# Patient Record
Sex: Female | Born: 1935
Health system: Southern US, Community
[De-identification: ages and names within clinical notes are randomized; demographics above are authoritative.]

## PROBLEM LIST (undated history)

## (undated) DIAGNOSIS — K5289 Other specified noninfective gastroenteritis and colitis: Secondary | ICD-10-CM

## (undated) DIAGNOSIS — F419 Anxiety disorder, unspecified: Secondary | ICD-10-CM

## (undated) DIAGNOSIS — E7800 Pure hypercholesterolemia, unspecified: Secondary | ICD-10-CM

## (undated) DIAGNOSIS — K222 Esophageal obstruction: Secondary | ICD-10-CM

## (undated) DIAGNOSIS — K2990 Gastroduodenitis, unspecified, without bleeding: Secondary | ICD-10-CM

## (undated) DIAGNOSIS — M1990 Unspecified osteoarthritis, unspecified site: Secondary | ICD-10-CM

## (undated) DIAGNOSIS — Z95810 Presence of automatic (implantable) cardiac defibrillator: Secondary | ICD-10-CM

## (undated) DIAGNOSIS — N200 Calculus of kidney: Secondary | ICD-10-CM

## (undated) DIAGNOSIS — R55 Syncope and collapse: Secondary | ICD-10-CM

## (undated) DIAGNOSIS — I739 Peripheral vascular disease, unspecified: Secondary | ICD-10-CM

## (undated) DIAGNOSIS — K644 Residual hemorrhoidal skin tags: Secondary | ICD-10-CM

## (undated) DIAGNOSIS — K2970 Gastritis, unspecified, without bleeding: Secondary | ICD-10-CM

## (undated) DIAGNOSIS — T7840XA Allergy, unspecified, initial encounter: Secondary | ICD-10-CM

## (undated) DIAGNOSIS — H269 Unspecified cataract: Secondary | ICD-10-CM

## (undated) DIAGNOSIS — I7143 Infrarenal abdominal aortic aneurysm, without rupture: Secondary | ICD-10-CM

## (undated) DIAGNOSIS — R001 Bradycardia, unspecified: Secondary | ICD-10-CM

## (undated) DIAGNOSIS — K209 Esophagitis, unspecified without bleeding: Secondary | ICD-10-CM

## (undated) DIAGNOSIS — M549 Dorsalgia, unspecified: Secondary | ICD-10-CM

## (undated) DIAGNOSIS — I4891 Unspecified atrial fibrillation: Secondary | ICD-10-CM

## (undated) DIAGNOSIS — K449 Diaphragmatic hernia without obstruction or gangrene: Secondary | ICD-10-CM

## (undated) DIAGNOSIS — I82409 Acute embolism and thrombosis of unspecified deep veins of unspecified lower extremity: Secondary | ICD-10-CM

## (undated) DIAGNOSIS — M129 Arthropathy, unspecified: Secondary | ICD-10-CM

## (undated) DIAGNOSIS — E119 Type 2 diabetes mellitus without complications: Secondary | ICD-10-CM

## (undated) DIAGNOSIS — H3530 Unspecified macular degeneration: Secondary | ICD-10-CM

## (undated) DIAGNOSIS — K5730 Diverticulosis of large intestine without perforation or abscess without bleeding: Secondary | ICD-10-CM

## (undated) DIAGNOSIS — I219 Acute myocardial infarction, unspecified: Secondary | ICD-10-CM

## (undated) DIAGNOSIS — I951 Orthostatic hypotension: Secondary | ICD-10-CM

## (undated) DIAGNOSIS — I200 Unstable angina: Secondary | ICD-10-CM

## (undated) DIAGNOSIS — D689 Coagulation defect, unspecified: Secondary | ICD-10-CM

## (undated) DIAGNOSIS — N183 Chronic kidney disease, stage 3 unspecified: Secondary | ICD-10-CM

## (undated) DIAGNOSIS — I10 Essential (primary) hypertension: Secondary | ICD-10-CM

## (undated) DIAGNOSIS — I2510 Atherosclerotic heart disease of native coronary artery without angina pectoris: Secondary | ICD-10-CM

## (undated) DIAGNOSIS — K219 Gastro-esophageal reflux disease without esophagitis: Secondary | ICD-10-CM

## (undated) DIAGNOSIS — K625 Hemorrhage of anus and rectum: Secondary | ICD-10-CM

## (undated) DIAGNOSIS — Z8719 Personal history of other diseases of the digestive system: Secondary | ICD-10-CM

## (undated) DIAGNOSIS — G8929 Other chronic pain: Secondary | ICD-10-CM

## (undated) HISTORY — DX: Esophagitis, unspecified without bleeding: K20.90

## (undated) HISTORY — DX: Unspecified cataract: H26.9

## (undated) HISTORY — DX: Allergy, unspecified, initial encounter: T78.40XA

## (undated) HISTORY — DX: Esophagitis, unspecified: K20.9

## (undated) HISTORY — DX: Anxiety disorder, unspecified: F41.9

## (undated) HISTORY — PX: CATARACT EXTRACTION: SUR2

## (undated) HISTORY — DX: Gastroduodenitis, unspecified, without bleeding: K29.90

## (undated) HISTORY — DX: Coagulation defect, unspecified: D68.9

## (undated) HISTORY — DX: Diaphragmatic hernia without obstruction or gangrene: K44.9

## (undated) HISTORY — DX: Chronic kidney disease, stage 3 (moderate): N18.3

## (undated) HISTORY — DX: Type 2 diabetes mellitus without complications: E11.9

## (undated) HISTORY — DX: Gastritis, unspecified, without bleeding: K29.70

## (undated) HISTORY — DX: Diverticulosis of large intestine without perforation or abscess without bleeding: K57.30

## (undated) HISTORY — PX: CARDIAC CATHETERIZATION: SHX172

## (undated) HISTORY — DX: Esophageal obstruction: K22.2

## (undated) HISTORY — PX: HEMORROIDECTOMY: SUR656

## (undated) HISTORY — DX: Chronic kidney disease, stage 3 unspecified: N18.30

## (undated) HISTORY — PX: COLONOSCOPY: SHX174

## (undated) HISTORY — PX: OTHER SURGICAL HISTORY: SHX169

## (undated) HISTORY — DX: Unspecified atrial fibrillation: I48.91

---
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
## 1991-01-17 DIAGNOSIS — I219 Acute myocardial infarction, unspecified: Secondary | ICD-10-CM

## 1991-01-17 HISTORY — DX: Acute myocardial infarction, unspecified: I21.9

## 1998-07-09 ENCOUNTER — Other Ambulatory Visit: Admission: RE | Admit: 1998-07-09 | Discharge: 1998-07-09 | Payer: Self-pay | Admitting: Family Medicine

## 2000-04-20 ENCOUNTER — Encounter: Payer: Self-pay | Admitting: Cardiovascular Disease

## 2000-04-20 ENCOUNTER — Inpatient Hospital Stay (HOSPITAL_COMMUNITY): Admission: EM | Admit: 2000-04-20 | Discharge: 2000-04-23 | Payer: Self-pay | Admitting: Emergency Medicine

## 2000-09-26 ENCOUNTER — Other Ambulatory Visit: Admission: RE | Admit: 2000-09-26 | Discharge: 2000-09-26 | Payer: Self-pay | Admitting: Family Medicine

## 2001-06-12 ENCOUNTER — Encounter: Payer: Self-pay | Admitting: Gastroenterology

## 2001-06-12 ENCOUNTER — Encounter (INDEPENDENT_AMBULATORY_CARE_PROVIDER_SITE_OTHER): Payer: Self-pay

## 2001-06-12 ENCOUNTER — Ambulatory Visit (HOSPITAL_COMMUNITY): Admission: RE | Admit: 2001-06-12 | Discharge: 2001-06-12 | Payer: Self-pay | Admitting: Gastroenterology

## 2001-06-27 ENCOUNTER — Ambulatory Visit (HOSPITAL_COMMUNITY): Admission: RE | Admit: 2001-06-27 | Discharge: 2001-06-27 | Payer: Self-pay | Admitting: Cardiovascular Disease

## 2001-06-27 ENCOUNTER — Encounter: Payer: Self-pay | Admitting: Cardiovascular Disease

## 2001-07-29 ENCOUNTER — Ambulatory Visit (HOSPITAL_COMMUNITY): Admission: RE | Admit: 2001-07-29 | Discharge: 2001-07-30 | Payer: Self-pay | Admitting: Cardiovascular Disease

## 2001-08-21 ENCOUNTER — Ambulatory Visit (HOSPITAL_COMMUNITY): Admission: RE | Admit: 2001-08-21 | Discharge: 2001-08-21 | Payer: Self-pay | Admitting: Internal Medicine

## 2001-09-18 ENCOUNTER — Other Ambulatory Visit: Admission: RE | Admit: 2001-09-18 | Discharge: 2001-09-18 | Payer: Self-pay | Admitting: Family Medicine

## 2001-09-23 ENCOUNTER — Encounter: Payer: Self-pay | Admitting: Family Medicine

## 2001-09-23 ENCOUNTER — Ambulatory Visit (HOSPITAL_COMMUNITY): Admission: RE | Admit: 2001-09-23 | Discharge: 2001-09-23 | Payer: Self-pay | Admitting: Family Medicine

## 2002-06-03 ENCOUNTER — Encounter: Payer: Self-pay | Admitting: Cardiovascular Disease

## 2002-06-03 ENCOUNTER — Ambulatory Visit (HOSPITAL_COMMUNITY): Admission: RE | Admit: 2002-06-03 | Discharge: 2002-06-03 | Payer: Self-pay | Admitting: Cardiovascular Disease

## 2002-09-09 ENCOUNTER — Ambulatory Visit (HOSPITAL_COMMUNITY): Admission: RE | Admit: 2002-09-09 | Discharge: 2002-09-09 | Payer: Self-pay | Admitting: Gastroenterology

## 2002-09-09 ENCOUNTER — Encounter: Payer: Self-pay | Admitting: Gastroenterology

## 2002-10-28 ENCOUNTER — Emergency Department (HOSPITAL_COMMUNITY): Admission: EM | Admit: 2002-10-28 | Discharge: 2002-10-28 | Payer: Self-pay | Admitting: Emergency Medicine

## 2002-10-29 ENCOUNTER — Encounter: Payer: Self-pay | Admitting: Cardiovascular Disease

## 2002-10-29 ENCOUNTER — Inpatient Hospital Stay (HOSPITAL_COMMUNITY): Admission: AD | Admit: 2002-10-29 | Discharge: 2002-10-31 | Payer: Self-pay | Admitting: Cardiovascular Disease

## 2002-12-29 ENCOUNTER — Inpatient Hospital Stay (HOSPITAL_COMMUNITY): Admission: EM | Admit: 2002-12-29 | Discharge: 2003-01-03 | Payer: Self-pay | Admitting: *Deleted

## 2003-11-20 ENCOUNTER — Other Ambulatory Visit: Admission: RE | Admit: 2003-11-20 | Discharge: 2003-11-20 | Payer: Self-pay | Admitting: Family Medicine

## 2003-11-27 ENCOUNTER — Ambulatory Visit (HOSPITAL_COMMUNITY): Admission: RE | Admit: 2003-11-27 | Discharge: 2003-11-28 | Payer: Self-pay | Admitting: Orthopedic Surgery

## 2004-11-08 ENCOUNTER — Ambulatory Visit: Payer: Self-pay | Admitting: Gastroenterology

## 2004-11-09 ENCOUNTER — Ambulatory Visit: Payer: Self-pay | Admitting: Gastroenterology

## 2004-11-22 ENCOUNTER — Ambulatory Visit (HOSPITAL_COMMUNITY): Admission: RE | Admit: 2004-11-22 | Discharge: 2004-11-22 | Payer: Self-pay | Admitting: Gastroenterology

## 2004-11-29 ENCOUNTER — Ambulatory Visit: Payer: Self-pay | Admitting: Gastroenterology

## 2005-01-16 DIAGNOSIS — K5730 Diverticulosis of large intestine without perforation or abscess without bleeding: Secondary | ICD-10-CM

## 2005-01-16 HISTORY — DX: Diverticulosis of large intestine without perforation or abscess without bleeding: K57.30

## 2005-09-06 ENCOUNTER — Ambulatory Visit: Payer: Self-pay | Admitting: Gastroenterology

## 2005-09-20 ENCOUNTER — Ambulatory Visit: Payer: Self-pay | Admitting: Gastroenterology

## 2005-09-20 ENCOUNTER — Encounter (INDEPENDENT_AMBULATORY_CARE_PROVIDER_SITE_OTHER): Payer: Self-pay | Admitting: *Deleted

## 2005-10-31 ENCOUNTER — Ambulatory Visit: Payer: Self-pay | Admitting: Gastroenterology

## 2005-12-18 ENCOUNTER — Other Ambulatory Visit: Admission: RE | Admit: 2005-12-18 | Discharge: 2005-12-18 | Payer: Self-pay | Admitting: Family Medicine

## 2006-11-28 ENCOUNTER — Inpatient Hospital Stay (HOSPITAL_COMMUNITY): Admission: EM | Admit: 2006-11-28 | Discharge: 2006-12-01 | Payer: Self-pay | Admitting: *Deleted

## 2006-11-28 ENCOUNTER — Ambulatory Visit: Payer: Self-pay | Admitting: Internal Medicine

## 2007-01-08 ENCOUNTER — Ambulatory Visit: Payer: Self-pay | Admitting: Internal Medicine

## 2007-02-05 ENCOUNTER — Ambulatory Visit: Payer: Self-pay | Admitting: Internal Medicine

## 2007-02-05 ENCOUNTER — Inpatient Hospital Stay (HOSPITAL_COMMUNITY): Admission: RE | Admit: 2007-02-05 | Discharge: 2007-02-06 | Payer: Self-pay | Admitting: Internal Medicine

## 2007-02-05 HISTORY — PX: CARDIAC DEFIBRILLATOR PLACEMENT: SHX171

## 2007-02-20 ENCOUNTER — Ambulatory Visit: Payer: Self-pay

## 2007-03-20 ENCOUNTER — Ambulatory Visit: Payer: Self-pay | Admitting: Internal Medicine

## 2007-04-04 ENCOUNTER — Ambulatory Visit: Payer: Self-pay

## 2007-05-09 ENCOUNTER — Ambulatory Visit: Payer: Self-pay | Admitting: Internal Medicine

## 2007-07-29 ENCOUNTER — Encounter: Admission: RE | Admit: 2007-07-29 | Discharge: 2007-07-29 | Payer: Self-pay | Admitting: General Surgery

## 2007-11-06 ENCOUNTER — Telehealth: Payer: Self-pay | Admitting: Gastroenterology

## 2007-12-02 DIAGNOSIS — K5289 Other specified noninfective gastroenteritis and colitis: Secondary | ICD-10-CM

## 2007-12-02 DIAGNOSIS — K209 Esophagitis, unspecified without bleeding: Secondary | ICD-10-CM | POA: Insufficient documentation

## 2007-12-02 DIAGNOSIS — K2970 Gastritis, unspecified, without bleeding: Secondary | ICD-10-CM | POA: Insufficient documentation

## 2007-12-02 DIAGNOSIS — E1169 Type 2 diabetes mellitus with other specified complication: Secondary | ICD-10-CM | POA: Insufficient documentation

## 2007-12-02 DIAGNOSIS — K644 Residual hemorrhoidal skin tags: Secondary | ICD-10-CM | POA: Insufficient documentation

## 2007-12-02 DIAGNOSIS — I219 Acute myocardial infarction, unspecified: Secondary | ICD-10-CM | POA: Insufficient documentation

## 2007-12-02 DIAGNOSIS — K2990 Gastroduodenitis, unspecified, without bleeding: Secondary | ICD-10-CM

## 2007-12-02 DIAGNOSIS — K5730 Diverticulosis of large intestine without perforation or abscess without bleeding: Secondary | ICD-10-CM | POA: Insufficient documentation

## 2007-12-02 DIAGNOSIS — M129 Arthropathy, unspecified: Secondary | ICD-10-CM | POA: Insufficient documentation

## 2007-12-02 DIAGNOSIS — E7800 Pure hypercholesterolemia, unspecified: Secondary | ICD-10-CM

## 2007-12-02 HISTORY — DX: Other specified noninfective gastroenteritis and colitis: K52.89

## 2007-12-03 ENCOUNTER — Ambulatory Visit: Payer: Self-pay | Admitting: Gastroenterology

## 2007-12-03 DIAGNOSIS — R1013 Epigastric pain: Secondary | ICD-10-CM

## 2007-12-03 DIAGNOSIS — K625 Hemorrhage of anus and rectum: Secondary | ICD-10-CM

## 2007-12-03 DIAGNOSIS — K580 Irritable bowel syndrome with diarrhea: Secondary | ICD-10-CM | POA: Insufficient documentation

## 2007-12-03 DIAGNOSIS — I259 Chronic ischemic heart disease, unspecified: Secondary | ICD-10-CM | POA: Insufficient documentation

## 2007-12-03 HISTORY — DX: Hemorrhage of anus and rectum: K62.5

## 2007-12-03 LAB — CONVERTED CEMR LAB
ALT: 31 units/L (ref 0–35)
Albumin: 3.9 g/dL (ref 3.5–5.2)
Amylase: 93 units/L (ref 27–131)
BUN: 15 mg/dL (ref 6–23)
CO2: 29 meq/L (ref 19–32)
Calcium: 9.3 mg/dL (ref 8.4–10.5)
Chloride: 103 meq/L (ref 96–112)
Creatinine, Ser: 1.2 mg/dL (ref 0.4–1.2)
Ferritin: 73.8 ng/mL (ref 10.0–291.0)
GFR calc Af Amer: 56 mL/min
Glucose, Bld: 94 mg/dL (ref 70–99)
HCT: 38.6 % (ref 36.0–46.0)
Hemoglobin: 13.4 g/dL (ref 12.0–15.0)
Iron: 77 ug/dL (ref 42–145)
Lymphocytes Relative: 30.8 % (ref 12.0–46.0)
Monocytes Absolute: 0.5 10*3/uL (ref 0.1–1.0)
Neutro Abs: 2.9 10*3/uL (ref 1.4–7.7)
Neutrophils Relative %: 49.7 % (ref 43.0–77.0)
Platelets: 231 10*3/uL (ref 150–400)
RBC: 4.05 M/uL (ref 3.87–5.11)
Sed Rate: 24 mm/hr — ABNORMAL HIGH (ref 0–22)
Sodium: 139 meq/L (ref 135–145)

## 2007-12-04 ENCOUNTER — Encounter: Payer: Self-pay | Admitting: Gastroenterology

## 2007-12-04 ENCOUNTER — Ambulatory Visit: Payer: Self-pay | Admitting: Gastroenterology

## 2007-12-04 ENCOUNTER — Telehealth: Payer: Self-pay | Admitting: Gastroenterology

## 2007-12-09 ENCOUNTER — Encounter: Payer: Self-pay | Admitting: Gastroenterology

## 2008-01-02 ENCOUNTER — Encounter: Payer: Self-pay | Admitting: Gastroenterology

## 2008-01-04 ENCOUNTER — Encounter: Payer: Self-pay | Admitting: Gastroenterology

## 2008-01-06 ENCOUNTER — Encounter: Payer: Self-pay | Admitting: Gastroenterology

## 2008-01-23 ENCOUNTER — Encounter: Admission: RE | Admit: 2008-01-23 | Discharge: 2008-01-23 | Payer: Self-pay | Admitting: General Surgery

## 2008-03-04 ENCOUNTER — Encounter: Payer: Self-pay | Admitting: Internal Medicine

## 2008-03-06 ENCOUNTER — Telehealth: Payer: Self-pay | Admitting: Gastroenterology

## 2008-07-07 ENCOUNTER — Encounter: Admission: RE | Admit: 2008-07-07 | Discharge: 2008-07-07 | Payer: Self-pay | Admitting: Family Medicine

## 2008-07-21 ENCOUNTER — Encounter: Payer: Self-pay | Admitting: Internal Medicine

## 2008-08-05 ENCOUNTER — Encounter: Payer: Self-pay | Admitting: Internal Medicine

## 2009-04-05 ENCOUNTER — Encounter: Admission: RE | Admit: 2009-04-05 | Discharge: 2009-04-05 | Payer: Self-pay | Admitting: Cardiovascular Disease

## 2009-07-12 ENCOUNTER — Encounter: Admission: RE | Admit: 2009-07-12 | Discharge: 2009-07-12 | Payer: Self-pay | Admitting: Family Medicine

## 2009-08-17 ENCOUNTER — Telehealth: Payer: Self-pay | Admitting: Gastroenterology

## 2009-09-10 ENCOUNTER — Telehealth: Payer: Self-pay | Admitting: Gastroenterology

## 2009-12-06 ENCOUNTER — Ambulatory Visit: Payer: Self-pay | Admitting: Internal Medicine

## 2009-12-06 ENCOUNTER — Inpatient Hospital Stay (HOSPITAL_COMMUNITY): Admission: EM | Admit: 2009-12-06 | Discharge: 2009-12-08 | Payer: Self-pay | Admitting: Emergency Medicine

## 2009-12-27 ENCOUNTER — Encounter: Payer: Self-pay | Admitting: Gastroenterology

## 2010-01-04 ENCOUNTER — Encounter (INDEPENDENT_AMBULATORY_CARE_PROVIDER_SITE_OTHER): Payer: Self-pay | Admitting: *Deleted

## 2010-01-24 ENCOUNTER — Telehealth (INDEPENDENT_AMBULATORY_CARE_PROVIDER_SITE_OTHER): Payer: Self-pay | Admitting: *Deleted

## 2010-02-15 NOTE — Progress Notes (Signed)
Summary: ? re: recall col  Phone Note From Other Clinic Call back at [phone]   Caller: Joaquim Lai from Dr Evelene Croon office Call For: Dr Sharlett Iles Summary of Call: Dr Laurance Flatten wants to know when is this patient due for recall co, last one was sone in '07.l  Initial call taken by: Ronalee Red,  August 17, 2009 10:50 AM  Follow-up for Phone Call        Joaquim Lai notified that Dr. Sharlett Iles will not be available until next week to review chart.  Colon and path report in EMR. Follow-up by: Alberteen Spindle RN,  August 17, 2009 11:50 AM  Additional Follow-up for Phone Call Additional follow up Details #1::        only as needed Additional Follow-up by: Sable Feil MD [facility],  August 23, 2009 12:52 PM    Additional Follow-up for Phone Call Additional follow up Details #2::    Mitzi at Dr. Tawanna Sat office notified. Follow-up by: Alberteen Spindle RN,  August 24, 2009 9:43 AM

## 2010-02-15 NOTE — Progress Notes (Signed)
Summary: Refill  Phone Note From Pharmacy   Caller: [facility] Summary of Call: Refill requested on Omeprazole Initial call taken by: Alberteen Spindle RN,  September 10, 2009 4:28 PM  Follow-up for Phone Call        Faxed. Follow-up by: Alberteen Spindle RN,  September 10, 2009 4:29 PM    New/Updated Medications: OMEPRAZOLE 40 MG  CPDR (OMEPRAZOLE) 1 each day 30 minutes before meal Prescriptions: OMEPRAZOLE 40 MG  CPDR (OMEPRAZOLE) 1 each day 30 minutes before meal  #30 x 11   Entered by:   Alberteen Spindle RN   Authorized by:   Sable Feil MD [facility]   Signed by:   Alberteen Spindle RN on 09/10/2009   Method used:   Faxed to ...       [facility]       [address] [place], [place]  [address]       Ph: [phone] or [phone]       Fax: [phone]   RxID:   [id number]

## 2010-02-17 NOTE — Progress Notes (Signed)
Summary: pt calling re letter  Phone Note Call from Patient   Caller: Patient Reason for Call: Talk to Nurse Summary of Call: pt CALLING in response to delquient letter re device ck-pt being checked be dr Rollene Fare Initial call taken by: Lorenda Hatchet,  January 24, 2010 2:27 PM  Follow-up for Phone Call        Now follows with Dr Rollene Fare     Follow-up by: Gasper Sells, EMT,  February 02, 2010 11:37 AM

## 2010-02-17 NOTE — Letter (Signed)
Summary: Device-Delinquent Check  [place], [place] [address] [place]   [place], [place] [address]   Phone: [phone]  Fax: [phone]     January 04, 2010 MRN: [id number]   [place], [place]  [address]   Dear [name],  According to our records, you have not had your implanted device checked in the recommended period of time.  We are unable to determine appropriate device function without checking your device on a regular basis.  Please call our office to schedule an appointment, with Dr [name], as soon as possible.  If you are having your device checked by another physician, please call us so that we may update our records.  Thank you,  Gasper Sells, EMT  January 04, 2010 12:19 PM  [facility]

## 2010-02-28 ENCOUNTER — Encounter: Payer: Self-pay | Admitting: Internal Medicine

## 2010-02-28 ENCOUNTER — Encounter: Payer: Self-pay | Admitting: Gastroenterology

## 2010-03-08 ENCOUNTER — Ambulatory Visit (INDEPENDENT_AMBULATORY_CARE_PROVIDER_SITE_OTHER): Payer: Medicare Other | Admitting: Gastroenterology

## 2010-03-08 ENCOUNTER — Encounter: Payer: Self-pay | Admitting: Gastroenterology

## 2010-03-08 ENCOUNTER — Other Ambulatory Visit: Payer: Self-pay | Admitting: Gastroenterology

## 2010-03-08 ENCOUNTER — Encounter (INDEPENDENT_AMBULATORY_CARE_PROVIDER_SITE_OTHER): Payer: Self-pay | Admitting: *Deleted

## 2010-03-08 ENCOUNTER — Other Ambulatory Visit: Payer: Medicare Other

## 2010-03-08 DIAGNOSIS — K5289 Other specified noninfective gastroenteritis and colitis: Secondary | ICD-10-CM

## 2010-03-08 DIAGNOSIS — K219 Gastro-esophageal reflux disease without esophagitis: Secondary | ICD-10-CM

## 2010-03-08 DIAGNOSIS — R109 Unspecified abdominal pain: Secondary | ICD-10-CM

## 2010-03-08 DIAGNOSIS — R1013 Epigastric pain: Secondary | ICD-10-CM

## 2010-03-08 DIAGNOSIS — K222 Esophageal obstruction: Secondary | ICD-10-CM

## 2010-03-08 LAB — CBC WITH DIFFERENTIAL/PLATELET
Basophils Absolute: 0 10*3/uL (ref 0.0–0.1)
Basophils Relative: 0.3 % (ref 0.0–3.0)
Eosinophils Relative: 8.7 % — ABNORMAL HIGH (ref 0.0–5.0)
Hemoglobin: 12.9 g/dL (ref 12.0–15.0)
Lymphocytes Relative: 34.7 % (ref 12.0–46.0)
MCHC: 33.8 g/dL (ref 30.0–36.0)
MCV: 95.1 fl (ref 78.0–100.0)
Monocytes Relative: 8 % (ref 3.0–12.0)
Neutrophils Relative %: 48.3 % (ref 43.0–77.0)
Platelets: 220 10*3/uL (ref 150.0–400.0)
RDW: 14.2 % (ref 11.5–14.6)

## 2010-03-08 LAB — BASIC METABOLIC PANEL
BUN: 24 mg/dL — ABNORMAL HIGH (ref 6–23)
Calcium: 9.3 mg/dL (ref 8.4–10.5)
GFR: 41.91 mL/min — ABNORMAL LOW (ref >60.00)
Glucose, Bld: 85 mg/dL (ref 70–99)

## 2010-03-08 LAB — IGA: IgA: 241 mg/dL (ref 68–378)

## 2010-03-08 LAB — AMYLASE: Amylase: 60 U/L (ref 27–131)

## 2010-03-08 LAB — VITAMIN B12: Vitamin B-12: 434 pg/mL (ref 211–911)

## 2010-03-08 LAB — IBC PANEL
Iron: 103 ug/dL (ref 42–145)
Saturation Ratios: 32.1 % (ref 20.0–50.0)
Transferrin: 229 mg/dL (ref 212.0–360.0)

## 2010-03-08 LAB — LIPASE: Lipase: 27 U/L (ref 11.0–59.0)

## 2010-03-08 LAB — FERRITIN: Ferritin: 103.7 ng/mL (ref 10.0–291.0)

## 2010-03-08 LAB — FOLATE: Folate: >24.8 ng/mL (ref >5.9)

## 2010-03-08 LAB — HEPATIC FUNCTION PANEL: ALT: 25 U/L (ref 0–35)

## 2010-03-14 ENCOUNTER — Other Ambulatory Visit: Payer: Self-pay | Admitting: Gastroenterology

## 2010-03-14 ENCOUNTER — Encounter (AMBULATORY_SURGERY_CENTER): Payer: Medicare Other | Admitting: Gastroenterology

## 2010-03-14 ENCOUNTER — Encounter: Payer: Self-pay | Admitting: Gastroenterology

## 2010-03-14 DIAGNOSIS — K5289 Other specified noninfective gastroenteritis and colitis: Secondary | ICD-10-CM

## 2010-03-14 DIAGNOSIS — R1310 Dysphagia, unspecified: Secondary | ICD-10-CM

## 2010-03-14 DIAGNOSIS — K219 Gastro-esophageal reflux disease without esophagitis: Secondary | ICD-10-CM

## 2010-03-14 DIAGNOSIS — K2990 Gastroduodenitis, unspecified, without bleeding: Secondary | ICD-10-CM

## 2010-03-14 DIAGNOSIS — R197 Diarrhea, unspecified: Secondary | ICD-10-CM

## 2010-03-14 DIAGNOSIS — K209 Esophagitis, unspecified: Secondary | ICD-10-CM

## 2010-03-14 DIAGNOSIS — K2970 Gastritis, unspecified, without bleeding: Secondary | ICD-10-CM

## 2010-03-15 LAB — HELICOBACTER PYLORI SCREEN-BIOPSY: UREASE: NEGATIVE

## 2010-03-15 NOTE — Letter (Addendum)
Summary: Rebecca Eaton MD  Rebecca Eaton MD   Imported By: Bubba Hales 03/10/2010 08:48:21  _____________________________________________________________________  External Attachment:    Type:   Image     Comment:   External Document

## 2010-03-15 NOTE — Letter (Signed)
Summary: [facility] Instructions  [facility]  [place], [place] [address]   Phone: [phone]  Fax: [phone]       AFIFA NESSER    23-Jan-1931    MRN: [id number]        Procedure Day /Date: Monday 03/14/2010       Arrival Time: 2:00pm     Procedure Time: 3:00pm     Location of Procedure:                    X  [place] (4th Floor)                        [place]   Starting 5 days prior to your procedure 03/09/2010 do not eat nuts, seeds, popcorn, corn, beans, peas,  salads, or any raw vegetables.  Do not take any fiber supplements (e.g. Metamucil, Citrucel, and Benefiber).  THE DAY BEFORE YOUR PROCEDURE         Sunday 03/13/2010  1.  Drink clear liquids the entire day-NO SOLID FOOD  2.  Do not drink anything colored red or purple.  Avoid juices with pulp.  No orange juice.  3.  Drink at least 64 oz. (8 glasses) of fluid/clear liquids during the day to prevent dehydration and help the prep work efficiently.  CLEAR LIQUIDS INCLUDE: Water Jello Ice Popsicles Tea (sugar ok, no milk/cream) Powdered fruit flavored drinks Coffee (sugar ok, no milk/cream) Gatorade Juice: apple, white grape, white cranberry  Lemonade Clear bullion, consomm, broth Carbonated beverages (any kind) Strained chicken noodle soup Hard Candy                             4.  In the morning, mix first dose of MoviPrep solution:    Empty 1 Pouch A and 1 Pouch B into the disposable container    Add lukewarm drinking water to the top line of the container. Mix to dissolve    Refrigerate (mixed solution should be used within 24 hrs)  5.  Begin drinking the prep at 5:00 p.m. The MoviPrep container is divided by 4 marks.   Every 15 minutes drink the solution down to the next mark (approximately 8 oz) until the full liter is complete.   6.  Follow completed prep with 16 oz of clear liquid of your choice (Nothing red or  purple).  Continue to drink clear liquids until bedtime.  7.  Before going to bed, mix second dose of MoviPrep solution:    Empty 1 Pouch A and 1 Pouch B into the disposable container    Add lukewarm drinking water to the top line of the container. Mix to dissolve    Refrigerate  THE DAY OF YOUR PROCEDURE      Monday 03/14/2010  Beginning at 10:00am (5 hours before procedure):         1. Every 15 minutes, drink the solution down to the next mark (approx 8 oz) until the full liter is complete.  2. Follow completed prep with 16 oz. of clear liquid of your choice.    3. You may drink clear liquids until 12:00pm (2 HOURS BEFORE PROCEDURE).   MEDICATION INSTRUCTIONS  Unless otherwise instructed, you should take regular prescription medications with a small sip of water   as early as possible the morning of your procedure.  Stay on your Plavix per  Dr. Sharlett Iles         OTHER INSTRUCTIONS  You will need a responsible adult at least 75 years of age to accompany you and drive you home.   This person must remain in the waiting room during your procedure.  Wear loose fitting clothing that is easily removed.  Leave jewelry and other valuables at home.  However, you may wish to bring a book to read or  an iPod/MP3 player to listen to music as you wait for your procedure to start.  Remove all body piercing jewelry and leave at home.  Total time from sign-in until discharge is approximately 2-3 hours.  You should go home directly after your procedure and rest.  You can resume normal activities the  day after your procedure.  The day of your procedure you should not:   Drive   Make legal decisions   Operate machinery   Drink alcohol   Return to work  You will receive specific instructions about eating, activities and medications before you leave.    The above instructions have been reviewed and explained to me by   _______________________    I fully understand and  can verbalize these instructions _____________________________ Date _________

## 2010-03-15 NOTE — Assessment & Plan Note (Signed)
Summary: Renee Pain and Cramping   History of Present Illness Visit Type: Follow-up Visit Primary GI MD: Verl Blalock MD FACP FAGA Primary Provider: Providence Crosby Requesting Provider: Redge Gainer, MD Chief Complaint: Nausea after meals and at bedtime History of Present Illness:   very pleasant 77 year old Caucasian female with known lymphocytic colitis treated several years ago. She now is having recurrence of her diarrhea, crampy Renee pain, gas and bloating. She also has worsening acid reflux, solid food dysphagia, and postprandial nausea. She has nocturnal acid reflux with possible aspiration. Her cardiologist is Dr. Terance Ice history atrial fibrillation, Renee patient does have a pacemaker in place.  She'll metaprolol 40 mg daily but continues with rather typical regurgitation and burning substernal chest pain. Her symptoms seem to be worse at bedtime. She is currently on Plavix 75 mg a day, but denies rectal bleeding or melena. She had flexible sigmoidoscopy several years ago and endoscopy. Distal diagnoses include external hemorrhoids, diverticulosis, history of kidney stones, hypercholesterolemia, and previous MI.    GI Review of Systems    Reports bloating, nausea, and  vomiting.      Denies Renee pain, acid reflux, belching, chest pain, dysphagia with liquids, dysphagia with solids, heartburn, loss of appetite, vomiting blood, weight loss, and  weight gain.      Reports diarrhea.     Denies anal fissure, black tarry stools, change in bowel habit, constipation, diverticulosis, fecal incontinence, heme positive stool, hemorrhoids, irritable bowel syndrome, jaundice, light color stool, liver problems, rectal bleeding, and  rectal pain.    Current Medications (verified): 1)  Metoprolol Tartrate 50 Mg Tabs (Metoprolol Tartrate) .... Take One By Mouth Two Times A Day 2)  Ativan 0.5 Mg Tabs (Lorazepam) .... Take One By Mouth Once Daily 3)  Citrucel 500 Mg Tabs  (Methylcellulose (Laxative)) .... Take One By Mouth Once Daily 4)  Vitamin D 1000 Unit Tabs (Cholecalciferol) .... Take Two By Mouth Once Daily 5)  Paxil 20 Mg Tabs (Paroxetine Hcl) .... Take One By Mouth Once Daily 6)  Demadex 20 Mg Tabs (Torsemide) .... Take One By Mouth Once Daily As Needed 7)  Norpace Cr 100 Mg Xr12h-Cap (Disopyramide Phosphate) .... 2 Tablets Two Times A Day 8)  Crestor 10 Mg Tabs (Rosuvastatin Calcium) .Marland Kitchen.. 1 Tablet By Mouth Once Daily 9)  Aspirin 81 Mg  Tabs (Aspirin) .Marland Kitchen.. 1 Tablet By Mouth Once Daily 10)  Omeprazole 40 Mg  Cpdr (Omeprazole) .Marland Kitchen.. 1 Each Day 30 Minutes Before Meal 11)  Zestril 10 Mg Tabs (Lisinopril) .... Take One By Mouth Once Daily 12)  Plavix 75 Mg Tabs (Clopidogrel Bisulfate) .Marland Kitchen.. 1 By Mouth Once Daily  Allergies (verified): 1)  ! * Ivp Dye  Past History:  Family History: Last updated: 12/03/2007 No FH of Colon Cancer: Family History of Heart Disease: brother Family History of Uterine Cancer:Daughter  Social History: Last updated: 12/02/2007 Occupation: retired from [employer] with four children Patient has never smoked.  Alcohol Use - no Illicit Drug Use - no  Past medical, surgical, family and social histories (including risk factors) reviewed for relevance to current acute and chronic problems.  Past Medical History: Reviewed history from 12/02/2007 and no changes required. Current Problems:  RENAL CALCULUS (ICD-592.0) ARTHRITIS (ICD-716.90) HYPERCHOLESTEROLEMIA (ICD-272.0) HEART ATTACK (ICD-410.90) GASTRITIS (ICD-535.50) ESOPHAGITIS (ICD-530.10) EXTERNAL HEMORRHOIDS (ICD-455.3) COLITIS (ICD-558.9) DIVERTICULOSIS, COLON (ICD-562.10)  Past Surgical History: Hemorrhoid surgery angioplasty Defibrillator placement  Family History: Reviewed history from 12/03/2007 and no changes required. No FH of Colon Cancer: Family History  of Heart Disease: brother Family History of Uterine Cancer:Daughter  Social  History: Reviewed history from 12/02/2007 and no changes required. Occupation: retired from [employer] with four children Patient has never smoked.  Alcohol Use - no Illicit Drug Use - no  Review of Systems       Renee patient complains of back pain.  Renee patient denies allergy/sinus, anemia, anxiety-new, arthritis/joint pain, blood in urine, breast changes/lumps, change in vision, confusion, cough, coughing up blood, depression-new, fainting, fatigue, fever, headaches-new, hearing problems, heart murmur, heart rhythm changes, itching, menstrual pain, muscle pains/cramps, night sweats, nosebleeds, pregnancy symptoms, shortness of breath, skin rash, sleeping problems, sore throat, swelling of feet/legs, swollen lymph glands, thirst - excessive , urination - excessive , urination changes/pain, urine leakage, vision changes, and voice change.    Vital Signs:  Patient profile:   77 year old female Height:      64 inches Weight:      188 pounds BMI:     32.39 BSA:     1.91 Pulse rate:   62 / minute Pulse rhythm:   regular BP sitting:   118 / 82  (left arm)  Vitals Entered By: [place] [name] (March 08, 2010 11:41 AM)  Physical Exam  General:  Well developed, well nourished, no acute distress. Head:  Normocephalic and atraumatic. Eyes:  PERRLA, no icterus. Lungs:  Clear throughout to auscultation. Heart:  irregular rhythm:.  no significant murmurs gallops or rubs noted. Abdomen:  Soft, nontender and nondistended. No masses, hepatosplenomegaly or hernias noted. Normal bowel sounds.No Evidence of ascites, but she does have a fullness and tenderness in Renee right midabdominal area. Rectal:  Inspection showed ectasias or fistulae, or rectal exam shows marked tenderness with soft stool present which is guaiac negative. Msk:  Symmetrical with no gross deformities. Normal posture. Extremities:  No clubbing, cyanosis, edema or deformities noted. Neurologic:  Alert and  oriented  x4;  grossly normal neurologically. Cervical Nodes:  No significant cervical adenopathy. Psych:  Alert and cooperative. Normal mood and affect.   Impression & Recommendations:  Problem # 1:  DIARRHEA (ICD-787.91) Assessment Deteriorated Probable recurrence of her lymphocytic colitis. Colonoscopy has been scheduled with multiple colon biopsies, in Renee interim, I have restarted Entocort 9 mg a day along with a low roughage diet.  Problem # 2:  ESOPHAGITIS (ICD-530.10) Assessment: Deteriorated followup endoscopy and possible dilatation schedule. At Renee time of exam we will check her for celiac disease and H. pylori infection. I have increased her omeprazole 40 mg twice a day and I prescribed Carafate suspension 1 tablespoon after meals and at bedtime as needed. Standard anti-reflex maneuvers  reviewed with Renee patient.  Problem # 3:  CORONARY ARTERY DISEASE, S/P PTCA (ICD-414.9) Assessment: Unchanged Continue other medications per cardiology and we will do her procedures on Plavix therapy.  Other Orders: TLB-CBC Platelet - w/Differential (85025-CBCD) TLB-BMP (Basic Metabolic Panel-BMET) (99991111) TLB-Hepatic/Liver Function Pnl (80076-HEPATIC) TLB-TSH (Thyroid Stimulating Hormone) (84443-TSH) TLB-B12, Serum-Total ONLY [id number]) TLB-Ferritin (AB-123456789) TLB-Folic Acid (Folate) (XX123456) TLB-IBC Pnl (Iron/FE;Transferrin) (83550-IBC) TLB-Amylase (82150-AMYL) TLB-Lipase (83690-LIPASE) TLB-IgA (Immunoglobulin A) (82784-IGA) T-Sprue Panel (Celiac Disease Aby Eval) (83516x3/[address]) Colon/Endo (Colon/Endo) Ultrasound Abdomen (UAS)  Patient Instructions: 1)  Copy sent to : Redge Gainer, MD and Dr. Terance Ice At [facility]. 2)  Your procedure has been scheduled for 03/14/2010, please follow Renee seperate instructions.  3)  [facility] Patient Information Guide given to patient.  4)  Colonoscopy and Flexible Sigmoidoscopy brochure  given.  5)  Upper Endoscopy brochure given.  6)  Please go to Renee basement today for your labs.  7)  Your prescription(s) have been sent to you pharmacy.  8)  Your Renee ultrasound is schedule for 03/18/2010, please follow seperate instructions.  9)  Renee medication list was reviewed and reconciled.  All changed / newly prescribed medications were explained.  A complete medication list was provided to Renee patient / caregiver. 10)  Avoid foods high in acid content ( tomatoes, citrus juices, spicy foods) . Avoid eating within 3 to 4 hours of lying down or before exercising. Do not over eat; try smaller more frequent meals. Elevate head of bed four inches when sleeping.  11)  Advised to stick with a low residue diet  avoiding food that can irritate bowel (see handout).  Prescriptions: MOVIPREP 100 GM  SOLR (PEG-KCL-NACL-NASULF-NA ASC-C) As per prep instructions.  #1 x 0   Entered by:   Bernita Buffy CMA ([place])   Authorized by:   Sable Feil MD [facility]   Signed by:   Bernita Buffy CMA ([place]) on 03/08/2010   Method used:   Faxed to ...       [facility]       [place], [place]  [address]       Ph: [phone] or [phone]       Fax: [phone]   RxID:   [id number] ENTOCORT EC 3 MG XR24H-CAP (BUDESONIDE) take three by mouth once daily  #90 x 3   Entered by:   Bernita Buffy CMA ([place])   Authorized by:   Sable Feil MD [facility]   Signed by:   Bernita Buffy CMA ([place]) on 03/08/2010   Method used:   Faxed to ...       [facility]       [place], [place]  [address]       Ph: [phone] or [phone]       Fax: [phone]   RxID:   [id number] CARAFATE 1 GM/10ML SUSP (SUCRALFATE) take 10 ML after meals and at bedtime  #1 month x 6   Entered by:   Bernita Buffy CMA ([place])   Authorized by:   Sable Feil MD [facility]   Signed by:   Bernita Buffy CMA ([place]) on 03/08/2010   Method used:   Faxed to ...       [facility]       [place], [place]  [address]       Ph: [phone] or [phone]       Fax: [phone]   RxID:   [id number] OMEPRAZOLE 40 MG  CPDR (OMEPRAZOLE) take one by mouth two times a day  #60 x 3   Entered by:   Bernita Buffy CMA ([place])   Authorized by:   Sable Feil MD [facility]   Signed by:   Bernita Buffy CMA ([place]) on 03/08/2010   Method used:   Faxed to ...       [facility]       [address]  [place], [place]  [address]       Ph: [phone] or [phone]       Fax: [phone]   RxID:   [id number]

## 2010-03-16 ENCOUNTER — Telehealth: Payer: Self-pay | Admitting: Gastroenterology

## 2010-03-18 ENCOUNTER — Ambulatory Visit (HOSPITAL_COMMUNITY)
Admission: RE | Admit: 2010-03-18 | Discharge: 2010-03-18 | Disposition: A | Discharge status: Home / Self Care | Payer: Medicare Other | Source: Ambulatory Visit | Attending: Gastroenterology | Admitting: Gastroenterology

## 2010-03-18 ENCOUNTER — Encounter: Payer: Self-pay | Admitting: Gastroenterology

## 2010-03-18 DIAGNOSIS — R1013 Epigastric pain: Secondary | ICD-10-CM | POA: Insufficient documentation

## 2010-03-18 DIAGNOSIS — R109 Unspecified abdominal pain: Secondary | ICD-10-CM

## 2010-03-22 ENCOUNTER — Encounter: Payer: Self-pay | Admitting: Gastroenterology

## 2010-03-24 ENCOUNTER — Other Ambulatory Visit (HOSPITAL_COMMUNITY): Payer: Self-pay | Admitting: Surgery

## 2010-03-24 ENCOUNTER — Telehealth: Payer: Self-pay | Admitting: Gastroenterology

## 2010-03-24 DIAGNOSIS — R1310 Dysphagia, unspecified: Secondary | ICD-10-CM

## 2010-03-24 DIAGNOSIS — R1319 Other dysphagia: Secondary | ICD-10-CM | POA: Insufficient documentation

## 2010-03-24 NOTE — Procedures (Addendum)
Summary: Colonoscopy  Patient: [name] Note: All result statuses are Final unless otherwise noted.  Tests: (1) Colonoscopy (COL)   COL Colonoscopy           [place] [facility]     [place], [place]  [address]          COLONOSCOPY PROCEDURE REPORT          PATIENT:  Renee, [name]  MR#:  [id number]     BIRTHDATE:  [birth year], 78 yrs. old  GENDER:  female     ENDOSCOPIST:  Loralee Pacas. Sharlett Iles, MD, [facility]     REF. BY:  Redge Gainer, M.D.     PROCEDURE DATE:  03/14/2010     PROCEDURE:  Colonoscopy with biopsy     ASA CLASS:  Class III     INDICATIONS:  unexplained diarrhea HX OF LYMPHOCYTIC COLITIS.     MEDICATIONS:   Fentanyl 50 mcg IV, Versed 7 mg IV          DESCRIPTION OF PROCEDURE:   After the risks benefits and     alternatives of the procedure were thoroughly explained, informed     consent was obtained.  Digital rectal exam was performed and     revealed no abnormalities.   The LB [id number] endoscope     was introduced through the anus and advanced to the cecum, which     was identified by both the appendix and ileocecal valve, limited     by a redundant colon.    The quality of the prep was good, using     MoviPrep.  The instrument was then slowly withdrawn as the colon     was fully examined.     <<PROCEDUREIMAGES>>          FINDINGS:  No polyps or cancers were seen.  Otherwise normal     colonoscopy without other polyps, masses, vascular ectasias, or     inflammatory changes. RANDOM BIOPSIES EVERY 10 CM. DONE.     Retroflexed views in the rectum revealed no abnormalities.    The     scope was then withdrawn from the patient and the procedure     completed.          COMPLICATIONS:  None     ENDOSCOPIC IMPRESSION:     1) No polyps or cancers     2) Otherwise nl colonoscopy WMO     R/O MICROSCOPIC/COLLAGENOUS COLITIS.     RECOMMENDATIONS:     1) Await pathology results     2) Upper Endoscopy     REPEAT EXAM:  No           ______________________________     Loralee Pacas. Sharlett Iles, MD, [facility]          CC:          n.     eSIGNED:   Loralee Pacas. Jamea Robicheaux at 03/14/2010 04:36 PM          [place], [id number]  Note: An exclamation mark (!) indicates a result that was not dispersed into the flowsheet. Document Creation Date: 03/14/2010 4:36 PM _______________________________________________________________________  (1) Order result status: Final Collection or observation date-time: 03/14/2010 16:20 Requested date-time:  Receipt date-time:  Reported date-time:  Referring Physician:   Ordering Physician: Verl Blalock [phone]) Specimen Source:  Source: Tawanna Cooler Order Number: [id number] Lab site:

## 2010-03-24 NOTE — Miscellaneous (Signed)
Summary: Orders Update Clo Test  Clinical Lists Changes  Orders: Added new Test order of TLB-H Pylori Screen Gastric Biopsy (83013-CLOTEST) - Signed

## 2010-03-24 NOTE — Progress Notes (Signed)
Summary: Speak with nurse  Phone Note Call from Patient Call back at Home Phone [phone]   Caller: Patient Call For: Dr. Sharlett Iles Reason for Call: Talk to Nurse Summary of Call: Patient had double procedure on monday and she is scheduled for UAS at [facility] and wants to know if we still recommend that she have the UAS since we did not find any different findings during procedure Initial call taken by: Martinique Johnson,  March 16, 2010 10:52 AM  Follow-up for Phone Call        what is a uas ??? Follow-up by: Sable Feil MD [facility],  March 16, 2010 11:46 AM  Additional Follow-up for Phone Call Additional follow up Details #1::        It's an Abdominal U/S but the order screen shows it a UAS.Shella Maxim RN  March 16, 2010 11:54 AM     Additional Follow-up for Phone Call Additional follow up Details #2::    yes  do ultrasound Follow-up by: Sable Feil MD Katrine Coho 12:24 PM  Additional Follow-up for Phone Call Additional follow up Details #3:: Details for Additional Follow-up Action Taken: Notified patient Dr Sharlett Iles would like for her to have the U/S; patient stated understanding. Additional Follow-up by: Shella Maxim RN,  March 16, 2010 1:09 PM

## 2010-03-24 NOTE — Procedures (Addendum)
Summary: Upper Endoscopy  Patient: [name] Note: All result statuses are Final unless otherwise noted.  Tests: (1) Upper Endoscopy (EGD)   EGD Upper Endoscopy       [facility] [employer]     [place], [place]  [id number]          ENDOSCOPY PROCEDURE REPORT          PATIENT:  [name], [name]  MR#:  [id number]     BIRTHDATE:  March 12, 1931, 78 yrs. old  GENDER:  female          ENDOSCOPIST:  Loralee Pacas. Sharlett Iles, MD, [facility]     Referred by:  Redge Gainer, M.D.          PROCEDURE DATE:  03/14/2010     PROCEDURE:  EGD with biopsy for H. pylori 43239, Maloney Dilation     of Esophagus     ASA CLASS:  Class III     INDICATIONS:  dysphagia          MEDICATIONS:   There was residual sedation effect present from     prior procedure., Fentanyl 25 mcg IV, Versed 2 mg IV     TOPICAL ANESTHETIC:  Exactacain Spray          DESCRIPTION OF PROCEDURE:   After the risks benefits and     alternatives of the procedure were thoroughly explained, informed     consent was obtained.  The LB GIF-H180 [id number] endoscope was     introduced through the mouth and advanced to the second portion of     the duodenum, without limitations.  The instrument was slowly     withdrawn as the mucosa was fully examined.     <<PROCEDUREIMAGES>>          A hiatal hernia was found. 5 CM HH NOTED.PROBABLE STRICTURE     DILATED WITH #50 MALONEY.NO HEME OR PAIN.  Esophagitis was found     at the gastroesophageal junction. SEE PICTURES.  Moderate     gastritis was found in the body and the antrum of the stomach. CLO     BX. DONE.  The duodenal bulb was normal in appearance, as was the     postbulbar duodenum. SI DONE.    Retroflexed views revealed a     hiatal hernia.    The scope was then withdrawn from the patient     and the procedure completed.          COMPLICATIONS:  None          ENDOSCOPIC IMPRESSION:     1) Hiatal hernia     2) Esophagitis at the gastroesophageal junction     3) Moderate gastritis in the body and the antrum of the stomach          4) Normal duodenum     5) A hiatal hernia     GERD AND STRICTURE DILATED.     RECOMMENDATIONS:     1) Anti-reflux regimen to be follow     2) Await biopsy results     3) Clear liquids until, then soft foods rest iof day. Resume     prior diet tomorrow.     4) Rx CLO if positive     5) continue PPI          REPEAT EXAM:  No          ______________________________     Shanon Brow  Consuello Masse, MD, [facility]          CC:          n.     eSIGNED:   Loralee Pacas. Patterson at 03/14/2010 04:42 PM          [place], [id number]  Note: An exclamation mark (!) indicates a result that was not dispersed into the flowsheet. Document Creation Date: 03/14/2010 4:42 PM _______________________________________________________________________  (1) Order result status: Final Collection or observation date-time: 03/14/2010 16:31 Requested date-time:  Receipt date-time:  Reported date-time:  Referring Physician:   Ordering Physician: Verl Blalock [phone]) Specimen Source:  Source: Tawanna Cooler Order Number: [id number] Lab site:

## 2010-03-28 ENCOUNTER — Telehealth: Payer: Self-pay | Admitting: Gastroenterology

## 2010-03-29 ENCOUNTER — Other Ambulatory Visit (HOSPITAL_COMMUNITY): Payer: Medicare Other

## 2010-03-29 LAB — COMPREHENSIVE METABOLIC PANEL
AST: 31 U/L (ref 0–37)
BUN: 15 mg/dL (ref 6–23)
Calcium: 9.8 mg/dL (ref 8.4–10.5)
GFR calc Af Amer: 54 mL/min — ABNORMAL LOW (ref >60)
Potassium: 3.9 mEq/L (ref 3.5–5.1)
Total Bilirubin: 0.8 mg/dL (ref 0.3–1.2)

## 2010-03-29 LAB — PROTIME-INR
INR: 0.95 (ref 0.00–1.49)
INR: 0.96 (ref 0.00–1.49)
Prothrombin Time: 12.9 s (ref 11.6–15.2)

## 2010-03-29 LAB — CBC
HCT: 38.1 % (ref 36.0–46.0)
HCT: 45.1 % (ref 36.0–46.0)
Hemoglobin: 12.4 g/dL (ref 12.0–15.0)
Hemoglobin: 12.6 g/dL (ref 12.0–15.0)
Hemoglobin: 15 g/dL (ref 12.0–15.0)
MCH: 31.3 pg (ref 26.0–34.0)
MCH: 31.8 pg (ref 26.0–34.0)
MCHC: 33.3 g/dL (ref 30.0–36.0)
MCV: 95.8 fL (ref 78.0–100.0)
MCV: 96.5 fL (ref 78.0–100.0)
MCV: 96.7 fL (ref 78.0–100.0)
Platelets: 221 K/uL (ref 150–400)
RBC: 3.94 MIL/uL (ref 3.87–5.11)
RBC: 4.02 MIL/uL (ref 3.87–5.11)
RBC: 4.71 MIL/uL (ref 3.87–5.11)
RDW: 13.1 % (ref 11.5–15.5)
WBC: 5.7 10*3/uL (ref 4.0–10.5)
WBC: 5.9 10*3/uL (ref 4.0–10.5)

## 2010-03-29 LAB — DIFFERENTIAL
Basophils Absolute: 0 K/uL (ref 0.0–0.1)
Basophils Relative: 1 % (ref 0–1)
Eosinophils Absolute: 0.5 10*3/uL (ref 0.0–0.7)
Eosinophils Relative: 8 % — ABNORMAL HIGH (ref 0–5)
Lymphocytes Relative: 35 % (ref 12–46)
Lymphs Abs: 2 10*3/uL (ref 0.7–4.0)
Monocytes Absolute: 0.6 K/uL (ref 0.1–1.0)
Monocytes Relative: 10 % (ref 3–12)
Neutro Abs: 2.6 10*3/uL (ref 1.7–7.7)
Neutrophils Relative %: 46 % (ref 43–77)

## 2010-03-29 LAB — COMPREHENSIVE METABOLIC PANEL WITH GFR
ALT: 31 U/L (ref 0–35)
Albumin: 3.8 g/dL (ref 3.5–5.2)
Alkaline Phosphatase: 69 U/L (ref 39–117)
CO2: 22 meq/L (ref 19–32)
Chloride: 106 meq/L (ref 96–112)
Creatinine, Ser: 1.18 mg/dL (ref 0.4–1.2)
GFR calc non Af Amer: 45 mL/min — ABNORMAL LOW (ref >60)
Glucose, Bld: 85 mg/dL (ref 70–99)
Sodium: 137 meq/L (ref 135–145)
Total Protein: 7.3 g/dL (ref 6.0–8.3)

## 2010-03-29 LAB — CARDIAC PANEL(CRET KIN+CKTOT+MB+TROPI)
Relative Index: 3.3 — ABNORMAL HIGH (ref 0.0–2.5)
Total CK: 171 U/L (ref 7–177)
Troponin I: 0.01 ng/mL (ref 0.00–0.06)

## 2010-03-29 LAB — CK TOTAL AND CKMB (NOT AT ARMC)
CK, MB: 10.4 ng/mL (ref 0.3–4.0)
Relative Index: 4.1 — ABNORMAL HIGH (ref 0.0–2.5)
Total CK: 253 U/L — ABNORMAL HIGH (ref 7–177)

## 2010-03-29 LAB — TROPONIN I: Troponin I: 0.01 ng/mL (ref 0.00–0.06)

## 2010-03-29 LAB — TSH: TSH: 6.762 u[IU]/mL — ABNORMAL HIGH (ref 0.350–4.500)

## 2010-03-29 LAB — T4, FREE: Free T4: 1.2 ng/dL (ref 0.80–1.80)

## 2010-03-29 LAB — HEMOGLOBIN A1C
Hgb A1c MFr Bld: 6.1 % — ABNORMAL HIGH (ref <5.7)
Mean Plasma Glucose: 128 mg/dL — ABNORMAL HIGH (ref <117)

## 2010-03-29 LAB — HEPARIN LEVEL (UNFRACTIONATED)
Heparin Unfractionated: 0.76 IU/mL — ABNORMAL HIGH (ref 0.30–0.70)
Heparin Unfractionated: 0.76 [IU]/mL — ABNORMAL HIGH (ref 0.30–0.70)

## 2010-03-29 LAB — APTT: aPTT: 26 s (ref 24–37)

## 2010-03-29 LAB — MAGNESIUM: Magnesium: 2.2 mg/dL (ref 1.5–2.5)

## 2010-03-29 LAB — ABO/RH: ABO/RH(D): B POS

## 2010-03-29 NOTE — Letter (Signed)
Summary: [facility]   Imported By: Marilynne Drivers 03/25/2010 10:38:14  _____________________________________________________________________  External Attachment:    Type:   Image     Comment:   External Document

## 2010-03-29 NOTE — Letter (Signed)
Summary: Patient Notice- Colon Biospy Results  [facility]  [address] [place], [place] [address]   Phone: [phone]  Fax: [phone]        March 22, 2010 MRN: [id number]    [place], [place]  [id number]    Dear [name],  I am pleased to inform you that the biopsies taken during your recent colonoscopy did not show any evidence of cancer upon pathologic examination.Biopsies are consistent with collagenous colitis it should respond to Entocort therapy as planned.Small bowel biopsy was normal.  Additional information/recommendations:  __No further action is needed at this time.  Please follow-up with      your primary care physician for your other healthcare needs.  _x_Please call [phone] to schedule a return visit to review      your condition.Please Call for followup appointment in one month's time.  _x_Continue with the treatment plan as outlined on the day of your      exam.  __You should have a repeat colonoscopy examination for this problem           in _ years.  Please call us if you are having persistent problems or have questions about your condition that have not been fully answered at this time.  Sincerely,  Sable Feil MD [facility]   This letter has been electronically signed by your physician.  Appended Document: Patient Notice- Colon Biospy Results letter mailed

## 2010-03-29 NOTE — Progress Notes (Signed)
Summary: Triage  Phone Note Call from Patient Call back at Home Phone [phone]   Caller: Patient Call For: Dr. Sharlett Iles Reason for Call: Talk to Nurse Summary of Call: Having problems swallowing "choking sensation", abd bloating Initial call taken by: Webb Laws,  March 24, 2010 11:08 AM  Follow-up for Phone Call         Patient had ENDO on 03/14/10 with Stricture Dilation showing Hiatal Hernia, Esophagitis and Gastritis. On 03/18/10 she had a basically normal Abdominal U/S. Patient reports trouble swallowing and the sensation her food is not going down. I asked if the problem has gotten worse since her Dilation and she stated she doesn't think it's gotten much better. Pt is taking Carafate which she read can cause constipation and she thinks that is the problem. Patient takes Citracel daily and doesn't want another med to take when I suggested Miralax.  Patient would like to know if it's ok to stop the Carafate and see if her constipation improves and if her food will pass better. Thanks.  Follow-up by: Shella Maxim RN  March 24, 2010 11:45 AM   Additional Follow-up for Phone Call Additional follow up Details #1::        schedule barium swallow with barium pill exam. Additional Follow-up by: Sable Feil MD [facility],  March 24, 2010 1:01 PM  New Problems: DYSPHAGIA (ICD-787.29)   Additional Follow-up for Phone Call Additional follow up Details #2::    Lmom for patient to return our call so I can notify her of the BS.Shella Maxim RN  March 24, 2010 1:25 PM    Spoke with patient and informed her of her BS with Pill- no pre procedure instructions. Patient stated understanding. Follow-up by: Shella Maxim RN,  March 24, 2010 3:37 PM  New Problems: DYSPHAGIA [phone])

## 2010-04-04 ENCOUNTER — Encounter: Payer: Self-pay | Admitting: Vascular Surgery

## 2010-04-05 NOTE — Progress Notes (Signed)
Summary: cx hosp proc.  Phone Note Call from Patient Call back at [facility] Phone [phone]   Caller: Patient Call For: Dr Sharlett Iles Reason for Call: Talk to Nurse Summary of Call: Patient wants to cancel [facility] proc for tomorrow. Initial call taken by: Ronalee Red,  March 28, 2010 10:02 AM  Follow-up for Phone Call        pt states that she is sick, and she wants to cx barrium swallow.  Follow-up by: Bernita Buffy CMA [name],  March 28, 2010 12:32 PM

## 2010-05-31 NOTE — Discharge Summary (Signed)
[name], [name]              ACCOUNT NO.:  [id number]   MEDICAL RECORD NO.:  [id number]          PATIENT TYPE:  INP   LOCATION:  2020                         FACILITY:  [facility]   PHYSICIAN:  Richard A. Rollene Fare, M.D.DATE OF BIRTH:  [birth year]   DATE OF ADMISSION:  11/28/2006  DATE OF DISCHARGE:  12/01/2006                               DISCHARGE SUMMARY   DISCHARGE DIAGNOSES:  1. Sustained ventricular tachycardia.      a.     Medication adjustment and electrophysiology consultation.  2. Recurrent near-syncope, with nonsustained ventricular tachycardia.  3. The patient has implantable cardioverter defibrillator.  4. Hypertension.  5. Coronary disease with history of left anterior descending stent in      July 2003.  6. History of diarrhea.  7. Upper respiratory tract infection.   CONDITION ON DISCHARGE:  Improved.   PROCEDURES:  None.   DISCHARGE MEDICATIONS:  1. Aspirin 81 mg, two daily.  2. Zestril 10 mg daily.  3. Plavix 75 mg daily.  4. Lipitor 30 mg daily.  5. Paxil 20 mg daily.  6. Ativan 0.5 mg daily.  7. Multivitamin 1 daily.  8. Calcium plus D daily.  9. Nexium 40 mg daily.  10.Mucinex 600 mg twice a day for congestion.  11.Lopressor 50 mg every 12 hours, which is a new dose.  12.Norpace CR 200 mg every 12 hours, new dose.   DISCHARGE INSTRUCTIONS:  No restrictions on activity.  Low-sodium heart-  healthy diet.   FOLLOWUP:  With Dr. Rollene Fare.  The office will call the date and time.   HISTORY OF PRESENT ILLNESS:  [name] is a 77 year old white female  who is active and does yard work.  She has known coronary disease with  prior PCI, and last cath in May 2006 with patent LAD and diagonal  stents, with minor RCA disease, and normal LV function.   The patient has a history of idiopathic ventricular tachycardia, and had  her first ICD placed by Dr. Royce Macadamia in 1993 for polymorphic VT, which  appears adrenergically mediated.   She has mini-4 1796  generator now.  She has not had any ICD charges, and  had been active.  Recently, she had upper respiratory infection that is  improving.  [name] had seen Dr. Rollene Fare on November 28, 2006 and  was almost ready to end the visit when she had an episode of  palpitations and mild presyncope.  They reinterrogated her pacemaker ICU  and found another episode of polymorphic nonsustained ventricular  tachycardia.  It was not sustained, the therapy was diverted.  She did  live alone and Dr. Rollene Fare thought that she should be hospitalized for  further evaluation and adjustment of medications.  Please note  previously she has had a V-fib arrest in 1993, with normal course.   She was admitted to [facility].  We added magnesium and potassium  initially, though when she was admitted to the hospital her electrolytes  were normal.  Also, Dr. Rollene Fare wanted to increase her Zestril but  during the hospitalization, because of the increase in dose of beta-  blocker, we left her on the same dose of Zestril she had been on  previously.   She was admitted, saw Dr. [name], and he increased her beta-blocker as  well as her Norpace.  We watched her for 48 hours in the hospital, and  she maintained stability.  Cardiac enzymes were negative, and by  December 01, 2006, she was ready for discharge home.  She did have small  bursts of nonsustained ventricular tachycardia while she was here, but  none in the last 24 hours.   PHYSICAL EXAMINATION AT DISCHARGE:  VITAL SIGNS:  Blood pressure 127/72,  pulse 58, respirations 18.  Temperature 97.9.  Oxygen saturation on room  air is 97%.  GENERAL:  She is alert and oriented, without complaints.  LUNGS:  Clear.  HEART:  Regular rate and rhythm.   DIAGNOSTIC STUDIES:  Chest x-ray on admission:  No acute abnormalities.   Please note, due to her episodes of diarrhea, we did a C.diff and stool  for culture and sensitivity and those results were pending at  discharge.   LABORATORY DATA:  Hemoglobin on admission 13.9, hematocrit 41.2, WBC  5.9, platelets 261.  MCV 93.8, neutrophils 44, and these remained  stable.  Chemistry on admission:  139, potassium 3.9, chloride 107, CO2  27, BUN 12, creatinine 0.97, glucose 84, potassium got up to 4.7 on  supplementation, so we discontinued that, and at discharge sodium was  141, potassium 4.5, chloride 106, CO2 29, BUN 18, creatinine 1.18,  glucose 84.   Coags on admission:  Pro time 12.8, INR 0.9, PTT 25.   LFTs were normal, amylase 67, lipase 29.  Again, done for diarrhea.  Albumin 3.5.   Cardiac enzymes were all negative.  Range 94 to 77.  MB 2.2 to 1.6.  Troponin I was 0.01.   Total cholesterol 150, LDL 44, HDL 46, triglycerides 99.  Magnesium on  admission was 2.3.  She was given supplementation, but this was stopped  before discharge, and calcium was 9.1.   BNP on admission was 239, TSH 3.966.   HOSPITAL COURSE:  The patient did well with increase in medications. S  he had 3-4 bursts of nonsustained ventricular tachycardia on the 12th  and 13th, and the 14th and 15th no documentation of ventricular  tachycardia was seen.  She was felt ready for discharge home by Dr.  Tami Ribas on the 15th, and she will follow up with Dr. Rollene Fare as an  outpatient, and then back to Dr. Gwenlyn Found.   Please note when Dr. Gwenlyn Found did talk to her during the hospitalization,  she had stated that she had had some chest discomfort so the plan in the  hospital had also been if she continues without sustained v-tach then we  would pursue cardiac catheterization here, but that was not the case, so  the patient was discharged.      Otilio Carpen. [place], N.P.      Richard A. Rollene Fare, M.D.  Electronically Signed    LRI/MEDQ  D:  12/01/2006  T:  12/01/2006  Job:  [id number]   cc:   Delfino Lovett A. Rollene Fare, M.D.  Chipper Herb, M.D.  Champ Mungo. [name], MD  Quay Burow, M.D.

## 2010-05-31 NOTE — Assessment & Plan Note (Signed)
[facility] OFFICE NOTE   [name], [name]                     MRN:          [id number]  DATE:05/09/2007                            DOB:          1931-03-11    HISTORY OF PRESENT ILLNESS:  [name] returns today for follow-up.  She is a very pleasant, elderly woman with a history of mixed  cardiomyopathy, ventricular tachycardia and hypertension who is status  post ICD implantation back in January.  The patient had a longstanding,  greater than 75 year old, epicardial system placed in the left upper  quadrant of her abdomen, and this site was abandoned with new  implantation of left clavicular region.  Her old ICD was removed and her  leads were capped.  The patient, post procedure, developed a small  stitch abscess in her abdominal area which has healed nicely.  She also  apparently developed swelling in her left arm consistent with left arm  DVT, and for this reason, underwent Coumadin therapy.  She returns today  for follow-up.  Since she started on the Coumadin, she developed fatigue  and weak and just overall unwell.  She denies chest pain.  She denies  shortness of breath.  She denies peripheral edema.  Her other complaint  today is that she has continued to have problems with palpitations.  She  had a specific episode several weeks ago where she felt like her heart  raced, and this did not last more than about a minute or two.  She did  not receive any ICD therapy.   CURRENT MEDICATIONS:  1. Coumadin as directed,.  2. Toprol 50 twice a day.  3. Ativan 0.5 daily p.r.n.  4. Multiple vitamins.  5. Zestril 10 mg daily.  6. Norpace 150 mg twice daily.  7. Plavix 75 a day.  8. Lipitor 40 a day.  9. Paxil 20 a day.   PHYSICAL EXAMINATION:  GENERAL:  She is a pleasant elderly woman in no  distress.  VITAL SIGNS:  Blood pressure was 143/77, pulse was 60 and regular,  respirations were 18, weight  was 191 pounds.  NECK:  Revealed no jugular distention.  There is no thyromegaly.  LUNGS:  Clear bilaterally to auscultation.  No wheezes, rales or rhonchi  are present.  CARDIOVASCULAR:  Regular rate and rhythm.  Normal S1-S2.  EXTREMITIES:  Demonstrated no left upper extremity edema.  Her ICD  insertion sites were healed nicely.   Interrogation of her defibrillator demonstrates a Guidant T-135.  The R-  waves were 12.  The impedance 706, threshold 0.8 at 0.5.  The battery  voltage was 3.22 volts.  There were no intercurrent IC therapies.  She  was not doing any V pacing.   IMPRESSION:  1. History of VT  2. Status post ICD  3. Mixed cardiomyopathy.  4. Hypertension.  5. Left arm DVT now resolved.   DISCUSSION:  [name] is stable.  Today, I have recommended that she  stop her Coumadin on May 1.  She will continue her other medications.  We reprogrammed her device today, changing her from  a three zone therapy  to two zones of therapy with one zone of monitoring.  I wonder if her  palpitations and dizzy spells are related to slower VT.  Hopefully, we  can catch this.  I will see the patient back in several months.     Champ Mungo. Renee Le, MD  Electronically Signed    GWT/MedQ  DD: 05/09/2007  DT: 05/09/2007  Job #: [id number]   cc:   [name], M.D.

## 2010-05-31 NOTE — Op Note (Signed)
NAMEPEARLEAN, SOLAND NO.:  [id number]   MEDICAL RECORD NO.:  [id number]          PATIENT TYPE:  INP   LOCATION:  2807                         FACILITY:  [facility]   PHYSICIAN:  Champ Mungo. [name], MD    DATE OF BIRTH:  1931-07-26   DATE OF PROCEDURE:  02/05/2007  DATE OF DISCHARGE:                               OPERATIVE REPORT   PROCEDURE PERFORMED:  Implantation of a new single chamber ICD, followed  by removal of an old abdominal implanted ICD for which there was ICD  lead dysfunction.   INTRODUCTION:  The patient is a 77 year old woman with a history of  ventricular tachycardia secondary to repetitive monomorphic VT with  syncope.  She is status post ICD insertion initially back in 1993.  She  has been fairly stable in the past.  Unfortunately, her defibrillator  lead including the rate sense lead showed evidence of malfunction.  The  patient had a prior end of endovascular lead placed in 1993 and the  impedance measurements were quite elevated on this lead and also had a  rate sense lead placed several years ago with very low R-waves.  For all  the above reasons, she is now referred for insertion of a new device in  a new location with removal of her old device.   PROCEDURE:  After informed consent was obtained, the patient was taken  to the diagnostic EP lab in the fasting state.  After usual preparation  and draping, intravenous fentanyl and midazolam was given for sedation.  There was 30 mL of lidocaine infiltrated in the left infraclavicular  region.  A 7 cm incision was carried out over this region.  Electrocautery was utilized to dissect down to the fascial plane.  The  left subclavian vein was subsequently punctured.  It was subtotally  occluded.  A slick wire, however, was utilized to traverse the subtotal  occlusion and be advanced into the vena cava.  Utilizing a series of 6-  [place], followed by 8-French, followed by 9-French dilators, the vein  was  dilated up and the Guidant model [id number] active fixation  defibrillation lead was advanced into the right ventricle.  Mapping was  carried out.  It should be noted that the R-waves were decreased  throughout.  At a location between the old defibrillator lead which was  in the RV apex and the new rate sense lead which was on the RV septum, a  location was found which was satisfactory utilizing R-waves between 8  and 12 mV.  The pace impedance was 1000 ohms, threshold 0.7 volts at 0.5  milliseconds once the lead was actively fixed.  A 10-volt pacing did not  stimulate the diaphragm.  With these satisfactory parameters, the lead  was secured to the subpectoralis fascia with a figure-of-eight silk  suture and the sewing sleeve was secured with silk suture as well.  Electrocautery was then utilized to make a subcutaneous pocket.  Kanamycin irrigation was utilized to irrigate the pocket.  Electrocautery was utilized to assure hemostasis.  At this point, the  new Guidant Vitality II single chamber  defibrillator, serial number  [id number], was connected to the new defibrillation lead and placed back in  the subcutaneous pocket.  Generator was secured with silk suture.  Additional kanamycin was utilized to irrigate the pocket and the  incision was closed with a layer of 2-0 Vicryl, followed by a layer of 3-  0 Vicryl.   Defibrillation was subsequently carried out after the patient was more  deeply sedated with fentanyl and Versed.  VF was then induced with a T-  wave shock and a 14-joule shock was subsequently delivered which  terminated VF and restored sinus rhythm.  With satisfactory safety  margin, no additional defibrillation threshold testing was carried out  and the procedure was then directed towards removal of the old ICD  device.   After additional lidocaine was infiltrated in the pocket, a 7 cm  incision was again carried out over the left upper quadrant region.  Electrocautery was  utilized to dissect down to the old ICD pocket site.  There were fairly dense fibrous adhesions, electrocautery was utilized  to overcome these and remove the old Guidant bifurcated bi-pole  defibrillator without difficulty.  The defibrillator and rate sense  leads were capped and at this point, the pocket was irrigated with  kanamycin and the incision closed with layer of 2-0 Vicryl, followed by  a layer of 3-0 Vicryl.  Benzoin painted on the skin, Steri-Strips were  applied and pressure dressing was placed and the patient was returned to  her room in satisfactory condition.   COMPLICATIONS:  There were no immediate procedure complications.   RESULTS:  This demonstrates successful implantation of a new  defibrillator, followed by removal of the old device without immediate  procedure complications.      Champ Mungo. [name], MD  Electronically Signed     GWT/MEDQ  D:  02/05/2007  T:  02/05/2007  Job:  [id number]   cc:   Delfino Lovett A. Rollene Fare, M.D.

## 2010-05-31 NOTE — Letter (Signed)
February 20, 2007    Quay Burow, M.D.  [facility] [address] [place], [place] [address]   RE:  BABBY, BOTTENFIELD  MRN:  [id number]  /  DOB:  02-27-1931   Dear Roderic Palau,   As we spoke earlier, Ms. Russell came in because of swelling in her left  arm and a feeling of engorgement on the left side, including her left  breast.  In January, she had explantation of her abdominal device  because of lead dysfunction, and a new device was implanted in her left  pectoral area.  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  There are a few veins  on her left chest.   She has no complaints of shortness of breath.  She is on Plavix, and she  is not quite sure why.  She also takes Norpace 150 q.12h. as well as  metoprolol 25 t.i.d.   PHYSICAL EXAMINATION:  Her blood pressure was not checked.  LUNGS:  Clear.  Heart sounds were regular.  EXTREMITIES:  As noted previously.   As imaged, I think she probably has a DVT there and if not, one  impending because of significant stenosis to outflow.  Typically, I  treat these people for about three months with  Coumadin, and the issue obviously is a Coumadin/Plavix dilemma.  I  appreciate your expertise and will defer to that regarding what to do  about her Plavix as you initiate the Coumadin.   If there is anything we can do, please do not hesitate to contact me.    Sincerely,      Deboraha Sprang, MD, [facility]  Electronically Signed    SCK/MedQ  DD: 02/20/2007  DT: 02/20/2007  Job #: [id number]

## 2010-05-31 NOTE — Assessment & Plan Note (Signed)
[facility] OFFICE NOTE   KILA, LANGNESS                     MRN:          [id number]  DATE:01/08/2007                            DOB:          04/13/1931    [name] returns today for followup.  She is a very pleasant woman who  is referred back by Dr. Rollene Fare.  She has a history of catecholamine-  induced ventricular tachycardia with syncope and is status post ICD  insertion, initially back in 1993, with a rate-sense lead placed in the  past.  Her most recent defibrillator was placed in 2003.  The patient  was hospitalized back in November with multiple episodes of nonsustained  polymorphic VT, and at that time, her medications were adjusted, and she  was discharged home.  She remains on Norpace.  She has been otherwise  stable but was found to have reach elective replacement indication and  is here now for additional evaluation and consideration for ICD  revision.   PHYSICAL EXAMINATION:  Notable for her being a pleasant woman in no  acute distress.  Blood pressure today was 148/80.  Pulse 58 and regular.  Respirations  were 18.  Weight was 193 pounds.  HEENT:  Normocephalic and atraumatic.  Pupils are equal and round.  Oropharynx is moist.  Sclerae are anicteric.  NECK:  No jugular venous distention.  There is no thyromegaly.  Trachea  is midline.  Carotids are 2+ and symmetric.  LUNGS:  Clear bilaterally to auscultation.  There are no wheezes, rales  or rhonchi.  CARDIOVASCULAR:  Regular rate and rhythm with a normal S1 and S2.  EXTREMITIES:  No edema.   Interrogation of her defibrillator demonstrates a Guidant Mini 4 with a  threshold of 2 volts at 0.8 ms.   Review of her chest x-ray demonstrates separate rate-sensing lead on the  RV septum with old Endotak lead in the RV apex.   IMPRESSION:  1. Catecholinergic ventricular tachycardia.  2. Status post implantable  cardioverter/defibrillator insertion, now      at elective replacement indication (ERI).  3. Coronary disease.   DISCUSSION:  Overall, the patient is stable, but her defibrillator has  reached ERI.  Because her system has been present now since 1993 and  because there is evidence, not only of rate-sensing lead problems but  previously problems with her shocking impedances, I have recommended  that the patient undergo new implantation and removal of her previously  implanted abdominal pocketed device.  As such, we will have her come in  several weeks to accomplish this.  If her subclavian vein remains  patent, then my initial plan would be to place her system in the left  pectoral region, realizing that if her subclavian vein is occluded, we  might ultimately require insertion through either the left jugular vein  with tunneling of the lead, or  insertion into the right subclavian vein.  The risks, benefits, goals,  and expectations of the procedure have been discussed with the patient,  and she wishes to proceed.     Champ Mungo. [name], MD  Electronically  Signed    GWT/MedQ  DD: 01/09/2007  DT: 01/09/2007  Job #: [id number]   cc:   Chipper Herb, M.D.  Richard A. Rollene Fare, M.D.

## 2010-05-31 NOTE — Discharge Summary (Signed)
[name], [name]              ACCOUNT NO.:  [id number]   MEDICAL RECORD NO.:  [id number]          PATIENT TYPE:  INP   LOCATION:  4705                         FACILITY:  [facility]   PHYSICIAN:  Champ Mungo. [name], MD    DATE OF BIRTH:  [birth year]   DATE OF ADMISSION:  02/05/2007  DATE OF DISCHARGE:  02/06/2007                               DISCHARGE SUMMARY   ALLERGIES:  This patient has allergies to Mullin and Jenks.   TIME SPENT ON DISCHARGE:  Greater than 40 minutes including examination.   FINAL DIAGNOSIS:  Cardioverter defibrillator implanted for catecholamine-  induced ventricular tachycardia in 1993, now with generator replacement  in 2003, is at end-of-life.   SECONDARY DIAGNOSIS:  Discharging day #1 status post explant of existing  left upper quadrant ICD generator with implant of a Guidant Vitality 2  VR ICD with implant of new defibrillator lead to the right subclavian  vein, Dr. Cristopher Peru.   SECONDARY DIAGNOSES:  1. Recent admission November 2008 with multiple episodes of      nonsustained polymorphic ventricular tachycardia with increase in      Norpace doses from one b.i.d. to two b.i.d.  2. History of coronary artery disease.  Ejection fraction at      catheterization in July 2003 estimated at 60%.   BRIEF HISTORY:  [name] is a 77 year old female who is referred by  Dr. Rollene Fare.  She has a history of catecholamine-induced ventricular  tachycardia with syncope and for this, a cardioverter defibrillator was  implanted in 1993.  This was implanted in the abdominal left upper  quadrant.  The patient had a defibrillator generator change in 2003.  In  November 2008, she had multiple episodes of nonsustained polymorphic VT.  Her Norpace was increased to two tablets in the morning and two tablets  in the evening.  She has been stable since then and has had no further  cardioverter defibrillator therapies.  The patient is noted to have  generator at  elective replacement indicator and is referred to Dr.  [name] for consideration of ICD revision.  The plan would be to implant  a new cardioverter-defibrillator lead since the one has rate sensing and  possibly shocking impedance problems.  Also, the left upper quadrant  device will be explanted and a new ICD placed into a pocket created in  the left upper pectoral region.  The risks and benefits of this  procedure have been described to the patient and she wishes to proceed.   HOSPITAL COURSE:  The patient presents electively February 05, 2007.  The  procedure as described above was carried forward without complication.  Both of the incisions in the left upper quadrant and in the left upper  chest are without hematoma.  The patient had been on Plavix which was  held prior to the procedure and which will be held after the procedure  until February 11, 2007.   The patient is discharging postprocedure day #1 on the original  medications with the addition of Keflex 500 mg q.i.d. for a 5-day  period.   Chest  x-ray has been taken postprocedurally and there is no pneumothorax  and leads are in appropriate position.  The device has been interrogated  and is working within normal limit parameters.   DISCHARGE MEDICATIONS:  The patient discharges on the following  medications:  1. Keflex 500 mg one tablet one half hour before breakfast, lunch,      dinner and bedtime for the next 5 days.  2. Plavix start at Monday, January 26.  3. Zestril 10 mg daily.  4. Norpace 100 mg two tablets in the morning and two tablets in the      evening.  5. Metoprolol 50 mg twice daily.  6. Lipitor 30 mg daily at bedtime.  7. Demadex 20 mg daily.  8. Paxil 20 mg daily.  9. Ativan 0.5 mg daily.  10.Enteric-coated aspirin 81 mg daily.  11.Citracal and vitamin D daily.  12.Vitamin D 5000 units weekly.  13.Centrum Silver.   LABORATORY STUDIES:  Lab studies this admission.  Serum electrolytes:  The sodium is  134, potassium 5.3, chloride 98, carbonate 21.  BUN is 22,  creatinine 1.34, glucose 93.  Complete blood count:  White cells 6.3,  hemoglobin 12.7, hematocrit 39.5 and platelets of 326.  Protime is 10.5,  INR 1.0.      Sueanne Margarita, PA      Champ Mungo. [name], MD  Electronically Signed    GM/MEDQ  D:  02/06/2007  T:  02/06/2007  Job:  [id number]   cc:   Chipper Herb, M.D.  Richard A. Rollene Fare, M.D.  Quay Burow, M.D.  Champ Mungo. [name], MD

## 2010-05-31 NOTE — Consult Note (Signed)
[name], [name]              ACCOUNT NO.:  [id number]   MEDICAL RECORD NO.:  [id number]          PATIENT TYPE:  INP   LOCATION:  2020                         FACILITY:  [facility]   PHYSICIAN:  Champ Mungo. [name], MD    DATE OF BIRTH:  [birth year]   DATE OF CONSULTATION:  11/29/2006  DATE OF DISCHARGE:                                 CONSULTATION   REASON FOR CONSULTATION:  [name] is referred today for evaluation by  Dr. Terance Ice for additional evaluation and treatment for  polymorphic ventricular tachycardia.   HISTORY OF PRESENT ILLNESS:  The patient is a 77 year old woman who has  a history of VT/VF arrest dating back to 77.  At that time, she was  thought to have an adrenergic mediated VT and underwent open thoracotomy  ICD implantation.  She has had generator changes in the past and a new  defibrillator lead placed in 2007.  The patient has not had any recent  ICD discharges but has noted episodes of near syncope.  She has had  three of these in the last month.  There has been nothing to exacerbate  these episodes that she can admit to.  She does, however, note that she  has had an upper respiratory tract illness.  She denies medical non-  compliance. The patient denies heart failure symptoms.  She does not  have much in the way of peripheral edema.   PAST MEDICAL HISTORY:  Her past medical history is also notable for  hypertension.  She has a history of very mild peripheral edema for which  she takes a diuretic intermittently.  She has a history of leg cramps.  She has a history of chronic diarrhea.  She has a history of  asymptomatic elevated liver function tests in the past.   FAMILY HISTORY:  Noncontributory.   SOCIAL HISTORY:  The patient is widowed. She denies tobacco or ethanol  abuse.   REVIEW OF SYSTEMS:  The review of systems was reviewed in detail, there  were no abnormalities on review of systems except as noted in the HPI,  with the exception that  she does complain of some fullness in the mid  epigastric region which is not related to exertion and not related to a  particular food intake.  She has noticed it more in the last month.   PHYSICAL EXAMINATION:  GENERAL:  She is a pleasant well appearing 77-  year-old woman in no acute distress.  VITAL SIGNS:  Blood pressure 139/74, pulse 65 and regular, respirations  were 20, temperature is 98.  HEENT:  Normocephalic and atraumatic.  Pupils equal and round.  The  oropharynx is moist.  Sclerae were anicteric.  NECK:  The neck revealed no jugular distention, no thyromegaly, the  trachea is midline, the carotids are 2+ and symmetric.  LUNGS:  Clear bilaterally to auscultation, no wheezes, rales or rhonchi  were present.  There is no increased work of breathing.  CARDIAC:  Regular rate and rhythm, normal S1 and S2, no murmurs, rubs,  or gallops appreciated except for a very soft S4.  ABDOMEN:  Soft, nontender, nondistended.  There is a well healed  incision in the left upper quadrant with defibrillator placed there.  There is no rebound or guarding present.  EXTREMITIES:  Demonstrated no cyanosis, clubbing or edema.  Pulses 2+  and symmetric.  NEUROLOGICAL:  Alert and oriented x3.  Cranial nerves intact.  Strength  5/5 and symmetric.   The EKG demonstrates sinus rhythm with normal axis and intervals.  Review of her telemetry strips demonstrates recurrent episodes of  nonsustained polymorphic VT originating from the same PVC which I think  is a Purkinje fiber firing.   IMPRESSION:  1. Idiopathic polymorphic VT.  2. Status post ICD insertion.  3. History of recent upper respiratory tract illness questionably      exacerbating number 1.   DISCUSSION:  [name] is stable but continues to have these  symptomatic episodes of nonsustained polymorphic VT.  She has not  received any ICD shocks.  She has been fairly well controlled in the  past on Norpace in conjunction with beta blockers  with a dose of 150  twice daily of her Norpace and 25 mg three times a day of her Lopressor.  I have recommended that we up titrate her medications to 50 twice daily  of her Lopressor and increase her Norpace from 150 to 200 mg twice daily  in an attempt to suppress these arrhythmias.  Her defibrillator has been  interrogated. The device is still MOL2.  I would anticipate that she may  require a new endocardial system be placed at the time of the next  generator change.  I would recommend, also, that we keep the patient in  the hospital for 48 hours and monitor her to make sure she is going to  be stable.      Champ Mungo. [name], MD  Electronically Signed     GWT/MEDQ  D:  11/29/2006  T:  11/29/2006  Job:  [id number]   cc:   Chipper Herb, M.D.

## 2010-06-03 NOTE — Cardiovascular Report (Signed)
[place]  Patient:    VALARI, WHITLER Visit Number: [id number] MRN: [id number]          Service Type: CAT Location: [id number] Attending Physician:  Berry, Jonathan Martinique Dictated by:   Lorretta Harp, M.D. Admit Date:  07/29/2001 Discharge Date: 07/30/2001   CC:         Cardiac Catheterization Lab  [facility], [address], [place] [address]  Cari Caraway, M.D.   Cardiac Catheterization  HISTORY:  The patient is a 77 year old female with a history of hypertension, hyperlipidemia, history of heart disease.  She had a myocardial infarction complicated by VF in the past and has had AICD placed.  She had stenting of her ramus branch, February 25, 1997.  She is complaining of new onset of chest pain.  A Cardiolite performed July 23, 2001, revealed inferoapical ischemia. She presents now for a diagnostic coronary arteriography and potential intervention.  DESCRIPTION OF PROCEDURE:  The patient was brought to the second floor [facility] in the postabsorptive state.  She was premedicated with p.o. Valium as well as IV Benadryl, ______, and Solu-Medrol.  Her right groin was prepped and shaved in the usual sterile fashion.  Xylocaine, 1%, was used for local anesthesia.  A #6 French sheath was inserted into the right femoral artery using the standard Seldinger technique.  The #6 French right and left Judkins diagnostic catheters along with a #6 French pigtail catheter were used for selective coronary angiography and left ventriculography, respectively.  Omnipaque dye was used for the entirety of the case.  Retrograde aortic, left ventricular, and  pullback pressures were recorded.  HEMODYNAMICS: 1. Aortic systolic pressure Q000111Q, diastolic pressure 95. 2. Left ventricular systolic pressure Q000111Q, end diastolic pressure 76.  SELECTIVE CORONARY ANGIOGRAPHY: 1. Left main:  Normal.  2. LAD:  The  LAD had a very proximal segmental 50% hypodense lesion beginning    at the ostium and extending just proximal to the first moderate sized    diagonal branch.  3. Ramus intermedius branch:  This is a large vessel which was previously    stented and widely patent.  4. Circumflex:  This is a nondominant vessel and was widely patent.  5. Right coronary artery:  This is a dominant vessel and had, at most, 30-40%    mid stenosis.  LEFT VENTRICULOGRAPHY:  RAO left ventriculogram was performed using 20 cc of Omnipaque dye at 10 cc per second.  The overall LVEF was estimated at greater than 60% without focal wall motion abnormalities.  IMPRESSION:  The patient has moderate proximal left anterior descending artery disease that is hypodense.  It is not in the distribution of the right coronary artery, though possibly because of this, a large vessel that wraps the apex could potentially cause inferoapical ischemia.  PLAN:  Perform IVUS-guided potential PCI.  DESCRIPTION OF PROCEDURE:  The existing #6 French sheath in the right femoral artery was exchanged over wire for a #7 [place] sheath.  The patient received a total of 4500 units of heparin intravenously with the ending ACT of 233.  She was on aspirin and received 300 mg of p.o. Plavix as well as Integrilin double bolus and infusion.  Omnipaque dye was used for the entirety of the case. Retrograde aortic pressures were monitored during the case.  Using a #7 [place] guide catheter along with an 0.014, 190 support guidewire and a #3.2 [place] Galaxy IVUS catheter,  intravascular ultrasound was performed.  The distal reference segment was approximately 3.5 to 3.8 mm.  The plaque was almost occlusive with a circumferential area of soft plaque.  Following this, the proximal LAD was predilated with a 2.5 x 15-mm Maverick at nominal pressures.  A 2.5 x 13-mm Cypher stent was then deployed at the ostium of the LAD extending to just proximal to  the diagonal branch takeoff incorporating the majority of the plaque affected area.  This was deployed at 15 atmospheres.  A 3.25 x 12-mm Quantum Maverick was then used to post dilate within the stent up to 15 atmospheres resulting in a post dilatation luminal diameter of 3.3 mm.  Intracoronary nitroglycerin, 200 mcg, was administered. The patient did complain of some chest pain but had no hemodynamic or electrocardiographic sequelae.  OVERALL IMPRESSION:  Successful IVUS-guided proximal left anterior descending artery percutaneous coronary intervention and stenting using adjunctive glycoprotein IIb/IIIa inhibition.  PLAN:  Discontinue heparin.  The sheaths will be removed once the ACT falls below 150.  Integrilin will be continued for 18 hours.  The patient will be treated with aspirin and Plavix.  She will be seen later on today by Dr. Jolyn Nap for evaluation of end-of-life AICD.  This will most likely have to be postponed because of concomitant Plavix use for the next 3 months.  The patient left the lab in stable condition.  Dr. Cari Caraway was notified of these results. Dictated by:   Lorretta Harp, M.D. Attending Physician:  Berry, Jonathan Martinique DD:  07/29/01 TD:  07/30/01 Job: [id number]

## 2010-06-03 NOTE — Discharge Summary (Signed)
[place]  Patient:    [name], [name] Visit Number: [id number] MRN: [id number]          Service Type: CAT Location: [id number] Attending Physician:  Berry, Jonathan Martinique Dictated by:   Erlene Quan, P.A.C. Admit Date:  07/29/2001 Discharge Date: 07/30/2001                             Discharge Summary  DISCHARGE DIAGNOSES: 1. Coronary disease, chest pain and abnormal Cardiolite study followed by    catheterization and subsequent IVUS guided percutaneous transluminal    coronary angioplasty and stenting to the left anterior descending artery    this admission. 2. History of ramus intervention in February 1999. 3. Remote myocardial infarction in 1993, secondary to vasospasm. 4. ICD at [facility] in 1993. 5. Gastroesophageal reflux disease. 6. Treated hyperlipidemia. 7. Treated hypertension.  HOSPITAL COURSE:  The patient is a 77 year old female followed by Dr. Gwenlyn Found with a history of coronary disease.  She had a ramus intermedius intervention in 1999 for single vessel disease.  She has had an ICD placed at [facility] for a polymorphic ventricular tachycardia.  This has reached end-of-life.  Recently, she has had exertional chest discomfort suspicious for angina, and had a Cardiolite study on 07/23/01, that was abnormal with moderate inferior and apical ischemia.  She was set up for diagnostic catheterization, further evaluation.  Catheterization was done on 07/29/01, which revealed normal right coronary artery, normal left main, normal circumflex, patent ramus intermedius site, and a 40 to 50% hypodense left anterior descending artery narrowing proximally.  The patient underwent IVUS guided stenting with a Cypher stent. The patient was put on Integrilin for 18 hours.  Postoperatively, she did well.  She was seen by Dr. Caryl Comes, and will be set up for elective ICD generator change in the first week of August.  They will contact her regarding this.   The patient is discharged on 07/30/01.  DISCHARGE MEDICATIONS: 1. Aspirin 81 mg p.o. q.d. 2. Plavix 75 mg p.o. q.d. 3. Nexium 40 mg q.d. 4. Toprol XL 25 mg q.d. 5. Lipitor 10 mg q.d. 6. Paxil 20 mg q.d. 7. Zestril 10 mg q.d. 8. Nitroglycerin sublingual p.r.n.  LABORATORY DATA:  Sodium 139, potassium 4.9, BUN 12, creatinine 0.7, INR 0.9, white count 5.4, hemoglobin 13.9, hematocrit 41.9, platelets 256.  Recent TSH 2.54.  EKG showed sinus rhythm, sinus bradycardia at 55, nonspecific ST changes.  DISPOSITION:  The patient is discharged in stable condition.  She will be contacted by Dr. Aquilla Hacker office regarding ICD change.  FOLLOWUP:  Dr. Gwenlyn Found on August 30, 2001. Dictated by:   Erlene Quan, P.A.C. Attending Physician:  Berry, Jonathan Martinique DD:  07/30/01 TD:  08/01/01 Job: [id number]

## 2010-06-03 NOTE — Discharge Summary (Signed)
[name], SMISEK                        ACCOUNT NO.:  [id number]   MEDICAL RECORD NO.:  [id number]                   PATIENT TYPE:  INP   LOCATION:  2901                                 FACILITY:  [facility]   PHYSICIAN:  Richard A. Rollene Fare, M.D.          DATE OF BIRTH:  04-25-1931   DATE OF ADMISSION:  12/29/2002  DATE OF DISCHARGE:  01/03/2003                                 DISCHARGE SUMMARY   DISCHARGE DIAGNOSES:  1. Recurrent symptomatic nonsustained ventricular tachycardia.  2. History of an implantable cardioverter-defibrillator in 1993 with     generator change-out in August, 2003 with a Guidant device.  3. Coronary disease:  Left anterior descending artery and ramus stenting,     July, 2003, with patent sites at catheterization, October, 2004, and     normal left ventricular function.  4. History of irritable bowel syndrome with chronic diarrhea.   HOSPITAL COURSE:  Patient is a 77 year old female who has a history of  ventricular fibrillation and arrest in 1993.  She was thought to have an MI  secondary to spasm at that time.  A catheterization showed normal coronaries  and good LV function.  She underwent an ICD implantation.  She underwent a  generator change in 1997 and again in August, 2003 with a Guidant device.  She had a syncopal spell in October, 2004.  At that time, she had a V fib  arrest which was successfully terminated with an ICD shock.  She was  admitted to the hospital at that time and had a catheterization, which  showed patent LAD and ramus intermedius stents.  She had normal LV function.  Her beta blocker was increased at that time.  She had some fatigue and had  to cut back on her beta blocker dose.  On the day of admission, she had two  near-syncopal spells.  She was seen at the office by Dr. Rollene Fare and had  nonsustained ventricular fibrillation.  She was admitted to telemetry.  She  does have a history of irritable bowel syndrome and has been  having  diarrhea, but her electrolytes did not show any significant abnormalities.  Patient was seen in consult by Dr. Cristopher Peru.  It was suspected that she  had primary ventricular fibrillation secondary to Purkinje fiber firing.  It  was elected to add Norpace.  Patient was placed on 200 mg of Norpace b.i.d.  and monitored.  Dr. Caryl Comes does note that we had considered Quinidine but  doubted that she would be able to tolerate this with her history of diarrhea  and irritable bowel syndrome.  We monitored her until January 03, 2003.  We  feel she can be discharged and will follow up with Dr. Rollene Fare initially  and then Dr. Gwenlyn Found, her primary cardiologist.  The QTC is 461.  Dr. Caryl Comes  has suggested Norpace 200 mg twice daily for four weeks and then cutting her  back to  150 mg twice a day.   MEDICATIONS:  1. Zestril 10 mg daily.  2. Plavix 75 mg daily.  3. Toprol XL 25 mg daily.  4. Lipitor 10 mg daily.  5. Paxil 20 mg daily.  6. Aspirin 80 mg daily.  7. Torsemide 20 mg daily.  8. Norpace 200 mg twice daily for four weeks, then consider cutting her back     to 150 mg twice daily.   LABS:  Sodium 141, potassium 4.1, BUN 14, creatinine 0.9.  White count 6.4,  hemoglobin 14.3, hematocrit 41.8, platelets 228, INR 0.9, TSH 3.11.  Urinalysis unremarkable.   Chest x-ray shows no acute abnormality.   DISPOSITION:  Patient is discharged in stable condition and will follow up  with Dr. Rollene Fare initially and then see Dr. Alvester Chou, her primary  cardiologist.      Erlene Quan, P.A.                      Richard A. Rollene Fare, M.D.    Meryl Dare  D:  01/03/2003  T:  01/04/2003  Job:  [id number]   cc:   Champ Mungo. [name], M.D.   Vicie Mutters, MD  [phone]  [place] [address]  Fax: [phone]

## 2010-06-03 NOTE — Discharge Summary (Signed)
[name]. [name] [place]  Patient:    [name], [name]                     MRN: [id number] Adm. Date:  [id number] Disc. Date: [id number] Attending:  Berry, Jonathan Martinique Dictator:   Otilio Carpen. Dorene Ar, F.N.P.C. CC:         Freeman Caldron. Pauline Aus, M.D.  Nikki Dom, M.D., [facility]   Discharge Summary  DISCHARGE DIAGNOSES: 1. Chest pain, nonspecific. 2. Polymorphic ventricular tachycardia with automatic implanted cardioverter    defibrillator.    a. Patient admitted for what appeared to be a fracture of the automatic       implanted cardioverter defibrillator lead on x-ray. 3. Coronary artery disease.    a. History of stent in 1999.  DISCHARGE CONDITION:  Stable.  DISCHARGE MEDICATIONS:  1. Zestril 10 mg daily.  2. Norvasc 5 mg daily.  3. Ativan 0.5 mg daily.  4. Paxil 10 mg daily.  5. Lipitor 10 mg one every evening.  6. Aspirin 81 mg daily.  7. Vitamin E as before.  8. Centrum Silver as before.  9. Citrucel as before. 10. Claritin as before. 11. Zantac as before.  DISCHARGE INSTRUCTIONS: 1. Activity as tolerated. 2. Low-fat, low-salt diet. 3. Follow up with Lorretta Harp, M.D., on June 23, 2000, at 12:10 p.m. 4. Follow up with Nikki Dom, M.D., F.A.C.C., in six months.  Call    his office for an appointment date and time.  HISTORY OF PRESENT ILLNESS:  This 77 year old white female reported to her primary care physician, Adelina Mings, M.D., at [facility] a week or more ago with complaint of hurting under her rib cage.  No fever, but had discomfort on both sides of her lower ribs with sneezing or cough and some with moving her torso.  It resolved and she saw Adelina Mings, M.D., the week of admission.  A chest x-ray was done and the patient received a call from Dr. Addison Lank on April 20, 2000, stating that one of her leads in the AICD was broken and she should not stay at home, but go to the hospital.  The  patient presented to the [name]. She has a history of polymorphic ventricular tachycardia with an implantable defibrillator by Nikki Dom, M.D., F.A.C.C., in place.  PAST MEDICAL HISTORY:  In 1993, she fainted at work and was admitted to [facility]. [facility] with multiple episodes of recurrent ventricular tachycardia with cardioversion.  She received ICD at [facility].  In 1997, the generator and one lead were replaced.  She also required a stent to the ramus intermedius artery in 1999.  Other history includes hemorrhoidectomy.  ALLERGIES TO MEDICATIONS:  No known allergies, but intolerance to BETAPACE.  OUTPATIENT MEDICATIONS:  1. Zestril 10 mg daily.  2. Norvasc 5 mg daily.  3. Ativan 0.5 mg daily.  4. Paxil 10 mg daily.  5. Lipitor 10 mg daily.  6. Aspirin 81 mg daily.  7. Vitamin E.  8. Centrum Silver  9. Citrucel p.r.n. 10. Claritin p.r.n. 11. Zantac p.r.n.  FAMILY HISTORY:  See H&P.  SOCIAL HISTORY:  See H&P.  REVIEW OF SYSTEMS:  See H&P.  PHYSICAL EXAMINATION AT DISCHARGE:  Blood pressure 104/62, pulse 52, respirations 20, temperature 97.1 degrees, O2 saturation on room air 96%.  HEART:  S1 and S2.  Regular rate and rhythm.  LUNGS:  Clear to  auscultation bilaterally.  EXTREMITIES:  Without edema.  LABORATORY VALUES:  Hemoglobin 13.2, hematocrit 38.5, WBC 6.1, MCV 91.2, platelets 275.  Chemistries:  Sodium 137, potassium 3.9, chloride 102, CO2 29, glucose 114, BUN 16, creatinine 1.0, calcium 9.1, total protein 6.9, albumin 3.6, AST 20, ALT 20, ALP 72, total bilirubin 0.7.  Cardiac enzymes: CK 77, MB 1.4, troponin less than 0.01.  Chest x-ray:  Mild cardiomegaly, no active cardiopulmonary abnormality, suspect broken AICD lead.  EKG:  Sinus rhythm, left anterior fascicular block, left ventricular hypertrophy, nonspecific ST abnormality.  HOSPITAL COURSE:  Ms. Hamre was admitted by Freeman Caldron.  Pauline Aus, M.D., on April 20, 2000, for fracture of AICD lead per her chest x-ray.  She was monitored as it was concerned that her AICD would not fire if needed.  She was monitored over the weekend.  On April 23, 2000, Nikki Dom, M.D., F.A.C.C., consulted.  The ACID was not fractured on the chest x-ray views.  It will appear as such, but it is in fact intact.  Device interrogated by Nikki Dom, M.D., F.A.C.C.  She will follow up as an outpatient with him and her cardiologist, Lorretta Harp, M.D. DD:  05/13/00 TD:  05/14/00 Job: [id number]

## 2010-06-03 NOTE — Assessment & Plan Note (Signed)
[place] OFFICE NOTE   WILMER, FLANNIGAN                     MRN:          [id number]  DATE:10/31/2005                            DOB:          May 04, 1931    Pamala Hurry underwent colonoscopy on September 26, 2005, and had colon biopsies  which showed lymphocytic colitis.  She was placed on Entocort at 9 mg a day  and has become constipated to the point that she has had to take laxative  therapy.  She otherwise is doing well.   Exam today shows her to be a healthy appearing white female in no acute  distress.  Her weight is 198 pounds which is her normal weight and blood pressure is  112/80.  Pulse was 68 and regular.  Abdominal exam was entirely unremarkable.  There was no organomegaly,  masses, or distention.  She did have a palpable defibrillator in the left  upper quadrant area.  Inspection of the rectum was unremarkable.  Rectal exam showed a very  stenotic rectum which was dilated with my digit.  There was no evidence of  impaction and there was soft stool present that was guaiac negative.   ASSESSMENT:  Mrs. Smyly surprisingly had rather severe chronic diarrhea  which has responded well to a month only of Entocort therapy.  This is a  somewhat atypical response to Entocort, and patients need to usually be on  this for several months to a year for their lymphocytic colitis.  In any  case, she currently is having constipation problems.   RECOMMENDATIONS:  1. Discontinue Entocort.  2. MiraLax 8 ounces 1 to 2 times a day as needed to obtain bowel normalcy.  3. Continue other multiple medications as per Dr. Laurance Flatten.  4. Gastrointestinal followup on a p.r.n. basis as needed.       Loralee Pacas. Sharlett Iles, MD, [facility], [place]      DRP/MedQ  DD:  10/31/2005  DT:  11/01/2005  Job #:  [id number]   cc:   Chipper Herb, M.D.

## 2010-06-03 NOTE — Op Note (Signed)
[name], [name]              ACCOUNT NO.:  [id number]   MEDICAL RECORD NO.:  [id number]          PATIENT TYPE:  OIB   LOCATION:  2899                         FACILITY:  [place]   PHYSICIAN:  Geroge Baseman. Mortenson, M.D.DATE OF BIRTH:  10-06-1931   DATE OF PROCEDURE:  11/27/2003  DATE OF DISCHARGE:                                 OPERATIVE REPORT   PREOPERATIVE DIAGNOSIS:  Internal derangement, left knee.   POSTOPERATIVE DIAGNOSES:  1.  Tear of lateral meniscus, left knee.  2.  Large chondral damage, weightbearing area of medial femoral condyle of      left knee.   OPERATION:  1.  Arthroscopy.  2.  Debridement of lateral meniscus, left knee.  3.  Chondroplasty, medial femoral condyle, left knee.   SURGEON:  Geroge Baseman. Alphonzo Cruise, M.D.   ANESTHESIA:  General.   PATHOLOGY:  With the arthroscope in the knee, a very careful examination of  the knee was undertaken.  The patellofemoral joint appeared fairly normal.  The arthroscope was then placed in the lateral compartment and the articular  cartilage of the lateral femoral condyle and lateral tibial plateau appeared  fairly normal, but there was fraying of the lateral meniscus from the mid-  third to the posterior third.  In the intercondylar notch, the anterior  cruciate ligament seemed very rudimentary and not robust.  In the medial  compartment, the articular cartilage over the medial tibial plateau and the  entire circumference of the medial meniscus was normal, but there was marked  fraying and tearing of articular cartilage over the weightbearing area of  the medial femoral condyle.  The area of the crotch was flaked off the most.   PROCEDURE:  Patient placed on the operating table in the supine position  with the pneumatic tourniquet about the left upper thigh.  The left leg was  placed in a leg holder.  The entire left lower extremity was prepped with  Duraprep and draped out in the usual manner.  The leg was wrapped out  with  an Esmarch and the tourniquet elevated.  An infusion cannula was placed in  the superior medial pouch and the knee distended with saline.  Anteromedial  and anterolateral portals were made.  The arthroscope was introduced.  The  findings were described as above.  Attention was first turned to the lateral  meniscus.  There was marked fraying and tearing of the mid-third and  posterior third of the lateral meniscus for about the leading 30-40%.  This  was debrided with a series of baskets through the medial portal, followed by  the intra-articular shaver.  The remaining rim was then smoothed and  balanced in a nice transition to the anterior third of the lateral meniscus.  Excellent decompression was achieved.  Into the intercondylar notch area,  there was some fibrous tissue in the notch area, and this was debrided with  a chondroplastic shaver.  In the medial compartment there was marked fraying  and tearing of the articular cartilage of the weightbearing area of the  medial femoral condyle.  The torn and flaked cartilage could  be broken off  with a probe.  A smooth full-radius resector was inserted and a  chondroplasty was done.  All the loose cartilage was then removed.  No other  significant problems were seen in the knee.  The knee was then filled with  Marcaine and a large bulky pressure dressing applied.  The patient then  returned to the recovery room in excellent condition.  Technically this  procedure went extremely well.   FOLLOW-UP:  1.  Admitted overnight, cardiac monitored bed, for observation.  2.  Discharge in the morning.  3.  Return to office on Wednesday.       RAM/MEDQ  D:  11/27/2003  T:  11/28/2003  Job:  [id number]

## 2010-06-03 NOTE — Op Note (Signed)
[name], [name]                        ACCOUNT NO.:  [id number]   MEDICAL RECORD NO.:  [id number]                   PATIENT TYPE:  OIB   LOCATION:  2899                                 FACILITY:  [place]   PHYSICIAN:  Champ Mungo. [name], M.D. [facility]           DATE OF BIRTH:  Oct 15, 1931   DATE OF PROCEDURE:  08/21/2001  DATE OF DISCHARGE:  08/21/2001                                 OPERATIVE REPORT   PROCEDURE:  Explantation of an old Guidant defibrillator (model 1746) and  insertion of a new Guidant Mini-IV Plus single-chamber defibrillator, with  defibrillation lead and device testing.   INTRODUCTION:  The patient is a very pleasant middle-aged woman with a  history of ischemic heart disease and a history of ventricular tachycardia,  who underwent ICD implantation in 1993.  This was after she had demonstrable  VT with syncope.  Her most recent defibrillator was placed in 1997.  She now  returns as her old Guidant model [id number] defibrillator is at ERI.   DESCRIPTION OF PROCEDURE:  After informed consent was obtained, the patient  was taken to the diagnostic EP lab in the fasted state.  After the usual  preparation and draping, intravenous fentanyl and midazolam were given for  sedation.  A total of 30 cc of lidocaine was infiltrated into the left upper  quadrant over the old ICD scar.  A 12 cm incision was carried out over this  region and electrocautery utilized to dissect down to the defibrillator  pocket.  It was subsequently entered and the defibrillator was removed under  gentle traction.  The leads were tested and R-waves were found to be 20  millivolts with a pacing threshold of 2.5 volts at 0.5 msec.  The pacing  impedances were stable.  At this point the Guidant Mini-IV Plus single-  chamber defibrillator with a bifurcated bipolar header was connected to the  old rate-sense lead and the defibrillator leads were replaced in the pocket.  The patient was deeply sedated and a  single 1-volt shock was delivered,  demonstrating satisfactorily-functioning defibrillation leads.  At this  point VF was induced with a T-wave shock and a 21-joule shock terminated  ventricular fibrillation, restoring sinus rhythm.  No additional DFT testing  was carried out, and the patient's incision was closed with a layer of 2-0  Vicryl, two layers of 3-0 Vicryl, and a layer of 4-0 Vicryl.  Benzoin was  painted on the skin and Steri-Strips were applied and a pressure dressing  placed and the patient returned to her room in satisfactory condition.   COMPLICATIONS:  There were no immediate procedural complications.   RESULTS:  This demonstrates successful implantation of a Guidant single-  chamber defibrillator (Ventak Mini-IV Plus AICD, serial number [id number])  utilizing the previously-implanted defibrillating rate-sensing leads.  There  were no immediate procedural complications.  Champ Mungo. [name], M.D. [facility]   GWT/MEDQ  D:  08/21/2001  T:  08/25/2001  Job:  [id number]   cc:   Lorretta Harp, M.D.   Deboraha Sprang, M.D. [facility]   Kathlynn Grate, [facility]

## 2010-06-03 NOTE — Consult Note (Signed)
[name], [name]                        ACCOUNT NO.:  [id number]   MEDICAL RECORD NO.:  [id number]                   PATIENT TYPE:  INP   LOCATION:  2040                                 FACILITY:  [place]   PHYSICIAN:  Champ Mungo. [name], M.D.               DATE OF BIRTH:  January 15, 1932   DATE OF CONSULTATION:  10/31/2002  DATE OF DISCHARGE:  10/31/2002                                   CONSULTATION   REASON FOR CONSULTATION:  Evaluation of syncope in the setting of VF arrest.   REFERRING PHYSICIAN:  Quay Burow, M.D.   HISTORY OF PRESENT ILLNESS:  The patient is a very pleasant 77 year old  woman with an ischemic cardiomyopathy and history of polymorphic VT status  post ICD insertion initially in 1993.  She had ICD generator change in 1997  and again in 2003.  She has a left upper quadrant abdominal system.  The  patient has known coronary artery disease and is status post angioplasty.  She was in her usual state of health until the day of admission when she was  singing in church and she suddenly passed out.  After approximately one  minute, she returned to consciousness.  She had no warning that she would  pass out and denied other symptoms prior to her episode.  On awakening, she  was somewhat nauseated, so she was taken to the emergency department.   PAST MEDICAL HISTORY:  Notable for a history of chronic gastroesophageal  reflux disease, history of depression, history of hyperlipidemia, history of  diarrhea.  She had a history of UTI's in the past.  I do not have records  regarding her LV function at present.   FAMILY HISTORY:  Noncontributory.   SOCIAL HISTORY:  The patient denies tobacco or ethanol use.   REVIEW OF SYSTEMS:  Notable for the patient recently having some diarrheal  illness and fatigue and weakness.  She denies chest pain or shortness of  breath.  She denies cough or hemoptysis.  She denies fevers or chills.  She  denies arthritic complaints.  She  denies any skin problems.  She denies  nausea, vomiting, or constipation.   PHYSICAL EXAMINATION:  GENERAL: She is a pleasant, well-appearing, 12-year-  old woman in no acute distress.  VITAL SIGNS:  Blood pressure 150/80, pulse 60 and regular, respirations 18.  Weight was 204 pounds.  HEENT:  Normocephalic and atraumatic.  Pupils equal, round, and reactive to  light.  Oropharynx was moist.  Sclerae anicteric.  NECK:  No jugular venous distention.  No thyromegaly.  Trachea was midline.  LUNGS:  Clear bilaterally to auscultation.  HEART:  Regular rate and rhythm with normal S1 and S2.  ABDOMEN:  Soft, nontender, and nondistended.  There was a very well-healed  left upper quadrant scar where her defibrillator site is located.  EXTREMITIES:  No cyanosis, clubbing, or edema.  NEUROLOGY:  Alert and oriented  x3 with Cranial nerves II-XII grossly intact.  Strength was 5/5 and symmetric.   EKG demonstrates normal sinus rhythm with left anterior fascicular block and  nonspecific ST T wave abnormalities.   Interrogation of her defibrillator demonstrates successfully treated episode  of VF with 31 joule shock.  Her R waves are satisfactory.  Her pacing  threshold is somewhat increased, but stably so at 2.6 volts at 0.5  milliseconds with a pacing impedence of 814 ohms also stable.   IMPRESSION:  1. Ischemic cardiomyopathy.  2. History of VTVF, status post ICD insertion 11 years ago.  3. Status post VF with successful ICD discharge, restoring sinus rhythm.   DISCUSSION:  The patient has done well for 11 years without an ICD shock and  because she is otherwise feeling quite well without symptoms, I have  recommended that she not undergo treatment with antiarrhythmic drug therapy.  However, if she has developed recurrent ICD discharges or tachypalpitations,  then antiarrhythmic therapy would certainly be warranted and I would be  happy to see her back to discuss the options for treatment with  her as  needed.                                               Champ Mungo. [name], M.D.    GWT/MEDQ  D:  10/31/2002  T:  10/31/2002  Job:  [id number]   cc:   Vicie Mutters, MD  [phone]  [place] [address]  Fax: [phone]   Quay Burow, M.D.  [phone]  [place]  [place] [address]  Fax: [place], R.N. [facility]

## 2010-06-03 NOTE — Consult Note (Signed)
[place]  Patient:    [name], [name] Visit Number: [id number] MRN: [id number]          Service Type: CAT Location: [id number] Attending Physician:  Berry, Jonathan Martinique Dictated by:   Nikki Dom, M.D., [facility] Admit Date:  07/29/2001 Discharge Date: 07/30/2001   CC:         Lorretta Harp, M.D.  Cari Caraway, M.D.  Electrophysiology Laboratory   Consultation Report  REASON FOR CONSULTATION:  Thank you very much for asking me to see [place] in electrophysiological consultation regarding a defibrillator implanted for polymorphic ventricular tachycardia, now at elective replacement interval.  HISTORY OF PRESENT ILLNESS:  [name] is a 76 year old woman who in 1993 had an acute myocardial infarction associated with normal coronary arteries.  Wall motion abnormality was attributed to spasm.  Around that time she was having syncope and polymorphic ventricular tachycardia and was referred to Dr. Bethann Humble down at [facility], who undertook a signal average ECG that was abnormal. Given the constellation mentioned above, she received an implantable defibrillator, receiving a Guidant 1600 unit associated with an Endotak 64 series lead.  Following that she was having a significant amount of ambient ventricular tachycardia and was treated with Quinaglute, terminated ultimately because of diarrhea, and then sotalol, which was complicated by torsade de points, and this was then discontinued.  She has been on no antiarrhythmic drugs since.  Shortly before her 1600 unit reached end of life, she had a defibrillator discharge.  (1600 units did not have electrograms).  She underwent defibrillator generator replacement, which was notable for inadequate sensing on her defibrillator lead either from lead problems or from exit block.  She received a new CPI0010 100 cm long rate-sensed lead that was implanted and then tunneled down  to her abdominal pocket and then became part of the system with a Guidant 1746 defibrillator.  She has had no intercurrent therapy.  She has had problems with chest pain.  She has had a GI evaluation that demonstrated severe erosive esophagitis.  She also had a Cardiolite that demonstrated inferoapical ischemia and underwent catheterization today demonstrating a 50% ? hazy lesion in the LAD and received stenting for this.  She has had no problems with palpitations or syncope.  Her exercise tolerance has been quite good.  PAST MEDICAL HISTORY:  In addition to the GERD, notable for hypertension, hyperlipidemia.  MEDICATIONS:  Nexium 40 mg, Zestril 10 mg, Toprol 25 mg, Ativan, Lipitor 10 mg, Paxil 20 mg, aspirin.  ALLERGIES:  IVP DYE.  FAMILY HISTORY:  Noncontributory.  SOCIAL HISTORY:  She is married, and she has four children.  PHYSICAL EXAMINATION:  VITAL SIGNS:  Her blood pressure is 134/57, her pulse is 74.  GENERAL:  On physical examination this afternoon she is in no acute distress.  HEENT:  No scleral icterus, no xanthoma.  NECK:  The neck veins were flat.  The carotids were brisk and full bilaterally without bruits, and there was no lymphadenopathy.  CHEST:  Her lungs were listened to laterally because she is still in bed from her procedure.  CARDIAC:  Her heart sounds were regular without murmurs or gallops.  ABDOMEN:  Soft with active bowel sounds, without midline pulsation or hepatomegaly.  Femoral pulses were not examined for the reasons above.  EXTREMITIES:  Distal pulses were intact.  There was no clubbing, cyanosis, or edema.  NEUROLOGIC:  Grossly normal.  LABORATORY DATA:  Laboratories here are unrevealing.  Electrocardiogram demonstrates sinus rhythm at 55 with intervals of 0.22/0.10/.042.  Interrogation of her [id number] defibrillator demonstrates a battery voltage of 2.71 with a charge time of 19.9, pacing parameters are stable with  an impedance of 785, an R-wave of 13.1, and thresholds of 2 volts at 0.6 msec. She has had multiple episodes of nonsustained VT but no intercurrent therapy.  IMPRESSION: 1. History of syncope with polymorphic ventricular tachycardia. 2. Acute myocardial infarction with normal coronary arteries but a wall    motion abnormality attributed to spasm in 1993. 3. Abnormal signal average electrocardiogram in conjunction with #1 and #2,    resulting in implantation of a defibrillator. 4. Coronary artery disease.    a. See above.    b. RI percutaneous coronary intervention in February 1999.    c. Left anterior descending coronary artery percutaneous coronary       intervention today.    d. Normal left ventricular function. 5. Nonsustained ventricular tachycardia post implantable cardioverter-    defibrillator implantation treated with quinidine and sotalol, the latter    stopped because of torsade de pointes. 6. Implantable cardioverter-defibrillator system including:    a. (1) a 1746  defibrillator generator; (2) a 64 series lead; (3) a       CPI0010 rate-sensed lead implanted in 1997, with change-out of her       initially-implanted 1600 lead.    b. Prior shock with the 1600 system.  RECOMMENDATIONS:  Ms. Ehresman has reached elective replacement interval of her defibrillator.  It was implanted for syncope with polymorphic ventricular tachycardia with one therapy prior to the change-out.  Notwithstanding the resolution of her left ventricular dysfunction, it is appropriate to proceed with defibrillator generator replacement.  Given the acute intervention, we would like to defer this for about three weeks but not much longer, given the fact that she is now almost six weeks past reaching ERI.  I have discussed this with her and her family.  We have discussed the  potential issues concerning lead fractures and infection at the time of defibrillator generator replacement.  I have also  reviewed with her that Champ Mungo. [name], M.D., will be the one doing the procedure, as he has greater experience with abdominal generator replacements.  She is agreeable to this.  Thank you for this consultation. Dictated by:   Nikki Dom, M.D., [facility] Attending Physician:  Berry, Jonathan Martinique DD:  07/29/01 TD:  08/01/01 Job: [id number]

## 2010-06-03 NOTE — Discharge Summary (Signed)
NAME:  [name], [name]                        ACCOUNT NO.:  [id number]   MEDICAL RECORD NO.:  [id number]                   PATIENT TYPE:  INP   LOCATION:  2040                                 FACILITY:  [facility]   PHYSICIAN:  Mary B. Easley, P.A.-C.             DATE OF BIRTH:  01-08-1932   DATE OF ADMISSION:  10/29/2002  DATE OF DISCHARGE:  10/31/2002                                 DISCHARGE SUMMARY   ADMISSION DIAGNOSES:  1. Syncope.  2. History of implantable cardioverter defibrillator implantation 1993     secondary to syncope with polymorphic VTACH.  This was performed at [facility].  3. Status post replaced implantable cardioverter defibrillator in 1997 and     again in 2003.  4. Coronary artery disease.  History of left anterior descending stenosis     with Cypher stent.  She had a previous stent to the ramus placed in 1999     which was taken at the last cath.  5. Diarrhea.  6. Urinary tract infection.  7. Chronic gastroesophageal reflux disease.  8. Hypertriglyceridemia.  9. Depression.   DISCHARGE DIAGNOSES:  1. Syncope.  2. History of implantable cardioverter defibrillator implantation 1993     secondary to syncope with polymorphic VTACH.  This was performed at [facility].  3. Status post replaced implantable cardioverter defibrillator in 1997 and     again in 2003.  4. Coronary artery disease.  History of left anterior descending stenosis     with Cypher stent.  She had a previous stent to the ramus placed in 1999     which was taken at the last cath.  5. Diarrhea.  6. Urinary tract infection.  7. Chronic gastroesophageal reflux disease.  8. Hypertriglyceridemia.  9. Depression.  10.      Status post implantable cardioverter defibrillator interrogation     showing recurrent ventricular fibrillation with shock placed.  11.      Status post cardiac catheterization by Dr. Quay Burow on     October 30, 2002.  It revealed previously placed stents to  be patent.     There is noncritical coronary artery disease.  Normal left ventricular     function.  12.      Status post electrophysiology consultation on October 31, 2002, by     Dr. Cristopher Peru.  Plan to continue present medical therapy with no     additional of antiarrhythmics necessary at this point.  See below for     details.   HISTORY OF PRESENT ILLNESS:  [name] is a 77 year old white female with  history of having ICD implanted in 1993 for syncope with polymorphic  ventricular tachycardia at [facility].  This was replaced in 1997 and again  in 2003.  Prior to her ICD replacement in 2003, she had an LAD stenosis  which was on IVUS and had  Cypher stenting placed.  She had a previous stent  to the ramus which had been placed back in 1999 which was patent on her last  cath.   On the prior Tuesday, she was singing with her family when she suddenly  collapsed without any warning.  She was out for approximately one minute.  There was no loss of bowel or bladder function and no awareness of any  ______________ on discharging and firing.  She denied any chest pain or  shortness of breath.   When EMS arrived, she felt okay but then became nauseated, so they brought  her to the ER.  Labs were stable.  She subsequently came to the office for  evaluation.  Her ICD was interrogated and showed that she did have  ventricular fibrillation and had been shocked with 31 joules into sinus  rhythm.   At that point, she was stable.  Her blood pressure was slightly elevated at  166/90, heart rate was 65.  No other significant abnormalities on physical  exam.  At that point, she was seen and evaluated by Dr. Quay Burow.  It  was felt that we needed to plan for admission to [facility] and  plan for repeat cardiac catheterization to rule out ischemic etiology for  her recurrent ventricular fibrillation and syncope.  We had also obtained EP  evaluation.  We placed on, at the time of  admission, to telemetry, started  IV heparin, check cardiac enzymes and plan for cardiac catheterization.  Risks and benefits of the procedure were discussed and she was willing to  proceed.   HOSPITAL COURSE:  On October 30, 2002, she underwent cardiac catheterization  by Dr. Quay Burow.  Her previously placed stents were found to be  patent.  She was found to have noncritical CAD.  There was normal LV  function.  We plan to continue current medications for her known past  coronary disease and plan for EP evaluation.   On October 31, 2002, she was seen in EP consultation by Dr. Cristopher Peru.  At that point, interrogation of the defibrillator demonstrated successfully  treated episode of V-fib with 31 joule shocks.  R waves were satisfactory.  Her pacing threshold was somewhat increased but stable with pacing impedance  stable.  It was felt at this point that the patient had done well for 11  years without an ICD shock, and because she is otherwise feeling quite well  without symptoms, he recommended that she not undergo treatment with  antiarrhythmic drug therapy.  However, if she does develop recurrent ICD  discharges or tachypalpitations, then antiarrhythmic therapy would certainly  be warranted, and he would be happy to see her back.  He discussed options  for treatment with her as needed.   At this point, she had been seen and evaluated by Dr. Alla German who  deemed her stable for discharge home on increase Toprol dose.   CONSULTATIONS:  Electrophysiology consultation on October 31, 2002, by Dr.  Cristopher Peru.  She was seen in consultation for evaluation of syncope in the  setting of V-fib, arrest and ICD discharge.  At that time, it was felt by  Dr. [name] that the patient had done well for 11 years without ICD shock,  and because she was otherwise feeling quite well without symptoms, he recommended she not undergo treatment with antiarrhythmic drug therapy.  However,  she developed recurrent ICD discharge as her tachy palpitations  then and antiarrhythmic therapy would  certainly be warranted, and he would  be happy to see her back to discuss the options for treatment with her as  needed.   PROCEDURES:  Cardiac catheterization on October 30, 2002, by Dr. Quay Burow.  Previously placed stents were found to be patent.  She was found to  have noncritical CAD.  There were plans to continue medical therapy.  She  has normal LV function.   LABORATORY DATA:  TSH normal at 4.649.  CK 153, 113.  MB 4.6, 3.5.  Troponin  0.01, 0.02.  Liver function tests are normal.  Sodium 139, potassium 3.7,  glucose 111, BUN 8, creatinine 0.8 on admission.  White count 5.7,  hemoglobin 14.1, hematocrit 44.1, platelets 290,000.  ProTime 12.3, INR 0.9,  PTT 28.   Chest x-ray October 29, 2002, shows no active disease.   EKG shows sinus bradycardia at 58 beats per minute, nonspecific STT change.   DISCHARGE MEDICATIONS:  1. Aspirin 81 mg daily.  2. Plavix 75 mg once daily.  3. Zestril 10 mg once daily.  4. Toprol XL 50 mg once daily.  5. Vitamin E once daily.  6. Lipitor 10 mg once daily.  7. Paxil 10 mg once daily.  8. Lorazepam 0.5 mg once daily.  9. Citrucel Plus D once daily.  10.      Nexium 40 mg daily.  11.      Demadex one half to one once a day.  12.      Vitamin B-6 once daily.   ACTIVITY:  No strenuous activity, lifting greater than 5 pounds, driving for  three days.   DIET:  Low-cholesterol diet.   The patient will wash her wound site with warm water and soap.  Call [phone] if any bleeding or increased swelling to the groin site.   FOLLOWUP:  Follow up with Dr. Gwenlyn Found, November 21, 2002, at 2:45 p.m.                                                Mary B. Clair Gulling, P.A.-C.    MBE/MEDQ  D:  11/24/2002  T:  11/25/2002  Job:  [id number]   cc:   Vicie Mutters, MD  [phone]  [place] [address]  Fax: [place] [name], M.D.

## 2010-06-03 NOTE — Consult Note (Signed)
NAMEAZZARIA, OLEAR                        ACCOUNT NO.:  [id number]   MEDICAL RECORD NO.:  [id number]                   PATIENT TYPE:  INP   LOCATION:  2901                                 FACILITY:  [facility]   PHYSICIAN:  Champ Mungo. [name], M.D.               DATE OF BIRTH:  1931/04/09   DATE OF CONSULTATION:  12/29/2002  DATE OF DISCHARGE:                                   CONSULTATION   REASON FOR CONSULTATION:  Ms. Clevinger is referred for evaluation of  symptomatic nonsustained VF.   REFERRING PHYSICIAN:  Richard A. Rollene Fare, M.D.   HISTORY OF PRESENT ILLNESS:  The patient is a 77 year old woman with a  history of VF arrest in 1993.  At that time, she was thought to have an  acute MI secondary to spasm with catheterization showing normal coronary  arteries with preserved LV function.  She underwent IC generator change  initially in 1997 and then again in August of 2003.  In the interim, she  developed coronary disease and underwent angioplasty with stenting in the  Summer of 2003.  She has preserved LV systolic function.  She was in her  usual state of health until October of 2004 when she had a syncopal episode  and on interrogation of her defibrillator found to have a VF arrest which  was successfully terminated with a 31 joule ICD shock.  She was admitted to  the hospital and at that time it was recommended that her beta-blocker dose  be increased.  After discharge, the patient went home and felt somewhat  increased weakness and self-discontinued her increased beta-blocker dose.  Her weakness did not resolve with reduction in her beta-blocker dose.  The  patient was in her usual state of health until today.  She notes that she  has several episodes of diarrhea nearly daily and has been evaluated in the  past by Loralee Pacas. Sharlett Iles, M.D.  Today, she had two episodes of severe  dizziness associated with near syncope.  She called Dr. Lowella Fairy office  where she was evaluated and  found to have two episodes of nonsustained  ventricular fibrillation.  Like her episode back in October which did not  terminate spontaneously, these episodes were not initiated with a long-short-  long coupled sequence.  Interestingly, the PVC morphology was quite narrow  and had the same morphology each time.  The patient is admitted for  additional evaluation.  Initial screening labs did not demonstrate any  electrolyte abnormalities.  With her second episode, her ventricular pacing  rate was increased to 90 beats per minute (VVI with underlying sinus  rhythm).  She is admitted for additional evaluation.  The patient notes that  over the last week or two she has had some very mild and subtle substernal  chest tightness and shortness of breath.  This has, however, been very mild  and is not related to exertion.  She does admit to being under increased  stress recently.  She denies frank syncope since her episode back in  October.  She denies cough or claudication.   PAST MEDICAL HISTORY:  Notable for chronic diarrhea.  I note that in the  past she was on quinidine and apparently this was discontinued and by report  also secondary to diarrhea although it sounds like her diarrhea has not  changed significantly.  She has a history of hypertension and  hyperlipidemia.   MEDICATIONS ON ADMISSION:  Her medications on admission today include  Zestril, Toprol-XL, Paxil, and multivitamins.  I believe she is also on  Plavix.   Her past medical history is as previously noted.   FAMILY HISTORY:  Noncontributory.   SOCIAL HISTORY:  The patient is married and denies tobacco abuse.  She  denies alcohol abuse.   REVIEW OF SYSTEMS:  Is notable for no vision and hearing problems.  She  denies any difficulty swallowing or vomiting.  She does have very mild  nausea at times.  She denies any arthritic complaints.  She denies any skin  changes.  Her cardiopulmonary review of systems is as previously  noted.  She  denies productive cough.  She denies any polyuria or polydipsia.  She denies  any recent weight changes.  She denies any neurologic symptoms except for  the recurrent dizzy spells associated with her arrhythmia.   PHYSICAL EXAMINATION:  GENERAL:  She is a pleasant well-appearing 77-year-  old woman in no distress.  VITAL SIGNS:  Blood pressure was 130/80, pulse 90 and regular, respirations  are 18.  HEENT:  Normocephalic and atraumatic.  The pupils are equal and round.  The  oropharynx is moist.  The sclerae anicteric.  NECK:  No jugular venous distention.  The carotids are 2+ and symmetric.  The trachea was midline.  LUNGS:  Clear bilaterally to auscultation.  There are no wheezes, rales, or  rhonchi.  CARDIOVASCULAR:  Regular rate and rhythm with a normal S1 & S2.  ABDOMEN:  Soft, nontender, nondistended.  There is no organomegaly.  EXTREMITIES:  Demonstrated no clubbing or cyanosis.  There was very mild  peripheral edema more on the left than the right.  NEUROLOGICAL:  Nonfocal.   EKG demonstrates underlying sinus rhythm with a QT interval between 430 and  430 milliseconds.  The second EKG demonstrates underlying sinus rhythm with  ventricular pacing (asynchronously).   Interrogation of her defibrillator demonstrates her prior episode back in  October for which she received an ICD shock which was initially with a  fairly narrow PVC.  The two episodes that occurred today were of course self-  limited and did not result in an ICD shock.  They were also initiated with  the same PVC which was fairly narrow in morphology.  The episodes were not  initiated with a long pause.   IMPRESSION:  1. Recurrent, symptomatic, nonsustained episodes of ventricular     fibrillation.  2. History of arrhythmic cardiac arrest status post implantable cardioverter    defibrillator insertion 1993 with a generator change out in 1997 as well     as in August of 2003.  3. Coronary disease  status post angioplasty in July of 2003 with     catheterization in October of 2004 revealing 40% right coronary artery     stenosis with patent stents in her left anterior descending.  4. Appropriate implantable cardioverter defibrillator shock for ventricular     fibrillation in October  of 2004.  5. Chronic diarrhea and irritable bowel.  6. Preserved left ventricular function.   DISCUSSION:  Review of the patient's episodes from today reveal that her  events were nonsustained, that they were not pause-dependent, they were  polymorphic, and there was no clear prolongation of the QT interval.  Interestingly, the PVCs initiating the event were fairly narrow.  I suspect  the patient has a primary VF syndrome with concomitant coronary disease.  These may well be secondary to the firing of a Purkinje fiber as  demonstrated by her narrow ORS initiated PVC.  Because of her known coronary  disease and because of a recent increase in her chest pain type syndrome, I  think we are obligated to exclude a new coronary lesion either by  catheterization or stress Cardiolite.  As it pertains to possible medical  therapies, quinidine plus or minus amiodarone will be a very intriguing  treatment option versus quinidine plus or minus a beta-blocker versus a beta-  blocker alone.  I plan to discuss these issues with Deboraha Sprang, M.D.  and Richard A. Rollene Fare, M.D. and come up with a decision about treatment  options.  I note that the patient was said in the past to have diarrhea on  quinidine; however, the patient has chronic diarrhea and I wonder if  quinidine had any role in her diarrhea.                                               Champ Mungo. [name], M.D.    GWT/MEDQ  D:  12/29/2002  T:  12/30/2002  Job:  [id number]   cc:   Vicie Mutters, MD  [phone]  [place] [address]  Fax: [phone]   Quay Burow, M.D.  Fax: [place], R.N. [facility]   Richard A. Rollene Fare, M.D.   [phone], [place] [address]  Fax: [phone]

## 2010-06-03 NOTE — Cardiovascular Report (Signed)
[name], [name]                        ACCOUNT NO.:  [id number]   MEDICAL RECORD NO.:  [id number]                   PATIENT TYPE:  INP   LOCATION:  2040                                 FACILITY:  [facility]   PHYSICIAN:  Quay Burow, M.D.                DATE OF BIRTH:  09/06/1931   DATE OF PROCEDURE:  DATE OF DISCHARGE:                              CARDIAC CATHETERIZATION   PROCEDURE PERFORMED:  Cardiac catheterization.   CARDIOLOGIST:  Quay Burow, M.D.   INDICATIONS FOR PROCEDURE:  [name] is a 77 year old white female with  history of AICD placement in 1993.  She has had LAD and ramus branch  stenting.  Her last cath was performed July 29, 2001 and revealed patent  stents.  Her ICD was changed out at [facility] apparently last year.  She  presented with syncope yesterday and came to the office for evaluation.  Interrogation of her ICD revealed ventricular fibrillation, which was  appropriately recognized and shocked by to sinus rhythm.  The patient's  enzymes were negative.  She presents now for diagnostic coronary  arteriography to rule out ishemic etiology followed by EP evaluation.   PROCEDURE DESCRIPTION:  The patient was brought to the second floor [facility] cardiac cath lab in the post absorptive state.  She was premedicated  with p.o. Valium.  The right groin was prepped and shaved in the usual  sterile fashion.  One percent Xylocaine was used for local anesthesia.  A 6  French sheath was inserted into the right femoral artery using the standard  Seldinger technique.  Six French right and left Judkins Silastic catheters  as well as a [place] pigtail catheter were used for selective coronary  angiography and left ventriculography respectively.  Omnipaque dye was used  for the entirety of the case.  Retrograde aortic and ventricular pullback  pressures were recorded.   HEMODYNAMIC DATA:  1. Aortic systolic pressure 99991111, diastolic pressure 123XX123.  2. Left  ventricular systolic pressure 123XX123, end-diastolic pressure 28.   SELECTIVE CORONARY ANGIOGRAPHY:  1. Left Main:  Left main normal.   1. LAD:  Proximal LAD stent was widely patent.   1. Ramus Intermedius Branch:  Proximal ramus branch stent was widely patent.   1. Left Circumflex:  Nondominant and free of systemic disease.   1. Right Coronary Artery:  A large dominant vessel with 40% segmental mid     stenosis; unchanged from prior angiogram.   LEFT VENTRICULOGRAPHY:  Left ventriculogram was performed using 25 mL of  Omnipaque dye at 12 mL/second.  The overall LVEF was estimated at greater  than 60% without focal wall motion abnormalities.   IMPRESSION:  [name] has widely patent coronary stents with noncritical  coronary artery disease and normal left ventricular function.  I am not sure  of the etiology of her ventricular fibrillation.  It may be related to  relative hypokalemia.  Next, EF was measured and the sheaths were removed.  Pressure was held on  the groin to achieve hemostasis.  The patient left the lab in stable  condition.   The patient will be evaluated by the electrophysiologic service prior to  discharge.                                                 Quay Burow, M.D.    JB/MEDQ  D:  10/30/2002  T:  10/30/2002  Job:  [id number]   cc:   [facility]   [address], [place]   Vicie Mutters, MD  [place]  [place] [address]  Fax: [phone]

## 2010-06-03 NOTE — H&P (Signed)
[place]  Patient:    [name], [name]                     MRN: [id number] Adm. Date:  [id number] Attending:  Jeannine Kitten CC:         Warnell Forester, M.D., [facility].  Lorretta Harp, M.D.  Nikki Dom, M.D., [facility]   History and Physical  CHIEF COMPLAINT:  Broken pacemaker lead.  HISTORY OF PRESENT ILLNESS:  This 77 year old woman says that she reported to her primary care physician, Dr. Warnell Forester, at [facility] a week or more ago, with a complaint of "hurting under the rib cage". She said that she had had no fever, but that she had discomfort on both sides in her lower ribs with sneezing or cough, and some with moving her torso. This failed to resolve and she saw Dr. Brien Mates again on Monday of this week, April 1. Apparently a chest x-ray was done at that time. The patient received a call from Dr. Brien Mates today informing her that the radiologist felt that one of the leads in her AICD was broken. It was not felt safe to let her remain at home over the weekend with a fractured lead and a history of ventricular tachycardia. The pacemaker replacement generator was implanted by Dr. Virl Axe, who is not on-call at this time. Her cardiologist is Dr. Quay Burow.  Her cardiac  history includes the fact that in 1993, she fainted at work and she was admitted to [facility] and had multiple episodes of recurrent ventricular tachycardia requiring cardioversion and then received an ICD9 at [facility] in [place]. In 1997, the generator and one lead were replaced. She also required a stent to the ramus intermedius artery in 1999, that was in December. Otherwise the only surgery she has had was hemorrhoid surgery.  She says that her first pacemaker fired twice. She had been on Betapace for a while but did not tolerate that. Her new pacemaker apparently has revealed  two episodes of tachycardia which did not reach threshold and the current pacemaker has never fired as far as she knows.  CURRENT MEDICATIONS:  Zestril 10 mg daily, Norvasc 5 mg daily, Ativan 0.5 mg daily, Paxil 10 mg daily, Lipitor 10 mg daily, aspirin 81 mg daily, vitamin E, Centrum silver, Citrucel, Claritin and Zantac on a p.r.n. basis.  FAMILY HISTORY:  Her mother died of a stroke at 91. Her father died at 61 after being ill for about a year. She had four brothers and sisters, one brother died of a heart attack. There is no other known heart disease in the family.  Not mentioned above is that she did have a heart catheterization at the time of her first presentation and had no significant coronary artery disease at that time.  SOCIAL HISTORY:  She is a widow, her husband died 07/10/22 a year ago of leukemia. She has four children who are grown up. She does her own housework and yard work. She has been doing quite a bit of yard work recently.  REVIEW OF SYSTEMS:  CONSTITUTIONAL:  She denies fevers or chills or sweats. She denies claudication. She has occasional ankle swelling which resolves at night. She said she did not sleep well last night and may have had some shortness of breath. EYES:  No diplopia or blurring. She wears glasses for reading. ENT:  No deafness or dizziness.  She has full dentures. CARDIOVASCULAR:  See the history of the present illness. RESPIRATORY:  No wheezing. She has had some cough recently as noted above. She has never smoked. GI:  She has had mild loose stools for about three days. She does have a history of heartburn and acid reflux. GU:  No dysuria or pyuria. MUSCULOSKELETAL:  No swollen joints of myalgia. She does say that she is sore after working in the yard. She doesnt feel that she is more sore than she was last year at the same time. SKIN/BREASTS:  No nodules. She had shingles about six weeks ago affecting the left T12-L1 area, and still has a bit of  herpes zoster there. NEUROLOGIC:  No faintness or syncope. She does have paresthesias in her right hand, and sometimes had numbness of the small finger. PSYCHIATRIC:  She takes Paxil at this time. ENDOCRINE:  No known diabetes or thyroid disease. LYMPH NODES:  No swelling in the neck, axillae or groin. ALLERGIC/LYMPHATIC:  She thinks that she had a little reaction to dye when there was a question of a kidney stone. She also was intolerant of Betapace for reasons which are unclear at this time. She has a tendency to spring rhinorrhea.  PHYSICAL EXAMINATION:  VITAL SIGNS:  Blood pressure 140/75, respirations 20, pulse 75 and regular.  GENERAL:  She is a well-developed, well-nourished woman who looks her stated age of 77. She is oriented to person, place and time. Her mood and affect are appropriate.  HEENT:  Her conjunctivae and lids reveal no xanthelasma, icterus or arcus senilis. She has full dentures. The oral mucosa reveals no pallor or cyanosis.  NECK:  Supple and symmetrical. The trachea is midline and mobile. There is no palpable thyromegaly or cervical node, no carotid bruit or JVD.  RESPIRATIONS:  Her respiratory effort is normal. Her lungs are clear to auscultation and percussion.  BACK:  Diffusely tender over the spine and the ribs, but without focal tenderness.  NEUROLOGIC:  Her gait is not tested. She probably could undergo a stress test. Muscle strength and tone are age appropriate. The cardiac apical impulse is cryptic. The left border of cardiac dullness is within the left anterior axillary line.  HEART:  Rhythm regular and rate is normal. There is no gallop, click or murmur.  EXTREMITIES:  Her digits and nails reveal no clubbing or cyanosis. Skin and subcutaneous tissue reveal no stasis dermatitis or ulcer. There are numerous typical herpetic lesions, healing ones over the left flank. The pedal pulses  are intact 1-2+ DP and PT bilaterally. The femoral pulses  are intact without bruits. Her legs reveal no edema or varicosity.  ABDOMEN:  Moderately obese and nontender. The pacemaker generator box is in the left upper quadrant anteriorly, and the skin is well healed. There is no abdominal tenderness. There is no palpable enlargement of the liver or spleen. The abdominal aorta is without bruit.  Her EKG shows sinus rhythm with marked left axis deviation and left anterior hemiblock. There is an intra-atrial conduction delay and nonspecific QRS widening. There is no evidence of pacemaker activity. The chest x-ray does appear to show a discontinuity in one of the leads.  Ms. Dingus is admitted for observation and emergency treatment if necessary in regard to her apparent fractured lead. As I have explained to her and her family, the definitive test here is electrophysiologic analysis of the lead system, and that will have to wait until the personnel capable of providing that service  are available. DD:  04/20/00 TD:  04/21/00 Job: [id number]

## 2010-06-08 ENCOUNTER — Other Ambulatory Visit: Payer: Self-pay | Admitting: Family Medicine

## 2010-06-14 ENCOUNTER — Other Ambulatory Visit: Payer: Self-pay | Admitting: Family Medicine

## 2010-06-14 DIAGNOSIS — N924 Excessive bleeding in the premenopausal period: Secondary | ICD-10-CM

## 2010-06-22 ENCOUNTER — Other Ambulatory Visit (HOSPITAL_COMMUNITY): Payer: Medicare Other

## 2010-06-24 ENCOUNTER — Ambulatory Visit (HOSPITAL_COMMUNITY)
Admission: RE | Admit: 2010-06-24 | Discharge: 2010-06-24 | Disposition: A | Discharge status: Home / Self Care | Payer: Medicare Other | Source: Ambulatory Visit | Attending: Family Medicine | Admitting: Family Medicine

## 2010-06-24 ENCOUNTER — Other Ambulatory Visit: Payer: Self-pay | Admitting: Family Medicine

## 2010-06-24 DIAGNOSIS — N950 Postmenopausal bleeding: Secondary | ICD-10-CM | POA: Insufficient documentation

## 2010-06-24 DIAGNOSIS — R1011 Right upper quadrant pain: Secondary | ICD-10-CM | POA: Insufficient documentation

## 2010-06-24 DIAGNOSIS — N924 Excessive bleeding in the premenopausal period: Secondary | ICD-10-CM

## 2010-06-24 DIAGNOSIS — I10 Essential (primary) hypertension: Secondary | ICD-10-CM | POA: Insufficient documentation

## 2010-06-27 ENCOUNTER — Encounter: Payer: Self-pay | Admitting: Vascular Surgery

## 2010-06-30 ENCOUNTER — Other Ambulatory Visit: Payer: Self-pay | Admitting: Family Medicine

## 2010-06-30 DIAGNOSIS — Z1231 Encounter for screening mammogram for malignant neoplasm of breast: Secondary | ICD-10-CM

## 2010-07-21 ENCOUNTER — Ambulatory Visit
Admission: RE | Admit: 2010-07-21 | Discharge: 2010-07-21 | Disposition: A | Discharge status: Home / Self Care | Payer: Medicare Other | Source: Ambulatory Visit | Attending: Family Medicine | Admitting: Family Medicine

## 2010-07-21 DIAGNOSIS — Z1231 Encounter for screening mammogram for malignant neoplasm of breast: Secondary | ICD-10-CM

## 2010-08-29 ENCOUNTER — Encounter (HOSPITAL_COMMUNITY): Payer: Self-pay

## 2010-08-29 ENCOUNTER — Encounter (HOSPITAL_COMMUNITY)
Admission: RE | Admit: 2010-08-29 | Discharge: 2010-08-29 | Disposition: A | Discharge status: Home / Self Care | Payer: Medicare Other | Source: Ambulatory Visit | Attending: Obstetrics and Gynecology | Admitting: Obstetrics and Gynecology

## 2010-08-29 HISTORY — DX: Unspecified osteoarthritis, unspecified site: M19.90

## 2010-08-29 HISTORY — DX: Gastro-esophageal reflux disease without esophagitis: K21.9

## 2010-08-29 HISTORY — DX: Acute myocardial infarction, unspecified: I21.9

## 2010-08-29 HISTORY — DX: Essential (primary) hypertension: I10

## 2010-08-29 HISTORY — DX: Presence of automatic (implantable) cardiac defibrillator: Z95.810

## 2010-08-29 LAB — BASIC METABOLIC PANEL
BUN: 17 mg/dL (ref 6–23)
Chloride: 104 mEq/L (ref 96–112)
GFR calc Af Amer: 40 mL/min — ABNORMAL LOW (ref >60)
GFR calc non Af Amer: 33 mL/min — ABNORMAL LOW (ref >60)
Potassium: 5.2 mEq/L — ABNORMAL HIGH (ref 3.5–5.1)
Sodium: 139 mEq/L (ref 135–145)

## 2010-08-29 LAB — CBC
HCT: 38.7 % (ref 36.0–46.0)
MCHC: 32.6 g/dL (ref 30.0–36.0)
RDW: 13.6 % (ref 11.5–15.5)
WBC: 6.8 10*3/uL (ref 4.0–10.5)

## 2010-08-29 NOTE — H&P (Signed)
[name], [name] NO.:  [id number]  MEDICAL RECORD NO.:  [id number]  LOCATION:  [facility]                           FACILITY:  [facility]  PHYSICIAN:  Ralene Bathe. Matthew Saras, M.D.DATE OF BIRTH:  1931-04-25  DATE OF ADMISSION:  09/02/2010 DATE OF DISCHARGE:                             HISTORY & PHYSICAL   CHIEF COMPLAINT:  Postmenopausal bleeding.  HISTORY OF PRESENT ILLNESS:  A 77 year old postmenopausal patient who had some minimal postmenopausal spotting, was worked up by Dr. Laurance Flatten, her PCP.  They tried to perform Pipelle endometrial biopsy but due to stenosis, they could not.  An ultrasound was done that showed probable endometrial polyp and a small amount of fluid.  No other abnormalities were noted.  They did do a Pap test which was normal.  She presents now for D and C hysteroscopy.  This procedure including risks related to bleeding, infection, perforation, may require open additional surgery, all discussed with her which she understands and accepts.  PAST MEDICAL HISTORY:  Allergies:  None.  CURRENT MEDICATIONS:  Zestril, metoprolol, Norpace, Plavix, Crestor, Demadex p.r.n., omeprazole daily, calcium, vitamin D, baby aspirin daily, Paxil.  OBSTETRICAL HISTORY:  She has had 4 deliveries vaginally.  REVIEW OF SYSTEMS:  Significant for a history of heart disease, IBS, phlebitis, diverticulosis, and UTI.  SOCIAL HISTORY:  Denies alcohol, tobacco, or drug use.  Chipper Herb, MD is her medical doctor.  PHYSICAL EXAMINATION:  VITAL SIGNS:  Temperature 98.2, blood pressure 110/70. HEENT:  Unremarkable. NECK:  Supple without masses. LUNGS:  Clear. CARDIOVASCULAR:  Regular rate and rhythm without murmurs, rubs, or gallops. BREASTS:  Without masses. ABDOMEN:  Soft, flat, and nontender. PELVIC:  Normal external genitalia.  Vagina and cervix clear.  Uterus midposition, normal size.  Adnexa negative. EXTREMITIES:  Unremarkable. NEUROLOGIC:   Unremarkable.  IMPRESSION:  Postmenopausal spotting, minimal, possible endometrial polyp by ultrasound, cervical stenosis.  PLAN:  Outpatient D and C hysteroscopy procedure and risks reviewed as above.     Chris Narasimhan M. Matthew Saras, M.D.     [facility]MED[facility]  D:  08/29/2010  T:  08/29/2010  Job:  [id number]

## 2010-08-29 NOTE — Pre-Procedure Instructions (Addendum)
Requested last ECHO, stress test, Catheterization from [facility] per request of Dr Dawayne Cirri, Requested Cardiac Clearance from [facility] per request of Dr Dawayne Cirri, Perioperative Prescription for implanted cardiac device programming faxed to [facility]. 08/29/10-received ekg,echo,stress,cath results from [facility]-reviewed per Dr Genevie Ann follow up prescription for the care of the ICD and Cardiac Clearance [facility]

## 2010-08-29 NOTE — H&P (Signed)
[name]  DICTATION # [id number] CSN# [id number]   Margarette Asal, MD 08/29/2010 1:29 PM

## 2010-08-29 NOTE — Patient Instructions (Addendum)
[place]  Your procedure is scheduled [id number] Enter through the [employer] of [facility] at:6am Pick up the phone a the desk and dial [phone] Please call this number if you have any problems the morning of surgery: [phone]  Remember: Do not eat food after midnight  Do not drink clear liquids after:midnight Take these medicines the morning of surgery with a SIP OF WATER:except for medications instructed by Anesthesiologist  Do not wear jewelry, make-up, or nail polish Do not wear lotions, powders, or perfumes. Do not shave 48 hours prior to surgery. Do not bring valuables to the hospital.  Patients discharged on the day of surgery will not be allowed to drive home.   Name and phone number of your driver:Daughter in [place] [phone]  Please read over the Hibiclens Guide to General Skin Cleansing at Home. Remember that hibiclens is not to be used on the head, face, or vaginal area. Please shower with 1/2 bottle the evening before surgery and other 1/2 bottle morning of surgery prior to arrival at hospital.  Do not use any deodorants, lotions, or powders after hibiclens shower.

## 2010-08-29 NOTE — Anesthesia Preprocedure Evaluation (Signed)
Anesthesia Evaluation  Name, MR# and DOB Patient awake  General Assessment Comment  Reviewed: Allergy & Precautions, H&P , Patient's Chart, lab work & pertinent test results, reviewed documented beta blocker date and time   History of Anesthesia Complications Negative for: history of anesthetic complications  Airway Mallampati: III TM Distance: >3 FB Neck ROM: full    Dental No notable dental hx.    Pulmonary  clear to auscultation  pulmonary exam normalPulmonary Exam Normal breath sounds clear to auscultation none    Cardiovascular Exercise Tolerance: Good hypertension, + Past MI + dysrhythmias regular Normal    Neuro/Psych Negative Neurological ROS  Negative Psych ROS  GI/Hepatic/Renal negative GI ROS, negative Liver ROS, and negative Renal ROS (+)  GERD      Endo/Other  Negative Endocrine ROS (+)      Abdominal   Musculoskeletal   Hematology negative hematology ROS (+)   Peds  Reproductive/Obstetrics negative OB ROS    Anesthesia Other Findings Stent x 3            Anesthesia Physical Anesthesia Plan  ASA: III  Anesthesia Plan: General   Post-op Pain Management:    Induction:   Airway Management Planned:   Additional Equipment:   Intra-op Plan:   Post-operative Plan:   Informed Consent: I have reviewed the patients History and Physical, chart, labs and discussed the procedure including the risks, benefits and alternatives for the proposed anesthesia with the patient or authorized representative who has indicated his/her understanding and acceptance.   Dental Advisory Given  Plan Discussed with: CRNA and Surgeon  Anesthesia Plan Comments:        awaiting AICD recs- patient been shocked 8 times with successful response to vtach Awaiting echo/cath/stress test results as well as cardiac clearance MI in 1993 but treated with stents x2- subsequent stent x1 (total stents 3) If AICD  deactivated, should put pads on patients Anesthesia Quick Evaluation

## 2010-09-02 ENCOUNTER — Encounter (HOSPITAL_COMMUNITY): Payer: Self-pay | Admitting: Anesthesiology

## 2010-09-02 ENCOUNTER — Encounter (HOSPITAL_COMMUNITY)
Admission: RE | Disposition: A | Discharge status: Home / Self Care | Payer: Self-pay | Source: Ambulatory Visit | Attending: Obstetrics and Gynecology

## 2010-09-02 ENCOUNTER — Ambulatory Visit (HOSPITAL_COMMUNITY)
Admission: RE | Admit: 2010-09-02 | Discharge: 2010-09-02 | Disposition: A | Discharge status: Home / Self Care | Payer: Medicare Other | Source: Ambulatory Visit | Attending: Obstetrics and Gynecology | Admitting: Obstetrics and Gynecology

## 2010-09-02 ENCOUNTER — Other Ambulatory Visit: Payer: Self-pay | Admitting: Obstetrics and Gynecology

## 2010-09-02 ENCOUNTER — Ambulatory Visit (HOSPITAL_COMMUNITY): Payer: Medicare Other | Admitting: Anesthesiology

## 2010-09-02 DIAGNOSIS — Z01818 Encounter for other preprocedural examination: Secondary | ICD-10-CM | POA: Insufficient documentation

## 2010-09-02 DIAGNOSIS — Z01812 Encounter for preprocedural laboratory examination: Secondary | ICD-10-CM | POA: Insufficient documentation

## 2010-09-02 DIAGNOSIS — N950 Postmenopausal bleeding: Secondary | ICD-10-CM | POA: Insufficient documentation

## 2010-09-02 DIAGNOSIS — N840 Polyp of corpus uteri: Secondary | ICD-10-CM | POA: Insufficient documentation

## 2010-09-02 HISTORY — PX: HYSTEROSCOPY WITH D & C: SHX1775

## 2010-09-02 LAB — LIPID PANEL
LDL Cholesterol: 58 mg/dL (ref 0–99)
Total CHOL/HDL Ratio: 2.8 RATIO
VLDL: 34 mg/dL (ref 0–40)

## 2010-09-02 SURGERY — DILATATION AND CURETTAGE /HYSTEROSCOPY
Anesthesia: Choice | Wound class: Clean Contaminated

## 2010-09-02 MED ORDER — LIDOCAINE HCL 1 % IJ SOLN
INTRAMUSCULAR | Status: DC | PRN
  Administered 2010-09-02: 10 mL

## 2010-09-02 MED ORDER — MIDAZOLAM HCL 2 MG/2ML IJ SOLN
INTRAMUSCULAR | Status: AC
  Filled 2010-09-02: qty 2

## 2010-09-02 MED ORDER — LIDOCAINE HCL (CARDIAC) 20 MG/ML IV SOLN
INTRAVENOUS | Status: DC | PRN
  Administered 2010-09-02: 40 mg via INTRAVENOUS

## 2010-09-02 MED ORDER — FENTANYL CITRATE 0.05 MG/ML IJ SOLN: 25.0000 ug | INTRAMUSCULAR | Status: DC | PRN

## 2010-09-02 MED ORDER — DEXAMETHASONE SODIUM PHOSPHATE 10 MG/ML IJ SOLN
INTRAMUSCULAR | Status: AC
  Filled 2010-09-02: qty 1

## 2010-09-02 MED ORDER — PHENYLEPHRINE 40 MCG/ML (10ML) SYRINGE FOR IV PUSH (FOR BLOOD PRESSURE SUPPORT)
PREFILLED_SYRINGE | INTRAVENOUS | Status: AC
  Filled 2010-09-02: qty 5

## 2010-09-02 MED ORDER — CEFAZOLIN SODIUM 1-5 GM-% IV SOLN: 1.0000 g | INTRAVENOUS | Status: DC

## 2010-09-02 MED ORDER — ONDANSETRON HCL 4 MG/2ML IJ SOLN
INTRAMUSCULAR | Status: DC | PRN
  Administered 2010-09-02: 4 mg via INTRAVENOUS

## 2010-09-02 MED ORDER — LACTATED RINGERS IV SOLN
INTRAVENOUS | Status: DC
  Administered 2010-09-02 (×2): via INTRAVENOUS

## 2010-09-02 MED ORDER — LIDOCAINE HCL (CARDIAC) 20 MG/ML IV SOLN
INTRAVENOUS | Status: AC
  Filled 2010-09-02: qty 5

## 2010-09-02 MED ORDER — EPHEDRINE SULFATE 50 MG/ML IJ SOLN
INTRAMUSCULAR | Status: DC | PRN
  Administered 2010-09-02 (×2): 10 mg via INTRAVENOUS

## 2010-09-02 MED ORDER — FENTANYL CITRATE 0.05 MG/ML IJ SOLN
INTRAMUSCULAR | Status: AC
  Filled 2010-09-02: qty 5

## 2010-09-02 MED ORDER — CEFAZOLIN SODIUM 1-5 GM-% IV SOLN
INTRAVENOUS | Status: DC | PRN
  Administered 2010-09-02: 1 g via INTRAVENOUS

## 2010-09-02 MED ORDER — DEXTROSE IN LACTATED RINGERS 5 % IV SOLN: INTRAVENOUS | Status: DC

## 2010-09-02 MED ORDER — KETOROLAC TROMETHAMINE 30 MG/ML IJ SOLN
INTRAMUSCULAR | Status: DC | PRN
  Administered 2010-09-02: 15 mg via INTRAVENOUS

## 2010-09-02 MED ORDER — ONDANSETRON HCL 4 MG/2ML IJ SOLN
INTRAMUSCULAR | Status: AC
  Filled 2010-09-02: qty 2

## 2010-09-02 MED ORDER — METHYLERGONOVINE MALEATE 0.2 MG/ML IJ SOLN
INTRAMUSCULAR | Status: AC
  Filled 2010-09-02: qty 1

## 2010-09-02 MED ORDER — EPHEDRINE 5 MG/ML INJ
INTRAVENOUS | Status: AC
  Filled 2010-09-02: qty 10

## 2010-09-02 MED ORDER — GLYCINE 1.5 % IR SOLN
Status: DC | PRN
  Administered 2010-09-02: 1

## 2010-09-02 MED ORDER — PROPOFOL 10 MG/ML IV EMUL
INTRAVENOUS | Status: AC
  Filled 2010-09-02: qty 20

## 2010-09-02 MED ORDER — PROPOFOL 10 MG/ML IV EMUL
INTRAVENOUS | Status: DC | PRN
  Administered 2010-09-02: 150 mg via INTRAVENOUS

## 2010-09-02 MED ORDER — PHENYLEPHRINE HCL 10 MG/ML IJ SOLN
INTRAMUSCULAR | Status: DC | PRN
  Administered 2010-09-02 (×2): 40 ug via INTRAVENOUS

## 2010-09-02 MED ORDER — FENTANYL CITRATE 0.05 MG/ML IJ SOLN
INTRAMUSCULAR | Status: DC | PRN
  Administered 2010-09-02 (×2): 50 ug via INTRAVENOUS

## 2010-09-02 SURGICAL SUPPLY — 15 items
CANISTER SUCTION 2500CC (MISCELLANEOUS) ×2 IMPLANT
CATH ROBINSON RED A/P 16FR (CATHETERS) ×2 IMPLANT
CLOTH BEACON ORANGE TIMEOUT ST (SAFETY) ×2 IMPLANT
CONTAINER PREFILL 10% NBF 60ML (FORM) ×4 IMPLANT
DRAPE UTILITY XL STRL (DRAPES) ×2 IMPLANT
ELECT REM PT RETURN 9FT ADLT (ELECTROSURGICAL)
ELECTRODE REM PT RTRN 9FT ADLT (ELECTROSURGICAL) IMPLANT
ELECTRODE ROLLER BARREL 22FR (ELECTROSURGICAL) IMPLANT
ELECTRODE VAPORCUT 22FR (ELECTROSURGICAL) IMPLANT
GLOVE BIO SURGEON STRL SZ7 (GLOVE) ×4 IMPLANT
GOWN PREVENTION PLUS LG XLONG (DISPOSABLE) ×4 IMPLANT
LOOP ANGLED CUTTING 22FR (CUTTING LOOP) IMPLANT
PACK HYSTEROSCOPY LF (CUSTOM PROCEDURE TRAY) ×2 IMPLANT
TOWEL OR 17X24 6PK STRL BLUE (TOWEL DISPOSABLE) ×4 IMPLANT
WATER STERILE IRR 1000ML POUR (IV SOLUTION) ×2 IMPLANT

## 2010-09-02 NOTE — Op Note (Signed)
Preoperative diagnosis: Postmenopausal bleeding, possible endometrial polyp  Postoperative diagnosis: Same  Procedure: D&C hysteroscopy  Surgeon: Matthew Saras  Specimens removed: Endometrial curettings, endometrial polyp to pathology  Complications: None  EBL: Minimal  Procedure and findings:  Patient to the operating room, after an adequate level of general anesthesia was obtained, with the legs in stirrups, the perineum and vagina were prepped and draped in the usual manner for vaginal procedures. The bladder was drained, UA carried out the uterus was small midposition adnexa negative. Cervix was grasped with a anteriorly with a tenaculum, paracervical block. By infiltrating and 3 9:00 submucosally of the 7 cc of 1% Xylocaine at each site after negative aspiration. There was some minimal cervical stenosis, over, by slow careful dilatation with the smaller Hegar dilators the uterus was then sounded to 7 cm progressively dilated to a 24-26 Pratt dilator. Continuous flow hysteroscopy was carried out revealing a thin endometrium but there was an endometrial polyp at the left fundal area. Pipelle endometrial biopsy carried out followed by sharp curettage the scope was reinserted the polyp was still noted to be there was grasped with a hysteroscopic grasper in a false and sent along with endometrial curettings. The scope was reinserted and the cavity was noted to be clean minimal bleeding she tolerated this well went to recovery room in good condition.  Dictated with [facility] [employer], not proofread. Keondria Siever M. HollandMD

## 2010-09-02 NOTE — Transfer of Care (Signed)
Immediate Anesthesia Transfer of Care Note  Patient: [name]  Procedure(s) Performed:  DILATATION AND CURETTAGE (D&C) /HYSTEROSCOPY - Dilation and Curettage with Hysteroscopy and Polypectomy  Patient Location: PACU  Anesthesia Type: General  Level of Consciousness: awake, alert  and oriented  Airway & Oxygen Therapy: Patient Spontanous Breathing and Patient connected to nasal cannula oxygen  Post-op Assessment: Report given to PACU RN and Post -op Vital signs reviewed and stable  Post vital signs: Reviewed and stable  Complications: No apparent anesthesia complications

## 2010-09-02 NOTE — Progress Notes (Signed)
No change in status.

## 2010-09-02 NOTE — Anesthesia Postprocedure Evaluation (Signed)
Anesthesia Post Note  Patient: [name]  Procedure(s) Performed:  DILATATION AND CURETTAGE (D&C) /HYSTEROSCOPY - Dilation and Curettage with Hysteroscopy and Polypectomy  Anesthesia type: General  Patient location: PACU  Post pain: Pain level controlled  Post assessment: Post-op Vital signs reviewed  Last Vitals:  Filed Vitals:   09/02/10 0900  BP: 146/73  Pulse: 63  Temp: 97.6 F (36.4 C)  Resp: 26    Post vital signs: Reviewed  Level of consciousness: sedated  Complications: No apparent anesthesia complications

## 2010-09-06 ENCOUNTER — Encounter (HOSPITAL_COMMUNITY): Payer: Self-pay | Admitting: Obstetrics and Gynecology

## 2010-10-25 LAB — DIFFERENTIAL
Basophils Relative: 1
Lymphocytes Relative: 37
Monocytes Absolute: 0.6
Monocytes Relative: 10
Neutro Abs: 2.6

## 2010-10-25 LAB — CBC
HCT: 38.9
Hemoglobin: 13.4
Hemoglobin: 13.9
MCV: 96
MCV: 98.3
RBC: 3.96
RBC: 4.39
RBC: 4.39
RDW: 13.6
WBC: 5.9
WBC: 6.6
WBC: 7.6

## 2010-10-25 LAB — COMPREHENSIVE METABOLIC PANEL
AST: 21
Albumin: 3.5
Alkaline Phosphatase: 70
BUN: 12
GFR calc Af Amer: >60
Potassium: 3.9
Total Protein: 7

## 2010-10-25 LAB — BASIC METABOLIC PANEL
BUN: 16
CO2: 28
Calcium: 9.3
Chloride: 105
Chloride: 106
Creatinine, Ser: 0.99
GFR calc Af Amer: 54 — ABNORMAL LOW
GFR calc Af Amer: 60 — ABNORMAL LOW
GFR calc Af Amer: >60
GFR calc non Af Amer: 45 — ABNORMAL LOW
Glucose, Bld: 84
Potassium: 4.5
Potassium: 4.7
Sodium: 141
Sodium: 137

## 2010-10-25 LAB — CLOSTRIDIUM DIFFICILE EIA

## 2010-10-25 LAB — CARDIAC PANEL(CRET KIN+CKTOT+MB+TROPI)
CK, MB: 1.6
Relative Index: INVALID
Total CK: 77

## 2010-10-25 LAB — PROTIME-INR
INR: 0.9
Prothrombin Time: 12.8

## 2010-10-25 LAB — LIPID PANEL
LDL Cholesterol: 84
Triglycerides: 99

## 2010-10-25 LAB — CK TOTAL AND CKMB (NOT AT ARMC)
CK, MB: 2.2
Total CK: 94

## 2010-10-25 LAB — STOOL CULTURE

## 2010-10-25 LAB — B-NATRIURETIC PEPTIDE (CONVERTED LAB): Pro B Natriuretic peptide (BNP): 239 — ABNORMAL HIGH

## 2010-10-25 LAB — AMYLASE: Amylase: 67

## 2011-01-26 DIAGNOSIS — R5381 Other malaise: Secondary | ICD-10-CM | POA: Diagnosis not present

## 2011-01-26 DIAGNOSIS — R109 Unspecified abdominal pain: Secondary | ICD-10-CM | POA: Diagnosis not present

## 2011-01-26 DIAGNOSIS — R5383 Other fatigue: Secondary | ICD-10-CM | POA: Diagnosis not present

## 2011-02-01 ENCOUNTER — Other Ambulatory Visit: Payer: Self-pay | Admitting: Family Medicine

## 2011-02-01 DIAGNOSIS — H353290 Exudative age-related macular degeneration, unspecified eye, stage unspecified: Secondary | ICD-10-CM | POA: Diagnosis not present

## 2011-02-01 DIAGNOSIS — H35059 Retinal neovascularization, unspecified, unspecified eye: Secondary | ICD-10-CM | POA: Diagnosis not present

## 2011-02-01 DIAGNOSIS — R1013 Epigastric pain: Secondary | ICD-10-CM

## 2011-02-07 ENCOUNTER — Ambulatory Visit
Admission: RE | Admit: 2011-02-07 | Discharge: 2011-02-07 | Disposition: A | Discharge status: Home / Self Care | Payer: Medicare Other | Source: Ambulatory Visit | Attending: Family Medicine | Admitting: Family Medicine

## 2011-02-07 DIAGNOSIS — N200 Calculus of kidney: Secondary | ICD-10-CM | POA: Diagnosis not present

## 2011-02-07 DIAGNOSIS — R1013 Epigastric pain: Secondary | ICD-10-CM | POA: Diagnosis not present

## 2011-02-07 DIAGNOSIS — R634 Abnormal weight loss: Secondary | ICD-10-CM | POA: Diagnosis not present

## 2011-03-02 DIAGNOSIS — H3530 Unspecified macular degeneration: Secondary | ICD-10-CM | POA: Diagnosis not present

## 2011-03-08 DIAGNOSIS — H35059 Retinal neovascularization, unspecified, unspecified eye: Secondary | ICD-10-CM | POA: Diagnosis not present

## 2011-03-08 DIAGNOSIS — H353290 Exudative age-related macular degeneration, unspecified eye, stage unspecified: Secondary | ICD-10-CM | POA: Diagnosis not present

## 2011-04-19 DIAGNOSIS — H35059 Retinal neovascularization, unspecified, unspecified eye: Secondary | ICD-10-CM | POA: Diagnosis not present

## 2011-04-19 DIAGNOSIS — H353290 Exudative age-related macular degeneration, unspecified eye, stage unspecified: Secondary | ICD-10-CM | POA: Diagnosis not present

## 2011-05-01 DIAGNOSIS — E559 Vitamin D deficiency, unspecified: Secondary | ICD-10-CM | POA: Diagnosis not present

## 2011-05-01 DIAGNOSIS — I2510 Atherosclerotic heart disease of native coronary artery without angina pectoris: Secondary | ICD-10-CM | POA: Diagnosis not present

## 2011-05-01 DIAGNOSIS — E785 Hyperlipidemia, unspecified: Secondary | ICD-10-CM | POA: Diagnosis not present

## 2011-05-01 DIAGNOSIS — N390 Urinary tract infection, site not specified: Secondary | ICD-10-CM | POA: Diagnosis not present

## 2011-05-01 DIAGNOSIS — I10 Essential (primary) hypertension: Secondary | ICD-10-CM | POA: Diagnosis not present

## 2011-05-09 DIAGNOSIS — Z4502 Encounter for adjustment and management of automatic implantable cardiac defibrillator: Secondary | ICD-10-CM | POA: Diagnosis not present

## 2011-05-09 DIAGNOSIS — I472 Ventricular tachycardia: Secondary | ICD-10-CM | POA: Diagnosis not present

## 2011-05-09 DIAGNOSIS — N289 Disorder of kidney and ureter, unspecified: Secondary | ICD-10-CM | POA: Diagnosis not present

## 2011-05-31 DIAGNOSIS — H35359 Cystoid macular degeneration, unspecified eye: Secondary | ICD-10-CM | POA: Diagnosis not present

## 2011-05-31 DIAGNOSIS — H35059 Retinal neovascularization, unspecified, unspecified eye: Secondary | ICD-10-CM | POA: Diagnosis not present

## 2011-05-31 DIAGNOSIS — H353290 Exudative age-related macular degeneration, unspecified eye, stage unspecified: Secondary | ICD-10-CM | POA: Diagnosis not present

## 2011-07-07 DIAGNOSIS — R7989 Other specified abnormal findings of blood chemistry: Secondary | ICD-10-CM | POA: Diagnosis not present

## 2011-07-10 ENCOUNTER — Other Ambulatory Visit: Payer: Self-pay | Admitting: Family Medicine

## 2011-07-10 DIAGNOSIS — Z1231 Encounter for screening mammogram for malignant neoplasm of breast: Secondary | ICD-10-CM

## 2011-07-21 DIAGNOSIS — R109 Unspecified abdominal pain: Secondary | ICD-10-CM | POA: Diagnosis not present

## 2011-07-21 DIAGNOSIS — M545 Low back pain: Secondary | ICD-10-CM | POA: Diagnosis not present

## 2011-07-21 DIAGNOSIS — N200 Calculus of kidney: Secondary | ICD-10-CM | POA: Diagnosis not present

## 2011-07-24 ENCOUNTER — Ambulatory Visit
Admission: RE | Admit: 2011-07-24 | Discharge: 2011-07-24 | Disposition: A | Discharge status: Home / Self Care | Payer: Medicare Other | Source: Ambulatory Visit | Attending: Family Medicine | Admitting: Family Medicine

## 2011-07-24 DIAGNOSIS — I959 Hypotension, unspecified: Secondary | ICD-10-CM | POA: Diagnosis not present

## 2011-07-24 DIAGNOSIS — Z1231 Encounter for screening mammogram for malignant neoplasm of breast: Secondary | ICD-10-CM | POA: Diagnosis not present

## 2011-07-25 ENCOUNTER — Observation Stay (HOSPITAL_COMMUNITY)
Admission: EM | Admit: 2011-07-25 | Discharge: 2011-07-29 | Disposition: A | Discharge status: Home / Self Care | Payer: Medicare Other | Source: Ambulatory Visit | Attending: Cardiovascular Disease | Admitting: Cardiovascular Disease

## 2011-07-25 ENCOUNTER — Emergency Department (HOSPITAL_COMMUNITY): Payer: Medicare Other

## 2011-07-25 ENCOUNTER — Encounter (HOSPITAL_COMMUNITY): Payer: Self-pay | Admitting: Emergency Medicine

## 2011-07-25 DIAGNOSIS — R531 Weakness: Secondary | ICD-10-CM | POA: Diagnosis present

## 2011-07-25 DIAGNOSIS — E559 Vitamin D deficiency, unspecified: Secondary | ICD-10-CM | POA: Diagnosis not present

## 2011-07-25 DIAGNOSIS — N189 Chronic kidney disease, unspecified: Secondary | ICD-10-CM | POA: Diagnosis not present

## 2011-07-25 DIAGNOSIS — R5381 Other malaise: Secondary | ICD-10-CM | POA: Insufficient documentation

## 2011-07-25 DIAGNOSIS — R110 Nausea: Secondary | ICD-10-CM | POA: Diagnosis not present

## 2011-07-25 DIAGNOSIS — E7800 Pure hypercholesterolemia, unspecified: Secondary | ICD-10-CM | POA: Insufficient documentation

## 2011-07-25 DIAGNOSIS — R079 Chest pain, unspecified: Principal | ICD-10-CM | POA: Diagnosis present

## 2011-07-25 DIAGNOSIS — E039 Hypothyroidism, unspecified: Secondary | ICD-10-CM | POA: Diagnosis not present

## 2011-07-25 DIAGNOSIS — I10 Essential (primary) hypertension: Secondary | ICD-10-CM | POA: Diagnosis not present

## 2011-07-25 DIAGNOSIS — I951 Orthostatic hypotension: Secondary | ICD-10-CM | POA: Clinically undetermined

## 2011-07-25 DIAGNOSIS — I495 Sick sinus syndrome: Secondary | ICD-10-CM | POA: Diagnosis not present

## 2011-07-25 DIAGNOSIS — R0602 Shortness of breath: Secondary | ICD-10-CM | POA: Insufficient documentation

## 2011-07-25 DIAGNOSIS — E785 Hyperlipidemia, unspecified: Secondary | ICD-10-CM | POA: Diagnosis not present

## 2011-07-25 DIAGNOSIS — Z86718 Personal history of other venous thrombosis and embolism: Secondary | ICD-10-CM | POA: Insufficient documentation

## 2011-07-25 DIAGNOSIS — R001 Bradycardia, unspecified: Secondary | ICD-10-CM | POA: Diagnosis present

## 2011-07-25 DIAGNOSIS — I2510 Atherosclerotic heart disease of native coronary artery without angina pectoris: Secondary | ICD-10-CM | POA: Insufficient documentation

## 2011-07-25 DIAGNOSIS — R5383 Other fatigue: Secondary | ICD-10-CM | POA: Diagnosis not present

## 2011-07-25 DIAGNOSIS — I959 Hypotension, unspecified: Secondary | ICD-10-CM | POA: Diagnosis not present

## 2011-07-25 DIAGNOSIS — Z9861 Coronary angioplasty status: Secondary | ICD-10-CM | POA: Insufficient documentation

## 2011-07-25 DIAGNOSIS — E1169 Type 2 diabetes mellitus with other specified complication: Secondary | ICD-10-CM | POA: Diagnosis present

## 2011-07-25 DIAGNOSIS — N183 Chronic kidney disease, stage 3 unspecified: Secondary | ICD-10-CM | POA: Diagnosis present

## 2011-07-25 DIAGNOSIS — Z95810 Presence of automatic (implantable) cardiac defibrillator: Secondary | ICD-10-CM | POA: Diagnosis present

## 2011-07-25 DIAGNOSIS — I82409 Acute embolism and thrombosis of unspecified deep veins of unspecified lower extremity: Secondary | ICD-10-CM | POA: Diagnosis present

## 2011-07-25 DIAGNOSIS — I252 Old myocardial infarction: Secondary | ICD-10-CM | POA: Diagnosis not present

## 2011-07-25 DIAGNOSIS — R9431 Abnormal electrocardiogram [ECG] [EKG]: Secondary | ICD-10-CM | POA: Diagnosis not present

## 2011-07-25 DIAGNOSIS — R0789 Other chest pain: Secondary | ICD-10-CM | POA: Diagnosis not present

## 2011-07-25 DIAGNOSIS — I259 Chronic ischemic heart disease, unspecified: Secondary | ICD-10-CM | POA: Diagnosis present

## 2011-07-25 DIAGNOSIS — N200 Calculus of kidney: Secondary | ICD-10-CM | POA: Diagnosis present

## 2011-07-25 DIAGNOSIS — I499 Cardiac arrhythmia, unspecified: Secondary | ICD-10-CM | POA: Diagnosis not present

## 2011-07-25 HISTORY — DX: Arthropathy, unspecified: M12.9

## 2011-07-25 HISTORY — DX: Personal history of other diseases of the digestive system: Z87.19

## 2011-07-25 HISTORY — DX: Calculus of kidney: N20.0

## 2011-07-25 HISTORY — DX: Residual hemorrhoidal skin tags: K64.4

## 2011-07-25 HISTORY — DX: Pure hypercholesterolemia, unspecified: E78.00

## 2011-07-25 HISTORY — DX: Acute embolism and thrombosis of unspecified deep veins of unspecified lower extremity: I82.409

## 2011-07-25 HISTORY — DX: Orthostatic hypotension: I95.1

## 2011-07-25 HISTORY — DX: Bradycardia, unspecified: R00.1

## 2011-07-25 LAB — CARDIAC PANEL(CRET KIN+CKTOT+MB+TROPI)
CK, MB: 2.6 ng/mL (ref 0.3–4.0)
CK, MB: 2.1 ng/mL (ref 0.3–4.0)
Relative Index: INVALID (ref 0.0–2.5)
Relative Index: INVALID (ref 0.0–2.5)
Total CK: 65 U/L (ref 7–177)
Total CK: 53 U/L (ref 7–177)
Troponin I: <0.3 ng/mL (ref <0.30)
Troponin I: <0.3 ng/mL (ref <0.30)

## 2011-07-25 LAB — URINALYSIS, ROUTINE W REFLEX MICROSCOPIC
Bilirubin Urine: NEGATIVE
Glucose, UA: NEGATIVE mg/dL
Hgb urine dipstick: NEGATIVE
Ketones, ur: NEGATIVE mg/dL
Nitrite: NEGATIVE
Protein, ur: NEGATIVE mg/dL
Specific Gravity, Urine: 1.012 (ref 1.005–1.030)
Urobilinogen, UA: 0.2 mg/dL (ref 0.0–1.0)
pH: 5.5 (ref 5.0–8.0)

## 2011-07-25 LAB — COMPREHENSIVE METABOLIC PANEL
ALT: 21 U/L (ref 0–35)
AST: 19 U/L (ref 0–37)
Albumin: 3.6 g/dL (ref 3.5–5.2)
Alkaline Phosphatase: 61 U/L (ref 39–117)
BUN: 29 mg/dL — ABNORMAL HIGH (ref 6–23)
CO2: 24 mEq/L (ref 19–32)
Calcium: 9.8 mg/dL (ref 8.4–10.5)
Chloride: 103 mEq/L (ref 96–112)
Creatinine, Ser: 1.48 mg/dL — ABNORMAL HIGH (ref 0.50–1.10)
GFR calc Af Amer: 38 mL/min — ABNORMAL LOW (ref >90)
GFR calc non Af Amer: 33 mL/min — ABNORMAL LOW (ref >90)
Glucose, Bld: 157 mg/dL — ABNORMAL HIGH (ref 70–99)
Potassium: 4.6 mEq/L (ref 3.5–5.1)
Sodium: 137 mEq/L (ref 135–145)
Total Bilirubin: 0.5 mg/dL (ref 0.3–1.2)
Total Protein: 7.1 g/dL (ref 6.0–8.3)

## 2011-07-25 LAB — CBC
Platelets: 212 10*3/uL (ref 150–400)
RDW: 13.3 % (ref 11.5–15.5)
WBC: 6.2 10*3/uL (ref 4.0–10.5)

## 2011-07-25 LAB — TSH: TSH: 3.039 u[IU]/mL (ref 0.350–4.500)

## 2011-07-25 LAB — PRO B NATRIURETIC PEPTIDE: Pro B Natriuretic peptide (BNP): 418.3 pg/mL (ref 0–450)

## 2011-07-25 LAB — URINE MICROSCOPIC-ADD ON

## 2011-07-25 LAB — BASIC METABOLIC PANEL
Chloride: 99 mEq/L (ref 96–112)
GFR calc Af Amer: 36 mL/min — ABNORMAL LOW (ref >90)
Potassium: 4.1 mEq/L (ref 3.5–5.1)
Sodium: 135 mEq/L (ref 135–145)

## 2011-07-25 LAB — PROTIME-INR
INR: 1.01 (ref 0.00–1.49)
Prothrombin Time: 13.5 seconds (ref 11.6–15.2)

## 2011-07-25 LAB — POCT I-STAT TROPONIN I: Troponin i, poc: 0 ng/mL (ref 0.00–0.08)

## 2011-07-25 LAB — MAGNESIUM: Magnesium: 2 mg/dL (ref 1.5–2.5)

## 2011-07-25 MED ORDER — CLOPIDOGREL BISULFATE 75 MG PO TABS
75.0000 mg | ORAL_TABLET | Freq: Every day | ORAL | Status: DC
  Administered 2011-07-26 – 2011-07-29 (×4): 75 mg via ORAL
  Filled 2011-07-25 (×4): qty 1

## 2011-07-25 MED ORDER — LISINOPRIL 10 MG PO TABS
10.0000 mg | ORAL_TABLET | Freq: Every day | ORAL | Status: DC
  Filled 2011-07-25 (×2): qty 1

## 2011-07-25 MED ORDER — TORSEMIDE 20 MG PO TABS
20.0000 mg | ORAL_TABLET | Freq: Every day | ORAL | Status: DC
  Administered 2011-07-26 – 2011-07-27 (×2): 20 mg via ORAL
  Filled 2011-07-25 (×2): qty 1

## 2011-07-25 MED ORDER — ISOSORBIDE MONONITRATE 15 MG HALF TABLET
15.0000 mg | ORAL_TABLET | Freq: Every day | ORAL | Status: DC
  Administered 2011-07-25 – 2011-07-26 (×2): 15 mg via ORAL
  Filled 2011-07-25 (×3): qty 1

## 2011-07-25 MED ORDER — ASPIRIN EC 81 MG PO TBEC
81.0000 mg | DELAYED_RELEASE_TABLET | Freq: Every day | ORAL | Status: DC
  Administered 2011-07-25 – 2011-07-29 (×5): 81 mg via ORAL
  Filled 2011-07-25 (×6): qty 1

## 2011-07-25 MED ORDER — ACETAMINOPHEN 325 MG PO TABS
650.0000 mg | ORAL_TABLET | Freq: Four times a day (QID) | ORAL | Status: DC | PRN
  Administered 2011-07-26 – 2011-07-28 (×3): 650 mg via ORAL
  Filled 2011-07-25 (×2): qty 2
  Filled 2011-07-25: qty 1
  Filled 2011-07-25 (×2): qty 2

## 2011-07-25 MED ORDER — ATORVASTATIN CALCIUM 80 MG PO TABS
80.0000 mg | ORAL_TABLET | Freq: Every day | ORAL | Status: DC
  Administered 2011-07-25 – 2011-07-28 (×4): 80 mg via ORAL
  Filled 2011-07-25 (×5): qty 1

## 2011-07-25 MED ORDER — TRAMADOL HCL 50 MG PO TABS
50.0000 mg | ORAL_TABLET | Freq: Four times a day (QID) | ORAL | Status: DC | PRN
  Administered 2011-07-25 – 2011-07-27 (×2): 50 mg via ORAL
  Filled 2011-07-25 (×2): qty 1

## 2011-07-25 MED ORDER — ZOLPIDEM TARTRATE 5 MG PO TABS
5.0000 mg | ORAL_TABLET | Freq: Every evening | ORAL | Status: DC | PRN
  Administered 2011-07-28: 5 mg via ORAL
  Filled 2011-07-25: qty 1

## 2011-07-25 MED ORDER — PAROXETINE HCL 20 MG PO TABS
20.0000 mg | ORAL_TABLET | Freq: Every day | ORAL | Status: DC
  Administered 2011-07-26 – 2011-07-29 (×4): 20 mg via ORAL
  Filled 2011-07-25 (×4): qty 1

## 2011-07-25 MED ORDER — METOPROLOL TARTRATE 25 MG PO TABS
25.0000 mg | ORAL_TABLET | Freq: Two times a day (BID) | ORAL | Status: DC
  Administered 2011-07-25: 25 mg via ORAL
  Filled 2011-07-25 (×3): qty 1

## 2011-07-25 MED ORDER — DISOPYRAMIDE PHOSPHATE ER 100 MG PO CP12
200.0000 mg | ORAL_CAPSULE | Freq: Two times a day (BID) | ORAL | Status: DC
  Administered 2011-07-26 – 2011-07-29 (×7): 200 mg via ORAL
  Filled 2011-07-25 (×12): qty 2

## 2011-07-25 MED ORDER — VITAMIN D3 25 MCG (1000 UNIT) PO TABS
2000.0000 [IU] | ORAL_TABLET | Freq: Every day | ORAL | Status: DC
  Administered 2011-07-26 – 2011-07-29 (×4): 2000 [IU] via ORAL
  Filled 2011-07-25 (×5): qty 2

## 2011-07-25 MED ORDER — ENOXAPARIN SODIUM 80 MG/0.8ML ~~LOC~~ SOLN
80.0000 mg | SUBCUTANEOUS | Status: AC
  Filled 2011-07-25: qty 0.8

## 2011-07-25 MED ORDER — ENOXAPARIN SODIUM 80 MG/0.8ML ~~LOC~~ SOLN
80.0000 mg | SUBCUTANEOUS | Status: DC
  Administered 2011-07-25 – 2011-07-27 (×3): 80 mg via SUBCUTANEOUS
  Filled 2011-07-25 (×4): qty 0.8

## 2011-07-25 MED ORDER — DISOPYRAMIDE PHOSPHATE ER 100 MG PO CP12
100.0000 mg | ORAL_CAPSULE | Freq: Two times a day (BID) | ORAL | Status: DC
  Administered 2011-07-25: 100 mg via ORAL
  Filled 2011-07-25: qty 1

## 2011-07-25 MED ORDER — ALPRAZOLAM 0.25 MG PO TABS: 0.2500 mg | ORAL_TABLET | Freq: Three times a day (TID) | ORAL | Status: DC | PRN

## 2011-07-25 MED ORDER — VITAMIN D 1000 UNITS PO CAPS: 2000.0000 [IU] | ORAL_CAPSULE | Freq: Every day | ORAL | Status: DC

## 2011-07-25 MED ORDER — ONDANSETRON HCL 4 MG/2ML IJ SOLN: 4.0000 mg | Freq: Four times a day (QID) | INTRAMUSCULAR | Status: DC | PRN

## 2011-07-25 MED ORDER — ALUM & MAG HYDROXIDE-SIMETH 200-200-20 MG/5ML PO SUSP
15.0000 mL | ORAL | Status: DC | PRN
  Administered 2011-07-25 – 2011-07-27 (×3): 15 mL via ORAL
  Filled 2011-07-25 (×3): qty 30

## 2011-07-25 NOTE — ED Provider Notes (Signed)
Medical screening examination/treatment/procedure(s) were conducted as a shared visit with non-physician practitioner(s) and myself.  I personally evaluated the patient during the encounter   Julianne Rice, MD 07/25/11 [phone]

## 2011-07-25 NOTE — ED Notes (Signed)
Pt c/o left sided CP intermittent x 2 weeks with some exertional SOB and nausea; pt told to come here by [facility]

## 2011-07-25 NOTE — Progress Notes (Signed)
ANTICOAGULATION CONSULT NOTE - Initial Consult  Pharmacy Consult for lovenox Indication: chest pain/ACS  Allergies  Allergen Reactions  . Contrast Media (Iodinated Diagnostic Agents) Other (See Comments)    headache    Patient Measurements: Height: 5\' 4"  (162.6 cm) Weight: 172 lb (78.019 kg) IBW/kg (Calculated) : 54.7  Heparin Dosing Weight: 78 kg  Vital Signs: BP: 137/82 mmHg (07/09 1306) Pulse Rate: 56  (07/09 1306)  Labs:  Basename 07/25/11 1231 07/25/11 1138  HGB -- 13.1  HCT -- 38.9  PLT -- 212  APTT -- --  LABPROT -- 13.5  INR -- 1.01  HEPARINUNFRC -- --  CREATININE 1.48* 1.56*  CKTOTAL 65 --  CKMB 2.6 --  TROPONINI <0.30 --    Estimated Creatinine Clearance: 32.2 ml/min (by C-G formula based on Cr of 1.48).   Medical History: Past Medical History  Diagnosis Date  . ICD (implantable cardiac defibrillator) in place 63    pt states history of atrial fib/vent tachy-cardiac arrest 93 when icd placed. preop prescription for implantable device faxed to [facility]  . Hypertension     EKG done 1 week ago [facility]-requested  . GERD (gastroesophageal reflux disease)     omeprazole  . Dysrhythmia   . Arthritis   . Diverticulitis   . Myocardial infarction     history of cardiac arrest 93-stents, on plavix-d/c 5 days prior to surger    Medications:  Scheduled:    . isosorbide mononitrate  15 mg Oral Daily   Infusions:    Assessment: 78 yo female with ACS and chest pain will be put on lovenox therapy.  SCr 1.48. When using IBW to calculate CrCl, her CrCl is ~ 27  Goal of Therapy:  Anti-Xa level 0.6-1.2 units/ml 4hrs after LMWH dose given Monitor platelets by anticoagulation protocol: Yes   Plan:  1) Lovenox 80mg  sq q24h for now. 2) CBC q 72 hours 3) Monitor renal fxn. If gets better (> 59ml/min), can change frequency to q12h.  Padraic Marinos, Tsz-Yin 07/25/2011,1:27 PM

## 2011-07-25 NOTE — ED Notes (Signed)
[facility] paged [phone]. PA Lurena Joiner called earlier Re: pt and will see pt in ED.

## 2011-07-25 NOTE — ED Notes (Signed)
Staff at bedside to perform interrogation of ICD.

## 2011-07-25 NOTE — H&P (Signed)
Patient ID: [name] MRN: [id number], DOB/AGE: [birth year]   Admit date: 07/25/2011   Primary Physician: Redge Gainer, MD Primary Cardiologist: Dr Gwenlyn Found  HPI: 78 y/o with a history of VT, s/p remote ICD in 1993 with multiple replacements, the last one in 2009 by Dr [name]. She has been "weak" for 3 wks. She went to her primary MD (dr D.Laurance Flatten). They called our office who instructed them to send the pt to the ER. The pt has multiple complaints- dizzy, weak, low B/P, nausea, back pain, flank pain, andLt chest pain to her back. She has not taken NTG. She also says she has lost 20lbs in 24mos without dieting. InDr Moore's office her HR was 50. Her EOL HR is 50 as well but an EKG from Nov 2011 also shows her HR to be 50. She is admitted now for further evaluation.    Problem List: Past Medical History  Diagnosis Date  . ICD (implantable cardiac defibrillator) in place 78    pt states history of atrial fib/vent tachy-cardiac arrest 93 when icd placed. preop prescription for implantable device faxed to [facility]  . Hypertension     EKG done 1 week ago [facility]-requested  . GERD (gastroesophageal reflux disease)     omeprazole  . Dysrhythmia   . Arthritis   . Diverticulitis   . Myocardial infarction     history of cardiac arrest 93-stents, on plavix-d/c 5 days prior to surger    Past Surgical History  Procedure Date  . Icd placement 1993  . Hemorroidectomy 80's  . Colonoscopy 2011  . Hysteroscopy w/d&c 09/02/2010    Procedure: DILATATION AND CURETTAGE (D&C) /HYSTEROSCOPY;  Surgeon: Margarette Asal;  Location: [facility];  Service: Gynecology;  Laterality: N/A;  Dilation and Curettage with Hysteroscopy and Polypectomy     Allergies:  Allergies  Allergen Reactions  . Contrast Media (Iodinated Diagnostic Agents) Other (See Comments)    headache     Home Medications  (Not in a hospital admission) See [facility]   History reviewed. No pertinent family history.   History    Social History  . Marital Status: Widowed    Spouse Name: N/A    Number of Children: N/A  . Years of Education: N/A   Occupational History  . Not on file.   Social History Main Topics  . Smoking status: Never Smoker   . Smokeless tobacco: Not on file  . Alcohol Use: No  . Drug Use: No  . Sexually Active:    Other Topics Concern  . Not on file   Social History Narrative  . No narrative on file     Review of Systems: General: negative for chills, fever, night sweats or weight changes.  Cardiovascular: negative for chest pain, dyspnea on exertion, edema, orthopnea, palpitations, paroxysmal nocturnal dyspnea or shortness of breath Dermatological: negative for rash Respiratory: negative for cough or wheezing Urologic: negative for hematuria Abdominal: negative for nausea, vomiting, diarrhea, bright red blood per rectum, melena, or hematemesis Neurologic: negative for visual changes, syncope, or dizziness All other systems reviewed and are otherwise negative except as noted above.  Physical Exam: Blood pressure 137/82, pulse 56, resp. rate 14, height 5\' 4"  (1.626 m), weight 78.019 kg (172 lb), SpO2 100.00%.  General appearance: alert, cooperative and no distress Neck: no adenopathy, no carotid bruit, no JVD, supple, symmetrical, trachea midline and thyroid not enlarged, symmetric, no tenderness/mass/nodules Lungs: clear to auscultation bilaterally Heart: regular rate and rhythm Abdomen: soft, diffuse tenderness Extremities:  chronic venous changes Pulses: 2+ and symmetric Skin: cool and dry Neurologic: Grossly normal    Labs:   Results for orders placed during the hospital encounter of 07/25/11 (from the past 24 hour(s))  CBC     Status: Normal   Collection Time   07/25/11 11:38 AM      Component Value Range   WBC 6.2  4.0 - 10.5 K/uL   RBC 4.12  3.87 - 5.11 MIL/uL   Hemoglobin 13.1  12.0 - 15.0 g/dL   HCT 38.9  36.0 - 46.0 %   MCV 94.4  78.0 - 100.0 fL   MCH  31.8  26.0 - 34.0 pg   MCHC 33.7  30.0 - 36.0 g/dL   RDW 13.3  11.5 - 15.5 %   Platelets 212  150 - 400 K/uL  BASIC METABOLIC PANEL     Status: Abnormal   Collection Time   07/25/11 11:38 AM      Component Value Range   Sodium 135  135 - 145 mEq/L   Potassium 4.1  3.5 - 5.1 mEq/L   Chloride 99  96 - 112 mEq/L   CO2 23  19 - 32 mEq/L   Glucose, Bld 174 (*) 70 - 99 mg/dL   BUN 29 (*) 6 - 23 mg/dL   Creatinine, Ser 1.56 (*) 0.50 - 1.10 mg/dL   Calcium 9.7  8.4 - 10.5 mg/dL   GFR calc non Af Amer 31 (*) >90 mL/min   GFR calc Af Amer 36 (*) >90 mL/min  PRO B NATRIURETIC PEPTIDE     Status: Normal   Collection Time   07/25/11 11:38 AM      Component Value Range   Pro B Natriuretic peptide (BNP) 418.3  0 - 450 pg/mL  PROTIME-INR     Status: Normal   Collection Time   07/25/11 11:38 AM      Component Value Range   Prothrombin Time 13.5  11.6 - 15.2 seconds   INR 1.01  0.00 - 1.49  POCT I-STAT TROPONIN I     Status: Normal   Collection Time   07/25/11 12:03 PM      Component Value Range   Troponin i, poc 0.00  0.00 - 0.08 ng/mL   Comment 3              Radiology/Studies: Dg Chest 2 View  07/25/2011  *RADIOLOGY REPORT*  Clinical Data: Chest pain and "fluttering".  Hypertension.  Cardiac arrhythmia.  CHEST - 2 VIEW  Comparison: 12/06/2009  Findings: Lower thoracic and marked upper lumbar spondylosis.  Pacers and AICD devices terminate over the right ventricle.  No definite lead discontinuity identified.  There is one extra lead identified, not attached to a battery.  Midline trachea.  Borderline cardiomegaly.     Mediastinal contours otherwise within normal limits.  No pleural effusion or pneumothorax.  No congestive failure.  Clear lungs.  Prominent gas-filled bowel loops in the upper abdomen.  IMPRESSION: No acute cardiopulmonary disease.  Original Report Authenticated By: Areta Haber, M.D.   Mm Digital Screening  07/24/2011  *RADIOLOGY REPORT*  Clinical Data: Screening.  DIGITAL SCREENING  MAMMOGRAM WITH CAD  Comparison:  Previous exams  Findings:  There are scattered fibroglandular densities. No suspicious masses, architectural distortion, or calcifications are present.  Images were processed with CAD.  IMPRESSION: No specific mammographic evidence of malignancy.  A result letter of this screening mammogram will be mailed directly to the patient.  RECOMMENDATION: Screening mammogram  in one year. (Code:SM-B-01Y)  BI-RADS CATEGORY 1:  Negative  Original Report Authenticated By: Altamese Cabal, M.D.    EKG:NSR-SB-lateral Qs- same as Nov 2011  ASSESSMENT AND PLAN:  Principal Problem:  *Chest pain, r/o cardiac Active Problems:  Fatigue X 3wks  CAD, s/p LAD and Dx stenting in 2003- patent 2006, Myoview low risk 2010. NL LVF 2D 10/12  Bradycardia, this is not new  ICD (implantable cardiac defibrillator) in place, VT in 1993, multiple device changes, last in 2009  Chronic renal insufficiency, stage III (moderate), Scr 1.5 in the past  HYPERCHOLESTEROLEMIA  History of DVT in the past, not on Coumadin now  Nephrolithiasis, just saw Dr [name]  Plan- Admit, check ICD for EOL, r/o MI. Will add low dose nitrate, decrease beta blocker, start Lovenox SQ for now.  Henri Medal, PA-C 07/25/2011, 1:19 PM  Agree with note written by Kerin Ransom PAC  Pt well known to me. H/O CAD, ICD admitted with multiple somatic complaints including weakness, pooor appetite, occasional dizziness and chest pressure. Last myoview was in 2010 and was low risk. Dr. Jacklynn Bue follows ICD in the office. She has chronic SB. ICD interrogated today and was OK. Exam benign. Agree with checking labs, adjusting meds. Prob home tomorrow with OP myoview.  Lorretta Harp 07/25/2011 4:20 PM

## 2011-07-25 NOTE — ED Notes (Signed)
Pt taken to xray 

## 2011-07-25 NOTE — ED Provider Notes (Addendum)
History     CSN: [id number]  Arrival date & time 07/25/11  1100   First MD Initiated Contact with Patient 07/25/11 1143      Chief Complaint  Patient presents with  . Chest Pain    (Consider location/radiation/quality/duration/timing/severity/associated sxs/prior treatment) The history is provided by the patient.  78 year old female who presents with intermittent left-sided chest pressure which has been present for the past 2 weeks. Pressure worsens with exertion and improves with rest. Occasionally radiates to L shoulder. She has had some associated shortness of breath with exertion as well. Additionally endorses nausea without vomiting. Her blood pressure has been running low with systolics in the 0000000 when she's checked it at home. Denies any leg swelling, new PND/orthopnea, palpitations, abd pain. Patient does have a hx of CAD and has an ICD in place - she is followed by [facility], Drs. Rollene Fare and Gwenlyn Found. Patient states that she went to her PCP, Dr. Laurance Flatten, at [facility] today. An EKG was performed and faxed to [facility]. She was instructed to present to the emergency department for further assessment. The [facility] inpatient team is aware of the patient's presence in the department.  Past Medical History  Diagnosis Date  . ICD (implantable cardiac defibrillator) in place 42    pt states history of atrial fib/vent tachy-cardiac arrest 93 when icd placed. preop prescription for implantable device faxed to [facility]  . Hypertension     EKG done 1 week ago [facility]-requested  . GERD (gastroesophageal reflux disease)     omeprazole  . Dysrhythmia   . Arthritis   . Diverticulitis   . Myocardial infarction     history of cardiac arrest 93-stents, on plavix-d/c 5 days prior to surger    Past Surgical History  Procedure Date  . Icd placement 1993  . Hemorroidectomy 80's  . Colonoscopy 2011  . Hysteroscopy w/d&c 09/02/2010   Procedure: DILATATION AND CURETTAGE (D&C) /HYSTEROSCOPY;  Surgeon: Margarette Asal;  Location: [facility];  Service: Gynecology;  Laterality: N/A;  Dilation and Curettage with Hysteroscopy and Polypectomy    History reviewed. No pertinent family history.  History  Substance Use Topics  . Smoking status: Never Smoker   . Smokeless tobacco: Not on file  . Alcohol Use: No    OB History    Grav Para Term Preterm Abortions TAB SAB Ect Mult Living                  Review of Systems  Constitutional: Negative for fever, chills, activity change and appetite change.  Respiratory: Positive for shortness of breath. Negative for cough and chest tightness.   Cardiovascular: Positive for chest pain. Negative for palpitations and leg swelling.  Gastrointestinal: Negative for nausea, vomiting and abdominal pain.  Musculoskeletal: Negative for myalgias.  Skin: Negative for color change and rash.  Neurological: Negative for dizziness and weakness.  All other systems reviewed and are negative.    Allergies  Contrast media  Home Medications   Current Outpatient Rx  Name Route Sig Dispense Refill  . ASPIRIN EC 81 MG PO TBEC Oral Take 81 mg by mouth daily.    Marland Kitchen CITRACAL + D PO Oral Take 1 tablet by mouth daily.    Marland Kitchen VITAMIN D 1000 UNITS PO CAPS Oral Take 2,000 Units by mouth daily.     Marland Kitchen CLOPIDOGREL BISULFATE 75 MG PO TABS Oral Take 75 mg by mouth daily.      Marland Kitchen DISOPYRAMIDE PHOSPHATE ER  100 MG PO CP12 Oral Take 100 mg by mouth 2 (two) times daily.      Marland Kitchen LISINOPRIL 10 MG PO TABS Oral Take 10 mg by mouth daily.      Marland Kitchen METOPROLOL TARTRATE 50 MG PO TABS Oral Take 50 mg by mouth 2 (two) times daily.     Marland Kitchen PAROXETINE HCL 20 MG PO TABS Oral Take 20 mg by mouth every morning.      Marland Kitchen ROSUVASTATIN CALCIUM 40 MG PO TABS Oral Take 20 mg by mouth daily.      . TORSEMIDE 20 MG PO TABS Oral Take 20 mg by mouth daily as needed. For fluid retention      BP 161/106  Pulse 56  Resp 22  SpO2 96%  Physical  Exam  Nursing note and vitals reviewed. Constitutional: She is oriented to person, place, and time. She appears well-developed and well-nourished. No distress.  HENT:  Head: Normocephalic and atraumatic.  Eyes:       Normal appearance  Neck: Normal range of motion.  Cardiovascular: Normal rate, regular rhythm and normal heart sounds.  Exam reveals no gallop and no friction rub.   No murmur heard. Pulmonary/Chest: Effort normal and breath sounds normal. She has no wheezes. She exhibits no tenderness.  Abdominal: Soft. Bowel sounds are normal. There is no tenderness. There is no rebound and no guarding.  Musculoskeletal: Normal range of motion.  Neurological: She is alert and oriented to person, place, and time.  Skin: Skin is warm and dry. She is not diaphoretic.  Psychiatric: She has a normal mood and affect.    ED Course  Procedures (including critical care time)  Labs Reviewed  BASIC METABOLIC PANEL - Abnormal; Notable for the following:    Glucose, Bld 174 (*)     BUN 29 (*)     Creatinine, Ser 1.56 (*)     GFR calc non Af Amer 31 (*)     GFR calc Af Amer 36 (*)     All other components within normal limits  CBC  PRO B NATRIURETIC PEPTIDE  PROTIME-INR  POCT I-STAT TROPONIN I  COMPREHENSIVE METABOLIC PANEL  TSH  MAGNESIUM  CARDIAC PANEL(CRET KIN+CKTOT+MB+TROPI)  URINALYSIS, ROUTINE W REFLEX MICROSCOPIC   Dg Chest 2 View  07/25/2011  *RADIOLOGY REPORT*  Clinical Data: Chest pain and "fluttering".  Hypertension.  Cardiac arrhythmia.  CHEST - 2 VIEW  Comparison: 12/06/2009  Findings: Lower thoracic and marked upper lumbar spondylosis.  Pacers and AICD devices terminate over the right ventricle.  No definite lead discontinuity identified.  There is one extra lead identified, not attached to a battery.  Midline trachea.  Borderline cardiomegaly.     Mediastinal contours otherwise within normal limits.  No pleural effusion or pneumothorax.  No congestive failure.  Clear lungs.   Prominent gas-filled bowel loops in the upper abdomen.  IMPRESSION: No acute cardiopulmonary disease.  Original Report Authenticated By: Areta Haber, M.D.   Mm Digital Screening  07/24/2011  *RADIOLOGY REPORT*  Clinical Data: Screening.  DIGITAL SCREENING MAMMOGRAM WITH CAD  Comparison:  Previous exams  Findings:  There are scattered fibroglandular densities. No suspicious masses, architectural distortion, or calcifications are present.  Images were processed with CAD.  IMPRESSION: No specific mammographic evidence of malignancy.  A result letter of this screening mammogram will be mailed directly to the patient.  RECOMMENDATION: Screening mammogram in one year. (Code:SM-B-01Y)  BI-RADS CATEGORY 1:  Negative  Original Report Authenticated By: Altamese Cabal, M.D.  No diagnosis found.    MDM  [place] team at bedside. Pt to be admitted.  Abran Richard, PA-C 07/25/11 1312  Addendum (ECG interpretation)  Date: 07/25/11  Rate: 57  Rhythm: sinus bradycardia  QRS Axis: left  Intervals: PR prolonged  ST/T Wave abnormalities: nonspecific ST changes  Conduction Disutrbances:first-degree A-V block   Narrative Interpretation: LVH,  Old EKG Reviewed: none available  Abran Richard, PA-C 08/17/11 1359

## 2011-07-26 DIAGNOSIS — I2510 Atherosclerotic heart disease of native coronary artery without angina pectoris: Secondary | ICD-10-CM | POA: Diagnosis not present

## 2011-07-26 DIAGNOSIS — R079 Chest pain, unspecified: Secondary | ICD-10-CM | POA: Diagnosis not present

## 2011-07-26 LAB — BASIC METABOLIC PANEL
BUN: 25 mg/dL — ABNORMAL HIGH (ref 6–23)
CO2: 25 mEq/L (ref 19–32)
Calcium: 9.2 mg/dL (ref 8.4–10.5)
Chloride: 105 mEq/L (ref 96–112)
Creatinine, Ser: 1.34 mg/dL — ABNORMAL HIGH (ref 0.50–1.10)
GFR calc Af Amer: 43 mL/min — ABNORMAL LOW (ref >90)
GFR calc non Af Amer: 37 mL/min — ABNORMAL LOW (ref >90)
Glucose, Bld: 90 mg/dL (ref 70–99)
Potassium: 4.4 mEq/L (ref 3.5–5.1)
Sodium: 139 mEq/L (ref 135–145)

## 2011-07-26 LAB — CBC
Hemoglobin: 11.4 g/dL — ABNORMAL LOW (ref 12.0–15.0)
MCH: 30.7 pg (ref 26.0–34.0)
Platelets: 189 10*3/uL (ref 150–400)
RBC: 3.71 MIL/uL — ABNORMAL LOW (ref 3.87–5.11)
WBC: 6.3 10*3/uL (ref 4.0–10.5)

## 2011-07-26 LAB — CARDIAC PANEL(CRET KIN+CKTOT+MB+TROPI)
CK, MB: 2 ng/mL (ref 0.3–4.0)
Relative Index: INVALID (ref 0.0–2.5)
Total CK: 45 U/L (ref 7–177)
Troponin I: <0.3 ng/mL (ref <0.30)

## 2011-07-26 MED ORDER — DISOPYRAMIDE PHOSPHATE ER 100 MG PO CP12
100.0000 mg | ORAL_CAPSULE | Freq: Once | ORAL | Status: AC
  Administered 2011-07-26: 100 mg via ORAL
  Filled 2011-07-26: qty 1

## 2011-07-26 MED ORDER — ISOSORBIDE MONONITRATE 15 MG HALF TABLET: 15.0000 mg | ORAL_TABLET | Freq: Every day | ORAL | Status: DC

## 2011-07-26 MED ORDER — METOPROLOL TARTRATE 12.5 MG HALF TABLET
12.5000 mg | ORAL_TABLET | Freq: Two times a day (BID) | ORAL | Status: DC
  Administered 2011-07-26: 12.5 mg via ORAL
  Filled 2011-07-26 (×4): qty 1

## 2011-07-26 MED ORDER — ISOSORBIDE MONONITRATE 15 MG HALF TABLET
15.0000 mg | ORAL_TABLET | Freq: Every day | ORAL | Status: DC
  Administered 2011-07-27 – 2011-07-29 (×3): 15 mg via ORAL
  Filled 2011-07-26 (×3): qty 1

## 2011-07-26 MED ORDER — METOPROLOL TARTRATE 25 MG PO TABS: 12.5000 mg | ORAL_TABLET | Freq: Two times a day (BID) | ORAL | Status: DC

## 2011-07-26 MED ORDER — DISOPYRAMIDE PHOSPHATE ER 100 MG PO CP12: 200.0000 mg | ORAL_CAPSULE | Freq: Two times a day (BID) | ORAL | Status: DC

## 2011-07-26 NOTE — Progress Notes (Signed)
Subjective:  No specific complaints.  Objective:  Vital Signs in the last 24 hours: Temp:  [98 F (36.7 C)-98.4 F (36.9 C)] 98.1 F (36.7 C) (07/10 0500) Pulse Rate:  [51-61] 51  (07/10 0500) Resp:  [12-22] 16  (07/10 0500) BP: (108-164)/(59-106) 108/59 mmHg (07/10 0500) SpO2:  [96 %-100 %] 98 % (07/10 0500) Weight:  [78.019 kg (172 lb)-79.425 kg (175 lb 1.6 oz)] 79.425 kg (175 lb 1.6 oz) (07/10 0500)  Intake/Output from previous day:  Intake/Output Summary (Last 24 hours) at 07/26/11 0842 Last data filed at 07/25/11 2300  Gross per 24 hour  Intake      0 ml  Output    500 ml  Net   -500 ml    Physical Exam: General appearance: alert, cooperative and no distress Lungs: clear to auscultation bilaterally Heart: regular rate and rhythm   Rate: 50  Rhythm: sinus bradycardia and pacing on demand  Lab Results:  Basename 07/26/11 0505 07/25/11 1138  WBC 6.3 6.2  HGB 11.4* 13.1  PLT 189 212    Basename 07/26/11 0505 07/25/11 1231  NA 139 137  K 4.4 4.6  CL 105 103  CO2 25 24  GLUCOSE 90 157*  BUN 25* 29*  CREATININE 1.34* 1.48*    Basename 07/26/11 0600 07/25/11 2027  TROPONINI <0.30 <0.30   Hepatic Function Panel  Basename 07/25/11 1231  PROT 7.1  ALBUMIN 3.6  AST 19  ALT 21  ALKPHOS 61  BILITOT 0.5  BILIDIR --  IBILI --   No results found for this basename: CHOL in the last 72 hours  Basename 07/25/11 1138  INR 1.01    Imaging: Imaging results have been reviewed  Cardiac Studies:  Assessment/Plan:   Principal Problem:  *Chest pain, r/o cardiac Active Problems:  Fatigue X 3wks  CAD, s/p LAD and Dx stenting in 2003- patent 2006, Myoview low risk 2010. NL LVF 2D 10/12  Bradycardia, this is not new  ICD (implantable cardiac defibrillator) in place, VT in 1993, multiple device changes, last in 2009  Chronic renal insufficiency, stage III (moderate), Scr 1.5 in the past  HYPERCHOLESTEROLEMIA  History of DVT in the past, not on Coumadin  now  Nephrolithiasis, just saw Dr [name]  Plan-Decrease beta blocker. I wonder if bradycardia is causing some fatigue. She says she had AF a year ago and Norpace CR was increased to 200mg  BID, consider decreasing to 100mg  BID. She can probably go home with an OP Myoview later today.    Kerin Ransom PA-C 07/26/2011, 8:42 AM

## 2011-07-26 NOTE — Progress Notes (Signed)
Pt. Seen and examined. Agree with the NP/PA-C note as written.  HR remains low. Fatigue is somewhat better. No specific device findings. This can be worked up as an outpatient, +/- NST in the office. Consider MET test. Ok for d/c today.  Pixie Casino, MD, [facility] Attending Cardiologist [facility]

## 2011-07-26 NOTE — Progress Notes (Signed)
Was preparing pt for d/c and she complained of dizziness and weakness, BP was down to 95 systolic.  With orthostatics her BP decreased upon standing.  Her metoprolol and her ACE were held this am and still with hypotension.    I have d/c'd ACE, and written parameters on imdur and diuretic.   Pt. And Dr. Eloise Levels note stated she has been on Norpace 200mg   BID not 100 mg BID. ? Could Norpace be playing a role in wt. Loss?  ? anorexia    Will keep another day and adjust medications.

## 2011-07-27 ENCOUNTER — Encounter (HOSPITAL_COMMUNITY): Payer: Self-pay | Admitting: Cardiology

## 2011-07-27 DIAGNOSIS — I951 Orthostatic hypotension: Secondary | ICD-10-CM | POA: Clinically undetermined

## 2011-07-27 LAB — BASIC METABOLIC PANEL
BUN: 27 mg/dL — ABNORMAL HIGH (ref 6–23)
CO2: 27 mEq/L (ref 19–32)
Chloride: 102 mEq/L (ref 96–112)
Creatinine, Ser: 1.57 mg/dL — ABNORMAL HIGH (ref 0.50–1.10)
GFR calc Af Amer: 36 mL/min — ABNORMAL LOW (ref >90)
Glucose, Bld: 83 mg/dL (ref 70–99)
Potassium: 4.2 mEq/L (ref 3.5–5.1)

## 2011-07-27 MED ORDER — SODIUM CHLORIDE 0.9 % IV BOLUS (SEPSIS)
500.0000 mL | Freq: Once | INTRAVENOUS | Status: AC
  Administered 2011-07-27: 250 mL via INTRAVENOUS

## 2011-07-27 MED ORDER — TORSEMIDE 10 MG PO TABS: 10.0000 mg | ORAL_TABLET | Freq: Every day | ORAL | Status: DC

## 2011-07-27 NOTE — Care Management Note (Addendum)
    Page 1 of 1   07/28/2011     10:15:20 AM   CARE MANAGEMENT NOTE 07/27/2011  Patient:  [name], [name]   Account Number:  [id number]  Date Initiated:  07/27/2011  Documentation initiated by:  GRAVES-BIGELOW,Summer Mccolgan  Subjective/Objective Assessment:   Pt admitted with CP. Bp Orthostatic and per MD notes plan to addjust meds.     Action/Plan:   CM spoke to pt and she has family support. States her daughter lives down the road and she will be able to stay with her at d/c. Pt refused [place] RN at this time. No further needs from CM at this time.   Anticipated DC Date:  07/27/2011   Anticipated DC Plan:  [place]  CM consult      Choice offered to / List presented to:             Status of service:  Completed, signed off Medicare Important Message given?   (If response is "NO", the following Medicare IM given date fields will be blank) Date Medicare IM given:   Date Additional Medicare IM given:    Discharge Disposition:  HOME/SELF CARE  Per UR Regulation:  Reviewed for med. necessity/level of care/duration of stay  If discussed at [place] of Stay Meetings, dates discussed:    Comments:

## 2011-07-27 NOTE — Progress Notes (Signed)
CARDIAC REHAB PHASE I   PRE:  Rate/Rhythm: 60 SR  BP:  Supine:   Sitting: 121/68  Standing: 98/54   SaO2: 94 RA  MODE:  Ambulation: 920 ft   POST:  Rate/Rhythem: 64 SR  BP:  Supine:   Sitting: 124/64  Standing:    SaO2: 97 RA [id number] On arrival pt up in room walking without c/o. Had pt to sit in chair to take BP, then had her to stand to take BP. On standing states that she felt a little dizzy. Assisted X 1 with hand held assist to ambulate. Gait steady with walking no c/o. Pt back to chair after walk with call light in reach. She denies any dizziness with walking, but stated that she felt a little dizzy on return from walk right before sitting in chair.  Did discuss with pt safety precautions when at home with always sitting a while before standing and to use caution in bathroom with bending over and hot showers.  Deon Pilling

## 2011-07-27 NOTE — Progress Notes (Signed)
UR Completed Ashley Bultema Graves-Bigelow, RN,BSN [phone]  

## 2011-07-27 NOTE — Progress Notes (Addendum)
Subjective: Continues to be orthostatic with dizziness, stopped ACE, It was held yesterday and AM of dose of BB held.  HR 50's to 60's  Objective: Vital signs in last 24 hours: Temp:  [97.8 F (36.6 C)-98.2 F (36.8 C)] 98.1 F (36.7 C) (07/11 0500) Pulse Rate:  [54-67] 56  (07/11 1040) Resp:  [16-18] 18  (07/11 0500) BP: (83-128)/(54-75) 107/64 mmHg (07/11 1040) SpO2:  [94 %-99 %] 99 % (07/11 0500) Weight:  [78.019 kg (172 lb)] 78.019 kg (172 lb) (07/11 0500) Weight change: 0 kg (0 lb) Last BM Date: 07/27/11 Intake/Output from previous day: +120 07/10 0701 - 07/11 0700 In: 120 [P.O.:120] Out: -  Intake/Output this shift:    PE: General:alert and oriented Heart:S1S2 RRR Lungs:clear Abd:+ BS Ext:no edema    Lab Results:  Basename 07/26/11 0505 07/25/11 1138  WBC 6.3 6.2  HGB 11.4* 13.1  HCT 34.7* 38.9  PLT 189 212   BMET  Basename 07/27/11 0550 07/26/11 0505  NA 137 139  K 4.2 4.4  CL 102 105  CO2 27 25  GLUCOSE 83 90  BUN 27* 25*  CREATININE 1.57* 1.34*  CALCIUM 9.3 9.2    Basename 07/26/11 0600 07/25/11 2027  TROPONINI <0.30 <0.30    Lab Results  Component Value Date   CHOL 142 09/02/2010   HDL 50 09/02/2010   LDLCALC 58 09/02/2010   TRIG 169* 09/02/2010   CHOLHDL 2.8 09/02/2010   Lab Results  Component Value Date   HGBA1C  Value: 6.1 (NOTE)                                                                       According to the ADA Clinical Practice Recommendations for 2011, when HbA1c is used as a screening test:   >=6.5%   Diagnostic of Diabetes Mellitus           (if abnormal result  is confirmed)  5.7-6.4%   Increased risk of developing Diabetes Mellitus  References:Diagnosis and Classification of Diabetes Mellitus,Diabetes [id number] 1):S62-S69 and Standards of Medical Care in         Diabetes - 2011,Diabetes [id number]  (Suppl 1):S11-S61.* 12/06/2009     Lab Results  Component Value Date   TSH 3.039 07/25/2011    Hepatic Function  Panel  Basename 07/25/11 1231  PROT 7.1  ALBUMIN 3.6  AST 19  ALT 21  ALKPHOS 61  BILITOT 0.5  BILIDIR --  IBILI --   No results found for this basename: CHOL in the last 72 hours No results found for this basename: PROTIME in the last 72 hours    EKG: Orders placed during the hospital encounter of 07/25/11  . EKG 12-LEAD  . EKG 12-LEAD  . ED EKG  . ED EKG    Studies/Results: Dg Chest 2 View  07/25/2011  *RADIOLOGY REPORT*  Clinical Data: Chest pain and "fluttering".  Hypertension.  Cardiac arrhythmia.  CHEST - 2 VIEW  Comparison: 12/06/2009  Findings: Lower thoracic and marked upper lumbar spondylosis.  Pacers and AICD devices terminate over the right ventricle.  No definite lead discontinuity identified.  There is one extra lead identified, not attached to a battery.  Midline trachea.  Borderline cardiomegaly.     Mediastinal  contours otherwise within normal limits.  No pleural effusion or pneumothorax.  No congestive failure.  Clear lungs.  Prominent gas-filled bowel loops in the upper abdomen.  IMPRESSION: No acute cardiopulmonary disease.  Original Report Authenticated By: Areta Haber, M.D.    Medications: I have reviewed the patient's current medications.    Marland Kitchen aspirin EC  81 mg Oral Daily  . atorvastatin  80 mg Oral q1800  . cholecalciferol  2,000 Units Oral Daily  . clopidogrel  75 mg Oral Daily  . disopyramide  200 mg Oral Q12H  . enoxaparin (LOVENOX) injection  80 mg Subcutaneous Q24H  . isosorbide mononitrate  15 mg Oral Daily  . metoprolol  12.5 mg Oral BID  . PARoxetine  20 mg Oral Daily  . torsemide  20 mg Oral Daily  . DISCONTD: isosorbide mononitrate  15 mg Oral Daily  . DISCONTD: lisinopril  10 mg Oral Daily   Assessment/Plan: Patient Active Problem List  Diagnosis  . HYPERCHOLESTEROLEMIA  . CAD, s/p LAD and Dx stenting in 2003- patent 2006, Myoview low risk 2010. NL LVF 2D 10/12  . EXTERNAL HEMORRHOIDS  . ESOPHAGITIS  . GASTRITIS  . COLITIS  .  DIVERTICULOSIS, COLON  . RECTAL BLEEDING  . RENAL CALCULUS  . ARTHRITIS  . DIARRHEA  . EPIGASTRIC PAIN  . DYSPHAGIA  . Chest pain, r/o cardiac, negative MI  . Fatigue X 3wks  . Bradycardia, this is not new  . ICD (implantable cardiac defibrillator) in place, VT in 1993, multiple device changes, last in 2009  . Chronic renal insufficiency, stage III (moderate), Scr 1.5 in the past  . History of DVT in the past, not on Coumadin now  . Nephrolithiasis, just saw Dr [name]  . Orthostatic hypotension   PLAN:  ? Anorexia due to Norpace, though this was only med that controlled VTach. ? Continue torsemide or decrease dose?    May want to keep on day more to monitor BP.     LOS: 2 days   INGOLD,LAURA R 07/27/2011, 11:42 AM   I have seen & examined the patient today.  I agree with the findings, examination and chart review by Cecilie Kicks, NP.  As she continues to be orhtostatic with increasing Cr on labs, will give NS bolus, and decrease dose of diuretic. Reluctant to adjust Norpace dose as her VT seems stable, however, would consider decreasing if no other etiology for anorexia is found.  - IBS is another possiblity.  Need to see her more stable & less orthostatic prior to d/c -- consider tomorrow.   Need to clarify d/c meds. Torsemide was essentially a prn med - will hold here. ACE-I held & BB currently being held now.  May d/c off of both.    Leonie Man, M.D., M.S. [facility] [address] [place], [place]  [address]  [phone] Pager # [phone]  07/27/2011 2:48 PM

## 2011-07-28 ENCOUNTER — Encounter (HOSPITAL_COMMUNITY): Payer: Self-pay | Admitting: Cardiology

## 2011-07-28 ENCOUNTER — Encounter (HOSPITAL_COMMUNITY)
Admission: EM | Disposition: A | Discharge status: Home / Self Care | Payer: Self-pay | Source: Ambulatory Visit | Attending: Emergency Medicine

## 2011-07-28 DIAGNOSIS — I2510 Atherosclerotic heart disease of native coronary artery without angina pectoris: Secondary | ICD-10-CM | POA: Diagnosis not present

## 2011-07-28 DIAGNOSIS — R079 Chest pain, unspecified: Secondary | ICD-10-CM | POA: Diagnosis not present

## 2011-07-28 HISTORY — PX: LEFT HEART CATHETERIZATION WITH CORONARY ANGIOGRAM: SHX5451

## 2011-07-28 LAB — BASIC METABOLIC PANEL
BUN: 22 mg/dL (ref 6–23)
CO2: 25 mEq/L (ref 19–32)
Chloride: 100 mEq/L (ref 96–112)
Creatinine, Ser: 1.37 mg/dL — ABNORMAL HIGH (ref 0.50–1.10)
GFR calc Af Amer: 42 mL/min — ABNORMAL LOW (ref >90)
Potassium: 3.5 mEq/L (ref 3.5–5.1)

## 2011-07-28 SURGERY — LEFT HEART CATHETERIZATION WITH CORONARY ANGIOGRAM
Anesthesia: LOCAL

## 2011-07-28 MED ORDER — ONDANSETRON HCL 4 MG/2ML IJ SOLN: 4.0000 mg | Freq: Four times a day (QID) | INTRAMUSCULAR | Status: DC | PRN

## 2011-07-28 MED ORDER — DIPHENHYDRAMINE HCL 50 MG/ML IJ SOLN
25.0000 mg | INTRAMUSCULAR | Status: AC
  Administered 2011-07-28: 25 mg via INTRAVENOUS
  Filled 2011-07-28: qty 1

## 2011-07-28 MED ORDER — FAMOTIDINE IN NACL 20-0.9 MG/50ML-% IV SOLN
20.0000 mg | INTRAVENOUS | Status: AC
  Administered 2011-07-28: 20 mg via INTRAVENOUS
  Filled 2011-07-28 (×2): qty 50

## 2011-07-28 MED ORDER — METHYLPREDNISOLONE SODIUM SUCC 125 MG IJ SOLR
125.0000 mg | INTRAMUSCULAR | Status: AC
  Administered 2011-07-28: 125 mg via INTRAVENOUS
  Filled 2011-07-28: qty 2

## 2011-07-28 MED ORDER — NITROGLYCERIN 0.2 MG/ML ON CALL CATH LAB
INTRAVENOUS | Status: AC
  Filled 2011-07-28: qty 1

## 2011-07-28 MED ORDER — SODIUM CHLORIDE 0.9 % IJ SOLN
3.0000 mL | Freq: Two times a day (BID) | INTRAMUSCULAR | Status: DC
Start: 2011-07-28 — End: 2011-07-28
  Administered 2011-07-28: 3 mL via INTRAVENOUS

## 2011-07-28 MED ORDER — MORPHINE SULFATE 4 MG/ML IJ SOLN: 1.0000 mg | INTRAMUSCULAR | Status: DC | PRN

## 2011-07-28 MED ORDER — DIAZEPAM 2 MG PO TABS
2.0000 mg | ORAL_TABLET | ORAL | Status: AC
  Administered 2011-07-28: 2 mg via ORAL
  Filled 2011-07-28: qty 1

## 2011-07-28 MED ORDER — ASPIRIN 81 MG PO CHEW
243.0000 mg | CHEWABLE_TABLET | Freq: Once | ORAL | Status: AC
  Administered 2011-07-28: 243 mg via ORAL
  Filled 2011-07-28: qty 3

## 2011-07-28 MED ORDER — MIDAZOLAM HCL 2 MG/2ML IJ SOLN
INTRAMUSCULAR | Status: AC
  Filled 2011-07-28: qty 2

## 2011-07-28 MED ORDER — FENTANYL CITRATE 0.05 MG/ML IJ SOLN
INTRAMUSCULAR | Status: AC
  Filled 2011-07-28: qty 2

## 2011-07-28 MED ORDER — ASPIRIN 81 MG PO CHEW: 324.0000 mg | CHEWABLE_TABLET | ORAL | Status: DC

## 2011-07-28 MED ORDER — ASPIRIN EC 325 MG PO TBEC
325.0000 mg | DELAYED_RELEASE_TABLET | Freq: Every day | ORAL | Status: DC
  Administered 2011-07-29: 325 mg via ORAL
  Filled 2011-07-28: qty 1

## 2011-07-28 MED ORDER — HEPARIN (PORCINE) IN NACL 2-0.9 UNIT/ML-% IJ SOLN
INTRAMUSCULAR | Status: AC
  Filled 2011-07-28: qty 1000

## 2011-07-28 MED ORDER — ACETAMINOPHEN 325 MG PO TABS
650.0000 mg | ORAL_TABLET | ORAL | Status: DC | PRN
  Administered 2011-07-28: 650 mg via ORAL

## 2011-07-28 MED ORDER — SODIUM CHLORIDE 0.9 % IJ SOLN: 3.0000 mL | INTRAMUSCULAR | Status: DC | PRN

## 2011-07-28 MED ORDER — SODIUM CHLORIDE 0.9 % IV SOLN
INTRAVENOUS | Status: AC
  Administered 2011-07-28: 18:00:00 via INTRAVENOUS

## 2011-07-28 MED ORDER — SODIUM CHLORIDE 0.9 % IV SOLN: 250.0000 mL | INTRAVENOUS | Status: DC | PRN

## 2011-07-28 MED ORDER — LIDOCAINE HCL (PF) 1 % IJ SOLN
INTRAMUSCULAR | Status: AC
  Filled 2011-07-28: qty 30

## 2011-07-28 NOTE — Progress Notes (Signed)
ANTICOAGULATION CONSULT NOTE - Follow Up Consult  Pharmacy Consult for Lovenox Indication: chest pain/ACS  Allergies  Allergen Reactions  . Contrast Media (Iodinated Diagnostic Agents) Other (See Comments)    headache    Patient Measurements: Height: 5\' 4"  (162.6 cm) Weight: 169 lb 14.4 oz (77.066 kg) IBW/kg (Calculated) : 54.7  Heparin Dosing Weight:   Vital Signs: Temp: 98.1 F (36.7 C) (07/12 0500) Temp src: Oral (07/12 0500) BP: 108/63 mmHg (07/12 0500) Pulse Rate: 60  (07/12 0500)  Labs:  Basename 07/27/11 0550 07/26/11 0600 07/26/11 0505 07/25/11 2027 07/25/11 1231 07/25/11 1138  HGB -- -- 11.4* -- -- 13.1  HCT -- -- 34.7* -- -- 38.9  PLT -- -- 189 -- -- 212  APTT -- -- -- -- -- --  LABPROT -- -- -- -- -- 13.5  INR -- -- -- -- -- 1.01  HEPARINUNFRC -- -- -- -- -- --  CREATININE 1.57* -- 1.34* -- 1.48* --  CKTOTAL -- 45 -- 53 65 --  CKMB -- 2.0 -- 2.1 2.6 --  TROPONINI -- <0.30 -- <0.30 <0.30 --    Estimated Creatinine Clearance: 30.2 ml/min (by C-G formula based on Cr of 1.57).   Assessment: 53 YOF with history of VTach s/p ICD with mutliple replacements. She stated she was weak x 3 weeks before admission. States she lost 20 pounds in 6 months without dieting. Complains of dizziness, weakness, nausea, back pain and flank pain. BP and HR both low at admission.   Anticoagulation- on Lovenox 80mg  Q24 hr for possible ACS; SCr has gone back up to 1.57.CrCl borderline at 30.  Cardiovascular- ICD since 1993, Hx of MI, HTN; on disopyramide for VT, isosorbide mononitrate, ASA81mg . Dyslipidemia; on lipitor. Having some issue with hypotension and bradycardia. BP 108/63, 60 this am.  Endo: Hgb A1C 6.1  Gastrointestinal / Nutrition- GERD; not on medications PTA  Neurology- depression; on paxil  Nephrology- SCr 1.57 (up from 1.34); CrCl ~53mL/min using AdjBW of 64.6kg; Appears to be baseline  Pulmonary- none  Hematology / Oncology- none  PTA Medication Issues-  none  Goal of Therapy:  Anti-Xa level 0.6-1.2 units/ml 4hrs after LMWH dose given Monitor platelets by anticoagulation protocol: Yes   Plan: Lovenox 80mg /24h. Will f/u plans for d/c    Wayland Salinas 07/28/2011,9:10 AM

## 2011-07-28 NOTE — H&P (Signed)
   Pt was reexamined and existing H & P reviewed. No changes found.  Lorretta Harp, MD [facility] 07/28/2011 3:54 PM

## 2011-07-28 NOTE — Op Note (Signed)
[name] is a 78 y.o. female    [id number] LOCATION:  FACILITY: [facility]  PHYSICIAN: Quay Burow, M.D. 10-Jul-1931   DATE OF PROCEDURE:  07/28/2011  DATE OF DISCHARGE:  [facility]  CARDIAC CATHETERIZATION     History obtained from chart review.  Subjective:  [name] is a 78 yo female with a PMH of VT, s/p remote ICD in 1993 with multiple replacements, the last one in 2009 by Dr [name], recent chronic bradycardia who presented to hospital with multiple CC including fatigue for 3 wks, lightheadedness and dizzyness. Rpts chest pain every few hours of brick on her chest each lasting for a few minutes, not related to position, radiates to L subscapular region. Confirms near syncope when standing. Denies arm and jaw pain, N/V, SOB, change in vision, level of consciousness, weakness or numbness of extremities.  Over hospital course pt has been orthostatic with increasing Cr on labs, given NS bolus, decreased diuresis, ACEI and BB disc't. States continued lack of appetite, has no desire to eat foods she normally enjoys, denies GI upset just lack of will to make her eat.      PROCEDURE DESCRIPTION:    The patient was brought to the second floor  [facility] Cardiac cath lab in the postabsorptive state. She was  premedicated with Valium 5 mg by mouth, IV Versed and fentanyl, IV Solu-Medrol, Benadryl, and Pepcid for contrast allergy prophylaxis. Her right groin was prepped and shaved in usual sterile fashion. Xylocaine 1% was used  for local anesthesia. A 5 French sheath was inserted into the the common femoral  artery using standard Seldinger technique. Judkins diagnostic catheters a 5 [place]  was used for selective coronary angiography. Left ventriculography was not performed. Visipaque dye was used for the entirety of the case. Retrograde aortic pressure was monitored during the case.  HEMODYNAMICS:    AO SYSTOLIC/AO DIASTOLIC: [id number]    ANGIOGRAPHIC  RESULTS:   1. Left main; normal  2. LAD; approximately the stent was widely patent. There was a first diagonal branch that had a 80% ostial stenosis. 3. Left circumflex; the circumflex stent was widely patent..  4. Right coronary artery; this is a dominant vessel with minimal luminal irregularities. 5. Left ventriculography;was not performed.  IMPRESSION:[name] has noncritical CAD. He does have an osteal diagonal branch which I feel can be treated medically. The sheath was removed and pressure was held and the groin to achieve hemostasis. The patient lives condition.  Lorretta Harp MD, [facility] 07/28/2011 4:26 PM

## 2011-07-28 NOTE — Progress Notes (Signed)
Subjective:  [name] is a 78 yo female with a PMH of VT, s/p remote ICD in 1993 with multiple replacements, the last one in 2009 by Dr [name], recent chronic bradycardia who presented to hospital with multiple CC including fatigue for 3 wks, lightheadedness and dizzyness.   Rpts chest pain every few hours of brick on her chest each lasting for a few minutes, not related to position, radiates to L subscapular region.  Confirms near syncope when standing. Denies arm and jaw pain, N/V, SOB, change in vision, level of consciousness, weakness or numbness of extremities. Over hospital course pt has been orthostatic with increasing Cr on labs, given NS bolus, decreased diuresis, ACEI and BB disc't. States continued lack of appetite, has no desire to eat foods she normally enjoys, denies GI upset just lack of will to make her eat.  Past Medical History  Diagnosis Date  . ICD (implantable cardiac defibrillator) in place 21    pt states history of atrial fib/vent tachy-cardiac arrest 93 when icd placed. preop prescription for implantable device faxed to [facility]  . Hypertension     EKG done 1 week ago [facility]-requested  . GERD (gastroesophageal reflux disease)     omeprazole  . Dysrhythmia   . Diverticulitis   . Myocardial infarction     history of cardiac arrest 93-stents, on plavix-d/c 5 days prior to surger  . Shortness of breath   . H/O hiatal hernia   . Orthostatic hypotension 07/27/2011  . Chronic sinus bradycardia 07/25/2011  . CAD, s/p LAD and Dx stenting in 2003- patent 2006, Myoview low risk 2010. NL LVF 2D 10/12 12/03/2007    Qualifier: Diagnosis of  By: Sharlett Iles MD Byrd Hesselbach   . Chronic renal insufficiency, stage III (moderate), Scr 1.5 in the past 07/25/2011  . History of DVT in the past, not on Coumadin now 07/25/2011  . HYPERCHOLESTEROLEMIA 12/02/2007    Qualifier: Diagnosis of  By: Nils Pyle CMA (AAMA), [name]    . Nephrolithiasis, just saw Dr [name] 07/25/2011  . EXTERNAL  HEMORRHOIDS 12/02/2007    Qualifier: Diagnosis of  By: Nils Pyle CMA ([place]), [name]    . ARTHRITIS 12/02/2007    Qualifier: Diagnosis of  By: Nils Pyle CMA (AAMA), [name]      Objective:  Vital Signs in the last 24 hours: Temp:  [98 F (36.7 C)-98.1 F (36.7 C)] 98.1 F (36.7 C) (07/12 0500) Pulse Rate:  [60-89] 89  (07/12 1033) Resp:  [18-20] 18  (07/12 0500) BP: (89-149)/(60-84) 114/78 mmHg (07/12 1033) SpO2:  [93 %-100 %] 98 % (07/12 1033) Weight:  [77.066 kg (169 lb 14.4 oz)] 77.066 kg (169 lb 14.4 oz) (07/12 0500)  Intake/Output from previous day: 07/11 0701 - 07/12 0700 In: 300 [P.O.:300] Out: -  Intake/Output from this shift: Total I/O In: 240 [P.O.:240] Out: -   Physical Exam: General appearance: alert, cooperative, appears older than stated age, fatigued and no distress Neck: no carotid bruit, no JVD, supple, symmetrical, trachea midline and thyroid not enlarged, symmetric, no tenderness/mass/nodules Lungs: clear to auscultation bilaterally Heart: regular rate and rhythm, S1: distant and muffled, S2: normal intensity, without audible splitting, no murmurs clicks or rubs and no S3 or S4 Abdomen: soft, non-tender; bowel sounds normal; no masses,  no organomegaly Extremities: extremities normal, atraumatic, no cyanosis or edema and varicose veins noted Pulses: 2+ and symmetric over radial, posterior tibial and dorsalis pedis Skin: Skin color, texture, turgor normal. No rashes or lesions Neurologic: Mental status: Alert,  oriented, thought content appropriate Cranial nerves: normal Sensory: decreased sensation over plantar surface of L foot Coordination: chronic tremor with intential motion of hands, coordination finger to nose fully intact Psych:  mood seems concerned, lackluster energy, affect is related and appropriate.  Interact cooperatively and pleasantly with many appropriate questions demonstrating strong insight and judgement.  Thought content related to fatigue with  goal oriented processes.    Lab Results:  Basename 07/26/11 0505 07/25/11 1138  WBC 6.3 6.2  HGB 11.4* 13.1  PLT 189 212    Basename 07/27/11 0550 07/26/11 0505  NA 137 139  K 4.2 4.4  CL 102 105  CO2 27 25  GLUCOSE 83 90  BUN 27* 25*  CREATININE 1.57* 1.34*   Cardiac Panel (last 3 results)  Basename 07/26/11 0600 07/25/11 2027 07/25/11 1231  CKTOTAL 45 53 65  CKMB 2.0 2.1 2.6  TROPONINI <0.30 <0.30 <0.30  RELINDX RELATIVE INDEX IS INVALID RELATIVE INDEX IS INVALID RELATIVE INDEX IS INVALID    Hepatic Function Panel  Basename 07/25/11 1231  PROT 7.1  ALBUMIN 3.6  AST 19  ALT 21  ALKPHOS 61  BILITOT 0.5  BILIDIR --  IBILI --   Imaging: Clinical Data: Chest pain and "fluttering".  Hypertension.  Cardiac arrhythmia. CHEST - 2 VIEW Comparison: 12/06/2009 Findings: Lower thoracic and marked upper lumbar spondylosis. Pacers and AICD devices terminate over the right ventricle.  No definite lead discontinuity identified.  There is one extra lead identified, not attached to a battery. Midline trachea.  Borderline cardiomegaly.     Mediastinal contours otherwise within normal limits.  No pleural effusion or pneumothorax.  No congestive failure. Clear lungs. Prominent gas-filled bowel loops in the upper abdomen. IMPRESSION: No acute cardiopulmonary disease. Original Report Authenticated By: Areta Haber, M.D.  Cardiac Studies: EKG Nov 2011 showing bradycardia, demonstrated on this admission 07/25/11 in ED. Pacemaker interrogation during this admission OK per Dr. Gwenlyn Found.  Orthostatics performed  Assessment/Plan:  Principal Problem:  *Chest pain, r/o cardiac, negative MI Active Problems:  Chronic sinus bradycardia  ICD (implantable cardiac defibrillator) in place, VT in 1993, multiple device changes, last in 2009 - followed by Dr. Jacklynn Bue in office  Orthostatic hypotension  HYPERCHOLESTEROLEMIA  CAD, s/p LAD and Dx stenting in 2003- patent 2006, Myoview low risk  2010. NL LVF 2D 10/12  Fatigue X 3wks  Chronic renal insufficiency, stage III (moderate), Scr 1.5 in the past  History of DVT in the past, not on Coumadin now  Nephrolithiasis, just saw Dr [name]  Pt well known to Dr. Gwenlyn Found. Last myoview was in 2010 and was low risk, during this admission, team was considering OP myoview. Due to new c/o brick on chest type pain that is intermittent but frequent, decision has been made to schedule for cath today.  Orthostatic testing and bradycardia still positive despite medication changes.  Will hold further medication management changes until cath procedure results are available.  Discussed plan with patient and addressed questions and concerns, she agrees with plan.  Please note- due to recurrent chest pain and continued orthostatic hypotension discussed with Dr. Gwenlyn Found will proceed with cardiac cath today.     LOS: 3 days    Lyncoln Maskell R 07/28/2011, 11:02 AM

## 2011-07-29 DIAGNOSIS — I959 Hypotension, unspecified: Secondary | ICD-10-CM | POA: Diagnosis present

## 2011-07-29 DIAGNOSIS — I2510 Atherosclerotic heart disease of native coronary artery without angina pectoris: Secondary | ICD-10-CM | POA: Diagnosis not present

## 2011-07-29 DIAGNOSIS — R079 Chest pain, unspecified: Secondary | ICD-10-CM | POA: Diagnosis not present

## 2011-07-29 LAB — CBC
HCT: 36 % (ref 36.0–46.0)
MCH: 31.1 pg (ref 26.0–34.0)
MCHC: 33.9 g/dL (ref 30.0–36.0)
MCV: 91.8 fL (ref 78.0–100.0)
Platelets: 212 10*3/uL (ref 150–400)
RDW: 13.1 % (ref 11.5–15.5)

## 2011-07-29 LAB — BASIC METABOLIC PANEL
BUN: 26 mg/dL — ABNORMAL HIGH (ref 6–23)
CO2: 21 mEq/L (ref 19–32)
Calcium: 9.4 mg/dL (ref 8.4–10.5)
Chloride: 95 mEq/L — ABNORMAL LOW (ref 96–112)
Creatinine, Ser: 1.36 mg/dL — ABNORMAL HIGH (ref 0.50–1.10)
GFR calc Af Amer: 42 mL/min — ABNORMAL LOW (ref >90)
GFR calc non Af Amer: 36 mL/min — ABNORMAL LOW (ref >90)
Glucose, Bld: 264 mg/dL — ABNORMAL HIGH (ref 70–99)
Potassium: 4.3 mEq/L (ref 3.5–5.1)
Sodium: 132 mEq/L — ABNORMAL LOW (ref 135–145)

## 2011-07-29 NOTE — Progress Notes (Signed)
Pt. Seen and examined. Agree with the NP/PA-C note as written.  No critical disease on cardiac catheterization. Feels somewhat better than admission. Certainly disopyramide can be associated with orthostatic hypotension, which is one reason it is not used much anymore. Her symptoms seemed to be acute, though, although she has been on this medication for some time. She has also had 20 lb unexplained weight loss. Would continue it for now. Creatinine has been stable. [place] for discharge later today. Follow-up with Dr. Gwenlyn Found.  Pixie Casino, MD, [facility] Attending Cardiologist [facility] [place]

## 2011-07-29 NOTE — Discharge Summary (Signed)
Pixie Casino, MD, [facility] Attending Cardiologist [facility] [place]

## 2011-07-29 NOTE — Progress Notes (Signed)
Patient's systolic pressure dropped over the last couple of hours from the 120's/100's to the 90's. Dr. On call notified. Told to monitor patient and to alert her if the patient's SBP drops below 90. Patient asymptomatic and resting comfortably, easily aroused. Afebrile, with stable HR in the 60-80's, and 02 sats in the 90's. Will continue to monitor patient.

## 2011-07-29 NOTE — Discharge Summary (Signed)
Patient ID: [name],  MRN: [id number], DOB/AGE: 05-10-1931 78 y.o.  Admit date: 07/25/2011 Discharge date: 07/29/2011  Primary Care Provider:  Primary Cardiologist: Dr Rollene Fare  Discharge Diagnoses Principal Problem:  *Chest pain, r/o cardiac, negative MI Active Problems:  Fatigue X 3wks, presumably secondary to medications  CAD, s/p LAD and Dx stenting in 2003- patent 2006 and 07/28/11. Residual 80% Dx1  Chronic sinus bradycardia  ICD (implantable cardiac defibrillator) in place, VT in 1993, multiple device changes, last in 2009  Chronic renal insufficiency, stage III (moderate), Scr 1.5 in the past  Hypotension, meds stopped this admission  HYPERCHOLESTEROLEMIA  History of DVT in the past, not on Coumadin now  Nephrolithiasis, just saw Dr [name]  Orthostatic hypotension    Procedures: Cardiac cath 07/28/11   Hospital Course : 78 y/o with a history of VT, s/p remote ICD in 1993 with multiple replacements, the last one in 2009 by Dr [name]. She has been "weak" for 3 wks. She went to her primary MD (dr D.Laurance Flatten). They called our office who instructed them to send the pt to the ER. The pt has multiple complaints- dizzy, weak, low B/P, nausea, back pain, flank pain, andLt chest pain to her back. She has not taken NTG. She also says she has lost 20lbs in 22mos without dieting. In Dr Tawanna Sat office her HR was 50. Her EOL HR is 50 as well but an EKG from Nov 2011 also shows her HR to be 50. She was admitted for further evaluation. After admission her blood pressure was noted to be low as well as her heart rate. Her ICD was checked and is functioning normally. Her blood pressure and heart improved somewhat but she continued to complain of chest pain. Cath done 07/28/11 showed patent stents with an 80% DX1 that will be treated medically. We discussed the possibility of Norpace side effects- anorexia, fatigue, tremor for one year. She has been on Norpace since 2009. At this time we will  continue her on her current dose of 200mg  BID. She is improved symptomatically at discharge. She will follow up with Dr Rollene Fare in a couple of weeks.   Discharge Vitals:  Blood pressure 93/45, pulse 55, temperature 97.8 F (36.6 C), temperature source Oral, resp. rate 18, height 5\' 4"  (1.626 m), weight 78.926 kg (174 lb), SpO2 95.00%.    Labs: Results for orders placed during the hospital encounter of 07/25/11 (from the past 48 hour(s))  BASIC METABOLIC PANEL     Status: Abnormal   Collection Time   07/28/11 10:37 AM      Component Value Range Comment   Sodium 138  135 - 145 mEq/L    Potassium 3.5  3.5 - 5.1 mEq/L    Chloride 100  96 - 112 mEq/L    CO2 25  19 - 32 mEq/L    Glucose, Bld 137 (*) 70 - 99 mg/dL    BUN 22  6 - 23 mg/dL    Creatinine, Ser 1.37 (*) 0.50 - 1.10 mg/dL    Calcium 9.4  8.4 - 10.5 mg/dL    GFR calc non Af Amer 36 (*) >90 mL/min    GFR calc Af Amer 42 (*) >90 mL/min   PRO B NATRIURETIC PEPTIDE     Status: Normal   Collection Time   07/28/11 10:37 AM      Component Value Range Comment   Pro B Natriuretic peptide (BNP) 336.1  0 - 450 pg/mL   CBC  Status: Normal   Collection Time   07/29/11  6:45 AM      Component Value Range Comment   WBC 10.1  4.0 - 10.5 K/uL    RBC 3.92  3.87 - 5.11 MIL/uL    Hemoglobin 12.2  12.0 - 15.0 g/dL    HCT 36.0  36.0 - 46.0 %    MCV 91.8  78.0 - 100.0 fL    MCH 31.1  26.0 - 34.0 pg    MCHC 33.9  30.0 - 36.0 g/dL    RDW 13.1  11.5 - 15.5 %    Platelets 212  150 - 400 K/uL   BASIC METABOLIC PANEL     Status: Abnormal   Collection Time   07/29/11  9:40 AM      Component Value Range Comment   Sodium 132 (*) 135 - 145 mEq/L    Potassium 4.3  3.5 - 5.1 mEq/L NO VISIBLE HEMOLYSIS   Chloride 95 (*) 96 - 112 mEq/L    CO2 21  19 - 32 mEq/L    Glucose, Bld 264 (*) 70 - 99 mg/dL    BUN 26 (*) 6 - 23 mg/dL    Creatinine, Ser 1.36 (*) 0.50 - 1.10 mg/dL    Calcium 9.4  8.4 - 10.5 mg/dL    GFR calc non Af Amer 36 (*) >90 mL/min      GFR calc Af Amer 42 (*) >90 mL/min     Disposition:  Follow-up Information    Follow up with Rebecca Eaton, MD on 08/01/2011. (at   4:15pm)    Contact information:   [facility] [place] [address] [place] [phone]          Discharge Medications:  Medication List  As of 07/29/2011 11:05 AM   TAKE these medications         aspirin EC 81 MG tablet   Take 81 mg by mouth daily.      CITRACAL + D PO   Take 1 tablet by mouth daily.      clopidogrel 75 MG tablet   Commonly known as: PLAVIX   Take 75 mg by mouth daily.      disopyramide 100 MG 12 hr capsule   Commonly known as: NORPACE CR   Take 2 capsules (200 mg total) by mouth every 12 (twelve) hours.      isosorbide mononitrate 15 mg Tb24   Commonly known as: IMDUR   Take 0.5 tablets (15 mg total) by mouth daily.      lisinopril 10 MG tablet   Commonly known as: PRINIVIL,ZESTRIL   Take 10 mg by mouth daily.      metoprolol tartrate 25 MG tablet   Commonly known as: LOPRESSOR   Take 0.5 tablets (12.5 mg total) by mouth 2 (two) times daily.      PARoxetine 20 MG tablet   Commonly known as: PAXIL   Take 20 mg by mouth every morning.      rosuvastatin 40 MG tablet   Commonly known as: CRESTOR   Take 20 mg by mouth daily.      torsemide 20 MG tablet   Commonly known as: DEMADEX   Take 20 mg by mouth daily as needed. For fluid retention      Vitamin D 1000 UNITS capsule   Take 2,000 Units by mouth daily.             Duration of Discharge Encounter: Greater  than 30 minutes including physician time.  Angelena Form PA-C 07/29/2011 11:05 AM

## 2011-07-29 NOTE — Progress Notes (Signed)
Subjective:  Overall feels better than when she was admitted.  Objective:  Vital Signs in the last 24 hours: Temp:  [97.8 F (36.6 C)-98.1 F (36.7 C)] 97.8 F (36.6 C) (07/13 0500) Pulse Rate:  [55-89] 55  (07/13 0500) Resp:  [18] 18  (07/13 0500) BP: (89-141)/(45-78) 93/45 mmHg (07/13 0500) SpO2:  [93 %-100 %] 95 % (07/13 0500) Weight:  [78.926 kg (174 lb)] 78.926 kg (174 lb) (07/13 0500)  Intake/Output from previous day:  Intake/Output Summary (Last 24 hours) at 07/29/11 0924 Last data filed at 07/28/11 1600  Gross per 24 hour  Intake      3 ml  Output    300 ml  Net   -297 ml    Physical Exam: General appearance: alert, cooperative, no distress and generalized tremor Lungs: clear to auscultation bilaterally Heart: regular rate and rhythm Rt groin without hematoma or echymosis   Rate: 55  Rhythm: normal sinus rhythm, sinus brady, and !st degree AVB  Lab Results:  Basename 07/29/11 0645  WBC 10.1  HGB 12.2  PLT 212    Basename 07/28/11 1037 07/27/11 0550  NA 138 137  K 3.5 4.2  CL 100 102  CO2 25 27  GLUCOSE 137* 83  BUN 22 27*  CREATININE 1.37* 1.57*   No results found for this basename: TROPONINI:2,CK,MB:2 in the last 72 hours Hepatic Function Panel No results found for this basename: PROT,ALBUMIN,AST,ALT,ALKPHOS,BILITOT,BILIDIR,IBILI in the last 72 hours No results found for this basename: CHOL in the last 72 hours No results found for this basename: INR in the last 72 hours  Imaging: Imaging results have been reviewed  Cardiac Studies:  Assessment/Plan:   Principal Problem:  *Chest pain, r/o cardiac, negative MI Active Problems:  Fatigue X 3wks  CAD, s/p LAD and Dx stenting in 2003- patent 2006 and 07/28/11. Residual 80% Dx1  Chronic sinus bradycardia  ICD (implantable cardiac defibrillator) in place, VT in 1993, multiple device changes, last in 2009  Chronic renal insufficiency, stage III (moderate), Scr 1.5 in the past  Hypotension, meds  stopped this admission  HYPERCHOLESTEROLEMIA  History of DVT in the past, not on Coumadin now  Nephrolithiasis, just saw Dr [name]  Orthostatic hypotension   Plan- Check BMP after angiogram, (history of stage 3 CRI). Ambulate, ? Home later today. ? Check Norpace level, fatigue and dizziness 1-10% adverse  reaction reported with Norpace. She was put on Norpace around 2009 by EP. She has been on varying doses. She says she has had tremor for about a year.   Kerin Ransom PA-C 07/29/2011, 9:24 AM

## 2011-07-29 NOTE — Progress Notes (Signed)
Discharge review done with patient.   Patient acknowledged understanding of information provided.  Prescriptions called to pharmacy by PA.  Patient is stable and discharged home with family member. Jorene Guest

## 2011-08-01 DIAGNOSIS — I472 Ventricular tachycardia: Secondary | ICD-10-CM | POA: Diagnosis not present

## 2011-08-01 DIAGNOSIS — Z4502 Encounter for adjustment and management of automatic implantable cardiac defibrillator: Secondary | ICD-10-CM | POA: Diagnosis not present

## 2011-08-01 DIAGNOSIS — I2510 Atherosclerotic heart disease of native coronary artery without angina pectoris: Secondary | ICD-10-CM | POA: Diagnosis not present

## 2011-08-16 DIAGNOSIS — H353290 Exudative age-related macular degeneration, unspecified eye, stage unspecified: Secondary | ICD-10-CM | POA: Diagnosis not present

## 2011-08-16 DIAGNOSIS — H35359 Cystoid macular degeneration, unspecified eye: Secondary | ICD-10-CM | POA: Diagnosis not present

## 2011-08-16 DIAGNOSIS — H35059 Retinal neovascularization, unspecified, unspecified eye: Secondary | ICD-10-CM | POA: Diagnosis not present

## 2011-08-17 NOTE — ED Provider Notes (Signed)
Medical screening examination/treatment/procedure(s) were performed by non-physician practitioner and as supervising physician I was immediately available for consultation/collaboration.   Betzy Barbier, MD 08/17/11 2312 

## 2011-08-23 ENCOUNTER — Encounter: Payer: Self-pay | Admitting: Gastroenterology

## 2011-08-23 ENCOUNTER — Ambulatory Visit (INDEPENDENT_AMBULATORY_CARE_PROVIDER_SITE_OTHER): Payer: Medicare Other | Admitting: Gastroenterology

## 2011-08-23 VITALS — BP 124/60 | HR 58 | Ht 64.0 in | Wt 177.0 lb

## 2011-08-23 DIAGNOSIS — K219 Gastro-esophageal reflux disease without esophagitis: Secondary | ICD-10-CM

## 2011-08-23 DIAGNOSIS — K52831 Collagenous colitis: Secondary | ICD-10-CM

## 2011-08-23 DIAGNOSIS — K5730 Diverticulosis of large intestine without perforation or abscess without bleeding: Secondary | ICD-10-CM

## 2011-08-23 DIAGNOSIS — R197 Diarrhea, unspecified: Secondary | ICD-10-CM

## 2011-08-23 DIAGNOSIS — K5289 Other specified noninfective gastroenteritis and colitis: Secondary | ICD-10-CM

## 2011-08-23 MED ORDER — BUDESONIDE 9 MG PO TB24: 1.0000 | ORAL_TABLET | Freq: Every day | ORAL | Status: DC

## 2011-08-23 NOTE — Progress Notes (Signed)
History of Present Illness:  This is a very complicated and very needy 78 year old Caucasian female with multiple, multiple medical problems and multiple physicians. She was recently hospitalized for noncardiac chest pain, and is on multiple cardiac medications. She has a history of chronic GERD and is on daily PPI therapy. She is up-to-date on her endoscopy and colonoscopy exams, in fact, has had multiple recurrent GI workups for chronic unexplained diarrhea. She was finally determined years ago to have microscopic-lymphocytic colitis with excellent response to Entocort therapy but she allegedly could not afford this medication. She now presents again with watery diarrhea and abdominal cramping. Other complaints are nausea, chronic weakness and fatigue. She has a defibrillator in place, has a previous history of DVTs, but apparently is not anticoagulated at this time. Recent repeat cardiac catheterization showed widely patent cardiac stents. The patient has had antibiotic exposure, but denies fever, chills, bloody diarrhea, anorexia or weight loss or any specific food intolerance.  I have reviewed this patient's present history, medical and surgical past history, allergies and medications.     ROS: The remainder of the 10 point ROS is negative     Physical Exam: Blood pressure 124/60, pulse 58 and regular, weight 177 pounds and BMI 30.38. I cannot appreciate stigmata of chronic liver disease. General well developed well nourished patient in no acute distress, appearing older than stated age Eyes PERRLA, no icterus, fundoscopic exam per opthamologist Skin no lesions noted Neck supple, no adenopathy, no thyroid enlargement, no tenderness Chest clear to percussion and auscultation Heart no significant murmurs, gallops or rubs noted Abdomen no hepatosplenomegaly masses or tenderness, BS normal.  Rectal inspection normal no fissures, or fistulae noted.  No masses or tenderness on digital exam. Stool  guaiac negative. Extremities no acute joint lesions, edema, phlebitis or evidence of cellulitis. Neurologic patient oriented x 3, cranial nerves intact, no focal neurologic deficits noted. Psychological mental status normal and normal affect.  Assessment and plan: Her rectal exam today shows formed stool in the rectal vault which is not diarrhea, bloody, or falcine melena. She has had chronic diarrhea for over 20 years with multiple evaluations and a final diagnosis of microscopic colitis with associated watery diarrhea. She's been evaluated for malabsorption, H. Gloria disease, celiac disease, and other sick retorted GI disorders, all workups otherwise normal. I see no need to repeat these tests at this time. I have given her samples of Uceris 9 mg a day for her colitis. It will be difficult to manage this patient since she refuses to by this class of medication. We have limited supplies of samples available. I did ask her to bring in a stool for C. difficile toxin assay. Otherwise she is to continue her multiple medications per her multiple physicians. Prolonged office visit today with her sister-in-law present.  Encounter Diagnosis  Name Primary?  . Diarrhea Yes

## 2011-08-23 NOTE — Patient Instructions (Addendum)
We have given you samples of the following medication to take: Uceris one tablet daily.  Your physician has requested that you go to the basement for the following lab work before leaving today: C. Diff PCR.  CC:Dr. Redge Gainer

## 2011-09-20 DIAGNOSIS — H35359 Cystoid macular degeneration, unspecified eye: Secondary | ICD-10-CM | POA: Diagnosis not present

## 2011-09-20 DIAGNOSIS — H35059 Retinal neovascularization, unspecified, unspecified eye: Secondary | ICD-10-CM | POA: Diagnosis not present

## 2011-09-20 DIAGNOSIS — H353290 Exudative age-related macular degeneration, unspecified eye, stage unspecified: Secondary | ICD-10-CM | POA: Diagnosis not present

## 2011-11-01 DIAGNOSIS — H35059 Retinal neovascularization, unspecified, unspecified eye: Secondary | ICD-10-CM | POA: Diagnosis not present

## 2011-11-01 DIAGNOSIS — H353290 Exudative age-related macular degeneration, unspecified eye, stage unspecified: Secondary | ICD-10-CM | POA: Diagnosis not present

## 2011-11-09 DIAGNOSIS — E785 Hyperlipidemia, unspecified: Secondary | ICD-10-CM | POA: Diagnosis not present

## 2011-11-09 DIAGNOSIS — Z23 Encounter for immunization: Secondary | ICD-10-CM | POA: Diagnosis not present

## 2011-11-09 DIAGNOSIS — I10 Essential (primary) hypertension: Secondary | ICD-10-CM | POA: Diagnosis not present

## 2011-11-09 DIAGNOSIS — E559 Vitamin D deficiency, unspecified: Secondary | ICD-10-CM | POA: Diagnosis not present

## 2011-11-09 DIAGNOSIS — I2510 Atherosclerotic heart disease of native coronary artery without angina pectoris: Secondary | ICD-10-CM | POA: Diagnosis not present

## 2011-11-09 DIAGNOSIS — K219 Gastro-esophageal reflux disease without esophagitis: Secondary | ICD-10-CM | POA: Diagnosis not present

## 2011-12-06 DIAGNOSIS — H353290 Exudative age-related macular degeneration, unspecified eye, stage unspecified: Secondary | ICD-10-CM | POA: Diagnosis not present

## 2011-12-06 DIAGNOSIS — H35359 Cystoid macular degeneration, unspecified eye: Secondary | ICD-10-CM | POA: Diagnosis not present

## 2011-12-12 DIAGNOSIS — I2510 Atherosclerotic heart disease of native coronary artery without angina pectoris: Secondary | ICD-10-CM | POA: Diagnosis not present

## 2011-12-12 DIAGNOSIS — I472 Ventricular tachycardia: Secondary | ICD-10-CM | POA: Diagnosis not present

## 2011-12-12 DIAGNOSIS — I10 Essential (primary) hypertension: Secondary | ICD-10-CM | POA: Diagnosis not present

## 2011-12-13 ENCOUNTER — Other Ambulatory Visit (HOSPITAL_COMMUNITY): Payer: Self-pay | Admitting: Cardiovascular Disease

## 2011-12-13 DIAGNOSIS — I429 Cardiomyopathy, unspecified: Secondary | ICD-10-CM

## 2011-12-13 DIAGNOSIS — I509 Heart failure, unspecified: Secondary | ICD-10-CM

## 2011-12-27 ENCOUNTER — Ambulatory Visit (HOSPITAL_COMMUNITY): Payer: Medicare Other

## 2012-01-01 ENCOUNTER — Ambulatory Visit (HOSPITAL_COMMUNITY)
Admission: RE | Admit: 2012-01-01 | Discharge: 2012-01-01 | Disposition: A | Discharge status: Home / Self Care | Payer: Medicare Other | Source: Ambulatory Visit | Attending: Cardiovascular Disease | Admitting: Cardiovascular Disease

## 2012-01-01 DIAGNOSIS — Z950 Presence of cardiac pacemaker: Secondary | ICD-10-CM | POA: Insufficient documentation

## 2012-01-01 DIAGNOSIS — R0609 Other forms of dyspnea: Secondary | ICD-10-CM | POA: Insufficient documentation

## 2012-01-01 DIAGNOSIS — I509 Heart failure, unspecified: Secondary | ICD-10-CM | POA: Insufficient documentation

## 2012-01-01 DIAGNOSIS — I429 Cardiomyopathy, unspecified: Secondary | ICD-10-CM

## 2012-01-01 DIAGNOSIS — R0989 Other specified symptoms and signs involving the circulatory and respiratory systems: Secondary | ICD-10-CM | POA: Insufficient documentation

## 2012-01-01 NOTE — Progress Notes (Signed)
2D Echo Performed 01/01/2012    Marygrace Drought, RCS

## 2012-01-15 DIAGNOSIS — H353290 Exudative age-related macular degeneration, unspecified eye, stage unspecified: Secondary | ICD-10-CM | POA: Diagnosis not present

## 2012-01-15 DIAGNOSIS — H35059 Retinal neovascularization, unspecified, unspecified eye: Secondary | ICD-10-CM | POA: Diagnosis not present

## 2012-02-20 DIAGNOSIS — R05 Cough: Secondary | ICD-10-CM | POA: Diagnosis not present

## 2012-02-20 DIAGNOSIS — I10 Essential (primary) hypertension: Secondary | ICD-10-CM | POA: Diagnosis not present

## 2012-02-20 DIAGNOSIS — K219 Gastro-esophageal reflux disease without esophagitis: Secondary | ICD-10-CM | POA: Diagnosis not present

## 2012-02-20 DIAGNOSIS — E559 Vitamin D deficiency, unspecified: Secondary | ICD-10-CM | POA: Diagnosis not present

## 2012-02-20 DIAGNOSIS — E785 Hyperlipidemia, unspecified: Secondary | ICD-10-CM | POA: Diagnosis not present

## 2012-02-20 DIAGNOSIS — E039 Hypothyroidism, unspecified: Secondary | ICD-10-CM | POA: Diagnosis not present

## 2012-02-22 ENCOUNTER — Other Ambulatory Visit: Payer: Self-pay | Admitting: Family Medicine

## 2012-02-22 DIAGNOSIS — R911 Solitary pulmonary nodule: Secondary | ICD-10-CM

## 2012-02-26 ENCOUNTER — Ambulatory Visit
Admission: RE | Admit: 2012-02-26 | Discharge: 2012-02-26 | Disposition: A | Discharge status: Home / Self Care | Payer: Medicare Other | Source: Ambulatory Visit | Attending: Family Medicine | Admitting: Family Medicine

## 2012-02-26 DIAGNOSIS — R911 Solitary pulmonary nodule: Secondary | ICD-10-CM | POA: Diagnosis not present

## 2012-03-11 DIAGNOSIS — H353290 Exudative age-related macular degeneration, unspecified eye, stage unspecified: Secondary | ICD-10-CM | POA: Diagnosis not present

## 2012-04-05 ENCOUNTER — Other Ambulatory Visit (HOSPITAL_COMMUNITY): Payer: Self-pay | Admitting: Cardiovascular Disease

## 2012-04-05 DIAGNOSIS — E782 Mixed hyperlipidemia: Secondary | ICD-10-CM | POA: Diagnosis not present

## 2012-04-05 DIAGNOSIS — I509 Heart failure, unspecified: Secondary | ICD-10-CM

## 2012-04-05 DIAGNOSIS — R0602 Shortness of breath: Secondary | ICD-10-CM | POA: Diagnosis not present

## 2012-04-05 DIAGNOSIS — R6889 Other general symptoms and signs: Secondary | ICD-10-CM | POA: Diagnosis not present

## 2012-04-05 DIAGNOSIS — I2510 Atherosclerotic heart disease of native coronary artery without angina pectoris: Secondary | ICD-10-CM

## 2012-04-05 DIAGNOSIS — R079 Chest pain, unspecified: Secondary | ICD-10-CM

## 2012-04-05 DIAGNOSIS — I472 Ventricular tachycardia: Secondary | ICD-10-CM | POA: Diagnosis not present

## 2012-04-08 ENCOUNTER — Other Ambulatory Visit (HOSPITAL_COMMUNITY): Payer: Self-pay | Admitting: Cardiovascular Disease

## 2012-04-08 DIAGNOSIS — I70219 Atherosclerosis of native arteries of extremities with intermittent claudication, unspecified extremity: Secondary | ICD-10-CM

## 2012-04-09 ENCOUNTER — Other Ambulatory Visit (HOSPITAL_COMMUNITY): Payer: Self-pay | Admitting: Cardiovascular Disease

## 2012-04-09 DIAGNOSIS — I872 Venous insufficiency (chronic) (peripheral): Secondary | ICD-10-CM

## 2012-04-09 DIAGNOSIS — I83893 Varicose veins of bilateral lower extremities with other complications: Secondary | ICD-10-CM

## 2012-04-24 ENCOUNTER — Encounter (HOSPITAL_COMMUNITY): Payer: Medicare Other

## 2012-04-25 ENCOUNTER — Ambulatory Visit (HOSPITAL_COMMUNITY)
Admission: RE | Admit: 2012-04-25 | Discharge: 2012-04-25 | Disposition: A | Discharge status: Home / Self Care | Payer: Medicare Other | Source: Ambulatory Visit | Attending: Cardiovascular Disease | Admitting: Cardiovascular Disease

## 2012-04-25 DIAGNOSIS — I83893 Varicose veins of bilateral lower extremities with other complications: Secondary | ICD-10-CM

## 2012-04-25 DIAGNOSIS — I509 Heart failure, unspecified: Secondary | ICD-10-CM | POA: Diagnosis not present

## 2012-04-25 DIAGNOSIS — R5381 Other malaise: Secondary | ICD-10-CM | POA: Diagnosis not present

## 2012-04-25 DIAGNOSIS — I2510 Atherosclerotic heart disease of native coronary artery without angina pectoris: Secondary | ICD-10-CM | POA: Diagnosis not present

## 2012-04-25 DIAGNOSIS — R42 Dizziness and giddiness: Secondary | ICD-10-CM | POA: Diagnosis not present

## 2012-04-25 DIAGNOSIS — R0609 Other forms of dyspnea: Secondary | ICD-10-CM | POA: Insufficient documentation

## 2012-04-25 DIAGNOSIS — Z9861 Coronary angioplasty status: Secondary | ICD-10-CM | POA: Insufficient documentation

## 2012-04-25 DIAGNOSIS — I872 Venous insufficiency (chronic) (peripheral): Secondary | ICD-10-CM

## 2012-04-25 DIAGNOSIS — M7989 Other specified soft tissue disorders: Secondary | ICD-10-CM | POA: Diagnosis not present

## 2012-04-25 DIAGNOSIS — Z48812 Encounter for surgical aftercare following surgery on the circulatory system: Secondary | ICD-10-CM | POA: Diagnosis not present

## 2012-04-25 DIAGNOSIS — R5383 Other fatigue: Secondary | ICD-10-CM | POA: Insufficient documentation

## 2012-04-25 DIAGNOSIS — Z95810 Presence of automatic (implantable) cardiac defibrillator: Secondary | ICD-10-CM | POA: Insufficient documentation

## 2012-04-25 DIAGNOSIS — R0989 Other specified symptoms and signs involving the circulatory and respiratory systems: Secondary | ICD-10-CM | POA: Insufficient documentation

## 2012-04-25 DIAGNOSIS — R002 Palpitations: Secondary | ICD-10-CM | POA: Insufficient documentation

## 2012-04-25 DIAGNOSIS — I252 Old myocardial infarction: Secondary | ICD-10-CM | POA: Insufficient documentation

## 2012-04-25 DIAGNOSIS — R079 Chest pain, unspecified: Secondary | ICD-10-CM

## 2012-04-25 MED ORDER — TECHNETIUM TC 99M SESTAMIBI GENERIC - CARDIOLITE
30.0000 | Freq: Once | INTRAVENOUS | Status: AC | PRN
  Administered 2012-04-25: 30 via INTRAVENOUS

## 2012-04-25 MED ORDER — TECHNETIUM TC 99M SESTAMIBI GENERIC - CARDIOLITE
10.0000 | Freq: Once | INTRAVENOUS | Status: AC | PRN
  Administered 2012-04-25: 10 via INTRAVENOUS

## 2012-04-25 MED ORDER — REGADENOSON 0.4 MG/5ML IV SOLN
0.4000 mg | Freq: Once | INTRAVENOUS | Status: AC
  Administered 2012-04-25: 0.4 mg via INTRAVENOUS

## 2012-04-25 MED ORDER — AMINOPHYLLINE 25 MG/ML IV SOLN
75.0000 mg | Freq: Once | INTRAVENOUS | Status: AC
  Administered 2012-04-25: 75 mg via INTRAVENOUS

## 2012-04-25 NOTE — Procedures (Addendum)
[facility] [address] [place] [address] [phone]  Cardiology Nuclear Med Study  [name] is a 79 y.o. female     MRN : [id number]     DOB: 1931/06/09  Procedure Date: 04/25/2012  Nuclear Med Background Indication for Stress Test:  Stent Patency and Abnormal EKG History:  CAD;MI-10/1991;STENT/PTCA--2003;AICD--1993 Cardiac Risk Factors: Family History - CAD, Hypertension, Lipids, Overweight, PVD and DVT  Symptoms:  Dizziness, DOE, Fatigue, Light-Headedness and Palpitations   Nuclear Pre-Procedure Caffeine/Decaff Intake:  9:00pm NPO After: 7:00am   IV Site: R Antecubital  IV 0.9% NS with Angio Cath:  22g  Chest Size (in):  N/A IV Started by: Azucena Cecil, RN  Height: 5\' 7"  (1.702 m)  Cup Size: C  BMI:  Body mass index is 29.75 kg/(m^2). Weight:  190 lb (86.183 kg)   Tech Comments:  N/A    Nuclear Med Study 1 or 2 day study: 1 day  Stress Test Type:  [place]  Order Authorizing Provider:  Terance Ice, MD   Resting Radionuclide: Technetium 77m Sestamibi  Resting Radionuclide Dose: 10.8 mCi   Stress Radionuclide:  Technetium 27m Sestamibi  Stress Radionuclide Dose: 29.7 mCi           Stress Protocol Rest HR: 58 Stress HR:72  Rest BP: 163/100 Stress BP:173/91  Exercise Time (min): n/a METS: n/a          Dose of Adenosine (mg):  n/a Dose of Lexiscan: 0.4 mg  Dose of Atropine (mg): n/a Dose of Dobutamine: n/a mcg/kg/min (at max HR)  Stress Test Technologist: Mellody Memos, CCT Nuclear Technologist: Otho Perl, CNMT   Rest Procedure:  Myocardial perfusion imaging was performed at rest 45 minutes following the intravenous administration of Technetium 74m Sestamibi. Stress Procedure:  The patient received IV Lexiscan 0.4 mg over 15-seconds.  Technetium 62m Sestamibi injected at 30-seconds.  Due to patient's chest tightness and feeling flushed, she was given IV Aminophylline 75 mg. Symptoms were  resolved during recovery. There were no significant changes with Lexiscan.  Quantitative spect images were obtained after a 45 minute delay.  Transient Ischemic Dilatation (Normal <1.22):  0.98 Lung/Heart Ratio (Normal <0.45):  0.36 QGS EDV:  67 ml QGS ESV:  23 ml LV Ejection Fraction: 66%  Signed by      Rest ECG: NSR, LVH, PRWP V1-5  Stress ECG: No significant change from baseline ECG  QPS Raw Data Images:  Normal; no motion artifact; normal heart/lung ratio. Stress Images:  Small apical-mid septal defect Rest Images:  More pronounced defect apically and inferiorly compared to stress consistent with attenuation Subtraction (SDS):  Small region of apical and mid septal scar without significant ischemia.  Impression Exercise Capacity:  Lexiscan with no exercise. BP Response:  Normal blood pressure response. Clinical Symptoms:  Mild chest pain with flushing. ECG Impression:  No significant ST segment change suggestive of ischemia. Comparison with Prior Nuclear Study: Perfusion improved inferiorly; otherwise, no significant change from prior study.  Overall Impression:  Low risk stress nuclear study.  LV Wall Motion:  NL LV Function,EF 66%; NL Wall Motion   Troy Sine, MD  04/25/2012 6:37 PM

## 2012-04-25 NOTE — Progress Notes (Deleted)
Carotid Duplex Completed. [name] D  

## 2012-04-25 NOTE — Progress Notes (Signed)
Venous Duplex Lower Ext. Bilateral Completed. [name]

## 2012-04-30 ENCOUNTER — Other Ambulatory Visit: Payer: Self-pay

## 2012-04-30 MED ORDER — LISINOPRIL 10 MG PO TABS: 10.0000 mg | ORAL_TABLET | Freq: Every day | ORAL | Status: DC

## 2012-05-13 DIAGNOSIS — H353290 Exudative age-related macular degeneration, unspecified eye, stage unspecified: Secondary | ICD-10-CM | POA: Diagnosis not present

## 2012-05-13 DIAGNOSIS — H35059 Retinal neovascularization, unspecified, unspecified eye: Secondary | ICD-10-CM | POA: Diagnosis not present

## 2012-05-14 ENCOUNTER — Other Ambulatory Visit: Payer: Self-pay | Admitting: *Deleted

## 2012-05-14 MED ORDER — ROSUVASTATIN CALCIUM 40 MG PO TABS: 40.0000 mg | ORAL_TABLET | Freq: Every day | ORAL | Status: DC

## 2012-05-21 ENCOUNTER — Other Ambulatory Visit: Payer: Self-pay | Admitting: Family Medicine

## 2012-05-23 ENCOUNTER — Ambulatory Visit: Payer: Self-pay | Admitting: Family Medicine

## 2012-06-18 ENCOUNTER — Other Ambulatory Visit: Payer: Self-pay | Admitting: Family Medicine

## 2012-06-28 ENCOUNTER — Telehealth: Payer: Self-pay | Admitting: Cardiovascular Disease

## 2012-06-28 NOTE — Telephone Encounter (Signed)
Returned call.  Pt stated she has been trying to get her heart medication and none of the pharmacies around there have it.  Stated she was told the medicine is on backorder for 2 months.  Pt stated she was told Dr. Rollene Fare is in the [facility] [place] office today.  Pt informed he is and that he and JC will be notified for further instructions.  Pt is out of medication.  Paper chart# [id number] on triage cart.

## 2012-06-28 NOTE — Telephone Encounter (Signed)
No pharmacy around here have Norpace CR-What can she do? She need this medicine-the generic she can not take!

## 2012-06-28 NOTE — Telephone Encounter (Signed)
Pt states still waiting for call back.

## 2012-07-02 NOTE — Telephone Encounter (Signed)
Per JC, LPN, fax back to pharmacy.  Call to pt and informed.  Pt stated she has the medicine and it's the one she was on before.  Pt stated she has been taking it for 4 days and it makes her "swimmy-headed" and she wants to lie down all the time.  Also stated she has been having some palpitations since being on the new medicine.  Pt would like Dr. Rollene Fare to be aware of this and informed he will be notified.  Message forwarded to [facility]. Tressie Stalker, LPN to discuss w/ Dr. Rollene Fare.  This note and paper chart# [id number] to be placed on Dr. Lowella Fairy cart.

## 2012-07-18 ENCOUNTER — Other Ambulatory Visit: Payer: Self-pay | Admitting: *Deleted

## 2012-07-18 DIAGNOSIS — I472 Ventricular tachycardia: Secondary | ICD-10-CM | POA: Diagnosis not present

## 2012-07-18 DIAGNOSIS — E782 Mixed hyperlipidemia: Secondary | ICD-10-CM | POA: Diagnosis not present

## 2012-07-18 DIAGNOSIS — I2510 Atherosclerotic heart disease of native coronary artery without angina pectoris: Secondary | ICD-10-CM | POA: Diagnosis not present

## 2012-07-18 DIAGNOSIS — I739 Peripheral vascular disease, unspecified: Secondary | ICD-10-CM

## 2012-07-22 ENCOUNTER — Other Ambulatory Visit (HOSPITAL_COMMUNITY): Payer: Self-pay | Admitting: Cardiology

## 2012-07-22 NOTE — Telephone Encounter (Signed)
Rx was sent to pharmacy electronically. 

## 2012-07-31 ENCOUNTER — Ambulatory Visit (INDEPENDENT_AMBULATORY_CARE_PROVIDER_SITE_OTHER): Payer: Medicare Other | Admitting: Family Medicine

## 2012-07-31 ENCOUNTER — Encounter: Payer: Self-pay | Admitting: Family Medicine

## 2012-07-31 VITALS — BP 126/76 | HR 58 | Temp 97.1°F | Ht 65.0 in | Wt 177.8 lb

## 2012-07-31 DIAGNOSIS — I10 Essential (primary) hypertension: Secondary | ICD-10-CM

## 2012-07-31 DIAGNOSIS — E559 Vitamin D deficiency, unspecified: Secondary | ICD-10-CM

## 2012-07-31 DIAGNOSIS — K219 Gastro-esophageal reflux disease without esophagitis: Secondary | ICD-10-CM | POA: Diagnosis not present

## 2012-07-31 DIAGNOSIS — I2510 Atherosclerotic heart disease of native coronary artery without angina pectoris: Secondary | ICD-10-CM | POA: Diagnosis not present

## 2012-07-31 DIAGNOSIS — K5289 Other specified noninfective gastroenteritis and colitis: Secondary | ICD-10-CM

## 2012-07-31 DIAGNOSIS — R5381 Other malaise: Secondary | ICD-10-CM

## 2012-07-31 DIAGNOSIS — I82409 Acute embolism and thrombosis of unspecified deep veins of unspecified lower extremity: Secondary | ICD-10-CM

## 2012-07-31 DIAGNOSIS — I7090 Unspecified atherosclerosis: Secondary | ICD-10-CM | POA: Diagnosis not present

## 2012-07-31 DIAGNOSIS — R5383 Other fatigue: Secondary | ICD-10-CM

## 2012-07-31 DIAGNOSIS — E785 Hyperlipidemia, unspecified: Secondary | ICD-10-CM | POA: Diagnosis not present

## 2012-07-31 DIAGNOSIS — H6123 Impacted cerumen, bilateral: Secondary | ICD-10-CM

## 2012-07-31 DIAGNOSIS — H6120 Impacted cerumen, unspecified ear: Secondary | ICD-10-CM

## 2012-07-31 DIAGNOSIS — I951 Orthostatic hypotension: Secondary | ICD-10-CM

## 2012-07-31 LAB — POCT CBC
HCT, POC: 39.2 % (ref 37.7–47.9)
Hemoglobin: 14 g/dL (ref 12.2–16.2)
MCH, POC: 33.1 pg — AB (ref 27–31.2)
MPV: 7.3 fL (ref 0–99.8)
RBC: 4.2 M/uL (ref 4.04–5.48)
WBC: 5.4 10*3/uL (ref 4.6–10.2)

## 2012-07-31 LAB — BASIC METABOLIC PANEL WITH GFR
CO2: 26 mEq/L (ref 19–32)
Chloride: 104 mEq/L (ref 96–112)
Sodium: 139 mEq/L (ref 135–145)

## 2012-07-31 LAB — HEPATIC FUNCTION PANEL
Albumin: 4 g/dL (ref 3.5–5.2)
Total Bilirubin: 0.7 mg/dL (ref 0.3–1.2)

## 2012-07-31 NOTE — Patient Instructions (Addendum)
Fall precautions discussed Continue current meds and therapeutic lifestyle changes Will schedule appt for mammogram

## 2012-07-31 NOTE — Progress Notes (Signed)
Subjective:    Patient ID: [name], female    DOB: January 08, 1932, 79 y.o.   MRN: [id number]  HPI Patient returns to clinic today for followup of chronic medical problems. These include ASCVD, hypertension, hyperlipidemia, GERD, and vitamin D deficiency. Also see the review of systems . Her health maintenance indicates that she needs to have her mammogram . She has a Investment banker, corporate and is followed by her cardiologist Dr. Rollene Fare. She last saw him 2 weeks ago and she will see him about every 4 months. We will arrange for the patient to see a GYN physician next-door to do her pelvic exam. She has a history of postmenopausal bleeding and this was evaluated by Dr. Matthew Saras in May of 2012.   Review of Systems  Constitutional: Positive for fatigue.  HENT: Positive for congestion (slight due to allergies) and postnasal drip. Negative for ear pain and sore throat.   Eyes: Positive for visual disturbance (film, macular degeneration).  Respiratory: Positive for cough (slightly prod, clear). Negative for shortness of breath.   Cardiovascular: Positive for palpitations (occasional, defibulator) and leg swelling (slight). Negative for chest pain.  Gastrointestinal: Positive for nausea (slight), abdominal pain (occasional) and diarrhea (daily).  Endocrine: Negative.   Genitourinary: Negative.   Musculoskeletal: Positive for back pain (LBP, constant) and arthralgias (L>R hip).  Skin: Positive for rash (dry skin).  Allergic/Immunologic: Positive for environmental allergies.  Neurological: Positive for dizziness (intermitent), weakness and numbness (L lower leg). Negative for headaches.  Hematological: Negative.   Psychiatric/Behavioral: Negative for sleep disturbance. The patient is nervous/anxious (some depression).        Objective:   Physical Exam BP 126/76  Pulse 58  Temp(Src) 97.1 F (36.2 C) (Oral)  Ht 5\' 5"  (1.651 m)  Wt 177 lb 12.8 oz (80.65 kg)  BMI 29.59 kg/m2  The  patient appeared well nourished and normally developed, alert and oriented to time and place. Speech, behavior and judgement appear normal. Vital signs as documented.  Head exam is unremarkable. No scleral icterus or pallor noted. She has an occluded right ear canal. Her mouth and throat were normal. Neck is without jugular venous distension, thyromegally, or carotid bruits. Carotid upstrokes are brisk bilaterally. No cervical adenopathy. Lungs are clear anteriorly and posteriorly to auscultation. Normal respiratory effort. Cardiac exam reveals regular rate and rhythm at 72 per minute. First and second heart sounds normal.  No murmurs, rubs or gallops. Breasts were checked today and no masses or lumps were palpated.  Abdominal exam reveals obesity, normal bowl sounds, no masses, no organomegaly and no aortic enlargement. No inguinal adenopathy. There is generalized tenderness. Extremities are nonedematous and both femoral and pedal pulses are normal. There are varicose veins in both lower extremities . Skin without pallor or jaundice.  Warm and dry, without rash. Neurologic exam reveals  diminished deep tendon reflexes and normal sensation.          Assessment & Plan:  1. Hypertension - BASIC METABOLIC PANEL WITH GFR; Standing - BASIC METABOLIC PANEL WITH GFR  2. Hyperlipemia - NMR Lipoprofile with Lipids; Standing - Hepatic function panel; Standing - NMR Lipoprofile with Lipids - Hepatic function panel  3. GERD (gastroesophageal reflux disease)  4. ASCVD (arteriosclerotic cardiovascular disease)  5. Vitamin D deficiency - Vitamin D 25 hydroxy; Standing - Vitamin D 25 hydroxy  6. Fatigue - POCT CBC; Standing - POCT CBC  7. COLITIS -This is stable  8. DVT (deep venous thrombosis), unspecified laterality   9. Orthostatic hypotension  10. Impacted cerumen of right  ear -Ear was irrigated  Patient Instructions  Fall precautions discussed Continue current meds and  therapeutic lifestyle changes Will schedule appt for mammogram   Arrie Senate MD

## 2012-08-01 LAB — NMR LIPOPROFILE WITH LIPIDS
HDL Size: 9.1 nm — ABNORMAL LOW (ref >=9.2)
HDL-C: 43 mg/dL (ref >=40)
Large HDL-P: 6.5 umol/L (ref >=4.8)
Triglycerides: 161 mg/dL — ABNORMAL HIGH (ref <150)
VLDL Size: 51.8 nm — ABNORMAL HIGH (ref <=46.6)

## 2012-08-01 LAB — VITAMIN D 25 HYDROXY (VIT D DEFICIENCY, FRACTURES): Vit D, 25-Hydroxy: 45 ng/mL (ref 30–89)

## 2012-08-02 ENCOUNTER — Inpatient Hospital Stay (HOSPITAL_COMMUNITY): Admission: RE | Admit: 2012-08-02 | Payer: Medicare Other | Source: Ambulatory Visit

## 2012-08-03 ENCOUNTER — Encounter: Payer: Self-pay | Admitting: Cardiovascular Disease

## 2012-08-12 DIAGNOSIS — H353290 Exudative age-related macular degeneration, unspecified eye, stage unspecified: Secondary | ICD-10-CM | POA: Diagnosis not present

## 2012-08-12 DIAGNOSIS — H35059 Retinal neovascularization, unspecified, unspecified eye: Secondary | ICD-10-CM | POA: Diagnosis not present

## 2012-08-13 ENCOUNTER — Telehealth: Payer: Self-pay | Admitting: Family Medicine

## 2012-08-15 ENCOUNTER — Telehealth: Payer: Self-pay | Admitting: *Deleted

## 2012-08-15 DIAGNOSIS — Z01419 Encounter for gynecological examination (general) (routine) without abnormal findings: Secondary | ICD-10-CM

## 2012-08-15 NOTE — Telephone Encounter (Signed)
Pt notified of results

## 2012-08-16 ENCOUNTER — Telehealth (HOSPITAL_COMMUNITY): Payer: Self-pay | Admitting: Cardiovascular Disease

## 2012-08-19 ENCOUNTER — Other Ambulatory Visit (HOSPITAL_COMMUNITY): Payer: Self-pay | Admitting: Cardiology

## 2012-08-19 NOTE — Telephone Encounter (Signed)
Rx was sent to pharmacy electronically via [employer].

## 2012-08-28 ENCOUNTER — Other Ambulatory Visit: Payer: Self-pay

## 2012-08-28 DIAGNOSIS — Z1231 Encounter for screening mammogram for malignant neoplasm of breast: Secondary | ICD-10-CM

## 2012-08-29 ENCOUNTER — Telehealth: Payer: Self-pay | Admitting: Cardiovascular Disease

## 2012-08-29 MED ORDER — PANTOPRAZOLE SODIUM 40 MG PO TBEC: 40.0000 mg | DELAYED_RELEASE_TABLET | Freq: Every day | ORAL | Status: DC

## 2012-08-29 NOTE — Telephone Encounter (Signed)
Need refill on her Protonix 40 mg #30

## 2012-08-29 NOTE — Telephone Encounter (Signed)
Refill(s) sent to pharmacy via [employer].

## 2012-09-03 ENCOUNTER — Ambulatory Visit: Payer: Medicare Other | Admitting: Obstetrics & Gynecology

## 2012-09-18 ENCOUNTER — Ambulatory Visit: Payer: Medicare Other

## 2012-09-20 ENCOUNTER — Other Ambulatory Visit: Payer: Self-pay | Admitting: Family Medicine

## 2012-10-04 ENCOUNTER — Ambulatory Visit
Admission: RE | Admit: 2012-10-04 | Discharge: 2012-10-04 | Disposition: A | Discharge status: Home / Self Care | Payer: Self-pay | Source: Ambulatory Visit

## 2012-10-04 DIAGNOSIS — Z1231 Encounter for screening mammogram for malignant neoplasm of breast: Secondary | ICD-10-CM

## 2012-11-12 ENCOUNTER — Ambulatory Visit (INDEPENDENT_AMBULATORY_CARE_PROVIDER_SITE_OTHER): Payer: Medicare Other | Admitting: Cardiovascular Disease

## 2012-11-12 ENCOUNTER — Encounter: Payer: Self-pay | Admitting: Cardiovascular Disease

## 2012-11-12 VITALS — BP 136/64 | HR 60 | Ht 65.0 in | Wt 183.1 lb

## 2012-11-12 DIAGNOSIS — I259 Chronic ischemic heart disease, unspecified: Secondary | ICD-10-CM | POA: Diagnosis not present

## 2012-11-12 DIAGNOSIS — I495 Sick sinus syndrome: Secondary | ICD-10-CM

## 2012-11-12 DIAGNOSIS — R001 Bradycardia, unspecified: Secondary | ICD-10-CM

## 2012-11-12 DIAGNOSIS — E7800 Pure hypercholesterolemia, unspecified: Secondary | ICD-10-CM | POA: Diagnosis not present

## 2012-11-12 DIAGNOSIS — Z95810 Presence of automatic (implantable) cardiac defibrillator: Secondary | ICD-10-CM

## 2012-11-12 DIAGNOSIS — N183 Chronic kidney disease, stage 3 unspecified: Secondary | ICD-10-CM

## 2012-11-12 LAB — ICD DEVICE OBSERVATION
DEV-0020ICD: NEGATIVE
DEVICE MODEL ICD: 133463
PACEART VT: 0
RV LEAD THRESHOLD: 0.8 V
TZAT-0001SLOWVT: 1
TZAT-0004SLOWVT: 8
TZAT-0004SLOWVT: 8
TZAT-0018SLOWVT: NEGATIVE
TZAT-0018SLOWVT: NEGATIVE
TZON-0003VSLOWVT: 428.5 ms
TZON-0005SLOWVT: 1
TZST-0001SLOWVT: 7
TZST-0003SLOWVT: 31 J
TZST-0003SLOWVT: 31 J
TZST-0003SLOWVT: 31 J
TZST-0003SLOWVT: 31 J
VENTRICULAR PACING ICD: 0 pct

## 2012-11-12 NOTE — Patient Instructions (Signed)
Your physician recommends that you schedule a follow-up appointment in: 3 months.  

## 2012-11-18 ENCOUNTER — Ambulatory Visit: Payer: Medicare Other | Admitting: *Deleted

## 2012-11-18 ENCOUNTER — Other Ambulatory Visit (INDEPENDENT_AMBULATORY_CARE_PROVIDER_SITE_OTHER): Payer: Medicare Other | Admitting: *Deleted

## 2012-11-18 DIAGNOSIS — Z23 Encounter for immunization: Secondary | ICD-10-CM | POA: Diagnosis not present

## 2012-11-18 NOTE — Progress Notes (Signed)
PREVNAR AND FLUARIX GIVEN PT TOLERATED WELL

## 2012-11-19 ENCOUNTER — Other Ambulatory Visit: Payer: Self-pay | Admitting: Family Medicine

## 2012-11-24 NOTE — Assessment & Plan Note (Signed)
Asymptomatic, no lesions and widely patent stents by cath in 2013, normal nuclear perfusion study April 2014. Preserved left ventricular systolic function.

## 2012-11-24 NOTE — Progress Notes (Signed)
Patient ID: [name], female   DOB: Aug 27, 1931, 79 y.o.   MRN: [id number]     Reason for office visit Ventricular tachycardia, defibrillator followup  This is my first encounter with [name] who was previously cared for by Dr. Terance Ice. She has a history of adrenergic Lee mediated polymorphic ventricular tachycardia and received a defibrillator in 1993. Her last generator change as performed in 2009 when she received a new endocardial lead. Her arrhythmia problems predates coronary disease. She underwent stents to the LAD (2003, [place]) and ramus intermedius (1999, bare metal), with patent stents by cardiac catheterization in 2006 and 2013. Her perfusion study was normal in April of 2014  Despite the presence of coronary disease she has been receiving chronic therapy with disopyramide or the ventricular tachycardia and this has been well-tolerated for 20 years. She also takes beta blockers, statin and aspirin. Ventricular ejection fraction is normal and she does not have any significant valvular abnormalities  She has no true cardiac complaints, but  does have occasional brief palpitations and fatigue. No meaningful arrhythmia has been recorded by her defibrillator since a year ago.  Has a remote history of deep venous thrombosis of the left subclavian vein related to her previous device procedures. She also has varicose veins in the lower extremities, right greater than left.    Allergies  Allergen Reactions  . Contrast Media [Iodinated Diagnostic Agents] Other (See Comments)    headache  . Protonix [Pantoprazole Sodium] Other (See Comments)    UNSPECIFIED   . Actos [Pioglitazone] Swelling  . Lipitor [Atorvastatin] Other (See Comments)    myalgia    Current Outpatient Prescriptions  Medication Sig Dispense Refill  . aspirin EC 81 MG tablet Take 81 mg by mouth daily.      . Calcium Citrate-Vitamin D (CITRACAL + D PO) Take 1 tablet by mouth daily.      .  Cholecalciferol (VITAMIN D) 1000 UNITS capsule Take 2,000 Units by mouth daily.       Marland Kitchen lisinopril (PRINIVIL,ZESTRIL) 10 MG tablet TAKE  (1)  TABLET TWICE A DAY AS DIRECTED.  60 tablet  5  . metoprolol tartrate (LOPRESSOR) 25 MG tablet TAKE (1/2) TABLET TWICE DAILY.  30 tablet  11  . Multiple Vitamins-Minerals (CENTRUM SILVER PO) Take 1 tablet by mouth daily.      . NORPACE CR 100 MG 12 hr capsule TAKE (2) CAPSULES TWICE DAILY.  120 capsule  6  . pantoprazole (PROTONIX) 40 MG tablet Take 1 tablet (40 mg total) by mouth daily.  30 tablet  10  . PARoxetine (PAXIL) 20 MG tablet Take 20 mg by mouth every morning.        . rosuvastatin (CRESTOR) 40 MG tablet Take 20 mg by mouth daily.       Marland Kitchen torsemide (DEMADEX) 20 MG tablet TAKE 1 TABLET DAILY  30 tablet  2   No current facility-administered medications for this visit.    Past Medical History  Diagnosis Date  . ICD (implantable cardiac defibrillator) in place 46    pt states history of atrial fib/vent tachy-cardiac arrest 93 when icd placed. preop prescription for implantable device faxed to [facility]  . Hypertension     EKG done 1 week ago [facility]-requested  . GERD (gastroesophageal reflux disease)     omeprazole  . Dysrhythmia   . Diverticulosis of colon (without mention of hemorrhage) 2007    Colonoscopy   . Myocardial infarction     history of  cardiac arrest 93-stents, on plavix-d/c 5 days prior to surger  . Shortness of breath   . H/O hiatal hernia   . Orthostatic hypotension 07/27/2011  . Chronic sinus bradycardia 07/25/2011  . CAD, s/p LAD and Dx stenting in 2003- patent 2006, Myoview low risk 2010. NL LVF 2D 10/12 12/03/2007    Qualifier: Diagnosis of  By: Sharlett Iles MD Byrd Hesselbach   . Chronic renal insufficiency, stage III (moderate), Scr 1.5 in the past 07/25/2011  . History of DVT in the past, not on Coumadin now 07/25/2011  . HYPERCHOLESTEROLEMIA 12/02/2007    Qualifier: Diagnosis of  By: Nils Pyle CMA (AAMA), [name]    .  Nephrolithiasis, just saw Dr [name] 07/25/2011  . EXTERNAL HEMORRHOIDS 12/02/2007    Qualifier: Diagnosis of  By: Nils Pyle CMA ([place]), [name]    . ARTHRITIS 12/02/2007    Qualifier: Diagnosis of  By: Nils Pyle CMA ([place]), [name]    . Esophagitis, unspecified 2003    EGD  . Unspecified gastritis and gastroduodenitis without mention of hemorrhage 2003    EGD    Past Surgical History  Procedure Laterality Date  . Icd placement  1993  . Hemorroidectomy  80's  . Hysteroscopy w/d&c  09/02/2010    Procedure: DILATATION AND CURETTAGE (D&C) /HYSTEROSCOPY;  Surgeon: Margarette Asal;  Location: [facility];  Service: Gynecology;  Laterality: N/A;  Dilation and Curettage with Hysteroscopy and Polypectomy    No family history on file.  History   Social History  . Marital Status: Widowed    Spouse Name: N/A    Number of Children: N/A  . Years of Education: N/A   Occupational History  . Not on file.   Social History Main Topics  . Smoking status: Never Smoker   . Smokeless tobacco: Never Used  . Alcohol Use: No  . Drug Use: No  . Sexual Activity: No   Other Topics Concern  . Not on file   Social History Narrative  . No narrative on file    Review of systems: The patient specifically denies any chest pain at rest exertion, dyspnea at rest or with exertion, orthopnea, paroxysmal nocturnal dyspnea, syncope, palpitations, focal neurological deficits, intermittent claudication, lower extremity edema, unexplained weight gain, cough, hemoptysis or wheezing.   PHYSICAL EXAM BP 136/64  Pulse 60  Ht 5\' 5"  (1.651 m)  Wt 183 lb 1.6 oz (83.054 kg)  BMI 30.47 kg/m2 General: Alert, oriented x3, no distress Head: no evidence of trauma, PERRL, EOMI, no exophtalmos or lid lag, no myxedema, no xanthelasma; normal ears, nose and oropharynx Neck: normal jugular venous pulsations and no hepatojugular reflux; brisk carotid pulses without delay and no carotid bruits Chest: clear to auscultation, no  signs of consolidation by percussion or palpation, normal fremitus, symmetrical and full respiratory excursions; and right subclavian scar is, healthy defibrillator site in the right subclavian area Cardiovascular: normal position and quality of the apical impulse, regular rhythm, normal first and second heart sounds, no murmurs, rubs or gallops Abdomen: no tenderness or distention, no masses by palpation, no abnormal pulsatility or arterial bruits, normal bowel sounds, no hepatosplenomegaly Extremities: no clubbing, cyanosis or edema; I. Lateral mild stasis dermatitis;varicose veins more obvious on the right than on the left; 2+ radial, ulnar and brachial pulses bilaterally; 2+ right femoral, posterior tibial and dorsalis pedis pulses; 2+ left femoral, posterior tibial and dorsalis pedis pulses; no subclavian or femoral bruits Neurological: grossly nonfocal  EKG: Sinus rhythm with first degree AV block, nonspecific intraventricular  conduction delay with left axis deviation an appearance most consistent with a left bundle branch block he appearance is unchanged from earlier this year and she has had a broad QRS at least for the last couple of years.   Lipid Panel     Component Value Date/Time   CHOL 142 09/02/2010 0629   TRIG 161* 07/31/2012 1019   TRIG 169* 09/02/2010 0629   HDL 50 09/02/2010 0629   CHOLHDL 2.8 09/02/2010 0629   VLDL 34 09/02/2010 0629   LDLCALC 65 07/31/2012 1019   LDLCALC 58 09/02/2010 0629    BMET    Component Value Date/Time   NA 139 07/31/2012 1019   K 4.8 07/31/2012 1019   CL 104 07/31/2012 1019   CO2 26 07/31/2012 1019   GLUCOSE 93 07/31/2012 1019   BUN 17 07/31/2012 1019   CREATININE 1.52* 07/31/2012 1019   CREATININE 1.36* 07/29/2011 0940   CALCIUM 9.6 07/31/2012 1019   GFRNONAA 36* 07/29/2011 0940   GFRAA 42* 07/29/2011 0940     ASSESSMENT AND PLAN ICD (implantable cardiac defibrillator) in place, VT in 1993, multiple device changes, last in 2009 Her defibrillator  was implanted for polymorphic VT in 1993. Her initial device was abdominal. In 2003 the device was moved to the left chest and chin underwent a generator change in 2007. The epicardial pace sense lead failed and she had a new right-sided device implanted in 2009. As far as I know she has not received shocks from his device. Comprehensive an office check shows normal device function. She has had a few nonsustained ventricular tachycardia been several spontaneously resolved. Last event occurred a year ago on 12/12/2011. There are no electrograms available. She does not pace the ventricle more than 1% of the time. I don't think there are any changes made to the device that could explain the way she feels. I think her fatigue and listlessness are related to her emotional distress.   CAD, s/p LAD and Dx stenting in 2003- patent 2006 and 07/28/11. Residual 80% Dx1 Asymptomatic, no lesions and widely patent stents by cath in 2013, normal nuclear perfusion study April 2014. Preserved left ventricular systolic function.  HYPERCHOLESTEROLEMIA Borderline elevated triglycerides otherwise satisfactory lipid profile  Chronic renal insufficiency, stage III (moderate), Scr 1.5 in the past     Orders Placed This Encounter  Procedures  . ICD Device Observation  . EKG 12-Lead   Patient Instructions  Your physician recommends that you schedule a follow-up appointment in: 3 months     Kadra Kohan  Sanda Klein, MD, [facility] [phone] office [phone] pager

## 2012-11-24 NOTE — Assessment & Plan Note (Signed)
Borderline elevated triglycerides otherwise satisfactory lipid profile

## 2012-11-24 NOTE — Assessment & Plan Note (Signed)
Her defibrillator was implanted for polymorphic VT in 1993. Her initial device was abdominal. In 2003 the device was moved to the left chest and chin underwent a generator change in 2007. The epicardial pace sense lead failed and she had a new right-sided device implanted in 2009. As far as I know she has not received shocks from his device. Comprehensive an office check shows normal device function. She has had a few nonsustained ventricular tachycardia been several spontaneously resolved. Last event occurred a year ago on 12/12/2011. There are no electrograms available. She does not pace the ventricle more than 1% of the time. I don't think there are any changes made to the device that could explain the way she feels. I think her fatigue and listlessness are related to her emotional distress.

## 2012-11-25 ENCOUNTER — Encounter: Payer: Self-pay | Admitting: Cardiovascular Disease

## 2012-12-02 DIAGNOSIS — H35369 Drusen (degenerative) of macula, unspecified eye: Secondary | ICD-10-CM | POA: Diagnosis not present

## 2012-12-02 DIAGNOSIS — H35059 Retinal neovascularization, unspecified, unspecified eye: Secondary | ICD-10-CM | POA: Diagnosis not present

## 2012-12-02 DIAGNOSIS — H353190 Nonexudative age-related macular degeneration, unspecified eye, stage unspecified: Secondary | ICD-10-CM | POA: Diagnosis not present

## 2012-12-09 ENCOUNTER — Telehealth: Payer: Self-pay | Admitting: Internal Medicine

## 2012-12-09 NOTE — Telephone Encounter (Signed)
Pt saw dr C 11-12-12/mt

## 2013-01-06 ENCOUNTER — Ambulatory Visit (INDEPENDENT_AMBULATORY_CARE_PROVIDER_SITE_OTHER): Payer: Medicare Other

## 2013-01-06 ENCOUNTER — Encounter: Payer: Self-pay | Admitting: Family Medicine

## 2013-01-06 ENCOUNTER — Ambulatory Visit (INDEPENDENT_AMBULATORY_CARE_PROVIDER_SITE_OTHER): Payer: Medicare Other | Admitting: Family Medicine

## 2013-01-06 VITALS — BP 140/67 | HR 52 | Temp 98.4°F | Ht 65.0 in | Wt 177.6 lb

## 2013-01-06 DIAGNOSIS — M25569 Pain in unspecified knee: Secondary | ICD-10-CM

## 2013-01-06 DIAGNOSIS — H8309 Labyrinthitis, unspecified ear: Secondary | ICD-10-CM

## 2013-01-06 DIAGNOSIS — M129 Arthropathy, unspecified: Secondary | ICD-10-CM

## 2013-01-06 DIAGNOSIS — I259 Chronic ischemic heart disease, unspecified: Secondary | ICD-10-CM

## 2013-01-06 DIAGNOSIS — M25561 Pain in right knee: Secondary | ICD-10-CM

## 2013-01-06 DIAGNOSIS — E559 Vitamin D deficiency, unspecified: Secondary | ICD-10-CM | POA: Diagnosis not present

## 2013-01-06 DIAGNOSIS — E7800 Pure hypercholesterolemia, unspecified: Secondary | ICD-10-CM | POA: Diagnosis not present

## 2013-01-06 DIAGNOSIS — R001 Bradycardia, unspecified: Secondary | ICD-10-CM

## 2013-01-06 DIAGNOSIS — I495 Sick sinus syndrome: Secondary | ICD-10-CM

## 2013-01-06 DIAGNOSIS — H8303 Labyrinthitis, bilateral: Secondary | ICD-10-CM

## 2013-01-06 LAB — POCT CBC
Lymph, poc: 1.6 (ref 0.6–3.4)
MCH, POC: 33.1 pg — AB (ref 27–31.2)
MCHC: 35.7 g/dL — AB (ref 31.8–35.4)
MCV: 92.9 fL (ref 80–97)
MPV: 7.3 fL (ref 0–99.8)
POC LYMPH PERCENT: 30.2 %L (ref 10–50)
Platelet Count, POC: 207 10*3/uL (ref 142–424)
WBC: 5.4 10*3/uL (ref 4.6–10.2)

## 2013-01-06 MED ORDER — MECLIZINE HCL 12.5 MG PO TABS: 12.5000 mg | ORAL_TABLET | Freq: Three times a day (TID) | ORAL | Status: DC | PRN

## 2013-01-06 NOTE — Patient Instructions (Addendum)
Continue current medications. Continue good therapeutic lifestyle changes which include good diet and exercise. Fall precautions discussed with patient. Schedule your flu vaccine if you haven't had it yet If you are over 77 years old - you may need Prevnar 54 or the adult Pneumonia vaccine. Monitor blood pressures at home on the days that you had the dizziness Take the Antivert as needed for dizziness for 2-3 days Regularly with the cardiologist Check with [place] in early January regarding an appointment with the orthopedist

## 2013-01-06 NOTE — Progress Notes (Signed)
Subjective:    Patient ID: [name], female    DOB: 03-18-1931, 79 y.o.   MRN: [id number]  HPI Pt here for follow up and management of chronic medical problems. Patient complains of lightheadedness worse in the morning. The light headedness seems to be worse with head movements and it usually lasts for 2-3 days . She also complains of generalized arthralgias but especially the right knee and we will try to make an arrangement for her to see the orthopedic surgeon when he makes his visit in January.      Patient Active Problem List   Diagnosis Date Noted  . Hypotension, meds stopped this admission 07/29/2011  . Orthostatic hypotension 07/27/2011  . Chest pain, r/o cardiac, negative MI 07/25/2011  . Fatigue X 3wks, presumably secondary to medications 07/25/2011    Class: Acute  . Chronic sinus bradycardia 07/25/2011  . ICD (implantable cardiac defibrillator) in place, VT in 1993, multiple device changes, last in 2009 07/25/2011  . Chronic renal insufficiency, stage III (moderate), Scr 1.5 in the past 07/25/2011    Class: Chronic  . History of DVT in the past, not on Coumadin now 07/25/2011    Class: History of  . Nephrolithiasis, just saw Dr [name] 07/25/2011    Class: History of  . DYSPHAGIA 03/24/2010  . CAD, s/p LAD and Dx stenting in 2003- patent 2006 and 07/28/11. Residual 80% Dx1 12/03/2007  . RECTAL BLEEDING 12/03/2007  . Diarrhea 12/03/2007  . EPIGASTRIC PAIN 12/03/2007  . HYPERCHOLESTEROLEMIA 12/02/2007    Class: History of  . EXTERNAL HEMORRHOIDS 12/02/2007    Class: History of  . ESOPHAGITIS 12/02/2007  . GASTRITIS 12/02/2007  . COLITIS 12/02/2007  . DIVERTICULOSIS, COLON 12/02/2007  . ARTHRITIS 12/02/2007    Class: Chronic   Outpatient Encounter Prescriptions as of 01/06/2013  Medication Sig  . aspirin EC 81 MG tablet Take 81 mg by mouth daily.  . Calcium Citrate-Vitamin D (CITRACAL + D PO) Take 1 tablet by mouth daily.  . Cholecalciferol (VITAMIN  D) 1000 UNITS capsule Take 2,000 Units by mouth daily.   Marland Kitchen lisinopril (PRINIVIL,ZESTRIL) 10 MG tablet TAKE  (1)  TABLET TWICE A DAY AS DIRECTED.  Marland Kitchen metoprolol tartrate (LOPRESSOR) 25 MG tablet TAKE (1/2) TABLET TWICE DAILY.  . Multiple Vitamins-Minerals (CENTRUM SILVER PO) Take 1 tablet by mouth daily.  . NORPACE CR 100 MG 12 hr capsule TAKE (2) CAPSULES TWICE DAILY.  . pantoprazole (PROTONIX) 40 MG tablet Take 1 tablet (40 mg total) by mouth daily.  Marland Kitchen PARoxetine (PAXIL) 20 MG tablet Take 20 mg by mouth every morning.    . rosuvastatin (CRESTOR) 40 MG tablet Take 20 mg by mouth daily.   Marland Kitchen torsemide (DEMADEX) 20 MG tablet TAKE 1 TABLET DAILY    Review of Systems  Constitutional: Negative.   HENT: Negative.   Eyes: Negative.   Respiratory: Negative.   Cardiovascular: Negative.   Gastrointestinal: Negative.   Endocrine: Negative.   Genitourinary: Negative.   Musculoskeletal: Positive for arthralgias (right knee pain and swelling).  Skin: Negative.   Allergic/Immunologic: Negative.   Neurological: Positive for light-headedness (worse when waking in the am).  Hematological: Negative.   Psychiatric/Behavioral: Negative.        Objective:   Physical Exam  Nursing note and vitals reviewed. Constitutional: She is oriented to person, place, and time. She appears well-developed and well-nourished. No distress.  Doing well for her age.  HENT:  Head: Normocephalic and atraumatic.  Right Ear: External ear  normal.  Left Ear: External ear normal.  Nose: Nose normal.  Mouth/Throat: Oropharynx is clear and moist. No oropharyngeal exudate.  Eyes: Conjunctivae and EOM are normal. Pupils are equal, round, and reactive to light. Right eye exhibits no discharge. Left eye exhibits no discharge. No scleral icterus.  Neck: Normal range of motion. Neck supple. No JVD present. No thyromegaly present.  Without bruits  Cardiovascular: Normal rate, regular rhythm, normal heart sounds and intact distal  pulses.  Exam reveals no gallop and no friction rub.   No murmur heard. At 72 per minute  Pulmonary/Chest: Effort normal and breath sounds normal. No respiratory distress. She has no wheezes. She has no rales. She exhibits no tenderness.  Abdominal: Soft. Bowel sounds are normal. She exhibits no mass. There is no tenderness. There is no rebound and no guarding.  Musculoskeletal: Normal range of motion. She exhibits no edema and no tenderness.  Tender with movement of right knee and tender at bilateral joint lines of right knee  Lymphadenopathy:    She has no cervical adenopathy.  Neurological: She is alert and oriented to person, place, and time. She has normal reflexes. No cranial nerve deficit.  Skin: Skin is warm and dry.  Psychiatric: She has a normal mood and affect. Her behavior is normal. Judgment and thought content normal.   BP 140/67  Pulse 52  Temp(Src) 98.4 F (36.9 C) (Oral)  Ht 5\' 5"  (1.651 m)  Wt 177 lb 9.6 oz (80.559 kg)  BMI 29.55 kg/m2  [facility] reading (PRIMARY) by  Dr. Laurance Flatten- right knee--joint space narrowing, degenerative changes and Baker's cyst                                        Assessment & Plan:  1. ARTHRITIS - POCT CBC - DG Bone Density; Future - DG Knee Complete 4 Views Right; Future  2. CAD, s/p LAD and Dx stenting in 2003- patent 2006 and 07/28/11. Residual 80% Dx1 - POCT CBC - Hepatic function panel - BMP8+EGFR  3. HYPERCHOLESTEROLEMIA - POCT CBC - NMR, lipoprofile  4. Vitamin D deficiency - Vit D  25 hydroxy (rtn osteoporosis monitoring)  5. Chronic sinus bradycardia  6. Chronic renal insufficiency, stage III (moderate), Scr 1.5 in the past  7. Labyrinthitis, bilateral - meclizine (ANTIVERT) 12.5 MG tablet; Take 1 tablet (12.5 mg total) by mouth 3 (three) times daily as needed for dizziness.  Dispense: 30 tablet; Refill: 0  8. Right knee pain - DG Knee Complete 4 Views Right; Future  Orders Placed This Encounter  Procedures  . DG  Bone Density    Standing Status: Future     Number of Occurrences:      Standing Expiration Date: 03/09/2014    Order Specific Question:  Reason for Exam (SYMPTOM  OR DIAGNOSIS REQUIRED)    Answer:  postmenopausal    Order Specific Question:  Preferred imaging location?    Answer:  Internal  . DG Knee Complete 4 Views Right    Standing Status: Future     Number of Occurrences: 1     Standing Expiration Date: 03/09/2014    Order Specific Question:  Reason for Exam (SYMPTOM  OR DIAGNOSIS REQUIRED)    Answer:  Right knee pain    Order Specific Question:  Preferred imaging location?    Answer:  Internal  . Hepatic function panel  . BMP8+EGFR  .  NMR, lipoprofile  . Vit D  25 hydroxy (rtn osteoporosis monitoring)  . POCT CBC   Meds ordered this encounter  Medications  . meclizine (ANTIVERT) 12.5 MG tablet    Sig: Take 1 tablet (12.5 mg total) by mouth 3 (three) times daily as needed for dizziness.    Dispense:  30 tablet    Refill:  0   Patient Instructions  Continue current medications. Continue good therapeutic lifestyle changes which include good diet and exercise. Fall precautions discussed with patient. Schedule your flu vaccine if you haven't had it yet If you are over 64 years old - you may need Prevnar 61 or the adult Pneumonia vaccine. Monitor blood pressures at home on the days that you had the dizziness Take the Antivert as needed for dizziness for 2-3 days Regularly with the cardiologist Check with [place] in early January regarding an appointment with the orthopedist   Arrie Senate MD

## 2013-01-07 ENCOUNTER — Telehealth: Payer: Self-pay

## 2013-01-07 NOTE — Telephone Encounter (Signed)
Pt aware of knee xray results

## 2013-01-07 NOTE — Telephone Encounter (Signed)
Message copied by Koren Bound on Tue Jan 07, 2013  4:49 PM ------      Message from: Chipper Herb      Created: Mon Jan 06, 2013  7:55 PM       As per radiology report--------- please make sure that we arrange for this patient to see Dr. Alvan Dame when he is here the next time ------

## 2013-01-08 LAB — NMR, LIPOPROFILE
HDL Cholesterol by NMR: 51 mg/dL (ref >=40)
LDL Particle Number: 1185 nmol/L — ABNORMAL HIGH (ref <1000)
LDL Size: 20.5 nm — ABNORMAL LOW (ref >20.5)
Small LDL Particle Number: 822 nmol/L — ABNORMAL HIGH (ref <=527)
Triglycerides by NMR: 174 mg/dL — ABNORMAL HIGH (ref <150)

## 2013-01-08 LAB — BMP8+EGFR
CO2: 24 mmol/L (ref 18–29)
Calcium: 9.3 mg/dL (ref 8.6–10.2)
Creatinine, Ser: 1.14 mg/dL — ABNORMAL HIGH (ref 0.57–1.00)
GFR calc non Af Amer: 46 mL/min/{1.73_m2} — ABNORMAL LOW (ref >59)
Glucose: 83 mg/dL (ref 65–99)
Potassium: 4.2 mmol/L (ref 3.5–5.2)

## 2013-01-08 LAB — HEPATIC FUNCTION PANEL
ALT: 15 IU/L (ref 0–32)
AST: 17 IU/L (ref 0–40)

## 2013-01-08 LAB — VITAMIN D 25 HYDROXY (VIT D DEFICIENCY, FRACTURES): Vit D, 25-Hydroxy: 30 ng/mL (ref 30.0–100.0)

## 2013-01-13 ENCOUNTER — Other Ambulatory Visit: Payer: Self-pay | Admitting: Family Medicine

## 2013-01-15 ENCOUNTER — Telehealth: Payer: Self-pay | Admitting: Family Medicine

## 2013-01-15 NOTE — Telephone Encounter (Signed)
Discussed results and recommendations with patient.   

## 2013-01-29 ENCOUNTER — Ambulatory Visit: Payer: Self-pay

## 2013-01-29 ENCOUNTER — Other Ambulatory Visit: Payer: Medicare Other

## 2013-02-05 DIAGNOSIS — M5137 Other intervertebral disc degeneration, lumbosacral region: Secondary | ICD-10-CM | POA: Diagnosis not present

## 2013-02-05 DIAGNOSIS — M4120 Other idiopathic scoliosis, site unspecified: Secondary | ICD-10-CM | POA: Diagnosis not present

## 2013-02-05 DIAGNOSIS — M25569 Pain in unspecified knee: Secondary | ICD-10-CM | POA: Diagnosis not present

## 2013-02-06 ENCOUNTER — Encounter: Payer: Self-pay | Admitting: *Deleted

## 2013-02-10 DIAGNOSIS — H2510 Age-related nuclear cataract, unspecified eye: Secondary | ICD-10-CM | POA: Diagnosis not present

## 2013-02-10 DIAGNOSIS — H353290 Exudative age-related macular degeneration, unspecified eye, stage unspecified: Secondary | ICD-10-CM | POA: Diagnosis not present

## 2013-02-10 DIAGNOSIS — H3560 Retinal hemorrhage, unspecified eye: Secondary | ICD-10-CM | POA: Diagnosis not present

## 2013-02-11 ENCOUNTER — Ambulatory Visit: Payer: Medicare Other | Admitting: Cardiovascular Disease

## 2013-02-19 ENCOUNTER — Other Ambulatory Visit: Payer: Medicare Other

## 2013-02-19 ENCOUNTER — Ambulatory Visit: Payer: Medicare Other

## 2013-02-20 ENCOUNTER — Telehealth: Payer: Self-pay | Admitting: Family Medicine

## 2013-02-21 NOTE — Telephone Encounter (Signed)
Patient states that she has a place on her upper leg that if you touch it it feels like a burn but there are no visible marks. No bruising. Advised patient that she NTBS. Agreed to come in the AM but I advised that if it becomes painful or numb or changes color it could possibly be a blockage or blood clot and she should go to nearest ER or call 911

## 2013-02-22 ENCOUNTER — Encounter: Payer: Self-pay | Admitting: Family Medicine

## 2013-02-22 ENCOUNTER — Ambulatory Visit (INDEPENDENT_AMBULATORY_CARE_PROVIDER_SITE_OTHER): Payer: Medicare Other | Admitting: Family Medicine

## 2013-02-22 VITALS — BP 133/85 | HR 61 | Temp 97.9°F | Ht 65.0 in | Wt 176.0 lb

## 2013-02-22 DIAGNOSIS — M1990 Unspecified osteoarthritis, unspecified site: Secondary | ICD-10-CM | POA: Diagnosis not present

## 2013-02-22 DIAGNOSIS — M549 Dorsalgia, unspecified: Secondary | ICD-10-CM

## 2013-02-22 DIAGNOSIS — G629 Polyneuropathy, unspecified: Secondary | ICD-10-CM

## 2013-02-22 DIAGNOSIS — G589 Mononeuropathy, unspecified: Secondary | ICD-10-CM | POA: Diagnosis not present

## 2013-02-22 NOTE — Progress Notes (Signed)
Subjective:    Patient ID: [name], female    DOB: 1931/08/16, 80 y.o.   MRN: [id number]  HPI Patient here today for right upper leg and groin pain. This started Monday. Patient is accompanied today by her daughter-n-law.        Patient Active Problem List   Diagnosis Date Noted  . Hypotension, meds stopped this admission 07/29/2011  . Orthostatic hypotension 07/27/2011  . Chest pain, r/o cardiac, negative MI 07/25/2011  . Fatigue X 3wks, presumably secondary to medications 07/25/2011    Class: Acute  . Chronic sinus bradycardia 07/25/2011  . ICD (implantable cardiac defibrillator) in place, VT in 1993, multiple device changes, last in 2009 07/25/2011  . Chronic renal insufficiency, stage III (moderate), Scr 1.5 in the past 07/25/2011    Class: Chronic  . History of DVT in the past, not on Coumadin now 07/25/2011    Class: History of  . Nephrolithiasis, just saw Dr [name] 07/25/2011    Class: History of  . DYSPHAGIA 03/24/2010  . CAD, s/p LAD and Dx stenting in 2003- patent 2006 and 07/28/11. Residual 80% Dx1 12/03/2007  . RECTAL BLEEDING 12/03/2007  . Diarrhea 12/03/2007  . EPIGASTRIC PAIN 12/03/2007  . HYPERCHOLESTEROLEMIA 12/02/2007    Class: History of  . EXTERNAL HEMORRHOIDS 12/02/2007    Class: History of  . ESOPHAGITIS 12/02/2007  . GASTRITIS 12/02/2007  . COLITIS 12/02/2007  . DIVERTICULOSIS, COLON 12/02/2007  . ARTHRITIS 12/02/2007    Class: Chronic   Outpatient Encounter Prescriptions as of 02/22/2013  Medication Sig  . aspirin EC 81 MG tablet Take 81 mg by mouth daily.  . Calcium Citrate-Vitamin D (CITRACAL + D PO) Take 1 tablet by mouth daily.  . Cholecalciferol (VITAMIN D) 1000 UNITS capsule Take 2,000 Units by mouth daily.   Marland Kitchen lisinopril (PRINIVIL,ZESTRIL) 10 MG tablet TAKE  (1)  TABLET TWICE A DAY AS DIRECTED.  Marland Kitchen meclizine (ANTIVERT) 12.5 MG tablet Take 1 tablet (12.5 mg total) by mouth 3 (three) times daily as needed for dizziness.  .  metoprolol tartrate (LOPRESSOR) 25 MG tablet TAKE (1/2) TABLET TWICE DAILY.  . Multiple Vitamins-Minerals (CENTRUM SILVER PO) Take 1 tablet by mouth daily.  . NORPACE CR 100 MG 12 hr capsule TAKE (2) CAPSULES TWICE DAILY.  . pantoprazole (PROTONIX) 40 MG tablet Take 1 tablet (40 mg total) by mouth daily.  Marland Kitchen PARoxetine (PAXIL) 20 MG tablet TAKE 1 TABLET DAILY  . rosuvastatin (CRESTOR) 40 MG tablet Take 20 mg by mouth daily.   Marland Kitchen torsemide (DEMADEX) 20 MG tablet TAKE 1 TABLET DAILY    Review of Systems  Constitutional: Negative.   HENT: Negative.   Eyes: Negative.   Respiratory: Negative.   Cardiovascular: Negative.   Gastrointestinal: Negative.   Endocrine: Negative.   Genitourinary: Negative.   Musculoskeletal: Positive for arthralgias (right leg and right groin pain) and myalgias.  Skin: Negative.   Allergic/Immunologic: Negative.   Neurological: Negative.   Hematological: Negative.   Psychiatric/Behavioral: Negative.        Objective:   Physical Exam  Nursing note and vitals reviewed. Constitutional: She is oriented to person, place, and time. She appears well-developed and well-nourished. No distress.  HENT:  Head: Normocephalic.  Eyes: Conjunctivae are normal. Pupils are equal, round, and reactive to light. Right eye exhibits no discharge. Left eye exhibits no discharge. No scleral icterus.  Neck: Normal range of motion. Neck supple.  Musculoskeletal: She exhibits tenderness. She exhibits no edema.  Patient's range of  motion is limited due to knee pain and back pain with neuropathy. In sitting that pain is relieved by leaning to the left. There is no rash on her back. The reflexes are normal.  Neurological: She is alert and oriented to person, place, and time.  Skin: Skin is warm and dry. No rash noted.  Psychiatric: She has a normal mood and affect. Her behavior is normal. Judgment and thought content normal.   BP 133/85  Pulse 61  Temp(Src) 97.9 F (36.6 C) (Oral)   Ht 5\' 5"  (1.651 m)  Wt 176 lb (79.833 kg)  BMI 29.29 kg/m2        Assessment & Plan:   1. Back pain  2. Neuropathy  3. Osteoarthritis  Patient Instructions  CT scan of back as planned--orthopedists Look into getting a lifeline since she lives by herself at Try to get the bathroom in a handicap mode Also follow through with the cardiologist as planned    The patient was asked to get a lifeline for security at home and her daughter in law understands the need for this.  Patient Instructions  CT scan of back as planned--orthopedists Look into getting a lifeline since she lives by herself at Try to get the bathroom in a handicap mode Also follow through with the cardiologist as planned

## 2013-02-22 NOTE — Patient Instructions (Signed)
CT scan of back as planned--orthopedists Look into getting a lifeline since she lives by herself at Try to get the bathroom in a handicap mode Also follow through with the cardiologist as planned

## 2013-02-24 ENCOUNTER — Ambulatory Visit (INDEPENDENT_AMBULATORY_CARE_PROVIDER_SITE_OTHER): Payer: Medicare Other | Admitting: Cardiovascular Disease

## 2013-02-24 ENCOUNTER — Encounter: Payer: Self-pay | Admitting: Cardiovascular Disease

## 2013-02-24 VITALS — BP 138/80 | HR 64 | Resp 16 | Ht 64.0 in | Wt 178.7 lb

## 2013-02-24 DIAGNOSIS — I495 Sick sinus syndrome: Secondary | ICD-10-CM

## 2013-02-24 DIAGNOSIS — Z95810 Presence of automatic (implantable) cardiac defibrillator: Secondary | ICD-10-CM | POA: Diagnosis not present

## 2013-02-24 DIAGNOSIS — R001 Bradycardia, unspecified: Secondary | ICD-10-CM

## 2013-02-24 DIAGNOSIS — I259 Chronic ischemic heart disease, unspecified: Secondary | ICD-10-CM | POA: Diagnosis not present

## 2013-02-24 LAB — MDC_IDC_ENUM_SESS_TYPE_INCLINIC
Brady Statistic RV Percent Paced: 0 %
Date Time Interrogation Session: 20150209050000
HIGH POWER IMPEDANCE MEASURED VALUE: 40 Ohm
HIGH POWER IMPEDANCE MEASURED VALUE: 50 Ohm
Implantable Pulse Generator Serial Number: 133463
Lead Channel Impedance Value: 866 Ohm
Lead Channel Pacing Threshold Amplitude: 0.6 V
Lead Channel Pacing Threshold Amplitude: 0.8 V
Lead Channel Pacing Threshold Amplitude: 0.8 V
Lead Channel Pacing Threshold Pulse Width: 0.5 ms
Lead Channel Pacing Threshold Pulse Width: 0.5 ms
Lead Channel Pacing Threshold Pulse Width: 0.5 ms
Lead Channel Setting Pacing Amplitude: 2.2 V
Lead Channel Setting Pacing Pulse Width: 0.5 ms
MDC IDC MSMT BATTERY VOLTAGE: 2.58 V
MDC IDC MSMT LEADCHNL RV PACING THRESHOLD AMPLITUDE: 0.8 V
MDC IDC MSMT LEADCHNL RV PACING THRESHOLD PULSEWIDTH: 0.5 ms
MDC IDC MSMT LEADCHNL RV SENSING INTR AMPL: 16.4 mV
MDC IDC SET ZONE DETECTION INTERVAL: 428 ms
Zone Setting Detection Interval: 273 ms
Zone Setting Detection Interval: 363 ms

## 2013-02-24 LAB — PACEMAKER DEVICE OBSERVATION

## 2013-02-24 NOTE — Progress Notes (Signed)
Patient ID: [name], female   DOB: 1931/01/22, 80 y.o.   MRN: [id number]      Reason for office visit CAD, atrial fibrillation, history of ventricular tachycardia   [name] has a history of adrenergically mediated polymorphic VT and received her first implantable defibrillator in 1993. She has been treated with disopyramide for a long time and has not had any defibrillator discharges. Her latest device was implanted in 2009 when she also received a new endocardial defibrillator lead. Much later she developed coronary disease and received stents to the ramus intermedius artery and LAD artery. There has been no evidence of disease progression by subsequent cardiac catheterizations in 2006 & 2013.She has normal left ventricular systolic function and no overt heart failure.   Her current defibrillator is approaching elective replacement indicator which is anticipated to occur in roughly 3-6 months. Current battery voltage is 2.58. ER is is at 2.49 V. The last charge time was 16.6 seconds  She has not had any problems in the 3 months since she was last seen. She has occasional mild dyspnea with activity unchanged from her usual pattern and occasional dizziness. She denies chest pain and lower extremity edema. Labs performed last December were all normal range. Norpace CR is not available and she is receiving immediate release disopyramide and equivalent dose. Medication may be on " backorder" for several more months.  Allergies  Allergen Reactions  . Contrast Media [Iodinated Diagnostic Agents] Other (See Comments)    headache  . Protonix [Pantoprazole Sodium] Other (See Comments)    UNSPECIFIED   . Actos [Pioglitazone] Swelling  . Lipitor [Atorvastatin] Other (See Comments)    myalgia    Current Outpatient Prescriptions  Medication Sig Dispense Refill  . aspirin EC 81 MG tablet Take 81 mg by mouth daily.      . Calcium Citrate-Vitamin D (CITRACAL + D PO) Take 1 tablet by mouth  daily.      . Cholecalciferol (VITAMIN D) 1000 UNITS capsule Take 2,000 Units by mouth daily.       Marland Kitchen lisinopril (PRINIVIL,ZESTRIL) 10 MG tablet TAKE  (1)  TABLET TWICE A DAY AS DIRECTED.  60 tablet  5  . metoprolol tartrate (LOPRESSOR) 25 MG tablet TAKE (1/2) TABLET TWICE DAILY.  30 tablet  11  . Multiple Vitamins-Minerals (CENTRUM SILVER PO) Take 1 tablet by mouth daily.      . NORPACE CR 100 MG 12 hr capsule TAKE (2) CAPSULES TWICE DAILY.  120 capsule  6  . pantoprazole (PROTONIX) 40 MG tablet Take 1 tablet (40 mg total) by mouth daily.  30 tablet  10  . PARoxetine (PAXIL) 20 MG tablet TAKE 1 TABLET DAILY  30 tablet  2  . rosuvastatin (CRESTOR) 40 MG tablet Take 20 mg by mouth daily.       Marland Kitchen torsemide (DEMADEX) 20 MG tablet TAKE 1 TABLET DAILY  30 tablet  2   No current facility-administered medications for this visit.    Past Medical History  Diagnosis Date  . ICD (implantable cardiac defibrillator) in place 93 02/05/07    [name] pt states history of atrial fib/vent tachy-cardiac arrest 93 when icd placed. preop prescription for implantable device faxed to [facility]  . Hypertension     EKG done 1 week ago [name]-requested  . GERD (gastroesophageal reflux disease)     omeprazole  . Dysrhythmia   . Diverticulosis of colon (without mention of hemorrhage) 2007    Colonoscopy   . Myocardial infarction  history of cardiac arrest 93-stents, on plavix-d/c 5 days prior to surger  . Shortness of breath   . H/O hiatal hernia   . Orthostatic hypotension 07/27/2011  . Chronic sinus bradycardia 07/25/2011  . CAD, s/p LAD and Dx stenting in 2003- patent 2006, Myoview low risk 2010. NL LVF 2D 10/12 12/03/2007    Qualifier: Diagnosis of  By: Renee Iles MD [name]   . Chronic renal insufficiency, stage III (moderate), Scr 1.5 in the past 07/25/2011  . History of DVT in the past, not on Coumadin now 07/25/2011  . HYPERCHOLESTEROLEMIA 12/02/2007    Qualifier: Diagnosis of  By: [name] [name]  (AAMA), [name]    . Nephrolithiasis, just saw [name] [name]- "OK" 07/25/2011  . EXTERNAL HEMORRHOIDS 12/02/2007    Qualifier: Diagnosis of  By: [name] [name] ([place]), [name]    . ARTHRITIS 12/02/2007    Qualifier: Diagnosis of  By: [name] [name] ([place]), [name]    . Esophagitis, unspecified 2003    EGD  . Unspecified gastritis and gastroduodenitis without mention of hemorrhage 2003    EGD    Past Surgical History  Procedure Laterality Date  . Cardiac defibrillator placement  02/05/2007    [name]  . Hemorroidectomy  80's  . Hysteroscopy w/d&c  09/02/2010    Procedure: DILATATION AND CURETTAGE (D&C) /HYSTEROSCOPY;  Surgeon: [name];  Location: [facility];  Service: Gynecology;  Laterality: N/A;  Dilation and Curettage with Hysteroscopy and Polypectomy    No family history on file.  History   Social History  . Marital Status: Widowed    Spouse Name: N/A    Number of Children: N/A  . Years of Education: N/A   Occupational History  . Not on file.   Social History Main Topics  . Smoking status: Never Smoker   . Smokeless tobacco: Never Used  . Alcohol Use: No  . Drug Use: No  . Sexual Activity: No   Other Topics Concern  . Not on file   Social History Narrative  . No narrative on file    Review of systems: The patient specifically denies any chest pain at rest exertion, dyspnea at rest or with exertion, orthopnea, paroxysmal nocturnal dyspnea, syncope, palpitations, focal neurological deficits, intermittent claudication, change in her usual mild lower extremity edema and varicose veins, unexplained weight gain, cough, hemoptysis or wheezing.   PHYSICAL EXAM BP 138/80  Pulse 64  Resp 16  Ht 5\' 4"  (1.626 m)  Wt 81.058 kg (178 lb 11.2 oz)  BMI 30.66 kg/m2 General: Alert, oriented x3, no distress  Head: no evidence of trauma, PERRL, EOMI, no exophtalmos or lid lag, no myxedema, no xanthelasma; normal ears, nose and oropharynx  Neck: normal jugular venous pulsations and no  hepatojugular reflux; brisk carotid pulses without delay and no carotid bruits  Chest: clear to auscultation, no signs of consolidation by percussion or palpation, normal fremitus, symmetrical and full respiratory excursions; and right subclavian scar is, healthy defibrillator site in the right subclavian area  Cardiovascular: normal position and quality of the apical impulse, regular rhythm, normal first and second heart sounds, no murmurs, rubs or gallops  Abdomen: no tenderness or distention, no masses by palpation, no abnormal pulsatility or arterial bruits, normal bowel sounds, no hepatosplenomegaly  Extremities: no clubbing, cyanosis; trivial ankle edema; bilateral mild stasis dermatitis;varicose veins more obvious on the right than on the left; 2+ radial, ulnar and brachial pulses bilaterally; 2+ right femoral, posterior tibial and dorsalis pedis pulses; 2+ left femoral, posterior  tibial and dorsalis pedis pulses; no subclavian or femoral bruits  Neurological: grossly nonfocal  EKG: Sinus rhythm with first degree AV block, nonspecific intraventricular conduction with left axis deviation an appearance most consistent with a left bundle branch block he appearance is unchanged from earlier this year and she has had a broad QRS at least for the last couple of years.   Lipid Panel     Component Value Date/Time   CHOL 156 01/06/2013 1218   TRIG 161* 07/31/2012 1019   TRIG 169* 09/02/2010 0629   HDL 50 09/02/2010 0629   CHOLHDL 2.8 09/02/2010 0629   VLDL 34 09/02/2010 0629   LDLCALC 65 07/31/2012 1019   LDLCALC 58 09/02/2010 0629    BMET    Component Value Date/Time   NA 141 01/06/2013 1218   NA 139 07/31/2012 1019   K 4.2 01/06/2013 1218   CL 102 01/06/2013 1218   CO2 24 01/06/2013 1218   GLUCOSE 83 01/06/2013 1218   GLUCOSE 93 07/31/2012 1019   BUN 16 01/06/2013 1218   BUN 17 07/31/2012 1019   CREATININE 1.14* 01/06/2013 1218   CREATININE 1.52* 07/31/2012 1019   CALCIUM 9.3 01/06/2013 1218    GFRNONAA 46* 01/06/2013 1218   GFRAA 53* 01/06/2013 1218     ASSESSMENT AND PLAN Defibrillator generator is approaching ERI and the charge time has become quite lengthy. We will increase the frequency of device checks to every 2 months.   She has no signs or symptoms of active coronary insufficiency .  Orders Placed This Encounter  Procedures  . Implantable device check   No orders of the defined types were placed in this encounter.    Holli Humbles, MD, [place] [phone] office [phone] pager

## 2013-02-24 NOTE — Patient Instructions (Signed)
Your physician recommends that you schedule a follow-up appointment in: 2 months with Dr.Croitoru + ICD check

## 2013-02-26 ENCOUNTER — Other Ambulatory Visit: Payer: Self-pay | Admitting: Orthopedic Surgery

## 2013-02-26 DIAGNOSIS — IMO0002 Reserved for concepts with insufficient information to code with codable children: Secondary | ICD-10-CM

## 2013-02-26 DIAGNOSIS — M48061 Spinal stenosis, lumbar region without neurogenic claudication: Secondary | ICD-10-CM

## 2013-03-04 ENCOUNTER — Other Ambulatory Visit: Payer: Medicare Other

## 2013-03-04 ENCOUNTER — Inpatient Hospital Stay: Admission: RE | Admit: 2013-03-04 | Payer: Medicare Other | Source: Ambulatory Visit

## 2013-03-07 ENCOUNTER — Ambulatory Visit
Admission: RE | Admit: 2013-03-07 | Discharge: 2013-03-07 | Disposition: A | Discharge status: Home / Self Care | Payer: Medicare Other | Source: Ambulatory Visit | Attending: Orthopedic Surgery | Admitting: Orthopedic Surgery

## 2013-03-07 VITALS — BP 151/69 | HR 56

## 2013-03-07 DIAGNOSIS — M48061 Spinal stenosis, lumbar region without neurogenic claudication: Secondary | ICD-10-CM

## 2013-03-07 DIAGNOSIS — M47817 Spondylosis without myelopathy or radiculopathy, lumbosacral region: Secondary | ICD-10-CM | POA: Diagnosis not present

## 2013-03-07 DIAGNOSIS — IMO0002 Reserved for concepts with insufficient information to code with codable children: Secondary | ICD-10-CM

## 2013-03-07 DIAGNOSIS — M5137 Other intervertebral disc degeneration, lumbosacral region: Secondary | ICD-10-CM | POA: Diagnosis not present

## 2013-03-07 MED ORDER — DIAZEPAM 5 MG PO TABS
5.0000 mg | ORAL_TABLET | Freq: Once | ORAL | Status: AC
  Administered 2013-03-07: 5 mg via ORAL

## 2013-03-07 MED ORDER — IOHEXOL 180 MG/ML  SOLN
15.0000 mL | Freq: Once | INTRAMUSCULAR | Status: AC | PRN
  Administered 2013-03-07: 15 mL via INTRATHECAL

## 2013-03-07 NOTE — Progress Notes (Signed)
Patient states she has been off Paxil for at least the past two days.

## 2013-03-07 NOTE — Discharge Instructions (Signed)
Myelogram Discharge Instructions  1. Go home and rest quietly for the next 24 hours.  It is important to lie flat for the next 24 hours.  Get up only to go to the restroom.  You may lie in the bed or on a couch on your back, your stomach, your left side or your right side.  You may have one pillow under your head.  You may have pillows between your knees while you are on your side or under your knees while you are on your back.  2. DO NOT drive today.  Recline the seat as far back as it will go, while still wearing your seat belt, on the way home.  3. You may get up to go to the bathroom as needed.  You may sit up for 10 minutes to eat.  You may resume your normal diet and medications unless otherwise indicated.  Drink lots of extra fluids today and tomorrow.  4. The incidence of headache, nausea, or vomiting is about 5% (one in 20 patients).  If you develop a headache, lie flat and drink plenty of fluids until the headache goes away.  Caffeinated beverages may be helpful.  If you develop severe nausea and vomiting or a headache that does not go away with flat bed rest, call [phone].  5. You may resume normal activities after your 24 hours of bed rest is over; however, do not exert yourself strongly or do any heavy lifting tomorrow. If when you get up you have a headache when standing, go back to bed and force fluids for another 24 hours.  6. Call your physician for a follow-up appointment.  The results of your myelogram will be sent directly to your physician by the following day.  7. If you have any questions or if complications develop after you arrive home, please call [phone].  Discharge instructions have been explained to the patient.  The patient, or the person responsible for the patient, fully understands these instructions.      May resume paxil on Feb. 21, 2015, after 11:00 am.

## 2013-03-18 DIAGNOSIS — H35059 Retinal neovascularization, unspecified, unspecified eye: Secondary | ICD-10-CM | POA: Diagnosis not present

## 2013-03-18 DIAGNOSIS — H353290 Exudative age-related macular degeneration, unspecified eye, stage unspecified: Secondary | ICD-10-CM | POA: Diagnosis not present

## 2013-03-26 ENCOUNTER — Other Ambulatory Visit: Payer: Self-pay | Admitting: Family Medicine

## 2013-03-28 ENCOUNTER — Encounter: Payer: Self-pay | Admitting: Cardiovascular Disease

## 2013-04-02 DIAGNOSIS — M25569 Pain in unspecified knee: Secondary | ICD-10-CM | POA: Diagnosis not present

## 2013-04-02 DIAGNOSIS — M4120 Other idiopathic scoliosis, site unspecified: Secondary | ICD-10-CM | POA: Diagnosis not present

## 2013-04-02 DIAGNOSIS — M48061 Spinal stenosis, lumbar region without neurogenic claudication: Secondary | ICD-10-CM | POA: Diagnosis not present

## 2013-04-02 DIAGNOSIS — M5137 Other intervertebral disc degeneration, lumbosacral region: Secondary | ICD-10-CM | POA: Diagnosis not present

## 2013-04-15 ENCOUNTER — Telehealth: Payer: Self-pay | Admitting: Cardiovascular Disease

## 2013-04-22 DIAGNOSIS — H353290 Exudative age-related macular degeneration, unspecified eye, stage unspecified: Secondary | ICD-10-CM | POA: Diagnosis not present

## 2013-04-22 DIAGNOSIS — H35059 Retinal neovascularization, unspecified, unspecified eye: Secondary | ICD-10-CM | POA: Diagnosis not present

## 2013-05-01 ENCOUNTER — Encounter: Payer: Self-pay | Admitting: Cardiovascular Disease

## 2013-05-01 ENCOUNTER — Ambulatory Visit (INDEPENDENT_AMBULATORY_CARE_PROVIDER_SITE_OTHER): Payer: Medicare Other | Admitting: Cardiovascular Disease

## 2013-05-01 ENCOUNTER — Encounter: Payer: Medicare Other | Admitting: Cardiovascular Disease

## 2013-05-01 VITALS — BP 122/86 | HR 59 | Resp 16 | Ht 65.0 in | Wt 180.4 lb

## 2013-05-01 DIAGNOSIS — R002 Palpitations: Secondary | ICD-10-CM

## 2013-05-01 DIAGNOSIS — I259 Chronic ischemic heart disease, unspecified: Secondary | ICD-10-CM

## 2013-05-01 DIAGNOSIS — E7800 Pure hypercholesterolemia, unspecified: Secondary | ICD-10-CM | POA: Diagnosis not present

## 2013-05-01 DIAGNOSIS — I495 Sick sinus syndrome: Secondary | ICD-10-CM

## 2013-05-01 DIAGNOSIS — Z95810 Presence of automatic (implantable) cardiac defibrillator: Secondary | ICD-10-CM

## 2013-05-01 DIAGNOSIS — R001 Bradycardia, unspecified: Secondary | ICD-10-CM

## 2013-05-01 NOTE — Assessment & Plan Note (Signed)
Generally she has excellent lipid profile with exception of borderline hypertriglyceridemia. We'll pretreat her more recent results from Dr. Tawanna Sat office the

## 2013-05-01 NOTE — Assessment & Plan Note (Signed)
No signs or symptoms of coronary insufficiency or heart failure at this time.

## 2013-05-01 NOTE — Progress Notes (Signed)
Patient ID: [name], female   DOB: [birth year], 80 y.o.   MRN: [id number]      Reason for office visit CAD, atrial fibrillation, history of ventricular tachycardia  [name] has a history of adrenergically mediated polymorphic VT and received her first implantable defibrillator in 1993. She has been treated with disopyramide for a long time and has not had any defibrillator discharges. Her latest device was implanted in 2009 when she also received a new endocardial defibrillator lead. Much later she developed coronary disease and received stents to the ramus intermedius artery and LAD artery. There has been no evidence of disease progression by subsequent cardiac catheterizations in 2006 & 2013.She has normal left ventricular systolic function and no overt heart failure.  Her current defibrillator is approaching elective replacement indicator which is anticipated to occur in roughly 3-6 months. Current battery voltage is 2.57 V and 2 months ago it was 2.58. ERI is at 2.49 V. The last charge time was 16.6 seconds.  She was unable to receive Norpace CR from the pharmacy for while and did not feel as well when taking immediate release conventional disopyramide. She had more problems with dizziness. She has managed to secure the sustained-release formulation just 2 weeks ago and is feeling better.  She does complain of occasional problems with diarrhea. She continues to be mostly limited by pain in her right knee and her orthopedic surgeon has recommended total knee replacement but she wants to delay this until it is absolutely necessary.   Allergies  Allergen Reactions  . Actos [Pioglitazone] Swelling  . Contrast Media [Iodinated Diagnostic Agents] Other (See Comments)    Headache (no action/pre-med required)  . Lipitor [Atorvastatin] Other (See Comments)    myalgia  . Protonix [Pantoprazole Sodium] Diarrhea    Current Outpatient Prescriptions  Medication Sig Dispense Refill  .  aspirin EC 81 MG tablet Take 81 mg by mouth daily.      . Calcium Citrate-Vitamin D (CITRACAL + D PO) Take 1 tablet by mouth daily.      . Cholecalciferol (VITAMIN D) 1000 UNITS capsule Take 2,000 Units by mouth daily.       Marland Kitchen lisinopril (PRINIVIL,ZESTRIL) 10 MG tablet TAKE  (1)  TABLET TWICE A DAY AS DIRECTED.  60 tablet  3  . metoprolol tartrate (LOPRESSOR) 25 MG tablet TAKE (1/2) TABLET TWICE DAILY.  30 tablet  11  . Multiple Vitamins-Minerals (CENTRUM SILVER PO) Take 1 tablet by mouth daily.      . NORPACE CR 100 MG 12 hr capsule TAKE (2) CAPSULES TWICE DAILY.  120 capsule  6  . pantoprazole (PROTONIX) 40 MG tablet Take 1 tablet (40 mg total) by mouth daily.  30 tablet  10  . PARoxetine (PAXIL) 20 MG tablet TAKE 1 TABLET DAILY  30 tablet  2  . rosuvastatin (CRESTOR) 40 MG tablet Take 20 mg by mouth daily.       Marland Kitchen torsemide (DEMADEX) 20 MG tablet TAKE 1 TABLET DAILY  30 tablet  2   No current facility-administered medications for this visit.    Past Medical History  Diagnosis Date  . ICD (implantable cardiac defibrillator) in place 93 02/05/07    [facility] pt states history of atrial fib/vent tachy-cardiac arrest 93 when icd placed. preop prescription for implantable device faxed to [facility]  . Hypertension     EKG done 1 week ago [facility]-requested  . GERD (gastroesophageal reflux disease)     omeprazole  . Dysrhythmia   .  Diverticulosis of colon (without mention of hemorrhage) 2007    Colonoscopy   . Myocardial infarction     history of cardiac arrest 93-stents, on plavix-d/c 5 days prior to surger  . Shortness of breath   . H/O hiatal hernia   . Orthostatic hypotension 07/27/2011  . Chronic sinus bradycardia 07/25/2011  . CAD, s/p LAD and Dx stenting in 2003- patent 2006, Myoview low risk 2010. NL LVF 2D 10/12 12/03/2007    Qualifier: Diagnosis of  By: Sharlett Iles MD Byrd Hesselbach   . Chronic renal insufficiency, stage III (moderate), Scr 1.5 in the past 07/25/2011  . History of  DVT in the past, not on Coumadin now 07/25/2011  . HYPERCHOLESTEROLEMIA 12/02/2007    Qualifier: Diagnosis of  By: Nils Pyle CMA (AAMA), [name]    . Nephrolithiasis, just saw Dr [name] 07/25/2011  . EXTERNAL HEMORRHOIDS 12/02/2007    Qualifier: Diagnosis of  By: Nils Pyle CMA ([place]), [name]    . ARTHRITIS 12/02/2007    Qualifier: Diagnosis of  By: Nils Pyle CMA ([place]), [name]    . Esophagitis, unspecified 2003    EGD  . Unspecified gastritis and gastroduodenitis without mention of hemorrhage 2003    EGD    Past Surgical History  Procedure Laterality Date  . Cardiac defibrillator placement  02/05/2007    [facility]  . Hemorroidectomy  80's  . Hysteroscopy w/d&c  09/02/2010    Procedure: DILATATION AND CURETTAGE (D&C) /HYSTEROSCOPY;  Surgeon: Margarette Asal;  Location: [facility];  Service: Gynecology;  Laterality: N/A;  Dilation and Curettage with Hysteroscopy and Polypectomy    No family history on file.  History   Social History  . Marital Status: Widowed    Spouse Name: N/A    Number of Children: N/A  . Years of Education: N/A   Occupational History  . Not on file.   Social History Main Topics  . Smoking status: Never Smoker   . Smokeless tobacco: Never Used  . Alcohol Use: No  . Drug Use: No  . Sexual Activity: No   Other Topics Concern  . Not on file   Social History Narrative  . No narrative on file    Review of systems: The patient specifically denies any chest pain at rest or with exertion, dyspnea at rest or with exertion, orthopnea, paroxysmal nocturnal dyspnea, syncope, palpitations, focal neurological deficits, intermittent claudication, lower extremity edema, unexplained weight gain, cough, hemoptysis or wheezing.  The patient also denies abdominal pain, nausea, vomiting, dysphagia, constipation, polyuria, polydipsia, dysuria, hematuria, frequency, urgency, abnormal bleeding or bruising, fever, chills, unexpected weight changes, mood swings, change in skin or hair  texture, change in voice quality, auditory or visual problems, allergic reactions or rashes, new musculoskeletal complaints other than usual "aches and pains".   PHYSICAL EXAM BP 122/86  Pulse 59  Resp 16  Ht 5\' 5"  (1.651 m)  Wt 180 lb 6.4 oz (81.829 kg)  BMI 30.02 kg/m2 General: Alert, oriented x3, no distress  Head: no evidence of trauma, PERRL, EOMI, no exophtalmos or lid lag, no myxedema, no xanthelasma; normal ears, nose and oropharynx  Neck: normal jugular venous pulsations and no hepatojugular reflux; brisk carotid pulses without delay and no carotid bruits  Chest: clear to auscultation, no signs of consolidation by percussion or palpation, normal fremitus, symmetrical and full respiratory excursions; and right subclavian scar is, healthy defibrillator site in the right subclavian area  Cardiovascular: normal position and quality of the apical impulse, regular rhythm, normal first and second  heart sounds, no murmurs, rubs or gallops  Abdomen: no tenderness or distention, no masses by palpation, no abnormal pulsatility or arterial bruits, normal bowel sounds, no hepatosplenomegaly  Extremities: no clubbing, cyanosis; trivial ankle edema; bilateral mild stasis dermatitis;varicose veins more obvious on the right than on the left; 2+ radial, ulnar and brachial pulses bilaterally; 2+ right femoral, posterior tibial and dorsalis pedis pulses; 2+ left femoral, posterior tibial and dorsalis pedis pulses; no subclavian or femoral bruits  Neurological: grossly nonfocal  EKG: Sinus rhythm with first degree AV block, nonspecific intraventricular conduction with left axis deviation an appearance most consistent with a left bundle branch block he appearance is unchanged from earlier this year and she has had a broad QRS at least for the last couple of years.    EKG: Normal sinus rhythm/mild sinus bradycardia with first degree AV block left axis deviation and left bundle branch block with a QRS duration  of 126 ms  Lipid Panel     Component Value Date/Time   CHOL 156 01/06/2013 1218   TRIG 161* 07/31/2012 1019   TRIG 169* 09/02/2010 0629   HDL 50 09/02/2010 0629   CHOLHDL 2.8 09/02/2010 0629   VLDL 34 09/02/2010 0629   LDLCALC 65 07/31/2012 1019   LDLCALC 58 09/02/2010 0629    BMET    Component Value Date/Time   NA 141 01/06/2013 1218   NA 139 07/31/2012 1019   K 4.2 01/06/2013 1218   CL 102 01/06/2013 1218   CO2 24 01/06/2013 1218   GLUCOSE 83 01/06/2013 1218   GLUCOSE 93 07/31/2012 1019   BUN 16 01/06/2013 1218   BUN 17 07/31/2012 1019   CREATININE 1.14* 01/06/2013 1218   CREATININE 1.52* 07/31/2012 1019   CALCIUM 9.3 01/06/2013 1218   GFRNONAA 46* 01/06/2013 1218   GFRNONAA 33* 07/31/2012 1019   GFRAA 53* 01/06/2013 1218   GFRAA 38* 07/31/2012 1019     ASSESSMENT AND PLAN CAD, s/p LAD and Dx stenting in 2003- patent 2006 and 07/28/11. Residual 80% Dx1 No signs or symptoms of coronary insufficiency or heart failure at this time.  HYPERCHOLESTEROLEMIA Generally she has excellent lipid profile with exception of borderline hypertriglyceridemia. We'll pretreat her more recent results from Dr. Tawanna Sat office the  Automatic implantable cardioverter-defibrillator in situ Normal device function. Excellent lead parameters. Her generator is approaching ERI but the battery voltage the case has been very slow and it is likely we'll take more than 6 months until we reach that point. Her devices recorded a couple of extremely brief episodes of nonsustained monomorphic tachycardia that is quite irregular  And has a QRS morphology that is only slightly different from baseline. I suspect this represents brief paroxysmal atrial fibrillation with aberrancy rather than ventricular tachycardia.   Patient Instructions  Your physician recommends that you schedule a follow-up appointment in: 3 months with the [facility].  Your physician recommends that you schedule a follow-up appointment in: 6  months with Dr. Sallyanne Kuster.      Orders Placed This Encounter  Procedures  . EKG 12-Lead   No orders of the defined types were placed in this encounter.    Mearl Olver  Sanda Klein, MD, [facility] [phone] office [phone] pager

## 2013-05-01 NOTE — Assessment & Plan Note (Signed)
Normal device function. Excellent lead parameters. Her generator is approaching ERI but the battery voltage the case has been very slow and it is likely we'll take more than 6 months until we reach that point. Her devices recorded a couple of extremely brief episodes of nonsustained monomorphic tachycardia that is quite irregular  And has a QRS morphology that is only slightly different from baseline. I suspect this represents brief paroxysmal atrial fibrillation with aberrancy rather than ventricular tachycardia.

## 2013-05-01 NOTE — Patient Instructions (Signed)
Your physician recommends that you schedule a follow-up appointment in: 3 months with the [facility].  Your physician recommends that you schedule a follow-up appointment in: 6 months with Dr. Sallyanne Kuster.

## 2013-05-07 ENCOUNTER — Ambulatory Visit (INDEPENDENT_AMBULATORY_CARE_PROVIDER_SITE_OTHER): Payer: Medicare Other | Admitting: Family Medicine

## 2013-05-07 ENCOUNTER — Telehealth: Payer: Self-pay | Admitting: Family Medicine

## 2013-05-07 ENCOUNTER — Encounter: Payer: Self-pay | Admitting: Family Medicine

## 2013-05-07 VITALS — BP 155/83 | HR 54 | Temp 97.0°F | Ht 65.0 in | Wt 178.0 lb

## 2013-05-07 DIAGNOSIS — E8881 Metabolic syndrome: Secondary | ICD-10-CM | POA: Diagnosis not present

## 2013-05-07 DIAGNOSIS — M1710 Unilateral primary osteoarthritis, unspecified knee: Secondary | ICD-10-CM

## 2013-05-07 DIAGNOSIS — IMO0002 Reserved for concepts with insufficient information to code with codable children: Secondary | ICD-10-CM

## 2013-05-07 DIAGNOSIS — R109 Unspecified abdominal pain: Secondary | ICD-10-CM

## 2013-05-07 DIAGNOSIS — N183 Chronic kidney disease, stage 3 unspecified: Secondary | ICD-10-CM

## 2013-05-07 DIAGNOSIS — I259 Chronic ischemic heart disease, unspecified: Secondary | ICD-10-CM | POA: Diagnosis not present

## 2013-05-07 DIAGNOSIS — M129 Arthropathy, unspecified: Secondary | ICD-10-CM | POA: Diagnosis not present

## 2013-05-07 DIAGNOSIS — E7800 Pure hypercholesterolemia, unspecified: Secondary | ICD-10-CM

## 2013-05-07 DIAGNOSIS — M1712 Unilateral primary osteoarthritis, left knee: Secondary | ICD-10-CM

## 2013-05-07 DIAGNOSIS — E559 Vitamin D deficiency, unspecified: Secondary | ICD-10-CM | POA: Diagnosis not present

## 2013-05-07 DIAGNOSIS — N2889 Other specified disorders of kidney and ureter: Secondary | ICD-10-CM

## 2013-05-07 LAB — POCT URINALYSIS DIPSTICK
Bilirubin, UA: NEGATIVE
Glucose, UA: NEGATIVE
Ketones, UA: NEGATIVE
Nitrite, UA: NEGATIVE
PROTEIN UA: NEGATIVE
SPEC GRAV UA: 1.02
Urobilinogen, UA: NEGATIVE
pH, UA: 5

## 2013-05-07 LAB — POCT CBC
GRANULOCYTE PERCENT: 60.6 % (ref 37–80)
HEMATOCRIT: 40.9 % (ref 37.7–47.9)
HEMOGLOBIN: 13 g/dL (ref 12.2–16.2)
LYMPH, POC: 2.1 (ref 0.6–3.4)
MCH, POC: 30 pg (ref 27–31.2)
MCHC: 31.9 g/dL (ref 31.8–35.4)
MCV: 94.2 fL (ref 80–97)
MPV: 6.8 fL (ref 0–99.8)
POC Granulocyte: 4.2 (ref 2–6.9)
POC LYMPH PERCENT: 29.6 %L (ref 10–50)
Platelet Count, POC: 226 10*3/uL (ref 142–424)
RBC: 4.3 M/uL (ref 4.04–5.48)
RDW, POC: 14.7 %
WBC: 7 10*3/uL (ref 4.6–10.2)

## 2013-05-07 LAB — POCT UA - MICROSCOPIC ONLY
Bacteria, U Microscopic: NEGATIVE
CASTS, UR, LPF, POC: NEGATIVE
CRYSTALS, UR, HPF, POC: NEGATIVE
Mucus, UA: NEGATIVE
YEAST UA: NEGATIVE

## 2013-05-07 LAB — POCT GLYCOSYLATED HEMOGLOBIN (HGB A1C): Hemoglobin A1C: 6

## 2013-05-07 MED ORDER — DISOPYRAMIDE PHOSPHATE ER 100 MG PO CP12: ORAL_CAPSULE | ORAL | Status: DC

## 2013-05-07 MED ORDER — LISINOPRIL 10 MG PO TABS: ORAL_TABLET | ORAL | Status: DC

## 2013-05-07 NOTE — Telephone Encounter (Signed)
There were a few RBC and WBC in urine. Culture pending. Patient notified. She was concerned about kidney stones. Urology referral pending as well. She will call back if symptoms worsen.

## 2013-05-07 NOTE — Patient Instructions (Addendum)
Medicare Annual Wellness Visit  [place] and the medical providers at [facility] strive to bring you the best medical care.  In doing so we not only want to address your current medical conditions and concerns but also to detect new conditions early and prevent illness, disease and health-related problems.    Medicare offers a yearly Wellness Visit which allows our clinical staff to assess your need for preventative services including immunizations, lifestyle education, counseling to decrease risk of preventable diseases and screening for fall risk and other medical concerns.    This visit is provided free of charge (no copay) for all Medicare recipients. The clinical pharmacists at [facility] have begun to conduct these Wellness Visits which will also include a thorough review of all your medications.    As you primary medical provider recommend that you make an appointment for your Annual Wellness Visit if you have not done so already this year.  You may set up this appointment before you leave today or you may call back [phone] and schedule an appointment.  Please make sure when you call that you mention that you are scheduling your Annual Wellness Visit with the clinical pharmacist so that the appointment may be made for the proper length of time.       Continue current medications. Continue good therapeutic lifestyle changes which include good diet and exercise. Fall precautions discussed with patient. If an FOBT was given today- please return it to our front desk. If you are over 51 years old - you may need Prevnar 9 or the adult Pneumonia vaccine.  Follow up with cardiologist as planned Make sure that you discuss your potential knee surgery with a cardiologist so that he can give you An okay for this. Return the FOBT We will call you with the results of the lab work once those results are available

## 2013-05-07 NOTE — Addendum Note (Signed)
Addended by: Pollyann Kennedy F on: 05/07/2013 05:41 PM   Modules accepted: Orders

## 2013-05-07 NOTE — Progress Notes (Signed)
Subjective:    Patient ID: [name], female    DOB: Nov 19, 1931, 80 y.o.   MRN: [id number]  HPI Pt here for follow up and management of chronic medical problems. The patient sees a cardiologist regularly. She does complain today of some urinary urgency and difficulty voiding. She also has some complaints with sinus congestion. Health maintenance issues she is due to get lab work today, return and FOBT, and she will be scheduled for a pelvic exam. We will refill her Norpace and her lisinopril. The patient is to send her for a pacemaker. She also complains of some right flank pain and right upper quadrant pain and does have nausea associated with this. It does not bother her at nighttime. She has a history of kidney stones. And she indicates that her voiding habits are slow.       Patient Active Problem List   Diagnosis Date Noted  . Metabolic syndrome 39/76/7341  . Hypotension, meds stopped this admission 07/29/2011  . Orthostatic hypotension 07/27/2011  . Chest pain, r/o cardiac, negative MI 07/25/2011  . Fatigue X 3wks, presumably secondary to medications 07/25/2011    Class: Acute  . Chronic sinus bradycardia 07/25/2011  . Automatic implantable cardioverter-defibrillator in situ 07/25/2011  . Chronic renal insufficiency, stage III (moderate), Scr 1.5 in the past 07/25/2011    Class: Chronic  . History of DVT in the past, not on Coumadin now 07/25/2011    Class: History of  . Nephrolithiasis, just saw Dr [name] 07/25/2011    Class: History of  . DYSPHAGIA 03/24/2010  . CAD, s/p LAD and Dx stenting in 2003- patent 2006 and 07/28/11. Residual 80% Dx1 12/03/2007  . RECTAL BLEEDING 12/03/2007  . Diarrhea 12/03/2007  . EPIGASTRIC PAIN 12/03/2007  . HYPERCHOLESTEROLEMIA 12/02/2007    Class: History of  . EXTERNAL HEMORRHOIDS 12/02/2007    Class: History of  . ESOPHAGITIS 12/02/2007  . GASTRITIS 12/02/2007  . COLITIS 12/02/2007  . DIVERTICULOSIS, COLON 12/02/2007  .  ARTHRITIS 12/02/2007    Class: Chronic   Outpatient Encounter Prescriptions as of 05/07/2013  Medication Sig  . aspirin EC 81 MG tablet Take 81 mg by mouth daily.  . Calcium Citrate-Vitamin D (CITRACAL + D PO) Take 1 tablet by mouth daily.  . Cholecalciferol (VITAMIN D) 1000 UNITS capsule Take 2,000 Units by mouth daily.   Marland Kitchen lisinopril (PRINIVIL,ZESTRIL) 10 MG tablet TAKE  (1)  TABLET TWICE A DAY  . metoprolol tartrate (LOPRESSOR) 25 MG tablet TAKE (1/2) TABLET TWICE DAILY.  . Multiple Vitamins-Minerals (CENTRUM SILVER PO) Take 1 tablet by mouth daily.  . NORPACE CR 100 MG 12 hr capsule TAKE (2) CAPSULES TWICE DAILY.  . pantoprazole (PROTONIX) 40 MG tablet Take 1 tablet (40 mg total) by mouth daily.  Marland Kitchen PARoxetine (PAXIL) 20 MG tablet TAKE 1 TABLET DAILY  . rosuvastatin (CRESTOR) 40 MG tablet Take 40 mg by mouth daily.   Marland Kitchen torsemide (DEMADEX) 20 MG tablet TAKE 1 TABLET DAILY  . [DISCONTINUED] lisinopril (PRINIVIL,ZESTRIL) 10 MG tablet TAKE  (1)  TABLET TWICE A DAY AS DIRECTED.    Review of Systems  Constitutional: Negative.   HENT: Positive for sinus pressure.   Eyes: Negative.   Respiratory: Negative.   Cardiovascular: Negative.   Gastrointestinal: Positive for abdominal pain.       Right side pain  Endocrine: Negative.   Genitourinary: Positive for urgency and difficulty urinating. Negative for dysuria and frequency.  Musculoskeletal: Negative.   Skin: Negative.  Allergic/Immunologic: Negative.   Neurological: Negative.   Hematological: Negative.   Psychiatric/Behavioral: Negative.        Objective:   Physical Exam  Nursing note and vitals reviewed. Constitutional: She is oriented to person, place, and time. She appears well-developed and well-nourished. No distress.  HENT:  Head: Normocephalic and atraumatic.  Right Ear: External ear normal.  Left Ear: External ear normal.  Mouth/Throat: Oropharynx is clear and moist. No oropharyngeal exudate.  Nasal congestion  bilaterally  Eyes: Conjunctivae and EOM are normal. Pupils are equal, round, and reactive to light. Right eye exhibits no discharge. Left eye exhibits no discharge. No scleral icterus.  Neck: Normal range of motion. Neck supple. No thyromegaly present.  No carotid bruits  Cardiovascular: Normal rate, regular rhythm, normal heart sounds and intact distal pulses.  Exam reveals no gallop and no friction rub.   No murmur heard. At 60 per minute  Pulmonary/Chest: Effort normal and breath sounds normal. No respiratory distress. She has no wheezes. She has no rales. She exhibits no tenderness.  Abdominal: Soft. Bowel sounds are normal. She exhibits no mass. There is tenderness (tender right upper quadrant and slight tenderness in the epigastrium). There is no rebound and no guarding.  Musculoskeletal: Normal range of motion. She exhibits no edema and no tenderness.  Lymphadenopathy:    She has no cervical adenopathy.  Neurological: She is alert and oriented to person, place, and time. She has normal reflexes.  Skin: Skin is warm and dry. No rash noted.  Psychiatric: She has a normal mood and affect. Her behavior is normal. Judgment and thought content normal.   BP 155/83  Pulse 54  Temp(Src) 97 F (36.1 C) (Oral)  Ht 5' 5"  (1.651 m)  Wt 178 lb (80.74 kg)  BMI 29.62 kg/m2        Assessment & Plan:  1. ARTHRITIS - POCT CBC - Vit D  25 hydroxy (rtn osteoporosis monitoring)  2. Chronic renal insufficiency, stage III (moderate), Scr 1.5 in the past - POCT CBC - BMP8+EGFR  3. CAD, s/p LAD and Dx stenting in 2003- patent 2006 and 07/28/11. Residual 80% Dx1 - POCT CBC - BMP8+EGFR - Hepatic function panel  4. HYPERCHOLESTEROLEMIA - POCT CBC - NMR, lipoprofile  5. Metabolic syndrome - POCT CBC - POCT glycosylated hemoglobin (Hb A1C)  6. Vitamin D deficiency - Vit D  25 hydroxy (rtn osteoporosis monitoring)  7. Right sided abdominal pain - POCT UA - Microscopic Only - POCT  urinalysis dipstick - US Abdomen Complete; Future  8. Osteoarthritis of left knee  Meds ordered this encounter  Medications  . DISCONTD: lisinopril (PRINIVIL,ZESTRIL) 10 MG tablet    Sig: TAKE  (1)  TABLET TWICE A DAY  . lisinopril (PRINIVIL,ZESTRIL) 10 MG tablet    Sig: TAKE  (1)  TABLET TWICE A DAY    Dispense:  60 tablet    Refill:  6  . disopyramide (NORPACE CR) 100 MG 12 hr capsule    Sig: TAKE (2) CAPSULES TWICE DAILY.    Dispense:  360 capsule    Refill:  3   Patient Instructions                       Medicare Annual Wellness Visit  [facility] and the medical providers at [facility] strive to bring you the best medical care.  In doing so we not only want to address your current medical conditions and concerns but also to  detect new conditions early and prevent illness, disease and health-related problems.    Medicare offers a yearly Wellness Visit which allows our clinical staff to assess your need for preventative services including immunizations, lifestyle education, counseling to decrease risk of preventable diseases and screening for fall risk and other medical concerns.    This visit is provided free of charge (no copay) for all Medicare recipients. The clinical pharmacists at [facility] have begun to conduct these Wellness Visits which will also include a thorough review of all your medications.    As you primary medical provider recommend that you make an appointment for your Annual Wellness Visit if you have not done so already this year.  You may set up this appointment before you leave today or you may call back [phone] and schedule an appointment.  Please make sure when you call that you mention that you are scheduling your Annual Wellness Visit with the clinical pharmacist so that the appointment may be made for the proper length of time.       Continue current medications. Continue good therapeutic lifestyle  changes which include good diet and exercise. Fall precautions discussed with patient. If an FOBT was given today- please return it to our front desk. If you are over 59 years old - you may need Prevnar 21 or the adult Pneumonia vaccine.  Follow up with cardiologist as planned Make sure that you discuss your potential knee surgery with a cardiologist so that he can give you An okay for this. Return the FOBT We will call you with the results of the lab work once those results are available   Arrie Senate MD

## 2013-05-08 ENCOUNTER — Telehealth: Payer: Self-pay | Admitting: Family Medicine

## 2013-05-08 LAB — MDC_IDC_ENUM_SESS_TYPE_INCLINIC
Battery Voltage: 2.57 V
Brady Statistic RV Percent Paced: 1 %
HighPow Impedance: 49 Ohm
Lead Channel Impedance Value: 838 Ohm
Lead Channel Pacing Threshold Pulse Width: 0.5 ms
Lead Channel Setting Pacing Amplitude: 2.2 V
Lead Channel Setting Pacing Pulse Width: 0.5 ms
MDC IDC MSMT LEADCHNL RV PACING THRESHOLD AMPLITUDE: 0.8 V
MDC IDC MSMT LEADCHNL RV SENSING INTR AMPL: 16.4 mV
MDC IDC PG SERIAL: 133463
MDC IDC SET ZONE DETECTION INTERVAL: 428 ms
Zone Setting Detection Interval: 273 ms
Zone Setting Detection Interval: 363 ms

## 2013-05-08 LAB — BMP8+EGFR
BUN / CREAT RATIO: 17 (ref 11–26)
BUN: 25 mg/dL (ref 8–27)
CHLORIDE: 104 mmol/L (ref 97–108)
CO2: 20 mmol/L (ref 18–29)
CREATININE: 1.49 mg/dL — AB (ref 0.57–1.00)
Calcium: 9.4 mg/dL (ref 8.7–10.3)
GFR calc Af Amer: 38 mL/min/{1.73_m2} — ABNORMAL LOW (ref >59)
GFR, EST NON AFRICAN AMERICAN: 33 mL/min/{1.73_m2} — AB (ref >59)
GLUCOSE: 91 mg/dL (ref 65–99)
Potassium: 5.1 mmol/L (ref 3.5–5.2)
Sodium: 140 mmol/L (ref 134–144)

## 2013-05-08 LAB — HEPATIC FUNCTION PANEL
ALBUMIN: 4.1 g/dL (ref 3.5–4.8)
ALK PHOS: 80 IU/L (ref 39–117)
ALT: 28 IU/L (ref 0–32)
AST: 21 IU/L (ref 0–40)
BILIRUBIN DIRECT: 0.16 mg/dL (ref 0.00–0.40)
BILIRUBIN TOTAL: 0.6 mg/dL (ref 0.0–1.2)
TOTAL PROTEIN: 6.9 g/dL (ref 6.0–8.5)

## 2013-05-08 LAB — NMR, LIPOPROFILE
CHOLESTEROL: 176 mg/dL (ref <200)
HDL Cholesterol by NMR: 65 mg/dL (ref >=40)
HDL PARTICLE NUMBER: 43.3 umol/L (ref >=30.5)
LDL Particle Number: 1050 nmol/L — ABNORMAL HIGH (ref <1000)
LDL Size: 20.6 nm (ref >20.5)
LDLC SERPL CALC-MCNC: 82 mg/dL (ref <100)
LP-IR SCORE: 50 — AB (ref <=45)
SMALL LDL PARTICLE NUMBER: 552 nmol/L — AB (ref <=527)
Triglycerides by NMR: 146 mg/dL (ref <150)

## 2013-05-08 LAB — VITAMIN D 25 HYDROXY (VIT D DEFICIENCY, FRACTURES): Vit D, 25-Hydroxy: 24.6 ng/mL — ABNORMAL LOW (ref 30.0–100.0)

## 2013-05-08 NOTE — Telephone Encounter (Signed)
Closed encounter °

## 2013-05-08 NOTE — Telephone Encounter (Signed)
called- aware of all labs

## 2013-05-09 LAB — URINE CULTURE

## 2013-05-13 ENCOUNTER — Ambulatory Visit (HOSPITAL_COMMUNITY)
Admission: RE | Admit: 2013-05-13 | Discharge: 2013-05-13 | Disposition: A | Discharge status: Home / Self Care | Payer: Medicare Other | Source: Ambulatory Visit | Attending: Family Medicine | Admitting: Family Medicine

## 2013-05-13 DIAGNOSIS — N1330 Unspecified hydronephrosis: Secondary | ICD-10-CM | POA: Insufficient documentation

## 2013-05-13 DIAGNOSIS — N289 Disorder of kidney and ureter, unspecified: Secondary | ICD-10-CM | POA: Insufficient documentation

## 2013-05-13 DIAGNOSIS — R109 Unspecified abdominal pain: Secondary | ICD-10-CM

## 2013-05-13 DIAGNOSIS — R9389 Abnormal findings on diagnostic imaging of other specified body structures: Secondary | ICD-10-CM | POA: Insufficient documentation

## 2013-05-14 ENCOUNTER — Other Ambulatory Visit: Payer: Self-pay | Admitting: Family Medicine

## 2013-05-14 ENCOUNTER — Other Ambulatory Visit: Payer: Medicare Other

## 2013-05-14 DIAGNOSIS — N390 Urinary tract infection, site not specified: Secondary | ICD-10-CM

## 2013-05-14 DIAGNOSIS — Z1212 Encounter for screening for malignant neoplasm of rectum: Secondary | ICD-10-CM

## 2013-05-14 NOTE — Addendum Note (Signed)
Addended by: Wyline Mood on: 05/14/2013 05:36 PM   Modules accepted: Orders

## 2013-05-14 NOTE — Progress Notes (Signed)
Patient dropped off urine specimen

## 2013-05-15 ENCOUNTER — Other Ambulatory Visit: Payer: Self-pay | Admitting: *Deleted

## 2013-05-15 DIAGNOSIS — N200 Calculus of kidney: Secondary | ICD-10-CM | POA: Diagnosis not present

## 2013-05-15 DIAGNOSIS — N201 Calculus of ureter: Secondary | ICD-10-CM | POA: Diagnosis not present

## 2013-05-15 DIAGNOSIS — N1330 Unspecified hydronephrosis: Secondary | ICD-10-CM

## 2013-05-15 LAB — SPECIMEN STATUS REPORT

## 2013-05-15 LAB — URINE CULTURE

## 2013-05-16 ENCOUNTER — Other Ambulatory Visit: Payer: Self-pay | Admitting: Urology

## 2013-05-16 LAB — FECAL OCCULT BLOOD, IMMUNOCHEMICAL: Fecal Occult Bld: POSITIVE — AB

## 2013-05-16 NOTE — Progress Notes (Signed)
Need orders in EPIC.  Surgery scheduled for 05/20/13.  Thank You.

## 2013-05-19 ENCOUNTER — Ambulatory Visit (HOSPITAL_COMMUNITY)
Admission: RE | Admit: 2013-05-19 | Discharge: 2013-05-19 | Disposition: A | Discharge status: Home / Self Care | Payer: Medicare Other | Source: Ambulatory Visit | Attending: Urology | Admitting: Urology

## 2013-05-19 ENCOUNTER — Encounter (HOSPITAL_COMMUNITY): Payer: Self-pay | Admitting: Anesthesiology

## 2013-05-19 ENCOUNTER — Encounter (HOSPITAL_COMMUNITY): Payer: Self-pay

## 2013-05-19 ENCOUNTER — Encounter (HOSPITAL_COMMUNITY)
Admission: RE | Admit: 2013-05-19 | Discharge: 2013-05-19 | Disposition: A | Discharge status: Home / Self Care | Payer: Medicare Other | Source: Ambulatory Visit | Attending: Urology | Admitting: Urology

## 2013-05-19 ENCOUNTER — Encounter (HOSPITAL_COMMUNITY): Payer: Self-pay | Admitting: Pharmacy Technician

## 2013-05-19 DIAGNOSIS — E7800 Pure hypercholesterolemia, unspecified: Secondary | ICD-10-CM | POA: Diagnosis not present

## 2013-05-19 DIAGNOSIS — Z95810 Presence of automatic (implantable) cardiac defibrillator: Secondary | ICD-10-CM | POA: Diagnosis not present

## 2013-05-19 DIAGNOSIS — I739 Peripheral vascular disease, unspecified: Secondary | ICD-10-CM | POA: Diagnosis not present

## 2013-05-19 DIAGNOSIS — Z7982 Long term (current) use of aspirin: Secondary | ICD-10-CM | POA: Diagnosis not present

## 2013-05-19 DIAGNOSIS — I4891 Unspecified atrial fibrillation: Secondary | ICD-10-CM | POA: Diagnosis not present

## 2013-05-19 DIAGNOSIS — N183 Chronic kidney disease, stage 3 unspecified: Secondary | ICD-10-CM | POA: Diagnosis not present

## 2013-05-19 DIAGNOSIS — I252 Old myocardial infarction: Secondary | ICD-10-CM | POA: Diagnosis not present

## 2013-05-19 DIAGNOSIS — E669 Obesity, unspecified: Secondary | ICD-10-CM | POA: Diagnosis not present

## 2013-05-19 DIAGNOSIS — R05 Cough: Secondary | ICD-10-CM | POA: Diagnosis not present

## 2013-05-19 DIAGNOSIS — R059 Cough, unspecified: Secondary | ICD-10-CM | POA: Diagnosis not present

## 2013-05-19 DIAGNOSIS — N201 Calculus of ureter: Secondary | ICD-10-CM | POA: Diagnosis not present

## 2013-05-19 DIAGNOSIS — F411 Generalized anxiety disorder: Secondary | ICD-10-CM | POA: Diagnosis not present

## 2013-05-19 DIAGNOSIS — I129 Hypertensive chronic kidney disease with stage 1 through stage 4 chronic kidney disease, or unspecified chronic kidney disease: Secondary | ICD-10-CM | POA: Diagnosis not present

## 2013-05-19 DIAGNOSIS — N1330 Unspecified hydronephrosis: Secondary | ICD-10-CM | POA: Diagnosis not present

## 2013-05-19 DIAGNOSIS — Z79899 Other long term (current) drug therapy: Secondary | ICD-10-CM | POA: Diagnosis not present

## 2013-05-19 DIAGNOSIS — I2510 Atherosclerotic heart disease of native coronary artery without angina pectoris: Secondary | ICD-10-CM | POA: Diagnosis not present

## 2013-05-19 HISTORY — DX: Unspecified macular degeneration: H35.30

## 2013-05-19 HISTORY — DX: Peripheral vascular disease, unspecified: I73.9

## 2013-05-19 LAB — BASIC METABOLIC PANEL
BUN: 36 mg/dL — ABNORMAL HIGH (ref 6–23)
CO2: 22 mEq/L (ref 19–32)
CREATININE: 2.64 mg/dL — AB (ref 0.50–1.10)
Calcium: 9.5 mg/dL (ref 8.4–10.5)
Chloride: 101 mEq/L (ref 96–112)
GFR, EST AFRICAN AMERICAN: 19 mL/min — AB (ref >90)
GFR, EST NON AFRICAN AMERICAN: 16 mL/min — AB (ref >90)
Glucose, Bld: 114 mg/dL — ABNORMAL HIGH (ref 70–99)
Potassium: 4.3 mEq/L (ref 3.7–5.3)
Sodium: 138 mEq/L (ref 137–147)

## 2013-05-19 NOTE — Progress Notes (Signed)
Need orders in EPIC.  Surgery on 05/20/2013.  Thank You.

## 2013-05-19 NOTE — Progress Notes (Signed)
Perioperative ICD orders faxed to Dr Loleta Chance on 05/16/2013 and 05/19/2013 to be completed.

## 2013-05-19 NOTE — Progress Notes (Signed)
EKG in EPIC ECHO 01/01/12- EPIC  Stress TEst 04/25/12 EPIC  DDR Croituri _LOV- 05/01/13 EPIC  Defib approaching elective replacement in 3-6 ,pmths per office visit note of 05/01/13.  Device check 05/09/14 EPIC

## 2013-05-19 NOTE — Anesthesia Preprocedure Evaluation (Addendum)
Anesthesia Evaluation  Patient identified by MRN, date of birth, ID band Patient awake    Reviewed: Allergy & Precautions, H&P , NPO status , Patient's Chart, lab work & pertinent test results  Airway Mallampati: II TM Distance: >3 FB Neck ROM: Full    Dental  (+) Edentulous Upper, Edentulous Lower   Pulmonary neg pulmonary ROS,  breath sounds clear to auscultation  Pulmonary exam normal       Cardiovascular hypertension, Pt. on medications and Pt. on home beta blockers + CAD, + Past MI and + Peripheral Vascular Disease negative cardio ROS  + dysrhythmias + Cardiac Defibrillator Rhythm:Regular Rate:Normal     Neuro/Psych negative neurological ROS  negative psych ROS   GI/Hepatic Neg liver ROS, hiatal hernia, GERD-  Medicated,  Endo/Other  negative endocrine ROS  Renal/GU Renal InsufficiencyRenal diseaseChronic renal insufficiency, stage 3  negative genitourinary   Musculoskeletal negative musculoskeletal ROS (+)   Abdominal (+) + obese,   Peds negative pediatric ROS (+)  Hematology negative hematology ROS (+)   Anesthesia Other Findings   Reproductive/Obstetrics negative OB ROS                          Anesthesia Physical Anesthesia Plan  ASA: III  Anesthesia Plan: General   Post-op Pain Management:    Induction: Intravenous  Airway Management Planned: LMA  Additional Equipment:   Intra-op Plan:   Post-operative Plan: Extubation in OR  Informed Consent: I have reviewed the patients History and Physical, chart, labs and discussed the procedure including the risks, benefits and alternatives for the proposed anesthesia with the patient or authorized representative who has indicated his/her understanding and acceptance.   Dental advisory given  Plan Discussed with: CRNA  Anesthesia Plan Comments:         Anesthesia Quick Evaluation

## 2013-05-19 NOTE — Progress Notes (Signed)
Patient takes Norpace and Metoprolol at 1000am and 2200pm.  On 05/19/2013 pm patient will takes pm dose of Metoprolol and Norpace at approximately 2030pm and will bring her Norpace and Metoprolol with her and take when she arrives with sip of water to space doses .

## 2013-05-19 NOTE — H&P (Signed)
[facility] For Visit  [name] presents today with right hydronephrosis   History of Present Illness      [name] has a  history of nephrolithiasis.  CT in 2013 confirmed bilateral non obstructing stones.   She has known renal insufficiency.   Interval history: [name] presents today at the request of her PCP.  She has had right sided back and abdominal pain for at least 2 weeks.  An ultrasound was performed 2 days ago as he was concerned about her gallbladder.  This revealed right hydronephrosis thus he told her to follow up here.  She is doing okay today.  She does have intermittent pain relieved with tylenol and a heating pad.  Her pain is associated with bladder pressure and urgency.  She denies gross hematuria, n/v or f/c.   Past Medical History Problems   1. History of Abdominal pain, RLQ (right lower quadrant) (789.03)  2. History of Acute Myocardial Infarction (V12.59)  3. History of Anxiety (300.00)  4. History of Atrial fibrillation (427.31)  5. History of Bladder pain (788.99)  6. History of Disorder of heart rhythm (427.9)  7. History of Dysuria (788.1)  8. History of cardiac disorder (V12.50)  9. History of chronic cystitis (V13.00)  10. History of congestive heart disease (V12.59)  11. History of heartburn (V12.79)  12. History of hypercholesterolemia (V12.29)  13. History of hypertension (V12.59)  14. History of kidney stones (V13.01)  Surgical History Problems   1. History of Excision Of External Thrombosed Hemorrhoids  2. History of Implantable Cardioverter-Defibrillator  Current Meds  1. Aspirin 81 MG Oral Tablet;  Therapy: (Recorded:04Nov2009) to Recorded  2. Centrum Silver TABS;  Therapy: (Recorded:04Nov2009) to Recorded  3. Citracal + D TABS;  Therapy: (Recorded:05Jul2013) to Recorded  4. Crestor 10 MG Oral Tablet;  Therapy: (Recorded:04Nov2009) to Recorded  5. Demadex 20 MG Oral Tablet;  Therapy: (Recorded:04Nov2009) to Recorded  6. Metoprolol  Tartrate 50 MG Oral Tablet;  Therapy: (Recorded:04Nov2009) to Recorded  7. Norpace CR 100 MG Oral Capsule Extended Release 12 Hour;  Therapy: (Recorded:04Nov2009) to Recorded  8. Paxil 20 MG Oral Tablet;  Therapy: (Recorded:04Nov2009) to Recorded  9. Vitamin D 50000 UNIT CAPS;  Therapy: (Recorded:14Jul2010) to Recorded  10. Zestril 10 MG Oral Tablet;   Therapy: (Recorded:04Nov2009) to Recorded  Allergies Non-Medication   1. Contrast Dye  Family History Problems   1. Family history of Death In The Family Father : Father   age 69; heart failure  2. Family history of Death In The Family Mother : Mother   age 67; stroke  3. Family history of Family Health Status Number Of Children   3 sons; 1 daughter  Social History Problems   1. Denied: History of Alcohol Use  2. Denied: History of Caffeine Use  3. Marital History - Widowed  4. Occupation:   housewife  5. Denied: History of Tobacco Use  Review of Systems Genitourinary, constitutional and gastrointestinal system(s) were reviewed and pertinent findings if present are noted.  Genitourinary: urinary urgency.  Gastrointestinal: nausea, flank pain and abdominal pain.    Vitals Vital Signs [Data Includes: Last 1 Day]  Recorded: 30Apr2015 03:41PM  Blood Pressure: 137 / 73 Temperature: 97.9 F Heart Rate: 64  Physical Exam Constitutional: Well nourished and well developed . No acute distress.  Pulmonary: No respiratory distress and normal respiratory rhythm and effort.  Abdomen: The abdomen is soft and nontender. No CVA tenderness.    Results/Data Urine [Data Includes: Last 1   Day]   30Apr2015  COLOR YELLOW   APPEARANCE CLEAR   SPECIFIC GRAVITY 1.025   pH 6.0   GLUCOSE NEG mg/dL  BILIRUBIN NEG   KETONE NEG mg/dL  BLOOD MOD   PROTEIN NEG mg/dL  UROBILINOGEN 0.2 mg/dL  NITRITE NEG   LEUKOCYTE ESTERASE SMALL   SQUAMOUS EPITHELIAL/HPF RARE   WBC 11-20 WBC/hpf  RBC 3-6 RBC/hpf  BACTERIA RARE   CRYSTALS NONE SEEN    CASTS NONE SEEN    The following images/tracing/specimen were independently visualized: .    KUB: small bilateral non obstructing stones and what appear to be at least 2 large mid ureteral stones on the right, no obvious left ureteral stones seen, defibrillator seen    Assessment Assessed   1. Calculus of right ureter (592.1)   Per KUB it appears [name] has two large right mid ureteral stones.  These are at least 7 mm.  I told her I do not think she will be able to pass these.  We discussed ESWL vs ureteroscopy today. I am going to defer this decision to Dr. Malisha Mabey due to the fact she has a defibrillator as I am not sure she is a candidate for ESWL. She is pretty asymptomatic at this time.  I will give her tamsulosin to take daily in the meantime and she will strain her urine.  I told her again I am not sure she will pass these stones but certainly if she does she will let us know. She will also call if she has pain or n/v uncontrolled with medication or a temp of 100.5 or greater. She reportedly has pain and nausea medication at home from Dr. Moore.   Plan Bilateral kidney stones   1. Start: Tamsulosin HCl - 0.4 MG Oral Capsule; TAKE 1 CAPSULE Daily  2. KUB; Status:Complete;   Done: 30Apr2015 12:00AM Health Maintenance   3. UA With REFLEX; [Do Not Release]; Status:Complete;   Done: 30Apr2015 03:19PM   1. Tamsulosin for possible MET of right ureteral stones 2. Send KUB to Dr. Jerel Sardina for MD Advice on treatment   

## 2013-05-19 NOTE — Patient Instructions (Signed)
BAMBIE MCRAE  05/19/2013   Your procedure is scheduled on:  05/20/2013 0730am-0830am  Report to [facility] at     [place]  AM.  Call this number if you have problems the morning of surgery: [phone]   Remember:   Do not eat food or drink liquids after midnight.   Take these medicines the morning of surgery with A SIP OF WATER:    Do not wear jewelry, make-up or nail polish.  Do not wear lotions, powders, or perfumes. You may wear deodorant.  Do not shave 48 hours prior to surgery.  Do not bring valuables to the hospital.  Contacts, dentures or bridgework may not be worn into surgery.     Patients discharged the day of surgery will not be allowed to drive  home.  Name and phone number of your driver:      [facility] - Preparing for Surgery Before surgery, you can play an important role.  Because skin is not sterile, your skin needs to be as free of germs as possible.  You can reduce the number of germs on your skin by washing with CHG (chlorahexidine gluconate) soap before surgery.  CHG is an antiseptic cleaner which kills germs and bonds with the skin to continue killing germs even after washing. Please DO NOT use if you have an allergy to CHG or antibacterial soaps.  If your skin becomes reddened/irritated stop using the CHG and inform your nurse when you arrive at Short Stay. Do not shave (including legs and underarms) for at least 48 hours prior to the first CHG shower.  You may shave your face. Please follow these instructions carefully:  1.  Shower with CHG Soap the night before surgery and the  morning of Surgery.  2.  If you choose to wash your hair, wash your hair first as usual with your  normal  shampoo.  3.  After you shampoo, rinse your hair and body thoroughly to remove the  shampoo.                           4.  Use CHG as you would any other liquid soap.  You can apply chg directly  to the skin and wash                       Gently with a scrungie or  clean washcloth.  5.  Apply the CHG Soap to your body ONLY FROM THE NECK DOWN.   Do not use on open                           Wound or open sores. Avoid contact with eyes, ears mouth and genitals (private parts).                        Genitals (private parts) with your normal soap.             6.  Wash thoroughly, paying special attention to the area where your surgery  will be performed.  7.  Thoroughly rinse your body with warm water from the neck down.  8.  DO NOT shower/wash with your normal soap after using and rinsing off  the CHG Soap.                9.  Pat yourself dry with a  clean towel.            10.  Wear clean pajamas.            11.  Place clean sheets on your bed the night of your first shower and do not  sleep with pets. Day of Surgery : Do not apply any lotions/deodorants the morning of surgery.  Please wear clean clothes to the hospital/surgery center.  FAILURE TO FOLLOW THESE INSTRUCTIONS MAY RESULT IN THE CANCELLATION OF YOUR SURGERY PATIENT SIGNATURE_________________________________  NURSE SIGNATURE__________________________________  ________________________________________________________________________

## 2013-05-19 NOTE — Progress Notes (Signed)
BMP results faxed via EPIC to Dr Jeffie Pollock.

## 2013-05-20 ENCOUNTER — Encounter (HOSPITAL_COMMUNITY): Payer: Medicare Other | Admitting: Anesthesiology

## 2013-05-20 ENCOUNTER — Encounter (HOSPITAL_COMMUNITY): Payer: Self-pay | Admitting: *Deleted

## 2013-05-20 ENCOUNTER — Encounter (HOSPITAL_COMMUNITY)
Admission: RE | Disposition: A | Discharge status: Home / Self Care | Payer: Self-pay | Source: Ambulatory Visit | Attending: Urology

## 2013-05-20 ENCOUNTER — Ambulatory Visit (HOSPITAL_COMMUNITY)
Admission: RE | Admit: 2013-05-20 | Discharge: 2013-05-20 | Disposition: A | Discharge status: Home / Self Care | Payer: Medicare Other | Source: Ambulatory Visit | Attending: Urology | Admitting: Urology

## 2013-05-20 ENCOUNTER — Ambulatory Visit (HOSPITAL_COMMUNITY): Payer: Medicare Other | Admitting: Anesthesiology

## 2013-05-20 DIAGNOSIS — E669 Obesity, unspecified: Secondary | ICD-10-CM | POA: Insufficient documentation

## 2013-05-20 DIAGNOSIS — N132 Hydronephrosis with renal and ureteral calculous obstruction: Secondary | ICD-10-CM | POA: Diagnosis present

## 2013-05-20 DIAGNOSIS — I10 Essential (primary) hypertension: Secondary | ICD-10-CM | POA: Diagnosis not present

## 2013-05-20 DIAGNOSIS — N183 Chronic kidney disease, stage 3 unspecified: Secondary | ICD-10-CM | POA: Insufficient documentation

## 2013-05-20 DIAGNOSIS — N201 Calculus of ureter: Secondary | ICD-10-CM | POA: Diagnosis not present

## 2013-05-20 DIAGNOSIS — I129 Hypertensive chronic kidney disease with stage 1 through stage 4 chronic kidney disease, or unspecified chronic kidney disease: Secondary | ICD-10-CM | POA: Insufficient documentation

## 2013-05-20 DIAGNOSIS — I252 Old myocardial infarction: Secondary | ICD-10-CM | POA: Insufficient documentation

## 2013-05-20 DIAGNOSIS — F411 Generalized anxiety disorder: Secondary | ICD-10-CM | POA: Insufficient documentation

## 2013-05-20 DIAGNOSIS — Z95810 Presence of automatic (implantable) cardiac defibrillator: Secondary | ICD-10-CM | POA: Insufficient documentation

## 2013-05-20 DIAGNOSIS — I4891 Unspecified atrial fibrillation: Secondary | ICD-10-CM | POA: Insufficient documentation

## 2013-05-20 DIAGNOSIS — E7800 Pure hypercholesterolemia, unspecified: Secondary | ICD-10-CM | POA: Insufficient documentation

## 2013-05-20 DIAGNOSIS — K219 Gastro-esophageal reflux disease without esophagitis: Secondary | ICD-10-CM | POA: Diagnosis not present

## 2013-05-20 DIAGNOSIS — Z79899 Other long term (current) drug therapy: Secondary | ICD-10-CM | POA: Insufficient documentation

## 2013-05-20 DIAGNOSIS — N1330 Unspecified hydronephrosis: Secondary | ICD-10-CM | POA: Diagnosis not present

## 2013-05-20 DIAGNOSIS — Z7982 Long term (current) use of aspirin: Secondary | ICD-10-CM | POA: Insufficient documentation

## 2013-05-20 DIAGNOSIS — I2510 Atherosclerotic heart disease of native coronary artery without angina pectoris: Secondary | ICD-10-CM | POA: Insufficient documentation

## 2013-05-20 DIAGNOSIS — I739 Peripheral vascular disease, unspecified: Secondary | ICD-10-CM | POA: Insufficient documentation

## 2013-05-20 HISTORY — PX: HOLMIUM LASER APPLICATION: SHX5852

## 2013-05-20 HISTORY — PX: CYSTOSCOPY WITH URETEROSCOPY: SHX5123

## 2013-05-20 SURGERY — CYSTOSCOPY WITH URETEROSCOPY
Anesthesia: General | Laterality: Right

## 2013-05-20 MED ORDER — HYDROCODONE-ACETAMINOPHEN 5-325 MG PO TABS: 1.0000 | ORAL_TABLET | Freq: Four times a day (QID) | ORAL | Status: DC | PRN

## 2013-05-20 MED ORDER — OXYCODONE HCL 5 MG PO TABS: 5.0000 mg | ORAL_TABLET | ORAL | Status: DC | PRN

## 2013-05-20 MED ORDER — SODIUM CHLORIDE 0.9 % IJ SOLN: 3.0000 mL | Freq: Two times a day (BID) | INTRAMUSCULAR | Status: DC

## 2013-05-20 MED ORDER — ACETAMINOPHEN 325 MG PO TABS
650.0000 mg | ORAL_TABLET | ORAL | Status: DC | PRN
Start: 2013-05-20 — End: 2013-05-20

## 2013-05-20 MED ORDER — CIPROFLOXACIN IN D5W 400 MG/200ML IV SOLN
400.0000 mg | INTRAVENOUS | Status: AC
  Administered 2013-05-20: 400 mg via INTRAVENOUS

## 2013-05-20 MED ORDER — PHENYLEPHRINE 40 MCG/ML (10ML) SYRINGE FOR IV PUSH (FOR BLOOD PRESSURE SUPPORT)
PREFILLED_SYRINGE | INTRAVENOUS | Status: AC
  Filled 2013-05-20: qty 10

## 2013-05-20 MED ORDER — PROMETHAZINE HCL 25 MG/ML IJ SOLN
6.2500 mg | INTRAMUSCULAR | Status: AC | PRN
  Administered 2013-05-20 (×2): 6.25 mg via INTRAVENOUS

## 2013-05-20 MED ORDER — ACETAMINOPHEN 650 MG RE SUPP
650.0000 mg | RECTAL | Status: DC | PRN
Start: 2013-05-20 — End: 2013-05-20
  Filled 2013-05-20: qty 1

## 2013-05-20 MED ORDER — LIDOCAINE HCL (CARDIAC) 20 MG/ML IV SOLN
INTRAVENOUS | Status: AC
  Filled 2013-05-20: qty 5

## 2013-05-20 MED ORDER — SODIUM CHLORIDE 0.9 % IJ SOLN
INTRAMUSCULAR | Status: AC
  Filled 2013-05-20: qty 10

## 2013-05-20 MED ORDER — FENTANYL CITRATE 0.05 MG/ML IJ SOLN
INTRAMUSCULAR | Status: DC | PRN
  Administered 2013-05-20 (×2): 50 ug via INTRAVENOUS

## 2013-05-20 MED ORDER — PROMETHAZINE HCL 25 MG/ML IJ SOLN
INTRAMUSCULAR | Status: AC
  Filled 2013-05-20: qty 1

## 2013-05-20 MED ORDER — CIPROFLOXACIN IN D5W 400 MG/200ML IV SOLN
INTRAVENOUS | Status: AC
  Filled 2013-05-20: qty 200

## 2013-05-20 MED ORDER — LIDOCAINE HCL 2 % EX GEL
CUTANEOUS | Status: AC
  Filled 2013-05-20: qty 10

## 2013-05-20 MED ORDER — FENTANYL CITRATE 0.05 MG/ML IJ SOLN
25.0000 ug | INTRAMUSCULAR | Status: DC | PRN
Start: 2013-05-20 — End: 2013-05-20

## 2013-05-20 MED ORDER — CIPROFLOXACIN HCL 250 MG PO TABS
250.0000 mg | ORAL_TABLET | Freq: Two times a day (BID) | ORAL | Status: DC
Start: 2013-05-20 — End: 2013-05-29

## 2013-05-20 MED ORDER — FENTANYL CITRATE 0.05 MG/ML IJ SOLN: 25.0000 ug | INTRAMUSCULAR | Status: DC | PRN

## 2013-05-20 MED ORDER — LACTATED RINGERS IV SOLN
INTRAVENOUS | Status: DC
  Administered 2013-05-20: 10:00:00 via INTRAVENOUS

## 2013-05-20 MED ORDER — MIDAZOLAM HCL 2 MG/2ML IJ SOLN
INTRAMUSCULAR | Status: AC
  Filled 2013-05-20: qty 2

## 2013-05-20 MED ORDER — PROPOFOL 10 MG/ML IV BOLUS
INTRAVENOUS | Status: DC | PRN
  Administered 2013-05-20: 100 mg via INTRAVENOUS

## 2013-05-20 MED ORDER — PHENAZOPYRIDINE HCL 100 MG PO TABS: 100.0000 mg | ORAL_TABLET | Freq: Three times a day (TID) | ORAL | Status: DC | PRN

## 2013-05-20 MED ORDER — LIDOCAINE HCL 1 % IJ SOLN
INTRAMUSCULAR | Status: DC | PRN
  Administered 2013-05-20: 80 mg via INTRADERMAL

## 2013-05-20 MED ORDER — MIDAZOLAM HCL 5 MG/5ML IJ SOLN
INTRAMUSCULAR | Status: DC | PRN
  Administered 2013-05-20: 1 mg via INTRAVENOUS

## 2013-05-20 MED ORDER — IOHEXOL 300 MG/ML  SOLN
INTRAMUSCULAR | Status: DC | PRN
  Administered 2013-05-20: 10 mL

## 2013-05-20 MED ORDER — LACTATED RINGERS IV SOLN
INTRAVENOUS | Status: DC | PRN
  Administered 2013-05-20: 07:00:00 via INTRAVENOUS

## 2013-05-20 MED ORDER — PROPOFOL 10 MG/ML IV BOLUS
INTRAVENOUS | Status: AC
  Filled 2013-05-20: qty 20

## 2013-05-20 MED ORDER — PHENYLEPHRINE HCL 10 MG/ML IJ SOLN
INTRAMUSCULAR | Status: DC | PRN
  Administered 2013-05-20 (×2): 40 ug via INTRAVENOUS

## 2013-05-20 MED ORDER — SODIUM CHLORIDE 0.9 % IR SOLN
Status: DC | PRN
  Administered 2013-05-20: 4000 mL via INTRAVESICAL

## 2013-05-20 MED ORDER — SODIUM CHLORIDE 0.9 % IJ SOLN: 3.0000 mL | INTRAMUSCULAR | Status: DC | PRN

## 2013-05-20 MED ORDER — SODIUM CHLORIDE 0.9 % IV SOLN: 250.0000 mL | INTRAVENOUS | Status: DC | PRN

## 2013-05-20 MED ORDER — FENTANYL CITRATE 0.05 MG/ML IJ SOLN
INTRAMUSCULAR | Status: AC
  Filled 2013-05-20: qty 5

## 2013-05-20 SURGICAL SUPPLY — 19 items
BAG URO CATCHER STRL LF (DRAPE) ×3 IMPLANT
BASKET LASER NITINOL 1.9FR (BASKET) ×1 IMPLANT
BASKET ZERO TIP NITINOL 2.4FR (BASKET) ×2 IMPLANT
BSKT STON RTRVL 120 1.9FR (BASKET)
BSKT STON RTRVL ZERO TP 2.4FR (BASKET) ×1
CATH URET 5FR 28IN OPEN ENDED (CATHETERS) ×2 IMPLANT
CLOTH BEACON ORANGE TIMEOUT ST (SAFETY) ×3 IMPLANT
DRAPE CAMERA CLOSED 9X96 (DRAPES) ×3 IMPLANT
FIBER LASER FLEXIVA 365 (UROLOGICAL SUPPLIES) ×2 IMPLANT
GLOVE SURG SS PI 8.0 STRL IVOR (GLOVE) IMPLANT
GOWN STRL REUS W/TWL XL LVL3 (GOWN DISPOSABLE) ×3 IMPLANT
GUIDEWIRE STR DUAL SENSOR (WIRE) ×4 IMPLANT
MANIFOLD NEPTUNE II (INSTRUMENTS) ×3 IMPLANT
PACK CYSTO (CUSTOM PROCEDURE TRAY) ×3 IMPLANT
SHEATH ACCESS URETERAL 38CM (SHEATH) ×3 IMPLANT
SHEATH URET ACCESS 10/12FR (MISCELLANEOUS) ×2 IMPLANT
STENT CONTOUR 6FRX26X.038 (STENTS) ×2 IMPLANT
TUBING CONNECTING 10 (TUBING) ×2 IMPLANT
TUBING CONNECTING 10' (TUBING) ×1

## 2013-05-20 NOTE — Transfer of Care (Signed)
Immediate Anesthesia Transfer of Care Note  Patient: [name]  Procedure(s) Performed: Procedure(s): CYSTOSCOPY, RIGHT URETEROSCOPY STONE EXTRACTION, Insertion of right DOUBLE J STENT  (Right) HOLMIUM LASER APPLICATION (Right)  Patient Location: PACU  Anesthesia Type:General  Level of Consciousness: awake and oriented  Airway & Oxygen Therapy: Patient Spontanous Breathing and Patient connected to face mask oxygen  Post-op Assessment: Report given to PACU RN and Post -op Vital signs reviewed and stable  Post vital signs: Reviewed and stable  Complications: No apparent anesthesia complications

## 2013-05-20 NOTE — Progress Notes (Signed)
  Dr Jeffie Pollock paged re: when to resume aspirin.  May resume aspirin in 48 hours

## 2013-05-20 NOTE — Interval H&P Note (Signed)
History and Physical Interval Note:  05/20/2013 7:25 AM  [name]  has presented today for surgery, with the diagnosis of RIGHT URETERAL STONE   The various methods of treatment have been discussed with the patient and family. After consideration of risks, benefits and other options for treatment, the patient has consented to  Procedure(s): CYSTOSCOPY WITH RIGHT URETEROSCOPY STONE EXTRACTION POSSIBLE DOUBLE J STENT  (Right) POSSIBLE HOLMIUM LASER APPLICATION (Right) as a surgical intervention .  The patient's history has been reviewed, patient examined, no change in status, stable for surgery.  I have reviewed the patient's chart and labs.  Questions were answered to the patient's satisfaction.     Irine Seal

## 2013-05-20 NOTE — Discharge Instructions (Signed)
Ureteral Stent Implantation, Care After Refer to this sheet in the next few weeks. These instructions provide you with information on caring for yourself after your procedure. Your health care provider may also give you more specific instructions. Your treatment has been planned according to current medical practices, but problems sometimes occur. Call your health care provider if you have any problems or questions after your procedure. WHAT TO EXPECT AFTER THE PROCEDURE You should be back to normal activity within 48 hours after the procedure. Nausea and vomiting may occur and are commonly the result of anesthesia. It is common to experience sharp pain in the back or lower abdomen and penis with voiding. This is caused by movement of the ends of the stent with the act of urinating.It usually goes away within minutes after you have stopped urinating. HOME CARE INSTRUCTIONS Make sure to drink plenty of fluids. You may have small amounts of bleeding, causing your urine to be red. This is normal. Certain movements may trigger pain or a feeling that you need to urinate. You may be given medications to prevent infection or bladder spasms. Be sure to take all medications as directed. Only take over-the-counter or prescription medicines for pain, discomfort, or fever as directed by your health care provider. Do not take aspirin, as this can make bleeding worse. Your stent will be left in until the blockage is resolved. This may take 2 weeks or longer, depending on the reason for stent implantation. You may have an X-ray exam to make sure your ureter is open and that the stent has not moved out of position (migrated). The stent can be removed by your health care provider in the office. Medications may be given for comfort while the stent is being removed. Be sure to keep all follow-up appointments so your health care provider can check that you are healing properly. SEEK MEDICAL CARE IF:  You experience  increasing pain.  Your pain medication is not working. SEEK IMMEDIATE MEDICAL CARE IF:  Your urine is dark red or has blood clots.  You are leaking urine (incontinent).  You have a fever, chills, feeling sick to your stomach (nausea), or vomiting.  Your pain is not relieved by pain medication.  The end of the stent comes out of the urethra.  You are unable to urinate. Document Released: 09/04/2012 Document Reviewed: 09/04/2012 [facility] Patient Information 2014 [place].

## 2013-05-20 NOTE — Anesthesia Postprocedure Evaluation (Signed)
  Anesthesia Post-op Note  Patient: [name]  Procedure(s) Performed: Procedure(s) (LRB): CYSTOSCOPY, RIGHT URETEROSCOPY STONE EXTRACTION, Insertion of right DOUBLE J STENT  (Right) HOLMIUM LASER APPLICATION (Right)  Patient Location: PACU  Anesthesia Type: General  Level of Consciousness: awake and alert   Airway and Oxygen Therapy: Patient Spontanous Breathing  Post-op Pain: mild  Post-op Assessment: Post-op Vital signs reviewed, Patient's Cardiovascular Status Stable, Respiratory Function Stable, Patent Airway and No signs of Nausea or vomiting  Last Vitals:  Filed Vitals:   05/20/13 1120  BP: 171/80  Pulse: 66  Temp: 36.5 C  Resp: 18    Post-op Vital Signs: stable   Complications: No apparent anesthesia complications

## 2013-05-20 NOTE — OR Nursing (Signed)
Right ureteral stones sent with Dr. Jeffie Pollock.

## 2013-05-20 NOTE — Brief Op Note (Signed)
05/20/2013  8:43 AM  PATIENT:  [name]  80 y.o. female  PRE-OPERATIVE DIAGNOSIS:  RIGHT PROXIMAL URETERAL STONES   POST-OPERATIVE DIAGNOSIS:  RIGHT PROXIMAL URETERAL STONES   PROCEDURE:  Procedure(s): CYSTOSCOPY, RIGHT URETEROSCOPY STONE EXTRACTION, Insertion of right DOUBLE J STENT  (Right) HOLMIUM LASER APPLICATION (Right)  1st Stage.   SURGEON:  Surgeon(s) and Role:    * Irine Seal, MD - Primary  PHYSICIAN ASSISTANT:   ASSISTANTS: none   ANESTHESIA:   general  EBL:  Total I/O In: 900 [I.V.:900] Out: -   BLOOD ADMINISTERED:none  DRAINS: 6 x 26 right JJ stent   LOCAL MEDICATIONS USED:  NONE  SPECIMEN:  Source of Specimen:  ureteral stone fragments  DISPOSITION OF SPECIMEN:  to family  COUNTS:  YES  TOURNIQUET:  * No tourniquets in log *  DICTATION: .Other Dictation: Dictation Number [id number]  PLAN OF CARE: Discharge to home after PACU  PATIENT DISPOSITION:  PACU - hemodynamically stable.   Delay start of Pharmacological VTE agent (>24hrs) due to surgical blood loss or risk of bleeding: not applicable

## 2013-05-21 NOTE — Op Note (Signed)
[name], [name] NO.:  [id number]  MEDICAL RECORD NO.:  [id number]  LOCATION:  [facility]                         FACILITY:  [facility]  PHYSICIAN:  Marshall Cork. Jeffie Pollock, M.D.    DATE OF BIRTH:  [birth year]  DATE OF PROCEDURE:  05/20/2013 DATE OF DISCHARGE:  05/20/2013                              OPERATIVE REPORT   PROCEDURE:  Cystoscopy, right retrograde pyelogram, right ureteroscopic stone extraction with holmium laser lithotripsy and stent.  This is a first stage ureteroscopy.  PREOPERATIVE DIAGNOSIS:  Multiple right proximal ureteral stones.  POSTOPERATIVE DIAGNOSIS:  Multiple right proximal ureteral stones.  SURGEON:  Marshall Cork. Jeffie Pollock, M.D.  ANESTHESIA:  General.  SPECIMEN:  Stone fragments.  DRAINS:  A 6-French 26-cm double-J stent without string.  BLOOD LOSS:  Minimal.  COMPLICATIONS:  None.  INDICATIONS:  Ms. Scarsella is a 80 year old white female with symptomatic cluster of right proximal ureteral stones that measures approximately 7 x 12 mm.  She has elected ureteroscopic extraction for removal.  She does have renal insufficiency with obstruction from the stones.  FINDINGS OF PROCEDURE:  She was given Cipro.  She was taken to the operating room where general anesthetic was induced.  She was placed in lithotomy position.  Her perineum and genitalia were prepped with Betadine solution.  She was draped in usual sterile fashion.  Cystoscopy was performed using a 22-French scope and a 12-degree lens. Examination revealed normal urethra.  The bladder wall had mild trabeculation.  No tumors, stones, or inflammation were noted.  Ureteral orifices were unremarkable.  The right ureteral orifice was cannulated with a 5-French open-end catheter and contrast was instilled gently.  She does have contrast allergies, so excess contrast was not used.  I mainly wanted to define the distal ureteral anatomy.  There was some narrowing in the upper distal to mid ureter with a  fusiform dilation above this and a filling defect consistent with a stone in the mid ureter.  Additionally, there was a filling defect more proximally consistent with a cluster stones that were noted on prior KUB.  There was proximal dilation, although full filling of the proximal collecting system was not achieved.  At this point, a guidewire was passed to the kidney.  An open-end catheter was removed.  The 12-French inner core of a ureteral access sheath was used to attempt to dilate the ureter.  I was able to get this to just below the level of the proximal stones, but not beyond.  At this point, the access sheath was removed and the 6.4 semirigid ureteroscope was passed alongside the wire.  While it was tied, I was able to advance the scope to the level of the stone.  Initially, I believe the mid ureteral stone flushed back to the level of the proximal stones.  Once the stones were visualized, a 365 micron laser fiber was inserted.  This was initially set on 0.5 watts and 20 hertz and the stones were engaged and broken into and fragmented readily.  Once adequate fragmentation had been performed, a Nitinol basket was used to retrieve several of the fragments.  However, on fluoroscopy, there appeared to be some fragments that had flushed  back to the level of the renal pelvis.  Once several of the proximal fragments removed on retrieval of the scope, I noticed that there was another large stone which was consistent with a mid ureteral stone seen on initial retrograde.  This was then engaged with the laser at 1 watts and 20 hertz and broken into manageable fragments, which were then removed.  At this point, the ureteroscope was advanced as far proximally as possible, but I was not able to get to the level of the stones in the renal pelvis because of downward angulation of the collecting system.  I then it passed a 12/10 ureteral access sheath, but this would not go by the mid  ureteral level.  An attempt was made to advance the digital scope through the sheath, but it was not successful due to a tight spot in the BN.  I then advanced the 12/14 access sheath over the wire.  Once again, could not advance beyond the mid ureter, but the wire and inner core removed and the digital flexible scope was advanced, but I could not get it beyond the narrow spot in the lower proximal ureter.  At this point, the guidewire was reinserted to the kidney.  The access sheath was removed.  The cystoscope was reinserted over the access sheath, and a 6-French 26 cm contour double-J stent without string was advanced to the kidney under fluoroscopic guidance.  The wire was removed, leaving good coil in the kidney and a good coil in the bladder. The bladder was then drained evacuating the stone fragments.  This was performed.  The patient was taken down from a lithotomy position.  Her anesthetic was reversed.  She was moved to the recovery room in stable condition.  There were no complications.  The stone fragments were given to the family to bring the office for analysis.  She will likely need a second procedure to retrieve the residual stones.  This is a first stage ureteroscopy.     Marshall Cork. Jeffie Pollock, M.D.     JJW/MEDQ  D:  05/20/2013  T:  05/20/2013  Job:  [id number]

## 2013-05-22 ENCOUNTER — Encounter (HOSPITAL_COMMUNITY): Payer: Self-pay | Admitting: Urology

## 2013-05-22 ENCOUNTER — Telehealth: Payer: Self-pay | Admitting: Family Medicine

## 2013-05-23 NOTE — Telephone Encounter (Signed)
Patient aware.

## 2013-05-26 ENCOUNTER — Encounter: Payer: Medicare Other | Admitting: Cardiovascular Disease

## 2013-05-28 ENCOUNTER — Other Ambulatory Visit (HOSPITAL_COMMUNITY): Payer: Self-pay | Admitting: Anesthesiology

## 2013-05-28 ENCOUNTER — Encounter (HOSPITAL_COMMUNITY): Payer: Self-pay | Admitting: *Deleted

## 2013-05-28 ENCOUNTER — Telehealth: Payer: Self-pay | Admitting: *Deleted

## 2013-05-28 ENCOUNTER — Other Ambulatory Visit: Payer: Self-pay | Admitting: Urology

## 2013-05-28 DIAGNOSIS — N200 Calculus of kidney: Secondary | ICD-10-CM | POA: Diagnosis not present

## 2013-05-28 DIAGNOSIS — N201 Calculus of ureter: Secondary | ICD-10-CM | POA: Diagnosis not present

## 2013-05-28 NOTE — Progress Notes (Signed)
Need orders in epic for 06-03-13 surgery

## 2013-05-28 NOTE — Telephone Encounter (Signed)
Signed perioperative order for implanted cardiac device programming to [facility] faxed.

## 2013-05-29 ENCOUNTER — Encounter (HOSPITAL_COMMUNITY): Payer: Self-pay | Admitting: Pharmacy Technician

## 2013-05-29 ENCOUNTER — Other Ambulatory Visit (INDEPENDENT_AMBULATORY_CARE_PROVIDER_SITE_OTHER): Payer: Medicare Other

## 2013-05-29 DIAGNOSIS — R5383 Other fatigue: Secondary | ICD-10-CM

## 2013-05-29 DIAGNOSIS — R5381 Other malaise: Secondary | ICD-10-CM | POA: Diagnosis not present

## 2013-05-29 LAB — POCT CBC
Granulocyte percent: 55.5 %G (ref 37–80)
HEMATOCRIT: 37 % — AB (ref 37.7–47.9)
HEMOGLOBIN: 11.4 g/dL — AB (ref 12.2–16.2)
Lymph, poc: 2.3 (ref 0.6–3.4)
MCH: 29.2 pg (ref 27–31.2)
MCHC: 30.8 g/dL — AB (ref 31.8–35.4)
MCV: 94.8 fL (ref 80–97)
MPV: 6.6 fL (ref 0–99.8)
POC Granulocyte: 3.6 (ref 2–6.9)
POC LYMPH PERCENT: 36 %L (ref 10–50)
Platelet Count, POC: 297 10*3/uL (ref 142–424)
RBC: 3.9 M/uL — AB (ref 4.04–5.48)
RDW, POC: 14.4 %
WBC: 6.5 10*3/uL (ref 4.6–10.2)

## 2013-05-29 NOTE — Progress Notes (Signed)
Patient came in for labs only.

## 2013-06-02 DIAGNOSIS — H353290 Exudative age-related macular degeneration, unspecified eye, stage unspecified: Secondary | ICD-10-CM | POA: Diagnosis not present

## 2013-06-02 DIAGNOSIS — H35059 Retinal neovascularization, unspecified, unspecified eye: Secondary | ICD-10-CM | POA: Diagnosis not present

## 2013-06-03 ENCOUNTER — Encounter (HOSPITAL_COMMUNITY): Payer: Self-pay | Admitting: *Deleted

## 2013-06-03 ENCOUNTER — Encounter (HOSPITAL_COMMUNITY)
Admission: RE | Disposition: A | Discharge status: Home / Self Care | Payer: Self-pay | Source: Ambulatory Visit | Attending: Urology

## 2013-06-03 ENCOUNTER — Ambulatory Visit (HOSPITAL_COMMUNITY)
Admission: RE | Admit: 2013-06-03 | Discharge: 2013-06-03 | Disposition: A | Discharge status: Home / Self Care | Payer: Medicare Other | Source: Ambulatory Visit | Attending: Urology | Admitting: Urology

## 2013-06-03 ENCOUNTER — Encounter (HOSPITAL_COMMUNITY): Payer: Medicare Other | Admitting: *Deleted

## 2013-06-03 ENCOUNTER — Ambulatory Visit (HOSPITAL_COMMUNITY): Payer: Medicare Other | Admitting: *Deleted

## 2013-06-03 DIAGNOSIS — N201 Calculus of ureter: Secondary | ICD-10-CM | POA: Insufficient documentation

## 2013-06-03 DIAGNOSIS — N1330 Unspecified hydronephrosis: Secondary | ICD-10-CM | POA: Diagnosis not present

## 2013-06-03 DIAGNOSIS — I4891 Unspecified atrial fibrillation: Secondary | ICD-10-CM | POA: Insufficient documentation

## 2013-06-03 DIAGNOSIS — Z7982 Long term (current) use of aspirin: Secondary | ICD-10-CM | POA: Diagnosis not present

## 2013-06-03 DIAGNOSIS — K219 Gastro-esophageal reflux disease without esophagitis: Secondary | ICD-10-CM | POA: Diagnosis not present

## 2013-06-03 DIAGNOSIS — I252 Old myocardial infarction: Secondary | ICD-10-CM | POA: Insufficient documentation

## 2013-06-03 DIAGNOSIS — I10 Essential (primary) hypertension: Secondary | ICD-10-CM | POA: Diagnosis not present

## 2013-06-03 DIAGNOSIS — Z87442 Personal history of urinary calculi: Secondary | ICD-10-CM | POA: Insufficient documentation

## 2013-06-03 DIAGNOSIS — N200 Calculus of kidney: Secondary | ICD-10-CM | POA: Diagnosis not present

## 2013-06-03 DIAGNOSIS — Z79899 Other long term (current) drug therapy: Secondary | ICD-10-CM | POA: Diagnosis not present

## 2013-06-03 DIAGNOSIS — F411 Generalized anxiety disorder: Secondary | ICD-10-CM | POA: Insufficient documentation

## 2013-06-03 DIAGNOSIS — I509 Heart failure, unspecified: Secondary | ICD-10-CM | POA: Insufficient documentation

## 2013-06-03 DIAGNOSIS — E7800 Pure hypercholesterolemia, unspecified: Secondary | ICD-10-CM | POA: Insufficient documentation

## 2013-06-03 HISTORY — PX: CYSTOSCOPY WITH URETEROSCOPY AND STENT PLACEMENT: SHX6377

## 2013-06-03 LAB — BASIC METABOLIC PANEL
BUN: 21 mg/dL (ref 6–23)
CALCIUM: 9.3 mg/dL (ref 8.4–10.5)
CO2: 20 meq/L (ref 19–32)
Chloride: 103 mEq/L (ref 96–112)
Creatinine, Ser: 1.43 mg/dL — ABNORMAL HIGH (ref 0.50–1.10)
GFR calc Af Amer: 39 mL/min — ABNORMAL LOW (ref >90)
GFR, EST NON AFRICAN AMERICAN: 34 mL/min — AB (ref >90)
Glucose, Bld: 113 mg/dL — ABNORMAL HIGH (ref 70–99)
Potassium: 4.4 mEq/L (ref 3.7–5.3)
Sodium: 138 mEq/L (ref 137–147)

## 2013-06-03 LAB — CBC
HCT: 34.1 % — ABNORMAL LOW (ref 36.0–46.0)
Hemoglobin: 11.3 g/dL — ABNORMAL LOW (ref 12.0–15.0)
MCH: 31.4 pg (ref 26.0–34.0)
MCHC: 33.1 g/dL (ref 30.0–36.0)
MCV: 94.7 fL (ref 78.0–100.0)
PLATELETS: 232 10*3/uL (ref 150–400)
RBC: 3.6 MIL/uL — AB (ref 3.87–5.11)
RDW: 13.8 % (ref 11.5–15.5)
WBC: 6.2 10*3/uL (ref 4.0–10.5)

## 2013-06-03 SURGERY — CYSTOURETEROSCOPY, WITH STENT INSERTION
Anesthesia: General

## 2013-06-03 MED ORDER — FENTANYL CITRATE 0.05 MG/ML IJ SOLN: 25.0000 ug | INTRAMUSCULAR | Status: DC | PRN

## 2013-06-03 MED ORDER — SODIUM CHLORIDE 0.9 % IJ SOLN
INTRAMUSCULAR | Status: AC
  Filled 2013-06-03: qty 10

## 2013-06-03 MED ORDER — METOCLOPRAMIDE HCL 5 MG/ML IJ SOLN
INTRAMUSCULAR | Status: DC | PRN
  Administered 2013-06-03: 10 mg via INTRAVENOUS

## 2013-06-03 MED ORDER — FENTANYL CITRATE 0.05 MG/ML IJ SOLN
INTRAMUSCULAR | Status: AC
  Filled 2013-06-03: qty 2

## 2013-06-03 MED ORDER — EPHEDRINE SULFATE 50 MG/ML IJ SOLN
INTRAMUSCULAR | Status: DC | PRN
  Administered 2013-06-03 (×2): 5 mg via INTRAVENOUS

## 2013-06-03 MED ORDER — OXYCODONE HCL 5 MG PO TABS
5.0000 mg | ORAL_TABLET | ORAL | Status: DC | PRN
Start: 2013-06-03 — End: 2013-06-03

## 2013-06-03 MED ORDER — CIPROFLOXACIN IN D5W 400 MG/200ML IV SOLN
400.0000 mg | INTRAVENOUS | Status: AC
  Administered 2013-06-03: 400 mg via INTRAVENOUS

## 2013-06-03 MED ORDER — PROPOFOL 10 MG/ML IV BOLUS
INTRAVENOUS | Status: AC
  Filled 2013-06-03: qty 20

## 2013-06-03 MED ORDER — EPHEDRINE SULFATE 50 MG/ML IJ SOLN
INTRAMUSCULAR | Status: AC
  Filled 2013-06-03: qty 1

## 2013-06-03 MED ORDER — SODIUM CHLORIDE 0.9 % IV SOLN: 250.0000 mL | INTRAVENOUS | Status: DC | PRN

## 2013-06-03 MED ORDER — ONDANSETRON HCL 4 MG/2ML IJ SOLN
INTRAMUSCULAR | Status: DC | PRN
  Administered 2013-06-03: 4 mg via INTRAVENOUS

## 2013-06-03 MED ORDER — DEXAMETHASONE SODIUM PHOSPHATE 10 MG/ML IJ SOLN
INTRAMUSCULAR | Status: AC
  Filled 2013-06-03: qty 1

## 2013-06-03 MED ORDER — SODIUM CHLORIDE 0.9 % IR SOLN
Status: DC | PRN
  Administered 2013-06-03: 5000 mL via INTRAVESICAL

## 2013-06-03 MED ORDER — CIPROFLOXACIN HCL 250 MG PO TABS: 250.0000 mg | ORAL_TABLET | Freq: Two times a day (BID) | ORAL | Status: AC

## 2013-06-03 MED ORDER — 0.9 % SODIUM CHLORIDE (POUR BTL) OPTIME
TOPICAL | Status: DC | PRN
  Administered 2013-06-03: 1000 mL

## 2013-06-03 MED ORDER — PHENYLEPHRINE HCL 10 MG/ML IJ SOLN
INTRAMUSCULAR | Status: DC | PRN
  Administered 2013-06-03: 80 ug via INTRAVENOUS
  Administered 2013-06-03: 40 ug via INTRAVENOUS

## 2013-06-03 MED ORDER — ACETAMINOPHEN 650 MG RE SUPP
650.0000 mg | RECTAL | Status: DC | PRN
  Filled 2013-06-03: qty 1

## 2013-06-03 MED ORDER — DEXAMETHASONE SODIUM PHOSPHATE 4 MG/ML IJ SOLN
INTRAMUSCULAR | Status: DC | PRN
  Administered 2013-06-03: 10 mg via INTRAVENOUS

## 2013-06-03 MED ORDER — CIPROFLOXACIN IN D5W 400 MG/200ML IV SOLN
INTRAVENOUS | Status: AC
  Filled 2013-06-03: qty 200

## 2013-06-03 MED ORDER — LACTATED RINGERS IV SOLN: INTRAVENOUS | Status: DC

## 2013-06-03 MED ORDER — METOCLOPRAMIDE HCL 5 MG/ML IJ SOLN
INTRAMUSCULAR | Status: AC
  Filled 2013-06-03: qty 2

## 2013-06-03 MED ORDER — SODIUM CHLORIDE 0.9 % IJ SOLN: 3.0000 mL | Freq: Two times a day (BID) | INTRAMUSCULAR | Status: DC

## 2013-06-03 MED ORDER — FENTANYL CITRATE 0.05 MG/ML IJ SOLN
INTRAMUSCULAR | Status: DC | PRN
  Administered 2013-06-03 (×2): 50 ug via INTRAVENOUS

## 2013-06-03 MED ORDER — LACTATED RINGERS IV SOLN
INTRAVENOUS | Status: DC | PRN
  Administered 2013-06-03: 10:00:00 via INTRAVENOUS

## 2013-06-03 MED ORDER — PROPOFOL 10 MG/ML IV BOLUS
INTRAVENOUS | Status: DC | PRN
  Administered 2013-06-03: 50 mg via INTRAVENOUS
  Administered 2013-06-03: 150 mg via INTRAVENOUS

## 2013-06-03 MED ORDER — ACETAMINOPHEN 325 MG PO TABS: 650.0000 mg | ORAL_TABLET | ORAL | Status: DC | PRN

## 2013-06-03 MED ORDER — SODIUM CHLORIDE 0.9 % IJ SOLN: 3.0000 mL | INTRAMUSCULAR | Status: DC | PRN

## 2013-06-03 MED ORDER — ONDANSETRON HCL 4 MG/2ML IJ SOLN
INTRAMUSCULAR | Status: AC
  Filled 2013-06-03: qty 2

## 2013-06-03 MED ORDER — LIDOCAINE HCL 2 % EX GEL
CUTANEOUS | Status: AC
  Filled 2013-06-03: qty 10

## 2013-06-03 SURGICAL SUPPLY — 10 items
BASKET STONE NCOMPASS (UROLOGICAL SUPPLIES) ×2 IMPLANT
CATH URET 5FR 28IN OPEN ENDED (CATHETERS) IMPLANT
DRAPE CAMERA CLOSED 9X96 (DRAPES) ×2 IMPLANT
EXTRACTOR STONE NITINOL NGAGE (UROLOGICAL SUPPLIES) ×2 IMPLANT
FIBER LASER FLEXIVA 200 (UROLOGICAL SUPPLIES) ×2 IMPLANT
GLOVE SURG SS PI 8.0 STRL IVOR (GLOVE) IMPLANT
GOWN STRL REUS W/TWL XL LVL3 (GOWN DISPOSABLE) ×3 IMPLANT
PACK CYSTO (CUSTOM PROCEDURE TRAY) ×2 IMPLANT
SHEATH ACCESS URETERAL 38CM (SHEATH) ×2 IMPLANT
STENT CONTOUR 6FRX26X.038 (STENTS) ×2 IMPLANT

## 2013-06-03 NOTE — Interval H&P Note (Signed)
History and Physical Interval Note:  She returns for repeat ureteroscopy for residual fragments.    She is doing well.   06/03/2013 10:21 AM  [name]  has presented today for surgery, with the diagnosis of RIGHT RENAL STONES  The various methods of treatment have been discussed with the patient and family. After consideration of risks, benefits and other options for treatment, the patient has consented to  Procedure(s): SECOND LOOK CYSTOSCOPY WITH URETEROSCOPY POSSIBLE HOLMIUM LASER LITHO AND STONE EXTRACTION JJ STENT  (N/A) as a surgical intervention .  The patient's history has been reviewed, patient examined, no change in status, stable for surgery.  I have reviewed the patient's chart and labs.  Questions were answered to the patient's satisfaction.     Malka So

## 2013-06-03 NOTE — Brief Op Note (Signed)
06/03/2013  11:39 AM  PATIENT:  [name]  80 y.o. female  PRE-OPERATIVE DIAGNOSIS:  RIGHT RENAL STONES  POST-OPERATIVE DIAGNOSIS:  RIGHT RENAL STONES  PROCEDURE:  Procedure(s): SECOND LOOK CYSTOSCOPY WITH URETEROSCOPY POSSIBLE HOLMIUM LASER LITHO AND STONE EXTRACTION JJ STENT  (N/A)  SURGEON:  Surgeon(s) and Role:    * Malka So, MD - Primary  PHYSICIAN ASSISTANT:   ASSISTANTS: none   ANESTHESIA:   general  EBL:     BLOOD ADMINISTERED:none  DRAINS: 6 x 26 JJ stent on right   LOCAL MEDICATIONS USED:  NONE  SPECIMEN:  Source of Specimen:  stone fragments  DISPOSITION OF SPECIMEN:  to family  COUNTS:  YES  TOURNIQUET:  * No tourniquets in log *  DICTATION: .Other Dictation: Dictation Number [id number]  PLAN OF CARE: Discharge to home after PACU  PATIENT DISPOSITION:  PACU - hemodynamically stable.   Delay start of Pharmacological VTE agent (>24hrs) due to surgical blood loss or risk of bleeding: not applicable

## 2013-06-03 NOTE — Discharge Instructions (Signed)

## 2013-06-03 NOTE — Anesthesia Preprocedure Evaluation (Addendum)
Anesthesia Evaluation  Patient identified by MRN, date of birth, ID band Patient awake    Reviewed: Allergy & Precautions, H&P , NPO status , Patient's Chart, lab work & pertinent test results, reviewed documented beta blocker date and time   Airway Mallampati: II TM Distance: >3 FB Neck ROM: full    Dental  (+) Edentulous Upper, Edentulous Lower, Dental Advisory Given   Pulmonary neg pulmonary ROS,  breath sounds clear to auscultation  Pulmonary exam normal       Cardiovascular Exercise Tolerance: Good hypertension, Pt. on home beta blockers and Pt. on medications + Past MI and + Cardiac Stents + Cardiac Defibrillator Rhythm:regular Rate:Normal  Orthostatic hypotension. LBBB   Neuro/Psych negative neurological ROS  negative psych ROS   GI/Hepatic negative GI ROS, Neg liver ROS, hiatal hernia, GERD-  Medicated and Controlled,dysphagia   Endo/Other  negative endocrine ROS  Renal/GU Renal diseaseStage 3 chronic renal disease  negative genitourinary   Musculoskeletal   Abdominal   Peds  Hematology negative hematology ROS (+)   Anesthesia Other Findings   Reproductive/Obstetrics negative OB ROS                          Anesthesia Physical Anesthesia Plan  ASA: III  Anesthesia Plan: General   Post-op Pain Management:    Induction: Intravenous  Airway Management Planned: LMA  Additional Equipment:   Intra-op Plan:   Post-operative Plan:   Informed Consent: I have reviewed the patients History and Physical, chart, labs and discussed the procedure including the risks, benefits and alternatives for the proposed anesthesia with the patient or authorized representative who has indicated his/her understanding and acceptance.   Dental Advisory Given  Plan Discussed with: CRNA and Surgeon  Anesthesia Plan Comments:         Anesthesia Quick Evaluation

## 2013-06-03 NOTE — Progress Notes (Signed)
Pt ambulated down short stay hall to bathroom.  Pt voided moderate amount of tea colored clear urine.  Pt tolerated well.  Pt has no complaints.

## 2013-06-03 NOTE — H&P (View-Only) (Signed)
eason For Visit  [name] presents today with right hydronephrosis   History of Present Illness      [name] has a  history of nephrolithiasis.  CT in 2013 confirmed bilateral non obstructing stones.   She has known renal insufficiency.   Interval history: [name] presents today at the request of her PCP.  She has had right sided back and abdominal pain for at least 2 weeks.  An ultrasound was performed 2 days ago as he was concerned about her gallbladder.  This revealed right hydronephrosis thus he told her to follow up here.  She is doing okay today.  She does have intermittent pain relieved with tylenol and a heating pad.  Her pain is associated with bladder pressure and urgency.  She denies gross hematuria, n/v or f/c.   Past Medical History Problems   1. History of Abdominal pain, RLQ (right lower quadrant) (789.03)  2. History of Acute Myocardial Infarction (V12.59)  3. History of Anxiety (300.00)  4. History of Atrial fibrillation (427.31)  5. History of Bladder pain (788.99)  6. History of Disorder of heart rhythm (427.9)  7. History of Dysuria (788.1)  8. History of cardiac disorder (V12.50)  9. History of chronic cystitis (V13.00)  10. History of congestive heart disease (V12.59)  11. History of heartburn (V12.79)  12. History of hypercholesterolemia (V12.29)  13. History of hypertension (V12.59)  14. History of kidney stones (V13.01)  Surgical History Problems   1. History of Excision Of External Thrombosed Hemorrhoids  2. History of Implantable Cardioverter-Defibrillator  Current Meds  1. Aspirin 81 MG Oral Tablet;  Therapy: (Recorded:04Nov2009) to Recorded  2. Centrum Silver TABS;  Therapy: (Recorded:04Nov2009) to Recorded  3. Citracal + D TABS;  Therapy: (Recorded:05Jul2013) to Recorded  4. Crestor 10 MG Oral Tablet;  Therapy: (Recorded:04Nov2009) to Recorded  5. Demadex 20 MG Oral Tablet;  Therapy: (Recorded:04Nov2009) to Recorded  6. Metoprolol  Tartrate 50 MG Oral Tablet;  Therapy: (Recorded:04Nov2009) to Recorded  7. Norpace CR 100 MG Oral Capsule Extended Release 12 Hour;  Therapy: (Recorded:04Nov2009) to Recorded  8. Paxil 20 MG Oral Tablet;  Therapy: (Recorded:04Nov2009) to Recorded  9. Vitamin D 50000 UNIT CAPS;  Therapy: (Recorded:14Jul2010) to Recorded  10. Zestril 10 MG Oral Tablet;   Therapy: (Recorded:04Nov2009) to Recorded  Allergies Non-Medication   1. Contrast Dye  Family History Problems   1. Family history of Death In The Family Father : Father   age 67; heart failure  2. Family history of Death In The Family Mother : Mother   age 79; stroke  3. Family history of Family Health Status Number Of Children   3 sons; 1 daughter  Social History Problems   1. Denied: History of Alcohol Use  2. Denied: History of Caffeine Use  3. Marital History - Widowed  4. Occupation:   housewife  16. Denied: History of Tobacco Use  Review of Systems Genitourinary, constitutional and gastrointestinal system(s) were reviewed and pertinent findings if present are noted.  Genitourinary: urinary urgency.  Gastrointestinal: nausea, flank pain and abdominal pain.    Vitals Vital Signs [Data Includes: Last 1 Day]  Recorded: 30Apr2015 03:41PM  Blood Pressure: 137 / 73 Temperature: 97.9 F Heart Rate: 64  Physical Exam Constitutional: Well nourished and well developed . No acute distress.  Pulmonary: No respiratory distress and normal respiratory rhythm and effort.  Abdomen: The abdomen is soft and nontender. No CVA tenderness.    Results/Data Urine [Data Includes: Last 1  Day]   30Apr2015  COLOR YELLOW   APPEARANCE CLEAR   SPECIFIC GRAVITY 1.025   pH 6.0   GLUCOSE NEG mg/dL  BILIRUBIN NEG   KETONE NEG mg/dL  BLOOD MOD   PROTEIN NEG mg/dL  UROBILINOGEN 0.2 mg/dL  NITRITE NEG   LEUKOCYTE ESTERASE SMALL   SQUAMOUS EPITHELIAL/HPF RARE   WBC 11-20 WBC/hpf  RBC 3-6 RBC/hpf  BACTERIA RARE   CRYSTALS NONE SEEN    CASTS NONE SEEN    The following images/tracing/specimen were independently visualized: Marland Kitchen    KUB: small bilateral non obstructing stones and what appear to be at least 2 large mid ureteral stones on the right, no obvious left ureteral stones seen, defibrillator seen    Assessment Assessed   1. Calculus of right ureter (592.1)   Per KUB it appears [name] has two large right mid ureteral stones.  These are at least 7 mm.  I told her I do not think she will be able to pass these.  We discussed ESWL vs ureteroscopy today. I am going to defer this decision to Dr. Jeffie Pollock due to the fact she has a defibrillator as I am not sure she is a candidate for ESWL. She is pretty asymptomatic at this time.  I will give her tamsulosin to take daily in the meantime and she will strain her urine.  I told her again I am not sure she will pass these stones but certainly if she does she will let us know. She will also call if she has pain or n/v uncontrolled with medication or a temp of 100.5 or greater. She reportedly has pain and nausea medication at home from Dr. Laurance Flatten.   Plan Bilateral kidney stones   1. Start: Tamsulosin HCl - 0.4 MG Oral Capsule; TAKE 1 CAPSULE Daily  2. KUB; Status:Complete;   Done: 30Apr2015 12:00AM Health Maintenance   3. UA With REFLEX; [Do Not Release]; Status:Complete;   Done: [id number] 03:19PM   1. Tamsulosin for possible MET of right ureteral stones 2. Send KUB to Dr. Jeffie Pollock for MD Advice on treatment

## 2013-06-03 NOTE — Anesthesia Postprocedure Evaluation (Signed)
  Anesthesia Post-op Note  Patient: [name]  Procedure(s) Performed: Procedure(s) (LRB): SECOND LOOK CYSTOSCOPY WITH URETEROSCOPY  HOLMIUM LASER LITHO AND STONE EXTRACTION JJ STENT  (N/A)  Patient Location: PACU  Anesthesia Type: General  Level of Consciousness: awake and alert   Airway and Oxygen Therapy: Patient Spontanous Breathing  Post-op Pain: mild  Post-op Assessment: Post-op Vital signs reviewed, Patient's Cardiovascular Status Stable, Respiratory Function Stable, Patent Airway and No signs of Nausea or vomiting  Last Vitals:  Filed Vitals:   06/03/13 1200  BP: 133/63  Pulse:   Temp:   Resp:     Post-op Vital Signs: stable   Complications: No apparent anesthesia complications

## 2013-06-03 NOTE — Transfer of Care (Signed)
Immediate Anesthesia Transfer of Care Note  Patient: [name]  Procedure(s) Performed: Procedure(s): SECOND LOOK CYSTOSCOPY WITH URETEROSCOPY  HOLMIUM LASER LITHO AND STONE EXTRACTION JJ STENT  (N/A)  Patient Location: PACU  Anesthesia Type:General  Level of Consciousness: Patient easily awoken, sedated, comfortable, cooperative, following commands, responds to stimulation.   Airway & Oxygen Therapy: Patient spontaneously breathing, ventilating well, oxygen via simple oxygen mask.  Post-op Assessment: Report given to PACU RN, vital signs reviewed and stable, moving all extremities.   Post vital signs: Reviewed and stable.  Complications: No apparent anesthesia complications

## 2013-06-04 NOTE — Op Note (Signed)
NAMEKINDRA, WOLSKE NO.:  [id number]  MEDICAL RECORD NO.:  [id number]  LOCATION:  [facility]                         FACILITY:  [facility]  PHYSICIAN:  Marshall Cork. Jeffie Pollock, M.D.    DATE OF BIRTH:  08/29/1931  DATE OF PROCEDURE:  06/03/2013 DATE OF DISCHARGE:  06/03/2013                              OPERATIVE REPORT   PROCEDURE:  Second stage right ureteroscopic stone extraction with holmium and stent placement.  PREOPERATIVE DIAGNOSIS:  Right renal stones.  POSTOPERATIVE DIAGNOSIS:  Right renal stones.  SURGEON:  Marshall Cork. Jeffie Pollock, M.D.  ANESTHESIA:  General.  SPECIMEN:  Stone fragments.  DRAINS:  A 6-French 26-cm double-J stent with string.  BLOOD LOSS:  None.  COMPLICATIONS:  None.  INDICATIONS:  Ms. Scalf is a 80 year old, white female, who underwent initial ureteroscopy for two right proximal stones approximately 2 weeks ago, several of the larger stone fragments reflux to the kidney and I was unable at that time to easily dilate the proximal ureter to chase the stones into the kidney.  So, a stent was placed and she returns now for management of the residual stones.  FINDINGS OF PROCEDURE:  She was given Cipro.  She was taken to the operating room where general anesthetic was induced.  She was placed in a lithotomy position and fitted with PAS hose.  Her perineum and genitalia were prepped with Betadine solution.  She was draped in usual sterile fashion.  Cystoscopy was performed using the 22-French cystoscope and the 12- degree lens.  The right ureteral stent distal loop was identified, grasped with grasping forceps and pulled to the urethral meatus.  A guidewire was then passed to the kidney and the stent was removed.  A 38-cm digital access sheath was then disassembled and the inner core was used to dilate the ureter to the kidney.  The sheath was then reassembled and the assembled sheath was passed to the kidney without difficulty.  The guidewire and  inner core were then removed and the digital flexible ureteroscope was advanced through the sheath to the kidney.  Initially, fragments were found in the lower pole calyx and these were removed with the NGage basket, all of them were small enough to be removed intact. Subsequent inspection and a more inferior lower pole calyx revealed a larger fragment, which would not fit through the sheath on an attempt at basketing.  It was returned to the renal pelvis where it was then engaged with a 200-micron laser fiber with the holmium laser set on 1 watt and 20 hertz.  The stone was then broken into manageable fragments.  The fragments were then removed with combination of the NGage and NCompass baskets and once all significant fragments were removed, final inspection revealed only very small fragments or sand that were not large enough to engage in the basket.  At this point, a guidewire was reinserted through the ureteroscope.  The ureteroscope was then retracted along with the sheath in the lower portion the proximal ureter.  Another substantial fragment was identified.  The guidewire was removed and the NCompass basket was used to remove this fragment.  The wire was reinserted and the scope was then removed  with visual inspection of the ureter along with the sheath.  There was some narrowing at the area of the previous stone impaction and little bit of mucosal trauma distally and I felt replacing the stent for few dose was indicated.  Once the sheath and ureteroscope were out, the cystoscope was reinserted over the wire and a fresh 6-French 26-cm double-J stent with string was inserted over the wire to the kidney.  The wire was removed leaving a good coil in the kidney, a good coil in the bladder.  The stent string was tied close to the meatus, trimmed and then tucked vaginally for later stent removal.  The patient was then taken down from lithotomy position.  Her anesthetic was  reversed.  She was moved to the recovery room in stable condition. There were no complications.     Marshall Cork. Jeffie Pollock, M.D.     JJW/MEDQ  D:  06/03/2013  T:  06/04/2013  Job:  [id number]

## 2013-06-05 ENCOUNTER — Encounter (HOSPITAL_COMMUNITY): Payer: Self-pay | Admitting: Urology

## 2013-06-06 ENCOUNTER — Ambulatory Visit (INDEPENDENT_AMBULATORY_CARE_PROVIDER_SITE_OTHER): Payer: Medicare Other | Admitting: Urology

## 2013-06-06 ENCOUNTER — Other Ambulatory Visit: Payer: Medicare Other

## 2013-06-06 DIAGNOSIS — N189 Chronic kidney disease, unspecified: Secondary | ICD-10-CM | POA: Diagnosis not present

## 2013-06-06 DIAGNOSIS — N201 Calculus of ureter: Secondary | ICD-10-CM

## 2013-06-10 ENCOUNTER — Other Ambulatory Visit: Payer: Medicare Other

## 2013-06-10 ENCOUNTER — Telehealth: Payer: Self-pay | Admitting: Family Medicine

## 2013-06-10 DIAGNOSIS — Z1212 Encounter for screening for malignant neoplasm of rectum: Secondary | ICD-10-CM | POA: Diagnosis not present

## 2013-06-10 NOTE — Progress Notes (Signed)
Pt came in for lab  only 

## 2013-06-11 LAB — FECAL OCCULT BLOOD, IMMUNOCHEMICAL: Fecal Occult Bld: NEGATIVE

## 2013-06-13 NOTE — Telephone Encounter (Signed)
Patient aware.

## 2013-06-17 ENCOUNTER — Other Ambulatory Visit: Payer: Self-pay | Admitting: Family Medicine

## 2013-06-18 ENCOUNTER — Other Ambulatory Visit: Payer: Self-pay | Admitting: *Deleted

## 2013-06-18 ENCOUNTER — Other Ambulatory Visit: Payer: Self-pay

## 2013-06-18 MED ORDER — LISINOPRIL 10 MG PO TABS: 10.0000 mg | ORAL_TABLET | Freq: Two times a day (BID) | ORAL | Status: DC

## 2013-06-18 MED ORDER — PANTOPRAZOLE SODIUM 40 MG PO TBEC: 40.0000 mg | DELAYED_RELEASE_TABLET | Freq: Every day | ORAL | Status: DC

## 2013-06-18 MED ORDER — PAROXETINE HCL 20 MG PO TABS: 20.0000 mg | ORAL_TABLET | Freq: Every morning | ORAL | Status: DC

## 2013-06-18 MED ORDER — METOPROLOL TARTRATE 25 MG PO TABS: 12.5000 mg | ORAL_TABLET | Freq: Two times a day (BID) | ORAL | Status: DC

## 2013-06-18 NOTE — Telephone Encounter (Signed)
Rx was sent to pharmacy electronically. 

## 2013-06-20 ENCOUNTER — Other Ambulatory Visit: Payer: Self-pay | Admitting: *Deleted

## 2013-06-20 MED ORDER — PAROXETINE HCL 20 MG PO TABS: 20.0000 mg | ORAL_TABLET | Freq: Every morning | ORAL | Status: DC

## 2013-07-04 ENCOUNTER — Ambulatory Visit (HOSPITAL_COMMUNITY)
Admission: RE | Admit: 2013-07-04 | Discharge: 2013-07-04 | Disposition: A | Discharge status: Home / Self Care | Payer: Medicare Other | Source: Ambulatory Visit | Attending: Urology | Admitting: Urology

## 2013-07-04 ENCOUNTER — Other Ambulatory Visit: Payer: Self-pay | Admitting: Urology

## 2013-07-04 DIAGNOSIS — N201 Calculus of ureter: Secondary | ICD-10-CM

## 2013-07-04 DIAGNOSIS — M4120 Other idiopathic scoliosis, site unspecified: Secondary | ICD-10-CM | POA: Diagnosis not present

## 2013-07-04 DIAGNOSIS — N200 Calculus of kidney: Secondary | ICD-10-CM | POA: Diagnosis not present

## 2013-07-04 DIAGNOSIS — N2889 Other specified disorders of kidney and ureter: Secondary | ICD-10-CM | POA: Insufficient documentation

## 2013-07-09 ENCOUNTER — Other Ambulatory Visit: Payer: Medicare Other | Admitting: Family

## 2013-07-10 DIAGNOSIS — H353290 Exudative age-related macular degeneration, unspecified eye, stage unspecified: Secondary | ICD-10-CM | POA: Diagnosis not present

## 2013-07-10 DIAGNOSIS — H35059 Retinal neovascularization, unspecified, unspecified eye: Secondary | ICD-10-CM | POA: Diagnosis not present

## 2013-07-11 ENCOUNTER — Other Ambulatory Visit: Payer: Self-pay | Admitting: Urology

## 2013-07-11 ENCOUNTER — Ambulatory Visit (INDEPENDENT_AMBULATORY_CARE_PROVIDER_SITE_OTHER): Payer: Medicare Other | Admitting: Urology

## 2013-07-11 DIAGNOSIS — N200 Calculus of kidney: Secondary | ICD-10-CM

## 2013-07-11 DIAGNOSIS — N201 Calculus of ureter: Secondary | ICD-10-CM | POA: Diagnosis not present

## 2013-07-30 ENCOUNTER — Ambulatory Visit (INDEPENDENT_AMBULATORY_CARE_PROVIDER_SITE_OTHER): Payer: Medicare Other | Admitting: *Deleted

## 2013-07-30 DIAGNOSIS — I259 Chronic ischemic heart disease, unspecified: Secondary | ICD-10-CM

## 2013-07-30 LAB — MDC_IDC_ENUM_SESS_TYPE_INCLINIC
HIGH POWER IMPEDANCE MEASURED VALUE: 49 Ohm
Lead Channel Pacing Threshold Pulse Width: 0.5 ms
Lead Channel Setting Pacing Amplitude: 2.2 V
Lead Channel Setting Pacing Pulse Width: 0.5 ms
MDC IDC MSMT LEADCHNL RV IMPEDANCE VALUE: 798 Ohm
MDC IDC MSMT LEADCHNL RV PACING THRESHOLD AMPLITUDE: 0.8 V
MDC IDC MSMT LEADCHNL RV SENSING INTR AMPL: 12.4 mV
MDC IDC PG SERIAL: 133463
Zone Setting Detection Interval: 273 ms
Zone Setting Detection Interval: 363 ms
Zone Setting Detection Interval: 428 ms

## 2013-07-30 NOTE — Progress Notes (Signed)
ICD check in clinic. Normal device function. Thresholds and sensing consistent with previous device measurements. Impedance trends stable over time. No evidence of any ventricular arrhythmias.  Histogram distribution appropriate for patient and level of activity. No changes made this session. Device programmed at appropriate safety margins. Device programmed to optimize intrinsic conduction. Estimated longevity MOL2.  Patient education completed including shock plan. Alert tones/vibration demonstrated for patient.  ROV in October with Dr. Sallyanne Kuster.

## 2013-08-04 ENCOUNTER — Ambulatory Visit (HOSPITAL_COMMUNITY)
Admission: RE | Admit: 2013-08-04 | Discharge: 2013-08-04 | Disposition: A | Discharge status: Home / Self Care | Payer: Medicare Other | Source: Ambulatory Visit | Attending: Urology | Admitting: Urology

## 2013-08-04 ENCOUNTER — Other Ambulatory Visit: Payer: Self-pay | Admitting: Urology

## 2013-08-04 DIAGNOSIS — N200 Calculus of kidney: Secondary | ICD-10-CM

## 2013-08-04 DIAGNOSIS — N189 Chronic kidney disease, unspecified: Secondary | ICD-10-CM | POA: Insufficient documentation

## 2013-08-12 ENCOUNTER — Encounter: Payer: Self-pay | Admitting: Cardiovascular Disease

## 2013-08-15 ENCOUNTER — Ambulatory Visit (INDEPENDENT_AMBULATORY_CARE_PROVIDER_SITE_OTHER): Payer: Medicare Other | Admitting: Urology

## 2013-08-15 DIAGNOSIS — N200 Calculus of kidney: Secondary | ICD-10-CM | POA: Diagnosis not present

## 2013-08-15 DIAGNOSIS — M545 Low back pain, unspecified: Secondary | ICD-10-CM | POA: Diagnosis not present

## 2013-08-28 DIAGNOSIS — H35059 Retinal neovascularization, unspecified, unspecified eye: Secondary | ICD-10-CM | POA: Diagnosis not present

## 2013-08-28 DIAGNOSIS — H353290 Exudative age-related macular degeneration, unspecified eye, stage unspecified: Secondary | ICD-10-CM | POA: Diagnosis not present

## 2013-08-28 DIAGNOSIS — H35359 Cystoid macular degeneration, unspecified eye: Secondary | ICD-10-CM | POA: Diagnosis not present

## 2013-09-02 DIAGNOSIS — M1710 Unilateral primary osteoarthritis, unspecified knee: Secondary | ICD-10-CM | POA: Diagnosis not present

## 2013-09-09 ENCOUNTER — Other Ambulatory Visit (INDEPENDENT_AMBULATORY_CARE_PROVIDER_SITE_OTHER): Payer: Medicare Other

## 2013-09-09 DIAGNOSIS — E785 Hyperlipidemia, unspecified: Secondary | ICD-10-CM

## 2013-09-09 DIAGNOSIS — I10 Essential (primary) hypertension: Secondary | ICD-10-CM

## 2013-09-09 DIAGNOSIS — E559 Vitamin D deficiency, unspecified: Secondary | ICD-10-CM | POA: Diagnosis not present

## 2013-09-09 LAB — POCT CBC
Granulocyte percent: 64.6 %G (ref 37–80)
HEMATOCRIT: 39.7 % (ref 37.7–47.9)
Hemoglobin: 12.8 g/dL (ref 12.2–16.2)
Lymph, poc: 2 (ref 0.6–3.4)
MCH: 30.7 pg (ref 27–31.2)
MCHC: 32.3 g/dL (ref 31.8–35.4)
MCV: 95 fL (ref 80–97)
MPV: 7.4 fL (ref 0–99.8)
POC GRANULOCYTE: 3.8 (ref 2–6.9)
POC LYMPH %: 33.6 % (ref 10–50)
Platelet Count, POC: 216 10*3/uL (ref 142–424)
RBC: 4.2 M/uL (ref 4.04–5.48)
RDW, POC: 13.3 %
WBC: 5.9 10*3/uL (ref 4.6–10.2)

## 2013-09-09 NOTE — Progress Notes (Signed)
Pt came in for lab  only 

## 2013-09-10 LAB — HEPATIC FUNCTION PANEL
ALBUMIN: 4 g/dL (ref 3.5–4.8)
ALK PHOS: 79 IU/L (ref 39–117)
ALT: 14 IU/L (ref 0–32)
AST: 15 IU/L (ref 0–40)
BILIRUBIN DIRECT: 0.15 mg/dL (ref 0.00–0.40)
Total Bilirubin: 0.6 mg/dL (ref 0.0–1.2)
Total Protein: 6.7 g/dL (ref 6.0–8.5)

## 2013-09-10 LAB — BMP8+EGFR
BUN/Creatinine Ratio: 17 (ref 11–26)
BUN: 25 mg/dL (ref 8–27)
CO2: 23 mmol/L (ref 18–29)
Calcium: 9.5 mg/dL (ref 8.7–10.3)
Chloride: 103 mmol/L (ref 97–108)
Creatinine, Ser: 1.46 mg/dL — ABNORMAL HIGH (ref 0.57–1.00)
GFR, EST AFRICAN AMERICAN: 39 mL/min/{1.73_m2} — AB (ref >59)
GFR, EST NON AFRICAN AMERICAN: 34 mL/min/{1.73_m2} — AB (ref >59)
GLUCOSE: 94 mg/dL (ref 65–99)
POTASSIUM: 4.5 mmol/L (ref 3.5–5.2)
Sodium: 141 mmol/L (ref 134–144)

## 2013-09-10 LAB — NMR, LIPOPROFILE
CHOLESTEROL: 147 mg/dL (ref 100–199)
HDL Cholesterol by NMR: 55 mg/dL (ref >39)
HDL Particle Number: 34.8 umol/L (ref >=30.5)
LDL Particle Number: 770 nmol/L (ref <1000)
LDL Size: 20.4 nm (ref >20.5)
LDLC SERPL CALC-MCNC: 65 mg/dL (ref 0–99)
LP-IR Score: 51 — ABNORMAL HIGH (ref <=45)
Small LDL Particle Number: 496 nmol/L (ref <=527)
TRIGLYCERIDES BY NMR: 134 mg/dL (ref 0–149)

## 2013-09-10 LAB — VITAMIN D 25 HYDROXY (VIT D DEFICIENCY, FRACTURES): Vit D, 25-Hydroxy: 27.8 ng/mL — ABNORMAL LOW (ref 30.0–100.0)

## 2013-09-15 ENCOUNTER — Encounter: Payer: Self-pay | Admitting: Family Medicine

## 2013-09-15 ENCOUNTER — Telehealth: Payer: Self-pay | Admitting: Family Medicine

## 2013-09-15 ENCOUNTER — Ambulatory Visit (INDEPENDENT_AMBULATORY_CARE_PROVIDER_SITE_OTHER): Payer: Medicare Other | Admitting: Family Medicine

## 2013-09-15 ENCOUNTER — Other Ambulatory Visit: Payer: Self-pay

## 2013-09-15 VITALS — BP 126/74 | HR 57 | Temp 98.0°F | Ht 64.0 in | Wt 180.0 lb

## 2013-09-15 DIAGNOSIS — E7800 Pure hypercholesterolemia, unspecified: Secondary | ICD-10-CM | POA: Diagnosis not present

## 2013-09-15 DIAGNOSIS — Z1231 Encounter for screening mammogram for malignant neoplasm of breast: Secondary | ICD-10-CM

## 2013-09-15 DIAGNOSIS — E8881 Metabolic syndrome: Secondary | ICD-10-CM

## 2013-09-15 DIAGNOSIS — R300 Dysuria: Secondary | ICD-10-CM

## 2013-09-15 DIAGNOSIS — I259 Chronic ischemic heart disease, unspecified: Secondary | ICD-10-CM | POA: Diagnosis not present

## 2013-09-15 DIAGNOSIS — R1084 Generalized abdominal pain: Secondary | ICD-10-CM

## 2013-09-15 LAB — POCT UA - MICROSCOPIC ONLY
Bacteria, U Microscopic: NEGATIVE
CASTS, UR, LPF, POC: NEGATIVE
CRYSTALS, UR, HPF, POC: NEGATIVE
Mucus, UA: NEGATIVE
RBC, URINE, MICROSCOPIC: NEGATIVE
YEAST UA: NEGATIVE

## 2013-09-15 LAB — POCT URINALYSIS DIPSTICK
Bilirubin, UA: NEGATIVE
Blood, UA: NEGATIVE
Glucose, UA: NEGATIVE
KETONES UA: NEGATIVE
Nitrite, UA: NEGATIVE
PH UA: 6
PROTEIN UA: NEGATIVE
Spec Grav, UA: 1.01
UROBILINOGEN UA: NEGATIVE

## 2013-09-15 NOTE — Patient Instructions (Addendum)
Medicare Annual Wellness Visit  [facility] and the medical providers at [facility] strive to bring you the best medical care.  In doing so we not only want to address your current medical conditions and concerns but also to detect new conditions early and prevent illness, disease and health-related problems.    Medicare offers a yearly Wellness Visit which allows our clinical staff to assess your need for preventative services including immunizations, lifestyle education, counseling to decrease risk of preventable diseases and screening for fall risk and other medical concerns.    This visit is provided free of charge (no copay) for all Medicare recipients. The clinical pharmacists at [facility] have begun to conduct these Wellness Visits which will also include a thorough review of all your medications.    As you primary medical provider recommend that you make an appointment for your Annual Wellness Visit if you have not done so already this year.  You may set up this appointment before you leave today or you may call back [phone] and schedule an appointment.  Please make sure when you call that you mention that you are scheduling your Annual Wellness Visit with the clinical pharmacist so that the appointment may be made for the proper length of time.     Continue current medications. Continue good therapeutic lifestyle changes which include good diet and exercise. Fall precautions discussed with patient. If an FOBT was given today- please return it to our front desk. If you are over 55 years old - you may need Prevnar 77 or the adult Pneumonia vaccine.  Flu Shots will be available at our office starting mid- September. Please call and schedule a FLU CLINIC APPOINTMENT.   Continue followups with urology and cardiology and orthopedic For the next few days drink more fluids, avoid caffeine milk cheese ice cream and dairy  product Try to eat foods that are more bland and not fried If the abdominal discomfort gets worse call us back or call the urologist

## 2013-09-15 NOTE — Telephone Encounter (Signed)
Message copied by Waverly Ferrari on Mon Sep 15, 2013 12:01 PM ------      Message from: Chipper Herb      Created: Wed Sep 10, 2013  7:41 AM       The blood sugar is good at 94. The creatinine, the most important kidney function tests remains elevated but it is stable and consistent with past readings. The electrolytes including potassium are within normal limits.      All cholesterol numbers was advanced lipid testing are excellent and at goal, continue current treatment      All liver function tests are within normal limit      The vitamin D level remains low. Because of the elevated creatinine we cannot increase the vitamin D by mouth anymore than she is currently taking. ------

## 2013-09-15 NOTE — Progress Notes (Signed)
Subjective:    Patient ID: [name], female    DOB: [birth year], 80 y.o.   MRN: [id number]  HPI Pt here for follow up and management of chronic medical problems. The patient has been seeing the orthopedist Re: right knee pain and she has been receiving injections for this. She also complains of some dysuria and is concerned that she may have another kidney side. She is followed by the urologist for this. She has had some changes in her medication recently by the cardiologist and attributes her medication changed to some lightheadedness. We will review her lab work with her during the visit today. The patient's recent abdominal ultrasound and CT scans were reviewed with her today. The most recent abdominal ultrasound done in July revealed bilateral nephrolithiasis without hydronephrosis. The CT scan of her abdomen in April revealed no gallstones.        Patient Active Problem List   Diagnosis Date Noted  . Ureteral stone with hydronephrosis 05/20/2013  . Metabolic syndrome [id number]  . Hypotension, meds stopped this admission 07/29/2011  . Orthostatic hypotension 07/27/2011  . Chest pain, r/o cardiac, negative MI 07/25/2011  . Fatigue X 3wks, presumably secondary to medications 07/25/2011    Class: Acute  . Chronic sinus bradycardia 07/25/2011  . Automatic implantable cardioverter-defibrillator in situ 07/25/2011  . Chronic renal insufficiency, stage III (moderate), Scr 1.5 in the past 07/25/2011    Class: Chronic  . History of DVT in the past, not on Coumadin now 07/25/2011    Class: History of  . Nephrolithiasis, just saw Dr [name] 07/25/2011    Class: History of  . DYSPHAGIA 03/24/2010  . CAD, s/p LAD and Dx stenting in 2003- patent 2006 and 07/28/11. Residual 80% Dx1 12/03/2007  . RECTAL BLEEDING 12/03/2007  . Diarrhea 12/03/2007  . EPIGASTRIC PAIN 12/03/2007  . Hyperlipidemia 12/02/2007    Class: History of  . EXTERNAL HEMORRHOIDS 12/02/2007    Class: History  of  . ESOPHAGITIS 12/02/2007  . GASTRITIS 12/02/2007  . COLITIS 12/02/2007  . DIVERTICULOSIS, COLON 12/02/2007  . ARTHRITIS 12/02/2007    Class: Chronic   Outpatient Encounter Prescriptions as of 09/15/2013  Medication Sig  . acetaminophen (TYLENOL) 325 MG tablet Take 650 mg by mouth every 6 (six) hours as needed for mild pain or moderate pain.   Marland Kitchen aspirin EC 81 MG tablet Take 81 mg by mouth daily.  . Calcium Citrate-Vitamin D (CITRACAL + D PO) Take 1 tablet by mouth daily.  . carboxymethylcellulose (REFRESH PLUS) 0.5 % SOLN Place 1 drop into both eyes 3 (three) times daily as needed (dry eyes).   . Cholecalciferol (VITAMIN D) 1000 UNITS capsule Take 2,000 Units by mouth daily.   . disopyramide (NORPACE) 100 MG capsule Take 100 mg by mouth 2 (two) times daily. Patient takes doses 12 hours apart at 1000am and 2200pm  . lisinopril (PRINIVIL,ZESTRIL) 10 MG tablet Take 1 tablet (10 mg total) by mouth 2 (two) times daily. Patient takes doses 12 hours apart at 1000am and 2200pm  . metoprolol tartrate (LOPRESSOR) 25 MG tablet Take 0.5 tablets (12.5 mg total) by mouth 2 (two) times daily. Patient takes doses 12 hours apart at 1000am and 2200pm  . Multiple Vitamin (MULTIVITAMIN WITH MINERALS) TABS tablet Take 1 tablet by mouth daily.  . pantoprazole (PROTONIX) 40 MG tablet Take 1 tablet (40 mg total) by mouth daily.  Marland Kitchen PARoxetine (PAXIL) 20 MG tablet Take 1 tablet (20 mg total) by mouth every morning.  Marland Kitchen  rosuvastatin (CRESTOR) 40 MG tablet Take 20 mg by mouth every evening.   . torsemide (DEMADEX) 20 MG tablet Take 20 mg by mouth daily as needed (edema).  . [DISCONTINUED] HYDROcodone-acetaminophen (NORCO) 5-325 MG per tablet Take 1 tablet by mouth every 6 (six) hours as needed for moderate pain.  . [DISCONTINUED] phenazopyridine (PYRIDIUM) 100 MG tablet Take 1 tablet (100 mg total) by mouth 3 (three) times daily as needed for pain.    Review of Systems  Constitutional: Negative.   HENT: Negative.    Eyes: Negative.   Respiratory: Negative.   Cardiovascular: Negative.   Gastrointestinal: Negative.   Endocrine: Negative.   Genitourinary: Positive for dysuria (thinks she may have another kidney stone - wrenn sees for this).  Musculoskeletal: Negative.   Skin: Negative.   Allergic/Immunologic: Negative.   Neurological: Positive for light-headedness (from change in Norpace - cardio following this).  Hematological: Negative.   Psychiatric/Behavioral: Negative.        Objective:   Physical Exam  Nursing note and vitals reviewed. Constitutional: She is oriented to person, place, and time. She appears well-developed and well-nourished. No distress.  HENT:  Head: Normocephalic and atraumatic.  Right Ear: External ear normal.  Left Ear: External ear normal.  Nose: Nose normal.  Mouth/Throat: Oropharynx is clear and moist.  Eyes: Conjunctivae and EOM are normal. Pupils are equal, round, and reactive to light. Right eye exhibits no discharge. Left eye exhibits no discharge. No scleral icterus.  Neck: Normal range of motion. Neck supple. No JVD present. No thyromegaly present.  No carotid bruit  Cardiovascular: Normal rate, regular rhythm, normal heart sounds and intact distal pulses.  Exam reveals no gallop and no friction rub.   No murmur heard. At 60 per minute  Pulmonary/Chest: Effort normal and breath sounds normal. No respiratory distress. She has no wheezes. She has no rales. She exhibits no tenderness.  Abdominal: Soft. Bowel sounds are normal. She exhibits no mass. There is tenderness. There is no rebound and no guarding.  Obesity with generalized abdominal tenderness more in the epigastric and right upper quadrant areas  Musculoskeletal: Normal range of motion. She exhibits no edema and no tenderness.  Lymphadenopathy:    She has no cervical adenopathy.  Neurological: She is alert and oriented to person, place, and time. She has normal reflexes. No cranial nerve deficit.    Skin: Skin is warm and dry. No rash noted.  Psychiatric: She has a normal mood and affect. Her behavior is normal. Judgment and thought content normal.    BP 126/74  Pulse 57  Temp(Src) 98 F (36.7 C) (Oral)  Ht 5\' 4"  (1.626 m)  Wt 180 lb (81.647 kg)  BMI 30.88 kg/m2       Assessment & Plan:  1. CAD, s/p LAD and Dx stenting in 2003- patent 2006 and 07/28/11. Residual 80% Dx1  2. Metabolic syndrome  3. Hyperlipidemia  4. Dysuria - POCT UA - Microscopic Only - POCT urinalysis dipstick - Urine culture  5. Generalized abdominal pain   No orders of the defined types were placed in this encounter.    Patient Instructions                       Medicare Annual Wellness Visit  [facility] and the medical providers at [facility] strive to bring you the best medical care.  In doing so we not only want to address your current medical conditions and concerns but also  to detect new conditions early and prevent illness, disease and health-related problems.    Medicare offers a yearly Wellness Visit which allows our clinical staff to assess your need for preventative services including immunizations, lifestyle education, counseling to decrease risk of preventable diseases and screening for fall risk and other medical concerns.    This visit is provided free of charge (no copay) for all Medicare recipients. The clinical pharmacists at [facility] have begun to conduct these Wellness Visits which will also include a thorough review of all your medications.    As you primary medical provider recommend that you make an appointment for your Annual Wellness Visit if you have not done so already this year.  You may set up this appointment before you leave today or you may call back [phone] and schedule an appointment.  Please make sure when you call that you mention that you are scheduling your Annual Wellness Visit with the clinical pharmacist so  that the appointment may be made for the proper length of time.     Continue current medications. Continue good therapeutic lifestyle changes which include good diet and exercise. Fall precautions discussed with patient. If an FOBT was given today- please return it to our front desk. If you are over 16 years old - you may need Prevnar 59 or the adult Pneumonia vaccine.  Flu Shots will be available at our office starting mid- September. Please call and schedule a FLU CLINIC APPOINTMENT.   Continue followups with urology and cardiology and orthopedic For the next few days drink more fluids, avoid caffeine milk cheese ice cream and dairy product Try to eat foods that are more bland and not fried If the abdominal discomfort gets worse call us back or call the urologist   Arrie Senate MD

## 2013-09-16 LAB — URINE CULTURE

## 2013-09-17 ENCOUNTER — Telehealth: Payer: Self-pay | Admitting: Family Medicine

## 2013-09-17 NOTE — Telephone Encounter (Signed)
Message copied by Waverly Ferrari on Wed Sep 17, 2013 10:51 AM ------      Message from: Chipper Herb      Created: Wed Sep 17, 2013  7:45 AM       There was minimal growth of mixed bacterial flora. There is no specific bacteria to treat. If she is taking an antibiotic she should continue and complete the antibiotic. Otherwise no further treatment is necessary. Please send a copy of this report to Dr. Irine Seal ------

## 2013-09-18 NOTE — Telephone Encounter (Signed)
Patient aware.

## 2013-09-28 DIAGNOSIS — E872 Acidosis, unspecified: Secondary | ICD-10-CM | POA: Diagnosis not present

## 2013-09-28 DIAGNOSIS — I959 Hypotension, unspecified: Secondary | ICD-10-CM | POA: Diagnosis not present

## 2013-09-28 DIAGNOSIS — N390 Urinary tract infection, site not specified: Secondary | ICD-10-CM | POA: Diagnosis not present

## 2013-09-28 DIAGNOSIS — I517 Cardiomegaly: Secondary | ICD-10-CM | POA: Diagnosis not present

## 2013-09-28 DIAGNOSIS — I252 Old myocardial infarction: Secondary | ICD-10-CM | POA: Diagnosis not present

## 2013-09-28 DIAGNOSIS — Z95810 Presence of automatic (implantable) cardiac defibrillator: Secondary | ICD-10-CM | POA: Diagnosis not present

## 2013-09-28 DIAGNOSIS — N179 Acute kidney failure, unspecified: Secondary | ICD-10-CM | POA: Diagnosis not present

## 2013-09-28 DIAGNOSIS — I4891 Unspecified atrial fibrillation: Secondary | ICD-10-CM | POA: Diagnosis not present

## 2013-09-28 DIAGNOSIS — I2510 Atherosclerotic heart disease of native coronary artery without angina pectoris: Secondary | ICD-10-CM | POA: Diagnosis not present

## 2013-09-28 DIAGNOSIS — E860 Dehydration: Secondary | ICD-10-CM | POA: Diagnosis not present

## 2013-10-01 ENCOUNTER — Encounter: Payer: Self-pay | Admitting: Gastroenterology

## 2013-10-08 ENCOUNTER — Telehealth: Payer: Self-pay | Admitting: Family Medicine

## 2013-10-08 ENCOUNTER — Telehealth: Payer: Self-pay | Admitting: Cardiovascular Disease

## 2013-10-08 NOTE — Telephone Encounter (Signed)
Pt called in stating that while she was on vaction she went in to Afib, was having palpitations, and her kidneys were beginning to shut down. She would like to discuss this with Dr. Loletha Grayer. Please call  Thanks

## 2013-10-08 NOTE — Telephone Encounter (Signed)
Would like to see her sooner than 10/19, but not before getting the records. I have not yet seen any records from [facility]. Can we try to get them please?

## 2013-10-08 NOTE — Telephone Encounter (Signed)
Spoke with [name], while in [place] last week she went into atrial fib. She was dehydrated and her kidneys started shutting down. She is not on a blood thinner. She reports they were to have sent the notes to [place]. She has an appt on 10-19 and wanted to make sure that was soon enough and if we had gotten the records. Will make dr croitoru aware

## 2013-10-09 NOTE — Telephone Encounter (Signed)
Please have the patient leave a clean catch midstream urine specimen for urinalysis and culture and sensitivity

## 2013-10-09 NOTE — Telephone Encounter (Addendum)
Release of information faxed to [facility] @ [phone].

## 2013-10-09 NOTE — Telephone Encounter (Signed)
Patient went to hospital while on vacation because she had a bad UTI she finishes her last antibiotic today and wants to leave a urine specimen tomorrow is this okay?

## 2013-10-09 NOTE — Telephone Encounter (Signed)
Patient aware.

## 2013-10-13 ENCOUNTER — Other Ambulatory Visit: Payer: Self-pay | Admitting: Family Medicine

## 2013-10-13 NOTE — Telephone Encounter (Signed)
Left message for medical records at [facility] to fax discharge summ if available. Have received all the other records.

## 2013-10-14 NOTE — Telephone Encounter (Signed)
Records received from [facility]. Spoke with pt, Follow up scheduled

## 2013-10-15 ENCOUNTER — Ambulatory Visit (INDEPENDENT_AMBULATORY_CARE_PROVIDER_SITE_OTHER): Payer: Medicare Other | Admitting: Cardiovascular Disease

## 2013-10-15 ENCOUNTER — Encounter: Payer: Self-pay | Admitting: Cardiovascular Disease

## 2013-10-15 VITALS — BP 128/74 | HR 59 | Resp 18 | Ht 64.0 in | Wt 180.7 lb

## 2013-10-15 DIAGNOSIS — R001 Bradycardia, unspecified: Secondary | ICD-10-CM

## 2013-10-15 DIAGNOSIS — I472 Ventricular tachycardia: Secondary | ICD-10-CM | POA: Diagnosis not present

## 2013-10-15 DIAGNOSIS — I259 Chronic ischemic heart disease, unspecified: Secondary | ICD-10-CM

## 2013-10-15 DIAGNOSIS — I495 Sick sinus syndrome: Secondary | ICD-10-CM

## 2013-10-15 DIAGNOSIS — I4729 Other ventricular tachycardia: Secondary | ICD-10-CM

## 2013-10-15 DIAGNOSIS — I480 Paroxysmal atrial fibrillation: Secondary | ICD-10-CM

## 2013-10-15 DIAGNOSIS — I4891 Unspecified atrial fibrillation: Secondary | ICD-10-CM | POA: Diagnosis not present

## 2013-10-15 LAB — MDC_IDC_ENUM_SESS_TYPE_INCLINIC
Brady Statistic RV Percent Paced: 0 %
HighPow Impedance: 48 Ohm
Implantable Pulse Generator Serial Number: 133463
Lead Channel Impedance Value: 852 Ohm
Lead Channel Setting Pacing Amplitude: 2.2 V
MDC IDC MSMT BATTERY VOLTAGE: 2.56 V
MDC IDC MSMT LEADCHNL RV PACING THRESHOLD AMPLITUDE: 0.8 V
MDC IDC MSMT LEADCHNL RV PACING THRESHOLD PULSEWIDTH: 0.5 ms
MDC IDC MSMT LEADCHNL RV SENSING INTR AMPL: 15.3 mV
MDC IDC SET LEADCHNL RV PACING PULSEWIDTH: 0.5 ms
MDC IDC SET ZONE DETECTION INTERVAL: 428 ms
Zone Setting Detection Interval: 273 ms
Zone Setting Detection Interval: 363 ms

## 2013-10-15 LAB — PACEMAKER DEVICE OBSERVATION

## 2013-10-15 MED ORDER — APIXABAN 2.5 MG PO TABS: 2.5000 mg | ORAL_TABLET | Freq: Two times a day (BID) | ORAL | Status: DC

## 2013-10-15 NOTE — Patient Instructions (Signed)
START Eliquis 2.5mg  twice a day.  Cancel your October 21 st appointment.  Dr. Sallyanne Kuster recommends that you schedule a follow-up appointment in: 3 Months.

## 2013-10-15 NOTE — Progress Notes (Signed)
Patient ID: [name], female   DOB: 21-Nov-1931, 80 y.o.   MRN: [id number]     Reason for office visit ICD check, history of VT, CAD  [name] has a history of adrenergically mediated polymorphic VT and received her first implantable defibrillator in 1993. She has been treated with disopyramide for a long time and has not had any defibrillator discharges. Her latest device was implanted in 2009 when she also received a new endocardial defibrillator lead. Much later she developed coronary disease and received stents to the ramus intermedius artery and LAD artery. There has been no evidence of disease progression Renee subsequent cardiac catheterizations in 2006 & 2013.She has normal left ventricular systolic function and no overt heart failure.   She has a history of remote paroxysmal atrial fibrillation around the time of her cardiac arrest in 1993. As far as I know atrial fibrillation have not been recorded since and she had not been receiving anticoagulant therapy. She did have atrial fibrillation earlier this month while admitted to the hospital in [place] for acute pyelonephritis and sepsis. She returned to normal sinus rhythm Renee the time of hospital discharge. While in atrial fibrillation she had rate-related left bundle branch block. She also had transient renal insufficiency with a creatinine of 2.4 improving to 1.36 Renee the time of hospital discharge. She was started on anticoagulation with apixaban, which she has tolerated well so far appear  Incidentally her hemoglobin A1c was 6.2%, total cholesterol 126, triglycerides 140, LDL 63, HDL 35, normal liver function tests. She had elevated plasma lactic acid and normal troponins during that hospitalization.  She has been unable to get sustained-release disopyramide from the hospital and does not feel as well on the immediate release antiarrhythmic. She has had this problem before  Defibrillator interrogation shows normal function.  Several episodes of high ventricular rates recorded between September 13 and 15th, all of them in the VT 1 monitor zone and all likely representing atrial fibrillation rapid ventricular response. Her device is approaching ERI but has not yet reached that point.   Allergies  Allergen Reactions  . Actos [Pioglitazone] Swelling  . Contrast Media [Iodinated Diagnostic Agents] Other (See Comments)    Headache (no action/pre-med required)  . Latex Rash  . Lipitor [Atorvastatin] Other (See Comments)    myalgia  . Protonix [Pantoprazole Sodium] Diarrhea    Current Outpatient Prescriptions  Medication Sig Dispense Refill  . acetaminophen (TYLENOL) 325 MG tablet Take 650 mg Renee mouth every 6 (six) hours as needed for mild pain or moderate pain.       Marland Kitchen aspirin EC 81 MG tablet Take 81 mg Renee mouth daily.      . Calcium Citrate-Vitamin D (CITRACAL + D PO) Take 1 tablet Renee mouth daily.      . carboxymethylcellulose (REFRESH PLUS) 0.5 % SOLN Place 1 drop into both eyes 3 (three) times daily as needed (dry eyes).       . Cholecalciferol (VITAMIN D) 1000 UNITS capsule Take 2,000 Units Renee mouth daily.       . disopyramide (NORPACE) 100 MG capsule TAKE (2) CAPSULES TWICE DAILY.  120 capsule  3  . lisinopril (PRINIVIL,ZESTRIL) 10 MG tablet Take 1 tablet (10 mg total) Renee mouth 2 (two) times daily. Patient takes doses 12 hours apart at 1000am and 2200pm  60 tablet  4  . metoprolol tartrate (LOPRESSOR) 25 MG tablet Take 0.5 tablets (12.5 mg total) Renee mouth 2 (two) times daily. Patient takes doses  12 hours apart at 1000am and 2200pm  90 tablet  3  . Multiple Vitamin (MULTIVITAMIN WITH MINERALS) TABS tablet Take 1 tablet Renee mouth daily.      . pantoprazole (PROTONIX) 40 MG tablet Take 1 tablet (40 mg total) Renee mouth daily.  90 tablet  3  . PARoxetine (PAXIL) 20 MG tablet Take 1 tablet (20 mg total) Renee mouth every morning.  90 tablet  0  . rosuvastatin (CRESTOR) 40 MG tablet Take 20 mg Renee mouth every evening.         . torsemide (DEMADEX) 20 MG tablet Take 20 mg Renee mouth daily as needed (edema).       No current facility-administered medications for this visit.    Past Medical History  Diagnosis Date  . ICD (implantable cardiac defibrillator) in place 93 02/05/07    [facility] pt states history of atrial fib/vent tachy-cardiac arrest 93 when icd placed. preop prescription for implantable device faxed to [facility]  . Hypertension     EKG done 1 week ago [facility]-requested  . GERD (gastroesophageal reflux disease)     omeprazole  . Dysrhythmia   . Diverticulosis of colon (without mention of hemorrhage) 2007    Colonoscopy   . Myocardial infarction     history of cardiac arrest 93-stents, on plavix-d/c 5 days prior to surger  . H/O hiatal hernia   . Orthostatic hypotension 07/27/2011  . Chronic sinus bradycardia 07/25/2011  . CAD, s/p LAD and Dx stenting in 2003- patent 2006, Myoview low risk 2010. NL LVF 2D 10/12 12/03/2007    Qualifier: Diagnosis of  Renee: Sharlett Iles MD Byrd [name]   . Chronic renal insufficiency, stage III (moderate), Scr 1.5 in the past 07/25/2011  . History of DVT in the past, not on Coumadin now 07/25/2011    left  leg  . HYPERCHOLESTEROLEMIA 12/02/2007    Qualifier: Diagnosis of  Renee: Nils Pyle CMA (AAMA), [name]    . Nephrolithiasis, just saw Dr [name] 07/25/2011  . EXTERNAL HEMORRHOIDS 12/02/2007    Qualifier: Diagnosis of  Renee: Nils Pyle CMA ([place]), [name]    . ARTHRITIS 12/02/2007    Qualifier: Diagnosis of  Renee: Nils Pyle CMA ([place]), [name]    . Esophagitis, unspecified 2003    EGD  . Unspecified gastritis and gastroduodenitis without mention of hemorrhage 2003    EGD  . Macular degeneration     gets injecton in eye every 5 weeks- last injection - 05/03/2013   . Arthritis   . Peripheral vascular disease     related to blood clot  due to ICD- 2009   . Automatic implantable cardioverter-defibrillator in situ     Past Surgical History  Procedure Laterality Date  . Cardiac  defibrillator placement  02/05/2007    [facility]  . Hemorroidectomy  80's  . Hysteroscopy w/d&c  09/02/2010    Procedure: DILATATION AND CURETTAGE (D&C) /HYSTEROSCOPY;  Surgeon: Margarette Asal;  Location: [facility];  Service: Gynecology;  Laterality: N/A;  Dilation and Curettage with Hysteroscopy and Polypectomy  . Cardiac catheterization    . Coronary stents     . Cystoscopy with ureteroscopy Right 05/20/2013    Procedure: CYSTOSCOPY, RIGHT URETEROSCOPY STONE EXTRACTION, Insertion of right DOUBLE J STENT ;  Surgeon: Irine Seal, MD;  Location: [facility];  Service: Urology;  Laterality: Right;  . Holmium laser application Right [id number]    Procedure: HOLMIUM LASER APPLICATION;  Surgeon: Irine Seal, MD;  Location: [facility];  Service: Urology;  Laterality: Right;  .  Cystoscopy with ureteroscopy and stent placement N/A 06/03/2013    Procedure: SECOND LOOK CYSTOSCOPY WITH URETEROSCOPY  HOLMIUM LASER LITHO AND STONE EXTRACTION Sammie Bench ;  Surgeon: Malka So, MD;  Location: [facility];  Service: Urology;  Laterality: N/A;    Family History  Problem Relation Age of Onset  . Stroke Mother   . Other Mother     brain tumor  . Other Father     MI    History   Social History  . Marital Status: Widowed    Spouse Name: N/A    Number of Children: N/A  . Years of Education: N/A   Occupational History  . Not on file.   Social History Main Topics  . Smoking status: Never Smoker   . Smokeless tobacco: Never Used  . Alcohol Use: No  . Drug Use: No  . Sexual Activity: No   Other Topics Concern  . Not on file   Social History Narrative  . No narrative on file    Review of systems: The patient specifically denies any chest pain at rest or with exertion, dyspnea at rest or with exertion, orthopnea, paroxysmal nocturnal dyspnea, syncope, palpitations, focal neurological deficits, intermittent claudication, lower extremity edema, unexplained weight gain, cough, hemoptysis or wheezing.  The patient also  denies abdominal pain, nausea, vomiting, dysphagia, constipation, polyuria, polydipsia, dysuria, hematuria, frequency, urgency, abnormal bleeding or bruising, fever, chills, unexpected weight changes, mood swings, change in skin or hair texture, change in voice quality, auditory or visual problems, allergic reactions or rashes, new musculoskeletal complaints other than usual "aches and pains".   PHYSICAL EXAM BP 128/74  Pulse 59  Resp 18  Ht 5\' 4"  (1.626 m)  Wt 81.965 kg (180 lb 11.2 oz)  BMI 31.00 kg/m2 General: Alert, oriented x3, no distress  Head: no evidence of trauma, PERRL, EOMI, no exophtalmos or lid lag, no myxedema, no xanthelasma; normal ears, nose and oropharynx  Neck: normal jugular venous pulsations and no hepatojugular reflux; brisk carotid pulses without delay and no carotid bruits  Chest: clear to auscultation, no signs of consolidation Renee percussion or palpation, normal fremitus, symmetrical and full respiratory excursions; and right subclavian scar is, healthy defibrillator site in the right subclavian area  Cardiovascular: normal position and quality of the apical impulse, regular rhythm, normal first and second heart sounds, no murmurs, rubs or gallops  Abdomen: no tenderness or distention, no masses Renee palpation, no abnormal pulsatility or arterial bruits, normal bowel sounds, no hepatosplenomegaly  Extremities: no clubbing, cyanosis; trivial ankle edema; bilateral mild stasis dermatitis;varicose veins more obvious on the right than on the left; 2+ radial, ulnar and brachial pulses bilaterally; 2+ right femoral, posterior tibial and dorsalis pedis pulses; 2+ left femoral, posterior tibial and dorsalis pedis pulses; no subclavian or femoral bruits  Neurological: grossly nonfocal  EKG: Sinus rhythm with first degree AV block, nonspecific intraventricular conduction with left axis deviation an appearance most consistent with a left bundle branch block he appearance is unchanged  from earlier this year and she has had a broad QRS at least for the last couple of years.    EKG:  [facility] ECG in [facility] shows atrial fibrillation with left bundle branch block, QRS 157 ms, marked left axis deviation  Second ECG shows sinus rhythm with a nonspecific interventricular conduction delay QRS 129 ms and a prolonged PR interval  Lipid Panel     Component Value Date/Time   CHOL 147 09/09/2013 0841   TRIG 134  09/09/2013 0841   TRIG 161* 07/31/2012 1019   TRIG 169* 09/02/2010 0629   HDL 55 09/09/2013 0841   HDL 50 09/02/2010 0629   CHOLHDL 2.8 09/02/2010 0629   VLDL 34 09/02/2010 0629   LDLCALC 65 09/09/2013 0841   LDLCALC 65 07/31/2012 1019   LDLCALC 58 09/02/2010 0629    BMET    Component Value Date/Time   NA 141 09/09/2013 0841   NA 138 06/03/2013 0910   K 4.5 09/09/2013 0841   CL 103 09/09/2013 0841   CO2 23 09/09/2013 0841   GLUCOSE 94 09/09/2013 0841   GLUCOSE 113* 06/03/2013 0910   BUN 25 09/09/2013 0841   BUN 21 06/03/2013 0910   CREATININE 1.46* 09/09/2013 0841   CREATININE 1.52* 07/31/2012 1019   CALCIUM 9.5 09/09/2013 0841   GFRNONAA 34* 09/09/2013 0841   GFRNONAA 33* 07/31/2012 1019   GFRAA 39* 09/09/2013 0841   GFRAA 38* 07/31/2012 1019     ASSESSMENT AND PLAN Paroxysmal atrial fibrillation Associated with acute infection/sepsis, but has previously occurred and her ICD has recorded brief episodes of PAF in the past.. Continue anticoagulation. Likelihood of recurrence is high and CHADSVasc score is 5. Disopyramide should help reduce the prevalence of events. Continue beta blocker as well.  CAD, s/p LAD and Dx stenting in 2003- patent 2006 and 07/28/11. Residual 80% Dx1  No signs or symptoms of coronary insufficiency or heart failure at this time.   HYPERCHOLESTEROLEMIA  Generally she has excellent lipid profile with exception of borderline hypertriglyceridemia.   Automatic implantable cardioverter-defibrillator in situ  Normal device function. Excellent lead  parameters. Her generator is approaching ERI but the battery voltage the case has been very slow and it is likely we'll take more than 6 months until we reach that point. No programming changes were made. The device appropriately discriminated the supraventricular mechanism for her tachycardia and the ventricular rate did not really approach therapy zones.  Holli Humbles, MD, [place] [phone] office [phone] pager

## 2013-10-16 ENCOUNTER — Encounter: Payer: Self-pay | Admitting: Cardiovascular Disease

## 2013-10-16 DIAGNOSIS — I472 Ventricular tachycardia: Secondary | ICD-10-CM | POA: Insufficient documentation

## 2013-10-16 DIAGNOSIS — I4729 Other ventricular tachycardia: Secondary | ICD-10-CM | POA: Insufficient documentation

## 2013-10-16 DIAGNOSIS — I480 Paroxysmal atrial fibrillation: Secondary | ICD-10-CM | POA: Insufficient documentation

## 2013-10-22 DIAGNOSIS — H3532 Exudative age-related macular degeneration: Secondary | ICD-10-CM | POA: Diagnosis not present

## 2013-10-22 DIAGNOSIS — H3570 Unspecified separation of retinal layers: Secondary | ICD-10-CM | POA: Diagnosis not present

## 2013-10-24 ENCOUNTER — Ambulatory Visit
Admission: RE | Admit: 2013-10-24 | Discharge: 2013-10-24 | Disposition: A | Discharge status: Home / Self Care | Payer: Medicare Other | Source: Ambulatory Visit

## 2013-10-24 DIAGNOSIS — Z1231 Encounter for screening mammogram for malignant neoplasm of breast: Secondary | ICD-10-CM

## 2013-10-27 ENCOUNTER — Telehealth: Payer: Self-pay | Admitting: Family Medicine

## 2013-10-27 NOTE — Telephone Encounter (Signed)
Call given to triage

## 2013-10-28 ENCOUNTER — Telehealth: Payer: Self-pay

## 2013-10-28 ENCOUNTER — Other Ambulatory Visit: Payer: Self-pay | Admitting: Family Medicine

## 2013-10-28 DIAGNOSIS — R928 Other abnormal and inconclusive findings on diagnostic imaging of breast: Secondary | ICD-10-CM

## 2013-10-28 NOTE — Telephone Encounter (Signed)
Pt aware of results and is scheduled for repeat mammo on 11/04/13 at 8:30 am

## 2013-10-28 NOTE — Telephone Encounter (Signed)
Message copied by Koren Bound on Tue Oct 28, 2013  9:28 AM ------      Message from: [name]      Created: Mon Oct 27, 2013  1:01 PM       As per radiology report------- please make sure that patient received a phone call to make her aware that additional imaging is necessary ------

## 2013-10-29 ENCOUNTER — Encounter: Payer: Self-pay | Admitting: Cardiovascular Disease

## 2013-10-31 ENCOUNTER — Telehealth: Payer: Self-pay | Admitting: Cardiovascular Disease

## 2013-10-31 ENCOUNTER — Other Ambulatory Visit: Payer: Self-pay

## 2013-10-31 NOTE — Telephone Encounter (Signed)
Spoke with patient who reports palpitations starting every morning lasting until about dinnertime. They are more consistent in AM. She reports they are not as bad today. She states the palpitations have been going on since last OV on 9/30 (of note, per documentation patient had c/o palps, weight on chest, SOB with exertion @ OV). Her daily activities are not affected by the palpitations, but are more noticeable. She does report she is taking her medications as prescribed, but feels dizzy with the short released Norpace and notes difficulty in getting the sustained release. She wanted to know if this is normal, since she has PAF/AF and if any changes to her medications are necessary.   She currently has no other complains.

## 2013-10-31 NOTE — Telephone Encounter (Signed)
Please call,have palpitations every morning and sometimes in the afternoon.Is this normal,since she has tachycardia?

## 2013-11-03 ENCOUNTER — Encounter: Payer: Medicare Other | Admitting: Cardiovascular Disease

## 2013-11-03 MED ORDER — METOPROLOL TARTRATE 25 MG PO TABS: 25.0000 mg | ORAL_TABLET | Freq: Two times a day (BID) | ORAL | Status: DC

## 2013-11-03 NOTE — Telephone Encounter (Signed)
Returned call to patient. She reports a good weekend. She reports she did have an episode this AM that lasted a few hours and was easing off.  Patient can be seen in [facility] Monday 10/26 @ 3:30pm

## 2013-11-03 NOTE — Addendum Note (Signed)
Addended by: Fidel Levy on: 11/03/2013 12:11 PM   Modules accepted: Orders

## 2013-11-03 NOTE — Telephone Encounter (Signed)
I don't think her defibrillator can do remote downloads - she should do a remote if possible. If not, please have her come in to device clinic [facility].  is okay) to have the device interrogated. Increase the metoprolol to 37.5 mg twice a day.

## 2013-11-03 NOTE — Telephone Encounter (Signed)
Patient to increase metoprolol to 25mg  BID.  Rx was sent to pharmacy electronically.   Patient wishes to reschedule device clinic appmt as she has to have someone bring her to appmt. Gave her [employer] office number so she can call to reschedule

## 2013-11-04 ENCOUNTER — Ambulatory Visit
Admission: RE | Admit: 2013-11-04 | Discharge: 2013-11-04 | Disposition: A | Discharge status: Home / Self Care | Payer: Medicare Other | Source: Ambulatory Visit | Attending: Family Medicine | Admitting: Family Medicine

## 2013-11-04 ENCOUNTER — Other Ambulatory Visit: Payer: Self-pay | Admitting: Family Medicine

## 2013-11-04 DIAGNOSIS — R928 Other abnormal and inconclusive findings on diagnostic imaging of breast: Secondary | ICD-10-CM

## 2013-11-04 DIAGNOSIS — R921 Mammographic calcification found on diagnostic imaging of breast: Secondary | ICD-10-CM

## 2013-11-11 ENCOUNTER — Ambulatory Visit (INDEPENDENT_AMBULATORY_CARE_PROVIDER_SITE_OTHER): Payer: Medicare Other | Admitting: *Deleted

## 2013-11-11 ENCOUNTER — Ambulatory Visit (INDEPENDENT_AMBULATORY_CARE_PROVIDER_SITE_OTHER): Payer: Medicare Other

## 2013-11-11 VITALS — BP 139/77 | HR 55

## 2013-11-11 DIAGNOSIS — Z23 Encounter for immunization: Secondary | ICD-10-CM

## 2013-11-11 DIAGNOSIS — R002 Palpitations: Secondary | ICD-10-CM | POA: Diagnosis not present

## 2013-11-11 NOTE — Progress Notes (Signed)
Patient ID: [name], female   DOB: 1931/02/02, 80 y.o.   MRN: [id number] Patient came in today complaining with palpitations and EKG was done. EKG was reviewed by Ronnald Collum, FNP and stated it was unchanged from previous EKG and that if the palpitations continue then she needed to contact her cardiologist. Patient verbalized understanding.

## 2013-11-14 ENCOUNTER — Other Ambulatory Visit: Payer: Self-pay | Admitting: Family Medicine

## 2013-11-16 HISTORY — PX: BREAST BIOPSY: SHX20

## 2013-11-17 ENCOUNTER — Telehealth: Payer: Self-pay | Admitting: Cardiovascular Disease

## 2013-11-17 NOTE — Telephone Encounter (Signed)
Please call,having a biopsy on Thursday. Does she need to stop her Eliquis?

## 2013-11-17 NOTE — Telephone Encounter (Signed)
Please hold Eliquis for 48 hours before biopsy (last dose on Tuesday morning) MCr

## 2013-11-17 NOTE — Telephone Encounter (Signed)
BUSY 

## 2013-11-17 NOTE — Telephone Encounter (Signed)
Pt states she is having a breast biopsy Thursday at [facility]. She was asked by MD doing breast biopsy to call Dr Sallyanne Kuster to get recommendations about holding Eliquis prior to biopsy.  Pt advised I am forwarding to Dr Sallyanne Kuster for review and recommendations.

## 2013-11-17 NOTE — Telephone Encounter (Signed)
Pt.notified

## 2013-11-20 ENCOUNTER — Ambulatory Visit (INDEPENDENT_AMBULATORY_CARE_PROVIDER_SITE_OTHER): Payer: Medicare Other | Admitting: *Deleted

## 2013-11-20 ENCOUNTER — Ambulatory Visit
Admission: RE | Admit: 2013-11-20 | Discharge: 2013-11-20 | Disposition: A | Discharge status: Home / Self Care | Payer: Medicare Other | Source: Ambulatory Visit | Attending: Family Medicine | Admitting: Family Medicine

## 2013-11-20 DIAGNOSIS — I472 Ventricular tachycardia: Secondary | ICD-10-CM | POA: Diagnosis not present

## 2013-11-20 DIAGNOSIS — R921 Mammographic calcification found on diagnostic imaging of breast: Secondary | ICD-10-CM

## 2013-11-20 DIAGNOSIS — N6012 Diffuse cystic mastopathy of left breast: Secondary | ICD-10-CM | POA: Diagnosis not present

## 2013-11-20 DIAGNOSIS — I4729 Other ventricular tachycardia: Secondary | ICD-10-CM

## 2013-11-20 LAB — MDC_IDC_ENUM_SESS_TYPE_INCLINIC
Battery Voltage: 2.56 V
Brady Statistic RV Percent Paced: 2 %
HIGH POWER IMPEDANCE MEASURED VALUE: 48 Ohm
HighPow Impedance: 40 Ohm
Implantable Pulse Generator Serial Number: 133463
Lead Channel Impedance Value: 811 Ohm
Lead Channel Pacing Threshold Amplitude: 0.8 V
Lead Channel Pacing Threshold Amplitude: 0.8 V
Lead Channel Pacing Threshold Amplitude: 0.8 V
Lead Channel Pacing Threshold Amplitude: 0.8 V
Lead Channel Pacing Threshold Pulse Width: 0.5 ms
Lead Channel Pacing Threshold Pulse Width: 0.5 ms
Lead Channel Pacing Threshold Pulse Width: 0.5 ms
Lead Channel Sensing Intrinsic Amplitude: 13.5 mV
Lead Channel Setting Pacing Amplitude: 2.2 V
Lead Channel Setting Pacing Pulse Width: 0.5 ms
MDC IDC MSMT LEADCHNL RV PACING THRESHOLD PULSEWIDTH: 0.5 ms
MDC IDC SESS DTM: 20151105050000
MDC IDC SET ZONE DETECTION INTERVAL: 273 ms
MDC IDC SET ZONE DETECTION INTERVAL: 463 ms
Zone Setting Detection Interval: 363 ms

## 2013-11-20 NOTE — Progress Notes (Signed)
ICD check in clinic. Normal device function. Threshold and sensing consistent with previous device measurements. Impedance trends stable over time. No evidence of any ventricular arrhythmias since last session. Histogram distribution appropriate for patient and level of activity. No changes made this session. Device programmed at appropriate safety margins. Device programmed to optimize intrinsic conduction. Batt voltage 2.56V (ERI 2.49V). Plan to follow up with [facility] on 01-20-2014 @ 8:45am.

## 2013-11-24 ENCOUNTER — Telehealth: Payer: Self-pay | Admitting: Cardiovascular Disease

## 2013-11-24 NOTE — Telephone Encounter (Signed)
Pt needs a prior authorization form filled out for medicine.

## 2013-11-25 ENCOUNTER — Encounter: Payer: Self-pay | Admitting: Cardiovascular Disease

## 2013-11-25 NOTE — Telephone Encounter (Signed)
After Dec.31st insurance will need prior Auth for Norpace, patients DIL thought this was a process she needed to do now. I explained she would just send in a refill request as usual and we would take care of the P.A. When we received the paper work from the pharmacy. DIL voiced understanding.

## 2013-11-25 NOTE — Telephone Encounter (Signed)
[name] called again today,says she was still waiting to hear something from the nurse.

## 2013-12-07 ENCOUNTER — Telehealth: Payer: Self-pay | Admitting: Cardiology

## 2013-12-07 ENCOUNTER — Telehealth: Payer: Self-pay | Admitting: Physician Assistant

## 2013-12-07 NOTE — Telephone Encounter (Signed)
Patient call the after hour cardiology service as her device was beeping for 5 min over a hour ago. It has since stopped. Her ICD was last replaced in 2009 with new lead. It was interrogated on 11/5 and she was told she had 6 month battery left.   I have discussed with patient regarding the need to figure out exactly what caused the device to go off, I told her if she is feeling bad or alarm go off again, she should come to ED where we can interrogate the device, otherwise, if she is feeling ok, she should call first tomorrow to have the device interrogated in the ED.  Hilbert Corrigan PA Pager: [phone]

## 2013-12-07 NOTE — Telephone Encounter (Signed)
Paged re ongoing ICD beeping and hypertension to [id number] systolic.  [name] states she checked her BP this evening because she "just felt like she needed to" and found it to be elevated to 220. No med or diet changes. Nothing else out of the ordinary. Feels a slight pressure sensation on the left side of her chest.  She asks if she needs to come in.   We discussed that her hypertension does not change the fact that it is okay to evaluate the ICD tomorrow. I suggested she take her evening lisinopril dose now, plus a nitroglycerine tablet. She can repeat the SL NTG x 2 if she still has some chest discomfort and as long as the chest discomfort subsides, check there BP in an hour. If the BP is okay at that time, she does not need further evaluation tonight. All questions were answered. She is in agreement with the plan.

## 2013-12-08 ENCOUNTER — Ambulatory Visit: Payer: Medicare Other | Admitting: *Deleted

## 2013-12-08 ENCOUNTER — Encounter: Payer: Self-pay | Admitting: Nurse Practitioner

## 2013-12-08 ENCOUNTER — Ambulatory Visit (INDEPENDENT_AMBULATORY_CARE_PROVIDER_SITE_OTHER): Payer: Medicare Other | Admitting: Nurse Practitioner

## 2013-12-08 ENCOUNTER — Telehealth: Payer: Self-pay | Admitting: Cardiovascular Disease

## 2013-12-08 VITALS — BP 180/87 | HR 56 | Ht 64.0 in | Wt 180.0 lb

## 2013-12-08 DIAGNOSIS — Z79899 Other long term (current) drug therapy: Secondary | ICD-10-CM | POA: Diagnosis not present

## 2013-12-08 DIAGNOSIS — I429 Cardiomyopathy, unspecified: Secondary | ICD-10-CM

## 2013-12-08 DIAGNOSIS — R079 Chest pain, unspecified: Secondary | ICD-10-CM

## 2013-12-08 DIAGNOSIS — I10 Essential (primary) hypertension: Secondary | ICD-10-CM

## 2013-12-08 DIAGNOSIS — I259 Chronic ischemic heart disease, unspecified: Secondary | ICD-10-CM

## 2013-12-08 DIAGNOSIS — R0609 Other forms of dyspnea: Secondary | ICD-10-CM | POA: Diagnosis not present

## 2013-12-08 LAB — MDC_IDC_ENUM_SESS_TYPE_INCLINIC
HIGH POWER IMPEDANCE MEASURED VALUE: 45 Ohm
Implantable Pulse Generator Serial Number: 133463
Lead Channel Impedance Value: 811 Ohm
Lead Channel Sensing Intrinsic Amplitude: 13.1 mV
MDC IDC MSMT LEADCHNL RV PACING THRESHOLD AMPLITUDE: 0.8 V
MDC IDC MSMT LEADCHNL RV PACING THRESHOLD PULSEWIDTH: 0.5 ms
MDC IDC SET LEADCHNL RV PACING AMPLITUDE: 2.2 V
MDC IDC SET LEADCHNL RV PACING PULSEWIDTH: 0.5 ms
MDC IDC SET ZONE DETECTION INTERVAL: 273 ms
MDC IDC SET ZONE DETECTION INTERVAL: 463 ms
MDC IDC STAT BRADY RV PERCENT PACED: 3 %
Zone Setting Detection Interval: 363 ms

## 2013-12-08 LAB — CBC
HEMATOCRIT: 40.2 % (ref 36.0–46.0)
HEMOGLOBIN: 13 g/dL (ref 12.0–15.0)
MCHC: 32.4 g/dL (ref 30.0–36.0)
MCV: 94.6 fl (ref 78.0–100.0)
PLATELETS: 240 10*3/uL (ref 150.0–400.0)
RBC: 4.25 Mil/uL (ref 3.87–5.11)
RDW: 14.2 % (ref 11.5–15.5)
WBC: 6.6 10*3/uL (ref 4.0–10.5)

## 2013-12-08 LAB — BASIC METABOLIC PANEL
BUN: 18 mg/dL (ref 6–23)
CALCIUM: 9.3 mg/dL (ref 8.4–10.5)
CO2: 21 meq/L (ref 19–32)
CREATININE: 1.2 mg/dL (ref 0.4–1.2)
Chloride: 105 mEq/L (ref 96–112)
GFR: 45.49 mL/min — ABNORMAL LOW (ref >60.00)
GLUCOSE: 66 mg/dL — AB (ref 70–99)
Potassium: 4.5 mEq/L (ref 3.5–5.1)
SODIUM: 137 meq/L (ref 135–145)

## 2013-12-08 MED ORDER — LISINOPRIL 20 MG PO TABS: 20.0000 mg | ORAL_TABLET | Freq: Two times a day (BID) | ORAL | Status: DC

## 2013-12-08 NOTE — Telephone Encounter (Signed)
[name] is calling because her defibrillator has been beeping and her blood pressure is up . (Was very high on Last night ) Please call   Thanks

## 2013-12-08 NOTE — Patient Instructions (Signed)
Please increase your Lisinopril to 20 mg twice a day. Continue all other medications as listed.  Please have blood work today (CBC,BMP)  Your physician has requested that you have an echocardiogram. Echocardiography is a painless test that uses sound waves to create images of your heart. It provides your doctor with information about the size and shape of your heart and how well your heart's chambers and valves are working. This procedure takes approximately one hour. There are no restrictions for this procedure.  Your physician has requested that you have a lexiscan myoview. For further information please visit [email or web address]. Please follow instruction sheet, as given.  Follow up as scheduled.

## 2013-12-08 NOTE — Telephone Encounter (Signed)
Spoke with pt, her bp was running high yesterday. 9pm= 220/105, she talked to the person on call and they told her to take her bp meds and one NTG to get it down. She usually takes her medicine 10a-10p. Her bp came down to 160/90. She slept well and feels okay this morning, she has a headache, she has not checked her bp this morning. She reports having palpitations about every morning.  She also reports her device started beeping yesterday.  Will forward to dr croitoru for bp and to the device clinic regarding device beeping.

## 2013-12-08 NOTE — Progress Notes (Signed)
Patient Name: [name] Date of Encounter: 12/08/2013  Primary Care Provider:  Redge Gainer, MD Primary Cardiologist:  Croitoru  Patient Profile  [name] is a 80 y.o. female with prior non-ischemic cardiomyopathy, VF arrest s/p ICD implant originally 1993, CAD with last cath 2013, PAF, and hypertension who presents today for concerns about hypertension.   Problem List   Past Medical History  Diagnosis Date  . ICD (implantable cardiac defibrillator) in place     a. s/p initial ICD in 1993 in setting of cardiac arrest;  b. 01/2007 gen change: Guidant T135 Vitality DS VR single lead ICD.  Marland Kitchen Hypertension   . GERD (gastroesophageal reflux disease)     omeprazole  . Diverticulosis of colon (without mention of hemorrhage) 2007    Colonoscopy   . Myocardial infarction     a. history of cardiac arrest 1993;  b. s/p LAD/LCX stenting in 2003;  c. 07/2011 Cath: LM nl, LAD patent stent, D1 80ost, LCX patent stent, RCA min irregs;  d. 04/2012 MV: EF 66%, no ishcemia.  . H/O hiatal hernia   . Orthostatic hypotension   . Chronic sinus bradycardia   . CKD (chronic kidney disease), stage III   . History of DVT in the past, not on Coumadin now     left  leg  . HYPERCHOLESTEROLEMIA   . Nephrolithiasis, just saw Dr [name]   . EXTERNAL HEMORRHOIDS   . ARTHRITIS   . Esophagitis, unspecified     a. 2012 EGD  . Unspecified gastritis and gastroduodenitis without mention of hemorrhage     a. 2003 EGD->not noted on 2012 EGD.  . Macular degeneration     gets injecton in eye every 5 weeks- last injection - 05/03/2013   . Arthritis   . Peripheral vascular disease     ???  . Hiatal hernia     a. 2012 EGD.  Marland Kitchen Esophageal stricture     a. 2012 s/p dil.   Past Surgical History  Procedure Laterality Date  . Cardiac defibrillator placement  02/05/2007    Guidant  . Hemorroidectomy  80's  . Hysteroscopy w/d&c  09/02/2010    Procedure: DILATATION AND CURETTAGE (D&C) /HYSTEROSCOPY;   Surgeon: Margarette Asal;  Location: [facility];  Service: Gynecology;  Laterality: N/A;  Dilation and Curettage with Hysteroscopy and Polypectomy  . Cardiac catheterization    . Coronary stents     . Cystoscopy with ureteroscopy Right 05/20/2013    Procedure: CYSTOSCOPY, RIGHT URETEROSCOPY STONE EXTRACTION, Insertion of right DOUBLE J STENT ;  Surgeon: Irine Seal, MD;  Location: [facility];  Service: Urology;  Laterality: Right;  . Holmium laser application Right [id number]    Procedure: HOLMIUM LASER APPLICATION;  Surgeon: Irine Seal, MD;  Location: [facility];  Service: Urology;  Laterality: Right;  . Cystoscopy with ureteroscopy and stent placement N/A 06/03/2013    Procedure: SECOND LOOK CYSTOSCOPY WITH URETEROSCOPY  HOLMIUM LASER LITHO AND STONE EXTRACTION Sammie Bench ;  Surgeon: Malka So, MD;  Location: [facility];  Service: Urology;  Laterality: N/A;    Allergies  Allergies  Allergen Reactions  . Actos [Pioglitazone] Swelling  . Contrast Media [Iodinated Diagnostic Agents] Other (See Comments)    Headache (no action/pre-med required)  . Latex Rash  . Lipitor [Atorvastatin] Other (See Comments)    myalgia  . Protonix [Pantoprazole Sodium] Diarrhea    HPI  [name] is a 80 y.o. female with the above problem list.  She has a cardiac history dating back to 1993 at which time she had a VF arrest and was found to have a non-ischemic cardiomyopathy.  She was transferred to [facility] where she underwent ICD implantation.  She has been followed by Dr Rollene Fare and subsequently Dr Sallyanne Kuster.  She was found to have CAD and underwent PCI to LAD and circumflex in July 2003.  Subsequent catheterizations in 2006 and 2013 demonstrated stent patency.   She was admitted 09/2013 in [place] with atrial fibrillation, pyelonephritis and sepsis.  She spontaneously converted to sinus rhythm and was subsequently discharged.  She reports an ICD shock 2 days later for which she did not seek medical attention.  She was  last seen in the office by Dr Sallyanne Kuster 10/15/2013 at which time Eliquis was added for atrial fibrillation. Her device was interrogated and found to be functioning normally without recent ICD discharges.  She was approaching ERI but had 6 months longevity remaining.    On Saturday, 12/06/13, her ICD started beeping and she states that she became anxious because of this.  Her blood pressure has been elevated at home since with readings of 220/100's.  She reports compliance with current medications.  Typically her SBP runs 120-140 per her report.    Today, she also says that ever since September when she was hospitalized, she has been having more DOE than usual.  She denies pnd, orthopnea, n, v, dizziness, syncope, edema, weight gain, or early satiety.  Over the past week or so, she has also noted intermittent left upper chest heaviness ("like a weight") occurring predominantly @ rest w/o associated Ss and can last up to an entire day.  Discomfort seems to be surrounding her ICD but the area has not been tender.  She also has had occasional palpitations w/o presyncope.    Finally, since starting on eliquis, she has noted a darkening of her stools.  She denies BRBPR and has had no orthostatic symptoms.   Home Medications  Prior to Admission medications   Medication Sig Start Date End Date Taking? Authorizing Provider  acetaminophen (TYLENOL) 325 MG tablet Take 650 mg by mouth every 6 (six) hours as needed for mild pain or moderate pain.    Yes Historical Provider, MD  apixaban (ELIQUIS) 2.5 MG TABS tablet Take 1 tablet (2.5 mg total) by mouth 2 (two) times daily. 10/15/13  Yes Mihai Croitoru, MD  aspirin EC 81 MG tablet Take 81 mg by mouth daily.   Yes Historical Provider, MD  Calcium Citrate-Vitamin D (CITRACAL + D PO) Take 1 tablet by mouth daily.   Yes Historical Provider, MD  carboxymethylcellulose (REFRESH PLUS) 0.5 % SOLN Place 1 drop into both eyes 3 (three) times daily as needed (dry eyes).    Yes  Historical Provider, MD  Cholecalciferol (VITAMIN D) 1000 UNITS capsule Take 2,000 Units by mouth daily.    Yes Historical Provider, MD  disopyramide (NORPACE) 100 MG capsule TAKE (2) CAPSULES TWICE DAILY. 10/14/13  Yes Chipper Herb, MD  lisinopril (PRINIVIL,ZESTRIL) 10 MG tablet Take 1 tablet (10 mg total) by mouth 2 (two) times daily. Patient takes doses 12 hours apart at 1000am and 2200pm 06/18/13  Yes Chipper Herb, MD  metoprolol tartrate (LOPRESSOR) 25 MG tablet Take 1 tablet (25 mg total) by mouth 2 (two) times daily. Patient takes doses 12 hours apart at 1000am and 2200pm 11/03/13  Yes Mihai Croitoru, MD  Multiple Vitamin (MULTIVITAMIN WITH MINERALS) TABS tablet Take 1 tablet by mouth  daily.   Yes Historical Provider, MD  pantoprazole (PROTONIX) 40 MG tablet Take 1 tablet (40 mg total) by mouth daily. 06/18/13  Yes Mihai Croitoru, MD  PARoxetine (PAXIL) 20 MG tablet Take 1 tablet (20 mg total) by mouth every morning. 06/20/13  Yes Chipper Herb, MD  rosuvastatin (CRESTOR) 40 MG tablet Take 20 mg by mouth every evening.  06/18/12  Yes Chipper Herb, MD  torsemide (DEMADEX) 20 MG tablet Take 20 mg by mouth daily as needed (edema).   Yes Historical Provider, MD    Review of Systems  She has dyspnea on exertion and midsternal chest pressure with atypical symptoms as well as melena as outlined above. She denies chest pain, LE edema, orthopnea, PND, nausea, vomiting, hematuria.  All other systems reviewed and are otherwise negative except as noted above.  Physical Exam  Blood pressure 180/87, pulse 56, height 5\' 4"  (1.626 m), weight 180 lb (81.647 kg).  Blood pressure recheck 162/82 General: Pleasant, NAD Psych: Normal affect. Neuro: Alert and oriented X 3. Moves all extremities spontaneously. HEENT: Normal  Neck: Supple without bruits or JVD. Lungs:  Resp regular and unlabored, CTA. Heart: RRR no s3, s4, or murmurs. Abdomen: Soft, non-tender, non-distended, BS + x 4.  Extremities: No  clubbing, cyanosis or edema. DP/PT/Radials 2+ and equal bilaterally.  Accessory Clinical Findings  ECG - sinus brady with 1st degree AV block (PR 256), rate 56, LAD, IVCD QRS 132, poor R wave progression.  Assessment & Plan  1.  Hypertension Patient reports compliance with current medical therapy yet BP's have been trending up, especially in the setting of anxiety related to device beeping.  Increase Lisinopril to 20mg  bid at this time. BMET today. Bradycardia limits ability to uptitrate beta blocker.  2.  Combined ischemic/non-ischemic cardiomyopathy/CAD Last ischemic evaluation 2013 Pt with dyspnea on exertion x 2 months and left upper chest heaviness with atypical features x a few days.   Will check echocardiogram and myoview to assess lv fxn and r/o ischemia. Cont asa, statin, bb.  Consider d/c of asa given ongoing anticoagulation w/ eliquis.  3.  Paroxysmal atrial fibrillation Anticoagulated with Eliquis and on bb therapy. CHADS2VASC is at least 5. Pt reports dark stools w/o brbpr or orthostasis since initiation of Eliquis 09/2013.  Last colonoscopy/EGD 2012 normal. CBC today Patient reports ventricular rates 120-130's when in atrial fibrillation in September.  She does have occasional palps, but nothing sustained and no significant tachycardia noted on device checks since then.  Sinus bradycardia limits uptitration of beta blocker.   4.  ICD - [employer] per interrogation today She has follow up scheduled with Dr Recardo Evangelist 01/2014 and would like to wait until then for device generator change.  Check echo and myoview as above. Pt with relative bradycardia and atrial fibrillation with ventricular rates 120-130's, may need to consider atrial lead at generator change - she currently has 3 RV endocardial leads.    5.  Hyperlipidemia Lipoprofile 08/2013 - TC 147, HDL 55, LDL 65; LFT's normal 08/2013. Maintained on Crestor.  Plan to follow up with Dr Recardo Evangelist in January  2016 as scheduled to discuss ICD generator change.  Pt to monitor home blood pressures and call if SBP consistently >140.    Murray Hodgkins, NP 12/08/2013, 4:31 PM

## 2013-12-08 NOTE — Telephone Encounter (Signed)
patient called back to report her bp is 220/106. The patient will see chris berge np at the [address] location at 1:30p today. They will also check her device while she is there. The patient was advised to go ahead and take her medicines. Patient voiced understanding of appt time and location. She will take all her meds to appt.

## 2013-12-15 ENCOUNTER — Other Ambulatory Visit: Payer: Self-pay | Admitting: Cardiovascular Disease

## 2013-12-15 NOTE — Telephone Encounter (Signed)
Rx was sent to pharmacy electronically. 

## 2013-12-17 ENCOUNTER — Encounter: Payer: Self-pay | Admitting: Cardiovascular Disease

## 2013-12-18 DIAGNOSIS — H3532 Exudative age-related macular degeneration: Secondary | ICD-10-CM | POA: Diagnosis not present

## 2013-12-18 DIAGNOSIS — H35051 Retinal neovascularization, unspecified, right eye: Secondary | ICD-10-CM | POA: Diagnosis not present

## 2013-12-25 ENCOUNTER — Encounter (HOSPITAL_COMMUNITY): Payer: Self-pay | Admitting: Cardiovascular Disease

## 2013-12-25 ENCOUNTER — Telehealth (HOSPITAL_COMMUNITY): Payer: Self-pay

## 2013-12-25 NOTE — Telephone Encounter (Signed)
Encounter complete. 

## 2013-12-29 ENCOUNTER — Encounter: Payer: Self-pay | Admitting: Cardiovascular Disease

## 2013-12-30 ENCOUNTER — Ambulatory Visit (HOSPITAL_BASED_OUTPATIENT_CLINIC_OR_DEPARTMENT_OTHER)
Admission: RE | Admit: 2013-12-30 | Discharge: 2013-12-30 | Disposition: A | Discharge status: Home / Self Care | Payer: Medicare Other | Source: Ambulatory Visit | Attending: Nurse Practitioner | Admitting: Nurse Practitioner

## 2013-12-30 ENCOUNTER — Inpatient Hospital Stay (HOSPITAL_COMMUNITY)
Admission: AD | Admit: 2013-12-30 | Discharge: 2013-12-31 | DRG: 287 | Disposition: A | Discharge status: Home / Self Care | Payer: Medicare Other | Source: Ambulatory Visit | Attending: Cardiology | Admitting: Cardiology

## 2013-12-30 ENCOUNTER — Other Ambulatory Visit: Payer: Self-pay

## 2013-12-30 ENCOUNTER — Inpatient Hospital Stay: Admit: 2013-12-30 | Payer: Self-pay | Admitting: Cardiovascular Disease

## 2013-12-30 ENCOUNTER — Encounter (HOSPITAL_COMMUNITY): Payer: Self-pay | Admitting: Nurse Practitioner

## 2013-12-30 DIAGNOSIS — I369 Nonrheumatic tricuspid valve disorder, unspecified: Secondary | ICD-10-CM | POA: Diagnosis not present

## 2013-12-30 DIAGNOSIS — Z4502 Encounter for adjustment and management of automatic implantable cardiac defibrillator: Secondary | ICD-10-CM

## 2013-12-30 DIAGNOSIS — I429 Cardiomyopathy, unspecified: Secondary | ICD-10-CM | POA: Diagnosis not present

## 2013-12-30 DIAGNOSIS — Z95810 Presence of automatic (implantable) cardiac defibrillator: Secondary | ICD-10-CM | POA: Diagnosis not present

## 2013-12-30 DIAGNOSIS — R0609 Other forms of dyspnea: Secondary | ICD-10-CM

## 2013-12-30 DIAGNOSIS — Z4509 Encounter for adjustment and management of other cardiac device: Secondary | ICD-10-CM | POA: Insufficient documentation

## 2013-12-30 DIAGNOSIS — E663 Overweight: Secondary | ICD-10-CM | POA: Insufficient documentation

## 2013-12-30 DIAGNOSIS — I200 Unstable angina: Secondary | ICD-10-CM | POA: Diagnosis present

## 2013-12-30 DIAGNOSIS — R42 Dizziness and giddiness: Secondary | ICD-10-CM | POA: Insufficient documentation

## 2013-12-30 DIAGNOSIS — I10 Essential (primary) hypertension: Secondary | ICD-10-CM

## 2013-12-30 DIAGNOSIS — E1169 Type 2 diabetes mellitus with other specified complication: Secondary | ICD-10-CM | POA: Diagnosis present

## 2013-12-30 DIAGNOSIS — N183 Chronic kidney disease, stage 3 unspecified: Secondary | ICD-10-CM | POA: Diagnosis present

## 2013-12-30 DIAGNOSIS — K219 Gastro-esophageal reflux disease without esophagitis: Secondary | ICD-10-CM | POA: Diagnosis present

## 2013-12-30 DIAGNOSIS — Z91041 Radiographic dye allergy status: Secondary | ICD-10-CM | POA: Diagnosis not present

## 2013-12-30 DIAGNOSIS — R079 Chest pain, unspecified: Secondary | ICD-10-CM

## 2013-12-30 DIAGNOSIS — E7800 Pure hypercholesterolemia, unspecified: Secondary | ICD-10-CM

## 2013-12-30 DIAGNOSIS — Z86718 Personal history of other venous thrombosis and embolism: Secondary | ICD-10-CM

## 2013-12-30 DIAGNOSIS — H3530 Unspecified macular degeneration: Secondary | ICD-10-CM | POA: Diagnosis present

## 2013-12-30 DIAGNOSIS — I480 Paroxysmal atrial fibrillation: Secondary | ICD-10-CM | POA: Diagnosis not present

## 2013-12-30 DIAGNOSIS — I739 Peripheral vascular disease, unspecified: Secondary | ICD-10-CM | POA: Diagnosis present

## 2013-12-30 DIAGNOSIS — R9439 Abnormal result of other cardiovascular function study: Secondary | ICD-10-CM | POA: Diagnosis present

## 2013-12-30 DIAGNOSIS — I25110 Atherosclerotic heart disease of native coronary artery with unstable angina pectoris: Secondary | ICD-10-CM | POA: Diagnosis not present

## 2013-12-30 DIAGNOSIS — I129 Hypertensive chronic kidney disease with stage 1 through stage 4 chronic kidney disease, or unspecified chronic kidney disease: Secondary | ICD-10-CM | POA: Diagnosis present

## 2013-12-30 DIAGNOSIS — I951 Orthostatic hypotension: Secondary | ICD-10-CM | POA: Insufficient documentation

## 2013-12-30 DIAGNOSIS — M1990 Unspecified osteoarthritis, unspecified site: Secondary | ICD-10-CM | POA: Diagnosis present

## 2013-12-30 DIAGNOSIS — R0600 Dyspnea, unspecified: Secondary | ICD-10-CM | POA: Insufficient documentation

## 2013-12-30 DIAGNOSIS — Z8249 Family history of ischemic heart disease and other diseases of the circulatory system: Secondary | ICD-10-CM | POA: Insufficient documentation

## 2013-12-30 DIAGNOSIS — R5383 Other fatigue: Secondary | ICD-10-CM | POA: Insufficient documentation

## 2013-12-30 DIAGNOSIS — E785 Hyperlipidemia, unspecified: Secondary | ICD-10-CM | POA: Diagnosis present

## 2013-12-30 DIAGNOSIS — I252 Old myocardial infarction: Secondary | ICD-10-CM

## 2013-12-30 DIAGNOSIS — I82409 Acute embolism and thrombosis of unspecified deep veins of unspecified lower extremity: Secondary | ICD-10-CM | POA: Diagnosis present

## 2013-12-30 DIAGNOSIS — I2510 Atherosclerotic heart disease of native coronary artery without angina pectoris: Secondary | ICD-10-CM

## 2013-12-30 DIAGNOSIS — Z91040 Latex allergy status: Secondary | ICD-10-CM | POA: Diagnosis not present

## 2013-12-30 DIAGNOSIS — R002 Palpitations: Secondary | ICD-10-CM | POA: Insufficient documentation

## 2013-12-30 DIAGNOSIS — Z6830 Body mass index (BMI) 30.0-30.9, adult: Secondary | ICD-10-CM | POA: Insufficient documentation

## 2013-12-30 DIAGNOSIS — Z888 Allergy status to other drugs, medicaments and biological substances status: Secondary | ICD-10-CM

## 2013-12-30 DIAGNOSIS — R931 Abnormal findings on diagnostic imaging of heart and coronary circulation: Secondary | ICD-10-CM | POA: Diagnosis not present

## 2013-12-30 HISTORY — DX: Atherosclerotic heart disease of native coronary artery without angina pectoris: I25.10

## 2013-12-30 HISTORY — DX: Presence of automatic (implantable) cardiac defibrillator: Z95.810

## 2013-12-30 HISTORY — PX: CARDIOVASCULAR STRESS TEST: SHX262

## 2013-12-30 HISTORY — DX: Unstable angina: I20.0

## 2013-12-30 LAB — COMPREHENSIVE METABOLIC PANEL
ALT: 20 U/L (ref 0–35)
AST: 20 U/L (ref 0–37)
Albumin: 3.9 g/dL (ref 3.5–5.2)
Alkaline Phosphatase: 81 U/L (ref 39–117)
Anion gap: 12 (ref 5–15)
BUN: 20 mg/dL (ref 6–23)
CO2: 24 mEq/L (ref 19–32)
Calcium: 9.8 mg/dL (ref 8.4–10.5)
Chloride: 105 mEq/L (ref 96–112)
Creatinine, Ser: 1.21 mg/dL — ABNORMAL HIGH (ref 0.50–1.10)
GFR calc non Af Amer: 41 mL/min — ABNORMAL LOW (ref >90)
GFR, EST AFRICAN AMERICAN: 48 mL/min — AB (ref >90)
Glucose, Bld: 89 mg/dL (ref 70–99)
Potassium: 5.1 mEq/L (ref 3.7–5.3)
SODIUM: 141 meq/L (ref 137–147)
TOTAL PROTEIN: 7.5 g/dL (ref 6.0–8.3)
Total Bilirubin: 0.8 mg/dL (ref 0.3–1.2)

## 2013-12-30 LAB — CBC WITH DIFFERENTIAL/PLATELET
BASOS ABS: 0 10*3/uL (ref 0.0–0.1)
BASOS PCT: 1 % (ref 0–1)
EOS ABS: 0.4 10*3/uL (ref 0.0–0.7)
Eosinophils Relative: 6 % — ABNORMAL HIGH (ref 0–5)
HCT: 40.8 % (ref 36.0–46.0)
Hemoglobin: 13.4 g/dL (ref 12.0–15.0)
Lymphocytes Relative: 34 % (ref 12–46)
Lymphs Abs: 2 10*3/uL (ref 0.7–4.0)
MCH: 31.5 pg (ref 26.0–34.0)
MCHC: 32.8 g/dL (ref 30.0–36.0)
MCV: 95.8 fL (ref 78.0–100.0)
Monocytes Absolute: 0.4 10*3/uL (ref 0.1–1.0)
Monocytes Relative: 7 % (ref 3–12)
NEUTROS ABS: 3.1 10*3/uL (ref 1.7–7.7)
NEUTROS PCT: 52 % (ref 43–77)
Platelets: 220 10*3/uL (ref 150–400)
RBC: 4.26 MIL/uL (ref 3.87–5.11)
RDW: 13.6 % (ref 11.5–15.5)
WBC: 5.9 10*3/uL (ref 4.0–10.5)

## 2013-12-30 LAB — TROPONIN I

## 2013-12-30 LAB — PROTIME-INR
INR: 1.25 (ref 0.00–1.49)
Prothrombin Time: 15.9 seconds — ABNORMAL HIGH (ref 11.6–15.2)

## 2013-12-30 MED ORDER — PAROXETINE HCL 20 MG PO TABS
20.0000 mg | ORAL_TABLET | Freq: Every day | ORAL | Status: DC
  Administered 2013-12-31: 20 mg via ORAL
  Filled 2013-12-30: qty 1

## 2013-12-30 MED ORDER — ASPIRIN EC 81 MG PO TBEC: 81.0000 mg | DELAYED_RELEASE_TABLET | Freq: Every day | ORAL | Status: DC

## 2013-12-30 MED ORDER — ONDANSETRON HCL 4 MG/2ML IJ SOLN: 4.0000 mg | Freq: Four times a day (QID) | INTRAMUSCULAR | Status: DC | PRN

## 2013-12-30 MED ORDER — ASPIRIN EC 81 MG PO TBEC
81.0000 mg | DELAYED_RELEASE_TABLET | Freq: Every day | ORAL | Status: DC
  Administered 2013-12-30: 81 mg via ORAL
  Filled 2013-12-30: qty 1

## 2013-12-30 MED ORDER — TECHNETIUM TC 99M SESTAMIBI GENERIC - CARDIOLITE
30.0000 | Freq: Once | INTRAVENOUS | Status: AC | PRN
  Administered 2013-12-30: 30 via INTRAVENOUS

## 2013-12-30 MED ORDER — REGADENOSON 0.4 MG/5ML IV SOLN
0.4000 mg | Freq: Once | INTRAVENOUS | Status: AC
  Administered 2013-12-30: 0.4 mg via INTRAVENOUS

## 2013-12-30 MED ORDER — SODIUM CHLORIDE 0.9 % IV SOLN
1.0000 mL/kg/h | INTRAVENOUS | Status: DC
  Administered 2013-12-31: 1 mL/kg/h via INTRAVENOUS

## 2013-12-30 MED ORDER — ROSUVASTATIN CALCIUM 10 MG PO TABS
20.0000 mg | ORAL_TABLET | Freq: Every day | ORAL | Status: DC
  Administered 2013-12-30: 20 mg via ORAL
  Filled 2013-12-30: qty 2

## 2013-12-30 MED ORDER — MORPHINE SULFATE 2 MG/ML IJ SOLN: 2.0000 mg | INTRAMUSCULAR | Status: DC | PRN

## 2013-12-30 MED ORDER — SODIUM CHLORIDE 0.9 % IJ SOLN
3.0000 mL | Freq: Two times a day (BID) | INTRAMUSCULAR | Status: DC
Start: 2013-12-30 — End: 2013-12-31

## 2013-12-30 MED ORDER — SODIUM CHLORIDE 0.9 % IV SOLN: 250.0000 mL | INTRAVENOUS | Status: DC | PRN

## 2013-12-30 MED ORDER — NITROGLYCERIN 0.4 MG SL SUBL
0.4000 mg | SUBLINGUAL_TABLET | SUBLINGUAL | Status: DC | PRN
  Administered 2013-12-30: 0.4 mg via SUBLINGUAL

## 2013-12-30 MED ORDER — NITROGLYCERIN 0.4 MG SL SUBL
SUBLINGUAL_TABLET | SUBLINGUAL | Status: AC
  Filled 2013-12-30: qty 1

## 2013-12-30 MED ORDER — NITROGLYCERIN 2 % TD OINT
0.5000 [in_us] | TOPICAL_OINTMENT | Freq: Four times a day (QID) | TRANSDERMAL | Status: DC
  Administered 2013-12-30 – 2013-12-31 (×2): 0.5 [in_us] via TOPICAL
  Filled 2013-12-30: qty 30

## 2013-12-30 MED ORDER — DIPHENHYDRAMINE HCL 50 MG/ML IJ SOLN
25.0000 mg | INTRAMUSCULAR | Status: AC
  Administered 2013-12-31: 25 mg via INTRAVENOUS
  Filled 2013-12-30: qty 1

## 2013-12-30 MED ORDER — ADULT MULTIVITAMIN W/MINERALS CH
1.0000 | ORAL_TABLET | Freq: Every day | ORAL | Status: DC
  Administered 2013-12-31: 1 via ORAL
  Filled 2013-12-30: qty 1

## 2013-12-30 MED ORDER — ASPIRIN 81 MG PO CHEW
81.0000 mg | CHEWABLE_TABLET | ORAL | Status: AC
  Administered 2013-12-31: 81 mg via ORAL
  Filled 2013-12-30: qty 1

## 2013-12-30 MED ORDER — SODIUM CHLORIDE 0.9 % IJ SOLN: 3.0000 mL | INTRAMUSCULAR | Status: DC | PRN

## 2013-12-30 MED ORDER — FAMOTIDINE 20 MG PO TABS
20.0000 mg | ORAL_TABLET | ORAL | Status: AC
  Administered 2013-12-30: 20 mg via ORAL
  Filled 2013-12-30: qty 1

## 2013-12-30 MED ORDER — NITROGLYCERIN 0.4 MG SL SUBL: 0.4000 mg | SUBLINGUAL_TABLET | SUBLINGUAL | Status: DC | PRN

## 2013-12-30 MED ORDER — DISOPYRAMIDE PHOSPHATE 100 MG PO CAPS
200.0000 mg | ORAL_CAPSULE | Freq: Two times a day (BID) | ORAL | Status: DC
  Administered 2013-12-30 – 2013-12-31 (×2): 200 mg via ORAL
  Filled 2013-12-30 (×3): qty 2

## 2013-12-30 MED ORDER — PREDNISONE 20 MG PO TABS
60.0000 mg | ORAL_TABLET | ORAL | Status: AC
  Administered 2013-12-31: 60 mg via ORAL
  Filled 2013-12-30: qty 3

## 2013-12-30 MED ORDER — CARBOXYMETHYLCELLULOSE SODIUM 0.5 % OP SOLN: 1.0000 [drp] | Freq: Three times a day (TID) | OPHTHALMIC | Status: DC | PRN

## 2013-12-30 MED ORDER — PANTOPRAZOLE SODIUM 40 MG PO TBEC
40.0000 mg | DELAYED_RELEASE_TABLET | Freq: Every day | ORAL | Status: DC
  Administered 2013-12-31: 40 mg via ORAL
  Filled 2013-12-30: qty 1

## 2013-12-30 MED ORDER — AMINOPHYLLINE 25 MG/ML IV SOLN
150.0000 mg | Freq: Once | INTRAVENOUS | Status: AC
  Administered 2013-12-30: 150 mg via INTRAVENOUS

## 2013-12-30 MED ORDER — TECHNETIUM TC 99M SESTAMIBI GENERIC - CARDIOLITE
10.0000 | Freq: Once | INTRAVENOUS | Status: AC | PRN
  Administered 2013-12-30: 10 via INTRAVENOUS

## 2013-12-30 MED ORDER — PREDNISONE 20 MG PO TABS
60.0000 mg | ORAL_TABLET | ORAL | Status: AC
  Administered 2013-12-30: 60 mg via ORAL
  Filled 2013-12-30: qty 3

## 2013-12-30 MED ORDER — METOPROLOL TARTRATE 25 MG PO TABS
25.0000 mg | ORAL_TABLET | Freq: Two times a day (BID) | ORAL | Status: DC
  Administered 2013-12-30 – 2013-12-31 (×2): 25 mg via ORAL
  Filled 2013-12-30 (×2): qty 1

## 2013-12-30 MED ORDER — LISINOPRIL 20 MG PO TABS
20.0000 mg | ORAL_TABLET | Freq: Two times a day (BID) | ORAL | Status: DC
  Administered 2013-12-30: 20 mg via ORAL
  Filled 2013-12-30: qty 1

## 2013-12-30 MED ORDER — POLYVINYL ALCOHOL 1.4 % OP SOLN
1.0000 [drp] | Freq: Three times a day (TID) | OPHTHALMIC | Status: DC | PRN
  Filled 2013-12-30: qty 15

## 2013-12-30 MED ORDER — ALUM & MAG HYDROXIDE-SIMETH 200-200-20 MG/5ML PO SUSP
30.0000 mL | ORAL | Status: DC | PRN
  Administered 2013-12-30: 30 mL via ORAL
  Filled 2013-12-30: qty 30

## 2013-12-30 MED ORDER — ACETAMINOPHEN 325 MG PO TABS
650.0000 mg | ORAL_TABLET | ORAL | Status: DC | PRN
  Administered 2013-12-30 – 2013-12-31 (×2): 650 mg via ORAL
  Filled 2013-12-30 (×2): qty 2

## 2013-12-30 MED ORDER — ROSUVASTATIN CALCIUM 10 MG PO TABS: 20.0000 mg | ORAL_TABLET | Freq: Every evening | ORAL | Status: DC

## 2013-12-30 NOTE — Progress Notes (Signed)
2D Echo Performed 12/30/2013    Kiasha Bellin, RCS  

## 2013-12-30 NOTE — Progress Notes (Signed)
Patient complained of 6 out of 10 chest tightness. 4L O2 [place] applied EKG obtained. Patient's BP=157/66 1 sublingual nitro given at 1420 At 1425 patient's BP=113/72 and tightness was 2 out of 10. Refused second nitro. Patient then stated she felt better.

## 2013-12-30 NOTE — Procedures (Addendum)
[name] [address] [name] [id number]  Cardiology Nuclear Med Study  [name] is a 80 y.o. female     MRN : [id number]     DOB: January 08, 1932  Procedure Date: 12/30/2013  Nuclear Med Background Indication for Stress Test:  Evaluation for Ischemia and Stent Patency History:  CAD;MI;STENT/PTCA-2003;AICD-1993-VF/ARREST;PAF;Last NUC MPI on 04/25/2012-scar/septal;EF=66% Cardiac Risk Factors: Family History - CAD, Hypertension, Lipids, Overweight, PVD and DVT;Orthostatic hypotension  Symptoms:  Chest Pain, Dizziness, DOE, Fatigue, Light-Headedness and Palpitations   Nuclear Pre-Procedure Caffeine/Decaff Intake:  8:00pm NPO After: 6:00am   IV Site: R Forearm  IV 0.9% NS with Angio Cath:  22g  Chest Size (in):  n/a IV Started by: Rolene Course, RN  Height: 5\' 4"  (1.626 m)  Cup Size: C  BMI:  Body mass index is 30.88 kg/(m^2). Weight:  180 lb (81.647 kg)   Tech Comments:  Chest Pain persisted 9/10, 2 ntg sl were administered    Nuclear Med Study 1 or 2 day study: 1 day  Stress Test Type:  [place] Provider:  Jorja Loa, NP   Resting Radionuclide: Technetium 27m Sestamibi  Resting Radionuclide Dose: 10.5 mCi   Stress Radionuclide:  Technetium 87m Sestamibi  Stress Radionuclide Dose: 31.6 mCi           Stress Protocol Rest HR: 54 Stress HR: 57  Rest BP: 145/83 Stress BP: 153/81  Exercise Time (min): n/a METS: n/a   Predicted Max HR: 140 bpm % Max HR: 50.71 bpm Rate Pressure Product: 11573  Dose of Adenosine (mg):  n/a Dose of Lexiscan: 0.4 mg  Dose of Atropine (mg): n/a Dose of Dobutamine: n/a mcg/kg/min (at max HR)  Stress Test Technologist: Leane Para, CCT Nuclear Technologist: Otho Perl, CNMT   Rest Procedure:  Myocardial perfusion imaging was performed at rest 45 minutes following the intravenous administration of Technetium 86m Sestamibi. Stress Procedure:   The patient received IV Lexiscan 0.4mg  over 15 seconds. Technetium 65m Sestamibi injected IV at 30 seconds. The patient experienced Chest tightness and 150 mg Aminophylline IV was administered. There were no significant changes with Lexiscan. Quantitative spect images were obtained after a 45 minute delay.  Transient Ischemic Dilatation (Normal <1.22):  0.91 QGS EDV:  61 ml QGS ESV:  24 ml LV Ejection Fraction: 60%     Rest ECG: NSR, IVCD  Stress ECG: No significant change from baseline ECG  QPS Raw Data Images:  Normal; no motion artifact; normal heart/lung ratio. Stress Images:  There is mildly decreased uptake in the entire inferior wall and inferior septum and severely decreased uptake in the apex. Rest Images:  Comparison with the stress images reveals no significant change in the inferior wall and inferior septum, but there is improvement in the apical defect. Subtraction (SDS):  There is a fixed defect that is most consistent with a possible previous infarction in the inferior wall and ischemia in the apex.. LV Wall Motion:  NL LV Function; NL Wall Motion  Impression Exercise Capacity:  Lexiscan with no exercise. BP Response:  Normal blood pressure response. Clinical Symptoms:  Significant chest pain. ECG Impression:  No significant ECG changes with Lexiscan. Comparison with Prior Nuclear Study: Compared to 2014 the apical ischemia is new, but the inferoseptal abnormality is similar   Overall Impression:  Intermediate risk stress nuclear study with apical ischemia and inferior/inferoseptal fixed defect that may be artifactual.  Due to ongoing chest discomfort that  was not relieved by 3 SL NTG and aminophylline, the patient was referred for coronary angiography today.   [name], MD  12/30/2013 1:20 PM

## 2013-12-30 NOTE — H&P (Signed)
Patient ID: [name] MRN: [id number], DOB/AGE: 1931/04/24   Admit date: 12/30/2013  Primary Physician: Redge Gainer, MD Primary Cardiologist: Jerilynn Mages. Tonya Wantz, MD  Pt. Profile:  80 y/o female with a h/o CAD who presents for direct admission 2/2 chest pain developed during a stress test this AM.  Problem List  Past Medical History  Diagnosis Date  . ICD (implantable cardiac defibrillator) in place     a. s/p initial ICD in 1993 in setting of cardiac arrest;  b. 01/2007 gen change: Guidant T135 Vitality DS VR single lead ICD.  Marland Kitchen Hypertension   . GERD (gastroesophageal reflux disease)     omeprazole  . Diverticulosis of colon (without mention of hemorrhage) 2007    Colonoscopy   . CAD (coronary artery disease)     a. history of cardiac arrest 1993;  b. s/p LAD/LCX stenting in 2003;  c. 07/2011 Cath: LM nl, LAD patent stent, D1 80ost, LCX patent stent, RCA min irregs;  d. 04/2012 MV: EF 66%, no ishcemia;  e. 12/2013 Echo: Ef 55%, no rwma, Gr 1 DD, triv AI/MR, mildly dil LA;  f. 12/2013 Lexi MV: intermediate risk - apical ischemia and inf/infsept fixed defect, ? artifactual.  . H/O hiatal hernia   . Orthostatic hypotension   . Chronic sinus bradycardia   . CKD (chronic kidney disease), stage III   . History of DVT in the past, not on Coumadin now     left  leg  . HYPERCHOLESTEROLEMIA   . Nephrolithiasis, just saw Dr [name]   . EXTERNAL HEMORRHOIDS   . ARTHRITIS   . Esophagitis, unspecified     a. 2012 EGD  . Unspecified gastritis and gastroduodenitis without mention of hemorrhage     a. 2003 EGD->not noted on 2012 EGD.  . Macular degeneration     gets injecton in eye every 5 weeks- last injection - 05/03/2013   . Arthritis   . Peripheral vascular disease     ???  . Hiatal hernia     a. 2012 EGD.  Marland Kitchen Esophageal stricture     a. 2012 s/p dil.    Past Surgical History  Procedure Laterality Date  . Cardiac defibrillator placement  02/05/2007    Guidant  .  Hemorroidectomy  80's  . Hysteroscopy w/d&c  09/02/2010    Procedure: DILATATION AND CURETTAGE (D&C) /HYSTEROSCOPY;  Surgeon: Margarette Asal;  Location: [facility];  Service: Gynecology;  Laterality: N/A;  Dilation and Curettage with Hysteroscopy and Polypectomy  . Cardiac catheterization    . Coronary stents     . Cystoscopy with ureteroscopy Right 05/20/2013    Procedure: CYSTOSCOPY, RIGHT URETEROSCOPY STONE EXTRACTION, Insertion of right DOUBLE J STENT ;  Surgeon: Irine Seal, MD;  Location: [facility];  Service: Urology;  Laterality: Right;  . Holmium laser application Right [id number]    Procedure: HOLMIUM LASER APPLICATION;  Surgeon: Irine Seal, MD;  Location: [facility];  Service: Urology;  Laterality: Right;  . Cystoscopy with ureteroscopy and stent placement N/A 06/03/2013    Procedure: SECOND LOOK CYSTOSCOPY WITH URETEROSCOPY  HOLMIUM LASER LITHO AND STONE EXTRACTION Sammie Bench ;  Surgeon: Malka So, MD;  Location: [facility];  Service: Urology;  Laterality: N/A;  . Left heart catheterization with coronary angiogram N/A 07/28/2011    Procedure: LEFT HEART CATHETERIZATION WITH CORONARY ANGIOGRAM;  Surgeon: Lorretta Harp, MD;  Location: [facility];  Service: Cardiovascular;  Laterality: N/A;    Allergies  Allergies  Allergen  Reactions  . Actos [Pioglitazone] Swelling  . Contrast Media [Iodinated Diagnostic Agents] Other (See Comments)    Headache (no action/pre-med required)  . Latex Rash  . Lipitor [Atorvastatin] Other (See Comments)    myalgia  . Protonix [Pantoprazole Sodium] Diarrhea   HPI  [name] is a 80 y.o. female with the above problem list. She has a cardiac history dating back to 1993 at which time she had a VF arrest and was found to have a non-ischemic cardiomyopathy. She was transferred to [facility] where she underwent ICD implantation. She has been followed by Dr Rollene Fare and subsequently Dr Sallyanne Kuster. She was found to have CAD and underwent PCI to LAD and circumflex in  July 2003. Subsequent catheterizations in 2006 and 2013 demonstrated stent patency.   She was admitted 09/2013 in [place] with atrial fibrillation, pyelonephritis and sepsis. She spontaneously converted to sinus rhythm and was subsequently discharged. She reports an ICD shock 2 days later for which she did not seek medical attention. She was last seen in the office by Dr Sallyanne Kuster 10/15/2013 at which time Eliquis was added for atrial fibrillation. Her device was interrogated and found to be functioning normally without recent ICD discharges. She was approaching ERI but had 6 months longevity remaining.   She was last seen in clinic on 11/23 secondary to her device beeping - indicating ERI.  She also reported intermittent chest heaviness @ rest and more DOE than she was accustomed to.  As a result she was set up for an echo and myoview, both of which were performed this AM.  The echo showed nl LV fxn.  With lexiscan, she developed chest pain that persisted despite ntg and aminophylline.  Her MV was read out as intermediate risk and she was advised to present to the hospital for admission today.  She is currently chest pain free.  She is on eliquis @ home and did take it this AM.  Home Medications  Prior to Admission medications   Medication Sig Start Date End Date Taking? Authorizing Provider  acetaminophen (TYLENOL) 325 MG tablet Take 650 mg by mouth every 6 (six) hours as needed for mild pain or moderate pain.      Yes Historical Provider, MD  apixaban (ELIQUIS) 2.5 MG TABS tablet Take 1 tablet (2.5 mg total) by mouth 2 (two) times daily. 10/15/13  Yes Keonta Monceaux, MD  aspirin EC 81 MG tablet Take 81 mg by mouth daily.   Yes Historical Provider, MD  Calcium Citrate-Vitamin D (CITRACAL + D PO) Take 1 tablet by mouth daily.   Yes Historical Provider, MD  carboxymethylcellulose (REFRESH PLUS) 0.5 % SOLN Place 1 drop into both eyes 3 (three) times daily as needed (dry eyes).    Yes Historical  Provider, MD  Cholecalciferol (VITAMIN D) 1000 UNITS capsule Take 2,000 Units by mouth daily.    Yes Historical Provider, MD  disopyramide (NORPACE) 100 MG capsule TAKE (2) CAPSULES TWICE DAILY. 10/14/13  Yes Chipper Herb, MD  lisinopril (PRINIVIL,ZESTRIL) 20 MG tablet Take 1 tablet (20 mg total) by mouth 2 (two) times daily. Patient takes doses 12 hours apart at 1000am and 2200pm 12/08/13  Yes Rogelia Mire, NP  metoprolol tartrate (LOPRESSOR) 25 MG tablet Take 1 tablet (25 mg total) by mouth 2 (two) times daily. Patient takes doses 12 hours apart at 1000am and 2200pm 11/03/13  Yes Caitriona Sundquist, MD  Multiple Vitamin (MULTIVITAMIN WITH MINERALS) TABS tablet Take 1 tablet by mouth daily.  Yes Historical Provider, MD  pantoprazole (PROTONIX) 40 MG tablet Take 1 tablet (40 mg total) by mouth daily. 12/15/13  Yes Kashawna Manzer, MD  PARoxetine (PAXIL) 20 MG tablet Take 1 tablet (20 mg total) by mouth every morning. 06/20/13  Yes Chipper Herb, MD  rosuvastatin (CRESTOR) 40 MG tablet Take 20 mg by mouth every evening.  06/18/12  Yes Chipper Herb, MD  torsemide (DEMADEX) 20 MG tablet Take 20 mg by mouth daily as needed (edema).   Yes Historical Provider, MD    Family History  Family History  Problem Relation Age of Onset  . Stroke Mother   . Other Mother     brain tumor  . Other Father     MI    Social History  History   Social History  . Marital Status: Widowed    Spouse Name: N/A    Number of Children: N/A  . Years of Education: N/A   Occupational History  . Not on file.   Social History Main Topics  . Smoking status: Never Smoker   . Smokeless tobacco: Never Used  . Alcohol Use: No  . Drug Use: No  . Sexual Activity: No   Other Topics Concern  . Not on file   Social History Narrative     Review of Systems General:  No chills, fever, night sweats or weight changes.  Cardiovascular:  +++ intermittent chest pain and dyspnea on exertion as above.  No edema,  orthopnea, palpitations, paroxysmal nocturnal dyspnea. Dermatological: No rash, lesions/masses Respiratory: No cough, +++ dyspnea Urologic: No hematuria, dysuria Abdominal:   No nausea, vomiting, diarrhea, bright red blood per rectum, melena, or hematemesis Neurologic:  No visual changes, wkns, changes in mental status. All other systems reviewed and are otherwise negative except as noted above.  Physical Exam  Blood pressure 151/79, pulse 60, temperature 98.3 F (36.8 C), temperature source Oral, resp. rate 18, height 5\' 4"  (1.626 m), weight 180 lb (81.647 kg), SpO2 100 %.  General: Pleasant, NAD Psych: Normal affect. Neuro: Alert and oriented X 3. Moves all extremities spontaneously. HEENT: Normal  Neck: Supple without bruits or JVD. Lungs:  Resp regular and unlabored, CTA. Heart: RRR no s3, s4, or murmurs. Abdomen: Soft, non-tender, non-distended, BS + x 4.  Extremities: No clubbing, cyanosis or edema. DP/PT/Radials 2+ and equal bilaterally.  Labs  pending   Radiology/Studies  No results found.  ECG  pending  ASSESSMENT AND PLAN  1.  Unstable angina/CAD: Patient underwent stress testing this morning which was read out as being intermediate risk with evidence of apical ischemia. Echo this morning showed normal LV function. She developed significant chest pain with lexiscan that persisted despite nitroglycerin and Aminophylline.   Admission was arranged at that point. She is currently chest pain-free. She will require diagnostic catheterization however she is on eliquis chronically and took it this morning.  We will hold off on eliquis and touch base with the interventional team to see if catheterization can be performed tomorrow afternoon. Continue beta blocker, ACE inhibitor, aspirin, and statin. Cycle cardiac markers given prolonged chest pain.  She does have a dye allergy and I will premedicate appropriately.  2.  HTN:  BP up currently. Cont home meds and follow.  3.  HL:   Cont statin.  LDL 65 with nl LFT's in 08/2013.  4.  PAF:  In sinus on tele.  Cont bb.  Hold eliquis, which she took this AM.  Add heparin if rules in.  5.  H/O VF arrest s/p AICD:  @ ERI w/ plan for f/u with Dr. Loletha Grayer in January.  6.  H/O Dark stools:  CBC was stable on last check on 11/23.  Pt says she has not noted any blood in stools.  No h/o orthostasis.   Signed, Murray Hodgkins, NP 12/30/2013, 1:38 PM  I have seen and examined the patient in clinic during her stress test and again this afternoon with Murray Hodgkins, NP.  I have reviewed the chart, notes and new data.  I agree with PA's note.  Key new complaints: now angina free Key examination changes: no clinical HF Key new findings / data: Labs pending  PLAN: Coronary angio tomorrow.  Sanda Klein, MD, [place] [phone] 12/30/2013, 4:10 PM

## 2013-12-31 ENCOUNTER — Encounter (HOSPITAL_COMMUNITY)
Admission: AD | Disposition: A | Discharge status: Home / Self Care | Payer: Self-pay | Source: Ambulatory Visit | Attending: Cardiology

## 2013-12-31 ENCOUNTER — Encounter (HOSPITAL_COMMUNITY): Payer: Self-pay | Admitting: Cardiology

## 2013-12-31 DIAGNOSIS — R079 Chest pain, unspecified: Secondary | ICD-10-CM | POA: Diagnosis present

## 2013-12-31 DIAGNOSIS — K219 Gastro-esophageal reflux disease without esophagitis: Secondary | ICD-10-CM | POA: Diagnosis present

## 2013-12-31 DIAGNOSIS — E785 Hyperlipidemia, unspecified: Secondary | ICD-10-CM | POA: Diagnosis present

## 2013-12-31 DIAGNOSIS — I739 Peripheral vascular disease, unspecified: Secondary | ICD-10-CM | POA: Diagnosis present

## 2013-12-31 DIAGNOSIS — Z888 Allergy status to other drugs, medicaments and biological substances status: Secondary | ICD-10-CM | POA: Diagnosis not present

## 2013-12-31 DIAGNOSIS — Z91040 Latex allergy status: Secondary | ICD-10-CM | POA: Diagnosis not present

## 2013-12-31 DIAGNOSIS — I25110 Atherosclerotic heart disease of native coronary artery with unstable angina pectoris: Secondary | ICD-10-CM | POA: Diagnosis not present

## 2013-12-31 DIAGNOSIS — I429 Cardiomyopathy, unspecified: Secondary | ICD-10-CM | POA: Diagnosis not present

## 2013-12-31 DIAGNOSIS — I200 Unstable angina: Secondary | ICD-10-CM

## 2013-12-31 DIAGNOSIS — Z86718 Personal history of other venous thrombosis and embolism: Secondary | ICD-10-CM | POA: Diagnosis not present

## 2013-12-31 DIAGNOSIS — N183 Chronic kidney disease, stage 3 (moderate): Secondary | ICD-10-CM | POA: Diagnosis not present

## 2013-12-31 DIAGNOSIS — I129 Hypertensive chronic kidney disease with stage 1 through stage 4 chronic kidney disease, or unspecified chronic kidney disease: Secondary | ICD-10-CM | POA: Diagnosis present

## 2013-12-31 DIAGNOSIS — I2510 Atherosclerotic heart disease of native coronary artery without angina pectoris: Secondary | ICD-10-CM

## 2013-12-31 DIAGNOSIS — R9439 Abnormal result of other cardiovascular function study: Secondary | ICD-10-CM | POA: Diagnosis present

## 2013-12-31 DIAGNOSIS — Z91041 Radiographic dye allergy status: Secondary | ICD-10-CM | POA: Diagnosis not present

## 2013-12-31 DIAGNOSIS — H3530 Unspecified macular degeneration: Secondary | ICD-10-CM | POA: Diagnosis present

## 2013-12-31 DIAGNOSIS — Z95810 Presence of automatic (implantable) cardiac defibrillator: Secondary | ICD-10-CM | POA: Diagnosis not present

## 2013-12-31 DIAGNOSIS — I480 Paroxysmal atrial fibrillation: Secondary | ICD-10-CM | POA: Diagnosis not present

## 2013-12-31 DIAGNOSIS — M1990 Unspecified osteoarthritis, unspecified site: Secondary | ICD-10-CM | POA: Diagnosis present

## 2013-12-31 HISTORY — DX: Atherosclerotic heart disease of native coronary artery without angina pectoris: I25.10

## 2013-12-31 HISTORY — PX: LEFT HEART CATHETERIZATION WITH CORONARY ANGIOGRAM: SHX5451

## 2013-12-31 LAB — TROPONIN I

## 2013-12-31 SURGERY — LEFT HEART CATHETERIZATION WITH CORONARY ANGIOGRAM
Anesthesia: LOCAL

## 2013-12-31 MED ORDER — FENTANYL CITRATE 0.05 MG/ML IJ SOLN
INTRAMUSCULAR | Status: AC
  Filled 2013-12-31: qty 2

## 2013-12-31 MED ORDER — APIXABAN 2.5 MG PO TABS: 2.5000 mg | ORAL_TABLET | Freq: Two times a day (BID) | ORAL | Status: DC

## 2013-12-31 MED ORDER — LIDOCAINE HCL (PF) 1 % IJ SOLN
INTRAMUSCULAR | Status: AC
  Filled 2013-12-31: qty 30

## 2013-12-31 MED ORDER — NITROGLYCERIN 0.4 MG SL SUBL: 0.4000 mg | SUBLINGUAL_TABLET | SUBLINGUAL | Status: DC | PRN

## 2013-12-31 MED ORDER — SODIUM CHLORIDE 0.9 % IV SOLN
INTRAVENOUS | Status: DC
  Administered 2013-12-31: 13:00:00 via INTRAVENOUS

## 2013-12-31 MED ORDER — MIDAZOLAM HCL 2 MG/2ML IJ SOLN
INTRAMUSCULAR | Status: AC
  Filled 2013-12-31: qty 2

## 2013-12-31 MED ORDER — VERAPAMIL HCL 2.5 MG/ML IV SOLN
INTRAVENOUS | Status: AC
  Filled 2013-12-31: qty 2

## 2013-12-31 MED ORDER — HEPARIN SODIUM (PORCINE) 1000 UNIT/ML IJ SOLN
INTRAMUSCULAR | Status: AC
  Filled 2013-12-31: qty 1

## 2013-12-31 MED ORDER — PANTOPRAZOLE SODIUM 40 MG PO TBEC: 40.0000 mg | DELAYED_RELEASE_TABLET | Freq: Two times a day (BID) | ORAL | Status: DC

## 2013-12-31 MED ORDER — HEPARIN (PORCINE) IN NACL 2-0.9 UNIT/ML-% IJ SOLN
INTRAMUSCULAR | Status: AC
  Filled 2013-12-31: qty 1000

## 2013-12-31 NOTE — Interval H&P Note (Signed)
Cath Lab Visit (complete for each Cath Lab visit)  Clinical Evaluation Leading to the Procedure:   ACS: Yes.    Non-ACS:    Anginal Classification: CCS IV  Anti-ischemic medical therapy: Minimal Therapy (1 class of medications)  Non-Invasive Test Results: Intermediate-risk stress test findings: cardiac mortality 1-3%/year  Prior CABG: No previous CABG      History and Physical Interval Note:  12/31/2013 11:30 AM  [name]  has presented today for surgery, with the diagnosis of abnormal nuclear stress test, ongoing cp  The various methods of treatment have been discussed with the patient and family. After consideration of risks, benefits and other options for treatment, the patient has consented to  Procedure(s): LEFT HEART CATHETERIZATION WITH CORONARY ANGIOGRAM (N/A) as a surgical intervention .  The patient's history has been reviewed, patient examined, no change in status, stable for surgery.  I have reviewed the patient's chart and labs.  Questions were answered to the patient's satisfaction.     Kathlyn Sacramento

## 2013-12-31 NOTE — Discharge Summary (Signed)
Physician Discharge Summary       Patient ID: [name] MRN: [id number] DOB/AGE: 06-18-1931 80 y.o.  Admit date: 12/30/2013 Discharge date: 12/31/2013 Primary Cardiologist: Dr. Gwenlyn Found ICD:  Dr. Jerilynn Mages. Croitoru    Discharge Diagnoses:  Principal Problem:   Unstable angina, neg MI, cath stable.maybe GI Active Problems:   Abnormal nuclear stress test, 12/30/13   Hyperlipidemia   Automatic implantable cardioverter-defibrillator in situ   Chronic renal insufficiency, stage III (moderate), Scr 1.5 in the past   History of DVT in the past, on eliquis now   Paroxysmal atrial fibrillation, chads2 Vasc2 score of 5, on eliquis   CAD in native artery   Discharged Condition: good  Procedures: 12/31/13 cardiac cath without complications by Dr. Jonathon Jordan Course: 80 y.o. female with the above problem list. She has a cardiac history dating back to 1993 at which time she had a VF arrest and was found to have a non-ischemic cardiomyopathy. She was transferred to [facility] where she underwent ICD implantation. She has been followed by Dr Rollene Fare and subsequently Dr Sallyanne Kuster. She was found to have CAD and underwent PCI to LAD and circumflex in July 2003. Subsequent catheterizations in 2006 and 2013 demonstrated stent patency.   She was admitted 09/2013 in [place] with atrial fibrillation, pyelonephritis and sepsis. She spontaneously converted to sinus rhythm and was subsequently discharged. She reports an ICD shock 2 days later for which she did not seek medical attention. She was last seen in the office by Dr Sallyanne Kuster 10/15/2013 at which time Eliquis was added for atrial fibrillation. Her device was interrogated and found to be functioning normally without recent ICD discharges. She was approaching ERI but had 6 months longevity remaining.   She was last seen in clinic on 11/23 secondary to her device beeping - indicating ERI. She also reported intermittent chest heaviness @ rest  and more DOE than she was accustomed to. As a result she was set up for an echo and myoview, both of which were performed this AM. The echo showed nl LV fxn. With lexiscan, she developed chest pain that persisted despite ntg and aminophylline. Her MV was read out as intermediate risk and she was advised to present to the hospital for admission today. She was chest pain free in the ER. She is on eliquis @ home and did take it the day of admit.  She was admitted and eliquis held, troponins were negative and she underwent cardiac cath.  The cath reflected stable coronary arteries.  I increased her protonix for now.  She will follow up with Dr. Jerilynn Mages. Croitoru at scheduled appt.  She will resume eliquis in AM.  She was stable post cath and found stable for discharge.     Consults: None  Significant Diagnostic Studies:  BMET    Component Value Date/Time   NA 141 12/30/2013 1540   NA 141 09/09/2013 0841   K 5.1 12/30/2013 1540   CL 105 12/30/2013 1540   CO2 24 12/30/2013 1540   GLUCOSE 89 12/30/2013 1540   GLUCOSE 94 09/09/2013 0841   BUN 20 12/30/2013 1540   BUN 25 09/09/2013 0841   CREATININE 1.21* 12/30/2013 1540   CREATININE 1.52* 07/31/2012 1019   CALCIUM 9.8 12/30/2013 1540   GFRNONAA 41* 12/30/2013 1540   GFRNONAA 33* 07/31/2012 1019   GFRAA 48* 12/30/2013 1540   GFRAA 38* 07/31/2012 1019     CBC    Component Value Date/Time   WBC 5.9 12/30/2013 1540  WBC 5.9 09/09/2013 0926   RBC 4.26 12/30/2013 1540   RBC 4.2 09/09/2013 0926   HGB 13.4 12/30/2013 1540   HGB 12.8 09/09/2013 0926   HCT 40.8 12/30/2013 1540   HCT 39.7 09/09/2013 0926   PLT 220 12/30/2013 1540   MCV 95.8 12/30/2013 1540   MCV 95.0 09/09/2013 0926   MCH 31.5 12/30/2013 1540   MCH 30.7 09/09/2013 0926   MCHC 32.8 12/30/2013 1540   MCHC 32.3 09/09/2013 0926   RDW 13.6 12/30/2013 1540   LYMPHSABS 2.0 12/30/2013 1540   MONOABS 0.4 12/30/2013 1540   EOSABS 0.4 12/30/2013 1540   BASOSABS 0.0 12/30/2013  1540    troponin  <0.30 X 3  Cardiac cath:   Left Main: Normal  Left Anterior Descending (LAD): Normal in size with patent stent proximally with 10% in-stent restenosis. There is 20% mid stenosis. The rest of the vessel is free of significant disease.  1st diagonal (D1): Normal in size with 90% ostial stenosis which is unchanged from before.  2nd diagonal (D2): Very small in size.  3rd diagonal (D3): Very small in size.  Circumflex (LCx): Normal in size and nondominant. The vessel has minor irregularities.  1st obtuse marginal: Very small in size.  2nd obtuse marginal: Small in size with no significant disease.  3rd obtuse marginal: Normal in size with no significant disease.  Ramus coronary artery: This is a large vessel with a stent noted proximally. The stent has 20% in-stent restenosis.   Right Coronary Artery: Normal in size and nondominant. The vessel could not be selectively engaged. It appears to be free of significant disease in the proximal and midsegment. It was not well-visualized distally.  Left ventriculography:Was not performed.  Final Conclusions:  1. Patent LAD and ramus stent with no evidence of obstructive disease. There is severe disease in the ostial first diagonal which is unchanged from most recent cardiac catheterization. The right coronary artery could not be engaged selectively but nonselective angiography showed no significant disease in the proximal and midsegment.  2. Normal LV and diastolic pressure.  Recommendations:  Recommend avoiding cardiac catheterization via the right radial artery in the future due to severe tortuosity in the innominate artery. Continue medical therapy for coronary artery disease. Can resume anticoagulation with Eliquis tomorrow.   Discharge Exam: Blood pressure 130/86, pulse 67, temperature 98.2 F (36.8 C), temperature source Oral, resp. rate 14, height 5\' 4"  (1.626 m), weight 175 lb 4.8 oz (79.516 kg),  SpO2 93 %.    Disposition: 01-Home or Self Care     Medication List    TAKE these medications        acetaminophen 325 MG tablet  Commonly known as:  TYLENOL  Take 650 mg by mouth every 6 (six) hours as needed for mild pain or moderate pain.     apixaban 2.5 MG Tabs tablet  Commonly known as:  ELIQUIS  Take 1 tablet (2.5 mg total) by mouth 2 (two) times daily.  Start taking on:  01/01/2014     aspirin EC 81 MG tablet  Take 81 mg by mouth daily.     carboxymethylcellulose 0.5 % Soln  Commonly known as:  REFRESH PLUS  Place 1 drop into both eyes 3 (three) times daily as needed (dry eyes).     CITRACAL + D PO  Take 1 tablet by mouth daily.     CRESTOR 40 MG tablet  Generic drug:  rosuvastatin  Take 20 mg by mouth every evening.  disopyramide 100 MG capsule  Commonly known as:  NORPACE  TAKE (2) CAPSULES TWICE DAILY.     lisinopril 20 MG tablet  Commonly known as:  PRINIVIL,ZESTRIL  Take 1 tablet (20 mg total) by mouth 2 (two) times daily. Patient takes doses 12 hours apart at 1000am and 2200pm     metoprolol tartrate 25 MG tablet  Commonly known as:  LOPRESSOR  Take 1 tablet (25 mg total) by mouth 2 (two) times daily. Patient takes doses 12 hours apart at 1000am and 2200pm     multivitamin with minerals Tabs tablet  Take 1 tablet by mouth daily.     pantoprazole 40 MG tablet  Commonly known as:  PROTONIX  Take 1 tablet (40 mg total) by mouth 2 (two) times daily.     PARoxetine 20 MG tablet  Commonly known as:  PAXIL  Take 1 tablet (20 mg total) by mouth every morning.     torsemide 20 MG tablet  Commonly known as:  DEMADEX  Take 20 mg by mouth daily as needed (edema).     Vitamin D 1000 UNITS capsule  Take 2,000 Units by mouth daily.       Follow-up Information    Follow up with Sanda Klein, MD On 01/20/2014.   Specialty:  Cardiology   Why:  keep appt with Dr. Olevia Perches in Jan.   Minette Brine information:   [address] [place]  [id number] [phone]        Discharge Instructions: Call [place] at [phone] if any bleeding, swelling or drainage at cath site.  May shower, no tub baths for 48 hours for groin sticks. No lifting over 5 pounds for 3 days.  No Driving for 3 days.    We increased your protonix to twice a day to see if this will prevent pain.    Heart healthy diet decrease acidic foods.  Call if pain continues and we could add long acting NTG, though your cath today had not changed from previous.   Signed: Isaiah Serge Nurse Practitioner-Certified [facility]: [facility] 12/31/2013, 1:30 PM  Time spent on discharge : >30 minutes.

## 2013-12-31 NOTE — CV Procedure (Signed)
   Cardiac Catheterization Procedure Note  Name: [name] MRN: [id number] DOB: Jan 01, 1932  Procedure: Left Heart Cath, Selective Coronary Angiography.  Indication: Known history of coronary artery disease with previous stenting. Chest pain with abnormal nuclear stress test.  Medications:  Sedation:  1 mg IV Versed, 25 mcg IV Fentanyl  Contrast:  90 mL Omnipaque   Procedural Details: The right wrist was prepped, draped, and anesthetized with 1% lidocaine. Using the modified Seldinger technique, a 5 French sheath was introduced into the right radial artery. 3 mg of verapamil was administered through the sheath, weight-based unfractionated heparin was administered intravenously. A Jackie catheter was used for selective coronary angiography. There was severe tortuosity of the left innominate artery which made catheter engagement extremely difficult. I could not engage the right coronary artery selectively in spite of using multiple catheters and a guide. Thus, I performed nonselective angiography with a pigtail catheter. The distal vessel could not be well-visualized but the proximal and midsegment were seen. Catheter exchanges were performed over an exchange length guidewire. There were no immediate procedural complications. A TR band was used for radial hemostasis at the completion of the procedure.  The patient was transferred to the post catheterization recovery area for further monitoring.  Procedural Findings:  Hemodynamics: AO:  130/70   mmHg LV:  132 over 5    mmHg LVEDP: 9  mmHg  Coronary angiography: Coronary dominance: Right   Left Main:  Normal  Left Anterior Descending (LAD):  Normal in size with patent stent proximally with 10% in-stent restenosis. There is 20% mid stenosis. The rest of the vessel is free of significant disease.  1st diagonal (D1):  Normal in size with 90% ostial stenosis which is unchanged from before.  2nd diagonal (D2):  Very small in size.  3rd  diagonal (D3):  Very small in size.  Circumflex (LCx):  Normal in size and nondominant. The vessel has minor irregularities.  1st obtuse marginal:  Very small in size.  2nd obtuse marginal:  Small in size with no significant disease.  3rd obtuse marginal:  Normal in size with no significant disease.   Ramus coronary artery: This is a large vessel with a stent noted proximally. The stent has 20% in-stent restenosis.   Right Coronary Artery: Normal in size and nondominant. The vessel could not be selectively engaged. It appears to be free of significant disease in the proximal and midsegment. It was not well-visualized distally.    Left ventriculography:Was not performed.   Final Conclusions:   1. Patent LAD and ramus stent with no evidence of obstructive disease. There is severe disease in the ostial first diagonal which is unchanged from most recent cardiac catheterization. The right coronary artery could not be engaged selectively but nonselective angiography showed no significant disease in the proximal and midsegment.  2. Normal LV and diastolic pressure.   Recommendations:  Recommend avoiding cardiac catheterization via the right radial artery in the future due to severe tortuosity in the innominate artery. Continue medical therapy for coronary artery disease. Can resume anticoagulation with Eliquis tomorrow.   Kathlyn Sacramento MD, [facility] 12/31/2013, 12:16 PM

## 2013-12-31 NOTE — Progress Notes (Signed)
Subjective: No further complaints  Objective: Vital signs in last 24 hours: Temp:  [98.2 F (36.8 C)-98.3 F (36.8 C)] 98.2 F (36.8 C) (12/16 0535) Pulse Rate:  [60-72] 72 (12/16 0535) Resp:  [18] 18 (12/16 0535) BP: (132-151)/(73-83) 132/73 mmHg (12/16 0535) SpO2:  [98 %-100 %] 99 % (12/16 0535) Weight:  [175 lb 4.8 oz (79.516 kg)-180 lb (81.647 kg)] 175 lb 4.8 oz (79.516 kg) (12/16 0535) Weight change:  Last BM Date: 12/29/13 Intake/Output from previous day: +21.8 12/15 0701 - 12/16 0700 In: 21.8 [I.V.:21.8] Out: -  Intake/Output this shift:    PE: General:Pleasant affect, NAD Skin:Warm and dry, brisk capillary refill HEENT:normocephalic, sclera clear, mucus membranes moist Heart:S1S2 RRR without murmur, gallup, rub or click Lungs:clear without rales, rhonchi, or wheezes [id number], non tender, + BS, do not palpate liver spleen or masses Ext:no lower ext edema, 2+ pedal pulses, 2+ radial pulses Neuro:alert and oriented, MAE, follows commands, + facial symmetry  EKG SR 1st degree AV block PR 273 ms, LBBB   Lab Results:  Recent Labs  12/30/13 1540  WBC 5.9  HGB 13.4  HCT 40.8  PLT 220   BMET  Recent Labs  12/30/13 1540  NA 141  K 5.1  CL 105  CO2 24  GLUCOSE 89  BUN 20  CREATININE 1.21*  CALCIUM 9.8    Recent Labs  12/30/13 2038 12/31/13 0308  TROPONINI <0.30 <0.30    Lab Results  Component Value Date   CHOL 147 09/09/2013   HDL 55 09/09/2013   LDLCALC 65 09/09/2013   TRIG 134 09/09/2013   CHOLHDL 2.8 09/02/2010   Lab Results  Component Value Date   HGBA1C 6.0 05/07/2013     Lab Results  Component Value Date   TSH 3.039 07/25/2011    Hepatic Function Panel  Recent Labs  12/30/13 1540  PROT 7.5  ALBUMIN 3.9  AST 20  ALT 20  ALKPHOS 81  BILITOT 0.8   No results for input(s): CHOL in the last 72 hours. No results for input(s): PROTIME in the last 72 hours.     Studies/Results: No results  found.  Medications: I have reviewed the patient's current medications. Scheduled Meds: . [START ON 01/01/2014] aspirin EC  81 mg Oral Daily  . diphenhydrAMINE  25 mg Intravenous Pre-Cath  . disopyramide  200 mg Oral BID  . lisinopril  20 mg Oral BID  . metoprolol tartrate  25 mg Oral BID  . multivitamin with minerals  1 tablet Oral Daily  . nitroGLYCERIN  0.5 inch Topical 4 times per day  . pantoprazole  40 mg Oral Daily  . PARoxetine  20 mg Oral Daily  . predniSONE  60 mg Oral Pre-Cath  . rosuvastatin  20 mg Oral QHS  . sodium chloride  3 mL Intravenous Q12H  . sodium chloride  3 mL Intravenous Q12H   Continuous Infusions: . sodium chloride 1 mL/kg/hr (12/31/13 0544)   PRN Meds:.sodium chloride, sodium chloride, acetaminophen, alum & mag hydroxide-simeth, morphine injection, nitroGLYCERIN, nitroGLYCERIN, ondansetron (ZOFRAN) IV, polyvinyl alcohol, sodium chloride, sodium chloride  Assessment/Plan:  80 y/o female with a h/o CAD, ICD, HTN,  who presents for direct admission 2/2 chest pain developed during a stress test 12/30/13 with results intermediate risk with apical ischemia and inf/inf.septal fixed defect that may be artifactual. .  She was admitted and plan for cath today.  Principal Problem:   Unstable angina, neg MI, for cardiac cath  today  Active Problems:   Abnormal nuclear stress test, 12/30/13- for cath   Hyperlipidemia   Automatic implantable cardioverter-defibrillator in situ   Chronic renal insufficiency, stage III (moderate), Scr 1.5 in the past cr 1.2   History of DVT in the past, not on Coumadin now     LOS: 1 day   Time spent with pt. :15 minutes. [facility] R  Nurse Practitioner Certified Pager [phone] or after 5pm and on weekends call [phone] 12/31/2013, 8:48 AM   I actually saw the patient after her cardiac catheterization today that was done for abnormal Myoview. Report no significant coronary disease noted. She feels fine now following her  cardiac catheterization. She is likely stable for discharge following bed rest.  Follow-up with Dr. Sallyanne Kuster.  Leonie Man, M.D., M.S. Interventional Cardiologist   Pager # [phone]

## 2013-12-31 NOTE — Discharge Instructions (Signed)
Call [facility] at [phone] if any bleeding, swelling or drainage at cath site.  May shower, no tub baths for 48 hours for groin sticks. No lifting over 5 pounds for 3 days.  No Driving for 3 days.    We increased your protonix to twice a day to see if this will prevent pain.    Heart healthy diet decrease acidic foods.  Call if pain continues and we could add long acting NTG, though your cath today had not changed from previous.    Cardiac Diet A cardiac diet can help stop heart disease or a stroke from happening. It involves eating less unhealthy fats and eating more healthy fats.  FOODS TO AVOID OR LIMIT  Limit saturated fats. This type of fat is found in oils and dairy products, such as:  Coconut oil.  Palm oil.  Cocoa butter.  Butter.  Avoid trans-fat or hydrogenated oils. These are found in fried or pre-made baked goods, such as:  Margarine.  Pre-made cookies, cakes, and crackers.  Limit processed meats (hot dogs, deli meats, sausage) to 3 ounces a week.  Limit high-fat meats (marbled meats, fried chicken, or chicken with skin) to 3 ounces a week.  Limit salt (sodium) to 1500 milligrams a day.   Limit sweets and drinks with added sugar to no more than 5 servings a week. One serving is:  1 tablespoon of sugar.  1 tablespoon of jelly or jam.   cup sorbet.  1 cup lemonade.   cup regular soda. EAT MORE OF THE FOLLOWING FOODS Fruit  Eat 4to 5 servings a day. One serving of fruit is:  1 medium whole fruit.   cup dried fruit.   cup of fresh, frozen, or canned fruit.   cup 100% fruit juice. Vegetables  Eat 4 to 5 servings a day. One serving is:  1 cup raw leafy vegetables.   cup raw or cooked, cut-up vegetables.   cup vegetable juice. Whole Grains  Eat 3 servings a day (1 ounce equals 1 serving). Legumes (such as beans, peas, and lentils)   Eat at least 4 servings a week ( cup equals 1 serving). Nuts and Seeds    Eat at least 4 servings a week ( cup equals 1 serving). Dietary Fiber  Eat 20 to 30 grams a day. Some foods high in dietary fiber include:  Dried beans.  Citrus fruits.  Apples, bananas.  Broccoli, Brussels sprouts, and eggplant.  Oats. Omega-3 Fats  Eat food with omega-3 fats. You can also take a dietary pill (supplement) that has 1 gram of DHA and EPA. Have 3.5 ounces of fatty fish a week, such as:  Salmon.  Mackerel.  [place] tuna.  Sardines.  Lake trout.  Herring. PREPARING YOUR FOOD  Broil, bake, steam, or roast foods. Do not fry food. Do not cook food in butter (fat).  Use non-stick cooking sprays.  Remove skin from poultry, such as chicken and [place].  Remove fat from meat.  Take the fat off the top of stews, soups, and gravy.  Use lemon or herbs to flavor food instead of using butter or margarine.  Use nonfat yogurt, salsa, or low-fat dressings for salads. Document Released: 07/04/2011 Document Reviewed: 07/04/2011 [facility] Patient Information 2015 [place]. This information is not intended to replace advice given to you by your health care provider. Make sure you discuss any questions you have with your health care provider.

## 2013-12-31 NOTE — Progress Notes (Signed)
UR completed 

## 2014-01-20 ENCOUNTER — Encounter: Payer: Self-pay | Admitting: Cardiovascular Disease

## 2014-01-20 ENCOUNTER — Ambulatory Visit (INDEPENDENT_AMBULATORY_CARE_PROVIDER_SITE_OTHER): Payer: Medicare Other | Admitting: Cardiovascular Disease

## 2014-01-20 VITALS — BP 116/68 | HR 60 | Resp 20 | Ht 64.0 in | Wt 174.5 lb

## 2014-01-20 DIAGNOSIS — Z79899 Other long term (current) drug therapy: Secondary | ICD-10-CM | POA: Diagnosis not present

## 2014-01-20 DIAGNOSIS — I472 Ventricular tachycardia: Secondary | ICD-10-CM | POA: Diagnosis not present

## 2014-01-20 DIAGNOSIS — I259 Chronic ischemic heart disease, unspecified: Secondary | ICD-10-CM | POA: Diagnosis not present

## 2014-01-20 DIAGNOSIS — Z4502 Encounter for adjustment and management of automatic implantable cardiac defibrillator: Secondary | ICD-10-CM

## 2014-01-20 DIAGNOSIS — I480 Paroxysmal atrial fibrillation: Secondary | ICD-10-CM

## 2014-01-20 DIAGNOSIS — Z95810 Presence of automatic (implantable) cardiac defibrillator: Secondary | ICD-10-CM | POA: Diagnosis not present

## 2014-01-20 DIAGNOSIS — R5383 Other fatigue: Secondary | ICD-10-CM

## 2014-01-20 DIAGNOSIS — Z7901 Long term (current) use of anticoagulants: Secondary | ICD-10-CM | POA: Diagnosis not present

## 2014-01-20 DIAGNOSIS — I4729 Other ventricular tachycardia: Secondary | ICD-10-CM

## 2014-01-20 DIAGNOSIS — I2510 Atherosclerotic heart disease of native coronary artery without angina pectoris: Secondary | ICD-10-CM | POA: Diagnosis not present

## 2014-01-20 DIAGNOSIS — I481 Persistent atrial fibrillation: Secondary | ICD-10-CM | POA: Diagnosis not present

## 2014-01-20 LAB — MDC_IDC_ENUM_SESS_TYPE_INCLINIC
Lead Channel Setting Pacing Amplitude: 2.2 V
Lead Channel Setting Pacing Pulse Width: 0.5 ms
MDC IDC PG SERIAL: 133463
MDC IDC STAT BRADY RV PERCENT PACED: 1 %
Zone Setting Detection Interval: 273 ms
Zone Setting Detection Interval: 363 ms
Zone Setting Detection Interval: 463 ms

## 2014-01-20 NOTE — Progress Notes (Signed)
Patient ID: [name], female   DOB: 07-27-1931, 81 y.o.   MRN: [id number]      Reason for office visit AICD at [facility]  Mrs. Mueth has a history of adrenergically mediated polymorphic VT and received her first implantable defibrillator in 1993. She has been treated with disopyramide for a long time and has not had any recent defibrillator discharges. Her latest device was implanted in 2009 when she also received a new endocardial defibrillator lead. Much later she developed coronary disease and received stents to the ramus intermedius artery and LAD artery. There has been no evidence of disease progression by subsequent cardiac catheterizations in 2006 & 2013 & late 2015.She has normal left ventricular systolic function and no overt heart failure. Most recent assessment of LVEF was in December 2015 when echo, nuclear perfusion study both showed EF of 55-60 percent. She had atrial fibrillation around the time of her initial cardiac arrest in 1993 and again during a hospitalization for acute pyelonephritis related sepsis in September 2015. She has evidence of rate related left bundle branch block during atrial fibrillation with rapid ventricular response. During sinus rhythm she has a nonspecific intraventricular conduction delay with a QRS duration of around 129 ms and a long PR interval. During atrial fibrillation she had frank left bundle branch block with a QRS duration of 157 ms. She is currently receiving anticoagulation with apixaban, so far without bleeding complications. She does not have a history of stroke or TIA.  Her defibrillator reached elective replacement indicator on 12/07/2013. She is here to discuss device replacement. She has had several recent dizzy spells, not associated with any ICD detected arrhythmia. There has been no atrial fibrillation since September she has only 3% ventricular pacing.  She has had recurrent dizziness (thrice since last Friday) and her BP during these  events is lower than usual , but not frankly abnormal (yesterday during a spell 111/60 mm Hg). Dizziness resolves after sitting down. No arrhythmia recorded by her device around the time of the dizzy episodes.   Allergies  Allergen Reactions  . Actos [Pioglitazone] Swelling  . Contrast Media [Iodinated Diagnostic Agents] Other (See Comments)    Headache (no action/pre-med required)  . Latex Rash  . Lipitor [Atorvastatin] Other (See Comments)    myalgia  . Protonix [Pantoprazole Sodium] Diarrhea    Current Outpatient Prescriptions  Medication Sig Dispense Refill  . acetaminophen (TYLENOL) 325 MG tablet Take 650 mg by mouth every 6 (six) hours as needed for mild pain or moderate pain.     Marland Kitchen apixaban (ELIQUIS) 2.5 MG TABS tablet Take 1 tablet (2.5 mg total) by mouth 2 (two) times daily. 60 tablet 5  . aspirin EC 81 MG tablet Take 81 mg by mouth daily.    . Calcium Citrate-Vitamin D (CITRACAL + D PO) Take 1 tablet by mouth daily.    . carboxymethylcellulose (REFRESH PLUS) 0.5 % SOLN Place 1 drop into both eyes 3 (three) times daily as needed (dry eyes).     . Cholecalciferol (VITAMIN D) 1000 UNITS capsule Take 2,000 Units by mouth daily.     . disopyramide (NORPACE) 100 MG capsule TAKE (2) CAPSULES TWICE DAILY. 120 capsule 3  . lisinopril (PRINIVIL,ZESTRIL) 20 MG tablet Take 1 tablet (20 mg total) by mouth 2 (two) times daily. Patient takes doses 12 hours apart at 1000am and 2200pm 60 tablet 6  . metoprolol tartrate (LOPRESSOR) 25 MG tablet Take 1 tablet (25 mg total) by mouth 2 (two) times daily.  Patient takes doses 12 hours apart at 1000am and 2200pm 180 tablet 1  . Multiple Vitamin (MULTIVITAMIN WITH MINERALS) TABS tablet Take 1 tablet by mouth daily.    . nitroGLYCERIN (NITROSTAT) 0.4 MG SL tablet Place 1 tablet (0.4 mg total) under the tongue every 5 (five) minutes as needed for chest pain. 25 tablet 4  . pantoprazole (PROTONIX) 40 MG tablet Take 1 tablet (40 mg total) by mouth 2 (two)  times daily. 60 tablet 1  . PARoxetine (PAXIL) 20 MG tablet Take 1 tablet (20 mg total) by mouth every morning. 90 tablet 0  . rosuvastatin (CRESTOR) 40 MG tablet Take 20 mg by mouth every evening.     . torsemide (DEMADEX) 20 MG tablet Take 20 mg by mouth daily as needed (edema).     No current facility-administered medications for this visit.    Past Medical History  Diagnosis Date  . ICD (implantable cardiac defibrillator) in place     a. s/p initial ICD in 1993 in setting of cardiac arrest;  b. 01/2007 gen change: Guidant T135 Vitality DS VR single lead ICD.  Marland Kitchen Hypertension   . GERD (gastroesophageal reflux disease)     omeprazole  . Diverticulosis of colon (without mention of hemorrhage) 2007    Colonoscopy   . CAD (coronary artery disease)     a. history of cardiac arrest 1993;  b. s/p LAD/LCX stenting in 2003;  c. 07/2011 Cath: LM nl, LAD patent stent, D1 80ost, LCX patent stent, RCA min irregs;  d. 04/2012 MV: EF 66%, no ishcemia;  e. 12/2013 Echo: Ef 55%, no rwma, Gr 1 DD, triv AI/MR, mildly dil LA;  f. 12/2013 Lexi MV: intermediate risk - apical ischemia and inf/infsept fixed defect, ? artifactual.  . H/O hiatal hernia   . Orthostatic hypotension   . Chronic sinus bradycardia   . CKD (chronic kidney disease), stage III   . History of DVT in the past, not on Coumadin now     left  leg  . HYPERCHOLESTEROLEMIA   . Nephrolithiasis, just saw Dr [name]   . EXTERNAL HEMORRHOIDS   . ARTHRITIS   . Esophagitis, unspecified     a. 2012 EGD  . Unspecified gastritis and gastroduodenitis without mention of hemorrhage     a. 2003 EGD->not noted on 2012 EGD.  . Macular degeneration     gets injecton in eye every 5 weeks- last injection - 05/03/2013   . Arthritis   . Peripheral vascular disease     ???  . Hiatal hernia     a. 2012 EGD.  Marland Kitchen Esophageal stricture     a. 2012 s/p dil.  . Myocardial infarction 1993  . AICD (automatic cardioverter/defibrillator) present   . CAD in  native artery 12/31/2013    Previous stents to LAD and Ramus patent on cath today 12/31/13 also There is severe disease in the ostial first diagonal which is unchanged from most recent cardiac catheterization. The right coronary artery could not be engaged selectively but nonselective angiography showed no significant disease in the proximal and midsegment.       Past Surgical History  Procedure Laterality Date  . Cardiac defibrillator placement  02/05/2007    Guidant  . Hemorroidectomy  80's  . Hysteroscopy w/d&c  09/02/2010    Procedure: DILATATION AND CURETTAGE (D&C) /HYSTEROSCOPY;  Surgeon: Margarette Asal;  Location: [facility];  Service: Gynecology;  Laterality: N/A;  Dilation and Curettage with Hysteroscopy and Polypectomy  . Cardiac  catheterization    . Coronary stents     . Cystoscopy with ureteroscopy Right 05/20/2013    Procedure: CYSTOSCOPY, RIGHT URETEROSCOPY STONE EXTRACTION, Insertion of right DOUBLE J STENT ;  Surgeon: Irine Seal, MD;  Location: [facility];  Service: Urology;  Laterality: Right;  . Holmium laser application Right [id number]    Procedure: HOLMIUM LASER APPLICATION;  Surgeon: Irine Seal, MD;  Location: [facility];  Service: Urology;  Laterality: Right;  . Cystoscopy with ureteroscopy and stent placement N/A 06/03/2013    Procedure: SECOND LOOK CYSTOSCOPY WITH URETEROSCOPY  HOLMIUM LASER LITHO AND STONE EXTRACTION Sammie Bench ;  Surgeon: Malka So, MD;  Location: [facility];  Service: Urology;  Laterality: N/A;  . Left heart catheterization with coronary angiogram N/A 07/28/2011    Procedure: LEFT HEART CATHETERIZATION WITH CORONARY ANGIOGRAM;  Surgeon: Lorretta Harp, MD;  Location: [facility] C[facility];  Service: Cardiovascular;  Laterality: N/A;  . Cardiovascular stress test  12/30/2013    abnormal  . Cardiac catheterization  01/01/15    patent stents -there is severe disease in ostial 1st diag but without change.   . Left heart catheterization with coronary angiogram N/A 12/31/2013     Procedure: LEFT HEART CATHETERIZATION WITH CORONARY ANGIOGRAM;  Surgeon: Wellington Hampshire, MD;  Location: [facility];  Service: Cardiovascular;  Laterality: N/A;    Family History  Problem Relation Age of Onset  . Stroke Mother   . Other Mother     brain tumor  . Other Father     MI    History   Social History  . Marital Status: Widowed    Spouse Name: N/A    Number of Children: N/A  . Years of Education: N/A   Occupational History  . Not on file.   Social History Main Topics  . Smoking status: Never Smoker   . Smokeless tobacco: Never Used  . Alcohol Use: No  . Drug Use: No  . Sexual Activity: No   Other Topics Concern  . Not on file   Social History Narrative    Review of systems: Dizziness when standing, improves with sitting, never occurs while laying down. The patient specifically denies any chest pain at rest or with exertion, dyspnea at rest or with exertion, orthopnea, paroxysmal nocturnal dyspnea, syncope, palpitations, focal neurological deficits, intermittent claudication, lower extremity edema, unexplained weight gain, cough, hemoptysis or wheezing.  The patient also denies abdominal pain, nausea, vomiting, dysphagia, constipation, polyuria, polydipsia, dysuria, hematuria, frequency, urgency, abnormal bleeding or bruising, fever, chills, unexpected weight changes, mood swings, change in skin or hair texture, change in voice quality, auditory or visual problems, allergic reactions or rashes, new musculoskeletal complaints other than usual "aches and pains".  PHYSICAL EXAM BP 116/68 mmHg  Pulse 60  Resp 20  Ht 5\' 4"  (1.626 m)  Wt 174 lb 8 oz (79.153 kg)  BMI 29.94 kg/m2 General: Alert, oriented x3, no distress  Head: no evidence of trauma, PERRL, EOMI, no exophtalmos or lid lag, no myxedema, no xanthelasma; normal ears, nose and oropharynx  Neck: normal jugular venous pulsations and no hepatojugular reflux; brisk carotid pulses without delay and no  carotid bruits  Chest: clear to auscultation, no signs of consolidation by percussion or palpation, normal fremitus, symmetrical and full respiratory excursions; and right subclavian scar is, healthy defibrillator site in the right subclavian area  Cardiovascular: normal position and quality of the apical impulse, regular rhythm, normal first and second heart sounds, no murmurs, rubs or  gallops  Abdomen: no tenderness or distention, no masses by palpation, no abnormal pulsatility or arterial bruits, normal bowel sounds, no hepatosplenomegaly  Extremities: no clubbing, cyanosis; trivial ankle edema; bilateral mild stasis dermatitis;varicose veins more obvious on the right than on the left; 2+ radial, ulnar and brachial pulses bilaterally; 2+ right femoral, posterior tibial and dorsalis pedis pulses; 2+ left femoral, posterior tibial and dorsalis pedis pulses; no subclavian or femoral bruits  Neurological: grossly nonfocal   Lipid Panel     Component Value Date/Time   CHOL 147 09/09/2013 0841   TRIG 134 09/09/2013 0841   TRIG 161* 07/31/2012 1019   TRIG 169* 09/02/2010 0629   HDL 55 09/09/2013 0841   HDL 50 09/02/2010 0629   CHOLHDL 2.8 09/02/2010 0629   VLDL 34 09/02/2010 0629   LDLCALC 65 09/09/2013 0841   LDLCALC 65 07/31/2012 1019   LDLCALC 58 09/02/2010 0629    BMET    Component Value Date/Time   NA 141 12/30/2013 1540   NA 141 09/09/2013 0841   K 5.1 12/30/2013 1540   CL 105 12/30/2013 1540   CO2 24 12/30/2013 1540   GLUCOSE 89 12/30/2013 1540   GLUCOSE 94 09/09/2013 0841   BUN 20 12/30/2013 1540   BUN 25 09/09/2013 0841   CREATININE 1.21* 12/30/2013 1540   CREATININE 1.52* 07/31/2012 1019   CALCIUM 9.8 12/30/2013 1540   GFRNONAA 41* 12/30/2013 1540   GFRNONAA 33* 07/31/2012 1019   GFRAA 48* 12/30/2013 1540   GFRAA 38* 07/31/2012 1019     ASSESSMENT AND PLAN  Dizziness Suspect orthostatic hypotension. Make sure she is well hydrated. These spells seem to  correlate with use of immediate release disopyramide, since Norpace CR has not been available recently.   Paroxysmal atrial fibrillation Continue anticoagulation. Likelihood of recurrence is high and CHADSVasc score is 5. Disopyramide should help reduce the prevalence of events. Continue beta blocker as well.  CAD, s/p LAD and Dx stenting in 2003- patent 2006 and 2013 and December 2015. Residual 80% Dx1  No signs or symptoms of coronary insufficiency or heart failure at this time.   Automatic implantable cardioverter-defibrillator at [facility] Excellent lead parameters. The device appropriately discriminated the supraventricular mechanism for her tachycardia during PAF and the ventricular rate did not really approach therapy zones. She does not require pacing for bradycardia. She had a DVT with initial lead implantation. Atrial lead implantation and upgrade to dual chamber device was considered, but risks probably outweigh benefits. Hold Eliquis 48 hours before generator changeout. This procedure has been fully reviewed with the patient and written informed consent has been obtained.    Holli Humbles, MD, [place] [phone] office [phone] pager

## 2014-01-20 NOTE — Patient Instructions (Signed)
Your physician has recommended that you wear an event monitor. Event monitors are medical devices that record the heart's electrical activity. Doctors most often [place] these monitors to diagnose arrhythmias. Arrhythmias are problems with the speed or rhythm of the heartbeat. The monitor is a small, portable device. You can wear one while you do your normal daily activities. This is usually used to diagnose what is causing palpitations/syncope (passing out).  Your physician has recommended that you have a defibrillator battery change out [employer]) 02/10/2014. An implantable cardioverter defibrillator (ICD) is a small device that is placed in your chest or, in rare cases, your abdomen. This device uses electrical pulses or shocks to help control life-threatening, irregular heartbeats that could lead the heart to suddenly stop beating (sudden cardiac arrest). Leads are attached to the ICD that goes into your heart. This is done in the hospital and usually requires an overnight stay. Please see the instruction sheet given to you today for more information.  Your physician recommends that you return for lab work in: 3-7 days prior to the procedure.

## 2014-01-23 ENCOUNTER — Other Ambulatory Visit: Payer: Self-pay | Admitting: *Deleted

## 2014-01-23 DIAGNOSIS — Z4502 Encounter for adjustment and management of automatic implantable cardiac defibrillator: Secondary | ICD-10-CM

## 2014-01-26 ENCOUNTER — Encounter: Payer: Self-pay | Admitting: Family Medicine

## 2014-01-26 ENCOUNTER — Ambulatory Visit (INDEPENDENT_AMBULATORY_CARE_PROVIDER_SITE_OTHER): Payer: Medicare Other | Admitting: Family Medicine

## 2014-01-26 ENCOUNTER — Ambulatory Visit (INDEPENDENT_AMBULATORY_CARE_PROVIDER_SITE_OTHER): Payer: Medicare Other

## 2014-01-26 VITALS — BP 124/76 | HR 51 | Temp 97.1°F | Ht 64.0 in | Wt 174.0 lb

## 2014-01-26 DIAGNOSIS — R42 Dizziness and giddiness: Secondary | ICD-10-CM

## 2014-01-26 DIAGNOSIS — M47814 Spondylosis without myelopathy or radiculopathy, thoracic region: Secondary | ICD-10-CM

## 2014-01-26 DIAGNOSIS — E7800 Pure hypercholesterolemia, unspecified: Secondary | ICD-10-CM

## 2014-01-26 DIAGNOSIS — N390 Urinary tract infection, site not specified: Secondary | ICD-10-CM

## 2014-01-26 DIAGNOSIS — I480 Paroxysmal atrial fibrillation: Secondary | ICD-10-CM | POA: Diagnosis not present

## 2014-01-26 DIAGNOSIS — N200 Calculus of kidney: Secondary | ICD-10-CM | POA: Diagnosis not present

## 2014-01-26 DIAGNOSIS — N183 Chronic kidney disease, stage 3 unspecified: Secondary | ICD-10-CM

## 2014-01-26 DIAGNOSIS — K219 Gastro-esophageal reflux disease without esophagitis: Secondary | ICD-10-CM

## 2014-01-26 DIAGNOSIS — R300 Dysuria: Secondary | ICD-10-CM

## 2014-01-26 DIAGNOSIS — I10 Essential (primary) hypertension: Secondary | ICD-10-CM | POA: Diagnosis not present

## 2014-01-26 DIAGNOSIS — R109 Unspecified abdominal pain: Secondary | ICD-10-CM | POA: Diagnosis not present

## 2014-01-26 DIAGNOSIS — E559 Vitamin D deficiency, unspecified: Secondary | ICD-10-CM

## 2014-01-26 DIAGNOSIS — N189 Chronic kidney disease, unspecified: Secondary | ICD-10-CM

## 2014-01-26 DIAGNOSIS — I2510 Atherosclerotic heart disease of native coronary artery without angina pectoris: Secondary | ICD-10-CM | POA: Diagnosis not present

## 2014-01-26 LAB — POCT URINALYSIS DIPSTICK
BILIRUBIN UA: NEGATIVE
Glucose, UA: NEGATIVE
Ketones, UA: NEGATIVE
Leukocytes, UA: NEGATIVE
Nitrite, UA: NEGATIVE
PROTEIN UA: NEGATIVE
Spec Grav, UA: 1.01
Urobilinogen, UA: NEGATIVE
pH, UA: 5

## 2014-01-26 LAB — POCT UA - MICROSCOPIC ONLY
Bacteria, U Microscopic: NEGATIVE
Casts, Ur, LPF, POC: NEGATIVE
Crystals, Ur, HPF, POC: NEGATIVE
Mucus, UA: NEGATIVE
Yeast, UA: NEGATIVE

## 2014-01-26 MED ORDER — CIPROFLOXACIN HCL 250 MG PO TABS: 250.0000 mg | ORAL_TABLET | Freq: Two times a day (BID) | ORAL | Status: DC

## 2014-01-26 NOTE — Progress Notes (Signed)
Subjective:    Patient ID: [name], female    DOB: [birth year], 81 y.o.   MRN: [id number]  HPI Pt here for follow up and management of chronic medical problems which include hypertension, chronic kidney disease, and atrial fibrillation. The patient continues with her current medication for her multiple medical problems. Cardiac-wise, she is going to have a battery change for her pacemaker on January 26. She has had a recent echo cardiogram and stress test. This is available to review in the record. She is complaining with some right side pain some maxillary pains and some trouble with painful voiding. She does not need any refills on her medication. She will return to the clinic for fasting lab work. Her vital signs today are stable with a blood pressure 124/76. Is important to note in September 2015, the patient ended up in the hospital at [facility], with dehydration atrial fib and urinary tract infection. The patient has a history of degenerative arthritis of the spine and a curvature. The cardiologist is monitoring her currently because of syncopal episodes and dizziness and he will make his decision about whether she gets just a battery change and also an additional lead placement with her pacemaker.         Patient Active Problem List   Diagnosis Date Noted  . ICD (implantable cardioverter-defibrillator) battery depletion 01/20/2014  . Abnormal nuclear stress test, 12/30/13 12/31/2013  . CAD in native artery 12/31/2013  . Unstable angina, neg MI, cath stable.maybe GI 12/30/2013  . Paroxysmal atrial fibrillation, chads2 Vasc2 score of 5, on eliquis 10/16/2013  . Catecholaminergic polymorphic ventricular tachycardia 10/16/2013  . Ureteral stone with hydronephrosis 05/20/2013  . Metabolic syndrome [id number]  . Hypotension, meds stopped this admission 07/29/2011  . Orthostatic hypotension 07/27/2011  . Chest pain, r/o cardiac, negative MI 07/25/2011  . Chronic sinus  bradycardia 07/25/2011  . Automatic implantable cardioverter-defibrillator in situ 07/25/2011  . Chronic renal insufficiency, stage III (moderate), Scr 1.5 in the past 07/25/2011    Class: Chronic  . History of DVT in the past, on eliquis now 07/25/2011    Class: History of  . Nephrolithiasis, just saw Dr [name] 07/25/2011    Class: History of  . DYSPHAGIA 03/24/2010  . Chronic ischemic heart disease 12/03/2007  . RECTAL BLEEDING 12/03/2007  . Diarrhea 12/03/2007  . EPIGASTRIC PAIN 12/03/2007  . Hyperlipidemia 12/02/2007    Class: History of  . EXTERNAL HEMORRHOIDS 12/02/2007    Class: History of  . ESOPHAGITIS 12/02/2007  . GASTRITIS 12/02/2007  . COLITIS 12/02/2007  . DIVERTICULOSIS, COLON 12/02/2007  . ARTHRITIS 12/02/2007    Class: Chronic   Outpatient Encounter Prescriptions as of 01/26/2014  Medication Sig  . acetaminophen (TYLENOL) 325 MG tablet Take 650 mg by mouth every 6 (six) hours as needed for mild pain or moderate pain.   Marland Kitchen apixaban (ELIQUIS) 2.5 MG TABS tablet Take 1 tablet (2.5 mg total) by mouth 2 (two) times daily.  Marland Kitchen aspirin EC 81 MG tablet Take 81 mg by mouth daily.  . Calcium Citrate-Vitamin D (CITRACAL + D PO) Take 1 tablet by mouth daily.  . carboxymethylcellulose (REFRESH PLUS) 0.5 % SOLN Place 1 drop into both eyes 3 (three) times daily as needed (dry eyes).   . Cholecalciferol (VITAMIN D) 1000 UNITS capsule Take 2,000 Units by mouth daily.   . disopyramide (NORPACE) 100 MG capsule TAKE (2) CAPSULES TWICE DAILY.  Marland Kitchen lisinopril (PRINIVIL,ZESTRIL) 20 MG tablet Take 1 tablet (20 mg total) by  mouth 2 (two) times daily. Patient takes doses 12 hours apart at 1000am and 2200pm  . metoprolol tartrate (LOPRESSOR) 25 MG tablet Take 1 tablet (25 mg total) by mouth 2 (two) times daily. Patient takes doses 12 hours apart at 1000am and 2200pm  . Multiple Vitamin (MULTIVITAMIN WITH MINERALS) TABS tablet Take 1 tablet by mouth daily.  . pantoprazole (PROTONIX) 40 MG  tablet Take 1 tablet (40 mg total) by mouth 2 (two) times daily.  Marland Kitchen PARoxetine (PAXIL) 20 MG tablet Take 1 tablet (20 mg total) by mouth every morning.  . rosuvastatin (CRESTOR) 40 MG tablet Take 20 mg by mouth every evening.   . torsemide (DEMADEX) 20 MG tablet Take 20 mg by mouth daily as needed (edema).  . nitroGLYCERIN (NITROSTAT) 0.4 MG SL tablet Place 1 tablet (0.4 mg total) under the tongue every 5 (five) minutes as needed for chest pain. (Patient not taking: Reported on 01/26/2014)    Review of Systems  Constitutional: Negative.   HENT: Negative.   Eyes: Negative.   Respiratory: Negative.   Cardiovascular: Negative.        Dr Sallyanne Kuster- is seeing pt (pacemaker)  Gastrointestinal: Negative.   Endocrine: Negative.   Genitourinary: Negative.   Musculoskeletal: Positive for arthralgias (right side pain and bilateral axillary pain).  Skin: Negative.   Allergic/Immunologic: Negative.   Neurological: Negative.   Hematological: Negative.   Psychiatric/Behavioral: Negative.        Objective:   Physical Exam  Constitutional: She is oriented to person, place, and time. She appears well-developed and well-nourished. No distress.  The patient is alert and cooperative and we reviewed her recent history together.  HENT:  Head: Normocephalic and atraumatic.  Right Ear: External ear normal.  Left Ear: External ear normal.  Nose: Nose normal.  Mouth/Throat: Oropharynx is clear and moist.  Eyes: Conjunctivae and EOM are normal. Pupils are equal, round, and reactive to light. Right eye exhibits no discharge. Left eye exhibits no discharge. No scleral icterus.  Neck: Normal range of motion. Neck supple. No thyromegaly present.  No carotid bruits or anterior cervical adenopathy or thyromegaly  Cardiovascular: Normal rate, regular rhythm, normal heart sounds and intact distal pulses.  Exam reveals no gallop and no friction rub.   No murmur heard. The rhythm was regular at 60/m    Pulmonary/Chest: Effort normal and breath sounds normal. No respiratory distress. She has no wheezes. She has no rales. She exhibits no tenderness.  Abdominal: Soft. Bowel sounds are normal. She exhibits no mass. There is no tenderness. There is no rebound and no guarding.  The abdomen is obese with tenderness in the suprapubic area and the epigastric area. There were no masses.  Musculoskeletal: Normal range of motion. She exhibits no edema or tenderness.  Lymphadenopathy:    She has no cervical adenopathy.  Neurological: She is alert and oriented to person, place, and time. She exhibits normal muscle tone. Coordination normal.  Skin: Skin is warm and dry. No rash noted. No erythema.  Psychiatric: She has a normal mood and affect. Her behavior is normal. Judgment and thought content normal.  Nursing note and vitals reviewed.   BP 124/76 mmHg  Pulse 51  Temp(Src) 97.1 F (36.2 C) (Oral)  Ht 5\' 4"  (1.626 m)  Wt 174 lb (78.926 kg)  BMI 29.85 kg/m2  [facility] reading (PRIMARY) by  Dr. Glendon Axe spine-- degenerative changes and prominent lumbar scoliosis, disc space narrowing  Results for orders placed or performed in visit on 01/26/14  POCT UA - Microscopic Only  Result Value Ref Range   WBC, Ur, HPF, POC 20-30    RBC, urine, microscopic 5-10    Bacteria, U Microscopic NEG    Mucus, UA NEG    Epithelial cells, urine per micros OCC    Crystals, Ur, HPF, POC NEG    Casts, Ur, LPF, POC NEG    Yeast, UA NEG   POCT urinalysis dipstick  Result Value Ref Range   Color, UA YELLOW    Clarity, UA CLEAR    Glucose, UA NEG    Bilirubin, UA NEG    Ketones, UA NEG    Spec Grav, UA 1.010    Blood, UA TRACE    pH, UA 5.0    Protein, UA NEG    Urobilinogen, UA negative    Nitrite, UA NEG    Leukocytes, UA Negative         Assessment & Plan:  1. Hyperlipidemia -Continue current treatment pending lab work results - NMR, lipoprofile; Future  2. Vitamin  D deficiency -Continue current treatment - Vit D  25 hydroxy (rtn osteoporosis monitoring); Future  3. Chronic renal insufficiency, stage III (moderate), Scr 1.5 in the past -We will get a BMP and make sure her kidney function is stable  4. ASCVD (arteriosclerotic cardiovascular disease) -Follow up with cardiology as planned  5. Gastroesophageal reflux disease, esophagitis presence not specified -Avoid highly spiced food -Continue with pantoprazole  6. Essential hypertension -Continue with metoprolol, torsemide, and lisinopril.  7. Paroxysmal atrial fibrillation -Follow up with cardiology as planned and continue with metoprolol as doing   8. Dysuria - POCT UA - Microscopic Only - POCT urinalysis dipstick -cipro 250 twice daily for 10 days -Urine culture is pending  9. Nephrolithiasis -Check urinalysis   10. Dizziness and giddiness -Follow up with cardiology   11. Right sided abdominal pain -Check urine and review thoracic spine films  12. Osteoarthritis thoracic spine -Thoracic spine films   Meds ordered this encounter  Medications  . ciprofloxacin (CIPRO) 250 MG tablet    Sig: Take 1 tablet (250 mg total) by mouth 2 (two) times daily.    Dispense:  20 tablet    Refill:  0     Patient Instructions  The patient should continue to drink plenty of fluids to help avoid future urinary tract infections She should take extra strength Tylenol as needed for pain She needs to return to get fasting blood work We will decide about treating a urinary tract infection once the urine is checked today We will call her when the thoracic spine films are read by the radiologist She should follow through with the visit to the cardiologist She should continue to watch her sodium intake   Arrie Senate MD

## 2014-01-26 NOTE — Patient Instructions (Addendum)
The patient should continue to drink plenty of fluids to help avoid future urinary tract infections She should take extra strength Tylenol as needed for pain She needs to return to get fasting blood work We will decide about treating a urinary tract infection once the urine is checked today We will call her when the thoracic spine films are read by the radiologist She should follow through with the visit to the cardiologist She should continue to watch her sodium intake  Recheck urine in 10-11 days - once finishing the antibiotic

## 2014-01-28 LAB — URINE CULTURE

## 2014-01-29 ENCOUNTER — Telehealth: Payer: Self-pay | Admitting: *Deleted

## 2014-01-29 NOTE — Telephone Encounter (Signed)
-----   Message from Chipper Herb, MD sent at 01/28/2014  1:43 PM EST ----- The bacteria that are growing on the urine culture are not sensitive to Cipro. Discontinue Cipro and start Septra DS No. 14 one twice daily with food for infection until completed. Repeat urinalysis in 7-10 days. Please make sure that the patient is not allergic to sulfa and if there are no drug interactions with her current medication

## 2014-01-29 NOTE — Telephone Encounter (Signed)
Pt notified of results Verbalizes understanding [id number] called into [employer]

## 2014-02-02 ENCOUNTER — Other Ambulatory Visit: Payer: Self-pay | Admitting: Family Medicine

## 2014-02-03 NOTE — Telephone Encounter (Signed)
Refilled per protocol, seen 01/2014

## 2014-02-05 ENCOUNTER — Other Ambulatory Visit (INDEPENDENT_AMBULATORY_CARE_PROVIDER_SITE_OTHER): Payer: Medicare Other

## 2014-02-05 DIAGNOSIS — I2510 Atherosclerotic heart disease of native coronary artery without angina pectoris: Secondary | ICD-10-CM | POA: Diagnosis not present

## 2014-02-05 DIAGNOSIS — R5383 Other fatigue: Secondary | ICD-10-CM | POA: Diagnosis not present

## 2014-02-05 DIAGNOSIS — Z7901 Long term (current) use of anticoagulants: Secondary | ICD-10-CM

## 2014-02-05 DIAGNOSIS — Z79899 Other long term (current) drug therapy: Secondary | ICD-10-CM | POA: Diagnosis not present

## 2014-02-05 DIAGNOSIS — E559 Vitamin D deficiency, unspecified: Secondary | ICD-10-CM

## 2014-02-05 DIAGNOSIS — N390 Urinary tract infection, site not specified: Secondary | ICD-10-CM | POA: Diagnosis not present

## 2014-02-05 DIAGNOSIS — E7800 Pure hypercholesterolemia, unspecified: Secondary | ICD-10-CM

## 2014-02-05 NOTE — Progress Notes (Signed)
Lab only some ordered by Dr. Sallyanne Kuster

## 2014-02-06 LAB — CMP14+EGFR
A/G RATIO: 1.4 (ref 1.1–2.5)
ALT: 23 IU/L (ref 0–32)
AST: 23 IU/L (ref 0–40)
Albumin: 3.7 g/dL (ref 3.5–4.7)
Alkaline Phosphatase: 70 IU/L (ref 39–117)
BILIRUBIN TOTAL: 0.4 mg/dL (ref 0.0–1.2)
BUN / CREAT RATIO: 14 (ref 11–26)
BUN: 22 mg/dL (ref 8–27)
CALCIUM: 9.4 mg/dL (ref 8.7–10.3)
CO2: 22 mmol/L (ref 18–29)
CREATININE: 1.58 mg/dL — AB (ref 0.57–1.00)
Chloride: 102 mmol/L (ref 97–108)
GFR, EST AFRICAN AMERICAN: 35 mL/min/{1.73_m2} — AB (ref >59)
GFR, EST NON AFRICAN AMERICAN: 31 mL/min/{1.73_m2} — AB (ref >59)
Globulin, Total: 2.6 g/dL (ref 1.5–4.5)
Glucose: 92 mg/dL (ref 65–99)
POTASSIUM: 5.4 mmol/L — AB (ref 3.5–5.2)
SODIUM: 139 mmol/L (ref 134–144)
Total Protein: 6.3 g/dL (ref 6.0–8.5)

## 2014-02-06 LAB — CBC WITH DIFFERENTIAL
Basophils Absolute: 0 10*3/uL (ref 0.0–0.2)
Basos: 1 %
Eos: 6 %
Eosinophils Absolute: 0.4 10*3/uL (ref 0.0–0.4)
HEMATOCRIT: 37.9 % (ref 34.0–46.6)
HEMOGLOBIN: 12.8 g/dL (ref 11.1–15.9)
IMMATURE GRANS (ABS): 0 10*3/uL (ref 0.0–0.1)
Immature Granulocytes: 0 %
LYMPHS ABS: 2 10*3/uL (ref 0.7–3.1)
Lymphs: 35 %
MCH: 31.1 pg (ref 26.6–33.0)
MCHC: 33.8 g/dL (ref 31.5–35.7)
MCV: 92 fL (ref 79–97)
MONOCYTES: 10 %
MONOS ABS: 0.6 10*3/uL (ref 0.1–0.9)
NEUTROS PCT: 48 %
Neutrophils Absolute: 2.7 10*3/uL (ref 1.4–7.0)
Platelets: 248 10*3/uL (ref 150–379)
RBC: 4.12 x10E6/uL (ref 3.77–5.28)
RDW: 14.4 % (ref 12.3–15.4)
WBC: 5.7 10*3/uL (ref 3.4–10.8)

## 2014-02-06 LAB — NMR, LIPOPROFILE
CHOLESTEROL: 145 mg/dL (ref 100–199)
HDL CHOLESTEROL BY NMR: 57 mg/dL (ref >39)
HDL PARTICLE NUMBER: 34.5 umol/L (ref >=30.5)
LDL Particle Number: 824 nmol/L (ref <1000)
LDL Size: 20.6 nm (ref >20.5)
LDL-C: 61 mg/dL (ref 0–99)
LP-IR SCORE: 42 (ref <=45)
SMALL LDL PARTICLE NUMBER: 385 nmol/L (ref <=527)
Triglycerides by NMR: 137 mg/dL (ref 0–149)

## 2014-02-06 LAB — VITAMIN D 25 HYDROXY (VIT D DEFICIENCY, FRACTURES): Vit D, 25-Hydroxy: 29.7 ng/mL — ABNORMAL LOW (ref 30.0–100.0)

## 2014-02-06 LAB — URINE CULTURE: Organism ID, Bacteria: NO GROWTH

## 2014-02-06 LAB — PROTIME-INR
INR: 1 (ref 0.8–1.2)
Prothrombin Time: 10.6 s (ref 9.1–12.0)

## 2014-02-06 LAB — APTT: aPTT: 26 s (ref 24–33)

## 2014-02-09 DIAGNOSIS — I739 Peripheral vascular disease, unspecified: Secondary | ICD-10-CM | POA: Diagnosis not present

## 2014-02-09 DIAGNOSIS — I129 Hypertensive chronic kidney disease with stage 1 through stage 4 chronic kidney disease, or unspecified chronic kidney disease: Secondary | ICD-10-CM | POA: Diagnosis not present

## 2014-02-09 DIAGNOSIS — Z4502 Encounter for adjustment and management of automatic implantable cardiac defibrillator: Secondary | ICD-10-CM | POA: Diagnosis not present

## 2014-02-09 DIAGNOSIS — Z7901 Long term (current) use of anticoagulants: Secondary | ICD-10-CM | POA: Diagnosis not present

## 2014-02-09 DIAGNOSIS — I2510 Atherosclerotic heart disease of native coronary artery without angina pectoris: Secondary | ICD-10-CM | POA: Diagnosis not present

## 2014-02-09 DIAGNOSIS — Z8674 Personal history of sudden cardiac arrest: Secondary | ICD-10-CM | POA: Diagnosis not present

## 2014-02-09 DIAGNOSIS — E780 Pure hypercholesterolemia: Secondary | ICD-10-CM | POA: Diagnosis not present

## 2014-02-09 DIAGNOSIS — Z79899 Other long term (current) drug therapy: Secondary | ICD-10-CM | POA: Diagnosis not present

## 2014-02-09 DIAGNOSIS — I252 Old myocardial infarction: Secondary | ICD-10-CM | POA: Diagnosis not present

## 2014-02-09 DIAGNOSIS — I472 Ventricular tachycardia: Secondary | ICD-10-CM | POA: Diagnosis not present

## 2014-02-09 DIAGNOSIS — I480 Paroxysmal atrial fibrillation: Secondary | ICD-10-CM | POA: Diagnosis not present

## 2014-02-09 DIAGNOSIS — Z7982 Long term (current) use of aspirin: Secondary | ICD-10-CM | POA: Diagnosis not present

## 2014-02-09 MED ORDER — SODIUM CHLORIDE 0.9 % IV SOLN
INTRAVENOUS | Status: DC
  Administered 2014-02-10: 12:00:00 via INTRAVENOUS

## 2014-02-09 MED ORDER — SODIUM CHLORIDE 0.9 % IJ SOLN: 3.0000 mL | INTRAMUSCULAR | Status: DC | PRN

## 2014-02-09 MED ORDER — GENTAMICIN SULFATE 40 MG/ML IJ SOLN
80.0000 mg | INTRAMUSCULAR | Status: AC
  Filled 2014-02-09: qty 2

## 2014-02-09 MED ORDER — CEFAZOLIN SODIUM-DEXTROSE 2-3 GM-% IV SOLR: 2.0000 g | INTRAVENOUS | Status: AC

## 2014-02-10 ENCOUNTER — Ambulatory Visit (HOSPITAL_COMMUNITY)
Admission: RE | Admit: 2014-02-10 | Discharge: 2014-02-10 | Disposition: A | Discharge status: Home / Self Care | Payer: Medicare Other | Source: Ambulatory Visit | Attending: Cardiovascular Disease | Admitting: Cardiovascular Disease

## 2014-02-10 ENCOUNTER — Encounter (HOSPITAL_COMMUNITY)
Admission: RE | Disposition: A | Discharge status: Home / Self Care | Payer: Self-pay | Source: Ambulatory Visit | Attending: Cardiovascular Disease

## 2014-02-10 ENCOUNTER — Encounter (HOSPITAL_COMMUNITY): Payer: Self-pay | Admitting: *Deleted

## 2014-02-10 DIAGNOSIS — E780 Pure hypercholesterolemia: Secondary | ICD-10-CM | POA: Diagnosis not present

## 2014-02-10 DIAGNOSIS — Z95810 Presence of automatic (implantable) cardiac defibrillator: Secondary | ICD-10-CM | POA: Diagnosis present

## 2014-02-10 DIAGNOSIS — Z79899 Other long term (current) drug therapy: Secondary | ICD-10-CM | POA: Insufficient documentation

## 2014-02-10 DIAGNOSIS — Z7901 Long term (current) use of anticoagulants: Secondary | ICD-10-CM | POA: Insufficient documentation

## 2014-02-10 DIAGNOSIS — I2510 Atherosclerotic heart disease of native coronary artery without angina pectoris: Secondary | ICD-10-CM | POA: Diagnosis not present

## 2014-02-10 DIAGNOSIS — Z4502 Encounter for adjustment and management of automatic implantable cardiac defibrillator: Secondary | ICD-10-CM

## 2014-02-10 DIAGNOSIS — I252 Old myocardial infarction: Secondary | ICD-10-CM | POA: Insufficient documentation

## 2014-02-10 DIAGNOSIS — I739 Peripheral vascular disease, unspecified: Secondary | ICD-10-CM | POA: Insufficient documentation

## 2014-02-10 DIAGNOSIS — I129 Hypertensive chronic kidney disease with stage 1 through stage 4 chronic kidney disease, or unspecified chronic kidney disease: Secondary | ICD-10-CM | POA: Insufficient documentation

## 2014-02-10 DIAGNOSIS — I480 Paroxysmal atrial fibrillation: Secondary | ICD-10-CM | POA: Insufficient documentation

## 2014-02-10 DIAGNOSIS — Z7982 Long term (current) use of aspirin: Secondary | ICD-10-CM | POA: Insufficient documentation

## 2014-02-10 DIAGNOSIS — Z8674 Personal history of sudden cardiac arrest: Secondary | ICD-10-CM | POA: Insufficient documentation

## 2014-02-10 DIAGNOSIS — I472 Ventricular tachycardia: Secondary | ICD-10-CM | POA: Insufficient documentation

## 2014-02-10 DIAGNOSIS — I4729 Other ventricular tachycardia: Secondary | ICD-10-CM

## 2014-02-10 HISTORY — PX: IMPLANTABLE CARDIOVERTER DEFIBRILLATOR (ICD) GENERATOR CHANGE: SHX5469

## 2014-02-10 LAB — BASIC METABOLIC PANEL
ANION GAP: 6 (ref 5–15)
BUN: 22 mg/dL (ref 6–23)
CO2: 22 mmol/L (ref 19–32)
Calcium: 9.6 mg/dL (ref 8.4–10.5)
Chloride: 108 mmol/L (ref 96–112)
Creatinine, Ser: 1.73 mg/dL — ABNORMAL HIGH (ref 0.50–1.10)
GFR calc Af Amer: 31 mL/min — ABNORMAL LOW (ref >90)
GFR calc non Af Amer: 27 mL/min — ABNORMAL LOW (ref >90)
GLUCOSE: 102 mg/dL — AB (ref 70–99)
Potassium: 4.6 mmol/L (ref 3.5–5.1)
SODIUM: 136 mmol/L (ref 135–145)

## 2014-02-10 LAB — SURGICAL PCR SCREEN
MRSA, PCR: NEGATIVE
Staphylococcus aureus: NEGATIVE

## 2014-02-10 SURGERY — ICD GENERATOR CHANGE

## 2014-02-10 MED ORDER — CEFAZOLIN SODIUM-DEXTROSE 2-3 GM-% IV SOLR
INTRAVENOUS | Status: AC
  Filled 2014-02-10: qty 50

## 2014-02-10 MED ORDER — METOPROLOL TARTRATE 25 MG PO TABS: 25.0000 mg | ORAL_TABLET | Freq: Two times a day (BID) | ORAL | Status: DC

## 2014-02-10 MED ORDER — APIXABAN 2.5 MG PO TABS: 2.5000 mg | ORAL_TABLET | Freq: Two times a day (BID) | ORAL | Status: DC

## 2014-02-10 MED ORDER — MUPIROCIN 2 % EX OINT
1.0000 "application " | TOPICAL_OINTMENT | Freq: Once | CUTANEOUS | Status: AC
  Administered 2014-02-10: 1 via TOPICAL

## 2014-02-10 MED ORDER — FENTANYL CITRATE 0.05 MG/ML IJ SOLN
INTRAMUSCULAR | Status: AC
  Filled 2014-02-10: qty 2

## 2014-02-10 MED ORDER — SODIUM CHLORIDE 0.9 % IV SOLN: INTRAVENOUS | Status: DC

## 2014-02-10 MED ORDER — ACETAMINOPHEN 325 MG PO TABS
325.0000 mg | ORAL_TABLET | ORAL | Status: DC | PRN
  Filled 2014-02-10: qty 2

## 2014-02-10 MED ORDER — HEPARIN (PORCINE) IN NACL 2-0.9 UNIT/ML-% IJ SOLN
INTRAMUSCULAR | Status: AC
  Filled 2014-02-10: qty 500

## 2014-02-10 MED ORDER — NITROGLYCERIN 0.4 MG SL SUBL
SUBLINGUAL_TABLET | SUBLINGUAL | Status: AC
  Filled 2014-02-10: qty 3

## 2014-02-10 MED ORDER — MUPIROCIN 2 % EX OINT
TOPICAL_OINTMENT | CUTANEOUS | Status: AC
  Filled 2014-02-10: qty 22

## 2014-02-10 MED ORDER — ONDANSETRON HCL 4 MG/2ML IJ SOLN: 4.0000 mg | Freq: Four times a day (QID) | INTRAMUSCULAR | Status: DC | PRN

## 2014-02-10 MED ORDER — LIDOCAINE HCL (PF) 1 % IJ SOLN
INTRAMUSCULAR | Status: AC
  Filled 2014-02-10: qty 60

## 2014-02-10 MED ORDER — NITROGLYCERIN 0.4 MG SL SUBL
0.4000 mg | SUBLINGUAL_TABLET | SUBLINGUAL | Status: DC | PRN
  Administered 2014-02-10 (×2): 0.4 mg via SUBLINGUAL
  Filled 2014-02-10: qty 25

## 2014-02-10 MED ORDER — MIDAZOLAM HCL 5 MG/5ML IJ SOLN
INTRAMUSCULAR | Status: AC
Start: 2014-02-10 — End: 2014-02-10
  Filled 2014-02-10: qty 5

## 2014-02-10 MED ORDER — DISOPYRAMIDE PHOSPHATE ER 100 MG PO CP12: 100.0000 mg | ORAL_CAPSULE | Freq: Two times a day (BID) | ORAL | Status: DC

## 2014-02-10 NOTE — Interval H&P Note (Signed)
History and Physical Interval Note:  02/10/2014 1:38 PM  [name]  has presented today for surgery, with the diagnosis of eol  The various methods of treatment have been discussed with the patient and family. After consideration of risks, benefits and other options for treatment, the patient has consented to  Procedure(s): ICD GENERATOR CHANGE (N/A) as a surgical intervention .  The patient's history has been reviewed, patient examined, no change in status, stable for surgery.  I have reviewed the patient's chart and labs.  Questions were answered to the patient's satisfaction.    Transient chest pain reported during admission has resolved. ECG shows minor IVCD, NSR, no high risk repolarization changes. Now pain free. Recent cath December 2015 (similar symptoms during stress test) showed no new coronary problems.   Rose Hippler

## 2014-02-10 NOTE — H&P (View-Only) (Signed)
Patient ID: [name], female   DOB: March 14, 1931, 81 y.o.   MRN: [id number]      Reason for office visit AICD at [facility]  [name] has a history of adrenergically mediated polymorphic VT and received her first implantable defibrillator in 1993. She has been treated with disopyramide for a long time and has not had any recent defibrillator discharges. Her latest device was implanted in 2009 when she also received a new endocardial defibrillator lead. Much later she developed coronary disease and received stents to the ramus intermedius artery and LAD artery. There has been no evidence of disease progression by subsequent cardiac catheterizations in 2006 & 2013 & late 2015.She has normal left ventricular systolic function and no overt heart failure. Most recent assessment of LVEF was in December 2015 when echo, nuclear perfusion study both showed EF of 55-60 percent. She had atrial fibrillation around the time of her initial cardiac arrest in 1993 and again during a hospitalization for acute pyelonephritis related sepsis in September 2015. She has evidence of rate related left bundle branch block during atrial fibrillation with rapid ventricular response. During sinus rhythm she has a nonspecific intraventricular conduction delay with a QRS duration of around 129 ms and a long PR interval. During atrial fibrillation she had frank left bundle branch block with a QRS duration of 157 ms. She is currently receiving anticoagulation with apixaban, so far without bleeding complications. She does not have a history of stroke or TIA.  Her defibrillator reached elective replacement indicator on 12/07/2013. She is here to discuss device replacement. She has had several recent dizzy spells, not associated with any ICD detected arrhythmia. There has been no atrial fibrillation since September she has only 3% ventricular pacing.  She has had recurrent dizziness (thrice since last Friday) and her BP during these  events is lower than usual , but not frankly abnormal (yesterday during a spell 111/60 mm Hg). Dizziness resolves after sitting down. No arrhythmia recorded by her device around the time of the dizzy episodes.   Allergies  Allergen Reactions  . Actos [Pioglitazone] Swelling  . Contrast Media [Iodinated Diagnostic Agents] Other (See Comments)    Headache (no action/pre-med required)  . Latex Rash  . Lipitor [Atorvastatin] Other (See Comments)    myalgia  . Protonix [Pantoprazole Sodium] Diarrhea    Current Outpatient Prescriptions  Medication Sig Dispense Refill  . acetaminophen (TYLENOL) 325 MG tablet Take 650 mg by mouth every 6 (six) hours as needed for mild pain or moderate pain.     Marland Kitchen apixaban (ELIQUIS) 2.5 MG TABS tablet Take 1 tablet (2.5 mg total) by mouth 2 (two) times daily. 60 tablet 5  . aspirin EC 81 MG tablet Take 81 mg by mouth daily.    . Calcium Citrate-Vitamin D (CITRACAL + D PO) Take 1 tablet by mouth daily.    . carboxymethylcellulose (REFRESH PLUS) 0.5 % SOLN Place 1 drop into both eyes 3 (three) times daily as needed (dry eyes).     . Cholecalciferol (VITAMIN D) 1000 UNITS capsule Take 2,000 Units by mouth daily.     . disopyramide (NORPACE) 100 MG capsule TAKE (2) CAPSULES TWICE DAILY. 120 capsule 3  . lisinopril (PRINIVIL,ZESTRIL) 20 MG tablet Take 1 tablet (20 mg total) by mouth 2 (two) times daily. Patient takes doses 12 hours apart at 1000am and 2200pm 60 tablet 6  . metoprolol tartrate (LOPRESSOR) 25 MG tablet Take 1 tablet (25 mg total) by mouth 2 (two) times daily.  Patient takes doses 12 hours apart at 1000am and 2200pm 180 tablet 1  . Multiple Vitamin (MULTIVITAMIN WITH MINERALS) TABS tablet Take 1 tablet by mouth daily.    . nitroGLYCERIN (NITROSTAT) 0.4 MG SL tablet Place 1 tablet (0.4 mg total) under the tongue every 5 (five) minutes as needed for chest pain. 25 tablet 4  . pantoprazole (PROTONIX) 40 MG tablet Take 1 tablet (40 mg total) by mouth 2 (two)  times daily. 60 tablet 1  . PARoxetine (PAXIL) 20 MG tablet Take 1 tablet (20 mg total) by mouth every morning. 90 tablet 0  . rosuvastatin (CRESTOR) 40 MG tablet Take 20 mg by mouth every evening.     . torsemide (DEMADEX) 20 MG tablet Take 20 mg by mouth daily as needed (edema).     No current facility-administered medications for this visit.    Past Medical History  Diagnosis Date  . ICD (implantable cardiac defibrillator) in place     a. s/p initial ICD in 1993 in setting of cardiac arrest;  b. 01/2007 gen change: Guidant T135 Vitality DS VR single lead ICD.  Marland Kitchen Hypertension   . GERD (gastroesophageal reflux disease)     omeprazole  . Diverticulosis of colon (without mention of hemorrhage) 2007    Colonoscopy   . CAD (coronary artery disease)     a. history of cardiac arrest 1993;  b. s/p LAD/LCX stenting in 2003;  c. 07/2011 Cath: LM nl, LAD patent stent, D1 80ost, LCX patent stent, RCA min irregs;  d. 04/2012 MV: EF 66%, no ishcemia;  e. 12/2013 Echo: Ef 55%, no rwma, Gr 1 DD, triv AI/MR, mildly dil LA;  f. 12/2013 Lexi MV: intermediate risk - apical ischemia and inf/infsept fixed defect, ? artifactual.  . H/O hiatal hernia   . Orthostatic hypotension   . Chronic sinus bradycardia   . CKD (chronic kidney disease), stage III   . History of DVT in the past, not on Coumadin now     left  leg  . HYPERCHOLESTEROLEMIA   . Nephrolithiasis, just saw Dr [name]   . EXTERNAL HEMORRHOIDS   . ARTHRITIS   . Esophagitis, unspecified     a. 2012 EGD  . Unspecified gastritis and gastroduodenitis without mention of hemorrhage     a. 2003 EGD->not noted on 2012 EGD.  . Macular degeneration     gets injecton in eye every 5 weeks- last injection - 05/03/2013   . Arthritis   . Peripheral vascular disease     ???  . Hiatal hernia     a. 2012 EGD.  Marland Kitchen Esophageal stricture     a. 2012 s/p dil.  . Myocardial infarction 1993  . AICD (automatic cardioverter/defibrillator) present   . CAD in  native artery 12/31/2013    Previous stents to LAD and Ramus patent on cath today 12/31/13 also There is severe disease in the ostial first diagonal which is unchanged from most recent cardiac catheterization. The right coronary artery could not be engaged selectively but nonselective angiography showed no significant disease in the proximal and midsegment.       Past Surgical History  Procedure Laterality Date  . Cardiac defibrillator placement  02/05/2007    Guidant  . Hemorroidectomy  80's  . Hysteroscopy w/d&c  09/02/2010    Procedure: DILATATION AND CURETTAGE (D&C) /HYSTEROSCOPY;  Surgeon: Margarette Asal;  Location: [facility];  Service: Gynecology;  Laterality: N/A;  Dilation and Curettage with Hysteroscopy and Polypectomy  . Cardiac  catheterization    . Coronary stents     . Cystoscopy with ureteroscopy Right 05/20/2013    Procedure: CYSTOSCOPY, RIGHT URETEROSCOPY STONE EXTRACTION, Insertion of right DOUBLE J STENT ;  Surgeon: Irine Seal, MD;  Location: [facility];  Service: Urology;  Laterality: Right;  . Holmium laser application Right [id number]    Procedure: HOLMIUM LASER APPLICATION;  Surgeon: Irine Seal, MD;  Location: [facility];  Service: Urology;  Laterality: Right;  . Cystoscopy with ureteroscopy and stent placement N/A 06/03/2013    Procedure: SECOND LOOK CYSTOSCOPY WITH URETEROSCOPY  HOLMIUM LASER LITHO AND STONE EXTRACTION Sammie Bench ;  Surgeon: Malka So, MD;  Location: [facility];  Service: Urology;  Laterality: N/A;  . Left heart catheterization with coronary angiogram N/A 07/28/2011    Procedure: LEFT HEART CATHETERIZATION WITH CORONARY ANGIOGRAM;  Surgeon: Lorretta Harp, MD;  Location: [facility] C[facility] LAB;  Service: Cardiovascular;  Laterality: N/A;  . Cardiovascular stress test  12/30/2013    abnormal  . Cardiac catheterization  01/01/15    patent stents -there is severe disease in ostial 1st diag but without change.   . Left heart catheterization with coronary angiogram N/A 12/31/2013     Procedure: LEFT HEART CATHETERIZATION WITH CORONARY ANGIOGRAM;  Surgeon: Wellington Hampshire, MD;  Location: [facility];  Service: Cardiovascular;  Laterality: N/A;    Family History  Problem Relation Age of Onset  . Stroke Mother   . Other Mother     brain tumor  . Other Father     MI    History   Social History  . Marital Status: Widowed    Spouse Name: N/A    Number of Children: N/A  . Years of Education: N/A   Occupational History  . Not on file.   Social History Main Topics  . Smoking status: Never Smoker   . Smokeless tobacco: Never Used  . Alcohol Use: No  . Drug Use: No  . Sexual Activity: No   Other Topics Concern  . Not on file   Social History Narrative    Review of systems: Dizziness when standing, improves with sitting, never occurs while laying down. The patient specifically denies any chest pain at rest or with exertion, dyspnea at rest or with exertion, orthopnea, paroxysmal nocturnal dyspnea, syncope, palpitations, focal neurological deficits, intermittent claudication, lower extremity edema, unexplained weight gain, cough, hemoptysis or wheezing.  The patient also denies abdominal pain, nausea, vomiting, dysphagia, constipation, polyuria, polydipsia, dysuria, hematuria, frequency, urgency, abnormal bleeding or bruising, fever, chills, unexpected weight changes, mood swings, change in skin or hair texture, change in voice quality, auditory or visual problems, allergic reactions or rashes, new musculoskeletal complaints other than usual "aches and pains".  PHYSICAL EXAM BP 116/68 mmHg  Pulse 60  Resp 20  Ht 5\' 4"  (1.626 m)  Wt 174 lb 8 oz (79.153 kg)  BMI 29.94 kg/m2 General: Alert, oriented x3, no distress  Head: no evidence of trauma, PERRL, EOMI, no exophtalmos or lid lag, no myxedema, no xanthelasma; normal ears, nose and oropharynx  Neck: normal jugular venous pulsations and no hepatojugular reflux; brisk carotid pulses without delay and no  carotid bruits  Chest: clear to auscultation, no signs of consolidation by percussion or palpation, normal fremitus, symmetrical and full respiratory excursions; and right subclavian scar is, healthy defibrillator site in the right subclavian area  Cardiovascular: normal position and quality of the apical impulse, regular rhythm, normal first and second heart sounds, no murmurs, rubs or  gallops  Abdomen: no tenderness or distention, no masses by palpation, no abnormal pulsatility or arterial bruits, normal bowel sounds, no hepatosplenomegaly  Extremities: no clubbing, cyanosis; trivial ankle edema; bilateral mild stasis dermatitis;varicose veins more obvious on the right than on the left; 2+ radial, ulnar and brachial pulses bilaterally; 2+ right femoral, posterior tibial and dorsalis pedis pulses; 2+ left femoral, posterior tibial and dorsalis pedis pulses; no subclavian or femoral bruits  Neurological: grossly nonfocal   Lipid Panel     Component Value Date/Time   CHOL 147 09/09/2013 0841   TRIG 134 09/09/2013 0841   TRIG 161* 07/31/2012 1019   TRIG 169* 09/02/2010 0629   HDL 55 09/09/2013 0841   HDL 50 09/02/2010 0629   CHOLHDL 2.8 09/02/2010 0629   VLDL 34 09/02/2010 0629   LDLCALC 65 09/09/2013 0841   LDLCALC 65 07/31/2012 1019   LDLCALC 58 09/02/2010 0629    BMET    Component Value Date/Time   NA 141 12/30/2013 1540   NA 141 09/09/2013 0841   K 5.1 12/30/2013 1540   CL 105 12/30/2013 1540   CO2 24 12/30/2013 1540   GLUCOSE 89 12/30/2013 1540   GLUCOSE 94 09/09/2013 0841   BUN 20 12/30/2013 1540   BUN 25 09/09/2013 0841   CREATININE 1.21* 12/30/2013 1540   CREATININE 1.52* 07/31/2012 1019   CALCIUM 9.8 12/30/2013 1540   GFRNONAA 41* 12/30/2013 1540   GFRNONAA 33* 07/31/2012 1019   GFRAA 48* 12/30/2013 1540   GFRAA 38* 07/31/2012 1019     ASSESSMENT AND PLAN  Dizziness Suspect orthostatic hypotension. Make sure she is well hydrated. These spells seem to  correlate with use of immediate release disopyramide, since Norpace CR has not been available recently.   Paroxysmal atrial fibrillation Continue anticoagulation. Likelihood of recurrence is high and CHADSVasc score is 5. Disopyramide should help reduce the prevalence of events. Continue beta blocker as well.  CAD, s/p LAD and Dx stenting in 2003- patent 2006 and 2013 and December 2015. Residual 80% Dx1  No signs or symptoms of coronary insufficiency or heart failure at this time.   Automatic implantable cardioverter-defibrillator at [facility] Excellent lead parameters. The device appropriately discriminated the supraventricular mechanism for her tachycardia during PAF and the ventricular rate did not really approach therapy zones. She does not require pacing for bradycardia. She had a DVT with initial lead implantation. Atrial lead implantation and upgrade to dual chamber device was considered, but risks probably outweigh benefits. Hold Eliquis 48 hours before generator changeout. This procedure has been fully reviewed with the patient and written informed consent has been obtained.    Holli Humbles, MD, [place] [phone] office [phone] pager

## 2014-02-10 NOTE — Progress Notes (Signed)
ICD Criteria  Current LVEF:60% ;Obtained > or = 1 month ago and < or = 3 months ago.  NYHA Functional Classification: Class I  Heart Failure History:  No.  Non-Ischemic Dilated Cardiomyopathy History:  No.  Atrial Fibrillation/Atrial Flutter:  Yes, A-Fib/A-Flutter type: Paroxysmal.  Ventricular Tachycardia History:  Yes, Hemodynamic instability present, VT Type:  SVT - Polymorphic.  Cardiac Arrest History:  Yes, This was a Ventricular Tachycardia/Ventricular Fibrillation Arrest. This was NOT a bradycardia arrest.  History of Syndromes with Risk of Sudden Death:  Yes, Catecholaminergic Polymorphic VT  Previous ICD:  Yes, ICD Type:  Single, Reason for ICD:  Secondary, reason for secondary prevention:  Ventricular Tachycardia  Electrophysiology Study: Yes, EP Study timeframe: > 6 months, Ventricular arrhythmias induced,  VT ablation NOT performed.  Prior MI: No.  PPM: No.  OSA:  No  Patient Life Expectancy of >=1 year: Yes.  Anticoagulation Therapy:  Patient is on anticoagulation therapy, anticoagulation was held prior to procedure.   Beta Blocker Therapy:  Yes.   Ace Inhibitor/ARB Therapy:  Yes.

## 2014-02-10 NOTE — Progress Notes (Signed)
Worsening renal function noted. Patient is actually taking Norpace CR 200 mg BID per her report.  Decrease to 100 mg BID, may need to discontinue altogether if renal function does not recover.  Some of her symptoms may be disopyramide induced side effects.  Sanda Klein, MD, [facility] [phone] office [phone] pager

## 2014-02-10 NOTE — Discharge Instructions (Signed)
Pacemaker Battery Change, Care After °Refer to this sheet in the next few weeks. These instructions provide you with information on caring for yourself after your procedure. Your health care provider may also give you more specific instructions. Your treatment has been planned according to current medical practices, but problems sometimes occur. Call your health care provider if you have any problems or questions after your procedure. °WHAT TO EXPECT AFTER THE PROCEDURE °After your procedure, it is typical to have the following sensations: °· Soreness at the pacemaker site. °HOME CARE INSTRUCTIONS  °· Keep the incision clean and dry. °· Unless advised otherwise, you may shower beginning 48 hours after your procedure. °· For the first week after the replacement, avoid stretching motions that pull at the incision site, and avoid heavy exercise with the arm that is on the same side as the incision. °· Take medicines only as directed by your health care provider. °· Keep all follow-up visits as directed by your health care provider. °SEEK MEDICAL CARE IF:  °· You have pain at the incision site that is not relieved by over-the-counter or prescription medicine. °· There is drainage or pus from the incision site. °· There is swelling larger than a lime at the incision site. °· You develop red streaking that extends above or below the incision site. °· You feel brief, intermittent palpitations, light-headedness, or any symptoms that you feel might be related to your heart. °SEEK IMMEDIATE MEDICAL CARE IF:  °· You experience chest pain that is different than the pain at the pacemaker site. °· You experience shortness of breath. °· You have palpitations or irregular heartbeat. °· You have light-headedness that does not go away quickly. °· You faint. °· You have pain that gets worse and is not relieved by medicine. °Document Released: 10/23/2012 Document Revised: 05/19/2013 Document Reviewed: 10/23/2012 °Exit[employer]® Patient  Information ©2015 [employer]. This information is not intended to replace advice given to you by your health care provider. Make sure you discuss any questions you have with your health care provider. ° °

## 2014-02-11 NOTE — CV Procedure (Signed)
Procedure report  Procedure performed:  1. Single chamber ICD generator changeout  2. Light sedation  Reason for procedure:  1. Device generator at elective replacement interval  2. Cathecholaminergic polymorphic VT Procedure performed by:  Sanda Klein, MD  Complications:  None  Estimated blood loss:  <5 mL  Medications administered during procedure:  Ancef 2 g intravenously, lidocaine 1% 30 mL locally, fentanyl 25 mcg intravenously, Versed 1 mg intravenously Device details:     Procedure details:  After the risks and benefits of the procedure were discussed the patient provided informed consent. She was brought to the cardiac catheter lab in the fasting state. The patient was prepped and draped in usual sterile fashion. Local anesthesia with 1% lidocaine was administered to to the left infraclavicular area. A 5-6cm horizontal incision was made parallel with and 2-3 cm caudal to the left clavicle, in the area of an old scar. An older scar was seen closer to the left clavicle. Using minimal electrocautery and mostly sharp and blunt dissection the prepectoral pocket was opened carefully to avoid injury to the loops of chronic leads. Extensive dissection was not necessary. The device was explanted. The pocket was carefully inspected for hemostasis and flushed with copious amounts of antibiotic solution.  The lead was disconnected from the old generator and testing of the lead parameters later showed excellent values. The new generator was connected to the chronic leads, with appropriate pacing noted.   The entire system was then carefully inserted in the pocket with care been taking that the leads and device assumed a comfortable position without pressure on the incision. Great care was taken that the leads be located deep to the generator. The pocket was then closed in layers using 2 layers of 2-0 Vicryl and cutaneous staples after which a sterile dressing was applied.   At the end of the  procedure the following lead parameters were encountered:    Sanda Klein, MD, [facility] [phone] office [phone] pager

## 2014-02-19 ENCOUNTER — Other Ambulatory Visit (INDEPENDENT_AMBULATORY_CARE_PROVIDER_SITE_OTHER): Payer: Medicare Other

## 2014-02-19 ENCOUNTER — Encounter: Payer: Self-pay | Admitting: *Deleted

## 2014-02-19 DIAGNOSIS — Z7901 Long term (current) use of anticoagulants: Secondary | ICD-10-CM

## 2014-02-19 DIAGNOSIS — N390 Urinary tract infection, site not specified: Secondary | ICD-10-CM | POA: Diagnosis not present

## 2014-02-19 LAB — POCT UA - MICROSCOPIC ONLY
BACTERIA, U MICROSCOPIC: NEGATIVE
CASTS, UR, LPF, POC: NEGATIVE
Crystals, Ur, HPF, POC: NEGATIVE
Mucus, UA: NEGATIVE
YEAST UA: NEGATIVE

## 2014-02-19 LAB — POCT URINALYSIS DIPSTICK
BILIRUBIN UA: NEGATIVE
GLUCOSE UA: NEGATIVE
Ketones, UA: NEGATIVE
NITRITE UA: NEGATIVE
PH UA: 5
Protein, UA: NEGATIVE
Spec Grav, UA: 1.01
UROBILINOGEN UA: NEGATIVE

## 2014-02-19 NOTE — Addendum Note (Signed)
Addended by: Earlene Plater on: 02/19/2014 10:27 AM   Modules accepted: Orders

## 2014-02-19 NOTE — Progress Notes (Signed)
Lab only 

## 2014-02-20 ENCOUNTER — Ambulatory Visit (INDEPENDENT_AMBULATORY_CARE_PROVIDER_SITE_OTHER): Payer: Medicare Other | Admitting: Cardiovascular Disease

## 2014-02-20 ENCOUNTER — Encounter: Payer: Self-pay | Admitting: Cardiovascular Disease

## 2014-02-20 VITALS — BP 122/62 | HR 60 | Ht 64.0 in | Wt 180.0 lb

## 2014-02-20 DIAGNOSIS — I480 Paroxysmal atrial fibrillation: Secondary | ICD-10-CM | POA: Diagnosis not present

## 2014-02-20 DIAGNOSIS — I2510 Atherosclerotic heart disease of native coronary artery without angina pectoris: Secondary | ICD-10-CM

## 2014-02-20 DIAGNOSIS — I472 Ventricular tachycardia: Secondary | ICD-10-CM | POA: Diagnosis not present

## 2014-02-20 DIAGNOSIS — I4729 Other ventricular tachycardia: Secondary | ICD-10-CM

## 2014-02-20 DIAGNOSIS — N189 Chronic kidney disease, unspecified: Secondary | ICD-10-CM | POA: Diagnosis not present

## 2014-02-20 DIAGNOSIS — N183 Chronic kidney disease, stage 3 unspecified: Secondary | ICD-10-CM

## 2014-02-20 LAB — BMP8+EGFR
BUN / CREAT RATIO: 16 (ref 11–26)
BUN: 22 mg/dL (ref 8–27)
CHLORIDE: 103 mmol/L (ref 97–108)
CO2: 20 mmol/L (ref 18–29)
Calcium: 9 mg/dL (ref 8.7–10.3)
Creatinine, Ser: 1.36 mg/dL — ABNORMAL HIGH (ref 0.57–1.00)
GFR calc Af Amer: 42 mL/min/{1.73_m2} — ABNORMAL LOW (ref >59)
GFR calc non Af Amer: 37 mL/min/{1.73_m2} — ABNORMAL LOW (ref >59)
Glucose: 160 mg/dL — ABNORMAL HIGH (ref 65–99)
Potassium: 5.2 mmol/L (ref 3.5–5.2)
Sodium: 139 mmol/L (ref 134–144)

## 2014-02-20 LAB — URINE CULTURE

## 2014-02-20 NOTE — Progress Notes (Signed)
Patient ID: [name], female   DOB: April 02, 1931, 81 y.o.   MRN: [id number]     Reason for office visit Paroxysmal atrial fibrillation, recent ICD generator change out, history of cathecholaminergic polymorphic VT   After reduction in her dose of disopyramide, [name] has fear complaints of dizziness, atypical chest discomfort and nausea. The defibrillator site is healing very nicely following her recent generator change out. The staples were removed today. The skin is almost completely healed and there is no evidence of infection.  [name] has a history of adrenergically mediated polymorphic VT and received her first implantable defibrillator in 1993. She has been treated with disopyramide for a long time and has not had any recent defibrillator discharges. Her latest device was implanted in 2009 when she also received a new endocardial defibrillator lead. Much later she developed coronary disease and received stents to the ramus intermedius artery and LAD artery. There has been no evidence of disease progression by subsequent cardiac catheterizations in 2006 & 2013 & late 2015.She has normal left ventricular systolic function and no overt heart failure. Most recent assessment of LVEF was in December 2015 when echo, nuclear perfusion study both showed EF of 55-60 percent. She had atrial fibrillation around the time of her initial cardiac arrest in 1993 and again during a hospitalization for acute pyelonephritis related sepsis in September 2015. She has evidence of rate related left bundle branch block during atrial fibrillation with rapid ventricular response. During sinus rhythm she has a nonspecific intraventricular conduction delay with a QRS duration of around 129 ms and a long PR interval. During atrial fibrillation she had frank left bundle branch block with a QRS duration of 157 ms. She is currently receiving anticoagulation with apixaban, so far without bleeding complications. She  does not have a history of stroke or TIA.  Allergies  Allergen Reactions  . Adhesive [Tape] Other (See Comments)    Causes sores  . Actos [Pioglitazone] Swelling  . Contrast Media [Iodinated Diagnostic Agents] Other (See Comments)    Headache (no action/pre-med required)  . Latex Rash  . Lipitor [Atorvastatin] Other (See Comments)    myalgia  . Protonix [Pantoprazole Sodium] Diarrhea    Current Outpatient Prescriptions  Medication Sig Dispense Refill  . acetaminophen (TYLENOL) 325 MG tablet Take 650 mg by mouth every 6 (six) hours as needed for mild pain or moderate pain.     Marland Kitchen apixaban (ELIQUIS) 2.5 MG TABS tablet Take 1 tablet (2.5 mg total) by mouth 2 (two) times daily. 60 tablet 5  . aspirin EC 81 MG tablet Take 81 mg by mouth daily.    . Calcium Citrate-Vitamin D (CITRACAL + D PO) Take 1 tablet by mouth daily.    . carboxymethylcellulose (REFRESH PLUS) 0.5 % SOLN Place 1 drop into both eyes 3 (three) times daily as needed (dry eyes).     . Cholecalciferol (VITAMIN D) 1000 UNITS capsule Take 2,000 Units by mouth daily.     . CRESTOR 40 MG tablet TAKE 1 TABLET DAILY 90 tablet 3  . disopyramide (NORPACE CR) 100 MG 12 hr capsule Take 1 capsule (100 mg total) by mouth 2 (two) times daily. 60 capsule 3  . lisinopril (PRINIVIL,ZESTRIL) 20 MG tablet Take 1 tablet (20 mg total) by mouth 2 (two) times daily. Patient takes doses 12 hours apart at 1000am and 2200pm 60 tablet 6  . metoprolol tartrate (LOPRESSOR) 25 MG tablet Take 1 tablet (25 mg total) by mouth 2 (two) times  daily. Patient takes doses 12 hours apart at 1000am and 2200pm 180 tablet 1  . Multiple Vitamin (MULTIVITAMIN WITH MINERALS) TABS tablet Take 1 tablet by mouth daily.    . nitroGLYCERIN (NITROSTAT) 0.4 MG SL tablet Place 1 tablet (0.4 mg total) under the tongue every 5 (five) minutes as needed for chest pain. 25 tablet 4  . pantoprazole (PROTONIX) 40 MG tablet Take 1 tablet (40 mg total) by mouth 2 (two) times daily.  (Patient taking differently: Take 40 mg by mouth daily. ) 60 tablet 1  . PARoxetine (PAXIL) 20 MG tablet Take 1 tablet (20 mg total) by mouth every morning. 90 tablet 0  . torsemide (DEMADEX) 20 MG tablet Take 20 mg by mouth daily as needed (edema).     No current facility-administered medications for this visit.    Past Medical History  Diagnosis Date  . ICD (implantable cardiac defibrillator) in place     a. s/p initial ICD in 1993 in setting of cardiac arrest;  b. 01/2007 gen change: Guidant T135 Vitality DS VR single lead ICD.  Marland Kitchen Hypertension   . GERD (gastroesophageal reflux disease)     omeprazole  . Diverticulosis of colon (without mention of hemorrhage) 2007    Colonoscopy   . CAD (coronary artery disease)     a. history of cardiac arrest 1993;  b. s/p LAD/LCX stenting in 2003;  c. 07/2011 Cath: LM nl, LAD patent stent, D1 80ost, LCX patent stent, RCA min irregs;  d. 04/2012 MV: EF 66%, no ishcemia;  e. 12/2013 Echo: Ef 55%, no rwma, Gr 1 DD, triv AI/MR, mildly dil LA;  f. 12/2013 Lexi MV: intermediate risk - apical ischemia and inf/infsept fixed defect, ? artifactual.  . H/O hiatal hernia   . Orthostatic hypotension   . Chronic sinus bradycardia   . CKD (chronic kidney disease), stage III   . History of DVT in the past, not on Coumadin now     left  leg  . HYPERCHOLESTEROLEMIA   . Nephrolithiasis, just saw Dr [name]   . EXTERNAL HEMORRHOIDS   . ARTHRITIS   . Esophagitis, unspecified     a. 2012 EGD  . Unspecified gastritis and gastroduodenitis without mention of hemorrhage     a. 2003 EGD->not noted on 2012 EGD.  . Macular degeneration     gets injecton in eye every 5 weeks- last injection - 05/03/2013   . Arthritis   . Peripheral vascular disease     ???  . Hiatal hernia     a. 2012 EGD.  Marland Kitchen Esophageal stricture     a. 2012 s/p dil.  . Myocardial infarction 1993  . AICD (automatic cardioverter/defibrillator) present   . CAD in native artery 12/31/2013    Previous  stents to LAD and Ramus patent on cath today 12/31/13 also There is severe disease in the ostial first diagonal which is unchanged from most recent cardiac catheterization. The right coronary artery could not be engaged selectively but nonselective angiography showed no significant disease in the proximal and midsegment.       Past Surgical History  Procedure Laterality Date  . Cardiac defibrillator placement  02/05/2007    Guidant  . Hemorroidectomy  80's  . Hysteroscopy w/d&c  09/02/2010    Procedure: DILATATION AND CURETTAGE (D&C) /HYSTEROSCOPY;  Surgeon: Margarette Asal;  Location: [facility];  Service: Gynecology;  Laterality: N/A;  Dilation and Curettage with Hysteroscopy and Polypectomy  . Cardiac catheterization    . Coronary  stents     . Cystoscopy with ureteroscopy Right 05/20/2013    Procedure: CYSTOSCOPY, RIGHT URETEROSCOPY STONE EXTRACTION, Insertion of right DOUBLE J STENT ;  Surgeon: Irine Seal, MD;  Location: [facility];  Service: Urology;  Laterality: Right;  . Holmium laser application Right [id number]    Procedure: HOLMIUM LASER APPLICATION;  Surgeon: Irine Seal, MD;  Location: [facility];  Service: Urology;  Laterality: Right;  . Cystoscopy with ureteroscopy and stent placement N/A 06/03/2013    Procedure: SECOND LOOK CYSTOSCOPY WITH URETEROSCOPY  HOLMIUM LASER LITHO AND STONE EXTRACTION Sammie Bench ;  Surgeon: Malka So, MD;  Location: [facility];  Service: Urology;  Laterality: N/A;  . Left heart catheterization with coronary angiogram N/A 07/28/2011    Procedure: LEFT HEART CATHETERIZATION WITH CORONARY ANGIOGRAM;  Surgeon: Lorretta Harp, MD;  Location: [facility] C[facility];  Service: Cardiovascular;  Laterality: N/A;  . Cardiovascular stress test  12/30/2013    abnormal  . Cardiac catheterization  01/01/15    patent stents -there is severe disease in ostial 1st diag but without change.   . Left heart catheterization with coronary angiogram N/A 12/31/2013    Procedure: LEFT HEART CATHETERIZATION  WITH CORONARY ANGIOGRAM;  Surgeon: Wellington Hampshire, MD;  Location: [facility];  Service: Cardiovascular;  Laterality: N/A;  . Implantable cardioverter defibrillator (icd) generator change N/A 02/10/2014    Procedure: ICD GENERATOR CHANGE;  Surgeon: Sanda Klein, MD;  Location: [facility] C[facility] LAB;  Service: Cardiovascular;  Laterality: N/A;    Family History  Problem Relation Age of Onset  . Stroke Mother   . Other Mother     brain tumor  . Other Father     MI    History   Social History  . Marital Status: Widowed    Spouse Name: N/A    Number of Children: N/A  . Years of Education: N/A   Occupational History  . Not on file.   Social History Main Topics  . Smoking status: Never Smoker   . Smokeless tobacco: Never Used  . Alcohol Use: No  . Drug Use: No  . Sexual Activity: No   Other Topics Concern  . Not on file   Social History Narrative    Review of systems: The patient specifically denies any chest pain at rest or with exertion, dyspnea at rest or with exertion, orthopnea, paroxysmal nocturnal dyspnea, syncope, palpitations, focal neurological deficits, intermittent claudication, lower extremity edema, unexplained weight gain, cough, hemoptysis or wheezing.  The patient also denies abdominal pain, nausea, vomiting, dysphagia, constipation, polyuria, polydipsia, dysuria, hematuria, frequency, urgency, abnormal bleeding or bruising, fever, chills, unexpected weight changes, mood swings, change in skin or hair texture, change in voice quality, auditory or visual problems, allergic reactions or rashes, new musculoskeletal complaints other than usual "aches and pains".  PHYSICAL EXAM BP 122/62 mmHg  Pulse 60  Ht 5\' 4"  (1.626 m)  Wt 81.647 kg (180 lb)  BMI 30.88 kg/m2 General: Alert, oriented x3, no distress  Head: no evidence of trauma, PERRL, EOMI, no exophtalmos or lid lag, no myxedema, no xanthelasma; normal ears, nose and oropharynx  Neck: normal jugular venous  pulsations and no hepatojugular reflux; brisk carotid pulses without delay and no carotid bruits  Chest: clear to auscultation, no signs of consolidation by percussion or palpation, normal fremitus, symmetrical and full respiratory excursions; and right subclavian scar is, rapidly healing scar at defibrillator site in the left subclavian area  Cardiovascular: normal position and quality of the  apical impulse, regular rhythm, normal first and second heart sounds, no murmurs, rubs or gallops  Abdomen: no tenderness or distention, no masses by palpation, no abnormal pulsatility or arterial bruits, normal bowel sounds, no hepatosplenomegaly  Extremities: no clubbing, cyanosis; trivial ankle edema; bilateral mild stasis dermatitis;varicose veins more obvious on the right than on the left; 2+ radial, ulnar and brachial pulses bilaterally; 2+ right femoral, posterior tibial and dorsalis pedis pulses; 2+ left femoral, posterior tibial and dorsalis pedis pulses; no subclavian or femoral bruits  Neurological: grossly nonfocal    Lipid Panel     Component Value Date/Time   CHOL 145 02/05/2014 0902   TRIG 137 02/05/2014 0902   TRIG 161* 07/31/2012 1019   TRIG 169* 09/02/2010 0629   HDL 57 02/05/2014 0902   HDL 50 09/02/2010 0629   CHOLHDL 2.8 09/02/2010 0629   VLDL 34 09/02/2010 0629   LDLCALC 65 09/09/2013 0841   LDLCALC 65 07/31/2012 1019   LDLCALC 58 09/02/2010 0629    BMET    Component Value Date/Time   NA 139 02/19/2014 0949   NA 136 02/10/2014 1146   K 5.2 02/19/2014 0949   CL 103 02/19/2014 0949   CO2 20 02/19/2014 0949   GLUCOSE 160* 02/19/2014 0949   GLUCOSE 102* 02/10/2014 1146   BUN 22 02/19/2014 0949   BUN 22 02/10/2014 1146   CREATININE 1.36* 02/19/2014 0949   CREATININE 1.52* 07/31/2012 1019   CALCIUM 9.0 02/19/2014 0949   GFRNONAA 37* 02/19/2014 0949   GFRNONAA 33* 07/31/2012 1019   GFRAA 42* 02/19/2014 0949   GFRAA 38* 07/31/2012 1019     ASSESSMENT AND  PLAN  [name] feels better after a reduction in her dose of disopyramide. It is likely that she has less tolerance to this medication as her renal function has deteriorated. If her creatinine clearance decreases further we may have to stop this medication altogether. It was prescribed for catecholaminergic polymorphic ventricular tachycardia and she has not had any events at least in the last 7 years. The disopyramide may also be helping to keep atrial fibrillation at [place]. She is on appropriate anticoagulation therapy. Her defibrillator site is healing very nicely after the recent generator changeout. Follow-up in 3 months  Halil Rentz  Sanda Klein, MD, [facility] [phone] office [phone] pager

## 2014-02-20 NOTE — Patient Instructions (Signed)
Dr Sallyanne Kuster recommends that you schedule a follow-up appointment in 3 months with a device check.

## 2014-02-21 ENCOUNTER — Telehealth: Payer: Self-pay | Admitting: *Deleted

## 2014-02-21 NOTE — Telephone Encounter (Signed)
-----   Message from Chipper Herb, MD sent at 02/20/2014  9:50 PM EST ----- The urine culture has no significant growth and if the patient is finished with antibiotics no further treatment is necessary

## 2014-02-21 NOTE — Telephone Encounter (Signed)
Pt notified of results Verbalizes understanding No urinary sxs currently

## 2014-02-26 DIAGNOSIS — H3532 Exudative age-related macular degeneration: Secondary | ICD-10-CM | POA: Diagnosis not present

## 2014-03-03 ENCOUNTER — Other Ambulatory Visit: Payer: Self-pay | Admitting: Family Medicine

## 2014-03-17 ENCOUNTER — Encounter: Payer: Self-pay | Admitting: Cardiovascular Disease

## 2014-03-23 ENCOUNTER — Encounter: Payer: Self-pay | Admitting: Family Medicine

## 2014-03-23 ENCOUNTER — Ambulatory Visit (INDEPENDENT_AMBULATORY_CARE_PROVIDER_SITE_OTHER): Payer: Medicare Other | Admitting: Family Medicine

## 2014-03-23 VITALS — BP 137/74 | HR 52 | Temp 97.1°F | Ht 64.0 in | Wt 178.0 lb

## 2014-03-23 DIAGNOSIS — I2510 Atherosclerotic heart disease of native coronary artery without angina pectoris: Secondary | ICD-10-CM

## 2014-03-23 DIAGNOSIS — H8303 Labyrinthitis, bilateral: Secondary | ICD-10-CM | POA: Diagnosis not present

## 2014-03-23 DIAGNOSIS — N390 Urinary tract infection, site not specified: Secondary | ICD-10-CM

## 2014-03-23 DIAGNOSIS — R109 Unspecified abdominal pain: Secondary | ICD-10-CM | POA: Diagnosis not present

## 2014-03-23 DIAGNOSIS — N200 Calculus of kidney: Secondary | ICD-10-CM | POA: Diagnosis not present

## 2014-03-23 DIAGNOSIS — R42 Dizziness and giddiness: Secondary | ICD-10-CM | POA: Diagnosis not present

## 2014-03-23 LAB — POCT CBC
Granulocyte percent: 58.4 %G (ref 37–80)
HCT, POC: 40.5 % (ref 37.7–47.9)
Hemoglobin: 12.4 g/dL (ref 12.2–16.2)
Lymph, poc: 2 (ref 0.6–3.4)
MCH, POC: 28.6 pg (ref 27–31.2)
MCHC: 30.6 g/dL — AB (ref 31.8–35.4)
MCV: 93.5 fL (ref 80–97)
MPV: 7.3 fL (ref 0–99.8)
POC GRANULOCYTE: 3.6 (ref 2–6.9)
POC LYMPH PERCENT: 31.7 %L (ref 10–50)
Platelet Count, POC: 247 10*3/uL (ref 142–424)
RBC: 4.33 M/uL (ref 4.04–5.48)
RDW, POC: 13.8 %
WBC: 6.2 10*3/uL (ref 4.6–10.2)

## 2014-03-23 LAB — POCT UA - MICROSCOPIC ONLY
Casts, Ur, LPF, POC: NEGATIVE
Crystals, Ur, HPF, POC: NEGATIVE
Mucus, UA: NEGATIVE
Yeast, UA: NEGATIVE

## 2014-03-23 LAB — POCT URINALYSIS DIPSTICK
Bilirubin, UA: NEGATIVE
Glucose, UA: NEGATIVE
KETONES UA: NEGATIVE
Nitrite, UA: NEGATIVE
PH UA: 6
Protein, UA: NEGATIVE
RBC UA: NEGATIVE
Spec Grav, UA: 1.015
Urobilinogen, UA: NEGATIVE

## 2014-03-23 MED ORDER — MECLIZINE HCL 12.5 MG PO TABS: 12.5000 mg | ORAL_TABLET | Freq: Three times a day (TID) | ORAL | Status: DC | PRN

## 2014-03-23 NOTE — Progress Notes (Signed)
Subjective:    Patient ID: [name], female    DOB: 14-Sep-1931, 81 y.o.   MRN: [id number]  HPI  81 year old female comes in today with complaint of right flank pain and dizziness x 1 week. After further discussion it was noted that she does have some head congestion and mild ringing in both ears. She denies dysuria or urinary urgency or frequency. She also complains of low back pain with bowel movements for the past 2 weeks. She has a history of irritable bowel syndrome. She also has a history of nephrolithiasis in the past.  Patient Active Problem List   Diagnosis Date Noted  . ICD (implantable cardioverter-defibrillator) battery depletion 01/20/2014  . Abnormal nuclear stress test, 12/30/13 12/31/2013  . CAD in native artery 12/31/2013  . Unstable angina, neg MI, cath stable.maybe GI 12/30/2013  . Paroxysmal atrial fibrillation, chads2 Vasc2 score of 5, on eliquis 10/16/2013  . Catecholaminergic polymorphic ventricular tachycardia 10/16/2013  . Ureteral stone with hydronephrosis 05/20/2013  . Metabolic syndrome 50/56/9794  . Hypotension, meds stopped this admission 07/29/2011  . Orthostatic hypotension 07/27/2011  . Chest pain, r/o cardiac, negative MI 07/25/2011  . Chronic sinus bradycardia 07/25/2011  . Automatic implantable cardioverter-defibrillator in situ 07/25/2011  . Chronic renal insufficiency, stage III (moderate), Scr 1.5 in the past 07/25/2011    Class: Chronic  . History of DVT in the past, on eliquis now 07/25/2011    Class: History of  . Nephrolithiasis, just saw Dr [name] 07/25/2011    Class: History of  . DYSPHAGIA 03/24/2010  . Chronic ischemic heart disease 12/03/2007  . RECTAL BLEEDING 12/03/2007  . Diarrhea 12/03/2007  . EPIGASTRIC PAIN 12/03/2007  . Hyperlipidemia 12/02/2007    Class: History of  . EXTERNAL HEMORRHOIDS 12/02/2007    Class: History of  . ESOPHAGITIS 12/02/2007  . GASTRITIS 12/02/2007  . COLITIS 12/02/2007  .  DIVERTICULOSIS, COLON 12/02/2007  . ARTHRITIS 12/02/2007    Class: Chronic   Outpatient Encounter Prescriptions as of 03/23/2014  Medication Sig  . acetaminophen (TYLENOL) 325 MG tablet Take 650 mg by mouth every 6 (six) hours as needed for mild pain or moderate pain.   Marland Kitchen apixaban (ELIQUIS) 2.5 MG TABS tablet Take 1 tablet (2.5 mg total) by mouth 2 (two) times daily.  Marland Kitchen aspirin EC 81 MG tablet Take 81 mg by mouth daily.  . Calcium Citrate-Vitamin D (CITRACAL + D PO) Take 1 tablet by mouth daily.  . carboxymethylcellulose (REFRESH PLUS) 0.5 % SOLN Place 1 drop into both eyes 3 (three) times daily as needed (dry eyes).   . Cholecalciferol (VITAMIN D) 1000 UNITS capsule Take 2,000 Units by mouth daily.   . CRESTOR 40 MG tablet TAKE 1 TABLET DAILY  . disopyramide (NORPACE CR) 100 MG 12 hr capsule Take 1 capsule (100 mg total) by mouth 2 (two) times daily.  Marland Kitchen lisinopril (PRINIVIL,ZESTRIL) 20 MG tablet Take 1 tablet (20 mg total) by mouth 2 (two) times daily. Patient takes doses 12 hours apart at 1000am and 2200pm  . metoprolol tartrate (LOPRESSOR) 25 MG tablet Take 1 tablet (25 mg total) by mouth 2 (two) times daily. Patient takes doses 12 hours apart at 1000am and 2200pm  . Multiple Vitamin (MULTIVITAMIN WITH MINERALS) TABS tablet Take 1 tablet by mouth daily.  . nitroGLYCERIN (NITROSTAT) 0.4 MG SL tablet Place 1 tablet (0.4 mg total) under the tongue every 5 (five) minutes as needed for chest pain.  . pantoprazole (PROTONIX) 40 MG tablet  Take 1 tablet (40 mg total) by mouth 2 (two) times daily. (Patient taking differently: Take 40 mg by mouth daily. )  . PARoxetine (PAXIL) 20 MG tablet Take 1 tablet (20 mg total) by mouth every morning.  Marland Kitchen PARoxetine (PAXIL) 20 MG tablet TAKE 1 TABLET IN MORNING  . torsemide (DEMADEX) 20 MG tablet Take 20 mg by mouth daily as needed (edema).      Review of Systems  Constitutional: Positive for chills. Negative for fever.  HENT: Positive for rhinorrhea and  tinnitus.   Eyes: Negative.   Respiratory: Negative.   Cardiovascular: Negative.   Gastrointestinal: Negative.   Endocrine: Negative.   Genitourinary: Positive for dysuria (mild) and flank pain (right). Negative for urgency and frequency.  Musculoskeletal: Positive for back pain (with bowel movement).  Skin: Negative.   Allergic/Immunologic: Negative.   Neurological: Positive for dizziness and headaches.  Hematological: Negative.   Psychiatric/Behavioral: Negative.        Objective:   Physical Exam  Constitutional: She is oriented to person, place, and time. She appears well-developed and well-nourished. No distress.  HENT:  Head: Normocephalic and atraumatic.  Right Ear: External ear normal.  Left Ear: External ear normal.  Nose: Nose normal.  Mouth/Throat: Oropharynx is clear and moist. No oropharyngeal exudate.  Minimal dizziness reproduced with head movement  Eyes: Conjunctivae and EOM are normal. Pupils are equal, round, and reactive to light. Right eye exhibits no discharge. Left eye exhibits no discharge. No scleral icterus.  Neck: Normal range of motion. Neck supple. No thyromegaly present.  No carotid bruits or thyroid enlargement  Cardiovascular: Normal rate, regular rhythm and normal heart sounds.   No murmur heard. At 60/m  Pulmonary/Chest: Effort normal and breath sounds normal. No respiratory distress. She has no wheezes. She has no rales. She exhibits no tenderness.  Abdominal: Soft. Bowel sounds are normal. She exhibits no mass. There is no tenderness. There is no rebound and no guarding.  Generally tender but more on the right side and the right flight.  Musculoskeletal: Normal range of motion. She exhibits no edema.  Lymphadenopathy:    She has no cervical adenopathy.  Neurological: She is alert and oriented to person, place, and time.  Skin: Skin is warm and dry. No rash noted.  Psychiatric: She has a normal mood and affect. Her behavior is normal. Judgment  and thought content normal.  Nursing note and vitals reviewed.   BP 137/74 mmHg  Pulse 52  Temp(Src) 97.1 F (36.2 C) (Oral)  Ht 5' 4"  (1.626 m)  Wt 178 lb (80.74 kg)  BMI 30.54 kg/m2  Results for orders placed or performed in visit on 03/23/14  POCT UA - Microscopic Only  Result Value Ref Range   WBC, Ur, HPF, POC 10-15    RBC, urine, microscopic 1-3    Bacteria, U Microscopic few    Mucus, UA negative    Epithelial cells, urine per micros few    Crystals, Ur, HPF, POC negative    Casts, Ur, LPF, POC negative    Yeast, UA negative   POCT urinalysis dipstick  Result Value Ref Range   Color, UA gold    Clarity, UA clear    Glucose, UA negative    Bilirubin, UA negative    Ketones, UA negative    Spec Grav, UA 1.015    Blood, UA negative    pH, UA 6.0    Protein, UA negative    Urobilinogen, UA negative    Nitrite,  UA negative    Leukocytes, UA small (1+)   POCT CBC  Result Value Ref Range   WBC 6.2 4.6 - 10.2 K/uL   Lymph, poc 2.0 0.6 - 3.4   POC LYMPH PERCENT 31.7 10 - 50 %L   POC Granulocyte 3.6 2 - 6.9   Granulocyte percent 58.4 37 - 80 %G   RBC 4.33 4.04 - 5.48 M/uL   Hemoglobin 12.4 12.2 - 16.2 g/dL   HCT, POC 40.5 37.7 - 47.9 %   MCV 93.5 80 - 97 fL   MCH, POC 28.6 27 - 31.2 pg   MCHC 30.6 (A) 31.8 - 35.4 g/dL   RDW, POC 13.8 %   Platelet Count, POC 247 142 - 424 K/uL   MPV 7.3 0 - 99.8 fL   Patient was made aware of these results before she left the office.       Assessment & Plan:   1. Flank pain -Keep appointment with urologist and do not forget to call for ultrasound that you normally get before you see him - POCT UA - Microscopic Only - POCT urinalysis dipstick - POCT CBC - BMP8+EGFR - Urine culture  2. Dizziness and giddiness -This resembles some in her ear and the patient will take meclizine 12.5 mg 3 times a day for 7 days  3. Nephrolithiasis -Follow-up with urology  4. UTI (urinary tract infection) with pyuria -Await treatment  pending results of urine culture -Make sure the patient takes a copy of the report and blood work with her to see the urologist  5. Right sided abdominal pain -Patient plans to see the urologist and he uses schedules ultrasounds.  6. Labyrinthitis, bilateral -Take meclizine 3 times daily with food for 7 days -Drink plenty of fluids  Meds ordered this encounter  Medications  . meclizine (ANTIVERT) 12.5 MG tablet    Sig: Take 1 tablet (12.5 mg total) by mouth 3 (three) times daily as needed for dizziness.    Dispense:  30 tablet    Refill:  1   Patient Instructions  Complete the stool card / FOBT We will call you about labs and urine Continue to drink plenty of fluids We will not do any treatment for urinary tract infection until culture and sensitivity comes back Please make sure you take a copy of all of your lab work when you go see the urologist   Arrie Senate MD

## 2014-03-23 NOTE — Patient Instructions (Addendum)
Complete the stool card / FOBT We will call you about labs and urine Continue to drink plenty of fluids We will not do any treatment for urinary tract infection until culture and sensitivity comes back Please make sure you take a copy of all of your lab work when you go see the urologist

## 2014-03-24 LAB — BMP8+EGFR
BUN/Creatinine Ratio: 19 (ref 11–26)
BUN: 20 mg/dL (ref 8–27)
CHLORIDE: 104 mmol/L (ref 97–108)
CO2: 22 mmol/L (ref 18–29)
Calcium: 8.9 mg/dL (ref 8.7–10.3)
Creatinine, Ser: 1.05 mg/dL — ABNORMAL HIGH (ref 0.57–1.00)
GFR calc Af Amer: 58 mL/min/{1.73_m2} — ABNORMAL LOW (ref >59)
GFR calc non Af Amer: 50 mL/min/{1.73_m2} — ABNORMAL LOW (ref >59)
GLUCOSE: 89 mg/dL (ref 65–99)
Potassium: 4.3 mmol/L (ref 3.5–5.2)
Sodium: 140 mmol/L (ref 134–144)

## 2014-03-25 DIAGNOSIS — M545 Low back pain: Secondary | ICD-10-CM | POA: Diagnosis not present

## 2014-03-25 DIAGNOSIS — N200 Calculus of kidney: Secondary | ICD-10-CM | POA: Diagnosis not present

## 2014-03-25 DIAGNOSIS — N390 Urinary tract infection, site not specified: Secondary | ICD-10-CM | POA: Diagnosis not present

## 2014-03-25 DIAGNOSIS — N261 Atrophy of kidney (terminal): Secondary | ICD-10-CM | POA: Diagnosis not present

## 2014-03-25 DIAGNOSIS — K6389 Other specified diseases of intestine: Secondary | ICD-10-CM | POA: Diagnosis not present

## 2014-03-25 LAB — URINE CULTURE

## 2014-03-27 ENCOUNTER — Other Ambulatory Visit: Payer: Medicare Other

## 2014-03-27 DIAGNOSIS — Z1212 Encounter for screening for malignant neoplasm of rectum: Secondary | ICD-10-CM | POA: Diagnosis not present

## 2014-03-27 NOTE — Progress Notes (Signed)
Lab only 

## 2014-03-29 LAB — FECAL OCCULT BLOOD, IMMUNOCHEMICAL: Fecal Occult Bld: POSITIVE — AB

## 2014-03-30 ENCOUNTER — Telehealth: Payer: Self-pay | Admitting: *Deleted

## 2014-03-30 NOTE — Telephone Encounter (Signed)
Patient aware of positive stool cards.  She will return to our office for CBC and another set of hemmo cards.

## 2014-03-30 NOTE — Telephone Encounter (Signed)
-----   Message from Chipper Herb, MD sent at 03/29/2014  7:30 PM EDT ----- FOBT is positive, please recheck another FOBT and get a CBC

## 2014-03-31 ENCOUNTER — Other Ambulatory Visit (INDEPENDENT_AMBULATORY_CARE_PROVIDER_SITE_OTHER): Payer: Medicare Other

## 2014-03-31 DIAGNOSIS — K921 Melena: Secondary | ICD-10-CM

## 2014-03-31 LAB — POCT CBC
GRANULOCYTE PERCENT: 73.4 % (ref 37–80)
HCT, POC: 41.3 % (ref 37.7–47.9)
Hemoglobin: 12.9 g/dL (ref 12.2–16.2)
LYMPH, POC: 1.6 (ref 0.6–3.4)
MCH, POC: 29.2 pg (ref 27–31.2)
MCHC: 31.1 g/dL — AB (ref 31.8–35.4)
MCV: 93.9 fL (ref 80–97)
MPV: 7 fL (ref 0–99.8)
PLATELET COUNT, POC: 288 10*3/uL (ref 142–424)
POC Granulocyte: 6.2 (ref 2–6.9)
POC LYMPH PERCENT: 18.4 %L (ref 10–50)
RBC: 4.4 M/uL (ref 4.04–5.48)
RDW, POC: 13.7 %
WBC: 8.5 10*3/uL (ref 4.6–10.2)

## 2014-03-31 NOTE — Progress Notes (Signed)
LAB ONLY 

## 2014-04-02 ENCOUNTER — Other Ambulatory Visit: Payer: Medicare Other

## 2014-04-02 DIAGNOSIS — Z1212 Encounter for screening for malignant neoplasm of rectum: Secondary | ICD-10-CM

## 2014-04-02 NOTE — Progress Notes (Signed)
Lab only 

## 2014-04-03 ENCOUNTER — Telehealth: Payer: Self-pay | Admitting: Family Medicine

## 2014-04-03 NOTE — Telephone Encounter (Signed)
Pt just turned in FOBT yesterday, informed her to give [place] till Monday afternoon at least for results to return to [place]

## 2014-04-04 LAB — FECAL OCCULT BLOOD, IMMUNOCHEMICAL: Fecal Occult Bld: NEGATIVE

## 2014-04-06 ENCOUNTER — Telehealth: Payer: Self-pay | Admitting: Cardiovascular Disease

## 2014-04-06 NOTE — Telephone Encounter (Signed)
Pt needs prior authorization for Norpace CR .She will need this before her next refill

## 2014-04-06 NOTE — Telephone Encounter (Signed)
[facility] cannot disclose PA info as Rx is not ready for refill and they cannot run the Rx to get the info.   Provided pharmacy staff with our fax number and they will fax over PA info on March 26th.   Routed to B. Lassiter as Juluis Rainier

## 2014-04-21 NOTE — Telephone Encounter (Signed)
PA approval # [id number]  01/21/2014 through 04/21/2015.  Pharmacy and patient notified.

## 2014-04-21 NOTE — Telephone Encounter (Signed)
Requested PA form yesterday and today.  Did not receive fax yesterday.  Requested to be transferred to try to get the authorization over the phone.

## 2014-04-23 DIAGNOSIS — H3532 Exudative age-related macular degeneration: Secondary | ICD-10-CM | POA: Diagnosis not present

## 2014-04-23 DIAGNOSIS — H35051 Retinal neovascularization, unspecified, right eye: Secondary | ICD-10-CM | POA: Diagnosis not present

## 2014-04-29 ENCOUNTER — Other Ambulatory Visit: Payer: Self-pay | Admitting: Cardiovascular Disease

## 2014-04-29 ENCOUNTER — Other Ambulatory Visit: Payer: Self-pay | Admitting: Family Medicine

## 2014-04-29 ENCOUNTER — Other Ambulatory Visit: Payer: Self-pay | Admitting: Cardiology

## 2014-05-01 ENCOUNTER — Telehealth: Payer: Self-pay | Admitting: *Deleted

## 2014-05-01 NOTE — Telephone Encounter (Signed)
PA approved for Norpace CR 01/21/14-04/21/15 by [employer].

## 2014-05-11 ENCOUNTER — Other Ambulatory Visit: Payer: Self-pay | Admitting: *Deleted

## 2014-05-11 MED ORDER — NORPACE CR 100 MG PO CP12: 100.0000 mg | ORAL_CAPSULE | Freq: Two times a day (BID) | ORAL | Status: DC

## 2014-05-11 MED ORDER — APIXABAN 2.5 MG PO TABS: 2.5000 mg | ORAL_TABLET | Freq: Two times a day (BID) | ORAL | Status: DC

## 2014-05-11 NOTE — Telephone Encounter (Signed)
Rx(s) sent to pharmacy electronically.  

## 2014-05-12 ENCOUNTER — Other Ambulatory Visit: Payer: Medicare Other

## 2014-05-12 ENCOUNTER — Other Ambulatory Visit: Payer: Self-pay

## 2014-05-12 DIAGNOSIS — Z1212 Encounter for screening for malignant neoplasm of rectum: Secondary | ICD-10-CM | POA: Diagnosis not present

## 2014-05-12 MED ORDER — LISINOPRIL 20 MG PO TABS: 20.0000 mg | ORAL_TABLET | Freq: Two times a day (BID) | ORAL | Status: DC

## 2014-05-12 MED ORDER — PAROXETINE HCL 20 MG PO TABS: ORAL_TABLET | ORAL | Status: DC

## 2014-05-12 NOTE — Progress Notes (Signed)
Lab only 

## 2014-05-13 LAB — FECAL OCCULT BLOOD, IMMUNOCHEMICAL: FECAL OCCULT BLD: NEGATIVE

## 2014-05-20 ENCOUNTER — Other Ambulatory Visit: Payer: Self-pay | Admitting: *Deleted

## 2014-05-20 MED ORDER — LISINOPRIL 10 MG PO TABS: 10.0000 mg | ORAL_TABLET | Freq: Two times a day (BID) | ORAL | Status: DC

## 2014-05-22 DIAGNOSIS — M25561 Pain in right knee: Secondary | ICD-10-CM | POA: Diagnosis not present

## 2014-05-22 DIAGNOSIS — M1711 Unilateral primary osteoarthritis, right knee: Secondary | ICD-10-CM | POA: Diagnosis not present

## 2014-05-26 ENCOUNTER — Ambulatory Visit (INDEPENDENT_AMBULATORY_CARE_PROVIDER_SITE_OTHER): Payer: Medicare Other | Admitting: Cardiovascular Disease

## 2014-05-26 ENCOUNTER — Encounter: Payer: Self-pay | Admitting: Cardiovascular Disease

## 2014-05-26 VITALS — BP 138/80 | HR 57 | Resp 16 | Ht 63.0 in | Wt 178.9 lb

## 2014-05-26 DIAGNOSIS — I2510 Atherosclerotic heart disease of native coronary artery without angina pectoris: Secondary | ICD-10-CM | POA: Diagnosis not present

## 2014-05-26 DIAGNOSIS — I4729 Other ventricular tachycardia: Secondary | ICD-10-CM

## 2014-05-26 DIAGNOSIS — I259 Chronic ischemic heart disease, unspecified: Secondary | ICD-10-CM | POA: Diagnosis not present

## 2014-05-26 DIAGNOSIS — I951 Orthostatic hypotension: Secondary | ICD-10-CM

## 2014-05-26 DIAGNOSIS — I472 Ventricular tachycardia: Secondary | ICD-10-CM

## 2014-05-26 DIAGNOSIS — R001 Bradycardia, unspecified: Secondary | ICD-10-CM

## 2014-05-26 DIAGNOSIS — Z95810 Presence of automatic (implantable) cardiac defibrillator: Secondary | ICD-10-CM

## 2014-05-26 DIAGNOSIS — Z4502 Encounter for adjustment and management of automatic implantable cardiac defibrillator: Secondary | ICD-10-CM

## 2014-05-26 DIAGNOSIS — I480 Paroxysmal atrial fibrillation: Secondary | ICD-10-CM

## 2014-05-26 LAB — CUP PACEART INCLINIC DEVICE CHECK
Battery Remaining Longevity: 12
Brady Statistic RV Percent Paced: 1 % — CL
Date Time Interrogation Session: 20160510111415
HighPow Impedance: 49 Ohm
Lead Channel Impedance Value: 849 Ohm
Lead Channel Pacing Threshold Pulse Width: 0.5 ms
Lead Channel Setting Pacing Pulse Width: 0.5 ms
MDC IDC MSMT LEADCHNL RV PACING THRESHOLD AMPLITUDE: 0.8 V
MDC IDC MSMT LEADCHNL RV SENSING INTR AMPL: 20 mV
MDC IDC SET LEADCHNL RV PACING AMPLITUDE: 2.2 V
Pulse Gen Serial Number: 191956
Zone Setting Detection Interval: 273 ms
Zone Setting Detection Interval: 363 ms
Zone Setting Detection Interval: 463 ms

## 2014-05-26 NOTE — Patient Instructions (Signed)
Remote monitoring is used to monitor your ICD from home. This monitoring reduces the number of office visits required to check your device to one time per year. It allows [place] to keep an eye on the functioning of your device to ensure it is working properly. You are scheduled for a device check from home on 08-24-2014. You may send your transmission at any time that day. If you have a wireless device, the transmission will be sent automatically. After your physician reviews your transmission, you will receive a postcard with your next transmission date.  Your physician recommends that you schedule a follow-up appointment in: 6 months with Dr.Croitoru

## 2014-05-27 ENCOUNTER — Encounter: Payer: Self-pay | Admitting: Cardiovascular Disease

## 2014-05-27 NOTE — Progress Notes (Signed)
Patient ID: [name], female   DOB: March 14, 1931, 81 y.o.   MRN: [id number]     Cardiology Office Note   Date:  05/27/2014   ID:  [name], DOB 05-10-1931, MRN [id number]  PCP:  Redge Gainer, MD  Cardiologist:   Sanda Klein, MD   Chief Complaint  Patient presents with  . Follow-up    3 months:  No complaints of chest pain, SOB or edema.  Occas. lightheadedness.      History of Present Illness: [name] is a 81 y.o. female who presents for Paroxysmal atrial fibrillation,  ICD check, history of cathecholaminergic polymorphic VT  She feels better after the ICD generator changeout, when we also backed down on her disopyramide dose. Her [employer] ICD (generator implanted in January 2016) shows no atrial fibrillation or VT/VF since surgery. There is <1^ V pacing and longevity is estimated at 12 years. She still has occasional orthostatic dizziness.  [name] has a history of adrenergically mediated polymorphic VT and received her first implantable defibrillator in 1993. She has been treated with disopyramide for a long time and has not had any recent defibrillator discharges.In 2009 she received a new endocardial defibrillator lead. Much later she developed coronary disease and received stents to the ramus intermedius artery and LAD artery. There has been no evidence of disease progression by subsequent cardiac catheterizations in 2006 & 2013 & late 2015.She has normal left ventricular systolic function and no overt heart failure. Most recent assessment of LVEF was in December 2015 when echo, nuclear perfusion study both showed EF of 55-60 percent. She had atrial fibrillation around the time of her initial cardiac arrest in 1993 and again during a hospitalization for acute pyelonephritis-related sepsis in September 2015. She has evidence of rate related left bundle branch block during atrial fibrillation with rapid ventricular response. During sinus rhythm  she has a nonspecific intraventricular conduction delay with a QRS duration of around 129 ms and a long PR interval. During atrial fibrillation she had frank left bundle branch block with a QRS duration of 157 ms. She is currently receiving anticoagulation with apixaban, so far without bleeding complications. She does not have a history of stroke or TIA.  Past Medical History  Diagnosis Date  . ICD (implantable cardiac defibrillator) in place     a. s/p initial ICD in 1993 in setting of cardiac arrest;  b. 01/2007 gen change: Guidant T135 Vitality DS VR single lead ICD.  Marland Kitchen Hypertension   . GERD (gastroesophageal reflux disease)     omeprazole  . Diverticulosis of colon (without mention of hemorrhage) 2007    Colonoscopy   . CAD (coronary artery disease)     a. history of cardiac arrest 1993;  b. s/p LAD/LCX stenting in 2003;  c. 07/2011 Cath: LM nl, LAD patent stent, D1 80ost, LCX patent stent, RCA min irregs;  d. 04/2012 MV: EF 66%, no ishcemia;  e. 12/2013 Echo: Ef 55%, no rwma, Gr 1 DD, triv AI/MR, mildly dil LA;  f. 12/2013 Lexi MV: intermediate risk - apical ischemia and inf/infsept fixed defect, ? artifactual.  . H/O hiatal hernia   . Orthostatic hypotension   . Chronic sinus bradycardia   . CKD (chronic kidney disease), stage III   . History of DVT in the past, not on Coumadin now     left  leg  . HYPERCHOLESTEROLEMIA   . Nephrolithiasis, just saw Dr [name]   . EXTERNAL HEMORRHOIDS   .  ARTHRITIS   . Esophagitis, unspecified     a. 2012 EGD  . Unspecified gastritis and gastroduodenitis without mention of hemorrhage     a. 2003 EGD->not noted on 2012 EGD.  . Macular degeneration     gets injecton in eye every 5 weeks- last injection - 05/03/2013   . Arthritis   . Peripheral vascular disease     ???  . Hiatal hernia     a. 2012 EGD.  Marland Kitchen Esophageal stricture     a. 2012 s/p dil.  . Myocardial infarction 1993  . AICD (automatic cardioverter/defibrillator) present   . CAD in  native artery 12/31/2013    Previous stents to LAD and Ramus patent on cath today 12/31/13 also There is severe disease in the ostial first diagonal which is unchanged from most recent cardiac catheterization. The right coronary artery could not be engaged selectively but nonselective angiography showed no significant disease in the proximal and midsegment.       Past Surgical History  Procedure Laterality Date  . Cardiac defibrillator placement  02/05/2007    Guidant  . Hemorroidectomy  80's  . Hysteroscopy w/d&c  09/02/2010    Procedure: DILATATION AND CURETTAGE (D&C) /HYSTEROSCOPY;  Surgeon: Margarette Asal;  Location: [facility];  Service: Gynecology;  Laterality: N/A;  Dilation and Curettage with Hysteroscopy and Polypectomy  . Cardiac catheterization    . Coronary stents     . Cystoscopy with ureteroscopy Right 05/20/2013    Procedure: CYSTOSCOPY, RIGHT URETEROSCOPY STONE EXTRACTION, Insertion of right DOUBLE J STENT ;  Surgeon: Irine Seal, MD;  Location: [facility];  Service: Urology;  Laterality: Right;  . Holmium laser application Right [id number]    Procedure: HOLMIUM LASER APPLICATION;  Surgeon: Irine Seal, MD;  Location: [facility];  Service: Urology;  Laterality: Right;  . Cystoscopy with ureteroscopy and stent placement N/A 06/03/2013    Procedure: SECOND LOOK CYSTOSCOPY WITH URETEROSCOPY  HOLMIUM LASER LITHO AND STONE EXTRACTION Sammie Bench ;  Surgeon: Malka So, MD;  Location: [facility];  Service: Urology;  Laterality: N/A;  . Left heart catheterization with coronary angiogram N/A 07/28/2011    Procedure: LEFT HEART CATHETERIZATION WITH CORONARY ANGIOGRAM;  Surgeon: Lorretta Harp, MD;  Location: [facility];  Service: Cardiovascular;  Laterality: N/A;  . Cardiovascular stress test  12/30/2013    abnormal  . Cardiac catheterization  01/01/15    patent stents -there is severe disease in ostial 1st diag but without change.   . Left heart catheterization with coronary angiogram N/A 12/31/2013     Procedure: LEFT HEART CATHETERIZATION WITH CORONARY ANGIOGRAM;  Surgeon: Wellington Hampshire, MD;  Location: [facility];  Service: Cardiovascular;  Laterality: N/A;  . Implantable cardioverter defibrillator (icd) generator change N/A 02/10/2014    Procedure: ICD GENERATOR CHANGE;  Surgeon: Sanda Klein, MD;  Location: [facility];  Service: Cardiovascular;  Laterality: N/A;     Current Outpatient Prescriptions  Medication Sig Dispense Refill  . acetaminophen (TYLENOL) 325 MG tablet Take 650 mg by mouth every 6 (six) hours as needed for mild pain or moderate pain.     Marland Kitchen apixaban (ELIQUIS) 2.5 MG TABS tablet Take 1 tablet (2.5 mg total) by mouth 2 (two) times daily. 180 tablet 2  . aspirin EC 81 MG tablet Take 81 mg by mouth daily.    . Calcium Citrate-Vitamin D (CITRACAL + D PO) Take 1 tablet by mouth daily.    . carboxymethylcellulose (REFRESH PLUS) 0.5 % SOLN  Place 1 drop into both eyes 3 (three) times daily as needed (dry eyes).     . Cholecalciferol (VITAMIN D) 1000 UNITS capsule Take 2,000 Units by mouth daily.     . CRESTOR 40 MG tablet TAKE 1 TABLET DAILY 90 tablet 3  . lisinopril (PRINIVIL,ZESTRIL) 10 MG tablet Take 1 tablet (10 mg total) by mouth 2 (two) times daily. Patient takes doses 12 hours apart at 1000am and 2200pm 60 tablet 3  . meclizine (ANTIVERT) 12.5 MG tablet Take 1 tablet (12.5 mg total) by mouth 3 (three) times daily as needed for dizziness. 30 tablet 1  . metoprolol tartrate (LOPRESSOR) 25 MG tablet Take 1 tablet (25 mg total) by mouth 2 (two) times daily. Patient takes doses 12 hours apart at 1000am and 2200pm 180 tablet 1  . Multiple Vitamin (MULTIVITAMIN WITH MINERALS) TABS tablet Take 1 tablet by mouth daily.    . nitroGLYCERIN (NITROSTAT) 0.4 MG SL tablet Place 1 tablet (0.4 mg total) under the tongue every 5 (five) minutes as needed for chest pain. 25 tablet 4  . NORPACE CR 100 MG 12 hr capsule Take 1 capsule (100 mg total) by mouth 2 (two) times daily. 180 capsule 2    . pantoprazole (PROTONIX) 40 MG tablet Take 1 tablet (40 mg total) by mouth 2 (two) times daily. 60 tablet 3  . PARoxetine (PAXIL) 20 MG tablet Take 1 tablet (20 mg total) by mouth every morning. 90 tablet 0  . torsemide (DEMADEX) 20 MG tablet Take 20 mg by mouth daily as needed (edema).     No current facility-administered medications for this visit.    Allergies:   Adhesive; Actos; Contrast media; Latex; Lipitor; and Protonix    Social History:  The patient  reports that she has never smoked. She has never used smokeless tobacco. She reports that she does not drink alcohol or use illicit drugs.   Family History:  The patient's family history includes Other in her father and mother; Stroke in her mother.    ROS:  Please see the history of present illness.    Otherwise, review of systems positive for orthostatic dizziness.   All other systems are reviewed and negative.    PHYSICAL EXAM: VS:  BP 138/80 mmHg  Pulse 57  Resp 16  Ht 5\' 3"  (1.6 m)  Wt 178 lb 14.4 oz (81.149 kg)  BMI 31.70 kg/m2 , BMI Body mass index is 31.7 kg/(m^2).  General: Alert, oriented x3, no distress  Head: no evidence of trauma, PERRL, EOMI, no exophtalmos or lid lag, no myxedema, no xanthelasma; normal ears, nose and oropharynx  Neck: normal jugular venous pulsations and no hepatojugular reflux; brisk carotid pulses without delay and no carotid bruits  Chest: clear to auscultation, no signs of consolidation by percussion or palpation, normal fremitus, symmetrical and full respiratory excursions; and right subclavian scar is, rapidly healing scar at defibrillator site in the left subclavian area  Cardiovascular: normal position and quality of the apical impulse, regular rhythm, normal first and second heart sounds, no murmurs, rubs or gallops  Abdomen: no tenderness or distention, no masses by palpation, no abnormal pulsatility or arterial bruits, normal bowel sounds, no hepatosplenomegaly  Extremities:  no clubbing, cyanosis; no edema; bilateral mild stasis dermatitis;varicose veins more obvious on the right than on the left; 2+ radial, ulnar and brachial pulses bilaterally; 2+ right femoral, posterior tibial and dorsalis pedis pulses; 2+ left femoral, posterior tibial and dorsalis pedis pulses; no subclavian or femoral bruits  Neurological: grossly nonfocal  Psych: euthymic mood, full affect   EKG:  EKG is ordered today. The ekg ordered today demonstrates sinus bradycardia, mild 1st degree AV block, LAFB (QRS 120 ms)   Recent Labs: 02/05/2014: ALT 23; Platelets 248 03/23/2014: BUN 20; Creatinine 1.05*; Potassium 4.3; Sodium 140 03/31/2014: Hemoglobin 12.9    Lipid Panel    Component Value Date/Time   CHOL 145 02/05/2014 0902   CHOL 140 07/31/2012 1019   TRIG 137 02/05/2014 0902   TRIG 161* 07/31/2012 1019   TRIG 169* 09/02/2010 0629   HDL 57 02/05/2014 0902   HDL 43 07/31/2012 1019   HDL 50 09/02/2010 0629   CHOLHDL 2.8 09/02/2010 0629   VLDL 34 09/02/2010 0629   LDLCALC 65 09/09/2013 0841   LDLCALC 65 07/31/2012 1019   LDLCALC 58 09/02/2010 0629      Wt Readings from Last 3 Encounters:  05/26/14 178 lb 14.4 oz (81.149 kg)  03/23/14 178 lb (80.74 kg)  02/20/14 180 lb (81.647 kg)      ASSESSMENT AND PLAN:  Paroxysmal atrial fibrillation Infrequent. Continue anticoagulation. Likelihood of recurrence is high and CHADSVasc score is 5. Disopyramide helps reduce the prevalence of events. Continue beta blocker as well.  CAD, s/p LAD and Dx stenting in 2003- patent 2006 and 07/28/11. Residual 80% Dx1  No signs or symptoms of coronary insufficiency or heart failure at this time.   HYPERCHOLESTEROLEMIA  Generally she has excellent lipid profile with exception of borderline hypertriglyceridemia.   Automatic implantable cardioverter-defibrillator in situ  Normal device function. Excellent lead parameters. The device appropriately discriminated the supraventricular  mechanism for her tachycardia and the ventricular rate did not really approach therapy zones when in atrial fibrillation.   history of cathecholaminergic polymorphic VT None in years. Encouraged her to consider eliminating disopyramide altogether, as it gets riskier with CAD and advancing age, but she is reluctant to make changes. She is satisfied with her improved complaints after we lowered the dose. .   Current medicines are reviewed at length with the patient today.  The patient does not have concerns regarding medicines.  The following changes have been made:  no change  Labs/ tests ordered today include:  Orders Placed This Encounter  Procedures  . Implantable device check  . EKG 12-Lead   Patient Instructions  Remote monitoring is used to monitor your ICD from home. This monitoring reduces the number of office visits required to check your device to one time per year. It allows [place] to keep an eye on the functioning of your device to ensure it is working properly. You are scheduled for a device check from home on 08-24-2014. You may send your transmission at any time that day. If you have a wireless device, the transmission will be sent automatically. After your physician reviews your transmission, you will receive a postcard with your next transmission date.  Your physician recommends that you schedule a follow-up appointment in: 6 months with Dr.Neal Trulson     Mikael Spray, MD  05/27/2014 6:00 PM    Sanda Klein, MD, [facility] [phone] office [phone] pager

## 2014-05-29 ENCOUNTER — Other Ambulatory Visit: Payer: Medicare Other

## 2014-05-29 DIAGNOSIS — Z1212 Encounter for screening for malignant neoplasm of rectum: Secondary | ICD-10-CM | POA: Diagnosis not present

## 2014-05-29 NOTE — Progress Notes (Signed)
Lab only 

## 2014-05-31 LAB — FECAL OCCULT BLOOD, IMMUNOCHEMICAL: Fecal Occult Bld: NEGATIVE

## 2014-06-03 ENCOUNTER — Encounter: Payer: Self-pay | Admitting: Family Medicine

## 2014-06-03 ENCOUNTER — Ambulatory Visit (INDEPENDENT_AMBULATORY_CARE_PROVIDER_SITE_OTHER): Payer: Medicare Other | Admitting: Family Medicine

## 2014-06-03 VITALS — BP 142/69 | HR 54 | Temp 97.8°F | Ht 63.0 in | Wt 178.0 lb

## 2014-06-03 DIAGNOSIS — I480 Paroxysmal atrial fibrillation: Secondary | ICD-10-CM | POA: Diagnosis not present

## 2014-06-03 DIAGNOSIS — N183 Chronic kidney disease, stage 3 unspecified: Secondary | ICD-10-CM

## 2014-06-03 DIAGNOSIS — K219 Gastro-esophageal reflux disease without esophagitis: Secondary | ICD-10-CM | POA: Diagnosis not present

## 2014-06-03 DIAGNOSIS — I10 Essential (primary) hypertension: Secondary | ICD-10-CM | POA: Diagnosis not present

## 2014-06-03 DIAGNOSIS — E7800 Pure hypercholesterolemia, unspecified: Secondary | ICD-10-CM

## 2014-06-03 DIAGNOSIS — K921 Melena: Secondary | ICD-10-CM | POA: Diagnosis not present

## 2014-06-03 DIAGNOSIS — E559 Vitamin D deficiency, unspecified: Secondary | ICD-10-CM

## 2014-06-03 DIAGNOSIS — I2510 Atherosclerotic heart disease of native coronary artery without angina pectoris: Secondary | ICD-10-CM

## 2014-06-03 DIAGNOSIS — N189 Chronic kidney disease, unspecified: Secondary | ICD-10-CM

## 2014-06-03 LAB — POCT CBC
Granulocyte percent: 63.7 %G (ref 37–80)
HEMATOCRIT: 39 % (ref 37.7–47.9)
Hemoglobin: 12.1 g/dL — AB (ref 12.2–16.2)
Lymph, poc: 2.1 (ref 0.6–3.4)
MCH: 28.4 pg (ref 27–31.2)
MCHC: 31 g/dL — AB (ref 31.8–35.4)
MCV: 91.4 fL (ref 80–97)
MPV: 6.6 fL (ref 0–99.8)
POC Granulocyte: 5 (ref 2–6.9)
POC LYMPH PERCENT: 27 %L (ref 10–50)
Platelet Count, POC: 273 10*3/uL (ref 142–424)
RBC: 4.26 M/uL (ref 4.04–5.48)
RDW, POC: 14 %
WBC: 7.8 10*3/uL (ref 4.6–10.2)

## 2014-06-03 NOTE — Patient Instructions (Addendum)
Medicare Annual Wellness Visit  [facility] and the medical providers at [facility] strive to bring you the best medical care.  In doing so we not only want to address your current medical conditions and concerns but also to detect new conditions early and prevent illness, disease and health-related problems.    Medicare offers a yearly Wellness Visit which allows our clinical staff to assess your need for preventative services including immunizations, lifestyle education, counseling to decrease risk of preventable diseases and screening for fall risk and other medical concerns.    This visit is provided free of charge (no copay) for all Medicare recipients. The clinical pharmacists at [facility] have begun to conduct these Wellness Visits which will also include a thorough review of all your medications.    As you primary medical provider recommend that you make an appointment for your Annual Wellness Visit if you have not done so already this year.  You may set up this appointment before you leave today or you may call back [phone]) and schedule an appointment.  Please make sure when you call that you mention that you are scheduling your Annual Wellness Visit with the clinical pharmacist so that the appointment may be made for the proper length of time.     Continue current medications. Continue good therapeutic lifestyle changes which include good diet and exercise. Fall precautions discussed with patient. If an FOBT was given today- please return it to our front desk. If you are over 40 years old - you may need Prevnar 64 or the adult Pneumonia vaccine.  Flu Shots are still available at our office. If you still haven't had one please call to set up a nurse visit to get one.   After your visit with [place] today you will receive a survey in the mail or online from [employer] regarding your care with [place]. Please take a moment to  fill this out. Your feedback is very important to [place] as you can help [place] better understand your patient needs as well as improve your experience and satisfaction. WE CARE ABOUT YOU!!!   We will discuss the positive stool for blood problem with your cardiologist and make decisions about where we should go from here with the blood thinners that you're taking. We will talk to him after we get the CBC results back The stool today on the rectal exam was positive for blood You should continue regular follow-up with the cardiologist He should continue to take the pantoprazole twice daily Please change the time of taking the baby aspirin to after dinner

## 2014-06-03 NOTE — Progress Notes (Signed)
Subjective:    Patient ID: [name], female    DOB: 18-May-1931, 81 y.o.   MRN: [id number]  HPI Pt here for follow up and management of chronic medical problems which includes hypertension, hyperlipidemia, and Atrial fibrillation. She is taking medications regularly. Patient has had some dark stools recently and we will give her a fecal occult blood test card for her to return and get a CBC today. The patient today is pleasant and alert. She has been having the tarry bowel movements for about 6 days. She just saw the cardiologist and he was pleased with everything at that time. She is also has some epigastric discomfort and tenderness. As far as her heart is concerned that she seems to be doing better we'll with the change in defibrillator's. The cardiologist has to see her back again in 8 months. She denies shortness of breath or chest pain unless she is exerting herself more than usual. Otherwise there is no chest pain or shortness of breath. She's had no trouble swallowing but has had the epigastric discomfort. She is passing her water without problems.       Patient Active Problem List   Diagnosis Date Noted  . ICD (implantable cardioverter-defibrillator) battery depletion 01/20/2014  . Abnormal nuclear stress test, 12/30/13 12/31/2013  . CAD in native artery 12/31/2013  . Unstable angina, neg MI, cath stable.maybe GI 12/30/2013  . Paroxysmal atrial fibrillation, chads2 Vasc2 score of 5, on eliquis 10/16/2013  . Catecholaminergic polymorphic ventricular tachycardia 10/16/2013  . Ureteral stone with hydronephrosis 05/20/2013  . Metabolic syndrome 58/85/0277  . Hypotension, meds stopped this admission 07/29/2011  . Orthostatic hypotension 07/27/2011  . Chest pain, r/o cardiac, negative MI 07/25/2011  . Chronic sinus bradycardia 07/25/2011  . Automatic implantable cardioverter-defibrillator in situ 07/25/2011  . Chronic renal insufficiency, stage III (moderate), Scr 1.5 in the  past 07/25/2011    Class: Chronic  . History of DVT in the past, on eliquis now 07/25/2011    Class: History of  . Nephrolithiasis, just saw Dr [name] 07/25/2011    Class: History of  . DYSPHAGIA 03/24/2010  . Chronic ischemic heart disease 12/03/2007  . RECTAL BLEEDING 12/03/2007  . Diarrhea 12/03/2007  . EPIGASTRIC PAIN 12/03/2007  . Hyperlipidemia 12/02/2007    Class: History of  . EXTERNAL HEMORRHOIDS 12/02/2007    Class: History of  . ESOPHAGITIS 12/02/2007  . GASTRITIS 12/02/2007  . COLITIS 12/02/2007  . DIVERTICULOSIS, COLON 12/02/2007  . ARTHRITIS 12/02/2007    Class: Chronic   Outpatient Encounter Prescriptions as of 06/03/2014  Medication Sig  . acetaminophen (TYLENOL) 325 MG tablet Take 650 mg by mouth every 6 (six) hours as needed for mild pain or moderate pain.   Marland Kitchen apixaban (ELIQUIS) 2.5 MG TABS tablet Take 1 tablet (2.5 mg total) by mouth 2 (two) times daily.  Marland Kitchen aspirin EC 81 MG tablet Take 81 mg by mouth daily.  . Calcium Citrate-Vitamin D (CITRACAL + D PO) Take 1 tablet by mouth daily.  . carboxymethylcellulose (REFRESH PLUS) 0.5 % SOLN Place 1 drop into both eyes 3 (three) times daily as needed (dry eyes).   . Cholecalciferol (VITAMIN D) 1000 UNITS capsule Take 2,000 Units by mouth daily.   . CRESTOR 40 MG tablet TAKE 1 TABLET DAILY  . lisinopril (PRINIVIL,ZESTRIL) 10 MG tablet Take 1 tablet (10 mg total) by mouth 2 (two) times daily. Patient takes doses 12 hours apart at 1000am and 2200pm  . meclizine (ANTIVERT) 12.5 MG tablet  Take 1 tablet (12.5 mg total) by mouth 3 (three) times daily as needed for dizziness.  . metoprolol tartrate (LOPRESSOR) 25 MG tablet Take 1 tablet (25 mg total) by mouth 2 (two) times daily. Patient takes doses 12 hours apart at 1000am and 2200pm  . Multiple Vitamin (MULTIVITAMIN WITH MINERALS) TABS tablet Take 1 tablet by mouth daily.  . nitroGLYCERIN (NITROSTAT) 0.4 MG SL tablet Place 1 tablet (0.4 mg total) under the tongue every 5  (five) minutes as needed for chest pain.  . NORPACE CR 100 MG 12 hr capsule Take 1 capsule (100 mg total) by mouth 2 (two) times daily.  . pantoprazole (PROTONIX) 40 MG tablet Take 1 tablet (40 mg total) by mouth 2 (two) times daily.  Marland Kitchen PARoxetine (PAXIL) 20 MG tablet Take 1 tablet (20 mg total) by mouth every morning.  . torsemide (DEMADEX) 20 MG tablet Take 20 mg by mouth daily as needed (edema).   No facility-administered encounter medications on file as of 06/03/2014.      Review of Systems  Constitutional: Negative.   HENT: Negative.   Eyes: Negative.   Respiratory: Negative.   Cardiovascular: Negative.   Gastrointestinal: Positive for blood in stool (? -- dark stools are present) and rectal pain.  Endocrine: Negative.   Genitourinary: Negative.   Musculoskeletal: Negative.   Skin: Negative.   Allergic/Immunologic: Negative.   Neurological: Negative.   Hematological: Negative.   Psychiatric/Behavioral: Negative.        Objective:   Physical Exam  Constitutional: She is oriented to person, place, and time. She appears well-developed and well-nourished. No distress.  HENT:  Head: Normocephalic and atraumatic.  Right Ear: External ear normal.  Left Ear: External ear normal.  Nose: Nose normal.  Mouth/Throat: Oropharynx is clear and moist.  Eyes: Conjunctivae and EOM are normal. Pupils are equal, round, and reactive to light. Right eye exhibits no discharge. Left eye exhibits no discharge. No scleral icterus.  Neck: Normal range of motion. Neck supple. No thyromegaly present.  Neck without bruits or thyroid  Cardiovascular: Normal rate, regular rhythm, normal heart sounds and intact distal pulses.   No murmur heard. At 60/m  Pulmonary/Chest: Effort normal and breath sounds normal. No respiratory distress. She has no wheezes. She has no rales. She exhibits no tenderness.  Clear anteriorly and posteriorly  Abdominal: Soft. Bowel sounds are normal. She exhibits no mass.  There is tenderness. There is no rebound and no guarding.  Epigastric and slight suprapubic tenderness  Musculoskeletal: Normal range of motion. She exhibits no edema or tenderness.  Good mobility especially since injection with cortisone and right knee from orthopedist  Lymphadenopathy:    She has no cervical adenopathy.  Neurological: She is alert and oriented to person, place, and time. She has normal reflexes. No cranial nerve deficit.  Skin: Skin is warm and dry. No rash noted.  Psychiatric: She has a normal mood and affect. Her behavior is normal. Judgment and thought content normal.  Nursing note and vitals reviewed.  BP 142/69 mmHg  Pulse 54  Temp(Src) 97.8 F (36.6 C) (Oral)  Ht _0  (1.6 m)  Wt 178 lb (80.74 kg)  BMI 31.54 kg/m2        Assessment & Plan:  1. Hyperlipidemia -Continue current treatment pending results of lab work - POCT CBC - NMR, lipoprofile  2. ASCVD (arteriosclerotic cardiovascular disease) -Continue regular follow-up with cardiology - POCT CBC - BMP8+EGFR - Hepatic function panel  3. Vitamin D deficiency -Continue current  treatment pending results of lab work - POCT CBC - Vit D  25 hydroxy (rtn osteoporosis monitoring)  4. Chronic renal insufficiency, stage III (moderate), Scr 1.5 in the past -Continue to avoid anti-inflammatory medicines and we will watch this closely with the blood work - POCT CBC - BMP8+EGFR  5. Gastroesophageal reflux disease, esophagitis presence not specified -Continue the pantoprazole twice daily and return the FOBT card - POCT CBC - Hepatic function panel  6. Paroxysmal atrial fibrillation -The rhythm was regular today and continue follow-up with cardiology is recommended - POCT CBC  7. Essential hypertension -Blood pressure slightly elevated today and patient should continue to watch sodium intake and monitor blood pressures at home and bring readings to each visit - POCT CBC - BMP8+EGFR - Hepatic  function panel  8. Melena -The stool today was positive for blood and this may be secondary to the blood thinners that the patient is taking. If the CBC has a low hemoglobin we will have to most likely discontinue these and we will talk to the cardiologist first.  No orders of the defined types were placed in this encounter.   Patient Instructions                       Medicare Annual Wellness Visit  [facility] and the medical providers at [facility] strive to bring you the best medical care.  In doing so we not only want to address your current medical conditions and concerns but also to detect new conditions early and prevent illness, disease and health-related problems.    Medicare offers a yearly Wellness Visit which allows our clinical staff to assess your need for preventative services including immunizations, lifestyle education, counseling to decrease risk of preventable diseases and screening for fall risk and other medical concerns.    This visit is provided free of charge (no copay) for all Medicare recipients. The clinical pharmacists at [facility] have begun to conduct these Wellness Visits which will also include a thorough review of all your medications.    As you primary medical provider recommend that you make an appointment for your Annual Wellness Visit if you have not done so already this year.  You may set up this appointment before you leave today or you may call back ([phone]) and schedule an appointment.  Please make sure when you call that you mention that you are scheduling your Annual Wellness Visit with the clinical pharmacist so that the appointment may be made for the proper length of time.     Continue current medications. Continue good therapeutic lifestyle changes which include good diet and exercise. Fall precautions discussed with patient. If an FOBT was given today- please return it to our front desk. If you  are over 55 years old - you may need Prevnar 63 or the adult Pneumonia vaccine.  Flu Shots are still available at our office. If you still haven't had one please call to set up a nurse visit to get one.   After your visit with [place] today you will receive a survey in the mail or online from [employer] regarding your care with [place]. Please take a moment to fill this out. Your feedback is very important to [place] as you can help [place] better understand your patient needs as well as improve your experience and satisfaction. WE CARE ABOUT YOU!!!   We will discuss the positive stool for blood problem with your cardiologist  and make decisions about where we should go from here with the blood thinners that you're taking. We will talk to him after we get the CBC results back The stool today on the rectal exam was positive for blood You should continue regular follow-up with the cardiologist He should continue to take the pantoprazole twice daily Please change the time of taking the baby aspirin to after dinner   Arrie Senate MD

## 2014-06-04 ENCOUNTER — Telehealth: Payer: Self-pay | Admitting: *Deleted

## 2014-06-04 LAB — NMR, LIPOPROFILE
Cholesterol: 174 mg/dL (ref 100–199)
HDL CHOLESTEROL BY NMR: 77 mg/dL (ref >39)
HDL PARTICLE NUMBER: 42.3 umol/L (ref >=30.5)
LDL Particle Number: 925 nmol/L (ref <1000)
LDL SIZE: 21 nm (ref >20.5)
LDL-C: 77 mg/dL (ref 0–99)
LP-IR Score: 43 (ref <=45)
SMALL LDL PARTICLE NUMBER: 354 nmol/L (ref <=527)
Triglycerides by NMR: 100 mg/dL (ref 0–149)

## 2014-06-04 LAB — BMP8+EGFR
BUN/Creatinine Ratio: 16 (ref 11–26)
BUN: 19 mg/dL (ref 8–27)
CHLORIDE: 102 mmol/L (ref 97–108)
CO2: 25 mmol/L (ref 18–29)
Calcium: 9.5 mg/dL (ref 8.7–10.3)
Creatinine, Ser: 1.2 mg/dL — ABNORMAL HIGH (ref 0.57–1.00)
GFR calc Af Amer: 49 mL/min/{1.73_m2} — ABNORMAL LOW (ref >59)
GFR calc non Af Amer: 43 mL/min/{1.73_m2} — ABNORMAL LOW (ref >59)
GLUCOSE: 113 mg/dL — AB (ref 65–99)
POTASSIUM: 4.7 mmol/L (ref 3.5–5.2)
Sodium: 142 mmol/L (ref 134–144)

## 2014-06-04 LAB — HEPATIC FUNCTION PANEL
ALT: 18 IU/L (ref 0–32)
AST: 16 IU/L (ref 0–40)
Albumin: 4 g/dL (ref 3.5–4.7)
Alkaline Phosphatase: 80 IU/L (ref 39–117)
Bilirubin Total: 0.7 mg/dL (ref 0.0–1.2)
Bilirubin, Direct: 0.16 mg/dL (ref 0.00–0.40)
Total Protein: 6.7 g/dL (ref 6.0–8.5)

## 2014-06-04 LAB — VITAMIN D 25 HYDROXY (VIT D DEFICIENCY, FRACTURES): Vit D, 25-Hydroxy: 28.8 ng/mL — ABNORMAL LOW (ref 30.0–100.0)

## 2014-06-04 NOTE — Telephone Encounter (Signed)
-----   Message from Chipper Herb, MD sent at 06/04/2014  7:36 AM EDT ----- The blood sugars elevated at 113. The creatinine, the most important kidney function test remains elevated as it has been in the past. It is slightly higher than it was 2 months ago but lower than the time before that. We will continue to monitor this. The electrolytes including potassium are good. All liver function tests are within normal limits Cholesterol numbers with advanced lipid testing remain good and within normal limits. The LDL C is good at 77, the total LDL particle number is excellent at 925 and the triglycerides are good at 100.----The patient should continue with her Crestor and with as aggressive therapeutic lifestyle changes as possible The vitamin D level remains low. If the patient is still taking vitamin D she should continue with the current dose but we cannot increase this anymore because of the elevated creatinine.

## 2014-06-04 NOTE — Telephone Encounter (Signed)
Pt notified of results Verbalizes understanding 

## 2014-06-05 ENCOUNTER — Ambulatory Visit: Payer: Medicare Other | Admitting: Family Medicine

## 2014-06-09 ENCOUNTER — Other Ambulatory Visit (INDEPENDENT_AMBULATORY_CARE_PROVIDER_SITE_OTHER): Payer: Medicare Other

## 2014-06-09 DIAGNOSIS — R42 Dizziness and giddiness: Secondary | ICD-10-CM | POA: Diagnosis not present

## 2014-06-09 DIAGNOSIS — N390 Urinary tract infection, site not specified: Secondary | ICD-10-CM | POA: Diagnosis not present

## 2014-06-09 DIAGNOSIS — Z1212 Encounter for screening for malignant neoplasm of rectum: Secondary | ICD-10-CM | POA: Diagnosis not present

## 2014-06-09 DIAGNOSIS — K921 Melena: Secondary | ICD-10-CM

## 2014-06-09 LAB — POCT CBC
GRANULOCYTE PERCENT: 62 % (ref 37–80)
HCT, POC: 42.2 % (ref 37.7–47.9)
Hemoglobin: 12.8 g/dL (ref 12.2–16.2)
Lymph, poc: 2 (ref 0.6–3.4)
MCH: 27.5 pg (ref 27–31.2)
MCHC: 30.3 g/dL — AB (ref 31.8–35.4)
MCV: 90.9 fL (ref 80–97)
MPV: 7 fL (ref 0–99.8)
POC Granulocyte: 4.4 (ref 2–6.9)
POC LYMPH PERCENT: 28.7 %L (ref 10–50)
Platelet Count, POC: 268 10*3/uL (ref 142–424)
RBC: 4.64 M/uL (ref 4.04–5.48)
RDW, POC: 14.9 %
WBC: 7.1 10*3/uL (ref 4.6–10.2)

## 2014-06-09 LAB — POCT URINALYSIS DIPSTICK
BILIRUBIN UA: NEGATIVE
Glucose, UA: NEGATIVE
KETONES UA: NEGATIVE
Nitrite, UA: NEGATIVE
Spec Grav, UA: 1.01
UROBILINOGEN UA: NEGATIVE
pH, UA: 6.5

## 2014-06-09 LAB — POCT UA - MICROSCOPIC ONLY
Bacteria, U Microscopic: NEGATIVE
CASTS, UR, LPF, POC: NEGATIVE
Crystals, Ur, HPF, POC: NEGATIVE
Yeast, UA: NEGATIVE

## 2014-06-09 NOTE — Addendum Note (Signed)
Addended by: Selmer Dominion on: 06/09/2014 04:48 PM   Modules accepted: Orders

## 2014-06-09 NOTE — Addendum Note (Signed)
Addended by: Zannie Cove on: 06/09/2014 11:25 AM   Modules accepted: Orders

## 2014-06-09 NOTE — Progress Notes (Signed)
Lab only 

## 2014-06-10 LAB — FECAL OCCULT BLOOD, IMMUNOCHEMICAL: Fecal Occult Bld: NEGATIVE

## 2014-06-11 ENCOUNTER — Telehealth: Payer: Self-pay | Admitting: *Deleted

## 2014-06-11 DIAGNOSIS — H3532 Exudative age-related macular degeneration: Secondary | ICD-10-CM | POA: Diagnosis not present

## 2014-06-11 LAB — URINE CULTURE

## 2014-06-11 NOTE — Telephone Encounter (Signed)
-----   Message from Chipper Herb, MD sent at 06/11/2014  7:30 AM EDT ----- There is mixed bacterial growth on the urine culture with no Specific organism being prominent.------ if the patient is having symptoms with her bladder which I do not think she was initially, we should repeat the urine and make sure it is a clean catch midstream specimen. If she is not having symptoms, there are no further treatment recommendations

## 2014-06-11 NOTE — Telephone Encounter (Signed)
Patient has normal results on urine and stool specimens. Follow up for any new symptoms.

## 2014-06-23 ENCOUNTER — Other Ambulatory Visit (INDEPENDENT_AMBULATORY_CARE_PROVIDER_SITE_OTHER): Payer: Medicare Other

## 2014-06-23 ENCOUNTER — Telehealth: Payer: Self-pay | Admitting: Family Medicine

## 2014-06-23 DIAGNOSIS — K921 Melena: Secondary | ICD-10-CM

## 2014-06-23 DIAGNOSIS — R5383 Other fatigue: Secondary | ICD-10-CM

## 2014-06-23 DIAGNOSIS — R531 Weakness: Secondary | ICD-10-CM

## 2014-06-23 LAB — POCT CBC
GRANULOCYTE PERCENT: 58 % (ref 37–80)
HCT, POC: 38.8 % (ref 37.7–47.9)
Hemoglobin: 12 g/dL — AB (ref 12.2–16.2)
Lymph, poc: 2.2 (ref 0.6–3.4)
MCH: 28 pg (ref 27–31.2)
MCHC: 31 g/dL — AB (ref 31.8–35.4)
MCV: 90.5 fL (ref 80–97)
MPV: 6.9 fL (ref 0–99.8)
POC Granulocyte: 4 (ref 2–6.9)
POC LYMPH %: 32.3 % (ref 10–50)
Platelet Count, POC: 271 10*3/uL (ref 142–424)
RBC: 4.29 M/uL (ref 4.04–5.48)
RDW, POC: 15.3 %
WBC: 6.9 10*3/uL (ref 4.6–10.2)

## 2014-06-23 NOTE — Telephone Encounter (Signed)
Call patient and find out the details

## 2014-06-23 NOTE — Telephone Encounter (Signed)
Pt feels like colonoscopy is needed, [facility] said refer back to GI as well.  Was a Dr Sharlett Iles pt --- will refer to Dr Hilarie Fredrickson. - urgently Pt feels bad and weak. She knows to call 911 or go to ER if needed.

## 2014-06-24 ENCOUNTER — Encounter: Payer: Self-pay | Admitting: Cardiovascular Disease

## 2014-06-24 ENCOUNTER — Ambulatory Visit: Payer: Medicare Other | Admitting: Internal Medicine

## 2014-06-30 ENCOUNTER — Ambulatory Visit (INDEPENDENT_AMBULATORY_CARE_PROVIDER_SITE_OTHER): Payer: Medicare Other | Admitting: Internal Medicine

## 2014-06-30 ENCOUNTER — Encounter: Payer: Self-pay | Admitting: Internal Medicine

## 2014-06-30 ENCOUNTER — Telehealth: Payer: Self-pay | Admitting: Internal Medicine

## 2014-06-30 ENCOUNTER — Other Ambulatory Visit (INDEPENDENT_AMBULATORY_CARE_PROVIDER_SITE_OTHER): Payer: Medicare Other

## 2014-06-30 VITALS — BP 132/62 | HR 64 | Ht 63.0 in | Wt 177.1 lb

## 2014-06-30 DIAGNOSIS — Z7901 Long term (current) use of anticoagulants: Secondary | ICD-10-CM | POA: Diagnosis not present

## 2014-06-30 DIAGNOSIS — D6489 Other specified anemias: Secondary | ICD-10-CM | POA: Diagnosis not present

## 2014-06-30 DIAGNOSIS — K2100 Gastro-esophageal reflux disease with esophagitis, without bleeding: Secondary | ICD-10-CM

## 2014-06-30 DIAGNOSIS — R195 Other fecal abnormalities: Secondary | ICD-10-CM | POA: Diagnosis not present

## 2014-06-30 DIAGNOSIS — I2510 Atherosclerotic heart disease of native coronary artery without angina pectoris: Secondary | ICD-10-CM

## 2014-06-30 DIAGNOSIS — R1084 Generalized abdominal pain: Secondary | ICD-10-CM

## 2014-06-30 LAB — CBC WITH DIFFERENTIAL/PLATELET
BASOS PCT: 0.8 % (ref 0.0–3.0)
Basophils Absolute: 0 10*3/uL (ref 0.0–0.1)
Eosinophils Absolute: 0.3 10*3/uL (ref 0.0–0.7)
Eosinophils Relative: 4.8 % (ref 0.0–5.0)
HCT: 38.8 % (ref 36.0–46.0)
Hemoglobin: 12.6 g/dL (ref 12.0–15.0)
LYMPHS PCT: 30 % (ref 12.0–46.0)
Lymphs Abs: 1.9 10*3/uL (ref 0.7–4.0)
MCHC: 32.5 g/dL (ref 30.0–36.0)
MCV: 90.6 fl (ref 78.0–100.0)
Monocytes Absolute: 0.7 10*3/uL (ref 0.1–1.0)
Monocytes Relative: 11.9 % (ref 3.0–12.0)
Neutro Abs: 3.3 10*3/uL (ref 1.4–7.7)
Neutrophils Relative %: 52.5 % (ref 43.0–77.0)
PLATELETS: 266 10*3/uL (ref 150.0–400.0)
RBC: 4.28 Mil/uL (ref 3.87–5.11)
RDW: 15.1 % (ref 11.5–15.5)
WBC: 6.3 10*3/uL (ref 4.0–10.5)

## 2014-06-30 MED ORDER — IRON 325 (65 FE) MG PO TABS: 325.0000 | ORAL_TABLET | Freq: Every day | ORAL | Status: DC

## 2014-06-30 NOTE — Progress Notes (Signed)
HISTORY OF PRESENT ILLNESS:  [name] is a 81 y.o. female with multiple significant medical problems including hypertension, coronary artery disease with coronary artery stent placement, history of cardiac arrest status post implantable cardiac defibrillator placement, recurrent DVT, chronic anticoagulation therapy in the form of Eliquis, arthritis, GERD complicated by peptic stricture requiring esophageal dilation, and chronic diarrhea secondary to microscopic colitis. Long-standing patient of Dr. Verl Blalock. Most recent colonoscopy February 2012. This was normal. Random colon biopsies revealed microscopic colitis. Upper endoscopy at that same time revealed esophagitis and hiatal hernia. Esophageal stricture was dilated. She is sent today by her primary care provider regarding abdominal pain, complaints of dark stools, and Hemoccult-positive stool. She is accompanied by her daughter-in-law. GI review of systems is essentially positive except for liver disease and weight change. Patient reports vague lower abdominal discomfort. She cannot identify any exacerbating or relieving factors. Problem has been going on for months. She also reports some intermittent dark stools as recently as yesterday. Also tells me that she has had blood in her urine. Hemoglobin March was normal at 12.9. Last hemoglobin one week ago was 12.0. Normal MCV. Comprehensive metabolic panel is unremarkable with creatinine 1.2. In addition to Eliquis she takes aspirin daily. For her reflux disease she takes pantoprazole 40 mg twice a day. Occasional reflux. No significant dysphagia.  REVIEW OF SYSTEMS:  All non-GI ROS negative except for sinus and allergy trouble, arthritis, back pain, fatigue, headaches, hearing problems, irregular heart rate, itching, muscle cramps, shortness of breath, skin rash, sleeping problems  Past Medical History  Diagnosis Date  . ICD (implantable cardiac defibrillator) in place     a. s/p initial  ICD in 1993 in setting of cardiac arrest;  b. 01/2007 gen change: Guidant T135 Vitality DS VR single lead ICD.  Marland Kitchen Hypertension   . GERD (gastroesophageal reflux disease)     omeprazole  . Diverticulosis of colon (without mention of hemorrhage) 2007    Colonoscopy   . CAD (coronary artery disease)     a. history of cardiac arrest 1993;  b. s/p LAD/LCX stenting in 2003;  c. 07/2011 Cath: LM nl, LAD patent stent, D1 80ost, LCX patent stent, RCA min irregs;  d. 04/2012 MV: EF 66%, no ishcemia;  e. 12/2013 Echo: Ef 55%, no rwma, Gr 1 DD, triv AI/MR, mildly dil LA;  f. 12/2013 Lexi MV: intermediate risk - apical ischemia and inf/infsept fixed defect, ? artifactual.  . H/O hiatal hernia   . Orthostatic hypotension   . Chronic sinus bradycardia   . CKD (chronic kidney disease), stage III   . History of DVT in the past, not on Coumadin now     left  leg  . HYPERCHOLESTEROLEMIA   . Nephrolithiasis, just saw Dr [name]   . EXTERNAL HEMORRHOIDS   . ARTHRITIS   . Esophagitis, unspecified     a. 2012 EGD  . Unspecified gastritis and gastroduodenitis without mention of hemorrhage     a. 2003 EGD->not noted on 2012 EGD.  . Macular degeneration     gets injecton in eye every 5 weeks- last injection - 05/03/2013   . Arthritis   . Peripheral vascular disease     ???  . Hiatal hernia     a. 2012 EGD.  Marland Kitchen Esophageal stricture     a. 2012 s/p dil.  . Myocardial infarction 1993  . AICD (automatic cardioverter/defibrillator) present   . CAD in native artery 12/31/2013    Previous stents to LAD  and Ramus patent on cath today 12/31/13 also There is severe disease in the ostial first diagonal which is unchanged from most recent cardiac catheterization. The right coronary artery could not be engaged selectively but nonselective angiography showed no significant disease in the proximal and midsegment.       Past Surgical History  Procedure Laterality Date  . Cardiac defibrillator placement  02/05/2007     Guidant  . Hemorroidectomy  80's  . Hysteroscopy w/d&c  09/02/2010    Procedure: DILATATION AND CURETTAGE (D&C) /HYSTEROSCOPY;  Surgeon: Margarette Asal;  Location: [facility];  Service: Gynecology;  Laterality: N/A;  Dilation and Curettage with Hysteroscopy and Polypectomy  . Cardiac catheterization    . Coronary stents     . Cystoscopy with ureteroscopy Right 05/20/2013    Procedure: CYSTOSCOPY, RIGHT URETEROSCOPY STONE EXTRACTION, Insertion of right DOUBLE J STENT ;  Surgeon: Irine Seal, MD;  Location: [facility];  Service: Urology;  Laterality: Right;  . Holmium laser application Right [id number]    Procedure: HOLMIUM LASER APPLICATION;  Surgeon: Irine Seal, MD;  Location: [facility];  Service: Urology;  Laterality: Right;  . Cystoscopy with ureteroscopy and stent placement N/A 06/03/2013    Procedure: SECOND LOOK CYSTOSCOPY WITH URETEROSCOPY  HOLMIUM LASER LITHO AND STONE EXTRACTION Sammie Bench ;  Surgeon: Malka So, MD;  Location: [facility];  Service: Urology;  Laterality: N/A;  . Left heart catheterization with coronary angiogram N/A 07/28/2011    Procedure: LEFT HEART CATHETERIZATION WITH CORONARY ANGIOGRAM;  Surgeon: Lorretta Harp, MD;  Location: [facility] C[facility];  Service: Cardiovascular;  Laterality: N/A;  . Cardiovascular stress test  12/30/2013    abnormal  . Cardiac catheterization  01/01/15    patent stents -there is severe disease in ostial 1st diag but without change.   . Left heart catheterization with coronary angiogram N/A 12/31/2013    Procedure: LEFT HEART CATHETERIZATION WITH CORONARY ANGIOGRAM;  Surgeon: Wellington Hampshire, MD;  Location: [facility]ATH [facility];  Service: Cardiovascular;  Laterality: N/A;  . Implantable cardioverter defibrillator (icd) generator change N/A 02/10/2014    Procedure: ICD GENERATOR CHANGE;  Surgeon: Sanda Klein, MD;  Location: [facility] CATH LAB;  Service: Cardiovascular;  Laterality: N/A;    Social History CHARA BIBY  reports that she has never smoked. She has never used  smokeless tobacco. She reports that she does not drink alcohol or use illicit drugs.  family history includes Colon cancer in her sister; Other in her father and mother; Stroke in her mother. There is no history of Colon polyps.  Allergies  Allergen Reactions  . Adhesive [Tape] Other (See Comments)    Causes sores  . Actos [Pioglitazone] Swelling  . Contrast Media [Iodinated Diagnostic Agents] Other (See Comments)    Headache (no action/pre-med required)  . Latex Rash  . Lipitor [Atorvastatin] Other (See Comments)    myalgia  . Protonix [Pantoprazole Sodium] Diarrhea       PHYSICAL EXAMINATION: Vital signs: BP 132/62 mmHg  Pulse 64  Ht 5\' 3"  (1.6 m)  Wt 177 lb 2 oz (80.343 kg)  BMI 31.38 kg/m2  Constitutional: Elderly, chronically ill-appearing elderly female, no acute distress. Obese Psychiatric: alert and oriented x3, cooperative Eyes: extraocular movements intact, anicteric, conjunctiva pink Mouth: oral pharynx moist, no lesions Neck: supple no lymphadenopathy Cardiovascular: heart regular rate and rhythm, no murmur Lungs: clear to auscultation bilaterally Abdomen: soft, obese, nontender, nondistended, no obvious ascites, no peritoneal signs, normal bowel sounds, no organomegaly Rectal: Soft brown  Hemoccult-negative stool without tenderness or mass Extremities: no lower extremity edema bilaterally Skin: no lesions on visible extremities Neuro: No focal deficits. No asterixis.   ASSESSMENT:  #1. Vague abdominal pain. Etiology unclear. Colonoscopy and upper endoscopy in 2012 as described #2. Hemoccult-positive stool. Question melena by history  Hemoccult-negative stool today. #3. GERD with a history of esophagitis and stricture. On twice a day PPI with minimal symptoms  #4. History of microscopic colitis. Noncompliant with Entocort therapy due to cost  #5. MULTIPLE SIGNIFICANT medical problems and on chronic anticoagulation therapy. High risk for diagnostic endoscopic  evaluations    PLAN:  #1. Repeat CBC today to assure that her hemoglobin has been stable #2. Continue PPI for GERD and to protect upper GI mucosal given chronic aspirin and Eliquis therapy #3. Schedule virtual colonoscopy to evaluate Hemoccult-positive stool in this high-risk patient. As well, the CT portion can evaluate her abdominal pain #4. May have mild GI mucosal oozing from aspirin and Eliquis. I recommended daily over-the-counter oral iron therapy indefinitely to protect against the development of chronic iron deficiency anemia #5. Ongoing general medical care with Dr. Laurance Flatten

## 2014-06-30 NOTE — Patient Instructions (Signed)
Your physician has requested that you go to the basement for the following lab work before leaving today:  CBC  As discussed with Dr. Henrene Pastor, please begin taking 325mg  of iron once a day.  This can be purchased over the counter.  We will call you after we have scheduled your virtual colonoscopy

## 2014-06-30 NOTE — Telephone Encounter (Signed)
Returned called.

## 2014-07-03 ENCOUNTER — Telehealth: Payer: Self-pay

## 2014-07-03 NOTE — Telephone Encounter (Signed)
Gave patient information about scheduled virtual colonoscopy:  07/10/2014 7:40am for an 8:00am appointment.  Patient is to pick prep up either today or Monday (07/06/2014).  Patient acknowledged and understood.

## 2014-07-10 ENCOUNTER — Ambulatory Visit
Admission: RE | Admit: 2014-07-10 | Discharge: 2014-07-10 | Disposition: A | Discharge status: Home / Self Care | Payer: Medicare Other | Source: Ambulatory Visit | Attending: Internal Medicine | Admitting: Internal Medicine

## 2014-07-10 DIAGNOSIS — R195 Other fecal abnormalities: Secondary | ICD-10-CM

## 2014-07-10 DIAGNOSIS — D649 Anemia, unspecified: Secondary | ICD-10-CM | POA: Diagnosis not present

## 2014-07-10 DIAGNOSIS — Z7901 Long term (current) use of anticoagulants: Secondary | ICD-10-CM

## 2014-07-10 DIAGNOSIS — D6489 Other specified anemias: Secondary | ICD-10-CM

## 2014-07-10 DIAGNOSIS — K921 Melena: Secondary | ICD-10-CM | POA: Diagnosis not present

## 2014-07-10 DIAGNOSIS — R1084 Generalized abdominal pain: Secondary | ICD-10-CM

## 2014-08-10 NOTE — Patient Outreach (Signed)
[facility]) Care Management  08/10/2014  LAZETTE LEMLEY June 21, 1931 [id number]   Referral from [place], Kandis Mannan, RN assigned to outreach.  Ronnell Freshwater. [place], [place] Management [place] Assistant Phone: [phone] Fax: [phone]

## 2014-08-13 ENCOUNTER — Other Ambulatory Visit: Payer: Self-pay | Admitting: *Deleted

## 2014-08-13 NOTE — Patient Outreach (Signed)
Call to patient, number given in chart.  Unable to reach, line busy. Plan to attempt again tomorrow. Royetta Crochet. Laymond Purser, RN, BSN, [place] [phone]

## 2014-08-21 ENCOUNTER — Other Ambulatory Visit: Payer: Self-pay | Admitting: *Deleted

## 2014-08-21 NOTE — Patient Outreach (Signed)
Call to patient for [facility] program screening. Patient did answer the phone but reports she is not feeling good and cannot talk today, She does report it would be  Ok to call back late next week as she has some appointments next week. Plan to call her back next Friday for screening. Royetta Crochet. Laymond Purser, RN, BSN, [place] [phone]

## 2014-08-24 ENCOUNTER — Other Ambulatory Visit: Payer: Self-pay | Admitting: Family Medicine

## 2014-08-25 ENCOUNTER — Ambulatory Visit (INDEPENDENT_AMBULATORY_CARE_PROVIDER_SITE_OTHER): Payer: Medicare Other | Admitting: *Deleted

## 2014-08-25 DIAGNOSIS — I429 Cardiomyopathy, unspecified: Secondary | ICD-10-CM | POA: Diagnosis not present

## 2014-08-25 NOTE — Progress Notes (Signed)
Remote ICD transmission.   

## 2014-08-28 ENCOUNTER — Other Ambulatory Visit: Payer: Self-pay | Admitting: *Deleted

## 2014-08-28 NOTE — Patient Outreach (Signed)
Call to patient for [facility] program screening Spoke with patient at length. Patient has had a defibrillator since 1994.  She has it replaced 3 times since then. Patient  She has [place] medicaid, but not regular Medicaid and has extra help with medications, she states she takes a very expensive medication-Norpace, she is getting assistance from the drug companies for this. Patient only question she had was about getting help in home if she was to need it in the future, RNCM dicsussed that Medicare does not provide custodial type care, there are private pay companies and full Medicaid has some programs. Patient reports she does not need any assistance at this time, just wanted toknow for reference.  Patient is already participating in a Case Management program telephonically with her supplemental insurance. Patient would appreciate information about [facility]. Will mail out packet and close screening. Royetta Crochet. Laymond Purser, RN, BSN, [place] [phone]

## 2014-08-31 DIAGNOSIS — H3562 Retinal hemorrhage, left eye: Secondary | ICD-10-CM | POA: Diagnosis not present

## 2014-08-31 DIAGNOSIS — H3532 Exudative age-related macular degeneration: Secondary | ICD-10-CM | POA: Diagnosis not present

## 2014-08-31 DIAGNOSIS — H35051 Retinal neovascularization, unspecified, right eye: Secondary | ICD-10-CM | POA: Diagnosis not present

## 2014-08-31 LAB — CUP PACEART REMOTE DEVICE CHECK
Battery Remaining Percentage: 100 %
Brady Statistic RV Percent Paced: 1 %
Date Time Interrogation Session: 20160809044200
HighPow Impedance: 47 Ohm
Lead Channel Impedance Value: 888 Ohm
Lead Channel Pacing Threshold Amplitude: 0.8 V
Lead Channel Setting Pacing Amplitude: 2.2 V
MDC IDC MSMT BATTERY REMAINING LONGEVITY: 144 mo
MDC IDC MSMT LEADCHNL RV PACING THRESHOLD PULSEWIDTH: 0.5 ms
MDC IDC SET LEADCHNL RV PACING PULSEWIDTH: 0.5 ms
MDC IDC SET LEADCHNL RV SENSING SENSITIVITY: 0.6 mV
Pulse Gen Serial Number: 191956
Zone Setting Detection Interval: 300 ms

## 2014-09-01 NOTE — Patient Outreach (Signed)
[facility]) Care Management  08/28/2014  [name] 04-22-1931 [id number]   Notification from Burgess Amor, RN to close case due to patient is active with another Case Management program.  Thanks, Ronnell Freshwater. [place], [place] Assistant Phone: [phone] Fax: [phone]

## 2014-09-04 ENCOUNTER — Encounter: Payer: Self-pay | Admitting: Family Medicine

## 2014-09-04 ENCOUNTER — Ambulatory Visit (INDEPENDENT_AMBULATORY_CARE_PROVIDER_SITE_OTHER): Payer: Medicare Other | Admitting: Family Medicine

## 2014-09-04 VITALS — BP 147/74 | HR 58 | Temp 97.4°F | Ht 63.0 in | Wt 180.0 lb

## 2014-09-04 DIAGNOSIS — E7800 Pure hypercholesterolemia, unspecified: Secondary | ICD-10-CM

## 2014-09-04 DIAGNOSIS — I480 Paroxysmal atrial fibrillation: Secondary | ICD-10-CM | POA: Diagnosis not present

## 2014-09-04 DIAGNOSIS — R739 Hyperglycemia, unspecified: Secondary | ICD-10-CM | POA: Diagnosis not present

## 2014-09-04 DIAGNOSIS — N183 Chronic kidney disease, stage 3 unspecified: Secondary | ICD-10-CM

## 2014-09-04 DIAGNOSIS — K219 Gastro-esophageal reflux disease without esophagitis: Secondary | ICD-10-CM | POA: Diagnosis not present

## 2014-09-04 DIAGNOSIS — I2510 Atherosclerotic heart disease of native coronary artery without angina pectoris: Secondary | ICD-10-CM | POA: Diagnosis not present

## 2014-09-04 DIAGNOSIS — R300 Dysuria: Secondary | ICD-10-CM

## 2014-09-04 DIAGNOSIS — E559 Vitamin D deficiency, unspecified: Secondary | ICD-10-CM | POA: Diagnosis not present

## 2014-09-04 DIAGNOSIS — Z1212 Encounter for screening for malignant neoplasm of rectum: Secondary | ICD-10-CM

## 2014-09-04 DIAGNOSIS — N2889 Other specified disorders of kidney and ureter: Secondary | ICD-10-CM

## 2014-09-04 DIAGNOSIS — N3000 Acute cystitis without hematuria: Secondary | ICD-10-CM | POA: Diagnosis not present

## 2014-09-04 DIAGNOSIS — R5383 Other fatigue: Secondary | ICD-10-CM | POA: Diagnosis not present

## 2014-09-04 DIAGNOSIS — N189 Chronic kidney disease, unspecified: Secondary | ICD-10-CM

## 2014-09-04 LAB — POCT URINALYSIS DIPSTICK
Bilirubin, UA: NEGATIVE
Glucose, UA: NEGATIVE
Ketones, UA: NEGATIVE
NITRITE UA: NEGATIVE
PH UA: 7.5
PROTEIN UA: NEGATIVE
RBC UA: NEGATIVE
Spec Grav, UA: 1.01
UROBILINOGEN UA: NEGATIVE

## 2014-09-04 LAB — POCT UA - MICROSCOPIC ONLY
Bacteria, U Microscopic: NEGATIVE
Casts, Ur, LPF, POC: NEGATIVE
Crystals, Ur, HPF, POC: NEGATIVE
Mucus, UA: NEGATIVE
RBC, urine, microscopic: NEGATIVE
Yeast, UA: NEGATIVE

## 2014-09-04 MED ORDER — SULFAMETHOXAZOLE-TRIMETHOPRIM 800-160 MG PO TABS: 1.0000 | ORAL_TABLET | Freq: Two times a day (BID) | ORAL | Status: DC

## 2014-09-04 NOTE — Patient Instructions (Addendum)
Medicare Annual Wellness Visit  [place] and the medical providers at [facility] strive to bring you the best medical care.  In doing so we not only want to address your current medical conditions and concerns but also to detect new conditions early and prevent illness, disease and health-related problems.    Medicare offers a yearly Wellness Visit which allows our clinical staff to assess your need for preventative services including immunizations, lifestyle education, counseling to decrease risk of preventable diseases and screening for fall risk and other medical concerns.    This visit is provided free of charge (no copay) for all Medicare recipients. The clinical pharmacists at [facility] have begun to conduct these Wellness Visits which will also include a thorough review of all your medications.    As you primary medical provider recommend that you make an appointment for your Annual Wellness Visit if you have not done so already this year.  You may set up this appointment before you leave today or you may call back [phone] and schedule an appointment.  Please make sure when you call that you mention that you are scheduling your Annual Wellness Visit with the clinical pharmacist so that the appointment may be made for the proper length of time.     Continue current medications. Continue good therapeutic lifestyle changes which include good diet and exercise. Fall precautions discussed with patient. If an FOBT was given today- please return it to our front desk. If you are over 80 years old - you may need Prevnar 19 or the adult Pneumonia vaccine.  **Flu shots will be available soon--- please call and schedule a FLU-CLINIC appointment**  After your visit with [place] today you will receive a survey in the mail or online from [employer] regarding your care with [place]. Please take a moment to fill this out. Your feedback is  very important to [place] as you can help [place] better understand your patient needs as well as improve your experience and satisfaction. WE CARE ABOUT YOU!!!   **Please join [place] SEPT.22, 2016 from 5:00 to 7:00pm for our OPEN HOUSE! Come out and meet our NEW providers**  The patient should take the iron that was prescribed by the gastroenterologist. She should drink plenty of water and fluids daily. She should take the antibiotic that we are prescribing for the urinary tract infection at least until the culture and sensitivity is returned and we may have to change the antibiotic at that time. Follow-up with cardiology as planned follow-up with urology as planned Follow-up with GI as needed

## 2014-09-04 NOTE — Progress Notes (Signed)
Subjective:    Patient ID: [name], female    DOB: [birth year], 81 y.o.   MRN: [id number]  HPI Pt here for follow up and management of chronic medical problems which includes hypertension and hyperlipidemia. She is taking medications regularly. This patient is followed by cardiology regularly because of her coronary artery disease atrial fibrillation and pacemaker. She is also seen periodically by the urologist. Today she does complain of some dysuria. The dysuria has been going on for about a week and she has been drinking more fluids. She is seen the urologist in the past because of kidney stones. She continues to see the cardiologist every 3 months. She has seen the gastroenterologist and had a virtual colonoscopy and according to her and the gastroenterologist everything was okay on this. She denies chest pain but does have occasional palpitations. She also complains of just no energy and this is especially been worse since she had the pacemaker implantation in February of this year. She denies any problems with her GI tract at the present time. She is requesting a handicap license tag and we will make sure this gets taken care of before she leaves office.     Patient Active Problem List   Diagnosis Date Noted  . ICD (implantable cardioverter-defibrillator) battery depletion 01/20/2014  . Abnormal nuclear stress test, 12/30/13 12/31/2013  . CAD in native artery 12/31/2013  . Unstable angina, neg MI, cath stable.maybe GI 12/30/2013  . Paroxysmal atrial fibrillation, chads2 Vasc2 score of 5, on eliquis 10/16/2013  . Catecholaminergic polymorphic ventricular tachycardia 10/16/2013  . Ureteral stone with hydronephrosis 05/20/2013  . Metabolic syndrome 65/78/4696  . Hypotension, meds stopped this admission 07/29/2011  . Orthostatic hypotension 07/27/2011  . Chest pain, r/o cardiac, negative MI 07/25/2011  . Chronic sinus bradycardia 07/25/2011  . Automatic implantable  cardioverter-defibrillator in situ 07/25/2011  . Chronic renal insufficiency, stage III (moderate), Scr 1.5 in the past 07/25/2011    Class: Chronic  . History of DVT in the past, on eliquis now 07/25/2011    Class: History of  . Nephrolithiasis, just saw Dr [name] 07/25/2011    Class: History of  . DYSPHAGIA 03/24/2010  . Chronic ischemic heart disease 12/03/2007  . RECTAL BLEEDING 12/03/2007  . Diarrhea 12/03/2007  . EPIGASTRIC PAIN 12/03/2007  . Hyperlipidemia 12/02/2007    Class: History of  . EXTERNAL HEMORRHOIDS 12/02/2007    Class: History of  . ESOPHAGITIS 12/02/2007  . GASTRITIS 12/02/2007  . COLITIS 12/02/2007  . DIVERTICULOSIS, COLON 12/02/2007  . ARTHRITIS 12/02/2007    Class: Chronic   Outpatient Encounter Prescriptions as of 09/04/2014  Medication Sig  . acetaminophen (TYLENOL) 325 MG tablet Take 650 mg by mouth every 6 (six) hours as needed for mild pain or moderate pain.   Marland Kitchen apixaban (ELIQUIS) 2.5 MG TABS tablet Take 1 tablet (2.5 mg total) by mouth 2 (two) times daily.  Marland Kitchen aspirin EC 81 MG tablet Take 81 mg by mouth daily.  . Calcium Citrate-Vitamin D (CITRACAL + D PO) Take 1 tablet by mouth daily.  . carboxymethylcellulose (REFRESH PLUS) 0.5 % SOLN Place 1 drop into both eyes 3 (three) times daily as needed (dry eyes).   . Cholecalciferol (VITAMIN D) 1000 UNITS capsule Take 2,000 Units by mouth daily.   . CRESTOR 40 MG tablet TAKE 1 TABLET DAILY  . Ferrous Sulfate (IRON) 325 (65 FE) MG TABS Take 325 tablets by mouth daily.  Marland Kitchen lisinopril (PRINIVIL,ZESTRIL) 10 MG tablet Take 1 tablet (  10 mg total) by mouth 2 (two) times daily. Patient takes doses 12 hours apart at 1000am and 2200pm  . meclizine (ANTIVERT) 12.5 MG tablet Take 1 tablet (12.5 mg total) by mouth 3 (three) times daily as needed for dizziness.  . metoprolol tartrate (LOPRESSOR) 25 MG tablet Take 1 tablet (25 mg total) by mouth 2 (two) times daily. Patient takes doses 12 hours apart at 1000am and 2200pm   . Multiple Vitamin (MULTIVITAMIN WITH MINERALS) TABS tablet Take 1 tablet by mouth daily.  . nitroGLYCERIN (NITROSTAT) 0.4 MG SL tablet Place 1 tablet (0.4 mg total) under the tongue every 5 (five) minutes as needed for chest pain.  . NORPACE CR 100 MG 12 hr capsule Take 1 capsule (100 mg total) by mouth 2 (two) times daily.  . pantoprazole (PROTONIX) 40 MG tablet Take 1 tablet (40 mg total) by mouth 2 (two) times daily.  Marland Kitchen PARoxetine (PAXIL) 20 MG tablet Take 1 tablet (20 mg total) by mouth every morning.  . torsemide (DEMADEX) 20 MG tablet Take 20 mg by mouth daily as needed (edema).  . [DISCONTINUED] PARoxetine (PAXIL) 20 MG tablet TAKE 1 TABLET IN MORNING   No facility-administered encounter medications on file as of 09/04/2014.      Review of Systems  Constitutional: Negative.   HENT: Negative.   Eyes: Negative.   Respiratory: Negative.   Cardiovascular: Negative.   Gastrointestinal: Negative.   Endocrine: Negative.   Genitourinary: Positive for dysuria (burning).  Musculoskeletal: Negative.   Skin: Negative.   Allergic/Immunologic: Negative.   Neurological: Negative.   Hematological: Negative.   Psychiatric/Behavioral: Negative.        Objective:   Physical Exam  Constitutional: She is oriented to person, place, and time. She appears well-developed and well-nourished. No distress.  Pleasant and alert  HENT:  Head: Normocephalic.  Right Ear: External ear normal.  Left Ear: External ear normal.  Mouth/Throat: Oropharynx is clear and moist. No oropharyngeal exudate.  Minimal nasal congestion  Eyes: Conjunctivae and EOM are normal. Pupils are equal, round, and reactive to light. Right eye exhibits no discharge. Left eye exhibits no discharge. No scleral icterus.  Neck: Normal range of motion. Neck supple. No thyromegaly present.  Neck without bruits or thyromegaly or adenopathy  Cardiovascular: Normal rate, normal heart sounds and intact distal pulses.   No murmur  heard. The heart was slightly irregular at 72/m  Pulmonary/Chest: Effort normal and breath sounds normal. No respiratory distress. She has no wheezes. She has no rales. She exhibits no tenderness.  Clear anteriorly and posteriorly  Abdominal: Soft. Bowel sounds are normal. She exhibits no mass. There is no tenderness. There is no rebound and no guarding.  Abdomen is nontender except in the suprapubic area there is tenderness to palpation.  Musculoskeletal: Normal range of motion. She exhibits no edema.  Lymphadenopathy:    She has no cervical adenopathy.  Neurological: She is alert and oriented to person, place, and time. She has normal reflexes. No cranial nerve deficit.  Skin: Skin is warm and dry. No rash noted.  Psychiatric: She has a normal mood and affect. Her behavior is normal. Judgment and thought content normal.  Nursing note and vitals reviewed.  BP 147/74 mmHg  Pulse 58  Temp(Src) 97.4 F (36.3 C) (Oral)  Ht _0  (1.6 m)  Wt 180 lb (81.647 kg)  BMI 31.89 kg/m2        Assessment & Plan:  1. Hyperlipidemia -Continue with aggressive therapeutic lifestyle changes which include  diet and exercise - BMP8+EGFR - Lipid panel - CBC with Differential/Platelet  2. Vitamin D deficiency -Continue with vitamin D replacement pending results of lab work - BMP8+EGFR - Vit D  25 hydroxy (rtn osteoporosis monitoring) - CBC with Differential/Platelet  3. ASCVD (arteriosclerotic cardiovascular disease) -Continue follow-up with cardiology - BMP8+EGFR - Hepatic function panel - Lipid panel - CBC with Differential/Platelet  4. Chronic renal insufficiency, stage III (moderate), Scr 1.5 in the past -No change in treatment pending results of lab work - BMP8+EGFR - CBC with Differential/Platelet  5. Paroxysmal atrial fibrillation -The heart was fairly regular today only an occasional irregularity. - BMP8+EGFR - CBC with Differential/Platelet  6. Gastroesophageal reflux  disease, esophagitis presence not specified -This is well-controlled with her pantoprazole and she will continue to take this and follow up as needed with the gastroenterologist - BMP8+EGFR - Hepatic function panel - CBC with Differential/Platelet  7. Screening for malignant neoplasm of the rectum -She has not seen any blood but will return the FOBT card - Fecal occult blood, imunochemical - BMP8+EGFR - CBC with Differential/Platelet  8. Dysuria -Septra DS 1 twice daily for infection pending results of urine culture. Take this with food and drink plenty of fluids - POCT UA - Microscopic Only - POCT urinalysis dipstick - CBC with Differential/Platelet  9. Acute cystitis without hematuria -Take antibiotic as directed, await urine culture and check urinalysis when antibiotic is completed  Meds ordered this encounter  Medications  . sulfamethoxazole-trimethoprim (BACTRIM DS,SEPTRA DS) 800-160 MG per tablet    Sig: Take 1 tablet by mouth 2 (two) times daily.    Dispense:  14 tablet    Refill:  0   Patient Instructions                       Medicare Annual Wellness Visit  [place] and the medical providers at [facility] strive to bring you the best medical care.  In doing so we not only want to address your current medical conditions and concerns but also to detect new conditions early and prevent illness, disease and health-related problems.    Medicare offers a yearly Wellness Visit which allows our clinical staff to assess your need for preventative services including immunizations, lifestyle education, counseling to decrease risk of preventable diseases and screening for fall risk and other medical concerns.    This visit is provided free of charge (no copay) for all Medicare recipients. The clinical pharmacists at [facility] have begun to conduct these Wellness Visits which will also include a thorough review of all your  medications.    As you primary medical provider recommend that you make an appointment for your Annual Wellness Visit if you have not done so already this year.  You may set up this appointment before you leave today or you may call back ([phone]) and schedule an appointment.  Please make sure when you call that you mention that you are scheduling your Annual Wellness Visit with the clinical pharmacist so that the appointment may be made for the proper length of time.     Continue current medications. Continue good therapeutic lifestyle changes which include good diet and exercise. Fall precautions discussed with patient. If an FOBT was given today- please return it to our front desk. If you are over 39 years old - you may need Prevnar 57 or the adult Pneumonia vaccine.  **Flu shots will be available soon--- please call and  schedule a FLU-CLINIC appointment**  After your visit with [place] today you will receive a survey in the mail or online from [employer] regarding your care with [place]. Please take a moment to fill this out. Your feedback is very important to [place] as you can help [place] better understand your patient needs as well as improve your experience and satisfaction. WE CARE ABOUT YOU!!!   **Please join [place] SEPT.22, 2016 from 5:00 to 7:00pm for our OPEN HOUSE! Come out and meet our NEW providers**  The patient should take the iron that was prescribed by the gastroenterologist. She should drink plenty of water and fluids daily. She should take the antibiotic that we are prescribing for the urinary tract infection at least until the culture and sensitivity is returned and we may have to change the antibiotic at that time. Follow-up with cardiology as planned follow-up with urology as planned Follow-up with GI as needed    Arrie Senate MD

## 2014-09-05 ENCOUNTER — Telehealth: Payer: Self-pay | Admitting: *Deleted

## 2014-09-05 LAB — CBC WITH DIFFERENTIAL/PLATELET
BASOS ABS: 0.1 10*3/uL (ref 0.0–0.2)
BASOS: 1 %
EOS (ABSOLUTE): 0.5 10*3/uL — ABNORMAL HIGH (ref 0.0–0.4)
Eos: 8 %
HEMATOCRIT: 35.2 % (ref 34.0–46.6)
HEMOGLOBIN: 11.2 g/dL (ref 11.1–15.9)
IMMATURE GRANULOCYTES: 0 %
Immature Grans (Abs): 0 10*3/uL (ref 0.0–0.1)
LYMPHS: 28 %
Lymphocytes Absolute: 1.7 10*3/uL (ref 0.7–3.1)
MCH: 28.1 pg (ref 26.6–33.0)
MCHC: 31.8 g/dL (ref 31.5–35.7)
MCV: 88 fL (ref 79–97)
Monocytes Absolute: 0.7 10*3/uL (ref 0.1–0.9)
Monocytes: 10 %
NEUTROS ABS: 3.3 10*3/uL (ref 1.4–7.0)
Neutrophils: 53 %
Platelets: 311 10*3/uL (ref 150–379)
RBC: 3.98 x10E6/uL (ref 3.77–5.28)
RDW: 15.1 % (ref 12.3–15.4)
WBC: 6.2 10*3/uL (ref 3.4–10.8)

## 2014-09-05 LAB — LIPID PANEL
CHOL/HDL RATIO: 2.8 ratio (ref 0.0–4.4)
Cholesterol, Total: 169 mg/dL (ref 100–199)
HDL: 61 mg/dL (ref >39)
LDL CALC: 76 mg/dL (ref 0–99)
TRIGLYCERIDES: 160 mg/dL — AB (ref 0–149)
VLDL Cholesterol Cal: 32 mg/dL (ref 5–40)

## 2014-09-05 LAB — HEPATIC FUNCTION PANEL
ALBUMIN: 3.8 g/dL (ref 3.5–4.7)
ALT: 13 IU/L (ref 0–32)
AST: 16 IU/L (ref 0–40)
Alkaline Phosphatase: 82 IU/L (ref 39–117)
Bilirubin Total: 0.5 mg/dL (ref 0.0–1.2)
Bilirubin, Direct: 0.14 mg/dL (ref 0.00–0.40)
TOTAL PROTEIN: 6.7 g/dL (ref 6.0–8.5)

## 2014-09-05 LAB — BMP8+EGFR
BUN / CREAT RATIO: 13 (ref 11–26)
BUN: 15 mg/dL (ref 8–27)
CALCIUM: 9.2 mg/dL (ref 8.7–10.3)
CO2: 21 mmol/L (ref 18–29)
Chloride: 99 mmol/L (ref 97–108)
Creatinine, Ser: 1.19 mg/dL — ABNORMAL HIGH (ref 0.57–1.00)
GFR calc Af Amer: 50 mL/min/{1.73_m2} — ABNORMAL LOW (ref >59)
GFR, EST NON AFRICAN AMERICAN: 43 mL/min/{1.73_m2} — AB (ref >59)
Glucose: 151 mg/dL — ABNORMAL HIGH (ref 65–99)
Potassium: 4.6 mmol/L (ref 3.5–5.2)
Sodium: 137 mmol/L (ref 134–144)

## 2014-09-05 LAB — VITAMIN D 25 HYDROXY (VIT D DEFICIENCY, FRACTURES): Vit D, 25-Hydroxy: 25.4 ng/mL — ABNORMAL LOW (ref 30.0–100.0)

## 2014-09-05 LAB — SPECIMEN STATUS REPORT

## 2014-09-05 NOTE — Telephone Encounter (Signed)
Pt notified of results Verbalizes understanding Notified pt to hold Crestor until she finishes Bactrim

## 2014-09-05 NOTE — Addendum Note (Signed)
Addended by: Lyn Henri C on: 09/05/2014 10:45 AM   Modules accepted: Orders

## 2014-09-05 NOTE — Telephone Encounter (Signed)
-----   Message from Chipper Herb, MD sent at 09/05/2014 10:11 AM EDT ----- The blood sugar is elevated at 151. The creatinine, the most important kidney function test is also slightly elevated but consistent with past readings. The electrolytes including potassium are good. LAB----please add a hemoglobin A1c All liver function tests are within normal limits Cholesterol numbers with traditional lipid testing have a total cholesterol that is good at 169. Triglycerides remain elevated at about the same level as in the past at 160. The LDL C is good at 76.------ the patient should continue with Crestor and with as aggressive therapeutic lifestyle changes as possible, since she is taking sulfa for a urinary tract infection, it would probably be a good idea just to hold the Crestor while she is on the sulfa medication.+++++++ The vitamin D level remains low as it has been in the past------ because of her elevated creatinine she should not take anymore vitamin D than she is currently taking. The CBC has a normal white blood cell count. The hemoglobin is good at 11.2 and the platelet count is adequate.

## 2014-09-05 NOTE — Addendum Note (Signed)
Addended by: Marin Olp on: 09/05/2014 10:25 AM   Modules accepted: Orders

## 2014-09-06 LAB — FECAL OCCULT BLOOD, IMMUNOCHEMICAL: Fecal Occult Bld: NEGATIVE

## 2014-09-07 LAB — SPECIMEN STATUS REPORT

## 2014-09-07 LAB — HGB A1C W/O EAG: HEMOGLOBIN A1C: 6.7 % — AB (ref 4.8–5.6)

## 2014-09-11 ENCOUNTER — Telehealth: Payer: Self-pay | Admitting: Family Medicine

## 2014-09-11 NOTE — Telephone Encounter (Signed)
Patient informed of all results, appointment with tammy on 09/14/14, voices understanding

## 2014-09-14 ENCOUNTER — Ambulatory Visit (INDEPENDENT_AMBULATORY_CARE_PROVIDER_SITE_OTHER): Payer: Medicare Other | Admitting: Pharmacist

## 2014-09-14 ENCOUNTER — Encounter: Payer: Self-pay | Admitting: Pharmacist

## 2014-09-14 VITALS — BP 138/74 | HR 76 | Ht 63.0 in | Wt 180.0 lb

## 2014-09-14 DIAGNOSIS — E7800 Pure hypercholesterolemia, unspecified: Secondary | ICD-10-CM

## 2014-09-14 DIAGNOSIS — I2510 Atherosclerotic heart disease of native coronary artery without angina pectoris: Secondary | ICD-10-CM | POA: Diagnosis not present

## 2014-09-14 DIAGNOSIS — I10 Essential (primary) hypertension: Secondary | ICD-10-CM

## 2014-09-14 DIAGNOSIS — N183 Chronic kidney disease, stage 3 unspecified: Secondary | ICD-10-CM

## 2014-09-14 DIAGNOSIS — N189 Chronic kidney disease, unspecified: Secondary | ICD-10-CM

## 2014-09-14 DIAGNOSIS — E119 Type 2 diabetes mellitus without complications: Secondary | ICD-10-CM | POA: Diagnosis not present

## 2014-09-14 MED ORDER — ACCU-CHEK SOFTCLIX LANCET DEV MISC: Status: DC

## 2014-09-14 MED ORDER — GLUCOSE BLOOD VI STRP: ORAL_STRIP | Status: DC

## 2014-09-14 NOTE — Patient Instructions (Signed)
Diabetes and Standards of Medical Care   Diabetes is complicated. You may find that your diabetes team includes a dietitian, nurse, diabetes educator, eye doctor, and more. To help everyone know what is going on and to help you get the care you deserve, the following schedule of care was developed to help keep you on track. Below are the tests, exams, vaccines, medicines, education, and plans you will need.  Blood Glucose Goals Prior to meals = 80 - 130 Within 2 hours of the start of a meal = less than 180  HbA1c test (goal is less than 7.0% - your last value was %) This test shows how well you have controlled your glucose over the past 2 to 3 months. It is used to see if your diabetes management plan needs to be adjusted.   It is performed at least 2 times a year if you are meeting treatment goals.  It is performed 4 times a year if therapy has changed or if you are not meeting treatment goals.  Blood pressure test  This test is performed at every routine medical visit. The goal is less than 140/90 mmHg for most people, but 130/80 mmHg in some cases. Ask your health care provider about your goal.  Dental exam  Follow up with the dentist regularly.  Eye exam  If you are diagnosed with type 1 diabetes as a child, get an exam upon reaching the age of 10 years or older and have had diabetes for 3 to 5 years. Yearly eye exams are recommended after that initial eye exam.  If you are diagnosed with type 1 diabetes as an adult, get an exam within 5 years of diagnosis and then yearly.  If you are diagnosed with type 2 diabetes, get an exam as soon as possible after the diagnosis and then yearly.  Foot care exam  Visual foot exams are performed at every routine medical visit. The exams check for cuts, injuries, or other problems with the feet.  A comprehensive foot exam should be done yearly. This includes visual inspection as well as assessing foot pulses and testing for loss of  sensation.  Check your feet nightly for cuts, injuries, or other problems with your feet. Tell your health care provider if anything is not healing.  Kidney function test (urine microalbumin)  This test is performed once a year.  Type 1 diabetes: The first test is performed 5 years after diagnosis.  Type 2 diabetes: The first test is performed at the time of diagnosis.  A serum creatinine and estimated glomerular filtration rate (eGFR) test is done once a year to assess the level of chronic kidney disease (CKD), if present.  Lipid profile (cholesterol, HDL, LDL, triglycerides)  Performed every 5 years for most people.  The goal for LDL is less than 100 mg/dL. If you are at high risk, the goal is less than 70 mg/dL.  The goal for HDL is 40 mg/dL to 50 mg/dL for men and 50 mg/dL to 60 mg/dL for women. An HDL cholesterol of 60 mg/dL or higher gives some protection against heart disease.  The goal for triglycerides is less than 150 mg/dL.  Influenza vaccine, pneumococcal vaccine, and hepatitis B vaccine  The influenza vaccine is recommended yearly.  The pneumococcal vaccine is generally given once in a lifetime. However, there are some instances when another vaccination is recommended. Check with your health care provider.  The hepatitis B vaccine is also recommended for adults with diabetes.    Diabetes self-management education  Education is recommended at diagnosis and ongoing as needed.  Treatment plan  Your treatment plan is reviewed at every medical visit.  Document Released: 10/30/2008 Document Revised: 09/04/2012 Document Reviewed: 06/04/2012 ExitCare Patient Information 2014 [employer].   

## 2014-09-14 NOTE — Progress Notes (Signed)
Subjective:    [name] is a 81 y.o. female who presents for an initial evaluation of Type 2 [name] mellitus and for [name] education.  She was diagnosed with type 2 DM 09/04/2014 and past diagnosis of metabolic syndrome.    Current symptoms/problems include hyperglycemia and hypoglycemia  and have been unchanged. Symptoms have been present for 1 month.  Family history of DM - daughter has [name]  The patient was initially diagnosed with Type 2 [name] mellitus based on the following criteria:  FBG of 151 and A1c of 6.7%.  Known diabetic complications: none Cardiovascular risk factors: advanced age (older than 88 for men, 10 for women), [name] mellitus, dyslipidemia, hypertension, obesity (BMI >= 30 kg/m2) and sedentary lifestyle Current diabetic medications include none.   Eye exam current (within one year): unknown Weight trend: stable Prior visit with dietician: no Current diet: in general, a "healthy" diet   Current exercise: none  Current monitoring regimen: none Home blood sugar records: none Any episodes of hypoglycemia? Possibly - patient reports episodes that sound like hypoglycemia but have not been confirmed.  Is She on ACE inhibitor or angiotensin II receptor blocker?  Yes  lisinopril (Zestril)    The following portions of the patient's history were reviewed and updated as appropriate: allergies, current medications, past family history, past medical history, past social history, past surgical history and problem list.   Objective:    BP 138/74 mmHg  Pulse 76  Ht 5\' 3"  (1.6 m)  Wt 180 lb (81.647 kg)  BMI 31.89 kg/m2  A1c = 6.7% (09/04/2014)  Lab Review GLUCOSE (mg/dL)  Date Value  09/04/2014 151*  06/03/2014 113*  03/23/2014 89   GLUCOSE, BLD (mg/dL)  Date Value  02/10/2014 102*  12/30/2013 89  12/08/2013 66*   CO2 (mmol/L)  Date Value  09/04/2014 21  06/03/2014 25  03/23/2014 22   BUN (mg/dL)  Date Value   09/04/2014 15  06/03/2014 19  03/23/2014 20  02/10/2014 22  12/30/2013 20  12/08/2013 18   CREAT (mg/dL)  Date Value  07/31/2012 1.52*   CREATININE, SER (mg/dL)  Date Value  09/04/2014 1.19*  06/03/2014 1.20*  03/23/2014 1.05*     Assessment:    [name] Mellitus type II, Newly diagnosed but under good control.   Stage 3 chronic kidney disease HTN - controlled per today's BP Hyperlipidemia - elevated triglycerides   Plan:    1.  Rx changes: none - considered metformin but due to GFR of 43 and A1c not over 7% will not start medication at this time for [name] 2.  Education: Reviewed 'ABCs' of [name] management (respective goals in parentheses):  A1C (<7), blood pressure (<130/80), and cholesterol (LDL <100). 3.  Compliance at present is estimated to be good. Efforts to improve compliance (if necessary) will be directed at dietary modifications: educated patient and daugher in law about CHO counting - serving sizes and CHO recommendations - 45 to 50 gram per meal and 15 to 20 grams per snack, increased exercise and regular blood sugar monitoring: qod to qd times daily. 4. Patient is given Accu Check Aviva Plus glucometer and taught how to use.  Performed test in office and BG was 111 today.  Discussed BG goals and also discussed hypoglycemia and treatment.  5.  Follow up: 1 month    Cherre Robins, PharmD, CPP, CDE

## 2014-09-16 ENCOUNTER — Encounter: Payer: Self-pay | Admitting: Cardiology

## 2014-09-22 ENCOUNTER — Encounter: Payer: Self-pay | Admitting: Cardiovascular Disease

## 2014-09-30 ENCOUNTER — Encounter: Payer: Self-pay | Admitting: Cardiology

## 2014-10-20 ENCOUNTER — Ambulatory Visit: Payer: Medicare Other | Admitting: Family Medicine

## 2014-10-21 ENCOUNTER — Encounter: Payer: Self-pay | Admitting: Pharmacist

## 2014-10-21 ENCOUNTER — Ambulatory Visit: Payer: Self-pay | Admitting: Pharmacist

## 2014-10-21 ENCOUNTER — Ambulatory Visit (INDEPENDENT_AMBULATORY_CARE_PROVIDER_SITE_OTHER): Payer: Medicare Other | Admitting: Pharmacist

## 2014-10-21 ENCOUNTER — Other Ambulatory Visit: Payer: Self-pay

## 2014-10-21 VITALS — BP 136/80 | HR 60 | Ht 63.0 in | Wt 182.0 lb

## 2014-10-21 DIAGNOSIS — Z23 Encounter for immunization: Secondary | ICD-10-CM | POA: Diagnosis not present

## 2014-10-21 DIAGNOSIS — Z0000 Encounter for general adult medical examination without abnormal findings: Secondary | ICD-10-CM | POA: Diagnosis not present

## 2014-10-21 DIAGNOSIS — Z1231 Encounter for screening mammogram for malignant neoplasm of breast: Secondary | ICD-10-CM

## 2014-10-21 DIAGNOSIS — E119 Type 2 diabetes mellitus without complications: Secondary | ICD-10-CM

## 2014-10-21 NOTE — Patient Instructions (Addendum)
Ms. Salber , Thank you for taking time to come for your Medicare Wellness Visit. I appreciate your ongoing commitment to your health goals. Please review the following plan we discussed and let me know if I can assist you in the future.   These are the goals we discussed: Continue to limit intake and portion size of high sugar and carbohydrate containing foods.   Increase non-starchy vegetables - carrots, green bean, squash, zucchini, tomatoes, onions, peppers, spinach and other green leafy vegetables, cabbage, lettuce, cucumbers, asparagus, okra (not fried), eggplant limit sugar and processed foods (cakes, cookies, ice cream, crackers and chips) Increase fresh fruit but limit serving sizes 1/2 cup or about the size of tennis or baseball limit red meat to no more than 1-2 times per week (serving size about the size of your palm) Choose whole grains / lean proteins - whole wheat bread, quinoa, whole grain rice (1/2 cup), fish, chicken, [place]  Try either chair exercises from handout given or consider Silver Sneakers program.  - recommend exercise at least 4 to 5 days per week.   Please bring in copy of Advanced Directives - Living Will and/or [place]     This is a list of the screening recommended for you and due dates:  Health Maintenance  Topic Date Due  . Eye exam for diabetics  Has appt with Dr Zadie Rhine in 1 week  . Urine Protein Check  10/19/2014 - done today  . Flu Shot  11/04/2015 - done today  . DEXA scan (bone density measurement)  Plan to get in next 2 to 3 months  . Pneumonia vaccines (2 of 2 - PPSV23) completed  . Mammogram  11/05/2014 - has appointment   . Hemoglobin A1C  03/07/2015  . Complete foot exam   09/04/2015  . Tetanus Vaccine  04/16/2020  . Shingles Vaccine  Completed  *Topic was postponed. The date shown is not the original due date.    Kegel Exercises The goal of Kegel exercises is to isolate and exercise your pelvic floor muscles. These  muscles act as a hammock that supports the rectum, vagina, small intestine, and uterus. As the muscles weaken, the hammock sags and these organs are displaced from their normal positions. Kegel exercises can strengthen your pelvic floor muscles and help you to improve bladder and bowel control, improve sexual response, and help reduce many problems and some discomfort during pregnancy. Kegel exercises can be done anywhere and at any time. HOW TO PERFORM KEGEL EXERCISES 1. Locate your pelvic floor muscles. To do this, squeeze (contract) the muscles that you use when you try to stop the flow of urine. You will feel a tightness in the vaginal area (women) and a tight lift in the rectal area (men and women). 2. When you begin, contract your pelvic muscles tight for 2-5 seconds, then relax them for 2-5 seconds. This is one set. Do 4-5 sets with a short pause in between. 3. Contract your pelvic muscles for 8-10 seconds, then relax them for 8-10 seconds. Do 4-5 sets. If you cannot contract your pelvic muscles for 8-10 seconds, try 5-7 seconds and work your way up to 8-10 seconds. Your goal is 4-5 sets of 10 contractions each day. Keep your stomach, buttocks, and legs relaxed during the exercises. Perform sets of both short and long contractions. Vary your positions. Perform these contractions 3-4 times per day. Perform sets while you are:   Lying in bed in the morning.  Standing at  lunch.  Sitting in the late afternoon.  Lying in bed at night. You should do 40-50 contractions per day. Do not perform more Kegel exercises per day than recommended. Overexercising can cause muscle fatigue. Continue these exercises for for at least 15-20 weeks or as directed by your caregiver.   This information is not intended to replace advice given to you by your health care provider. Make sure you discuss any questions you have with your health care provider.   Document Released: 12/20/2011 Document Revised: 01/23/2014  Document Reviewed: 12/20/2011 Elsevier Interactive Patient Education [employer].

## 2014-10-21 NOTE — Progress Notes (Signed)
Patient ID: [name], female   DOB: 1931-04-13, 81 y.o.   MRN: [id number]   Subjective:   [name] is a 81 y.o. white female who presents for an Initial Medicare Annual Wellness Visit and to recheck BG / diabetes.   [name] has been widowed since 2001.  She lives alone but reports that 3 of her children live close by and they are very supportive.    Current Medications (verified) Outpatient Encounter Prescriptions as of 10/21/2014  Medication Sig  . acetaminophen (TYLENOL) 325 MG tablet Take 650 mg by mouth every 6 (six) hours as needed for mild pain or moderate pain.   Marland Kitchen apixaban (ELIQUIS) 2.5 MG TABS tablet Take 1 tablet (2.5 mg total) by mouth 2 (two) times daily.  Marland Kitchen aspirin EC 81 MG tablet Take 81 mg by mouth daily.  . carboxymethylcellulose (REFRESH PLUS) 0.5 % SOLN Place 1 drop into both eyes 3 (three) times daily as needed (dry eyes).   . Cholecalciferol (VITAMIN D) 1000 UNITS capsule Take 3,000 Units by mouth daily.   . CRESTOR 40 MG tablet TAKE 1 TABLET DAILY  . Ferrous Sulfate (IRON) 325 (65 FE) MG TABS Take 325 tablets by mouth daily.  Marland Kitchen glucose blood (ACCU-CHEK AVIVA PLUS) test strip Use to check BG once daily.  DX: E11.9  . Lancet Devices (ACCU-CHEK SOFTCLIX) lancets Use to check BG once daily.  Dx:  E11.9  . lisinopril (PRINIVIL,ZESTRIL) 10 MG tablet Take 1 tablet (10 mg total) by mouth 2 (two) times daily. Patient takes doses 12 hours apart at 1000am and 2200pm  . metoprolol tartrate (LOPRESSOR) 25 MG tablet Take 1 tablet (25 mg total) by mouth 2 (two) times daily. Patient takes doses 12 hours apart at 1000am and 2200pm  . Multiple Vitamins-Minerals (CENTRUM SILVER ULTRA WOMENS PO) Take 1 tablet by mouth daily.  . NORPACE CR 100 MG 12 hr capsule Take 1 capsule (100 mg total) by mouth 2 (two) times daily.  . pantoprazole (PROTONIX) 40 MG tablet Take 1 tablet (40 mg total) by mouth 2 (two) times daily.  Marland Kitchen PARoxetine (PAXIL) 20 MG tablet Take 1 tablet (20 mg  total) by mouth every morning.  . torsemide (DEMADEX) 20 MG tablet Take 20 mg by mouth daily as needed (edema).  . Calcium Citrate-Vitamin D (CITRACAL + D PO) Take 1 tablet by mouth daily.  . nitroGLYCERIN (NITROSTAT) 0.4 MG SL tablet Place 1 tablet (0.4 mg total) under the tongue every 5 (five) minutes as needed for chest pain.  . [DISCONTINUED] meclizine (ANTIVERT) 12.5 MG tablet Take 1 tablet (12.5 mg total) by mouth 3 (three) times daily as needed for dizziness. (Patient not taking: Reported on 10/21/2014)  . [DISCONTINUED] Multiple Vitamin (MULTIVITAMIN WITH MINERALS) TABS tablet Take 1 tablet by mouth daily.   No facility-administered encounter medications on file as of 10/21/2014.    Allergies (verified) Adhesive; Actos; Contrast media; Latex; and Lipitor   History: Past Medical History  Diagnosis Date  . ICD (implantable cardiac defibrillator) in place     a. s/p initial ICD in 1993 in setting of cardiac arrest;  b. 01/2007 gen change: Guidant T135 Vitality DS VR single lead ICD.  Marland Kitchen Hypertension   . GERD (gastroesophageal reflux disease)     omeprazole  . Diverticulosis of colon (without mention of hemorrhage) 2007    Colonoscopy   . CAD (coronary artery disease)     a. history of cardiac arrest 1993;  b. s/p LAD/LCX stenting in  2003;  c. 07/2011 Cath: LM nl, LAD patent stent, D1 80ost, LCX patent stent, RCA min irregs;  d. 04/2012 MV: EF 66%, no ishcemia;  e. 12/2013 Echo: Ef 55%, no rwma, Gr 1 DD, triv AI/MR, mildly dil LA;  f. 12/2013 Lexi MV: intermediate risk - apical ischemia and inf/infsept fixed defect, ? artifactual.  . H/O hiatal hernia   . Orthostatic hypotension   . Chronic sinus bradycardia   . CKD (chronic kidney disease), stage III   . History of DVT in the past, not on Coumadin now     left  leg  . HYPERCHOLESTEROLEMIA   . Nephrolithiasis, just saw Dr [name]   . EXTERNAL HEMORRHOIDS   . ARTHRITIS   . Esophagitis, unspecified     a. 2012 EGD  . Unspecified  gastritis and gastroduodenitis without mention of hemorrhage     a. 2003 EGD->not noted on 2012 EGD.  . Macular degeneration     gets injecton in eye every 5 weeks- last injection - 05/03/2013   . Arthritis   . Peripheral vascular disease ([place])     ???  . Hiatal hernia     a. 2012 EGD.  Marland Kitchen Esophageal stricture     a. 2012 s/p dil.  . Myocardial infarction ([place]) 1993  . AICD (automatic cardioverter/defibrillator) present   . CAD in native artery 12/31/2013    Previous stents to LAD and Ramus patent on cath today 12/31/13 also There is severe disease in the ostial first diagonal which is unchanged from most recent cardiac catheterization. The right coronary artery could not be engaged selectively but nonselective angiography showed no significant disease in the proximal and midsegment.     . Diabetes mellitus without complication ([place])   . Anxiety   . Cataract    Past Surgical History  Procedure Laterality Date  . Cardiac defibrillator placement  02/05/2007    Guidant  . Hemorroidectomy  80's  . Hysteroscopy w/d&c  09/02/2010    Procedure: DILATATION AND CURETTAGE (D&C) /HYSTEROSCOPY;  Surgeon: Margarette Asal;  Location: [facility];  Service: Gynecology;  Laterality: N/A;  Dilation and Curettage with Hysteroscopy and Polypectomy  . Cardiac catheterization    . Coronary stents     . Cystoscopy with ureteroscopy Right 05/20/2013    Procedure: CYSTOSCOPY, RIGHT URETEROSCOPY STONE EXTRACTION, Insertion of right DOUBLE J STENT ;  Surgeon: Irine Seal, MD;  Location: [facility];  Service: Urology;  Laterality: Right;  . Holmium laser application Right [id number]    Procedure: HOLMIUM LASER APPLICATION;  Surgeon: Irine Seal, MD;  Location: [facility];  Service: Urology;  Laterality: Right;  . Cystoscopy with ureteroscopy and stent placement N/A 06/03/2013    Procedure: SECOND LOOK CYSTOSCOPY WITH URETEROSCOPY  HOLMIUM LASER LITHO AND STONE EXTRACTION Sammie Bench ;  Surgeon: Malka So, MD;  Location: [facility];   Service: Urology;  Laterality: N/A;  . Left heart catheterization with coronary angiogram N/A 07/28/2011    Procedure: LEFT HEART CATHETERIZATION WITH CORONARY ANGIOGRAM;  Surgeon: Lorretta Harp, MD;  Location: [facility];  Service: Cardiovascular;  Laterality: N/A;  . Cardiovascular stress test  12/30/2013    abnormal  . Cardiac catheterization  01/01/15    patent stents -there is severe disease in ostial 1st diag but without change.   . Left heart catheterization with coronary angiogram N/A 12/31/2013    Procedure: LEFT HEART CATHETERIZATION WITH CORONARY ANGIOGRAM;  Surgeon: Wellington Hampshire, MD;  Location: [facility];  Service: Cardiovascular;  Laterality: N/A;  . Implantable cardioverter defibrillator (icd) generator change N/A 02/10/2014    Procedure: ICD GENERATOR CHANGE;  Surgeon: Sanda Klein, MD;  Location[facility];  Service: Cardiovascular;  Laterality: N/A;   Family History  Problem Relation Age of Onset  . Stroke Mother   . Other Mother     brain tumor  . Other Father     MI  . Heart attack Father   . Stroke Sister   . Macular degeneration Sister   . Colon polyps Neg Hx   . Diabetes Daughter   . Cancer Daughter     ovarian  . Atrial fibrillation Sister   . Hyperlipidemia Sister   . Osteoporosis Sister   . Stroke Sister   . Uterine cancer Sister   . Heart attack Brother   . Asthma Brother    Social History   Occupational History  . Not on file.   Social History Main Topics  . Smoking status: Never Smoker   . Smokeless tobacco: Never Used  . Alcohol Use: No  . Drug Use: No  . Sexual Activity: No    Do you feel safe at home?  Yes  Dietary issues and exercise activities: Current Exercise Habits:: Exercise is limited by, Limited by:: orthopedic condition(s)  Current Dietary habits:  Patient has been limiting CHO in diet and HBG readings have been very good.    Objective:    Today's Vitals   10/21/14 1041  BP: 136/80  Pulse: 60  Height: 5'  3" (1.6 m)  Weight: 182 lb (82.555 kg)  PainSc: 2   PainLoc: Knee   Body mass index is 32.25 kg/(m^2).   HBG readings: 7 day avg = 120 14 day avg = 113 30 day avg = 114 Ranges from 93 to 146  Activities of Daily Living In your present state of health, do you have any difficulty performing the following activities: 10/21/2014 02/10/2014  Hearing? N N  Vision? Y N  Difficulty concentrating or making decisions? N N  Walking or climbing stairs? Y N  Dressing or bathing? N N  Doing errands, shopping? Y -  Conservation officer, nature and eating ? N -  Using the Toilet? N -  In the past six months, have you accidently leaked urine? N -  Do you have problems with loss of bowel control? N -  Managing your Medications? N -  Managing your Finances? N -  Housekeeping or managing your Housekeeping? N -    Are there smokers in your home (other than you)? No   Cardiac Risk Factors include: advanced age (>80men, >69 women);diabetes mellitus;dyslipidemia;family history of premature cardiovascular disease;hypertension;obesity (BMI >30kg/m2);sedentary lifestyle  Depression Screen PHQ 2/9 Scores 10/21/2014 09/04/2014 06/03/2014 01/26/2014  PHQ - 2 Score 1 1 0 0    Fall Risk Fall Risk  10/21/2014 09/04/2014 06/03/2014 01/26/2014 09/15/2013  Falls in the past year? No No No No No  Number falls in past yr: - - - - -  Injury with Fall? - - - - -  Risk for fall due to : - - - - -    Cognitive Function: MMSE - Mini Mental State Exam 10/21/2014  Orientation to time 5  Orientation to Place 5  Registration 3  Attention/ Calculation 5  Recall 1  Language- name 2 objects 2  Language- repeat 1  Language- follow 3 step command 3  Language- read & follow direction 1  Write a sentence 1  Copy  design 1  Total score 28    Immunizations and Health Maintenance Immunization History  Administered Date(s) Administered  . Influenza,inj,Quad PF,36+ Mos 11/11/2013, 10/21/2014  . Influenza-Unspecified 11/16/2012  .  Pneumococcal Conjugate-13 11/18/2012  . Pneumococcal Polysaccharide-23 01/17/2003   Health Maintenance Due  Topic Date Due  . OPHTHALMOLOGY EXAM  10/19/1941  . URINE MICROALBUMIN  10[birth year]19/1943    Patient Care Team: Chipper Herb, MD as PCP - General (Family Medicine) Hurman Horn, MD as Consulting Physician (Ophthalmology) Paralee Cancel, MD as Consulting Physician (Orthopedic Surgery) Sanda Klein, MD as Consulting Physician (Cardiology) Irene Shipper, MD as Consulting Physician (Gastroenterology)   Optomtrist - Dr Marin Comment at [facility] in [place], [place] any recent Medical Services you may have received from other than [facility] providers in the past year (date may be approximate).    Assessment:    Annual Wellness Visit  Type 2 DM - controlled with diet   Screening Tests Health Maintenance  Topic Date Due  . OPHTHALMOLOGY EXAM  10/19/1943  . URINE MICROALBUMIN  10/19/1943  . INFLUENZA VACCINE  11/04/2014 (Originally 08/17/2014)  . DEXA SCAN  12/04/2014 (Originally 10/19/1998)  . PNA vac Low Risk Adult (2 of 2 - PPSV23) 12/04/2014 (Originally 11/18/2013)  . MAMMOGRAM  11/05/2014  . HEMOGLOBIN A1C  03/07/2015  . FOOT EXAM  09/04/2015  . TETANUS/TDAP  04/16/2020  . ZOSTAVAX  Completed        Plan:   During the course of the visit Adja was educated and counseled about the following appropriate screening and preventive services:   Vaccines to include Pneumoccal, Influenza, Hepatitis B, Td, Zostavax - patient is UTD - received influenza vaccine today  Colorectal cancer screening - UTD on colonoscopy and FOBT  Cardiovascular disease screening - follows up with Dr Sallyanne Kuster regularly.  Last lipid panel showed elevated triglycerides but patient has changed diet and I anticipate will be less than 150 at next check.  BP was at goal today.  Diabetes  - continue with current dietary changes of limiting high CHO foods and increasing whole grains, non starchy vegetables and lean  proteins   Bone Denisty / Osteoporosis Screening - patient is due however she would like to postpone until after she   Mammogram - appt 11/05/2014  PAP - no longer required  Glaucoma screening / Diabetic Eye Exam - sees Dr Zadie Rhine 10/26/2014 and has appt within last 4 months with Dr Marin Comment - copy of visit / report requested from Dr Gus Puma office.   Advanced Directives - UTD, patient reminded to bring copy to office for file.  Discussed Kegel exercises for better bladder control.  If not better then she will discuss pharmacotherapy options with PCP  Physical activity - gave handout and reviewed chair exercises.  Also gave information about silver sneakers program.     Patient Instructions (the written plan) were given to the patient.   Cherre Robins, [facility]   10/21/2014

## 2014-10-22 LAB — PROTEIN / CREATININE RATIO, URINE
Creatinine, Urine: 106 mg/dL
Protein, Ur: 16.9 mg/dL
Protein/Creat Ratio: 159 mg/g creat (ref 0–200)

## 2014-10-26 ENCOUNTER — Other Ambulatory Visit: Payer: Self-pay | Admitting: Family Medicine

## 2014-10-26 DIAGNOSIS — H353122 Nonexudative age-related macular degeneration, left eye, intermediate dry stage: Secondary | ICD-10-CM | POA: Diagnosis not present

## 2014-10-26 DIAGNOSIS — H353221 Exudative age-related macular degeneration, left eye, with active choroidal neovascularization: Secondary | ICD-10-CM | POA: Diagnosis not present

## 2014-10-27 DIAGNOSIS — M1711 Unilateral primary osteoarthritis, right knee: Secondary | ICD-10-CM | POA: Diagnosis not present

## 2014-10-27 DIAGNOSIS — M25561 Pain in right knee: Secondary | ICD-10-CM | POA: Diagnosis not present

## 2014-11-03 ENCOUNTER — Ambulatory Visit: Payer: Medicare Other

## 2014-11-09 DIAGNOSIS — H40033 Anatomical narrow angle, bilateral: Secondary | ICD-10-CM | POA: Diagnosis not present

## 2014-11-09 DIAGNOSIS — H2513 Age-related nuclear cataract, bilateral: Secondary | ICD-10-CM | POA: Diagnosis not present

## 2014-11-27 ENCOUNTER — Other Ambulatory Visit: Payer: Self-pay

## 2014-11-27 ENCOUNTER — Other Ambulatory Visit: Payer: Self-pay | Admitting: *Deleted

## 2014-11-27 MED ORDER — PAROXETINE HCL 20 MG PO TABS: 20.0000 mg | ORAL_TABLET | Freq: Every morning | ORAL | Status: DC

## 2014-11-27 MED ORDER — METOPROLOL TARTRATE 25 MG PO TABS: 25.0000 mg | ORAL_TABLET | Freq: Two times a day (BID) | ORAL | Status: DC

## 2014-12-03 ENCOUNTER — Ambulatory Visit
Admission: RE | Admit: 2014-12-03 | Discharge: 2014-12-03 | Disposition: A | Discharge status: Home / Self Care | Payer: Medicare Other | Source: Ambulatory Visit

## 2014-12-03 ENCOUNTER — Encounter: Payer: Self-pay | Admitting: Cardiovascular Disease

## 2014-12-03 ENCOUNTER — Ambulatory Visit (INDEPENDENT_AMBULATORY_CARE_PROVIDER_SITE_OTHER): Payer: Medicare Other | Admitting: Cardiovascular Disease

## 2014-12-03 VITALS — BP 142/84 | HR 59 | Ht 63.0 in | Wt 176.2 lb

## 2014-12-03 DIAGNOSIS — I951 Orthostatic hypotension: Secondary | ICD-10-CM

## 2014-12-03 DIAGNOSIS — N183 Chronic kidney disease, stage 3 unspecified: Secondary | ICD-10-CM

## 2014-12-03 DIAGNOSIS — E7800 Pure hypercholesterolemia, unspecified: Secondary | ICD-10-CM

## 2014-12-03 DIAGNOSIS — I259 Chronic ischemic heart disease, unspecified: Secondary | ICD-10-CM

## 2014-12-03 DIAGNOSIS — Z4502 Encounter for adjustment and management of automatic implantable cardiac defibrillator: Secondary | ICD-10-CM

## 2014-12-03 DIAGNOSIS — I472 Ventricular tachycardia: Secondary | ICD-10-CM

## 2014-12-03 DIAGNOSIS — I480 Paroxysmal atrial fibrillation: Secondary | ICD-10-CM

## 2014-12-03 DIAGNOSIS — R001 Bradycardia, unspecified: Secondary | ICD-10-CM | POA: Diagnosis not present

## 2014-12-03 DIAGNOSIS — I4729 Other ventricular tachycardia: Secondary | ICD-10-CM

## 2014-12-03 DIAGNOSIS — I2510 Atherosclerotic heart disease of native coronary artery without angina pectoris: Secondary | ICD-10-CM

## 2014-12-03 DIAGNOSIS — Z1231 Encounter for screening mammogram for malignant neoplasm of breast: Secondary | ICD-10-CM

## 2014-12-03 NOTE — Progress Notes (Signed)
Patient ID: [name], female   DOB: 01-07-1932, 81 y.o.   MRN: [id number]      Cardiology Office Note   Date:  12/03/2014   ID:  [name], DOB Jan 07, 1932, MRN [id number]  PCP:  Redge Gainer, MD  Cardiologist:  Sanda Klein, MD   Chief Complaint  Patient presents with  . ROV 6 months    patient reports occasional palpitations, shortness of breath with mininmal activity - "housework," and lightheadedness      History of Present Illness: [name] is a 81 y.o. female who presents for Paroxysmal atrial fibrillation, ICD check, history of cathecholaminergic polymorphic VT  She feels better ever since we backed down on her disopyramide dose. Her [employer] ICD (generator implanted in January 2016) shows no atrial fibrillation or VT/VF since surgery. There is <1% V pacing and longevity is estimated at 12 years. She still has occasional orthostatic dizziness, but this has also improved. She has mild (class 2) exertional dyspnea.  ICD check shows no VT or AF or V pacing. Battery longevity 12 years.  [name] has a history of adrenergically mediated polymorphic VT and received her first implantable defibrillator in 1993. She has been treated with disopyramide for a long time and has not had any recent defibrillator discharges.In 2009 she received a new endocardial defibrillator lead. Much later she developed coronary disease and received stents to the ramus intermedius artery and LAD artery. There has been no evidence of disease progression by subsequent cardiac catheterizations in 2006 & 2013 & late 2015.She has normal left ventricular systolic function and no overt heart failure. Most recent assessment of LVEF was in December 2015 when echo, nuclear perfusion study both showed EF of 55-60 percent. She had atrial fibrillation around the time of her initial cardiac arrest in 1993 and again during a hospitalization for acute pyelonephritis-related sepsis in  September 2015. She has evidence of rate related left bundle branch block during atrial fibrillation with rapid ventricular response. During sinus rhythm she has a nonspecific intraventricular conduction delay with a QRS duration of around 129 ms and a long PR interval. During atrial fibrillation she had frank left bundle branch block with a QRS duration of 157 ms. She is currently receiving anticoagulation with apixaban, so far without bleeding complications. She does not have a history of stroke or TIA.     Past Medical History  Diagnosis Date  . ICD (implantable cardiac defibrillator) in place     a. s/p initial ICD in 1993 in setting of cardiac arrest;  b. 01/2007 gen change: Guidant T135 Vitality DS VR single lead ICD.  Marland Kitchen Hypertension   . GERD (gastroesophageal reflux disease)     omeprazole  . Diverticulosis of colon (without mention of hemorrhage) 2007    Colonoscopy   . CAD (coronary artery disease)     a. history of cardiac arrest 1993;  b. s/p LAD/LCX stenting in 2003;  c. 07/2011 Cath: LM nl, LAD patent stent, D1 80ost, LCX patent stent, RCA min irregs;  d. 04/2012 MV: EF 66%, no ishcemia;  e. 12/2013 Echo: Ef 55%, no rwma, Gr 1 DD, triv AI/MR, mildly dil LA;  f. 12/2013 Lexi MV: intermediate risk - apical ischemia and inf/infsept fixed defect, ? artifactual.  . H/O hiatal hernia   . Orthostatic hypotension   . Chronic sinus bradycardia   . CKD (chronic kidney disease), stage III   . History of DVT in the past, not on Coumadin now  left  leg  . HYPERCHOLESTEROLEMIA   . Nephrolithiasis, just saw Dr [name]   . EXTERNAL HEMORRHOIDS   . ARTHRITIS   . Esophagitis, unspecified     a. 2012 EGD  . Unspecified gastritis and gastroduodenitis without mention of hemorrhage     a. 2003 EGD->not noted on 2012 EGD.  . Macular degeneration     gets injecton in eye every 5 weeks- last injection - 05/03/2013   . Arthritis   . Peripheral vascular disease ([place])     ???  . Hiatal  hernia     a. 2012 EGD.  Marland Kitchen Esophageal stricture     a. 2012 s/p dil.  . Myocardial infarction ([place]) 1993  . AICD (automatic cardioverter/defibrillator) present   . CAD in native artery 12/31/2013    Previous stents to LAD and Ramus patent on cath today 12/31/13 also There is severe disease in the ostial first diagonal which is unchanged from most recent cardiac catheterization. The right coronary artery could not be engaged selectively but nonselective angiography showed no significant disease in the proximal and midsegment.     . Diabetes mellitus without complication ([place])   . Anxiety   . Cataract     Past Surgical History  Procedure Laterality Date  . Cardiac defibrillator placement  02/05/2007    Guidant  . Hemorroidectomy  80's  . Hysteroscopy w/d&c  09/02/2010    Procedure: DILATATION AND CURETTAGE (D&C) /HYSTEROSCOPY;  Surgeon: Margarette Asal;  Location: [facility];  Service: Gynecology;  Laterality: N/A;  Dilation and Curettage with Hysteroscopy and Polypectomy  . Cardiac catheterization    . Coronary stents     . Cystoscopy with ureteroscopy Right 05/20/2013    Procedure: CYSTOSCOPY, RIGHT URETEROSCOPY STONE EXTRACTION, Insertion of right DOUBLE J STENT ;  Surgeon: Irine Seal, MD;  Location: [facility];  Service: Urology;  Laterality: Right;  . Holmium laser application Right [id number]    Procedure: HOLMIUM LASER APPLICATION;  Surgeon: Irine Seal, MD;  Location: [facility];  Service: Urology;  Laterality: Right;  . Cystoscopy with ureteroscopy and stent placement N/A 06/03/2013    Procedure: SECOND LOOK CYSTOSCOPY WITH URETEROSCOPY  HOLMIUM LASER LITHO AND STONE EXTRACTION Sammie Bench ;  Surgeon: Malka So, MD;  Location: [facility];  Service: Urology;  Laterality: N/A;  . Left heart catheterization with coronary angiogram N/A 07/28/2011    Procedure: LEFT HEART CATHETERIZATION WITH CORONARY ANGIOGRAM;  Surgeon: Lorretta Harp, MD;  Location: [facility] CATH LAB;  Service: Cardiovascular;  Laterality:  N/A;  . Cardiovascular stress test  12/30/2013    abnormal  . Cardiac catheterization  01/01/15    patent stents -there is severe disease in ostial 1st diag but without change.   . Left heart catheterization with coronary angiogram N/A 12/31/2013    Procedure: LEFT HEART CATHETERIZATION WITH CORONARY ANGIOGRAM;  Surgeon: Wellington Hampshire, MD;  Location: [facility];  Service: Cardiovascular;  Laterality: N/A;  . Implantable cardioverter defibrillator (icd) generator change N/A 02/10/2014    Procedure: ICD GENERATOR CHANGE;  Surgeon: Sanda Klein, MD;  Location: [facility];  Service: Cardiovascular;  Laterality: N/A;     Current Outpatient Prescriptions  Medication Sig Dispense Refill  . acetaminophen (TYLENOL) 325 MG tablet Take 650 mg by mouth every 6 (six) hours as needed for mild pain or moderate pain.     Marland Kitchen apixaban (ELIQUIS) 2.5 MG TABS tablet Take 1 tablet (2.5 mg total) by mouth 2 (two) times daily. 180 tablet  2  . aspirin EC 81 MG tablet Take 81 mg by mouth daily.    . Calcium Citrate-Vitamin D (CITRACAL + D PO) Take 1 tablet by mouth daily.    . carboxymethylcellulose (REFRESH PLUS) 0.5 % SOLN Place 1 drop into both eyes 3 (three) times daily as needed (dry eyes).     . Cholecalciferol (VITAMIN D) 1000 UNITS capsule Take 3,000 Units by mouth daily.     . CRESTOR 40 MG tablet TAKE 1 TABLET DAILY 90 tablet 3  . Ferrous Sulfate (IRON) 325 (65 FE) MG TABS Take 325 tablets by mouth daily. 30 each 0  . glucose blood (ACCU-CHEK AVIVA PLUS) test strip Use to check BG once daily.  DX: E11.9 100 each 2  . Lancet Devices (ACCU-CHEK SOFTCLIX) lancets Use to check BG once daily.  Dx:  E11.9 100 each 2  . lisinopril (PRINIVIL,ZESTRIL) 10 MG tablet Take 1 tablet (10 mg total) by mouth 2 (two) times daily. Patient takes doses 12 hours apart at 1000am and 2200pm 60 tablet 3  . metoprolol tartrate (LOPRESSOR) 25 MG tablet Take 1 tablet (25 mg total) by mouth 2 (two) times daily. Patient takes doses  12 hours apart at 1000am and 2200pm 180 tablet 1  . Multiple Vitamins-Minerals (CENTRUM SILVER ULTRA WOMENS PO) Take 1 tablet by mouth daily.    . nitroGLYCERIN (NITROSTAT) 0.4 MG SL tablet Place 1 tablet (0.4 mg total) under the tongue every 5 (five) minutes as needed for chest pain. 25 tablet 4  . NORPACE CR 100 MG 12 hr capsule Take 1 capsule (100 mg total) by mouth 2 (two) times daily. 180 capsule 2  . pantoprazole (PROTONIX) 40 MG tablet Take 1 tablet (40 mg total) by mouth 2 (two) times daily. 60 tablet 3  . PARoxetine (PAXIL) 20 MG tablet Take 1 tablet (20 mg total) by mouth every morning. 90 tablet 0  . torsemide (DEMADEX) 20 MG tablet Take 20 mg by mouth daily as needed (edema).     No current facility-administered medications for this visit.    Allergies:   Adhesive; Actos; Contrast media; Latex; and Lipitor    Social History:  The patient  reports that she has never smoked. She has never used smokeless tobacco. She reports that she does not drink alcohol or use illicit drugs.   Family History:  The patient's family history includes Asthma in her brother; Atrial fibrillation in her sister; Cancer in her daughter; Diabetes in her daughter; Heart attack in her brother and father; Hyperlipidemia in her sister; Macular degeneration in her sister; Osteoporosis in her sister; Other in her father and mother; Stroke in her mother, sister, and sister; Uterine cancer in her sister. There is no history of Colon polyps.    ROS:  Please see the history of present illness.    Otherwise, review of systems positive for none.   All other systems are reviewed and negative.    PHYSICAL EXAM: VS:  BP 142/84 mmHg  Pulse 59  Ht 5\' 3"  (1.6 m)  Wt 176 lb 3.2 oz (79.924 kg)  BMI 31.22 kg/m2 , BMI Body mass index is 31.22 kg/(m^2).  General: Alert, oriented x3, no distress Head: no evidence of trauma, PERRL, EOMI, no exophtalmos or lid lag, no myxedema, no xanthelasma; normal ears, nose and  oropharynx Neck: normal jugular venous pulsations and no hepatojugular reflux; brisk carotid pulses without delay and no carotid bruits Chest: clear to auscultation, no signs of consolidation by percussion or palpation,  normal fremitus, symmetrical and full respiratory excursions Cardiovascular: normal position and quality of the apical impulse, regular rhythm, normal first and second heart sounds, no  murmurs, rubs or gallops Abdomen: no tenderness or distention, no masses by palpation, no abnormal pulsatility or arterial bruits, normal bowel sounds, no hepatosplenomegaly Extremities: no clubbing, cyanosis or edema; 2+ radial, ulnar and brachial pulses bilaterally; 2+ right femoral, posterior tibial and dorsalis pedis pulses; 2+ left femoral, posterior tibial and dorsalis pedis pulses; no subclavian or femoral bruits Neurological: grossly nonfocal Psych: euthymic mood, full affect   EKG:  EKG is ordered today. The ekg ordered today demonstrates NSR, LAFB, QRS 116 ms, QTC 465 ms, generalized flattened T waves   Recent Labs: 06/30/2014: Hemoglobin 12.6; Platelets 266.0 09/04/2014: ALT 13; BUN 15; Creatinine, Ser 1.19*; Potassium 4.6; Sodium 137    Lipid Panel    Component Value Date/Time   CHOL 169 09/04/2014 1146   CHOL 174 06/03/2014 1014   CHOL 140 07/31/2012 1019   TRIG 160* 09/04/2014 1146   TRIG 100 06/03/2014 1014   TRIG 161* 07/31/2012 1019   HDL 61 09/04/2014 1146   HDL 77 06/03/2014 1014   HDL 43 07/31/2012 1019   HDL 50 09/02/2010 0629   CHOLHDL 2.8 09/04/2014 1146   CHOLHDL 2.8 09/02/2010 0629   VLDL 34 09/02/2010 0629   LDLCALC 76 09/04/2014 1146   LDLCALC 65 09/09/2013 0841   LDLCALC 65 07/31/2012 1019   LDLCALC 58 09/02/2010 0629      Wt Readings from Last 3 Encounters:  12/03/14 176 lb 3.2 oz (79.924 kg)  10/21/14 182 lb (82.555 kg)  09/14/14 180 lb (81.647 kg)     ASSESSMENT AND PLAN:  Paroxysmal atrial fibrillation Infrequent. Continue  anticoagulation. Likelihood of recurrence is high and CHADSVasc score is 5. Disopyramide helps reduce the prevalence of events. Continue beta blocker as well.  CAD, s/p LAD and Dx stenting in 2003- patent 2006 and 07/28/11. Residual 80% Dx1  No signs or symptoms of coronary insufficiency or heart failure at this time.   HYPERCHOLESTEROLEMIA  Generally she has excellent lipid profile with exception of borderline hypertriglyceridemia.   Automatic implantable cardioverter-defibrillator in situ  Normal device function. Excellent lead parameters. The device appropriately discriminated the supraventricular mechanism for her tachycardia and the ventricular rate did not really approach therapy zones when in atrial fibrillation.   history of cathecholaminergic polymorphic VT None in years.  She is satisfied with her improved complaints after we lowered the dose of dispyramide.   Current medicines are reviewed at length with the patient today.  The patient does not have concerns regarding medicines.  The following changes have been made:  no change  Labs/ tests ordered today include:  Orders Placed This Encounter  Procedures  . EKG 12-Lead     Patient Instructions  Remote monitoring is used to monitor your ICD from home. This monitoring reduces the number of office visits required to check your device to one time per year. It allows [place] to monitor the functioning of your device to ensure it is working properly. You are scheduled for a device check from home on . February 18, 2017ou may send your transmission at any time that day. If you have a wireless device, the transmission will be sent automatically. After your physician reviews your transmission, you will receive a postcard with your next transmission date.  Dr. Sallyanne Kuster recommends that you schedule a follow-up appointment in: 6 months with ICD check (BS)  Mikael Spray, MD  12/03/2014 9:49 PM    Sanda Klein, MD,  [facility] [phone] office [phone] pager

## 2014-12-03 NOTE — Patient Instructions (Signed)
Remote monitoring is used to monitor your ICD from home. This monitoring reduces the number of office visits required to check your device to one time per year. It allows [place] to monitor the functioning of your device to ensure it is working properly. You are scheduled for a device check from home on . February 18, 2017ou may send your transmission at any time that day. If you have a wireless device, the transmission will be sent automatically. After your physician reviews your transmission, you will receive a postcard with your next transmission date.  Dr. Sallyanne Kuster recommends that you schedule a follow-up appointment in: 6 months with ICD check (BS)

## 2014-12-09 LAB — CUP PACEART INCLINIC DEVICE CHECK
Brady Statistic RV Percent Paced: 1 % — CL
HIGH POWER IMPEDANCE MEASURED VALUE: 54 Ohm
Implantable Lead Implant Date: 20090120
Implantable Lead Location: 753860
Implantable Lead Model: 157
Lead Channel Pacing Threshold Pulse Width: 0.5 ms
MDC IDC LEAD SERIAL: 138237
MDC IDC MSMT BATTERY REMAINING LONGEVITY: 144 mo
MDC IDC MSMT LEADCHNL RA IMPEDANCE VALUE: 872 Ohm
MDC IDC MSMT LEADCHNL RV PACING THRESHOLD AMPLITUDE: 0.7 V
MDC IDC MSMT LEADCHNL RV SENSING INTR AMPL: 18.2 mV
MDC IDC SESS DTM: 20161123152114
Pulse Gen Serial Number: 191956

## 2014-12-21 ENCOUNTER — Encounter: Payer: Self-pay | Admitting: Cardiovascular Disease

## 2014-12-21 DIAGNOSIS — G44219 Episodic tension-type headache, not intractable: Secondary | ICD-10-CM | POA: Diagnosis not present

## 2015-01-01 HISTORY — PX: CARDIAC CATHETERIZATION: SHX172

## 2015-01-05 ENCOUNTER — Other Ambulatory Visit: Payer: Self-pay | Admitting: Cardiovascular Disease

## 2015-01-19 ENCOUNTER — Encounter: Payer: Self-pay | Admitting: Family Medicine

## 2015-01-19 ENCOUNTER — Ambulatory Visit (INDEPENDENT_AMBULATORY_CARE_PROVIDER_SITE_OTHER): Payer: Medicare Other | Admitting: Family Medicine

## 2015-01-19 VITALS — BP 123/68 | HR 57 | Temp 98.1°F | Ht 63.0 in | Wt 173.0 lb

## 2015-01-19 DIAGNOSIS — I472 Ventricular tachycardia: Secondary | ICD-10-CM

## 2015-01-19 DIAGNOSIS — N183 Chronic kidney disease, stage 3 unspecified: Secondary | ICD-10-CM

## 2015-01-19 DIAGNOSIS — N189 Chronic kidney disease, unspecified: Secondary | ICD-10-CM

## 2015-01-19 DIAGNOSIS — E119 Type 2 diabetes mellitus without complications: Secondary | ICD-10-CM

## 2015-01-19 DIAGNOSIS — E7800 Pure hypercholesterolemia, unspecified: Secondary | ICD-10-CM | POA: Diagnosis not present

## 2015-01-19 DIAGNOSIS — N2889 Other specified disorders of kidney and ureter: Secondary | ICD-10-CM

## 2015-01-19 DIAGNOSIS — I2510 Atherosclerotic heart disease of native coronary artery without angina pectoris: Secondary | ICD-10-CM

## 2015-01-19 DIAGNOSIS — K219 Gastro-esophageal reflux disease without esophagitis: Secondary | ICD-10-CM

## 2015-01-19 DIAGNOSIS — N644 Mastodynia: Secondary | ICD-10-CM | POA: Diagnosis not present

## 2015-01-19 DIAGNOSIS — R300 Dysuria: Secondary | ICD-10-CM | POA: Diagnosis not present

## 2015-01-19 DIAGNOSIS — E559 Vitamin D deficiency, unspecified: Secondary | ICD-10-CM

## 2015-01-19 DIAGNOSIS — I10 Essential (primary) hypertension: Secondary | ICD-10-CM | POA: Diagnosis not present

## 2015-01-19 DIAGNOSIS — I480 Paroxysmal atrial fibrillation: Secondary | ICD-10-CM

## 2015-01-19 DIAGNOSIS — I4729 Other ventricular tachycardia: Secondary | ICD-10-CM

## 2015-01-19 DIAGNOSIS — Z95810 Presence of automatic (implantable) cardiac defibrillator: Secondary | ICD-10-CM | POA: Diagnosis not present

## 2015-01-19 LAB — POCT URINALYSIS DIPSTICK
BILIRUBIN UA: NEGATIVE
Glucose, UA: NEGATIVE
Ketones, UA: NEGATIVE
NITRITE UA: NEGATIVE
PH UA: 5
Spec Grav, UA: 1.025
Urobilinogen, UA: NEGATIVE

## 2015-01-19 LAB — POCT UA - MICROSCOPIC ONLY
BACTERIA, U MICROSCOPIC: NEGATIVE
Casts, Ur, LPF, POC: NEGATIVE
Crystals, Ur, HPF, POC: NEGATIVE
Yeast, UA: NEGATIVE

## 2015-01-19 LAB — POCT GLYCOSYLATED HEMOGLOBIN (HGB A1C): HEMOGLOBIN A1C: 6.6

## 2015-01-19 NOTE — Patient Instructions (Addendum)
Medicare Annual Wellness Visit  [facility] and the medical providers at [facility] strive to bring you the best medical care.  In doing so we not only want to address your current medical conditions and concerns but also to detect new conditions early and prevent illness, disease and health-related problems.    Medicare offers a yearly Wellness Visit which allows our clinical staff to assess your need for preventative services including immunizations, lifestyle education, counseling to decrease risk of preventable diseases and screening for fall risk and other medical concerns.    This visit is provided free of charge (no copay) for all Medicare recipients. The clinical pharmacists at [facility] have begun to conduct these Wellness Visits which will also include a thorough review of all your medications.    As you primary medical Al[facility] recommend that you make an appointment for your Annual Wellness Visit if you have not done so already this year.  You may set up this appointment before you leave today or you may call back [phone]) and schedule an appointment.  Please make sure when you call that you mention that you are scheduling your Annual Wellness Visit with the clinical pharmacist so that the appointment may be made for the proper length of time.     Continue current medications. Continue good therapeutic lifestyle changes which include good diet and exercise. Fall precautions discussed with patient. If an FOBT was given today- please return it to our front desk. If you are over 12 years old - you may need Prevnar 7 or the adult Pneumonia vaccine.  **Flu shots are available--- please call and schedule a FLU-CLINIC appointment**  After your visit with [place] today you will receive a survey in the mail or online from [employer] regarding your care with [place]. Please take a moment to fill this out. Your feedback is very  important to [place] as you can help [place] better understand your patient needs as well as improve your experience and satisfaction. WE CARE ABOUT YOU!!!   We'll schedule additional studies of the left breast to make sure that everything is fine even though there is no mass palpable. The patient should continue to be careful not put herself at risk for falling She should follow-up with cardiology and urology as planned

## 2015-01-19 NOTE — Progress Notes (Signed)
Subjective:    Patient ID: [name], female    DOB: Aug 21, 1931, 82 y.o.   MRN: [id number]  HPI Pt here for follow up and management of chronic medical problems which includes diabetes, hypertension, and hyperlipidemia. She is taking medications regularly. Patient is doing well today with no specific complaints. She is followed by cardiology regularly and by urology. She does complain of some left breast pain but had a mammogram in November that was within normal limits. She is due to get lab work today [name] has a DEXA ordered. She is also in need of getting an eye exam. And this was done at [facility] in med and in December. The patient sees a cardiologist yearly and she has her pacemaker checked every 3 months. She does have macular degeneration in both eyes and had an exam by the optometrist at [facility] in December. She sees [name] also for her macular degeneration. The cardiologist visit is every year in December. She also sees the urologist regularly on a yearly basis. She denies any chest pain or shortness of breath. She is currently passing her water without problems and not having any blood in the stool or black tarry bowel movements. She is swallowing her food without problems and there is no nausea or vomiting. She will get lab work done today.      Patient Active Problem List   Diagnosis Date Noted  . Diabetes mellitus type 2, diet-controlled ([place]) 09/14/2014  . ICD (implantable cardioverter-defibrillator) battery depletion 01/20/2014  . Abnormal nuclear stress test, 12/30/13 12/31/2013  . CAD in native artery 12/31/2013  . Unstable angina, neg MI, cath stable.maybe GI 12/30/2013  . Paroxysmal atrial fibrillation, chads2 Vasc2 score of 5, on eliquis 10/16/2013  . Catecholaminergic polymorphic ventricular tachycardia ([place]) 10/16/2013  . Ureteral stone with hydronephrosis 05/20/2013  . Metabolic syndrome 88/41/6606  . Hypotension, meds stopped this admission 07/29/2011  .  Orthostatic hypotension 07/27/2011  . Chest pain, r/o cardiac, negative MI 07/25/2011  . Chronic sinus bradycardia 07/25/2011  . Automatic implantable cardioverter-defibrillator in situ 07/25/2011  . CKD (chronic kidney disease) stage 3, GFR 30-59 ml/min 07/25/2011    Class: Chronic  . History of DVT in the past, on eliquis now 07/25/2011    Class: History of  . Nephrolithiasis, just saw [name]- "OK" 07/25/2011    Class: History of  . DYSPHAGIA 03/24/2010  . Chronic ischemic heart disease 12/03/2007  . RECTAL BLEEDING 12/03/2007  . Diarrhea 12/03/2007  . EPIGASTRIC PAIN 12/03/2007  . Hyperlipidemia 12/02/2007    Class: History of  . EXTERNAL HEMORRHOIDS 12/02/2007    Class: History of  . ESOPHAGITIS 12/02/2007  . GASTRITIS 12/02/2007  . COLITIS 12/02/2007  . DIVERTICULOSIS, COLON 12/02/2007  . ARTHRITIS 12/02/2007    Class: Chronic   Outpatient Encounter Prescriptions as of 01/19/2015  Medication Sig  . acetaminophen (TYLENOL) 325 MG tablet Take 650 mg by mouth every 6 (six) hours as needed for mild pain or moderate pain.   Marland Kitchen apixaban (ELIQUIS) 2.5 MG TABS tablet Take 1 tablet (2.5 mg total) by mouth 2 (two) times daily.  Marland Kitchen aspirin EC 81 MG tablet Take 81 mg by mouth daily.  . Calcium Citrate-Vitamin D (CITRACAL + D PO) Take 1 tablet by mouth daily.  . carboxymethylcellulose (REFRESH PLUS) 0.5 % SOLN Place 1 drop into both eyes 3 (three) times daily as needed (dry eyes).   . Cholecalciferol (VITAMIN D) 1000 UNITS capsule Take 3,000 Units by mouth daily.   Marland Kitchen  CRESTOR 40 MG tablet TAKE 1 TABLET DAILY  . Ferrous Sulfate (IRON) 325 (65 FE) MG TABS Take 325 tablets by mouth daily.  Marland Kitchen glucose blood (ACCU-CHEK AVIVA PLUS) test strip Use to check BG once daily.  DX: E11.9  . Lancet Devices (ACCU-CHEK SOFTCLIX) lancets Use to check BG once daily.  Dx:  E11.9  . lisinopril (PRINIVIL,ZESTRIL) 10 MG tablet Take 1 tablet (10 mg total) by mouth 2 (two) times daily. Patient takes doses 12 hours  apart at 1000am and 2200pm  . metoprolol tartrate (LOPRESSOR) 25 MG tablet Take 1 tablet (25 mg total) by mouth 2 (two) times daily. Patient takes doses 12 hours apart at 1000am and 2200pm  . Multiple Vitamins-Minerals (CENTRUM SILVER ULTRA WOMENS PO) Take 1 tablet by mouth daily.  . nitroGLYCERIN (NITROSTAT) 0.4 MG SL tablet Place 1 tablet (0.4 mg total) under the tongue every 5 (five) minutes as needed for chest pain.  . NORPACE CR 100 MG 12 hr capsule Take 1 capsule (100 mg total) by mouth 2 (two) times daily.  . pantoprazole (PROTONIX) 40 MG tablet Take 1 tablet (40 mg total) by mouth 2 (two) times daily.  Marland Kitchen PARoxetine (PAXIL) 20 MG tablet Take 1 tablet (20 mg total) by mouth every morning.  . torsemide (DEMADEX) 20 MG tablet Take 20 mg by mouth daily as needed (edema).   No facility-administered encounter medications on file as of 01/19/2015.     Review of Systems  Constitutional: Negative.   HENT: Negative.   Eyes: Negative.   Respiratory: Negative.   Cardiovascular: Negative.   Gastrointestinal: Negative.   Endocrine: Negative.   Genitourinary: Negative.   Musculoskeletal: Negative.   Skin: Negative.   Allergic/Immunologic: Negative.   Neurological: Negative.   Hematological: Negative.   Psychiatric/Behavioral: Negative.        Objective:   Physical Exam  Constitutional: She is oriented to person, place, and time. She appears well-developed and well-nourished. No distress.  Patient is pleasant and alert  HENT:  Head: Normocephalic and atraumatic.  Right Ear: External ear normal.  Left Ear: External ear normal.  Nose: Nose normal.  Mouth/Throat: Oropharynx is clear and moist.  Eyes: Conjunctivae and EOM are normal. Pupils are equal, round, and reactive to light. Right eye exhibits no discharge. Left eye exhibits no discharge. No scleral icterus.  Neck: Normal range of motion. Neck supple. No JVD present. No thyromegaly present.  The neck is without bruits or thyromegaly    Cardiovascular: Normal rate, regular rhythm, normal heart sounds and intact distal pulses.   No murmur heard. The heart is regular at 72/m  Pulmonary/Chest: Effort normal and breath sounds normal. No respiratory distress. She has no wheezes. She has no rales. She exhibits no tenderness.  Clear anteriorly and posteriorly  Abdominal: Soft. Bowel sounds are normal. She exhibits no mass. There is no tenderness. There is no rebound and no guarding.  The abdomen is nontender without masses or organ enlargement  Genitourinary:  Patient does have some left breast tenderness in the upper outer quadrant of the left breast and medial left breast. We will schedule a diagnostic mammogram to reevaluate this breast. The patient is aware of this.  Musculoskeletal: Normal range of motion. She exhibits no edema.  Lymphadenopathy:    She has no cervical adenopathy.  Neurological: She is alert and oriented to person, place, and time. She has normal reflexes. No cranial nerve deficit.  Skin: Skin is warm and dry. No rash noted.  Dry skin  Psychiatric: She has a normal mood and affect. Her behavior is normal. Judgment and thought content normal.  Nursing note and vitals reviewed.   BP 123/68 mmHg  Pulse 57  Temp(Src) 98.1 F (36.7 C) (Oral)  Ht _0  (1.6 m)  Wt 173 lb (78.472 kg)  BMI 30.65 kg/m2       Assessment & Plan:  1. Diabetes mellitus type 2, diet-controlled ([place]) -The patient should continue with her aggressive therapeutic lifestyle changes which include diet and exercise as much as possible - POCT glycosylated hemoglobin (Hb A1C) - BMP8+EGFR - CBC with Differential/Platelet  2. Chronic renal insufficiency, stage III (moderate), Scr 1.5 in the past -Continue to avoid NSAIDs - CBC with Differential/Platelet  3. Hyperlipidemia -Continue Crestor and aggressive therapeutic lifestyle changes - CBC with Differential/Platelet - NMR, lipoprofile  4. Essential hypertension -The blood  pressure is good today and she should continue with current treatment - BMP8+EGFR - CBC with Differential/Platelet - Hepatic function panel  5. Vitamin D deficiency -Continue with current vitamin D replacement pending results of lab work - CBC with Differential/Platelet - VITAMIN D 25 Hydroxy (Vit-D Deficiency, Fractures)  6. ASCVD (arteriosclerotic cardiovascular disease) -Continue follow-up with cardiology on a yearly basis as planned - CBC with Differential/Platelet  7. Paroxysmal atrial fibrillation (H[facility]) -The heart was regular today and the patient will continue with her blood thinner. - CBC with Differential/Platelet  8. Gastroesophageal reflux disease, esophagitis presence not specified -She is having no symptoms with reflux and she will continue with her Protonix - CBC with Differential/Platelet  9. CKD (chronic kidney disease) stage 3, GFR 30-59 ml/min -Continue to avoid NSAIDs and keep blood pressure under good control  10. Catecholaminergic polymorphic ventricular tachycardia ([place]) -Continue follow-up with cardiology  11. Automatic implantable cardioverter-defibrillator in situ -Continue with monitoring by phone and regular visits with cardiology  12. Dysuria -She sees the urologist regularly and is not having any symptoms with this today. - POCT urinalysis dipstick - POCT UA - Microscopic Only  13. Breast pain, left -Because of the breasts soreness and tenderness we will get a diagnostic mammogram - MM Digital Diagnostic Unilat L; Future   Patient Instructions                       Medicare Annual Wellness Visit  [facility] and the medical providers at [facility] strive to bring you the best medical care.  In doing so we not only want to address your current medical conditions and concerns but also to detect new conditions early and prevent illness, disease and health-related problems.    Medicare offers a yearly Wellness Visit which  allows our clinical staff to assess your need for preventative services including immunizations, lifestyle education, counseling to decrease risk of preventable diseases and screening for fall risk and other medical concerns.    This visit is provided free of charge (no copay) for all Medicare recipients. The clinical pharmacists at [facility] have begun to conduct these Wellness Visits which will also include a thorough review of all your medications.    As you primary medical provider recommend that you make an appointment for your Annual Wellness Visit if you have not done so already this year.  You may set up this appointment before you leave today or you may call back ([phone]) and schedule an appointment.  Please make sure when you call that you mention that you are scheduling your Annual Wellness Visit  with the clinical pharmacist so that the appointment may be made for the proper length of time.     Continue current medications. Continue good therapeutic lifestyle changes which include good diet and exercise. Fall precautions discussed with patient. If an FOBT was given today- please return it to our front desk. If you are over 77 years old - you may need Prevnar 80 or the adult Pneumonia vaccine.  **Flu shots are available--- please call and schedule a FLU-CLINIC appointment**  After your visit with [place] today you will receive a survey in the mail or online from [employer] regarding your care with [place]. Please take a moment to fill this out. Your feedback is very important to [place] as you can help [place] better understand your patient needs as well as improve your experience and satisfaction. WE CARE ABOUT YOU!!!   We'll schedule additional studies of the left breast to make sure that everything is fine even though there is no mass palpable. The patient should continue to be careful not put herself at risk for falling She should follow-up with cardiology and urology as  planned    Arrie Senate MD

## 2015-01-19 NOTE — Addendum Note (Signed)
Addended by: Pollyann Kennedy F on: 01/19/2015 11:32 AM   Modules accepted: Orders

## 2015-01-20 ENCOUNTER — Telehealth: Payer: Self-pay | Admitting: Family Medicine

## 2015-01-20 LAB — CBC WITH DIFFERENTIAL/PLATELET
BASOS ABS: 0 10*3/uL (ref 0.0–0.2)
Basos: 1 %
EOS (ABSOLUTE): 0.4 10*3/uL (ref 0.0–0.4)
Eos: 7 %
HEMOGLOBIN: 13.5 g/dL (ref 11.1–15.9)
Hematocrit: 40.6 % (ref 34.0–46.6)
IMMATURE GRANS (ABS): 0 10*3/uL (ref 0.0–0.1)
IMMATURE GRANULOCYTES: 0 %
LYMPHS ABS: 1.5 10*3/uL (ref 0.7–3.1)
LYMPHS: 27 %
MCH: 30.1 pg (ref 26.6–33.0)
MCHC: 33.3 g/dL (ref 31.5–35.7)
MCV: 90 fL (ref 79–97)
MONOCYTES: 10 %
Monocytes Absolute: 0.6 10*3/uL (ref 0.1–0.9)
NEUTROS PCT: 55 %
Neutrophils Absolute: 3.1 10*3/uL (ref 1.4–7.0)
Platelets: 280 10*3/uL (ref 150–379)
RBC: 4.49 x10E6/uL (ref 3.77–5.28)
RDW: 14.4 % (ref 12.3–15.4)
WBC: 5.6 10*3/uL (ref 3.4–10.8)

## 2015-01-20 LAB — BMP8+EGFR
BUN/Creatinine Ratio: 15 (ref 11–26)
BUN: 17 mg/dL (ref 8–27)
CALCIUM: 9.5 mg/dL (ref 8.7–10.3)
CHLORIDE: 103 mmol/L (ref 96–106)
CO2: 20 mmol/L (ref 18–29)
Creatinine, Ser: 1.14 mg/dL — ABNORMAL HIGH (ref 0.57–1.00)
GFR, EST AFRICAN AMERICAN: 52 mL/min/{1.73_m2} — AB (ref >59)
GFR, EST NON AFRICAN AMERICAN: 45 mL/min/{1.73_m2} — AB (ref >59)
Glucose: 127 mg/dL — ABNORMAL HIGH (ref 65–99)
Potassium: 5 mmol/L (ref 3.5–5.2)
Sodium: 138 mmol/L (ref 134–144)

## 2015-01-20 LAB — HEPATIC FUNCTION PANEL
ALBUMIN: 4.2 g/dL (ref 3.5–4.7)
ALK PHOS: 72 IU/L (ref 39–117)
ALT: 19 IU/L (ref 0–32)
AST: 19 IU/L (ref 0–40)
Bilirubin Total: 0.7 mg/dL (ref 0.0–1.2)
Bilirubin, Direct: 0.19 mg/dL (ref 0.00–0.40)
TOTAL PROTEIN: 6.8 g/dL (ref 6.0–8.5)

## 2015-01-20 LAB — NMR, LIPOPROFILE
Cholesterol: 159 mg/dL (ref 100–199)
HDL Cholesterol by NMR: 59 mg/dL (ref >39)
HDL Particle Number: 37.3 umol/L (ref >=30.5)
LDL Particle Number: 923 nmol/L (ref <1000)
LDL SIZE: 20.7 nm (ref >20.5)
LDL-C: 70 mg/dL (ref 0–99)
LP-IR Score: 52 — ABNORMAL HIGH (ref <=45)
SMALL LDL PARTICLE NUMBER: 464 nmol/L (ref <=527)
TRIGLYCERIDES BY NMR: 148 mg/dL (ref 0–149)

## 2015-01-20 LAB — VITAMIN D 25 HYDROXY (VIT D DEFICIENCY, FRACTURES): Vit D, 25-Hydroxy: 29 ng/mL — ABNORMAL LOW (ref 30.0–100.0)

## 2015-01-20 NOTE — Telephone Encounter (Signed)
Stp and reviewed results that were back. Pt states she has something else that she needed to speak to you about and wouldn't give me anymore info.

## 2015-01-20 NOTE — Telephone Encounter (Signed)
Pt called

## 2015-01-21 LAB — URINE CULTURE

## 2015-01-21 MED ORDER — AMOXICILLIN 500 MG PO CAPS: 500.0000 mg | ORAL_CAPSULE | Freq: Three times a day (TID) | ORAL | Status: DC

## 2015-01-21 NOTE — Addendum Note (Signed)
Addended by: Shelbie Ammons on: 01/21/2015 04:00 PM   Modules accepted: Orders

## 2015-01-26 ENCOUNTER — Other Ambulatory Visit: Payer: Self-pay | Admitting: Family Medicine

## 2015-02-03 ENCOUNTER — Encounter: Payer: Self-pay | Admitting: Family Medicine

## 2015-02-03 ENCOUNTER — Telehealth: Payer: Self-pay | Admitting: Family Medicine

## 2015-02-03 ENCOUNTER — Ambulatory Visit (INDEPENDENT_AMBULATORY_CARE_PROVIDER_SITE_OTHER): Payer: Medicare Other | Admitting: Family Medicine

## 2015-02-03 VITALS — BP 131/84 | HR 60 | Temp 97.2°F | Ht 63.0 in | Wt 177.4 lb

## 2015-02-03 DIAGNOSIS — N3000 Acute cystitis without hematuria: Secondary | ICD-10-CM

## 2015-02-03 DIAGNOSIS — R300 Dysuria: Secondary | ICD-10-CM

## 2015-02-03 DIAGNOSIS — I2510 Atherosclerotic heart disease of native coronary artery without angina pectoris: Secondary | ICD-10-CM

## 2015-02-03 LAB — POCT URINALYSIS DIPSTICK
BILIRUBIN UA: NEGATIVE
GLUCOSE UA: NEGATIVE
Ketones, UA: NEGATIVE
NITRITE UA: NEGATIVE
Spec Grav, UA: 1.01
UROBILINOGEN UA: NEGATIVE
pH, UA: 5

## 2015-02-03 LAB — POCT UA - MICROSCOPIC ONLY
CASTS, UR, LPF, POC: NEGATIVE
CRYSTALS, UR, HPF, POC: NEGATIVE
Mucus, UA: NEGATIVE
YEAST UA: NEGATIVE

## 2015-02-03 MED ORDER — NITROFURANTOIN MONOHYD MACRO 100 MG PO CAPS: 100.0000 mg | ORAL_CAPSULE | Freq: Two times a day (BID) | ORAL | Status: DC

## 2015-02-03 NOTE — Telephone Encounter (Signed)
Patient is still currently on antibiotic for UTI and still having a lot of burning when urinates. Patient advised that she should be seen. Patient given an appointment for this afternoon with [facility].

## 2015-02-03 NOTE — Progress Notes (Signed)
BP 131/84 mmHg  Pulse 60  Temp(Src) 97.2 F (36.2 C) (Oral)  Ht 5\' 3"  (1.6 m)  Wt 177 lb 6.4 oz (80.468 kg)  BMI 31.43 kg/m2   Subjective:    Patient ID: [name], female    DOB: [birth year], 82 y.o.   MRN: [id number]  HPI: [name] is a 82 y.o. female presenting on 02/03/2015 for Urinary Tract Infection   HPI Dysuria Patient has been having dysuria for the past 2 days that has been increasing along with frequency and odor in her urine. She denies any fevers or chills. She did just finish a course of amoxicillin for UTI yesterday and it did not seem to fully clear. She does have a history of renal stones but does not have any of the flank tenderness or severe pain that she usually has when she has problems with stones.  Relevant past medical, surgical, family and social history reviewed and updated as indicated. Interim medical history since our last visit reviewed. Allergies and medications reviewed and updated.  Review of Systems  Constitutional: Negative for fever and chills.  HENT: Negative for congestion, ear discharge and ear pain.   Eyes: Negative for redness and visual disturbance.  Respiratory: Negative for chest tightness and shortness of breath.   Cardiovascular: Negative for chest pain and leg swelling.  Gastrointestinal: Positive for abdominal pain.  Genitourinary: Positive for dysuria, urgency and frequency. Negative for flank pain and difficulty urinating.  Musculoskeletal: Negative for back pain and gait problem.  Skin: Negative for rash.  Neurological: Negative for light-headedness and headaches.  Psychiatric/Behavioral: Negative for behavioral problems and agitation.  All other systems reviewed and are negative.   Per HPI unless specifically indicated above     Medication List       This list is accurate as of: 02/03/15  2:58 PM.  Always use your most recent med list.               accu-chek softclix lancets  Use to check BG once  daily.  Dx:  E11.9     acetaminophen 325 MG tablet  Commonly known as:  TYLENOL  Take 650 mg by mouth every 6 (six) hours as needed for mild pain or moderate pain.     apixaban 2.5 MG Tabs tablet  Commonly known as:  ELIQUIS  Take 1 tablet (2.5 mg total) by mouth 2 (two) times daily.     carboxymethylcellulose 0.5 % Soln  Commonly known as:  REFRESH PLUS  Place 1 drop into both eyes 3 (three) times daily as needed (dry eyes).     CENTRUM SILVER ULTRA WOMENS PO  Take 1 tablet by mouth daily.     CITRACAL + D PO  Take 1 tablet by mouth daily.     CRESTOR 40 MG tablet  Generic drug:  rosuvastatin  TAKE 1 TABLET DAILY     glucose blood test strip  Commonly known as:  ACCU-CHEK AVIVA PLUS  Use to check BG once daily.  DX: E11.9     Iron 325 (65 Fe) MG Tabs  Take 325 tablets by mouth daily.     lisinopril 10 MG tablet  Commonly known as:  PRINIVIL,ZESTRIL  Take 1 tablet (10 mg total) by mouth 2 (two) times daily. Patient takes doses 12 hours apart at 1000am and 2200pm     lisinopril 10 MG tablet  Commonly known as:  PRINIVIL,ZESTRIL  TAKE 1 TABLET EVERY 12 HOURS  metoprolol tartrate 25 MG tablet  Commonly known as:  LOPRESSOR  Take 1 tablet (25 mg total) by mouth 2 (two) times daily. Patient takes doses 12 hours apart at 1000am and 2200pm     nitrofurantoin (macrocrystal-monohydrate) 100 MG capsule  Commonly known as:  MACROBID  Take 1 capsule (100 mg total) by mouth 2 (two) times daily. 1 po BId     nitroGLYCERIN 0.4 MG SL tablet  Commonly known as:  NITROSTAT  Place 1 tablet (0.4 mg total) under the tongue every 5 (five) minutes as needed for chest pain.     NORPACE CR 100 MG 12 hr capsule  Generic drug:  disopyramide  Take 1 capsule (100 mg total) by mouth 2 (two) times daily.     pantoprazole 40 MG tablet  Commonly known as:  PROTONIX  Take 1 tablet (40 mg total) by mouth 2 (two) times daily.     PARoxetine 20 MG tablet  Commonly known as:  PAXIL  Take 1  tablet (20 mg total) by mouth every morning.     torsemide 20 MG tablet  Commonly known as:  DEMADEX  Take 20 mg by mouth daily as needed (edema).     Vitamin D 1000 units capsule  Take 3,000 Units by mouth daily.           Objective:    BP 131/84 mmHg  Pulse 60  Temp(Src) 97.2 F (36.2 C) (Oral)  Ht 5\' 3"  (1.6 m)  Wt 177 lb 6.4 oz (80.468 kg)  BMI 31.43 kg/m2  Wt Readings from Last 3 Encounters:  02/03/15 177 lb 6.4 oz (80.468 kg)  01/19/15 173 lb (78.472 kg)  12/03/14 176 lb 3.2 oz (79.924 kg)    Physical Exam  Constitutional: She is oriented to person, place, and time. She appears well-developed and well-nourished. No distress.  Eyes: Conjunctivae and EOM are normal.  Cardiovascular: Normal rate, regular rhythm, normal heart sounds and intact distal pulses.   No murmur heard. Pulmonary/Chest: Effort normal and breath sounds normal. No respiratory distress. She has no wheezes.  Abdominal: Soft. Bowel sounds are normal. She exhibits no distension. There is tenderness in the right lower quadrant and suprapubic area. There is no rigidity, no rebound, no guarding, no CVA tenderness, no tenderness at McBurney's point and negative Murphy's sign.  Musculoskeletal: Normal range of motion. She exhibits no edema or tenderness.  Neurological: She is alert and oriented to person, place, and time. Coordination normal.  Skin: Skin is warm and dry. No rash noted. She is not diaphoretic.  Psychiatric: She has a normal mood and affect. Her behavior is normal.  Nursing note and vitals reviewed.   Results for orders placed or performed in visit on 02/03/15  POCT UA - Microscopic Only  Result Value Ref Range   WBC, Ur, HPF, POC 80-100    RBC, urine, microscopic 5-10    Bacteria, U Microscopic many    Mucus, UA neg    Epithelial cells, urine per micros occ    Crystals, Ur, HPF, POC neg    Casts, Ur, LPF, POC neg    Yeast, UA neg   POCT urinalysis dipstick  Result Value Ref Range    Color, UA yellow    Clarity, UA clear    Glucose, UA neg    Bilirubin, UA neg    Ketones, UA neg    Spec Grav, UA 1.010    Blood, UA mod    pH, UA 5.0  Protein, UA 2+    Urobilinogen, UA negative    Nitrite, UA neg    Leukocytes, UA large (3+) (A) Negative      Assessment & Plan:       Problem List Items Addressed This Visit    None    Visit Diagnoses    Dysuria    -  Primary    Relevant Medications    nitrofurantoin, macrocrystal-monohydrate, (MACROBID) 100 MG capsule    Other Relevant Orders    POCT UA - Microscopic Only (Completed)    POCT urinalysis dipstick (Completed)    Urine culture    Acute cystitis without hematuria        Relevant Medications    nitrofurantoin, macrocrystal-monohydrate, (MACROBID) 100 MG capsule    Other Relevant Orders    Urine culture        Follow up plan: Return if symptoms worsen or fail to improve.  Counseling provided for all of the vaccine components Orders Placed This Encounter  Procedures  . Urine culture  . POCT UA - Microscopic Only  . POCT urinalysis dipstick    Caryl Pina, MD [facility] 02/03/2015, 2:58 PM

## 2015-02-10 ENCOUNTER — Other Ambulatory Visit: Payer: Self-pay | Admitting: Family Medicine

## 2015-02-10 DIAGNOSIS — N644 Mastodynia: Secondary | ICD-10-CM

## 2015-02-11 ENCOUNTER — Telehealth: Payer: Self-pay | Admitting: Family Medicine

## 2015-02-12 NOTE — Telephone Encounter (Signed)
Stp and advised the urine wasn't sent out for culture but could come in next week and give a sample to send off. Pt voiced understanding.

## 2015-02-18 DIAGNOSIS — H353122 Nonexudative age-related macular degeneration, left eye, intermediate dry stage: Secondary | ICD-10-CM | POA: Diagnosis not present

## 2015-02-18 DIAGNOSIS — H353221 Exudative age-related macular degeneration, left eye, with active choroidal neovascularization: Secondary | ICD-10-CM | POA: Diagnosis not present

## 2015-02-18 DIAGNOSIS — H43813 Vitreous degeneration, bilateral: Secondary | ICD-10-CM | POA: Diagnosis not present

## 2015-02-18 DIAGNOSIS — H353212 Exudative age-related macular degeneration, right eye, with inactive choroidal neovascularization: Secondary | ICD-10-CM | POA: Diagnosis not present

## 2015-02-22 ENCOUNTER — Telehealth: Payer: Self-pay | Admitting: Family Medicine

## 2015-02-22 NOTE — Telephone Encounter (Signed)
Patient aware that she should come and leave culture.

## 2015-02-25 ENCOUNTER — Other Ambulatory Visit: Payer: Self-pay | Admitting: Cardiovascular Disease

## 2015-02-25 ENCOUNTER — Other Ambulatory Visit: Payer: Self-pay | Admitting: Family Medicine

## 2015-02-25 NOTE — Telephone Encounter (Signed)
Rx request sent to pharmacy.  

## 2015-02-27 ENCOUNTER — Other Ambulatory Visit: Payer: Medicare Other

## 2015-02-27 DIAGNOSIS — R300 Dysuria: Secondary | ICD-10-CM | POA: Diagnosis not present

## 2015-02-27 DIAGNOSIS — N3000 Acute cystitis without hematuria: Secondary | ICD-10-CM | POA: Diagnosis not present

## 2015-02-27 NOTE — Progress Notes (Signed)
Lab only 

## 2015-03-01 ENCOUNTER — Telehealth: Payer: Self-pay | Admitting: Family Medicine

## 2015-03-01 LAB — URINE CULTURE

## 2015-03-01 MED ORDER — LEVOFLOXACIN 500 MG PO TABS: 500.0000 mg | ORAL_TABLET | Freq: Every day | ORAL | Status: DC

## 2015-03-01 NOTE — Telephone Encounter (Signed)
Patient had a bacteria in her urine that was resistant to nitrofurantoin, sent Levaquin instead. Please notify patient to stop nitrofurantoin and start Levaquin Caryl Pina, MD [facility] 03/01/2015, 6:15 PM

## 2015-03-02 ENCOUNTER — Other Ambulatory Visit: Payer: Medicare Other

## 2015-03-02 DIAGNOSIS — Z1212 Encounter for screening for malignant neoplasm of rectum: Secondary | ICD-10-CM

## 2015-03-02 NOTE — Telephone Encounter (Signed)
Patient advised. She will let us know if she has any problems.

## 2015-03-04 ENCOUNTER — Telehealth: Payer: Self-pay | Admitting: Family Medicine

## 2015-03-04 LAB — FECAL OCCULT BLOOD, IMMUNOCHEMICAL: FECAL OCCULT BLD: NEGATIVE

## 2015-03-08 ENCOUNTER — Ambulatory Visit (INDEPENDENT_AMBULATORY_CARE_PROVIDER_SITE_OTHER): Payer: Medicare Other | Admitting: *Deleted

## 2015-03-08 ENCOUNTER — Telehealth: Payer: Self-pay | Admitting: Family Medicine

## 2015-03-08 DIAGNOSIS — I429 Cardiomyopathy, unspecified: Secondary | ICD-10-CM

## 2015-03-09 NOTE — Telephone Encounter (Signed)
Patient advised that she should follow up with urologist.

## 2015-03-10 NOTE — Progress Notes (Signed)
Remote ICD transmission.   

## 2015-03-15 ENCOUNTER — Other Ambulatory Visit (INDEPENDENT_AMBULATORY_CARE_PROVIDER_SITE_OTHER): Payer: Medicare Other

## 2015-03-15 DIAGNOSIS — R300 Dysuria: Secondary | ICD-10-CM

## 2015-03-15 DIAGNOSIS — R350 Frequency of micturition: Secondary | ICD-10-CM

## 2015-03-15 LAB — POCT URINALYSIS DIPSTICK
BILIRUBIN UA: NEGATIVE
GLUCOSE UA: NEGATIVE
Ketones, UA: NEGATIVE
Nitrite, UA: NEGATIVE
SPEC GRAV UA: 1.025
UROBILINOGEN UA: NEGATIVE
pH, UA: 5

## 2015-03-15 LAB — POCT UA - MICROSCOPIC ONLY
CASTS, UR, LPF, POC: NEGATIVE
CRYSTALS, UR, HPF, POC: NEGATIVE
MUCUS UA: NEGATIVE
Yeast, UA: NEGATIVE

## 2015-03-15 MED ORDER — LEVOFLOXACIN 250 MG PO TABS: 250.0000 mg | ORAL_TABLET | Freq: Every day | ORAL | Status: DC

## 2015-03-17 LAB — URINE CULTURE

## 2015-03-17 NOTE — Addendum Note (Signed)
Addended by: Michaela Corner on: 03/17/2015 10:18 AM   Modules accepted: Orders

## 2015-03-21 ENCOUNTER — Encounter: Payer: Self-pay | Admitting: Cardiology

## 2015-03-21 LAB — CUP PACEART REMOTE DEVICE CHECK
Battery Remaining Percentage: 100 %
Brady Statistic RV Percent Paced: 0 %
Date Time Interrogation Session: 20170220054200
HighPow Impedance: 53 Ohm
Implantable Lead Implant Date: 20090120
Implantable Lead Model: 157
Implantable Lead Serial Number: 138237
Lead Channel Impedance Value: 858 Ohm
Lead Channel Pacing Threshold Amplitude: 0.7 V
Lead Channel Pacing Threshold Pulse Width: 0.5 ms
MDC IDC LEAD LOCATION: 753860
MDC IDC MSMT BATTERY REMAINING LONGEVITY: 144 mo
MDC IDC SET LEADCHNL RV PACING AMPLITUDE: 2.2 V
MDC IDC SET LEADCHNL RV PACING PULSEWIDTH: 0.5 ms
MDC IDC SET LEADCHNL RV SENSING SENSITIVITY: 0.6 mV
Pulse Gen Serial Number: 191956

## 2015-03-21 NOTE — Progress Notes (Signed)
Normal remote reviewed.  Next follow up 05/2015 in clinic.  

## 2015-03-22 ENCOUNTER — Ambulatory Visit
Admission: RE | Admit: 2015-03-22 | Discharge: 2015-03-22 | Disposition: A | Discharge status: Home / Self Care | Payer: Medicare Other | Source: Ambulatory Visit | Attending: Family Medicine | Admitting: Family Medicine

## 2015-03-22 DIAGNOSIS — N644 Mastodynia: Secondary | ICD-10-CM

## 2015-03-26 ENCOUNTER — Other Ambulatory Visit: Payer: Self-pay | Admitting: Family Medicine

## 2015-04-06 ENCOUNTER — Other Ambulatory Visit: Payer: Medicare Other

## 2015-04-06 ENCOUNTER — Other Ambulatory Visit: Payer: Self-pay | Admitting: *Deleted

## 2015-04-06 DIAGNOSIS — R300 Dysuria: Secondary | ICD-10-CM

## 2015-04-06 LAB — MICROSCOPIC EXAMINATION: Renal Epithel, UA: >10 /hpf — AB

## 2015-04-06 LAB — URINALYSIS, COMPLETE
BILIRUBIN UA: NEGATIVE
Glucose, UA: NEGATIVE
Ketones, UA: NEGATIVE
NITRITE UA: NEGATIVE
PH UA: 5.5 (ref 5.0–7.5)
PROTEIN UA: NEGATIVE
RBC UA: NEGATIVE
Specific Gravity, UA: 1.01 (ref 1.005–1.030)
UUROB: 0.2 mg/dL (ref 0.2–1.0)

## 2015-04-07 ENCOUNTER — Encounter: Payer: Self-pay | Admitting: Cardiology

## 2015-04-07 LAB — URINE CULTURE

## 2015-04-09 ENCOUNTER — Other Ambulatory Visit: Payer: Self-pay | Admitting: Urology

## 2015-04-09 ENCOUNTER — Ambulatory Visit (INDEPENDENT_AMBULATORY_CARE_PROVIDER_SITE_OTHER): Payer: Medicare Other | Admitting: Urology

## 2015-04-09 DIAGNOSIS — N200 Calculus of kidney: Secondary | ICD-10-CM

## 2015-04-09 DIAGNOSIS — R1011 Right upper quadrant pain: Secondary | ICD-10-CM

## 2015-04-09 DIAGNOSIS — N3020 Other chronic cystitis without hematuria: Secondary | ICD-10-CM | POA: Diagnosis not present

## 2015-04-12 DIAGNOSIS — M1711 Unilateral primary osteoarthritis, right knee: Secondary | ICD-10-CM | POA: Diagnosis not present

## 2015-04-12 DIAGNOSIS — M25561 Pain in right knee: Secondary | ICD-10-CM | POA: Diagnosis not present

## 2015-04-13 ENCOUNTER — Ambulatory Visit (HOSPITAL_COMMUNITY): Admission: RE | Admit: 2015-04-13 | Payer: Medicare Other | Source: Ambulatory Visit

## 2015-04-13 ENCOUNTER — Ambulatory Visit (HOSPITAL_COMMUNITY)
Admission: RE | Admit: 2015-04-13 | Discharge: 2015-04-13 | Disposition: A | Discharge status: Home / Self Care | Payer: Medicare Other | Source: Ambulatory Visit | Attending: Urology | Admitting: Urology

## 2015-04-13 DIAGNOSIS — N29 Other disorders of kidney and ureter in diseases classified elsewhere: Secondary | ICD-10-CM | POA: Insufficient documentation

## 2015-04-13 DIAGNOSIS — N200 Calculus of kidney: Secondary | ICD-10-CM | POA: Insufficient documentation

## 2015-04-14 ENCOUNTER — Ambulatory Visit (HOSPITAL_COMMUNITY): Payer: Medicare Other

## 2015-04-23 ENCOUNTER — Ambulatory Visit (INDEPENDENT_AMBULATORY_CARE_PROVIDER_SITE_OTHER): Payer: Medicare Other | Admitting: Urology

## 2015-04-23 DIAGNOSIS — K5909 Other constipation: Secondary | ICD-10-CM | POA: Diagnosis not present

## 2015-04-23 DIAGNOSIS — N3020 Other chronic cystitis without hematuria: Secondary | ICD-10-CM | POA: Diagnosis not present

## 2015-04-23 DIAGNOSIS — N390 Urinary tract infection, site not specified: Secondary | ICD-10-CM

## 2015-04-23 DIAGNOSIS — M545 Low back pain: Secondary | ICD-10-CM | POA: Diagnosis not present

## 2015-05-07 ENCOUNTER — Ambulatory Visit (INDEPENDENT_AMBULATORY_CARE_PROVIDER_SITE_OTHER): Payer: Medicare Other | Admitting: Urology

## 2015-05-07 DIAGNOSIS — N390 Urinary tract infection, site not specified: Secondary | ICD-10-CM

## 2015-05-07 DIAGNOSIS — N952 Postmenopausal atrophic vaginitis: Secondary | ICD-10-CM | POA: Diagnosis not present

## 2015-05-07 DIAGNOSIS — N200 Calculus of kidney: Secondary | ICD-10-CM

## 2015-05-19 ENCOUNTER — Other Ambulatory Visit: Payer: Self-pay | Admitting: Family Medicine

## 2015-05-20 ENCOUNTER — Encounter: Payer: Self-pay | Admitting: Cardiovascular Disease

## 2015-05-20 ENCOUNTER — Ambulatory Visit (INDEPENDENT_AMBULATORY_CARE_PROVIDER_SITE_OTHER): Payer: Medicare Other | Admitting: Cardiovascular Disease

## 2015-05-20 VITALS — BP 176/90 | HR 56 | Ht 63.0 in | Wt 173.4 lb

## 2015-05-20 DIAGNOSIS — I480 Paroxysmal atrial fibrillation: Secondary | ICD-10-CM

## 2015-05-20 DIAGNOSIS — I951 Orthostatic hypotension: Secondary | ICD-10-CM

## 2015-05-20 DIAGNOSIS — I472 Ventricular tachycardia: Secondary | ICD-10-CM | POA: Diagnosis not present

## 2015-05-20 DIAGNOSIS — Z95810 Presence of automatic (implantable) cardiac defibrillator: Secondary | ICD-10-CM

## 2015-05-20 DIAGNOSIS — R55 Syncope and collapse: Secondary | ICD-10-CM

## 2015-05-20 DIAGNOSIS — N183 Chronic kidney disease, stage 3 unspecified: Secondary | ICD-10-CM

## 2015-05-20 DIAGNOSIS — E119 Type 2 diabetes mellitus without complications: Secondary | ICD-10-CM

## 2015-05-20 DIAGNOSIS — E7800 Pure hypercholesterolemia, unspecified: Secondary | ICD-10-CM

## 2015-05-20 DIAGNOSIS — I2510 Atherosclerotic heart disease of native coronary artery without angina pectoris: Secondary | ICD-10-CM

## 2015-05-20 DIAGNOSIS — R001 Bradycardia, unspecified: Secondary | ICD-10-CM

## 2015-05-20 DIAGNOSIS — I4729 Other ventricular tachycardia: Secondary | ICD-10-CM

## 2015-05-20 NOTE — Progress Notes (Signed)
Patient ID: [name], female   DOB: 12-Sep-1931, 82 y.o.   MRN: [id number]    Cardiology Office Note    Date:  05/22/2015   ID:  [name], DOB August 08, 1931, MRN [id number]  PCP:  Redge Gainer, MD  Cardiologist:   Sanda Klein, MD   Chief Complaint  Patient presents with  . Follow-up    pacer check    History of Present Illness:  [name] is a 82 y.o. female with a history of catecholamine allergic polymorphic VT status post single-chamber ICD (initial implantation 1993, new ventricular lead 2009, last generator change 2016, [employer]), history of infrequent paroxysmal atrial fibrillation (1993 and 2015), subsequent problems with coronary artery disease requiring stents (LAD and left circumflex 2003, no revascularization since, taking stents at coronary angiography in 2006, 2013 in late 2015), normal left ventricular systolic function, orthostatic hypotension, hyperlipidemia, type 2 diabetes mellitus, stage III chronic kidney disease presents with a complaint of dizziness and near syncope.   This occurred while she was at a funeral outside in hot weather. She had not had enough to eat or drink that day. He felt very hot and flushed and had some palpitations. One other person at the grave site actually lost consciousness. She felt very weak but did not have full loss of consciousness. Over the last 3 months she has received numerous rounds of antibiotics for urinary tract infection. Among these was levofloxacin.  Interrogation of her defibrillator shows no evidence of recent arrhythmia. Her estimated generator longevity is 12 years. She never requires ventricular pacing. They have not even been episodes of nonsustained ventricular tachycardia.  As in the past, she reports that she always felt better when she was able to obtain the brand-name sustained-release release Norpace CR and has more dizziness when taking the disopyramide. She seems to have more problems with  orthostatic hypotension and dry mouth from the immediate release preparation than she did with the sustained release.  She has a history of rate related left bundle branch block. When she is in sinus rhythm she has a nonspecific intraventricular conduction delay with a QRS duration of around 120 ms and a long AV conduction time. During atrial fibrillation with rapid response she had frank left bundle branch block with a QRS duration of 157 ms  Past Medical History  Diagnosis Date  . ICD (implantable cardiac defibrillator) in place     a. s/p initial ICD in 1993 in setting of cardiac arrest;  b. 01/2007 gen change: Guidant T135 Vitality DS VR single lead ICD.  Marland Kitchen Hypertension   . GERD (gastroesophageal reflux disease)     omeprazole  . Diverticulosis of colon (without mention of hemorrhage) 2007    Colonoscopy   . CAD (coronary artery disease)     a. history of cardiac arrest 1993;  b. s/p LAD/LCX stenting in 2003;  c. 07/2011 Cath: LM nl, LAD patent stent, D1 80ost, LCX patent stent, RCA min irregs;  d. 04/2012 MV: EF 66%, no ishcemia;  e. 12/2013 Echo: Ef 55%, no rwma, Gr 1 DD, triv AI/MR, mildly dil LA;  f. 12/2013 Lexi MV: intermediate risk - apical ischemia and inf/infsept fixed defect, ? artifactual.  . H/O hiatal hernia   . Orthostatic hypotension   . Chronic sinus bradycardia   . CKD (chronic kidney disease), stage III   . History of DVT in the past, not on Coumadin now     left  leg  . HYPERCHOLESTEROLEMIA   .  Nephrolithiasis, just saw Dr [name]   . EXTERNAL HEMORRHOIDS   . ARTHRITIS   . Esophagitis, unspecified     a. 2012 EGD  . Unspecified gastritis and gastroduodenitis without mention of hemorrhage     a. 2003 EGD->not noted on 2012 EGD.  . Macular degeneration     gets injecton in eye every 5 weeks- last injection - 05/03/2013   . Arthritis   . Peripheral vascular disease ([place])     ???  . Hiatal hernia     a. 2012 EGD.  Marland Kitchen Esophageal stricture     a. 2012 s/p dil.  .  Myocardial infarction ([place]) 1993  . AICD (automatic cardioverter/defibrillator) present   . CAD in native artery 12/31/2013    Previous stents to LAD and Ramus patent on cath today 12/31/13 also There is severe disease in the ostial first diagonal which is unchanged from most recent cardiac catheterization. The right coronary artery could not be engaged selectively but nonselective angiography showed no significant disease in the proximal and midsegment.     . Diabetes mellitus without complication ([place])   . Anxiety   . Cataract     Past Surgical History  Procedure Laterality Date  . Cardiac defibrillator placement  02/05/2007    Guidant  . Hemorroidectomy  80's  . Hysteroscopy w/d&c  09/02/2010    Procedure: DILATATION AND CURETTAGE (D&C) /HYSTEROSCOPY;  Surgeon: Margarette Asal;  Location: [facility];  Service: Gynecology;  Laterality: N/A;  Dilation and Curettage with Hysteroscopy and Polypectomy  . Cardiac catheterization    . Coronary stents     . Cystoscopy with ureteroscopy Right 05/20/2013    Procedure: CYSTOSCOPY, RIGHT URETEROSCOPY STONE EXTRACTION, Insertion of right DOUBLE J STENT ;  Surgeon: Irine Seal, MD;  Location: [facility];  Service: Urology;  Laterality: Right;  . Holmium laser application Right [id number]    Procedure: HOLMIUM LASER APPLICATION;  Surgeon: Irine Seal, MD;  Location: [facility];  Service: Urology;  Laterality: Right;  . Cystoscopy with ureteroscopy and stent placement N/A 06/03/2013    Procedure: SECOND LOOK CYSTOSCOPY WITH URETEROSCOPY  HOLMIUM LASER LITHO AND STONE EXTRACTION Sammie Bench ;  Surgeon: Malka So, MD;  Location: [facility];  Service: Urology;  Laterality: N/A;  . Left heart catheterization with coronary angiogram N/A 07/28/2011    Procedure: LEFT HEART CATHETERIZATION WITH CORONARY ANGIOGRAM;  Surgeon: Lorretta Harp, MD;  Location: [facility];  Service: Cardiovascular;  Laterality: N/A;  . Cardiovascular stress test  12/30/2013    abnormal  . Cardiac  catheterization  01/01/15    patent stents -there is severe disease in ostial 1st diag but without change.   . Left heart catheterization with coronary angiogram N/A 12/31/2013    Procedure: LEFT HEART CATHETERIZATION WITH CORONARY ANGIOGRAM;  Surgeon: Wellington Hampshire, MD;  Location: [facility];  Service: Cardiovascular;  Laterality: N/A;  . Implantable cardioverter defibrillator (icd) generator change N/A 02/10/2014    Procedure: ICD GENERATOR CHANGE;  Surgeon: Sanda Klein, MD;  Location: [facility];  Service: Cardiovascular;  Laterality: N/A;    Current Medications: Outpatient Prescriptions Prior to Visit  Medication Sig Dispense Refill  . acetaminophen (TYLENOL) 325 MG tablet Take 650 mg by mouth every 6 (six) hours as needed for mild pain or moderate pain.     . Calcium Citrate-Vitamin D (CITRACAL + D PO) Take 1 tablet by mouth daily.    . carboxymethylcellulose (REFRESH PLUS) 0.5 % SOLN Place 1 drop  into both eyes 3 (three) times daily as needed (dry eyes).     . Cholecalciferol (VITAMIN D) 1000 UNITS capsule Take 3,000 Units by mouth daily.     . CRESTOR 40 MG tablet TAKE 1 TABLET DAILY 90 tablet 3  . disopyramide (NORPACE) 100 MG capsule TAKE (2) CAPSULES TWICE DAILY. 120 capsule 0  . ELIQUIS 2.5 MG TABS tablet TAKE  (1)  TABLET TWICE A DAY. 180 tablet 0  . Ferrous Sulfate (IRON) 325 (65 FE) MG TABS Take 325 tablets by mouth daily. 30 each 0  . glucose blood (ACCU-CHEK AVIVA PLUS) test strip Use to check BG once daily.  DX: E11.9 100 each 2  . Lancet Devices (ACCU-CHEK SOFTCLIX) lancets Use to check BG once daily.  Dx:  E11.9 100 each 2  . levofloxacin (LEVAQUIN) 250 MG tablet Take 1 tablet (250 mg total) by mouth daily. 7 tablet 0  . levofloxacin (LEVAQUIN) 500 MG tablet Take 1 tablet (500 mg total) by mouth daily. 14 tablet 0  . lisinopril (PRINIVIL,ZESTRIL) 10 MG tablet Take 1 tablet (10 mg total) by mouth 2 (two) times daily. Patient takes doses 12 hours apart at 1000am and  2200pm 60 tablet 3  . lisinopril (PRINIVIL,ZESTRIL) 10 MG tablet TAKE 1 TABLET EVERY 12 HOURS 180 tablet 1  . metoprolol tartrate (LOPRESSOR) 25 MG tablet Take 1 tablet (25 mg total) by mouth 2 (two) times daily. Patient takes doses 12 hours apart at 1000am and 2200pm 180 tablet 1  . Multiple Vitamins-Minerals (CENTRUM SILVER ULTRA WOMENS PO) Take 1 tablet by mouth daily.    . nitrofurantoin, macrocrystal-monohydrate, (MACROBID) 100 MG capsule Take 1 capsule (100 mg total) by mouth 2 (two) times daily. 1 po BId 14 capsule 0  . nitroGLYCERIN (NITROSTAT) 0.4 MG SL tablet Place 1 tablet (0.4 mg total) under the tongue every 5 (five) minutes as needed for chest pain. 25 tablet 4  . NORPACE CR 100 MG 12 hr capsule Take 1 capsule (100 mg total) by mouth 2 (two) times daily. 180 capsule 2  . pantoprazole (PROTONIX) 40 MG tablet Take 1 tablet (40 mg total) by mouth 2 (two) times daily. 60 tablet 4  . PARoxetine (PAXIL) 20 MG tablet Take 1 tablet (20 mg total) by mouth every morning. 90 tablet 0  . torsemide (DEMADEX) 20 MG tablet Take 20 mg by mouth daily as needed (edema).     No facility-administered medications prior to visit.     Allergies:   Adhesive; Actos; Contrast media; Latex; and Lipitor   Social History   Social History  . Marital Status: Widowed    Spouse Name: N/A  . Number of Children: N/A  . Years of Education: N/A   Social History Main Topics  . Smoking status: Never Smoker   . Smokeless tobacco: Never Used  . Alcohol Use: No  . Drug Use: No  . Sexual Activity: No   Other Topics Concern  . None   Social History Narrative     Family History:  The patient's family history includes Asthma in her brother; Atrial fibrillation in her sister; Cancer in her daughter; Diabetes in her daughter; Heart attack in her brother and father; Hyperlipidemia in her sister; Macular degeneration in her sister; Osteoporosis in her sister; Other in her father and mother; Stroke in her mother,  sister, and sister; Uterine cancer in her sister. There is no history of Colon polyps.   ROS:   Please see the history of present illness.  ROS All other systems reviewed and are negative.   PHYSICAL EXAM:   VS:  BP 176/90 mmHg  Pulse 56  Ht 5\' 3"  (1.6 m)  Wt 78.654 kg (173 lb 6.4 oz)  BMI 30.72 kg/m2   GEN: Well nourished, well developed, in no acute distress HEENT: normal Neck: no JVD, carotid bruits, or masses Cardiac: RRR; no murmurs, rubs, or gallops,no edema , healthy subclavian ICD site Respiratory:  clear to auscultation bilaterally, normal work of breathing GI: soft, nontender, nondistended, + BS MS: no deformity or atrophy Skin: warm and dry, no rash Neuro:  Alert and Oriented x 3, Strength and sensation are intact Psych: euthymic mood, full affect  Wt Readings from Last 3 Encounters:  05/20/15 78.654 kg (173 lb 6.4 oz)  02/03/15 80.468 kg (177 lb 6.4 oz)  01/19/15 78.472 kg (173 lb)      Studies/Labs Reviewed:   EKG:  EKG is ordered today.  The ekg ordered today demonstrates Sinus bradycardia with first-degree AV block, left axis deviation almost meeting criteria for left anterior fascicular block, voltage criteria for LVH. The QRS measures 160 ms. QTC is 463 ms.  Recent Labs: 06/30/2014: Hemoglobin 12.6 01/19/2015: ALT 19; BUN 17; Creatinine, Ser 1.14*; Platelets 280; Potassium 5.0; Sodium 138   Lipid Panel    Component Value Date/Time   CHOL 159 01/19/2015 1051   CHOL 169 09/04/2014 1146   CHOL 140 07/31/2012 1019   TRIG 148 01/19/2015 1051   TRIG 160* 09/04/2014 1146   TRIG 161* 07/31/2012 1019   HDL 59 01/19/2015 1051   HDL 61 09/04/2014 1146   HDL 43 07/31/2012 1019   HDL 50 09/02/2010 0629   CHOLHDL 2.8 09/04/2014 1146   CHOLHDL 2.8 09/02/2010 0629   VLDL 34 09/02/2010 0629   LDLCALC 76 09/04/2014 1146   LDLCALC 65 09/09/2013 0841   LDLCALC 65 07/31/2012 1019   LDLCALC 58 09/02/2010 0629     ASSESSMENT:    1. Catecholaminergic  polymorphic ventricular tachycardia (HCC)   2. Paroxysmal atrial fibrillation, chads2 Vasc2 score of 5, on eliquis   3. CAD in native artery   4. Orthostatic hypotension   5. Near syncope   6. Chronic sinus bradycardia   7. Diabetes mellitus type 2, diet-controlled ([place])   8. CKD (chronic kidney disease) stage 3, GFR 30-59 ml/min   9. Automatic implantable cardioverter-defibrillator in situ   10. Hyperlipidemia      PLAN:  In order of problems listed above:  1. VT: No events and no defibrillator discharges since device implantation in 1993. She has been taking disopyramide without major complications for many years. Reminded her of the potential for multiple drug interactions including with certain antibiotics. She should always ask her pharmacist to check for potential drug interactions, QT prolongation risk. 2. AFib: This was noted at the time of her initial VT cardiac arrest, but recurred in 2015 during an episode of urosepsis. She is on appropriate anticoagulation and has not had bleeding or embolic complications 3. CAD: This became a problem long after her initial episode of VT. She required percutaneous revascularization well over 10 years ago, without any new problems since. Her most recent cardiac catheterization in 2015 did not show new stenoses. 4. Orthostatic hypotension: This seems to be worse when she takes the immediate release disopyramide. It has been well tolerated recently with the exception of the single episode of near syncope likely related to poor oral intake and heat. We'll try to get her the Norpace  CR, but it's not clear if this is still being manufactured. 5. As above 6. Bradycardia: Is related to treatment with beta blockers, but also to intrinsic conduction system disease. She has had numerous device placement procedures and would like to avoid addition of an atrial lead unless really necessary. 7. DM: Diet-controlled 8. CKD: last GFR around 45 mL/min,  stable 9. ICD: Normal device function. Remote download in 3 months, office visit in 6 months.  10. HLP: Excellent LDL cholesterol on current statin regimen   Medication Adjustments/Labs and Tests Ordered: Current medicines are reviewed at length with the patient today.  Concerns regarding medicines are outlined above.  Medication changes, Labs and Tests ordered today are listed in the Patient Instructions below. Patient Instructions  Dr Sallyanne Kuster recommends that you schedule a follow-up appointment in 6 months. You will receive a reminder letter in the mail two months in advance. If you don't receive a letter, please call our office to schedule the follow-up appointment.  If you need a refill on your cardiac medications before your next appointment, please call your pharmacy.  When you have your blood work for your primary care doctor, please request that they send a copy to [place]. Our fax number is [phone].  **When picking up new prescriptions from the pharmacy, please always ask if there are any interactions with your other medications or QT interval prolongation.    Mikael Spray, MD  05/22/2015 11:14 AM    [facility] [name], [place], [place]  [address] Phone: [phone]; Fax: [phone]

## 2015-05-20 NOTE — Patient Instructions (Signed)
Dr Sallyanne Kuster recommends that you schedule a follow-up appointment in 6 months. You will receive a reminder letter in the mail two months in advance. If you don't receive a letter, please call our office to schedule the follow-up appointment.  If you need a refill on your cardiac medications before your next appointment, please call your pharmacy.  When you have your blood work for your primary care doctor, please request that they send a copy to [place]. Our fax number is [phone].  **When picking up new prescriptions from the pharmacy, please always ask if there are any interactions with your other medications or QT interval prolongation.

## 2015-05-22 DIAGNOSIS — R55 Syncope and collapse: Secondary | ICD-10-CM | POA: Insufficient documentation

## 2015-05-22 HISTORY — DX: Syncope and collapse: R55

## 2015-05-27 ENCOUNTER — Other Ambulatory Visit: Payer: Self-pay | Admitting: Cardiovascular Disease

## 2015-06-05 LAB — CUP PACEART INCLINIC DEVICE CHECK
Battery Remaining Longevity: 144 mo
HighPow Impedance: 48 Ohm
Implantable Lead Location: 753860
Implantable Lead Serial Number: 138237
Lead Channel Impedance Value: 805 Ohm
Lead Channel Pacing Threshold Amplitude: 0.7 V
Lead Channel Pacing Threshold Pulse Width: 0.5 ms
Lead Channel Setting Pacing Amplitude: 2.2 V
MDC IDC LEAD IMPLANT DT: 20090120
MDC IDC LEAD MODEL: 157
MDC IDC MSMT BATTERY REMAINING PERCENTAGE: 100 %
MDC IDC SESS DTM: 20170504130900
MDC IDC SET LEADCHNL RV PACING PULSEWIDTH: 0.5 ms
MDC IDC SET LEADCHNL RV SENSING SENSITIVITY: 0.6 mV
MDC IDC STAT BRADY RV PERCENT PACED: 0 %
Pulse Gen Serial Number: 191956

## 2015-06-07 ENCOUNTER — Other Ambulatory Visit: Payer: Self-pay | Admitting: Cardiovascular Disease

## 2015-06-07 ENCOUNTER — Ambulatory Visit (INDEPENDENT_AMBULATORY_CARE_PROVIDER_SITE_OTHER): Payer: Medicare Other | Admitting: Family Medicine

## 2015-06-07 ENCOUNTER — Encounter: Payer: Self-pay | Admitting: Family Medicine

## 2015-06-07 VITALS — BP 128/83 | HR 56 | Temp 97.8°F | Ht 63.0 in | Wt 171.8 lb

## 2015-06-07 DIAGNOSIS — I10 Essential (primary) hypertension: Secondary | ICD-10-CM | POA: Diagnosis not present

## 2015-06-07 DIAGNOSIS — N183 Chronic kidney disease, stage 3 unspecified: Secondary | ICD-10-CM

## 2015-06-07 DIAGNOSIS — E7800 Pure hypercholesterolemia, unspecified: Secondary | ICD-10-CM

## 2015-06-07 DIAGNOSIS — R300 Dysuria: Secondary | ICD-10-CM

## 2015-06-07 DIAGNOSIS — Z9181 History of falling: Secondary | ICD-10-CM | POA: Diagnosis not present

## 2015-06-07 DIAGNOSIS — I480 Paroxysmal atrial fibrillation: Secondary | ICD-10-CM | POA: Diagnosis not present

## 2015-06-07 DIAGNOSIS — E785 Hyperlipidemia, unspecified: Secondary | ICD-10-CM

## 2015-06-07 DIAGNOSIS — R42 Dizziness and giddiness: Secondary | ICD-10-CM | POA: Diagnosis not present

## 2015-06-07 DIAGNOSIS — E1169 Type 2 diabetes mellitus with other specified complication: Secondary | ICD-10-CM

## 2015-06-07 DIAGNOSIS — E1142 Type 2 diabetes mellitus with diabetic polyneuropathy: Secondary | ICD-10-CM

## 2015-06-07 DIAGNOSIS — R5383 Other fatigue: Secondary | ICD-10-CM

## 2015-06-07 DIAGNOSIS — N390 Urinary tract infection, site not specified: Secondary | ICD-10-CM

## 2015-06-07 DIAGNOSIS — E559 Vitamin D deficiency, unspecified: Secondary | ICD-10-CM | POA: Diagnosis not present

## 2015-06-07 DIAGNOSIS — B9620 Unspecified Escherichia coli [E. coli] as the cause of diseases classified elsewhere: Secondary | ICD-10-CM

## 2015-06-07 DIAGNOSIS — Z780 Asymptomatic menopausal state: Secondary | ICD-10-CM

## 2015-06-07 DIAGNOSIS — I2510 Atherosclerotic heart disease of native coronary artery without angina pectoris: Secondary | ICD-10-CM | POA: Diagnosis not present

## 2015-06-07 DIAGNOSIS — E119 Type 2 diabetes mellitus without complications: Secondary | ICD-10-CM | POA: Diagnosis not present

## 2015-06-07 LAB — URINALYSIS, COMPLETE
BILIRUBIN UA: NEGATIVE
GLUCOSE, UA: NEGATIVE
KETONES UA: NEGATIVE
Nitrite, UA: NEGATIVE
Protein, UA: NEGATIVE
RBC UA: NEGATIVE
SPEC GRAV UA: 1.01 (ref 1.005–1.030)
Urobilinogen, Ur: 0.2 mg/dL (ref 0.2–1.0)
pH, UA: 5.5 (ref 5.0–7.5)

## 2015-06-07 LAB — MICROSCOPIC EXAMINATION: RBC, UA: NONE SEEN /hpf (ref 0–?)

## 2015-06-07 LAB — BAYER DCA HB A1C WAIVED: HB A1C (BAYER DCA - WAIVED): 6.7 % (ref <7.0)

## 2015-06-07 NOTE — Patient Instructions (Addendum)
Continue current medications. Continue good therapeutic lifestyle changes which include good diet and exercise. Fall precautions discussed with patient. If an FOBT was given today- please return it to our front desk. If you are over 80 years old - you may need Prevnar 48 or the adult Pneumonia vaccine.  Flu Shots will be available at our office starting mid- September. Please call and schedule a FLU CLINIC APPOINTMENT.                        Medicare Annual Wellness Visit  [facility] and the medical providers at [facility] strive to bring you the best medical care.  In doing so we not only want to address your current medical conditions and concerns but also to detect new conditions early and prevent illness, disease and health-related problems.    Medicare offers a yearly Wellness Visit which allows our clinical staff to assess your need for preventative services including immunizations, lifestyle education, counseling to decrease risk of preventable diseases and screening for fall risk and other medical concerns.    This visit is provided free of charge (no copay) for all Medicare recipients. The clinical pharmacists at [facility] have begun to conduct these Wellness Visits which will also include a thorough review of all your medications.    As you primary medical provider recommend that you make an appointment for your Annual Wellness Visit if you have not done so already this year.  You may set up this appointment before you leave today or you may call back [phone]) and schedule an appointment.  Please make sure when you call that you mention that you are scheduling your Annual Wellness Visit with the clinical pharmacist so that the appointment may be made for the proper length of time.    Continue to follow-up with cardiology and urology We will call with results of lab work as soon as those results become available and we will send a copy  of your lab work results to your cardiologist. Continue to monitor feet closely and monitor blood sugars closely Don't forget to follow-up with ear nose and throat specialist to see if he can remove the cerumen especially from the right ear canal as this may help the fluttering sensation you have in your ear.

## 2015-06-07 NOTE — Progress Notes (Signed)
Subjective:    Patient ID: [name], female    DOB: 1931-12-30, 82 y.o.   MRN: [id number]  HPI Patient is here today for her 4 month follow up of her diabetes and hyperlipidemia. Patient is also complaining with dizziness, no energy and dysuria.She sees a cardiologist regularly. She is due to have a DEXA scan and this will be scheduled if she cannot get this done today. The patient is doing well overall and she is feeling some better with the dizziness and the reaction she had to taking Levaquin. She is planning to see an ear nose and throat specialist about the wax in her right ear canal and the fluttering sensation that she has from this. She denies any chest pain or palpitations today. She just saw the cardiologist and got a good report from him. She has no nausea vomiting or diarrhea and is followed also regularly by the urologist for chronic urinary tract infections. She recently saw him and has another appointment coming up in about 3 months. She is alert and cooperative today.   Review of Systems  Constitutional: Positive for fatigue.  HENT: Negative.   Eyes: Negative.   Respiratory: Negative.   Cardiovascular: Negative.   Gastrointestinal: Negative.   Endocrine: Negative.   Genitourinary: Positive for dysuria.  Musculoskeletal: Negative.   Skin: Negative.   Allergic/Immunologic: Negative.   Neurological: Positive for dizziness.  Hematological: Negative.   Psychiatric/Behavioral: Negative.        Patient Active Problem List   Diagnosis Date Noted  . Near syncope 05/22/2015  . Diabetes mellitus type 2, diet-controlled ([place]) 09/14/2014  . ICD (implantable cardioverter-defibrillator) battery depletion 01/20/2014  . Abnormal nuclear stress test, 12/30/13 12/31/2013  . CAD in native artery 12/31/2013  . Unstable angina, neg MI, cath stable.maybe GI 12/30/2013  . Paroxysmal atrial fibrillation, chads2 Vasc2 score of 5, on eliquis 10/16/2013  . Catecholaminergic  polymorphic ventricular tachycardia ([place]) 10/16/2013  . Ureteral stone with hydronephrosis 05/20/2013  . Metabolic syndrome 40/98/1191  . Hypotension, meds stopped this admission 07/29/2011  . Orthostatic hypotension 07/27/2011  . Chest pain, r/o cardiac, negative MI 07/25/2011  . Chronic sinus bradycardia 07/25/2011  . Automatic implantable cardioverter-defibrillator in situ 07/25/2011  . CKD (chronic kidney disease) stage 3, GFR 30-59 ml/min 07/25/2011    Class: Chronic  . History of DVT in the past, on eliquis now 07/25/2011    Class: History of  . Nephrolithiasis, just saw Dr [name] 07/25/2011    Class: History of  . DYSPHAGIA 03/24/2010  . Chronic ischemic heart disease 12/03/2007  . RECTAL BLEEDING 12/03/2007  . Diarrhea 12/03/2007  . EPIGASTRIC PAIN 12/03/2007  . Hyperlipidemia 12/02/2007    Class: History of  . EXTERNAL HEMORRHOIDS 12/02/2007    Class: History of  . ESOPHAGITIS 12/02/2007  . GASTRITIS 12/02/2007  . COLITIS 12/02/2007  . DIVERTICULOSIS, COLON 12/02/2007  . ARTHRITIS 12/02/2007    Class: Chronic   Outpatient Encounter Prescriptions as of 06/07/2015  Medication Sig  . acetaminophen (TYLENOL) 325 MG tablet Take 650 mg by mouth every 6 (six) hours as needed for mild pain or moderate pain.   . Calcium Citrate-Vitamin D (CITRACAL + D PO) Take 1 tablet by mouth daily.  . carboxymethylcellulose (REFRESH PLUS) 0.5 % SOLN Place 1 drop into both eyes 3 (three) times daily as needed (dry eyes).   . Cholecalciferol (VITAMIN D) 1000 UNITS capsule Take 3,000 Units by mouth daily.   . CRESTOR 40 MG tablet TAKE 1 TABLET  DAILY  . disopyramide (NORPACE) 100 MG capsule TAKE (2) CAPSULES TWICE DAILY.  Marland Kitchen ELIQUIS 2.5 MG TABS tablet TAKE  (1)  TABLET TWICE A DAY.  Marland Kitchen Ferrous Sulfate (IRON) 325 (65 FE) MG TABS Take 325 tablets by mouth daily.  Marland Kitchen glucose blood (ACCU-CHEK AVIVA PLUS) test strip Use to check BG once daily.  DX: E11.9  . Lancet Devices (ACCU-CHEK SOFTCLIX)  lancets Use to check BG once daily.  Dx:  E11.9  . lisinopril (PRINIVIL,ZESTRIL) 10 MG tablet Take 1 tablet (10 mg total) by mouth 2 (two) times daily. Patient takes doses 12 hours apart at 1000am and 2200pm  . metoprolol tartrate (LOPRESSOR) 25 MG tablet Take 1 tablet (25 mg total) by mouth 2 (two) times daily. Patient takes doses 12 hours apart at 1000am and 2200pm  . Multiple Vitamins-Minerals (CENTRUM SILVER ULTRA WOMENS PO) Take 1 tablet by mouth daily.  . nitroGLYCERIN (NITROSTAT) 0.4 MG SL tablet Place 1 tablet (0.4 mg total) under the tongue every 5 (five) minutes as needed for chest pain.  . NORPACE CR 100 MG 12 hr capsule Take 1 capsule (100 mg total) by mouth 2 (two) times daily.  . pantoprazole (PROTONIX) 40 MG tablet Take 1 tablet (40 mg total) by mouth 2 (two) times daily.  Marland Kitchen PARoxetine (PAXIL) 20 MG tablet Take 1 tablet (20 mg total) by mouth every morning.  . torsemide (DEMADEX) 20 MG tablet Take 20 mg by mouth daily as needed (edema).  . [DISCONTINUED] levofloxacin (LEVAQUIN) 250 MG tablet Take 1 tablet (250 mg total) by mouth daily.  . [DISCONTINUED] levofloxacin (LEVAQUIN) 500 MG tablet Take 1 tablet (500 mg total) by mouth daily.  . [DISCONTINUED] lisinopril (PRINIVIL,ZESTRIL) 10 MG tablet TAKE 1 TABLET EVERY 12 HOURS  . [DISCONTINUED] nitrofurantoin, macrocrystal-monohydrate, (MACROBID) 100 MG capsule Take 1 capsule (100 mg total) by mouth 2 (two) times daily. 1 po BId   No facility-administered encounter medications on file as of 06/07/2015.       Objective:   Physical Exam  Constitutional: She is oriented to person, place, and time. She appears well-developed and well-nourished. No distress.  The heart is 60/m with a regular rate and rhythm  HENT:  Head: Normocephalic and atraumatic.  Right Ear: External ear normal.  Left Ear: External ear normal.  Nose: Nose normal.  Mouth/Throat: Oropharynx is clear and moist. No oropharyngeal exudate.  Eyes: Conjunctivae and EOM  are normal. Pupils are equal, round, and reactive to light. Right eye exhibits no discharge. Left eye exhibits no discharge. No scleral icterus.  Neck: Normal range of motion. Neck supple. No thyromegaly present.  No bruits or thyromegaly  Cardiovascular: Normal rate, regular rhythm, normal heart sounds and intact distal pulses.   No murmur heard. The patient does have a lot of varicosities in both lower legs and feet.  Pulmonary/Chest: Effort normal and breath sounds normal. No respiratory distress. She has no wheezes. She has no rales. She exhibits no tenderness.  Clear anteriorly and posteriorly  Abdominal: Soft. Bowel sounds are normal. She exhibits no mass. There is tenderness. There is no rebound and no guarding.  There was generalized abdominal tenderness and scar tissue present in the left upper quadrant from previous pacemaker implants.  Musculoskeletal: Normal range of motion. She exhibits no edema or tenderness.  Lymphadenopathy:    She has no cervical adenopathy.  Neurological: She is alert and oriented to person, place, and time. She has normal reflexes. No cranial nerve deficit.  Decreased sensation in both  feet  Skin: Skin is warm and dry. No rash noted.  Psychiatric: She has a normal mood and affect. Her behavior is normal. Judgment and thought content normal.  Nursing note and vitals reviewed.   BP 128/83 mmHg  Pulse 56  Temp(Src) 97.8 F (36.6 C) (Oral)  Ht 5' 3" (1.6 m)  Wt 171 lb 12.8 oz (77.928 kg)  BMI 30.44 kg/m2       Assessment & Plan:  1. Vitamin D deficiency -Continue with current treatment pending results of lab work and BMP - VITAMIN D 25 Hydroxy (Vit-D Deficiency, Fractures)  2. Diabetes mellitus type 2, diet-controlled ([place]) -Continue with aggressive therapeutic lifestyle changes which include diet and exercise  3. Hyperlipidemia -Continue with Crestor pending results of lab work - NMR, lipoprofile - Hepatic function panel  4. Essential  hypertension -Continue with current medication and follow-up with cardiologist as planned - BMP8+EGFR - CBC with Differential/Platelet  5. Type 2 diabetes, diet controlled ([place]) -Continue with aggressive therapeutic lifestyle changes - Bayer DCA Hb A1c Waived  6. Post-menopausal -Return to clinic for DEXA scan - DG [facility] DEXA; Future  7. Risk for falls -Continue to be careful do not put yourself at risk for falling - DG [facility] DEXA; Future  8. Dysuria -Continue follow-up with urology - Urinalysis, Complete - Urine culture  9. Paroxysmal atrial fibrillation, chads2 Vasc2 score of 5, on eliquis -Continue follow-up with cardiology  10. CKD (chronic kidney disease) stage 3, GFR 30-59 ml/min -Avoid Levaquin in the future  11. Other fatigue - Thyroid Panel With TSH  12. Dizziness and giddiness -The patient is just seen the cardiologist and he was not aware of anything other than the recent Levaquin in combination with Norpace which could potentially have caused some of her dizziness which is getting better now.  13. Type 2 diabetes mellitus with hyperlipidemia ([place]) -Continue monitoring blood sugars at home check feet regularly  14. Type 2 diabetes mellitus with peripheral neuropathy ([facility]) -Check feet regularly and keep blood sugar under good control with diet and exercise  Patient Instructions  Continue current medications. Continue good therapeutic lifestyle changes which include good diet and exercise. Fall precautions discussed with patient. If an FOBT was given today- please return it to our front desk. If you are over 63 years old - you may need Prevnar 53 or the adult Pneumonia vaccine.  Flu Shots will be available at our office starting mid- September. Please call and schedule a FLU CLINIC APPOINTMENT.                        Medicare Annual Wellness Visit  [facility] and the medical providers at [facility] strive to bring you the best  medical care.  In doing so we not only want to address your current medical conditions and concerns but also to detect new conditions early and prevent illness, disease and health-related problems.    Medicare offers a yearly Wellness Visit which allows our clinical staff to assess your need for preventative services including immunizations, lifestyle education, counseling to decrease risk of preventable diseases and screening for fall risk and other medical concerns.    This visit is provided free of charge (no copay) for all Medicare recipients. The clinical pharmacists at [facility] have begun to conduct these Wellness Visits which will also include a thorough review of all your medications.    As you primary medical provider recommend that you make  an appointment for your Annual Wellness Visit if you have not done so already this year.  You may set up this appointment before you leave today or you may call back ([phone]) and schedule an appointment.  Please make sure when you call that you mention that you are scheduling your Annual Wellness Visit with the clinical pharmacist so that the appointment may be made for the proper length of time.    Continue to follow-up with cardiology and urology We will call with results of lab work as soon as those results become available and we will send a copy of your lab work results to your cardiologist. Continue to monitor feet closely and monitor blood sugars closely Don't forget to follow-up with ear nose and throat specialist to see if he can remove the cerumen especially from the right ear canal as this may help the fluttering sensation you have in your ear.   Arrie Senate MD

## 2015-06-08 LAB — CBC WITH DIFFERENTIAL/PLATELET
BASOS: 1 %
Basophils Absolute: 0 10*3/uL (ref 0.0–0.2)
EOS (ABSOLUTE): 0.2 10*3/uL (ref 0.0–0.4)
Eos: 4 %
Hematocrit: 39.6 % (ref 34.0–46.6)
Hemoglobin: 13.2 g/dL (ref 11.1–15.9)
IMMATURE GRANULOCYTES: 0 %
Immature Grans (Abs): 0 10*3/uL (ref 0.0–0.1)
Lymphocytes Absolute: 1.6 10*3/uL (ref 0.7–3.1)
Lymphs: 28 %
MCH: 31.3 pg (ref 26.6–33.0)
MCHC: 33.3 g/dL (ref 31.5–35.7)
MCV: 94 fL (ref 79–97)
MONOS ABS: 0.5 10*3/uL (ref 0.1–0.9)
Monocytes: 9 %
NEUTROS PCT: 58 %
Neutrophils Absolute: 3.4 10*3/uL (ref 1.4–7.0)
PLATELETS: 256 10*3/uL (ref 150–379)
RBC: 4.22 x10E6/uL (ref 3.77–5.28)
RDW: 14 % (ref 12.3–15.4)
WBC: 5.9 10*3/uL (ref 3.4–10.8)

## 2015-06-08 LAB — HEPATIC FUNCTION PANEL
ALBUMIN: 4.2 g/dL (ref 3.5–4.7)
ALK PHOS: 68 IU/L (ref 39–117)
ALT: 30 IU/L (ref 0–32)
AST: 26 IU/L (ref 0–40)
Bilirubin Total: 0.7 mg/dL (ref 0.0–1.2)
Bilirubin, Direct: 0.18 mg/dL (ref 0.00–0.40)
Total Protein: 6.7 g/dL (ref 6.0–8.5)

## 2015-06-08 LAB — NMR, LIPOPROFILE
Cholesterol: 172 mg/dL (ref 100–199)
HDL Cholesterol by NMR: 68 mg/dL (ref >39)
HDL PARTICLE NUMBER: 40.7 umol/L (ref >=30.5)
LDL Particle Number: 1159 nmol/L — ABNORMAL HIGH (ref <1000)
LDL SIZE: 21 nm (ref >20.5)
LDL-C: 81 mg/dL (ref 0–99)
LP-IR Score: 49 — ABNORMAL HIGH (ref <=45)
SMALL LDL PARTICLE NUMBER: 471 nmol/L (ref <=527)
Triglycerides by NMR: 115 mg/dL (ref 0–149)

## 2015-06-08 LAB — THYROID PANEL WITH TSH
Free Thyroxine Index: 2.1 (ref 1.2–4.9)
T3 UPTAKE RATIO: 29 % (ref 24–39)
T4 TOTAL: 7.1 ug/dL (ref 4.5–12.0)
TSH: 3.61 u[IU]/mL (ref 0.450–4.500)

## 2015-06-08 LAB — BMP8+EGFR
BUN / CREAT RATIO: 12 (ref 12–28)
BUN: 15 mg/dL (ref 8–27)
CHLORIDE: 100 mmol/L (ref 96–106)
CO2: 19 mmol/L (ref 18–29)
Calcium: 9.2 mg/dL (ref 8.7–10.3)
Creatinine, Ser: 1.3 mg/dL — ABNORMAL HIGH (ref 0.57–1.00)
GFR calc non Af Amer: 39 mL/min/{1.73_m2} — ABNORMAL LOW (ref >59)
GFR, EST AFRICAN AMERICAN: 44 mL/min/{1.73_m2} — AB (ref >59)
Glucose: 118 mg/dL — ABNORMAL HIGH (ref 65–99)
POTASSIUM: 4.7 mmol/L (ref 3.5–5.2)
SODIUM: 139 mmol/L (ref 134–144)

## 2015-06-08 LAB — VITAMIN D 25 HYDROXY (VIT D DEFICIENCY, FRACTURES): VIT D 25 HYDROXY: 27.9 ng/mL — AB (ref 30.0–100.0)

## 2015-06-08 NOTE — Telephone Encounter (Signed)
Rx(s) sent to pharmacy electronically.  

## 2015-06-09 ENCOUNTER — Encounter: Payer: Self-pay | Admitting: Cardiovascular Disease

## 2015-06-09 LAB — URINE CULTURE

## 2015-06-10 MED ORDER — AMOXICILLIN-POT CLAVULANATE 875-125 MG PO TABS: 1.0000 | ORAL_TABLET | Freq: Two times a day (BID) | ORAL | Status: DC

## 2015-06-10 NOTE — Addendum Note (Signed)
Addended by: Thana Ates on: 06/10/2015 10:54 AM   Modules accepted: Orders

## 2015-06-17 ENCOUNTER — Telehealth: Payer: Self-pay | Admitting: *Deleted

## 2015-06-17 DIAGNOSIS — H353221 Exudative age-related macular degeneration, left eye, with active choroidal neovascularization: Secondary | ICD-10-CM | POA: Diagnosis not present

## 2015-06-17 MED ORDER — FLUCONAZOLE 150 MG PO TABS: 150.0000 mg | ORAL_TABLET | Freq: Once | ORAL | Status: DC

## 2015-06-17 NOTE — Telephone Encounter (Signed)
Pt needs something for yeast infection - from antibiotic. -   Diflucan called in per [facility]

## 2015-06-18 ENCOUNTER — Telehealth: Payer: Self-pay | Admitting: Family Medicine

## 2015-06-22 NOTE — Telephone Encounter (Signed)
Pt to call GI - Dr Henrene Pastor regarding what tests they may recommend for her She will call and give them her symptoms tomorrow.  She had a virtual CT Colon 06/2014 and a CT abdomen 03/2015.  A cologaurd was mentioned  - but we would rather she discuss this with GI

## 2015-06-29 ENCOUNTER — Ambulatory Visit: Payer: Medicare Other | Admitting: Pharmacist

## 2015-07-01 ENCOUNTER — Other Ambulatory Visit: Payer: Medicare Other

## 2015-07-01 DIAGNOSIS — B9620 Unspecified Escherichia coli [E. coli] as the cause of diseases classified elsewhere: Secondary | ICD-10-CM

## 2015-07-01 DIAGNOSIS — N390 Urinary tract infection, site not specified: Principal | ICD-10-CM

## 2015-07-02 LAB — URINE CULTURE

## 2015-07-05 ENCOUNTER — Ambulatory Visit (INDEPENDENT_AMBULATORY_CARE_PROVIDER_SITE_OTHER): Payer: Medicare Other | Admitting: Otolaryngology

## 2015-07-05 DIAGNOSIS — H903 Sensorineural hearing loss, bilateral: Secondary | ICD-10-CM | POA: Diagnosis not present

## 2015-07-05 DIAGNOSIS — H6123 Impacted cerumen, bilateral: Secondary | ICD-10-CM | POA: Diagnosis not present

## 2015-07-06 ENCOUNTER — Telehealth: Payer: Self-pay | Admitting: Family Medicine

## 2015-07-06 NOTE — Telephone Encounter (Signed)
Patient aware of results.

## 2015-07-21 ENCOUNTER — Other Ambulatory Visit: Payer: Self-pay | Admitting: Family Medicine

## 2015-08-13 ENCOUNTER — Ambulatory Visit (INDEPENDENT_AMBULATORY_CARE_PROVIDER_SITE_OTHER): Payer: Medicare Other | Admitting: Urology

## 2015-08-13 DIAGNOSIS — R300 Dysuria: Secondary | ICD-10-CM

## 2015-08-13 DIAGNOSIS — N952 Postmenopausal atrophic vaginitis: Secondary | ICD-10-CM

## 2015-08-13 DIAGNOSIS — Z87442 Personal history of urinary calculi: Secondary | ICD-10-CM

## 2015-08-23 ENCOUNTER — Ambulatory Visit (INDEPENDENT_AMBULATORY_CARE_PROVIDER_SITE_OTHER): Payer: Medicare Other | Admitting: *Deleted

## 2015-08-23 DIAGNOSIS — I472 Ventricular tachycardia: Secondary | ICD-10-CM

## 2015-08-23 DIAGNOSIS — I4729 Other ventricular tachycardia: Secondary | ICD-10-CM

## 2015-08-23 NOTE — Progress Notes (Signed)
Remote ICD transmission.   

## 2015-08-25 ENCOUNTER — Other Ambulatory Visit: Payer: Self-pay | Admitting: Cardiovascular Disease

## 2015-08-25 ENCOUNTER — Encounter: Payer: Self-pay | Admitting: Cardiology

## 2015-08-25 ENCOUNTER — Other Ambulatory Visit: Payer: Self-pay | Admitting: Family Medicine

## 2015-08-31 LAB — CUP PACEART REMOTE DEVICE CHECK
Battery Remaining Longevity: 144 mo
Battery Remaining Percentage: 100 %
Brady Statistic RV Percent Paced: 1 %
Date Time Interrogation Session: 20170807044100
HighPow Impedance: 46 Ohm
Implantable Lead Implant Date: 20090120
Implantable Lead Location: 753860
Implantable Lead Model: 157
Implantable Lead Serial Number: 138237
Lead Channel Impedance Value: 815 Ohm
Lead Channel Setting Pacing Pulse Width: 0.5 ms
MDC IDC SET LEADCHNL RV PACING AMPLITUDE: 2.2 V
MDC IDC SET LEADCHNL RV SENSING SENSITIVITY: 0.6 mV
Pulse Gen Serial Number: 191956

## 2015-09-10 ENCOUNTER — Encounter: Payer: Self-pay | Admitting: Cardiology

## 2015-09-23 ENCOUNTER — Other Ambulatory Visit: Payer: Self-pay | Admitting: Family Medicine

## 2015-10-12 ENCOUNTER — Other Ambulatory Visit: Payer: Medicare Other

## 2015-10-12 DIAGNOSIS — E559 Vitamin D deficiency, unspecified: Secondary | ICD-10-CM

## 2015-10-12 DIAGNOSIS — I10 Essential (primary) hypertension: Secondary | ICD-10-CM

## 2015-10-12 DIAGNOSIS — E119 Type 2 diabetes mellitus without complications: Secondary | ICD-10-CM | POA: Diagnosis not present

## 2015-10-12 DIAGNOSIS — E7800 Pure hypercholesterolemia, unspecified: Secondary | ICD-10-CM

## 2015-10-12 LAB — BAYER DCA HB A1C WAIVED: HB A1C: 6.9 % (ref <7.0)

## 2015-10-13 LAB — CBC WITH DIFFERENTIAL/PLATELET
BASOS ABS: 0.1 10*3/uL (ref 0.0–0.2)
Basos: 1 %
EOS (ABSOLUTE): 0.5 10*3/uL — AB (ref 0.0–0.4)
Eos: 9 %
HEMOGLOBIN: 12.3 g/dL (ref 11.1–15.9)
Hematocrit: 36.3 % (ref 34.0–46.6)
Immature Grans (Abs): 0 10*3/uL (ref 0.0–0.1)
Immature Granulocytes: 0 %
LYMPHS ABS: 1.8 10*3/uL (ref 0.7–3.1)
Lymphs: 34 %
MCH: 31.7 pg (ref 26.6–33.0)
MCHC: 33.9 g/dL (ref 31.5–35.7)
MCV: 94 fL (ref 79–97)
MONOCYTES: 10 %
Monocytes Absolute: 0.5 10*3/uL (ref 0.1–0.9)
NEUTROS ABS: 2.4 10*3/uL (ref 1.4–7.0)
Neutrophils: 46 %
PLATELETS: 286 10*3/uL (ref 150–379)
RBC: 3.88 x10E6/uL (ref 3.77–5.28)
RDW: 13.4 % (ref 12.3–15.4)
WBC: 5.2 10*3/uL (ref 3.4–10.8)

## 2015-10-13 LAB — HEPATIC FUNCTION PANEL
ALBUMIN: 4 g/dL (ref 3.5–4.7)
ALT: 18 IU/L (ref 0–32)
AST: 21 IU/L (ref 0–40)
Alkaline Phosphatase: 74 IU/L (ref 39–117)
Bilirubin Total: 0.6 mg/dL (ref 0.0–1.2)
Bilirubin, Direct: 0.13 mg/dL (ref 0.00–0.40)
TOTAL PROTEIN: 6.7 g/dL (ref 6.0–8.5)

## 2015-10-13 LAB — LIPID PANEL
CHOL/HDL RATIO: 2.8 ratio (ref 0.0–4.4)
CHOLESTEROL TOTAL: 146 mg/dL (ref 100–199)
HDL: 53 mg/dL (ref >39)
LDL CALC: 64 mg/dL (ref 0–99)
TRIGLYCERIDES: 147 mg/dL (ref 0–149)
VLDL CHOLESTEROL CAL: 29 mg/dL (ref 5–40)

## 2015-10-13 LAB — BMP8+EGFR
BUN / CREAT RATIO: 11 — AB (ref 12–28)
BUN: 14 mg/dL (ref 8–27)
CHLORIDE: 103 mmol/L (ref 96–106)
CO2: 22 mmol/L (ref 18–29)
Calcium: 9.3 mg/dL (ref 8.7–10.3)
Creatinine, Ser: 1.26 mg/dL — ABNORMAL HIGH (ref 0.57–1.00)
GFR calc Af Amer: 46 mL/min/{1.73_m2} — ABNORMAL LOW (ref >59)
GFR calc non Af Amer: 40 mL/min/{1.73_m2} — ABNORMAL LOW (ref >59)
GLUCOSE: 109 mg/dL — AB (ref 65–99)
POTASSIUM: 4.3 mmol/L (ref 3.5–5.2)
Sodium: 141 mmol/L (ref 134–144)

## 2015-10-13 LAB — VITAMIN D 25 HYDROXY (VIT D DEFICIENCY, FRACTURES): Vit D, 25-Hydroxy: 28.1 ng/mL — ABNORMAL LOW (ref 30.0–100.0)

## 2015-10-14 ENCOUNTER — Telehealth: Payer: Self-pay | Admitting: Family Medicine

## 2015-10-21 ENCOUNTER — Ambulatory Visit (INDEPENDENT_AMBULATORY_CARE_PROVIDER_SITE_OTHER): Payer: Medicare Other

## 2015-10-21 ENCOUNTER — Encounter: Payer: Self-pay | Admitting: Family Medicine

## 2015-10-21 ENCOUNTER — Ambulatory Visit (INDEPENDENT_AMBULATORY_CARE_PROVIDER_SITE_OTHER): Payer: Medicare Other | Admitting: Family Medicine

## 2015-10-21 VITALS — BP 175/74 | HR 54 | Temp 97.4°F | Ht 63.0 in | Wt 171.0 lb

## 2015-10-21 DIAGNOSIS — E7800 Pure hypercholesterolemia, unspecified: Secondary | ICD-10-CM | POA: Diagnosis not present

## 2015-10-21 DIAGNOSIS — M25512 Pain in left shoulder: Secondary | ICD-10-CM | POA: Diagnosis not present

## 2015-10-21 DIAGNOSIS — E119 Type 2 diabetes mellitus without complications: Secondary | ICD-10-CM

## 2015-10-21 DIAGNOSIS — I10 Essential (primary) hypertension: Secondary | ICD-10-CM

## 2015-10-21 DIAGNOSIS — E0821 Diabetes mellitus due to underlying condition with diabetic nephropathy: Secondary | ICD-10-CM | POA: Diagnosis not present

## 2015-10-21 DIAGNOSIS — N390 Urinary tract infection, site not specified: Secondary | ICD-10-CM

## 2015-10-21 DIAGNOSIS — I2510 Atherosclerotic heart disease of native coronary artery without angina pectoris: Secondary | ICD-10-CM | POA: Diagnosis not present

## 2015-10-21 DIAGNOSIS — I480 Paroxysmal atrial fibrillation: Secondary | ICD-10-CM

## 2015-10-21 DIAGNOSIS — R5383 Other fatigue: Secondary | ICD-10-CM | POA: Diagnosis not present

## 2015-10-21 DIAGNOSIS — E559 Vitamin D deficiency, unspecified: Secondary | ICD-10-CM | POA: Diagnosis not present

## 2015-10-21 DIAGNOSIS — N183 Chronic kidney disease, stage 3 unspecified: Secondary | ICD-10-CM

## 2015-10-21 LAB — URINALYSIS, COMPLETE
Bilirubin, UA: NEGATIVE
Glucose, UA: NEGATIVE
KETONES UA: NEGATIVE
NITRITE UA: NEGATIVE
PH UA: 5.5 (ref 5.0–7.5)
Protein, UA: NEGATIVE
Specific Gravity, UA: 1.02 (ref 1.005–1.030)
Urobilinogen, Ur: 0.2 mg/dL (ref 0.2–1.0)

## 2015-10-21 LAB — MICROSCOPIC EXAMINATION

## 2015-10-21 NOTE — Progress Notes (Signed)
Subjective:    Patient ID: [name], female    DOB: [birth year], 82 y.o.   MRN: [id number]  HPI Pt here for follow up and management of chronic medical problems which includes diabetes, hypertension and a fib. She is taking medications regularly.Patient is followed by the cardiologist regularly. She complains today of weakness and fatigue and left shoulder blade pain. She's had lab work and we will review this with her during the visit today. She is also due to get a chest x-ray. The patient just doesn't feel well. She does say that she has had some left shoulder pain that seems to come through to her left chest and sometimes associated with this is some nausea. This just comes and goes at times. This seems to be associated to her back pain. This morning it was especially severe but is better currently. She does plan to see the cardiologist next week. We do plan to get a chest x-ray today. She doesn't not confirm any injury or lifting experiences other than what she normally does that could've aggravated her left shoulder. She does have some back pain with bending. The legs do not hurt as much if she is off of her feet and resting. They hurt more with walking. Patient denies any more shortness of breath than usual. She does have a history of irritable bowel syndrome and has recurring bouts of loose bowel movements. The stools have been very dark and even to the point of being black at times. Her hemoglobin on her blood work was stable. We will give her an FOBT to return. She also has ongoing issues with passing her water and has had a history of chronic urinary tract infections and would like a urine specimen checked today.     Patient Active Problem List   Diagnosis Date Noted  . Near syncope 05/22/2015  . Diabetes mellitus type 2, diet-controlled ([place]) 09/14/2014  . ICD (implantable cardioverter-defibrillator) battery depletion 01/20/2014  . Abnormal nuclear stress test, 12/30/13 12/31/2013   . CAD in native artery 12/31/2013  . Unstable angina, neg MI, cath stable.maybe GI 12/30/2013  . Paroxysmal atrial fibrillation, chads2 Vasc2 score of 5, on eliquis 10/16/2013  . Catecholaminergic polymorphic ventricular tachycardia ([place]) 10/16/2013  . Ureteral stone with hydronephrosis 05/20/2013  . Metabolic syndrome 43/15/4008  . Hypotension, meds stopped this admission 07/29/2011  . Orthostatic hypotension 07/27/2011  . Chest pain, r/o cardiac, negative MI 07/25/2011  . Chronic sinus bradycardia 07/25/2011  . Automatic implantable cardioverter-defibrillator in situ 07/25/2011  . CKD (chronic kidney disease) stage 3, GFR 30-59 ml/min 07/25/2011    Class: Chronic  . History of DVT in the past, on eliquis now 07/25/2011    Class: History of  . Nephrolithiasis, just saw Dr [name] 07/25/2011    Class: History of  . DYSPHAGIA 03/24/2010  . Chronic ischemic heart disease 12/03/2007  . RECTAL BLEEDING 12/03/2007  . Diarrhea 12/03/2007  . EPIGASTRIC PAIN 12/03/2007  . Hyperlipidemia 12/02/2007    Class: History of  . EXTERNAL HEMORRHOIDS 12/02/2007    Class: History of  . ESOPHAGITIS 12/02/2007  . GASTRITIS 12/02/2007  . COLITIS 12/02/2007  . DIVERTICULOSIS, COLON 12/02/2007  . ARTHRITIS 12/02/2007    Class: Chronic   Outpatient Encounter Prescriptions as of 10/21/2015  Medication Sig  . acetaminophen (TYLENOL) 325 MG tablet Take 650 mg by mouth every 6 (six) hours as needed for mild pain or moderate pain.   . Calcium Citrate-Vitamin D (CITRACAL + D PO) Take  1 tablet by mouth daily.  . carboxymethylcellulose (REFRESH PLUS) 0.5 % SOLN Place 1 drop into both eyes 3 (three) times daily as needed (dry eyes).   . Cholecalciferol (VITAMIN D) 1000 UNITS capsule Take 3,000 Units by mouth daily.   . disopyramide (NORPACE) 100 MG capsule TAKE (2) CAPSULES TWICE DAILY.  Marland Kitchen ELIQUIS 2.5 MG TABS tablet TAKE  (1)  TABLET TWICE A DAY.  Marland Kitchen Ferrous Sulfate (IRON) 325 (65 FE) MG TABS Take 325  tablets by mouth daily.  Marland Kitchen glucose blood (ACCU-CHEK AVIVA PLUS) test strip Use to check BG once daily.  DX: E11.9  . Lancet Devices (ACCU-CHEK SOFTCLIX) lancets Use to check BG once daily.  Dx:  E11.9  . lisinopril (PRINIVIL,ZESTRIL) 10 MG tablet TAKE 1 TABLET EVERY 12 HOURS  . metoprolol tartrate (LOPRESSOR) 25 MG tablet TAKE (1) TABLET TWICE A DAY AT 10 A.M. & 10 P.M.  . Multiple Vitamins-Minerals (CENTRUM SILVER ULTRA WOMENS PO) Take 1 tablet by mouth daily.  . nitroGLYCERIN (NITROSTAT) 0.4 MG SL tablet Place 1 tablet (0.4 mg total) under the tongue every 5 (five) minutes as needed for chest pain.  . NORPACE CR 100 MG 12 hr capsule Take 1 capsule (100 mg total) by mouth 2 (two) times daily.  . pantoprazole (PROTONIX) 40 MG tablet Take 1 tablet (40 mg total) by mouth 2 (two) times daily.  Marland Kitchen PARoxetine (PAXIL) 20 MG tablet Take 1 tablet (20 mg total) by mouth every morning.  . rosuvastatin (CRESTOR) 40 MG tablet TAKE 1 TABLET DAILY  . torsemide (DEMADEX) 20 MG tablet Take 20 mg by mouth daily as needed (edema).  . [DISCONTINUED] amoxicillin-clavulanate (AUGMENTIN) 875-125 MG tablet Take 1 tablet by mouth 2 (two) times daily.  . [DISCONTINUED] fluconazole (DIFLUCAN) 150 MG tablet Take 1 tablet (150 mg total) by mouth once. Repeat in one week   No facility-administered encounter medications on file as of 10/21/2015.      Review of Systems  Constitutional: Positive for fatigue.  HENT: Negative.   Eyes: Negative.   Respiratory: Negative.   Cardiovascular: Negative.   Gastrointestinal: Negative.   Endocrine: Negative.   Genitourinary: Negative.   Musculoskeletal: Positive for arthralgias (left shoulder blade pain ).  Skin: Negative.   Allergic/Immunologic: Negative.   Neurological: Positive for weakness.  Hematological: Negative.   Psychiatric/Behavioral: Negative.        Objective:   Physical Exam  Constitutional: She is oriented to person, place, and time. She appears  well-developed and well-nourished. No distress.  HENT:  Head: Normocephalic and atraumatic.  Right Ear: External ear normal.  Left Ear: External ear normal.  Nose: Nose normal.  Mouth/Throat: Oropharynx is clear and moist.  Eyes: Conjunctivae and EOM are normal. Pupils are equal, round, and reactive to light. Right eye exhibits no discharge. Left eye exhibits no discharge. No scleral icterus.  Neck: Normal range of motion. Neck supple. No JVD present. No thyromegaly present.  No bruits or thyromegaly  Cardiovascular: Normal rate, regular rhythm, normal heart sounds and intact distal pulses.   No murmur heard. The heart is 60/m with a regular rate and rhythm  Pulmonary/Chest: Effort normal and breath sounds normal. No respiratory distress. She has no wheezes. She has no rales. She exhibits tenderness.  Clear anteriorly and posteriorly  Abdominal: Soft. Bowel sounds are normal. She exhibits no mass. There is tenderness. There is no rebound and no guarding.  Slight tenderness in the left lower quadrant  Musculoskeletal: Normal range of motion. She exhibits tenderness.  She exhibits no edema.  There was tenderness in the left medial subscapular area. This reproduced the pain that she's been having in her back  Lymphadenopathy:    She has no cervical adenopathy.  Neurological: She is alert and oriented to person, place, and time. She has normal reflexes. No cranial nerve deficit.  Skin: Skin is warm and dry. No rash noted.  Psychiatric: She has a normal mood and affect. Her behavior is normal. Judgment and thought content normal.  The patient  is experiencing malaise.  Nursing note and vitals reviewed.  BP (!) 175/74 (BP Location: Left Arm)   Pulse (!) 54   Temp 97.4 F (36.3 C) (Oral)   Ht 5\' 3"  (1.6 m)   Wt 171 lb (77.6 kg)   BMI 30.29 kg/m   [facility] reading (PRIMARY) by  Dr.Kjerstin Abrigo--a x-ray with results pending                                  Previous thoracic spine films and CT scan  of the lumbar spine were reviewed and revealed a lot of wear and tear arthritis with especially some nerve impingement at L2 and 3. In the low back.      Assessment & Plan:  1. Diabetes mellitus type 2, diet-controlled ([facility]) -The hemoglobin A1c was good and under 7 and at goal and she will continue with current treatment  2. Vitamin D deficiency -Continue current treatment  3. Hyperlipidemia -Cholesterol numbers were good and the patient will continue with aggressive therapeutic lifestyle changes   4. Essential hypertension -The blood pressure was elevated today and she will continue with current treatment as she is seeing the cardiologist next week. - DG Chest 2 View; Future  5. Paroxysmal atrial fibrillation, chads2 Vasc2 score of 5, on eliquis -Continue to follow-up with cardiology. The heart had a regular rate and rhythm today at 60/m - DG Chest 2 View; Future  6. CKD (chronic kidney disease) stage 3, GFR 30-59 ml/min -Renal function is stable.  7. Acute pain of left shoulder -This appears to be a left subscapular bursitis and she will discuss this with the orthopedist at his visit next week. - DG Chest 2 View; Future  8. Diabetes mellitus due to underlying condition with diabetic nephropathy, without long-term current use of insulin (HCC) -Continue with current treatment and aggressive therapeutic lifestyle changes  9. Frequent UTI - Urinalysis, Complete  10. Fatigue, unspecified type - Urinalysis, Complete - Thyroid Panel With TSH  Patient Instructions                       Medicare Annual Wellness Visit  [facility] and the medical providers at [facility] strive to bring you the best medical care.  In doing so we not only want to address your current medical conditions and concerns but also to detect new conditions early and prevent illness, disease and health-related problems.    Medicare offers a yearly Wellness Visit which allows our  clinical staff to assess your need for preventative services including immunizations, lifestyle education, counseling to decrease risk of preventable diseases and screening for fall risk and other medical concerns.    This visit is provided free of charge (no copay) for all Medicare recipients. The clinical pharmacists at [facility] have begun to conduct these Wellness Visits which will also include a thorough review of all your  medications.    As you primary medical provider recommend that you make an appointment for your Annual Wellness Visit if you have not done so already this year.  You may set up this appointment before you leave today or you may call back ([phone]) and schedule an appointment.  Please make sure when you call that you mention that you are scheduling your Annual Wellness Visit with the clinical pharmacist so that the appointment may be made for the proper length of time.     Continue current medications. Continue good therapeutic lifestyle changes which include good diet and exercise. Fall precautions discussed with patient. If an FOBT was given today- please return it to our front desk. If you are over 46 years old - you may need Prevnar 59 or the adult Pneumonia vaccine.  **Flu shots are available--- please call and schedule a FLU-CLINIC appointment**  After your visit with [place] today you will receive a survey in the mail or online from [employer] regarding your care with [place]. Please take a moment to fill this out. Your feedback is very important to [place] as you can help [place] better understand your patient needs as well as improve your experience and satisfaction. WE CARE ABOUT YOU!!!   Keep follow-up appointment with orthopedist and discuss back pain and leg pain Also discussed the left subscapular bursitis In the meantime use warm wet compresses to this area and take Tylenol Keep follow-up appointment with cardiologist Return to clinic in a couple of  weeks and we will freeze the wart on her index finger and give you your flu shot This winter drink plenty of fluids and stay well hydrated    Arrie Senate MD

## 2015-10-21 NOTE — Patient Instructions (Addendum)
Medicare Annual Wellness Visit  [facility] and the medical providers at [facility] strive to bring you the best medical care.  In doing so we not only want to address your current medical conditions and concerns but also to detect new conditions early and prevent illness, disease and health-related problems.    Medicare offers a yearly Wellness Visit which allows our clinical staff to assess your need for preventative services including immunizations, lifestyle education, counseling to decrease risk of preventable diseases and screening for fall risk and other medical concerns.    This visit is provided free of charge (no copay) for all Medicare recipients. The clinical pharmacists at [facility] have begun to conduct these Wellness Visits which will also include a thorough review of all your medications.    As you primary medical provider recommend that you make an appointment for your Annual Wellness Visit if you have not done so already this year.  You may set up this appointment before you leave today or you may call back ([phone]) and schedule an appointment.  Please make sure when you call that you mention that you are scheduling your Annual Wellness Visit with the clinical pharmacist so that the appointment may be made for the proper length of time.     Continue current medications. Continue good therapeutic lifestyle changes which include good diet and exercise. Fall precautions discussed with patient. If an FOBT was given today- please return it to our front desk. If you are over 69 years old - you may need Prevnar 45 or the adult Pneumonia vaccine.  **Flu shots are available--- please call and schedule a FLU-CLINIC appointment**  After your visit with [place] today you will receive a survey in the mail or online from [employer] regarding your care with [place]. Please take a moment to fill this out. Your feedback is very  important to [place] as you can help [place] better understand your patient needs as well as improve your experience and satisfaction. WE CARE ABOUT YOU!!!   Keep follow-up appointment with orthopedist and discuss back pain and leg pain Also discussed the left subscapular bursitis In the meantime use warm wet compresses to this area and take Tylenol Keep follow-up appointment with cardiologist Return to clinic in a couple of weeks and we will freeze the wart on her index finger and give you your flu shot This winter drink plenty of fluids and stay well hydrated

## 2015-10-22 LAB — THYROID PANEL WITH TSH
FREE THYROXINE INDEX: 2 (ref 1.2–4.9)
T3 Uptake Ratio: 26 % (ref 24–39)
T4 TOTAL: 7.6 ug/dL (ref 4.5–12.0)
TSH: 3.13 u[IU]/mL (ref 0.450–4.500)

## 2015-10-23 ENCOUNTER — Other Ambulatory Visit (INDEPENDENT_AMBULATORY_CARE_PROVIDER_SITE_OTHER): Payer: Medicare Other

## 2015-10-25 ENCOUNTER — Other Ambulatory Visit: Payer: Self-pay | Admitting: Family Medicine

## 2015-11-01 ENCOUNTER — Ambulatory Visit (INDEPENDENT_AMBULATORY_CARE_PROVIDER_SITE_OTHER): Payer: Medicare Other

## 2015-11-01 DIAGNOSIS — Z23 Encounter for immunization: Secondary | ICD-10-CM | POA: Diagnosis not present

## 2015-11-03 ENCOUNTER — Other Ambulatory Visit (INDEPENDENT_AMBULATORY_CARE_PROVIDER_SITE_OTHER): Payer: Medicare Other

## 2015-11-03 ENCOUNTER — Other Ambulatory Visit: Payer: Self-pay | Admitting: Family Medicine

## 2015-11-03 DIAGNOSIS — Z1211 Encounter for screening for malignant neoplasm of colon: Secondary | ICD-10-CM | POA: Diagnosis not present

## 2015-11-03 DIAGNOSIS — Z1231 Encounter for screening mammogram for malignant neoplasm of breast: Secondary | ICD-10-CM

## 2015-11-03 DIAGNOSIS — R82998 Other abnormal findings in urine: Secondary | ICD-10-CM

## 2015-11-03 DIAGNOSIS — R300 Dysuria: Secondary | ICD-10-CM

## 2015-11-03 DIAGNOSIS — R8299 Other abnormal findings in urine: Secondary | ICD-10-CM | POA: Diagnosis not present

## 2015-11-04 LAB — FECAL OCCULT BLOOD, IMMUNOCHEMICAL: Fecal Occult Bld: NEGATIVE

## 2015-11-05 LAB — URINE CULTURE

## 2015-11-08 ENCOUNTER — Ambulatory Visit (INDEPENDENT_AMBULATORY_CARE_PROVIDER_SITE_OTHER): Payer: Medicare Other | Admitting: Cardiovascular Disease

## 2015-11-08 ENCOUNTER — Encounter: Payer: Self-pay | Admitting: Cardiovascular Disease

## 2015-11-08 VITALS — BP 150/78 | HR 64 | Ht 63.0 in | Wt 175.2 lb

## 2015-11-08 DIAGNOSIS — H3570 Unspecified separation of retinal layers: Secondary | ICD-10-CM | POA: Diagnosis not present

## 2015-11-08 DIAGNOSIS — I951 Orthostatic hypotension: Secondary | ICD-10-CM

## 2015-11-08 DIAGNOSIS — M25561 Pain in right knee: Secondary | ICD-10-CM | POA: Diagnosis not present

## 2015-11-08 DIAGNOSIS — R001 Bradycardia, unspecified: Secondary | ICD-10-CM | POA: Diagnosis not present

## 2015-11-08 DIAGNOSIS — I480 Paroxysmal atrial fibrillation: Secondary | ICD-10-CM

## 2015-11-08 DIAGNOSIS — I2510 Atherosclerotic heart disease of native coronary artery without angina pectoris: Secondary | ICD-10-CM | POA: Diagnosis not present

## 2015-11-08 DIAGNOSIS — H3562 Retinal hemorrhage, left eye: Secondary | ICD-10-CM | POA: Diagnosis not present

## 2015-11-08 DIAGNOSIS — I4729 Other ventricular tachycardia: Secondary | ICD-10-CM

## 2015-11-08 DIAGNOSIS — I472 Ventricular tachycardia: Secondary | ICD-10-CM | POA: Diagnosis not present

## 2015-11-08 DIAGNOSIS — H43812 Vitreous degeneration, left eye: Secondary | ICD-10-CM | POA: Diagnosis not present

## 2015-11-08 DIAGNOSIS — Z95810 Presence of automatic (implantable) cardiac defibrillator: Secondary | ICD-10-CM

## 2015-11-08 DIAGNOSIS — N183 Chronic kidney disease, stage 3 unspecified: Secondary | ICD-10-CM

## 2015-11-08 DIAGNOSIS — E7800 Pure hypercholesterolemia, unspecified: Secondary | ICD-10-CM

## 2015-11-08 DIAGNOSIS — E119 Type 2 diabetes mellitus without complications: Secondary | ICD-10-CM

## 2015-11-08 DIAGNOSIS — H353221 Exudative age-related macular degeneration, left eye, with active choroidal neovascularization: Secondary | ICD-10-CM | POA: Diagnosis not present

## 2015-11-08 DIAGNOSIS — M1711 Unilateral primary osteoarthritis, right knee: Secondary | ICD-10-CM | POA: Diagnosis not present

## 2015-11-08 DIAGNOSIS — H353212 Exudative age-related macular degeneration, right eye, with inactive choroidal neovascularization: Secondary | ICD-10-CM | POA: Diagnosis not present

## 2015-11-08 MED ORDER — NORPACE CR 100 MG PO CP12: 100.0000 mg | ORAL_CAPSULE | Freq: Every day | ORAL | 3 refills | Status: DC

## 2015-11-08 MED ORDER — LISINOPRIL 10 MG PO TABS: 10.0000 mg | ORAL_TABLET | Freq: Every day | ORAL | 3 refills | Status: DC

## 2015-11-08 MED ORDER — ROSUVASTATIN CALCIUM 20 MG PO TABS: 20.0000 mg | ORAL_TABLET | Freq: Every day | ORAL | 3 refills | Status: DC

## 2015-11-08 NOTE — Patient Instructions (Signed)
Dr Sallyanne Kuster has recommended making the following medication changes: 1. DECREASE Crestor to 20 mg daily 2. DECREASE Lisinopril to 10 mg daily 3. DECREASE Norpace to 100 mg daily  Remote monitoring is used to monitor your Pacemaker of ICD from home. This monitoring reduces the number of office visits required to check your device to one time per year. It allows [place] to keep an eye on the functioning of your device to ensure it is working properly. You are scheduled for a device check from home on Monday, January 22nd, 2018. You may send your transmission at any time that day. If you have a wireless device, the transmission will be sent automatically. After your physician reviews your transmission, you will receive a postcard with your next transmission date.  Dr Sallyanne Kuster recommends that you schedule a follow-up appointment in 6 months with a device check. You will receive a reminder letter in the mail two months in advance. If you don't receive a letter, please call our office to schedule the follow-up appointment.  If you need a refill on your cardiac medications before your next appointment, please call your pharmacy.

## 2015-11-08 NOTE — Progress Notes (Signed)
. Patient ID: [name], female   DOB: 03/25/1931, 82 y.o.   MRN: [id number]    Cardiology Office Note    Date:  11/09/2015   ID:  [name], DOB [birth year], MRN [id number]  PCP:  Redge Gainer, MD  Cardiologist:   Sanda Klein, MD   Chief Complaint  Patient presents with  . Follow-up    had a little discomfort this morning, no shortness of breath, no swelling, has some aching in legs, has occassional lightheaded and dizziness    History of Present Illness:  [name] is a 82 y.o. 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 (initial implantation 1993, new ventricular lead 2009, last generator change 2016, [employer]), history of infrequent paroxysmal atrial fibrillation (1993 and 2015), subsequent problems with coronary artery disease requiring stents (LAD and left circumflex 2003, no revascularization since, patent stents at coronary angiography in 2006, 2013 and late 2015), normal left ventricular systolic function, orthostatic hypotension, hyperlipidemia, type 2 diabetes mellitus, stage III chronic kidney disease presents with virtually   She has not had anymore episodes of syncope and has not had any falls. However she complains of feeling dizzy prematurity much on a daily basis especially if she bends over or "exerts herself too much". She also complains of generalized aches and pains.  Interrogation of her defibrillator shows no evidence of recent arrhythmia. Her estimated generator longevity is 12 years. She never requires ventricular pacing. They have not even been episodes of nonsustained ventricular tachycardia or any atrial fibrillation. Her activity level is constant at about 0.8 hours a day.  As in the past, she reports that she always felt better when she was able to obtain the brand-name sustained-release release Norpace CR and has more dizziness when taking the disopyramide. She seems to have more  problems with orthostatic hypotension and dry mouth from the immediate release preparation than she did with the sustained release. Her pharmacist has suggested that he should be able to obtain the sustained release brand-name formulation by the end of this week.  She has a history of rate related left bundle branch block. When she is in sinus rhythm she has a milder nonspecific intraventricular conduction delay and a long AV conduction time. This is the case today with a QRS duration of 122 ms and left axis deviation. QTC is 454 ms During atrial fibrillation with rapid response she had frank left bundle branch block with a QRS duration of 157 ms  Past Medical History:  Diagnosis Date  . AICD (automatic cardioverter/defibrillator) present   . Anxiety   . ARTHRITIS   . Arthritis   . CAD (coronary artery disease)    a. history of cardiac arrest 1993;  b. s/p LAD/LCX stenting in 2003;  c. 07/2011 Cath: LM nl, LAD patent stent, D1 80ost, LCX patent stent, RCA min irregs;  d. 04/2012 MV: EF 66%, no ishcemia;  e. 12/2013 Echo: Ef 55%, no rwma, Gr 1 DD, triv AI/MR, mildly dil LA;  f. 12/2013 Lexi MV: intermediate risk - apical ischemia and inf/infsept fixed defect, ? artifactual.  . CAD in native artery 12/31/2013   Previous stents to LAD and Ramus patent on cath today 12/31/13 also There is severe disease in the ostial first diagonal which is unchanged from most recent cardiac catheterization. The right coronary artery could not be engaged selectively but nonselective angiography showed no significant disease in the proximal and midsegment.     . Cataract   .  Chronic sinus bradycardia   . CKD (chronic kidney disease), stage III   . Diabetes mellitus without complication ([place])   . Diverticulosis of colon (without mention of hemorrhage) 2007   Colonoscopy   . Esophageal stricture    a. 2012 s/p dil.  . Esophagitis, unspecified    a. 2012 EGD  . EXTERNAL HEMORRHOIDS   . GERD (gastroesophageal reflux  disease)    omeprazole  . H/O hiatal hernia   . Hiatal hernia    a. 2012 EGD.  Marland Kitchen History of DVT in the past, not on Coumadin now    left  leg  . HYPERCHOLESTEROLEMIA   . Hypertension   . ICD (implantable cardiac defibrillator) in place    a. s/p initial ICD in 1993 in setting of cardiac arrest;  b. 01/2007 gen change: Guidant T135 Vitality DS VR single lead ICD.  . Macular degeneration    gets injecton in eye every 5 weeks- last injection - 05/03/2013   . Myocardial infarction 1993  . Nephrolithiasis, just saw Dr [name]   . Orthostatic hypotension   . Peripheral vascular disease ([place])    ???  . Unspecified gastritis and gastroduodenitis without mention of hemorrhage    a. 2003 EGD->not noted on 2012 EGD.    Past Surgical History:  Procedure Laterality Date  . CARDIAC CATHETERIZATION    . CARDIAC CATHETERIZATION  01/01/15   patent stents -there is severe disease in ostial 1st diag but without change.   Marland Kitchen CARDIAC DEFIBRILLATOR PLACEMENT  02/05/2007   Guidant  . CARDIOVASCULAR STRESS TEST  12/30/2013   abnormal  . coronary stents     . CYSTOSCOPY WITH URETEROSCOPY Right 05/20/2013   Procedure: CYSTOSCOPY, RIGHT URETEROSCOPY STONE EXTRACTION, Insertion of right DOUBLE J STENT ;  Surgeon: Irine Seal, MD;  Location: [facility];  Service: Urology;  Laterality: Right;  . CYSTOSCOPY WITH URETEROSCOPY AND STENT PLACEMENT N/A 06/03/2013   Procedure: SECOND LOOK CYSTOSCOPY WITH URETEROSCOPY  HOLMIUM LASER LITHO AND STONE EXTRACTION Sammie Bench ;  Surgeon: Malka So, MD;  Location: [facility];  Service: Urology;  Laterality: N/A;  . HEMORROIDECTOMY  80's  . HOLMIUM LASER APPLICATION Right 06/17/6071   Procedure: HOLMIUM LASER APPLICATION;  Surgeon: Irine Seal, MD;  Location: [facility];  Service: Urology;  Laterality: Right;  . HYSTEROSCOPY W/D&C  09/02/2010   Procedure: DILATATION AND CURETTAGE (D&C) /HYSTEROSCOPY;  Surgeon: Margarette Asal;  Location: [facility];  Service: Gynecology;  Laterality: N/A;   Dilation and Curettage with Hysteroscopy and Polypectomy  . IMPLANTABLE CARDIOVERTER DEFIBRILLATOR (ICD) GENERATOR CHANGE N/A 02/10/2014   Procedure: ICD GENERATOR CHANGE;  Surgeon: Sanda Klein, MD;  Location: [facility];  Service: Cardiovascular;  Laterality: N/A;  . LEFT HEART CATHETERIZATION WITH CORONARY ANGIOGRAM N/A 07/28/2011   Procedure: LEFT HEART CATHETERIZATION WITH CORONARY ANGIOGRAM;  Surgeon: Lorretta Harp, MD;  Location: [facility] C[facility];  Service: Cardiovascular;  Laterality: N/A;  . LEFT HEART CATHETERIZATION WITH CORONARY ANGIOGRAM N/A 12/31/2013   Procedure: LEFT HEART CATHETERIZATION WITH CORONARY ANGIOGRAM;  Surgeon: Wellington Hampshire, MD;  Location: [facility];  Service: Cardiovascular;  Laterality: N/A;    Current Medications: Outpatient Medications Prior to Visit  Medication Sig Dispense Refill  . acetaminophen (TYLENOL) 325 MG tablet Take 650 mg by mouth every 6 (six) hours as needed for mild pain or moderate pain.     . Calcium Citrate-Vitamin D (CITRACAL + D PO) Take 1 tablet by mouth daily.    . carboxymethylcellulose (  REFRESH PLUS) 0.5 % SOLN Place 1 drop into both eyes 3 (three) times daily as needed (dry eyes).     . Cholecalciferol (VITAMIN D) 1000 UNITS capsule Take 3,000 Units by mouth daily.     Marland Kitchen ELIQUIS 2.5 MG TABS tablet TAKE  (1)  TABLET TWICE A DAY. 180 tablet 1  . Ferrous Sulfate (IRON) 325 (65 FE) MG TABS Take 325 tablets by mouth daily. 30 each 0  . glucose blood (ACCU-CHEK AVIVA PLUS) test strip Use to check BG once daily.  DX: E11.9 100 each 2  . Lancet Devices (ACCU-CHEK SOFTCLIX) lancets Use to check BG once daily.  Dx:  E11.9 100 each 2  . metoprolol tartrate (LOPRESSOR) 25 MG tablet TAKE (1) TABLET TWICE A DAY AT 10 A.M. & 10 P.M. 180 tablet 3  . Multiple Vitamins-Minerals (CENTRUM SILVER ULTRA WOMENS PO) Take 1 tablet by mouth daily.    . nitroGLYCERIN (NITROSTAT) 0.4 MG SL tablet Place 1 tablet (0.4 mg total) under the tongue every 5 (five)  minutes as needed for chest pain. 25 tablet 4  . pantoprazole (PROTONIX) 40 MG tablet Take 1 tablet (40 mg total) by mouth 2 (two) times daily. 60 tablet 4  . PARoxetine (PAXIL) 20 MG tablet Take 1 tablet (20 mg total) by mouth every morning. 90 tablet 1  . torsemide (DEMADEX) 20 MG tablet Take 20 mg by mouth daily as needed (edema).    . disopyramide (NORPACE) 100 MG capsule TAKE (2) CAPSULES TWICE DAILY. 120 capsule 0  . lisinopril (PRINIVIL,ZESTRIL) 10 MG tablet TAKE 1 TABLET EVERY 12 HOURS 180 tablet 1  . NORPACE CR 100 MG 12 hr capsule Take 1 capsule (100 mg total) by mouth 2 (two) times daily. 180 capsule 2  . rosuvastatin (CRESTOR) 40 MG tablet TAKE 1 TABLET DAILY 90 tablet 0   No facility-administered medications prior to visit.      Allergies:   Adhesive [tape]; Actos [pioglitazone]; Contrast media [iodinated diagnostic agents]; Latex; and Lipitor [atorvastatin]   Social History   Social History  . Marital status: Widowed    Spouse name: N/A  . Number of children: N/A  . Years of education: N/A   Social History Main Topics  . Smoking status: Never Smoker  . Smokeless tobacco: Never Used  . Alcohol use No  . Drug use: No  . Sexual activity: No   Other Topics Concern  . None   Social History Narrative  . None     Family History:  The patient's family history includes Asthma in her brother; Atrial fibrillation in her sister; Cancer in her daughter; Diabetes in her daughter; Heart attack in her brother and father; Hyperlipidemia in her sister; Macular degeneration in her sister; Osteoporosis in her sister; Other in her father and mother; Stroke in her mother, sister, and sister; Uterine cancer in her sister.   ROS:   Please see the history of present illness.    ROS All other systems reviewed and are negative.   PHYSICAL EXAM:   VS:  BP (!) 150/78 (BP Location: Right Arm, Patient Position: Sitting, Cuff Size: Normal)   Pulse 64   Ht 5\' 3"  (1.6 m)   Wt 175 lb 4 oz  (79.5 kg)   BMI 31.04 kg/m    GEN: Well nourished, well developed, in no acute distress  HEENT: normal  Neck: no JVD, carotid bruits, or masses Cardiac: RRR; no murmurs, rubs, or gallops,no edema , healthy subclavian ICD site Respiratory:  clear to auscultation bilaterally, normal work of breathing GI: soft, nontender, nondistended, + BS MS: no deformity or atrophy  Skin: warm and dry, no rash Neuro:  Alert and Oriented x 3, Strength and sensation are intact Psych: euthymic mood, full affect  Wt Readings from Last 3 Encounters:  11/08/15 175 lb 4 oz (79.5 kg)  10/21/15 171 lb (77.6 kg)  06/07/15 171 lb 12.8 oz (77.9 kg)      Studies/Labs Reviewed:   EKG:  EKG is ordered today.  The ekg ordered today demonstrates Sinus bradycardia with first-degree AV block, left axis deviation almost meeting criteria for left anterior fascicular block, voltage criteria for LVH. The QRS measures 160 ms. QTC is 463 ms.  Recent Labs: 10/12/2015: ALT 18; BUN 14; Creatinine, Ser 1.26; Platelets 286; Potassium 4.3; Sodium 141 10/21/2015: TSH 3.130   Lipid Panel    Component Value Date/Time   CHOL 146 10/12/2015 1106   CHOL 140 07/31/2012 1019   TRIG 147 10/12/2015 1106   TRIG 115 06/07/2015 1111   TRIG 161 (H) 07/31/2012 1019   HDL 53 10/12/2015 1106   HDL 68 06/07/2015 1111   HDL 43 07/31/2012 1019   CHOLHDL 2.8 10/12/2015 1106   CHOLHDL 2.8 09/02/2010 0629   VLDL 34 09/02/2010 0629   LDLCALC 64 10/12/2015 1106   LDLCALC 65 09/09/2013 0841   LDLCALC 65 07/31/2012 1019     ASSESSMENT:    1. Catecholaminergic polymorphic ventricular tachycardia (HCC)   2. Paroxysmal atrial fibrillation, chads2 Vasc2 score of 5, on eliquis   3. CAD in native artery   4. Orthostatic hypotension   5. Chronic sinus bradycardia   6. Diabetes mellitus type 2, diet-controlled ([place])   7. CKD (chronic kidney disease) stage 3, GFR 30-59 ml/min   8. ICD (implantable cardioverter-defibrillator) in place   9.  Hyperlipidemia      PLAN:  In order of problems listed above:  1. VT: No events and no defibrillator discharges since device implantation in 1993.We'll reduce the dose of disopyramide to 100 mg once daily only. This is not a true therapeutic dose, but as she has grown older she has been more more susceptible to the side effects of the medication. If there is no recurrence of malignant arrhythmia over the next few months with probably try to discontinue it altogether. 2. AFib: This was noted at the time of her initial VT cardiac arrest, but recurred in 2015 during an episode of urosepsis. She is on appropriate anticoagulation and has not had bleeding or embolic complications 3. CAD: This became a problem long after her initial episode of VT. She required percutaneous revascularization well over 10 years ago, without any new problems since. Her most recent cardiac catheterization in 2015 did not show new stenoses. 4. Orthostatic hypotension: This seems to be worse when she takes the immediate release disopyramide. It has been well tolerated recently with the exception of the single episode of near syncope likely related to poor oral intake and heat. We'll try to get her the Norpace CR. We'll also reduce the dose of lisinopril to only 10 mg daily. 5. Bradycardia: Is related to treatment with beta blockers, but also to intrinsic conduction system disease, as evidenced by her rate related left bundle branch block. She has had numerous device placement procedures and would like to avoid addition of an atrial lead unless really necessary. For this reason she is on a relatively low dose of beta blocker. 6. DM: Diet-controlled 7. CKD: last  GFR around 45 mL/min, stable 8. ICD: Normal device function. Remote download in 3 months, office visit in 6 months.  9. HLP: Excellent LDL cholesterol on current statin regimen. Not sure if her aches and pains are related to Crestor, but she believes this may be the case.  We'll reduce the dose to 20 mg daily.   Medication Adjustments/Labs and Tests Ordered: Current medicines are reviewed at length with the patient today.  Concerns regarding medicines are outlined above.  Medication changes, Labs and Tests ordered today are listed in the Patient Instructions below. Patient Instructions  Dr Sallyanne Kuster has recommended making the following medication changes: 1. DECREASE Crestor to 20 mg daily 2. DECREASE Lisinopril to 10 mg daily 3. DECREASE Norpace to 100 mg daily  Remote monitoring is used to monitor your Pacemaker of ICD from home. This monitoring reduces the number of office visits required to check your device to one time per year. It allows [place] to keep an eye on the functioning of your device to ensure it is working properly. You are scheduled for a device check from home on Monday, January 22nd, 2018. You may send your transmission at any time that day. If you have a wireless device, the transmission will be sent automatically. After your physician reviews your transmission, you will receive a postcard with your next transmission date.  Dr Sallyanne Kuster recommends that you schedule a follow-up appointment in 6 months with a device check. You will receive a reminder letter in the mail two months in advance. If you don't receive a letter, please call our office to schedule the follow-up appointment.  If you need a refill on your cardiac medications before your next appointment, please call your pharmacy.    Signed, Sanda Klein, MD  11/09/2015 6:50 PM    [facility] [address], [place], [place]  [address] Phone: [phone]; Fax: [phone]

## 2015-11-09 ENCOUNTER — Other Ambulatory Visit (INDEPENDENT_AMBULATORY_CARE_PROVIDER_SITE_OTHER): Payer: Medicare Other

## 2015-11-09 DIAGNOSIS — R300 Dysuria: Secondary | ICD-10-CM

## 2015-11-11 LAB — URINE CULTURE

## 2015-11-12 ENCOUNTER — Ambulatory Visit: Payer: Medicare Other | Admitting: Urology

## 2015-11-15 ENCOUNTER — Telehealth: Payer: Self-pay | Admitting: Family Medicine

## 2015-11-16 ENCOUNTER — Other Ambulatory Visit: Payer: Self-pay | Admitting: *Deleted

## 2015-11-16 DIAGNOSIS — N390 Urinary tract infection, site not specified: Secondary | ICD-10-CM

## 2015-11-16 NOTE — Telephone Encounter (Signed)
Referral placed for urology

## 2015-11-22 ENCOUNTER — Other Ambulatory Visit: Payer: Self-pay | Admitting: Cardiovascular Disease

## 2015-11-22 ENCOUNTER — Other Ambulatory Visit: Payer: Self-pay | Admitting: Family Medicine

## 2015-11-22 ENCOUNTER — Other Ambulatory Visit: Payer: Self-pay | Admitting: *Deleted

## 2015-11-22 DIAGNOSIS — R140 Abdominal distension (gaseous): Secondary | ICD-10-CM

## 2015-11-22 DIAGNOSIS — R109 Unspecified abdominal pain: Secondary | ICD-10-CM

## 2015-11-22 DIAGNOSIS — M549 Dorsalgia, unspecified: Secondary | ICD-10-CM

## 2015-11-22 DIAGNOSIS — Z87440 Personal history of urinary (tract) infections: Secondary | ICD-10-CM

## 2015-11-22 NOTE — Telephone Encounter (Signed)
Rx request sent to pharmacy.  

## 2015-11-25 DIAGNOSIS — N200 Calculus of kidney: Secondary | ICD-10-CM | POA: Diagnosis not present

## 2015-11-25 DIAGNOSIS — N3020 Other chronic cystitis without hematuria: Secondary | ICD-10-CM | POA: Diagnosis not present

## 2015-11-25 DIAGNOSIS — M545 Low back pain: Secondary | ICD-10-CM | POA: Diagnosis not present

## 2015-11-29 ENCOUNTER — Telehealth: Payer: Self-pay | Admitting: Family Medicine

## 2015-11-29 NOTE — Telephone Encounter (Signed)
Pt called - she wanted to let us know that [facility] is doing to tests and she hasnt heard back from them yet

## 2015-12-13 ENCOUNTER — Ambulatory Visit: Payer: Medicare Other

## 2015-12-27 ENCOUNTER — Ambulatory Visit
Admission: RE | Admit: 2015-12-27 | Discharge: 2015-12-27 | Disposition: A | Discharge status: Home / Self Care | Payer: Medicare Other | Source: Ambulatory Visit | Attending: Family Medicine | Admitting: Family Medicine

## 2015-12-27 DIAGNOSIS — Z1231 Encounter for screening mammogram for malignant neoplasm of breast: Secondary | ICD-10-CM | POA: Diagnosis not present

## 2016-01-14 ENCOUNTER — Other Ambulatory Visit: Payer: Medicare Other

## 2016-01-14 DIAGNOSIS — R300 Dysuria: Secondary | ICD-10-CM | POA: Diagnosis not present

## 2016-01-14 LAB — URINALYSIS, COMPLETE
BILIRUBIN UA: NEGATIVE
GLUCOSE, UA: NEGATIVE
Ketones, UA: NEGATIVE
Nitrite, UA: NEGATIVE
PH UA: 5.5 (ref 5.0–7.5)
Specific Gravity, UA: 1.015 (ref 1.005–1.030)
Urobilinogen, Ur: 0.2 mg/dL (ref 0.2–1.0)

## 2016-01-14 LAB — MICROSCOPIC EXAMINATION

## 2016-01-16 LAB — URINE CULTURE

## 2016-01-18 MED ORDER — AMOXICILLIN 500 MG PO CAPS: 500.0000 mg | ORAL_CAPSULE | Freq: Three times a day (TID) | ORAL | 0 refills | Status: DC

## 2016-01-25 ENCOUNTER — Ambulatory Visit: Payer: Medicare Other | Admitting: Pharmacist

## 2016-02-01 ENCOUNTER — Encounter: Payer: Self-pay | Admitting: Family Medicine

## 2016-02-01 ENCOUNTER — Ambulatory Visit (INDEPENDENT_AMBULATORY_CARE_PROVIDER_SITE_OTHER): Payer: Medicare Other | Admitting: Family Medicine

## 2016-02-01 VITALS — BP 147/76 | HR 54 | Temp 96.1°F | Ht 63.0 in | Wt 171.0 lb

## 2016-02-01 DIAGNOSIS — E559 Vitamin D deficiency, unspecified: Secondary | ICD-10-CM | POA: Diagnosis not present

## 2016-02-01 DIAGNOSIS — E7800 Pure hypercholesterolemia, unspecified: Secondary | ICD-10-CM

## 2016-02-01 DIAGNOSIS — I480 Paroxysmal atrial fibrillation: Secondary | ICD-10-CM | POA: Diagnosis not present

## 2016-02-01 DIAGNOSIS — I10 Essential (primary) hypertension: Secondary | ICD-10-CM

## 2016-02-01 DIAGNOSIS — R109 Unspecified abdominal pain: Secondary | ICD-10-CM

## 2016-02-01 DIAGNOSIS — E119 Type 2 diabetes mellitus without complications: Secondary | ICD-10-CM

## 2016-02-01 DIAGNOSIS — M545 Low back pain: Secondary | ICD-10-CM

## 2016-02-01 DIAGNOSIS — N200 Calculus of kidney: Secondary | ICD-10-CM | POA: Diagnosis not present

## 2016-02-01 DIAGNOSIS — M25561 Pain in right knee: Secondary | ICD-10-CM

## 2016-02-01 LAB — URINALYSIS, COMPLETE
BILIRUBIN UA: NEGATIVE
Glucose, UA: NEGATIVE
KETONES UA: NEGATIVE
Leukocytes, UA: NEGATIVE
NITRITE UA: NEGATIVE
Protein, UA: NEGATIVE
RBC UA: NEGATIVE
Urobilinogen, Ur: 0.2 mg/dL (ref 0.2–1.0)
pH, UA: 5 (ref 5.0–7.5)

## 2016-02-01 LAB — MICROSCOPIC EXAMINATION: RBC, UA: NONE SEEN /hpf (ref 0–?)

## 2016-02-01 LAB — BAYER DCA HB A1C WAIVED: HB A1C: 6.5 % (ref <7.0)

## 2016-02-01 NOTE — Progress Notes (Signed)
Subjective:    Patient ID: [name], female    DOB: [birth year], 83 y.o.   MRN: [id number]  HPI Patient here today for continued urine trouble. She is now having low back pain with right side groin and abdominal pain. She finished her last antibiotic on Saturday.The patient has seen the urologist and did have a urinary tract infection and does have a non-obstructing kidney stone. Patient says her UTI symptoms or no better. She continues to have low back pain that radiates to her right abdomen and right groin. The burning is better. She finished amoxicillin this past weekend. She is also complaining with pain in her right knee. She will get a urinalysis and urine culture and sensitivity today. The patient is been seeing orthopedic surgeon regarding her right knee and has had injections in that she is still having trouble with the right knee. She has had a CT scan of the spine here but it has been a while and she's had more recent x-rays in the orthopedist office. It certainly sounds like by her history that bending over aggravates the groin pain and makes things worse and she makes an effort not to bend over because of this. I explained that where she is hurting is in the L1-L2 area. This is on the right side. She has a history of retrolisthesis and scoliosis and a lot of degenerative changes in the spine.    Patient Active Problem List   Diagnosis Date Noted  . Near syncope 05/22/2015  . Diabetes mellitus type 2, diet-controlled ([place]) 09/14/2014  . ICD (implantable cardioverter-defibrillator) battery depletion 01/20/2014  . Abnormal nuclear stress test, 12/30/13 12/31/2013  . CAD in native artery 12/31/2013  . Unstable angina, neg MI, cath stable.maybe GI 12/30/2013  . Paroxysmal atrial fibrillation, chads2 Vasc2 score of 5, on eliquis 10/16/2013  . Catecholaminergic polymorphic ventricular tachycardia ([place]) 10/16/2013  . Ureteral stone with hydronephrosis 05/20/2013  . Metabolic  syndrome 64/33/2951  . Hypotension, meds stopped this admission 07/29/2011  . Orthostatic hypotension 07/27/2011  . Chest pain, r/o cardiac, negative MI 07/25/2011  . Chronic sinus bradycardia 07/25/2011  . Automatic implantable cardioverter-defibrillator in situ 07/25/2011  . CKD (chronic kidney disease) stage 3, GFR 30-59 ml/min 07/25/2011    Class: Chronic  . History of DVT in the past, on eliquis now 07/25/2011    Class: History of  . Nephrolithiasis, just saw Dr [name] 07/25/2011    Class: History of  . DYSPHAGIA 03/24/2010  . Chronic ischemic heart disease 12/03/2007  . RECTAL BLEEDING 12/03/2007  . Diarrhea 12/03/2007  . EPIGASTRIC PAIN 12/03/2007  . Hyperlipidemia 12/02/2007    Class: History of  . EXTERNAL HEMORRHOIDS 12/02/2007    Class: History of  . ESOPHAGITIS 12/02/2007  . GASTRITIS 12/02/2007  . COLITIS 12/02/2007  . DIVERTICULOSIS, COLON 12/02/2007  . ARTHRITIS 12/02/2007    Class: Chronic   Outpatient Encounter Prescriptions as of 02/01/2016  Medication Sig  . acetaminophen (TYLENOL) 325 MG tablet Take 650 mg by mouth every 6 (six) hours as needed for mild pain or moderate pain.   . Calcium Citrate-Vitamin D (CITRACAL + D PO) Take 1 tablet by mouth daily.  . carboxymethylcellulose (REFRESH PLUS) 0.5 % SOLN Place 1 drop into both eyes 3 (three) times daily as needed (dry eyes).   . Cholecalciferol (VITAMIN D) 1000 UNITS capsule Take 3,000 Units by mouth daily.   . disopyramide (NORPACE) 100 MG capsule TAKE (2) CAPSULES TWICE DAILY.  Marland Kitchen ELIQUIS 2.5 MG  TABS tablet TAKE  (1)  TABLET TWICE A DAY.  Marland Kitchen Ferrous Sulfate (IRON) 325 (65 FE) MG TABS Take 325 tablets by mouth daily.  Marland Kitchen glucose blood (ACCU-CHEK AVIVA PLUS) test strip Use to check BG once daily.  DX: E11.9  . Lancet Devices (ACCU-CHEK SOFTCLIX) lancets Use to check BG once daily.  Dx:  E11.9  . lisinopril (PRINIVIL,ZESTRIL) 10 MG tablet Take 1 tablet (10 mg total) by mouth daily.  . metoprolol tartrate  (LOPRESSOR) 25 MG tablet TAKE (1) TABLET TWICE A DAY AT 10 A.M. & 10 P.M.  . Multiple Vitamins-Minerals (CENTRUM SILVER ULTRA WOMENS PO) Take 1 tablet by mouth daily.  . nitroGLYCERIN (NITROSTAT) 0.4 MG SL tablet Place 1 tablet (0.4 mg total) under the tongue every 5 (five) minutes as needed for chest pain.  . NORPACE CR 100 MG 12 hr capsule Take 1 capsule (100 mg total) by mouth daily.  . pantoprazole (PROTONIX) 40 MG tablet TKAE 1 TABLET 2 TIMES A DAY  . PARoxetine (PAXIL) 20 MG tablet Take 1 tablet (20 mg total) by mouth every morning.  . rosuvastatin (CRESTOR) 20 MG tablet Take 1 tablet (20 mg total) by mouth daily.  Marland Kitchen torsemide (DEMADEX) 20 MG tablet Take 20 mg by mouth daily as needed (edema).  . [DISCONTINUED] amoxicillin (AMOXIL) 500 MG capsule Take 1 capsule (500 mg total) by mouth 3 (three) times daily.   No facility-administered encounter medications on file as of 02/01/2016.       Review of Systems  Constitutional: Negative.   HENT: Negative.   Eyes: Negative.   Respiratory: Negative.   Cardiovascular: Negative.   Gastrointestinal: Positive for abdominal pain (right side ABD and groin pain).  Endocrine: Negative.   Genitourinary: Negative.   Musculoskeletal: Positive for back pain (low back pain).  Skin: Negative.   Allergic/Immunologic: Negative.   Neurological: Negative.   Hematological: Negative.   Psychiatric/Behavioral: Negative.        Objective:   Physical Exam  Constitutional: She is oriented to person, place, and time. She appears well-developed and well-nourished. No distress.  HENT:  Head: Normocephalic and atraumatic.  Right Ear: External ear normal.  Left Ear: External ear normal.  Nose: Nose normal.  Mouth/Throat: Oropharynx is clear and moist. No oropharyngeal exudate.  Eyes: Conjunctivae and EOM are normal. Pupils are equal, round, and reactive to light. Right eye exhibits no discharge. Left eye exhibits no discharge. No scleral icterus.  Neck:  Normal range of motion. Neck supple. No thyromegaly present.  No carotid bruits or thyromegaly or adenopathy  Cardiovascular: Normal rate, regular rhythm and normal heart sounds.   No murmur heard. The heart is slightly irregular at 60/m  Pulmonary/Chest: Effort normal and breath sounds normal. No respiratory distress. She has no wheezes. She has no rales.  Clear anteriorly and posteriorly  Abdominal: Soft. Bowel sounds are normal. She exhibits no mass. There is no tenderness. There is no rebound and no guarding.  Generalized abdominal tenderness especially in the upper abdomen where she's had previous pacemakers with scar tissue. No suprapubic tenderness or masses.  Musculoskeletal: Normal range of motion. She exhibits no edema.  Leg raising was good bilaterally with reflexes that were equal bilaterally.  Lymphadenopathy:    She has no cervical adenopathy.  Neurological: She is alert and oriented to person, place, and time. She has normal reflexes. No cranial nerve deficit.  Skin: Skin is warm and dry. No rash noted.  Psychiatric: She has a normal mood and affect. Her  behavior is normal. Judgment and thought content normal.  Nursing note and vitals reviewed.  BP (!) 147/76 (BP Location: Left Arm)   Pulse (!) 54   Temp (!) 96.1 F (35.6 C) (Oral)   Ht 5' 3"  (1.6 m)   Wt 171 lb (77.6 kg)   BMI 30.29 kg/m         Assessment & Plan:  1. Low back pain, unspecified back pain laterality, unspecified chronicity, with sciatica presence unspecified -Schedule visit with orthopedic specialist, Dr.Ramos - Urinalysis, Complete - Urine culture - AMB referral to orthopedics  2. Right sided abdominal pain -Check urinalysis and follow-up with urologist as needed - Urinalysis, Complete - Urine culture  3. Right knee pain, unspecified chronicity -Follow-up with Dr. Alvan Dame  4. Nephrolithiasis -Follow-up with urology as per their recommendations  5. Hyperlipidemia -Continue with aggressive  therapeutic lifestyle changes and current treatment pending results of lab work - CBC with Differential/Platelet - Lipid panel  6. Essential hypertension -Blood pressure is good today and she will continue with current treatment - BMP8+EGFR - CBC with Differential/Platelet - Hepatic function panel  7. Diabetes mellitus type 2, diet-controlled ([place]) - CBC with Differential/Platelet - Bayer DCA Hb A1c Waived  8. Vitamin D deficiency -[place] with current treatment pending results of lab work - CBC with Differential/Platelet - VITAMIN D 25 Hydroxy (Vit-D Deficiency, Fractures)  9. Paroxysmal atrial fibrillation, chads2 Vasc2 score of 5, on eliquis -Continue to follow-up with cardiology - CBC with Differential/Platelet   Patient Instructions  We will get the urine report back and call you with the results with any treatment recommendations at that time We will schedule you to see Dr.Ramos who works with Dr. Alvan Dame regarding your radiculopathy at the L1 and to area on the right side. Continue to follow-up with the urologist as he has recommended We'll call you with your blood work as soon as it becomes available Continue to follow-up with cardiology as planned   Arrie Senate MD

## 2016-02-01 NOTE — Patient Instructions (Addendum)
We will get the urine report back and call you with the results with any treatment recommendations at that time We will schedule you to see Dr.Ramos who works with Dr. Alvan Dame regarding your radiculopathy at the L1 and to area on the right side. Continue to follow-up with the urologist as he has recommended We'll call you with your blood work as soon as it becomes available Continue to follow-up with cardiology as planned

## 2016-02-02 LAB — CBC WITH DIFFERENTIAL/PLATELET
BASOS ABS: 0.1 10*3/uL (ref 0.0–0.2)
BASOS: 1 %
EOS (ABSOLUTE): 0.4 10*3/uL (ref 0.0–0.4)
Eos: 6 %
HEMOGLOBIN: 12.9 g/dL (ref 11.1–15.9)
Hematocrit: 40.2 % (ref 34.0–46.6)
IMMATURE GRANS (ABS): 0 10*3/uL (ref 0.0–0.1)
Immature Granulocytes: 1 %
LYMPHS: 28 %
Lymphocytes Absolute: 1.8 10*3/uL (ref 0.7–3.1)
MCH: 30.3 pg (ref 26.6–33.0)
MCHC: 32.1 g/dL (ref 31.5–35.7)
MCV: 94 fL (ref 79–97)
MONOCYTES: 9 %
Monocytes Absolute: 0.6 10*3/uL (ref 0.1–0.9)
NEUTROS ABS: 3.5 10*3/uL (ref 1.4–7.0)
Neutrophils: 55 %
Platelets: 256 10*3/uL (ref 150–379)
RBC: 4.26 x10E6/uL (ref 3.77–5.28)
RDW: 14.6 % (ref 12.3–15.4)
WBC: 6.4 10*3/uL (ref 3.4–10.8)

## 2016-02-02 LAB — BMP8+EGFR
BUN/Creatinine Ratio: 16 (ref 12–28)
BUN: 22 mg/dL (ref 8–27)
CALCIUM: 9.6 mg/dL (ref 8.7–10.3)
CO2: 21 mmol/L (ref 18–29)
CREATININE: 1.36 mg/dL — AB (ref 0.57–1.00)
Chloride: 100 mmol/L (ref 96–106)
GFR, EST AFRICAN AMERICAN: 42 mL/min/{1.73_m2} — AB (ref >59)
GFR, EST NON AFRICAN AMERICAN: 36 mL/min/{1.73_m2} — AB (ref >59)
Glucose: 117 mg/dL — ABNORMAL HIGH (ref 65–99)
Potassium: 5 mmol/L (ref 3.5–5.2)
Sodium: 138 mmol/L (ref 134–144)

## 2016-02-02 LAB — HEPATIC FUNCTION PANEL
ALK PHOS: 77 IU/L (ref 39–117)
ALT: 23 IU/L (ref 0–32)
AST: 22 IU/L (ref 0–40)
Albumin: 3.9 g/dL (ref 3.5–4.7)
BILIRUBIN, DIRECT: 0.14 mg/dL (ref 0.00–0.40)
Bilirubin Total: 0.6 mg/dL (ref 0.0–1.2)
TOTAL PROTEIN: 6.6 g/dL (ref 6.0–8.5)

## 2016-02-02 LAB — LIPID PANEL
CHOLESTEROL TOTAL: 180 mg/dL (ref 100–199)
Chol/HDL Ratio: 3.5 ratio units (ref 0.0–4.4)
HDL: 51 mg/dL (ref >39)
LDL CALC: 92 mg/dL (ref 0–99)
Triglycerides: 184 mg/dL — ABNORMAL HIGH (ref 0–149)
VLDL Cholesterol Cal: 37 mg/dL (ref 5–40)

## 2016-02-02 LAB — VITAMIN D 25 HYDROXY (VIT D DEFICIENCY, FRACTURES): VIT D 25 HYDROXY: 28.5 ng/mL — AB (ref 30.0–100.0)

## 2016-02-04 LAB — URINE CULTURE

## 2016-02-07 ENCOUNTER — Encounter: Payer: Medicare Other | Admitting: *Deleted

## 2016-02-07 ENCOUNTER — Telehealth: Payer: Self-pay | Admitting: Cardiology

## 2016-02-07 ENCOUNTER — Other Ambulatory Visit: Payer: Self-pay | Admitting: *Deleted

## 2016-02-07 MED ORDER — DOXYCYCLINE HYCLATE 100 MG PO TABS: 100.0000 mg | ORAL_TABLET | Freq: Two times a day (BID) | ORAL | 0 refills | Status: DC

## 2016-02-07 NOTE — Telephone Encounter (Signed)
Spoke with pt and reminded pt of remote transmission that is due today. Pt verbalized understanding.   

## 2016-02-07 NOTE — Telephone Encounter (Signed)
Spoke w/ pt and informed her that a device tech reviewed her transmission and it was normal. But we are unable to see any atrial arrhythmias because she only has a ventricular lead. Offered pt and appt w/ MD on 02-16-16 at 9:30 AM. Pt agreed.

## 2016-02-16 ENCOUNTER — Ambulatory Visit (INDEPENDENT_AMBULATORY_CARE_PROVIDER_SITE_OTHER): Payer: Medicare Other | Admitting: Cardiovascular Disease

## 2016-02-16 VITALS — BP 176/90 | HR 57 | Ht 63.0 in | Wt 179.0 lb

## 2016-02-16 DIAGNOSIS — I2510 Atherosclerotic heart disease of native coronary artery without angina pectoris: Secondary | ICD-10-CM

## 2016-02-16 DIAGNOSIS — Z4502 Encounter for adjustment and management of automatic implantable cardiac defibrillator: Secondary | ICD-10-CM

## 2016-02-16 DIAGNOSIS — I480 Paroxysmal atrial fibrillation: Secondary | ICD-10-CM

## 2016-02-16 DIAGNOSIS — R001 Bradycardia, unspecified: Secondary | ICD-10-CM | POA: Diagnosis not present

## 2016-02-16 DIAGNOSIS — N183 Chronic kidney disease, stage 3 unspecified: Secondary | ICD-10-CM

## 2016-02-16 DIAGNOSIS — Z7901 Long term (current) use of anticoagulants: Secondary | ICD-10-CM

## 2016-02-16 DIAGNOSIS — I472 Ventricular tachycardia: Secondary | ICD-10-CM

## 2016-02-16 DIAGNOSIS — E7800 Pure hypercholesterolemia, unspecified: Secondary | ICD-10-CM

## 2016-02-16 DIAGNOSIS — I4729 Other ventricular tachycardia: Secondary | ICD-10-CM

## 2016-02-16 DIAGNOSIS — E119 Type 2 diabetes mellitus without complications: Secondary | ICD-10-CM

## 2016-02-16 DIAGNOSIS — I951 Orthostatic hypotension: Secondary | ICD-10-CM

## 2016-02-16 NOTE — Progress Notes (Signed)
. Patient ID: [name], female   DOB: March 22, 1931, 83 y.o.   MRN: [id number]    Cardiology Office Note    Date:  02/17/2016   ID:  [name], DOB 02-28-1931, MRN [id number]  PCP:  Redge Gainer, MD  Cardiologist:   Sanda Klein, MD   Chief Complaint  Patient presents with  . Follow-up  . Headache  . Shortness of Breath  . Edema    History of Present Illness:  [name] is a 83 y.o. 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  She has been able to switch back to the branded sustained release Norpace CR but still has severe symptoms of orthostatic hypotension. Thankfully she has not had any new episodes of syncope and has not had falls.  She has had a few episodes of chest pressure when she lies flat in bed at night. This is then followed by an imperious needs to have a bowel movement, which is usually running. After that she is able to lie flat in bed without chest pressure or dyspnea.  This winter she has had an upper respiratory infection for she had to take doxycycline and then a urinary infection for she is still taking amoxicillin. She has developed a mild skin rash from this antibiotic.  Her activity is mostly limited by back pain and knee pain for which she takes acetaminophen. She was recently diagnosed with kidney stones and hematuria and has seen Dr. Jeffie Pollock. During imaging procedures to evaluate her kidney stones Dr. Jeffie Pollock express a lot of  concern about the abnormalities in her spine.  Interrogation of her defibrillator shows no evidence of recent atrial or ventricular arrhythmia. Her estimated generator longevity is 12 years. She only requires 1% ventricular pacing.   She has a history of rate related left bundle branch block, but today presents with left bundle branch block at a heart rate of only 60 bpm. Her pacemaker shows that her usual heart rate is 55-71 bpm.  Her electrocardiogram today shows sinus bradycardia at 57 bpm, with occasional premature atrial contractions followed by post ectopic pauses and ventricular pacing. She has first-degree AV block 278 ms, left bundle branch block and left axis deviation. Normal QTC at 389 ms.   Past Medical History:  Diagnosis Date  . AICD (automatic cardioverter/defibrillator) present   . Anxiety   . ARTHRITIS   . Arthritis   . CAD (coronary artery disease)    a. history of cardiac arrest 1993;  b. s/p LAD/LCX stenting in 2003;  c. 07/2011 Cath: LM nl, LAD patent stent, D1 80ost, LCX patent stent, RCA min irregs;  d. 04/2012 MV: EF 66%, no ishcemia;  e. 12/2013 Echo: Ef 55%, no rwma, Gr 1 DD, triv AI/MR, mildly dil LA;  f. 12/2013 Lexi MV: intermediate risk - apical ischemia and inf/infsept fixed defect, ? artifactual.  . CAD in native artery 12/31/2013   Previous stents to LAD and Ramus patent on cath today 12/31/13 also There is severe disease in the ostial first diagonal which is unchanged from most recent cardiac catheterization. The right coronary  artery could not be engaged selectively but nonselective angiography showed no significant disease in the proximal and midsegment.     . Cataract   . Chronic sinus bradycardia   . CKD (chronic kidney disease), stage III   . Diabetes mellitus without complication ([place])   . Diverticulosis of colon (without mention of hemorrhage) 2007   Colonoscopy   . Esophageal stricture    a. 2012 s/p dil.  . Esophagitis, unspecified    a. 2012 EGD    . EXTERNAL HEMORRHOIDS   . GERD (gastroesophageal reflux disease)    omeprazole  . H/O hiatal hernia   . Hiatal hernia    a. 2012 EGD.  Marland Kitchen History of DVT in the past, not on Coumadin now    left  leg  . HYPERCHOLESTEROLEMIA   . Hypertension   . ICD (implantable cardiac defibrillator) in place    a. s/p initial ICD in 1993 in setting of cardiac arrest;  b. 01/2007 gen change: Guidant T135 Vitality DS VR single lead ICD.  . Macular degeneration    gets injecton in eye every 5 weeks- last injection - 05/03/2013   . Myocardial infarction 1993  . Nephrolithiasis, just saw Dr [name]   . Orthostatic hypotension   . Peripheral vascular disease ([place])    ???  . Unspecified gastritis and gastroduodenitis without mention of hemorrhage    a. 2003 EGD->not noted on 2012 EGD.    Past Surgical History:  Procedure Laterality Date  . CARDIAC CATHETERIZATION    . CARDIAC CATHETERIZATION  01/01/15   patent stents -there is severe disease in ostial 1st diag but without change.   Marland Kitchen CARDIAC DEFIBRILLATOR PLACEMENT  02/05/2007   Guidant  . CARDIOVASCULAR STRESS TEST  12/30/2013   abnormal  . coronary stents     . CYSTOSCOPY WITH URETEROSCOPY Right 05/20/2013   Procedure: CYSTOSCOPY, RIGHT URETEROSCOPY STONE EXTRACTION, Insertion of right DOUBLE J STENT ;  Surgeon: Irine Seal, MD;  Location: [facility];  Service: Urology;  Laterality: Right;  . CYSTOSCOPY WITH URETEROSCOPY AND STENT PLACEMENT N/A 06/03/2013   Procedure: SECOND LOOK CYSTOSCOPY WITH URETEROSCOPY  HOLMIUM LASER LITHO AND STONE EXTRACTION Sammie Bench ;  Surgeon: Malka So, MD;  Location: [facility];  Service: Urology;  Laterality: N/A;  . HEMORROIDECTOMY  80's  . HOLMIUM LASER APPLICATION Right 08/23/5025   Procedure: HOLMIUM LASER APPLICATION;  Surgeon: Irine Seal, MD;  Location: [facility];  Service: Urology;  Laterality: Right;  . HYSTEROSCOPY W/D&C  09/02/2010   Procedure: DILATATION AND CURETTAGE (D&C) /HYSTEROSCOPY;  Surgeon: Margarette Asal;   Location: [facility];  Service: Gynecology;  Laterality: N/A;  Dilation and Curettage with Hysteroscopy and Polypectomy  . IMPLANTABLE CARDIOVERTER DEFIBRILLATOR (ICD) GENERATOR CHANGE N/A 02/10/2014   Procedure: ICD GENERATOR CHANGE;  Surgeon: Sanda Klein, MD;  Location: [facility];  Service: Cardiovascular;  Laterality: N/A;  . LEFT HEART CATHETERIZATION WITH CORONARY ANGIOGRAM N/A 07/28/2011   Procedure: LEFT HEART CATHETERIZATION WITH CORONARY ANGIOGRAM;  Surgeon: Lorretta Harp, MD;  Location: [facility];  Service: Cardiovascular;  Laterality: N/A;  . LEFT HEART CATHETERIZATION WITH CORONARY ANGIOGRAM N/A 12/31/2013   Procedure: LEFT HEART CATHETERIZATION WITH CORONARY ANGIOGRAM;  Surgeon: Wellington Hampshire, MD;  Location: [facility];  Service: Cardiovascular;  Laterality: N/A;    Current Medications: Outpatient Medications Prior to Visit  Medication Sig Dispense Refill  . acetaminophen (TYLENOL) 325 MG tablet Take 650 mg by mouth every 6 (six) hours as needed for  mild pain or moderate pain.     . Calcium Citrate-Vitamin D (CITRACAL + D PO) Take 1 tablet by mouth daily.    . carboxymethylcellulose (REFRESH PLUS) 0.5 % SOLN Place 1 drop into both eyes 3 (three) times daily as needed (dry eyes).     . Cholecalciferol (VITAMIN D) 1000 UNITS capsule Take 3,000 Units by mouth daily.     Marland Kitchen doxycycline (VIBRA-TABS) 100 MG tablet Take 1 tablet (100 mg total) by mouth 2 (two) times daily. 1 po bid 14 tablet 0  . ELIQUIS 2.5 MG TABS tablet TAKE  (1)  TABLET TWICE A DAY. 180 tablet 1  . Ferrous Sulfate (IRON) 325 (65 FE) MG TABS Take 325 tablets by mouth daily. 30 each 0  . glucose blood (ACCU-CHEK AVIVA PLUS) test strip Use to check BG once daily.  DX: E11.9 100 each 2  . Lancet Devices (ACCU-CHEK SOFTCLIX) lancets Use to check BG once daily.  Dx:  E11.9 100 each 2  . metoprolol tartrate (LOPRESSOR) 25 MG tablet TAKE (1) TABLET TWICE A DAY AT 10 A.M. & 10 P.M. 180 tablet 3  . Multiple  Vitamins-Minerals (CENTRUM SILVER ULTRA WOMENS PO) Take 1 tablet by mouth daily.    . nitroGLYCERIN (NITROSTAT) 0.4 MG SL tablet Place 1 tablet (0.4 mg total) under the tongue every 5 (five) minutes as needed for chest pain. 25 tablet 4  . pantoprazole (PROTONIX) 40 MG tablet TKAE 1 TABLET 2 TIMES A DAY 60 tablet 6  . PARoxetine (PAXIL) 20 MG tablet Take 1 tablet (20 mg total) by mouth every morning. 90 tablet 1  . rosuvastatin (CRESTOR) 20 MG tablet Take 1 tablet (20 mg total) by mouth daily. 90 tablet 3  . torsemide (DEMADEX) 20 MG tablet Take 20 mg by mouth daily as needed (edema).    . disopyramide (NORPACE) 100 MG capsule TAKE (2) CAPSULES TWICE DAILY. 120 capsule 2  . lisinopril (PRINIVIL,ZESTRIL) 10 MG tablet Take 1 tablet (10 mg total) by mouth daily. 90 tablet 3  . NORPACE CR 100 MG 12 hr capsule Take 1 capsule (100 mg total) by mouth daily. 90 capsule 3   No facility-administered medications prior to visit.      Allergies:   Adhesive [tape]; Actos [pioglitazone]; Contrast media [iodinated diagnostic agents]; Latex; and Lipitor [atorvastatin]   Social History   Social History  . Marital status: Widowed    Spouse name: N/A  . Number of children: N/A  . Years of education: N/A   Social History Main Topics  . Smoking status: Never Smoker  . Smokeless tobacco: Never Used  . Alcohol use No  . Drug use: No  . Sexual activity: No   Other Topics Concern  . Not on file   Social History Narrative  . No narrative on file     Family History:  The patient's family history includes Asthma in her brother; Atrial fibrillation in her sister; Cancer in her daughter; Diabetes in her daughter; Heart attack in her brother and father; Hyperlipidemia in her sister; Macular degeneration in her sister; Osteoporosis in her sister; Other in her father and mother; Stroke in her mother, sister, and sister; Uterine cancer in her sister.   ROS:   Please see the history of present illness.    ROS  All other systems reviewed and are negative.   PHYSICAL EXAM:   VS:  BP (!) 176/90   Pulse (!) 57   Ht 5\' 3"  (1.6 m)   Wt  81.2 kg (179 lb)   BMI 31.71 kg/m    GEN: Well nourished, well developed, in no acute distress  HEENT: normal  Neck: no JVD, carotid bruits, or masses Cardiac: Paradoxically split S2 RRR; no murmurs, rubs, or gallops,no edema , healthy subclavian ICD site Respiratory:  clear to auscultation bilaterally, normal work of breathing GI: soft, nontender, nondistended, + BS MS: no deformity or atrophy  Skin: warm and dry, no rash Neuro:  Alert and Oriented x 3, Strength and sensation are intact Psych: euthymic mood, full affect  Wt Readings from Last 3 Encounters:  02/16/16 81.2 kg (179 lb)  02/01/16 77.6 kg (171 lb)  11/08/15 79.5 kg (175 lb 4 oz)      Studies/Labs Reviewed:   EKG:  EKG is ordered today.  The ekg ordered today demonstrates Sinus bradycardia, PACs, post ectopic pauses with ventricular pacing, with first-degree AV block, left axis deviation, Left bundle branch block, QTC is 4389 ms.  Recent Labs: 10/21/2015: TSH 3.130 02/01/2016: ALT 23; BUN 22; Creatinine, Ser 1.36; Platelets 256; Potassium 5.0; Sodium 138   Lipid Panel    Component Value Date/Time   CHOL 180 02/01/2016 0959   CHOL 140 07/31/2012 1019   TRIG 184 (H) 02/01/2016 0959   TRIG 115 06/07/2015 1111   TRIG 161 (H) 07/31/2012 1019   HDL 51 02/01/2016 0959   HDL 68 06/07/2015 1111   HDL 43 07/31/2012 1019   CHOLHDL 3.5 02/01/2016 0959   CHOLHDL 2.8 09/02/2010 0629   VLDL 34 09/02/2010 0629   LDLCALC 92 02/01/2016 0959   LDLCALC 65 09/09/2013 0841   LDLCALC 65 07/31/2012 1019     ASSESSMENT:    1. Catecholaminergic polymorphic ventricular tachycardia (HCC)   2. Paroxysmal atrial fibrillation, chads2 Vasc2 score of 5, on eliquis   3. Long term current use of anticoagulant   4. CAD in native artery   5. Orthostatic hypotension   6. Chronic sinus bradycardia   7. Diabetes  mellitus type 2, diet-controlled ([place])   8. CKD (chronic kidney disease) stage 3, GFR 30-59 ml/min   9. ICD (implantable cardioverter-defibrillator) battery depletion   10. Hyperlipidemia      PLAN:  In order of problems listed above:  1. VT: No events and no defibrillator discharges since device implantation in 1993. He seems to be having increasing side effects from the disopyramide and we will stop it altogether. No ventricular tachycardia was seen even when she was only taking 100 mg of immediate release disopyramide once daily which was clearly not an effective dose. . 2. AFib: This was noted at the time of her initial VT cardiac arrest, but recurred in 2015 during an episode of urosepsis. And atrial fibrillation has been seen since then. She is on appropriate anticoagulation and has not had bleeding or embolic complications. 3. Eliquis: She has advanced age, but her renal function and weight are appropriate for full dose anticoagulant. Has not had any bleeding problems. 4. CAD: This became a problem long after her initial episode of VT. She required percutaneous revascularization well over 10 years ago, without any new problems since. Her most recent cardiac catheterization in 2015 did not show new stenoses. 5. Orthostatic hypotension: Hopefully this will improve after discontinuation of disopyramide. Will have to increase her lisinopril since her systolic blood pressure is substantially elevated. 6. Bradycardia: Is related to treatment with beta blockers, but also to intrinsic conduction system disease, as evidenced by her rate related left bundle branch block. She has  had numerous device placement procedures and would like to avoid addition of an atrial lead unless really necessary. She has a very low burden of ventricular pacing. If this increases as she ages, may have to upgrade her device to a dual-chamber pacemaker or defibrillator 7. DM: Diet-controlled 8. CKD: last GFR around 45  mL/min, stable 9. ICD: Normal device function. Remote download in 3 months, office visit in 6 months.  10. HLP: Excellent LDL cholesterol on current statin regimen. Not sure if her aches and pains are related to Crestor, but she believes this may be the case. After reducing the dose of statin, her LDL has increased to 92. This is not ideal, but I think is acceptable since she believes her musculoskeletal complaints are better.  Medication Adjustments/Labs and Tests Ordered: Current medicines are reviewed at length with the patient today.  Concerns regarding medicines are outlined above.  Medication changes, Labs and Tests ordered today are listed in the Patient Instructions below. Patient Instructions  Dr Sallyanne Kuster has recommended making the following medication changes: 1. STOP Disopyramide  Your physician recommends that you schedule a follow-up appointment in 3 months with a device check.  If you need a refill on your cardiac medications before your next appointment, please call your pharmacy.    Signed, Sanda Klein, MD  02/17/2016 4:34 PM    [facility] [address], [place], [place]  [address] Phone: [phone]; Fax: [phone]

## 2016-02-16 NOTE — Patient Instructions (Signed)
Dr Sallyanne Kuster has recommended making the following medication changes: 1. STOP Disopyramide  Your physician recommends that you schedule a follow-up appointment in 3 months with a device check.  If you need a refill on your cardiac medications before your next appointment, please call your pharmacy.

## 2016-02-17 ENCOUNTER — Telehealth: Payer: Self-pay | Admitting: Cardiovascular Disease

## 2016-02-17 ENCOUNTER — Encounter: Payer: Self-pay | Admitting: Cardiovascular Disease

## 2016-02-17 ENCOUNTER — Other Ambulatory Visit: Payer: Medicare Other

## 2016-02-17 DIAGNOSIS — N390 Urinary tract infection, site not specified: Secondary | ICD-10-CM | POA: Diagnosis not present

## 2016-02-17 DIAGNOSIS — Z7901 Long term (current) use of anticoagulants: Secondary | ICD-10-CM | POA: Insufficient documentation

## 2016-02-17 LAB — URINALYSIS, COMPLETE
Bilirubin, UA: NEGATIVE
GLUCOSE, UA: NEGATIVE
Ketones, UA: NEGATIVE
LEUKOCYTES UA: NEGATIVE
Nitrite, UA: NEGATIVE
PH UA: 5.5 (ref 5.0–7.5)
PROTEIN UA: NEGATIVE
RBC, UA: NEGATIVE
Specific Gravity, UA: <1.005 — ABNORMAL LOW (ref 1.005–1.030)
UUROB: 0.2 mg/dL (ref 0.2–1.0)

## 2016-02-17 LAB — MICROSCOPIC EXAMINATION
BACTERIA UA: NONE SEEN
RBC, UA: NONE SEEN /hpf (ref 0–?)
RENAL EPITHEL UA: NONE SEEN /HPF

## 2016-02-17 MED ORDER — LISINOPRIL 20 MG PO TABS: 20.0000 mg | ORAL_TABLET | Freq: Two times a day (BID) | ORAL | 1 refills | Status: DC

## 2016-02-17 MED ORDER — LISINOPRIL 10 MG PO TABS: 10.0000 mg | ORAL_TABLET | Freq: Two times a day (BID) | ORAL | 3 refills | Status: DC

## 2016-02-17 NOTE — Telephone Encounter (Signed)
I'm sorry, I meant 20 mg twice a day MCr

## 2016-02-17 NOTE — Addendum Note (Signed)
Addended by: Waylan Rocher on: 02/17/2016 03:06 PM   Modules accepted: Orders

## 2016-02-17 NOTE — Telephone Encounter (Signed)
New message   Pt verbalized that she is calling to give bp reading   02/16/2016     195/90     60  02/17/2016       165/86       61                  Rt arm   174/98    63

## 2016-02-17 NOTE — Telephone Encounter (Signed)
Spoke with pt states that Dr Laurance Flatten increase her lisinopril to 10mg  BID "awhile back"   Please advise new dosing.

## 2016-02-17 NOTE — Telephone Encounter (Signed)
Pt notified she will take 2 of the 10mg  BID until gone.  I sent refill as requested

## 2016-02-17 NOTE — Progress Notes (Unsigned)
Repeat ua for DWM

## 2016-02-17 NOTE — Telephone Encounter (Signed)
Please ask her to increase the lisinopril to 20 mg daily and call us back with her blood pressure next week. Thanks [employer]

## 2016-02-17 NOTE — Addendum Note (Signed)
Addended by: Waylan Rocher on: 02/17/2016 02:46 PM   Modules accepted: Orders

## 2016-02-19 LAB — URINE CULTURE

## 2016-02-22 ENCOUNTER — Other Ambulatory Visit: Payer: Self-pay | Admitting: Family Medicine

## 2016-02-22 ENCOUNTER — Other Ambulatory Visit: Payer: Self-pay | Admitting: Cardiovascular Disease

## 2016-02-23 DIAGNOSIS — H353221 Exudative age-related macular degeneration, left eye, with active choroidal neovascularization: Secondary | ICD-10-CM | POA: Diagnosis not present

## 2016-02-23 DIAGNOSIS — M5136 Other intervertebral disc degeneration, lumbar region: Secondary | ICD-10-CM | POA: Diagnosis not present

## 2016-02-28 LAB — CUP PACEART INCLINIC DEVICE CHECK
Date Time Interrogation Session: 20180212091028
Implantable Lead Implant Date: 20090120
Implantable Lead Location: 753860
Implantable Lead Model: 157
Implantable Lead Serial Number: 138237
Implantable Pulse Generator Implant Date: 20160126
MDC IDC PG SERIAL: 191956

## 2016-03-08 ENCOUNTER — Encounter: Payer: Self-pay | Admitting: Physician Assistant

## 2016-03-08 ENCOUNTER — Ambulatory Visit (INDEPENDENT_AMBULATORY_CARE_PROVIDER_SITE_OTHER): Payer: Medicare Other | Admitting: Physician Assistant

## 2016-03-08 VITALS — BP 130/78 | HR 59 | Temp 97.3°F | Ht 63.0 in | Wt 171.4 lb

## 2016-03-08 DIAGNOSIS — K580 Irritable bowel syndrome with diarrhea: Secondary | ICD-10-CM | POA: Diagnosis not present

## 2016-03-08 DIAGNOSIS — R300 Dysuria: Secondary | ICD-10-CM | POA: Diagnosis not present

## 2016-03-08 DIAGNOSIS — N3000 Acute cystitis without hematuria: Secondary | ICD-10-CM | POA: Diagnosis not present

## 2016-03-08 DIAGNOSIS — M7989 Other specified soft tissue disorders: Secondary | ICD-10-CM | POA: Diagnosis not present

## 2016-03-08 DIAGNOSIS — M79602 Pain in left arm: Secondary | ICD-10-CM

## 2016-03-08 DIAGNOSIS — R5383 Other fatigue: Secondary | ICD-10-CM

## 2016-03-08 LAB — URINALYSIS, ROUTINE W REFLEX MICROSCOPIC
BILIRUBIN UA: NEGATIVE
Glucose, UA: NEGATIVE
Ketones, UA: NEGATIVE
Nitrite, UA: NEGATIVE
PROTEIN UA: NEGATIVE
RBC, UA: NEGATIVE
SPEC GRAV UA: 1.01 (ref 1.005–1.030)
UUROB: 0.2 mg/dL (ref 0.2–1.0)
pH, UA: 5.5 (ref 5.0–7.5)

## 2016-03-08 LAB — MICROSCOPIC EXAMINATION: RBC, UA: NONE SEEN /hpf (ref 0–?)

## 2016-03-08 MED ORDER — AMOXICILLIN 500 MG PO CAPS: 500.0000 mg | ORAL_CAPSULE | Freq: Three times a day (TID) | ORAL | 0 refills | Status: DC

## 2016-03-08 NOTE — Patient Instructions (Signed)
Urinary tract  Acute Urinary Retention, Female Urinary retention means you are unable to pee completely or at all (empty your bladder). Follow these instructions at home:  Drink enough fluids to keep your pee (urine) clear or pale yellow.  If you are sent home with a tube that drains the bladder (catheter), there will be a drainage bag attached to it. There are two types of bags. One is big that you can wear at night without having to empty it. One is smaller and needs to be emptied more often.  Keep the drainage bag emptied.  Keep the drainage bag lower than the tube.  Only take medicine as told by your doctor. Contact a doctor if:  You have a low-grade fever.  You have spasms or you are leaking pee when you have spasms. Get help right away if:  You have chills or a fever.  Your catheter stops draining pee.  Your catheter falls out.  You have increased bleeding that does not stop after you have rested and increased the amount of fluids you had been drinking. This information is not intended to replace advice given to you by your health care provider. Make sure you discuss any questions you have with your health care provider. Document Released: 06/21/2007 Document Revised: 06/10/2015 Document Reviewed: 06/13/2012 Elsevier Interactive Patient Education  2017 [employer].

## 2016-03-12 ENCOUNTER — Other Ambulatory Visit: Payer: Self-pay | Admitting: Physician Assistant

## 2016-03-12 DIAGNOSIS — K921 Melena: Secondary | ICD-10-CM

## 2016-03-12 NOTE — Progress Notes (Signed)
BP 130/78   Pulse (!) 59   Temp 97.3 F (36.3 C) (Oral)   Ht 5\' 3"  (1.6 m)   Wt 171 lb 6.4 oz (77.7 kg)   BMI 30.36 kg/m    Subjective:    Patient ID: [name], female    DOB: 07-Mar-1931, 83 y.o.   MRN: [id number]  HPI: [name] is a 83 y.o. female presenting on 03/08/2016 for Leg Pain (left, ankle area, started bad last night, burning couple of days. shooting pains last night); Dizziness (day or two); Edema (left hand); Shoulder Pain (left achey ran down arm); and Stool Color Change (black stools)  She has multiple complaints. She has fatigue, some left arm pain and leg pain that is increasing. She has no known injury to her legs or back. Pain does shoot down the leg. Denies numbness or weakness. Dizziness with standing, but is very brief and not associated with a fall or near fall. Known history of IBS and diarrhea. States that her stools are darker and cannot think of diet or medication change that would have caused it. Plan a fecal stool card to send home. Due to age would not normal have screening colonoscopy anymore. Follow the need, nursing to call patient.  Relevant past medical, surgical, family and social history reviewed and updated as indicated. Allergies and medications reviewed and updated.  Past Medical History:  Diagnosis Date  . AICD (automatic cardioverter/defibrillator) present   . Anxiety   . ARTHRITIS   . Arthritis   . CAD (coronary artery disease)    a. history of cardiac arrest 1993;  b. s/p LAD/LCX stenting in 2003;  c. 07/2011 Cath: LM nl, LAD patent stent, D1 80ost, LCX patent stent, RCA min irregs;  d. 04/2012 MV: EF 66%, no ishcemia;  e. 12/2013 Echo: Ef 55%, no rwma, Gr 1 DD, triv AI/MR, mildly dil LA;  f. 12/2013 Lexi MV: intermediate risk - apical ischemia and inf/infsept fixed defect, ? artifactual.  . CAD in native artery 12/31/2013   Previous stents to LAD and Ramus patent on cath today 12/31/13 also There is severe disease in the ostial  first diagonal which is unchanged from most recent cardiac catheterization. The right coronary artery could not be engaged selectively but nonselective angiography showed no significant disease in the proximal and midsegment.     . Cataract   . Chronic sinus bradycardia   . CKD (chronic kidney disease), stage III   . Diabetes mellitus without complication ([place])   . Diverticulosis of colon (without mention of hemorrhage) 2007   Colonoscopy   . Esophageal stricture    a. 2012 s/p dil.  . Esophagitis, unspecified    a. 2012 EGD  . EXTERNAL HEMORRHOIDS   . GERD (gastroesophageal reflux disease)    omeprazole  . H/O hiatal hernia   . Hiatal hernia    a. 2012 EGD.  Marland Kitchen History of DVT in the past, not on Coumadin now    left  leg  . HYPERCHOLESTEROLEMIA   . Hypertension   . ICD (implantable cardiac defibrillator) in place    a. s/p initial ICD in 1993 in setting of cardiac arrest;  b. 01/2007 gen change: Guidant T135 Vitality DS VR single lead ICD.  . Macular degeneration    gets injecton in eye every 5 weeks- last injection - 05/03/2013   . Myocardial infarction 1993  . Nephrolithiasis, just saw Dr [name]   . Orthostatic hypotension   . Peripheral vascular  disease ([place])    ???  . Unspecified gastritis and gastroduodenitis without mention of hemorrhage    a. 2003 EGD->not noted on 2012 EGD.    Past Surgical History:  Procedure Laterality Date  . CARDIAC CATHETERIZATION    . CARDIAC CATHETERIZATION  01/01/15   patent stents -there is severe disease in ostial 1st diag but without change.   Marland Kitchen CARDIAC DEFIBRILLATOR PLACEMENT  02/05/2007   Guidant  . CARDIOVASCULAR STRESS TEST  12/30/2013   abnormal  . coronary stents     . CYSTOSCOPY WITH URETEROSCOPY Right 05/20/2013   Procedure: CYSTOSCOPY, RIGHT URETEROSCOPY STONE EXTRACTION, Insertion of right DOUBLE J STENT ;  Surgeon: Irine Seal, MD;  Location: [facility];  Service: Urology;  Laterality: Right;  . CYSTOSCOPY WITH URETEROSCOPY AND  STENT PLACEMENT N/A 06/03/2013   Procedure: SECOND LOOK CYSTOSCOPY WITH URETEROSCOPY  HOLMIUM LASER LITHO AND STONE EXTRACTION Sammie Bench ;  Surgeon: Malka So, MD;  Location: [facility];  Service: Urology;  Laterality: N/A;  . HEMORROIDECTOMY  80's  . HOLMIUM LASER APPLICATION Right 08/18/9935   Procedure: HOLMIUM LASER APPLICATION;  Surgeon: Irine Seal, MD;  Location: [facility];  Service: Urology;  Laterality: Right;  . HYSTEROSCOPY W/D&C  09/02/2010   Procedure: DILATATION AND CURETTAGE (D&C) /HYSTEROSCOPY;  Surgeon: Margarette Asal;  Location: [facility];  Service: Gynecology;  Laterality: N/A;  Dilation and Curettage with Hysteroscopy and Polypectomy  . IMPLANTABLE CARDIOVERTER DEFIBRILLATOR (ICD) GENERATOR CHANGE N/A 02/10/2014   Procedure: ICD GENERATOR CHANGE;  Surgeon: Sanda Klein, MD;  Location: [facility];  Service: Cardiovascular;  Laterality: N/A;  . LEFT HEART CATHETERIZATION WITH CORONARY ANGIOGRAM N/A 07/28/2011   Procedure: LEFT HEART CATHETERIZATION WITH CORONARY ANGIOGRAM;  Surgeon: Lorretta Harp, MD;  Location: [facility];  Service: Cardiovascular;  Laterality: N/A;  . LEFT HEART CATHETERIZATION WITH CORONARY ANGIOGRAM N/A 12/31/2013   Procedure: LEFT HEART CATHETERIZATION WITH CORONARY ANGIOGRAM;  Surgeon: Wellington Hampshire, MD;  Location: [facility];  Service: Cardiovascular;  Laterality: N/A;    Review of Systems  Constitutional: Positive for fatigue. Negative for chills and fever.  HENT: Positive for congestion.   Eyes: Negative.   Respiratory: Negative.  Negative for cough and wheezing.   Cardiovascular: Negative for chest pain, palpitations and leg swelling.  Gastrointestinal: Negative.   Genitourinary: Positive for difficulty urinating, dysuria and urgency. Negative for flank pain.  Musculoskeletal: Positive for arthralgias and back pain.  Neurological: Negative.     Allergies as of 03/08/2016      Reactions   Adhesive [tape] Other (See Comments)   Causes sores    Actos [pioglitazone] Swelling   Contrast Media [iodinated Diagnostic Agents] Other (See Comments)   Headache (no action/pre-med required)   Latex Rash   Lipitor [atorvastatin] Other (See Comments)   myalgia      Medication List       Accurate as of 03/08/16 11:59 PM. Always use your most recent med list.          accu-chek softclix lancets Use to check BG once daily.  Dx:  E11.9   acetaminophen 325 MG tablet Commonly known as:  TYLENOL Take 650 mg by mouth every 6 (six) hours as needed for mild pain or moderate pain.   amoxicillin 500 MG capsule Commonly known as:  AMOXIL Take 1 capsule (500 mg total) by mouth 3 (three) times daily.   carboxymethylcellulose 0.5 % Soln Commonly known as:  REFRESH PLUS Place 1 drop into both eyes 3 (  three) times daily as needed (dry eyes).   CENTRUM SILVER ULTRA WOMENS PO Take 1 tablet by mouth daily.   CITRACAL + D PO Take 1 tablet by mouth daily.   ELIQUIS 2.5 MG Tabs tablet Generic drug:  apixaban TAKE  (1)  TABLET TWICE A DAY.   glucose blood test strip Commonly known as:  ACCU-CHEK AVIVA PLUS Use to check BG once daily.  DX: E11.9   Iron 325 (65 Fe) MG Tabs Take 325 tablets by mouth daily.   lisinopril 20 MG tablet Commonly known as:  PRINIVIL,ZESTRIL Take 1 tablet (20 mg total) by mouth 2 (two) times daily.   metoprolol tartrate 25 MG tablet Commonly known as:  LOPRESSOR TAKE (1) TABLET TWICE A DAY AT 10 A.M. & 10 P.M.   nitroGLYCERIN 0.4 MG SL tablet Commonly known as:  NITROSTAT Place 1 tablet (0.4 mg total) under the tongue every 5 (five) minutes as needed for chest pain.   pantoprazole 40 MG tablet Commonly known as:  PROTONIX TKAE 1 TABLET 2 TIMES A DAY   PARoxetine 20 MG tablet Commonly known as:  PAXIL Take 1 tablet (20 mg total) by mouth every morning.   rosuvastatin 20 MG tablet Commonly known as:  CRESTOR Take 1 tablet (20 mg total) by mouth daily.   torsemide 20 MG tablet Commonly known as:   DEMADEX Take 20 mg by mouth daily as needed (edema).   Vitamin D 1000 units capsule Take 3,000 Units by mouth daily.          Objective:    BP 130/78   Pulse (!) 59   Temp 97.3 F (36.3 C) (Oral)   Ht 5\' 3"  (1.6 m)   Wt 171 lb 6.4 oz (77.7 kg)   BMI 30.36 kg/m   Allergies  Allergen Reactions  . Adhesive [Tape] Other (See Comments)    Causes sores  . Actos [Pioglitazone] Swelling  . Contrast Media [Iodinated Diagnostic Agents] Other (See Comments)    Headache (no action/pre-med required)  . Latex Rash  . Lipitor [Atorvastatin] Other (See Comments)    myalgia    Physical Exam  Constitutional: She is oriented to person, place, and time. She appears well-developed and well-nourished.  HENT:  Head: Normocephalic and atraumatic.  Right Ear: Tympanic membrane, external ear and ear canal normal.  Left Ear: Tympanic membrane, external ear and ear canal normal.  Nose: Nose normal. No rhinorrhea.  Mouth/Throat: Oropharynx is clear and moist and mucous membranes are normal. No oropharyngeal exudate or posterior oropharyngeal erythema.  Eyes: Conjunctivae and EOM are normal. Pupils are equal, round, and reactive to light.  Neck: Normal range of motion. Neck supple.  Cardiovascular: Normal rate, regular rhythm, normal heart sounds and intact distal pulses.   Pulmonary/Chest: Effort normal and breath sounds normal.  Abdominal: Soft. Bowel sounds are normal.  Neurological: She is alert and oriented to person, place, and time. She has normal reflexes.  Skin: Skin is warm and dry. No rash noted.  Psychiatric: She has a normal mood and affect. Her behavior is normal. Judgment and thought content normal.  Nursing note and vitals reviewed.   Results for orders placed or performed in visit on 03/08/16  Microscopic Examination  Result Value Ref Range   WBC, UA 6-10 (A) 0 - 5 /hpf   RBC, UA None seen 0 - 2 /hpf   Epithelial Cells (non renal) 0-10 0 - 10 /hpf   Renal Epithel, UA 0-10  (A) None seen /hpf  Bacteria, UA Few None seen/Few  Urinalysis, Routine w reflex microscopic  Result Value Ref Range   Specific Gravity, UA 1.010 1.005 - 1.030   pH, UA 5.5 5.0 - 7.5   Color, UA Yellow Yellow   Appearance Ur Clear Clear   Leukocytes, UA 1+ (A) Negative   Protein, UA Negative Negative/Trace   Glucose, UA Negative Negative   Ketones, UA Negative Negative   RBC, UA Negative Negative   Bilirubin, UA Negative Negative   Urobilinogen, Ur 0.2 0.2 - 1.0 mg/dL   Nitrite, UA Negative Negative   Microscopic Examination See below:       Assessment & Plan:   1. Left arm swelling - EKG 12-Lead  2. Left arm pain EKG, normal findings, compared with previous EKG.  3. Fatigue, unspecified type - Urinalysis, Routine w reflex microscopic  4. Irritable bowel syndrome with diarrhea Bland diet  5. Dysuria - Urinalysis, Routine w reflex microscopic - Microscopic Examination  6. Acute cystitis without hematuria - amoxicillin (AMOXIL) 500 MG capsule; Take 1 capsule (500 mg total) by mouth 3 (three) times daily.  Dispense: 30 capsule; Refill: 0  7. Black stool reported FOBT, if positive referral.  Continue all other maintenance medications as listed above.  Follow up plan: Return in about 4 weeks (around 04/05/2016) for PCP.  Educational handout given for [place] PA-C [facility] [address]  [place], [place] [address] [phone]   03/12/2016, 6:16 PM

## 2016-03-13 NOTE — Progress Notes (Signed)
Pt aware to come by for a FOBT and to return it  (per [place])

## 2016-04-19 ENCOUNTER — Ambulatory Visit (INDEPENDENT_AMBULATORY_CARE_PROVIDER_SITE_OTHER): Payer: Medicare Other | Admitting: Family Medicine

## 2016-04-19 ENCOUNTER — Ambulatory Visit: Payer: Medicare Other

## 2016-04-19 ENCOUNTER — Encounter: Payer: Self-pay | Admitting: Family Medicine

## 2016-04-19 VITALS — BP 136/82 | HR 80 | Temp 98.1°F | Ht 63.0 in | Wt 172.0 lb

## 2016-04-19 DIAGNOSIS — M5441 Lumbago with sciatica, right side: Secondary | ICD-10-CM

## 2016-04-19 DIAGNOSIS — K921 Melena: Secondary | ICD-10-CM

## 2016-04-19 DIAGNOSIS — H353212 Exudative age-related macular degeneration, right eye, with inactive choroidal neovascularization: Secondary | ICD-10-CM | POA: Diagnosis not present

## 2016-04-19 DIAGNOSIS — H353221 Exudative age-related macular degeneration, left eye, with active choroidal neovascularization: Secondary | ICD-10-CM | POA: Diagnosis not present

## 2016-04-19 DIAGNOSIS — I2510 Atherosclerotic heart disease of native coronary artery without angina pectoris: Secondary | ICD-10-CM

## 2016-04-19 DIAGNOSIS — H3562 Retinal hemorrhage, left eye: Secondary | ICD-10-CM | POA: Diagnosis not present

## 2016-04-19 DIAGNOSIS — H353122 Nonexudative age-related macular degeneration, left eye, intermediate dry stage: Secondary | ICD-10-CM | POA: Diagnosis not present

## 2016-04-19 DIAGNOSIS — H43812 Vitreous degeneration, left eye: Secondary | ICD-10-CM | POA: Diagnosis not present

## 2016-04-19 LAB — URINALYSIS, COMPLETE
BILIRUBIN UA: NEGATIVE
GLUCOSE, UA: NEGATIVE
Ketones, UA: NEGATIVE
NITRITE UA: NEGATIVE
PROTEIN UA: NEGATIVE
RBC UA: NEGATIVE
Specific Gravity, UA: <1.005 — ABNORMAL LOW (ref 1.005–1.030)
UUROB: 0.2 mg/dL (ref 0.2–1.0)
pH, UA: 5.5 (ref 5.0–7.5)

## 2016-04-19 LAB — MICROSCOPIC EXAMINATION
Epithelial Cells (non renal): NONE SEEN /hpf (ref 0–10)
RBC MICROSCOPIC, UA: NONE SEEN /HPF (ref 0–?)

## 2016-04-19 MED ORDER — PREDNISONE 20 MG PO TABS
ORAL_TABLET | ORAL | 0 refills | Status: DC
Start: 2016-04-19 — End: 2016-04-25

## 2016-04-19 MED ORDER — BACLOFEN 10 MG PO TABS: 10.0000 mg | ORAL_TABLET | Freq: Three times a day (TID) | ORAL | 0 refills | Status: DC

## 2016-04-19 NOTE — Progress Notes (Signed)
BP 136/82   Pulse 80   Temp 98.1 F (36.7 C) (Oral)   Ht 5\' 3"  (1.6 m)   Wt 172 lb (78 kg)   BMI 30.47 kg/m    Subjective:    Patient ID: [name], female    DOB: 03-01-1931, 83 y.o.   MRN: [id number]  HPI: [name] is a 83 y.o. female presenting on 04/19/2016 for Back Pain (lower right sideadiates into groin and front of right leg; began one week ago after her washing machine quit working and she had to pull a load of wet clothes out of the washer) and Chills, not feeling well (would like to check for UTI)   HPI Low back pain Patient comes in complaining of right lower back pain that radiates around to the front and into her right groin. She says it sometimes goes down into the front of her right leg as well. She said it began about one week ago after her washing machine quit working and she had to pull out a heavy load of wet is. She does not feel like she twisted or did anything at that time but that is about the time that the pain started. She is also been having some urinary frequency but denies any dysuria or hematuria. She denies any fevers or chills. She has been having some chills as well and she was having some darker stools and brought in a stool sample today so that we can run it.  Relevant past medical, surgical, family and social history reviewed and updated as indicated. Interim medical history since our last visit reviewed. Allergies and medications reviewed and updated.  Review of Systems  Constitutional: Negative for chills and fever.  Respiratory: Negative for chest tightness and shortness of breath.   Cardiovascular: Negative for chest pain and leg swelling.  Gastrointestinal: Positive for abdominal pain and blood in stool (Dark stools). Negative for constipation, diarrhea and vomiting.  Genitourinary: Positive for flank pain. Negative for difficulty urinating, dysuria, hematuria and urgency.  Musculoskeletal: Negative for gait problem.  Skin:  Negative for rash.  Neurological: Negative for light-headedness and headaches.  Psychiatric/Behavioral: Negative for agitation and behavioral problems.  All other systems reviewed and are negative.  Per HPI unless specifically indicated above     Objective:    BP 136/82   Pulse 80   Temp 98.1 F (36.7 C) (Oral)   Ht 5\' 3"  (1.6 m)   Wt 172 lb (78 kg)   BMI 30.47 kg/m   Wt Readings from Last 3 Encounters:  04/19/16 172 lb (78 kg)  03/08/16 171 lb 6.4 oz (77.7 kg)  02/16/16 179 lb (81.2 kg)    Physical Exam  Constitutional: She is oriented to person, place, and time. She appears well-developed and well-nourished. No distress.  Eyes: Conjunctivae are normal.  Cardiovascular: Normal rate, regular rhythm, normal heart sounds and intact distal pulses.   No murmur heard. Pulmonary/Chest: Effort normal and breath sounds normal. No respiratory distress. She has no wheezes.  Abdominal: Soft. Bowel sounds are normal. She exhibits no distension. There is no tenderness. There is no rebound and no guarding.  Musculoskeletal: Normal range of motion. She exhibits no edema.       Lumbar back: She exhibits tenderness (Right lower lumbar tenderness, radiates down into right lateral hip). She exhibits normal range of motion and no bony tenderness.  Neurological: She is alert and oriented to person, place, and time. Coordination normal.  Skin: Skin is warm  and dry. No rash noted. She is not diaphoretic.  Psychiatric: She has a normal mood and affect. Her behavior is normal.  Nursing note and vitals reviewed.   Results for orders placed or performed in visit on 04/19/16  Fecal occult blood, imunochemical  Result Value Ref Range   Fecal Occult Bld Negative Negative  Microscopic Examination  Result Value Ref Range   WBC, UA 0-5 0 - 5 /hpf   RBC, UA None seen 0 - 2 /hpf   Epithelial Cells (non renal) None seen 0 - 10 /hpf   Renal Epithel, UA 0-10 (A) None seen /hpf   Bacteria, UA Few (A) None  seen/Few  Urinalysis, Complete  Result Value Ref Range   Specific Gravity, UA <1.005 (L) 1.005 - 1.030   pH, UA 5.5 5.0 - 7.5   Color, UA Yellow Yellow   Appearance Ur Clear Clear   Leukocytes, UA 1+ (A) Negative   Protein, UA Negative Negative/Trace   Glucose, UA Negative Negative   Ketones, UA Negative Negative   RBC, UA Negative Negative   Bilirubin, UA Negative Negative   Urobilinogen, Ur 0.2 0.2 - 1.0 mg/dL   Nitrite, UA Negative Negative   Microscopic Examination See below:       Assessment & Plan:   Problem List Items Addressed This Visit    None    Visit Diagnoses    Acute right-sided low back pain with right-sided sciatica    -  Primary   Relevant Medications   predniSONE (DELTASONE) 20 MG tablet   baclofen (LIORESAL) 10 MG tablet   Other Relevant Orders   Urinalysis, Complete (Completed)   Black stool           Follow up plan: Return if symptoms worsen or fail to improve.  Counseling provided for all of the vaccine components Orders Placed This Encounter  Procedures  . Urinalysis, Complete    Caryl Pina, MD [facility] 04/19/2016, 4:58 PM

## 2016-04-21 LAB — FECAL OCCULT BLOOD, IMMUNOCHEMICAL: Fecal Occult Bld: NEGATIVE

## 2016-04-24 ENCOUNTER — Telehealth: Payer: Self-pay | Admitting: Family Medicine

## 2016-04-24 ENCOUNTER — Emergency Department (HOSPITAL_COMMUNITY)
Admission: EM | Admit: 2016-04-24 | Discharge: 2016-04-25 | Disposition: A | Discharge status: Home / Self Care | Payer: Medicare Other | Attending: Emergency Medicine | Admitting: Emergency Medicine

## 2016-04-24 ENCOUNTER — Encounter (HOSPITAL_COMMUNITY): Payer: Self-pay | Admitting: Emergency Medicine

## 2016-04-24 DIAGNOSIS — Z79899 Other long term (current) drug therapy: Secondary | ICD-10-CM | POA: Diagnosis not present

## 2016-04-24 DIAGNOSIS — N183 Chronic kidney disease, stage 3 (moderate): Secondary | ICD-10-CM | POA: Diagnosis not present

## 2016-04-24 DIAGNOSIS — N200 Calculus of kidney: Secondary | ICD-10-CM | POA: Diagnosis not present

## 2016-04-24 DIAGNOSIS — R911 Solitary pulmonary nodule: Secondary | ICD-10-CM | POA: Diagnosis not present

## 2016-04-24 DIAGNOSIS — R1031 Right lower quadrant pain: Secondary | ICD-10-CM | POA: Diagnosis not present

## 2016-04-24 DIAGNOSIS — I129 Hypertensive chronic kidney disease with stage 1 through stage 4 chronic kidney disease, or unspecified chronic kidney disease: Secondary | ICD-10-CM | POA: Diagnosis not present

## 2016-04-24 DIAGNOSIS — I252 Old myocardial infarction: Secondary | ICD-10-CM | POA: Insufficient documentation

## 2016-04-24 DIAGNOSIS — E1122 Type 2 diabetes mellitus with diabetic chronic kidney disease: Secondary | ICD-10-CM | POA: Insufficient documentation

## 2016-04-24 DIAGNOSIS — Z91040 Latex allergy status: Secondary | ICD-10-CM | POA: Insufficient documentation

## 2016-04-24 DIAGNOSIS — I2510 Atherosclerotic heart disease of native coronary artery without angina pectoris: Secondary | ICD-10-CM | POA: Diagnosis not present

## 2016-04-24 LAB — COMPREHENSIVE METABOLIC PANEL
ALT: 27 U/L (ref 14–54)
ANION GAP: 7 (ref 5–15)
AST: 33 U/L (ref 15–41)
Albumin: 3.7 g/dL (ref 3.5–5.0)
Alkaline Phosphatase: 64 U/L (ref 38–126)
BUN: 21 mg/dL — ABNORMAL HIGH (ref 6–20)
CO2: 28 mmol/L (ref 22–32)
CREATININE: 1.23 mg/dL — AB (ref 0.44–1.00)
Calcium: 9.1 mg/dL (ref 8.9–10.3)
Chloride: 101 mmol/L (ref 101–111)
GFR calc non Af Amer: 40 mL/min — ABNORMAL LOW (ref >60)
GFR, EST AFRICAN AMERICAN: 46 mL/min — AB (ref >60)
Glucose, Bld: 181 mg/dL — ABNORMAL HIGH (ref 65–99)
Potassium: 3.6 mmol/L (ref 3.5–5.1)
SODIUM: 136 mmol/L (ref 135–145)
Total Bilirubin: 0.9 mg/dL (ref 0.3–1.2)
Total Protein: 6.3 g/dL — ABNORMAL LOW (ref 6.5–8.1)

## 2016-04-24 LAB — URINALYSIS, ROUTINE W REFLEX MICROSCOPIC
Bilirubin Urine: NEGATIVE
GLUCOSE, UA: 50 mg/dL — AB
HGB URINE DIPSTICK: NEGATIVE
Ketones, ur: NEGATIVE mg/dL
Leukocytes, UA: NEGATIVE
Nitrite: NEGATIVE
Protein, ur: NEGATIVE mg/dL
SPECIFIC GRAVITY, URINE: 1.006 (ref 1.005–1.030)
pH: 6 (ref 5.0–8.0)

## 2016-04-24 LAB — CBC
HCT: 37 % (ref 36.0–46.0)
HEMOGLOBIN: 12.2 g/dL (ref 12.0–15.0)
MCH: 30.7 pg (ref 26.0–34.0)
MCHC: 33 g/dL (ref 30.0–36.0)
MCV: 93.2 fL (ref 78.0–100.0)
Platelets: 257 10*3/uL (ref 150–400)
RBC: 3.97 MIL/uL (ref 3.87–5.11)
RDW: 13 % (ref 11.5–15.5)
WBC: 8.7 10*3/uL (ref 4.0–10.5)

## 2016-04-24 LAB — LIPASE, BLOOD: LIPASE: 23 U/L (ref 11–51)

## 2016-04-24 NOTE — Telephone Encounter (Signed)
Call to pt She has RLQ pain with chills and diarrhea She is also unable to straighten up when standing Pt instructed to go to ED Verbalizes understanding

## 2016-04-24 NOTE — ED Triage Notes (Signed)
Pt has been having right lower abd pain that goes into her right back and abd pain. Pt went to primary MD thinking she had pulled muscle. Pain continues and has worsened.

## 2016-04-25 ENCOUNTER — Emergency Department (HOSPITAL_COMMUNITY): Payer: Medicare Other

## 2016-04-25 DIAGNOSIS — N200 Calculus of kidney: Secondary | ICD-10-CM | POA: Diagnosis not present

## 2016-04-25 DIAGNOSIS — R1031 Right lower quadrant pain: Secondary | ICD-10-CM | POA: Diagnosis not present

## 2016-04-25 MED ORDER — ONDANSETRON 4 MG PO TBDP: ORAL_TABLET | ORAL | 0 refills | Status: DC

## 2016-04-25 MED ORDER — BARIUM SULFATE 2.1 % PO SUSP
ORAL | Status: AC
  Filled 2016-04-25: qty 2

## 2016-04-25 NOTE — ED Notes (Signed)
Pt drinking oral CT contrast

## 2016-04-25 NOTE — Discharge Instructions (Signed)
1. Medications: zofran, usual home medications °2. Treatment: rest, drink plenty of fluids, advance diet slowly °3. Follow Up: Please followup with your primary doctor in 2 days for discussion of your diagnoses and further evaluation after today's visit; if you do not have a primary care doctor use the resource guide provided to find one; Please return to the ER for persistent vomiting, high fevers or worsening symptoms ° °

## 2016-04-25 NOTE — ED Provider Notes (Signed)
[place] DEPT Provider Note   CSN: [id number] Arrival date & time: 04/24/16  1640     History   Chief Complaint Chief Complaint  Patient presents with  . Abdominal Pain    HPI [name] is a 83 y.o. female with a hx of AICD, arthritis, CAD, MI, non-insulin-dependent diabetes, chronic kidney disease, kidney stones, DVT anticoagulated on Eliquis presents to the Emergency Department complaining of gradual, persistent, progressively worsening right flank and RLQ abd pain with associated nausea onset 4 days ago No aggravating or alleviating factors. Patient denies fever, chills, vomiting, diarrhea, weakness, dizziness, syncope, dysuria, hematuria, vaginal discharge, chest pain, shortness of breath.  Patient denies he was abdominal surgeries. She reports she was evaluated by her primary care physician for this several days ago and given prednisone for back pain. She reports the prednisone made her hallucinate did not help her pain. She has not attempted over-the-counter medications.   The history is provided by the patient and medical records. No language interpreter was used.    Past Medical History:  Diagnosis Date  . AICD (automatic cardioverter/defibrillator) present   . Anxiety   . ARTHRITIS   . Arthritis   . CAD (coronary artery disease)    a. history of cardiac arrest 1993;  b. s/p LAD/LCX stenting in 2003;  c. 07/2011 Cath: LM nl, LAD patent stent, D1 80ost, LCX patent stent, RCA min irregs;  d. 04/2012 MV: EF 66%, no ishcemia;  e. 12/2013 Echo: Ef 55%, no rwma, Gr 1 DD, triv AI/MR, mildly dil LA;  f. 12/2013 Lexi MV: intermediate risk - apical ischemia and inf/infsept fixed defect, ? artifactual.  . CAD in native artery 12/31/2013   Previous stents to LAD and Ramus patent on cath today 12/31/13 also There is severe disease in the ostial first diagonal which is unchanged from most recent cardiac catheterization. The right coronary artery could not be engaged selectively but  nonselective angiography showed no significant disease in the proximal and midsegment.     . Cataract   . Chronic sinus bradycardia   . CKD (chronic kidney disease), stage III   . Diabetes mellitus without complication ([place])   . Diverticulosis of colon (without mention of hemorrhage) 2007   Colonoscopy   . Esophageal stricture    a. 2012 s/p dil.  . Esophagitis, unspecified    a. 2012 EGD  . EXTERNAL HEMORRHOIDS   . GERD (gastroesophageal reflux disease)    omeprazole  . H/O hiatal hernia   . Hiatal hernia    a. 2012 EGD.  Marland Kitchen History of DVT in the past, not on Coumadin now    left  leg  . HYPERCHOLESTEROLEMIA   . Hypertension   . ICD (implantable cardiac defibrillator) in place    a. s/p initial ICD in 1993 in setting of cardiac arrest;  b. 01/2007 gen change: Guidant T135 Vitality DS VR single lead ICD.  . Macular degeneration    gets injecton in eye every 5 weeks- last injection - 05/03/2013   . Myocardial infarction 1993  . Nephrolithiasis, just saw Dr [name]   . Orthostatic hypotension   . Peripheral vascular disease ([place])    ???  . Unspecified gastritis and gastroduodenitis without mention of hemorrhage    a. 2003 EGD->not noted on 2012 EGD.    Patient Active Problem List   Diagnosis Date Noted  . Long term current use of anticoagulant 02/17/2016  . Near syncope 05/22/2015  . Diabetes mellitus type 2, diet-controlled (  [place]) 09/14/2014  . ICD (implantable cardioverter-defibrillator) battery depletion 01/20/2014  . Abnormal nuclear stress test, 12/30/13 12/31/2013  . CAD in native artery 12/31/2013  . Unstable angina, neg MI, cath stable.maybe GI 12/30/2013  . Paroxysmal atrial fibrillation, chads2 Vasc2 score of 5, on eliquis 10/16/2013  . Catecholaminergic polymorphic ventricular tachycardia ([place]) 10/16/2013  . Ureteral stone with hydronephrosis 05/20/2013  . Metabolic syndrome 51/88/4166  . Hypotension, meds stopped this admission 07/29/2011  . Orthostatic  hypotension 07/27/2011  . Chest pain, r/o cardiac, negative MI 07/25/2011  . Chronic sinus bradycardia 07/25/2011  . Automatic implantable cardioverter-defibrillator in situ 07/25/2011  . CKD (chronic kidney disease) stage 3, GFR 30-59 ml/min 07/25/2011    Class: Chronic  . History of DVT in the past, on eliquis now 07/25/2011    Class: History of  . Nephrolithiasis, just saw Dr [name] 07/25/2011    Class: History of  . DYSPHAGIA 03/24/2010  . Chronic ischemic heart disease 12/03/2007  . RECTAL BLEEDING 12/03/2007  . Irritable bowel syndrome with diarrhea 12/03/2007  . EPIGASTRIC PAIN 12/03/2007  . Hyperlipidemia 12/02/2007    Class: History of  . EXTERNAL HEMORRHOIDS 12/02/2007    Class: History of  . ESOPHAGITIS 12/02/2007  . GASTRITIS 12/02/2007  . COLITIS 12/02/2007  . DIVERTICULOSIS, COLON 12/02/2007  . ARTHRITIS 12/02/2007    Class: Chronic    Past Surgical History:  Procedure Laterality Date  . CARDIAC CATHETERIZATION    . CARDIAC CATHETERIZATION  01/01/15   patent stents -there is severe disease in ostial 1st diag but without change.   Marland Kitchen CARDIAC DEFIBRILLATOR PLACEMENT  02/05/2007   Guidant  . CARDIOVASCULAR STRESS TEST  12/30/2013   abnormal  . coronary stents     . CYSTOSCOPY WITH URETEROSCOPY Right 05/20/2013   Procedure: CYSTOSCOPY, RIGHT URETEROSCOPY STONE EXTRACTION, Insertion of right DOUBLE J STENT ;  Surgeon: Irine Seal, MD;  Location: [facility];  Service: Urology;  Laterality: Right;  . CYSTOSCOPY WITH URETEROSCOPY AND STENT PLACEMENT N/A 06/03/2013   Procedure: SECOND LOOK CYSTOSCOPY WITH URETEROSCOPY  HOLMIUM LASER LITHO AND STONE EXTRACTION Sammie Bench ;  Surgeon: Malka So, MD;  Location: [facility];  Service: Urology;  Laterality: N/A;  . HEMORROIDECTOMY  80's  . HOLMIUM LASER APPLICATION Right 0/06/3014   Procedure: HOLMIUM LASER APPLICATION;  Surgeon: Irine Seal, MD;  Location: [facility];  Service: Urology;  Laterality: Right;  . HYSTEROSCOPY W/D&C   09/02/2010   Procedure: DILATATION AND CURETTAGE (D&C) /HYSTEROSCOPY;  Surgeon: Margarette Asal;  Location: [facility];  Service: Gynecology;  Laterality: N/A;  Dilation and Curettage with Hysteroscopy and Polypectomy  . IMPLANTABLE CARDIOVERTER DEFIBRILLATOR (ICD) GENERATOR CHANGE N/A 02/10/2014   Procedure: ICD GENERATOR CHANGE;  Surgeon: Sanda Klein, MD;  Location: [facility] CATH LAB;  Service: Cardiovascular;  Laterality: N/A;  . LEFT HEART CATHETERIZATION WITH CORONARY ANGIOGRAM N/A 07/28/2011   Procedure: LEFT HEART CATHETERIZATION WITH CORONARY ANGIOGRAM;  Surgeon: Lorretta Harp, MD;  Location: [facility] CATH [facility];  Service: Cardiovascular;  Laterality: N/A;  . LEFT HEART CATHETERIZATION WITH CORONARY ANGIOGRAM N/A 12/31/2013   Procedure: LEFT HEART CATHETERIZATION WITH CORONARY ANGIOGRAM;  Surgeon: Wellington Hampshire, MD;  Location: [facility] CATH LAB;  Service: Cardiovascular;  Laterality: N/A;    OB History    No data available       Home Medications    Prior to Admission medications   Medication Sig Start Date End Date Taking? Authorizing Provider  Calcium Citrate-Vitamin D (CITRACAL + D PO) Take 1 tablet  by mouth daily.   Yes Historical Provider, MD  Cholecalciferol (VITAMIN D) 1000 UNITS capsule Take 3,000 Units by mouth daily.    Yes Historical Provider, MD  ELIQUIS 2.5 MG TABS tablet TAKE  (1)  TABLET TWICE A DAY. 02/23/16  Yes Mihai Croitoru, MD  Ferrous Sulfate (IRON) 325 (65 FE) MG TABS Take 325 tablets by mouth daily. 06/30/14  Yes Irene Shipper, MD  lisinopril (PRINIVIL,ZESTRIL) 20 MG tablet Take 1 tablet (20 mg total) by mouth 2 (two) times daily. Patient taking differently: Take 40 mg by mouth 2 (two) times daily.  02/17/16  Yes Mihai Croitoru, MD  metoprolol tartrate (LOPRESSOR) 25 MG tablet TAKE (1) TABLET TWICE A DAY AT 10 A.M. & 10 P.M. 06/08/15  Yes Mihai Croitoru, MD  Multiple Vitamins-Minerals (CENTRUM SILVER ULTRA WOMENS PO) Take 1 tablet by mouth daily.   Yes Historical Provider, MD    nitroGLYCERIN (NITROSTAT) 0.4 MG SL tablet Place 1 tablet (0.4 mg total) under the tongue every 5 (five) minutes as needed for chest pain. 12/31/13  Yes Isaiah Serge, NP  pantoprazole (PROTONIX) 40 MG tablet TKAE 1 TABLET 2 TIMES A DAY 11/22/15  Yes Mihai Croitoru, MD  PARoxetine (PAXIL) 20 MG tablet Take 1 tablet (20 mg total) by mouth every morning. 02/23/16  Yes Chipper Herb, MD  rosuvastatin (CRESTOR) 20 MG tablet Take 1 tablet (20 mg total) by mouth daily. Patient taking differently: Take 10 mg by mouth daily.  11/08/15  Yes Mihai Croitoru, MD  torsemide (DEMADEX) 20 MG tablet Take 20 mg by mouth daily as needed (edema).   Yes Historical Provider, MD  glucose blood (ACCU-CHEK AVIVA PLUS) test strip Use to check BG once daily.  DX: E11.9 09/14/14   Chipper Herb, MD  Lancet Devices [facility] lancets Use to check BG once daily.  Dx:  E11.9 09/14/14   Chipper Herb, MD  ondansetron (ZOFRAN ODT) 4 MG disintegrating tablet 4mg  ODT q4 hours prn nausea/vomit 04/25/16   Jarrett Soho Dolores Ewing, PA-C    Family History Family History  Problem Relation Age of Onset  . Stroke Mother   . Other Mother     brain tumor  . Other Father     MI  . Heart attack Father   . Stroke Sister   . Macular degeneration Sister   . Diabetes Daughter   . Cancer Daughter     ovarian  . Atrial fibrillation Sister   . Hyperlipidemia Sister   . Osteoporosis Sister   . Stroke Sister   . Uterine cancer Sister   . Heart attack Brother   . Asthma Brother   . Colon polyps Neg Hx     Social History Social History  Substance Use Topics  . Smoking status: Never Smoker  . Smokeless tobacco: Never Used  . Alcohol use No     Allergies   Adhesive [tape]; Actos [pioglitazone]; Contrast media [iodinated diagnostic agents]; Latex; and Lipitor [atorvastatin]   Review of Systems Review of Systems  Constitutional: Negative for chills and fever.  Respiratory: Negative for shortness of breath.    Cardiovascular: Negative for chest pain.  Gastrointestinal: Positive for abdominal pain and nausea. Negative for diarrhea and vomiting.  Endocrine: Negative for polydipsia, polyphagia and polyuria.  Genitourinary: Positive for flank pain. Negative for dysuria, hematuria and urgency.  Musculoskeletal: Negative for back pain.  Neurological: Negative for syncope and headaches.  All other systems reviewed and are negative.    Physical Exam Updated Vital  Signs BP (!) 197/83   Pulse (!) 58   Temp 99.1 F (37.3 C) (Oral)   Resp 15   SpO2 98%   Physical Exam  Constitutional: She appears well-developed and well-nourished. No distress.  Awake, alert, nontoxic appearance  HENT:  Head: Normocephalic and atraumatic.  Mouth/Throat: Oropharynx is clear and moist. No oropharyngeal exudate.  Eyes: Conjunctivae are normal. No scleral icterus.  Neck: Normal range of motion. Neck supple.  Cardiovascular: Normal rate, regular rhythm and intact distal pulses.   Pulmonary/Chest: Effort normal and breath sounds normal. No respiratory distress. She has no wheezes.  Equal chest expansion  Abdominal: Soft. Bowel sounds are normal. She exhibits no mass. There is tenderness in the right lower quadrant. There is CVA tenderness (right). There is no rigidity, no rebound, no guarding, no tenderness at McBurney's point and negative Murphy's sign.  Musculoskeletal: Normal range of motion. She exhibits no edema.  Neurological: She is alert.  Speech is clear and goal oriented Moves extremities without ataxia  Skin: Skin is warm and dry. She is not diaphoretic.  Psychiatric: She has a normal mood and affect.  Nursing note and vitals reviewed.    ED Treatments / Results  Labs (all labs ordered are listed, but only abnormal results are displayed) Labs Reviewed  COMPREHENSIVE METABOLIC PANEL - Abnormal; Notable for the following:       Result Value   Glucose, Bld 181 (*)    BUN 21 (*)    Creatinine, Ser  1.23 (*)    Total Protein 6.3 (*)    GFR calc non Af Amer 40 (*)    GFR calc Af Amer 46 (*)    All other components within normal limits  URINALYSIS, ROUTINE W REFLEX MICROSCOPIC - Abnormal; Notable for the following:    Color, Urine STRAW (*)    Glucose, UA 50 (*)    All other components within normal limits  LIPASE, BLOOD  CBC     Radiology Ct Abdomen Pelvis Wo Contrast  Result Date: 04/25/2016 CLINICAL DATA:  Right lower quadrant and flank pain EXAM: CT ABDOMEN AND PELVIS WITHOUT CONTRAST TECHNIQUE: Multidetector CT imaging of the abdomen and pelvis was performed following the standard protocol without IV contrast. COMPARISON:  CT abdomen pelvis 11/25/2015 FINDINGS: Lower chest: There is a nodular opacity of the posterior right lung base measuring 5 mm. Visualized lungs are otherwise clear. This opacity is unchanged compared to 03/25/2014. Hepatobiliary: Normal noncontrast appearance of the liver. No visible biliary dilatation. Normal gallbladder. Pancreas: Normal noncontrast appearance of the pancreas. No peripancreatic fluid collection. Spleen: Normal. Adrenal glands: Normal. Urinary Tract: --Right kidney/ureter: Findings of medullary nephrocalcinosis with multiple nonobstructing renal calculi measuring up to 4 mm. No hydronephrosis or perinephric stranding. The right ureter is unobstructed. --Left kidney/ureter: Renal atrophy and findings suggestive of medullary nephrocalcinosis. Nonobstructing renal calculi measure up to 4 mm. No hydronephrosis or perinephric stranding. The left ureter is unobstructed. --Urinary bladder: Unremarkable. Stomach/Bowel: No dilated loops of bowel. No evidence of colonic or enteric inflammation. No fluid collection within the abdomen. Vascular/Lymphatic: There is atherosclerotic calcification of the non aneurysmal abdominal aorta. No abdominal or pelvic lymphadenopathy. Reproductive: Normal uterus and ovaries. Musculoskeletal. Multilevel lumbar osteophytosis and  facet hypertrophy. No bony spinal canal stenosis. No lytic or blastic lesions. No fracture. IMPRESSION: 1. No obstructive uropathy or other acute abnormality of the abdomen or pelvis. 2. Unchanged appearance of bilateral medullary nephrocalcinosis. 3. Multiple nonobstructing renal calculi. Electronically Signed   By: Cletus Gash.D.  On: 04/25/2016 04:11    Procedures Procedures (including critical care time)  Medications Ordered in ED Medications  Barium Sulfate 2.1 % SUSP (not administered)     Initial Impression / Assessment and Plan / ED Course  I have reviewed the triage vital signs and the nursing notes.  Pertinent labs & imaging results that were available during my care of the patient were reviewed by me and considered in my medical decision making (see chart for details).     Pt presents with RLQ abd pain onset 4 days ago.  She is well appearing with benign abd.  Urine without evidence of urinary tract infection. Slightly elevated serum creatinine at baseline. No leukocytosis. Patient is afebrile and without hypotension.  Hypertension noted. Patient reports this is baseline for her.  No signs of hypertensive urgency.  Discussed with patient the need for close follow-up and management by their primary care physician. Also of note, patient with pulmonary nodule on CT scan. Discussed this with patient. She states her kidney doctor found this and had alerted her. She will follow-up.  She is well-appearing and repeat abdominal exam remains benign. She is to follow with her primary care physician for further evaluation and treatment. Patient states understanding and is in agreement with the plan.   Final Clinical Impressions(s) / ED Diagnoses   Final diagnoses:  Right lower quadrant abdominal pain  Pulmonary nodule    New Prescriptions New Prescriptions   ONDANSETRON (ZOFRAN ODT) 4 MG DISINTEGRATING TABLET    4mg  ODT q4 hours prn nausea/vomit     Abigail Butts,  PA-C 04/25/16 0500    Jola Schmidt, MD 04/25/16 [phone]

## 2016-04-25 NOTE — Progress Notes (Signed)
. Patient ID: [name], female   DOB: [birth year], 83 y.o.   MRN: [id number]    Cardiology Office Note    Date:  04/26/2016   ID:  [name], DOB 1931/04/19, MRN [id number]  PCP:  Redge Gainer, MD  Cardiologist:   Sanda Klein, MD   Chief Complaint  Patient presents with  . Follow-up    History of Present Illness:  [name] is a 83 y.o. 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  It has been roughly 3 months since we discontinued her long-standing treatment with disopyramide (the dose had to be repeatedly curtailed due to problems with orthostatic hypotension). Stopping this medication she feels that she can think clearer. She feels better. Her defibrillator has not recorded any ventricular arrhythmia. She has not had any new episodes of syncope and has not had falls.  He has had some bad problems with back pain that she had to go to the emergency room. She was prescribed prednisone but was only able to take it for a few days since it made her confused and disoriented. She may have to undergo spinal injection with Dr. Nelva Bush.  Interrogation of her defibrillator shows no evidence of recent atrial or ventricular arrhythmia. Her estimated generator longevity is 12 years. She only requires <1% ventricular pacing.    Past Medical History:  Diagnosis Date  . AICD (automatic cardioverter/defibrillator) present   . Anxiety   . ARTHRITIS   . Arthritis   .  CAD (coronary artery disease)    a. history of cardiac arrest 1993;  b. s/p LAD/LCX stenting in 2003;  c. 07/2011 Cath: LM nl, LAD patent stent, D1 80ost, LCX patent stent, RCA min irregs;  d. 04/2012 MV: EF 66%, no ishcemia;  e. 12/2013 Echo: Ef 55%, no rwma, Gr 1 DD, triv AI/MR, mildly dil LA;  f. 12/2013 Lexi MV: intermediate risk - apical ischemia and inf/infsept fixed defect, ? artifactual.  . CAD in native artery 12/31/2013   Previous stents to LAD and Ramus patent on cath today 12/31/13 also There is severe disease in the ostial first diagonal which is unchanged from most recent cardiac catheterization. The right coronary artery could not be engaged selectively but nonselective angiography showed no significant disease in the proximal and midsegment.     . Cataract   . Chronic sinus bradycardia   . CKD (chronic kidney disease), stage III   . Diabetes mellitus without complication ([place])   . Diverticulosis of colon (without mention of hemorrhage) 2007   Colonoscopy   . Esophageal stricture    a. 2012 s/p dil.  . Esophagitis, unspecified    a. 2012 EGD  . EXTERNAL HEMORRHOIDS   . GERD (gastroesophageal reflux disease)    omeprazole  . H/O hiatal hernia   . Hiatal hernia    a. 2012 EGD.  Marland Kitchen History of DVT in the past, not on Coumadin now    left  leg  . HYPERCHOLESTEROLEMIA   . Hypertension   . ICD (implantable cardiac defibrillator) in place    a. s/p initial ICD in  1993 in setting of cardiac arrest;  b. 01/2007 gen change: Guidant T135 Vitality DS VR single lead ICD.  . Macular degeneration    gets injecton in eye every 5 weeks- last injection - 05/03/2013   . Myocardial infarction 1993  . Nephrolithiasis, just saw Dr [name]   . Orthostatic hypotension   . Peripheral vascular disease ([place])    ???  . Unspecified gastritis and gastroduodenitis without mention of hemorrhage    a. 2003 EGD->not noted on 2012 EGD.    Past Surgical History:  Procedure Laterality Date  . CARDIAC  CATHETERIZATION    . CARDIAC CATHETERIZATION  01/01/15   patent stents -there is severe disease in ostial 1st diag but without change.   Marland Kitchen CARDIAC DEFIBRILLATOR PLACEMENT  02/05/2007   [employer]ant  . CARDIOVASCULAR STRESS TEST  12/30/2013   abnormal  . coronary stents     . CYSTOSCOPY WITH URETEROSCOPY Right 05/20/2013   Procedure: CYSTOSCOPY, RIGHT URETEROSCOPY STONE EXTRACTION, Insertion of right DOUBLE J STENT ;  Surgeon: Irine Seal, MD;  Location: [facility];  Service: Urology;  Laterality: Right;  . CYSTOSCOPY WITH URETEROSCOPY AND STENT PLACEMENT N/A 06/03/2013   Procedure: SECOND LOOK CYSTOSCOPY WITH URETEROSCOPY  HOLMIUM LASER LITHO AND STONE EXTRACTION Sammie Bench ;  Surgeon: Malka So, MD;  Location: [facility];  Service: Urology;  Laterality: N/A;  . HEMORROIDECTOMY  80's  . HOLMIUM LASER APPLICATION Right 08/19/4194   Procedure: HOLMIUM LASER APPLICATION;  Surgeon: Irine Seal, MD;  Location: [facility];  Service: Urology;  Laterality: Right;  . HYSTEROSCOPY W/D&C  09/02/2010   Procedure: DILATATION AND CURETTAGE (D&C) /HYSTEROSCOPY;  Surgeon: Margarette Asal;  Location: [facility];  Service: Gynecology;  Laterality: N/A;  Dilation and Curettage with Hysteroscopy and Polypectomy  . IMPLANTABLE CARDIOVERTER DEFIBRILLATOR (ICD) GENERATOR CHANGE N/A 02/10/2014   Procedure: ICD GENERATOR CHANGE;  Surgeon: Sanda Klein, MD;  Location: [facility];  Service: Cardiovascular;  Laterality: N/A;  . LEFT HEART CATHETERIZATION WITH CORONARY ANGIOGRAM N/A 07/28/2011   Procedure: LEFT HEART CATHETERIZATION WITH CORONARY ANGIOGRAM;  Surgeon: Lorretta Harp, MD;  Location: [facility];  Service: Cardiovascular;  Laterality: N/A;  . LEFT HEART CATHETERIZATION WITH CORONARY ANGIOGRAM N/A 12/31/2013   Procedure: LEFT HEART CATHETERIZATION WITH CORONARY ANGIOGRAM;  Surgeon: Wellington Hampshire, MD;  Location: [facility];  Service: Cardiovascular;  Laterality: N/A;    Current Medications: Outpatient Medications Prior to  Visit  Medication Sig Dispense Refill  . Calcium Citrate-Vitamin D (CITRACAL + D PO) Take 1 tablet by mouth daily.    . Cholecalciferol (VITAMIN D) 1000 UNITS capsule Take 3,000 Units by mouth daily.     Marland Kitchen ELIQUIS 2.5 MG TABS tablet TAKE  (1)  TABLET TWICE A DAY. 180 tablet 0  . Ferrous Sulfate (IRON) 325 (65 FE) MG TABS Take 325 tablets by mouth daily. 30 each 0  . glucose blood (ACCU-CHEK AVIVA PLUS) test strip Use to check BG once daily.  DX: E11.9 100 each 2  . Lancet Devices (ACCU-CHEK SOFTCLIX) lancets Use to check BG once daily.  Dx:  E11.9 100 each 2  . lisinopril (PRINIVIL,ZESTRIL) 20 MG tablet Take 1 tablet (20 mg total) by mouth 2 (two) times daily. (Patient taking differently: Take 40 mg by mouth 2 (two) times daily. ) 90 tablet 1  . Multiple Vitamins-Minerals (CENTRUM SILVER ULTRA WOMENS PO) Take 1 tablet by mouth daily.    . nitroGLYCERIN (NITROSTAT) 0.4 MG SL tablet Place 1 tablet (0.4  mg total) under the tongue every 5 (five) minutes as needed for chest pain. 25 tablet 4  . ondansetron (ZOFRAN ODT) 4 MG disintegrating tablet 4mg  ODT q4 hours prn nausea/vomit 4 tablet 0  . pantoprazole (PROTONIX) 40 MG tablet TKAE 1 TABLET 2 TIMES A DAY 60 tablet 6  . PARoxetine (PAXIL) 20 MG tablet Take 1 tablet (20 mg total) by mouth every morning. 90 tablet 0  . rosuvastatin (CRESTOR) 20 MG tablet Take 1 tablet (20 mg total) by mouth daily. (Patient taking differently: Take 10 mg by mouth daily. ) 90 tablet 3  . torsemide (DEMADEX) 20 MG tablet Take 20 mg by mouth daily as needed (edema).    . metoprolol tartrate (LOPRESSOR) 25 MG tablet TAKE (1) TABLET TWICE A DAY AT 10 A.M. & 10 P.M. (Patient not taking: Reported on 04/26/2016) 180 tablet 3   No facility-administered medications prior to visit.      Allergies:   Actos [pioglitazone]; Adhesive [tape]; Contrast media [iodinated diagnostic agents]; Latex; and Lipitor [atorvastatin]   Social History   Social History  . Marital status: Widowed     Spouse name: N/A  . Number of children: N/A  . Years of education: N/A   Social History Main Topics  . Smoking status: Never Smoker  . Smokeless tobacco: Never Used  . Alcohol use No  . Drug use: No  . Sexual activity: No   Other Topics Concern  . [name]   Social History Narrative  . [name]     Family History:  The patient's family history includes Asthma in her brother; Atrial fibrillation in her sister; Cancer in her daughter; Diabetes in her daughter; Heart attack in her brother and father; Hyperlipidemia in her sister; Macular degeneration in her sister; Osteoporosis in her sister; Other in her father and mother; Stroke in her mother, sister, and sister; Uterine cancer in her sister.   ROS:   Please see the history of present illness.    ROS All other systems reviewed and are negative.   PHYSICAL EXAM:   VS:  BP (!) 180/90   Pulse (!) 56   Ht 5\' 4"  (1.626 m)   Wt 78 kg (172 lb)   BMI 29.52 kg/m    Recheck BP 150/82 mmHg  GEN: Well nourished, well developed, in no acute distress  HEENT: normal  Neck: no JVD, carotid bruits, or masses Cardiac: Paradoxically split S2 RRR; no murmurs, rubs, or gallops,no edema , healthy subclavian ICD site Respiratory:  clear to auscultation bilaterally, normal work of breathing GI: soft, nontender, nondistended, + BS MS: no deformity or atrophy  Skin: warm and dry, no rash Neuro:  Alert and Oriented x 3, Strength and sensation are intact Psych: euthymic mood, full affect  Wt Readings from Last 3 Encounters:  04/26/16 78 kg (172 lb)  04/19/16 78 kg (172 lb)  03/08/16 77.7 kg (171 lb 6.4 oz)      Studies/Labs Reviewed:   EKG:  EKG is not ordered today.    Recent Labs: 10/21/2015: TSH 3.130 04/24/2016: ALT 27; BUN 21; Creatinine, Ser 1.23; Hemoglobin 12.2; Platelets 257; Potassium 3.6; Sodium 136   Lipid Panel    Component Value Date/Time   CHOL 180 02/01/2016 0959   CHOL 140 07/31/2012 1019   TRIG 184 (H) 02/01/2016 0959     TRIG 115 06/07/2015 1111   TRIG 161 (H) 07/31/2012 1019   HDL 51 02/01/2016 0959   HDL 68 06/07/2015 1111   HDL 43 07/31/2012  1019   CHOLHDL 3.5 02/01/2016 0959   CHOLHDL 2.8 09/02/2010 0629   VLDL 34 09/02/2010 0629   LDLCALC 92 02/01/2016 0959   LDLCALC 65 09/09/2013 0841   LDLCALC 65 07/31/2012 1019     ASSESSMENT:    1. Catecholaminergic polymorphic ventricular tachycardia (HCC)   2. Paroxysmal atrial fibrillation, chads2 Vasc2 score of 5, on eliquis   3. Long term current use of anticoagulant   4. CAD in native artery   5. Orthostatic hypotension   6. Chronic sinus bradycardia   7. Diabetes mellitus type 2, diet-controlled ([place])   8. CKD (chronic kidney disease) stage 3, GFR 30-59 ml/min   9. Automatic implantable cardioverter-defibrillator in situ   10. Hyperlipidemia   11. Chronic ischemic heart disease      PLAN:  In order of problems listed above:  1. VT: No events and no defibrillator discharges since device implantation in 1993. Disopyramide stopped 3 months ago. No ventricular tachycardia has been seen since then. 2. AFib: This was noted at the time of her initial VT cardiac arrest, but recurred in 2015 during an episode of urosepsis. No atrial fibrillation has been seen since then. She is on appropriate anticoagulation and has not had bleeding or embolic complications. If the anticoagulation needs to be interrupted for a surgical procedure such a spinal injection, the risk of embolic events to be very low. 3. Eliquis: She has advanced age, but her renal function and weight are appropriate for full dose anticoagulant. Has not had any bleeding problems. 4. CAD: This became a problem long after her initial episode of VT. She required percutaneous revascularization well over 10 years ago, without any new problems since. Her most recent cardiac catheterization in 2015 did not show new stenoses. 5. Orthostatic hypotension: Seems to be better after discontinuation of  disopyramide. Blood pressure was quite high when she initially checked into the office today, but improved after she had rested for a while. Suspect high blood pressure is largely related to back pain. No changes made to medications today 6. Bradycardia: She has a very low burden of ventricular pacing. If this increases as she ages, may have to upgrade her device to a dual-chamber pacemaker or defibrillator 7. DM: Diet-controlled 8. CKD: last GFR around 45 mL/min, stable 9. ICD: Normal device function. Remote download in 3 months, office visit in 6 months.  10. HLP:  After reducing the dose of statin, her LDL has increased to 92. This is not ideal, but I think is acceptable since she believes her musculoskeletal complaints are better.  Medication Adjustments/Labs and Tests Ordered: Current medicines are reviewed at length with the patient today.  Concerns regarding medicines are outlined above.  Medication changes, Labs and Tests ordered today are listed in the Patient Instructions below. Patient Instructions  Dr Sallyanne Kuster recommends that you continue on your current medications as directed. Please refer to the Current Medication list given to you today.  Remote monitoring is used to monitor your Pacemaker of ICD from home. This monitoring reduces the number of office visits required to check your device to one time per year. It allows [place] to keep an eye on the functioning of your device to ensure it is working properly. You are scheduled for a device check from home on Wednesday, July 11th, 2018. You may send your transmission at any time that day. If you have a wireless device, the transmission will be sent automatically. After your physician reviews your transmission, you will receive a  postcard with your next transmission date.  Dr Sallyanne Kuster recommends that you schedule a follow-up appointment in 6 months with a defibrillator check. You will receive a reminder letter in the mail two months in  advance. If you don't receive a letter, please call our office to schedule the follow-up appointment.  If you need a refill on your cardiac medications before your next appointment, please call your pharmacy.    Signed, Sanda Klein, MD  04/26/2016 1:16 PM    [facility] [address], [place], [place]  [address] Phone: [phone]; Fax: [phone]

## 2016-04-26 ENCOUNTER — Ambulatory Visit (INDEPENDENT_AMBULATORY_CARE_PROVIDER_SITE_OTHER): Payer: Medicare Other | Admitting: Cardiovascular Disease

## 2016-04-26 ENCOUNTER — Encounter: Payer: Self-pay | Admitting: Cardiovascular Disease

## 2016-04-26 VITALS — BP 180/90 | HR 56 | Ht 64.0 in | Wt 172.0 lb

## 2016-04-26 DIAGNOSIS — R001 Bradycardia, unspecified: Secondary | ICD-10-CM | POA: Diagnosis not present

## 2016-04-26 DIAGNOSIS — E7800 Pure hypercholesterolemia, unspecified: Secondary | ICD-10-CM | POA: Diagnosis not present

## 2016-04-26 DIAGNOSIS — I472 Ventricular tachycardia: Secondary | ICD-10-CM | POA: Diagnosis not present

## 2016-04-26 DIAGNOSIS — I951 Orthostatic hypotension: Secondary | ICD-10-CM

## 2016-04-26 DIAGNOSIS — Z7901 Long term (current) use of anticoagulants: Secondary | ICD-10-CM | POA: Diagnosis not present

## 2016-04-26 DIAGNOSIS — I480 Paroxysmal atrial fibrillation: Secondary | ICD-10-CM

## 2016-04-26 DIAGNOSIS — N183 Chronic kidney disease, stage 3 unspecified: Secondary | ICD-10-CM

## 2016-04-26 DIAGNOSIS — E119 Type 2 diabetes mellitus without complications: Secondary | ICD-10-CM

## 2016-04-26 DIAGNOSIS — Z95810 Presence of automatic (implantable) cardiac defibrillator: Secondary | ICD-10-CM | POA: Diagnosis not present

## 2016-04-26 DIAGNOSIS — I4729 Other ventricular tachycardia: Secondary | ICD-10-CM

## 2016-04-26 DIAGNOSIS — I259 Chronic ischemic heart disease, unspecified: Secondary | ICD-10-CM

## 2016-04-26 DIAGNOSIS — I2510 Atherosclerotic heart disease of native coronary artery without angina pectoris: Secondary | ICD-10-CM | POA: Diagnosis not present

## 2016-04-26 LAB — CUP PACEART INCLINIC DEVICE CHECK
Battery Remaining Longevity: 144 mo
Brady Statistic RV Percent Paced: 1 % — CL
HighPow Impedance: 49 Ohm
Implantable Lead Location: 753860
Lead Channel Pacing Threshold Amplitude: 0.7 V
Lead Channel Setting Pacing Amplitude: 2.2 V
Lead Channel Setting Pacing Pulse Width: 0.5 ms
Lead Channel Setting Sensing Sensitivity: 0.6 mV
MDC IDC LEAD IMPLANT DT: 20090120
MDC IDC LEAD SERIAL: 138237
MDC IDC MSMT LEADCHNL RV IMPEDANCE VALUE: 837 Ohm
MDC IDC MSMT LEADCHNL RV PACING THRESHOLD PULSEWIDTH: 0.5 ms
MDC IDC MSMT LEADCHNL RV SENSING INTR AMPL: 21 mV
MDC IDC PG IMPLANT DT: 20160126
MDC IDC PG SERIAL: 191956
MDC IDC SESS DTM: 20180411040000

## 2016-04-26 NOTE — Patient Instructions (Signed)
Dr Sallyanne Kuster recommends that you continue on your current medications as directed. Please refer to the Current Medication list given to you today.  Remote monitoring is used to monitor your Pacemaker of ICD from home. This monitoring reduces the number of office visits required to check your device to one time per year. It allows [place] to keep an eye on the functioning of your device to ensure it is working properly. You are scheduled for a device check from home on Wednesday, July 11th, 2018. You may send your transmission at any time that day. If you have a wireless device, the transmission will be sent automatically. After your physician reviews your transmission, you will receive a postcard with your next transmission date.  Dr Sallyanne Kuster recommends that you schedule a follow-up appointment in 6 months with a defibrillator check. You will receive a reminder letter in the mail two months in advance. If you don't receive a letter, please call our office to schedule the follow-up appointment.  If you need a refill on your cardiac medications before your next appointment, please call your pharmacy.

## 2016-05-16 ENCOUNTER — Other Ambulatory Visit: Payer: Self-pay | Admitting: Cardiovascular Disease

## 2016-05-16 ENCOUNTER — Other Ambulatory Visit: Payer: Self-pay | Admitting: Family Medicine

## 2016-05-17 MED ORDER — APIXABAN 5 MG PO TABS: 5.0000 mg | ORAL_TABLET | Freq: Two times a day (BID) | ORAL | 1 refills | Status: DC

## 2016-05-17 NOTE — Telephone Encounter (Signed)
Eliquis dose changed to 5mg  daily  84yo patient LFT within normal limits Scr=1.23 Wt = 78kg

## 2016-05-26 ENCOUNTER — Other Ambulatory Visit (HOSPITAL_COMMUNITY)
Admission: RE | Admit: 2016-05-26 | Discharge: 2016-05-26 | Disposition: A | Discharge status: Home / Self Care | Payer: Medicare Other | Source: Other Acute Inpatient Hospital | Attending: Urology | Admitting: Urology

## 2016-05-26 ENCOUNTER — Ambulatory Visit (INDEPENDENT_AMBULATORY_CARE_PROVIDER_SITE_OTHER): Payer: Medicare Other | Admitting: Urology

## 2016-05-26 DIAGNOSIS — R300 Dysuria: Secondary | ICD-10-CM

## 2016-05-26 DIAGNOSIS — N200 Calculus of kidney: Secondary | ICD-10-CM | POA: Diagnosis not present

## 2016-05-26 DIAGNOSIS — R351 Nocturia: Secondary | ICD-10-CM

## 2016-05-28 LAB — URINE CULTURE

## 2016-05-29 DIAGNOSIS — G8929 Other chronic pain: Secondary | ICD-10-CM | POA: Diagnosis not present

## 2016-05-29 DIAGNOSIS — M1711 Unilateral primary osteoarthritis, right knee: Secondary | ICD-10-CM | POA: Diagnosis not present

## 2016-05-29 DIAGNOSIS — M25561 Pain in right knee: Secondary | ICD-10-CM | POA: Diagnosis not present

## 2016-06-02 ENCOUNTER — Other Ambulatory Visit (HOSPITAL_COMMUNITY)
Admission: RE | Admit: 2016-06-02 | Discharge: 2016-06-02 | Disposition: A | Discharge status: Home / Self Care | Payer: Medicare Other | Source: Other Acute Inpatient Hospital | Attending: Urology | Admitting: Urology

## 2016-06-02 ENCOUNTER — Ambulatory Visit (INDEPENDENT_AMBULATORY_CARE_PROVIDER_SITE_OTHER): Payer: Medicare Other | Admitting: Urology

## 2016-06-02 DIAGNOSIS — R351 Nocturia: Secondary | ICD-10-CM

## 2016-06-02 DIAGNOSIS — N200 Calculus of kidney: Secondary | ICD-10-CM | POA: Diagnosis not present

## 2016-06-02 DIAGNOSIS — R300 Dysuria: Secondary | ICD-10-CM | POA: Diagnosis not present

## 2016-06-03 LAB — URINE CULTURE: Culture: NO GROWTH

## 2016-06-08 ENCOUNTER — Telehealth: Payer: Self-pay | Admitting: Family Medicine

## 2016-06-08 DIAGNOSIS — R300 Dysuria: Secondary | ICD-10-CM

## 2016-06-08 NOTE — Telephone Encounter (Signed)
Pt is having dysuria and incontinence Has seen Dr. Jeffie Pollock recently Has an Appt with Dr. Laurance Flatten first of June Ok'd for granddaughter to pickup cup and order placed

## 2016-06-09 ENCOUNTER — Other Ambulatory Visit: Payer: Medicare Other

## 2016-06-09 DIAGNOSIS — R300 Dysuria: Secondary | ICD-10-CM

## 2016-06-09 LAB — URINALYSIS, COMPLETE
BILIRUBIN UA: NEGATIVE
GLUCOSE, UA: NEGATIVE
Ketones, UA: NEGATIVE
Nitrite, UA: NEGATIVE
PROTEIN UA: NEGATIVE
Specific Gravity, UA: <1.005 — ABNORMAL LOW (ref 1.005–1.030)
UUROB: 0.2 mg/dL (ref 0.2–1.0)
pH, UA: 5.5 (ref 5.0–7.5)

## 2016-06-09 LAB — MICROSCOPIC EXAMINATION: WBC, UA: >30 /hpf — AB (ref 0–?)

## 2016-06-11 ENCOUNTER — Emergency Department (HOSPITAL_COMMUNITY)
Admission: EM | Admit: 2016-06-11 | Discharge: 2016-06-11 | Disposition: A | Discharge status: Home / Self Care | Payer: Medicare Other | Attending: Emergency Medicine | Admitting: Emergency Medicine

## 2016-06-11 ENCOUNTER — Emergency Department (HOSPITAL_COMMUNITY): Payer: Medicare Other

## 2016-06-11 ENCOUNTER — Encounter (HOSPITAL_COMMUNITY): Payer: Self-pay | Admitting: Emergency Medicine

## 2016-06-11 DIAGNOSIS — M545 Low back pain: Secondary | ICD-10-CM

## 2016-06-11 DIAGNOSIS — R109 Unspecified abdominal pain: Secondary | ICD-10-CM | POA: Insufficient documentation

## 2016-06-11 DIAGNOSIS — Z79899 Other long term (current) drug therapy: Secondary | ICD-10-CM | POA: Diagnosis not present

## 2016-06-11 DIAGNOSIS — M4856XA Collapsed vertebra, not elsewhere classified, lumbar region, initial encounter for fracture: Secondary | ICD-10-CM | POA: Diagnosis not present

## 2016-06-11 DIAGNOSIS — N183 Chronic kidney disease, stage 3 (moderate): Secondary | ICD-10-CM | POA: Insufficient documentation

## 2016-06-11 DIAGNOSIS — I2510 Atherosclerotic heart disease of native coronary artery without angina pectoris: Secondary | ICD-10-CM | POA: Insufficient documentation

## 2016-06-11 DIAGNOSIS — N200 Calculus of kidney: Secondary | ICD-10-CM | POA: Diagnosis not present

## 2016-06-11 DIAGNOSIS — I129 Hypertensive chronic kidney disease with stage 1 through stage 4 chronic kidney disease, or unspecified chronic kidney disease: Secondary | ICD-10-CM | POA: Insufficient documentation

## 2016-06-11 DIAGNOSIS — E1122 Type 2 diabetes mellitus with diabetic chronic kidney disease: Secondary | ICD-10-CM | POA: Diagnosis not present

## 2016-06-11 DIAGNOSIS — G8929 Other chronic pain: Secondary | ICD-10-CM | POA: Diagnosis not present

## 2016-06-11 DIAGNOSIS — S32010A Wedge compression fracture of first lumbar vertebra, initial encounter for closed fracture: Secondary | ICD-10-CM

## 2016-06-11 HISTORY — DX: Dorsalgia, unspecified: M54.9

## 2016-06-11 HISTORY — DX: Other chronic pain: G89.29

## 2016-06-11 LAB — DIFFERENTIAL
Basophils Absolute: 0 10*3/uL (ref 0.0–0.1)
Basophils Relative: 0 %
EOS ABS: 0.4 10*3/uL (ref 0.0–0.7)
Eosinophils Relative: 5 %
Lymphocytes Relative: 20 %
Lymphs Abs: 1.4 10*3/uL (ref 0.7–4.0)
MONO ABS: 0.7 10*3/uL (ref 0.1–1.0)
MONOS PCT: 11 %
NEUTROS ABS: 4.3 10*3/uL (ref 1.7–7.7)
Neutrophils Relative %: 64 %

## 2016-06-11 LAB — CBC
HEMATOCRIT: 41.8 % (ref 36.0–46.0)
HEMOGLOBIN: 13.6 g/dL (ref 12.0–15.0)
MCH: 30.9 pg (ref 26.0–34.0)
MCHC: 32.5 g/dL (ref 30.0–36.0)
MCV: 95 fL (ref 78.0–100.0)
Platelets: 286 10*3/uL (ref 150–400)
RBC: 4.4 MIL/uL (ref 3.87–5.11)
RDW: 12.9 % (ref 11.5–15.5)
WBC: 7 10*3/uL (ref 4.0–10.5)

## 2016-06-11 LAB — COMPREHENSIVE METABOLIC PANEL WITH GFR
ALT: 20 U/L (ref 14–54)
AST: 23 U/L (ref 15–41)
Albumin: 3.9 g/dL (ref 3.5–5.0)
Alkaline Phosphatase: 119 U/L (ref 38–126)
Anion gap: 10 (ref 5–15)
BUN: 19 mg/dL (ref 6–20)
CO2: 26 mmol/L (ref 22–32)
Calcium: 9.5 mg/dL (ref 8.9–10.3)
Chloride: 104 mmol/L (ref 101–111)
Creatinine, Ser: 1.03 mg/dL — ABNORMAL HIGH (ref 0.44–1.00)
GFR calc Af Amer: 57 mL/min — ABNORMAL LOW (ref >60)
GFR calc non Af Amer: 49 mL/min — ABNORMAL LOW (ref >60)
Glucose, Bld: 191 mg/dL — ABNORMAL HIGH (ref 65–99)
Potassium: 4.3 mmol/L (ref 3.5–5.1)
Sodium: 140 mmol/L (ref 135–145)
Total Bilirubin: 0.8 mg/dL (ref 0.3–1.2)
Total Protein: 7.5 g/dL (ref 6.5–8.1)

## 2016-06-11 LAB — URINALYSIS, ROUTINE W REFLEX MICROSCOPIC
Bilirubin Urine: NEGATIVE
Glucose, UA: NEGATIVE mg/dL
Ketones, ur: NEGATIVE mg/dL
Nitrite: NEGATIVE
Protein, ur: NEGATIVE mg/dL
Specific Gravity, Urine: 1.01 (ref 1.005–1.030)
pH: 6 (ref 5.0–8.0)

## 2016-06-11 LAB — URINALYSIS, MICROSCOPIC (REFLEX)

## 2016-06-11 MED ORDER — OXYCODONE-ACETAMINOPHEN 5-325 MG PO TABS: ORAL_TABLET | ORAL | 0 refills | Status: DC

## 2016-06-11 MED ORDER — METHOCARBAMOL 500 MG PO TABS: 500.0000 mg | ORAL_TABLET | Freq: Two times a day (BID) | ORAL | 0 refills | Status: DC

## 2016-06-11 MED ORDER — MORPHINE SULFATE (PF) 2 MG/ML IV SOLN
2.0000 mg | INTRAVENOUS | Status: DC | PRN
  Administered 2016-06-11: 2 mg via INTRAVENOUS
  Filled 2016-06-11: qty 1

## 2016-06-11 NOTE — ED Provider Notes (Signed)
[facility] DEPT Provider Note   CSN: [id number] Arrival date & time: 06/11/16  1140     History   Chief Complaint Chief Complaint  Patient presents with  . Flank Pain    HPI [name] is a 83 y.o. female.   Flank Pain     Pt was seen at 1215. Per pt, c/o gradual onset and persistence of constant right sided low back "pain" for the past several weeks, worse over the past 2 days. Pain worsens with palpation of the area and body position changes. Denies incont/retention of bowel or bladder, no saddle anesthesia, no focal motor weakness, no tingling/numbness in extremities, no fevers, no injury, no abd pain, no N/V/D.   The symptoms have been associated with no other complaints. The patient has a significant history of similar symptoms previously, recently being evaluated for this complaint and multiple prior evals for same, with previous ED evaluation last month with reassuring workup (including CT scan). Pt states she was evaluated by her PMD for dysuria last week and was told she "did not have a UTI."   Ortho: [facility] Past Medical History:  Diagnosis Date  . AICD (automatic cardioverter/defibrillator) present   . Anxiety   . ARTHRITIS   . Arthritis   . CAD (coronary artery disease)    a. history of cardiac arrest 1993;  b. s/p LAD/LCX stenting in 2003;  c. 07/2011 Cath: LM nl, LAD patent stent, D1 80ost, LCX patent stent, RCA min irregs;  d. 04/2012 MV: EF 66%, no ishcemia;  e. 12/2013 Echo: Ef 55%, no rwma, Gr 1 DD, triv AI/MR, mildly dil LA;  f. 12/2013 Lexi MV: intermediate risk - apical ischemia and inf/infsept fixed defect, ? artifactual.  . CAD in native artery 12/31/2013   Previous stents to LAD and Ramus patent on cath today 12/31/13 also There is severe disease in the ostial first diagonal which is unchanged from most recent cardiac catheterization. The right coronary artery could not be engaged selectively but nonselective angiography showed no significant  disease in the proximal and midsegment.     . Cataract   . Chronic back pain   . Chronic sinus bradycardia   . CKD (chronic kidney disease), stage III   . Diabetes mellitus without complication ([place])   . Diverticulosis of colon (without mention of hemorrhage) 2007   Colonoscopy   . Esophageal stricture    a. 2012 s/p dil.  . Esophagitis, unspecified    a. 2012 EGD  . EXTERNAL HEMORRHOIDS   . GERD (gastroesophageal reflux disease)    omeprazole  . H/O hiatal hernia   . Hiatal hernia    a. 2012 EGD.  Marland Kitchen History of DVT in the past, not on Coumadin now    left  leg  . HYPERCHOLESTEROLEMIA   . Hypertension   . ICD (implantable cardiac defibrillator) in place    a. s/p initial ICD in 1993 in setting of cardiac arrest;  b. 01/2007 gen change: Guidant T135 Vitality DS VR single lead ICD.  . Macular degeneration    gets injecton in eye every 5 weeks- last injection - 05/03/2013   . Myocardial infarction ([place]) 1993  . Nephrolithiasis, just saw Dr [name]   . Orthostatic hypotension   . Peripheral vascular disease ([place])    ???  . Unspecified gastritis and gastroduodenitis without mention of hemorrhage    a. 2003 EGD->not noted on 2012 EGD.    Patient Active Problem List   Diagnosis Date Noted  .  Long term current use of anticoagulant 02/17/2016  . Near syncope 05/22/2015  . Diabetes mellitus type 2, diet-controlled ([place]) 09/14/2014  . ICD (implantable cardioverter-defibrillator) battery depletion 01/20/2014  . Abnormal nuclear stress test, 12/30/13 12/31/2013  . CAD in native artery 12/31/2013  . Unstable angina, neg MI, cath stable.maybe GI 12/30/2013  . Paroxysmal atrial fibrillation, chads2 Vasc2 score of 5, on eliquis 10/16/2013  . Catecholaminergic polymorphic ventricular tachycardia ([place]) 10/16/2013  . Ureteral stone with hydronephrosis 05/20/2013  . Metabolic syndrome 16/94/5038  . Hypotension, meds stopped this admission 07/29/2011  . Orthostatic hypotension 07/27/2011   . Chest pain, r/o cardiac, negative MI 07/25/2011  . Chronic sinus bradycardia 07/25/2011  . Automatic implantable cardioverter-defibrillator in situ 07/25/2011  . CKD (chronic kidney disease) stage 3, GFR 30-59 ml/min 07/25/2011    Class: Chronic  . History of DVT in the past, on eliquis now 07/25/2011    Class: History of  . Nephrolithiasis, just saw Dr [name] 07/25/2011    Class: History of  . DYSPHAGIA 03/24/2010  . Chronic ischemic heart disease 12/03/2007  . RECTAL BLEEDING 12/03/2007  . Irritable bowel syndrome with diarrhea 12/03/2007  . EPIGASTRIC PAIN 12/03/2007  . Hyperlipidemia 12/02/2007    Class: History of  . EXTERNAL HEMORRHOIDS 12/02/2007    Class: History of  . ESOPHAGITIS 12/02/2007  . GASTRITIS 12/02/2007  . COLITIS 12/02/2007  . DIVERTICULOSIS, COLON 12/02/2007  . ARTHRITIS 12/02/2007    Class: Chronic    Past Surgical History:  Procedure Laterality Date  . CARDIAC CATHETERIZATION    . CARDIAC CATHETERIZATION  01/01/15   patent stents -there is severe disease in ostial 1st diag but without change.   Marland Kitchen CARDIAC DEFIBRILLATOR PLACEMENT  02/05/2007   Guidant  . CARDIOVASCULAR STRESS TEST  12/30/2013   abnormal  . coronary stents     . CYSTOSCOPY WITH URETEROSCOPY Right 05/20/2013   Procedure: CYSTOSCOPY, RIGHT URETEROSCOPY STONE EXTRACTION, Insertion of right DOUBLE J STENT ;  Surgeon: Irine Seal, MD;  Location: [facility];  Service: Urology;  Laterality: Right;  . CYSTOSCOPY WITH URETEROSCOPY AND STENT PLACEMENT N/A 06/03/2013   Procedure: SECOND LOOK CYSTOSCOPY WITH URETEROSCOPY  HOLMIUM LASER LITHO AND STONE EXTRACTION Sammie Bench ;  Surgeon: Malka So, MD;  Location: [facility];  Service: Urology;  Laterality: N/A;  . HEMORROIDECTOMY  80's  . HOLMIUM LASER APPLICATION Right 08/24/2798   Procedure: HOLMIUM LASER APPLICATION;  Surgeon: Irine Seal, MD;  Location: [facility];  Service: Urology;  Laterality: Right;  . HYSTEROSCOPY W/D&C  09/02/2010   Procedure:  DILATATION AND CURETTAGE (D&C) /HYSTEROSCOPY;  Surgeon: Margarette Asal;  Location: [facility];  Service: Gynecology;  Laterality: N/A;  Dilation and Curettage with Hysteroscopy and Polypectomy  . IMPLANTABLE CARDIOVERTER DEFIBRILLATOR (ICD) GENERATOR CHANGE N/A 02/10/2014   Procedure: ICD GENERATOR CHANGE;  Surgeon: Sanda Klein, MD;  Location: [facility] CATH LAB;  Service: Cardiovascular;  Laterality: N/A;  . LEFT HEART CATHETERIZATION WITH CORONARY ANGIOGRAM N/A 07/28/2011   Procedure: LEFT HEART CATHETERIZATION WITH CORONARY ANGIOGRAM;  Surgeon: Lorretta Harp, MD;  [facility] C[facility];  Service: Cardiovascular;  Laterality: N/A;  . LEFT HEART CATHETERIZATION WITH CORONARY ANGIOGRAM N/A 12/31/2013   Procedure: LEFT HEART CATHETERIZATION WITH CORONARY ANGIOGRAM;  Surgeon: Wellington Hampshire, MD;  Location: [facility] CATH LAB;  Service: Cardiovascular;  Laterality: N/A;    OB History    No data available       Home Medications    Prior to Admission medications   Medication  Sig Start Date End Date Taking? Authorizing Provider  apixaban (ELIQUIS) 5 MG TABS tablet Take 1 tablet (5 mg total) by mouth 2 (two) times daily. 05/17/16   Croitoru, Mihai, MD  Calcium Citrate-Vitamin D (CITRACAL + D PO) Take 1 tablet by mouth daily.    [provider]  Cholecalciferol (VITAMIN D) 1000 UNITS capsule Take 3,000 Units by mouth daily.     [provider]  ELIQUIS 2.5 MG TABS tablet TAKE  (1)  TABLET TWICE A DAY. 02/23/16   Croitoru, Mihai, MD  Ferrous Sulfate (IRON) 325 (65 FE) MG TABS Take 325 tablets by mouth daily. 06/30/14   Irene Shipper, MD  glucose blood (ACCU-CHEK AVIVA PLUS) test strip Use to check BG once daily.  DX: E11.9 09/14/14   Chipper Herb, MD  Lancet Devices [facility]) lancets Use to check BG once daily.  Dx:  E11.9 09/14/14   Chipper Herb, MD  lisinopril (PRINIVIL,ZESTRIL) 20 MG tablet Take 1 tablet (20 mg total) by mouth 2 (two) times daily. Patient taking differently:  Take 40 mg by mouth 2 (two) times daily.  02/17/16   Croitoru, Mihai, MD  metoprolol tartrate (LOPRESSOR) 25 MG tablet TAKE (1) TABLET TWICE A DAY AT 10 A.M. & 10 P.M. 05/16/16   Croitoru, Mihai, MD  Multiple Vitamins-Minerals (CENTRUM SILVER ULTRA WOMENS PO) Take 1 tablet by mouth daily.    [provider]  nitroGLYCERIN (NITROSTAT) 0.4 MG SL tablet Place 1 tablet (0.4 mg total) under the tongue every 5 (five) minutes as needed for chest pain. 12/31/13   Isaiah Serge, NP  ondansetron (ZOFRAN ODT) 4 MG disintegrating tablet 4mg  ODT q4 hours prn nausea/vomit 04/25/16   Muthersbaugh, Jarrett Soho, PA-C  pantoprazole (PROTONIX) 40 MG tablet TKAE 1 TABLET 2 TIMES A DAY 11/22/15   Croitoru, Mihai, MD  PARoxetine (PAXIL) 20 MG tablet Take 1 tablet (20 mg total) by mouth every morning. 05/16/16   Chipper Herb, MD  rosuvastatin (CRESTOR) 20 MG tablet Take 1 tablet (20 mg total) by mouth daily. Patient taking differently: Take 10 mg by mouth daily.  11/08/15   Croitoru, Mihai, MD  torsemide (DEMADEX) 20 MG tablet Take 20 mg by mouth daily as needed (edema).    [provider]    Family History Family History  Problem Relation Age of Onset  . Stroke Mother   . Other Mother        brain tumor  . Other Father        MI  . Heart attack Father   . Stroke Sister   . Macular degeneration Sister   . Diabetes Daughter   . Cancer Daughter        ovarian  . Atrial fibrillation Sister   . Hyperlipidemia Sister   . Osteoporosis Sister   . Stroke Sister   . Uterine cancer Sister   . Heart attack Brother   . Asthma Brother   . Colon polyps Neg Hx     Social History Social History  Substance Use Topics  . Smoking status: Never Smoker  . Smokeless tobacco: Never Used  . Alcohol use No     Allergies   Actos [pioglitazone]; Adhesive [tape]; Contrast media [iodinated diagnostic agents]; Latex; and Lipitor [atorvastatin]   Review of Systems Review of Systems  Genitourinary: Positive  for flank pain.  ROS: Statement: All systems negative except as marked or noted in the HPI; Constitutional: Negative for fever and chills. ; ; Eyes: Negative  for eye pain, redness and discharge. ; ; ENMT: Negative for ear pain, hoarseness, nasal congestion, sinus pressure and sore throat. ; ; Cardiovascular: Negative for chest pain, palpitations, diaphoresis, dyspnea and peripheral edema. ; ; Respiratory: Negative for cough, wheezing and stridor. ; ; Gastrointestinal: Negative for nausea, vomiting, diarrhea, abdominal pain, blood in stool, hematemesis, jaundice and rectal bleeding. . ; ; Genitourinary: +dysuria. Negative for hematuria. ; ; Musculoskeletal: +right LBP. Negative for neck pain. Negative for swelling and trauma.; ; Skin: Negative for pruritus, rash, abrasions, blisters, bruising and skin lesion.; ; Neuro: Negative for headache, lightheadedness and neck stiffness. Negative for weakness, altered level of consciousness, altered mental status, extremity weakness, paresthesias, involuntary movement, seizure and syncope.       Physical Exam Updated Vital Signs BP (!) 192/82   Pulse 63   Temp 97.7 F (36.5 C) (Oral)   Resp 18   Ht 5\' 3"  (1.6 m)   Wt 78.9 kg (174 lb)   SpO2 97%   BMI 30.82 kg/m   Physical Exam 1220; Physical examination:  Nursing notes reviewed; Vital signs and O2 SAT reviewed;  Constitutional: Well developed, Well nourished, Well hydrated, In no acute distress; Head:  Normocephalic, atraumatic; Eyes: EOMI, PERRL, No scleral icterus; ENMT: Mouth and pharynx normal, Mucous membranes moist; Neck: Supple, Full range of motion, No lymphadenopathy; Cardiovascular: Regular rate and rhythm, No gallop; Respiratory: Breath sounds clear & equal bilaterally, No wheezes.  Speaking full sentences with ease, Normal respiratory effort/excursion; Chest: Nontender, Movement normal; Abdomen: Soft, Nontender, Nondistended, Normal bowel sounds; Genitourinary: No CVA tenderness; Spine:  No  midline CS, TS, LS tenderness. +TTP right lumbar paraspinal muscles.;; Extremities: Pulses normal, No tenderness, No edema, No calf edema or asymmetry.; Neuro: AA&Ox3, Major CN grossly intact.  Speech clear. No gross focal motor or sensory deficits in extremities.; Skin: Color normal, Warm, Dry.   ED Treatments / Results  Labs (all labs ordered are listed, but only abnormal results are displayed)   EKG  EKG Interpretation None       Radiology   Procedures Procedures (including critical care time)  Medications Ordered in ED Medications  morphine 2 MG/ML injection 2 mg (2 mg Intravenous Given 06/11/16 1235)     Initial Impression / Assessment and Plan / ED Course  I have reviewed the triage vital signs and the nursing notes.  Pertinent labs & imaging results that were available during my care of the patient were reviewed by me and considered in my medical decision making (see chart for details).  MDM Reviewed: previous chart, nursing note and vitals Reviewed previous: labs and CT scan Interpretation: CT scan and labs   Results for orders placed or performed during the hospital encounter of 06/11/16  Urinalysis, Routine w reflex microscopic- may I&O cath if menses  Result Value Ref Range   Color, Urine YELLOW YELLOW   APPearance CLEAR CLEAR   Specific Gravity, Urine 1.010 1.005 - 1.030   pH 6.0 5.0 - 8.0   Glucose, UA NEGATIVE NEGATIVE mg/dL   Hgb urine dipstick TRACE (A) NEGATIVE   Bilirubin Urine NEGATIVE NEGATIVE   Ketones, ur NEGATIVE NEGATIVE mg/dL   Protein, ur NEGATIVE NEGATIVE mg/dL   Nitrite NEGATIVE NEGATIVE   Leukocytes, UA LARGE (A) NEGATIVE  CBC  Result Value Ref Range   WBC 7.0 4.0 - 10.5 K/uL   RBC 4.40 3.87 - 5.11 MIL/uL   Hemoglobin 13.6 12.0 - 15.0 g/dL   HCT 41.8 36.0 - 46.0 %   MCV  95.0 78.0 - 100.0 fL   MCH 30.9 26.0 - 34.0 pg   MCHC 32.5 30.0 - 36.0 g/dL   RDW 12.9 11.5 - 15.5 %   Platelets 286 150 - 400 K/uL  Comprehensive metabolic  panel  Result Value Ref Range   Sodium 140 135 - 145 mmol/L   Potassium 4.3 3.5 - 5.1 mmol/L   Chloride 104 101 - 111 mmol/L   CO2 26 22 - 32 mmol/L   Glucose, Bld 191 (H) 65 - 99 mg/dL   BUN 19 6 - 20 mg/dL   Creatinine, Ser 1.03 (H) 0.44 - 1.00 mg/dL   Calcium 9.5 8.9 - 10.3 mg/dL   Total Protein 7.5 6.5 - 8.1 g/dL   Albumin 3.9 3.5 - 5.0 g/dL   AST 23 15 - 41 U/L   ALT 20 14 - 54 U/L   Alkaline Phosphatase 119 38 - 126 U/L   Total Bilirubin 0.8 0.3 - 1.2 mg/dL   GFR calc non Af Amer 49 (L) >60 mL/min   GFR calc Af Amer 57 (L) >60 mL/min   Anion gap 10 5 - 15  Differential  Result Value Ref Range   Neutrophils Relative % 64 %   Neutro Abs 4.3 1.7 - 7.7 K/uL   Lymphocytes Relative 20 %   Lymphs Abs 1.4 0.7 - 4.0 K/uL   Monocytes Relative 11 %   Monocytes Absolute 0.7 0.1 - 1.0 K/uL   Eosinophils Relative 5 %   Eosinophils Absolute 0.4 0.0 - 0.7 K/uL   Basophils Relative 0 %   Basophils Absolute 0.0 0.0 - 0.1 K/uL  Urinalysis, Microscopic (reflex)  Result Value Ref Range   RBC / HPF 0-5 0 - 5 RBC/hpf   WBC, UA 0-5 0 - 5 WBC/hpf   Bacteria, UA RARE (A) NONE SEEN   Squamous Epithelial / LPF 0-5 (A) NONE SEEN   Ct Renal Stone Study Result Date: 06/11/2016 CLINICAL DATA:  83 year old female with a history of right flank pain for 1 week. History of kidney stones. EXAM: CT ABDOMEN AND PELVIS WITHOUT CONTRAST TECHNIQUE: Multidetector CT imaging of the abdomen and pelvis was performed following the standard protocol without IV contrast. COMPARISON:  CT 04/25/2016, 11/25/2015, 04/13/2015 FINDINGS: Lower chest: No acute abnormality. Incompletely visualized cardiac pacing leads. Native coronary calcifications with evidence of prior coronary stenting. Hepatobiliary: Unremarkable appearance of liver. Unremarkable gallbladder. Pancreas: Unremarkable pancreas Spleen: Unremarkable spleen Adrenals/Urinary Tract: Unremarkable appearance bilateral adrenal glands. Asymmetry of the bilateral  kidneys again demonstrated with the right greater than left in kidney volume. Re- demonstration of regional calcific hyperdensity in the medullary region of the bilateral kidneys. On the right there is nonobstructive stone at the superior collecting system measuring 5 mm. Additional small stone in the interpolar region measures 3 mm. No hydronephrosis. Course of the right ureter unremarkable. Nonobstructive stones on the left, with the largest in the inferior collecting system measuring 6 mm. No hydronephrosis. Unremarkable course of the left ureter. Unremarkable appearance of the urinary bladder. Stomach/Bowel: Unremarkable appearance of stomach, small bowel with no abnormal distention. No transition point. No evidence of obstruction. Normal appendix. Mild changes of diverticular disease without evidence of acute diverticulitis. No significant stool burden. Vascular/Lymphatic: Scattered vascular calcifications of the abdominal aorta. No aneurysm. No lymphadenopathy. Reproductive: Unremarkable appearance of the pelvic organs. Other: No abdominal wall hernia. Leads within the left anterior abdominal wall. Musculoskeletal: Multilevel degenerative changes of the thoracolumbar spine. Compared to the prior CT of 04/25/2016, there is a  new compression fracture of the L1 level with less than 50% height loss at the superior endplate, mid vertebral body. Most advanced degenerative disc disease at the L2-L3, L3-L4, and L5-S1 levels. Degenerative changes of the hips. Similar appearance of leftward apex scoliotic curvature of the lumbar spine. IMPRESSION: Re- demonstration of bilateral nonobstructive nephrolithiasis and changes of medullary nephrocalcinosis. No evidence of ureteral stone or hydronephrosis. Current CT demonstrates a new L1 compression fracture compared to the CT 04/25/2016, age indeterminate by CT imaging. If there is concern for acute component, referral for evaluation may be useful, with consideration of  nuclear medicine bone scan/SPECT. Diverticular disease without evidence of acute diverticulitis. Aortic atherosclerosis and native coronary artery disease. Evidence of prior coronary stenting. Electronically Signed   By: Corrie Mckusick D.O.   On: 06/11/2016 13:08    1450:  T/C to Ortho Dr. Wynelle Link, case discussed, including:  HPI, pertinent PM/SHx, VS/PE, dx testing, ED course and treatment:  Avoid steroids at this time, back brace will likely not reach high enough, pt may need kyphoplasty, tx pain meds and mm relaxers, have pt call ofc Tuesday morning to schedule f/u appt this week. Dx and testing, as well as Ortho MD, d/w pt and family.  Questions answered.  Verb understanding, agreeable to d/c home with outpt f/u.    Final Clinical Impressions(s) / ED Diagnoses   Final diagnoses:  None    New Prescriptions New Prescriptions   No medications on file      Francine Graven, DO 06/14/16 1537

## 2016-06-11 NOTE — Discharge Instructions (Signed)
Take the prescriptions as directed.  Apply moist heat or ice to the area(s) of discomfort, for 15 minutes at a time, several times per day for the next few days.  Do not fall asleep on a heating or ice pack.  Call your regular Orthopedic doctor on Tuesday to schedule a follow up appointment this week.  Return to the Emergency Department immediately if worsening.

## 2016-06-11 NOTE — ED Triage Notes (Signed)
Rt flank pain x 1week, has gotten worse in the last 2 days

## 2016-06-12 LAB — URINE CULTURE

## 2016-06-13 ENCOUNTER — Other Ambulatory Visit: Payer: Self-pay | Admitting: *Deleted

## 2016-06-13 MED ORDER — CEFDINIR 300 MG PO CAPS: 300.0000 mg | ORAL_CAPSULE | Freq: Two times a day (BID) | ORAL | 0 refills | Status: DC

## 2016-06-14 LAB — URINE CULTURE: Culture: >=100000 — AB

## 2016-06-15 ENCOUNTER — Telehealth: Payer: Self-pay | Admitting: *Deleted

## 2016-06-15 NOTE — Telephone Encounter (Signed)
Post ED Visit - Positive Culture Follow-up  Culture report reviewed by antimicrobial stewardship pharmacist:  []  Elenor Quinones, Pharm.D. []  Heide Guile, Pharm.D., BCPS AQ-ID []  Parks Neptune, Pharm.D., BCPS []  Alycia Rossetti, Pharm.D., BCPS []  [place], Pharm.D., BCPS, AAHIVP []  Legrand Como, Pharm.D., BCPS, AAHIVP []  Salome Arnt, PharmD, BCPS []  Dimitri Ped, PharmD, BCPS []  Vincenza Hews, PharmD, BCPS  Positive urine culture Chart reviewed by Arlean Hopping, PA-C  and no further patient follow-up is required at this time.  Harlon Flor [facility] 06/15/2016, 2:18 PM

## 2016-06-15 NOTE — ED Provider Notes (Signed)
06/15/16 9:52 AM Spoke with the pharmacist who brought urinary culture results for my review. Culture returned greater than 100,000 Escherichia coli. Due to the fact that the patient indicated dysuria and some flank discomfort without ureterolithiasis on CT, I recommended that we initiate antibiotic therapy. Keflex 500 mg twice a day for 5 days. Close PCP/urologist follow-up.   Lorayne Bender, PA-C 06/15/16 [id number]    Milton Ferguson, MD 06/17/16 [phone]

## 2016-06-15 NOTE — Progress Notes (Signed)
ED Antimicrobial Stewardship Positive Culture Follow Up   [name] is an 83 y.o. female who presented to [facility] on 06/11/2016 with a chief complaint of  Chief Complaint  Patient presents with  . Flank Pain    Recent Results (from the past 720 hour(s))  Culture, Urine     Status: Abnormal   Collection Time: 05/26/16 11:00 AM  Result Value Ref Range Status   Specimen Description URINE, CLEAN CATCH  Final   Special Requests NONE  Final   Culture MULTIPLE SPECIES PRESENT, SUGGEST RECOLLECTION (A)  Final   Report Status 05/28/2016 FINAL  Final  Culture, Urine     Status: None   Collection Time: 06/02/16 11:00 AM  Result Value Ref Range Status   Specimen Description URINE, CLEAN CATCH  Final   Special Requests NONE  Final   Culture   Final    NO GROWTH Performed at [facility], [place] [address] [place], [place] [address]    Report Status 06/03/2016 FINAL  Final  Microscopic Examination     Status: Abnormal   Collection Time: 06/09/16  9:52 AM  Result Value Ref Range Status   WBC, UA >30 (A) 0 - 5 /hpf Final   RBC, UA 0-2 0 - 2 /hpf Final   Epithelial Cells (non renal) 0-10 0 - 10 /hpf Final   Renal Epithel, UA 0-10 (A) None seen /hpf Final   Bacteria, UA Moderate (A) None seen/Few Final  Urine culture     Status: Abnormal   Collection Time: 06/09/16 11:09 AM  Result Value Ref Range Status   Urine Culture, Routine Final report (A)  Final   Urine Culture result 1 Escherichia coli (A)  Final    Comment: Greater than 100,000 colony forming units per mL Cefazolin <=4 ug/mL Cefazolin with an MIC <=16 predicts susceptibility to the oral agents cefaclor, cefdinir, cefpodoxime, cefprozil, cefuroxime, cephalexin, and loracarbef when used for therapy of uncomplicated urinary tract infections due to E. coli, Klebsiella pneumoniae, and Proteus mirabilis.    RESULT 2 Comment  Final    Comment: Mixed urogenital flora 10,000-25,000 colony forming units per mL    ANTIMICROBIAL SUSCEPTIBILITY Comment  Final    Comment:       ** S = Susceptible; I = Intermediate; R = Resistant **                    P = Positive; N = Negative             MICS are expressed in micrograms per mL    Antibiotic                 RSLT#1    RSLT#2    RSLT#3    RSLT#4 Amoxicillin/Clavulanic Acid    S Ampicillin                     S Cefepime                       S Ceftriaxone                    S Cefuroxime                     S Cephalothin                    S Ciprofloxacin  S Ertapenem                      S Gentamicin                     S Imipenem                       S Levofloxacin                   S Nitrofurantoin                 S Piperacillin                   S Tetracycline                   S Tobramycin                     S Trimethoprim/Sulfa             S   Urine culture     Status: Abnormal   Collection Time: 06/11/16 12:20 PM  Result Value Ref Range Status   Specimen Description URINE, RANDOM  Final   Special Requests NONE  Final   Culture >=100,000 COLONIES/mL ESCHERICHIA COLI (A)  Final   Report Status 06/14/2016 FINAL  Final   Organism ID, Bacteria ESCHERICHIA COLI (A)  Final      Susceptibility   Escherichia coli - MIC*    AMPICILLIN 8 SENSITIVE Sensitive     CEFAZOLIN <=4 SENSITIVE Sensitive     CEFTRIAXONE <=1 SENSITIVE Sensitive     CIPROFLOXACIN <=0.25 SENSITIVE Sensitive     GENTAMICIN <=1 SENSITIVE Sensitive     IMIPENEM <=0.25 SENSITIVE Sensitive     NITROFURANTOIN <=16 SENSITIVE Sensitive     TRIMETH/SULFA <=20 SENSITIVE Sensitive     AMPICILLIN/SULBACTAM <=2 SENSITIVE Sensitive     PIP/TAZO <=4 SENSITIVE Sensitive     Extended ESBL NEGATIVE Sensitive     * >=100,000 COLONIES/mL ESCHERICHIA COLI   [x]  Patient discharged originally without antimicrobial agent and treatment is now indicated. Due to patient's symptoms of dysuria and right flank pain, the PA would like to treat her for cystitis. The PA also noted that  the stone was still in the patient's kidney and would not likely be causing her right flank pain.   New antibiotic prescription: Keflex 500mg  BID x 5 days  ED Provider: Arlean Hopping, PA-C   Geraldo Pitter 06/15/2016, 9:53 AM Student Pharmacist

## 2016-06-19 ENCOUNTER — Ambulatory Visit: Payer: Medicare Other | Admitting: Family Medicine

## 2016-06-20 ENCOUNTER — Encounter: Payer: Self-pay | Admitting: Cardiovascular Disease

## 2016-06-20 ENCOUNTER — Telehealth: Payer: Self-pay | Admitting: *Deleted

## 2016-06-20 DIAGNOSIS — S32010A Wedge compression fracture of first lumbar vertebra, initial encounter for closed fracture: Secondary | ICD-10-CM | POA: Diagnosis not present

## 2016-06-20 NOTE — Telephone Encounter (Signed)
Patient walked into the office today to deliver a surgical clearance for L7 compression fracture kyphoplasty. Clearance will need to be faxed to [facility], attn sherry willis @ [phone]. Will forward for dr croitoru's review and advise

## 2016-06-20 NOTE — Telephone Encounter (Signed)
epicd it.

## 2016-06-20 NOTE — Telephone Encounter (Signed)
Melina Schools MD

## 2016-06-20 NOTE — Telephone Encounter (Signed)
She will need to hold the Eliquis for at least 48 hours before the procedure. If you give me a physician name, I can do the letter in [place]

## 2016-06-28 DIAGNOSIS — H353222 Exudative age-related macular degeneration, left eye, with inactive choroidal neovascularization: Secondary | ICD-10-CM | POA: Diagnosis not present

## 2016-06-28 DIAGNOSIS — H353212 Exudative age-related macular degeneration, right eye, with inactive choroidal neovascularization: Secondary | ICD-10-CM | POA: Diagnosis not present

## 2016-06-28 DIAGNOSIS — H04123 Dry eye syndrome of bilateral lacrimal glands: Secondary | ICD-10-CM | POA: Diagnosis not present

## 2016-06-28 DIAGNOSIS — H353122 Nonexudative age-related macular degeneration, left eye, intermediate dry stage: Secondary | ICD-10-CM | POA: Diagnosis not present

## 2016-07-04 ENCOUNTER — Ambulatory Visit: Payer: Medicare Other | Admitting: Family Medicine

## 2016-07-04 DIAGNOSIS — S32010D Wedge compression fracture of first lumbar vertebra, subsequent encounter for fracture with routine healing: Secondary | ICD-10-CM | POA: Diagnosis not present

## 2016-07-04 DIAGNOSIS — M48061 Spinal stenosis, lumbar region without neurogenic claudication: Secondary | ICD-10-CM | POA: Diagnosis not present

## 2016-07-04 DIAGNOSIS — M419 Scoliosis, unspecified: Secondary | ICD-10-CM | POA: Diagnosis not present

## 2016-07-10 ENCOUNTER — Other Ambulatory Visit: Payer: Self-pay | Admitting: Family Medicine

## 2016-07-10 ENCOUNTER — Other Ambulatory Visit: Payer: Medicare Other

## 2016-07-10 DIAGNOSIS — R300 Dysuria: Secondary | ICD-10-CM | POA: Diagnosis not present

## 2016-07-10 DIAGNOSIS — R350 Frequency of micturition: Secondary | ICD-10-CM

## 2016-07-10 DIAGNOSIS — Z1211 Encounter for screening for malignant neoplasm of colon: Secondary | ICD-10-CM

## 2016-07-10 DIAGNOSIS — Z87440 Personal history of urinary (tract) infections: Secondary | ICD-10-CM

## 2016-07-10 DIAGNOSIS — E119 Type 2 diabetes mellitus without complications: Secondary | ICD-10-CM

## 2016-07-10 LAB — URINALYSIS, COMPLETE
Bilirubin, UA: NEGATIVE
Glucose, UA: NEGATIVE
KETONES UA: NEGATIVE
NITRITE UA: NEGATIVE
PH UA: 6 (ref 5.0–7.5)
Protein, UA: NEGATIVE
RBC UA: NEGATIVE
Urobilinogen, Ur: 0.2 mg/dL (ref 0.2–1.0)

## 2016-07-10 LAB — MICROSCOPIC EXAMINATION: Renal Epithel, UA: NONE SEEN /hpf

## 2016-07-10 NOTE — Addendum Note (Signed)
Addended by: Liliane Bade on: 07/10/2016 05:44 PM   Modules accepted: Orders

## 2016-07-13 ENCOUNTER — Ambulatory Visit (INDEPENDENT_AMBULATORY_CARE_PROVIDER_SITE_OTHER): Payer: Medicare Other | Admitting: Family Medicine

## 2016-07-13 ENCOUNTER — Encounter: Payer: Self-pay | Admitting: Family Medicine

## 2016-07-13 VITALS — BP 148/76 | HR 57 | Temp 98.4°F | Ht 63.0 in | Wt 168.0 lb

## 2016-07-13 DIAGNOSIS — I259 Chronic ischemic heart disease, unspecified: Secondary | ICD-10-CM | POA: Diagnosis not present

## 2016-07-13 DIAGNOSIS — N390 Urinary tract infection, site not specified: Secondary | ICD-10-CM

## 2016-07-13 DIAGNOSIS — E559 Vitamin D deficiency, unspecified: Secondary | ICD-10-CM | POA: Diagnosis not present

## 2016-07-13 DIAGNOSIS — I480 Paroxysmal atrial fibrillation: Secondary | ICD-10-CM | POA: Diagnosis not present

## 2016-07-13 DIAGNOSIS — E0843 Diabetes mellitus due to underlying condition with diabetic autonomic (poly)neuropathy: Secondary | ICD-10-CM | POA: Diagnosis not present

## 2016-07-13 DIAGNOSIS — E119 Type 2 diabetes mellitus without complications: Secondary | ICD-10-CM

## 2016-07-13 DIAGNOSIS — M8008XA Age-related osteoporosis with current pathological fracture, vertebra(e), initial encounter for fracture: Secondary | ICD-10-CM

## 2016-07-13 DIAGNOSIS — R002 Palpitations: Secondary | ICD-10-CM | POA: Diagnosis not present

## 2016-07-13 DIAGNOSIS — M8088XA Other osteoporosis with current pathological fracture, vertebra(e), initial encounter for fracture: Secondary | ICD-10-CM | POA: Diagnosis not present

## 2016-07-13 DIAGNOSIS — I10 Essential (primary) hypertension: Secondary | ICD-10-CM | POA: Diagnosis not present

## 2016-07-13 DIAGNOSIS — E7800 Pure hypercholesterolemia, unspecified: Secondary | ICD-10-CM | POA: Diagnosis not present

## 2016-07-13 LAB — BAYER DCA HB A1C WAIVED: HB A1C (BAYER DCA - WAIVED): 7.6 % — ABNORMAL HIGH (ref <7.0)

## 2016-07-13 LAB — FECAL OCCULT BLOOD, IMMUNOCHEMICAL: Fecal Occult Bld: POSITIVE — AB

## 2016-07-13 MED ORDER — AMOXICILLIN-POT CLAVULANATE 875-125 MG PO TABS
1.0000 | ORAL_TABLET | Freq: Two times a day (BID) | ORAL | 0 refills | Status: DC
Start: 2016-07-13 — End: 2016-08-19

## 2016-07-13 NOTE — Progress Notes (Signed)
Subjective:    Patient ID: [name], female    DOB: November 27, 1931, 83 y.o.   MRN: [id number]  HPI Pt here for follow up and management of chronic medical problems which includes hyperlipidemia, hypertension, and diabetes. She is taking medication regularly.The patient also has had a recent urinalysis that was positive for infection and the culture was also positive. This patient is also being seen by the urologist and the cardiologist. A recent urine culture and sensitivity that was reported today showed her to have Escherichia coli greater than 100,000 colony-forming units that was sensitive to Augmentin. This prescription was called in for her today and she will be expected to recheck a urinalysis that is clean catch midstream when the antibiotic is completed. She will have lab work done today and she did return her FOBT today. The patient is seeing several different doctors. She sees Dr. Rolena Infante, the orthopedic surgeon and he is considering doing a vertebral plasty because of a spinal compression fracture. She sees the cardiologist Dr. Recardo Evangelist every 6 months. She sees Dr. Jeffie Pollock, the urologist every 3 months. She did complain last night of feeling some lightheadedness and palpitations and this happened again this morning. She denies any shortness of breath. She has had some dark stools black as a matter of fact along with her irritable bowel syndrome symptoms. The stools are loose in nature. She is not sure when she saw the gastroenterologist last but we need to make her an appointment for follow-up. She is also having some hurting with voiding. She sees the urologist again soon and we will go ahead and treat the urinary tract infection which is growing Escherichia coli with some Augmentin twice daily with food. Once again we will arrange for her to see the gastroenterologist because of the dark stools. The recent urinary tract infection information was reviewed with the patient she will take a copy of  this with her when she goes back to see the urologist.   Patient Active Problem List   Diagnosis Date Noted  . Long term current use of anticoagulant 02/17/2016  . Near syncope 05/22/2015  . Diabetes mellitus type 2, diet-controlled ([place]) 09/14/2014  . ICD (implantable cardioverter-defibrillator) battery depletion 01/20/2014  . Abnormal nuclear stress test, 12/30/13 12/31/2013  . CAD in native artery 12/31/2013  . Unstable angina, neg MI, cath stable.maybe GI 12/30/2013  . Paroxysmal atrial fibrillation, chads2 Vasc2 score of 5, on eliquis 10/16/2013  . Catecholaminergic polymorphic ventricular tachycardia ([place]) 10/16/2013  . Ureteral stone with hydronephrosis 05/20/2013  . Metabolic syndrome 63/33/5456  . Hypotension, meds stopped this admission 07/29/2011  . Orthostatic hypotension 07/27/2011  . Chest pain, r/o cardiac, negative MI 07/25/2011  . Chronic sinus bradycardia 07/25/2011  . Automatic implantable cardioverter-defibrillator in situ 07/25/2011  . CKD (chronic kidney disease) stage 3, GFR 30-59 ml/min 07/25/2011    Class: Chronic  . History of DVT in the past, on eliquis now 07/25/2011    Class: History of  . Nephrolithiasis, just saw Dr [name] 07/25/2011    Class: History of  . DYSPHAGIA 03/24/2010  . Chronic ischemic heart disease 12/03/2007  . RECTAL BLEEDING 12/03/2007  . Irritable bowel syndrome with diarrhea 12/03/2007  . EPIGASTRIC PAIN 12/03/2007  . Hyperlipidemia 12/02/2007    Class: History of  . EXTERNAL HEMORRHOIDS 12/02/2007    Class: History of  . ESOPHAGITIS 12/02/2007  . GASTRITIS 12/02/2007  . COLITIS 12/02/2007  . DIVERTICULOSIS, COLON 12/02/2007  . ARTHRITIS 12/02/2007    Class:  Chronic   Outpatient Encounter Prescriptions as of 07/13/2016  Medication Sig  . apixaban (ELIQUIS) 5 MG TABS tablet Take 1 tablet (5 mg total) by mouth 2 (two) times daily.  . Calcium Citrate-Vitamin D (CITRACAL + D PO) Take 1 tablet by mouth daily.  .  Cholecalciferol (VITAMIN D) 1000 UNITS capsule Take 2,000 Units by mouth daily.   Marland Kitchen gabapentin (NEURONTIN) 100 MG capsule Take 100 mg by mouth 3 (three) times daily.  Marland Kitchen glucose blood (ACCU-CHEK AVIVA PLUS) test strip Use to check BG once daily.  DX: E11.9  . Lancet Devices (ACCU-CHEK SOFTCLIX) lancets Use to check BG once daily.  Dx:  E11.9  . lisinopril (PRINIVIL,ZESTRIL) 20 MG tablet Take 1 tablet (20 mg total) by mouth 2 (two) times daily. (Patient taking differently: Take 40 mg by mouth 2 (two) times daily. )  . methocarbamol (ROBAXIN) 500 MG tablet Take 1 tablet (500 mg total) by mouth 2 (two) times daily.  . metoprolol tartrate (LOPRESSOR) 25 MG tablet TAKE (1) TABLET TWICE A DAY AT 10 A.M. & 10 P.M.  . Multiple Vitamins-Minerals (CENTRUM SILVER ULTRA WOMENS PO) Take 1 tablet by mouth daily.  . nitroGLYCERIN (NITROSTAT) 0.4 MG SL tablet Place 1 tablet (0.4 mg total) under the tongue every 5 (five) minutes as needed for chest pain.  Marland Kitchen ondansetron (ZOFRAN ODT) 4 MG disintegrating tablet 4m ODT q4 hours prn nausea/vomit  . oxyCODONE-acetaminophen (PERCOCET/ROXICET) 5-325 MG tablet 1 or 2 tabs PO q12h prn pain  . pantoprazole (PROTONIX) 40 MG tablet TKAE 1 TABLET 2 TIMES A DAY  . PARoxetine (PAXIL) 20 MG tablet Take 1 tablet (20 mg total) by mouth every morning.  . rosuvastatin (CRESTOR) 20 MG tablet Take 1 tablet (20 mg total) by mouth daily. (Patient taking differently: Take 10 mg by mouth daily. )  . torsemide (DEMADEX) 20 MG tablet Take 20 mg by mouth daily as needed (edema).  . [DISCONTINUED] disopyramide (NORPACE CR) 100 MG 12 hr capsule Take 100 mg by mouth every 12 (twelve) hours.  . [DISCONTINUED] cefdinir (OMNICEF) 300 MG capsule Take 1 capsule (300 mg total) by mouth 2 (two) times daily. 1 po BID  . [DISCONTINUED] ELIQUIS 2.5 MG TABS tablet TAKE  (1)  TABLET TWICE A DAY. (Patient not taking: Reported on 06/11/2016)   No facility-administered encounter medications on file as of  07/13/2016.       Review of Systems  Constitutional: Negative.   HENT: Negative.   Eyes: Negative.   Respiratory: Negative.   Cardiovascular: Negative.   Gastrointestinal: Negative.   Endocrine: Negative.   Genitourinary: Negative.   Musculoskeletal: Positive for back pain (following Dr D. Brooks).  Skin: Negative.   Allergic/Immunologic: Negative.   Neurological: Negative.   Hematological: Negative.   Psychiatric/Behavioral: Negative.        Objective:   Physical Exam  Constitutional: She is oriented to person, place, and time. She appears well-developed and well-nourished. No distress.  The patient is pleasant and alert with several additional complaints  HENT:  Head: Normocephalic and atraumatic.  Right Ear: External ear normal.  Left Ear: External ear normal.  Nose: Nose normal.  Mouth/Throat: Oropharynx is clear and moist.  Eyes: Conjunctivae and EOM are normal. Pupils are equal, round, and reactive to light. Right eye exhibits no discharge. Left eye exhibits no discharge. No scleral icterus.  Neck: Normal range of motion. Neck supple. No thyromegaly present.  No bruits thyromegaly or anterior cervical adenopathy  Cardiovascular: Normal rate, regular rhythm, normal  heart sounds and intact distal pulses.   No murmur heard. Heart has a regular rate and rhythm at 60/m  Pulmonary/Chest: Effort normal and breath sounds normal. No respiratory distress. She has no wheezes. She has no rales.  Clear anteriorly and posteriorly  Abdominal: Soft. Bowel sounds are normal. She exhibits no mass. There is tenderness. There is no rebound and no guarding.  Generalized abdominal tenderness with no liver or spleen enlargement  Musculoskeletal: Normal range of motion. She exhibits tenderness. She exhibits no edema.  Range of motion is hesitant due to recent vertebral fracture  Lymphadenopathy:    She has no cervical adenopathy.  Neurological: She is alert and oriented to person, place,  and time. She has normal reflexes. No cranial nerve deficit.  Skin: Skin is warm and dry. No rash noted.  Psychiatric: She has a normal mood and affect. Her behavior is normal. Judgment and thought content normal.  Nursing note and vitals reviewed.   BP (!) 160/85 (BP Location: Left Arm)   Pulse (!) 57   Temp 98.4 F (36.9 C) (Oral)   Ht 5' 3"  (1.6 m)   Wt 168 lb (76.2 kg)   BMI 29.76 kg/m   Repeat blood pressure in the left arm sitting was 146/78 with a large cuff.     Assessment & Plan:  1. Essential hypertension -The blood pressure today after checking the second time was almost back to a normal range and no further changes will be made at this time. She will continue with current treatment. - BMP8+EGFR - CBC with Differential/Platelet - Hepatic function panel  2. Hyperlipidemia -Continue with current treatment pending results of lab work - CBC with Differential/Platelet - Lipid panel  3. Diabetes mellitus type 2, diet-controlled ([place]) -Continue with current treatment and aggressive therapeutic lifestyle changes pending results of lab work - CBC with Differential/Platelet - Bayer DCA Hb A1c Waived - Microalbumin / creatinine urine ratio  4. Vitamin D deficiency -Continue with vitamin D replacement pending results of lab work - CBC with Differential/Platelet - VITAMIN D 25 Hydroxy (Vit-D Deficiency, Fractures)  5. Paroxysmal atrial fibrillation, chads2 Vasc2 score of 5, on eliquis -Continue to follow-up with cardiology - CBC with Differential/Platelet  6. Fracture of vertebra due to osteoporosis, initial encounter [facility]) -Follow-up with Dr. Rolena Infante for vertebral plasty as planned pending okay from cardiology  7. Diabetes mellitus due to underlying condition with diabetic autonomic neuropathy, without long-term current use of insulin (H[facility]) -Continue good blood sugar control and checking feet regularly  8. Palpitations -On auscultation the heart was regular  today.  9. Urinary tract infection without hematuria, site unspecified -Take Augmentin 875 twice daily with food and recheck urinalysis when completed. Please take a copy of the urine culture and sensitivity report to the next visit with the urologist  10. Dark stools -The patient continues to have irritable bowel syndrome symptoms as well as some recent dark stools. An FOBT is being checked today she will be scheduled for an appointment with the gastroenterologist in the group of the one that she used to see that has retired.  Patient Instructions                       Medicare Annual Wellness Visit  [facility] and the medical providers at [facility] strive to bring you the best medical care.  In doing so we not only want to address your current medical conditions and concerns but also to detect  new conditions early and prevent illness, disease and health-related problems.    Medicare offers a yearly Wellness Visit which allows our clinical staff to assess your need for preventative services including immunizations, lifestyle education, counseling to decrease risk of preventable diseases and screening for fall risk and other medical concerns.    This visit is provided free of charge (no copay) for all Medicare recipients. The clinical pharmacists at [facility] have begun to conduct these Wellness Visits which will also include a thorough review of all your medications.    As you primary medical provider recommend that you make an appointment for your Annual Wellness Visit if you have not done so already this year.  You may set up this appointment before you leave today or you may call back ([phone]) and schedule an appointment.  Please make sure when you call that you mention that you are scheduling your Annual Wellness Visit with the clinical pharmacist so that the appointment may be made for the proper length of time.     Continue current  medications. Continue good therapeutic lifestyle changes which include good diet and exercise. Fall precautions discussed with patient. If an FOBT was given today- please return it to our front desk. If you are over 44 years old - you may need Prevnar 26 or the adult Pneumonia vaccine.  **Flu shots are available--- please call and schedule a FLU-CLINIC appointment**  After your visit with [place] today you will receive a survey in the mail or online from [employer] regarding your care with [place]. Please take a moment to fill this out. Your feedback is very important to [place] as you can help [place] better understand your patient needs as well as improve your experience and satisfaction. WE CARE ABOUT YOU!!!     Arrie Senate MD

## 2016-07-13 NOTE — Patient Instructions (Signed)
Medicare Annual Wellness Visit  [place] and the medical providers at [facility] strive to bring you the best medical care.  In doing so we not only want to address your current medical conditions and concerns but also to detect new conditions early and prevent illness, disease and health-related problems.    Medicare offers a yearly Wellness Visit which allows our clinical staff to assess your need for preventative services including immunizations, lifestyle education, counseling to decrease risk of preventable diseases and screening for fall risk and other medical concerns.    This visit is provided free of charge (no copay) for all Medicare recipients. The clinical pharmacists at [facility] have begun to conduct these Wellness Visits which will also include a thorough review of all your medications.    As you primary medical provider recommend that you make an appointment for your Annual Wellness Visit if you have not done so already this year.  You may set up this appointment before you leave today or you may call back ([phone]) and schedule an appointment.  Please make sure when you call that you mention that you are scheduling your Annual Wellness Visit with the clinical pharmacist so that the appointment may be made for the proper length of time.     Continue current medications. Continue good therapeutic lifestyle changes which include good diet and exercise. Fall precautions discussed with patient. If an FOBT was given today- please return it to our front desk. If you are over 50 years old - you may need Prevnar 13 or the adult Pneumonia vaccine.  **Flu shots are available--- please call and schedule a FLU-CLINIC appointment**  After your visit with us today you will receive a survey in the mail or online from [employer] regarding your care with us. Please take a moment to fill this out. Your feedback is very  important to us as you can help us better understand your patient needs as well as improve your experience and satisfaction. WE CARE ABOUT YOU!!!    

## 2016-07-14 ENCOUNTER — Other Ambulatory Visit: Payer: Self-pay | Admitting: *Deleted

## 2016-07-14 DIAGNOSIS — K921 Melena: Secondary | ICD-10-CM

## 2016-07-14 DIAGNOSIS — R195 Other fecal abnormalities: Secondary | ICD-10-CM

## 2016-07-14 LAB — CBC WITH DIFFERENTIAL/PLATELET
Basophils Absolute: 0 10*3/uL (ref 0.0–0.2)
Basos: 1 %
EOS (ABSOLUTE): 0.3 10*3/uL (ref 0.0–0.4)
EOS: 6 %
HEMATOCRIT: 37.2 % (ref 34.0–46.6)
Hemoglobin: 12.1 g/dL (ref 11.1–15.9)
IMMATURE GRANS (ABS): 0 10*3/uL (ref 0.0–0.1)
IMMATURE GRANULOCYTES: 0 %
LYMPHS ABS: 1.4 10*3/uL (ref 0.7–3.1)
Lymphs: 26 %
MCH: 30.9 pg (ref 26.6–33.0)
MCHC: 32.5 g/dL (ref 31.5–35.7)
MCV: 95 fL (ref 79–97)
Monocytes Absolute: 0.6 10*3/uL (ref 0.1–0.9)
Monocytes: 11 %
Neutrophils Absolute: 3 10*3/uL (ref 1.4–7.0)
Neutrophils: 56 %
PLATELETS: 247 10*3/uL (ref 150–379)
RBC: 3.91 x10E6/uL (ref 3.77–5.28)
RDW: 14 % (ref 12.3–15.4)
WBC: 5.4 10*3/uL (ref 3.4–10.8)

## 2016-07-14 LAB — HEPATIC FUNCTION PANEL
ALBUMIN: 4 g/dL (ref 3.5–4.7)
ALT: 16 IU/L (ref 0–32)
AST: 18 IU/L (ref 0–40)
Alkaline Phosphatase: 93 IU/L (ref 39–117)
BILIRUBIN TOTAL: 0.7 mg/dL (ref 0.0–1.2)
Bilirubin, Direct: 0.18 mg/dL (ref 0.00–0.40)
Total Protein: 6.5 g/dL (ref 6.0–8.5)

## 2016-07-14 LAB — BMP8+EGFR
BUN / CREAT RATIO: 15 (ref 12–28)
BUN: 19 mg/dL (ref 8–27)
CO2: 22 mmol/L (ref 20–29)
CREATININE: 1.28 mg/dL — AB (ref 0.57–1.00)
Calcium: 9.5 mg/dL (ref 8.7–10.3)
Chloride: 100 mmol/L (ref 96–106)
GFR calc Af Amer: 45 mL/min/{1.73_m2} — ABNORMAL LOW (ref >59)
GFR calc non Af Amer: 39 mL/min/{1.73_m2} — ABNORMAL LOW (ref >59)
GLUCOSE: 161 mg/dL — AB (ref 65–99)
Potassium: 5 mmol/L (ref 3.5–5.2)
Sodium: 138 mmol/L (ref 134–144)

## 2016-07-14 LAB — URINE CULTURE

## 2016-07-14 LAB — LIPID PANEL
CHOL/HDL RATIO: 2.5 ratio (ref 0.0–4.4)
Cholesterol, Total: 152 mg/dL (ref 100–199)
HDL: 60 mg/dL (ref >39)
LDL CALC: 73 mg/dL (ref 0–99)
TRIGLYCERIDES: 97 mg/dL (ref 0–149)
VLDL Cholesterol Cal: 19 mg/dL (ref 5–40)

## 2016-07-14 LAB — VITAMIN D 25 HYDROXY (VIT D DEFICIENCY, FRACTURES): Vit D, 25-Hydroxy: 35.6 ng/mL (ref 30.0–100.0)

## 2016-07-17 ENCOUNTER — Encounter: Payer: Self-pay | Admitting: Internal Medicine

## 2016-07-17 ENCOUNTER — Ambulatory Visit (INDEPENDENT_AMBULATORY_CARE_PROVIDER_SITE_OTHER): Payer: Medicare Other | Admitting: Internal Medicine

## 2016-07-17 ENCOUNTER — Telehealth: Payer: Self-pay | Admitting: Family Medicine

## 2016-07-17 VITALS — BP 118/76 | HR 74 | Ht 63.0 in | Wt 168.0 lb

## 2016-07-17 DIAGNOSIS — I259 Chronic ischemic heart disease, unspecified: Secondary | ICD-10-CM | POA: Diagnosis not present

## 2016-07-17 DIAGNOSIS — K219 Gastro-esophageal reflux disease without esophagitis: Secondary | ICD-10-CM

## 2016-07-17 DIAGNOSIS — R195 Other fecal abnormalities: Secondary | ICD-10-CM | POA: Diagnosis not present

## 2016-07-17 NOTE — Progress Notes (Addendum)
HISTORY OF PRESENT ILLNESS:  [name] is a 83 y.o. female with MULTIPLE SIGNIFICANT medical problems including hypertension, coronary artery disease with prior coronary artery stent placement, history of cardiac arrest status post ICD placement, recurrent DVT on chronic anticoagulation therapy in the form of L at quest, arthritis, GERD, complicated by peptic stricture requiring esophageal dilation, and chronic diarrhea secondary to microscopic colitis. Previous patient Dr. Verl Blalock. Sent here today for urgent GI consultation by Dr. Morrie Sheldon regarding patient reports of dark stools and Hemoccult positive stool on recent testing. Patient was last seen in this office 06/30/2014 regarding dark stools and Hemoccult positive stool. Vague abdominal discomfort. Virtual colonoscopy was negative. Stool at day was Hemoccult negative. Her hemoglobin was stable. Review of outside records shows hemoglobin 4 days ago was 12.1. Unchanged from 3 months previous. Last colonoscopy every 2012. No polyps. Otherwise normal. No endoscopic colitis. Last upper endoscopy with esophageal dilation 2012. Large hiatal hernia and esophagitis noted. Gastritis. She does stay on PPI. She tells me that her last bowel movement was yesterday. She tells me this was dark. She continues with diarrhea intermittently. The treatment for microscopic colitis currently.  REVIEW OF SYSTEMS:  All non-GI ROS negative except for anxiety, sinus allergy, back pain, blood in urine, visual change, confusion, fatigue, headaches, itching, skin rash, ankle swelling, excessive thirst, excessive urination, urinary frequency, urinary leakage, shortness of breath  Past Medical History:  Diagnosis Date  . AICD (automatic cardioverter/defibrillator) present   . Anxiety   . ARTHRITIS   . Arthritis   . CAD (coronary artery disease)    a. history of cardiac arrest 1993;  b. s/p LAD/LCX stenting in 2003;  c. 07/2011 Cath: LM nl, LAD patent stent, D1  80ost, LCX patent stent, RCA min irregs;  d. 04/2012 MV: EF 66%, no ishcemia;  e. 12/2013 Echo: Ef 55%, no rwma, Gr 1 DD, triv AI/MR, mildly dil LA;  f. 12/2013 Lexi MV: intermediate risk - apical ischemia and inf/infsept fixed defect, ? artifactual.  . CAD in native artery 12/31/2013   Previous stents to LAD and Ramus patent on cath today 12/31/13 also There is severe disease in the ostial first diagonal which is unchanged from most recent cardiac catheterization. The right coronary artery could not be engaged selectively but nonselective angiography showed no significant disease in the proximal and midsegment.     . Cataract   . Chronic back pain   . Chronic sinus bradycardia   . CKD (chronic kidney disease), stage III   . Diabetes mellitus without complication ([place])   . Diverticulosis of colon (without mention of hemorrhage) 2007   Colonoscopy   . Esophageal stricture    a. 2012 s/p dil.  . Esophagitis, unspecified    a. 2012 EGD  . EXTERNAL HEMORRHOIDS   . GERD (gastroesophageal reflux disease)    omeprazole  . H/O hiatal hernia   . Hiatal hernia    a. 2012 EGD.  Marland Kitchen History of DVT in the past, not on Coumadin now    left  leg  . HYPERCHOLESTEROLEMIA   . Hypertension   . ICD (implantable cardiac defibrillator) in place    a. s/p initial ICD in 1993 in setting of cardiac arrest;  b. 01/2007 gen change: Guidant T135 Vitality DS VR single lead ICD.  . Macular degeneration    gets injecton in eye every 5 weeks- last injection - 05/03/2013   . Myocardial infarction ([place]) 1993  . Nephrolithiasis, just saw Dr Jeffie Pollock- "  OK"   . Orthostatic hypotension   . Peripheral vascular disease ([place])    ???  . Unspecified gastritis and gastroduodenitis without mention of hemorrhage    a. 2003 EGD->not noted on 2012 EGD.    Past Surgical History:  Procedure Laterality Date  . CARDIAC CATHETERIZATION    . CARDIAC CATHETERIZATION  01/01/15   patent stents -there is severe disease in ostial 1st diag  but without change.   Marland Kitchen CARDIAC DEFIBRILLATOR PLACEMENT  02/05/2007   Guidant  . CARDIOVASCULAR STRESS TEST  12/30/2013   abnormal  . coronary stents     . CYSTOSCOPY WITH URETEROSCOPY Right 05/20/2013   Procedure: CYSTOSCOPY, RIGHT URETEROSCOPY STONE EXTRACTION, Insertion of right DOUBLE J STENT ;  Surgeon: Irine Seal, MD;  Location: [facility];  Service: Urology;  Laterality: Right;  . CYSTOSCOPY WITH URETEROSCOPY AND STENT PLACEMENT N/A 06/03/2013   Procedure: SECOND LOOK CYSTOSCOPY WITH URETEROSCOPY  HOLMIUM LASER LITHO AND STONE EXTRACTION Sammie Bench ;  Surgeon: Malka So, MD;  Location: [facility];  Service: Urology;  Laterality: N/A;  . HEMORROIDECTOMY  80's  . HOLMIUM LASER APPLICATION Right 09/21/6211   Procedure: HOLMIUM LASER APPLICATION;  Surgeon: Irine Seal, MD;  Location: [facility];  Service: Urology;  Laterality: Right;  . HYSTEROSCOPY W/D&C  09/02/2010   Procedure: DILATATION AND CURETTAGE (D&C) /HYSTEROSCOPY;  Surgeon: Margarette Asal;  Location: [facility];  Service: Gynecology;  Laterality: N/A;  Dilation and Curettage with Hysteroscopy and Polypectomy  . IMPLANTABLE CARDIOVERTER DEFIBRILLATOR (ICD) GENERATOR CHANGE N/A 02/10/2014   Procedure: ICD GENERATOR CHANGE;  Surgeon: Sanda Klein, MD;  Location: [facility];  Service: Cardiovascular;  Laterality: N/A;  . LEFT HEART CATHETERIZATION WITH CORONARY ANGIOGRAM N/A 07/28/2011   Procedure: LEFT HEART CATHETERIZATION WITH CORONARY ANGIOGRAM;  Surgeon: Lorretta Harp, MD;  Location: [facility];  Service: Cardiovascular;  Laterality: N/A;  . LEFT HEART CATHETERIZATION WITH CORONARY ANGIOGRAM N/A 12/31/2013   Procedure: LEFT HEART CATHETERIZATION WITH CORONARY ANGIOGRAM;  Surgeon: Wellington Hampshire, MD;  Location: [facility] CATH [facility];  Service: Cardiovascular;  Laterality: N/A;    Social History SCHAE CANDO  reports that she has never smoked. She has never used smokeless tobacco. She reports that she does not drink alcohol or use drugs.  family  history includes Asthma in her brother; Atrial fibrillation in her sister; Cancer in her daughter; Diabetes in her daughter; Heart attack in her brother and father; Hyperlipidemia in her sister; Macular degeneration in her sister; Osteoporosis in her sister; Other in her father and mother; Stroke in her mother, sister, and sister; Uterine cancer in her sister.  Allergies  Allergen Reactions  . Actos [Pioglitazone] Swelling  . Adhesive [Tape] Other (See Comments)    Causes sores  . Contrast Media [Iodinated Diagnostic Agents] Other (See Comments)    Headache (no action/pre-med required)  . Latex Rash  . Lipitor [Atorvastatin] Other (See Comments)    myalgia       PHYSICAL EXAMINATION: Vital signs: BP 118/76   Pulse 74   Ht 5\' 3"  (1.6 m)   Wt 168 lb (76.2 kg)   BMI 29.76 kg/m   Constitutional: Elderly female who is generally well-appearing, no acute distress Psychiatric: alert and oriented x3, cooperative Eyes: extraocular movements intact, anicteric, conjunctiva pink Mouth: oral pharynx moist, no lesions Neck: supple no lymphadenopathy Cardiovascular: heart regular rate and rhythm, no murmur Lungs: clear to auscultation bilaterally Abdomen: soft, nontender, nondistended, no obvious ascites, no peritoneal signs, normal bowel sounds,  no organomegaly Rectal: [name] stool Extremities: no clubbing cyanosis or lower extremity edema bilaterally Skin: no lesions on visible extremities Neuro: No focal deficits. Cranial nerves intact No asterixis.    ASSESSMENT:  #1. Reports of dark stools. Question melena. Brown today #2. Heme positive stool #3. Multiple medical problems on Eliquis #4. Stable hemoglobin  #5. History of GERD and peptic stricture on PPI #6. History of microscopic colitis #7. Last colonoscopy 2012 negative for neoplasia. Negative virtual colonoscopy 2 years ago  PLAN:  #1. Continue PPI #2. Schedule upper endoscopy. Patient is high risk given her age and  comorbidities. The nature of the procedure, as well as the risks, benefits, and alternatives were carefully and thoroughly reviewed with the patient. Ample time for discussion and questions allowed. The patient understood, was satisfied, and agreed to proceed. #3. Stay on Anticoagulation therapy for the diagnostic exam

## 2016-07-17 NOTE — Patient Instructions (Signed)

## 2016-07-17 NOTE — Telephone Encounter (Signed)
Aware. 

## 2016-07-18 ENCOUNTER — Encounter: Payer: Self-pay | Admitting: Internal Medicine

## 2016-07-18 ENCOUNTER — Ambulatory Visit (AMBULATORY_SURGERY_CENTER): Payer: Medicare Other | Admitting: Internal Medicine

## 2016-07-18 VITALS — BP 158/70 | HR 64 | Temp 96.8°F | Resp 16 | Ht 63.0 in | Wt 168.0 lb

## 2016-07-18 DIAGNOSIS — K921 Melena: Secondary | ICD-10-CM | POA: Diagnosis not present

## 2016-07-18 DIAGNOSIS — R195 Other fecal abnormalities: Secondary | ICD-10-CM

## 2016-07-18 MED ORDER — SODIUM CHLORIDE 0.9 % IV SOLN: 500.0000 mL | INTRAVENOUS | Status: DC

## 2016-07-18 NOTE — Progress Notes (Signed)
A/ox3, pleased with MAC, report to [facility]

## 2016-07-18 NOTE — Op Note (Signed)
[facility] [place] Patient Name: [name] Procedure Date: 07/18/2016 11:59 AM MRN: [id number] Endoscopist: Docia Chuck. Henrene Pastor , MD Age: 83 Referring MD:  Date of Birth: 1931/11/16 Gender: Female Account #: [id number] Procedure:                Upper GI endoscopy Indications:              Heme positive stool, reports of dark stools. [name]                            yesterday Medicines:                Monitored Anesthesia Care Procedure:                Pre-Anesthesia Assessment:                           - Prior to the procedure, a History and Physical                            was performed, and patient medications and                            allergies were reviewed. The patient's tolerance of                            previous anesthesia was also reviewed. The risks                            and benefits of the procedure and the sedation                            options and risks were discussed with the patient.                            All questions were answered, and informed consent                            was obtained. Prior Anticoagulants: The patient has                            taken Eliquis (apixaban), last dose was day of                            procedure. ASA Grade Assessment: III - A patient                            with severe systemic disease. After reviewing the                            risks and benefits, the patient was deemed in                            satisfactory condition to undergo the procedure.  After obtaining informed consent, the endoscope was                            passed under direct vision. Throughout the                            procedure, the patient's blood pressure, pulse, and                            oxygen saturations were monitored continuously. The                            Model [id number]) scope was introduced                            through the mouth, and advanced to the  second part                            of duodenum. The upper GI endoscopy was                            accomplished without difficulty. The patient                            tolerated the procedure well. Scope In: Scope Out: Findings:                 The esophagus was normal.                           The stomach was normal.                           The examined duodenum was normal.                           The cardia and gastric fundus were normal on                            retroflexion. Complications:            No immediate complications. Estimated Blood Loss:     Estimated blood loss: none. Impression:               - Normal esophagus.                           - Normal stomach.                           - Normal examined duodenum.                           - No specimens collected. Recommendation:           - Patient has a contact number available for  emergencies. The signs and symptoms of potential                            delayed complications were discussed with the                            patient. Return to normal activities tomorrow.                            Written discharge instructions were provided to the                            patient.                           - Resume previous diet.                           - Continue present medications. Docia Chuck. Henrene Pastor, MD 07/18/2016 12:12:47 PM This report has been signed electronically.

## 2016-07-18 NOTE — Patient Instructions (Signed)
YOU HAD AN ENDOSCOPIC PROCEDURE TODAY: Refer to the procedure report and other information in the discharge instructions given to you for any specific questions about what was found during the examination. If this information does not answer your questions, please call [place] office at [phone] to clarify.   YOU SHOULD EXPECT: Some feelings of bloating in the abdomen. Passage of more gas than usual. Walking can help get rid of the air that was put into your GI tract during the procedure and reduce the bloating. If you had a lower endoscopy (such as a colonoscopy or flexible sigmoidoscopy) you may notice spotting of blood in your stool or on the toilet paper. Some abdominal soreness may be present for a day or two, also.  DIET: Your first meal following the procedure should be a light meal and then it is ok to progress to your normal diet. A half-sandwich or bowl of soup is an example of a good first meal. Heavy or fried foods are harder to digest and may make you feel nauseous or bloated. Drink plenty of fluids but you should avoid alcoholic beverages for 24 hours. If you had a esophageal dilation, please see attached instructions for diet.    ACTIVITY: Your care partner should take you home directly after the procedure. You should plan to take it easy, moving slowly for the rest of the day. You can resume normal activity the day after the procedure however YOU SHOULD NOT DRIVE, use power tools, machinery or perform tasks that involve climbing or major physical exertion for 24 hours (because of the sedation medicines used during the test).   SYMPTOMS TO REPORT IMMEDIATELY: A gastroenterologist can be reached at any hour. Please call [phone]  for any of the following symptoms:   Following upper endoscopy (EGD, EUS, ERCP, esophageal dilation) Vomiting of blood or coffee ground material  New, significant abdominal pain  New, significant chest pain or pain under the shoulder blades  Painful or  persistently difficult swallowing  New shortness of breath  Black, tarry-looking or red, bloody stools  FOLLOW UP:  If any biopsies were taken you will be contacted by phone or by letter within the next 1-3 weeks. Call [phone]  if you have not heard about the biopsies in 3 weeks.  Please also call with any specific questions about appointments or follow up tests.  

## 2016-07-20 ENCOUNTER — Telehealth: Payer: Self-pay | Admitting: *Deleted

## 2016-07-20 NOTE — Telephone Encounter (Signed)
  Follow up Call-  Call back number 07/18/2016  Post procedure Call Back phone  # [phone]  Permission to leave phone message Yes  Some recent data might be hidden     Patient questions:  Do you have a fever, pain , or abdominal swelling? No. Pain Score  0 *  Have you tolerated food without any problems? Yes.    Have you been able to return to your normal activities? Yes.    Do you have any questions about your discharge instructions: Diet   No. Medications  No. Follow up visit  No.  Do you have questions or concerns about your Care? No.  Actions: * If pain score is 4 or above: No action needed, pain <4.  Pt. Stated she had some indigestion,instructed pt. To follow up with doctor if the indigestion continues,she verbalize understanding.

## 2016-07-24 ENCOUNTER — Telehealth: Payer: Self-pay | Admitting: Family Medicine

## 2016-07-24 NOTE — Telephone Encounter (Signed)
Pt aware UA cup & FOBT left at front desk to be picked up

## 2016-07-26 ENCOUNTER — Ambulatory Visit (INDEPENDENT_AMBULATORY_CARE_PROVIDER_SITE_OTHER): Payer: Medicare Other | Admitting: *Deleted

## 2016-07-26 DIAGNOSIS — I429 Cardiomyopathy, unspecified: Secondary | ICD-10-CM | POA: Diagnosis not present

## 2016-07-26 NOTE — Progress Notes (Signed)
Remote ICD transmission.   

## 2016-07-27 ENCOUNTER — Other Ambulatory Visit: Payer: Medicare Other

## 2016-07-27 DIAGNOSIS — A499 Bacterial infection, unspecified: Secondary | ICD-10-CM | POA: Diagnosis not present

## 2016-07-27 DIAGNOSIS — Z1212 Encounter for screening for malignant neoplasm of rectum: Secondary | ICD-10-CM | POA: Diagnosis not present

## 2016-07-27 DIAGNOSIS — Z1211 Encounter for screening for malignant neoplasm of colon: Secondary | ICD-10-CM | POA: Diagnosis not present

## 2016-07-27 DIAGNOSIS — N390 Urinary tract infection, site not specified: Principal | ICD-10-CM

## 2016-07-27 LAB — CUP PACEART REMOTE DEVICE CHECK
Brady Statistic RV Percent Paced: 1 %
HIGH POWER IMPEDANCE MEASURED VALUE: 46 Ohm
Implantable Lead Implant Date: 20090120
Implantable Lead Model: 157
Implantable Lead Serial Number: 138237
Lead Channel Impedance Value: 757 Ohm
Lead Channel Pacing Threshold Amplitude: 0.7 V
Lead Channel Pacing Threshold Pulse Width: 0.5 ms
Lead Channel Setting Pacing Pulse Width: 0.5 ms
MDC IDC LEAD LOCATION: 753860
MDC IDC MSMT BATTERY REMAINING LONGEVITY: 144 mo
MDC IDC MSMT BATTERY REMAINING PERCENTAGE: 100 %
MDC IDC PG IMPLANT DT: 20160126
MDC IDC SESS DTM: 20180711044100
MDC IDC SET LEADCHNL RV PACING AMPLITUDE: 2.2 V
MDC IDC SET LEADCHNL RV SENSING SENSITIVITY: 0.6 mV
Pulse Gen Serial Number: 191956

## 2016-07-27 LAB — MICROSCOPIC EXAMINATION: Renal Epithel, UA: NONE SEEN /hpf

## 2016-07-27 LAB — URINALYSIS, ROUTINE W REFLEX MICROSCOPIC
Bilirubin, UA: NEGATIVE
Glucose, UA: NEGATIVE
KETONES UA: NEGATIVE
Nitrite, UA: NEGATIVE
PROTEIN UA: NEGATIVE
SPEC GRAV UA: 1.01 (ref 1.005–1.030)
Urobilinogen, Ur: 0.2 mg/dL (ref 0.2–1.0)
pH, UA: 5.5 (ref 5.0–7.5)

## 2016-07-29 LAB — FECAL OCCULT BLOOD, IMMUNOCHEMICAL: FECAL OCCULT BLD: POSITIVE — AB

## 2016-08-01 ENCOUNTER — Encounter: Payer: Self-pay | Admitting: Cardiology

## 2016-08-03 ENCOUNTER — Other Ambulatory Visit: Payer: Self-pay | Admitting: *Deleted

## 2016-08-03 LAB — URINE CULTURE

## 2016-08-03 MED ORDER — SULFAMETHOXAZOLE-TRIMETHOPRIM 800-160 MG PO TABS: 1.0000 | ORAL_TABLET | Freq: Two times a day (BID) | ORAL | 0 refills | Status: DC

## 2016-08-04 ENCOUNTER — Telehealth: Payer: Self-pay | Admitting: Family Medicine

## 2016-08-04 NOTE — Telephone Encounter (Signed)
Pt has appt scheduled with Dr Jeffie Pollock and Dr Henrene Pastor Pt will call back if sxs worsen or persist

## 2016-08-09 ENCOUNTER — Telehealth: Payer: Self-pay | Admitting: Family Medicine

## 2016-08-09 ENCOUNTER — Telehealth: Payer: Self-pay | Admitting: Internal Medicine

## 2016-08-09 NOTE — Telephone Encounter (Signed)
Pt called  - med was discussed  -  Referrals were discussed - she has appt with Jeffie Pollock 09/01/16 at 10:15 in [place]  And a note was sent to DR Henrene Pastor to ask about follow up

## 2016-08-09 NOTE — Telephone Encounter (Signed)
Dr. Laurance Flatten asked that you review chart.  Patient had another heme positive stool

## 2016-08-10 ENCOUNTER — Encounter: Payer: Self-pay | Admitting: *Deleted

## 2016-08-10 NOTE — Telephone Encounter (Signed)
Roselyn Reef notified of the recommendations.

## 2016-08-10 NOTE — Telephone Encounter (Signed)
Stop checking Hemoccults on this patient. She had recent EGD which was negative and colonoscopy up-to-date. No anemia. On chronic blood thinner. Does not need further GI evaluation she has significant overt GI bleeding. Thank you

## 2016-08-10 NOTE — Progress Notes (Signed)
I spoke with nurse at [facility] office about pt having a second FOBT positive. He states she has had a full GI work up and no need to further follow up at this time. He asked that we stop doing the FOBT on this pt.

## 2016-08-10 NOTE — Progress Notes (Signed)
Patient aware of recommendations.  

## 2016-08-10 NOTE — Progress Notes (Signed)
No more FOBT's per gastroenterologist. Please let patient know this

## 2016-08-11 ENCOUNTER — Other Ambulatory Visit: Payer: Medicare Other

## 2016-08-11 DIAGNOSIS — Z87440 Personal history of urinary (tract) infections: Secondary | ICD-10-CM | POA: Diagnosis not present

## 2016-08-11 DIAGNOSIS — H40033 Anatomical narrow angle, bilateral: Secondary | ICD-10-CM | POA: Diagnosis not present

## 2016-08-11 DIAGNOSIS — N390 Urinary tract infection, site not specified: Secondary | ICD-10-CM | POA: Diagnosis not present

## 2016-08-11 DIAGNOSIS — H2513 Age-related nuclear cataract, bilateral: Secondary | ICD-10-CM | POA: Diagnosis not present

## 2016-08-12 LAB — URINE CULTURE

## 2016-08-14 ENCOUNTER — Other Ambulatory Visit: Payer: Self-pay | Admitting: Cardiovascular Disease

## 2016-08-14 ENCOUNTER — Other Ambulatory Visit: Payer: Self-pay | Admitting: Family Medicine

## 2016-08-15 ENCOUNTER — Encounter: Payer: Self-pay | Admitting: Cardiology

## 2016-08-19 ENCOUNTER — Ambulatory Visit (INDEPENDENT_AMBULATORY_CARE_PROVIDER_SITE_OTHER): Payer: Medicare Other | Admitting: Pediatrics

## 2016-08-19 VITALS — BP 156/81 | HR 60 | Temp 97.5°F | Ht 63.0 in | Wt 170.8 lb

## 2016-08-19 DIAGNOSIS — L309 Dermatitis, unspecified: Secondary | ICD-10-CM | POA: Diagnosis not present

## 2016-08-19 DIAGNOSIS — I259 Chronic ischemic heart disease, unspecified: Secondary | ICD-10-CM | POA: Diagnosis not present

## 2016-08-19 DIAGNOSIS — I10 Essential (primary) hypertension: Secondary | ICD-10-CM

## 2016-08-19 DIAGNOSIS — L03116 Cellulitis of left lower limb: Secondary | ICD-10-CM | POA: Diagnosis not present

## 2016-08-19 MED ORDER — TRIAMCINOLONE ACETONIDE 0.025 % EX OINT: 1.0000 "application " | TOPICAL_OINTMENT | Freq: Two times a day (BID) | CUTANEOUS | 0 refills | Status: DC

## 2016-08-19 MED ORDER — CEPHALEXIN 500 MG PO CAPS: 500.0000 mg | ORAL_CAPSULE | Freq: Three times a day (TID) | ORAL | 0 refills | Status: DC

## 2016-08-19 NOTE — Progress Notes (Signed)
  Subjective:   Patient ID: [name], female    DOB: 04/22/1931, 83 y.o.   MRN: [id number] CC: Leg Pain (left);  and Rash  HPI: [name] is a 83 y.o. female presenting for Leg Pain (left);  and Rash  Has seen small amount of blood on toilet paper when she wipes Has large stools sometimes and it hurts Has loose stools several times a day usually but not for the past couple of weeks Still with dark colored stools, Hg has been stable Last 2 weeks has been constipated since on abx for uti Has been drinking fluids regularly  Has had burning and stinging, pain in L lower leg for past 2 days Has varicose veins b/l Some redness L lower leg No swelling On eliquis   Itchy rash lower back she thinks started when she was on bactrim recently for UTI Has improved some, still itching  No fevers Appetite has been fine No CP or SOB  Relevant past medical, surgical, family and social history reviewed. Allergies and medications reviewed and updated. History  Smoking Status  . Never Smoker  Smokeless Tobacco  . Never Used   ROS: Per HPI   Objective:    BP (!) 156/81   Pulse 60   Temp (!) 97.5 F (36.4 C) (Oral)   Ht 5\' 3"  (1.6 m)   Wt 170 lb 12.8 oz (77.5 kg)   BMI 30.26 kg/m   Wt Readings from Last 3 Encounters:  08/19/16 170 lb 12.8 oz (77.5 kg)  07/18/16 168 lb (76.2 kg)  07/17/16 168 lb (76.2 kg)    Gen: NAD, alert, cooperative with exam, NCAT EYES: EOMI, no conjunctival injection, or no icterus CV: NRRR, normal S1/S2 Resp: CTABL, no wheezes, normal WOB Abd: +BS, soft, NTND.  Ext: No edema, warm Neuro: Alert and oriented, strength equal b/l UE and LE, coordination grossly normal Skin: varicose veins present b/l lower legs, soft L lower leg above ankle with some redness, ttp, some warmth, no induration or fluctuance or swelling Lower back with excoriations  Assessment & Plan:  Ashlley was seen today for leg pain, dizziness and rash.  Diagnoses and all  orders for this visit:  Cellulitis of left lower extremity Start below Any worsening of symptoms including fever, pain, rtc -     cephALEXin (KEFLEX) 500 MG capsule; Take 1 capsule (500 mg total) by mouth 3 (three) times daily.  Dermatitis Started with bactrim per pt, added to allergy list -     triamcinolone (KENALOG) 0.025 % ointment; Apply 1 application topically 2 (two) times daily. On lower back rash  Essential hypertension Improved with recheck Took meds right before coming to clinic Cont to follow at home rtc 2 weeks for recheck  Follow up plan: Return in about 2 weeks (around 09/02/2016) for med follow up. Assunta Found, MD [place]

## 2016-08-19 NOTE — Patient Instructions (Addendum)
Use triamcinolone for rash on back, or other itchy areas on body. Do not use on face, can cause thinning of skin. Can use twice a day. If not improving over next week let us know  Start antibiotic for skin infection on leg  Take stool softeners, miralax as needed for soft daily bowel movement. I do not want you to strain to pass stool.

## 2016-08-21 ENCOUNTER — Telehealth: Payer: Self-pay | Admitting: Family Medicine

## 2016-08-25 MED ORDER — GABAPENTIN 100 MG PO CAPS: 100.0000 mg | ORAL_CAPSULE | Freq: Every day | ORAL | 0 refills | Status: DC

## 2016-08-25 NOTE — Telephone Encounter (Signed)
Sent to [employer]

## 2016-08-30 ENCOUNTER — Ambulatory Visit (INDEPENDENT_AMBULATORY_CARE_PROVIDER_SITE_OTHER): Payer: Medicare Other | Admitting: Pediatrics

## 2016-08-30 ENCOUNTER — Encounter: Payer: Self-pay | Admitting: Pediatrics

## 2016-08-30 ENCOUNTER — Telehealth: Payer: Self-pay | Admitting: Family Medicine

## 2016-08-30 ENCOUNTER — Telehealth: Payer: Self-pay | Admitting: Cardiovascular Disease

## 2016-08-30 VITALS — BP 138/88 | HR 77 | Temp 97.6°F | Ht 63.0 in | Wt 172.2 lb

## 2016-08-30 DIAGNOSIS — B373 Candidiasis of vulva and vagina: Secondary | ICD-10-CM | POA: Diagnosis not present

## 2016-08-30 DIAGNOSIS — H43812 Vitreous degeneration, left eye: Secondary | ICD-10-CM | POA: Diagnosis not present

## 2016-08-30 DIAGNOSIS — I4891 Unspecified atrial fibrillation: Secondary | ICD-10-CM

## 2016-08-30 DIAGNOSIS — H353212 Exudative age-related macular degeneration, right eye, with inactive choroidal neovascularization: Secondary | ICD-10-CM | POA: Diagnosis not present

## 2016-08-30 DIAGNOSIS — H353221 Exudative age-related macular degeneration, left eye, with active choroidal neovascularization: Secondary | ICD-10-CM | POA: Diagnosis not present

## 2016-08-30 DIAGNOSIS — H353124 Nonexudative age-related macular degeneration, left eye, advanced atrophic with subfoveal involvement: Secondary | ICD-10-CM | POA: Diagnosis not present

## 2016-08-30 DIAGNOSIS — I259 Chronic ischemic heart disease, unspecified: Secondary | ICD-10-CM

## 2016-08-30 DIAGNOSIS — H3562 Retinal hemorrhage, left eye: Secondary | ICD-10-CM | POA: Diagnosis not present

## 2016-08-30 DIAGNOSIS — R002 Palpitations: Secondary | ICD-10-CM | POA: Diagnosis not present

## 2016-08-30 DIAGNOSIS — I5032 Chronic diastolic (congestive) heart failure: Secondary | ICD-10-CM

## 2016-08-30 DIAGNOSIS — B3731 Acute candidiasis of vulva and vagina: Secondary | ICD-10-CM

## 2016-08-30 MED ORDER — FLUCONAZOLE 150 MG PO TABS: 150.0000 mg | ORAL_TABLET | Freq: Once | ORAL | 0 refills | Status: AC

## 2016-08-30 NOTE — Telephone Encounter (Signed)
Apt made with Evette Doffing today at 2:15.

## 2016-08-30 NOTE — Addendum Note (Signed)
Addended by: Eustaquio Maize on: 08/30/2016 03:43 PM   Modules accepted: Orders

## 2016-08-30 NOTE — Telephone Encounter (Signed)
I would offer her a cardioversion since she is symptomatic despite good rate control. Please ask her if she has been compliant with her Eliquis or if there have been any recent interruptions in it.  Can we also please ask her to do an extra pacemaker download so that we can confirm that this is indeed persistent atrial fibrillation for a week and not just a coincidence? [name]

## 2016-08-30 NOTE — Telephone Encounter (Signed)
Spoke with Dr Evette Doffing regarding patient  Patient in office today c/o being lightheaded and dizzy times 1 week. EKG done and patient back in AFib. Dr Evette Doffing did increase Metoprolol from 25 mg twice a day to 50 mg twice a day, heart rate in office 70's-90's She is taking her Eliquis. No follow up until appointment until October but Dr Evette Doffing wanted to make sure Dr Sallyanne Kuster aware and did not want patient seen before then.  Will forward to Dr Sallyanne Kuster for review

## 2016-08-30 NOTE — Progress Notes (Addendum)
  Subjective:   Patient ID: [name], female    DOB: 04/27/1931, 83 y.o.   MRN: [id number] CC: Dizziness and Nausea  HPI: [name] is a 83 y.o. female presenting for Dizziness and Nausea  Dizziness has been going on for last 4 days Energy levels are down Feels nauseated at times Feels like her heart is jumping and skipping beats, doesn't usually have this feeling  Follows with Dr Sallyanne Kuster, not recently been in afib  Having some loose stools, sometimes burns when passes stool now Having stools about 3-4 times a day, yellow, no blood in stools, has seen small amount of blood on toilet paper a couple of times, not every time Finished abx for cellulitis several days ago Stools not any different since then  BPs at home 140/80s, unchanged from baseline  Appetite has been ok  Had eyes dilated this morning about 11:15am Has macular degeneration L eye, was bleeding today, got a shot R eye with cataract,   Relevant past medical, surgical, family and social history reviewed. Allergies and medications reviewed and updated. History  Smoking Status  . Never Smoker  Smokeless Tobacco  . Never Used   ROS: Per HPI   Objective:    BP 138/88   Pulse 77   Temp 97.6 F (36.4 C) (Oral)   Ht 5' 3" (1.6 m)   Wt 172 lb 3.2 oz (78.1 kg)   BMI 30.50 kg/m   Wt Readings from Last 3 Encounters:  08/30/16 172 lb 3.2 oz (78.1 kg)  08/19/16 170 lb 12.8 oz (77.5 kg)  07/18/16 168 lb (76.2 kg)    Gen: NAD, alert, cooperative with exam, NCAT EYES: EOMI, no conjunctival injection, or no icterus ENT:  TMs pearly gray b/l, OP without erythema LYMPH: no cervical LAD CV: normal rate, irregular rhythm, normal S1/S2, no murmur, distal pulses 2+ b/l Resp: CTABL, no wheezes, normal WOB Abd: +BS, soft, NTND.  Ext: No edema, warm Neuro: Alert and oriented Skin: L LE redness improved, no tenderness with palpation  Assessment & Plan:  [name] was seen today for dizziness and  nausea.  Diagnoses and all orders for this visit:  Vaginal yeast infection Symptoms since stopping abx -     fluconazole (DIFLUCAN) 150 MG tablet; Take 1 tablet (150 mg total) by mouth once.  Heart palpitations Will check labs -     CMP14+EGFR -     CBC with Differential/Platelet -     EKG 12-Lead -     Magnesium  Atrial fibrillation, unspecified type (HCC) HR 70s-upper 90s EKG in afib On eliquis Given heart palpitations will have pt take metoprolol 85m BID, was on metop 280mBID until can get in touch with her cardiologist  Follow up plan: Return in about 4 weeks (around 09/27/2016). CaAssunta FoundMD [place]

## 2016-08-31 ENCOUNTER — Telehealth: Payer: Self-pay | Admitting: Cardiovascular Disease

## 2016-08-31 LAB — MAGNESIUM: MAGNESIUM: 2.2 mg/dL (ref 1.6–2.3)

## 2016-08-31 LAB — CBC WITH DIFFERENTIAL/PLATELET
BASOS ABS: 0.1 10*3/uL (ref 0.0–0.2)
Basos: 1 %
EOS (ABSOLUTE): 0.4 10*3/uL (ref 0.0–0.4)
EOS: 6 %
Hematocrit: 33 % — ABNORMAL LOW (ref 34.0–46.6)
Hemoglobin: 10.3 g/dL — ABNORMAL LOW (ref 11.1–15.9)
IMMATURE GRANS (ABS): 0 10*3/uL (ref 0.0–0.1)
Immature Granulocytes: 0 %
LYMPHS: 30 %
Lymphocytes Absolute: 2 10*3/uL (ref 0.7–3.1)
MCH: 28.3 pg (ref 26.6–33.0)
MCHC: 31.2 g/dL — ABNORMAL LOW (ref 31.5–35.7)
MCV: 91 fL (ref 79–97)
MONOCYTES: 12 %
Monocytes Absolute: 0.8 10*3/uL (ref 0.1–0.9)
NEUTROS ABS: 3.6 10*3/uL (ref 1.4–7.0)
Neutrophils: 51 %
PLATELETS: 333 10*3/uL (ref 150–379)
RBC: 3.64 x10E6/uL — AB (ref 3.77–5.28)
RDW: 14.4 % (ref 12.3–15.4)
WBC: 6.9 10*3/uL (ref 3.4–10.8)

## 2016-08-31 LAB — CMP14+EGFR
ALBUMIN: 4 g/dL (ref 3.5–4.7)
ALT: 14 IU/L (ref 0–32)
AST: 19 IU/L (ref 0–40)
Albumin/Globulin Ratio: 1.4 (ref 1.2–2.2)
Alkaline Phosphatase: 91 IU/L (ref 39–117)
BUN / CREAT RATIO: 19 (ref 12–28)
BUN: 24 mg/dL (ref 8–27)
Bilirubin Total: 0.3 mg/dL (ref 0.0–1.2)
CALCIUM: 9.5 mg/dL (ref 8.7–10.3)
CO2: 23 mmol/L (ref 20–29)
CREATININE: 1.26 mg/dL — AB (ref 0.57–1.00)
Chloride: 100 mmol/L (ref 96–106)
GFR, EST AFRICAN AMERICAN: 46 mL/min/{1.73_m2} — AB (ref >59)
GFR, EST NON AFRICAN AMERICAN: 40 mL/min/{1.73_m2} — AB (ref >59)
GLUCOSE: 158 mg/dL — AB (ref 65–99)
Globulin, Total: 2.9 g/dL (ref 1.5–4.5)
Potassium: 5 mmol/L (ref 3.5–5.2)
Sodium: 136 mmol/L (ref 134–144)
TOTAL PROTEIN: 6.9 g/dL (ref 6.0–8.5)

## 2016-08-31 NOTE — Telephone Encounter (Signed)
New message   Pt states she is possibly in AFIB and was told to see Dr. Loletha Grayer as soon as possible. I let her know his next available and she insisted on seeing him sooner. I offered appointments with 2 APP's and she declined. She would like to see Dr. Loletha Grayer. She would like a nurse to call her back.

## 2016-08-31 NOTE — Telephone Encounter (Signed)
Patient being seen in clinic tomorrow

## 2016-08-31 NOTE — Telephone Encounter (Signed)
Returned call to patient. She states she saw her PCP yesterday - had an EKG and this demonstrated AF and a call was made to our office about this and med changes were made. Dr. Loletha Grayer recommended remote device transmission but this had not yet been communicated to patient. By the time I contacted patient, MD has an 11:20am opening for 8/17. Patient is scheduled to see Dr. Loletha Grayer for evaluation of her AF - possible cardioversion per his 8/15 note.   She reports her Hgb has dropped, has passed some blood in her stool She also reports dizziness when standing

## 2016-09-01 ENCOUNTER — Ambulatory Visit (INDEPENDENT_AMBULATORY_CARE_PROVIDER_SITE_OTHER): Payer: Medicare Other | Admitting: Cardiovascular Disease

## 2016-09-01 ENCOUNTER — Encounter: Payer: Self-pay | Admitting: Cardiovascular Disease

## 2016-09-01 ENCOUNTER — Ambulatory Visit: Payer: Medicare Other | Admitting: Urology

## 2016-09-01 VITALS — BP 150/90 | HR 74 | Ht 63.0 in | Wt 173.0 lb

## 2016-09-01 DIAGNOSIS — N183 Chronic kidney disease, stage 3 unspecified: Secondary | ICD-10-CM

## 2016-09-01 DIAGNOSIS — I4729 Other ventricular tachycardia: Secondary | ICD-10-CM

## 2016-09-01 DIAGNOSIS — R5383 Other fatigue: Secondary | ICD-10-CM

## 2016-09-01 DIAGNOSIS — Z7901 Long term (current) use of anticoagulants: Secondary | ICD-10-CM | POA: Diagnosis not present

## 2016-09-01 DIAGNOSIS — I481 Persistent atrial fibrillation: Secondary | ICD-10-CM | POA: Diagnosis not present

## 2016-09-01 DIAGNOSIS — I951 Orthostatic hypotension: Secondary | ICD-10-CM | POA: Diagnosis not present

## 2016-09-01 DIAGNOSIS — I259 Chronic ischemic heart disease, unspecified: Secondary | ICD-10-CM

## 2016-09-01 DIAGNOSIS — R001 Bradycardia, unspecified: Secondary | ICD-10-CM

## 2016-09-01 DIAGNOSIS — Z95810 Presence of automatic (implantable) cardiac defibrillator: Secondary | ICD-10-CM | POA: Diagnosis not present

## 2016-09-01 DIAGNOSIS — I472 Ventricular tachycardia: Secondary | ICD-10-CM

## 2016-09-01 DIAGNOSIS — E7800 Pure hypercholesterolemia, unspecified: Secondary | ICD-10-CM | POA: Diagnosis not present

## 2016-09-01 DIAGNOSIS — I2510 Atherosclerotic heart disease of native coronary artery without angina pectoris: Secondary | ICD-10-CM

## 2016-09-01 DIAGNOSIS — E119 Type 2 diabetes mellitus without complications: Secondary | ICD-10-CM

## 2016-09-01 DIAGNOSIS — T50905A Adverse effect of unspecified drugs, medicaments and biological substances, initial encounter: Secondary | ICD-10-CM | POA: Diagnosis not present

## 2016-09-01 DIAGNOSIS — I4819 Other persistent atrial fibrillation: Secondary | ICD-10-CM

## 2016-09-01 NOTE — Progress Notes (Signed)
. Patient ID: [name], female   DOB: 03-26-1931, 83 y.o.   MRN: [id number]    Cardiology Office Note    Date:  09/01/2016   ID:  [name], DOB 25-Oct-1931, MRN [id number]  PCP:  Chipper Herb, MD  Cardiologist:   Sanda Klein, MD   Chief Complaint  Patient presents with  . Follow-up  . Headache  . Shortness of Breath    History of Present Illness:  [name] is a 83 y.o. 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  She has felt poorly for about a week now. She feels very dizzy even though her blood pressure is normal. She had atrial fibrillation with rapid ventricular response and her maternal dose was increased. She now has good rate control with heart rate in the 70s but still feels weak and dizzy. She denies dyspnea at rest or angina pectoris. She has not experienced syncope.  She has recently developed anemia and her hemoglobin was roughly 10 (previous baseline 12). Previous workup with upper endoscopy a few weeks ago showed no abnormalities. She has not had a colonoscopy in 5 years, but her gastroenterologist, Dr. Henrene Pastor stated he would not perform 1. She has not had overt hematochezia, but she did have diarrhea with dark stools for a couple of weeks. None in the last week. She is on anticoagulation with Eliquis and has not interrupted the medication. She has been on a variety of antibiotics for last 3 months for urinary tract infections.  He was  on treatment with disopyramide for years for her history of catecholaminergic polymorphic VT, but this medication had to be gradually discontinued due to side effects, especially orthostatic hypotension and dry mouth.  Interrogation of her defibrillator shows no evidence of ventricular arrhythmia, since device implant in January 2016 Her estimated generator longevity is 12 years. She requires <1% ventricular pacing.    Past Medical History:  Diagnosis Date  . AICD (automatic cardioverter/defibrillator) present   . Allergy    SESONAL  . Anxiety   . ARTHRITIS   . Arthritis   . CAD (coronary artery disease)    a. history of cardiac arrest 1993;  b. s/p LAD/LCX stenting in 2003;  c. 07/2011 Cath: LM nl, LAD patent stent, D1 80ost, LCX patent stent, RCA min irregs;  d. 04/2012 MV: EF 66%, no ishcemia;  e. 12/2013 Echo: Ef 55%, no rwma, Gr 1 DD, triv AI/MR, mildly dil LA;  f. 12/2013 Lexi MV: intermediate risk - apical ischemia and inf/infsept fixed defect, ? artifactual.  . CAD in native artery 12/31/2013   Previous stents to LAD and Ramus patent on cath today 12/31/13 also There is severe disease in the ostial first diagonal which is unchanged from most recent cardiac catheterization. The right coronary artery could not be engaged selectively but nonselective angiography showed no significant disease in the proximal and midsegment.     . Cataract    DENIES  . Chronic back pain   . Chronic sinus bradycardia   . CKD (chronic kidney disease), stage III   . Clotting disorder (  [place])    DVT  . Diabetes mellitus without complication ([place])    DENIES  . Diverticulosis of colon (without mention of hemorrhage) 2007   Colonoscopy   . Esophageal stricture    a. 2012 s/p dil.  . Esophagitis, unspecified    a. 2012 EGD  . EXTERNAL HEMORRHOIDS   . GERD (gastroesophageal reflux disease)    omeprazole  . H/O hiatal hernia   . Hiatal hernia    a. 2012 EGD.  Marland Kitchen History of DVT in the past, not on Coumadin  now    left  leg  . HYPERCHOLESTEROLEMIA   . Hypertension   . ICD (implantable cardiac defibrillator) in place    a. s/p initial ICD in 1993 in setting of cardiac arrest;  b. 01/2007 gen change: Guidant T135 Vitality DS VR single lead ICD.  . Macular degeneration    gets injecton in eye every 5 weeks- last injection - 05/03/2013   . Myocardial infarction ([place]) 1993  . Nephrolithiasis, just saw Dr [name]   . Orthostatic hypotension   . Peripheral vascular disease ([place])    ???  . Unspecified gastritis and gastroduodenitis without mention of hemorrhage    a. 2003 EGD->not noted on 2012 EGD.    Past Surgical History:  Procedure Laterality Date  . CARDIAC CATHETERIZATION    . CARDIAC CATHETERIZATION  01/01/15   patent stents -there is severe disease in ostial 1st diag but without change.   Marland Kitchen CARDIAC DEFIBRILLATOR PLACEMENT  02/05/2007   [employer]  . CARDIOVASCULAR STRESS TEST  12/30/2013   abnormal  . COLONOSCOPY    . coronary stents     . CYSTOSCOPY WITH URETEROSCOPY Right 05/20/2013   Procedure: CYSTOSCOPY, RIGHT URETEROSCOPY STONE EXTRACTION, Insertion of right DOUBLE J STENT ;  Surgeon: Irine Seal, MD;  Location: [facility];  Service: Urology;  Laterality: Right;  . CYSTOSCOPY WITH URETEROSCOPY AND STENT PLACEMENT N/A 06/03/2013   Procedure: SECOND LOOK CYSTOSCOPY WITH URETEROSCOPY  HOLMIUM LASER LITHO AND STONE EXTRACTION Sammie Bench ;  Surgeon: Malka So, MD;  Location: [facility];  Service: Urology;  Laterality: N/A;  . HEMORROIDECTOMY  80's  . HOLMIUM LASER APPLICATION Right 07/16/6965   Procedure: HOLMIUM LASER APPLICATION;  Surgeon: Irine Seal, MD;  Location: [facility];  Service: Urology;  Laterality: Right;  . HYSTEROSCOPY W/D&C  09/02/2010   Procedure: DILATATION AND CURETTAGE (D&C) /HYSTEROSCOPY;  Surgeon: Margarette Asal;  Location: [facility];  Service: Gynecology;  Laterality: N/A;  Dilation and Curettage with Hysteroscopy and Polypectomy  . IMPLANTABLE CARDIOVERTER DEFIBRILLATOR (ICD)  GENERATOR CHANGE N/A 02/10/2014   Procedure: ICD GENERATOR CHANGE;  Surgeon: Sanda Klein, MD;  Location: [facility];  Service: Cardiovascular;  Laterality: N/A;  . LEFT HEART CATHETERIZATION WITH CORONARY ANGIOGRAM N/A 07/28/2011   Procedure: LEFT HEART CATHETERIZATION WITH CORONARY ANGIOGRAM;  Surgeon: Lorretta Harp, MD;  Location: [facility];  Service: Cardiovascular;  Laterality: N/A;  . LEFT HEART CATHETERIZATION WITH CORONARY ANGIOGRAM N/A 12/31/2013   Procedure: LEFT HEART CATHETERIZATION WITH CORONARY ANGIOGRAM;  Surgeon: Wellington Hampshire, MD;  Location: [facility];  Service: Cardiovascular;  Laterality: N/A;    Current Medications: Outpatient Medications Prior to Visit  Medication Sig Dispense Refill  . apixaban (ELIQUIS) 5 MG TABS tablet Take 1 tablet (5 mg total) by mouth 2 (two) times daily. 180 tablet 1  . Calcium Citrate-Vitamin D (CITRACAL + D PO) Take 1 tablet by mouth daily.    . Cholecalciferol (VITAMIN D) 1000  UNITS capsule Take 2,000 Units by mouth daily.     Marland Kitchen gabapentin (NEURONTIN) 100 MG capsule Take 1 capsule (100 mg total) by mouth at bedtime. 90 capsule 0  . glucose blood (ACCU-CHEK AVIVA PLUS) test strip Use to check BG once daily.  DX: E11.9 100 each 2  . Lancet Devices (ACCU-CHEK SOFTCLIX) lancets Use to check BG once daily.  Dx:  E11.9 100 each 2  . lisinopril (PRINIVIL,ZESTRIL) 20 MG tablet TAKE  (1)  TABLET TWICE A DAY. 180 tablet 1  . methocarbamol (ROBAXIN) 500 MG tablet Take 1 tablet (500 mg total) by mouth 2 (two) times daily. 20 tablet 0  . Multiple Vitamins-Minerals (CENTRUM SILVER ULTRA WOMENS PO) Take 1 tablet by mouth daily.    . nitroGLYCERIN (NITROSTAT) 0.4 MG SL tablet Place 1 tablet (0.4 mg total) under the tongue every 5 (five) minutes as needed for chest pain. 25 tablet 4  . ondansetron (ZOFRAN ODT) 4 MG disintegrating tablet 4mg  ODT q4 hours prn nausea/vomit 4 tablet 0  . oxyCODONE-acetaminophen (PERCOCET/ROXICET) 5-325 MG tablet 1 or 2 tabs  PO q12h prn pain 12 tablet 0  . pantoprazole (PROTONIX) 40 MG tablet TKAE 1 TABLET 2 TIMES A DAY 60 tablet 6  . PARoxetine (PAXIL) 20 MG tablet Take 1 tablet (20 mg total) by mouth every morning. 90 tablet 0  . rosuvastatin (CRESTOR) 20 MG tablet Take 1 tablet (20 mg total) by mouth daily. (Patient taking differently: Take 10 mg by mouth daily. ) 90 tablet 3  . torsemide (DEMADEX) 20 MG tablet Take 20 mg by mouth daily as needed (edema).    . triamcinolone (KENALOG) 0.025 % ointment Apply 1 application topically 2 (two) times daily. On lower back rash 80 g 0  . metoprolol tartrate (LOPRESSOR) 25 MG tablet TAKE (1) TABLET TWICE A DAY AT 10 A.M. & 10 P.M. 180 tablet 1   Facility-Administered Medications Prior to Visit  Medication Dose Route Frequency Provider Last Rate Last Dose  . 0.9 %  sodium chloride infusion  500 mL Intravenous Continuous Irene Shipper, MD         Allergies:   Bactrim [sulfamethoxazole-trimethoprim]; Actos [pioglitazone]; Adhesive [tape]; Contrast media [iodinated diagnostic agents]; Latex; and Lipitor [atorvastatin]   Social History   Social History  . Marital status: Widowed    Spouse name: N/A  . Number of children: N/A  . Years of education: N/A   Social History Main Topics  . Smoking status: Never Smoker  . Smokeless tobacco: Never Used  . Alcohol use No  . Drug use: No  . Sexual activity: No   Other Topics Concern  . None   Social History Narrative  . None     Family History:  The patient's family history includes Asthma in her brother; Atrial fibrillation in her sister; Cancer in her daughter; Colon polyps in her daughter; Diabetes in her daughter; Heart attack in her brother and father; Heart disease in her brother; Hyperlipidemia in her sister; Macular degeneration in her sister; Osteoporosis in her sister; Other in her father and mother; Stroke in her mother, sister, and sister; Uterine cancer in her sister.   ROS:   Please see the history of  present illness.    ROS All other systems reviewed and are negative.   PHYSICAL EXAM:   VS:  BP (!) 150/90   Pulse 74   Ht 5\' 3"  (1.6 m)   Wt 173 lb (78.5 kg)   BMI 30.65 kg/m  General: Alert, oriented x3, no distress, Appears pale Head: no evidence of trauma, PERRL, EOMI, no exophtalmos or lid lag, no myxedema, no xanthelasma; normal ears, nose and oropharynx Neck: normal jugular venous pulsations and no hepatojugular reflux; brisk carotid pulses without delay and no carotid bruits Chest: clear to auscultation, no signs of consolidation by percussion or palpation, normal fremitus, symmetrical and full respiratory excursions Cardiovascular: normal position and quality of the apical impulse, irregular rhythm, normal first and second heart sounds, no murmurs, rubs or gallops, healthy left subclavian defibrillator site Abdomen: no tenderness or distention, no masses by palpation, no abnormal pulsatility or arterial bruits, normal bowel sounds, no hepatosplenomegaly Extremities: no clubbing, cyanosis or edema; 2+ radial, ulnar and brachial pulses bilaterally; 2+ right femoral, posterior tibial and dorsalis pedis pulses; 2+ left femoral, posterior tibial and dorsalis pedis pulses; no subclavian or femoral bruits Neurological: grossly nonfocal Psych: euthymic mood, full affect  Wt Readings from Last 3 Encounters:  09/01/16 173 lb (78.5 kg)  08/30/16 172 lb 3.2 oz (78.1 kg)  08/19/16 170 lb 12.8 oz (77.5 kg)      Studies/Labs Reviewed:   EKG:  EKG is not ordered today.    Recent Labs: 10/21/2015: TSH 3.130 08/30/2016: ALT 14; BUN 24; Creatinine, Ser 1.26; Hemoglobin 10.3; Magnesium 2.2; Platelets 333; Potassium 5.0; Sodium 136   Lipid Panel    Component Value Date/Time   CHOL 152 07/13/2016 1035   CHOL 140 07/31/2012 1019   TRIG 97 07/13/2016 1035   TRIG 115 06/07/2015 1111   TRIG 161 (H) 07/31/2012 1019   HDL 60 07/13/2016 1035   HDL 68 06/07/2015 1111   HDL 43 07/31/2012  1019   CHOLHDL 2.5 07/13/2016 1035   CHOLHDL 2.8 09/02/2010 0629   VLDL 34 09/02/2010 0629   LDLCALC 73 07/13/2016 1035   LDLCALC 65 09/09/2013 0841   LDLCALC 65 07/31/2012 1019     ASSESSMENT:    1. Persistent atrial fibrillation ([place])   2. Long term current use of anticoagulant   3. Catecholaminergic polymorphic ventricular tachycardia ([place])   4. CAD in native artery   5. Orthostatic hypotension   6. Bradycardia, drug induced   7. Diabetes mellitus type 2, diet-controlled ([place])   8. CKD (chronic kidney disease) stage 3, GFR 30-59 ml/min   9. Automatic implantable cardioverter-defibrillator in situ   10. Hyperlipidemia   11. Other fatigue      PLAN:  In order of problems listed above:  1. AFib: This was noted at the time of her initial VT cardiac arrest, but recurred in 2015 during an episode of urosepsis. This is her first episode of persistent atrial fibrillation since then. She is on appropriate anticoagulation and has not had bleeding or embolic complications. She is symptomatic despite good ventricular rate control. Discussed options for management and we plan to perform a cardioversion on Monday. Discussed the fact that she cannot stop anticoagulation for a month afterwards. This is especially relevant due to her new anemia and possible history of melena. This procedure has been fully reviewed with the patient and written informed consent has been obtained. 2. Eliquis: She has advanced age, but her renal function and weight are appropriate for full dose anticoagulant. Although she has not had overt hematochezia she may have had melena a few weeks ago. Her EGD was normal on July 3. She has not had a colonoscopy in 5 years. She might need a capsule enteroscopy. We'll check a hemoglobin today and may have to cancel  the planned cardioversion if it has dropped lower  (10.3 on August 15). 3. VT: No events and no defibrillator discharges since device implantation in 1993. Disopyramide  stopped 7 months ago. No ventricular tachycardia has been seen since then. 4. CAD: This became a problem long after her initial episode of VT. She required percutaneous revascularization well over 10 years ago, without any new problems since. Her most recent cardiac catheterization in 2015 did not show new stenoses. She does not have angina pectoris currently. 5. Orthostatic hypotension: Seems to be better after discontinuation of disopyramide. Her blood pressure is a little high today, but she looks uncomfortable. Will reevaluate after her cardioversion. 6. Bradycardia: She has a very low burden of ventricular pacing. Have previously reviewed the possibility of need for upgrade of her device to a dual-chamber defibrillator if she starts having worse bradycardia. We'll try to keep her on the higher dose of beta blocker even after her cardioversion, if heart rate allows. 7. DM: Diet-controlled 8. CKD: last GFR around 45 mL/min, stable 9. ICD: Normal device function. Remote download every 3 months. 10. HLP:  On statin, her LDL is close to target <70  Medication Adjustments/Labs and Tests Ordered: Current medicines are reviewed at length with the patient today.  Concerns regarding medicines are outlined above.  Medication changes, Labs and Tests ordered today are listed in the Patient Instructions below. Patient Instructions  Your physician has recommended that you have a Cardioversion (DCCV). Electrical Cardioversion uses a jolt of electricity to your heart either through paddles or wired patches attached to your chest. This is a controlled, usually prescheduled, procedure. Defibrillation is done under light anesthesia in the hospital, and you usually go home the day of the procedure. This is done to get your heart back into a normal rhythm. You are not awake for the procedure. Please see the instruction sheet given to you today.  You will receive a phone call with further details, once the procedure is  scheduled.  Your physician recommends that you return for lab work TODAY.  Dr Sallyanne Kuster recommends that you schedule a follow-up appointment in 1 month.  If you need a refill on your cardiac medications before your next appointment, please call your pharmacy.    Signed, Sanda Klein, MD  09/01/2016 1:30 PM    [facility] [address], [place], [place]  [address] Phone: [phone]; Fax: [phone]

## 2016-09-01 NOTE — Patient Instructions (Signed)
Your physician has recommended that you have a Cardioversion (DCCV). Electrical Cardioversion uses a jolt of electricity to your heart either through paddles or wired patches attached to your chest. This is a controlled, usually prescheduled, procedure. Defibrillation is done under light anesthesia in the hospital, and you usually go home the day of the procedure. This is done to get your heart back into a normal rhythm. You are not awake for the procedure. Please see the instruction sheet given to you today.  You will receive a phone call with further details, once the procedure is scheduled.  Your physician recommends that you return for lab work TODAY.  Dr Sallyanne Kuster recommends that you schedule a follow-up appointment in 1 month.  If you need a refill on your cardiac medications before your next appointment, please call your pharmacy.

## 2016-09-02 LAB — TSH: TSH: 2.99 u[IU]/mL (ref 0.450–4.500)

## 2016-09-02 LAB — HEMOGLOBIN: HEMOGLOBIN: 9.9 g/dL — AB (ref 11.1–15.9)

## 2016-09-04 ENCOUNTER — Ambulatory Visit (HOSPITAL_COMMUNITY): Payer: Medicare Other | Admitting: Anesthesiology

## 2016-09-04 ENCOUNTER — Ambulatory Visit: Payer: Medicare Other | Admitting: Pediatrics

## 2016-09-04 ENCOUNTER — Encounter (HOSPITAL_COMMUNITY)
Admission: RE | Disposition: A | Discharge status: Home / Self Care | Payer: Self-pay | Source: Ambulatory Visit | Attending: Cardiovascular Disease

## 2016-09-04 ENCOUNTER — Encounter (HOSPITAL_COMMUNITY): Payer: Self-pay | Admitting: *Deleted

## 2016-09-04 ENCOUNTER — Ambulatory Visit (HOSPITAL_COMMUNITY)
Admission: RE | Admit: 2016-09-04 | Discharge: 2016-09-04 | Disposition: A | Discharge status: Home / Self Care | Payer: Medicare Other | Source: Ambulatory Visit | Attending: Cardiovascular Disease | Admitting: Cardiovascular Disease

## 2016-09-04 DIAGNOSIS — I252 Old myocardial infarction: Secondary | ICD-10-CM | POA: Insufficient documentation

## 2016-09-04 DIAGNOSIS — E1122 Type 2 diabetes mellitus with diabetic chronic kidney disease: Secondary | ICD-10-CM | POA: Diagnosis not present

## 2016-09-04 DIAGNOSIS — I481 Persistent atrial fibrillation: Secondary | ICD-10-CM | POA: Diagnosis not present

## 2016-09-04 DIAGNOSIS — M1990 Unspecified osteoarthritis, unspecified site: Secondary | ICD-10-CM | POA: Diagnosis not present

## 2016-09-04 DIAGNOSIS — K219 Gastro-esophageal reflux disease without esophagitis: Secondary | ICD-10-CM | POA: Diagnosis not present

## 2016-09-04 DIAGNOSIS — Z95810 Presence of automatic (implantable) cardiac defibrillator: Secondary | ICD-10-CM | POA: Insufficient documentation

## 2016-09-04 DIAGNOSIS — Z955 Presence of coronary angioplasty implant and graft: Secondary | ICD-10-CM | POA: Insufficient documentation

## 2016-09-04 DIAGNOSIS — I510 Cardiac septal defect, acquired: Secondary | ICD-10-CM | POA: Insufficient documentation

## 2016-09-04 DIAGNOSIS — Z5309 Procedure and treatment not carried out because of other contraindication: Secondary | ICD-10-CM | POA: Insufficient documentation

## 2016-09-04 DIAGNOSIS — E1151 Type 2 diabetes mellitus with diabetic peripheral angiopathy without gangrene: Secondary | ICD-10-CM | POA: Diagnosis not present

## 2016-09-04 DIAGNOSIS — R0602 Shortness of breath: Secondary | ICD-10-CM | POA: Insufficient documentation

## 2016-09-04 DIAGNOSIS — Z86718 Personal history of other venous thrombosis and embolism: Secondary | ICD-10-CM | POA: Insufficient documentation

## 2016-09-04 DIAGNOSIS — Z7901 Long term (current) use of anticoagulants: Secondary | ICD-10-CM | POA: Diagnosis not present

## 2016-09-04 DIAGNOSIS — I129 Hypertensive chronic kidney disease with stage 1 through stage 4 chronic kidney disease, or unspecified chronic kidney disease: Secondary | ICD-10-CM | POA: Insufficient documentation

## 2016-09-04 DIAGNOSIS — N183 Chronic kidney disease, stage 3 (moderate): Secondary | ICD-10-CM | POA: Diagnosis not present

## 2016-09-04 DIAGNOSIS — Z8674 Personal history of sudden cardiac arrest: Secondary | ICD-10-CM | POA: Insufficient documentation

## 2016-09-04 DIAGNOSIS — G8929 Other chronic pain: Secondary | ICD-10-CM | POA: Diagnosis not present

## 2016-09-04 DIAGNOSIS — Z79899 Other long term (current) drug therapy: Secondary | ICD-10-CM | POA: Insufficient documentation

## 2016-09-04 DIAGNOSIS — Z79891 Long term (current) use of opiate analgesic: Secondary | ICD-10-CM | POA: Insufficient documentation

## 2016-09-04 DIAGNOSIS — R42 Dizziness and giddiness: Secondary | ICD-10-CM | POA: Insufficient documentation

## 2016-09-04 DIAGNOSIS — E785 Hyperlipidemia, unspecified: Secondary | ICD-10-CM | POA: Insufficient documentation

## 2016-09-04 DIAGNOSIS — I4819 Other persistent atrial fibrillation: Secondary | ICD-10-CM

## 2016-09-04 DIAGNOSIS — F419 Anxiety disorder, unspecified: Secondary | ICD-10-CM | POA: Insufficient documentation

## 2016-09-04 DIAGNOSIS — R51 Headache: Secondary | ICD-10-CM | POA: Diagnosis not present

## 2016-09-04 SURGERY — CANCELLED PROCEDURE
Anesthesia: General

## 2016-09-04 MED ORDER — SODIUM CHLORIDE 0.9 % IV SOLN
INTRAVENOUS | Status: DC
  Administered 2016-09-04: 500 mL via INTRAVENOUS

## 2016-09-04 MED ORDER — ONDANSETRON HCL 4 MG/2ML IJ SOLN: 4.0000 mg | Freq: Once | INTRAMUSCULAR | Status: DC | PRN

## 2016-09-04 MED ORDER — FENTANYL CITRATE (PF) 100 MCG/2ML IJ SOLN: 25.0000 ug | INTRAMUSCULAR | Status: DC | PRN

## 2016-09-04 MED ORDER — HYDROCORTISONE 1 % EX CREA
1.0000 "application " | TOPICAL_CREAM | Freq: Three times a day (TID) | CUTANEOUS | Status: DC | PRN
  Filled 2016-09-04: qty 28

## 2016-09-04 NOTE — Interval H&P Note (Signed)
History and Physical Interval Note:  09/04/2016 12:38 PM  [name]  has presented today for surgery, with the diagnosis of a fib  The various methods of treatment have been discussed with the patient and family. After consideration of risks, benefits and other options for treatment, the patient has consented to  Procedure(s): CARDIOVERSION (N/A) as a surgical intervention .  The patient's history has been reviewed, patient examined, no change in status, stable for surgery.  I have reviewed the patient's chart and labs.  Questions were answered to the patient's satisfaction.     Dniyah Grant

## 2016-09-04 NOTE — H&P (View-Only) (Signed)
. Patient ID: [name], female   DOB: 22-Feb-1931, 83 y.o.   MRN: [id number]    Cardiology Office Note    Date:  09/01/2016   ID:  [name], DOB [birth year], MRN [id number]  PCP:  Chipper Herb, MD  Cardiologist:   Sanda Klein, MD   Chief Complaint  Patient presents with  . Follow-up  . Headache  . Shortness of Breath    History of Present Illness:  [name] is a 83 y.o. 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  She has felt poorly for about a week now. She feels very dizzy even though her blood pressure is normal. She had atrial fibrillation with rapid ventricular response and her maternal dose was increased. She now has good rate control with heart rate in the 70s but still feels weak and dizzy. She denies dyspnea at rest or angina pectoris. She has not experienced syncope.  She has recently developed anemia and her hemoglobin was roughly 10 (previous baseline 12). Previous workup with upper endoscopy a few weeks ago showed no abnormalities. She has not had a colonoscopy in 5 years, but her gastroenterologist, Dr. Henrene Pastor stated he would not perform 1. She has not had overt hematochezia, but she did have diarrhea with dark stools for a couple of weeks. None in the last week. She is on anticoagulation with Eliquis and has not interrupted the medication. She has been on a variety of antibiotics for last 3 months for urinary tract infections.  He was  on treatment with disopyramide for years for her history of catecholaminergic polymorphic VT, but this medication had to be gradually discontinued due to side effects, especially orthostatic hypotension and dry mouth.  Interrogation of her defibrillator shows no evidence of ventricular arrhythmia, since device implant in January 2016 Her estimated generator longevity is 12 years. She requires <1% ventricular pacing.    Past Medical History:  Diagnosis Date  . AICD (automatic cardioverter/defibrillator) present   . Allergy    SESONAL  . Anxiety   . ARTHRITIS   . Arthritis   . CAD (coronary artery disease)    a. history of cardiac arrest 1993;  b. s/p LAD/LCX stenting in 2003;  c. 07/2011 Cath: LM nl, LAD patent stent, D1 80ost, LCX patent stent, RCA min irregs;  d. 04/2012 MV: EF 66%, no ishcemia;  e. 12/2013 Echo: Ef 55%, no rwma, Gr 1 DD, triv AI/MR, mildly dil LA;  f. 12/2013 Lexi MV: intermediate risk - apical ischemia and inf/infsept fixed defect, ? artifactual.  . CAD in native artery 12/31/2013   Previous stents to LAD and Ramus patent on cath today 12/31/13 also There is severe disease in the ostial first diagonal which is unchanged from most recent cardiac catheterization. The right coronary artery could not be engaged selectively but nonselective angiography showed no significant disease in the proximal and midsegment.     . Cataract    DENIES  . Chronic back pain   . Chronic sinus bradycardia   . CKD (chronic kidney disease), stage III   . Clotting disorder (  [place])    DVT  . Diabetes mellitus without complication ([place])    DENIES  . Diverticulosis of colon (without mention of hemorrhage) 2007   Colonoscopy   . Esophageal stricture    a. 2012 s/p dil.  . Esophagitis, unspecified    a. 2012 EGD  . EXTERNAL HEMORRHOIDS   . GERD (gastroesophageal reflux disease)    omeprazole  . H/O hiatal hernia   . Hiatal hernia    a. 2012 EGD.  Marland Kitchen History of DVT in the past, not on Coumadin  now    left  leg  . HYPERCHOLESTEROLEMIA   . Hypertension   . ICD (implantable cardiac defibrillator) in place    a. s/p initial ICD in 1993 in setting of cardiac arrest;  b. 01/2007 gen change: Guidant T135 Vitality DS VR single lead ICD.  . Macular degeneration    gets injecton in eye every 5 weeks- last injection - 05/03/2013   . Myocardial infarction ([place]) 1993  . Nephrolithiasis, just saw Dr [name]   . Orthostatic hypotension   . Peripheral vascular disease ([place])    ???  . Unspecified gastritis and gastroduodenitis without mention of hemorrhage    a. 2003 EGD->not noted on 2012 EGD.    Past Surgical History:  Procedure Laterality Date  . CARDIAC CATHETERIZATION    . CARDIAC CATHETERIZATION  01/01/15   patent stents -there is severe disease in ostial 1st diag but without change.   Marland Kitchen CARDIAC DEFIBRILLATOR PLACEMENT  02/05/2007   Guidant  . CARDIOVASCULAR STRESS TEST  12/30/2013   abnormal  . COLONOSCOPY    . coronary stents     . CYSTOSCOPY WITH URETEROSCOPY Right 05/20/2013   Procedure: CYSTOSCOPY, RIGHT URETEROSCOPY STONE EXTRACTION, Insertion of right DOUBLE J STENT ;  Surgeon: Irine Seal, MD;  Location: [facility];  Service: Urology;  Laterality: Right;  . CYSTOSCOPY WITH URETEROSCOPY AND STENT PLACEMENT N/A 06/03/2013   Procedure: SECOND LOOK CYSTOSCOPY WITH URETEROSCOPY  HOLMIUM LASER LITHO AND STONE EXTRACTION Sammie Bench ;  Surgeon: Malka So, MD;  Location: [facility];  Service: Urology;  Laterality: N/A;  . HEMORROIDECTOMY  80's  . HOLMIUM LASER APPLICATION Right 03/18/2949   Procedure: HOLMIUM LASER APPLICATION;  Surgeon: Irine Seal, MD;  Location: [facility];  Service: Urology;  Laterality: Right;  . HYSTEROSCOPY W/D&C  09/02/2010   Procedure: DILATATION AND CURETTAGE (D&C) /HYSTEROSCOPY;  Surgeon: Margarette Asal;  Location: [facility];  Service: Gynecology;  Laterality: N/A;  Dilation and Curettage with Hysteroscopy and Polypectomy  . IMPLANTABLE CARDIOVERTER DEFIBRILLATOR (ICD)  GENERATOR CHANGE N/A 02/10/2014   Procedure: ICD GENERATOR CHANGE;  Surgeon: Sanda Klein, MD;  Location: [facility];  Service: Cardiovascular;  Laterality: N/A;  . LEFT HEART CATHETERIZATION WITH CORONARY ANGIOGRAM N/A 07/28/2011   Procedure: LEFT HEART CATHETERIZATION WITH CORONARY ANGIOGRAM;  Surgeon: Lorretta Harp, MD;  Location: [facility] C[facility];  Service: Cardiovascular;  Laterality: N/A;  . LEFT HEART CATHETERIZATION WITH CORONARY ANGIOGRAM N/A 12/31/2013   Procedure: LEFT HEART CATHETERIZATION WITH CORONARY ANGIOGRAM;  Surgeon: Wellington Hampshire, MD;  Location: [facility];  Service: Cardiovascular;  Laterality: N/A;    Current Medications: Outpatient Medications Prior to Visit  Medication Sig Dispense Refill  . apixaban (ELIQUIS) 5 MG TABS tablet Take 1 tablet (5 mg total) by mouth 2 (two) times daily. 180 tablet 1  . Calcium Citrate-Vitamin D (CITRACAL + D PO) Take 1 tablet by mouth daily.    . Cholecalciferol (VITAMIN D) 1000  UNITS capsule Take 2,000 Units by mouth daily.     Marland Kitchen gabapentin (NEURONTIN) 100 MG capsule Take 1 capsule (100 mg total) by mouth at bedtime. 90 capsule 0  . glucose blood (ACCU-CHEK AVIVA PLUS) test strip Use to check BG once daily.  DX: E11.9 100 each 2  . Lancet Devices (ACCU-CHEK SOFTCLIX) lancets Use to check BG once daily.  Dx:  E11.9 100 each 2  . lisinopril (PRINIVIL,ZESTRIL) 20 MG tablet TAKE  (1)  TABLET TWICE A DAY. 180 tablet 1  . methocarbamol (ROBAXIN) 500 MG tablet Take 1 tablet (500 mg total) by mouth 2 (two) times daily. 20 tablet 0  . Multiple Vitamins-Minerals (CENTRUM SILVER ULTRA WOMENS PO) Take 1 tablet by mouth daily.    . nitroGLYCERIN (NITROSTAT) 0.4 MG SL tablet Place 1 tablet (0.4 mg total) under the tongue every 5 (five) minutes as needed for chest pain. 25 tablet 4  . ondansetron (ZOFRAN ODT) 4 MG disintegrating tablet 4mg  ODT q4 hours prn nausea/vomit 4 tablet 0  . oxyCODONE-acetaminophen (PERCOCET/ROXICET) 5-325 MG tablet 1 or 2 tabs  PO q12h prn pain 12 tablet 0  . pantoprazole (PROTONIX) 40 MG tablet TKAE 1 TABLET 2 TIMES A DAY 60 tablet 6  . PARoxetine (PAXIL) 20 MG tablet Take 1 tablet (20 mg total) by mouth every morning. 90 tablet 0  . rosuvastatin (CRESTOR) 20 MG tablet Take 1 tablet (20 mg total) by mouth daily. (Patient taking differently: Take 10 mg by mouth daily. ) 90 tablet 3  . torsemide (DEMADEX) 20 MG tablet Take 20 mg by mouth daily as needed (edema).    . triamcinolone (KENALOG) 0.025 % ointment Apply 1 application topically 2 (two) times daily. On lower back rash 80 g 0  . metoprolol tartrate (LOPRESSOR) 25 MG tablet TAKE (1) TABLET TWICE A DAY AT 10 A.M. & 10 P.M. 180 tablet 1   Facility-Administered Medications Prior to Visit  Medication Dose Route Frequency Provider Last Rate Last Dose  . 0.9 %  sodium chloride infusion  500 mL Intravenous Continuous Irene Shipper, MD         Allergies:   Bactrim [sulfamethoxazole-trimethoprim]; Actos [pioglitazone]; Adhesive [tape]; Contrast media [iodinated diagnostic agents]; Latex; and Lipitor [atorvastatin]   Social History   Social History  . Marital status: Widowed    Spouse name: N/A  . Number of children: N/A  . Years of education: N/A   Social History Main Topics  . Smoking status: Never Smoker  . Smokeless tobacco: Never Used  . Alcohol use No  . Drug use: No  . Sexual activity: No   Other Topics Concern  . None   Social History Narrative  . None     Family History:  The patient's family history includes Asthma in her brother; Atrial fibrillation in her sister; Cancer in her daughter; Colon polyps in her daughter; Diabetes in her daughter; Heart attack in her brother and father; Heart disease in her brother; Hyperlipidemia in her sister; Macular degeneration in her sister; Osteoporosis in her sister; Other in her father and mother; Stroke in her mother, sister, and sister; Uterine cancer in her sister.   ROS:   Please see the history of  present illness.    ROS All other systems reviewed and are negative.   PHYSICAL EXAM:   VS:  BP (!) 150/90   Pulse 74   Ht 5\' 3"  (1.6 m)   Wt 173 lb (78.5 kg)   BMI 30.65 kg/m  General: Alert, oriented x3, no distress, Appears pale Head: no evidence of trauma, PERRL, EOMI, no exophtalmos or lid lag, no myxedema, no xanthelasma; normal ears, nose and oropharynx Neck: normal jugular venous pulsations and no hepatojugular reflux; brisk carotid pulses without delay and no carotid bruits Chest: clear to auscultation, no signs of consolidation by percussion or palpation, normal fremitus, symmetrical and full respiratory excursions Cardiovascular: normal position and quality of the apical impulse, irregular rhythm, normal first and second heart sounds, no murmurs, rubs or gallops, healthy left subclavian defibrillator site Abdomen: no tenderness or distention, no masses by palpation, no abnormal pulsatility or arterial bruits, normal bowel sounds, no hepatosplenomegaly Extremities: no clubbing, cyanosis or edema; 2+ radial, ulnar and brachial pulses bilaterally; 2+ right femoral, posterior tibial and dorsalis pedis pulses; 2+ left femoral, posterior tibial and dorsalis pedis pulses; no subclavian or femoral bruits Neurological: grossly nonfocal Psych: euthymic mood, full affect  Wt Readings from Last 3 Encounters:  09/01/16 173 lb (78.5 kg)  08/30/16 172 lb 3.2 oz (78.1 kg)  08/19/16 170 lb 12.8 oz (77.5 kg)      Studies/Labs Reviewed:   EKG:  EKG is not ordered today.    Recent Labs: 10/21/2015: TSH 3.130 08/30/2016: ALT 14; BUN 24; Creatinine, Ser 1.26; Hemoglobin 10.3; Magnesium 2.2; Platelets 333; Potassium 5.0; Sodium 136   Lipid Panel    Component Value Date/Time   CHOL 152 07/13/2016 1035   CHOL 140 07/31/2012 1019   TRIG 97 07/13/2016 1035   TRIG 115 06/07/2015 1111   TRIG 161 (H) 07/31/2012 1019   HDL 60 07/13/2016 1035   HDL 68 06/07/2015 1111   HDL 43 07/31/2012  1019   CHOLHDL 2.5 07/13/2016 1035   CHOLHDL 2.8 09/02/2010 0629   VLDL 34 09/02/2010 0629   LDLCALC 73 07/13/2016 1035   LDLCALC 65 09/09/2013 0841   LDLCALC 65 07/31/2012 1019     ASSESSMENT:    1. Persistent atrial fibrillation ([place])   2. Long term current use of anticoagulant   3. Catecholaminergic polymorphic ventricular tachycardia ([place])   4. CAD in native artery   5. Orthostatic hypotension   6. Bradycardia, drug induced   7. Diabetes mellitus type 2, diet-controlled ([place])   8. CKD (chronic kidney disease) stage 3, GFR 30-59 ml/min   9. Automatic implantable cardioverter-defibrillator in situ   10. Hyperlipidemia   11. Other fatigue      PLAN:  In order of problems listed above:  1. AFib: This was noted at the time of her initial VT cardiac arrest, but recurred in 2015 during an episode of urosepsis. This is her first episode of persistent atrial fibrillation since then. She is on appropriate anticoagulation and has not had bleeding or embolic complications. She is symptomatic despite good ventricular rate control. Discussed options for management and we plan to perform a cardioversion on Monday. Discussed the fact that she cannot stop anticoagulation for a month afterwards. This is especially relevant due to her new anemia and possible history of melena. This procedure has been fully reviewed with the patient and written informed consent has been obtained. 2. Eliquis: She has advanced age, but her renal function and weight are appropriate for full dose anticoagulant. Although she has not had overt hematochezia she may have had melena a few weeks ago. Her EGD was normal on July 3. She has not had a colonoscopy in 5 years. She might need a capsule enteroscopy. We'll check a hemoglobin today and may have to cancel  the planned cardioversion if it has dropped lower  (10.3 on August 15). 3. VT: No events and no defibrillator discharges since device implantation in 1993. Disopyramide  stopped 7 months ago. No ventricular tachycardia has been seen since then. 4. CAD: This became a problem long after her initial episode of VT. She required percutaneous revascularization well over 10 years ago, without any new problems since. Her most recent cardiac catheterization in 2015 did not show new stenoses. She does not have angina pectoris currently. 5. Orthostatic hypotension: Seems to be better after discontinuation of disopyramide. Her blood pressure is a little high today, but she looks uncomfortable. Will reevaluate after her cardioversion. 6. Bradycardia: She has a very low burden of ventricular pacing. Have previously reviewed the possibility of need for upgrade of her device to a dual-chamber defibrillator if she starts having worse bradycardia. We'll try to keep her on the higher dose of beta blocker even after her cardioversion, if heart rate allows. 7. DM: Diet-controlled 8. CKD: last GFR around 45 mL/min, stable 9. ICD: Normal device function. Remote download every 3 months. 10. HLP:  On statin, her LDL is close to target <70  Medication Adjustments/Labs and Tests Ordered: Current medicines are reviewed at length with the patient today.  Concerns regarding medicines are outlined above.  Medication changes, Labs and Tests ordered today are listed in the Patient Instructions below. Patient Instructions  Your physician has recommended that you have a Cardioversion (DCCV). Electrical Cardioversion uses a jolt of electricity to your heart either through paddles or wired patches attached to your chest. This is a controlled, usually prescheduled, procedure. Defibrillation is done under light anesthesia in the hospital, and you usually go home the day of the procedure. This is done to get your heart back into a normal rhythm. You are not awake for the procedure. Please see the instruction sheet given to you today.  You will receive a phone call with further details, once the procedure is  scheduled.  Your physician recommends that you return for lab work TODAY.  Dr Sallyanne Kuster recommends that you schedule a follow-up appointment in 1 month.  If you need a refill on your cardiac medications before your next appointment, please call your pharmacy.    Signed, Sanda Klein, MD  09/01/2016 1:30 PM    [facility] [address], [place], [place]  [address] Phone: [phone]; Fax: [phone]

## 2016-09-04 NOTE — Op Note (Signed)
NSR when connected to monitor. Reprts symptoms improved starting yesterday. Cardioversion canceled. Sanda Klein, MD, [facility] [phone] office [phone] pager

## 2016-09-04 NOTE — Anesthesia Preprocedure Evaluation (Addendum)
Anesthesia Evaluation  Patient identified by MRN, date of birth, ID band Patient awake    Reviewed: Allergy & Precautions, NPO status , Patient's Chart, lab work & pertinent test results  Airway Mallampati: II  TM Distance: <3 FB Neck ROM: Full    Dental  (+) Dental Advisory Given, Lower Dentures, Upper Dentures   Pulmonary neg pulmonary ROS,    Pulmonary exam normal breath sounds clear to auscultation       Cardiovascular hypertension, Pt. on home beta blockers and Pt. on medications + CAD, + Past MI, + Cardiac Stents, + Peripheral Vascular Disease and + DVT  + Cardiac Defibrillator  Rhythm:Regular Rate:Normal     Neuro/Psych PSYCHIATRIC DISORDERS Anxiety negative neurological ROS     GI/Hepatic Neg liver ROS, hiatal hernia, GERD  Medicated,  Endo/Other  diabetesObesity   Renal/GU Renal InsufficiencyRenal disease     Musculoskeletal  (+) Arthritis ,   Abdominal   Peds  Hematology  (+) Blood dyscrasia (Eliquis), anemia ,   Anesthesia Other Findings Day of surgery medications reviewed with the patient.  Reproductive/Obstetrics                           Anesthesia Physical Anesthesia Plan  ASA: IV  Anesthesia Plan: General   Post-op Pain Management:    Induction: Intravenous  PONV Risk Score and Plan: 3 and Treatment may vary due to age or medical condition  Airway Management Planned: Mask  Additional Equipment:   Intra-op Plan:   Post-operative Plan:   Informed Consent: I have reviewed the patients History and Physical, chart, labs and discussed the procedure including the risks, benefits and alternatives for the proposed anesthesia with the patient or authorized representative who has indicated his/her understanding and acceptance.   Dental advisory given  Plan Discussed with: CRNA  Anesthesia Plan Comments: (CASE CANCELLED. PATIENT IN NORMAL SINUS RHYTHM, no need for  cardioversion.)       Anesthesia Quick Evaluation

## 2016-09-05 ENCOUNTER — Encounter (HOSPITAL_COMMUNITY): Payer: Self-pay | Admitting: Cardiovascular Disease

## 2016-09-05 DIAGNOSIS — H353212 Exudative age-related macular degeneration, right eye, with inactive choroidal neovascularization: Secondary | ICD-10-CM | POA: Diagnosis not present

## 2016-09-05 DIAGNOSIS — H353221 Exudative age-related macular degeneration, left eye, with active choroidal neovascularization: Secondary | ICD-10-CM | POA: Diagnosis not present

## 2016-09-05 DIAGNOSIS — H25813 Combined forms of age-related cataract, bilateral: Secondary | ICD-10-CM | POA: Diagnosis not present

## 2016-09-25 DIAGNOSIS — H25811 Combined forms of age-related cataract, right eye: Secondary | ICD-10-CM | POA: Diagnosis not present

## 2016-09-25 DIAGNOSIS — H2511 Age-related nuclear cataract, right eye: Secondary | ICD-10-CM | POA: Diagnosis not present

## 2016-10-05 DIAGNOSIS — H353211 Exudative age-related macular degeneration, right eye, with active choroidal neovascularization: Secondary | ICD-10-CM | POA: Diagnosis not present

## 2016-10-05 DIAGNOSIS — H353212 Exudative age-related macular degeneration, right eye, with inactive choroidal neovascularization: Secondary | ICD-10-CM | POA: Diagnosis not present

## 2016-10-05 DIAGNOSIS — H3561 Retinal hemorrhage, right eye: Secondary | ICD-10-CM | POA: Diagnosis not present

## 2016-10-05 DIAGNOSIS — H353221 Exudative age-related macular degeneration, left eye, with active choroidal neovascularization: Secondary | ICD-10-CM | POA: Diagnosis not present

## 2016-10-06 ENCOUNTER — Ambulatory Visit (INDEPENDENT_AMBULATORY_CARE_PROVIDER_SITE_OTHER): Payer: Medicare Other | Admitting: Cardiovascular Disease

## 2016-10-06 VITALS — BP 133/70 | HR 62 | Wt 171.0 lb

## 2016-10-06 DIAGNOSIS — Z7901 Long term (current) use of anticoagulants: Secondary | ICD-10-CM | POA: Diagnosis not present

## 2016-10-06 DIAGNOSIS — E7800 Pure hypercholesterolemia, unspecified: Secondary | ICD-10-CM

## 2016-10-06 DIAGNOSIS — I472 Ventricular tachycardia: Secondary | ICD-10-CM

## 2016-10-06 DIAGNOSIS — N183 Chronic kidney disease, stage 3 unspecified: Secondary | ICD-10-CM

## 2016-10-06 DIAGNOSIS — Z95810 Presence of automatic (implantable) cardiac defibrillator: Secondary | ICD-10-CM | POA: Diagnosis not present

## 2016-10-06 DIAGNOSIS — E119 Type 2 diabetes mellitus without complications: Secondary | ICD-10-CM

## 2016-10-06 DIAGNOSIS — D500 Iron deficiency anemia secondary to blood loss (chronic): Secondary | ICD-10-CM

## 2016-10-06 DIAGNOSIS — I2510 Atherosclerotic heart disease of native coronary artery without angina pectoris: Secondary | ICD-10-CM | POA: Diagnosis not present

## 2016-10-06 DIAGNOSIS — D649 Anemia, unspecified: Secondary | ICD-10-CM | POA: Diagnosis not present

## 2016-10-06 DIAGNOSIS — R001 Bradycardia, unspecified: Secondary | ICD-10-CM | POA: Diagnosis not present

## 2016-10-06 DIAGNOSIS — Z79899 Other long term (current) drug therapy: Secondary | ICD-10-CM | POA: Diagnosis not present

## 2016-10-06 DIAGNOSIS — I259 Chronic ischemic heart disease, unspecified: Secondary | ICD-10-CM | POA: Diagnosis not present

## 2016-10-06 DIAGNOSIS — I4729 Other ventricular tachycardia: Secondary | ICD-10-CM

## 2016-10-06 DIAGNOSIS — I480 Paroxysmal atrial fibrillation: Secondary | ICD-10-CM | POA: Diagnosis not present

## 2016-10-06 NOTE — Patient Instructions (Signed)
Dr Sallyanne Kuster recommends that you continue on your current medications as directed. Please refer to the Current Medication list given to you today.  Your physician recommends that you return for lab work TODAY.  Dr Sallyanne Kuster recommends that you schedule a follow-up appointment in 3 months with a defibrillator check.  If you need a refill on your cardiac medications before your next appointment, please call your pharmacy.  You have been referred to an electrophysiologist, Dr Caryl Comes, for symptomatic atrial fibrillation.

## 2016-10-06 NOTE — Progress Notes (Signed)
. Patient ID: [name], female   DOB: [birth year], 83 y.o.   MRN: [id number]    Cardiology Office Note    Date:  10/07/2016   ID:  Thursa, Emme [birth year], MRN [id number]  PCP:  Chipper Herb, MD  Cardiologist:   Sanda Klein, MD   Chief Complaint  Patient presents with  . Follow-up    DOE    History of Present Illness:  [name] is a 83 y.o. 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 (initial implantation 1993, new ventricular lead 2009, last generator change 2016, [employer]), history of infrequent paroxysmal atrial fibrillation (1993 and 2015 and 2018), subsequent problems with coronary artery disease requiring stents (LAD and left circumflex 2003, patent stents at coronary angiography in 2006, 2013 and late 2015), normal left ventricular systolic function, orthostatic hypotension, hyperlipidemia, type 2 diabetes mellitus, stage III chronic kidney disease.  She continues to feel poorly and thought that I would tell her she was in atrial fibrillation today, although she is still in sinus rhythm. She is pale. Repeated her CBC today and she remains anemic with a hemoglobin around 10. Iron studies confirm iron deficiency. She has not had any episodes of melena although her stool is sometimes dark.  She denies angina, syncope, dizziness or sustained palpitations. She has occasional isolated skipped beats. She has exertional dyspnea with minimal activity.  Previous workup with upper endoscopy a few months ago showed no abnormalities. She has not had a colonoscopy in 5 years, but her gastroenterologist, Dr. Henrene Pastor stated he would not perform one due to her age. She is on anticoagulation with Eliquis and has not interrupted the medication.   Shee was on treatment with disopyramide for years for her history of catecholaminergic polymorphic VT, but this medication had to be gradually discontinued due to side effects,  especially orthostatic hypotension and dry mouth.  Interrogation of her defibrillator shows no evidence of ventricular arrhythmia, since device implant in January 2016. She has a Paramedic. Her estimated generator longevity is 12 years. She requires <1% ventricular pacing. Since she does not have a 8 chewable lead, it's not possible to get a precise estimation of her atrial fibrillation, but the histograms do show the presence of heart rates occasionally in the 110-130 range, as opposed to a year ago when this was not present.   Past Medical History:  Diagnosis Date  . AICD (automatic cardioverter/defibrillator) present   . Allergy    SESONAL  . Anxiety   . ARTHRITIS   . Arthritis   . CAD (coronary artery disease)    a. history of cardiac arrest 1993;  b. s/p LAD/LCX stenting in 2003;  c. 07/2011 Cath: LM nl, LAD patent stent, D1 80ost, LCX patent stent, RCA min irregs;  d. 04/2012 MV: EF 66%, no ishcemia;  e. 12/2013 Echo: Ef 55%, no rwma, Gr 1 DD, triv AI/MR, mildly dil LA;  f. 12/2013 Lexi MV: intermediate risk - apical ischemia and inf/infsept fixed defect, ? artifactual.  . CAD in native artery 12/31/2013   Previous stents to LAD and Ramus patent on cath today 12/31/13 also There is severe disease in the ostial first diagonal which is unchanged from most recent cardiac catheterization. The right coronary artery could not be engaged selectively but nonselective angiography showed no significant disease in the proximal and midsegment.     . Cataract    DENIES  . Chronic back  pain   . Chronic sinus bradycardia   . CKD (chronic kidney disease), stage III   . Clotting disorder (HCC)    DVT  . Diabetes mellitus without complication ([place])    DENIES  . Diverticulosis of colon (without mention of hemorrhage) 2007   Colonoscopy   . Esophageal stricture    a. 2012 s/p dil.  . Esophagitis, unspecified    a. 2012 EGD  . EXTERNAL HEMORRHOIDS   . GERD  (gastroesophageal reflux disease)    omeprazole  . H/O hiatal hernia   . Hiatal hernia    a. 2012 EGD.  Marland Kitchen History of DVT in the past, not on Coumadin now    left  leg  . HYPERCHOLESTEROLEMIA   . Hypertension   . ICD (implantable cardiac defibrillator) in place    a. s/p initial ICD in 1993 in setting of cardiac arrest;  b. 01/2007 gen change: Guidant T135 Vitality DS VR single lead ICD.  . Macular degeneration    gets injecton in eye every 5 weeks- last injection - 05/03/2013   . Myocardial infarction ([place]) 1993  . Nephrolithiasis, just saw Dr [name]   . Orthostatic hypotension   . Peripheral vascular disease ([place])    ???  . Unspecified gastritis and gastroduodenitis without mention of hemorrhage    a. 2003 EGD->not noted on 2012 EGD.    Past Surgical History:  Procedure Laterality Date  . CARDIAC CATHETERIZATION    . CARDIAC CATHETERIZATION  01/01/15   patent stents -there is severe disease in ostial 1st diag but without change.   Marland Kitchen CARDIAC DEFIBRILLATOR PLACEMENT  02/05/2007   Guidant  . CARDIOVASCULAR STRESS TEST  12/30/2013   abnormal  . COLONOSCOPY    . coronary stents     . CYSTOSCOPY WITH URETEROSCOPY Right 05/20/2013   Procedure: CYSTOSCOPY, RIGHT URETEROSCOPY STONE EXTRACTION, Insertion of right DOUBLE J STENT ;  Surgeon: Irine Seal, MD;  Location: [facility];  Service: Urology;  Laterality: Right;  . CYSTOSCOPY WITH URETEROSCOPY AND STENT PLACEMENT N/A 06/03/2013   Procedure: SECOND LOOK CYSTOSCOPY WITH URETEROSCOPY  HOLMIUM LASER LITHO AND STONE EXTRACTION Sammie Bench ;  Surgeon: Malka So, MD;  Location: [facility];  Service: Urology;  Laterality: N/A;  . HEMORROIDECTOMY  80's  . HOLMIUM LASER APPLICATION Right 05/16/256   Procedure: HOLMIUM LASER APPLICATION;  Surgeon: Irine Seal, MD;  Location: [facility];  Service: Urology;  Laterality: Right;  . HYSTEROSCOPY W/D&C  09/02/2010   Procedure: DILATATION AND CURETTAGE (D&C) /HYSTEROSCOPY;  Surgeon: Margarette Asal;  Location:  [facility];  Service: Gynecology;  Laterality: N/A;  Dilation and Curettage with Hysteroscopy and Polypectomy  . IMPLANTABLE CARDIOVERTER DEFIBRILLATOR (ICD) GENERATOR CHANGE N/A 02/10/2014   Procedure: ICD GENERATOR CHANGE;  Surgeon: Sanda Klein, MD;  Location: [facility];  Service: Cardiovascular;  Laterality: N/A;  . LEFT HEART CATHETERIZATION WITH CORONARY ANGIOGRAM N/A 07/28/2011   Procedure: LEFT HEART CATHETERIZATION WITH CORONARY ANGIOGRAM;  Surgeon: Lorretta Harp, MD;  Location: [facility];  Service: Cardiovascular;  Laterality: N/A;  . LEFT HEART CATHETERIZATION WITH CORONARY ANGIOGRAM N/A 12/31/2013   Procedure: LEFT HEART CATHETERIZATION WITH CORONARY ANGIOGRAM;  Surgeon: Wellington Hampshire, MD;  Location: [facility];  Service: Cardiovascular;  Laterality: N/A;    Current Medications: Outpatient Medications Prior to Visit  Medication Sig Dispense Refill  . apixaban (ELIQUIS) 5 MG TABS tablet Take 1 tablet (5 mg total) by mouth 2 (two) times daily. 180 tablet 1  .  Calcium Citrate-Vitamin D (CITRACAL + D PO) Take 1 tablet by mouth daily.    . Cholecalciferol (VITAMIN D) 1000 UNITS capsule Take 2,000 Units by mouth daily.     Marland Kitchen gabapentin (NEURONTIN) 100 MG capsule Take 1 capsule (100 mg total) by mouth at bedtime. 90 capsule 0  . glucose blood (ACCU-CHEK AVIVA PLUS) test strip Use to check BG once daily.  DX: E11.9 100 each 2  . Lancet Devices (ACCU-CHEK SOFTCLIX) lancets Use to check BG once daily.  Dx:  E11.9 100 each 2  . lisinopril (PRINIVIL,ZESTRIL) 20 MG tablet TAKE  (1)  TABLET TWICE A DAY. 180 tablet 1  . methocarbamol (ROBAXIN) 500 MG tablet Take 1 tablet (500 mg total) by mouth 2 (two) times daily. 20 tablet 0  . metoprolol tartrate (LOPRESSOR) 50 MG tablet Take 50 mg by mouth 2 (two) times daily.    . Multiple Vitamins-Minerals (CENTRUM SILVER ULTRA WOMENS PO) Take 1 tablet by mouth daily.    . nitroGLYCERIN (NITROSTAT) 0.4 MG SL tablet Place 1 tablet (0.4 mg total) under  the tongue every 5 (five) minutes as needed for chest pain. 25 tablet 4  . ondansetron (ZOFRAN ODT) 4 MG disintegrating tablet 4mg  ODT q4 hours prn nausea/vomit 4 tablet 0  . oxyCODONE-acetaminophen (PERCOCET/ROXICET) 5-325 MG tablet 1 or 2 tabs PO q12h prn pain 12 tablet 0  . pantoprazole (PROTONIX) 40 MG tablet TKAE 1 TABLET 2 TIMES A DAY 60 tablet 6  . PARoxetine (PAXIL) 20 MG tablet Take 1 tablet (20 mg total) by mouth every morning. 90 tablet 0  . rosuvastatin (CRESTOR) 20 MG tablet Take 1 tablet (20 mg total) by mouth daily. (Patient taking differently: Take 10 mg by mouth daily. ) 90 tablet 3  . torsemide (DEMADEX) 20 MG tablet Take 20 mg by mouth daily as needed (edema).    . triamcinolone (KENALOG) 0.025 % ointment Apply 1 application topically 2 (two) times daily. On lower back rash 80 g 0   Facility-Administered Medications Prior to Visit  Medication Dose Route Frequency Provider Last Rate Last Dose  . 0.9 %  sodium chloride infusion  500 mL Intravenous Continuous Irene Shipper, MD         Allergies:   Bactrim [sulfamethoxazole-trimethoprim]; Actos [pioglitazone]; Adhesive [tape]; Contrast media [iodinated diagnostic agents]; Latex; and Lipitor [atorvastatin]   Social History   Social History  . Marital status: Widowed    Spouse name: N/A  . Number of children: N/A  . Years of education: N/A   Social History Main Topics  . Smoking status: Never Smoker  . Smokeless tobacco: Never Used  . Alcohol use No  . Drug use: No  . Sexual activity: No   Other Topics Concern  . Not on file   Social History Narrative  . No narrative on file     Family History:  The patient's family history includes Asthma in her brother; Atrial fibrillation in her sister; Cancer in her daughter; Colon polyps in her daughter; Diabetes in her daughter; Heart attack in her brother and father; Heart disease in her brother; Hyperlipidemia in her sister; Macular degeneration in her sister; Osteoporosis  in her sister; Other in her father and mother; Stroke in her mother, sister, and sister; Uterine cancer in her sister.   ROS:   Please see the history of present illness.    ROS All other systems reviewed and are negative.   PHYSICAL EXAM:   VS:  BP 133/70   Pulse  62   Wt 171 lb (77.6 kg)   SpO2 97%   BMI 30.29 kg/m      General: Alert, oriented x3, no distress, Looks pale Head: no evidence of trauma, PERRL, EOMI, no exophtalmos or lid lag, no myxedema, no xanthelasma; normal ears, nose and oropharynx Neck: normal jugular venous pulsations and no hepatojugular reflux; brisk carotid pulses without delay and no carotid bruits Chest: clear to auscultation, no signs of consolidation by percussion or palpation, normal fremitus, symmetrical and full respiratory excursions Cardiovascular: normal position and quality of the apical impulse, regular rhythm, normal first and second heart sounds, no murmurs, rubs or gallops, healthy subclavian defibrillator site Abdomen: no tenderness or distention, no masses by palpation, no abnormal pulsatility or arterial bruits, normal bowel sounds, no hepatosplenomegaly Extremities: no clubbing, cyanosis or edema; 2+ radial, ulnar and brachial pulses bilaterally; 2+ right femoral, posterior tibial and dorsalis pedis pulses; 2+ left femoral, posterior tibial and dorsalis pedis pulses; no subclavian or femoral bruits Neurological: grossly nonfocal Psych: Normal mood and affect   Wt Readings from Last 3 Encounters:  10/06/16 171 lb (77.6 kg)  09/04/16 173 lb (78.5 kg)  09/01/16 173 lb (78.5 kg)      Studies/Labs Reviewed:   EKG:  EKG is not ordered today.    Recent Labs: 08/30/2016: Magnesium 2.2 09/01/2016: TSH 2.990 10/06/2016: ALT 7; BUN 15; Creatinine, Ser 1.19; Hemoglobin 10.2; Platelets 328; Potassium 4.8; Sodium 136   Lipid Panel    Component Value Date/Time   CHOL 152 07/13/2016 1035   CHOL 140 07/31/2012 1019   TRIG 97 07/13/2016 1035    TRIG 115 06/07/2015 1111   TRIG 161 (H) 07/31/2012 1019   HDL 60 07/13/2016 1035   HDL 68 06/07/2015 1111   HDL 43 07/31/2012 1019   CHOLHDL 2.5 07/13/2016 1035   CHOLHDL 2.8 09/02/2010 0629   VLDL 34 09/02/2010 0629   LDLCALC 73 07/13/2016 1035   LDLCALC 65 09/09/2013 0841   LDLCALC 65 07/31/2012 1019     ASSESSMENT:    1. Paroxysmal atrial fibrillation, chads2 Vasc2 score of 5, on eliquis   2. Long term current use of anticoagulant   3. Iron deficiency anemia due to chronic blood loss   4. Catecholaminergic polymorphic ventricular tachycardia ([place])   5. CAD in native artery   6. Chronic ischemic heart disease   7. Diabetes mellitus type 2, diet-controlled ([place])   8. CKD (chronic kidney disease) stage 3, GFR 30-59 ml/min   9. Automatic implantable cardioverter-defibrillator in situ   10. Hyperlipidemia   11. Medication management      PLAN:  In order of problems listed above:  1. AFib: This was noted at the time of her initial VT cardiac arrest, but recurred in 2015 during an episode of urosepsis, then again in August 2018 (rate controlled on metoprolol, spontaneous conversion). CHADSVasc 4 (age 69, gender, DM). No history of TIA or stroke. She believes that she is continuing to have episodes of arrhythmia and this would be supported by her heart rate histogram distribution. In view of her history of polymorphic VT, not sure that there are very safe antiarrhythmic options. Will refer to see Dr. Caryl Comes again. 2. Eliquis: Has mild microcytic anemia and evidence of iron deficiency. Embolic risk is relatively low and its located temporarily discontinued anticoagulant if necessary for workup.  3. Iron deficiency anemia: We'll start iron supplements. She has had negative EGD recently and a negative colonoscopy 5 years ago. I think she needs  to see her gastroenterologist again. Might need capsule enteroscopy. 4. VT: No events and no defibrillator discharges since device implantation in  1993. Disopyramide stopped 8 months ago. She has not had VT since we stopped disopyramide. 5. CAD: Remains free of angina. She has not required percutaneous revascularization since 2003. This became a problem long after her initial episode of VT. Her most recent cardiac catheterization in 2015 did not show new stenoses.  6. Orthostatic hypotension: Improved after we stopped her disopyramide. Blood pressure is normal today. 7. DM: Diet-controlled 8. CKD: last GFR around 45 mL/min, stable 9. ICD: Normal device function. Continue remote downloads every 3 months and at least yearly office visits 10. HLP:  Very close to target of LDL<70, on statin.  Medication Adjustments/Labs and Tests Ordered: Current medicines are reviewed at length with the patient today.  Concerns regarding medicines are outlined above.  Medication changes, Labs and Tests ordered today are listed in the Patient Instructions below. Patient Instructions  Dr Sallyanne Kuster recommends that you continue on your current medications as directed. Please refer to the Current Medication list given to you today.  Your physician recommends that you return for lab work TODAY.  Dr Sallyanne Kuster recommends that you schedule a follow-up appointment in 3 months with a defibrillator check.  If you need a refill on your cardiac medications before your next appointment, please call your pharmacy.  You have been referred to an electrophysiologist, Dr Caryl Comes, for symptomatic atrial fibrillation.    Signed, Sanda Klein, MD  10/07/2016 5:15 PM    [facility] [address], [place], [place]  [address] Phone: [phone]; Fax: [phone]

## 2016-10-07 ENCOUNTER — Encounter: Payer: Self-pay | Admitting: Cardiovascular Disease

## 2016-10-07 LAB — COMPREHENSIVE METABOLIC PANEL
ALBUMIN: 3.6 g/dL (ref 3.5–4.7)
ALT: 7 IU/L (ref 0–32)
AST: 9 IU/L (ref 0–40)
Albumin/Globulin Ratio: 1.4 (ref 1.2–2.2)
Alkaline Phosphatase: 83 IU/L (ref 39–117)
BUN/Creatinine Ratio: 13 (ref 12–28)
BUN: 15 mg/dL (ref 8–27)
Bilirubin Total: 0.6 mg/dL (ref 0.0–1.2)
CALCIUM: 9 mg/dL (ref 8.7–10.3)
CHLORIDE: 103 mmol/L (ref 96–106)
CO2: 21 mmol/L (ref 20–29)
CREATININE: 1.19 mg/dL — AB (ref 0.57–1.00)
GFR, EST AFRICAN AMERICAN: 49 mL/min/{1.73_m2} — AB (ref >59)
GFR, EST NON AFRICAN AMERICAN: 43 mL/min/{1.73_m2} — AB (ref >59)
Globulin, Total: 2.6 g/dL (ref 1.5–4.5)
Glucose: 178 mg/dL — ABNORMAL HIGH (ref 65–99)
Potassium: 4.8 mmol/L (ref 3.5–5.2)
Sodium: 136 mmol/L (ref 134–144)
TOTAL PROTEIN: 6.2 g/dL (ref 6.0–8.5)

## 2016-10-07 LAB — CBC
HEMOGLOBIN: 10.2 g/dL — AB (ref 11.1–15.9)
Hematocrit: 32 % — ABNORMAL LOW (ref 34.0–46.6)
MCH: 26.5 pg — AB (ref 26.6–33.0)
MCHC: 31.9 g/dL (ref 31.5–35.7)
MCV: 83 fL (ref 79–97)
Platelets: 328 10*3/uL (ref 150–379)
RBC: 3.85 x10E6/uL (ref 3.77–5.28)
RDW: 15.9 % — ABNORMAL HIGH (ref 12.3–15.4)
WBC: 4.6 10*3/uL (ref 3.4–10.8)

## 2016-10-07 LAB — IRON AND TIBC
IRON SATURATION: 7 % — AB (ref 15–55)
IRON: 22 ug/dL — AB (ref 27–139)
TIBC: 325 ug/dL (ref 250–450)
UIBC: 303 ug/dL (ref 118–369)

## 2016-10-07 LAB — FERRITIN: FERRITIN: 16 ng/mL (ref 15–150)

## 2016-10-07 LAB — FOLATE: Folate: >20 ng/mL (ref >3.0)

## 2016-10-07 LAB — VITAMIN B12: Vitamin B-12: 713 pg/mL (ref 232–1245)

## 2016-10-09 ENCOUNTER — Telehealth: Payer: Self-pay | Admitting: Cardiovascular Disease

## 2016-10-09 MED ORDER — FERROUS SULFATE 325 (65 FE) MG PO TABS: 325.0000 mg | ORAL_TABLET | Freq: Two times a day (BID) | ORAL | 3 refills | Status: DC

## 2016-10-09 NOTE — Telephone Encounter (Signed)
Patient has been made aware of results and instructions. The Ferrous sulfate has been called in for her and she will call and make a GI appointment for follow up.  Notes recorded by Sanda Klein, MD on 10/07/2016 at 2:59 PM EDT Hemoglobin is not any worse than last month (but also has not improved). Iron stores are low, consistent with slow blood loss. Please start ferous sulfate 325 mg twice daily and see GI for repeat evaluation for bleeding. OK to temporarily stop Eliquis for testing. No plans for cardioversion in the near future.

## 2016-10-09 NOTE — Telephone Encounter (Signed)
New message     Pt is inquiring to see if you have lab results back yet

## 2016-10-11 ENCOUNTER — Telehealth: Payer: Self-pay | Admitting: Family Medicine

## 2016-10-12 NOTE — Telephone Encounter (Signed)
Discussed appt with cardio and an appt with Dr Henrene Pastor.

## 2016-10-23 ENCOUNTER — Encounter: Payer: Self-pay | Admitting: Internal Medicine

## 2016-10-23 ENCOUNTER — Ambulatory Visit (INDEPENDENT_AMBULATORY_CARE_PROVIDER_SITE_OTHER): Payer: Medicare Other | Admitting: Internal Medicine

## 2016-10-23 VITALS — BP 172/70 | HR 64 | Ht 62.25 in | Wt 167.1 lb

## 2016-10-23 DIAGNOSIS — D509 Iron deficiency anemia, unspecified: Secondary | ICD-10-CM

## 2016-10-23 DIAGNOSIS — R195 Other fecal abnormalities: Secondary | ICD-10-CM

## 2016-10-23 DIAGNOSIS — Z95810 Presence of automatic (implantable) cardiac defibrillator: Secondary | ICD-10-CM

## 2016-10-23 DIAGNOSIS — K219 Gastro-esophageal reflux disease without esophagitis: Secondary | ICD-10-CM | POA: Diagnosis not present

## 2016-10-23 DIAGNOSIS — Z7901 Long term (current) use of anticoagulants: Secondary | ICD-10-CM

## 2016-10-23 DIAGNOSIS — R197 Diarrhea, unspecified: Secondary | ICD-10-CM | POA: Diagnosis not present

## 2016-10-23 DIAGNOSIS — I259 Chronic ischemic heart disease, unspecified: Secondary | ICD-10-CM

## 2016-10-23 NOTE — Progress Notes (Signed)
HISTORY OF PRESENT ILLNESS:  [name] is a 83 y.o. female with MULTIPLE SIGNIFICANT medical problems including, but not limited to coronary artery disease with prior coronary artery stent placement, history of cardiac arrest status post ICD placement, hypertension, recurrent DVT on chronic anticoagulation therapy in the form of Eliquis, arthritis, chronic back pain, GERD complicated by peptic stricture requiring esophageal dilation, history of chronic diarrhea secondary to microscopic colitis, diabetes, and chronic renal insufficiency. She is sent today by her cardiologist Dr. Sallyanne Kuster regarding iron deficiency anemia. Review of outside blood work finds a normal hemoglobin of 13.6 06/11/2016. MCV at that time was 95. Most recent hemoglobin 10/06/2016 was 10.2 with MCV 83. Iron studies were consistent with iron deficiency with an iron saturation of 7% and ferritin level of 16. Her creatinine at that time was 1.19 with estimated GFR of approximately 43. Previous patient of Dr. Verl Blalock. I saw the patient June 2016 regarding reports of dark stools and Hemoccult positive stool as well as vague abdominal discomfort. Stool testing that they was Hemoccult negative and her hemoglobin was stable. Previous colonoscopies with Dr. Sharlett Iles 2003, 2007, and 2012 were all negative for neoplasia. Given her comorbidities and chronic anticoagulation virtual colonoscopy was performed in 2016. This was negative for polypoid or mass lesions. I saw the patient again 07/17/2016 for reports of dark stools. Brown stool at day on exam. We did proceed with diagnostic upper endoscopy on anticoagulation 07/18/2016. This was normal. The patient was placed on iron therapy twice daily little over one week ago. Her stools are dark secondary to iron. She is accompanied today by her daughter-in-law. She has a number of GI complaints which are not new. These include intermittent vague abdominal discomfort, intermittent diarrhea,  chronic back pain. She has noticed worsening fatigue with her anemia. Her weight has been stable.  REVIEW OF SYSTEMS:  All non-GI ROS negative except for sinus allergy, back pain, visual change, fatigue, itching, headaches, leg swelling, urinary leakage  Past Medical History:  Diagnosis Date  . AICD (automatic cardioverter/defibrillator) present   . Allergy    SESONAL  . Anxiety   . ARTHRITIS   . Arthritis   . Atrial fibrillation ([place])   . CAD (coronary artery disease)    a. history of cardiac arrest 1993;  b. s/p LAD/LCX stenting in 2003;  c. 07/2011 Cath: LM nl, LAD patent stent, D1 80ost, LCX patent stent, RCA min irregs;  d. 04/2012 MV: EF 66%, no ishcemia;  e. 12/2013 Echo: Ef 55%, no rwma, Gr 1 DD, triv AI/MR, mildly dil LA;  f. 12/2013 Lexi MV: intermediate risk - apical ischemia and inf/infsept fixed defect, ? artifactual.  . CAD in native artery 12/31/2013   Previous stents to LAD and Ramus patent on cath today 12/31/13 also There is severe disease in the ostial first diagonal which is unchanged from most recent cardiac catheterization. The right coronary artery could not be engaged selectively but nonselective angiography showed no significant disease in the proximal and midsegment.     . Cataract    DENIES  . Chronic back pain   . Chronic sinus bradycardia   . CKD (chronic kidney disease), stage III ([place])   . Clotting disorder (H[facility])    DVT  . Diabetes mellitus without complication ([place])    DENIES  . Diverticulosis of colon (without mention of hemorrhage) 2007   Colonoscopy   . Esophageal stricture    a. 2012 s/p dil.  . Esophagitis, unspecified  a. 2012 EGD  . EXTERNAL HEMORRHOIDS   . GERD (gastroesophageal reflux disease)    omeprazole  . H/O hiatal hernia   . Hiatal hernia    a. 2012 EGD.  Marland Kitchen History of DVT in the past, not on Coumadin now    left  leg  . HYPERCHOLESTEROLEMIA   . Hypertension   . ICD (implantable cardiac defibrillator) in place    a. s/p  initial ICD in 1993 in setting of cardiac arrest;  b. 01/2007 gen change: Guidant T135 Vitality DS VR single lead ICD.  . Macular degeneration    gets injecton in eye every 5 weeks- last injection - 05/03/2013   . Myocardial infarction ([place]) 1993  . Nephrolithiasis, just saw Dr [name]   . Orthostatic hypotension   . Peripheral vascular disease ([place])    ???  . Unspecified gastritis and gastroduodenitis without mention of hemorrhage    a. 2003 EGD->not noted on 2012 EGD.    Past Surgical History:  Procedure Laterality Date  . CARDIAC CATHETERIZATION    . CARDIAC CATHETERIZATION  01/01/15   patent stents -there is severe disease in ostial 1st diag but without change.   Marland Kitchen CARDIAC DEFIBRILLATOR PLACEMENT  02/05/2007   Guidant  . CARDIOVASCULAR STRESS TEST  12/30/2013   abnormal  . COLONOSCOPY    . coronary stents     . CYSTOSCOPY WITH URETEROSCOPY Right 05/20/2013   Procedure: CYSTOSCOPY, RIGHT URETEROSCOPY STONE EXTRACTION, Insertion of right DOUBLE J STENT ;  Surgeon: Irine Seal, MD;  Location: [facility];  Service: Urology;  Laterality: Right;  . CYSTOSCOPY WITH URETEROSCOPY AND STENT PLACEMENT N/A 06/03/2013   Procedure: SECOND LOOK CYSTOSCOPY WITH URETEROSCOPY  HOLMIUM LASER LITHO AND STONE EXTRACTION Sammie Bench ;  Surgeon: Malka So, MD;  Location: [facility];  Service: Urology;  Laterality: N/A;  . HEMORROIDECTOMY  80's  . HOLMIUM LASER APPLICATION Right 0/05/6977   Procedure: HOLMIUM LASER APPLICATION;  Surgeon: Irine Seal, MD;  Location: [facility];  Service: Urology;  Laterality: Right;  . HYSTEROSCOPY W/D&C  09/02/2010   Procedure: DILATATION AND CURETTAGE (D&C) /HYSTEROSCOPY;  Surgeon: Margarette Asal;  Location: [facility];  Service: Gynecology;  Laterality: N/A;  Dilation and Curettage with Hysteroscopy and Polypectomy  . IMPLANTABLE CARDIOVERTER DEFIBRILLATOR (ICD) GENERATOR CHANGE N/A 02/10/2014   Procedure: ICD GENERATOR CHANGE;  Surgeon: Sanda Klein, MD;  Location: [facility];   Service: Cardiovascular;  Laterality: N/A;  . LEFT HEART CATHETERIZATION WITH CORONARY ANGIOGRAM N/A 07/28/2011   Procedure: LEFT HEART CATHETERIZATION WITH CORONARY ANGIOGRAM;  Surgeon: Lorretta Harp, MD;  Location: [facility] C[facility];  Service: Cardiovascular;  Laterality: N/A;  . LEFT HEART CATHETERIZATION WITH CORONARY ANGIOGRAM N/A 12/31/2013   Procedure: LEFT HEART CATHETERIZATION WITH CORONARY ANGIOGRAM;  Surgeon: Wellington Hampshire, MD;  Location: [facility] CATH [facility];  Service: Cardiovascular;  Laterality: N/A;    Social History CHRISTYNA LETENDRE  reports that she has never smoked. She has never used smokeless tobacco. She reports that she does not drink alcohol or use drugs.  family history includes Asthma in her brother; Atrial fibrillation in her sister; Cancer in her daughter; Colon polyps in her daughter; Diabetes in her daughter; Heart attack in her brother and father; Heart disease in her brother; Hyperlipidemia in her sister; Macular degeneration in her sister; Osteoporosis in her sister; Other in her father and mother; Stroke in her mother, sister, and sister; Uterine cancer in her sister.  Allergies  Allergen Reactions  . Bactrim [  Sulfamethoxazole-Trimethoprim]     Itchy rash  . Actos [Pioglitazone] Swelling  . Adhesive [Tape] Other (See Comments)    Causes sores  . Contrast Media [Iodinated Diagnostic Agents] Other (See Comments)    Headache (no action/pre-med required)  . Latex Rash  . Lipitor [Atorvastatin] Other (See Comments)    myalgia       PHYSICAL EXAMINATION: Vital signs: BP (!) 172/70 (BP Location: Left Arm, Patient Position: Sitting, Cuff Size: Normal)   Pulse 64 Comment: irregular  Ht 5' 2.25" (1.581 m)   Wt 167 lb 2 oz (75.8 kg)   BMI 30.32 kg/m   Constitutional: generally well-appearing, no acute distress Psychiatric: alert and oriented x3, cooperative Eyes: extraocular movements intact, anicteric, conjunctiva pink Mouth: oral pharynx moist, no lesions Neck:  supple no lymphadenopathy Cardiovascular: heart regular rate and rhythm, no murmur Lungs: clear to auscultation bilaterally Abdomen: soft,Obese, nontender, nondistended, no obvious ascites, no peritoneal signs, normal bowel sounds, no organomegaly Rectal:Omitted. Negative rectal exam in July Extremities: no clubbing, cyanosis. Trace lower extremity edema bilaterally Skin: no lesions on visible extremities Neuro: No focal deficits. Cranial nerves intact. No asterixis.    ASSESSMENT:  #1. Deficiency anemia with intermittent Hemoccult-positive stools. Previous colonoscopies, virtual colonoscopy, and upper endoscopy as described. No culprit lesion. Suspect chronic GI blood loss from small bowel AVMs, other small bowel lesion, or nonspecific mucosal oozing as is seen in some patients on chronic anticoagulation therapy. #2. Intermittent loose stools. May be her microscopic colitis. #3. Vague abdominal discomfort. Chronic #4. Multiple significant medical problems #5. GERD, complicated by peptic stricture. Asymptomatic on PPI   PLAN:  #1. Continue iron replacement, orally, twice daily #2. Repeat CBC and ferritin level in 2 weeks. The patient would like to have this done at Dr. Tawanna Sat office for her traveling convenience. However, these labs should return to my inbox for review #3. Schedule capsule endoscopy. The patient is high risk. This will need to be done as a hospital inpatient under same-day observation status given her ICD. I reviewed this with her. Hold iron 3 days prior to assure good bowel prep. Continue Eliquis throughout. This has been set up for one week from today at [place] #4. May need hematology assessment if she does not respond oral iron #5. Consider a course of budesonide for diarrhea given prior history of microscopic colitis #6. Ongoing general medical care with Dr. Selinda Eon other specialists  45 minutes spent face-to-face with the patient. Greater than 50% a time use  for counseling and coordination of care regarding her iron deficiency anemia and its assessment/management

## 2016-10-23 NOTE — Patient Instructions (Signed)
Your physician has requested that you go for the following lab work in two weeks:  CBC Ferritin  CAPSULE ENDOSCOPY PATIENT INSTRUCTION [place] 02-20-1931 [id number]   1. 10/23/2016, Seven (7) days prior to capsule endoscopy stop taking iron supplements and carafate.  2. 10/27/2016, Two (2) days prior to capsule endoscopy stop taking aspirin or any arthritis drugs.  3. 10/29/2016, Day before capsule endoscopy purchase a 238 gram bottle of Miralax from the laxative section of your drug store, and a 32 oz. bottle of Gatorade (no red).    4. 10/29/2016 One (1) day prior to capsule endoscopy: a) Stop smoking. b) Eat a regular diet until 12:00 Noon. c) After 12:00 Noon take only the following: Black coffee  Jell-O (no fruit or red Jell-o) Water   Bouillon (chicken or beef) 7-Up   Cranberry Juice Tea   Kool-Aid Popsicle (not red) Sprite   Coke Ginger Ale  Pepsi Mountain Dew Gatorade d) At 6:00 pm the evening before your appointment, drink 7 capfuls (105 grams) of Miralax with 32 oz. Gatorade. Drink 8 oz every 15 minutes until gone. e) Nothing to eat or drink after midnight except medications with a sip of water.  5. 10/30/2016 Day of capsule endoscopy: Wear loose two-piece clothing to your exam. No medications for 2 hours prior to your test.  6. Please arrive at [facility]  1st floor admitting by 7:45am on: 10/30/2016.   For any questions: Call [facility] at [phone] and ask to speak with one of the capsule endoscopy nurses.    The above instructions have been reviewed and explained to me by________________   Patient signature:_________________________________________     Date:________________   Per Dr. Henrene Pastor, Stop your iron for 5 days prior to the procedure but stay on your Eliquis

## 2016-10-25 ENCOUNTER — Ambulatory Visit (INDEPENDENT_AMBULATORY_CARE_PROVIDER_SITE_OTHER): Payer: Medicare Other | Admitting: *Deleted

## 2016-10-25 DIAGNOSIS — I429 Cardiomyopathy, unspecified: Secondary | ICD-10-CM

## 2016-10-25 NOTE — Progress Notes (Signed)
Remote ICD transmission.   

## 2016-10-27 ENCOUNTER — Encounter: Payer: Self-pay | Admitting: Cardiology

## 2016-10-30 ENCOUNTER — Observation Stay (HOSPITAL_COMMUNITY)
Admission: RE | Admit: 2016-10-30 | Discharge: 2016-10-30 | Disposition: A | Discharge status: Home / Self Care | Payer: Medicare Other | Source: Ambulatory Visit | Attending: Gastroenterology | Admitting: Gastroenterology

## 2016-10-30 ENCOUNTER — Encounter (HOSPITAL_COMMUNITY)
Admission: RE | Disposition: A | Discharge status: Home / Self Care | Payer: Self-pay | Source: Ambulatory Visit | Attending: Gastroenterology

## 2016-10-30 ENCOUNTER — Encounter (HOSPITAL_COMMUNITY): Payer: Self-pay | Admitting: *Deleted

## 2016-10-30 DIAGNOSIS — Z95810 Presence of automatic (implantable) cardiac defibrillator: Secondary | ICD-10-CM | POA: Insufficient documentation

## 2016-10-30 DIAGNOSIS — K219 Gastro-esophageal reflux disease without esophagitis: Secondary | ICD-10-CM | POA: Insufficient documentation

## 2016-10-30 DIAGNOSIS — M1990 Unspecified osteoarthritis, unspecified site: Secondary | ICD-10-CM | POA: Diagnosis not present

## 2016-10-30 DIAGNOSIS — D509 Iron deficiency anemia, unspecified: Principal | ICD-10-CM | POA: Diagnosis present

## 2016-10-30 DIAGNOSIS — Z79899 Other long term (current) drug therapy: Secondary | ICD-10-CM | POA: Diagnosis not present

## 2016-10-30 DIAGNOSIS — G8929 Other chronic pain: Secondary | ICD-10-CM | POA: Diagnosis not present

## 2016-10-30 DIAGNOSIS — I129 Hypertensive chronic kidney disease with stage 1 through stage 4 chronic kidney disease, or unspecified chronic kidney disease: Secondary | ICD-10-CM | POA: Diagnosis not present

## 2016-10-30 DIAGNOSIS — Z86718 Personal history of other venous thrombosis and embolism: Secondary | ICD-10-CM | POA: Diagnosis not present

## 2016-10-30 DIAGNOSIS — I4891 Unspecified atrial fibrillation: Secondary | ICD-10-CM | POA: Diagnosis not present

## 2016-10-30 DIAGNOSIS — Z955 Presence of coronary angioplasty implant and graft: Secondary | ICD-10-CM | POA: Insufficient documentation

## 2016-10-30 DIAGNOSIS — F419 Anxiety disorder, unspecified: Secondary | ICD-10-CM | POA: Diagnosis not present

## 2016-10-30 DIAGNOSIS — Z91040 Latex allergy status: Secondary | ICD-10-CM | POA: Diagnosis not present

## 2016-10-30 DIAGNOSIS — N183 Chronic kidney disease, stage 3 (moderate): Secondary | ICD-10-CM | POA: Diagnosis not present

## 2016-10-30 DIAGNOSIS — Z7901 Long term (current) use of anticoagulants: Secondary | ICD-10-CM | POA: Insufficient documentation

## 2016-10-30 DIAGNOSIS — E1151 Type 2 diabetes mellitus with diabetic peripheral angiopathy without gangrene: Secondary | ICD-10-CM | POA: Diagnosis not present

## 2016-10-30 DIAGNOSIS — E1122 Type 2 diabetes mellitus with diabetic chronic kidney disease: Secondary | ICD-10-CM | POA: Diagnosis not present

## 2016-10-30 DIAGNOSIS — I2510 Atherosclerotic heart disease of native coronary artery without angina pectoris: Secondary | ICD-10-CM | POA: Diagnosis not present

## 2016-10-30 DIAGNOSIS — Z888 Allergy status to other drugs, medicaments and biological substances status: Secondary | ICD-10-CM | POA: Insufficient documentation

## 2016-10-30 DIAGNOSIS — I252 Old myocardial infarction: Secondary | ICD-10-CM | POA: Diagnosis not present

## 2016-10-30 DIAGNOSIS — R195 Other fecal abnormalities: Secondary | ICD-10-CM

## 2016-10-30 HISTORY — PX: GIVENS CAPSULE STUDY: SHX5432

## 2016-10-30 SURGERY — IMAGING PROCEDURE, GI TRACT, INTRALUMINAL, VIA CAPSULE

## 2016-10-30 SURGICAL SUPPLY — 1 items: TOWEL COTTON PACK 4EA (MISCELLANEOUS) ×6 IMPLANT

## 2016-10-30 NOTE — H&P (Signed)
[facility] Admission Note   Primary Care Physician:  Chipper Herb, MD Primary Gastroenterologist:  Dr. Henrene Pastor    HPI: [name] is a 83 y.o. female.  Multiple medical problems.  She takes Eliquis for hx DVT,  paroxysmal atrial fibrillation. ICD placed after cardiac arrest. Status post coronary artery stenting.   Referred by her cardiologist, Dr Sallyanne Kuster, for investigation of iron deficiency anemia. Due to multiple comorbidities, she underwent virtual colonoscopy 06/2014 which was unremarkable. EGD performed 07/18/16 for reported dark stools and FOBT positive stool.   Study was normal. Fecal occult blood testing has been performed in April, June and July of this year. In June and July she was FOBT positive.She has chronically dark stools due to twice daily oral iron therapy.  She has long-standing, stable intermittent vague abdominal pain, intermittent diarrheaand the dark stools. With anemia she had progressive fatigue.  Arrangements were made for patient to undergo capsule endoscopy and this was set up for today. Because of her cardiac defibrillator, she requires telemetry monitor during the course of the capsule study and therefore is admitted for observation.  Patient has held her iron for the last few days but she has continued Eliquis as instructed.   Patient has fairly frequent of a heavy feeling in her chest which is not new. She denies shortness of breath. She denies dizziness. No abdominal pain in the last few days. No nausea or vomiting. Appetite is good.  Past Medical History:  Diagnosis Date  . AICD (automatic cardioverter/defibrillator) present   . Allergy    SESONAL  . Anxiety   . ARTHRITIS   . Arthritis   . Atrial fibrillation ([place])   . CAD (coronary artery disease)    a. history of cardiac arrest 1993;  b. s/p LAD/LCX stenting in 2003;  c. 07/2011 Cath: LM nl, LAD patent stent, D1 80ost, LCX patent stent, RCA  min irregs;  d. 04/2012 MV: EF 66%, no ishcemia;  e. 12/2013 Echo: Ef 55%, no rwma, Gr 1 DD, triv AI/MR, mildly dil LA;  f. 12/2013 Lexi MV: intermediate risk - apical ischemia and inf/infsept fixed defect, ? artifactual.  . CAD in native artery 12/31/2013   Previous stents to LAD and Ramus patent on cath today 12/31/13 also There is severe disease in the ostial first diagonal which is unchanged from most recent cardiac catheterization. The right coronary artery could not be engaged selectively but nonselective angiography showed no significant disease in the proximal and midsegment.     . Cataract    DENIES  . Chronic back pain   . Chronic sinus bradycardia   . CKD (chronic kidney disease), stage III ([place])   . Clotting disorder (HCC)    DVT  . Diabetes mellitus without complication ([place])    DENIES  . Diverticulosis of colon (without mention of hemorrhage) 2007   Colonoscopy   . Esophageal stricture    a. 2012 s/p dil.  . Esophagitis, unspecified    a. 2012 EGD  . EXTERNAL HEMORRHOIDS   . GERD (gastroesophageal reflux disease)    omeprazole  . H/O hiatal hernia   . Hiatal hernia    a. 2012 EGD.  Marland Kitchen History of DVT in the past, not on Coumadin now    left  leg  . HYPERCHOLESTEROLEMIA   . Hypertension   . ICD (implantable cardiac defibrillator) in place    a. s/p initial ICD in 1993 in setting of cardiac arrest;  b. 01/2007 gen change:  Guidant T135 Vitality DS VR single lead ICD.  . Macular degeneration    gets injecton in eye every 5 weeks- last injection - 05/03/2013   . Myocardial infarction ([place]) 1993  . Nephrolithiasis, just saw Dr [name]   . Orthostatic hypotension   . Peripheral vascular disease ([place])    ???  . Unspecified gastritis and gastroduodenitis without mention of hemorrhage    a. 2003 EGD->not noted on 2012 EGD.    Past Surgical History:  Procedure Laterality Date  . CARDIAC CATHETERIZATION    . CARDIAC CATHETERIZATION  01/01/15   patent stents -there is  severe disease in ostial 1st diag but without change.   Marland Kitchen CARDIAC DEFIBRILLATOR PLACEMENT  02/05/2007   Guidant  . CARDIOVASCULAR STRESS TEST  12/30/2013   abnormal  . COLONOSCOPY    . coronary stents     . CYSTOSCOPY WITH URETEROSCOPY Right 05/20/2013   Procedure: CYSTOSCOPY, RIGHT URETEROSCOPY STONE EXTRACTION, Insertion of right DOUBLE J STENT ;  Surgeon: Irine Seal, MD;  Location: [facility];  Service: Urology;  Laterality: Right;  . CYSTOSCOPY WITH URETEROSCOPY AND STENT PLACEMENT N/A 06/03/2013   Procedure: SECOND LOOK CYSTOSCOPY WITH URETEROSCOPY  HOLMIUM LASER LITHO AND STONE EXTRACTION Sammie Bench ;  Surgeon: Malka So, MD;  Location: [facility];  Service: Urology;  Laterality: N/A;  . HEMORROIDECTOMY  80's  . HOLMIUM LASER APPLICATION Right 02/22/7822   Procedure: HOLMIUM LASER APPLICATION;  Surgeon: Irine Seal, MD;  Location: [facility];  Service: Urology;  Laterality: Right;  . HYSTEROSCOPY W/D&C  09/02/2010   Procedure: DILATATION AND CURETTAGE (D&C) /HYSTEROSCOPY;  Surgeon: Margarette Asal;  Location: [facility];  Service: Gynecology;  Laterality: N/A;  Dilation and Curettage with Hysteroscopy and Polypectomy  . IMPLANTABLE CARDIOVERTER DEFIBRILLATOR (ICD) GENERATOR CHANGE N/A 02/10/2014   Procedure: ICD GENERATOR CHANGE;  Surgeon: Sanda Klein, MD;  Location: [facility];  Service: Cardiovascular;  Laterality: N/A;  . LEFT HEART CATHETERIZATION WITH CORONARY ANGIOGRAM N/A 07/28/2011   Procedure: LEFT HEART CATHETERIZATION WITH CORONARY ANGIOGRAM;  Surgeon: Lorretta Harp, MD;  Location[facility];  Service: Cardiovascular;  Laterality: N/A;  . LEFT HEART CATHETERIZATION WITH CORONARY ANGIOGRAM N/A 12/31/2013   Procedure: LEFT HEART CATHETERIZATION WITH CORONARY ANGIOGRAM;  Surgeon: Wellington Hampshire, MD;  Location: [facility] CATH [facility];  Service: Cardiovascular;  Laterality: N/A;    Prior to Admission medications   Medication Sig Start Date End Date Taking? Authorizing Provider  apixaban (ELIQUIS) 5 MG  TABS tablet Take 1 tablet (5 mg total) by mouth 2 (two) times daily. 05/17/16  Yes Croitoru, Mihai, MD  Calcium Citrate-Vitamin D (CITRACAL + D PO) Take 1 tablet by mouth daily.   Yes [provider]  Cholecalciferol (VITAMIN D) 1000 UNITS capsule Take 2,000 Units by mouth daily.    Yes [provider]  ferrous sulfate 325 (65 FE) MG tablet Take 1 tablet (325 mg total) by mouth 2 (two) times daily with a meal. 10/09/16  Yes Croitoru, Mihai, MD  gabapentin (NEURONTIN) 100 MG capsule Take 1 capsule (100 mg total) by mouth at bedtime. 08/25/16  Yes Chipper Herb, MD  lisinopril (PRINIVIL,ZESTRIL) 20 MG tablet TAKE  (1)  TABLET TWICE A DAY. 08/14/16  Yes Croitoru, Mihai, MD  methocarbamol (ROBAXIN) 500 MG tablet Take 1 tablet (500 mg total) by mouth 2 (two) times daily. 06/11/16  Yes Francine Graven, DO  metoprolol tartrate (LOPRESSOR) 50 MG tablet Take 50 mg by mouth 2 (two) times daily.  Yes [provider]  Multiple Vitamins-Minerals (CENTRUM SILVER ULTRA WOMENS PO) Take 1 tablet by mouth daily.   Yes [provider]  nitroGLYCERIN (NITROSTAT) 0.4 MG SL tablet Place 1 tablet (0.4 mg total) under the tongue every 5 (five) minutes as needed for chest pain. 12/31/13  Yes Isaiah Serge, NP  pantoprazole (PROTONIX) 40 MG tablet TKAE 1 TABLET 2 TIMES A DAY 11/22/15  Yes Croitoru, Mihai, MD  PARoxetine (PAXIL) 20 MG tablet Take 1 tablet (20 mg total) by mouth every morning. 08/15/16  Yes Chipper Herb, MD  rosuvastatin (CRESTOR) 20 MG tablet Take 1 tablet (20 mg total) by mouth daily. Patient taking differently: Take 10 mg by mouth daily.  11/08/15  Yes Croitoru, Mihai, MD  glucose blood (ACCU-CHEK AVIVA PLUS) test strip Use to check BG once daily.  DX: E11.9 09/14/14   Chipper Herb, MD  Lancet Devices [facility]) lancets Use to check BG once daily.  Dx:  E11.9 09/14/14   Chipper Herb, MD  ondansetron (ZOFRAN ODT) 4 MG disintegrating tablet 4mg  ODT q4 hours  prn nausea/vomit 04/25/16   Muthersbaugh, Jarrett Soho, PA-C  oxyCODONE-acetaminophen (PERCOCET/ROXICET) 5-325 MG tablet 1 or 2 tabs PO q12h prn pain 06/11/16   Francine Graven, DO  torsemide (DEMADEX) 20 MG tablet Take 20 mg by mouth daily as needed (edema).    [provider]  triamcinolone (KENALOG) 0.025 % ointment Apply 1 application topically 2 (two) times daily. On lower back rash 08/19/16   Eustaquio Maize, MD    No current facility-administered medications for this encounter.     Allergies as of 10/23/2016 - Review Complete 10/23/2016  Allergen Reaction Noted  . Bactrim [sulfamethoxazole-trimethoprim]  08/19/2016  . Actos [pioglitazone] Swelling 07/31/2012  . Adhesive [tape] Other (See Comments) 02/20/2014  . Contrast media [iodinated diagnostic agents] Other (See Comments) 07/25/2011  . Latex Rash 05/29/2013  . Lipitor [atorvastatin] Other (See Comments) 07/31/2012    Family History  Problem Relation Age of Onset  . Stroke Mother   . Other Mother        brain tumor  . Other Father        MI  . Heart attack Father   . Stroke Sister   . Macular degeneration Sister   . Diabetes Daughter   . Cancer Daughter        ovarian  . Colon polyps Daughter   . Atrial fibrillation Sister   . Hyperlipidemia Sister   . Osteoporosis Sister   . Stroke Sister   . Uterine cancer Sister   . Heart attack Brother   . Heart disease Brother   . Asthma Brother     Social History   Social History  . Marital status: Widowed    Spouse name: N/A  . Number of children: N/A  . Years of education: N/A   Occupational History  . Not on file.   Social History Main Topics  . Smoking status: Never Smoker  . Smokeless tobacco: Never Used  . Alcohol use No  . Drug use: No  . Sexual activity: No   Other Topics Concern  . Not on file   Social History Narrative  . No narrative on file    REVIEW OF SYSTEMS: Constitutional:  periodic fatigue. ENT:  No nasal discharge or  bleeding Pulm:  Some exertional dyspnea when she walks longer distances. CV:  eavy feeling in her chest which is not new GU:  No blood in her urine. No  dysuria, no incontinence. GI:  No dysphagia. No nausea or vomiting. Heme:  No excessive bleeding or bruising..    Transfusions:  None. Reviewed Epic and found no previous transfusions dating back to 2003. Neuro:  No headaches. No peripheral numbness or tingling. No loss of limb function. No difficulty swallowing. Derm:  No rashes or sores. No pruritus. Endocrine:  No sweats, chills. No excessive thirst or water consumption. Immunization:  I did not inquire as to her current vaccination status. Travel:  She's traveled locally only within a couple of hours of home at the most in the last year.   PHYSICAL EXAM:  Temp:  [98.3 F (36.8 C)] 98.3 F (36.8 C) (10/15 0900) Pulse Rate:  [59-60] 60 (10/15 1125) Resp:  [15-21] 21 (10/15 1125) BP: (190-205)/(74-85) 199/74 (10/15 1125) SpO2:  [99 %-100 %] 99 % (10/15 1125)    General:  Pleasant, somewhat pale, comfortable, mildly chronically ill looking. Alert Ears:  Not hard of hearing. Mouth:  ongue midline. Oropharynx moist, pink, clear. Good dental repair.  Neck: no JVD, no masses, no thyromegaly. Lungs:  No dyspnea. No cough. Lungs clear bilaterally.   Heart:  Sinus rhythm in the 60s on the monitor.  Abdomen:  Soft. Not tender or distended. Active bowel sounds. No organomegaly, bruits, hernias.    Rectal:  Deferred, not performed.  Msk:  No joint erythema, swelling or significant deformities.   Pulses:  Pedal pulses full bilaterally. Extremities:  No CCE.  Neurologic: fully alert. Oriented x 3. Appropriate.  Moves all 4 limbs, strength not tested. No tremor. Skin:  No telangiectasia, rash, sores or suspicious lesions. Psych:  Calm, pleasant, cooperative. Fluid speech.    Intake/Output from previous day: No intake/output data recorded. Intake/Output this shift: No intake/output data  recorded.  LAB RESULTS: None obtained.    RADIOLOGY STUDIES: No results found.   IMPRESSION:   *  FOBT + anemia.  EGD in 07/2016 and virtual colonoscopy in 06/2014 unrevealing as to source. Patient now undergoing capsule endoscopy study to complete the workup.  *  History paroxysmal atrial fibrillation, currently in sinus rhythm though earlier this morning she was in and out of atrial fibrillation.  *  Chronic Eliquis for history DVT and paroxysmal atrial fibrillation.  *  Hypertension. Systolic blood pressure readings are elevated.  *  S/p single chamber ICD 1993, new ventricular leae 2009, new generator 2016.    *  CAD.  Status post cardiac stents to LAD, left CFX in 2003. Patent stents in 2006, 2013 and late 2015.   PLAN:  *  Undergo capsule endoscopy today. This should finish up on 3:30 - 4 PM today after which time she can go home.  *  Since the patient has an appointment coming up for 10/17 with Dr Caryl Comes, will refer mgt of her htn to him.     LOS: 0 days   Azucena Freed  10/30/2016, 1:37 PM Pager [phone]

## 2016-10-30 NOTE — Progress Notes (Signed)
Patient remained in endoscopy with continuous cardiac monitoring for the duration of small bowel study, it has concluded at this time. Ppatient remained in sinus rhythm for the duration of study, no cardiac events noted during monitoring time. Patient d/c to home. Denies any pain, issue or concern at this time.

## 2016-10-30 NOTE — Progress Notes (Signed)
Patient ingested givens capsule for small bowel endoscopy 10/30/2016 at 0830. Patient will need to be admitted for continuous cardiac monitoring during 8 hour study due to patient having ICD. Patient will remain on continuous cardiac monitoring in endoscopy until observation bed is available. Patient is in sinus rhythm at this time

## 2016-10-31 NOTE — Discharge Summary (Signed)
Discharge Summary:  Name: [name] MRN: [id number] DOB: Jan 04, 1932 83 y.o. PCP:  Chipper Herb, MD  Date of Admission: 10/30/2016  7:08 AM Date of Discharge: 10/31/2016 Attending Physician: No att. providers found  Admitting Dignosis: Active Problems:   Iron deficiency anemia   Discharge Diagnosis: Active Problems:   Iron deficiency anemia   Hypertension   Previous Medical/Surgical history Past Medical History:  Diagnosis Date  . AICD (automatic cardioverter/defibrillator) present   . Allergy    SESONAL  . Anxiety   . ARTHRITIS   . Arthritis   . Atrial fibrillation ([place])   . CAD (coronary artery disease)    a. history of cardiac arrest 1993;  b. s/p LAD/LCX stenting in 2003;  c. 07/2011 Cath: LM nl, LAD patent stent, D1 80ost, LCX patent stent, RCA min irregs;  d. 04/2012 MV: EF 66%, no ishcemia;  e. 12/2013 Echo: Ef 55%, no rwma, Gr 1 DD, triv AI/MR, mildly dil LA;  f. 12/2013 Lexi MV: intermediate risk - apical ischemia and inf/infsept fixed defect, ? artifactual.  . CAD in native artery 12/31/2013   Previous stents to LAD and Ramus patent on cath today 12/31/13 also There is severe disease in the ostial first diagonal which is unchanged from most recent cardiac catheterization. The right coronary artery could not be engaged selectively but nonselective angiography showed no significant disease in the proximal and midsegment.     . Cataract    DENIES  . Chronic back pain   . Chronic sinus bradycardia   . CKD (chronic kidney disease), stage III ([place])   . Clotting disorder (HCC)    DVT  . Diabetes mellitus without complication ([place])    DENIES  . Diverticulosis of colon (without mention of hemorrhage) 2007   Colonoscopy   . Esophageal stricture    a. 2012 s/p dil.  .  Esophagitis, unspecified    a. 2012 EGD  . EXTERNAL HEMORRHOIDS   . GERD (gastroesophageal reflux disease)    omeprazole  . H/O hiatal hernia   . Hiatal hernia    a. 2012 EGD.  Marland Kitchen History of DVT in the past, not on Coumadin now    left  leg  . HYPERCHOLESTEROLEMIA   . Hypertension   . ICD (implantable cardiac defibrillator) in place    a. s/p initial ICD in 1993 in setting of cardiac arrest;  b. 01/2007 gen change: Guidant T135 Vitality DS VR single lead ICD.  . Macular degeneration    gets injecton in eye every 5 weeks- last injection - 05/03/2013   . Myocardial infarction ([place]) 1993  . Nephrolithiasis, just saw Dr [name]   . Orthostatic hypotension   . Peripheral vascular disease ([place])    ???  . Unspecified gastritis and gastroduodenitis without mention of hemorrhage    a. 2003 EGD->not noted on 2012 EGD.   Past Surgical History:  Procedure Laterality Date  . CARDIAC CATHETERIZATION    . CARDIAC CATHETERIZATION  01/01/15   patent stents -there is severe disease in ostial 1st diag but without change.   Marland Kitchen CARDIAC DEFIBRILLATOR  PLACEMENT  02/05/2007   Guidant  . CARDIOVASCULAR STRESS TEST  12/30/2013   abnormal  . COLONOSCOPY    . coronary stents     . CYSTOSCOPY WITH URETEROSCOPY Right 05/20/2013   Procedure: CYSTOSCOPY, RIGHT URETEROSCOPY STONE EXTRACTION, Insertion of right DOUBLE J STENT ;  Surgeon: Irine Seal, MD;  Location: [facility];  Service: Urology;  Laterality: Right;  . CYSTOSCOPY WITH URETEROSCOPY AND STENT PLACEMENT N/A 06/03/2013   Procedure: SECOND LOOK CYSTOSCOPY WITH URETEROSCOPY  HOLMIUM LASER LITHO AND STONE EXTRACTION Sammie Bench ;  Surgeon: Malka So, MD;  Location: [facility];  Service: Urology;  Laterality: N/A;  . GIVENS CAPSULE STUDY N/A 10/30/2016   Procedure: GIVENS CAPSULE STUDY;  Surgeon: Irene Shipper, MD;  Location: [facility];  Service: Endoscopy;  Laterality: N/A;  NEEDS TO BE ADMITTED FOR OBSERVATION PT. HAS DEFIB  . HEMORROIDECTOMY  80's  .  HOLMIUM LASER APPLICATION Right 04/17/6832   Procedure: HOLMIUM LASER APPLICATION;  Surgeon: Irine Seal, MD;  [facility];  Service: Urology;  Laterality: Right;  . HYSTEROSCOPY W/D&C  09/02/2010   Procedure: DILATATION AND CURETTAGE (D&C) /HYSTEROSCOPY;  Surgeon: Margarette Asal;  Location: [facility];  Service: Gynecology;  Laterality: N/A;  Dilation and Curettage with Hysteroscopy and Polypectomy  . IMPLANTABLE CARDIOVERTER DEFIBRILLATOR (ICD) GENERATOR CHANGE N/A 02/10/2014   Procedure: ICD GENERATOR CHANGE;  Surgeon: Sanda Klein, MD;  Location: [facility] CATH LAB;  Service: Cardiovascular;  Laterality: N/A;  . LEFT HEART CATHETERIZATION WITH CORONARY ANGIOGRAM N/A 07/28/2011   Procedure: LEFT HEART CATHETERIZATION WITH CORONARY ANGIOGRAM;  Surgeon: Lorretta Harp, MD;  Location: [facility] CATH LAB;  Service: Cardiovascular;  Laterality: N/A;  . LEFT HEART CATHETERIZATION WITH CORONARY ANGIOGRAM N/A 12/31/2013   Procedure: LEFT HEART CATHETERIZATION WITH CORONARY ANGIOGRAM;  Surgeon: Wellington Hampshire, MD;  Location: [facility] CATH LAB;  Service: Cardiovascular;  Laterality: N/A;    Brief History: Pt is 85.  Takes Eliquis for hx DVT, paroxysmal A fib.  Previous cardiac stenting.  ICD placed after cardiac arrest.   Dr Henrene Pastor has been investigating her iron deficiency anemia.  Due to multiple comorbidities, she underwent virtual colonoscopy 06/2014 which was unremarkable. EGD performed 07/18/16 for reported dark and FOBT +stools was normal. Fecal occult blood testing positive in June and July 2018, negative in April 2018.  Capsule endoscopy set up for 10/15.  Needed to be inpt due to presence of the ICD.    Hospital Course Pt swallowed the capsule and testing at 0830 was completed by 1630 when she was discharged home.  During the observation stay in the endo unit her BPs were elevated, particularly her systolic BPs.  Initial reading of 205/82.  She had taken all her BP, cardiac meds PTA.   Repeat readings wer in the  190s/70s-80s.  She felt well.  She often has a heavy feeling in her chest which is not new, but was not having this during the brief admission.  No dyspnea.  No dizzy/lightheaded feeling.  Pt d/w Dr Ardis Hughs.  She had appt for 10/17 with Dr Caryl Comes of cardiology who could address pt's systolic hypertension, if persistent, during the visit.     Results of capsule endoscopy will be forthcoming.  Needs to be read.  Dr Henrene Pastor will be in touch with pt and update her on findings of the capsule study.    Wt Readings from Last 1 Encounters:  10/23/16 75.8 kg (167 lb 2 oz)    Discharge Medications: Allergies as of  10/30/2016      Reactions   Bactrim [sulfamethoxazole-trimethoprim]    Itchy rash   Actos [pioglitazone] Swelling   Adhesive [tape] Other (See Comments)   Causes sores   Contrast Media [iodinated Diagnostic Agents] Other (See Comments)   Headache (no action/pre-med required)   Latex Rash   Lipitor [atorvastatin] Other (See Comments)   myalgia      Medication List    TAKE these medications   accu-chek softclix lancets Use to check BG once daily.  Dx:  E11.9   apixaban 5 MG Tabs tablet Commonly known as:  ELIQUIS Take 1 tablet (5 mg total) by mouth 2 (two) times daily.   CENTRUM SILVER ULTRA WOMENS PO Take 1 tablet by mouth daily.   CITRACAL + D PO Take 1 tablet by mouth daily.   ferrous sulfate 325 (65 FE) MG tablet Take 1 tablet (325 mg total) by mouth 2 (two) times daily with a meal.   gabapentin 100 MG capsule Commonly known as:  NEURONTIN Take 1 capsule (100 mg total) by mouth at bedtime.   glucose blood test strip Commonly known as:  ACCU-CHEK AVIVA PLUS Use to check BG once daily.  DX: E11.9   lisinopril 20 MG tablet Commonly known as:  PRINIVIL,ZESTRIL TAKE  (1)  TABLET TWICE A DAY.   methocarbamol 500 MG tablet Commonly known as:  ROBAXIN Take 1 tablet (500 mg total) by mouth 2 (two) times daily.   metoprolol tartrate 50 MG tablet Commonly known as:   LOPRESSOR Take 50 mg by mouth 2 (two) times daily.   nitroGLYCERIN 0.4 MG SL tablet Commonly known as:  NITROSTAT Place 1 tablet (0.4 mg total) under the tongue every 5 (five) minutes as needed for chest pain.   ondansetron 4 MG disintegrating tablet Commonly known as:  ZOFRAN ODT 4mg  ODT q4 hours prn nausea/vomit   oxyCODONE-acetaminophen 5-325 MG tablet Commonly known as:  PERCOCET/ROXICET 1 or 2 tabs PO q12h prn pain   pantoprazole 40 MG tablet Commonly known as:  PROTONIX TKAE 1 TABLET 2 TIMES A DAY   PARoxetine 20 MG tablet Commonly known as:  PAXIL Take 1 tablet (20 mg total) by mouth every morning.   rosuvastatin 20 MG tablet Commonly known as:  CRESTOR Take 1 tablet (20 mg total) by mouth daily. What changed:  how much to take   torsemide 20 MG tablet Commonly known as:  DEMADEX Take 20 mg by mouth daily as needed (edema).   triamcinolone 0.025 % ointment Commonly known as:  KENALOG Apply 1 application topically 2 (two) times daily. On lower back rash   Vitamin D 1000 units capsule Take 2,000 Units by mouth daily.       Consultations:   Procedures Performed:  *  Capsule endoscopy.     Discharge Labs:  No labs obtained   Disposition and follow-up:   Ms.Hilarie J Collard was discharged from  in stable condition.   Follow up Appointments:    Dr Virl Axe on 11/01/16 Dr Redge Gainer on 11/17/16   Discharge Instructions    Diet - low sodium heart healthy    Complete by:  As directed        Increase activity slowly    Complete by:  As directed              Time Spent on discharge:  30 minutes   Signed: Mliss Sax Santina Evans  [phone] 10/31/2016, 3:13 PM

## 2016-11-01 ENCOUNTER — Encounter: Payer: Self-pay | Admitting: Internal Medicine

## 2016-11-01 ENCOUNTER — Ambulatory Visit (INDEPENDENT_AMBULATORY_CARE_PROVIDER_SITE_OTHER): Payer: Medicare Other | Admitting: Internal Medicine

## 2016-11-01 VITALS — BP 160/80 | HR 62 | Resp 16 | Ht 62.0 in | Wt 166.0 lb

## 2016-11-01 DIAGNOSIS — I429 Cardiomyopathy, unspecified: Secondary | ICD-10-CM

## 2016-11-01 DIAGNOSIS — Z8679 Personal history of other diseases of the circulatory system: Secondary | ICD-10-CM | POA: Insufficient documentation

## 2016-11-01 DIAGNOSIS — Z9229 Personal history of other drug therapy: Secondary | ICD-10-CM | POA: Diagnosis not present

## 2016-11-01 DIAGNOSIS — I480 Paroxysmal atrial fibrillation: Secondary | ICD-10-CM | POA: Diagnosis not present

## 2016-11-01 MED ORDER — AMLODIPINE BESYLATE 2.5 MG PO TABS: 2.5000 mg | ORAL_TABLET | Freq: Every day | ORAL | 3 refills | Status: DC

## 2016-11-01 NOTE — Progress Notes (Signed)
ELECTROPHYSIOLOGY CONSULT NOTE  Patient ID: [name], MRN: [id number], DOB/AGE: March 31, 1931 83 y.o. Admit date: (Not on file) Date of Consult: 11/01/2016  Primary Physician: Chipper Herb, MD Primary Cardiologist: Thermon Leyland [name] is a 83 y.o. female who is being seen today for the evaluation of Atrial fib at the request of MCr.   Chief Complaint: Atrial fib   HPI [name] is a 83 y.o. female  Who carries a diagnosis of  CPVT for which she was treated with disopyramide?? (I see a copy of an note from me in 2003 which describes syncope and polymorphic VT, and abnormal signal average ECG as well as wall motion abnormality in the context of normal coronary arteries thought secondary to spasm. Because of ongoing issues of ventricular arrhythmias she was treated initially with Quinaglute. She developed torsade de pointes on sotalol.   Antiarrhythmics Date Reason stopped  Quinidine    diarrhea  sotalol  Torsades de Pointes  Disopyramide  Side effects       She's had problems with recurrent atrial fibrillation which is quite symptomatic. She has weakness and fatigue shortness of breath with this. Anticoagulated with apixoban  Thromboembolic risk factors ( age  -66, HTN-1, DM-1, Vasc disease -1, Gender-1) for a CHADSVASc Score of 6   History of coronary artery disease with prior stenting with her most recent catheterization 2015 demonstrating patent stents and normal LV function.  Device History: ICD implanted 1993 for VT polymorphic ( chart describes CPVT)   Gen Change 2016 History of appropriate therapy:yes 2004  History of AAD therapy: Yes Date Lead Status   1993 HV Abandoned-abd  2007 HV Abandoned-abd  2009 HV active         Past Medical History:  Diagnosis Date  . AICD (automatic cardioverter/defibrillator) present   . Allergy    SESONAL  . Anxiety   . ARTHRITIS   . Arthritis   . Atrial fibrillation ([place])   . CAD (coronary artery disease)      a. history of cardiac arrest 1993;  b. s/p LAD/LCX stenting in 2003;  c. 07/2011 Cath: LM nl, LAD patent stent, D1 80ost, LCX patent stent, RCA min irregs;  d. 04/2012 MV: EF 66%, no ishcemia;  e. 12/2013 Echo: Ef 55%, no rwma, Gr 1 DD, triv AI/MR, mildly dil LA;  f. 12/2013 Lexi MV: intermediate risk - apical ischemia and inf/infsept fixed defect, ? artifactual.  . CAD in native artery 12/31/2013   Previous stents to LAD and Ramus patent on cath today 12/31/13 also There is severe disease in the ostial first diagonal which is unchanged from most recent cardiac catheterization. The right coronary artery could not be engaged selectively but nonselective angiography showed no significant disease in the proximal and midsegment.     . Cataract    DENIES  . Chronic back pain   . Chronic sinus bradycardia   . CKD (chronic kidney disease), stage III ([place])   . Clotting disorder (HCC)    DVT  . Diabetes mellitus without complication ([place])    DENIES  . Diverticulosis of colon (without mention of hemorrhage) 2007   Colonoscopy   . Esophageal stricture    a. 2012 s/p dil.  . Esophagitis, unspecified    a. 2012 EGD  . EXTERNAL HEMORRHOIDS   . GERD (gastroesophageal reflux disease)    omeprazole  . H/O hiatal hernia   . Hiatal hernia    a. 2012 EGD.  Marland Kitchen History of  DVT in the past, not on Coumadin now    left  leg  . HYPERCHOLESTEROLEMIA   . Hypertension   . ICD (implantable cardiac defibrillator) in place    a. s/p initial ICD in 1993 in setting of cardiac arrest;  b. 01/2007 gen change: Guidant T135 Vitality DS VR single lead ICD.  . Macular degeneration    gets injecton in eye every 5 weeks- last injection - 05/03/2013   . Myocardial infarction ([place]) 1993  . Nephrolithiasis, just saw Dr [name]   . Orthostatic hypotension   . Peripheral vascular disease ([place])    ???  . Unspecified gastritis and gastroduodenitis without mention of hemorrhage    a. 2003 EGD->not noted on 2012 EGD.       Surgical History:  Past Surgical History:  Procedure Laterality Date  . CARDIAC CATHETERIZATION    . CARDIAC CATHETERIZATION  01/01/15   patent stents -there is severe disease in ostial 1st diag but without change.   Marland Kitchen CARDIAC DEFIBRILLATOR PLACEMENT  02/05/2007   Guidant  . CARDIOVASCULAR STRESS TEST  12/30/2013   abnormal  . COLONOSCOPY    . coronary stents     . CYSTOSCOPY WITH URETEROSCOPY Right 05/20/2013   Procedure: CYSTOSCOPY, RIGHT URETEROSCOPY STONE EXTRACTION, Insertion of right DOUBLE J STENT ;  Surgeon: Irine Seal, MD;  Location: [facility];  Service: Urology;  Laterality: Right;  . CYSTOSCOPY WITH URETEROSCOPY AND STENT PLACEMENT N/A 06/03/2013   Procedure: SECOND LOOK CYSTOSCOPY WITH URETEROSCOPY  HOLMIUM LASER LITHO AND STONE EXTRACTION Sammie Bench ;  Surgeon: Malka So, MD;  Location: [facility];  Service: Urology;  Laterality: N/A;  . GIVENS CAPSULE STUDY N/A 10/30/2016   Procedure: GIVENS CAPSULE STUDY;  Surgeon: Irene Shipper, MD;  Location: [facility];  Service: Endoscopy;  Laterality: N/A;  NEEDS TO BE ADMITTED FOR OBSERVATION PT. HAS DEFIB  . HEMORROIDECTOMY  80's  . HOLMIUM LASER APPLICATION Right 6/0/1093   Procedure: HOLMIUM LASER APPLICATION;  Surgeon: Irine Seal, MD;  Location: [facility];  Service: Urology;  Laterality: Right;  . HYSTEROSCOPY W/D&C  09/02/2010   Procedure: DILATATION AND CURETTAGE (D&C) /HYSTEROSCOPY;  Surgeon: Margarette Asal;  Location: [facility];  Service: Gynecology;  Laterality: N/A;  Dilation and Curettage with Hysteroscopy and Polypectomy  . IMPLANTABLE CARDIOVERTER DEFIBRILLATOR (ICD) GENERATOR CHANGE N/A 02/10/2014   Procedure: ICD GENERATOR CHANGE;  Surgeon: Sanda , MD;  Location: [facility];  Service: Cardiovascular;  Laterality: N/A;  . LEFT HEART CATHETERIZATION WITH CORONARY ANGIOGRAM N/A 07/28/2011   Procedure: LEFT HEART CATHETERIZATION WITH CORONARY ANGIOGRAM;  Surgeon: Lorretta Harp, MD;  Location[facility];  Service:  Cardiovascular;  Laterality: N/A;  . LEFT HEART CATHETERIZATION WITH CORONARY ANGIOGRAM N/A 12/31/2013   Procedure: LEFT HEART CATHETERIZATION WITH CORONARY ANGIOGRAM;  Surgeon: Wellington Hampshire, MD;  Location: [facility];  Service: Cardiovascular;  Laterality: N/A;     Home Meds: Prior to Admission medications   Medication Sig Start Date End Date Taking? Authorizing Provider  apixaban (ELIQUIS) 5 MG TABS tablet Take 1 tablet (5 mg total) by mouth 2 (two) times daily. 05/17/16  Yes Croitoru, Mihai, MD  Calcium Citrate-Vitamin D (CITRACAL + D PO) Take 1 tablet by mouth daily.   Yes [provider]  Cholecalciferol (VITAMIN D) 1000 UNITS capsule Take 2,000 Units by mouth daily.    Yes [provider]  ferrous sulfate 325 (65 FE) MG tablet Take 1 tablet (325 mg total) by mouth 2 (  two) times daily with a meal. 10/09/16  Yes Croitoru, Mihai, MD  lisinopril (PRINIVIL,ZESTRIL) 20 MG tablet TAKE  (1)  TABLET TWICE A DAY. 08/14/16  Yes Croitoru, Mihai, MD  methocarbamol (ROBAXIN) 500 MG tablet Take 1 tablet (500 mg total) by mouth 2 (two) times daily. 06/11/16  Yes Francine Graven, DO  metoprolol tartrate (LOPRESSOR) 50 MG tablet Take 50 mg by mouth 2 (two) times daily.   Yes [provider]  Multiple Vitamins-Minerals (CENTRUM SILVER ULTRA WOMENS PO) Take 1 tablet by mouth daily.   Yes [provider]  nitroGLYCERIN (NITROSTAT) 0.4 MG SL tablet Place 1 tablet (0.4 mg total) under the tongue every 5 (five) minutes as needed for chest pain. 12/31/13  Yes Isaiah Serge, NP  pantoprazole (PROTONIX) 40 MG tablet TKAE 1 TABLET 2 TIMES A DAY 11/22/15  Yes Croitoru, Mihai, MD  PARoxetine (PAXIL) 20 MG tablet Take 1 tablet (20 mg total) by mouth every morning. 08/15/16  Yes Chipper Herb, MD  rosuvastatin (CRESTOR) 20 MG tablet Take 1 tablet (20 mg total) by mouth daily. Patient taking differently: Take 10 mg by mouth daily.  11/08/15  Yes Croitoru, Mihai, MD  torsemide  (DEMADEX) 20 MG tablet Take 20 mg by mouth daily as needed (edema).   Yes [provider]  triamcinolone (KENALOG) 0.025 % ointment Apply 1 application topically 2 (two) times daily. On lower back rash 08/19/16  Yes Eustaquio Maize, MD       Allergies:  Allergies  Allergen Reactions  . Bactrim [Sulfamethoxazole-Trimethoprim]     Itchy rash  . Actos [Pioglitazone] Swelling  . Adhesive [Tape] Other (See Comments)    Causes sores  . Contrast Media [Iodinated Diagnostic Agents] Other (See Comments)    Headache (no action/pre-med required)  . Latex Rash  . Lipitor [Atorvastatin] Other (See Comments)    myalgia    Social History   Social History  . Marital status: Widowed    Spouse name: N/A  . Number of children: N/A  . Years of education: N/A   Occupational History  . Not on file.   Social History Main Topics  . Smoking status: Never Smoker  . Smokeless tobacco: Never Used  . Alcohol use No  . Drug use: No  . Sexual activity: No   Other Topics Concern  . Not on file   Social History Narrative  . No narrative on file     Family History  Problem Relation Age of Onset  . Stroke Mother   . Other Mother        brain tumor  . Other Father        MI  . Heart attack Father   . Stroke Sister   . Macular degeneration Sister   . Diabetes Daughter   . Cancer Daughter        ovarian  . Colon polyps Daughter   . Atrial fibrillation Sister   . Hyperlipidemia Sister   . Osteoporosis Sister   . Stroke Sister   . Uterine cancer Sister   . Heart attack Brother   . Heart disease Brother   . Asthma Brother      ROS:  Please see the history of present illness.     All other systems reviewed and negative.    Physical Exam: Blood pressure (!) 160/80, pulse 62, resp. rate 16, height 5\' 2"  (1.575 m), weight 166 lb (75.3 kg), SpO2 97 %. General: Well developed, well nourished female in no  acute distress. Head: Normocephalic, atraumatic, sclera non-icteric, no  xanthomas, nares are without discharge. EENT: normal Lymph Nodes:  none Back: without scoliosis/kyphosis, no CVA tendersness Neck: Negative for carotid bruits. JVD not elevated. Lungs: Clear bilaterally to auscultation without wheezes, rales, or rhonchi. Breathing is unlabored. Heart: RRR with S1 S2. 2/6 murmur , rubs, or gallops appreciated. Abdomen: Soft, non-tender, non-distended with normoactive bowel sounds. No hepatomegaly. No rebound/guarding. No obvious abdominal masses. Msk:  Strength and tone appear normal for age. Extremities: No clubbing or cyanosis. No edema.  Distal pedal pulses are 2+ and equal bilaterally. Skin: Warm and Dry Neuro: Alert and oriented X 3. CN III-XII intact Grossly normal sensory and motor function . Psych:  Responds to questions appropriately with a normal affect.      Labs: Cardiac Enzymes No results for input(s): CKTOTAL, CKMB, TROPONINI in the last 72 hours. CBC Lab Results  Component Value Date   WBC 4.6 10/06/2016   HGB 10.2 (L) 10/06/2016   HCT 32.0 (L) 10/06/2016   MCV 83 10/06/2016   PLT 328 10/06/2016   PROTIME: No results for input(s): LABPROT, INR in the last 72 hours. Chemistry No results for input(s): NA, K, CL, CO2, BUN, CREATININE, CALCIUM, PROT, BILITOT, ALKPHOS, ALT, AST, GLUCOSE in the last 168 hours.  Invalid input(s): LABALBU Lipids Lab Results  Component Value Date   CHOL 152 07/13/2016   HDL 60 07/13/2016   LDLCALC 73 07/13/2016   TRIG 97 07/13/2016   BNP Pro B Natriuretic peptide (BNP)  Date/Time Value Ref Range Status  07/28/2011 10:37 AM 336.1 0 - 450 pg/mL Final  07/27/2011 05:50 AM 292.9 0 - 450 pg/mL Final  07/25/2011 11:38 AM 418.3 0 - 450 pg/mL Final  11/28/2006 04:12 PM 239.0 (H)  Final   Thyroid Function Tests: No results for input(s): TSH, T4TOTAL, T3FREE, THYROIDAB in the last 72 hours.  Invalid input(s): FREET3    Miscellaneous No results found for: DDIMER  Radiology/Studies:  No results  found.  EKG: Sinus at 62 Intervals 23/12/43 Axis (LVH with repolarization   Assessment and Plan:  Ventricular tachycardia-polymorphic  Atrial fibrillation-paroxysmal  Bradycardia-sinus-iatrogenic  Implantable defibrillator-[employer]  Hypertension  Treatment options for her atrial fibrillation are limited. Torsades de pointes with the use of sotalol precludes the use of dofetilide  Amiodarone however is likely safely usable although it may aggravate her bradycardia. This already is being situated by the use of metoprolol so in the event that we choose amiodarone down titration of her metoprolol might preclude the need for backup bradycardia pacing ICD upgrade of her single-chamber defibrillator. She might also be a candidate for pulmonary vein isolation; however, her age might be an issue. Also, it has been since 12/15 that her left atrial size is been evaluated.  I have reached out to Dr. Thermon Leyland to talk about these issues with him. I await his return phone call   Virl Axe

## 2016-11-01 NOTE — Patient Instructions (Addendum)
Medication Instructions:  Your physician has recommended you make the following change in your medication:  1. Start Amlodipine 2.5mg  daily   -- If you need a refill on your cardiac medications before your next appointment, please call your pharmacy. --  Labwork: None ordered  Testing/Procedures: None ordered  Follow-Up: Your physician wants you to follow-up in with Dr. Caryl Comes as needed  Remote monitoring is used to monitor your ICD from home. This monitoring reduces the number of office visits required to check your device to one time per year. It allows [place] to keep an eye on the functioning of your device to ensure it is working properly. You are scheduled for a device check from home on 01/24/2017. You may send your transmission at any time that day. If you have a wireless device, the transmission will be sent automatically. After your physician reviews your transmission, you will receive a postcard with your next transmission date.    Thank you for choosing [facility]!!   [phone]  Any Other Special Instructions Will Be Listed Below (If Applicable).

## 2016-11-03 ENCOUNTER — Other Ambulatory Visit: Payer: Self-pay | Admitting: Cardiovascular Disease

## 2016-11-03 ENCOUNTER — Other Ambulatory Visit: Payer: Self-pay | Admitting: Family Medicine

## 2016-11-08 ENCOUNTER — Telehealth: Payer: Self-pay

## 2016-11-08 LAB — CUP PACEART REMOTE DEVICE CHECK
Battery Remaining Longevity: 144 mo
Brady Statistic RV Percent Paced: 1 %
HIGH POWER IMPEDANCE MEASURED VALUE: 43 Ohm
Implantable Lead Location: 753860
Implantable Lead Model: 157
Lead Channel Pacing Threshold Amplitude: 0.7 V
Lead Channel Pacing Threshold Pulse Width: 0.5 ms
Lead Channel Setting Pacing Amplitude: 2.2 V
Lead Channel Setting Pacing Pulse Width: 0.5 ms
MDC IDC LEAD IMPLANT DT: 20090120
MDC IDC LEAD SERIAL: 138237
MDC IDC MSMT BATTERY REMAINING PERCENTAGE: 100 %
MDC IDC MSMT LEADCHNL RV IMPEDANCE VALUE: 742 Ohm
MDC IDC PG IMPLANT DT: 20160126
MDC IDC PG SERIAL: 191956
MDC IDC SESS DTM: 20181010045400
MDC IDC SET LEADCHNL RV SENSING SENSITIVITY: 0.6 mV

## 2016-11-08 NOTE — Telephone Encounter (Signed)
-----   Message from Irene Shipper, MD sent at 11/07/2016  7:38 PM EDT ----- Regarding: Capsule results Vaughan Basta, Please let the patient know that her capsule endoscopy did not show any significant abnormalities of the small bowel. She should continue on iron. She should have follow-up CBC and ferritin at Dr. Tawanna Sat office (her preference) in a few weeks but those results should be sent to me. We will decide on further management and follow-up thereafter. Please convert this to a phone note so I may refer to it later. Thank you Dr. Henrene Pastor

## 2016-11-08 NOTE — Telephone Encounter (Signed)
Spoke with pt and she is aware. Pt states she is having labs done this week and has an appt with Dr. Laurance Flatten next week.

## 2016-11-09 ENCOUNTER — Other Ambulatory Visit: Payer: Self-pay | Admitting: Family Medicine

## 2016-11-10 ENCOUNTER — Encounter: Payer: Self-pay | Admitting: Cardiology

## 2016-11-10 ENCOUNTER — Telehealth: Payer: Self-pay | Admitting: Cardiovascular Disease

## 2016-11-10 NOTE — Telephone Encounter (Signed)
Patient called and stated that she had an episode on Wednesday morning around 4 AM and again Thursday around 4:30 PM where she felt like she was going to pass out but she did not pass out. She grabbed a chair and sat down. Pt stated that she has not felt her self today, she has felt faint. Attempted to help pt send a manual transmission. Transmission was not successful. Instructed pt to call tech support to receive help trouble shooting her monitor. Informed her that once a remote transmission is received a Chief Operating Officer will review and call her back w/ results.

## 2016-11-10 NOTE — Telephone Encounter (Signed)
I believe the dose is too low. Please increase to 5 mg daily. MCr

## 2016-11-10 NOTE — Telephone Encounter (Signed)
Remote received. Presenting rhythm: Vs 70s. No episodes recorded. Stable device function.  Amlodipine 2.5mg  started on 11/01/16.  Patient states that her BPs have been higher at home since starting the medication. She said that she has gotten systolic measurements between 157-180s. She said that when the amlodipine was started SK said that he was going to talk to [facility]. She wants to see what MC's thoughts are about the medication and about whether or not she will need a BiV in the future.   Will route information to Dr.Croitoru and notify patient if anything further is recommended. Patient verbalized understanding.

## 2016-11-10 NOTE — Telephone Encounter (Signed)
New message   Pt is calling asking for a call back about her device.

## 2016-11-13 ENCOUNTER — Other Ambulatory Visit: Payer: Self-pay | Admitting: Cardiovascular Disease

## 2016-11-13 ENCOUNTER — Other Ambulatory Visit: Payer: Self-pay | Admitting: Family Medicine

## 2016-11-13 MED ORDER — AMLODIPINE BESYLATE 5 MG PO TABS: 5.0000 mg | ORAL_TABLET | Freq: Every day | ORAL | 5 refills | Status: DC

## 2016-11-13 NOTE — Telephone Encounter (Signed)
Informed patient of Dr.Croitoru's recommendation. Rx sent to [facility]. Patient verbalized understanding.

## 2016-11-14 DIAGNOSIS — H353212 Exudative age-related macular degeneration, right eye, with inactive choroidal neovascularization: Secondary | ICD-10-CM | POA: Diagnosis not present

## 2016-11-14 DIAGNOSIS — H353211 Exudative age-related macular degeneration, right eye, with active choroidal neovascularization: Secondary | ICD-10-CM | POA: Diagnosis not present

## 2016-11-14 DIAGNOSIS — H353221 Exudative age-related macular degeneration, left eye, with active choroidal neovascularization: Secondary | ICD-10-CM | POA: Diagnosis not present

## 2016-11-14 DIAGNOSIS — H3561 Retinal hemorrhage, right eye: Secondary | ICD-10-CM | POA: Diagnosis not present

## 2016-11-16 LAB — CUP PACEART INCLINIC DEVICE CHECK
Implantable Lead Implant Date: 20090120
Implantable Lead Location: 753860
Implantable Lead Serial Number: 138237
Implantable Pulse Generator Implant Date: 20160126
Lead Channel Pacing Threshold Pulse Width: 0.5 ms
MDC IDC MSMT LEADCHNL RV PACING THRESHOLD AMPLITUDE: 0.9 V
MDC IDC MSMT LEADCHNL RV SENSING INTR AMPL: 20 mV
MDC IDC SESS DTM: 20181101165627
Pulse Gen Serial Number: 191956

## 2016-11-17 ENCOUNTER — Encounter: Payer: Self-pay | Admitting: Family Medicine

## 2016-11-17 ENCOUNTER — Ambulatory Visit (INDEPENDENT_AMBULATORY_CARE_PROVIDER_SITE_OTHER): Payer: Medicare Other | Admitting: Family Medicine

## 2016-11-17 VITALS — BP 140/69 | HR 57 | Temp 98.2°F | Ht 62.0 in | Wt 167.0 lb

## 2016-11-17 DIAGNOSIS — E7800 Pure hypercholesterolemia, unspecified: Secondary | ICD-10-CM | POA: Diagnosis not present

## 2016-11-17 DIAGNOSIS — N183 Chronic kidney disease, stage 3 unspecified: Secondary | ICD-10-CM

## 2016-11-17 DIAGNOSIS — I480 Paroxysmal atrial fibrillation: Secondary | ICD-10-CM | POA: Diagnosis not present

## 2016-11-17 DIAGNOSIS — E889 Metabolic disorder, unspecified: Secondary | ICD-10-CM | POA: Diagnosis not present

## 2016-11-17 DIAGNOSIS — E559 Vitamin D deficiency, unspecified: Secondary | ICD-10-CM

## 2016-11-17 DIAGNOSIS — I259 Chronic ischemic heart disease, unspecified: Secondary | ICD-10-CM

## 2016-11-17 DIAGNOSIS — I5032 Chronic diastolic (congestive) heart failure: Secondary | ICD-10-CM

## 2016-11-17 DIAGNOSIS — Z23 Encounter for immunization: Secondary | ICD-10-CM | POA: Diagnosis not present

## 2016-11-17 DIAGNOSIS — E119 Type 2 diabetes mellitus without complications: Secondary | ICD-10-CM

## 2016-11-17 DIAGNOSIS — R300 Dysuria: Secondary | ICD-10-CM | POA: Diagnosis not present

## 2016-11-17 DIAGNOSIS — I10 Essential (primary) hypertension: Secondary | ICD-10-CM

## 2016-11-17 DIAGNOSIS — R5383 Other fatigue: Secondary | ICD-10-CM | POA: Diagnosis not present

## 2016-11-17 DIAGNOSIS — G63 Polyneuropathy in diseases classified elsewhere: Secondary | ICD-10-CM

## 2016-11-17 LAB — MICROSCOPIC EXAMINATION
RBC, UA: >30 /hpf — AB (ref 0–?)
WBC, UA: >30 /hpf — AB (ref 0–?)

## 2016-11-17 LAB — URINALYSIS, COMPLETE
BILIRUBIN UA: NEGATIVE
Glucose, UA: NEGATIVE
KETONES UA: NEGATIVE
NITRITE UA: POSITIVE — AB
SPEC GRAV UA: 1.015 (ref 1.005–1.030)
Urobilinogen, Ur: 0.2 mg/dL (ref 0.2–1.0)
pH, UA: 5.5 (ref 5.0–7.5)

## 2016-11-17 LAB — BAYER DCA HB A1C WAIVED: HB A1C (BAYER DCA - WAIVED): 7.5 % — ABNORMAL HIGH (ref <7.0)

## 2016-11-17 NOTE — Patient Instructions (Addendum)
Medicare Annual Wellness Visit  [facility] and the medical providers at [facility] strive to bring you the best medical care.  In doing so we not only want to address your current medical conditions and concerns but also to detect new conditions early and prevent illness, disease and health-related problems.    Medicare offers a yearly Wellness Visit which allows our clinical staff to assess your need for preventative services including immunizations, lifestyle education, counseling to decrease risk of preventable diseases and screening for fall risk and other medical concerns.    This visit is provided free of charge (no copay) for all Medicare recipients. The clinical pharmacists at [facility] have begun to conduct these Wellness Visits which will also include a thorough review of all your medications.    As you primary medical provider recommend that you make an appointment for your Annual Wellness Visit if you have not done so already this year.  You may set up this appointment before you leave today or you may call back ([phone]) and schedule an appointment.  Please make sure when you call that you mention that you are scheduling your Annual Wellness Visit with the clinical pharmacist so that the appointment may be made for the proper length of time.     Continue current medications. Continue good therapeutic lifestyle changes which include good diet and exercise. Fall precautions discussed with patient. If an FOBT was given today- please return it to our front desk. If you are over 37 years old - you may need Prevnar 39 or the adult Pneumonia vaccine.  **Flu shots are available--- please call and schedule a FLU-CLINIC appointment**  After your visit with [place] today you will receive a survey in the mail or online from [employer] regarding your care with [place]. Please take a moment to fill this out. Your feedback is very  important to [place] as you can help [place] better understand your patient needs as well as improve your experience and satisfaction. WE CARE ABOUT YOU!!!   The patient will give the medication change a couple of more weeks to see if this helps her feel better especially in light of the syncope episodes that she is feeling. If she is still having the syncopal episodes, we will call the cardiologist and have him see the patient sooner rather than later. She will keep checking blood pressure readings at home She will be careful not to put herself at risk for falling She should stand before walking She should keep drinking plenty of fluids She should keep the house as cool as possible She should take Mucinex plain 1 twice daily if needed for cough and congestion we will call with blood work results and make sure that the cardiologist And gastroenterologist get copies of these reports

## 2016-11-17 NOTE — Progress Notes (Signed)
Subjective:    Patient ID: [name], female    DOB: 01/13/1932, 83 y.o.   MRN: [id number]  HPI Pt here for follow up and management of chronic medical problems which includes diabetes, hyperlipidemia and hypertension. She is taking medication regularly.  This patient has a lot of medical issues.  A lot of them are cardiac related with pacemaker lightheadedness and medication that she takes for this.  She does complain of fatigue back pain congestion and some uncomfortable feelings with passing her water.  The notes from the 2 cardiologist were reviewed.  She also sees a gastroenterologist for irritable bowel syndrome.  She also has type 2 diabetes and chronic kidney disease.  She is taking a blood thinner because of a DVT in the past.  She also has on board Crestor.  She had a cardioversion in August.  She is recently had an upper endoscopy also.  This was done by Dr. Henrene Pastor.  This was done because of heme positive stools and everything was normal with the upper endoscopy.  The patient's interval history is somewhat complicated.  She has had an endoscopy that was negative and ultimately had a capsule endoscopy which showed no sign of any blood loss.  She has had her amlodipine increased to 5 mg daily and this was done just recently.  She has had 2 episodes of lightheadedness feeling like she is going to pass out but none since starting the increased amlodipine and her blood pressure readings are better with this.  There is a question about changing pacemakers and she is not sure about the conversation between Dr. Caryl Comes and Dr. Loletha Grayer for doing this.  We will ask her to do more recordkeeping on how she feels over the next couple of weeks and if she is not better hopefully we can get her in to see Dr. Loletha Grayer sooner rather than in January.  We will check lab work today including a thyroid profile and a CBC and make sure that Dr. Caryl Comes, Dr. Loletha Grayer, and Dr. Henrene Pastor get copies of this blood work.  Her blood pressures have  been better with the increased dose of amlodipine.  She does describe some chest pain that is a heavy feeling at times lasting for as long as 10 minutes and this has increased in frequency.  She denies any shortness of breath.  She continues to have loose bowel movements and this is been an ongoing problem for years but has not seen any blood in the stool or has had any black tarry bowel movements.  She is passing her water without problems.    Patient Active Problem List   Diagnosis Date Noted  . History of drug-induced prolonged QT interval with torsade de pointes 11/01/2016  . Iron deficiency anemia 10/30/2016  . Long term current use of anticoagulant 02/17/2016  . Near syncope 05/22/2015  . Diabetes mellitus type 2, diet-controlled ([place]) 09/14/2014  . ICD (implantable cardioverter-defibrillator) battery depletion 01/20/2014  . Abnormal nuclear stress test, 12/30/13 12/31/2013  . CAD in native artery 12/31/2013  . Unstable angina, neg MI, cath stable.maybe GI 12/30/2013  . Paroxysmal atrial fibrillation, chads2 Vasc2 score of 5, on eliquis 10/16/2013  . Catecholaminergic polymorphic ventricular tachycardia ([place]) 10/16/2013  . Ureteral stone with hydronephrosis 05/20/2013  . Metabolic syndrome 62/83/1517  . Hypotension, meds stopped this admission 07/29/2011  . Orthostatic hypotension 07/27/2011  . Chest pain, r/o cardiac, negative MI 07/25/2011  . Chronic sinus bradycardia 07/25/2011  . Automatic implantable cardioverter-defibrillator  in situ 07/25/2011  . CKD (chronic kidney disease) stage 3, GFR 30-59 ml/min ([facility]) 07/25/2011    Class: Chronic  . History of DVT in the past, on eliquis now 07/25/2011    Class: History of  . Nephrolithiasis, just saw Dr [name] 07/25/2011    Class: History of  . DYSPHAGIA 03/24/2010  . Chronic ischemic heart disease 12/03/2007  . RECTAL BLEEDING 12/03/2007  . Irritable bowel syndrome with diarrhea 12/03/2007  . EPIGASTRIC PAIN 12/03/2007  .  Hyperlipidemia 12/02/2007    Class: History of  . EXTERNAL HEMORRHOIDS 12/02/2007    Class: History of  . ESOPHAGITIS 12/02/2007  . GASTRITIS 12/02/2007  . COLITIS 12/02/2007  . DIVERTICULOSIS, COLON 12/02/2007  . ARTHRITIS 12/02/2007    Class: Chronic   Outpatient Encounter Prescriptions as of 11/17/2016  Medication Sig  . Calcium Citrate-Vitamin D (CITRACAL + D PO) Take 1 tablet by mouth daily.  . Cholecalciferol (VITAMIN D) 1000 UNITS capsule Take 2,000 Units by mouth daily.   Marland Kitchen ELIQUIS 5 MG TABS tablet Take 1 tablet (5 mg total) by mouth 2 (two) times daily.  . ferrous sulfate 325 (65 FE) MG tablet Take 1 tablet (325 mg total) by mouth 2 (two) times daily with a meal.  . gabapentin (NEURONTIN) 100 MG capsule TAKE 1 CAPSULE AT BEDTIME  . lisinopril (PRINIVIL,ZESTRIL) 20 MG tablet TAKE  (1)  TABLET TWICE A DAY.  . methocarbamol (ROBAXIN) 500 MG tablet Take 1 tablet (500 mg total) by mouth 2 (two) times daily.  . metoprolol tartrate (LOPRESSOR) 25 MG tablet TAKE (1) TABLET TWICE A DAY AT 10 A.M. & 10 P.M.  . metoprolol tartrate (LOPRESSOR) 50 MG tablet Take 50 mg by mouth 2 (two) times daily.  . Multiple Vitamins-Minerals (CENTRUM SILVER ULTRA WOMENS PO) Take 1 tablet by mouth daily.  . nitroGLYCERIN (NITROSTAT) 0.4 MG SL tablet Place 1 tablet (0.4 mg total) under the tongue every 5 (five) minutes as needed for chest pain.  . pantoprazole (PROTONIX) 40 MG tablet TKAE 1 TABLET 2 TIMES A DAY  . PARoxetine (PAXIL) 20 MG tablet Take 1 tablet (20 mg total) by mouth every morning.  . rosuvastatin (CRESTOR) 20 MG tablet Take 1 tablet (20 mg total) by mouth daily. (Patient taking differently: Take 10 mg by mouth daily. )  . rosuvastatin (CRESTOR) 40 MG tablet TAKE 1 TABLET DAILY  . torsemide (DEMADEX) 20 MG tablet Take 20 mg by mouth daily as needed (edema).  . triamcinolone (KENALOG) 0.025 % ointment Apply 1 application topically 2 (two) times daily. On lower back rash  . [DISCONTINUED]  amLODipine (NORVASC) 5 MG tablet Take 1 tablet (5 mg total) by mouth daily.  Marland Kitchen amLODipine (NORVASC) 5 MG tablet Take 1 tablet by mouth daily.   No facility-administered encounter medications on file as of 11/17/2016.       Review of Systems  Constitutional: Positive for fatigue.  HENT: Positive for congestion.   Eyes: Negative.   Respiratory: Negative.   Cardiovascular: Negative.   Gastrointestinal: Negative.   Endocrine: Negative.   Genitourinary: Positive for dysuria.  Musculoskeletal: Positive for back pain.  Skin: Negative.   Allergic/Immunologic: Negative.   Neurological: Positive for light-headedness ("almost passed out " (2 of these episodes)).  Hematological: Negative.   Psychiatric/Behavioral: Negative.        Objective:   Physical Exam  Constitutional: She is oriented to person, place, and time. She appears well-developed and well-nourished. No distress.  Patient is elderly and pleasant and alert  with multiple complaints but calm in reporting these.   HENT:  Head: Normocephalic and atraumatic.  Right Ear: External ear normal.  Left Ear: External ear normal.  Nose: Nose normal.  Mouth/Throat: Oropharynx is clear and moist.  Eyes: Pupils are equal, round, and reactive to light. Conjunctivae and EOM are normal. Right eye exhibits no discharge. Left eye exhibits no discharge. No scleral icterus.  Neck: Normal range of motion. Neck supple. No thyromegaly present.  No bruits thyromegaly or anterior cervical adenopathy  Cardiovascular: Normal rate, regular rhythm, normal heart sounds and intact distal pulses.   No murmur heard. The heart is regular at 60/min  Pulmonary/Chest: Effort normal and breath sounds normal. No respiratory distress. She has no wheezes. She has no rales.  Clear anteriorly and posteriorly  Abdominal: Soft. Bowel sounds are normal. She exhibits no mass. There is no tenderness. There is no rebound and no guarding.  Generalized tenderness without  masses or organ enlargement or bruits  Musculoskeletal: Normal range of motion. She exhibits no edema.  Lymphadenopathy:    She has no cervical adenopathy.  Neurological: She is alert and oriented to person, place, and time. She has normal reflexes. No cranial nerve deficit.  Skin: Skin is warm and dry. No rash noted.  Psychiatric: She has a normal mood and affect. Her behavior is normal. Judgment and thought content normal.  Nursing note and vitals reviewed.   BP 140/69 (BP Location: Left Arm)   Pulse (!) 57   Temp 98.2 F (36.8 C) (Oral)   Ht '5\' 2"'$  (1.575 m)   Wt 167 lb (75.8 kg)   BMI 30.54 kg/m        Assessment & Plan:  1. Essential hypertension -The blood pressure is good today and has been better at home and the patient will continue with her current treatment with amlodipine of 5 mg metoprolol and lisinopril. - BMP8+EGFR - CBC with Differential/Platelet - Hepatic function panel  2. Hyperlipidemia -Continue with Crestor and as aggressive therapeutic lifestyle changes as possible pending results of lab work - CBC with Differential/Platelet - Lipid panel  3. Vitamin D deficiency -Continue with current vitamin D replacement pending results of lab work - CBC with Differential/Platelet - VITAMIN D 25 Hydroxy (Vit-D Deficiency, Fractures)  4. Diabetes mellitus type 2, diet-controlled ([place]) -The patient is currently not taking any medication for her diabetes.  She has been controlling her blood sugar with her diet so far.  She does have increased peripheral neuropathy with diminished sensation in both feet. - CBC with Differential/Platelet - Bayer DCA Hb A1c Waived  5. Paroxysmal atrial fibrillation, chads2 Vasc2 score of 5, on eliquis -Follow-up with cardiology as planned and the patient today is in a regular rhythm at 60/min - CBC with Differential/Platelet  6. Dysuria -We will check a urine today because of the complaints of dysuria - Urine Culture - CBC with  Differential/Platelet - Urinalysis, Complete  7. Fatigue, unspecified type - Thyroid Panel With TSH  8.  Chronic diastolic heart failure -Follow-up with cardiology as planned  9.  Chronic kidney disease stage III with a GFR of 30-59 -Avoid all NSAIDs  10.  Peripheral neuropathy with decreased sensation in both feet and diabetes mellitus -Monitor feet regularly  Patient Instructions                       Medicare Annual Wellness Visit  [facility] and the medical providers at [facility] strive to bring  you the best medical care.  In doing so we not only want to address your current medical conditions and concerns but also to detect new conditions early and prevent illness, disease and health-related problems.    Medicare offers a yearly Wellness Visit which allows our clinical staff to assess your need for preventative services including immunizations, lifestyle education, counseling to decrease risk of preventable diseases and screening for fall risk and other medical concerns.    This visit is provided free of charge (no copay) for all Medicare recipients. The clinical pharmacists at [facility] have begun to conduct these Wellness Visits which will also include a thorough review of all your medications.    As you primary medical provider recommend that you make an appointment for your Annual Wellness Visit if you have not done so already this year.  You may set up this appointment before you leave today or you may call back ([phone]) and schedule an appointment.  Please make sure when you call that you mention that you are scheduling your Annual Wellness Visit with the clinical pharmacist so that the appointment may be made for the proper length of time.     Continue current medications. Continue good therapeutic lifestyle changes which include good diet and exercise. Fall precautions discussed with patient. If an FOBT was given today-  please return it to our front desk. If you are over 64 years old - you may need Prevnar 7 or the adult Pneumonia vaccine.  **Flu shots are available--- please call and schedule a FLU-CLINIC appointment**  After your visit with [place] today you will receive a survey in the mail or online from [employer] regarding your care with [place]. Please take a moment to fill this out. Your feedback is very important to [place] as you can help [place] better understand your patient needs as well as improve your experience and satisfaction. WE CARE ABOUT YOU!!!   The patient will give the medication change a couple of more weeks to see if this helps her feel better especially in light of the syncope episodes that she is feeling. If she is still having the syncopal episodes, we will call the cardiologist and have him see the patient sooner rather than later. She will keep checking blood pressure readings at home She will be careful not to put herself at risk for falling She should stand before walking She should keep drinking plenty of fluids She should keep the house as cool as possible She should take Mucinex plain 1 twice daily if needed for cough and congestion we will call with blood work results and make sure that the cardiologist And gastroenterologist get copies of these reports  Arrie Senate MD

## 2016-11-18 ENCOUNTER — Telehealth: Payer: Self-pay | Admitting: Family Medicine

## 2016-11-18 DIAGNOSIS — N390 Urinary tract infection, site not specified: Secondary | ICD-10-CM

## 2016-11-18 LAB — HEPATIC FUNCTION PANEL
ALBUMIN: 4 g/dL (ref 3.5–4.7)
ALT: 10 IU/L (ref 0–32)
AST: 16 IU/L (ref 0–40)
Alkaline Phosphatase: 85 IU/L (ref 39–117)
Bilirubin Total: 0.7 mg/dL (ref 0.0–1.2)
Bilirubin, Direct: 0.18 mg/dL (ref 0.00–0.40)
TOTAL PROTEIN: 6.7 g/dL (ref 6.0–8.5)

## 2016-11-18 LAB — BMP8+EGFR
BUN / CREAT RATIO: 16 (ref 12–28)
BUN: 20 mg/dL (ref 8–27)
CO2: 21 mmol/L (ref 20–29)
CREATININE: 1.24 mg/dL — AB (ref 0.57–1.00)
Calcium: 9.4 mg/dL (ref 8.7–10.3)
Chloride: 101 mmol/L (ref 96–106)
GFR calc Af Amer: 46 mL/min/{1.73_m2} — ABNORMAL LOW (ref >59)
GFR calc non Af Amer: 40 mL/min/{1.73_m2} — ABNORMAL LOW (ref >59)
GLUCOSE: 190 mg/dL — AB (ref 65–99)
Potassium: 4.3 mmol/L (ref 3.5–5.2)
SODIUM: 137 mmol/L (ref 134–144)

## 2016-11-18 LAB — LIPID PANEL
CHOL/HDL RATIO: 2.5 ratio (ref 0.0–4.4)
Cholesterol, Total: 151 mg/dL (ref 100–199)
HDL: 61 mg/dL (ref >39)
LDL Calculated: 67 mg/dL (ref 0–99)
Triglycerides: 113 mg/dL (ref 0–149)
VLDL Cholesterol Cal: 23 mg/dL (ref 5–40)

## 2016-11-18 LAB — CBC WITH DIFFERENTIAL/PLATELET
BASOS: 1 %
Basophils Absolute: 0 10*3/uL (ref 0.0–0.2)
EOS (ABSOLUTE): 0.3 10*3/uL (ref 0.0–0.4)
EOS: 5 %
HEMATOCRIT: 35.6 % (ref 34.0–46.6)
Hemoglobin: 11.4 g/dL (ref 11.1–15.9)
Immature Grans (Abs): 0 10*3/uL (ref 0.0–0.1)
Immature Granulocytes: 0 %
LYMPHS ABS: 1.3 10*3/uL (ref 0.7–3.1)
Lymphs: 19 %
MCH: 27.3 pg (ref 26.6–33.0)
MCHC: 32 g/dL (ref 31.5–35.7)
MCV: 85 fL (ref 79–97)
MONOS ABS: 0.6 10*3/uL (ref 0.1–0.9)
Monocytes: 9 %
Neutrophils Absolute: 4.4 10*3/uL (ref 1.4–7.0)
Neutrophils: 66 %
Platelets: 293 10*3/uL (ref 150–379)
RBC: 4.18 x10E6/uL (ref 3.77–5.28)
RDW: 19.6 % — AB (ref 12.3–15.4)
WBC: 6.7 10*3/uL (ref 3.4–10.8)

## 2016-11-18 LAB — THYROID PANEL WITH TSH
Free Thyroxine Index: 2.2 (ref 1.2–4.9)
T3 UPTAKE RATIO: 28 % (ref 24–39)
T4 TOTAL: 7.9 ug/dL (ref 4.5–12.0)
TSH: 2.56 u[IU]/mL (ref 0.450–4.500)

## 2016-11-18 LAB — VITAMIN D 25 HYDROXY (VIT D DEFICIENCY, FRACTURES): VIT D 25 HYDROXY: 25.9 ng/mL — AB (ref 30.0–100.0)

## 2016-11-20 MED ORDER — CEFDINIR 300 MG PO CAPS: 300.0000 mg | ORAL_CAPSULE | Freq: Two times a day (BID) | ORAL | 0 refills | Status: DC

## 2016-11-20 NOTE — Telephone Encounter (Signed)
Patient aware of results and rx sent to pharmacy.  

## 2016-11-24 LAB — URINE CULTURE

## 2016-11-29 ENCOUNTER — Other Ambulatory Visit: Payer: Self-pay | Admitting: Family Medicine

## 2016-11-29 DIAGNOSIS — Z139 Encounter for screening, unspecified: Secondary | ICD-10-CM

## 2016-12-04 ENCOUNTER — Other Ambulatory Visit: Payer: Medicare Other

## 2016-12-04 DIAGNOSIS — N390 Urinary tract infection, site not specified: Secondary | ICD-10-CM | POA: Diagnosis not present

## 2016-12-06 ENCOUNTER — Telehealth: Payer: Self-pay | Admitting: Family Medicine

## 2016-12-06 LAB — URINE CULTURE

## 2016-12-06 NOTE — Telephone Encounter (Signed)
Notified patient that we do not have the results of her urine culture from 12/04/16 back yet, but will call her with them as soon as we receive and they are reviewed by Dr. Laurance Flatten.

## 2016-12-13 ENCOUNTER — Encounter: Payer: Self-pay | Admitting: Family Medicine

## 2016-12-13 ENCOUNTER — Ambulatory Visit (INDEPENDENT_AMBULATORY_CARE_PROVIDER_SITE_OTHER): Payer: Medicare Other | Admitting: Family Medicine

## 2016-12-13 VITALS — BP 136/75 | HR 57 | Temp 96.7°F | Ht 62.0 in | Wt 165.0 lb

## 2016-12-13 DIAGNOSIS — Z95810 Presence of automatic (implantable) cardiac defibrillator: Secondary | ICD-10-CM | POA: Diagnosis not present

## 2016-12-13 DIAGNOSIS — I259 Chronic ischemic heart disease, unspecified: Secondary | ICD-10-CM

## 2016-12-13 DIAGNOSIS — R531 Weakness: Secondary | ICD-10-CM

## 2016-12-13 DIAGNOSIS — R55 Syncope and collapse: Secondary | ICD-10-CM

## 2016-12-13 DIAGNOSIS — I480 Paroxysmal atrial fibrillation: Secondary | ICD-10-CM

## 2016-12-13 DIAGNOSIS — R002 Palpitations: Secondary | ICD-10-CM

## 2016-12-13 NOTE — Progress Notes (Signed)
Cardiology Office Note   Date:  12/13/2016   ID:  Anelise, Staron 1931/05/25, MRN [id number]  PCP:  Chipper Herb, MD  Cardiologist:   Minus Breeding, MD  Referring:  Chipper Herb, MD  No chief complaint on file.     History of Present Illness: [name] is a 83 y.o. female who presents for evaluation of palpitations.  She was added to my schedule today after Dr. Laurance Flatten called me.  She has been having palpitations.  She saw Dr. Caryl Comes in October for this and it was thought that this was probably symptomatic atrial fibrillation and he considered starting amiodarone but wanted to discuss this with Dr. Sallyanne Kuster.  However, in the meantime the patient's had increasing symptoms.  Is really been bad for the last week.  She is having 3-4 episodes a day.  She feels like she is going to pass out.  She feels palpitations.  She feels a warmness that goes through her body.  She feels some weight on her chest and dizziness in her head.  It might last for 5-10 minutes.  She has not had any chest pressure, neck or arm discomfort.  She has some chronic edema and has been relatively unchanged.  She has not had any significant change in her weight.  Past Medical History:  Diagnosis Date  . AICD (automatic cardioverter/defibrillator) present   . Allergy    SESONAL  . Anxiety   . ARTHRITIS   . Arthritis   . Atrial fibrillation ([place])   . CAD (coronary artery disease)    a. history of cardiac arrest 1993;  b. s/p LAD/LCX stenting in 2003;  c. 07/2011 Cath: LM nl, LAD patent stent, D1 80ost, LCX patent stent, RCA min irregs;  d. 04/2012 MV: EF 66%, no ishcemia;  e. 12/2013 Echo: Ef 55%, no rwma, Gr 1 DD, triv AI/MR, mildly dil LA;  f. 12/2013 Lexi MV: intermediate risk - apical ischemia and inf/infsept fixed defect, ? artifactual.  . CAD in native artery 12/31/2013   Previous stents to LAD and Ramus patent on cath today 12/31/13 also There is severe disease in the ostial first diagonal which  is unchanged from most recent cardiac catheterization. The right coronary artery could not be engaged selectively but nonselective angiography showed no significant disease in the proximal and midsegment.     . Cataract    DENIES  . Chronic back pain   . Chronic sinus bradycardia   . CKD (chronic kidney disease), stage III ([place])   . Clotting disorder (H[facility])    DVT  . Diabetes mellitus without complication ([place])    DENIES  . Diverticulosis of colon (without mention of hemorrhage) 2007   Colonoscopy   . Esophageal stricture    a. 2012 s/p dil.  . Esophagitis, unspecified    a. 2012 EGD  . EXTERNAL HEMORRHOIDS   . GERD (gastroesophageal reflux disease)    omeprazole  . H/O hiatal hernia   . Hiatal hernia    a. 2012 EGD.  Marland Kitchen History of DVT in the past, not on Coumadin now    left  leg  . HYPERCHOLESTEROLEMIA   . Hypertension   . ICD (implantable cardiac defibrillator) in place    a. s/p initial ICD in 1993 in setting of cardiac arrest;  b. 01/2007 gen change: Guidant T135 Vitality DS VR single lead ICD.  . Macular degeneration    gets injecton in eye every 5 weeks- last injection -  05/03/2013   . Myocardial infarction ([place]) 1993  . Nephrolithiasis, just saw Dr [name]   . Orthostatic hypotension   . Peripheral vascular disease ([place])    ???  . Unspecified gastritis and gastroduodenitis without mention of hemorrhage    a. 2003 EGD->not noted on 2012 EGD.    Past Surgical History:  Procedure Laterality Date  . CARDIAC CATHETERIZATION    . CARDIAC CATHETERIZATION  01/01/15   patent stents -there is severe disease in ostial 1st diag but without change.   Marland Kitchen CARDIAC DEFIBRILLATOR PLACEMENT  02/05/2007   Guidant  . CARDIOVASCULAR STRESS TEST  12/30/2013   abnormal  . COLONOSCOPY    . coronary stents     . CYSTOSCOPY WITH URETEROSCOPY Right 05/20/2013   Procedure: CYSTOSCOPY, RIGHT URETEROSCOPY STONE EXTRACTION, Insertion of right DOUBLE J STENT ;  Surgeon: Irine Seal, MD;   Location: [facility];  Service: Urology;  Laterality: Right;  . CYSTOSCOPY WITH URETEROSCOPY AND STENT PLACEMENT N/A 06/03/2013   Procedure: SECOND LOOK CYSTOSCOPY WITH URETEROSCOPY  HOLMIUM LASER LITHO AND STONE EXTRACTION Sammie Bench ;  Surgeon: Malka So, MD;  Location: [facility];  Service: Urology;  Laterality: N/A;  . GIVENS CAPSULE STUDY N/A 10/30/2016   Procedure: GIVENS CAPSULE STUDY;  Surgeon: Irene Shipper, MD;  Location: [facility];  Service: Endoscopy;  Laterality: N/A;  NEEDS TO BE ADMITTED FOR OBSERVATION PT. HAS DEFIB  . HEMORROIDECTOMY  80's  . HOLMIUM LASER APPLICATION Right 05/19/6268   Procedure: HOLMIUM LASER APPLICATION;  Surgeon: Irine Seal, MD;  Location: [facility];  Service: Urology;  Laterality: Right;  . HYSTEROSCOPY W/D&C  09/02/2010   Procedure: DILATATION AND CURETTAGE (D&C) /HYSTEROSCOPY;  Surgeon: Margarette Asal;  Location: [facility];  Service: Gynecology;  Laterality: N/A;  Dilation and Curettage with Hysteroscopy and Polypectomy  . IMPLANTABLE CARDIOVERTER DEFIBRILLATOR (ICD) GENERATOR CHANGE N/A 02/10/2014   Procedure: ICD GENERATOR CHANGE;  Surgeon: Sanda Klein, MD;  Location: [facility] CATH [facility];  Service: Cardiovascular;  Laterality: N/A;  . LEFT HEART CATHETERIZATION WITH CORONARY ANGIOGRAM N/A 07/28/2011   Procedure: LEFT HEART CATHETERIZATION WITH CORONARY ANGIOGRAM;  Surgeon: Lorretta Harp, MD;  Location[facility];  Service: Cardiovascular;  Laterality: N/A;  . LEFT HEART CATHETERIZATION WITH CORONARY ANGIOGRAM N/A 12/31/2013   Procedure: LEFT HEART CATHETERIZATION WITH CORONARY ANGIOGRAM;  Surgeon: Wellington Hampshire, MD;  Location: [facility] CATH LAB;  Service: Cardiovascular;  Laterality: N/A;     Current Outpatient Medications  Medication Sig Dispense Refill  . amLODipine (NORVASC) 5 MG tablet Take 1 tablet by mouth daily.    . Calcium Citrate-Vitamin D (CITRACAL + D PO) Take 1 tablet by mouth daily.    . cefdinir (OMNICEF) 300 MG capsule Take 1 capsule (300 mg total) 2  (two) times daily by mouth. 1 po BID 20 capsule 0  . Cholecalciferol (VITAMIN D) 1000 UNITS capsule Take 2,000 Units by mouth daily.     Marland Kitchen ELIQUIS 5 MG TABS tablet Take 1 tablet (5 mg total) by mouth 2 (two) times daily. 180 tablet 1  . ferrous sulfate 325 (65 FE) MG tablet Take 1 tablet (325 mg total) by mouth 2 (two) times daily with a meal. 180 tablet 3  . gabapentin (NEURONTIN) 100 MG capsule TAKE 1 CAPSULE AT BEDTIME 90 capsule 0  . lisinopril (PRINIVIL,ZESTRIL) 20 MG tablet TAKE  (1)  TABLET TWICE A DAY. 180 tablet 1  . methocarbamol (ROBAXIN) 500 MG tablet Take 1 tablet (500 mg total) by  mouth 2 (two) times daily. 20 tablet 0  . metoprolol tartrate (LOPRESSOR) 50 MG tablet Take 50 mg by mouth 2 (two) times daily.    . Multiple Vitamins-Minerals (CENTRUM SILVER ULTRA WOMENS PO) Take 1 tablet by mouth daily.    . nitroGLYCERIN (NITROSTAT) 0.4 MG SL tablet Place 1 tablet (0.4 mg total) under the tongue every 5 (five) minutes as needed for chest pain. 25 tablet 4  . pantoprazole (PROTONIX) 40 MG tablet TKAE 1 TABLET 2 TIMES A DAY 60 tablet 6  . PARoxetine (PAXIL) 20 MG tablet Take 1 tablet (20 mg total) by mouth every morning. 90 tablet 0  . rosuvastatin (CRESTOR) 20 MG tablet Take 1 tablet (20 mg total) by mouth daily. (Patient taking differently: Take 10 mg by mouth daily. ) 90 tablet 3  . torsemide (DEMADEX) 20 MG tablet Take 20 mg by mouth daily as needed (edema).    . triamcinolone (KENALOG) 0.025 % ointment Apply 1 application topically 2 (two) times daily. On lower back rash 80 g 0   No current facility-administered medications for this visit.     Allergies:   Sotalol; Bactrim [sulfamethoxazole-trimethoprim]; Actos [pioglitazone]; Adhesive [tape]; Contrast media [iodinated diagnostic agents]; Latex; and Lipitor [atorvastatin]    ROS:  Please see the history of present illness.   Otherwise, review of systems are positive for none.   All other systems are reviewed and negative.     PHYSICAL EXAM: VS:  There were no vitals taken for this visit. , BMI There is no height or weight on file to calculate BMI. GENERAL:  Frail appearing HEENT:  Pupils equal round and reactive, fundi not visualized, oral mucosa unremarkable NECK:  No jugular venous distention, waveform within normal limits, carotid upstroke brisk and symmetric, no bruits, no thyromegaly LYMPHATICS:  No cervical, inguinal adenopathy LUNGS:  Clear to auscultation bilaterally BACK:  No CVA tenderness CHEST:  ICD  HEART:  PMI not displaced or sustained,S1 and S2 within normal limits, no S3, no S4, no clicks, no rubs, no murmurs ABD:  Flat, positive bowel sounds normal in frequency in pitch, no bruits, no rebound, no guarding, no midline pulsatile mass, no hepatomegaly, no splenomegaly EXT:  2 plus pulses throughout, moderate edema, no cyanosis no clubbing SKIN:  No rashes no nodules NEURO:  Cranial nerves II through XII grossly intact, motor grossly intact throughout PSYCH:  Cognitively intact, oriented to person place and time    EKG:  EKG is not ordered today. The ekg ordered 12/13/16 demonstrates sinus rhythm, rate 57, left axis deviation, interventricular conduction delay, first-degree AV block, no acute ST-T wave changes.  Recent Labs: 08/30/2016: Magnesium 2.2 11/17/2016: ALT 10; BUN 20; Creatinine, Ser 1.24; Hemoglobin 11.4; Platelets 293; Potassium 4.3; Sodium 137; TSH 2.560    Lipid Panel    Component Value Date/Time   CHOL 151 11/17/2016 1142   CHOL 140 07/31/2012 1019   TRIG 113 11/17/2016 1142   TRIG 115 06/07/2015 1111   TRIG 161 (H) 07/31/2012 1019   HDL 61 11/17/2016 1142   HDL 68 06/07/2015 1111   HDL 43 07/31/2012 1019   CHOLHDL 2.5 11/17/2016 1142   CHOLHDL 2.8 09/02/2010 0629   VLDL 34 09/02/2010 0629   LDLCALC 67 11/17/2016 1142   LDLCALC 65 09/09/2013 0841   LDLCALC 65 07/31/2012 1019      Wt Readings from Last 3 Encounters:  12/13/16 165 lb (74.8 kg)  11/17/16 167 lb  (75.8 kg)  11/01/16 166 lb (75.3 kg)  Other studies Reviewed: Additional studies/ records that were reviewed today include: Labs drawn yesterday. Review of the above records demonstrates:  Please see elsewhere in the note.     ASSESSMENT AND PLAN:   CAD:  The patient has no new sypmtoms.  No further cardiovascular testing is indicated.  We will continue with aggressive risk reduction and meds as listed.  ATRIAL FIB:  Ms. GLORINE HANRATTY has a CHA2DS2 - VASc score of 6.   It sounds like she is having increasing symptoms related to this.  I am going to go ahead and start amiodarone.  She has had reasonable liver enzymes.  She has no contraindication to this other than the fact that she might space more.  She might need an upgrade to her device and I will get her an appointment next week with Dr. Sallyanne Kuster to discuss this.  For now she can continue the current dose of beta blocker although this dose might need to be decreased eventually.  She will continue with anticoagulation.  We talked about taking PRN 1/2 dose of her beta blocker for severe symptoms.    VT:  She had no firings of her device.  She has a single chamber device.  This might need to be changed out as above.   Current medicines are reviewed at length with the patient today.  The patient does not have concerns regarding medicines.  The following changes have been made:  See above.   Labs/ tests ordered today include: None No orders of the defined types were placed in this encounter.    Disposition:   FU with Dr. Sallyanne Kuster next week.      Signed, Minus Breeding, MD  12/13/2016 9:31 PM    [place]

## 2016-12-13 NOTE — Progress Notes (Signed)
Subjective:    Patient ID: [name], female    DOB: October 23, 1931, 83 y.o.   MRN: [id number]  HPI Patient here today for weakness and palpitations.  The patient has had several episodes of weakness this is been going on for 4 days.  When she has the spell she feels like she is almost going to pass out and it happened again this morning while she was bathing.  With the weakness or palpitations and a hot feeling.  Was told to go the emergency room from here yesterday and went but there was a 5-hour wait and she did not stay.  She is complaining with some swelling and took her first fluid pill yesterday the first in a long time.  She has a rash on her left leg.  She was seen by the cardiologist about a month ago and recommended that she have a new pacemaker placed but she has not heard back from a cardiologist at this point.  Her vital signs are stable.  Says that these spells are occurring 2-3 times a day lasting as long as 3-4 minutes at a time.  They are not related to activity.  The spell she had yesterday she was just sitting at the table when it happened.  At the time she has the spells, she feels a weight or pressure in her chest and feels like that the sensation of feeling bad goes all over her body.  In general these are not necessarily associated with activity.  She did take the fluid pill yesterday.  We will discussed the patient's findings with her cardiologist and follow his recommendations.    Patient Active Problem List   Diagnosis Date Noted  . History of drug-induced prolonged QT interval with torsade de pointes 11/01/2016  . Iron deficiency anemia 10/30/2016  . Long term current use of anticoagulant 02/17/2016  . Near syncope 05/22/2015  . Diabetes mellitus type 2, diet-controlled ([place]) 09/14/2014  . ICD (implantable cardioverter-defibrillator) battery depletion 01/20/2014  . Abnormal nuclear stress test, 12/30/13 12/31/2013  . CAD in native artery 12/31/2013  . Unstable  angina, neg MI, cath stable.maybe GI 12/30/2013  . Paroxysmal atrial fibrillation, chads2 Vasc2 score of 5, on eliquis 10/16/2013  . Catecholaminergic polymorphic ventricular tachycardia ([place]) 10/16/2013  . Ureteral stone with hydronephrosis 05/20/2013  . Metabolic syndrome 09/73/5329  . Hypotension, meds stopped this admission 07/29/2011  . Orthostatic hypotension 07/27/2011  . Chest pain, r/o cardiac, negative MI 07/25/2011  . Chronic sinus bradycardia 07/25/2011  . Automatic implantable cardioverter-defibrillator in situ 07/25/2011  . CKD (chronic kidney disease) stage 3, GFR 30-59 ml/min ([facility]) 07/25/2011    Class: Chronic  . History of DVT in the past, on eliquis now 07/25/2011    Class: History of  . Nephrolithiasis, just saw Dr [name] 07/25/2011    Class: History of  . DYSPHAGIA 03/24/2010  . Chronic ischemic heart disease 12/03/2007  . RECTAL BLEEDING 12/03/2007  . Irritable bowel syndrome with diarrhea 12/03/2007  . EPIGASTRIC PAIN 12/03/2007  . Hyperlipidemia 12/02/2007    Class: History of  . EXTERNAL HEMORRHOIDS 12/02/2007    Class: History of  . ESOPHAGITIS 12/02/2007  . GASTRITIS 12/02/2007  . COLITIS 12/02/2007  . DIVERTICULOSIS, COLON 12/02/2007  . ARTHRITIS 12/02/2007    Class: Chronic   Outpatient Encounter Medications as of 12/13/2016  Medication Sig  . amLODipine (NORVASC) 5 MG tablet Take 1 tablet by mouth daily.  . Calcium Citrate-Vitamin D (CITRACAL + D PO) Take 1  tablet by mouth daily.  . cefdinir (OMNICEF) 300 MG capsule Take 1 capsule (300 mg total) 2 (two) times daily by mouth. 1 po BID  . Cholecalciferol (VITAMIN D) 1000 UNITS capsule Take 2,000 Units by mouth daily.   Marland Kitchen ELIQUIS 5 MG TABS tablet Take 1 tablet (5 mg total) by mouth 2 (two) times daily.  . ferrous sulfate 325 (65 FE) MG tablet Take 1 tablet (325 mg total) by mouth 2 (two) times daily with a meal.  . gabapentin (NEURONTIN) 100 MG capsule TAKE 1 CAPSULE AT BEDTIME  . lisinopril  (PRINIVIL,ZESTRIL) 20 MG tablet TAKE  (1)  TABLET TWICE A DAY.  . methocarbamol (ROBAXIN) 500 MG tablet Take 1 tablet (500 mg total) by mouth 2 (two) times daily.  . metoprolol tartrate (LOPRESSOR) 50 MG tablet Take 50 mg by mouth 2 (two) times daily.  . Multiple Vitamins-Minerals (CENTRUM SILVER ULTRA WOMENS PO) Take 1 tablet by mouth daily.  . nitroGLYCERIN (NITROSTAT) 0.4 MG SL tablet Place 1 tablet (0.4 mg total) under the tongue every 5 (five) minutes as needed for chest pain.  . pantoprazole (PROTONIX) 40 MG tablet TKAE 1 TABLET 2 TIMES A DAY  . PARoxetine (PAXIL) 20 MG tablet Take 1 tablet (20 mg total) by mouth every morning.  . rosuvastatin (CRESTOR) 20 MG tablet Take 1 tablet (20 mg total) by mouth daily. (Patient taking differently: Take 10 mg by mouth daily. )  . torsemide (DEMADEX) 20 MG tablet Take 20 mg by mouth daily as needed (edema).  . triamcinolone (KENALOG) 0.025 % ointment Apply 1 application topically 2 (two) times daily. On lower back rash   No facility-administered encounter medications on file as of 12/13/2016.       Review of Systems  Constitutional: Positive for fatigue.  HENT: Negative.   Eyes: Negative.   Respiratory: Negative.   Cardiovascular: Positive for palpitations and leg swelling.  Gastrointestinal: Negative.   Endocrine: Negative.        "hot sensation" at times  Genitourinary: Negative.   Musculoskeletal: Negative.   Skin: Positive for rash (left lower leg rash ).  Allergic/Immunologic: Negative.   Neurological: Positive for weakness and light-headedness.  Hematological: Negative.   Psychiatric/Behavioral: Negative.        Objective:   Physical Exam  Constitutional: She is oriented to person, place, and time. She appears well-developed and well-nourished. No distress.  HENT:  Head: Normocephalic and atraumatic.  Eyes: Conjunctivae and EOM are normal. Pupils are equal, round, and reactive to light. Right eye exhibits no discharge. Left eye  exhibits no discharge. No scleral icterus.  Neck: Normal range of motion. Neck supple. No thyromegaly present.  Cardiovascular: Normal rate and normal heart sounds.  No murmur heard. Heart is irregular irregular at about 60/min  Pulmonary/Chest: Effort normal and breath sounds normal. No respiratory distress. She has no wheezes. She has no rales.  Clear anteriorly and posteriorly  Abdominal: Soft. Bowel sounds are normal. There is tenderness. There is no rebound and no guarding.  Generalized abdominal tenderness without liver or spleen enlargement  Musculoskeletal: She exhibits no edema.  The patient is somewhat weak with standing and uses a cane for ambulation  Neurological: She is alert and oriented to person, place, and time. No cranial nerve deficit.  Skin: Skin is warm and dry.  Psychiatric: She has a normal mood and affect. Her behavior is normal. Judgment and thought content normal.  Nursing note and vitals reviewed.  BP 136/75 (BP Location: Left Arm)  Pulse (!) 57   Temp (!) 96.7 F (35.9 C) (Oral)   Ht 5\' 2"  (1.575 m)   Wt 165 lb (74.8 kg)   BMI 30.18 kg/m   EKG with results pending=== bradycardia with first-degree AV block left axis anterior fascicular block and old inferior MI.      Assessment & Plan:  1. Weakness - EKG 12-Lead  2. Near syncope - EKG 12-Lead  3. Heart palpitations - EKG 12-Lead  4. Paroxysmal atrial fibrillation, chads2 Vasc2 score of 5, on eliquis -Appointment with cardiology as soon as this can be arranged  5. Automatic implantable cardioverter-defibrillator in situ -Appointment with cardiology for follow-up  Patient Instructions  Continue with cream prescribed by Dr. Evette Doffing for leg rash We will arrange for you to see the cardiologist to further follow-up on the previous conversation with him regarding replacing the pacemaker. This is especially important because of the occurrence of symptoms that you are having with your heart. We will  get a BNP BMP and CBC today along with a urinalysis.  Arrie Senate MD

## 2016-12-13 NOTE — Patient Instructions (Signed)
Continue with cream prescribed by Dr. Evette Doffing for leg rash We will arrange for you to see the cardiologist to further follow-up on the previous conversation with him regarding replacing the pacemaker. This is especially important because of the occurrence of symptoms that you are having with your heart. We will get a BNP BMP and CBC today along with a urinalysis.

## 2016-12-14 ENCOUNTER — Other Ambulatory Visit: Payer: Self-pay

## 2016-12-14 ENCOUNTER — Encounter: Payer: Self-pay | Admitting: Cardiology

## 2016-12-14 ENCOUNTER — Ambulatory Visit (INDEPENDENT_AMBULATORY_CARE_PROVIDER_SITE_OTHER): Payer: Medicare Other | Admitting: Cardiology

## 2016-12-14 VITALS — BP 116/70 | HR 66 | Ht 63.0 in | Wt 166.0 lb

## 2016-12-14 DIAGNOSIS — I480 Paroxysmal atrial fibrillation: Secondary | ICD-10-CM

## 2016-12-14 DIAGNOSIS — I2510 Atherosclerotic heart disease of native coronary artery without angina pectoris: Secondary | ICD-10-CM | POA: Diagnosis not present

## 2016-12-14 DIAGNOSIS — I259 Chronic ischemic heart disease, unspecified: Secondary | ICD-10-CM

## 2016-12-14 LAB — BMP8+EGFR
BUN/Creatinine Ratio: 14 (ref 12–28)
BUN: 17 mg/dL (ref 8–27)
CALCIUM: 9.5 mg/dL (ref 8.7–10.3)
CHLORIDE: 101 mmol/L (ref 96–106)
CO2: 25 mmol/L (ref 20–29)
Creatinine, Ser: 1.21 mg/dL — ABNORMAL HIGH (ref 0.57–1.00)
GFR calc non Af Amer: 41 mL/min/{1.73_m2} — ABNORMAL LOW (ref >59)
GFR, EST AFRICAN AMERICAN: 48 mL/min/{1.73_m2} — AB (ref >59)
Glucose: 178 mg/dL — ABNORMAL HIGH (ref 65–99)
Potassium: 4.1 mmol/L (ref 3.5–5.2)
Sodium: 140 mmol/L (ref 134–144)

## 2016-12-14 LAB — CBC WITH DIFFERENTIAL/PLATELET
Basophils Absolute: 0 10*3/uL (ref 0.0–0.2)
Basos: 1 %
EOS (ABSOLUTE): 0.5 10*3/uL — ABNORMAL HIGH (ref 0.0–0.4)
EOS: 9 %
HEMATOCRIT: 41.6 % (ref 34.0–46.6)
HEMOGLOBIN: 13.1 g/dL (ref 11.1–15.9)
IMMATURE GRANS (ABS): 0 10*3/uL (ref 0.0–0.1)
IMMATURE GRANULOCYTES: 0 %
LYMPHS: 28 %
Lymphocytes Absolute: 1.7 10*3/uL (ref 0.7–3.1)
MCH: 27.8 pg (ref 26.6–33.0)
MCHC: 31.5 g/dL (ref 31.5–35.7)
MCV: 88 fL (ref 79–97)
MONOCYTES: 11 %
MONOS ABS: 0.7 10*3/uL (ref 0.1–0.9)
NEUTROS PCT: 51 %
Neutrophils Absolute: 3.1 10*3/uL (ref 1.4–7.0)
PLATELETS: 275 10*3/uL (ref 150–379)
RBC: 4.72 x10E6/uL (ref 3.77–5.28)
RDW: 20.3 % — ABNORMAL HIGH (ref 12.3–15.4)
WBC: 5.9 10*3/uL (ref 3.4–10.8)

## 2016-12-14 LAB — BRAIN NATRIURETIC PEPTIDE: BNP: 100.4 pg/mL — ABNORMAL HIGH (ref 0.0–100.0)

## 2016-12-14 MED ORDER — AMIODARONE HCL 200 MG PO TABS: 200.0000 mg | ORAL_TABLET | Freq: Two times a day (BID) | ORAL | 6 refills | Status: DC

## 2016-12-14 NOTE — Patient Instructions (Signed)
Medication Instructions:   Begin Amiodarone 200mg  twice a day.  Continue all other medications.    Labwork: none  Testing/Procedures: none  Follow-Up: Dr. Sallyanne Kuster - Monday, 12/18/2016 at [facility] office.    Any Other Special Instructions Will Be Listed Below (If Applicable).  If you need a refill on your cardiac medications before your next appointment, please call your pharmacy.

## 2016-12-14 NOTE — Progress Notes (Signed)
Thank you so much for seeing this patient today.

## 2016-12-18 ENCOUNTER — Ambulatory Visit (INDEPENDENT_AMBULATORY_CARE_PROVIDER_SITE_OTHER): Payer: Medicare Other | Admitting: Cardiovascular Disease

## 2016-12-18 ENCOUNTER — Encounter: Payer: Self-pay | Admitting: Cardiovascular Disease

## 2016-12-18 VITALS — BP 131/66 | HR 55 | Ht 63.0 in | Wt 168.6 lb

## 2016-12-18 DIAGNOSIS — E1159 Type 2 diabetes mellitus with other circulatory complications: Secondary | ICD-10-CM | POA: Diagnosis not present

## 2016-12-18 DIAGNOSIS — D500 Iron deficiency anemia secondary to blood loss (chronic): Secondary | ICD-10-CM | POA: Diagnosis not present

## 2016-12-18 DIAGNOSIS — N183 Chronic kidney disease, stage 3 unspecified: Secondary | ICD-10-CM

## 2016-12-18 DIAGNOSIS — Z7901 Long term (current) use of anticoagulants: Secondary | ICD-10-CM | POA: Diagnosis not present

## 2016-12-18 DIAGNOSIS — I472 Ventricular tachycardia: Secondary | ICD-10-CM

## 2016-12-18 DIAGNOSIS — I2510 Atherosclerotic heart disease of native coronary artery without angina pectoris: Secondary | ICD-10-CM

## 2016-12-18 DIAGNOSIS — I480 Paroxysmal atrial fibrillation: Secondary | ICD-10-CM

## 2016-12-18 DIAGNOSIS — Z95810 Presence of automatic (implantable) cardiac defibrillator: Secondary | ICD-10-CM

## 2016-12-18 DIAGNOSIS — I951 Orthostatic hypotension: Secondary | ICD-10-CM

## 2016-12-18 DIAGNOSIS — I259 Chronic ischemic heart disease, unspecified: Secondary | ICD-10-CM

## 2016-12-18 DIAGNOSIS — I4729 Other ventricular tachycardia: Secondary | ICD-10-CM

## 2016-12-18 DIAGNOSIS — E7800 Pure hypercholesterolemia, unspecified: Secondary | ICD-10-CM | POA: Diagnosis not present

## 2016-12-18 NOTE — Progress Notes (Signed)
. Patient ID: [name], female   DOB: [birth year], 83 y.o.   MRN: [id number]    Cardiology Office Note    Date:  12/19/2016   ID:  [name], [name] Mar 19, 1931, MRN [id number]  PCP:  Chipper Herb, MD  Cardiologist:   Sanda Klein, MD   No chief complaint on file.   History of Present Illness:  [name] is a 83 y.o. 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  She continues to have abrupt episodes of weakness and palpitations.  She feels better if she sits down or lies down.  She thinks these episodes are present atrial fibrillation.  He saw Dr. Percival Spanish in the office a few days ago and he recommended initiation of amiodarone therapy, as had been suggested by Dr. Caryl Comes a few weeks earlier.  She has not yet started the medication.  Interrogation of her defibrillator shows limited diagnostic information since it is a single-chamber device.  There is however clear evidence of a population of heart beats in the 100-130 bpm which could represent atrial fibrillation with rapid ventricular response.  Although the overall presence of these faster rhythms has decreased since her last appointment, they are clearly still present.  She is fully anticoagulated with Eliquis and has not had any bleeding issues.  Anemia resolved with iron supplementation.  Just a few days ago her hemoglobin is 13.1.  On the same set of labs, all of her electrolytes were normal.  She has not had  frank syncope and she denies angina pectoris or dyspnea at rest, outside of the brief episodes of palpitations.  She does not have orthopnea or PND.  She denies edema or claudication.  Previous workup with upper endoscopy a few months ago showed no abnormalities. She has not had a colonoscopy in 5 years, but her gastroenterologist, Dr. Henrene Pastor stated he would not perform one due to her age. She is on anticoagulation with Eliquis and has not interrupted the medication.   Shee was on treatment with disopyramide for years for her history of catecholaminergic polymorphic VT, but this medication had to be gradually discontinued due to side effects, especially orthostatic hypotension and dry mouth.  In the past she developed torsades de pointes with sotalol and diarrhea with quinidine.  Interrogation of her defibrillator no evidence of ventricular tachycardia and no ventricular pacing. She has a Paramedic. Her generator is still essentially at beginning of life since implantation in 2016 as a generator changeout.    Past Medical History:  Diagnosis Date  . AICD (automatic cardioverter/defibrillator) present   . Allergy    SESONAL  . Anxiety   . ARTHRITIS   . Arthritis   . Atrial fibrillation ([place])   . CAD (coronary artery disease)    a. history of cardiac arrest 1993;  b. s/p LAD/LCX stenting in 2003;  c. 07/2011 Cath: LM nl, LAD patent stent, D1 80ost, LCX patent stent, RCA min irregs;  d. 04/2012 MV: EF 66%, no ishcemia;  e. 12/2013 Echo: Ef 55%, no rwma, Gr 1 DD, Lurlean Nanny  AI/MR, mildly dil LA;  f. 12/2013 Lexi MV: intermediate risk - apical ischemia and inf/infsept fixed defect, ? artifactual.  . CAD in native artery 12/31/2013   Previous stents to LAD and Ramus patent on cath today 12/31/13 also There is severe disease in the ostial first diagonal which is unchanged from most recent cardiac catheterization. The right coronary artery could not be engaged selectively but  nonselective angiography showed no significant disease in the proximal and midsegment.     . Cataract    DENIES  . Chronic back pain   . Chronic sinus bradycardia   . CKD (chronic kidney disease), stage III ([place])   . Clotting disorder (HCC)    DVT  . Diabetes mellitus without complication ([place])    DENIES  . Diverticulosis of colon (without mention of hemorrhage) 2007   Colonoscopy   . Esophageal stricture    a. 2012 s/p dil.  . Esophagitis, unspecified    a. 2012 EGD  . EXTERNAL HEMORRHOIDS   . GERD (gastroesophageal reflux disease)    omeprazole  . H/O hiatal hernia   . Hiatal hernia    a. 2012 EGD.  Marland Kitchen History of DVT in the past, not on Coumadin now    left  leg  . HYPERCHOLESTEROLEMIA   . Hypertension   . ICD (implantable cardiac defibrillator) in place    a. s/p initial ICD in 1993 in setting of cardiac arrest;  b. 01/2007 gen change: Guidant T135 Vitality DS VR single lead ICD.  . Macular degeneration    gets injecton in eye every 5 weeks- last injection - 05/03/2013   . Myocardial infarction ([place]) 1993  . Nephrolithiasis, just saw Dr [name]   . Orthostatic hypotension   . Peripheral vascular disease ([place])    ???  . Unspecified gastritis and gastroduodenitis without mention of hemorrhage    a. 2003 EGD->not noted on 2012 EGD.    Past Surgical History:  Procedure Laterality Date  . CARDIAC CATHETERIZATION    . CARDIAC CATHETERIZATION  01/01/15   patent stents -there is severe disease in ostial 1st diag but without change.   Marland Kitchen CARDIAC DEFIBRILLATOR PLACEMENT  02/05/2007   Guidant  . CARDIOVASCULAR STRESS TEST  12/30/2013   abnormal  . COLONOSCOPY    . coronary stents     . CYSTOSCOPY WITH URETEROSCOPY Right 05/20/2013   Procedure: CYSTOSCOPY, RIGHT URETEROSCOPY STONE EXTRACTION, Insertion of right DOUBLE J STENT ;  Surgeon: Irine Seal, MD;  Location: [facility];  Service: Urology;  Laterality: Right;  . CYSTOSCOPY WITH URETEROSCOPY AND STENT PLACEMENT N/A 06/03/2013     Procedure: SECOND LOOK CYSTOSCOPY WITH URETEROSCOPY  HOLMIUM LASER LITHO AND STONE EXTRACTION Sammie Bench ;  Surgeon: Malka So, MD;  Location: [facility];  Service: Urology;  Laterality: N/A;  . GIVENS CAPSULE STUDY N/A 10/30/2016   Procedure: GIVENS CAPSULE STUDY;  Surgeon: Irene Shipper, MD;  Location: [facility];  Service: Endoscopy;  Laterality: N/A;  NEEDS TO BE ADMITTED FOR OBSERVATION PT. HAS DEFIB  . HEMORROIDECTOMY  80's  . HOLMIUM LASER APPLICATION Right 01/21/1094   Procedure: HOLMIUM LASER APPLICATION;  Surgeon: Irine Seal, MD;  Location: [facility];  Service: Urology;  Laterality: Right;  . HYSTEROSCOPY W/D&C  09/02/2010   Procedure: DILATATION AND CURETTAGE (D&C) /HYSTEROSCOPY;  Surgeon: Margarette Asal;  Location: [facility];  Service: Gynecology;  Laterality: N/A;  Dilation and Curettage with Hysteroscopy and Polypectomy  . IMPLANTABLE CARDIOVERTER DEFIBRILLATOR (ICD) GENERATOR CHANGE N/A 02/10/2014  Procedure: ICD GENERATOR CHANGE;  Surgeon: Sanda Klein, MD;  Location: [facility] CATH LAB;  Service: Cardiovascular;  Laterality: N/A;  . LEFT HEART CATHETERIZATION WITH CORONARY ANGIOGRAM N/A 07/28/2011   Procedure: LEFT HEART CATHETERIZATION WITH CORONARY ANGIOGRAM;  Surgeon: Lorretta Harp, MD;  Location: [facility] C[facility] LAB;  Service: Cardiovascular;  Laterality: N/A;  . LEFT HEART CATHETERIZATION WITH CORONARY ANGIOGRAM N/A 12/31/2013   Procedure: LEFT HEART CATHETERIZATION WITH CORONARY ANGIOGRAM;  Surgeon: Wellington Hampshire, MD;  Location: [facility] CATH LAB;  Service: Cardiovascular;  Laterality: N/A;    Current Medications: Outpatient Medications Prior to Visit  Medication Sig Dispense Refill  . amiodarone (PACERONE) 200 MG tablet Take 1 tablet (200 mg total) by mouth 2 (two) times daily. 60 tablet 6  . amLODipine (NORVASC) 5 MG tablet Take 1 tablet by mouth daily.    . Calcium Citrate-Vitamin D (CITRACAL + D PO) Take 1 tablet by mouth daily.    . cefdinir (OMNICEF) 300 MG capsule Take 1 capsule  (300 mg total) 2 (two) times daily by mouth. 1 po BID 20 capsule 0  . Cholecalciferol (VITAMIN D) 1000 UNITS capsule Take 2,000 Units by mouth daily.     Marland Kitchen ELIQUIS 5 MG TABS tablet Take 1 tablet (5 mg total) by mouth 2 (two) times daily. 180 tablet 1  . ferrous sulfate 325 (65 FE) MG tablet Take 1 tablet (325 mg total) by mouth 2 (two) times daily with a meal. 180 tablet 3  . gabapentin (NEURONTIN) 100 MG capsule TAKE 1 CAPSULE AT BEDTIME 90 capsule 0  . lisinopril (PRINIVIL,ZESTRIL) 20 MG tablet TAKE  (1)  TABLET TWICE A DAY. 180 tablet 1  . methocarbamol (ROBAXIN) 500 MG tablet Take 1 tablet (500 mg total) by mouth 2 (two) times daily. 20 tablet 0  . metoprolol tartrate (LOPRESSOR) 50 MG tablet Take 50 mg by mouth 2 (two) times daily.    . Multiple Vitamins-Minerals (CENTRUM SILVER ULTRA WOMENS PO) Take 1 tablet by mouth daily.    . nitroGLYCERIN (NITROSTAT) 0.4 MG SL tablet Place 1 tablet (0.4 mg total) under the tongue every 5 (five) minutes as needed for chest pain. 25 tablet 4  . pantoprazole (PROTONIX) 40 MG tablet TKAE 1 TABLET 2 TIMES A DAY 60 tablet 6  . PARoxetine (PAXIL) 20 MG tablet Take 1 tablet (20 mg total) by mouth every morning. 90 tablet 0  . rosuvastatin (CRESTOR) 20 MG tablet Take 1 tablet (20 mg total) by mouth daily. (Patient taking differently: Take 10 mg by mouth daily. ) 90 tablet 3  . torsemide (DEMADEX) 20 MG tablet Take 20 mg by mouth daily as needed (edema).    . triamcinolone (KENALOG) 0.025 % ointment Apply 1 application topically 2 (two) times daily. On lower back rash 80 g 0   No facility-administered medications prior to visit.      Allergies:   Sotalol; Bactrim [sulfamethoxazole-trimethoprim]; Actos [pioglitazone]; Adhesive [tape]; Contrast media [iodinated diagnostic agents]; Latex; and Lipitor [atorvastatin]   Social History   Socioeconomic History  . Marital status: Widowed    Spouse name: None  . Number of children: None  . Years of education: None   . Highest education level: None  Social Needs  . Financial resource strain: None  . Food insecurity - worry: None  . Food insecurity - inability: None  . Transportation needs - medical: None  . Transportation needs - non-medical: None  Occupational History  . None  Tobacco Use  . Smoking status: Never  Smoker  . Smokeless tobacco: Never Used  Substance and Sexual Activity  . Alcohol use: No  . Drug use: No  . Sexual activity: No    Birth control/protection: Post-menopausal  Other Topics Concern  . None  Social History Narrative  . None     Family History:  The patient's family history includes Asthma in her brother; Atrial fibrillation in her sister; Cancer in her daughter; Colon polyps in her daughter; Diabetes in her daughter; Heart attack in her brother and father; Heart disease in her brother; Hyperlipidemia in her sister; Macular degeneration in her sister; Osteoporosis in her sister; Other in her father and mother; Stroke in her mother, sister, and sister; Uterine cancer in her sister.   ROS:   Please see the history of present illness.    ROS All other systems reviewed and are negative.   PHYSICAL EXAM:   VS:  BP 131/66   Pulse (!) 55   Ht 5\' 3"  (1.6 m)   Wt 168 lb 9.6 oz (76.5 kg)   SpO2 100%   BMI 29.87 kg/m     General: Alert, oriented x3, no distress, overweight Head: no evidence of trauma, PERRL, EOMI, no exophtalmos or lid lag, no myxedema, no xanthelasma; normal ears, nose and oropharynx Neck: normal jugular venous pulsations and no hepatojugular reflux; brisk carotid pulses without delay and no carotid bruits Chest: clear to auscultation, no signs of consolidation by percussion or palpation, normal fremitus, symmetrical and full respiratory excursions.  Healthy left subclavian defibrillator site Cardiovascular: normal position and quality of the apical impulse, regular rhythm, normal first and second heart sounds, no murmurs, rubs or gallops Abdomen: no  tenderness or distention, no masses by palpation, no abnormal pulsatility or arterial bruits, normal bowel sounds, no hepatosplenomegaly Extremities: no clubbing, cyanosis or edema; 2+ radial, ulnar and brachial pulses bilaterally; 2+ right femoral, posterior tibial and dorsalis pedis pulses; 2+ left femoral, posterior tibial and dorsalis pedis pulses; no subclavian or femoral bruits Neurological: grossly nonfocal Psych: Normal mood and affect    Wt Readings from Last 3 Encounters:  12/18/16 168 lb 9.6 oz (76.5 kg)  12/14/16 166 lb (75.3 kg)  12/13/16 165 lb (74.8 kg)      Studies/Labs Reviewed:   EKG:  EKG is not ordered today.    Recent Labs: 08/30/2016: Magnesium 2.2 11/17/2016: ALT 10; TSH 2.560 12/13/2016: BNP 100.4; BUN 17; Creatinine, Ser 1.21; Hemoglobin 13.1; Platelets 275; Potassium 4.1; Sodium 140   Lipid Panel    Component Value Date/Time   CHOL 151 11/17/2016 1142   CHOL 140 07/31/2012 1019   TRIG 113 11/17/2016 1142   TRIG 115 06/07/2015 1111   TRIG 161 (H) 07/31/2012 1019   HDL 61 11/17/2016 1142   HDL 68 06/07/2015 1111   HDL 43 07/31/2012 1019   CHOLHDL 2.5 11/17/2016 1142   CHOLHDL 2.8 09/02/2010 0629   VLDL 34 09/02/2010 0629   LDLCALC 67 11/17/2016 1142   LDLCALC 65 09/09/2013 0841   LDLCALC 65 07/31/2012 1019     ASSESSMENT:    1. Paroxysmal atrial fibrillation (HCC)   2. Long term current use of anticoagulant   3. Iron deficiency anemia due to chronic blood loss   4. Catecholaminergic polymorphic ventricular tachycardia ([place])   5. Coronary artery disease involving native coronary artery of native heart without angina pectoris   6. Orthostatic hypotension   7. Chronic kidney disease, stage 3 (HCC)   8. ICD (implantable cardioverter-defibrillator) in place   9.  Hypercholesterolemia   10. Controlled type 2 diabetes mellitus with other circulatory complication, unspecified whether long term insulin use (HCC)      PLAN:  In order of problems  listed above:  1. AFib: It is reasonable to suspect that her symptoms may be due to atrial fibrillation, but so far we have no undeniable evidence for this.  I have recommended that she wear a monitor and keep a detailed record of her episodes of weakness.  She has not had unacceptable episodes of rapid ventricular response.  She is appropriately anticoagulated, CHADSVasc 4 (age 27, gender, DM). No history of TIA or stroke or other systemic embolism. She believes that she is continuing to have episodes of arrhythmia and this would be supported by her heart rate histogram distribution.  If her episodes of palpitations and weakness are not very severe, I would simply wait them out, relaxing in a quiet room, lying down.  If at any point she has syncope or near syncope or if she has extreme shortness of breath she should request emergency medical assistance.  As outlined in Dr. Olin Pia note from October 17 there are no good antiarrhythmic choices due to her history of torsades with sotalol (this also precludes use of dofetilide), her history of coronary artery disease which precludes use of flecainide and the side effects she had with disopyramide. 2. Eliquis: Microcytic anemia resolved simply with iron supplementation without interruption of the anticoagulant.  EGD did not show any high risk findings.  Her gastroenterologist thinks she does not really need a colonoscopy. 3. Iron deficiency anemia: Resolved.  RDW is still high.  Would continue iron supplements for at least 3 months following resolution of anemia to replenish bone marrow source. 4. VT: No events and no defibrillator discharges since device implantation in 1993.  And no recurrence since we stopped disopyramide about a year ago. 5. CAD: She does not have angina pectoris and has not required revascularization since 2003.  Her last assessment was a cardiac catheterization 2015 that did not show any significant coronary stenoses. 6. Orthostatic  hypotension:  this seems to have resolved after discontinuing disopyramide 7. DM: Diet-controlled 8. CKD: last GFR around 41 baseline creatinine roughly 1.2 mL/min, stable.  9. ICD: Normal device function. Continue remote downloads every 3 months and at least yearly office visits. 10. HLP: She is at target LDL<70, on statin.  Medication Adjustments/Labs and Tests Ordered: Current medicines are reviewed at length with the patient today.  Concerns regarding medicines are outlined above.  Medication changes, Labs and Tests ordered today are listed in the Patient Instructions below. Patient Instructions  Dr Sallyanne Kuster recommends that you continue on your current medications as directed. Please refer to the Current Medication list given to you today.  Your physician has recommended that you wear an 30-day event monitor. Event monitors are medical devices that record the heart's electrical activity. Doctors most often [place] these monitors to diagnose arrhythmias. Arrhythmias are problems with the speed or rhythm of the heartbeat. The monitor is a small, portable device. You can wear one while you do your normal daily activities. This is usually used to diagnose what is causing palpitations/syncope (passing out). >>This will be placed at our [facility] location - [address], [address]  Dr Sallyanne Kuster recommends that you schedule a follow-up appointment in FEBRUARY 2019.  If you need a refill on your cardiac medications before your next appointment, please call your pharmacy.    Signed, Sanda Klein, MD  12/19/2016 4:36 PM    [facility] [address], [place], [place]  [address] Phone: [phone]; Fax: [phone]

## 2016-12-18 NOTE — Patient Instructions (Addendum)
Dr Sallyanne Kuster recommends that you continue on your current medications as directed. Please refer to the Current Medication list given to you today.  Your physician has recommended that you wear an 30-day event monitor. Event monitors are medical devices that record the heart's electrical activity. Doctors most often [place] these monitors to diagnose arrhythmias. Arrhythmias are problems with the speed or rhythm of the heartbeat. The monitor is a small, portable device. You can wear one while you do your normal daily activities. This is usually used to diagnose what is causing palpitations/syncope (passing out). >>This will be placed at our [facility] location - [address], [address]  Dr Sallyanne Kuster recommends that you schedule a follow-up appointment in FEBRUARY 2019.  If you need a refill on your cardiac medications before your next appointment, please call your pharmacy.

## 2016-12-20 ENCOUNTER — Ambulatory Visit: Payer: Medicare Other | Admitting: *Deleted

## 2016-12-29 DIAGNOSIS — H353211 Exudative age-related macular degeneration, right eye, with active choroidal neovascularization: Secondary | ICD-10-CM | POA: Diagnosis not present

## 2016-12-29 DIAGNOSIS — H353212 Exudative age-related macular degeneration, right eye, with inactive choroidal neovascularization: Secondary | ICD-10-CM | POA: Diagnosis not present

## 2016-12-29 DIAGNOSIS — H2512 Age-related nuclear cataract, left eye: Secondary | ICD-10-CM | POA: Diagnosis not present

## 2016-12-29 DIAGNOSIS — H353221 Exudative age-related macular degeneration, left eye, with active choroidal neovascularization: Secondary | ICD-10-CM | POA: Diagnosis not present

## 2017-01-01 ENCOUNTER — Ambulatory Visit
Admission: RE | Admit: 2017-01-01 | Discharge: 2017-01-01 | Disposition: A | Discharge status: Home / Self Care | Payer: Medicare Other | Source: Ambulatory Visit | Attending: Family Medicine | Admitting: Family Medicine

## 2017-01-01 DIAGNOSIS — Z1231 Encounter for screening mammogram for malignant neoplasm of breast: Secondary | ICD-10-CM | POA: Diagnosis not present

## 2017-01-01 DIAGNOSIS — Z139 Encounter for screening, unspecified: Secondary | ICD-10-CM

## 2017-01-10 ENCOUNTER — Encounter: Payer: Medicare Other | Admitting: Cardiovascular Disease

## 2017-01-22 ENCOUNTER — Other Ambulatory Visit: Payer: Self-pay | Admitting: Family Medicine

## 2017-01-22 ENCOUNTER — Telehealth: Payer: Self-pay | Admitting: Cardiovascular Disease

## 2017-01-22 ENCOUNTER — Other Ambulatory Visit: Payer: Self-pay | Admitting: Cardiovascular Disease

## 2017-01-22 ENCOUNTER — Other Ambulatory Visit: Payer: Self-pay | Admitting: Pediatrics

## 2017-01-22 MED ORDER — METOPROLOL TARTRATE 50 MG PO TABS: 50.0000 mg | ORAL_TABLET | Freq: Two times a day (BID) | ORAL | 3 refills | Status: DC

## 2017-01-22 MED ORDER — LISINOPRIL 20 MG PO TABS: 20.0000 mg | ORAL_TABLET | Freq: Two times a day (BID) | ORAL | 3 refills | Status: DC

## 2017-01-22 NOTE — Telephone Encounter (Signed)
Rx(s) sent to pharmacy electronically.  

## 2017-01-22 NOTE — Telephone Encounter (Signed)
Incorrect instructions on refill previously sent in. Signature updated and sent to patient's pharmacy electronically.

## 2017-01-22 NOTE — Telephone Encounter (Signed)
New message      Needs to clarify prescription sent through on the lisinopril (PRINIVIL,ZESTRIL) 20 MG tablet

## 2017-01-24 ENCOUNTER — Ambulatory Visit (INDEPENDENT_AMBULATORY_CARE_PROVIDER_SITE_OTHER): Payer: Medicare Other | Admitting: *Deleted

## 2017-01-24 DIAGNOSIS — I429 Cardiomyopathy, unspecified: Secondary | ICD-10-CM

## 2017-01-24 NOTE — Progress Notes (Signed)
Remote ICD transmission.   

## 2017-01-25 ENCOUNTER — Encounter: Payer: Self-pay | Admitting: Cardiology

## 2017-01-26 LAB — CUP PACEART REMOTE DEVICE CHECK
Battery Remaining Percentage: 100 %
Brady Statistic RV Percent Paced: 1 %
Date Time Interrogation Session: 20190109054200
HighPow Impedance: 45 Ohm
Implantable Lead Model: 157
Implantable Pulse Generator Implant Date: 20160126
Lead Channel Impedance Value: 778 Ohm
Lead Channel Pacing Threshold Amplitude: 0.9 V
Lead Channel Pacing Threshold Pulse Width: 0.5 ms
Lead Channel Setting Pacing Pulse Width: 0.5 ms
Lead Channel Setting Sensing Sensitivity: 0.6 mV
MDC IDC LEAD IMPLANT DT: 20090120
MDC IDC LEAD LOCATION: 753860
MDC IDC LEAD SERIAL: 138237
MDC IDC MSMT BATTERY REMAINING LONGEVITY: 144 mo
MDC IDC SET LEADCHNL RV PACING AMPLITUDE: 2.2 V
Pulse Gen Serial Number: 191956

## 2017-02-01 ENCOUNTER — Other Ambulatory Visit: Payer: Self-pay | Admitting: *Deleted

## 2017-02-09 ENCOUNTER — Ambulatory Visit (INDEPENDENT_AMBULATORY_CARE_PROVIDER_SITE_OTHER): Payer: Medicare Other | Admitting: Urology

## 2017-02-09 ENCOUNTER — Other Ambulatory Visit (HOSPITAL_COMMUNITY)
Admission: RE | Admit: 2017-02-09 | Discharge: 2017-02-09 | Disposition: A | Discharge status: Home / Self Care | Payer: Medicare Other | Source: Other Acute Inpatient Hospital | Attending: Family Medicine | Admitting: Family Medicine

## 2017-02-09 DIAGNOSIS — N3020 Other chronic cystitis without hematuria: Secondary | ICD-10-CM | POA: Insufficient documentation

## 2017-02-09 DIAGNOSIS — M545 Low back pain: Secondary | ICD-10-CM | POA: Diagnosis not present

## 2017-02-09 LAB — URINALYSIS, COMPLETE (UACMP) WITH MICROSCOPIC
Bilirubin Urine: NEGATIVE
Glucose, UA: NEGATIVE mg/dL
Ketones, ur: NEGATIVE mg/dL
NITRITE: NEGATIVE
PROTEIN: 30 mg/dL — AB
SPECIFIC GRAVITY, URINE: 1.009 (ref 1.005–1.030)
pH: 5 (ref 5.0–8.0)

## 2017-02-11 LAB — URINE CULTURE: Culture: NO GROWTH

## 2017-02-15 DIAGNOSIS — M13861 Other specified arthritis, right knee: Secondary | ICD-10-CM | POA: Diagnosis not present

## 2017-02-15 DIAGNOSIS — H353113 Nonexudative age-related macular degeneration, right eye, advanced atrophic without subfoveal involvement: Secondary | ICD-10-CM | POA: Diagnosis not present

## 2017-02-15 DIAGNOSIS — M25561 Pain in right knee: Secondary | ICD-10-CM | POA: Diagnosis not present

## 2017-02-15 DIAGNOSIS — H3561 Retinal hemorrhage, right eye: Secondary | ICD-10-CM | POA: Diagnosis not present

## 2017-02-15 DIAGNOSIS — H353222 Exudative age-related macular degeneration, left eye, with inactive choroidal neovascularization: Secondary | ICD-10-CM | POA: Diagnosis not present

## 2017-02-15 DIAGNOSIS — H353212 Exudative age-related macular degeneration, right eye, with inactive choroidal neovascularization: Secondary | ICD-10-CM | POA: Diagnosis not present

## 2017-02-19 ENCOUNTER — Encounter: Payer: Self-pay | Admitting: *Deleted

## 2017-02-19 DIAGNOSIS — M4856XD Collapsed vertebra, not elsewhere classified, lumbar region, subsequent encounter for fracture with routine healing: Secondary | ICD-10-CM | POA: Diagnosis not present

## 2017-02-19 DIAGNOSIS — M4150 Other secondary scoliosis, site unspecified: Secondary | ICD-10-CM | POA: Insufficient documentation

## 2017-02-19 DIAGNOSIS — M25561 Pain in right knee: Secondary | ICD-10-CM | POA: Diagnosis not present

## 2017-02-19 DIAGNOSIS — M13861 Other specified arthritis, right knee: Secondary | ICD-10-CM | POA: Diagnosis not present

## 2017-02-19 DIAGNOSIS — S32001A Stable burst fracture of unspecified lumbar vertebra, initial encounter for closed fracture: Secondary | ICD-10-CM | POA: Insufficient documentation

## 2017-02-19 DIAGNOSIS — M4180 Other forms of scoliosis, site unspecified: Secondary | ICD-10-CM | POA: Diagnosis not present

## 2017-02-19 NOTE — Telephone Encounter (Deleted)
   [facility] Pre-operative Risk Assessment    Request for surgical clearance:  1. What type of surgery is being performed?    2. When is this surgery scheduled? ***   3. What type of clearance is required (medical clearance vs. Pharmacy clearance to hold med vs. Both)? ***  4. Are there any medications that need to be held prior to surgery and how long?***   5. Practice name and name of physician performing surgery? ***   6. What is your office phone and fax number? ***   7. Anesthesia type (None, local, MAC, general) ? ***   Alvina Filbert 02/19/2017, 4:30 PM  _________________________________________________________________   (provider comments below)   This encounter was created in error - please disregard.

## 2017-02-19 NOTE — Telephone Encounter (Signed)
This encounter was created in error - please disregard.

## 2017-02-20 ENCOUNTER — Telehealth: Payer: Self-pay | Admitting: Cardiovascular Disease

## 2017-02-20 NOTE — Telephone Encounter (Signed)
   [facility] Pre-operative Risk Assessment    Request for surgical clearance:  1. What type of surgery is being performed? Lumbar epidural steroid injection    2. When is this surgery scheduled? tentative for Mar 01, 2017   3. What type of clearance is required (medical clearance vs. Pharmacy clearance to hold med vs. Both)? pharmacy  4. Are there any medications that need to be held prior to surgery and how long? Eliquis - 3 days prior   5. Practice name and name of physician performing surgery? Dr. Nelva Bush @ [facility]   6. What is your office phone and fax number? (p) [phone] ext: [phone]  (f) [phone]   7. Anesthesia type (None, local, MAC, general) ? Not specified    Fidel Levy 02/20/2017, 9:28 AM  _________________________________________________________________   (provider comments below)

## 2017-02-21 NOTE — Telephone Encounter (Signed)
Patient with diagnosis of Afib with remote history of DVT on Eliquis for anticoagulation.    Procedure: Epidural injection  Date of procedure: 03/01/17  CHADS2-VASc score of  6 (CHF, HTN, AGE, DM2, stroke/tia x 2, CAD, AGE, female)  CrCl 44ml/min  Per office protocol, patient can hold Eliquis for 3 days prior to procedure. Resume as soon as safe after procedure.

## 2017-02-22 NOTE — Telephone Encounter (Signed)
Routed to requesting provider.  Preop call back pool, please call office and make sure they received this documentation.

## 2017-02-22 NOTE — Telephone Encounter (Signed)
Have attempted to call [facility] @ number listed and option for provider line - unable to get thru to office.

## 2017-02-23 NOTE — Telephone Encounter (Signed)
Another clearance request for same procedure (different date) was faxed and sent to triage. New date of procedure March 20, 2017. New clearance to be sent to [phone]. This clearance note has been faxed via epic to this number.

## 2017-02-26 LAB — CUP PACEART INCLINIC DEVICE CHECK
Date Time Interrogation Session: 20190211170907
Implantable Lead Model: 157
Implantable Lead Serial Number: 138237
MDC IDC LEAD IMPLANT DT: 20090120
MDC IDC LEAD LOCATION: 753860
MDC IDC PG IMPLANT DT: 20160126
MDC IDC PG SERIAL: 191956

## 2017-03-06 LAB — CUP PACEART INCLINIC DEVICE CHECK
Implantable Lead Implant Date: 20090120
Implantable Lead Location: 753860
MDC IDC LEAD SERIAL: 138237
MDC IDC PG IMPLANT DT: 20160126
MDC IDC SESS DTM: 20190219151615
Pulse Gen Serial Number: 191956

## 2017-03-14 ENCOUNTER — Encounter: Payer: Self-pay | Admitting: Cardiovascular Disease

## 2017-03-14 ENCOUNTER — Ambulatory Visit (INDEPENDENT_AMBULATORY_CARE_PROVIDER_SITE_OTHER): Payer: Medicare Other | Admitting: Cardiovascular Disease

## 2017-03-14 VITALS — BP 120/72 | HR 60 | Ht 63.0 in | Wt 162.0 lb

## 2017-03-14 DIAGNOSIS — I480 Paroxysmal atrial fibrillation: Secondary | ICD-10-CM | POA: Diagnosis not present

## 2017-03-14 DIAGNOSIS — D509 Iron deficiency anemia, unspecified: Secondary | ICD-10-CM | POA: Diagnosis not present

## 2017-03-14 DIAGNOSIS — E7800 Pure hypercholesterolemia, unspecified: Secondary | ICD-10-CM

## 2017-03-14 DIAGNOSIS — I472 Ventricular tachycardia, unspecified: Secondary | ICD-10-CM

## 2017-03-14 DIAGNOSIS — Z95810 Presence of automatic (implantable) cardiac defibrillator: Secondary | ICD-10-CM

## 2017-03-14 DIAGNOSIS — N183 Chronic kidney disease, stage 3 unspecified: Secondary | ICD-10-CM

## 2017-03-14 DIAGNOSIS — Z7901 Long term (current) use of anticoagulants: Secondary | ICD-10-CM | POA: Diagnosis not present

## 2017-03-14 DIAGNOSIS — I951 Orthostatic hypotension: Secondary | ICD-10-CM

## 2017-03-14 DIAGNOSIS — I2510 Atherosclerotic heart disease of native coronary artery without angina pectoris: Secondary | ICD-10-CM

## 2017-03-14 DIAGNOSIS — E119 Type 2 diabetes mellitus without complications: Secondary | ICD-10-CM | POA: Diagnosis not present

## 2017-03-14 MED ORDER — AMLODIPINE BESYLATE 2.5 MG PO TABS: 2.5000 mg | ORAL_TABLET | Freq: Every day | ORAL | 3 refills | Status: DC

## 2017-03-14 NOTE — Progress Notes (Signed)
. Patient ID: [name], female   DOB: 06/13/1931, 84 y.o.   MRN: [id number]    Cardiology Office Note    Date:  03/15/2017   ID:  [name], [name] 12/21/1931, MRN [id number]  PCP:  Chipper Herb, MD  Cardiologist:   Sanda Klein, MD   No chief complaint on file.   History of Present Illness:  [name] is a 84 y.o. 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  Her episodes of weakness continue to occur intermittently but are not particularly severe.  They last for a few days and she just has to take it easy and rest a lot.  She's only had a couple of these spells in the last 2 months: one of these episodes began last Friday and is still going on today although she does not feel particularly bad.  The during atrial fibrillation today, which supports the suspicion that her episodes of weakness are related to the atrial arrhythmia.  Rate control is good with a resting heart rate varying from 60-90.  She does not appear to be acute distress.  She never initiated the amiodarone.  She was too worried about the side effects.  She still has episodes of orthostatic hypotension but has learned to change positions slowly and gradually.  She has not had falls or injury.  She denies any bleeding events.  She has not had full-blown syncope.  She denies angina at rest or with activity.  She does not have edema, orthopnea or PND.  Her biggest complaint today is back pain.  She  is seeing Dr. Rolena Infante at [place] and is contemplating undergoing some type of injection with Dr. Nelva Bush.  She asks about temporary interruption of anticoagulants.  Her anemia resolved with iron supplementation, despite continuing the anticoagulant.  Shee was on treatment with disopyramide for years for her history of catecholaminergic polymorphic VT, but this medication had to be gradually discontinued due to side effects, especially orthostatic hypotension and dry mouth.  In the past she developed torsades de pointes with sotalol and diarrhea with quinidine.  She has a Paramedic. Her generator is still essentially at beginning of life since implantation in 2016 as a generator changeout.  She had a remote download performed on January 11.  There is only 1% ventricular pacing and there are no high ventricular rates.  The device does not show any episodes of ventricular tachycardia arrhythmia.  As before there is a small population of faster ventricular beats on the extreme right of her heart rate histogram, around 90-100 bpm, probably represents episodes of atrial fibrillation.   Past Medical History:  Diagnosis Date  . AICD (automatic cardioverter/defibrillator) present   . Allergy    SESONAL  . Anxiety   . ARTHRITIS   . Arthritis   . Atrial fibrillation ([place])   . CAD (coronary artery disease)    a. history of cardiac arrest 1993;  b. s/p LAD/LCX stenting in 2003;  c. 07/2011 Cath: LM nl, LAD patent stent, D1 80ost, LCX patent stent, RCA min  irregs;  d. 04/2012 MV: EF 66%, no ishcemia;  e. 12/2013 Echo: Ef 55%, no rwma, Gr 1 DD, triv AI/MR, mildly dil LA;  f. 12/2013 Lexi MV: intermediate risk - apical ischemia and inf/infsept fixed defect, ? artifactual.  . CAD in native artery 12/31/2013   Previous stents to LAD and Ramus patent on cath today 12/31/13 also There is severe disease in the ostial first diagonal which is unchanged from most recent  cardiac catheterization. The right coronary artery could not be engaged selectively but nonselective angiography showed no significant disease in the proximal and midsegment.     . Cataract    DENIES  . Chronic back pain   . Chronic sinus bradycardia   . CKD (chronic kidney disease), stage III ([place])   . Clotting disorder (HCC)    DVT  . Diabetes mellitus without complication ([place])    DENIES  . Diverticulosis of colon (without mention of hemorrhage) 2007   Colonoscopy   . Esophageal stricture    a. 2012 s/p dil.  . Esophagitis, unspecified    a. 2012 EGD  . EXTERNAL HEMORRHOIDS   . GERD (gastroesophageal reflux disease)    omeprazole  . H/O hiatal hernia   . Hiatal hernia    a. 2012 EGD.  Marland Kitchen History of DVT in the past, not on Coumadin now    left  leg  . HYPERCHOLESTEROLEMIA   . Hypertension   . ICD (implantable cardiac defibrillator) in place    a. s/p initial ICD in 1993 in setting of cardiac arrest;  b. 01/2007 gen change: Guidant T135 Vitality DS VR single lead ICD.  . Macular degeneration    gets injecton in eye every 5 weeks- last injection - 05/03/2013   . Myocardial infarction ([place]) 1993  . Nephrolithiasis, just saw Dr [name]   . Orthostatic hypotension   . Peripheral vascular disease ([place])    ???  . Unspecified gastritis and gastroduodenitis without mention of hemorrhage    a. 2003 EGD->not noted on 2012 EGD.    Past Surgical History:  Procedure Laterality Date  . BREAST BIOPSY Left 11/2013  . CARDIAC CATHETERIZATION    . CARDIAC CATHETERIZATION  01/01/15   patent stents -there is severe disease in ostial 1st diag but without change.   Marland Kitchen CARDIAC DEFIBRILLATOR PLACEMENT  02/05/2007   Guidant  . CARDIOVASCULAR STRESS TEST  12/30/2013   abnormal  . COLONOSCOPY    . coronary stents     . CYSTOSCOPY WITH URETEROSCOPY Right 05/20/2013   Procedure: CYSTOSCOPY, RIGHT URETEROSCOPY STONE EXTRACTION, Insertion of right DOUBLE J STENT ;  Surgeon: Irine Seal, MD;   Location: [facility];  Service: Urology;  Laterality: Right;  . CYSTOSCOPY WITH URETEROSCOPY AND STENT PLACEMENT N/A 06/03/2013   Procedure: SECOND LOOK CYSTOSCOPY WITH URETEROSCOPY  HOLMIUM LASER LITHO AND STONE EXTRACTION Sammie Bench ;  Surgeon: Malka So, MD;  Location: [facility];  Service: Urology;  Laterality: N/A;  . GIVENS CAPSULE STUDY N/A 10/30/2016   Procedure: GIVENS CAPSULE STUDY;  Surgeon: Irene Shipper, MD;  Location: [facility];  Service: Endoscopy;  Laterality: N/A;  NEEDS TO BE ADMITTED FOR OBSERVATION PT. HAS DEFIB  . HEMORROIDECTOMY  80's  . HOLMIUM LASER APPLICATION Right 08/18/5051   Procedure: HOLMIUM LASER APPLICATION;  Surgeon: Irine Seal, MD;  Location: [facility];  Service: Urology;  Laterality: Right;  . HYSTEROSCOPY W/D&C  09/02/2010   Procedure: DILATATION AND CURETTAGE (D&C) /HYSTEROSCOPY;  Surgeon: Margarette Asal;  Location:  [facility];  Service: Gynecology;  Laterality: N/A;  Dilation and Curettage with Hysteroscopy and Polypectomy  . IMPLANTABLE CARDIOVERTER DEFIBRILLATOR (ICD) GENERATOR CHANGE N/A 02/10/2014   Procedure: ICD GENERATOR CHANGE;  Surgeon: Sanda Klein, MD;  Location: [facility];  Service: Cardiovascular;  Laterality: N/A;  . LEFT HEART CATHETERIZATION WITH CORONARY ANGIOGRAM N/A 07/28/2011   Procedure: LEFT HEART CATHETERIZATION WITH CORONARY ANGIOGRAM;  Surgeon: Lorretta Harp, MD;  Location: [facility];  Service: Cardiovascular;  Laterality: N/A;  . LEFT HEART CATHETERIZATION WITH CORONARY ANGIOGRAM N/A 12/31/2013   Procedure: LEFT HEART CATHETERIZATION WITH CORONARY ANGIOGRAM;  Surgeon: Wellington Hampshire, MD;  Location: [facility];  Service: Cardiovascular;  Laterality: N/A;    Current Medications: Outpatient Medications Prior to Visit  Medication Sig Dispense Refill  . Calcium Citrate-Vitamin D (CITRACAL + D PO) Take 1 tablet by mouth daily.    . cefdinir (OMNICEF) 300 MG capsule Take 1 capsule (300 mg total) 2 (two) times daily by mouth. 1 po BID 20  capsule 0  . Cholecalciferol (VITAMIN D) 1000 UNITS capsule Take 2,000 Units by mouth daily.     Marland Kitchen ELIQUIS 5 MG TABS tablet Take 1 tablet (5 mg total) by mouth 2 (two) times daily. 180 tablet 1  . ferrous sulfate 325 (65 FE) MG tablet Take 1 tablet (325 mg total) by mouth 2 (two) times daily with a meal. 180 tablet 3  . gabapentin (NEURONTIN) 100 MG capsule TAKE 1 CAPSULE AT BEDTIME 90 capsule 0  . lisinopril (PRINIVIL,ZESTRIL) 20 MG tablet Take 1 tablet (20 mg total) by mouth 2 (two) times daily. 180 tablet 3  . methocarbamol (ROBAXIN) 500 MG tablet Take 1 tablet (500 mg total) by mouth 2 (two) times daily. 20 tablet 0  . metoprolol tartrate (LOPRESSOR) 50 MG tablet Take 1 tablet (50 mg total) by mouth 2 (two) times daily. 180 tablet 3  . Multiple Vitamins-Minerals (CENTRUM SILVER ULTRA WOMENS PO) Take 1 tablet by mouth daily.    . nitroGLYCERIN (NITROSTAT) 0.4 MG SL tablet Place 1 tablet (0.4 mg total) under the tongue every 5 (five) minutes as needed for chest pain. 25 tablet 4  . pantoprazole (PROTONIX) 40 MG tablet Take 1 tablet (40 mg total) by mouth 2 (two) times daily. 180 tablet 3  . PARoxetine (PAXIL) 20 MG tablet TAKE (1) TABLET DAILY IN THE MORNING. 90 tablet 0  . rosuvastatin (CRESTOR) 40 MG tablet Take 20 mg by mouth daily.    Marland Kitchen torsemide (DEMADEX) 20 MG tablet Take 20 mg by mouth daily as needed (edema).    . triamcinolone (KENALOG) 0.025 % ointment Apply 1 application topically 2 (two) times daily. On lower back rash 80 g 0  . amLODipine (NORVASC) 5 MG tablet Take 1 tablet by mouth daily.    . rosuvastatin (CRESTOR) 20 MG tablet Take 1 tablet (20 mg total) by mouth daily. 90 tablet 3  . amiodarone (PACERONE) 200 MG tablet Take 1 tablet (200 mg total) by mouth 2 (two) times daily. (Patient not taking: Reported on 03/14/2017) 60 tablet 6  . rosuvastatin (CRESTOR) 40 MG tablet TAKE 1 TABLET DAILY (Patient taking differently: TAKE 0.5 TABLET DAILY) 90 tablet 0   No  facility-administered medications prior to visit.      Allergies:   Sotalol; Bactrim [sulfamethoxazole-trimethoprim]; Actos [pioglitazone]; Adhesive [tape]; Contrast media [iodinated diagnostic agents]; Latex; and Lipitor [atorvastatin]   Social History   Socioeconomic History  . Marital status: Widowed    Spouse name:  None  . Number of children: None  . Years of education: None  . Highest education level: None  Social Needs  . Financial resource strain: None  . Food insecurity - worry: None  . Food insecurity - inability: None  . Transportation needs - medical: None  . Transportation needs - non-medical: None  Occupational History  . None  Tobacco Use  . Smoking status: Never Smoker  . Smokeless tobacco: Never Used  Substance and Sexual Activity  . Alcohol use: No  . Drug use: No  . Sexual activity: No    Birth control/protection: Post-menopausal  Other Topics Concern  . None  Social History Narrative  . None     Family History:  The patient's family history includes Asthma in her brother; Atrial fibrillation in her sister; Cancer in her daughter; Colon polyps in her daughter; Diabetes in her daughter; Heart attack in her brother and father; Heart disease in her brother; Hyperlipidemia in her sister; Macular degeneration in her sister; Osteoporosis in her sister; Other in her father and mother; Stroke in her mother, sister, and sister; Uterine cancer in her sister.   ROS:   Please see the history of present illness.    ROS All other systems reviewed and are negative.   PHYSICAL EXAM:   VS:  BP 120/72   Pulse 60   Ht 5\' 3"  (1.6 m)   Wt 162 lb (73.5 kg)   BMI 28.70 kg/m      General: Alert, oriented x3, no distress, appears comfortable Head: no evidence of trauma, PERRL, EOMI, no exophtalmos or lid lag, no myxedema, no xanthelasma; normal ears, nose and oropharynx Neck: normal jugular venous pulsations and no hepatojugular reflux; brisk carotid pulses without delay  and no carotid bruits Chest: clear to auscultation, no signs of consolidation by percussion or palpation, normal fremitus, symmetrical and full respiratory excursions.  Healthy left subclavian defibrillator site Cardiovascular: normal position and quality of the apical impulse, irregular rhythm, normal first and paradoxically split second heart sounds, no murmurs, rubs or gallops Abdomen: no tenderness or distention, no masses by palpation, no abnormal pulsatility or arterial bruits, normal bowel sounds, no hepatosplenomegaly Extremities: no clubbing, cyanosis or edema; 2+ radial, ulnar and brachial pulses bilaterally; 2+ right femoral, posterior tibial and dorsalis pedis pulses; 2+ left femoral, posterior tibial and dorsalis pedis pulses; no subclavian or femoral bruits Neurological: grossly nonfocal Psych: Normal mood and affect  Wt Readings from Last 3 Encounters:  03/14/17 162 lb (73.5 kg)  12/18/16 168 lb 9.6 oz (76.5 kg)  12/14/16 166 lb (75.3 kg)    Studies/Labs Reviewed:   EKG:  EKG is ordered today.  Atrial fibrillation, left axis deviation, left bundle branch block with QRS 126 ms, QTC 470  Recent Labs: 08/30/2016: Magnesium 2.2 11/17/2016: ALT 10; TSH 2.560 12/13/2016: BNP 100.4; BUN 17; Creatinine, Ser 1.21; Hemoglobin 13.1; Platelets 275; Potassium 4.1; Sodium 140   Lipid Panel    Component Value Date/Time   CHOL 151 11/17/2016 1142   CHOL 140 07/31/2012 1019   TRIG 113 11/17/2016 1142   TRIG 115 06/07/2015 1111   TRIG 161 (H) 07/31/2012 1019   HDL 61 11/17/2016 1142   HDL 68 06/07/2015 1111   HDL 43 07/31/2012 1019   CHOLHDL 2.5 11/17/2016 1142   CHOLHDL 2.8 09/02/2010 0629   VLDL 34 09/02/2010 0629   LDLCALC 67 11/17/2016 1142   LDLCALC 65 09/09/2013 0841   LDLCALC 65 07/31/2012 1019     ASSESSMENT:  1. Paroxysmal atrial fibrillation, chads2 Vasc2 score of 5, on eliquis   2. Long term current use of anticoagulant   3. Iron deficiency anemia, unspecified  iron deficiency anemia type   4. VT (ventricular tachycardia) ([place])   5. CAD in native artery   6. Orthostatic hypotension   7. Diabetes mellitus type 2 in nonobese (HCC)   8. CKD (chronic kidney disease) stage 3, GFR 30-59 ml/min (HCC)   9. ICD (implantable cardioverter-defibrillator) in place   10. Hypercholesterolemia      PLAN:  In order of problems listed above:  1. AFib: We have confirmation today that she has episodes of atrial fibrillation with controlled ventricular rate, that are likely responsible for her episodes of reduced energy.  Although troublesome her symptoms are not particularly severe.  Her ICD shows that she has not had unacceptable episodes of rapid ventricular response.  She is appropriately anticoagulated, CHADSVasc 4 (age 35, gender, DM).  She does not want to take amiodarone due to the potential side effects and due to her history of polymorphic VT and CAD (and side effects with other antiarrhythmics in the past) there is really no other good antiarrhythmic choice.  Will continue strategy of rate control only. 2. Eliquis: No history of TIA or stroke or other systemic embolism. Microcytic anemia resolved simply with iron supplementation without interruption of the anticoagulant.  I think it is a low risk proposition for her to temporarily interrupt the anticoagulant for back injections. 3. Iron deficiency anemia: Resolved.  4. VT: She has not had any defibrillator therapy or any recurrence of VT since her defibrillator implantation in 1993, despite the fact that we stopped disopyramide therapy a couple of years ago 5. CAD: Asymptomatic.  She does not have angina pectoris and has not required revascularization since 2003.  Her last assessment was a cardiac catheterization 2015 that did not show any significant coronary stenoses. 6. HTN/Orthostatic hypotension: Even though we have stopped disopyramide she continues to have some orthostatic hypotension likely related to her  numerous antihypertensive medications and the diuretic. We will decrease her amlodipine to 2.5 mg daily.   7. DM: Controlled with diet 8. CKD: Most recent labs show baseline GFR around 40 and creatinine around 1.2.  9. ICD: Normal device function. Continue remote downloads every 3 months and at least yearly office visits. 10. HLP:  on statin. LDL< 70.  Medication Adjustments/Labs and Tests Ordered: Current medicines are reviewed at length with the patient today.  Concerns regarding medicines are outlined above.  Medication changes, Labs and Tests ordered today are listed in the Patient Instructions below. Patient Instructions  Dr Sallyanne Kuster has recommended making the following medication changes: 1. DECREASE Amlodipine to 2.5 mg daily   Remote monitoring is used to monitor your Pacemaker of ICD from home. This monitoring reduces the number of office visits required to check your device to one time per year. It allows [place] to keep an eye on the functioning of your device to ensure it is working properly. You are scheduled for a device check from home on Wednesday, April 10th, 2019. You may send your transmission at any time that day. If you have a wireless device, the transmission will be sent automatically. After your physician reviews your transmission, you will receive a postcard with your next transmission date.  Dr Sallyanne Kuster recommends that you schedule a follow-up appointment in 6 months with an ICD check. You will receive a reminder letter in the mail two months in advance. If  you don't receive a letter, please call our office to schedule the follow-up appointment.  If you need a refill on your cardiac medications before your next appointment, please call your pharmacy.    Signed, Sanda Klein, MD  03/15/2017 5:07 PM    [facility] [address], [place], [place]  [address] Phone: [phone]; Fax: [phone]

## 2017-03-14 NOTE — Patient Instructions (Addendum)
Dr Sallyanne Kuster has recommended making the following medication changes: 1. DECREASE Amlodipine to 2.5 mg daily   Remote monitoring is used to monitor your Pacemaker of ICD from home. This monitoring reduces the number of office visits required to check your device to one time per year. It allows [place] to keep an eye on the functioning of your device to ensure it is working properly. You are scheduled for a device check from home on Wednesday, April 10th, 2019. You may send your transmission at any time that day. If you have a wireless device, the transmission will be sent automatically. After your physician reviews your transmission, you will receive a postcard with your next transmission date.  Dr Sallyanne Kuster recommends that you schedule a follow-up appointment in 6 months with an ICD check. You will receive a reminder letter in the mail two months in advance. If you don't receive a letter, please call our office to schedule the follow-up appointment.  If you need a refill on your cardiac medications before your next appointment, please call your pharmacy.

## 2017-03-19 ENCOUNTER — Other Ambulatory Visit: Payer: Self-pay | Admitting: Pediatrics

## 2017-03-28 ENCOUNTER — Ambulatory Visit: Payer: Medicare Other | Admitting: Family Medicine

## 2017-03-28 ENCOUNTER — Telehealth: Payer: Self-pay | Admitting: Family Medicine

## 2017-03-28 NOTE — Telephone Encounter (Signed)
Pt called - appt changed 

## 2017-04-05 ENCOUNTER — Encounter: Payer: Self-pay | Admitting: Family Medicine

## 2017-04-05 ENCOUNTER — Ambulatory Visit (INDEPENDENT_AMBULATORY_CARE_PROVIDER_SITE_OTHER): Payer: Medicare Other | Admitting: Family Medicine

## 2017-04-05 VITALS — BP 135/68 | HR 52 | Temp 97.2°F | Ht 63.0 in | Wt 162.0 lb

## 2017-04-05 DIAGNOSIS — I4891 Unspecified atrial fibrillation: Secondary | ICD-10-CM

## 2017-04-05 DIAGNOSIS — E7800 Pure hypercholesterolemia, unspecified: Secondary | ICD-10-CM | POA: Diagnosis not present

## 2017-04-05 DIAGNOSIS — I5032 Chronic diastolic (congestive) heart failure: Secondary | ICD-10-CM | POA: Diagnosis not present

## 2017-04-05 DIAGNOSIS — I10 Essential (primary) hypertension: Secondary | ICD-10-CM | POA: Diagnosis not present

## 2017-04-05 DIAGNOSIS — N183 Chronic kidney disease, stage 3 unspecified: Secondary | ICD-10-CM

## 2017-04-05 DIAGNOSIS — I480 Paroxysmal atrial fibrillation: Secondary | ICD-10-CM

## 2017-04-05 DIAGNOSIS — E119 Type 2 diabetes mellitus without complications: Secondary | ICD-10-CM

## 2017-04-05 DIAGNOSIS — E1140 Type 2 diabetes mellitus with diabetic neuropathy, unspecified: Secondary | ICD-10-CM

## 2017-04-05 DIAGNOSIS — I2510 Atherosclerotic heart disease of native coronary artery without angina pectoris: Secondary | ICD-10-CM

## 2017-04-05 DIAGNOSIS — E559 Vitamin D deficiency, unspecified: Secondary | ICD-10-CM | POA: Diagnosis not present

## 2017-04-05 LAB — BAYER DCA HB A1C WAIVED: HB A1C: 7.5 % — AB (ref <7.0)

## 2017-04-05 NOTE — Progress Notes (Signed)
Subjective:    Patient ID: [name], female    DOB: 1931-01-20, 84 y.o.   MRN: [id number]  HPI Pt here for follow up and management of chronic medical problems which includes diabetes, hypertension and hyperlipidemia. She is taking medication regularly.  This patient is followed regularly by both Dr. Percival Spanish and [place].  Her last visit for cardiology evaluation was in February of this year.  She still has some episodes of weakness.  These may be associated with orthostatic hypotension.  She is also seeing the orthopedic surgeon because of ongoing back pain.  Her pacemaker was last changed in 2016.  She has paroxysmal atrial fibrillation.  She has iron deficiency anemia diabetes mellitus and chronic kidney disease.  Her pacemaker is an implantable cardioverter defibrillator.  Also has had several recurrent urinary tract infections.  The patient is pleasant but somewhat stressed as her sister Mrs. Conley Canal is on hospice and it is not expected to live much longer.  The patient will see Dr. Loletha Grayer again in August.  She is on Eliquis.  She denies any chest pain tightness or pressure.  She still has her weak spells and just tries to sit down and rest and this is usually when her blood pressure drops when she is in atrial fibrillation.  She denies any shortness of breath anymore than usual and no problems with her intestinal tract including nausea vomiting diarrhea or blood in the stool.  Her GU problems are stable.  Her fasting blood sugars at home of been running around 120.    Patient Active Problem List   Diagnosis Date Noted  . History of drug-induced prolonged QT interval with torsade de pointes 11/01/2016  . Iron deficiency anemia 10/30/2016  . Long term current use of anticoagulant 02/17/2016  . Near syncope 05/22/2015  . Diabetes mellitus type 2, diet-controlled ([place]) 09/14/2014  . ICD (implantable cardioverter-defibrillator) battery depletion 01/20/2014  . Abnormal nuclear stress test,  12/30/13 12/31/2013  . CAD in native artery 12/31/2013  . Unstable angina, neg MI, cath stable.maybe GI 12/30/2013  . Paroxysmal atrial fibrillation, chads2 Vasc2 score of 5, on eliquis 10/16/2013  . Catecholaminergic polymorphic ventricular tachycardia ([place]) 10/16/2013  . Ureteral stone with hydronephrosis 05/20/2013  . Metabolic syndrome 20/35/5974  . Hypotension, meds stopped this admission 07/29/2011  . Orthostatic hypotension 07/27/2011  . Chest pain, r/o cardiac, negative MI 07/25/2011  . Chronic sinus bradycardia 07/25/2011  . ICD (implantable cardioverter-defibrillator) in place 07/25/2011  . CKD (chronic kidney disease) stage 3, GFR 30-59 ml/min (HCC) 07/25/2011    Class: Chronic  . History of DVT in the past, on eliquis now 07/25/2011    Class: History of  . Nephrolithiasis, just saw Dr [name] 07/25/2011    Class: History of  . DYSPHAGIA 03/24/2010  . Chronic ischemic heart disease 12/03/2007  . RECTAL BLEEDING 12/03/2007  . Irritable bowel syndrome with diarrhea 12/03/2007  . EPIGASTRIC PAIN 12/03/2007  . Hyperlipidemia 12/02/2007    Class: History of  . EXTERNAL HEMORRHOIDS 12/02/2007    Class: History of  . ESOPHAGITIS 12/02/2007  . GASTRITIS 12/02/2007  . COLITIS 12/02/2007  . DIVERTICULOSIS, COLON 12/02/2007  . ARTHRITIS 12/02/2007    Class: Chronic   Outpatient Encounter Medications as of 04/05/2017  Medication Sig  . amLODipine (NORVASC) 2.5 MG tablet Take 1 tablet (2.5 mg total) by mouth daily.  . Calcium Citrate-Vitamin D (CITRACAL + D PO) Take 1 tablet by mouth daily.  . Cholecalciferol (VITAMIN D) 1000 UNITS capsule  Take 2,000 Units by mouth daily.   Marland Kitchen ELIQUIS 5 MG TABS tablet Take 1 tablet (5 mg total) by mouth 2 (two) times daily.  . ferrous sulfate 325 (65 FE) MG tablet Take 1 tablet (325 mg total) by mouth 2 (two) times daily with a meal.  . gabapentin (NEURONTIN) 100 MG capsule TAKE 1 CAPSULE AT BEDTIME  . lisinopril (PRINIVIL,ZESTRIL) 20 MG  tablet Take 1 tablet (20 mg total) by mouth 2 (two) times daily.  . methocarbamol (ROBAXIN) 500 MG tablet Take 1 tablet (500 mg total) by mouth 2 (two) times daily.  . metoprolol tartrate (LOPRESSOR) 50 MG tablet Take 1 tablet (50 mg total) by mouth 2 (two) times daily.  . Multiple Vitamins-Minerals (CENTRUM SILVER ULTRA WOMENS PO) Take 1 tablet by mouth daily.  . nitroGLYCERIN (NITROSTAT) 0.4 MG SL tablet Place 1 tablet (0.4 mg total) under the tongue every 5 (five) minutes as needed for chest pain.  . pantoprazole (PROTONIX) 40 MG tablet Take 1 tablet (40 mg total) by mouth 2 (two) times daily.  Marland Kitchen PARoxetine (PAXIL) 20 MG tablet TAKE (1) TABLET DAILY IN THE MORNING.  . rosuvastatin (CRESTOR) 40 MG tablet Take 20 mg by mouth daily.  Marland Kitchen torsemide (DEMADEX) 20 MG tablet Take 20 mg by mouth daily as needed (edema).  . triamcinolone (KENALOG) 0.025 % ointment Apply 1 application topically 2 (two) times daily. On lower back rash  . [DISCONTINUED] PARoxetine (PAXIL) 20 MG tablet TAKE (1) TABLET DAILY IN THE MORNING.  . [DISCONTINUED] cefdinir (OMNICEF) 300 MG capsule Take 1 capsule (300 mg total) 2 (two) times daily by mouth. 1 po BID   No facility-administered encounter medications on file as of 04/05/2017.       Review of Systems  Constitutional: Negative.   HENT: Negative.   Eyes: Negative.   Respiratory: Negative.   Cardiovascular: Positive for palpitations (seen cardio this month - no changes were made ).  Gastrointestinal: Negative.   Endocrine: Negative.   Genitourinary: Negative.   Musculoskeletal: Negative.   Skin: Negative.   Allergic/Immunologic: Negative.   Neurological: Negative.   Hematological: Negative.   Psychiatric/Behavioral: Negative.        Objective:   Physical Exam  Constitutional: She is oriented to person, place, and time. She appears well-developed and well-nourished. No distress.  She is pleasant and doing well though somewhat sad about her sister who is  dying.  HENT:  Head: Normocephalic and atraumatic.  Right Ear: External ear normal.  Left Ear: External ear normal.  Nose: Nose normal.  Mouth/Throat: Oropharynx is clear and moist. No oropharyngeal exudate.  Eyes: Pupils are equal, round, and reactive to light. Conjunctivae and EOM are normal. Right eye exhibits no discharge. Left eye exhibits no discharge. No scleral icterus.  Neck: Normal range of motion. Neck supple. No thyromegaly present.  No bruits thyromegaly or anterior cervical adenopathy  Cardiovascular: Normal rate, regular rhythm, normal heart sounds and intact distal pulses.  No murmur heard. The heart is regular today at 60/min  Pulmonary/Chest: Effort normal and breath sounds normal. No respiratory distress. She has no wheezes. She has no rales.  Clear anteriorly and posteriorly  Abdominal: Soft. Bowel sounds are normal. She exhibits no mass. There is tenderness. There is no rebound and no guarding.  Slight left lower quadrant and left upper quadrant tenderness with scar tissue from previous pacemaker being in the left upper quadrant.  No masses bruits or inguinal adenopathy with good inguinal pulses.  Musculoskeletal: Normal range of  motion. She exhibits no edema.  Lymphadenopathy:    She has no cervical adenopathy.  Neurological: She is alert and oriented to person, place, and time. She has normal reflexes. No cranial nerve deficit.  Skin: Skin is warm and dry. No rash noted.  Fungal nail infections lower extremity  Psychiatric: She has a normal mood and affect. Her behavior is normal. Judgment and thought content normal.  Nursing note and vitals reviewed.    BP 135/68 (BP Location: Left Arm)   Pulse (!) 52   Temp (!) 97.2 F (36.2 C) (Oral)   Ht _0  (1.6 m)   Wt 162 lb (73.5 kg)   BMI 28.70 kg/m       Assessment & Plan:  1. Hyperlipidemia -Continue Crestor pending results of lab work along with aggressive therapeutic lifestyle changes - CBC with  Differential/Platelet - Lipid panel  2. Essential hypertension -The blood pressure is good today and she will continue with current treatment - BMP8+EGFR - CBC with Differential/Platelet - Hepatic function panel  3. Vitamin D deficiency -Continue vitamin D replacement pending results of lab work - CBC with Differential/Platelet - VITAMIN D 25 Hydroxy (Vit-D Deficiency, Fractures)  4. Diabetes mellitus type 2, diet-controlled ([place]) -Continue current treatment pending results of lab work - Microalbumin / creatinine urine ratio - CBC with Differential/Platelet - Bayer DCA Hb A1c Waived  5. CKD (chronic kidney disease) stage 3, GFR 30-59 ml/min (HCC) -Continue to avoid all NSAIDs - BMP8+EGFR - CBC with Differential/Platelet  6. Paroxysmal atrial fibrillation, chads2 Vasc2 score of 5, on eliquis -Follow-up with cardiology as planned - BMP8+EGFR - CBC with Differential/Platelet  7. Type 2 diabetes mellitus with diabetic neuropathy, without long-term current use of insulin (HCC) -Check blood sugars regularly and follow diet as closely as possible and follow-up with podiatry for nail trimming and check feet regularly  8. Chronic diastolic heart failure ([place]) -Follow-up with cardiology as planned  9. Atrial fibrillation, unspecified type [facility]) -Follow-up with cardiology as planned and continue with current medicines.   Patient Instructions                       Medicare Annual Wellness Visit  [facility] and the medical providers at [facility] strive to bring you the best medical care.  In doing so we not only want to address your current medical conditions and concerns but also to detect new conditions early and prevent illness, disease and health-related problems.    Medicare offers a yearly Wellness Visit which allows our clinical staff to assess your need for preventative services including immunizations, lifestyle education, counseling to decrease risk  of preventable diseases and screening for fall risk and other medical concerns.    This visit is provided free of charge (no copay) for all Medicare recipients. The clinical pharmacists at [facility] have begun to conduct these Wellness Visits which will also include a thorough review of all your medications.    As you primary medical provider recommend that you make an appointment for your Annual Wellness Visit if you have not done so already this year.  You may set up this appointment before you leave today or you may call back ([phone]) and schedule an appointment.  Please make sure when you call that you mention that you are scheduling your Annual Wellness Visit with the clinical pharmacist so that the appointment may be made for the proper length of time.  Continue current medications. Continue good therapeutic lifestyle changes which include good diet and exercise. Fall precautions discussed with patient. If an FOBT was given today- please return it to our front desk. If you are over 34 years old - you may need Prevnar 79 or the adult Pneumonia vaccine.  **Flu shots are available--- please call and schedule a FLU-CLINIC appointment**  After your visit with [place] today you will receive a survey in the mail or online from [employer] regarding your care with [place]. Please take a moment to fill this out. Your feedback is very important to [place] as you can help [place] better understand your patient needs as well as improve your experience and satisfaction. WE CARE ABOUT YOU!!!   Continue to follow-up with cardiology Continue to follow-up with podiatry for nail trimming Continue to drink plenty of fluids and stay well-hydrated Check blood sugars regularly Always keep a check for bleeding in the urine or the stool.   Arrie Senate MD

## 2017-04-05 NOTE — Patient Instructions (Addendum)
Medicare Annual Wellness Visit  [facility] and the medical providers at [facility] strive to bring you the best medical care.  In doing so we not only want to address your current medical conditions and concerns but also to detect new conditions early and prevent illness, disease and health-related problems.    Medicare offers a yearly Wellness Visit which allows our clinical staff to assess your need for preventative services including immunizations, lifestyle education, counseling to decrease risk of preventable diseases and screening for fall risk and other medical concerns.    This visit is provided free of charge (no copay) for all Medicare recipients. The clinical pharmacists at [facility] have begun to conduct these Wellness Visits which will also include a thorough review of all your medications.    As you primary medical provider recommend that you make an appointment for your Annual Wellness Visit if you have not done so already this year.  You may set up this appointment before you leave today or you may call back ([phone]) and schedule an appointment.  Please make sure when you call that you mention that you are scheduling your Annual Wellness Visit with the clinical pharmacist so that the appointment may be made for the proper length of time.     Continue current medications. Continue good therapeutic lifestyle changes which include good diet and exercise. Fall precautions discussed with patient. If an FOBT was given today- please return it to our front desk. If you are over 22 years old - you may need Prevnar 67 or the adult Pneumonia vaccine.  **Flu shots are available--- please call and schedule a FLU-CLINIC appointment**  After your visit with [place] today you will receive a survey in the mail or online from [employer] regarding your care with [place]. Please take a moment to fill this out. Your feedback is very  important to [place] as you can help [place] better understand your patient needs as well as improve your experience and satisfaction. WE CARE ABOUT YOU!!!   Continue to follow-up with cardiology Continue to follow-up with podiatry for nail trimming Continue to drink plenty of fluids and stay well-hydrated Check blood sugars regularly Always keep a check for bleeding in the urine or the stool.

## 2017-04-06 LAB — CBC WITH DIFFERENTIAL/PLATELET
BASOS: 1 %
Basophils Absolute: 0.1 10*3/uL (ref 0.0–0.2)
EOS (ABSOLUTE): 0.4 10*3/uL (ref 0.0–0.4)
EOS: 6 %
HEMATOCRIT: 39.5 % (ref 34.0–46.6)
Hemoglobin: 13.1 g/dL (ref 11.1–15.9)
Immature Grans (Abs): 0 10*3/uL (ref 0.0–0.1)
Immature Granulocytes: 1 %
LYMPHS ABS: 1.4 10*3/uL (ref 0.7–3.1)
Lymphs: 21 %
MCH: 30.6 pg (ref 26.6–33.0)
MCHC: 33.2 g/dL (ref 31.5–35.7)
MCV: 92 fL (ref 79–97)
MONOS ABS: 0.9 10*3/uL (ref 0.1–0.9)
Monocytes: 13 %
NEUTROS ABS: 3.8 10*3/uL (ref 1.4–7.0)
Neutrophils: 58 %
PLATELETS: 290 10*3/uL (ref 150–379)
RBC: 4.28 x10E6/uL (ref 3.77–5.28)
RDW: 14.7 % (ref 12.3–15.4)
WBC: 6.5 10*3/uL (ref 3.4–10.8)

## 2017-04-06 LAB — BMP8+EGFR
BUN / CREAT RATIO: 15 (ref 12–28)
BUN: 18 mg/dL (ref 8–27)
CO2: 22 mmol/L (ref 20–29)
Calcium: 9.6 mg/dL (ref 8.7–10.3)
Chloride: 104 mmol/L (ref 96–106)
Creatinine, Ser: 1.23 mg/dL — ABNORMAL HIGH (ref 0.57–1.00)
GFR, EST AFRICAN AMERICAN: 47 mL/min/{1.73_m2} — AB (ref >59)
GFR, EST NON AFRICAN AMERICAN: 41 mL/min/{1.73_m2} — AB (ref >59)
Glucose: 151 mg/dL — ABNORMAL HIGH (ref 65–99)
POTASSIUM: 5.1 mmol/L (ref 3.5–5.2)
SODIUM: 140 mmol/L (ref 134–144)

## 2017-04-06 LAB — LIPID PANEL
Chol/HDL Ratio: 2.5 ratio (ref 0.0–4.4)
Cholesterol, Total: 155 mg/dL (ref 100–199)
HDL: 62 mg/dL (ref >39)
LDL Calculated: 70 mg/dL (ref 0–99)
Triglycerides: 117 mg/dL (ref 0–149)
VLDL Cholesterol Cal: 23 mg/dL (ref 5–40)

## 2017-04-06 LAB — HEPATIC FUNCTION PANEL
ALT: 15 IU/L (ref 0–32)
AST: 17 IU/L (ref 0–40)
Albumin: 4 g/dL (ref 3.5–4.7)
Alkaline Phosphatase: 81 IU/L (ref 39–117)
BILIRUBIN TOTAL: 0.6 mg/dL (ref 0.0–1.2)
BILIRUBIN, DIRECT: 0.18 mg/dL (ref 0.00–0.40)
TOTAL PROTEIN: 6.7 g/dL (ref 6.0–8.5)

## 2017-04-06 LAB — VITAMIN D 25 HYDROXY (VIT D DEFICIENCY, FRACTURES): VIT D 25 HYDROXY: 20.4 ng/mL — AB (ref 30.0–100.0)

## 2017-04-14 ENCOUNTER — Other Ambulatory Visit: Payer: Medicare Other

## 2017-04-14 ENCOUNTER — Other Ambulatory Visit: Payer: Self-pay | Admitting: Family Medicine

## 2017-04-14 DIAGNOSIS — E119 Type 2 diabetes mellitus without complications: Secondary | ICD-10-CM | POA: Diagnosis not present

## 2017-04-14 DIAGNOSIS — Z87440 Personal history of urinary (tract) infections: Secondary | ICD-10-CM

## 2017-04-16 ENCOUNTER — Telehealth: Payer: Self-pay | Admitting: Family Medicine

## 2017-04-16 NOTE — Telephone Encounter (Signed)
Pt aware.

## 2017-04-16 NOTE — Progress Notes (Signed)
Pt aware of labs  

## 2017-04-18 LAB — SPECIMEN STATUS REPORT

## 2017-04-18 LAB — MICROALBUMIN / CREATININE URINE RATIO
Creatinine, Urine: 29 mg/dL
MICROALB/CREAT RATIO: 16.2 mg/g{creat} (ref 0.0–30.0)
Microalbumin, Urine: 4.7 ug/mL

## 2017-04-19 ENCOUNTER — Other Ambulatory Visit: Payer: Self-pay | Admitting: Family Medicine

## 2017-04-25 ENCOUNTER — Ambulatory Visit (INDEPENDENT_AMBULATORY_CARE_PROVIDER_SITE_OTHER): Payer: Medicare Other | Admitting: *Deleted

## 2017-04-25 DIAGNOSIS — I472 Ventricular tachycardia, unspecified: Secondary | ICD-10-CM

## 2017-04-25 NOTE — Progress Notes (Signed)
Remote ICD transmission.   

## 2017-04-26 ENCOUNTER — Encounter: Payer: Self-pay | Admitting: Cardiology

## 2017-04-26 DIAGNOSIS — H3561 Retinal hemorrhage, right eye: Secondary | ICD-10-CM | POA: Diagnosis not present

## 2017-04-26 DIAGNOSIS — H353211 Exudative age-related macular degeneration, right eye, with active choroidal neovascularization: Secondary | ICD-10-CM | POA: Diagnosis not present

## 2017-04-26 DIAGNOSIS — H2512 Age-related nuclear cataract, left eye: Secondary | ICD-10-CM | POA: Diagnosis not present

## 2017-04-26 DIAGNOSIS — H353222 Exudative age-related macular degeneration, left eye, with inactive choroidal neovascularization: Secondary | ICD-10-CM | POA: Diagnosis not present

## 2017-05-11 ENCOUNTER — Ambulatory Visit (HOSPITAL_COMMUNITY)
Admission: RE | Admit: 2017-05-11 | Discharge: 2017-05-11 | Disposition: A | Discharge status: Home / Self Care | Payer: Medicare Other | Source: Ambulatory Visit | Attending: Urology | Admitting: Urology

## 2017-05-11 ENCOUNTER — Other Ambulatory Visit: Payer: Self-pay | Admitting: Urology

## 2017-05-11 ENCOUNTER — Ambulatory Visit (INDEPENDENT_AMBULATORY_CARE_PROVIDER_SITE_OTHER): Payer: Medicare Other | Admitting: Urology

## 2017-05-11 ENCOUNTER — Other Ambulatory Visit (HOSPITAL_COMMUNITY)
Admission: AD | Admit: 2017-05-11 | Discharge: 2017-05-11 | Disposition: A | Discharge status: Home / Self Care | Payer: Medicare Other | Source: Other Acute Inpatient Hospital | Attending: Urology | Admitting: Urology

## 2017-05-11 ENCOUNTER — Other Ambulatory Visit (HOSPITAL_COMMUNITY): Admission: AD | Admit: 2017-05-11 | Payer: Medicare Other | Admitting: Urology

## 2017-05-11 DIAGNOSIS — R300 Dysuria: Secondary | ICD-10-CM

## 2017-05-11 DIAGNOSIS — N29 Other disorders of kidney and ureter in diseases classified elsewhere: Secondary | ICD-10-CM | POA: Insufficient documentation

## 2017-05-11 DIAGNOSIS — N261 Atrophy of kidney (terminal): Secondary | ICD-10-CM | POA: Insufficient documentation

## 2017-05-11 DIAGNOSIS — N200 Calculus of kidney: Secondary | ICD-10-CM

## 2017-05-14 LAB — URINE CULTURE: Culture: 60000 — AB

## 2017-05-21 ENCOUNTER — Other Ambulatory Visit: Payer: Self-pay | Admitting: Internal Medicine

## 2017-05-24 LAB — CUP PACEART REMOTE DEVICE CHECK
Battery Remaining Longevity: 144 mo
Battery Remaining Percentage: 100 %
Brady Statistic RV Percent Paced: 1 %
Date Time Interrogation Session: 20190410044100
HighPow Impedance: 45 Ohm
Implantable Lead Implant Date: 20090120
Implantable Lead Location: 753860
Implantable Lead Serial Number: 138237
Implantable Pulse Generator Implant Date: 20160126
Lead Channel Pacing Threshold Pulse Width: 0.5 ms
Lead Channel Setting Pacing Amplitude: 2.2 V
Lead Channel Setting Sensing Sensitivity: 0.6 mV
MDC IDC MSMT LEADCHNL RV IMPEDANCE VALUE: 664 Ohm
MDC IDC MSMT LEADCHNL RV PACING THRESHOLD AMPLITUDE: 0.9 V
MDC IDC PG SERIAL: 191956
MDC IDC SET LEADCHNL RV PACING PULSEWIDTH: 0.5 ms

## 2017-05-30 ENCOUNTER — Ambulatory Visit: Payer: Medicare Other | Admitting: *Deleted

## 2017-06-22 ENCOUNTER — Ambulatory Visit (INDEPENDENT_AMBULATORY_CARE_PROVIDER_SITE_OTHER): Payer: Medicare Other | Admitting: Urology

## 2017-06-22 DIAGNOSIS — R300 Dysuria: Secondary | ICD-10-CM | POA: Diagnosis not present

## 2017-06-22 DIAGNOSIS — N200 Calculus of kidney: Secondary | ICD-10-CM

## 2017-06-25 DIAGNOSIS — H2512 Age-related nuclear cataract, left eye: Secondary | ICD-10-CM | POA: Diagnosis not present

## 2017-06-25 DIAGNOSIS — H353124 Nonexudative age-related macular degeneration, left eye, advanced atrophic with subfoveal involvement: Secondary | ICD-10-CM | POA: Diagnosis not present

## 2017-06-25 DIAGNOSIS — H353222 Exudative age-related macular degeneration, left eye, with inactive choroidal neovascularization: Secondary | ICD-10-CM | POA: Diagnosis not present

## 2017-06-25 DIAGNOSIS — H353212 Exudative age-related macular degeneration, right eye, with inactive choroidal neovascularization: Secondary | ICD-10-CM | POA: Diagnosis not present

## 2017-06-25 DIAGNOSIS — M1711 Unilateral primary osteoarthritis, right knee: Secondary | ICD-10-CM | POA: Diagnosis not present

## 2017-06-25 DIAGNOSIS — M25561 Pain in right knee: Secondary | ICD-10-CM | POA: Diagnosis not present

## 2017-06-25 DIAGNOSIS — H3561 Retinal hemorrhage, right eye: Secondary | ICD-10-CM | POA: Diagnosis not present

## 2017-07-17 ENCOUNTER — Other Ambulatory Visit: Payer: Self-pay | Admitting: Family Medicine

## 2017-07-17 ENCOUNTER — Other Ambulatory Visit: Payer: Self-pay | Admitting: Cardiovascular Disease

## 2017-07-17 ENCOUNTER — Telehealth: Payer: Self-pay | Admitting: Cardiovascular Disease

## 2017-07-17 NOTE — Telephone Encounter (Signed)
New Message:    He wants to know if pt is taking Metoprolol and or Lisinopril?

## 2017-07-17 NOTE — Telephone Encounter (Signed)
Spoke to pharmacist. Reviewing chart  No mention of stopping either medications both lisinopril and metoprolol. Medication were started in 01/2017. RN also okayed refilled for iron supplement per Dr C. Last refill.

## 2017-07-25 ENCOUNTER — Ambulatory Visit (INDEPENDENT_AMBULATORY_CARE_PROVIDER_SITE_OTHER): Payer: Medicare Other | Admitting: *Deleted

## 2017-07-25 DIAGNOSIS — I472 Ventricular tachycardia, unspecified: Secondary | ICD-10-CM

## 2017-07-25 DIAGNOSIS — I480 Paroxysmal atrial fibrillation: Secondary | ICD-10-CM

## 2017-07-25 NOTE — Progress Notes (Signed)
Remote ICD transmission.   

## 2017-08-03 ENCOUNTER — Ambulatory Visit: Payer: Medicare Other | Admitting: Urology

## 2017-08-15 ENCOUNTER — Telehealth: Payer: Self-pay | Admitting: Cardiovascular Disease

## 2017-08-15 ENCOUNTER — Encounter: Payer: Self-pay | Admitting: Family Medicine

## 2017-08-15 ENCOUNTER — Ambulatory Visit (INDEPENDENT_AMBULATORY_CARE_PROVIDER_SITE_OTHER): Payer: Medicare Other | Admitting: Family Medicine

## 2017-08-15 VITALS — BP 91/65 | HR 79 | Temp 96.6°F | Ht 63.0 in | Wt 156.0 lb

## 2017-08-15 DIAGNOSIS — I10 Essential (primary) hypertension: Secondary | ICD-10-CM

## 2017-08-15 DIAGNOSIS — I480 Paroxysmal atrial fibrillation: Secondary | ICD-10-CM

## 2017-08-15 DIAGNOSIS — K529 Noninfective gastroenteritis and colitis, unspecified: Secondary | ICD-10-CM | POA: Diagnosis not present

## 2017-08-15 DIAGNOSIS — R531 Weakness: Secondary | ICD-10-CM | POA: Diagnosis not present

## 2017-08-15 DIAGNOSIS — E7800 Pure hypercholesterolemia, unspecified: Secondary | ICD-10-CM

## 2017-08-15 DIAGNOSIS — N183 Chronic kidney disease, stage 3 unspecified: Secondary | ICD-10-CM

## 2017-08-15 DIAGNOSIS — R002 Palpitations: Secondary | ICD-10-CM

## 2017-08-15 DIAGNOSIS — A084 Viral intestinal infection, unspecified: Secondary | ICD-10-CM | POA: Diagnosis not present

## 2017-08-15 DIAGNOSIS — E0843 Diabetes mellitus due to underlying condition with diabetic autonomic (poly)neuropathy: Secondary | ICD-10-CM

## 2017-08-15 DIAGNOSIS — E559 Vitamin D deficiency, unspecified: Secondary | ICD-10-CM | POA: Diagnosis not present

## 2017-08-15 DIAGNOSIS — E1140 Type 2 diabetes mellitus with diabetic neuropathy, unspecified: Secondary | ICD-10-CM

## 2017-08-15 DIAGNOSIS — I2510 Atherosclerotic heart disease of native coronary artery without angina pectoris: Secondary | ICD-10-CM | POA: Diagnosis not present

## 2017-08-15 LAB — BAYER DCA HB A1C WAIVED: HB A1C (BAYER DCA - WAIVED): 7.2 % — ABNORMAL HIGH (ref <7.0)

## 2017-08-15 NOTE — Telephone Encounter (Signed)
No message needed °

## 2017-08-15 NOTE — Progress Notes (Signed)
Subjective:    Patient ID: [name], female    DOB: 09-15-1931, 84 y.o.   MRN: [id number]  HPI  Pt here for follow up and management of chronic medical problems which includes hypertension and diabetes. She is taking medication regularly.  The patient is feeling bad in general.  She normally has some loose stools and dizziness and has palpitations.  She sees the cardiologist every 6 months.  More recently she has had some vomiting and nausea and additional weakness and sometimes the dizziness seems to be worse as if she is going to pass out.  The patient has a history of chronic diarrhea with may be 5 stools daily but the past couple days it has been worse as many as 8 stools daily.  She also vomited this morning.  She continues to have her palpitations.  The patient denies any chest pain.  She denies any worsening shortness of breath other than the generalized weakness that she has had for the past few days.  She does complain of the dizziness that seems to be worse and the palpitations which seem to be worse.  She has not had any blood in the stool or black tarry bowel movements.  She says she is passing her water without problems but does have some suprapubic tenderness.    Patient Active Problem List   Diagnosis Date Noted  . History of drug-induced prolonged QT interval with torsade de pointes 11/01/2016  . Iron deficiency anemia 10/30/2016  . Long term current use of anticoagulant 02/17/2016  . Near syncope 05/22/2015  . Diabetes mellitus type 2, diet-controlled ([place]) 09/14/2014  . ICD (implantable cardioverter-defibrillator) battery depletion 01/20/2014  . Abnormal nuclear stress test, 12/30/13 12/31/2013  . CAD in native artery 12/31/2013  . Unstable angina, neg MI, cath stable.maybe GI 12/30/2013  . Paroxysmal atrial fibrillation, chads2 Vasc2 score of 5, on eliquis 10/16/2013  . Catecholaminergic polymorphic ventricular tachycardia ([place]) 10/16/2013  . Ureteral stone with  hydronephrosis 05/20/2013  . Metabolic syndrome 28/31/5176  . Hypotension, meds stopped this admission 07/29/2011  . Orthostatic hypotension 07/27/2011  . Chest pain, r/o cardiac, negative MI 07/25/2011  . Chronic sinus bradycardia 07/25/2011  . ICD (implantable cardioverter-defibrillator) in place 07/25/2011  . CKD (chronic kidney disease) stage 3, GFR 30-59 ml/min (HCC) 07/25/2011    Class: Chronic  . History of DVT in the past, on eliquis now 07/25/2011    Class: History of  . Nephrolithiasis, just saw Dr [name] 07/25/2011    Class: History of  . DYSPHAGIA 03/24/2010  . Chronic ischemic heart disease 12/03/2007  . RECTAL BLEEDING 12/03/2007  . Irritable bowel syndrome with diarrhea 12/03/2007  . EPIGASTRIC PAIN 12/03/2007  . Hyperlipidemia 12/02/2007    Class: History of  . EXTERNAL HEMORRHOIDS 12/02/2007    Class: History of  . ESOPHAGITIS 12/02/2007  . GASTRITIS 12/02/2007  . COLITIS 12/02/2007  . DIVERTICULOSIS, COLON 12/02/2007  . ARTHRITIS 12/02/2007    Class: Chronic   Outpatient Encounter Medications as of 08/15/2017  Medication Sig  . amLODipine (NORVASC) 2.5 MG tablet Take 1 tablet (2.5 mg total) by mouth daily.  . Calcium Citrate-Vitamin D (CITRACAL + D PO) Take 1 tablet by mouth daily.  . Cholecalciferol (VITAMIN D) 1000 UNITS capsule Take 2,000 Units by mouth daily.   Marland Kitchen ELIQUIS 5 MG TABS tablet Take 1 tablet (5 mg total) by mouth 2 (two) times daily.  . ferrous sulfate 325 (65 FE) MG tablet Take 1 tablet (325 mg  total) by mouth 2 (two) times daily with a meal.  . gabapentin (NEURONTIN) 100 MG capsule TAKE 1 CAPSULE AT BEDTIME  . lisinopril (PRINIVIL,ZESTRIL) 20 MG tablet Take 1 tablet (20 mg total) by mouth 2 (two) times daily.  . methocarbamol (ROBAXIN) 500 MG tablet Take 1 tablet (500 mg total) by mouth 2 (two) times daily.  . metoprolol tartrate (LOPRESSOR) 50 MG tablet Take 1 tablet (50 mg total) by mouth 2 (two) times daily.  . Multiple  Vitamins-Minerals (CENTRUM SILVER ULTRA WOMENS PO) Take 1 tablet by mouth daily.  . nitroGLYCERIN (NITROSTAT) 0.4 MG SL tablet Place 1 tablet (0.4 mg total) under the tongue every 5 (five) minutes as needed for chest pain.  . pantoprazole (PROTONIX) 40 MG tablet Take 1 tablet (40 mg total) by mouth 2 (two) times daily.  Marland Kitchen PARoxetine (PAXIL) 20 MG tablet TAKE (1) TABLET DAILY IN THE MORNING.  . rosuvastatin (CRESTOR) 40 MG tablet Take 20 mg by mouth daily.  Marland Kitchen torsemide (DEMADEX) 20 MG tablet Take 20 mg by mouth daily as needed (edema).  . triamcinolone (KENALOG) 0.025 % ointment Apply 1 application topically 2 (two) times daily. On lower back rash   No facility-administered encounter medications on file as of 08/15/2017.      Review of Systems  Constitutional: Positive for fatigue.  HENT: Negative.   Eyes: Negative.   Respiratory: Negative.   Cardiovascular: Positive for palpitations.  Gastrointestinal: Positive for diarrhea, nausea and vomiting (started last night).  Endocrine: Negative.   Genitourinary: Negative.   Musculoskeletal: Negative.   Skin: Negative.   Allergic/Immunologic: Negative.   Neurological: Positive for dizziness (on-going) and weakness.  Hematological: Negative.   Psychiatric/Behavioral: Negative.        Objective:   Physical Exam  Constitutional: She is oriented to person, place, and time. She appears well-developed and well-nourished. She appears distressed.  HENT:  Head: Normocephalic and atraumatic.  Right Ear: External ear normal.  Left Ear: External ear normal.  Nose: Nose normal.  Mouth/Throat: Oropharynx is clear and moist. No oropharyngeal exudate.  Eyes: Pupils are equal, round, and reactive to light. Conjunctivae and EOM are normal. Right eye exhibits no discharge. Left eye exhibits no discharge. No scleral icterus.  Neck: Normal range of motion. Neck supple. No thyromegaly present.  No bruits thyromegaly or adenopathy  Cardiovascular: Normal  rate, normal heart sounds and intact distal pulses.  No murmur heard. Heart is irregular irregular at about 96/min  Pulmonary/Chest: Effort normal and breath sounds normal. She has no wheezes. She has no rales.  Abdominal: Soft. Bowel sounds are normal. She exhibits no distension and no mass. There is tenderness. There is no rebound and no guarding.  Generalized abdominal tenderness  Musculoskeletal: She exhibits no edema.  The patient is using a rolling walker today because of her weakness.  Lymphadenopathy:    She has no cervical adenopathy.  Neurological: She is alert and oriented to person, place, and time. She has normal reflexes.  Skin: Skin is warm and dry. No rash noted.  Psychiatric: Her behavior is normal. Judgment and thought content normal.  Patient's mood and affect are somewhat down just to her feeling bad with the palpitations dizziness and vomiting.  Nursing note and vitals reviewed.  BP 91/65 (BP Location: Right Arm)   Pulse 79   Temp (!) 96.6 F (35.9 C) (Oral)   Ht 5' 3"  (1.6 m)   Wt 156 lb (70.8 kg)   BMI 27.63 kg/m  Assessment & Plan:  1. Essential hypertension -The blood pressure is running a little on the low side.  The initial blood pressure was 91/65 and a repeat was 100/68. - BMP8+EGFR - CBC with Differential/Platelet - Hepatic function panel - EKG 12-Lead  2. Hyperlipidemia -Continue current treatment pending results of lab work - CBC with Differential/Platelet - Lipid panel - EKG 12-Lead  3. Vitamin D deficiency -Continue current treatment pending results of lab work - CBC with Differential/Platelet - VITAMIN D 25 Hydroxy (Vit-D Deficiency, Fractures)  4. Type 2 diabetes mellitus with diabetic neuropathy, without long-term current use of insulin (HCC) -We will follow-up on blood sugars and foot checks at next visit. - CBC with Differential/Platelet - Bayer DCA Hb A1c Waived  5. Paroxysmal atrial fibrillation, chads2 Vasc2 score of  5, on eliquis -Patient remains in atrial fibrillation at about 96/min - CBC with Differential/Platelet  6. Weakness -Check thyroid CBC and BMP and urinalysis - CBC with Differential/Platelet - Thyroid Panel With TSH - EKG 12-Lead - Urinalysis, Complete  7. Palpitations -The patient remains in atrial fibrillation at about 96/min   8. Viral gastroenteritis -Gradually increase diet with clear liquids full liquids and bland diet avoiding caffeine milk cheese ice cream and dairy products. Patient's niece-by marriage understands that if she gets weaker she should go to the emergency room to get some IV fluids.  9. Chronic diarrhea -Increase Imodium intake to 1 twice daily and follow-up gradual increase diet of clear liquids to full liquids and bland diet  10. CKD (chronic kidney disease) stage 3, GFR 30-59 ml/min (HCC) -Continue to avoid NSAIDs and only take Tylenol  11. Diabetes mellitus due to underlying condition with diabetic autonomic neuropathy, without long-term current use of insulin (HCC) -Check blood sugars regularly and make all efforts to get rehydrated.  Patient Instructions                       Medicare Annual Wellness Visit  [facility] and the medical providers at [facility] strive to bring you the best medical care.  In doing so we not only want to address your current medical conditions and concerns but also to detect new conditions early and prevent illness, disease and health-related problems.    Medicare offers a yearly Wellness Visit which allows our clinical staff to assess your need for preventative services including immunizations, lifestyle education, counseling to decrease risk of preventable diseases and screening for fall risk and other medical concerns.    This visit is provided free of charge (no copay) for all Medicare recipients. The clinical pharmacists at [facility] have begun to conduct these Wellness  Visits which will also include a thorough review of all your medications.    As you primary medical provider recommend that you make an appointment for your Annual Wellness Visit if you have not done so already this year.  You may set up this appointment before you leave today or you may call back ([phone]) and schedule an appointment.  Please make sure when you call that you mention that you are scheduling your Annual Wellness Visit with the clinical pharmacist so that the appointment may be made for the proper length of time.     Continue current medications. Continue good therapeutic lifestyle changes which include good diet and exercise. Fall precautions discussed with patient. If an FOBT was given today- please return it to our front desk. If you are over 22 years old -  you may need Prevnar 13 or the adult Pneumonia vaccine.  **Flu shots are available--- please call and schedule a FLU-CLINIC appointment**  After your visit with [place] today you will receive a survey in the mail or online from [employer] regarding your care with [place]. Please take a moment to fill this out. Your feedback is very important to [place] as you can help [place] better understand your patient needs as well as improve your experience and satisfaction. WE CARE ABOUT YOU!!!   Reduce lisinopril from 20 mg to 10 mg daily. Take imodium twice a day for the next 2 days.  Clear liquids for 24 hours (like 7-Up, ginger ale, Sprite, Jello, frozen pops) Full liquids the second 24-hours (like potato soup, tomato soup, chicken noodle soup) Bland diet the third 24-hours (boiled and baked foods, no fried or greasy foods) Avoid milk, cheese, ice cream and dairy products for 72 hours. Avoid caffeine (cola drinks, coffee, tea, Mountain Dew, Mellow Yellow) Take in small amounts, but frequently. Tylenol for aches pains and fever  If patient does not improve within the next couple days she may need to go to the emergency room and get some IV  fluids to get better.  We will give the patient information about a lifeline today that her children should consider getting for her.  Use Debrox for 2-3 nights in a row in the right ear canal and when we see the patient back next week we will do an ear irrigation to try to remove the wax from this ear canal.     Arrie Senate MD

## 2017-08-15 NOTE — Patient Instructions (Addendum)
Medicare Annual Wellness Visit  [facility] and the medical providers at [facility] strive to bring you the best medical care.  In doing so we not only want to address your current medical conditions and concerns but also to detect new conditions early and prevent illness, disease and health-related problems.    Medicare offers a yearly Wellness Visit which allows our clinical staff to assess your need for preventative services including immunizations, lifestyle education, counseling to decrease risk of preventable diseases and screening for fall risk and other medical concerns.    This visit is provided free of charge (no copay) for all Medicare recipients. The clinical pharmacists at [facility] have begun to conduct these Wellness Visits which will also include a thorough review of all your medications.    As you primary medical provider recommend that you make an appointment for your Annual Wellness Visit if you have not done so already this year.  You may set up this appointment before you leave today or you may call back ([phone]) and schedule an appointment.  Please make sure when you call that you mention that you are scheduling your Annual Wellness Visit with the clinical pharmacist so that the appointment may be made for the proper length of time.     Continue current medications. Continue good therapeutic lifestyle changes which include good diet and exercise. Fall precautions discussed with patient. If an FOBT was given today- please return it to our front desk. If you are over 3 years old - you may need Prevnar 37 or the adult Pneumonia vaccine.  **Flu shots are available--- please call and schedule a FLU-CLINIC appointment**  After your visit with [place] today you will receive a survey in the mail or online from [employer] regarding your care with [place]. Please take a moment to fill this out. Your feedback is very  important to [place] as you can help [place] better understand your patient needs as well as improve your experience and satisfaction. WE CARE ABOUT YOU!!!   Reduce lisinopril from 20 mg to 10 mg daily. Take imodium twice a day for the next 2 days.  Clear liquids for 24 hours (like 7-Up, ginger ale, Sprite, Jello, frozen pops) Full liquids the second 24-hours (like potato soup, tomato soup, chicken noodle soup) Bland diet the third 24-hours (boiled and baked foods, no fried or greasy foods) Avoid milk, cheese, ice cream and dairy products for 72 hours. Avoid caffeine (cola drinks, coffee, tea, Mountain Dew, Mellow Yellow) Take in small amounts, but frequently. Tylenol for aches pains and fever  If patient does not improve within the next couple days she may need to go to the emergency room and get some IV fluids to get better.  We will give the patient information about a lifeline today that her children should consider getting for her.  Use Debrox for 2-3 nights in a row in the right ear canal and when we see the patient back next week we will do an ear irrigation to try to remove the wax from this ear canal.

## 2017-08-16 ENCOUNTER — Other Ambulatory Visit: Payer: Medicare Other

## 2017-08-16 ENCOUNTER — Other Ambulatory Visit: Payer: Self-pay | Admitting: Family Medicine

## 2017-08-16 DIAGNOSIS — R531 Weakness: Secondary | ICD-10-CM | POA: Diagnosis not present

## 2017-08-16 LAB — HEPATIC FUNCTION PANEL
ALK PHOS: 83 IU/L (ref 39–117)
ALT: 17 IU/L (ref 0–32)
AST: 13 IU/L (ref 0–40)
Albumin: 4.3 g/dL (ref 3.5–4.7)
BILIRUBIN TOTAL: 0.6 mg/dL (ref 0.0–1.2)
BILIRUBIN, DIRECT: 0.18 mg/dL (ref 0.00–0.40)
Total Protein: 6.9 g/dL (ref 6.0–8.5)

## 2017-08-16 LAB — CBC WITH DIFFERENTIAL/PLATELET
BASOS: 0 %
Basophils Absolute: 0 10*3/uL (ref 0.0–0.2)
EOS (ABSOLUTE): 0.3 10*3/uL (ref 0.0–0.4)
EOS: 3 %
HEMATOCRIT: 42 % (ref 34.0–46.6)
HEMOGLOBIN: 14.2 g/dL (ref 11.1–15.9)
IMMATURE GRANULOCYTES: 1 %
Immature Grans (Abs): 0.1 10*3/uL (ref 0.0–0.1)
LYMPHS: 16 %
Lymphocytes Absolute: 1.4 10*3/uL (ref 0.7–3.1)
MCH: 31.1 pg (ref 26.6–33.0)
MCHC: 33.8 g/dL (ref 31.5–35.7)
MCV: 92 fL (ref 79–97)
MONOCYTES: 8 %
Monocytes Absolute: 0.7 10*3/uL (ref 0.1–0.9)
NEUTROS PCT: 72 %
Neutrophils Absolute: 6.5 10*3/uL (ref 1.4–7.0)
PLATELETS: 280 10*3/uL (ref 150–450)
RBC: 4.57 x10E6/uL (ref 3.77–5.28)
RDW: 13.1 % (ref 12.3–15.4)
WBC: 9 10*3/uL (ref 3.4–10.8)

## 2017-08-16 LAB — URINALYSIS, COMPLETE
Bilirubin, UA: NEGATIVE
Glucose, UA: NEGATIVE
Ketones, UA: NEGATIVE
NITRITE UA: NEGATIVE
PROTEIN UA: NEGATIVE
RBC, UA: NEGATIVE
UUROB: 0.2 mg/dL (ref 0.2–1.0)
pH, UA: 6 (ref 5.0–7.5)

## 2017-08-16 LAB — LIPID PANEL
CHOL/HDL RATIO: 2.5 ratio (ref 0.0–4.4)
Cholesterol, Total: 165 mg/dL (ref 100–199)
HDL: 65 mg/dL (ref >39)
LDL Calculated: 73 mg/dL (ref 0–99)
Triglycerides: 134 mg/dL (ref 0–149)
VLDL Cholesterol Cal: 27 mg/dL (ref 5–40)

## 2017-08-16 LAB — VITAMIN D 25 HYDROXY (VIT D DEFICIENCY, FRACTURES): Vit D, 25-Hydroxy: 21.2 ng/mL — ABNORMAL LOW (ref 30.0–100.0)

## 2017-08-16 LAB — BMP8+EGFR
BUN/Creatinine Ratio: 17 (ref 12–28)
BUN: 30 mg/dL — ABNORMAL HIGH (ref 8–27)
CALCIUM: 10.1 mg/dL (ref 8.7–10.3)
CO2: 18 mmol/L — AB (ref 20–29)
CREATININE: 1.78 mg/dL — AB (ref 0.57–1.00)
Chloride: 100 mmol/L (ref 96–106)
GFR, EST AFRICAN AMERICAN: 30 mL/min/{1.73_m2} — AB (ref >59)
GFR, EST NON AFRICAN AMERICAN: 26 mL/min/{1.73_m2} — AB (ref >59)
Glucose: 206 mg/dL — ABNORMAL HIGH (ref 65–99)
Potassium: 4.7 mmol/L (ref 3.5–5.2)
Sodium: 135 mmol/L (ref 134–144)

## 2017-08-16 LAB — THYROID PANEL WITH TSH
Free Thyroxine Index: 2.1 (ref 1.2–4.9)
T3 UPTAKE RATIO: 28 % (ref 24–39)
T4, Total: 7.4 ug/dL (ref 4.5–12.0)
TSH: 5.81 u[IU]/mL — ABNORMAL HIGH (ref 0.450–4.500)

## 2017-08-16 LAB — MICROSCOPIC EXAMINATION: RENAL EPITHEL UA: NONE SEEN /HPF

## 2017-08-17 ENCOUNTER — Telehealth: Payer: Self-pay | Admitting: Family Medicine

## 2017-08-17 NOTE — Telephone Encounter (Signed)
Spoke with pt  She feels better, no more vomiting and is getting stronger.  She will try a bland diet today and see how she does. She will see [place] and have labs next Thurs 08/23/17.

## 2017-08-23 ENCOUNTER — Encounter: Payer: Self-pay | Admitting: Family Medicine

## 2017-08-23 ENCOUNTER — Ambulatory Visit (INDEPENDENT_AMBULATORY_CARE_PROVIDER_SITE_OTHER): Payer: Medicare Other | Admitting: Family Medicine

## 2017-08-23 VITALS — BP 126/73 | HR 57 | Temp 97.3°F | Ht 63.0 in | Wt 165.0 lb

## 2017-08-23 DIAGNOSIS — N183 Chronic kidney disease, stage 3 unspecified: Secondary | ICD-10-CM

## 2017-08-23 DIAGNOSIS — H6121 Impacted cerumen, right ear: Secondary | ICD-10-CM | POA: Diagnosis not present

## 2017-08-23 DIAGNOSIS — I2510 Atherosclerotic heart disease of native coronary artery without angina pectoris: Secondary | ICD-10-CM

## 2017-08-23 DIAGNOSIS — R1011 Right upper quadrant pain: Secondary | ICD-10-CM | POA: Diagnosis not present

## 2017-08-23 DIAGNOSIS — R531 Weakness: Secondary | ICD-10-CM | POA: Diagnosis not present

## 2017-08-23 DIAGNOSIS — A084 Viral intestinal infection, unspecified: Secondary | ICD-10-CM

## 2017-08-23 DIAGNOSIS — R002 Palpitations: Secondary | ICD-10-CM

## 2017-08-23 NOTE — Progress Notes (Addendum)
Subjective:    Patient ID: [name], female    DOB: 1931-01-21, 84 y.o.   MRN: [id number]  HPI  Patient here today for 1 week follow up on viral gastroenteritis and weakness.  The patient looks better is smiling and is feeling better today 2.  She still having a lot of trouble with her heart racing and she is still weak.  She is not having as many loose stools but still about 5 daily which is according to her her normal.  She has not had any more nausea and vomiting.  Lab work that was done on the recent visit when it was thought she had a viral gastroenteritis was reviewed with her during the visit.  The urine specimen was clear of any infection and red blood cells.  The hemoglobin A1c was stable at 7.2%.  The blood sugar itself however was 206.  The creatinine however was more elevated at 1.78 and previously was 1.23.  We will repeat this today as recommended by Dr. Stanford Breed.  All of the electrolytes including potassium are good.  The CBC was within normal limits and stable with a good hemoglobin and normal white blood cell count.  All liver function tests were normal.  The vitamin D level was low but no treatment is recommended to do that right as in the creatinine.  The TSH was also elevated and I explained to the patient this was only a slight elevation and we will just plan to repeat this again in about 6 weeks and if it continues to rise she will need thyroid replacement.  All cholesterol numbers were good with an LDL C being 73 HDL being 65 and triglycerides being 134.  The patient does come to need to complain with earwax and weakness and palpitations.  She also complains of right upper quadrant abdominal pain but not associated with nausea.  The pain is a dull pain and more persistent and not associated with eating.   Patient Active Problem List   Diagnosis Date Noted  . History of drug-induced prolonged QT interval with torsade de pointes 11/01/2016  . Iron deficiency anemia  10/30/2016  . Long term current use of anticoagulant 02/17/2016  . Near syncope 05/22/2015  . Diabetes mellitus type 2, diet-controlled ([place]) 09/14/2014  . ICD (implantable cardioverter-defibrillator) battery depletion 01/20/2014  . Abnormal nuclear stress test, 12/30/13 12/31/2013  . CAD in native artery 12/31/2013  . Unstable angina, neg MI, cath stable.maybe GI 12/30/2013  . Paroxysmal atrial fibrillation, chads2 Vasc2 score of 5, on eliquis 10/16/2013  . Catecholaminergic polymorphic ventricular tachycardia ([place]) 10/16/2013  . Ureteral stone with hydronephrosis 05/20/2013  . Metabolic syndrome 91/47/8295  . Hypotension, meds stopped this admission 07/29/2011  . Orthostatic hypotension 07/27/2011  . Chest pain, r/o cardiac, negative MI 07/25/2011  . Chronic sinus bradycardia 07/25/2011  . ICD (implantable cardioverter-defibrillator) in place 07/25/2011  . CKD (chronic kidney disease) stage 3, GFR 30-59 ml/min ([facility]) 07/25/2011    Class: Chronic  . History of DVT in the past, on eliquis now 07/25/2011    Class: History of  . Nephrolithiasis, just saw Dr [name] 07/25/2011    Class: History of  . DYSPHAGIA 03/24/2010  . Chronic ischemic heart disease 12/03/2007  . RECTAL BLEEDING 12/03/2007  . Irritable bowel syndrome with diarrhea 12/03/2007  . EPIGASTRIC PAIN 12/03/2007  . Hyperlipidemia 12/02/2007    Class: History of  . EXTERNAL HEMORRHOIDS 12/02/2007    Class: History of  . ESOPHAGITIS 12/02/2007  .  GASTRITIS 12/02/2007  . COLITIS 12/02/2007  . DIVERTICULOSIS, COLON 12/02/2007  . ARTHRITIS 12/02/2007    Class: Chronic   Outpatient Encounter Medications as of 08/23/2017  Medication Sig  . amLODipine (NORVASC) 2.5 MG tablet Take 1 tablet (2.5 mg total) by mouth daily.  . Calcium Citrate-Vitamin D (CITRACAL + D PO) Take 1 tablet by mouth daily.  . Cholecalciferol (VITAMIN D) 1000 UNITS capsule Take 2,000 Units by mouth daily.   Marland Kitchen ELIQUIS 5 MG TABS tablet Take 1 tablet  (5 mg total) by mouth 2 (two) times daily.  . ferrous sulfate 325 (65 FE) MG tablet Take 1 tablet (325 mg total) by mouth 2 (two) times daily with a meal.  . gabapentin (NEURONTIN) 100 MG capsule TAKE 1 CAPSULE AT BEDTIME  . lisinopril (PRINIVIL,ZESTRIL) 20 MG tablet Take 1 tablet (20 mg total) by mouth 2 (two) times daily.  . methocarbamol (ROBAXIN) 500 MG tablet Take 1 tablet (500 mg total) by mouth 2 (two) times daily.  . metoprolol tartrate (LOPRESSOR) 50 MG tablet Take 1 tablet (50 mg total) by mouth 2 (two) times daily.  . Multiple Vitamins-Minerals (CENTRUM SILVER ULTRA WOMENS PO) Take 1 tablet by mouth daily.  . nitroGLYCERIN (NITROSTAT) 0.4 MG SL tablet Place 1 tablet (0.4 mg total) under the tongue every 5 (five) minutes as needed for chest pain.  . pantoprazole (PROTONIX) 40 MG tablet Take 1 tablet (40 mg total) by mouth 2 (two) times daily.  Marland Kitchen PARoxetine (PAXIL) 20 MG tablet TAKE (1) TABLET DAILY IN THE MORNING.  . rosuvastatin (CRESTOR) 40 MG tablet Take 20 mg by mouth daily.  Marland Kitchen torsemide (DEMADEX) 20 MG tablet Take 20 mg by mouth daily as needed (edema).  . triamcinolone (KENALOG) 0.025 % ointment Apply 1 application topically 2 (two) times daily. On lower back rash   No facility-administered encounter medications on file as of 08/23/2017.       Review of Systems  Constitutional: Negative.   HENT: Negative.   Eyes: Negative.   Respiratory: Negative.   Cardiovascular: Negative.   Gastrointestinal: Negative.   Endocrine: Negative.   Genitourinary: Negative.   Musculoskeletal: Negative.   Skin: Negative.   Allergic/Immunologic: Negative.   Neurological: Negative.   Hematological: Negative.   Psychiatric/Behavioral: Negative.        Objective:   Physical Exam  Constitutional: She is oriented to person, place, and time. She appears well-developed and well-nourished. No distress.  The patient is pleasant and feeling better though not feeling up to par.  She still has  weakness and she has had some right upper quadrant abdominal pain without nausea for a couple of days.  HENT:  Head: Normocephalic and atraumatic.  Left Ear: External ear normal.  Nose: Nose normal.  Mouth/Throat: Oropharynx is clear and moist.  Right ear canal still impacted with cerumen left ear canal is clear.  Eyes: Pupils are equal, round, and reactive to light. Conjunctivae and EOM are normal. Right eye exhibits no discharge. Left eye exhibits no discharge. No scleral icterus.  Neck: Normal range of motion. Neck supple. No thyromegaly present.  Cardiovascular: Normal rate, regular rhythm and normal heart sounds.  No murmur heard. The heart today actually has a regular rate and rhythm at 60/min  Pulmonary/Chest: Breath sounds normal. She has no wheezes. She has no rales.  Abdominal: Soft. Bowel sounds are normal. She exhibits no mass. There is no tenderness. There is no guarding.  There is slight right upper quadrant tenderness and epigastric tenderness.  There is no liver or spleen enlargement and recent liver function tests were normal.  Bowel sounds were normal.  No suprapubic tenderness.  Musculoskeletal: She exhibits no edema or tenderness.  Patient uses a cane for ambulation and stability.  There is clearly no pedal edema.  Lymphadenopathy:    She has no cervical adenopathy.  Neurological: She is alert and oriented to person, place, and time. She has normal reflexes. No cranial nerve deficit.  Skin: Skin is warm and dry. No rash noted.  Psychiatric: She has a normal mood and affect. Her behavior is normal. Judgment and thought content normal.  The patient is alert with normal mood and affect.  Nursing note and vitals reviewed.  BP 126/73 (BP Location: Left Arm)   Pulse (!) 57   Temp (!) 97.3 F (36.3 C) (Oral)   Ht 5' 3"  (1.6 m)   Wt 165 lb (74.8 kg)   BMI 29.23 kg/m        Assessment & Plan:  1. Weakness -Recent creatinine was more elevated than usual and may have  been due to the viral gastroenteritis that the patient had which is improved.  We will repeat this today and make sure the cardiologist was notified of the results and we will reduce the Eliquis if the creatinine remains elevated as they have recommended. - Thyroid Panel With TSH; Future - BMP8+EGFR  2. Right upper quadrant abdominal pain -There is slight tenderness in the right upper quadrant and epigastric area without any heartburn symptoms.  The patient will call us back again in about a week or sooner and if the abdominal pain continues we will get an ultrasound of the abdomen.  3. Right ear impacted cerumen -Gentle ear irrigation to remove cerumen that is impacted  4. Viral gastroenteritis -Symptoms from the viral gastroenteritis including vomiting and nausea and more frequent stools than usual appear to have improved.  5. Palpitations -The patient still complains of palpitations and on physical exam today her heart rate was good at 60/min and regular.  She does have an upcoming appointment with Dr. Sallyanne Kuster in September.  6. CKD (chronic kidney disease) stage 3, GFR 30-59 ml/min (HCC) -Creatinine was elevated at last visit and we will repeat BMP today.   Patient Instructions  The patient should continue with lots of fluids and is much rest as possible We will call with the results of the BMP and if the creatinine goes up we will need to reduce the Eliquis as recommended by Dr. Stanford Breed The patient should call back in a week and if the abdominal pain continues or gets worse she should call back sooner and we will get an ultrasound of the abdomen She should follow-up with the cardiologist as planned in September unless the palpitations get worse.  Today the rhythm was regular at 60/min. Because of the elevated TSH she will need a thyroid profile repeated in 6 weeks and may need thyroid replacement if the TSH continues to rise.  Arrie Senate MD

## 2017-08-23 NOTE — Patient Instructions (Signed)
The patient should continue with lots of fluids and is much rest as possible We will call with the results of the BMP and if the creatinine goes up we will need to reduce the Eliquis as recommended by Dr. Stanford Breed The patient should call back in a week and if the abdominal pain continues or gets worse she should call back sooner and we will get an ultrasound of the abdomen She should follow-up with the cardiologist as planned in September unless the palpitations get worse.  Today the rhythm was regular at 60/min. Because of the elevated TSH she will need a thyroid profile repeated in 6 weeks and may need thyroid replacement if the TSH continues to rise.

## 2017-08-24 LAB — BMP8+EGFR
BUN/Creatinine Ratio: 15 (ref 12–28)
BUN: 18 mg/dL (ref 8–27)
CO2: 21 mmol/L (ref 20–29)
Calcium: 9.1 mg/dL (ref 8.7–10.3)
Chloride: 104 mmol/L (ref 96–106)
Creatinine, Ser: 1.23 mg/dL — ABNORMAL HIGH (ref 0.57–1.00)
GFR calc Af Amer: 47 mL/min/{1.73_m2} — ABNORMAL LOW (ref >59)
GFR calc non Af Amer: 41 mL/min/{1.73_m2} — ABNORMAL LOW (ref >59)
Glucose: 140 mg/dL — ABNORMAL HIGH (ref 65–99)
Potassium: 4.8 mmol/L (ref 3.5–5.2)
Sodium: 138 mmol/L (ref 134–144)

## 2017-08-25 DIAGNOSIS — H40033 Anatomical narrow angle, bilateral: Secondary | ICD-10-CM | POA: Diagnosis not present

## 2017-08-25 DIAGNOSIS — H04123 Dry eye syndrome of bilateral lacrimal glands: Secondary | ICD-10-CM | POA: Diagnosis not present

## 2017-08-27 ENCOUNTER — Telehealth: Payer: Self-pay | Admitting: Family Medicine

## 2017-08-28 NOTE — Telephone Encounter (Signed)
Pt called - she feels some better today - she is aware that if palpitations cont. She should get in touch with her cardio DR

## 2017-08-29 LAB — CUP PACEART REMOTE DEVICE CHECK
Battery Remaining Longevity: 144 mo
Battery Remaining Percentage: 100 %
HIGH POWER IMPEDANCE MEASURED VALUE: 47 Ohm
Implantable Lead Implant Date: 20090120
Implantable Lead Location: 753860
Implantable Lead Model: 157
Lead Channel Pacing Threshold Pulse Width: 0.5 ms
Lead Channel Setting Pacing Pulse Width: 0.5 ms
Lead Channel Setting Sensing Sensitivity: 0.6 mV
MDC IDC LEAD SERIAL: 138237
MDC IDC MSMT LEADCHNL RV IMPEDANCE VALUE: 709 Ohm
MDC IDC MSMT LEADCHNL RV PACING THRESHOLD AMPLITUDE: 0.9 V
MDC IDC PG IMPLANT DT: 20160126
MDC IDC PG SERIAL: 191956
MDC IDC SESS DTM: 20190710044000
MDC IDC SET LEADCHNL RV PACING AMPLITUDE: 2.2 V
MDC IDC STAT BRADY RV PERCENT PACED: 1 %

## 2017-09-19 ENCOUNTER — Ambulatory Visit (INDEPENDENT_AMBULATORY_CARE_PROVIDER_SITE_OTHER): Payer: Medicare Other | Admitting: Cardiovascular Disease

## 2017-09-19 ENCOUNTER — Encounter: Payer: Self-pay | Admitting: Cardiovascular Disease

## 2017-09-19 VITALS — BP 136/68 | HR 56 | Ht 63.0 in | Wt 160.0 lb

## 2017-09-19 DIAGNOSIS — I472 Ventricular tachycardia: Secondary | ICD-10-CM

## 2017-09-19 DIAGNOSIS — I4729 Other ventricular tachycardia: Secondary | ICD-10-CM

## 2017-09-19 DIAGNOSIS — I951 Orthostatic hypotension: Secondary | ICD-10-CM | POA: Diagnosis not present

## 2017-09-19 DIAGNOSIS — E7800 Pure hypercholesterolemia, unspecified: Secondary | ICD-10-CM | POA: Diagnosis not present

## 2017-09-19 DIAGNOSIS — I2510 Atherosclerotic heart disease of native coronary artery without angina pectoris: Secondary | ICD-10-CM | POA: Diagnosis not present

## 2017-09-19 DIAGNOSIS — Z95810 Presence of automatic (implantable) cardiac defibrillator: Secondary | ICD-10-CM | POA: Diagnosis not present

## 2017-09-19 DIAGNOSIS — N183 Chronic kidney disease, stage 3 unspecified: Secondary | ICD-10-CM

## 2017-09-19 DIAGNOSIS — Z7901 Long term (current) use of anticoagulants: Secondary | ICD-10-CM | POA: Diagnosis not present

## 2017-09-19 DIAGNOSIS — E119 Type 2 diabetes mellitus without complications: Secondary | ICD-10-CM | POA: Diagnosis not present

## 2017-09-19 DIAGNOSIS — I480 Paroxysmal atrial fibrillation: Secondary | ICD-10-CM | POA: Diagnosis not present

## 2017-09-19 LAB — CUP PACEART INCLINIC DEVICE CHECK
Date Time Interrogation Session: 20200131143149
Implantable Lead Implant Date: 20090120
Implantable Lead Serial Number: 138237
Implantable Pulse Generator Implant Date: 20160126
MDC IDC LEAD LOCATION: 753860
Pulse Gen Serial Number: 191956

## 2017-09-19 NOTE — Patient Instructions (Signed)
Medication Instructions: Dr Sallyanne Kuster recommends that you continue on your current medications as directed. Please refer to the Current Medication list given to you today.  Labwork: NONE ORDERED  Testing/Procedures: 1. Echocardiogram - Your physician has requested that you have an echocardiogram. Echocardiography is a painless test that uses sound waves to create images of your heart. It provides your doctor with information about the size and shape of your heart and how well your heart's chambers and valves are working. This procedure takes approximately one hour. There are no restrictions for this procedure.  2. 30-day Cardiac Event Monitor - Your physician has recommended that you wear an event monitor. Event monitors are medical devices that record the heart's electrical activity. Doctors most often [place] these monitors to diagnose arrhythmias. Arrhythmias are problems with the speed or rhythm of the heartbeat. The monitor is a small, portable device. You can wear one while you do your normal daily activities. This is usually used to diagnose what is causing palpitations/syncope (passing out).  >> These will be performed/placed at our [facility] location [place], [place] [address] [phone]  Follow-up: Dr Sallyanne Kuster recommends that you schedule a follow-up appointment in 3 months.  If you need a refill on your cardiac medications before your next appointment, please call your pharmacy.

## 2017-09-19 NOTE — Progress Notes (Signed)
. Patient ID: [name], female   DOB: 07-04-1931, 84 y.o.   MRN: [id number]    Cardiology Office Note    Date:  09/19/2017   ID:  [name], DOB 07/06/1931, MRN [id number]  PCP:  Renee Herb, MD  Cardiologist:   Sanda Klein, MD   No chief complaint on file.   History of Present Illness:  [name] is a 84 y.o. 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  She continues to have intermittent episodes of weakness which she calls "atrial fibrillation".  We did indeed document atrial fibrillation with well-controlled ventricular response in the office in February of this year during 1 of her "spells".  Recommended an event monitor to see what the overall burden of arrhythmia is, since she only has a single-chamber defibrillator that does not show any episodes of high ventricular rates.  She canceled the event monitor.  Offered symptomatic therapy with an antiarrhythmic (specifically amiodarone), but she declined due to the high side effect profile.    She is compliant with anticoagulation with Eliquis and has not had any falls or bleeding complications.  Despite continuing the anticoagulation, her hemoglobin has returned and stayed at normal levels 14.2.  She is still taking iron supplements.  Again today her single-chamber defibrillator shows no episodes of high ventricular rates.  She has not received any treatment for ventricular arrhythmias.  Her device is a  Office manager device was implanted as a generator change in 2016.  Estimated generator longevity is 12 years.  She never requires ventricular pacing.  Herr complaints suggestive of orthostatic hypotension. Still having problems with back pain.  She denies any issues with angina or dyspnea either at rest or with activity, except for dyspnea doing her "spells).  She never has leg edema.  Memory appears to be deteriorating which is confirmed by the friend who brought her to the appointment today.  Shee was on treatment with disopyramide for years for her history of catecholaminergic polymorphic VT, but this medication had to be gradually discontinued due to side effects, especially orthostatic hypotension and dry mouth.  In the past she developed torsades de pointes with sotalol and diarrhea with quinidine.   Past Medical History:  Diagnosis Date  . AICD (automatic cardioverter/defibrillator) present   . Allergy    SESONAL  . Anxiety   . ARTHRITIS   . Arthritis   . Atrial fibrillation ([place])   . CAD (coronary artery disease)    a. history of cardiac arrest 1993;  b. s/p LAD/LCX stenting in 2003;  c. 07/2011 Cath: LM nl, LAD patent stent, D1 80ost, LCX patent stent, RCA min irregs;  d. 04/2012 MV: EF 66%, no ishcemia;  e. 12/2013 Echo: Ef 55%, no rwma, Gr 1 DD, triv AI/MR, mildly dil LA;  f. 12/2013 Lexi MV: intermediate risk - apical ischemia and inf/infsept fixed defect, ? artifactual.  . CAD in native artery 12/31/2013   Previous stents to LAD and Ramus patent on cath today 12/31/13 also There is severe disease in the ostial first diagonal which is unchanged  from most recent cardiac catheterization. The right coronary artery could not be engaged selectively but nonselective angiography showed no significant disease in the proximal and midsegment.     . Cataract    DENIES  . Chronic back pain   . Chronic sinus bradycardia   . CKD (chronic kidney disease), stage III ([place])   . Clotting  disorder (HCC)    DVT  . Diabetes mellitus without complication ([place])    DENIES  . Diverticulosis of colon (without mention of hemorrhage) 2007   Colonoscopy   . Esophageal stricture    a. 2012 s/p dil.  . Esophagitis, unspecified    a. 2012 EGD  . EXTERNAL HEMORRHOIDS   . GERD (gastroesophageal reflux disease)    omeprazole  . H/O hiatal hernia   . Hiatal hernia    a. 2012 EGD.  Marland Kitchen History of DVT in the past, not on Coumadin now    left  leg  . HYPERCHOLESTEROLEMIA   . Hypertension   . ICD (implantable cardiac defibrillator) in place    a. s/p initial ICD in 1993 in setting of cardiac arrest;  b. 01/2007 gen change: Guidant T135 Vitality DS VR single lead ICD.  . Macular degeneration    gets injecton in eye every 5 weeks- last injection - 05/03/2013   . Myocardial infarction ([place]) 1993  . Nephrolithiasis, just saw Dr [name]   . Orthostatic hypotension   . Peripheral vascular disease ([place])    ???  . Unspecified gastritis and gastroduodenitis without mention of hemorrhage    a. 2003 EGD->not noted on 2012 EGD.    Past Surgical History:  Procedure Laterality Date  . BREAST BIOPSY Left 11/2013  . CARDIAC CATHETERIZATION    . CARDIAC CATHETERIZATION  01/01/15   patent stents -there is severe disease in ostial 1st diag but without change.   Marland Kitchen CARDIAC DEFIBRILLATOR PLACEMENT  02/05/2007   Guidant  . CARDIOVASCULAR STRESS TEST  12/30/2013   abnormal  . COLONOSCOPY    . coronary stents     . CYSTOSCOPY WITH URETEROSCOPY Right 05/20/2013   Procedure: CYSTOSCOPY, RIGHT URETEROSCOPY STONE EXTRACTION, Insertion of right DOUBLE J STENT ;  Surgeon: Irine Seal, MD;  Location: [facility];  Service: Urology;  Laterality: Right;  . CYSTOSCOPY WITH URETEROSCOPY AND STENT PLACEMENT N/A 06/03/2013   Procedure: SECOND LOOK CYSTOSCOPY WITH URETEROSCOPY  HOLMIUM LASER LITHO AND STONE EXTRACTION Sammie Bench ;  Surgeon: Malka So, MD;  Location: [facility];  Service: Urology;  Laterality: N/A;  . GIVENS  CAPSULE STUDY N/A 10/30/2016   Procedure: GIVENS CAPSULE STUDY;  Surgeon: Irene Shipper, MD;  Location: [facility];  Service: Endoscopy;  Laterality: N/A;  NEEDS TO BE ADMITTED FOR OBSERVATION PT. HAS DEFIB  . HEMORROIDECTOMY  80's  . HOLMIUM LASER APPLICATION Right 0/06/3014   Procedure: HOLMIUM LASER APPLICATION;  Surgeon: Irine Seal, MD;  Location: [facility];  Service: Urology;  Laterality: Right;  . HYSTEROSCOPY W/D&C  09/02/2010   Procedure: DILATATION AND CURETTAGE (D&C) /HYSTEROSCOPY;  Surgeon: Margarette Asal;  Location: [facility];  Service: Gynecology;  Laterality: N/A;  Dilation and Curettage with Hysteroscopy and Polypectomy  . IMPLANTABLE CARDIOVERTER DEFIBRILLATOR (ICD) GENERATOR CHANGE N/A 02/10/2014   Procedure: ICD GENERATOR CHANGE;  Surgeon: Sanda Klein, MD;  Location: [facility];  Service: Cardiovascular;  Laterality: N/A;  . LEFT HEART CATHETERIZATION WITH CORONARY ANGIOGRAM N/A 07/28/2011   Procedure: LEFT HEART CATHETERIZATION WITH CORONARY ANGIOGRAM;  Surgeon: Lorretta Harp, MD;  Location: [facility];  Service: Cardiovascular;  Laterality: N/A;  . LEFT HEART CATHETERIZATION WITH CORONARY ANGIOGRAM N/A 12/31/2013   Procedure: LEFT HEART CATHETERIZATION WITH CORONARY ANGIOGRAM;  Surgeon: Wellington Hampshire, MD;  Location: [facility];  Service: Cardiovascular;  Laterality: N/A;    Current Medications: Outpatient Medications Prior to Visit  Medication Sig Dispense Refill  . amLODipine (NORVASC) 2.5 MG tablet Take 1 tablet (2.5 mg total) by mouth daily. 90 tablet 3  . Calcium Citrate-Vitamin D (CITRACAL + D PO) Take 1 tablet by mouth daily.    . Cholecalciferol (VITAMIN D) 1000 UNITS capsule Take 2,000 Units by mouth daily.     Marland Kitchen ELIQUIS 5 MG TABS tablet Take 1 tablet (5 mg total) by mouth 2 (two) times daily. 180 tablet 1  . ferrous sulfate 325 (65 FE) MG tablet Take 1 tablet (325 mg total) by mouth 2 (two) times daily with a meal. 180 tablet 3  . gabapentin (NEURONTIN) 100  MG capsule TAKE 1 CAPSULE AT BEDTIME 90 capsule 0  . lisinopril (PRINIVIL,ZESTRIL) 20 MG tablet Take 1 tablet (20 mg total) by mouth 2 (two) times daily. 180 tablet 3  . methocarbamol (ROBAXIN) 500 MG tablet Take 1 tablet (500 mg total) by mouth 2 (two) times daily. 20 tablet 0  . metoprolol tartrate (LOPRESSOR) 50 MG tablet Take 1 tablet (50 mg total) by mouth 2 (two) times daily. 180 tablet 3  . Multiple Vitamins-Minerals (CENTRUM SILVER ULTRA WOMENS PO) Take 1 tablet by mouth daily.    . nitroGLYCERIN (NITROSTAT) 0.4 MG SL tablet Place 1 tablet (0.4 mg total) under the tongue every 5 (five) minutes as needed for chest pain. 25 tablet 4  . pantoprazole (PROTONIX) 40 MG tablet Take 1 tablet (40 mg total) by mouth 2 (two) times daily. 180 tablet 3  . PARoxetine (PAXIL) 20 MG tablet TAKE (1) TABLET DAILY IN THE MORNING. 90 tablet 0  . rosuvastatin (CRESTOR) 40 MG tablet Take 20 mg by mouth daily.    Marland Kitchen torsemide (DEMADEX) 20 MG tablet Take 20 mg by mouth daily as needed (edema).    . triamcinolone (KENALOG) 0.025 % ointment Apply 1 application topically 2 (two) times daily. On lower back rash 80 g 0   No facility-administered medications prior to visit.      Allergies:   Sotalol; Bactrim [sulfamethoxazole-trimethoprim]; Actos [pioglitazone]; Adhesive [tape]; Contrast media [iodinated diagnostic agents]; Latex; and Lipitor [atorvastatin]   Social History   Socioeconomic History  . Marital status: Widowed    Spouse name: Not on file  . Number of children: Not on file  . Years of education: Not on file  . Highest education level: Not on file  Occupational History  . Not on file  Social Needs  . Financial resource strain: Not on file  . Food insecurity:    Worry: Not on file    Inability: Not on file  . Transportation needs:    Medical: Not on file    Non-medical: Not on file  Tobacco Use  . Smoking status: Never Smoker  . Smokeless tobacco: Never Used  Substance and Sexual Activity    . Alcohol use: No  . Drug use: No  . Sexual activity: Never    Birth control/protection: Post-menopausal  Lifestyle  . Physical activity:    Days per week: Not on file    Minutes per session: Not on file  . Stress: Not on file  Relationships  . Social connections:  Talks on phone: Not on file    Gets together: Not on file    Attends religious service: Not on file    Active member of club or organization: Not on file    Attends meetings of clubs or organizations: Not on file    Relationship status: Not on file  Other Topics Concern  . Not on file  Social History Narrative  . Not on file     Family History:  The patient's family history includes Asthma in her brother; Atrial fibrillation in her sister; Cancer in her daughter; Colon polyps in her daughter; Diabetes in her daughter; Heart attack in her brother and father; Heart disease in her brother; Hyperlipidemia in her sister; Macular degeneration in her sister; Osteoporosis in her sister; Other in her father and mother; Stroke in her mother, sister, and sister; Uterine cancer in her sister.   ROS:   Please see the history of present illness.    ROS all other systems are reviewed and are negative  PHYSICAL EXAM:   VS:  BP 136/68 (BP Location: Left Arm, Patient Position: Sitting, Cuff Size: Normal)   Pulse (!) 56   Ht 5\' 3"  (1.6 m)   Wt 160 lb (72.6 kg)   BMI 28.34 kg/m      General: Alert, oriented x3, no distress, appears comfortable.  Left subclavian defibrillator site is well-healed. Head: no evidence of trauma, PERRL, EOMI, no exophtalmos or lid lag, no myxedema, no xanthelasma; normal ears, nose and oropharynx Neck: normal jugular venous pulsations and no hepatojugular reflux; brisk carotid pulses without delay and no carotid bruits Chest: clear to auscultation, no signs of consolidation by percussion or palpation, normal fremitus, symmetrical and full respiratory excursions Cardiovascular: normal position and  quality of the apical impulse, regular rhythm, normal first and second heart sounds, no murmurs, rubs or gallops Abdomen: no tenderness or distention, no masses by palpation, no abnormal pulsatility or arterial bruits, normal bowel sounds, no hepatosplenomegaly Extremities: no clubbing, cyanosis or edema; 2+ radial, ulnar and brachial pulses bilaterally; 2+ right femoral, posterior tibial and dorsalis pedis pulses; 2+ left femoral, posterior tibial and dorsalis pedis pulses; no subclavian or femoral bruits Neurological: grossly nonfocal Psych: Normal mood and affect   Wt Readings from Last 3 Encounters:  09/19/17 160 lb (72.6 kg)  08/23/17 165 lb (74.8 kg)  08/15/17 156 lb (70.8 kg)    Studies/Labs Reviewed:   EKG:  EKG is not ordered today.  Intracardiac electrogram shows regular rhythm. Recent Labs: 12/13/2016: BNP 100.4 08/15/2017: ALT 17; Hemoglobin 14.2; Platelets 280; TSH 5.810 08/23/2017: BUN 18; Creatinine, Ser 1.23; Potassium 4.8; Sodium 138   Lipid Panel    Component Value Date/Time   CHOL 165 08/15/2017 1115   CHOL 140 07/31/2012 1019   TRIG 134 08/15/2017 1115   TRIG 115 06/07/2015 1111   TRIG 161 (H) 07/31/2012 1019   HDL 65 08/15/2017 1115   HDL 68 06/07/2015 1111   HDL 43 07/31/2012 1019   CHOLHDL 2.5 08/15/2017 1115   CHOLHDL 2.8 09/02/2010 0629   VLDL 34 09/02/2010 0629   LDLCALC 73 08/15/2017 1115   LDLCALC 65 09/09/2013 0841   LDLCALC 65 07/31/2012 1019     ASSESSMENT:    1. Paroxysmal atrial fibrillation, chads2 Vasc2 score of 5, on eliquis   2. Long term current use of anticoagulant   3. Polymorphic ventricular tachycardia ([place])   4. Coronary artery disease involving native coronary artery of native heart without angina pectoris  5. Orthostatic hypotension   6. Diabetes mellitus type 2 in nonobese (HCC)   7. CKD (chronic kidney disease) stage 3, GFR 30-59 ml/min (HCC)   8. ICD (implantable cardioverter-defibrillator) in place   9.  Hypercholesterolemia      PLAN:  In order of problems listed above:  1. AFib: Is quite possible that she indeed has atrial fibrillation as the sole cause for her "spells" of weakness.  This was corroborated by her electrocardiogram in the office in February, but I am still not entirely sure that she is having sufficiently frequent atrial fibrillation to explain all her complaints.  Rate control is clearly excellent as documented by her ventricular pacemaker lead.  She is fully anticoagulated. CHADSVasc 4 (age 57, gender, DM).  Offered antiarrhythmic therapy for symptom relief again, making it clear that this will not have impact on longevity or reduce the risk of stroke and the need for anticoagulation.  It would simply be for symptom relief.  She again reiterated that she would rather not take medicines with high risk such as amiodarone.  Discussed potentially using Multaq but she is reluctant to take this either.  Due to the potential side effects and due to her history of polymorphic VT and CAD (and side effects with other antiarrhythmics in the past) there is really no other good antiarrhythmic choice.  Will continue strategy of rate control only.  Advised her to go ahead with the event monitor to make sure that the A. fib explains all her complaints.  We will also like to repeat her echocardiogram to take another look at left atrial size and left ventricular systolic function. 2. Eliquis: No history of TIA or stroke or other systemic embolism.  Microcytic/iron deficiency anemia has resolved.  She continues to take iron supplements. 3. VT: She has not had any defibrillator therapy or any recurrence of VT since her defibrillator implantation in 1993, despite the fact that we stopped disopyramide therapy a couple of years ago 4. CAD: She does not have angina pectoris at rest or with activity.  She does not have angina pectoris and has not required revascularization since 2003.  Her last assessment was a  cardiac catheterization 2015 that did not show any significant coronary stenoses. 5. HTN/Orthostatic hypotension: Seems to be a little better.  Her dose of lisinopril was changed to 10 mg twice daily a couple of weeks ago.  I can tell if her residual complaints of dizziness are related to orthostatic hypotension or may be episodes of atrial fibrillation. 6. DM: Diet controlled 7. CKD: Most recent labs show an uptake in her creatinine which is up to 1.78.  The BUN was also higher and I suspect that she was hypovolemic when those labs were drawn.  8. ICD: Normal single-chamber device function. Continue remote downloads every 3 months and at least yearly office visits. 9. HLP: On statin, LDL close to target less than 70.  Medication Adjustments/Labs and Tests Ordered: Current medicines are reviewed at length with the patient today.  Concerns regarding medicines are outlined above.  Medication changes, Labs and Tests ordered today are listed in the Patient Instructions below. Patient Instructions  Medication Instructions: Dr Sallyanne Kuster recommends that you continue on your current medications as directed. Please refer to the Current Medication list given to you today.  Labwork: NONE ORDERED  Testing/Procedures: 1. Echocardiogram - Your physician has requested that you have an echocardiogram. Echocardiography is a painless test that uses sound waves to create images of your heart.  It provides your doctor with information about the size and shape of your heart and how well your heart's chambers and valves are working. This procedure takes approximately one hour. There are no restrictions for this procedure.  2. 30-day Cardiac Event Monitor - Your physician has recommended that you wear an event monitor. Event monitors are medical devices that record the heart's electrical activity. Doctors most often [place] these monitors to diagnose arrhythmias. Arrhythmias are problems with the speed or rhythm of the  heartbeat. The monitor is a small, portable device. You can wear one while you do your normal daily activities. This is usually used to diagnose what is causing palpitations/syncope (passing out).  >> These will be performed/placed at our [facility] location [place], [place] [address] [phone]  Follow-up: Dr Sallyanne Kuster recommends that you schedule a follow-up appointment in 3 months.  If you need a refill on your cardiac medications before your next appointment, please call your pharmacy.    Signed, Sanda Klein, MD  09/19/2017 1:41 PM    [facility] [place], [place], [place]  [address] Phone: [phone]; Fax: [phone]

## 2017-09-24 ENCOUNTER — Telehealth: Payer: Self-pay | Admitting: Cardiovascular Disease

## 2017-09-24 NOTE — Telephone Encounter (Signed)
Spoke with patient and gave her diagnosis code for Echo and monitor. She is concerned that AFib will not be a covering diagnosis. Confirmed with billing Afib covered diagnosis for both her primary and secondary insurance. Advised patient

## 2017-09-24 NOTE — Telephone Encounter (Signed)
New Message        Patient would like a diagonosis code so that she can present it to her insurance company and they can give her a estmated cost for the Echo and the heart monitor. Pls call and advise.

## 2017-09-27 ENCOUNTER — Other Ambulatory Visit: Payer: Self-pay

## 2017-09-27 ENCOUNTER — Ambulatory Visit (INDEPENDENT_AMBULATORY_CARE_PROVIDER_SITE_OTHER): Payer: Medicare Other

## 2017-09-27 ENCOUNTER — Ambulatory Visit (HOSPITAL_COMMUNITY): Payer: Medicare Other | Attending: Cardiology

## 2017-09-27 DIAGNOSIS — I2510 Atherosclerotic heart disease of native coronary artery without angina pectoris: Secondary | ICD-10-CM | POA: Diagnosis not present

## 2017-09-27 DIAGNOSIS — Z86718 Personal history of other venous thrombosis and embolism: Secondary | ICD-10-CM | POA: Diagnosis not present

## 2017-09-27 DIAGNOSIS — I252 Old myocardial infarction: Secondary | ICD-10-CM | POA: Diagnosis not present

## 2017-09-27 DIAGNOSIS — N183 Chronic kidney disease, stage 3 (moderate): Secondary | ICD-10-CM | POA: Insufficient documentation

## 2017-09-27 DIAGNOSIS — E785 Hyperlipidemia, unspecified: Secondary | ICD-10-CM | POA: Diagnosis not present

## 2017-09-27 DIAGNOSIS — D649 Anemia, unspecified: Secondary | ICD-10-CM | POA: Diagnosis not present

## 2017-09-27 DIAGNOSIS — I480 Paroxysmal atrial fibrillation: Secondary | ICD-10-CM | POA: Insufficient documentation

## 2017-09-27 DIAGNOSIS — I428 Other cardiomyopathies: Secondary | ICD-10-CM | POA: Insufficient documentation

## 2017-10-01 ENCOUNTER — Telehealth: Payer: Self-pay | Admitting: Cardiovascular Disease

## 2017-10-01 NOTE — Telephone Encounter (Signed)
Patient calling for echo results 

## 2017-10-01 NOTE — Telephone Encounter (Signed)
Notes recorded by Sanda Klein, MD on 09/27/2017 at 3:35 PM EDT Normal heart pumping function, no serious valve problems. No evidence of fluid overload. No change in meds is recommended.  Pt informed of result along with MD recommendation. Pt verbalized understanding. No further questions .

## 2017-10-02 ENCOUNTER — Other Ambulatory Visit: Payer: Self-pay | Admitting: Urology

## 2017-10-02 DIAGNOSIS — N200 Calculus of kidney: Secondary | ICD-10-CM

## 2017-10-06 ENCOUNTER — Telehealth: Payer: Self-pay | Admitting: Cardiovascular Disease

## 2017-10-06 NOTE — Telephone Encounter (Signed)
Telephone note   Received call from [employer] stating patient was in A. Fib and having symptoms of weakness.  Called patient who stated she had been feeling weak, fatigued, and been having presyncope all day. Has not been able to do much today.  Rate in A. fib was 100.   Had a similar episode a few weeks ago and has not felt the same since.    Unclear when current episode of A. fib started and whether current symptoms are related to A. fib.  She has had A. fib in the past and has not had this degree or severity of symptoms.  Suspect that something else is causing her symptoms.  Advised her to try to get some rest tonight and see how she feels tomorrow.  Garrison Columbus, MD Cardiology

## 2017-10-10 ENCOUNTER — Ambulatory Visit (HOSPITAL_COMMUNITY): Payer: Medicare Other

## 2017-10-12 ENCOUNTER — Other Ambulatory Visit: Payer: Self-pay | Admitting: Family Medicine

## 2017-10-17 ENCOUNTER — Ambulatory Visit (HOSPITAL_COMMUNITY): Admission: RE | Admit: 2017-10-17 | Payer: Medicare Other | Source: Ambulatory Visit

## 2017-10-22 ENCOUNTER — Other Ambulatory Visit: Payer: Self-pay | Admitting: Family Medicine

## 2017-10-24 ENCOUNTER — Ambulatory Visit (INDEPENDENT_AMBULATORY_CARE_PROVIDER_SITE_OTHER): Payer: Medicare Other | Admitting: *Deleted

## 2017-10-24 DIAGNOSIS — I429 Cardiomyopathy, unspecified: Secondary | ICD-10-CM | POA: Diagnosis not present

## 2017-10-25 NOTE — Progress Notes (Signed)
Remote ICD transmission.   

## 2017-11-02 DIAGNOSIS — M25561 Pain in right knee: Secondary | ICD-10-CM | POA: Diagnosis not present

## 2017-11-02 DIAGNOSIS — M1711 Unilateral primary osteoarthritis, right knee: Secondary | ICD-10-CM | POA: Diagnosis not present

## 2017-11-02 DIAGNOSIS — H353124 Nonexudative age-related macular degeneration, left eye, advanced atrophic with subfoveal involvement: Secondary | ICD-10-CM | POA: Diagnosis not present

## 2017-11-02 DIAGNOSIS — H353113 Nonexudative age-related macular degeneration, right eye, advanced atrophic without subfoveal involvement: Secondary | ICD-10-CM | POA: Diagnosis not present

## 2017-11-02 DIAGNOSIS — H3562 Retinal hemorrhage, left eye: Secondary | ICD-10-CM | POA: Diagnosis not present

## 2017-11-02 DIAGNOSIS — H2512 Age-related nuclear cataract, left eye: Secondary | ICD-10-CM | POA: Diagnosis not present

## 2017-11-06 ENCOUNTER — Ambulatory Visit: Payer: Medicare Other

## 2017-11-07 ENCOUNTER — Telehealth: Payer: Self-pay

## 2017-11-07 ENCOUNTER — Telehealth: Payer: Self-pay | Admitting: Cardiovascular Disease

## 2017-11-07 DIAGNOSIS — I4891 Unspecified atrial fibrillation: Secondary | ICD-10-CM | POA: Diagnosis not present

## 2017-11-07 DIAGNOSIS — I10 Essential (primary) hypertension: Secondary | ICD-10-CM | POA: Diagnosis not present

## 2017-11-07 DIAGNOSIS — I454 Nonspecific intraventricular block: Secondary | ICD-10-CM | POA: Diagnosis not present

## 2017-11-07 NOTE — Telephone Encounter (Signed)
Per [place] Call 911 and send to Ms. Marks  They were dispatched 12:45

## 2017-11-07 NOTE — Telephone Encounter (Signed)
Patient called in with chest pain going down her arm and SOB  Told to call 911 and have EMS check her out on monitor  Patient said she would

## 2017-11-07 NOTE — Telephone Encounter (Signed)
Spoke with pt who states she has been having chest pain X 3 days. She report that its an aching feeling on left side that radiate down her arm. Pt states she was assessed by EMS and told to contact office to be seen. Pt is still having symptoms and advised to have son take her to ED for further evaluations. Pt verbalized understanding,

## 2017-11-08 ENCOUNTER — Telehealth: Payer: Self-pay

## 2017-11-08 NOTE — Telephone Encounter (Signed)
Opened in error

## 2017-11-10 ENCOUNTER — Other Ambulatory Visit: Payer: Self-pay | Admitting: Family Medicine

## 2017-11-10 ENCOUNTER — Other Ambulatory Visit: Payer: Self-pay | Admitting: Internal Medicine

## 2017-11-13 ENCOUNTER — Other Ambulatory Visit: Payer: Medicare Other

## 2017-11-13 ENCOUNTER — Telehealth: Payer: Self-pay | Admitting: Family Medicine

## 2017-11-13 ENCOUNTER — Other Ambulatory Visit: Payer: Self-pay | Admitting: *Deleted

## 2017-11-13 ENCOUNTER — Ambulatory Visit (INDEPENDENT_AMBULATORY_CARE_PROVIDER_SITE_OTHER): Payer: Medicare Other

## 2017-11-13 DIAGNOSIS — R300 Dysuria: Secondary | ICD-10-CM

## 2017-11-13 DIAGNOSIS — Z23 Encounter for immunization: Secondary | ICD-10-CM

## 2017-11-13 DIAGNOSIS — R399 Unspecified symptoms and signs involving the genitourinary system: Secondary | ICD-10-CM

## 2017-11-13 LAB — MICROSCOPIC EXAMINATION: WBC, UA: >30 /hpf — AB (ref 0–5)

## 2017-11-13 LAB — URINALYSIS, COMPLETE
Bilirubin, UA: NEGATIVE
Glucose, UA: NEGATIVE
Ketones, UA: NEGATIVE
Nitrite, UA: POSITIVE — AB
PH UA: 6 (ref 5.0–7.5)
Protein, UA: NEGATIVE
Specific Gravity, UA: 1.01 (ref 1.005–1.030)
Urobilinogen, Ur: 0.2 mg/dL (ref 0.2–1.0)

## 2017-11-13 LAB — CUP PACEART REMOTE DEVICE CHECK
Implantable Lead Location: 753860
Implantable Pulse Generator Implant Date: 20160126
MDC IDC LEAD IMPLANT DT: 20090120
MDC IDC LEAD SERIAL: 138237
MDC IDC SESS DTM: 20191029075844
Pulse Gen Serial Number: 191956

## 2017-11-15 ENCOUNTER — Other Ambulatory Visit: Payer: Self-pay | Admitting: Nurse Practitioner

## 2017-11-15 LAB — URINE CULTURE

## 2017-11-15 MED ORDER — CIPROFLOXACIN HCL 500 MG PO TABS: 500.0000 mg | ORAL_TABLET | Freq: Two times a day (BID) | ORAL | 0 refills | Status: DC

## 2017-11-15 NOTE — Telephone Encounter (Signed)
Patient aware that urine culture grew bacteria and that antibiotic was sent to [facility].

## 2017-11-15 NOTE — Telephone Encounter (Signed)
Urine grew ecoli will call rx into pharmacy

## 2017-11-16 ENCOUNTER — Other Ambulatory Visit: Payer: Self-pay | Admitting: *Deleted

## 2017-11-16 NOTE — Progress Notes (Signed)
Pt aware - cipro sent in yesterday

## 2017-11-28 ENCOUNTER — Other Ambulatory Visit: Payer: Self-pay | Admitting: Family Medicine

## 2017-11-28 DIAGNOSIS — Z1231 Encounter for screening mammogram for malignant neoplasm of breast: Secondary | ICD-10-CM

## 2017-12-21 ENCOUNTER — Ambulatory Visit (INDEPENDENT_AMBULATORY_CARE_PROVIDER_SITE_OTHER): Payer: Medicare Other | Admitting: Cardiovascular Disease

## 2017-12-21 ENCOUNTER — Encounter: Payer: Self-pay | Admitting: Cardiovascular Disease

## 2017-12-21 VITALS — BP 135/76 | HR 56 | Ht 63.0 in | Wt 160.4 lb

## 2017-12-21 DIAGNOSIS — I472 Ventricular tachycardia: Secondary | ICD-10-CM | POA: Diagnosis not present

## 2017-12-21 DIAGNOSIS — Z7901 Long term (current) use of anticoagulants: Secondary | ICD-10-CM

## 2017-12-21 DIAGNOSIS — I10 Essential (primary) hypertension: Secondary | ICD-10-CM

## 2017-12-21 DIAGNOSIS — E1159 Type 2 diabetes mellitus with other circulatory complications: Secondary | ICD-10-CM | POA: Insufficient documentation

## 2017-12-21 DIAGNOSIS — E7800 Pure hypercholesterolemia, unspecified: Secondary | ICD-10-CM

## 2017-12-21 DIAGNOSIS — I4729 Other ventricular tachycardia: Secondary | ICD-10-CM

## 2017-12-21 DIAGNOSIS — Z95810 Presence of automatic (implantable) cardiac defibrillator: Secondary | ICD-10-CM

## 2017-12-21 DIAGNOSIS — I2510 Atherosclerotic heart disease of native coronary artery without angina pectoris: Secondary | ICD-10-CM

## 2017-12-21 DIAGNOSIS — E119 Type 2 diabetes mellitus without complications: Secondary | ICD-10-CM | POA: Diagnosis not present

## 2017-12-21 DIAGNOSIS — I480 Paroxysmal atrial fibrillation: Secondary | ICD-10-CM | POA: Diagnosis not present

## 2017-12-21 DIAGNOSIS — N183 Chronic kidney disease, stage 3 unspecified: Secondary | ICD-10-CM

## 2017-12-21 NOTE — Progress Notes (Signed)
. Patient ID: [name], female   DOB: November 26, 1931, 84 y.o.   MRN: [id number]    Cardiology Office Note    Date:  12/21/2017   ID:  [name], DOB 02-08-1931, MRN [id number]  PCP:  Chipper Herb, MD  Cardiologist:   Sanda Klein, MD   Chief Complaint  Patient presents with  . Follow-up    History of Present Illness:  [name] is a 84 y.o. 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  She continues to have intermittent episodes of weakness which she calls "atrial fibrillation".  We did indeed document atrial fibrillation with well-controlled ventricular response in the office in February of this year during 1 of her "spells".  This was again confirmed to an episode of symptomatic atrial fibrillation while wearing an event monitor a couple of months ago.  Her episodes of atrial fibrillation usually last for 12-24 hours and she has excellent ventricular rate control.  She has not had syncope, severe dyspnea or angina.  She has vague chest discomfort and just feels very weak.  She has not had problems with leg edema, intermittent claudication or new focal neurological deficits.  Her memory is declining very slowly..  She denies falls, bleeding problems and is compliant with Eliquis.  She has not had any focal neurological events.  She is no longer on iron supplements.  Interrogation of her defibrillator shows normal findings.  Single-chamber defibrillator shows  no episodes of high ventricular rates.  Estimated generator longevity is 12 years.  She has not had sustained or nonsustained ventricular arrhythmia.  She does not require ventricular pacing.   Her device is a Office manager device was implanted as a generator change in 2016.    Shee was on treatment with disopyramide for years for her history of catecholaminergic polymorphic VT, but this medication had to be gradually discontinued due to side effects, especially orthostatic hypotension and dry mouth.  In the past she developed torsades de pointes with sotalol and diarrhea with quinidine.  We have discussed the use of amiodarone to prevent atrial fibrillation episodes, but she did not want to take it because of the side effect profile.   Past Medical History:  Diagnosis Date  . AICD (automatic cardioverter/defibrillator) present   . Allergy    SESONAL  . Anxiety   . ARTHRITIS   . Arthritis   . Atrial fibrillation ([place])   . CAD (coronary artery disease)    a. history of cardiac arrest 1993;  b. s/p LAD/LCX stenting in 2003;  c. 07/2011 Cath: LM nl, LAD patent stent, D1 80ost, LCX patent stent, RCA min irregs;  d. 04/2012 MV: EF 66%, no ishcemia;  e. 12/2013 Echo: Ef 55%, no rwma, Gr 1 DD, triv AI/MR, mildly dil LA;  f. 12/2013 Lexi MV: intermediate risk - apical ischemia and inf/infsept fixed defect, ? artifactual.  . CAD in native artery 12/31/2013   Previous stents to LAD and Ramus patent on cath today 12/31/13 also There is severe disease in the ostial first diagonal which is unchanged from most recent  cardiac catheterization. The right coronary artery could not be engaged selectively but nonselective angiography showed no significant disease in the proximal and midsegment.     . Cataract    DENIES  . Chronic back pain   . Chronic sinus bradycardia   . CKD (chronic kidney disease), stage III ([place])   . Clotting disorder (HCC)    DVT  . Diabetes mellitus without complication ([place])      DENIES  . Diverticulosis of colon (without mention of hemorrhage) 2007   Colonoscopy   . Esophageal stricture    a. 2012 s/p dil.  . Esophagitis, unspecified    a. 2012 EGD  . EXTERNAL HEMORRHOIDS   . GERD (gastroesophageal reflux disease)    omeprazole  . H/O hiatal hernia   . Hiatal hernia    a. 2012 EGD.  Marland Kitchen History of DVT in the past, not on Coumadin now    left  leg  . HYPERCHOLESTEROLEMIA   . Hypertension   . ICD (implantable cardiac defibrillator) in place    a. s/p initial ICD in 1993 in setting of cardiac arrest;  b. 01/2007 gen change: Guidant T135 Vitality DS VR single lead ICD.  . Macular degeneration    gets injecton in eye every 5 weeks- last injection - 05/03/2013   . Myocardial infarction ([place]) 1993  . Nephrolithiasis, just saw Dr [name]   . Orthostatic hypotension   . Peripheral vascular disease ([place])    ???  . Unspecified gastritis and gastroduodenitis without mention of hemorrhage    a. 2003 EGD->not noted on 2012 EGD.    Past Surgical History:  Procedure Laterality Date  . BREAST BIOPSY Left 11/2013  . CARDIAC CATHETERIZATION    . CARDIAC CATHETERIZATION  01/01/15   patent stents -there is severe disease in ostial 1st diag but without change.   Marland Kitchen CARDIAC DEFIBRILLATOR PLACEMENT  02/05/2007   Guidant  . CARDIOVASCULAR STRESS TEST  12/30/2013   abnormal  . COLONOSCOPY    . coronary stents     . CYSTOSCOPY WITH URETEROSCOPY Right 05/20/2013   Procedure: CYSTOSCOPY, RIGHT URETEROSCOPY STONE EXTRACTION, Insertion of right DOUBLE J STENT ;  Surgeon: Irine Seal, MD;  Location: [facility];  Service: Urology;  Laterality: Right;  . CYSTOSCOPY WITH URETEROSCOPY AND STENT PLACEMENT N/A 06/03/2013   Procedure: SECOND LOOK CYSTOSCOPY WITH URETEROSCOPY  HOLMIUM LASER LITHO AND STONE EXTRACTION Sammie Bench ;  Surgeon: Malka So, MD;  Location: [facility];  Service: Urology;  Laterality: N/A;  . GIVENS CAPSULE STUDY N/A 10/30/2016   Procedure: GIVENS CAPSULE STUDY;   Surgeon: Irene Shipper, MD;  Location: [facility];  Service: Endoscopy;  Laterality: N/A;  NEEDS TO BE ADMITTED FOR OBSERVATION PT. HAS DEFIB  . HEMORROIDECTOMY  80's  . HOLMIUM LASER APPLICATION Right 03/24/1827   Procedure: HOLMIUM LASER APPLICATION;  Surgeon: Irine Seal, MD;  Location: [facility];  Service: Urology;  Laterality: Right;  . HYSTEROSCOPY W/D&C  09/02/2010   Procedure: DILATATION AND CURETTAGE (D&C) /HYSTEROSCOPY;  Surgeon: Margarette Asal;  Location: [facility];  Service: Gynecology;  Laterality: N/A;  Dilation and Curettage with Hysteroscopy and Polypectomy  . IMPLANTABLE CARDIOVERTER DEFIBRILLATOR (ICD) GENERATOR CHANGE N/A 02/10/2014   Procedure: ICD GENERATOR CHANGE;  Surgeon: Sanda Klein, MD;  Location: [facility];  Service: Cardiovascular;  Laterality: N/A;  . LEFT HEART CATHETERIZATION WITH CORONARY ANGIOGRAM N/A 07/28/2011   Procedure: LEFT HEART CATHETERIZATION WITH CORONARY ANGIOGRAM;  Surgeon: Lorretta Harp, MD;  Location: [facility]  CATH LAB;  Service: Cardiovascular;  Laterality: N/A;  . LEFT HEART CATHETERIZATION WITH CORONARY ANGIOGRAM N/A 12/31/2013   Procedure: LEFT HEART CATHETERIZATION WITH CORONARY ANGIOGRAM;  Surgeon: Wellington Hampshire, MD;  Location: [facility] CATH [facility];  Service: Cardiovascular;  Laterality: N/A;    Current Medications: Outpatient Medications Prior to Visit  Medication Sig Dispense Refill  . amLODipine (NORVASC) 2.5 MG tablet Take 1 tablet (2.5 mg total) by mouth daily. 90 tablet 3  . Calcium Citrate-Vitamin D (CITRACAL + D PO) Take 1 tablet by mouth daily.    . Cholecalciferol (VITAMIN D) 1000 UNITS capsule Take 2,000 Units by mouth daily.     Marland Kitchen ELIQUIS 5 MG TABS tablet Take 1 tablet (5 mg total) by mouth 2 (two) times daily. 180 tablet 1  . lisinopril (PRINIVIL,ZESTRIL) 20 MG tablet Take 1 tablet (20 mg total) by mouth 2 (two) times daily. 180 tablet 3  . methocarbamol (ROBAXIN) 500 MG tablet Take 1 tablet (500 mg total) by mouth 2 (two) times daily. 20  tablet 0  . metoprolol tartrate (LOPRESSOR) 50 MG tablet Take 1 tablet (50 mg total) by mouth 2 (two) times daily. 180 tablet 3  . Multiple Vitamins-Minerals (CENTRUM SILVER ULTRA WOMENS PO) Take 1 tablet by mouth daily.    . nitroGLYCERIN (NITROSTAT) 0.4 MG SL tablet Place 1 tablet (0.4 mg total) under the tongue every 5 (five) minutes as needed for chest pain. 25 tablet 4  . pantoprazole (PROTONIX) 40 MG tablet Take 1 tablet (40 mg total) by mouth 2 (two) times daily. 180 tablet 3  . PARoxetine (PAXIL) 20 MG tablet TAKE (1) TABLET DAILY IN THE MORNING. 90 tablet 0  . rosuvastatin (CRESTOR) 40 MG tablet TAKE 1 TABLET DAILY 90 tablet 0  . torsemide (DEMADEX) 20 MG tablet Take 20 mg by mouth daily as needed (edema).    . ciprofloxacin (CIPRO) 500 MG tablet Take 1 tablet (500 mg total) by mouth 2 (two) times daily. 10 tablet 0  . ferrous sulfate 325 (65 FE) MG tablet Take 1 tablet (325 mg total) by mouth 2 (two) times daily with a meal. 180 tablet 3  . gabapentin (NEURONTIN) 100 MG capsule TAKE 1 CAPSULE AT BEDTIME 90 capsule 0  . triamcinolone (KENALOG) 0.025 % ointment Apply 1 application topically 2 (two) times daily. On lower back rash 80 g 0   No facility-administered medications prior to visit.      Allergies:   Sotalol; Bactrim [sulfamethoxazole-trimethoprim]; Actos [pioglitazone]; Adhesive [tape]; Contrast media [iodinated diagnostic agents]; Latex; and Lipitor [atorvastatin]   Social History   Socioeconomic History  . Marital status: Widowed    Spouse name: Not on file  . Number of children: Not on file  . Years of education: Not on file  . Highest education level: Not on file  Occupational History  . Not on file  Social Needs  . Financial resource strain: Not on file  . Food insecurity:    Worry: Not on file    Inability: Not on file  . Transportation needs:    Medical: Not on file    Non-medical: Not on file  Tobacco Use  . Smoking status: Never Smoker  . Smokeless  tobacco: Never Used  Substance and Sexual Activity  . Alcohol use: No  . Drug use: No  . Sexual activity: Never    Birth control/protection: Post-menopausal  Lifestyle  . Physical activity:    Days per week: Not on file    Minutes per session:  Not on file  . Stress: Not on file  Relationships  . Social connections:    Talks on phone: Not on file    Gets together: Not on file    Attends religious service: Not on file    Active member of club or organization: Not on file    Attends meetings of clubs or organizations: Not on file    Relationship status: Not on file  Other Topics Concern  . Not on file  Social History Narrative  . Not on file     Family History:  The patient's family history includes Asthma in her brother; Atrial fibrillation in her sister; Cancer in her daughter; Colon polyps in her daughter; Diabetes in her daughter; Heart attack in her brother and father; Heart disease in her brother; Hyperlipidemia in her sister; Macular degeneration in her sister; Osteoporosis in her sister; Other in her father and mother; Stroke in her mother, sister, and sister; Uterine cancer in her sister.   ROS:   Please see the history of present illness.    ROS all other systems are reviewed and are negative  PHYSICAL EXAM:   VS:  BP 135/76   Pulse (!) 56   Ht 5\' 3"  (1.6 m)   Wt 160 lb 6.4 oz (72.8 kg)   BMI 28.41 kg/m       General: Alert, oriented x3, no distress, well-healed left subclavian defibrillator site Head: no evidence of trauma, PERRL, EOMI, no exophtalmos or lid lag, no myxedema, no xanthelasma; normal ears, nose and oropharynx Neck: normal jugular venous pulsations and no hepatojugular reflux; brisk carotid pulses without delay and no carotid bruits Chest: clear to auscultation, no signs of consolidation by percussion or palpation, normal fremitus, symmetrical and full respiratory excursions Cardiovascular: normal position and quality of the apical impulse, regular  rhythm, normal first and second heart sounds, no murmurs, rubs or gallops Abdomen: no tenderness or distention, no masses by palpation, no abnormal pulsatility or arterial bruits, normal bowel sounds, no hepatosplenomegaly Extremities: no clubbing, cyanosis or edema; 2+ radial, ulnar and brachial pulses bilaterally; 2+ right femoral, posterior tibial and dorsalis pedis pulses; 2+ left femoral, posterior tibial and dorsalis pedis pulses; no subclavian or femoral bruits Neurological: grossly nonfocal Psych: Normal mood and affect    Wt Readings from Last 3 Encounters:  12/21/17 160 lb 6.4 oz (72.8 kg)  09/19/17 160 lb (72.6 kg)  08/23/17 165 lb (74.8 kg)    Studies/Labs Reviewed:   EKG:  EKG is not ordered today.  Intracardiac electrogram shows sinus rhythm with a single PVC, left bundle branch block with left axis deviation at -50 degrees,  QRS 124 ms, QTC 440 ms Recent Labs: 08/15/2017: ALT 17; Hemoglobin 14.2; Platelets 280; TSH 5.810 08/23/2017: BUN 18; Creatinine, Ser 1.23; Potassium 4.8; Sodium 138   Lipid Panel    Component Value Date/Time   CHOL 165 08/15/2017 1115   CHOL 140 07/31/2012 1019   TRIG 134 08/15/2017 1115   TRIG 115 06/07/2015 1111   TRIG 161 (H) 07/31/2012 1019   HDL 65 08/15/2017 1115   HDL 68 06/07/2015 1111   HDL 43 07/31/2012 1019   CHOLHDL 2.5 08/15/2017 1115   CHOLHDL 2.8 09/02/2010 0629   VLDL 34 09/02/2010 0629   LDLCALC 73 08/15/2017 1115   LDLCALC 65 09/09/2013 0841   LDLCALC 65 07/31/2012 1019     ASSESSMENT:    1. Paroxysmal atrial fibrillation (HCC)   2. Long term current use of anticoagulant  3. Catecholaminergic polymorphic ventricular tachycardia ([place])   4. Coronary artery disease involving native coronary artery of native heart without angina pectoris   5. Essential hypertension   6. Diet-controlled diabetes mellitus ([place])   7. CKD (chronic kidney disease) stage 3, GFR 30-59 ml/min (HCC)   8. ICD (implantable  cardioverter-defibrillator) in place   9. Hypercholesterolemia      PLAN:  In order of problems listed above:  1. AFib: This is responsible for her "weak spells", although she has excellent rate control.  The correlation between her symptoms and the episodes of atrial fibrillation has been confirmed by event monitor tracings.  She is fully anticoagulated. CHADSVasc 6 (age 26, gender, DM, CAD, hypertension).  There is a very short list of appropriate antiarrhythmics for her, due to her other cardiac problems.  I would states limited to amiodarone and Multaq and she has declined to take either. 2. Eliquis: No history of TIA or stroke or other systemic embolism.  Microcytic/iron deficiency anemia has resolved.  She is no longer receiving iron supplements.  Should have a CBC checked periodically. 3. VT: No new events. she has not had any defibrillator therapy or any recurrence of VT since her defibrillator implantation in 1993, despite the fact that we stopped disopyramide therapy a couple of years ago 4. CAD: Asymptomatic.  She has normal left ventricular systolic function.  She does not have angina pectoris and has not required revascularization since 2003.  Her last assessment was a cardiac catheterization 2015 that did not show any significant coronary stenoses. 5. HTN/Orthostatic hypotension: No complaints related to this today.  Her current blood pressure is in acceptable range. 6. DM: Diet controlled 7. CKD: Her creatinine has returned to her baseline of approximately 1.2 (GFR around 45).  She must have been "dry" when she had her labs drawn in July. 8. ICD: Normal single-chamber device function.  She prefers an office visit in 3 months, rather than a remote download. 9. HLP: Taking statin and very close to target LDL of 70 or less.  Medication Adjustments/Labs and Tests Ordered: Current medicines are reviewed at length with the patient today.  Concerns regarding medicines are outlined above.   Medication changes, Labs and Tests ordered today are listed in the Patient Instructions below. Patient Instructions  Medication Instructions:  Dr Sallyanne Kuster recommends that you continue on your current medications as directed. Please refer to the Current Medication list given to you today.  If you need a refill on your cardiac medications before your next appointment, please call your pharmacy.   Follow-Up: At [facility], you and your health needs are our priority.  As part of our continuing mission to provide you with exceptional heart care, we have created designated Provider Care Teams.  These Care Teams include your primary Cardiologist (physician) and Advanced Practice Providers (APPs -  Physician Assistants and Nurse Practitioners) who all work together to provide you with the care you need, when you need it. You will need a follow up appointment in 3 months with a pacemaker check. You may see Sanda Klein, MD or one of the following Advanced Practice Providers on your designated Care Team: [place], [place] . Fabian Sharp, PA-C    Signed, Sanda Klein, MD  12/21/2017 3:21 PM    [facility] [address], [place], [place]  [address] Phone: [phone]; Fax: [phone]

## 2017-12-21 NOTE — Patient Instructions (Signed)
Medication Instructions:  Dr Sallyanne Kuster recommends that you continue on your current medications as directed. Please refer to the Current Medication list given to you today.  If you need a refill on your cardiac medications before your next appointment, please call your pharmacy.   Follow-Up: At [facility], you and your health needs are our priority.  As part of our continuing mission to provide you with exceptional heart care, we have created designated Provider Care Teams.  These Care Teams include your primary Cardiologist (physician) and Advanced Practice Providers (APPs -  Physician Assistants and Nurse Practitioners) who all work together to provide you with the care you need, when you need it. You will need a follow up appointment in 3 months with a pacemaker check. You may see Sanda Klein, MD or one of the following Advanced Practice Providers on your designated Care Team: [place], [place] . Fabian Sharp, PA-C

## 2017-12-31 ENCOUNTER — Ambulatory Visit (INDEPENDENT_AMBULATORY_CARE_PROVIDER_SITE_OTHER): Payer: Medicare Other | Admitting: Family Medicine

## 2017-12-31 ENCOUNTER — Ambulatory Visit (INDEPENDENT_AMBULATORY_CARE_PROVIDER_SITE_OTHER): Payer: Medicare Other

## 2017-12-31 ENCOUNTER — Encounter: Payer: Self-pay | Admitting: Family Medicine

## 2017-12-31 VITALS — BP 112/67 | HR 58 | Temp 96.6°F | Ht 63.0 in | Wt 160.0 lb

## 2017-12-31 DIAGNOSIS — I5032 Chronic diastolic (congestive) heart failure: Secondary | ICD-10-CM

## 2017-12-31 DIAGNOSIS — I480 Paroxysmal atrial fibrillation: Secondary | ICD-10-CM | POA: Diagnosis not present

## 2017-12-31 DIAGNOSIS — R5383 Other fatigue: Secondary | ICD-10-CM

## 2017-12-31 DIAGNOSIS — N183 Chronic kidney disease, stage 3 unspecified: Secondary | ICD-10-CM

## 2017-12-31 DIAGNOSIS — I2510 Atherosclerotic heart disease of native coronary artery without angina pectoris: Secondary | ICD-10-CM

## 2017-12-31 DIAGNOSIS — G63 Polyneuropathy in diseases classified elsewhere: Secondary | ICD-10-CM

## 2017-12-31 DIAGNOSIS — R0789 Other chest pain: Secondary | ICD-10-CM | POA: Diagnosis not present

## 2017-12-31 DIAGNOSIS — E1140 Type 2 diabetes mellitus with diabetic neuropathy, unspecified: Secondary | ICD-10-CM

## 2017-12-31 DIAGNOSIS — E889 Metabolic disorder, unspecified: Secondary | ICD-10-CM | POA: Diagnosis not present

## 2017-12-31 DIAGNOSIS — I10 Essential (primary) hypertension: Secondary | ICD-10-CM

## 2017-12-31 DIAGNOSIS — E7800 Pure hypercholesterolemia, unspecified: Secondary | ICD-10-CM | POA: Diagnosis not present

## 2017-12-31 DIAGNOSIS — E559 Vitamin D deficiency, unspecified: Secondary | ICD-10-CM | POA: Diagnosis not present

## 2017-12-31 LAB — BAYER DCA HB A1C WAIVED: HB A1C (BAYER DCA - WAIVED): 7.2 % — ABNORMAL HIGH (ref <7.0)

## 2017-12-31 NOTE — Progress Notes (Signed)
Subjective:    Patient ID: [name], female    DOB: 1931/10/06, 84 y.o.   MRN: [id number]  HPI  Pt here for follow up and management of chronic medical problems which includes a fib, hypertension and diabetes. She is taking medication regularly.  The patient continues to have weakness fatigue and dizziness along with nausea and some loose bowel movements.  The recent note from her cardiologist was reviewed and feels like that a lot of these weak spells cannot definitely be attributed to bouts of atrial fibrillation.  We will get a chest x-ray today have her return in FOBT and get lab work and specifically the check a CBC as requested by the cardiologist.  She is controlling her sugar with diet.  She has plans to get a mammogram on December 17.  She has paroxysmal atrial fibrillation hyperlipidemia hypertension and chronic kidney disease.  He also has GERD issues and diabetes controlled with diet.  The patient just feels bad and she does associate a lot of the feeling bad with the atrial fibrillation.  She continues to have loose bowel movements and has seen the gastroenterologist for this.  She does take an off brand medicine for loose bowel movements periodically and I suggested that she try the brand name like Imodium instead and she plans to do that.  She has shortness of breath with the atrial fibrillation tells and the spells may last as long as 2 days at a time but then she is weak for a while after that.  She denies any blood in the stool.  She denies any black tarry bowel movements.  She is passing her water well even though she has had some urinary tract infections in the past.  She does not check blood sugars at home because all of her blood sugars have been good.  We did decide that we would try to get some blood sugars checked especially when she has weak spells just to make sure that the blood sugar is good during that time.     Patient Active Problem List   Diagnosis Date Noted  .  Essential hypertension 12/21/2017  . Diet-controlled diabetes mellitus ([place]) 12/21/2017  . History of drug-induced prolonged QT interval with torsade de pointes 11/01/2016  . Iron deficiency anemia 10/30/2016  . Long term current use of anticoagulant 02/17/2016  . Near syncope 05/22/2015  . Diabetes mellitus type 2, diet-controlled ([place]) 09/14/2014  . ICD (implantable cardioverter-defibrillator) battery depletion 01/20/2014  . Abnormal nuclear stress test, 12/30/13 12/31/2013  . Coronary artery disease involving native coronary artery of native heart without angina pectoris 12/31/2013  . Unstable angina, neg MI, cath stable.maybe GI 12/30/2013  . Paroxysmal atrial fibrillation, chads2 Vasc2 score of 5, on eliquis 10/16/2013  . Catecholaminergic polymorphic ventricular tachycardia ([place]) 10/16/2013  . Ureteral stone with hydronephrosis 05/20/2013  . Metabolic syndrome 11/18/1592  . Hypotension, meds stopped this admission 07/29/2011  . Orthostatic hypotension 07/27/2011  . Chest pain, r/o cardiac, negative MI 07/25/2011  . Chronic sinus bradycardia 07/25/2011  . ICD (implantable cardioverter-defibrillator) in place 07/25/2011  . CKD (chronic kidney disease) stage 3, GFR 30-59 ml/min (HCC) 07/25/2011    Class: Chronic  . History of DVT in the past, on eliquis now 07/25/2011    Class: History of  . Nephrolithiasis, just saw Dr [name] 07/25/2011    Class: History of  . DYSPHAGIA 03/24/2010  . Chronic ischemic heart disease 12/03/2007  . RECTAL BLEEDING 12/03/2007  . Irritable bowel  syndrome with diarrhea 12/03/2007  . EPIGASTRIC PAIN 12/03/2007  . Hypercholesterolemia 12/02/2007    Class: History of  . EXTERNAL HEMORRHOIDS 12/02/2007    Class: History of  . ESOPHAGITIS 12/02/2007  . GASTRITIS 12/02/2007  . COLITIS 12/02/2007  . DIVERTICULOSIS, COLON 12/02/2007  . ARTHRITIS 12/02/2007    Class: Chronic   Outpatient Encounter Medications as of 12/31/2017  Medication Sig  .  amLODipine (NORVASC) 2.5 MG tablet Take 1 tablet (2.5 mg total) by mouth daily.  . Calcium Citrate-Vitamin D (CITRACAL + D PO) Take 1 tablet by mouth daily.  . Cholecalciferol (VITAMIN D) 1000 UNITS capsule Take 2,000 Units by mouth daily.   Marland Kitchen ELIQUIS 5 MG TABS tablet Take 1 tablet (5 mg total) by mouth 2 (two) times daily.  Marland Kitchen lisinopril (PRINIVIL,ZESTRIL) 20 MG tablet Take 1 tablet (20 mg total) by mouth 2 (two) times daily.  . methocarbamol (ROBAXIN) 500 MG tablet Take 1 tablet (500 mg total) by mouth 2 (two) times daily.  . metoprolol tartrate (LOPRESSOR) 50 MG tablet Take 1 tablet (50 mg total) by mouth 2 (two) times daily.  . Multiple Vitamins-Minerals (CENTRUM SILVER ULTRA WOMENS PO) Take 1 tablet by mouth daily.  . nitroGLYCERIN (NITROSTAT) 0.4 MG SL tablet Place 1 tablet (0.4 mg total) under the tongue every 5 (five) minutes as needed for chest pain.  . pantoprazole (PROTONIX) 40 MG tablet Take 1 tablet (40 mg total) by mouth 2 (two) times daily.  Marland Kitchen PARoxetine (PAXIL) 20 MG tablet TAKE (1) TABLET DAILY IN THE MORNING.  . rosuvastatin (CRESTOR) 40 MG tablet TAKE 1 TABLET DAILY  . torsemide (DEMADEX) 20 MG tablet Take 20 mg by mouth daily as needed (edema).   No facility-administered encounter medications on file as of 12/31/2017.      Review of Systems  Constitutional: Positive for fatigue.  HENT: Negative.   Eyes: Negative.   Respiratory: Negative.   Cardiovascular: Negative.   Gastrointestinal: Positive for diarrhea (several times a day and at night ) and nausea (at times ).  Endocrine: Negative.   Genitourinary: Negative.   Musculoskeletal: Negative.   Skin: Negative.   Allergic/Immunologic: Negative.   Neurological: Positive for weakness. Negative for dizziness.  Hematological: Negative.   Psychiatric/Behavioral: Negative.        Objective:   Physical Exam Vitals signs and nursing note reviewed.  Constitutional:      Appearance: Normal appearance. She is  well-developed. She is obese.     Comments: The patient is alert but fatigued.  HENT:     Head: Normocephalic and atraumatic.     Right Ear: Tympanic membrane, ear canal and external ear normal. There is no impacted cerumen.     Left Ear: Tympanic membrane, ear canal and external ear normal. There is no impacted cerumen.     Nose: Nose normal.     Mouth/Throat:     Mouth: Mucous membranes are moist.     Pharynx: Oropharynx is clear.  Eyes:     General: No scleral icterus.       Right eye: No discharge.        Left eye: No discharge.     Extraocular Movements: Extraocular movements intact.     Conjunctiva/sclera: Conjunctivae normal.     Pupils: Pupils are equal, round, and reactive to light.  Neck:     Musculoskeletal: Normal range of motion and neck supple.     Thyroid: No thyromegaly.     Vascular: No carotid bruit or JVD.  Comments: No bruits thyromegaly or anterior cervical adenopathy Cardiovascular:     Rate and Rhythm: Normal rate and regular rhythm.     Pulses: Normal pulses.     Heart sounds: Normal heart sounds. No murmur.     Comments: The heart is regular today at 72/min with good pedal pulses palpable. Pulmonary:     Effort: Pulmonary effort is normal.     Breath sounds: Normal breath sounds. No wheezing or rales.     Comments: The lungs are clear anteriorly and posteriorly Abdominal:     General: Abdomen is flat. Bowel sounds are normal.     Palpations: Abdomen is soft. There is no mass.     Tenderness: There is abdominal tenderness.     Comments: Generalized tenderness without masses or organ enlargement  Musculoskeletal:        General: No tenderness.     Right lower leg: No edema.     Left lower leg: No edema.     Comments: Patient is somewhat unsteady on her feet and she does use a cane for ambulation.  Tender bilateral joint lines of right knee  Lymphadenopathy:     Cervical: No cervical adenopathy.  Skin:    General: Skin is warm and dry.      Findings: No rash.  Neurological:     Mental Status: She is alert and oriented to person, place, and time. Mental status is at baseline.     Cranial Nerves: No cranial nerve deficit.     Deep Tendon Reflexes: Reflexes are normal and symmetric.     Comments: Diminished sensation to both feet   Psychiatric:        Mood and Affect: Mood normal.        Behavior: Behavior normal.        Thought Content: Thought content normal.        Judgment: Judgment normal.     Comments: Mood affect and behavior are normal for this patient     BP 112/67 (BP Location: Left Arm)   Pulse (!) 58   Temp (!) 96.6 F (35.9 C) (Oral)   Ht 5' 3"  (1.6 m)   Wt 160 lb (72.6 kg)   BMI 28.34 kg/m        Assessment & Plan:  1. Hyperlipidemia -Continue current treatment with Crestor pending results of lab work - CBC with Differential/Platelet - Lipid panel - DG Chest 2 View; Future  2. Essential hypertension -The blood pressure is good today and she will continue with current treatment - BMP8+EGFR - CBC with Differential/Platelet - Hepatic function panel - DG Chest 2 View; Future  3. Vitamin D deficiency -Continue with vitamin D replacement pending results of lab work - CBC with Differential/Platelet - VITAMIN D 25 Hydroxy (Vit-D Deficiency, Fractures)  4. Type 2 diabetes mellitus with diabetic neuropathy, without long-term current use of insulin ([place]) -Continue with diet and therapeutic lifestyle changes pending results of lab work - CBC with Differential/Platelet - [place] Hb A1c Waived  5. Paroxysmal atrial fibrillation, chads2 Vasc2 score of 5, on eliquis -Continue follow-up with Dr. Sallyanne Kuster - CBC with Differential/Platelet - DG Chest 2 View; Future  6. CKD (chronic kidney disease) stage 3, GFR 30-59 ml/min (HCC) -Avoid NSAIDs and only take Tylenol if needed for aches pains and fever - CBC with Differential/Platelet  7. Other fatigue - Thyroid Panel With TSH  8. Chronic diastolic  heart failure ([place]) -Follow-up with cardiology as planned  9. Peripheral neuropathy due  to disorder of metabolism ([place]) -Check blood sugars periodically, wear good shoes check feet regularly since sensation is somewhat diminished  Patient Instructions                       Medicare Annual Wellness Visit  [facility] and the medical providers at [facility] strive to bring you the best medical care.  In doing so we not only want to address your current medical conditions and concerns but also to detect new conditions early and prevent illness, disease and health-related problems.    Medicare offers a yearly Wellness Visit which allows our clinical staff to assess your need for preventative services including immunizations, lifestyle education, counseling to decrease risk of preventable diseases and screening for fall risk and other medical concerns.    This visit is provided free of charge (no copay) for all Medicare recipients. The clinical pharmacists at [facility] have begun to conduct these Wellness Visits which will also include a thorough review of all your medications.    As you primary medical provider recommend that you make an appointment for your Annual Wellness Visit if you have not done so already this year.  You may set up this appointment before you leave today or you may call back ([phone]) and schedule an appointment.  Please make sure when you call that you mention that you are scheduling your Annual Wellness Visit with the clinical pharmacist so that the appointment may be made for the proper length of time.     Continue current medications. Continue good therapeutic lifestyle changes which include good diet and exercise. Fall precautions discussed with patient. If an FOBT was given today- please return it to our front desk. If you are over 21 years old - you may need Prevnar 49 or the adult Pneumonia vaccine.  **Flu shots are  available--- please call and schedule a FLU-CLINIC appointment**  After your visit with [place] today you will receive a survey in the mail or online from [employer] regarding your care with [place]. Please take a moment to fill this out. Your feedback is very important to [place] as you can help [place] better understand your patient needs as well as improve your experience and satisfaction. WE CARE ABOUT YOU!!!   Continue to be careful and do not put yourself at risk for falling Use cane for stability Continue to drink plenty of water and stay well-hydrated Try using the brand name Imodium instead of the generic brand from [place] some blood sugars periodically especially when you are having these spells and make sure your blood sugar is doing okay during that time Follow-up with cardiology as planned Follow-up with gastroenterology as needed Follow-up with orthopedist as needed and have the orthopedist discuss any need for surgery with the cardiologist directly  Arrie Senate MD

## 2017-12-31 NOTE — Patient Instructions (Addendum)
Medicare Annual Wellness Visit  [facility] and the medical providers at [facility] strive to bring you the best medical care.  In doing so we not only want to address your current medical conditions and concerns but also to detect new conditions early and prevent illness, disease and health-related problems.    Medicare offers a yearly Wellness Visit which allows our clinical staff to assess your need for preventative services including immunizations, lifestyle education, counseling to decrease risk of preventable diseases and screening for fall risk and other medical concerns.    This visit is provided free of charge (no copay) for all Medicare recipients. The clinical pharmacists at [facility] have begun to conduct these Wellness Visits which will also include a thorough review of all your medications.    As you primary medical provider recommend that you make an appointment for your Annual Wellness Visit if you have not done so already this year.  You may set up this appointment before you leave today or you may call back ([phone]) and schedule an appointment.  Please make sure when you call that you mention that you are scheduling your Annual Wellness Visit with the clinical pharmacist so that the appointment may be made for the proper length of time.     Continue current medications. Continue good therapeutic lifestyle changes which include good diet and exercise. Fall precautions discussed with patient. If an FOBT was given today- please return it to our front desk. If you are over 29 years old - you may need Prevnar 3 or the adult Pneumonia vaccine.  **Flu shots are available--- please call and schedule a FLU-CLINIC appointment**  After your visit with [place] today you will receive a survey in the mail or online from [employer] regarding your care with [place]. Please take a moment to fill this out. Your feedback is very  important to [place] as you can help [place] better understand your patient needs as well as improve your experience and satisfaction. WE CARE ABOUT YOU!!!   Continue to be careful and do not put yourself at risk for falling Use cane for stability Continue to drink plenty of water and stay well-hydrated Try using the brand name Imodium instead of the generic brand from [place] some blood sugars periodically especially when you are having these spells and make sure your blood sugar is doing okay during that time Follow-up with cardiology as planned Follow-up with gastroenterology as needed Follow-up with orthopedist as needed and have the orthopedist discuss any need for surgery with the cardiologist directly

## 2018-01-01 LAB — CBC WITH DIFFERENTIAL/PLATELET
BASOS ABS: 0.1 10*3/uL (ref 0.0–0.2)
Basos: 1 %
EOS (ABSOLUTE): 0.5 10*3/uL — ABNORMAL HIGH (ref 0.0–0.4)
Eos: 7 %
Hematocrit: 38.8 % (ref 34.0–46.6)
Hemoglobin: 12.8 g/dL (ref 11.1–15.9)
IMMATURE GRANS (ABS): 0 10*3/uL (ref 0.0–0.1)
Immature Granulocytes: 1 %
LYMPHS: 25 %
Lymphocytes Absolute: 1.5 10*3/uL (ref 0.7–3.1)
MCH: 31.7 pg (ref 26.6–33.0)
MCHC: 33 g/dL (ref 31.5–35.7)
MCV: 96 fL (ref 79–97)
MONOCYTES: 11 %
Monocytes Absolute: 0.7 10*3/uL (ref 0.1–0.9)
NEUTROS ABS: 3.4 10*3/uL (ref 1.4–7.0)
NRBC: 1 % — AB (ref 0–0)
Neutrophils: 55 %
PLATELETS: 283 10*3/uL (ref 150–450)
RBC: 4.04 x10E6/uL (ref 3.77–5.28)
RDW: 12.9 % (ref 12.3–15.4)
WBC: 6.2 10*3/uL (ref 3.4–10.8)

## 2018-01-01 LAB — THYROID PANEL WITH TSH
Free Thyroxine Index: 1.9 (ref 1.2–4.9)
T3 Uptake Ratio: 28 % (ref 24–39)
T4 TOTAL: 6.9 ug/dL (ref 4.5–12.0)
TSH: 3.3 u[IU]/mL (ref 0.450–4.500)

## 2018-01-01 LAB — BMP8+EGFR
BUN/Creatinine Ratio: 18 (ref 12–28)
BUN: 25 mg/dL (ref 8–27)
CALCIUM: 9.3 mg/dL (ref 8.7–10.3)
CO2: 18 mmol/L — ABNORMAL LOW (ref 20–29)
CREATININE: 1.37 mg/dL — AB (ref 0.57–1.00)
Chloride: 107 mmol/L — ABNORMAL HIGH (ref 96–106)
GFR, EST AFRICAN AMERICAN: 41 mL/min/{1.73_m2} — AB (ref >59)
GFR, EST NON AFRICAN AMERICAN: 35 mL/min/{1.73_m2} — AB (ref >59)
Glucose: 111 mg/dL — ABNORMAL HIGH (ref 65–99)
Potassium: 4.9 mmol/L (ref 3.5–5.2)
Sodium: 138 mmol/L (ref 134–144)

## 2018-01-01 LAB — HEPATIC FUNCTION PANEL
ALT: 16 IU/L (ref 0–32)
AST: 20 IU/L (ref 0–40)
Albumin: 4.1 g/dL (ref 3.5–4.7)
Alkaline Phosphatase: 80 IU/L (ref 39–117)
Bilirubin Total: 0.5 mg/dL (ref 0.0–1.2)
Bilirubin, Direct: 0.16 mg/dL (ref 0.00–0.40)
Total Protein: 6.3 g/dL (ref 6.0–8.5)

## 2018-01-01 LAB — LIPID PANEL
Chol/HDL Ratio: 2.7 ratio (ref 0.0–4.4)
Cholesterol, Total: 141 mg/dL (ref 100–199)
HDL: 52 mg/dL (ref >39)
LDL Calculated: 55 mg/dL (ref 0–99)
Triglycerides: 170 mg/dL — ABNORMAL HIGH (ref 0–149)
VLDL CHOLESTEROL CAL: 34 mg/dL (ref 5–40)

## 2018-01-01 LAB — VITAMIN D 25 HYDROXY (VIT D DEFICIENCY, FRACTURES): Vit D, 25-Hydroxy: 17.2 ng/mL — ABNORMAL LOW (ref 30.0–100.0)

## 2018-01-10 ENCOUNTER — Ambulatory Visit
Admission: RE | Admit: 2018-01-10 | Discharge: 2018-01-10 | Disposition: A | Discharge status: Home / Self Care | Payer: Medicare Other | Source: Ambulatory Visit | Attending: Family Medicine | Admitting: Family Medicine

## 2018-01-10 ENCOUNTER — Other Ambulatory Visit: Payer: Self-pay | Admitting: Cardiovascular Disease

## 2018-01-10 DIAGNOSIS — Z1231 Encounter for screening mammogram for malignant neoplasm of breast: Secondary | ICD-10-CM

## 2018-01-17 ENCOUNTER — Other Ambulatory Visit: Payer: Self-pay | Admitting: Cardiovascular Disease

## 2018-01-18 ENCOUNTER — Telehealth: Payer: Self-pay | Admitting: *Deleted

## 2018-01-18 MED ORDER — ROSUVASTATIN CALCIUM 40 MG PO TABS: 20.0000 mg | ORAL_TABLET | Freq: Every day | ORAL | 0 refills | Status: DC

## 2018-01-18 NOTE — Telephone Encounter (Signed)
Clarification of Rx resent to pharmacy

## 2018-01-23 ENCOUNTER — Ambulatory Visit (INDEPENDENT_AMBULATORY_CARE_PROVIDER_SITE_OTHER): Payer: Medicare Other

## 2018-01-23 DIAGNOSIS — I429 Cardiomyopathy, unspecified: Secondary | ICD-10-CM

## 2018-01-24 LAB — CUP PACEART REMOTE DEVICE CHECK
Date Time Interrogation Session: 20200109194703
Implantable Lead Location: 753860
Implantable Lead Model: 157
Implantable Lead Serial Number: 138237
Implantable Pulse Generator Implant Date: 20160126
MDC IDC LEAD IMPLANT DT: 20090120
Pulse Gen Serial Number: 191956

## 2018-01-24 NOTE — Progress Notes (Signed)
Remote ICD transmission.   

## 2018-01-29 ENCOUNTER — Telehealth: Payer: Self-pay | Admitting: Family Medicine

## 2018-01-29 NOTE — Telephone Encounter (Signed)
Running to bathroom a lot, pain in stomach and rectum Some blood noticed.   Cant eat hardly anything Diarrhea after eating Cold all the time  This started 2 weeks ago.  appt made for pt

## 2018-01-30 ENCOUNTER — Ambulatory Visit (INDEPENDENT_AMBULATORY_CARE_PROVIDER_SITE_OTHER): Payer: Medicare Other | Admitting: Pediatrics

## 2018-01-30 ENCOUNTER — Encounter: Payer: Self-pay | Admitting: Pediatrics

## 2018-01-30 VITALS — BP 116/70 | HR 49 | Temp 96.6°F | Ht 63.0 in | Wt 152.0 lb

## 2018-01-30 DIAGNOSIS — R195 Other fecal abnormalities: Secondary | ICD-10-CM

## 2018-01-30 DIAGNOSIS — R1013 Epigastric pain: Secondary | ICD-10-CM

## 2018-01-30 DIAGNOSIS — R001 Bradycardia, unspecified: Secondary | ICD-10-CM | POA: Diagnosis not present

## 2018-01-30 DIAGNOSIS — D509 Iron deficiency anemia, unspecified: Secondary | ICD-10-CM | POA: Diagnosis not present

## 2018-01-30 NOTE — Progress Notes (Signed)
  Subjective:   Patient ID: [name], female    DOB: 1931-04-03, 85 y.o.   MRN: [id number] CC: Abdominal Pain (3 weeks) and Blood In Stools (Stools are dark)  HPI: [name] is a 85 y.o. female   Last colonoscopy 02/2010 On eliquis for DVT. Also with h/o PAF.   Has had dark stools, sometimes black. Ongoing past 3 weeks. Past few days slightly lighter brown. Has seen red looking like blood when she wipes sometimes.   Has loose stools, at most 7-8 bowel movements daily, at least 4-5 times a day. Often has to get up at night to pass stool.   Having pain across the front of stomach. Gets worse after eating.  Taking iron 4 days a week or so. Immodium has not helped with diarrhea.  Taking protonix daily.   Feeling like her heart is going in and out of afib more than usual. Not taking mylanta, peptobismal.  Feeling more fatigued than usual.  Relevant past medical, surgical, family and social history reviewed. Allergies and medications reviewed and updated. Social History   Tobacco Use  Smoking Status Never Smoker  Smokeless Tobacco Never Used   ROS: Per HPI   Objective:    BP 116/70   Pulse (!) 49   Temp (!) 96.6 F (35.9 C) (Oral)   Ht 5\' 3"  (1.6 m)   Wt 152 lb (68.9 kg)   BMI 26.93 kg/m   Wt Readings from Last 3 Encounters:  01/30/18 152 lb (68.9 kg)  12/31/17 160 lb (72.6 kg)  12/21/17 160 lb 6.4 oz (72.8 kg)   Gen: NAD, alert, cooperative with exam, NCAT EYES: EOMI, no conjunctival injection, or no icterus ENT: OP without erythema LYMPH: no cervical LAD CV: NRRR, normal S1/S2 Resp: CTABL, no wheezes, normal WOB Abd: +BS, soft, mildly tender with palpation throughout abdomen.  Nondistended.  Ext: No edema, warm Neuro: Alert and oriented MSK: normal muscle bulk  Assessment & Plan:  [name] was seen today for abdominal pain and blood in stools.  Diagnoses and all orders for this visit:  Dark stools We will check blood cell counts, stool for blood.   Also taking iron, may be causing dark stools though the dark stools are new over the last few weeks and she has been on the iron for a while. -     Fecal occult blood, imunochemical; Future -     CBC with Differential/Platelet  Epigastric pain Continue taking pantoprazole twice a day. -     Ambulatory referral to Gastroenterology  Iron deficiency anemia, unspecified iron deficiency anemia type -     Ferritin  Bradycardia A. Fib On metoprolol 50 mg twice daily.  Heart rate in upper 40s.  Because of new fatigue, will try decreasing to 25 mg twice a day.  Follow-up with symptoms in 2 weeks.  Continue Eliquis.  Follow up plan: Return in about 2 weeks (around 02/13/2018).  Will need repeat blood work. Assunta Found, MD [facility]

## 2018-01-31 LAB — CBC WITH DIFFERENTIAL/PLATELET
Basophils Absolute: 0.1 x10E3/uL (ref 0.0–0.2)
Basos: 1 %
EOS (ABSOLUTE): 0.3 x10E3/uL (ref 0.0–0.4)
Eos: 5 %
Hematocrit: 38.2 % (ref 34.0–46.6)
Hemoglobin: 13 g/dL (ref 11.1–15.9)
Immature Grans (Abs): 0 x10E3/uL (ref 0.0–0.1)
Immature Granulocytes: 1 %
Lymphocytes Absolute: 1.6 x10E3/uL (ref 0.7–3.1)
Lymphs: 25 %
MCH: 31.2 pg (ref 26.6–33.0)
MCHC: 34 g/dL (ref 31.5–35.7)
MCV: 92 fL (ref 79–97)
Monocytes Absolute: 0.7 x10E3/uL (ref 0.1–0.9)
Monocytes: 11 %
NRBC: 1 % — ABNORMAL HIGH (ref 0–0)
Neutrophils Absolute: 3.8 x10E3/uL (ref 1.4–7.0)
Neutrophils: 57 %
Platelets: 279 x10E3/uL (ref 150–450)
RBC: 4.17 x10E6/uL (ref 3.77–5.28)
RDW: 12.7 % (ref 11.7–15.4)
WBC: 6.5 x10E3/uL (ref 3.4–10.8)

## 2018-01-31 LAB — FERRITIN: Ferritin: 170 ng/mL — ABNORMAL HIGH (ref 15–150)

## 2018-02-01 ENCOUNTER — Telehealth: Payer: Self-pay | Admitting: Family Medicine

## 2018-02-01 ENCOUNTER — Encounter: Payer: Self-pay | Admitting: Pediatrics

## 2018-02-01 MED ORDER — METOPROLOL TARTRATE 50 MG PO TABS: 25.0000 mg | ORAL_TABLET | Freq: Two times a day (BID) | ORAL | 0 refills | Status: DC

## 2018-02-01 NOTE — Telephone Encounter (Signed)
Pt called and aware of labs

## 2018-02-05 ENCOUNTER — Encounter (HOSPITAL_COMMUNITY): Payer: Self-pay | Admitting: Emergency Medicine

## 2018-02-05 ENCOUNTER — Other Ambulatory Visit: Payer: Self-pay

## 2018-02-05 ENCOUNTER — Inpatient Hospital Stay (HOSPITAL_COMMUNITY)
Admission: EM | Admit: 2018-02-05 | Discharge: 2018-02-07 | DRG: 683 | Disposition: A | Discharge status: Home Health | Payer: Medicare Other | Attending: Internal Medicine | Admitting: Internal Medicine

## 2018-02-05 ENCOUNTER — Emergency Department (HOSPITAL_COMMUNITY): Payer: Medicare Other

## 2018-02-05 DIAGNOSIS — I5032 Chronic diastolic (congestive) heart failure: Secondary | ICD-10-CM | POA: Diagnosis not present

## 2018-02-05 DIAGNOSIS — R197 Diarrhea, unspecified: Secondary | ICD-10-CM | POA: Diagnosis not present

## 2018-02-05 DIAGNOSIS — Z881 Allergy status to other antibiotic agents status: Secondary | ICD-10-CM

## 2018-02-05 DIAGNOSIS — Z7901 Long term (current) use of anticoagulants: Secondary | ICD-10-CM

## 2018-02-05 DIAGNOSIS — R1084 Generalized abdominal pain: Secondary | ICD-10-CM | POA: Diagnosis not present

## 2018-02-05 DIAGNOSIS — Z882 Allergy status to sulfonamides status: Secondary | ICD-10-CM

## 2018-02-05 DIAGNOSIS — E876 Hypokalemia: Secondary | ICD-10-CM | POA: Diagnosis present

## 2018-02-05 DIAGNOSIS — Z8349 Family history of other endocrine, nutritional and metabolic diseases: Secondary | ICD-10-CM

## 2018-02-05 DIAGNOSIS — I130 Hypertensive heart and chronic kidney disease with heart failure and stage 1 through stage 4 chronic kidney disease, or unspecified chronic kidney disease: Secondary | ICD-10-CM | POA: Diagnosis present

## 2018-02-05 DIAGNOSIS — R739 Hyperglycemia, unspecified: Secondary | ICD-10-CM

## 2018-02-05 DIAGNOSIS — E860 Dehydration: Secondary | ICD-10-CM | POA: Diagnosis present

## 2018-02-05 DIAGNOSIS — I2510 Atherosclerotic heart disease of native coronary artery without angina pectoris: Secondary | ICD-10-CM | POA: Diagnosis present

## 2018-02-05 DIAGNOSIS — Z95810 Presence of automatic (implantable) cardiac defibrillator: Secondary | ICD-10-CM

## 2018-02-05 DIAGNOSIS — K580 Irritable bowel syndrome with diarrhea: Secondary | ICD-10-CM | POA: Diagnosis not present

## 2018-02-05 DIAGNOSIS — K219 Gastro-esophageal reflux disease without esophagitis: Secondary | ICD-10-CM | POA: Diagnosis not present

## 2018-02-05 DIAGNOSIS — Z66 Do not resuscitate: Secondary | ICD-10-CM | POA: Diagnosis not present

## 2018-02-05 DIAGNOSIS — I252 Old myocardial infarction: Secondary | ICD-10-CM

## 2018-02-05 DIAGNOSIS — R109 Unspecified abdominal pain: Secondary | ICD-10-CM | POA: Diagnosis not present

## 2018-02-05 DIAGNOSIS — F329 Major depressive disorder, single episode, unspecified: Secondary | ICD-10-CM | POA: Diagnosis present

## 2018-02-05 DIAGNOSIS — N179 Acute kidney failure, unspecified: Secondary | ICD-10-CM | POA: Diagnosis not present

## 2018-02-05 DIAGNOSIS — Z955 Presence of coronary angioplasty implant and graft: Secondary | ICD-10-CM

## 2018-02-05 DIAGNOSIS — I480 Paroxysmal atrial fibrillation: Secondary | ICD-10-CM | POA: Diagnosis present

## 2018-02-05 DIAGNOSIS — F419 Anxiety disorder, unspecified: Secondary | ICD-10-CM | POA: Diagnosis present

## 2018-02-05 DIAGNOSIS — E1165 Type 2 diabetes mellitus with hyperglycemia: Secondary | ICD-10-CM | POA: Diagnosis not present

## 2018-02-05 DIAGNOSIS — K449 Diaphragmatic hernia without obstruction or gangrene: Secondary | ICD-10-CM | POA: Diagnosis present

## 2018-02-05 DIAGNOSIS — N183 Chronic kidney disease, stage 3 unspecified: Secondary | ICD-10-CM | POA: Diagnosis present

## 2018-02-05 DIAGNOSIS — Z87440 Personal history of urinary (tract) infections: Secondary | ICD-10-CM

## 2018-02-05 DIAGNOSIS — Z888 Allergy status to other drugs, medicaments and biological substances status: Secondary | ICD-10-CM

## 2018-02-05 DIAGNOSIS — M1990 Unspecified osteoarthritis, unspecified site: Secondary | ICD-10-CM | POA: Diagnosis present

## 2018-02-05 DIAGNOSIS — N189 Chronic kidney disease, unspecified: Secondary | ICD-10-CM | POA: Diagnosis present

## 2018-02-05 DIAGNOSIS — H3530 Unspecified macular degeneration: Secondary | ICD-10-CM | POA: Diagnosis present

## 2018-02-05 DIAGNOSIS — Z91041 Radiographic dye allergy status: Secondary | ICD-10-CM

## 2018-02-05 DIAGNOSIS — Z833 Family history of diabetes mellitus: Secondary | ICD-10-CM

## 2018-02-05 DIAGNOSIS — R531 Weakness: Secondary | ICD-10-CM

## 2018-02-05 DIAGNOSIS — Z91040 Latex allergy status: Secondary | ICD-10-CM

## 2018-02-05 DIAGNOSIS — E785 Hyperlipidemia, unspecified: Secondary | ICD-10-CM | POA: Diagnosis present

## 2018-02-05 DIAGNOSIS — Z8249 Family history of ischemic heart disease and other diseases of the circulatory system: Secondary | ICD-10-CM

## 2018-02-05 DIAGNOSIS — E119 Type 2 diabetes mellitus without complications: Secondary | ICD-10-CM | POA: Diagnosis present

## 2018-02-05 DIAGNOSIS — Z79899 Other long term (current) drug therapy: Secondary | ICD-10-CM

## 2018-02-05 LAB — URINALYSIS, ROUTINE W REFLEX MICROSCOPIC
Bilirubin Urine: NEGATIVE
GLUCOSE, UA: NEGATIVE mg/dL
Ketones, ur: NEGATIVE mg/dL
LEUKOCYTES UA: NEGATIVE
Nitrite: NEGATIVE
PH: 5 (ref 5.0–8.0)
Protein, ur: NEGATIVE mg/dL
Specific Gravity, Urine: 1.009 (ref 1.005–1.030)

## 2018-02-05 LAB — CBC
HCT: 42.6 % (ref 36.0–46.0)
Hemoglobin: 13.9 g/dL (ref 12.0–15.0)
MCH: 30.5 pg (ref 26.0–34.0)
MCHC: 32.6 g/dL (ref 30.0–36.0)
MCV: 93.4 fL (ref 80.0–100.0)
PLATELETS: 264 10*3/uL (ref 150–400)
RBC: 4.56 MIL/uL (ref 3.87–5.11)
RDW: 13.5 % (ref 11.5–15.5)
WBC: 9.6 10*3/uL (ref 4.0–10.5)
nRBC: 0.3 % — ABNORMAL HIGH (ref 0.0–0.2)

## 2018-02-05 LAB — CUP PACEART INCLINIC DEVICE CHECK
Date Time Interrogation Session: 20200121152500
Implantable Lead Model: 157
Implantable Lead Serial Number: 138237
Implantable Pulse Generator Implant Date: 20160126
MDC IDC LEAD IMPLANT DT: 20090120
MDC IDC LEAD LOCATION: 753860
Pulse Gen Serial Number: 191956

## 2018-02-05 LAB — CBG MONITORING, ED: Glucose-Capillary: 132 mg/dL — ABNORMAL HIGH (ref 70–99)

## 2018-02-05 LAB — LACTIC ACID, PLASMA: Lactic Acid, Venous: 1.3 mmol/L (ref 0.5–1.9)

## 2018-02-05 LAB — COMPREHENSIVE METABOLIC PANEL
ALT: 26 U/L (ref 0–44)
AST: 19 U/L (ref 15–41)
Albumin: 3.9 g/dL (ref 3.5–5.0)
Alkaline Phosphatase: 83 U/L (ref 38–126)
Anion gap: 12 (ref 5–15)
BUN: 56 mg/dL — ABNORMAL HIGH (ref 8–23)
CO2: 16 mmol/L — ABNORMAL LOW (ref 22–32)
Calcium: 10.1 mg/dL (ref 8.9–10.3)
Chloride: 102 mmol/L (ref 98–111)
Creatinine, Ser: 2.64 mg/dL — ABNORMAL HIGH (ref 0.44–1.00)
GFR calc non Af Amer: 16 mL/min — ABNORMAL LOW (ref >60)
GFR, EST AFRICAN AMERICAN: 19 mL/min — AB (ref >60)
Glucose, Bld: 229 mg/dL — ABNORMAL HIGH (ref 70–99)
Potassium: 3.4 mmol/L — ABNORMAL LOW (ref 3.5–5.1)
Sodium: 130 mmol/L — ABNORMAL LOW (ref 135–145)
Total Bilirubin: 0.5 mg/dL (ref 0.3–1.2)
Total Protein: 7.2 g/dL (ref 6.5–8.1)

## 2018-02-05 LAB — MAGNESIUM: Magnesium: 2.2 mg/dL (ref 1.7–2.4)

## 2018-02-05 LAB — C DIFFICILE QUICK SCREEN W PCR REFLEX
C Diff antigen: NEGATIVE
C Diff interpretation: NOT DETECTED
C Diff toxin: NEGATIVE

## 2018-02-05 LAB — LIPASE, BLOOD: Lipase: 196 U/L — ABNORMAL HIGH (ref 11–51)

## 2018-02-05 MED ORDER — ROSUVASTATIN CALCIUM 20 MG PO TABS
20.0000 mg | ORAL_TABLET | Freq: Every day | ORAL | Status: DC
  Administered 2018-02-06: 20 mg via ORAL
  Filled 2018-02-05 (×2): qty 1

## 2018-02-05 MED ORDER — ONDANSETRON HCL 4 MG/2ML IJ SOLN
4.0000 mg | Freq: Once | INTRAMUSCULAR | Status: AC
  Administered 2018-02-05: 4 mg via INTRAVENOUS
  Filled 2018-02-05: qty 2

## 2018-02-05 MED ORDER — HYDROCORTISONE NA SUCCINATE PF 250 MG IJ SOLR: 200.0000 mg | Freq: Once | INTRAMUSCULAR | Status: DC

## 2018-02-05 MED ORDER — DIPHENHYDRAMINE HCL 25 MG PO CAPS: 50.0000 mg | ORAL_CAPSULE | Freq: Once | ORAL | Status: DC

## 2018-02-05 MED ORDER — DIPHENHYDRAMINE HCL 50 MG/ML IJ SOLN: 50.0000 mg | Freq: Once | INTRAMUSCULAR | Status: DC

## 2018-02-05 MED ORDER — SODIUM CHLORIDE 0.9 % IV BOLUS
500.0000 mL | Freq: Once | INTRAVENOUS | Status: AC
  Administered 2018-02-05: 500 mL via INTRAVENOUS

## 2018-02-05 MED ORDER — PAROXETINE HCL 20 MG PO TABS
20.0000 mg | ORAL_TABLET | Freq: Every day | ORAL | Status: DC
  Administered 2018-02-06 – 2018-02-07 (×2): 20 mg via ORAL
  Filled 2018-02-05 (×3): qty 1

## 2018-02-05 MED ORDER — ONDANSETRON HCL 4 MG PO TABS: 4.0000 mg | ORAL_TABLET | Freq: Four times a day (QID) | ORAL | Status: DC | PRN

## 2018-02-05 MED ORDER — APIXABAN 5 MG PO TABS
5.0000 mg | ORAL_TABLET | Freq: Two times a day (BID) | ORAL | Status: DC
  Administered 2018-02-05 – 2018-02-06 (×2): 5 mg via ORAL
  Filled 2018-02-05 (×2): qty 1

## 2018-02-05 MED ORDER — LACTATED RINGERS IV SOLN
INTRAVENOUS | Status: DC
  Administered 2018-02-05 – 2018-02-07 (×3): via INTRAVENOUS

## 2018-02-05 MED ORDER — INSULIN ASPART 100 UNIT/ML ~~LOC~~ SOLN
0.0000 [IU] | Freq: Four times a day (QID) | SUBCUTANEOUS | Status: DC
  Administered 2018-02-05 – 2018-02-06 (×3): 1 [IU] via SUBCUTANEOUS
  Administered 2018-02-07: 3 [IU] via SUBCUTANEOUS
  Administered 2018-02-07: 1 [IU] via SUBCUTANEOUS
  Filled 2018-02-05: qty 1

## 2018-02-05 MED ORDER — FENTANYL CITRATE (PF) 100 MCG/2ML IJ SOLN: 25.0000 ug | INTRAMUSCULAR | Status: DC | PRN

## 2018-02-05 MED ORDER — HYDROCORTISONE NA SUCCINATE PF 250 MG IJ SOLR
200.0000 mg | Freq: Once | INTRAMUSCULAR | Status: DC
  Filled 2018-02-05: qty 200

## 2018-02-05 MED ORDER — LOPERAMIDE HCL 2 MG PO CAPS
2.0000 mg | ORAL_CAPSULE | ORAL | Status: DC | PRN
  Filled 2018-02-05: qty 1

## 2018-02-05 MED ORDER — ONDANSETRON HCL 4 MG/2ML IJ SOLN: 4.0000 mg | Freq: Four times a day (QID) | INTRAMUSCULAR | Status: DC | PRN

## 2018-02-05 MED ORDER — SODIUM CHLORIDE 0.9 % IV SOLN
INTRAVENOUS | Status: DC
  Administered 2018-02-05: 13:00:00 via INTRAVENOUS

## 2018-02-05 MED ORDER — METOPROLOL TARTRATE 25 MG PO TABS
25.0000 mg | ORAL_TABLET | Freq: Two times a day (BID) | ORAL | Status: DC
  Administered 2018-02-06 – 2018-02-07 (×2): 25 mg via ORAL
  Filled 2018-02-05 (×4): qty 1

## 2018-02-05 MED ORDER — POTASSIUM CHLORIDE 10 MEQ/100ML IV SOLN
10.0000 meq | INTRAVENOUS | Status: AC
  Administered 2018-02-05 (×4): 10 meq via INTRAVENOUS
  Filled 2018-02-05 (×3): qty 100

## 2018-02-05 NOTE — ED Triage Notes (Signed)
Pt c/o of abd pain with n/d.  Saw PCP to give a stool sample but was unable to go to PCP due to being sick.  Has appt for GI tomorrow but family states pt has worsened over night.   Unable to eat or drink without getting sick

## 2018-02-05 NOTE — Progress Notes (Signed)
Brief note regarding plan, with full H&P to follow:   [name] is a 85 y.o. female with medical history significant for chronic diarrhea, diarrhea predominant irritable bowel syndrome, stage III chronic kidney disease with baseline creatinine of 1.2-1.4, paroxysmal atrial fibrillation chronically anticoagulated on Eliquis, chronic diastolic heart failure, who is admitted to [facility] on 02/05/2018 with acute kidney injury superimposed on chronic kidney disease after presenting from home to the [facility] emergency department complaining of 3 to 4 days of increased frequency of diarrhea.     #) Acute kidney injury: Relative to stage III chronic kidney disease with baseline creatinine 1.2-1.4, with most recent prior creatinine data point of 1.37 on 12/31/2017, presenting labs reflect serum creatinine of 2.64.  This appears to be prerenal in nature on the basis of interval dehydration stemming from the patient's report of recent increase in frequency of chronic diarrhea coinciding with decline in oral intake due to decreased appetite over that time.  There may also be a pharmacologic factor, given that the patient believes that lisinopril was added to her outpatient antihypertensive regimen in the interval since the creatinine data point taken on 12/31/17.  Of note, presenting urinalysis was not associated with any urinary cast.  Plan: Gentle IV fluids with lactated Ringer's, with close attention for subsequent development of evidence of volume overload given the patient's history of chronic diastolic heart failure.  Monitor strict I's and O's.  Attempt avoid nephrotoxic agents.  Will repeat BMP in the morning.  Hold home lisinopril.  Check random urine sodium as well as random urine creatinine.    Babs Bertin, DO Hospitalist

## 2018-02-05 NOTE — ED Provider Notes (Addendum)
[facility] EMERGENCY DEPARTMENT Provider Note   CSN: [id number] Arrival date & time: 02/05/18  1033     History   Chief Complaint Chief Complaint  Patient presents with  . Abdominal Pain    HPI [name] is a 85 y.o. female.  Patient brought in by family members.  Patient with complaint of some generalized abdominal pain 5 out of 10 nausea for 2 weeks.  Decreased appetite for the past 4 days.  And yellow loose diarrhea for the past 4 days.  Denies fever or any recent travel.  No exposure to anyone with similar illness.  Patient's past medical history is significant for coronary artery disease has a defibrillator left anterior chest.  Is on Eliquis has a history of atrial fibrillation.  Is on Demadex Lopressor lisinopril and Norvasc.  Possible that Norvasc may have been switched to the lisinopril in December.  Patient feeling very fatigued and weak     Past Medical History:  Diagnosis Date  . AICD (automatic cardioverter/defibrillator) present   . Allergy    SESONAL  . Anxiety   . ARTHRITIS   . Arthritis   . Atrial fibrillation ([place])   . CAD (coronary artery disease)    a. history of cardiac arrest 1993;  b. s/p LAD/LCX stenting in 2003;  c. 07/2011 Cath: LM nl, LAD patent stent, D1 80ost, LCX patent stent, RCA min irregs;  d. 04/2012 MV: EF 66%, no ishcemia;  e. 12/2013 Echo: Ef 55%, no rwma, Gr 1 DD, triv AI/MR, mildly dil LA;  f. 12/2013 Lexi MV: intermediate risk - apical ischemia and inf/infsept fixed defect, ? artifactual.  . CAD in native artery 12/31/2013   Previous stents to LAD and Ramus patent on cath today 12/31/13 also There is severe disease in the ostial first diagonal which is unchanged from most recent cardiac catheterization. The right coronary artery could not be engaged selectively but nonselective angiography showed no significant disease in the proximal and midsegment.     . Cataract    DENIES  . Chronic back pain   . Chronic sinus bradycardia   .  CKD (chronic kidney disease), stage III ([place])   . Clotting disorder (HCC)    DVT  . Diabetes mellitus without complication ([place])    DENIES  . Diverticulosis of colon (without mention of hemorrhage) 2007   Colonoscopy   . Esophageal stricture    a. 2012 s/p dil.  . Esophagitis, unspecified    a. 2012 EGD  . EXTERNAL HEMORRHOIDS   . GERD (gastroesophageal reflux disease)    omeprazole  . H/O hiatal hernia   . Hiatal hernia    a. 2012 EGD.  Marland Kitchen History of DVT in the past, not on Coumadin now    left  leg  . HYPERCHOLESTEROLEMIA   . Hypertension   . ICD (implantable cardiac defibrillator) in place    a. s/p initial ICD in 1993 in setting of cardiac arrest;  b. 01/2007 gen change: Guidant T135 Vitality DS VR single lead ICD.  . Macular degeneration    gets injecton in eye every 5 weeks- last injection - 05/03/2013   . Myocardial infarction ([place]) 1993  . Nephrolithiasis, just saw Dr [name]   . Orthostatic hypotension   . Peripheral vascular disease ([place])    ???  . Unspecified gastritis and gastroduodenitis without mention of hemorrhage    a. 2003 EGD->not noted on 2012 EGD.    Patient Active Problem List   Diagnosis Date  Noted  . Essential hypertension 12/21/2017  . Diet-controlled diabetes mellitus ([place]) 12/21/2017  . History of drug-induced prolonged QT interval with torsade de pointes 11/01/2016  . Iron deficiency anemia 10/30/2016  . Long term current use of anticoagulant 02/17/2016  . Near syncope 05/22/2015  . Diabetes mellitus type 2, diet-controlled ([place]) 09/14/2014  . ICD (implantable cardioverter-defibrillator) battery depletion 01/20/2014  . Abnormal nuclear stress test, 12/30/13 12/31/2013  . Coronary artery disease involving native coronary artery of native heart without angina pectoris 12/31/2013  . Unstable angina, neg MI, cath stable.maybe GI 12/30/2013  . Paroxysmal atrial fibrillation, chads2 Vasc2 score of 5, on eliquis 10/16/2013  . Catecholaminergic  polymorphic ventricular tachycardia ([place]) 10/16/2013  . Ureteral stone with hydronephrosis 05/20/2013  . Metabolic syndrome 16/10/9602  . Hypotension, meds stopped this admission 07/29/2011  . Orthostatic hypotension 07/27/2011  . Chest pain, r/o cardiac, negative MI 07/25/2011  . Chronic sinus bradycardia 07/25/2011  . ICD (implantable cardioverter-defibrillator) in place 07/25/2011  . CKD (chronic kidney disease) stage 3, GFR 30-59 ml/min (HCC) 07/25/2011    Class: Chronic  . History of DVT in the past, on eliquis now 07/25/2011    Class: History of  . Nephrolithiasis, just saw Dr [name] 07/25/2011    Class: History of  . DYSPHAGIA 03/24/2010  . Chronic ischemic heart disease 12/03/2007  . RECTAL BLEEDING 12/03/2007  . Irritable bowel syndrome with diarrhea 12/03/2007  . EPIGASTRIC PAIN 12/03/2007  . Hypercholesterolemia 12/02/2007    Class: History of  . EXTERNAL HEMORRHOIDS 12/02/2007    Class: History of  . ESOPHAGITIS 12/02/2007  . GASTRITIS 12/02/2007  . COLITIS 12/02/2007  . DIVERTICULOSIS, COLON 12/02/2007  . ARTHRITIS 12/02/2007    Class: Chronic    Past Surgical History:  Procedure Laterality Date  . BREAST BIOPSY Left 11/2013  . CARDIAC CATHETERIZATION    . CARDIAC CATHETERIZATION  01/01/15   patent stents -there is severe disease in ostial 1st diag but without change.   Marland Kitchen CARDIAC DEFIBRILLATOR PLACEMENT  02/05/2007   Guidant  . CARDIOVASCULAR STRESS TEST  12/30/2013   abnormal  . COLONOSCOPY    . coronary stents     . CYSTOSCOPY WITH URETEROSCOPY Right 05/20/2013   Procedure: CYSTOSCOPY, RIGHT URETEROSCOPY STONE EXTRACTION, Insertion of right DOUBLE J STENT ;  Surgeon: Irine Seal, MD;  Location: [facility];  Service: Urology;  Laterality: Right;  . CYSTOSCOPY WITH URETEROSCOPY AND STENT PLACEMENT N/A 06/03/2013   Procedure: SECOND LOOK CYSTOSCOPY WITH URETEROSCOPY  HOLMIUM LASER LITHO AND STONE EXTRACTION Sammie Bench ;  Surgeon: Malka So, MD;  Location: [facility]  [facility];  Service: Urology;  Laterality: N/A;  . GIVENS CAPSULE STUDY N/A 10/30/2016   Procedure: GIVENS CAPSULE STUDY;  Surgeon: Irene Shipper, MD;  Location: House;  Service: Endoscopy;  Laterality: N/A;  NEEDS TO BE ADMITTED FOR OBSERVATION PT. HAS DEFIB  . HEMORROIDECTOMY  80's  . HOLMIUM LASER APPLICATION Right 05/20/979   Procedure: HOLMIUM LASER APPLICATION;  Surgeon: Irine Seal, MD;  Location: [facility];  Service: Urology;  Laterality: Right;  . HYSTEROSCOPY W/D&C  09/02/2010   Procedure: DILATATION AND CURETTAGE (D&C) /HYSTEROSCOPY;  Surgeon: Margarette Asal;  Location: [facility];  Service: Gynecology;  Laterality: N/A;  Dilation and Curettage with Hysteroscopy and Polypectomy  . IMPLANTABLE CARDIOVERTER DEFIBRILLATOR (ICD) GENERATOR CHANGE N/A 02/10/2014   Procedure: ICD GENERATOR CHANGE;  Surgeon: Sanda Klein, MD;  Location: [place] CATH LAB;  Service: Cardiovascular;  Laterality: N/A;  . LEFT HEART CATHETERIZATION WITH CORONARY  ANGIOGRAM N/A 07/28/2011   Procedure: LEFT HEART CATHETERIZATION WITH CORONARY ANGIOGRAM;  Surgeon: Lorretta Harp, MD;  Location: [facility] CATH LAB;  Service: Cardiovascular;  Laterality: N/A;  . LEFT HEART CATHETERIZATION WITH CORONARY ANGIOGRAM N/A 12/31/2013   Procedure: LEFT HEART CATHETERIZATION WITH CORONARY ANGIOGRAM;  Surgeon: Wellington Hampshire, MD;  Location: [place] CATH LAB;  Service: Cardiovascular;  Laterality: N/A;     OB History   No obstetric history on file.      Home Medications    Prior to Admission medications   Medication Sig Start Date End Date Taking? Authorizing Provider  amLODipine (NORVASC) 2.5 MG tablet Take 1 tablet (2.5 mg total) by mouth daily. 03/14/17  Yes Croitoru, Mihai, MD  calcium carbonate (TUMS - DOSED IN MG ELEMENTAL CALCIUM) 500 MG chewable tablet Chew 2 tablets by mouth 3 (three) times daily as needed for indigestion or heartburn.   Yes [provider]  Calcium Citrate-Vitamin D (CITRACAL + D PO) Take 1 tablet by mouth  daily.   Yes [provider]  Cholecalciferol (VITAMIN D) 1000 UNITS capsule Take 2,000 Units by mouth daily.    Yes [provider]  ELIQUIS 5 MG TABS tablet Take 1 tablet (5 mg total) by mouth 2 (two) times daily. 11/13/17  Yes Croitoru, Mihai, MD  lisinopril (PRINIVIL,ZESTRIL) 20 MG tablet Take 1 tablet (20 mg total) by mouth 2 (two) times daily. 01/11/18  Yes Croitoru, Mihai, MD  metoprolol tartrate (LOPRESSOR) 50 MG tablet Take 0.5 tablets (25 mg total) by mouth 2 (two) times daily. 02/01/18  Yes Eustaquio Maize, MD  Multiple Vitamins-Minerals (CENTRUM SILVER ULTRA WOMENS PO) Take 1 tablet by mouth daily.   Yes [provider]  nitroGLYCERIN (NITROSTAT) 0.4 MG SL tablet Place 1 tablet (0.4 mg total) under the tongue every 5 (five) minutes as needed for chest pain. 12/31/13  Yes Isaiah Serge, NP  pantoprazole (PROTONIX) 40 MG tablet Take 1 tablet (40 mg total) by mouth 2 (two) times daily. 01/17/18  Yes Croitoru, Mihai, MD  PARoxetine (PAXIL) 20 MG tablet TAKE (1) TABLET DAILY IN THE MORNING. 11/12/17  Yes Chipper Herb, MD  rosuvastatin (CRESTOR) 40 MG tablet Take 0.5 tablets (20 mg total) by mouth daily. 01/18/18  Yes Chipper Herb, MD  torsemide (DEMADEX) 20 MG tablet Take 20 mg by mouth daily as needed (edema).   Yes [provider]    Family History Family History  Problem Relation Age of Onset  . Stroke Mother   . Other Mother        brain tumor  . Other Father        MI  . Heart attack Father   . Stroke Sister   . Macular degeneration Sister   . Diabetes Daughter   . Cancer Daughter        ovarian  . Colon polyps Daughter   . Atrial fibrillation Sister   . Hyperlipidemia Sister   . Osteoporosis Sister   . Stroke Sister   . Uterine cancer Sister   . Heart attack Brother   . Heart disease Brother   . Asthma Brother   . Breast cancer Neg Hx     Social History Social History   Tobacco Use  . Smoking status: Never Smoker  .  Smokeless tobacco: Never Used  Substance Use Topics  . Alcohol use: No  . Drug use: No     Allergies   Sotalol; Bactrim [sulfamethoxazole-trimethoprim]; Ciprofloxacin; Actos [pioglitazone]; Adhesive [tape];  Contrast media [iodinated diagnostic agents]; Latex; and Lipitor [atorvastatin]   Review of Systems Review of Systems  Constitutional: Positive for appetite change and fatigue. Negative for chills and fever.  HENT: Negative for rhinorrhea and sore throat.   Eyes: Negative for redness and visual disturbance.  Respiratory: Negative for cough and shortness of breath.   Cardiovascular: Negative for chest pain and leg swelling.  Gastrointestinal: Positive for abdominal pain, diarrhea and nausea. Negative for blood in stool and vomiting.  Genitourinary: Negative for dysuria.  Musculoskeletal: Negative for back pain and neck pain.  Skin: Negative for rash.  Neurological: Negative for dizziness, syncope, light-headedness and headaches.  Hematological: Does not bruise/bleed easily.  Psychiatric/Behavioral: Negative for confusion.     Physical Exam Updated Vital Signs BP 112/72   Pulse 70   Temp (!) 97.4 F (36.3 C) (Oral)   Resp 18   Ht 1.6 m (5\' 3" )   Wt 68.9 kg   SpO2 93%   BMI 26.93 kg/m   Physical Exam Vitals signs and nursing note reviewed.  Constitutional:      General: She is not in acute distress.    Appearance: She is well-developed.  HENT:     Head: Normocephalic and atraumatic.     Mouth/Throat:     Mouth: Mucous membranes are dry.  Eyes:     Conjunctiva/sclera: Conjunctivae normal.  Neck:     Musculoskeletal: Neck supple.  Cardiovascular:     Rate and Rhythm: Normal rate. Rhythm irregular.     Heart sounds: No murmur.  Pulmonary:     Effort: Pulmonary effort is normal. No respiratory distress.     Breath sounds: Normal breath sounds. No wheezing or rales.     Comments: Palpable pacemaker defibrillator left anterior chest. Abdominal:     General:  Bowel sounds are normal.     Palpations: Abdomen is soft.     Tenderness: There is abdominal tenderness.     Comments: Diffuse tenderness no guarding  Genitourinary:    Rectum: Normal. No mass or tenderness.     Comments: Rectal exam with no mass no stool.  No blood.  No significant perianal irritation. Musculoskeletal: Normal range of motion.  Skin:    General: Skin is warm and dry.     Capillary Refill: Capillary refill takes less than 2 seconds.  Neurological:     General: No focal deficit present.     Mental Status: She is alert and oriented to person, place, and time.      ED Treatments / Results  Labs (all labs ordered are listed, but only abnormal results are displayed) Labs Reviewed  LIPASE, BLOOD - Abnormal; Notable for the following components:      Result Value   Lipase 196 (*)    All other components within normal limits  COMPREHENSIVE METABOLIC PANEL - Abnormal; Notable for the following components:   Sodium 130 (*)    Potassium 3.4 (*)    CO2 16 (*)    Glucose, Bld 229 (*)    BUN 56 (*)    Creatinine, Ser 2.64 (*)    GFR calc non Af Amer 16 (*)    GFR calc Af Amer 19 (*)    All other components within normal limits  CBC - Abnormal; Notable for the following components:   nRBC 0.3 (*)    All other components within normal limits  C DIFFICILE QUICK SCREEN W PCR REFLEX  URINALYSIS, ROUTINE W REFLEX MICROSCOPIC  LACTIC ACID, PLASMA  EKG None  Radiology Ct Abdomen Pelvis Wo Contrast  Result Date: 02/05/2018 CLINICAL DATA:  Abdominal pain EXAM: CT ABDOMEN AND PELVIS WITHOUT CONTRAST TECHNIQUE: Multidetector CT imaging of the abdomen and pelvis was performed following the standard protocol without IV contrast. COMPARISON:  05/11/2017 FINDINGS: Lower chest: Transvenous pacing leads partially visualized. Scattered coronary calcifications. No pleural or pericardial effusion. Visualized lung bases clear. Hepatobiliary: No focal liver abnormality is seen. No  gallstones, gallbladder wall thickening, or biliary dilatation. Pancreas: Unremarkable. No pancreatic ductal dilatation or surrounding inflammatory changes. Spleen: Normal in size without focal abnormality. Adrenals/Urinary Tract: Normal adrenals. Renal atrophy left worse than right, with scattered areas of calcifications/mineralization. No hydronephrosis. Stomach/Bowel: Stomach is physiologically distended. Small bowel decompressed. Normal appendix. The colon is nondilated. Moderate rectal distention by fecal material. Vascular/Lymphatic: Moderate aortoiliac atheromatous calcifications without aneurysm. No abdominal or pelvic adenopathy localized. Reproductive: Uterus and bilateral adnexa are unremarkable. Other: No ascites. No free air. Musculoskeletal: Lumbar levoscoliosis with multilevel spondylitic change. Chronic T12 compression deformity. No acute fracture or worrisome bone lesion. IMPRESSION: 1. No acute findings. 2. Nonobstructive bowel gas pattern. Moderate rectal distention by fecal material. 3. Coronary and aortoiliac atherosclerosis (ICD10-170.0). Electronically Signed   By: Lucrezia Europe M.D.   On: 02/05/2018 14:51   Dg Chest 1 View  Result Date: 02/05/2018 CLINICAL DATA:  Weakness, abdominal pain, nausea and diarrhea, history coronary artery disease post MI, stage III chronic kidney disease, clotting disorder, diabetes mellitus, hypertension EXAM: CHEST  1 VIEW COMPARISON:  05/19/2013 FINDINGS: LEFT subclavian ICD with leads projecting over RIGHT ventricle. Enlargement of cardiac silhouette. Atherosclerotic calcification aorta. Mediastinal contours and pulmonary vascularity normal. Lungs clear. No pulmonary infiltrate, pleural effusion or pneumothorax. Bones demineralized. IMPRESSION: Enlargement of cardiac silhouette post ICD. No acute abnormalities. Electronically Signed   By: Lavonia Dana M.D.   On: 02/05/2018 12:49    Procedures Procedures (including critical care time)  Medications Ordered  in ED Medications  0.9 %  sodium chloride infusion ( Intravenous New Bag/Given 02/05/18 1325)  sodium chloride 0.9 % bolus 500 mL (0 mLs Intravenous Stopped 02/05/18 1324)  ondansetron (ZOFRAN) injection 4 mg (4 mg Intravenous Given 02/05/18 1224)     Initial Impression / Assessment and Plan / ED Course  I have reviewed the triage vital signs and the nursing notes.  Pertinent labs & imaging results that were available during my care of the patient were reviewed by me and considered in my medical decision making (see chart for details).     Patient with significantly elevated BUN and creatinine not so not a candidate for IV contrast based on that.  Initially had ordered the premedications due to her dye allergy.  It appears that was only listed as a headache so probably does not require that.  We will go forward with the CT abdomen and pelvis with oral contrast only.   Patient now not feeling well for about 2 weeks.  Pretty significant diarrhea for the past 4 days.  Clinically dehydrated.  BUN and creatinines consistent with acute kidney injury.  Probably due to dehydration.  Patient will be hydrated here gently.  Patient is cardiac monitoring EKG consistent with atrial fibrillation.  Rate controlled.  Patient's LFTs otherwise without any acute findings.  Other than the lipase being elevated at 196.  Patient has been on antibiotics frequently for urinary tract infection but none recently.  CT scan of the abdomen with oral contrast is pending.  Patient will require admission just for the acute kidney  injury by itself.  Patient's blood sugar elevated she denies having diabetes.  There was a question was raised about it before.  She is not on any medication for it.  C. difficile has been ordered as well.   CT scan results without any acute findings.  Although patient has elevated lipase pancreas was normal.  No evidence of enteritis or colitis.  No evidence of any bowel obstruction.  Made  note of a moderate amount of stool in the rectum.  On rectal exam patient has no palpable stool no mass either.  Patient also just had a liquid bowel movement.  So not clear what this is.  Family believes that patient has had a colonoscopy at least in the last 6 years may have been as early as 3 years ago.  Hospitalist will admit.  They are aware of the disconnect on the CT findings to based on her exam.  In addition no well blood on rectal exam.  No significant perianal irritation.    Final Clinical Impressions(s) / ED Diagnoses   Final diagnoses:  Generalized abdominal pain  Diarrhea, unspecified type  AKI (acute kidney injury) [facility])  Hyperglycemia    ED Discharge Orders    None       Fredia Sorrow, MD 02/05/18 1434    Fredia Sorrow, MD 02/05/18 [phone]

## 2018-02-05 NOTE — H&P (Signed)
History and Physical    PLEASE NOTE THAT DRAGON DICTATION SOFTWARE WAS USED IN THE CONSTRUCTION OF THIS NOTE.   [name] WYO:[id number] DOB: Jun 08, 1931 DOA: 02/05/2018  PCP: Chipper Herb, MD Patient coming from: home  I have personally briefly reviewed patient's old medical records in [place]  Chief Complaint: increase in baseline diarrhea  HPI: [name] is a 85 y.o. female with medical history significant for chronic diarrhea, diarrhea predominant irritable bowel syndrome, stage III chronic kidney disease with baseline creatinine of 1.2-1.4, paroxysmal atrial fibrillation chronically anticoagulated on Eliquis, chronic diastolic heart failure, who is admitted to [facility] on 02/05/2018 with acute kidney injury superimposed on chronic kidney disease after presenting from home to the [facility] emergency department complaining of 3 to 4 days of increased frequency of diarrhea.   The patient reports a history of chronic diarrhea in the context of a working diagnosis of diarrhea predominant irritable bowel syndrome per outpatient gastroenterology.  She reports that this is been associated with 3-5 daily episodes of loose stool for at least the last 6 months.  However, relative to this baseline, the patient reports an increase in the frequency of her episodes of loose stool over the preceding 3 to 4 days, noting 10-12 episodes of loose stool over that time in the absence of any associated melena or hematochezia.  Denies any associated subjective fever, chills, rigors, or generalized myalgias.  Reports associated nausea in the absence of any vomiting. denies any recent travel or known sick contacts.  She notes chronic, generalized abdominal discomfort for at least the last 6 months, without any recent worsening thereof.  No recent trauma. The patient reports that her most recent antibiotic use occurred approximately 1 month ago, at which time she completed a  5-day course of ciprofloxacin for urinary tract infection.  Denies any subsequent antibiotic use.  She believes that her most recent routine screening colonoscopy occurred approximately 3 years ago.  In terms of outpatient medications that can be associated with causing diarrhea, the patient reports that she has been on the same dose/frequency of Protonix for several years now, without any recent modifications to this medication.   Aside from aforementioned ciprofloxacin use 1 month ago, the patient reports that the only other recent modification to her outpatient medication regimen is the addition of lisinopril approximately 3 to 4 weeks ago.    ED Course: Vital signs in the emergency department were notable for the following: T-max 97.4; heart rate 71-84; blood pressure ranged from 91/64 to 105/66; respiratory rate 14-22, and oxygen saturation 93 to 96% on room air.  Labs performed in the ED were notable for the following: CMP notable for potassium 3.4, creatinine 2.64 relative to most recent prior creatinine data point of 1.37 on 12/31/2017; AST 19, ALT 26, alkaline phosphatase 83, total bilirubin 0.5; lipase 196.  C see notable for white blood cell count of 9600; lactic acid 1.3.  Urinalysis showed no white blood cells, no squamous epithelial cells, was noted to be nitrite and leukocyte Esterace negative.  C. difficile toxins a and B were found to be negative.  CT abdomen/pelvis without contrast, per final radiology report, showed no evidence of acute intra-abdominal process, including no evidence of obstruction, abscess, or perforation. DRE performed by the ED physician reportedly revealed no evidence of gross blood.  While in the ED, the patient received Zofran 4 mg IV x1, a 500 cc IV normal saline bolus, with subsequent initiation of  normal saline at 75 cc/h.  Subsequently, the patient was admitted to the [place] floor for further evaluation and management of presenting acute kidney injury  superimposed on stage III chronic kidney disease.   Review of Systems: As per HPI otherwise 10 point review of systems negative.   Past Medical History:  Diagnosis Date  . AICD (automatic cardioverter/defibrillator) present   . Allergy    SESONAL  . Anxiety   . ARTHRITIS   . Arthritis   . Atrial fibrillation ([place])   . CAD (coronary artery disease)    a. history of cardiac arrest 1993;  b. s/p LAD/LCX stenting in 2003;  c. 07/2011 Cath: LM nl, LAD patent stent, D1 80ost, LCX patent stent, RCA min irregs;  d. 04/2012 MV: EF 66%, no ishcemia;  e. 12/2013 Echo: Ef 55%, no rwma, Gr 1 DD, triv AI/MR, mildly dil LA;  f. 12/2013 Lexi MV: intermediate risk - apical ischemia and inf/infsept fixed defect, ? artifactual.  . CAD in native artery 12/31/2013   Previous stents to LAD and Ramus patent on cath today 12/31/13 also There is severe disease in the ostial first diagonal which is unchanged from most recent cardiac catheterization. The right coronary artery could not be engaged selectively but nonselective angiography showed no significant disease in the proximal and midsegment.     . Cataract    DENIES  . Chronic back pain   . Chronic sinus bradycardia   . CKD (chronic kidney disease), stage III ([place])   . Clotting disorder (HCC)    DVT  . Diabetes mellitus without complication ([place])    DENIES  . Diverticulosis of colon (without mention of hemorrhage) 2007   Colonoscopy   . Esophageal stricture    a. 2012 s/p dil.  . Esophagitis, unspecified    a. 2012 EGD  . EXTERNAL HEMORRHOIDS   . GERD (gastroesophageal reflux disease)    omeprazole  . H/O hiatal hernia   . Hiatal hernia    a. 2012 EGD.  Marland Kitchen History of DVT in the past, not on Coumadin now    left  leg  . HYPERCHOLESTEROLEMIA   . Hypertension   . ICD (implantable cardiac defibrillator) in place    a. s/p initial ICD in 1993 in setting of cardiac arrest;  b. 01/2007 gen change: Guidant T135 Vitality DS VR single lead ICD.  . Macular  degeneration    gets injecton in eye every 5 weeks- last injection - 05/03/2013   . Myocardial infarction ([place]) 1993  . Nephrolithiasis, just saw Dr [name]   . Orthostatic hypotension   . Peripheral vascular disease ([place])    ???  . Unspecified gastritis and gastroduodenitis without mention of hemorrhage    a. 2003 EGD->not noted on 2012 EGD.    Past Surgical History:  Procedure Laterality Date  . BREAST BIOPSY Left 11/2013  . CARDIAC CATHETERIZATION    . CARDIAC CATHETERIZATION  01/01/15   patent stents -there is severe disease in ostial 1st diag but without change.   Marland Kitchen CARDIAC DEFIBRILLATOR PLACEMENT  02/05/2007   Guidant  . CARDIOVASCULAR STRESS TEST  12/30/2013   abnormal  . COLONOSCOPY    . coronary stents     . CYSTOSCOPY WITH URETEROSCOPY Right 05/20/2013   Procedure: CYSTOSCOPY, RIGHT URETEROSCOPY STONE EXTRACTION, Insertion of right DOUBLE J STENT ;  Surgeon: Irine Seal, MD;  Location: [facility];  Service: Urology;  Laterality: Right;  . CYSTOSCOPY WITH URETEROSCOPY AND STENT PLACEMENT N/A 06/03/2013  Procedure: SECOND LOOK CYSTOSCOPY WITH URETEROSCOPY  HOLMIUM LASER LITHO AND STONE EXTRACTION Sammie Bench ;  Surgeon: Malka So, MD;  Location: [facility];  Service: Urology;  Laterality: N/A;  . GIVENS CAPSULE STUDY N/A 10/30/2016   Procedure: GIVENS CAPSULE STUDY;  Surgeon: Irene Shipper, MD;  Location: [place];  Service: Endoscopy;  Laterality: N/A;  NEEDS TO BE ADMITTED FOR OBSERVATION PT. HAS DEFIB  . HEMORROIDECTOMY  80's  . HOLMIUM LASER APPLICATION Right 0/0/8676   Procedure: HOLMIUM LASER APPLICATION;  Surgeon: Irine Seal, MD;  Location: [facility];  Service: Urology;  Laterality: Right;  . HYSTEROSCOPY W/D&C  09/02/2010   Procedure: DILATATION AND CURETTAGE (D&C) /HYSTEROSCOPY;  Surgeon: Margarette Asal;  Location: [place] [facility];  Service: Gynecology;  Laterality: N/A;  Dilation and Curettage with Hysteroscopy and Polypectomy  . IMPLANTABLE CARDIOVERTER DEFIBRILLATOR (ICD)  GENERATOR CHANGE N/A 02/10/2014   Procedure: ICD GENERATOR CHANGE;  Surgeon: Sanda Klein, MD;  Location: [place] CATH LAB;  Service: Cardiovascular;  Laterality: N/A;  . LEFT HEART CATHETERIZATION WITH CORONARY ANGIOGRAM N/A 07/28/2011   Procedure: LEFT HEART CATHETERIZATION WITH CORONARY ANGIOGRAM;  Surgeon: Lorretta Harp, MD;  Location[facility];  Service: Cardiovascular;  Laterality: N/A;  . LEFT HEART CATHETERIZATION WITH CORONARY ANGIOGRAM N/A 12/31/2013   Procedure: LEFT HEART CATHETERIZATION WITH CORONARY ANGIOGRAM;  Surgeon: Wellington Hampshire, MD;  Location: [place] CATH LAB;  Service: Cardiovascular;  Laterality: N/A;    Social History:  reports that she has never smoked. She has never used smokeless tobacco. She reports that she does not drink alcohol or use drugs.   Allergies  Allergen Reactions  . Sotalol Other (See Comments)    Torsades   . Bactrim [Sulfamethoxazole-Trimethoprim]     Itchy rash  . Ciprofloxacin Nausea Only  . Actos [Pioglitazone] Swelling  . Adhesive [Tape] Other (See Comments)    Causes sores  . Contrast Media [Iodinated Diagnostic Agents] Other (See Comments)    Headache (no action/pre-med required)  . Latex Rash  . Lipitor [Atorvastatin] Other (See Comments)    myalgia    Family History  Problem Relation Age of Onset  . Stroke Mother   . Other Mother        brain tumor  . Other Father        MI  . Heart attack Father   . Stroke Sister   . Macular degeneration Sister   . Diabetes Daughter   . Cancer Daughter        ovarian  . Colon polyps Daughter   . Atrial fibrillation Sister   . Hyperlipidemia Sister   . Osteoporosis Sister   . Stroke Sister   . Uterine cancer Sister   . Heart attack Brother   . Heart disease Brother   . Asthma Brother   . Breast cancer Neg Hx      Prior to Admission medications   Medication Sig Start Date End Date Taking? Authorizing Provider  amLODipine (NORVASC) 2.5 MG tablet Take 1 tablet (2.5 mg total) by  mouth daily. 03/14/17  Yes Croitoru, Mihai, MD  calcium carbonate (TUMS - DOSED IN MG ELEMENTAL CALCIUM) 500 MG chewable tablet Chew 2 tablets by mouth 3 (three) times daily as needed for indigestion or heartburn.   Yes [provider]  Calcium Citrate-Vitamin D (CITRACAL + D PO) Take 1 tablet by mouth daily.   Yes [provider]  Cholecalciferol (VITAMIN D) 1000 UNITS capsule Take 2,000 Units by mouth daily.  Yes [provider]  ELIQUIS 5 MG TABS tablet Take 1 tablet (5 mg total) by mouth 2 (two) times daily. 11/13/17  Yes Croitoru, Mihai, MD  lisinopril (PRINIVIL,ZESTRIL) 20 MG tablet Take 1 tablet (20 mg total) by mouth 2 (two) times daily. 01/11/18  Yes Croitoru, Mihai, MD  metoprolol tartrate (LOPRESSOR) 50 MG tablet Take 0.5 tablets (25 mg total) by mouth 2 (two) times daily. 02/01/18  Yes Eustaquio Maize, MD  Multiple Vitamins-Minerals (CENTRUM SILVER ULTRA WOMENS PO) Take 1 tablet by mouth daily.   Yes [provider]  nitroGLYCERIN (NITROSTAT) 0.4 MG SL tablet Place 1 tablet (0.4 mg total) under the tongue every 5 (five) minutes as needed for chest pain. 12/31/13  Yes Isaiah Serge, NP  pantoprazole (PROTONIX) 40 MG tablet Take 1 tablet (40 mg total) by mouth 2 (two) times daily. 01/17/18  Yes Croitoru, Mihai, MD  PARoxetine (PAXIL) 20 MG tablet TAKE (1) TABLET DAILY IN THE MORNING. 11/12/17  Yes Chipper Herb, MD  rosuvastatin (CRESTOR) 40 MG tablet Take 0.5 tablets (20 mg total) by mouth daily. 01/18/18  Yes Chipper Herb, MD  torsemide (DEMADEX) 20 MG tablet Take 20 mg by mouth daily as needed (edema).   Yes [provider]     Objective     Physical Exam: Vitals:   02/05/18 1330 02/05/18 1400 02/05/18 1430 02/05/18 1502  BP: (!) 99/58 100/73 112/72 (!) 97/54  Pulse: 72 70  70  Resp: 17 14 18 17   Temp:      TempSrc:      SpO2: 93% 93%  95%  Weight:      Height:        General: appears to be stated age; alert,  oriented Skin: warm, dry, no rash Head:  AT/[place] Mouth:  Oral mucosa membranes appear dry, normal dentition Neck: supple; trachea midline Heart:  irregular; did not appreciate any M/R/G Lungs: CTAB, did not appreciate any wheezes, rales, or rhonchi Abdomen: + BS; soft, ND; mild diffuse tenderness in the absence of any associated guarding, rigidity, or rebound tenderness. Extremities: no peripheral edema, no muscle wasting   Labs on Admission: I have personally reviewed following labs and imaging studies  CBC: Recent Labs  Lab 01/30/18 1221 02/05/18 1124  WBC 6.5 9.6  NEUTROABS 3.8  --   HGB 13.0 13.9  HCT 38.2 42.6  MCV 92 93.4  PLT 279 509   Basic Metabolic Panel: Recent Labs  Lab 02/05/18 1124  NA 130*  K 3.4*  CL 102  CO2 16*  GLUCOSE 229*  BUN 56*  CREATININE 2.64*  CALCIUM 10.1   GFR: Estimated Creatinine Clearance: 14.8 mL/min (A) (by C-G formula based on SCr of 2.64 mg/dL (H)). Liver Function Tests: Recent Labs  Lab 02/05/18 1124  AST 19  ALT 26  ALKPHOS 83  BILITOT 0.5  PROT 7.2  ALBUMIN 3.9   Recent Labs  Lab 02/05/18 1124  LIPASE 196*   No results for input(s): AMMONIA in the last 168 hours. Coagulation Profile: No results for input(s): INR, PROTIME in the last 168 hours. Cardiac Enzymes: No results for input(s): CKTOTAL, CKMB, CKMBINDEX, TROPONINI in the last 168 hours. BNP (last 3 results) No results for input(s): PROBNP in the last 8760 hours. HbA1C: No results for input(s): HGBA1C in the last 72 hours. CBG: No results for input(s): GLUCAP in the last 168 hours. Lipid Profile: No results for input(s): CHOL, HDL, LDLCALC, TRIG, CHOLHDL, LDLDIRECT in the last 72  hours. Thyroid Function Tests: No results for input(s): TSH, T4TOTAL, FREET4, T3FREE, THYROIDAB in the last 72 hours. Anemia Panel: No results for input(s): VITAMINB12, FOLATE, FERRITIN, TIBC, IRON, RETICCTPCT in the last 72 hours. Urine analysis:    Component Value Date/Time    COLORURINE YELLOW 02/09/2017 1049   APPEARANCEUR Cloudy (A) 11/13/2017 1043   LABSPEC 1.009 02/09/2017 1049   PHURINE 5.0 02/09/2017 1049   GLUCOSEU Negative 11/13/2017 1043   HGBUR MODERATE (A) 02/09/2017 1049   BILIRUBINUR Negative 11/13/2017 1043   KETONESUR NEGATIVE 02/09/2017 1049   PROTEINUR Negative 11/13/2017 1043   PROTEINUR 30 (A) 02/09/2017 1049   UROBILINOGEN negative 03/15/2015 1352   UROBILINOGEN 0.2 07/25/2011 2213   NITRITE Positive (A) 11/13/2017 1043   NITRITE NEGATIVE 02/09/2017 1049   LEUKOCYTESUR 3+ (A) 11/13/2017 1043    Radiological Exams on Admission: Ct Abdomen Pelvis Wo Contrast  Result Date: 02/05/2018 CLINICAL DATA:  Abdominal pain EXAM: CT ABDOMEN AND PELVIS WITHOUT CONTRAST TECHNIQUE: Multidetector CT imaging of the abdomen and pelvis was performed following the standard protocol without IV contrast. COMPARISON:  05/11/2017 FINDINGS: Lower chest: Transvenous pacing leads partially visualized. Scattered coronary calcifications. No pleural or pericardial effusion. Visualized lung bases clear. Hepatobiliary: No focal liver abnormality is seen. No gallstones, gallbladder wall thickening, or biliary dilatation. Pancreas: Unremarkable. No pancreatic ductal dilatation or surrounding inflammatory changes. Spleen: Normal in size without focal abnormality. Adrenals/Urinary Tract: Normal adrenals. Renal atrophy left worse than right, with scattered areas of calcifications/mineralization. No hydronephrosis. Stomach/Bowel: Stomach is physiologically distended. Small bowel decompressed. Normal appendix. The colon is nondilated. Moderate rectal distention by fecal material. Vascular/Lymphatic: Moderate aortoiliac atheromatous calcifications without aneurysm. No abdominal or pelvic adenopathy localized. Reproductive: Uterus and bilateral adnexa are unremarkable. Other: No ascites. No free air. Musculoskeletal: Lumbar levoscoliosis with multilevel spondylitic change. Chronic T12  compression deformity. No acute fracture or worrisome bone lesion. IMPRESSION: 1. No acute findings. 2. Nonobstructive bowel gas pattern. Moderate rectal distention by fecal material. 3. Coronary and aortoiliac atherosclerosis (ICD10-170.0). Electronically Signed   By: Lucrezia Europe M.D.   On: 02/05/2018 14:51   Dg Chest 1 View  Result Date: 02/05/2018 CLINICAL DATA:  Weakness, abdominal pain, nausea and diarrhea, history coronary artery disease post MI, stage III chronic kidney disease, clotting disorder, diabetes mellitus, hypertension EXAM: CHEST  1 VIEW COMPARISON:  05/19/2013 FINDINGS: LEFT subclavian ICD with leads projecting over RIGHT ventricle. Enlargement of cardiac silhouette. Atherosclerotic calcification aorta. Mediastinal contours and pulmonary vascularity normal. Lungs clear. No pulmonary infiltrate, pleural effusion or pneumothorax. Bones demineralized. IMPRESSION: Enlargement of cardiac silhouette post ICD. No acute abnormalities. Electronically Signed   By: Lavonia Dana M.D.   On: 02/05/2018 12:49     Assessment/Plan   [name] is a 85 y.o. female with medical history significant for chronic diarrhea, diarrhea predominant irritable bowel syndrome, stage III chronic kidney disease with baseline creatinine of 1.2-1.4, paroxysmal atrial fibrillation chronically anticoagulated on Eliquis, chronic diastolic heart failure, who is admitted to [facility] on 02/05/2018 with acute kidney injury superimposed on chronic kidney disease after presenting from home to the [facility] emergency department complaining of 3 to 4 days of increased frequency of diarrhea.     Principal Problem:   AKI (acute kidney injury) ([place]) Active Problems:   Generalized weakness   CKD (chronic kidney disease) stage 3, GFR 30-59 ml/min (HCC)   Paroxysmal atrial fibrillation, chads2 Vasc2 score of 5, on eliquis   Dehydration   Hypokalemia   Diarrhea    #)  Acute kidney injury: Relative to  stage III chronic kidney disease with baseline creatinine 1.2-1.4, with most recent prior creatinine data point of 1.37 on 12/31/2017, presenting labs reflect serum creatinine of 2.64.  This appears to be prerenal in nature on the basis of interval dehydration stemming from the patient's report of recent increase in frequency of chronic diarrhea coinciding with decline in oral intake due to decreased appetite over that time.  There may also be a pharmacologic factor, given that the patient believes that lisinopril was added to her outpatient antihypertensive regimen in the interval since the creatinine data point taken on 12/31/17.  Of note, presenting urinalysis was not associated with any urinary cast.  Plan: Gentle IV fluids with lactated Ringer's, with close attention for subsequent development of evidence of volume overload given the patient's history of chronic diastolic heart failure.  Monitor strict I's and O's.  Attempt avoid nephrotoxic agents.  Will repeat BMP in the morning.  Hold home lisinopril.  Check random urine sodium as well as random urine creatinine.     #) Diarrhea: In the context of patient's report of chronic diarrhea secondary to irritable bowel syndrome for which she reports following with outpatient gastroenterology, she notes a recent increase in the frequency of her episodes of diarrhea.  Not associated with any melena or hematochezia.  Exacerbation potentially on the basis of viral gastroenteritis versus progression of underlying irritable bowel syndrome.  No obvious recent modifications to outpatient medication regimen to explain recent increase in diarrhea.  Of note, CT of the abdomen/pelvis performed today demonstrated no evidence of acute intra-abdominal process.  The patient reports that she has not previously been on any irritable bowel syndrome specific medications, including not previously trying Bentyl.  C. difficile toxins a and B were found to be negative today.   Associated with baseline generalized abdominal discomfort in the absence of any recent worsening, with physical exam demonstrating no evidence of peritoneal signs.  Plan: N.p.o. for now.  Gentle IV fluids overnight.  Given negative C. difficile findings, and limited clinical evidence to suggest underlying infectious process, will order as needed Imodium.  Check stool lactoferrin.  Hold home Protonix.  Consider initiation of IBS specific medication.  Recommend increase fiber intake as an outpatient.  Check TSH.     #) Generalized weakness: Appears to coincide with relative dehydration in the setting of increased GI losses and diminished oral intake, as further described above.  Not associated with any acute focal neurologic deficits.  Plan: I have placed a consult for physical therapy evaluation in the morning.  Check TSH.     #) Hypokalemia: In the setting of increase in GI losses, as prescribed above, presenting labs notable for serum potassium of 3.4.  Plan: Potassium chloride 40 mEq IV over 4 hours x 1 now.  Will add on serum magnesium level labs are to collected in the ED.  Repeat BMP in the morning.  There will also be some provision of potassium via overnight maintenance lactated Ringer's, as above.      #) Hyperglycemia: Presenting CMP was notable for glucose of 229.  While there is some documentation of a history of diabetes, the patient denies any such diagnosis, and is not currently on any oral hypoglycemic agents nor any exogenous insulin as an outpatient.  Unknown check hyperglycemia could certainly be contributing to presenting dehydration via osmotic diuresis.  Plan: Check hemoglobin A1c.  Accu-Cheks q. before meals and at bedtime with associated sliding scale insulin.     #)  Paroxysmal atrial fibrillation: Chronically anticoagulated on Eliquis.  On Lopressor as an outpatient.  Appears to be in rate controlled atrial fibrillation at time of presentation.  Plan: Continue  home Lopressor.  Continue Eliquis, although dose reduction should be considered in the context of the patient's age and renal function.    #) Chronic diastolic heart failure: Most recent echocardiogram, which was performed in September 2019 showed LVEF 55 to 60%, no focal wall motion abnormalities, and grade 1 diastolic dysfunction.  No clinical evidence at time of presentation to suggest acutely onset heart failure.  Plan: Monitor strict I's and O's.  Close monitoring for development of volume overload as the patient will be receiving IV fluids in the setting of perceived presentation involving intravascular depletion as above.   DVT prophylaxis: on home Eliquis, although dose reduction may be warranted on the basis of patient's age and renal function. Code Status: DNR (per my discussions with the patient today). Family Communication: I discussed the patient's case with her son, who is present at bedside. Disposition Plan:  Per Rounding Team Consults called: ([name])  Admission status: observation; med-surg;     PLEASE NOTE THAT DRAGON DICTATION SOFTWARE WAS USED IN THE CONSTRUCTION OF THIS NOTE.   [facility] Pager [phone] From 3PM- 11PM.   Otherwise, please contact night-coverage  [email or web address] Password [facility]  02/05/2018, 3:48 PM

## 2018-02-05 NOTE — ED Notes (Signed)
Family giving first dose of oral contrast.

## 2018-02-06 ENCOUNTER — Ambulatory Visit: Payer: Medicare Other | Admitting: Internal Medicine

## 2018-02-06 DIAGNOSIS — I5032 Chronic diastolic (congestive) heart failure: Secondary | ICD-10-CM | POA: Diagnosis present

## 2018-02-06 DIAGNOSIS — Z833 Family history of diabetes mellitus: Secondary | ICD-10-CM | POA: Diagnosis not present

## 2018-02-06 DIAGNOSIS — M1990 Unspecified osteoarthritis, unspecified site: Secondary | ICD-10-CM | POA: Diagnosis present

## 2018-02-06 DIAGNOSIS — N183 Chronic kidney disease, stage 3 (moderate): Secondary | ICD-10-CM | POA: Diagnosis present

## 2018-02-06 DIAGNOSIS — I130 Hypertensive heart and chronic kidney disease with heart failure and stage 1 through stage 4 chronic kidney disease, or unspecified chronic kidney disease: Secondary | ICD-10-CM | POA: Diagnosis present

## 2018-02-06 DIAGNOSIS — Z87440 Personal history of urinary (tract) infections: Secondary | ICD-10-CM | POA: Diagnosis not present

## 2018-02-06 DIAGNOSIS — E860 Dehydration: Secondary | ICD-10-CM | POA: Diagnosis present

## 2018-02-06 DIAGNOSIS — N179 Acute kidney failure, unspecified: Secondary | ICD-10-CM | POA: Diagnosis present

## 2018-02-06 DIAGNOSIS — I480 Paroxysmal atrial fibrillation: Secondary | ICD-10-CM | POA: Diagnosis present

## 2018-02-06 DIAGNOSIS — F329 Major depressive disorder, single episode, unspecified: Secondary | ICD-10-CM | POA: Diagnosis present

## 2018-02-06 DIAGNOSIS — R197 Diarrhea, unspecified: Secondary | ICD-10-CM | POA: Diagnosis present

## 2018-02-06 DIAGNOSIS — Z95810 Presence of automatic (implantable) cardiac defibrillator: Secondary | ICD-10-CM | POA: Diagnosis not present

## 2018-02-06 DIAGNOSIS — Z66 Do not resuscitate: Secondary | ICD-10-CM | POA: Diagnosis present

## 2018-02-06 DIAGNOSIS — R531 Weakness: Secondary | ICD-10-CM | POA: Diagnosis not present

## 2018-02-06 DIAGNOSIS — K219 Gastro-esophageal reflux disease without esophagitis: Secondary | ICD-10-CM

## 2018-02-06 DIAGNOSIS — Z8249 Family history of ischemic heart disease and other diseases of the circulatory system: Secondary | ICD-10-CM | POA: Diagnosis not present

## 2018-02-06 DIAGNOSIS — E876 Hypokalemia: Secondary | ICD-10-CM | POA: Diagnosis present

## 2018-02-06 DIAGNOSIS — F419 Anxiety disorder, unspecified: Secondary | ICD-10-CM | POA: Diagnosis present

## 2018-02-06 DIAGNOSIS — E785 Hyperlipidemia, unspecified: Secondary | ICD-10-CM | POA: Diagnosis present

## 2018-02-06 DIAGNOSIS — H3530 Unspecified macular degeneration: Secondary | ICD-10-CM | POA: Diagnosis present

## 2018-02-06 DIAGNOSIS — Z955 Presence of coronary angioplasty implant and graft: Secondary | ICD-10-CM | POA: Diagnosis not present

## 2018-02-06 DIAGNOSIS — I2510 Atherosclerotic heart disease of native coronary artery without angina pectoris: Secondary | ICD-10-CM | POA: Diagnosis present

## 2018-02-06 DIAGNOSIS — E119 Type 2 diabetes mellitus without complications: Secondary | ICD-10-CM | POA: Diagnosis present

## 2018-02-06 DIAGNOSIS — I252 Old myocardial infarction: Secondary | ICD-10-CM | POA: Diagnosis not present

## 2018-02-06 DIAGNOSIS — K580 Irritable bowel syndrome with diarrhea: Secondary | ICD-10-CM | POA: Diagnosis present

## 2018-02-06 DIAGNOSIS — K449 Diaphragmatic hernia without obstruction or gangrene: Secondary | ICD-10-CM | POA: Diagnosis present

## 2018-02-06 LAB — COMPREHENSIVE METABOLIC PANEL
ALK PHOS: 65 U/L (ref 38–126)
ALT: 16 U/L (ref 0–44)
ANION GAP: 9 (ref 5–15)
AST: 14 U/L — ABNORMAL LOW (ref 15–41)
Albumin: 3.2 g/dL — ABNORMAL LOW (ref 3.5–5.0)
BUN: 48 mg/dL — ABNORMAL HIGH (ref 8–23)
CALCIUM: 8.7 mg/dL — AB (ref 8.9–10.3)
CO2: 13 mmol/L — ABNORMAL LOW (ref 22–32)
Chloride: 110 mmol/L (ref 98–111)
Creatinine, Ser: 2.22 mg/dL — ABNORMAL HIGH (ref 0.44–1.00)
GFR calc Af Amer: 23 mL/min — ABNORMAL LOW (ref >60)
GFR calc non Af Amer: 20 mL/min — ABNORMAL LOW (ref >60)
Glucose, Bld: 112 mg/dL — ABNORMAL HIGH (ref 70–99)
Potassium: 3.4 mmol/L — ABNORMAL LOW (ref 3.5–5.1)
Sodium: 132 mmol/L — ABNORMAL LOW (ref 135–145)
Total Bilirubin: 0.5 mg/dL (ref 0.3–1.2)
Total Protein: 5.8 g/dL — ABNORMAL LOW (ref 6.5–8.1)

## 2018-02-06 LAB — LIPASE, BLOOD: Lipase: 100 U/L — ABNORMAL HIGH (ref 11–51)

## 2018-02-06 LAB — HEMOGLOBIN A1C
Hgb A1c MFr Bld: 7.1 % — ABNORMAL HIGH (ref 4.8–5.6)
MEAN PLASMA GLUCOSE: 157.07 mg/dL

## 2018-02-06 LAB — GLUCOSE, CAPILLARY: Glucose-Capillary: 132 mg/dL — ABNORMAL HIGH (ref 70–99)

## 2018-02-06 LAB — MAGNESIUM: Magnesium: 1.9 mg/dL (ref 1.7–2.4)

## 2018-02-06 LAB — CBC
HCT: 40.3 % (ref 36.0–46.0)
Hemoglobin: 12.8 g/dL (ref 12.0–15.0)
MCH: 30.8 pg (ref 26.0–34.0)
MCHC: 31.8 g/dL (ref 30.0–36.0)
MCV: 97.1 fL (ref 80.0–100.0)
PLATELETS: 215 10*3/uL (ref 150–400)
RBC: 4.15 MIL/uL (ref 3.87–5.11)
RDW: 13.4 % (ref 11.5–15.5)
WBC: 7.6 10*3/uL (ref 4.0–10.5)
nRBC: 0 % (ref 0.0–0.2)

## 2018-02-06 LAB — CREATININE, URINE, RANDOM: Creatinine, Urine: 54.22 mg/dL

## 2018-02-06 LAB — OSMOLALITY, URINE: Osmolality, Ur: 186 mOsm/kg — ABNORMAL LOW (ref 300–900)

## 2018-02-06 LAB — SODIUM, URINE, RANDOM: Sodium, Ur: <10 mmol/L

## 2018-02-06 LAB — TSH: TSH: 3.439 u[IU]/mL (ref 0.350–4.500)

## 2018-02-06 MED ORDER — FAMOTIDINE 20 MG PO TABS
20.0000 mg | ORAL_TABLET | Freq: Every day | ORAL | Status: DC
  Administered 2018-02-06: 20 mg via ORAL
  Filled 2018-02-06: qty 1

## 2018-02-06 MED ORDER — PANTOPRAZOLE SODIUM 40 MG PO TBEC
40.0000 mg | DELAYED_RELEASE_TABLET | Freq: Every day | ORAL | Status: DC
  Administered 2018-02-07: 40 mg via ORAL
  Filled 2018-02-06: qty 1

## 2018-02-06 MED ORDER — APIXABAN 2.5 MG PO TABS
2.5000 mg | ORAL_TABLET | Freq: Two times a day (BID) | ORAL | Status: DC
  Administered 2018-02-06 – 2018-02-07 (×2): 2.5 mg via ORAL
  Filled 2018-02-06 (×2): qty 1

## 2018-02-06 NOTE — Progress Notes (Signed)
Initial Nutrition Assessment  DOCUMENTATION CODES:      INTERVENTION:  Snacks between meals as desired   Heart Healthy / CHO modified diet- recommend soft foods   NUTRITION DIAGNOSIS:   Inadequate oral intake related to acute illness(IBS flare-) as evidenced by per patient/family report, energy intake < 75% for > 7 days.   GOAL:   Patient will meet greater than or equal to 90% of their needs  MONITOR:   PO intake, Weight trends, Labs  REASON FOR ASSESSMENT:   Malnutrition Screening Tool, Consult Assessment of nutrition requirement/status (optimize nutrition intake)  ASSESSMENT: Patient is an 85 yo female with hx of CKD-3, GERD, CAD-has (ICD), chronic diarrhea and HTN. Presents with increase episodes of diarrhea and acute on chronic CKD. CT of Abdomen and Pelvis- no acute findings.  Usual meal intake: Patient says her appetite has been decreased with her increase in loose stools. She also complains of food feeling stuck in her throat. Upper endoscopy performed 07/18/16 no problems identified at that time. Currently she is eating 65-75% of meals. Able to feed herself.   Weight history review: 69-74 kg range the past year. Weight is down 3.2% compared to 1 month ago which is not clinically significant. Unable to diagnosis malnutrition at this time.  Medications reviewed and include: SSI, lopressor, Crestor  IVF- lactated ringers @ 100 ml/hr  Labs: BMP Latest Ref Rng & Units 02/07/2018 02/06/2018 02/05/2018  Glucose 70 - 99 mg/dL 142(H) 112(H) 229(H)  BUN 8 - 23 mg/dL 49(H) 48(H) 56(H)  Creatinine 0.44 - 1.00 mg/dL 1.91(H) 2.22(H) 2.64(H)  BUN/Creat Ratio 12 - 28 - - -  Sodium 135 - 145 mmol/L 135 132(L) 130(L)  Potassium 3.5 - 5.1 mmol/L 3.7 3.4(L) 3.4(L)  Chloride 98 - 111 mmol/L 115(H) 110 102  CO2 22 - 32 mmol/L 14(L) 13(L) 16(L)  Calcium 8.9 - 10.3 mg/dL 8.7(L) 8.7(L) 10.1     NUTRITION - FOCUSED PHYSICAL EXAM: no acute changes identified   Diet Order:   Diet  Order            Diet - low sodium heart healthy        Diet heart healthy/carb modified Room service appropriate? Yes; Fluid consistency: Thin  Diet effective now              EDUCATION NEEDS: encouraged general healthful diet with consistent meal intake. Talked about oral nutrition supplements to bridge intake on days when she is not eating well.   Skin:  Skin Assessment: Reviewed RN Assessment  Last BM:  1/23 type 6 -medium  Height:   Ht Readings from Last 1 Encounters:  02/05/18 5\' 3"  (1.6 m)    Weight:   Wt Readings from Last 1 Encounters:  02/07/18 72.7 kg    Ideal Body Weight:  52 kg  BMI:  Body mass index is 28.39 kg/m.  Estimated Nutritional Needs:   Kcal:  2449-7530 (21-23 kcal/kg/bw)  Protein:  51-58 gr (0.7-0.8 gr/kg/bw)  Fluid:  >1500 ml daily   Colman Cater MS,RD,CSG,LDN Office: [phone] Pager: [phone]

## 2018-02-06 NOTE — Progress Notes (Addendum)
PROGRESS NOTE    [name]  GYI:[id number] DOB: [birth year] DOA: 02/05/2018 PCP: Chipper Herb, MD     Brief Narrative:  85 y.o. female with medical history significant for chronic diarrhea, diarrhea predominant irritable bowel syndrome, stage III chronic kidney disease with baseline creatinine of 1.2-1.4, paroxysmal atrial fibrillation chronically anticoagulated on Eliquis, chronic diastolic heart failure, who is admitted to [facility] on 02/05/2018 with acute kidney injury superimposed on chronic kidney disease after presenting from home to the [facility] emergency department complaining of 3 to 4 days of increased frequency of diarrhea.   The patient reports a history of chronic diarrhea in the context of a working diagnosis of diarrhea predominant irritable bowel syndrome per outpatient gastroenterology.  She reports that this is been associated with 3-5 daily episodes of loose stool for at least the last 6 months.  However, relative to this baseline, the patient reports an increase in the frequency of her episodes of loose stool over the preceding 3 to 4 days, noting 10-12 episodes of loose stool over that time in the absence of any associated melena or hematochezia.  Denies any associated subjective fever, chills, rigors, or generalized myalgias.  Reports associated nausea in the absence of any vomiting. denies any recent travel or known sick contacts.    Found dehydrated, with acute on chronic renal failure and with hypokalemia.   Assessment & Plan: 1-acute on chronic renal failure: stage 3 at baseline -due to pre-renal azotemia from GI loses and dehydration -continue IVF's -advance diet -minimize nephrotoxic agents -follow renal function trend  2-diarrhea/IBS flare -C. Diff neg -will treat symptomatically with the use of imodium  -continue IVF"s  3-hypokalemia -Mg stable -will replete as needed -follow BMET in am -control diarrhea  4-generalized  weakness -seen by PT and will arrange HHPT at discharge -associated with dehydration and electrolytes abnormalities  5-chronic PAF  -rate controlled -continue eliquis -CHADsVASC score 5 -continue metoprolol  6-HLD -continue statins  7-anxiety/depression -continue Paxil.  8-GERD/Hiatal hernia -will restart PPI daily and pepcid QHS.   DVT prophylaxis: Apixaban  Code Status: DNR Family Communication: Niece at bedside. Disposition Plan: hopefully home in the next 24-48 hours; continue IVF's, follow renal function trend and electrolytes. Start PPI and QHS pepcid.  Consultants:   None   Procedures:   See below for x-ray reports.  Antimicrobials:  Anti-infectives (From admission, onward)   None       Subjective: Reporting decrease appetite, ongoing reflux symptoms and diarrhea. Patient feeling weak.  Objective: Vitals:   02/06/18 0605 02/06/18 1451 02/06/18 1900 02/06/18 2129  BP: (!) 101/57 91/60 (!) 95/59 106/68  Pulse: (!) 51 73  70  Resp:  16  16  Temp: 97.7 F (36.5 C) 97.6 F (36.4 C)  97.7 F (36.5 C)  TempSrc: Oral Oral  Oral  SpO2: 97% 100%  (!) 75%  Weight:      Height:        Intake/Output Summary (Last 24 hours) at 02/06/2018 2203 Last data filed at 02/06/2018 1257 Gross per 24 hour  Intake 1513.26 ml  Output 250 ml  Net 1263.26 ml   Filed Weights   02/05/18 1046 02/06/18 0500  Weight: 68.9 kg 70.3 kg    Examination: General exam: Alert, awake, oriented x 3, no CP, no SOB. Still having diarrhea and with decrease oral intake. Respiratory system: Clear to auscultation. Respiratory effort normal. Cardiovascular system:RRR. No murmurs, rubs, gallops. Gastrointestinal system: Abdomen is nondistended, soft and nontender.  No organomegaly or masses felt. Normal bowel sounds heard. Central nervous system: Alert and oriented. No focal neurological deficits. Extremities: No C/C/E, +pedal pulses Skin: No rashes, lesions or ulcers Psychiatry:  Judgement and insight appear normal. Mood & affect appropriate.     Data Reviewed: I have personally reviewed following labs and imaging studies  CBC: Recent Labs  Lab 02/05/18 1124 02/06/18 0446  WBC 9.6 7.6  HGB 13.9 12.8  HCT 42.6 40.3  MCV 93.4 97.1  PLT 264 127   Basic Metabolic Panel: Recent Labs  Lab 02/05/18 1124 02/06/18 0446  NA 130* 132*  K 3.4* 3.4*  CL 102 110  CO2 16* 13*  GLUCOSE 229* 112*  BUN 56* 48*  CREATININE 2.64* 2.22*  CALCIUM 10.1 8.7*  MG 2.2 1.9   GFR: Estimated Creatinine Clearance: 17.7 mL/min (A) (by C-G formula based on SCr of 2.22 mg/dL (H)).   Liver Function Tests: Recent Labs  Lab 02/05/18 1124 02/06/18 0446  AST 19 14*  ALT 26 16  ALKPHOS 83 65  BILITOT 0.5 0.5  PROT 7.2 5.8*  ALBUMIN 3.9 3.2*   Recent Labs  Lab 02/05/18 1124 02/06/18 0446  LIPASE 196* 100*   HbA1C: Recent Labs    02/05/18 1124  HGBA1C 7.1*   CBG: Recent Labs  Lab 02/05/18 1838 02/06/18 1108  GLUCAP 132* 132*   Thyroid Function Tests: Recent Labs    02/05/18 1124  TSH 3.439   Urine analysis:    Component Value Date/Time   COLORURINE AMBER (A) 02/05/2018 1048   APPEARANCEUR CLEAR 02/05/2018 1048   APPEARANCEUR Cloudy (A) 11/13/2017 1043   LABSPEC 1.009 02/05/2018 1048   PHURINE 5.0 02/05/2018 1048   GLUCOSEU NEGATIVE 02/05/2018 1048   HGBUR SMALL (A) 02/05/2018 1048   BILIRUBINUR NEGATIVE 02/05/2018 1048   BILIRUBINUR Negative 11/13/2017 1043   KETONESUR NEGATIVE 02/05/2018 1048   PROTEINUR NEGATIVE 02/05/2018 1048   UROBILINOGEN negative 03/15/2015 1352   UROBILINOGEN 0.2 07/25/2011 2213   NITRITE NEGATIVE 02/05/2018 1048   LEUKOCYTESUR NEGATIVE 02/05/2018 1048   LEUKOCYTESUR 3+ (A) 11/13/2017 1043    Recent Results (from the past 240 hour(s))  C difficile quick scan w PCR reflex     Status: None   Collection Time: 02/05/18 12:19 PM  Result Value Ref Range Status   C Diff antigen NEGATIVE NEGATIVE Final   C Diff toxin  NEGATIVE NEGATIVE Final   C Diff interpretation No C. difficile detected.  Final    Comment: Performed at [facility], [address] [place], [place] [address]     Radiology Studies: Ct Abdomen Pelvis Wo Contrast  Result Date: 02/05/2018 CLINICAL DATA:  Abdominal pain EXAM: CT ABDOMEN AND PELVIS WITHOUT CONTRAST TECHNIQUE: Multidetector CT imaging of the abdomen and pelvis was performed following the standard protocol without IV contrast. COMPARISON:  05/11/2017 FINDINGS: Lower chest: Transvenous pacing leads partially visualized. Scattered coronary calcifications. No pleural or pericardial effusion. Visualized lung bases clear. Hepatobiliary: No focal liver abnormality is seen. No gallstones, gallbladder wall thickening, or biliary dilatation. Pancreas: Unremarkable. No pancreatic ductal dilatation or surrounding inflammatory changes. Spleen: Normal in size without focal abnormality. Adrenals/Urinary Tract: Normal adrenals. Renal atrophy left worse than right, with scattered areas of calcifications/mineralization. No hydronephrosis. Stomach/Bowel: Stomach is physiologically distended. Small bowel decompressed. Normal appendix. The colon is nondilated. Moderate rectal distention by fecal material. Vascular/Lymphatic: Moderate aortoiliac atheromatous calcifications without aneurysm. No abdominal or pelvic adenopathy localized. Reproductive: Uterus and bilateral adnexa are unremarkable. Other: No ascites. No free  air. Musculoskeletal: Lumbar levoscoliosis with multilevel spondylitic change. Chronic T12 compression deformity. No acute fracture or worrisome bone lesion. IMPRESSION: 1. No acute findings. 2. Nonobstructive bowel gas pattern. Moderate rectal distention by fecal material. 3. Coronary and aortoiliac atherosclerosis (ICD10-170.0). Electronically Signed   By: Lucrezia Europe M.D.   On: 02/05/2018 14:51   Dg Chest 1 View  Result Date: 02/05/2018 CLINICAL DATA:  Weakness, abdominal pain, nausea and  diarrhea, history coronary artery disease post MI, stage III chronic kidney disease, clotting disorder, diabetes mellitus, hypertension EXAM: CHEST  1 VIEW COMPARISON:  05/19/2013 FINDINGS: LEFT subclavian ICD with leads projecting over RIGHT ventricle. Enlargement of cardiac silhouette. Atherosclerotic calcification aorta. Mediastinal contours and pulmonary vascularity normal. Lungs clear. No pulmonary infiltrate, pleural effusion or pneumothorax. Bones demineralized. IMPRESSION: Enlargement of cardiac silhouette post ICD. No acute abnormalities. Electronically Signed   By: Lavonia Dana M.D.   On: 02/05/2018 12:49    Scheduled Meds: . apixaban  2.5 mg Oral BID  . insulin aspart  0-9 Units Subcutaneous Q6H  . metoprolol tartrate  25 mg Oral BID  . PARoxetine  20 mg Oral Daily  . rosuvastatin  20 mg Oral q1800   Continuous Infusions: . lactated ringers 100 mL/hr at 02/06/18 0329     LOS: 0 days    Time spent: 30 minutes    Barton Dubois, MD [facility] Pager [phone]  If 7PM-7AM, please contact night-coverage [email or web address] Password [facility] 02/06/2018, 10:03 PM

## 2018-02-06 NOTE — Care Management Note (Signed)
Case Management Note  Patient Details  Name: LAVINIA MCNEELY MRN: [id number] Date of Birth: [birth year]  Subjective/Objective:      AKI. From home alone. Family lives next door. She walks with a cane in public only.  Family drives her to appointments. She has PCP and no issues affording/obtaining medication.           Recommended for home health PT. Discussed with patient, she declines.  She feels this is not needing as her family is close by and she get around fine.      Action/Plan: DC home.   Expected Discharge Date:    unk              Expected Discharge Plan:  Home/Self Care  In-House Referral:     Discharge planning Services  CM Consult  Post Acute Care Choice:  Home Health Choice offered to:  Patient  DME Arranged:    DME Agency:     HH Arranged:  Patient Refused [place] Agency:     Status of Service:  Completed, signed off  If discussed at [employer] of Stay Meetings, dates discussed:    Additional Comments:  Aivy Akter, Chauncey Reading, RN 02/06/2018, 3:59 PM

## 2018-02-06 NOTE — Plan of Care (Signed)
  Problem: Acute Rehab PT Goals(only PT should resolve) Goal: Patient Will Transfer Sit To/From Stand Outcome: Progressing Flowsheets (Taken 02/06/2018 1035) Patient will transfer sit to/from stand: with modified independence Goal: Pt Will Transfer Bed To Chair/Chair To Bed Outcome: Progressing Flowsheets (Taken 02/06/2018 1035) Pt will Transfer Bed to Chair/Chair to Bed: with modified independence Goal: Pt Will Ambulate Outcome: Progressing Flowsheets (Taken 02/06/2018 1035) Pt will Ambulate: 100 feet; with supervision; with rolling walker   10:36 AM, 02/06/18 Talbot Grumbling, DPT Physical Therapist with [facility] [phone] office

## 2018-02-06 NOTE — Evaluation (Signed)
Physical Therapy Evaluation Patient Details Name: [name] MRN: [id number] DOB: [birth year] Today's Date: 02/06/2018   History of Present Illness  [name] is a 85 y.o. female with medical history significant for chronic diarrhea, diarrhea predominant irritable bowel syndrome, stage III chronic kidney disease with baseline creatinine of 1.2-1.4, paroxysmal atrial fibrillation chronically anticoagulated on Eliquis, chronic diastolic heart failure, who is admitted to [facility] on 02/05/2018 with acute kidney injury superimposed on chronic kidney disease after presenting from home to the [facility] emergency department complaining of 3 to 4 days of increased frequency of diarrhea.     Clinical Impression  Patient presents supine in bed with niece at bedside, pleasant and is agreeable to therapy. Patient near baseline for functional mobility and gait, slightly limited secondary to generalized weakness and fatigue with activity. Patient is modified independent for supine to/from sit transfers demonstrating slow movement requiring increased time to complete due to old vertebral fracture pain per patient report. Patient requires min guard assist for sit to/from stand transfers and stand pivot transfers using RW requiring increased time and demonstrating initial unsteadiness upon standing, improving with time and multiple reps. Patient ambulates 25 feet with RW and min guard assist demonstrating decreased cadence, slow and cautious step progression, and reliance on RW for balance with no shortness of breath noted. Patient and niece report multiple family members available to assist patient with needs at home at varying hours throughout day, provide cooking/cleaning assistance, and laundry. Patient left up in chair with bil lower extremities elevated, niece at bedside, and call bell in lap. Patient will benefit from continued physical therapy in hospital and recommended venue below  to increase strength, balance, endurance for safe ADLs and gait.     Follow Up Recommendations Home health PT;Supervision - Intermittent    Equipment Recommendations  None recommended by PT    Recommendations for Other Services       Precautions / Restrictions Precautions Precautions: Fall Restrictions Weight Bearing Restrictions: No      Mobility  Bed Mobility Overal bed mobility: Modified Independent             General bed mobility comments: increased time to complete  Transfers Overall transfer level: Needs assistance Equipment used: Rolling walker (2 wheeled) Transfers: Sit to/from [facility] Sit to Stand: Min guard Stand pivot transfers: Min guard       General transfer comment: increased time, verbal cues for hand placement, denies dizziness  Ambulation/Gait Ambulation/Gait assistance: Min guard Gait Distance (Feet): 25 Feet Assistive device: Rolling walker (2 wheeled) Gait Pattern/deviations: Decreased step length - right;Decreased step length - left;Decreased stride length Gait velocity: decreased   General Gait Details: flat foot posture with cautious step progression throughout gait cycle, no loss of balance, limited by fatigue and generalized weakness  Stairs            Wheelchair Mobility    Modified Rankin (Stroke Patients Only)       Balance Overall balance assessment: Needs assistance Sitting-balance support: Feet supported;No upper extremity supported Sitting balance-Leahy Scale: Good Sitting balance - Comments: seated edge of bed   Standing balance support: During functional activity;Bilateral upper extremity supported Standing balance-Leahy Scale: Fair Standing balance comment: with RW                             Pertinent Vitals/Pain Pain Assessment: 0-10 Pain Score: 5  Pain Location: stomach Pain Descriptors /  Indicators: Aching;Cramping Pain Intervention(s): Limited activity within  patient's tolerance;Monitored during session    [place] expects to be discharged to:: Private residence Living Arrangements: Alone Available Help at Discharge: Family;Available PRN/intermittently Type of Home: House Home Access: Stairs to enter Entrance Stairs-Rails: None Entrance Stairs-Number of Steps: 1 Home Layout: One level;Laundry or work area in basement(Patient reports daughter does her Medical sales representative) Home Equipment: Environmental consultant - 2 wheels;Walker - 4 wheels;Cane - single point;Bedside commode;Shower seat Additional Comments: Patient reports her daughter lives next door and does her laundry, 2 sons along with niece and multiple other relatives live in neighborhood and check on patient. Patient reports her family provides her transportation into the community. Patient reports her children work during the day, but cook her meals and bring them to her in the evening and check on her daily. Patient reports her niece assists her the most because she is retired and is available more than other faimly members.    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        Extremity/Trunk Assessment   Upper Extremity Assessment Upper Extremity Assessment: Overall WFL for tasks assessed    Lower Extremity Assessment Lower Extremity Assessment: Generalized weakness    Cervical / Trunk Assessment Cervical / Trunk Assessment: Normal  Communication   Communication: No difficulties  Cognition Arousal/Alertness: Awake/alert Behavior During Therapy: WFL for tasks assessed/performed Overall Cognitive Status: Within Functional Limits for tasks assessed                                        General Comments       Exercises     Assessment/Plan    PT Assessment Patient needs continued PT services  PT Problem List Decreased strength;Decreased activity tolerance;Decreased balance;Decreased mobility       PT Treatment Interventions Gait training;Functional mobility training;Therapeutic activities;Therapeutic exercise;Patient/family education    PT Goals (Current goals can be found in the Care Plan section)  Acute Rehab PT Goals Patient Stated Goal: home with family support PT Goal Formulation: With patient/family Time For Goal Achievement: 02/13/18 Potential to Achieve Goals: Good    Frequency Min 2X/week   Barriers to discharge        Co-evaluation               AM-PAC PT "6 Clicks" Mobility  Outcome Measure Help needed turning from your back to your side while in a flat bed without using bedrails?: None Help needed moving from lying on your back to sitting on the side of a flat bed without using bedrails?: None Help needed moving to and from a bed to a chair (including a wheelchair)?: A Little Help needed standing up from a chair using your arms (e.g., wheelchair or bedside chair)?: A Little Help needed to walk in hospital room?: A Little Help needed climbing 3-5 steps with a railing? : A Lot 6 Click Score: 19    End of Session   Activity Tolerance: Patient tolerated treatment well;No increased pain;Patient limited by fatigue Patient left: in chair;with call bell/phone within reach;with family/visitor present(Niece at bedside) Nurse Communication: Mobility status PT Visit Diagnosis: Unsteadiness on feet (  R26.81);Other abnormalities of gait and mobility (R26.89);Muscle weakness (generalized) (M62.81)    Time: 4276-7011 PT Time Calculation (min) (ACUTE ONLY): 27 min   Charges:   PT Evaluation $PT Eval Moderate Complexity: 1 Mod PT Treatments $Gait Training: 8-22 mins        10:35 AM, 02/06/18 Talbot Grumbling, DPT Physical Therapist with [facility] [phone] office

## 2018-02-06 NOTE — Progress Notes (Signed)
BS 95 @ 0000, Did not cross over in Epic.

## 2018-02-07 DIAGNOSIS — K219 Gastro-esophageal reflux disease without esophagitis: Secondary | ICD-10-CM

## 2018-02-07 LAB — BASIC METABOLIC PANEL
Anion gap: 6 (ref 5–15)
BUN: 49 mg/dL — AB (ref 8–23)
CO2: 14 mmol/L — ABNORMAL LOW (ref 22–32)
CREATININE: 1.91 mg/dL — AB (ref 0.44–1.00)
Calcium: 8.7 mg/dL — ABNORMAL LOW (ref 8.9–10.3)
Chloride: 115 mmol/L — ABNORMAL HIGH (ref 98–111)
GFR calc non Af Amer: 24 mL/min — ABNORMAL LOW (ref >60)
GFR, EST AFRICAN AMERICAN: 27 mL/min — AB (ref >60)
Glucose, Bld: 142 mg/dL — ABNORMAL HIGH (ref 70–99)
Potassium: 3.7 mmol/L (ref 3.5–5.1)
SODIUM: 135 mmol/L (ref 135–145)

## 2018-02-07 LAB — LACTOFERRIN, FECAL, QUALITATIVE: Lactoferrin, Fecal, Qual: NEGATIVE

## 2018-02-07 LAB — GLUCOSE, CAPILLARY: Glucose-Capillary: 129 mg/dL — ABNORMAL HIGH (ref 70–99)

## 2018-02-07 MED ORDER — PANTOPRAZOLE SODIUM 40 MG PO TBEC: 40.0000 mg | DELAYED_RELEASE_TABLET | Freq: Every day | ORAL | 3 refills | Status: DC

## 2018-02-07 MED ORDER — FAMOTIDINE 20 MG PO TABS: 20.0000 mg | ORAL_TABLET | Freq: Every day | ORAL | 3 refills | Status: DC

## 2018-02-07 MED ORDER — METOPROLOL TARTRATE 50 MG PO TABS: 25.0000 mg | ORAL_TABLET | Freq: Two times a day (BID) | ORAL | Status: DC

## 2018-02-07 MED ORDER — DIPHENOXYLATE-ATROPINE 2.5-0.025 MG PO TABS: 1.0000 | ORAL_TABLET | Freq: Three times a day (TID) | ORAL | 1 refills | Status: DC | PRN

## 2018-02-07 MED ORDER — LISINOPRIL 20 MG PO TABS: 20.0000 mg | ORAL_TABLET | Freq: Two times a day (BID) | ORAL | Status: DC

## 2018-02-07 NOTE — Progress Notes (Signed)
Patient and family state understanding of discharge instructions 

## 2018-02-07 NOTE — Discharge Summary (Signed)
Physician Discharge Summary  [name] TDD:[id number] DOB: 02-13-1931 DOA: 02/05/2018  PCP: Chipper Herb, MD  Admit date: 02/05/2018 Discharge date: 02/07/2018  Time spent: 35 minutes  Recommendations for Outpatient Follow-up:  1. Reassess blood pressure and further adjust antihypertensive regimen as needed 2. Repeat basic metabolic panel to follow electrolytes and renal function 3. If renal function back to baseline resume the use of ACE inhibitors 4. Follow resolution of patient's symptoms (reflux/sensation food was getting stuck in the middle of her chest); if still an ongoing issue for the patient she will need referral to see her primary gastroenterology for further evaluation and management.   Discharge Diagnoses:  Principal Problem:   AKI (acute kidney injury) ([place]) Active Problems:   Weakness   CKD (chronic kidney disease) stage 3, GFR 30-59 ml/min (HCC)   Paroxysmal atrial fibrillation, chads2 Vasc2 score of 5, on eliquis   Dehydration   Hypokalemia   Diarrhea   Gastroesophageal reflux disease   Discharge Condition: Stable and improved.  Patient discharged home with instruction to follow-up with PCP in 10 days.  Diet recommendation: Heart healthy diet.  Filed Weights   02/05/18 1046 02/06/18 0500 02/07/18 0517  Weight: 68.9 kg 70.3 kg 72.7 kg    History of present illness:  As per H&P written by Dr. Babs Bertin on 02/05/2018 85 y.o. female with medical history significant for chronic diarrhea, diarrhea predominant irritable bowel syndrome, stage III chronic kidney disease with baseline creatinine of 1.2-1.4, paroxysmal atrial fibrillation chronically anticoagulated on Eliquis, chronic diastolic heart failure, who is admitted to [facility] on 02/05/2018 with acute kidney injury superimposed on chronic kidney disease after presenting from home to the [facility] emergency department complaining of 3 to 4 days of increased frequency of diarrhea.    The patient reports a history of chronic diarrhea in the context of a working diagnosis of diarrhea predominant irritable bowel syndrome per outpatient gastroenterology.  She reports that this is been associated with 3-5 daily episodes of loose stool for at least the last 6 months.  However, relative to this baseline, the patient reports an increase in the frequency of her episodes of loose stool over the preceding 3 to 4 days, noting 10-12 episodes of loose stool over that time in the absence of any associated melena or hematochezia.  Denies any associated subjective fever, chills, rigors, or generalized myalgias.  Reports associated nausea in the absence of any vomiting. denies any recent travel or known sick contacts.  She notes chronic, generalized abdominal discomfort for at least the last 6 months, without any recent worsening thereof.  No recent trauma. The patient reports that her most recent antibiotic use occurred approximately 1 month ago, at which time she completed a 5-day course of ciprofloxacin for urinary tract infection.  Denies any subsequent antibiotic use.  She believes that her most recent routine screening colonoscopy occurred approximately 3 years ago.  In terms of outpatient medications that can be associated with causing diarrhea, the patient reports that she has been on the same dose/frequency of Protonix for several years now, without any recent modifications to this medication.   Aside from aforementioned ciprofloxacin use 1 month ago, the patient reports that the only other recent modification to her outpatient medication regimen is the addition of lisinopril approximately 3 to 4 weeks ago.  Hospital Course:  1-acute on chronic renal failure: stage 3 at baseline -due to pre-renal azotemia from GI loses and dehydration -Advised to keep herself well-hydrated (  especially water intake). -Diet advanced and well-tolerated -Continue minimizing nephrotoxic agents -follow renal  function trend at follow-up visit  2-diarrhea/IBS flare -C. Diff neg -will treat symptomatically with the use of Lomtil -Advised to keep yourself well-hydrated -No further fluid resuscitation given acutely to prevent the chance of diastolic heart failure exacerbation.  3-hypokalemia -Mg stable -Continue repletion as needed -follow BMET at follow-up visit. -Have better control of diarrhea  4-generalized weakness -seen by PT and will arrange HHPT at discharge -Patient's weakness most likely associated with dehydration and electrolytes abnormalities  5-chronic PAF  -rate controlled -continue eliquis -CHADsVASC score 5 -continue metoprolol  6-HLD -continue statins  7-anxiety/depression -continue Paxil.  8-GERD/Hiatal hernia -will discharge on PPI daily and pepcid QHS.  Procedures:  See below for x-ray reports.  Consultations:  None  Discharge Exam: Vitals:   02/07/18 0517 02/07/18 1058  BP: 90/80 115/70  Pulse: 88 69  Resp: 16   Temp: 97.7 F (36.5 C)   SpO2: 100%     General: Afebrile, no chest pain, no shortness of breath, no nausea, no vomiting; having improvement in her oral intake after the diet has been advanced and reporting 2-3 loose stools in the last 24 hours. Cardiovascular: Rate controlled, no JVD appreciated on exam.  No rubs, no gallops Respiratory: Good oxygen saturation on room air, no crackles, no wheezing Abdomen: Soft, nontender, nondistended, positive bowel sounds Extremities: No edema, no cyanosis, no clubbing.  Discharge Instructions   Discharge Instructions    (HEART FAILURE PATIENTS) Call MD:  Anytime you have any of the following symptoms: 1) 3 pound weight gain in 24 hours or 5 pounds in 1 week 2) shortness of breath, with or without a dry hacking cough 3) swelling in the hands, feet or stomach 4) if you have to sleep on extra pillows at night in order to breathe.   Complete by:  As directed    Diet - low sodium heart  healthy   Complete by:  As directed    Discharge instructions   Complete by:  As directed    Medications as prescribed Keep yourself well-hydrated Hold lisinopril until follow-up with your PCP Notice changes in the rest of your blood pressure medication. Maintain upright position while eating and keep yourself in upright position at least for 40 minutes after completing 2 meals. Follow heart healthy diet, multiple small meals per day and minimize/avoid carbonated substances.     Allergies as of 02/07/2018      Reactions   Sotalol Other (See Comments)   Torsades   Bactrim [sulfamethoxazole-trimethoprim]    Itchy rash   Ciprofloxacin Nausea Only   Actos [pioglitazone] Swelling   Adhesive [tape] Other (See Comments)   Causes sores   Contrast Media [iodinated Diagnostic Agents] Other (See Comments)   Headache (no action/pre-med required)   Latex Rash   Lipitor [atorvastatin] Other (See Comments)   myalgia      Medication List    STOP taking these medications   calcium carbonate 500 MG chewable tablet Commonly known as:  TUMS - dosed in mg elemental calcium     TAKE these medications   amLODipine 2.5 MG tablet Commonly known as:  NORVASC Take 1 tablet (2.5 mg total) by mouth daily.   CENTRUM SILVER ULTRA WOMENS PO Take 1 tablet by mouth daily.   CITRACAL + D PO Take 1 tablet by mouth daily.   diphenoxylate-atropine 2.5-0.025 MG tablet Commonly known as:  LOMOTIL Take 1 tablet by mouth every 8 (eight)  hours as needed for diarrhea or loose stools.   ELIQUIS 5 MG Tabs tablet Generic drug:  apixaban Take 1 tablet (5 mg total) by mouth 2 (two) times daily.   famotidine 20 MG tablet Commonly known as:  PEPCID Take 1 tablet (20 mg total) by mouth at bedtime.   lisinopril 20 MG tablet Commonly known as:  PRINIVIL,ZESTRIL Take 1 tablet (20 mg total) by mouth 2 (two) times daily. Hold until follow up with PCP What changed:  additional instructions   metoprolol tartrate  50 MG tablet Commonly known as:  LOPRESSOR Take 0.5 tablets (25 mg total) by mouth 2 (two) times daily.   nitroGLYCERIN 0.4 MG SL tablet Commonly known as:  NITROSTAT Place 1 tablet (0.4 mg total) under the tongue every 5 (five) minutes as needed for chest pain.   pantoprazole 40 MG tablet Commonly known as:  PROTONIX Take 1 tablet (40 mg total) by mouth daily. Start taking on:  February 08, 2018 What changed:  when to take this   PARoxetine 20 MG tablet Commonly known as:  PAXIL TAKE (1) TABLET DAILY IN THE MORNING.   rosuvastatin 40 MG tablet Commonly known as:  CRESTOR Take 0.5 tablets (20 mg total) by mouth daily.   torsemide 20 MG tablet Commonly known as:  DEMADEX Take 20 mg by mouth daily as needed (edema).   Vitamin D 1000 units capsule Take 2,000 Units by mouth daily.      Allergies  Allergen Reactions  . Sotalol Other (See Comments)    Torsades   . Bactrim [Sulfamethoxazole-Trimethoprim]     Itchy rash  . Ciprofloxacin Nausea Only  . Actos [Pioglitazone] Swelling  . Adhesive [Tape] Other (See Comments)    Causes sores  . Contrast Media [Iodinated Diagnostic Agents] Other (See Comments)    Headache (no action/pre-med required)  . Latex Rash  . Lipitor [Atorvastatin] Other (See Comments)    myalgia   Follow-up Information    Chipper Herb, MD. Schedule an appointment as soon as possible for a visit in 10 day(s).   Specialty:  Family Medicine Contact information: [place] [address] [phone]        Sanda Klein, MD .   Specialty:  Cardiology Contact information: [address] [place] [address] [phone]           The results of significant diagnostics from this hospitalization (including imaging, microbiology, ancillary and laboratory) are listed below for reference.    Significant Diagnostic Studies: Ct Abdomen Pelvis Wo Contrast  Result Date: 02/05/2018 CLINICAL DATA:  Abdominal pain  EXAM: CT ABDOMEN AND PELVIS WITHOUT CONTRAST TECHNIQUE: Multidetector CT imaging of the abdomen and pelvis was performed following the standard protocol without IV contrast. COMPARISON:  05/11/2017 FINDINGS: Lower chest: Transvenous pacing leads partially visualized. Scattered coronary calcifications. No pleural or pericardial effusion. Visualized lung bases clear. Hepatobiliary: No focal liver abnormality is seen. No gallstones, gallbladder wall thickening, or biliary dilatation. Pancreas: Unremarkable. No pancreatic ductal dilatation or surrounding inflammatory changes. Spleen: Normal in size without focal abnormality. Adrenals/Urinary Tract: Normal adrenals. Renal atrophy left worse than right, with scattered areas of calcifications/mineralization. No hydronephrosis. Stomach/Bowel: Stomach is physiologically distended. Small bowel decompressed. Normal appendix. The colon is nondilated. Moderate rectal distention by fecal material. Vascular/Lymphatic: Moderate aortoiliac atheromatous calcifications without aneurysm. No abdominal or pelvic adenopathy localized. Reproductive: Uterus and bilateral adnexa are unremarkable. Other: No ascites. No free air. Musculoskeletal: Lumbar levoscoliosis with multilevel spondylitic change. Chronic T12 compression  deformity. No acute fracture or worrisome bone lesion. IMPRESSION: 1. No acute findings. 2. Nonobstructive bowel gas pattern. Moderate rectal distention by fecal material. 3. Coronary and aortoiliac atherosclerosis (ICD10-170.0). Electronically Signed   By: Lucrezia Europe M.D.   On: 02/05/2018 14:51   Dg Chest 1 View  Result Date: 02/05/2018 CLINICAL DATA:  Weakness, abdominal pain, nausea and diarrhea, history coronary artery disease post MI, stage III chronic kidney disease, clotting disorder, diabetes mellitus, hypertension EXAM: CHEST  1 VIEW COMPARISON:  05/19/2013 FINDINGS: LEFT subclavian ICD with leads projecting over RIGHT ventricle. Enlargement of cardiac  silhouette. Atherosclerotic calcification aorta. Mediastinal contours and pulmonary vascularity normal. Lungs clear. No pulmonary infiltrate, pleural effusion or pneumothorax. Bones demineralized. IMPRESSION: Enlargement of cardiac silhouette post ICD. No acute abnormalities. Electronically Signed   By: Lavonia Dana M.D.   On: 02/05/2018 12:49   Mm 3d Screen Breast Bilateral  Result Date: 01/10/2018 CLINICAL DATA:  Screening. EXAM: DIGITAL SCREENING BILATERAL MAMMOGRAM WITH TOMO AND CAD COMPARISON:  Previous exam(s). ACR Breast Density Category b: There are scattered areas of fibroglandular density. FINDINGS: There are no findings suspicious for malignancy. Images were processed with CAD. IMPRESSION: No mammographic evidence of malignancy. A result letter of this screening mammogram will be mailed directly to the patient. RECOMMENDATION: Screening mammogram in one year. (Code:SM-B-01Y) BI-RADS CATEGORY  1: Negative. Electronically Signed   By: Lajean Manes M.D.   On: 01/10/2018 12:36    Microbiology: Recent Results (from the past 240 hour(s))  C difficile quick scan w PCR reflex     Status: None   Collection Time: 02/05/18 12:19 PM  Result Value Ref Range Status   C Diff antigen NEGATIVE NEGATIVE Final   C Diff toxin NEGATIVE NEGATIVE Final   C Diff interpretation No C. difficile detected.  Final    Comment: Performed at [facility], [address], [place], [place] [address]     Labs: Basic Metabolic Panel: Recent Labs  Lab 02/05/18 1124 02/06/18 0446 02/07/18 0453  NA 130* 132* 135  K 3.4* 3.4* 3.7  CL 102 110 115*  CO2 16* 13* 14*  GLUCOSE 229* 112* 142*  BUN 56* 48* 49*  CREATININE 2.64* 2.22* 1.91*  CALCIUM 10.1 8.7* 8.7*  MG 2.2 1.9  --    Liver Function Tests: Recent Labs  Lab 02/05/18 1124 02/06/18 0446  AST 19 14*  ALT 26 16  ALKPHOS 83 65  BILITOT 0.5 0.5  PROT 7.2 5.8*  ALBUMIN 3.9 3.2*   Recent Labs  Lab 02/05/18 1124 02/06/18 0446  LIPASE 196* 100*    CBC: Recent Labs  Lab 02/05/18 1124 02/06/18 0446  WBC 9.6 7.6  HGB 13.9 12.8  HCT 42.6 40.3  MCV 93.4 97.1  PLT 264 215   CBG: Recent Labs  Lab 02/05/18 1838 02/06/18 1108 02/07/18 0655  GLUCAP 132* 132* 129*     Signed:  Barton Dubois MD.  [facility] 02/07/2018, 11:49 AM

## 2018-02-07 NOTE — Care Management Note (Signed)
Case Management Note  Patient Details  Name: [name] MRN: [id number] Date of Birth: 1931/10/23  Subjective/Objective:                    Action/Plan: Patient now agreeable to home health PT. Defers to son.  Call to son to go over agencies. He has no preference. Went over [employer] and quality ratings and SOC times.  Referral to [facility]. [name] of [facility] notified and will obtain order via Epic. SOC day after DC.   Expected Discharge Date:     02/08/18             Expected Discharge Plan:  [place]  In-House Referral:     Discharge planning Services  CM Consult  Post Acute Care Choice:  Home Health Choice offered to:  Patient, Adult Children  DME Arranged:    DME Agency:     HH Arranged:  PT HH Agency:  [facility]  Status of Service:  Completed, signed off  If discussed at [place] of Stay Meetings, dates discussed:    Additional Comments:  Mendell Bontempo, Chauncey Reading, RN 02/07/2018, 10:49 AM

## 2018-02-08 ENCOUNTER — Telehealth: Payer: Self-pay | Admitting: Family Medicine

## 2018-02-08 DIAGNOSIS — I482 Chronic atrial fibrillation, unspecified: Secondary | ICD-10-CM | POA: Diagnosis not present

## 2018-02-08 DIAGNOSIS — N200 Calculus of kidney: Secondary | ICD-10-CM | POA: Diagnosis not present

## 2018-02-08 DIAGNOSIS — N183 Chronic kidney disease, stage 3 (moderate): Secondary | ICD-10-CM | POA: Diagnosis not present

## 2018-02-08 DIAGNOSIS — I130 Hypertensive heart and chronic kidney disease with heart failure and stage 1 through stage 4 chronic kidney disease, or unspecified chronic kidney disease: Secondary | ICD-10-CM | POA: Diagnosis not present

## 2018-02-08 DIAGNOSIS — E876 Hypokalemia: Secondary | ICD-10-CM | POA: Diagnosis not present

## 2018-02-08 DIAGNOSIS — I252 Old myocardial infarction: Secondary | ICD-10-CM | POA: Diagnosis not present

## 2018-02-08 DIAGNOSIS — K648 Other hemorrhoids: Secondary | ICD-10-CM | POA: Diagnosis not present

## 2018-02-08 DIAGNOSIS — K589 Irritable bowel syndrome without diarrhea: Secondary | ICD-10-CM | POA: Diagnosis not present

## 2018-02-08 DIAGNOSIS — K449 Diaphragmatic hernia without obstruction or gangrene: Secondary | ICD-10-CM | POA: Diagnosis not present

## 2018-02-08 DIAGNOSIS — E1122 Type 2 diabetes mellitus with diabetic chronic kidney disease: Secondary | ICD-10-CM | POA: Diagnosis not present

## 2018-02-08 DIAGNOSIS — E1151 Type 2 diabetes mellitus with diabetic peripheral angiopathy without gangrene: Secondary | ICD-10-CM | POA: Diagnosis not present

## 2018-02-08 DIAGNOSIS — E785 Hyperlipidemia, unspecified: Secondary | ICD-10-CM | POA: Diagnosis not present

## 2018-02-08 DIAGNOSIS — M545 Low back pain: Secondary | ICD-10-CM | POA: Diagnosis not present

## 2018-02-08 DIAGNOSIS — E1136 Type 2 diabetes mellitus with diabetic cataract: Secondary | ICD-10-CM | POA: Diagnosis not present

## 2018-02-08 DIAGNOSIS — K5712 Diverticulitis of small intestine without perforation or abscess without bleeding: Secondary | ICD-10-CM | POA: Diagnosis not present

## 2018-02-08 DIAGNOSIS — I2510 Atherosclerotic heart disease of native coronary artery without angina pectoris: Secondary | ICD-10-CM | POA: Diagnosis not present

## 2018-02-08 DIAGNOSIS — Z9181 History of falling: Secondary | ICD-10-CM | POA: Diagnosis not present

## 2018-02-08 DIAGNOSIS — I5032 Chronic diastolic (congestive) heart failure: Secondary | ICD-10-CM | POA: Diagnosis not present

## 2018-02-08 DIAGNOSIS — G8929 Other chronic pain: Secondary | ICD-10-CM | POA: Diagnosis not present

## 2018-02-08 DIAGNOSIS — Z95810 Presence of automatic (implantable) cardiac defibrillator: Secondary | ICD-10-CM | POA: Diagnosis not present

## 2018-02-08 DIAGNOSIS — Z86718 Personal history of other venous thrombosis and embolism: Secondary | ICD-10-CM | POA: Diagnosis not present

## 2018-02-08 DIAGNOSIS — H3530 Unspecified macular degeneration: Secondary | ICD-10-CM | POA: Diagnosis not present

## 2018-02-08 DIAGNOSIS — K2990 Gastroduodenitis, unspecified, without bleeding: Secondary | ICD-10-CM | POA: Diagnosis not present

## 2018-02-08 DIAGNOSIS — F418 Other specified anxiety disorders: Secondary | ICD-10-CM | POA: Diagnosis not present

## 2018-02-08 NOTE — Telephone Encounter (Signed)
Pt scheduled with Dr Darnell Level 1/28 at 8:15 for hospital follow up.

## 2018-02-08 NOTE — Telephone Encounter (Signed)
Spoke with pt daugther [name] she would like to speak to [place] about getting the pt in for hosp f/u she was discharged yesterday and needs a f/u within 10 days to see dr Laurance Flatten

## 2018-02-09 ENCOUNTER — Other Ambulatory Visit: Payer: Self-pay | Admitting: Family Medicine

## 2018-02-12 ENCOUNTER — Ambulatory Visit (INDEPENDENT_AMBULATORY_CARE_PROVIDER_SITE_OTHER): Payer: Medicare Other | Admitting: Family Medicine

## 2018-02-12 ENCOUNTER — Encounter: Payer: Self-pay | Admitting: Family Medicine

## 2018-02-12 VITALS — BP 138/70 | HR 58 | Temp 97.0°F | Ht 63.0 in | Wt 159.0 lb

## 2018-02-12 DIAGNOSIS — N189 Chronic kidney disease, unspecified: Secondary | ICD-10-CM

## 2018-02-12 DIAGNOSIS — N183 Chronic kidney disease, stage 3 (moderate): Secondary | ICD-10-CM | POA: Diagnosis not present

## 2018-02-12 DIAGNOSIS — Z09 Encounter for follow-up examination after completed treatment for conditions other than malignant neoplasm: Secondary | ICD-10-CM

## 2018-02-12 DIAGNOSIS — N179 Acute kidney failure, unspecified: Secondary | ICD-10-CM | POA: Diagnosis not present

## 2018-02-12 DIAGNOSIS — E1122 Type 2 diabetes mellitus with diabetic chronic kidney disease: Secondary | ICD-10-CM | POA: Diagnosis not present

## 2018-02-12 DIAGNOSIS — I130 Hypertensive heart and chronic kidney disease with heart failure and stage 1 through stage 4 chronic kidney disease, or unspecified chronic kidney disease: Secondary | ICD-10-CM | POA: Diagnosis not present

## 2018-02-12 DIAGNOSIS — M545 Low back pain, unspecified: Secondary | ICD-10-CM

## 2018-02-12 DIAGNOSIS — R195 Other fecal abnormalities: Secondary | ICD-10-CM | POA: Diagnosis not present

## 2018-02-12 DIAGNOSIS — I5032 Chronic diastolic (congestive) heart failure: Secondary | ICD-10-CM | POA: Diagnosis not present

## 2018-02-12 DIAGNOSIS — I252 Old myocardial infarction: Secondary | ICD-10-CM | POA: Diagnosis not present

## 2018-02-12 DIAGNOSIS — I2510 Atherosclerotic heart disease of native coronary artery without angina pectoris: Secondary | ICD-10-CM | POA: Diagnosis not present

## 2018-02-12 NOTE — Patient Instructions (Signed)
You had labs performed today.  You will be contacted with the results of the labs once they are available, usually in the next 3 business days for routine lab work.   Diet for Irritable Bowel Syndrome When you have irritable bowel syndrome (IBS), it is very important to eat the foods and follow the eating habits that are best for your condition. IBS may cause various symptoms such as pain in the abdomen, constipation, or diarrhea. Choosing the right foods can help to ease the discomfort from these symptoms. Work with your health care provider and diet and nutrition specialist (dietitian) to find the eating plan that will help to control your symptoms. What are tips for following this plan?      Keep a food diary. This will help you identify foods that cause symptoms. Write down: ? What you eat and when you eat it. ? What symptoms you have. ? When symptoms occur in relation to your meals, such as "pain in abdomen 2 hours after dinner."  Eat your meals slowly and in a relaxed setting.  Aim to eat 5-6 small meals per day. Do not skip meals.  Drink enough fluid to keep your urine pale yellow.  Ask your health care provider if you should take an over-the-counter probiotic to help restore healthy bacteria in your gut (digestive tract). ? Probiotics are foods that contain good bacteria and yeasts.  Your dietitian may have specific dietary recommendations for you based on your symptoms. He or she may recommend that you: ? Avoid foods that cause symptoms. Talk with your dietitian about other ways to get the same nutrients that are in those problem foods. ? Avoid foods with gluten. Gluten is a protein that is found in rye, wheat, and barley. ? Eat more foods that contain soluble fiber. Examples of foods with high soluble fiber include oats, seeds, and certain fruits and vegetables. Take a fiber supplement if directed by your dietitian. ? Reduce or avoid certain foods called FODMAPs. These are foods  that contain carbohydrates that are hard to digest. Ask your doctor which foods contain these carbohydrates. What foods are not recommended? The following are some foods and drinks that may make your symptoms worse:  Fatty foods, such as french fries.  Foods that contain gluten, such as pasta and cereal.  Dairy products, such as milk, cheese, and ice cream.  Chocolate.  Alcohol.  Products with caffeine, such as coffee.  Carbonated drinks, such as soda.  Foods that are high in FODMAPs. These include certain fruits and vegetables.  Products with sweeteners such as honey, high fructose corn syrup, sorbitol, and mannitol. The items listed above may not be a complete list of foods and beverages you should avoid. Contact a dietitian for more information. What foods are good sources of fiber? Your health care provider or dietitian may recommend that you eat more foods that contain fiber. Fiber can help to reduce constipation and other IBS symptoms. Add foods with fiber to your diet a little at a time so your body can get used to them. Too much fiber at one time might cause gas and swelling of your abdomen. The following are some foods that are good sources of fiber:  Berries, such as raspberries, strawberries, and blueberries.  Tomatoes.  Carrots.  Brown rice.  Oats.  Seeds, such as chia and pumpkin seeds. The items listed above may not be a complete list of recommended sources of fiber. Contact your dietitian for more options. Where to  find more TEFL teacher for Functional Gastrointestinal Disorders: www.iffgd.[employer] of Diabetes and Digestive and Kidney Diseases: [employer] Summary  When you have irritable bowel syndrome (IBS), it is very important to eat the foods and follow the eating habits that are best for your condition.  IBS may cause various symptoms such as pain in the abdomen, constipation, or diarrhea.  Choosing the  right foods can help to ease the discomfort that comes from symptoms.  Keep a food diary. This will help you identify foods that cause symptoms.  Your health care provider or diet and nutrition specialist (dietitian) may recommend that you eat more foods that contain fiber. This information is not intended to replace advice given to you by your health care provider. Make sure you discuss any questions you have with your health care provider. Document Released: 03/25/2003 Document Revised: 07/30/2017 Document Reviewed: 09/05/2016 Elsevier Interactive Patient Education  2019 [place] Choices to Help Relieve Diarrhea, Adult When you have diarrhea, the foods you eat and your eating habits are very important. Choosing the right foods and drinks can help:  Relieve diarrhea.  Replace lost fluids and nutrients.  Prevent dehydration. What general guidelines should I follow?  Relieving diarrhea  Choose foods with less than 2 g or .07 oz. of fiber per serving.  Limit fats to less than 8 tsp (38 g or 1.34 oz.) a day.  Avoid the following: ? Foods and beverages sweetened with high-fructose corn syrup, honey, or sugar alcohols such as xylitol, sorbitol, and mannitol. ? Foods that contain a lot of fat or sugar. ? Fried, greasy, or spicy foods. ? High-fiber grains, breads, and cereals. ? Raw fruits and vegetables.  Eat foods that are rich in probiotics. These foods include dairy products such as yogurt and fermented milk products. They help increase healthy bacteria in the stomach and intestines (gastrointestinal tract, or GI tract).  If you have lactose intolerance, avoid dairy products. These may make your diarrhea worse.  Take medicine to help stop diarrhea (antidiarrheal medicine) only as told by your health care provider. Replacing nutrients  Eat small meals or snacks every 3-4 hours.  Eat bland foods, such as white rice, toast, or baked potato, until your diarrhea starts to  get better. Gradually reintroduce nutrient-rich foods as tolerated or as told by your health care provider. This includes: ? Well-cooked protein foods. ? Peeled, seeded, and soft-cooked fruits and vegetables. ? Low-fat dairy products.  Take vitamin and mineral supplements as told by your health care provider. Preventing dehydration  Start by sipping water or a special solution to prevent dehydration (oral rehydration solution, ORS). Urine that is clear or pale yellow means that you are getting enough fluid.  Try to drink at least 8-10 cups of fluid each day to help replace lost fluids.  You may add other liquids in addition to water, such as clear juice or decaffeinated sports drinks, as tolerated or as told by your health care provider.  Avoid drinks with caffeine, such as coffee, tea, or soft drinks.  Avoid alcohol. What foods are recommended?     The items listed may not be a complete list. Talk with your health care provider about what dietary choices are best for you. Grains White rice. White, [place], or pita breads (fresh or toasted), including plain rolls, buns, or bagels. White pasta. Saltine, soda, or graham crackers. Pretzels. Low-fiber cereal. Cooked cereals made with water (such as cornmeal, farina, or cream cereals). Plain  muffins. Matzo. Melba toast. Zwieback. Vegetables Potatoes (without the skin). Most well-cooked and canned vegetables without skins or seeds. Tender lettuce. Fruits Apple sauce. Fruits canned in juice. Cooked apricots, cherries, grapefruit, peaches, pears, or plums. Fresh bananas and cantaloupe. Meats and other protein foods Baked or boiled chicken. Eggs. Tofu. Fish. Seafood. Smooth nut butters. Ground or well-cooked tender beef, ham, veal, lamb, pork, or poultry. Dairy Plain yogurt, kefir, and unsweetened liquid yogurt. Lactose-free milk, buttermilk, skim milk, or soy milk. Low-fat or nonfat hard cheese. Beverages Water. Low-calorie sports drinks.  Fruit juices without pulp. Strained tomato and vegetable juices. Decaffeinated teas. Sugar-free beverages not sweetened with sugar alcohols. Oral rehydration solutions, if approved by your health care provider. Seasoning and other foods Bouillon, broth, or soups made from recommended foods. What foods are not recommended? The items listed may not be a complete list. Talk with your health care provider about what dietary choices are best for you. Grains Whole grain, whole wheat, bran, or rye breads, rolls, pastas, and crackers. Wild or brown rice. Whole grain or bran cereals. Barley. Oats and oatmeal. Corn tortillas or taco shells. Granola. Popcorn. Vegetables Raw vegetables. Fried vegetables. Cabbage, broccoli, Brussels sprouts, artichokes, baked beans, beet greens, corn, kale, legumes, peas, sweet potatoes, and yams. Potato skins. Cooked spinach and cabbage. Fruits Dried fruit, including raisins and dates. Raw fruits. Stewed or dried prunes. Canned fruits with syrup. Meat and other protein foods Fried or fatty meats. Deli meats. Chunky nut butters. Nuts and seeds. Beans and lentils. Berniece Salines. Hot dogs. Sausage. Dairy High-fat cheeses. Whole milk, chocolate milk, and beverages made with milk, such as milk shakes. Half-and-half. Cream. sour cream. Ice cream. Beverages Caffeinated beverages (such as coffee, tea, soda, or energy drinks). Alcoholic beverages. Fruit juices with pulp. Prune juice. Soft drinks sweetened with high-fructose corn syrup or sugar alcohols. High-calorie sports drinks. Fats and oils Butter. Cream sauces. Margarine. Salad oils. Plain salad dressings. Olives. Avocados. Mayonnaise. Sweets and desserts Sweet rolls, doughnuts, and sweet breads. Sugar-free desserts sweetened with sugar alcohols such as xylitol and sorbitol. Seasoning and other foods Honey. Hot sauce. Chili powder. Gravy. Cream-based or milk-based soups. Pancakes and waffles. Summary  When you have diarrhea, the  foods you eat and your eating habits are very important.  Make sure you get at least 8-10 cups of fluid each day, or enough to keep your urine clear or pale yellow.  Eat bland foods and gradually reintroduce healthy, nutrient-rich foods as tolerated, or as told by your health care provider.  Avoid high-fiber, fried, greasy, or spicy foods. This information is not intended to replace advice given to you by your health care provider. Make sure you discuss any questions you have with your health care provider. Document Released: 03/25/2003 Document Revised: 12/31/2015 Document Reviewed: 12/31/2015 Elsevier Interactive Patient Education  2019 [employer].

## 2018-02-12 NOTE — Progress Notes (Signed)
Subjective: CC: Hospital follow up PCP: Chipper Herb, MD CVE:LFYBOFB [name] is a 85 y.o. female presenting to clinic today for:  1. Hospital follow up for AKI Patient was admitted for acute on chronic kidney disease secondary to dehydration from diarrhea.  Diarrhea was thought to be secondary to IBS she had been having diarrhea for about 6 months but it increased over the preceding 3 to 4 days.  CDiff negative. She was treated with Lomotil.  She was hydrated in the hospital.  Creatinine at discharge was 1.91, her baseline appears to be around 1.25.  Potassium 3.7. Her Protonix was continued and Pepcid was added at discharge. Lisinopril was held in the setting of kidney injury. HHPT was arranged at discharge.  Patient notes that overall since she has been discharged she has been doing well.  She feels like she is gaining strength.  She was evaluated by home health physical therapy last week and there are plans for them to return today for physical therapy.  She is optimistic about this.  She is ambulating independently with her walker and cane.  She has not had any recurrent falls.  She continues to have some loose stools but it has gotten significantly better than previous.  She is hydrating well.  She wishes to have a handout with what foods to avoid to reduce recurrence of diarrhea.  Denies melena or hematochezia.  She does reports increased acid reflux.  She is under the impression she was supposed to stop both Tums and Protonix and only use Pepcid.  She ended up using Tums a few days ago for acid reflux flare.   ROS: Per HPI  Allergies  Allergen Reactions  . Sotalol Other (See Comments)    Torsades   . Bactrim [Sulfamethoxazole-Trimethoprim]     Itchy rash  . Ciprofloxacin Nausea Only  . Actos [Pioglitazone] Swelling  . Adhesive [Tape] Other (See Comments)    Causes sores  . Contrast Media [Iodinated Diagnostic Agents] Other (See Comments)    Headache (no action/pre-med  required)  . Latex Rash  . Lipitor [Atorvastatin] Other (See Comments)    myalgia   Past Medical History:  Diagnosis Date  . AICD (automatic cardioverter/defibrillator) present   . Allergy    SESONAL  . Anxiety   . ARTHRITIS   . Arthritis   . Atrial fibrillation ([place])   . CAD (coronary artery disease)    a. history of cardiac arrest 1993;  b. s/p LAD/LCX stenting in 2003;  c. 07/2011 Cath: LM nl, LAD patent stent, D1 80ost, LCX patent stent, RCA min irregs;  d. 04/2012 MV: EF 66%, no ishcemia;  e. 12/2013 Echo: Ef 55%, no rwma, Gr 1 DD, triv AI/MR, mildly dil LA;  f. 12/2013 Lexi MV: intermediate risk - apical ischemia and inf/infsept fixed defect, ? artifactual.  . CAD in native artery 12/31/2013   Previous stents to LAD and Ramus patent on cath today 12/31/13 also There is severe disease in the ostial first diagonal which is unchanged from most recent cardiac catheterization. The right coronary artery could not be engaged selectively but nonselective angiography showed no significant disease in the proximal and midsegment.     . Cataract    DENIES  . Chronic back pain   . Chronic sinus bradycardia   . CKD (chronic kidney disease), stage III ([place])   . Clotting disorder ([facility])    DVT  . Diabetes mellitus without complication ([place])    DENIES  . Diverticulosis  of colon (without mention of hemorrhage) 2007   Colonoscopy   . Esophageal stricture    a. 2012 s/p dil.  . Esophagitis, unspecified    a. 2012 EGD  . EXTERNAL HEMORRHOIDS   . GERD (gastroesophageal reflux disease)    omeprazole  . H/O hiatal hernia   . Hiatal hernia    a. 2012 EGD.  Marland Kitchen History of DVT in the past, not on Coumadin now    left  leg  . HYPERCHOLESTEROLEMIA   . Hypertension   . ICD (implantable cardiac defibrillator) in place    a. s/p initial ICD in 1993 in setting of cardiac arrest;  b. 01/2007 gen change: Guidant T135 Vitality DS VR single lead ICD.  . Macular degeneration    gets injecton in eye every 5  weeks- last injection - 05/03/2013   . Myocardial infarction ([place]) 1993  . Nephrolithiasis, just saw Dr [name]   . Orthostatic hypotension   . Peripheral vascular disease ([place])    ???  . Unspecified gastritis and gastroduodenitis without mention of hemorrhage    a. 2003 EGD->not noted on 2012 EGD.    Current Outpatient Medications:  .  amLODipine (NORVASC) 2.5 MG tablet, Take 1 tablet (2.5 mg total) by mouth daily., Disp: 90 tablet, Rfl: 3 .  Calcium Citrate-Vitamin D (CITRACAL + D PO), Take 1 tablet by mouth daily., Disp: , Rfl:  .  Cholecalciferol (VITAMIN D) 1000 UNITS capsule, Take 2,000 Units by mouth daily. , Disp: , Rfl:  .  diphenoxylate-atropine (LOMOTIL) 2.5-0.025 MG tablet, Take 1 tablet by mouth every 8 (eight) hours as needed for diarrhea or loose stools., Disp: 30 tablet, Rfl: 1 .  ELIQUIS 5 MG TABS tablet, Take 1 tablet (5 mg total) by mouth 2 (two) times daily., Disp: 180 tablet, Rfl: 1 .  famotidine (PEPCID) 20 MG tablet, Take 1 tablet (20 mg total) by mouth at bedtime., Disp: 30 tablet, Rfl: 3 .  lisinopril (PRINIVIL,ZESTRIL) 20 MG tablet, Take 1 tablet (20 mg total) by mouth 2 (two) times daily. Hold until follow up with PCP, Disp: , Rfl:  .  metoprolol tartrate (LOPRESSOR) 50 MG tablet, Take 0.5 tablets (25 mg total) by mouth 2 (two) times daily., Disp: , Rfl:  .  Multiple Vitamins-Minerals (CENTRUM SILVER ULTRA WOMENS PO), Take 1 tablet by mouth daily., Disp: , Rfl:  .  nitroGLYCERIN (NITROSTAT) 0.4 MG SL tablet, Place 1 tablet (0.4 mg total) under the tongue every 5 (five) minutes as needed for chest pain., Disp: 25 tablet, Rfl: 4 .  pantoprazole (PROTONIX) 40 MG tablet, Take 1 tablet (40 mg total) by mouth daily., Disp: 30 tablet, Rfl: 3 .  PARoxetine (PAXIL) 20 MG tablet, TAKE (1) TABLET DAILY IN THE MORNING., Disp: 90 tablet, Rfl: 0 .  rosuvastatin (CRESTOR) 40 MG tablet, Take 0.5 tablets (20 mg total) by mouth daily., Disp: 90 tablet, Rfl: 0 .  torsemide  (DEMADEX) 20 MG tablet, Take 20 mg by mouth daily as needed (edema)., Disp: , Rfl:  Social History   Socioeconomic History  . Marital status: Widowed    Spouse name: Not on file  . Number of children: Not on file  . Years of education: Not on file  . Highest education level: Not on file  Occupational History  . Not on file  Social Needs  . Financial resource strain: Not on file  . Food insecurity:    Worry: Not on file    Inability: Not on file  .  Transportation needs:    Medical: Not on file    Non-medical: Not on file  Tobacco Use  . Smoking status: Never Smoker  . Smokeless tobacco: Never Used  Substance and Sexual Activity  . Alcohol use: No  . Drug use: No  . Sexual activity: Never    Birth control/protection: Post-menopausal  Lifestyle  . Physical activity:    Days per week: Not on file    Minutes per session: Not on file  . Stress: Not on file  Relationships  . Social connections:    Talks on phone: Not on file    Gets together: Not on file    Attends religious service: Not on file    Active member of club or organization: Not on file    Attends meetings of clubs or organizations: Not on file    Relationship status: Not on file  . Intimate partner violence:    Fear of current or ex partner: Not on file    Emotionally abused: Not on file    Physically abused: Not on file    Forced sexual activity: Not on file  Other Topics Concern  . Not on file  Social History Narrative  . Not on file   Family History  Problem Relation Age of Onset  . Stroke Mother   . Other Mother        brain tumor  . Other Father        MI  . Heart attack Father   . Stroke Sister   . Macular degeneration Sister   . Diabetes Daughter   . Cancer Daughter        ovarian  . Colon polyps Daughter   . Atrial fibrillation Sister   . Hyperlipidemia Sister   . Osteoporosis Sister   . Stroke Sister   . Uterine cancer Sister   . Heart attack Brother   . Heart disease Brother   .  Asthma Brother   . Breast cancer Neg Hx     Objective: Office vital signs reviewed. BP 138/70   Pulse (!) 58   Temp (!) 97 F (36.1 C) (Oral)   Ht 5\' 3"  (1.6 m)   Wt 159 lb (72.1 kg)   BMI 28.17 kg/m   Physical Examination:  General: Awake, alert, well appearing, elderly female, No acute distress HEENT: sclera white, MMM Cardio: irregularly irregular w/ normal rate, S1S2 heard, no murmurs appreciated Pulm: clear to auscultation bilaterally, no wheezes, rhonchi or rales; normal work of breathing on room air GI: soft, mild generalized TTP, non-distended, bowel sounds present x4, no hepatomegaly, no splenomegaly, no masses MSK: mild TTP to the right low back.  There is a palpable knot within the soft tissue over the lumbosacral junction on the right.  Assessment/ Plan: 85 y.o. female   1. Acute kidney injury superimposed on chronic kidney disease ([place]) I reviewed her hospital course and discharge summary including lab results.  We will recheck her kidney function including potassium and magnesium levels.  She seems to be doing better subjectively.  Continue to hold ACE inhibitor until these labs result.  BP is stable. - Basic Metabolic Panel - Magnesium  2. Hospital discharge follow-up - Basic Metabolic Panel - Magnesium  3. Acute right-sided low back pain without sciatica Likely contusion w/ soft tissue swelling vs small hematoma that is palpable on the right low back.  I reviewed her CT abdomen pelvis which demonstrated chronic compression fracture at T12 level but no acute abnormalities.  Continue supportive care.   Orders Placed This Encounter  Procedures  . Basic Metabolic Panel  . Magnesium   No orders of the defined types were placed in this encounter.    [name], DO [place] [phone]

## 2018-02-12 NOTE — Addendum Note (Signed)
Addended by: Earlene Plater on: 02/12/2018 03:22 PM   Modules accepted: Orders

## 2018-02-13 ENCOUNTER — Ambulatory Visit: Payer: Medicare Other | Admitting: Pediatrics

## 2018-02-13 LAB — BASIC METABOLIC PANEL
BUN/Creatinine Ratio: 15 (ref 12–28)
BUN: 22 mg/dL (ref 8–27)
CALCIUM: 9.2 mg/dL (ref 8.7–10.3)
CO2: 15 mmol/L — ABNORMAL LOW (ref 20–29)
Chloride: 109 mmol/L — ABNORMAL HIGH (ref 96–106)
Creatinine, Ser: 1.46 mg/dL — ABNORMAL HIGH (ref 0.57–1.00)
GFR calc Af Amer: 38 mL/min/{1.73_m2} — ABNORMAL LOW (ref >59)
GFR calc non Af Amer: 33 mL/min/{1.73_m2} — ABNORMAL LOW (ref >59)
Glucose: 139 mg/dL — ABNORMAL HIGH (ref 65–99)
Potassium: 4.1 mmol/L (ref 3.5–5.2)
Sodium: 140 mmol/L (ref 134–144)

## 2018-02-13 LAB — MAGNESIUM: MAGNESIUM: 1.9 mg/dL (ref 1.6–2.3)

## 2018-02-13 LAB — FECAL OCCULT BLOOD, IMMUNOCHEMICAL: Fecal Occult Bld: NEGATIVE

## 2018-02-19 LAB — GLUCOSE, CAPILLARY
Glucose-Capillary: 130 mg/dL — ABNORMAL HIGH (ref 70–99)
Glucose-Capillary: 130 mg/dL — ABNORMAL HIGH (ref 70–99)
Glucose-Capillary: 173 mg/dL — ABNORMAL HIGH (ref 70–99)
Glucose-Capillary: 95 mg/dL (ref 70–99)

## 2018-02-22 DIAGNOSIS — I5032 Chronic diastolic (congestive) heart failure: Secondary | ICD-10-CM | POA: Diagnosis not present

## 2018-02-22 DIAGNOSIS — E1122 Type 2 diabetes mellitus with diabetic chronic kidney disease: Secondary | ICD-10-CM | POA: Diagnosis not present

## 2018-02-22 DIAGNOSIS — I252 Old myocardial infarction: Secondary | ICD-10-CM | POA: Diagnosis not present

## 2018-02-22 DIAGNOSIS — I2510 Atherosclerotic heart disease of native coronary artery without angina pectoris: Secondary | ICD-10-CM | POA: Diagnosis not present

## 2018-02-22 DIAGNOSIS — N183 Chronic kidney disease, stage 3 (moderate): Secondary | ICD-10-CM | POA: Diagnosis not present

## 2018-02-22 DIAGNOSIS — I130 Hypertensive heart and chronic kidney disease with heart failure and stage 1 through stage 4 chronic kidney disease, or unspecified chronic kidney disease: Secondary | ICD-10-CM | POA: Diagnosis not present

## 2018-02-25 ENCOUNTER — Ambulatory Visit (INDEPENDENT_AMBULATORY_CARE_PROVIDER_SITE_OTHER): Payer: Medicare Other

## 2018-02-25 DIAGNOSIS — Z86718 Personal history of other venous thrombosis and embolism: Secondary | ICD-10-CM

## 2018-02-25 DIAGNOSIS — M545 Low back pain: Secondary | ICD-10-CM

## 2018-02-25 DIAGNOSIS — I5032 Chronic diastolic (congestive) heart failure: Secondary | ICD-10-CM

## 2018-02-25 DIAGNOSIS — I482 Chronic atrial fibrillation, unspecified: Secondary | ICD-10-CM | POA: Diagnosis not present

## 2018-02-25 DIAGNOSIS — E1136 Type 2 diabetes mellitus with diabetic cataract: Secondary | ICD-10-CM

## 2018-02-25 DIAGNOSIS — K2990 Gastroduodenitis, unspecified, without bleeding: Secondary | ICD-10-CM

## 2018-02-25 DIAGNOSIS — I252 Old myocardial infarction: Secondary | ICD-10-CM | POA: Diagnosis not present

## 2018-02-25 DIAGNOSIS — N183 Chronic kidney disease, stage 3 (moderate): Secondary | ICD-10-CM

## 2018-02-25 DIAGNOSIS — E1151 Type 2 diabetes mellitus with diabetic peripheral angiopathy without gangrene: Secondary | ICD-10-CM

## 2018-02-25 DIAGNOSIS — E1122 Type 2 diabetes mellitus with diabetic chronic kidney disease: Secondary | ICD-10-CM | POA: Diagnosis not present

## 2018-02-25 DIAGNOSIS — G8929 Other chronic pain: Secondary | ICD-10-CM | POA: Diagnosis not present

## 2018-02-25 DIAGNOSIS — N200 Calculus of kidney: Secondary | ICD-10-CM

## 2018-02-25 DIAGNOSIS — I130 Hypertensive heart and chronic kidney disease with heart failure and stage 1 through stage 4 chronic kidney disease, or unspecified chronic kidney disease: Secondary | ICD-10-CM

## 2018-02-25 DIAGNOSIS — K5712 Diverticulitis of small intestine without perforation or abscess without bleeding: Secondary | ICD-10-CM

## 2018-02-25 DIAGNOSIS — H3530 Unspecified macular degeneration: Secondary | ICD-10-CM

## 2018-02-25 DIAGNOSIS — F418 Other specified anxiety disorders: Secondary | ICD-10-CM

## 2018-02-25 DIAGNOSIS — E876 Hypokalemia: Secondary | ICD-10-CM

## 2018-02-25 DIAGNOSIS — K648 Other hemorrhoids: Secondary | ICD-10-CM

## 2018-02-25 DIAGNOSIS — E785 Hyperlipidemia, unspecified: Secondary | ICD-10-CM

## 2018-02-25 DIAGNOSIS — I2510 Atherosclerotic heart disease of native coronary artery without angina pectoris: Secondary | ICD-10-CM | POA: Diagnosis not present

## 2018-02-25 DIAGNOSIS — K589 Irritable bowel syndrome without diarrhea: Secondary | ICD-10-CM

## 2018-02-25 DIAGNOSIS — Z95810 Presence of automatic (implantable) cardiac defibrillator: Secondary | ICD-10-CM

## 2018-02-25 DIAGNOSIS — K449 Diaphragmatic hernia without obstruction or gangrene: Secondary | ICD-10-CM

## 2018-02-26 DIAGNOSIS — I130 Hypertensive heart and chronic kidney disease with heart failure and stage 1 through stage 4 chronic kidney disease, or unspecified chronic kidney disease: Secondary | ICD-10-CM | POA: Diagnosis not present

## 2018-02-26 DIAGNOSIS — I2510 Atherosclerotic heart disease of native coronary artery without angina pectoris: Secondary | ICD-10-CM | POA: Diagnosis not present

## 2018-02-26 DIAGNOSIS — N183 Chronic kidney disease, stage 3 (moderate): Secondary | ICD-10-CM | POA: Diagnosis not present

## 2018-02-26 DIAGNOSIS — I5032 Chronic diastolic (congestive) heart failure: Secondary | ICD-10-CM | POA: Diagnosis not present

## 2018-02-26 DIAGNOSIS — I252 Old myocardial infarction: Secondary | ICD-10-CM | POA: Diagnosis not present

## 2018-02-26 DIAGNOSIS — E1122 Type 2 diabetes mellitus with diabetic chronic kidney disease: Secondary | ICD-10-CM | POA: Diagnosis not present

## 2018-03-01 DIAGNOSIS — I5032 Chronic diastolic (congestive) heart failure: Secondary | ICD-10-CM | POA: Diagnosis not present

## 2018-03-01 DIAGNOSIS — I130 Hypertensive heart and chronic kidney disease with heart failure and stage 1 through stage 4 chronic kidney disease, or unspecified chronic kidney disease: Secondary | ICD-10-CM | POA: Diagnosis not present

## 2018-03-01 DIAGNOSIS — I252 Old myocardial infarction: Secondary | ICD-10-CM | POA: Diagnosis not present

## 2018-03-01 DIAGNOSIS — N183 Chronic kidney disease, stage 3 (moderate): Secondary | ICD-10-CM | POA: Diagnosis not present

## 2018-03-01 DIAGNOSIS — I2510 Atherosclerotic heart disease of native coronary artery without angina pectoris: Secondary | ICD-10-CM | POA: Diagnosis not present

## 2018-03-01 DIAGNOSIS — E1122 Type 2 diabetes mellitus with diabetic chronic kidney disease: Secondary | ICD-10-CM | POA: Diagnosis not present

## 2018-03-02 ENCOUNTER — Other Ambulatory Visit: Payer: Self-pay | Admitting: Cardiovascular Disease

## 2018-03-04 NOTE — Telephone Encounter (Signed)
Rx(s) sent to pharmacy electronically.  

## 2018-03-05 DIAGNOSIS — I252 Old myocardial infarction: Secondary | ICD-10-CM | POA: Diagnosis not present

## 2018-03-05 DIAGNOSIS — I5032 Chronic diastolic (congestive) heart failure: Secondary | ICD-10-CM | POA: Diagnosis not present

## 2018-03-05 DIAGNOSIS — I130 Hypertensive heart and chronic kidney disease with heart failure and stage 1 through stage 4 chronic kidney disease, or unspecified chronic kidney disease: Secondary | ICD-10-CM | POA: Diagnosis not present

## 2018-03-05 DIAGNOSIS — N183 Chronic kidney disease, stage 3 (moderate): Secondary | ICD-10-CM | POA: Diagnosis not present

## 2018-03-05 DIAGNOSIS — I2510 Atherosclerotic heart disease of native coronary artery without angina pectoris: Secondary | ICD-10-CM | POA: Diagnosis not present

## 2018-03-05 DIAGNOSIS — E1122 Type 2 diabetes mellitus with diabetic chronic kidney disease: Secondary | ICD-10-CM | POA: Diagnosis not present

## 2018-03-06 DIAGNOSIS — H3561 Retinal hemorrhage, right eye: Secondary | ICD-10-CM | POA: Diagnosis not present

## 2018-03-06 DIAGNOSIS — H353212 Exudative age-related macular degeneration, right eye, with inactive choroidal neovascularization: Secondary | ICD-10-CM | POA: Diagnosis not present

## 2018-03-06 DIAGNOSIS — H353113 Nonexudative age-related macular degeneration, right eye, advanced atrophic without subfoveal involvement: Secondary | ICD-10-CM | POA: Diagnosis not present

## 2018-03-06 DIAGNOSIS — H353124 Nonexudative age-related macular degeneration, left eye, advanced atrophic with subfoveal involvement: Secondary | ICD-10-CM | POA: Diagnosis not present

## 2018-03-07 DIAGNOSIS — E1122 Type 2 diabetes mellitus with diabetic chronic kidney disease: Secondary | ICD-10-CM | POA: Diagnosis not present

## 2018-03-07 DIAGNOSIS — I252 Old myocardial infarction: Secondary | ICD-10-CM | POA: Diagnosis not present

## 2018-03-07 DIAGNOSIS — I5032 Chronic diastolic (congestive) heart failure: Secondary | ICD-10-CM | POA: Diagnosis not present

## 2018-03-07 DIAGNOSIS — I2510 Atherosclerotic heart disease of native coronary artery without angina pectoris: Secondary | ICD-10-CM | POA: Diagnosis not present

## 2018-03-07 DIAGNOSIS — I130 Hypertensive heart and chronic kidney disease with heart failure and stage 1 through stage 4 chronic kidney disease, or unspecified chronic kidney disease: Secondary | ICD-10-CM | POA: Diagnosis not present

## 2018-03-07 DIAGNOSIS — N183 Chronic kidney disease, stage 3 (moderate): Secondary | ICD-10-CM | POA: Diagnosis not present

## 2018-03-10 DIAGNOSIS — K648 Other hemorrhoids: Secondary | ICD-10-CM | POA: Diagnosis not present

## 2018-03-10 DIAGNOSIS — E1151 Type 2 diabetes mellitus with diabetic peripheral angiopathy without gangrene: Secondary | ICD-10-CM | POA: Diagnosis not present

## 2018-03-10 DIAGNOSIS — E876 Hypokalemia: Secondary | ICD-10-CM | POA: Diagnosis not present

## 2018-03-10 DIAGNOSIS — I130 Hypertensive heart and chronic kidney disease with heart failure and stage 1 through stage 4 chronic kidney disease, or unspecified chronic kidney disease: Secondary | ICD-10-CM | POA: Diagnosis not present

## 2018-03-10 DIAGNOSIS — I2510 Atherosclerotic heart disease of native coronary artery without angina pectoris: Secondary | ICD-10-CM | POA: Diagnosis not present

## 2018-03-10 DIAGNOSIS — Z95810 Presence of automatic (implantable) cardiac defibrillator: Secondary | ICD-10-CM | POA: Diagnosis not present

## 2018-03-10 DIAGNOSIS — Z9181 History of falling: Secondary | ICD-10-CM | POA: Diagnosis not present

## 2018-03-10 DIAGNOSIS — K2990 Gastroduodenitis, unspecified, without bleeding: Secondary | ICD-10-CM | POA: Diagnosis not present

## 2018-03-10 DIAGNOSIS — I482 Chronic atrial fibrillation, unspecified: Secondary | ICD-10-CM | POA: Diagnosis not present

## 2018-03-10 DIAGNOSIS — E1122 Type 2 diabetes mellitus with diabetic chronic kidney disease: Secondary | ICD-10-CM | POA: Diagnosis not present

## 2018-03-10 DIAGNOSIS — Z86718 Personal history of other venous thrombosis and embolism: Secondary | ICD-10-CM | POA: Diagnosis not present

## 2018-03-10 DIAGNOSIS — H3530 Unspecified macular degeneration: Secondary | ICD-10-CM | POA: Diagnosis not present

## 2018-03-10 DIAGNOSIS — E1136 Type 2 diabetes mellitus with diabetic cataract: Secondary | ICD-10-CM | POA: Diagnosis not present

## 2018-03-10 DIAGNOSIS — N183 Chronic kidney disease, stage 3 (moderate): Secondary | ICD-10-CM | POA: Diagnosis not present

## 2018-03-10 DIAGNOSIS — F418 Other specified anxiety disorders: Secondary | ICD-10-CM | POA: Diagnosis not present

## 2018-03-10 DIAGNOSIS — K449 Diaphragmatic hernia without obstruction or gangrene: Secondary | ICD-10-CM | POA: Diagnosis not present

## 2018-03-10 DIAGNOSIS — K589 Irritable bowel syndrome without diarrhea: Secondary | ICD-10-CM | POA: Diagnosis not present

## 2018-03-10 DIAGNOSIS — I5032 Chronic diastolic (congestive) heart failure: Secondary | ICD-10-CM | POA: Diagnosis not present

## 2018-03-10 DIAGNOSIS — M545 Low back pain: Secondary | ICD-10-CM | POA: Diagnosis not present

## 2018-03-10 DIAGNOSIS — I252 Old myocardial infarction: Secondary | ICD-10-CM | POA: Diagnosis not present

## 2018-03-10 DIAGNOSIS — G8929 Other chronic pain: Secondary | ICD-10-CM | POA: Diagnosis not present

## 2018-03-10 DIAGNOSIS — N200 Calculus of kidney: Secondary | ICD-10-CM | POA: Diagnosis not present

## 2018-03-10 DIAGNOSIS — E785 Hyperlipidemia, unspecified: Secondary | ICD-10-CM | POA: Diagnosis not present

## 2018-03-10 DIAGNOSIS — K5712 Diverticulitis of small intestine without perforation or abscess without bleeding: Secondary | ICD-10-CM | POA: Diagnosis not present

## 2018-03-12 DIAGNOSIS — I252 Old myocardial infarction: Secondary | ICD-10-CM | POA: Diagnosis not present

## 2018-03-12 DIAGNOSIS — I130 Hypertensive heart and chronic kidney disease with heart failure and stage 1 through stage 4 chronic kidney disease, or unspecified chronic kidney disease: Secondary | ICD-10-CM | POA: Diagnosis not present

## 2018-03-12 DIAGNOSIS — I2510 Atherosclerotic heart disease of native coronary artery without angina pectoris: Secondary | ICD-10-CM | POA: Diagnosis not present

## 2018-03-12 DIAGNOSIS — E1122 Type 2 diabetes mellitus with diabetic chronic kidney disease: Secondary | ICD-10-CM | POA: Diagnosis not present

## 2018-03-12 DIAGNOSIS — N183 Chronic kidney disease, stage 3 (moderate): Secondary | ICD-10-CM | POA: Diagnosis not present

## 2018-03-12 DIAGNOSIS — I5032 Chronic diastolic (congestive) heart failure: Secondary | ICD-10-CM | POA: Diagnosis not present

## 2018-03-14 DIAGNOSIS — I5032 Chronic diastolic (congestive) heart failure: Secondary | ICD-10-CM | POA: Diagnosis not present

## 2018-03-14 DIAGNOSIS — E1122 Type 2 diabetes mellitus with diabetic chronic kidney disease: Secondary | ICD-10-CM | POA: Diagnosis not present

## 2018-03-14 DIAGNOSIS — I2510 Atherosclerotic heart disease of native coronary artery without angina pectoris: Secondary | ICD-10-CM | POA: Diagnosis not present

## 2018-03-14 DIAGNOSIS — I130 Hypertensive heart and chronic kidney disease with heart failure and stage 1 through stage 4 chronic kidney disease, or unspecified chronic kidney disease: Secondary | ICD-10-CM | POA: Diagnosis not present

## 2018-03-14 DIAGNOSIS — N183 Chronic kidney disease, stage 3 (moderate): Secondary | ICD-10-CM | POA: Diagnosis not present

## 2018-03-14 DIAGNOSIS — I252 Old myocardial infarction: Secondary | ICD-10-CM | POA: Diagnosis not present

## 2018-03-29 DIAGNOSIS — M1711 Unilateral primary osteoarthritis, right knee: Secondary | ICD-10-CM | POA: Insufficient documentation

## 2018-04-04 ENCOUNTER — Other Ambulatory Visit: Payer: Self-pay | Admitting: Cardiovascular Disease

## 2018-04-10 ENCOUNTER — Encounter: Payer: Medicare Other | Admitting: Cardiovascular Disease

## 2018-04-24 ENCOUNTER — Ambulatory Visit (INDEPENDENT_AMBULATORY_CARE_PROVIDER_SITE_OTHER): Payer: Medicare Other | Admitting: *Deleted

## 2018-04-24 ENCOUNTER — Other Ambulatory Visit: Payer: Self-pay

## 2018-04-24 DIAGNOSIS — I429 Cardiomyopathy, unspecified: Secondary | ICD-10-CM

## 2018-04-24 LAB — CUP PACEART REMOTE DEVICE CHECK
Battery Remaining Longevity: 144 mo
Battery Remaining Percentage: 100 %
Brady Statistic RV Percent Paced: 1 %
Date Time Interrogation Session: 20200408113900
HighPow Impedance: 43 Ohm
Implantable Lead Implant Date: 20090120
Implantable Lead Location: 753860
Implantable Lead Model: 157
Implantable Lead Serial Number: 138237
Implantable Pulse Generator Implant Date: 20160126
Lead Channel Impedance Value: 686 Ohm
Lead Channel Pacing Threshold Amplitude: 0.8 V
Lead Channel Pacing Threshold Pulse Width: 0.5 ms
Lead Channel Setting Pacing Amplitude: 2.2 V
Lead Channel Setting Pacing Pulse Width: 0.5 ms
Lead Channel Setting Sensing Sensitivity: 0.6 mV
Pulse Gen Serial Number: 191956

## 2018-05-03 ENCOUNTER — Encounter: Payer: Self-pay | Admitting: Cardiology

## 2018-05-03 NOTE — Progress Notes (Signed)
Remote ICD transmission.   

## 2018-05-06 ENCOUNTER — Telehealth: Payer: Self-pay | Admitting: Cardiovascular Disease

## 2018-05-06 ENCOUNTER — Other Ambulatory Visit: Payer: Self-pay

## 2018-05-06 ENCOUNTER — Ambulatory Visit (INDEPENDENT_AMBULATORY_CARE_PROVIDER_SITE_OTHER): Payer: Medicare Other | Admitting: Family Medicine

## 2018-05-06 DIAGNOSIS — G63 Polyneuropathy in diseases classified elsewhere: Secondary | ICD-10-CM

## 2018-05-06 DIAGNOSIS — E7800 Pure hypercholesterolemia, unspecified: Secondary | ICD-10-CM

## 2018-05-06 DIAGNOSIS — I10 Essential (primary) hypertension: Secondary | ICD-10-CM

## 2018-05-06 DIAGNOSIS — R0789 Other chest pain: Secondary | ICD-10-CM

## 2018-05-06 DIAGNOSIS — E0843 Diabetes mellitus due to underlying condition with diabetic autonomic (poly)neuropathy: Secondary | ICD-10-CM

## 2018-05-06 DIAGNOSIS — E559 Vitamin D deficiency, unspecified: Secondary | ICD-10-CM

## 2018-05-06 DIAGNOSIS — N183 Chronic kidney disease, stage 3 unspecified: Secondary | ICD-10-CM

## 2018-05-06 DIAGNOSIS — I5032 Chronic diastolic (congestive) heart failure: Secondary | ICD-10-CM

## 2018-05-06 DIAGNOSIS — E889 Metabolic disorder, unspecified: Secondary | ICD-10-CM

## 2018-05-06 DIAGNOSIS — E1140 Type 2 diabetes mellitus with diabetic neuropathy, unspecified: Secondary | ICD-10-CM

## 2018-05-06 DIAGNOSIS — D509 Iron deficiency anemia, unspecified: Secondary | ICD-10-CM | POA: Diagnosis not present

## 2018-05-06 DIAGNOSIS — I4891 Unspecified atrial fibrillation: Secondary | ICD-10-CM

## 2018-05-06 DIAGNOSIS — E119 Type 2 diabetes mellitus without complications: Secondary | ICD-10-CM | POA: Diagnosis not present

## 2018-05-06 DIAGNOSIS — R531 Weakness: Secondary | ICD-10-CM

## 2018-05-06 NOTE — Patient Instructions (Signed)
Patient should continue to follow-up with her cardiologist. Continue to follow-up with urology as needed. She should drink plenty of water and fluids and stay well-hydrated She should practice good hand and respiratory hygiene as much as possible and avoid crowds of people and have someone else to do her shopping for her.

## 2018-05-06 NOTE — Telephone Encounter (Signed)
Spoke with pt who states she has had L-side chest tightness accompanied by tightness and a tickling sensation in her throat and, at times, SOB and/or presyncope. She states she has had chest tightness since last week and that her most recent episodes were Saturday and yesterday. Per pt, yesterday's episode was worse than Saturday's. She states that these episodes last all day, and are relieved by 1 tab of nitro. She states that tightness is 7 at its worst on a scale from 1-10. She denies current chest tightness but states that she did have 'a little bit of tightness' this morning and that it lasted for about an hour. She states that she does not know if that will be the case for the rest of the day. Pt unable to provide any vitals but states that a family member, who is a Marine scientist, comes by from time to time to check her BP and HR and that her systolic in 517G and HR in 50s. She mentioned  That she has had chills all day and that this started last week, but it may be b/c she is on Eliquis and that she does generally feel cold all the time. She denies COVID-19 symptoms. Pt has stayed in bed for most of the day and feels that stress over the COVID-19 crisis may play a role in how she is feeling overall. Advised her of heart attack symptoms to monitor for and also advised her that if chest tightness persists/increases in severity and is unrelieved by nitro she should call 911. Pt verbalized understanding

## 2018-05-06 NOTE — Telephone Encounter (Signed)
These are not new symptoms for [name]. Please remind her to call for urgent medical assistance if the chest discomfort lasts for > 25 minutes and is not relieved by 3 consecutive SL NTG every 5 min. Thank you MCr

## 2018-05-06 NOTE — Telephone Encounter (Signed)
New Message   Pt c/o of Chest Pain: STAT if CP now or developed within 24 hours  1. Are you having CP right now? Pt says she has a tightness in her chest and up in her throat. She said she has a history of Afib   2. Are you experiencing any other symptoms (ex. SOB, nausea, vomiting, sweating)? No, says she's had chills   3. How long have you been experiencing CP? She said all last week and Saturday and yesterday was worse. She says today it seems a little better   4. Is your CP continuous or coming and going? Comes and goes   5. Have you taken Nitroglycerin? Yes, used Sunday night  ?

## 2018-05-06 NOTE — Addendum Note (Signed)
Addended by: Zannie Cove on: 05/06/2018 04:50 PM   Modules accepted: Orders

## 2018-05-06 NOTE — Progress Notes (Signed)
Virtual Visit Via telephone Note I connected with@ on 05/06/18 by telephone and verified that I am speaking with the correct person or authorized healthcare agent using two identifiers. [name] is currently located at home and there are no unauthorized people in close proximity. I completed this visit while in a private location in my home .  I connected to the patient by telephone and verified that I was speaking with the correct person.  This visit type was conducted due to national recommendations for restrictions regarding the COVID-19 Pandemic (e.g. social distancing).  This format is felt to be most appropriate for this patient at this time.  All issues noted in this document were discussed and addressed.  No physical exam was performed.    I discussed the limitations, risks, security and privacy concerns of performing an evaluation and management service by telephone and the availability of in person appointments. I also discussed with the patient that there may be a patient responsible charge related to this service. The patient expressed understanding and agreed to proceed.   Date:  05/06/2018    ID:  [name]      [birth year]        [id number]   Patient Care Team Patient Care Team: Chipper Herb, MD as PCP - General (Family Medicine) Croitoru, Dani Gobble, MD as PCP - Cardiology (Cardiology) Zadie Rhine Clent Demark, MD as Consulting Physician (Ophthalmology) Paralee Cancel, MD as Consulting Physician (Orthopedic Surgery) Sanda Klein, MD as Consulting Physician (Cardiology) Irene Shipper, MD as Consulting Physician (Gastroenterology)  Reason for Visit: Primary Care Follow-up     History of Present Illness & Review of Systems:     [name] is a 85 y.o. year old female primary care patient that presents today for a telehealth visit.  The patient indicates that she has not been feeling good at all and that her heart had been acting up.  She has a history of  atrial fibrillation.  She has had tightness in her chest that seems to go up into her throat and has been going on for about 1 week and has been worse in the past few days.  She has not called the cardiologist about this and plans to do that when she gets off the phone with me.  She was in the hospital back in January and as result of that was placed on some Lomotil and Pepcid AC.  She is doing better with her stomach.  Her most recent creatinine was 1.46.  Only the chloride was slightly elevated.  She also feels faint feeling during this episode with her heart acting up.  She has been taking some extra nitroglycerin.  There is an aching sensation in her chest and pressure sensation.  She does have some shortness of breath associated with it but has no PND.  Her stomach is doing better since she has been taking Lomotil and Pepcid.  Her loose bowel movements are less often.  She is not having any trouble passing her water and she does not describe any edema.  She does have some tingling in her left leg.  She does have or is in need of seeing the orthopedist about getting an injection in her knee and she plans to call him to see when it would be safe for her to come there.  She does not check any blood sugars regularly at home and I told her that she should do this.  Her  blood pressures at home generally run between 120 and 130 over the 80s but recently it was as high as 156 for the top number.  Her heart rate is been running in the 50s.  She has not had any fever of note.  He does not need refills on any of her medicines.  Review of systems as stated otherwise negative for body systems left unmentioned.   The patient does not have symptoms concerning for COVID-19 infection (fever, chills, cough, or new shortness of breath).      Current Medications (Verified) Allergies as of 05/06/2018      Reactions   Sotalol Other (See Comments)   Torsades   Bactrim [sulfamethoxazole-trimethoprim]    Itchy rash    Ciprofloxacin Nausea Only   Actos [pioglitazone] Swelling   Adhesive [tape] Other (See Comments)   Causes sores   Contrast Media [iodinated Diagnostic Agents] Other (See Comments)   Headache (no action/pre-med required)   Latex Rash   Lipitor [atorvastatin] Other (See Comments)   myalgia      Medication List       Accurate as of May 06, 2018  2:36 PM. Always use your most recent med list.        amLODipine 2.5 MG tablet Commonly known as:  NORVASC Take 1 tablet (2.5 mg total) by mouth daily.   CENTRUM SILVER ULTRA WOMENS PO Take 1 tablet by mouth daily.   CITRACAL + D PO Take 1 tablet by mouth daily.   diphenoxylate-atropine 2.5-0.025 MG tablet Commonly known as:  Lomotil Take 1 tablet by mouth every 8 (eight) hours as needed for diarrhea or loose stools.   Eliquis 5 MG Tabs tablet Generic drug:  apixaban Take 1 tablet (5 mg total) by mouth 2 (two) times daily.   famotidine 20 MG tablet Commonly known as:  PEPCID Take 1 tablet (20 mg total) by mouth at bedtime.   lisinopril 20 MG tablet Commonly known as:  ZESTRIL Take 1 tablet (20 mg total) by mouth 2 (two) times daily.   metoprolol tartrate 50 MG tablet Commonly known as:  LOPRESSOR TAKE  (1)  TABLET TWICE A DAY.   nitroGLYCERIN 0.4 MG SL tablet Commonly known as:  NITROSTAT Place 1 tablet (0.4 mg total) under the tongue every 5 (five) minutes as needed for chest pain.   pantoprazole 40 MG tablet Commonly known as:  PROTONIX Take 1 tablet (40 mg total) by mouth daily.   PARoxetine 20 MG tablet Commonly known as:  PAXIL TAKE (1) TABLET DAILY IN THE MORNING.   rosuvastatin 40 MG tablet Commonly known as:  CRESTOR Take 0.5 tablets (20 mg total) by mouth daily.   torsemide 20 MG tablet Commonly known as:  DEMADEX Take 20 mg by mouth daily as needed (edema).   Vitamin D 1000 units capsule Take 2,000 Units by mouth daily.           Allergies (Verified)    Sotalol; Bactrim  [sulfamethoxazole-trimethoprim]; Ciprofloxacin; Actos [pioglitazone]; Adhesive [tape]; Contrast media [iodinated diagnostic agents]; Latex; and Lipitor [atorvastatin]  Past Medical History Past Medical History:  Diagnosis Date  . AICD (automatic cardioverter/defibrillator) present   . Allergy    SESONAL  . Anxiety   . ARTHRITIS   . Arthritis   . Atrial fibrillation ([place])   . CAD (coronary artery disease)    a. history of cardiac arrest 1993;  b. s/p LAD/LCX stenting in 2003;  c. 07/2011 Cath: LM nl, LAD patent stent, D1 80ost, LCX patent  stent, RCA min irregs;  d. 04/2012 MV: EF 66%, no ishcemia;  e. 12/2013 Echo: Ef 55%, no rwma, Gr 1 DD, triv AI/MR, mildly dil LA;  f. 12/2013 Lexi MV: intermediate risk - apical ischemia and inf/infsept fixed defect, ? artifactual.  . CAD in native artery 12/31/2013   Previous stents to LAD and Ramus patent on cath today 12/31/13 also There is severe disease in the ostial first diagonal which is unchanged from most recent cardiac catheterization. The right coronary artery could not be engaged selectively but nonselective angiography showed no significant disease in the proximal and midsegment.     . Cataract    DENIES  . Chronic back pain   . Chronic sinus bradycardia   . CKD (chronic kidney disease), stage III ([place])   . Clotting disorder (HCC)    DVT  . Diabetes mellitus without complication ([place])    DENIES  . Diverticulosis of colon (without mention of hemorrhage) 2007   Colonoscopy   . Esophageal stricture    a. 2012 s/p dil.  . Esophagitis, unspecified    a. 2012 EGD  . EXTERNAL HEMORRHOIDS   . GERD (gastroesophageal reflux disease)    omeprazole  . H/O hiatal hernia   . Hiatal hernia    a. 2012 EGD.  Marland Kitchen History of DVT in the past, not on Coumadin now    left  leg  . HYPERCHOLESTEROLEMIA   . Hypertension   . ICD (implantable cardiac defibrillator) in place    a. s/p initial ICD in 1993 in setting of cardiac arrest;  b. 01/2007 gen change:  Guidant T135 Vitality DS VR single lead ICD.  . Macular degeneration    gets injecton in eye every 5 weeks- last injection - 05/03/2013   . Myocardial infarction ([place]) 1993  . Nephrolithiasis, just saw Dr [name]   . Orthostatic hypotension   . Peripheral vascular disease ([place])    ???  . Unspecified gastritis and gastroduodenitis without mention of hemorrhage    a. 2003 EGD->not noted on 2012 EGD.     Past Surgical History:  Procedure Laterality Date  . BREAST BIOPSY Left 11/2013  . CARDIAC CATHETERIZATION    . CARDIAC CATHETERIZATION  01/01/15   patent stents -there is severe disease in ostial 1st diag but without change.   Marland Kitchen CARDIAC DEFIBRILLATOR PLACEMENT  02/05/2007   Guidant  . CARDIOVASCULAR STRESS TEST  12/30/2013   abnormal  . COLONOSCOPY    . coronary stents     . CYSTOSCOPY WITH URETEROSCOPY Right 05/20/2013   Procedure: CYSTOSCOPY, RIGHT URETEROSCOPY STONE EXTRACTION, Insertion of right DOUBLE J STENT ;  Surgeon: Irine Seal, MD;  Location: [facility];  Service: Urology;  Laterality: Right;  . CYSTOSCOPY WITH URETEROSCOPY AND STENT PLACEMENT N/A 06/03/2013   Procedure: SECOND LOOK CYSTOSCOPY WITH URETEROSCOPY  HOLMIUM LASER LITHO AND STONE EXTRACTION Sammie Bench ;  Surgeon: Malka So, MD;  Location: [facility];  Service: Urology;  Laterality: N/A;  . GIVENS CAPSULE STUDY N/A 10/30/2016   Procedure: GIVENS CAPSULE STUDY;  Surgeon: Irene Shipper, MD;  Location: [facility];  Service: Endoscopy;  Laterality: N/A;  NEEDS TO BE ADMITTED FOR OBSERVATION PT. HAS DEFIB  . HEMORROIDECTOMY  80's  . HOLMIUM LASER APPLICATION Right 09/17/1192   Procedure: HOLMIUM LASER APPLICATION;  Surgeon: Irine Seal, MD;  Location: [facility];  Service: Urology;  Laterality: Right;  . HYSTEROSCOPY W/D&C  09/02/2010   Procedure: DILATATION AND CURETTAGE (D&C) /HYSTEROSCOPY;  Surgeon: Ralene Bathe  [place];  Location: [facility];  Service: Gynecology;  Laterality: N/A;  Dilation and Curettage with Hysteroscopy and  Polypectomy  . IMPLANTABLE CARDIOVERTER DEFIBRILLATOR (ICD) GENERATOR CHANGE N/A 02/10/2014   Procedure: ICD GENERATOR CHANGE;  Surgeon: Sanda Klein, MD;  Location: [facility];  Service: Cardiovascular;  Laterality: N/A;  . LEFT HEART CATHETERIZATION WITH CORONARY ANGIOGRAM N/A 07/28/2011   Procedure: LEFT HEART CATHETERIZATION WITH CORONARY ANGIOGRAM;  Surgeon: Lorretta Harp, MD;  Location: [facility];  Service: Cardiovascular;  Laterality: N/A;  . LEFT HEART CATHETERIZATION WITH CORONARY ANGIOGRAM N/A 12/31/2013   Procedure: LEFT HEART CATHETERIZATION WITH CORONARY ANGIOGRAM;  Surgeon: Wellington Hampshire, MD;  Location: [facility];  Service: Cardiovascular;  Laterality: N/A;    Social History   Socioeconomic History  . Marital status: Widowed    Spouse name: Not on file  . Number of children: Not on file  . Years of education: Not on file  . Highest education level: Not on file  Occupational History  . Not on file  Social Needs  . Financial resource strain: Not on file  . Food insecurity:    Worry: Not on file    Inability: Not on file  . Transportation needs:    Medical: Not on file    Non-medical: Not on file  Tobacco Use  . Smoking status: Never Smoker  . Smokeless tobacco: Never Used  Substance and Sexual Activity  . Alcohol use: No  . Drug use: No  . Sexual activity: Never    Birth control/protection: Post-menopausal  Lifestyle  . Physical activity:    Days per week: Not on file    Minutes per session: Not on file  . Stress: Not on file  Relationships  . Social connections:    Talks on phone: Not on file    Gets together: Not on file    Attends religious service: Not on file    Active member of club or organization: Not on file    Attends meetings of clubs or organizations: Not on file    Relationship status: Not on file  Other Topics Concern  . Not on file  Social History Narrative  . Not on file     Family History  Problem Relation Age of Onset  .  Stroke Mother   . Other Mother        brain tumor  . Other Father        MI  . Heart attack Father   . Stroke Sister   . Macular degeneration Sister   . Diabetes Daughter   . Cancer Daughter        ovarian  . Colon polyps Daughter   . Atrial fibrillation Sister   . Hyperlipidemia Sister   . Osteoporosis Sister   . Stroke Sister   . Uterine cancer Sister   . Heart attack Brother   . Heart disease Brother   . Asthma Brother   . Breast cancer Neg Hx       Labs/Other Tests and Data Reviewed:    Wt Readings from Last 3 Encounters:  02/12/18 159 lb (72.1 kg)  02/07/18 160 lb 4.4 oz (72.7 kg)  01/30/18 152 lb (68.9 kg)   Temp Readings from Last 3 Encounters:  02/12/18 (!) 97 F (36.1 C) (Oral)  02/07/18 97.7 F (36.5 C) (Oral)  01/30/18 (!) 96.6 F (35.9 C) (Oral)   BP Readings from Last 3 Encounters:  02/12/18 138/70  02/07/18 115/70  01/30/18 116/70  Pulse Readings from Last 3 Encounters:  02/12/18 (!) 58  02/07/18 69  01/30/18 (!) 49     Lab Results  Component Value Date   HGBA1C 7.1 (H) 02/05/2018   HGBA1C 7.2 (H) 12/31/2017   HGBA1C 7.2 (H) 08/15/2017   Lab Results  Component Value Date   LDLCALC 55 12/31/2017   CREATININE 1.46 (H) 02/12/2018       Chemistry      Component Value Date/Time   NA 140 02/12/2018 0911   K 4.1 02/12/2018 0911   CL 109 (H) 02/12/2018 0911   CO2 15 (L) 02/12/2018 0911   BUN 22 02/12/2018 0911   CREATININE 1.46 (H) 02/12/2018 0911   CREATININE 1.52 (H) 07/31/2012 1019      Component Value Date/Time   CALCIUM 9.2 02/12/2018 0911   ALKPHOS 65 02/06/2018 0446   AST 14 (L) 02/06/2018 0446   ALT 16 02/06/2018 0446   BILITOT 0.5 02/06/2018 0446   BILITOT 0.5 12/31/2017 1458         OBSERVATIONS/ OBJECTIVE:     The patient gives blood pressure readings have been generally running in the 1 20-1 30 range over the 80s but has been as high as 156.  She has not had any edema and her current problems with her loose  stools seems to be improved with taking Lomotil and Pepcid AC.  She said her heart rate is been running in the 50s.  Physical exam deferred due to nature of telephonic visit.  ASSESSMENT & PLAN    Time:   Today, I have spent 32 minutes with the patient via telephone discussing the above including Covid precautions.     Visit Diagnoses: 1. Essential hypertension -For now, she should continue with her current medication.  2. Type 2 diabetes mellitus with diabetic neuropathy, without long-term current use of insulin ([place]) -She has not been checking any blood sugars at home and will try to check more and continue with her therapeutic lifestyle changes.  3. Iron deficiency anemia, unspecified iron deficiency anemia type -Plan to get CBC with blood work in 4 to 6 weeks  4. Hyperlipidemia -Continue with Crestor as doing and therapeutic lifestyle changes  5. Vitamin D deficiency -Continue with current vitamin D replacement  6. CKD (chronic kidney disease) stage 3, GFR 30-59 ml/min (HCC) -Patient needs lab work done to check creatinine.  7. Chronic diastolic heart failure ([place]) -Follow-up with cardiology as planned  8. Peripheral neuropathy due to disorder of metabolism (HCC) -No complaints with neuropathy today.  9. Diabetes mellitus due to underlying condition with diabetic autonomic neuropathy, without long-term current use of insulin (HCC) -Check blood sugars more frequently get fasting blood sugars and prior to eating blood sugars.  10. Atrial fibrillation, unspecified type [facility]) -Patient will call and follow-up with Dr. Sallyanne Kuster.  11. Diabetes mellitus type 2, diet-controlled ([place]) -Continue therapeutic lifestyle changes  12.  Chest pressure -This is been especially bad with it getting even worse since Friday and Saturday of this past week.  Patient will call her cardiologist and discussed this with his nurse so that a decision can be made about whether she needs to be seen  or not.  She in fact is a little bit better today. -During this episode recently she has been taking more nitroglycerin.   13.  Weakness -The weakness has been associated with the chest pressure and heart acting up. -Follow-up with cardiology as planned.  Patient Instructions  Patient should continue to follow-up with  her cardiologist. Continue to follow-up with urology as needed. She should drink plenty of water and fluids and stay well-hydrated She should practice good hand and respiratory hygiene as much as possible and avoid crowds of people and have someone else to do her shopping for her.       The above assessment and management plan was discussed with the patient. The patient verbalized understanding of and has agreed to the management plan. Patient is aware to call the clinic if symptoms persist or worsen. Patient is aware when to return to the clinic for a follow-up visit. Patient educated on when it is appropriate to go to the emergency department.    Chipper Herb, MD [address], [place], [place] [address] Ph [phone]   Arrie Senate MD

## 2018-05-07 NOTE — Telephone Encounter (Signed)
Spoke to patient Dr.Croitoru's recommendation given.Stated she feels better today.Advised to keep appointment as planned and call sooner if needed.

## 2018-05-09 ENCOUNTER — Telehealth: Payer: Self-pay | Admitting: Cardiovascular Disease

## 2018-05-09 NOTE — Telephone Encounter (Signed)
Pt called in today to add some more information.. pt stated still having the discomfort.

## 2018-05-09 NOTE — Telephone Encounter (Signed)
Pt called to report that she was awaken this morning from a possible ICD shock... she says she is not sure but it still having just a few palpitations.. she denies knowing how she was feeling prior since she was sleeping but after she was a little anxious and tired.. the palpitations did not start for a few hours later.. she is not dizzy and feels "okay" now... will forward to the device clinic for their review and then to Dr. Sallyanne Kuster..  Pt unaware of her VS today.

## 2018-05-10 NOTE — Telephone Encounter (Signed)
Transmission received 05/10/2018

## 2018-05-10 NOTE — Telephone Encounter (Signed)
No shock. No VT. She often feels this way when she has atrial fibrillation with controlled ventricular rate (which we have documented in the past by ECG during symptoms, cannot see on her device since she has a single chamber device). Usually passes in a few hours/1 day. We have reviewed antiarrhythmic options before and decided best not to treat it. Please reassure that nothing new is occurring. Thanks [employer]

## 2018-05-10 NOTE — Telephone Encounter (Signed)
I called the pt back because we still have not received the transmission in [employer]. I gave her the number to [employer] to get further help. I told her as soon as the nurse get that transmission she will give her a call back.

## 2018-05-10 NOTE — Telephone Encounter (Signed)
Pt verbalized understanding of [name] recommendation and says she is feeling much better today.

## 2018-05-10 NOTE — Telephone Encounter (Signed)
Normal device function, no episodes.  Chanetta Marshall, NP 05/10/2018 10:21 AM

## 2018-05-10 NOTE — Telephone Encounter (Signed)
I called the pt to get her to send a manual transmission. She agreed to do one. I told her I will give her a call back if I do not see the transmission in 30 minutes.

## 2018-05-14 ENCOUNTER — Encounter: Payer: Self-pay | Admitting: Family Medicine

## 2018-05-14 ENCOUNTER — Other Ambulatory Visit: Payer: Self-pay

## 2018-05-14 ENCOUNTER — Other Ambulatory Visit: Payer: Self-pay | Admitting: Cardiovascular Disease

## 2018-05-14 ENCOUNTER — Ambulatory Visit (INDEPENDENT_AMBULATORY_CARE_PROVIDER_SITE_OTHER): Payer: Medicare Other | Admitting: Family Medicine

## 2018-05-14 ENCOUNTER — Other Ambulatory Visit: Payer: Self-pay | Admitting: Family Medicine

## 2018-05-14 DIAGNOSIS — N3090 Cystitis, unspecified without hematuria: Secondary | ICD-10-CM

## 2018-05-14 LAB — URINALYSIS, COMPLETE
Bilirubin, UA: NEGATIVE
Glucose, UA: NEGATIVE
Ketones, UA: NEGATIVE
Leukocytes,UA: NEGATIVE
Nitrite, UA: NEGATIVE
Protein,UA: NEGATIVE
Specific Gravity, UA: 1.015 (ref 1.005–1.030)
Urobilinogen, Ur: 0.2 mg/dL (ref 0.2–1.0)
pH, UA: 5.5 (ref 5.0–7.5)

## 2018-05-14 LAB — MICROSCOPIC EXAMINATION
Bacteria, UA: NONE SEEN
Renal Epithel, UA: NONE SEEN /hpf

## 2018-05-14 NOTE — Progress Notes (Signed)
Subjective:    Patient ID: [name], female    DOB: 1931-04-05, 85 y.o.   MRN: [id number]   HPI: [name] is a 85 y.o. female presenting for burning with urination and frequency for several days. Denies fever . No flank pain. No nausea, vomiting. OVerall feeling malaise & fatigue.    Depression screen [facility] 2/9 02/12/2018 01/30/2018 12/31/2017 08/23/2017 08/15/2017  Decreased Interest 0 0 0 0 0  Down, Depressed, Hopeless 0 0 0 0 0  PHQ - 2 Score 0 0 0 0 0  Altered sleeping 0 - - - -  Tired, decreased energy 0 - - - -  Change in appetite 0 - - - -  Feeling bad or failure about yourself  0 - - - -  Trouble concentrating 0 - - - -  Moving slowly or fidgety/restless 0 - - - -  Suicidal thoughts 0 - - - -  PHQ-9 Score 0 - - - -  Some recent data might be hidden     Relevant past medical, surgical, family and social history reviewed and updated as indicated.  Interim medical history since our last visit reviewed. Allergies and medications reviewed and updated.  ROS:  Review of Systems  Constitutional: Negative for chills, diaphoresis and fever.  HENT: Negative for congestion.   Eyes: Negative for visual disturbance.  Respiratory: Negative for cough and shortness of breath.   Cardiovascular: Negative for chest pain and palpitations.  Gastrointestinal: Negative for constipation, diarrhea and nausea.  Genitourinary: Positive for dysuria, frequency and urgency. Negative for decreased urine volume, flank pain, hematuria, menstrual problem and pelvic pain.  Musculoskeletal: Negative for arthralgias and joint swelling.  Skin: Negative for rash.  Neurological: Negative for dizziness and numbness.     Social History   Tobacco Use  Smoking Status Never Smoker  Smokeless Tobacco Never Used       Objective:     Wt Readings from Last 3 Encounters:  02/12/18 159 lb (72.1 kg)  02/07/18 160 lb 4.4 oz (72.7 kg)  01/30/18 152 lb (68.9 kg)     Exam deferred. Pt.  [place] due to COVID 19. Phone visit performed.   Assessment & Plan:   1. Cystitis     No orders of the defined types were placed in this encounter.   Orders Placed This Encounter  Procedures  . Urine Culture  . Urinalysis, Complete      Diagnoses and all orders for this visit:  Cystitis -     Urinalysis, Complete -     Urine Culture    Virtual Visit via telephone Note  I discussed the limitations, risks, security and privacy concerns of performing an evaluation and management service by telephone and the availability of in person appointments. The patient was identified with two identifiers. Pt.expressed understanding and agreed to proceed. Pt. Is at home. Dr. Livia Snellen is in his office.  Follow Up Instructions:   I discussed the assessment and treatment plan with the patient. The patient was provided an opportunity to ask questions and all were answered. The patient agreed with the plan and demonstrated an understanding of the instructions.   The patient was advised to call back or seek an in-person evaluation if the symptoms worsen or if the condition fails to improve as anticipated.  Visit started: 1:50 Call ended:  2:00 Total minutes including chart review and phone contact time: 15   Follow up plan: Return if symptoms worsen or fail to improve.  Claretta Fraise, MD [place]

## 2018-05-15 LAB — URINE CULTURE

## 2018-05-16 ENCOUNTER — Other Ambulatory Visit: Payer: Self-pay | Admitting: *Deleted

## 2018-05-16 MED ORDER — DIPHENOXYLATE-ATROPINE 2.5-0.025 MG PO TABS: 1.0000 | ORAL_TABLET | Freq: Three times a day (TID) | ORAL | 1 refills | Status: DC | PRN

## 2018-05-30 ENCOUNTER — Other Ambulatory Visit: Payer: Self-pay

## 2018-06-06 ENCOUNTER — Telehealth: Payer: Self-pay | Admitting: Cardiovascular Disease

## 2018-06-06 NOTE — Telephone Encounter (Signed)
Patient on combination of famotidine plus protonix from ED appt on 01/2018.   Patent should continue protonix at this time and contact PCP if changes needed

## 2018-06-06 NOTE — Telephone Encounter (Signed)
They would like to know if pt needs to be on another H2 INHIBITOR       PEPCID is on back order and ZANTAC has been recalled  Patient Is on protonix  Please clarify keeping pt on this medication.   Please give pharm a call back they have some questions.

## 2018-06-07 MED ORDER — PANTOPRAZOLE SODIUM 40 MG PO TBEC: 40.0000 mg | DELAYED_RELEASE_TABLET | Freq: Every day | ORAL | 3 refills | Status: DC

## 2018-06-07 NOTE — Telephone Encounter (Signed)
S/w pharmacist he states that he was told to stpothe famotidine per Dr Sallyanne Kuster and take only the protonix 40mg . He will fill and notify pt

## 2018-06-07 NOTE — Telephone Encounter (Signed)
S/w pharmacy states that she does not have a message that anyone called from there. Relayed message to pharmacy and she states tht pt needs a refill of the protonix. 20mg  is out of stock so

## 2018-06-07 NOTE — Telephone Encounter (Signed)
Note con't.. Looks like she has

## 2018-06-07 NOTE — Addendum Note (Signed)
Addended by: Waylan Rocher on: 06/07/2018 10:26 AM   Modules accepted: Orders

## 2018-06-11 ENCOUNTER — Telehealth: Payer: Self-pay

## 2018-06-11 NOTE — Telephone Encounter (Signed)
Patient cancelled appointment.

## 2018-06-12 ENCOUNTER — Encounter: Payer: Medicare Other | Admitting: Cardiovascular Disease

## 2018-06-24 ENCOUNTER — Telehealth: Payer: Self-pay | Admitting: Family Medicine

## 2018-06-24 NOTE — Telephone Encounter (Signed)
Pt is wanting to speak to Roselyn Reef about an apt

## 2018-06-24 NOTE — Telephone Encounter (Signed)
Called pt.

## 2018-07-02 ENCOUNTER — Other Ambulatory Visit: Payer: Self-pay | Admitting: Cardiovascular Disease

## 2018-07-11 ENCOUNTER — Other Ambulatory Visit: Payer: Self-pay

## 2018-07-11 ENCOUNTER — Telehealth: Payer: Self-pay | Admitting: Family Medicine

## 2018-07-12 ENCOUNTER — Encounter: Payer: Self-pay | Admitting: Family Medicine

## 2018-07-12 ENCOUNTER — Ambulatory Visit (INDEPENDENT_AMBULATORY_CARE_PROVIDER_SITE_OTHER): Payer: Medicare Other | Admitting: Family Medicine

## 2018-07-12 VITALS — BP 129/74 | HR 50 | Temp 96.9°F | Ht 63.0 in | Wt 153.0 lb

## 2018-07-12 DIAGNOSIS — E1159 Type 2 diabetes mellitus with other circulatory complications: Secondary | ICD-10-CM

## 2018-07-12 DIAGNOSIS — G8929 Other chronic pain: Secondary | ICD-10-CM | POA: Diagnosis not present

## 2018-07-12 DIAGNOSIS — I10 Essential (primary) hypertension: Secondary | ICD-10-CM

## 2018-07-12 DIAGNOSIS — D509 Iron deficiency anemia, unspecified: Secondary | ICD-10-CM

## 2018-07-12 DIAGNOSIS — N183 Chronic kidney disease, stage 3 unspecified: Secondary | ICD-10-CM

## 2018-07-12 DIAGNOSIS — M25561 Pain in right knee: Secondary | ICD-10-CM

## 2018-07-12 DIAGNOSIS — E1169 Type 2 diabetes mellitus with other specified complication: Secondary | ICD-10-CM

## 2018-07-12 DIAGNOSIS — E119 Type 2 diabetes mellitus without complications: Secondary | ICD-10-CM | POA: Diagnosis not present

## 2018-07-12 DIAGNOSIS — M25562 Pain in left knee: Secondary | ICD-10-CM

## 2018-07-12 DIAGNOSIS — Z95810 Presence of automatic (implantable) cardiac defibrillator: Secondary | ICD-10-CM | POA: Diagnosis not present

## 2018-07-12 DIAGNOSIS — I480 Paroxysmal atrial fibrillation: Secondary | ICD-10-CM

## 2018-07-12 DIAGNOSIS — I152 Hypertension secondary to endocrine disorders: Secondary | ICD-10-CM

## 2018-07-12 DIAGNOSIS — E785 Hyperlipidemia, unspecified: Secondary | ICD-10-CM | POA: Diagnosis not present

## 2018-07-12 LAB — BAYER DCA HB A1C WAIVED: HB A1C (BAYER DCA - WAIVED): 7.1 % — ABNORMAL HIGH (ref <7.0)

## 2018-07-12 NOTE — Progress Notes (Signed)
Subjective: CC: est care, DM2, Afib, CKD PCP: [name], DO YIR:SWNIOEV EDGAR CORRIGAN is a 85 y.o. female presenting to clinic today for:  1. Diet controlled Type 2 Diabetes w/ hypertension, hyperlipidemia and CKD 3:  Patient reports that she does not monitor her sugar. Taking medication(s): Lisinopril 20 mg daily, Norvasc 5 mg daily, Crestor, Side effects: none  Last eye exam: has appt in July; has macular degeneration with 90% loss of vision in the left eye.  She is dependent upon her right eye for seeing Last foot exam: UTD Last A1c:  Lab Results  Component Value Date   HGBA1C 7.1 (H) 02/05/2018   Nephropathy screen indicated?: UTD Last flu, zoster and/or pneumovax:  Immunization History  Administered Date(s) Administered   Influenza, High Dose Seasonal PF 11/01/2015, 11/17/2016, 11/13/2017   Influenza,inj,Quad PF,6+ Mos 11/11/2013, 10/21/2014   Influenza-Unspecified 11/16/2012, 10/23/2016   Pneumococcal Conjugate-13 11/18/2012   Pneumococcal Polysaccharide-23 01/17/2003   Tdap 05/09/2010   Zoster 05/01/2011    ROS: denies chest pain, shortness of breath, dizziness.  She has had history of presyncopal episodes.  2. Afib Patient is chronically anticoagulated with Eliquis.  She does occasionally have heart palpitations.  Denies any hematochezia, melena, hematuria or abnormal vaginal bleeding.  3.  Chronic knee pain Patient with chronic bilateral knee pain right worse than left.  She is followed by orthopedics for this but notes that she has not been able to get her corticosteroid injection because of the coronavirus restrictions.  She notes that she is about a month and a half past due and wonders if she can have this done here.  She denies any history of abnormal bleeding after corticosteroid injection.  No history of complications.  She has history of arthroscopic surgery many years ago in the right knee but is no longer a candidate for surgical intervention.   Again history of CKD and chronic anticoagulation which prevents her using oral corticosteroids.  ROS: Per HPI  Allergies  Allergen Reactions   Sotalol Other (See Comments)    Torsades    Bactrim [Sulfamethoxazole-Trimethoprim]     Itchy rash   Ciprofloxacin Nausea Only   Actos [Pioglitazone] Swelling   Adhesive [Tape] Other (See Comments)    Causes sores   Contrast Media [Iodinated Diagnostic Agents] Other (See Comments)    Headache (no action/pre-med required)   Latex Rash   Lipitor [Atorvastatin] Other (See Comments)    myalgia   Past Medical History:  Diagnosis Date   AICD (automatic cardioverter/defibrillator) present    Allergy    SESONAL   Anxiety    ARTHRITIS    Arthritis    Atrial fibrillation (HCC)    CAD (coronary artery disease)    a. history of cardiac arrest 1993;  b. s/p LAD/LCX stenting in 2003;  c. 07/2011 Cath: LM nl, LAD patent stent, D1 80ost, LCX patent stent, RCA min irregs;  d. 04/2012 MV: EF 66%, no ishcemia;  e. 12/2013 Echo: Ef 55%, no rwma, Gr 1 DD, triv AI/MR, mildly dil LA;  f. 12/2013 Lexi MV: intermediate risk - apical ischemia and inf/infsept fixed defect, ? artifactual.   CAD in native artery 12/31/2013   Previous stents to LAD and Ramus patent on cath today 12/31/13 also There is severe disease in the ostial first diagonal which is unchanged from most recent cardiac catheterization. The right coronary artery could not be engaged selectively but nonselective angiography showed no significant disease in the proximal and midsegment.  Cataract    DENIES   Chronic back pain    Chronic sinus bradycardia    CKD (chronic kidney disease), stage III (HCC)    Clotting disorder ([place])    DVT   COLITIS 12/02/2007   Qualifier: Diagnosis of  By: [name], [name]     Diabetes mellitus without complication ([place])    DENIES   Diverticulosis of colon (without mention of hemorrhage) 2007   Colonoscopy    Esophageal  stricture    a. 2012 s/p dil.   Esophagitis, unspecified    a. 2012 EGD   EXTERNAL HEMORRHOIDS    GERD (gastroesophageal reflux disease)    omeprazole   H/O hiatal hernia    Hiatal hernia    a. 2012 EGD.   History of DVT in the past, not on Coumadin now    left  leg   HYPERCHOLESTEROLEMIA    Hypertension    ICD (implantable cardiac defibrillator) in place    a. s/p initial ICD in 1993 in setting of cardiac arrest;  b. 01/2007 gen change: Guidant T135 Vitality DS VR single lead ICD.   Macular degeneration    gets injecton in eye every 5 weeks- last injection - 05/03/2013    Myocardial infarction [facility]) 1993   Near syncope 05/22/2015   Nephrolithiasis, just saw Dr [name]    Orthostatic hypotension    Peripheral vascular disease ([place])    ???   RECTAL BLEEDING 12/03/2007   Qualifier: Diagnosis of  By: [name]    Unspecified gastritis and gastroduodenitis without mention of hemorrhage    a. 2003 EGD->not noted on 2012 EGD.   Unstable angina, neg MI, cath stable.maybe GI 12/30/2013    Current Outpatient Medications:    amLODipine (NORVASC) 2.5 MG tablet, Take 1 tablet (2.5 mg total) by mouth daily., Disp: 90 tablet, Rfl: 3   Calcium Citrate-Vitamin D (CITRACAL + D PO), Take 1 tablet by mouth daily., Disp: , Rfl:    Cholecalciferol (VITAMIN D) 1000 UNITS capsule, Take 2,000 Units by mouth daily. , Disp: , Rfl:    diphenoxylate-atropine (LOMOTIL) 2.5-0.025 MG tablet, Take 1 tablet by mouth every 8 (eight) hours as needed for diarrhea or loose stools., Disp: 30 tablet, Rfl: 1   ELIQUIS 5 MG TABS tablet, TAKE  (1)  TABLET TWICE A DAY., Disp: 180 tablet, Rfl: 0   famotidine (PEPCID) 20 MG tablet, Take 1 tablet (20 mg total) by mouth at bedtime., Disp: 30 tablet, Rfl: 3   lisinopril (ZESTRIL) 20 MG tablet, Take 1 tablet (20 mg total) by mouth 2 (two) times daily., Disp: 60 tablet, Rfl: 0   metoprolol tartrate (LOPRESSOR) 50 MG tablet, TAKE  (1)   TABLET TWICE A DAY., Disp: 180 tablet, Rfl: 0   Multiple Vitamins-Minerals (CENTRUM SILVER ULTRA WOMENS PO), Take 1 tablet by mouth daily., Disp: , Rfl:    nitroGLYCERIN (NITROSTAT) 0.4 MG SL tablet, Place 1 tablet (0.4 mg total) under the tongue every 5 (five) minutes as needed for chest pain., Disp: 25 tablet, Rfl: 4   pantoprazole (PROTONIX) 40 MG tablet, Take 1 tablet (40 mg total) by mouth daily., Disp: 30 tablet, Rfl: 3   PARoxetine (PAXIL) 20 MG tablet, TAKE (1) TABLET DAILY IN THE MORNING., Disp: 90 tablet, Rfl: 0   rosuvastatin (CRESTOR) 40 MG tablet, Take 0.5 tablets (20 mg total) by mouth daily., Disp: 90 tablet, Rfl: 0   torsemide (DEMADEX) 20 MG tablet, Take 20 mg by mouth daily  as needed (edema)., Disp: , Rfl:  Social History   Socioeconomic History   Marital status: Widowed    Spouse name: Not on file   Number of children: Not on file   Years of education: Not on file   Highest education level: Not on file  Occupational History   Not on file  Social Needs   Financial resource strain: Not on file   Food insecurity    Worry: Not on file    Inability: Not on file   Transportation needs    Medical: Not on file    Non-medical: Not on file  Tobacco Use   Smoking status: Never Smoker   Smokeless tobacco: Never Used  Substance and Sexual Activity   Alcohol use: No   Drug use: No   Sexual activity: Never    Birth control/protection: Post-menopausal  Lifestyle   Physical activity    Days per week: Not on file    Minutes per session: Not on file   Stress: Not on file  Relationships   Social connections    Talks on phone: Not on file    Gets together: Not on file    Attends religious service: Not on file    Active member of club or organization: Not on file    Attends meetings of clubs or organizations: Not on file    Relationship status: Not on file   Intimate partner violence    Fear of current or ex partner: Not on file    Emotionally  abused: Not on file    Physically abused: Not on file    Forced sexual activity: Not on file  Other Topics Concern   Not on file  Social History Narrative   Not on file   Family History  Problem Relation Age of Onset   Stroke Mother    Other Mother        brain tumor   Other Father        MI   Heart attack Father    Stroke Sister    Macular degeneration Sister    Diabetes Daughter    Cancer Daughter        ovarian   Colon polyps Daughter    Atrial fibrillation Sister    Hyperlipidemia Sister    Osteoporosis Sister    Stroke Sister    Uterine cancer Sister    Heart attack Brother    Heart disease Brother    Asthma Brother    Breast cancer Neg Hx     Objective: Office vital signs reviewed. BP 129/74    Pulse (!) 50    Temp (!) 96.9 F (36.1 C)    Ht 5\' 3"  (1.6 m)    Wt 153 lb (69.4 kg)    BMI 27.10 kg/m   Physical Examination:  General: Awake, alert, well nourished, elderly female. No acute distress HEENT: Normal. Sclera white, MMM Cardio: bradycardic with seemingly regular rhythm. S1S2 heard, no murmurs appreciated Pulm: clear to auscultation bilaterally, no wheezes, rhonchi or rales; normal work of breathing on room air Extremities: warm, well perfused, No edema, cyanosis or clubbing; +2 pulses bilaterally MSK: antlagic gait and station; uses cane for ambulation  Right knee: Osteoarthritic changes noted.  She has mild swelling of the knee.  No palpable bony abnormalities.  No ligamentous laxity.   JOINT INJECTION:  Patient denies allergy to antiseptics (including iodine) and anesthetics  Patient has h/o conbtorlled diabetes. Denies frequent steroid use (gets shot q33m). She does  use of blood thinners.  Patient was given informed consent and a signed copy has been placed in the chart. Appropriate time out was taken. Area prepped and draped in usual sterile fashion. Anatomic landmarks were identified and injection site was marked.  Ethyl chloride  spray was used to numb the area and 1 cc of methylprednisolone 40 mg/ml plus  3 cc of 1% lidocaine without epinephrine was injected into the right knee using a(n) anteriolateral approach. The patient tolerated the procedure well and there were no immediate complications. Estimated blood loss is less than 1 cc.  Post procedure instructions were reviewed and handout outlining these instructions were provided to patient.   Assessment/ Plan: 85 y.o. female   1. Diabetes mellitus type 2, diet-controlled (HCC) Check A1c - Bayer DCA Hb A1c Waived  2. Hyperlipidemia associated with type 2 diabetes mellitus ([place]) Continue statin  3. Hypertension associated with diabetes ([place]) Continue triple therapy antihypertensives - Basic Metabolic Panel  4. CKD (chronic kidney disease) stage 3, GFR 30-59 ml/min (HCC) Continue ACE inhibitor.  Check BMP - Basic Metabolic Panel  5. Paroxysmal atrial fibrillation, chads2 Vasc2 score of 5, on eliquis Check CBC given use of chronic anticoagulation - CBC  6. ICD (implantable cardioverter-defibrillator) in place Has follow-up visit with cardiology soon for pacemaker check  7. Iron deficiency anemia, unspecified iron deficiency anemia type - CBC  8. Chronic pain of both knees Right knee was treated with corticosteroid injection today.  Patient tolerated procedure without difficulty.  Home care instructions reviewed and reasons for return discussed.  She will follow-up PRN.   Orders Placed This Encounter  Procedures   Basic Metabolic Panel   Bayer DCA Hb A1c Waived   CBC   No orders of the defined types were placed in this encounter.    [name], DO [place] [phone]

## 2018-07-12 NOTE — Patient Instructions (Signed)
Knee Injection A knee injection is a procedure to get medicine into your knee joint to relieve the pain, swelling, and stiffness of arthritis. Your health care provider uses a needle to inject medicine, which may also help to lubricate and cushion your knee joint. You may need more than one injection. Tell a health care provider about:  Any allergies you have.  All medicines you are taking, including vitamins, herbs, eye drops, creams, and over-the-counter medicines.  Any problems you or family members have had with anesthetic medicines.  Any blood disorders you have.  Any surgeries you have had.  Any medical conditions you have.  Whether you are pregnant or may be pregnant. What are the risks? Generally, this is a safe procedure. However, problems may occur, including:  Infection.  Bleeding.  Symptoms that get worse.  Damage to the area around your knee.  Allergic reaction to any of the medicines.  Skin reactions from repeated injections. What happens before the procedure?  Ask your health care provider about changing or stopping your regular medicines. This is especially important if you are taking diabetes medicines or blood thinners.  Plan to have someone take you home from the hospital or clinic. What happens during the procedure?   You will sit or lie down in a position for your knee to be treated.  The skin over your kneecap will be cleaned with a germ-killing soap.  You will be given a medicine that numbs the area (local anesthetic). You may feel some stinging.  The medicine will be injected into your knee. The needle is carefully placed between your kneecap and your knee. The medicine is injected into the joint space.  The needle will be removed at the end of the procedure.  A bandage (dressing) may be placed over the injection site. The procedure may vary among health care providers and hospitals. What can I expect after the procedure?  Your blood  pressure, heart rate, breathing rate, and blood oxygen level will be monitored until you leave the hospital or clinic.  You may have to move your knee through its full range of motion. This helps to get all the medicine into your joint space.  You will be watched to make sure that you do not have a reaction to the injected medicine.  You may feel more pain, swelling, and warmth than you did before the injection. This reaction may last about 1-2 days. Follow these instructions at home: Medicines  Take over-the-counter and prescription medicines only as told by your doctor.  Do not drive or use heavy machinery while taking prescription pain medicine.  Do not take medicines such as aspirin and ibuprofen unless your health care provider tells you to take them. Injection site care  Follow instructions from your health care provider about: ? How to take care of your puncture site. ? When and how you should change your dressing. ? When you should remove your dressing.  Check your injection area every day for signs of infection. Check for: ? More redness, swelling, or pain after 2 days. ? Fluid or blood. ? Pus or a bad smell. ? Warmth. Managing pain, stiffness, and swelling   If directed, put ice on the injection area: ? Put ice in a plastic bag. ? Place a towel between your skin and the bag. ? Leave the ice on for 20 minutes, 2-3 times per day.  Do not apply heat to your knee.  Raise (elevate) the injection area above the level   of your heart while you are sitting or lying down. General instructions  If you were given a dressing, keep it dry until your health care provider says it can be removed. Ask your health care provider when you can start showering or taking a bath.  Avoid strenuous activities for as long as directed by your health care provider. Ask your health care provider when you can return to your normal activities.  Keep all follow-up visits as told by your health  care provider. This is important. You may need more injections. Contact a health care provider if you have:  A fever.  Warmth in your injection area.  Fluid, blood, or pus coming from your injection site.  Symptoms at your injection site that last longer than 2 days after your procedure. Get help right away if:  Your knee: ? Turns very red. ? Becomes very swollen. ? Is in severe pain. Summary  A knee injection is a procedure to get medicine into your knee joint to relieve the pain, swelling, and stiffness of arthritis.  A needle is carefully placed between your kneecap and your knee to inject medicine into the joint space.  Before the procedure, ask your health care provider about changing or stopping your regular medicines, especially if you are taking diabetes medicines or blood thinners.  Contact your health care provider if you have any problems or questions after your procedure. This information is not intended to replace advice given to you by your health care provider. Make sure you discuss any questions you have with your health care provider. Document Released: 03/26/2006 Document Revised: 01/22/2017 Document Reviewed: 01/22/2017 Elsevier Patient Education  2020 Elsevier Inc.  

## 2018-07-13 LAB — BASIC METABOLIC PANEL
BUN/Creatinine Ratio: 19 (ref 12–28)
BUN: 37 mg/dL — ABNORMAL HIGH (ref 8–27)
CO2: 16 mmol/L — ABNORMAL LOW (ref 20–29)
Calcium: 9.3 mg/dL (ref 8.7–10.3)
Chloride: 106 mmol/L (ref 96–106)
Creatinine, Ser: 1.9 mg/dL — ABNORMAL HIGH (ref 0.57–1.00)
GFR calc Af Amer: 28 mL/min/{1.73_m2} — ABNORMAL LOW (ref >59)
GFR calc non Af Amer: 24 mL/min/{1.73_m2} — ABNORMAL LOW (ref >59)
Glucose: 153 mg/dL — ABNORMAL HIGH (ref 65–99)
Potassium: 4.9 mmol/L (ref 3.5–5.2)
Sodium: 135 mmol/L (ref 134–144)

## 2018-07-13 LAB — CBC
Hematocrit: 38.5 % (ref 34.0–46.6)
Hemoglobin: 12.8 g/dL (ref 11.1–15.9)
MCH: 30.5 pg (ref 26.6–33.0)
MCHC: 33.2 g/dL (ref 31.5–35.7)
MCV: 92 fL (ref 79–97)
NRBC: 1 % — ABNORMAL HIGH (ref 0–0)
Platelets: 284 10*3/uL (ref 150–450)
RBC: 4.19 x10E6/uL (ref 3.77–5.28)
RDW: 12.8 % (ref 11.7–15.4)
WBC: 6.1 10*3/uL (ref 3.4–10.8)

## 2018-07-15 NOTE — Progress Notes (Signed)
lmtcb 6/29 jr

## 2018-07-16 ENCOUNTER — Telehealth: Payer: Self-pay | Admitting: Family Medicine

## 2018-07-16 NOTE — Telephone Encounter (Signed)
Aware of lab results.  Patient sees Dr. Jeffie Pollock.  Results faxed

## 2018-07-17 ENCOUNTER — Telehealth: Payer: Self-pay | Admitting: *Deleted

## 2018-07-17 NOTE — Telephone Encounter (Signed)
    COVID-19 Pre-Screening Questions:  . In the past 7 to 10 days have you had a cough,  shortness of breath, headache, congestion, fever (100 or greater) body aches, chills, sore throat, or sudden loss of taste or sense of smell? no . Have you been around anyone with known Covid 19. no . Have you been around anyone who is awaiting Covid 19 test results in the past 7 to 10 days? no . Have you been around anyone who has been exposed to Covid 19, or has mentioned symptoms of Covid 19 within the past 7 to 10 days? no  If you have any concerns/questions about symptoms patients report during screening (either on the phone or at threshold). Contact the provider seeing the patient or DOD for further guidance.  If neither are available contact a member of the leadership team.  The patient has been advised to come alone to the appointment and to wear a mask. Appointment is 07/22/2018

## 2018-07-22 ENCOUNTER — Other Ambulatory Visit: Payer: Self-pay

## 2018-07-22 ENCOUNTER — Ambulatory Visit (INDEPENDENT_AMBULATORY_CARE_PROVIDER_SITE_OTHER): Payer: Medicare Other | Admitting: Cardiovascular Disease

## 2018-07-22 ENCOUNTER — Encounter: Payer: Self-pay | Admitting: Cardiovascular Disease

## 2018-07-22 ENCOUNTER — Telehealth: Payer: Self-pay | Admitting: *Deleted

## 2018-07-22 VITALS — BP 131/71 | HR 60 | Ht 63.0 in | Wt 155.0 lb

## 2018-07-22 DIAGNOSIS — E119 Type 2 diabetes mellitus without complications: Secondary | ICD-10-CM

## 2018-07-22 DIAGNOSIS — I2510 Atherosclerotic heart disease of native coronary artery without angina pectoris: Secondary | ICD-10-CM

## 2018-07-22 DIAGNOSIS — N183 Chronic kidney disease, stage 3 unspecified: Secondary | ICD-10-CM

## 2018-07-22 DIAGNOSIS — Z7901 Long term (current) use of anticoagulants: Secondary | ICD-10-CM

## 2018-07-22 DIAGNOSIS — I10 Essential (primary) hypertension: Secondary | ICD-10-CM

## 2018-07-22 DIAGNOSIS — R55 Syncope and collapse: Secondary | ICD-10-CM

## 2018-07-22 DIAGNOSIS — I4729 Other ventricular tachycardia: Secondary | ICD-10-CM

## 2018-07-22 DIAGNOSIS — E785 Hyperlipidemia, unspecified: Secondary | ICD-10-CM

## 2018-07-22 DIAGNOSIS — I472 Ventricular tachycardia: Secondary | ICD-10-CM

## 2018-07-22 DIAGNOSIS — E1159 Type 2 diabetes mellitus with other circulatory complications: Secondary | ICD-10-CM

## 2018-07-22 DIAGNOSIS — I480 Paroxysmal atrial fibrillation: Secondary | ICD-10-CM

## 2018-07-22 DIAGNOSIS — E1169 Type 2 diabetes mellitus with other specified complication: Secondary | ICD-10-CM

## 2018-07-22 DIAGNOSIS — I951 Orthostatic hypotension: Secondary | ICD-10-CM

## 2018-07-22 DIAGNOSIS — I152 Hypertension secondary to endocrine disorders: Secondary | ICD-10-CM

## 2018-07-22 DIAGNOSIS — Z95810 Presence of automatic (implantable) cardiac defibrillator: Secondary | ICD-10-CM | POA: Diagnosis not present

## 2018-07-22 MED ORDER — APIXABAN 2.5 MG PO TABS: 2.5000 mg | ORAL_TABLET | Freq: Two times a day (BID) | ORAL | 1 refills | Status: DC

## 2018-07-22 NOTE — Telephone Encounter (Signed)
[employer] to ship 14 day cardiac event monitor to her home.  Instructions included in kit.  Patient states, son, [name], will assist with monitor.  Parminder Trapani [phone].

## 2018-07-22 NOTE — Patient Instructions (Signed)
Medication Instructions:  DECREASE the Eliquis to 2.5 mg twice daily  If you need a refill on your cardiac medications before your next appointment, please call your pharmacy.   Lab work: None ordered  Tests:  Your physician has recommended that you wear a 14 day Zio monitor. This monitor is a medical device that records the heart's electrical activity. Doctors most often use these monitors to diagnose arrhythmias. Arrhythmias are problems with the speed or rhythm of the heartbeat. The monitor is a small device applied to your chest. You can wear one while you do your normal daily activities. While wearing this monitor if you have any symptoms to push the button and record what you felt. Once you have worn this monitor for the period of time provider prescribed (Usually 14 days), you will return the monitor device in the postage paid box. Once it is returned they will download the data collected and provide [place] with a report which the provider will then review and we will call you with those results. Important tips:  1. Avoid showering during the first 24 hours of wearing the monitor. 2. Avoid excessive sweating to help maximize wear time. 3. Do not submerge the device, no hot tubs, and no swimming pools. 4. Keep any lotions or oils away from the patch. 5. After 24 hours you may shower with the patch on. Take brief showers with your back facing the shower head.  6. Do not remove patch once it has been placed because that will interrupt data and decrease adhesive wear time. 7. Push the button when you have any symptoms and write down what you were feeling. 8. Once you have completed wearing your monitor, remove and place into box which has postage paid and place in your outgoing mailbox.  9. If for some reason you have misplaced your box then call our office and we can provide another box and/or mail it off for you.   Follow-Up: At [facility], you and your health needs are our priority.  As  part of our continuing mission to provide you with exceptional heart care, we have created designated Provider Care Teams.  These Care Teams include your primary Cardiologist (physician) and Advanced Practice Providers (APPs -  Physician Assistants and Nurse Practitioners) who all work together to provide you with the care you need, when you need it. You will need a follow up appointment in 6 months.  Please call our office 2 months in advance to schedule this appointment.  You may see Sanda Klein, MD or one of the following Advanced Practice Providers on your designated Care Team: [place], [place] . Fabian Sharp, PA-C .   Any Other Special Instructions Will Be Listed Below (If Applicable). A referral has been placed for [facility]

## 2018-07-22 NOTE — Progress Notes (Signed)
. Patient ID: [name], female   DOB: [birth year], 85 y.o.   MRN: [id number]    Cardiology Office Note    Date:  07/22/2018   ID:  [name], DOB 12-13-1931, MRN [id number]  PCP:  Renee Norlander, DO  Cardiologist:   Renee Klein, MD   No chief complaint on file.   History of Present Illness:  [name] is a 85 y.o. 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  She has had problems with intermittent weakness that occurs both when sitting and standing.  This is unpredictable but is happening with increasing frequency, not quite daily.  She feels a little better if she sits down.  The symptoms appear to have abrupt onset and abrupt termination and do not have a pattern that is always typical for orthostatic hypotension.  In the past we have shown at least occasional correlation of her complaints of weakness with episodes of atrial fibrillation.  Event monitoring last September showed episodes of lasted for 12-24 hours with good ventricular rate control.  No bradycardia was documented at the time, but she does have significant evidence of intraventricular conduction normalities on surface ECG.  She has not had significant problems with dyspnea or angina at rest or with activity and has not had leg edema, intermittent claudication or focal neurological events.  She has not had falls or overt bleeding.  She is on full dose Eliquis, but recent lab test showed that her creatinine is  up to 1.9.  This is without taking of any recent doses of torsemide.  Interrogation of her defibrillator shows normal findings.  Single-chamber defibrillator shows no episodes of high ventricular rates.  Estimated generator longevity is 12 years.  She has not had sustained or nonsustained ventricular arrhythmia.  She does not require ventricular pacing.   Her device is a Office manager device was implanted as a generator change in 2016.    She was on treatment with disopyramide for years for her history of catecholaminergic polymorphic VT, but this medication had to be gradually discontinued due to side effects, especially orthostatic hypotension and dry mouth.  In the past she developed torsades de pointes with sotalol and diarrhea with quinidine.  We have discussed the use of amiodarone to prevent atrial fibrillation episodes, but she did not want to take it because of the side effect profile.   Past Medical History:  Diagnosis Date   AICD (automatic cardioverter/defibrillator) present    Allergy    SESONAL   Anxiety    ARTHRITIS    Arthritis    Atrial fibrillation (HCC)    CAD (coronary artery disease)    a. history of cardiac arrest 1993;  b. s/p LAD/LCX stenting in 2003;  c. 07/2011 Cath: LM nl, LAD patent stent, D1 80ost, LCX patent stent, RCA min irregs;  d. 04/2012 MV: EF 66%, no ishcemia;  e. 12/2013 Echo: Ef 55%, no rwma, Gr 1 DD, triv AI/MR, mildly dil LA;  f. 12/2013 Lexi MV: intermediate risk - apical ischemia and inf/infsept fixed defect, ? artifactual.   CAD in native artery 12/31/2013  Previous stents to LAD and Ramus patent on cath today 12/31/13 also There is severe disease in the ostial first diagonal which is unchanged from most recent cardiac catheterization. The right coronary artery could not be engaged selectively but nonselective angiography showed no significant disease in the proximal and midsegment.      Cataract    DENIES   Chronic back pain     Chronic sinus bradycardia    CKD (chronic kidney disease), stage III (HCC)    Clotting disorder ([place])    DVT   COLITIS 12/02/2007   Qualifier: Diagnosis of  By: [name] CMA [name]), [name]     Diabetes mellitus without complication ([place])    DENIES   Diverticulosis of colon (without mention of hemorrhage) 2007   Colonoscopy    Esophageal stricture    a. 2012 s/p dil.   Esophagitis, unspecified    a. 2012 EGD   EXTERNAL HEMORRHOIDS    GERD (gastroesophageal reflux disease)    omeprazole   H/O hiatal hernia    Hiatal hernia    a. 2012 EGD.   History of DVT in the past, not on Coumadin now    left  leg   HYPERCHOLESTEROLEMIA    Hypertension    ICD (implantable cardiac defibrillator) in place    a. s/p initial ICD in 1993 in setting of cardiac arrest;  b. 01/2007 gen change: Guidant T135 Vitality DS VR single lead ICD.   Macular degeneration    gets injecton in eye every 5 weeks- last injection - 05/03/2013    Myocardial infarction [facility]) 1993   Near syncope 05/22/2015   Nephrolithiasis, just saw Dr [name]- "OK"    Orthostatic hypotension    Peripheral vascular disease ([place])    ???   RECTAL BLEEDING 12/03/2007   Qualifier: Diagnosis of  By: Renee Iles MD [name]    Unspecified gastritis and gastroduodenitis without mention of hemorrhage    a. 2003 EGD->not noted on 2012 EGD.   Unstable angina, neg MI, cath stable.maybe GI 12/30/2013    Past Surgical History:  Procedure Laterality Date   BREAST BIOPSY Left 11/2013   CARDIAC CATHETERIZATION     CARDIAC CATHETERIZATION  01/01/15   patent stents -there is severe disease in ostial 1st diag but without change.    CARDIAC DEFIBRILLATOR PLACEMENT  02/05/2007   Guidant   CARDIOVASCULAR STRESS TEST  12/30/2013   abnormal   COLONOSCOPY     coronary stents      CYSTOSCOPY WITH URETEROSCOPY Right 05/20/2013   Procedure: CYSTOSCOPY, RIGHT URETEROSCOPY STONE EXTRACTION, Insertion of right DOUBLE J STENT ;   Surgeon: Renee Seal, MD;  Location: [facility] ORS;  Service: Urology;  Laterality: Right;   CYSTOSCOPY WITH URETEROSCOPY AND STENT PLACEMENT N/A 06/03/2013   Procedure: SECOND LOOK CYSTOSCOPY WITH URETEROSCOPY  HOLMIUM LASER LITHO AND STONE EXTRACTION Sammie Bench ;  Surgeon: Malka So, MD;  Location: [facility];  Service: Urology;  Laterality: N/A;   GIVENS CAPSULE STUDY N/A 10/30/2016   Procedure: GIVENS CAPSULE STUDY;  Surgeon: Irene Shipper, MD;  Location: [facility] ENDOSCOPY;  Service: Endoscopy;  Laterality: N/A;  NEEDS TO BE ADMITTED FOR OBSERVATION PT. HAS DEFIB   HEMORROIDECTOMY  80's   HOLMIUM LASER APPLICATION Right 3/0/1601   Procedure: HOLMIUM LASER APPLICATION;  Surgeon: Renee Seal, MD;  Location: [facility];  Service: Urology;  Laterality: Right;   HYSTEROSCOPY W/D&C  09/02/2010   Procedure: DILATATION AND CURETTAGE (D&C) /HYSTEROSCOPY;  Surgeon: Margarette Asal;  Location:  [facility]S;  Service: Gynecology;  Laterality: N/A;  Dilation and Curettage with Hysteroscopy and Polypectomy   IMPLANTABLE CARDIOVERTER DEFIBRILLATOR (ICD) GENERATOR CHANGE N/A 02/10/2014   Procedure: ICD GENERATOR CHANGE;  Surgeon: Renee Klein, MD;  Location: [place] CATH LAB;  Service: Cardiovascular;  Laterality: N/A;   LEFT HEART CATHETERIZATION WITH CORONARY ANGIOGRAM N/A 07/28/2011   Procedure: LEFT HEART CATHETERIZATION WITH CORONARY ANGIOGRAM;  Surgeon: Lorretta Harp, MD;  Location: [facility] CATH LAB;  Service: Cardiovascular;  Laterality: N/A;   LEFT HEART CATHETERIZATION WITH CORONARY ANGIOGRAM N/A 12/31/2013   Procedure: LEFT HEART CATHETERIZATION WITH CORONARY ANGIOGRAM;  Surgeon: Wellington Hampshire, MD;  Location: [place] CATH LAB;  Service: Cardiovascular;  Laterality: N/A;    Current Medications: Outpatient Medications Prior to Visit  Medication Sig Dispense Refill   amLODipine (NORVASC) 2.5 MG tablet Take 1 tablet (2.5 mg total) by mouth daily. 90 tablet 3   Calcium Citrate-Vitamin D (CITRACAL + D PO) Take 1 tablet by mouth  daily.     Cholecalciferol (VITAMIN D) 1000 UNITS capsule Take 2,000 Units by mouth daily.      diphenoxylate-atropine (LOMOTIL) 2.5-0.025 MG tablet Take 1 tablet by mouth every 8 (eight) hours as needed for diarrhea or loose stools. 30 tablet 1   famotidine (PEPCID) 20 MG tablet Take 1 tablet (20 mg total) by mouth at bedtime. 30 tablet 3   lisinopril (ZESTRIL) 20 MG tablet Take 1 tablet (20 mg total) by mouth 2 (two) times daily. 60 tablet 0   metoprolol tartrate (LOPRESSOR) 50 MG tablet TAKE  (1)  TABLET TWICE A DAY. 180 tablet 0   Multiple Vitamins-Minerals (CENTRUM SILVER ULTRA WOMENS PO) Take 1 tablet by mouth daily.     nitroGLYCERIN (NITROSTAT) 0.4 MG SL tablet Place 1 tablet (0.4 mg total) under the tongue every 5 (five) minutes as needed for chest pain. 25 tablet 4   PARoxetine (PAXIL) 20 MG tablet TAKE (1) TABLET DAILY IN THE MORNING. 90 tablet 0   rosuvastatin (CRESTOR) 40 MG tablet Take 0.5 tablets (20 mg total) by mouth daily. 90 tablet 0   torsemide (DEMADEX) 20 MG tablet Take 20 mg by mouth daily as needed (edema).     ELIQUIS 5 MG TABS tablet TAKE  (1)  TABLET TWICE A DAY. 180 tablet 0   No facility-administered medications prior to visit.      Allergies:   Sotalol, Bactrim [sulfamethoxazole-trimethoprim], Ciprofloxacin, Actos [pioglitazone], Adhesive [tape], Contrast media [iodinated diagnostic agents], Latex, and Lipitor [atorvastatin]   Social History   Socioeconomic History   Marital status: Widowed    Spouse name: Not on file   Number of children: Not on file   Years of education: Not on file   Highest education level: Not on file  Occupational History   Not on file  Social Needs   Financial resource strain: Not on file   Food insecurity    Worry: Not on file    Inability: Not on file   Transportation needs    Medical: Not on file    Non-medical: Not on file  Tobacco Use   Smoking status: Never Smoker   Smokeless tobacco: Never Used    Substance and Sexual Activity   Alcohol use: No   Drug use: No   Sexual activity: Never    Birth control/protection: Post-menopausal  Lifestyle   Physical activity    Days per week: Not on file    Minutes per session: Not on file   Stress: Not on file  Relationships   Social Herbalist on phone: Not on file    Gets together: Not on file    Attends religious service: Not on file    Active member of club or organization: Not on file    Attends meetings of clubs or organizations: Not on file    Relationship status: Not on file  Other Topics Concern   Not on file  Social History Narrative   Not on file     Family History:  The patient's family history includes Asthma in her brother; Atrial fibrillation in her sister; Cancer in her daughter; Colon polyps in her daughter; Diabetes in her daughter; Heart attack in her brother and father; Heart disease in her brother; Hyperlipidemia in her sister; Macular degeneration in her sister; Osteoporosis in her sister; Other in her father and mother; Stroke in her mother, sister, and sister; Uterine cancer in her sister.   ROS:   Please see the history of present illness.    ROS all other systems are reviewed and are negative  PHYSICAL EXAM:   VS:  BP 131/71    Pulse 60    Ht 5\' 3"  (1.6 m)    Wt 155 lb (70.3 kg)    BMI 27.46 kg/m       General: Alert, oriented x3, no distress, healthy but protuberant left subclavian defibrillator site. Head: no evidence of trauma, PERRL, EOMI, no exophtalmos or lid lag, no myxedema, no xanthelasma; normal ears, nose and oropharynx Neck: normal jugular venous pulsations and no hepatojugular reflux; brisk carotid pulses without delay and no carotid bruits Chest: clear to auscultation, no signs of consolidation by percussion or palpation, normal fremitus, symmetrical and full respiratory excursions Cardiovascular: normal position and quality of the apical impulse, regular rhythm, normal first  and second heart sounds, no murmurs, rubs or gallops Abdomen: no tenderness or distention, no masses by palpation, no abnormal pulsatility or arterial bruits, normal bowel sounds, no hepatosplenomegaly Extremities: no clubbing, cyanosis or edema; 2+ radial, ulnar and brachial pulses bilaterally; 2+ right femoral, posterior tibial and dorsalis pedis pulses; 2+ left femoral, posterior tibial and dorsalis pedis pulses; no subclavian or femoral bruits Neurological: grossly nonfocal Psych: Normal mood and affect    Wt Readings from Last 3 Encounters:  07/22/18 155 lb (70.3 kg)  07/12/18 153 lb (69.4 kg)  02/12/18 159 lb (72.1 kg)    Studies/Labs Reviewed:   EKG:  EKG is ordered today.  It shows sinus bradycardia with first-degree AV block (PR 236 ms), incomplete left bundle branch block (QRS 114 ms), left axis deviation with nonspecific repolarization changes.  QTc normal 426 ms.     Recent Labs: 02/05/2018: TSH 3.439 02/06/2018: ALT 16 02/12/2018: Magnesium 1.9 07/12/2018: BUN 37; Creatinine, Ser 1.90; Hemoglobin 12.8; Platelets 284; Potassium 4.9; Sodium 135   Lipid Panel    Component Value Date/Time   CHOL 141 12/31/2017 1458   CHOL 140 07/31/2012 1019   TRIG 170 (H) 12/31/2017 1458   TRIG 115 06/07/2015 1111   TRIG 161 (H) 07/31/2012 1019   HDL 52 12/31/2017 1458   HDL 68 06/07/2015 1111   HDL 43 07/31/2012 1019   CHOLHDL 2.7 12/31/2017 1458   CHOLHDL 2.8 09/02/2010 0629   VLDL 34 09/02/2010 0629   LDLCALC 55 12/31/2017 1458   LDLCALC 65 09/09/2013 0841   LDLCALC 65 07/31/2012 1019     ASSESSMENT:    1. Near syncope   2. Paroxysmal atrial fibrillation (HCC)   3.  CKD (chronic kidney disease) stage 3, GFR 30-59 ml/min (HCC)   4. Long term current use of anticoagulant   5. Catecholaminergic polymorphic ventricular tachycardia ([place])   6. Coronary artery disease involving native coronary artery of native heart without angina pectoris   7. Hypertension associated with  diabetes ([place])   8. Orthostatic hypotension   9. Diabetes mellitus type 2, diet-controlled ([place])   10. Hyperlipidemia associated with type 2 diabetes mellitus ([place])   11. ICD (implantable cardioverter-defibrillator) in place      PLAN:  In order of problems listed above:  1. AFib: We did prove correlation between this arrhythmia and her "weak spells".    She is fully anticoagulated. CHADSVasc 6 (age 51, gender, DM, CAD, hypertension).  There is a very short list of appropriate antiarrhythmics for her, due to her other cardiac problems.  I would states limited to amiodarone and Multaq and she has declined to take either. 2. Eliquis: No history of TIA or stroke or other systemic embolism.  Worsening renal function.  Decrease Eliquis to 2.5 mg twice daily (age greater than 81, creatinine greater than 1.5).Hemoglobin is normal at 12.8 with normocytic indices. 3. VT: No events documented in years.. she has not had any defibrillator therapy or any recurrence of VT since her defibrillator implantation in 1993.  She has been off disopyramide for several years now. 4. Near syncope: It is also possible that her symptoms are related to bradycardia as she has developed higher degree AV block, since she has incomplete left bundle branch block and long PR interval.  We will check another event monitor.  She might need an upgrade to a dual-chamber system. 5. CAD: Denies angina pectoris.  She has normal left ventricular systolic function.  She does not have angina pectoris and has not required revascularization since 2003.  Her last assessment was a cardiac catheterization 2015 that did not show any significant coronary stenoses. 6. HTN/Orthostatic hypotension: Blood pressure is in acceptable range. 7. DM: Diet controlled, A1c borderline at 7.1%. 8. CKD: Creatinine is substantially worse at 1.9 (GFR around 25.).  Not sure if this is reversible.  Refer to [facility].  Reduce the dose of Eliquis.  Avoid NSAIDs.   May need to discontinue her ACE inhibitor. 9. ICD: Normal single-chamber device function.  Remote download in 3 months, follow-up visit in 6 months. 10. HLP: Taking statin and very close to target LDL of 70 or less.  Medication Adjustments/Labs and Tests Ordered: Current medicines are reviewed at length with the patient today.  Concerns regarding medicines are outlined above.  Medication changes, Labs and Tests ordered today are listed in the Patient Instructions below. Patient Instructions  Medication Instructions:  DECREASE the Eliquis to 2.5 mg twice daily  If you need a refill on your cardiac medications before your next appointment, please call your pharmacy.   Lab work: None ordered  Tests:  Your physician has recommended that you wear a 14 day Zio monitor. This monitor is a medical device that records the hearts electrical activity. Doctors most often use these monitors to diagnose arrhythmias. Arrhythmias are problems with the speed or rhythm of the heartbeat. The monitor is a small device applied to your chest. You can wear one while you do your normal daily activities. While wearing this monitor if you have any symptoms to push the button and record what you felt. Once you have worn this monitor for the period of time provider prescribed (Usually 14 days), you will return  the monitor device in the postage paid box. Once it is returned they will download the data collected and provide [place] with a report which the provider will then review and we will call you with those results. Important tips:  1. Avoid showering during the first 24 hours of wearing the monitor. 2. Avoid excessive sweating to help maximize wear time. 3. Do not submerge the device, no hot tubs, and no swimming pools. 4. Keep any lotions or oils away from the patch. 5. After 24 hours you may shower with the patch on. Take brief showers with your back facing the shower head.  6. Do not remove patch once it has been  placed because that will interrupt data and decrease adhesive wear time. 7. Push the button when you have any symptoms and write down what you were feeling. 8. Once you have completed wearing your monitor, remove and place into box which has postage paid and place in your outgoing mailbox.  9. If for some reason you have misplaced your box then call our office and we can provide another box and/or mail it off for you.   Follow-Up: At [facility], you and your health needs are our priority.  As part of our continuing mission to provide you with exceptional heart care, we have created designated Provider Care Teams.  These Care Teams include your primary Cardiologist (physician) and Advanced Practice Providers (APPs -  Physician Assistants and Nurse Practitioners) who all work together to provide you with the care you need, when you need it. You will need a follow up appointment in 6 months.  Please call our office 2 months in advance to schedule this appointment.  You may see Renee Klein, MD or one of the following Advanced Practice Providers on your designated Care Team: Almyra Deforest, [place]  Fabian Sharp, [place]    Any Other Special Instructions Will Be Listed Below (If Applicable). A referral has been placed for [place], Renee Klein, MD  07/22/2018 10:26 AM    [address], [place], [place]  [address] Phone: [phone]; Fax: [phone]

## 2018-07-23 NOTE — Addendum Note (Signed)
Addended by: Ricci Barker on: 07/23/2018 02:03 PM   Modules accepted: Orders

## 2018-07-24 ENCOUNTER — Ambulatory Visit (INDEPENDENT_AMBULATORY_CARE_PROVIDER_SITE_OTHER): Payer: Medicare Other | Admitting: *Deleted

## 2018-07-24 DIAGNOSIS — I429 Cardiomyopathy, unspecified: Secondary | ICD-10-CM

## 2018-07-24 LAB — CUP PACEART REMOTE DEVICE CHECK
Battery Remaining Longevity: 138 mo
Battery Remaining Percentage: 100 %
Brady Statistic RV Percent Paced: 1 %
Date Time Interrogation Session: 20200708081900
HighPow Impedance: 45 Ohm
Implantable Lead Implant Date: 20090120
Implantable Lead Location: 753860
Implantable Lead Model: 157
Implantable Lead Serial Number: 138237
Implantable Pulse Generator Implant Date: 20160126
Lead Channel Impedance Value: 646 Ohm
Lead Channel Pacing Threshold Amplitude: 0.8 V
Lead Channel Pacing Threshold Pulse Width: 0.5 ms
Lead Channel Setting Pacing Amplitude: 2.2 V
Lead Channel Setting Pacing Pulse Width: 0.5 ms
Lead Channel Setting Sensing Sensitivity: 0.6 mV
Pulse Gen Serial Number: 191956

## 2018-07-26 ENCOUNTER — Ambulatory Visit (INDEPENDENT_AMBULATORY_CARE_PROVIDER_SITE_OTHER): Payer: Medicare Other

## 2018-07-26 DIAGNOSIS — R55 Syncope and collapse: Secondary | ICD-10-CM

## 2018-07-26 DIAGNOSIS — I480 Paroxysmal atrial fibrillation: Secondary | ICD-10-CM | POA: Diagnosis not present

## 2018-08-02 ENCOUNTER — Telehealth: Payer: Self-pay | Admitting: *Deleted

## 2018-08-02 NOTE — Telephone Encounter (Signed)
Spoke with patient regarding appointment with [facility] scheduled for 08/21/18 at 11:00 am---arrive time 10:30 am with Dr. Johnney Ou.  Patient voiced her understanding

## 2018-08-05 ENCOUNTER — Encounter: Payer: Self-pay | Admitting: Cardiology

## 2018-08-05 NOTE — Progress Notes (Signed)
Remote ICD transmission.   

## 2018-08-12 ENCOUNTER — Other Ambulatory Visit: Payer: Self-pay | Admitting: Cardiovascular Disease

## 2018-08-12 NOTE — Telephone Encounter (Signed)
Refill Request.  

## 2018-08-14 ENCOUNTER — Telehealth: Payer: Self-pay | Admitting: *Deleted

## 2018-08-14 MED ORDER — METOPROLOL TARTRATE 25 MG PO TABS: 25.0000 mg | ORAL_TABLET | Freq: Two times a day (BID) | ORAL | 1 refills | Status: DC

## 2018-08-14 NOTE — Telephone Encounter (Signed)
-----   Message from Sanda Klein, MD sent at 08/13/2018  8:02 PM EDT ----- No serious rhythm problems, but heart rate is slow all the time. Please cut back the beta blocker in half (25 mg twice daily).

## 2018-08-14 NOTE — Telephone Encounter (Signed)
Patient made aware of results and verbalized understanding.  Metoprolol Tartrate 25 mg bid has been sent to the pharmacy for her

## 2018-08-15 ENCOUNTER — Other Ambulatory Visit: Payer: Self-pay | Admitting: *Deleted

## 2018-08-15 MED ORDER — PAROXETINE HCL 20 MG PO TABS: ORAL_TABLET | ORAL | 0 refills | Status: DC

## 2018-08-21 DIAGNOSIS — N390 Urinary tract infection, site not specified: Secondary | ICD-10-CM | POA: Diagnosis not present

## 2018-08-21 DIAGNOSIS — N184 Chronic kidney disease, stage 4 (severe): Secondary | ICD-10-CM | POA: Diagnosis not present

## 2018-08-21 DIAGNOSIS — I2510 Atherosclerotic heart disease of native coronary artery without angina pectoris: Secondary | ICD-10-CM | POA: Diagnosis not present

## 2018-08-21 DIAGNOSIS — E872 Acidosis: Secondary | ICD-10-CM | POA: Diagnosis not present

## 2018-08-21 DIAGNOSIS — D631 Anemia in chronic kidney disease: Secondary | ICD-10-CM | POA: Diagnosis not present

## 2018-08-21 DIAGNOSIS — I480 Paroxysmal atrial fibrillation: Secondary | ICD-10-CM | POA: Diagnosis not present

## 2018-08-21 DIAGNOSIS — I129 Hypertensive chronic kidney disease with stage 1 through stage 4 chronic kidney disease, or unspecified chronic kidney disease: Secondary | ICD-10-CM | POA: Diagnosis not present

## 2018-08-23 ENCOUNTER — Telehealth: Payer: Self-pay | Admitting: Cardiovascular Disease

## 2018-08-23 NOTE — Telephone Encounter (Signed)
Returned the call to the patient. She stated that she saw a Nephrologist at [facility] and they have recommended that she stop the Lisinopril. She is concerned that her blood pressure will go too high without it. She stated that they also want her to come back next Wednesday for lab work.   She has been advised that we do not have access to their chart system but it could be that they are concerned about her kidney function. She has been informed that we will call their office on Monday to get an update and then call her back. She has verbalized her understanding.

## 2018-08-23 NOTE — Telephone Encounter (Signed)
Patient is calling about her medication lisinopril (ZESTRIL) 20 MG tablet. She wants to know if she needs to cut that in 1/2.

## 2018-08-23 NOTE — Telephone Encounter (Signed)
Called patient back- she is confused on which medications she is suppose to be taking half of. She knows it was Eliquis, and Metoprolol, but now was told to cut out the Lisinopril all together due to lab work- I looked in chart and did not see anything for that, but she states it was because of her potassium level.   I advised patient I would route to Primary nurse, as patient states she still is not feeling right since decreasing these medications and is concerned and confused.

## 2018-08-26 NOTE — Telephone Encounter (Signed)
I agree with holding the lisinopril due to the high potassium and rechecking.

## 2018-08-26 NOTE — Telephone Encounter (Signed)
Records have been received from [facility]. According to her lab work completed there her Potassium was 5/9 and Creatinine was 1.75. They have advised her to stop the Lisinopril, monitor her blood pressure and come back this week for a repeat BMP.  The patient verbalized her understanding.

## 2018-08-28 ENCOUNTER — Emergency Department (HOSPITAL_COMMUNITY): Payer: Medicare Other

## 2018-08-28 ENCOUNTER — Encounter (HOSPITAL_COMMUNITY): Payer: Self-pay | Admitting: Emergency Medicine

## 2018-08-28 ENCOUNTER — Emergency Department (HOSPITAL_COMMUNITY)
Admission: EM | Admit: 2018-08-28 | Discharge: 2018-08-28 | Disposition: A | Discharge status: Home / Self Care | Payer: Medicare Other | Attending: Emergency Medicine | Admitting: Emergency Medicine

## 2018-08-28 DIAGNOSIS — I2510 Atherosclerotic heart disease of native coronary artery without angina pectoris: Secondary | ICD-10-CM | POA: Diagnosis not present

## 2018-08-28 DIAGNOSIS — E1122 Type 2 diabetes mellitus with diabetic chronic kidney disease: Secondary | ICD-10-CM | POA: Insufficient documentation

## 2018-08-28 DIAGNOSIS — I951 Orthostatic hypotension: Secondary | ICD-10-CM | POA: Diagnosis not present

## 2018-08-28 DIAGNOSIS — I252 Old myocardial infarction: Secondary | ICD-10-CM | POA: Diagnosis not present

## 2018-08-28 DIAGNOSIS — R0789 Other chest pain: Secondary | ICD-10-CM | POA: Diagnosis not present

## 2018-08-28 DIAGNOSIS — I129 Hypertensive chronic kidney disease with stage 1 through stage 4 chronic kidney disease, or unspecified chronic kidney disease: Secondary | ICD-10-CM | POA: Insufficient documentation

## 2018-08-28 DIAGNOSIS — N184 Chronic kidney disease, stage 4 (severe): Secondary | ICD-10-CM | POA: Diagnosis not present

## 2018-08-28 DIAGNOSIS — N183 Chronic kidney disease, stage 3 (moderate): Secondary | ICD-10-CM | POA: Diagnosis not present

## 2018-08-28 DIAGNOSIS — Z91040 Latex allergy status: Secondary | ICD-10-CM | POA: Diagnosis not present

## 2018-08-28 DIAGNOSIS — I10 Essential (primary) hypertension: Secondary | ICD-10-CM | POA: Diagnosis not present

## 2018-08-28 DIAGNOSIS — R42 Dizziness and giddiness: Secondary | ICD-10-CM | POA: Diagnosis not present

## 2018-08-28 DIAGNOSIS — Z7901 Long term (current) use of anticoagulants: Secondary | ICD-10-CM | POA: Diagnosis not present

## 2018-08-28 DIAGNOSIS — Z79899 Other long term (current) drug therapy: Secondary | ICD-10-CM | POA: Insufficient documentation

## 2018-08-28 LAB — BASIC METABOLIC PANEL
Anion gap: 9 (ref 5–15)
BUN: 34 mg/dL — ABNORMAL HIGH (ref 8–23)
CO2: 18 mmol/L — ABNORMAL LOW (ref 22–32)
Calcium: 9.2 mg/dL (ref 8.9–10.3)
Chloride: 107 mmol/L (ref 98–111)
Creatinine, Ser: 1.94 mg/dL — ABNORMAL HIGH (ref 0.44–1.00)
GFR calc Af Amer: 27 mL/min — ABNORMAL LOW (ref >60)
GFR calc non Af Amer: 23 mL/min — ABNORMAL LOW (ref >60)
Glucose, Bld: 166 mg/dL — ABNORMAL HIGH (ref 70–99)
Potassium: 5.2 mmol/L — ABNORMAL HIGH (ref 3.5–5.1)
Sodium: 134 mmol/L — ABNORMAL LOW (ref 135–145)

## 2018-08-28 LAB — TROPONIN I (HIGH SENSITIVITY)
Troponin I (High Sensitivity): 8 ng/L (ref <18)
Troponin I (High Sensitivity): 6 ng/L (ref <18)

## 2018-08-28 LAB — CBC
HCT: 36.6 % (ref 36.0–46.0)
Hemoglobin: 11.8 g/dL — ABNORMAL LOW (ref 12.0–15.0)
MCH: 31.1 pg (ref 26.0–34.0)
MCHC: 32.2 g/dL (ref 30.0–36.0)
MCV: 96.3 fL (ref 80.0–100.0)
Platelets: 236 10*3/uL (ref 150–400)
RBC: 3.8 MIL/uL — ABNORMAL LOW (ref 3.87–5.11)
RDW: 13.3 % (ref 11.5–15.5)
WBC: 6.8 10*3/uL (ref 4.0–10.5)
nRBC: 0.3 % — ABNORMAL HIGH (ref 0.0–0.2)

## 2018-08-28 LAB — POC OCCULT BLOOD, ED: Fecal Occult Bld: NEGATIVE

## 2018-08-28 MED ORDER — SODIUM CHLORIDE 0.9% FLUSH: 3.0000 mL | Freq: Once | INTRAVENOUS | Status: DC

## 2018-08-28 MED ORDER — SODIUM CHLORIDE 0.9 % IV BOLUS
250.0000 mL | Freq: Once | INTRAVENOUS | Status: AC
  Administered 2018-08-28: 250 mL via INTRAVENOUS

## 2018-08-28 NOTE — ED Triage Notes (Signed)
Pt reports for the last week she has been having frequent episodes where she feels faint and "dizzy headed". Pt was on the way to her nephrologist appt when she had a similar episode of feeling dizzy and faint did not lose consciousness but alos had some pressure in her chest.

## 2018-08-28 NOTE — ED Notes (Signed)
Pt to ct 

## 2018-08-28 NOTE — ED Notes (Signed)
Pt returned from CT °

## 2018-08-28 NOTE — ED Notes (Signed)
Family here with pt  now

## 2018-08-28 NOTE — ED Notes (Signed)
Pt ambulated with minimal assistance. Pt uses cane @ home to get around. Pt was steady on her feet.

## 2018-08-28 NOTE — ED Provider Notes (Signed)
[facility] EMERGENCY DEPARTMENT Provider Note   CSN: [id number] Arrival date & time: 08/28/18  1129    History   Chief Complaint Chief Complaint  Patient presents with  . Near Syncope    HPI [name] is a 85 y.o. female.     The history is provided by the patient and medical records. No language interpreter was used.  Near Syncope   [name] is a 85 y.o. female who presents to the Emergency Department complaining of dizziness. She presents to the ED complaining of one month of progressive dizziness.  Has a hx/o afib, CKD,  DVT, on Eliquis. Symptoms are waxing and waning in nature and worse with standing and activity. Episodes last for a few seconds at a time. She feels dizzy as if things are spinning as well as lightheaded when she attempts activity. Today she had a more severe episode and presents to the ED for evaluation.   Recently her metoprolol was decreased due to relative hypotension and lisinopril was stopped.  Eliquis dose also decreased.  She missed her nephrology follow up appointment today.  She complains of associated generalized weakness, fatigue and malaise. Has diarrhea.  No hematochezia, melena. She had some transient chest pressure earlier today, now resolved. Denies fever, shortness of breath, cough, nausea, vomiting, Donald pain, dysuria. Past Medical History:  Diagnosis Date  . AICD (automatic cardioverter/defibrillator) present   . Allergy    SESONAL  . Anxiety   . ARTHRITIS   . Arthritis   . Atrial fibrillation ([place])   . CAD (coronary artery disease)    a. history of cardiac arrest 1993;  b. s/p LAD/LCX stenting in 2003;  c. 07/2011 Cath: LM nl, LAD patent stent, D1 80ost, LCX patent stent, RCA min irregs;  d. 04/2012 MV: EF 66%, no ishcemia;  e. 12/2013 Echo: Ef 55%, no rwma, Gr 1 DD, triv AI/MR, mildly dil LA;  f. 12/2013 Lexi MV: intermediate risk - apical ischemia and inf/infsept fixed defect, ? artifactual.  . CAD in  native artery 12/31/2013   Previous stents to LAD and Ramus patent on cath today 12/31/13 also There is severe disease in the ostial first diagonal which is unchanged from most recent cardiac catheterization. The right coronary artery could not be engaged selectively but nonselective angiography showed no significant disease in the proximal and midsegment.     . Cataract    DENIES  . Chronic back pain   . Chronic sinus bradycardia   . CKD (chronic kidney disease), stage III ([place])   . Clotting disorder (HCC)    DVT  . COLITIS 12/02/2007   Qualifier: Diagnosis of  By: Nils Pyle CMA ([place]), [name]    . Diabetes mellitus without complication ([employer])    DENIES  . Diverticulosis of colon (without mention of hemorrhage) 2007   Colonoscopy   . Esophageal stricture    a. 2012 s/p dil.  . Esophagitis, unspecified    a. 2012 EGD  . EXTERNAL HEMORRHOIDS   . GERD (gastroesophageal reflux disease)    omeprazole  . H/O hiatal hernia   . Hiatal hernia    a. 2012 EGD.  Marland Kitchen History of DVT in the past, not on Coumadin now    left  leg  . HYPERCHOLESTEROLEMIA   . Hypertension   . ICD (implantable cardiac defibrillator) in place    a. s/p initial ICD in 1993 in setting of cardiac arrest;  b. 01/2007 gen change: Guidant T135 Vitality DS VR  single lead ICD.  . Macular degeneration    gets injecton in eye every 5 weeks- last injection - 05/03/2013   . Myocardial infarction ([place]) 1993  . Near syncope 05/22/2015  . Nephrolithiasis, just saw Dr [name]   . Orthostatic hypotension   . Peripheral vascular disease ([place])    ???  . RECTAL BLEEDING 12/03/2007   Qualifier: Diagnosis of  By: Sharlett Iles MD Byrd Hesselbach   . Unspecified gastritis and gastroduodenitis without mention of hemorrhage    a. 2003 EGD->not noted on 2012 EGD.  Marland Kitchen Unstable angina, neg MI, cath stable.maybe GI 12/30/2013    Patient Active Problem List   Diagnosis Date Noted  . Gastroesophageal reflux disease   . Hypertension associated  with diabetes ([place]) 12/21/2017  . History of drug-induced prolonged QT interval with torsade de pointes 11/01/2016  . Iron deficiency anemia 10/30/2016  . Long term current use of anticoagulant 02/17/2016  . Diabetes mellitus type 2, diet-controlled ([place]) 09/14/2014  . ICD (implantable cardioverter-defibrillator) battery depletion 01/20/2014  . Abnormal nuclear stress test, 12/30/13 12/31/2013  . Coronary artery disease involving native coronary artery of native heart without angina pectoris 12/31/2013  . Paroxysmal atrial fibrillation, chads2 Vasc2 score of 5, on eliquis 10/16/2013  . Catecholaminergic polymorphic ventricular tachycardia ([place]) 10/16/2013  . Ureteral stone with hydronephrosis 05/20/2013  . Metabolic syndrome [id number]  . Orthostatic hypotension 07/27/2011  . Chest pain, r/o cardiac, negative MI 07/25/2011  . Weakness 07/25/2011    Class: Acute  . Chronic sinus bradycardia 07/25/2011  . ICD (implantable cardioverter-defibrillator) in place 07/25/2011  . CKD (chronic kidney disease) stage 3, GFR 30-59 ml/min (HCC) 07/25/2011    Class: Chronic  . History of DVT in the past, on eliquis now 07/25/2011    Class: History of  . Nephrolithiasis, just saw Dr [name] 07/25/2011    Class: History of  . DYSPHAGIA 03/24/2010  . Chronic ischemic heart disease 12/03/2007  . Irritable bowel syndrome with diarrhea 12/03/2007  . EPIGASTRIC PAIN 12/03/2007  . Hyperlipidemia associated with type 2 diabetes mellitus ([place]) 12/02/2007    Class: History of  . EXTERNAL HEMORRHOIDS 12/02/2007    Class: History of  . ESOPHAGITIS 12/02/2007  . GASTRITIS 12/02/2007  . DIVERTICULOSIS, COLON 12/02/2007  . ARTHRITIS 12/02/2007    Class: Chronic    Past Surgical History:  Procedure Laterality Date  . BREAST BIOPSY Left 11/2013  . CARDIAC CATHETERIZATION    . CARDIAC CATHETERIZATION  01/01/15   patent stents -there is severe disease in ostial 1st diag but without change.   Marland Kitchen CARDIAC  DEFIBRILLATOR PLACEMENT  02/05/2007   Guidant  . CARDIOVASCULAR STRESS TEST  12/30/2013   abnormal  . COLONOSCOPY    . coronary stents     . CYSTOSCOPY WITH URETEROSCOPY Right 05/20/2013   Procedure: CYSTOSCOPY, RIGHT URETEROSCOPY STONE EXTRACTION, Insertion of right DOUBLE J STENT ;  Surgeon: Irine Seal, MD;  Location: [facility];  Service: Urology;  Laterality: Right;  . CYSTOSCOPY WITH URETEROSCOPY AND STENT PLACEMENT N/A 06/03/2013   Procedure: SECOND LOOK CYSTOSCOPY WITH URETEROSCOPY  HOLMIUM LASER LITHO AND STONE EXTRACTION Sammie Bench ;  Surgeon: Malka So, MD;  Location: [facility];  Service: Urology;  Laterality: N/A;  . GIVENS CAPSULE STUDY N/A 10/30/2016   Procedure: GIVENS CAPSULE STUDY;  Surgeon: Irene Shipper, MD;  Location: [facility];  Service: Endoscopy;  Laterality: N/A;  NEEDS TO BE ADMITTED FOR OBSERVATION PT. HAS DEFIB  . HEMORROIDECTOMY  80's  .  HOLMIUM LASER APPLICATION Right [id number]   Procedure: HOLMIUM LASER APPLICATION;  Surgeon: Irine Seal, MD;  Location: [facility];  Service: Urology;  Laterality: Right;  . HYSTEROSCOPY W/D&C  09/02/2010   Procedure: DILATATION AND CURETTAGE (D&C) /HYSTEROSCOPY;  Surgeon: Margarette Asal;  Location: [facility];  Service: Gynecology;  Laterality: N/A;  Dilation and Curettage with Hysteroscopy and Polypectomy  . IMPLANTABLE CARDIOVERTER DEFIBRILLATOR (ICD) GENERATOR CHANGE N/A 02/10/2014   Procedure: ICD GENERATOR CHANGE;  Surgeon: Sanda Klein, MD;  Location: [facility] C[facility] LAB;  Service: Cardiovascular;  Laterality: N/A;  . LEFT HEART CATHETERIZATION WITH CORONARY ANGIOGRAM N/A 07/28/2011   Procedure: LEFT HEART CATHETERIZATION WITH CORONARY ANGIOGRAM;  Surgeon: Lorretta Harp, MD;  Location: [facility];  Service: Cardiovascular;  Laterality: N/A;  . LEFT HEART CATHETERIZATION WITH CORONARY ANGIOGRAM N/A 12/31/2013   Procedure: LEFT HEART CATHETERIZATION WITH CORONARY ANGIOGRAM;  Surgeon: Wellington Hampshire, MD;  Location: [facility] CATH LAB;  Service:  Cardiovascular;  Laterality: N/A;     OB History   No obstetric history on file.      Home Medications    Prior to Admission medications   Medication Sig Start Date End Date Taking? Authorizing Provider  apixaban (ELIQUIS) 2.5 MG TABS tablet Take 1 tablet (2.5 mg total) by mouth 2 (two) times daily. 07/22/18   Croitoru, Mihai, MD  Calcium Citrate-Vitamin D (CITRACAL + D PO) Take 1 tablet by mouth daily.    [provider]  Cholecalciferol (VITAMIN D) 1000 UNITS capsule Take 2,000 Units by mouth daily.     [provider]  diphenoxylate-atropine (LOMOTIL) 2.5-0.025 MG tablet Take 1 tablet by mouth every 8 (eight) hours as needed for diarrhea or loose stools. 05/16/18 05/16/19  Chipper Herb, MD  famotidine (PEPCID) 20 MG tablet Take 1 tablet (20 mg total) by mouth at bedtime. 02/07/18   Barton Dubois, MD  metoprolol tartrate (LOPRESSOR) 25 MG tablet Take 1 tablet (25 mg total) by mouth 2 (two) times daily. 08/14/18   Croitoru, Mihai, MD  Multiple Vitamins-Minerals (CENTRUM SILVER ULTRA WOMENS PO) Take 1 tablet by mouth daily.    [provider]  nitroGLYCERIN (NITROSTAT) 0.4 MG SL tablet Place 1 tablet (0.4 mg total) under the tongue every 5 (five) minutes as needed for chest pain. 12/31/13   Isaiah Serge, NP  PARoxetine (PAXIL) 20 MG tablet TAKE (1) TABLET DAILY IN THE MORNING. 08/15/18   Ronnie Doss M, DO  rosuvastatin (CRESTOR) 40 MG tablet Take 0.5 tablets (20 mg total) by mouth daily. 01/18/18   Chipper Herb, MD  torsemide (DEMADEX) 20 MG tablet Take 20 mg by mouth daily as needed (edema).    [provider]  amLODipine (NORVASC) 2.5 MG tablet Take 1 tablet (2.5 mg total) by mouth daily. 03/04/18 08/28/18  Croitoru, Dani Gobble, MD    Family History Family History  Problem Relation Age of Onset  . Stroke Mother   . Other Mother        brain tumor  . Other Father        MI  . Heart attack Father   . Stroke Sister   . Macular degeneration  Sister   . Diabetes Daughter   . Cancer Daughter        ovarian  . Colon polyps Daughter   . Atrial fibrillation Sister   . Hyperlipidemia Sister   . Osteoporosis Sister   . Stroke Sister   . Uterine cancer Sister   . Heart attack Brother   .  Heart disease Brother   . Asthma Brother   . Breast cancer Neg Hx     Social History Social History   Tobacco Use  . Smoking status: Never Smoker  . Smokeless tobacco: Never Used  Substance Use Topics  . Alcohol use: No  . Drug use: No     Allergies   Sotalol, Bactrim [sulfamethoxazole-trimethoprim], Ciprofloxacin, Actos [pioglitazone], Adhesive [tape], Contrast media [iodinated diagnostic agents], Latex, and Lipitor [atorvastatin]   Review of Systems Review of Systems  Cardiovascular: Positive for near-syncope.  All other systems reviewed and are negative.    Physical Exam Updated Vital Signs BP 133/67 (BP Location: Left Arm)   Pulse (!) 56   Temp 98.2 F (36.8 C) (Oral)   Resp 15   SpO2 100%   Physical Exam Vitals signs and nursing note reviewed.  Constitutional:      Appearance: She is well-developed.  HENT:     Head: Normocephalic and atraumatic.  Cardiovascular:     Rate and Rhythm: Regular rhythm. Bradycardia present.     Heart sounds: No murmur.  Pulmonary:     Effort: Pulmonary effort is normal. No respiratory distress.     Breath sounds: Normal breath sounds.  Abdominal:     Palpations: Abdomen is soft.     Tenderness: There is no abdominal tenderness. There is no guarding or rebound.  Musculoskeletal:        General: No swelling or tenderness.  Skin:    General: Skin is warm and dry.  Neurological:     Mental Status: She is alert and oriented to person, place, and time.     Comments: 5/5 strength in all four extremities with sensation to light touch intact in all four extremities.  No facial asymmetry.  No pronator drift.  Visual fields grossly intact.   Psychiatric:        Mood and Affect: Mood  normal.        Behavior: Behavior normal.      ED Treatments / Results  Labs (all labs ordered are listed, but only abnormal results are displayed) Labs Reviewed  BASIC METABOLIC PANEL - Abnormal; Notable for the following components:      Result Value   Sodium 134 (*)    Potassium 5.2 (*)    CO2 18 (*)    Glucose, Bld 166 (*)    BUN 34 (*)    Creatinine, Ser 1.94 (*)    GFR calc non Af Amer 23 (*)    GFR calc Af Amer 27 (*)    All other components within normal limits  CBC - Abnormal; Notable for the following components:   RBC 3.80 (*)    Hemoglobin 11.8 (*)    nRBC 0.3 (*)    All other components within normal limits  POC OCCULT BLOOD, ED  TROPONIN I (HIGH SENSITIVITY)  TROPONIN I (HIGH SENSITIVITY)    EKG EKG Interpretation  Date/Time:  Wednesday August 28 2018 11:33:02 EDT Ventricular Rate:  56 PR Interval:    QRS Duration: 134 QT Interval:  442 QTC Calculation: 426 R Axis:   -26 Text Interpretation:  Sinus rhythm Left bundle branch block Abnormal ECG Confirmed by Quintella Reichert [phone]) on 08/28/2018 12:11:47 PM   Radiology Dg Chest 2 View  Result Date: 08/28/2018 CLINICAL DATA:  Increased chest pressure EXAM: CHEST - 2 VIEW COMPARISON:  February 05, 2018 FINDINGS: The heart size and mediastinal contours are stable. Cardiac pacemaker is unchanged. Both lungs are clear. The visualized skeletal  structures are stable. IMPRESSION: No active cardiopulmonary disease. Electronically Signed   By: Abelardo Diesel M.D.   On: 08/28/2018 12:26   Ct Head Wo Contrast  Result Date: 08/28/2018 CLINICAL DATA:  Vertigo EXAM: CT HEAD WITHOUT CONTRAST TECHNIQUE: Contiguous axial images were obtained from the base of the skull through the vertex without intravenous contrast. COMPARISON:  None. FINDINGS: Brain: No acute territorial infarction, hemorrhage, or intracranial mass. Moderate atrophy. Moderate hypodensity in the white matter, felt consistent with small vessel ischemic change.  Ventricles are nonenlarged. Vascular: No hyperdense vessels.  Carotid vascular calcification Skull: Normal. Negative for fracture or focal lesion. Sinuses/Orbits: Small mucous retention cysts in the right maxillary sinus Other: None IMPRESSION: 1. No CT evidence for acute intracranial abnormality. 2. Atrophy and small vessel ischemic changes of the white matter. Electronically Signed   By: Donavan Foil M.D.   On: 08/28/2018 15:50    Procedures Procedures (including critical care time)  Medications Ordered in ED Medications  sodium chloride flush (NS) 0.9 % injection 3 mL (has no administration in time range)  sodium chloride 0.9 % bolus 250 mL (0 mLs Intravenous Stopped 08/28/18 1643)     Initial Impression / Assessment and Plan / ED Course  I have reviewed the triage vital signs and the nursing notes.  Pertinent labs & imaging results that were available during my care of the patient were reviewed by me and considered in my medical decision making (see chart for details).  Clinical Course as of Aug 28 1802  Wed Aug 28, 2018  1550 CT Head Wo Contrast [WF]    Clinical Course User Index [WF] Jerel Shepherd       Patient here for evaluation of dizziness, recurrent episodes over the last month. She is non-toxic appearing on evaluation with no focal neurologic deficits. She is mildly orthostatic in the emergency department. She was treated with gentle IV fluid hydration. Labs demonstrate stable renal insufficiency. She has very mild anemia with no reports of G.I. bleeding, hemoglobin is negative. Presentation is not consistent with CVA, hemorrhage, sepsis. Discussed with patient home care for ortho stasis. Discussed oral fluid hydration. Will discontinue her amlodipine at this time. Discussed importance of PCP and cardiology follow-up.  Final Clinical Impressions(s) / ED Diagnoses   Final diagnoses:  Dizziness  Orthostatic hypotension    ED Discharge Orders    None        Quintella Reichert, MD 08/28/18 1806

## 2018-08-28 NOTE — ED Notes (Signed)
[place] interrogating at this time

## 2018-08-28 NOTE — ED Notes (Signed)
Pt transported to CT ?

## 2018-08-30 ENCOUNTER — Telehealth: Payer: Self-pay | Admitting: Family Medicine

## 2018-08-30 NOTE — Telephone Encounter (Signed)
Spoke to patient.  Her diarrhea is getting better but she continues to have intermittent pain between her shoulder blades.  She had negative trop x2 in the ED.  However, I did advise her that if symptoms did not improve/ they get worse she is to get rechecked in the ED.  I agree with discontinuation of ACE-I given hyperkalemia.  Eliquis reduction needed for impaired renal function.  Metoprolol reduced 2/2 hypotension.

## 2018-09-02 DIAGNOSIS — H353211 Exudative age-related macular degeneration, right eye, with active choroidal neovascularization: Secondary | ICD-10-CM | POA: Diagnosis not present

## 2018-09-02 DIAGNOSIS — H353222 Exudative age-related macular degeneration, left eye, with inactive choroidal neovascularization: Secondary | ICD-10-CM | POA: Diagnosis not present

## 2018-09-02 DIAGNOSIS — H3561 Retinal hemorrhage, right eye: Secondary | ICD-10-CM | POA: Diagnosis not present

## 2018-09-02 DIAGNOSIS — H353124 Nonexudative age-related macular degeneration, left eye, advanced atrophic with subfoveal involvement: Secondary | ICD-10-CM | POA: Diagnosis not present

## 2018-09-02 DIAGNOSIS — H353212 Exudative age-related macular degeneration, right eye, with inactive choroidal neovascularization: Secondary | ICD-10-CM | POA: Diagnosis not present

## 2018-09-03 ENCOUNTER — Telehealth: Payer: Self-pay | Admitting: Family Medicine

## 2018-09-03 ENCOUNTER — Other Ambulatory Visit: Payer: Self-pay | Admitting: Family Medicine

## 2018-09-03 DIAGNOSIS — N183 Chronic kidney disease, stage 3 unspecified: Secondary | ICD-10-CM

## 2018-09-03 NOTE — Telephone Encounter (Signed)
-----   Message from [name], DO sent at 09/03/2018 12:31 PM EDT ----- Spoke to her nephrologist.  Norvasc dc'd.  ACE-I dc'd.  Please schedule RN visit for BP check and recheck BMP 2-3 weeks.  Will need to cc: Dr Maia Petties (nephrologist).

## 2018-09-03 NOTE — Telephone Encounter (Signed)
Patient aware and appt made 

## 2018-09-05 ENCOUNTER — Telehealth: Payer: Self-pay | Admitting: Cardiovascular Disease

## 2018-09-05 NOTE — Telephone Encounter (Signed)
° ° °  Pt c/o medication issue:  1. Name of Medication: amlodipine (Norvasc) 2.5mg   2. How are you currently taking this medication (dosage and times per day)? 2.5 mg   3. Are you having a reaction (difficulty breathing--STAT)? no  4. What is your medication issue? The patient states that she is to stop taking the medication, but the pharmacy has it on auto refill. The pharmacy just wants to make sure they do not need to refill the medication

## 2018-09-05 NOTE — Telephone Encounter (Signed)
Spoke to [name] advised Amlodipine was stopped. Spoke to patient she stated she recently went to ED for dizziness.Stated B/P was too low,Amlodipine was stopped.Stated she feels alittle better no dizziness.She has occasional chest tightness.She was concerned her medications have been changed.Appointment scheduled with Almyra Deforest PA 09/17/18 at 1:30 pm.Advised to check B/P daily and bring a list of readings to appointment.

## 2018-09-17 ENCOUNTER — Ambulatory Visit: Payer: Medicare Other | Admitting: Physician Assistant

## 2018-09-18 ENCOUNTER — Ambulatory Visit (INDEPENDENT_AMBULATORY_CARE_PROVIDER_SITE_OTHER): Payer: Medicare Other | Admitting: Family Medicine

## 2018-09-18 ENCOUNTER — Encounter: Payer: Self-pay | Admitting: Family Medicine

## 2018-09-18 DIAGNOSIS — K529 Noninfective gastroenteritis and colitis, unspecified: Secondary | ICD-10-CM | POA: Diagnosis not present

## 2018-09-18 DIAGNOSIS — R109 Unspecified abdominal pain: Secondary | ICD-10-CM | POA: Insufficient documentation

## 2018-09-18 DIAGNOSIS — K6289 Other specified diseases of anus and rectum: Secondary | ICD-10-CM

## 2018-09-18 DIAGNOSIS — R350 Frequency of micturition: Secondary | ICD-10-CM | POA: Diagnosis not present

## 2018-09-18 DIAGNOSIS — R300 Dysuria: Secondary | ICD-10-CM

## 2018-09-18 MED ORDER — AMOXICILLIN-POT CLAVULANATE 875-125 MG PO TABS: 1.0000 | ORAL_TABLET | Freq: Two times a day (BID) | ORAL | 0 refills | Status: AC

## 2018-09-18 NOTE — Progress Notes (Signed)
Virtual Visit via telephone Note Due to COVID-19 pandemic this visit was conducted virtually. This visit type was conducted due to national recommendations for restrictions regarding the COVID-19 Pandemic (e.g. social distancing, sheltering in place) in an effort to limit this patient's exposure and mitigate transmission in our community. All issues noted in this document were discussed and addressed.  A physical exam was not performed with this format.   I connected with [name] on 09/18/18 at 1220 by telephone and verified that I am speaking with the correct person using two identifiers. [name] is currently located at home and no one is currently with them during visit. The provider, Monia Pouch, FNP is located in their office at time of visit.  I discussed the limitations, risks, security and privacy concerns of performing an evaluation and management service by telephone and the availability of in person appointments. I also discussed with the patient that there may be a patient responsible charge related to this service. The patient expressed understanding and agreed to proceed.  Subjective:  Patient ID: [name], female    DOB: July 02, 1931, 85 y.o.   MRN: [id number]  Chief Complaint:  Dysuria, Rectal Pain, and Diarrhea   HPI: [name] is a 85 y.o. female presenting on 09/18/2018 for Dysuria, Rectal Pain, and Diarrhea   Dysuria  This is a new problem. The current episode started in the past 7 days. The problem occurs every urination. The problem has been gradually worsening. The quality of the pain is described as aching, burning and shooting. The pain is at a severity of 4/10. The pain is mild. There has been no fever. She is not sexually active. There is no history of pyelonephritis. Associated symptoms include frequency and urgency. Pertinent negatives include no chills, discharge, flank pain, hematuria, hesitancy, nausea, possible pregnancy, sweats or  vomiting. She has tried nothing for the symptoms.  Diarrhea  This is a chronic problem. The current episode started more than 1 year ago. The problem occurs 5 to 10 times per day. The problem has been waxing and waning. The stool consistency is described as watery (bright red blood streaking on toilet tissue after wiping). Pertinent negatives include no abdominal pain, arthralgias, bloating, chills, coughing, fever, headaches, increased  flatus, myalgias, sweats, URI, vomiting or weight loss. She has tried anti-motility drug and increased fluids for the symptoms. The treatment provided no relief. Her past medical history is significant for irritable bowel syndrome.     Relevant past medical, surgical, family, and social history reviewed and updated as indicated.  Allergies and medications reviewed and updated.   Past Medical History:  Diagnosis Date  . AICD (automatic cardioverter/defibrillator) present   . Allergy    SESONAL  . Anxiety   . ARTHRITIS   . Arthritis   . Atrial fibrillation ([place])   . CAD (coronary artery disease)    a. history of cardiac arrest 1993;  b. s/p LAD/LCX stenting in 2003;  c. 07/2011 Cath: LM nl, LAD patent stent, D1 80ost, LCX patent stent, RCA min irregs;  d. 04/2012 MV: EF 66%, no ishcemia;  e. 12/2013 Echo: Ef 55%, no rwma, Gr 1 DD, triv AI/MR, mildly dil LA;  f. 12/2013 Lexi MV: intermediate risk - apical ischemia and inf/infsept fixed defect, ? artifactual.  . CAD in native artery 12/31/2013   Previous stents to LAD and Ramus patent on cath today 12/31/13 also There is severe disease in the ostial first diagonal which is  unchanged from most recent cardiac catheterization. The right coronary artery could not be engaged selectively but nonselective angiography showed no significant disease in the proximal and midsegment.     . Cataract    DENIES  . Chronic back pain   . Chronic sinus bradycardia   . CKD (chronic kidney disease), stage III ([place])   . Clotting  disorder ([facility])    DVT  . COLITIS 12/02/2007   Qualifier: Diagnosis of  By: Nils Pyle CMA ([place]), [name]    . Diabetes mellitus without complication ([place])    DENIES  . Diverticulosis of colon (without mention of hemorrhage) 2007   Colonoscopy   . Esophageal stricture    a. 2012 s/p dil.  . Esophagitis, unspecified    a. 2012 EGD  . EXTERNAL HEMORRHOIDS   . GERD (gastroesophageal reflux disease)    omeprazole  . H/O hiatal hernia   . Hiatal hernia    a. 2012 EGD.  Marland Kitchen History of DVT in the past, not on Coumadin now    left  leg  . HYPERCHOLESTEROLEMIA   . Hypertension   . ICD (implantable cardiac defibrillator) in place    a. s/p initial ICD in 1993 in setting of cardiac arrest;  b. 01/2007 gen change: Guidant T135 Vitality DS VR single lead ICD.  . Macular degeneration    gets injecton in eye every 5 weeks- last injection - 05/03/2013   . Myocardial infarction ([place]) 1993  . Near syncope 05/22/2015  . Nephrolithiasis, just saw Dr [name]   . Orthostatic hypotension   . Peripheral vascular disease ([place])    ???  . RECTAL BLEEDING 12/03/2007   Qualifier: Diagnosis of  By: Sharlett Iles MD Byrd Hesselbach   . Unspecified gastritis and gastroduodenitis without mention of hemorrhage    a. 2003 EGD->not noted on 2012 EGD.  Marland Kitchen Unstable angina, neg MI, cath stable.maybe GI 12/30/2013    Past Surgical History:  Procedure Laterality Date  . BREAST BIOPSY Left 11/2013  . CARDIAC CATHETERIZATION    . CARDIAC CATHETERIZATION  01/01/15   patent stents -there is severe disease in ostial 1st diag but without change.   Marland Kitchen CARDIAC DEFIBRILLATOR PLACEMENT  02/05/2007   Guidant  . CARDIOVASCULAR STRESS TEST  12/30/2013   abnormal  . COLONOSCOPY    . coronary stents     . CYSTOSCOPY WITH URETEROSCOPY Right 05/20/2013   Procedure: CYSTOSCOPY, RIGHT URETEROSCOPY STONE EXTRACTION, Insertion of right DOUBLE J STENT ;  Surgeon: Irine Seal, MD;  Location: [facility];  Service: Urology;  Laterality: Right;  .  CYSTOSCOPY WITH URETEROSCOPY AND STENT PLACEMENT N/A 06/03/2013   Procedure: SECOND LOOK CYSTOSCOPY WITH URETEROSCOPY  HOLMIUM LASER LITHO AND STONE EXTRACTION Sammie Bench ;  Surgeon: Malka So, MD;  Location: [facility];  Service: Urology;  Laterality: N/A;  . GIVENS CAPSULE STUDY N/A 10/30/2016   Procedure: GIVENS CAPSULE STUDY;  Surgeon: Irene Shipper, MD;  Location: [facility];  Service: Endoscopy;  Laterality: N/A;  NEEDS TO BE ADMITTED FOR OBSERVATION PT. HAS DEFIB  . HEMORROIDECTOMY  80's  . HOLMIUM LASER APPLICATION Right [id number]   Procedure: HOLMIUM LASER APPLICATION;  Surgeon: Irine Seal, MD;  Location: [facility];  Service: Urology;  Laterality: Right;  . HYSTEROSCOPY W/D&C  09/02/2010   Procedure: DILATATION AND CURETTAGE (D&C) /HYSTEROSCOPY;  Surgeon: Margarette Asal;  Location: [name];  Service: Gynecology;  Laterality: N/A;  Dilation and Curettage with Hysteroscopy and Polypectomy  . IMPLANTABLE CARDIOVERTER DEFIBRILLATOR (ICD) GENERATOR  CHANGE N/A 02/10/2014   Procedure: ICD GENERATOR CHANGE;  Surgeon: Sanda Klein, MD;  Location[facility] C[facility];  Service: Cardiovascular;  Laterality: N/A;  . LEFT HEART CATHETERIZATION WITH CORONARY ANGIOGRAM N/A 07/28/2011   Procedure: LEFT HEART CATHETERIZATION WITH CORONARY ANGIOGRAM;  Surgeon: Lorretta Harp, MD;  Location: [facility] CATH LAB;  Service: Cardiovascular;  Laterality: N/A;  . LEFT HEART CATHETERIZATION WITH CORONARY ANGIOGRAM N/A 12/31/2013   Procedure: LEFT HEART CATHETERIZATION WITH CORONARY ANGIOGRAM;  Surgeon: Wellington Hampshire, MD;  Location: [facility] CATH LAB;  Service: Cardiovascular;  Laterality: N/A;    Social History   Socioeconomic History  . Marital status: Widowed    Spouse name: Not on file  . Number of children: Not on file  . Years of education: Not on file  . Highest education level: Not on file  Occupational History  . Not on file  Social Needs  . Financial resource strain: Not on file  . Food insecurity    Worry: Not on  file    Inability: Not on file  . Transportation needs    Medical: Not on file    Non-medical: Not on file  Tobacco Use  . Smoking status: Never Smoker  . Smokeless tobacco: Never Used  Substance and Sexual Activity  . Alcohol use: No  . Drug use: No  . Sexual activity: Never    Birth control/protection: Post-menopausal  Lifestyle  . Physical activity    Days per week: Not on file    Minutes per session: Not on file  . Stress: Not on file  Relationships  . Social Herbalist on phone: Not on file    Gets together: Not on file    Attends religious service: Not on file    Active member of club or organization: Not on file    Attends meetings of clubs or organizations: Not on file    Relationship status: Not on file  . Intimate partner violence    Fear of current or ex partner: Not on file    Emotionally abused: Not on file    Physically abused: Not on file    Forced sexual activity: Not on file  Other Topics Concern  . Not on file  Social History Narrative  . Not on file    Outpatient Encounter Medications as of 09/18/2018  Medication Sig  . amoxicillin-clavulanate (AUGMENTIN) 875-125 MG tablet Take 1 tablet by mouth 2 (two) times daily for 7 days.  Marland Kitchen apixaban (ELIQUIS) 2.5 MG TABS tablet Take 1 tablet (2.5 mg total) by mouth 2 (two) times daily.  . Calcium Citrate-Vitamin D (CITRACAL + D PO) Take 1 tablet by mouth daily.  . Cholecalciferol (VITAMIN D) 1000 UNITS capsule Take 2,000 Units by mouth daily.   . diphenoxylate-atropine (LOMOTIL) 2.5-0.025 MG tablet Take 1 tablet by mouth every 8 (eight) hours as needed for diarrhea or loose stools.  . famotidine (PEPCID) 20 MG tablet Take 1 tablet (20 mg total) by mouth at bedtime.  . metoprolol tartrate (LOPRESSOR) 25 MG tablet Take 1 tablet (25 mg total) by mouth 2 (two) times daily.  . Multiple Vitamins-Minerals (CENTRUM SILVER ULTRA WOMENS PO) Take 1 tablet by mouth daily.  . nitroGLYCERIN (NITROSTAT) 0.4 MG SL  tablet Place 1 tablet (0.4 mg total) under the tongue every 5 (five) minutes as needed for chest pain.  Marland Kitchen PARoxetine (PAXIL) 20 MG tablet TAKE (1) TABLET DAILY IN THE MORNING.  . rosuvastatin (CRESTOR) 40 MG tablet Take 0.5  tablets (20 mg total) by mouth daily.  Marland Kitchen torsemide (DEMADEX) 20 MG tablet Take 20 mg by mouth daily as needed (edema).  . [DISCONTINUED] amLODipine (NORVASC) 2.5 MG tablet Take 1 tablet (2.5 mg total) by mouth daily.   No facility-administered encounter medications on file as of 09/18/2018.     Allergies  Allergen Reactions  . Sotalol Other (See Comments)    Torsades   . Bactrim [Sulfamethoxazole-Trimethoprim]     Itchy rash  . Ciprofloxacin Nausea Only  . Actos [Pioglitazone] Swelling  . Adhesive [Tape] Other (See Comments)    Causes sores  . Contrast Media [Iodinated Diagnostic Agents] Other (See Comments)    Headache (no action/pre-med required)  . Latex Rash  . Lipitor [Atorvastatin] Other (See Comments)    myalgia    Review of Systems  Constitutional: Negative for activity change, appetite change, chills, diaphoresis, fatigue, fever, unexpected weight change and weight loss.  Respiratory: Negative for cough and shortness of breath.   Cardiovascular: Negative for chest pain and palpitations.  Gastrointestinal: Positive for anal bleeding, diarrhea and rectal pain. Negative for abdominal distention, abdominal pain, bloating, blood in stool, constipation, flatus, nausea and vomiting.  Genitourinary: Positive for dysuria, frequency and urgency. Negative for decreased urine volume, difficulty urinating, dyspareunia, flank pain, genital sores, hematuria, hesitancy, pelvic pain, vaginal bleeding, vaginal discharge and vaginal pain.  Musculoskeletal: Negative for arthralgias, back pain and myalgias.  Skin: Negative for color change and pallor.  Neurological: Negative for dizziness, weakness, light-headedness and headaches.  Psychiatric/Behavioral: Negative for  confusion.  All other systems reviewed and are negative.        Observations/Objective: No vital signs or physical exam, this was a telephone or virtual health encounter.  Pt alert and oriented, answers all questions appropriately, and able to speak in full sentences.    Assessment and Plan: [name] was seen today for dysuria, rectal pain and diarrhea.  Diagnoses and all orders for this visit:  Dysuria Frequency of micturition Dysuria, urgency, and frequency for 4 days. No red flags concerning for pyelonephritis or urosepsis. Previous urine cultures reviewed and antibiotic choice based on those results and pts multiple allergies. Symptomatic care discussed. Proper wiping techniques discussed. Increase water intake and avoid bladder irritants such as caffeine. Medications as prescribed. Report any new or worsening symptoms. Reevaluation in 2 weeks.  -     amoxicillin-clavulanate (AUGMENTIN) 875-125 MG tablet; Take 1 tablet by mouth 2 (two) times daily for 7 days.  Chronic diarrhea Rectal pain Reported ongoing, chronic diarrhea. Slight symptom relief with imodium and lomotil. Pt complaining of rectal burning with bowel movements. States she has bright red blood streaks with wiping but not in stool. Discussed keeping rectum covered with protective barrier creams. Due to chronicity of symptoms, will refer to GI for evaluation and possible colonoscopy. Report any new or worsening symptoms. Pt aware of symptoms the warrant evaluation in the emergency department.  -     Ambulatory referral to Gastroenterology     Follow Up Instructions: Return in about 2 weeks (around 10/02/2018), or if symptoms worsen or fail to improve, for urine recheck.    I discussed the assessment and treatment plan with the patient. The patient was provided an opportunity to ask questions and all were answered. The patient agreed with the plan and demonstrated an understanding of the instructions.   The patient was  advised to call back or seek an in-person evaluation if the symptoms worsen or if the condition fails to improve as anticipated.  The above assessment and management plan was discussed with the patient. The patient verbalized understanding of and has agreed to the management plan. Patient is aware to call the clinic if symptoms persist or worsen. Patient is aware when to return to the clinic for a follow-up visit. Patient educated on when it is appropriate to go to the emergency department.    I provided 25 minutes of non-face-to-face time during this encounter. The call started at 1220. The call ended at 1245. The other time was used for coordination of care.    Monia Pouch, FNP-C [facility] [address] [place], [place] [address] [phone] 09/18/18

## 2018-09-19 ENCOUNTER — Ambulatory Visit: Payer: Medicare Other

## 2018-09-28 ENCOUNTER — Other Ambulatory Visit: Payer: Self-pay | Admitting: Cardiovascular Disease

## 2018-10-02 ENCOUNTER — Telehealth: Payer: Self-pay | Admitting: Physician Assistant

## 2018-10-02 NOTE — Telephone Encounter (Signed)
Please cancel , and reschedule as soon as possible- pt should get in touch with her PCP regarding ? COVID testing

## 2018-10-02 NOTE — Telephone Encounter (Signed)
Pt is scheduled for OV with Amy tomorrow and stated that she has a cold.  She would like to know whether to come in or cx.

## 2018-10-02 NOTE — Telephone Encounter (Signed)
Amy-Please advise.

## 2018-10-03 ENCOUNTER — Ambulatory Visit: Payer: Medicare Other | Admitting: Physician Assistant

## 2018-10-03 NOTE — Telephone Encounter (Signed)
Patient cancelled yesterday

## 2018-10-10 ENCOUNTER — Telehealth: Payer: Self-pay | Admitting: Cardiovascular Disease

## 2018-10-10 NOTE — Telephone Encounter (Signed)
New Message  Pt c/o medication issue:  1. Name of Medication: amLODipine (NORVASC) 2.5 MG tablet  2. How are you currently taking this medication (dosage and times per day)?  Take 1 tablet (2.5 mg total) by mouth daily. 3. Are you having a reaction (difficulty breathing--STAT)? No   4. What is your medication issue? Patient states that her medication is being discontinued. Patient was unaware of this and wants some assistance on what she should do about it. Please give patient a call back to discuss.

## 2018-10-10 NOTE — Telephone Encounter (Signed)
Spoke with patient and she thinks nephrologist stopped her Amlodipine. Advised her to call nephrologist and see why it was d/c and continue to monitor blood pressure, bring to follow up 10/8

## 2018-10-11 ENCOUNTER — Telehealth: Payer: Self-pay | Admitting: Family Medicine

## 2018-10-14 ENCOUNTER — Ambulatory Visit: Payer: Medicare Other | Admitting: Family Medicine

## 2018-10-21 ENCOUNTER — Other Ambulatory Visit: Payer: Self-pay | Admitting: Family Medicine

## 2018-10-23 ENCOUNTER — Ambulatory Visit (INDEPENDENT_AMBULATORY_CARE_PROVIDER_SITE_OTHER): Payer: Medicare Other | Admitting: *Deleted

## 2018-10-23 ENCOUNTER — Encounter: Payer: Self-pay | Admitting: Family Medicine

## 2018-10-23 ENCOUNTER — Ambulatory Visit (INDEPENDENT_AMBULATORY_CARE_PROVIDER_SITE_OTHER): Payer: Medicare Other | Admitting: Family Medicine

## 2018-10-23 ENCOUNTER — Other Ambulatory Visit: Payer: Self-pay

## 2018-10-23 VITALS — BP 153/80 | HR 80 | Temp 96.7°F | Ht 63.0 in | Wt 148.0 lb

## 2018-10-23 DIAGNOSIS — L84 Corns and callosities: Secondary | ICD-10-CM | POA: Diagnosis not present

## 2018-10-23 DIAGNOSIS — Z23 Encounter for immunization: Secondary | ICD-10-CM

## 2018-10-23 DIAGNOSIS — I472 Ventricular tachycardia: Secondary | ICD-10-CM | POA: Diagnosis not present

## 2018-10-23 DIAGNOSIS — I480 Paroxysmal atrial fibrillation: Secondary | ICD-10-CM | POA: Diagnosis not present

## 2018-10-23 DIAGNOSIS — E1159 Type 2 diabetes mellitus with other circulatory complications: Secondary | ICD-10-CM

## 2018-10-23 DIAGNOSIS — E119 Type 2 diabetes mellitus without complications: Secondary | ICD-10-CM | POA: Diagnosis not present

## 2018-10-23 DIAGNOSIS — E785 Hyperlipidemia, unspecified: Secondary | ICD-10-CM

## 2018-10-23 DIAGNOSIS — E559 Vitamin D deficiency, unspecified: Secondary | ICD-10-CM

## 2018-10-23 DIAGNOSIS — N1832 Chronic kidney disease, stage 3b: Secondary | ICD-10-CM | POA: Diagnosis not present

## 2018-10-23 DIAGNOSIS — I4729 Other ventricular tachycardia: Secondary | ICD-10-CM

## 2018-10-23 DIAGNOSIS — E1169 Type 2 diabetes mellitus with other specified complication: Secondary | ICD-10-CM

## 2018-10-23 DIAGNOSIS — I10 Essential (primary) hypertension: Secondary | ICD-10-CM

## 2018-10-23 DIAGNOSIS — R200 Anesthesia of skin: Secondary | ICD-10-CM | POA: Diagnosis not present

## 2018-10-23 LAB — BAYER DCA HB A1C WAIVED: HB A1C (BAYER DCA - WAIVED): 7 % — ABNORMAL HIGH (ref <7.0)

## 2018-10-23 NOTE — Progress Notes (Signed)
Subjective: CC: est care, DM2, Afib, CKD PCP: [name], DO PYP:[id number] LASHAWNE DURA is a 85 y.o. female presenting to clinic today for:  1. Diet controlled Type 2 Diabetes w/ hypertension, hyperlipidemia and CKD 3:  Taking medication(s): Lisinopril 20 mg daily, Norvasc 5 mg daily, Crestor.  Last eye exam: has appt in July; has macular degeneration with 90% loss of vision in the left eye.  She is dependent upon her right eye for seeing Last foot exam: needs Last A1c:  Lab Results  Component Value Date   HGBA1C 7.1 (H) 07/12/2018   Nephropathy screen indicated?: UTD Last flu, zoster and/or pneumovax:  Immunization History  Administered Date(s) Administered  . Influenza, High Dose Seasonal PF 11/01/2015, 11/17/2016, 11/13/2017  . Influenza,inj,Quad PF,6+ Mos 11/11/2013, 10/21/2014  . Influenza-Unspecified 11/16/2012, 10/23/2016  . Pneumococcal Conjugate-13 11/18/2012  . Pneumococcal Polysaccharide-23 01/17/2003  . Tdap 05/09/2010  . Zoster 05/01/2011    ROS: denies chest pain, shortness of breath, dizziness.  She reports some ringing in the right ear and would like to have this checked today.  2. Afib Patient is chronically anticoagulated with Eliquis.  She does occasionally have heart palpitations and reports that she had an A. fib flare recently that gave her a pressure-like sensation in her chest.  She contacted her cardiologist and was told that if symptoms did not improve to seek medical attention in ED.  Symptoms resolved.  She has appt with Dr C tomorrow.  Denies any hematochezia, melena, hematuria or abnormal vaginal bleeding.  ROS: Per HPI  Allergies  Allergen Reactions  . Sotalol Other (See Comments)    Torsades   . Bactrim [Sulfamethoxazole-Trimethoprim]     Itchy rash  . Ciprofloxacin Nausea Only  . Actos [Pioglitazone] Swelling  . Adhesive [Tape] Other (See Comments)    Causes sores  . Contrast Media [Iodinated Diagnostic Agents] Other (See Comments)     Headache (no action/pre-med required)  . Latex Rash  . Lipitor [Atorvastatin] Other (See Comments)    myalgia   Past Medical History:  Diagnosis Date  . AICD (automatic cardioverter/defibrillator) present   . Allergy    SESONAL  . Anxiety   . ARTHRITIS   . Arthritis   . Atrial fibrillation ([place])   . CAD (coronary artery disease)    a. history of cardiac arrest 1993;  b. s/p LAD/LCX stenting in 2003;  c. 07/2011 Cath: LM nl, LAD patent stent, D1 80ost, LCX patent stent, RCA min irregs;  d. 04/2012 MV: EF 66%, no ishcemia;  e. 12/2013 Echo: Ef 55%, no rwma, Gr 1 DD, triv AI/MR, mildly dil LA;  f. 12/2013 Lexi MV: intermediate risk - apical ischemia and inf/infsept fixed defect, ? artifactual.  . CAD in native artery 12/31/2013   Previous stents to LAD and Ramus patent on cath today 12/31/13 also There is severe disease in the ostial first diagonal which is unchanged from most recent cardiac catheterization. The right coronary artery could not be engaged selectively but nonselective angiography showed no significant disease in the proximal and midsegment.     . Cataract    DENIES  . Chronic back pain   . Chronic sinus bradycardia   . CKD (chronic kidney disease), stage III ([place])   . Clotting disorder (HCC)    DVT  . COLITIS 12/02/2007   Qualifier: Diagnosis of  By: Nils Pyle CMA ([place]), [name]    . Diabetes mellitus without complication ([place])    DENIES  . Diverticulosis of colon (without  mention of hemorrhage) 2007   Colonoscopy   . Esophageal stricture    a. 2012 s/p dil.  . Esophagitis, unspecified    a. 2012 EGD  . EXTERNAL HEMORRHOIDS   . GERD (gastroesophageal reflux disease)    omeprazole  . H/O hiatal hernia   . Hiatal hernia    a. 2012 EGD.  Marland Kitchen History of DVT in the past, not on Coumadin now    left  leg  . HYPERCHOLESTEROLEMIA   . Hypertension   . ICD (implantable cardiac defibrillator) in place    a. s/p initial ICD in 1993 in setting of cardiac arrest;  b. 01/2007  gen change: Guidant T135 Vitality DS VR single lead ICD.  . Macular degeneration    gets injecton in eye every 5 weeks- last injection - 05/03/2013   . Myocardial infarction ([place]) 1993  . Near syncope 05/22/2015  . Nephrolithiasis, just saw Dr [name]   . Orthostatic hypotension   . Peripheral vascular disease ([place])    ???  . RECTAL BLEEDING 12/03/2007   Qualifier: Diagnosis of  By: Sharlett Iles MD Byrd Hesselbach   . Unspecified gastritis and gastroduodenitis without mention of hemorrhage    a. 2003 EGD->not noted on 2012 EGD.  Marland Kitchen Unstable angina, neg MI, cath stable.maybe GI 12/30/2013    Current Outpatient Medications:  .  apixaban (ELIQUIS) 2.5 MG TABS tablet, Take 1 tablet (2.5 mg total) by mouth 2 (two) times daily., Disp: 180 tablet, Rfl: 1 .  Calcium Citrate-Vitamin D (CITRACAL + D PO), Take 1 tablet by mouth daily., Disp: , Rfl:  .  Cholecalciferol (VITAMIN D) 1000 UNITS capsule, Take 2,000 Units by mouth daily. , Disp: , Rfl:  .  diphenoxylate-atropine (LOMOTIL) 2.5-0.025 MG tablet, Take 1 tablet by mouth every 8 (eight) hours as needed for diarrhea or loose stools., Disp: 30 tablet, Rfl: 1 .  famotidine (PEPCID) 20 MG tablet, Take 1 tablet (20 mg total) by mouth at bedtime., Disp: 30 tablet, Rfl: 3 .  metoprolol tartrate (LOPRESSOR) 25 MG tablet, Take 1 tablet (25 mg total) by mouth 2 (two) times daily., Disp: 180 tablet, Rfl: 1 .  Multiple Vitamins-Minerals (CENTRUM SILVER ULTRA WOMENS PO), Take 1 tablet by mouth daily., Disp: , Rfl:  .  nitroGLYCERIN (NITROSTAT) 0.4 MG SL tablet, Place 1 tablet (0.4 mg total) under the tongue every 5 (five) minutes as needed for chest pain., Disp: 25 tablet, Rfl: 4 .  PARoxetine (PAXIL) 20 MG tablet, TAKE (1) TABLET DAILY IN THE MORNING., Disp: 90 tablet, Rfl: 0 .  rosuvastatin (CRESTOR) 40 MG tablet, TAKE (1/2) TABLET DAILY., Disp: 45 tablet, Rfl: 0 .  torsemide (DEMADEX) 20 MG tablet, Take 20 mg by mouth daily as needed (edema)., Disp: , Rfl:   Social History   Socioeconomic History  . Marital status: Widowed    Spouse name: Not on file  . Number of children: Not on file  . Years of education: Not on file  . Highest education level: Not on file  Occupational History  . Not on file  Social Needs  . Financial resource strain: Not on file  . Food insecurity    Worry: Not on file    Inability: Not on file  . Transportation needs    Medical: Not on file    Non-medical: Not on file  Tobacco Use  . Smoking status: Never Smoker  . Smokeless tobacco: Never Used  Substance and Sexual Activity  . Alcohol use: No  .  Drug use: No  . Sexual activity: Never    Birth control/protection: Post-menopausal  Lifestyle  . Physical activity    Days per week: Not on file    Minutes per session: Not on file  . Stress: Not on file  Relationships  . Social Herbalist on phone: Not on file    Gets together: Not on file    Attends religious service: Not on file    Active member of club or organization: Not on file    Attends meetings of clubs or organizations: Not on file    Relationship status: Not on file  . Intimate partner violence    Fear of current or ex partner: Not on file    Emotionally abused: Not on file    Physically abused: Not on file    Forced sexual activity: Not on file  Other Topics Concern  . Not on file  Social History Narrative  . Not on file   Family History  Problem Relation Age of Onset  . Stroke Mother   . Other Mother        brain tumor  . Other Father        MI  . Heart attack Father   . Stroke Sister   . Macular degeneration Sister   . Diabetes Daughter   . Cancer Daughter        ovarian  . Colon polyps Daughter   . Atrial fibrillation Sister   . Hyperlipidemia Sister   . Osteoporosis Sister   . Stroke Sister   . Uterine cancer Sister   . Heart attack Brother   . Heart disease Brother   . Asthma Brother   . Breast cancer Neg Hx     Objective: Office vital signs reviewed.  BP (!) 153/80   Pulse 80   Temp (!) 96.7 F (35.9 C) (Temporal)   Ht 5' 3" (1.6 m)   Wt 148 lb (67.1 kg)   SpO2 97%   BMI 26.22 kg/m   Physical Examination:  General: Awake, alert, well nourished, elderly female. No acute distress HEENT: Normal. Sclera white, MMM  Cardio: regular rate and rhythm. S1S2 heard, no murmurs appreciated Pulm: clear to auscultation bilaterally, no wheezes, rhonchi or rales; normal work of breathing on room air Extremities: warm, well perfused, No edema, cyanosis or clubbing; +2 pulses bilaterally MSK: Antalgic gait, uses cane for ambulation. Neuro: see DM foot exam  Diabetic Foot Exam - Simple   Simple Foot Form Diabetic Foot exam was performed with the following findings: Yes 10/23/2018 12:50 PM  Visual Inspection See comments: Yes Sensation Testing See comments: Yes Pulse Check See comments: Yes Comments +1 pedal pulses bilaterally.  Feet are cool.  She has cracked calluses noted at bilateral medial aspects of the apex of the great toe.  There is no bleeding, appreciable secondary infection.  She also has a pre-ulcerative callus noted along the pinky toe of the left foot.  Monofilament sensation is decreased throughout.  Vibratory sensation decreased throughout.      Assessment/ Plan: 85 y.o. female   1. Diabetes mellitus type 2, diet-controlled ([place]) Controlled for age A1c 7.0.  No medicines needed.  Urine microalbumin obtained given discontinuation of ACE inhibitor (was having renal injury) - Bayer DCA Hb A1c Waived - CMP14+EGFR - Microalbumin / creatinine urine ratio  2. Hyperlipidemia associated with type 2 diabetes mellitus (HCC) Check fasting lipid - Lipid Panel - CMP14+EGFR  3. Hypertension associated with diabetes ([place])  Borderline uncontrolled but given history of syncope we will not adjust meds today. - CMP14+EGFR  4. Stage 3b chronic kidney disease Continue to follow with nephrology.  Labs ordered and will be CCed to her  nephrologist and cardiologist - VITAMIN D 25 Hydroxy (Vit-D Deficiency, Fractures) - CBC - CMP14+EGFR  5. Paroxysmal atrial fibrillation, chads2 Vasc2 score of 5, on eliquis Had recent flare.  Has scheduled follow-up with cardiology tomorrow  6. Vitamin D deficiency - VITAMIN D 25 Hydroxy (Vit-D Deficiency, Fractures)  7. Need for immunization against influenza Administered during today's visit - Flu Vaccine QUAD High Dose(Fluad)  8. Numbness in feet Her monofilament exam today shows decreased sensation throughout.  She also had notable preulcerative calluses, particularly along the left pinky toe.  I have placed a referral to podiatry for assistance.  I reinforced keeping an eye on her feet.  May need to consider diabetic shoes if she does not already on a pair - Ambulatory referral to Podiatry  9. Foot callus - Ambulatory referral to Podiatry   Orders Placed This Encounter  Procedures  . Bayer DCA Hb A1c Waived  . VITAMIN D 25 Hydroxy (Vit-D Deficiency, Fractures)  . CBC  . Lipid Panel  . CMP14+EGFR  . Microalbumin / creatinine urine ratio   No orders of the defined types were placed in this encounter.    [name], DO [place] [phone]

## 2018-10-23 NOTE — Patient Instructions (Signed)
You had labs performed today.  You will be contacted with the results of the labs once they are available, usually in the next 3 business days for routine lab work.  If you have an active my chart account, they will be released to your MyChart.  If you prefer to have these labs released to you via telephone, please let us know.  If you had a pap smear or biopsy performed, expect to be contacted in about 7-10 days.  

## 2018-10-24 ENCOUNTER — Encounter: Payer: Self-pay | Admitting: Cardiovascular Disease

## 2018-10-24 ENCOUNTER — Telehealth: Payer: Self-pay | Admitting: Family Medicine

## 2018-10-24 ENCOUNTER — Ambulatory Visit (INDEPENDENT_AMBULATORY_CARE_PROVIDER_SITE_OTHER): Payer: Medicare Other | Admitting: Cardiovascular Disease

## 2018-10-24 VITALS — BP 126/62 | HR 57 | Temp 96.6°F | Ht 63.0 in | Wt 150.3 lb

## 2018-10-24 DIAGNOSIS — I951 Orthostatic hypotension: Secondary | ICD-10-CM | POA: Diagnosis not present

## 2018-10-24 DIAGNOSIS — E7800 Pure hypercholesterolemia, unspecified: Secondary | ICD-10-CM

## 2018-10-24 DIAGNOSIS — N184 Chronic kidney disease, stage 4 (severe): Secondary | ICD-10-CM | POA: Diagnosis not present

## 2018-10-24 DIAGNOSIS — Z7901 Long term (current) use of anticoagulants: Secondary | ICD-10-CM | POA: Diagnosis not present

## 2018-10-24 DIAGNOSIS — Z95810 Presence of automatic (implantable) cardiac defibrillator: Secondary | ICD-10-CM

## 2018-10-24 DIAGNOSIS — I2510 Atherosclerotic heart disease of native coronary artery without angina pectoris: Secondary | ICD-10-CM | POA: Diagnosis not present

## 2018-10-24 DIAGNOSIS — I480 Paroxysmal atrial fibrillation: Secondary | ICD-10-CM | POA: Diagnosis not present

## 2018-10-24 DIAGNOSIS — I472 Ventricular tachycardia, unspecified: Secondary | ICD-10-CM

## 2018-10-24 LAB — CUP PACEART REMOTE DEVICE CHECK
Battery Remaining Longevity: 144 mo
Battery Remaining Percentage: 100 %
Brady Statistic RV Percent Paced: 1 %
Date Time Interrogation Session: 20201007062500
HighPow Impedance: 41 Ohm
Implantable Lead Implant Date: 20090120
Implantable Lead Location: 753860
Implantable Lead Model: 157
Implantable Lead Serial Number: 138237
Implantable Pulse Generator Implant Date: 20160126
Lead Channel Impedance Value: 611 Ohm
Lead Channel Pacing Threshold Amplitude: 0.8 V
Lead Channel Pacing Threshold Pulse Width: 0.5 ms
Lead Channel Setting Pacing Amplitude: 2.2 V
Lead Channel Setting Pacing Pulse Width: 0.5 ms
Lead Channel Setting Sensing Sensitivity: 0.6 mV
Pulse Gen Serial Number: 191956

## 2018-10-24 LAB — CMP14+EGFR
ALT: 14 IU/L (ref 0–32)
AST: 18 IU/L (ref 0–40)
Albumin/Globulin Ratio: 1.5 (ref 1.2–2.2)
Albumin: 4 g/dL (ref 3.6–4.6)
Alkaline Phosphatase: 106 IU/L (ref 39–117)
BUN/Creatinine Ratio: 11 — ABNORMAL LOW (ref 12–28)
BUN: 17 mg/dL (ref 8–27)
Bilirubin Total: 0.7 mg/dL (ref 0.0–1.2)
CO2: 18 mmol/L — ABNORMAL LOW (ref 20–29)
Calcium: 9.7 mg/dL (ref 8.7–10.3)
Chloride: 104 mmol/L (ref 96–106)
Creatinine, Ser: 1.5 mg/dL — ABNORMAL HIGH (ref 0.57–1.00)
GFR calc Af Amer: 36 mL/min/{1.73_m2} — ABNORMAL LOW (ref >59)
GFR calc non Af Amer: 32 mL/min/{1.73_m2} — ABNORMAL LOW (ref >59)
Globulin, Total: 2.7 g/dL (ref 1.5–4.5)
Glucose: 154 mg/dL — ABNORMAL HIGH (ref 65–99)
Potassium: 4.2 mmol/L (ref 3.5–5.2)
Sodium: 137 mmol/L (ref 134–144)
Total Protein: 6.7 g/dL (ref 6.0–8.5)

## 2018-10-24 LAB — CBC
Hematocrit: 37 % (ref 34.0–46.6)
Hemoglobin: 12.4 g/dL (ref 11.1–15.9)
MCH: 31.5 pg (ref 26.6–33.0)
MCHC: 33.5 g/dL (ref 31.5–35.7)
MCV: 94 fL (ref 79–97)
Platelets: 335 10*3/uL (ref 150–450)
RBC: 3.94 x10E6/uL (ref 3.77–5.28)
RDW: 13 % (ref 11.7–15.4)
WBC: 6 10*3/uL (ref 3.4–10.8)

## 2018-10-24 LAB — LIPID PANEL
Chol/HDL Ratio: 2.6 ratio (ref 0.0–4.4)
Cholesterol, Total: 139 mg/dL (ref 100–199)
HDL: 53 mg/dL (ref >39)
LDL Chol Calc (NIH): 65 mg/dL (ref 0–99)
Triglycerides: 119 mg/dL (ref 0–149)
VLDL Cholesterol Cal: 21 mg/dL (ref 5–40)

## 2018-10-24 LAB — MICROALBUMIN / CREATININE URINE RATIO
Creatinine, Urine: 48.4 mg/dL
Microalb/Creat Ratio: 292 mg/g creat — ABNORMAL HIGH (ref 0–29)
Microalbumin, Urine: 141.4 ug/mL

## 2018-10-24 LAB — VITAMIN D 25 HYDROXY (VIT D DEFICIENCY, FRACTURES): Vit D, 25-Hydroxy: 20 ng/mL — ABNORMAL LOW (ref 30.0–100.0)

## 2018-10-24 NOTE — Patient Instructions (Signed)
Medication Instructions:  Your physician recommends that you continue on your current medications as directed. Please refer to the Current Medication list given to you today.  If you need a refill on your cardiac medications before your next appointment, please call your pharmacy.   Lab work: None ordered If you have labs (blood work) drawn today and your tests are completely normal, you will receive your results only by: . MyChart Message (if you have MyChart) OR . A paper copy in the mail If you have any lab test that is abnormal or we need to change your treatment, we will call you to review the results.  Testing/Procedures: None ordered  Follow-Up: At [facility], you and your health needs are our priority.  As part of our continuing mission to provide you with exceptional heart care, we have created designated Provider Care Teams.  These Care Teams include your primary Cardiologist (physician) and Advanced Practice Providers (APPs -  Physician Assistants and Nurse Practitioners) who all work together to provide you with the care you need, when you need it. You will need a follow up appointment in 6 months.  Please call our office 2 months in advance to schedule this appointment.  You may see Mihai Croitoru, MD or one of the following Advanced Practice Providers on your designated Care Team: Hao Meng, PA-C . Angela Duke, PA-C   

## 2018-10-24 NOTE — Progress Notes (Signed)
. Patient ID: [name], female   DOB: 11/05/1931, 85 y.o.   MRN: [id number]    Cardiology Office Note    Date:  10/27/2018   ID:  [name], DOB 10-13-1931, MRN [id number]  PCP:  Renee Norlander, DO  Cardiologist:   Renee Klein, MD   Chief Complaint  Patient presents with  . Atrial Fibrillation  . Pacemaker Check    ICD; VT    History of Present Illness:  [name] is a 85 y.o. 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  She has not had any major health events since her last appointment but continues to complain of days when she feels poorly.  She wore an event monitor in July to see if there was correlation between known episodes of atrial fibrillation her complaints.  No atrial fibrillation was recorded during the monitoring period, but she did have persistent sinus bradycardia and we reduced her dose of beta-blocker.  She has a single-chamber defibrillator that cannot directly monitor at the episodes of atrial arrhythmia.  She has well-controlled ventricular rate during atrial fibrillation. In the past we have shown at least occasional correlation of her complaints of weakness with episodes of atrial fibrillation.  Event monitoring September 2019 showed episodes of lasted for 12-24 hours with good ventricular rate control.  No bradycardia was documented at that time. She does have significant evidence of intraventricular conduction normalities on surface ECG.   Interrogation of her device today does show that she had a long but nonsustained episode of ventricular tachycardia of 25 beats.  It did not temporally correlate with any of the symptoms she describes.  The patient specifically denies any chest pain at rest or with exertion, dyspnea at rest or with exertion, orthopnea, paroxysmal nocturnal dyspnea, syncope, palpitations, focal neurological deficits, intermittent claudication, lower extremity edema, unexplained weight gain, cough, hemoptysis or wheezing.   Interrogation of her defibrillator otherwise shows normal findings.  Single-chamber defibrillator shows no episodes of high ventricular rates.  Estimated generator longevity is 11 years.  She had a single 25 beat episode of nonsustained ventricular tachycardia..  She does not require ventricular pacing.   Her device is a Office manager device was implanted as a generator change in 2016.    She was on treatment with disopyramide for years for her history of catecholaminergic polymorphic VT, but this medication had to be gradually discontinued due to side effects, especially orthostatic hypotension and dry mouth.  In the past she developed torsades de pointes with sotalol and diarrhea with quinidine.  We have discussed the use of amiodarone to prevent atrial fibrillation episodes, but she did not want to take it because of the side effect profile.   Past Medical History:  Diagnosis Date  . AICD (automatic cardioverter/defibrillator) present   . Allergy    SESONAL  . Anxiety   . ARTHRITIS   . Arthritis   . Atrial fibrillation ([place])   . CAD (coronary artery disease)    a. history of cardiac arrest 1993;  b. s/p LAD/LCX stenting in 2003;  c. 07/2011 Cath: LM nl, LAD  patent stent, D1 80ost, LCX patent stent, RCA min irregs;  d. 04/2012 MV: EF 66%, no ishcemia;  e. 12/2013 Echo: Ef 55%, no rwma, Gr 1 DD, triv AI/MR, mildly dil LA;  f. 12/2013 Lexi MV: intermediate risk - apical ischemia and  inf/infsept fixed defect, ? artifactual.  . CAD in native artery 12/31/2013   Previous stents to LAD and Ramus patent on cath today 12/31/13 also There is severe disease in the ostial first diagonal which is unchanged from most recent cardiac catheterization. The right coronary artery could not be engaged selectively but nonselective angiography showed no significant disease in the proximal and midsegment.     . Cataract    DENIES  . Chronic back pain   . Chronic sinus bradycardia   . CKD (chronic kidney disease), stage III   . Clotting disorder (HCC)    DVT  . COLITIS 12/02/2007   Qualifier: Diagnosis of  By: Nils Pyle CMA ([place]), [name]    . Diabetes mellitus without complication ([place])    DENIES  . Diverticulosis of colon (without mention of hemorrhage) 2007   Colonoscopy   . Esophageal stricture    a. 2012 s/p dil.  . Esophagitis, unspecified    a. 2012 EGD  . EXTERNAL HEMORRHOIDS   . GERD (gastroesophageal reflux disease)    omeprazole  . H/O hiatal hernia   . Hiatal hernia    a. 2012 EGD.  Marland Kitchen History of DVT in the past, not on Coumadin now    left  leg  . HYPERCHOLESTEROLEMIA   . Hypertension   . ICD (implantable cardiac defibrillator) in place    a. s/p initial ICD in 1993 in setting of cardiac arrest;  b. 01/2007 gen change: Guidant T135 Vitality DS VR single lead ICD.  . Macular degeneration    gets injecton in eye every 5 weeks- last injection - 05/03/2013   . Myocardial infarction ([place]) 1993  . Near syncope 05/22/2015  . Nephrolithiasis, just saw Dr [name]   . Orthostatic hypotension   . Peripheral vascular disease ([place])    ???  . RECTAL BLEEDING 12/03/2007   Qualifier: Diagnosis of  By: Sharlett Iles MD Byrd Hesselbach   . Unspecified gastritis and gastroduodenitis without mention of hemorrhage    a. 2003 EGD->not noted on 2012 EGD.  Marland Kitchen Unstable angina, neg MI, cath stable.maybe GI 12/30/2013    Past Surgical History:  Procedure Laterality Date  . BREAST BIOPSY Left  11/2013  . CARDIAC CATHETERIZATION    . CARDIAC CATHETERIZATION  01/01/15   patent stents -there is severe disease in ostial 1st diag but without change.   Marland Kitchen CARDIAC DEFIBRILLATOR PLACEMENT  02/05/2007   Guidant  . CARDIOVASCULAR STRESS TEST  12/30/2013   abnormal  . COLONOSCOPY    . coronary stents     . CYSTOSCOPY WITH URETEROSCOPY Right 05/20/2013   Procedure: CYSTOSCOPY, RIGHT URETEROSCOPY STONE EXTRACTION, Insertion of right DOUBLE J STENT ;  Surgeon: Irine Seal, MD;  Location: [facility];  Service: Urology;  Laterality: Right;  . CYSTOSCOPY WITH URETEROSCOPY AND STENT PLACEMENT N/A 06/03/2013   Procedure: SECOND LOOK CYSTOSCOPY WITH URETEROSCOPY  HOLMIUM LASER LITHO AND STONE EXTRACTION Sammie Bench ;  Surgeon: Malka So, MD;  Location: [facility];  Service: Urology;  Laterality: N/A;  . GIVENS CAPSULE STUDY N/A 10/30/2016   Procedure: GIVENS CAPSULE STUDY;  Surgeon: Irene Shipper, MD;  Location: [facility];  Service: Endoscopy;  Laterality: N/A;  NEEDS TO BE ADMITTED FOR  OBSERVATION PT. HAS DEFIB  . HEMORROIDECTOMY  80's  . HOLMIUM LASER APPLICATION Right [id number]   Procedure: HOLMIUM LASER APPLICATION;  Surgeon: Irine Seal, MD;  Location: [facility];  Service: Urology;  Laterality: Right;  . HYSTEROSCOPY W/D&C  09/02/2010   Procedure: DILATATION AND CURETTAGE (D&C) /HYSTEROSCOPY;  Surgeon: Margarette Asal;  Location: [facility];  Service: Gynecology;  Laterality: N/A;  Dilation and Curettage with Hysteroscopy and Polypectomy  . IMPLANTABLE CARDIOVERTER DEFIBRILLATOR (ICD) GENERATOR CHANGE N/A 02/10/2014   Procedure: ICD GENERATOR CHANGE;  Surgeon: Renee Klein, MD;  Location: [facility];  Service: Cardiovascular;  Laterality: N/A;  . LEFT HEART CATHETERIZATION WITH CORONARY ANGIOGRAM N/A 07/28/2011   Procedure: LEFT HEART CATHETERIZATION WITH CORONARY ANGIOGRAM;  Surgeon: Lorretta Harp, MD;  Location: [facility];  Service: Cardiovascular;  Laterality: N/A;  . LEFT HEART CATHETERIZATION WITH  CORONARY ANGIOGRAM N/A 12/31/2013   Procedure: LEFT HEART CATHETERIZATION WITH CORONARY ANGIOGRAM;  Surgeon: Wellington Hampshire, MD;  Location: [facility];  Service: Cardiovascular;  Laterality: N/A;    Current Medications: Outpatient Medications Prior to Visit  Medication Sig Dispense Refill  . apixaban (ELIQUIS) 2.5 MG TABS tablet Take 1 tablet (2.5 mg total) by mouth 2 (two) times daily. 180 tablet 1  . Calcium Citrate-Vitamin D (CITRACAL + D PO) Take 1 tablet by mouth daily.    . Cholecalciferol (VITAMIN D) 1000 UNITS capsule Take 2,000 Units by mouth daily.     . diphenoxylate-atropine (LOMOTIL) 2.5-0.025 MG tablet Take 1 tablet by mouth every 8 (eight) hours as needed for diarrhea or loose stools. 30 tablet 1  . metoprolol tartrate (LOPRESSOR) 25 MG tablet Take 1 tablet (25 mg total) by mouth 2 (two) times daily. 180 tablet 1  . Multiple Vitamins-Minerals (CENTRUM SILVER ULTRA WOMENS PO) Take 1 tablet by mouth daily.    . nitroGLYCERIN (NITROSTAT) 0.4 MG SL tablet Place 1 tablet (0.4 mg total) under the tongue every 5 (five) minutes as needed for chest pain. 25 tablet 4  . pantoprazole (PROTONIX) 40 MG tablet Take 40 mg by mouth daily.    Marland Kitchen PARoxetine (PAXIL) 20 MG tablet TAKE (1) TABLET DAILY IN THE MORNING. 90 tablet 0  . rosuvastatin (CRESTOR) 40 MG tablet TAKE (1/2) TABLET DAILY. 45 tablet 0  . torsemide (DEMADEX) 20 MG tablet Take 20 mg by mouth daily as needed (edema).     No facility-administered medications prior to visit.      Allergies:   Sotalol, Bactrim [sulfamethoxazole-trimethoprim], Ciprofloxacin, Actos [pioglitazone], Adhesive [tape], Contrast media [iodinated diagnostic agents], Latex, and Lipitor [atorvastatin]   Social History   Socioeconomic History  . Marital status: Widowed    Spouse name: Not on file  . Number of children: Not on file  . Years of education: Not on file  . Highest education level: Not on file  Occupational History  . Not on file  Social  Needs  . Financial resource strain: Not on file  . Food insecurity    Worry: Not on file    Inability: Not on file  . Transportation needs    Medical: Not on file    Non-medical: Not on file  Tobacco Use  . Smoking status: Never Smoker  . Smokeless tobacco: Never Used  Substance and Sexual Activity  . Alcohol use: No  . Drug use: No  . Sexual activity: Never    Birth control/protection: Post-menopausal  Lifestyle  . Physical activity    Days per week: Not on file  Minutes per session: Not on file  . Stress: Not on file  Relationships  . Social Herbalist on phone: Not on file    Gets together: Not on file    Attends religious service: Not on file    Active member of club or organization: Not on file    Attends meetings of clubs or organizations: Not on file    Relationship status: Not on file  Other Topics Concern  . Not on file  Social History Narrative  . Not on file     Family History:  The patient's family history includes Asthma in her brother; Atrial fibrillation in her sister; Cancer in her daughter; Colon polyps in her daughter; Diabetes in her daughter; Heart attack in her brother and father; Heart disease in her brother; Hyperlipidemia in her sister; Macular degeneration in her sister; Osteoporosis in her sister; Other in her father and mother; Stroke in her mother, sister, and sister; Uterine cancer in her sister.   ROS:   Please see the history of present illness.    ROS all other systems are reviewed and are negative  PHYSICAL EXAM:   VS:  BP 126/62   Pulse (!) 57   Temp (!) 96.6 F (35.9 C)   Ht 5\' 3"  (1.6 m)   Wt 150 lb 4.8 oz (68.2 kg)   SpO2 93%   BMI 26.62 kg/m       General: Alert, oriented x3, no distress, left subclavian pacemaker site is protuberant, but appears healthy Head: no evidence of trauma, PERRL, EOMI, no exophtalmos or lid lag, no myxedema, no xanthelasma; normal ears, nose and oropharynx Neck: normal jugular venous  pulsations and no hepatojugular reflux; brisk carotid pulses without delay and no carotid bruits Chest: clear to auscultation, no signs of consolidation by percussion or palpation, normal fremitus, symmetrical and full respiratory excursions Cardiovascular: normal position and quality of the apical impulse, regular rhythm, normal first and second heart sounds, no murmurs, rubs or gallops Abdomen: no tenderness or distention, no masses by palpation, no abnormal pulsatility or arterial bruits, normal bowel sounds, no hepatosplenomegaly Extremities: no clubbing, cyanosis or edema; 2+ radial, ulnar and brachial pulses bilaterally; 2+ right femoral, posterior tibial and dorsalis pedis pulses; 2+ left femoral, posterior tibial and dorsalis pedis pulses; no subclavian or femoral bruits Neurological: grossly nonfocal Psych: Normal mood and affect    Wt Readings from Last 3 Encounters:  10/24/18 150 lb 4.8 oz (68.2 kg)  10/23/18 148 lb (67.1 kg)  07/22/18 155 lb (70.3 kg)    Studies/Labs Reviewed:   EKG:  EKG is not ordered today.  The tracing from August 29, 2018 shows sinus bradycardia with first-degree AV block (220 ms) and left bundle branch block (QRS 134 ms), remarkably short QT interval at 426 ms   Recent Labs: 02/05/2018: TSH 3.439 02/12/2018: Magnesium 1.9 10/23/2018: ALT 14; BUN 17; Creatinine, Ser 1.50; Hemoglobin 12.4; Platelets 335; Potassium 4.2; Sodium 137   Lipid Panel    Component Value Date/Time   CHOL 139 10/23/2018 0947   CHOL 140 07/31/2012 1019   TRIG 119 10/23/2018 0947   TRIG 115 06/07/2015 1111   TRIG 161 (H) 07/31/2012 1019   HDL 53 10/23/2018 0947   HDL 68 06/07/2015 1111   HDL 43 07/31/2012 1019   CHOLHDL 2.6 10/23/2018 0947   CHOLHDL 2.8 09/02/2010 0629   VLDL 34 09/02/2010 0629   LDLCALC 65 10/23/2018 0947   LDLCALC 65 09/09/2013 0841   LDLCALC  65 07/31/2012 1019     ASSESSMENT:    1. Paroxysmal atrial fibrillation (HCC)   2. VT (ventricular  tachycardia) ([place])   3. Long term current use of anticoagulant   4. Coronary artery disease involving native coronary artery of native heart without angina pectoris   5. Orthostatic hypotension   6. CKD (chronic kidney disease) stage 4, GFR 15-29 ml/min (HCC)   7. ICD (implantable cardioverter-defibrillator) in place   8. Hypercholesterolemia      PLAN:  In order of problems listed above:  1. AFib: Some correlation between paroxysmal atrial fibrillation and her so-called "weak spells" but this is not consistent.    She is fully anticoagulated. CHADSVasc 6 (age 59, gender, DM, CAD, hypertension).  There is a very short list of appropriate antiarrhythmics for her, due to her other cardiac problems.  In my opinion this is limited to amiodarone and Multaq and she has declined to take either. 2. VT: She has not had any therapy from her defibrillator since the early 1990s.  Episodes of nonsustained VT have been very infrequent, but she did have a rather lengthy 25 beat run in the last 3 months.  This happened after we decreased her beta-blockers due to sinus bradycardia.  Disopyramide was stopped several years ago due to complaints of dry mouth and orthostatic hypotension.  Continue to monitor. 3. Eliquis: Dose adjusted for age and worsening renal function.  No bleeding complications 4. CAD: Asymptomatic from this point of view.  She has normal left ventricular systolic function.  She does not have angina pectoris and has not required revascularization since 2003.  Her last assessment was a cardiac catheterization 2015 that did not show any significant coronary stenoses. 5. HTN/Orthostatic hypotension: Blood pressure is normal today. 6. DM: Controlled with diet, recent hemoglobin A1c 7.1% 7. CKD 3b-4: Creatinine appears to be better than the previous baseline approximately 1.9, now 1.5 (GFR improved from 25-32), despite ongoing treatment with diuretics for edema. 8. ICD: Normal single-chamber device  function.  Remote download in 3 months, follow-up visit in 6 months. 9. HLP: On statin, last LDL 55.  Medication Adjustments/Labs and Tests Ordered: Current medicines are reviewed at length with the patient today.  Concerns regarding medicines are outlined above.  Medication changes, Labs and Tests ordered today are listed in the Patient Instructions below. Patient Instructions  Medication Instructions:  Your physician recommends that you continue on your current medications as directed. Please refer to the Current Medication list given to you today.  If you need a refill on your cardiac medications before your next appointment, please call your pharmacy.   Lab work: None ordered If you have labs (blood work) drawn today and your tests are completely normal, you will receive your results only by: [place] (if you have MyChart) OR A paper copy in the mail If you have any lab test that is abnormal or we need to change your treatment, we will call you to review the results.  Testing/Procedures: None ordered  Follow-Up: At [facility], you and your health needs are our priority.  As part of our continuing mission to provide you with exceptional heart care, we have created designated Provider Care Teams.  These Care Teams include your primary Cardiologist (physician) and Advanced Practice Providers (APPs -  Physician Assistants and Nurse Practitioners) who all work together to provide you with the care you need, when you need it. You will need a follow up appointment in 6 months.  Please call  our office 2 months in advance to schedule this appointment.  You may see Renee Klein, MD or one of the following Advanced Practice Providers on your designated Care Team: Almyra Deforest, PA-C Fabian Sharp, [place]          Signed, Renee Klein, MD  10/27/2018 11:49 AM    [address], [place], [place]  [address] Phone: [phone]; Fax: [phone]

## 2018-10-25 ENCOUNTER — Other Ambulatory Visit: Payer: Self-pay | Admitting: Family Medicine

## 2018-10-25 DIAGNOSIS — E559 Vitamin D deficiency, unspecified: Secondary | ICD-10-CM

## 2018-10-25 MED ORDER — VITAMIN D (ERGOCALCIFEROL) 1.25 MG (50000 UNIT) PO CAPS: 50000.0000 [IU] | ORAL_CAPSULE | ORAL | 0 refills | Status: DC

## 2018-10-25 NOTE — Progress Notes (Signed)
Prescription vitamin D sent.  Take 1 capsule every week for 8 weeks.

## 2018-10-27 ENCOUNTER — Encounter: Payer: Self-pay | Admitting: Cardiovascular Disease

## 2018-10-28 ENCOUNTER — Other Ambulatory Visit: Payer: Self-pay | Admitting: Cardiovascular Disease

## 2018-10-28 ENCOUNTER — Ambulatory Visit: Payer: Medicare Other | Admitting: Physician Assistant

## 2018-10-29 ENCOUNTER — Telehealth: Payer: Self-pay | Admitting: Family Medicine

## 2018-10-29 NOTE — Telephone Encounter (Signed)
29f 68.2kg Scr 1.5 10/23/18 Lovw/croitoru 10/8 Possible dose change pharmd review needed

## 2018-10-29 NOTE — Telephone Encounter (Signed)
Did you call patient about a shot in her knee? Patient states that she received a call about having it done but couldn't hear the message good.

## 2018-10-29 NOTE — Telephone Encounter (Signed)
I did not.  Perhaps it was her orthopedist?

## 2018-10-30 NOTE — Telephone Encounter (Signed)
Pt aware to ortho

## 2018-10-30 NOTE — Telephone Encounter (Signed)
Possible dose change required needs a pharmd review

## 2018-11-01 NOTE — Progress Notes (Signed)
Remote ICD transmission.   

## 2018-11-06 DIAGNOSIS — H353211 Exudative age-related macular degeneration, right eye, with active choroidal neovascularization: Secondary | ICD-10-CM | POA: Diagnosis not present

## 2018-11-06 DIAGNOSIS — H353222 Exudative age-related macular degeneration, left eye, with inactive choroidal neovascularization: Secondary | ICD-10-CM | POA: Diagnosis not present

## 2018-11-06 DIAGNOSIS — H353124 Nonexudative age-related macular degeneration, left eye, advanced atrophic with subfoveal involvement: Secondary | ICD-10-CM | POA: Diagnosis not present

## 2018-11-06 DIAGNOSIS — H353212 Exudative age-related macular degeneration, right eye, with inactive choroidal neovascularization: Secondary | ICD-10-CM | POA: Diagnosis not present

## 2018-11-12 ENCOUNTER — Other Ambulatory Visit: Payer: Self-pay | Admitting: Cardiovascular Disease

## 2018-11-12 ENCOUNTER — Other Ambulatory Visit: Payer: Self-pay | Admitting: Family Medicine

## 2018-11-26 ENCOUNTER — Telehealth: Payer: Self-pay | Admitting: Family Medicine

## 2018-11-26 NOTE — Chronic Care Management (AMB) (Signed)
°  Chronic Care Management   Outreach Note  11/26/2018 Name: [name] MRN: [id number] DOB: [birth year]  Referred by: [name], DO Reason for referral : Chronic Care Management (Initial CCM outreach was unsuccessful.)   An unsuccessful telephone outreach was attempted today. The patient was referred to the case management team by for assistance with care management and care coordination.   Follow Up Plan: A HIPPA compliant phone message was left for the patient providing contact information and requesting a return call.  The care management team will reach out to the patient again over the next 7 days.  If patient returns call to provider office, please advise to call [place] at [place], [place] [facility]  [place], [place] [address] Direct Dial: [email or web address]  Website: [email or web address]

## 2018-12-02 ENCOUNTER — Other Ambulatory Visit: Payer: Self-pay | Admitting: Cardiovascular Disease

## 2018-12-03 NOTE — Chronic Care Management (AMB) (Signed)
°  Chronic Care Management   Outreach Note  12/03/2018 Name: [name] MRN: [id number] DOB: [birth year]  Referred by: [name], DO Reason for referral : Chronic Care Management (Initial CCM outreach was unsuccessful.) and Chronic Care Management (Second CCM outreach was unsuccessful. )   A second unsuccessful telephone outreach was attempted today. The patient was referred to the case management team for assistance with care management and care coordination.   Follow Up Plan: The care management team will reach out to the patient again over the next 7 days.   [place],  [id number] Direct Dial: [email or web address]  Website: [email or web address]

## 2018-12-10 DIAGNOSIS — H43813 Vitreous degeneration, bilateral: Secondary | ICD-10-CM | POA: Diagnosis not present

## 2018-12-10 DIAGNOSIS — H353124 Nonexudative age-related macular degeneration, left eye, advanced atrophic with subfoveal involvement: Secondary | ICD-10-CM | POA: Diagnosis not present

## 2018-12-10 DIAGNOSIS — H353212 Exudative age-related macular degeneration, right eye, with inactive choroidal neovascularization: Secondary | ICD-10-CM | POA: Diagnosis not present

## 2018-12-10 DIAGNOSIS — H353222 Exudative age-related macular degeneration, left eye, with inactive choroidal neovascularization: Secondary | ICD-10-CM | POA: Diagnosis not present

## 2018-12-10 DIAGNOSIS — H353211 Exudative age-related macular degeneration, right eye, with active choroidal neovascularization: Secondary | ICD-10-CM | POA: Diagnosis not present

## 2018-12-11 DIAGNOSIS — H353222 Exudative age-related macular degeneration, left eye, with inactive choroidal neovascularization: Secondary | ICD-10-CM | POA: Diagnosis not present

## 2018-12-11 DIAGNOSIS — H2512 Age-related nuclear cataract, left eye: Secondary | ICD-10-CM | POA: Diagnosis not present

## 2018-12-11 DIAGNOSIS — H43813 Vitreous degeneration, bilateral: Secondary | ICD-10-CM | POA: Diagnosis not present

## 2018-12-11 DIAGNOSIS — Z961 Presence of intraocular lens: Secondary | ICD-10-CM | POA: Diagnosis not present

## 2018-12-11 DIAGNOSIS — H04123 Dry eye syndrome of bilateral lacrimal glands: Secondary | ICD-10-CM | POA: Diagnosis not present

## 2018-12-11 DIAGNOSIS — H353211 Exudative age-related macular degeneration, right eye, with active choroidal neovascularization: Secondary | ICD-10-CM | POA: Diagnosis not present

## 2018-12-18 ENCOUNTER — Telehealth: Payer: Self-pay | Admitting: Family Medicine

## 2018-12-18 NOTE — Telephone Encounter (Signed)
Called patient multiple times line is busy

## 2018-12-19 ENCOUNTER — Encounter (HOSPITAL_COMMUNITY): Payer: Self-pay

## 2018-12-19 ENCOUNTER — Emergency Department (HOSPITAL_COMMUNITY)
Admission: EM | Admit: 2018-12-19 | Discharge: 2018-12-20 | Disposition: A | Discharge status: Home / Self Care | Payer: Medicare Other | Attending: Emergency Medicine | Admitting: Emergency Medicine

## 2018-12-19 ENCOUNTER — Emergency Department (HOSPITAL_COMMUNITY): Payer: Medicare Other

## 2018-12-19 ENCOUNTER — Other Ambulatory Visit: Payer: Self-pay

## 2018-12-19 DIAGNOSIS — Z7901 Long term (current) use of anticoagulants: Secondary | ICD-10-CM | POA: Insufficient documentation

## 2018-12-19 DIAGNOSIS — I129 Hypertensive chronic kidney disease with stage 1 through stage 4 chronic kidney disease, or unspecified chronic kidney disease: Secondary | ICD-10-CM | POA: Insufficient documentation

## 2018-12-19 DIAGNOSIS — Z95810 Presence of automatic (implantable) cardiac defibrillator: Secondary | ICD-10-CM | POA: Diagnosis not present

## 2018-12-19 DIAGNOSIS — E1122 Type 2 diabetes mellitus with diabetic chronic kidney disease: Secondary | ICD-10-CM | POA: Diagnosis not present

## 2018-12-19 DIAGNOSIS — Z79899 Other long term (current) drug therapy: Secondary | ICD-10-CM | POA: Insufficient documentation

## 2018-12-19 DIAGNOSIS — N200 Calculus of kidney: Secondary | ICD-10-CM | POA: Diagnosis not present

## 2018-12-19 DIAGNOSIS — I2510 Atherosclerotic heart disease of native coronary artery without angina pectoris: Secondary | ICD-10-CM | POA: Diagnosis not present

## 2018-12-19 DIAGNOSIS — R0989 Other specified symptoms and signs involving the circulatory and respiratory systems: Secondary | ICD-10-CM | POA: Diagnosis not present

## 2018-12-19 DIAGNOSIS — R531 Weakness: Secondary | ICD-10-CM | POA: Diagnosis not present

## 2018-12-19 DIAGNOSIS — N183 Chronic kidney disease, stage 3 unspecified: Secondary | ICD-10-CM | POA: Insufficient documentation

## 2018-12-19 DIAGNOSIS — R197 Diarrhea, unspecified: Secondary | ICD-10-CM

## 2018-12-19 DIAGNOSIS — E860 Dehydration: Secondary | ICD-10-CM | POA: Diagnosis not present

## 2018-12-19 DIAGNOSIS — Z91040 Latex allergy status: Secondary | ICD-10-CM | POA: Insufficient documentation

## 2018-12-19 DIAGNOSIS — Z20828 Contact with and (suspected) exposure to other viral communicable diseases: Secondary | ICD-10-CM | POA: Diagnosis not present

## 2018-12-19 DIAGNOSIS — I10 Essential (primary) hypertension: Secondary | ICD-10-CM | POA: Diagnosis not present

## 2018-12-19 DIAGNOSIS — R109 Unspecified abdominal pain: Secondary | ICD-10-CM | POA: Insufficient documentation

## 2018-12-19 DIAGNOSIS — U071 COVID-19: Secondary | ICD-10-CM | POA: Diagnosis not present

## 2018-12-19 DIAGNOSIS — R1110 Vomiting, unspecified: Secondary | ICD-10-CM | POA: Diagnosis not present

## 2018-12-19 DIAGNOSIS — R0689 Other abnormalities of breathing: Secondary | ICD-10-CM | POA: Diagnosis not present

## 2018-12-19 LAB — CBC WITH DIFFERENTIAL/PLATELET
Abs Immature Granulocytes: 0.02 10*3/uL (ref 0.00–0.07)
Basophils Absolute: 0 10*3/uL (ref 0.0–0.1)
Basophils Relative: 0 %
Eosinophils Absolute: 0.2 10*3/uL (ref 0.0–0.5)
Eosinophils Relative: 4 %
HCT: 36.7 % (ref 36.0–46.0)
Hemoglobin: 11.3 g/dL — ABNORMAL LOW (ref 12.0–15.0)
Immature Granulocytes: 1 %
Lymphocytes Relative: 25 %
Lymphs Abs: 1.1 10*3/uL (ref 0.7–4.0)
MCH: 30 pg (ref 26.0–34.0)
MCHC: 30.8 g/dL (ref 30.0–36.0)
MCV: 97.3 fL (ref 80.0–100.0)
Monocytes Absolute: 0.4 10*3/uL (ref 0.1–1.0)
Monocytes Relative: 10 %
Neutro Abs: 2.6 10*3/uL (ref 1.7–7.7)
Neutrophils Relative %: 60 %
Platelets: 204 10*3/uL (ref 150–400)
RBC: 3.77 MIL/uL — ABNORMAL LOW (ref 3.87–5.11)
RDW: 13.3 % (ref 11.5–15.5)
WBC: 4.3 10*3/uL (ref 4.0–10.5)
nRBC: 0 % (ref 0.0–0.2)

## 2018-12-19 LAB — URINALYSIS, ROUTINE W REFLEX MICROSCOPIC
Bilirubin Urine: NEGATIVE
Glucose, UA: NEGATIVE mg/dL
Hgb urine dipstick: NEGATIVE
Ketones, ur: NEGATIVE mg/dL
Nitrite: NEGATIVE
Protein, ur: NEGATIVE mg/dL
Specific Gravity, Urine: 1.003 — ABNORMAL LOW (ref 1.005–1.030)
pH: 5 (ref 5.0–8.0)

## 2018-12-19 LAB — COMPREHENSIVE METABOLIC PANEL
ALT: 21 U/L (ref 0–44)
AST: 29 U/L (ref 15–41)
Albumin: 3.6 g/dL (ref 3.5–5.0)
Alkaline Phosphatase: 63 U/L (ref 38–126)
Anion gap: 9 (ref 5–15)
BUN: 26 mg/dL — ABNORMAL HIGH (ref 8–23)
CO2: 19 mmol/L — ABNORMAL LOW (ref 22–32)
Calcium: 8.5 mg/dL — ABNORMAL LOW (ref 8.9–10.3)
Chloride: 107 mmol/L (ref 98–111)
Creatinine, Ser: 1.7 mg/dL — ABNORMAL HIGH (ref 0.44–1.00)
GFR calc Af Amer: 31 mL/min — ABNORMAL LOW (ref >60)
GFR calc non Af Amer: 27 mL/min — ABNORMAL LOW (ref >60)
Glucose, Bld: 138 mg/dL — ABNORMAL HIGH (ref 70–99)
Potassium: 3.8 mmol/L (ref 3.5–5.1)
Sodium: 135 mmol/L (ref 135–145)
Total Bilirubin: 0.5 mg/dL (ref 0.3–1.2)
Total Protein: 6.3 g/dL — ABNORMAL LOW (ref 6.5–8.1)

## 2018-12-19 LAB — LIPASE, BLOOD: Lipase: 36 U/L (ref 11–51)

## 2018-12-19 LAB — TROPONIN I (HIGH SENSITIVITY): Troponin I (High Sensitivity): 12 ng/L (ref <18)

## 2018-12-19 MED ORDER — METRONIDAZOLE 500 MG PO TABS: 500.0000 mg | ORAL_TABLET | Freq: Two times a day (BID) | ORAL | 0 refills | Status: DC

## 2018-12-19 MED ORDER — METRONIDAZOLE 500 MG PO TABS
500.0000 mg | ORAL_TABLET | Freq: Once | ORAL | Status: AC
  Administered 2018-12-19: 500 mg via ORAL
  Filled 2018-12-19: qty 1

## 2018-12-19 MED ORDER — SODIUM CHLORIDE 0.9 % IV BOLUS
500.0000 mL | Freq: Once | INTRAVENOUS | Status: AC
  Administered 2018-12-19: 500 mL via INTRAVENOUS

## 2018-12-19 MED ORDER — LOPERAMIDE HCL 2 MG PO CAPS
2.0000 mg | ORAL_CAPSULE | Freq: Once | ORAL | Status: AC
  Administered 2018-12-19: 2 mg via ORAL
  Filled 2018-12-19: qty 1

## 2018-12-19 NOTE — Telephone Encounter (Signed)
Pt scheduled for televisit with Dr Darnell Level 12/10 at 2:00.

## 2018-12-19 NOTE — ED Provider Notes (Signed)
[facility] EMERGENCY DEPARTMENT Provider Note   CSN: [id number] Arrival date & time: 12/19/18  2017     History   Chief Complaint No chief complaint on file.   HPI [name] is a 85 y.o. female.     Pt presents to the ED today with weakness.  She has also had n/v/d.  She has occasional abd pain.  No f/c.  No sob.  The pt she had an appt with [facility], but cancelled because she did not feel well (that was back in September).  The pt denies any known covid exposures.     Past Medical History:  Diagnosis Date  . AICD (automatic cardioverter/defibrillator) present   . Allergy    SESONAL  . Anxiety   . ARTHRITIS   . Arthritis   . Atrial fibrillation ([place])   . CAD (coronary artery disease)    a. history of cardiac arrest 1993;  b. s/p LAD/LCX stenting in 2003;  c. 07/2011 Cath: LM nl, LAD patent stent, D1 80ost, LCX patent stent, RCA min irregs;  d. 04/2012 MV: EF 66%, no ishcemia;  e. 12/2013 Echo: Ef 55%, no rwma, Gr 1 DD, triv AI/MR, mildly dil LA;  f. 12/2013 Lexi MV: intermediate risk - apical ischemia and inf/infsept fixed defect, ? artifactual.  . CAD in native artery 12/31/2013   Previous stents to LAD and Ramus patent on cath today 12/31/13 also There is severe disease in the ostial first diagonal which is unchanged from most recent cardiac catheterization. The right coronary artery could not be engaged selectively but nonselective angiography showed no significant disease in the proximal and midsegment.     . Cataract    DENIES  . Chronic back pain   . Chronic sinus bradycardia   . CKD (chronic kidney disease), stage III   . Clotting disorder (HCC)    DVT  . COLITIS 12/02/2007   Qualifier: Diagnosis of  By: Nils Pyle CMA ([place]), [name]    . Diabetes mellitus without complication ([place])    DENIES  . Diverticulosis of colon (without mention of hemorrhage) 2007   Colonoscopy   . Esophageal stricture    a. 2012 s/p dil.  . Esophagitis, unspecified    a. 2012  EGD  . EXTERNAL HEMORRHOIDS   . GERD (gastroesophageal reflux disease)    omeprazole  . H/O hiatal hernia   . Hiatal hernia    a. 2012 EGD.  Marland Kitchen History of DVT in the past, not on Coumadin now    left  leg  . HYPERCHOLESTEROLEMIA   . Hypertension   . ICD (implantable cardiac defibrillator) in place    a. s/p initial ICD in 1993 in setting of cardiac arrest;  b. 01/2007 gen change: Guidant T135 Vitality DS VR single lead ICD.  . Macular degeneration    gets injecton in eye every 5 weeks- last injection - 05/03/2013   . Myocardial infarction ([place]) 1993  . Near syncope 05/22/2015  . Nephrolithiasis, just saw Dr [name]   . Orthostatic hypotension   . Peripheral vascular disease ([place])    ???  . RECTAL BLEEDING 12/03/2007   Qualifier: Diagnosis of  By: Sharlett Iles MD Byrd Hesselbach   . Unspecified gastritis and gastroduodenitis without mention of hemorrhage    a. 2003 EGD->not noted on 2012 EGD.  Marland Kitchen Unstable angina, neg MI, cath stable.maybe GI 12/30/2013    Patient Active Problem List   Diagnosis Date Noted  . Chronic diarrhea 09/18/2018  .  Rectal pain 09/18/2018  . Osteoarthritis of right knee 03/29/2018  . Gastroesophageal reflux disease   . Hypertension associated with diabetes ([place]) 12/21/2017  . Degenerative scoliosis 02/19/2017  . Burst fracture of lumbar vertebra ([place]) 02/19/2017  . History of drug-induced prolonged QT interval with torsade de pointes 11/01/2016  . Iron deficiency anemia 10/30/2016  . Long term current use of anticoagulant 02/17/2016  . Diabetes mellitus type 2, diet-controlled ([place]) 09/14/2014  . ICD (implantable cardioverter-defibrillator) battery depletion 01/20/2014  . Abnormal nuclear stress test, 12/30/13 12/31/2013  . Coronary artery disease involving native coronary artery of native heart without angina pectoris 12/31/2013  . Paroxysmal atrial fibrillation, chads2 Vasc2 score of 5, on eliquis 10/16/2013  . Catecholaminergic polymorphic ventricular  tachycardia ([place]) 10/16/2013  . Ureteral stone with hydronephrosis 05/20/2013  . Metabolic syndrome [id number]  . Orthostatic hypotension 07/27/2011  . Chest pain, r/o cardiac, negative MI 07/25/2011  . Weakness 07/25/2011    Class: Acute  . Chronic sinus bradycardia 07/25/2011  . ICD (implantable cardioverter-defibrillator) in place 07/25/2011  . CKD (chronic kidney disease) stage 3, GFR 30-59 ml/min (HCC) 07/25/2011    Class: Chronic  . History of DVT in the past, on eliquis now 07/25/2011    Class: History of  . Nephrolithiasis, just saw Dr [name] 07/25/2011    Class: History of  . DYSPHAGIA 03/24/2010  . Chronic ischemic heart disease 12/03/2007  . Irritable bowel syndrome with diarrhea 12/03/2007  . EPIGASTRIC PAIN 12/03/2007  . Hyperlipidemia associated with type 2 diabetes mellitus ([place]) 12/02/2007    Class: History of  . EXTERNAL HEMORRHOIDS 12/02/2007    Class: History of  . ESOPHAGITIS 12/02/2007  . GASTRITIS 12/02/2007  . DIVERTICULOSIS, COLON 12/02/2007  . ARTHRITIS 12/02/2007    Class: Chronic    Past Surgical History:  Procedure Laterality Date  . BREAST BIOPSY Left 11/2013  . CARDIAC CATHETERIZATION    . CARDIAC CATHETERIZATION  01/01/15   patent stents -there is severe disease in ostial 1st diag but without change.   Marland Kitchen CARDIAC DEFIBRILLATOR PLACEMENT  02/05/2007   Guidant  . CARDIOVASCULAR STRESS TEST  12/30/2013   abnormal  . COLONOSCOPY    . coronary stents     . CYSTOSCOPY WITH URETEROSCOPY Right 05/20/2013   Procedure: CYSTOSCOPY, RIGHT URETEROSCOPY STONE EXTRACTION, Insertion of right DOUBLE J STENT ;  Surgeon: Irine Seal, MD;  Location: [facility];  Service: Urology;  Laterality: Right;  . CYSTOSCOPY WITH URETEROSCOPY AND STENT PLACEMENT N/A 06/03/2013   Procedure: SECOND LOOK CYSTOSCOPY WITH URETEROSCOPY  HOLMIUM LASER LITHO AND STONE EXTRACTION Sammie Bench ;  Surgeon: Malka So, MD;  Location: [facility];  Service: Urology;  Laterality: N/A;  . GIVENS  CAPSULE STUDY N/A 10/30/2016   Procedure: GIVENS CAPSULE STUDY;  Surgeon: Irene Shipper, MD;  Location: [facility];  Service: Endoscopy;  Laterality: N/A;  NEEDS TO BE ADMITTED FOR OBSERVATION PT. HAS DEFIB  . HEMORROIDECTOMY  80's  . HOLMIUM LASER APPLICATION Right [id number]   Procedure: HOLMIUM LASER APPLICATION;  Surgeon: Irine Seal, MD;  Location: [facility];  Service: Urology;  Laterality: Right;  . HYSTEROSCOPY W/D&C  09/02/2010   Procedure: DILATATION AND CURETTAGE (D&C) /HYSTEROSCOPY;  Surgeon: Margarette Asal;  Location: [facility];  Service: Gynecology;  Laterality: N/A;  Dilation and Curettage with Hysteroscopy and Polypectomy  . IMPLANTABLE CARDIOVERTER DEFIBRILLATOR (ICD) GENERATOR CHANGE N/A 02/10/2014   Procedure: ICD GENERATOR CHANGE;  Surgeon: Sanda Klein, MD;  Location: [facility];  Service: Cardiovascular;  Laterality: N/A;  . LEFT HEART CATHETERIZATION WITH CORONARY ANGIOGRAM N/A 07/28/2011   Procedure: LEFT HEART CATHETERIZATION WITH CORONARY ANGIOGRAM;  Surgeon: Lorretta Harp, MD;  Location: [facility] C[facility] LAB;  Service: Cardiovascular;  Laterality: N/A;  . LEFT HEART CATHETERIZATION WITH CORONARY ANGIOGRAM N/A 12/31/2013   Procedure: LEFT HEART CATHETERIZATION WITH CORONARY ANGIOGRAM;  Surgeon: Wellington Hampshire, MD;  Location: [facility] CATH LAB;  Service: Cardiovascular;  Laterality: N/A;     OB History   No obstetric history on file.      Home Medications    Prior to Admission medications   Medication Sig Start Date End Date Taking? Authorizing Provider  Calcium Citrate-Vitamin D (CITRACAL + D PO) Take 1 tablet by mouth daily.    [provider]  Cholecalciferol (VITAMIN D) 1000 UNITS capsule Take 2,000 Units by mouth daily.     [provider]  diphenoxylate-atropine (LOMOTIL) 2.5-0.025 MG tablet Take 1 tablet by mouth every 8 (eight) hours as needed for diarrhea or loose stools. 05/16/18 05/16/19  Chipper Herb, MD  ELIQUIS 2.5 MG TABS tablet TAKE  (1)   TABLET TWICE A DAY. 10/30/18   Croitoru, Mihai, MD  metoprolol tartrate (LOPRESSOR) 25 MG tablet TAKE  (1)  TABLET TWICE A DAY. 11/12/18   Croitoru, Mihai, MD  metroNIDAZOLE (FLAGYL) 500 MG tablet Take 1 tablet (500 mg total) by mouth 2 (two) times daily. 12/19/18   Isla Pence, MD  Multiple Vitamins-Minerals (CENTRUM SILVER ULTRA WOMENS PO) Take 1 tablet by mouth daily.    [provider]  nitroGLYCERIN (NITROSTAT) 0.4 MG SL tablet Place 1 tablet (0.4 mg total) under the tongue every 5 (five) minutes as needed for chest pain. 12/31/13   Isaiah Serge, NP  pantoprazole (PROTONIX) 40 MG tablet Take 40 mg by mouth daily. 09/30/18   [provider]  PARoxetine (PAXIL) 20 MG tablet TAKE (1) TABLET DAILY IN THE MORNING. 11/12/18   Ronnie Doss M, DO  rosuvastatin (CRESTOR) 40 MG tablet TAKE (1/2) TABLET DAILY. 10/22/18   [name], DO  torsemide (DEMADEX) 20 MG tablet Take 20 mg by mouth daily as needed (edema).    [provider]  Vitamin D, Ergocalciferol, (DRISDOL) 1.25 MG (50000 UT) CAPS capsule Take 1 capsule (50,000 Units total) by mouth every 7 (seven) days. 10/25/18   [name], DO  amLODipine (NORVASC) 2.5 MG tablet Take 1 tablet (2.5 mg total) by mouth daily. 03/04/18 08/28/18  Croitoru, Dani Gobble, MD    Family History Family History  Problem Relation Age of Onset  . Stroke Mother   . Other Mother        brain tumor  . Other Father        MI  . Heart attack Father   . Stroke Sister   . Macular degeneration Sister   . Diabetes Daughter   . Cancer Daughter        ovarian  . Colon polyps Daughter   . Atrial fibrillation Sister   . Hyperlipidemia Sister   . Osteoporosis Sister   . Stroke Sister   . Uterine cancer Sister   . Heart attack Brother   . Heart disease Brother   . Asthma Brother   . Breast cancer Neg Hx     Social History Social History   Tobacco Use  . Smoking status: Never Smoker  . Smokeless tobacco: Never Used   Substance Use Topics  . Alcohol use: No  . Drug use: No  Allergies   Sotalol, Bactrim [sulfamethoxazole-trimethoprim], Ciprofloxacin, Actos [pioglitazone], Adhesive [tape], Contrast media [iodinated diagnostic agents], Latex, and Lipitor [atorvastatin]   Review of Systems Review of Systems  Gastrointestinal: Positive for abdominal pain, diarrhea, nausea and vomiting.  All other systems reviewed and are negative.    Physical Exam Updated Vital Signs BP (!) 165/78   Pulse 61   Temp 98.1 F (36.7 C) (Oral)   Resp 14   Ht 5\' 3"  (1.6 m)   Wt 63.5 kg   SpO2 100%   BMI 24.80 kg/m   Physical Exam Vitals signs and nursing note reviewed.  Constitutional:      Appearance: Normal appearance.  HENT:     Head: Normocephalic and atraumatic.     Right Ear: External ear normal.     Left Ear: External ear normal.     Nose: Nose normal.     Mouth/Throat:     Mouth: Mucous membranes are moist.     Pharynx: Oropharynx is clear.  Eyes:     Extraocular Movements: Extraocular movements intact.     Conjunctiva/sclera: Conjunctivae normal.     Pupils: Pupils are equal, round, and reactive to light.  Neck:     Musculoskeletal: Normal range of motion and neck supple.  Cardiovascular:     Rate and Rhythm: Normal rate. Rhythm irregular.     Pulses: Normal pulses.     Heart sounds: Normal heart sounds.  Pulmonary:     Effort: Pulmonary effort is normal.     Breath sounds: Normal breath sounds.  Abdominal:     General: Abdomen is flat. Bowel sounds are normal.     Palpations: Abdomen is soft.  Musculoskeletal: Normal range of motion.  Skin:    General: Skin is warm.     Capillary Refill: Capillary refill takes less than 2 seconds.  Neurological:     General: No focal deficit present.     Mental Status: She is alert and oriented to person, place, and time.  Psychiatric:        Mood and Affect: Mood normal.        Behavior: Behavior normal.        Thought Content: Thought  content normal.        Judgment: Judgment normal.      ED Treatments / Results  Labs (all labs ordered are listed, but only abnormal results are displayed) Labs Reviewed  COMPREHENSIVE METABOLIC PANEL - Abnormal; Notable for the following components:      Result Value   CO2 19 (*)    Glucose, Bld 138 (*)    BUN 26 (*)    Creatinine, Ser 1.70 (*)    Calcium 8.5 (*)    Total Protein 6.3 (*)    GFR calc non Af Amer 27 (*)    GFR calc Af Amer 31 (*)    All other components within normal limits  CBC WITH DIFFERENTIAL/PLATELET - Abnormal; Notable for the following components:   RBC 3.77 (*)    Hemoglobin 11.3 (*)    All other components within normal limits  SARS CORONAVIRUS 2 (TAT 6-24 HRS)  LIPASE, BLOOD  URINALYSIS, ROUTINE W REFLEX MICROSCOPIC  TROPONIN I (HIGH SENSITIVITY)  TROPONIN I (HIGH SENSITIVITY)    EKG EKG Interpretation  Date/Time:  Thursday December 19 2018 20:27:45 EST Ventricular Rate:  64 PR Interval:    QRS Duration: 131 QT Interval:  447 QTC Calculation: 462 R Axis:   -53 Text Interpretation: Sinus rhythm Atrial premature complex  Prolonged PR interval Left bundle branch block No significant change since last tracing Confirmed by Isla Pence [phone]) on 12/19/2018 8:33:10 PM   Radiology Ct Abdomen Pelvis Wo Contrast  Result Date: 12/19/2018 CLINICAL DATA:  Nausea, vomiting EXAM: CT ABDOMEN AND PELVIS WITHOUT CONTRAST TECHNIQUE: Multidetector CT imaging of the abdomen and pelvis was performed following the standard protocol without IV contrast. COMPARISON:  02/05/2018 FINDINGS: Lower chest: Cardiomegaly.  No acute abnormality. Hepatobiliary: No focal hepatic abnormality. Gallbladder unremarkable. Pancreas: No focal abnormality or ductal dilatation. Spleen: No focal abnormality.  Normal size. Adrenals/Urinary Tract: Kidneys are atrophic, left greater than right. Scattered areas of calcifications. Nonobstructing bilateral renal stones. No ureteral stones or  hydronephrosis. Adrenal glands and urinary bladder unremarkable. Stomach/Bowel: Normal appendix. Portions of the colon are fluid-filled including the right colon and rectosigmoid colon. Stomach and small bowel decompressed. No evidence of bowel obstruction. Vascular/Lymphatic: Heavily calcified aorta. No evidence of aneurysm or adenopathy. Reproductive: Uterus and adnexa unremarkable.  No mass. Other: No free fluid or free air. Musculoskeletal: Degenerative changes throughout the lumbar spine. No acute bony abnormality. IMPRESSION: Atrophic kidneys bilaterally, left greater than right. Areas of calcifications throughout the kidneys as well as nonobstructing renal stones. No ureteral stones or hydronephrosis. Nonspecific bowel gas pattern with portions of the colon fluid-filled including cecum and rectosigmoid colon. No evidence of bowel obstruction. Cardiomegaly.  Aortic atherosclerosis. Electronically Signed   By: Rolm Baptise M.D.   On: 12/19/2018 21:05    Procedures Procedures (including critical care time)  Medications Ordered in ED Medications  metroNIDAZOLE (FLAGYL) tablet 500 mg (has no administration in time range)  loperamide (IMODIUM) capsule 2 mg (has no administration in time range)  sodium chloride 0.9 % bolus 500 mL (0 mLs Intravenous Stopped 12/19/18 2125)     Initial Impression / Assessment and Plan / ED Course  I have reviewed the triage vital signs and the nursing notes.  Pertinent labs & imaging results that were available during my care of the patient were reviewed by me and considered in my medical decision making (see chart for details).  Pt has a diagnosis of chronic diarrhea.  She said it is worse than normal.  Pt will be started on flagy.   CT abd/pelvis ok.  Labs ok.   Pt to f/u with GI.  Return if worse.  Final Clinical Impressions(s) / ED Diagnoses   Final diagnoses:  Diarrhea, unspecified type    ED Discharge Orders         Ordered    metroNIDAZOLE (FLAGYL)  500 MG tablet  2 times daily     12/19/18 2319           Isla Pence, MD 12/19/18 2332

## 2018-12-19 NOTE — Telephone Encounter (Signed)
Patient says she never received a call back. Wants nurse to call back asap regarding her initial call on 12/18/18.

## 2018-12-19 NOTE — ED Triage Notes (Signed)
E/MS reports being called out for generalized weakness, no appetite, diarrhea (Hx of IBS).   EMS gave approx 600 cc of iv fluid en route due to pt having diarrhea for 2 weeks.  Pt denies pain at this time.

## 2018-12-20 ENCOUNTER — Encounter: Payer: Self-pay | Admitting: Family Medicine

## 2018-12-20 ENCOUNTER — Other Ambulatory Visit: Payer: Self-pay | Admitting: Family Medicine

## 2018-12-20 ENCOUNTER — Ambulatory Visit (INDEPENDENT_AMBULATORY_CARE_PROVIDER_SITE_OTHER): Payer: Medicare Other | Admitting: Family Medicine

## 2018-12-20 ENCOUNTER — Other Ambulatory Visit: Payer: Self-pay | Admitting: Cardiovascular Disease

## 2018-12-20 ENCOUNTER — Telehealth: Payer: Self-pay | Admitting: Cardiovascular Disease

## 2018-12-20 DIAGNOSIS — I2510 Atherosclerotic heart disease of native coronary artery without angina pectoris: Secondary | ICD-10-CM

## 2018-12-20 DIAGNOSIS — U071 COVID-19: Secondary | ICD-10-CM | POA: Diagnosis not present

## 2018-12-20 DIAGNOSIS — E559 Vitamin D deficiency, unspecified: Secondary | ICD-10-CM

## 2018-12-20 LAB — SARS CORONAVIRUS 2 (TAT 6-24 HRS): SARS Coronavirus 2: POSITIVE — AB

## 2018-12-20 LAB — TROPONIN I (HIGH SENSITIVITY): Troponin I (High Sensitivity): 14 ng/L (ref <18)

## 2018-12-20 MED ORDER — AZITHROMYCIN 250 MG PO TABS: ORAL_TABLET | ORAL | 0 refills | Status: DC

## 2018-12-20 MED ORDER — HYDROXYCHLOROQUINE SULFATE 200 MG PO TABS: 400.0000 mg | ORAL_TABLET | Freq: Every day | ORAL | 0 refills | Status: DC

## 2018-12-20 MED ORDER — PREDNISONE 10 MG PO TABS: ORAL_TABLET | ORAL | 0 refills | Status: DC

## 2018-12-20 MED ORDER — NITROGLYCERIN 0.4 MG SL SUBL: 0.4000 mg | SUBLINGUAL_TABLET | SUBLINGUAL | 2 refills | Status: DC | PRN

## 2018-12-20 NOTE — Telephone Encounter (Signed)
Returned call to patient she stated she was feeling bad last night,weak,heart beating irregular.She called 911 and was taken to [facility] ED.Stated she was sent home this morning.Stated she just received a call that her covid test was positive.Stated she feels worse she is having chills,very weak.Chest congested.Stated her heart is out of rhythm.She is waiting on a call back from her PCP.Advised since she is worse she needs to call 911 and go back to hospital.

## 2018-12-20 NOTE — Telephone Encounter (Signed)
°*  STAT* If patient is at the pharmacy, call can be transferred to refill team.   1. Which medications need to be refilled? (please list name of each medication and dose if known) nitroGLYCERIN (NITROSTAT) 0.4 MG SL tablet  2. Which pharmacy/location (including street and city if local pharmacy) is medication to be sent to? [place], [place]  3. Do they need a 30 day or 90 day supply? 90 day

## 2018-12-20 NOTE — ED Notes (Signed)
Patient waiting on transportation home. Family conttacted.

## 2018-12-20 NOTE — Telephone Encounter (Signed)
Patient c/o Palpitations:  High priority if patient c/o lightheadedness, shortness of breath, or chest pain  1) How long have you had palpitations/irregular HR/ Afib? Are you having the symptoms now? 2 or 3 days ago, Is having symptoms of Afib now   2) Are you currently experiencing lightheadedness, SOB or CP? No  3) Do you have a history of afib (atrial fibrillation) or irregular heart rhythm? Yes  4) Have you checked your BP or HR? (document readings if available): No  5) Are you experiencing any other symptoms? Congested cough   Patient went to ER last night 12/19/18 and went home around 2 AM 12/20/18. She was called back today around 2 PM by the ER stating she was tested positive for Covid. She states she can't eat and would like to know what Dr. Sallyanne Kuster thinks she should do.

## 2018-12-20 NOTE — Progress Notes (Signed)
Subjective:    Patient ID: [name], female    DOB: [birth year], 85 y.o.   MRN: [id number]   HPI: [name] is a 85 y.o. female presenting for congestion. Not bad in her lungs. Coughing a lot. Nonproductive. Diarrhea, weak. Onset a week ago and getting worse. Went by ambulance yesterday to [facility]  Was hoping they would give her an IV to help with her weakness, but they didn't. Went home today then they called and told her it was positive. Some low grade fever. Denies dyspnea.  Depression screen [facility] 2/9 10/23/2018 07/12/2018 02/12/2018 01/30/2018 12/31/2017  Decreased Interest 0 0 0 0 0  Down, Depressed, Hopeless 0 0 0 0 0  PHQ - 2 Score 0 0 0 0 0  Altered sleeping 0 0 0 - -  Tired, decreased energy 0 0 0 - -  Change in appetite 0 0 0 - -  Feeling bad or failure about yourself  0 0 0 - -  Trouble concentrating 0 0 0 - -  Moving slowly or fidgety/restless 0 0 0 - -  Suicidal thoughts 0 0 0 - -  PHQ-9 Score 0 0 0 - -  Some recent data might be hidden     Relevant past medical, surgical, family and social history reviewed and updated as indicated.  Interim medical history since our last visit reviewed. Allergies and medications reviewed and updated.  ROS:  Review of Systems  Constitutional: Negative for activity change, appetite change, chills and fever.  HENT: Positive for congestion, postnasal drip, rhinorrhea and sinus pressure. Negative for ear discharge, ear pain, hearing loss, nosebleeds, sneezing and trouble swallowing.   Respiratory: Negative for chest tightness and shortness of breath.   Cardiovascular: Negative for chest pain and palpitations.  Skin: Negative for rash.     Social History   Tobacco Use  Smoking Status Never Smoker  Smokeless Tobacco Never Used       Objective:     Wt Readings from Last 3 Encounters:  12/19/18 140 lb (63.5 kg)  10/24/18 150 lb 4.8 oz (68.2 kg)  10/23/18 148 lb (67.1 kg)     Exam deferred. Pt. [place]  due to COVID 19. Phone visit performed.   Assessment & Plan:   1. COVID-19 virus infection     Meds ordered this encounter  Medications  . hydroxychloroquine (PLAQUENIL) 200 MG tablet    Sig: Take 2 tablets (400 mg total) by mouth daily.    Dispense:  40 tablet    Refill:  0  . predniSONE (DELTASONE) 10 MG tablet    Sig: Take 5 daily for 2 days followed by 4,3,2 and 1 for 2 days each.    Dispense:  30 tablet    Refill:  0  . azithromycin (ZITHROMAX Z-PAK) 250 MG tablet    Sig: Take two right away Then one a day for the next 4 days.    Dispense:  6 each    Refill:  0  . nitroGLYCERIN (NITROSTAT) 0.4 MG SL tablet    Sig: Place 1 tablet (0.4 mg total) under the tongue every 5 (five) minutes as needed for chest pain.    Dispense:  25 tablet    Refill:  2    No orders of the defined types were placed in this encounter.     Diagnoses and all orders for this visit:  COVID-19 virus infection  Other orders -     hydroxychloroquine (PLAQUENIL) 200  MG tablet; Take 2 tablets (400 mg total) by mouth daily. -     predniSONE (DELTASONE) 10 MG tablet; Take 5 daily for 2 days followed by 4,3,2 and 1 for 2 days each. -     azithromycin (ZITHROMAX Z-PAK) 250 MG tablet; Take two right away Then one a day for the next 4 days. -     nitroGLYCERIN (NITROSTAT) 0.4 MG SL tablet; Place 1 tablet (0.4 mg total) under the tongue every 5 (five) minutes as needed for chest pain.    Virtual Visit via telephone Note  I discussed the limitations, risks, security and privacy concerns of performing an evaluation and management service by telephone and the availability of in person appointments. The patient was identified with two identifiers. Pt.expressed understanding and agreed to proceed. Pt. Is at home. Dr. Livia Snellen is in his office.  Follow Up Instructions:   I discussed the assessment and treatment plan with the patient. The patient was provided an opportunity to ask questions and all were answered.  The patient agreed with the plan and demonstrated an understanding of the instructions.   The patient was advised to call back or seek an in-person evaluation if the symptoms worsen or if the condition fails to improve as anticipated.   Total minutes including chart review and phone contact time: 16   Follow up plan: No follow-ups on file.  Claretta Fraise, MD [place]

## 2018-12-21 ENCOUNTER — Telehealth: Payer: Self-pay | Admitting: Nurse Practitioner

## 2018-12-21 NOTE — Telephone Encounter (Signed)
Attempt to call to discuss about Covid symptoms and the use of bamlanivimab, a monoclonal antibody infusion for those with mild to moderate Covid symptoms and at a high risk of hospitalization.  Pt is qualified for this infusion at the [facility] infusion center due to age > 20 and chronic health issues.  No answer, general HIPAA compliant message left.

## 2018-12-25 NOTE — Chronic Care Management (AMB) (Signed)
°  Chronic Care Management   Outreach Note  12/25/2018 Name: [name] MRN: [id number] DOB: [birth year]  Referred by: [name], DO Reason for referral : Chronic Care Management (Initial CCM outreach was unsuccessful.), Chronic Care Management (Second CCM outreach was unsuccessful. ), and Chronic Care Management (Third CCM outreach was unsuccessful. )   Third unsuccessful telephone outreach was attempted today. The patient was referred to the case management team for assistance with care management and care coordination. The patient's primary care provider has been notified of our unsuccessful attempts to make or maintain contact with the patient. The care management team is pleased to engage with this patient at any time in the future should he/she be interested in assistance from the care management team.   Follow Up Plan: The care management team is available to follow up with the patient after provider conversation with the patient regarding recommendation for care management engagement and subsequent re-referral to the care management team.   [place] [facility], [place] [facility]  [place], [place] [address] Direct Dial: [email or web address]  Website: [email or web address]

## 2018-12-26 ENCOUNTER — Ambulatory Visit (INDEPENDENT_AMBULATORY_CARE_PROVIDER_SITE_OTHER): Payer: Medicare Other | Admitting: Family Medicine

## 2018-12-26 DIAGNOSIS — F329 Major depressive disorder, single episode, unspecified: Secondary | ICD-10-CM

## 2018-12-26 DIAGNOSIS — F32A Depression, unspecified: Secondary | ICD-10-CM

## 2018-12-26 MED ORDER — MIRTAZAPINE 7.5 MG PO TABS: 7.5000 mg | ORAL_TABLET | Freq: Every day | ORAL | 0 refills | Status: DC

## 2018-12-26 NOTE — Patient Instructions (Signed)
Taking the medicine as directed and not missing any doses is one of the best things you can do to treat your depression.  Here are some things to keep in mind:  1) Side effects (stomach upset, some increased anxiety) may happen before you notice a benefit.  These side effects typically go away over time. 2) Changes to your dose of medicine or a change in medication all together is sometimes necessary 3) Most people need to be on medication at least 12 months 4) Many people will notice an improvement within two weeks but the full effect of the medication can take up to 4-6 weeks 5) Stopping the medication when you start feeling better often results in a return of symptoms 6) Never discontinue your medication without contacting a health care professional first.  Some medications require gradual discontinuation/ taper and can make you sick if you stop them abruptly.  If your symptoms worsen or you have thoughts of suicide/homicide, PLEASE SEEK IMMEDIATE MEDICAL ATTENTION.  You may always call:  National Suicide Hotline: 800-273-8255 [place] Crisis Line: 336-832-9700 Crisis Recovery in [place]: 800-939-5911   These are available 24 hours a day, 7 days a week.   

## 2018-12-26 NOTE — Progress Notes (Signed)
Telephone visit  Subjective: [id number] PCP: [name], DO [id number] [name] is a 85 y.o. female calls for telephone consult today. Patient provides verbal consent for consult held via phone.  Location of patient: home Location of provider: Working remotely from home Others present for call: none  1. Depression Patient with known depressive disorder currently treated with Paxil.  She notes that she does not feel the medication is especially helpful and she would like to start something else.  She identifies her recent COVID-19 infection as something that has increased stress and depressive symptoms.  She reports low appetite, decreased motivation and concentration.  Often she will wake up feeling okay but towards the end of the day she starts feeling lonely and depressed.  She resides alone.  Several of her family members are also affected by COVID-19.  No SI, HI, visual or auditory hallucinations.   ROS: Per HPI  Allergies  Allergen Reactions  . Sotalol Other (See Comments)    Torsades   . Bactrim [Sulfamethoxazole-Trimethoprim]     Itchy rash  . Ciprofloxacin Nausea Only  . Actos [Pioglitazone] Swelling  . Adhesive [Tape] Other (See Comments)    Causes sores  . Contrast Media [Iodinated Diagnostic Agents] Other (See Comments)    Headache (no action/pre-med required)  . Latex Rash  . Lipitor [Atorvastatin] Other (See Comments)    myalgia   Past Medical History:  Diagnosis Date  . AICD (automatic cardioverter/defibrillator) present   . Allergy    SESONAL  . Anxiety   . ARTHRITIS   . Arthritis   . Atrial fibrillation ([place])   . CAD (coronary artery disease)    a. history of cardiac arrest 1993;  b. s/p LAD/LCX stenting in 2003;  c. 07/2011 Cath: LM nl, LAD patent stent, D1 80ost, LCX patent stent, RCA min irregs;  d. 04/2012 MV: EF 66%, no ishcemia;  e. 12/2013 Echo: Ef 55%, no rwma, Gr 1 DD, triv AI/MR, mildly dil LA;  f. 12/2013 Lexi MV: intermediate risk -  apical ischemia and inf/infsept fixed defect, ? artifactual.  . CAD in native artery 12/31/2013   Previous stents to LAD and Ramus patent on cath today 12/31/13 also There is severe disease in the ostial first diagonal which is unchanged from most recent cardiac catheterization. The right coronary artery could not be engaged selectively but nonselective angiography showed no significant disease in the proximal and midsegment.     . Cataract    DENIES  . Chronic back pain   . Chronic sinus bradycardia   . CKD (chronic kidney disease), stage III   . Clotting disorder (HCC)    DVT  . COLITIS 12/02/2007   Qualifier: Diagnosis of  By: Nils Pyle CMA ([place]), [name]    . Diabetes mellitus without complication ([place])    DENIES  . Diverticulosis of colon (without mention of hemorrhage) 2007   Colonoscopy   . Esophageal stricture    a. 2012 s/p dil.  . Esophagitis, unspecified    a. 2012 EGD  . EXTERNAL HEMORRHOIDS   . GERD (gastroesophageal reflux disease)    omeprazole  . H/O hiatal hernia   . Hiatal hernia    a. 2012 EGD.  Marland Kitchen History of DVT in the past, not on Coumadin now    left  leg  . HYPERCHOLESTEROLEMIA   . Hypertension   . ICD (implantable cardiac defibrillator) in place    a. s/p initial ICD in 1993 in setting of cardiac arrest;  b. 01/2007  gen change: Guidant T135 Vitality DS VR single lead ICD.  . Macular degeneration    gets injecton in eye every 5 weeks- last injection - 05/03/2013   . Myocardial infarction ([place]) 1993  . Near syncope 05/22/2015  . Nephrolithiasis, just saw Dr [name]   . Orthostatic hypotension   . Peripheral vascular disease ([place])    ???  . RECTAL BLEEDING 12/03/2007   Qualifier: Diagnosis of  By: Sharlett Iles MD Byrd Hesselbach   . Unspecified gastritis and gastroduodenitis without mention of hemorrhage    a. 2003 EGD->not noted on 2012 EGD.  Marland Kitchen Unstable angina, neg MI, cath stable.maybe GI 12/30/2013    Current Outpatient Medications:  .  Calcium  Citrate-Vitamin D (CITRACAL + D PO), Take 1 tablet by mouth daily., Disp: , Rfl:  .  Cholecalciferol (VITAMIN D) 1000 UNITS capsule, Take 2,000 Units by mouth daily. , Disp: , Rfl:  .  diphenoxylate-atropine (LOMOTIL) 2.5-0.025 MG tablet, Take 1 tablet by mouth every 8 (eight) hours as needed for diarrhea or loose stools., Disp: 30 tablet, Rfl: 1 .  ELIQUIS 2.5 MG TABS tablet, TAKE  (1)  TABLET TWICE A DAY., Disp: 180 tablet, Rfl: 1 .  metoprolol tartrate (LOPRESSOR) 25 MG tablet, TAKE  (1)  TABLET TWICE A DAY., Disp: 180 tablet, Rfl: 0 .  Multiple Vitamins-Minerals (CENTRUM SILVER ULTRA WOMENS PO), Take 1 tablet by mouth daily., Disp: , Rfl:  .  nitroGLYCERIN (NITROSTAT) 0.4 MG SL tablet, Place 1 tablet (0.4 mg total) under the tongue every 5 (five) minutes as needed for chest pain., Disp: 25 tablet, Rfl: 2 .  pantoprazole (PROTONIX) 40 MG tablet, Take 40 mg by mouth daily., Disp: , Rfl:  .  PARoxetine (PAXIL) 20 MG tablet, TAKE (1) TABLET DAILY IN THE MORNING., Disp: 90 tablet, Rfl: 0 .  predniSONE (DELTASONE) 10 MG tablet, Take 5 daily for 2 days followed by 4,3,2 and 1 for 2 days each., Disp: 30 tablet, Rfl: 0 .  rosuvastatin (CRESTOR) 40 MG tablet, TAKE (1/2) TABLET DAILY., Disp: 45 tablet, Rfl: 0 .  torsemide (DEMADEX) 20 MG tablet, Take 20 mg by mouth daily as needed (edema)., Disp: , Rfl:  .  Vitamin D, Ergocalciferol, (DRISDOL) 1.25 MG (50000 UT) CAPS capsule, Take 1 capsule (50,000 Units total) by mouth every 7 (seven) days., Disp: 8 capsule, Rfl: 0  Assessment/ Plan: 85 y.o. female   1. Depressive disorder Patient with ongoing depressive disorder that seems to be exacerbated by recent COVID-19 infection.  She wants to switch totally from her current medication.  I agree with coming off of the Paxil if possible given that it is on the beers list.  We will switch over to mirtazapine since this is a lower risk medication.  Because of her known atrial fibrillation and recent use of  azithromycin, will have her cut the Paxil in half for the next 7 days, wash out for 24 hours and then start the new medication at low dose given CKD of 7.5 mg nightly.  Teach back method was performed during this phone call and she was able to successfully tell me how to taper off of the medicine and onto the new medication.  She will follow-up with me in 4 weeks and a telephone visit has been scheduled.  If she starts having any withdrawal symptoms from the paroxetine, may need to consider starting back at 5 mg along with the Remeron.  Though would try to avoid this combination if possible given  the increased possibility of QTC prolongation in this patient with known arrhythmia. - mirtazapine (REMERON) 7.5 MG tablet; Take 1 tablet (7.5 mg total) by mouth at bedtime. (Taper off of Paxil first)  Dispense: 30 tablet; Refill: 0   Start time: 1:52pm End time: 2:10pm  Total time spent on patient care (including telephone call/ virtual visit): 22 minutes  [place], [place] [phone]

## 2018-12-28 ENCOUNTER — Emergency Department (HOSPITAL_COMMUNITY): Payer: Medicare Other

## 2018-12-28 ENCOUNTER — Inpatient Hospital Stay (HOSPITAL_COMMUNITY)
Admission: EM | Admit: 2018-12-28 | Discharge: 2018-12-31 | DRG: 177 | Disposition: A | Discharge status: Home / Self Care | Payer: Medicare Other | Attending: Internal Medicine | Admitting: Internal Medicine

## 2018-12-28 ENCOUNTER — Encounter (HOSPITAL_COMMUNITY): Payer: Self-pay | Admitting: Emergency Medicine

## 2018-12-28 ENCOUNTER — Other Ambulatory Visit: Payer: Self-pay

## 2018-12-28 DIAGNOSIS — Z823 Family history of stroke: Secondary | ICD-10-CM | POA: Diagnosis not present

## 2018-12-28 DIAGNOSIS — Z8049 Family history of malignant neoplasm of other genital organs: Secondary | ICD-10-CM

## 2018-12-28 DIAGNOSIS — R197 Diarrhea, unspecified: Secondary | ICD-10-CM | POA: Diagnosis not present

## 2018-12-28 DIAGNOSIS — I5022 Chronic systolic (congestive) heart failure: Secondary | ICD-10-CM | POA: Diagnosis present

## 2018-12-28 DIAGNOSIS — Z91041 Radiographic dye allergy status: Secondary | ICD-10-CM

## 2018-12-28 DIAGNOSIS — M549 Dorsalgia, unspecified: Secondary | ICD-10-CM | POA: Diagnosis present

## 2018-12-28 DIAGNOSIS — R072 Precordial pain: Secondary | ICD-10-CM

## 2018-12-28 DIAGNOSIS — E860 Dehydration: Secondary | ICD-10-CM | POA: Diagnosis present

## 2018-12-28 DIAGNOSIS — Z833 Family history of diabetes mellitus: Secondary | ICD-10-CM

## 2018-12-28 DIAGNOSIS — J9601 Acute respiratory failure with hypoxia: Secondary | ICD-10-CM | POA: Diagnosis not present

## 2018-12-28 DIAGNOSIS — T380X5A Adverse effect of glucocorticoids and synthetic analogues, initial encounter: Secondary | ICD-10-CM | POA: Diagnosis present

## 2018-12-28 DIAGNOSIS — I252 Old myocardial infarction: Secondary | ICD-10-CM

## 2018-12-28 DIAGNOSIS — K219 Gastro-esophageal reflux disease without esophagitis: Secondary | ICD-10-CM | POA: Diagnosis present

## 2018-12-28 DIAGNOSIS — H3530 Unspecified macular degeneration: Secondary | ICD-10-CM | POA: Diagnosis present

## 2018-12-28 DIAGNOSIS — M1990 Unspecified osteoarthritis, unspecified site: Secondary | ICD-10-CM | POA: Diagnosis present

## 2018-12-28 DIAGNOSIS — R55 Syncope and collapse: Secondary | ICD-10-CM | POA: Diagnosis not present

## 2018-12-28 DIAGNOSIS — K449 Diaphragmatic hernia without obstruction or gangrene: Secondary | ICD-10-CM | POA: Diagnosis present

## 2018-12-28 DIAGNOSIS — G8929 Other chronic pain: Secondary | ICD-10-CM | POA: Diagnosis present

## 2018-12-28 DIAGNOSIS — E785 Hyperlipidemia, unspecified: Secondary | ICD-10-CM | POA: Diagnosis present

## 2018-12-28 DIAGNOSIS — N1832 Chronic kidney disease, stage 3b: Secondary | ICD-10-CM | POA: Diagnosis not present

## 2018-12-28 DIAGNOSIS — E1169 Type 2 diabetes mellitus with other specified complication: Secondary | ICD-10-CM | POA: Diagnosis present

## 2018-12-28 DIAGNOSIS — U071 COVID-19: Secondary | ICD-10-CM | POA: Diagnosis not present

## 2018-12-28 DIAGNOSIS — E1165 Type 2 diabetes mellitus with hyperglycemia: Secondary | ICD-10-CM | POA: Diagnosis present

## 2018-12-28 DIAGNOSIS — Z955 Presence of coronary angioplasty implant and graft: Secondary | ICD-10-CM | POA: Diagnosis not present

## 2018-12-28 DIAGNOSIS — I130 Hypertensive heart and chronic kidney disease with heart failure and stage 1 through stage 4 chronic kidney disease, or unspecified chronic kidney disease: Secondary | ICD-10-CM | POA: Diagnosis present

## 2018-12-28 DIAGNOSIS — N184 Chronic kidney disease, stage 4 (severe): Secondary | ICD-10-CM | POA: Diagnosis present

## 2018-12-28 DIAGNOSIS — J1289 Other viral pneumonia: Secondary | ICD-10-CM | POA: Diagnosis not present

## 2018-12-28 DIAGNOSIS — Z86718 Personal history of other venous thrombosis and embolism: Secondary | ICD-10-CM | POA: Diagnosis not present

## 2018-12-28 DIAGNOSIS — E1122 Type 2 diabetes mellitus with diabetic chronic kidney disease: Secondary | ICD-10-CM | POA: Diagnosis present

## 2018-12-28 DIAGNOSIS — Z95810 Presence of automatic (implantable) cardiac defibrillator: Secondary | ICD-10-CM

## 2018-12-28 DIAGNOSIS — Z8674 Personal history of sudden cardiac arrest: Secondary | ICD-10-CM

## 2018-12-28 DIAGNOSIS — I2510 Atherosclerotic heart disease of native coronary artery without angina pectoris: Secondary | ICD-10-CM | POA: Diagnosis present

## 2018-12-28 DIAGNOSIS — R079 Chest pain, unspecified: Secondary | ICD-10-CM

## 2018-12-28 DIAGNOSIS — Z91040 Latex allergy status: Secondary | ICD-10-CM

## 2018-12-28 DIAGNOSIS — Z8349 Family history of other endocrine, nutritional and metabolic diseases: Secondary | ICD-10-CM

## 2018-12-28 DIAGNOSIS — Z7901 Long term (current) use of anticoagulants: Secondary | ICD-10-CM

## 2018-12-28 DIAGNOSIS — R0789 Other chest pain: Secondary | ICD-10-CM | POA: Diagnosis present

## 2018-12-28 DIAGNOSIS — Z8249 Family history of ischemic heart disease and other diseases of the circulatory system: Secondary | ICD-10-CM | POA: Diagnosis not present

## 2018-12-28 DIAGNOSIS — E876 Hypokalemia: Secondary | ICD-10-CM | POA: Diagnosis present

## 2018-12-28 DIAGNOSIS — I480 Paroxysmal atrial fibrillation: Secondary | ICD-10-CM | POA: Diagnosis present

## 2018-12-28 DIAGNOSIS — Z888 Allergy status to other drugs, medicaments and biological substances status: Secondary | ICD-10-CM

## 2018-12-28 DIAGNOSIS — I482 Chronic atrial fibrillation, unspecified: Secondary | ICD-10-CM | POA: Diagnosis present

## 2018-12-28 DIAGNOSIS — Z8262 Family history of osteoporosis: Secondary | ICD-10-CM

## 2018-12-28 DIAGNOSIS — J1282 Pneumonia due to coronavirus disease 2019: Secondary | ICD-10-CM

## 2018-12-28 DIAGNOSIS — Z8371 Family history of colonic polyps: Secondary | ICD-10-CM

## 2018-12-28 DIAGNOSIS — E7800 Pure hypercholesterolemia, unspecified: Secondary | ICD-10-CM | POA: Diagnosis present

## 2018-12-28 DIAGNOSIS — Z881 Allergy status to other antibiotic agents status: Secondary | ICD-10-CM

## 2018-12-28 DIAGNOSIS — Z91048 Other nonmedicinal substance allergy status: Secondary | ICD-10-CM

## 2018-12-28 DIAGNOSIS — Z825 Family history of asthma and other chronic lower respiratory diseases: Secondary | ICD-10-CM

## 2018-12-28 LAB — CBC
HCT: 42.8 % (ref 36.0–46.0)
Hemoglobin: 13.8 g/dL (ref 12.0–15.0)
MCH: 30.1 pg (ref 26.0–34.0)
MCHC: 32.2 g/dL (ref 30.0–36.0)
MCV: 93.2 fL (ref 80.0–100.0)
Platelets: 332 10*3/uL (ref 150–400)
RBC: 4.59 MIL/uL (ref 3.87–5.11)
RDW: 13.4 % (ref 11.5–15.5)
WBC: 10.3 10*3/uL (ref 4.0–10.5)
nRBC: 0 % (ref 0.0–0.2)

## 2018-12-28 LAB — COMPREHENSIVE METABOLIC PANEL
ALT: 33 U/L (ref 0–44)
AST: 41 U/L (ref 15–41)
Albumin: 3.6 g/dL (ref 3.5–5.0)
Alkaline Phosphatase: 56 U/L (ref 38–126)
Anion gap: 12 (ref 5–15)
BUN: 30 mg/dL — ABNORMAL HIGH (ref 8–23)
CO2: 21 mmol/L — ABNORMAL LOW (ref 22–32)
Calcium: 9.1 mg/dL (ref 8.9–10.3)
Chloride: 102 mmol/L (ref 98–111)
Creatinine, Ser: 1.52 mg/dL — ABNORMAL HIGH (ref 0.44–1.00)
GFR calc Af Amer: 36 mL/min — ABNORMAL LOW (ref >60)
GFR calc non Af Amer: 31 mL/min — ABNORMAL LOW (ref >60)
Glucose, Bld: 267 mg/dL — ABNORMAL HIGH (ref 70–99)
Potassium: 3.4 mmol/L — ABNORMAL LOW (ref 3.5–5.1)
Sodium: 135 mmol/L (ref 135–145)
Total Bilirubin: 0.5 mg/dL (ref 0.3–1.2)
Total Protein: 6.8 g/dL (ref 6.5–8.1)

## 2018-12-28 LAB — LACTIC ACID, PLASMA
Lactic Acid, Venous: 2.1 mmol/L (ref 0.5–1.9)
Lactic Acid, Venous: 1.5 mmol/L (ref 0.5–1.9)

## 2018-12-28 LAB — LACTATE DEHYDROGENASE: LDH: 163 U/L (ref 98–192)

## 2018-12-28 LAB — FIBRINOGEN: Fibrinogen: 494 mg/dL — ABNORMAL HIGH (ref 210–475)

## 2018-12-28 LAB — TROPONIN I (HIGH SENSITIVITY)
Troponin I (High Sensitivity): 12 ng/L (ref <18)
Troponin I (High Sensitivity): 13 ng/L (ref <18)

## 2018-12-28 LAB — PROCALCITONIN: Procalcitonin: <0.1 ng/mL

## 2018-12-28 LAB — C-REACTIVE PROTEIN: CRP: 1.3 mg/dL — ABNORMAL HIGH (ref <1.0)

## 2018-12-28 LAB — D-DIMER, QUANTITATIVE: D-Dimer, Quant: 1.04 ug/mL-FEU — ABNORMAL HIGH (ref 0.00–0.50)

## 2018-12-28 LAB — TRIGLYCERIDES: Triglycerides: 211 mg/dL — ABNORMAL HIGH (ref <150)

## 2018-12-28 LAB — FERRITIN: Ferritin: 220 ng/mL (ref 11–307)

## 2018-12-28 MED ORDER — SODIUM CHLORIDE 0.9 % IV BOLUS
500.0000 mL | Freq: Once | INTRAVENOUS | Status: AC
  Administered 2018-12-28: 500 mL via INTRAVENOUS

## 2018-12-28 MED ORDER — DEXAMETHASONE SODIUM PHOSPHATE 10 MG/ML IJ SOLN
6.0000 mg | INTRAMUSCULAR | Status: DC
  Administered 2018-12-28 – 2018-12-31 (×4): 6 mg via INTRAVENOUS
  Filled 2018-12-28 (×4): qty 1

## 2018-12-28 MED ORDER — PANTOPRAZOLE SODIUM 40 MG PO TBEC
40.0000 mg | DELAYED_RELEASE_TABLET | Freq: Every day | ORAL | Status: DC
  Administered 2018-12-29 – 2018-12-31 (×3): 40 mg via ORAL
  Filled 2018-12-28 (×3): qty 1

## 2018-12-28 MED ORDER — MORPHINE SULFATE (PF) 2 MG/ML IV SOLN
2.0000 mg | Freq: Once | INTRAVENOUS | Status: AC
  Administered 2018-12-28: 2 mg via INTRAVENOUS
  Filled 2018-12-28: qty 1

## 2018-12-28 MED ORDER — MIRTAZAPINE 15 MG PO TABS
7.5000 mg | ORAL_TABLET | Freq: Every day | ORAL | Status: DC
  Administered 2018-12-28 – 2018-12-30 (×3): 7.5 mg via ORAL
  Filled 2018-12-28 (×3): qty 1

## 2018-12-28 MED ORDER — SODIUM CHLORIDE 0.9% FLUSH: 3.0000 mL | Freq: Once | INTRAVENOUS | Status: DC

## 2018-12-28 MED ORDER — APIXABAN 2.5 MG PO TABS
2.5000 mg | ORAL_TABLET | Freq: Two times a day (BID) | ORAL | Status: DC
  Administered 2018-12-28 – 2018-12-31 (×6): 2.5 mg via ORAL
  Filled 2018-12-28 (×6): qty 1

## 2018-12-28 MED ORDER — ROSUVASTATIN CALCIUM 20 MG PO TABS
20.0000 mg | ORAL_TABLET | Freq: Every day | ORAL | Status: DC
  Administered 2018-12-29 – 2018-12-31 (×3): 20 mg via ORAL
  Filled 2018-12-28 (×3): qty 1

## 2018-12-28 MED ORDER — ENSURE ENLIVE PO LIQD
237.0000 mL | Freq: Two times a day (BID) | ORAL | Status: DC
  Administered 2018-12-29 – 2018-12-31 (×4): 237 mL via ORAL

## 2018-12-28 MED ORDER — METOPROLOL TARTRATE 25 MG PO TABS
25.0000 mg | ORAL_TABLET | Freq: Two times a day (BID) | ORAL | Status: DC
  Administered 2018-12-28 – 2018-12-31 (×6): 25 mg via ORAL
  Filled 2018-12-28 (×6): qty 1

## 2018-12-28 MED ORDER — DIPHENHYDRAMINE HCL 25 MG PO CAPS
25.0000 mg | ORAL_CAPSULE | Freq: Every evening | ORAL | Status: DC | PRN
  Administered 2018-12-28 – 2018-12-30 (×3): 25 mg via ORAL
  Filled 2018-12-28 (×3): qty 1

## 2018-12-28 MED ORDER — SODIUM CHLORIDE 0.9 % IV SOLN
200.0000 mg | Freq: Once | INTRAVENOUS | Status: AC
  Administered 2018-12-28: 200 mg via INTRAVENOUS
  Filled 2018-12-28: qty 40

## 2018-12-28 MED ORDER — POTASSIUM CHLORIDE IN NACL 20-0.9 MEQ/L-% IV SOLN
INTRAVENOUS | Status: DC
  Administered 2018-12-28: via INTRAVENOUS

## 2018-12-28 MED ORDER — SODIUM CHLORIDE 0.9 % IV SOLN
100.0000 mg | Freq: Every day | INTRAVENOUS | Status: DC
  Administered 2018-12-29 – 2018-12-31 (×3): 100 mg via INTRAVENOUS
  Filled 2018-12-28 (×2): qty 100
  Filled 2018-12-28 (×2): qty 20

## 2018-12-28 NOTE — H&P (Signed)
History and Physical    [name] [id number] DOB: July 24, 1931 DOA: 12/28/2018  PCP: [name], DO   Patient coming from: Home  I have personally briefly reviewed patient's old medical records in [place]  Chief Complaint: Chest Pain, difficulty breathing, diarrhea, known Covid positive.  HPI: [name] is a 85 y.o. female with medical history significant for atrial fibrillation on chronic anticoagulation, AICD status, hypertension, CKD 3, diabetes mellitus.  Presented to the ED with complaints of chest pain daily over the past week, lasting several hours, described as pressure-like, nonradiating, relieved by nitro. She reports generalized weakness and episodes of diarrhea over the past 2 days.  She reports dizziness feeling like she will pass out.  She reports associated difficulty breathing, and cough with body aches abdominal pain.  She recently tested positive for Covid 19. 12/3 when she was in the ED without complaints.  ED Course: O2 sats with greater than 86% on 2 L nasal cannula.  WBC 10.3.  Lactic acid 2.1 > 1.5.  Elevated inflammatory markers, procalcitonin less than 0.1.  EKG showed T wave changes in lead I and aVL.  Chest x-ray-new subtle basilar opacities may reflect atelectasis or sequelae of known Covid infection.  500 mill bolus given hospitalist admit for Covid pneumonia and chest pain.  Review of Systems: As per HPI all other systems reviewed and negative.  Past Medical History:  Diagnosis Date  . AICD (automatic cardioverter/defibrillator) present   . Allergy    SESONAL  . Anxiety   . ARTHRITIS   . Arthritis   . Atrial fibrillation ([place])   . CAD (coronary artery disease)    a. history of cardiac arrest 1993;  b. s/p LAD/LCX stenting in 2003;  c. 07/2011 Cath: LM nl, LAD patent stent, D1 80ost, LCX patent stent, RCA min irregs;  d. 04/2012 MV: EF 66%, no ishcemia;  e. 12/2013 Echo: Ef 55%, no rwma, Gr 1 DD, triv AI/MR, mildly dil LA;  f.  12/2013 Lexi MV: intermediate risk - apical ischemia and inf/infsept fixed defect, ? artifactual.  . CAD in native artery 12/31/2013   Previous stents to LAD and Ramus patent on cath today 12/31/13 also There is severe disease in the ostial first diagonal which is unchanged from most recent cardiac catheterization. The right coronary artery could not be engaged selectively but nonselective angiography showed no significant disease in the proximal and midsegment.     . Cataract    DENIES  . Chronic back pain   . Chronic sinus bradycardia   . CKD (chronic kidney disease), stage III   . Clotting disorder (HCC)    DVT  . COLITIS 12/02/2007   Qualifier: Diagnosis of  By: Nils Pyle CMA ([place]), [name]    . Diabetes mellitus without complication ([place])    DENIES  . Diverticulosis of colon (without mention of hemorrhage) 2007   Colonoscopy   . Esophageal stricture    a. 2012 s/p dil.  . Esophagitis, unspecified    a. 2012 EGD  . EXTERNAL HEMORRHOIDS   . GERD (gastroesophageal reflux disease)    omeprazole  . H/O hiatal hernia   . Hiatal hernia    a. 2012 EGD.  Marland Kitchen History of DVT in the past, not on Coumadin now    left  leg  . HYPERCHOLESTEROLEMIA   . Hypertension   . ICD (implantable cardiac defibrillator) in place    a. s/p initial ICD in 1993 in setting of cardiac arrest;  b. 01/2007 gen change: Guidant T135 Vitality DS VR single lead ICD.  . Macular degeneration    gets injecton in eye every 5 weeks- last injection - 05/03/2013   . Myocardial infarction ([place]) 1993  . Near syncope 05/22/2015  . Nephrolithiasis, just saw Dr [name]   . Orthostatic hypotension   . Peripheral vascular disease ([place])    ???  . RECTAL BLEEDING 12/03/2007   Qualifier: Diagnosis of  By: Sharlett Iles MD Byrd Hesselbach   . Unspecified gastritis and gastroduodenitis without mention of hemorrhage    a. 2003 EGD->not noted on 2012 EGD.  Marland Kitchen Unstable angina, neg MI, cath stable.maybe GI 12/30/2013    Past Surgical  History:  Procedure Laterality Date  . BREAST BIOPSY Left 11/2013  . CARDIAC CATHETERIZATION    . CARDIAC CATHETERIZATION  01/01/15   patent stents -there is severe disease in ostial 1st diag but without change.   Marland Kitchen CARDIAC DEFIBRILLATOR PLACEMENT  02/05/2007   Guidant  . CARDIOVASCULAR STRESS TEST  12/30/2013   abnormal  . COLONOSCOPY    . coronary stents     . CYSTOSCOPY WITH URETEROSCOPY Right 05/20/2013   Procedure: CYSTOSCOPY, RIGHT URETEROSCOPY STONE EXTRACTION, Insertion of right DOUBLE J STENT ;  Surgeon: Irine Seal, MD;  Location: [facility];  Service: Urology;  Laterality: Right;  . CYSTOSCOPY WITH URETEROSCOPY AND STENT PLACEMENT N/A 06/03/2013   Procedure: SECOND LOOK CYSTOSCOPY WITH URETEROSCOPY  HOLMIUM LASER LITHO AND STONE EXTRACTION Sammie Bench ;  Surgeon: Malka So, MD;  Location: [facility];  Service: Urology;  Laterality: N/A;  . GIVENS CAPSULE STUDY N/A 10/30/2016   Procedure: GIVENS CAPSULE STUDY;  Surgeon: Irene Shipper, MD;  Location: [facility];  Service: Endoscopy;  Laterality: N/A;  NEEDS TO BE ADMITTED FOR OBSERVATION PT. HAS DEFIB  . HEMORROIDECTOMY  80's  . HOLMIUM LASER APPLICATION Right [id number]   Procedure: HOLMIUM LASER APPLICATION;  Surgeon: Irine Seal, MD;  Location: [facility];  Service: Urology;  Laterality: Right;  . HYSTEROSCOPY W/D&C  09/02/2010   Procedure: DILATATION AND CURETTAGE (D&C) /HYSTEROSCOPY;  Surgeon: Margarette Asal;  Location: [facility];  Service: Gynecology;  Laterality: N/A;  Dilation and Curettage with Hysteroscopy and Polypectomy  . IMPLANTABLE CARDIOVERTER DEFIBRILLATOR (ICD) GENERATOR CHANGE N/A 02/10/2014   Procedure: ICD GENERATOR CHANGE;  Surgeon: Sanda Klein, MD;  Location: [facility];  Service: Cardiovascular;  Laterality: N/A;  . LEFT HEART CATHETERIZATION WITH CORONARY ANGIOGRAM N/A 07/28/2011   Procedure: LEFT HEART CATHETERIZATION WITH CORONARY ANGIOGRAM;  Surgeon: Lorretta Harp, MD;  Location: [facility] C[facility];  Service: Cardiovascular;   Laterality: N/A;  . LEFT HEART CATHETERIZATION WITH CORONARY ANGIOGRAM N/A 12/31/2013   Procedure: LEFT HEART CATHETERIZATION WITH CORONARY ANGIOGRAM;  Surgeon: Wellington Hampshire, MD;  Location: [facility];  Service: Cardiovascular;  Laterality: N/A;     reports that she has never smoked. She has never used smokeless tobacco. She reports that she does not drink alcohol or use drugs.  Allergies  Allergen Reactions  . Sotalol Other (See Comments)    Torsades   . Bactrim [Sulfamethoxazole-Trimethoprim]     Itchy rash  . Ciprofloxacin Nausea Only  . Actos [Pioglitazone] Swelling  . Adhesive [Tape] Other (See Comments)    Causes sores  . Contrast Media [Iodinated Diagnostic Agents] Other (See Comments)    Headache (no action/pre-med required)  . Latex Rash  . Lipitor [Atorvastatin] Other (See Comments)    myalgia    Family History  Problem  Relation Age of Onset  . Stroke Mother   . Other Mother        brain tumor  . Other Father        MI  . Heart attack Father   . Stroke Sister   . Macular degeneration Sister   . Diabetes Daughter   . Cancer Daughter        ovarian  . Colon polyps Daughter   . Atrial fibrillation Sister   . Hyperlipidemia Sister   . Osteoporosis Sister   . Stroke Sister   . Uterine cancer Sister   . Heart attack Brother   . Heart disease Brother   . Asthma Brother   . Breast cancer Neg Hx     Prior to Admission medications   Medication Sig Start Date End Date Taking? Authorizing Provider  Calcium Citrate-Vitamin D (CITRACAL + D PO) Take 1 tablet by mouth daily.   Yes [provider]  Cholecalciferol (VITAMIN D) 1000 UNITS capsule Take 2,000 Units by mouth daily.    Yes [provider]  diphenoxylate-atropine (LOMOTIL) 2.5-0.025 MG tablet Take 1 tablet by mouth every 8 (eight) hours as needed for diarrhea or loose stools. 05/16/18 05/16/19 Yes Chipper Herb, MD  ELIQUIS 2.5 MG TABS tablet TAKE  (1)  TABLET TWICE A DAY. Patient  taking differently: Take 2.5 mg by mouth 2 (two) times daily.  10/30/18  Yes Croitoru, Mihai, MD  metoprolol tartrate (LOPRESSOR) 25 MG tablet TAKE  (1)  TABLET TWICE A DAY. Patient taking differently: Take 25 mg by mouth 2 (two) times daily.  11/12/18  Yes Croitoru, Mihai, MD  mirtazapine (REMERON) 7.5 MG tablet Take 1 tablet (7.5 mg total) by mouth at bedtime. (Taper off of Paxil first) 12/26/18  Yes Gottschalk, Leatrice Jewels M, DO  Multiple Vitamins-Minerals (CENTRUM SILVER ULTRA WOMENS PO) Take 1 tablet by mouth daily.   Yes [provider]  pantoprazole (PROTONIX) 40 MG tablet Take 40 mg by mouth daily. 09/30/18  Yes [provider]  rosuvastatin (CRESTOR) 40 MG tablet TAKE (1/2) TABLET DAILY. Patient taking differently: Take 20 mg by mouth daily.  10/22/18  Yes Ronnie Doss M, DO  torsemide (DEMADEX) 20 MG tablet Take 20 mg by mouth daily as needed (edema).   Yes [provider]  Vitamin D, Ergocalciferol, (DRISDOL) 1.25 MG (50000 UT) CAPS capsule Take 1 capsule (50,000 Units total) by mouth every 7 (seven) days. 12/20/18  Yes Gottschalk, Leatrice Jewels M, DO  nitroGLYCERIN (NITROSTAT) 0.4 MG SL tablet Place 1 tablet (0.4 mg total) under the tongue every 5 (five) minutes as needed for chest pain. 12/20/18   Claretta Fraise, MD  predniSONE (DELTASONE) 10 MG tablet Take 5 daily for 2 days followed by 4,3,2 and 1 for 2 days each. 12/20/18   Claretta Fraise, MD  amLODipine (NORVASC) 2.5 MG tablet Take 1 tablet (2.5 mg total) by mouth daily. 03/04/18 08/28/18  Croitoru, Dani Gobble, MD    Physical Exam: Vitals:   12/28/18 1800 12/28/18 1830 12/28/18 1900 12/28/18 1930  BP: 121/82 124/77 135/82 129/84  Pulse: 76 86 91 78  Resp: 17 20 17 16   Temp:      TempSrc:      SpO2: 96% 96% 96% 96%  Weight:      Height:        Constitutional: NAD, calm, comfortable Vitals:   12/28/18 1800 12/28/18 1830 12/28/18 1900 12/28/18 1930  BP: 121/82 124/77 135/82 129/84  Pulse: 76 86 91 78  Resp: 17  20  17 16   Temp:      TempSrc:      SpO2: 96% 96% 96% 96%  Weight:      Height:       Eyes: PERRL, lids and conjunctivae normal ENMT: Mucous membranes are moist. Posterior pharynx clear of any exudate or lesions. Neck: normal, supple, no masses, no thyromegaly Respiratory: Normal respiratory effort. No accessory muscle use.  Cardiovascular: Regular rate and rhythm, no murmurs / rubs / gallops. No extremity edema. 2+ pedal pulses.   Abdomen: no tenderness, no masses palpated. No hepatosplenomegaly. Bowel sounds positive.  Musculoskeletal: no clubbing / cyanosis. No joint deformity upper and lower extremities. Good ROM, no contractures. Normal muscle tone.  Skin: no rashes, lesions, ulcers. No induration Neurologic: CN 2-12 grossly intact. . Strength 5/5 in all 4.  Psychiatric: Normal judgment and insight. Alert and oriented x 3. Normal mood.   Labs on Admission: I have personally reviewed following labs and imaging studies  CBC: Recent Labs  Lab 12/28/18 1601  WBC 10.3  HGB 13.8  HCT 42.8  MCV 93.2  PLT [id number]   Basic Metabolic Panel: Recent Labs  Lab 12/28/18 1601  NA 135  K 3.4*  CL 102  CO2 21*  GLUCOSE 267*  BUN 30*  CREATININE 1.52*  CALCIUM 9.1   Liver Function Tests: Recent Labs  Lab 12/28/18 1601  AST 41  ALT 33  ALKPHOS 56  BILITOT 0.5  PROT 6.8  ALBUMIN 3.6   Lipid Profile: Recent Labs    12/28/18 1601  TRIG 211*   Anemia Panel: Recent Labs    12/28/18 1601  FERRITIN 220   Urine analysis:    Component Value Date/Time   COLORURINE STRAW (A) 12/19/2018 2322   APPEARANCEUR CLEAR 12/19/2018 2322   APPEARANCEUR Clear 05/14/2018 1604   LABSPEC 1.003 (L) 12/19/2018 2322   PHURINE 5.0 12/19/2018 2322   GLUCOSEU NEGATIVE 12/19/2018 2322   HGBUR NEGATIVE 12/19/2018 2322   [place] NEGATIVE 12/19/2018 2322   BILIRUBINUR Negative 05/14/2018 [place] 12/19/2018 2322   PROTEINUR NEGATIVE 12/19/2018 2322   UROBILINOGEN negative  03/15/2015 1352   UROBILINOGEN 0.2 07/25/2011 2213   NITRITE NEGATIVE 12/19/2018 2322   LEUKOCYTESUR TRACE (A) 12/19/2018 2322    Radiological Exams on Admission: DG Chest Port 1 View  Result Date: 12/28/2018 CLINICAL DATA:  Chest pain EXAM: PORTABLE CHEST 1 VIEW COMPARISON:  08/28/2018 FINDINGS: Pacer defibrillator with power pack over left chest as before. Cardiomediastinal silhouette is stable with cardiac enlargement. No signs of edema, dense consolidation or evidence of pleural effusion. No acute bone finding. Subtle opacities at the lung bases are noted and have developed since the previous exam. IMPRESSION: New subtle basilar opacities may reflect atelectasis or sequela of known COVID infection. No dense consolidation or pleural effusion. Electronically Signed   By: Zetta Bills M.D.   On: 12/28/2018 15:51    EKG: Independently reviewed.  Atrial fibrillation 104, QTc 478.  T wave inversions in lead I and aVL, appears different compared to more recent EKG  Assessment/Plan Active Problems:   Pneumonia due to COVID-19 virus   COVID-19 pneumonia-reports dyspnea, O2 sats on nasal cannula greater than 95%, chest x-ray shows new subtle basilar opacities.  Elevated inflammatory markers.  Procalcitonin less than 0.1. -Start dexamethasone 6 mg daily -Remdesivir pharmacy to dose -COVID-19 admission protocol -Supplemental O2, albuterol inhaler as needed  Chest pain with history of coronary artery disease status post PCI to LAD and left circumflex  2003.  EKG showing T wave inversions in lead I and aVL.  Hs troponin 12 > 13.  Follows with Dr. Sallyanne Kuster.  Has had 3 subsequent cardiac cath, last cath-2015 no significant coronary stenosis, stents patent.  -I talked to cardiology on-call, Dr. Radford Pax.  She reviewed EKG, changes are secondary to repolarization abnormalities.  Recommends trend troponin, also patient on Eliquis so doubts PE considering elevated D-dimer. Ok to admit here at [facility] home metoprolol, Crestor, Eliquis.  Diarrhea with generalized abdominal pain, body aches and weakness.  Likely secondary to Covid.  Initial lactic acid 2.1 > 1.6, 500 mill bolus given in ED. -Gentle fluids for just 12 hrs as she is weak and appears dehydrated- N/s + 20 KCL 75 cc/hr  -Hold home torsemide  Atrial fibrillation-rate controlled and anticoagulated.  Currently atrial fibrillation -Resume home metoprolol and Eliquis.  CKD 3/4 -creatinine 1.52 at baseline. - Hold home torsemide for now.  AICD/pacemaker status-history of catecholamine allergic polymorphic ventricular tachycardia.  Follows with Dr. Sallyanne Kuster.  DVT prophylaxis: Eliquis Code Status: Full code Family Communication: None at bedside  disposition Plan: Per rounding team Consults called: None Admission status: Inpatient, telemetry I certify that at the point of admission it is my clinical judgment that the patient will require inpatient hospital care spanning beyond 2 midnights from the point of admission due to high intensity of service, high risk for further deterioration and high frequency of surveillance required. The following factors support the patient status of inpatient: COVID-19 pneumonia requiring intravenous antiviral agents or steroids.  Bethena Roys MD [facility]  12/28/2018, 9:32 PM

## 2018-12-28 NOTE — ED Provider Notes (Signed)
Emergency Department Provider Note   I have reviewed the triage vital signs and the nursing notes.   HISTORY  Chief Complaint Chest Pain   HPI [name] is a 85 y.o. female with PMH of CAD, A-fib on Eliquis, and ICD with COVID 19 diagnosis in the ED on 12/19/18 returns to the emergency department now with worsening weakness, chest pressure, near syncope, and worsening diarrhea.  Patient does not feel particularly short of breath but is having cough, headache, body aches.  She has been managing symptoms at home and felt like her Covid type symptoms from 12/3 when she was diagnosed have begun to improve slightly.  Over the past several days she developed worsening diarrhea with generalized abdominal discomfort.  She notes intermittent central chest pressure which was significantly worse today.  She feels very weak and like when she is up and walking around she may "pass out." Patient lives alone but has a family member nearby who checks on her from time to time.   Past Medical History:  Diagnosis Date  . AICD (automatic cardioverter/defibrillator) present   . Allergy    SESONAL  . Anxiety   . ARTHRITIS   . Arthritis   . Atrial fibrillation ([place])   . CAD (coronary artery disease)    a. history of cardiac arrest 1993;  b. s/p LAD/LCX stenting in 2003;  c. 07/2011 Cath: LM nl, LAD patent stent, D1 80ost, LCX patent stent, RCA min irregs;  d. 04/2012 MV: EF 66%, no ishcemia;  e. 12/2013 Echo: Ef 55%, no rwma, Gr 1 DD, triv AI/MR, mildly dil LA;  f. 12/2013 Lexi MV: intermediate risk - apical ischemia and inf/infsept fixed defect, ? artifactual.  . CAD in native artery 12/31/2013   Previous stents to LAD and Ramus patent on cath today 12/31/13 also There is severe disease in the ostial first diagonal which is unchanged from most recent cardiac catheterization. The right coronary artery could not be engaged selectively but nonselective angiography showed no significant disease in the  proximal and midsegment.     . Cataract    DENIES  . Chronic back pain   . Chronic sinus bradycardia   . CKD (chronic kidney disease), stage III   . Clotting disorder (HCC)    DVT  . COLITIS 12/02/2007   Qualifier: Diagnosis of  By: Nils Pyle CMA ([place]), [name]    . Diabetes mellitus without complication ([place])    DENIES  . Diverticulosis of colon (without mention of hemorrhage) 2007   Colonoscopy   . Esophageal stricture    a. 2012 s/p dil.  . Esophagitis, unspecified    a. 2012 EGD  . EXTERNAL HEMORRHOIDS   . GERD (gastroesophageal reflux disease)    omeprazole  . H/O hiatal hernia   . Hiatal hernia    a. 2012 EGD.  Marland Kitchen History of DVT in the past, not on Coumadin now    left  leg  . HYPERCHOLESTEROLEMIA   . Hypertension   . ICD (implantable cardiac defibrillator) in place    a. s/p initial ICD in 1993 in setting of cardiac arrest;  b. 01/2007 gen change: Guidant T135 Vitality DS VR single lead ICD.  . Macular degeneration    gets injecton in eye every 5 weeks- last injection - 05/03/2013   . Myocardial infarction ([place]) 1993  . Near syncope 05/22/2015  . Nephrolithiasis, just saw Dr [name]   . Orthostatic hypotension   . Peripheral vascular disease ([place])    ???  .  RECTAL BLEEDING 12/03/2007   Qualifier: Diagnosis of  By: Sharlett Iles MD Byrd Hesselbach   . Unspecified gastritis and gastroduodenitis without mention of hemorrhage    a. 2003 EGD->not noted on 2012 EGD.  Marland Kitchen Unstable angina, neg MI, cath stable.maybe GI 12/30/2013    Patient Active Problem List   Diagnosis Date Noted  . Pneumonia due to COVID-19 virus 12/28/2018  . Chronic diarrhea 09/18/2018  . Rectal pain 09/18/2018  . Osteoarthritis of right knee 03/29/2018  . Gastroesophageal reflux disease   . Hypertension associated with diabetes ([place]) 12/21/2017  . Degenerative scoliosis 02/19/2017  . Burst fracture of lumbar vertebra ([place]) 02/19/2017  . History of drug-induced prolonged QT interval with torsade de  pointes 11/01/2016  . Iron deficiency anemia 10/30/2016  . Chyann Ambrocio term current use of anticoagulant 02/17/2016  . Diabetes mellitus type 2, diet-controlled ([place]) 09/14/2014  . ICD (implantable cardioverter-defibrillator) battery depletion 01/20/2014  . Abnormal nuclear stress test, 12/30/13 12/31/2013  . Coronary artery disease involving native coronary artery of native heart without angina pectoris 12/31/2013  . Paroxysmal atrial fibrillation, chads2 Vasc2 score of 5, on eliquis 10/16/2013  . Catecholaminergic polymorphic ventricular tachycardia ([place]) 10/16/2013  . Ureteral stone with hydronephrosis 05/20/2013  . Metabolic syndrome [id number]  . Orthostatic hypotension 07/27/2011  . Chest pain, r/o cardiac, negative MI 07/25/2011  . Weakness 07/25/2011    Class: Acute  . Chronic sinus bradycardia 07/25/2011  . ICD (implantable cardioverter-defibrillator) in place 07/25/2011  . CKD (chronic kidney disease) stage 3, GFR 30-59 ml/min (HCC) 07/25/2011    Class: Chronic  . History of DVT in the past, on eliquis now 07/25/2011    Class: History of  . Nephrolithiasis, just saw Dr [name] 07/25/2011    Class: History of  . DYSPHAGIA 03/24/2010  . Chronic ischemic heart disease 12/03/2007  . Irritable bowel syndrome with diarrhea 12/03/2007  . EPIGASTRIC PAIN 12/03/2007  . Hyperlipidemia associated with type 2 diabetes mellitus ([place]) 12/02/2007    Class: History of  . EXTERNAL HEMORRHOIDS 12/02/2007    Class: History of  . ESOPHAGITIS 12/02/2007  . GASTRITIS 12/02/2007  . DIVERTICULOSIS, COLON 12/02/2007  . ARTHRITIS 12/02/2007    Class: Chronic    Past Surgical History:  Procedure Laterality Date  . BREAST BIOPSY Left 11/2013  . CARDIAC CATHETERIZATION    . CARDIAC CATHETERIZATION  01/01/15   patent stents -there is severe disease in ostial 1st diag but without change.   Marland Kitchen CARDIAC DEFIBRILLATOR PLACEMENT  02/05/2007   Guidant  . CARDIOVASCULAR STRESS TEST  12/30/2013    abnormal  . COLONOSCOPY    . coronary stents     . CYSTOSCOPY WITH URETEROSCOPY Right 05/20/2013   Procedure: CYSTOSCOPY, RIGHT URETEROSCOPY STONE EXTRACTION, Insertion of right DOUBLE J STENT ;  Surgeon: Irine Seal, MD;  Location: [facility];  Service: Urology;  Laterality: Right;  . CYSTOSCOPY WITH URETEROSCOPY AND STENT PLACEMENT N/A 06/03/2013   Procedure: SECOND LOOK CYSTOSCOPY WITH URETEROSCOPY  HOLMIUM LASER LITHO AND STONE EXTRACTION Sammie Bench ;  Surgeon: Malka So, MD;  Location: [facility];  Service: Urology;  Laterality: N/A;  . GIVENS CAPSULE STUDY N/A 10/30/2016   Procedure: GIVENS CAPSULE STUDY;  Surgeon: Irene Shipper, MD;  Location: [facility];  Service: Endoscopy;  Laterality: N/A;  NEEDS TO BE ADMITTED FOR OBSERVATION PT. HAS DEFIB  . HEMORROIDECTOMY  80's  . HOLMIUM LASER APPLICATION Right [id number]   Procedure: HOLMIUM LASER APPLICATION;  Surgeon: Irine Seal, MD;  Location: [facility];  Service: Urology;  Laterality: Right;  . HYSTEROSCOPY W/D&C  09/02/2010   Procedure: DILATATION AND CURETTAGE (D&C) /HYSTEROSCOPY;  Surgeon: Margarette Asal;  Location: [facility];  Service: Gynecology;  Laterality: N/A;  Dilation and Curettage with Hysteroscopy and Polypectomy  . IMPLANTABLE CARDIOVERTER DEFIBRILLATOR (ICD) GENERATOR CHANGE N/A 02/10/2014   Procedure: ICD GENERATOR CHANGE;  Surgeon: Sanda Klein, MD;  Location: [facility];  Service: Cardiovascular;  Laterality: N/A;  . LEFT HEART CATHETERIZATION WITH CORONARY ANGIOGRAM N/A 07/28/2011   Procedure: LEFT HEART CATHETERIZATION WITH CORONARY ANGIOGRAM;  Surgeon: Lorretta Harp, MD;  Location: [facility] C[facility];  Service: Cardiovascular;  Laterality: N/A;  . LEFT HEART CATHETERIZATION WITH CORONARY ANGIOGRAM N/A 12/31/2013   Procedure: LEFT HEART CATHETERIZATION WITH CORONARY ANGIOGRAM;  Surgeon: Wellington Hampshire, MD;  Location: [facility];  Service: Cardiovascular;  Laterality: N/A;    Allergies Sotalol, Bactrim [sulfamethoxazole-trimethoprim],  Ciprofloxacin, Actos [pioglitazone], Adhesive [tape], Contrast media [iodinated diagnostic agents], Latex, and Lipitor [atorvastatin]  Family History  Problem Relation Age of Onset  . Stroke Mother   . Other Mother        brain tumor  . Other Father        MI  . Heart attack Father   . Stroke Sister   . Macular degeneration Sister   . Diabetes Daughter   . Cancer Daughter        ovarian  . Colon polyps Daughter   . Atrial fibrillation Sister   . Hyperlipidemia Sister   . Osteoporosis Sister   . Stroke Sister   . Uterine cancer Sister   . Heart attack Brother   . Heart disease Brother   . Asthma Brother   . Breast cancer Neg Hx     Social History Social History   Tobacco Use  . Smoking status: Never Smoker  . Smokeless tobacco: Never Used  Substance Use Topics  . Alcohol use: No  . Drug use: No    Review of Systems  Constitutional: Positive fever, chills, and weakness.  Eyes: No visual changes. ENT: No sore throat. Cardiovascular: Positive chest pain. Respiratory: Denies shortness of breath. Gastrointestinal: Positive mild diffuse abdominal pain.  No nausea, no vomiting. Positive diarrhea.  No constipation. Genitourinary: Negative for dysuria. Musculoskeletal: Negative for back pain. Skin: Negative for rash. Neurological: Negative for headaches, focal weakness or numbness.  10-point ROS otherwise negative.  ____________________________________________   PHYSICAL EXAM:  VITAL SIGNS: ED Triage Vitals  Enc Vitals Group     BP 12/28/18 1517 (!) 138/97     Pulse Rate 12/28/18 1517 95     Resp 12/28/18 1517 (!) 24     Temp 12/28/18 1517 97.8 F (36.6 C)     Temp Source 12/28/18 1517 Oral     SpO2 12/28/18 1517 97 %     Weight 12/28/18 1525 140 lb (63.5 kg)     Height 12/28/18 1525 5\' 3"  (1.6 m)   Constitutional: Alert and oriented. Well appearing and in no acute distress. Eyes: Conjunctivae are normal.  Head: Atraumatic. Nose: No  congestion/rhinnorhea. Mouth/Throat: Mucous membranes are moist.  Neck: No stridor.   Cardiovascular: Normal rate, regular rhythm. Good peripheral circulation. Grossly normal heart sounds.   Respiratory: Normal respiratory effort.  No retractions. Lungs CTAB. Gastrointestinal: Soft with mild diffuse tenderness but nothing focal. No peritoneal signs. No distention.  Musculoskeletal:  No gross deformities of extremities. Neurologic:  Normal speech and language. No gross focal neurologic deficits are appreciated.  Skin:  Skin is  warm, dry and intact. No rash noted.  ____________________________________________   LABS (all labs ordered are listed, but only abnormal results are displayed)  Labs Reviewed  LACTIC ACID, PLASMA - Abnormal; Notable for the following components:      Result Value   Lactic Acid, Venous 2.1 (*)    All other components within normal limits  COMPREHENSIVE METABOLIC PANEL - Abnormal; Notable for the following components:   Potassium 3.4 (*)    CO2 21 (*)    Glucose, Bld 267 (*)    BUN 30 (*)    Creatinine, Ser 1.52 (*)    GFR calc non Af Amer 31 (*)    GFR calc Af Amer 36 (*)    All other components within normal limits  D-DIMER, QUANTITATIVE (NOT AT [facility]) - Abnormal; Notable for the following components:   D-Dimer, Quant 1.04 (*)    All other components within normal limits  TRIGLYCERIDES - Abnormal; Notable for the following components:   Triglycerides 211 (*)    All other components within normal limits  FIBRINOGEN - Abnormal; Notable for the following components:   Fibrinogen 494 (*)    All other components within normal limits  C-REACTIVE PROTEIN - Abnormal; Notable for the following components:   CRP 1.3 (*)    All other components within normal limits  CULTURE, BLOOD (ROUTINE X 2)  CULTURE, BLOOD (ROUTINE X 2)  CBC  LACTIC ACID, PLASMA  PROCALCITONIN  LACTATE DEHYDROGENASE  FERRITIN  TROPONIN I (HIGH SENSITIVITY)  TROPONIN I (HIGH SENSITIVITY)    ____________________________________________  EKG   EKG Interpretation  Date/Time:  Saturday December 28 2018 16:04:59 EST Ventricular Rate:  104 PR Interval:    QRS Duration: 124 QT Interval:  363 QTC Calculation: 478 R Axis:   -45 Text Interpretation: Atrial fibrillation Left bundle branch block No STEMI Confirmed by Nanda Quinton [phone]) on 12/28/2018 4:13:00 PM       ____________________________________________  RADIOLOGY  DG Chest Port 1 View  Result Date: 12/28/2018 CLINICAL DATA:  Chest pain EXAM: PORTABLE CHEST 1 VIEW COMPARISON:  08/28/2018 FINDINGS: Pacer defibrillator with power pack over left chest as before. Cardiomediastinal silhouette is stable with cardiac enlargement. No signs of edema, dense consolidation or evidence of pleural effusion. No acute bone finding. Subtle opacities at the lung bases are noted and have developed since the previous exam. IMPRESSION: New subtle basilar opacities may reflect atelectasis or sequela of known COVID infection. No dense consolidation or pleural effusion. Electronically Signed   By: Zetta Bills M.D.   On: 12/28/2018 15:51    ____________________________________________   PROCEDURES  Procedure(s) performed:   Procedures  CRITICAL CARE Performed by: Margette Fast Total critical care time: 35 minutes Critical care time was exclusive of separately billable procedures and treating other patients. Critical care was necessary to treat or prevent imminent or life-threatening deterioration. Critical care was time spent personally by me on the following activities: development of treatment plan with patient and/or surrogate as well as nursing, discussions with consultants, evaluation of patient's response to treatment, examination of patient, obtaining history from patient or surrogate, ordering and performing treatments and interventions, ordering and review of laboratory studies, ordering and review of radiographic studies,  pulse oximetry and re-evaluation of patient's condition.  Nanda Quinton, MD Emergency Medicine  ____________________________________________   INITIAL IMPRESSION / ASSESSMENT AND PLAN / ED COURSE  Pertinent labs & imaging results that were available during my care of the patient were reviewed by me and considered in  my medical decision making (see chart for details).   Patient with known COVID-19 infection presents with generalized weakness, near syncope, chest pain, worsening diarrhea.  She appears dehydrated clinically and I plan to start with a small IV fluid bolus while obtaining labs, blood cultures, lactate, and troponin. CXR ordered as well.   CXR with developing infiltrates bilaterally. Lactate elevated but doubt bacterial sepsis. CP improved but patient is high risk. She sits up for orthostatic vitals and become very tachycardic. No hypotension. Patient attempts to stand and nearly falls and complains of lightheadedness. Plan for IVF and obs admit. Doubt PE clinically so will defer imaging for now with CKD.   Discussed patient's case with [facility] to request admission. Patient and family (if present) updated with plan. Care transferred to [facility] service.  I reviewed all nursing notes, vitals, pertinent old records, EKGs, labs, imaging (as available).  ____________________________________________  FINAL CLINICAL IMPRESSION(S) / ED DIAGNOSES  Final diagnoses:  Precordial chest pain  Near syncope  Diarrhea of presumed infectious origin  COVID-19     MEDICATIONS GIVEN DURING THIS VISIT:  Medications  sodium chloride flush (NS) 0.9 % injection 3 mL (3 mLs Intravenous Not Given 12/28/18 1604)  dexamethasone (DECADRON) injection 6 mg (6 mg Intravenous Given 12/28/18 2134)  apixaban (ELIQUIS) tablet 2.5 mg (2.5 mg Oral Given 12/29/18 0858)  metoprolol tartrate (LOPRESSOR) tablet 25 mg (25 mg Oral Given 12/29/18 0858)  mirtazapine (REMERON) tablet 7.5 mg (7.5 mg Oral Given 12/28/18 2303)   pantoprazole (PROTONIX) EC tablet 40 mg (40 mg Oral Given 12/29/18 0859)  rosuvastatin (CRESTOR) tablet 20 mg (has no administration in time range)  remdesivir 200 mg in sodium chloride 0.9% 250 mL IVPB (200 mg Intravenous New Bag/Given 12/28/18 2200)    Followed by  remdesivir 100 mg in sodium chloride 0.9 % 100 mL IVPB (has no administration in time range)  feeding supplement (ENSURE ENLIVE) (ENSURE ENLIVE) liquid 237 mL (237 mLs Oral Given 12/29/18 0858)  diphenhydrAMINE (BENADRYL) capsule 25 mg (25 mg Oral Given 12/28/18 2313)  0.9 % NaCl with KCl 20 mEq/ L  infusion (has no administration in time range)  sodium chloride 0.9 % bolus 500 mL (0 mLs Intravenous Stopped 12/28/18 1649)  morphine 2 MG/ML injection 2 mg (2 mg Intravenous Given 12/28/18 1649)     Note:  This document was prepared using Dragon voice recognition software and may include unintentional dictation errors.  Nanda Quinton, MD, [facility] Emergency Medicine    Maeryn Mcgath, [facility], MD 12/29/18 [phone]

## 2018-12-28 NOTE — ED Notes (Signed)
Dr Emokpae at bedside.  

## 2018-12-28 NOTE — ED Notes (Signed)
ED TO INPATIENT HANDOFF REPORT  ED Nurse Name and Phone #: [phone]  S Name/Age/Gender [name] 85 y.o. female Room/Bed: APFT21/APFT21  Code Status   Code Status: Prior  Home/SNF/Other Home Patient oriented to: self, place, time and situation Is this baseline? Yes   Triage Complete: Triage complete  Chief Complaint Pneumonia due to COVID-19 virus [U07.1, J12.89]  Triage Note Patient came to ED via EMS from home. Alert and oriented. Airway patent. Patient c/o intermittent chest heaviness, non radiating. Hx of afib and defibrillator. Patient states shortness of breath, generalized weakness, and dizziness. Patient diagnosed with Covid a week ago. Patient has increased diarrhea x2 days. Per paramedic patient is orthostatic positive.     Allergies Allergies  Allergen Reactions  . Sotalol Other (See Comments)    Torsades   . Bactrim [Sulfamethoxazole-Trimethoprim]     Itchy rash  . Ciprofloxacin Nausea Only  . Actos [Pioglitazone] Swelling  . Adhesive [Tape] Other (See Comments)    Causes sores  . Contrast Media [Iodinated Diagnostic Agents] Other (See Comments)    Headache (no action/pre-med required)  . Latex Rash  . Lipitor [Atorvastatin] Other (See Comments)    myalgia    Level of Care/Admitting Diagnosis ED Disposition    ED Disposition Condition [facility] Area: [facility] [id number]  Level of Care: Telemetry [5]  Covid Evaluation: Confirmed COVID Positive  Diagnosis: Pneumonia due to COVID-19 virus [id number]  Admitting Physician: Bethena Roys [id number]  Attending Physician: Bethena Roys [phone]  Estimated length of stay: past midnight tomorrow  Certification:: I certify this patient will need inpatient services for at least 2 midnights       B Medical/Surgery History Past Medical History:  Diagnosis Date  . AICD (automatic cardioverter/defibrillator) present   . Allergy    SESONAL  . Anxiety   .  ARTHRITIS   . Arthritis   . Atrial fibrillation ([place])   . CAD (coronary artery disease)    a. history of cardiac arrest 1993;  b. s/p LAD/LCX stenting in 2003;  c. 07/2011 Cath: LM nl, LAD patent stent, D1 80ost, LCX patent stent, RCA min irregs;  d. 04/2012 MV: EF 66%, no ishcemia;  e. 12/2013 Echo: Ef 55%, no rwma, Gr 1 DD, triv AI/MR, mildly dil LA;  f. 12/2013 Lexi MV: intermediate risk - apical ischemia and inf/infsept fixed defect, ? artifactual.  . CAD in native artery 12/31/2013   Previous stents to LAD and Ramus patent on cath today 12/31/13 also There is severe disease in the ostial first diagonal which is unchanged from most recent cardiac catheterization. The right coronary artery could not be engaged selectively but nonselective angiography showed no significant disease in the proximal and midsegment.     . Cataract    DENIES  . Chronic back pain   . Chronic sinus bradycardia   . CKD (chronic kidney disease), stage III   . Clotting disorder (HCC)    DVT  . COLITIS 12/02/2007   Qualifier: Diagnosis of  By: Nils Pyle CMA ([place]), [name]    . Diabetes mellitus without complication ([place])    DENIES  . Diverticulosis of colon (without mention of hemorrhage) 2007   Colonoscopy   . Esophageal stricture    a. 2012 s/p dil.  . Esophagitis, unspecified    a. 2012 EGD  . EXTERNAL HEMORRHOIDS   . GERD (gastroesophageal reflux disease)    omeprazole  . H/O hiatal hernia   . Hiatal hernia  a. 2012 EGD.  Marland Kitchen History of DVT in the past, not on Coumadin now    left  leg  . HYPERCHOLESTEROLEMIA   . Hypertension   . ICD (implantable cardiac defibrillator) in place    a. s/p initial ICD in 1993 in setting of cardiac arrest;  b. 01/2007 gen change: Guidant T135 Vitality DS VR single lead ICD.  . Macular degeneration    gets injecton in eye every 5 weeks- last injection - 05/03/2013   . Myocardial infarction ([place]) 1993  . Near syncope 05/22/2015  . Nephrolithiasis, just saw Dr [name]   .  Orthostatic hypotension   . Peripheral vascular disease ([place])    ???  . RECTAL BLEEDING 12/03/2007   Qualifier: Diagnosis of  By: Sharlett Iles MD Byrd Hesselbach   . Unspecified gastritis and gastroduodenitis without mention of hemorrhage    a. 2003 EGD->not noted on 2012 EGD.  Marland Kitchen Unstable angina, neg MI, cath stable.maybe GI 12/30/2013   Past Surgical History:  Procedure Laterality Date  . BREAST BIOPSY Left 11/2013  . CARDIAC CATHETERIZATION    . CARDIAC CATHETERIZATION  01/01/15   patent stents -there is severe disease in ostial 1st diag but without change.   Marland Kitchen CARDIAC DEFIBRILLATOR PLACEMENT  02/05/2007   Guidant  . CARDIOVASCULAR STRESS TEST  12/30/2013   abnormal  . COLONOSCOPY    . coronary stents     . CYSTOSCOPY WITH URETEROSCOPY Right 05/20/2013   Procedure: CYSTOSCOPY, RIGHT URETEROSCOPY STONE EXTRACTION, Insertion of right DOUBLE J STENT ;  Surgeon: Irine Seal, MD;  Location: [facility];  Service: Urology;  Laterality: Right;  . CYSTOSCOPY WITH URETEROSCOPY AND STENT PLACEMENT N/A 06/03/2013   Procedure: SECOND LOOK CYSTOSCOPY WITH URETEROSCOPY  HOLMIUM LASER LITHO AND STONE EXTRACTION Sammie Bench ;  Surgeon: Malka So, MD;  Location: [facility];  Service: Urology;  Laterality: N/A;  . GIVENS CAPSULE STUDY N/A 10/30/2016   Procedure: GIVENS CAPSULE STUDY;  Surgeon: Irene Shipper, MD;  Location: [facility];  Service: Endoscopy;  Laterality: N/A;  NEEDS TO BE ADMITTED FOR OBSERVATION PT. HAS DEFIB  . HEMORROIDECTOMY  80's  . HOLMIUM LASER APPLICATION Right [id number]   Procedure: HOLMIUM LASER APPLICATION;  Surgeon: Irine Seal, MD;  Location: [facility];  Service: Urology;  Laterality: Right;  . HYSTEROSCOPY W/D&C  09/02/2010   Procedure: DILATATION AND CURETTAGE (D&C) /HYSTEROSCOPY;  Surgeon: Margarette Asal;  Location: [facility];  Service: Gynecology;  Laterality: N/A;  Dilation and Curettage with Hysteroscopy and Polypectomy  . IMPLANTABLE CARDIOVERTER DEFIBRILLATOR (ICD) GENERATOR CHANGE N/A  02/10/2014   Procedure: ICD GENERATOR CHANGE;  Surgeon: Sanda Klein, MD;  Location: [facility];  Service: Cardiovascular;  Laterality: N/A;  . LEFT HEART CATHETERIZATION WITH CORONARY ANGIOGRAM N/A 07/28/2011   Procedure: LEFT HEART CATHETERIZATION WITH CORONARY ANGIOGRAM;  Surgeon: Lorretta Harp, MD;  Location: [facility];  Service: Cardiovascular;  Laterality: N/A;  . LEFT HEART CATHETERIZATION WITH CORONARY ANGIOGRAM N/A 12/31/2013   Procedure: LEFT HEART CATHETERIZATION WITH CORONARY ANGIOGRAM;  Surgeon: Wellington Hampshire, MD;  Location: [facility];  Service: Cardiovascular;  Laterality: N/A;     A IV Location/Drains/Wounds Patient Lines/Drains/Airways Status   Active Line/Drains/Airways    Name:   Placement date:   Placement time:   Site:   Days:   Peripheral IV 12/28/18 Right Antecubital   12/28/18    1553    Antecubital   less than 1   Peripheral IV 12/28/18 Right Hand  12/28/18    1557    Hand   less than 1   Ureteral Drain/Stent Right ureter 6 Fr.   06/03/13    1132    Right ureter   2034   Incision 09/02/10 Perineum   09/02/10    0813     3039   Incision (Closed) 06/03/13 Perineum   06/03/13    1052     2034          Intake/Output Last 24 hours  Intake/Output Summary (Last 24 hours) at 12/28/2018 1920 Last data filed at 12/28/2018 1649 Gross per 24 hour  Intake 500 ml  Output --  Net 500 ml    Labs/Imaging Results for orders placed or performed during the hospital encounter of 12/28/18 (from the past 48 hour(s))  CBC     Status: None   Collection Time: 12/28/18  4:01 PM  Result Value Ref Range   WBC 10.3 4.0 - 10.5 K/uL   RBC 4.59 3.87 - 5.11 MIL/uL   Hemoglobin 13.8 12.0 - 15.0 g/dL   HCT 42.8 36.0 - 46.0 %   MCV 93.2 80.0 - 100.0 fL   MCH 30.1 26.0 - 34.0 pg   MCHC 32.2 30.0 - 36.0 g/dL   RDW 13.4 11.5 - 15.5 %   Platelets 332 150 - 400 K/uL   nRBC 0.0 0.0 - 0.2 %    Comment: Performed at [facility], [address], [place], [place] [address]   Troponin I (High Sensitivity)     Status: None   Collection Time: 12/28/18  4:01 PM  Result Value Ref Range   Troponin I (High Sensitivity) 12 <18 ng/L    Comment: (NOTE) Elevated high sensitivity troponin I (hsTnI) values and significant  changes across serial measurements may suggest ACS but many other  chronic and acute conditions are known to elevate hsTnI results.  Refer to the "Links" section for chest pain algorithms and additional  guidance. Performed at [facility], [address] [place], [place] [address]   Lactic acid, plasma     Status: Abnormal   Collection Time: 12/28/18  4:01 PM  Result Value Ref Range   Lactic Acid, Venous 2.1 (HH) 0.5 - 1.9 mmol/L    Comment: CRITICAL RESULT CALLED TO, READ BACK BY AND VERIFIED WITH: Augustino Savastano RN @ [id number] ON FO:[id number] BY HENDERSON L. Performed at [facility], [address], [place], [place] [address]   Blood Culture (routine x 2)     Status: None (Preliminary result)   Collection Time: 12/28/18  4:01 PM   Specimen: Right Antecubital; Blood  Result Value Ref Range   Specimen Description      RIGHT ANTECUBITAL BOTTLES DRAWN AEROBIC AND ANAEROBIC   Special Requests      Blood Culture adequate volume Performed at [facility], [address], [place], [place] [address]    Culture PENDING    Report Status PENDING   Comprehensive metabolic panel     Status: Abnormal   Collection Time: 12/28/18  4:01 PM  Result Value Ref Range   Sodium 135 135 - 145 mmol/L   Potassium 3.4 (L) 3.5 - 5.1 mmol/L   Chloride 102 98 - 111 mmol/L   CO2 21 (L) 22 - 32 mmol/L   Glucose, Bld 267 (H) 70 - 99 mg/dL   BUN 30 (H) 8 - 23 mg/dL   Creatinine, Ser 1.52 (H) 0.44 - 1.00 mg/dL   Calcium 9.1 8.9 - 10.3 mg/dL   Total Protein  6.8 6.5 - 8.1 g/dL   Albumin 3.6 3.5 - 5.0 g/dL   AST 41 15 - 41 U/L   ALT 33 0 - 44 U/L   Alkaline Phosphatase 56 38 - 126 U/L   Total Bilirubin 0.5 0.3 - 1.2 mg/dL   GFR calc non Af Amer 31 (L) >60 mL/min   GFR calc Af Amer  36 (L) >60 mL/min   Anion gap 12 5 - 15    Comment: Performed at [facility], [address] [place], [place] [address]  D-dimer, quantitative     Status: Abnormal   Collection Time: 12/28/18  4:01 PM  Result Value Ref Range   D-Dimer, Quant 1.04 (H) 0.00 - 0.50 ug/mL-FEU    Comment: (NOTE) At the manufacturer cut-off of 0.50 ug/mL FEU, this assay has been documented to exclude PE with a sensitivity and negative predictive value of 97 to 99%.  At this time, this assay has not been approved by the FDA to exclude DVT/VTE. Results should be correlated with clinical presentation. Performed at [facility], [address] [place],  [address]   Procalcitonin     Status: None   Collection Time: 12/28/18  4:01 PM  Result Value Ref Range   Procalcitonin <0.10 ng/mL    Comment:        Interpretation: PCT (Procalcitonin) <= 0.5 ng/mL: Systemic infection (sepsis) is not likely. Local bacterial infection is possible. (NOTE)       Sepsis PCT Algorithm           Lower Respiratory Tract                                      Infection PCT Algorithm    ----------------------------     ----------------------------         PCT < 0.25 ng/mL                PCT < 0.10 ng/mL         Strongly encourage             Strongly discourage   discontinuation of antibiotics    initiation of antibiotics    ----------------------------     -----------------------------       PCT 0.25 - 0.50 ng/mL            PCT 0.10 - 0.25 ng/mL               OR       >80% decrease in PCT            Discourage initiation of                                            antibiotics      Encourage discontinuation           of antibiotics    ----------------------------     -----------------------------         PCT >= 0.50 ng/mL              PCT 0.26 - 0.50 ng/mL               AND        <80% decrease in PCT             Encourage initiation  of                                             antibiotics       Encourage  continuation           of antibiotics    ----------------------------     -----------------------------        PCT >= 0.50 ng/mL                  PCT > 0.50 ng/mL               AND         increase in PCT                  Strongly encourage                                      initiation of antibiotics    Strongly encourage escalation           of antibiotics                                     -----------------------------                                           PCT <= 0.25 ng/mL                                                 OR                                        > 80% decrease in PCT                                     Discontinue / Do not initiate                                             antibiotics Performed at [facility], [address], [place], [place] [address]   Lactate dehydrogenase     Status: None   Collection Time: 12/28/18  4:01 PM  Result Value Ref Range   LDH 163 98 - 192 U/L    Comment: Performed at [facility], [address] [place], [place] [address]  Ferritin     Status: None   Collection Time: 12/28/18  4:01 PM  Result Value Ref Range   Ferritin 220 11 - 307 ng/mL    Comment: Performed at [facility], [address], [place], [place] [address]  Triglycerides     Status: Abnormal   Collection Time: 12/28/18  4:01 PM  Result Value Ref Range   Triglycerides 211 (H) <  150 mg/dL    Comment: Performed at [facility], [address] [place], [place] [address]  Fibrinogen     Status: Abnormal   Collection Time: 12/28/18  4:01 PM  Result Value Ref Range   Fibrinogen 494 (H) 210 - 475 mg/dL    Comment: Performed at [facility], [address] [place], [place] [address]  C-reactive protein     Status: Abnormal   Collection Time: 12/28/18  4:01 PM  Result Value Ref Range   CRP 1.3 (H) <1.0 mg/dL    Comment: Performed at [facility], [address] [place], [place] [address]  Blood Culture (routine x 2)     Status: None (Preliminary result)    Collection Time: 12/28/18  4:04 PM   Specimen: BLOOD RIGHT HAND  Result Value Ref Range   Specimen Description      BLOOD RIGHT HAND BOTTLES DRAWN AEROBIC AND ANAEROBIC   Special Requests      Blood Culture adequate volume Performed at [facility], [address], [place], [place] [address]    Culture  Setup Time PENDING    Culture PENDING    Report Status PENDING   Lactic acid, plasma     Status: None   Collection Time: 12/28/18  5:34 PM  Result Value Ref Range   Lactic Acid, Venous 1.5 0.5 - 1.9 mmol/L    Comment: Performed at [facility], [address] [place], [place] [address]  Troponin I (High Sensitivity)     Status: None   Collection Time: 12/28/18  5:34 PM  Result Value Ref Range   Troponin I (High Sensitivity) 13 <18 ng/L    Comment: (NOTE) Elevated high sensitivity troponin I (hsTnI) values and significant  changes across serial measurements may suggest ACS but many other  chronic and acute conditions are known to elevate hsTnI results.  Refer to the "Links" section for chest pain algorithms and additional  guidance. Performed at [facility], [address], [place], [place] [address]    DG Chest Port 1 View  Result Date: 12/28/2018 CLINICAL DATA:  Chest pain EXAM: PORTABLE CHEST 1 VIEW COMPARISON:  08/28/2018 FINDINGS: Pacer defibrillator with power pack over left chest as before. Cardiomediastinal silhouette is stable with cardiac enlargement. No signs of edema, dense consolidation or evidence of pleural effusion. No acute bone finding. Subtle opacities at the lung bases are noted and have developed since the previous exam. IMPRESSION: New subtle basilar opacities may reflect atelectasis or sequela of known COVID infection. No dense consolidation or pleural effusion. Electronically Signed   By: Zetta Bills M.D.   On: 12/28/2018 15:51    Pending Labs Unresulted Labs (From admission, onward)   None      Vitals/Pain Today's Vitals   12/28/18 1730 12/28/18  1800 12/28/18 1830 12/28/18 1900  BP: 123/77 121/82 124/77 135/82  Pulse: 76 76 86 91  Resp: 18 17 20 17   Temp:      TempSrc:      SpO2: 96% 96% 96% 96%  Weight:      Height:      PainSc:        Isolation Precautions Airborne and Contact precautions  Medications Medications  sodium chloride flush (NS) 0.9 % injection 3 mL (3 mLs Intravenous Not Given 12/28/18 1604)  sodium chloride 0.9 % bolus 500 mL (0 mLs Intravenous Stopped 12/28/18 1649)  morphine 2 MG/ML injection 2 mg (2 mg Intravenous Given 12/28/18 1649)    Mobility walks High fall risk  Focused Assessments   R Recommendations: See Admitting Provider Note  Report given to:   Additional Notes:

## 2018-12-28 NOTE — ED Notes (Signed)
Date and time results received: 12/28/18 1635 (use smartphrase ".now" to insert current time)  Test: Lactic Acid Critical Value: 2.1  Name of Provider Notified: Dr Laverta Baltimore  Orders Received? Or Actions Taken?:NA

## 2018-12-28 NOTE — ED Notes (Signed)
Pt unable to stand with orthostatics. Pt weak.

## 2018-12-28 NOTE — ED Triage Notes (Addendum)
Patient came to ED via EMS from home. Alert and oriented. Airway patent. Patient c/o intermittent chest heaviness, non radiating. Hx of afib and defibrillator. Patient states shortness of breath, generalized weakness, and dizziness. Patient diagnosed with Covid a week ago. Patient has increased diarrhea x2 days. Per paramedic patient is orthostatic positive.

## 2018-12-29 DIAGNOSIS — R197 Diarrhea, unspecified: Secondary | ICD-10-CM

## 2018-12-29 DIAGNOSIS — R072 Precordial pain: Secondary | ICD-10-CM

## 2018-12-29 DIAGNOSIS — J9601 Acute respiratory failure with hypoxia: Secondary | ICD-10-CM

## 2018-12-29 DIAGNOSIS — N1832 Chronic kidney disease, stage 3b: Secondary | ICD-10-CM

## 2018-12-29 MED ORDER — POTASSIUM CHLORIDE IN NACL 20-0.9 MEQ/L-% IV SOLN
INTRAVENOUS | Status: AC
  Administered 2018-12-29: 14:00:00 via INTRAVENOUS

## 2018-12-29 NOTE — Progress Notes (Signed)
PROGRESS NOTE    [name]  [id number] DOB: 06-04-1931 DOA: 12/28/2018 PCP: [name], DO     Brief Narrative:  85 y.o. female with medical history significant for atrial fibrillation on chronic anticoagulation, AICD status, hypertension, CKD 3, diabetes mellitus.  Presented to the ED with complaints of chest pain daily over the past week, lasting several hours, described as pressure-like, nonradiating, relieved by nitro. She reports generalized weakness and episodes of diarrhea over the past 2 days.  She reports dizziness feeling like she will pass out.  She reports associated difficulty breathing, and cough with body aches abdominal pain.  She recently tested positive for Covid 19. 12/3 when she was in the ED without complaints.  ED Course: O2 sats with greater than 86% on 2 L nasal cannula.  WBC 10.3. Lactic acid 2.1 > 1.5.  Elevated inflammatory markers, procalcitonin less than 0.1.  EKG showed T wave changes in lead I and aVL.  Chest x-ray-new subtle basilar opacities may reflect atelectasis or sequelae of known Covid infection.  500 mill bolus given hospitalist admit for Covid pneumonia and chest pain.   Assessment & Plan: 1-acute resp failure with hypoxia due to COVID-19 -still SOB and requiring 2L [place] -reports no CP -continue steroids, remdesivir and supportive care -follow clinical response. -continue IVF's, zinc and Vit C -PRN bronchodilators.  2-CP with hx of CAD -atypical and most likely associated with PNA process -patient reported increase couching spells. -troponin neg -case discussed with cardiology at time of admission and felt changes on EKG due to repolarization  -continue eliquis, b-blocker and statins  3-diarrhea -with dehydration and mild hypokalemia -continue IVF's and electrolyte repletion. -GI symptoms associated with COVID-19  4-chronic atrial fibrillation -stable -conitnue metoprolol -continue eliquis  5-chronic renal failure:  stage 3b-moving into stage 4 -continue IVF's -hold/minimize nephrotoxic agents  6-chronic systolic HF -stable and compensated -follow daily weights -strict I's and O's and low sodium diet -holding diuretics currently -AICD in place   DVT prophylaxis: Eliquis Code Status: Full Code Family Communication: no family at bedside Disposition Plan: continue to be inpatient, continue steroids, remdesivir, IVF's and supportive care. Wean off O2 supplementation as tolerated.  Consultants:   none  Procedures:   See below for x-ray reports.  Antimicrobials:  Anti-infectives (From admission, onward)   Start     Dose/Rate Route Frequency Ordered Stop   12/29/18 1000  remdesivir 100 mg in sodium chloride 0.9 % 100 mL IVPB     100 mg 200 mL/hr over 30 Minutes Intravenous Daily 12/28/18 2135 01/02/19 0959   12/28/18 2200  remdesivir 200 mg in sodium chloride 0.9% 250 mL IVPB     200 mg 580 mL/hr over 30 Minutes Intravenous Once 12/28/18 2135 12/28/18 2230      Subjective: Afebrile, no chest pain, no nausea or vomiting.  Still complaining of shortness of breath and requiring 2L [place] oxygen supplementation.  Objective: Vitals:   12/28/18 2245 12/29/18 0420 12/29/18 0503 12/29/18 1443  BP: (!) 144/90  128/89 128/85  Pulse: 89  75 77  Resp: 16  18 16   Temp: 98.7 F (37.1 C)  98.8 F (37.1 C) 98.4 F (36.9 C)  TempSrc: Oral   Oral  SpO2: 97%  97% 93%  Weight: 62.8 kg 62.8 kg    Height: 5\' 3"  (1.6 m)       Intake/Output Summary (Last 24 hours) at 12/29/2018 1708 Last data filed at 12/29/2018 1500 Gross per 24 hour  Intake 1000 ml  Output --  Net 1000 ml   Filed Weights   12/28/18 2241 12/28/18 2245 12/29/18 0420  Weight: 62.3 kg 62.8 kg 62.8 kg    Examination: General exam: Alert, awake, oriented x 3, feeling slightly better.  Still short of breath requiring oxygen supplementation.  No nausea, no vomiting. Respiratory system: No wheezing, no crackles; positive rhonchi.  No  using accessory muscles.   Cardiovascular system:Rate controlled. No rubs or gallops. Gastrointestinal system: Abdomen is nondistended, soft and nontender. No organomegaly or masses felt. Normal bowel sounds heard. Central nervous system: Alert and oriented. No focal neurological deficits. Extremities: No Cyanosis, no clubbing.  Skin: No rashes, no petechiae.  Psychiatry: Mood & affect appropriate.    Data Reviewed: I have personally reviewed following labs and imaging studies  CBC: Recent Labs  Lab 12/28/18 1601  WBC 10.3  HGB 13.8  HCT 42.8  MCV 93.2  PLT [id number]   Basic Metabolic Panel: Recent Labs  Lab 12/28/18 1601  NA 135  K 3.4*  CL 102  CO2 21*  GLUCOSE 267*  BUN 30*  CREATININE 1.52*  CALCIUM 9.1   GFR: Estimated Creatinine Clearance: 22.4 mL/min (A) (by C-G formula based on SCr of 1.52 mg/dL (H)).   Liver Function Tests: Recent Labs  Lab 12/28/18 1601  AST 41  ALT 33  ALKPHOS 56  BILITOT 0.5  PROT 6.8  ALBUMIN 3.6   Lipid Profile: Recent Labs    12/28/18 1601  TRIG 211*   Anemia Panel: Recent Labs    12/28/18 1601  FERRITIN 220   Urine analysis:    Component Value Date/Time   COLORURINE STRAW (A) 12/19/2018 2322   APPEARANCEUR CLEAR 12/19/2018 2322   APPEARANCEUR Clear 05/14/2018 1604   LABSPEC 1.003 (L) 12/19/2018 2322   PHURINE 5.0 12/19/2018 2322   GLUCOSEU NEGATIVE 12/19/2018 2322   HGBUR NEGATIVE 12/19/2018 2322   BILIRUBINUR NEGATIVE 12/19/2018 2322   BILIRUBINUR Negative 05/14/2018 [place] 12/19/2018 2322   PROTEINUR NEGATIVE 12/19/2018 2322   UROBILINOGEN negative 03/15/2015 1352   UROBILINOGEN 0.2 07/25/2011 2213   NITRITE NEGATIVE 12/19/2018 2322   LEUKOCYTESUR TRACE (A) 12/19/2018 2322    Recent Results (from the past 240 hour(s))  SARS CORONAVIRUS 2 (TAT 6-24 HRS) Nasopharyngeal Nasopharyngeal Swab     Status: Abnormal   Collection Time: 12/19/18  8:34 PM   Specimen: Nasopharyngeal Swab  Result  Value Ref Range Status   SARS Coronavirus 2 POSITIVE (A) NEGATIVE Final    Comment: RESULT CALLED TO, READ BACK BY AND VERIFIED WITH: Governor Rooks RN 13:55 12/20/18 ([email or web address]) (NOTE) SARS-CoV-2 target nucleic acids are DETECTED. The SARS-CoV-2 RNA is generally detectable in upper and lower respiratory specimens during the acute phase of infection. Positive results are indicative of the presence of SARS-CoV-2 RNA. Clinical correlation with patient history and other diagnostic information is  necessary to determine patient infection status. Positive results do not rule out bacterial infection or co-infection with other viruses.  The expected result is Negative. Fact Sheet for Patients: SugarRoll.be Fact Sheet for Healthcare Providers: https://www.woods-mathews.com/ This test is not yet approved or cleared by the [place] FDA and  has been authorized for detection and/or diagnosis of SARS-CoV-2 by FDA under an Emergency Use Authorization (EUA). This EUA will remain  in effect (meaning this test can be used) for th e duration of the COVID-19 declaration under Section 564(b)(1) of the Act, 21 U.S.C. section 360bbb-3(b)(1), unless the authorization is terminated or revoked sooner. Performed at  [facility], [place] [address] [place], [place] [address]   Blood Culture (routine x 2)     Status: None (Preliminary result)   Collection Time: 12/28/18  4:01 PM   Specimen: Right Antecubital; Blood  Result Value Ref Range Status   Specimen Description   Final    RIGHT ANTECUBITAL BOTTLES DRAWN AEROBIC AND ANAEROBIC   Special Requests Blood Culture adequate volume  Final   Culture   Final    NO GROWTH < 24 HOURS Performed at [facility], [address], [place], [place] [address]    Report Status PENDING  Incomplete  Blood Culture (routine x 2)     Status: None (Preliminary result)   Collection Time: 12/28/18  4:04 PM   Specimen: BLOOD RIGHT HAND    Result Value Ref Range Status   Specimen Description   Final    BLOOD RIGHT HAND BOTTLES DRAWN AEROBIC AND ANAEROBIC   Special Requests Blood Culture adequate volume  Final   Culture  Setup Time PENDING  Incomplete   Culture   Final    NO GROWTH < 24 HOURS Performed at [facility], [address], [place], [place] [address]    Report Status PENDING  Incomplete     Radiology Studies: DG Chest Port 1 View  Result Date: 12/28/2018 CLINICAL DATA:  Chest pain EXAM: PORTABLE CHEST 1 VIEW COMPARISON:  08/28/2018 FINDINGS: Pacer defibrillator with power pack over left chest as before. Cardiomediastinal silhouette is stable with cardiac enlargement. No signs of edema, dense consolidation or evidence of pleural effusion. No acute bone finding. Subtle opacities at the lung bases are noted and have developed since the previous exam. IMPRESSION: New subtle basilar opacities may reflect atelectasis or sequela of known COVID infection. No dense consolidation or pleural effusion. Electronically Signed   By: Zetta Bills M.D.   On: 12/28/2018 15:51    Scheduled Meds: . apixaban  2.5 mg Oral BID  . dexamethasone (DECADRON) injection  6 mg Intravenous Q24H  . feeding supplement (ENSURE ENLIVE)  237 mL Oral BID BM  . metoprolol tartrate  25 mg Oral BID  . mirtazapine  7.5 mg Oral QHS  . pantoprazole  40 mg Oral Daily  . rosuvastatin  20 mg Oral q1800  . sodium chloride flush  3 mL Intravenous Once   Continuous Infusions: . 0.9 % NaCl with KCl 20 mEq / L 75 mL/hr at 12/29/18 1332  . remdesivir 100 mg in NS 100 mL 100 mg (12/29/18 1029)     LOS: 1 day    Time spent: 35 minutes.   Barton Dubois, MD [facility] Pager [phone]   12/29/2018, 5:08 PM

## 2018-12-29 NOTE — Plan of Care (Signed)

## 2018-12-30 LAB — COMPREHENSIVE METABOLIC PANEL
ALT: 25 U/L (ref 0–44)
AST: 18 U/L (ref 15–41)
Albumin: 3.1 g/dL — ABNORMAL LOW (ref 3.5–5.0)
Alkaline Phosphatase: 48 U/L (ref 38–126)
Anion gap: 10 (ref 5–15)
BUN: 39 mg/dL — ABNORMAL HIGH (ref 8–23)
CO2: 18 mmol/L — ABNORMAL LOW (ref 22–32)
Calcium: 8.8 mg/dL — ABNORMAL LOW (ref 8.9–10.3)
Chloride: 108 mmol/L (ref 98–111)
Creatinine, Ser: 1.29 mg/dL — ABNORMAL HIGH (ref 0.44–1.00)
GFR calc Af Amer: 44 mL/min — ABNORMAL LOW (ref >60)
GFR calc non Af Amer: 38 mL/min — ABNORMAL LOW (ref >60)
Glucose, Bld: 387 mg/dL — ABNORMAL HIGH (ref 70–99)
Potassium: 4.3 mmol/L (ref 3.5–5.1)
Sodium: 136 mmol/L (ref 135–145)
Total Bilirubin: 0.8 mg/dL (ref 0.3–1.2)
Total Protein: 6.1 g/dL — ABNORMAL LOW (ref 6.5–8.1)

## 2018-12-30 LAB — MAGNESIUM: Magnesium: 1.9 mg/dL (ref 1.7–2.4)

## 2018-12-30 LAB — GLUCOSE, CAPILLARY
Glucose-Capillary: 339 mg/dL — ABNORMAL HIGH (ref 70–99)
Glucose-Capillary: 338 mg/dL — ABNORMAL HIGH (ref 70–99)

## 2018-12-30 LAB — C-REACTIVE PROTEIN: CRP: 0.7 mg/dL (ref <1.0)

## 2018-12-30 MED ORDER — SODIUM CHLORIDE 0.9 % IV SOLN
INTRAVENOUS | Status: AC
  Administered 2018-12-30: 17:00:00 via INTRAVENOUS

## 2018-12-30 MED ORDER — ADULT MULTIVITAMIN W/MINERALS CH
1.0000 | ORAL_TABLET | Freq: Every day | ORAL | Status: DC
  Administered 2018-12-30 – 2018-12-31 (×2): 1 via ORAL
  Filled 2018-12-30 (×2): qty 1

## 2018-12-30 MED ORDER — INSULIN DETEMIR 100 UNIT/ML ~~LOC~~ SOLN
0.0750 [IU]/kg | Freq: Two times a day (BID) | SUBCUTANEOUS | Status: DC
  Administered 2018-12-30 – 2018-12-31 (×2): 5 [IU] via SUBCUTANEOUS
  Filled 2018-12-30 (×4): qty 0.05

## 2018-12-30 MED ORDER — INSULIN ASPART 100 UNIT/ML ~~LOC~~ SOLN
0.0000 [IU] | SUBCUTANEOUS | Status: DC
  Administered 2018-12-30 (×2): 7 [IU] via SUBCUTANEOUS
  Administered 2018-12-31: 1 [IU] via SUBCUTANEOUS
  Administered 2018-12-31: 5 [IU] via SUBCUTANEOUS
  Administered 2018-12-31: 3 [IU] via SUBCUTANEOUS
  Administered 2018-12-31: 2 [IU] via SUBCUTANEOUS
  Administered 2018-12-31: 3 [IU] via SUBCUTANEOUS

## 2018-12-30 NOTE — Progress Notes (Signed)
PROGRESS NOTE    [name]  [id number] DOB: 09-17-1931 DOA: 12/28/2018 PCP: [name], DO     Brief Narrative:  84 y.o. female with medical history significant for atrial fibrillation on chronic anticoagulation, AICD status, hypertension, CKD 3, diabetes mellitus.  Presented to the ED with complaints of chest pain daily over the past week, lasting several hours, described as pressure-like, nonradiating, relieved by nitro. She reports generalized weakness and episodes of diarrhea over the past 2 days.  She reports dizziness feeling like she will pass out.  She reports associated difficulty breathing, and cough with body aches abdominal pain.  She recently tested positive for Covid 19. 12/3 when she was in the ED without complaints.  ED Course: O2 sats with greater than 86% on 2 L nasal cannula.  WBC 10.3. Lactic acid 2.1 > 1.5.  Elevated inflammatory markers, procalcitonin less than 0.1.  EKG showed T wave changes in lead I and aVL.  Chest x-ray-new subtle basilar opacities may reflect atelectasis or sequelae of known Covid infection.  500 mill bolus given hospitalist admit for Covid pneumonia and chest pain.   Assessment & Plan: 1-acute resp failure with hypoxia due to COVID-19 -still SOB and easily winded with exertion; not requiring O2 supplementation currently.  -reports no CP -continue steroids, remdesivir and supportive care -follow clinical response. -continue IVF's for another 15 hours -continue Zinc and Vit C -PRN bronchodilators.  2-CP with hx of CAD -atypical and most likely associated with PNA process -patient reported increase coughing spells. -troponin neg -no acute ischemic changes on EKG or telemetry. -case discussed with cardiology at time of admission and felt changes on EKG due to repolarization. -continue eliquis, b-blocker and statins -Will discontinue telemetry.  3-diarrhea -with dehydration and mild hypokalemia -continue IVF's and  electrolyte repletion. -GI symptoms associated with COVID-19  4-chronic atrial fibrillation -stable and rate controlled. -conitnue metoprolol and continue Eliquis for secondary prevention. -Safe to discontinue telemetry. -Patient denies palpitations.  5-chronic renal failure: stage 3b-moving into stage 4 -continue IVF's -Continue to hold/minimize nephrotoxic agents -Repeat complete metabolic panel in the morning to follow electrolytes and renal function  6-chronic systolic HF -stable and compensated -follow daily weights -strict I's and O's and low sodium diet -continue holding diuretics currently -AICD in place -safe to discontinue telemetry.   DVT prophylaxis: Eliquis Code Status: Full Code Family Communication: no family at bedside Disposition Plan: continue to be inpatient, continue steroids, remdesivir, IVF's and supportive care.  Continue to follow electrolytes and renal function. Start insulin treatment for elevated CBGs in the setting of steroids usage.  Consultants:   none  Procedures:   See below for x-ray reports.  Antimicrobials:  Anti-infectives (From admission, onward)   Start     Dose/Rate Route Frequency Ordered Stop   12/29/18 1000  remdesivir 100 mg in sodium chloride 0.9 % 100 mL IVPB     100 mg 200 mL/hr over 30 Minutes Intravenous Daily 12/28/18 2135 01/02/19 0959   12/28/18 2200  remdesivir 200 mg in sodium chloride 0.9% 250 mL IVPB     200 mg 580 mL/hr over 30 Minutes Intravenous Once 12/28/18 2135 12/28/18 2230      Subjective: No fever, no chest pain, no nausea, no vomiting.  Patient reports shortness of breath; no using accessory muscles, no requiring oxygen currently.  Still winded with minimal exertion.  Objective: Vitals:   12/29/18 0503 12/29/18 1443 12/29/18 2203 12/30/18 0445  BP: 128/89 128/85 (!) 146/83 130/88  Pulse: 75  77 81 77  Resp: 18 16 16 18   Temp: 98.8 F (37.1 C) 98.4 F (36.9 C) 98.3 F (36.8 C) 98.7 F (37.1 C)    TempSrc:  Oral Oral Oral  SpO2: 97% 93% 95% 95%  Weight:      Height:        Intake/Output Summary (Last 24 hours) at 12/30/2018 1550 Last data filed at 12/29/2018 1700 Gross per 24 hour  Intake 240 ml  Output --  Net 240 ml   Filed Weights   12/28/18 2241 12/28/18 2245 12/29/18 0420  Weight: 62.3 kg 62.8 kg 62.8 kg    Examination: General exam: Alert, awake, oriented x 3; patient reports feeling better; no chest pain, no nausea, no vomiting.  Still short of breath on exertion; but no requiring oxygen supplementation currently. Respiratory system: Positive rhonchi bilaterally; no using accessory muscles.  No wheezing, no crackles.  Normal respiratory effort.   Cardiovascular system: Rate controlled. No murmurs, rubs, gallops. Gastrointestinal system: Abdomen is nondistended, soft and nontender. No organomegaly or masses felt. Normal bowel sounds heard. Central nervous system: Alert and oriented. No focal neurological deficits. Extremities: No C/C/E, +pedal pulses Skin: No rashes, no petechiae. Psychiatry: Judgement and insight appear normal. Mood & affect appropriate.    Data Reviewed: I have personally reviewed following labs and imaging studies  CBC: Recent Labs  Lab 12/28/18 1601  WBC 10.3  HGB 13.8  HCT 42.8  MCV 93.2  PLT [id number]   Basic Metabolic Panel: Recent Labs  Lab 12/28/18 1601 12/30/18 0453  NA 135 136  K 3.4* 4.3  CL 102 108  CO2 21* 18*  GLUCOSE 267* 387*  BUN 30* 39*  CREATININE 1.52* 1.29*  CALCIUM 9.1 8.8*  MG  --  1.9   GFR: Estimated Creatinine Clearance: 26.4 mL/min (A) (by C-G formula based on SCr of 1.29 mg/dL (H)).   Liver Function Tests: Recent Labs  Lab 12/28/18 1601 12/30/18 0453  AST 41 18  ALT 33 25  ALKPHOS 56 48  BILITOT 0.5 0.8  PROT 6.8 6.1*  ALBUMIN 3.6 3.1*   Lipid Profile: Recent Labs    12/28/18 1601  TRIG 211*   Anemia Panel: Recent Labs    12/28/18 1601  FERRITIN 220   Urine analysis:     Component Value Date/Time   COLORURINE STRAW (A) 12/19/2018 2322   APPEARANCEUR CLEAR 12/19/2018 2322   APPEARANCEUR Clear 05/14/2018 1604   LABSPEC 1.003 (L) 12/19/2018 2322   PHURINE 5.0 12/19/2018 2322   GLUCOSEU NEGATIVE 12/19/2018 2322   HGBUR NEGATIVE 12/19/2018 2322   BILIRUBINUR NEGATIVE 12/19/2018 2322   BILIRUBINUR Negative 05/14/2018 [place] 12/19/2018 2322   PROTEINUR NEGATIVE 12/19/2018 2322   UROBILINOGEN negative 03/15/2015 1352   UROBILINOGEN 0.2 07/25/2011 2213   NITRITE NEGATIVE 12/19/2018 2322   LEUKOCYTESUR TRACE (A) 12/19/2018 2322    Recent Results (from the past 240 hour(s))  Blood Culture (routine x 2)     Status: None (Preliminary result)   Collection Time: 12/28/18  4:01 PM   Specimen: Right Antecubital; Blood  Result Value Ref Range Status   Specimen Description   Final    RIGHT ANTECUBITAL BOTTLES DRAWN AEROBIC AND ANAEROBIC   Special Requests Blood Culture adequate volume  Final   Culture   Final    NO GROWTH 2 DAYS Performed at [facility], [address], [place], [place] [address]    Report Status PENDING  Incomplete  Blood Culture (routine x 2)  Status: None (Preliminary result)   Collection Time: 12/28/18  4:04 PM   Specimen: BLOOD RIGHT HAND  Result Value Ref Range Status   Specimen Description   Final    BLOOD RIGHT HAND BOTTLES DRAWN AEROBIC AND ANAEROBIC   Special Requests Blood Culture adequate volume  Final   Culture  Setup Time PENDING  Incomplete   Culture   Final    NO GROWTH 2 DAYS Performed at [facility], [address], [place], [place] [address]    Report Status PENDING  Incomplete     Radiology Studies: No results found.  Scheduled Meds: . apixaban  2.5 mg Oral BID  . dexamethasone (DECADRON) injection  6 mg Intravenous Q24H  . feeding supplement (ENSURE ENLIVE)  237 mL Oral BID BM  . insulin aspart  0-9 Units Subcutaneous Q4H  . insulin detemir  0.075 Units/kg Subcutaneous BID  . metoprolol  tartrate  25 mg Oral BID  . mirtazapine  7.5 mg Oral QHS  . pantoprazole  40 mg Oral Daily  . rosuvastatin  20 mg Oral q1800  . sodium chloride flush  3 mL Intravenous Once   Continuous Infusions: . remdesivir 100 mg in NS 100 mL 100 mg (12/30/18 0937)     LOS: 2 days    Time spent: 35 minutes.   Barton Dubois, MD [facility] Pager [phone]   12/30/2018, 3:50 PM

## 2018-12-30 NOTE — Progress Notes (Addendum)
Initial Nutrition Assessment  DOCUMENTATION CODES:      INTERVENTION:  Liberalize diet to CHO modified  Ensure Enlive po BID, each supplement provides 350 kcal and 20 grams of protein   NUTRITION DIAGNOSIS:   Increased nutrient needs related to acute illness(COVID positive) as evidenced by estimated needs.   GOAL: Pt to meet >/= 90% of their estimated nutrition needs     MONITOR:  PO intake, Supplement acceptance, Labs, Weight trends, I & O's  REASON FOR ASSESSMENT:   Malnutrition Screening Tool    ASSESSMENT: Patient is an 85 yo female with hx of diet-controlled DM (A1C-7.0%),  HTN, GERD, CKD-3, Macular degeneration and esophageal stricture.     On 12/12 she presented COVID positive, Chest pain, short of breath and diarrhea.   Documented 50-65% of meal yesterday. No data for today. Multiple attempts to reach pt by telephone but unable due to busy signal. Increased risk for malnutrition due to [place]. She is taking remeron which may help improve her appetite.   Review of weights shows loss of 10 kg compared to December 2019 wt 72.6 kg. Usual wt had been staying in the range of 69-73 kg the first half of this year and then since June her weight has been steadily decreasing -down 13% for the year and significantly the past 3 months at (8%)- from October to December.  CBG (last 3)  No results for input(s): GLUCAP in the last 72 hours.   Labs reviewed:  BMP Latest Ref Rng & Units 12/30/2018 12/28/2018 12/19/2018  Glucose 70 - 99 mg/dL 387(H) 267(H) 138(H)  BUN 8 - 23 mg/dL 39(H) 30(H) 26(H)  Creatinine 0.44 - 1.00 mg/dL 1.29(H) 1.52(H) 1.70(H)  BUN/Creat Ratio 12 - 28 - - -  Sodium 135 - 145 mmol/L 136 135 135  Potassium 3.5 - 5.1 mmol/L 4.3 3.4(L) 3.8  Chloride 98 - 111 mmol/L 108 102 107  CO2 22 - 32 mmol/L 18(L) 21(L) 19(L)  Calcium 8.9 - 10.3 mg/dL 8.8(L) 9.1 8.5(L)    Medications reviewed and include: Remeron, Crestor, Protonix, Lopressor  I/O's- +1.74 liters    NUTRITION - FOCUSED PHYSICAL EXAM: Deferred due to COVID precautions   Diet Order:   Diet Order            Diet heart healthy/carb modified Room service appropriate? Yes; Fluid consistency: Thin  Diet effective now              EDUCATION NEEDS:   Not appropriate for education at this time Skin:  Skin Assessment: Reviewed RN Assessment  Last BM:  12/13  Height:   Ht Readings from Last 1 Encounters:  12/28/18 5\' 3"  (1.6 m)    Weight:   Wt Readings from Last 1 Encounters:  12/29/18 62.8 kg    Ideal Body Weight:  52 kg  BMI:  Body mass index is 24.53 kg/m.  Estimated Nutritional Needs:   Kcal:  [id number]  Protein:  75-80 gr  Fluid:  >1500 ml daily   Colman Cater MS,RD,CSG,LDN Office: [phone] Pager: [phone]

## 2018-12-30 NOTE — Progress Notes (Signed)
Inpatient Diabetes Program Recommendations  AACE/ADA: New Consensus Statement on Inpatient Glycemic Control (2015)  Target Ranges:  Prepandial:   less than 140 mg/dL      Peak postprandial:   less than 180 mg/dL (1-2 hours)      Critically ill patients:  140 - 180 mg/dL   Lab Results  Component Value Date   GLUCAP 173 (H) 02/07/2018   HGBA1C 7.0 (H) 10/23/2018    Review of Glycemic Control Results for LORAH, KESSELMAN (MRN [id number]) as of 12/30/2018 12:05  Ref. Range 12/28/2018 16:01 12/30/2018 04:53  Glucose Latest Ref Range: 70 - 99 mg/dL 267 (H) 387 (H)   Diabetes history: DM 2 Diet controlled  Current orders for Inpatient glycemic control: none  A1c 7% on 10/23/18 Decadron 6 mg Q 24 hours Ensure Enlive bid between meals BUN/Creat: 39/1.29  Inpatient Diabetes Program Recommendations:    Consider utilizing COVID Glycemic Control order set Initial SQ therapy Sensitive Correction  Sensitive setting on basal insulin (which will recommend Levemir 5 units bid)  Thanks,  Tama Headings RN, MSN, BC-ADM Inpatient Diabetes Coordinator Team Pager [phone] (8a-5p)

## 2018-12-30 NOTE — Progress Notes (Signed)
Patient resting in bed watching tv. Patient voices no complaints at this time.

## 2018-12-30 NOTE — Care Management Important Message (Signed)
Important Message  Patient Details  Name: [name] MRN: [id number] Date of Birth: 24-Aug-1931   Medicare Important Message Given:  Yes(given to Network engineer for RN to deliver to patient due to contact precautions)     Tommy Medal 12/30/2018, 3:21 PM

## 2018-12-31 DIAGNOSIS — R0789 Other chest pain: Secondary | ICD-10-CM

## 2018-12-31 DIAGNOSIS — R55 Syncope and collapse: Secondary | ICD-10-CM

## 2018-12-31 LAB — GLUCOSE, CAPILLARY
Glucose-Capillary: 146 mg/dL — ABNORMAL HIGH (ref 70–99)
Glucose-Capillary: 191 mg/dL — ABNORMAL HIGH (ref 70–99)
Glucose-Capillary: 218 mg/dL — ABNORMAL HIGH (ref 70–99)
Glucose-Capillary: 266 mg/dL — ABNORMAL HIGH (ref 70–99)

## 2018-12-31 LAB — COMPREHENSIVE METABOLIC PANEL
ALT: 19 U/L (ref 0–44)
AST: 18 U/L (ref 15–41)
Albumin: 2.9 g/dL — ABNORMAL LOW (ref 3.5–5.0)
Alkaline Phosphatase: 44 U/L (ref 38–126)
Anion gap: 8 (ref 5–15)
BUN: 43 mg/dL — ABNORMAL HIGH (ref 8–23)
CO2: 23 mmol/L (ref 22–32)
Calcium: 9 mg/dL (ref 8.9–10.3)
Chloride: 108 mmol/L (ref 98–111)
Creatinine, Ser: 1.33 mg/dL — ABNORMAL HIGH (ref 0.44–1.00)
GFR calc Af Amer: 42 mL/min — ABNORMAL LOW (ref >60)
GFR calc non Af Amer: 36 mL/min — ABNORMAL LOW (ref >60)
Glucose, Bld: 208 mg/dL — ABNORMAL HIGH (ref 70–99)
Potassium: 5.3 mmol/L — ABNORMAL HIGH (ref 3.5–5.1)
Sodium: 139 mmol/L (ref 135–145)
Total Bilirubin: 0.6 mg/dL (ref 0.3–1.2)
Total Protein: 5.8 g/dL — ABNORMAL LOW (ref 6.5–8.1)

## 2018-12-31 LAB — C-REACTIVE PROTEIN: CRP: 0.6 mg/dL (ref <1.0)

## 2018-12-31 MED ORDER — DEXAMETHASONE 6 MG PO TABS: 6.0000 mg | ORAL_TABLET | Freq: Every day | ORAL | 0 refills | Status: DC

## 2018-12-31 MED ORDER — NITROGLYCERIN 0.4 MG SL SUBL
0.4000 mg | SUBLINGUAL_TABLET | SUBLINGUAL | Status: DC | PRN
  Administered 2018-12-31 (×3): 0.4 mg via SUBLINGUAL
  Filled 2018-12-31: qty 1

## 2018-12-31 NOTE — Progress Notes (Signed)
**Note De-identified  Obfuscation** EKG complete RN notified  and placed in patient chart.  

## 2018-12-31 NOTE — Progress Notes (Signed)
Pt now complaining of center, anterior chest pain. Rates 7/10. Describes as dull pressure. States has been hurting all morning but worse now. Pt has not notified either me or the MD earlier that she was having any chest pain or pressure, pt states, "I thought it would just go away." Pt also states that this pain is similar to episodes she has at home when she feels like her heart is "out of rythmn". B/P 151/110, HR 68 and  Irregular. Pt states feels SOB but resp 16/min and SaO2 100% on RA. Skin warm and dry and pink. Dr. Dyann Kief notified via [facility] and chat. MD returned call and orders received for SL Ntg and EKG. Pt notified of orders received and agreeable. States, "I usually take a nitro at home when this happens." Ntg 0.5mg  SL given per order (b/p 161/90 prior to admin).

## 2018-12-31 NOTE — Discharge Summary (Signed)
Physician Discharge Summary  [name] [id number] DOB: 11/24/1931 DOA: 12/28/2018  PCP: [name], DO  Admit date: 12/28/2018 Discharge date: 12/31/2018  Time spent: 35 minutes  Recommendations for Outpatient Follow-up:  1. Repeat basic metabolic panel to follow electrolytes and renal function 2. Close monitoring of patient's CBGs with initiation of diabetes regimen if required; prior to admission was following modified carbohydrate diets only.  Discharge Diagnoses:  Active Problems:   Near syncope   Diarrhea of presumed infectious origin   Pneumonia due to COVID-19 virus Type 2 diabetes with nephropathy Chronic kidney disease a stage IIIb Chronic atrial fibrillation -Chronic systolic heart failure.  Discharge Condition: Stable and improved.  Patient discharged home with instruction to follow-up with PCP in 10 days.  Diet recommendation: Heart healthy/low-sodium and modify carbohydrates diet.  Filed Weights   12/28/18 2241 12/28/18 2245 12/29/18 0420  Weight: 62.3 kg 62.8 kg 62.8 kg    History of present illness:  85 y.o.femalewith medical history significant foratrial fibrillation on chronic anticoagulation, AICD status, hypertension, CKD 3, diabetes mellitus.Presented to the ED with complaints of chest pain daily over the past week,lasting several hours, described as pressure-like, nonradiating, relieved by nitro. She reports generalized weakness and episodes of diarrhea over the past 2 days. She reports dizziness feeling like she will pass out. She reports associated difficulty breathing, and cough with body aches abdominal pain. She recently tested positive for Covid 19. 12/3when she was in the ED without complaints.  ED Course:O2 sats with greater than 86% on 2 L nasal cannula. WBC 10.3. Lactic acid 2.1 > 1.5.Elevated inflammatory markers, procalcitonin less than 0.1. EKG showed T wave changes in lead I and aVL. Chest  x-ray-newsubtlebasilar opacities may reflect atelectasis or sequelae of known Covid infection. 500 mill bolus given hospitalist admit for Covid pneumonia and chest pain.  Hospital Course:  1-acute resp failure with hypoxia due to COVID-19 -still SOB and easily winded with exertion; not requiring O2 supplementation currently.  -reports no CP at time of discharge. -Patient completed 3 days of IV remdesivir; I will complete oral prednisone usage as instructed. -Advised to maintain adequate hydration and to follow the 3 W's. -PRN bronchodilators.  2-CP with hx of CAD -atypical and most likely associated with PNA process -patient reported increase coughing spells. -troponin neg X2 -no acute ischemic changes on EKG or telemetry. -case discussed with cardiology at time of admission and felt changes on EKG due to repolarization. -continue eliquis, b-blocker and statins -Patient will follow up with cardiology service as previously scheduled.  3-diarrhea -with dehydration and mild hypokalemia -GI symptoms associated with COVID-19 -Significantly improved at time of discharge -Patient advised to maintain adequate hydration.  4-chronic atrial fibrillation -stable and rate controlled. -conitnue metoprolol and continue Eliquis for secondary prevention. -Patient denies palpitations. -Continue outpatient follow-up with cardiology service as previously scheduled.  5-chronic renal failure: stage 3b-moving into stage 4 -Patient eating and drinking much better. -Denies any extra GI losses currently. -Renal function is back to baseline at discharge -Please repeat basic metabolic panel follow-up visit to reassess electrolytes and renal function. -Continue to minimize the use of nephrotoxic agents.  6-chronic systolic HF -stable and compensated -follow daily weights -strict I's and O's and low sodium diet -continue holding diuretics currently -AICD in place -safe to discontinue  telemetry.  7-type 2 diabetes with nephropathy and hyperglycemia -No prior to admission hypoglycemic regimen -Most likely associated with the use of his steroids. -Most recent A1c 7.0 -Continue modified carbohydrate diet -Close  monitoring by PCP as an outpatient.   Procedures:  See below for x-ray reports.  Consultations:  Cardiology service was curbside at the moment of admission (Dr. Golden Hurter); no ischemic work-up recommended.  EKG changes suggesting repolarization only.  Discharge Exam: Vitals:   12/30/18 2027 12/31/18 0503  BP: (!) 158/84 129/87  Pulse: 65 78  Resp: 16 18  Temp: 98.3 F (36.8 C) 98.5 F (36.9 C)  SpO2: 99% 97%    General: Feeling significantly better, no chest pain, no nausea, no vomiting, reports no further diarrhea.  No abdominal pain and tolerating diet without problems.  Patient denies shortness of breath or orthopnea; good oxygen saturation on room air. Cardiovascular: Rate controlled.  No murmurs, no rubs, no gallops; no JVD. Respiratory: Improved air movement bilaterally; positive scattered rhonchi right, no wheezing, no crackles, no using accessory muscles.  Normal respiratory effort. Abdomen: Soft, nontender, nondistended, positive bowel sounds, no guarding. Extremities: No cyanosis, no clubbing. Skin: No rashes, no petechiae, no induration.  Discharge Instructions   Discharge Instructions    (HEART FAILURE PATIENTS) Call MD:  Anytime you have any of the following symptoms: 1) 3 pound weight gain in 24 hours or 5 pounds in 1 week 2) shortness of breath, with or without a dry hacking cough 3) swelling in the hands, feet or stomach 4) if you have to sleep on extra pillows at night in order to breathe.   Complete by: As directed    Diet - low sodium heart healthy   Complete by: As directed    Discharge instructions   Complete by: As directed    Maintain adequate hydration Take medications as prescribed Arrange follow-up with PCP in 10  days Continue to wear mask outside, 6 feet distance apart and constantly wash your hands.   Increase activity slowly   Complete by: As directed    MyChart COVID-19 home monitoring program   Complete by: Dec 31, 2018    Is the patient willing to use the [facility] for home monitoring?: Yes   Temperature monitoring   Complete by: Dec 31, 2018    After how many days would you like to receive a notification of this patient's flowsheet entries?: 1     Allergies as of 12/31/2018      Reactions   Sotalol Other (See Comments)   Torsades   Bactrim [sulfamethoxazole-trimethoprim]    Itchy rash   Ciprofloxacin Nausea Only   Actos [pioglitazone] Swelling   Adhesive [tape] Other (See Comments)   Causes sores   Contrast Media [iodinated Diagnostic Agents] Other (See Comments)   Headache (no action/pre-med required)   Latex Rash   Lipitor [atorvastatin] Other (See Comments)   myalgia      Medication List    STOP taking these medications   predniSONE 10 MG tablet Commonly known as: DELTASONE     TAKE these medications   CENTRUM SILVER ULTRA WOMENS PO Take 1 tablet by mouth daily.   CITRACAL + D PO Take 1 tablet by mouth daily.   dexamethasone 6 MG tablet Commonly known as: DECADRON Take 1 tablet (6 mg total) by mouth daily for 7 days.   diphenoxylate-atropine 2.5-0.025 MG tablet Commonly known as: Lomotil Take 1 tablet by mouth every 8 (eight) hours as needed for diarrhea or loose stools.   Eliquis 2.5 MG Tabs tablet Generic drug: apixaban TAKE  (1)  TABLET TWICE A DAY. What changed: See the new instructions.   metoprolol tartrate 25 MG tablet  Commonly known as: LOPRESSOR TAKE  (1)  TABLET TWICE A DAY. What changed: See the new instructions.   mirtazapine 7.5 MG tablet Commonly known as: REMERON Take 1 tablet (7.5 mg total) by mouth at bedtime. (Taper off of Paxil first)   nitroGLYCERIN 0.4 MG SL tablet Commonly known as: NITROSTAT Place 1 tablet (0.4 mg  total) under the tongue every 5 (five) minutes as needed for chest pain.   pantoprazole 40 MG tablet Commonly known as: PROTONIX Take 40 mg by mouth daily.   rosuvastatin 40 MG tablet Commonly known as: CRESTOR TAKE (1/2) TABLET DAILY. What changed: See the new instructions.   torsemide 20 MG tablet Commonly known as: DEMADEX Take 20 mg by mouth daily as needed (edema).   Vitamin D (Ergocalciferol) 1.25 MG (50000 UT) Caps capsule Commonly known as: DRISDOL Take 1 capsule (50,000 Units total) by mouth every 7 (seven) days.   Vitamin D 1000 units capsule Take 2,000 Units by mouth daily.      Allergies  Allergen Reactions  . Sotalol Other (See Comments)    Torsades   . Bactrim [Sulfamethoxazole-Trimethoprim]     Itchy rash  . Ciprofloxacin Nausea Only  . Actos [Pioglitazone] Swelling  . Adhesive [Tape] Other (See Comments)    Causes sores  . Contrast Media [Iodinated Diagnostic Agents] Other (See Comments)    Headache (no action/pre-med required)  . Latex Rash  . Lipitor [Atorvastatin] Other (See Comments)    myalgia   Follow-up Information    Ronnie Doss M, DO. Schedule an appointment as soon as possible for a visit in 10 day(s).   Specialty: Family Medicine Contact information: [place] [address] [phone]        Sanda Klein, MD .   Specialty: Cardiology Contact information: [address] [place] [address] [phone]           The results of significant diagnostics from this hospitalization (including imaging, microbiology, ancillary and laboratory) are listed below for reference.    Significant Diagnostic Studies: CT Abdomen Pelvis Wo Contrast  Result Date: 12/19/2018 CLINICAL DATA:  Nausea, vomiting EXAM: CT ABDOMEN AND PELVIS WITHOUT CONTRAST TECHNIQUE: Multidetector CT imaging of the abdomen and pelvis was performed following the standard protocol without IV contrast. COMPARISON:  02/05/2018  FINDINGS: Lower chest: Cardiomegaly.  No acute abnormality. Hepatobiliary: No focal hepatic abnormality. Gallbladder unremarkable. Pancreas: No focal abnormality or ductal dilatation. Spleen: No focal abnormality.  Normal size. Adrenals/Urinary Tract: Kidneys are atrophic, left greater than right. Scattered areas of calcifications. Nonobstructing bilateral renal stones. No ureteral stones or hydronephrosis. Adrenal glands and urinary bladder unremarkable. Stomach/Bowel: Normal appendix. Portions of the colon are fluid-filled including the right colon and rectosigmoid colon. Stomach and small bowel decompressed. No evidence of bowel obstruction. Vascular/Lymphatic: Heavily calcified aorta. No evidence of aneurysm or adenopathy. Reproductive: Uterus and adnexa unremarkable.  No mass. Other: No free fluid or free air. Musculoskeletal: Degenerative changes throughout the lumbar spine. No acute bony abnormality. IMPRESSION: Atrophic kidneys bilaterally, left greater than right. Areas of calcifications throughout the kidneys as well as nonobstructing renal stones. No ureteral stones or hydronephrosis. Nonspecific bowel gas pattern with portions of the colon fluid-filled including cecum and rectosigmoid colon. No evidence of bowel obstruction. Cardiomegaly.  Aortic atherosclerosis. Electronically Signed   By: Rolm Baptise M.D.   On: 12/19/2018 21:05   DG Chest Port 1 View  Result Date: 12/28/2018 CLINICAL DATA:  Chest pain EXAM: PORTABLE CHEST 1 VIEW COMPARISON:  08/28/2018 FINDINGS: Pacer defibrillator with power pack over left chest as before. Cardiomediastinal silhouette is stable with cardiac enlargement. No signs of edema, dense consolidation or evidence of pleural effusion. No acute bone finding. Subtle opacities at the lung bases are noted and have developed since the previous exam. IMPRESSION: New subtle basilar opacities may reflect atelectasis or sequela of known COVID infection. No dense consolidation or  pleural effusion. Electronically Signed   By: Zetta Bills M.D.   On: 12/28/2018 15:51    Microbiology: Recent Results (from the past 240 hour(s))  Blood Culture (routine x 2)     Status: None (Preliminary result)   Collection Time: 12/28/18  4:01 PM   Specimen: Right Antecubital; Blood  Result Value Ref Range Status   Specimen Description   Final    RIGHT ANTECUBITAL BOTTLES DRAWN AEROBIC AND ANAEROBIC   Special Requests Blood Culture adequate volume  Final   Culture   Final    NO GROWTH 3 DAYS Performed at [facility], [address] [place], [place] [address]    Report Status PENDING  Incomplete  Blood Culture (routine x 2)     Status: None (Preliminary result)   Collection Time: 12/28/18  4:04 PM   Specimen: BLOOD RIGHT HAND  Result Value Ref Range Status   Specimen Description   Final    BLOOD RIGHT HAND BOTTLES DRAWN AEROBIC AND ANAEROBIC   Special Requests Blood Culture adequate volume  Final   Culture  Setup Time PENDING  Incomplete   Culture   Final    NO GROWTH 3 DAYS Performed at [facility], [address] [place], [place] [address]    Report Status PENDING  Incomplete     Labs: Basic Metabolic Panel: Recent Labs  Lab 12/28/18 1601 12/30/18 0453 12/31/18 0413  NA 135 136 139  K 3.4* 4.3 5.3*  CL 102 108 108  CO2 21* 18* 23  GLUCOSE 267* 387* 208*  BUN 30* 39* 43*  CREATININE 1.52* 1.29* 1.33*  CALCIUM 9.1 8.8* 9.0  MG  --  1.9  --    Liver Function Tests: Recent Labs  Lab 12/28/18 1601 12/30/18 0453 12/31/18 0413  AST 41 18 18  ALT 33 25 19  ALKPHOS 56 48 44  BILITOT 0.5 0.8 0.6  PROT 6.8 6.1* 5.8*  ALBUMIN 3.6 3.1* 2.9*   CBC: Recent Labs  Lab 12/28/18 1601  WBC 10.3  HGB 13.8  HCT 42.8  MCV 93.2  PLT 332   CBG: Recent Labs  Lab 12/30/18 1628 12/30/18 2013 12/31/18 0016 12/31/18 0818 12/31/18 1221  GLUCAP 339* 338* 146* 191* 266*    Signed:  Barton Dubois MD.  [facility] 12/31/2018, 1:47 PM

## 2018-12-31 NOTE — Progress Notes (Signed)
Pt has now received 3rd dose of Ntg 0.3mg  SL. States that chest pain is 6/10 after first dose and unchanged after second dose. VSS (see flowsheet). Skin W&D, alert and oriented. Denies c/o nausea, still says feels SOB. Pt states, "Maybe it was something I ate. It feels like indigestion."  RT in to complete EKG per order.

## 2018-12-31 NOTE — Plan of Care (Signed)
Pt with no SOB, using no oxygen. Urine output WNL. Tolerating po food and fluids without diff.  Problem: Education: Goal: Knowledge of General Education information will improve Description: Including pain rating scale, medication(s)/side effects and non-pharmacologic comfort measures Outcome: Adequate for Discharge   Problem: Health Behavior/Discharge Planning: Goal: Ability to manage health-related needs will improve Outcome: Adequate for Discharge   Problem: Clinical Measurements: Goal: Ability to maintain clinical measurements within normal limits will improve Outcome: Adequate for Discharge Goal: Will remain free from infection Outcome: Adequate for Discharge Goal: Diagnostic test results will improve Outcome: Adequate for Discharge Goal: Respiratory complications will improve Outcome: Adequate for Discharge Goal: Cardiovascular complication will be avoided Outcome: Adequate for Discharge   Problem: Activity: Goal: Risk for activity intolerance will decrease Outcome: Adequate for Discharge   Problem: Nutrition: Goal: Adequate nutrition will be maintained Outcome: Adequate for Discharge   Problem: Coping: Goal: Level of anxiety will decrease Outcome: Adequate for Discharge   Problem: Elimination: Goal: Will not experience complications related to bowel motility Outcome: Adequate for Discharge Goal: Will not experience complications related to urinary retention Outcome: Adequate for Discharge   Problem: Pain Managment: Goal: General experience of comfort will improve Outcome: Adequate for Discharge   Problem: Safety: Goal: Ability to remain free from injury will improve Outcome: Adequate for Discharge   Problem: Skin Integrity: Goal: Risk for impaired skin integrity will decrease Outcome: Adequate for Discharge

## 2019-01-01 ENCOUNTER — Telehealth: Payer: Self-pay | Admitting: Family Medicine

## 2019-01-01 NOTE — Telephone Encounter (Signed)
Patient states Steroids are making her very nervous and having anxiety/. Pt states she does not want to be alone patient has COVID19. What can patient do?

## 2019-01-01 NOTE — Telephone Encounter (Signed)
Steroids can absolutely cause sensation of anxiety and nervousness.  I would recommend taking this with a full meal early in the day if possible.  Unfortunately, there is not much to do with the isolation precautions and guidelines that are recommended for persons with COVID-19.  It is my understanding that some of her other family members also have this and I wonder if they might not be able to stay together since they are both positive.  Otherwise, I would encourage socialization as much as possible by telephone visit or video visit with family.

## 2019-01-02 ENCOUNTER — Telehealth: Payer: Self-pay | Admitting: Cardiovascular Disease

## 2019-01-02 LAB — CULTURE, BLOOD (ROUTINE X 2)
Culture: NO GROWTH
Special Requests: ADEQUATE

## 2019-01-02 NOTE — Telephone Encounter (Signed)
  Pt c/o of Chest Pain: STAT if CP now or developed within 24 hours  1. Are you having CP right now? No but having chest pressure  2. Are you experiencing any other symptoms (ex. SOB, nausea, vomiting, sweating)? no  3. How long have you been experiencing CP? Started back today but had a previous episode while she was in the hospital with corona virus  4. Is your CP continuous or coming and going? continuous  5. Have you taken Nitroglycerin? Not today ?

## 2019-01-02 NOTE — Telephone Encounter (Signed)
Spoke to pt and she just got out of the hospital for Covid and she feels like she is having chest pain. She has tested negative and does not have Covid symptoms. She describes the pain as chest pressure and she feels like her body is in complete shock. Denied other symptoms. Advised pt to take a nitro every 5 minutes for up to 3 doses. Advised to call 911 after 3rd dose. Made an appt with Roby Lofts for 12/21.

## 2019-01-02 NOTE — Telephone Encounter (Signed)
Patient aware and verbalized understanding. °

## 2019-01-03 ENCOUNTER — Telehealth: Payer: Self-pay | Admitting: Family Medicine

## 2019-01-03 NOTE — Telephone Encounter (Signed)
Electrocardiogram from 12/31/2018 indeed showed atrial fibrillation, but the cardiac enzymes were normal. If she is still in atrial fibrillation and feels poorly at the 12/21 appointment, please schedule for cardioversion.

## 2019-01-03 NOTE — Progress Notes (Deleted)
Cardiology Office Note   Date:  01/03/2019   ID:  Renee, [name] 11/05/1931, MRN [id number]  PCP:  [name], DO  Cardiologist:  Renee Klein, MD EP: None  No chief complaint on file.     History of Present Illness:  CHANGE TO VIRTUAL VISIT!  [name] is a 85 y.o. female with PMH of catecholaminergic polymorphic VT status post single-chamber ICD (initial implantation 1993, new ventricular lead 2009, last generator change 2016, [employer]), paroxysmal atrial fibrillation, CAD s/p PCI to LAD and left circumflex 2003 (patent stents at coronary angiography in 2006, 2013 and late 2015), orthostatic hypotension, hyperlipidemia, type 2 diabetes mellitus, stage III (-IV) chronic kidney disease, and recent COVID-19 infection who presents for the evaluation of chest pain.   She was last evaluated by cardiology at an outpatient visit with Dr. Sallyanne [name] 10/2018 at which time she was doing well from a cardiac standpoint. She was found to have a 25 beat run of VT on recent device check, though was asymptomatic. No medication changes occurred at that time and she was recommended to follow-up in 6 months.   Her last ischemic evaluation was a LHC in 2015 which showed patent LAD and ramus stents without obstructive disease, chronic severe disease in the ostial first diagonal which was unchanged.   She was diagnosed with COVID-19 12/19/2018 after presenting to the ED with diarrhea and was subsequently admitted to [facility] 12/28/2018 with acute respiratory failure where she received a 3 day course of IV remdesivir. She had complaints of atypical chest pain at that time which was felt to be related to her PNA. She was also noted to be in atrial fibrillation during that admission.   She presents today with complaints of ongoing chest pain.   Have patient do a remote download on the morning of her exam, at which time Dr. Loletha Grayer can check for continued Afib.   Past Medical  History:  Diagnosis Date  . AICD (automatic cardioverter/defibrillator) present   . Allergy    SESONAL  . Anxiety   . ARTHRITIS   . Arthritis   . Atrial fibrillation ([place])   . CAD (coronary artery disease)    a. history of cardiac arrest 1993;  b. s/p LAD/LCX stenting in 2003;  c. 07/2011 Cath: LM nl, LAD patent stent, D1 80ost, LCX patent stent, RCA min irregs;  d. 04/2012 MV: EF 66%, no ishcemia;  e. 12/2013 Echo: Ef 55%, no rwma, Gr 1 DD, triv AI/MR, mildly dil LA;  f. 12/2013 Lexi MV: intermediate risk - apical ischemia and inf/infsept fixed defect, ? artifactual.  . CAD in native artery 12/31/2013   Previous stents to LAD and Ramus patent on cath today 12/31/13 also There is severe disease in the ostial first diagonal which is unchanged from most recent cardiac catheterization. The right coronary artery could not be engaged selectively but nonselective angiography showed no significant disease in the proximal and midsegment.     . Cataract    DENIES  . Chronic back pain   . Chronic sinus bradycardia   . CKD (chronic kidney disease), stage III   . Clotting disorder (HCC)    DVT  . COLITIS 12/02/2007   Qualifier: Diagnosis of  By: Nils Pyle CMA ([place]), [name]    . Diabetes mellitus without complication ([place])    DENIES  . Diverticulosis of colon (without mention of hemorrhage) 2007   Colonoscopy   . Esophageal stricture    a.  2012 s/p dil.  . Esophagitis, unspecified    a. 2012 EGD  . EXTERNAL HEMORRHOIDS   . GERD (gastroesophageal reflux disease)    omeprazole  . H/O hiatal hernia   . Hiatal hernia    a. 2012 EGD.  Marland Kitchen History of DVT in the past, not on Coumadin now    left  leg  . HYPERCHOLESTEROLEMIA   . Hypertension   . ICD (implantable cardiac defibrillator) in place    a. s/p initial ICD in 1993 in setting of cardiac arrest;  b. 01/2007 gen change: Guidant T135 Vitality DS VR single lead ICD.  . Macular degeneration    gets injecton in eye every 5 weeks- last injection -  05/03/2013   . Myocardial infarction ([place]) 1993  . Near syncope 05/22/2015  . Nephrolithiasis, just saw Dr [name]   . Orthostatic hypotension   . Peripheral vascular disease ([place])    ???  . RECTAL BLEEDING 12/03/2007   Qualifier: Diagnosis of  By: Sharlett Iles MD Byrd Hesselbach   . Unspecified gastritis and gastroduodenitis without mention of hemorrhage    a. 2003 EGD->not noted on 2012 EGD.  Marland Kitchen Unstable angina, neg MI, cath stable.maybe GI 12/30/2013    Past Surgical History:  Procedure Laterality Date  . BREAST BIOPSY Left 11/2013  . CARDIAC CATHETERIZATION    . CARDIAC CATHETERIZATION  01/01/15   patent stents -there is severe disease in ostial 1st diag but without change.   Marland Kitchen CARDIAC DEFIBRILLATOR PLACEMENT  02/05/2007   Guidant  . CARDIOVASCULAR STRESS TEST  12/30/2013   abnormal  . COLONOSCOPY    . coronary stents     . CYSTOSCOPY WITH URETEROSCOPY Right 05/20/2013   Procedure: CYSTOSCOPY, RIGHT URETEROSCOPY STONE EXTRACTION, Insertion of right DOUBLE J STENT ;  Surgeon: Irine Seal, MD;  Location: [facility];  Service: Urology;  Laterality: Right;  . CYSTOSCOPY WITH URETEROSCOPY AND STENT PLACEMENT N/A 06/03/2013   Procedure: SECOND LOOK CYSTOSCOPY WITH URETEROSCOPY  HOLMIUM LASER LITHO AND STONE EXTRACTION Sammie Bench ;  Surgeon: Malka So, MD;  Location: [facility];  Service: Urology;  Laterality: N/A;  . GIVENS CAPSULE STUDY N/A 10/30/2016   Procedure: GIVENS CAPSULE STUDY;  Surgeon: Irene Shipper, MD;  Location: [facility];  Service: Endoscopy;  Laterality: N/A;  NEEDS TO BE ADMITTED FOR OBSERVATION PT. HAS DEFIB  . HEMORROIDECTOMY  80's  . HOLMIUM LASER APPLICATION Right [id number]   Procedure: HOLMIUM LASER APPLICATION;  Surgeon: Irine Seal, MD;  Location: [facility];  Service: Urology;  Laterality: Right;  . HYSTEROSCOPY WITH D & C  09/02/2010   Procedure: DILATATION AND CURETTAGE (D&C) /HYSTEROSCOPY;  Surgeon: Margarette Asal;  Location: [facility];  Service: Gynecology;  Laterality: N/A;   Dilation and Curettage with Hysteroscopy and Polypectomy  . IMPLANTABLE CARDIOVERTER DEFIBRILLATOR (ICD) GENERATOR CHANGE N/A 02/10/2014   Procedure: ICD GENERATOR CHANGE;  Surgeon: Renee Klein, MD;  Location: [facility];  Service: Cardiovascular;  Laterality: N/A;  . LEFT HEART CATHETERIZATION WITH CORONARY ANGIOGRAM N/A 07/28/2011   Procedure: LEFT HEART CATHETERIZATION WITH CORONARY ANGIOGRAM;  Surgeon: Lorretta Harp, MD;  Location: [facility];  Service: Cardiovascular;  Laterality: N/A;  . LEFT HEART CATHETERIZATION WITH CORONARY ANGIOGRAM N/A 12/31/2013   Procedure: LEFT HEART CATHETERIZATION WITH CORONARY ANGIOGRAM;  Surgeon: Wellington Hampshire, MD;  Location: [facility];  Service: Cardiovascular;  Laterality: N/A;     Current Outpatient Medications  Medication Sig Dispense Refill  . Calcium Citrate-Vitamin D (CITRACAL +  D PO) Take 1 tablet by mouth daily.    . Cholecalciferol (VITAMIN D) 1000 UNITS capsule Take 2,000 Units by mouth daily.     Marland Kitchen dexamethasone (DECADRON) 6 MG tablet Take 1 tablet (6 mg total) by mouth daily for 7 days. 7 tablet 0  . diphenoxylate-atropine (LOMOTIL) 2.5-0.025 MG tablet Take 1 tablet by mouth every 8 (eight) hours as needed for diarrhea or loose stools. 30 tablet 1  . ELIQUIS 2.5 MG TABS tablet TAKE  (1)  TABLET TWICE A DAY. (Patient taking differently: Take 2.5 mg by mouth 2 (two) times daily. ) 180 tablet 1  . metoprolol tartrate (LOPRESSOR) 25 MG tablet TAKE  (1)  TABLET TWICE A DAY. (Patient taking differently: Take 25 mg by mouth 2 (two) times daily. ) 180 tablet 0  . mirtazapine (REMERON) 7.5 MG tablet Take 1 tablet (7.5 mg total) by mouth at bedtime. (Taper off of Paxil first) 30 tablet 0  . Multiple Vitamins-Minerals (CENTRUM SILVER ULTRA WOMENS PO) Take 1 tablet by mouth daily.    . nitroGLYCERIN (NITROSTAT) 0.4 MG SL tablet Place 1 tablet (0.4 mg total) under the tongue every 5 (five) minutes as needed for chest pain. 25 tablet 2  .  pantoprazole (PROTONIX) 40 MG tablet Take 40 mg by mouth daily.    . rosuvastatin (CRESTOR) 40 MG tablet TAKE (1/2) TABLET DAILY. (Patient taking differently: Take 20 mg by mouth daily. ) 45 tablet 0  . torsemide (DEMADEX) 20 MG tablet Take 20 mg by mouth daily as needed (edema).    . Vitamin D, Ergocalciferol, (DRISDOL) 1.25 MG (50000 UT) CAPS capsule Take 1 capsule (50,000 Units total) by mouth every 7 (seven) days. 8 capsule 0   No current facility-administered medications for this visit.    Allergies:   Sotalol, Bactrim [sulfamethoxazole-trimethoprim], Ciprofloxacin, Actos [pioglitazone], Adhesive [tape], Contrast media [iodinated diagnostic agents], Latex, and Lipitor [atorvastatin]    Social History:  The patient  reports that she has never smoked. She has never used smokeless tobacco. She reports that she does not drink alcohol or use drugs.   Family History:  The patient's ***family history includes Asthma in her brother; Atrial fibrillation in her sister; Cancer in her daughter; Colon polyps in her daughter; Diabetes in her daughter; Heart attack in her brother and father; Heart disease in her brother; Hyperlipidemia in her sister; Macular degeneration in her sister; Osteoporosis in her sister; Other in her father and mother; Stroke in her mother, sister, and sister; Uterine cancer in her sister.    ROS:  Please see the history of present illness.   Otherwise, review of systems are positive for {NONE DEFAULTED:18576::"none"}.   All other systems are reviewed and negative.    PHYSICAL EXAM: VS:  There were no vitals taken for this visit. , BMI There is no height or weight on file to calculate BMI. GEN: Well nourished, well developed, in no acute distress HEENT: normal Neck: no JVD, carotid bruits, or masses Cardiac: ***RRR; no murmurs, rubs, or gallops,no edema  Respiratory:  clear to auscultation bilaterally, normal work of breathing GI: soft, nontender, nondistended, + BS MS: no  deformity or atrophy Skin: warm and dry, no rash Neuro:  Strength and sensation are intact Psych: euthymic mood, full affect   EKG:  EKG {ACTION; IS/IS [id number] ordered today. The ekg ordered today demonstrates ***   Recent Labs: 02/05/2018: TSH 3.439 12/28/2018: Hemoglobin 13.8; Platelets 332 12/30/2018: Magnesium 1.9 12/31/2018: ALT 19; BUN 43; Creatinine, Ser 1.33; Potassium  5.3; Sodium 139    Lipid Panel    Component Value Date/Time   CHOL 139 10/23/2018 0947   CHOL 140 07/31/2012 1019   TRIG 211 (H) 12/28/2018 1601   TRIG 115 06/07/2015 1111   TRIG 161 (H) 07/31/2012 1019   HDL 53 10/23/2018 0947   HDL 68 06/07/2015 1111   HDL 43 07/31/2012 1019   CHOLHDL 2.6 10/23/2018 0947   CHOLHDL 2.8 09/02/2010 0629   VLDL 34 09/02/2010 0629   LDLCALC 65 10/23/2018 0947   LDLCALC 65 09/09/2013 0841   LDLCALC 65 07/31/2012 1019      Wt Readings from Last 3 Encounters:  12/29/18 138 lb 7.2 oz (62.8 kg)  12/19/18 140 lb (63.5 kg)  10/24/18 150 lb 4.8 oz (68.2 kg)      Other studies Reviewed: Additional studies/ records that were reviewed today include:   Left heart catheterization 2015: Final Conclusions:   1. Patent LAD and ramus stent with no evidence of obstructive disease. There is severe disease in the ostial first diagonal which is unchanged from most recent cardiac catheterization. The right coronary artery could not be engaged selectively but nonselective angiography showed no significant disease in the proximal and midsegment.  2. Normal LV and diastolic pressure.   Recommendations:  Recommend avoiding cardiac catheterization via the right radial artery in the future due to severe tortuosity in the innominate artery. Continue medical therapy for coronary artery disease. Can resume anticoagulation with Eliquis tomorrow.     ASSESSMENT AND PLAN:  1.  ***   Current medicines are reviewed at length with the patient today.  The patient {ACTIONS; HAS/DOES  NOT HAVE:19233} concerns regarding medicines.  The following changes have been made:  {PLAN; NO CHANGE:13088:s}  Labs/ tests ordered today include: *** No orders of the defined types were placed in this encounter.    Disposition:   FU with *** in {gen number [id number] {Days to years:10300}  Signed, Abigail Butts, PA-C  01/03/2019 7:06 PM

## 2019-01-03 NOTE — Telephone Encounter (Signed)
That would be somewhat unusual.  However, if she is having problems passing stool she can certainly add a stool softener like Colace versus mix 1 capful of MiraLAX in 8 ounces of water and drink daily as needed constipation.  If no improvement or if any worsening or red flag signs or symptoms.  Please have her get checked out

## 2019-01-03 NOTE — Telephone Encounter (Signed)
Agree with your recommendations.  She has complained of feeling this way before when she is in atrial fibrillation.  Her defibrillator is a single ventricular lead device and will not be able to report this.

## 2019-01-03 NOTE — Telephone Encounter (Signed)
Yes.  She can.  Stop mirtazapine.  No meds for 24 hours.  Then start back on Paxil.

## 2019-01-03 NOTE — Telephone Encounter (Signed)
Pt notified and states that since she has started the mirtazapine that she has been feeling strange and doesn't feel like herself. She has weaned off the Paxil but states that she felt so much better when she was taking Paxil. Wants to know if she can switch back. She said the hospital started the mirtazapine. Please advise

## 2019-01-06 ENCOUNTER — Emergency Department (HOSPITAL_COMMUNITY)
Admission: EM | Admit: 2019-01-06 | Discharge: 2019-01-06 | Disposition: A | Discharge status: Home / Self Care | Payer: Medicare Other | Source: Home / Self Care | Attending: Emergency Medicine | Admitting: Emergency Medicine

## 2019-01-06 ENCOUNTER — Telehealth: Payer: Self-pay | Admitting: Cardiovascular Disease

## 2019-01-06 ENCOUNTER — Encounter (HOSPITAL_COMMUNITY): Payer: Self-pay | Admitting: Emergency Medicine

## 2019-01-06 ENCOUNTER — Encounter: Payer: Self-pay | Admitting: Family

## 2019-01-06 ENCOUNTER — Ambulatory Visit: Payer: Medicare Other | Admitting: Medical

## 2019-01-06 ENCOUNTER — Telehealth: Payer: Self-pay | Admitting: Family Medicine

## 2019-01-06 ENCOUNTER — Ambulatory Visit (INDEPENDENT_AMBULATORY_CARE_PROVIDER_SITE_OTHER): Payer: Medicare Other | Admitting: Family

## 2019-01-06 DIAGNOSIS — Z7901 Long term (current) use of anticoagulants: Secondary | ICD-10-CM | POA: Insufficient documentation

## 2019-01-06 DIAGNOSIS — R739 Hyperglycemia, unspecified: Secondary | ICD-10-CM | POA: Diagnosis not present

## 2019-01-06 DIAGNOSIS — E1122 Type 2 diabetes mellitus with diabetic chronic kidney disease: Secondary | ICD-10-CM | POA: Insufficient documentation

## 2019-01-06 DIAGNOSIS — I2510 Atherosclerotic heart disease of native coronary artery without angina pectoris: Secondary | ICD-10-CM

## 2019-01-06 DIAGNOSIS — U071 COVID-19: Secondary | ICD-10-CM | POA: Insufficient documentation

## 2019-01-06 DIAGNOSIS — N183 Chronic kidney disease, stage 3 unspecified: Secondary | ICD-10-CM | POA: Insufficient documentation

## 2019-01-06 DIAGNOSIS — Z79899 Other long term (current) drug therapy: Secondary | ICD-10-CM | POA: Insufficient documentation

## 2019-01-06 DIAGNOSIS — Z91040 Latex allergy status: Secondary | ICD-10-CM | POA: Insufficient documentation

## 2019-01-06 DIAGNOSIS — I4891 Unspecified atrial fibrillation: Secondary | ICD-10-CM | POA: Insufficient documentation

## 2019-01-06 DIAGNOSIS — E1165 Type 2 diabetes mellitus with hyperglycemia: Secondary | ICD-10-CM

## 2019-01-06 DIAGNOSIS — Z95811 Presence of heart assist device: Secondary | ICD-10-CM | POA: Insufficient documentation

## 2019-01-06 DIAGNOSIS — K6289 Other specified diseases of anus and rectum: Secondary | ICD-10-CM | POA: Diagnosis not present

## 2019-01-06 DIAGNOSIS — Z86718 Personal history of other venous thrombosis and embolism: Secondary | ICD-10-CM | POA: Insufficient documentation

## 2019-01-06 LAB — URINALYSIS, ROUTINE W REFLEX MICROSCOPIC
Bacteria, UA: NONE SEEN
Bilirubin Urine: NEGATIVE
Glucose, UA: >=500 mg/dL — AB
Hgb urine dipstick: NEGATIVE
Ketones, ur: NEGATIVE mg/dL
Leukocytes,Ua: NEGATIVE
Nitrite: NEGATIVE
Protein, ur: NEGATIVE mg/dL
Specific Gravity, Urine: 1.003 — ABNORMAL LOW (ref 1.005–1.030)
pH: 6 (ref 5.0–8.0)

## 2019-01-06 LAB — CBG MONITORING, ED
Glucose-Capillary: 404 mg/dL — ABNORMAL HIGH (ref 70–99)
Glucose-Capillary: 292 mg/dL — ABNORMAL HIGH (ref 70–99)
Glucose-Capillary: 279 mg/dL — ABNORMAL HIGH (ref 70–99)

## 2019-01-06 LAB — BASIC METABOLIC PANEL
Anion gap: 9 (ref 5–15)
BUN: 32 mg/dL — ABNORMAL HIGH (ref 8–23)
CO2: 25 mmol/L (ref 22–32)
Calcium: 8.8 mg/dL — ABNORMAL LOW (ref 8.9–10.3)
Chloride: 94 mmol/L — ABNORMAL LOW (ref 98–111)
Creatinine, Ser: 1.35 mg/dL — ABNORMAL HIGH (ref 0.44–1.00)
GFR calc Af Amer: 41 mL/min — ABNORMAL LOW (ref >60)
GFR calc non Af Amer: 36 mL/min — ABNORMAL LOW (ref >60)
Glucose, Bld: 446 mg/dL — ABNORMAL HIGH (ref 70–99)
Potassium: 4.7 mmol/L (ref 3.5–5.1)
Sodium: 128 mmol/L — ABNORMAL LOW (ref 135–145)

## 2019-01-06 LAB — TROPONIN I (HIGH SENSITIVITY)
Troponin I (High Sensitivity): 14 ng/L (ref <18)
Troponin I (High Sensitivity): 15 ng/L (ref <18)

## 2019-01-06 LAB — CBC
HCT: 35.9 % — ABNORMAL LOW (ref 36.0–46.0)
Hemoglobin: 11.7 g/dL — ABNORMAL LOW (ref 12.0–15.0)
MCH: 30.3 pg (ref 26.0–34.0)
MCHC: 32.6 g/dL (ref 30.0–36.0)
MCV: 93 fL (ref 80.0–100.0)
Platelets: 221 10*3/uL (ref 150–400)
RBC: 3.86 MIL/uL — ABNORMAL LOW (ref 3.87–5.11)
RDW: 13 % (ref 11.5–15.5)
WBC: 9.3 10*3/uL (ref 4.0–10.5)
nRBC: 0 % (ref 0.0–0.2)

## 2019-01-06 MED ORDER — INSULIN ASPART 100 UNIT/ML ~~LOC~~ SOLN: 8.0000 [IU] | Freq: Once | SUBCUTANEOUS | Status: DC

## 2019-01-06 MED ORDER — SODIUM CHLORIDE 0.9 % IV BOLUS
500.0000 mL | Freq: Once | INTRAVENOUS | Status: AC
  Administered 2019-01-06: 500 mL via INTRAVENOUS

## 2019-01-06 MED ORDER — INSULIN ASPART 100 UNIT/ML ~~LOC~~ SOLN
8.0000 [IU] | Freq: Once | SUBCUTANEOUS | Status: AC
  Administered 2019-01-06: 8 [IU] via SUBCUTANEOUS

## 2019-01-06 NOTE — Telephone Encounter (Signed)
Patient aware and verbalizes understanding. 

## 2019-01-06 NOTE — Progress Notes (Signed)
   Virtual Visit via telephone Note Due to COVID-19 pandemic this visit was conducted virtually. This visit type was conducted due to national recommendations for restrictions regarding the COVID-19 Pandemic (e.g. social distancing, sheltering in place) in an effort to limit this patient's exposure and mitigate transmission in our community. All issues noted in this document were discussed and addressed.  A physical exam was not performed with this format.  I connected with ISRA MATHEW on 01/06/19 at 12:51 pm by telephone and verified that I am speaking with the correct person using two identifiers. MESHIA CRAPPS is currently located at home and no one is currently with her during visit. The provider, Evelina Dun, FNP is located in their office at time of visit.  I discussed the limitations, risks, security and privacy concerns of performing an evaluation and management service by telephone and the availability of in person appointments. I also discussed with the patient that there may be a patient responsible charge related to this service. The patient expressed understanding and agreed to proceed.   History and Present Illness:  Pt calls the office today with elevated blood glucose. She was diagnosed with COVID 12/28/18. She has just completed her dexamethasone. She states her blood glucose this morning 528 this morning. She reports she doesn't "feel right".  Diabetes She presents for her follow-up diabetic visit. She has type 2 diabetes mellitus. Her disease course has been fluctuating. There are no hypoglycemic associated symptoms. Associated symptoms include foot paresthesias, polydipsia, polyphagia and polyuria. Pertinent negatives for diabetes include no blurred vision. Symptoms are stable. Risk factors for coronary artery disease include dyslipidemia, hypertension and sedentary lifestyle. (528)      Review of Systems  Eyes: Negative for blurred vision.  Endo/Heme/Allergies:  Positive for polydipsia and polyphagia.     Observations/Objective: No SOB or distress noted   Assessment and Plan: ATTALIA GRONAU comes in today with chief complaint of No chief complaint on file.   Diagnosis and orders addressed:  1. Type 2 diabetes mellitus with hyperglycemia, unspecified whether long term insulin use ([place])  2. Blood glucose elevated  Since blood glucose is 528 and having memory changes she needs to go to ED.  She is COVID positive, but breathing stable She completed two rounds of dexamethasone, hold off on any more steroids at this time.        I discussed the assessment and treatment plan with the patient. The patient was provided an opportunity to ask questions and all were answered. The patient agreed with the plan and demonstrated an understanding of the instructions.   The patient was advised to call back or seek an in-person evaluation if the symptoms worsen or if the condition fails to improve as anticipated.  The above assessment and management plan was discussed with the patient. The patient verbalized understanding of and has agreed to the management plan. Patient is aware to call the clinic if symptoms persist or worsen. Patient is aware when to return to the clinic for a follow-up visit. Patient educated on when it is appropriate to go to the emergency department.   Time call ended:  1:10 pm  I provided 19 minutes of non-face-to-face time during this encounter.    Evelina Dun, FNP

## 2019-01-06 NOTE — ED Notes (Signed)
Patient verbalizes understanding of discharge instructions. Opportunity for questioning and answers were provided. Armband removed by staff, pt discharged from ED in wheelchair with family to home.

## 2019-01-06 NOTE — Telephone Encounter (Signed)
  Pt c/o of Chest Pain: STAT if CP now or developed within 24 hours  1. Are you having CP right now? no  2. Are you experiencing any other symptoms (ex. SOB, nausea, vomiting, sweating)? Blood sugar was high  3. How long have you been experiencing CP? 3 or 4 days  4. Is your CP continuous or coming and going? Comes and goes  5. Have you taken Nitroglycerin? Took one on 12/18.  Patients daughter calling stating the patient has been having pressure and tightness in her chest for the past few days.  ?

## 2019-01-06 NOTE — Discharge Instructions (Addendum)
Your blood sugar was elevated this evening as a result of the steroids you have been taking.  Your blood sugar has come down with insulin and fluids and will continue to decrease since you are no longer on the steroids.  Follow-up with your primary care doctor.  Stop taking the steroids as she recommended

## 2019-01-06 NOTE — ED Provider Notes (Signed)
[facility] EMERGENCY DEPARTMENT Provider Note   CSN: [id number] Arrival date & time: 01/06/19  1651     History Chief Complaint  Patient presents with  . Covid +  . Hyperglycemia  . Chest Pain    [name] is a 85 y.o. female.  HPI   Pt presents to to the ED for elevated blood sugar.  Patient was recently admitted to hospital for COVID-19.  She was admitted on December 12 and discharged on December 15.  Patient was discharged home on a course of steroids.  She did note that her blood sugar was elevated this today.  It was up into the 500s.  She called her primary doctor and they suggested she come to the emergency room for evaluation.  Patient denies any shortness of breath.  She has not had any nausea or vomiting.  Patient states she has irritable bowel and has been constipated for a few days but did manage to have a bowel movement today.  Patient states she has history of atrial fibrillation and has been having some chest discomfort with that.  She feels like she is back in atrial fibrillation.  She has been taking her home medications..  Past Medical History:  Diagnosis Date  . AICD (automatic cardioverter/defibrillator) present   . Allergy    SESONAL  . Anxiety   . ARTHRITIS   . Arthritis   . Atrial fibrillation ([place])   . CAD (coronary artery disease)    a. history of cardiac arrest 1993;  b. s/p LAD/LCX stenting in 2003;  c. 07/2011 Cath: LM nl, LAD patent stent, D1 80ost, LCX patent stent, RCA min irregs;  d. 04/2012 MV: EF 66%, no ishcemia;  e. 12/2013 Echo: Ef 55%, no rwma, Gr 1 DD, triv AI/MR, mildly dil LA;  f. 12/2013 Lexi MV: intermediate risk - apical ischemia and inf/infsept fixed defect, ? artifactual.  . CAD in native artery 12/31/2013   Previous stents to LAD and Ramus patent on cath today 12/31/13 also There is severe disease in the ostial first diagonal which is unchanged from most recent cardiac catheterization. The right coronary artery  could not be engaged selectively but nonselective angiography showed no significant disease in the proximal and midsegment.     . Cataract    DENIES  . Chronic back pain   . Chronic sinus bradycardia   . CKD (chronic kidney disease), stage III   . Clotting disorder (HCC)    DVT  . COLITIS 12/02/2007   Qualifier: Diagnosis of  By: Nils Pyle CMA ([place]), [name]    . Diabetes mellitus without complication ([place])    DENIES  . Diverticulosis of colon (without mention of hemorrhage) 2007   Colonoscopy   . Esophageal stricture    a. 2012 s/p dil.  . Esophagitis, unspecified    a. 2012 EGD  . EXTERNAL HEMORRHOIDS   . GERD (gastroesophageal reflux disease)    omeprazole  . H/O hiatal hernia   . Hiatal hernia    a. 2012 EGD.  Marland Kitchen History of DVT in the past, not on Coumadin now    left  leg  . HYPERCHOLESTEROLEMIA   . Hypertension   . ICD (implantable cardiac defibrillator) in place    a. s/p initial ICD in 1993 in setting of cardiac arrest;  b. 01/2007 gen change: Guidant T135 Vitality DS VR single lead ICD.  . Macular degeneration    gets injecton in eye every 5 weeks- last injection - 05/03/2013   .  Myocardial infarction ([place]) 1993  . Near syncope 05/22/2015  . Nephrolithiasis, just saw Dr [name]   . Orthostatic hypotension   . Peripheral vascular disease ([place])    ???  . RECTAL BLEEDING 12/03/2007   Qualifier: Diagnosis of  By: Sharlett Iles MD Byrd Hesselbach   . Unspecified gastritis and gastroduodenitis without mention of hemorrhage    a. 2003 EGD->not noted on 2012 EGD.  Marland Kitchen Unstable angina, neg MI, cath stable.maybe GI 12/30/2013    Patient Active Problem List   Diagnosis Date Noted  . Pneumonia due to COVID-19 virus 12/28/2018  . Chronic diarrhea 09/18/2018  . Rectal pain 09/18/2018  . Osteoarthritis of right knee 03/29/2018  . Gastroesophageal reflux disease   . Diarrhea of presumed infectious origin 02/06/2018  . Hypertension associated with diabetes ([place]) 12/21/2017  .  Degenerative scoliosis 02/19/2017  . Burst fracture of lumbar vertebra ([place]) 02/19/2017  . History of drug-induced prolonged QT interval with torsade de pointes 11/01/2016  . Iron deficiency anemia 10/30/2016  . Long term current use of anticoagulant 02/17/2016  . Near syncope 05/22/2015  . Diabetes mellitus type 2, diet-controlled ([place]) 09/14/2014  . ICD (implantable cardioverter-defibrillator) battery depletion 01/20/2014  . Abnormal nuclear stress test, 12/30/13 12/31/2013  . Coronary artery disease involving native coronary artery of native heart without angina pectoris 12/31/2013  . Paroxysmal atrial fibrillation, chads2 Vasc2 score of 5, on eliquis 10/16/2013  . Catecholaminergic polymorphic ventricular tachycardia ([place]) 10/16/2013  . Ureteral stone with hydronephrosis 05/20/2013  . Metabolic syndrome [id number]  . Orthostatic hypotension 07/27/2011  . Chest pain, r/o cardiac, negative MI 07/25/2011  . Weakness 07/25/2011    Class: Acute  . Chronic sinus bradycardia 07/25/2011  . ICD (implantable cardioverter-defibrillator) in place 07/25/2011  . CKD (chronic kidney disease) stage 3, GFR 30-59 ml/min (HCC) 07/25/2011    Class: Chronic  . History of DVT in the past, on eliquis now 07/25/2011    Class: History of  . Nephrolithiasis, just saw Dr [name] 07/25/2011    Class: History of  . DYSPHAGIA 03/24/2010  . Chronic ischemic heart disease 12/03/2007  . Irritable bowel syndrome with diarrhea 12/03/2007  . EPIGASTRIC PAIN 12/03/2007  . Hyperlipidemia associated with type 2 diabetes mellitus ([place]) 12/02/2007    Class: History of  . EXTERNAL HEMORRHOIDS 12/02/2007    Class: History of  . ESOPHAGITIS 12/02/2007  . GASTRITIS 12/02/2007  . DIVERTICULOSIS, COLON 12/02/2007  . ARTHRITIS 12/02/2007    Class: Chronic    Past Surgical History:  Procedure Laterality Date  . BREAST BIOPSY Left 11/2013  . CARDIAC CATHETERIZATION    . CARDIAC CATHETERIZATION  01/01/15   patent  stents -there is severe disease in ostial 1st diag but without change.   Marland Kitchen CARDIAC DEFIBRILLATOR PLACEMENT  02/05/2007   Guidant  . CARDIOVASCULAR STRESS TEST  12/30/2013   abnormal  . COLONOSCOPY    . coronary stents     . CYSTOSCOPY WITH URETEROSCOPY Right 05/20/2013   Procedure: CYSTOSCOPY, RIGHT URETEROSCOPY STONE EXTRACTION, Insertion of right DOUBLE J STENT ;  Surgeon: Irine Seal, MD;  Location: [facility];  Service: Urology;  Laterality: Right;  . CYSTOSCOPY WITH URETEROSCOPY AND STENT PLACEMENT N/A 06/03/2013   Procedure: SECOND LOOK CYSTOSCOPY WITH URETEROSCOPY  HOLMIUM LASER LITHO AND STONE EXTRACTION Sammie Bench ;  Surgeon: Malka So, MD;  Location: [facility];  Service: Urology;  Laterality: N/A;  . GIVENS CAPSULE STUDY N/A 10/30/2016   Procedure: GIVENS CAPSULE STUDY;  Surgeon: Irene Shipper,  MD;  Location: [facility];  Service: Endoscopy;  Laterality: N/A;  NEEDS TO BE ADMITTED FOR OBSERVATION PT. HAS DEFIB  . HEMORROIDECTOMY  80's  . HOLMIUM LASER APPLICATION Right [id number]   Procedure: HOLMIUM LASER APPLICATION;  Surgeon: Irine Seal, MD;  Location: [facility];  Service: Urology;  Laterality: Right;  . HYSTEROSCOPY WITH D & C  09/02/2010   Procedure: DILATATION AND CURETTAGE (D&C) /HYSTEROSCOPY;  Surgeon: Margarette Asal;  Location: [facility];  Service: Gynecology;  Laterality: N/A;  Dilation and Curettage with Hysteroscopy and Polypectomy  . IMPLANTABLE CARDIOVERTER DEFIBRILLATOR (ICD) GENERATOR CHANGE N/A 02/10/2014   Procedure: ICD GENERATOR CHANGE;  Surgeon: Sanda Klein, MD;  Location: [facility];  Service: Cardiovascular;  Laterality: N/A;  . LEFT HEART CATHETERIZATION WITH CORONARY ANGIOGRAM N/A 07/28/2011   Procedure: LEFT HEART CATHETERIZATION WITH CORONARY ANGIOGRAM;  Surgeon: Lorretta Harp, MD;  Location: [facility] CATH LAB;  Service: Cardiovascular;  Laterality: N/A;  . LEFT HEART CATHETERIZATION WITH CORONARY ANGIOGRAM N/A 12/31/2013   Procedure: LEFT HEART CATHETERIZATION WITH  CORONARY ANGIOGRAM;  Surgeon: Wellington Hampshire, MD;  Location: [facility];  Service: Cardiovascular;  Laterality: N/A;     OB History   No obstetric history on file.     Family History  Problem Relation Age of Onset  . Stroke Mother   . Other Mother        brain tumor  . Other Father        MI  . Heart attack Father   . Stroke Sister   . Macular degeneration Sister   . Diabetes Daughter   . Cancer Daughter        ovarian  . Colon polyps Daughter   . Atrial fibrillation Sister   . Hyperlipidemia Sister   . Osteoporosis Sister   . Stroke Sister   . Uterine cancer Sister   . Heart attack Brother   . Heart disease Brother   . Asthma Brother   . Breast cancer Neg Hx     Social History   Tobacco Use  . Smoking status: Never Smoker  . Smokeless tobacco: Never Used  Substance Use Topics  . Alcohol use: No  . Drug use: No    Home Medications Prior to Admission medications   Medication Sig Start Date End Date Taking? Authorizing Provider  Calcium Citrate-Vitamin D (CITRACAL + D PO) Take 1 tablet by mouth daily.    [provider]  Cholecalciferol (VITAMIN D) 1000 UNITS capsule Take 2,000 Units by mouth daily.     [provider]  diphenoxylate-atropine (LOMOTIL) 2.5-0.025 MG tablet Take 1 tablet by mouth every 8 (eight) hours as needed for diarrhea or loose stools. 05/16/18 05/16/19  Chipper Herb, MD  ELIQUIS 2.5 MG TABS tablet TAKE  (1)  TABLET TWICE A DAY. Patient taking differently: Take 2.5 mg by mouth 2 (two) times daily.  10/30/18   Croitoru, Mihai, MD  metoprolol tartrate (LOPRESSOR) 25 MG tablet TAKE  (1)  TABLET TWICE A DAY. Patient taking differently: Take 25 mg by mouth 2 (two) times daily.  11/12/18   Croitoru, Mihai, MD  mirtazapine (REMERON) 7.5 MG tablet Take 1 tablet (7.5 mg total) by mouth at bedtime. (Taper off of Paxil first) 12/26/18   [name], DO  Multiple Vitamins-Minerals (CENTRUM SILVER ULTRA WOMENS PO) Take 1 tablet  by mouth daily.    [provider]  nitroGLYCERIN (NITROSTAT) 0.4 MG SL tablet Place 1 tablet (0.4 mg total) under the  tongue every 5 (five) minutes as needed for chest pain. 12/20/18   Claretta Fraise, MD  pantoprazole (PROTONIX) 40 MG tablet Take 40 mg by mouth daily. 09/30/18   [provider]  rosuvastatin (CRESTOR) 40 MG tablet TAKE (1/2) TABLET DAILY. Patient taking differently: Take 20 mg by mouth daily.  10/22/18   [name], DO  torsemide (DEMADEX) 20 MG tablet Take 20 mg by mouth daily as needed (edema).    [provider]  Vitamin D, Ergocalciferol, (DRISDOL) 1.25 MG (50000 UT) CAPS capsule Take 1 capsule (50,000 Units total) by mouth every 7 (seven) days. 12/20/18   [name], DO  amLODipine (NORVASC) 2.5 MG tablet Take 1 tablet (2.5 mg total) by mouth daily. 03/04/18 08/28/18  Croitoru, Mihai, MD    Allergies    Sotalol, Bactrim [sulfamethoxazole-trimethoprim], Ciprofloxacin, Actos [pioglitazone], Adhesive [tape], Contrast media [iodinated diagnostic agents], Latex, and Lipitor [atorvastatin]  Review of Systems   Review of Systems  All other systems reviewed and are negative.   Physical Exam Updated Vital Signs BP 137/89   Pulse 65   Temp 98.5 F (36.9 C) (Oral)   Resp 13   Ht 1.6 m (5\' 3" )   Wt 63.5 kg   SpO2 96%   BMI 24.80 kg/m   Physical Exam Vitals and nursing note reviewed.  Constitutional:      General: She is not in acute distress.    Appearance: She is well-developed.  HENT:     Head: Normocephalic and atraumatic.     Right Ear: External ear normal.     Left Ear: External ear normal.  Eyes:     General: No scleral icterus.       Right eye: No discharge.        Left eye: No discharge.     Conjunctiva/sclera: Conjunctivae normal.  Neck:     Trachea: No tracheal deviation.  Cardiovascular:     Rate and Rhythm: Normal rate. Rhythm irregular.  Pulmonary:     Effort: Pulmonary effort is normal. No respiratory  distress.     Breath sounds: Normal breath sounds. No stridor. No wheezing or rales.  Abdominal:     General: Bowel sounds are normal. There is no distension.     Palpations: Abdomen is soft.     Tenderness: There is no abdominal tenderness. There is no guarding or rebound.  Musculoskeletal:        General: No tenderness.     Cervical back: Neck supple.  Skin:    General: Skin is warm and dry.     Findings: No rash.  Neurological:     Mental Status: She is alert.     Cranial Nerves: No cranial nerve deficit (no facial droop, extraocular movements intact, no slurred speech).     Sensory: No sensory deficit.     Motor: No abnormal muscle tone or seizure activity.     Coordination: Coordination normal.     ED Results / Procedures / Treatments   Labs (all labs ordered are listed, but only abnormal results are displayed) Labs Reviewed  BASIC METABOLIC PANEL - Abnormal; Notable for the following components:      Result Value   Sodium 128 (*)    Chloride 94 (*)    Glucose, Bld 446 (*)    BUN 32 (*)    Creatinine, Ser 1.35 (*)    Calcium 8.8 (*)    GFR calc non Af Amer 36 (*)    GFR calc Af  Amer 41 (*)    All other components within normal limits  CBC - Abnormal; Notable for the following components:   RBC 3.86 (*)    Hemoglobin 11.7 (*)    HCT 35.9 (*)    All other components within normal limits  URINALYSIS, ROUTINE W REFLEX MICROSCOPIC - Abnormal; Notable for the following components:   Color, Urine STRAW (*)    Specific Gravity, Urine 1.003 (*)    Glucose, UA >=500 (*)    All other components within normal limits  CBG MONITORING, ED - Abnormal; Notable for the following components:   Glucose-Capillary 404 (*)    All other components within normal limits  CBG MONITORING, ED - Abnormal; Notable for the following components:   Glucose-Capillary 292 (*)    All other components within normal limits  CBG MONITORING, ED - Abnormal; Notable for the following components:    Glucose-Capillary 279 (*)    All other components within normal limits  URINE CULTURE  TROPONIN I (HIGH SENSITIVITY)  TROPONIN I (HIGH SENSITIVITY)    EKG EKG Interpretation  Date/Time:  Monday January 06 2019 18:15:59 EST Ventricular Rate:  74 PR Interval:  158 QRS Duration: 102 QT Interval:  416 QTC Calculation: 461 R Axis:   84 Text Interpretation: Normal sinus rhythm Normal ECG sinus rhythm has replaced atrial flutter with variable block Confirmed by Dorie Rank [phone]) on 01/06/2019 6:40:50 PM   Radiology No results found.  Procedures Procedures (including critical care time)  Medications Ordered in ED Medications  sodium chloride 0.9 % bolus 500 mL (0 mLs Intravenous Stopped 01/06/19 2110)  insulin aspart (novoLOG) injection 8 Units (8 Units Subcutaneous Given 01/06/19 1924)    ED Course  I have reviewed the triage vital signs and the nursing notes.  Pertinent labs & imaging results that were available during my care of the patient were reviewed by me and considered in my medical decision making (see chart for details).  Clinical Course as of Jan 05 2237  Mon Jan 06, 2019  [facility] reviewed.  Hyperglycemia and hyponatremia noted.     [JK]  2021 High-sensitivity troponins unchanged compared to previous   [JK]  2100 Blood sugar has improved with insulin and fluids.   [JK]  2114 Reviewed the findings with the patient.  She now states she is having some burning with urination.  She would like to have her urine checked.   [JK]    Clinical Course User Index [JK] Dorie Rank, MD   MDM Rules/Calculators/A&P                     Patient's laboratory tests were notable for hyperglycemia and hyponatremia associated with her hyperglycemia.  Patient was treated with IV fluids and insulin.  Her sugar improved and was down to 279.  I suspect this will continue to trend downward as she is no longer taking steroids and normally is not on diabetes medications.  Patient also  had some vague chest discomfort.  She is in atrial fibrillation but has a history of this.  Her troponins were normal.  Her symptoms are not concerning for acute coronary syndrome.  Patient did mention she had some urinary discomfort so urinalysis was sent off.  This is not clearly an infection.  I will send off a culture.  Patient is concerned that she lives at home alone but I do not see indication for her to be admitted to the hospital at this time.  She has  been monitored for several hours and is hemodynamically stable.  I believe the patient can be safely discharged.  Final Clinical Impression(s) / ED Diagnoses Final diagnoses:  Hyperglycemia    Rx / DC Orders ED Discharge Orders    None       Dorie Rank, MD 01/06/19 2238

## 2019-01-06 NOTE — Telephone Encounter (Signed)
Spoke with pt's daughter who report pt has been c/o of chest pressure off and on for about a week. She report pt was recently seen in ED on 12/12 for COVID and since then she has not been feeling well. Daughter report pt had an appointment scheduled for today 12/21 but pt wanted her to cancel because she is too weak to come into office. Daughter informed that pt need to be evaluated further. Daughter report she is going to call EMS and have pt transported to ED.

## 2019-01-06 NOTE — ED Notes (Signed)
Pt informed this NT of worsening chest pain. This NT took pt to triage area and did repeat EKG and repeat vital signs. EKG signed by Sedonia Small, MD.

## 2019-01-06 NOTE — ED Triage Notes (Signed)
Patient states she was tested positive for covid about a week ago and was supposed to have an echo done today but started having chest pain. Patient also reports CBG in the 500s since she has been on steroids. Chest pain is intermittent, feels light a weight pressing on her chest.

## 2019-01-06 NOTE — ED Notes (Signed)
Pt ambulated well at baseline with no obvious gait/mobility issues. No pain or dizziness were expressed.

## 2019-01-07 ENCOUNTER — Other Ambulatory Visit: Payer: Self-pay | Admitting: Family Medicine

## 2019-01-07 NOTE — Telephone Encounter (Signed)
Follow Up   Patient is calling in to follow up with Nurse about her chest pain. States that it still has not eased up. States that the only time the chest pressure does ease up is when she takes nitroglycerin. Patient went to the ER and was discharged this morning at 1:30 am. Patient wants to speak with the nurse about everything.

## 2019-01-07 NOTE — Telephone Encounter (Signed)
Spoke to patient she stated she has been having chest pain.Stated she went to ED yesterday for elevated blood sugar.Stated she was given insulin.Her blood sugar this morning 236.Stated she had chest pain around 11:00 am this morning.She took NTG x1 with relief.No chest pain at present.Appointment offered for tomorrow with Jory Sims DNP.Patient stated she is unable to come then.Appointment scheduled with Almyra Deforest PA 12/29 at 3:30 pm.She will go to ED if chest pain worsens.

## 2019-01-08 ENCOUNTER — Emergency Department (HOSPITAL_COMMUNITY): Payer: Medicare Other

## 2019-01-08 ENCOUNTER — Inpatient Hospital Stay (HOSPITAL_COMMUNITY)
Admission: EM | Admit: 2019-01-08 | Discharge: 2019-01-12 | DRG: 393 | Disposition: A | Discharge status: Home / Self Care | Payer: Medicare Other | Attending: Internal Medicine | Admitting: Internal Medicine

## 2019-01-08 ENCOUNTER — Encounter (HOSPITAL_COMMUNITY): Payer: Self-pay | Admitting: Emergency Medicine

## 2019-01-08 ENCOUNTER — Other Ambulatory Visit: Payer: Self-pay

## 2019-01-08 DIAGNOSIS — I447 Left bundle-branch block, unspecified: Secondary | ICD-10-CM | POA: Diagnosis present

## 2019-01-08 DIAGNOSIS — I10 Essential (primary) hypertension: Secondary | ICD-10-CM

## 2019-01-08 DIAGNOSIS — Z86718 Personal history of other venous thrombosis and embolism: Secondary | ICD-10-CM

## 2019-01-08 DIAGNOSIS — N183 Chronic kidney disease, stage 3 unspecified: Secondary | ICD-10-CM | POA: Diagnosis present

## 2019-01-08 DIAGNOSIS — Z66 Do not resuscitate: Secondary | ICD-10-CM | POA: Diagnosis present

## 2019-01-08 DIAGNOSIS — K219 Gastro-esophageal reflux disease without esophagitis: Secondary | ICD-10-CM | POA: Diagnosis present

## 2019-01-08 DIAGNOSIS — Z8249 Family history of ischemic heart disease and other diseases of the circulatory system: Secondary | ICD-10-CM

## 2019-01-08 DIAGNOSIS — Z833 Family history of diabetes mellitus: Secondary | ICD-10-CM

## 2019-01-08 DIAGNOSIS — I482 Chronic atrial fibrillation, unspecified: Secondary | ICD-10-CM | POA: Diagnosis present

## 2019-01-08 DIAGNOSIS — Z79899 Other long term (current) drug therapy: Secondary | ICD-10-CM

## 2019-01-08 DIAGNOSIS — R739 Hyperglycemia, unspecified: Secondary | ICD-10-CM

## 2019-01-08 DIAGNOSIS — I2510 Atherosclerotic heart disease of native coronary artery without angina pectoris: Secondary | ICD-10-CM | POA: Diagnosis present

## 2019-01-08 DIAGNOSIS — E1165 Type 2 diabetes mellitus with hyperglycemia: Secondary | ICD-10-CM | POA: Diagnosis present

## 2019-01-08 DIAGNOSIS — R110 Nausea: Secondary | ICD-10-CM

## 2019-01-08 DIAGNOSIS — I129 Hypertensive chronic kidney disease with stage 1 through stage 4 chronic kidney disease, or unspecified chronic kidney disease: Secondary | ICD-10-CM | POA: Diagnosis present

## 2019-01-08 DIAGNOSIS — Z95810 Presence of automatic (implantable) cardiac defibrillator: Secondary | ICD-10-CM

## 2019-01-08 DIAGNOSIS — Z955 Presence of coronary angioplasty implant and graft: Secondary | ICD-10-CM

## 2019-01-08 DIAGNOSIS — I252 Old myocardial infarction: Secondary | ICD-10-CM

## 2019-01-08 DIAGNOSIS — R112 Nausea with vomiting, unspecified: Secondary | ICD-10-CM

## 2019-01-08 DIAGNOSIS — Z8349 Family history of other endocrine, nutritional and metabolic diseases: Secondary | ICD-10-CM

## 2019-01-08 DIAGNOSIS — Z91040 Latex allergy status: Secondary | ICD-10-CM

## 2019-01-08 DIAGNOSIS — J1289 Other viral pneumonia: Secondary | ICD-10-CM | POA: Diagnosis present

## 2019-01-08 DIAGNOSIS — Z91041 Radiographic dye allergy status: Secondary | ICD-10-CM

## 2019-01-08 DIAGNOSIS — U071 COVID-19: Secondary | ICD-10-CM | POA: Diagnosis present

## 2019-01-08 DIAGNOSIS — T380X5A Adverse effect of glucocorticoids and synthetic analogues, initial encounter: Secondary | ICD-10-CM | POA: Diagnosis present

## 2019-01-08 DIAGNOSIS — E7800 Pure hypercholesterolemia, unspecified: Secondary | ICD-10-CM | POA: Diagnosis present

## 2019-01-08 DIAGNOSIS — E785 Hyperlipidemia, unspecified: Secondary | ICD-10-CM | POA: Diagnosis present

## 2019-01-08 DIAGNOSIS — H3530 Unspecified macular degeneration: Secondary | ICD-10-CM | POA: Diagnosis present

## 2019-01-08 DIAGNOSIS — F411 Generalized anxiety disorder: Secondary | ICD-10-CM | POA: Diagnosis present

## 2019-01-08 DIAGNOSIS — Z888 Allergy status to other drugs, medicaments and biological substances status: Secondary | ICD-10-CM

## 2019-01-08 DIAGNOSIS — R109 Unspecified abdominal pain: Secondary | ICD-10-CM

## 2019-01-08 DIAGNOSIS — Z91048 Other nonmedicinal substance allergy status: Secondary | ICD-10-CM

## 2019-01-08 DIAGNOSIS — K6289 Other specified diseases of anus and rectum: Principal | ICD-10-CM | POA: Diagnosis present

## 2019-01-08 DIAGNOSIS — Z7901 Long term (current) use of anticoagulants: Secondary | ICD-10-CM

## 2019-01-08 DIAGNOSIS — K589 Irritable bowel syndrome without diarrhea: Secondary | ICD-10-CM | POA: Diagnosis present

## 2019-01-08 LAB — URINALYSIS, ROUTINE W REFLEX MICROSCOPIC
Bilirubin Urine: NEGATIVE
Glucose, UA: NEGATIVE mg/dL
Hgb urine dipstick: NEGATIVE
Ketones, ur: NEGATIVE mg/dL
Leukocytes,Ua: NEGATIVE
Nitrite: NEGATIVE
Protein, ur: NEGATIVE mg/dL
Specific Gravity, Urine: 1.004 — ABNORMAL LOW (ref 1.005–1.030)
pH: 7 (ref 5.0–8.0)

## 2019-01-08 LAB — CBC WITH DIFFERENTIAL/PLATELET
Abs Immature Granulocytes: 0.05 10*3/uL (ref 0.00–0.07)
Basophils Absolute: 0 10*3/uL (ref 0.0–0.1)
Basophils Relative: 0 %
Eosinophils Absolute: 0.2 10*3/uL (ref 0.0–0.5)
Eosinophils Relative: 3 %
HCT: 38.7 % (ref 36.0–46.0)
Hemoglobin: 12.2 g/dL (ref 12.0–15.0)
Immature Granulocytes: 1 %
Lymphocytes Relative: 15 %
Lymphs Abs: 1.1 10*3/uL (ref 0.7–4.0)
MCH: 30 pg (ref 26.0–34.0)
MCHC: 31.5 g/dL (ref 30.0–36.0)
MCV: 95.1 fL (ref 80.0–100.0)
Monocytes Absolute: 0.5 10*3/uL (ref 0.1–1.0)
Monocytes Relative: 7 %
Neutro Abs: 5.7 10*3/uL (ref 1.7–7.7)
Neutrophils Relative %: 74 %
Platelets: 199 10*3/uL (ref 150–400)
RBC: 4.07 MIL/uL (ref 3.87–5.11)
RDW: 13.2 % (ref 11.5–15.5)
WBC: 7.7 10*3/uL (ref 4.0–10.5)
nRBC: 0 % (ref 0.0–0.2)

## 2019-01-08 LAB — COMPREHENSIVE METABOLIC PANEL
ALT: 25 U/L (ref 0–44)
AST: 20 U/L (ref 15–41)
Albumin: 3.1 g/dL — ABNORMAL LOW (ref 3.5–5.0)
Alkaline Phosphatase: 55 U/L (ref 38–126)
Anion gap: 12 (ref 5–15)
BUN: 22 mg/dL (ref 8–23)
CO2: 27 mmol/L (ref 22–32)
Calcium: 8.5 mg/dL — ABNORMAL LOW (ref 8.9–10.3)
Chloride: 98 mmol/L (ref 98–111)
Creatinine, Ser: 1.11 mg/dL — ABNORMAL HIGH (ref 0.44–1.00)
GFR calc Af Amer: 52 mL/min — ABNORMAL LOW (ref >60)
GFR calc non Af Amer: 45 mL/min — ABNORMAL LOW (ref >60)
Glucose, Bld: 197 mg/dL — ABNORMAL HIGH (ref 70–99)
Potassium: 3.9 mmol/L (ref 3.5–5.1)
Sodium: 137 mmol/L (ref 135–145)
Total Bilirubin: 1.1 mg/dL (ref 0.3–1.2)
Total Protein: 5.8 g/dL — ABNORMAL LOW (ref 6.5–8.1)

## 2019-01-08 LAB — TROPONIN I (HIGH SENSITIVITY)
Troponin I (High Sensitivity): 11 ng/L (ref <18)
Troponin I (High Sensitivity): 12 ng/L (ref <18)

## 2019-01-08 LAB — CULTURE, BLOOD (ROUTINE X 2)
Culture: NO GROWTH
Special Requests: ADEQUATE

## 2019-01-08 LAB — LIPASE, BLOOD: Lipase: 39 U/L (ref 11–51)

## 2019-01-08 MED ORDER — IOHEXOL 300 MG/ML  SOLN
75.0000 mL | Freq: Once | INTRAMUSCULAR | Status: AC | PRN
  Administered 2019-01-08: 75 mL via INTRAVENOUS

## 2019-01-08 NOTE — ED Triage Notes (Signed)
Pt arrives via [facility] c/o hyperglycemia and abd pain. Just seen at [facility] for the same on 12/21. Pt COVID positive since 12/3.

## 2019-01-08 NOTE — ED Provider Notes (Signed)
[facility] EMERGENCY DEPARTMENT Provider Note   CSN: [id number] Arrival date & time: 01/08/19  2003     History Chief Complaint  Patient presents with  . Abdominal Pain    [name] is a 85 y.o. female.  HPI      [name] is a 85 y.o. female with past medical history of atrial fibrillation has an AICD, CKD, hypertension and diverticulitis who presents to the Emergency Department complaining of diffuse lower abdominal pain since yesterday.  She describes constant aching pain to her lower abdomen with left side greater than right.  Pain is associated with a burning sensation as well that she states radiates into her vagina and rectum.  She reports having a small stool earlier today that was dark brown to black in color.  Earlier today, she states that she was having some chest pain that resolved after taking sublingual nitroglycerin.  Her chest pain is not associated with shortness of breath, diaphoresis or extremity pain.  She was seen at [facility] two days ago and treated for hyperglycemia.  She was diagnosed with COVID in early December states that she feels like this is improved.    Past Medical History:  Diagnosis Date  . AICD (automatic cardioverter/defibrillator) present   . Allergy    SESONAL  . Anxiety   . ARTHRITIS   . Arthritis   . Atrial fibrillation ([place])   . CAD (coronary artery disease)    a. history of cardiac arrest 1993;  b. s/p LAD/LCX stenting in 2003;  c. 07/2011 Cath: LM nl, LAD patent stent, D1 80ost, LCX patent stent, RCA min irregs;  d. 04/2012 MV: EF 66%, no ishcemia;  e. 12/2013 Echo: Ef 55%, no rwma, Gr 1 DD, triv AI/MR, mildly dil LA;  f. 12/2013 Lexi MV: intermediate risk - apical ischemia and inf/infsept fixed defect, ? artifactual.  . CAD in native artery 12/31/2013   Previous stents to LAD and Ramus patent on cath today 12/31/13 also There is severe disease in the ostial first diagonal which is unchanged from most recent cardiac  catheterization. The right coronary artery could not be engaged selectively but nonselective angiography showed no significant disease in the proximal and midsegment.     . Cataract    DENIES  . Chronic back pain   . Chronic sinus bradycardia   . CKD (chronic kidney disease), stage III   . Clotting disorder (HCC)    DVT  . COLITIS 12/02/2007   Qualifier: Diagnosis of  By: Nils Pyle CMA ([place]), [name]    . Diabetes mellitus without complication ([place])    DENIES  . Diverticulosis of colon (without mention of hemorrhage) 2007   Colonoscopy   . Esophageal stricture    a. 2012 s/p dil.  . Esophagitis, unspecified    a. 2012 EGD  . EXTERNAL HEMORRHOIDS   . GERD (gastroesophageal reflux disease)    omeprazole  . H/O hiatal hernia   . Hiatal hernia    a. 2012 EGD.  Marland Kitchen History of DVT in the past, not on Coumadin now    left  leg  . HYPERCHOLESTEROLEMIA   . Hypertension   . ICD (implantable cardiac defibrillator) in place    a. s/p initial ICD in 1993 in setting of cardiac arrest;  b. 01/2007 gen change: Guidant T135 Vitality DS VR single lead ICD.  . Macular degeneration    gets injecton in eye every 5 weeks- last injection - 05/03/2013   . Myocardial infarction ([place])  1993  . Near syncope 05/22/2015  . Nephrolithiasis, just saw Dr [name]   . Orthostatic hypotension   . Peripheral vascular disease ([place])    ???  . RECTAL BLEEDING 12/03/2007   Qualifier: Diagnosis of  By: Sharlett Iles MD Byrd Hesselbach   . Unspecified gastritis and gastroduodenitis without mention of hemorrhage    a. 2003 EGD->not noted on 2012 EGD.  Marland Kitchen Unstable angina, neg MI, cath stable.maybe GI 12/30/2013    Patient Active Problem List   Diagnosis Date Noted  . Pneumonia due to COVID-19 virus 12/28/2018  . Chronic diarrhea 09/18/2018  . Rectal pain 09/18/2018  . Osteoarthritis of right knee 03/29/2018  . Gastroesophageal reflux disease   . Diarrhea of presumed infectious origin 02/06/2018  . Hypertension  associated with diabetes ([place]) 12/21/2017  . Degenerative scoliosis 02/19/2017  . Burst fracture of lumbar vertebra ([place]) 02/19/2017  . History of drug-induced prolonged QT interval with torsade de pointes 11/01/2016  . Iron deficiency anemia 10/30/2016  . Long term current use of anticoagulant 02/17/2016  . Near syncope 05/22/2015  . Diabetes mellitus type 2, diet-controlled ([place]) 09/14/2014  . ICD (implantable cardioverter-defibrillator) battery depletion 01/20/2014  . Abnormal nuclear stress test, 12/30/13 12/31/2013  . Coronary artery disease involving native coronary artery of native heart without angina pectoris 12/31/2013  . Paroxysmal atrial fibrillation, chads2 Vasc2 score of 5, on eliquis 10/16/2013  . Catecholaminergic polymorphic ventricular tachycardia ([place]) 10/16/2013  . Ureteral stone with hydronephrosis 05/20/2013  . Metabolic syndrome [id number]  . Orthostatic hypotension 07/27/2011  . Chest pain, r/o cardiac, negative MI 07/25/2011  . Weakness 07/25/2011    Class: Acute  . Chronic sinus bradycardia 07/25/2011  . ICD (implantable cardioverter-defibrillator) in place 07/25/2011  . CKD (chronic kidney disease) stage 3, GFR 30-59 ml/min (HCC) 07/25/2011    Class: Chronic  . History of DVT in the past, on eliquis now 07/25/2011    Class: History of  . Nephrolithiasis, just saw Dr [name] 07/25/2011    Class: History of  . DYSPHAGIA 03/24/2010  . Chronic ischemic heart disease 12/03/2007  . Irritable bowel syndrome with diarrhea 12/03/2007  . EPIGASTRIC PAIN 12/03/2007  . Hyperlipidemia associated with type 2 diabetes mellitus ([place]) 12/02/2007    Class: History of  . EXTERNAL HEMORRHOIDS 12/02/2007    Class: History of  . ESOPHAGITIS 12/02/2007  . GASTRITIS 12/02/2007  . DIVERTICULOSIS, COLON 12/02/2007  . ARTHRITIS 12/02/2007    Class: Chronic    Past Surgical History:  Procedure Laterality Date  . BREAST BIOPSY Left 11/2013  . CARDIAC CATHETERIZATION      . CARDIAC CATHETERIZATION  01/01/15   patent stents -there is severe disease in ostial 1st diag but without change.   Marland Kitchen CARDIAC DEFIBRILLATOR PLACEMENT  02/05/2007   Guidant  . CARDIOVASCULAR STRESS TEST  12/30/2013   abnormal  . COLONOSCOPY    . coronary stents     . CYSTOSCOPY WITH URETEROSCOPY Right 05/20/2013   Procedure: CYSTOSCOPY, RIGHT URETEROSCOPY STONE EXTRACTION, Insertion of right DOUBLE J STENT ;  Surgeon: Irine Seal, MD;  Location: [facility];  Service: Urology;  Laterality: Right;  . CYSTOSCOPY WITH URETEROSCOPY AND STENT PLACEMENT N/A 06/03/2013   Procedure: SECOND LOOK CYSTOSCOPY WITH URETEROSCOPY  HOLMIUM LASER LITHO AND STONE EXTRACTION Sammie Bench ;  Surgeon: Malka So, MD;  Location: [facility];  Service: Urology;  Laterality: N/A;  . GIVENS CAPSULE STUDY N/A 10/30/2016   Procedure: GIVENS CAPSULE STUDY;  Surgeon: Irene Shipper, MD;  Location: [facility];  Service: Endoscopy;  Laterality: N/A;  NEEDS TO BE ADMITTED FOR OBSERVATION PT. HAS DEFIB  . HEMORROIDECTOMY  80's  . HOLMIUM LASER APPLICATION Right [id number]   Procedure: HOLMIUM LASER APPLICATION;  Surgeon: Irine Seal, MD;  Location: [facility];  Service: Urology;  Laterality: Right;  . HYSTEROSCOPY WITH D & C  09/02/2010   Procedure: DILATATION AND CURETTAGE (D&C) /HYSTEROSCOPY;  Surgeon: Margarette Asal;  Location: [facility];  Service: Gynecology;  Laterality: N/A;  Dilation and Curettage with Hysteroscopy and Polypectomy  . IMPLANTABLE CARDIOVERTER DEFIBRILLATOR (ICD) GENERATOR CHANGE N/A 02/10/2014   Procedure: ICD GENERATOR CHANGE;  Surgeon: Sanda Klein, MD;  Location: [facility] CATH LAB;  Service: Cardiovascular;  Laterality: N/A;  . LEFT HEART CATHETERIZATION WITH CORONARY ANGIOGRAM N/A 07/28/2011   Procedure: LEFT HEART CATHETERIZATION WITH CORONARY ANGIOGRAM;  Surgeon: Lorretta Harp, MD;  Location: [facility] CATH LAB;  Service: Cardiovascular;  Laterality: N/A;  . LEFT HEART CATHETERIZATION WITH CORONARY ANGIOGRAM N/A 12/31/2013    Procedure: LEFT HEART CATHETERIZATION WITH CORONARY ANGIOGRAM;  Surgeon: Wellington Hampshire, MD;  Location: [facility] CATH LAB;  Service: Cardiovascular;  Laterality: N/A;     OB History   No obstetric history on file.     Family History  Problem Relation Age of Onset  . Stroke Mother   . Other Mother        brain tumor  . Other Father        MI  . Heart attack Father   . Stroke Sister   . Macular degeneration Sister   . Diabetes Daughter   . Cancer Daughter        ovarian  . Colon polyps Daughter   . Atrial fibrillation Sister   . Hyperlipidemia Sister   . Osteoporosis Sister   . Stroke Sister   . Uterine cancer Sister   . Heart attack Brother   . Heart disease Brother   . Asthma Brother   . Breast cancer Neg Hx     Social History   Tobacco Use  . Smoking status: Never Smoker  . Smokeless tobacco: Never Used  Substance Use Topics  . Alcohol use: No  . Drug use: No    Home Medications Prior to Admission medications   Medication Sig Start Date End Date Taking? Authorizing Provider  Calcium Citrate-Vitamin D (CITRACAL + D PO) Take 1 tablet by mouth daily.    [provider]  Cholecalciferol (VITAMIN D) 1000 UNITS capsule Take 2,000 Units by mouth daily.     [provider]  diphenoxylate-atropine (LOMOTIL) 2.5-0.025 MG tablet Take 1 tablet by mouth every 8 (eight) hours as needed for diarrhea or loose stools. 05/16/18 05/16/19  Chipper Herb, MD  ELIQUIS 2.5 MG TABS tablet TAKE  (1)  TABLET TWICE A DAY. Patient taking differently: Take 2.5 mg by mouth 2 (two) times daily.  10/30/18   Croitoru, Mihai, MD  metoprolol tartrate (LOPRESSOR) 25 MG tablet TAKE  (1)  TABLET TWICE A DAY. Patient taking differently: Take 25 mg by mouth 2 (two) times daily.  11/12/18   Croitoru, Mihai, MD  mirtazapine (REMERON) 7.5 MG tablet Take 1 tablet (7.5 mg total) by mouth at bedtime. (Taper off of Paxil first) 12/26/18   [name], DO  Multiple Vitamins-Minerals  (CENTRUM SILVER ULTRA WOMENS PO) Take 1 tablet by mouth daily.    [provider]  nitroGLYCERIN (NITROSTAT) 0.4 MG SL tablet Place 1 tablet (0.4 mg total) under the tongue every  5 (five) minutes as needed for chest pain. 12/20/18   Claretta Fraise, MD  pantoprazole (PROTONIX) 40 MG tablet Take 40 mg by mouth daily. 09/30/18   [provider]  rosuvastatin (CRESTOR) 40 MG tablet TAKE (1/2) TABLET DAILY. Patient taking differently: Take 20 mg by mouth daily.  10/22/18   [name], DO  torsemide (DEMADEX) 20 MG tablet Take 20 mg by mouth daily as needed (edema).    [provider]  Vitamin D, Ergocalciferol, (DRISDOL) 1.25 MG (50000 UT) CAPS capsule Take 1 capsule (50,000 Units total) by mouth every 7 (seven) days. 12/20/18   [name], DO  amLODipine (NORVASC) 2.5 MG tablet Take 1 tablet (2.5 mg total) by mouth daily. 03/04/18 08/28/18  Croitoru, Mihai, MD    Allergies    Sotalol, Bactrim [sulfamethoxazole-trimethoprim], Ciprofloxacin, Actos [pioglitazone], Adhesive [tape], Contrast media [iodinated diagnostic agents], Latex, and Lipitor [atorvastatin]  Review of Systems   Review of Systems  Constitutional: Negative for appetite change, chills and fever.  Respiratory: Negative for cough and shortness of breath.   Cardiovascular: Positive for chest pain.  Gastrointestinal: Positive for abdominal pain, nausea and vomiting. Negative for blood in stool.  Genitourinary: Negative for decreased urine volume, difficulty urinating, dysuria and flank pain.  Musculoskeletal: Negative for back pain.  Skin: Negative for color change and rash.  Neurological: Negative for dizziness, facial asymmetry, weakness, numbness and headaches.  Hematological: Negative for adenopathy.    Physical Exam Updated Vital Signs BP (!) 165/94 (BP Location: Left Arm)   Pulse 82   Temp 98.3 F (36.8 C) (Oral)   Resp 18   Ht 5\' 6"  (1.676 m)   Wt 63.5 kg   SpO2 99%   BMI 22.60  kg/m   Physical Exam Vitals and nursing note reviewed.  Constitutional:      General: She is not in acute distress.    Appearance: She is well-developed. She is not ill-appearing.  HENT:     Mouth/Throat:     Mouth: Mucous membranes are moist.  Cardiovascular:     Rate and Rhythm: Normal rate. Rhythm irregular.  Pulmonary:     Effort: Pulmonary effort is normal. No respiratory distress.     Breath sounds: Normal breath sounds. No wheezing or rhonchi.  Chest:     Chest wall: No tenderness.  Abdominal:     General: Abdomen is flat.     Palpations: Abdomen is soft.     Tenderness: There is abdominal tenderness in the right lower quadrant and left lower quadrant. There is no right CVA tenderness or left CVA tenderness.  Genitourinary:    Rectum: No mass or tenderness. Normal anal tone.     Comments: Attempted DRE, no stool present in the rectal vault.  Small external non-thrombosed hemorrhoid present.  No palpable rectal masses.   Musculoskeletal:        General: Normal range of motion.     Right lower leg: Edema present.     Left lower leg: Edema present.  Skin:    General: Skin is warm.     Capillary Refill: Capillary refill takes less than 2 seconds.     Findings: No erythema or rash.  Neurological:     General: No focal deficit present.     Mental Status: She is alert.     Sensory: Sensation is intact.     Motor: Motor function is intact.     Coordination: Coordination is intact.     Comments: CN II-XII intact.  Speech clear.  Grip strength is symmetrical.       ED Results / Procedures / Treatments   Labs (all labs ordered are listed, but only abnormal results are displayed) Labs Reviewed  COMPREHENSIVE METABOLIC PANEL - Abnormal; Notable for the following components:      Result Value   Glucose, Bld 197 (*)    Creatinine, Ser 1.11 (*)    Calcium 8.5 (*)    Total Protein 5.8 (*)    Albumin 3.1 (*)    GFR calc non Af Amer 45 (*)    GFR calc Af Amer 52 (*)    All  other components within normal limits  URINALYSIS, ROUTINE W REFLEX MICROSCOPIC - Abnormal; Notable for the following components:   Color, Urine STRAW (*)    Specific Gravity, Urine 1.004 (*)    All other components within normal limits  LIPASE, BLOOD  CBC WITH DIFFERENTIAL/PLATELET  TROPONIN I (HIGH SENSITIVITY)  TROPONIN I (HIGH SENSITIVITY)    EKG EKG Interpretation  Date/Time:  Wednesday January 08 2019 20:59:02 EST Ventricular Rate:  100 PR Interval:    QRS Duration: 125 QT Interval:  395 QTC Calculation: 476 R Axis:   -59 Text Interpretation: Atrial fibrillation Left bundle branch block Confirmed by Veryl Speak [phone]) on 01/08/2019 9:07:40 PM   Radiology CT ABDOMEN PELVIS W CONTRAST  Result Date: 01/08/2019 CLINICAL DATA:  Diverticulitis suspected, hyperglycemia and abdominal pain, COVID positive 12/19/2018 EXAM: CT ABDOMEN AND PELVIS WITH CONTRAST TECHNIQUE: Multidetector CT imaging of the abdomen and pelvis was performed using the standard protocol following bolus administration of intravenous contrast. CONTRAST:  39mL OMNIPAQUE IOHEXOL 300 MG/ML  SOLN COMPARISON:  CT 12/19/2018 FINDINGS: Lower chest: Mixed reticular and patchy ground-glass seen in the periphery of both lung bases compatible with reported history of COVID-19 cardiomegaly with four-chamber enlargement extensive coronary artery calcifications. Multiple apical pacing and defibrillator leads are noted within the heart. No pericardial effusion. Hepatobiliary: No focal liver abnormality is seen. No calcified gallstones or pericholecystic inflammation. Focal thickening at the gallbladder fundus has an appearance most suggestive of adenomyomatosis. No biliary ductal dilatation. Pancreas: Unremarkable. No pancreatic ductal dilatation or surrounding inflammatory changes. Spleen: Normal in size without focal abnormality. Adrenals/Urinary Tract: Normal in adrenal glands without focal worrisome adrenal lesion. Asymmetric  atrophy of the left kidney. Kidneys however enhance and excrete symmetrically. Few punctate collecting system calcifications without evidence of obstructive urolithiasis or hydronephrosis. No worrisome renal lesions. Few subcentimeter hypoattenuating foci in the right kidney are too small to fully characterize on CT imaging but statistically likely benign. Punctate focus of anti dependent gas seen within the urinary bladder. No frank wall thickening or perivesicular stranding. Stomach/Bowel: Distal esophagus, stomach and duodenal sweep are unremarkable. No small bowel wall thickening or dilatation. No evidence of obstruction. A normal appendix is visualized. No colonic dilatation or wall thickening. There is focal asymmetric rectal wall thickening and perirectal hazy stranding as well as irregular thickening along the mesial left gluteal cleft. Vascular/Lymphatic: Atherosclerotic plaque within the normal caliber aorta. No suspicious or enlarged lymph nodes in the included lymphatic chains. Reproductive: Uterus is surgically absent. No concerning adnexal lesions. Other: Mild body wall edema. No bowel containing hernia. Stable appearance of several curled metallic leads present in the left anterior abdominal wall. No free air or fluid in the abdomen or pelvis. Musculoskeletal: There is marked levocurvature of the lumbar spine centered at L2-3 extensive bridging osteophytosis is similar to comparison studies. There is a stable superior endplate compression deformity  at L1 and anterior wedging of T12. IMPRESSION: 1. No evidence of significant diverticular disease. 2. Focal asymmetric rectal wall thickening and perirectal hazy stranding as well as irregular thickening along the mesial left gluteal cleft. Findings could reflect proctitis and possible perianal abscess. Recommend correlation with direct visualization however to exclude underlying mass. 3. Punctate focus of anti dependent gas within the urinary bladder. If  there is no history of recent instrumentation, this could represent a gas-forming organism. If there is concern for infection, recommend correlation with urinalysis. 4. Mixed reticular and patchy ground-glass in the periphery of both lung bases compatible with reported history of COVID-19 5. Focal thickening of the gallbladder fundus without pericholecystic inflammation. Could reflect adenomyomatosis. Could be further evaluated nonemergent outpatient right upper quadrant ultrasound. 6. Asymmetric left renal atrophy. 7. Aortic Atherosclerosis (ICD10-I70.0). Cardiomegaly with extensive coronary artery calcifications. 8. Stable appearance of several curled metallic leads in the left anterior abdominal wall. Correlate with procedural history. 9. Mild body wall edema. 10. Stable superior endplate compression deformity at L1 and anterior wedging of T12. Electronically Signed   By: [name] M.D.   On: 01/08/2019 23:40   DG Chest Portable 1 View  Result Date: 01/08/2019 CLINICAL DATA:  85 year old female with weakness. EXAM: PORTABLE CHEST 1 VIEW COMPARISON:  Chest radiograph dated 12/28/2018. FINDINGS: Bilateral peripheral and subpleural predominant hazy airspace densities similar or slightly progressed since the radiograph of 12/28/2018 most consistent with multifocal pneumonia, likely viral or atypical in etiology including COVID-19. There is no pleural effusion or pneumothorax. Stable cardiac silhouette. Atherosclerotic calcification of the aorta. Left pectoral AICD device. No acute osseous pathology. IMPRESSION: Multifocal pneumonia similar or slightly progressed. Continued follow-up recommended. Electronically Signed   By: Anner Crete M.D.   On: 01/08/2019 21:28    Procedures Procedures (including critical care time)  Medications Ordered in ED Medications  iohexol (OMNIPAQUE) 300 MG/ML solution 75 mL (75 mLs Intravenous Contrast Given 01/08/19 2301)  piperacillin-tazobactam (ZOSYN) IVPB 3.375 g  (0 g Intravenous Stopped 01/09/19 0133)  acetaminophen (TYLENOL) tablet 650 mg (650 mg Oral Given 01/09/19 0138)    ED Course  I have reviewed the triage vital signs and the nursing notes.  Pertinent labs & imaging results that were available during my care of the patient were reviewed by me and considered in my medical decision making (see chart for details).    MDM Rules/Calculators/A&P                      Patient with diffuse lower abdominal pain and burning sensation to her rectal area.  No perirectal induration noted on digital exam.  No palpable rectal masses.  CT abdomen pelvis shows possible proctitis and  gas within the urinary bladder. Origin of gas within the bladder is unclear.  Patient started on antibiotics, she is nontoxic-appearing, no hypotension and she is afebrile.  Will consult hospitalist for admission.   [facility] hospitalist who agrees to admit.    Final Clinical Impression(s) / ED Diagnoses Final diagnoses:  Proctitis    Rx / DC Orders ED Discharge Orders    None       Kem Parkinson, PA-C 01/09/19 0155    Ripley Fraise, MD 01/09/19 [phone]

## 2019-01-09 DIAGNOSIS — F411 Generalized anxiety disorder: Secondary | ICD-10-CM | POA: Diagnosis present

## 2019-01-09 DIAGNOSIS — R112 Nausea with vomiting, unspecified: Secondary | ICD-10-CM

## 2019-01-09 DIAGNOSIS — U071 COVID-19: Secondary | ICD-10-CM | POA: Diagnosis present

## 2019-01-09 DIAGNOSIS — E1165 Type 2 diabetes mellitus with hyperglycemia: Secondary | ICD-10-CM | POA: Diagnosis present

## 2019-01-09 DIAGNOSIS — K219 Gastro-esophageal reflux disease without esophagitis: Secondary | ICD-10-CM

## 2019-01-09 DIAGNOSIS — Z86718 Personal history of other venous thrombosis and embolism: Secondary | ICD-10-CM | POA: Diagnosis not present

## 2019-01-09 DIAGNOSIS — J1289 Other viral pneumonia: Secondary | ICD-10-CM | POA: Diagnosis present

## 2019-01-09 DIAGNOSIS — E785 Hyperlipidemia, unspecified: Secondary | ICD-10-CM

## 2019-01-09 DIAGNOSIS — I10 Essential (primary) hypertension: Secondary | ICD-10-CM

## 2019-01-09 DIAGNOSIS — K589 Irritable bowel syndrome without diarrhea: Secondary | ICD-10-CM | POA: Diagnosis present

## 2019-01-09 DIAGNOSIS — R1030 Lower abdominal pain, unspecified: Secondary | ICD-10-CM

## 2019-01-09 DIAGNOSIS — R739 Hyperglycemia, unspecified: Secondary | ICD-10-CM

## 2019-01-09 DIAGNOSIS — Z95810 Presence of automatic (implantable) cardiac defibrillator: Secondary | ICD-10-CM | POA: Diagnosis not present

## 2019-01-09 DIAGNOSIS — Z8249 Family history of ischemic heart disease and other diseases of the circulatory system: Secondary | ICD-10-CM | POA: Diagnosis not present

## 2019-01-09 DIAGNOSIS — I129 Hypertensive chronic kidney disease with stage 1 through stage 4 chronic kidney disease, or unspecified chronic kidney disease: Secondary | ICD-10-CM | POA: Diagnosis present

## 2019-01-09 DIAGNOSIS — N183 Chronic kidney disease, stage 3 unspecified: Secondary | ICD-10-CM | POA: Diagnosis present

## 2019-01-09 DIAGNOSIS — T380X5A Adverse effect of glucocorticoids and synthetic analogues, initial encounter: Secondary | ICD-10-CM | POA: Diagnosis present

## 2019-01-09 DIAGNOSIS — Z91048 Other nonmedicinal substance allergy status: Secondary | ICD-10-CM | POA: Diagnosis not present

## 2019-01-09 DIAGNOSIS — H3530 Unspecified macular degeneration: Secondary | ICD-10-CM | POA: Diagnosis present

## 2019-01-09 DIAGNOSIS — I252 Old myocardial infarction: Secondary | ICD-10-CM | POA: Diagnosis not present

## 2019-01-09 DIAGNOSIS — K6289 Other specified diseases of anus and rectum: Secondary | ICD-10-CM | POA: Diagnosis present

## 2019-01-09 DIAGNOSIS — I482 Chronic atrial fibrillation, unspecified: Secondary | ICD-10-CM | POA: Diagnosis present

## 2019-01-09 DIAGNOSIS — E7800 Pure hypercholesterolemia, unspecified: Secondary | ICD-10-CM | POA: Diagnosis present

## 2019-01-09 DIAGNOSIS — R110 Nausea: Secondary | ICD-10-CM

## 2019-01-09 DIAGNOSIS — Z955 Presence of coronary angioplasty implant and graft: Secondary | ICD-10-CM | POA: Diagnosis not present

## 2019-01-09 DIAGNOSIS — Z66 Do not resuscitate: Secondary | ICD-10-CM | POA: Diagnosis present

## 2019-01-09 DIAGNOSIS — Z833 Family history of diabetes mellitus: Secondary | ICD-10-CM | POA: Diagnosis not present

## 2019-01-09 DIAGNOSIS — I447 Left bundle-branch block, unspecified: Secondary | ICD-10-CM | POA: Diagnosis present

## 2019-01-09 DIAGNOSIS — I2510 Atherosclerotic heart disease of native coronary artery without angina pectoris: Secondary | ICD-10-CM | POA: Diagnosis present

## 2019-01-09 LAB — COMPREHENSIVE METABOLIC PANEL
ALT: 21 U/L (ref 0–44)
AST: 17 U/L (ref 15–41)
Albumin: 2.9 g/dL — ABNORMAL LOW (ref 3.5–5.0)
Alkaline Phosphatase: 51 U/L (ref 38–126)
Anion gap: 11 (ref 5–15)
BUN: 18 mg/dL (ref 8–23)
CO2: 28 mmol/L (ref 22–32)
Calcium: 8.3 mg/dL — ABNORMAL LOW (ref 8.9–10.3)
Chloride: 98 mmol/L (ref 98–111)
Creatinine, Ser: 1.06 mg/dL — ABNORMAL HIGH (ref 0.44–1.00)
GFR calc Af Amer: 55 mL/min — ABNORMAL LOW (ref >60)
GFR calc non Af Amer: 48 mL/min — ABNORMAL LOW (ref >60)
Glucose, Bld: 165 mg/dL — ABNORMAL HIGH (ref 70–99)
Potassium: 3.6 mmol/L (ref 3.5–5.1)
Sodium: 137 mmol/L (ref 135–145)
Total Bilirubin: 1.2 mg/dL (ref 0.3–1.2)
Total Protein: 5.5 g/dL — ABNORMAL LOW (ref 6.5–8.1)

## 2019-01-09 LAB — CBC
HCT: 37.5 % (ref 36.0–46.0)
Hemoglobin: 11.9 g/dL — ABNORMAL LOW (ref 12.0–15.0)
MCH: 30.3 pg (ref 26.0–34.0)
MCHC: 31.7 g/dL (ref 30.0–36.0)
MCV: 95.4 fL (ref 80.0–100.0)
Platelets: 179 10*3/uL (ref 150–400)
RBC: 3.93 MIL/uL (ref 3.87–5.11)
RDW: 13.2 % (ref 11.5–15.5)
WBC: 7.6 10*3/uL (ref 4.0–10.5)
nRBC: 0 % (ref 0.0–0.2)

## 2019-01-09 LAB — APTT: aPTT: 25 seconds (ref 24–36)

## 2019-01-09 LAB — URINE CULTURE: Culture: 80000 — AB

## 2019-01-09 LAB — MAGNESIUM: Magnesium: 1.9 mg/dL (ref 1.7–2.4)

## 2019-01-09 LAB — PHOSPHORUS: Phosphorus: 3.3 mg/dL (ref 2.5–4.6)

## 2019-01-09 LAB — PROTIME-INR
INR: 1.2 (ref 0.8–1.2)
Prothrombin Time: 15 seconds (ref 11.4–15.2)

## 2019-01-09 MED ORDER — PIPERACILLIN-TAZOBACTAM 3.375 G IVPB
3.3750 g | Freq: Three times a day (TID) | INTRAVENOUS | Status: DC
  Administered 2019-01-09 – 2019-01-12 (×10): 3.375 g via INTRAVENOUS
  Filled 2019-01-09 (×11): qty 50

## 2019-01-09 MED ORDER — MIRTAZAPINE 15 MG PO TABS
7.5000 mg | ORAL_TABLET | Freq: Every day | ORAL | Status: DC
  Administered 2019-01-09 – 2019-01-11 (×3): 7.5 mg via ORAL
  Filled 2019-01-09 (×3): qty 1

## 2019-01-09 MED ORDER — ONDANSETRON HCL 4 MG/2ML IJ SOLN: 4.0000 mg | Freq: Four times a day (QID) | INTRAMUSCULAR | Status: DC | PRN

## 2019-01-09 MED ORDER — ACETAMINOPHEN 650 MG RE SUPP: 650.0000 mg | Freq: Four times a day (QID) | RECTAL | Status: DC | PRN

## 2019-01-09 MED ORDER — MORPHINE SULFATE (PF) 2 MG/ML IV SOLN
2.0000 mg | INTRAVENOUS | Status: DC | PRN
  Filled 2019-01-09: qty 1

## 2019-01-09 MED ORDER — PANTOPRAZOLE SODIUM 40 MG PO TBEC
40.0000 mg | DELAYED_RELEASE_TABLET | Freq: Every day | ORAL | Status: DC
  Administered 2019-01-09 – 2019-01-12 (×4): 40 mg via ORAL
  Filled 2019-01-09 (×4): qty 1

## 2019-01-09 MED ORDER — ACETAMINOPHEN 325 MG PO TABS
650.0000 mg | ORAL_TABLET | Freq: Four times a day (QID) | ORAL | Status: DC | PRN
  Administered 2019-01-09 – 2019-01-10 (×4): 650 mg via ORAL
  Filled 2019-01-09 (×4): qty 2

## 2019-01-09 MED ORDER — METOPROLOL TARTRATE 25 MG PO TABS
25.0000 mg | ORAL_TABLET | Freq: Two times a day (BID) | ORAL | Status: DC
  Administered 2019-01-09 – 2019-01-12 (×6): 25 mg via ORAL
  Filled 2019-01-09 (×6): qty 1

## 2019-01-09 MED ORDER — ROSUVASTATIN CALCIUM 20 MG PO TABS
20.0000 mg | ORAL_TABLET | Freq: Every day | ORAL | Status: DC
  Administered 2019-01-09 – 2019-01-12 (×4): 20 mg via ORAL
  Filled 2019-01-09 (×4): qty 1

## 2019-01-09 MED ORDER — PIPERACILLIN-TAZOBACTAM 3.375 G IVPB 30 MIN
3.3750 g | Freq: Once | INTRAVENOUS | Status: AC
  Administered 2019-01-09: 01:00:00 3.375 g via INTRAVENOUS
  Filled 2019-01-09: qty 50

## 2019-01-09 MED ORDER — ACETAMINOPHEN 325 MG PO TABS
650.0000 mg | ORAL_TABLET | Freq: Once | ORAL | Status: AC
  Administered 2019-01-09: 650 mg via ORAL
  Filled 2019-01-09: qty 2

## 2019-01-09 MED ORDER — APIXABAN 2.5 MG PO TABS
2.5000 mg | ORAL_TABLET | Freq: Two times a day (BID) | ORAL | Status: DC
  Administered 2019-01-09 – 2019-01-12 (×6): 2.5 mg via ORAL
  Filled 2019-01-09 (×6): qty 1

## 2019-01-09 NOTE — ED Notes (Signed)
Pt given breakfast tray

## 2019-01-09 NOTE — ED Notes (Signed)
MD at bedside. 

## 2019-01-09 NOTE — H&P (Signed)
History and Physical  LAMIYAH RIEDY [id number] DOB: 1931-06-09 DOA: 01/08/2019  Referring physician: Kem Parkinson PCP: [name], DO  Patient coming from: Home  Chief Complaint: Abdominal pain  HPI: CHATHERINE PLANAS is an 85 y.o. female with medical history significant for hypertension, atrial fibrillation on Eliquis, AICD and CKD stage III who presents to the emergency department due to diffuse lower abdominal pain which started within 24 hours of presents to the ED.  Pain was constant and was in lower abdominal area (worse in LLQ  Vs RLQ) with radiation to rectum and vagina.  abdominal pain was associated with nausea and vomiting.  Stool prior to arrival was dark brown/black in color.  She complained of chest pain that resolved with sublingual nitroglycerin, she was recently treated for hyperglycemia at [facility] about 3 days ago (01/06/19).  Of note, patient was diagnosed with Covid 19 on December 3rd and was placed on steroids. She currently endorsed improvement of her symptoms.  ED Course:  In the emergency department, BP was 165/94 and other vital signs are within normal range.  Work-up in the ED showed normal CBC and BMP except for hyperglycemia.  Troponin x2 were negative.  Urine culture showed 80,000 colonies of Enterococcus faecalis.  CT abdomen pelvis with contrast showed no evidence of significant vascular disease but showed findings suspicious for proctitis and possible perianal abscess..  Chest x-ray showed multifocal pneumonia similar or slightly progressed.  She was treated with Tylenol and IV Zosyn.  Hospitalist was asked to admit patient for further evaluation and management.  Review of Systems: Constitutional: Negative for chills and fever.  HENT: Negative for ear pain and sore throat.   Eyes: Negative for pain and visual disturbance.  Respiratory: Negative for cough, chest tightness and shortness of breath.   Cardiovascular: Positive for chest pain  (resolved).negative for palpitations.  Gastrointestinal: Positive for  abdominal pain, nausea and vomiting.    Endocrine: Negative for polyphagia and polyuria.  Genitourinary: Negative for decreased urine volume, dysuria, enuresis Musculoskeletal: Negative for arthralgias and back pain.  Skin: Negative for color change and rash.  Allergic/Immunologic: Negative for immunocompromised state.  Neurological: Negative for tremors, syncope, speech difficulty, weakness, light-headedness and headaches.  Hematological: Does not bruise/bleed easily.  Review of systems as noted in the HPI. All other systems reviewed and are negative.   Past Medical History:  Diagnosis Date  . AICD (automatic cardioverter/defibrillator) present   . Allergy    SESONAL  . Anxiety   . ARTHRITIS   . Arthritis   . Atrial fibrillation ([place])   . CAD (coronary artery disease)    a. history of cardiac arrest 1993;  b. s/p LAD/LCX stenting in 2003;  c. 07/2011 Cath: LM nl, LAD patent stent, D1 80ost, LCX patent stent, RCA min irregs;  d. 04/2012 MV: EF 66%, no ishcemia;  e. 12/2013 Echo: Ef 55%, no rwma, Gr 1 DD, triv AI/MR, mildly dil LA;  f. 12/2013 Lexi MV: intermediate risk - apical ischemia and inf/infsept fixed defect, ? artifactual.  . CAD in native artery 12/31/2013   Previous stents to LAD and Ramus patent on cath today 12/31/13 also There is severe disease in the ostial first diagonal which is unchanged from most recent cardiac catheterization. The right coronary artery could not be engaged selectively but nonselective angiography showed no significant disease in the proximal and midsegment.     . Cataract    DENIES  . Chronic back pain   . Chronic sinus  bradycardia   . CKD (chronic kidney disease), stage III   . Clotting disorder (HCC)    DVT  . COLITIS 12/02/2007   Qualifier: Diagnosis of  By: Nils Pyle CMA ([place]), [name]    . Diabetes mellitus without complication ([place])    DENIES  . Diverticulosis of colon  (without mention of hemorrhage) 2007   Colonoscopy   . Esophageal stricture    a. 2012 s/p dil.  . Esophagitis, unspecified    a. 2012 EGD  . EXTERNAL HEMORRHOIDS   . GERD (gastroesophageal reflux disease)    omeprazole  . H/O hiatal hernia   . Hiatal hernia    a. 2012 EGD.  Marland Kitchen History of DVT in the past, not on Coumadin now    left  leg  . HYPERCHOLESTEROLEMIA   . Hypertension   . ICD (implantable cardiac defibrillator) in place    a. s/p initial ICD in 1993 in setting of cardiac arrest;  b. 01/2007 gen change: Guidant T135 Vitality DS VR single lead ICD.  . Macular degeneration    gets injecton in eye every 5 weeks- last injection - 05/03/2013   . Myocardial infarction ([place]) 1993  . Near syncope 05/22/2015  . Nephrolithiasis, just saw Dr [name]   . Orthostatic hypotension   . Peripheral vascular disease ([place])    ???  . RECTAL BLEEDING 12/03/2007   Qualifier: Diagnosis of  By: Sharlett Iles MD Byrd Hesselbach   . Unspecified gastritis and gastroduodenitis without mention of hemorrhage    a. 2003 EGD->not noted on 2012 EGD.  Marland Kitchen Unstable angina, neg MI, cath stable.maybe GI 12/30/2013   Past Surgical History:  Procedure Laterality Date  . BREAST BIOPSY Left 11/2013  . CARDIAC CATHETERIZATION    . CARDIAC CATHETERIZATION  01/01/15   patent stents -there is severe disease in ostial 1st diag but without change.   Marland Kitchen CARDIAC DEFIBRILLATOR PLACEMENT  02/05/2007   Guidant  . CARDIOVASCULAR STRESS TEST  12/30/2013   abnormal  . COLONOSCOPY    . coronary stents     . CYSTOSCOPY WITH URETEROSCOPY Right 05/20/2013   Procedure: CYSTOSCOPY, RIGHT URETEROSCOPY STONE EXTRACTION, Insertion of right DOUBLE J STENT ;  Surgeon: Irine Seal, MD;  Location: [facility];  Service: Urology;  Laterality: Right;  . CYSTOSCOPY WITH URETEROSCOPY AND STENT PLACEMENT N/A 06/03/2013   Procedure: SECOND LOOK CYSTOSCOPY WITH URETEROSCOPY  HOLMIUM LASER LITHO AND STONE EXTRACTION Sammie Bench ;  Surgeon: Malka So, MD;   Location: [facility];  Service: Urology;  Laterality: N/A;  . GIVENS CAPSULE STUDY N/A 10/30/2016   Procedure: GIVENS CAPSULE STUDY;  Surgeon: Irene Shipper, MD;  Location: [place];  Service: Endoscopy;  Laterality: N/A;  NEEDS TO BE ADMITTED FOR OBSERVATION PT. HAS DEFIB  . HEMORROIDECTOMY  80's  . HOLMIUM LASER APPLICATION Right [id number]   Procedure: HOLMIUM LASER APPLICATION;  Surgeon: Irine Seal, MD;  Location: [facility];  Service: Urology;  Laterality: Right;  . HYSTEROSCOPY WITH D & C  09/02/2010   Procedure: DILATATION AND CURETTAGE (D&C) /HYSTEROSCOPY;  Surgeon: Margarette Asal;  Location: [facility];  Service: Gynecology;  Laterality: N/A;  Dilation and Curettage with Hysteroscopy and Polypectomy  . IMPLANTABLE CARDIOVERTER DEFIBRILLATOR (ICD) GENERATOR CHANGE N/A 02/10/2014   Procedure: ICD GENERATOR CHANGE;  Surgeon: Sanda Klein, MD;  Location: [facility];  Service: Cardiovascular;  Laterality: N/A;  . LEFT HEART CATHETERIZATION WITH CORONARY ANGIOGRAM N/A 07/28/2011   Procedure: LEFT HEART CATHETERIZATION WITH CORONARY ANGIOGRAM;  Surgeon:  Lorretta Harp, MD;  Location: [facility];  Service: Cardiovascular;  Laterality: N/A;  . LEFT HEART CATHETERIZATION WITH CORONARY ANGIOGRAM N/A 12/31/2013   Procedure: LEFT HEART CATHETERIZATION WITH CORONARY ANGIOGRAM;  Surgeon: Wellington Hampshire, MD;  Location: [place] CATH [facility];  Service: Cardiovascular;  Laterality: N/A;    Social History:  reports that she has never smoked. She has never used smokeless tobacco. She reports that she does not drink alcohol or use drugs.   Allergies  Allergen Reactions  . Sotalol Other (See Comments)    Torsades   . Bactrim [Sulfamethoxazole-Trimethoprim]     Itchy rash  . Ciprofloxacin Nausea Only  . Actos [Pioglitazone] Swelling  . Adhesive [Tape] Other (See Comments)    Causes sores  . Contrast Media [Iodinated Diagnostic Agents] Other (See Comments)    Headache (no action/pre-med required)  . Latex Rash    . Lipitor [Atorvastatin] Other (See Comments)    myalgia    Family History  Problem Relation Age of Onset  . Stroke Mother   . Other Mother        brain tumor  . Other Father        MI  . Heart attack Father   . Stroke Sister   . Macular degeneration Sister   . Diabetes Daughter   . Cancer Daughter        ovarian  . Colon polyps Daughter   . Atrial fibrillation Sister   . Hyperlipidemia Sister   . Osteoporosis Sister   . Stroke Sister   . Uterine cancer Sister   . Heart attack Brother   . Heart disease Brother   . Asthma Brother   . Breast cancer Neg Hx       Prior to Admission medications   Medication Sig Start Date End Date Taking? Authorizing Provider  Calcium Citrate-Vitamin D (CITRACAL + D PO) Take 1 tablet by mouth daily.    [provider]  Cholecalciferol (VITAMIN D) 1000 UNITS capsule Take 2,000 Units by mouth daily.     [provider]  diphenoxylate-atropine (LOMOTIL) 2.5-0.025 MG tablet Take 1 tablet by mouth every 8 (eight) hours as needed for diarrhea or loose stools. 05/16/18 05/16/19  Chipper Herb, MD  ELIQUIS 2.5 MG TABS tablet TAKE  (1)  TABLET TWICE A DAY. Patient taking differently: Take 2.5 mg by mouth 2 (two) times daily.  10/30/18   Croitoru, Mihai, MD  metoprolol tartrate (LOPRESSOR) 25 MG tablet TAKE  (1)  TABLET TWICE A DAY. Patient taking differently: Take 25 mg by mouth 2 (two) times daily.  11/12/18   Croitoru, Mihai, MD  mirtazapine (REMERON) 7.5 MG tablet Take 1 tablet (7.5 mg total) by mouth at bedtime. (Taper off of Paxil first) 12/26/18   [name], DO  Multiple Vitamins-Minerals (CENTRUM SILVER ULTRA WOMENS PO) Take 1 tablet by mouth daily.    [provider]  nitroGLYCERIN (NITROSTAT) 0.4 MG SL tablet Place 1 tablet (0.4 mg total) under the tongue every 5 (five) minutes as needed for chest pain. 12/20/18   Claretta Fraise, MD  pantoprazole (PROTONIX) 40 MG tablet Take 40 mg by mouth daily. 09/30/18    [provider]  rosuvastatin (CRESTOR) 40 MG tablet TAKE (1/2) TABLET DAILY. Patient taking differently: Take 20 mg by mouth daily.  10/22/18   [name], DO  torsemide (DEMADEX) 20 MG tablet Take 20 mg by mouth daily as needed (edema).    [provider]  Vitamin D,  Ergocalciferol, (DRISDOL) 1.25 MG (50000 UT) CAPS capsule Take 1 capsule (50,000 Units total) by mouth every 7 (seven) days. 12/20/18   [name], DO  amLODipine (NORVASC) 2.5 MG tablet Take 1 tablet (2.5 mg total) by mouth daily. 03/04/18 08/28/18  Croitoru, Dani Gobble, MD    Physical Exam: BP 139/80   Pulse 83   Temp 98.3 F (36.8 C) (Oral)   Resp 13   Ht 5\' 6"  (1.676 m)   Wt 63.5 kg   SpO2 94%   BMI 22.60 kg/m   . General: 85 y.o. year-old female well developed well nourished in no acute distress.  Alert and oriented x3. Marland Kitchen HEENT: Normocephalic, atraumatic . NECK: Supple, trachea medial . Cardiovascular: Iregular rate and rhythm with no rubs or gallops.  No thyromegaly or JVD noted.  No lower extremity edema. 2/4 pulses in all 4 extremities. Marland Kitchen Respiratory: Clear to auscultation with no wheezes or rales. Good inspiratory effort. . Abdomen: Soft tender to palpation of lower quadrants. Bowel sounds x4 quadrants. . Muskuloskeletal: No cyanosis, Trace edema noted bilaterally . Neuro: CN II-XII intact, strength, sensation, reflexes . Skin: No ulcerative lesions noted or rashes . Psychiatry: Judgement and insight appear normal. Mood is appropriate for condition and setting          Labs on Admission:  Basic Metabolic Panel: Recent Labs  Lab 01/06/19 1709 01/08/19 2050 01/09/19 0501  NA 128* 137 137  K 4.7 3.9 3.6  CL 94* 98 98  CO2 25 27 28   GLUCOSE 446* 197* 165*  BUN 32* 22 18  CREATININE 1.35* 1.11* 1.06*  CALCIUM 8.8* 8.5* 8.3*  MG  --   --  1.9  PHOS  --   --  3.3   Liver Function Tests: Recent Labs  Lab 01/08/19 2050 01/09/19 0501  AST 20 17  ALT 25 21  ALKPHOS 55  51  BILITOT 1.1 1.2  PROT 5.8* 5.5*  ALBUMIN 3.1* 2.9*   Recent Labs  Lab 01/08/19 2050  LIPASE 39   No results for input(s): AMMONIA in the last 168 hours. CBC: Recent Labs  Lab 01/06/19 1709 01/08/19 2050 01/09/19 0501  WBC 9.3 7.7 7.6  NEUTROABS  --  5.7  --   HGB 11.7* 12.2 11.9*  HCT 35.9* 38.7 37.5  MCV 93.0 95.1 95.4  PLT 221 199 179   Cardiac Enzymes: No results for input(s): CKTOTAL, CKMB, CKMBINDEX, TROPONINI in the last 168 hours.  BNP (last 3 results) No results for input(s): BNP in the last 8760 hours.  ProBNP (last 3 results) No results for input(s): PROBNP in the last 8760 hours.  CBG: Recent Labs  Lab 01/06/19 1700 01/06/19 2035 01/06/19 2231  GLUCAP 404* 292* 279*    Radiological Exams on Admission: CT ABDOMEN PELVIS W CONTRAST  Result Date: 01/08/2019 CLINICAL DATA:  Diverticulitis suspected, hyperglycemia and abdominal pain, COVID positive 12/19/2018 EXAM: CT ABDOMEN AND PELVIS WITH CONTRAST TECHNIQUE: Multidetector CT imaging of the abdomen and pelvis was performed using the standard protocol following bolus administration of intravenous contrast. CONTRAST:  44mL OMNIPAQUE IOHEXOL 300 MG/ML  SOLN COMPARISON:  CT 12/19/2018 FINDINGS: Lower chest: Mixed reticular and patchy ground-glass seen in the periphery of both lung bases compatible with reported history of COVID-19 cardiomegaly with four-chamber enlargement extensive coronary artery calcifications. Multiple apical pacing and defibrillator leads are noted within the heart. No pericardial effusion. Hepatobiliary: No focal liver abnormality is seen. No calcified gallstones or pericholecystic inflammation. Focal thickening at the gallbladder fundus  has an appearance most suggestive of adenomyomatosis. No biliary ductal dilatation. Pancreas: Unremarkable. No pancreatic ductal dilatation or surrounding inflammatory changes. Spleen: Normal in size without focal abnormality. Adrenals/Urinary Tract: Normal  in adrenal glands without focal worrisome adrenal lesion. Asymmetric atrophy of the left kidney. Kidneys however enhance and excrete symmetrically. Few punctate collecting system calcifications without evidence of obstructive urolithiasis or hydronephrosis. No worrisome renal lesions. Few subcentimeter hypoattenuating foci in the right kidney are too small to fully characterize on CT imaging but statistically likely benign. Punctate focus of anti dependent gas seen within the urinary bladder. No frank wall thickening or perivesicular stranding. Stomach/Bowel: Distal esophagus, stomach and duodenal sweep are unremarkable. No small bowel wall thickening or dilatation. No evidence of obstruction. A normal appendix is visualized. No colonic dilatation or wall thickening. There is focal asymmetric rectal wall thickening and perirectal hazy stranding as well as irregular thickening along the mesial left gluteal cleft. Vascular/Lymphatic: Atherosclerotic plaque within the normal caliber aorta. No suspicious or enlarged lymph nodes in the included lymphatic chains. Reproductive: Uterus is surgically absent. No concerning adnexal lesions. Other: Mild body wall edema. No bowel containing hernia. Stable appearance of several curled metallic leads present in the left anterior abdominal wall. No free air or fluid in the abdomen or pelvis. Musculoskeletal: There is marked levocurvature of the lumbar spine centered at L2-3 extensive bridging osteophytosis is similar to comparison studies. There is a stable superior endplate compression deformity at L1 and anterior wedging of T12. IMPRESSION: 1. No evidence of significant diverticular disease. 2. Focal asymmetric rectal wall thickening and perirectal hazy stranding as well as irregular thickening along the mesial left gluteal cleft. Findings could reflect proctitis and possible perianal abscess. Recommend correlation with direct visualization however to exclude underlying mass. 3.  Punctate focus of anti dependent gas within the urinary bladder. If there is no history of recent instrumentation, this could represent a gas-forming organism. If there is concern for infection, recommend correlation with urinalysis. 4. Mixed reticular and patchy ground-glass in the periphery of both lung bases compatible with reported history of COVID-19 5. Focal thickening of the gallbladder fundus without pericholecystic inflammation. Could reflect adenomyomatosis. Could be further evaluated nonemergent outpatient right upper quadrant ultrasound. 6. Asymmetric left renal atrophy. 7. Aortic Atherosclerosis (ICD10-I70.0). Cardiomegaly with extensive coronary artery calcifications. 8. Stable appearance of several curled metallic leads in the left anterior abdominal wall. Correlate with procedural history. 9. Mild body wall edema. 10. Stable superior endplate compression deformity at L1 and anterior wedging of T12. Electronically Signed   By: [name] M.D.   On: 01/08/2019 23:40   DG Chest Portable 1 View  Result Date: 01/08/2019 CLINICAL DATA:  85 year old female with weakness. EXAM: PORTABLE CHEST 1 VIEW COMPARISON:  Chest radiograph dated 12/28/2018. FINDINGS: Bilateral peripheral and subpleural predominant hazy airspace densities similar or slightly progressed since the radiograph of 12/28/2018 most consistent with multifocal pneumonia, likely viral or atypical in etiology including COVID-19. There is no pleural effusion or pneumothorax. Stable cardiac silhouette. Atherosclerotic calcification of the aorta. Left pectoral AICD device. No acute osseous pathology. IMPRESSION: Multifocal pneumonia similar or slightly progressed. Continued follow-up recommended. Electronically Signed   By: Anner Crete M.D.   On: 01/08/2019 21:28    EKG: I independently viewed the EKG done and my findings are as followed: Atrial fibrillation at rate of 100bpm  Assessment/Plan Present on Admission: .  Proctitis  Principal Problem:   Proctitis Active Problems:   Essential hypertension   GERD (gastroesophageal reflux  disease)   Abdominal pain   COVID-19 virus infection   Atrial fibrillation, chronic (HCC)   Hyperlipidemia   Nausea and vomiting   Hyperglycemia  Abdominal pain, nausea and vomiting in the setting of acute proctitis rule out perianal abscess CT abdomen pelvis with contrast showed findings suspicious for proctitis and possible perianal abscess. She was started on IV Zosyn, we shall continue with same at this time Continue IV Zofran 4 mg every 6 hours as needed for nausea/vomiting Continue IV morphine 2 mg every 4 hours as needed for moderate/severe pain Continue clear liquid diet with plan to advance diet as tolerated  Questionable COVID-19 virus pneumonia Chest x-ray showed multifocal pneumonia similar or slightly progressed. Patient denies shortness of breath, cough, fever, chills or any upper respiratory tract infection symptomatology at this time.  She was recently treated with steroids (which resulted in hyperglycemia) and she is currently satting 94% on above on room air Continue to monitor for worsening breathing status and treat accordingly  Hyperglycemia possibly secondary to steroid effect Patient was on steroid due to having tested positive for Covid 19 virus about 3 weeks ago Continue to monitor blood glucose level with morning labs  Essential hypertension Continue home medication when med rec is updated  GERD Continue home medication when med rec is updated  Chronic atrial fibrillation on Eliquis Patient with AICD  Continue Eliquis when med rec is updated Continue home metoprolol when med rec is updated  Hyperlipidemia Continue home meds when med rec is updated  DVT prophylaxis: Eliquis, SCDs  Code Status: Full code  Family Communication: None at bedside  Disposition Plan: Home when clinically improved  Consults called: None  Admission  status: Inpatient  Bernadette Hoit MD [facility]  If 7PM-7AM, please contact night-coverage [email or web address]  01/09/2019, 6:22 AM

## 2019-01-09 NOTE — Progress Notes (Signed)
Patient admitted to the hospital earlier this morning by Dr. Josephine Cables  Patient seen and examined.  She presented with lower abdominal pain which radiates to her rectum/vagina.  She has diffuse lower abdominal pain on palpation.  Bowel sounds are active.  Lungs are clear bilaterally.  A/P:  1. Abdominal pain secondary to acute proctitis.  CT abdomen reports concern for possible perianal abscess.  Images reviewed with Dr. Constance Haw who did not feel there was any drainable fluid collection at this time.  Agree with continue with IV antibiotics for now.  If symptoms not improve, may need repeat imaging. 2. COVID-19 virus pneumonia.  Patient was recently diagnosed earlier this month for COVID-19.  Patient was treated with steroids and has clinically improved.  She is no longer having any symptoms or fevers.  It has been 21 days since her last test and therefore test was not repeated.  Imaging shows multifocal pneumonia.  She appears to be asymptomatic at this time.  Continue to monitor. 3. Hyperglycemia.  Secondary to recent steroids.  Overall blood sugars have improved 4. Hypertension.  Resume home medications. 5. GERD.  Continue home medications 6. Chronic atrial fibrillation.  Patient has AICD.  Continue on Eliquis and metoprolol.  [employer]

## 2019-01-09 NOTE — Progress Notes (Signed)
Pharmacy Antibiotic Note  [name] is a 85 y.o. female admitted on 01/08/2019 with Proctitis.  Pharmacy has been consulted for Zosyn dosing.  Zosyn 3.375gm IV given ~0100  Plan: Zosyn 3.375gm IV every 8 hours. Follow-up micro data, labs, vitals.   Height: 5\' 6"  (167.6 cm) Weight: 140 lb (63.5 kg) IBW/kg (Calculated) : 59.3  Temp (24hrs), Avg:98.3 F (36.8 C), Min:98.3 F (36.8 C), Max:98.3 F (36.8 C)  Recent Labs  Lab 01/06/19 1709 01/08/19 2050 01/09/19 0501  WBC 9.3 7.7 7.6  CREATININE 1.35* 1.11* 1.06*    Estimated Creatinine Clearance: 36.3 mL/min (A) (by C-G formula based on SCr of 1.06 mg/dL (H)).    Allergies  Allergen Reactions  . Sotalol Other (See Comments)    Torsades   . Bactrim [Sulfamethoxazole-Trimethoprim]     Itchy rash  . Ciprofloxacin Nausea Only  . Actos [Pioglitazone] Swelling  . Adhesive [Tape] Other (See Comments)    Causes sores  . Contrast Media [Iodinated Diagnostic Agents] Other (See Comments)    Headache (no action/pre-med required)  . Latex Rash  . Lipitor [Atorvastatin] Other (See Comments)    myalgia    Antimicrobials this admission: 12/24 Zosyn >>   Thank you for allowing pharmacy to be a part of this patient's care.  Sherlon Handing, PharmD, BCPS Please see amion for complete clinical pharmacist phone list 01/09/2019 6:08 AM

## 2019-01-10 MED ORDER — HYDROCODONE-ACETAMINOPHEN 5-325 MG PO TABS
1.0000 | ORAL_TABLET | Freq: Four times a day (QID) | ORAL | Status: DC | PRN
  Administered 2019-01-10 – 2019-01-11 (×3): 1 via ORAL
  Administered 2019-01-11: 2 via ORAL
  Filled 2019-01-10: qty 2
  Filled 2019-01-10 (×3): qty 1

## 2019-01-10 NOTE — Progress Notes (Signed)
PROGRESS NOTE    [name]  [id number] DOB: [birth year] DOA: 01/08/2019 PCP: [name], DO    Brief Narrative:  85 year old female with a history of hypertension, atrial fibrillation, AICD, CKD stage III, presented to the hospital with lower abdominal pain.  CT scan of the abdomen and pelvis showed no significant vascular disease but did show findings suspicious for proctitis and possible perianal abscess.  She was referred for admission.   Assessment & Plan:   Principal Problem:   Proctitis Active Problems:   Essential hypertension   GERD (gastroesophageal reflux disease)   Abdominal pain   COVID-19 virus infection   Atrial fibrillation, chronic (HCC)   Hyperlipidemia   Nausea and vomiting   Hyperglycemia   1. Abdominal pain secondary to acute proctitis.  CT abdomen reports concern for possible perianal abscess.  Images reviewed with Dr. Constance Haw who did not feel there was any drainable fluid collection at this time.  Agree with continue with IV antibiotics for now.  Mild improvement of pain although patient feels she is still having significant pain. If symptoms not improve, may need repeat imaging. 2. COVID-19 virus pneumonia.  Patient was recently diagnosed earlier this month for COVID-19.  Patient was treated with steroids and has clinically improved.  She is no longer having any symptoms or fevers.  It has been 21 days since her last test and therefore test was not repeated.  Imaging shows multifocal pneumonia.  She appears to be asymptomatic at this time.  Continue to monitor. 3. Hyperglycemia.  Secondary to recent steroids.  Overall blood sugars have improved 4. Hypertension.  Resume home medications. 5. GERD.  Continue home medications 6. Chronic atrial fibrillation.  Patient has AICD.  Continue on Eliquis and metoprolol.   DVT prophylaxis: Eliquis Code Status: DNR Family Communication: Discussed with son over the phone Disposition Plan: Discharge  home once improved   Consultants:     Procedures:     Antimicrobials:   Zosyn 12/24 >   Subjective: Continues to have abdominal pain although mildly better than yesterday.  No nausea or vomiting.  Objective: Vitals:   01/09/19 2141 01/10/19 0505 01/10/19 1342 01/10/19 2019  BP: (!) 165/94 (!) 150/93 (!) 167/99   Pulse: 92 77 79   Resp: 16 17 18    Temp: 98.2 F (36.8 C) 98.4 F (36.9 C) 97.8 F (36.6 C)   TempSrc: Oral Oral Oral   SpO2: 99% 99% 97% 95%  Weight:      Height:        Intake/Output Summary (Last 24 hours) at 01/10/2019 2143 Last data filed at 01/10/2019 1819 Gross per 24 hour  Intake 517.49 ml  Output 4 ml  Net 513.49 ml   Filed Weights   01/08/19 2011 01/09/19 2100  Weight: 63.5 kg 62 kg    Examination:  General exam: Appears calm and comfortable  Respiratory system: Clear to auscultation. Respiratory effort normal. Cardiovascular system: S1 & S2 heard, RRR. No JVD, murmurs, rubs, gallops or clicks. No pedal edema. Gastrointestinal system: Abdomen is nondistended, soft and nontender. No organomegaly or masses felt. Normal bowel sounds heard. Central nervous system: Alert and oriented. No focal neurological deficits. Extremities: Symmetric 5 x 5 power. Skin: No rashes, lesions or ulcers Psychiatry: Judgement and insight appear normal. Mood & affect appropriate.     Data Reviewed: I have personally reviewed following labs and imaging studies  CBC: Recent Labs  Lab 01/06/19 1709 01/08/19 2050 01/09/19 0501  WBC 9.3 7.7 7.6  NEUTROABS  --  5.7  --   HGB 11.7* 12.2 11.9*  HCT 35.9* 38.7 37.5  MCV 93.0 95.1 95.4  PLT 221 199 0000000   Basic Metabolic Panel: Recent Labs  Lab 01/06/19 1709 01/08/19 2050 01/09/19 0501  NA 128* 137 137  K 4.7 3.9 3.6  CL 94* 98 98  CO2 25 27 28   GLUCOSE 446* 197* 165*  BUN 32* 22 18  CREATININE 1.35* 1.11* 1.06*  CALCIUM 8.8* 8.5* 8.3*  MG  --   --  1.9  PHOS  --   --  3.3   GFR: Estimated  Creatinine Clearance: 32.1 mL/min (A) (by C-G formula based on SCr of 1.06 mg/dL (H)). Liver Function Tests: Recent Labs  Lab 01/08/19 2050 01/09/19 0501  AST 20 17  ALT 25 21  ALKPHOS 55 51  BILITOT 1.1 1.2  PROT 5.8* 5.5*  ALBUMIN 3.1* 2.9*   Recent Labs  Lab 01/08/19 2050  LIPASE 39   No results for input(s): AMMONIA in the last 168 hours. Coagulation Profile: Recent Labs  Lab 01/09/19 0501  INR 1.2   Cardiac Enzymes: No results for input(s): CKTOTAL, CKMB, CKMBINDEX, TROPONINI in the last 168 hours. BNP (last 3 results) No results for input(s): PROBNP in the last 8760 hours. HbA1C: No results for input(s): HGBA1C in the last 72 hours. CBG: Recent Labs  Lab 01/06/19 1700 01/06/19 2035 01/06/19 2231  GLUCAP 404* 292* 279*   Lipid Profile: No results for input(s): CHOL, HDL, LDLCALC, TRIG, CHOLHDL, LDLDIRECT in the last 72 hours. Thyroid Function Tests: No results for input(s): TSH, T4TOTAL, FREET4, T3FREE, THYROIDAB in the last 72 hours. Anemia Panel: No results for input(s): VITAMINB12, FOLATE, FERRITIN, TIBC, IRON, RETICCTPCT in the last 72 hours. Sepsis Labs: No results for input(s): PROCALCITON, LATICACIDVEN in the last 168 hours.  Recent Results (from the past 240 hour(s))  Urine culture     Status: Abnormal   Collection Time: 01/06/19  9:21 PM   Specimen: Urine, Random  Result Value Ref Range Status   Specimen Description URINE, RANDOM  Final   Special Requests   Final    NONE Performed at [facility], [address], [place], [place] [address]    Culture 80,000 COLONIES/mL ENTEROCOCCUS FAECALIS (A)  Final   Report Status 01/09/2019 FINAL  Final   Organism ID, Bacteria ENTEROCOCCUS FAECALIS (A)  Final      Susceptibility   Enterococcus faecalis - MIC*    AMPICILLIN <=2 SENSITIVE Sensitive     NITROFURANTOIN <=16 SENSITIVE Sensitive     VANCOMYCIN 1 SENSITIVE Sensitive     * 80,000 COLONIES/mL ENTEROCOCCUS FAECALIS          Radiology Studies: CT ABDOMEN PELVIS W CONTRAST  Result Date: 01/08/2019 CLINICAL DATA:  Diverticulitis suspected, hyperglycemia and abdominal pain, COVID positive 12/19/2018 EXAM: CT ABDOMEN AND PELVIS WITH CONTRAST TECHNIQUE: Multidetector CT imaging of the abdomen and pelvis was performed using the standard protocol following bolus administration of intravenous contrast. CONTRAST:  33mL OMNIPAQUE IOHEXOL 300 MG/ML  SOLN COMPARISON:  CT 12/19/2018 FINDINGS: Lower chest: Mixed reticular and patchy ground-glass seen in the periphery of both lung bases compatible with reported history of COVID-19 cardiomegaly with four-chamber enlargement extensive coronary artery calcifications. Multiple apical pacing and defibrillator leads are noted within the heart. No pericardial effusion. Hepatobiliary: No focal liver abnormality is seen. No calcified gallstones or pericholecystic inflammation. Focal thickening at the gallbladder fundus has an appearance most suggestive of adenomyomatosis. No biliary  ductal dilatation. Pancreas: Unremarkable. No pancreatic ductal dilatation or surrounding inflammatory changes. Spleen: Normal in size without focal abnormality. Adrenals/Urinary Tract: Normal in adrenal glands without focal worrisome adrenal lesion. Asymmetric atrophy of the left kidney. Kidneys however enhance and excrete symmetrically. Few punctate collecting system calcifications without evidence of obstructive urolithiasis or hydronephrosis. No worrisome renal lesions. Few subcentimeter hypoattenuating foci in the right kidney are too small to fully characterize on CT imaging but statistically likely benign. Punctate focus of anti dependent gas seen within the urinary bladder. No frank wall thickening or perivesicular stranding. Stomach/Bowel: Distal esophagus, stomach and duodenal sweep are unremarkable. No small bowel wall thickening or dilatation. No evidence of obstruction. A normal appendix is visualized.  No colonic dilatation or wall thickening. There is focal asymmetric rectal wall thickening and perirectal hazy stranding as well as irregular thickening along the mesial left gluteal cleft. Vascular/Lymphatic: Atherosclerotic plaque within the normal caliber aorta. No suspicious or enlarged lymph nodes in the included lymphatic chains. Reproductive: Uterus is surgically absent. No concerning adnexal lesions. Other: Mild body wall edema. No bowel containing hernia. Stable appearance of several curled metallic leads present in the left anterior abdominal wall. No free air or fluid in the abdomen or pelvis. Musculoskeletal: There is marked levocurvature of the lumbar spine centered at L2-3 extensive bridging osteophytosis is similar to comparison studies. There is a stable superior endplate compression deformity at L1 and anterior wedging of T12. IMPRESSION: 1. No evidence of significant diverticular disease. 2. Focal asymmetric rectal wall thickening and perirectal hazy stranding as well as irregular thickening along the mesial left gluteal cleft. Findings could reflect proctitis and possible perianal abscess. Recommend correlation with direct visualization however to exclude underlying mass. 3. Punctate focus of anti dependent gas within the urinary bladder. If there is no history of recent instrumentation, this could represent a gas-forming organism. If there is concern for infection, recommend correlation with urinalysis. 4. Mixed reticular and patchy ground-glass in the periphery of both lung bases compatible with reported history of COVID-19 5. Focal thickening of the gallbladder fundus without pericholecystic inflammation. Could reflect adenomyomatosis. Could be further evaluated nonemergent outpatient right upper quadrant ultrasound. 6. Asymmetric left renal atrophy. 7. Aortic Atherosclerosis (ICD10-I70.0). Cardiomegaly with extensive coronary artery calcifications. 8. Stable appearance of several curled  metallic leads in the left anterior abdominal wall. Correlate with procedural history. 9. Mild body wall edema. 10. Stable superior endplate compression deformity at L1 and anterior wedging of T12. Electronically Signed   By: [name] M.D.   On: 01/08/2019 23:40        Scheduled Meds: . apixaban  2.5 mg Oral BID  . metoprolol tartrate  25 mg Oral BID  . mirtazapine  7.5 mg Oral QHS  . pantoprazole  40 mg Oral Daily  . rosuvastatin  20 mg Oral Daily   Continuous Infusions: . piperacillin-tazobactam (ZOSYN)  IV 12.5 mL/hr at 01/10/19 1657     LOS: 1 day    Time spent: 83mins    Kathie Dike, MD [facility]   If 7PM-7AM, please contact night-coverage [email or web address]  01/10/2019, 9:43 PM

## 2019-01-10 NOTE — Plan of Care (Signed)
  Problem: Education: Goal: Knowledge of General Education information will improve Description Including pain rating scale, medication(s)/side effects and non-pharmacologic comfort measures Outcome: Progressing   

## 2019-01-11 ENCOUNTER — Inpatient Hospital Stay (HOSPITAL_COMMUNITY): Payer: Medicare Other

## 2019-01-11 LAB — CBC
HCT: 39.2 % (ref 36.0–46.0)
Hemoglobin: 11.9 g/dL — ABNORMAL LOW (ref 12.0–15.0)
MCH: 29.8 pg (ref 26.0–34.0)
MCHC: 30.4 g/dL (ref 30.0–36.0)
MCV: 98 fL (ref 80.0–100.0)
Platelets: 172 10*3/uL (ref 150–400)
RBC: 4 MIL/uL (ref 3.87–5.11)
RDW: 13.2 % (ref 11.5–15.5)
WBC: 6 10*3/uL (ref 4.0–10.5)
nRBC: 0 % (ref 0.0–0.2)

## 2019-01-11 LAB — BASIC METABOLIC PANEL
Anion gap: 10 (ref 5–15)
BUN: 10 mg/dL (ref 8–23)
CO2: 28 mmol/L (ref 22–32)
Calcium: 8.3 mg/dL — ABNORMAL LOW (ref 8.9–10.3)
Chloride: 95 mmol/L — ABNORMAL LOW (ref 98–111)
Creatinine, Ser: 1.29 mg/dL — ABNORMAL HIGH (ref 0.44–1.00)
GFR calc Af Amer: 44 mL/min — ABNORMAL LOW (ref >60)
GFR calc non Af Amer: 38 mL/min — ABNORMAL LOW (ref >60)
Glucose, Bld: 164 mg/dL — ABNORMAL HIGH (ref 70–99)
Potassium: 3.1 mmol/L — ABNORMAL LOW (ref 3.5–5.1)
Sodium: 133 mmol/L — ABNORMAL LOW (ref 135–145)

## 2019-01-11 MED ORDER — HYDROXYZINE HCL 25 MG PO TABS
25.0000 mg | ORAL_TABLET | Freq: Three times a day (TID) | ORAL | Status: DC | PRN
  Administered 2019-01-11: 25 mg via ORAL
  Filled 2019-01-11: qty 1

## 2019-01-11 MED ORDER — POTASSIUM CHLORIDE CRYS ER 20 MEQ PO TBCR
40.0000 meq | EXTENDED_RELEASE_TABLET | Freq: Once | ORAL | Status: AC
  Administered 2019-01-11: 40 meq via ORAL
  Filled 2019-01-11: qty 2

## 2019-01-11 MED ORDER — IOHEXOL 300 MG/ML  SOLN
80.0000 mL | Freq: Once | INTRAMUSCULAR | Status: AC | PRN
  Administered 2019-01-11: 80 mL via INTRAVENOUS

## 2019-01-11 NOTE — Progress Notes (Signed)
PROGRESS NOTE    [name]  [id number] DOB: [birth year] DOA: 01/08/2019 PCP: [name], DO    Brief Narrative:  85 year old female with a history of hypertension, atrial fibrillation, AICD, CKD stage III, presented to the hospital with lower abdominal pain.  CT scan of the abdomen and pelvis showed no significant vascular disease but did show findings suspicious for proctitis and possible perianal abscess.  She was referred for admission.   Assessment & Plan:   Principal Problem:   Proctitis Active Problems:   Essential hypertension   GERD (gastroesophageal reflux disease)   Abdominal pain   COVID-19 virus infection   Atrial fibrillation, chronic (HCC)   Hyperlipidemia   Nausea and vomiting   Hyperglycemia   1. Abdominal pain secondary to acute proctitis.  CT abdomen reports concern for possible perianal abscess.  Images reviewed with Dr. Constance Haw who did not feel there was any drainable fluid collection at this time.  Agree with continue with IV antibiotics for now.  Despite antibiotic therapy, she did reports worsening pain today.  Repeat CT abdomen to evaluate for any evolving changes. 2. COVID-19 virus pneumonia.  Patient was recently diagnosed earlier this month for COVID-19.  Patient was treated with steroids and has clinically improved.  She is no longer having any symptoms or fevers.  It has been 21 days since her last test and therefore test was not repeated.  Imaging shows multifocal pneumonia.  She appears to be asymptomatic at this time.  Continue to monitor. 3. Hyperglycemia.  Secondary to recent steroids.  Overall blood sugars have improved 4. Hypertension.  Resume home medications. 5. GERD.  Continue home medications 6. Chronic atrial fibrillation.  Patient has AICD.  Continue on Eliquis and metoprolol.   DVT prophylaxis: Eliquis Code Status: DNR Family Communication: Discussed with son over the phone Disposition Plan: Discharge home once  improved   Consultants:     Procedures:     Antimicrobials:   Zosyn 12/24 >   Subjective: Reports worsening abdominal pain today.  She does not feel well.  She feels as though she had an anxiety attack this morning.  Objective: Vitals:   01/10/19 2019 01/10/19 2146 01/11/19 0551 01/11/19 1237  BP:  (!) 146/87 (!) 142/96 (!) 139/94  Pulse:  87 80 84  Resp:  17 18 18   Temp:  98.6 F (37 C) 97.9 F (36.6 C) (!) 97.5 F (36.4 C)  TempSrc:  Oral Oral Oral  SpO2: 95% 97% 98% 100%  Weight:      Height:        Intake/Output Summary (Last 24 hours) at 01/11/2019 2006 Last data filed at 01/11/2019 1500 Gross per 24 hour  Intake 433.37 ml  Output --  Net 433.37 ml   Filed Weights   01/08/19 2011 01/09/19 2100  Weight: 63.5 kg 62 kg    Examination:  General exam: Alert, awake, oriented x 3 Respiratory system: Clear to auscultation. Respiratory effort normal. Cardiovascular system:RRR. No murmurs, rubs, gallops. Gastrointestinal system: Abdomen is nondistended, soft and diffusely tender, more so on the right side. No organomegaly or masses felt. Normal bowel sounds heard. Central nervous system: Alert and oriented. No focal neurological deficits. Extremities: No C/C/E, +pedal pulses Skin: No rashes, lesions or ulcers Psychiatry: Judgement and insight appear normal. Mood & affect appropriate.   Data Reviewed: I have personally reviewed following labs and imaging studies  CBC: Recent Labs  Lab 01/06/19 1709 01/08/19 2050 01/09/19 0501 01/11/19 0634  WBC 9.3 7.7  7.6 6.0  NEUTROABS  --  5.7  --   --   HGB 11.7* 12.2 11.9* 11.9*  HCT 35.9* 38.7 37.5 39.2  MCV 93.0 95.1 95.4 98.0  PLT 221 199 179 [id number]   Basic Metabolic Panel: Recent Labs  Lab 01/06/19 1709 01/08/19 2050 01/09/19 0501 01/11/19 0634  NA 128* 137 137 133*  K 4.7 3.9 3.6 3.1*  CL 94* 98 98 95*  CO2 25 27 28 28   GLUCOSE 446* 197* 165* 164*  BUN 32* 22 18 10   CREATININE 1.35* 1.11* 1.06*  1.29*  CALCIUM 8.8* 8.5* 8.3* 8.3*  MG  --   --  1.9  --   PHOS  --   --  3.3  --    GFR: Estimated Creatinine Clearance: 26.4 mL/min (A) (by C-G formula based on SCr of 1.29 mg/dL (H)). Liver Function Tests: Recent Labs  Lab 01/08/19 2050 01/09/19 0501  AST 20 17  ALT 25 21  ALKPHOS 55 51  BILITOT 1.1 1.2  PROT 5.8* 5.5*  ALBUMIN 3.1* 2.9*   Recent Labs  Lab 01/08/19 2050  LIPASE 39   No results for input(s): AMMONIA in the last 168 hours. Coagulation Profile: Recent Labs  Lab 01/09/19 0501  INR 1.2   Cardiac Enzymes: No results for input(s): CKTOTAL, CKMB, CKMBINDEX, TROPONINI in the last 168 hours. BNP (last 3 results) No results for input(s): PROBNP in the last 8760 hours. HbA1C: No results for input(s): HGBA1C in the last 72 hours. CBG: Recent Labs  Lab 01/06/19 1700 01/06/19 2035 01/06/19 2231  GLUCAP 404* 292* 279*   Lipid Profile: No results for input(s): CHOL, HDL, LDLCALC, TRIG, CHOLHDL, LDLDIRECT in the last 72 hours. Thyroid Function Tests: No results for input(s): TSH, T4TOTAL, FREET4, T3FREE, THYROIDAB in the last 72 hours. Anemia Panel: No results for input(s): VITAMINB12, FOLATE, FERRITIN, TIBC, IRON, RETICCTPCT in the last 72 hours. Sepsis Labs: No results for input(s): PROCALCITON, LATICACIDVEN in the last 168 hours.  Recent Results (from the past 240 hour(s))  Urine culture     Status: Abnormal   Collection Time: 01/06/19  9:21 PM   Specimen: Urine, Random  Result Value Ref Range Status   Specimen Description URINE, RANDOM  Final   Special Requests   Final    NONE Performed at [facility], [address], [place],  [address]    Culture 80,000 COLONIES/mL ENTEROCOCCUS FAECALIS (A)  Final   Report Status 01/09/2019 FINAL  Final   Organism ID, Bacteria ENTEROCOCCUS FAECALIS (A)  Final      Susceptibility   Enterococcus faecalis - MIC*    AMPICILLIN <=2 SENSITIVE Sensitive     NITROFURANTOIN <=16 SENSITIVE Sensitive       VANCOMYCIN 1 SENSITIVE Sensitive     * 80,000 COLONIES/mL ENTEROCOCCUS FAECALIS         Radiology Studies: CT ABDOMEN PELVIS W CONTRAST  Result Date: 01/11/2019 CLINICAL DATA:  85 year old female with lower abdominal pain. EXAM: CT ABDOMEN AND PELVIS WITH CONTRAST TECHNIQUE: Multidetector CT imaging of the abdomen and pelvis was performed using the standard protocol following bolus administration of intravenous contrast. CONTRAST:  65mL OMNIPAQUE IOHEXOL 300 MG/ML  SOLN COMPARISON:  CT abdomen pelvis dated 01/08/2019. FINDINGS: Lower chest: Bilateral peripheral and subpleural hazy and ground-glass density concerning for multifocal pneumonia, likely atypical or viral infection. Clinical correlation is recommended. There is mild cardiomegaly. Partially visualized AICD wires. No intra-abdominal free air or free fluid. Hepatobiliary: The liver is unremarkable. No  intrahepatic biliary ductal dilatation. No calcified gallstone or pericholecystic fluid. Pancreas: There is a 1 cm hypodense focus in the neck of the pancreas which is similar to prior CT. This is suboptimally characterized but likely represents a small side branch IPMN. This can be better evaluated with MRI without and with contrast. There is no associated dilatation of the main pancreatic duct or gland atrophy. No active inflammatory changes. Spleen: Normal in size without focal abnormality. Adrenals/Urinary Tract: The adrenal glands are unremarkable. Moderate left and mild right renal atrophy with cortical scarring. Multiple bilateral nonobstructing renal calculi. A cluster of adjacent stones in the inferior pole of the right kidney measures up to 7 mm. There is no hydronephrosis on either side. There is symmetric enhancement and excretion of contrast by both kidneys. Right renal inferior pole cysts. The visualized ureters and urinary bladder appear unremarkable. Stomach/Bowel: There is no bowel obstruction or active inflammation. The  appendix is normal. Small scattered colonic diverticula without active inflammatory changes noted. Vascular/Lymphatic: Moderate aortoiliac atherosclerotic disease. There is a 2.4 cm infrarenal aortic ectasia associated with an 8 mm penetrating ulcer. The IVC is unremarkable. No portal venous gas. There is no adenopathy. Reproductive: Hysterectomy. No adnexal masses. Other: Metallic wires in the soft tissues of the anterior abdominal wall similar to prior CT. Musculoskeletal: Scoliosis with degenerative changes of the spine. No acute osseous pathology. IMPRESSION: 1. No acute intra-abdominal or pelvic pathology. No bowel obstruction or active inflammation. Normal appendix. 2. Colonic diverticulosis. 3. Bilateral nonobstructing renal calculi. No hydronephrosis. 4. A 1 cm hypodense focus in the neck of the pancreas similar to prior CT, possibly a side branch IPMN. Further characterization with MRI or attention on follow-up imaging recommended. 5. Bilateral peripheral and subpleural hazy and ground-glass density concerning for multifocal pneumonia, likely atypical or viral infection. Clinical correlation is recommended. 6. Aortic Atherosclerosis (ICD10-I70.0). Electronically Signed   By: Anner Crete M.D.   On: 01/11/2019 17:38        Scheduled Meds:  apixaban  2.5 mg Oral BID   metoprolol tartrate  25 mg Oral BID   mirtazapine  7.5 mg Oral QHS   pantoprazole  40 mg Oral Daily   potassium chloride  40 mEq Oral Once   rosuvastatin  20 mg Oral Daily   Continuous Infusions:  piperacillin-tazobactam (ZOSYN)  IV 12.5 mL/hr at 01/11/19 1500     LOS: 2 days    Time spent: 25mins    Kathie Dike, MD [facility]   If 7PM-7AM, please contact night-coverage [email or web address]  01/11/2019, 8:06 PM

## 2019-01-12 LAB — BASIC METABOLIC PANEL
Anion gap: 8 (ref 5–15)
BUN: 10 mg/dL (ref 8–23)
CO2: 26 mmol/L (ref 22–32)
Calcium: 8.6 mg/dL — ABNORMAL LOW (ref 8.9–10.3)
Chloride: 101 mmol/L (ref 98–111)
Creatinine, Ser: 1.43 mg/dL — ABNORMAL HIGH (ref 0.44–1.00)
GFR calc Af Amer: 39 mL/min — ABNORMAL LOW (ref >60)
GFR calc non Af Amer: 33 mL/min — ABNORMAL LOW (ref >60)
Glucose, Bld: 116 mg/dL — ABNORMAL HIGH (ref 70–99)
Potassium: 4 mmol/L (ref 3.5–5.1)
Sodium: 135 mmol/L (ref 135–145)

## 2019-01-12 MED ORDER — LOPERAMIDE HCL 2 MG PO CAPS
2.0000 mg | ORAL_CAPSULE | Freq: Once | ORAL | Status: AC
  Administered 2019-01-12: 2 mg via ORAL
  Filled 2019-01-12: qty 1

## 2019-01-12 MED ORDER — HYDROCODONE-ACETAMINOPHEN 5-325 MG PO TABS: 1.0000 | ORAL_TABLET | Freq: Four times a day (QID) | ORAL | 0 refills | Status: DC | PRN

## 2019-01-12 MED ORDER — SACCHAROMYCES BOULARDII 250 MG PO CAPS: 250.0000 mg | ORAL_CAPSULE | Freq: Two times a day (BID) | ORAL | 0 refills | Status: DC

## 2019-01-12 MED ORDER — LACTATED RINGERS IV SOLN: INTRAVENOUS | Status: DC

## 2019-01-12 MED ORDER — AMOXICILLIN-POT CLAVULANATE 875-125 MG PO TABS: 1.0000 | ORAL_TABLET | Freq: Two times a day (BID) | ORAL | 0 refills | Status: AC

## 2019-01-12 NOTE — Discharge Summary (Signed)
Physician Discharge Summary  [name] [id number] DOB: 1931/06/16 DOA: 01/08/2019  PCP: [name], DO  Admit date: 01/08/2019 Discharge date: 01/12/2019  Admitted From: Home Disposition: Home  Recommendations for Outpatient Follow-up:  1. Follow up with PCP in 1-2 weeks 2. Please obtain BMP/CBC in one week 3. Follow-up with primary gastroenterologist in the next 4 to 6 weeks to be considered for colonoscopy.  Discharge Condition: Stable CODE STATUS: DNR Diet recommendation: Heart healthy, carb modified  Brief/Interim Summary: 85 year old female with a history of hypertension, atrial fibrillation, AICD, CKD stage III, presented to the hospital with lower abdominal pain.  CT scan of the abdomen and pelvis showed no significant vascular disease but did show findings suspicious for proctitis and possible perianal abscess.  She was referred for admission.  Discharge Diagnoses:  Principal Problem:   Proctitis Active Problems:   Essential hypertension   GERD (gastroesophageal reflux disease)   Abdominal pain   COVID-19 virus infection   Atrial fibrillation, chronic (HCC)   Hyperlipidemia   Nausea and vomiting   Hyperglycemia  1. Abdominal pain secondary to acute proctitis. CT abdomen reports concern for possible perianal abscess. Images reviewed with Dr. Constance Haw who did not feel there was any drainable fluid collection at this time. Agree with continue with IV antibiotics for now.    Patient did not have any fever and WBC count remained stable.  CT scan was repeated after several days and did not show any significant change.  Overall abdominal pain is improving.  She was transitioned to Augmentin to complete her course.  She will need follow-up with gastroenterology next 4 to 6 weeks to consider for colonoscopy. 2. COVID-19 virus pneumonia. Patient was recently diagnosed earlier this month for COVID-19. Patient was treated with steroids and has clinically  improved. She is no longer having any symptoms or fevers. It has been 21 days since her last test and therefore test was not repeated. Imaging shows multifocal pneumonia. She appears to be asymptomatic at this time. Continue to monitor. 3. Hyperglycemia. Secondary to recent steroids. Overall blood sugars have improved 4. Hypertension. Resume home medications. 5. GERD. Continue home medications 6. Chronic atrial fibrillation. Patient has AICD. Continue on Eliquis and metoprolol.  Discharge Instructions  Discharge Instructions    Diet - low sodium heart healthy   Complete by: As directed    Increase activity slowly   Complete by: As directed      Allergies as of 01/12/2019      Reactions   Sotalol Other (See Comments)   Torsades   Bactrim [sulfamethoxazole-trimethoprim]    Itchy rash   Ciprofloxacin Nausea Only   Actos [pioglitazone] Swelling   Adhesive [tape] Other (See Comments)   Causes sores   Contrast Media [iodinated Diagnostic Agents] Other (See Comments)   Headache (no action/pre-med required)   Latex Rash   Lipitor [atorvastatin] Other (See Comments)   myalgia      Medication List    TAKE these medications   amoxicillin-clavulanate 875-125 MG tablet Commonly known as: Augmentin Take 1 tablet by mouth 2 (two) times daily for 10 days.   CENTRUM SILVER ULTRA WOMENS PO Take 1 tablet by mouth daily.   CITRACAL + D PO Take 1 tablet by mouth daily.   diphenoxylate-atropine 2.5-0.025 MG tablet Commonly known as: Lomotil Take 1 tablet by mouth every 8 (eight) hours as needed for diarrhea or loose stools.   Eliquis 2.5 MG Tabs tablet Generic drug: apixaban TAKE  (1)  TABLET TWICE  A DAY. What changed: See the new instructions.   HYDROcodone-acetaminophen 5-325 MG tablet Commonly known as: NORCO/VICODIN Take 1-2 tablets by mouth every 6 (six) hours as needed for moderate pain.   metoprolol tartrate 25 MG tablet Commonly known as: LOPRESSOR TAKE  (1)   TABLET TWICE A DAY. What changed: See the new instructions.   mirtazapine 7.5 MG tablet Commonly known as: REMERON Take 1 tablet (7.5 mg total) by mouth at bedtime. (Taper off of Paxil first)   nitroGLYCERIN 0.4 MG SL tablet Commonly known as: NITROSTAT Place 1 tablet (0.4 mg total) under the tongue every 5 (five) minutes as needed for chest pain.   pantoprazole 40 MG tablet Commonly known as: PROTONIX Take 40 mg by mouth daily.   rosuvastatin 40 MG tablet Commonly known as: CRESTOR TAKE (1/2) TABLET DAILY. What changed: See the new instructions.   saccharomyces boulardii 250 MG capsule Commonly known as: Florastor Take 1 capsule (250 mg total) by mouth 2 (two) times daily.   torsemide 20 MG tablet Commonly known as: DEMADEX Take 20 mg by mouth daily as needed (edema).   Vitamin D (Ergocalciferol) 1.25 MG (50000 UT) Caps capsule Commonly known as: DRISDOL Take 1 capsule (50,000 Units total) by mouth every 7 (seven) days.   Vitamin D 1000 units capsule Take 2,000 Units by mouth daily.      Follow-up Information    Irene Shipper, MD. Schedule an appointment as soon as possible for a visit in 2 week(s).   Specialty: Gastroenterology Contact information: [address] [address] [phone]        [name], DO Follow up.   Specialty: Family Medicine Why: follow up as scheduled Contact information: [place] [address] [phone]        Sanda Klein, MD .   Specialty: Cardiology Contact information: [address] [place] [address] [phone]          Allergies  Allergen Reactions  . Sotalol Other (See Comments)    Torsades   . Bactrim [Sulfamethoxazole-Trimethoprim]     Itchy rash  . Ciprofloxacin Nausea Only  . Actos [Pioglitazone] Swelling  . Adhesive [Tape] Other (See Comments)    Causes sores  . Contrast Media [Iodinated Diagnostic Agents] Other (See Comments)    Headache  (no action/pre-med required)  . Latex Rash  . Lipitor [Atorvastatin] Other (See Comments)    myalgia    Consultations:     Procedures/Studies: CT Abdomen Pelvis Wo Contrast  Result Date: 12/19/2018 CLINICAL DATA:  Nausea, vomiting EXAM: CT ABDOMEN AND PELVIS WITHOUT CONTRAST TECHNIQUE: Multidetector CT imaging of the abdomen and pelvis was performed following the standard protocol without IV contrast. COMPARISON:  02/05/2018 FINDINGS: Lower chest: Cardiomegaly.  No acute abnormality. Hepatobiliary: No focal hepatic abnormality. Gallbladder unremarkable. Pancreas: No focal abnormality or ductal dilatation. Spleen: No focal abnormality.  Normal size. Adrenals/Urinary Tract: Kidneys are atrophic, left greater than right. Scattered areas of calcifications. Nonobstructing bilateral renal stones. No ureteral stones or hydronephrosis. Adrenal glands and urinary bladder unremarkable. Stomach/Bowel: Normal appendix. Portions of the colon are fluid-filled including the right colon and rectosigmoid colon. Stomach and small bowel decompressed. No evidence of bowel obstruction. Vascular/Lymphatic: Heavily calcified aorta. No evidence of aneurysm or adenopathy. Reproductive: Uterus and adnexa unremarkable.  No mass. Other: No free fluid or free air. Musculoskeletal: Degenerative changes throughout the lumbar spine. No acute bony abnormality. IMPRESSION: Atrophic kidneys bilaterally, left greater than right. Areas of calcifications  throughout the kidneys as well as nonobstructing renal stones. No ureteral stones or hydronephrosis. Nonspecific bowel gas pattern with portions of the colon fluid-filled including cecum and rectosigmoid colon. No evidence of bowel obstruction. Cardiomegaly.  Aortic atherosclerosis. Electronically Signed   By: Rolm Baptise M.D.   On: 12/19/2018 21:05   CT ABDOMEN PELVIS W CONTRAST  Result Date: 01/11/2019 CLINICAL DATA:  85 year old female with lower abdominal pain. EXAM: CT ABDOMEN  AND PELVIS WITH CONTRAST TECHNIQUE: Multidetector CT imaging of the abdomen and pelvis was performed using the standard protocol following bolus administration of intravenous contrast. CONTRAST:  60mL OMNIPAQUE IOHEXOL 300 MG/ML  SOLN COMPARISON:  CT abdomen pelvis dated 01/08/2019. FINDINGS: Lower chest: Bilateral peripheral and subpleural hazy and ground-glass density concerning for multifocal pneumonia, likely atypical or viral infection. Clinical correlation is recommended. There is mild cardiomegaly. Partially visualized AICD wires. No intra-abdominal free air or free fluid. Hepatobiliary: The liver is unremarkable. No intrahepatic biliary ductal dilatation. No calcified gallstone or pericholecystic fluid. Pancreas: There is a 1 cm hypodense focus in the neck of the pancreas which is similar to prior CT. This is suboptimally characterized but likely represents a small side branch IPMN. This can be better evaluated with MRI without and with contrast. There is no associated dilatation of the main pancreatic duct or gland atrophy. No active inflammatory changes. Spleen: Normal in size without focal abnormality. Adrenals/Urinary Tract: The adrenal glands are unremarkable. Moderate left and mild right renal atrophy with cortical scarring. Multiple bilateral nonobstructing renal calculi. A cluster of adjacent stones in the inferior pole of the right kidney measures up to 7 mm. There is no hydronephrosis on either side. There is symmetric enhancement and excretion of contrast by both kidneys. Right renal inferior pole cysts. The visualized ureters and urinary bladder appear unremarkable. Stomach/Bowel: There is no bowel obstruction or active inflammation. The appendix is normal. Small scattered colonic diverticula without active inflammatory changes noted. Vascular/Lymphatic: Moderate aortoiliac atherosclerotic disease. There is a 2.4 cm infrarenal aortic ectasia associated with an 8 mm penetrating ulcer. The IVC is  unremarkable. No portal venous gas. There is no adenopathy. Reproductive: Hysterectomy. No adnexal masses. Other: Metallic wires in the soft tissues of the anterior abdominal wall similar to prior CT. Musculoskeletal: Scoliosis with degenerative changes of the spine. No acute osseous pathology. IMPRESSION: 1. No acute intra-abdominal or pelvic pathology. No bowel obstruction or active inflammation. Normal appendix. 2. Colonic diverticulosis. 3. Bilateral nonobstructing renal calculi. No hydronephrosis. 4. A 1 cm hypodense focus in the neck of the pancreas similar to prior CT, possibly a side branch IPMN. Further characterization with MRI or attention on follow-up imaging recommended. 5. Bilateral peripheral and subpleural hazy and ground-glass density concerning for multifocal pneumonia, likely atypical or viral infection. Clinical correlation is recommended. 6. Aortic Atherosclerosis (ICD10-I70.0). Electronically Signed   By: Anner Crete M.D.   On: 01/11/2019 17:38   CT ABDOMEN PELVIS W CONTRAST  Result Date: 01/08/2019 CLINICAL DATA:  Diverticulitis suspected, hyperglycemia and abdominal pain, COVID positive 12/19/2018 EXAM: CT ABDOMEN AND PELVIS WITH CONTRAST TECHNIQUE: Multidetector CT imaging of the abdomen and pelvis was performed using the standard protocol following bolus administration of intravenous contrast. CONTRAST:  48mL OMNIPAQUE IOHEXOL 300 MG/ML  SOLN COMPARISON:  CT 12/19/2018 FINDINGS: Lower chest: Mixed reticular and patchy ground-glass seen in the periphery of both lung bases compatible with reported history of COVID-19 cardiomegaly with four-chamber enlargement extensive coronary artery calcifications. Multiple apical pacing and defibrillator leads are noted within the heart.  No pericardial effusion. Hepatobiliary: No focal liver abnormality is seen. No calcified gallstones or pericholecystic inflammation. Focal thickening at the gallbladder fundus has an appearance most suggestive of  adenomyomatosis. No biliary ductal dilatation. Pancreas: Unremarkable. No pancreatic ductal dilatation or surrounding inflammatory changes. Spleen: Normal in size without focal abnormality. Adrenals/Urinary Tract: Normal in adrenal glands without focal worrisome adrenal lesion. Asymmetric atrophy of the left kidney. Kidneys however enhance and excrete symmetrically. Few punctate collecting system calcifications without evidence of obstructive urolithiasis or hydronephrosis. No worrisome renal lesions. Few subcentimeter hypoattenuating foci in the right kidney are too small to fully characterize on CT imaging but statistically likely benign. Punctate focus of anti dependent gas seen within the urinary bladder. No frank wall thickening or perivesicular stranding. Stomach/Bowel: Distal esophagus, stomach and duodenal sweep are unremarkable. No small bowel wall thickening or dilatation. No evidence of obstruction. A normal appendix is visualized. No colonic dilatation or wall thickening. There is focal asymmetric rectal wall thickening and perirectal hazy stranding as well as irregular thickening along the mesial left gluteal cleft. Vascular/Lymphatic: Atherosclerotic plaque within the normal caliber aorta. No suspicious or enlarged lymph nodes in the included lymphatic chains. Reproductive: Uterus is surgically absent. No concerning adnexal lesions. Other: Mild body wall edema. No bowel containing hernia. Stable appearance of several curled metallic leads present in the left anterior abdominal wall. No free air or fluid in the abdomen or pelvis. Musculoskeletal: There is marked levocurvature of the lumbar spine centered at L2-3 extensive bridging osteophytosis is similar to comparison studies. There is a stable superior endplate compression deformity at L1 and anterior wedging of T12. IMPRESSION: 1. No evidence of significant diverticular disease. 2. Focal asymmetric rectal wall thickening and perirectal hazy stranding  as well as irregular thickening along the mesial left gluteal cleft. Findings could reflect proctitis and possible perianal abscess. Recommend correlation with direct visualization however to exclude underlying mass. 3. Punctate focus of anti dependent gas within the urinary bladder. If there is no history of recent instrumentation, this could represent a gas-forming organism. If there is concern for infection, recommend correlation with urinalysis. 4. Mixed reticular and patchy ground-glass in the periphery of both lung bases compatible with reported history of COVID-19 5. Focal thickening of the gallbladder fundus without pericholecystic inflammation. Could reflect adenomyomatosis. Could be further evaluated nonemergent outpatient right upper quadrant ultrasound. 6. Asymmetric left renal atrophy. 7. Aortic Atherosclerosis (ICD10-I70.0). Cardiomegaly with extensive coronary artery calcifications. 8. Stable appearance of several curled metallic leads in the left anterior abdominal wall. Correlate with procedural history. 9. Mild body wall edema. 10. Stable superior endplate compression deformity at L1 and anterior wedging of T12. Electronically Signed   By: [name] M.D.   On: 01/08/2019 23:40   DG Chest Portable 1 View  Result Date: 01/08/2019 CLINICAL DATA:  85 year old female with weakness. EXAM: PORTABLE CHEST 1 VIEW COMPARISON:  Chest radiograph dated 12/28/2018. FINDINGS: Bilateral peripheral and subpleural predominant hazy airspace densities similar or slightly progressed since the radiograph of 12/28/2018 most consistent with multifocal pneumonia, likely viral or atypical in etiology including COVID-19. There is no pleural effusion or pneumothorax. Stable cardiac silhouette. Atherosclerotic calcification of the aorta. Left pectoral AICD device. No acute osseous pathology. IMPRESSION: Multifocal pneumonia similar or slightly progressed. Continued follow-up recommended. Electronically Signed   By:  Anner Crete M.D.   On: 01/08/2019 21:28   DG Chest Port 1 View  Result Date: 12/28/2018 CLINICAL DATA:  Chest pain EXAM: PORTABLE CHEST 1 VIEW COMPARISON:  08/28/2018  FINDINGS: Pacer defibrillator with power pack over left chest as before. Cardiomediastinal silhouette is stable with cardiac enlargement. No signs of edema, dense consolidation or evidence of pleural effusion. No acute bone finding. Subtle opacities at the lung bases are noted and have developed since the previous exam. IMPRESSION: New subtle basilar opacities may reflect atelectasis or sequela of known COVID infection. No dense consolidation or pleural effusion. Electronically Signed   By: Zetta Bills M.D.   On: 12/28/2018 15:51       Subjective: Abdominal pain is better.  Tolerating diet.  Discharge Exam: Vitals:   01/11/19 2134 01/11/19 2148 01/12/19 0544 01/12/19 1314  BP: (!) 149/91  102/67 122/72  Pulse: 80  75 72  Resp: 18  18 20   Temp: 99.2 F (37.3 C)  98.2 F (36.8 C) 98 F (36.7 C)  TempSrc: Oral  Oral Oral  SpO2: 99% 99% 97% 99%  Weight:      Height:        General: Pt is alert, awake, not in acute distress Cardiovascular: RRR, S1/S2 +, no rubs, no gallops Respiratory: CTA bilaterally, no wheezing, no rhonchi Abdominal: Soft, NT, ND, bowel sounds + Extremities: no edema, no cyanosis    The results of significant diagnostics from this hospitalization (including imaging, microbiology, ancillary and laboratory) are listed below for reference.     Microbiology: Recent Results (from the past 240 hour(s))  Urine culture     Status: Abnormal   Collection Time: 01/06/19  9:21 PM   Specimen: Urine, Random  Result Value Ref Range Status   Specimen Description URINE, RANDOM  Final   Special Requests   Final    NONE Performed at [facility], [address], [place], [place] [address]    Culture 80,000 COLONIES/mL ENTEROCOCCUS FAECALIS (A)  Final   Report Status 01/09/2019 FINAL  Final    Organism ID, Bacteria ENTEROCOCCUS FAECALIS (A)  Final      Susceptibility   Enterococcus faecalis - MIC*    AMPICILLIN <=2 SENSITIVE Sensitive     NITROFURANTOIN <=16 SENSITIVE Sensitive     VANCOMYCIN 1 SENSITIVE Sensitive     * 80,000 COLONIES/mL ENTEROCOCCUS FAECALIS     Labs: BNP (last 3 results) No results for input(s): BNP in the last 8760 hours. Basic Metabolic Panel: Recent Labs  Lab 01/06/19 1709 01/08/19 2050 01/09/19 0501 01/11/19 0634 01/12/19 0747  NA 128* 137 137 133* 135  K 4.7 3.9 3.6 3.1* 4.0  CL 94* 98 98 95* 101  CO2 25 27 28 28 26   GLUCOSE 446* 197* 165* 164* 116*  BUN 32* 22 18 10 10   CREATININE 1.35* 1.11* 1.06* 1.29* 1.43*  CALCIUM 8.8* 8.5* 8.3* 8.3* 8.6*  MG  --   --  1.9  --   --   PHOS  --   --  3.3  --   --    Liver Function Tests: Recent Labs  Lab 01/08/19 2050 01/09/19 0501  AST 20 17  ALT 25 21  ALKPHOS 55 51  BILITOT 1.1 1.2  PROT 5.8* 5.5*  ALBUMIN 3.1* 2.9*   Recent Labs  Lab 01/08/19 2050  LIPASE 39   No results for input(s): AMMONIA in the last 168 hours. CBC: Recent Labs  Lab 01/06/19 1709 01/08/19 2050 01/09/19 0501 01/11/19 0634  WBC 9.3 7.7 7.6 6.0  NEUTROABS  --  5.7  --   --   HGB 11.7* 12.2 11.9* 11.9*  HCT 35.9* 38.7 37.5 39.2  MCV 93.0 95.1 95.4 98.0  PLT 221 199 179 172   Cardiac Enzymes: No results for input(s): CKTOTAL, CKMB, CKMBINDEX, TROPONINI in the last 168 hours. BNP: Invalid input(s): POCBNP CBG: Recent Labs  Lab 01/06/19 1700 01/06/19 2035 01/06/19 2231  GLUCAP 404* 292* 279*   D-Dimer No results for input(s): DDIMER in the last 72 hours. Hgb A1c No results for input(s): HGBA1C in the last 72 hours. Lipid Profile No results for input(s): CHOL, HDL, LDLCALC, TRIG, CHOLHDL, LDLDIRECT in the last 72 hours. Thyroid function studies No results for input(s): TSH, T4TOTAL, T3FREE, THYROIDAB in the last 72 hours.  Invalid input(s): FREET3 Anemia work up No results for input(s):  VITAMINB12, FOLATE, FERRITIN, TIBC, IRON, RETICCTPCT in the last 72 hours. Urinalysis    Component Value Date/Time   COLORURINE STRAW (A) 01/08/2019 2103   APPEARANCEUR CLEAR 01/08/2019 2103   APPEARANCEUR Clear 05/14/2018 1604   LABSPEC 1.004 (L) 01/08/2019 2103   PHURINE 7.0 01/08/2019 2103   GLUCOSEU NEGATIVE 01/08/2019 2103   HGBUR NEGATIVE 01/08/2019 2103   BILIRUBINUR NEGATIVE 01/08/2019 2103   BILIRUBINUR Negative 05/14/2018 [place] 01/08/2019 2103   PROTEINUR NEGATIVE 01/08/2019 2103   UROBILINOGEN negative 03/15/2015 1352   UROBILINOGEN 0.2 07/25/2011 2213   NITRITE NEGATIVE 01/08/2019 2103   LEUKOCYTESUR NEGATIVE 01/08/2019 2103   Sepsis Labs Invalid input(s): PROCALCITONIN,  WBC,  LACTICIDVEN Microbiology Recent Results (from the past 240 hour(s))  Urine culture     Status: Abnormal   Collection Time: 01/06/19  9:21 PM   Specimen: Urine, Random  Result Value Ref Range Status   Specimen Description URINE, RANDOM  Final   Special Requests   Final    NONE Performed at [facility], [place] [address], [place], [place] [address]    Culture 80,000 COLONIES/mL ENTEROCOCCUS FAECALIS (A)  Final   Report Status 01/09/2019 FINAL  Final   Organism ID, Bacteria ENTEROCOCCUS FAECALIS (A)  Final      Susceptibility   Enterococcus faecalis - MIC*    AMPICILLIN <=2 SENSITIVE Sensitive     NITROFURANTOIN <=16 SENSITIVE Sensitive     VANCOMYCIN 1 SENSITIVE Sensitive     * 80,000 COLONIES/mL ENTEROCOCCUS FAECALIS     Time coordinating discharge: 40mins  SIGNED:   Kathie Dike, MD  [facility] 01/12/2019, 10:12 PM   If 7PM-7AM, please contact night-coverage [email or web address]

## 2019-01-12 NOTE — Progress Notes (Signed)
IV removed, 2x2 gauze and paper tape applied to site, patient tolerated well.  Reviewed AVS with patient, who verbalized understanding.  Patient to be taken to front lobby entrance via wheelchair and transported home by her daughter, Colette Ribas, RN.

## 2019-01-13 ENCOUNTER — Telehealth: Payer: Self-pay | Admitting: Family Medicine

## 2019-01-14 ENCOUNTER — Other Ambulatory Visit: Payer: Self-pay

## 2019-01-14 ENCOUNTER — Encounter: Payer: Self-pay | Admitting: Physician Assistant

## 2019-01-14 ENCOUNTER — Ambulatory Visit (INDEPENDENT_AMBULATORY_CARE_PROVIDER_SITE_OTHER): Payer: Medicare Other | Admitting: Physician Assistant

## 2019-01-14 VITALS — BP 141/86 | HR 88 | Ht 63.0 in | Wt 142.8 lb

## 2019-01-14 DIAGNOSIS — R079 Chest pain, unspecified: Secondary | ICD-10-CM

## 2019-01-14 DIAGNOSIS — I4819 Other persistent atrial fibrillation: Secondary | ICD-10-CM

## 2019-01-14 DIAGNOSIS — E1169 Type 2 diabetes mellitus with other specified complication: Secondary | ICD-10-CM

## 2019-01-14 DIAGNOSIS — Z95810 Presence of automatic (implantable) cardiac defibrillator: Secondary | ICD-10-CM

## 2019-01-14 DIAGNOSIS — I25119 Atherosclerotic heart disease of native coronary artery with unspecified angina pectoris: Secondary | ICD-10-CM | POA: Diagnosis not present

## 2019-01-14 DIAGNOSIS — E785 Hyperlipidemia, unspecified: Secondary | ICD-10-CM

## 2019-01-14 DIAGNOSIS — N183 Chronic kidney disease, stage 3 unspecified: Secondary | ICD-10-CM

## 2019-01-14 NOTE — Progress Notes (Signed)
Cardiology Office Note:    Date:  01/16/2019   ID:  [name], DOB 1931/04/29, MRN [id number]  PCP:  [name], DO  Cardiologist:  Sanda Klein, MD  Electrophysiologist:  None   Referring MD: [name], DO   Chief Complaint  Patient presents with  . Hospitalization Follow-up    persistent atrial fibrillation    History of Present Illness:    [name] is a 85 y.o. female with a hx of catecholamine anergic polymorphic VT s/p single chamber ICD was last generator change out 2016 with [facility] device, paroxysmal atrial fibrillation, CAD s/p stents to LAD and left circumflex in 2003, orthostatic hypotension, hyperlipidemia, DM 2, stage III CKD.  Patient was on disopyramide for years for her polymorphic VT, this medication was gradually discontinued due to side effect especially orthostatic hypotension and dry mouth.  In the past, she developed torsades stable on with sotalol and the diarrhea with clonidine.  There was some discussion of using amiodarone to prevent atrial fibrillation episode however she did not want to take it due to side effect profile. She was feeling quite poorly and was event monitor in July 2020 to see if there is a correlation between no episodes of atrial fibrillation her complaint.  No atrial fibrillation was recorded during the monitoring period, although she did have persistent sinus bradycardia.  Beta-blocker was reduced.  Previous heart monitor in September 2019 did suggest there was a correlation between episodic weakness and atrial fibrillation that lasted for 12 to 24 hours with good rate control.  His last seen by Dr. Sallyanne Kuster in October 2020, device interrogation at the time showed lumbar nonsustained episode of ventricular tachycardia of 25 beats.  More recently, patient was admitted in December with proctitis and abdominal pain.  CT of the abdomen concern for possible perianal abscess.  She completed a course of  Augmentin and plan to have outpatient follow-up with GI service.  Patient was also diagnosed earlier in December with COVID-19 and was treated with steroid.  Patient presents today for cardiology office visit.  She is feeling poorly and has increasing shortness of breath and occasional chest discomfort.  The chest discomfort tend to occur at rest.  I recommend echocardiogram to assess her ejection fraction and device interrogation.  I am concerned that that she may have recurrent ventricular ectopy.  EKG today demonstrated she is still in atrial fibrillation.  Some of her shortness of breath may be related to atrial fibrillation however since she just got out of the hospital 2 days ago, I am hesitant to proceed directly to a cardioversion.  I recommend reassessment in 3 weeks at which time a cardioversion can be arranged.  That will allow her adequate time to recover from multiple hospitalization recently.   Past Medical History:  Diagnosis Date  . AICD (automatic cardioverter/defibrillator) present   . Allergy    SESONAL  . Anxiety   . ARTHRITIS   . Arthritis   . Atrial fibrillation ([place])   . CAD (coronary artery disease)    a. history of cardiac arrest 1993;  b. s/p LAD/LCX stenting in 2003;  c. 07/2011 Cath: LM nl, LAD patent stent, D1 80ost, LCX patent stent, RCA min irregs;  d. 04/2012 MV: EF 66%, no ishcemia;  e. 12/2013 Echo: Ef 55%, no rwma, Gr 1 DD, triv AI/MR, mildly dil LA;  f. 12/2013 Lexi MV: intermediate risk - apical ischemia and inf/infsept fixed defect, ? artifactual.  . CAD  in native artery 12/31/2013   Previous stents to LAD and Ramus patent on cath today 12/31/13 also There is severe disease in the ostial first diagonal which is unchanged from most recent cardiac catheterization. The right coronary artery could not be engaged selectively but nonselective angiography showed no significant disease in the proximal and midsegment.     . Cataract    DENIES  . Chronic back pain   .  Chronic sinus bradycardia   . CKD (chronic kidney disease), stage III   . Clotting disorder (HCC)    DVT  . COLITIS 12/02/2007   Qualifier: Diagnosis of  By: Nils Pyle CMA ([place]), [name]    . Diabetes mellitus without complication ([place])    DENIES  . Diverticulosis of colon (without mention of hemorrhage) 2007   Colonoscopy   . Esophageal stricture    a. 2012 s/p dil.  . Esophagitis, unspecified    a. 2012 EGD  . EXTERNAL HEMORRHOIDS   . GERD (gastroesophageal reflux disease)    omeprazole  . H/O hiatal hernia   . Hiatal hernia    a. 2012 EGD.  Marland Kitchen History of DVT in the past, not on Coumadin now    left  leg  . HYPERCHOLESTEROLEMIA   . Hypertension   . ICD (implantable cardiac defibrillator) in place    a. s/p initial ICD in 1993 in setting of cardiac arrest;  b. 01/2007 gen change: Guidant T135 Vitality DS VR single lead ICD.  . Macular degeneration    gets injecton in eye every 5 weeks- last injection - 05/03/2013   . Myocardial infarction ([place]) 1993  . Near syncope 05/22/2015  . Nephrolithiasis, just saw Dr [name]   . Orthostatic hypotension   . Peripheral vascular disease ([place])    ???  . RECTAL BLEEDING 12/03/2007   Qualifier: Diagnosis of  By: Sharlett Iles MD Byrd Hesselbach   . Unspecified gastritis and gastroduodenitis without mention of hemorrhage    a. 2003 EGD->not noted on 2012 EGD.  Marland Kitchen Unstable angina, neg MI, cath stable.maybe GI 12/30/2013    Past Surgical History:  Procedure Laterality Date  . BREAST BIOPSY Left 11/2013  . CARDIAC CATHETERIZATION    . CARDIAC CATHETERIZATION  01/01/15   patent stents -there is severe disease in ostial 1st diag but without change.   Marland Kitchen CARDIAC DEFIBRILLATOR PLACEMENT  02/05/2007   Guidant  . CARDIOVASCULAR STRESS TEST  12/30/2013   abnormal  . COLONOSCOPY    . coronary stents     . CYSTOSCOPY WITH URETEROSCOPY Right 05/20/2013   Procedure: CYSTOSCOPY, RIGHT URETEROSCOPY STONE EXTRACTION, Insertion of right DOUBLE J STENT ;   Surgeon: Irine Seal, MD;  Location: [facility];  Service: Urology;  Laterality: Right;  . CYSTOSCOPY WITH URETEROSCOPY AND STENT PLACEMENT N/A 06/03/2013   Procedure: SECOND LOOK CYSTOSCOPY WITH URETEROSCOPY  HOLMIUM LASER LITHO AND STONE EXTRACTION Sammie Bench ;  Surgeon: Malka So, MD;  Location: [facility];  Service: Urology;  Laterality: N/A;  . GIVENS CAPSULE STUDY N/A 10/30/2016   Procedure: GIVENS CAPSULE STUDY;  Surgeon: Irene Shipper, MD;  Location: [facility];  Service: Endoscopy;  Laterality: N/A;  NEEDS TO BE ADMITTED FOR OBSERVATION PT. HAS DEFIB  . HEMORROIDECTOMY  80's  . HOLMIUM LASER APPLICATION Right [id number]   Procedure: HOLMIUM LASER APPLICATION;  Surgeon: Irine Seal, MD;  Location: [facility];  Service: Urology;  Laterality: Right;  . HYSTEROSCOPY WITH D & C  09/02/2010   Procedure: DILATATION AND CURETTAGE (D&C) /  HYSTEROSCOPY;  Surgeon: Margarette Asal;  Location: [facility];  Service: Gynecology;  Laterality: N/A;  Dilation and Curettage with Hysteroscopy and Polypectomy  . IMPLANTABLE CARDIOVERTER DEFIBRILLATOR (ICD) GENERATOR CHANGE N/A 02/10/2014   Procedure: ICD GENERATOR CHANGE;  Surgeon: Sanda Klein, MD;  Location: [facility];  Service: Cardiovascular;  Laterality: N/A;  . LEFT HEART CATHETERIZATION WITH CORONARY ANGIOGRAM N/A 07/28/2011   Procedure: LEFT HEART CATHETERIZATION WITH CORONARY ANGIOGRAM;  Surgeon: Lorretta Harp, MD;  Location: [facility] CATH LAB;  Service: Cardiovascular;  Laterality: N/A;  . LEFT HEART CATHETERIZATION WITH CORONARY ANGIOGRAM N/A 12/31/2013   Procedure: LEFT HEART CATHETERIZATION WITH CORONARY ANGIOGRAM;  Surgeon: Wellington Hampshire, MD;  Location: [facility];  Service: Cardiovascular;  Laterality: N/A;    Current Medications: Current Meds  Medication Sig  . amoxicillin-clavulanate (AUGMENTIN) 875-125 MG tablet Take 1 tablet by mouth 2 (two) times daily for 10 days.  . Calcium Citrate-Vitamin D (CITRACAL + D PO) Take 1 tablet by mouth daily.  .  Cholecalciferol (VITAMIN D) 1000 UNITS capsule Take 2,000 Units by mouth daily.   . diphenoxylate-atropine (LOMOTIL) 2.5-0.025 MG tablet Take 1 tablet by mouth every 8 (eight) hours as needed for diarrhea or loose stools.  Marland Kitchen ELIQUIS 2.5 MG TABS tablet TAKE  (1)  TABLET TWICE A DAY. (Patient taking differently: Take 2.5 mg by mouth 2 (two) times daily. )  . HYDROcodone-acetaminophen (NORCO/VICODIN) 5-325 MG tablet Take 1-2 tablets by mouth every 6 (six) hours as needed for moderate pain.  . metoprolol tartrate (LOPRESSOR) 25 MG tablet TAKE  (1)  TABLET TWICE A DAY. (Patient taking differently: Take 25 mg by mouth 2 (two) times daily. )  . mirtazapine (REMERON) 7.5 MG tablet Take 1 tablet (7.5 mg total) by mouth at bedtime. (Taper off of Paxil first)  . Multiple Vitamins-Minerals (CENTRUM SILVER ULTRA WOMENS PO) Take 1 tablet by mouth daily.  . nitroGLYCERIN (NITROSTAT) 0.4 MG SL tablet Place 1 tablet (0.4 mg total) under the tongue every 5 (five) minutes as needed for chest pain.  . pantoprazole (PROTONIX) 40 MG tablet Take 40 mg by mouth daily.  . rosuvastatin (CRESTOR) 40 MG tablet TAKE (1/2) TABLET DAILY. (Patient taking differently: Take 20 mg by mouth daily. )  . saccharomyces boulardii (FLORASTOR) 250 MG capsule Take 1 capsule (250 mg total) by mouth 2 (two) times daily.  Marland Kitchen torsemide (DEMADEX) 20 MG tablet Take 20 mg by mouth daily as needed (edema).  . Vitamin D, Ergocalciferol, (DRISDOL) 1.25 MG (50000 UT) CAPS capsule Take 1 capsule (50,000 Units total) by mouth every 7 (seven) days.     Allergies:   Sotalol, Bactrim [sulfamethoxazole-trimethoprim], Ciprofloxacin, Actos [pioglitazone], Adhesive [tape], Contrast media [iodinated diagnostic agents], Latex, and Lipitor [atorvastatin]   Social History   Socioeconomic History  . Marital status: Widowed    Spouse name: Not on file  . Number of children: Not on file  . Years of education: Not on file  . Highest education level: Not on file    Occupational History  . Not on file  Tobacco Use  . Smoking status: Never Smoker  . Smokeless tobacco: Never Used  Substance and Sexual Activity  . Alcohol use: No  . Drug use: No  . Sexual activity: Never    Birth control/protection: Post-menopausal  Other Topics Concern  . Not on file  Social History Narrative  . Not on file   Social Determinants of Health   Financial Resource Strain:   . Difficulty of  Paying Living Expenses: Not on file  Food Insecurity:   . Worried About Charity fundraiser in the Last Year: Not on file  . Ran Out of Food in the Last Year: Not on file  Transportation Needs:   . Lack of Transportation (Medical): Not on file  . Lack of Transportation (Non-Medical): Not on file  Physical Activity:   . Days of Exercise per Week: Not on file  . Minutes of Exercise per Session: Not on file  Stress:   . Feeling of Stress : Not on file  Social Connections:   . Frequency of Communication with Friends and Family: Not on file  . Frequency of Social Gatherings with Friends and Family: Not on file  . Attends Religious Services: Not on file  . Active Member of Clubs or Organizations: Not on file  . Attends Archivist Meetings: Not on file  . Marital Status: Not on file     Family History: The patient's family history includes Asthma in her brother; Atrial fibrillation in her sister; Cancer in her daughter; Colon polyps in her daughter; Diabetes in her daughter; Heart attack in her brother and father; Heart disease in her brother; Hyperlipidemia in her sister; Macular degeneration in her sister; Osteoporosis in her sister; Other in her father and mother; Stroke in her mother, sister, and sister; Uterine cancer in her sister. There is no history of Breast cancer.  ROS:   Please see the history of present illness.     All other systems reviewed and are negative.  EKGs/Labs/Other Studies Reviewed:    The following studies were reviewed today:  Echo  09/27/2017 LV EF: 55% -   60% Study Conclusions  - Left ventricle: The cavity size was normal. Systolic function was   normal. The estimated ejection fraction was in the range of 55%   to 60%. Wall motion was normal; there were no regional wall   motion abnormalities. Doppler parameters are consistent with   abnormal left ventricular relaxation (grade 1 diastolic   dysfunction). - Aortic valve: There was trivial regurgitation. - Aorta: Ascending aortic diameter: 41 mm (S). - Ascending aorta: The ascending aorta was mildly dilated. - Mitral valve: Calcified annulus. - Right ventricle: Pacer wire or catheter noted in right ventricle. - Pulmonary arteries: Systolic pressure was mildly increased. PA   peak pressure: 43 mm Hg (S).  Impressions:  - Compared to the prior study, there has been no significant   interval change.  EKG:  EKG is ordered today.  The ekg ordered today demonstrates atrial fibrillation, poor R wave progression anterior leads.  Recent Labs: 02/05/2018: TSH 3.439 01/09/2019: ALT 21; Magnesium 1.9 01/11/2019: Hemoglobin 11.9; Platelets 172 01/12/2019: BUN 10; Creatinine, Ser 1.43; Potassium 4.0; Sodium 135  Recent Lipid Panel    Component Value Date/Time   CHOL 139 10/23/2018 0947   CHOL 140 07/31/2012 1019   TRIG 211 (H) 12/28/2018 1601   TRIG 115 06/07/2015 1111   TRIG 161 (H) 07/31/2012 1019   HDL 53 10/23/2018 0947   HDL 68 06/07/2015 1111   HDL 43 07/31/2012 1019   CHOLHDL 2.6 10/23/2018 0947   CHOLHDL 2.8 09/02/2010 0629   VLDL 34 09/02/2010 0629   LDLCALC 65 10/23/2018 0947   LDLCALC 65 09/09/2013 0841   LDLCALC 65 07/31/2012 1019    Physical Exam:    VS:  BP (!) 141/86   Pulse 88   Ht 5\' 3"  (1.6 m)   Wt 142 lb 12.8 oz (  64.8 kg)   SpO2 94%   BMI 25.30 kg/m     Wt Readings from Last 3 Encounters:  01/14/19 142 lb 12.8 oz (64.8 kg)  01/09/19 136 lb 11 oz (62 kg)  01/06/19 140 lb (63.5 kg)     GEN:  Well nourished, well developed in  no acute distress HEENT: Normal NECK: No JVD; No carotid bruits LYMPHATICS: No lymphadenopathy CARDIAC: Irregularly irregular, no murmurs, rubs, gallops RESPIRATORY:  Clear to auscultation without rales, wheezing or rhonchi  ABDOMEN: Soft, non-tender, non-distended MUSCULOSKELETAL:  No edema; No deformity  SKIN: Warm and dry NEUROLOGIC:  Alert and oriented x 3 PSYCHIATRIC:  Normal affect   ASSESSMENT:    1. Persistent atrial fibrillation (HCC)   2. Chest pain of uncertain etiology   3. ICD (implantable cardioverter-defibrillator) in place   4. Coronary artery disease involving native coronary artery of native heart with angina pectoris ([place])   5. Hyperlipidemia associated with type 2 diabetes mellitus (HCC)   6. Stage 3 chronic kidney disease, unspecified whether stage 3a or 3b CKD    PLAN:    In order of problems listed above:  1. Persistent atrial fibrillation: She has been in atrial fibrillation since October.  Likely related to recent Covid infection.  Since she just left the hospital 2 days ago and appears to be deconditioned, I recommend reassessment in 3 weeks and then consider outpatient cardioversion.  Continue Eliquis  2. Chest pain: The characteristic of the chest pain somewhat atypical.  There is no ST-T wave changes on today's EKG.  I suspect some of her chest pain may be related to recent atrial fibrillation.  Recommend echocardiogram  3. History of NSVT s/p ICD: Recent device interrogation showed nonsustained VT.  She has been feeling poorly with occasional dizzy spell.  I recommended another interrogation today to make sure she has not had any further nonsustained VT.  4. Chronic respiratory failure: Related to recent Covid infection.  She still appears to be deconditioned.  5. CAD: Obtain echocardiogram for the time being.  We will proceed with cardioversion in 3 weeks after reassessment.  If chest pain still persist afterward, may consider  Myoview  6. Hyperlipidemia: On Crestor  7. CKD stage III: Stable on recent lab work   Medication Adjustments/Labs and Tests Ordered: Current medicines are reviewed at length with the patient today.  Concerns regarding medicines are outlined above.  Orders Placed This Encounter  Procedures  . EKG  . EKG 12-Lead  . ECHOCARDIOGRAM COMPLETE   No orders of the defined types were placed in this encounter.   Patient Instructions  Medication Instructions:  Your physician recommends that you continue on your current medications as directed. Please refer to the Current Medication list given to you today.  *If you need a refill on your cardiac medications before your next appointment, please call your pharmacy*  Lab Work: NONE ordered at this time of appointment   If you have labs (blood work) drawn today and your tests are completely normal, you will receive your results only by: Marland Kitchen MyChart Message (if you have MyChart) OR . A paper copy in the mail If you have any lab test that is abnormal or we need to change your treatment, we will call you to review the results.  Testing/Procedures: Your physician has requested that you have an echocardiogram. Echocardiography is a painless test that uses sound waves to create images of your heart. It provides your doctor with information about the size and  shape of your heart and how well your heart's chambers and valves are working. This procedure takes approximately one hour. There are no restrictions for this procedure.   PLEASE SCHEDULE FOR 1-2 WEEKS  Follow-Up: At [facility], you and your health needs are our priority.  As part of our continuing mission to provide you with exceptional heart care, we have created designated Provider Care Teams.  These Care Teams include your primary Cardiologist (physician) and Advanced Practice Providers (APPs -  Physician Assistants and Nurse Practitioners) who all work together to provide you with the care  you need, when you need it.  Your next appointment:   3 week(s)  The format for your next appointment:   In Person  Provider:   Almyra Deforest, PA-C  Other Instructions      Signed, Almyra Deforest, [place]  01/16/2019 1:45 AM    [place]

## 2019-01-14 NOTE — Telephone Encounter (Signed)
Pt called - appt made  ?

## 2019-01-14 NOTE — Patient Instructions (Signed)
Medication Instructions:  Your physician recommends that you continue on your current medications as directed. Please refer to the Current Medication list given to you today.  *If you need a refill on your cardiac medications before your next appointment, please call your pharmacy*  Lab Work: NONE ordered at this time of appointment   If you have labs (blood work) drawn today and your tests are completely normal, you will receive your results only by: Marland Kitchen MyChart Message (if you have MyChart) OR . A paper copy in the mail If you have any lab test that is abnormal or we need to change your treatment, we will call you to review the results.  Testing/Procedures: Your physician has requested that you have an echocardiogram. Echocardiography is a painless test that uses sound waves to create images of your heart. It provides your doctor with information about the size and shape of your heart and how well your heart's chambers and valves are working. This procedure takes approximately one hour. There are no restrictions for this procedure.   PLEASE SCHEDULE FOR 1-2 WEEKS  Follow-Up: At [facility], you and your health needs are our priority.  As part of our continuing mission to provide you with exceptional heart care, we have created designated Provider Care Teams.  These Care Teams include your primary Cardiologist (physician) and Advanced Practice Providers (APPs -  Physician Assistants and Nurse Practitioners) who all work together to provide you with the care you need, when you need it.  Your next appointment:   3 week(s)  The format for your next appointment:   In Person  Provider:   Almyra Deforest, PA-C  Other Instructions

## 2019-01-15 ENCOUNTER — Encounter: Payer: Self-pay | Admitting: *Deleted

## 2019-01-15 ENCOUNTER — Telehealth: Payer: Self-pay | Admitting: *Deleted

## 2019-01-15 NOTE — Telephone Encounter (Signed)
The patient was seen in the office yesterday. She was asked to do a download once she got home. The download was received according to the device clinic. Once the results have been reviewed, the daughter has been made aware that a call will be made if any changes are needed.

## 2019-01-16 ENCOUNTER — Encounter: Payer: Self-pay | Admitting: Physician Assistant

## 2019-01-20 ENCOUNTER — Other Ambulatory Visit: Payer: Self-pay | Admitting: Family Medicine

## 2019-01-20 ENCOUNTER — Ambulatory Visit (INDEPENDENT_AMBULATORY_CARE_PROVIDER_SITE_OTHER): Payer: Medicare Other | Admitting: Family Medicine

## 2019-01-20 ENCOUNTER — Encounter: Payer: Self-pay | Admitting: Family Medicine

## 2019-01-20 ENCOUNTER — Other Ambulatory Visit: Payer: Self-pay

## 2019-01-20 VITALS — BP 112/78 | HR 98 | Temp 96.9°F | Ht 63.0 in | Wt 143.0 lb

## 2019-01-20 DIAGNOSIS — Z09 Encounter for follow-up examination after completed treatment for conditions other than malignant neoplasm: Secondary | ICD-10-CM

## 2019-01-20 DIAGNOSIS — K6289 Other specified diseases of anus and rectum: Secondary | ICD-10-CM | POA: Diagnosis not present

## 2019-01-20 DIAGNOSIS — K580 Irritable bowel syndrome with diarrhea: Secondary | ICD-10-CM

## 2019-01-20 DIAGNOSIS — I4819 Other persistent atrial fibrillation: Secondary | ICD-10-CM

## 2019-01-20 DIAGNOSIS — N1832 Chronic kidney disease, stage 3b: Secondary | ICD-10-CM | POA: Diagnosis not present

## 2019-01-20 DIAGNOSIS — F32A Depression, unspecified: Secondary | ICD-10-CM

## 2019-01-20 DIAGNOSIS — L299 Pruritus, unspecified: Secondary | ICD-10-CM

## 2019-01-20 DIAGNOSIS — Z1231 Encounter for screening mammogram for malignant neoplasm of breast: Secondary | ICD-10-CM

## 2019-01-20 DIAGNOSIS — F329 Major depressive disorder, single episode, unspecified: Secondary | ICD-10-CM

## 2019-01-20 MED ORDER — LORATADINE 10 MG PO TABS: 10.0000 mg | ORAL_TABLET | Freq: Every day | ORAL | 11 refills | Status: DC

## 2019-01-20 NOTE — Progress Notes (Signed)
Subjective: CC: Hospital discharge follow-up PCP: [name], DO BE:[id number] [name] is a 86 y.o. female presenting to clinic today for:  1.  Hospital discharge follow-up for perirectal abscess Patient was admitted for perirectal abscess that was noted on CT abdomen.  She was treated with IV antibiotics and ultimately transitioned over to Augmentin.  Of note, patient was diagnosed with COVID-19 pneumonia earlier in December.  Her chest x-ray was consistent with multifocal pneumonia but she was asymptomatic and therefore COVID-19 test was not repeated.  At discharge from the hospital, she had no leukocytosis.  Renal function was impaired with a slightly rising creatinine but was still amongst her baseline.  She reports that she is feeling much better.  She still has a few days of the Augmentin left.  Denies any abdominal pain, nausea, vomiting.  She does have chronic diarrhea that is about baseline.  She notes that this is refractory to use of Lomotil and Florastor.  She has been using intermittent Imodium which does seem to help.  Denies any overt rectal pain but does note some burning with defecation.  No chest pain, shortness of breath, hemoptysis or cough.   ROS: Per HPI  Allergies  Allergen Reactions  . Sotalol Other (See Comments)    Torsades   . Bactrim [Sulfamethoxazole-Trimethoprim]     Itchy rash  . Ciprofloxacin Nausea Only  . Actos [Pioglitazone] Swelling  . Adhesive [Tape] Other (See Comments)    Causes sores  . Contrast Media [Iodinated Diagnostic Agents] Other (See Comments)    Headache (no action/pre-med required)  . Latex Rash  . Lipitor [Atorvastatin] Other (See Comments)    myalgia   Past Medical History:  Diagnosis Date  . AICD (automatic cardioverter/defibrillator) present   . Allergy    SESONAL  . Anxiety   . ARTHRITIS   . Arthritis   . Atrial fibrillation ([place])   . CAD (coronary artery disease)    a. history of cardiac arrest 1993;  b.  s/p LAD/LCX stenting in 2003;  c. 07/2011 Cath: LM nl, LAD patent stent, D1 80ost, LCX patent stent, RCA min irregs;  d. 04/2012 MV: EF 66%, no ishcemia;  e. 12/2013 Echo: Ef 55%, no rwma, Gr 1 DD, triv AI/MR, mildly dil LA;  f. 12/2013 Lexi MV: intermediate risk - apical ischemia and inf/infsept fixed defect, ? artifactual.  . CAD in native artery 12/31/2013   Previous stents to LAD and Ramus patent on cath today 12/31/13 also There is severe disease in the ostial first diagonal which is unchanged from most recent cardiac catheterization. The right coronary artery could not be engaged selectively but nonselective angiography showed no significant disease in the proximal and midsegment.     . Cataract    DENIES  . Chronic back pain   . Chronic sinus bradycardia   . CKD (chronic kidney disease), stage III   . Clotting disorder (HCC)    DVT  . COLITIS 12/02/2007   Qualifier: Diagnosis of  By: Nils Pyle CMA ([facility]), [name]    . Diabetes mellitus without complication ([place])    DENIES  . Diverticulosis of colon (without mention of hemorrhage) 2007   Colonoscopy   . Esophageal stricture    a. 2012 s/p dil.  . Esophagitis, unspecified    a. 2012 EGD  . EXTERNAL HEMORRHOIDS   . GERD (gastroesophageal reflux disease)    omeprazole  . H/O hiatal hernia   . Hiatal hernia    a. 2012 EGD.  Marland Kitchen  History of DVT in the past, not on Coumadin now    left  leg  . HYPERCHOLESTEROLEMIA   . Hypertension   . ICD (implantable cardiac defibrillator) in place    a. s/p initial ICD in 1993 in setting of cardiac arrest;  b. 01/2007 gen change: Guidant T135 Vitality DS VR single lead ICD.  . Macular degeneration    gets injecton in eye every 5 weeks- last injection - 05/03/2013   . Myocardial infarction ([place]) 1993  . Near syncope 05/22/2015  . Nephrolithiasis, just saw Dr [name]   . Orthostatic hypotension   . Peripheral vascular disease ([place])    ???  . RECTAL BLEEDING 12/03/2007   Qualifier: Diagnosis of  By:  Sharlett Iles MD Byrd Hesselbach   . Unspecified gastritis and gastroduodenitis without mention of hemorrhage    a. 2003 EGD->not noted on 2012 EGD.  Marland Kitchen Unstable angina, neg MI, cath stable.maybe GI 12/30/2013    Current Outpatient Medications:  .  amoxicillin-clavulanate (AUGMENTIN) 875-125 MG tablet, Take 1 tablet by mouth 2 (two) times daily for 10 days., Disp: 20 tablet, Rfl: 0 .  Calcium Citrate-Vitamin D (CITRACAL + D PO), Take 1 tablet by mouth daily., Disp: , Rfl:  .  Cholecalciferol (VITAMIN D) 1000 UNITS capsule, Take 2,000 Units by mouth daily. , Disp: , Rfl:  .  diphenoxylate-atropine (LOMOTIL) 2.5-0.025 MG tablet, Take 1 tablet by mouth every 8 (eight) hours as needed for diarrhea or loose stools., Disp: 30 tablet, Rfl: 1 .  ELIQUIS 2.5 MG TABS tablet, TAKE  (1)  TABLET TWICE A DAY. (Patient taking differently: Take 2.5 mg by mouth 2 (two) times daily. ), Disp: 180 tablet, Rfl: 1 .  metoprolol tartrate (LOPRESSOR) 25 MG tablet, TAKE  (1)  TABLET TWICE A DAY. (Patient taking differently: Take 25 mg by mouth 2 (two) times daily. ), Disp: 180 tablet, Rfl: 0 .  Multiple Vitamins-Minerals (CENTRUM SILVER ULTRA WOMENS PO), Take 1 tablet by mouth daily., Disp: , Rfl:  .  nitroGLYCERIN (NITROSTAT) 0.4 MG SL tablet, Place 1 tablet (0.4 mg total) under the tongue every 5 (five) minutes as needed for chest pain., Disp: 25 tablet, Rfl: 2 .  pantoprazole (PROTONIX) 40 MG tablet, Take 40 mg by mouth daily., Disp: , Rfl:  .  rosuvastatin (CRESTOR) 40 MG tablet, TAKE (1/2) TABLET DAILY. (Patient taking differently: Take 20 mg by mouth daily. ), Disp: 45 tablet, Rfl: 0 .  saccharomyces boulardii (FLORASTOR) 250 MG capsule, Take 1 capsule (250 mg total) by mouth 2 (two) times daily., Disp: 30 capsule, Rfl: 0 .  torsemide (DEMADEX) 20 MG tablet, Take 20 mg by mouth daily as needed (edema)., Disp: , Rfl:  .  Vitamin D, Ergocalciferol, (DRISDOL) 1.25 MG (50000 UT) CAPS capsule, Take 1 capsule (50,000 Units  total) by mouth every 7 (seven) days., Disp: 8 capsule, Rfl: 0 .  HYDROcodone-acetaminophen (NORCO/VICODIN) 5-325 MG tablet, Take 1-2 tablets by mouth every 6 (six) hours as needed for moderate pain. (Patient not taking: Reported on 01/20/2019), Disp: 12 tablet, Rfl: 0 .  mirtazapine (REMERON) 7.5 MG tablet, Take 1 tablet (7.5 mg total) by mouth at bedtime. (Taper off of Paxil first), Disp: 30 tablet, Rfl: 0 Social History   Socioeconomic History  . Marital status: Widowed    Spouse name: Not on file  . Number of children: Not on file  . Years of education: Not on file  . Highest education level: Not on file  Occupational History  .  Not on file  Tobacco Use  . Smoking status: Never Smoker  . Smokeless tobacco: Never Used  Substance and Sexual Activity  . Alcohol use: No  . Drug use: No  . Sexual activity: Never    Birth control/protection: Post-menopausal  Other Topics Concern  . Not on file  Social History Narrative  . Not on file   Social Determinants of Health   Financial Resource Strain:   . Difficulty of Paying Living Expenses: Not on file  Food Insecurity:   . Worried About Charity fundraiser in the Last Year: Not on file  . Ran Out of Food in the Last Year: Not on file  Transportation Needs:   . Lack of Transportation (Medical): Not on file  . Lack of Transportation (Non-Medical): Not on file  Physical Activity:   . Days of Exercise per Week: Not on file  . Minutes of Exercise per Session: Not on file  Stress:   . Feeling of Stress : Not on file  Social Connections:   . Frequency of Communication with Friends and Family: Not on file  . Frequency of Social Gatherings with Friends and Family: Not on file  . Attends Religious Services: Not on file  . Active Member of Clubs or Organizations: Not on file  . Attends Archivist Meetings: Not on file  . Marital Status: Not on file  Intimate Partner Violence:   . Fear of Current or Ex-Partner: Not on file  .  Emotionally Abused: Not on file  . Physically Abused: Not on file  . Sexually Abused: Not on file   Family History  Problem Relation Age of Onset  . Stroke Mother   . Other Mother        brain tumor  . Other Father        MI  . Heart attack Father   . Stroke Sister   . Macular degeneration Sister   . Diabetes Daughter   . Cancer Daughter        ovarian  . Colon polyps Daughter   . Atrial fibrillation Sister   . Hyperlipidemia Sister   . Osteoporosis Sister   . Stroke Sister   . Uterine cancer Sister   . Heart attack Brother   . Heart disease Brother   . Asthma Brother   . Breast cancer Neg Hx     Objective: Office vital signs reviewed. BP 112/78   Pulse 98   Temp (!) 96.9 F (36.1 C) (Temporal)   Ht 5\' 3"  (1.6 m)   Wt 143 lb (64.9 kg)   SpO2 97%   BMI 25.33 kg/m   Physical Examination:  General: Awake, alert, well appearing elderly female, No acute distress HEENT: Normal, sclera white, MMM Cardio: irregularly irregular. S1S2 heard, no murmurs appreciated Pulm: clear to auscultation bilaterally, no wheezes, rhonchi or rales; normal work of breathing on room air GI: soft, non-tender, non-distended, bowel sounds present x4, no hepatomegaly, no splenomegaly, no masses MSK: Ambulates with use of cane  Assessment/ Plan: 86 y.o. female   1. Hospital discharge follow-up I reviewed her discharge summary and recommendations.  She seems to be doing well and is asymptomatic.  I instructed her to continue the Augmentin as directed and follow-up with her gastroenterologist as recommended.  I will also CC this chart to them for recommendations on her IBS-D - Basic Metabolic Panel - CBC with Differential  2. Proctitis - Basic Metabolic Panel - CBC with Differential  3. Stage  3b chronic kidney disease - Basic Metabolic Panel  4. Persistent atrial fibrillation ([place]) Plan for EKG with her cardiologist tomorrow.  I think that they will be doing cardioversion at some point  soon  5. Pruritus Trial of Claritin. - loratadine (CLARITIN) 10 MG tablet; Take 1 tablet (10 mg total) by mouth daily. (for itching)  Dispense: 30 tablet; Refill: 11  6. Irritable bowel syndrome with diarrhea Refractory to Lomotil, Florastor, as needed Imodium.  May be exacerbated by use of Augmentin and recent COVID-19 illness.  If she has not been on Xifaxan previously I wonder if she would be a good candidate for this.  We will reach out to her gastroenterologist for input.   Orders Placed This Encounter  Procedures  . Basic Metabolic Panel  . CBC with Differential   No orders of the defined types were placed in this encounter.    [name], DO [place] [phone]

## 2019-01-20 NOTE — Patient Instructions (Signed)
You are still in atrial fibrillation and your rate is controlled.  Keep your appointment for your EKG tomorrow.  Keep your follow-up with your cardiologist for further recommendations  I am glad to see that you are feeling better.  Can finish off your antibiotic as directed.  I will reach out to Dr. Henrene Pastor about your chronic diarrhea to make sure that this is addressed.  Make sure that you call his office to schedule an appointment as soon as possible.  It was recommended that you follow-up within the next 4 weeks.  I am hoping that the Covid vaccination will be available to you within the next several weeks.  You should start seeing advertisement about this once it is available to the public.  I do recommend that you get this vaccine, you are high risk for Covid complications.  You had labs performed today.  You will be contacted with the results of the labs once they are available, usually in the next 3 business days for routine lab work.  If you have an active my chart account, they will be released to your MyChart.  If you prefer to have these labs released to you via telephone, please let us know.  If you had a pap smear or biopsy performed, expect to be contacted in about 7-10 days.

## 2019-01-21 ENCOUNTER — Ambulatory Visit (HOSPITAL_COMMUNITY): Payer: Medicare Other | Attending: Cardiology

## 2019-01-21 DIAGNOSIS — I4819 Other persistent atrial fibrillation: Secondary | ICD-10-CM | POA: Insufficient documentation

## 2019-01-21 DIAGNOSIS — R079 Chest pain, unspecified: Secondary | ICD-10-CM | POA: Diagnosis not present

## 2019-01-21 LAB — CBC WITH DIFFERENTIAL/PLATELET
Basophils Absolute: 0 10*3/uL (ref 0.0–0.2)
Basos: 0 %
EOS (ABSOLUTE): 0.3 10*3/uL (ref 0.0–0.4)
Eos: 5 %
Hematocrit: 37.3 % (ref 34.0–46.6)
Hemoglobin: 12.3 g/dL (ref 11.1–15.9)
Immature Grans (Abs): 0 10*3/uL (ref 0.0–0.1)
Immature Granulocytes: 0 %
Lymphocytes Absolute: 1.1 10*3/uL (ref 0.7–3.1)
Lymphs: 20 %
MCH: 30.5 pg (ref 26.6–33.0)
MCHC: 33 g/dL (ref 31.5–35.7)
MCV: 93 fL (ref 79–97)
Monocytes Absolute: 0.6 10*3/uL (ref 0.1–0.9)
Monocytes: 11 %
Neutrophils Absolute: 3.4 10*3/uL (ref 1.4–7.0)
Neutrophils: 64 %
Platelets: 178 10*3/uL (ref 150–450)
RBC: 4.03 x10E6/uL (ref 3.77–5.28)
RDW: 13.1 % (ref 11.7–15.4)
WBC: 5.4 10*3/uL (ref 3.4–10.8)

## 2019-01-21 LAB — BASIC METABOLIC PANEL
BUN/Creatinine Ratio: 13 (ref 12–28)
BUN: 15 mg/dL (ref 8–27)
CO2: 19 mmol/L — ABNORMAL LOW (ref 20–29)
Calcium: 8.8 mg/dL (ref 8.7–10.3)
Chloride: 103 mmol/L (ref 96–106)
Creatinine, Ser: 1.17 mg/dL — ABNORMAL HIGH (ref 0.57–1.00)
GFR calc Af Amer: 49 mL/min/{1.73_m2} — ABNORMAL LOW (ref >59)
GFR calc non Af Amer: 43 mL/min/{1.73_m2} — ABNORMAL LOW (ref >59)
Glucose: 130 mg/dL — ABNORMAL HIGH (ref 65–99)
Potassium: 4.6 mmol/L (ref 3.5–5.2)
Sodium: 135 mmol/L (ref 134–144)

## 2019-01-22 ENCOUNTER — Ambulatory Visit (INDEPENDENT_AMBULATORY_CARE_PROVIDER_SITE_OTHER): Payer: Medicare Other | Admitting: *Deleted

## 2019-01-22 DIAGNOSIS — I472 Ventricular tachycardia: Secondary | ICD-10-CM

## 2019-01-22 DIAGNOSIS — I4729 Other ventricular tachycardia: Secondary | ICD-10-CM

## 2019-01-22 NOTE — Progress Notes (Signed)
I have spoken with the patient over the phone, will need earlier cardioversion. Please call the patient and have her see me this Friday to set up cardioversion

## 2019-01-23 LAB — CUP PACEART REMOTE DEVICE CHECK
Battery Remaining Longevity: 144 mo
Battery Remaining Percentage: 100 %
Brady Statistic RV Percent Paced: 1 %
Date Time Interrogation Session: 20210106103900
HighPow Impedance: 38 Ohm
Implantable Lead Implant Date: 20090120
Implantable Lead Location: 753860
Implantable Lead Model: 157
Implantable Lead Serial Number: 138237
Implantable Pulse Generator Implant Date: 20160126
Lead Channel Impedance Value: 643 Ohm
Lead Channel Pacing Threshold Amplitude: 0.8 V
Lead Channel Pacing Threshold Pulse Width: 0.5 ms
Lead Channel Setting Pacing Amplitude: 2.2 V
Lead Channel Setting Pacing Pulse Width: 0.5 ms
Lead Channel Setting Sensing Sensitivity: 0.6 mV
Pulse Gen Serial Number: 191956

## 2019-01-24 ENCOUNTER — Telehealth: Payer: Self-pay | Admitting: Family Medicine

## 2019-01-24 ENCOUNTER — Encounter: Payer: Self-pay | Admitting: Physician Assistant

## 2019-01-24 ENCOUNTER — Other Ambulatory Visit: Payer: Self-pay

## 2019-01-24 ENCOUNTER — Ambulatory Visit (INDEPENDENT_AMBULATORY_CARE_PROVIDER_SITE_OTHER): Payer: Medicare Other | Admitting: Physician Assistant

## 2019-01-24 VITALS — BP 130/78 | HR 87 | Ht 63.0 in | Wt 146.4 lb

## 2019-01-24 DIAGNOSIS — I25119 Atherosclerotic heart disease of native coronary artery with unspecified angina pectoris: Secondary | ICD-10-CM

## 2019-01-24 DIAGNOSIS — R079 Chest pain, unspecified: Secondary | ICD-10-CM

## 2019-01-24 DIAGNOSIS — J9610 Chronic respiratory failure, unspecified whether with hypoxia or hypercapnia: Secondary | ICD-10-CM

## 2019-01-24 DIAGNOSIS — Z79899 Other long term (current) drug therapy: Secondary | ICD-10-CM

## 2019-01-24 DIAGNOSIS — I4819 Other persistent atrial fibrillation: Secondary | ICD-10-CM | POA: Diagnosis not present

## 2019-01-24 DIAGNOSIS — Z95810 Presence of automatic (implantable) cardiac defibrillator: Secondary | ICD-10-CM

## 2019-01-24 DIAGNOSIS — N183 Chronic kidney disease, stage 3 unspecified: Secondary | ICD-10-CM

## 2019-01-24 DIAGNOSIS — E785 Hyperlipidemia, unspecified: Secondary | ICD-10-CM

## 2019-01-24 MED ORDER — AMLODIPINE BESYLATE 2.5 MG PO TABS: 2.5000 mg | ORAL_TABLET | Freq: Every day | ORAL | 3 refills | Status: DC

## 2019-01-24 NOTE — Patient Instructions (Addendum)
Medication Instructions:  START AMLODIPINE 2.5MG  DAILY  If you need a refill on your cardiac medications before your next appointment, please call your pharmacy.  Labwork: CBC AND BMET Monday 02-10-2019 BEFORE COVID TESTING.  Testing/Procedures: Your provider has recommended a cardioversion.   COVID SCREENING INFORMATION: You are scheduled for your COVID screening on 02-10-2019 at [facility] (old [facility]) [address] Stay in the RIGHT lane and proceed under the brick awning (NOT the tent) and tell them you are there for pre-procedure testing Do NOT bring any pets with you to the testing site   You are scheduled for a cardioversion on  at  with Dr. Sallyanne Kuster.   PLEASE ARRIVE AT 830AM. Please go to [facility] ([address] 2nd [place] Stay at .  There is free valet parking available.  Enter through the [place] not have any food or drink after midnight on .  You may take your medicines with a sip of water on the day of your procedure.   Hold the following medicines   DO NOT STOP YOUR ANTICOAGULANT (BLOOD THINNER) Eliquis  2.5 mg  Twice daily- YOU WILL NEED TO CONTINUE YOUR ANTICOAGULANT AFTER YOUR PROCEDURE UNTIL YOU ARE TOLD BY YOUR PROVIDER IT IS SAFE TO STOP  You will need someone to drive you home following your procedure and stay in the waiting room during your procedure. Failure to do so could result in your procedure being cancelled.   Every effort is made to have your procedure done on time. Occasionally there are emergencies that occur at the hospital that may cause delays.   Call the [place] office at [phone] if you have any questions, problems or concerns.   Electrical Cardioversion Electrical cardioversion is the delivery of a jolt of electricity to change the rhythm of the heart. Sticky patches or metal paddles are placed on the chest to deliver the electricity  from a device. This is done to restore a normal rhythm. A rhythm that is too fast or not regular keeps the heart from pumping well. Electrical cardioversion is done in an emergency if:   There is low or no blood pressure as a result of the heart rhythm.    Normal rhythm must be restored as fast as possible to protect the brain and heart from further damage.    It may save a life. Cardioversion may be done for heart rhythms that are not immediately life threatening, such as atrial fibrillation or flutter, in which:   The heart is beating too fast or is not regular.    Medicine to change the rhythm has not worked.    It is safe to wait in order to allow time for preparation.  Symptoms of the abnormal rhythm are bothersome.  The risk of stroke and other serious problems can be reduced.  LET [facility] CARE PROVIDER KNOW ABOUT:   Any allergies you have.  All medicines you are taking, including vitamins, herbs, eye drops, creams, and over-the-counter medicines.  Previous problems you or members of your family have had with the use of anesthetics.    Any blood disorders you have.    Previous surgeries you have had.    Medical conditions you have.   RISKS AND COMPLICATIONS  Generally, this is a safe procedure. However, problems can occur and include:   Breathing problems related to the anesthetic used.  A blood clot that  breaks free and travels to other parts of your body. This could cause a stroke or other problems. The risk of this is lowered by use of blood-thinning medicine (anticoagulant) prior to the procedure.  Cardiac arrest (rare).   BEFORE THE PROCEDURE   You may have tests to detect blood clots in your heart and to evaluate heart function.   You may start taking anticoagulants so your blood does not clot as easily.    Medicines may be given to help stabilize your heart rate and rhythm.   PROCEDURE  You will be given medicine through an IV tube to reduce  discomfort and make you sleepy (sedative).    An electrical shock will be delivered.   AFTER THE PROCEDURE Your heart rhythm will be watched to make sure it does not change. You will need someone to drive you home.  ]  Follow-Up: 02-25-2019 WITH HAO MENG, PA-C

## 2019-01-24 NOTE — Progress Notes (Signed)
Thank you. I saw [name] today, will proceed with cardioversion. I was wondering her eliquis dosage, she is on low dose eliquis. Her age is >82, her weight is 64 kg, Cr 1.17, any reason I should put her on higher dose eliquis prior to cardioversion?

## 2019-01-24 NOTE — Progress Notes (Signed)
Cardiology Office Note:    Date:  01/27/2019   ID:  [name], DOB 06/11/1931, MRN [id number]  PCP:  [name], DO  Cardiologist:  Sanda Klein, MD  Electrophysiologist:  None   Referring MD: [name], DO   Chief Complaint  Patient presents with  . Follow-up    seen for Dr. Sallyanne [name]    History of Present Illness:    [name] is a 86 y.o. female with a hx of catecholamine adrenergic polymorphic VT s/p single chamber ICD with last generator change out 2016 with [facility] [employer] device, paroxysmal atrial fibrillation, CAD s/p stents to LAD and left circumflex in 2003, orthostatic hypotension, HLD, DM 2, stage III CKD.  Patient was on disopyramide for years for her polymorphic VT, this medication was gradually discontinued due to side effect especially orthostatic hypotension and dry mouth.  In the past, she developed torsades with sotalol and diarrhea with clonidine.  There was some discussion of using amiodarone to prevent atrial fibrillation episode however she did not want to take it due to side effect profile. She was feeling poorly and had event monitor in July 2020 to see if there is a correlation between episodes of atrial fibrillation and her complaint.  No atrial fibrillation was recorded during the monitoring period, although she did have persistent sinus bradycardia.  Beta-blocker was reduced.  Previous heart monitor in September 2019 did suggest there was a correlation between episodic weakness and atrial fibrillation that lasted for 12 to 24 hours with good rate control.  His last seen by Dr. Sallyanne [name] in October 2020, device interrogation at the time showed single nonsustained episode of ventricular tachycardia of 25 beats.  More recently, patient was admitted in December with proctitis and abdominal pain.  CT of the abdomen concern for possible perianal abscess.  She completed a course of Augmentin and plan to have outpatient follow-up with GI  service.  Patient was also diagnosed earlier in December with COVID-19 and was treated with steroid.  I last saw the patient on 01/14/2019, she was feeling poorly and has increasing shortness of breath and occasional chest discomfort.   I recommended echocardiogram to assess her ejection fraction and a repeat device interrogation.  Device interrogation did not show any prolonged ventricular ectopy.  EKG obtained on that day showed she was still in atrial fibrillation.  I suspect some of her shortness of breath may be related to atrial fibrillation.  Since she just got out of the hospital at the time, I was hesitant to arrange for direct cardioversion and wished to reassess you in few weeks.  Since discharge, patient continued to have intermittent chest pain.  She also have intermittent shortness of breath as well.  This block occurs both at rest and with exertion.  It is unclear to me if her symptom is related to angina versus atrial fibrillation.  I discussed the case with DOD Dr. Gwenlyn Found, given her age, we prefer to manage her anginal symptoms medically.  We will proceed with cardioversion to see if this will improve her symptoms.  She was agreeable to proceed.  I will need to double check with Dr. Sallyanne [name] to see if Eliquis need to be adjusted.  She is on 2.5 mg Eliquis twice a day.  Her age is greater than 11, however her weight is 66 kg and her creatinine is less than 1.5.  She a technically qualify for 5 mg twice daily dosing.   Past Medical History:  Diagnosis Date  . AICD (automatic cardioverter/defibrillator) present   . Allergy    SESONAL  . Anxiety   . ARTHRITIS   . Arthritis   . Atrial fibrillation ([place])   . CAD (coronary artery disease)    a. history of cardiac arrest 1993;  b. s/p LAD/LCX stenting in 2003;  c. 07/2011 Cath: LM nl, LAD patent stent, D1 80ost, LCX patent stent, RCA min irregs;  d. 04/2012 MV: EF 66%, no ishcemia;  e. 12/2013 Echo: Ef 55%, no rwma, Gr 1 DD, triv AI/MR, mildly  dil LA;  f. 12/2013 Lexi MV: intermediate risk - apical ischemia and inf/infsept fixed defect, ? artifactual.  . CAD in native artery 12/31/2013   Previous stents to LAD and Ramus patent on cath today 12/31/13 also There is severe disease in the ostial first diagonal which is unchanged from most recent cardiac catheterization. The right coronary artery could not be engaged selectively but nonselective angiography showed no significant disease in the proximal and midsegment.     . Cataract    DENIES  . Chronic back pain   . Chronic sinus bradycardia   . CKD (chronic kidney disease), stage III   . Clotting disorder (HCC)    DVT  . COLITIS 12/02/2007   Qualifier: Diagnosis of  By: Nils Pyle CMA ([place]), [name]    . Diabetes mellitus without complication ([place])    DENIES  . Diverticulosis of colon (without mention of hemorrhage) 2007   Colonoscopy   . Esophageal stricture    a. 2012 s/p dil.  . Esophagitis, unspecified    a. 2012 EGD  . EXTERNAL HEMORRHOIDS   . GERD (gastroesophageal reflux disease)    omeprazole  . H/O hiatal hernia   . Hiatal hernia    a. 2012 EGD.  Marland Kitchen History of DVT in the past, not on Coumadin now    left  leg  . HYPERCHOLESTEROLEMIA   . Hypertension   . ICD (implantable cardiac defibrillator) in place    a. s/p initial ICD in 1993 in setting of cardiac arrest;  b. 01/2007 gen change: Guidant T135 Vitality DS VR single lead ICD.  . Macular degeneration    gets injecton in eye every 5 weeks- last injection - 05/03/2013   . Myocardial infarction ([place]) 1993  . Near syncope 05/22/2015  . Nephrolithiasis, just saw Dr [name]   . Orthostatic hypotension   . Peripheral vascular disease ([place])    ???  . RECTAL BLEEDING 12/03/2007   Qualifier: Diagnosis of  By: Sharlett Iles MD Byrd Hesselbach   . Unspecified gastritis and gastroduodenitis without mention of hemorrhage    a. 2003 EGD->not noted on 2012 EGD.  Marland Kitchen Unstable angina, neg MI, cath stable.maybe GI 12/30/2013    Past  Surgical History:  Procedure Laterality Date  . BREAST BIOPSY Left 11/2013  . CARDIAC CATHETERIZATION    . CARDIAC CATHETERIZATION  01/01/15   patent stents -there is severe disease in ostial 1st diag but without change.   Marland Kitchen CARDIAC DEFIBRILLATOR PLACEMENT  02/05/2007   Guidant  . CARDIOVASCULAR STRESS TEST  12/30/2013   abnormal  . COLONOSCOPY    . coronary stents     . CYSTOSCOPY WITH URETEROSCOPY Right 05/20/2013   Procedure: CYSTOSCOPY, RIGHT URETEROSCOPY STONE EXTRACTION, Insertion of right DOUBLE J STENT ;  Surgeon: Irine Seal, MD;  Location: [facility];  Service: Urology;  Laterality: Right;  . CYSTOSCOPY WITH URETEROSCOPY AND STENT PLACEMENT N/A 06/03/2013   Procedure: SECOND LOOK CYSTOSCOPY  WITH URETEROSCOPY  HOLMIUM LASER LITHO AND STONE EXTRACTION Sammie Bench ;  Surgeon: Malka So, MD;  Location: [facility];  Service: Urology;  Laterality: N/A;  . GIVENS CAPSULE STUDY N/A 10/30/2016   Procedure: GIVENS CAPSULE STUDY;  Surgeon: Irene Shipper, MD;  Location: [facility];  Service: Endoscopy;  Laterality: N/A;  NEEDS TO BE ADMITTED FOR OBSERVATION PT. HAS DEFIB  . HEMORROIDECTOMY  80's  . HOLMIUM LASER APPLICATION Right [id number]   Procedure: HOLMIUM LASER APPLICATION;  Surgeon: Irine Seal, MD;  Location: [facility];  Service: Urology;  Laterality: Right;  . HYSTEROSCOPY WITH D & C  09/02/2010   Procedure: DILATATION AND CURETTAGE (D&C) /HYSTEROSCOPY;  Surgeon: Margarette Asal;  Location: [facility];  Service: Gynecology;  Laterality: N/A;  Dilation and Curettage with Hysteroscopy and Polypectomy  . IMPLANTABLE CARDIOVERTER DEFIBRILLATOR (ICD) GENERATOR CHANGE N/A 02/10/2014   Procedure: ICD GENERATOR CHANGE;  Surgeon: Sanda Klein, MD;  Location: [facility];  Service: Cardiovascular;  Laterality: N/A;  . LEFT HEART CATHETERIZATION WITH CORONARY ANGIOGRAM N/A 07/28/2011   Procedure: LEFT HEART CATHETERIZATION WITH CORONARY ANGIOGRAM;  Surgeon: Lorretta Harp, MD;  Location: [facility];  Service:  Cardiovascular;  Laterality: N/A;  . LEFT HEART CATHETERIZATION WITH CORONARY ANGIOGRAM N/A 12/31/2013   Procedure: LEFT HEART CATHETERIZATION WITH CORONARY ANGIOGRAM;  Surgeon: Wellington Hampshire, MD;  Location: [facility] CATH LAB;  Service: Cardiovascular;  Laterality: N/A;    Current Medications: Current Meds  Medication Sig  . Calcium Citrate-Vitamin D (CITRACAL + D PO) Take 1 tablet by mouth daily.  . Cholecalciferol (VITAMIN D) 1000 UNITS capsule Take 2,000 Units by mouth daily.   . diphenoxylate-atropine (LOMOTIL) 2.5-0.025 MG tablet Take 1 tablet by mouth every 8 (eight) hours as needed for diarrhea or loose stools.  Marland Kitchen ELIQUIS 2.5 MG TABS tablet TAKE  (1)  TABLET TWICE A DAY.  Marland Kitchen HYDROcodone-acetaminophen (NORCO/VICODIN) 5-325 MG tablet Take 1-2 tablets by mouth every 6 (six) hours as needed for moderate pain.  Marland Kitchen loratadine (CLARITIN) 10 MG tablet Take 1 tablet (10 mg total) by mouth daily. (for itching)  . metoprolol tartrate (LOPRESSOR) 25 MG tablet TAKE  (1)  TABLET TWICE A DAY.  . mirtazapine (REMERON) 7.5 MG tablet TAKE ONE TABLET AT BEDTIME, TAPER OFF PAXIL  . Multiple Vitamins-Minerals (CENTRUM SILVER ULTRA WOMENS PO) Take 1 tablet by mouth daily.  . nitroGLYCERIN (NITROSTAT) 0.4 MG SL tablet Place 1 tablet (0.4 mg total) under the tongue every 5 (five) minutes as needed for chest pain.  . pantoprazole (PROTONIX) 40 MG tablet Take 40 mg by mouth daily.  Marland Kitchen PARoxetine (PAXIL) 20 MG tablet Take 20 mg by mouth daily.  . rosuvastatin (CRESTOR) 40 MG tablet TAKE (1/2) TABLET DAILY.  Marland Kitchen saccharomyces boulardii (FLORASTOR) 250 MG capsule Take 1 capsule (250 mg total) by mouth 2 (two) times daily.  Marland Kitchen torsemide (DEMADEX) 20 MG tablet Take 20 mg by mouth daily as needed (edema).  . Vitamin D, Ergocalciferol, (DRISDOL) 1.25 MG (50000 UT) CAPS capsule Take 1 capsule (50,000 Units total) by mouth every 7 (seven) days.     Allergies:   Sotalol, Bactrim [sulfamethoxazole-trimethoprim], Ciprofloxacin,  Actos [pioglitazone], Adhesive [tape], Contrast media [iodinated diagnostic agents], Latex, and Lipitor [atorvastatin]   Social History   Socioeconomic History  . Marital status: Widowed    Spouse name: Not on file  . Number of children: Not on file  . Years of education: Not on file  . Highest education level: Not on  file  Occupational History  . Not on file  Tobacco Use  . Smoking status: Never Smoker  . Smokeless tobacco: Never Used  Substance and Sexual Activity  . Alcohol use: No  . Drug use: No  . Sexual activity: Never    Birth control/protection: Post-menopausal  Other Topics Concern  . Not on file  Social History Narrative  . Not on file   Social Determinants of Health   Financial Resource Strain:   . Difficulty of Paying Living Expenses: Not on file  Food Insecurity:   . Worried About Charity fundraiser in the Last Year: Not on file  . Ran Out of Food in the Last Year: Not on file  Transportation Needs:   . Lack of Transportation (Medical): Not on file  . Lack of Transportation (Non-Medical): Not on file  Physical Activity:   . Days of Exercise per Week: Not on file  . Minutes of Exercise per Session: Not on file  Stress:   . Feeling of Stress : Not on file  Social Connections:   . Frequency of Communication with Friends and Family: Not on file  . Frequency of Social Gatherings with Friends and Family: Not on file  . Attends Religious Services: Not on file  . Active Member of Clubs or Organizations: Not on file  . Attends Archivist Meetings: Not on file  . Marital Status: Not on file     Family History: The patient's family history includes Asthma in her brother; Atrial fibrillation in her sister; Cancer in her daughter; Colon polyps in her daughter; Diabetes in her daughter; Heart attack in her brother and father; Heart disease in her brother; Hyperlipidemia in her sister; Macular degeneration in her sister; Osteoporosis in her sister; Other in  her father and mother; Stroke in her mother, sister, and sister; Uterine cancer in her sister. There is no history of Breast cancer.  ROS:   Please see the history of present illness.     All other systems reviewed and are negative.  EKGs/Labs/Other Studies Reviewed:    The following studies were reviewed today: Echo 01/21/2019 1. Left ventricular ejection fraction, by visual estimation, is 40 to 45%. The left ventricle has normal function. There is no left ventricular hypertrophy.  2. The left ventricle demonstrates global hypokinesis.  3. Global right ventricle has normal systolic function.The right ventricular size is normal. No increase in right ventricular wall thickness.  4. Left atrial size was mildly dilated.  5. Right atrial size was normal.  6. Mild mitral annular calcification.  7. The mitral valve is normal in structure. Mild mitral valve regurgitation. No evidence of mitral stenosis.  8. The tricuspid valve is normal in structure.  9. The aortic valve is normal in structure. Aortic valve regurgitation is mild. No evidence of aortic valve sclerosis or stenosis. 10. The pulmonic valve was normal in structure. Pulmonic valve regurgitation is trivial. 11. There is mild dilatation of the ascending aorta measuring 43 mm. 12. Moderately elevated pulmonary artery systolic pressure. 13. The tricuspid regurgitant velocity is 2.90 m/s, and with an assumed right atrial pressure of 8 mmHg, the estimated right ventricular systolic pressure is moderately elevated at 41.6 mmHg. 14. A pacer wire is visualized. 15. The inferior vena cava is normal in size with greater than 50% respiratory variability, suggesting right atrial pressure of 3 mmHg.  EKG:  EKG is ordered today.  The ekg ordered today demonstrates atrial fibrillation with poor R wave progression anterior  leads.  Recent Labs: 02/05/2018: TSH 3.439 01/09/2019: ALT 21; Magnesium 1.9 01/20/2019: BUN 15; Creatinine, Ser 1.17; Hemoglobin  12.3; Platelets 178; Potassium 4.6; Sodium 135  Recent Lipid Panel    Component Value Date/Time   CHOL 139 10/23/2018 0947   CHOL 140 07/31/2012 1019   TRIG 211 (H) 12/28/2018 1601   TRIG 115 06/07/2015 1111   TRIG 161 (H) 07/31/2012 1019   HDL 53 10/23/2018 0947   HDL 68 06/07/2015 1111   HDL 43 07/31/2012 1019   CHOLHDL 2.6 10/23/2018 0947   CHOLHDL 2.8 09/02/2010 0629   VLDL 34 09/02/2010 0629   LDLCALC 65 10/23/2018 0947   LDLCALC 65 09/09/2013 0841   LDLCALC 65 07/31/2012 1019    Physical Exam:    VS:  BP 130/78   Pulse 87   Ht 5\' 3"  (1.6 m)   Wt 146 lb 6.4 oz (66.4 kg)   SpO2 99%   BMI 25.93 kg/m     Wt Readings from Last 3 Encounters:  01/24/19 146 lb 6.4 oz (66.4 kg)  01/20/19 143 lb (64.9 kg)  01/14/19 142 lb 12.8 oz (64.8 kg)     GEN:  Well nourished, well developed in no acute distress HEENT: Normal NECK: No JVD; No carotid bruits LYMPHATICS: No lymphadenopathy CARDIAC: Irregularly irregular, no murmurs, rubs, gallops RESPIRATORY:  Clear to auscultation without rales, wheezing or rhonchi  ABDOMEN: Soft, non-tender, non-distended MUSCULOSKELETAL:  No edema; No deformity  SKIN: Warm and dry NEUROLOGIC:  Alert and oriented x 3 PSYCHIATRIC:  Normal affect   ASSESSMENT:    1. Chest pain of uncertain etiology   2. Persistent atrial fibrillation ([place])   3. Medication management   4. ICD (implantable cardioverter-defibrillator) in place   5. Chronic respiratory failure, unspecified whether with hypoxia or hypercapnia ([place])   6. Coronary artery disease involving native coronary artery of native heart with angina pectoris ([place])   7. Hyperlipidemia LDL goal <70   8. Stage 3 chronic kidney disease, unspecified whether stage 3a or 3b CKD    PLAN:    In order of problems listed above:  1. Chest pain: It is not clear if her chest pain is related to angina versus recurrent atrial fibrillation.  I discussed the case with DOD, we will proceed with outpatient  cardioversion.  However the remaining question is whether or not to increase her Eliquis to 5 mg twice daily, even though her age is greater than 1, however technically given her renal function and the weight, she qualify for higher dose of Eliquis prior to cardioversion.  Restart amlodipine 2.5 mg daily to help with her symptom  2. Persistent atrial fibrillation: She has been in atrial fibrillation for a minimum of 1 month now.  Likely related to recent Covid infection.  3. History of NSVT s/p ICD: Device did not show significant prolonged VT episode  4. Chronic respiratory failure: Related to recent Covid infection.  5. CAD: Recent echocardiogram showed a drop in ejection fraction down to 40-45%, at this time we recommend continue medical therapy.  6. Hyperlipidemia: On Crestor  7. CKD stage III: Stable on recent lab work   Medication Adjustments/Labs and Tests Ordered: Current medicines are reviewed at length with the patient today.  Concerns regarding medicines are outlined above.  Orders Placed This Encounter  Procedures  . BMET  . CBC  . EKG 12-Lead   Meds ordered this encounter  Medications  . amLODipine (NORVASC) 2.5 MG tablet    Sig: Take  1 tablet (2.5 mg total) by mouth daily.    Dispense:  30 tablet    Refill:  3    Patient Instructions  Medication Instructions:  START AMLODIPINE 2.5MG  DAILY  If you need a refill on your cardiac medications before your next appointment, please call your pharmacy.  Labwork: CBC AND BMET Monday 02-10-2019 BEFORE COVID TESTING.  Testing/Procedures: Your provider has recommended a cardioversion.   COVID SCREENING INFORMATION: You are scheduled for your COVID screening on 02-10-2019 at [facility] (old [facility]) [address] Stay in the RIGHT lane and proceed under the brick awning (NOT the tent) and tell them you are there for pre-procedure testing Do NOT bring any pets with you to the  testing site   You are scheduled for a cardioversion on  at  with Dr. Sallyanne [name].   PLEASE ARRIVE AT 830AM. Please go to [facility] ([address]) [address] [place] Stay at .  There is free valet parking available.  Enter through the [place] not have any food or drink after midnight on .  You may take your medicines with a sip of water on the day of your procedure.   Hold the following medicines   DO NOT STOP YOUR ANTICOAGULANT (BLOOD THINNER) Eliquis  2.5 mg  Twice daily- YOU WILL NEED TO CONTINUE YOUR ANTICOAGULANT AFTER YOUR PROCEDURE UNTIL YOU ARE TOLD BY YOUR PROVIDER IT IS SAFE TO STOP  You will need someone to drive you home following your procedure and stay in the waiting room during your procedure. Failure to do so could result in your procedure being cancelled.   Every effort is made to have your procedure done on time. Occasionally there are emergencies that occur at the hospital that may cause delays.   Call the [place] office at [phone] if you have any questions, problems or concerns.   Electrical Cardioversion Electrical cardioversion is the delivery of a jolt of electricity to change the rhythm of the heart. Sticky patches or metal paddles are placed on the chest to deliver the electricity from a device. This is done to restore a normal rhythm. A rhythm that is too fast or not regular keeps the heart from pumping well. Electrical cardioversion is done in an emergency if:   There is low or no blood pressure as a result of the heart rhythm.    Normal rhythm must be restored as fast as possible to protect the brain and heart from further damage.    It may save a life. Cardioversion may be done for heart rhythms that are not immediately life threatening, such as atrial fibrillation or flutter, in which:   The heart is beating too fast or is not regular.    Medicine to change the rhythm has not worked.    It is  safe to wait in order to allow time for preparation.  Symptoms of the abnormal rhythm are bothersome.  The risk of stroke and other serious problems can be reduced.  LET [facility] CARE PROVIDER KNOW ABOUT:   Any allergies you have.  All medicines you are taking, including vitamins, herbs, eye drops, creams, and over-the-counter medicines.  Previous problems you or members of your family have had with the use of anesthetics.    Any blood disorders you have.    Previous surgeries you have had.    Medical conditions you have.   RISKS AND  COMPLICATIONS  Generally, this is a safe procedure. However, problems can occur and include:   Breathing problems related to the anesthetic used.  A blood clot that breaks free and travels to other parts of your body. This could cause a stroke or other problems. The risk of this is lowered by use of blood-thinning medicine (anticoagulant) prior to the procedure.  Cardiac arrest (rare).   BEFORE THE PROCEDURE   You may have tests to detect blood clots in your heart and to evaluate heart function.   You may start taking anticoagulants so your blood does not clot as easily.    Medicines may be given to help stabilize your heart rate and rhythm.   PROCEDURE  You will be given medicine through an IV tube to reduce discomfort and make you sleepy (sedative).    An electrical shock will be delivered.   AFTER THE PROCEDURE Your heart rhythm will be watched to make sure it does not change. You will need someone to drive you home.  ]  Follow-Up: 02-25-2019 WITH Eileen Kangas, PA-C       Signed, Al[place]ra Deforest, [place]  01/27/2019 12:04 AM    [place]

## 2019-01-24 NOTE — H&P (View-Only) (Signed)
Cardiology Office Note:    Date:  01/27/2019   ID:  [name], DOB [birth year], MRN [id number]  PCP:  [name], DO  Cardiologist:  Sanda Klein, MD  Electrophysiologist:  None   Referring MD: [name], DO   Chief Complaint  Patient presents with   Follow-up    seen for Dr. Sallyanne Kuster    History of Present Illness:    [name] is a 86 y.o. female with a hx of catecholamine adrenergic polymorphic VT s/p single chamber ICD with last generator change out 2016 with [facility] [employer] device, paroxysmal atrial fibrillation, CAD s/p stents to LAD and left circumflex in 2003, orthostatic hypotension, HLD, DM 2, stage III CKD.  Patient was on disopyramide for years for her polymorphic VT, this medication was gradually discontinued due to side effect especially orthostatic hypotension and dry mouth.  In the past, she developed torsades with sotalol and diarrhea with clonidine.  There was some discussion of using amiodarone to prevent atrial fibrillation episode however she did not want to take it due to side effect profile. She was feeling poorly and had event monitor in July 2020 to see if there is a correlation between episodes of atrial fibrillation and her complaint.  No atrial fibrillation was recorded during the monitoring period, although she did have persistent sinus bradycardia.  Beta-blocker was reduced.  Previous heart monitor in September 2019 did suggest there was a correlation between episodic weakness and atrial fibrillation that lasted for 12 to 24 hours with good rate control.  His last seen by Dr. Sallyanne Kuster in October 2020, device interrogation at the time showed single nonsustained episode of ventricular tachycardia of 25 beats.  More recently, patient was admitted in December with proctitis and abdominal pain.  CT of the abdomen concern for possible perianal abscess.  She completed a course of Augmentin and plan to have outpatient follow-up with GI  service.  Patient was also diagnosed earlier in December with COVID-19 and was treated with steroid.  I last saw the patient on 01/14/2019, she was feeling poorly and has increasing shortness of breath and occasional chest discomfort.   I recommended echocardiogram to assess her ejection fraction and a repeat device interrogation.  Device interrogation did not show any prolonged ventricular ectopy.  EKG obtained on that day showed she was still in atrial fibrillation.  I suspect some of her shortness of breath may be related to atrial fibrillation.  Since she just got out of the hospital at the time, I was hesitant to arrange for direct cardioversion and wished to reassess you in few weeks.  Since discharge, patient continued to have intermittent chest pain.  She also have intermittent shortness of breath as well.  This block occurs both at rest and with exertion.  It is unclear to me if her symptom is related to angina versus atrial fibrillation.  I discussed the case with DOD Dr. Gwenlyn Found, given her age, we prefer to manage her anginal symptoms medically.  We will proceed with cardioversion to see if this will improve her symptoms.  She was agreeable to proceed.  I will need to double check with Dr. Sallyanne Kuster to see if Eliquis need to be adjusted.  She is on 2.5 mg Eliquis twice a day.  Her age is greater than 66, however her weight is 66 kg and her creatinine is less than 1.5.  She a technically qualify for 5 mg twice daily dosing.   Past Medical History:  Diagnosis Date   AICD (automatic cardioverter/defibrillator) present    Allergy    SESONAL   Anxiety    ARTHRITIS    Arthritis    Atrial fibrillation (HCC)    CAD (coronary artery disease)    a. history of cardiac arrest 1993;  b. s/p LAD/LCX stenting in 2003;  c. 07/2011 Cath: LM nl, LAD patent stent, D1 80ost, LCX patent stent, RCA min irregs;  d. 04/2012 MV: EF 66%, no ishcemia;  e. 12/2013 Echo: Ef 55%, no rwma, Gr 1 DD, triv AI/MR, mildly  dil LA;  f. 12/2013 Lexi MV: intermediate risk - apical ischemia and inf/infsept fixed defect, ? artifactual.   CAD in native artery 12/31/2013   Previous stents to LAD and Ramus patent on cath today 12/31/13 also There is severe disease in the ostial first diagonal which is unchanged from most recent cardiac catheterization. The right coronary artery could not be engaged selectively but nonselective angiography showed no significant disease in the proximal and midsegment.      Cataract    DENIES   Chronic back pain    Chronic sinus bradycardia    CKD (chronic kidney disease), stage III    Clotting disorder ([name])    DVT   COLITIS 12/02/2007   Qualifier: Diagnosis of  By: [name], [name]     Diabetes mellitus without complication ([name])    DENIES   Diverticulosis of colon (without mention of hemorrhage) 2007   Colonoscopy    Esophageal stricture    a. 2012 s/p dil.   Esophagitis, unspecified    a. 2012 EGD   EXTERNAL HEMORRHOIDS    GERD (gastroesophageal reflux disease)    omeprazole   H/O hiatal hernia    Hiatal hernia    a. 2012 EGD.   History of DVT in the past, not on Coumadin now    left  leg   HYPERCHOLESTEROLEMIA    Hypertension    ICD (implantable cardiac defibrillator) in place    a. s/p initial ICD in 1993 in setting of cardiac arrest;  b. 01/2007 gen change: Guidant T135 Vitality DS VR single lead ICD.   Macular degeneration    gets injecton in eye every 5 weeks- last injection - 05/03/2013    Myocardial infarction [facility]) 1993   Near syncope 05/22/2015   Nephrolithiasis, just saw Dr [name]    Orthostatic hypotension    Peripheral vascular disease ([place])    ???   RECTAL BLEEDING 12/03/2007   Qualifier: Diagnosis of  By: [name]    Unspecified gastritis and gastroduodenitis without mention of hemorrhage    a. 2003 EGD->not noted on 2012 EGD.   Unstable angina, neg MI, cath stable.maybe GI 12/30/2013    Past  Surgical History:  Procedure Laterality Date   BREAST BIOPSY Left 11/2013   CARDIAC CATHETERIZATION     CARDIAC CATHETERIZATION  01/01/15   patent stents -there is severe disease in ostial 1st diag but without change.    CARDIAC DEFIBRILLATOR PLACEMENT  02/05/2007   Guidant   CARDIOVASCULAR STRESS TEST  12/30/2013   abnormal   COLONOSCOPY     coronary stents      CYSTOSCOPY WITH URETEROSCOPY Right 05/20/2013   Procedure: CYSTOSCOPY, RIGHT URETEROSCOPY STONE EXTRACTION, Insertion of right DOUBLE J STENT ;  Surgeon: Irine Seal, MD;  Location: [facility];  Service: Urology;  Laterality: Right;   CYSTOSCOPY WITH URETEROSCOPY AND STENT PLACEMENT N/A 06/03/2013   Procedure: SECOND LOOK CYSTOSCOPY  WITH URETEROSCOPY  HOLMIUM LASER LITHO AND STONE EXTRACTION Sammie Bench ;  Surgeon: Malka So, MD;  Location: [facility];  Service: Urology;  Laterality: N/A;   GIVENS CAPSULE STUDY N/A 10/30/2016   Procedure: GIVENS CAPSULE STUDY;  Surgeon: Irene Shipper, MD;  Location: [facility] ENDOSCOPY;  Service: Endoscopy;  Laterality: N/A;  NEEDS TO BE ADMITTED FOR OBSERVATION PT. HAS DEFIB   HEMORROIDECTOMY  80's   HOLMIUM LASER APPLICATION Right [id number]   Procedure: HOLMIUM LASER APPLICATION;  Surgeon: Irine Seal, MD;  Location: [facility];  Service: Urology;  Laterality: Right;   HYSTEROSCOPY WITH D & C  09/02/2010   Procedure: DILATATION AND CURETTAGE (D&C) /HYSTEROSCOPY;  Surgeon: Margarette Asal;  Location: [place] [facility];  Service: Gynecology;  Laterality: N/A;  Dilation and Curettage with Hysteroscopy and Polypectomy   IMPLANTABLE CARDIOVERTER DEFIBRILLATOR (ICD) GENERATOR CHANGE N/A 02/10/2014   Procedure: ICD GENERATOR CHANGE;  Surgeon: Sanda Klein, MD;  Location: [place] CATH LAB;  Service: Cardiovascular;  Laterality: N/A;   LEFT HEART CATHETERIZATION WITH CORONARY ANGIOGRAM N/A 07/28/2011   Procedure: LEFT HEART CATHETERIZATION WITH CORONARY ANGIOGRAM;  Surgeon: Lorretta Harp, MD;  Location: [facility] CATH LAB;  Service:  Cardiovascular;  Laterality: N/A;   LEFT HEART CATHETERIZATION WITH CORONARY ANGIOGRAM N/A 12/31/2013   Procedure: LEFT HEART CATHETERIZATION WITH CORONARY ANGIOGRAM;  Surgeon: Wellington Hampshire, MD;  Location: [place] CATH LAB;  Service: Cardiovascular;  Laterality: N/A;    Current Medications: Current Meds  Medication Sig   Calcium Citrate-Vitamin D (CITRACAL + D PO) Take 1 tablet by mouth daily.   Cholecalciferol (VITAMIN D) 1000 UNITS capsule Take 2,000 Units by mouth daily.    diphenoxylate-atropine (LOMOTIL) 2.5-0.025 MG tablet Take 1 tablet by mouth every 8 (eight) hours as needed for diarrhea or loose stools.   ELIQUIS 2.5 MG TABS tablet TAKE  (1)  TABLET TWICE A DAY.   HYDROcodone-acetaminophen (NORCO/VICODIN) 5-325 MG tablet Take 1-2 tablets by mouth every 6 (six) hours as needed for moderate pain.   loratadine (CLARITIN) 10 MG tablet Take 1 tablet (10 mg total) by mouth daily. (for itching)   metoprolol tartrate (LOPRESSOR) 25 MG tablet TAKE  (1)  TABLET TWICE A DAY.   mirtazapine (REMERON) 7.5 MG tablet TAKE ONE TABLET AT BEDTIME, TAPER OFF PAXIL   Multiple Vitamins-Minerals (CENTRUM SILVER ULTRA WOMENS PO) Take 1 tablet by mouth daily.   nitroGLYCERIN (NITROSTAT) 0.4 MG SL tablet Place 1 tablet (0.4 mg total) under the tongue every 5 (five) minutes as needed for chest pain.   pantoprazole (PROTONIX) 40 MG tablet Take 40 mg by mouth daily.   PARoxetine (PAXIL) 20 MG tablet Take 20 mg by mouth daily.   rosuvastatin (CRESTOR) 40 MG tablet TAKE (1/2) TABLET DAILY.   saccharomyces boulardii (FLORASTOR) 250 MG capsule Take 1 capsule (250 mg total) by mouth 2 (two) times daily.   torsemide (DEMADEX) 20 MG tablet Take 20 mg by mouth daily as needed (edema).   Vitamin D, Ergocalciferol, (DRISDOL) 1.25 MG (50000 UT) CAPS capsule Take 1 capsule (50,000 Units total) by mouth every 7 (seven) days.     Allergies:   Sotalol, Bactrim [sulfamethoxazole-trimethoprim], Ciprofloxacin,  Actos [pioglitazone], Adhesive [tape], Contrast media [iodinated diagnostic agents], Latex, and Lipitor [atorvastatin]   Social History   Socioeconomic History   Marital status: Widowed    Spouse name: Not on file   Number of children: Not on file   Years of education: Not on file   Highest education level: Not on  file  Occupational History   Not on file  Tobacco Use   Smoking status: Never Smoker   Smokeless tobacco: Never Used  Substance and Sexual Activity   Alcohol use: No   Drug use: No   Sexual activity: Never    Birth control/protection: Post-menopausal  Other Topics Concern   Not on file  Social History Narrative   Not on file   Social Determinants of Health   Financial Resource Strain:    Difficulty of Paying Living Expenses: Not on file  Food Insecurity:    Worried About [place] in the Last Year: Not on file   Ran Out of Food in the Last Year: Not on file  Transportation Needs:    Lack of Transportation (Medical): Not on file   Lack of Transportation (Non-Medical): Not on file  Physical Activity:    Days of Exercise per Week: Not on file   Minutes of Exercise per Session: Not on file  Stress:    Feeling of Stress : Not on file  Social Connections:    Frequency of Communication with Friends and Family: Not on file   Frequency of Social Gatherings with Friends and Family: Not on file   Attends Religious Services: Not on file   Active Member of Clubs or Organizations: Not on file   Attends Archivist Meetings: Not on file   Marital Status: Not on file     Family History: The patient's family history includes Asthma in her brother; Atrial fibrillation in her sister; Cancer in her daughter; Colon polyps in her daughter; Diabetes in her daughter; Heart attack in her brother and father; Heart disease in her brother; Hyperlipidemia in her sister; Macular degeneration in her sister; Osteoporosis in her sister; Other in  her father and mother; Stroke in her mother, sister, and sister; Uterine cancer in her sister. There is no history of Breast cancer.  ROS:   Please see the history of present illness.     All other systems reviewed and are negative.  EKGs/Labs/Other Studies Reviewed:    The following studies were reviewed today: Echo 01/21/2019 1. Left ventricular ejection fraction, by visual estimation, is 40 to 45%. The left ventricle has normal function. There is no left ventricular hypertrophy.  2. The left ventricle demonstrates global hypokinesis.  3. Global right ventricle has normal systolic function.The right ventricular size is normal. No increase in right ventricular wall thickness.  4. Left atrial size was mildly dilated.  5. Right atrial size was normal.  6. Mild mitral annular calcification.  7. The mitral valve is normal in structure. Mild mitral valve regurgitation. No evidence of mitral stenosis.  8. The tricuspid valve is normal in structure.  9. The aortic valve is normal in structure. Aortic valve regurgitation is mild. No evidence of aortic valve sclerosis or stenosis. 10. The pulmonic valve was normal in structure. Pulmonic valve regurgitation is trivial. 11. There is mild dilatation of the ascending aorta measuring 43 mm. 12. Moderately elevated pulmonary artery systolic pressure. 13. The tricuspid regurgitant velocity is 2.90 m/s, and with an assumed right atrial pressure of 8 mmHg, the estimated right ventricular systolic pressure is moderately elevated at 41.6 mmHg. 14. A pacer wire is visualized. 15. The inferior vena cava is normal in size with greater than 50% respiratory variability, suggesting right atrial pressure of 3 mmHg.  EKG:  EKG is ordered today.  The ekg ordered today demonstrates atrial fibrillation with poor R wave progression anterior  leads.  Recent Labs: 02/05/2018: TSH 3.439 01/09/2019: ALT 21; Magnesium 1.9 01/20/2019: BUN 15; Creatinine, Ser 1.17; Hemoglobin  12.3; Platelets 178; Potassium 4.6; Sodium 135  Recent Lipid Panel    Component Value Date/Time   CHOL 139 10/23/2018 0947   CHOL 140 07/31/2012 1019   TRIG 211 (H) 12/28/2018 1601   TRIG 115 06/07/2015 1111   TRIG 161 (H) 07/31/2012 1019   HDL 53 10/23/2018 0947   HDL 68 06/07/2015 1111   HDL 43 07/31/2012 1019   CHOLHDL 2.6 10/23/2018 0947   CHOLHDL 2.8 09/02/2010 0629   VLDL 34 09/02/2010 0629   LDLCALC 65 10/23/2018 0947   LDLCALC 65 09/09/2013 0841   LDLCALC 65 07/31/2012 1019    Physical Exam:    VS:  BP 130/78    Pulse 87    Ht 5\' 3"  (1.6 m)    Wt 146 lb 6.4 oz (66.4 kg)    SpO2 99%    BMI 25.93 kg/m     Wt Readings from Last 3 Encounters:  01/24/19 146 lb 6.4 oz (66.4 kg)  01/20/19 143 lb (64.9 kg)  01/14/19 142 lb 12.8 oz (64.8 kg)     GEN:  Well nourished, well developed in no acute distress HEENT: Normal NECK: No JVD; No carotid bruits LYMPHATICS: No lymphadenopathy CARDIAC: Irregularly irregular, no murmurs, rubs, gallops RESPIRATORY:  Clear to auscultation without rales, wheezing or rhonchi  ABDOMEN: Soft, non-tender, non-distended MUSCULOSKELETAL:  No edema; No deformity  SKIN: Warm and dry NEUROLOGIC:  Alert and oriented x 3 PSYCHIATRIC:  Normal affect   ASSESSMENT:    1. Chest pain of uncertain etiology   2. Persistent atrial fibrillation ([name])   3. Medication management   4. ICD (implantable cardioverter-defibrillator) in place   5. Chronic respiratory failure, unspecified whether with hypoxia or hypercapnia ([name])   6. Coronary artery disease involving native coronary artery of native heart with angina pectoris ([name])   7. Hyperlipidemia LDL goal <70   8. Stage 3 chronic kidney disease, unspecified whether stage 3a or 3b CKD    PLAN:    In order of problems listed above:  1. Chest pain: It is not clear if her chest pain is related to angina versus recurrent atrial fibrillation.  I discussed the case with DOD, we will proceed with outpatient  cardioversion.  However the remaining question is whether or not to increase her Eliquis to 5 mg twice daily, even though her age is greater than 84, however technically given her renal function and the weight, she qualify for higher dose of Eliquis prior to cardioversion.  Restart amlodipine 2.5 mg daily to help with her symptom  2. Persistent atrial fibrillation: She has been in atrial fibrillation for a minimum of 1 month now.  Likely related to recent Covid infection.  3. History of NSVT s/p ICD: Device did not show significant prolonged VT episode  4. Chronic respiratory failure: Related to recent Covid infection.  5. CAD: Recent echocardiogram showed a drop in ejection fraction down to 40-45%, at this time we recommend continue medical therapy.  6. Hyperlipidemia: On Crestor  7. CKD stage III: Stable on recent lab work   Medication Adjustments/Labs and Tests Ordered: Current medicines are reviewed at length with the patient today.  Concerns regarding medicines are outlined above.  Orders Placed This Encounter  Procedures   BMET   CBC   EKG 12-Lead   Meds ordered this encounter  Medications   amLODipine (NORVASC) 2.5 MG tablet  Sig: Take 1 tablet (2.5 mg total) by mouth daily.    Dispense:  30 tablet    Refill:  3    Patient Instructions  Medication Instructions:  START AMLODIPINE 2.5MG  DAILY  If you need a refill on your cardiac medications before your next appointment, please call your pharmacy.  Labwork: CBC AND BMET Monday 02-10-2019 BEFORE COVID TESTING.  Testing/Procedures: Your provider has recommended a cardioversion.   COVID SCREENING INFORMATION: You are scheduled for your COVID screening on 02-10-2019 at [facility] (old [facility]) [address] Stay in the RIGHT lane and proceed under the brick awning (NOT the tent) and tell them you are there for pre-procedure testing Do NOT bring any pets with you to the  testing site   You are scheduled for a cardioversion on  at  with Dr. Sallyanne Kuster.   PLEASE ARRIVE AT 830AM. Please go to [facility] ([address]) 2nd [place] Stay at .  There is free valet parking available.  Enter through the [place] not have any food or drink after midnight on .  You may take your medicines with a sip of water on the day of your procedure.   Hold the following medicines   DO NOT STOP YOUR ANTICOAGULANT (BLOOD THINNER) Eliquis  2.5 mg  Twice daily- YOU WILL NEED TO CONTINUE YOUR ANTICOAGULANT AFTER YOUR PROCEDURE UNTIL YOU ARE TOLD BY YOUR PROVIDER IT IS SAFE TO STOP  You will need someone to drive you home following your procedure and stay in the waiting room during your procedure. Failure to do so could result in your procedure being cancelled.   Every effort is made to have your procedure done on time. Occasionally there are emergencies that occur at the hospital that may cause delays.   Call the [place] office at [phone] if you have any questions, problems or concerns.   Electrical Cardioversion Electrical cardioversion is the delivery of a jolt of electricity to change the rhythm of the heart. Sticky patches or metal paddles are placed on the chest to deliver the electricity from a device. This is done to restore a normal rhythm. A rhythm that is too fast or not regular keeps the heart from pumping well. Electrical cardioversion is done in an emergency if:   There is low or no blood pressure as a result of the heart rhythm.    Normal rhythm must be restored as fast as possible to protect the brain and heart from further damage.    It may save a life. Cardioversion may be done for heart rhythms that are not immediately life threatening, such as atrial fibrillation or flutter, in which:   The heart is beating too fast or is not regular.    Medicine to change the rhythm has not worked.    It is  safe to wait in order to allow time for preparation.  Symptoms of the abnormal rhythm are bothersome.  The risk of stroke and other serious problems can be reduced.  LET [facility] CARE PROVIDER KNOW ABOUT:   Any allergies you have.  All medicines you are taking, including vitamins, herbs, eye drops, creams, and over-the-counter medicines.  Previous problems you or members of your family have had with the use of anesthetics.    Any blood disorders you have.    Previous surgeries you have had.    Medical conditions you have.  RISKS AND COMPLICATIONS  Generally, this is a safe procedure. However, problems can occur and include:   Breathing problems related to the anesthetic used.  A blood clot that breaks free and travels to other parts of your body. This could cause a stroke or other problems. The risk of this is lowered by use of blood-thinning medicine (anticoagulant) prior to the procedure.  Cardiac arrest (rare).   BEFORE THE PROCEDURE   You may have tests to detect blood clots in your heart and to evaluate heart function.   You may start taking anticoagulants so your blood does not clot as easily.    Medicines may be given to help stabilize your heart rate and rhythm.   PROCEDURE  You will be given medicine through an IV tube to reduce discomfort and make you sleepy (sedative).    An electrical shock will be delivered.   AFTER THE PROCEDURE Your heart rhythm will be watched to make sure it does not change. You will need someone to drive you home.  ]  Follow-Up: 02-25-2019 WITH Flordia Kassem, PA-C       Signed, Almyra Deforest, [place]  01/27/2019 12:04 AM    [place]

## 2019-01-27 ENCOUNTER — Telehealth: Payer: Self-pay | Admitting: Family Medicine

## 2019-01-27 ENCOUNTER — Encounter: Payer: Self-pay | Admitting: Physician Assistant

## 2019-01-27 NOTE — Telephone Encounter (Signed)
Patient aware and verbalized understanding. °

## 2019-01-27 NOTE — Progress Notes (Signed)
Yes, [name], I think you are right.  We should increase her Eliquis back to 5 mg twice daily.  It was decreased last year when her creatinine was consistently 1.5-1.9, but her creatinine has improved substantially. Schedule for elective cardioversion after 3 weeks on the higher dose.

## 2019-01-28 ENCOUNTER — Ambulatory Visit: Payer: Self-pay | Admitting: Family Medicine

## 2019-02-03 ENCOUNTER — Ambulatory Visit: Payer: Medicare Other | Admitting: Physician Assistant

## 2019-02-07 ENCOUNTER — Other Ambulatory Visit: Payer: Self-pay

## 2019-02-07 ENCOUNTER — Other Ambulatory Visit: Payer: Medicare Other

## 2019-02-08 LAB — BASIC METABOLIC PANEL
BUN/Creatinine Ratio: 10 — ABNORMAL LOW (ref 12–28)
BUN: 10 mg/dL (ref 8–27)
CO2: 21 mmol/L (ref 20–29)
Calcium: 9.4 mg/dL (ref 8.7–10.3)
Chloride: 102 mmol/L (ref 96–106)
Creatinine, Ser: 1.03 mg/dL — ABNORMAL HIGH (ref 0.57–1.00)
GFR calc Af Amer: 57 mL/min/{1.73_m2} — ABNORMAL LOW (ref >59)
GFR calc non Af Amer: 50 mL/min/{1.73_m2} — ABNORMAL LOW (ref >59)
Glucose: 129 mg/dL — ABNORMAL HIGH (ref 65–99)
Potassium: 4 mmol/L (ref 3.5–5.2)
Sodium: 139 mmol/L (ref 134–144)

## 2019-02-08 LAB — CBC
Hematocrit: 36 % (ref 34.0–46.6)
Hemoglobin: 11.8 g/dL (ref 11.1–15.9)
MCH: 30.2 pg (ref 26.6–33.0)
MCHC: 32.8 g/dL (ref 31.5–35.7)
MCV: 92 fL (ref 79–97)
Platelets: 268 10*3/uL (ref 150–450)
RBC: 3.91 x10E6/uL (ref 3.77–5.28)
RDW: 13.6 % (ref 11.7–15.4)
WBC: 7.3 10*3/uL (ref 3.4–10.8)

## 2019-02-10 ENCOUNTER — Other Ambulatory Visit (HOSPITAL_COMMUNITY): Payer: Medicare Other

## 2019-02-10 NOTE — Progress Notes (Signed)
   patient's Covid test is + in December results are in patients chart.  Patient will not require a repeat covid test if procedure is within next 90 days of positive result

## 2019-02-13 ENCOUNTER — Ambulatory Visit (HOSPITAL_COMMUNITY)
Admission: RE | Admit: 2019-02-13 | Discharge: 2019-02-13 | Disposition: A | Discharge status: Home / Self Care | Payer: Medicare Other | Attending: Cardiovascular Disease | Admitting: Cardiovascular Disease

## 2019-02-13 ENCOUNTER — Ambulatory Visit (HOSPITAL_COMMUNITY): Payer: Medicare Other

## 2019-02-13 ENCOUNTER — Other Ambulatory Visit: Payer: Self-pay

## 2019-02-13 ENCOUNTER — Encounter (HOSPITAL_COMMUNITY)
Admission: RE | Disposition: A | Discharge status: Home / Self Care | Payer: Self-pay | Source: Home / Self Care | Attending: Cardiovascular Disease

## 2019-02-13 DIAGNOSIS — Z79899 Other long term (current) drug therapy: Secondary | ICD-10-CM | POA: Diagnosis not present

## 2019-02-13 DIAGNOSIS — I25119 Atherosclerotic heart disease of native coronary artery with unspecified angina pectoris: Secondary | ICD-10-CM | POA: Diagnosis not present

## 2019-02-13 DIAGNOSIS — E1122 Type 2 diabetes mellitus with diabetic chronic kidney disease: Secondary | ICD-10-CM | POA: Insufficient documentation

## 2019-02-13 DIAGNOSIS — E7800 Pure hypercholesterolemia, unspecified: Secondary | ICD-10-CM | POA: Insufficient documentation

## 2019-02-13 DIAGNOSIS — H3530 Unspecified macular degeneration: Secondary | ICD-10-CM | POA: Insufficient documentation

## 2019-02-13 DIAGNOSIS — I129 Hypertensive chronic kidney disease with stage 1 through stage 4 chronic kidney disease, or unspecified chronic kidney disease: Secondary | ICD-10-CM | POA: Diagnosis not present

## 2019-02-13 DIAGNOSIS — I4819 Other persistent atrial fibrillation: Secondary | ICD-10-CM

## 2019-02-13 DIAGNOSIS — E785 Hyperlipidemia, unspecified: Secondary | ICD-10-CM | POA: Diagnosis not present

## 2019-02-13 DIAGNOSIS — F419 Anxiety disorder, unspecified: Secondary | ICD-10-CM | POA: Insufficient documentation

## 2019-02-13 DIAGNOSIS — Z95810 Presence of automatic (implantable) cardiac defibrillator: Secondary | ICD-10-CM | POA: Diagnosis not present

## 2019-02-13 DIAGNOSIS — D689 Coagulation defect, unspecified: Secondary | ICD-10-CM | POA: Diagnosis not present

## 2019-02-13 DIAGNOSIS — Z955 Presence of coronary angioplasty implant and graft: Secondary | ICD-10-CM | POA: Diagnosis not present

## 2019-02-13 DIAGNOSIS — K219 Gastro-esophageal reflux disease without esophagitis: Secondary | ICD-10-CM | POA: Insufficient documentation

## 2019-02-13 DIAGNOSIS — J9610 Chronic respiratory failure, unspecified whether with hypoxia or hypercapnia: Secondary | ICD-10-CM | POA: Insufficient documentation

## 2019-02-13 DIAGNOSIS — Z86718 Personal history of other venous thrombosis and embolism: Secondary | ICD-10-CM | POA: Insufficient documentation

## 2019-02-13 DIAGNOSIS — R001 Bradycardia, unspecified: Secondary | ICD-10-CM | POA: Diagnosis not present

## 2019-02-13 DIAGNOSIS — R079 Chest pain, unspecified: Secondary | ICD-10-CM | POA: Diagnosis not present

## 2019-02-13 DIAGNOSIS — N183 Chronic kidney disease, stage 3 unspecified: Secondary | ICD-10-CM | POA: Insufficient documentation

## 2019-02-13 DIAGNOSIS — M1990 Unspecified osteoarthritis, unspecified site: Secondary | ICD-10-CM | POA: Diagnosis not present

## 2019-02-13 DIAGNOSIS — Z7901 Long term (current) use of anticoagulants: Secondary | ICD-10-CM | POA: Insufficient documentation

## 2019-02-13 DIAGNOSIS — I252 Old myocardial infarction: Secondary | ICD-10-CM | POA: Diagnosis not present

## 2019-02-13 HISTORY — PX: CARDIOVERSION: SHX1299

## 2019-02-13 SURGERY — CARDIOVERSION
Anesthesia: General

## 2019-02-13 MED ORDER — PROPOFOL 10 MG/ML IV BOLUS
INTRAVENOUS | Status: DC | PRN
  Administered 2019-02-13: 50 mg via INTRAVENOUS

## 2019-02-13 MED ORDER — LIDOCAINE 2% (20 MG/ML) 5 ML SYRINGE
INTRAMUSCULAR | Status: DC | PRN
  Administered 2019-02-13: 60 mg via INTRAVENOUS

## 2019-02-13 MED ORDER — SODIUM CHLORIDE 0.9 % IV SOLN
INTRAVENOUS | Status: DC | PRN
  Administered 2019-02-13: 500 mL via INTRAVENOUS

## 2019-02-13 NOTE — Anesthesia Preprocedure Evaluation (Addendum)
Anesthesia Evaluation  Patient identified by MRN, date of birth, ID band Patient awake    Reviewed: Allergy & Precautions, H&P , NPO status , Patient's Chart, lab work & pertinent test results, reviewed documented beta blocker date and time   Airway Mallampati: II  TM Distance: >3 FB Neck ROM: Full    Dental no notable dental hx. (+) Teeth Intact, Dental Advisory Given   Pulmonary neg pulmonary ROS,    Pulmonary exam normal breath sounds clear to auscultation       Cardiovascular hypertension, Pt. on medications and Pt. on home beta blockers + CAD, + Past MI, + Cardiac Stents and + Peripheral Vascular Disease  + dysrhythmias Atrial Fibrillation + Cardiac Defibrillator  Rhythm:Irregular Rate:Normal     Neuro/Psych Anxiety negative neurological ROS     GI/Hepatic Neg liver ROS, hiatal hernia, GERD  Medicated and Controlled,  Endo/Other  negative endocrine ROSdiabetes  Renal/GU Renal InsufficiencyRenal disease  negative genitourinary   Musculoskeletal  (+) Arthritis ,   Abdominal   Peds  Hematology  (+) Blood dyscrasia, anemia ,   Anesthesia Other Findings   Reproductive/Obstetrics negative OB ROS                            Anesthesia Physical Anesthesia Plan  ASA: III  Anesthesia Plan: General   Post-op Pain Management:    Induction: Intravenous  PONV Risk Score and Plan: 3 and Treatment may vary due to age or medical condition and Propofol infusion  Airway Management Planned: Mask  Additional Equipment:   Intra-op Plan:   Post-operative Plan:   Informed Consent: I have reviewed the patients History and Physical, chart, labs and discussed the procedure including the risks, benefits and alternatives for the proposed anesthesia with the patient or authorized representative who has indicated his/her understanding and acceptance.     Dental advisory given  Plan Discussed with:  CRNA  Anesthesia Plan Comments:         Anesthesia Quick Evaluation

## 2019-02-13 NOTE — Transfer of Care (Signed)
Immediate Anesthesia Transfer of Care Note  Patient: [name]  Procedure(s) Performed: CARDIOVERSION (N/A )  Patient Location: Endoscopy Unit  Anesthesia Type:General  Level of Consciousness: drowsy and patient cooperative  Airway & Oxygen Therapy: Patient Spontanous Breathing  Post-op Assessment: Report given to RN, Post -op Vital signs reviewed and stable and Patient moving all extremities X 4  Post vital signs: Reviewed and stable  Last Vitals:  Vitals Value Taken Time  BP    Temp    Pulse    Resp    SpO2      Last Pain:  Vitals:   02/13/19 0924  TempSrc: Oral  PainSc:          Complications: No apparent anesthesia complications

## 2019-02-13 NOTE — Op Note (Signed)
Procedure: Electrical Cardioversion Indications:  Atrial Fibrillation  Procedure Details:  Consent: Risks of procedure as well as the alternatives and risks of each were explained to the (patient/caregiver).  Consent for procedure obtained.  Time Out: Verified patient identification, verified procedure, site/side was marked, verified correct patient position, special equipment/implants available, medications/allergies/relevent history reviewed, required imaging and test results available.  Performed  Patient placed on cardiac monitor, pulse oximetry, supplemental oxygen as necessary.  Sedation given: Propofol 50 mg IV, Dr. Ola Spurr Pacer pads placed anterior and posterior chest.  Cardioverted 1 time(s).  Cardioversion with synchronized biphasic 120J shock.  Evaluation: Findings: Post procedure EKG shows: NSR w 1st degree AV block Complications: None Patient did tolerate procedure well.  Time Spent Directly with the Patient:  30 minutes   Geroldine Esquivias 02/13/2019, 9:51 AM

## 2019-02-13 NOTE — Anesthesia Postprocedure Evaluation (Signed)
Anesthesia Post Note  Patient: [name]  Procedure(s) Performed: CARDIOVERSION (N/A )     Patient location during evaluation: Endoscopy Anesthesia Type: General Level of consciousness: awake and alert Pain management: pain level controlled Vital Signs Assessment: post-procedure vital signs reviewed and stable Respiratory status: spontaneous breathing, nonlabored ventilation and respiratory function stable Cardiovascular status: blood pressure returned to baseline and stable Postop Assessment: no apparent nausea or vomiting Anesthetic complications: no    Last Vitals:  Vitals:   02/13/19 1005 02/13/19 1020  BP: 129/68 (!) 153/78  Pulse: 61 61  Resp: 11 14  Temp:    SpO2: 99% 98%    Last Pain:  Vitals:   02/13/19 1020  TempSrc:   PainSc: 0-No pain                 Edelyn Heidel,W. EDMOND

## 2019-02-13 NOTE — Interval H&P Note (Signed)
History and Physical Interval Note:  02/13/2019 9:05 AM  [name]  has presented today for surgery, with the diagnosis of AFIB.  The various methods of treatment have been discussed with the patient and family. After consideration of risks, benefits and other options for treatment, the patient has consented to  Procedure(s): CARDIOVERSION (N/A) as a surgical intervention.  The patient's history has been reviewed, patient examined, no change in status, stable for surgery.  I have reviewed the patient's chart and labs.  Questions were answered to the patient's satisfaction.     Brittany Osier

## 2019-02-13 NOTE — Discharge Instructions (Signed)
Electrical Cardioversion   What can I expect after the procedure?  Your blood pressure, heart rate, breathing rate, and blood oxygen level will be monitored until you leave the hospital or clinic.  Your heart rhythm will be watched to make sure it does not change.  You may have some redness on the skin where the shocks were given. Follow these instructions at home:  Do not drive for 24 hours if you were given a sedative during your procedure.  Take over-the-counter and prescription medicines only as told by your health care provider.  Ask your health care provider how to check your pulse. Check it often.  Rest for 48 hours after the procedure or as told by your health care provider.  Avoid or limit your caffeine use as told by your health care provider.  Keep all follow-up visits as told by your health care provider. This is important. Contact a health care provider if:  You feel like your heart is beating too quickly or your pulse is not regular.  You have a serious muscle cramp that does not go away. Get help right away if:  You have discomfort in your chest.  You are dizzy or you feel faint.  You have trouble breathing or you are short of breath.  Your speech is slurred.  You have trouble moving an arm or leg on one side of your body.  Your fingers or toes turn cold or blue. Summary  Electrical cardioversion is the delivery of a jolt of electricity to restore a normal rhythm to the heart.  This procedure may be done right away in an emergency or may be a scheduled procedure if the condition is not an emergency.  Generally, this is a safe procedure.  After the procedure, check your pulse often as told by your health care provider. This information is not intended to replace advice given to you by your health care provider. Make sure you discuss any questions you have with your health care provider. Document Revised: 08/05/2018 Document Reviewed:  08/05/2018 Elsevier Patient Education  2020 Elsevier Inc.  

## 2019-02-17 NOTE — Progress Notes (Signed)
Cardiology Office Note   Date:  02/27/2019   ID:  [name], DOB 24-Aug-1931, MRN [id number]  PCP:  [name], DO  Cardiologist:  Sanda Klein, MD EP: None  Chief Complaint  Patient presents with  . Atrial Fibrillation      History of Present Illness: [name] is a 86 y.o. female with a PMH of CAD s/p PCI to LAD/LCx in 2003, chronic combined CHF, catecholamine adrenergic polymorphic VT s/p ICD with generator change out in 2016, paroxysmal atrial fibrillation, orthostatic hypotension, HLD, DM type 2, CKD stage 3, who presents for post-cardioversion follow-up.  She was last evaluated by cardiology at an outpatient visit with Almyra Deforest, PA-C 01/24/19, at which time she had complaints of occasional chest pain and SOB. Echo 01/21/19 showed EF 40-45%, global hypokinesis, mild LAE, and mild MR/AI. It was felt drop in EF could be 2/2 progression of CAD, however she has declined invasive procedures, therefore decision was made to medically manage her chest pain by starting amlodipine 2.5mg  daily. She was noted to be in persistent atrial fibrillation since COVID-19 infection 12/2018, and she was recommended to undergo DCCV. She underwent successful DCCV with Dr. Sallyanne Kuster 02/13/19 with return to NSR with 1st degree AV block. Her last device interrogation 01/21/19 did not show any significant VT episodes.   Unfortunately she presented to the ED 02/21/19 with complaints of recurrent atrial fibrillation. She was given po and IV metoprolol for rate control with improvement in rate to 90s at discharge. She presents today for post-cardioversion and ED visit follow-up. She remains in atrial fibrillation and has frequent episodes of tachypalpitations where she feels lightheaded. This generally improves with sitting/laying down. She has occasional chest tightness when her heart rates are elevated which improve with 1 SL nitro. She has taken about 5 tablets in the past week. She denies orthopnea,  PND, LE edema, or syncope. We discussed options for management of her atrial fibrillation are limited due to underlying CAD. We discussed starting amiodarone which she was hesitant to do given side effects. Suggested a referral to the atrial fibrillation clinic for further assistance with management which she was agreeable to.    Past Medical History:  Diagnosis Date  . AICD (automatic cardioverter/defibrillator) present   . Allergy    SESONAL  . Anxiety   . ARTHRITIS   . Arthritis   . Atrial fibrillation ([place])   . CAD (coronary artery disease)    a. history of cardiac arrest 1993;  b. s/p LAD/LCX stenting in 2003;  c. 07/2011 Cath: LM nl, LAD patent stent, D1 80ost, LCX patent stent, RCA min irregs;  d. 04/2012 MV: EF 66%, no ishcemia;  e. 12/2013 Echo: Ef 55%, no rwma, Gr 1 DD, triv AI/MR, mildly dil LA;  f. 12/2013 Lexi MV: intermediate risk - apical ischemia and inf/infsept fixed defect, ? artifactual.  . CAD in native artery 12/31/2013   Previous stents to LAD and Ramus patent on cath today 12/31/13 also There is severe disease in the ostial first diagonal which is unchanged from most recent cardiac catheterization. The right coronary artery could not be engaged selectively but nonselective angiography showed no significant disease in the proximal and midsegment.     . Cataract    DENIES  . Chronic back pain   . Chronic sinus bradycardia   . CKD (chronic kidney disease), stage III   . Clotting disorder (HCC)    DVT  . COLITIS 12/02/2007   Qualifier:  Diagnosis of  By: Nils Pyle CMA ([place]), [name]    . Diabetes mellitus without complication ([place])    DENIES  . Diverticulosis of colon (without mention of hemorrhage) 2007   Colonoscopy   . Esophageal stricture    a. 2012 s/p dil.  . Esophagitis, unspecified    a. 2012 EGD  . EXTERNAL HEMORRHOIDS   . GERD (gastroesophageal reflux disease)    omeprazole  . H/O hiatal hernia   . Hiatal hernia    a. 2012 EGD.  Marland Kitchen History of DVT in the  past, not on Coumadin now    left  leg  . HYPERCHOLESTEROLEMIA   . Hypertension   . ICD (implantable cardiac defibrillator) in place    a. s/p initial ICD in 1993 in setting of cardiac arrest;  b. 01/2007 gen change: Guidant T135 Vitality DS VR single lead ICD.  . Macular degeneration    gets injecton in eye every 5 weeks- last injection - 05/03/2013   . Myocardial infarction ([place]) 1993  . Near syncope 05/22/2015  . Nephrolithiasis, just saw Dr [name]   . Orthostatic hypotension   . Peripheral vascular disease ([place])    ???  . RECTAL BLEEDING 12/03/2007   Qualifier: Diagnosis of  By: Sharlett Iles MD Byrd Hesselbach   . Unspecified gastritis and gastroduodenitis without mention of hemorrhage    a. 2003 EGD->not noted on 2012 EGD.  Marland Kitchen Unstable angina, neg MI, cath stable.maybe GI 12/30/2013    Past Surgical History:  Procedure Laterality Date  . BREAST BIOPSY Left 11/2013  . CARDIAC CATHETERIZATION    . CARDIAC CATHETERIZATION  01/01/15   patent stents -there is severe disease in ostial 1st diag but without change.   Marland Kitchen CARDIAC DEFIBRILLATOR PLACEMENT  02/05/2007   Guidant  . CARDIOVASCULAR STRESS TEST  12/30/2013   abnormal  . CARDIOVERSION N/A 02/13/2019   Procedure: CARDIOVERSION;  Surgeon: Sanda Klein, MD;  Location: [facility];  Service: Cardiovascular;  Laterality: N/A;  . COLONOSCOPY    . coronary stents     . CYSTOSCOPY WITH URETEROSCOPY Right 05/20/2013   Procedure: CYSTOSCOPY, RIGHT URETEROSCOPY STONE EXTRACTION, Insertion of right DOUBLE J STENT ;  Surgeon: Irine Seal, MD;  Location: [facility];  Service: Urology;  Laterality: Right;  . CYSTOSCOPY WITH URETEROSCOPY AND STENT PLACEMENT N/A 06/03/2013   Procedure: SECOND LOOK CYSTOSCOPY WITH URETEROSCOPY  HOLMIUM LASER LITHO AND STONE EXTRACTION Sammie Bench ;  Surgeon: Malka So, MD;  Location: [facility];  Service: Urology;  Laterality: N/A;  . GIVENS CAPSULE STUDY N/A 10/30/2016   Procedure: GIVENS CAPSULE STUDY;  Surgeon: Irene Shipper, MD;  Location: [facility];  Service: Endoscopy;  Laterality: N/A;  NEEDS TO BE ADMITTED FOR OBSERVATION PT. HAS DEFIB  . HEMORROIDECTOMY  80's  . HOLMIUM LASER APPLICATION Right [id number]   Procedure: HOLMIUM LASER APPLICATION;  Surgeon: Irine Seal, MD;  Location: [facility];  Service: Urology;  Laterality: Right;  . HYSTEROSCOPY WITH D & C  09/02/2010   Procedure: DILATATION AND CURETTAGE (D&C) /HYSTEROSCOPY;  Surgeon: Margarette Asal;  Location: [facility];  Service: Gynecology;  Laterality: N/A;  Dilation and Curettage with Hysteroscopy and Polypectomy  . IMPLANTABLE CARDIOVERTER DEFIBRILLATOR (ICD) GENERATOR CHANGE N/A 02/10/2014   Procedure: ICD GENERATOR CHANGE;  Surgeon: Sanda Klein, MD;  Location: [facility];  Service: Cardiovascular;  Laterality: N/A;  . LEFT HEART CATHETERIZATION WITH CORONARY ANGIOGRAM N/A 07/28/2011   Procedure: LEFT HEART CATHETERIZATION WITH CORONARY ANGIOGRAM;  Surgeon: Pearletha Forge  Gwenlyn Found, MD;  Location: [facility];  Service: Cardiovascular;  Laterality: N/A;  . LEFT HEART CATHETERIZATION WITH CORONARY ANGIOGRAM N/A 12/31/2013   Procedure: LEFT HEART CATHETERIZATION WITH CORONARY ANGIOGRAM;  Surgeon: Wellington Hampshire, MD;  Location: [facility];  Service: Cardiovascular;  Laterality: N/A;     Current Outpatient Medications  Medication Sig Dispense Refill  . amLODipine (NORVASC) 2.5 MG tablet Take 1 tablet (2.5 mg total) by mouth daily. 30 tablet 3  . Calcium Citrate-Vitamin D (CITRACAL + D PO) Take 1 tablet by mouth every evening.     . carboxymethylcellulose (REFRESH PLUS) 0.5 % SOLN Place 1 drop into both eyes 3 (three) times daily as needed (dry/irritated eyes.).    Marland Kitchen diphenoxylate-atropine (LOMOTIL) 2.5-0.025 MG tablet Take 1 tablet by mouth every 8 (eight) hours as needed for diarrhea or loose stools. 30 tablet 1  . ELIQUIS 2.5 MG TABS tablet TAKE  (1)  TABLET TWICE A DAY. (Patient taking differently: Take 2.5 mg by mouth 2 (two) times daily. ) 180 tablet  1  . isosorbide mononitrate (IMDUR) 30 MG 24 hr tablet Take 0.5 tablets (15 mg total) by mouth daily. 15 tablet 6  . loratadine (CLARITIN) 10 MG tablet Take 1 tablet (10 mg total) by mouth daily. (for itching) (Patient taking differently: Take 10 mg by mouth daily as needed ((for itching)). ) 30 tablet 11  . metoprolol tartrate (LOPRESSOR) 25 MG tablet Take 1 tablet (25 mg) daily as needed for elevated for heart rate 30 tablet 3  . metoprolol tartrate (LOPRESSOR) 50 MG tablet Take 1 tablet (50 mg total) by mouth 2 (two) times daily. 60 tablet 3  . mirtazapine (REMERON) 7.5 MG tablet TAKE ONE TABLET AT BEDTIME, TAPER OFF PAXIL 30 tablet 0  . Multiple Vitamins-Minerals (CENTRUM SILVER ULTRA WOMENS PO) Take 1 tablet by mouth daily.    . nitroGLYCERIN (NITROSTAT) 0.4 MG SL tablet Place 1 tablet (0.4 mg total) under the tongue every 5 (five) minutes as needed for chest pain. 25 tablet 2  . pantoprazole (PROTONIX) 40 MG tablet Take 1 tablet (40 mg total) by mouth every evening. 90 tablet 3  . PARoxetine (PAXIL) 20 MG tablet TAKE (1) TABLET DAILY IN THE MORNING. 90 tablet 0  . rosuvastatin (CRESTOR) 40 MG tablet TAKE (1/2) TABLET DAILY. (Patient taking differently: Take 20 mg by mouth at bedtime. ) 45 tablet 0  . torsemide (DEMADEX) 20 MG tablet Take 20 mg by mouth daily as needed (edema).    . Vitamin D, Ergocalciferol, (DRISDOL) 1.25 MG (50000 UNIT) CAPS capsule Take 1 capsule (50,000 Units total) by mouth every 7 (seven) days. 8 capsule 0   No current facility-administered medications for this visit.    Allergies:   Sotalol, Bactrim [sulfamethoxazole-trimethoprim], Ciprofloxacin, Actos [pioglitazone], Adhesive [tape], Contrast media [iodinated diagnostic agents], Latex, and Lipitor [atorvastatin]    Social History:  The patient  reports that she has never smoked. She has never used smokeless tobacco. She reports that she does not drink alcohol or use drugs.   Family History:  The patient's family  history includes Asthma in her brother; Atrial fibrillation in her sister; Cancer in her daughter; Colon polyps in her daughter; Diabetes in her daughter; Heart attack in her brother and father; Heart disease in her brother; Hyperlipidemia in her sister; Macular degeneration in her sister; Osteoporosis in her sister; Other in her father and mother; Stroke in her mother, sister, and sister; Uterine cancer in her sister.    ROS:  Please see the history of present illness.   Otherwise, review of systems are positive for none.   All other systems are reviewed and negative.    PHYSICAL EXAM: VS:  BP (!) 142/80   Pulse 82   Temp (!) 97.1 F (36.2 C)   Ht 5\' 3"  (1.6 m)   Wt 145 lb (65.8 kg)   SpO2 90%   BMI 25.69 kg/m  , BMI Body mass index is 25.69 kg/m. GEN: Well nourished, well developed, in no acute distress HEENT: normal Neck: no JVD, carotid bruits, or masses Cardiac: IRIR; no murmurs, rubs, or gallops,no edema  Respiratory:  clear to auscultation bilaterally, normal work of breathing GI: soft, nontender, nondistended, + BS MS: no deformity or atrophy Skin: warm and dry, no rash Neuro:  Strength and sensation are intact Psych: euthymic mood, full affect   EKG:  EKG is ordered today. The ekg ordered today demonstrates atrial fibrillation with rate 83 bpm, no STE/D, and non-specific IVCD; no significant change from previous.    Recent Labs: 01/09/2019: ALT 21; Magnesium 1.9 02/21/2019: BUN 17; Creatinine, Ser 1.24; Hemoglobin 11.9; Platelets 274; Potassium 4.1; Sodium 135    Lipid Panel    Component Value Date/Time   CHOL 139 10/23/2018 0947   CHOL 140 07/31/2012 1019   TRIG 211 (H) 12/28/2018 1601   TRIG 115 06/07/2015 1111   TRIG 161 (H) 07/31/2012 1019   HDL 53 10/23/2018 0947   HDL 68 06/07/2015 1111   HDL 43 07/31/2012 1019   CHOLHDL 2.6 10/23/2018 0947   CHOLHDL 2.8 09/02/2010 0629   VLDL 34 09/02/2010 0629   LDLCALC 65 10/23/2018 0947   LDLCALC 65 09/09/2013  0841   LDLCALC 65 07/31/2012 1019      Wt Readings from Last 3 Encounters:  02/27/19 145 lb (65.8 kg)  02/26/19 144 lb (65.3 kg)  02/21/19 140 lb (63.5 kg)      Other studies Reviewed: Additional studies/ records that were reviewed today include:   Echocardiogram 01/21/19: 1. Left ventricular ejection fraction, by visual estimation, is 40 to  45%. The left ventricle has normal function. There is no left ventricular  hypertrophy.  2. The left ventricle demonstrates global hypokinesis.  3. Global right ventricle has normal systolic function.The right  ventricular size is normal. No increase in right ventricular wall  thickness.  4. Left atrial size was mildly dilated.  5. Right atrial size was normal.  6. Mild mitral annular calcification.  7. The mitral valve is normal in structure. Mild mitral valve  regurgitation. No evidence of mitral stenosis.  8. The tricuspid valve is normal in structure.  9. The aortic valve is normal in structure. Aortic valve regurgitation is  mild. No evidence of aortic valve sclerosis or stenosis.  10. The pulmonic valve was normal in structure. Pulmonic valve  regurgitation is trivial.  11. There is mild dilatation of the ascending aorta measuring 43 mm.  12. Moderately elevated pulmonary artery systolic pressure.  13. The tricuspid regurgitant velocity is 2.90 m/s, and with an assumed  right atrial pressure of 8 mmHg, the estimated right ventricular systolic  pressure is moderately elevated at 41.6 mmHg.  14. A pacer wire is visualized.  15. The inferior vena cava is normal in size with greater than 50%  respiratory variability, suggesting right atrial pressure of 3 mmHg.     ASSESSMENT AND PLAN:  1. Persistent atrial fibrillation: recent successful DCCV 02/13/19, with recurrent atrial fibrillation since that time. Rate 80s  today - Continue eliquis for stroke ppx - will increase to 5mg  BID as she does not meet criteria for adjusted  dose.  - Continue metoprolol - hesitant to uptitrate given occasional HR's in the 50s, therefore will prescribe metoprolol tartrate 25mg  mg to be taken once daily as needed for elevated heart rates. - Will refer her to the atrial fibrillation clinic for assistance with Afib management. She is hesitant to start amiodarone, though I feel this is her best option at maintaining sinus rhythm if she were to pursue another cardioversion  2. Chronic combined CHF: suspected tachycardia mediated cardiomyopathy 2/2 #1. EF 40-45% on echo 01/21/19.  - Continue metoprolol - Continue prn torsemide.  3. CAD s/p PCI LAD/LCx in 2003: She has some anginal complaints associated with fast heart rates which are relieved with 1 SL nitro - she has taken 5 doses in the past week. Not on aspirin given need for anticoagulation. She is not interested in invasive testing - Will start imdur 15 mg daily for antianginal effects - can uptitrate if she is still requiring intermittent SL nitro - Continue statin - Continue metoprolol and amlodipine  4. VT s/p ICD: no significant episodes on last device check 01/21/19 - Anticipate next device check in 04/2019  5. HLD: LDL 65 10/2018; at goal of <70 - Continue statin   Current medicines are reviewed at length with the patient today.  The patient does not have concerns regarding medicines.  The following changes have been made:  As above  Labs/ tests ordered today include:   Orders Placed This Encounter  Procedures  . Amb Referral to AFIB Clinic  . EKG 12-Lead     Disposition:   FU with Dr. Sallyanne Kuster, Isaac Laud, or myself in 1 month  Signed, Abigail Butts, PA-C  02/27/2019 6:20 PM

## 2019-02-18 ENCOUNTER — Other Ambulatory Visit: Payer: Self-pay | Admitting: Family Medicine

## 2019-02-18 DIAGNOSIS — E559 Vitamin D deficiency, unspecified: Secondary | ICD-10-CM

## 2019-02-20 ENCOUNTER — Other Ambulatory Visit: Payer: Self-pay | Admitting: Family Medicine

## 2019-02-21 ENCOUNTER — Telehealth: Payer: Self-pay | Admitting: Cardiovascular Disease

## 2019-02-21 ENCOUNTER — Other Ambulatory Visit: Payer: Self-pay

## 2019-02-21 ENCOUNTER — Encounter (HOSPITAL_COMMUNITY): Payer: Self-pay | Admitting: *Deleted

## 2019-02-21 ENCOUNTER — Emergency Department (HOSPITAL_COMMUNITY): Payer: Medicare Other

## 2019-02-21 ENCOUNTER — Other Ambulatory Visit: Payer: Self-pay | Admitting: *Deleted

## 2019-02-21 ENCOUNTER — Emergency Department (HOSPITAL_COMMUNITY)
Admission: EM | Admit: 2019-02-21 | Discharge: 2019-02-22 | Disposition: A | Discharge status: Home / Self Care | Payer: Medicare Other | Attending: Emergency Medicine | Admitting: Emergency Medicine

## 2019-02-21 DIAGNOSIS — R42 Dizziness and giddiness: Secondary | ICD-10-CM | POA: Insufficient documentation

## 2019-02-21 DIAGNOSIS — Z91040 Latex allergy status: Secondary | ICD-10-CM | POA: Insufficient documentation

## 2019-02-21 DIAGNOSIS — Z79899 Other long term (current) drug therapy: Secondary | ICD-10-CM | POA: Diagnosis not present

## 2019-02-21 DIAGNOSIS — I480 Paroxysmal atrial fibrillation: Secondary | ICD-10-CM | POA: Insufficient documentation

## 2019-02-21 DIAGNOSIS — R609 Edema, unspecified: Secondary | ICD-10-CM | POA: Diagnosis not present

## 2019-02-21 DIAGNOSIS — I129 Hypertensive chronic kidney disease with stage 1 through stage 4 chronic kidney disease, or unspecified chronic kidney disease: Secondary | ICD-10-CM | POA: Diagnosis not present

## 2019-02-21 DIAGNOSIS — Z95811 Presence of heart assist device: Secondary | ICD-10-CM | POA: Diagnosis not present

## 2019-02-21 DIAGNOSIS — Z7901 Long term (current) use of anticoagulants: Secondary | ICD-10-CM | POA: Insufficient documentation

## 2019-02-21 DIAGNOSIS — N183 Chronic kidney disease, stage 3 unspecified: Secondary | ICD-10-CM | POA: Insufficient documentation

## 2019-02-21 DIAGNOSIS — E119 Type 2 diabetes mellitus without complications: Secondary | ICD-10-CM | POA: Diagnosis not present

## 2019-02-21 DIAGNOSIS — I2510 Atherosclerotic heart disease of native coronary artery without angina pectoris: Secondary | ICD-10-CM | POA: Insufficient documentation

## 2019-02-21 DIAGNOSIS — R009 Unspecified abnormalities of heart beat: Secondary | ICD-10-CM | POA: Diagnosis present

## 2019-02-21 LAB — CBC
HCT: 37.8 % (ref 36.0–46.0)
Hemoglobin: 11.9 g/dL — ABNORMAL LOW (ref 12.0–15.0)
MCH: 30.4 pg (ref 26.0–34.0)
MCHC: 31.5 g/dL (ref 30.0–36.0)
MCV: 96.7 fL (ref 80.0–100.0)
Platelets: 274 10*3/uL (ref 150–400)
RBC: 3.91 MIL/uL (ref 3.87–5.11)
RDW: 14.2 % (ref 11.5–15.5)
WBC: 5.8 10*3/uL (ref 4.0–10.5)
nRBC: 0 % (ref 0.0–0.2)

## 2019-02-21 LAB — BASIC METABOLIC PANEL
Anion gap: 9 (ref 5–15)
BUN: 17 mg/dL (ref 8–23)
CO2: 24 mmol/L (ref 22–32)
Calcium: 9.4 mg/dL (ref 8.9–10.3)
Chloride: 102 mmol/L (ref 98–111)
Creatinine, Ser: 1.24 mg/dL — ABNORMAL HIGH (ref 0.44–1.00)
GFR calc Af Amer: 46 mL/min — ABNORMAL LOW (ref >60)
GFR calc non Af Amer: 40 mL/min — ABNORMAL LOW (ref >60)
Glucose, Bld: 157 mg/dL — ABNORMAL HIGH (ref 70–99)
Potassium: 4.1 mmol/L (ref 3.5–5.1)
Sodium: 135 mmol/L (ref 135–145)

## 2019-02-21 MED ORDER — METOPROLOL TARTRATE 5 MG/5ML IV SOLN
2.5000 mg | Freq: Once | INTRAVENOUS | Status: AC
  Administered 2019-02-21: 2.5 mg via INTRAVENOUS
  Filled 2019-02-21: qty 5

## 2019-02-21 MED ORDER — METOPROLOL TARTRATE 25 MG PO TABS
25.0000 mg | ORAL_TABLET | Freq: Once | ORAL | Status: AC
  Administered 2019-02-21: 25 mg via ORAL
  Filled 2019-02-21: qty 1

## 2019-02-21 MED ORDER — PANTOPRAZOLE SODIUM 40 MG PO TBEC: 40.0000 mg | DELAYED_RELEASE_TABLET | Freq: Every evening | ORAL | 3 refills | Status: DC

## 2019-02-21 MED ORDER — SODIUM CHLORIDE 0.9% FLUSH: 3.0000 mL | Freq: Once | INTRAVENOUS | Status: DC

## 2019-02-21 MED ORDER — APIXABAN 2.5 MG PO TABS
2.5000 mg | ORAL_TABLET | Freq: Once | ORAL | Status: DC
  Filled 2019-02-21: qty 1

## 2019-02-21 NOTE — ED Notes (Signed)
[facility]

## 2019-02-21 NOTE — Telephone Encounter (Signed)
Call was received through triage line- patient states that she had a cardioversion last week, and since then has felt worse- she has had increased blood pressure, palpitations that are worse, new onset swelling, SOB, and a heavy discomfort pressure feeling in her chest. Patient states her BP has been 171/101 and just recently when checked it was 198/101 HR 93, her HR before was in the 50's and 60's and now is high in the 90's and patient just does not feel well. She has taken her medications this morning and she is still having issues, she did take a nitro last night to get some relief but it did not completely take it away.  I advised patient with her symptoms to go to the ED.  Patient verbalized understanding.  Will route to MD to make aware. Thanks!

## 2019-02-21 NOTE — ED Provider Notes (Signed)
[facility] EMERGENCY DEPARTMENT Provider Note   CSN: [id number] Arrival date & time: 02/21/19  1418     History Chief Complaint  Patient presents with  . Irregular Heart Beat    [name] is a 86 y.o. female.  HPI 86 year old female presents with recurrent atrial fibrillation.  She states she was cardioverted on 1/28.  On 2/2 she felt her A. fib come back.  Seems to be worse over the last 2 days.  More palpitations.  Denies chest pressure or pain or shortness of breath.  Felt lightheaded today and her blood pressure was elevated and heart rate was elevated.  She states the blood pressure was up to about 150/90 and heart rate in the 90s.  Has some chronic leg swelling that is pretty similar to baseline.  She reports compliance with her meds except for not taking her evening meds because of her being in the waiting room.   Past Medical History:  Diagnosis Date  . AICD (automatic cardioverter/defibrillator) present   . Allergy    SESONAL  . Anxiety   . ARTHRITIS   . Arthritis   . Atrial fibrillation ([place])   . CAD (coronary artery disease)    a. history of cardiac arrest 1993;  b. s/p LAD/LCX stenting in 2003;  c. 07/2011 Cath: LM nl, LAD patent stent, D1 80ost, LCX patent stent, RCA min irregs;  d. 04/2012 MV: EF 66%, no ishcemia;  e. 12/2013 Echo: Ef 55%, no rwma, Gr 1 DD, triv AI/MR, mildly dil LA;  f. 12/2013 Lexi MV: intermediate risk - apical ischemia and inf/infsept fixed defect, ? artifactual.  . CAD in native artery 12/31/2013   Previous stents to LAD and Ramus patent on cath today 12/31/13 also There is severe disease in the ostial first diagonal which is unchanged from most recent cardiac catheterization. The right coronary artery could not be engaged selectively but nonselective angiography showed no significant disease in the proximal and midsegment.     . Cataract    DENIES  . Chronic back pain   . Chronic sinus bradycardia   . CKD (chronic  kidney disease), stage III   . Clotting disorder (HCC)    DVT  . COLITIS 12/02/2007   Qualifier: Diagnosis of  By: Nils Pyle CMA ([place]), [name]    . Diabetes mellitus without complication ([place])    DENIES  . Diverticulosis of colon (without mention of hemorrhage) 2007   Colonoscopy   . Esophageal stricture    a. 2012 s/p dil.  . Esophagitis, unspecified    a. 2012 EGD  . EXTERNAL HEMORRHOIDS   . GERD (gastroesophageal reflux disease)    omeprazole  . H/O hiatal hernia   . Hiatal hernia    a. 2012 EGD.  Marland Kitchen History of DVT in the past, not on Coumadin now    left  leg  . HYPERCHOLESTEROLEMIA   . Hypertension   . ICD (implantable cardiac defibrillator) in place    a. s/p initial ICD in 1993 in setting of cardiac arrest;  b. 01/2007 gen change: Guidant T135 Vitality DS VR single lead ICD.  . Macular degeneration    gets injecton in eye every 5 weeks- last injection - 05/03/2013   . Myocardial infarction ([place]) 1993  . Near syncope 05/22/2015  . Nephrolithiasis, just saw Dr [name]   . Orthostatic hypotension   . Peripheral vascular disease ([place])    ???  . RECTAL BLEEDING 12/03/2007   Qualifier: Diagnosis of  By: Sharlett Iles MD Byrd Hesselbach Unspecified gastritis and gastroduodenitis without mention of hemorrhage    a. 2003 EGD->not noted on 2012 EGD.  Marland Kitchen Unstable angina, neg MI, cath stable.maybe GI 12/30/2013    Patient Active Problem List   Diagnosis Date Noted  . Persistent atrial fibrillation ([place])   . Proctitis 01/09/2019  . Atrial fibrillation, chronic ([place]) 01/09/2019  . Hyperlipidemia 01/09/2019  . Nausea and vomiting 01/09/2019  . Hyperglycemia 01/09/2019  . COVID-19 virus infection 12/28/2018  . Chronic diarrhea 09/18/2018  . Abdominal pain 09/18/2018  . Osteoarthritis of right knee 03/29/2018  . GERD (gastroesophageal reflux disease)   . Diarrhea of presumed infectious origin 02/06/2018  . Essential hypertension 12/21/2017  . Degenerative scoliosis 02/19/2017    . Burst fracture of lumbar vertebra ([place]) 02/19/2017  . History of drug-induced prolonged QT interval with torsade de pointes 11/01/2016  . Iron deficiency anemia 10/30/2016  . Long term current use of anticoagulant 02/17/2016  . Near syncope 05/22/2015  . Diabetes mellitus type 2, diet-controlled ([place]) 09/14/2014  . ICD (implantable cardioverter-defibrillator) battery depletion 01/20/2014  . Abnormal nuclear stress test, 12/30/13 12/31/2013  . Coronary artery disease involving native coronary artery of native heart without angina pectoris 12/31/2013  . Paroxysmal atrial fibrillation, chads2 Vasc2 score of 5, on eliquis 10/16/2013  . Catecholaminergic polymorphic ventricular tachycardia ([place]) 10/16/2013  . Ureteral stone with hydronephrosis 05/20/2013  . Metabolic syndrome [id number]  . Orthostatic hypotension 07/27/2011  . Chest pain, r/o cardiac, negative MI 07/25/2011  . Weakness 07/25/2011    Class: Acute  . Chronic sinus bradycardia 07/25/2011  . ICD (implantable cardioverter-defibrillator) in place 07/25/2011  . CKD (chronic kidney disease) stage 3, GFR 30-59 ml/min (HCC) 07/25/2011    Class: Chronic  . History of DVT in the past, on eliquis now 07/25/2011    Class: History of  . Nephrolithiasis, just saw Dr [name] 07/25/2011    Class: History of  . DYSPHAGIA 03/24/2010  . Chronic ischemic heart disease 12/03/2007  . Irritable bowel syndrome with diarrhea 12/03/2007  . EPIGASTRIC PAIN 12/03/2007  . Hyperlipidemia associated with type 2 diabetes mellitus ([place]) 12/02/2007    Class: History of  . EXTERNAL HEMORRHOIDS 12/02/2007    Class: History of  . ESOPHAGITIS 12/02/2007  . GASTRITIS 12/02/2007  . DIVERTICULOSIS, COLON 12/02/2007  . ARTHRITIS 12/02/2007    Class: Chronic    Past Surgical History:  Procedure Laterality Date  . BREAST BIOPSY Left 11/2013  . CARDIAC CATHETERIZATION    . CARDIAC CATHETERIZATION  01/01/15   patent stents -there is severe disease  in ostial 1st diag but without change.   Marland Kitchen CARDIAC DEFIBRILLATOR PLACEMENT  02/05/2007   Guidant  . CARDIOVASCULAR STRESS TEST  12/30/2013   abnormal  . CARDIOVERSION N/A 02/13/2019   Procedure: CARDIOVERSION;  Surgeon: Sanda Klein, MD;  Location: [facility];  Service: Cardiovascular;  Laterality: N/A;  . COLONOSCOPY    . coronary stents     . CYSTOSCOPY WITH URETEROSCOPY Right 05/20/2013   Procedure: CYSTOSCOPY, RIGHT URETEROSCOPY STONE EXTRACTION, Insertion of right DOUBLE J STENT ;  Surgeon: Irine Seal, MD;  Location: [facility];  Service: Urology;  Laterality: Right;  . CYSTOSCOPY WITH URETEROSCOPY AND STENT PLACEMENT N/A 06/03/2013   Procedure: SECOND LOOK CYSTOSCOPY WITH URETEROSCOPY  HOLMIUM LASER LITHO AND STONE EXTRACTION Sammie Bench ;  Surgeon: Malka So, MD;  Location: [facility];  Service: Urology;  Laterality: N/A;  . GIVENS CAPSULE STUDY N/A 10/30/2016  Procedure: GIVENS CAPSULE STUDY;  Surgeon: Irene Shipper, MD;  Location: [facility]OS[facility];  Service: Endoscopy;  Laterality: N/A;  NEEDS TO BE ADMITTED FOR OBSERVATION PT. HAS DEFIB  . HEMORROIDECTOMY  80's  . HOLMIUM LASER APPLICATION Right [id number]   Procedure: HOLMIUM LASER APPLICATION;  Surgeon: Irine Seal, MD;  Location: [facility];  Service: Urology;  Laterality: Right;  . HYSTEROSCOPY WITH D & C  09/02/2010   Procedure: DILATATION AND CURETTAGE (D&C) /HYSTEROSCOPY;  Surgeon: Margarette Asal;  Location: [facility];  Service: Gynecology;  Laterality: N/A;  Dilation and Curettage with Hysteroscopy and Polypectomy  . IMPLANTABLE CARDIOVERTER DEFIBRILLATOR (ICD) GENERATOR CHANGE N/A 02/10/2014   Procedure: ICD GENERATOR CHANGE;  Surgeon: Sanda Klein, MD;  Location: [facility];  Service: Cardiovascular;  Laterality: N/A;  . LEFT HEART CATHETERIZATION WITH CORONARY ANGIOGRAM N/A 07/28/2011   Procedure: LEFT HEART CATHETERIZATION WITH CORONARY ANGIOGRAM;  Surgeon: Lorretta Harp, MD;  Location: [facility] LAB;  Service: Cardiovascular;   Laterality: N/A;  . LEFT HEART CATHETERIZATION WITH CORONARY ANGIOGRAM N/A 12/31/2013   Procedure: LEFT HEART CATHETERIZATION WITH CORONARY ANGIOGRAM;  Surgeon: Wellington Hampshire, MD;  Location: [facility] CATH LAB;  Service: Cardiovascular;  Laterality: N/A;     OB History   No obstetric history on file.     Family History  Problem Relation Age of Onset  . Stroke Mother   . Other Mother        brain tumor  . Other Father        MI  . Heart attack Father   . Stroke Sister   . Macular degeneration Sister   . Diabetes Daughter   . Cancer Daughter        ovarian  . Colon polyps Daughter   . Atrial fibrillation Sister   . Hyperlipidemia Sister   . Osteoporosis Sister   . Stroke Sister   . Uterine cancer Sister   . Heart attack Brother   . Heart disease Brother   . Asthma Brother   . Breast cancer Neg Hx     Social History   Tobacco Use  . Smoking status: Never Smoker  . Smokeless tobacco: Never Used  Substance Use Topics  . Alcohol use: No  . Drug use: No    Home Medications Prior to Admission medications   Medication Sig Start Date End Date Taking? Authorizing Provider  amLODipine (NORVASC) 2.5 MG tablet Take 1 tablet (2.5 mg total) by mouth daily. 01/24/19 04/24/19  Almyra Deforest, PA  Calcium Citrate-Vitamin D (CITRACAL + D PO) Take 1 tablet by mouth every evening.     [provider]  carboxymethylcellulose (REFRESH PLUS) 0.5 % SOLN Place 1 drop into both eyes 3 (three) times daily as needed (dry/irritated eyes.).    [provider]  diphenoxylate-atropine (LOMOTIL) 2.5-0.025 MG tablet Take 1 tablet by mouth every 8 (eight) hours as needed for diarrhea or loose stools. 05/16/18 05/16/19  Chipper Herb, MD  ELIQUIS 2.5 MG TABS tablet TAKE  (1)  TABLET TWICE A DAY. Patient taking differently: Take 2.5 mg by mouth 2 (two) times daily.  10/30/18   Croitoru, Mihai, MD  loratadine (CLARITIN) 10 MG tablet Take 1 tablet (10 mg total) by mouth daily. (for  itching) Patient taking differently: Take 10 mg by mouth daily as needed ((for itching)).  01/20/19   [name], DO  metoprolol tartrate (LOPRESSOR) 25 MG tablet TAKE  (1)  TABLET TWICE A DAY. Patient taking differently: Take 25  mg by mouth 2 (two) times daily.  11/12/18   Croitoru, Mihai, MD  mirtazapine (REMERON) 7.5 MG tablet TAKE ONE TABLET AT BEDTIME, TAPER OFF PAXIL 02/18/19   Ronnie Doss M, DO  Multiple Vitamins-Minerals (CENTRUM SILVER ULTRA WOMENS PO) Take 1 tablet by mouth daily.    [provider]  nitroGLYCERIN (NITROSTAT) 0.4 MG SL tablet Place 1 tablet (0.4 mg total) under the tongue every 5 (five) minutes as needed for chest pain. 12/20/18   Claretta Fraise, MD  pantoprazole (PROTONIX) 40 MG tablet Take 1 tablet (40 mg total) by mouth every evening. 02/21/19   Ronnie Doss M, DO  PARoxetine (PAXIL) 20 MG tablet TAKE (1) TABLET DAILY IN THE MORNING. 02/21/19   Ronnie Doss M, DO  rosuvastatin (CRESTOR) 40 MG tablet TAKE (1/2) TABLET DAILY. Patient taking differently: Take 20 mg by mouth at bedtime.  01/21/19   [name], DO  torsemide (DEMADEX) 20 MG tablet Take 20 mg by mouth daily as needed (edema).    [provider]  Vitamin D, Ergocalciferol, (DRISDOL) 1.25 MG (50000 UNIT) CAPS capsule Take 1 capsule (50,000 Units total) by mouth every 7 (seven) days. 02/18/19   [name], DO    Allergies    Sotalol, Bactrim [sulfamethoxazole-trimethoprim], Ciprofloxacin, Actos [pioglitazone], Adhesive [tape], Contrast media [iodinated diagnostic agents], Latex, and Lipitor [atorvastatin]  Review of Systems   Review of Systems  Respiratory: Negative for shortness of breath.   Cardiovascular: Positive for palpitations. Negative for chest pain.  Neurological: Positive for light-headedness. Negative for weakness.  All other systems reviewed and are negative.   Physical Exam Updated Vital Signs BP (!) 144/94   Pulse 91   Temp 97.7 F (36.5  C) (Oral)   Resp 15   Ht 5\' 3"  (1.6 m)   Wt 63.5 kg   SpO2 95%   BMI 24.80 kg/m   Physical Exam Vitals and nursing note reviewed.  Constitutional:      Appearance: She is well-developed.  HENT:     Head: Normocephalic and atraumatic.     Right Ear: External ear normal.     Left Ear: External ear normal.     Nose: Nose normal.  Eyes:     General:        Right eye: No discharge.        Left eye: No discharge.  Cardiovascular:     Rate and Rhythm: Normal rate. Rhythm irregular.     Heart sounds: Normal heart sounds.  Pulmonary:     Effort: Pulmonary effort is normal.     Breath sounds: Normal breath sounds.  Abdominal:     Palpations: Abdomen is soft.     Tenderness: There is no abdominal tenderness.  Musculoskeletal:     Right lower leg: Edema present.     Left lower leg: Edema present.     Comments: Mild pitting edema to bilateral lower legs  Skin:    General: Skin is warm and dry.  Neurological:     Mental Status: She is alert.  Psychiatric:        Mood and Affect: Mood is not anxious.     ED Results / Procedures / Treatments   Labs (all labs ordered are listed, but only abnormal results are displayed) Labs Reviewed  BASIC METABOLIC PANEL - Abnormal; Notable for the following components:      Result Value   Glucose, Bld 157 (*)    Creatinine, Ser 1.24 (*)    GFR calc  non Af Amer 40 (*)    GFR calc Af Amer 46 (*)    All other components within normal limits  CBC - Abnormal; Notable for the following components:   Hemoglobin 11.9 (*)    All other components within normal limits    EKG EKG Interpretation  Date/Time:  Friday February 21 2019 22:33:26 EST Ventricular Rate:  96 PR Interval:    QRS Duration: 123 QT Interval:  377 QTC Calculation: 477 R Axis:   -57 Text Interpretation: Atrial fibrillation Left bundle branch block no significant change since earlier in the day Confirmed by Sherwood Gambler [phone]) on 02/21/2019 10:47:10 PM   Radiology DG  Chest 2 View  Result Date: 02/21/2019 CLINICAL DATA:  Recurrent atrial fibrillation. Cardioversion 1 week ago. EXAM: CHEST - 2 VIEW COMPARISON:  Radiographs 01/08/2019 and CT 02/26/2012. FINDINGS: The left subclavian AICD leads appear unchanged, extending into the right ventricle. Stable mild cardiomegaly and aortic atherosclerosis. Interval resolution of the previously demonstrated hazy pulmonary opacities. No edema, confluent airspace opacity, pleural effusion or pneumothorax. Stable mild degenerative changes in the spine. IMPRESSION: No acute cardiopulmonary process. Resolved hazy pulmonary opacities. Stable cardiomegaly and AICD position. Electronically Signed   By: Richardean Sale M.D.   On: 02/21/2019 15:05    Procedures Procedures (including critical care time)  Medications Ordered in ED Medications  sodium chloride flush (NS) 0.9 % injection 3 mL (has no administration in time range)  apixaban (ELIQUIS) tablet 2.5 mg (has no administration in time range)  metoprolol tartrate (LOPRESSOR) injection 2.5 mg (2.5 mg Intravenous Given 02/21/19 2313)  metoprolol tartrate (LOPRESSOR) tablet 25 mg (25 mg Oral Given 02/21/19 2313)    ED Course  I have reviewed the triage vital signs and the nursing notes.  Pertinent labs & imaging results that were available during my care of the patient were reviewed by me and considered in my medical decision making (see chart for details).    MDM Rules/Calculators/A&P     CHA2DS2-VASc Score: 6                 Currently patient feels much better at rest.  Heart rate is in the 90s.  She was given IV and oral metoprolol and her oral Eliquis that she missed tonight.  I discussed options with patient but she does not want to go through cardioversion again at this time.  It does not seem emergent at this time.  She is anticoagulated.  She has no anginal type symptoms.  We will discharge her to follow-up with her cardiologist next week as scheduled.  We discussed  return precautions. Final Clinical Impression(s) / ED Diagnoses Final diagnoses:  Paroxysmal atrial fibrillation [facility])    Rx / DC Orders ED Discharge Orders    None       Sherwood Gambler, MD 02/21/19 2348

## 2019-02-21 NOTE — ED Triage Notes (Signed)
States she was cardioverted last week , states her heart got out of rhythm  On Tues and got worse yest states it feels worse today

## 2019-02-21 NOTE — Telephone Encounter (Signed)
New Message   Patient c/o Palpitations:  High priority if patient c/o lightheadedness, shortness of breath, or chest pain  1) How long have you had palpitations/irregular HR/ Afib? Are you having the symptoms now? Since Tuesday   2) Are you currently experiencing lightheadedness, SOB or CP? Some sob  3) Do you have a history of afib (atrial fibrillation) or irregular heart rhythm? yes  4) Have you checked your BP or HR? (document readings if available): BP 171/101 HR 93, BP 152/98 HR 81  5) Are you experiencing any other symptoms? Patient states that she only get relief when she is laying down. She said last night she got some relief when she took some nitroglycerin.             She mentioned that she just had a cardioversion

## 2019-02-22 NOTE — ED Notes (Signed)
Pt family contacted and said it will be one hour until they arrive at the ED.

## 2019-02-24 NOTE — Telephone Encounter (Signed)
Please increase metoprolol to 50 mg twice daily. This should help with both heart rate and BP.

## 2019-02-24 NOTE — Telephone Encounter (Signed)
Attempted to call the patient. The line was busy.

## 2019-02-24 NOTE — Telephone Encounter (Signed)
Follow Up  Patient is calling back in now that she is out of the hospital and was told to call and speak with Dr. Loletha Grayer. Patient is aware of appointment that she has this week, but is still requesting to speak with Dr. Loletha Grayer. Please give patient a call back to discuss.

## 2019-02-25 ENCOUNTER — Other Ambulatory Visit: Payer: Self-pay

## 2019-02-25 ENCOUNTER — Ambulatory Visit: Payer: Medicare Other | Admitting: Physician Assistant

## 2019-02-26 ENCOUNTER — Ambulatory Visit (INDEPENDENT_AMBULATORY_CARE_PROVIDER_SITE_OTHER): Payer: Medicare Other | Admitting: Family Medicine

## 2019-02-26 VITALS — BP 130/78 | HR 66 | Temp 96.9°F | Ht 63.0 in | Wt 144.0 lb

## 2019-02-26 DIAGNOSIS — M1711 Unilateral primary osteoarthritis, right knee: Secondary | ICD-10-CM

## 2019-02-26 DIAGNOSIS — I4819 Other persistent atrial fibrillation: Secondary | ICD-10-CM

## 2019-02-26 DIAGNOSIS — Z09 Encounter for follow-up examination after completed treatment for conditions other than malignant neoplasm: Secondary | ICD-10-CM

## 2019-02-26 MED ORDER — METOPROLOL TARTRATE 50 MG PO TABS: 50.0000 mg | ORAL_TABLET | Freq: Two times a day (BID) | ORAL | 3 refills | Status: DC

## 2019-02-26 NOTE — Telephone Encounter (Signed)
The patient has been made aware to increase the Metoprolol Tartrate to 50 mg twice daily. She will keep a log of her blood pressures.   She has an appointment tomorrow with APP.

## 2019-02-26 NOTE — Addendum Note (Signed)
Addended by: Ricci Barker on: 02/26/2019 10:59 AM   Modules accepted: Orders

## 2019-02-26 NOTE — Progress Notes (Signed)
Subjective: CC: Hospital follow up, chronic right knee pain PCP: [name], DO [id number] [name] is a 86 y.o. female presenting to clinic today for:  1. Hospital discharge follow up Patient was cardioverted 02/13/2019.  She was seen in ED 02/21/19 after she developed sudden onset chest tightness, increased heart rate and elevated blood pressure.  She was treated with IV beta-blocker at there and heart rate improved.  She was offered repeat cardioversion but she declined this.  She notes that she has had intermittent heart palpitations and tightness since but her heart rate has not gone as high as it was previously.  She has used as needed nitro for this and this has relieved her symptoms.  She has a follow-up with cardiology tomorrow.  She plans on discussing as needed medication for elevated heart rates.  2.  Chronic knee pain Patient reports chronic right-sided knee pain.  She has slight swelling today.  She had her last corticosteroid injection in June 2020 which did last her quite a while.  Symptoms are starting to return and she would like to get a corticosteroid injection today.  She had no complications on the last corticosteroid injection, including bleeding or infection.  ROS: Per HPI  Allergies  Allergen Reactions  . Sotalol Other (See Comments)    Torsades   . Bactrim [Sulfamethoxazole-Trimethoprim]     Itchy rash  . Ciprofloxacin Nausea Only  . Actos [Pioglitazone] Swelling  . Adhesive [Tape] Other (See Comments)    Causes sores  . Contrast Media [Iodinated Diagnostic Agents] Other (See Comments)    Headache (no action/pre-med required)  . Latex Rash  . Lipitor [Atorvastatin] Other (See Comments)    myalgia   Past Medical History:  Diagnosis Date  . AICD (automatic cardioverter/defibrillator) present   . Allergy    SESONAL  . Anxiety   . ARTHRITIS   . Arthritis   . Atrial fibrillation ([place])   . CAD (coronary artery disease)    a. history of cardiac  arrest 1993;  b. s/p LAD/LCX stenting in 2003;  c. 07/2011 Cath: LM nl, LAD patent stent, D1 80ost, LCX patent stent, RCA min irregs;  d. 04/2012 MV: EF 66%, no ishcemia;  e. 12/2013 Echo: Ef 55%, no rwma, Gr 1 DD, triv AI/MR, mildly dil LA;  f. 12/2013 Lexi MV: intermediate risk - apical ischemia and inf/infsept fixed defect, ? artifactual.  . CAD in native artery 12/31/2013   Previous stents to LAD and Ramus patent on cath today 12/31/13 also There is severe disease in the ostial first diagonal which is unchanged from most recent cardiac catheterization. The right coronary artery could not be engaged selectively but nonselective angiography showed no significant disease in the proximal and midsegment.     . Cataract    DENIES  . Chronic back pain   . Chronic sinus bradycardia   . CKD (chronic kidney disease), stage III   . Clotting disorder (HCC)    DVT  . COLITIS 12/02/2007   Qualifier: Diagnosis of  By: Nils Pyle CMA ([place]), [name]    . Diabetes mellitus without complication ([place])    DENIES  . Diverticulosis of colon (without mention of hemorrhage) 2007   Colonoscopy   . Esophageal stricture    a. 2012 s/p dil.  . Esophagitis, unspecified    a. 2012 EGD  . EXTERNAL HEMORRHOIDS   . GERD (gastroesophageal reflux disease)    omeprazole  . H/O hiatal hernia   . Hiatal hernia  a. 2012 EGD.  Marland Kitchen History of DVT in the past, not on Coumadin now    left  leg  . HYPERCHOLESTEROLEMIA   . Hypertension   . ICD (implantable cardiac defibrillator) in place    a. s/p initial ICD in 1993 in setting of cardiac arrest;  b. 01/2007 gen change: Guidant T135 Vitality DS VR single lead ICD.  . Macular degeneration    gets injecton in eye every 5 weeks- last injection - 05/03/2013   . Myocardial infarction ([place]) 1993  . Near syncope 05/22/2015  . Nephrolithiasis, just saw Dr [name]   . Orthostatic hypotension   . Peripheral vascular disease ([place])    ???  . RECTAL BLEEDING 12/03/2007   Qualifier:  Diagnosis of  By: Sharlett Iles MD Byrd Hesselbach   . Unspecified gastritis and gastroduodenitis without mention of hemorrhage    a. 2003 EGD->not noted on 2012 EGD.  Marland Kitchen Unstable angina, neg MI, cath stable.maybe GI 12/30/2013    Current Outpatient Medications:  .  amLODipine (NORVASC) 2.5 MG tablet, Take 1 tablet (2.5 mg total) by mouth daily., Disp: 30 tablet, Rfl: 3 .  Calcium Citrate-Vitamin D (CITRACAL + D PO), Take 1 tablet by mouth every evening. , Disp: , Rfl:  .  carboxymethylcellulose (REFRESH PLUS) 0.5 % SOLN, Place 1 drop into both eyes 3 (three) times daily as needed (dry/irritated eyes.)., Disp: , Rfl:  .  diphenoxylate-atropine (LOMOTIL) 2.5-0.025 MG tablet, Take 1 tablet by mouth every 8 (eight) hours as needed for diarrhea or loose stools., Disp: 30 tablet, Rfl: 1 .  ELIQUIS 2.5 MG TABS tablet, TAKE  (1)  TABLET TWICE A DAY. (Patient taking differently: Take 2.5 mg by mouth 2 (two) times daily. ), Disp: 180 tablet, Rfl: 1 .  loratadine (CLARITIN) 10 MG tablet, Take 1 tablet (10 mg total) by mouth daily. (for itching) (Patient taking differently: Take 10 mg by mouth daily as needed ((for itching)). ), Disp: 30 tablet, Rfl: 11 .  metoprolol tartrate (LOPRESSOR) 25 MG tablet, TAKE  (1)  TABLET TWICE A DAY. (Patient taking differently: Take 25 mg by mouth 2 (two) times daily. ), Disp: 180 tablet, Rfl: 0 .  mirtazapine (REMERON) 7.5 MG tablet, TAKE ONE TABLET AT BEDTIME, TAPER OFF PAXIL, Disp: 30 tablet, Rfl: 0 .  Multiple Vitamins-Minerals (CENTRUM SILVER ULTRA WOMENS PO), Take 1 tablet by mouth daily., Disp: , Rfl:  .  nitroGLYCERIN (NITROSTAT) 0.4 MG SL tablet, Place 1 tablet (0.4 mg total) under the tongue every 5 (five) minutes as needed for chest pain., Disp: 25 tablet, Rfl: 2 .  pantoprazole (PROTONIX) 40 MG tablet, Take 1 tablet (40 mg total) by mouth every evening., Disp: 90 tablet, Rfl: 3 .  PARoxetine (PAXIL) 20 MG tablet, TAKE (1) TABLET DAILY IN THE MORNING., Disp: 90 tablet,  Rfl: 0 .  rosuvastatin (CRESTOR) 40 MG tablet, TAKE (1/2) TABLET DAILY. (Patient taking differently: Take 20 mg by mouth at bedtime. ), Disp: 45 tablet, Rfl: 0 .  torsemide (DEMADEX) 20 MG tablet, Take 20 mg by mouth daily as needed (edema)., Disp: , Rfl:  .  Vitamin D, Ergocalciferol, (DRISDOL) 1.25 MG (50000 UNIT) CAPS capsule, Take 1 capsule (50,000 Units total) by mouth every 7 (seven) days., Disp: 8 capsule, Rfl: 0 Social History   Socioeconomic History  . Marital status: Widowed    Spouse name: Not on file  . Number of children: Not on file  . Years of education: Not on file  . Highest  education level: Not on file  Occupational History  . Not on file  Tobacco Use  . Smoking status: Never Smoker  . Smokeless tobacco: Never Used  Substance and Sexual Activity  . Alcohol use: No  . Drug use: No  . Sexual activity: Never    Birth control/protection: Post-menopausal  Other Topics Concern  . Not on file  Social History Narrative  . Not on file   Social Determinants of Health   Financial Resource Strain:   . Difficulty of Paying Living Expenses: Not on file  Food Insecurity:   . Worried About Charity fundraiser in the Last Year: Not on file  . Ran Out of Food in the Last Year: Not on file  Transportation Needs:   . Lack of Transportation (Medical): Not on file  . Lack of Transportation (Non-Medical): Not on file  Physical Activity:   . Days of Exercise per Week: Not on file  . Minutes of Exercise per Session: Not on file  Stress:   . Feeling of Stress : Not on file  Social Connections:   . Frequency of Communication with Friends and Family: Not on file  . Frequency of Social Gatherings with Friends and Family: Not on file  . Attends Religious Services: Not on file  . Active Member of Clubs or Organizations: Not on file  . Attends Archivist Meetings: Not on file  . Marital Status: Not on file  Intimate Partner Violence:   . Fear of Current or Ex-Partner:  Not on file  . Emotionally Abused: Not on file  . Physically Abused: Not on file  . Sexually Abused: Not on file   Family History  Problem Relation Age of Onset  . Stroke Mother   . Other Mother        brain tumor  . Other Father        MI  . Heart attack Father   . Stroke Sister   . Macular degeneration Sister   . Diabetes Daughter   . Cancer Daughter        ovarian  . Colon polyps Daughter   . Atrial fibrillation Sister   . Hyperlipidemia Sister   . Osteoporosis Sister   . Stroke Sister   . Uterine cancer Sister   . Heart attack Brother   . Heart disease Brother   . Asthma Brother   . Breast cancer Neg Hx     Objective: Office vital signs reviewed. BP 130/78   Pulse 66   Temp (!) 96.9 F (36.1 C) (Temporal)   Ht 5\' 3"  (1.6 m)   Wt 144 lb (65.3 kg)   BMI 25.51 kg/m   Physical Examination:  General: Awake, alert, No acute distress HEENT: Normal, sclera white Cardio: irregularly irregular with rate control, S1S2 heard, no murmurs appreciated Pulm: clear to auscultation bilaterally, no wheezes, rhonchi or rales; normal work of breathing on room air Extremities: warm, well perfused, No edema, cyanosis or clubbing; +2 pulses bilaterally MSK: antlagic gait and hunched station; uses cane for ambulation  Right knee: Patient with limited extension secondary to pain and swelling.  She has a mild joint effusion noted.  No increased warmth, redness.  No tenderness to palpation to the patella, patellar tendon, quad tendon, medial or lateral joint line.  No ligamentous laxity   JOINT INJECTION:  Patient denies allergy to antiseptics (including iodine) and anesthetics.  Patient with no h/o diabetes, frequent steroid use.  She does use of blood thinners/  antiplatelets.  Patient was given informed consent and a signed copy has been placed in the chart. Appropriate time out was taken. Area prepped and draped in usual sterile fashion. Anatomic landmarks were identified and  injection site was marked.  Ethyl chloride spray was used to numb the area and 1 cc of methylprednisolone 40 mg/ml plus  3 cc of 1% lidocaine without epinephrine was injected into the right knee using a(n) anteriolatera approach. The patient tolerated the procedure well and there were no immediate complications. Estimated blood loss is less than 1 cc.  Post procedure instructions were reviewed and handout outlining these instructions were provided to patient.   Assessment/ Plan: 86 y.o. female   1. Hospital discharge follow-up Hospital discharge summary from December reviewed.  I reviewed her most recent emergency department note and saw that she had cardioversion in January.  Labs were obtained in the emergency department to these were not repeated today.  2. Persistent atrial fibrillation ([place]) She has ongoing A. fib with good rate control.  I do wonder if she would benefit from an as needed medication for elevated heart rates.  Additionally, I wonder if she would benefit from establishing with atrial fibrillation clinic to avoid emergency department visits.  I will reach out to her cardiologist to get their input with regards to this.  3. Primary osteoarthritis of right knee She was treated with a corticosteroid injection today.  She tolerated procedure without difficulty.  Home care instructions reviewed and reasons return discussed.  Follow-up as needed   No orders of the defined types were placed in this encounter.  No orders of the defined types were placed in this encounter.    [name], DO [place] [phone]

## 2019-02-26 NOTE — Patient Instructions (Signed)
Knee Injection A knee injection is a procedure to get medicine into your knee joint to relieve the pain, swelling, and stiffness of arthritis. Your health care provider uses a needle to inject medicine, which may also help to lubricate and cushion your knee joint. You may need more than one injection. Tell a health care provider about:  Any allergies you have.  All medicines you are taking, including vitamins, herbs, eye drops, creams, and over-the-counter medicines.  Any problems you or family members have had with anesthetic medicines.  Any blood disorders you have.  Any surgeries you have had.  Any medical conditions you have.  Whether you are pregnant or may be pregnant. What are the risks? Generally, this is a safe procedure. However, problems may occur, including:  Infection.  Bleeding.  Symptoms that get worse.  Damage to the area around your knee.  Allergic reaction to any of the medicines.  Skin reactions from repeated injections. What happens before the procedure?  Ask your health care provider about changing or stopping your regular medicines. This is especially important if you are taking diabetes medicines or blood thinners.  Plan to have someone take you home from the hospital or clinic. What happens during the procedure?   You will sit or lie down in a position for your knee to be treated.  The skin over your kneecap will be cleaned with a germ-killing soap.  You will be given a medicine that numbs the area (local anesthetic). You may feel some stinging.  The medicine will be injected into your knee. The needle is carefully placed between your kneecap and your knee. The medicine is injected into the joint space.  The needle will be removed at the end of the procedure.  A bandage (dressing) may be placed over the injection site. The procedure may vary among health care providers and hospitals. What can I expect after the procedure?  Your blood  pressure, heart rate, breathing rate, and blood oxygen level will be monitored until you leave the hospital or clinic.  You may have to move your knee through its full range of motion. This helps to get all the medicine into your joint space.  You will be watched to make sure that you do not have a reaction to the injected medicine.  You may feel more pain, swelling, and warmth than you did before the injection. This reaction may last about 1-2 days. Follow these instructions at home: Medicines  Take over-the-counter and prescription medicines only as told by your doctor.  Do not drive or use heavy machinery while taking prescription pain medicine.  Do not take medicines such as aspirin and ibuprofen unless your health care provider tells you to take them. Injection site care  Follow instructions from your health care provider about: ? How to take care of your puncture site. ? When and how you should change your dressing. ? When you should remove your dressing.  Check your injection area every day for signs of infection. Check for: ? More redness, swelling, or pain after 2 days. ? Fluid or blood. ? Pus or a bad smell. ? Warmth. Managing pain, stiffness, and swelling   If directed, put ice on the injection area: ? Put ice in a plastic bag. ? Place a towel between your skin and the bag. ? Leave the ice on for 20 minutes, 2-3 times per day.  Do not apply heat to your knee.  Raise (elevate) the injection area above the level   of your heart while you are sitting or lying down. General instructions  If you were given a dressing, keep it dry until your health care provider says it can be removed. Ask your health care provider when you can start showering or taking a bath.  Avoid strenuous activities for as long as directed by your health care provider. Ask your health care provider when you can return to your normal activities.  Keep all follow-up visits as told by your health  care provider. This is important. You may need more injections. Contact a health care provider if you have:  A fever.  Warmth in your injection area.  Fluid, blood, or pus coming from your injection site.  Symptoms at your injection site that last longer than 2 days after your procedure. Get help right away if:  Your knee: ? Turns very red. ? Becomes very swollen. ? Is in severe pain. Summary  A knee injection is a procedure to get medicine into your knee joint to relieve the pain, swelling, and stiffness of arthritis.  A needle is carefully placed between your kneecap and your knee to inject medicine into the joint space.  Before the procedure, ask your health care provider about changing or stopping your regular medicines, especially if you are taking diabetes medicines or blood thinners.  Contact your health care provider if you have any problems or questions after your procedure. This information is not intended to replace advice given to you by your health care provider. Make sure you discuss any questions you have with your health care provider. Document Revised: 01/22/2017 Document Reviewed: 01/22/2017 Elsevier Patient Education  2020 [employer]  

## 2019-02-27 ENCOUNTER — Ambulatory Visit: Payer: Medicare Other

## 2019-02-27 ENCOUNTER — Ambulatory Visit (INDEPENDENT_AMBULATORY_CARE_PROVIDER_SITE_OTHER): Payer: Medicare Other | Admitting: Medical

## 2019-02-27 ENCOUNTER — Encounter: Payer: Self-pay | Admitting: Medical

## 2019-02-27 ENCOUNTER — Other Ambulatory Visit: Payer: Self-pay

## 2019-02-27 VITALS — BP 142/80 | HR 82 | Temp 97.1°F | Ht 63.0 in | Wt 145.0 lb

## 2019-02-27 DIAGNOSIS — I4729 Other ventricular tachycardia: Secondary | ICD-10-CM

## 2019-02-27 DIAGNOSIS — I10 Essential (primary) hypertension: Secondary | ICD-10-CM

## 2019-02-27 DIAGNOSIS — I25119 Atherosclerotic heart disease of native coronary artery with unspecified angina pectoris: Secondary | ICD-10-CM

## 2019-02-27 DIAGNOSIS — E785 Hyperlipidemia, unspecified: Secondary | ICD-10-CM

## 2019-02-27 DIAGNOSIS — I472 Ventricular tachycardia: Secondary | ICD-10-CM

## 2019-02-27 DIAGNOSIS — I4819 Other persistent atrial fibrillation: Secondary | ICD-10-CM

## 2019-02-27 DIAGNOSIS — Z95810 Presence of automatic (implantable) cardiac defibrillator: Secondary | ICD-10-CM

## 2019-02-27 MED ORDER — ISOSORBIDE MONONITRATE ER 30 MG PO TB24: 15.0000 mg | ORAL_TABLET | Freq: Every day | ORAL | 6 refills | Status: DC

## 2019-02-27 MED ORDER — METOPROLOL TARTRATE 25 MG PO TABS: ORAL_TABLET | ORAL | 3 refills | Status: DC

## 2019-02-27 NOTE — Patient Instructions (Addendum)
Medication Instructions:   START IMDUR 15 MG DAILY  TAKE METOPROLOL TARTRATE 25 MG ONCE A DAY AS NEEDED FOR ELEVATED HEART RATE   *If you need a refill on your cardiac medications before your next appointment, please call your pharmacy*  Lab Work: NONE ordered at this time of appointment   If you have labs (blood work) drawn today and your tests are completely normal, you will receive your results only by: Marland Kitchen MyChart Message (if you have MyChart) OR . A paper copy in the mail If you have any lab test that is abnormal or we need to change your treatment, we will call you to review the results.  Testing/Procedures: You have been referred to AFIB clinic PLEASE SCHEDULE FOR 1-2 WEEKS  Follow-Up: At [facility], you and your health needs are our priority.  As part of our continuing mission to provide you with exceptional heart care, we have created designated Provider Care Teams.  These Care Teams include your primary Cardiologist (physician) and Advanced Practice Providers (APPs -  Physician Assistants and Nurse Practitioners) who all work together to provide you with the care you need, when you need it.  Your next appointment:   1 month(s)  The format for your next appointment:   In Person  Provider:   You may see Sanda Klein, MD or one of the following Advanced Practice Providers on your designated Care Team:    Almyra Deforest, PA-C  Fabian Sharp, [place] or   Roby Lofts, [place]   Other Instructions

## 2019-02-28 NOTE — Progress Notes (Signed)
Thanks. Amio is only logical step I can think of.

## 2019-03-05 ENCOUNTER — Ambulatory Visit (HOSPITAL_COMMUNITY): Payer: Medicare Other | Admitting: Physician Assistant

## 2019-03-11 ENCOUNTER — Other Ambulatory Visit: Payer: Self-pay

## 2019-03-11 ENCOUNTER — Ambulatory Visit (HOSPITAL_COMMUNITY)
Admission: RE | Admit: 2019-03-11 | Discharge: 2019-03-11 | Disposition: A | Discharge status: Home / Self Care | Payer: Medicare Other | Source: Ambulatory Visit | Attending: Physician Assistant | Admitting: Physician Assistant

## 2019-03-11 ENCOUNTER — Encounter (HOSPITAL_COMMUNITY): Payer: Self-pay | Admitting: Physician Assistant

## 2019-03-11 VITALS — BP 118/74 | HR 84 | Ht 63.0 in | Wt 146.4 lb

## 2019-03-11 DIAGNOSIS — Z881 Allergy status to other antibiotic agents status: Secondary | ICD-10-CM | POA: Diagnosis not present

## 2019-03-11 DIAGNOSIS — E1122 Type 2 diabetes mellitus with diabetic chronic kidney disease: Secondary | ICD-10-CM | POA: Diagnosis not present

## 2019-03-11 DIAGNOSIS — D6869 Other thrombophilia: Secondary | ICD-10-CM | POA: Insufficient documentation

## 2019-03-11 DIAGNOSIS — I472 Ventricular tachycardia: Secondary | ICD-10-CM | POA: Diagnosis not present

## 2019-03-11 DIAGNOSIS — Z7901 Long term (current) use of anticoagulants: Secondary | ICD-10-CM | POA: Diagnosis not present

## 2019-03-11 DIAGNOSIS — Z888 Allergy status to other drugs, medicaments and biological substances status: Secondary | ICD-10-CM | POA: Diagnosis not present

## 2019-03-11 DIAGNOSIS — Z8674 Personal history of sudden cardiac arrest: Secondary | ICD-10-CM | POA: Diagnosis not present

## 2019-03-11 DIAGNOSIS — I130 Hypertensive heart and chronic kidney disease with heart failure and stage 1 through stage 4 chronic kidney disease, or unspecified chronic kidney disease: Secondary | ICD-10-CM | POA: Insufficient documentation

## 2019-03-11 DIAGNOSIS — I2510 Atherosclerotic heart disease of native coronary artery without angina pectoris: Secondary | ICD-10-CM | POA: Diagnosis not present

## 2019-03-11 DIAGNOSIS — Z79899 Other long term (current) drug therapy: Secondary | ICD-10-CM | POA: Diagnosis not present

## 2019-03-11 DIAGNOSIS — Z95810 Presence of automatic (implantable) cardiac defibrillator: Secondary | ICD-10-CM | POA: Insufficient documentation

## 2019-03-11 DIAGNOSIS — I4819 Other persistent atrial fibrillation: Secondary | ICD-10-CM | POA: Insufficient documentation

## 2019-03-11 DIAGNOSIS — N183 Chronic kidney disease, stage 3 unspecified: Secondary | ICD-10-CM | POA: Diagnosis not present

## 2019-03-11 DIAGNOSIS — Z91041 Radiographic dye allergy status: Secondary | ICD-10-CM | POA: Insufficient documentation

## 2019-03-11 DIAGNOSIS — E785 Hyperlipidemia, unspecified: Secondary | ICD-10-CM | POA: Insufficient documentation

## 2019-03-11 DIAGNOSIS — I252 Old myocardial infarction: Secondary | ICD-10-CM | POA: Diagnosis not present

## 2019-03-11 DIAGNOSIS — Z8616 Personal history of COVID-19: Secondary | ICD-10-CM | POA: Insufficient documentation

## 2019-03-11 DIAGNOSIS — I5042 Chronic combined systolic (congestive) and diastolic (congestive) heart failure: Secondary | ICD-10-CM | POA: Insufficient documentation

## 2019-03-11 DIAGNOSIS — Z823 Family history of stroke: Secondary | ICD-10-CM | POA: Diagnosis not present

## 2019-03-11 DIAGNOSIS — Z833 Family history of diabetes mellitus: Secondary | ICD-10-CM | POA: Insufficient documentation

## 2019-03-11 DIAGNOSIS — Z8249 Family history of ischemic heart disease and other diseases of the circulatory system: Secondary | ICD-10-CM | POA: Diagnosis not present

## 2019-03-11 LAB — COMPREHENSIVE METABOLIC PANEL
ALT: 20 U/L (ref 0–44)
AST: 21 U/L (ref 15–41)
Albumin: 3.4 g/dL — ABNORMAL LOW (ref 3.5–5.0)
Alkaline Phosphatase: 65 U/L (ref 38–126)
Anion gap: 10 (ref 5–15)
BUN: 17 mg/dL (ref 8–23)
CO2: 22 mmol/L (ref 22–32)
Calcium: 9.2 mg/dL (ref 8.9–10.3)
Chloride: 107 mmol/L (ref 98–111)
Creatinine, Ser: 1.2 mg/dL — ABNORMAL HIGH (ref 0.44–1.00)
GFR calc Af Amer: 48 mL/min — ABNORMAL LOW (ref >60)
GFR calc non Af Amer: 41 mL/min — ABNORMAL LOW (ref >60)
Glucose, Bld: 166 mg/dL — ABNORMAL HIGH (ref 70–99)
Potassium: 4.3 mmol/L (ref 3.5–5.1)
Sodium: 139 mmol/L (ref 135–145)
Total Bilirubin: 0.9 mg/dL (ref 0.3–1.2)
Total Protein: 6.2 g/dL — ABNORMAL LOW (ref 6.5–8.1)

## 2019-03-11 LAB — TSH: TSH: 5.095 u[IU]/mL — ABNORMAL HIGH (ref 0.350–4.500)

## 2019-03-11 MED ORDER — AMIODARONE HCL 200 MG PO TABS: ORAL_TABLET | ORAL | 0 refills | Status: DC

## 2019-03-11 NOTE — Patient Instructions (Signed)
Start Amiodarone 200mg  twice a day for 1 month (with food) then reduce to 200mg  once a day

## 2019-03-11 NOTE — Progress Notes (Signed)
Primary Care Physician: [name], DO Primary Cardiologist: Dr Sallyanne Kuster Primary Electrophysiologist: none Referring Physician: Roby Lofts PA-C/Dr Croitoru   [name] is a 86 y.o. female with a history of CAD s/p PCI to LAD/LCx in 2003, chronic combined CHF, catecholamine adrenergic polymorphic VT s/p ICD with generator change out in 2016, persistent atrial fibrillation, orthostatic hypotension, HLD, DM type 2, CKD stage 3 who presents for consultation in the [facility]. Patient is on Eliquis for a CHADS2VASC score of 5. Patient was noted to be persistently in afib after a COVID-19 infection in 12/2018. She underwent successful DCCV with Dr. Sallyanne Kuster 02/13/19. Unfortunately she presented to the ED 02/21/19 with complaints of recurrent atrial fibrillation. Patient has SOB and fatigue with her afib. She also has episodes of chest discomfort which resolve with nitro and rest.   Today, she denies symptoms of orthopnea, PND, lower extremity edema, dizziness, presyncope, syncope, snoring, daytime somnolence, bleeding, or neurologic sequela. The patient is tolerating medications without difficulties and is otherwise without complaint today.    Atrial Fibrillation Risk Factors:  she does not have symptoms or diagnosis of sleep apnea. she does not have a history of rheumatic fever.   she has a BMI of Body mass index is 25.93 kg/m.Marland Kitchen Filed Weights   03/11/19 1434  Weight: 66.4 kg    Family History  Problem Relation Age of Onset  . Stroke Mother   . Other Mother        brain tumor  . Other Father        MI  . Heart attack Father   . Stroke Sister   . Macular degeneration Sister   . Diabetes Daughter   . Cancer Daughter        ovarian  . Colon polyps Daughter   . Atrial fibrillation Sister   . Hyperlipidemia Sister   . Osteoporosis Sister   . Stroke Sister   . Uterine cancer Sister   . Heart attack Brother   . Heart disease Brother     . Asthma Brother   . Breast cancer Neg Hx      Atrial Fibrillation Management history:  Previous antiarrhythmic drugs: sotalol Previous cardioversions: 02/13/19 Previous ablations: none CHADS2VASC score: 5 Anticoagulation history: Eliquis   Past Medical History:  Diagnosis Date  . AICD (automatic cardioverter/defibrillator) present   . Allergy    SESONAL  . Anxiety   . ARTHRITIS   . Arthritis   . Atrial fibrillation ([place])   . CAD (coronary artery disease)    a. history of cardiac arrest 1993;  b. s/p LAD/LCX stenting in 2003;  c. 07/2011 Cath: LM nl, LAD patent stent, D1 80ost, LCX patent stent, RCA min irregs;  d. 04/2012 MV: EF 66%, no ishcemia;  e. 12/2013 Echo: Ef 55%, no rwma, Gr 1 DD, triv AI/MR, mildly dil LA;  f. 12/2013 Lexi MV: intermediate risk - apical ischemia and inf/infsept fixed defect, ? artifactual.  . CAD in native artery 12/31/2013   Previous stents to LAD and Ramus patent on cath today 12/31/13 also There is severe disease in the ostial first diagonal which is unchanged from most recent cardiac catheterization. The right coronary artery could not be engaged selectively but nonselective angiography showed no significant disease in the proximal and midsegment.     . Cataract    DENIES  . Chronic back pain   . Chronic sinus bradycardia   . CKD (chronic kidney disease), stage III   .  Clotting disorder (HCC)    DVT  . COLITIS 12/02/2007   Qualifier: Diagnosis of  By: Nils Pyle CMA ([place]), [name]    . Diabetes mellitus without complication ([place])    DENIES  . Diverticulosis of colon (without mention of hemorrhage) 2007   Colonoscopy   . Esophageal stricture    a. 2012 s/p dil.  . Esophagitis, unspecified    a. 2012 EGD  . EXTERNAL HEMORRHOIDS   . GERD (gastroesophageal reflux disease)    omeprazole  . H/O hiatal hernia   . Hiatal hernia    a. 2012 EGD.  Marland Kitchen History of DVT in the past, not on Coumadin now    left  leg  . HYPERCHOLESTEROLEMIA   .  Hypertension   . ICD (implantable cardiac defibrillator) in place    a. s/p initial ICD in 1993 in setting of cardiac arrest;  b. 01/2007 gen change: Guidant T135 Vitality DS VR single lead ICD.  . Macular degeneration    gets injecton in eye every 5 weeks- last injection - 05/03/2013   . Myocardial infarction ([place]) 1993  . Near syncope 05/22/2015  . Nephrolithiasis, just saw Dr [name]   . Orthostatic hypotension   . Peripheral vascular disease ([place])    ???  . RECTAL BLEEDING 12/03/2007   Qualifier: Diagnosis of  By: Sharlett Iles MD Byrd Hesselbach   . Unspecified gastritis and gastroduodenitis without mention of hemorrhage    a. 2003 EGD->not noted on 2012 EGD.  Marland Kitchen Unstable angina, neg MI, cath stable.maybe GI 12/30/2013   Past Surgical History:  Procedure Laterality Date  . BREAST BIOPSY Left 11/2013  . CARDIAC CATHETERIZATION    . CARDIAC CATHETERIZATION  01/01/15   patent stents -there is severe disease in ostial 1st diag but without change.   Marland Kitchen CARDIAC DEFIBRILLATOR PLACEMENT  02/05/2007   Guidant  . CARDIOVASCULAR STRESS TEST  12/30/2013   abnormal  . CARDIOVERSION N/A 02/13/2019   Procedure: CARDIOVERSION;  Surgeon: Sanda Klein, MD;  Location: [facility];  Service: Cardiovascular;  Laterality: N/A;  . COLONOSCOPY    . coronary stents     . CYSTOSCOPY WITH URETEROSCOPY Right 05/20/2013   Procedure: CYSTOSCOPY, RIGHT URETEROSCOPY STONE EXTRACTION, Insertion of right DOUBLE J STENT ;  Surgeon: Irine Seal, MD;  Location: [facility];  Service: Urology;  Laterality: Right;  . CYSTOSCOPY WITH URETEROSCOPY AND STENT PLACEMENT N/A 06/03/2013   Procedure: SECOND LOOK CYSTOSCOPY WITH URETEROSCOPY  HOLMIUM LASER LITHO AND STONE EXTRACTION Sammie Bench ;  Surgeon: Malka So, MD;  Location: [facility];  Service: Urology;  Laterality: N/A;  . GIVENS CAPSULE STUDY N/A 10/30/2016   Procedure: GIVENS CAPSULE STUDY;  Surgeon: Irene Shipper, MD;  Location: [facility];  Service: Endoscopy;  Laterality:  N/A;  NEEDS TO BE ADMITTED FOR OBSERVATION PT. HAS DEFIB  . HEMORROIDECTOMY  80's  . HOLMIUM LASER APPLICATION Right [id number]   Procedure: HOLMIUM LASER APPLICATION;  Surgeon: Irine Seal, MD;  Location: [facility];  Service: Urology;  Laterality: Right;  . HYSTEROSCOPY WITH D & C  09/02/2010   Procedure: DILATATION AND CURETTAGE (D&C) /HYSTEROSCOPY;  Surgeon: Margarette Asal;  Location: [facility];  Service: Gynecology;  Laterality: N/A;  Dilation and Curettage with Hysteroscopy and Polypectomy  . IMPLANTABLE CARDIOVERTER DEFIBRILLATOR (ICD) GENERATOR CHANGE N/A 02/10/2014   Procedure: ICD GENERATOR CHANGE;  Surgeon: Sanda Klein, MD;  Location: [facility];  Service: Cardiovascular;  Laterality: N/A;  . LEFT HEART CATHETERIZATION WITH CORONARY ANGIOGRAM N/A 07/28/2011  Procedure: LEFT HEART CATHETERIZATION WITH CORONARY ANGIOGRAM;  Surgeon: Lorretta Harp, MD;  Location: [facility];  Service: Cardiovascular;  Laterality: N/A;  . LEFT HEART CATHETERIZATION WITH CORONARY ANGIOGRAM N/A 12/31/2013   Procedure: LEFT HEART CATHETERIZATION WITH CORONARY ANGIOGRAM;  Surgeon: Wellington Hampshire, MD;  Location: [facility];  Service: Cardiovascular;  Laterality: N/A;    Current Outpatient Medications  Medication Sig Dispense Refill  . amLODipine (NORVASC) 2.5 MG tablet Take 1 tablet (2.5 mg total) by mouth daily. 30 tablet 3  . Calcium Citrate-Vitamin D (CITRACAL + D PO) Take 1 tablet by mouth every evening.     . carboxymethylcellulose (REFRESH PLUS) 0.5 % SOLN Place 1 drop into both eyes 3 (three) times daily as needed (dry/irritated eyes.).    Marland Kitchen diphenoxylate-atropine (LOMOTIL) 2.5-0.025 MG tablet Take 1 tablet by mouth every 8 (eight) hours as needed for diarrhea or loose stools. 30 tablet 1  . ELIQUIS 2.5 MG TABS tablet TAKE  (1)  TABLET TWICE A DAY. (Patient taking differently: Take 2.5 mg by mouth 2 (two) times daily. ) 180 tablet 1  . isosorbide mononitrate (IMDUR) 30 MG 24 hr tablet Take 0.5 tablets  (15 mg total) by mouth daily. 15 tablet 6  . loratadine (CLARITIN) 10 MG tablet Take 1 tablet (10 mg total) by mouth daily. (for itching) (Patient taking differently: Take 10 mg by mouth daily as needed ((for itching)). ) 30 tablet 11  . metoprolol tartrate (LOPRESSOR) 25 MG tablet Take 1 tablet (25 mg) daily as needed for elevated for heart rate 30 tablet 3  . metoprolol tartrate (LOPRESSOR) 50 MG tablet Take 1 tablet (50 mg total) by mouth 2 (two) times daily. 60 tablet 3  . Multiple Vitamins-Minerals (CENTRUM SILVER ULTRA WOMENS PO) Take 1 tablet by mouth daily.    . nitroGLYCERIN (NITROSTAT) 0.4 MG SL tablet Place 1 tablet (0.4 mg total) under the tongue every 5 (five) minutes as needed for chest pain. 25 tablet 2  . pantoprazole (PROTONIX) 40 MG tablet Take 1 tablet (40 mg total) by mouth every evening. 90 tablet 3  . PARoxetine (PAXIL) 20 MG tablet TAKE (1) TABLET DAILY IN THE MORNING. 90 tablet 0  . rosuvastatin (CRESTOR) 40 MG tablet TAKE (1/2) TABLET DAILY. (Patient taking differently: Take 20 mg by mouth at bedtime. ) 45 tablet 0  . torsemide (DEMADEX) 20 MG tablet Take 20 mg by mouth daily as needed (edema).    . Vitamin D, Ergocalciferol, (DRISDOL) 1.25 MG (50000 UNIT) CAPS capsule Take 1 capsule (50,000 Units total) by mouth every 7 (seven) days. 8 capsule 0  . amiodarone (PACERONE) 200 MG tablet Take 1 tablet by mouth twice a day for 1 month then reduce to 1 tablet daily 60 tablet 0   No current facility-administered medications for this encounter.    Allergies  Allergen Reactions  . Sotalol Other (See Comments)    Torsades   . Bactrim [Sulfamethoxazole-Trimethoprim]     Itchy rash  . Ciprofloxacin Nausea Only  . Actos [Pioglitazone] Swelling  . Adhesive [Tape] Other (See Comments)    Causes sores  . Contrast Media [Iodinated Diagnostic Agents] Other (See Comments)    Headache (no action/pre-med required)  . Latex Rash  . Lipitor [Atorvastatin] Other (See Comments)     myalgia    Social History   Socioeconomic History  . Marital status: Widowed    Spouse name: Not on file  . Number of children: Not on file  . Years  of education: Not on file  . Highest education level: Not on file  Occupational History  . Not on file  Tobacco Use  . Smoking status: Never Smoker  . Smokeless tobacco: Never Used  Substance and Sexual Activity  . Alcohol use: No  . Drug use: No  . Sexual activity: Never    Birth control/protection: Post-menopausal  Other Topics Concern  . Not on file  Social History Narrative  . Not on file   Social Determinants of Health   Financial Resource Strain:   . Difficulty of Paying Living Expenses: Not on file  Food Insecurity:   . Worried About Charity fundraiser in the Last Year: Not on file  . Ran Out of Food in the Last Year: Not on file  Transportation Needs:   . Lack of Transportation (Medical): Not on file  . Lack of Transportation (Non-Medical): Not on file  Physical Activity:   . Days of Exercise per Week: Not on file  . Minutes of Exercise per Session: Not on file  Stress:   . Feeling of Stress : Not on file  Social Connections:   . Frequency of Communication with Friends and Family: Not on file  . Frequency of Social Gatherings with Friends and Family: Not on file  . Attends Religious Services: Not on file  . Active Member of Clubs or Organizations: Not on file  . Attends Archivist Meetings: Not on file  . Marital Status: Not on file  Intimate Partner Violence:   . Fear of Current or Ex-Partner: Not on file  . Emotionally Abused: Not on file  . Physically Abused: Not on file  . Sexually Abused: Not on file     ROS- All systems are reviewed and negative except as per the HPI above.  Physical Exam: Vitals:   03/11/19 1434  BP: 118/74  Pulse: 84  Weight: 66.4 kg  Height: 5\' 3"  (1.6 m)    GEN- The patient is well appearing elderly female, alert and oriented x 3 today.   Head-  normocephalic, atraumatic Eyes-  Sclera clear, conjunctiva pink Ears- hearing intact Oropharynx- clear Neck- supple  Lungs- Clear to ausculation bilaterally, normal work of breathing Heart- irregular rate and rhythm, no murmurs, rubs or gallops  GI- soft, NT, ND, + BS Extremities- no clubbing, cyanosis, or edema MS- no significant deformity or atrophy Skin- no rash or lesion Psych- euthymic mood, full affect Neuro- strength and sensation are intact  Wt Readings from Last 3 Encounters:  03/11/19 66.4 kg  02/27/19 65.8 kg  02/26/19 65.3 kg    EKG today demonstrates afib HR 84, LAFB, slow R wave prog, QRS 120, QTc 472  Echo 01/21/19 demonstrated  1. Left ventricular ejection fraction, by visual estimation, is 40 to  45%. The left ventricle has normal function. There is no left ventricular  hypertrophy.  2. The left ventricle demonstrates global hypokinesis.  3. Global right ventricle has normal systolic function.The right  ventricular size is normal. No increase in right ventricular wall  thickness.  4. Left atrial size was mildly dilated.  5. Right atrial size was normal.  6. Mild mitral annular calcification.  7. The mitral valve is normal in structure. Mild mitral valve  regurgitation. No evidence of mitral stenosis.  8. The tricuspid valve is normal in structure.  9. The aortic valve is normal in structure. Aortic valve regurgitation is  mild. No evidence of aortic valve sclerosis or stenosis.  10. The pulmonic  valve was normal in structure. Pulmonic valve  regurgitation is trivial.  11. There is mild dilatation of the ascending aorta measuring 43 mm.  12. Moderately elevated pulmonary artery systolic pressure.  13. The tricuspid regurgitant velocity is 2.90 m/s, and with an assumed  right atrial pressure of 8 mmHg, the estimated right ventricular systolic  pressure is moderately elevated at 41.6 mmHg.  14. A pacer wire is visualized.  15. The inferior vena cava  is normal in size with greater than 50%  respiratory variability, suggesting right atrial pressure of 3 mmHg.   In comparison to the previous echocardiogram(s): 09/27/17 EF 55-60%. PA  pressure 37mmHg.  EF appears reduced when compared to prior.   Epic records are reviewed at length today  CHA2DS2-VASc Score = 5 The patient's score is based upon: CHF History: No HTN History: No Age : 84 + Diabetes History: Yes Stroke History: No Vascular Disease History: Yes Gender: Female      ASSESSMENT AND PLAN: 1. Persistent Atrial Fibrillation (ICD10:  I48.19) The patient's CHA2DS2-VASc score is 5, indicating a 7.2% annual risk of stroke.   S/p DCCV on 02/13/19 with ERAF. Would avoid class IC and Multaq given CAD and CHF. H/o torsades on sotalol.  We discussed therapeutic options today including amiodarone vs rate control. After discussing the risks and benefits and side effects, will plan to start amiodarone 200 mg BID for one month then decrease to 200 mg daily. Will plan for repeat DCCV after loading on amiodarone.  Check Cmet/TSH today. Continue Eliquis 5 mg BID Continue metoprolol 50 mg BID  2. Secondary Hypercoagulable State (ICD10:  D68.69) The patient is at significant risk for stroke/thromboembolism based upon her CHA2DS2-VASc Score of 5.  Continue Apixaban (Eliquis).   3. Chronic combined systolic/diastolic CHF EF [id number] on echo No signs or symptoms of fluid overload today. Followed by Dr Sallyanne Kuster. Hopefully, this will improve with SR and rate control.  4. CAD S/p PCI to LAD and LCx in 2003. Managing conservatively. Followed by Dr Sallyanne Kuster.  5. VT S/p ICD, followed by Dr Sallyanne Kuster.   Follow up with Dr Sallyanne Kuster as scheduled. AF clinic in one month.    [facility] [address] [place], [place] [address] [phone] 03/11/2019 3:24 PM

## 2019-03-12 NOTE — Progress Notes (Signed)
So she wouldn't follow my advice to start amiodarone, but you talked her into it! I need some lessons from [place].Marland KitchenMarland Kitchen

## 2019-03-17 ENCOUNTER — Encounter (HOSPITAL_COMMUNITY): Payer: Self-pay | Admitting: *Deleted

## 2019-03-20 ENCOUNTER — Other Ambulatory Visit: Payer: Self-pay | Admitting: Family Medicine

## 2019-03-20 NOTE — Telephone Encounter (Signed)
Medication is not on patient's current list. Last office visit 02/26/2019.

## 2019-03-20 NOTE — Telephone Encounter (Signed)
Patient went back on paxil.  She is no longer taking this medication

## 2019-03-21 ENCOUNTER — Other Ambulatory Visit: Payer: Self-pay

## 2019-03-21 ENCOUNTER — Encounter: Payer: Self-pay | Admitting: Cardiovascular Disease

## 2019-03-21 ENCOUNTER — Ambulatory Visit (INDEPENDENT_AMBULATORY_CARE_PROVIDER_SITE_OTHER): Payer: Medicare Other | Admitting: Cardiovascular Disease

## 2019-03-21 VITALS — BP 158/79 | HR 56 | Temp 96.3°F | Ht 63.0 in | Wt 153.2 lb

## 2019-03-21 DIAGNOSIS — I2510 Atherosclerotic heart disease of native coronary artery without angina pectoris: Secondary | ICD-10-CM

## 2019-03-21 DIAGNOSIS — I4819 Other persistent atrial fibrillation: Secondary | ICD-10-CM

## 2019-03-21 DIAGNOSIS — E782 Mixed hyperlipidemia: Secondary | ICD-10-CM

## 2019-03-21 DIAGNOSIS — E119 Type 2 diabetes mellitus without complications: Secondary | ICD-10-CM

## 2019-03-21 DIAGNOSIS — I25119 Atherosclerotic heart disease of native coronary artery with unspecified angina pectoris: Secondary | ICD-10-CM

## 2019-03-21 DIAGNOSIS — Z7901 Long term (current) use of anticoagulants: Secondary | ICD-10-CM | POA: Diagnosis not present

## 2019-03-21 DIAGNOSIS — Z95810 Presence of automatic (implantable) cardiac defibrillator: Secondary | ICD-10-CM

## 2019-03-21 DIAGNOSIS — Z5181 Encounter for therapeutic drug level monitoring: Secondary | ICD-10-CM | POA: Diagnosis not present

## 2019-03-21 DIAGNOSIS — Z79899 Other long term (current) drug therapy: Secondary | ICD-10-CM

## 2019-03-21 DIAGNOSIS — I472 Ventricular tachycardia: Secondary | ICD-10-CM | POA: Diagnosis not present

## 2019-03-21 DIAGNOSIS — I4729 Other ventricular tachycardia: Secondary | ICD-10-CM

## 2019-03-21 DIAGNOSIS — I951 Orthostatic hypotension: Secondary | ICD-10-CM

## 2019-03-21 DIAGNOSIS — N1832 Chronic kidney disease, stage 3b: Secondary | ICD-10-CM

## 2019-03-21 NOTE — Patient Instructions (Signed)

## 2019-03-21 NOTE — Progress Notes (Signed)
. Patient ID: [name], female   DOB: Aug 19, 1931, 86 y.o.   MRN: [id number]    Cardiology Office Note    Date:  03/21/2019   ID:  [name], DOB 09-25-1931, MRN [id number]  PCP:  [name], DO  Cardiologist:   Sanda Klein, MD   No chief complaint on file.   History of Present Illness:  [name] is a 86 y.o. 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  Over time she is at increasingly frequent episodes of symptomatic atrial fibrillation which has become persistent (palpitations and chest heaviness, shortness of breath and eventually leg edema).  She underwent cardioversion on February 13, 2019, with improvement in symptoms but recurrence of atrial fibrillation within a few days leading to emergency room visit on February 5.  She agreed to start amiodarone after her atrial fibrillation clinic visit on 03/11/2019.  She is back in normal rhythm today.  She feels substantially better.  She did have some nausea after the first few days of amiodarone.  She remains compliant with her anticoagulant and has not had any bleeding problems.  Irrigation of her defibrillator (single lead device) shows no episodes of high ventricular rates in September. Her device is a Office manager device was implanted as a generator change in 2016.  Estimated device longevity is another 12 years.  She never requires ventricular pacing (at least not yet).  Her ECG shows mild sinus bradycardia 57  bpm but she has a very long PR interval of 290 ms and left bundle branch block (QRS 122 ms), left axis deviation.  The QTC is borderline at 470 ms.  The patient specifically denies any chest pain at rest exertion, dyspnea at rest or with exertion, orthopnea, paroxysmal nocturnal dyspnea, syncope, palpitations, focal neurological deficits, intermittent claudication, lower extremity edema, unexplained weight gain, cough, hemoptysis or wheezing.  She was on treatment with disopyramide for years for her history of catecholaminergic polymorphic VT, but this medication had to be gradually discontinued due to side effects, especially orthostatic hypotension and dry mouth.  In the past she developed torsades de pointes with sotalol and diarrhea with quinidine.    Past Medical History:  Diagnosis Date  . AICD (automatic cardioverter/defibrillator) present   . Allergy    SESONAL  . Anxiety   . ARTHRITIS   . Arthritis   . Atrial fibrillation ([place])   . CAD (coronary artery disease)    a. history of cardiac arrest 1993;  b. s/p LAD/LCX stenting in 2003;  c. 07/2011 Cath: LM nl, LAD patent stent, D1 80ost, LCX patent stent, RCA min irregs;  d. 04/2012 MV: EF 66%, no ishcemia;  e. 12/2013 Echo: Ef 55%, no rwma, Gr 1 DD, triv AI/MR, mildly dil LA;  f. 12/2013 Lexi MV: intermediate risk - apical ischemia and inf/infsept fixed defect, ? artifactual.  . CAD in native artery 12/31/2013   Previous stents to LAD and Ramus patent on cath today 12/31/13 also There is severe disease in the ostial first diagonal which is unchanged from most recent cardiac catheterization. The right coronary artery could not  be engaged selectively but nonselective angiography showed no significant disease in the proximal and midsegment.     . Cataract    DENIES  . Chronic back pain   . Chronic sinus bradycardia   . CKD (chronic kidney disease), stage III   . Clotting disorder (HCC)    DVT  . COLITIS 12/02/2007   Qualifier: Diagnosis  of  By: Nils Pyle CMA ([place]), [name]    . Diabetes mellitus without complication ([place])    DENIES  . Diverticulosis of colon (without mention of hemorrhage) 2007   Colonoscopy   . Esophageal stricture    a. 2012 s/p dil.  . Esophagitis, unspecified    a. 2012 EGD  . EXTERNAL HEMORRHOIDS   . GERD (gastroesophageal reflux disease)    omeprazole  . H/O hiatal hernia   . Hiatal hernia    a. 2012 EGD.  Marland Kitchen History of DVT in the past, not on Coumadin now    left  leg  . HYPERCHOLESTEROLEMIA   . Hypertension   . ICD (implantable cardiac defibrillator) in place    a. s/p initial ICD in 1993 in setting of cardiac arrest;  b. 01/2007 gen change: Guidant T135 Vitality DS VR single lead ICD.  . Macular degeneration    gets injecton in eye every 5 weeks- last injection - 05/03/2013   . Myocardial infarction ([place]) 1993  . Near syncope 05/22/2015  . Nephrolithiasis, just saw Dr [name]   . Orthostatic hypotension   . Peripheral vascular disease ([place])    ???  . RECTAL BLEEDING 12/03/2007   Qualifier: Diagnosis of  By: Sharlett Iles MD Byrd Hesselbach   . Unspecified gastritis and gastroduodenitis without mention of hemorrhage    a. 2003 EGD->not noted on 2012 EGD.  Marland Kitchen Unstable angina, neg MI, cath stable.maybe GI 12/30/2013    Past Surgical History:  Procedure Laterality Date  . BREAST BIOPSY Left 11/2013  . CARDIAC CATHETERIZATION    . CARDIAC CATHETERIZATION  01/01/15   patent stents -there is severe disease in ostial 1st diag but without change.   Marland Kitchen CARDIAC DEFIBRILLATOR PLACEMENT  02/05/2007   Guidant  . CARDIOVASCULAR STRESS TEST  12/30/2013   abnormal  . CARDIOVERSION N/A 02/13/2019   Procedure: CARDIOVERSION;  Surgeon: Sanda Klein, MD;  Location: [facility];  Service: Cardiovascular;  Laterality: N/A;  . COLONOSCOPY    . coronary stents     . CYSTOSCOPY WITH URETEROSCOPY Right 05/20/2013   Procedure: CYSTOSCOPY, RIGHT URETEROSCOPY STONE EXTRACTION, Insertion of right DOUBLE J STENT ;   Surgeon: Irine Seal, MD;  Location: [facility];  Service: Urology;  Laterality: Right;  . CYSTOSCOPY WITH URETEROSCOPY AND STENT PLACEMENT N/A 06/03/2013   Procedure: SECOND LOOK CYSTOSCOPY WITH URETEROSCOPY  HOLMIUM LASER LITHO AND STONE EXTRACTION Sammie Bench ;  Surgeon: Malka So, MD;  Location: [facility];  Service: Urology;  Laterality: N/A;  . GIVENS CAPSULE STUDY N/A 10/30/2016   Procedure: GIVENS CAPSULE STUDY;  Surgeon: Irene Shipper, MD;  Location: [facility];  Service: Endoscopy;  Laterality: N/A;  NEEDS TO BE ADMITTED FOR OBSERVATION PT. HAS DEFIB  . HEMORROIDECTOMY  80's  . HOLMIUM LASER APPLICATION Right 09/25/3714   Procedure: HOLMIUM LASER APPLICATION;  Surgeon: Irine Seal, MD;  Location: [facility];  Service: Urology;  Laterality: Right;  . HYSTEROSCOPY WITH D & C  09/02/2010   Procedure: DILATATION AND CURETTAGE (D&C) /HYSTEROSCOPY;  Surgeon: Margarette Asal;  Location: [facility];  Service: Gynecology;  Laterality: N/A;  Dilation and Curettage with Hysteroscopy and Polypectomy  . IMPLANTABLE CARDIOVERTER DEFIBRILLATOR (ICD) GENERATOR CHANGE N/A 02/10/2014   Procedure: ICD GENERATOR CHANGE;  Surgeon: Sanda Klein, MD;  Location: [facility];  Service: Cardiovascular;  Laterality: N/A;  . LEFT HEART CATHETERIZATION WITH CORONARY ANGIOGRAM N/A 07/28/2011   Procedure: LEFT HEART CATHETERIZATION WITH CORONARY ANGIOGRAM;  Surgeon: Lorretta Harp, MD;  Location: [facility];  Service: Cardiovascular;  Laterality: N/A;  . LEFT HEART CATHETERIZATION WITH CORONARY ANGIOGRAM N/A 12/31/2013   Procedure: LEFT HEART CATHETERIZATION WITH CORONARY ANGIOGRAM;  Surgeon: Wellington Hampshire, MD;  Location: [facility];  Service: Cardiovascular;  Laterality: N/A;    Current Medications: Outpatient Medications Prior to Visit  Medication Sig Dispense Refill  . amiodarone (PACERONE) 200 MG tablet Take 1 tablet by mouth twice a day for 1 month then reduce to 1 tablet daily 60 tablet 0  . amLODipine (NORVASC) 2.5 MG  tablet Take 1 tablet (2.5 mg total) by mouth daily. 30 tablet 3  . Calcium Citrate-Vitamin D (CITRACAL + D PO) Take 1 tablet by mouth every evening.     . carboxymethylcellulose (REFRESH PLUS) 0.5 % SOLN Place 1 drop into both eyes 3 (three) times daily as needed (dry/irritated eyes.).    Marland Kitchen diphenoxylate-atropine (LOMOTIL) 2.5-0.025 MG tablet Take 1 tablet by mouth every 8 (eight) hours as needed for diarrhea or loose stools. 30 tablet 1  . ELIQUIS 2.5 MG TABS tablet TAKE  (1)  TABLET TWICE A DAY. (Patient taking differently: Take 2.5 mg by mouth 2 (two) times daily. ) 180 tablet 1  . isosorbide mononitrate (IMDUR) 30 MG 24 hr tablet Take 0.5 tablets (15 mg total) by mouth daily. 15 tablet 6  . loratadine (CLARITIN) 10 MG tablet Take 1 tablet (10 mg total) by mouth daily. (for itching) (Patient taking differently: Take 10 mg by mouth daily as needed ((for itching)). ) 30 tablet 11  . metoprolol tartrate (LOPRESSOR) 25 MG tablet Take 1 tablet (25 mg) daily as needed for elevated for heart rate 30 tablet 3  . metoprolol tartrate (LOPRESSOR) 50 MG tablet Take 1 tablet (50 mg total) by mouth 2 (two) times daily. 60 tablet 3  . Multiple Vitamins-Minerals (CENTRUM SILVER ULTRA WOMENS PO) Take 1 tablet by mouth daily.    . nitroGLYCERIN (NITROSTAT) 0.4 MG SL tablet Place 1 tablet (0.4 mg total) under the tongue every 5 (five) minutes as needed for chest pain. 25 tablet 2  . pantoprazole (PROTONIX) 40 MG tablet Take 1 tablet (40 mg total) by mouth every evening. 90 tablet 3  . PARoxetine (PAXIL) 20 MG tablet TAKE (1) TABLET DAILY IN THE MORNING. 90 tablet 0  . rosuvastatin (CRESTOR) 40 MG tablet TAKE (1/2) TABLET DAILY. (Patient taking differently: Take 20 mg by mouth at bedtime. ) 45 tablet 0  . torsemide (DEMADEX) 20 MG tablet Take 20 mg by mouth daily as needed (edema).    . Vitamin D, Ergocalciferol, (DRISDOL) 1.25 MG (50000 UNIT) CAPS capsule Take 1 capsule (50,000 Units total) by mouth every 7 (seven)  days. 8 capsule 0   No facility-administered medications prior to visit.     Allergies:   Sotalol, Bactrim [sulfamethoxazole-trimethoprim], Ciprofloxacin, Actos [pioglitazone], Adhesive [tape], Contrast media [iodinated diagnostic agents], Latex, and Lipitor [atorvastatin]   Social History   Socioeconomic History  . Marital status: Widowed    Spouse name: Not on file  . Number of children: Not on file  . Years of education: Not on file  .  Highest education level: Not on file  Occupational History  . Not on file  Tobacco Use  . Smoking status: Never Smoker  . Smokeless tobacco: Never Used  Substance and Sexual Activity  . Alcohol use: No  . Drug use: No  . Sexual activity: Never    Birth control/protection: Post-menopausal  Other Topics Concern  . Not on file  Social History Narrative  . Not on file   Social Determinants of Health   Financial Resource Strain:   . Difficulty of Paying Living Expenses: Not on file  Food Insecurity:   . Worried About Charity fundraiser in the Last Year: Not on file  . Ran Out of Food in the Last Year: Not on file  Transportation Needs:   . Lack of Transportation (Medical): Not on file  . Lack of Transportation (Non-Medical): Not on file  Physical Activity:   . Days of Exercise per Week: Not on file  . Minutes of Exercise per Session: Not on file  Stress:   . Feeling of Stress : Not on file  Social Connections:   . Frequency of Communication with Friends and Family: Not on file  . Frequency of Social Gatherings with Friends and Family: Not on file  . Attends Religious Services: Not on file  . Active Member of Clubs or Organizations: Not on file  . Attends Archivist Meetings: Not on file  . Marital Status: Not on file     Family History:  The patient's family history includes Asthma in her brother; Atrial fibrillation in her sister; Cancer in her daughter; Colon polyps in her daughter; Diabetes in her daughter; Heart attack  in her brother and father; Heart disease in her brother; Hyperlipidemia in her sister; Macular degeneration in her sister; Osteoporosis in her sister; Other in her father and mother; Stroke in her mother, sister, and sister; Uterine cancer in her sister.   ROS:   Please see the history of present illness.    ROS all other systems are reviewed and are negative  PHYSICAL EXAM:   VS:  BP (!) 158/79   Pulse (!) 56   Temp (!) 96.3 F (35.7 C)   Ht 5\' 3"  (1.6 m)   Wt 153 lb 3.2 oz (69.5 kg)   SpO2 97%   BMI 27.14 kg/m      General: Alert, oriented x3, no distress, appears well, minimally overweight, healthy subclavian defibrillator site Head: no evidence of trauma, PERRL, EOMI, no exophtalmos or lid lag, no myxedema, no xanthelasma; normal ears, nose and oropharynx Neck: normal jugular venous pulsations and no hepatojugular reflux; brisk carotid pulses without delay and no carotid bruits Chest: clear to auscultation, no signs of consolidation by percussion or palpation, normal fremitus, symmetrical and full respiratory excursions Cardiovascular: normal position and quality of the apical impulse, regular rhythm, normal first and second heart sounds, no murmurs, rubs or gallops Abdomen: no tenderness or distention, no masses by palpation, no abnormal pulsatility or arterial bruits, normal bowel sounds, no hepatosplenomegaly Extremities: no clubbing, cyanosis or edema; 2+ radial, ulnar and brachial pulses bilaterally; 2+ right femoral, posterior tibial and dorsalis pedis pulses; 2+ left femoral, posterior tibial and dorsalis pedis pulses; no subclavian or femoral bruits Neurological: grossly nonfocal Psych: Normal mood and affect\  Wt Readings from Last 3 Encounters:  03/21/19 153 lb 3.2 oz (69.5 kg)  03/11/19 146 lb 6.4 oz (66.4 kg)  02/27/19 145 lb (65.8 kg)    Studies/Labs Reviewed:   EKG:  EKG is ordered today and is similar with previous tracings showing mild sinus bradycardia,  first-degree AV block, left bundle branch block and left axis deviation.  There is a striking prolongation of the PR interval which is now 290 ms.  The QTC is borderline at 475 ms.   Recent Labs: 01/09/2019: Magnesium 1.9 02/21/2019: Hemoglobin 11.9; Platelets 274 03/11/2019: ALT 20; BUN 17; Creatinine, Ser 1.20; Potassium 4.3; Sodium 139; TSH 5.095   Lipid Panel    Component Value Date/Time   CHOL 139 10/23/2018 0947   CHOL 140 07/31/2012 1019   TRIG 211 (H) 12/28/2018 1601   TRIG 115 06/07/2015 1111   TRIG 161 (H) 07/31/2012 1019   HDL 53 10/23/2018 0947   HDL 68 06/07/2015 1111   HDL 43 07/31/2012 1019   CHOLHDL 2.6 10/23/2018 0947   CHOLHDL 2.8 09/02/2010 0629   VLDL 34 09/02/2010 0629   LDLCALC 65 10/23/2018 0947   LDLCALC 65 09/09/2013 0841   LDLCALC 65 07/31/2012 1019     ASSESSMENT:    1. Persistent atrial fibrillation ([place])   2. Encounter for monitoring amiodarone therapy   3. Catecholaminergic polymorphic ventricular tachycardia ([place])   4. Long term current use of anticoagulant   5. Coronary artery disease involving native coronary artery of native heart without angina pectoris   6. Orthostatic hypotension   7. Diabetes mellitus type 2, diet-controlled ([place])   8. Stage 3b chronic kidney disease   9. ICD (implantable cardioverter-defibrillator) in place   10. Hyperlipidemia, mixed      PLAN:  In order of problems listed above:  1. AFib: Clearly feels better now that she is back in sinus rhythm.  Had early recurrence of arrhythmia after cardioversion but is back in normal rhythm after starting amiodarone.    She is fully anticoagulated. CHADSVasc 6 (age 88, gender, DM, CAD, hypertension).   2. Amiodarone: Has had some mild nausea but this has improved.  I suspect will get even better after we reduce to the 200 mg daily maintenance dose.  Need to watch carefully for evidence of advanced AV block since she now has a very long PR interval.  I did continue the  beta-blocker for the time being, but plan to discontinue it towards the end of the month, when she will also reduce the amiodarone dose.  She has an appointment in the A. fib clinic on March 23.  That would be a good opportunity to reevaluate her ECG and stop the beta-blocker.  She will not have severe bradycardia since she has backup ventricular pacing at 50 bpm.  So far, no evidence of ventricular proarrhythmia.  Also reviewed the potential for other side effects with amiodarone including the need for routine liver and thyroid monitoring, avoiding sun exposure, yearly eye exam, promptly reporting any unexplained breathing issues. 3. VT: She has not had any therapy from her defibrillator since the early 1990s.  She did have a rather lengthy 25 beat nonsustained VT run in September when we decreased her beta-blocker dose due to bradycardia.  Amiodarone should be helpful.  Disopyramide was stopped several years ago due to complaints of dry mouth and orthostatic hypotension.  Continue to monitor. 4. Eliquis: Dose adjusted for age and worsening renal function at the creatinine has improved.  Well-tolerated without bleeding complications. 5. CAD: She does not have angina pectoris, except she does have some chest heaviness during episodes of atrial fibrillation.  She has normal left ventricular systolic function.  She has not required revascularization  since 2003.  Her last assessment was a cardiac catheterization 2015 that did not show any significant coronary stenoses. 6. HTN/Orthostatic hypotension: Adequate control.  Does not have any dizziness 7. DM: Controlled with diet, recent hemoglobin A1c 7.0% 8. CKD 3b: Creatinine has stabilized at improved level,  now with a GFR of approximately 40 (serum creatinine around 1.2) 9. ICD: Normal device function.  Demonstrated the alert tones.  Remote download in 3 months, follow-up visit in 6 months. 10. HLP: On statin with lipid parameters at target 5 last October.   Borderline elevation in triglycerides does not justify additional medications.  55 minutes discussing rhythm treatment options.  Medication Adjustments/Labs and Tests Ordered: Current medicines are reviewed at length with the patient today.  Concerns regarding medicines are outlined above.  Medication changes, Labs and Tests ordered today are listed in the Patient Instructions below. Patient Instructions  Medication Instructions:  No changes *If you need a refill on your cardiac medications before your next appointment, please call your pharmacy*   Lab Work: None ordered If you have labs (blood work) drawn today and your tests are completely normal, you will receive your results only by: Marland Kitchen MyChart Message (if you have MyChart) OR . A paper copy in the mail If you have any lab test that is abnormal or we need to change your treatment, we will call you to review the results.   Testing/Procedures: None ordered   Follow-Up: At [facility], you and your health needs are our priority.  As part of our continuing mission to provide you with exceptional heart care, we have created designated Provider Care Teams.  These Care Teams include your primary Cardiologist (physician) and Advanced Practice Providers (APPs -  Physician Assistants and Nurse Practitioners) who all work together to provide you with the care you need, when you need it.  We recommend signing up for the patient portal called "MyChart".  Sign up information is provided on this After Visit Summary.  MyChart is used to connect with patients for Virtual Visits (Telemedicine).  Patients are able to view lab/test results, encounter notes, upcoming appointments, etc.  Non-urgent messages can be sent to your provider as well.   To learn more about what you can do with MyChart, go to [email or web address].    Your next appointment:   6 month(s)  The format for your next appointment:   In Person  Provider:   Sanda Klein, MD         Signed, Sanda Klein, MD  03/21/2019 1:05 PM    [facility] [address], [place], [place]  [address] Phone: [phone]; Fax: [phone]

## 2019-03-24 ENCOUNTER — Other Ambulatory Visit: Payer: Self-pay | Admitting: Family Medicine

## 2019-03-24 NOTE — Telephone Encounter (Signed)
Last office visit 02/26/2019 Last refill 05/16/2018, #30, 1 refill

## 2019-03-31 ENCOUNTER — Other Ambulatory Visit: Payer: Self-pay | Admitting: Family Medicine

## 2019-03-31 ENCOUNTER — Other Ambulatory Visit: Payer: Self-pay | Admitting: *Deleted

## 2019-04-08 ENCOUNTER — Ambulatory Visit (HOSPITAL_COMMUNITY)
Admission: RE | Admit: 2019-04-08 | Discharge: 2019-04-08 | Disposition: A | Discharge status: Home / Self Care | Payer: Medicare Other | Source: Ambulatory Visit | Attending: Physician Assistant | Admitting: Physician Assistant

## 2019-04-08 ENCOUNTER — Encounter (HOSPITAL_COMMUNITY): Payer: Self-pay | Admitting: Physician Assistant

## 2019-04-08 ENCOUNTER — Other Ambulatory Visit: Payer: Self-pay

## 2019-04-08 VITALS — BP 142/78 | HR 55 | Ht 63.0 in | Wt 138.0 lb

## 2019-04-08 DIAGNOSIS — Z79899 Other long term (current) drug therapy: Secondary | ICD-10-CM | POA: Insufficient documentation

## 2019-04-08 DIAGNOSIS — I5042 Chronic combined systolic (congestive) and diastolic (congestive) heart failure: Secondary | ICD-10-CM | POA: Insufficient documentation

## 2019-04-08 DIAGNOSIS — I2510 Atherosclerotic heart disease of native coronary artery without angina pectoris: Secondary | ICD-10-CM | POA: Diagnosis not present

## 2019-04-08 DIAGNOSIS — Z91040 Latex allergy status: Secondary | ICD-10-CM | POA: Insufficient documentation

## 2019-04-08 DIAGNOSIS — F419 Anxiety disorder, unspecified: Secondary | ICD-10-CM | POA: Diagnosis not present

## 2019-04-08 DIAGNOSIS — Z881 Allergy status to other antibiotic agents status: Secondary | ICD-10-CM | POA: Insufficient documentation

## 2019-04-08 DIAGNOSIS — Z8249 Family history of ischemic heart disease and other diseases of the circulatory system: Secondary | ICD-10-CM | POA: Insufficient documentation

## 2019-04-08 DIAGNOSIS — M1990 Unspecified osteoarthritis, unspecified site: Secondary | ICD-10-CM | POA: Diagnosis not present

## 2019-04-08 DIAGNOSIS — Z8041 Family history of malignant neoplasm of ovary: Secondary | ICD-10-CM | POA: Diagnosis not present

## 2019-04-08 DIAGNOSIS — I4819 Other persistent atrial fibrillation: Secondary | ICD-10-CM

## 2019-04-08 DIAGNOSIS — I130 Hypertensive heart and chronic kidney disease with heart failure and stage 1 through stage 4 chronic kidney disease, or unspecified chronic kidney disease: Secondary | ICD-10-CM | POA: Insufficient documentation

## 2019-04-08 DIAGNOSIS — Z833 Family history of diabetes mellitus: Secondary | ICD-10-CM | POA: Diagnosis not present

## 2019-04-08 DIAGNOSIS — D6869 Other thrombophilia: Secondary | ICD-10-CM | POA: Diagnosis not present

## 2019-04-08 DIAGNOSIS — I252 Old myocardial infarction: Secondary | ICD-10-CM | POA: Diagnosis not present

## 2019-04-08 DIAGNOSIS — K219 Gastro-esophageal reflux disease without esophagitis: Secondary | ICD-10-CM | POA: Insufficient documentation

## 2019-04-08 DIAGNOSIS — Z8349 Family history of other endocrine, nutritional and metabolic diseases: Secondary | ICD-10-CM | POA: Diagnosis not present

## 2019-04-08 DIAGNOSIS — E7800 Pure hypercholesterolemia, unspecified: Secondary | ICD-10-CM | POA: Insufficient documentation

## 2019-04-08 DIAGNOSIS — Z95810 Presence of automatic (implantable) cardiac defibrillator: Secondary | ICD-10-CM | POA: Insufficient documentation

## 2019-04-08 DIAGNOSIS — Z7901 Long term (current) use of anticoagulants: Secondary | ICD-10-CM | POA: Insufficient documentation

## 2019-04-08 DIAGNOSIS — Z955 Presence of coronary angioplasty implant and graft: Secondary | ICD-10-CM | POA: Diagnosis not present

## 2019-04-08 DIAGNOSIS — Z8262 Family history of osteoporosis: Secondary | ICD-10-CM | POA: Insufficient documentation

## 2019-04-08 DIAGNOSIS — Z8049 Family history of malignant neoplasm of other genital organs: Secondary | ICD-10-CM | POA: Diagnosis not present

## 2019-04-08 DIAGNOSIS — Z888 Allergy status to other drugs, medicaments and biological substances status: Secondary | ICD-10-CM | POA: Insufficient documentation

## 2019-04-08 DIAGNOSIS — N183 Chronic kidney disease, stage 3 unspecified: Secondary | ICD-10-CM | POA: Diagnosis not present

## 2019-04-08 DIAGNOSIS — H3530 Unspecified macular degeneration: Secondary | ICD-10-CM | POA: Diagnosis not present

## 2019-04-08 DIAGNOSIS — Z86718 Personal history of other venous thrombosis and embolism: Secondary | ICD-10-CM | POA: Insufficient documentation

## 2019-04-08 DIAGNOSIS — Z823 Family history of stroke: Secondary | ICD-10-CM | POA: Diagnosis not present

## 2019-04-08 DIAGNOSIS — Z882 Allergy status to sulfonamides status: Secondary | ICD-10-CM | POA: Insufficient documentation

## 2019-04-08 DIAGNOSIS — I4891 Unspecified atrial fibrillation: Secondary | ICD-10-CM | POA: Diagnosis present

## 2019-04-08 MED ORDER — AMIODARONE HCL 200 MG PO TABS: 200.0000 mg | ORAL_TABLET | Freq: Every day | ORAL | 3 refills | Status: DC

## 2019-04-08 NOTE — Patient Instructions (Signed)
Stop daily metoprolol 50mg    Continue amiodarone 200mg  once a day

## 2019-04-08 NOTE — Progress Notes (Signed)
Primary Care Physician: [name], DO Primary Cardiologist: Dr Sallyanne Kuster Primary Electrophysiologist: none Referring Physician: Roby Lofts PA-C/Dr Croitoru   [name] is a 86 y.o. female with a history of CAD s/p PCI to LAD/LCx in 2003, chronic combined CHF, catecholamine adrenergic polymorphic VT s/p ICD with generator change out in 2016, persistent atrial fibrillation, orthostatic hypotension, HLD, DM type 2, CKD stage 3 who presents for follow up in the [facility]. Patient is on Eliquis for a CHADS2VASC score of 5. Patient was noted to be persistently in afib after a COVID-19 infection in 12/2018. She underwent successful DCCV with Dr. Sallyanne Kuster 02/13/19. Unfortunately she presented to the ED 02/21/19 with complaints of recurrent atrial fibrillation. Patient has SOB and fatigue with her afib. She also has episodes of chest discomfort which resolve with nitro and rest.   On follow up today, patient reports that she has done reasonably well since her last visit. She does have some dizziness. She did have SOB this morning but this has resolved. She is tolerating amiodarone with some mild nausea. She is in SR.  Today, she denies symptoms of palpitations, chest pain, orthopnea, PND, lower extremity edema, dizziness, presyncope, syncope, snoring, daytime somnolence, bleeding, or neurologic sequela. The patient is tolerating medications without difficulties and is otherwise without complaint today.    Atrial Fibrillation Risk Factors:  she does not have symptoms or diagnosis of sleep apnea. she does not have a history of rheumatic fever.   she has a BMI of Body mass index is 24.45 kg/m.Marland Kitchen Filed Weights   04/08/19 1505  Weight: 62.6 kg    Family History  Problem Relation Age of Onset  . Stroke Mother   . Other Mother        brain tumor  . Other Father        MI  . Heart attack Father   . Stroke Sister   . Macular degeneration Sister   .  Diabetes Daughter   . Cancer Daughter        ovarian  . Colon polyps Daughter   . Atrial fibrillation Sister   . Hyperlipidemia Sister   . Osteoporosis Sister   . Stroke Sister   . Uterine cancer Sister   . Heart attack Brother   . Heart disease Brother   . Asthma Brother   . Breast cancer Neg Hx      Atrial Fibrillation Management history:  Previous antiarrhythmic drugs: sotalol, amiodarone Previous cardioversions: 02/13/19 Previous ablations: none CHADS2VASC score: 5 Anticoagulation history: Eliquis   Past Medical History:  Diagnosis Date  . AICD (automatic cardioverter/defibrillator) present   . Allergy    SESONAL  . Anxiety   . ARTHRITIS   . Arthritis   . Atrial fibrillation ([place])   . CAD (coronary artery disease)    a. history of cardiac arrest 1993;  b. s/p LAD/LCX stenting in 2003;  c. 07/2011 Cath: LM nl, LAD patent stent, D1 80ost, LCX patent stent, RCA min irregs;  d. 04/2012 MV: EF 66%, no ishcemia;  e. 12/2013 Echo: Ef 55%, no rwma, Gr 1 DD, triv AI/MR, mildly dil LA;  f. 12/2013 Lexi MV: intermediate risk - apical ischemia and inf/infsept fixed defect, ? artifactual.  . CAD in native artery 12/31/2013   Previous stents to LAD and Ramus patent on cath today 12/31/13 also There is severe disease in the ostial first diagonal which is unchanged from most recent cardiac catheterization. The right coronary artery  could not be engaged selectively but nonselective angiography showed no significant disease in the proximal and midsegment.     . Cataract    DENIES  . Chronic back pain   . Chronic sinus bradycardia   . CKD (chronic kidney disease), stage III   . Clotting disorder (HCC)    DVT  . COLITIS 12/02/2007   Qualifier: Diagnosis of  By: Nils Pyle CMA ([place]), [name]    . Diabetes mellitus without complication ([place])    DENIES  . Diverticulosis of colon (without mention of hemorrhage) 2007   Colonoscopy   . Esophageal stricture    a. 2012 s/p dil.  . Esophagitis,  unspecified    a. 2012 EGD  . EXTERNAL HEMORRHOIDS   . GERD (gastroesophageal reflux disease)    omeprazole  . H/O hiatal hernia   . Hiatal hernia    a. 2012 EGD.  Marland Kitchen History of DVT in the past, not on Coumadin now    left  leg  . HYPERCHOLESTEROLEMIA   . Hypertension   . ICD (implantable cardiac defibrillator) in place    a. s/p initial ICD in 1993 in setting of cardiac arrest;  b. 01/2007 gen change: Guidant T135 Vitality DS VR single lead ICD.  . Macular degeneration    gets injecton in eye every 5 weeks- last injection - 05/03/2013   . Myocardial infarction ([place]) 1993  . Near syncope 05/22/2015  . Nephrolithiasis, just saw Dr [name]   . Orthostatic hypotension   . Peripheral vascular disease ([place])    ???  . RECTAL BLEEDING 12/03/2007   Qualifier: Diagnosis of  By: Sharlett Iles MD Byrd Hesselbach   . Unspecified gastritis and gastroduodenitis without mention of hemorrhage    a. 2003 EGD->not noted on 2012 EGD.  Marland Kitchen Unstable angina, neg MI, cath stable.maybe GI 12/30/2013   Past Surgical History:  Procedure Laterality Date  . BREAST BIOPSY Left 11/2013  . CARDIAC CATHETERIZATION    . CARDIAC CATHETERIZATION  01/01/15   patent stents -there is severe disease in ostial 1st diag but without change.   Marland Kitchen CARDIAC DEFIBRILLATOR PLACEMENT  02/05/2007   Guidant  . CARDIOVASCULAR STRESS TEST  12/30/2013   abnormal  . CARDIOVERSION N/A 02/13/2019   Procedure: CARDIOVERSION;  Surgeon: Sanda Klein, MD;  Location: [facility];  Service: Cardiovascular;  Laterality: N/A;  . COLONOSCOPY    . coronary stents     . CYSTOSCOPY WITH URETEROSCOPY Right 05/20/2013   Procedure: CYSTOSCOPY, RIGHT URETEROSCOPY STONE EXTRACTION, Insertion of right DOUBLE J STENT ;  Surgeon: Irine Seal, MD;  Location: [facility];  Service: Urology;  Laterality: Right;  . CYSTOSCOPY WITH URETEROSCOPY AND STENT PLACEMENT N/A 06/03/2013   Procedure: SECOND LOOK CYSTOSCOPY WITH URETEROSCOPY  HOLMIUM LASER LITHO AND STONE  EXTRACTION Sammie Bench ;  Surgeon: Malka So, MD;  Location: [facility];  Service: Urology;  Laterality: N/A;  . GIVENS CAPSULE STUDY N/A 10/30/2016   Procedure: GIVENS CAPSULE STUDY;  Surgeon: Irene Shipper, MD;  Location: [facility];  Service: Endoscopy;  Laterality: N/A;  NEEDS TO BE ADMITTED FOR OBSERVATION PT. HAS DEFIB  . HEMORROIDECTOMY  80's  . HOLMIUM LASER APPLICATION Right 5/0/3888   Procedure: HOLMIUM LASER APPLICATION;  Surgeon: Irine Seal, MD;  Location: [facility];  Service: Urology;  Laterality: Right;  . HYSTEROSCOPY WITH D & C  09/02/2010   Procedure: DILATATION AND CURETTAGE (D&C) /HYSTEROSCOPY;  Surgeon: Margarette Asal;  Location: [facility];  Service: Gynecology;  Laterality: N/A;  Dilation and Curettage with Hysteroscopy and Polypectomy  . IMPLANTABLE CARDIOVERTER DEFIBRILLATOR (ICD) GENERATOR CHANGE N/A 02/10/2014   Procedure: ICD GENERATOR CHANGE;  Surgeon: Sanda Klein, MD;  Location: [facility];  Service: Cardiovascular;  Laterality: N/A;  . LEFT HEART CATHETERIZATION WITH CORONARY ANGIOGRAM N/A 07/28/2011   Procedure: LEFT HEART CATHETERIZATION WITH CORONARY ANGIOGRAM;  Surgeon: Lorretta Harp, MD;  Location: [facility];  Service: Cardiovascular;  Laterality: N/A;  . LEFT HEART CATHETERIZATION WITH CORONARY ANGIOGRAM N/A 12/31/2013   Procedure: LEFT HEART CATHETERIZATION WITH CORONARY ANGIOGRAM;  Surgeon: Wellington Hampshire, MD;  Location: [facility];  Service: Cardiovascular;  Laterality: N/A;    Current Outpatient Medications  Medication Sig Dispense Refill  . amiodarone (PACERONE) 200 MG tablet Take 1 tablet (200 mg total) by mouth daily. 30 tablet 3  . amLODipine (NORVASC) 2.5 MG tablet Take 1 tablet (2.5 mg total) by mouth daily. 30 tablet 3  . Calcium Citrate-Vitamin D (CITRACAL + D PO) Take 1 tablet by mouth every evening.     . carboxymethylcellulose (REFRESH PLUS) 0.5 % SOLN Place 1 drop into both eyes 3 (three) times daily as needed (dry/irritated eyes.).    Marland Kitchen  diphenoxylate-atropine (LOMOTIL) 2.5-0.025 MG tablet TAKE 1 TABLET EVERY 8 HOURS AS NEEDED FOR DIARRHEA 30 tablet 0  . ELIQUIS 2.5 MG TABS tablet TAKE  (1)  TABLET TWICE A DAY. (Patient taking differently: Take 2.5 mg by mouth 2 (two) times daily. ) 180 tablet 1  . isosorbide mononitrate (IMDUR) 30 MG 24 hr tablet Take 0.5 tablets (15 mg total) by mouth daily. 15 tablet 6  . loratadine (CLARITIN) 10 MG tablet Take 1 tablet (10 mg total) by mouth daily. (for itching) (Patient taking differently: Take 10 mg by mouth daily as needed ((for itching)). ) 30 tablet 11  . metoprolol tartrate (LOPRESSOR) 25 MG tablet Take 1 tablet (25 mg) daily as needed for elevated for heart rate 30 tablet 3  . Multiple Vitamins-Minerals (CENTRUM SILVER ULTRA WOMENS PO) Take 1 tablet by mouth daily.    . nitroGLYCERIN (NITROSTAT) 0.4 MG SL tablet Place 1 tablet (0.4 mg total) under the tongue every 5 (five) minutes as needed for chest pain. 25 tablet 2  . pantoprazole (PROTONIX) 40 MG tablet Take 1 tablet (40 mg total) by mouth every evening. 90 tablet 3  . PARoxetine (PAXIL) 20 MG tablet TAKE (1) TABLET DAILY IN THE MORNING. 90 tablet 0  . rosuvastatin (CRESTOR) 40 MG tablet TAKE (1/2) TABLET DAILY. (Patient taking differently: Take 20 mg by mouth at bedtime. ) 45 tablet 0  . torsemide (DEMADEX) 20 MG tablet Take 1 tablet (20 mg total) by mouth daily as needed (edema). 30 tablet 0  . Vitamin D, Ergocalciferol, (DRISDOL) 1.25 MG (50000 UNIT) CAPS capsule Take 1 capsule (50,000 Units total) by mouth every 7 (seven) days. 8 capsule 0   No current facility-administered medications for this encounter.    Allergies  Allergen Reactions  . Sotalol Other (See Comments)    Torsades   . Bactrim [Sulfamethoxazole-Trimethoprim]     Itchy rash  . Ciprofloxacin Nausea Only  . Actos [Pioglitazone] Swelling  . Adhesive [Tape] Other (See Comments)    Causes sores  . Contrast Media [Iodinated Diagnostic Agents] Other (See  Comments)    Headache (no action/pre-med required)  . Latex Rash  . Lipitor [Atorvastatin] Other (See Comments)    myalgia    Social History   Socioeconomic History  . Marital status: Widowed  Spouse name: Not on file  . Number of children: Not on file  . Years of education: Not on file  . Highest education level: Not on file  Occupational History  . Not on file  Tobacco Use  . Smoking status: Never Smoker  . Smokeless tobacco: Never Used  Substance and Sexual Activity  . Alcohol use: No  . Drug use: No  . Sexual activity: Never    Birth control/protection: Post-menopausal  Other Topics Concern  . Not on file  Social History Narrative  . Not on file   Social Determinants of Health   Financial Resource Strain:   . Difficulty of Paying Living Expenses:   Food Insecurity:   . Worried About Charity fundraiser in the Last Year:   . Arboriculturist in the Last Year:   Transportation Needs:   . Film/video editor (Medical):   Marland Kitchen Lack of Transportation (Non-Medical):   Physical Activity:   . Days of Exercise per Week:   . Minutes of Exercise per Session:   Stress:   . Feeling of Stress :   Social Connections:   . Frequency of Communication with Friends and Family:   . Frequency of Social Gatherings with Friends and Family:   . Attends Religious Services:   . Active Member of Clubs or Organizations:   . Attends Archivist Meetings:   Marland Kitchen Marital Status:   Intimate Partner Violence:   . Fear of Current or Ex-Partner:   . Emotionally Abused:   Marland Kitchen Physically Abused:   . Sexually Abused:      ROS- All systems are reviewed and negative except as per the HPI above.  Physical Exam: Vitals:   04/08/19 1505  BP: (!) 142/78  Pulse: (!) 55  Weight: 62.6 kg  Height: 5\' 3"  (1.6 m)    GEN- The patient is well appearing elderly female, alert and oriented x 3 today.   HEENT-head normocephalic, atraumatic, sclera clear, conjunctiva pink, hearing intact,  trachea midline. Lungs- Clear to ausculation bilaterally, normal work of breathing Heart- Regular rate and rhythm, no murmurs, rubs or gallops  GI- soft, NT, ND, + BS Extremities- no clubbing, cyanosis, or edema MS- no significant deformity or atrophy Skin- no rash or lesion Psych- euthymic mood, full affect Neuro- strength and sensation are intact   Wt Readings from Last 3 Encounters:  04/08/19 62.6 kg  03/21/19 69.5 kg  03/11/19 66.4 kg    EKG today demonstrates SB HR 55, 1st degree AV block, LAFB, slow R wave prog, PR 282, QRS 122, QTc 449  Echo 01/21/19 demonstrated  1. Left ventricular ejection fraction, by visual estimation, is 40 to  45%. The left ventricle has normal function. There is no left ventricular  hypertrophy.  2. The left ventricle demonstrates global hypokinesis.  3. Global right ventricle has normal systolic function.The right  ventricular size is normal. No increase in right ventricular wall  thickness.  4. Left atrial size was mildly dilated.  5. Right atrial size was normal.  6. Mild mitral annular calcification.  7. The mitral valve is normal in structure. Mild mitral valve  regurgitation. No evidence of mitral stenosis.  8. The tricuspid valve is normal in structure.  9. The aortic valve is normal in structure. Aortic valve regurgitation is  mild. No evidence of aortic valve sclerosis or stenosis.  10. The pulmonic valve was normal in structure. Pulmonic valve  regurgitation is trivial.  11. There is mild dilatation  of the ascending aorta measuring 43 mm.  12. Moderately elevated pulmonary artery systolic pressure.  13. The tricuspid regurgitant velocity is 2.90 m/s, and with an assumed  right atrial pressure of 8 mmHg, the estimated right ventricular systolic  pressure is moderately elevated at 41.6 mmHg.  14. A pacer wire is visualized.  15. The inferior vena cava is normal in size with greater than 50%  respiratory variability, suggesting  right atrial pressure of 3 mmHg.   In comparison to the previous echocardiogram(s): 09/27/17 EF 55-60%. PA  pressure 84mmHg.  EF appears reduced when compared to prior.   Epic records are reviewed at length today  CHA2DS2-VASc Score = 5 The patient's score is based upon: CHF History: No HTN History: No Age : 60 + Diabetes History: Yes Stroke History: No Vascular Disease History: Yes Gender: Female     ASSESSMENT AND PLAN: 1. Persistent Atrial Fibrillation (ICD10:  I48.19) The patient's CHA2DS2-VASc score is 5, indicating a 7.2% annual risk of stroke.   S/p DCCV on 02/13/19 with ERAF. Would avoid class IC and Multaq given CAD and CHF. H/o torsades on sotalol.  Patient appears to be maintaining SR. Decrease amiodarone to 200 mg daily Continue Eliquis 5 mg BID Will stop BID metoprolol today given prolonged PR interval and bradycardia. She may continue PRN Lopressor if she has heart racing.  2. Secondary Hypercoagulable State (ICD10:  D68.69) The patient is at significant risk for stroke/thromboembolism based upon her CHA2DS2-VASc Score of 5.  Continue Apixaban (Eliquis).   3. Chronic combined systolic/diastolic CHF EF 44-92% on echo No signs or symptoms of fluid overload today.  4. CAD S/p PCI to LAD and LCx in 2003. Managing conservatively.  Followed by Dr Sallyanne Kuster.  5. VT S/p ICD Now on amiodarone.  Followed by Dr Sallyanne Kuster.   Follow up in the AF clinic in 3 months.    [facility] [address] [place],  [address] [phone] 04/08/2019 3:59 PM

## 2019-04-09 NOTE — Progress Notes (Signed)
Thanks, [name] 

## 2019-04-10 ENCOUNTER — Ambulatory Visit (INDEPENDENT_AMBULATORY_CARE_PROVIDER_SITE_OTHER): Payer: Medicare Other | Admitting: *Deleted

## 2019-04-10 DIAGNOSIS — Z0000 Encounter for general adult medical examination without abnormal findings: Secondary | ICD-10-CM

## 2019-04-10 NOTE — Patient Instructions (Signed)
[facility] Maintenance Summary and Written Plan of Care  [name] ,  Thank you for allowing me to perform your Medicare Annual Wellness Visit and for your ongoing commitment to your health.   Health Maintenance & Immunization History Health Maintenance  Topic Date Due  . OPHTHALMOLOGY EXAM  12/19/2015  . MAMMOGRAM  01/11/2019  . DEXA SCAN  11/18/2019 (Originally 10/19/1998)  . HEMOGLOBIN A1C  04/23/2019  . FOOT EXAM  10/23/2019  . URINE MICROALBUMIN  10/23/2019  . TETANUS/TDAP  05/08/2020  . INFLUENZA VACCINE  Completed  . PNA vac Low Risk Adult  Completed   Immunization History  Administered Date(s) Administered  . Fluad Quad(high Dose 65+) 10/23/2018  . Influenza, High Dose Seasonal PF 11/01/2015, 11/17/2016, 11/13/2017  . Influenza,inj,Quad PF,6+ Mos 11/11/2013, 10/21/2014  . Influenza-Unspecified 11/16/2012, 10/23/2016  . Pneumococcal Conjugate-13 11/18/2012  . Pneumococcal Polysaccharide-23 01/17/2003  . Tdap 05/09/2010  . Zoster 05/01/2011    These are the patient goals that we discussed: Goals Addressed            This Visit's Progress   . Exercise 3x per week (30 min per time)       04/10/2019 AWV Goal: Exercise for General Health    Patient will verbalize understanding of the benefits of increased physical activity:  Exercising regularly is important. It will improve your overall fitness, flexibility, and endurance.  Regular exercise also will improve your overall health. It can help you control your weight, reduce stress, and improve your bone density.  Over the next year, patient will increase physical activity as tolerated with a goal of at least 150 minutes of moderate physical activity per week.   You can tell that you are exercising at a moderate intensity if your heart starts beating faster and you start breathing faster but can still hold a conversation.  Moderate-intensity exercise ideas include:  Walking 1 mile  (1.6 km) in about 15 minutes  Biking  Hiking  Golfing  Dancing  Water aerobics  Patient will verbalize understanding of everyday activities that increase physical activity by providing examples like the following: ? Yard work, such as: ? Pushing a Conservation officer, nature ? Raking and bagging leaves ? Washing your car ? Pushing a stroller ? Shoveling snow ? Gardening ? Washing windows or floors  Patient will be able to explain general safety guidelines for exercising:   Before you start a new exercise program, talk with your health care provider.  Do not exercise so much that you hurt yourself, feel dizzy, or get very short of breath.  Wear comfortable clothes and wear shoes with good support.  Drink plenty of water while you exercise to prevent dehydration or heat stroke.  Work out until your breathing and your heartbeat get faster.     . Prevent falls       04/10/2019 AWV Goal: Fall Prevention  . Over the next year, patient will decrease their risk for falls by: o Using assistive devices, such as a cane or walker, as needed o Identifying fall risks within their home and correcting them by: - Removing throw rugs - Adding handrails to stairs or ramps - Removing clutter and keeping a clear pathway throughout the home - Increasing light, especially at night - Adding shower handles/bars - Raising toilet seat o Identifying potential personal risk factors for falls: - Medication side effects - Incontinence/urgency - Vestibular dysfunction - Hearing loss - Musculoskeletal disorders - Neurological disorders - Orthostatic hypotension  This is a list of Health Maintenance Items that are overdue or due now: Health Maintenance Due  Topic Date Due  . OPHTHALMOLOGY EXAM  12/19/2015  . MAMMOGRAM  01/11/2019     Orders/Referrals Placed Today: No orders of the defined types were placed in this encounter.  (Contact our referral department at [phone] if you have  not spoken with someone about your referral appointment within the next 5 days)

## 2019-04-10 NOTE — Progress Notes (Signed)
MEDICARE ANNUAL WELLNESS VISIT  04/10/2019  Telephone Visit Disclaimer This Medicare AWV was conducted by telephone due to national recommendations for restrictions regarding the COVID-19 Pandemic (e.g. social distancing).  I verified, using two identifiers, that I am speaking with [name] or their authorized healthcare agent. I discussed the limitations, risks, security, and privacy concerns of performing an evaluation and management service by telephone and the potential availability of an in-person appointment in the future. The patient expressed understanding and agreed to proceed.   Subjective:  [name] is a 86 y.o. female patient of Renee Norlander, DO who had a Medicare Annual Wellness Visit today via telephone. [name] is Retired and lives alone. she has 4 children. she reports that she is socially active and does interact with friends/family regularly. she is not physically active and enjoys reading.  Patient Care Team: Renee Norlander, DO as PCP - General (Family Medicine) Sanda Klein, MD as PCP - Cardiology (Cardiology) Zadie Rhine Clent Demark, MD as Consulting Physician (Ophthalmology) Paralee Cancel, MD as Consulting Physician (Orthopedic Surgery) Croitoru, Dani Gobble, MD as Consulting Physician (Cardiology) Irene Shipper, MD as Consulting Physician (Gastroenterology)  Advanced Directives 04/10/2019 02/21/2019 02/13/2019 01/09/2019 01/08/2019 12/28/2018 12/28/2018  Does Patient Have a Medical Advance Directive? Yes Yes Yes Yes Yes Yes Yes  Type of Paramedic of [place];Living will - [place];Living will Healthcare Power of [place]  Does patient want to make changes to medical advance directive? No - Patient declined - - No - Patient declined No - Patient declined - No - Patient declined  Copy of [place] in Chart? No - copy requested - No -  copy requested No - copy requested No - copy requested - No - copy requested  Would patient like information on creating a medical advance directive? - - - - - - -  Pre-existing out of facility DNR order (yellow form or pink MOST form) - - - - - - -    Hospital Utilization Over the Past 12 Months: # of hospitalizations or ER visits: 2 # of surgeries: 0  Review of Systems    Patient reports that her overall health is worse compared to last year.  History obtained from chart review and the patient General ROS: negative Psychological ROS: positive for - memory difficulties  Patient Reported Readings (BP, Pulse, CBG, Weight, etc) none  Pain Assessment Pain : 0-10 Pain Score: 5  Pain Type: Chronic pain Pain Location: Knee Pain Orientation: Right Pain Radiating Towards: down leg Pain Descriptors / Indicators: Constant, Aching Pain Onset: More than a month ago Pain Relieving Factors: Tylenol, Compression Stockings  Pain Relieving Factors: Tylenol, Compression Stockings  Current Medications & Allergies (verified) Allergies as of 04/10/2019      Reactions   Sotalol Other (See Comments)   Torsades   Bactrim [sulfamethoxazole-trimethoprim]    Itchy rash   Ciprofloxacin Nausea Only   Actos [pioglitazone] Swelling   Adhesive [tape] Other (See Comments)   Causes sores   Contrast Media [iodinated Diagnostic Agents] Other (See Comments)   Headache (no action/pre-med required)   Latex Rash   Lipitor [atorvastatin] Other (See Comments)   myalgia      Medication List       Accurate as of April 10, 2019  3:10 PM. If you have any questions, ask your nurse or doctor.        amiodarone  200 MG tablet Commonly known as: PACERONE Take 1 tablet (200 mg total) by mouth daily.   amLODipine 2.5 MG tablet Commonly known as: NORVASC Take 1 tablet (2.5 mg total) by mouth daily.   carboxymethylcellulose 0.5 % Soln Commonly known as: REFRESH PLUS Place 1 drop into both eyes 3 (three)  times daily as needed (dry/irritated eyes.).   CENTRUM SILVER ULTRA WOMENS PO Take 1 tablet by mouth daily.   CITRACAL + D PO Take 1 tablet by mouth every evening.   diphenoxylate-atropine 2.5-0.025 MG tablet Commonly known as: LOMOTIL TAKE 1 TABLET EVERY 8 HOURS AS NEEDED FOR DIARRHEA   Eliquis 2.5 MG Tabs tablet Generic drug: apixaban TAKE  (1)  TABLET TWICE A DAY. What changed: See the new instructions.   isosorbide mononitrate 30 MG 24 hr tablet Commonly known as: IMDUR Take 0.5 tablets (15 mg total) by mouth daily.   loratadine 10 MG tablet Commonly known as: CLARITIN Take 1 tablet (10 mg total) by mouth daily. (for itching) What changed:   when to take this  reasons to take this  additional instructions   metoprolol tartrate 25 MG tablet Commonly known as: LOPRESSOR Take 1 tablet (25 mg) daily as needed for elevated for heart rate   nitroGLYCERIN 0.4 MG SL tablet Commonly known as: NITROSTAT Place 1 tablet (0.4 mg total) under the tongue every 5 (five) minutes as needed for chest pain.   pantoprazole 40 MG tablet Commonly known as: PROTONIX Take 1 tablet (40 mg total) by mouth every evening.   PARoxetine 20 MG tablet Commonly known as: PAXIL TAKE (1) TABLET DAILY IN THE MORNING.   rosuvastatin 40 MG tablet Commonly known as: CRESTOR TAKE (1/2) TABLET DAILY. What changed: See the new instructions.   torsemide 20 MG tablet Commonly known as: DEMADEX Take 1 tablet (20 mg total) by mouth daily as needed (edema).   Vitamin D (Ergocalciferol) 1.25 MG (50000 UNIT) Caps capsule Commonly known as: DRISDOL Take 1 capsule (50,000 Units total) by mouth every 7 (seven) days.       History (reviewed): Past Medical History:  Diagnosis Date  . AICD (automatic cardioverter/defibrillator) present   . Allergy    SESONAL  . Anxiety   . ARTHRITIS   . Arthritis   . Atrial fibrillation ([place])   . CAD (coronary artery disease)    a. history of cardiac arrest  1993;  b. s/p LAD/LCX stenting in 2003;  c. 07/2011 Cath: LM nl, LAD patent stent, D1 80ost, LCX patent stent, RCA min irregs;  d. 04/2012 MV: EF 66%, no ishcemia;  e. 12/2013 Echo: Ef 55%, no rwma, Gr 1 DD, triv AI/MR, mildly dil LA;  f. 12/2013 Lexi MV: intermediate risk - apical ischemia and inf/infsept fixed defect, ? artifactual.  . CAD in native artery 12/31/2013   Previous stents to LAD and Ramus patent on cath today 12/31/13 also There is severe disease in the ostial first diagonal which is unchanged from most recent cardiac catheterization. The right coronary artery could not be engaged selectively but nonselective angiography showed no significant disease in the proximal and midsegment.     . Cataract    DENIES  . Chronic back pain   . Chronic sinus bradycardia   . CKD (chronic kidney disease), stage III   . Clotting disorder (HCC)    DVT  . COLITIS 12/02/2007   Qualifier: Diagnosis of  By: Nils Pyle CMA ([place]), [name]    . Diabetes mellitus without complication ([place])  DENIES  . Diverticulosis of colon (without mention of hemorrhage) 2007   Colonoscopy   . Esophageal stricture    a. 2012 s/p dil.  . Esophagitis, unspecified    a. 2012 EGD  . EXTERNAL HEMORRHOIDS   . GERD (gastroesophageal reflux disease)    omeprazole  . H/O hiatal hernia   . Hiatal hernia    a. 2012 EGD.  Marland Kitchen History of DVT in the past, not on Coumadin now    left  leg  . HYPERCHOLESTEROLEMIA   . Hypertension   . ICD (implantable cardiac defibrillator) in place    a. s/p initial ICD in 1993 in setting of cardiac arrest;  b. 01/2007 gen change: Guidant T135 Vitality DS VR single lead ICD.  . Macular degeneration    gets injecton in eye every 5 weeks- last injection - 05/03/2013   . Myocardial infarction ([place]) 1993  . Near syncope 05/22/2015  . Nephrolithiasis, just saw Dr [name]   . Orthostatic hypotension   . Peripheral vascular disease ([place])    ???  . RECTAL BLEEDING 12/03/2007   Qualifier: Diagnosis  of  By: Sharlett Iles MD Byrd Hesselbach   . Unspecified gastritis and gastroduodenitis without mention of hemorrhage    a. 2003 EGD->not noted on 2012 EGD.  Marland Kitchen Unstable angina, neg MI, cath stable.maybe GI 12/30/2013   Past Surgical History:  Procedure Laterality Date  . BREAST BIOPSY Left 11/2013  . CARDIAC CATHETERIZATION    . CARDIAC CATHETERIZATION  01/01/15   patent stents -there is severe disease in ostial 1st diag but without change.   Marland Kitchen CARDIAC DEFIBRILLATOR PLACEMENT  02/05/2007   Guidant  . CARDIOVASCULAR STRESS TEST  12/30/2013   abnormal  . CARDIOVERSION N/A 02/13/2019   Procedure: CARDIOVERSION;  Surgeon: Sanda Klein, MD;  Location: [facility];  Service: Cardiovascular;  Laterality: N/A;  . COLONOSCOPY    . coronary stents     . CYSTOSCOPY WITH URETEROSCOPY Right 05/20/2013   Procedure: CYSTOSCOPY, RIGHT URETEROSCOPY STONE EXTRACTION, Insertion of right DOUBLE J STENT ;  Surgeon: Irine Seal, MD;  Location: [facility];  Service: Urology;  Laterality: Right;  . CYSTOSCOPY WITH URETEROSCOPY AND STENT PLACEMENT N/A 06/03/2013   Procedure: SECOND LOOK CYSTOSCOPY WITH URETEROSCOPY  HOLMIUM LASER LITHO AND STONE EXTRACTION Sammie Bench ;  Surgeon: Malka So, MD;  Location: [facility];  Service: Urology;  Laterality: N/A;  . GIVENS CAPSULE STUDY N/A 10/30/2016   Procedure: GIVENS CAPSULE STUDY;  Surgeon: Irene Shipper, MD;  Location: [facility];  Service: Endoscopy;  Laterality: N/A;  NEEDS TO BE ADMITTED FOR OBSERVATION PT. HAS DEFIB  . HEMORROIDECTOMY  80's  . HOLMIUM LASER APPLICATION Right 04/16/6604   Procedure: HOLMIUM LASER APPLICATION;  Surgeon: Irine Seal, MD;  Location: [facility];  Service: Urology;  Laterality: Right;  . HYSTEROSCOPY WITH D & C  09/02/2010   Procedure: DILATATION AND CURETTAGE (D&C) /HYSTEROSCOPY;  Surgeon: Margarette Asal;  Location: [facility];  Service: Gynecology;  Laterality: N/A;  Dilation and Curettage with Hysteroscopy and Polypectomy  . IMPLANTABLE CARDIOVERTER  DEFIBRILLATOR (ICD) GENERATOR CHANGE N/A 02/10/2014   Procedure: ICD GENERATOR CHANGE;  Surgeon: Sanda Klein, MD;  Location: [facility];  Service: Cardiovascular;  Laterality: N/A;  . LEFT HEART CATHETERIZATION WITH CORONARY ANGIOGRAM N/A 07/28/2011   Procedure: LEFT HEART CATHETERIZATION WITH CORONARY ANGIOGRAM;  Surgeon: Lorretta Harp, MD;  [facility];  Service: Cardiovascular;  Laterality: N/A;  . LEFT HEART CATHETERIZATION WITH CORONARY ANGIOGRAM  N/A 12/31/2013   Procedure: LEFT HEART CATHETERIZATION WITH CORONARY ANGIOGRAM;  Surgeon: Wellington Hampshire, MD;  Location: [facility] CATH [facility];  Service: Cardiovascular;  Laterality: N/A;   Family History  Problem Relation Age of Onset  . Stroke Mother   . Other Mother        brain tumor  . Other Father        MI  . Heart attack Father   . Stroke Sister   . Macular degeneration Sister   . Diabetes Daughter   . Cancer Daughter        ovarian  . Colon polyps Daughter   . Atrial fibrillation Sister   . Hyperlipidemia Sister   . Osteoporosis Sister   . Stroke Sister   . Uterine cancer Sister   . Heart attack Brother   . Heart disease Brother   . Asthma Brother   . Breast cancer Neg Hx    Social History   Socioeconomic History  . Marital status: Widowed    Spouse name: Not on file  . Number of children: 4  . Years of education: 8  . Highest education level: 8th grade  Occupational History  . Occupation: Retired  Tobacco Use  . Smoking status: Never Smoker  . Smokeless tobacco: Never Used  Substance and Sexual Activity  . Alcohol use: No  . Drug use: No  . Sexual activity: Never    Birth control/protection: Post-menopausal  Other Topics Concern  . Not on file  Social History Narrative  . Not on file   Social Determinants of Health   Financial Resource Strain: Low Risk   . Difficulty of Paying Living Expenses: Not hard at all  Food Insecurity: No Food Insecurity  . Worried About Charity fundraiser in the Last  Year: Never true  . Ran Out of Food in the Last Year: Never true  Transportation Needs: No Transportation Needs  . Lack of Transportation (Medical): No  . Lack of Transportation (Non-Medical): No  Physical Activity: Inactive  . Days of Exercise per Week: 0 days  . Minutes of Exercise per Session: 0 min  Stress: No Stress Concern Present  . Feeling of Stress : Only a little  Social Connections: Slightly Isolated  . Frequency of Communication with Friends and Family: More than three times a week  . Frequency of Social Gatherings with Friends and Family: Once a week  . Attends Religious Services: More than 4 times per year  . Active Member of Clubs or Organizations: Yes  . Attends Archivist Meetings: More than 4 times per year  . Marital Status: Widowed    Activities of Daily Living In your present state of health, do you have any difficulty performing the following activities: 04/10/2019 01/09/2019  Hearing? N N  Vision? Y N  Difficulty concentrating or making decisions? Y N  Walking or climbing stairs? Y N  Dressing or bathing? N N  Doing errands, shopping? Tempie Donning  Preparing Food and eating ? Y -  Using the Toilet? N -  In the past six months, have you accidently leaked urine? Y -  Do you have problems with loss of bowel control? N -  Managing your Medications? N -  Managing your Finances? N -  Housekeeping or managing your Housekeeping? Y -  Some recent data might be hidden    Patient Education/ Literacy How often do you need to have someone help you when you read instructions, pamphlets, or other written  materials from your doctor or pharmacy?: 3 - Sometimes(Macular Degeneration) What is the last grade level you completed in school?: 8  Exercise Current Exercise Habits: The patient does not participate in regular exercise at present, Exercise limited by: orthopedic condition(s);psychological condition(s)  Diet Patient reports consuming 2 meals a day and 1 snack(s)  a day Patient reports that her primary diet is: Regular Patient reports that she does have regular access to food.   Depression Screen PHQ 2/9 Scores 04/10/2019 02/26/2019 01/20/2019 10/23/2018 07/12/2018 02/12/2018 01/30/2018  PHQ - 2 Score 0 2 2 0 0 0 0  PHQ- 9 Score 0 3 8 0 0 0 -     Fall Risk Fall Risk  04/10/2019 02/26/2019 01/20/2019 10/23/2018 07/12/2018  Falls in the past year? 0 0 0 0 0  Number falls in past yr: 0 - - - -  Injury with Fall? 0 - - - -  Risk for fall due to : History of fall(s);Impaired balance/gait;Impaired mobility;Impaired vision - - - -  Follow up Falls evaluation completed - - - -     Objective:  [name] seemed alert and oriented and she participated appropriately during our telephone visit.  Blood Pressure Weight BMI  BP Readings from Last 3 Encounters:  04/08/19 (!) 142/78  03/21/19 (!) 158/79  03/11/19 118/74   Wt Readings from Last 3 Encounters:  04/08/19 138 lb (62.6 kg)  03/21/19 153 lb 3.2 oz (69.5 kg)  03/11/19 146 lb 6.4 oz (66.4 kg)   BMI Readings from Last 1 Encounters:  04/08/19 24.45 kg/m    *Unable to obtain current vital signs, weight, and BMI due to telephone visit type  Hearing/Vision  . Taliyah did not seem to have difficulty with hearing/understanding during the telephone conversation . Reports that she has not had a formal eye exam by an eye care professional within the past year . Reports that she has not had a formal hearing evaluation within the past year *Unable to fully assess hearing and vision during telephone visit type  Cognitive Function: 6CIT Screen 04/10/2019  What Year? 4 points  What month? 0 points  What time? 0 points  Count back from 20 0 points  Months in reverse 2 points  Repeat phrase 4 points  Total Score 10   (Normal:0-7, Significant for Dysfunction: >8)  Normal Cognitive Function Screening: No: Score of 10 on 6CIT.  Patient had difficulty recalling address and the year.  Patient states that she  thinks this is coming from her new meds (Pacerone) that cardiologist prescribed.     Immunization & Health Maintenance Record Immunization History  Administered Date(s) Administered  . Fluad Quad(high Dose 65+) 10/23/2018  . Influenza, High Dose Seasonal PF 11/01/2015, 11/17/2016, 11/13/2017  . Influenza,inj,Quad PF,6+ Mos 11/11/2013, 10/21/2014  . Influenza-Unspecified 11/16/2012, 10/23/2016  . Pneumococcal Conjugate-13 11/18/2012  . Pneumococcal Polysaccharide-23 01/17/2003  . Tdap 05/09/2010  . Zoster 05/01/2011    Health Maintenance  Topic Date Due  . OPHTHALMOLOGY EXAM  12/19/2015  . MAMMOGRAM  01/11/2019  . DEXA SCAN  11/18/2019 (Originally 10/19/1998)  . HEMOGLOBIN A1C  04/23/2019  . FOOT EXAM  10/23/2019  . URINE MICROALBUMIN  10/23/2019  . TETANUS/TDAP  05/08/2020  . INFLUENZA VACCINE  Completed  . PNA vac Low Risk Adult  Completed       Assessment  This is a routine wellness examination for [name].  Health Maintenance: Due or Overdue Health Maintenance Due  Topic Date Due  . OPHTHALMOLOGY  EXAM  12/19/2015  . MAMMOGRAM  01/11/2019    [name] does not need a referral for Community Assistance: Care Management:   no Social Work:    no Prescription Assistance:  no Nutrition/Diabetes Education:  no   Plan:  Personalized Goals Goals Addressed            This Visit's Progress   . Exercise 3x per week (30 min per time)       04/10/2019 AWV Goal: Exercise for General Health    Patient will verbalize understanding of the benefits of increased physical activity:  Exercising regularly is important. It will improve your overall fitness, flexibility, and endurance.  Regular exercise also will improve your overall health. It can help you control your weight, reduce stress, and improve your bone density.  Over the next year, patient will increase physical activity as tolerated with a goal of at least 150 minutes of moderate physical activity  per week.   You can tell that you are exercising at a moderate intensity if your heart starts beating faster and you start breathing faster but can still hold a conversation.  Moderate-intensity exercise ideas include:  Walking 1 mile (1.6 km) in about 15 minutes  Biking  Hiking  Golfing  Dancing  Water aerobics  Patient will verbalize understanding of everyday activities that increase physical activity by providing examples like the following: ? Yard work, such as: ? Pushing a Conservation officer, nature ? Raking and bagging leaves ? Washing your car ? Pushing a stroller ? Shoveling snow ? Gardening ? Washing windows or floors  Patient will be able to explain general safety guidelines for exercising:   Before you start a new exercise program, talk with your health care provider.  Do not exercise so much that you hurt yourself, feel dizzy, or get very short of breath.  Wear comfortable clothes and wear shoes with good support.  Drink plenty of water while you exercise to prevent dehydration or heat stroke.  Work out until your breathing and your heartbeat get faster.     . Prevent falls       04/10/2019 AWV Goal: Fall Prevention  . Over the next year, patient will decrease their risk for falls by: o Using assistive devices, such as a cane or walker, as needed o Identifying fall risks within their home and correcting them by: - Removing throw rugs - Adding handrails to stairs or ramps - Removing clutter and keeping a clear pathway throughout the home - Increasing light, especially at night - Adding shower handles/bars - Raising toilet seat o Identifying potential personal risk factors for falls: - Medication side effects - Incontinence/urgency - Vestibular dysfunction - Hearing loss - Musculoskeletal disorders - Neurological disorders - Orthostatic hypotension        Personalized Health Maintenance & Screening Recommendations  Screening electrocardiogram Screening  mammography Bone densitometry screening Glaucoma screening  Lung Cancer Screening Recommended: no (Low Dose CT Chest recommended if Age 63-80 years, 30 pack-year currently smoking OR have quit w/in past 15 years) Hepatitis C Screening recommended: no HIV Screening recommended: no  Advanced Directives: Written information was not prepared per patient's request.  Referrals & Orders No orders of the defined types were placed in this encounter.   Follow-up Plan . Follow-up with Renee Norlander, DO as planned    I have personally reviewed and noted the following in the patient's chart:   . Medical and social history . Use of alcohol, tobacco or illicit drugs  .  Current medications and supplements . Functional ability and status . Nutritional status . Physical activity . Advanced directives . List of other physicians . Hospitalizations, surgeries, and ER visits in previous 12 months . Vitals . Screenings to include cognitive, depression, and falls . Referrals and appointments  In addition, I have reviewed and discussed with [name] certain preventive protocols, quality metrics, and best practice recommendations. A written personalized care plan for preventive services as well as general preventive health recommendations is available and can be mailed to the patient at her request.      Wardell Heath, LPN  [id number]

## 2019-04-17 ENCOUNTER — Other Ambulatory Visit: Payer: Self-pay | Admitting: Family Medicine

## 2019-04-17 DIAGNOSIS — E559 Vitamin D deficiency, unspecified: Secondary | ICD-10-CM

## 2019-04-23 ENCOUNTER — Ambulatory Visit (INDEPENDENT_AMBULATORY_CARE_PROVIDER_SITE_OTHER): Payer: Medicare Other | Admitting: *Deleted

## 2019-04-23 DIAGNOSIS — I4729 Other ventricular tachycardia: Secondary | ICD-10-CM

## 2019-04-23 DIAGNOSIS — I472 Ventricular tachycardia: Secondary | ICD-10-CM

## 2019-04-23 LAB — CUP PACEART REMOTE DEVICE CHECK
Battery Remaining Longevity: 144 mo
Battery Remaining Percentage: 100 %
Brady Statistic RV Percent Paced: 1 %
Date Time Interrogation Session: 20210407005500
HighPow Impedance: 41 Ohm
Implantable Lead Implant Date: 20090120
Implantable Lead Location: 753860
Implantable Lead Model: 157
Implantable Lead Serial Number: 138237
Implantable Pulse Generator Implant Date: 20160126
Lead Channel Impedance Value: 594 Ohm
Lead Channel Pacing Threshold Amplitude: 0.8 V
Lead Channel Pacing Threshold Pulse Width: 0.5 ms
Lead Channel Setting Pacing Amplitude: 2.2 V
Lead Channel Setting Pacing Pulse Width: 0.5 ms
Lead Channel Setting Sensing Sensitivity: 0.6 mV
Pulse Gen Serial Number: 191956

## 2019-04-23 NOTE — Progress Notes (Signed)
ICD Remote  

## 2019-04-30 ENCOUNTER — Ambulatory Visit (INDEPENDENT_AMBULATORY_CARE_PROVIDER_SITE_OTHER): Payer: Medicare Other | Admitting: Ophthalmology

## 2019-04-30 ENCOUNTER — Other Ambulatory Visit: Payer: Self-pay

## 2019-04-30 ENCOUNTER — Encounter (INDEPENDENT_AMBULATORY_CARE_PROVIDER_SITE_OTHER): Payer: Self-pay | Admitting: Ophthalmology

## 2019-04-30 DIAGNOSIS — H3561 Retinal hemorrhage, right eye: Secondary | ICD-10-CM | POA: Insufficient documentation

## 2019-04-30 DIAGNOSIS — H353124 Nonexudative age-related macular degeneration, left eye, advanced atrophic with subfoveal involvement: Secondary | ICD-10-CM | POA: Insufficient documentation

## 2019-04-30 DIAGNOSIS — H353114 Nonexudative age-related macular degeneration, right eye, advanced atrophic with subfoveal involvement: Secondary | ICD-10-CM | POA: Insufficient documentation

## 2019-04-30 DIAGNOSIS — H353222 Exudative age-related macular degeneration, left eye, with inactive choroidal neovascularization: Secondary | ICD-10-CM | POA: Diagnosis not present

## 2019-04-30 DIAGNOSIS — H353113 Nonexudative age-related macular degeneration, right eye, advanced atrophic without subfoveal involvement: Secondary | ICD-10-CM | POA: Insufficient documentation

## 2019-04-30 DIAGNOSIS — H353211 Exudative age-related macular degeneration, right eye, with active choroidal neovascularization: Secondary | ICD-10-CM | POA: Insufficient documentation

## 2019-04-30 DIAGNOSIS — H353212 Exudative age-related macular degeneration, right eye, with inactive choroidal neovascularization: Secondary | ICD-10-CM | POA: Insufficient documentation

## 2019-04-30 DIAGNOSIS — H43813 Vitreous degeneration, bilateral: Secondary | ICD-10-CM

## 2019-04-30 MED ORDER — BEVACIZUMAB CHEMO INJECTION 1.25MG/0.05ML SYRINGE FOR KALEIDOSCOPE
1.2500 mg | INTRAVITREAL | Status: AC | PRN
  Administered 2019-04-30: 09:00:00 1.25 mg via INTRAVITREAL

## 2019-04-30 NOTE — Patient Instructions (Signed)
The nature of wet macular degeneration was discussed with the patient.  Forms of therapy reviewed include the use of Anti-VEGF medications injected painlessly into the eye, as well as other possible treatment modalities, including thermal laser therapy. Fellow eye involvement and risks were discussed with the patient. Upon the finding of wet age related macular degeneration, treatment will be offered. The treatment regimen is on a treat as needed basis with the intent to treat if necessary and extend interval of exams when possible. On average 1 out of 6 patients do not need lifetime therapy. However, the risk of recurrent disease is high for a lifetime.  Initially monthly, then periodic, examinations and evaluations will determine whether the next treatment is required on the day of the examination.  The nature of vitreous floaters was discussed with the patient as well as their frequent occurrence with development of posterior vitreous detachment. The significance of flashes and field cuts was discussed with the patient. The need for a dilated fundus examination was discussed and advice given to return immediately for new or different floaters .  More uncommonly, vitreous floaters, debris, or clouds of haze may develop with impact on visual functioning.  If the personal impact to the patient is minor, observation usually results in diminishing symptoms.  If visual impact hampers activity of daily living over a period of time, medical interventions are available to change the condition, including laser vitreolysis or more commonly, surgical intervention to remove the visual impactful floaters.

## 2019-04-30 NOTE — Progress Notes (Signed)
04/30/2019     CHIEF COMPLAINT Patient presents for Macular Degeneration and Retina Follow Up   HISTORY OF PRESENT ILLNESS: [name] is a 86 y.o. female who presents to the clinic today for:   HPI    Retina Follow Up    Patient presents with  Wet AMD.  In right eye.  Severity is moderate.  Since onset it is gradually worsening.  I, the attending physician,  performed the HPI with the patient and updated documentation appropriately.          Comments    4 Week AMD f\u OD. Possible Avastin OD. OCT.  Pt can tell OD vision has gradually declined. Pt can no longer read subtitles on TV. Pt states on Saturday she saw a spot in OS vision, describes it as a "pearl". Pt states she is on a new heart medication.       Last edited by Hurman Horn, MD on 04/30/2019  9:22 AM. (History)      Referring physician: [name], DO [address],  [place] [id number]  HISTORICAL INFORMATION:   Selected notes from the MEDICAL RECORD NUMBER    Lab Results  Component Value Date   HGBA1C 7.0 (H) 10/23/2018     CURRENT MEDICATIONS: Current Outpatient Medications (Ophthalmic Drugs)  Medication Sig  . carboxymethylcellulose (REFRESH PLUS) 0.5 % SOLN Place 1 drop into both eyes 3 (three) times daily as needed (dry/irritated eyes.).   No current facility-administered medications for this visit. (Ophthalmic Drugs)   Current Outpatient Medications (Other)  Medication Sig  . amiodarone (PACERONE) 200 MG tablet Take 1 tablet (200 mg total) by mouth daily.  Marland Kitchen amLODipine (NORVASC) 2.5 MG tablet Take 1 tablet (2.5 mg total) by mouth daily.  . Calcium Citrate-Vitamin D (CITRACAL + D PO) Take 1 tablet by mouth every evening.   . diphenoxylate-atropine (LOMOTIL) 2.5-0.025 MG tablet TAKE 1 TABLET EVERY 8 HOURS AS NEEDED FOR DIARRHEA  . ELIQUIS 2.5 MG TABS tablet TAKE  (1)  TABLET TWICE A DAY. (Patient taking differently: Take 2.5 mg by mouth 2 (two) times daily. )  . isosorbide  mononitrate (IMDUR) 30 MG 24 hr tablet Take 0.5 tablets (15 mg total) by mouth daily.  Marland Kitchen loratadine (CLARITIN) 10 MG tablet Take 1 tablet (10 mg total) by mouth daily. (for itching) (Patient taking differently: Take 10 mg by mouth daily as needed ((for itching)). )  . metoprolol tartrate (LOPRESSOR) 25 MG tablet Take 1 tablet (25 mg) daily as needed for elevated for heart rate  . Multiple Vitamins-Minerals (CENTRUM SILVER ULTRA WOMENS PO) Take 1 tablet by mouth daily.  . nitroGLYCERIN (NITROSTAT) 0.4 MG SL tablet Place 1 tablet (0.4 mg total) under the tongue every 5 (five) minutes as needed for chest pain.  . pantoprazole (PROTONIX) 40 MG tablet Take 1 tablet (40 mg total) by mouth every evening.  Marland Kitchen PARoxetine (PAXIL) 20 MG tablet TAKE (1) TABLET DAILY IN THE MORNING.  . rosuvastatin (CRESTOR) 40 MG tablet TAKE (1/2) TABLET DAILY.  Marland Kitchen torsemide (DEMADEX) 20 MG tablet Take 1 tablet (20 mg total) by mouth daily as needed (edema).  . Vitamin D, Ergocalciferol, (DRISDOL) 1.25 MG (50000 UNIT) CAPS capsule TAKE 1 CAPSULE ONCE EVERY 7 DAYS   No current facility-administered medications for this visit. (Other)      REVIEW OF SYSTEMS:    ALLERGIES Allergies  Allergen Reactions  . Sotalol Other (See Comments)    Torsades   .  Bactrim [Sulfamethoxazole-Trimethoprim]     Itchy rash  . Ciprofloxacin Nausea Only  . Actos [Pioglitazone] Swelling  . Adhesive [Tape] Other (See Comments)    Causes sores  . Contrast Media [Iodinated Diagnostic Agents] Other (See Comments)    Headache (no action/pre-med required)  . Latex Rash  . Lipitor [Atorvastatin] Other (See Comments)    myalgia    PAST MEDICAL HISTORY Past Medical History:  Diagnosis Date  . AICD (automatic cardioverter/defibrillator) present   . Allergy    SESONAL  . Anxiety   . ARTHRITIS   . Arthritis   . Atrial fibrillation ([place])   . CAD (coronary artery disease)    a. history of cardiac arrest 1993;  b. s/p LAD/LCX stenting in  2003;  c. 07/2011 Cath: LM nl, LAD patent stent, D1 80ost, LCX patent stent, RCA min irregs;  d. 04/2012 MV: EF 66%, no ishcemia;  e. 12/2013 Echo: Ef 55%, no rwma, Gr 1 DD, triv AI/MR, mildly dil LA;  f. 12/2013 Lexi MV: intermediate risk - apical ischemia and inf/infsept fixed defect, ? artifactual.  . CAD in native artery 12/31/2013   Previous stents to LAD and Ramus patent on cath today 12/31/13 also There is severe disease in the ostial first diagonal which is unchanged from most recent cardiac catheterization. The right coronary artery could not be engaged selectively but nonselective angiography showed no significant disease in the proximal and midsegment.     . Cataract    DENIES  . Chronic back pain   . Chronic sinus bradycardia   . CKD (chronic kidney disease), stage III   . Clotting disorder (HCC)    DVT  . COLITIS 12/02/2007   Qualifier: Diagnosis of  By: Nils Pyle CMA ([place]), [name]    . Diabetes mellitus without complication ([place])    DENIES  . Diverticulosis of colon (without mention of hemorrhage) 2007   Colonoscopy   . Esophageal stricture    a. 2012 s/p dil.  . Esophagitis, unspecified    a. 2012 EGD  . EXTERNAL HEMORRHOIDS   . GERD (gastroesophageal reflux disease)    omeprazole  . H/O hiatal hernia   . Hiatal hernia    a. 2012 EGD.  Marland Kitchen History of DVT in the past, not on Coumadin now    left  leg  . HYPERCHOLESTEROLEMIA   . Hypertension   . ICD (implantable cardiac defibrillator) in place    a. s/p initial ICD in 1993 in setting of cardiac arrest;  b. 01/2007 gen change: Guidant T135 Vitality DS VR single lead ICD.  . Macular degeneration    gets injecton in eye every 5 weeks- last injection - 05/03/2013   . Myocardial infarction ([place]) 1993  . Near syncope 05/22/2015  . Nephrolithiasis, just saw Dr [name]   . Orthostatic hypotension   . Peripheral vascular disease ([place])    ???  . RECTAL BLEEDING 12/03/2007   Qualifier: Diagnosis of  By: Sharlett Iles MD Byrd Hesselbach   . Unspecified gastritis and gastroduodenitis without mention of hemorrhage    a. 2003 EGD->not noted on 2012 EGD.  Marland Kitchen Unstable angina, neg MI, cath stable.maybe GI 12/30/2013   Past Surgical History:  Procedure Laterality Date  . BREAST BIOPSY Left 11/2013  . CARDIAC CATHETERIZATION    . CARDIAC CATHETERIZATION  01/01/15   patent stents -there is severe disease in ostial 1st diag but without change.   Marland Kitchen CARDIAC DEFIBRILLATOR PLACEMENT  02/05/2007   Guidant  . CARDIOVASCULAR STRESS  TEST  12/30/2013   abnormal  . CARDIOVERSION N/A 02/13/2019   Procedure: CARDIOVERSION;  Surgeon: Sanda Klein, MD;  Location: [facility];  Service: Cardiovascular;  Laterality: N/A;  . CATARACT EXTRACTION Right    Dr. Katy Fitch  . COLONOSCOPY    . coronary stents     . CYSTOSCOPY WITH URETEROSCOPY Right 05/20/2013   Procedure: CYSTOSCOPY, RIGHT URETEROSCOPY STONE EXTRACTION, Insertion of right DOUBLE J STENT ;  Surgeon: Irine Seal, MD;  Location: [facility];  Service: Urology;  Laterality: Right;  . CYSTOSCOPY WITH URETEROSCOPY AND STENT PLACEMENT N/A 06/03/2013   Procedure: SECOND LOOK CYSTOSCOPY WITH URETEROSCOPY  HOLMIUM LASER LITHO AND STONE EXTRACTION Sammie Bench ;  Surgeon: Malka So, MD;  Location: [facility];  Service: Urology;  Laterality: N/A;  . GIVENS CAPSULE STUDY N/A 10/30/2016   Procedure: GIVENS CAPSULE STUDY;  Surgeon: Irene Shipper, MD;  Location: [facility];  Service: Endoscopy;  Laterality: N/A;  NEEDS TO BE ADMITTED FOR OBSERVATION PT. HAS DEFIB  . HEMORROIDECTOMY  80's  . HOLMIUM LASER APPLICATION Right 02/24/9369   Procedure: HOLMIUM LASER APPLICATION;  Surgeon: Irine Seal, MD;  Location: [facility];  Service: Urology;  Laterality: Right;  . HYSTEROSCOPY WITH D & C  09/02/2010   Procedure: DILATATION AND CURETTAGE (D&C) /HYSTEROSCOPY;  Surgeon: Margarette Asal;  Location: [facility];  Service: Gynecology;  Laterality: N/A;  Dilation and Curettage with Hysteroscopy and Polypectomy  . IMPLANTABLE  CARDIOVERTER DEFIBRILLATOR (ICD) GENERATOR CHANGE N/A 02/10/2014   Procedure: ICD GENERATOR CHANGE;  Surgeon: Sanda Klein, MD;  Location: [facility];  Service: Cardiovascular;  Laterality: N/A;  . LEFT HEART CATHETERIZATION WITH CORONARY ANGIOGRAM N/A 07/28/2011   Procedure: LEFT HEART CATHETERIZATION WITH CORONARY ANGIOGRAM;  Surgeon: Lorretta Harp, MD;  Location: [facility];  Service: Cardiovascular;  Laterality: N/A;  . LEFT HEART CATHETERIZATION WITH CORONARY ANGIOGRAM N/A 12/31/2013   Procedure: LEFT HEART CATHETERIZATION WITH CORONARY ANGIOGRAM;  Surgeon: Wellington Hampshire, MD;  Location: [facility];  Service: Cardiovascular;  Laterality: N/A;    FAMILY HISTORY Family History  Problem Relation Age of Onset  . Stroke Mother   . Other Mother        brain tumor  . Other Father        MI  . Heart attack Father   . Stroke Sister   . Macular degeneration Sister   . Diabetes Daughter   . Cancer Daughter        ovarian  . Colon polyps Daughter   . Atrial fibrillation Sister   . Hyperlipidemia Sister   . Osteoporosis Sister   . Stroke Sister   . Uterine cancer Sister   . Heart attack Brother   . Heart disease Brother   . Asthma Brother   . Breast cancer Neg Hx     SOCIAL HISTORY Social History   Tobacco Use  . Smoking status: Never Smoker  . Smokeless tobacco: Never Used  Substance Use Topics  . Alcohol use: No  . Drug use: No         OPHTHALMIC EXAM:  Base Eye Exam    Visual Acuity (Snellen - Linear)      Right Left   Dist [place] 20/400 + E Card @ 4'   Dist ph [place] 20/50 -1 NI       Tonometry (Tonopen, 8:34 AM)      Right Left   Pressure 13 12       Pupils  Pupils Dark Light Shape React APD   Right PERRL 3 2 Round Minimal None   Left PERRL 3 3 Round Minimal None       Visual Fields (Counting fingers)      Left Right     Full   Restrictions Total superior temporal deficiency        Neuro/Psych    Oriented x3: Yes   Mood/Affect: Normal        Dilation    Right eye: 1.0% Mydriacyl, 2.5% Phenylephrine @ 8:34 AM        Slit Lamp and Fundus Exam    External Exam      Right Left   External Normal Normal       Slit Lamp Exam      Right Left   Lids/Lashes Normal Normal   Conjunctiva/Sclera White and quiet White and quiet   Cornea Clear Clear   Anterior Chamber Deep and quiet Deep and quiet   Iris Round and reactive Round and reactive   Lens Posterior chamber intraocular lens Posterior chamber intraocular lens   Anterior Vitreous Normal Normal       Fundus Exam      Right Left   Posterior Vitreous Central vitreous floaters, Posterior vitreous detachment    Disc Normal    C/D Ratio 0.15    Macula Atrophy, Mottling, Retinal pigment epithelial mottling with subretinal red telangiectasia and/or CVN in the region of atrophy    Vessels Normal    Periphery Normal           IMAGING AND PROCEDURES  Imaging and Procedures for @TODAY @  OCT, Retina - OU - Both Eyes       Right Eye Quality was good. Scan locations included subfoveal. Central Foveal Thickness: 224. Progression has improved. Findings include retinal drusen , abnormal foveal contour, outer retinal atrophy, no IRF, no SRF.   Left Eye Quality was good. Scan locations included subfoveal. Central Foveal Thickness: 299. Progression has improved. Findings include normal foveal contour, outer retinal atrophy, inner retinal atrophy, retinal drusen .   Notes OD, improved findings from parafoveal CN VM.  Much less intraretinal edema after recent Avastin 1 month previous.  OS stable       Intravitreal Injection, Pharmacologic Agent - OD - Right Eye       Time Out 04/30/2019. 9:27 AM. Confirmed correct patient, procedure, site, and patient consented.   Anesthesia Topical anesthesia was used. Anesthetic medications included Akten 3.5%.   Procedure Preparation included 10% betadine to eyelids, 5% betadine to ocular surface, Tobramycin 0.3%. A 30 gauge needle  was used.   Injection:  1.25 mg Bevacizumab (AVASTIN) SOLN   NDC: [address]-0, Lot: [id number]   Route: Intravitreal, Site: Right Eye, Waste: 0 mg  Post-op Post injection exam found visual acuity of at least counting fingers. The patient tolerated the procedure well. There were no complications. The patient received written and verbal post procedure care education. Post injection medications were not given.                 ASSESSMENT/PLAN:  No problem-specific Assessment & Plan notes found for this encounter.      ICD-10-CM   1. Exudative age-related macular degeneration of right eye with active choroidal neovascularization (HCC)  H35.3211 OCT, Retina - OU - Both Eyes    Intravitreal Injection, Pharmacologic Agent - OD - Right Eye    Bevacizumab (AVASTIN) SOLN 1.25 mg  2. Retinal hemorrhage of right eye  H35.61 OCT, Retina -  OU - Both Eyes  3. Advanced nonexudative age-related macular degeneration of left eye with subfoveal involvement  H35.3124   4. Exudative age-related macular degeneration of left eye with inactive choroidal neovascularization (HCC)  H35.3222 OCT, Retina - OU - Both Eyes  5. Advanced nonexudative age-related macular degeneration of right eye without subfoveal involvement  H35.3113     1.  Repeat Avastin OD today.  2.  3.  Ophthalmic Meds Ordered this visit:  Meds ordered this encounter  Medications  . Bevacizumab (AVASTIN) SOLN 1.25 mg       No follow-ups on file.  There are no Patient Instructions on file for this visit.   Explained the diagnoses, plan, and follow up with the patient and they expressed understanding.  Patient expressed understanding of the importance of proper follow up care.   Clent Demark Zaylan Kissoon M.D. Diseases & Surgery of the Retina and Vitreous Retina & Diabetic [place] @TODAY @     Abbreviations: M myopia (nearsighted); A astigmatism; H hyperopia (farsighted); P presbyopia; Mrx spectacle prescription;  CTL contact  lenses; OD right eye; OS left eye; OU both eyes  XT exotropia; ET esotropia; PEK punctate epithelial keratitis; PEE punctate epithelial erosions; DES dry eye syndrome; MGD meibomian gland dysfunction; ATs artificial tears; PFAT's preservative free artificial tears; [place] nuclear sclerotic cataract; PSC posterior subcapsular cataract; ERM epi-retinal membrane; PVD posterior vitreous detachment; RD retinal detachment; DM diabetes mellitus; DR diabetic retinopathy; NPDR non-proliferative diabetic retinopathy; PDR proliferative diabetic retinopathy; CSME clinically significant macular edema; DME diabetic macular edema; dbh dot blot hemorrhages; CWS cotton wool spot; POAG primary open angle glaucoma; C/D cup-to-disc ratio; HVF humphrey visual field; GVF goldmann visual field; OCT optical coherence tomography; IOP intraocular pressure; BRVO Branch retinal vein occlusion; CRVO central retinal vein occlusion; CRAO central retinal artery occlusion; BRAO branch retinal artery occlusion; RT retinal tear; SB scleral buckle; PPV pars plana vitrectomy; VH Vitreous hemorrhage; PRP panretinal laser photocoagulation; IVK intravitreal kenalog; VMT vitreomacular traction; MH Macular hole;  NVD neovascularization of the disc; NVE neovascularization elsewhere; AREDS age related eye disease study; ARMD age related macular degeneration; POAG primary open angle glaucoma; EBMD epithelial/anterior basement membrane dystrophy; ACIOL anterior chamber intraocular lens; IOL intraocular lens; PCIOL posterior chamber intraocular lens; Phaco/IOL phacoemulsification with intraocular lens placement; [place] photorefractive keratectomy; LASIK laser assisted in situ keratomileusis; HTN hypertension; DM diabetes mellitus; COPD chronic obstructive pulmonary disease

## 2019-05-02 ENCOUNTER — Telehealth: Payer: Self-pay | Admitting: Cardiovascular Disease

## 2019-05-02 NOTE — Telephone Encounter (Signed)
I spoke with patient. She reports episodes of atrial fib over the last couple of weeks.  Amiodarone was decreased and metoprolol stopped at office visit in afib clinic on 3/23. Patient received first covid vaccine about 2 weeks ago and has been feeling poorly since then.  Had chills, fever and no appetite for a few days.  She reports atrial fib is worse today.  Noticed it during the night and felt dizzy and and as if she may pass out. She does not know heart rate but can feel it is irregular.  States she has lost weight recently. Still feeling dizzy when she is moving around She checked BP and heart rate while on the phone with me. BP 173/71, heart rate 137.  I advised her to call 911 due to elevated heart rate and on going dizziness.

## 2019-05-02 NOTE — Telephone Encounter (Signed)
Pt c/o medication issue:  1. Name of Medication: amiodarone (PACERONE) 200 MG tablet  2. How are you currently taking this medication (dosage and times per day)? 1 tablet (200 mg total) by mouth daily  3. Are you having a reaction (difficulty breathing--STAT)? No  4. What is your medication issue? Patient states she was advised by another doctor to decrease intake of medication and she assumes decrease has caused afib to become worse. Please call to discuss.   Patient c/o Palpitations:  High priority if patient c/o lightheadedness, shortness of breath, or chest pain  1) How long have you had palpitations/irregular HR/ Afib? Are you having the symptoms now? She states afib worsened when she was advise to decrease intake of amiodarone (PACERONE) 200 MG tablet medication. Patient states she is experiencing afib right now  2) Are you currently experiencing lightheadedness, SOB or CP? Lightheadedness  3) Do you have a history of afib (atrial fibrillation) or irregular heart rhythm? Yes  4) Have you checked your BP or HR? (document readings if available): No  5) Are you experiencing any other symptoms? No  STAT if patient feels like he/she is going to faint   1) Are you dizzy now? No  2) Do you feel faint or have you passed out? No  3) Do you have any other symptoms? No  4) Have you checked your HR and BP (record if available)? No

## 2019-05-05 NOTE — Telephone Encounter (Signed)
Spoke with the patient to check on her status. She stated that she is having afib episodes on and off. When she has those episodes, she is having shortness of breath and fatigue.   Her amiodarone was recently decreased to 200 mg once daily due to nausea. She stated that she felt like the 200 mg bid did help control the afib but it made her feel bad.   The last she checked her blood pressure was 154/93 and her heart rate was 89. She does still have the Metoprolol 25 mg as needed. She has been advised that she could try that to see if this helped manage her afib when she was symptomatic from it. She has been advised to only take this once daily. She will try this and call back with an update.

## 2019-05-05 NOTE — Telephone Encounter (Signed)
Unfortunately, the amiodarone takes several weeks to fully "kick in", up to about 3 months. I would wait until then before making additional med adjustments.

## 2019-05-06 ENCOUNTER — Other Ambulatory Visit: Payer: Self-pay | Admitting: Physician Assistant

## 2019-05-19 ENCOUNTER — Other Ambulatory Visit: Payer: Self-pay | Admitting: Family Medicine

## 2019-06-03 ENCOUNTER — Encounter (INDEPENDENT_AMBULATORY_CARE_PROVIDER_SITE_OTHER): Payer: Medicare Other | Admitting: Ophthalmology

## 2019-06-11 ENCOUNTER — Encounter (INDEPENDENT_AMBULATORY_CARE_PROVIDER_SITE_OTHER): Payer: Self-pay | Admitting: Ophthalmology

## 2019-06-11 ENCOUNTER — Other Ambulatory Visit: Payer: Self-pay

## 2019-06-11 ENCOUNTER — Ambulatory Visit (INDEPENDENT_AMBULATORY_CARE_PROVIDER_SITE_OTHER): Payer: Medicare Other | Admitting: Ophthalmology

## 2019-06-11 DIAGNOSIS — H353211 Exudative age-related macular degeneration, right eye, with active choroidal neovascularization: Secondary | ICD-10-CM

## 2019-06-11 DIAGNOSIS — H3561 Retinal hemorrhage, right eye: Secondary | ICD-10-CM | POA: Diagnosis not present

## 2019-06-11 MED ORDER — BEVACIZUMAB CHEMO INJECTION 1.25MG/0.05ML SYRINGE FOR KALEIDOSCOPE
1.2500 mg | INTRAVITREAL | Status: AC | PRN
  Administered 2019-06-11: 1.25 mg via INTRAVITREAL

## 2019-06-11 NOTE — Assessment & Plan Note (Signed)
In the foveal avascular zone area presence of dot hemorrhage, rap-like type findings

## 2019-06-11 NOTE — Progress Notes (Signed)
06/11/2019     CHIEF COMPLAINT Patient presents for Retina Follow Up   HISTORY OF PRESENT ILLNESS: [name] is a 86 y.o. female who presents to the clinic today for:   HPI    Retina Follow Up    Patient presents with  Wet AMD.  In right eye.  This started 6 weeks ago.  Severity is mild.  Duration of 6 weeks.  Since onset it is stable.          Comments    6 Week AMD F/U OD, poss Avastin OD  Pt reports fluctuating VA OD. Pt denies any other new symptoms OU.       Last edited by Rockie Neighbours, Leslie on 06/11/2019  8:46 AM. (History)      Referring physician: [name], DO [place],  [address] [place] [address]  HISTORICAL INFORMATION:   Selected notes from the MEDICAL RECORD NUMBER    Lab Results  Component Value Date   HGBA1C 7.0 (H) 10/23/2018     CURRENT MEDICATIONS: Current Outpatient Medications (Ophthalmic Drugs)  Medication Sig  . carboxymethylcellulose (REFRESH PLUS) 0.5 % SOLN Place 1 drop into both eyes 3 (three) times daily as needed (dry/irritated eyes.).   No current facility-administered medications for this visit. (Ophthalmic Drugs)   Current Outpatient Medications (Other)  Medication Sig  . amiodarone (PACERONE) 200 MG tablet Take 1 tablet (200 mg total) by mouth daily.  Marland Kitchen amLODipine (NORVASC) 2.5 MG tablet TAKE 1 TABLET DAILY  . Calcium Citrate-Vitamin D (CITRACAL + D PO) Take 1 tablet by mouth every evening.   . diphenoxylate-atropine (LOMOTIL) 2.5-0.025 MG tablet TAKE 1 TABLET EVERY 8 HOURS AS NEEDED FOR DIARRHEA  . ELIQUIS 2.5 MG TABS tablet TAKE  (1)  TABLET TWICE A DAY. (Patient taking differently: Take 2.5 mg by mouth 2 (two) times daily. )  . isosorbide mononitrate (IMDUR) 30 MG 24 hr tablet Take 0.5 tablets (15 mg total) by mouth daily.  Marland Kitchen loratadine (CLARITIN) 10 MG tablet Take 1 tablet (10 mg total) by mouth daily. (for itching) (Patient taking differently: Take 10 mg by mouth daily as needed ((for itching)). )  .  metoprolol tartrate (LOPRESSOR) 25 MG tablet Take 1 tablet (25 mg) daily as needed for elevated for heart rate  . Multiple Vitamins-Minerals (CENTRUM SILVER ULTRA WOMENS PO) Take 1 tablet by mouth daily.  . nitroGLYCERIN (NITROSTAT) 0.4 MG SL tablet Place 1 tablet (0.4 mg total) under the tongue every 5 (five) minutes as needed for chest pain.  . pantoprazole (PROTONIX) 40 MG tablet Take 1 tablet (40 mg total) by mouth every evening.  Marland Kitchen PARoxetine (PAXIL) 20 MG tablet TAKE (1) TABLET DAILY IN THE MORNING.  . rosuvastatin (CRESTOR) 40 MG tablet TAKE (1/2) TABLET DAILY.  Marland Kitchen torsemide (DEMADEX) 20 MG tablet Take 1 tablet (20 mg total) by mouth daily as needed (edema).  . Vitamin D, Ergocalciferol, (DRISDOL) 1.25 MG (50000 UNIT) CAPS capsule TAKE 1 CAPSULE ONCE EVERY 7 DAYS   No current facility-administered medications for this visit. (Other)      REVIEW OF SYSTEMS:    ALLERGIES Allergies  Allergen Reactions  . Sotalol Other (See Comments)    Torsades   . Bactrim [Sulfamethoxazole-Trimethoprim]     Itchy rash  . Ciprofloxacin Nausea Only  . Actos [Pioglitazone] Swelling  . Adhesive [Tape] Other (See Comments)    Causes sores  . Contrast Media [Iodinated Diagnostic Agents] Other (See Comments)    Headache (no  action/pre-med required)  . Latex Rash  . Lipitor [Atorvastatin] Other (See Comments)    myalgia    PAST MEDICAL HISTORY Past Medical History:  Diagnosis Date  . AICD (automatic cardioverter/defibrillator) present   . Allergy    SESONAL  . Anxiety   . ARTHRITIS   . Arthritis   . Atrial fibrillation ([place])   . CAD (coronary artery disease)    a. history of cardiac arrest 1993;  b. s/p LAD/LCX stenting in 2003;  c. 07/2011 Cath: LM nl, LAD patent stent, D1 80ost, LCX patent stent, RCA min irregs;  d. 04/2012 MV: EF 66%, no ishcemia;  e. 12/2013 Echo: Ef 55%, no rwma, Gr 1 DD, triv AI/MR, mildly dil LA;  f. 12/2013 Lexi MV: intermediate risk - apical ischemia and inf/infsept  fixed defect, ? artifactual.  . CAD in native artery 12/31/2013   Previous stents to LAD and Ramus patent on cath today 12/31/13 also There is severe disease in the ostial first diagonal which is unchanged from most recent cardiac catheterization. The right coronary artery could not be engaged selectively but nonselective angiography showed no significant disease in the proximal and midsegment.     . Cataract    DENIES  . Chronic back pain   . Chronic sinus bradycardia   . CKD (chronic kidney disease), stage III   . Clotting disorder (HCC)    DVT  . COLITIS 12/02/2007   Qualifier: Diagnosis of  By: Nils Pyle CMA ([place]), [name]    . Diabetes mellitus without complication ([place])    DENIES  . Diverticulosis of colon (without mention of hemorrhage) 2007   Colonoscopy   . Esophageal stricture    a. 2012 s/p dil.  . Esophagitis, unspecified    a. 2012 EGD  . EXTERNAL HEMORRHOIDS   . GERD (gastroesophageal reflux disease)    omeprazole  . H/O hiatal hernia   . Hiatal hernia    a. 2012 EGD.  Marland Kitchen History of DVT in the past, not on Coumadin now    left  leg  . HYPERCHOLESTEROLEMIA   . Hypertension   . ICD (implantable cardiac defibrillator) in place    a. s/p initial ICD in 1993 in setting of cardiac arrest;  b. 01/2007 gen change: Guidant T135 Vitality DS VR single lead ICD.  . Macular degeneration    gets injecton in eye every 5 weeks- last injection - 05/03/2013   . Myocardial infarction ([place]) 1993  . Near syncope 05/22/2015  . Nephrolithiasis, just saw Dr [name]   . Orthostatic hypotension   . Peripheral vascular disease ([place])    ???  . RECTAL BLEEDING 12/03/2007   Qualifier: Diagnosis of  By: Sharlett Iles MD Byrd Hesselbach   . Unspecified gastritis and gastroduodenitis without mention of hemorrhage    a. 2003 EGD->not noted on 2012 EGD.  Marland Kitchen Unstable angina, neg MI, cath stable.maybe GI 12/30/2013   Past Surgical History:  Procedure Laterality Date  . BREAST BIOPSY Left 11/2013  .  CARDIAC CATHETERIZATION    . CARDIAC CATHETERIZATION  01/01/15   patent stents -there is severe disease in ostial 1st diag but without change.   Marland Kitchen CARDIAC DEFIBRILLATOR PLACEMENT  02/05/2007   Guidant  . CARDIOVASCULAR STRESS TEST  12/30/2013   abnormal  . CARDIOVERSION N/A 02/13/2019   Procedure: CARDIOVERSION;  Surgeon: Sanda Klein, MD;  Location: [facility];  Service: Cardiovascular;  Laterality: N/A;  . CATARACT EXTRACTION Right    Dr. Katy Fitch  . COLONOSCOPY    .  coronary stents     . CYSTOSCOPY WITH URETEROSCOPY Right 05/20/2013   Procedure: CYSTOSCOPY, RIGHT URETEROSCOPY STONE EXTRACTION, Insertion of right DOUBLE J STENT ;  Surgeon: Irine Seal, MD;  Location: [facility];  Service: Urology;  Laterality: Right;  . CYSTOSCOPY WITH URETEROSCOPY AND STENT PLACEMENT N/A 06/03/2013   Procedure: SECOND LOOK CYSTOSCOPY WITH URETEROSCOPY  HOLMIUM LASER LITHO AND STONE EXTRACTION Sammie Bench ;  Surgeon: Malka So, MD;  Location: [facility];  Service: Urology;  Laterality: N/A;  . GIVENS CAPSULE STUDY N/A 10/30/2016   Procedure: GIVENS CAPSULE STUDY;  Surgeon: Irene Shipper, MD;  Location: [facility];  Service: Endoscopy;  Laterality: N/A;  NEEDS TO BE ADMITTED FOR OBSERVATION PT. HAS DEFIB  . HEMORROIDECTOMY  80's  . HOLMIUM LASER APPLICATION Right 03/20/1935   Procedure: HOLMIUM LASER APPLICATION;  Surgeon: Irine Seal, MD;  Location: [facility];  Service: Urology;  Laterality: Right;  . HYSTEROSCOPY WITH D & C  09/02/2010   Procedure: DILATATION AND CURETTAGE (D&C) /HYSTEROSCOPY;  Surgeon: Margarette Asal;  Location: [facility];  Service: Gynecology;  Laterality: N/A;  Dilation and Curettage with Hysteroscopy and Polypectomy  . IMPLANTABLE CARDIOVERTER DEFIBRILLATOR (ICD) GENERATOR CHANGE N/A 02/10/2014   Procedure: ICD GENERATOR CHANGE;  Surgeon: Sanda Klein, MD;  Location: [facility];  Service: Cardiovascular;  Laterality: N/A;  . LEFT HEART CATHETERIZATION WITH CORONARY ANGIOGRAM N/A 07/28/2011    Procedure: LEFT HEART CATHETERIZATION WITH CORONARY ANGIOGRAM;  Surgeon: Lorretta Harp, MD;  Location: [facility];  Service: Cardiovascular;  Laterality: N/A;  . LEFT HEART CATHETERIZATION WITH CORONARY ANGIOGRAM N/A 12/31/2013   Procedure: LEFT HEART CATHETERIZATION WITH CORONARY ANGIOGRAM;  Surgeon: Wellington Hampshire, MD;  Location: [facility];  Service: Cardiovascular;  Laterality: N/A;    FAMILY HISTORY Family History  Problem Relation Age of Onset  . Stroke Mother   . Other Mother        brain tumor  . Other Father        MI  . Heart attack Father   . Stroke Sister   . Macular degeneration Sister   . Diabetes Daughter   . Cancer Daughter        ovarian  . Colon polyps Daughter   . Atrial fibrillation Sister   . Hyperlipidemia Sister   . Osteoporosis Sister   . Stroke Sister   . Uterine cancer Sister   . Heart attack Brother   . Heart disease Brother   . Asthma Brother   . Breast cancer Neg Hx     SOCIAL HISTORY Social History   Tobacco Use  . Smoking status: Never Smoker  . Smokeless tobacco: Never Used  Substance Use Topics  . Alcohol use: No  . Drug use: No         OPHTHALMIC EXAM:  Base Eye Exam    Visual Acuity (ETDRS)      Right Left   Dist [place] 20/100 -1 E card @ 4'   Dist ph [place] NI NI       Tonometry (Tonopen, 8:49 AM)      Right Left   Pressure 15 18       Pupils      Pupils Dark Light Shape React APD   Right PERRL 4 3 Round Brisk None   Left PERRL 4 3 Round Brisk None       Visual Fields (Counting fingers)      Left Right     Full  Restrictions Total superior temporal deficiency        Extraocular Movement      Right Left    Full Full       Neuro/Psych    Oriented x3: Yes   Mood/Affect: Normal       Dilation    Right eye: 1.0% Mydriacyl, 2.5% Phenylephrine @ 8:49 AM        Slit Lamp and Fundus Exam    External Exam      Right Left   External Normal Normal       Slit Lamp Exam      Right Left   Lids/Lashes  Normal Normal   Conjunctiva/Sclera White and quiet White and quiet   Cornea Clear Clear   Anterior Chamber Deep and quiet Deep and quiet   Iris Round and reactive Round and reactive   Lens Posterior chamber intraocular lens Posterior chamber intraocular lens   Anterior Vitreous Normal Normal       Fundus Exam      Right Left   Posterior Vitreous Central vitreous floaters, Posterior vitreous detachment    Disc Normal    C/D Ratio 0.15    Macula Atrophy, Mottling, Retinal pigment epithelial mottling with subretinal red telangiectasia and/or CVN in the region of atrophy    Vessels Normal    Periphery Normal           IMAGING AND PROCEDURES  Imaging and Procedures for 06/11/19  OCT, Retina - OU - Both Eyes       Right Eye Quality was good. Scan locations included subfoveal. Central Foveal Thickness: 198. Progression has improved. Findings include outer retinal atrophy, central retinal atrophy.   Left Eye Quality was borderline. Scan locations included subfoveal. Central Foveal Thickness: 300. Findings include subretinal hyper-reflective material, disciform scar.   Notes OD, improved macular anatomy, minor cystoid macular edema temporal secondary to CNVM.  Currently at 6-week interval examination repeat intravitreal Avastin today       Intravitreal Injection, Pharmacologic Agent - OD - Right Eye       Time Out 06/11/2019. 9:45 AM. Confirmed correct patient, procedure, site, and patient consented.   Anesthesia Topical anesthesia was used. Anesthetic medications included Akten 3.5%.   Procedure Preparation included Tobramycin 0.3%. A 30 gauge needle was used.   Injection:  1.25 mg Bevacizumab (AVASTIN) SOLN   NDC: [address]-7, Lot: [id number]   Route: Intravitreal, Site: Right Eye, Waste: 0 mg  Post-op Post injection exam found visual acuity of at least counting fingers. The patient tolerated the procedure well. There were no complications. The patient received written  and verbal post procedure care education. Post injection medications were not given.                 ASSESSMENT/PLAN:  Exudative age-related macular degeneration of right eye with active choroidal neovascularization (HCC) Monocular patient, with improved macular anatomy, post intravitreal Avastin 6 weeks ago.  Will repeat today OD, and examination in 6 weeks OD  Retinal hemorrhage of right eye In the foveal avascular zone area presence of dot hemorrhage, rap-like type findings      ICD-10-CM   1. Exudative age-related macular degeneration of right eye with active choroidal neovascularization (HCC)  H35.3211 OCT, Retina - OU - Both Eyes    Intravitreal Injection, Pharmacologic Agent - OD - Right Eye    Bevacizumab (AVASTIN) SOLN 1.25 mg  2. Retinal hemorrhage of right eye  H35.61     1.  Repeat intravitreal Avastin OD today,  open in 6 weeks.  2.  3.  Ophthalmic Meds Ordered this visit:  Meds ordered this encounter  Medications  . Bevacizumab (AVASTIN) SOLN 1.25 mg       Return in about 6 weeks (around 07/23/2019) for OD, dilate, AVASTIN OCT.  There are no Patient Instructions on file for this visit.   Explained the diagnoses, plan, and follow up with the patient and they expressed understanding.  Patient expressed understanding of the importance of proper follow up care.   Clent Demark Keyon Liller M.D. Diseases & Surgery of the Retina and Vitreous [facility]ina [facility] Diabetic [place] 06/11/19     Abbreviations: M myopia (nearsighted); A astigmatism; H hyperopia (farsighted); P presbyopia; Mrx spectacle prescription;  CTL contact lenses; OD right eye; OS left eye; OU both eyes  XT exotropia; ET esotropia; PEK punctate epithelial keratitis; PEE punctate epithelial erosions; DES dry eye syndrome; MGD meibomian gland dysfunction; ATs artificial tears; PFAT's preservative free artificial tears; [place] nuclear sclerotic cataract; PSC posterior subcapsular cataract; ERM epi-retinal membrane;  PVD posterior vitreous detachment; RD retinal detachment; DM diabetes mellitus; DR diabetic retinopathy; NPDR non-proliferative diabetic retinopathy; PDR proliferative diabetic retinopathy; CSME clinically significant macular edema; DME diabetic macular edema; dbh dot blot hemorrhages; CWS cotton wool spot; POAG primary open angle glaucoma; C/D cup-to-disc ratio; HVF humphrey visual field; GVF goldmann visual field; OCT optical coherence tomography; IOP intraocular pressure; BRVO Branch retinal vein occlusion; CRVO central retinal vein occlusion; CRAO central retinal artery occlusion; BRAO branch retinal artery occlusion; RT retinal tear; SB scleral buckle; PPV pars plana vitrectomy; VH Vitreous hemorrhage; PRP panretinal laser photocoagulation; IVK intravitreal kenalog; VMT vitreomacular traction; MH Macular hole;  NVD neovascularization of the disc; NVE neovascularization elsewhere; AREDS age related eye disease study; ARMD age related macular degeneration; POAG primary open angle glaucoma; EBMD epithelial/anterior basement membrane dystrophy; ACIOL anterior chamber intraocular lens; IOL intraocular lens; PCIOL posterior chamber intraocular lens; Phaco/IOL phacoemulsification with intraocular lens placement; [place] photorefractive keratectomy; LASIK laser assisted in situ keratomileusis; HTN hypertension; DM diabetes mellitus; COPD chronic obstructive pulmonary disease

## 2019-06-11 NOTE — Assessment & Plan Note (Signed)
Monocular patient, with improved macular anatomy, post intravitreal Avastin 6 weeks ago.  Will repeat today OD, and examination in 6 weeks OD

## 2019-07-02 ENCOUNTER — Other Ambulatory Visit: Payer: Self-pay | Admitting: Cardiovascular Disease

## 2019-07-04 ENCOUNTER — Other Ambulatory Visit: Payer: Self-pay | Admitting: Cardiovascular Disease

## 2019-07-04 ENCOUNTER — Other Ambulatory Visit: Payer: Self-pay | Admitting: Family Medicine

## 2019-07-04 DIAGNOSIS — I480 Paroxysmal atrial fibrillation: Secondary | ICD-10-CM

## 2019-07-04 NOTE — Telephone Encounter (Signed)
Renal function has improved since starting medication last year.  Remain on reduced dose or increase to 5 mg BID?

## 2019-07-04 NOTE — Telephone Encounter (Signed)
Thanks, Gerald Stabs. We should increase back to the 5 mg BID dose.

## 2019-07-04 NOTE — Telephone Encounter (Signed)
Rx sent to pharmacy   

## 2019-07-04 NOTE — Telephone Encounter (Signed)
Prescription refill request for Eliquis received.  Last office visit: [place], 04/08/2019 Scr: 1.20, 03/11/2019 Age: 86 y.o. Weight: 62.6 kg      Pt qualifies for a dose change.

## 2019-07-09 ENCOUNTER — Ambulatory Visit (HOSPITAL_COMMUNITY)
Admission: RE | Admit: 2019-07-09 | Discharge: 2019-07-09 | Disposition: A | Discharge status: Home / Self Care | Payer: Medicare Other | Source: Ambulatory Visit | Attending: Physician Assistant | Admitting: Physician Assistant

## 2019-07-09 ENCOUNTER — Other Ambulatory Visit: Payer: Self-pay

## 2019-07-09 VITALS — BP 150/74 | HR 50 | Ht 63.0 in | Wt 138.6 lb

## 2019-07-09 DIAGNOSIS — Z95810 Presence of automatic (implantable) cardiac defibrillator: Secondary | ICD-10-CM | POA: Insufficient documentation

## 2019-07-09 DIAGNOSIS — I5042 Chronic combined systolic (congestive) and diastolic (congestive) heart failure: Secondary | ICD-10-CM | POA: Insufficient documentation

## 2019-07-09 DIAGNOSIS — Z833 Family history of diabetes mellitus: Secondary | ICD-10-CM | POA: Insufficient documentation

## 2019-07-09 DIAGNOSIS — I2510 Atherosclerotic heart disease of native coronary artery without angina pectoris: Secondary | ICD-10-CM | POA: Insufficient documentation

## 2019-07-09 DIAGNOSIS — H3530 Unspecified macular degeneration: Secondary | ICD-10-CM | POA: Insufficient documentation

## 2019-07-09 DIAGNOSIS — I252 Old myocardial infarction: Secondary | ICD-10-CM | POA: Diagnosis not present

## 2019-07-09 DIAGNOSIS — Z888 Allergy status to other drugs, medicaments and biological substances status: Secondary | ICD-10-CM | POA: Diagnosis not present

## 2019-07-09 DIAGNOSIS — N183 Chronic kidney disease, stage 3 unspecified: Secondary | ICD-10-CM | POA: Insufficient documentation

## 2019-07-09 DIAGNOSIS — Z881 Allergy status to other antibiotic agents status: Secondary | ICD-10-CM | POA: Diagnosis not present

## 2019-07-09 DIAGNOSIS — Z882 Allergy status to sulfonamides status: Secondary | ICD-10-CM | POA: Insufficient documentation

## 2019-07-09 DIAGNOSIS — M1990 Unspecified osteoarthritis, unspecified site: Secondary | ICD-10-CM | POA: Diagnosis not present

## 2019-07-09 DIAGNOSIS — I4819 Other persistent atrial fibrillation: Secondary | ICD-10-CM | POA: Diagnosis not present

## 2019-07-09 DIAGNOSIS — Z825 Family history of asthma and other chronic lower respiratory diseases: Secondary | ICD-10-CM | POA: Diagnosis not present

## 2019-07-09 DIAGNOSIS — Z91041 Radiographic dye allergy status: Secondary | ICD-10-CM | POA: Insufficient documentation

## 2019-07-09 DIAGNOSIS — Z823 Family history of stroke: Secondary | ICD-10-CM | POA: Insufficient documentation

## 2019-07-09 DIAGNOSIS — Z955 Presence of coronary angioplasty implant and graft: Secondary | ICD-10-CM | POA: Insufficient documentation

## 2019-07-09 DIAGNOSIS — F419 Anxiety disorder, unspecified: Secondary | ICD-10-CM | POA: Diagnosis not present

## 2019-07-09 DIAGNOSIS — Z7901 Long term (current) use of anticoagulants: Secondary | ICD-10-CM | POA: Diagnosis not present

## 2019-07-09 DIAGNOSIS — Z8349 Family history of other endocrine, nutritional and metabolic diseases: Secondary | ICD-10-CM | POA: Insufficient documentation

## 2019-07-09 DIAGNOSIS — D6869 Other thrombophilia: Secondary | ICD-10-CM

## 2019-07-09 DIAGNOSIS — I472 Ventricular tachycardia: Secondary | ICD-10-CM | POA: Diagnosis not present

## 2019-07-09 DIAGNOSIS — Z86718 Personal history of other venous thrombosis and embolism: Secondary | ICD-10-CM | POA: Diagnosis not present

## 2019-07-09 DIAGNOSIS — I130 Hypertensive heart and chronic kidney disease with heart failure and stage 1 through stage 4 chronic kidney disease, or unspecified chronic kidney disease: Secondary | ICD-10-CM | POA: Diagnosis not present

## 2019-07-09 DIAGNOSIS — K219 Gastro-esophageal reflux disease without esophagitis: Secondary | ICD-10-CM | POA: Insufficient documentation

## 2019-07-09 DIAGNOSIS — Z79899 Other long term (current) drug therapy: Secondary | ICD-10-CM | POA: Diagnosis not present

## 2019-07-09 DIAGNOSIS — I480 Paroxysmal atrial fibrillation: Secondary | ICD-10-CM | POA: Diagnosis not present

## 2019-07-09 DIAGNOSIS — I4891 Unspecified atrial fibrillation: Secondary | ICD-10-CM | POA: Diagnosis present

## 2019-07-09 DIAGNOSIS — Z91040 Latex allergy status: Secondary | ICD-10-CM | POA: Insufficient documentation

## 2019-07-09 LAB — COMPREHENSIVE METABOLIC PANEL
ALT: 38 U/L (ref 0–44)
AST: 47 U/L — ABNORMAL HIGH (ref 15–41)
Albumin: 3.9 g/dL (ref 3.5–5.0)
Alkaline Phosphatase: 83 U/L (ref 38–126)
Anion gap: 10 (ref 5–15)
BUN: 17 mg/dL (ref 8–23)
CO2: 26 mmol/L (ref 22–32)
Calcium: 9.7 mg/dL (ref 8.9–10.3)
Chloride: 103 mmol/L (ref 98–111)
Creatinine, Ser: 1.71 mg/dL — ABNORMAL HIGH (ref 0.44–1.00)
GFR calc Af Amer: 31 mL/min — ABNORMAL LOW (ref >60)
GFR calc non Af Amer: 27 mL/min — ABNORMAL LOW (ref >60)
Glucose, Bld: 112 mg/dL — ABNORMAL HIGH (ref 70–99)
Potassium: 4.9 mmol/L (ref 3.5–5.1)
Sodium: 139 mmol/L (ref 135–145)
Total Bilirubin: 1 mg/dL (ref 0.3–1.2)
Total Protein: 7.4 g/dL (ref 6.5–8.1)

## 2019-07-09 LAB — TSH: TSH: 51.373 u[IU]/mL — ABNORMAL HIGH (ref 0.350–4.500)

## 2019-07-09 MED ORDER — APIXABAN 2.5 MG PO TABS: ORAL_TABLET | ORAL | Status: DC

## 2019-07-09 MED ORDER — ROSUVASTATIN CALCIUM 40 MG PO TABS: 20.0000 mg | ORAL_TABLET | Freq: Every day | ORAL | Status: DC

## 2019-07-09 MED ORDER — METOPROLOL TARTRATE 25 MG PO TABS: ORAL_TABLET | ORAL | Status: DC

## 2019-07-09 NOTE — Progress Notes (Signed)
Agree with stopping metoprolol .Thanks

## 2019-07-09 NOTE — Patient Instructions (Addendum)
Stop metoprolol  Follow up with Dr. Loletha Grayer in 3 months

## 2019-07-09 NOTE — Progress Notes (Signed)
Primary Care Physician: [name], DO Primary Cardiologist: Dr Sallyanne Kuster Primary Electrophysiologist: none Referring Physician: Roby Lofts PA-C/Dr Croitoru   [name] is a 86 y.o. female with a history of CAD s/p PCI to LAD/LCx in 2003, chronic combined CHF, catecholamine adrenergic polymorphic VT s/p ICD with generator change out in 2016, persistent atrial fibrillation, orthostatic hypotension, HLD, DM type 2, CKD stage 3 who presents for follow up in the [facility]. Patient is on Eliquis for a CHADS2VASC score of 5. Patient was noted to be persistently in afib after a COVID-19 infection in 12/2018. She underwent successful DCCV with Dr. Sallyanne Kuster 02/13/19. Unfortunately she presented to the ED 02/21/19 with complaints of recurrent atrial fibrillation. Patient has SOB and fatigue with her afib.   On follow up today, patient reports that for the last 2 weeks she has had more fatigue with intermittent dizziness. She reports she has been taking Lopressor daily. She is V paced today HR 50. She denies bleeding issues on anticoagulation.   Today, she denies symptoms of palpitations, chest pain, orthopnea, PND, lower extremity edema, presyncope, syncope, snoring, daytime somnolence, bleeding, or neurologic sequela. The patient is tolerating medications without difficulties and is otherwise without complaint today.    Atrial Fibrillation Risk Factors:  she does not have symptoms or diagnosis of sleep apnea. she does not have a history of rheumatic fever.   she has a BMI of Body mass index is 24.55 kg/m.Marland Kitchen Filed Weights   07/09/19 1506  Weight: 62.9 kg    Family History  Problem Relation Age of Onset  . Stroke Mother   . Other Mother        brain tumor  . Other Father        MI  . Heart attack Father   . Stroke Sister   . Macular degeneration Sister   . Diabetes Daughter   . Cancer Daughter        ovarian  . Colon polyps Daughter   .  Atrial fibrillation Sister   . Hyperlipidemia Sister   . Osteoporosis Sister   . Stroke Sister   . Uterine cancer Sister   . Heart attack Brother   . Heart disease Brother   . Asthma Brother   . Breast cancer Neg Hx      Atrial Fibrillation Management history:  Previous antiarrhythmic drugs: sotalol, amiodarone Previous cardioversions: 02/13/19 Previous ablations: none CHADS2VASC score: 5 Anticoagulation history: Eliquis   Past Medical History:  Diagnosis Date  . AICD (automatic cardioverter/defibrillator) present   . Allergy    SESONAL  . Anxiety   . ARTHRITIS   . Arthritis   . Atrial fibrillation ([place])   . CAD (coronary artery disease)    a. history of cardiac arrest 1993;  b. s/p LAD/LCX stenting in 2003;  c. 07/2011 Cath: LM nl, LAD patent stent, D1 80ost, LCX patent stent, RCA min irregs;  d. 04/2012 MV: EF 66%, no ishcemia;  e. 12/2013 Echo: Ef 55%, no rwma, Gr 1 DD, triv AI/MR, mildly dil LA;  f. 12/2013 Lexi MV: intermediate risk - apical ischemia and inf/infsept fixed defect, ? artifactual.  . CAD in native artery 12/31/2013   Previous stents to LAD and Ramus patent on cath today 12/31/13 also There is severe disease in the ostial first diagonal which is unchanged from most recent cardiac catheterization. The right coronary artery could not be engaged selectively but nonselective angiography showed no significant disease in the proximal  and midsegment.     . Cataract    DENIES  . Chronic back pain   . Chronic sinus bradycardia   . CKD (chronic kidney disease), stage III   . Clotting disorder (HCC)    DVT  . COLITIS 12/02/2007   Qualifier: Diagnosis of  By: Nils Pyle CMA ([place]), [name]    . Diabetes mellitus without complication ([place])    DENIES  . Diverticulosis of colon (without mention of hemorrhage) 2007   Colonoscopy   . Esophageal stricture    a. 2012 s/p dil.  . Esophagitis, unspecified    a. 2012 EGD  . EXTERNAL HEMORRHOIDS   . GERD (gastroesophageal  reflux disease)    omeprazole  . H/O hiatal hernia   . Hiatal hernia    a. 2012 EGD.  Marland Kitchen History of DVT in the past, not on Coumadin now    left  leg  . HYPERCHOLESTEROLEMIA   . Hypertension   . ICD (implantable cardiac defibrillator) in place    a. s/p initial ICD in 1993 in setting of cardiac arrest;  b. 01/2007 gen change: Guidant T135 Vitality DS VR single lead ICD.  . Macular degeneration    gets injecton in eye every 5 weeks- last injection - 05/03/2013   . Myocardial infarction ([place]) 1993  . Near syncope 05/22/2015  . Nephrolithiasis, just saw Dr [name]   . Orthostatic hypotension   . Peripheral vascular disease ([place])    ???  . RECTAL BLEEDING 12/03/2007   Qualifier: Diagnosis of  By: Sharlett Iles MD Byrd Hesselbach   . Unspecified gastritis and gastroduodenitis without mention of hemorrhage    a. 2003 EGD->not noted on 2012 EGD.  Marland Kitchen Unstable angina, neg MI, cath stable.maybe GI 12/30/2013   Past Surgical History:  Procedure Laterality Date  . BREAST BIOPSY Left 11/2013  . CARDIAC CATHETERIZATION    . CARDIAC CATHETERIZATION  01/01/15   patent stents -there is severe disease in ostial 1st diag but without change.   Marland Kitchen CARDIAC DEFIBRILLATOR PLACEMENT  02/05/2007   Guidant  . CARDIOVASCULAR STRESS TEST  12/30/2013   abnormal  . CARDIOVERSION N/A 02/13/2019   Procedure: CARDIOVERSION;  Surgeon: Sanda Klein, MD;  Location: [facility] [place];  Service: Cardiovascular;  Laterality: N/A;  . CATARACT EXTRACTION Right    Dr. Katy Fitch  . COLONOSCOPY    . coronary stents     . CYSTOSCOPY WITH URETEROSCOPY Right 05/20/2013   Procedure: CYSTOSCOPY, RIGHT URETEROSCOPY STONE EXTRACTION, Insertion of right DOUBLE J STENT ;  Surgeon: Irine Seal, MD;  Location: [facility];  Service: Urology;  Laterality: Right;  . CYSTOSCOPY WITH URETEROSCOPY AND STENT PLACEMENT N/A 06/03/2013   Procedure: SECOND LOOK CYSTOSCOPY WITH URETEROSCOPY  HOLMIUM LASER LITHO AND STONE EXTRACTION Sammie Bench ;  Surgeon: Malka So, MD;  Location: [facility];  Service: Urology;  Laterality: N/A;  . GIVENS CAPSULE STUDY N/A 10/30/2016   Procedure: GIVENS CAPSULE STUDY;  Surgeon: Irene Shipper, MD;  Location: [facility];  Service: Endoscopy;  Laterality: N/A;  NEEDS TO BE ADMITTED FOR OBSERVATION PT. HAS DEFIB  . HEMORROIDECTOMY  80's  . HOLMIUM LASER APPLICATION Right 01/21/1094   Procedure: HOLMIUM LASER APPLICATION;  Surgeon: Irine Seal, MD;  Location: [facility];  Service: Urology;  Laterality: Right;  . HYSTEROSCOPY WITH D & C  09/02/2010   Procedure: DILATATION AND CURETTAGE (D&C) /HYSTEROSCOPY;  Surgeon: Margarette Asal;  Location: [facility];  Service: Gynecology;  Laterality: N/A;  Dilation and Curettage with  Hysteroscopy and Polypectomy  . IMPLANTABLE CARDIOVERTER DEFIBRILLATOR (ICD) GENERATOR CHANGE N/A 02/10/2014   Procedure: ICD GENERATOR CHANGE;  Surgeon: Sanda Klein, MD;  Location: [facility];  Service: Cardiovascular;  Laterality: N/A;  . LEFT HEART CATHETERIZATION WITH CORONARY ANGIOGRAM N/A 07/28/2011   Procedure: LEFT HEART CATHETERIZATION WITH CORONARY ANGIOGRAM;  Surgeon: Lorretta Harp, MD;  [facility];  Service: Cardiovascular;  Laterality: N/A;  . LEFT HEART CATHETERIZATION WITH CORONARY ANGIOGRAM N/A 12/31/2013   Procedure: LEFT HEART CATHETERIZATION WITH CORONARY ANGIOGRAM;  Surgeon: Wellington Hampshire, MD;  Location: [facility];  Service: Cardiovascular;  Laterality: N/A;    Current Outpatient Medications  Medication Sig Dispense Refill  . amiodarone (PACERONE) 200 MG tablet Take 1 tablet (200 mg total) by mouth daily. 30 tablet 3  . amLODipine (NORVASC) 2.5 MG tablet TAKE 1 TABLET DAILY 90 tablet 3  . apixaban (ELIQUIS) 2.5 MG TABS tablet Take one tablet by mouth twice daily    . Calcium Citrate-Vitamin D (CITRACAL + D PO) Take 1 tablet by mouth every evening.     . carboxymethylcellulose (REFRESH PLUS) 0.5 % SOLN Place 1 drop into both eyes 3 (three) times daily as needed (dry/irritated  eyes.).    Marland Kitchen diphenoxylate-atropine (LOMOTIL) 2.5-0.025 MG tablet TAKE 1 TABLET EVERY 8 HOURS AS NEEDED FOR DIARRHEA 30 tablet 0  . isosorbide mononitrate (IMDUR) 30 MG 24 hr tablet Take 0.5 tablets (15 mg total) by mouth daily. 15 tablet 6  . loratadine (CLARITIN) 10 MG tablet Take 1 tablet (10 mg total) by mouth daily. (for itching) (Patient taking differently: Take 10 mg by mouth as needed ((for itching)). ) 30 tablet 11  . Multiple Vitamins-Minerals (CENTRUM SILVER ULTRA WOMENS PO) Take 1 tablet by mouth daily.    . nitroGLYCERIN (NITROSTAT) 0.4 MG SL tablet Place 1 tablet (0.4 mg total) under the tongue every 5 (five) minutes as needed for chest pain. 25 tablet 2  . pantoprazole (PROTONIX) 40 MG tablet Take 1 tablet (40 mg total) by mouth every evening. 90 tablet 3  . PARoxetine (PAXIL) 20 MG tablet TAKE (1) TABLET DAILY IN THE MORNING. 90 tablet 1  . rosuvastatin (CRESTOR) 40 MG tablet Take 0.5 tablets (20 mg total) by mouth daily. Needs to be seen for further refills.    . torsemide (DEMADEX) 20 MG tablet Take 1 tablet (20 mg total) by mouth daily as needed (edema). 30 tablet 0  . Vitamin D, Ergocalciferol, (DRISDOL) 1.25 MG (50000 UNIT) CAPS capsule TAKE 1 CAPSULE ONCE EVERY 7 DAYS 12 capsule 1   No current facility-administered medications for this encounter.    Allergies  Allergen Reactions  . Sotalol Other (See Comments)    Torsades   . Bactrim [Sulfamethoxazole-Trimethoprim]     Itchy rash  . Ciprofloxacin Nausea Only  . Actos [Pioglitazone] Swelling  . Adhesive [Tape] Other (See Comments)    Causes sores  . Contrast Media [Iodinated Diagnostic Agents] Other (See Comments)    Headache (no action/pre-med required)  . Latex Rash  . Lipitor [Atorvastatin] Other (See Comments)    myalgia    Social History   Socioeconomic History  . Marital status: Widowed    Spouse name: Not on file  . Number of children: 4  . Years of education: 8  . Highest education level: 8th grade   Occupational History  . Occupation: Retired  Tobacco Use  . Smoking status: Never Smoker  . Smokeless tobacco: Never Used  Vaping Use  .  Vaping Use: Never used  Substance and Sexual Activity  . Alcohol use: No  . Drug use: No  . Sexual activity: Never    Birth control/protection: Post-menopausal  Other Topics Concern  . Not on file  Social History Narrative  . Not on file   Social Determinants of Health   Financial Resource Strain: Low Risk   . Difficulty of Paying Living Expenses: Not hard at all  Food Insecurity: No Food Insecurity  . Worried About Charity fundraiser in the Last Year: Never true  . Ran Out of Food in the Last Year: Never true  Transportation Needs: No Transportation Needs  . Lack of Transportation (Medical): No  . Lack of Transportation (Non-Medical): No  Physical Activity: Inactive  . Days of Exercise per Week: 0 days  . Minutes of Exercise per Session: 0 min  Stress: No Stress Concern Present  . Feeling of Stress : Only a little  Social Connections: Moderately Integrated  . Frequency of Communication with Friends and Family: More than three times a week  . Frequency of Social Gatherings with Friends and Family: Once a week  . Attends Religious Services: More than 4 times per year  . Active Member of Clubs or Organizations: Yes  . Attends Archivist Meetings: More than 4 times per year  . Marital Status: Widowed  Intimate Partner Violence: Not At Risk  . Fear of Current or Ex-Partner: No  . Emotionally Abused: No  . Physically Abused: No  . Sexually Abused: No     ROS- All systems are reviewed and negative except as per the HPI above.  Physical Exam: Vitals:   07/09/19 1506  BP: (!) 150/74  Pulse: (!) 50  Weight: 62.9 kg  Height: 5\' 3"  (1.6 m)    GEN- The patient is well appearing elderly female, alert and oriented x 3 today.   HEENT-head normocephalic, atraumatic, sclera clear, conjunctiva pink, hearing intact, trachea  midline. Lungs- Clear to ausculation bilaterally, normal work of breathing Heart- Regular rate and rhythm, bradycardia, no murmurs, rubs or gallops  GI- soft, NT, ND, + BS Extremities- no clubbing, cyanosis, or edema MS- no significant deformity or atrophy Skin- no rash or lesion Psych- euthymic mood, full affect Neuro- strength and sensation are intact   Wt Readings from Last 3 Encounters:  07/09/19 62.9 kg  04/08/19 62.6 kg  03/21/19 69.5 kg    EKG today demonstrates V paced rhythm HR 50, QRS 170, QTc 521  Echo 01/21/19 demonstrated  1. Left ventricular ejection fraction, by visual estimation, is 40 to  45%. The left ventricle has normal function. There is no left ventricular  hypertrophy.  2. The left ventricle demonstrates global hypokinesis.  3. Global right ventricle has normal systolic function.The right  ventricular size is normal. No increase in right ventricular wall  thickness.  4. Left atrial size was mildly dilated.  5. Right atrial size was normal.  6. Mild mitral annular calcification.  7. The mitral valve is normal in structure. Mild mitral valve  regurgitation. No evidence of mitral stenosis.  8. The tricuspid valve is normal in structure.  9. The aortic valve is normal in structure. Aortic valve regurgitation is  mild. No evidence of aortic valve sclerosis or stenosis.  10. The pulmonic valve was normal in structure. Pulmonic valve  regurgitation is trivial.  11. There is mild dilatation of the ascending aorta measuring 43 mm.  12. Moderately elevated pulmonary artery systolic pressure.  13. The  tricuspid regurgitant velocity is 2.90 m/s, and with an assumed  right atrial pressure of 8 mmHg, the estimated right ventricular systolic  pressure is moderately elevated at 41.6 mmHg.  14. A pacer wire is visualized.  15. The inferior vena cava is normal in size with greater than 50%  respiratory variability, suggesting right atrial pressure of 3 mmHg.    In comparison to the previous echocardiogram(s): 09/27/17 EF 55-60%. PA  pressure 64mmHg.  EF appears reduced when compared to prior.   Epic records are reviewed at length today  CHA2DS2-VASc Score = 5 The patient's score is based upon: CHF History: No HTN History: No Age : 19 + Diabetes History: Yes Stroke History: No Vascular Disease History: Yes Gender: Female     ASSESSMENT AND PLAN: 1. Persistent Atrial Fibrillation (ICD10:  I48.19) The patient's CHA2DS2-VASc score is 5, indicating a 7.2% annual risk of stroke.   S/p DCCV on 02/13/19 with ERAF. Would avoid class IC and Multaq given CAD and CHF. H/o torsades on sotalol.  On device interrogation today, she was in sinus bradycardia when her pacing rate was decreased. She is V pacing <1%.  Continue amiodarone 200 mg daily Continue Eliquis 2.5 mg BID. Recheck renal function today. If <1.5, her dose will need to be increased to 5 mg BID. I have asked her to stop scheduled Lopressor and only use it PRN for heart racing.  Cmet/TSH today.  2. Secondary Hypercoagulable State (ICD10:  D68.69) The patient is at significant risk for stroke/thromboembolism based upon her CHA2DS2-VASc Score of 5.  Continue Apixaban (Eliquis).   3. Chronic combined systolic/diastolic CHF EF 94-50% on echo No signs or symptoms of fluid overload today.  4. CAD S/p PCI to LAD and LCx in 2003. No anginal symptoms.  5. VT S/p ICD, followed by Dr Sallyanne Kuster and the device clinic.   Follow up with Dr Sallyanne Kuster in 3 months. AF clinic in 6 months.    [facility] [address] [place], [place] [address] [phone] 07/09/2019 4:51 PM

## 2019-07-10 ENCOUNTER — Telehealth: Payer: Self-pay | Admitting: *Deleted

## 2019-07-10 ENCOUNTER — Other Ambulatory Visit: Payer: Self-pay | Admitting: Family Medicine

## 2019-07-10 DIAGNOSIS — R7989 Other specified abnormal findings of blood chemistry: Secondary | ICD-10-CM

## 2019-07-10 DIAGNOSIS — I480 Paroxysmal atrial fibrillation: Secondary | ICD-10-CM

## 2019-07-10 DIAGNOSIS — Z5181 Encounter for therapeutic drug level monitoring: Secondary | ICD-10-CM

## 2019-07-10 MED ORDER — LEVOTHYROXINE SODIUM 50 MCG PO CAPS: 50.0000 ug | ORAL_CAPSULE | Freq: Every day | ORAL | 3 refills | Status: DC

## 2019-07-10 NOTE — Telephone Encounter (Addendum)
-----   Message from Sanda Klein, MD sent at 07/10/2019  9:15 AM EDT ----- Please start levothyroxine 50 mcg once daily, #90, RF 3 and recheck TSH in  4 weeks  Spoke with pt, aware of dr croitoru's recommendations. New script sent to the pharmacy and Lab orders mailed to the pt

## 2019-07-22 ENCOUNTER — Telehealth: Payer: Self-pay | Admitting: *Deleted

## 2019-07-22 NOTE — Telephone Encounter (Signed)
Patient called stating that she is having dizziness with movement for 1 week and has worsened over the last 3 days.  Patient also complains of fatigue and UTI symptoms.  No available appts until tomorrow at 2:40 with Dr. Warrick Parisian.  Patient states that she will go on to the Urgent Care to be evaluated.

## 2019-07-23 ENCOUNTER — Ambulatory Visit (INDEPENDENT_AMBULATORY_CARE_PROVIDER_SITE_OTHER): Payer: Medicare Other | Admitting: *Deleted

## 2019-07-23 ENCOUNTER — Encounter (INDEPENDENT_AMBULATORY_CARE_PROVIDER_SITE_OTHER): Payer: Medicare Other | Admitting: Ophthalmology

## 2019-07-23 DIAGNOSIS — I472 Ventricular tachycardia, unspecified: Secondary | ICD-10-CM

## 2019-07-23 LAB — CUP PACEART REMOTE DEVICE CHECK
Battery Remaining Longevity: 138 mo
Battery Remaining Percentage: 100 %
Brady Statistic RV Percent Paced: 1 %
Date Time Interrogation Session: 20210707005400
HighPow Impedance: 39 Ohm
Implantable Lead Implant Date: 20090120
Implantable Lead Location: 753860
Implantable Lead Model: 157
Implantable Lead Serial Number: 138237
Implantable Pulse Generator Implant Date: 20160126
Lead Channel Impedance Value: 576 Ohm
Lead Channel Pacing Threshold Amplitude: 0.8 V
Lead Channel Pacing Threshold Pulse Width: 0.5 ms
Lead Channel Setting Pacing Amplitude: 2.2 V
Lead Channel Setting Pacing Pulse Width: 0.5 ms
Lead Channel Setting Sensing Sensitivity: 0.6 mV
Pulse Gen Serial Number: 191956

## 2019-07-24 ENCOUNTER — Other Ambulatory Visit: Payer: Self-pay

## 2019-07-24 ENCOUNTER — Telehealth: Payer: Self-pay | Admitting: Cardiovascular Disease

## 2019-07-24 ENCOUNTER — Encounter (HOSPITAL_COMMUNITY): Payer: Self-pay | Admitting: Emergency Medicine

## 2019-07-24 ENCOUNTER — Inpatient Hospital Stay (HOSPITAL_COMMUNITY)
Admission: EM | Admit: 2019-07-24 | Discharge: 2019-07-27 | DRG: 644 | Disposition: A | Discharge status: Home Health | Payer: Medicare Other | Attending: Internal Medicine | Admitting: Internal Medicine

## 2019-07-24 ENCOUNTER — Emergency Department (HOSPITAL_COMMUNITY): Payer: Medicare Other

## 2019-07-24 DIAGNOSIS — I10 Essential (primary) hypertension: Secondary | ICD-10-CM | POA: Diagnosis present

## 2019-07-24 DIAGNOSIS — Z888 Allergy status to other drugs, medicaments and biological substances status: Secondary | ICD-10-CM

## 2019-07-24 DIAGNOSIS — E119 Type 2 diabetes mellitus without complications: Secondary | ICD-10-CM

## 2019-07-24 DIAGNOSIS — R55 Syncope and collapse: Secondary | ICD-10-CM | POA: Diagnosis not present

## 2019-07-24 DIAGNOSIS — Z79899 Other long term (current) drug therapy: Secondary | ICD-10-CM

## 2019-07-24 DIAGNOSIS — Z823 Family history of stroke: Secondary | ICD-10-CM

## 2019-07-24 DIAGNOSIS — Z66 Do not resuscitate: Secondary | ICD-10-CM | POA: Diagnosis present

## 2019-07-24 DIAGNOSIS — Z91040 Latex allergy status: Secondary | ICD-10-CM

## 2019-07-24 DIAGNOSIS — R001 Bradycardia, unspecified: Secondary | ICD-10-CM | POA: Diagnosis present

## 2019-07-24 DIAGNOSIS — Z86718 Personal history of other venous thrombosis and embolism: Secondary | ICD-10-CM

## 2019-07-24 DIAGNOSIS — M549 Dorsalgia, unspecified: Secondary | ICD-10-CM | POA: Diagnosis present

## 2019-07-24 DIAGNOSIS — Z8049 Family history of malignant neoplasm of other genital organs: Secondary | ICD-10-CM

## 2019-07-24 DIAGNOSIS — R109 Unspecified abdominal pain: Secondary | ICD-10-CM

## 2019-07-24 DIAGNOSIS — N1832 Chronic kidney disease, stage 3b: Secondary | ICD-10-CM | POA: Diagnosis present

## 2019-07-24 DIAGNOSIS — I259 Chronic ischemic heart disease, unspecified: Secondary | ICD-10-CM | POA: Diagnosis present

## 2019-07-24 DIAGNOSIS — G8929 Other chronic pain: Secondary | ICD-10-CM | POA: Diagnosis present

## 2019-07-24 DIAGNOSIS — Z20822 Contact with and (suspected) exposure to covid-19: Secondary | ICD-10-CM | POA: Diagnosis present

## 2019-07-24 DIAGNOSIS — Z8371 Family history of colonic polyps: Secondary | ICD-10-CM

## 2019-07-24 DIAGNOSIS — Z87442 Personal history of urinary calculi: Secondary | ICD-10-CM

## 2019-07-24 DIAGNOSIS — Z825 Family history of asthma and other chronic lower respiratory diseases: Secondary | ICD-10-CM

## 2019-07-24 DIAGNOSIS — N183 Chronic kidney disease, stage 3 unspecified: Secondary | ICD-10-CM | POA: Diagnosis present

## 2019-07-24 DIAGNOSIS — E1122 Type 2 diabetes mellitus with diabetic chronic kidney disease: Secondary | ICD-10-CM | POA: Diagnosis present

## 2019-07-24 DIAGNOSIS — Z91041 Radiographic dye allergy status: Secondary | ICD-10-CM

## 2019-07-24 DIAGNOSIS — R5381 Other malaise: Secondary | ICD-10-CM | POA: Diagnosis present

## 2019-07-24 DIAGNOSIS — E785 Hyperlipidemia, unspecified: Secondary | ICD-10-CM | POA: Diagnosis present

## 2019-07-24 DIAGNOSIS — Z8262 Family history of osteoporosis: Secondary | ICD-10-CM

## 2019-07-24 DIAGNOSIS — I5022 Chronic systolic (congestive) heart failure: Secondary | ICD-10-CM | POA: Diagnosis present

## 2019-07-24 DIAGNOSIS — Z7901 Long term (current) use of anticoagulants: Secondary | ICD-10-CM

## 2019-07-24 DIAGNOSIS — E039 Hypothyroidism, unspecified: Principal | ICD-10-CM | POA: Diagnosis present

## 2019-07-24 DIAGNOSIS — E7800 Pure hypercholesterolemia, unspecified: Secondary | ICD-10-CM | POA: Diagnosis present

## 2019-07-24 DIAGNOSIS — Z83438 Family history of other disorder of lipoprotein metabolism and other lipidemia: Secondary | ICD-10-CM

## 2019-07-24 DIAGNOSIS — Z79890 Hormone replacement therapy (postmenopausal): Secondary | ICD-10-CM

## 2019-07-24 DIAGNOSIS — Z882 Allergy status to sulfonamides status: Secondary | ICD-10-CM

## 2019-07-24 DIAGNOSIS — Z9861 Coronary angioplasty status: Secondary | ICD-10-CM

## 2019-07-24 DIAGNOSIS — Z8249 Family history of ischemic heart disease and other diseases of the circulatory system: Secondary | ICD-10-CM

## 2019-07-24 DIAGNOSIS — Z95810 Presence of automatic (implantable) cardiac defibrillator: Secondary | ICD-10-CM

## 2019-07-24 DIAGNOSIS — I951 Orthostatic hypotension: Secondary | ICD-10-CM | POA: Diagnosis present

## 2019-07-24 DIAGNOSIS — I252 Old myocardial infarction: Secondary | ICD-10-CM

## 2019-07-24 DIAGNOSIS — I482 Chronic atrial fibrillation, unspecified: Secondary | ICD-10-CM | POA: Diagnosis present

## 2019-07-24 DIAGNOSIS — I480 Paroxysmal atrial fibrillation: Secondary | ICD-10-CM | POA: Diagnosis present

## 2019-07-24 DIAGNOSIS — I130 Hypertensive heart and chronic kidney disease with heart failure and stage 1 through stage 4 chronic kidney disease, or unspecified chronic kidney disease: Secondary | ICD-10-CM | POA: Diagnosis present

## 2019-07-24 DIAGNOSIS — Z833 Family history of diabetes mellitus: Secondary | ICD-10-CM

## 2019-07-24 DIAGNOSIS — I2510 Atherosclerotic heart disease of native coronary artery without angina pectoris: Secondary | ICD-10-CM | POA: Diagnosis present

## 2019-07-24 DIAGNOSIS — R079 Chest pain, unspecified: Secondary | ICD-10-CM

## 2019-07-24 LAB — BASIC METABOLIC PANEL
Anion gap: 11 (ref 5–15)
BUN: 14 mg/dL (ref 8–23)
CO2: 23 mmol/L (ref 22–32)
Calcium: 9.2 mg/dL (ref 8.9–10.3)
Chloride: 102 mmol/L (ref 98–111)
Creatinine, Ser: 1.54 mg/dL — ABNORMAL HIGH (ref 0.44–1.00)
GFR calc Af Amer: 35 mL/min — ABNORMAL LOW (ref >60)
GFR calc non Af Amer: 30 mL/min — ABNORMAL LOW (ref >60)
Glucose, Bld: 112 mg/dL — ABNORMAL HIGH (ref 70–99)
Potassium: 4.4 mmol/L (ref 3.5–5.1)
Sodium: 136 mmol/L (ref 135–145)

## 2019-07-24 LAB — CBC
HCT: 39.4 % (ref 36.0–46.0)
Hemoglobin: 12.4 g/dL (ref 12.0–15.0)
MCH: 30 pg (ref 26.0–34.0)
MCHC: 31.5 g/dL (ref 30.0–36.0)
MCV: 95.4 fL (ref 80.0–100.0)
Platelets: 250 10*3/uL (ref 150–400)
RBC: 4.13 MIL/uL (ref 3.87–5.11)
RDW: 14.9 % (ref 11.5–15.5)
WBC: 6.6 10*3/uL (ref 4.0–10.5)
nRBC: 0 % (ref 0.0–0.2)

## 2019-07-24 LAB — TROPONIN I (HIGH SENSITIVITY)
Troponin I (High Sensitivity): 9 ng/L (ref <18)
Troponin I (High Sensitivity): 8 ng/L (ref <18)

## 2019-07-24 MED ORDER — SODIUM CHLORIDE 0.9% FLUSH: 3.0000 mL | Freq: Once | INTRAVENOUS | Status: DC

## 2019-07-24 NOTE — ED Notes (Signed)
[phone] Nelva Bush daughter in law , would like an update please .

## 2019-07-24 NOTE — ED Triage Notes (Signed)
Pt c/o left cp with some nausea no vomiting no SOB.

## 2019-07-24 NOTE — Telephone Encounter (Signed)
Reviewed her ICD download. She does not have an atrial lead, but the device has recorded an abrupt increase in ventricular rates over the last 8 days or so (from 60s to 90s average), probably representing an episode of persistent atrial fibrillation (albeit with controlled rate). However, at the time of the download, she was back in normal rhythm.

## 2019-07-24 NOTE — ED Provider Notes (Signed)
[facility] EMERGENCY DEPARTMENT Provider Note   CSN: [id number] Arrival date & time: 07/24/19  1549     History Chief Complaint  Patient presents with  . Chest Pain    [name] is a 86 y.o. female with history of CAD status post PCI, AICD in place, atrial fibrillation on amiodarone and anticoagulated on Eliquis, CKD, diabetes mellitus, hypertension, hyperlipidemia, and prior VTE who presents to the ED with multiple complaints today.  Patient primarily is concerned regarding intermittent lightheadedness over the past few days. Patient states that whenever she turns her head to the left, looks up, or tries to get up and move she feels as if she is going to pass out with associated chest pressure at times. No true syncopal episode has occurred. No hx of similar. If she stays still this is better. She has also had a few days of LLQ abdominal discomfort and loose stools.  She was seen for her symptoms at an urgent care and was provided with meclizine which she states she has tried without relief.  She was also given Flagyl for what she thinks was a UTI.  She also relays she has felt generally fatigued with L sided weakness for a few weeks now.  She denies fever, visual disturbance, fever, vomiting, melena, dyspnea, or unilateral leg pain/swelling.    HPI     Past Medical History:  Diagnosis Date  . AICD (automatic cardioverter/defibrillator) present   . Allergy    SESONAL  . Anxiety   . ARTHRITIS   . Arthritis   . Atrial fibrillation ([place])   . CAD (coronary artery disease)    a. history of cardiac arrest 1993;  b. s/p LAD/LCX stenting in 2003;  c. 07/2011 Cath: LM nl, LAD patent stent, D1 80ost, LCX patent stent, RCA min irregs;  d. 04/2012 MV: EF 66%, no ishcemia;  e. 12/2013 Echo: Ef 55%, no rwma, Gr 1 DD, triv AI/MR, mildly dil LA;  f. 12/2013 Lexi MV: intermediate risk - apical ischemia and inf/infsept fixed defect, ? artifactual.  . CAD in native artery  12/31/2013   Previous stents to LAD and Ramus patent on cath today 12/31/13 also There is severe disease in the ostial first diagonal which is unchanged from most recent cardiac catheterization. The right coronary artery could not be engaged selectively but nonselective angiography showed no significant disease in the proximal and midsegment.     . Cataract    DENIES  . Chronic back pain   . Chronic sinus bradycardia   . CKD (chronic kidney disease), stage III   . Clotting disorder (HCC)    DVT  . COLITIS 12/02/2007   Qualifier: Diagnosis of  By: Nils Pyle CMA ([place]), [name]    . Diabetes mellitus without complication ([place])    DENIES  . Diverticulosis of colon (without mention of hemorrhage) 2007   Colonoscopy   . Esophageal stricture    a. 2012 s/p dil.  . Esophagitis, unspecified    a. 2012 EGD  . EXTERNAL HEMORRHOIDS   . GERD (gastroesophageal reflux disease)    omeprazole  . H/O hiatal hernia   . Hiatal hernia    a. 2012 EGD.  Marland Kitchen History of DVT in the past, not on Coumadin now    left  leg  . HYPERCHOLESTEROLEMIA   . Hypertension   . ICD (implantable cardiac defibrillator) in place    a. s/p initial ICD in 1993 in setting of cardiac arrest;  b. 01/2007 gen  change: Guidant T135 Vitality DS VR single lead ICD.  . Macular degeneration    gets injecton in eye every 5 weeks- last injection - 05/03/2013   . Myocardial infarction ([place]) 1993  . Near syncope 05/22/2015  . Nephrolithiasis, just saw Dr [name]   . Orthostatic hypotension   . Peripheral vascular disease ([place])    ???  . RECTAL BLEEDING 12/03/2007   Qualifier: Diagnosis of  By: Sharlett Iles MD Byrd Hesselbach   . Unspecified gastritis and gastroduodenitis without mention of hemorrhage    a. 2003 EGD->not noted on 2012 EGD.  Marland Kitchen Unstable angina, neg MI, cath stable.maybe GI 12/30/2013    Patient Active Problem List   Diagnosis Date Noted  . Exudative age-related macular degeneration of right eye with active choroidal  neovascularization ([place]) 04/30/2019  . Retinal hemorrhage of right eye 04/30/2019  . Advanced nonexudative age-related macular degeneration of left eye with subfoveal involvement 04/30/2019  . Exudative age-related macular degeneration of left eye with inactive choroidal neovascularization ([place]) 04/30/2019  . Advanced nonexudative age-related macular degeneration of right eye without subfoveal involvement 04/30/2019  . Posterior vitreous detachment of both eyes 04/30/2019  . Secondary hypercoagulable state ([place]) 03/11/2019  . Persistent atrial fibrillation ([place])   . Proctitis 01/09/2019  . Atrial fibrillation, chronic ([place]) 01/09/2019  . Hyperlipidemia 01/09/2019  . Nausea and vomiting 01/09/2019  . Hyperglycemia 01/09/2019  . COVID-19 virus infection 12/28/2018  . Chronic diarrhea 09/18/2018  . Abdominal pain 09/18/2018  . Osteoarthritis of right knee 03/29/2018  . GERD (gastroesophageal reflux disease)   . Diarrhea of presumed infectious origin 02/06/2018  . Essential hypertension 12/21/2017  . Degenerative scoliosis 02/19/2017  . Burst fracture of lumbar vertebra ([place]) 02/19/2017  . History of drug-induced prolonged QT interval with torsade de pointes 11/01/2016  . Iron deficiency anemia 10/30/2016  . Long term current use of anticoagulant 02/17/2016  . Near syncope 05/22/2015  . Diabetes mellitus type 2, diet-controlled ([place]) 09/14/2014  . ICD (implantable cardioverter-defibrillator) battery depletion 01/20/2014  . Abnormal nuclear stress test, 12/30/13 12/31/2013  . Coronary artery disease involving native coronary artery of native heart without angina pectoris 12/31/2013  . Paroxysmal atrial fibrillation, chads2 Vasc2 score of 5, on eliquis 10/16/2013  . Catecholaminergic polymorphic ventricular tachycardia ([place]) 10/16/2013  . Ureteral stone with hydronephrosis 05/20/2013  . Metabolic syndrome 34/19/3790  . Orthostatic hypotension 07/27/2011  . Chest pain, r/o cardiac,  negative MI 07/25/2011  . Weakness 07/25/2011    Class: Acute  . Chronic sinus bradycardia 07/25/2011  . ICD (implantable cardioverter-defibrillator) in place 07/25/2011  . CKD (chronic kidney disease) stage 3, GFR 30-59 ml/min (HCC) 07/25/2011    Class: Chronic  . History of DVT in the past, on eliquis now 07/25/2011    Class: History of  . Nephrolithiasis, just saw Dr [name] 07/25/2011    Class: History of  . DYSPHAGIA 03/24/2010  . Chronic ischemic heart disease 12/03/2007  . Irritable bowel syndrome with diarrhea 12/03/2007  . EPIGASTRIC PAIN 12/03/2007  . Hyperlipidemia associated with type 2 diabetes mellitus ([place]) 12/02/2007    Class: History of  . EXTERNAL HEMORRHOIDS 12/02/2007    Class: History of  . ESOPHAGITIS 12/02/2007  . GASTRITIS 12/02/2007  . DIVERTICULOSIS, COLON 12/02/2007  . ARTHRITIS 12/02/2007    Class: Chronic    Past Surgical History:  Procedure Laterality Date  . BREAST BIOPSY Left 11/2013  . CARDIAC CATHETERIZATION    . CARDIAC CATHETERIZATION  01/01/15   patent stents -there is severe  disease in ostial 1st diag but without change.   Marland Kitchen CARDIAC DEFIBRILLATOR PLACEMENT  02/05/2007   Guidant  . CARDIOVASCULAR STRESS TEST  12/30/2013   abnormal  . CARDIOVERSION N/A 02/13/2019   Procedure: CARDIOVERSION;  Surgeon: Sanda Klein, MD;  Location: [facility];  Service: Cardiovascular;  Laterality: N/A;  . CATARACT EXTRACTION Right    Dr. Katy Fitch  . COLONOSCOPY    . coronary stents     . CYSTOSCOPY WITH URETEROSCOPY Right 05/20/2013   Procedure: CYSTOSCOPY, RIGHT URETEROSCOPY STONE EXTRACTION, Insertion of right DOUBLE J STENT ;  Surgeon: Irine Seal, MD;  Location: [facility];  Service: Urology;  Laterality: Right;  . CYSTOSCOPY WITH URETEROSCOPY AND STENT PLACEMENT N/A 06/03/2013   Procedure: SECOND LOOK CYSTOSCOPY WITH URETEROSCOPY  HOLMIUM LASER LITHO AND STONE EXTRACTION Sammie Bench ;  Surgeon: Malka So, MD;  Location: [facility];  Service: Urology;   Laterality: N/A;  . GIVENS CAPSULE STUDY N/A 10/30/2016   Procedure: GIVENS CAPSULE STUDY;  Surgeon: Irene Shipper, MD;  Location: [facility];  Service: Endoscopy;  Laterality: N/A;  NEEDS TO BE ADMITTED FOR OBSERVATION PT. HAS DEFIB  . HEMORROIDECTOMY  80's  . HOLMIUM LASER APPLICATION Right 06/24/6293   Procedure: HOLMIUM LASER APPLICATION;  Surgeon: Irine Seal, MD;  Location: [facility];  Service: Urology;  Laterality: Right;  . HYSTEROSCOPY WITH D & C  09/02/2010   Procedure: DILATATION AND CURETTAGE (D&C) /HYSTEROSCOPY;  Surgeon: Margarette Asal;  Location: [facility];  Service: Gynecology;  Laterality: N/A;  Dilation and Curettage with Hysteroscopy and Polypectomy  . IMPLANTABLE CARDIOVERTER DEFIBRILLATOR (ICD) GENERATOR CHANGE N/A 02/10/2014   Procedure: ICD GENERATOR CHANGE;  Surgeon: Sanda Klein, MD;  Location: [facility];  Service: Cardiovascular;  Laterality: N/A;  . LEFT HEART CATHETERIZATION WITH CORONARY ANGIOGRAM N/A 07/28/2011   Procedure: LEFT HEART CATHETERIZATION WITH CORONARY ANGIOGRAM;  Surgeon: Lorretta Harp, MD;  Location: [facility];  Service: Cardiovascular;  Laterality: N/A;  . LEFT HEART CATHETERIZATION WITH CORONARY ANGIOGRAM N/A 12/31/2013   Procedure: LEFT HEART CATHETERIZATION WITH CORONARY ANGIOGRAM;  Surgeon: Wellington Hampshire, MD;  Location: [facility];  Service: Cardiovascular;  Laterality: N/A;     OB History   No obstetric history on file.     Family History  Problem Relation Age of Onset  . Stroke Mother   . Other Mother        brain tumor  . Other Father        MI  . Heart attack Father   . Stroke Sister   . Macular degeneration Sister   . Diabetes Daughter   . Cancer Daughter        ovarian  . Colon polyps Daughter   . Atrial fibrillation Sister   . Hyperlipidemia Sister   . Osteoporosis Sister   . Stroke Sister   . Uterine cancer Sister   . Heart attack Brother   . Heart disease Brother   . Asthma Brother   . Breast cancer Neg Hx      Social History   Tobacco Use  . Smoking status: Never Smoker  . Smokeless tobacco: Never Used  Vaping Use  . Vaping Use: Never used  Substance Use Topics  . Alcohol use: No  . Drug use: No    Home Medications Prior to Admission medications   Medication Sig Start Date End Date Taking? Authorizing Provider  amiodarone (PACERONE) 200 MG tablet Take 1 tablet (200 mg total) by mouth daily. 04/08/19  Yes Fenton, Clint R, PA  amLODipine (NORVASC) 2.5 MG tablet TAKE 1 TABLET DAILY Patient taking differently: Take 2.5 mg by mouth daily.  05/08/19  Yes Almyra Deforest, PA  apixaban (ELIQUIS) 2.5 MG TABS tablet Take one tablet by mouth twice daily Patient taking differently: Take 2.5 mg by mouth daily.  07/09/19  Yes Fenton, Clint R, PA  Calcium Citrate-Vitamin D (CITRACAL + D PO) Take 1 tablet by mouth daily.    Yes [provider]  carboxymethylcellulose (REFRESH PLUS) 0.5 % SOLN Place 1 drop into both eyes 3 (three) times daily as needed (dry/irritated eyes.).   Yes [provider]  ciprofloxacin (CIPRO) 500 MG tablet Take 500 mg by mouth 2 (two) times daily. 07/22/19  Yes [provider]  diphenoxylate-atropine (LOMOTIL) 2.5-0.025 MG tablet TAKE 1 TABLET EVERY 8 HOURS AS NEEDED FOR DIARRHEA Patient taking differently: Take 1 tablet by mouth daily.  03/25/19  Yes Ronnie Doss M, DO  isosorbide mononitrate (IMDUR) 30 MG 24 hr tablet Take 0.5 tablets (15 mg total) by mouth daily. 02/27/19 07/24/19 Yes Kroeger, Lorelee Cover., PA-C  Levothyroxine Sodium 50 MCG CAPS Take 1 capsule (50 mcg total) by mouth daily before breakfast. 07/10/19  Yes Croitoru, Mihai, MD  loratadine (CLARITIN) 10 MG tablet Take 1 tablet (10 mg total) by mouth daily. (for itching) Patient taking differently: Take 10 mg by mouth as needed for itching.  01/20/19  Yes Gottschalk, Leatrice Jewels M, DO  meclizine (ANTIVERT) 25 MG tablet Take 25 mg by mouth 3 (three) times daily as needed for dizziness or nausea.  07/22/19  Yes  [provider]  metoprolol tartrate (LOPRESSOR) 25 MG tablet Take 25 mg by mouth 2 (two) times daily.   Yes [provider]  metroNIDAZOLE (FLAGYL) 500 MG tablet Take 500 mg by mouth 3 (three) times daily. 07/22/19  Yes [provider]  Multiple Vitamins-Minerals (CENTRUM SILVER ULTRA WOMENS PO) Take 1 tablet by mouth daily.   Yes [provider]  nitroGLYCERIN (NITROSTAT) 0.4 MG SL tablet Place 1 tablet (0.4 mg total) under the tongue every 5 (five) minutes as needed for chest pain. 12/20/18  Yes Claretta Fraise, MD  rosuvastatin (CRESTOR) 40 MG tablet Take 0.5 tablets (20 mg total) by mouth daily. Needs to be seen for further refills. 07/09/19  Yes Fenton, Clint R, PA  torsemide (DEMADEX) 20 MG tablet Take 1 tablet (20 mg total) by mouth daily as needed (edema). 04/01/19  Yes Gottschalk, Leatrice Jewels M, DO  Vitamin D, Ergocalciferol, (DRISDOL) 1.25 MG (50000 UNIT) CAPS capsule TAKE 1 CAPSULE ONCE EVERY 7 DAYS Patient taking differently: Take 50,000 Units by mouth every 7 (seven) days.  04/17/19  Yes Gottschalk, Ashly M, DO  pantoprazole (PROTONIX) 40 MG tablet Take 1 tablet (40 mg total) by mouth every evening. 02/21/19   Ronnie Doss M, DO  PARoxetine (PAXIL) 20 MG tablet TAKE (1) TABLET DAILY IN THE MORNING. Patient taking differently: Take 20 mg by mouth daily.  05/19/19   [name], DO    Allergies    Sotalol, Bactrim [sulfamethoxazole-trimethoprim], Ciprofloxacin, Actos [pioglitazone], Adhesive [tape], Contrast media [iodinated diagnostic agents], Latex, and Lipitor [atorvastatin]  Review of Systems   Review of Systems  Constitutional: Positive for fatigue. Negative for fever.  Respiratory: Negative for shortness of breath.   Cardiovascular: Positive for chest pain.  Gastrointestinal: Positive for abdominal pain and diarrhea. Negative for blood in stool, constipation and vomiting.  Genitourinary: Negative for dysuria.  Neurological: Positive for  dizziness, tremors (  baseline, but seems somewhat worse), weakness and light-headedness. Negative for syncope, speech difficulty and numbness.  All other systems reviewed and are negative.   Physical Exam Updated Vital Signs BP 109/64 (BP Location: Right Arm)   Pulse 84   Temp 98.5 F (36.9 C) (Oral)   Resp 16   Ht 5\' 3"  (1.6 m)   Wt 62.1 kg   SpO2 95%   BMI 24.27 kg/m   Physical Exam Vitals and nursing note reviewed.  Constitutional:      General: She is not in acute distress.    Appearance: She is not toxic-appearing.  HENT:     Head: Normocephalic and atraumatic.     Right Ear: Tympanic membrane is not perforated, erythematous, retracted or bulging.     Left Ear: Tympanic membrane is not perforated, erythematous, retracted or bulging.  Eyes:     Pupils: Pupils are equal, round, and reactive to light.     Comments: PERRL. EOMI. No obvious nystagmus.   Cardiovascular:     Rate and Rhythm: Normal rate and regular rhythm.     Pulses:          Radial pulses are 2+ on the right side and 2+ on the left side.  Pulmonary:     Breath sounds: Normal breath sounds. No wheezing, rhonchi or rales.  Abdominal:     Palpations: Abdomen is soft.     Tenderness: There is abdominal tenderness (LLQ). There is no guarding or rebound.  Musculoskeletal:     Cervical back: Neck supple.     Comments: Trace symmetric pitting edema present. No significant calf tenderness.   Neurological:     Mental Status: She is alert.     Comments: Alert.  Clear speech.  CN III through XII grossly intact.  Sensation grossly intact bilateral upper and lower extremities.  Patient has slightly decreased strength in the left upper/lower extremity compared to the right upper/lower extremity, but seems to improve with encouragement. some trouble with left upper extremity finger-to-nose.     ED Results / Procedures / Treatments   Labs (all labs ordered are listed, but only abnormal results are displayed) Labs  Reviewed  BASIC METABOLIC PANEL - Abnormal; Notable for the following components:      Result Value   Glucose, Bld 112 (*)    Creatinine, Ser 1.54 (*)    GFR calc non Af Amer 30 (*)    GFR calc Af Amer 35 (*)    All other components within normal limits  CBC  TROPONIN I (HIGH SENSITIVITY)  TROPONIN I (HIGH SENSITIVITY)    EKG None  Radiology CT ABDOMEN PELVIS WO CONTRAST  Result Date: 07/25/2019 CLINICAL DATA:  Abdominal pain EXAM: CT ABDOMEN AND PELVIS WITHOUT CONTRAST TECHNIQUE: Multidetector CT imaging of the abdomen and pelvis was performed following the standard protocol without IV contrast. COMPARISON:  CT 01/11/2019 FINDINGS: Lower chest: A 4 mm nodule in the right lung base, likely stable from prior accounting for differences and basilar atelectasis. Additional bandlike areas of scarring and atelectatic change in the bilateral lower lobes, right middle lobe and lingula. Increased AP diameter of the chest similar to prior. Cardiomegaly. At least 3 pacer/defibrillator leads are in position towards the cardiac apex. Coronary artery atherosclerosis. No pericardial effusion. Hepatobiliary: Normal liver attenuation with smooth surface contour. No visible lesions on this unenhanced CT. Layering hyperdensity within the gallbladder may reflect biliary stones or sludge. No pericholecystic fluid or inflammation nor biliary ductal dilatation. No intraductal gallstones. Pancreas:  Unremarkable. No pancreatic ductal dilatation or surrounding inflammatory changes. Spleen: Normal in size without focal abnormality. Adrenals/Urinary Tract: Normal adrenal glands.Asymmetric left renal atrophy. Nonobstructing calculi seen in the bilateral collecting systems as well with some increased attenuation of the medullary pyramids could reflect underlying nephrocalcinosis. No obstructive urolithiasis or hydronephrosis. No visible or concerning renal lesion. Physiologic bladder distention without gross bladder  abnormality. Stomach/Bowel: Small sliding-type hiatal hernia. Distal stomach and duodenum are unremarkable. No small bowel thickening or dilatation. Cecum displaced slightly towards midline. A normal appendix is visualized. No colonic dilatation or wall thickening. Lack of formed stool with air and fluid in the rectal vault. Vascular/Lymphatic: Atherosclerotic calcifications within the abdominal aorta and branch vessels. No aneurysm or ectasia. No enlarged abdominopelvic lymph nodes. Reproductive: Anteverted uterus. No concerning adnexal lesions. No abdominopelvic free fluid or free gas. No bowel containing hernias. Other: Left anterior abdominal wall battery pack. Asymmetric posterior body wall edema to the left of midline. More diffuse mild body wall edema as well. No bowel containing hernias. No abdominopelvic free fluid or air. Musculoskeletal: Multilevel degenerative changes are present in the imaged portions of the spine. Severe levocurvature of the lumbar spine apex at L2-3, unchanged from prior. Stable superior endplate compression deformity L1. Additional moderate degenerative changes noted throughout the hips and pelvis. No acute or worrisome osseous lesions. IMPRESSION: 1. Lack of formed stool with air and fluid in the rectal vault. Could reflect a diarrheal illness/rapid transit state. 2. Nonobstructing bilateral nephrolithiasis. Increased attenuation of the medullary pyramids could reflect underlying nephrocalcinosis. 3. Asymmetric left renal atrophy. 4. Layering hyperdensity within the gallbladder may reflect biliary stones or sludge. No CT evidence of acute cholecystitis. If there is persisting clinical concern, right upper quadrant ultrasound could be obtained. 5. Small sliding-type hiatal hernia. 6. Cardiomegaly with coronary artery atherosclerosis. 7. Aortic Atherosclerosis (ICD10-I70.0). Electronically Signed   By: [name] M.D.   On: 07/25/2019 01:01   DG Chest 2 View  Result Date:  07/24/2019 CLINICAL DATA:  Chest pain EXAM: CHEST - 2 VIEW COMPARISON:  02/21/2019 FINDINGS: Lungs are hyperexpanded. The lungs are clear without focal pneumonia, edema, pneumothorax or pleural effusion. Cardiopericardial silhouette is at upper limits of normal for size. Permanent pacer/AICD again noted. Bones are diffusely demineralized. IMPRESSION: Stable.  No acute findings. Electronically Signed   By: Misty Stanley M.D.   On: 07/24/2019 17:04    Procedures Procedures (including critical care time)  Medications Ordered in ED Medications  sodium chloride flush (NS) 0.9 % injection 3 mL (has no administration in time range)    ED Course  I have reviewed the triage vital signs and the nursing notes.  Pertinent labs & imaging results that were available during my care of the patient were reviewed by me and considered in my medical decision making (see chart for details).    DALIANA LEVERETT was evaluated in Emergency Department on 07/25/2019 for the symptoms described in the history of present illness. He/she was evaluated in the context of the global COVID-19 pandemic, which necessitated consideration that the patient might be at risk for infection with the SARS-CoV-2 virus that causes COVID-19. Institutional protocols and algorithms that pertain to the evaluation of patients at risk for COVID-19 are in a state of rapid change based on information released by regulatory bodies including the CDC and federal and state organizations. These policies and algorithms were followed during the patient's care in the ED.  MDM Rules/Calculators/A&P  Patient presents to the ED with complaints of intermittent near syncope, chest pain, abdominal pain, and mention of left upper extremity weakness.  She is nontoxic, resting comfortably, vitals notable for mildly elevated blood pressure, otherwise within normal limits.  Near syncope seems very positional.  Does seem to be a bit weaker on the left  side with some discoordination with finger-to-nose and the left upper extremity.  Question effort component with weakness as this does improve with encouragement, but this is overall difficult to discern. Sxs > 24 hours, not a code stroke candidate.   Abdomen with some left lower quadrant abdominal tenderness without peritoneal signs.  Additional history obtained:  Additional history obtained from nursing note & chart review. Previous records obtained and reviewed.   Lab Tests:  I Ordered, reviewed, and interpreted labs, which included:  CBC: No significant anemia or leukocytosis. BMP: Baseline renal function.  No significant electrolyte derangement. Troponins: No significant elevation Lipase: WNL Hepatic function panel: fairly unremarkable, mild elevation in AST/tbili.  UA: no UTI.  Imaging Studies ordered:  I ordered imaging studies which included CXR, I independently visualized and interpreted imaging which showed Stable.  No acute findings.  CT A/P also obtained: 1. Lack of formed stool with air and fluid in the rectal vault. Could reflect a diarrheal illness/rapid transit state. 2. Nonobstructing bilateral nephrolithiasis. Increased attenuation of the medullary pyramids could reflect underlying nephrocalcinosis. 3. Asymmetric left renal atrophy. 4. Layering hyperdensity within the gallbladder may reflect biliary stones or sludge. No CT evidence of acute cholecystitis. If there is persisting clinical concern, right upper quadrant ultrasound could be obtained. 5. Small sliding-type hiatal hernia. 6. Cardiomegaly with coronary artery atherosclerosis. 7. Aortic Atherosclerosis   ED Course:  00:12: CONSULT: Discussed case with neurologist Dr. Lorraine Lax in terms of patient's neurologic symptoms, recommend CT angio head/neck and potentially MRI brain without contrast, neurology team will see.  Labs and imaging fairly unremarkable.  In regards to gallbladder findings on CT no focal right upper quadrant  tenderness, negative Murphy's, doubt acute cholecystitis, do not feel that emergent ultrasound is necessary at this time.  Her EKG does not show findings of STEMI, troponins are flat, ACS seems less likely. Not hypoxic or tachycardic to raise concern for pulmonary embolism specifically. Unfortunately patient cannot have CTAs with contrast secondary to her renal function, will obtain MRI/MRAs wo contrast for further assessment with her lightheadedness/dizziness w/ near syncopal episodes, plan for admission for further evaluation/monitoring.   Findings and plan of care discussed with supervising physician Dr. Betsey Holiday who is in agreement.   02:34: CONSULT: Discussed with hospitalist Dr. Tonie Griffith who accepts admission.   Portions of this note were generated with [employer]. Dictation errors may occur despite best attempts at proofreading.  Final Clinical Impression(s) / ED Diagnoses Final diagnoses:  Near syncope  Abdominal pain, unspecified abdominal location  Chest pain, unspecified type    Rx / DC Orders ED Discharge Orders    None       Amaryllis Dyke, PA-C 07/25/19 [id number]    Orpah Greek, MD 07/25/19 [phone]

## 2019-07-24 NOTE — Telephone Encounter (Signed)
Pt c/o medication issue:  1. Name of Medication (Possibly)  isosorbide mononitrate (IMDUR) 30 MG 24 hr tablet   Pt is not sure. Pt just said it is a new medication she hasn't been taking for long   2. How are you currently taking this medication (dosage and times per day)? As directed   3. Are you having a reaction (difficulty breathing--STAT)? Dizziness/ Swimmy headed feeling   4. What is your medication issue? Pt thinks this medication has caused her to have vertigo-like symptoms. She went to Urgent Care and she was put on a secondary medicine but she has not gotten relief yet.   She wanted Dr. Lurline Del advice

## 2019-07-24 NOTE — Telephone Encounter (Signed)
Returned the call to the patient. She stated that she has had a couple "odd" spells recently. One was 3 weeks ago and one was last night. She stated that she feels an odd sensation running down her left side that she cannot describe. She feels dizzy and light headed. She denies shortness of breath but stated that she does feel a tightness in her chest occasionally. She did not have the chest tightness while on the phone.   She went to an urgent care yesterday where she was treated for a UTI and vertigo. She stated that she had another episode last night and that it is only on the left side. She stated that she feels like her body is shutting down on that side. She does not feel like it was cardiac related. She has been advised that it will take a few days for the antibiotics and Meclizine to take effect.   Her blood pressure and heart rate at the Urgent Care was 147/78 and 77. While on the phone it was 154/86 and 81.  She also stated that she is feeling a pressure in her rectum area which she has been advised to follow up with her PCP.   The patient has stated that she feels like something is going on with her that is not cardiac related and would feel better if she was seen in the ED. She will get a friend to take her to [facility] ED.

## 2019-07-25 ENCOUNTER — Encounter (HOSPITAL_COMMUNITY): Payer: Self-pay | Admitting: Family Medicine

## 2019-07-25 ENCOUNTER — Emergency Department (HOSPITAL_COMMUNITY): Payer: Medicare Other

## 2019-07-25 DIAGNOSIS — Z9861 Coronary angioplasty status: Secondary | ICD-10-CM | POA: Diagnosis not present

## 2019-07-25 DIAGNOSIS — I482 Chronic atrial fibrillation, unspecified: Secondary | ICD-10-CM | POA: Diagnosis present

## 2019-07-25 DIAGNOSIS — I2510 Atherosclerotic heart disease of native coronary artery without angina pectoris: Secondary | ICD-10-CM | POA: Diagnosis present

## 2019-07-25 DIAGNOSIS — R55 Syncope and collapse: Secondary | ICD-10-CM

## 2019-07-25 DIAGNOSIS — E1122 Type 2 diabetes mellitus with diabetic chronic kidney disease: Secondary | ICD-10-CM | POA: Diagnosis present

## 2019-07-25 DIAGNOSIS — Z86718 Personal history of other venous thrombosis and embolism: Secondary | ICD-10-CM | POA: Diagnosis not present

## 2019-07-25 DIAGNOSIS — I252 Old myocardial infarction: Secondary | ICD-10-CM | POA: Diagnosis not present

## 2019-07-25 DIAGNOSIS — Z91041 Radiographic dye allergy status: Secondary | ICD-10-CM | POA: Diagnosis not present

## 2019-07-25 DIAGNOSIS — Z7901 Long term (current) use of anticoagulants: Secondary | ICD-10-CM | POA: Diagnosis not present

## 2019-07-25 DIAGNOSIS — Z66 Do not resuscitate: Secondary | ICD-10-CM | POA: Diagnosis present

## 2019-07-25 DIAGNOSIS — E039 Hypothyroidism, unspecified: Secondary | ICD-10-CM | POA: Diagnosis present

## 2019-07-25 DIAGNOSIS — Z91040 Latex allergy status: Secondary | ICD-10-CM | POA: Diagnosis not present

## 2019-07-25 DIAGNOSIS — Z888 Allergy status to other drugs, medicaments and biological substances status: Secondary | ICD-10-CM | POA: Diagnosis not present

## 2019-07-25 DIAGNOSIS — I5022 Chronic systolic (congestive) heart failure: Secondary | ICD-10-CM | POA: Diagnosis present

## 2019-07-25 DIAGNOSIS — I130 Hypertensive heart and chronic kidney disease with heart failure and stage 1 through stage 4 chronic kidney disease, or unspecified chronic kidney disease: Secondary | ICD-10-CM | POA: Diagnosis present

## 2019-07-25 DIAGNOSIS — N1832 Chronic kidney disease, stage 3b: Secondary | ICD-10-CM | POA: Diagnosis present

## 2019-07-25 DIAGNOSIS — Z79890 Hormone replacement therapy (postmenopausal): Secondary | ICD-10-CM | POA: Diagnosis not present

## 2019-07-25 DIAGNOSIS — I480 Paroxysmal atrial fibrillation: Secondary | ICD-10-CM | POA: Diagnosis present

## 2019-07-25 DIAGNOSIS — Z882 Allergy status to sulfonamides status: Secondary | ICD-10-CM | POA: Diagnosis not present

## 2019-07-25 DIAGNOSIS — I951 Orthostatic hypotension: Secondary | ICD-10-CM | POA: Diagnosis present

## 2019-07-25 DIAGNOSIS — E785 Hyperlipidemia, unspecified: Secondary | ICD-10-CM | POA: Diagnosis present

## 2019-07-25 DIAGNOSIS — Z20822 Contact with and (suspected) exposure to covid-19: Secondary | ICD-10-CM | POA: Diagnosis present

## 2019-07-25 DIAGNOSIS — Z95810 Presence of automatic (implantable) cardiac defibrillator: Secondary | ICD-10-CM | POA: Diagnosis not present

## 2019-07-25 DIAGNOSIS — R5381 Other malaise: Secondary | ICD-10-CM | POA: Diagnosis present

## 2019-07-25 DIAGNOSIS — R001 Bradycardia, unspecified: Secondary | ICD-10-CM | POA: Diagnosis present

## 2019-07-25 LAB — BASIC METABOLIC PANEL
Anion gap: 10 (ref 5–15)
BUN: 11 mg/dL (ref 8–23)
CO2: 23 mmol/L (ref 22–32)
Calcium: 9.1 mg/dL (ref 8.9–10.3)
Chloride: 105 mmol/L (ref 98–111)
Creatinine, Ser: 1.33 mg/dL — ABNORMAL HIGH (ref 0.44–1.00)
GFR calc Af Amer: 42 mL/min — ABNORMAL LOW (ref >60)
GFR calc non Af Amer: 36 mL/min — ABNORMAL LOW (ref >60)
Glucose, Bld: 97 mg/dL (ref 70–99)
Potassium: 3.8 mmol/L (ref 3.5–5.1)
Sodium: 138 mmol/L (ref 135–145)

## 2019-07-25 LAB — HEPATIC FUNCTION PANEL
ALT: 24 U/L (ref 0–44)
AST: 42 U/L — ABNORMAL HIGH (ref 15–41)
Albumin: 3.6 g/dL (ref 3.5–5.0)
Alkaline Phosphatase: 72 U/L (ref 38–126)
Bilirubin, Direct: 0.3 mg/dL — ABNORMAL HIGH (ref 0.0–0.2)
Indirect Bilirubin: 0.8 mg/dL (ref 0.3–0.9)
Total Bilirubin: 1.1 mg/dL (ref 0.3–1.2)
Total Protein: 6.6 g/dL (ref 6.5–8.1)

## 2019-07-25 LAB — CBC
HCT: 36.1 % (ref 36.0–46.0)
Hemoglobin: 11.6 g/dL — ABNORMAL LOW (ref 12.0–15.0)
MCH: 30.6 pg (ref 26.0–34.0)
MCHC: 32.1 g/dL (ref 30.0–36.0)
MCV: 95.3 fL (ref 80.0–100.0)
Platelets: 222 10*3/uL (ref 150–400)
RBC: 3.79 MIL/uL — ABNORMAL LOW (ref 3.87–5.11)
RDW: 14.7 % (ref 11.5–15.5)
WBC: 5 10*3/uL (ref 4.0–10.5)
nRBC: 0 % (ref 0.0–0.2)

## 2019-07-25 LAB — URINALYSIS, ROUTINE W REFLEX MICROSCOPIC
Bilirubin Urine: NEGATIVE
Glucose, UA: NEGATIVE mg/dL
Hgb urine dipstick: NEGATIVE
Ketones, ur: NEGATIVE mg/dL
Leukocytes,Ua: NEGATIVE
Nitrite: NEGATIVE
Protein, ur: NEGATIVE mg/dL
Specific Gravity, Urine: 1.005 (ref 1.005–1.030)
pH: 5 (ref 5.0–8.0)

## 2019-07-25 LAB — SARS CORONAVIRUS 2 BY RT PCR (HOSPITAL ORDER, PERFORMED IN ~~LOC~~ HOSPITAL LAB): SARS Coronavirus 2: NEGATIVE

## 2019-07-25 LAB — LIPASE, BLOOD: Lipase: 32 U/L (ref 11–51)

## 2019-07-25 LAB — TSH: TSH: 56.294 u[IU]/mL — ABNORMAL HIGH (ref 0.350–4.500)

## 2019-07-25 MED ORDER — APIXABAN 2.5 MG PO TABS
2.5000 mg | ORAL_TABLET | Freq: Two times a day (BID) | ORAL | Status: DC
  Administered 2019-07-25 – 2019-07-26 (×3): 2.5 mg via ORAL
  Filled 2019-07-25 (×3): qty 1

## 2019-07-25 MED ORDER — SODIUM CHLORIDE 0.9 % IV SOLN: INTRAVENOUS | Status: DC

## 2019-07-25 MED ORDER — LORAZEPAM 2 MG/ML IJ SOLN
0.5000 mg | Freq: Once | INTRAMUSCULAR | Status: AC | PRN
  Administered 2019-07-25: 0.5 mg via INTRAVENOUS
  Filled 2019-07-25: qty 1

## 2019-07-25 MED ORDER — ACETAMINOPHEN 650 MG RE SUPP: 650.0000 mg | Freq: Four times a day (QID) | RECTAL | Status: DC | PRN

## 2019-07-25 MED ORDER — SODIUM CHLORIDE 0.9 % IV SOLN: INTRAVENOUS | Status: AC

## 2019-07-25 MED ORDER — ACETAMINOPHEN 325 MG PO TABS: 650.0000 mg | ORAL_TABLET | Freq: Four times a day (QID) | ORAL | Status: DC | PRN

## 2019-07-25 MED ORDER — LEVOTHYROXINE SODIUM 50 MCG PO TABS
50.0000 ug | ORAL_TABLET | Freq: Every day | ORAL | Status: DC
  Administered 2019-07-25 – 2019-07-26 (×2): 50 ug via ORAL
  Filled 2019-07-25 (×3): qty 1

## 2019-07-25 MED ORDER — PAROXETINE HCL 20 MG PO TABS
20.0000 mg | ORAL_TABLET | Freq: Every day | ORAL | Status: DC
  Administered 2019-07-25 – 2019-07-27 (×3): 20 mg via ORAL
  Filled 2019-07-25 (×3): qty 1

## 2019-07-25 MED ORDER — ROSUVASTATIN CALCIUM 20 MG PO TABS
20.0000 mg | ORAL_TABLET | Freq: Every day | ORAL | Status: DC
  Administered 2019-07-25 – 2019-07-26 (×2): 20 mg via ORAL
  Filled 2019-07-25 (×2): qty 1

## 2019-07-25 MED ORDER — METOPROLOL TARTRATE 25 MG PO TABS
25.0000 mg | ORAL_TABLET | Freq: Two times a day (BID) | ORAL | Status: DC
  Administered 2019-07-25 – 2019-07-26 (×2): 25 mg via ORAL
  Filled 2019-07-25 (×4): qty 1

## 2019-07-25 MED ORDER — AMIODARONE HCL 200 MG PO TABS
200.0000 mg | ORAL_TABLET | Freq: Every day | ORAL | Status: DC
  Administered 2019-07-25 – 2019-07-27 (×3): 200 mg via ORAL
  Filled 2019-07-25 (×3): qty 1

## 2019-07-25 MED ORDER — TORSEMIDE 20 MG PO TABS: 20.0000 mg | ORAL_TABLET | Freq: Every day | ORAL | Status: DC | PRN

## 2019-07-25 MED ORDER — AMLODIPINE BESYLATE 2.5 MG PO TABS
2.5000 mg | ORAL_TABLET | Freq: Every day | ORAL | Status: DC
  Administered 2019-07-25 – 2019-07-26 (×2): 2.5 mg via ORAL
  Filled 2019-07-25 (×2): qty 1

## 2019-07-25 MED ORDER — APIXABAN 2.5 MG PO TABS: 2.5000 mg | ORAL_TABLET | Freq: Every day | ORAL | Status: DC

## 2019-07-25 MED ORDER — ISOSORBIDE MONONITRATE ER 30 MG PO TB24
15.0000 mg | ORAL_TABLET | Freq: Every day | ORAL | Status: DC
  Administered 2019-07-25 – 2019-07-27 (×3): 15 mg via ORAL
  Filled 2019-07-25 (×3): qty 1

## 2019-07-25 NOTE — Progress Notes (Signed)
Remote ICD transmission.   

## 2019-07-25 NOTE — Evaluation (Signed)
Physical Therapy Evaluation Patient Details Name: [name] MRN: [id number] DOB: May 29, 1931 Today's Date: 07/25/2019   History of Present Illness  86 y.o. female with medical history significant of CAD status post PCI, AICD in place, atrial fibrillation on amiodarone and anticoagulated on Eliquis, CKD, diabetes mellitus, hypertension, hyperlipidemia, and prior VTE who presents to the ED with multiple complaints including lightheadedness, chest pain and dizziness when turning to the right and R sided weakness Defibrillator shows increased HR over the last week and a half. Admitted for observation on near syncope 07/24/19  Clinical Impression  PTA pt living alone in single story home with level entry. Pt reports independence in ambulation with SPC in home and RW or Rollator for limited community ambulation. Pt reports difficulty with getting in bathtub last 2 weeks due to dizziness so she has been taking bird baths. Pt family lives nearby and provides iADLs. Pt is currently limited in safe mobility by dizziness with unclear trigger as BP with positional change does not indicate orthostatic response and vertigo screen negative for nystagmus with finger follow or head turns. Pt is min A for bed mobility, transfer and ambulation with RW and HHA. PT recommends HHPT with 24 hour assist to regain strength and balance. Pt is not sure family can provide. If supervision is not available SNF level rehab may be more appropriate. PT will continue to follow acutely.    Follow Up Recommendations Home health PT;Supervision/Assistance - 24 hour    Equipment Recommendations  None recommended by PT (has needed equipment at home)    Recommendations for Other Services OT consult     Precautions / Restrictions Precautions Precautions: Fall Precaution Comments: near syncope Restrictions Weight Bearing Restrictions: No      Mobility  Bed Mobility Overal bed mobility: Needs Assistance Bed Mobility: Supine to  Sit     Supine to sit: Min assist     General bed mobility comments: minA for bringing trunk to upright and for scooting hips to EoB  Transfers Overall transfer level: Needs assistance   Transfers: Sit to/from Stand Sit to Stand: Min assist;Min guard         General transfer comment: minA for initial power up from bed surface, min guard for subsequent sit>stand from [facility]  Ambulation/Gait Ambulation/Gait assistance: Min assist;Mod assist Gait Distance (Feet): 20 Feet Assistive device: Rolling walker (2 wheeled);1 person hand held assist Gait Pattern/deviations: Step-through pattern;Decreased step length - right;Decreased step length - left;Shuffle;Trunk flexed Gait velocity: slowed Gait velocity interpretation: <1.31 ft/sec, indicative of household ambulator General Gait Details: min A for steadying with RW, vc for proximity and upright posture, slight waddling due to lack of R knee ROM, modA for steadying with bout of dizziness when turning to her R at the door, pt with reports of needing to use bathroom, able to ambulate back to bed while BSC placed, min A with HHA and support on foot of bed to walk around bed to recliner.         Balance Overall balance assessment: Needs assistance Sitting-balance support: No upper extremity supported;Bilateral upper extremity supported;Single extremity supported;Feet supported;Feet unsupported Sitting balance-Leahy Scale: Poor     Standing balance support: Bilateral upper extremity supported;Single extremity supported;During functional activity Standing balance-Leahy Scale: Poor                               Pertinent Vitals/Pain Pain Assessment: Faces Faces Pain Scale: Hurts little more Pain Location:  R knee and low back  Pain Descriptors / Indicators: Grimacing;Guarding;Sore Pain Intervention(s): Limited activity within patient's tolerance;Monitored during session;Repositioned    Home Living Family/patient expects to  be discharged to:: Private residence Living Arrangements: Alone Available Help at Discharge: Family;Available PRN/intermittently Type of Home: House Home Access: Level entry     Home Layout: One level Home Equipment: Walker - 2 wheels;Walker - 4 wheels;Cane - single point;Tub bench;Grab bars - tub/shower;Hand held shower head      Prior Function Level of Independence: Independent with assistive device(s)         Comments: household ambulation with SPC, RW or Rollator for ambulation in public     Hand Dominance   Dominant Hand: Right    Extremity/Trunk Assessment   Upper Extremity Assessment Upper Extremity Assessment: LUE deficits/detail LUE Deficits / Details: L UE weakness since ICD placement years ago    Lower Extremity Assessment Lower Extremity Assessment: RLE deficits/detail;LLE deficits/detail RLE Deficits / Details: R knee lacks full extension due to OA, not suitable candidate for TKA, grossly assessed hip flex 3+/5, knee ext/flex 3/5, ankle 3+/5,  RLE Coordination: decreased fine motor LLE Deficits / Details: ROM WFL, strength grossly assessed at 4/5 LLE Sensation: decreased light touch;history of peripheral neuropathy LLE Coordination: decreased fine motor    Cervical / Trunk Assessment Cervical / Trunk Assessment: Kyphotic  Communication   Communication: HOH  Cognition Arousal/Alertness: Awake/alert Behavior During Therapy: WFL for tasks assessed/performed;Flat affect Overall Cognitive Status: Within Functional Limits for tasks assessed                                 General Comments: pt report depression last two weeks from not being able to function like she normally does      General Comments General comments (skin integrity, edema, etc.): pt not found to be orthostatic, or have overt evidence of vertigo, edema in bilateral LE with pitting of socks,         Assessment/Plan    PT Assessment Patient needs continued PT services   PT Problem List Decreased strength;Decreased range of motion;Decreased activity tolerance;Decreased balance;Decreased mobility;Decreased coordination;Decreased cognition;Decreased knowledge of use of DME;Cardiopulmonary status limiting activity;Impaired sensation;Decreased safety awareness       PT Treatment Interventions DME instruction;Gait training;Functional mobility training;Therapeutic activities;Therapeutic exercise;Balance training;Cognitive remediation;Patient/family education    PT Goals (Current goals can be found in the Care Plan section)  Acute Rehab PT Goals Patient Stated Goal: feel better and go home PT Goal Formulation: With patient Time For Goal Achievement: 08/08/19 Potential to Achieve Goals: Fair    Frequency Min 3X/week    AM-PAC PT "6 Clicks" Mobility  Outcome Measure Help needed turning from your back to your side while in a flat bed without using bedrails?: None Help needed moving from lying on your back to sitting on the side of a flat bed without using bedrails?: A Little Help needed moving to and from a bed to a chair (including a wheelchair)?: A Little Help needed standing up from a chair using your arms (e.g., wheelchair or bedside chair)?: A Little Help needed to walk in hospital room?: A Little Help needed climbing 3-5 steps with a railing? : A Lot 6 Click Score: 18    End of Session Equipment Utilized During Treatment: Gait belt Activity Tolerance: Patient tolerated treatment well Patient left: in chair;with call bell/phone within reach;with chair alarm set;Other (comment) (MD in room ) Nurse Communication: Mobility  status PT Visit Diagnosis: Unsteadiness on feet (R26.81);Other abnormalities of gait and mobility (R26.89);Muscle weakness (generalized) (M62.81);Difficulty in walking, not elsewhere classified (R26.2);Pain;Dizziness and giddiness (R42);Other symptoms and signs involving the nervous system (R29.898) Pain - Right/Left: Right Pain - part  of body: Knee    Time: 4132-4401 PT Time Calculation (min) (ACUTE ONLY): 33 min   Charges:   PT Evaluation $PT Eval Moderate Complexity: 1 Mod PT Treatments $Therapeutic Activity: 8-22 mins        Arnoldo Hildreth B. Migdalia Dk PT, DPT Acute Rehabilitation Services Pager [phone] Office [phone]   [place] 07/25/2019, 8:58 AM

## 2019-07-25 NOTE — Progress Notes (Signed)
Informed of request MRI for today.   CXR and previous notes reviewed.  Pt has multiple abandoned ICD leads and is not a candidate for MRI.   Shirley Friar, PA-C  07/25/2019 1:13 PM

## 2019-07-25 NOTE — Progress Notes (Signed)
Patient came back from MRI just as soon as I sent her. I called back and spoke with Ashlyn, MRI tech and she stated that the MRI wasn't done because the rep from [employer] stated she had a detached lead. I called Dr. Nevada Crane to make her aware and she was going to have Cardiology come and see patient

## 2019-07-25 NOTE — Progress Notes (Signed)
[name] is a 86 y.o. female with medical history significant of CAD status post PCI, AICD in place, atrial fibrillation on amiodarone and anticoagulated on Eliquis,CKD, diabetes mellitus,hypertension, hyperlipidemia, and prior VTE who presents to the ED with multiple complaints today. Patient primarily is concerned regarding intermittent lightheadedness over the past few days. Patient states that whenever she turns her head to the left, looks up, or tries to get up and move she feels as if she is going to pass out with associated chest pressure at times. She states she has been laying on her right side and avoiding turning her head to the left or laying on left side. She feels dizzy and light headed when she had the last episode on 07/23/19 when she was trying to get out of bed and laid back down on her bed. No true syncopal episode has occurred. Reports she has had similar intermittent episodes of these symptoms over the past few weeks. If she stays still the symptoms get better. She states she has not had nausea or vomiting with the episodes. She did have chest pressure once or twice but no pain she states. She has also had a few days of LLQ abdominal discomfort and loose stools.She was seen for her symptoms at an urgent care and was provided with meclizine which she states she has tried without relief. She was also given Flagyl for what she thinks was a UTI. She also relays she has felt generally fatigued with L sided weakness for a few weeks now.She denies fever, visual disturbance, fever, vomiting, melena, dyspnea, or unilateral leg pain/swelling.  She does have a implanted defibrillator. She reports it was placed in 1992 after she had a MI. She states she last had the defibrillator changed in 2002. She reports she has been shocked 22 times over the past 20 years but none recently. She reports that when she has had these episodes of lightheadedness and feeling weak she did not have a ICD  discharge. She is widowed and lives alone. She denies tobacco, alcohol, illicit drug use.  07/25/19: Seen and examined at her bedside.  Reports persistent weakness and lightheadedness with ambulation.  Work-up in place for presyncope.  Unable to obtain an MRI due to missing AICD leads.  Cardiology consulted to further assess.  Please refer to H&P dictated by my partner Dr. Tonie Griffith on 07/25/2019 for further details of the assessment and plan.

## 2019-07-25 NOTE — H&P (Signed)
History and Physical    [name] VPX:[id number] DOB: Oct 28, 1931 DOA: 07/24/2019  PCP: [name], DO  Patient coming from: Home    Chief Complaint: Near syncope, dizziness  HPI: [name] is a 86 y.o. female with medical history significant of CAD status post PCI, AICD in place, atrial fibrillation on amiodarone and anticoagulated on Eliquis, CKD, diabetes mellitus, hypertension, hyperlipidemia, and prior VTE who presents to the ED with multiple complaints today.  Patient primarily is concerned regarding intermittent lightheadedness over the past few days. Patient states that whenever she turns her head to the left, looks up, or tries to get up and move she feels as if she is going to pass out with associated chest pressure at times. She states she has been laying on her right side and avoiding turning her head to the left or laying on left side. She feels dizzy and light headed when she had the last episode on 07/23/19 when she was trying to get out of bed and laid back down on her bed. No true syncopal episode has occurred. Reports she has had similar intermittent episodes of these symptoms over the past few weeks. If she stays still the symptoms get better. She states she has not had nausea or vomiting with the episodes. She did have chest pressure once or twice but no pain she states. She has also had a few days of LLQ abdominal discomfort and loose stools.  She was seen for her symptoms at an urgent care and was provided with meclizine which she states she has tried without relief.  She was also given Flagyl for what she thinks was a UTI.  She also relays she has felt generally fatigued with L sided weakness for a few weeks now.  She denies fever, visual disturbance, fever, vomiting, melena, dyspnea, or unilateral leg pain/swelling.  She does have a implanted defibrillator. She reports it was placed in 1992 after she had a MI. She states she last had the defibrillator changed in  2002. She reports she has been shocked 22 times over the past 20 years but none recently. She reports that when she has had these episodes of lightheadedness and feeling weak she did not have a ICD discharge. She is widowed and lives alone. She denies tobacco, alcohol, illicit drug use.  ED Course:   Emergency room patient had CT scan of her abdomen and pelvis with no acute pathology identified. Lab work was unremarkable. ER provider had cardiology review patient's defibrillator which showed an increase in her heart rate over the last week and a half but currently is in sinus rhythm.  ER provider discussed with neurology, Dr. Lorraine Lax  who recommended further work-up and neurology will see patient in the morning.  Review of Systems:  General: Reports weakness, Denies fever, chills, weight loss, night sweats.  reports dizziness.  Denies change in appetite HENT: Denies head trauma, headache, denies change in hearing, tinnitus.  Denies nasal congestion or bleeding.  Denies sore throat, sores in mouth.  Denies difficulty swallowing Eyes: Denies blurry vision, pain in eye, drainage.  Denies discoloration of eyes. Neck: Denies pain.  Denies swelling.  Denies pain with movement. Cardiovascular: Reports intermittent chest pressure. Denies palpitations.  Denies edema.  Denies orthopnea Respiratory: Denies shortness of breath, cough.  Denies wheezing.  Denies sputum production Gastrointestinal: Reports LLQ abdominal pain this am. Denies swelling.  Denies nausea, vomiting. Reports diarrhea over past few days.  Denies melena.  Denies hematemesis. Musculoskeletal: Denies  limitation of movement. Reports feeling like her left leg and arm are weaker than her right. Denies deformity or swelling.  Denies pain. Denies arthralgias or myalgias. Genitourinary: Denies pelvic pain.  Denies urinary frequency or hesitancy.  Denies dysuria.  Skin: Denies rash.  Denies petechiae, purpura, ecchymosis. Neurological: Denies  headache.  Denies syncope.  Denies seizure activity.  Denies weakness or paresthesia.  Slurred speech, drooping face.  Denies visual change. Psychiatric: Denies depression, anxiety.  Denies suicidal thoughts or ideation.  Denies hallucinations.  Past Medical History:  Diagnosis Date  . AICD (automatic cardioverter/defibrillator) present   . Allergy    SESONAL  . Anxiety   . ARTHRITIS   . Arthritis   . Atrial fibrillation ([place])   . CAD (coronary artery disease)    a. history of cardiac arrest 1993;  b. s/p LAD/LCX stenting in 2003;  c. 07/2011 Cath: LM nl, LAD patent stent, D1 80ost, LCX patent stent, RCA min irregs;  d. 04/2012 MV: EF 66%, no ishcemia;  e. 12/2013 Echo: Ef 55%, no rwma, Gr 1 DD, triv AI/MR, mildly dil LA;  f. 12/2013 Lexi MV: intermediate risk - apical ischemia and inf/infsept fixed defect, ? artifactual.  . CAD in native artery 12/31/2013   Previous stents to LAD and Ramus patent on cath today 12/31/13 also There is severe disease in the ostial first diagonal which is unchanged from most recent cardiac catheterization. The right coronary artery could not be engaged selectively but nonselective angiography showed no significant disease in the proximal and midsegment.     . Cataract    DENIES  . Chronic back pain   . Chronic sinus bradycardia   . CKD (chronic kidney disease), stage III   . Clotting disorder (HCC)    DVT  . COLITIS 12/02/2007   Qualifier: Diagnosis of  By: Nils Pyle CMA ([place]), [name]    . Diabetes mellitus without complication ([place])    DENIES  . Diverticulosis of colon (without mention of hemorrhage) 2007   Colonoscopy   . Esophageal stricture    a. 2012 s/p dil.  . Esophagitis, unspecified    a. 2012 EGD  . EXTERNAL HEMORRHOIDS   . GERD (gastroesophageal reflux disease)    omeprazole  . H/O hiatal hernia   . Hiatal hernia    a. 2012 EGD.  Marland Kitchen History of DVT in the past, not on Coumadin now    left  leg  . HYPERCHOLESTEROLEMIA   . Hypertension   .  ICD (implantable cardiac defibrillator) in place    a. s/p initial ICD in 1993 in setting of cardiac arrest;  b. 01/2007 gen change: Guidant T135 Vitality DS VR single lead ICD.  . Macular degeneration    gets injecton in eye every 5 weeks- last injection - 05/03/2013   . Myocardial infarction ([place]) 1993  . Near syncope 05/22/2015  . Nephrolithiasis, just saw Dr [name]   . Orthostatic hypotension   . Peripheral vascular disease ([place])    ???  . RECTAL BLEEDING 12/03/2007   Qualifier: Diagnosis of  By: Sharlett Iles MD Byrd Hesselbach   . Unspecified gastritis and gastroduodenitis without mention of hemorrhage    a. 2003 EGD->not noted on 2012 EGD.  Marland Kitchen Unstable angina, neg MI, cath stable.maybe GI 12/30/2013    Past Surgical History:  Procedure Laterality Date  . BREAST BIOPSY Left 11/2013  . CARDIAC CATHETERIZATION    . CARDIAC CATHETERIZATION  01/01/15   patent stents -there is severe disease in ostial  1st diag but without change.   Marland Kitchen CARDIAC DEFIBRILLATOR PLACEMENT  02/05/2007   Guidant  . CARDIOVASCULAR STRESS TEST  12/30/2013   abnormal  . CARDIOVERSION N/A 02/13/2019   Procedure: CARDIOVERSION;  Surgeon: Sanda Klein, MD;  Location: [place];  Service: Cardiovascular;  Laterality: N/A;  . CATARACT EXTRACTION Right    Dr. Katy Fitch  . COLONOSCOPY    . coronary stents     . CYSTOSCOPY WITH URETEROSCOPY Right 05/20/2013   Procedure: CYSTOSCOPY, RIGHT URETEROSCOPY STONE EXTRACTION, Insertion of right DOUBLE J STENT ;  Surgeon: Irine Seal, MD;  Location: [facility];  Service: Urology;  Laterality: Right;  . CYSTOSCOPY WITH URETEROSCOPY AND STENT PLACEMENT N/A 06/03/2013   Procedure: SECOND LOOK CYSTOSCOPY WITH URETEROSCOPY  HOLMIUM LASER LITHO AND STONE EXTRACTION Sammie Bench ;  Surgeon: Malka So, MD;  Location: [facility];  Service: Urology;  Laterality: N/A;  . GIVENS CAPSULE STUDY N/A 10/30/2016   Procedure: GIVENS CAPSULE STUDY;  Surgeon: Irene Shipper, MD;  Location: [place];   Service: Endoscopy;  Laterality: N/A;  NEEDS TO BE ADMITTED FOR OBSERVATION PT. HAS DEFIB  . HEMORROIDECTOMY  80's  . HOLMIUM LASER APPLICATION Right 02/20/4008   Procedure: HOLMIUM LASER APPLICATION;  Surgeon: Irine Seal, MD;  Location: [facility];  Service: Urology;  Laterality: Right;  . HYSTEROSCOPY WITH D & C  09/02/2010   Procedure: DILATATION AND CURETTAGE (D&C) /HYSTEROSCOPY;  Surgeon: Margarette Asal;  Location: [facility];  Service: Gynecology;  Laterality: N/A;  Dilation and Curettage with Hysteroscopy and Polypectomy  . IMPLANTABLE CARDIOVERTER DEFIBRILLATOR (ICD) GENERATOR CHANGE N/A 02/10/2014   Procedure: ICD GENERATOR CHANGE;  Surgeon: Sanda Klein, MD;  Location: [facility];  Service: Cardiovascular;  Laterality: N/A;  . LEFT HEART CATHETERIZATION WITH CORONARY ANGIOGRAM N/A 07/28/2011   Procedure: LEFT HEART CATHETERIZATION WITH CORONARY ANGIOGRAM;  Surgeon: Lorretta Harp, MD;  Location: [facility];  Service: Cardiovascular;  Laterality: N/A;  . LEFT HEART CATHETERIZATION WITH CORONARY ANGIOGRAM N/A 12/31/2013   Procedure: LEFT HEART CATHETERIZATION WITH CORONARY ANGIOGRAM;  Surgeon: Wellington Hampshire, MD;  Location: [place] [facility];  Service: Cardiovascular;  Laterality: N/A;    Social History  reports that she has never smoked. She has never used smokeless tobacco. She reports that she does not drink alcohol and does not use drugs.  Allergies  Allergen Reactions  . Sotalol Other (See Comments)    Torsades   . Bactrim [Sulfamethoxazole-Trimethoprim]     Itchy rash  . Ciprofloxacin Nausea Only  . Actos [Pioglitazone] Swelling  . Adhesive [Tape] Other (See Comments)    Causes sores  . Contrast Media [Iodinated Diagnostic Agents] Other (See Comments)    Headache (no action/pre-med required)  . Latex Rash  . Lipitor [Atorvastatin] Other (See Comments)    myalgia    Family History  Problem Relation Age of Onset  . Stroke Mother   . Other Mother        brain tumor  . Other  Father        MI  . Heart attack Father   . Stroke Sister   . Macular degeneration Sister   . Diabetes Daughter   . Cancer Daughter        ovarian  . Colon polyps Daughter   . Atrial fibrillation Sister   . Hyperlipidemia Sister   . Osteoporosis Sister   . Stroke Sister   . Uterine cancer Sister   . Heart attack Brother   . Heart disease  Brother   . Asthma Brother   . Breast cancer Neg Hx      Prior to Admission medications   Medication Sig Start Date End Date Taking? Authorizing Provider  amiodarone (PACERONE) 200 MG tablet Take 1 tablet (200 mg total) by mouth daily. 04/08/19  Yes Fenton, Clint R, PA  amLODipine (NORVASC) 2.5 MG tablet TAKE 1 TABLET DAILY Patient taking differently: Take 2.5 mg by mouth daily.  05/08/19  Yes Almyra Deforest, PA  apixaban (ELIQUIS) 2.5 MG TABS tablet Take one tablet by mouth twice daily Patient taking differently: Take 2.5 mg by mouth daily.  07/09/19  Yes Fenton, Clint R, PA  Calcium Citrate-Vitamin D (CITRACAL + D PO) Take 1 tablet by mouth daily.    Yes [provider]  carboxymethylcellulose (REFRESH PLUS) 0.5 % SOLN Place 1 drop into both eyes 3 (three) times daily as needed (dry/irritated eyes.).   Yes [provider]  ciprofloxacin (CIPRO) 500 MG tablet Take 500 mg by mouth 2 (two) times daily. 07/22/19  Yes [provider]  diphenoxylate-atropine (LOMOTIL) 2.5-0.025 MG tablet TAKE 1 TABLET EVERY 8 HOURS AS NEEDED FOR DIARRHEA Patient taking differently: Take 1 tablet by mouth daily.  03/25/19  Yes Ronnie Doss M, DO  isosorbide mononitrate (IMDUR) 30 MG 24 hr tablet Take 0.5 tablets (15 mg total) by mouth daily. 02/27/19 07/24/19 Yes Kroeger, Lorelee Cover., PA-C  Levothyroxine Sodium 50 MCG CAPS Take 1 capsule (50 mcg total) by mouth daily before breakfast. 07/10/19  Yes Croitoru, Mihai, MD  loratadine (CLARITIN) 10 MG tablet Take 1 tablet (10 mg total) by mouth daily. (for itching) Patient taking differently: Take 10 mg by  mouth as needed for itching.  01/20/19  Yes Gottschalk, Leatrice Jewels M, DO  meclizine (ANTIVERT) 25 MG tablet Take 25 mg by mouth 3 (three) times daily as needed for dizziness or nausea.  07/22/19  Yes [provider]  metoprolol tartrate (LOPRESSOR) 25 MG tablet Take 25 mg by mouth 2 (two) times daily.   Yes [provider]  metroNIDAZOLE (FLAGYL) 500 MG tablet Take 500 mg by mouth 3 (three) times daily. 07/22/19  Yes [provider]  Multiple Vitamins-Minerals (CENTRUM SILVER ULTRA WOMENS PO) Take 1 tablet by mouth daily.   Yes [provider]  nitroGLYCERIN (NITROSTAT) 0.4 MG SL tablet Place 1 tablet (0.4 mg total) under the tongue every 5 (five) minutes as needed for chest pain. 12/20/18  Yes Claretta Fraise, MD  pantoprazole (PROTONIX) 40 MG tablet Take 1 tablet (40 mg total) by mouth every evening. Patient taking differently: Take 40 mg by mouth daily.  02/21/19  Yes Gottschalk, Ashly M, DO  PARoxetine (PAXIL) 20 MG tablet TAKE (1) TABLET DAILY IN THE MORNING. Patient taking differently: Take 20 mg by mouth daily.  05/19/19  Yes Gottschalk, Leatrice Jewels M, DO  rosuvastatin (CRESTOR) 40 MG tablet Take 0.5 tablets (20 mg total) by mouth daily. Needs to be seen for further refills. 07/09/19  Yes Fenton, Clint R, PA  torsemide (DEMADEX) 20 MG tablet Take 1 tablet (20 mg total) by mouth daily as needed (edema). 04/01/19  Yes Gottschalk, Leatrice Jewels M, DO  Vitamin D, Ergocalciferol, (DRISDOL) 1.25 MG (50000 UNIT) CAPS capsule TAKE 1 CAPSULE ONCE EVERY 7 DAYS Patient taking differently: Take 50,000 Units by mouth every 7 (seven) days.  04/17/19  Yes [name], DO    Physical Exam: Vitals:   07/25/19 0215 07/25/19 0230 07/25/19 0245 07/25/19 0300  BP: 121/74 120/61 (!) 119/56 Marland Kitchen)  148/66  Pulse: 75 74 76 79  Resp: 16 17 (!) 0 13  Temp:      TempSrc:      SpO2: 95% 93% 95% 96%  Weight:      Height:        Constitutional: NAD, calm, comfortable Vitals:   07/25/19 0215 07/25/19  0230 07/25/19 0245 07/25/19 0300  BP: 121/74 120/61 (!) 119/56 (!) 148/66  Pulse: 75 74 76 79  Resp: 16 17 (!) 0 13  Temp:      TempSrc:      SpO2: 95% 93% 95% 96%  Weight:      Height:       General: WDWN, pleasant elderly white female. Alert and oriented x3.  Eyes: EOMI, PERRL, lids and conjunctivae normal.  Sclera nonicteric. No nystagmus HENT:  [place]/AT, external ears normal.  Nares patent without epistasis.  Mucous membranes are moist. Posterior pharynx clear of any exudate or lesions. Normal dentition.  Neck: Soft, normal range of motion, supple, no masses, no thyromegaly.  Trachea midline Respiratory: clear to auscultation bilaterally, no wheezing, no crackles. Normal respiratory effort. No accessory muscle use.  Cardiovascular: Regular rate and rhythm, no murmurs / rubs / gallops. No extremity edema. 2+ pedal pulses. No carotid bruits.  Abdomen: Soft, mild left lower quadrant tenderness to palpation, nondistended, no rebound or guarding.  No masses palpated. No hepatosplenomegaly. Bowel sounds normal Musculoskeletal: FROM. no clubbing / cyanosis. No joint deformity upper and lower extremities. no contractures. Normal muscle tone.  Skin: Warm, dry, intact no rashes, lesions, ulcers. No induration Neurologic: CN 2-12 grossly intact.  Normal speech.  Sensation intact to light touch. patella DTR +1 bilaterally. Strength 3/5 in left upper and lower extremity. Strength 4-5 in right upper and lower extremity. No drift.   Psychiatric: Normal judgment and insight.  Normal mood.    Labs on Admission: I have personally reviewed following labs and imaging studies  CBC: Recent Labs  Lab 07/24/19 1615  WBC 6.6  HGB 12.4  HCT 39.4  MCV 95.4  PLT 762    Basic Metabolic Panel: Recent Labs  Lab 07/24/19 1615  NA 136  K 4.4  CL 102  CO2 23  GLUCOSE 112*  BUN 14  CREATININE 1.54*  CALCIUM 9.2    GFR: Estimated Creatinine Clearance: 22.1 mL/min (A) (by C-G formula based on SCr of  1.54 mg/dL (H)).  Liver Function Tests: Recent Labs  Lab 07/25/19 0052  AST 42*  ALT 24  ALKPHOS 72  BILITOT 1.1  PROT 6.6  ALBUMIN 3.6    Urine analysis:    Component Value Date/Time   COLORURINE YELLOW 07/25/2019 0023   APPEARANCEUR CLEAR 07/25/2019 0023   APPEARANCEUR Clear 05/14/2018 1604   LABSPEC 1.005 07/25/2019 0023   PHURINE 5.0 07/25/2019 0023   GLUCOSEU NEGATIVE 07/25/2019 0023   HGBUR NEGATIVE 07/25/2019 0023   BILIRUBINUR NEGATIVE 07/25/2019 0023   BILIRUBINUR Negative 05/14/2018 1604   KETONESUR NEGATIVE 07/25/2019 0023   PROTEINUR NEGATIVE 07/25/2019 0023   UROBILINOGEN negative 03/15/2015 1352   UROBILINOGEN 0.2 07/25/2011 2213   NITRITE NEGATIVE 07/25/2019 0023   LEUKOCYTESUR NEGATIVE 07/25/2019 0023    Radiological Exams on Admission: CT ABDOMEN PELVIS WO CONTRAST  Result Date: 07/25/2019 CLINICAL DATA:  Abdominal pain EXAM: CT ABDOMEN AND PELVIS WITHOUT CONTRAST TECHNIQUE: Multidetector CT imaging of the abdomen and pelvis was performed following the standard protocol without IV contrast. COMPARISON:  CT 01/11/2019 FINDINGS: Lower chest: A 4 mm nodule in the right  lung base, likely stable from prior accounting for differences and basilar atelectasis. Additional bandlike areas of scarring and atelectatic change in the bilateral lower lobes, right middle lobe and lingula. Increased AP diameter of the chest similar to prior. Cardiomegaly. At least 3 pacer/defibrillator leads are in position towards the cardiac apex. Coronary artery atherosclerosis. No pericardial effusion. Hepatobiliary: Normal liver attenuation with smooth surface contour. No visible lesions on this unenhanced CT. Layering hyperdensity within the gallbladder may reflect biliary stones or sludge. No pericholecystic fluid or inflammation nor biliary ductal dilatation. No intraductal gallstones. Pancreas: Unremarkable. No pancreatic ductal dilatation or surrounding inflammatory changes. Spleen:  Normal in size without focal abnormality. Adrenals/Urinary Tract: Normal adrenal glands.Asymmetric left renal atrophy. Nonobstructing calculi seen in the bilateral collecting systems as well with some increased attenuation of the medullary pyramids could reflect underlying nephrocalcinosis. No obstructive urolithiasis or hydronephrosis. No visible or concerning renal lesion. Physiologic bladder distention without gross bladder abnormality. Stomach/Bowel: Small sliding-type hiatal hernia. Distal stomach and duodenum are unremarkable. No small bowel thickening or dilatation. Cecum displaced slightly towards midline. A normal appendix is visualized. No colonic dilatation or wall thickening. Lack of formed stool with air and fluid in the rectal vault. Vascular/Lymphatic: Atherosclerotic calcifications within the abdominal aorta and branch vessels. No aneurysm or ectasia. No enlarged abdominopelvic lymph nodes. Reproductive: Anteverted uterus. No concerning adnexal lesions. No abdominopelvic free fluid or free gas. No bowel containing hernias. Other: Left anterior abdominal wall battery pack. Asymmetric posterior body wall edema to the left of midline. More diffuse mild body wall edema as well. No bowel containing hernias. No abdominopelvic free fluid or air. Musculoskeletal: Multilevel degenerative changes are present in the imaged portions of the spine. Severe levocurvature of the lumbar spine apex at L2-3, unchanged from prior. Stable superior endplate compression deformity L1. Additional moderate degenerative changes noted throughout the hips and pelvis. No acute or worrisome osseous lesions. IMPRESSION: 1. Lack of formed stool with air and fluid in the rectal vault. Could reflect a diarrheal illness/rapid transit state. 2. Nonobstructing bilateral nephrolithiasis. Increased attenuation of the medullary pyramids could reflect underlying nephrocalcinosis. 3. Asymmetric left renal atrophy. 4. Layering hyperdensity  within the gallbladder may reflect biliary stones or sludge. No CT evidence of acute cholecystitis. If there is persisting clinical concern, right upper quadrant ultrasound could be obtained. 5. Small sliding-type hiatal hernia. 6. Cardiomegaly with coronary artery atherosclerosis. 7. Aortic Atherosclerosis (ICD10-I70.0). Electronically Signed   By: [name] M.D.   On: 07/25/2019 01:01   DG Chest 2 View  Result Date: 07/24/2019 CLINICAL DATA:  Chest pain EXAM: CHEST - 2 VIEW COMPARISON:  02/21/2019 FINDINGS: Lungs are hyperexpanded. The lungs are clear without focal pneumonia, edema, pneumothorax or pleural effusion. Cardiopericardial silhouette is at upper limits of normal for size. Permanent pacer/AICD again noted. Bones are diffusely demineralized. IMPRESSION: Stable.  No acute findings. Electronically Signed   By: Misty Stanley M.D.   On: 07/24/2019 17:04    EKG: Independently reviewed. EKG is reviewed. Normal sinus rhythm with first-degree AV block. Left bundle branch block noted. No acute ST elevation or depression.  Assessment/Plan Principal Problem:   Near syncope Active Problems:   CKD (chronic kidney disease) stage 3, GFR 30-59 ml/min (HCC)   Diabetes mellitus type 2, diet-controlled (HCC)   Essential hypertension   Chronic ischemic heart disease   ICD (implantable cardioverter-defibrillator) in place   Coronary artery disease involving native coronary artery of native heart without angina pectoris   Atrial fibrillation, chronic ([place])  DVT prophylaxis: Patient is anticoagulated on Eliquis which will be continued Code Status:   DNR Family Communication:  Notes and plan discussed with patient. Patient verbalized understanding and agrees with plan. Further recommendations to follow as clinical indicated Disposition Plan:   Patient is from:  Home  Anticipated DC to:  Home  Anticipated DC date:  July 26, 2019  Anticipated DC barriers: No barriers identified   Consults  called:  Neurology Admission status:  Observation  Severity of Illness: The appropriate patient status for this patient is OBSERVATION. Observation status is judged to be reasonable and necessary in order to provide the required intensity of service to ensure the patient's safety. The patient's presenting symptoms, physical exam findings, and initial radiographic and laboratory data in the context of their medical condition is felt to place them at decreased risk for further clinical deterioration. Furthermore, it is anticipated that the patient will be medically stable for discharge from the hospital within 2 midnights of admission. The following factors support the patient status of observation.   " The patient's presenting symptoms include near syncope, chest pressure. " The physical exam findings include generalized weakness with decreased strength in left arm and leg. Has complex cardiac history. " The initial radiographic and laboratory data are unremarkable labs and imaging      Eben Burow MD [facility]  How to contact the [facility] Attending or Consulting provider [place] or covering provider during after hours [place], for this patient?   1. Check the care team in [facility] and look for a) attending/consulting [facility] provider listed and b) the [facility] team listed 2. Log into [email or web address] and use [place]'s universal password to access. If you do not have the password, please contact the hospital operator. 3. Locate the [facility] provider you are looking for under [facility] and page to a number that you can be directly reached. 4. If you still have difficulty reaching the provider, please page the [facility] (Director on Call) for the Hospitalists listed on amion for assistance.  07/25/2019, 3:55 AM

## 2019-07-26 LAB — CBC WITH DIFFERENTIAL/PLATELET
Abs Immature Granulocytes: 0.01 10*3/uL (ref 0.00–0.07)
Basophils Absolute: 0.1 10*3/uL (ref 0.0–0.1)
Basophils Relative: 1 %
Eosinophils Absolute: 0.4 10*3/uL (ref 0.0–0.5)
Eosinophils Relative: 8 %
HCT: 33.8 % — ABNORMAL LOW (ref 36.0–46.0)
Hemoglobin: 11 g/dL — ABNORMAL LOW (ref 12.0–15.0)
Immature Granulocytes: 0 %
Lymphocytes Relative: 31 %
Lymphs Abs: 1.6 10*3/uL (ref 0.7–4.0)
MCH: 31.3 pg (ref 26.0–34.0)
MCHC: 32.5 g/dL (ref 30.0–36.0)
MCV: 96 fL (ref 80.0–100.0)
Monocytes Absolute: 0.7 10*3/uL (ref 0.1–1.0)
Monocytes Relative: 15 %
Neutro Abs: 2.3 10*3/uL (ref 1.7–7.7)
Neutrophils Relative %: 45 %
Platelets: 224 10*3/uL (ref 150–400)
RBC: 3.52 MIL/uL — ABNORMAL LOW (ref 3.87–5.11)
RDW: 14.6 % (ref 11.5–15.5)
WBC: 5.1 10*3/uL (ref 4.0–10.5)
nRBC: 0 % (ref 0.0–0.2)

## 2019-07-26 LAB — COMPREHENSIVE METABOLIC PANEL
ALT: 27 U/L (ref 0–44)
AST: 56 U/L — ABNORMAL HIGH (ref 15–41)
Albumin: 2.9 g/dL — ABNORMAL LOW (ref 3.5–5.0)
Alkaline Phosphatase: 60 U/L (ref 38–126)
Anion gap: 9 (ref 5–15)
BUN: 15 mg/dL (ref 8–23)
CO2: 24 mmol/L (ref 22–32)
Calcium: 8.7 mg/dL — ABNORMAL LOW (ref 8.9–10.3)
Chloride: 107 mmol/L (ref 98–111)
Creatinine, Ser: 1.27 mg/dL — ABNORMAL HIGH (ref 0.44–1.00)
GFR calc Af Amer: 45 mL/min — ABNORMAL LOW (ref >60)
GFR calc non Af Amer: 38 mL/min — ABNORMAL LOW (ref >60)
Glucose, Bld: 122 mg/dL — ABNORMAL HIGH (ref 70–99)
Potassium: 4.1 mmol/L (ref 3.5–5.1)
Sodium: 140 mmol/L (ref 135–145)
Total Bilirubin: 0.7 mg/dL (ref 0.3–1.2)
Total Protein: 5.5 g/dL — ABNORMAL LOW (ref 6.5–8.1)

## 2019-07-26 LAB — PHOSPHORUS: Phosphorus: 3.8 mg/dL (ref 2.5–4.6)

## 2019-07-26 LAB — MAGNESIUM: Magnesium: 1.8 mg/dL (ref 1.7–2.4)

## 2019-07-26 LAB — T4, FREE: Free T4: 0.66 ng/dL (ref 0.61–1.12)

## 2019-07-26 MED ORDER — LEVOTHYROXINE SODIUM 75 MCG PO TABS
75.0000 ug | ORAL_TABLET | Freq: Every day | ORAL | Status: DC
  Administered 2019-07-27: 75 ug via ORAL
  Filled 2019-07-26: qty 1

## 2019-07-26 MED ORDER — APIXABAN 5 MG PO TABS
5.0000 mg | ORAL_TABLET | Freq: Two times a day (BID) | ORAL | Status: DC
  Administered 2019-07-26 – 2019-07-27 (×2): 5 mg via ORAL
  Filled 2019-07-26 (×2): qty 1

## 2019-07-26 NOTE — Evaluation (Addendum)
Occupational Therapy Evaluation Patient Details Name: [name] MRN: [id number] DOB: 09-May-1931 Today's Date: 07/26/2019    History of Present Illness 86 y.o. female with medical history significant of CAD status post PCI, AICD in place, atrial fibrillation on amiodarone and anticoagulated on Eliquis, CKD, diabetes mellitus, hypertension, hyperlipidemia, and prior VTE who presents to the ED with multiple complaints including lightheadedness, chest pain and dizziness when turning to the right and R sided weakness Defibrillator shows increased HR over the last week and a half. Admitted for observation on near syncope 07/24/19   Clinical Impression   PTA Pt living alone with intermittent assist from family for IADL. Pt reports her children live close and offer assistance for medications, housekeeping, and groceries. At time of eval, pt completed sit <> stands with min guard assist and RW. She was able to complete household level of functional mobility without overt LOB or c/o dizziness. She then completed toilet transfer at min guard level with set up anterior/posterior peri care. Pt SBP did drop with initial stand this date (see below/flowsheets). Educated pt on fall prevention safety when this feeling is present. Pt has baseline macular degeneration with limited vision. She is aware of current compensatory strategies, but needs occasional cues to implement these for safety. Given current status, recommend HHOT at d/c for continued progression of BADL activity. Will continue to follow per POC listed below.  Pt sitting up eating lunch: 126/69 Initial stand: 107/65 Standing 3-5 mins: 124/67    Follow Up Recommendations  Home health OT    Equipment Recommendations  None recommended by OT    Recommendations for Other Services       Precautions / Restrictions Precautions Precautions: Fall Precaution Comments: near syncope Restrictions Weight Bearing Restrictions: No      Mobility Bed  Mobility               General bed mobility comments: sitting EOB eating lunch  Transfers Overall transfer level: Needs assistance Equipment used: Rolling walker (2 wheeled) Transfers: Sit to/from Stand Sit to Stand: Min guard         General transfer comment: min guard with increased effort and cues for safe hand placement    Balance Overall balance assessment: Needs assistance Sitting-balance support: Bilateral upper extremity supported;Feet supported       Standing balance support: Bilateral upper extremity supported;Single extremity supported;During functional activity Standing balance-Leahy Scale: Poor Standing balance comment: reliant on external support                           ADL either performed or assessed with clinical judgement   ADL Overall ADL's : Needs assistance/impaired Eating/Feeding: Set up;Sitting   Grooming: Set up;Sitting;Standing Grooming Details (indicate cue type and reason): standing at sink to wash hands Upper Body Bathing: Set up;Sitting   Lower Body Bathing: Sit to/from stand;Sitting/lateral leans;Min guard Lower Body Bathing Details (indicate cue type and reason): does spone bathing seated at sink normally Upper Body Dressing : Set up;Sitting   Lower Body Dressing: Minimal assistance;Sit to/from stand;Sitting/lateral leans   Toilet Transfer: Min guard;RW;Regular Toilet;Grab bars Armed forces technical officer Details (indicate cue type and reason): to toilet in room, use of RW To steady and edge of toilet seat for safe descent Toileting- Clothing Manipulation and Hygiene: Set up;Sitting/lateral lean;Sit to/from stand Toileting - Clothing Manipulation Details (indicate cue type and reason): anterior and posterior peri care Tub/ Shower Transfer: Min guard;Rolling walker   Functional mobility during ADLs:  Min guard;Rolling walker       Vision Baseline Vision/History: Macular Degeneration Patient Visual Report: No change from  baseline Additional Comments: reports only 10% vision in L eye and R eye is still "okay". Uses magnifying glass to read, uses contrasting colors at home     Perception     Praxis      Pertinent Vitals/Pain Pain Assessment: Faces Faces Pain Scale: Hurts little more Pain Location: R knee and low back  Pain Descriptors / Indicators: Grimacing;Guarding;Sore Pain Intervention(s): Limited activity within patient's tolerance;Monitored during session;Repositioned     Hand Dominance Right   Extremity/Trunk Assessment Upper Extremity Assessment Upper Extremity Assessment: LUE deficits/detail LUE Deficits / Details: L UE weakness since ICD placement years ago   Lower Extremity Assessment Lower Extremity Assessment: Defer to PT evaluation       Communication Communication Communication: HOH   Cognition Arousal/Alertness: Awake/alert Behavior During Therapy: WFL for tasks assessed/performed;Flat affect Overall Cognitive Status: Within Functional Limits for tasks assessed                                     General Comments  noted systolic BP to drop (see flowsheets/note) but pt not symptomatic    Exercises     Shoulder Instructions      Home Living Family/patient expects to be discharged to:: Private residence Living Arrangements: Alone Available Help at Discharge: Family;Available PRN/intermittently Type of Home: House Home Access: Level entry     Home Layout: One level     Bathroom Shower/Tub: Teacher, early years/pre: Handicapped height Bathroom Accessibility: Yes How Accessible: Accessible via walker Home Equipment: [place] - 2 wheels;Walker - 4 wheels;Cane - single point;Tub bench;Grab bars - tub/shower;Hand held shower head          Prior Functioning/Environment Level of Independence: Independent with assistive device(s)        Comments: uses cane in home, RW/rollator out in public. Usually takes bird baths. If she gets in the  shower, she will call her daughter so someone is aware. Daughter helps with laundry and house cleaning. Son helps manage meds. Family all lives very close        OT Problem List: Decreased strength;Decreased knowledge of use of DME or AE;Impaired vision/perception;Decreased activity tolerance;Cardiopulmonary status limiting activity;Impaired balance (sitting and/or standing)      OT Treatment/Interventions: Self-care/ADL training;Visual/perceptual remediation/compensation;Therapeutic exercise;Balance training;Energy conservation;Therapeutic activities;DME and/or AE instruction    OT Goals(Current goals can be found in the care plan section) Acute Rehab OT Goals Patient Stated Goal: feel better and go home OT Goal Formulation: With patient Time For Goal Achievement: 08/09/19 Potential to Achieve Goals: Good  OT Frequency: Min 2X/week   Barriers to D/C:            Co-evaluation              AM-PAC OT "6 Clicks" Daily Activity     Outcome Measure Help from another person eating meals?: A Little Help from another person taking care of personal grooming?: A Little Help from another person toileting, which includes using toliet, bedpan, or urinal?: A Little Help from another person bathing (including washing, rinsing, drying)?: A Little Help from another person to put on and taking off regular upper body clothing?: A Little Help from another person to put on and taking off regular lower body clothing?: A Little 6 Click Score: 18   End of Session  Equipment Utilized During Treatment: Administrator, arts Communication: Mobility status  Activity Tolerance: Patient tolerated treatment well Patient left: in chair;with call bell/phone within reach  OT Visit Diagnosis: Unsteadiness on feet (R26.81);Other abnormalities of gait and mobility (R26.89);Muscle weakness (generalized) (M62.81)                Time: 6047-9987 OT Time Calculation (min): 45 min Charges:  OT General  Charges $OT Visit: 1 Visit OT Evaluation $OT Eval Moderate Complexity: 1 Mod OT Treatments $Self Care/Home Management : 23-37 mins  Zenovia Jarred, MSOT, OTR/L Acute Rehabilitation Services [facility] Office Number: [phone] Pager: [phone]  Zenovia Jarred 07/26/2019, 5:41 PM

## 2019-07-26 NOTE — Discharge Instructions (Addendum)
Hypothyroidism  Hypothyroidism is when the thyroid gland does not make enough of certain hormones (it is underactive). The thyroid gland is a small gland located in the lower front part of the neck, just in front of the windpipe (trachea). This gland makes hormones that help control how the body uses food for energy (metabolism) as well as how the heart and brain function. These hormones also play a role in keeping your bones strong. When the thyroid is underactive, it produces too little of the hormones thyroxine (T4) and triiodothyronine (T3). What are the causes? This condition may be caused by:  Hashimoto's disease. This is a disease in which the body's disease-fighting system (immune system) attacks the thyroid gland. This is the most common cause.  Viral infections.  Pregnancy.  Certain medicines.  Birth defects.  Past radiation treatments to the head or neck for cancer.  Past treatment with radioactive iodine.  Past exposure to radiation in the environment.  Past surgical removal of part or all of the thyroid.  Problems with a gland in the center of the brain (pituitary gland).  Lack of enough iodine in the diet. What increases the risk? You are more likely to develop this condition if:  You are female.  You have a family history of thyroid conditions.  You use a medicine called lithium.  You take medicines that affect the immune system (immunosuppressants). What are the signs or symptoms? Symptoms of this condition include:  Feeling as though you have no energy (lethargy).  Not being able to tolerate cold.  Weight gain that is not explained by a change in diet or exercise habits.  Lack of appetite.  Dry skin.  Coarse hair.  Menstrual irregularity.  Slowing of thought processes.  Constipation.  Sadness or depression. How is this diagnosed? This condition may be diagnosed based on:  Your symptoms, your medical history, and a physical exam.  Blood  tests. You may also have imaging tests, such as an ultrasound or MRI. How is this treated? This condition is treated with medicine that replaces the thyroid hormones that your body does not make. After you begin treatment, it may take several weeks for symptoms to go away. Follow these instructions at home:  Take over-the-counter and prescription medicines only as told by your health care provider.  If you start taking any new medicines, tell your health care provider.  Keep all follow-up visits as told by your health care provider. This is important. ? As your condition improves, your dosage of thyroid hormone medicine may change. ? You will need to have blood tests regularly so that your health care provider can monitor your condition. Contact a health care provider if:  Your symptoms do not get better with treatment.  You are taking thyroid replacement medicine and you: ? Sweat a lot. ? Have tremors. ? Feel anxious. ? Lose weight rapidly. ? Cannot tolerate heat. ? Have emotional swings. ? Have diarrhea. ? Feel weak. Get help right away if you have:  Chest pain.  An irregular heartbeat.  A rapid heartbeat.  Difficulty breathing. Summary  Hypothyroidism is when the thyroid gland does not make enough of certain hormones (it is underactive).  When the thyroid is underactive, it produces too little of the hormones thyroxine (T4) and triiodothyronine (T3).  The most common cause is Hashimoto's disease, a disease in which the body's disease-fighting system (immune system) attacks the thyroid gland. The condition can also be caused by viral infections, medicine, pregnancy, or past   radiation treatment to the head or neck.  Symptoms may include weight gain, dry skin, constipation, feeling as though you do not have energy, and not being able to tolerate cold.  This condition is treated with medicine to replace the thyroid hormones that your body does not make. This information  is not intended to replace advice given to you by your health care provider. Make sure you discuss any questions you have with your health care provider. Document Revised: 12/15/2016 Document Reviewed: 12/13/2016 Elsevier Patient Education  2020 [place] on my medicine - ELIQUIS (apixaban)  This medication education was reviewed with me or my healthcare representative as part of my discharge preparation.  The pharmacist that spoke with me during my hospital stay was:  Christy Gentles, [facility]  Why was Eliquis prescribed for you? Eliquis was prescribed for you to reduce the risk of forming blood clots that can cause a stroke if you have a medical condition called atrial fibrillation (a type of irregular heartbeat).  What do You need to know about Eliquis ? Take your Eliquis TWICE DAILY - one 5 mg tablet in the morning and one 5 mg tablet in the evening with or without food.  It would be best to take the doses about the same time each day.  If you have difficulty swallowing the tablet whole please discuss with your pharmacist how to take the medication safely.  Take Eliquis exactly as prescribed by your doctor and DO NOT stop taking Eliquis without talking to the doctor who prescribed the medication.  Stopping may increase your risk of developing a new clot or stroke.  Refill your prescription before you run out.  After discharge, you should have regular check-up appointments with your healthcare provider that is prescribing your Eliquis.  In the future your dose may need to be changed if your kidney function or weight changes by a significant amount or as you get older.  What do you do if you miss a dose? If you miss a dose, take it as soon as you remember on the same day and resume taking twice daily.  Do not take more than one dose of ELIQUIS at the same time.  Important Safety Information A possible side effect of Eliquis is bleeding. You should call your healthcare  provider right away if you experience any of the following: ? Bleeding from an injury or your nose that does not stop. ? Unusual colored urine (red or dark brown) or unusual colored stools (red or black). ? Unusual bruising for unknown reasons. ? A serious fall or if you hit your head (even if there is no bleeding).  Some medicines may interact with Eliquis and might increase your risk of bleeding or clotting while on Eliquis. To help avoid this, consult your healthcare provider or pharmacist prior to using any new prescription or non-prescription medications, including herbals, vitamins, non-steroidal anti-inflammatory drugs (NSAIDs) and supplements.  This website has more information on Eliquis (apixaban): [email or web address].

## 2019-07-26 NOTE — Progress Notes (Addendum)
PROGRESS NOTE  [name] NLZ:[id number] DOB: 1931/10/27 DOA: 07/24/2019 PCP: [name], DO  HPI/Recap of past 24 hours: [name] a 86 y.o.femalewith medical history significantof CAD status post PCI, AICD in place, paroxysmal atrial fibrillation on amiodarone and anticoagulated on Eliquis,CKD 3B, type II diabetes mellitus,hypertension, hyperlipidemia, and prior VTE who presents to the ED with multiple complaints. Patient primarily is concerned regarding intermittent lightheadedness over the past few days. Patient states that whenever she turns her head to the left, looks up, or tries to get up and move she feels as if she is going to pass out with associated chest pressure at times.She states she has been laying on her right side and avoiding turning her head to the left or laying on left side. She feels dizzy and light headed when she had the last episode on 07/23/19 when she was trying to get out of bed and laid back down on her bed.No true syncopal episode has occurred.Reports she has had similar intermittent episodes of these symptoms over the past few weeks. If she stays still the symptoms get better.She states she has not had nausea or vomiting with the episodes. She does have a implanted defibrillator. She reports it was placed in 1992 after she had a MI. She states she last had the defibrillator changed in 2002. She reports she has been shocked 22 times over the past 20 years but none recently. She reports that when she has had these episodes of lightheadedness and feeling weak she did not have a ICD discharge. She is widowed and lives alone. She denies tobacco, alcohol, illicit drug use.   Unable to obtain an MRI due to multiple abandoned ICD leads and not a candidate for MRI.    07/26/2019: Seen and examined at her bedside.  Reports persistent lightheadedness particularly when she looks up.  Lab studies remarkable for TSH greater than 50.  States she was  recently diagnosed with hypothyroidism less than a week ago at urgent care for which she was started on levothyroxine.  Assessment/Plan: Principal Problem:   Near syncope Active Problems:   Chronic ischemic heart disease   ICD (implantable cardioverter-defibrillator) in place   CKD (chronic kidney disease) stage 3, GFR 30-59 ml/min (HCC)   Coronary artery disease involving native coronary artery of native heart without angina pectoris   Diabetes mellitus type 2, diet-controlled ([name])   Essential hypertension   Atrial fibrillation, chronic (HCC)   Near syncope, likely related to her newly diagnosed hypothyroidism with TSH>55 Presented with intermittent lightheadedness Positive orthostatic vital signs and TSH> 55 Unable to obtain MRI due to abandoned ICD leads Continue PT OT and fall precautions, will need to establish care with endocrinology at discharge [facility] to arrange for endocrinology follow up.  Newly diagnosed hypothyroidism Self-reported diagnosed less than a week ago and started on levothyroxine 50 mcg daily TSH 56.2 on 07/25/2019, free T4 0.66 Increase levothyroxine to 75 mcg daily Needs to follow up with endocrinology outpatient  Orthostatic hypotension Positive orthostatic vital signs Received gentle IV fluid hydration IV fluids stopped on 07/26/2019 to avoid volume overload in the setting of chronic systolic CHF.  Chronic systolic CHF Last 2D echo done on 01/21/2019 showed LVEF 50 to 55% with no left ventricular hypertrophy.  She had global hypokinesis of the left ventricle. Euvolemic on exam Continue cardiac medications Continue strict I's and O's and daily weight  Post AICD Has multiple abandoned leads Cardiology has been consulted Will need to follow-up with  cardiology outpatient.  Coronary artery disease status post PCI Continue statin Denies any anginal symptoms at this time  Paroxysmal A. Fib Rate is well controlled on Lopressor 25 mg twice daily On  amiodarone for rhythm control 200 mg daily Continue Eliquis for CVA prevention  Hyperlipidemia Continue Crestor  Physical debility, likely multifactorial PT assessment recommended home health PT Continue fall precautions  DVT prophylaxis:  Eliquis Code Status:  DNR Family Communication: Patient verbalized understanding and agrees with plan. Further recommendations to follow as clinical indicated  Consults called:  Cardiology.    Status is: Inpatient   Dispo:  Patient From: Home  Planned Disposition: Home with Health Care Svc  Expected discharge date: 07/28/19  Medically stable for discharge: No, due to persistent symptomatology.  Will need to arrange for endocrinology follow-up outpatient and likely establish PT outpatient.         Objective: Vitals:   07/25/19 1100 07/25/19 1606 07/25/19 2113 07/26/19 0448  BP: (!) 144/69 110/69 (!) 154/81 126/66  Pulse: (!) 59  (!) 59 (!) 59  Resp: 15 16 16 15   Temp: 97.7 F (36.5 C)  98.9 F (37.2 C) 98.7 F (37.1 C)  TempSrc: Oral  Oral Oral  SpO2: 97% 97% 96% 97%  Weight:    63.3 kg  Height:        Intake/Output Summary (Last 24 hours) at 07/26/2019 1310 Last data filed at 07/26/2019 0400 Gross per 24 hour  Intake 679.67 ml  Output 2 ml  Net 677.67 ml   Filed Weights   07/24/19 1608 07/25/19 0523 07/26/19 0448  Weight: 62.1 kg 62.6 kg 63.3 kg    Exam:  . General: 86 y.o. year-old female well developed well nourished in no acute distress.  Alert and oriented x3. . Cardiovascular: Regular rate and rhythm with no rubs or gallops.  No thyromegaly or JVD noted.   Marland Kitchen Respiratory: Clear to auscultation with no wheezes or rales. Good inspiratory effort. . Abdomen: Soft nontender nondistended with normal bowel sounds x4 quadrants. . Musculoskeletal: No lower extremity edema. 2/4 pulses in all 4 extremities. Marland Kitchen Psychiatry: Mood is appropriate for condition and setting   Data Reviewed: CBC: Recent Labs  Lab  07/24/19 1615 07/25/19 0620 07/26/19 0727  WBC 6.6 5.0 5.1  NEUTROABS  --   --  2.3  HGB 12.4 11.6* 11.0*  HCT 39.4 36.1 33.8*  MCV 95.4 95.3 96.0  PLT 250 222 151   Basic Metabolic Panel: Recent Labs  Lab 07/24/19 1615 07/25/19 0620 07/26/19 0727  NA 136 138 140  K 4.4 3.8 4.1  CL 102 105 107  CO2 23 23 24   GLUCOSE 112* 97 122*  BUN 14 11 15   CREATININE 1.54* 1.33* 1.27*  CALCIUM 9.2 9.1 8.7*  MG  --   --  1.8  PHOS  --   --  3.8   GFR: Estimated Creatinine Clearance: 29 mL/min (A) (by C-G formula based on SCr of 1.27 mg/dL (H)). Liver Function Tests: Recent Labs  Lab 07/25/19 0052 07/26/19 0727  AST 42* 56*  ALT 24 27  ALKPHOS 72 60  BILITOT 1.1 0.7  PROT 6.6 5.5*  ALBUMIN 3.6 2.9*   Recent Labs  Lab 07/25/19 0052  LIPASE 32   No results for input(s): AMMONIA in the last 168 hours. Coagulation Profile: No results for input(s): INR, PROTIME in the last 168 hours. Cardiac Enzymes: No results for input(s): CKTOTAL, CKMB, CKMBINDEX, TROPONINI in the last 168 hours. BNP (last 3 results)  No results for input(s): PROBNP in the last 8760 hours. HbA1C: No results for input(s): HGBA1C in the last 72 hours. CBG: No results for input(s): GLUCAP in the last 168 hours. Lipid Profile: No results for input(s): CHOL, HDL, LDLCALC, TRIG, CHOLHDL, LDLDIRECT in the last 72 hours. Thyroid Function Tests: Recent Labs    07/25/19 0620 07/26/19 0727  TSH 56.294*  --   FREET4  --  0.66   Anemia Panel: No results for input(s): VITAMINB12, FOLATE, FERRITIN, TIBC, IRON, RETICCTPCT in the last 72 hours. Urine analysis:    Component Value Date/Time   COLORURINE YELLOW 07/25/2019 0023   APPEARANCEUR CLEAR 07/25/2019 0023   APPEARANCEUR Clear 05/14/2018 1604   LABSPEC 1.005 07/25/2019 0023   PHURINE 5.0 07/25/2019 0023   GLUCOSEU NEGATIVE 07/25/2019 0023   HGBUR NEGATIVE 07/25/2019 0023   BILIRUBINUR NEGATIVE 07/25/2019 0023   BILIRUBINUR Negative 05/14/2018 1604    KETONESUR NEGATIVE 07/25/2019 0023   PROTEINUR NEGATIVE 07/25/2019 0023   UROBILINOGEN negative 03/15/2015 1352   UROBILINOGEN 0.2 07/25/2011 2213   NITRITE NEGATIVE 07/25/2019 0023   LEUKOCYTESUR NEGATIVE 07/25/2019 0023   Sepsis Labs: @LABRCNTIP (procalcitonin:4,lacticidven:4)  ) Recent Results (from the past 240 hour(s))  SARS Coronavirus 2 by RT PCR (hospital order, performed in [place] hospital lab) Nasopharyngeal Nasopharyngeal Swab     Status: None   Collection Time: 07/25/19  3:48 AM   Specimen: Nasopharyngeal Swab  Result Value Ref Range Status   SARS Coronavirus 2 NEGATIVE NEGATIVE Final    Comment: (NOTE) SARS-CoV-2 target nucleic acids are NOT DETECTED.  The SARS-CoV-2 RNA is generally detectable in upper and lower respiratory specimens during the acute phase of infection. The lowest concentration of SARS-CoV-2 viral copies this assay can detect is 250 copies / mL. A negative result does not preclude SARS-CoV-2 infection and should not be used as the sole basis for treatment or other patient management decisions.  A negative result may occur with improper specimen collection / handling, submission of specimen other than nasopharyngeal swab, presence of viral mutation(s) within the areas targeted by this assay, and inadequate number of viral copies (<250 copies / mL). A negative result must be combined with clinical observations, patient history, and epidemiological information.  Fact Sheet for Patients:   StrictlyIdeas.no  Fact Sheet for Healthcare Providers: BankingDealers.co.za  This test is not yet approved or  cleared by the [place] FDA and has been authorized for detection and/or diagnosis of SARS-CoV-2 by FDA under an Emergency Use Authorization (EUA).  This EUA will remain in effect (meaning this test can be used) for the duration of the COVID-19 declaration under Section 564(b)(1) of the Act, 21  U.S.C. section 360bbb-3(b)(1), unless the authorization is terminated or revoked sooner.  Performed at [facility], [place] [address], [place], [place] [address]       Studies: No results found.  Scheduled Meds: . amiodarone  200 mg Oral Daily  . amLODipine  2.5 mg Oral Daily  . apixaban  5 mg Oral BID  . isosorbide mononitrate  15 mg Oral Daily  . [START ON 07/27/2019] levothyroxine  75 mcg Oral QAC breakfast  . metoprolol tartrate  25 mg Oral BID  . PARoxetine  20 mg Oral Daily  . rosuvastatin  20 mg Oral q1800  . sodium chloride flush  3 mL Intravenous Once    Continuous Infusions:   LOS: 1 day     Kayleen Memos, MD [facility] Pager [phone]  If 7PM-7AM, please  contact night-coverage [email or web address] Password [email or web address] 07/26/2019, 1:10 PM

## 2019-07-27 LAB — T3, FREE: T3, Free: 2 pg/mL (ref 2.0–4.4)

## 2019-07-27 MED ORDER — METOPROLOL TARTRATE 12.5 MG HALF TABLET: 12.5000 mg | ORAL_TABLET | Freq: Two times a day (BID) | ORAL | Status: DC

## 2019-07-27 MED ORDER — HYDRALAZINE HCL 10 MG PO TABS: 10.0000 mg | ORAL_TABLET | Freq: Three times a day (TID) | ORAL | 0 refills | Status: DC

## 2019-07-27 MED ORDER — LEVOTHYROXINE SODIUM 75 MCG PO TABS: 75.0000 ug | ORAL_TABLET | Freq: Every day | ORAL | 0 refills | Status: DC

## 2019-07-27 MED ORDER — HYDRALAZINE HCL 10 MG PO TABS
10.0000 mg | ORAL_TABLET | Freq: Three times a day (TID) | ORAL | Status: DC
  Administered 2019-07-27 (×2): 10 mg via ORAL
  Filled 2019-07-27 (×2): qty 1

## 2019-07-27 NOTE — Progress Notes (Signed)
Son in law in room to pick up patient. Him and patient are well aware that patient needs 24 hour supervision and they are both agreeable with the patient going home.

## 2019-07-27 NOTE — Progress Notes (Signed)
Discharge teaching complete. Meds, diet, activity, follow up appointments reviewed and all questions answered. Copy of instructions given to patient and prescriptions sent to pharmacy.  

## 2019-07-27 NOTE — TOC Transition Note (Signed)
Transition of Care [facility]) - CM/SW Discharge Note   Patient Details  Name: [name] MRN: [id number] Date of Birth: Apr 24, 1931  [facility] CM/SW Contact:  Claudie Leach, RN 07/27/2019, 10:37 AM   Clinical Narrative:    Patient to d/c home.  Unable to reach family this am, but states her daughter or son will transport her home later today.  Patient agreeable to [facility] PT/OT and has used an agency before, but does not know name of agency.  Patient agreeable to [facility], who accepted referral.   Consult received for Endocrinology referral.  TOC does not place referrals to physician specialists. Patient given a list of [facility]-affiliated endocrinologists. She states she would prefer to see a doctor in the [place] area and will contact her PCP tomorrow to ask if any travel to that office as other specialists do.     Final next level of care: [place] Barriers to Discharge: No Barriers Identified   Patient Goals and CMS Choice Patient states their goals for this hospitalization and ongoing recovery are:: to get home CMS Medicare.gov Compare Post Acute Care list provided to:: Patient Choice offered to / list presented to : Patient   Discharge Plan and Services      HH Arranged: PT, OT [facility] Agency: [place] ([facility]) Date [place]: 07/27/19 Time [place]: [id number] Representative spoke with at [place]: Corene Cornea

## 2019-07-27 NOTE — Care Management (Signed)
Met with patient and son-in-law at bedside to discuss patient care needs.  Patient demonstrated the need for 24 hour supervision with PT/OT on Friday.  Patient states she feels better today than on Friday and feels closer to baseline.  They state she has multiple family members who live within walking distance of her house and other neighbors who visit frequently.  Emphasized to patient that that is not the same as 24 hour supervision and there is concern that she is at risk for falls.  Also advised on the role of [place] and added an aide.  Patient and son state that they plan to hire a family friend to stay with patient during the day while family members are working and they can rotate staying with her at night until she feels better.  Patient states her daughter who lives next door works from home.  CM suggested that patient stay at her house during the day if possible.  Patient and SIL verbalize understanding and patient states she feels safe to go home.

## 2019-07-27 NOTE — Discharge Summary (Signed)
Discharge Summary  [name] RKY:[id number] DOB: 1931-04-10  PCP: [name], DO  Admit date: 07/24/2019 Discharge date: 07/27/2019  Time spent: 35 minutes  Recommendations for Outpatient Follow-up:  1. Follow-up with your PCP within a week 2. Obtain endocrinology referral from your PCP within a week 3. Follow-up with cardiology 4. Take your medications as prescribed 5. Continue fall precautions 6. Continue home PT OT with fall precautions  Discharge Diagnoses:  Active Hospital Problems   Diagnosis Date Noted  . Near syncope 05/22/2015  . Atrial fibrillation, chronic ([place]) 01/09/2019  . Essential hypertension 12/21/2017  . Diabetes mellitus type 2, diet-controlled ([place]) 09/14/2014  . Coronary artery disease involving native coronary artery of native heart without angina pectoris 12/31/2013  . ICD (implantable cardioverter-defibrillator) in place 07/25/2011  . CKD (chronic kidney disease) stage 3, GFR 30-59 ml/min (HCC) 07/25/2011    Class: Chronic  . Chronic ischemic heart disease 12/03/2007    Resolved Hospital Problems  No resolved problems to display.    Discharge Condition: Stable  Diet recommendation: Heart healthy diet  Vitals:   07/26/19 2115 07/26/19 2133  BP:  132/86  Pulse: 71 74  Resp:    Temp:  97.8 F (36.6 C)  SpO2:  97%    History of present illness:   [name] a 86 y.o.femalewith medical history significantof CAD status post PCI, ICD in place, paroxysmal atrial fibrillation on amiodarone and anticoagulated on Eliquis,CKD 3B, type II diabetes mellitus,hypertension, hyperlipidemia, and prior VTE who presents to [facility] ED with multiple complaints. Patient primarily is concerned regarding intermittent lightheadedness over the past few days. She states she has not had nausea or vomiting with the episodes. She does have a implanted defibrillator. She reports it was placed in 1992 after she had a MI. She states she last had the  defibrillator changed in 2002. She reports she has been shocked 22 times over the past 20 years but none recently. She reports that when she has had these episodes of lightheadedness and feeling weak she did not have a ICD discharge. Work-up revealed orthostatic hypotension and uncontrolled newly diagnosed hypothyroidism with TSH of 56.  Unable to obtain an MRI due to multiple abandoned ICD leads and not a candidate for MRI.    Discussed with neurology Dr. Leonel Ramsay on 07/26/2019.  Per neurology, presenting symptoms are likely secondary to uncontrolled newly diagnosed hypothyroidism with TSH of 56 and orthostatic hypotension.  Patient will need to closely follow-up with her primary care provider for endocrinology referral to manage her hypothyroidism.    Per electrophysiology:  07/27/2019: Seen and examined at her bedside.    No acute events overnight.  She has no new complaints this morning.  She is alert and oriented.  Advised to follow-up with her primary care provider within a week and to obtain endocrinology referral from her primary care provider within a week.  Also advised to follow-up with her cardiologist Dr. Sallyanne Kuster within a week posthospitalization.  Patient understands and agrees to plan.       Hospital Course:  Principal Problem:   Near syncope Active Problems:   Chronic ischemic heart disease   ICD (implantable cardioverter-defibrillator) in place   CKD (chronic kidney disease) stage 3, GFR 30-59 ml/min (HCC)   Coronary artery disease involving native coronary artery of native heart without angina pectoris   Diabetes mellitus type 2, diet-controlled (HCC)   Essential hypertension   Atrial fibrillation, chronic (HCC)  Near syncope, likely related to her newly diagnosed  hypothyroidism with TSH 54 Presented with intermittent lightheadedness Positive orthostatic vital signs and TSH> 56 Unable to obtain MRI due to abandoned ICD leads Continue PT OT and follow-up  questions Closely follow-up with your primary care provider for endocrinology referral. Closely follow-up with your cardiologist Dr. Sallyanne Kuster  Newly diagnosed hypothyroidism Self-reported diagnosed less than a week ago at urgent care and started on levothyroxine 50 mcg daily TSH 56.2 on 07/25/2019, free T4 0.66 Increased levothyroxine dose to 75 mcg daily from 50 mcg daily Recommend endocrinology follow-up, obtain referral from your PCP  Orthostatic hypotension Positive orthostatic vital signs Received gentle IV fluid hydration Hold off Norvasc and metoprolol Started on hydralazine 10 mg 3 times daily  Sinus bradycardia likely multifactorial secondary to uncontrolled hypothyroidism and iatrogenic with use of calcium channel blocker and beta-blocker. Hold off Norvasc and metoprolol until you see your cardiologist Started on p.o. hydralazine 10 mg 3 times daily Follow-up with your cardiologist  Chronic systolic CHF Last 2D echo done on 01/21/2019 showed LVEF 40 to 45% with no left ventricular hypertrophy.  She had global hypokinesis of the left ventricle. Euvolemic on exam Follow-up with your cardiologist within a week  Post ICD with multiple abandoned ICD leads Has multiple abandoned leads Cardiology has been consulted; electrophysiology assessed. Will need to follow-up with cardiology outpatient.  Coronary artery disease status post PCI Continue statin Denies any anginal symptoms at the time of this exam. Follow-up with your cardiologist  Paroxysmal A. Fib Metoprolol and Norvasc on hold due to bradycardia On amiodarone for rhythm control 200 mg daily Continue Eliquis for CVA prevention  Hyperlipidemia Continue Crestor  Physical debility, likely multifactorial PT assessment recommended home health PT Continue PT OT with assistance and fall precautions.   Code Status:DNR   Consults called: Cardiology..  Curbsided with neurology Dr. Leonel Ramsay on  07/26/2019.   Discharge Exam: BP 132/86 (BP Location: Right Arm)   Pulse 74   Temp 97.8 F (36.6 C) (Oral)   Resp 16   Ht 5\' 3"  (1.6 m)   Wt 63.3 kg   SpO2 97%   BMI 24.71 kg/m  . General: 86 y.o. year-old female well developed well nourished in no acute distress.  Alert and oriented x3. . Cardiovascular: Regular rate and rhythm with no rubs or gallops.  No thyromegaly or JVD noted.   Marland Kitchen Respiratory: Clear to auscultation with no wheezes or rales. Good inspiratory effort. . Abdomen: Soft nontender nondistended with normal bowel sounds x4 quadrants. . Musculoskeletal: No lower extremity edema. 2/4 pulses in all 4 extremities. Marland Kitchen Psychiatry: Mood is appropriate for condition and setting  Discharge Instructions You were cared for by a hospitalist during your hospital stay. If you have any questions about your discharge medications or the care you received while you were in the hospital after you are discharged, you can call the unit and asked to speak with the hospitalist on call if the hospitalist that took care of you is not available. Once you are discharged, your primary care physician will handle any further medical issues. Please note that NO REFILLS for any discharge medications will be authorized once you are discharged, as it is imperative that you return to your primary care physician (or establish a relationship with a primary care physician if you do not have one) for your aftercare needs so that they can reassess your need for medications and monitor your lab values.   Allergies as of 07/27/2019      Reactions   Sotalol Other (  See Comments)   Torsades   Bactrim [sulfamethoxazole-trimethoprim]    Itchy rash   Ciprofloxacin Nausea Only   Actos [pioglitazone] Swelling   Adhesive [tape] Other (See Comments)   Causes sores   Contrast Media [iodinated Diagnostic Agents] Other (See Comments)   Headache (no action/pre-med required)   Latex Rash   Lipitor [atorvastatin] Other (See  Comments)   myalgia      Medication List    STOP taking these medications   amLODipine 2.5 MG tablet Commonly known as: NORVASC   ciprofloxacin 500 MG tablet Commonly known as: CIPRO   Levothyroxine Sodium 50 MCG Caps Replaced by: levothyroxine 75 MCG tablet   metoprolol tartrate 25 MG tablet Commonly known as: LOPRESSOR   metroNIDAZOLE 500 MG tablet Commonly known as: FLAGYL     TAKE these medications   amiodarone 200 MG tablet Commonly known as: PACERONE Take 1 tablet (200 mg total) by mouth daily.   apixaban 2.5 MG Tabs tablet Commonly known as: ELIQUIS Take one tablet by mouth twice daily What changed:   how much to take  how to take this  when to take this  additional instructions   carboxymethylcellulose 0.5 % Soln Commonly known as: REFRESH PLUS Place 1 drop into both eyes 3 (three) times daily as needed (dry/irritated eyes.).   CENTRUM SILVER ULTRA WOMENS PO Take 1 tablet by mouth daily.   CITRACAL + D PO Take 1 tablet by mouth daily.   diphenoxylate-atropine 2.5-0.025 MG tablet Commonly known as: LOMOTIL TAKE 1 TABLET EVERY 8 HOURS AS NEEDED FOR DIARRHEA What changed: See the new instructions.   hydrALAZINE 10 MG tablet Commonly known as: APRESOLINE Take 1 tablet (10 mg total) by mouth every 8 (eight) hours.   isosorbide mononitrate 30 MG 24 hr tablet Commonly known as: IMDUR Take 0.5 tablets (15 mg total) by mouth daily.   levothyroxine 75 MCG tablet Commonly known as: SYNTHROID Take 1 tablet (75 mcg total) by mouth daily before breakfast. Start taking on: July 28, 2019 Replaces: Levothyroxine Sodium 50 MCG Caps   loratadine 10 MG tablet Commonly known as: CLARITIN Take 1 tablet (10 mg total) by mouth daily. (for itching) What changed:   when to take this  reasons to take this  additional instructions   meclizine 25 MG tablet Commonly known as: ANTIVERT Take 25 mg by mouth 3 (three) times daily as needed for dizziness or  nausea.   nitroGLYCERIN 0.4 MG SL tablet Commonly known as: NITROSTAT Place 1 tablet (0.4 mg total) under the tongue every 5 (five) minutes as needed for chest pain.   pantoprazole 40 MG tablet Commonly known as: PROTONIX Take 1 tablet (40 mg total) by mouth every evening. What changed: when to take this   PARoxetine 20 MG tablet Commonly known as: PAXIL TAKE (1) TABLET DAILY IN THE MORNING. What changed: See the new instructions.   rosuvastatin 40 MG tablet Commonly known as: CRESTOR Take 0.5 tablets (20 mg total) by mouth daily. Needs to be seen for further refills.   torsemide 20 MG tablet Commonly known as: DEMADEX Take 1 tablet (20 mg total) by mouth daily as needed (edema).   Vitamin D (Ergocalciferol) 1.25 MG (50000 UNIT) Caps capsule Commonly known as: DRISDOL TAKE 1 CAPSULE ONCE EVERY 7 DAYS What changed: See the new instructions.      Allergies  Allergen Reactions  . Sotalol Other (See Comments)    Torsades   . Bactrim [Sulfamethoxazole-Trimethoprim]     Itchy rash  .  Ciprofloxacin Nausea Only  . Actos [Pioglitazone] Swelling  . Adhesive [Tape] Other (See Comments)    Causes sores  . Contrast Media [Iodinated Diagnostic Agents] Other (See Comments)    Headache (no action/pre-med required)  . Latex Rash  . Lipitor [Atorvastatin] Other (See Comments)    myalgia    Follow-up Information    Ronnie Doss M, DO. Call in 1 day(s).   Specialty: Family Medicine Why: Please call for a post hospital follow-up appointment. Contact information: [place] [address] [phone]        Sanda Klein, MD .   Specialty: Cardiology Contact information: [address] [place] [address] [phone]        [facility] Follow [facility].   Specialty: Home Health Services Why: [phone].  This agency will send a physical and occupational therapist to your home.  They will contact you to arrange.                 The results of significant diagnostics from this hospitalization (including imaging, microbiology, ancillary and laboratory) are listed below for reference.    Significant Diagnostic Studies: CT ABDOMEN PELVIS WO CONTRAST  Result Date: 07/25/2019 CLINICAL DATA:  Abdominal pain EXAM: CT ABDOMEN AND PELVIS WITHOUT CONTRAST TECHNIQUE: Multidetector CT imaging of the abdomen and pelvis was performed following the standard protocol without IV contrast. COMPARISON:  CT 01/11/2019 FINDINGS: Lower chest: A 4 mm nodule in the right lung base, likely stable from prior accounting for differences and basilar atelectasis. Additional bandlike areas of scarring and atelectatic change in the bilateral lower lobes, right middle lobe and lingula. Increased AP diameter of the chest similar to prior. Cardiomegaly. At least 3 pacer/defibrillator leads are in position towards the cardiac apex. Coronary artery atherosclerosis. No pericardial effusion. Hepatobiliary: Normal liver attenuation with smooth surface contour. No visible lesions on this unenhanced CT. Layering hyperdensity within the gallbladder may reflect biliary stones or sludge. No pericholecystic fluid or inflammation nor biliary ductal dilatation. No intraductal gallstones. Pancreas: Unremarkable. No pancreatic ductal dilatation or surrounding inflammatory changes. Spleen: Normal in size without focal abnormality. Adrenals/Urinary Tract: Normal adrenal glands.Asymmetric left renal atrophy. Nonobstructing calculi seen in the bilateral collecting systems as well with some increased attenuation of the medullary pyramids could reflect underlying nephrocalcinosis. No obstructive urolithiasis or hydronephrosis. No visible or concerning renal lesion. Physiologic bladder distention without gross bladder abnormality. Stomach/Bowel: Small sliding-type hiatal hernia. Distal stomach and duodenum are unremarkable. No small bowel thickening or dilatation. Cecum  displaced slightly towards midline. A normal appendix is visualized. No colonic dilatation or wall thickening. Lack of formed stool with air and fluid in the rectal vault. Vascular/Lymphatic: Atherosclerotic calcifications within the abdominal aorta and branch vessels. No aneurysm or ectasia. No enlarged abdominopelvic lymph nodes. Reproductive: Anteverted uterus. No concerning adnexal lesions. No abdominopelvic free fluid or free gas. No bowel containing hernias. Other: Left anterior abdominal wall battery pack. Asymmetric posterior body wall edema to the left of midline. More diffuse mild body wall edema as well. No bowel containing hernias. No abdominopelvic free fluid or air. Musculoskeletal: Multilevel degenerative changes are present in the imaged portions of the spine. Severe levocurvature of the lumbar spine apex at L2-3, unchanged from prior. Stable superior endplate compression deformity L1. Additional moderate degenerative changes noted throughout the hips and pelvis. No acute or worrisome osseous lesions. IMPRESSION: 1. Lack of formed stool with air and fluid in the rectal vault. Could reflect a diarrheal illness/rapid transit state.  2. Nonobstructing bilateral nephrolithiasis. Increased attenuation of the medullary pyramids could reflect underlying nephrocalcinosis. 3. Asymmetric left renal atrophy. 4. Layering hyperdensity within the gallbladder may reflect biliary stones or sludge. No CT evidence of acute cholecystitis. If there is persisting clinical concern, right upper quadrant ultrasound could be obtained. 5. Small sliding-type hiatal hernia. 6. Cardiomegaly with coronary artery atherosclerosis. 7. Aortic Atherosclerosis (ICD10-I70.0). Electronically Signed   By: [name] M.D.   On: 07/25/2019 01:01   DG Chest 2 View  Result Date: 07/24/2019 CLINICAL DATA:  Chest pain EXAM: CHEST - 2 VIEW COMPARISON:  02/21/2019 FINDINGS: Lungs are hyperexpanded. The lungs are clear without focal pneumonia,  edema, pneumothorax or pleural effusion. Cardiopericardial silhouette is at upper limits of normal for size. Permanent pacer/AICD again noted. Bones are diffusely demineralized. IMPRESSION: Stable.  No acute findings. Electronically Signed   By: Misty Stanley M.D.   On: 07/24/2019 17:04   CUP PACEART REMOTE DEVICE CHECK  Result Date: 07/23/2019 Scheduled remote reviewed. Normal device function.  Next remote 91 days.   Microbiology: Recent Results (from the past 240 hour(s))  SARS Coronavirus 2 by RT PCR (hospital order, performed in [facility] hospital lab) Nasopharyngeal Nasopharyngeal Swab     Status: None   Collection Time: 07/25/19  3:48 AM   Specimen: Nasopharyngeal Swab  Result Value Ref Range Status   SARS Coronavirus 2 NEGATIVE NEGATIVE Final    Comment: (NOTE) SARS-CoV-2 target nucleic acids are NOT DETECTED.  The SARS-CoV-2 RNA is generally detectable in upper and lower respiratory specimens during the acute phase of infection. The lowest concentration of SARS-CoV-2 viral copies this assay can detect is 250 copies / mL. A negative result does not preclude SARS-CoV-2 infection and should not be used as the sole basis for treatment or other patient management decisions.  A negative result may occur with improper specimen collection / handling, submission of specimen other than nasopharyngeal swab, presence of viral mutation(s) within the areas targeted by this assay, and inadequate number of viral copies (<250 copies / mL). A negative result must be combined with clinical observations, patient history, and epidemiological information.  Fact Sheet for Patients:   StrictlyIdeas.no  Fact Sheet for Healthcare Providers: BankingDealers.co.za  This test is not yet approved or  cleared by the [place] FDA and has been authorized for detection and/or diagnosis of SARS-CoV-2 by FDA under an Emergency Use Authorization (EUA).  This  EUA will remain in effect (meaning this test can be used) for the duration of the COVID-19 declaration under Section 564(b)(1) of the Act, 21 U.S.C. section 360bbb-3(b)(1), unless the authorization is terminated or revoked sooner.  Performed at [facility], [place] [address] [place], [place] [address]      Labs: Basic Metabolic Panel: Recent Labs  Lab 07/24/19 1615 07/25/19 0620 07/26/19 0727  NA 136 138 140  K 4.4 3.8 4.1  CL 102 105 107  CO2 23 23 24   GLUCOSE 112* 97 122*  BUN 14 11 15   CREATININE 1.54* 1.33* 1.27*  CALCIUM 9.2 9.1 8.7*  MG  --   --  1.8  PHOS  --   --  3.8   Liver Function Tests: Recent Labs  Lab 07/25/19 0052 07/26/19 0727  AST 42* 56*  ALT 24 27  ALKPHOS 72 60  BILITOT 1.1 0.7  PROT 6.6 5.5*  ALBUMIN 3.6 2.9*   Recent Labs  Lab 07/25/19 0052  LIPASE 32   No results for input(s): AMMONIA in the  last 168 hours. CBC: Recent Labs  Lab 07/24/19 1615 07/25/19 0620 07/26/19 0727  WBC 6.6 5.0 5.1  NEUTROABS  --   --  2.3  HGB 12.4 11.6* 11.0*  HCT 39.4 36.1 33.8*  MCV 95.4 95.3 96.0  PLT 250 222 224   Cardiac Enzymes: No results for input(s): CKTOTAL, CKMB, CKMBINDEX, TROPONINI in the last 168 hours. BNP: BNP (last 3 results) No results for input(s): BNP in the last 8760 hours.  ProBNP (last 3 results) No results for input(s): PROBNP in the last 8760 hours.  CBG: No results for input(s): GLUCAP in the last 168 hours.     Signed:  Kayleen Memos, MD [facility] 07/27/2019, 10:53 AM

## 2019-07-28 ENCOUNTER — Telehealth: Payer: Self-pay | Admitting: *Deleted

## 2019-07-28 NOTE — Telephone Encounter (Signed)
TRANSITIONAL CARE MANAGEMENT TELEPHONE OUTREACH NOTE   Contact Date: 07/28/2019 Contacted By: [name]    DISCHARGE INFORMATION Date of Discharge:07/27/2019 Discharge Facility: [facility] Discharge Diagnosis:Near syncope   Outpatient Follow Up Recommendations (copied from discharge summary) 1. Follow-up with your PCP within a week 2. Obtain endocrinology referral from your PCP within a week 3. Follow-up with cardiology 4. Take your medications as prescribed 5. Continue fall precautions 6. Continue home PT OT with fall precautions  [name] is a female primary care patient of Renee Norlander, DO. An outgoing telephone call was made today and I spoke with patient.  [name] condition(s) and treatment(s) were discussed. An opportunity to ask questions was provided and all were answered or forwarded as appropriate.    ACTIVITIES OF DAILY LIVING  [name] lives alone and she can perform ADLs independently. her primary caregiver is her son and son in law. she is able to depend on her primary caregiver(s) for consistent help. Transportation to appointments, to pick up medications, and to run errands is not a problem.  (Consider referral to [facility] if transportation or a consistent caregiver is a problem)   Fall Risk Fall Risk  04/10/2019 02/26/2019  Falls in the past year? 0 0  Number falls in past yr: 0 -  Injury with Fall? 0 -  Risk for fall due to : History of fall(s);Impaired balance/gait;Impaired mobility;Impaired vision -  Follow up Falls evaluation completed -    high [place] Modifications/Assistive Devices Wheelchair: No Cane: Yes Ramp: No Bedside Toilet: No Hospital Bed:  No Other:    [facility] she is receiving home health Physical therapy and OT services.     MEDICATION RECONCILIATION  [name] has been able to pick-up all prescribed discharge medications from the pharmacy.   A post discharge  medication reconciliation was performed and the complete medication list was reviewed with the patient/caregiver and is current as of 07/28/2019. Changes highlighted below.  Discontinued Medications STOP taking these medications   amLODipine 2.5 MG tablet Commonly known as: NORVASC   ciprofloxacin 500 MG tablet Commonly known as: CIPRO   Levothyroxine Sodium 50 MCG Caps Replaced by: levothyroxine 75 MCG tablet   metoprolol tartrate 25 MG tablet Commonly known as: LOPRESSOR   metroNIDAZOLE 500 MG tablet Commonly known as: FLAGYL     Current Medication List Allergies as of 07/28/2019      Reactions   Sotalol Other (See Comments)   Torsades   Bactrim [sulfamethoxazole-trimethoprim]    Itchy rash   Ciprofloxacin Nausea Only   Actos [pioglitazone] Swelling   Adhesive [tape] Other (See Comments)   Causes sores   Contrast Media [iodinated Diagnostic Agents] Other (See Comments)   Headache (no action/pre-med required)   Latex Rash   Lipitor [atorvastatin] Other (See Comments)   myalgia      Medication List       Accurate as of July 28, 2019  4:21 PM. If you have any questions, ask your nurse or doctor.        amiodarone 200 MG tablet Commonly known as: PACERONE Take 1 tablet (200 mg total) by mouth daily.   apixaban 2.5 MG Tabs tablet Commonly known as: ELIQUIS Take one tablet by mouth twice daily What changed:   how much to take  how to take this  when to take this  additional instructions   carboxymethylcellulose 0.5 % Soln Commonly known as: REFRESH PLUS  Place 1 drop into both eyes 3 (three) times daily as needed (dry/irritated eyes.).   CENTRUM SILVER ULTRA WOMENS PO Take 1 tablet by mouth daily.   CITRACAL + D PO Take 1 tablet by mouth daily.   diphenoxylate-atropine 2.5-0.025 MG tablet Commonly known as: LOMOTIL TAKE 1 TABLET EVERY 8 HOURS AS NEEDED FOR DIARRHEA What changed: See the new instructions.   hydrALAZINE 10 MG tablet Commonly  known as: APRESOLINE Take 1 tablet (10 mg total) by mouth every 8 (eight) hours.   isosorbide mononitrate 30 MG 24 hr tablet Commonly known as: IMDUR Take 0.5 tablets (15 mg total) by mouth daily.   levothyroxine 75 MCG tablet Commonly known as: SYNTHROID Take 1 tablet (75 mcg total) by mouth daily before breakfast.   loratadine 10 MG tablet Commonly known as: CLARITIN Take 1 tablet (10 mg total) by mouth daily. (for itching) What changed:   when to take this  reasons to take this  additional instructions   meclizine 25 MG tablet Commonly known as: ANTIVERT Take 25 mg by mouth 3 (three) times daily as needed for dizziness or nausea.   nitroGLYCERIN 0.4 MG SL tablet Commonly known as: NITROSTAT Place 1 tablet (0.4 mg total) under the tongue every 5 (five) minutes as needed for chest pain.   pantoprazole 40 MG tablet Commonly known as: PROTONIX Take 1 tablet (40 mg total) by mouth every evening. What changed: when to take this   PARoxetine 20 MG tablet Commonly known as: PAXIL TAKE (1) TABLET DAILY IN THE MORNING. What changed: See the new instructions.   rosuvastatin 40 MG tablet Commonly known as: CRESTOR Take 0.5 tablets (20 mg total) by mouth daily. Needs to be seen for further refills.   torsemide 20 MG tablet Commonly known as: DEMADEX Take 1 tablet (20 mg total) by mouth daily as needed (edema).   Vitamin D (Ergocalciferol) 1.25 MG (50000 UNIT) Caps capsule Commonly known as: DRISDOL TAKE 1 CAPSULE ONCE EVERY 7 DAYS What changed: See the new instructions.        PATIENT EDUCATION & FOLLOW-UP PLAN  An appointment for Transitional Care Management is scheduled with [name] on 08/01/2019 at 12:00pm.  Take all medications as prescribed  Contact our office by calling [phone] if you have any questions or concerns.

## 2019-07-30 ENCOUNTER — Other Ambulatory Visit: Payer: Self-pay | Admitting: Family Medicine

## 2019-07-30 ENCOUNTER — Other Ambulatory Visit (HOSPITAL_COMMUNITY): Payer: Self-pay | Admitting: Physician Assistant

## 2019-07-30 DIAGNOSIS — E559 Vitamin D deficiency, unspecified: Secondary | ICD-10-CM

## 2019-08-01 ENCOUNTER — Ambulatory Visit (INDEPENDENT_AMBULATORY_CARE_PROVIDER_SITE_OTHER): Payer: Medicare Other | Admitting: Nurse Practitioner

## 2019-08-01 ENCOUNTER — Encounter: Payer: Self-pay | Admitting: Nurse Practitioner

## 2019-08-01 ENCOUNTER — Other Ambulatory Visit: Payer: Self-pay

## 2019-08-01 VITALS — BP 123/76 | HR 74 | Temp 97.1°F | Resp 18 | Ht 63.0 in | Wt 140.0 lb

## 2019-08-01 DIAGNOSIS — R55 Syncope and collapse: Secondary | ICD-10-CM | POA: Diagnosis not present

## 2019-08-01 DIAGNOSIS — Z09 Encounter for follow-up examination after completed treatment for conditions other than malignant neoplasm: Secondary | ICD-10-CM | POA: Diagnosis not present

## 2019-08-01 DIAGNOSIS — R7989 Other specified abnormal findings of blood chemistry: Secondary | ICD-10-CM | POA: Diagnosis not present

## 2019-08-01 NOTE — Progress Notes (Signed)
New Patient Office Visit  Subjective:  Patient ID: [name], female    DOB: 1931-12-22  Age: 86 y.o. MRN: [id number]  CC:  Chief Complaint  Patient presents with   Transitions Of Care    HPI [name] presents for transition of care management.  Completed all hospital discharge instructions with patient.  Patient verbalized understanding.  Patient will follow up with cardiology.  Endocrinology referral completed.  Medication list reviewed completed and reconciled.  Patient is reporting mild weakness.  Patient is reporting gradual resolution to symptoms.  Patient is not reporting any syncopal episode, headache, or dizziness   Concerning syncope, is not reporting any syncopal signs or symptoms.  Patient reports symptoms have resolved except for mild weakness.  Past Medical History:  Diagnosis Date   AICD (automatic cardioverter/defibrillator) present    Allergy    SESONAL   Anxiety    ARTHRITIS    Arthritis    Atrial fibrillation (HCC)    CAD (coronary artery disease)    a. history of cardiac arrest 1993;  b. s/p LAD/LCX stenting in 2003;  c. 07/2011 Cath: LM nl, LAD patent stent, D1 80ost, LCX patent stent, RCA min irregs;  d. 04/2012 MV: EF 66%, no ishcemia;  e. 12/2013 Echo: Ef 55%, no rwma, Gr 1 DD, triv AI/MR, mildly dil LA;  f. 12/2013 Lexi MV: intermediate risk - apical ischemia and inf/infsept fixed defect, ? artifactual.   CAD in native artery 12/31/2013   Previous stents to LAD and Ramus patent on cath today 12/31/13 also There is severe disease in the ostial first diagonal which is unchanged from most recent cardiac catheterization. The right coronary artery could not be engaged selectively but nonselective angiography showed no significant disease in the proximal and midsegment.      Cataract    DENIES   Chronic back pain    Chronic sinus bradycardia    CKD (chronic kidney disease), stage III    Clotting disorder ([place])    DVT   COLITIS  12/02/2007   Qualifier: Diagnosis of  By: [name], [name]     Diabetes mellitus without complication ([place])    DENIES   Diverticulosis of colon (without mention of hemorrhage) 2007   Colonoscopy    Esophageal stricture    a. 2012 s/p dil.   Esophagitis, unspecified    a. 2012 EGD   EXTERNAL HEMORRHOIDS    GERD (gastroesophageal reflux disease)    omeprazole   H/O hiatal hernia    Hiatal hernia    a. 2012 EGD.   History of DVT in the past, not on Coumadin now    left  leg   HYPERCHOLESTEROLEMIA    Hypertension    ICD (implantable cardiac defibrillator) in place    a. s/p initial ICD in 1993 in setting of cardiac arrest;  b. 01/2007 gen change: Guidant T135 Vitality DS VR single lead ICD.   Macular degeneration    gets injecton in eye every 5 weeks- last injection - 05/03/2013    Myocardial infarction [facility]) 1993   Near syncope 05/22/2015   Nephrolithiasis, just saw Dr [name]    Orthostatic hypotension    Peripheral vascular disease ([place])    ???   RECTAL BLEEDING 12/03/2007   Qualifier: Diagnosis of  By: [name]    Unspecified gastritis and gastroduodenitis without mention of hemorrhage    a. 2003 EGD->not noted on 2012 EGD.   Unstable angina, neg MI,  cath stable.maybe GI 12/30/2013    Past Surgical History:  Procedure Laterality Date   BREAST BIOPSY Left 11/2013   CARDIAC CATHETERIZATION     CARDIAC CATHETERIZATION  01/01/15   patent stents -there is severe disease in ostial 1st diag but without change.    CARDIAC DEFIBRILLATOR PLACEMENT  02/05/2007   Guidant   CARDIOVASCULAR STRESS TEST  12/30/2013   abnormal   CARDIOVERSION N/A 02/13/2019   Procedure: CARDIOVERSION;  Surgeon: Sanda Klein, MD;  Location: [facility];  Service: Cardiovascular;  Laterality: N/A;   CATARACT EXTRACTION Right    Dr. Katy Fitch   COLONOSCOPY     coronary stents      CYSTOSCOPY WITH URETEROSCOPY Right 05/20/2013   Procedure:  CYSTOSCOPY, RIGHT URETEROSCOPY STONE EXTRACTION, Insertion of right DOUBLE J STENT ;  Surgeon: Irine Seal, MD;  Location: [facility];  Service: Urology;  Laterality: Right;   CYSTOSCOPY WITH URETEROSCOPY AND STENT PLACEMENT N/A 06/03/2013   Procedure: SECOND LOOK CYSTOSCOPY WITH URETEROSCOPY  HOLMIUM LASER LITHO AND STONE EXTRACTION Sammie Bench ;  Surgeon: Malka So, MD;  Location: [facility];  Service: Urology;  Laterality: N/A;   GIVENS CAPSULE STUDY N/A 10/30/2016   Procedure: GIVENS CAPSULE STUDY;  Surgeon: Irene Shipper, MD;  Location: [facility] ENDOSCOPY;  Service: Endoscopy;  Laterality: N/A;  NEEDS TO BE ADMITTED FOR OBSERVATION PT. HAS DEFIB   HEMORROIDECTOMY  80's   HOLMIUM LASER APPLICATION Right 02/19/5571   Procedure: HOLMIUM LASER APPLICATION;  Surgeon: Irine Seal, MD;  Location: [facility];  Service: Urology;  Laterality: Right;   HYSTEROSCOPY WITH D & C  09/02/2010   Procedure: DILATATION AND CURETTAGE (D&C) /HYSTEROSCOPY;  Surgeon: Margarette Asal;  Location: [facility];  Service: Gynecology;  Laterality: N/A;  Dilation and Curettage with Hysteroscopy and Polypectomy   IMPLANTABLE CARDIOVERTER DEFIBRILLATOR (ICD) GENERATOR CHANGE N/A 02/10/2014   Procedure: ICD GENERATOR CHANGE;  Surgeon: Sanda Klein, MD;  Location: [facility];  Service: Cardiovascular;  Laterality: N/A;   LEFT HEART CATHETERIZATION WITH CORONARY ANGIOGRAM N/A 07/28/2011   Procedure: LEFT HEART CATHETERIZATION WITH CORONARY ANGIOGRAM;  Surgeon: Lorretta Harp, MD;  Location: [facility] C[facility];  Service: Cardiovascular;  Laterality: N/A;   LEFT HEART CATHETERIZATION WITH CORONARY ANGIOGRAM N/A 12/31/2013   Procedure: LEFT HEART CATHETERIZATION WITH CORONARY ANGIOGRAM;  Surgeon: Wellington Hampshire, MD;  Location: [facility] C[facility] LAB;  Service: Cardiovascular;  Laterality: N/A;    Family History  Problem Relation Age of Onset   Stroke Mother    Other Mother        brain tumor   Other Father        MI   Heart attack Father    Stroke  Sister    Macular degeneration Sister    Diabetes Daughter    Cancer Daughter        ovarian   Colon polyps Daughter    Atrial fibrillation Sister    Hyperlipidemia Sister    Osteoporosis Sister    Stroke Sister    Uterine cancer Sister    Heart attack Brother    Heart disease Brother    Asthma Brother    Breast cancer Neg Hx     Social History   Socioeconomic History   Marital status: Widowed    Spouse name: Not on file   Number of children: 4   Years of education: 8   Highest education level: 8th grade  Occupational History   Occupation: Retired  Tobacco Use   Smoking status:  Never Smoker   Smokeless tobacco: Never Used  Vaping Use   Vaping Use: Never used  Substance and Sexual Activity   Alcohol use: No   Drug use: No   Sexual activity: Never    Birth control/protection: Post-menopausal  Other Topics Concern   Not on file  Social History Narrative   Not on file   Social Determinants of Health   Financial Resource Strain: Low Risk    Difficulty of Paying Living Expenses: Not hard at all  Food Insecurity: No Food Insecurity   Worried About Charity fundraiser in the Last Year: Never true   [place] in the Last Year: Never true  Transportation Needs: No Transportation Needs   Lack of Transportation (Medical): No   Lack of Transportation (Non-Medical): No  Physical Activity: Inactive   Days of Exercise per Week: 0 days   Minutes of Exercise per Session: 0 min  Stress: No Stress Concern Present   Feeling of Stress : Only a little  Social Connections: Moderately Integrated   Frequency of Communication with Friends and Family: More than three times a week   Frequency of Social Gatherings with Friends and Family: Once a week   Attends Religious Services: More than 4 times per year   Active Member of [employer] or Organizations: Yes   Attends Archivist Meetings: More than 4 times per year   Marital Status:  Widowed  Human resources officer Violence: Not At Risk   Fear of Current or Ex-Partner: No   Emotionally Abused: No   Physically Abused: No   Sexually Abused: No  Today's visit was for Transitional Care Management.  The patient was discharged from [facility] on 07/27/2019 with a primary diagnosis of near syncopy  Contact with the patient and/or caregiver, by a clinical staff member, was made on 07/28/2019 and was documented as a telephone encounter within the EMR.  Through chart review and discussion with the patient I have determined that management of their condition is of moderate complexity.      ROS Review of Systems  Constitutional: Negative.   HENT: Negative.   Respiratory: Negative.   Cardiovascular: Negative.   Gastrointestinal: Negative.   Genitourinary: Negative.   Musculoskeletal: Negative.   Skin: Negative.   Neurological: Positive for weakness.  Psychiatric/Behavioral: Negative.     Objective:   Today's Vitals: BP 123/76    Pulse 74    Temp (!) 97.1 F (36.2 C) (Temporal)    Resp 18    Ht 5\' 3"  (1.6 m)    Wt 140 lb (63.5 kg)    SpO2 100%    BMI 24.80 kg/m   Physical Exam Constitutional:      Appearance: Normal appearance.  HENT:     Head: Normocephalic.  Eyes:     Conjunctiva/sclera: Conjunctivae normal.  Cardiovascular:     Rate and Rhythm: Normal rate and regular rhythm.     Pulses: Normal pulses.     Heart sounds: Normal heart sounds.  Pulmonary:     Effort: Pulmonary effort is normal.     Breath sounds: Normal breath sounds.  Abdominal:     General: Bowel sounds are normal.  Musculoskeletal:     Cervical back: Neck supple.     Comments: Weakness.  Skin:    General: Skin is warm.     Findings: No erythema or rash.  Neurological:     Mental Status: She is alert and oriented to person, place, and time.  Psychiatric:        Mood and Affect: Mood normal.        Behavior: Behavior normal.     Assessment & Plan:  Hospital discharge  follow-up Patient is a 86 year old female who presents to clinic today for transition of care management.  Completed the hospital discharge instructions with patient.  Patient verbalized understanding.  Patient will follow up with cardiology, endocrinology referral completed.  Medication list reviewed completed and reconciled.  Patient is reporting mild weakness, patient is reporting gradual resolution to symptoms.  Patient is not reporting any syncopal episode headache or dizziness.  Patient knows to follow-up with worsening or unresolved symptoms.  Near syncope Concerning patient's near syncopal episode, most symptoms are resolved patient reports gradual resolution. Patient knows to follow-up with worsening or unresolved symptoms. Reviewed hospital discharge instructions, reviewed medication list. All referral completed.   Problem List Items Addressed This Visit      Cardiovascular and Mediastinum   Near syncope    Other Visit Diagnoses    Hospital discharge follow-up    -  Primary      Outpatient Encounter Medications as of 08/01/2019  Medication Sig   amiodarone (PACERONE) 200 MG tablet TAKE 1 TABLET DAILY   apixaban (ELIQUIS) 2.5 MG TABS tablet Take one tablet by mouth twice daily (Patient taking differently: Take 2.5 mg by mouth daily. )   Calcium Citrate-Vitamin D (CITRACAL + D PO) Take 1 tablet by mouth daily.    carboxymethylcellulose (REFRESH PLUS) 0.5 % SOLN Place 1 drop into both eyes 3 (three) times daily as needed (dry/irritated eyes.).   diphenoxylate-atropine (LOMOTIL) 2.5-0.025 MG tablet TAKE 1 TABLET EVERY 8 HOURS AS NEEDED FOR DIARRHEA (Patient taking differently: Take 1 tablet by mouth daily. )   hydrALAZINE (APRESOLINE) 10 MG tablet Take 1 tablet (10 mg total) by mouth every 8 (eight) hours.   levothyroxine (SYNTHROID) 75 MCG tablet Take 1 tablet (75 mcg total) by mouth daily before breakfast.   loratadine (CLARITIN) 10 MG tablet Take 1 tablet (10 mg total)  by mouth daily. (for itching) (Patient taking differently: Take 10 mg by mouth as needed for itching. )   meclizine (ANTIVERT) 25 MG tablet Take 25 mg by mouth 3 (three) times daily as needed for dizziness or nausea.    Multiple Vitamins-Minerals (CENTRUM SILVER ULTRA WOMENS PO) Take 1 tablet by mouth daily.   nitroGLYCERIN (NITROSTAT) 0.4 MG SL tablet Place 1 tablet (0.4 mg total) under the tongue every 5 (five) minutes as needed for chest pain.   pantoprazole (PROTONIX) 40 MG tablet Take 1 tablet (40 mg total) by mouth every evening. (Patient taking differently: Take 40 mg by mouth daily. )   PARoxetine (PAXIL) 20 MG tablet TAKE (1) TABLET DAILY IN THE MORNING. (Patient taking differently: Take 20 mg by mouth daily. )   rosuvastatin (CRESTOR) 40 MG tablet Take 0.5 tablets (20 mg total) by mouth daily. Needs to be seen for further refills.   torsemide (DEMADEX) 20 MG tablet Take 1 tablet (20 mg total) by mouth daily as needed (edema).   Vitamin D, Ergocalciferol, (DRISDOL) 1.25 MG (50000 UNIT) CAPS capsule TAKE 1 CAPSULE ONCE EVERY 7 DAYS   isosorbide mononitrate (IMDUR) 30 MG 24 hr tablet Take 0.5 tablets (15 mg total) by mouth daily.   No facility-administered encounter medications on file as of 08/01/2019.    Follow-up: No follow-ups on file.   Ivy Lynn, NP

## 2019-08-01 NOTE — Assessment & Plan Note (Signed)
Concerning patient's near syncopal episode, most symptoms are resolved patient reports gradual resolution. Patient knows to follow-up with worsening or unresolved symptoms. Reviewed hospital discharge instructions, reviewed medication list. All referral completed.

## 2019-08-01 NOTE — Assessment & Plan Note (Signed)
Patient is a 86 year old female who presents to clinic today for transition of care management.  Completed the hospital discharge instructions with patient.  Patient verbalized understanding.  Patient will follow up with cardiology, endocrinology referral completed.  Medication list reviewed completed and reconciled.  Patient is reporting mild weakness, patient is reporting gradual resolution to symptoms.  Patient is not reporting any syncopal episode headache or dizziness.  Patient knows to follow-up with worsening or unresolved symptoms.

## 2019-08-01 NOTE — Patient Instructions (Addendum)
Hospital discharge follow-up Patient is a 86 year old female who presents to clinic today for transition of care management.  Completed the hospital discharge instructions with patient.  Patient verbalized understanding.  Patient will follow up with cardiology, endocrinology referral completed.  Medication list reviewed completed and reconciled.  Patient is reporting mild weakness, patient is reporting gradual resolution to symptoms.  Patient is not reporting any syncopal episode headache or dizziness.  Patient knows to follow-up with worsening or unresolved symptoms.  Near syncope Concerning patient's near syncopal episode, most symptoms are resolved patient reports gradual resolution. Patient knows to follow-up with worsening or unresolved symptoms. Reviewed hospital discharge instructions, reviewed medication list. All referral completed.    Syncope Syncope is when you pass out (faint) for a short time. It is caused by a sudden decrease in blood flow to the brain. Signs that you may be about to pass out include:  Feeling dizzy or light-headed.  Feeling sick to your stomach (nauseous).  Seeing all white or all black.  Having cold, clammy skin. If you pass out, get help right away. Call your local emergency services (911 in the [place] Do not drive yourself to the hospital. Follow these instructions at home: Watch for any changes in your symptoms. Take these actions to stay safe and help with your symptoms: Lifestyle  Do not drive, use machinery, or play sports until your doctor says it is okay.  Do not drink alcohol.  Do not use any products that contain nicotine or tobacco, such as cigarettes and e-cigarettes. If you need help quitting, ask your doctor.  Drink enough fluid to keep your pee (urine) pale yellow. General instructions  Take over-the-counter and prescription medicines only as told by your doctor.  If you are taking blood pressure or heart medicine, sit up and  stand up slowly. Spend a few minutes getting ready to sit and then stand. This can help you feel less dizzy.  Have someone stay with you until you feel stable.  If you start to feel like you might pass out, lie down right away and raise (elevate) your feet above the level of your heart. Breathe deeply and steadily. Wait until all of the symptoms are gone.  Keep all follow-up visits as told by your doctor. This is important. Get help right away if:  You have a very bad headache.  You pass out once or more than once.  You have pain in your chest, belly, or back.  You have a very fast or uneven heartbeat (palpitations).  It hurts to breathe.  You are bleeding from your mouth or your bottom (rectum).  You have black or tarry poop (stool).  You have jerky movements that you cannot control (seizure).  You are confused.  You have trouble walking.  You are very weak.  You have vision problems. These symptoms may be an emergency. Do not wait to see if the symptoms will go away. Get medical help right away. Call your local emergency services (911 in the [place] Do not drive yourself to the hospital. Summary  Syncope is when you pass out (faint) for a short time. It is caused by a sudden decrease in blood flow to the brain.  Signs that you may be about to faint include feeling dizzy, light-headed, or sick to your stomach, seeing all white or all black, or having cold, clammy skin.  If you start to feel like you might pass out, lie down right away and raise (elevate) your feet above  the level of your heart. Breathe deeply and steadily. Wait until all of the symptoms are gone. This information is not intended to replace advice given to you by your health care provider. Make sure you discuss any questions you have with your health care provider. Document Revised: 02/14/2017 Document Reviewed: 02/14/2017 Elsevier Patient Education  2020 [employer].

## 2019-08-04 ENCOUNTER — Other Ambulatory Visit: Payer: Self-pay

## 2019-08-04 ENCOUNTER — Telehealth: Payer: Self-pay

## 2019-08-04 ENCOUNTER — Telehealth: Payer: Self-pay | Admitting: Cardiovascular Disease

## 2019-08-04 NOTE — Telephone Encounter (Signed)
Looks likely to be NSR. Had average HR in 90s for long periods of preceding month, likely AFib with RVR, but back to normal at time of download. Definitely no VT and no shocks. Nothing to do but reassure her.

## 2019-08-04 NOTE — Telephone Encounter (Signed)
Patient line busy, will forward to pharm md to review and give the correct dosage of eliquis

## 2019-08-04 NOTE — Telephone Encounter (Signed)
Pt c/o medication issue:  1. Name of Medication: apixaban (ELIQUIS) 2.5 MG TABS tablet  2. How are you currently taking this medication (dosage and times per day)? 1 tablet by mouth two times daily   3. Are you having a reaction (difficulty breathing--STAT)? No   4. What is your medication issue? [name] states when she was recently hospitalized they had changed her Eliquis dosage, but when she picked up the prescription it was the same as before. She is requesting a nurse call her to verify how she should be taking her Eliquis. Please advise.

## 2019-08-04 NOTE — Telephone Encounter (Signed)
Spoke with pt, advised of MD review.  Provided reassurance and reccommended to continue monitor BP closely.

## 2019-08-04 NOTE — Telephone Encounter (Signed)
Pt reports she was awoken this morning by her ICD alarming and going off.  She indicates she has been shocked before and this felt the same.      Since that time she has felt dizzy, but feels better now.  She states her BP is up but did not provide the result.   Attempted to assist pt with sending manual transmission, had to conference in tech support as transmission not coming through.  Once transmission received and reviewed, called patient back.   Advised pt that she did not receive a shock this morning.    At time of callback pt able top provide this AM BP results 190/80 HR 90.   Requested pt recheck her BP, new result 125/65 HR 53.    Pt confirmed she is taking all medications as ordered.

## 2019-08-04 NOTE — Telephone Encounter (Signed)
Spoke with patient.  She has been on both 2.5 and 5 mg Eliquis doses at various times in the past several years.  When d/c from hospital earlier this month was decreased from 5 to  2.5 mg.  However she had 5 mg tablets at home and had been taking 1 daily.  87 F, 63.6 kg, SCr 1.27 (was as high as 1.71 last month acutely)  Advised patient to continue with her 5 mg tablets (2.5 mg hospital d/c dose never filled).  We will re-evaluate at her next visit with Dr. Loletha Grayer in Sept  Pt voiced understanding.

## 2019-08-15 ENCOUNTER — Other Ambulatory Visit: Payer: Self-pay

## 2019-08-15 ENCOUNTER — Encounter: Payer: Self-pay | Admitting: Family Medicine

## 2019-08-15 ENCOUNTER — Ambulatory Visit (INDEPENDENT_AMBULATORY_CARE_PROVIDER_SITE_OTHER): Payer: Medicare Other | Admitting: Family Medicine

## 2019-08-15 VITALS — BP 134/78 | HR 75 | Temp 96.9°F | Ht 63.0 in | Wt 140.4 lb

## 2019-08-15 DIAGNOSIS — E1169 Type 2 diabetes mellitus with other specified complication: Secondary | ICD-10-CM

## 2019-08-15 DIAGNOSIS — R300 Dysuria: Secondary | ICD-10-CM

## 2019-08-15 DIAGNOSIS — E1121 Type 2 diabetes mellitus with diabetic nephropathy: Secondary | ICD-10-CM

## 2019-08-15 DIAGNOSIS — E785 Hyperlipidemia, unspecified: Secondary | ICD-10-CM

## 2019-08-15 DIAGNOSIS — I480 Paroxysmal atrial fibrillation: Secondary | ICD-10-CM

## 2019-08-15 DIAGNOSIS — I10 Essential (primary) hypertension: Secondary | ICD-10-CM

## 2019-08-15 DIAGNOSIS — K5904 Chronic idiopathic constipation: Secondary | ICD-10-CM

## 2019-08-15 DIAGNOSIS — D649 Anemia, unspecified: Secondary | ICD-10-CM

## 2019-08-15 DIAGNOSIS — E1159 Type 2 diabetes mellitus with other circulatory complications: Secondary | ICD-10-CM

## 2019-08-15 DIAGNOSIS — Z794 Long term (current) use of insulin: Secondary | ICD-10-CM

## 2019-08-15 DIAGNOSIS — E032 Hypothyroidism due to medicaments and other exogenous substances: Secondary | ICD-10-CM

## 2019-08-15 DIAGNOSIS — N1832 Chronic kidney disease, stage 3b: Secondary | ICD-10-CM

## 2019-08-15 LAB — URINALYSIS, COMPLETE
Bilirubin, UA: NEGATIVE
Glucose, UA: NEGATIVE
Ketones, UA: NEGATIVE
Nitrite, UA: NEGATIVE
Specific Gravity, UA: 1.01 (ref 1.005–1.030)
Urobilinogen, Ur: 0.2 mg/dL (ref 0.2–1.0)
pH, UA: 5.5 (ref 5.0–7.5)

## 2019-08-15 LAB — MICROSCOPIC EXAMINATION: Bacteria, UA: NONE SEEN

## 2019-08-15 LAB — BAYER DCA HB A1C WAIVED: HB A1C (BAYER DCA - WAIVED): 6.6 % (ref <7.0)

## 2019-08-15 MED ORDER — LINACLOTIDE 145 MCG PO CAPS: 145.0000 ug | ORAL_CAPSULE | Freq: Every day | ORAL | 0 refills | Status: DC

## 2019-08-15 NOTE — Patient Instructions (Signed)
You had labs performed today.  You will be contacted with the results of the labs once they are available, usually in the next 3 business days for routine lab work.  If you have an active my chart account, they will be released to your MyChart.  If you prefer to have these labs released to you via telephone, please let us know.  If you had a pap smear or biopsy performed, expect to be contacted in about 7-10 days.   Hypothyroidism  Hypothyroidism is when the thyroid gland does not make enough of certain hormones (it is underactive). The thyroid gland is a small gland located in the lower front part of the neck, just in front of the windpipe (trachea). This gland makes hormones that help control how the body uses food for energy (metabolism) as well as how the heart and brain function. These hormones also play a role in keeping your bones strong. When the thyroid is underactive, it produces too little of the hormones thyroxine (T4) and triiodothyronine (T3). What are the causes? This condition may be caused by:  Hashimoto's disease. This is a disease in which the body's disease-fighting system (immune system) attacks the thyroid gland. This is the most common cause.  Viral infections.  Pregnancy.  Certain medicines.  Birth defects.  Past radiation treatments to the head or neck for cancer.  Past treatment with radioactive iodine.  Past exposure to radiation in the environment.  Past surgical removal of part or all of the thyroid.  Problems with a gland in the center of the brain (pituitary gland).  Lack of enough iodine in the diet. What increases the risk? You are more likely to develop this condition if:  You are female.  You have a family history of thyroid conditions.  You use a medicine called lithium.  You take medicines that affect the immune system (immunosuppressants). What are the signs or symptoms? Symptoms of this condition include:  Feeling as though you have  no energy (lethargy).  Not being able to tolerate cold.  Weight gain that is not explained by a change in diet or exercise habits.  Lack of appetite.  Dry skin.  Coarse hair.  Menstrual irregularity.  Slowing of thought processes.  Constipation.  Sadness or depression. How is this diagnosed? This condition may be diagnosed based on:  Your symptoms, your medical history, and a physical exam.  Blood tests. You may also have imaging tests, such as an ultrasound or MRI. How is this treated? This condition is treated with medicine that replaces the thyroid hormones that your body does not make. After you begin treatment, it may take several weeks for symptoms to go away. Follow these instructions at home:  Take over-the-counter and prescription medicines only as told by your health care provider.  If you start taking any new medicines, tell your health care provider.  Keep all follow-up visits as told by your health care provider. This is important. ? As your condition improves, your dosage of thyroid hormone medicine may change. ? You will need to have blood tests regularly so that your health care provider can monitor your condition. Contact a health care provider if:  Your symptoms do not get better with treatment.  You are taking thyroid replacement medicine and you: ? Sweat a lot. ? Have tremors. ? Feel anxious. ? Lose weight rapidly. ? Cannot tolerate heat. ? Have emotional swings. ? Have diarrhea. ? Feel weak. Get help right away if you have:  Chest pain.  An irregular heartbeat.  A rapid heartbeat.  Difficulty breathing. Summary  Hypothyroidism is when the thyroid gland does not make enough of certain hormones (it is underactive).  When the thyroid is underactive, it produces too little of the hormones thyroxine (T4) and triiodothyronine (T3).  The most common cause is Hashimoto's disease, a disease in which the body's disease-fighting system  (immune system) attacks the thyroid gland. The condition can also be caused by viral infections, medicine, pregnancy, or past radiation treatment to the head or neck.  Symptoms may include weight gain, dry skin, constipation, feeling as though you do not have energy, and not being able to tolerate cold.  This condition is treated with medicine to replace the thyroid hormones that your body does not make. This information is not intended to replace advice given to you by your health care provider. Make sure you discuss any questions you have with your health care provider. Document Revised: 12/15/2016 Document Reviewed: 12/13/2016 Elsevier Patient Education  2020 [employer].

## 2019-08-15 NOTE — Progress Notes (Signed)
Subjective: CC: Follow-up diabetes, dysuria, constipation PCP: [name], DO [name] is a 86 y.o. female presenting to clinic today for:  1.  Type 2 diabetes with hyperlipidemia and atrial fibrillation; hypothyroidism, depression Patient is diet controlled.  She is compliant with her Crestor and medications prescribed to her for her atrial fibrillation.  Reports dark stools but no overt hematochezia.  She is constipated and had to use a Fleet enema recently for the constipation.  No hematuria, vaginal bleeding, epistaxis.  She does bruise easily.  She has felt some fluttering in her chest recently.  She continues to take the amiodarone as prescribed but has unfortunately developed a hypothyroidism.  She was started on Synthroid 75 mcg and is compliant with this medication and taking appropriately.  Does not report any change in voice, difficulty swallowing, tremor.  She does report some anxiety and depressive symptoms.  She has only tolerated Paxil.  We previously tried to switch her to something else but she was intolerant of the switch.  She does wonder if we can go up on that dose.  2.  Dysuria Patient reports several month history of intermittent dysuria.  Again no hematochezia area.  She denies any increased frequency but does feel urgency and pelvic pressure.  No fevers, chills, abdominal pain, all odors or vomiting.   ROS: Per HPI  Allergies  Allergen Reactions   Sotalol Other (See Comments)    Torsades    Bactrim [Sulfamethoxazole-Trimethoprim]     Itchy rash   Ciprofloxacin Nausea Only   Actos [Pioglitazone] Swelling   Adhesive [Tape] Other (See Comments)    Causes sores   Contrast Media [Iodinated Diagnostic Agents] Other (See Comments)    Headache (no action/pre-med required)   Latex Rash   Lipitor [Atorvastatin] Other (See Comments)    myalgia   Past Medical History:  Diagnosis Date   AICD (automatic cardioverter/defibrillator) present     Allergy    SESONAL   Anxiety    ARTHRITIS    Arthritis    Atrial fibrillation (HCC)    CAD (coronary artery disease)    a. history of cardiac arrest 1993;  b. s/p LAD/LCX stenting in 2003;  c. 07/2011 Cath: LM nl, LAD patent stent, D1 80ost, LCX patent stent, RCA min irregs;  d. 04/2012 MV: EF 66%, no ishcemia;  e. 12/2013 Echo: Ef 55%, no rwma, Gr 1 DD, triv AI/MR, mildly dil LA;  f. 12/2013 Lexi MV: intermediate risk - apical ischemia and inf/infsept fixed defect, ? artifactual.   CAD in native artery 12/31/2013   Previous stents to LAD and Ramus patent on cath today 12/31/13 also There is severe disease in the ostial first diagonal which is unchanged from most recent cardiac catheterization. The right coronary artery could not be engaged selectively but nonselective angiography showed no significant disease in the proximal and midsegment.      Cataract    DENIES   Chronic back pain    Chronic sinus bradycardia    CKD (chronic kidney disease), stage III    Clotting disorder ([place])    DVT   COLITIS 12/02/2007   Qualifier: Diagnosis of  By: [name], [name]     Diabetes mellitus without complication ([place])    DENIES   Diverticulosis of colon (without mention of hemorrhage) 2007   Colonoscopy    Esophageal stricture    a. 2012 s/p dil.   Esophagitis, unspecified    a. 2012 EGD   EXTERNAL HEMORRHOIDS  GERD (gastroesophageal reflux disease)    omeprazole   H/O hiatal hernia    Hiatal hernia    a. 2012 EGD.   History of DVT in the past, not on Coumadin now    left  leg   HYPERCHOLESTEROLEMIA    Hypertension    ICD (implantable cardiac defibrillator) in place    a. s/p initial ICD in 1993 in setting of cardiac arrest;  b. 01/2007 gen change: Guidant T135 Vitality DS VR single lead ICD.   Macular degeneration    gets injecton in eye every 5 weeks- last injection - 05/03/2013    Myocardial infarction [facility]) 1993   Near syncope 05/22/2015    Nephrolithiasis, just saw Dr [name]    Orthostatic hypotension    Peripheral vascular disease ([place])    ???   RECTAL BLEEDING 12/03/2007   Qualifier: Diagnosis of  By: [name]    Unspecified gastritis and gastroduodenitis without mention of hemorrhage    a. 2003 EGD->not noted on 2012 EGD.   Unstable angina, neg MI, cath stable.maybe GI 12/30/2013    Current Outpatient Medications:    amiodarone (PACERONE) 200 MG tablet, TAKE 1 TABLET DAILY, Disp: 30 tablet, Rfl: 3   apixaban (ELIQUIS) 2.5 MG TABS tablet, Take one tablet by mouth twice daily (Patient taking differently: Take 2.5 mg by mouth daily. ), Disp: , Rfl:    Calcium Citrate-Vitamin D (CITRACAL + D PO), Take 1 tablet by mouth daily. , Disp: , Rfl:    carboxymethylcellulose (REFRESH PLUS) 0.5 % SOLN, Place 1 drop into both eyes 3 (three) times daily as needed (dry/irritated eyes.)., Disp: , Rfl:    diphenoxylate-atropine (LOMOTIL) 2.5-0.025 MG tablet, TAKE 1 TABLET EVERY 8 HOURS AS NEEDED FOR DIARRHEA (Patient taking differently: Take 1 tablet by mouth daily. ), Disp: 30 tablet, Rfl: 0   hydrALAZINE (APRESOLINE) 10 MG tablet, Take 1 tablet (10 mg total) by mouth every 8 (eight) hours., Disp: 90 tablet, Rfl: 0   levothyroxine (SYNTHROID) 75 MCG tablet, Take 1 tablet (75 mcg total) by mouth daily before breakfast., Disp: 30 tablet, Rfl: 0   loratadine (CLARITIN) 10 MG tablet, Take 1 tablet (10 mg total) by mouth daily. (for itching) (Patient taking differently: Take 10 mg by mouth as needed for itching. ), Disp: 30 tablet, Rfl: 11   meclizine (ANTIVERT) 25 MG tablet, Take 25 mg by mouth 3 (three) times daily as needed for dizziness or nausea. , Disp: , Rfl:    Multiple Vitamins-Minerals (CENTRUM SILVER ULTRA WOMENS PO), Take 1 tablet by mouth daily., Disp: , Rfl:    nitroGLYCERIN (NITROSTAT) 0.4 MG SL tablet, Place 1 tablet (0.4 mg total) under the tongue every 5 (five) minutes as needed for chest  pain., Disp: 25 tablet, Rfl: 2   pantoprazole (PROTONIX) 40 MG tablet, Take 1 tablet (40 mg total) by mouth every evening. (Patient taking differently: Take 40 mg by mouth daily. ), Disp: 90 tablet, Rfl: 3   PARoxetine (PAXIL) 20 MG tablet, TAKE (1) TABLET DAILY IN THE MORNING. (Patient taking differently: Take 20 mg by mouth daily. ), Disp: 90 tablet, Rfl: 1   rosuvastatin (CRESTOR) 40 MG tablet, Take 0.5 tablets (20 mg total) by mouth daily. Needs to be seen for further refills., Disp: , Rfl:    torsemide (DEMADEX) 20 MG tablet, Take 1 tablet (20 mg total) by mouth daily as needed (edema)., Disp: 30 tablet, Rfl: 0   Vitamin D, Ergocalciferol, (DRISDOL) 1.25 MG (50000  UNIT) CAPS capsule, TAKE 1 CAPSULE ONCE EVERY 7 DAYS, Disp: 8 capsule, Rfl: 0   isosorbide mononitrate (IMDUR) 30 MG 24 hr tablet, Take 0.5 tablets (15 mg total) by mouth daily., Disp: 15 tablet, Rfl: 6   linaclotide (LINZESS) 145 MCG CAPS capsule, Take 1 capsule (145 mcg total) by mouth daily before breakfast., Disp: 12 capsule, Rfl: 0 Social History   Socioeconomic History   Marital status: Widowed    Spouse name: Not on file   Number of children: 4   Years of education: 8   Highest education level: 8th grade  Occupational History   Occupation: Retired  Tobacco Use   Smoking status: Never Smoker   Smokeless tobacco: Never Used  Scientific laboratory technician Use: Never used  Substance and Sexual Activity   Alcohol use: No   Drug use: No   Sexual activity: Never    Birth control/protection: Post-menopausal  Other Topics Concern   Not on file  Social History Narrative   Not on file   Social Determinants of Health   Financial Resource Strain: Low Risk    Difficulty of Paying Living Expenses: Not hard at all  Food Insecurity: No Food Insecurity   Worried About Charity fundraiser in the Last Year: Never true   [place] in the Last Year: Never true  Transportation Needs: No Transportation Needs     Lack of Transportation (Medical): No   Lack of Transportation (Non-Medical): No  Physical Activity: Inactive   Days of Exercise per Week: 0 days   Minutes of Exercise per Session: 0 min  Stress: No Stress Concern Present   Feeling of Stress : Only a little  Social Connections: Moderately Integrated   Frequency of Communication with Friends and Family: More than three times a week   Frequency of Social Gatherings with Friends and Family: Once a week   Attends Religious Services: More than 4 times per year   Active Member of [employer] or Organizations: Yes   Attends Archivist Meetings: More than 4 times per year   Marital Status: Widowed  Human resources officer Violence: Not At Risk   Fear of Current or Ex-Partner: No   Emotionally Abused: No   Physically Abused: No   Sexually Abused: No   Family History  Problem Relation Age of Onset   Stroke Mother    Other Mother        brain tumor   Other Father        MI   Heart attack Father    Stroke Sister    Macular degeneration Sister    Diabetes Daughter    Cancer Daughter        ovarian   Colon polyps Daughter    Atrial fibrillation Sister    Hyperlipidemia Sister    Osteoporosis Sister    Stroke Sister    Uterine cancer Sister    Heart attack Brother    Heart disease Brother    Asthma Brother    Breast cancer Neg Hx     Objective: Office vital signs reviewed. BP (!) 134/78    Pulse 75    Temp (!) 96.9 F (36.1 C)    Ht 5\' 3"  (1.6 m)    Wt 140 lb 6.4 oz (63.7 kg)    SpO2 98%    BMI 24.87 kg/m   Physical Examination:  General: Awake, alert, elderly female, No acute distress HEENT: Normal; no exophthalmos.  No goiter Cardio: regular  rate and rhythm, S1S2 heard, no murmurs appreciated Pulm: clear to auscultation bilaterally, no wheezes, rhonchi or rales; normal work of breathing on room air Extremities: warm, well perfused, No edema, cyanosis or clubbing; +2 pulses bilaterally MSK:  slow gait and hunched station Skin: dry; intact; no rashes or lesions; normal temperature Neuro: No tremor  Assessment/ Plan: 86 y.o. female   1. Type 2 diabetes mellitus with stage 3b chronic kidney disease, with long-term current use of insulin (HCC) Diet controlled.  Advised against use of Fleet enemas given CKD.  Trial of Linzess as below - Bayer DCA Hb A1c Waived  2. Hyperlipidemia associated with type 2 diabetes mellitus ([place]) Continue statin  3. Hypertension associated with diabetes ([place]) Controlled  4. Paroxysmal atrial fibrillation, chads2 Vasc2 score of 5, on eliquis Rhythm and rate controlled.  Questionable melena.  FOBT was performed today  5. Anemia, unspecified type Uncertain etiology but possibly from GI bleed.  We discussed consideration for GI eval if symptoms are consistent with a bleed - Anemia panel - Fecal occult blood, imunochemical; Future - Fecal occult blood, imunochemical  6. Hypothyroidism due to medication Drug-induced.  I will repeat thyroid panel given ongoing rise in TSH.  May need to consider increasing the Synthroid.  She has constipation as of late.  Previously was affected by diarrhea instead - Thyroid Panel With TSH  7. Dysuria Urine with blood, glucose.  Sent for culture as no bacteria were seen under microscopy.  Only 0-2 RBCs noted under microscopy so blood may in fact be myoglobin.  She is established with [facility] in [place]. - Urinalysis, Complete - Urine Culture  8. Chronic idiopathic constipation Trial of Linzess provided   Orders Placed This Encounter  Procedures   Fecal occult blood, imunochemical    Standing Status:   Future    Number of Occurrences:   1    Standing Expiration Date:   12/16/2019   Bayer DCA Hb A1c Waived   Thyroid Panel With TSH   Anemia panel   Urinalysis, Complete   Meds ordered this encounter  Medications   linaclotide (LINZESS) 145 MCG CAPS capsule    Sig: Take 1 capsule (145 mcg  total) by mouth daily before breakfast.    Dispense:  12 capsule    Refill:  0     Sallyann Kinnaird Windell [place], [place] [phone]

## 2019-08-16 LAB — FECAL OCCULT BLOOD, IMMUNOCHEMICAL: Fecal Occult Bld: NEGATIVE

## 2019-08-17 LAB — ANEMIA PANEL

## 2019-08-17 LAB — SPECIMEN STATUS REPORT

## 2019-08-18 LAB — ANEMIA PROFILE B
Basophils Absolute: 0.1 10*3/uL (ref 0.0–0.2)
Basos: 1 %
EOS (ABSOLUTE): 0.5 10*3/uL — ABNORMAL HIGH (ref 0.0–0.4)
Eos: 8 %
Ferritin: 192 ng/mL — ABNORMAL HIGH (ref 15–150)
Folate: >20 ng/mL (ref >3.0)
Hematocrit: 36.1 % (ref 34.0–46.6)
Hemoglobin: 11.6 g/dL (ref 11.1–15.9)
Immature Grans (Abs): 0 10*3/uL (ref 0.0–0.1)
Immature Granulocytes: 1 %
Iron Saturation: 30 % (ref 15–55)
Iron: 69 ug/dL (ref 27–139)
Lymphocytes Absolute: 2 10*3/uL (ref 0.7–3.1)
Lymphs: 32 %
MCH: 31 pg (ref 26.6–33.0)
MCHC: 32.1 g/dL (ref 31.5–35.7)
MCV: 97 fL (ref 79–97)
Monocytes Absolute: 0.8 10*3/uL (ref 0.1–0.9)
Monocytes: 12 %
Neutrophils Absolute: 2.9 10*3/uL (ref 1.4–7.0)
Neutrophils: 46 %
Platelets: 254 10*3/uL (ref 150–450)
RBC: 3.74 x10E6/uL — ABNORMAL LOW (ref 3.77–5.28)
RDW: 14 % (ref 11.7–15.4)
Retic Ct Pct: 2 % (ref 0.6–2.6)
Total Iron Binding Capacity: 228 ug/dL — ABNORMAL LOW (ref 250–450)
UIBC: 159 ug/dL (ref 118–369)
Vitamin B-12: 939 pg/mL (ref 232–1245)
WBC: 6.2 10*3/uL (ref 3.4–10.8)

## 2019-08-18 LAB — THYROID PANEL WITH TSH
Free Thyroxine Index: 2.3 (ref 1.2–4.9)
T3 Uptake Ratio: 27 % (ref 24–39)
T4, Total: 8.4 ug/dL (ref 4.5–12.0)
TSH: 14.4 u[IU]/mL — ABNORMAL HIGH (ref 0.450–4.500)

## 2019-08-20 ENCOUNTER — Telehealth: Payer: Self-pay | Admitting: Family Medicine

## 2019-08-20 LAB — URINE CULTURE

## 2019-08-20 NOTE — Telephone Encounter (Signed)
Pt said she was recently put on some new medicine for her heart and thyroid and says shes not sure if its a reaction to the medicine that she is having but she hasnt been able to have a bowel movement since starting both meds and says she is burning all over.

## 2019-08-20 NOTE — Telephone Encounter (Signed)
She is not having severe pain just "fullness and tight" in the stomach.

## 2019-08-20 NOTE — Telephone Encounter (Signed)
Pt states she has been constipated which was addressed at last visit but the Linzess isn't helping as much as she thought. She also has concerns and is c/o a "burning sensation"throughout her body. She denies fever just states she is "hot". She finally had a BM last night that was very loose but she still feels "full and tight" in stomach which is uncomfortable. She has been drinking prune juice to try and help with her BM as she used to have 3 a day. She is worried it may be due to the Amiodarone or the levothyroxine. Please advise?

## 2019-08-20 NOTE — Telephone Encounter (Signed)
Glycerin suppository fine.  Colace ok to use as well. NO magnesium citrate or fleet enemas.

## 2019-08-20 NOTE — Telephone Encounter (Signed)
Should not be due to thyroid.  If anything her thyroid is getting better and should help stool.  Not sure about "burning" with amiodarone. Will cc to Almyra Free to see if she has any knowledge of this.  If have severe abdominal pain, I want her checked out ASAP

## 2019-08-22 ENCOUNTER — Other Ambulatory Visit: Payer: Self-pay | Admitting: Family Medicine

## 2019-08-22 ENCOUNTER — Telehealth: Payer: Self-pay | Admitting: Family Medicine

## 2019-08-22 DIAGNOSIS — R300 Dysuria: Secondary | ICD-10-CM

## 2019-08-22 MED ORDER — CEPHALEXIN 500 MG PO CAPS: 500.0000 mg | ORAL_CAPSULE | Freq: Two times a day (BID) | ORAL | 0 refills | Status: AC

## 2019-08-22 NOTE — Telephone Encounter (Signed)
Pt aware of provider feedback and voiced understanding. 

## 2019-08-22 NOTE — Telephone Encounter (Signed)
Pt called stating that she has called a few times this week to get her lab results that she had done a week ago. Wants someone to call her with results asap.

## 2019-08-22 NOTE — Telephone Encounter (Signed)
Patient aware.

## 2019-08-22 NOTE — Telephone Encounter (Signed)
Antibiotic sent

## 2019-08-22 NOTE — Telephone Encounter (Signed)
Pt aware.

## 2019-08-22 NOTE — Telephone Encounter (Signed)
Pt wanted to know her results. Can you look at her urine culture results. Looks like she had 2 organisms growing and pt is still having some lower abdominal discomfort with some burning with urination that comes and goes.

## 2019-08-27 ENCOUNTER — Other Ambulatory Visit: Payer: Self-pay | Admitting: *Deleted

## 2019-08-27 ENCOUNTER — Other Ambulatory Visit: Payer: Self-pay | Admitting: Physician Assistant

## 2019-08-27 ENCOUNTER — Other Ambulatory Visit: Payer: Self-pay | Admitting: Medical

## 2019-08-27 NOTE — Telephone Encounter (Signed)
This is Dr. Croitoru's pt 

## 2019-09-01 ENCOUNTER — Other Ambulatory Visit: Payer: Self-pay | Admitting: *Deleted

## 2019-09-01 ENCOUNTER — Telehealth: Payer: Self-pay | Admitting: Family Medicine

## 2019-09-01 MED ORDER — LEVOTHYROXINE SODIUM 75 MCG PO TABS: 75.0000 ug | ORAL_TABLET | Freq: Every day | ORAL | 0 refills | Status: DC

## 2019-09-01 MED ORDER — HYDRALAZINE HCL 10 MG PO TABS: 10.0000 mg | ORAL_TABLET | Freq: Three times a day (TID) | ORAL | 0 refills | Status: DC

## 2019-09-01 NOTE — Telephone Encounter (Signed)
Patient aware and verbalizes understanding.  Rx sent.

## 2019-09-01 NOTE — Telephone Encounter (Signed)
  Prescription Request  09/01/2019  What is the name of the medication or equipment? levothyroxine (SYNTHROID) 75 MCG tablet and hydrALAZINE (APRESOLINE) 10 MG tablet  Have you contacted your pharmacy to request a refill? (if applicable) yes but no answer from office. Prescribed from [facility]. Pt aware no refills. She should be still taking.  Which pharmacy would you like this sent to? [facility]   Patient notified that their request is being sent to the clinical staff for review and that they should receive a response within 2 business days.

## 2019-09-01 NOTE — Telephone Encounter (Signed)
Yes to both.  Please authorize #[id number] for both.  Have patient see me back in 3 months for repeat thyroid testing.

## 2019-09-03 ENCOUNTER — Encounter (INDEPENDENT_AMBULATORY_CARE_PROVIDER_SITE_OTHER): Payer: Self-pay | Admitting: Ophthalmology

## 2019-09-03 ENCOUNTER — Ambulatory Visit (INDEPENDENT_AMBULATORY_CARE_PROVIDER_SITE_OTHER): Payer: Medicare Other | Admitting: Ophthalmology

## 2019-09-03 ENCOUNTER — Other Ambulatory Visit: Payer: Self-pay

## 2019-09-03 DIAGNOSIS — H353211 Exudative age-related macular degeneration, right eye, with active choroidal neovascularization: Secondary | ICD-10-CM

## 2019-09-03 MED ORDER — BEVACIZUMAB CHEMO INJECTION 1.25MG/0.05ML SYRINGE FOR KALEIDOSCOPE
1.2500 mg | INTRAVITREAL | Status: AC | PRN
  Administered 2019-09-03: 1.25 mg via INTRAVITREAL

## 2019-09-03 NOTE — Assessment & Plan Note (Signed)
OD, vision limited by subretinal scarring stable see above follow-up examination post Avastin.  We will repeat injection today to prevent enlargement of scotoma OD

## 2019-09-03 NOTE — Progress Notes (Signed)
09/03/2019     CHIEF COMPLAINT Patient presents for Retina Follow Up   HISTORY OF PRESENT ILLNESS: [name] is a 86 y.o. female who presents to the clinic today for:   HPI    Retina Follow Up    Patient presents with  Wet AMD.  In right eye.  Severity is moderate.  Duration of 6 weeks.  Since onset it is stable.  I, the attending physician,  performed the HPI with the patient and updated documentation appropriately.          Comments    6 Week Wet AMD f\u OD. Possible Avastin OD. OCT  Pt states she saw a "pearl" floater around in OS vision last week and this week she is seeing a black floater in OD vision. Denies any other complaints.       Last edited by Tilda Franco on 09/03/2019  8:42 AM. (History)      Referring physician: [name], DO [place],  [place] [id number]  HISTORICAL INFORMATION:   Selected notes from the MEDICAL RECORD NUMBER    Lab Results  Component Value Date   HGBA1C 6.6 08/15/2019     CURRENT MEDICATIONS: Current Outpatient Medications (Ophthalmic Drugs)  Medication Sig   carboxymethylcellulose (REFRESH PLUS) 0.5 % SOLN Place 1 drop into both eyes 3 (three) times daily as needed (dry/irritated eyes.).   No current facility-administered medications for this visit. (Ophthalmic Drugs)   Current Outpatient Medications (Other)  Medication Sig   amiodarone (PACERONE) 200 MG tablet TAKE 1 TABLET DAILY   apixaban (ELIQUIS) 2.5 MG TABS tablet Take one tablet by mouth twice daily (Patient taking differently: Take 2.5 mg by mouth daily. )   Calcium Citrate-Vitamin D (CITRACAL + D PO) Take 1 tablet by mouth daily.    diphenoxylate-atropine (LOMOTIL) 2.5-0.025 MG tablet TAKE 1 TABLET EVERY 8 HOURS AS NEEDED FOR DIARRHEA (Patient taking differently: Take 1 tablet by mouth daily. )   hydrALAZINE (APRESOLINE) 10 MG tablet Take 1 tablet (10 mg total) by mouth every 8 (eight) hours.   isosorbide mononitrate (IMDUR) 30 MG  24 hr tablet TAKE ONE HALF (1/2) TABLET DAILY   levothyroxine (SYNTHROID) 75 MCG tablet Take 1 tablet (75 mcg total) by mouth daily before breakfast.   linaclotide (LINZESS) 145 MCG CAPS capsule Take 1 capsule (145 mcg total) by mouth daily before breakfast.   loratadine (CLARITIN) 10 MG tablet Take 1 tablet (10 mg total) by mouth daily. (for itching) (Patient taking differently: Take 10 mg by mouth as needed for itching. )   meclizine (ANTIVERT) 25 MG tablet Take 25 mg by mouth 3 (three) times daily as needed for dizziness or nausea.    Multiple Vitamins-Minerals (CENTRUM SILVER ULTRA WOMENS PO) Take 1 tablet by mouth daily.   nitroGLYCERIN (NITROSTAT) 0.4 MG SL tablet Place 1 tablet (0.4 mg total) under the tongue every 5 (five) minutes as needed for chest pain.   pantoprazole (PROTONIX) 40 MG tablet Take 1 tablet (40 mg total) by mouth every evening. (Patient taking differently: Take 40 mg by mouth daily. )   PARoxetine (PAXIL) 20 MG tablet TAKE (1) TABLET DAILY IN THE MORNING. (Patient taking differently: Take 20 mg by mouth daily. )   rosuvastatin (CRESTOR) 40 MG tablet Take 0.5 tablets (20 mg total) by mouth daily. Needs to be seen for further refills.   torsemide (DEMADEX) 20 MG tablet Take 1 tablet (20 mg total) by mouth daily as needed (  edema).   Vitamin D, Ergocalciferol, (DRISDOL) 1.25 MG (50000 UNIT) CAPS capsule TAKE 1 CAPSULE ONCE EVERY 7 DAYS   No current facility-administered medications for this visit. (Other)      REVIEW OF SYSTEMS:    ALLERGIES Allergies  Allergen Reactions   Sotalol Other (See Comments)    Torsades    Bactrim [Sulfamethoxazole-Trimethoprim]     Itchy rash   Ciprofloxacin Nausea Only   Actos [Pioglitazone] Swelling   Adhesive [Tape] Other (See Comments)    Causes sores   Contrast Media [Iodinated Diagnostic Agents] Other (See Comments)    Headache (no action/pre-med required)   Latex Rash   Lipitor [Atorvastatin] Other (See  Comments)    myalgia    PAST MEDICAL HISTORY Past Medical History:  Diagnosis Date   AICD (automatic cardioverter/defibrillator) present    Allergy    SESONAL   Anxiety    ARTHRITIS    Arthritis    Atrial fibrillation (HCC)    CAD (coronary artery disease)    a. history of cardiac arrest 1993;  b. s/p LAD/LCX stenting in 2003;  c. 07/2011 Cath: LM nl, LAD patent stent, D1 80ost, LCX patent stent, RCA min irregs;  d. 04/2012 MV: EF 66%, no ishcemia;  e. 12/2013 Echo: Ef 55%, no rwma, Gr 1 DD, triv AI/MR, mildly dil LA;  f. 12/2013 Lexi MV: intermediate risk - apical ischemia and inf/infsept fixed defect, ? artifactual.   CAD in native artery 12/31/2013   Previous stents to LAD and Ramus patent on cath today 12/31/13 also There is severe disease in the ostial first diagonal which is unchanged from most recent cardiac catheterization. The right coronary artery could not be engaged selectively but nonselective angiography showed no significant disease in the proximal and midsegment.      Cataract    DENIES   Chronic back pain    Chronic sinus bradycardia    CKD (chronic kidney disease), stage III    Clotting disorder ([place])    DVT   COLITIS 12/02/2007   Qualifier: Diagnosis of  By: [name], [name]     Diabetes mellitus without complication ([place])    DENIES   Diverticulosis of colon (without mention of hemorrhage) 2007   Colonoscopy    Esophageal stricture    a. 2012 s/p dil.   Esophagitis, unspecified    a. 2012 EGD   EXTERNAL HEMORRHOIDS    GERD (gastroesophageal reflux disease)    omeprazole   H/O hiatal hernia    Hiatal hernia    a. 2012 EGD.   History of DVT in the past, not on Coumadin now    left  leg   HYPERCHOLESTEROLEMIA    Hypertension    ICD (implantable cardiac defibrillator) in place    a. s/p initial ICD in 1993 in setting of cardiac arrest;  b. 01/2007 gen change: Guidant T135 Vitality DS VR single lead ICD.   Macular  degeneration    gets injecton in eye every 5 weeks- last injection - 05/03/2013    Myocardial infarction [facility]) 1993   Near syncope 05/22/2015   Nephrolithiasis, just saw Dr [name]    Orthostatic hypotension    Peripheral vascular disease ([place])    ???   RECTAL BLEEDING 12/03/2007   Qualifier: Diagnosis of  By: [name]    Unspecified gastritis and gastroduodenitis without mention of hemorrhage    a. 2003 EGD->not noted on 2012 EGD.   Unstable angina, neg MI, cath stable.maybe  GI 12/30/2013   Past Surgical History:  Procedure Laterality Date   BREAST BIOPSY Left 11/2013   CARDIAC CATHETERIZATION     CARDIAC CATHETERIZATION  01/01/15   patent stents -there is severe disease in ostial 1st diag but without change.    CARDIAC DEFIBRILLATOR PLACEMENT  02/05/2007   Guidant   CARDIOVASCULAR STRESS TEST  12/30/2013   abnormal   CARDIOVERSION N/A 02/13/2019   Procedure: CARDIOVERSION;  Surgeon: Sanda Klein, MD;  Location: [facility];  Service: Cardiovascular;  Laterality: N/A;   CATARACT EXTRACTION Right    Dr. Katy Fitch   COLONOSCOPY     coronary stents      CYSTOSCOPY WITH URETEROSCOPY Right 05/20/2013   Procedure: CYSTOSCOPY, RIGHT URETEROSCOPY STONE EXTRACTION, Insertion of right DOUBLE J STENT ;  Surgeon: Irine Seal, MD;  Location: [facility];  Service: Urology;  Laterality: Right;   CYSTOSCOPY WITH URETEROSCOPY AND STENT PLACEMENT N/A 06/03/2013   Procedure: SECOND LOOK CYSTOSCOPY WITH URETEROSCOPY  HOLMIUM LASER LITHO AND STONE EXTRACTION Sammie Bench ;  Surgeon: Malka So, MD;  Location: [facility];  Service: Urology;  Laterality: N/A;   GIVENS CAPSULE STUDY N/A 10/30/2016   Procedure: GIVENS CAPSULE STUDY;  Surgeon: Irene Shipper, MD;  Location: [facility] ENDOSCOPY;  Service: Endoscopy;  Laterality: N/A;  NEEDS TO BE ADMITTED FOR OBSERVATION PT. HAS DEFIB   HEMORROIDECTOMY  80's   HOLMIUM LASER APPLICATION Right 01/19/9700   Procedure: HOLMIUM LASER  APPLICATION;  Surgeon: Irine Seal, MD;  Location: [facility];  Service: Urology;  Laterality: Right;   HYSTEROSCOPY WITH D & C  09/02/2010   Procedure: DILATATION AND CURETTAGE (D&C) /HYSTEROSCOPY;  Surgeon: Margarette Asal;  Location: [facility];  Service: Gynecology;  Laterality: N/A;  Dilation and Curettage with Hysteroscopy and Polypectomy   IMPLANTABLE CARDIOVERTER DEFIBRILLATOR (ICD) GENERATOR CHANGE N/A 02/10/2014   Procedure: ICD GENERATOR CHANGE;  Surgeon: Sanda Klein, MD;  Location: [facility];  Service: Cardiovascular;  Laterality: N/A;   LEFT HEART CATHETERIZATION WITH CORONARY ANGIOGRAM N/A 07/28/2011   Procedure: LEFT HEART CATHETERIZATION WITH CORONARY ANGIOGRAM;  Surgeon: Lorretta Harp, MD;  Location: [facility] CATH LAB;  Service: Cardiovascular;  Laterality: N/A;   LEFT HEART CATHETERIZATION WITH CORONARY ANGIOGRAM N/A 12/31/2013   Procedure: LEFT HEART CATHETERIZATION WITH CORONARY ANGIOGRAM;  Surgeon: Wellington Hampshire, MD;  Location: [facility] CATH LAB;  Service: Cardiovascular;  Laterality: N/A;    FAMILY HISTORY Family History  Problem Relation Age of Onset   Stroke Mother    Other Mother        brain tumor   Other Father        MI   Heart attack Father    Stroke Sister    Macular degeneration Sister    Diabetes Daughter    Cancer Daughter        ovarian   Colon polyps Daughter    Atrial fibrillation Sister    Hyperlipidemia Sister    Osteoporosis Sister    Stroke Sister    Uterine cancer Sister    Heart attack Brother    Heart disease Brother    Asthma Brother    Breast cancer Neg Hx     SOCIAL HISTORY Social History   Tobacco Use   Smoking status: Never Smoker   Smokeless tobacco: Never Used  Scientific laboratory technician Use: Never used  Substance Use Topics   Alcohol use: No   Drug use: No         OPHTHALMIC EXAM:  Base Eye Exam    Visual Acuity (Snellen - Linear)      Right Left   Dist [place] 20/200 E Card @ 4'   Dist ph [place] 20/50 NI        Tonometry (Tonopen, 8:49 AM)      Right Left   Pressure 15 17       Pupils      Pupils Dark Light Shape React APD   Right PERRL 4 4 Round Minimal None   Left PERRL 4 4 Round Minimal None       Visual Fields (Counting fingers)      Left Right   Restrictions Partial outer superior temporal, inferior temporal, superior nasal, inferior nasal deficiencies Partial outer superior temporal, inferior temporal, superior nasal, inferior nasal deficiencies       Neuro/Psych    Oriented x3: Yes   Mood/Affect: Normal       Dilation    Both eyes: 1.0% Mydriacyl, 2.5% Phenylephrine @ 8:49 AM        Slit Lamp and Fundus Exam    External Exam      Right Left   External Normal Normal       Slit Lamp Exam      Right Left   Lids/Lashes Normal Normal   Conjunctiva/Sclera White and quiet White and quiet   Cornea Clear Clear   Anterior Chamber Deep and quiet Deep and quiet   Iris Round and reactive Round and reactive   Lens Posterior chamber intraocular lens Posterior chamber intraocular lens   Anterior Vitreous Normal Normal       Fundus Exam      Right Left   Posterior Vitreous Central vitreous floaters, Posterior vitreous detachment Posterior vitreous detachment   Disc Normal Normal   C/D Ratio 0.15 0.15   Macula Atrophy, Mottling, Retinal pigment epithelial mottling with subretinal red telangiectasia and/or CVN in the region of atrophy Geographic atrophy, thighs, Hard drusen, Advanced age related macular degeneration   Vessels Normal Normal   Periphery Normal Normal          IMAGING AND PROCEDURES  Imaging and Procedures for 09/03/19  OCT, Retina - OU - Both Eyes       Right Eye Quality was good. Scan locations included subfoveal. Central Foveal Thickness: 202. Progression has been stable. Findings include subretinal scarring, abnormal foveal contour, no IRF, no SRF, subretinal hyper-reflective material, outer retinal atrophy, central retinal atrophy.   Left  Eye Quality was good. Scan locations included subfoveal. Central Foveal Thickness: 308. Progression has been stable. Findings include no IRF, no SRF, abnormal foveal contour, subretinal hyper-reflective material, central retinal atrophy, outer retinal atrophy, inner retinal atrophy.   Notes OD macular findings stable with subretinal scarring, last injection was done 3 months previous       Intravitreal Injection, Pharmacologic Agent - OD - Right Eye       Time Out 09/03/2019. 9:51 AM. Confirmed correct patient, procedure, site, and patient consented.   Anesthesia Topical anesthesia was used. Anesthetic medications included [place] 3.5%.   Procedure Preparation included Tobramycin 0.3%, 10% betadine to eyelids, 5% betadine to ocular surface. A supplied needle was used.   Injection:  1.25 mg Bevacizumab (AVASTIN) SOLN   NDC: [address]-3, Lot: [id number]   Route: Intravitreal, Site: Right Eye, Waste: 0 mg  Post-op Post injection exam found visual acuity of at least counting fingers. The patient tolerated the procedure well. There were no complications. The patient received written and verbal post procedure  care education. Post injection medications were not given.                 ASSESSMENT/PLAN:  Exudative age-related macular degeneration of right eye with active choroidal neovascularization (HCC) OD, vision limited by subretinal scarring stable see above follow-up examination post Avastin.  We will repeat injection today to prevent enlargement of scotoma OD      ICD-10-CM   1. Exudative age-related macular degeneration of right eye with active choroidal neovascularization (HCC)  H35.3211 OCT, Retina - OU - Both Eyes    Intravitreal Injection, Pharmacologic Agent - OD - Right Eye    Bevacizumab (AVASTIN) SOLN 1.25 mg    1.  Injection intravitreal Avastin OD today  2.  Dilate OU next in 3 months possible injection OD  3.  Ophthalmic Meds Ordered this visit:  Meds ordered  this encounter  Medications   Bevacizumab (AVASTIN) SOLN 1.25 mg       Return in about 3 months (around 12/04/2019) for DILATE OU, AVASTIN OCT, OD.  There are no Patient Instructions on file for this visit.   Explained the diagnoses, plan, and follow up with the patient and they expressed understanding.  Patient expressed understanding of the importance of proper follow up care.   Clent Demark Rohail Klees M.D. Diseases & Surgery of the Retina and Vitreous Retina & Diabetic [place] 09/03/19     Abbreviations: M myopia (nearsighted); A astigmatism; H hyperopia (farsighted); P presbyopia; Mrx spectacle prescription;  CTL contact lenses; OD right eye; OS left eye; OU both eyes  XT exotropia; ET esotropia; PEK punctate epithelial keratitis; PEE punctate epithelial erosions; DES dry eye syndrome; MGD meibomian gland dysfunction; ATs artificial tears; PFAT's preservative free artificial tears; [place] nuclear sclerotic cataract; PSC posterior subcapsular cataract; ERM epi-retinal membrane; PVD posterior vitreous detachment; RD retinal detachment; DM diabetes mellitus; DR diabetic retinopathy; NPDR non-proliferative diabetic retinopathy; PDR proliferative diabetic retinopathy; CSME clinically significant macular edema; DME diabetic macular edema; dbh dot blot hemorrhages; CWS cotton wool spot; POAG primary open angle glaucoma; C/D cup-to-disc ratio; HVF humphrey visual field; GVF goldmann visual field; OCT optical coherence tomography; IOP intraocular pressure; BRVO Branch retinal vein occlusion; CRVO central retinal vein occlusion; CRAO central retinal artery occlusion; BRAO branch retinal artery occlusion; RT retinal tear; SB scleral buckle; PPV pars plana vitrectomy; VH Vitreous hemorrhage; PRP panretinal laser photocoagulation; IVK intravitreal kenalog; VMT vitreomacular traction; MH Macular hole;  NVD neovascularization of the disc; NVE neovascularization elsewhere; AREDS age related eye disease study;  ARMD age related macular degeneration; POAG primary open angle glaucoma; EBMD epithelial/anterior basement membrane dystrophy; ACIOL anterior chamber intraocular lens; IOL intraocular lens; PCIOL posterior chamber intraocular lens; Phaco/IOL phacoemulsification with intraocular lens placement; [place] photorefractive keratectomy; LASIK laser assisted in situ keratomileusis; HTN hypertension; DM diabetes mellitus; COPD chronic obstructive pulmonary disease

## 2019-09-17 ENCOUNTER — Other Ambulatory Visit: Payer: Self-pay | Admitting: Cardiovascular Disease

## 2019-09-23 ENCOUNTER — Other Ambulatory Visit: Payer: Self-pay | Admitting: Family Medicine

## 2019-09-23 ENCOUNTER — Other Ambulatory Visit: Payer: Self-pay | Admitting: Cardiovascular Disease

## 2019-09-23 DIAGNOSIS — E559 Vitamin D deficiency, unspecified: Secondary | ICD-10-CM

## 2019-09-24 ENCOUNTER — Ambulatory Visit (INDEPENDENT_AMBULATORY_CARE_PROVIDER_SITE_OTHER): Payer: Medicare Other | Admitting: Family Medicine

## 2019-09-24 ENCOUNTER — Encounter: Payer: Self-pay | Admitting: Family Medicine

## 2019-09-24 ENCOUNTER — Other Ambulatory Visit: Payer: Self-pay

## 2019-09-24 ENCOUNTER — Encounter: Payer: Self-pay | Admitting: "Endocrinology

## 2019-09-24 ENCOUNTER — Ambulatory Visit (INDEPENDENT_AMBULATORY_CARE_PROVIDER_SITE_OTHER): Payer: Medicare Other | Admitting: "Endocrinology

## 2019-09-24 VITALS — BP 195/88 | HR 67 | Temp 98.6°F | Ht 63.0 in | Wt 139.0 lb

## 2019-09-24 VITALS — BP 142/76 | HR 68 | Ht 63.0 in | Wt 139.2 lb

## 2019-09-24 DIAGNOSIS — I25119 Atherosclerotic heart disease of native coronary artery with unspecified angina pectoris: Secondary | ICD-10-CM

## 2019-09-24 DIAGNOSIS — M1711 Unilateral primary osteoarthritis, right knee: Secondary | ICD-10-CM

## 2019-09-24 DIAGNOSIS — E039 Hypothyroidism, unspecified: Secondary | ICD-10-CM | POA: Diagnosis not present

## 2019-09-24 MED ORDER — METHYLPREDNISOLONE ACETATE 40 MG/ML IJ SUSP
40.0000 mg | Freq: Once | INTRAMUSCULAR | Status: AC
  Administered 2019-09-24: 40 mg via INTRA_ARTICULAR

## 2019-09-24 NOTE — Patient Instructions (Signed)
Knee Injection A knee injection is a procedure to get medicine into your knee joint to relieve the pain, swelling, and stiffness of arthritis. Your health care provider uses a needle to inject medicine, which may also help to lubricate and cushion your knee joint. You may need more than one injection. Tell a health care provider about:  Any allergies you have.  All medicines you are taking, including vitamins, herbs, eye drops, creams, and over-the-counter medicines.  Any problems you or family members have had with anesthetic medicines.  Any blood disorders you have.  Any surgeries you have had.  Any medical conditions you have.  Whether you are pregnant or may be pregnant. What are the risks? Generally, this is a safe procedure. However, problems may occur, including:  Infection.  Bleeding.  Symptoms that get worse.  Damage to the area around your knee.  Allergic reaction to any of the medicines.  Skin reactions from repeated injections. What happens before the procedure?  Ask your health care provider about changing or stopping your regular medicines. This is especially important if you are taking diabetes medicines or blood thinners.  Plan to have someone take you home from the hospital or clinic. What happens during the procedure?   You will sit or lie down in a position for your knee to be treated.  The skin over your kneecap will be cleaned with a germ-killing soap.  You will be given a medicine that numbs the area (local anesthetic). You may feel some stinging.  The medicine will be injected into your knee. The needle is carefully placed between your kneecap and your knee. The medicine is injected into the joint space.  The needle will be removed at the end of the procedure.  A bandage (dressing) may be placed over the injection site. The procedure may vary among health care providers and hospitals. What can I expect after the procedure?  Your blood  pressure, heart rate, breathing rate, and blood oxygen level will be monitored until you leave the hospital or clinic.  You may have to move your knee through its full range of motion. This helps to get all the medicine into your joint space.  You will be watched to make sure that you do not have a reaction to the injected medicine.  You may feel more pain, swelling, and warmth than you did before the injection. This reaction may last about 1-2 days. Follow these instructions at home: Medicines  Take over-the-counter and prescription medicines only as told by your doctor.  Do not drive or use heavy machinery while taking prescription pain medicine.  Do not take medicines such as aspirin and ibuprofen unless your health care provider tells you to take them. Injection site care  Follow instructions from your health care provider about: ? How to take care of your puncture site. ? When and how you should change your dressing. ? When you should remove your dressing.  Check your injection area every day for signs of infection. Check for: ? More redness, swelling, or pain after 2 days. ? Fluid or blood. ? Pus or a bad smell. ? Warmth. Managing pain, stiffness, and swelling   If directed, put ice on the injection area: ? Put ice in a plastic bag. ? Place a towel between your skin and the bag. ? Leave the ice on for 20 minutes, 2-3 times per day.  Do not apply heat to your knee.  Raise (elevate) the injection area above the level   of your heart while you are sitting or lying down. General instructions  If you were given a dressing, keep it dry until your health care provider says it can be removed. Ask your health care provider when you can start showering or taking a bath.  Avoid strenuous activities for as long as directed by your health care provider. Ask your health care provider when you can return to your normal activities.  Keep all follow-up visits as told by your health  care provider. This is important. You may need more injections. Contact a health care provider if you have:  A fever.  Warmth in your injection area.  Fluid, blood, or pus coming from your injection site.  Symptoms at your injection site that last longer than 2 days after your procedure. Get help right away if:  Your knee: ? Turns very red. ? Becomes very swollen. ? Is in severe pain. Summary  A knee injection is a procedure to get medicine into your knee joint to relieve the pain, swelling, and stiffness of arthritis.  A needle is carefully placed between your kneecap and your knee to inject medicine into the joint space.  Before the procedure, ask your health care provider about changing or stopping your regular medicines, especially if you are taking diabetes medicines or blood thinners.  Contact your health care provider if you have any problems or questions after your procedure. This information is not intended to replace advice given to you by your health care provider. Make sure you discuss any questions you have with your health care provider. Document Revised: 01/22/2017 Document Reviewed: 01/22/2017 Elsevier Patient Education  2020 [employer]  

## 2019-09-24 NOTE — Progress Notes (Signed)
Subjective: CC: knee injection PCP: [name], DO TIW:[id number] [name] is a 86 y.o. female presenting to clinic today for:  1. Knee injection Patient requesting the injection for chronic right-sided knee pain.  Her last injection was in February of this year.  She tolerated that without difficulty.  She is unable to take NSAIDs secondary to CKD and chronic anticoagulation for atrial fibrillation.  She reports stiffness, pain with ambulation.  She just got a new cane that is 4-prong and seems to be a little bit more stable.   ROS: Per HPI  Allergies  Allergen Reactions  . Sotalol Other (See Comments)    Torsades   . Bactrim [Sulfamethoxazole-Trimethoprim]     Itchy rash  . Ciprofloxacin Nausea Only  . Actos [Pioglitazone] Swelling  . Adhesive [Tape] Other (See Comments)    Causes sores  . Contrast Media [Iodinated Diagnostic Agents] Other (See Comments)    Headache (no action/pre-med required)  . Latex Rash  . Lipitor [Atorvastatin] Other (See Comments)    myalgia   Past Medical History:  Diagnosis Date  . AICD (automatic cardioverter/defibrillator) present   . Allergy    SESONAL  . Anxiety   . ARTHRITIS   . Arthritis   . Atrial fibrillation ([place])   . CAD (coronary artery disease)    a. history of cardiac arrest 1993;  b. s/p LAD/LCX stenting in 2003;  c. 07/2011 Cath: LM nl, LAD patent stent, D1 80ost, LCX patent stent, RCA min irregs;  d. 04/2012 MV: EF 66%, no ishcemia;  e. 12/2013 Echo: Ef 55%, no rwma, Gr 1 DD, triv AI/MR, mildly dil LA;  f. 12/2013 Lexi MV: intermediate risk - apical ischemia and inf/infsept fixed defect, ? artifactual.  . CAD in native artery 12/31/2013   Previous stents to LAD and Ramus patent on cath today 12/31/13 also There is severe disease in the ostial first diagonal which is unchanged from most recent cardiac catheterization. The right coronary artery could not be engaged selectively but nonselective angiography showed no significant  disease in the proximal and midsegment.     . Cataract    DENIES  . Chronic back pain   . Chronic sinus bradycardia   . CKD (chronic kidney disease), stage III   . Clotting disorder (HCC)    DVT  . COLITIS 12/02/2007   Qualifier: Diagnosis of  By: Nils Pyle CMA ([place]), [name]    . Diabetes mellitus without complication ([place])    DENIES  . Diverticulosis of colon (without mention of hemorrhage) 2007   Colonoscopy   . Esophageal stricture    a. 2012 s/p dil.  . Esophagitis, unspecified    a. 2012 EGD  . EXTERNAL HEMORRHOIDS   . GERD (gastroesophageal reflux disease)    omeprazole  . H/O hiatal hernia   . Hiatal hernia    a. 2012 EGD.  Marland Kitchen History of DVT in the past, not on Coumadin now    left  leg  . HYPERCHOLESTEROLEMIA   . Hypertension   . ICD (implantable cardiac defibrillator) in place    a. s/p initial ICD in 1993 in setting of cardiac arrest;  b. 01/2007 gen change: Guidant T135 Vitality DS VR single lead ICD.  . Macular degeneration    gets injecton in eye every 5 weeks- last injection - 05/03/2013   . Myocardial infarction ([place]) 1993  . Near syncope 05/22/2015  . Nephrolithiasis, just saw Dr [name]   . Orthostatic hypotension   . Peripheral  vascular disease ([place])    ???  . RECTAL BLEEDING 12/03/2007   Qualifier: Diagnosis of  By: Sharlett Iles MD Byrd Hesselbach   . Unspecified gastritis and gastroduodenitis without mention of hemorrhage    a. 2003 EGD->not noted on 2012 EGD.  Marland Kitchen Unstable angina, neg MI, cath stable.maybe GI 12/30/2013    Current Outpatient Medications:  .  amiodarone (PACERONE) 200 MG tablet, TAKE 1 TABLET DAILY, Disp: 30 tablet, Rfl: 3 .  apixaban (ELIQUIS) 2.5 MG TABS tablet, Take one tablet by mouth twice daily (Patient taking differently: Take 2.5 mg by mouth daily. ), Disp: , Rfl:  .  carboxymethylcellulose (REFRESH PLUS) 0.5 % SOLN, Place 1 drop into both eyes 3 (three) times daily as needed (dry/irritated eyes.)., Disp: , Rfl:  .   diphenoxylate-atropine (LOMOTIL) 2.5-0.025 MG tablet, TAKE 1 TABLET EVERY 8 HOURS AS NEEDED FOR DIARRHEA (Patient taking differently: Take 1 tablet by mouth daily. ), Disp: 30 tablet, Rfl: 0 .  hydrALAZINE (APRESOLINE) 10 MG tablet, TAKE 1 TABLET EVERY 8 HOURS, Disp: 90 tablet, Rfl: 0 .  isosorbide mononitrate (IMDUR) 30 MG 24 hr tablet, TAKE ONE HALF (1/2) TABLET DAILY, Disp: 90 tablet, Rfl: 2 .  levothyroxine (SYNTHROID) 75 MCG tablet, Take 1 tablet (75 mcg total) by mouth daily before breakfast., Disp: 90 tablet, Rfl: 0 .  loratadine (CLARITIN) 10 MG tablet, Take 1 tablet (10 mg total) by mouth daily. (for itching) (Patient taking differently: Take 10 mg by mouth as needed for itching. ), Disp: 30 tablet, Rfl: 11 .  meclizine (ANTIVERT) 25 MG tablet, Take 25 mg by mouth 3 (three) times daily as needed for dizziness or nausea. , Disp: , Rfl:  .  metoprolol tartrate (LOPRESSOR) 25 MG tablet, TAKE  (1)  TABLET TWICE A DAY. (Patient taking differently: As needed), Disp: 180 tablet, Rfl: 0 .  Multiple Vitamins-Minerals (CENTRUM SILVER ULTRA WOMENS PO), Take 1 tablet by mouth daily., Disp: , Rfl:  .  nitroGLYCERIN (NITROSTAT) 0.4 MG SL tablet, Place 1 tablet (0.4 mg total) under the tongue every 5 (five) minutes as needed for chest pain., Disp: 25 tablet, Rfl: 2 .  pantoprazole (PROTONIX) 40 MG tablet, Take 1 tablet (40 mg total) by mouth every evening. (Patient taking differently: Take 40 mg by mouth daily. ), Disp: 90 tablet, Rfl: 3 .  PARoxetine (PAXIL) 20 MG tablet, TAKE (1) TABLET DAILY IN THE MORNING. (Patient taking differently: Take 20 mg by mouth daily. ), Disp: 90 tablet, Rfl: 1 .  rosuvastatin (CRESTOR) 40 MG tablet, Take 0.5 tablets (20 mg total) by mouth daily. Needs to be seen for further refills., Disp: , Rfl:  .  torsemide (DEMADEX) 20 MG tablet, Take 1 tablet (20 mg total) by mouth daily as needed (edema)., Disp: 30 tablet, Rfl: 0 .  Vitamin D, Ergocalciferol, (DRISDOL) 1.25 MG (50000  UNIT) CAPS capsule, TAKE 1 CAPSULE ONCE EVERY 7 DAYS, Disp: 8 capsule, Rfl: 0 Social History   Socioeconomic History  . Marital status: Widowed    Spouse name: Not on file  . Number of children: 4  . Years of education: 8  . Highest education level: 8th grade  Occupational History  . Occupation: Retired  Tobacco Use  . Smoking status: Never Smoker  . Smokeless tobacco: Never Used  Vaping Use  . Vaping Use: Never used  Substance and Sexual Activity  . Alcohol use: No  . Drug use: No  . Sexual activity: Never    Birth control/protection: Post-menopausal  Other Topics Concern  . Not on file  Social History Narrative  . Not on file   Social Determinants of Health   Financial Resource Strain: Low Risk   . Difficulty of Paying Living Expenses: Not hard at all  Food Insecurity: No Food Insecurity  . Worried About Charity fundraiser in the Last Year: Never true  . Ran Out of Food in the Last Year: Never true  Transportation Needs: No Transportation Needs  . Lack of Transportation (Medical): No  . Lack of Transportation (Non-Medical): No  Physical Activity: Inactive  . Days of Exercise per Week: 0 days  . Minutes of Exercise per Session: 0 min  Stress: No Stress Concern Present  . Feeling of Stress : Only a little  Social Connections: Moderately Integrated  . Frequency of Communication with Friends and Family: More than three times a week  . Frequency of Social Gatherings with Friends and Family: Once a week  . Attends Religious Services: More than 4 times per year  . Active Member of Clubs or Organizations: Yes  . Attends Archivist Meetings: More than 4 times per year  . Marital Status: Widowed  Intimate Partner Violence: Not At Risk  . Fear of Current or Ex-Partner: No  . Emotionally Abused: No  . Physically Abused: No  . Sexually Abused: No   Family History  Problem Relation Age of Onset  . Stroke Mother   . Other Mother        brain tumor  . Other  Father        MI  . Heart attack Father   . Stroke Sister   . Macular degeneration Sister   . Diabetes Daughter   . Cancer Daughter        ovarian  . Colon polyps Daughter   . Atrial fibrillation Sister   . Hyperlipidemia Sister   . Osteoporosis Sister   . Stroke Sister   . Uterine cancer Sister   . Heart attack Brother   . Heart disease Brother   . Asthma Brother   . Breast cancer Neg Hx     Objective: Office vital signs reviewed. BP (!) 195/88   Pulse 67   Temp 98.6 F (37 C) (Temporal)   Ht 5\' 3"  (1.6 m)   Wt 139 lb (63 kg)   SpO2 96%   BMI 24.62 kg/m   Physical Examination:  General: Awake, alert, elderly female, No acute distress MSK: antalgic gait. Prominent rib hump with associated scoliosis noted in the thoracics. Right knee with effusion, soft tissue swelling. No warmth, erythema.  JOINT INJECTION:  Patient denies allergy to antiseptics (including iodine) and anesthetics.  Patient has no h/o diabetes, frequent steroid use, but she is on blood thinners/ antiplatelets.  No prior bleeding problems with previous corticosteroid injection.  Patient was given informed consent and a signed copy has been placed in the chart. Appropriate time out was taken. Area prepped and draped in usual sterile fashion. Anatomic landmarks were identified and injection site was marked.  Ethyl chloride spray was used to numb the area and 1 cc of methylprednisolone 40 mg/ml plus  3 cc of 1% lidocaine without epinephrine was injected into the right knee using a(n) anteriomedial approach. The patient tolerated the procedure well and there were no immediate complications. Estimated blood loss is less than 5 cc.  Post procedure instructions were reviewed and handout outlining these instructions were provided to patient.  Assessment/ Plan: 86 y.o. female  1. Primary osteoarthritis of right knee Treated with corticosteroid injection.  Patient tolerated procedure without difficulty.  I suspect  that her blood pressure was elevated secondary to knee pain as her blood pressure when she was seen by endocrinology was 151 systolic.  She is to continue monitoring blood pressures closely at home and follow-up as needed   No orders of the defined types were placed in this encounter.  No orders of the defined types were placed in this encounter.    [name], DO [place] [phone]

## 2019-09-24 NOTE — Progress Notes (Signed)
Endocrinology Consult Note                                         09/24/2019, 10:25 PM   AVONDA [name] is a 86 y.o.-year-old female patient being seen in consultation for hypothyroidism referred by [name], DO.   Past Medical History:  Diagnosis Date  . AICD (automatic cardioverter/defibrillator) present   . Allergy    SESONAL  . Anxiety   . ARTHRITIS   . Arthritis   . Atrial fibrillation ([place])   . CAD (coronary artery disease)    a. history of cardiac arrest 1993;  b. s/p LAD/LCX stenting in 2003;  c. 07/2011 Cath: LM nl, LAD patent stent, D1 80ost, LCX patent stent, RCA min irregs;  d. 04/2012 MV: EF 66%, no ishcemia;  e. 12/2013 Echo: Ef 55%, no rwma, Gr 1 DD, triv AI/MR, mildly dil LA;  f. 12/2013 Lexi MV: intermediate risk - apical ischemia and inf/infsept fixed defect, ? artifactual.  . CAD in native artery 12/31/2013   Previous stents to LAD and Ramus patent on cath today 12/31/13 also There is severe disease in the ostial first diagonal which is unchanged from most recent cardiac catheterization. The right coronary artery could not be engaged selectively but nonselective angiography showed no significant disease in the proximal and midsegment.     . Cataract    DENIES  . Chronic back pain   . Chronic sinus bradycardia   . CKD (chronic kidney disease), stage III   . Clotting disorder (HCC)    DVT  . COLITIS 12/02/2007   Qualifier: Diagnosis of  By: Nils Pyle CMA ([place]), [name]    . Diabetes mellitus without complication ([place])    DENIES  . Diverticulosis of colon (without mention of hemorrhage) 2007   Colonoscopy   . Esophageal stricture    a. 2012 s/p dil.  . Esophagitis, unspecified    a. 2012 EGD  . EXTERNAL HEMORRHOIDS   . GERD (gastroesophageal reflux disease)    omeprazole  . H/O hiatal hernia   . Hiatal hernia    a. 2012 EGD.  Marland Kitchen History of DVT in the past, not on Coumadin  now    left  leg  . HYPERCHOLESTEROLEMIA   . Hypertension   . ICD (implantable cardiac defibrillator) in place    a. s/p initial ICD in 1993 in setting of cardiac arrest;  b. 01/2007 gen change: Guidant T135 Vitality DS VR single lead ICD.  . Macular degeneration    gets injecton in eye every 5 weeks- last injection - 05/03/2013   . Myocardial infarction ([place]) 1993  . Near syncope 05/22/2015  . Nephrolithiasis, just saw Dr [name]   . Orthostatic hypotension   . Peripheral vascular disease ([place])    ???  . RECTAL BLEEDING 12/03/2007   Qualifier: Diagnosis of  By: Sharlett Iles MD Byrd Hesselbach   . Unspecified gastritis and gastroduodenitis without mention  of hemorrhage    a. 2003 EGD->not noted on 2012 EGD.  Marland Kitchen Unstable angina, neg MI, cath stable.maybe GI 12/30/2013    Past Surgical History:  Procedure Laterality Date  . BREAST BIOPSY Left 11/2013  . CARDIAC CATHETERIZATION    . CARDIAC CATHETERIZATION  01/01/15   patent stents -there is severe disease in ostial 1st diag but without change.   Marland Kitchen CARDIAC DEFIBRILLATOR PLACEMENT  02/05/2007   Guidant  . CARDIOVASCULAR STRESS TEST  12/30/2013   abnormal  . CARDIOVERSION N/A 02/13/2019   Procedure: CARDIOVERSION;  Surgeon: Sanda Klein, MD;  Location: [facility];  Service: Cardiovascular;  Laterality: N/A;  . CATARACT EXTRACTION Right    Dr. Katy Fitch  . COLONOSCOPY    . coronary stents     . CYSTOSCOPY WITH URETEROSCOPY Right 05/20/2013   Procedure: CYSTOSCOPY, RIGHT URETEROSCOPY STONE EXTRACTION, Insertion of right DOUBLE J STENT ;  Surgeon: Irine Seal, MD;  Location: [facility];  Service: Urology;  Laterality: Right;  . CYSTOSCOPY WITH URETEROSCOPY AND STENT PLACEMENT N/A 06/03/2013   Procedure: SECOND LOOK CYSTOSCOPY WITH URETEROSCOPY  HOLMIUM LASER LITHO AND STONE EXTRACTION Sammie Bench ;  Surgeon: Malka So, MD;  Location: [facility];  Service: Urology;  Laterality: N/A;  . GIVENS CAPSULE STUDY N/A 10/30/2016   Procedure: GIVENS CAPSULE  STUDY;  Surgeon: Irene Shipper, MD;  Location: [facility];  Service: Endoscopy;  Laterality: N/A;  NEEDS TO BE ADMITTED FOR OBSERVATION PT. HAS DEFIB  . HEMORROIDECTOMY  80's  . HOLMIUM LASER APPLICATION Right 0/09/4707   Procedure: HOLMIUM LASER APPLICATION;  Surgeon: Irine Seal, MD;  Location: [facility];  Service: Urology;  Laterality: Right;  . HYSTEROSCOPY WITH D & C  09/02/2010   Procedure: DILATATION AND CURETTAGE (D&C) /HYSTEROSCOPY;  Surgeon: Margarette Asal;  Location: [facility];  Service: Gynecology;  Laterality: N/A;  Dilation and Curettage with Hysteroscopy and Polypectomy  . IMPLANTABLE CARDIOVERTER DEFIBRILLATOR (ICD) GENERATOR CHANGE N/A 02/10/2014   Procedure: ICD GENERATOR CHANGE;  Surgeon: Sanda Klein, MD;  Location: [facility];  Service: Cardiovascular;  Laterality: N/A;  . LEFT HEART CATHETERIZATION WITH CORONARY ANGIOGRAM N/A 07/28/2011   Procedure: LEFT HEART CATHETERIZATION WITH CORONARY ANGIOGRAM;  Surgeon: Lorretta Harp, MD;  Location: [facility];  Service: Cardiovascular;  Laterality: N/A;  . LEFT HEART CATHETERIZATION WITH CORONARY ANGIOGRAM N/A 12/31/2013   Procedure: LEFT HEART CATHETERIZATION WITH CORONARY ANGIOGRAM;  Surgeon: Wellington Hampshire, MD;  Location: [place] [facility];  Service: Cardiovascular;  Laterality: N/A;    Social History   Socioeconomic History  . Marital status: Widowed    Spouse name: Not on file  . Number of children: 4  . Years of education: 8  . Highest education level: 8th grade  Occupational History  . Occupation: Retired  Tobacco Use  . Smoking status: Never Smoker  . Smokeless tobacco: Never Used  Vaping Use  . Vaping Use: Never used  Substance and Sexual Activity  . Alcohol use: No  . Drug use: No  . Sexual activity: Never    Birth control/protection: Post-menopausal  Other Topics Concern  . Not on file  Social History Narrative  . Not on file   Social Determinants of Health   Financial Resource Strain: Low Risk   .  Difficulty of Paying Living Expenses: Not hard at all  Food Insecurity: No Food Insecurity  . Worried About Charity fundraiser in the Last Year: Never true  . Ran Out of Food in the  Last Year: Never true  Transportation Needs: No Transportation Needs  . Lack of Transportation (Medical): No  . Lack of Transportation (Non-Medical): No  Physical Activity: Inactive  . Days of Exercise per Week: 0 days  . Minutes of Exercise per Session: 0 min  Stress: No Stress Concern Present  . Feeling of Stress : Only a little  Social Connections: Moderately Integrated  . Frequency of Communication with Friends and Family: More than three times a week  . Frequency of Social Gatherings with Friends and Family: Once a week  . Attends Religious Services: More than 4 times per year  . Active Member of Clubs or Organizations: Yes  . Attends Archivist Meetings: More than 4 times per year  . Marital Status: Widowed    Family History  Problem Relation Age of Onset  . Stroke Mother   . Other Mother        brain tumor  . Other Father        MI  . Heart attack Father   . Stroke Sister   . Macular degeneration Sister   . Diabetes Daughter   . Cancer Daughter        ovarian  . Colon polyps Daughter   . Atrial fibrillation Sister   . Hyperlipidemia Sister   . Osteoporosis Sister   . Stroke Sister   . Uterine cancer Sister   . Heart attack Brother   . Heart disease Brother   . Asthma Brother   . Breast cancer Neg Hx     Outpatient Encounter Medications as of 09/24/2019  Medication Sig  . amiodarone (PACERONE) 200 MG tablet TAKE 1 TABLET DAILY  . apixaban (ELIQUIS) 2.5 MG TABS tablet Take one tablet by mouth twice daily (Patient taking differently: Take 2.5 mg by mouth daily. )  . carboxymethylcellulose (REFRESH PLUS) 0.5 % SOLN Place 1 drop into both eyes 3 (three) times daily as needed (dry/irritated eyes.).  Marland Kitchen diphenoxylate-atropine (LOMOTIL) 2.5-0.025 MG tablet TAKE 1 TABLET EVERY 8  HOURS AS NEEDED FOR DIARRHEA (Patient taking differently: Take 1 tablet by mouth daily. )  . hydrALAZINE (APRESOLINE) 10 MG tablet TAKE 1 TABLET EVERY 8 HOURS  . isosorbide mononitrate (IMDUR) 30 MG 24 hr tablet TAKE ONE HALF (1/2) TABLET DAILY  . levothyroxine (SYNTHROID) 75 MCG tablet Take 1 tablet (75 mcg total) by mouth daily before breakfast.  . loratadine (CLARITIN) 10 MG tablet Take 1 tablet (10 mg total) by mouth daily. (for itching) (Patient taking differently: Take 10 mg by mouth as needed for itching. )  . meclizine (ANTIVERT) 25 MG tablet Take 25 mg by mouth 3 (three) times daily as needed for dizziness or nausea.   . metoprolol tartrate (LOPRESSOR) 25 MG tablet TAKE  (1)  TABLET TWICE A DAY. (Patient taking differently: As needed)  . Multiple Vitamins-Minerals (CENTRUM SILVER ULTRA WOMENS PO) Take 1 tablet by mouth daily.  . nitroGLYCERIN (NITROSTAT) 0.4 MG SL tablet Place 1 tablet (0.4 mg total) under the tongue every 5 (five) minutes as needed for chest pain.  . pantoprazole (PROTONIX) 40 MG tablet Take 1 tablet (40 mg total) by mouth every evening. (Patient taking differently: Take 40 mg by mouth daily. )  . PARoxetine (PAXIL) 20 MG tablet TAKE (1) TABLET DAILY IN THE MORNING. (Patient taking differently: Take 20 mg by mouth daily. )  . rosuvastatin (CRESTOR) 40 MG tablet Take 0.5 tablets (20 mg total) by mouth daily. Needs to be seen for further refills.  Marland Kitchen  torsemide (DEMADEX) 20 MG tablet Take 1 tablet (20 mg total) by mouth daily as needed (edema).  . Vitamin D, Ergocalciferol, (DRISDOL) 1.25 MG (50000 UNIT) CAPS capsule TAKE 1 CAPSULE ONCE EVERY 7 DAYS  . [DISCONTINUED] Calcium Citrate-Vitamin D (CITRACAL + D PO) Take 1 tablet by mouth daily.   . [DISCONTINUED] linaclotide (LINZESS) 145 MCG CAPS capsule Take 1 capsule (145 mcg total) by mouth daily before breakfast.   No facility-administered encounter medications on file as of 09/24/2019.    ALLERGIES: Allergies  Allergen  Reactions  . Sotalol Other (See Comments)    Torsades   . Bactrim [Sulfamethoxazole-Trimethoprim]     Itchy rash  . Ciprofloxacin Nausea Only  . Actos [Pioglitazone] Swelling  . Adhesive [Tape] Other (See Comments)    Causes sores  . Contrast Media [Iodinated Diagnostic Agents] Other (See Comments)    Headache (no action/pre-med required)  . Latex Rash  . Lipitor [Atorvastatin] Other (See Comments)    myalgia   VACCINATION STATUS: Immunization History  Administered Date(s) Administered  . Fluad Quad(high Dose 65+) 10/23/2018  . Influenza, High Dose Seasonal PF 11/01/2015, 11/17/2016, 11/13/2017  . Influenza,inj,Quad PF,6+ Mos 11/11/2013, 10/21/2014  . Influenza-Unspecified 11/16/2012, 10/23/2016  . Pneumococcal Conjugate-13 11/18/2012  . Pneumococcal Polysaccharide-23 01/17/2003  . Tdap 05/09/2010  . Zoster 05/01/2011     HPI    [name]  is a patient with the above medical history. she was diagnosed  with hypothyroidism recently when she was in the hospital, currently on levothyroxine 75 mcg p.o. daily at breakfast with some improvement in her thyroid function tests and her clinical situation.  She reported symptoms including fatigue, cold intolerance, constipation, depression prior to initiation of her levothyroxine.  Of note, patient was diagnosed with atrial fibrillation last year when she was started on amiodarone approximately around January 2021.  Amiodarone is still going at 200 mg p.o. daily.  She reports compliance to her levothyroxine.  I reviewed patient's  thyroid tests:  Lab Results  Component Value Date   TSH 14.400 (H) 08/15/2019   TSH 56.294 (H) 07/25/2019   TSH 51.373 (H) 07/09/2019   TSH 5.095 (H) 03/11/2019   TSH 3.439 02/05/2018   TSH 3.300 12/31/2017   TSH 5.810 (H) 08/15/2017   TSH 2.560 11/17/2016   TSH 2.990 09/01/2016   TSH 3.130 10/21/2015   FREET4 0.66 07/26/2019   FREET4 1.20 12/07/2009    Patient still complains of  disequilibrium, and giddiness at times.  Pt denies feeling nodules in neck, hoarseness, dysphagia/odynophagia, SOB with lying down.  she denies family history of  thyroid disorders.  No family history of thyroid cancer.  No history of  radiation therapy to head or neck. No recent use of iodine supplements.  No history of autoimmune thyroid dysfunction.     ROS:  Constitutional: + Fluctuating body weight , +fatigue, no subjective hyperthermia, no subjective hypothermia Eyes: no blurry vision, no xerophthalmia ENT: no sore throat, no nodules palpated in throat, no dysphagia/odynophagia, no hoarseness Cardiovascular: no Chest Pain, no Shortness of Breath, no palpitations, no leg swelling Respiratory: no cough, no SOB Gastrointestinal: no Nausea/Vomiting/Diarhhea Musculoskeletal: no muscle/joint aches Skin: no rashes Neurological: no tremors, no numbness, no tingling, no dizziness Psychiatric: no depression, no anxiety   Physical Exam: BP (!) 142/76   Pulse 68   Ht 5\' 3"  (1.6 m)   Wt 139 lb 3.2 oz (63.1 kg)   BMI 24.66 kg/m  Wt Readings from Last 3 Encounters:  09/24/19 139  lb (63 kg)  09/24/19 139 lb 3.2 oz (63.1 kg)  08/15/19 140 lb 6.4 oz (63.7 kg)    Constitutional:  Body mass index is 24.66 kg/m., not in acute distress, normal state of mind Eyes: PERRLA, EOMI, no exophthalmos ENT: moist mucous membranes, + thyromegaly, no cervical lymphadenopathy Cardiovascular: normal precordial activity, Regular Rate and Rhythm, no Murmur/Rubs/Gallops Respiratory:  adequate breathing efforts, no gross chest deformity, Clear to auscultation bilaterally Gastrointestinal: abdomen soft, Non -tender, No distension, Bowel Sounds present Musculoskeletal: + Walks with a cane , diffusely arthritic joints .  Skin: moist, warm, no rashes Neurological: no tremor with outstretched hands, Deep tendon reflexes normal in all four extremities.   CMP ( most recent) CMP     Component Value Date/Time    NA 140 07/26/2019 0727   NA 139 02/07/2019 0934   K 4.1 07/26/2019 0727   CL 107 07/26/2019 0727   CO2 24 07/26/2019 0727   GLUCOSE 122 (H) 07/26/2019 0727   BUN 15 07/26/2019 0727   BUN 10 02/07/2019 0934   CREATININE 1.27 (H) 07/26/2019 0727   CREATININE 1.52 (H) 07/31/2012 1019   CALCIUM 8.7 (L) 07/26/2019 0727   PROT 5.5 (L) 07/26/2019 0727   PROT 6.7 10/23/2018 0947   ALBUMIN 2.9 (L) 07/26/2019 0727   ALBUMIN 4.0 10/23/2018 0947   AST 56 (H) 07/26/2019 0727   ALT 27 07/26/2019 0727   ALKPHOS 60 07/26/2019 0727   BILITOT 0.7 07/26/2019 0727   BILITOT 0.7 10/23/2018 0947   GFRNONAA 38 (L) 07/26/2019 0727   GFRNONAA 33 (L) 07/31/2012 1019   GFRAA 45 (L) 07/26/2019 0727   GFRAA 38 (L) 07/31/2012 1019     Diabetic Labs (most recent): Lab Results  Component Value Date   HGBA1C 6.6 08/15/2019   HGBA1C 7.0 (H) 10/23/2018   HGBA1C 7.1 (H) 07/12/2018     Lipid Panel ( most recent) Lipid Panel     Component Value Date/Time   CHOL 139 10/23/2018 0947   CHOL 140 07/31/2012 1019   TRIG 211 (H) 12/28/2018 1601   TRIG 115 06/07/2015 1111   TRIG 161 (H) 07/31/2012 1019   HDL 53 10/23/2018 0947   HDL 68 06/07/2015 1111   HDL 43 07/31/2012 1019   CHOLHDL 2.6 10/23/2018 0947   CHOLHDL 2.8 09/02/2010 0629   VLDL 34 09/02/2010 0629   LDLCALC 65 10/23/2018 0947   LDLCALC 65 09/09/2013 0841   LDLCALC 65 07/31/2012 1019   LABVLDL 21 10/23/2018 0947       Lab Results  Component Value Date   TSH 14.400 (H) 08/15/2019   TSH 56.294 (H) 07/25/2019   TSH 51.373 (H) 07/09/2019   TSH 5.095 (H) 03/11/2019   TSH 3.439 02/05/2018   TSH 3.300 12/31/2017   TSH 5.810 (H) 08/15/2017   TSH 2.560 11/17/2016   TSH 2.990 09/01/2016   TSH 3.130 10/21/2015   FREET4 0.66 07/26/2019   FREET4 1.20 12/07/2009       ASSESSMENT: 1. 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 On  physical exam , patient  does   have  gross goiter-we will be considered for thyroid ultrasound on subsequent visits. She is accompanied by her daughter-in-law.  I had a discussion with her about the need for repeat thyroid function test before dose adjustment is made on her levothyroxine.  She will be sent to lab today and patient will be on phone visit on Friday September 26, 2019. In the meantime, I advised her to remain on levothyroxine 75 mcg p.o. daily before breakfast. - We discussed about correct intake of levothyroxine, at fasting, with water, separated by at least 30 minutes from breakfast, and separated by more than 4 hours from calcium, iron, multivitamins, acid reflux medications (PPIs). -Patient is made aware of the fact that thyroid hormone replacement is needed for life, dose to be adjusted by periodic monitoring of thyroid function tests.  -Due to absence of clinical goiter, no need for thyroid ultrasound. -I also have a long discussion about the need to stay on amiodarone based on the cardiology recommendation as necessary.  - Time spent with the patient: 45 minutes, of which >50% was spent in obtaining information about her symptoms, reviewing her previous labs, evaluations, and treatments, counseling her about her hypothyroidism, and developing a plan to confirm the diagnosis and long term treatment as necessary. Please refer to " Patient Self Inventory" in the Media  tab for reviewed elements of pertinent patient history.  Etheleen Nicks participated in the discussions, expressed understanding, and voiced agreement with the above plans.  All questions were answered to her satisfaction. she is encouraged to contact clinic should she have any questions or concerns prior to her return visit.  Return in about 1 week (around 10/01/2019), or Phone Visit as sson as possibe, for Labs Today- Non-Fasting Ok.  Glade Lloyd, MD [facility] [address] [place], [place] [address] Phone: [phone]  Fax: [phone]   09/24/2019, 10:25 PM  This note was partially dictated with voice recognition software. Similar sounding words can be transcribed inadequately or may not  be corrected upon review.

## 2019-09-24 NOTE — Addendum Note (Signed)
Addended byCarrolyn Leigh on: 09/24/2019 02:54 PM   Modules accepted: Orders

## 2019-09-25 LAB — THYROGLOBULIN ANTIBODY: Thyroglobulin Antibody: <1 IU/mL (ref 0.0–0.9)

## 2019-09-25 LAB — TSH: TSH: 6.95 u[IU]/mL — ABNORMAL HIGH (ref 0.450–4.500)

## 2019-09-25 LAB — THYROID PEROXIDASE ANTIBODY: Thyroperoxidase Ab SerPl-aCnc: 8 IU/mL (ref 0–34)

## 2019-09-25 LAB — T4, FREE: Free T4: 1.5 ng/dL (ref 0.82–1.77)

## 2019-09-25 LAB — T3, FREE: T3, Free: 2 pg/mL (ref 2.0–4.4)

## 2019-09-26 ENCOUNTER — Other Ambulatory Visit: Payer: Self-pay

## 2019-09-26 ENCOUNTER — Encounter: Payer: Self-pay | Admitting: "Endocrinology

## 2019-09-26 ENCOUNTER — Telehealth (INDEPENDENT_AMBULATORY_CARE_PROVIDER_SITE_OTHER): Payer: Medicare Other | Admitting: "Endocrinology

## 2019-09-26 VITALS — Ht 63.0 in | Wt 139.0 lb

## 2019-09-26 DIAGNOSIS — E039 Hypothyroidism, unspecified: Secondary | ICD-10-CM

## 2019-09-26 DIAGNOSIS — I25119 Atherosclerotic heart disease of native coronary artery with unspecified angina pectoris: Secondary | ICD-10-CM | POA: Diagnosis not present

## 2019-09-26 NOTE — Progress Notes (Signed)
09/26/2019, 12:31 PM                                Endocrinology Telehealth Visit Follow up Note -During COVID -19 Pandemic  This visit type was conducted  via telephone due to national recommendations for restrictions regarding the COVID-19 Pandemic  in an effort to limit this patient's exposure and mitigate transmission of the corona virus.   I connected with [name] on 09/26/2019   by telephone and verified that I am speaking with the correct person using two identifiers. [name], 04-27-1931. she has verbally consented to this visit.  I was in my office and patient was in her residence. No other persons were with me during the encounter. All issues noted in this document were discussed and addressed. The format was not optimal for physical exam.    [name] is a 86 y.o.-year-old female patient was seen in consultation for hypothyroidism referred by [name], DO.   Past Medical History:  Diagnosis Date  . AICD (automatic cardioverter/defibrillator) present   . Allergy    SESONAL  . Anxiety   . ARTHRITIS   . Arthritis   . Atrial fibrillation ([place])   . CAD (coronary artery disease)    a. history of cardiac arrest 1993;  b. s/p LAD/LCX stenting in 2003;  c. 07/2011 Cath: LM nl, LAD patent stent, D1 80ost, LCX patent stent, RCA min irregs;  d. 04/2012 MV: EF 66%, no ishcemia;  e. 12/2013 Echo: Ef 55%, no rwma, Gr 1 DD, triv AI/MR, mildly dil LA;  f. 12/2013 Lexi MV: intermediate risk - apical ischemia and inf/infsept fixed defect, ? artifactual.  . CAD in native artery 12/31/2013   Previous stents to LAD and Ramus patent on cath today 12/31/13 also There is severe disease in the ostial first diagonal which is unchanged from most recent cardiac catheterization. The right coronary artery could not be engaged selectively but nonselective angiography showed no  significant disease in the proximal and midsegment.     . Cataract    DENIES  . Chronic back pain   . Chronic sinus bradycardia   . CKD (chronic kidney disease), stage III   . Clotting disorder (HCC)    DVT  . COLITIS 12/02/2007   Qualifier: Diagnosis of  By: Nils Pyle CMA ([place]), [name]    . Diabetes mellitus without complication ([place])    DENIES  . Diverticulosis of colon (without mention of hemorrhage) 2007   Colonoscopy   . Esophageal stricture    a. 2012 s/p dil.  . Esophagitis, unspecified    a. 2012 EGD  . EXTERNAL HEMORRHOIDS   . GERD (gastroesophageal reflux disease)    omeprazole  . H/O hiatal hernia   . Hiatal hernia    a. 2012 EGD.  Marland Kitchen History of DVT  in the past, not on Coumadin now    left  leg  . HYPERCHOLESTEROLEMIA   . Hypertension   . ICD (implantable cardiac defibrillator) in place    a. s/p initial ICD in 1993 in setting of cardiac arrest;  b. 01/2007 gen change: Guidant T135 Vitality DS VR single lead ICD.  . Macular degeneration    gets injecton in eye every 5 weeks- last injection - 05/03/2013   . Myocardial infarction ([place]) 1993  . Near syncope 05/22/2015  . Nephrolithiasis, just saw Dr [name]   . Orthostatic hypotension   . Peripheral vascular disease ([place])    ???  . RECTAL BLEEDING 12/03/2007   Qualifier: Diagnosis of  By: Sharlett Iles MD Byrd Hesselbach   . Unspecified gastritis and gastroduodenitis without mention of hemorrhage    a. 2003 EGD->not noted on 2012 EGD.  Marland Kitchen Unstable angina, neg MI, cath stable.maybe GI 12/30/2013    Past Surgical History:  Procedure Laterality Date  . BREAST BIOPSY Left 11/2013  . CARDIAC CATHETERIZATION    . CARDIAC CATHETERIZATION  01/01/15   patent stents -there is severe disease in ostial 1st diag but without change.   Marland Kitchen CARDIAC DEFIBRILLATOR PLACEMENT  02/05/2007   Guidant  . CARDIOVASCULAR STRESS TEST  12/30/2013   abnormal  . CARDIOVERSION N/A 02/13/2019   Procedure: CARDIOVERSION;  Surgeon: Sanda Klein,  MD;  Location: [facility];  Service: Cardiovascular;  Laterality: N/A;  . CATARACT EXTRACTION Right    Dr. Katy Fitch  . COLONOSCOPY    . coronary stents     . CYSTOSCOPY WITH URETEROSCOPY Right 05/20/2013   Procedure: CYSTOSCOPY, RIGHT URETEROSCOPY STONE EXTRACTION, Insertion of right DOUBLE J STENT ;  Surgeon: Irine Seal, MD;  Location: [facility];  Service: Urology;  Laterality: Right;  . CYSTOSCOPY WITH URETEROSCOPY AND STENT PLACEMENT N/A 06/03/2013   Procedure: SECOND LOOK CYSTOSCOPY WITH URETEROSCOPY  HOLMIUM LASER LITHO AND STONE EXTRACTION Sammie Bench ;  Surgeon: Malka So, MD;  Location: [facility];  Service: Urology;  Laterality: N/A;  . GIVENS CAPSULE STUDY N/A 10/30/2016   Procedure: GIVENS CAPSULE STUDY;  Surgeon: Irene Shipper, MD;  Location: [facility];  Service: Endoscopy;  Laterality: N/A;  NEEDS TO BE ADMITTED FOR OBSERVATION PT. HAS DEFIB  . HEMORROIDECTOMY  80's  . HOLMIUM LASER APPLICATION Right 02/19/2681   Procedure: HOLMIUM LASER APPLICATION;  Surgeon: Irine Seal, MD;  Location: [facility];  Service: Urology;  Laterality: Right;  . HYSTEROSCOPY WITH D & C  09/02/2010   Procedure: DILATATION AND CURETTAGE (D&C) /HYSTEROSCOPY;  Surgeon: Margarette Asal;  Location: [facility];  Service: Gynecology;  Laterality: N/A;  Dilation and Curettage with Hysteroscopy and Polypectomy  . IMPLANTABLE CARDIOVERTER DEFIBRILLATOR (ICD) GENERATOR CHANGE N/A 02/10/2014   Procedure: ICD GENERATOR CHANGE;  Surgeon: Sanda Klein, MD;  Location: [facility];  Service: Cardiovascular;  Laterality: N/A;  . LEFT HEART CATHETERIZATION WITH CORONARY ANGIOGRAM N/A 07/28/2011   Procedure: LEFT HEART CATHETERIZATION WITH CORONARY ANGIOGRAM;  Surgeon: Lorretta Harp, MD;  Location: [facility];  Service: Cardiovascular;  Laterality: N/A;  . LEFT HEART CATHETERIZATION WITH CORONARY ANGIOGRAM N/A 12/31/2013   Procedure: LEFT HEART CATHETERIZATION WITH CORONARY ANGIOGRAM;  Surgeon: Wellington Hampshire, MD;  Location: [facility]  [facility];  Service: Cardiovascular;  Laterality: N/A;    Social History   Socioeconomic History  . Marital status: Widowed    Spouse name: Not on file  . Number of children: 4  . Years of education:  8  . Highest education level: 8th grade  Occupational History  . Occupation: Retired  Tobacco Use  . Smoking status: Never Smoker  . Smokeless tobacco: Never Used  Vaping Use  . Vaping Use: Never used  Substance and Sexual Activity  . Alcohol use: No  . Drug use: No  . Sexual activity: Never    Birth control/protection: Post-menopausal  Other Topics Concern  . Not on file  Social History Narrative  . Not on file   Social Determinants of Health   Financial Resource Strain: Low Risk   . Difficulty of Paying Living Expenses: Not hard at all  Food Insecurity: No Food Insecurity  . Worried About Charity fundraiser in the Last Year: Never true  . Ran Out of Food in the Last Year: Never true  Transportation Needs: No Transportation Needs  . Lack of Transportation (Medical): No  . Lack of Transportation (Non-Medical): No  Physical Activity: Inactive  . Days of Exercise per Week: 0 days  . Minutes of Exercise per Session: 0 min  Stress: No Stress Concern Present  . Feeling of Stress : Only a little  Social Connections: Moderately Integrated  . Frequency of Communication with Friends and Family: More than three times a week  . Frequency of Social Gatherings with Friends and Family: Once a week  . Attends Religious Services: More than 4 times per year  . Active Member of Clubs or Organizations: Yes  . Attends Archivist Meetings: More than 4 times per year  . Marital Status: Widowed    Family History  Problem Relation Age of Onset  . Stroke Mother   . Other Mother        brain tumor  . Other Father        MI  . Heart attack Father   . Stroke Sister   . Macular degeneration Sister   . Diabetes Daughter   . Cancer Daughter        ovarian  . Colon polyps Daughter    . Atrial fibrillation Sister   . Hyperlipidemia Sister   . Osteoporosis Sister   . Stroke Sister   . Uterine cancer Sister   . Heart attack Brother   . Heart disease Brother   . Asthma Brother   . Breast cancer Neg Hx     Outpatient Encounter Medications as of 09/26/2019  Medication Sig  . amiodarone (PACERONE) 200 MG tablet TAKE 1 TABLET DAILY  . apixaban (ELIQUIS) 2.5 MG TABS tablet Take one tablet by mouth twice daily (Patient taking differently: Take 2.5 mg by mouth 2 (two) times daily. )  . carboxymethylcellulose (REFRESH PLUS) 0.5 % SOLN Place 1 drop into both eyes 3 (three) times daily as needed (dry/irritated eyes.).  Marland Kitchen diphenoxylate-atropine (LOMOTIL) 2.5-0.025 MG tablet TAKE 1 TABLET EVERY 8 HOURS AS NEEDED FOR DIARRHEA (Patient taking differently: Take 1 tablet by mouth daily. )  . hydrALAZINE (APRESOLINE) 10 MG tablet TAKE 1 TABLET EVERY 8 HOURS  . isosorbide mononitrate (IMDUR) 30 MG 24 hr tablet TAKE ONE HALF (1/2) TABLET DAILY  . levothyroxine (SYNTHROID) 75 MCG tablet Take 1 tablet (75 mcg total) by mouth daily before breakfast.  . loratadine (CLARITIN) 10 MG tablet Take 1 tablet (10 mg total) by mouth daily. (for itching) (Patient taking differently: Take 10 mg by mouth as needed for itching. )  . meclizine (ANTIVERT) 25 MG tablet Take 25 mg by mouth 3 (three) times daily as needed for dizziness or  nausea.   . metoprolol tartrate (LOPRESSOR) 25 MG tablet TAKE  (1)  TABLET TWICE A DAY. (Patient taking differently: As needed)  . Multiple Vitamins-Minerals (CENTRUM SILVER ULTRA WOMENS PO) Take 1 tablet by mouth daily.  . nitroGLYCERIN (NITROSTAT) 0.4 MG SL tablet Place 1 tablet (0.4 mg total) under the tongue every 5 (five) minutes as needed for chest pain.  . pantoprazole (PROTONIX) 40 MG tablet Take 1 tablet (40 mg total) by mouth every evening. (Patient taking differently: Take 40 mg by mouth daily. )  . PARoxetine (PAXIL) 20 MG tablet TAKE (1) TABLET DAILY IN THE MORNING.  (Patient taking differently: Take 20 mg by mouth daily. )  . rosuvastatin (CRESTOR) 40 MG tablet Take 0.5 tablets (20 mg total) by mouth daily. Needs to be seen for further refills.  . torsemide (DEMADEX) 20 MG tablet Take 1 tablet (20 mg total) by mouth daily as needed (edema).  . Vitamin D, Ergocalciferol, (DRISDOL) 1.25 MG (50000 UNIT) CAPS capsule TAKE 1 CAPSULE ONCE EVERY 7 DAYS   No facility-administered encounter medications on file as of 09/26/2019.    ALLERGIES: Allergies  Allergen Reactions  . Sotalol Other (See Comments)    Torsades   . Bactrim [Sulfamethoxazole-Trimethoprim]     Itchy rash  . Ciprofloxacin Nausea Only  . Actos [Pioglitazone] Swelling  . Adhesive [Tape] Other (See Comments)    Causes sores  . Contrast Media [Iodinated Diagnostic Agents] Other (See Comments)    Headache (no action/pre-med required)  . Latex Rash  . Lipitor [Atorvastatin] Other (See Comments)    myalgia   VACCINATION STATUS: Immunization History  Administered Date(s) Administered  . Fluad Quad(high Dose 65+) 10/23/2018  . Influenza, High Dose Seasonal PF 11/01/2015, 11/17/2016, 11/13/2017  . Influenza,inj,Quad PF,6+ Mos 11/11/2013, 10/21/2014  . Influenza-Unspecified 11/16/2012, 10/23/2016  . Pneumococcal Conjugate-13 11/18/2012  . Pneumococcal Polysaccharide-23 01/17/2003  . Tdap 05/09/2010  . Zoster 05/01/2011     HPI    [name]  is a patient with the above medical history. she was diagnosed  with hypothyroidism recently when she was in the hospital, currently on levothyroxine 75 mcg p.o. daily at breakfast with some improvement in her thyroid function tests and her clinical situation.  She continues to report improvement in her symptoms.  Of note, she was started on amiodarone for atrial fibrillation/tachyarrhythmia around January 2021.  This medication is believed to have triggered her hypothyroidism.    Patient still complains of disequilibrium, and giddiness at  times.  Pt denies feeling nodules in neck, hoarseness, dysphagia/odynophagia, SOB with lying down.  she denies family history of  thyroid disorders.  No family history of thyroid cancer.  No history of  radiation therapy to head or neck. -  No history of autoimmune thyroid dysfunction.     ROS: Limited as above.   Physical Exam: Ht 5\' 3"  (1.6 m)   Wt 139 lb (63 kg)   BMI 24.62 kg/m  Wt Readings from Last 3 Encounters:  09/26/19 139 lb (63 kg)  09/24/19 139 lb (63 kg)  09/24/19 139 lb 3.2 oz (63.1 kg)       CMP ( most recent) CMP     Component Value Date/Time   NA 140 07/26/2019 0727   NA 139 02/07/2019 0934   K 4.1 07/26/2019 0727   CL 107 07/26/2019 0727   CO2 24 07/26/2019 0727   GLUCOSE 122 (H) 07/26/2019 0727   BUN 15 07/26/2019 0727   BUN 10 02/07/2019 0934  CREATININE 1.27 (H) 07/26/2019 0727   CREATININE 1.52 (H) 07/31/2012 1019   CALCIUM 8.7 (L) 07/26/2019 0727   PROT 5.5 (L) 07/26/2019 0727   PROT 6.7 10/23/2018 0947   ALBUMIN 2.9 (L) 07/26/2019 0727   ALBUMIN 4.0 10/23/2018 0947   AST 56 (H) 07/26/2019 0727   ALT 27 07/26/2019 0727   ALKPHOS 60 07/26/2019 0727   BILITOT 0.7 07/26/2019 0727   BILITOT 0.7 10/23/2018 0947   GFRNONAA 38 (L) 07/26/2019 0727   GFRNONAA 33 (L) 07/31/2012 1019   GFRAA 45 (L) 07/26/2019 0727   GFRAA 38 (L) 07/31/2012 1019     Diabetic Labs (most recent): Lab Results  Component Value Date   HGBA1C 6.6 08/15/2019   HGBA1C 7.0 (H) 10/23/2018   HGBA1C 7.1 (H) 07/12/2018     Lipid Panel ( most recent) Lipid Panel     Component Value Date/Time   CHOL 139 10/23/2018 0947   CHOL 140 07/31/2012 1019   TRIG 211 (H) 12/28/2018 1601   TRIG 115 06/07/2015 1111   TRIG 161 (H) 07/31/2012 1019   HDL 53 10/23/2018 0947   HDL 68 06/07/2015 1111   HDL 43 07/31/2012 1019   CHOLHDL 2.6 10/23/2018 0947   CHOLHDL 2.8 09/02/2010 0629   VLDL 34 09/02/2010 0629   LDLCALC 65 10/23/2018 0947   LDLCALC 65 09/09/2013 0841    LDLCALC 65 07/31/2012 1019   LABVLDL 21 10/23/2018 0947       Lab Results  Component Value Date   TSH 6.950 (H) 09/24/2019   TSH 14.400 (H) 08/15/2019   TSH 56.294 (H) 07/25/2019   TSH 51.373 (H) 07/09/2019   TSH 5.095 (H) 03/11/2019   TSH 3.439 02/05/2018   TSH 3.300 12/31/2017   TSH 5.810 (H) 08/15/2017   TSH 2.560 11/17/2016   TSH 2.990 09/01/2016   FREET4 1.50 09/24/2019   FREET4 0.66 07/26/2019   FREET4 1.20 12/07/2009       ASSESSMENT: 1. Hypothyroidism-likely amiodarone induced PLAN:  -Her previsit thyroid function tests show significant improvement, and consistent with appropriate replacement dose of levothyroxine. She is advised to continue levothyroxine 75 mcg p.o. daily before breakfast.    - We discussed about the correct intake of her thyroid hormone, on empty stomach at fasting, with water, separated by at least 30 minutes from breakfast and other medications,  and separated by more than 4 hours from calcium, iron, multivitamins, acid reflux medications (PPIs). -Patient is made aware of the fact that thyroid hormone replacement is needed for life, dose to be adjusted by periodic monitoring of thyroid function tests.   -Due to absence of clinical goiter, no need for thyroid ultrasound. -I also have a long discussion about the need to stay on amiodarone based on the cardiology recommendation as necessary.  She is advised to maintain close follow-up with her PCP Dr. Lajuana Ripple.    - Time spent on this patient care encounter:  20 minutes of which 50% was spent in  counseling and the rest reviewing  her current and  previous labs / studies and medications  doses and developing a plan for long term care. [name]  participated in the discussions, expressed understanding, and voiced agreement with the above plans.  All questions were answered to her satisfaction. she is encouraged to contact clinic should she have any questions or concerns prior to her return  visit.   Return in about 6 months (around 03/25/2020) for F/U with Pre-visit Labs.  Glade Lloyd, MD [facility] [address] [place], [place] [address] Phone: [phone]  Fax: [phone]   09/26/2019, 12:31 PM  This note was partially dictated with voice recognition software. Similar sounding words can be transcribed inadequately or may not  be corrected upon review.

## 2019-09-29 ENCOUNTER — Telehealth: Payer: Self-pay | Admitting: Cardiovascular Disease

## 2019-09-29 NOTE — Telephone Encounter (Signed)
Very hard to tell if the amiodarone is the culprit. It is not a very common side effect. We can try cutting back the amiodarone to 100 mg daily (half a tablet daily) please.

## 2019-09-29 NOTE — Telephone Encounter (Signed)
STAT if patient feels like he/she is going to faint   1) Are you dizzy now? Yes  2) Do you feel faint or have you passed out? No   3) Do you have any other symptoms? Patient states she has been trembling and experiencing blurred vision  4) Have you checked your HR and BP (record if available)?  BP: 194/94 HR: 60

## 2019-09-29 NOTE — Telephone Encounter (Signed)
Returned the call to the patient. She stated that she has been dealing with dizziness and equilibrium problems for a while now. She stated that the past three days have been worse. She feels dizzy when she lies in bed and lifts her head.   She has been taking her Meclizine 25 mg three times daily but this has not helped.   She stated that she saw her endocrinologist and they had discussed that it could be the amiodarone. She would like to have [name] opinion on this.

## 2019-09-30 MED ORDER — AMIODARONE HCL 100 MG PO TABS: 100.0000 mg | ORAL_TABLET | Freq: Every day | ORAL | Status: DC

## 2019-09-30 NOTE — Telephone Encounter (Signed)
Spoke with the patient. She will cut the amiodarone to 100 mg once daily.  She has a follow up on 10/09/19.

## 2019-10-06 ENCOUNTER — Other Ambulatory Visit: Payer: Self-pay | Admitting: Cardiovascular Disease

## 2019-10-06 ENCOUNTER — Other Ambulatory Visit: Payer: Self-pay | Admitting: Family Medicine

## 2019-10-06 DIAGNOSIS — I480 Paroxysmal atrial fibrillation: Secondary | ICD-10-CM

## 2019-10-06 NOTE — Telephone Encounter (Signed)
74F 63KG SCR 1.27 07/26/19 LOVW/MENG 01/24/19 Pt requesting 2.5mg  eliquis when qualifies for 5mg  will route to pharmd pool for further review

## 2019-10-07 MED ORDER — APIXABAN 5 MG PO TABS
5.0000 mg | ORAL_TABLET | Freq: Two times a day (BID) | ORAL | 1 refills | Status: DC
Start: 2019-10-07 — End: 2019-12-26

## 2019-10-07 NOTE — Telephone Encounter (Signed)
Per last note from [facility]  "Continue Eliquis 2.5 mg BID. Recheck renal function today. If <1.5, her dose will need to be increased to 5 mg BID."

## 2019-10-09 ENCOUNTER — Encounter: Payer: Self-pay | Admitting: Cardiovascular Disease

## 2019-10-09 ENCOUNTER — Ambulatory Visit (INDEPENDENT_AMBULATORY_CARE_PROVIDER_SITE_OTHER): Payer: Medicare Other | Admitting: Cardiovascular Disease

## 2019-10-09 ENCOUNTER — Other Ambulatory Visit: Payer: Self-pay

## 2019-10-09 VITALS — BP 188/87 | HR 62 | Ht 63.0 in | Wt 139.2 lb

## 2019-10-09 DIAGNOSIS — I2510 Atherosclerotic heart disease of native coronary artery without angina pectoris: Secondary | ICD-10-CM

## 2019-10-09 DIAGNOSIS — E119 Type 2 diabetes mellitus without complications: Secondary | ICD-10-CM

## 2019-10-09 DIAGNOSIS — Z7901 Long term (current) use of anticoagulants: Secondary | ICD-10-CM | POA: Diagnosis not present

## 2019-10-09 DIAGNOSIS — I25119 Atherosclerotic heart disease of native coronary artery with unspecified angina pectoris: Secondary | ICD-10-CM | POA: Diagnosis not present

## 2019-10-09 DIAGNOSIS — Z79899 Other long term (current) drug therapy: Secondary | ICD-10-CM

## 2019-10-09 DIAGNOSIS — I4729 Other ventricular tachycardia: Secondary | ICD-10-CM

## 2019-10-09 DIAGNOSIS — Z95810 Presence of automatic (implantable) cardiac defibrillator: Secondary | ICD-10-CM

## 2019-10-09 DIAGNOSIS — E7800 Pure hypercholesterolemia, unspecified: Secondary | ICD-10-CM

## 2019-10-09 DIAGNOSIS — I472 Ventricular tachycardia: Secondary | ICD-10-CM

## 2019-10-09 DIAGNOSIS — N1832 Chronic kidney disease, stage 3b: Secondary | ICD-10-CM

## 2019-10-09 DIAGNOSIS — I480 Paroxysmal atrial fibrillation: Secondary | ICD-10-CM | POA: Diagnosis not present

## 2019-10-09 DIAGNOSIS — Z5181 Encounter for therapeutic drug level monitoring: Secondary | ICD-10-CM | POA: Diagnosis not present

## 2019-10-09 DIAGNOSIS — I10 Essential (primary) hypertension: Secondary | ICD-10-CM

## 2019-10-09 MED ORDER — HYDRALAZINE HCL 10 MG PO TABS: ORAL_TABLET | ORAL | 4 refills | Status: DC

## 2019-10-09 NOTE — Progress Notes (Signed)
. Patient ID: [name], female   DOB: 1931-01-19, 86 y.o.   MRN: [id number]    Cardiology Office Note    Date:  10/10/2019   ID:  [name], [name] 1931/10/27, MRN [id number]  PCP:  [name], DO  Cardiologist:   Sanda Klein, MD   Chief Complaint  Patient presents with  . Atrial Fibrillation    History of Present Illness:  [name] is a 86 y.o. 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  In recent years she has had increasing problems with symptomatic atrial fibrillation.  Although she has a single chamber defibrillator, making it impossible to precisely diagnose the episodes of atrial fibrillation, there is clear evidence of periods of increased ventricular rate from her baseline, likely representing symptomatic atrial fibrillation.  She develops palpitation, chest discomfort, shortness of breath and if the spells are lengthy she eventually develops overt lower extremity edema.  She underwent a cardioversion in January 2021 from atrial fibrillation to normal rhythm, associated with improvement in symptoms.  She has been taking amiodarone since February.  She was hospitalized with dizziness, orthostatic hypotension and severe hypothyroidism (TSH 56) in July of this year.  It is likely she had iatrogenic hypothyroidism due to amiodarone.  Levothyroxine was started, beta-blocker therapy was discontinued and she was started on hydralazine.  Unfortunately she has run out of  this medication which will only had a 10-day prescription.  She is moderately hypertensive today with a systolic blood pressure of 188.  She has not had any bleeding problems, falls or injuries.  She denies orthopnea, PND or lower extremity edema.  Her biggest complaint continues to be dizziness.  This is often precipitated simply by turning her head to one side while in bed or sitting in a chair.  He was markedly worse when she was bradycardic and has previously been precipitated by medicines that will reduce her blood pressure.  She is feeling better after starting the levothyroxine.  She had a recent follow-up TSH that was almost back to normal at 6.95 (September 8).  Her lipid profile is generally favorable and her glycemic control is good.  Recent hemoglobin was 11.6.  Creatinine was about 1.3.  Most recent LVEF was 45-50% by echo in January 2021, prior to the cardioversion  She is in normal sinus rhythm today, with first-degree AV block and left bundle branch block.  She has a single-chamber defibrillator and we are therefore unable to pace the atrium for bradycardia and are trying to avoid pacing the ventricle since this may precipitate heart failure.  Interrogation of her defibrillator (single lead device) shows no recent episodes of high ventricular rates.  She had persistent bradycardia in June and July, likely related to her beta-blocker and hypothyroidism.Marland Kitchen Her device is a Office manager device was implanted as a generator change in 2016.  Estimated device longevity is another 10 years.  She never requires ventricular pacing (at least not yet), with the device programmed with a lower rate limit of 50 bpm..  Her ECG shows sinus rhythm with first-degree AV block, old left bundle branch  block with left axis deviation.  She was on treatment with disopyramide for years for her history of catecholaminergic polymorphic VT, but this medication had to be gradually discontinued due to side  effects, especially orthostatic hypotension and dry mouth.  In the past she developed torsades de pointes with sotalol and diarrhea with quinidine.  She has not had defibrillator discharges since the 1990s.   Past Medical History:  Diagnosis Date  . AICD (automatic cardioverter/defibrillator) present   . Allergy    SESONAL  . Anxiety   . ARTHRITIS   . Arthritis   . Atrial fibrillation ([place])   . CAD (coronary artery disease)    a. history of cardiac arrest 1993;  b. s/p LAD/LCX stenting in 2003;  c. 07/2011 Cath: LM nl, LAD patent stent, D1 80ost, LCX patent stent, RCA min irregs;  d. 04/2012 MV: EF 66%, no ishcemia;  e. 12/2013 Echo: Ef 55%, no rwma, Gr 1 DD, triv AI/MR, mildly dil LA;  f. 12/2013 Lexi MV: intermediate risk - apical ischemia and inf/infsept fixed defect, ? artifactual.  . CAD in native artery 12/31/2013   Previous stents to LAD and Ramus patent on cath today 12/31/13 also There is severe disease in the ostial first diagonal which is unchanged from most recent cardiac catheterization. The right coronary artery could not be engaged selectively but nonselective angiography showed no significant disease in the proximal and midsegment.     . Cataract    DENIES  . Chronic back pain   . Chronic sinus bradycardia   . CKD (chronic kidney disease), stage III   . Clotting disorder (HCC)    DVT  . COLITIS 12/02/2007   Qualifier: Diagnosis of  By: Nils Pyle CMA ([place]), [name]    . Diabetes mellitus without complication ([place])    DENIES  . Diverticulosis of colon (without mention of hemorrhage) 2007   Colonoscopy   . Esophageal stricture    a. 2012 s/p dil.  . Esophagitis, unspecified    a. 2012 EGD  . EXTERNAL HEMORRHOIDS   . GERD (gastroesophageal reflux disease)    omeprazole  . H/O hiatal hernia   . Hiatal hernia    a. 2012 EGD.  Marland Kitchen History of DVT in the past, not on Coumadin now    left  leg  . HYPERCHOLESTEROLEMIA   . Hypertension   . ICD (implantable cardiac  defibrillator) in place    a. s/p initial ICD in 1993 in setting of cardiac arrest;  b. 01/2007 gen change: Guidant T135 Vitality DS VR single lead ICD.  . Macular degeneration    gets injecton in eye every 5 weeks- last injection - 05/03/2013   . Myocardial infarction ([place]) 1993  . Near syncope 05/22/2015  . Nephrolithiasis, just saw Dr [name]   . Orthostatic hypotension   . Peripheral vascular disease ([place])    ???  . RECTAL BLEEDING 12/03/2007   Qualifier: Diagnosis of  By: Sharlett Iles MD Byrd Hesselbach   . Unspecified gastritis and gastroduodenitis without mention of hemorrhage    a. 2003 EGD->not noted on 2012 EGD.  Marland Kitchen Unstable angina, neg MI, cath stable.maybe GI 12/30/2013    Past Surgical History:  Procedure Laterality Date  . BREAST BIOPSY Left 11/2013  . CARDIAC CATHETERIZATION    . CARDIAC CATHETERIZATION  01/01/15   patent stents -there is severe disease in ostial 1st diag but without change.   Marland Kitchen CARDIAC DEFIBRILLATOR PLACEMENT  02/05/2007   Guidant  . CARDIOVASCULAR STRESS TEST  12/30/2013   abnormal  . CARDIOVERSION N/A 02/13/2019   Procedure: CARDIOVERSION;  Surgeon: Sanda Klein, MD;  Location: [facility];  Service: Cardiovascular;  Laterality: N/A;  . CATARACT EXTRACTION Right    Dr. Katy Fitch  . COLONOSCOPY    . coronary stents     . CYSTOSCOPY WITH URETEROSCOPY Right 05/20/2013   Procedure: CYSTOSCOPY, RIGHT URETEROSCOPY STONE EXTRACTION, Insertion of right DOUBLE J STENT ;  Surgeon: Irine Seal, MD;  Location: [facility];  Service: Urology;  Laterality: Right;  . CYSTOSCOPY WITH URETEROSCOPY AND STENT PLACEMENT N/A 06/03/2013   Procedure: SECOND LOOK CYSTOSCOPY WITH URETEROSCOPY  HOLMIUM LASER LITHO AND STONE EXTRACTION Sammie Bench ;  Surgeon: Malka So, MD;  Location: [facility];  Service: Urology;  Laterality: N/A;  . GIVENS CAPSULE STUDY N/A 10/30/2016   Procedure: GIVENS CAPSULE STUDY;  Surgeon: Irene Shipper, MD;  Location: [facility];  Service: Endoscopy;  Laterality:  N/A;  NEEDS TO BE ADMITTED FOR OBSERVATION PT. HAS DEFIB  . HEMORROIDECTOMY  80's  . HOLMIUM LASER APPLICATION Right 03/21/1441   Procedure: HOLMIUM LASER APPLICATION;  Surgeon: Irine Seal, MD;  Location: [facility];  Service: Urology;  Laterality: Right;  . HYSTEROSCOPY WITH D & C  09/02/2010   Procedure: DILATATION AND CURETTAGE (D&C) /HYSTEROSCOPY;  Surgeon: Margarette Asal;  Location: [facility];  Service: Gynecology;  Laterality: N/A;  Dilation and Curettage with Hysteroscopy and Polypectomy  . IMPLANTABLE CARDIOVERTER DEFIBRILLATOR (ICD) GENERATOR CHANGE N/A 02/10/2014   Procedure: ICD GENERATOR CHANGE;  Surgeon: Sanda Klein, MD;  Location: [facility];  Service: Cardiovascular;  Laterality: N/A;  . LEFT HEART CATHETERIZATION WITH CORONARY ANGIOGRAM N/A 07/28/2011   Procedure: LEFT HEART CATHETERIZATION WITH CORONARY ANGIOGRAM;  Surgeon: Lorretta Harp, MD;  Location: [facility] C[facility];  Service: Cardiovascular;  Laterality: N/A;  . LEFT HEART CATHETERIZATION WITH CORONARY ANGIOGRAM N/A 12/31/2013   Procedure: LEFT HEART CATHETERIZATION WITH CORONARY ANGIOGRAM;  Surgeon: Wellington Hampshire, MD;  Location: [facility] C[facility];  Service: Cardiovascular;  Laterality: N/A;    Current Medications: Outpatient Medications Prior to Visit  Medication Sig Dispense Refill  . amiodarone (PACERONE) 100 MG tablet Take 1 tablet (100 mg total) by mouth daily.    Marland Kitchen apixaban (ELIQUIS) 5 MG TABS tablet Take 1 tablet (5 mg total) by mouth 2 (two) times daily. 180 tablet 1  . carboxymethylcellulose (REFRESH PLUS) 0.5 % SOLN Place 1 drop into both eyes 3 (three) times daily as needed (dry/irritated eyes.).    Marland Kitchen diphenoxylate-atropine (LOMOTIL) 2.5-0.025 MG tablet TAKE 1 TABLET EVERY 8 HOURS AS NEEDED FOR DIARRHEA (Patient taking differently: Take 1 tablet by mouth daily. ) 30 tablet 0  . isosorbide mononitrate (IMDUR) 30 MG 24 hr tablet TAKE ONE HALF (1/2) TABLET DAILY 90 tablet 2  . levothyroxine (SYNTHROID) 75 MCG tablet Take 1  tablet (75 mcg total) by mouth daily before breakfast. 90 tablet 0  . loratadine (CLARITIN) 10 MG tablet Take 1 tablet (10 mg total) by mouth daily. (for itching) (Patient taking differently: Take 10 mg by mouth as needed for itching. ) 30 tablet 11  . meclizine (ANTIVERT) 25 MG tablet Take 25 mg by mouth 3 (three) times daily as needed for dizziness or nausea.     . metoprolol tartrate (LOPRESSOR) 25 MG tablet TAKE  (1)  TABLET TWICE A DAY. (Patient taking differently: As needed) 180 tablet 0  . Multiple Vitamins-Minerals (CENTRUM SILVER ULTRA WOMENS PO) Take 1 tablet by mouth daily.    Marland Kitchen  nitroGLYCERIN (NITROSTAT) 0.4 MG SL tablet Place 1 tablet (0.4 mg total) under the tongue every 5 (five) minutes as needed for chest pain. 25 tablet 2  . pantoprazole (PROTONIX) 40 MG tablet Take 1 tablet (40 mg total) by mouth every evening. (Patient taking differently: Take 40 mg by mouth daily. ) 90 tablet 3  . PARoxetine (PAXIL) 20 MG tablet TAKE (1) TABLET DAILY IN THE MORNING. (Patient taking differently: Take 20 mg by mouth daily. ) 90 tablet 1  . rosuvastatin (CRESTOR) 40 MG tablet TAKE (1/2) TABLET DAILY. 45 tablet 0  . torsemide (DEMADEX) 20 MG tablet Take 1 tablet (20 mg total) by mouth daily as needed (edema). 30 tablet 0  . Vitamin D, Ergocalciferol, (DRISDOL) 1.25 MG (50000 UNIT) CAPS capsule TAKE 1 CAPSULE ONCE EVERY 7 DAYS 8 capsule 0  . hydrALAZINE (APRESOLINE) 10 MG tablet TAKE 1 TABLET EVERY 8 HOURS 90 tablet 0   No facility-administered medications prior to visit.     Allergies:   Sotalol, Bactrim [sulfamethoxazole-trimethoprim], Ciprofloxacin, Actos [pioglitazone], Adhesive [tape], Contrast media [iodinated diagnostic agents], Latex, and Lipitor [atorvastatin]   Social History   Socioeconomic History  . Marital status: Widowed    Spouse name: Not on file  . Number of children: 4  . Years of education: 8  . Highest education level: 8th grade  Occupational History  . Occupation:  Retired  Tobacco Use  . Smoking status: Never Smoker  . Smokeless tobacco: Never Used  Vaping Use  . Vaping Use: Never used  Substance and Sexual Activity  . Alcohol use: No  . Drug use: No  . Sexual activity: Never    Birth control/protection: Post-menopausal  Other Topics Concern  . Not on file  Social History Narrative  . Not on file   Social Determinants of Health   Financial Resource Strain: Low Risk   . Difficulty of Paying Living Expenses: Not hard at all  Food Insecurity: No Food Insecurity  . Worried About Charity fundraiser in the Last Year: Never true  . Ran Out of Food in the Last Year: Never true  Transportation Needs: No Transportation Needs  . Lack of Transportation (Medical): No  . Lack of Transportation (Non-Medical): No  Physical Activity: Inactive  . Days of Exercise per Week: 0 days  . Minutes of Exercise per Session: 0 min  Stress: No Stress Concern Present  . Feeling of Stress : Only a little  Social Connections: Moderately Integrated  . Frequency of Communication with Friends and Family: More than three times a week  . Frequency of Social Gatherings with Friends and Family: Once a week  . Attends Religious Services: More than 4 times per year  . Active Member of Clubs or Organizations: Yes  . Attends Archivist Meetings: More than 4 times per year  . Marital Status: Widowed     Family History:  The patient's family history includes Asthma in her brother; Atrial fibrillation in her sister; Cancer in her daughter; Colon polyps in her daughter; Diabetes in her daughter; Heart attack in her brother and father; Heart disease in her brother; Hyperlipidemia in her sister; Macular degeneration in her sister; Osteoporosis in her sister; Other in her father and mother; Stroke in her mother, sister, and sister; Uterine cancer in her sister.   ROS:   Please see the history of present illness.    ROS All other systems are reviewed and are  negative.   PHYSICAL EXAM:   VS:  BP (!) 188/87   Pulse 62   Ht 5\' 3"  (1.6 m)   Wt 139 lb 3.2 oz (63.1 kg)   SpO2 99%   BMI 24.66 kg/m      General: Alert, oriented x3, no distress, healthy left subclavian defibrillator site Head: no evidence of trauma, PERRL, EOMI, no exophtalmos or lid lag, no myxedema, no xanthelasma; normal ears, nose and oropharynx Neck: normal jugular venous pulsations and no hepatojugular reflux; brisk carotid pulses without delay and no carotid bruits Chest: clear to auscultation, no signs of consolidation by percussion or palpation, normal fremitus, symmetrical and full respiratory excursions Cardiovascular: normal position and quality of the apical impulse, regular rhythm, normal first and paradoxically split second heart sounds, no murmurs, rubs or gallops Abdomen: no tenderness or distention, no masses by palpation, no abnormal pulsatility or arterial bruits, normal bowel sounds, no hepatosplenomegaly Extremities: no clubbing, cyanosis or edema; 2+ radial, ulnar and brachial pulses bilaterally; 2+ right femoral, posterior tibial and dorsalis pedis pulses; 2+ left femoral, posterior tibial and dorsalis pedis pulses; no subclavian or femoral bruits Neurological: grossly nonfocal Psych: Normal mood and affect   Wt Readings from Last 3 Encounters:  10/09/19 139 lb 3.2 oz (63.1 kg)  09/26/19 139 lb (63 kg)  09/24/19 139 lb (63 kg)    Studies/Labs Reviewed:   EKG:  EKG is ordered today and shows sinus rhythm, first-degree AV block, left bundle branch block, left axis deviation, unchanged from previous tracings   Recent Labs: 07/26/2019: ALT 27; BUN 15; Creatinine, Ser 1.27; Magnesium 1.8; Potassium 4.1; Sodium 140 08/15/2019: Hemoglobin 11.6; Platelets 254 09/24/2019: TSH 6.950   Lipid Panel    Component Value Date/Time   CHOL 139 10/23/2018 0947   CHOL 140 07/31/2012 1019   TRIG 211 (H) 12/28/2018 1601   TRIG 115 06/07/2015 1111   TRIG 161 (H)  07/31/2012 1019   HDL 53 10/23/2018 0947   HDL 68 06/07/2015 1111   HDL 43 07/31/2012 1019   CHOLHDL 2.6 10/23/2018 0947   CHOLHDL 2.8 09/02/2010 0629   VLDL 34 09/02/2010 0629   LDLCALC 65 10/23/2018 0947   LDLCALC 65 09/09/2013 0841   LDLCALC 65 07/31/2012 1019     ASSESSMENT:    1. Paroxysmal atrial fibrillation (HCC)   2. Encounter for monitoring amiodarone therapy   3. Catecholaminergic polymorphic ventricular tachycardia ([place])   4. Long term current use of anticoagulant   5. Coronary artery disease involving native coronary artery of native heart without angina pectoris   6. Essential hypertension   7. Diabetes mellitus type 2, diet-controlled ([address])   8. Stage 3b chronic kidney disease ([place])   9. ICD (implantable cardioverter-defibrillator) in place   10. Hypercholesterolemia      PLAN:  In order of problems listed above:  1. AFib: This has been symptomatic and has responded well to amiodarone.  Unfortunately amiodarone has been associated with iatrogenic thyroid dysfunction.  Compliant with anticoagulation. CHADSVasc 6 (age 21, gender, DM, CAD, hypertension).   2. Amiodarone: Successful in preventing atrial fibrillation recurrence (had early recurrence after previous cardioversion), but associated with iatrogenic hypothyroidism.  This is now well compensated.  Continue to check liver function tests every 6 months and TSH at least every 6 months.  Reminded her about the potential for lung complications and the need to avoid excessive sun exposure.  Yearly eye exam recommended. 3. VT: She has not had any therapy from her defibrillator since the early 1990s.  She did have a rather  lengthy 25 beat nonsustained VT run in September 2020 when we decreased her beta-blocker dose due to bradycardia.  Is likely that amiodarone will help with this.  Disopyramide was stopped several years ago due to complaints of dry mouth and orthostatic hypotension.   4. Eliquis: Dose adjusted for age  and worsening renal function at the creatinine has improved.  No bleeding complications. 5. CAD: Currently asymptomatic.  She did have chest heaviness when she had atrial fibrillation..  She has not required revascularization since 2003.  Her last assessment was a cardiac catheterization 2015 that did not show any significant coronary stenoses. 6. HTN/Orthostatic hypotension: Despite her problems with orthostatic hypotension, her systolic blood pressure is unacceptably high.  Restart hydralazine.  Reassess her blood pressure control in a few weeks. 7. DM: Controlled with diet, recent hemoglobin A1c 6.6 % in July 8. CKD 3b: Stable creatinine around 1.2-1.3, corresponding to a GFR around 40 9. ICD: Normal single-chamber device function.  Continue remote downloads every 3 months.. 10. HLP: Lipid parameters in target range on labs performed roughly 1 year ago other than mild hypertriglyceridemia.  Continue statin.    Medication Adjustments/Labs and Tests Ordered: Current medicines are reviewed at length with the patient today.  Concerns regarding medicines are outlined above.  Medication changes, Labs and Tests ordered today are listed in the Patient Instructions below. Patient Instructions  Medication Instructions:  RESTART the Hydralazine 10 mg three times daily  *If you need a refill on your cardiac medications before your next appointment, please call your pharmacy*   Lab Work: None ordered If you have labs (blood work) drawn today and your tests are completely normal, you will receive your results only by: Marland Kitchen MyChart Message (if you have MyChart) OR . A paper copy in the mail If you have any lab test that is abnormal or we need to change your treatment, we will call you to review the results.   Testing/Procedures: None ordered   Follow-Up: At [facility], you and your health needs are our priority.  As part of our continuing mission to provide you with exceptional heart care, we  have created designated Provider Care Teams.  These Care Teams include your primary Cardiologist (physician) and Advanced Practice Providers (APPs -  Physician Assistants and Nurse Practitioners) who all work together to provide you with the care you need, when you need it.  We recommend signing up for the patient portal called "MyChart".  Sign up information is provided on this After Visit Summary.  MyChart is used to connect with patients for Virtual Visits (Telemedicine).  Patients are able to view lab/test results, encounter notes, upcoming appointments, etc.  Non-urgent messages can be sent to your provider as well.   To learn more about what you can do with MyChart, go to [email or web address].    Your next appointment:   3 month(s) on a device day  The format for your next appointment:   In Person  Provider:   Sanda Klein, MD   Other Instructions Dr. Sallyanne Kuster would like you to check your blood pressure daily for the next 2 weeks.  Keep a journal of these daily blood pressure and heart rate readings and call our office or send a message through [place] with the results. Thank you!      Signed, Sanda Klein, MD  10/10/2019 9:03 PM    [facility] [address], [place], [place]  [address] Phone: [phone]; Fax: [phone]

## 2019-10-09 NOTE — Patient Instructions (Signed)
Medication Instructions:  RESTART the Hydralazine 10 mg three times daily  *If you need a refill on your cardiac medications before your next appointment, please call your pharmacy*   Lab Work: None ordered If you have labs (blood work) drawn today and your tests are completely normal, you will receive your results only by: Marland Kitchen MyChart Message (if you have MyChart) OR . A paper copy in the mail If you have any lab test that is abnormal or we need to change your treatment, we will call you to review the results.   Testing/Procedures: None ordered   Follow-Up: At [facility], you and your health needs are our priority.  As part of our continuing mission to provide you with exceptional heart care, we have created designated Provider Care Teams.  These Care Teams include your primary Cardiologist (physician) and Advanced Practice Providers (APPs -  Physician Assistants and Nurse Practitioners) who all work together to provide you with the care you need, when you need it.  We recommend signing up for the patient portal called "MyChart".  Sign up information is provided on this After Visit Summary.  MyChart is used to connect with patients for Virtual Visits (Telemedicine).  Patients are able to view lab/test results, encounter notes, upcoming appointments, etc.  Non-urgent messages can be sent to your provider as well.   To learn more about what you can do with MyChart, go to [email or web address].    Your next appointment:   3 month(s) on a device day  The format for your next appointment:   In Person  Provider:   Sanda Klein, MD   Other Instructions Dr. Sallyanne Kuster would like you to check your blood pressure daily for the next 2 weeks.  Keep a journal of these daily blood pressure and heart rate readings and call our office or send a message through [place] with the results. Thank you!

## 2019-10-10 ENCOUNTER — Encounter: Payer: Self-pay | Admitting: Cardiovascular Disease

## 2019-10-22 ENCOUNTER — Ambulatory Visit (INDEPENDENT_AMBULATORY_CARE_PROVIDER_SITE_OTHER): Payer: Medicare Other

## 2019-10-22 DIAGNOSIS — I472 Ventricular tachycardia, unspecified: Secondary | ICD-10-CM

## 2019-10-23 LAB — CUP PACEART REMOTE DEVICE CHECK
Battery Remaining Longevity: 138 mo
Battery Remaining Percentage: 100 %
Brady Statistic RV Percent Paced: 0 %
Date Time Interrogation Session: 20211006123500
HighPow Impedance: 39 Ohm
Implantable Lead Implant Date: 20090120
Implantable Lead Location: 753860
Implantable Lead Model: 157
Implantable Lead Serial Number: 138237
Implantable Pulse Generator Implant Date: 20160126
Lead Channel Impedance Value: 523 Ohm
Lead Channel Pacing Threshold Amplitude: 0.8 V
Lead Channel Pacing Threshold Pulse Width: 0.5 ms
Lead Channel Setting Pacing Amplitude: 2.2 V
Lead Channel Setting Pacing Pulse Width: 0.5 ms
Lead Channel Setting Sensing Sensitivity: 0.6 mV
Pulse Gen Serial Number: 191956

## 2019-10-27 NOTE — Progress Notes (Signed)
Remote ICD transmission.   

## 2019-11-14 ENCOUNTER — Encounter (HOSPITAL_COMMUNITY): Payer: Self-pay | Admitting: *Deleted

## 2019-11-14 ENCOUNTER — Emergency Department (HOSPITAL_COMMUNITY): Payer: Medicare Other

## 2019-11-14 ENCOUNTER — Inpatient Hospital Stay (HOSPITAL_COMMUNITY)
Admission: EM | Admit: 2019-11-14 | Discharge: 2019-11-22 | DRG: 392 | Disposition: A | Discharge status: Home Health | Payer: Medicare Other | Attending: Internal Medicine | Admitting: Internal Medicine

## 2019-11-14 ENCOUNTER — Other Ambulatory Visit: Payer: Self-pay

## 2019-11-14 DIAGNOSIS — I2510 Atherosclerotic heart disease of native coronary artery without angina pectoris: Secondary | ICD-10-CM | POA: Diagnosis present

## 2019-11-14 DIAGNOSIS — K222 Esophageal obstruction: Secondary | ICD-10-CM | POA: Diagnosis present

## 2019-11-14 DIAGNOSIS — E119 Type 2 diabetes mellitus without complications: Secondary | ICD-10-CM

## 2019-11-14 DIAGNOSIS — R823 Hemoglobinuria: Secondary | ICD-10-CM | POA: Diagnosis present

## 2019-11-14 DIAGNOSIS — Z66 Do not resuscitate: Secondary | ICD-10-CM | POA: Diagnosis present

## 2019-11-14 DIAGNOSIS — K5900 Constipation, unspecified: Secondary | ICD-10-CM | POA: Diagnosis present

## 2019-11-14 DIAGNOSIS — I480 Paroxysmal atrial fibrillation: Secondary | ICD-10-CM | POA: Diagnosis present

## 2019-11-14 DIAGNOSIS — R0902 Hypoxemia: Secondary | ICD-10-CM

## 2019-11-14 DIAGNOSIS — Z8262 Family history of osteoporosis: Secondary | ICD-10-CM

## 2019-11-14 DIAGNOSIS — R0789 Other chest pain: Secondary | ICD-10-CM

## 2019-11-14 DIAGNOSIS — R1084 Generalized abdominal pain: Secondary | ICD-10-CM

## 2019-11-14 DIAGNOSIS — Z86718 Personal history of other venous thrombosis and embolism: Secondary | ICD-10-CM

## 2019-11-14 DIAGNOSIS — R1012 Left upper quadrant pain: Secondary | ICD-10-CM | POA: Diagnosis not present

## 2019-11-14 DIAGNOSIS — E871 Hypo-osmolality and hyponatremia: Secondary | ICD-10-CM | POA: Diagnosis present

## 2019-11-14 DIAGNOSIS — M549 Dorsalgia, unspecified: Secondary | ICD-10-CM | POA: Diagnosis present

## 2019-11-14 DIAGNOSIS — Z823 Family history of stroke: Secondary | ICD-10-CM

## 2019-11-14 DIAGNOSIS — N1832 Chronic kidney disease, stage 3b: Secondary | ICD-10-CM | POA: Diagnosis present

## 2019-11-14 DIAGNOSIS — K219 Gastro-esophageal reflux disease without esophagitis: Secondary | ICD-10-CM | POA: Diagnosis present

## 2019-11-14 DIAGNOSIS — F419 Anxiety disorder, unspecified: Secondary | ICD-10-CM | POA: Diagnosis present

## 2019-11-14 DIAGNOSIS — R140 Abdominal distension (gaseous): Secondary | ICD-10-CM | POA: Diagnosis present

## 2019-11-14 DIAGNOSIS — Z20822 Contact with and (suspected) exposure to covid-19: Secondary | ICD-10-CM | POA: Diagnosis present

## 2019-11-14 DIAGNOSIS — Z825 Family history of asthma and other chronic lower respiratory diseases: Secondary | ICD-10-CM

## 2019-11-14 DIAGNOSIS — Z8249 Family history of ischemic heart disease and other diseases of the circulatory system: Secondary | ICD-10-CM

## 2019-11-14 DIAGNOSIS — F32A Depression, unspecified: Secondary | ICD-10-CM | POA: Diagnosis present

## 2019-11-14 DIAGNOSIS — E039 Hypothyroidism, unspecified: Secondary | ICD-10-CM | POA: Diagnosis present

## 2019-11-14 DIAGNOSIS — R7401 Elevation of levels of liver transaminase levels: Secondary | ICD-10-CM | POA: Diagnosis not present

## 2019-11-14 DIAGNOSIS — K5790 Diverticulosis of intestine, part unspecified, without perforation or abscess without bleeding: Secondary | ICD-10-CM | POA: Diagnosis present

## 2019-11-14 DIAGNOSIS — Z91041 Radiographic dye allergy status: Secondary | ICD-10-CM

## 2019-11-14 DIAGNOSIS — Z7189 Other specified counseling: Secondary | ICD-10-CM

## 2019-11-14 DIAGNOSIS — I130 Hypertensive heart and chronic kidney disease with heart failure and stage 1 through stage 4 chronic kidney disease, or unspecified chronic kidney disease: Secondary | ICD-10-CM | POA: Diagnosis present

## 2019-11-14 DIAGNOSIS — I25118 Atherosclerotic heart disease of native coronary artery with other forms of angina pectoris: Secondary | ICD-10-CM | POA: Diagnosis present

## 2019-11-14 DIAGNOSIS — Z7901 Long term (current) use of anticoagulants: Secondary | ICD-10-CM

## 2019-11-14 DIAGNOSIS — N179 Acute kidney failure, unspecified: Secondary | ICD-10-CM | POA: Diagnosis present

## 2019-11-14 DIAGNOSIS — Z79899 Other long term (current) drug therapy: Secondary | ICD-10-CM

## 2019-11-14 DIAGNOSIS — I252 Old myocardial infarction: Secondary | ICD-10-CM

## 2019-11-14 DIAGNOSIS — E785 Hyperlipidemia, unspecified: Secondary | ICD-10-CM | POA: Diagnosis present

## 2019-11-14 DIAGNOSIS — I10 Essential (primary) hypertension: Secondary | ICD-10-CM | POA: Diagnosis present

## 2019-11-14 DIAGNOSIS — Z83438 Family history of other disorder of lipoprotein metabolism and other lipidemia: Secondary | ICD-10-CM

## 2019-11-14 DIAGNOSIS — R109 Unspecified abdominal pain: Secondary | ICD-10-CM

## 2019-11-14 DIAGNOSIS — H269 Unspecified cataract: Secondary | ICD-10-CM | POA: Diagnosis present

## 2019-11-14 DIAGNOSIS — K2100 Gastro-esophageal reflux disease with esophagitis, without bleeding: Secondary | ICD-10-CM | POA: Diagnosis present

## 2019-11-14 DIAGNOSIS — R1013 Epigastric pain: Secondary | ICD-10-CM | POA: Diagnosis not present

## 2019-11-14 DIAGNOSIS — Z91040 Latex allergy status: Secondary | ICD-10-CM

## 2019-11-14 DIAGNOSIS — Z87442 Personal history of urinary calculi: Secondary | ICD-10-CM

## 2019-11-14 DIAGNOSIS — Z882 Allergy status to sulfonamides status: Secondary | ICD-10-CM

## 2019-11-14 DIAGNOSIS — Z833 Family history of diabetes mellitus: Secondary | ICD-10-CM

## 2019-11-14 DIAGNOSIS — R531 Weakness: Secondary | ICD-10-CM

## 2019-11-14 DIAGNOSIS — E1151 Type 2 diabetes mellitus with diabetic peripheral angiopathy without gangrene: Secondary | ICD-10-CM | POA: Diagnosis present

## 2019-11-14 DIAGNOSIS — Z79890 Hormone replacement therapy (postmenopausal): Secondary | ICD-10-CM

## 2019-11-14 DIAGNOSIS — Z95810 Presence of automatic (implantable) cardiac defibrillator: Secondary | ICD-10-CM

## 2019-11-14 DIAGNOSIS — H3530 Unspecified macular degeneration: Secondary | ICD-10-CM | POA: Diagnosis present

## 2019-11-14 DIAGNOSIS — D649 Anemia, unspecified: Secondary | ICD-10-CM | POA: Diagnosis present

## 2019-11-14 DIAGNOSIS — K760 Fatty (change of) liver, not elsewhere classified: Secondary | ICD-10-CM | POA: Diagnosis present

## 2019-11-14 DIAGNOSIS — Z888 Allergy status to other drugs, medicaments and biological substances status: Secondary | ICD-10-CM

## 2019-11-14 DIAGNOSIS — E869 Volume depletion, unspecified: Secondary | ICD-10-CM | POA: Diagnosis present

## 2019-11-14 DIAGNOSIS — R1011 Right upper quadrant pain: Secondary | ICD-10-CM

## 2019-11-14 DIAGNOSIS — R42 Dizziness and giddiness: Secondary | ICD-10-CM

## 2019-11-14 DIAGNOSIS — Z8049 Family history of malignant neoplasm of other genital organs: Secondary | ICD-10-CM

## 2019-11-14 DIAGNOSIS — K449 Diaphragmatic hernia without obstruction or gangrene: Secondary | ICD-10-CM | POA: Diagnosis present

## 2019-11-14 DIAGNOSIS — Z8371 Family history of colonic polyps: Secondary | ICD-10-CM

## 2019-11-14 DIAGNOSIS — I5022 Chronic systolic (congestive) heart failure: Secondary | ICD-10-CM | POA: Diagnosis present

## 2019-11-14 DIAGNOSIS — Z91048 Other nonmedicinal substance allergy status: Secondary | ICD-10-CM

## 2019-11-14 DIAGNOSIS — R197 Diarrhea, unspecified: Secondary | ICD-10-CM | POA: Diagnosis not present

## 2019-11-14 DIAGNOSIS — Z881 Allergy status to other antibiotic agents status: Secondary | ICD-10-CM

## 2019-11-14 DIAGNOSIS — I951 Orthostatic hypotension: Secondary | ICD-10-CM | POA: Diagnosis present

## 2019-11-14 DIAGNOSIS — R935 Abnormal findings on diagnostic imaging of other abdominal regions, including retroperitoneum: Secondary | ICD-10-CM

## 2019-11-14 DIAGNOSIS — M1990 Unspecified osteoarthritis, unspecified site: Secondary | ICD-10-CM | POA: Diagnosis present

## 2019-11-14 DIAGNOSIS — G8929 Other chronic pain: Secondary | ICD-10-CM | POA: Diagnosis present

## 2019-11-14 DIAGNOSIS — I82409 Acute embolism and thrombosis of unspecified deep veins of unspecified lower extremity: Secondary | ICD-10-CM | POA: Diagnosis present

## 2019-11-14 DIAGNOSIS — E1122 Type 2 diabetes mellitus with diabetic chronic kidney disease: Secondary | ICD-10-CM | POA: Diagnosis present

## 2019-11-14 DIAGNOSIS — R6881 Early satiety: Secondary | ICD-10-CM | POA: Diagnosis present

## 2019-11-14 LAB — COMPREHENSIVE METABOLIC PANEL
ALT: 33 U/L (ref 0–44)
AST: 41 U/L (ref 15–41)
Albumin: 3.9 g/dL (ref 3.5–5.0)
Alkaline Phosphatase: 72 U/L (ref 38–126)
Anion gap: 10 (ref 5–15)
BUN: 23 mg/dL (ref 8–23)
CO2: 26 mmol/L (ref 22–32)
Calcium: 9.4 mg/dL (ref 8.9–10.3)
Chloride: 97 mmol/L — ABNORMAL LOW (ref 98–111)
Creatinine, Ser: 1.55 mg/dL — ABNORMAL HIGH (ref 0.44–1.00)
GFR, Estimated: 32 mL/min — ABNORMAL LOW (ref >60)
Glucose, Bld: 121 mg/dL — ABNORMAL HIGH (ref 70–99)
Potassium: 3.8 mmol/L (ref 3.5–5.1)
Sodium: 133 mmol/L — ABNORMAL LOW (ref 135–145)
Total Bilirubin: 0.7 mg/dL (ref 0.3–1.2)
Total Protein: 7.1 g/dL (ref 6.5–8.1)

## 2019-11-14 LAB — CBC WITH DIFFERENTIAL/PLATELET
Abs Immature Granulocytes: 0.01 10*3/uL (ref 0.00–0.07)
Basophils Absolute: 0.1 10*3/uL (ref 0.0–0.1)
Basophils Relative: 1 %
Eosinophils Absolute: 0.3 10*3/uL (ref 0.0–0.5)
Eosinophils Relative: 6 %
HCT: 39.7 % (ref 36.0–46.0)
Hemoglobin: 12.8 g/dL (ref 12.0–15.0)
Immature Granulocytes: 0 %
Lymphocytes Relative: 20 %
Lymphs Abs: 1.1 10*3/uL (ref 0.7–4.0)
MCH: 31.2 pg (ref 26.0–34.0)
MCHC: 32.2 g/dL (ref 30.0–36.0)
MCV: 96.8 fL (ref 80.0–100.0)
Monocytes Absolute: 0.6 10*3/uL (ref 0.1–1.0)
Monocytes Relative: 10 %
Neutro Abs: 3.4 10*3/uL (ref 1.7–7.7)
Neutrophils Relative %: 63 %
Platelets: 249 10*3/uL (ref 150–400)
RBC: 4.1 MIL/uL (ref 3.87–5.11)
RDW: 13.5 % (ref 11.5–15.5)
WBC: 5.5 10*3/uL (ref 4.0–10.5)
nRBC: 0 % (ref 0.0–0.2)

## 2019-11-14 LAB — URINALYSIS, ROUTINE W REFLEX MICROSCOPIC
Bacteria, UA: NONE SEEN
Bilirubin Urine: NEGATIVE
Glucose, UA: NEGATIVE mg/dL
Ketones, ur: NEGATIVE mg/dL
Leukocytes,Ua: NEGATIVE
Nitrite: NEGATIVE
Protein, ur: NEGATIVE mg/dL
Specific Gravity, Urine: 1.003 — ABNORMAL LOW (ref 1.005–1.030)
pH: 8 (ref 5.0–8.0)

## 2019-11-14 LAB — CBG MONITORING, ED: Glucose-Capillary: 101 mg/dL — ABNORMAL HIGH (ref 70–99)

## 2019-11-14 LAB — BRAIN NATRIURETIC PEPTIDE: B Natriuretic Peptide: 109 pg/mL — ABNORMAL HIGH (ref 0.0–100.0)

## 2019-11-14 LAB — ETHANOL: Alcohol, Ethyl (B): <10 mg/dL (ref <10)

## 2019-11-14 LAB — PROTIME-INR
INR: 1.3 — ABNORMAL HIGH (ref 0.8–1.2)
Prothrombin Time: 15.3 seconds — ABNORMAL HIGH (ref 11.4–15.2)

## 2019-11-14 LAB — TROPONIN I (HIGH SENSITIVITY): Troponin I (High Sensitivity): 10 ng/L (ref <18)

## 2019-11-14 LAB — MAGNESIUM: Magnesium: 2.2 mg/dL (ref 1.7–2.4)

## 2019-11-14 MED ORDER — ASPIRIN 81 MG PO CHEW: 324.0000 mg | CHEWABLE_TABLET | Freq: Once | ORAL | Status: DC

## 2019-11-14 NOTE — ED Notes (Signed)
Pt reports she has been dizzy with the urge to defecate x 1 week  She reports she was seen at [facility] earlier this week   Has an appt with her PCP on Monday   Here for eval

## 2019-11-14 NOTE — ED Provider Notes (Signed)
[facility] EMERGENCY DEPARTMENT Provider Note   CSN: [id number] Arrival date & time: 11/14/19  2121     History Chief Complaint  Patient presents with  . Weakness    [name] is a 86 y.o. female.  HPI  Patient presents with concern of dizziness, possibly chest pain, possible abdominal pain. Patient is a tangential historian.  She arrives via EMS, and history is initially obtained by those individuals as well as by the patient herself. It seems as though the patient has had a dizziness for weeks, possibly months.  This has been largely unchanged, but she may have recently developed weakness.  Today, she tells that she felt particularly weak, dizzy, possibly had an episode of chest pain, called EMS.  On arrival here patient notes that she also has abdominal fullness, has had diminished bowel movements recently.  By the time my interview she notes that her chest pain is no longer present. EMS notes that the patient was anxious in route, hypertensive with a systolic greater than 867, though this improved without clear intervention. Patient states that she has been taking all medication as directed.  Past Medical History:  Diagnosis Date  . AICD (automatic cardioverter/defibrillator) present   . Allergy    SESONAL  . Anxiety   . ARTHRITIS   . Arthritis   . Atrial fibrillation ([place])   . CAD (coronary artery disease)    a. history of cardiac arrest 1993;  b. s/p LAD/LCX stenting in 2003;  c. 07/2011 Cath: LM nl, LAD patent stent, D1 80ost, LCX patent stent, RCA min irregs;  d. 04/2012 MV: EF 66%, no ishcemia;  e. 12/2013 Echo: Ef 55%, no rwma, Gr 1 DD, triv AI/MR, mildly dil LA;  f. 12/2013 Lexi MV: intermediate risk - apical ischemia and inf/infsept fixed defect, ? artifactual.  . CAD in native artery 12/31/2013   Previous stents to LAD and Ramus patent on cath today 12/31/13 also There is severe disease in the ostial first diagonal which is unchanged from most recent cardiac  catheterization. The right coronary artery could not be engaged selectively but nonselective angiography showed no significant disease in the proximal and midsegment.     . Cataract    DENIES  . Chronic back pain   . Chronic sinus bradycardia   . CKD (chronic kidney disease), stage III ([place])   . Clotting disorder (HCC)    DVT  . COLITIS 12/02/2007   Qualifier: Diagnosis of  By: Nils Pyle CMA ([place]), [name]    . Diabetes mellitus without complication ([place])    DENIES  . Diverticulosis of colon (without mention of hemorrhage) 2007   Colonoscopy   . Esophageal stricture    a. 2012 s/p dil.  . Esophagitis, unspecified    a. 2012 EGD  . EXTERNAL HEMORRHOIDS   . GERD (gastroesophageal reflux disease)    omeprazole  . H/O hiatal hernia   . Hiatal hernia    a. 2012 EGD.  Marland Kitchen History of DVT in the past, not on Coumadin now    left  leg  . HYPERCHOLESTEROLEMIA   . Hypertension   . ICD (implantable cardiac defibrillator) in place    a. s/p initial ICD in 1993 in setting of cardiac arrest;  b. 01/2007 gen change: Guidant T135 Vitality DS VR single lead ICD.  . Macular degeneration    gets injecton in eye every 5 weeks- last injection - 05/03/2013   . Myocardial infarction ([place]) 1993  . Near syncope 05/22/2015  .  Nephrolithiasis, just saw Dr [name]   . Orthostatic hypotension   . Peripheral vascular disease ([place])    ???  . RECTAL BLEEDING 12/03/2007   Qualifier: Diagnosis of  By: Sharlett Iles MD Byrd Hesselbach   . Unspecified gastritis and gastroduodenitis without mention of hemorrhage    a. 2003 EGD->not noted on 2012 EGD.  Marland Kitchen Unstable angina, neg MI, cath stable.maybe GI 12/30/2013    Patient Active Problem List   Diagnosis Date Noted  . TSH elevation 08/01/2019  . Hospital discharge follow-up 08/01/2019  . Exudative age-related macular degeneration of right eye with active choroidal neovascularization ([place]) 04/30/2019  . Retinal hemorrhage of right eye 04/30/2019  . Advanced  nonexudative age-related macular degeneration of left eye with subfoveal involvement 04/30/2019  . Exudative age-related macular degeneration of left eye with inactive choroidal neovascularization ([place]) 04/30/2019  . Advanced nonexudative age-related macular degeneration of right eye without subfoveal involvement 04/30/2019  . Posterior vitreous detachment of both eyes 04/30/2019  . Secondary hypercoagulable state ([place]) 03/11/2019  . Persistent atrial fibrillation ([place])   . Proctitis 01/09/2019  . Atrial fibrillation, chronic ([place]) 01/09/2019  . Hyperlipidemia 01/09/2019  . Nausea and vomiting 01/09/2019  . Hyperglycemia 01/09/2019  . COVID-19 virus infection 12/28/2018  . Chronic diarrhea 09/18/2018  . Abdominal pain 09/18/2018  . Osteoarthritis of right knee 03/29/2018  . GERD (gastroesophageal reflux disease)   . Diarrhea of presumed infectious origin 02/06/2018  . Essential hypertension 12/21/2017  . Degenerative scoliosis 02/19/2017  . Burst fracture of lumbar vertebra ([place]) 02/19/2017  . History of drug-induced prolonged QT interval with torsade de pointes 11/01/2016  . Iron deficiency anemia 10/30/2016  . Long term current use of anticoagulant 02/17/2016  . Near syncope 05/22/2015  . Diabetes mellitus type 2, diet-controlled ([place]) 09/14/2014  . ICD (implantable cardioverter-defibrillator) battery depletion 01/20/2014  . Abnormal nuclear stress test, 12/30/13 12/31/2013  . Coronary artery disease involving native coronary artery of native heart without angina pectoris 12/31/2013  . Paroxysmal atrial fibrillation, chads2 Vasc2 score of 5, on eliquis 10/16/2013  . Catecholaminergic polymorphic ventricular tachycardia ([place]) 10/16/2013  . Ureteral stone with hydronephrosis 05/20/2013  . Metabolic syndrome 78/67/6720  . Orthostatic hypotension 07/27/2011  . Chest pain, r/o cardiac, negative MI 07/25/2011  . Weakness 07/25/2011    Class: Acute  . Chronic sinus bradycardia  07/25/2011  . ICD (implantable cardioverter-defibrillator) in place 07/25/2011  . CKD (chronic kidney disease) stage 3, GFR 30-59 ml/min (HCC) 07/25/2011    Class: Chronic  . History of DVT in the past, on eliquis now 07/25/2011    Class: History of  . Nephrolithiasis, just saw Dr [name] 07/25/2011    Class: History of  . DYSPHAGIA 03/24/2010  . Chronic ischemic heart disease 12/03/2007  . Irritable bowel syndrome with diarrhea 12/03/2007  . EPIGASTRIC PAIN 12/03/2007  . Hyperlipidemia associated with type 2 diabetes mellitus ([place]) 12/02/2007    Class: History of  . EXTERNAL HEMORRHOIDS 12/02/2007    Class: History of  . ESOPHAGITIS 12/02/2007  . GASTRITIS 12/02/2007  . DIVERTICULOSIS, COLON 12/02/2007  . ARTHRITIS 12/02/2007    Class: Chronic    Past Surgical History:  Procedure Laterality Date  . BREAST BIOPSY Left 11/2013  . CARDIAC CATHETERIZATION    . CARDIAC CATHETERIZATION  01/01/15   patent stents -there is severe disease in ostial 1st diag but without change.   Marland Kitchen CARDIAC DEFIBRILLATOR PLACEMENT  02/05/2007   Guidant  . CARDIOVASCULAR STRESS TEST  12/30/2013   abnormal  .  CARDIOVERSION N/A 02/13/2019   Procedure: CARDIOVERSION;  Surgeon: Sanda Klein, MD;  Location: [facility];  Service: Cardiovascular;  Laterality: N/A;  . CATARACT EXTRACTION Right    Dr. Katy Fitch  . COLONOSCOPY    . coronary stents     . CYSTOSCOPY WITH URETEROSCOPY Right 05/20/2013   Procedure: CYSTOSCOPY, RIGHT URETEROSCOPY STONE EXTRACTION, Insertion of right DOUBLE J STENT ;  Surgeon: Irine Seal, MD;  Location: [facility];  Service: Urology;  Laterality: Right;  . CYSTOSCOPY WITH URETEROSCOPY AND STENT PLACEMENT N/A 06/03/2013   Procedure: SECOND LOOK CYSTOSCOPY WITH URETEROSCOPY  HOLMIUM LASER LITHO AND STONE EXTRACTION Sammie Bench ;  Surgeon: Malka So, MD;  Location: [facility];  Service: Urology;  Laterality: N/A;  . GIVENS CAPSULE STUDY N/A 10/30/2016   Procedure: GIVENS CAPSULE STUDY;   Surgeon: Irene Shipper, MD;  Location: [place] [place];  Service: Endoscopy;  Laterality: N/A;  NEEDS TO BE ADMITTED FOR OBSERVATION PT. HAS DEFIB  . HEMORROIDECTOMY  80's  . HOLMIUM LASER APPLICATION Right 03/21/1441   Procedure: HOLMIUM LASER APPLICATION;  Surgeon: Irine Seal, MD;  Location: [facility];  Service: Urology;  Laterality: Right;  . HYSTEROSCOPY WITH D & C  09/02/2010   Procedure: DILATATION AND CURETTAGE (D&C) /HYSTEROSCOPY;  Surgeon: Margarette Asal;  Location: [facility];  Service: Gynecology;  Laterality: N/A;  Dilation and Curettage with Hysteroscopy and Polypectomy  . IMPLANTABLE CARDIOVERTER DEFIBRILLATOR (ICD) GENERATOR CHANGE N/A 02/10/2014   Procedure: ICD GENERATOR CHANGE;  Surgeon: Sanda Klein, MD;  Location: [facility] C[facility] LAB;  Service: Cardiovascular;  Laterality: N/A;  . LEFT HEART CATHETERIZATION WITH CORONARY ANGIOGRAM N/A 07/28/2011   Procedure: LEFT HEART CATHETERIZATION WITH CORONARY ANGIOGRAM;  Surgeon: Lorretta Harp, MD;  [facility];  Service: Cardiovascular;  Laterality: N/A;  . LEFT HEART CATHETERIZATION WITH CORONARY ANGIOGRAM N/A 12/31/2013   Procedure: LEFT HEART CATHETERIZATION WITH CORONARY ANGIOGRAM;  Surgeon: Wellington Hampshire, MD;  Location: [facility] CATH LAB;  Service: Cardiovascular;  Laterality: N/A;     OB History   No obstetric history on file.     Family History  Problem Relation Age of Onset  . Stroke Mother   . Other Mother        brain tumor  . Other Father        MI  . Heart attack Father   . Stroke Sister   . Macular degeneration Sister   . Diabetes Daughter   . Cancer Daughter        ovarian  . Colon polyps Daughter   . Atrial fibrillation Sister   . Hyperlipidemia Sister   . Osteoporosis Sister   . Stroke Sister   . Uterine cancer Sister   . Heart attack Brother   . Heart disease Brother   . Asthma Brother   . Breast cancer Neg Hx     Social History   Tobacco Use  . Smoking status: Never Smoker  . Smokeless tobacco:  Never Used  Vaping Use  . Vaping Use: Never used  Substance Use Topics  . Alcohol use: No  . Drug use: No    Home Medications Prior to Admission medications   Medication Sig Start Date End Date Taking? Authorizing Provider  amiodarone (PACERONE) 100 MG tablet Take 1 tablet (100 mg total) by mouth daily. 09/30/19   Croitoru, Mihai, MD  apixaban (ELIQUIS) 5 MG TABS tablet Take 1 tablet (5 mg total) by mouth 2 (two) times daily. 10/07/19   Croitoru, Dani Gobble, MD  carboxymethylcellulose (REFRESH PLUS) 0.5 % SOLN Place 1 drop into both eyes 3 (three) times daily as needed (dry/irritated eyes.).    [provider]  diphenoxylate-atropine (LOMOTIL) 2.5-0.025 MG tablet TAKE 1 TABLET EVERY 8 HOURS AS NEEDED FOR DIARRHEA Patient taking differently: Take 1 tablet by mouth daily.  03/25/19   [name], DO  hydrALAZINE (APRESOLINE) 10 MG tablet TAKE 1 TABLET EVERY 8 HOURS 10/09/19   Croitoru, Mihai, MD  isosorbide mononitrate (IMDUR) 30 MG 24 hr tablet TAKE ONE HALF (1/2) TABLET DAILY 09/23/19   Croitoru, Dani Gobble, MD  levothyroxine (SYNTHROID) 75 MCG tablet Take 1 tablet (75 mcg total) by mouth daily before breakfast. 09/01/19   Ronnie Doss M, DO  loratadine (CLARITIN) 10 MG tablet Take 1 tablet (10 mg total) by mouth daily. (for itching) Patient taking differently: Take 10 mg by mouth as needed for itching.  01/20/19   [name], DO  meclizine (ANTIVERT) 25 MG tablet Take 25 mg by mouth 3 (three) times daily as needed for dizziness or nausea.  07/22/19   [provider]  metoprolol tartrate (LOPRESSOR) 25 MG tablet TAKE  (1)  TABLET TWICE A DAY. Patient taking differently: As needed 09/23/19   Croitoru, Mihai, MD  Multiple Vitamins-Minerals (CENTRUM SILVER ULTRA WOMENS PO) Take 1 tablet by mouth daily.    [provider]  nitroGLYCERIN (NITROSTAT) 0.4 MG SL tablet Place 1 tablet (0.4 mg total) under the tongue every 5 (five) minutes as needed for chest pain. 12/20/18    Claretta Fraise, MD  pantoprazole (PROTONIX) 40 MG tablet Take 1 tablet (40 mg total) by mouth every evening. Patient taking differently: Take 40 mg by mouth daily.  02/21/19   Ronnie Doss M, DO  PARoxetine (PAXIL) 20 MG tablet TAKE (1) TABLET DAILY IN THE MORNING. Patient taking differently: Take 20 mg by mouth daily.  05/19/19   [name], DO  rosuvastatin (CRESTOR) 40 MG tablet TAKE (1/2) TABLET DAILY. 10/06/19   [name], DO  torsemide (DEMADEX) 20 MG tablet Take 1 tablet (20 mg total) by mouth daily as needed (edema). 04/01/19   [name], DO  Vitamin D, Ergocalciferol, (DRISDOL) 1.25 MG (50000 UNIT) CAPS capsule TAKE 1 CAPSULE ONCE EVERY 7 DAYS 07/30/19   Ronnie Doss M, DO    Allergies    Sotalol, Bactrim [sulfamethoxazole-trimethoprim], Ciprofloxacin, Actos [pioglitazone], Adhesive [tape], Contrast media [iodinated diagnostic agents], Latex, and Lipitor [atorvastatin]  Review of Systems   Review of Systems  Constitutional:       Per HPI, otherwise negative  HENT:       Per HPI, otherwise negative  Respiratory:       Per HPI, otherwise negative  Cardiovascular:       Per HPI, otherwise negative  Gastrointestinal: Positive for abdominal pain and constipation. Negative for vomiting.  Endocrine:       Negative aside from HPI  Genitourinary:       Neg aside from HPI   Musculoskeletal:       Per HPI, otherwise negative  Skin: Negative.   Neurological: Positive for dizziness and light-headedness. Negative for syncope.  Psychiatric/Behavioral: The patient is nervous/anxious.     Physical Exam Updated Vital Signs BP (!) 192/88   Pulse 72   Temp (!) 97.5 F (36.4 C) (Oral)   Resp 17   Ht 5\' 3"  (1.6 m)   Wt 63.1 kg   SpO2 100%   BMI 24.64 kg/m   Physical Exam Vitals and  nursing note reviewed.  Constitutional:      Appearance: She is well-developed.     Comments: Elderly female awake and alert speaking clearly  HENT:     Head:  Normocephalic and atraumatic.  Eyes:     Conjunctiva/sclera: Conjunctivae normal.  Cardiovascular:     Rate and Rhythm: Normal rate and regular rhythm.  Pulmonary:     Effort: Pulmonary effort is normal. No respiratory distress.     Breath sounds: Normal breath sounds. No stridor.  Chest:    Abdominal:     General: There is no distension.     Tenderness: There is abdominal tenderness. There is guarding.  Skin:    General: Skin is warm and dry.  Neurological:     Mental Status: She is alert and oriented to person, place, and time.     Cranial Nerves: Cranial nerves are intact. No cranial nerve deficit.     Motor: Atrophy present. No tremor or abnormal muscle tone.     Coordination: Coordination normal.     ED Results / Procedures / Treatments   Labs (all labs ordered are listed, but only abnormal results are displayed) Labs Reviewed  COMPREHENSIVE METABOLIC PANEL - Abnormal; Notable for the following components:      Result Value   Sodium 133 (*)    Chloride 97 (*)    Glucose, Bld 121 (*)    Creatinine, Ser 1.55 (*)    GFR, Estimated 32 (*)    All other components within normal limits  BRAIN NATRIURETIC PEPTIDE - Abnormal; Notable for the following components:   B Natriuretic Peptide 109.0 (*)    All other components within normal limits  PROTIME-INR - Abnormal; Notable for the following components:   Prothrombin Time 15.3 (*)    INR 1.3 (*)    All other components within normal limits  URINALYSIS, ROUTINE W REFLEX MICROSCOPIC - Abnormal; Notable for the following components:   Color, Urine STRAW (*)    Specific Gravity, Urine 1.003 (*)    Hgb urine dipstick MODERATE (*)    All other components within normal limits  CBG MONITORING, ED - Abnormal; Notable for the following components:   Glucose-Capillary 101 (*)    All other components within normal limits  RESP PANEL BY RT PCR (RSV, FLU A&B, COVID)  ETHANOL  MAGNESIUM  CBC WITH DIFFERENTIAL/PLATELET  TROPONIN I  (HIGH SENSITIVITY)  TROPONIN I (HIGH SENSITIVITY)    EKG EKG Interpretation  Date/Time:  Friday November 14 2019 21:28:49 EDT Ventricular Rate:  76 PR Interval:    QRS Duration: 157 QT Interval:  420 QTC Calculation: 473 R Axis:   -42 Text Interpretation: VENTRICULAR PACED RHYTHM No significant change since last tracing Abnormal ECG Confirmed by Carmin Muskrat [phone]) on 11/14/2019 10:31:37 PM   Radiology DG Chest Portable 1 View  Result Date: 11/14/2019 CLINICAL DATA:  Chest pain EXAM: PORTABLE CHEST 1 VIEW COMPARISON:  Radiograph 07/24/2019 FINDINGS: Cardiac pacer/defibrillator device is again seen with leads in stable position towards the cardiac apex with additional lead seen projecting over the left upper quadrant. Some chronically coarsened interstitial changes and hyperexpansion are similar to prior. No consolidation, features of edema, pneumothorax, or effusion. Stable cardiomediastinal contours with a calcified aorta. The osseous structures appear diffusely demineralized which may limit detection of small or nondisplaced fractures. No acute osseous or soft tissue abnormality. IMPRESSION: 1. No acute cardiopulmonary abnormality. 2. Stable chronically coarsened interstitial changes and hyperexpansion. Electronically Signed   By: Elwin Sleight.D.  On: 11/14/2019 22:33    Procedures Procedures (including critical care time)  Medications Ordered in ED Medications - No data to display  ED Course  I have reviewed the triage vital signs and the nursing notes.  Pertinent labs & imaging results that were available during my care of the patient were reviewed by me and considered in my medical decision making (see chart for details).  On arrival, with broad differential given the patient's multiple complaints, patient had labs, x-ray ordered.  Patient was placed on continuous cardiac monitoring, pulse oximetry. On pulse oximetry showed 100% saturation with room air, patient had  paced rhythm on monitor, rate 70, abnormal. 11:30 PM Patient complains largely of abdominal pain.  I reviewed the patient's initial labs, notable for absence of leukocytosis, chemistry panel largely reassuring aside from mild dehydration. Patient now only complains of abdominal pain, though she reiterates that she has been weak and dizzy recently. No current chest pain. Given the patient's abdominal fullness, hematuria, the patient was CT scan performed.  Elderly female presents with ongoing weakness, dizziness, not seemingly change from baseline, but with new chest pain, abdominal pain.  Here she is awake, alert, mildly hypertensive, though without evidence for endorgan damage.  No early evidence for atypical ACS, however early evidence for CNS pathology.  Patient's evaluation remains in process, with CT scan pending.  Dr. Tomi Bamberger is aware of the patient.   Final Clinical Impression(s) / ED Diagnoses Final diagnoses:  Weakness  Generalized abdominal pain  Atypical chest pain     Carmin Muskrat, MD 11/14/19 2333

## 2019-11-14 NOTE — ED Triage Notes (Signed)
Pt brought in by [facility] for c/o weakness; pt c/o dizzinesss; 217/103 with ems en route

## 2019-11-14 NOTE — ED Notes (Signed)
Awaiting CT   Visitor at bedside   Report to [place], [place]

## 2019-11-15 DIAGNOSIS — Z7189 Other specified counseling: Secondary | ICD-10-CM

## 2019-11-15 DIAGNOSIS — I5022 Chronic systolic (congestive) heart failure: Secondary | ICD-10-CM | POA: Diagnosis present

## 2019-11-15 DIAGNOSIS — I130 Hypertensive heart and chronic kidney disease with heart failure and stage 1 through stage 4 chronic kidney disease, or unspecified chronic kidney disease: Secondary | ICD-10-CM | POA: Diagnosis present

## 2019-11-15 DIAGNOSIS — R935 Abnormal findings on diagnostic imaging of other abdominal regions, including retroperitoneum: Secondary | ICD-10-CM | POA: Diagnosis not present

## 2019-11-15 DIAGNOSIS — K222 Esophageal obstruction: Secondary | ICD-10-CM | POA: Diagnosis present

## 2019-11-15 DIAGNOSIS — Z86718 Personal history of other venous thrombosis and embolism: Secondary | ICD-10-CM | POA: Diagnosis not present

## 2019-11-15 DIAGNOSIS — I351 Nonrheumatic aortic (valve) insufficiency: Secondary | ICD-10-CM | POA: Diagnosis not present

## 2019-11-15 DIAGNOSIS — R1011 Right upper quadrant pain: Secondary | ICD-10-CM | POA: Diagnosis not present

## 2019-11-15 DIAGNOSIS — N179 Acute kidney failure, unspecified: Secondary | ICD-10-CM | POA: Diagnosis present

## 2019-11-15 DIAGNOSIS — E869 Volume depletion, unspecified: Secondary | ICD-10-CM | POA: Diagnosis present

## 2019-11-15 DIAGNOSIS — Z91040 Latex allergy status: Secondary | ICD-10-CM | POA: Diagnosis not present

## 2019-11-15 DIAGNOSIS — E119 Type 2 diabetes mellitus without complications: Secondary | ICD-10-CM

## 2019-11-15 DIAGNOSIS — I361 Nonrheumatic tricuspid (valve) insufficiency: Secondary | ICD-10-CM | POA: Diagnosis not present

## 2019-11-15 DIAGNOSIS — R1012 Left upper quadrant pain: Secondary | ICD-10-CM | POA: Diagnosis present

## 2019-11-15 DIAGNOSIS — R42 Dizziness and giddiness: Secondary | ICD-10-CM

## 2019-11-15 DIAGNOSIS — Z91041 Radiographic dye allergy status: Secondary | ICD-10-CM | POA: Diagnosis not present

## 2019-11-15 DIAGNOSIS — Z91048 Other nonmedicinal substance allergy status: Secondary | ICD-10-CM | POA: Diagnosis not present

## 2019-11-15 DIAGNOSIS — R079 Chest pain, unspecified: Secondary | ICD-10-CM | POA: Diagnosis not present

## 2019-11-15 DIAGNOSIS — E039 Hypothyroidism, unspecified: Secondary | ICD-10-CM | POA: Diagnosis present

## 2019-11-15 DIAGNOSIS — I480 Paroxysmal atrial fibrillation: Secondary | ICD-10-CM

## 2019-11-15 DIAGNOSIS — R0789 Other chest pain: Secondary | ICD-10-CM | POA: Diagnosis not present

## 2019-11-15 DIAGNOSIS — K760 Fatty (change of) liver, not elsewhere classified: Secondary | ICD-10-CM | POA: Diagnosis present

## 2019-11-15 DIAGNOSIS — Z881 Allergy status to other antibiotic agents status: Secondary | ICD-10-CM | POA: Diagnosis not present

## 2019-11-15 DIAGNOSIS — R109 Unspecified abdominal pain: Secondary | ICD-10-CM

## 2019-11-15 DIAGNOSIS — I2510 Atherosclerotic heart disease of native coronary artery without angina pectoris: Secondary | ICD-10-CM | POA: Diagnosis present

## 2019-11-15 DIAGNOSIS — R531 Weakness: Secondary | ICD-10-CM | POA: Diagnosis not present

## 2019-11-15 DIAGNOSIS — I25118 Atherosclerotic heart disease of native coronary artery with other forms of angina pectoris: Secondary | ICD-10-CM | POA: Diagnosis present

## 2019-11-15 DIAGNOSIS — K219 Gastro-esophageal reflux disease without esophagitis: Secondary | ICD-10-CM | POA: Diagnosis not present

## 2019-11-15 DIAGNOSIS — E1122 Type 2 diabetes mellitus with diabetic chronic kidney disease: Secondary | ICD-10-CM | POA: Diagnosis present

## 2019-11-15 DIAGNOSIS — Z882 Allergy status to sulfonamides status: Secondary | ICD-10-CM | POA: Diagnosis not present

## 2019-11-15 DIAGNOSIS — R1010 Upper abdominal pain, unspecified: Secondary | ICD-10-CM | POA: Diagnosis not present

## 2019-11-15 DIAGNOSIS — E871 Hypo-osmolality and hyponatremia: Secondary | ICD-10-CM | POA: Diagnosis present

## 2019-11-15 DIAGNOSIS — I252 Old myocardial infarction: Secondary | ICD-10-CM | POA: Diagnosis not present

## 2019-11-15 DIAGNOSIS — Z20822 Contact with and (suspected) exposure to covid-19: Secondary | ICD-10-CM | POA: Diagnosis present

## 2019-11-15 DIAGNOSIS — R1013 Epigastric pain: Secondary | ICD-10-CM | POA: Diagnosis present

## 2019-11-15 DIAGNOSIS — Z888 Allergy status to other drugs, medicaments and biological substances status: Secondary | ICD-10-CM | POA: Diagnosis not present

## 2019-11-15 DIAGNOSIS — R1084 Generalized abdominal pain: Secondary | ICD-10-CM | POA: Diagnosis not present

## 2019-11-15 DIAGNOSIS — N1832 Chronic kidney disease, stage 3b: Secondary | ICD-10-CM | POA: Diagnosis present

## 2019-11-15 DIAGNOSIS — Z87442 Personal history of urinary calculi: Secondary | ICD-10-CM | POA: Diagnosis not present

## 2019-11-15 DIAGNOSIS — Z66 Do not resuscitate: Secondary | ICD-10-CM | POA: Diagnosis present

## 2019-11-15 DIAGNOSIS — I10 Essential (primary) hypertension: Secondary | ICD-10-CM | POA: Diagnosis not present

## 2019-11-15 LAB — COMPREHENSIVE METABOLIC PANEL
ALT: 31 U/L (ref 0–44)
AST: 38 U/L (ref 15–41)
Albumin: 4 g/dL (ref 3.5–5.0)
Alkaline Phosphatase: 67 U/L (ref 38–126)
Anion gap: 13 (ref 5–15)
BUN: 21 mg/dL (ref 8–23)
CO2: 28 mmol/L (ref 22–32)
Calcium: 10.2 mg/dL (ref 8.9–10.3)
Chloride: 98 mmol/L (ref 98–111)
Creatinine, Ser: 1.35 mg/dL — ABNORMAL HIGH (ref 0.44–1.00)
GFR, Estimated: 38 mL/min — ABNORMAL LOW (ref >60)
Glucose, Bld: 115 mg/dL — ABNORMAL HIGH (ref 70–99)
Potassium: 3.9 mmol/L (ref 3.5–5.1)
Sodium: 139 mmol/L (ref 135–145)
Total Bilirubin: 0.9 mg/dL (ref 0.3–1.2)
Total Protein: 7.3 g/dL (ref 6.5–8.1)

## 2019-11-15 LAB — CBC WITH DIFFERENTIAL/PLATELET
Abs Immature Granulocytes: 0.02 10*3/uL (ref 0.00–0.07)
Basophils Absolute: 0.1 10*3/uL (ref 0.0–0.1)
Basophils Relative: 1 %
Eosinophils Absolute: 0.4 10*3/uL (ref 0.0–0.5)
Eosinophils Relative: 6 %
HCT: 38.2 % (ref 36.0–46.0)
Hemoglobin: 12.4 g/dL (ref 12.0–15.0)
Immature Granulocytes: 0 %
Lymphocytes Relative: 31 %
Lymphs Abs: 1.9 10*3/uL (ref 0.7–4.0)
MCH: 31.2 pg (ref 26.0–34.0)
MCHC: 32.5 g/dL (ref 30.0–36.0)
MCV: 96.2 fL (ref 80.0–100.0)
Monocytes Absolute: 0.8 10*3/uL (ref 0.1–1.0)
Monocytes Relative: 12 %
Neutro Abs: 3.1 10*3/uL (ref 1.7–7.7)
Neutrophils Relative %: 50 %
Platelets: 242 10*3/uL (ref 150–400)
RBC: 3.97 MIL/uL (ref 3.87–5.11)
RDW: 13.3 % (ref 11.5–15.5)
WBC: 6.2 10*3/uL (ref 4.0–10.5)
nRBC: 0 % (ref 0.0–0.2)

## 2019-11-15 LAB — GLUCOSE, CAPILLARY
Glucose-Capillary: 84 mg/dL (ref 70–99)
Glucose-Capillary: 125 mg/dL — ABNORMAL HIGH (ref 70–99)

## 2019-11-15 LAB — TROPONIN I (HIGH SENSITIVITY): Troponin I (High Sensitivity): 14 ng/L (ref <18)

## 2019-11-15 LAB — RESPIRATORY PANEL BY RT PCR (FLU A&B, COVID)
Influenza A by PCR: NEGATIVE
Influenza B by PCR: NEGATIVE
SARS Coronavirus 2 by RT PCR: NEGATIVE

## 2019-11-15 LAB — D-DIMER, QUANTITATIVE: D-Dimer, Quant: 0.37 ug/mL-FEU (ref 0.00–0.50)

## 2019-11-15 MED ORDER — LEVOTHYROXINE SODIUM 75 MCG PO TABS
75.0000 ug | ORAL_TABLET | Freq: Every day | ORAL | Status: DC
  Administered 2019-11-16 – 2019-11-22 (×7): 75 ug via ORAL
  Filled 2019-11-15 (×7): qty 1

## 2019-11-15 MED ORDER — METOPROLOL TARTRATE 5 MG/5ML IV SOLN: 5.0000 mg | Freq: Once | INTRAVENOUS | Status: DC

## 2019-11-15 MED ORDER — ROSUVASTATIN CALCIUM 20 MG PO TABS
20.0000 mg | ORAL_TABLET | Freq: Every day | ORAL | Status: DC
  Administered 2019-11-15 – 2019-11-22 (×8): 20 mg via ORAL
  Filled 2019-11-15 (×8): qty 1

## 2019-11-15 MED ORDER — PAROXETINE HCL 20 MG PO TABS
20.0000 mg | ORAL_TABLET | Freq: Every day | ORAL | Status: DC
  Administered 2019-11-15 – 2019-11-22 (×8): 20 mg via ORAL
  Filled 2019-11-15 (×8): qty 1

## 2019-11-15 MED ORDER — PROCHLORPERAZINE EDISYLATE 10 MG/2ML IJ SOLN: 5.0000 mg | Freq: Four times a day (QID) | INTRAMUSCULAR | Status: DC | PRN

## 2019-11-15 MED ORDER — PANTOPRAZOLE SODIUM 40 MG IV SOLR
40.0000 mg | INTRAVENOUS | Status: DC
  Administered 2019-11-15: 40 mg via INTRAVENOUS
  Filled 2019-11-15: qty 40

## 2019-11-15 MED ORDER — POLYETHYLENE GLYCOL 3350 17 G PO PACK
17.0000 g | PACK | Freq: Two times a day (BID) | ORAL | Status: DC
  Administered 2019-11-15 – 2019-11-16 (×3): 17 g via ORAL
  Filled 2019-11-15 (×3): qty 1

## 2019-11-15 MED ORDER — APIXABAN 5 MG PO TABS
5.0000 mg | ORAL_TABLET | Freq: Two times a day (BID) | ORAL | Status: DC
  Administered 2019-11-15 – 2019-11-18 (×6): 5 mg via ORAL
  Filled 2019-11-15 (×6): qty 1

## 2019-11-15 MED ORDER — FENTANYL CITRATE (PF) 100 MCG/2ML IJ SOLN
12.5000 ug | INTRAMUSCULAR | Status: DC | PRN
  Administered 2019-11-15: 12.5 ug via INTRAVENOUS
  Filled 2019-11-15: qty 2

## 2019-11-15 MED ORDER — SODIUM CHLORIDE 0.9 % IV SOLN
3.0000 g | Freq: Two times a day (BID) | INTRAVENOUS | Status: DC
  Administered 2019-11-15: 3 g via INTRAVENOUS
  Filled 2019-11-15 (×5): qty 8

## 2019-11-15 MED ORDER — FENTANYL CITRATE (PF) 100 MCG/2ML IJ SOLN
25.0000 ug | INTRAMUSCULAR | Status: DC | PRN
  Administered 2019-11-15 – 2019-11-17 (×2): 25 ug via INTRAVENOUS
  Filled 2019-11-15 (×2): qty 2

## 2019-11-15 MED ORDER — HYDRALAZINE HCL 25 MG PO TABS
25.0000 mg | ORAL_TABLET | Freq: Four times a day (QID) | ORAL | Status: DC | PRN
  Administered 2019-11-15 – 2019-11-17 (×2): 25 mg via ORAL
  Filled 2019-11-15 (×2): qty 1

## 2019-11-15 MED ORDER — HYDROCORTISONE ACETATE 25 MG RE SUPP
25.0000 mg | Freq: Two times a day (BID) | RECTAL | Status: DC | PRN
  Administered 2019-11-15: 25 mg via RECTAL
  Filled 2019-11-15: qty 1

## 2019-11-15 MED ORDER — MECLIZINE HCL 12.5 MG PO TABS
25.0000 mg | ORAL_TABLET | Freq: Three times a day (TID) | ORAL | Status: DC
  Administered 2019-11-15 – 2019-11-22 (×21): 25 mg via ORAL
  Filled 2019-11-15 (×21): qty 2

## 2019-11-15 MED ORDER — SODIUM CHLORIDE 0.9 % IV SOLN: 3.0000 g | Freq: Four times a day (QID) | INTRAVENOUS | Status: DC

## 2019-11-15 MED ORDER — PANTOPRAZOLE SODIUM 40 MG PO TBEC
40.0000 mg | DELAYED_RELEASE_TABLET | Freq: Every day | ORAL | Status: DC
  Administered 2019-11-15 – 2019-11-22 (×8): 40 mg via ORAL
  Filled 2019-11-15 (×8): qty 1

## 2019-11-15 MED ORDER — METOPROLOL TARTRATE 5 MG/5ML IV SOLN
5.0000 mg | Freq: Four times a day (QID) | INTRAVENOUS | Status: DC
  Administered 2019-11-15: 5 mg via INTRAVENOUS
  Filled 2019-11-15: qty 5

## 2019-11-15 MED ORDER — LORAZEPAM 0.5 MG PO TABS
0.2500 mg | ORAL_TABLET | Freq: Three times a day (TID) | ORAL | Status: DC | PRN
  Administered 2019-11-16: 0.25 mg via ORAL
  Filled 2019-11-15: qty 1

## 2019-11-15 MED ORDER — AMIODARONE HCL 200 MG PO TABS
200.0000 mg | ORAL_TABLET | Freq: Every day | ORAL | Status: DC
  Administered 2019-11-15 – 2019-11-22 (×8): 200 mg via ORAL
  Filled 2019-11-15 (×8): qty 1

## 2019-11-15 MED ORDER — LINACLOTIDE 145 MCG PO CAPS
145.0000 ug | ORAL_CAPSULE | Freq: Every day | ORAL | Status: DC
  Administered 2019-11-15 – 2019-11-17 (×3): 145 ug via ORAL
  Filled 2019-11-15 (×3): qty 1

## 2019-11-15 MED ORDER — ISOSORBIDE MONONITRATE ER 30 MG PO TB24
15.0000 mg | ORAL_TABLET | Freq: Every day | ORAL | Status: DC
  Administered 2019-11-15 – 2019-11-22 (×8): 15 mg via ORAL
  Filled 2019-11-15 (×8): qty 1

## 2019-11-15 MED ORDER — SODIUM CHLORIDE 0.9 % IV SOLN: INTRAVENOUS | Status: DC

## 2019-11-15 MED ORDER — HYDRALAZINE HCL 10 MG PO TABS
10.0000 mg | ORAL_TABLET | Freq: Three times a day (TID) | ORAL | Status: DC
  Administered 2019-11-15 – 2019-11-22 (×19): 10 mg via ORAL
  Filled 2019-11-15 (×20): qty 1

## 2019-11-15 NOTE — Consult Note (Signed)
[facility] SURGICAL CONSULTATION NOTE (initial) - cpt: [id number])   HISTORY OF PRESENT ILLNESS (HPI):  86 y.o. female was admitted via [facility] ED earlier today for evaluation of weakness and dissiness. Patient reports this has been going on for the last 2 weeks.  She admits to irregular bowel habits, burning/rectal discomfort and abdominal pain associated with defecation.  The patient also complains that she has been having abdominal pain with decreased appetite, early satiety, bloating sensation and change in bowel habits for the past 2 weeks.  She denies nausea, vomiting, diarrhea, melena or hematochezia.   Surgery is consulted by hospitalist physician Dr. Kathie Dike in this context for evaluation and management  of a report on CT raising the DDx of early appendicitis.  PAST MEDICAL HISTORY (PMH):  Past Medical History:  Diagnosis Date  . AICD (automatic cardioverter/defibrillator) present   . Allergy    SESONAL  . Anxiety   . ARTHRITIS   . Arthritis   . Atrial fibrillation ([place])   . CAD (coronary artery disease)    a. history of cardiac arrest 1993;  b. s/p LAD/LCX stenting in 2003;  c. 07/2011 Cath: LM nl, LAD patent stent, D1 80ost, LCX patent stent, RCA min irregs;  d. 04/2012 MV: EF 66%, no ishcemia;  e. 12/2013 Echo: Ef 55%, no rwma, Gr 1 DD, triv AI/MR, mildly dil LA;  f. 12/2013 Lexi MV: intermediate risk - apical ischemia and inf/infsept fixed defect, ? artifactual.  . CAD in native artery 12/31/2013   Previous stents to LAD and Ramus patent on cath today 12/31/13 also There is severe disease in the ostial first diagonal which is unchanged from most recent cardiac catheterization. The right coronary artery could not be engaged selectively but nonselective angiography showed no significant disease in the proximal and midsegment.     . Cataract    DENIES  . Chronic back pain   . Chronic sinus bradycardia   . CKD (chronic kidney disease), stage III ([place])   .  Clotting disorder (HCC)    DVT  . COLITIS 12/02/2007   Qualifier: Diagnosis of  By: Nils Pyle CMA ([place]), [name]    . Diabetes mellitus without complication ([place])    DENIES  . Diverticulosis of colon (without mention of hemorrhage) 2007   Colonoscopy   . Esophageal stricture    a. 2012 s/p dil.  . Esophagitis, unspecified    a. 2012 EGD  . EXTERNAL HEMORRHOIDS   . GERD (gastroesophageal reflux disease)    omeprazole  . H/O hiatal hernia   . Hiatal hernia    a. 2012 EGD.  Marland Kitchen History of DVT in the past, not on Coumadin now    left  leg  . HYPERCHOLESTEROLEMIA   . Hypertension   . ICD (implantable cardiac defibrillator) in place    a. s/p initial ICD in 1993 in setting of cardiac arrest;  b. 01/2007 gen change: Guidant T135 Vitality DS VR single lead ICD.  . Macular degeneration    gets injecton in eye every 5 weeks- last injection - 05/03/2013   . Myocardial infarction ([place]) 1993  . Near syncope 05/22/2015  . Nephrolithiasis, just saw Dr [name]   . Orthostatic hypotension   . Peripheral vascular disease ([place])    ???  . RECTAL BLEEDING 12/03/2007   Qualifier: Diagnosis of  By: Sharlett Iles MD Byrd Hesselbach   . Unspecified gastritis and gastroduodenitis without mention of hemorrhage    a. 2003 EGD->not noted on 2012  EGD.  . Unstable angina, neg MI, cath stable.maybe GI 12/30/2013     PAST SURGICAL HISTORY [facility]):  Past Surgical History:  Procedure Laterality Date  . BREAST BIOPSY Left 11/2013  . CARDIAC CATHETERIZATION    . CARDIAC CATHETERIZATION  01/01/15   patent stents -there is severe disease in ostial 1st diag but without change.   Marland Kitchen CARDIAC DEFIBRILLATOR PLACEMENT  02/05/2007   Guidant  . CARDIOVASCULAR STRESS TEST  12/30/2013   abnormal  . CARDIOVERSION N/A 02/13/2019   Procedure: CARDIOVERSION;  Surgeon: Sanda Klein, MD;  Location: [facility];  Service: Cardiovascular;  Laterality: N/A;  . CATARACT EXTRACTION Right    Dr. Katy Fitch  . COLONOSCOPY    . coronary  stents     . CYSTOSCOPY WITH URETEROSCOPY Right 05/20/2013   Procedure: CYSTOSCOPY, RIGHT URETEROSCOPY STONE EXTRACTION, Insertion of right DOUBLE J STENT ;  Surgeon: Irine Seal, MD;  Location: [facility];  Service: Urology;  Laterality: Right;  . CYSTOSCOPY WITH URETEROSCOPY AND STENT PLACEMENT N/A 06/03/2013   Procedure: SECOND LOOK CYSTOSCOPY WITH URETEROSCOPY  HOLMIUM LASER LITHO AND STONE EXTRACTION Sammie Bench ;  Surgeon: Malka So, MD;  Location: [facility];  Service: Urology;  Laterality: N/A;  . GIVENS CAPSULE STUDY N/A 10/30/2016   Procedure: GIVENS CAPSULE STUDY;  Surgeon: Irene Shipper, MD;  Location: [facility];  Service: Endoscopy;  Laterality: N/A;  NEEDS TO BE ADMITTED FOR OBSERVATION PT. HAS DEFIB  . HEMORROIDECTOMY  80's  . HOLMIUM LASER APPLICATION Right 01/21/1094   Procedure: HOLMIUM LASER APPLICATION;  Surgeon: Irine Seal, MD;  Location: [facility];  Service: Urology;  Laterality: Right;  . HYSTEROSCOPY WITH D & C  09/02/2010   Procedure: DILATATION AND CURETTAGE (D&C) /HYSTEROSCOPY;  Surgeon: Margarette Asal;  Location: [facility];  Service: Gynecology;  Laterality: N/A;  Dilation and Curettage with Hysteroscopy and Polypectomy  . IMPLANTABLE CARDIOVERTER DEFIBRILLATOR (ICD) GENERATOR CHANGE N/A 02/10/2014   Procedure: ICD GENERATOR CHANGE;  Surgeon: Sanda Klein, MD;  Location: [facility];  Service: Cardiovascular;  Laterality: N/A;  . LEFT HEART CATHETERIZATION WITH CORONARY ANGIOGRAM N/A 07/28/2011   Procedure: LEFT HEART CATHETERIZATION WITH CORONARY ANGIOGRAM;  Surgeon: Lorretta Harp, MD;  Location: [facility];  Service: Cardiovascular;  Laterality: N/A;  . LEFT HEART CATHETERIZATION WITH CORONARY ANGIOGRAM N/A 12/31/2013   Procedure: LEFT HEART CATHETERIZATION WITH CORONARY ANGIOGRAM;  Surgeon: Wellington Hampshire, MD;  Location: [facility] CATH LAB;  Service: Cardiovascular;  Laterality: N/A;     MEDICATIONS:  Prior to Admission medications   Medication Sig Start Date End Date Taking?  Authorizing Provider  apixaban (ELIQUIS) 5 MG TABS tablet Take 1 tablet (5 mg total) by mouth 2 (two) times daily. 10/07/19  Yes Croitoru, Mihai, MD  hydrALAZINE (APRESOLINE) 10 MG tablet TAKE 1 TABLET EVERY 8 HOURS 10/09/19  Yes Croitoru, Mihai, MD  isosorbide mononitrate (IMDUR) 30 MG 24 hr tablet TAKE ONE HALF (1/2) TABLET DAILY 09/23/19  Yes Croitoru, Mihai, MD  levothyroxine (SYNTHROID) 75 MCG tablet Take 1 tablet (75 mcg total) by mouth daily before breakfast. 09/01/19  Yes Gottschalk, Ashly M, DO  loratadine (CLARITIN) 10 MG tablet Take 1 tablet (10 mg total) by mouth daily. (for itching) Patient taking differently: Take 10 mg by mouth as needed for itching.  01/20/19  Yes Gottschalk, Leatrice Jewels M, DO  meclizine (ANTIVERT) 25 MG tablet Take 25 mg by mouth 3 (three) times daily as needed for dizziness or nausea.  07/22/19  Yes [provider]  metoprolol tartrate (LOPRESSOR) 25 MG tablet TAKE  (1)  TABLET TWICE A DAY. Patient taking differently: As needed 09/23/19  Yes Croitoru, Mihai, MD  nitroGLYCERIN (NITROSTAT) 0.4 MG SL tablet Place 1 tablet (0.4 mg total) under the tongue every 5 (five) minutes as needed for chest pain. 12/20/18  Yes Claretta Fraise, MD  pantoprazole (PROTONIX) 40 MG tablet Take 1 tablet (40 mg total) by mouth every evening. Patient taking differently: Take 40 mg by mouth daily.  02/21/19  Yes Gottschalk, Ashly M, DO  PARoxetine (PAXIL) 20 MG tablet TAKE (1) TABLET DAILY IN THE MORNING. Patient taking differently: Take 20 mg by mouth daily.  05/19/19  Yes Gottschalk, Ashly M, DO  rosuvastatin (CRESTOR) 40 MG tablet TAKE (1/2) TABLET DAILY. 10/06/19  Yes Ronnie Doss M, DO  amiodarone (PACERONE) 100 MG tablet Take 1 tablet (100 mg total) by mouth daily. 09/30/19   Croitoru, Mihai, MD  carboxymethylcellulose (REFRESH PLUS) 0.5 % SOLN Place 1 drop into both eyes 3 (three) times daily as needed (dry/irritated eyes.).    [provider]  diphenoxylate-atropine (LOMOTIL)  2.5-0.025 MG tablet TAKE 1 TABLET EVERY 8 HOURS AS NEEDED FOR DIARRHEA Patient taking differently: Take 1 tablet by mouth daily.  03/25/19   [name], DO  Multiple Vitamins-Minerals (CENTRUM SILVER ULTRA WOMENS PO) Take 1 tablet by mouth daily.    [provider]  torsemide (DEMADEX) 20 MG tablet Take 1 tablet (20 mg total) by mouth daily as needed (edema). 04/01/19   [name], DO  Vitamin D, Ergocalciferol, (DRISDOL) 1.25 MG (50000 UNIT) CAPS capsule TAKE 1 CAPSULE ONCE EVERY 7 DAYS Patient not taking: Reported on 11/15/2019 07/30/19   [name], DO     ALLERGIES:  Allergies  Allergen Reactions  . Sotalol Other (See Comments)    Torsades   . Bactrim [Sulfamethoxazole-Trimethoprim]     Itchy rash  . Ciprofloxacin Nausea Only  . Actos [Pioglitazone] Swelling  . Adhesive [Tape] Other (See Comments)    Causes sores  . Contrast Media [Iodinated Diagnostic Agents] Other (See Comments)    Headache (no action/pre-med required)  . Latex Rash  . Lipitor [Atorvastatin] Other (See Comments)    myalgia     SOCIAL HISTORY:  Social History   Socioeconomic History  . Marital status: Widowed    Spouse name: Not on file  . Number of children: 4  . Years of education: 8  . Highest education level: 8th grade  Occupational History  . Occupation: Retired  Tobacco Use  . Smoking status: Never Smoker  . Smokeless tobacco: Never Used  Vaping Use  . Vaping Use: Never used  Substance and Sexual Activity  . Alcohol use: No  . Drug use: No  . Sexual activity: Never    Birth control/protection: Post-menopausal  Other Topics Concern  . Not on file  Social History Narrative  . Not on file   Social Determinants of Health   Financial Resource Strain: Low Risk   . Difficulty of Paying Living Expenses: Not hard at all  Food Insecurity: No Food Insecurity  . Worried About Charity fundraiser in the Last Year: Never true  . Ran Out of Food in the Last Year:  Never true  Transportation Needs: No Transportation Needs  . Lack of Transportation (Medical): No  . Lack of Transportation (Non-Medical): No  Physical Activity: Inactive  . Days of Exercise per Week: 0 days  . Minutes of Exercise per Session: 0 min  Stress: No Stress Concern Present  . Feeling of Stress : Only a little  Social Connections: Moderately Integrated  . Frequency of Communication with Friends and Family: More than three times a week  . Frequency of Social Gatherings with Friends and Family: Once a week  . Attends Religious Services: More than 4 times per year  . Active Member of Clubs or Organizations: Yes  . Attends Archivist Meetings: More than 4 times per year  . Marital Status: Widowed  Intimate Partner Violence: Not At Risk  . Fear of Current or Ex-Partner: No  . Emotionally Abused: No  . Physically Abused: No  . Sexually Abused: No     FAMILY HISTORY:  Family History  Problem Relation Age of Onset  . Stroke Mother   . Other Mother        brain tumor  . Other Father        MI  . Heart attack Father   . Stroke Sister   . Macular degeneration Sister   . Diabetes Daughter   . Cancer Daughter        ovarian  . Colon polyps Daughter   . Atrial fibrillation Sister   . Hyperlipidemia Sister   . Osteoporosis Sister   . Stroke Sister   . Uterine cancer Sister   . Heart attack Brother   . Heart disease Brother   . Asthma Brother   . Breast cancer Neg Hx       REVIEW OF SYSTEMS:  Review of Systems  All other systems reviewed and are negative.   VITAL SIGNS:  Temp:  [97.5 F (36.4 C)-98.2 F (36.8 C)] 98.2 F (36.8 C) (10/30 0920) Pulse Rate:  [61-77] 61 (10/30 0920) Resp:  [13-22] 18 (10/30 0920) BP: (151-193)/(61-88) 151/61 (10/30 0920) SpO2:  [90 %-100 %] 98 % (10/30 0920) Weight:  [63.1 kg-63.2 kg] 63.2 kg (10/30 0920)     Height: 5\' 3"  (160 cm) Weight: 63.2 kg BMI (Calculated): 24.69   INTAKE/OUTPUT:  10/29 0701 - 10/30 0700 In:  -  Out: 2000 [Urine:2000]  PHYSICAL EXAM:  Physical Exam Blood pressure (!) 151/61, pulse 61, temperature 98.2 F (36.8 C), resp. rate 18, height 5\' 3"  (1.6 m), weight 63.2 kg, SpO2 98 %. Last Weight  Most recent update: 11/15/2019  9:29 AM   Weight  63.2 kg (139 lb 5.3 oz)            CONSTITUTIONAL: Well developed, frail elderly female resting supine in bed, appropriately responsive and aware without distress.   EYES: Sclera non-icteric.   EARS, NOSE, MOUTH AND THROAT:  The oropharynx is clear. Oral mucosa is pink and moist.    Hearing is intact to voice.  NECK: Trachea is midline, and there is no jugular venous distension.  LYMPH NODES:  No cervical masses noted. RESPIRATORY:  Lungs are clear, and breath sounds are equal bilaterally. Normal respiratory effort without pathologic use of accessory muscles. CARDIOVASCULAR: Heart is regular in rate and rhythm. GI: The abdomen is soft, nontender, and nondistended. There were no palpable masses. I did not appreciate hepatosplenomegaly. There were normal bowel sounds.  A very mild subjective tenderness is illicited in the LLQ with the sensation of urinary urgency on palpation.  No peritoneal signs, no guarding, no rebound.  MUSCULOSKELETAL:  No gross deformity.    SKIN: Skin turgor is normal. NEUROLOGIC:  No focal defects noted.  PSYCH:  Alert and oriented to person, place and time. Affect is appropriate for  situation.  Data Reviewed I have personally reviewed what is currently available of the patient's imaging, recent labs and medical records.    Labs:  CBC Latest Ref Rng & Units 11/15/2019 11/14/2019 08/15/2019  WBC 4.0 - 10.5 K/uL 6.2 5.5 6.2  Hemoglobin 12.0 - 15.0 g/dL 12.4 12.8 11.6  Hematocrit 36 - 46 % 38.2 39.7 36.1  Platelets 150 - 400 K/uL 242 249 254   CMP Latest Ref Rng & Units 11/15/2019 11/14/2019 07/26/2019  Glucose 70 - 99 mg/dL 115(H) 121(H) 122(H)  BUN 8 - 23 mg/dL 21 23 15   Creatinine 0.44 - 1.00 mg/dL 1.35(H)  1.55(H) 1.27(H)  Sodium 135 - 145 mmol/L 139 133(L) 140  Potassium 3.5 - 5.1 mmol/L 3.9 3.8 4.1  Chloride 98 - 111 mmol/L 98 97(L) 107  CO2 22 - 32 mmol/L 28 26 24   Calcium 8.9 - 10.3 mg/dL 10.2 9.4 8.7(L)  Total Protein 6.5 - 8.1 g/dL 7.3 7.1 5.5(L)  Total Bilirubin 0.3 - 1.2 mg/dL 0.9 0.7 0.7  Alkaline Phos 38 - 126 U/L 67 72 60  AST 15 - 41 U/L 38 41 56(H)  ALT 0 - 44 U/L 31 33 27     Imaging studies:   Last 24 hrs: DG Chest Portable 1 View  Result Date: 11/14/2019 CLINICAL DATA:  Chest pain EXAM: PORTABLE CHEST 1 VIEW COMPARISON:  Radiograph 07/24/2019 FINDINGS: Cardiac pacer/defibrillator device is again seen with leads in stable position towards the cardiac apex with additional lead seen projecting over the left upper quadrant. Some chronically coarsened interstitial changes and hyperexpansion are similar to prior. No consolidation, features of edema, pneumothorax, or effusion. Stable cardiomediastinal contours with a calcified aorta. The osseous structures appear diffusely demineralized which may limit detection of small or nondisplaced fractures. No acute osseous or soft tissue abnormality. IMPRESSION: 1. No acute cardiopulmonary abnormality. 2. Stable chronically coarsened interstitial changes and hyperexpansion. Electronically Signed   By: [name] M.D.   On: 11/14/2019 22:33   CT Renal Stone Study  Result Date: 11/15/2019 CLINICAL DATA:  Lower abdominal pain with dizziness and hematuria. EXAM: CT ABDOMEN AND PELVIS WITHOUT CONTRAST TECHNIQUE: Multidetector CT imaging of the abdomen and pelvis was performed following the standard protocol without IV contrast. COMPARISON:  July 25, 2019 FINDINGS: Lower chest: No acute abnormality. Hepatobiliary: No focal liver abnormality is seen. No gallstones, gallbladder wall thickening, or biliary dilatation. Pancreas: Unremarkable. No pancreatic ductal dilatation or surrounding inflammatory changes. Spleen: Normal in size without focal  abnormality. Adrenals/Urinary Tract: Adrenal glands are unremarkable. The left kidney is asymmetrically small when compared to a normal size right kidney. A 1.4 cm x 0.8 cm area of low attenuation is seen within the anterior aspect of the lower pole of the right kidney. Numerous a subcentimeter non-obstructing renal stones of various sizes are again seen throughout both kidneys very mild bilateral hydronephrosis and hydroureter are seen without evidence of obstructing renal calculi. Bladder is unremarkable. Stomach/Bowel: There is a small hiatal hernia. Very mild appendiceal wall thickening is noted. No significant periappendiceal inflammatory fat stranding is seen. No evidence of bowel wall thickening, distention, or inflammatory changes. Vascular/Lymphatic: There is moderate to marked severity calcification and tortuosity of the abdominal aorta and bilateral common iliac arteries, without evidence of aneurysmal dilatation. No enlarged abdominal or pelvic lymph nodes. Reproductive: Uterus and bilateral adnexa are unremarkable. Other: No abdominal wall hernia or abnormality. No abdominopelvic ascites. Musculoskeletal: There is moderate to marked severity levoscoliosis of the lumbar spine with marked severity degenerative  changes seen throughout the lumbar spine. IMPRESSION: 1. Mild appendiceal wall thickening, without periappendiceal inflammatory fat stranding. Early appendicitis cannot completely be excluded. 2. Numerous bilateral subcentimeter non-obstructing renal stones. 3. Small hiatal hernia. 4. Moderate to marked severity levoscoliosis of the lumbar spine with marked severity degenerative changes throughout the lumbar spine. 5. Aortic atherosclerosis. Aortic Atherosclerosis (ICD10-I70.0). Electronically Signed   By: Virgina Norfolk M.D.   On: 11/15/2019 01:50     Assessment/Plan:  86 y.o. female with admission for above, complicated by pertinent comorbidities including.  Patient Active Problem List    Diagnosis Date Noted  . CAD (coronary artery disease)   . Hypothyroidism   . Hyponatremia   . Volume depletion   . TSH elevation 08/01/2019  . Hospital discharge follow-up 08/01/2019  . Exudative age-related macular degeneration of right eye with active choroidal neovascularization ([place]) 04/30/2019  . Retinal hemorrhage of right eye 04/30/2019  . Advanced nonexudative age-related macular degeneration of left eye with subfoveal involvement 04/30/2019  . Exudative age-related macular degeneration of left eye with inactive choroidal neovascularization ([place]) 04/30/2019  . Advanced nonexudative age-related macular degeneration of right eye without subfoveal involvement 04/30/2019  . Posterior vitreous detachment of both eyes 04/30/2019  . Secondary hypercoagulable state ([place]) 03/11/2019  . Persistent atrial fibrillation ([place])   . Proctitis 01/09/2019  . Atrial fibrillation, chronic ([place]) 01/09/2019  . Hyperlipidemia 01/09/2019  . Nausea and vomiting 01/09/2019  . Hyperglycemia 01/09/2019  . COVID-19 virus infection 12/28/2018  . Chronic diarrhea 09/18/2018  . Abdominal pain 09/18/2018  . Osteoarthritis of right knee 03/29/2018  . GERD (gastroesophageal reflux disease)   . Diarrhea of presumed infectious origin 02/06/2018  . Essential hypertension 12/21/2017  . Degenerative scoliosis 02/19/2017  . Burst fracture of lumbar vertebra ([place]) 02/19/2017  . History of drug-induced prolonged QT interval with torsade de pointes 11/01/2016  . Iron deficiency anemia 10/30/2016  . Long term current use of anticoagulant 02/17/2016  . Near syncope 05/22/2015  . Diabetes mellitus type 2, diet-controlled ([place]) 09/14/2014  . ICD (implantable cardioverter-defibrillator) battery depletion 01/20/2014  . Abnormal nuclear stress test, 12/30/13 12/31/2013  . Coronary artery disease involving native coronary artery of native heart without angina pectoris 12/31/2013  . Paroxysmal atrial fibrillation, chads2  Vasc2 score of 5, on eliquis 10/16/2013  . Catecholaminergic polymorphic ventricular tachycardia ([place]) 10/16/2013  . Ureteral stone with hydronephrosis 05/20/2013  . Metabolic syndrome 02/40/9735  . Orthostatic hypotension 07/27/2011  . Chest pain, r/o cardiac, negative MI 07/25/2011  . Weakness 07/25/2011    Class: Acute  . Chronic sinus bradycardia 07/25/2011  . ICD (implantable cardioverter-defibrillator) in place 07/25/2011  . CKD (chronic kidney disease) stage 3, GFR 30-59 ml/min (HCC) 07/25/2011    Class: Chronic  . History of DVT in the past, on eliquis now 07/25/2011    Class: History of  . Nephrolithiasis, just saw Dr [name] 07/25/2011    Class: History of  . DYSPHAGIA 03/24/2010  . Chronic ischemic heart disease 12/03/2007  . Irritable bowel syndrome with diarrhea 12/03/2007  . EPIGASTRIC PAIN 12/03/2007  . Hyperlipidemia associated with type 2 diabetes mellitus ([place]) 12/02/2007    Class: History of  . EXTERNAL HEMORRHOIDS 12/02/2007    Class: History of  . ESOPHAGITIS 12/02/2007  . GASTRITIS 12/02/2007  . DIVERTICULOSIS, COLON 12/02/2007  . ARTHRITIS 12/02/2007    Class: Chronic    - No clinical evidence of any finding c/w early appendicitis.  I have personally reviewed the CT images.   - We discussed  a bowel regimen to prevent swings of constipation and diarrhea.     - DVT prophylaxis  - Will sign off, feel free to reconsult should other issues arise.   All of the above findings and recommendations were discussed with the patient and  family(if present), and all of patient's and present family's questions were answered to their expressed satisfaction.  Thank you for the opportunity to participate in this patient's care.   -- Ronny Bacon, M.D., FACS 11/15/2019, 9:33 AM  [phone]

## 2019-11-15 NOTE — ED Provider Notes (Signed)
[name] left at change of shift to get the results of her CT of the abdomen/pelvis. Patient presented with chest and abdominal pain. Patient tells me her abdominal pain started maybe 2 weeks ago and got worse over the past week. She also complains of a lot of burning with defecation.  Patient noted to be wearing a nasal cannula and her pulse ox was down in the low 80s when I first entered the room. However when she rolled over onto her back her pulse ox did improve to about 90% with her nasal cannula oxygen.  On exam patient is very tender in her right lower quadrant but also other areas of her abdomen but she seems to react most in the right lower quadrant.  Patient CT has questionable for early appendicitis, her history does not quite fit with that. No white count or nausea or vomiting. Also of concern is she was hypoxic when she first arrived in the ED with her pulse ox only being 90% on room air. Her chest x-ray does not show any acute changes. D-dimer was added to her blood work. Her delta troponin was negative as was her BNP. If her D-dimer is high she may need to have a nuclear medicine perfusion scan, she has a underlying renal insufficiency which is slightly worse tonight.  3:11 AM patient discussed with Dr. Olevia Bowens, hospitalist. He is going to admit and consult surgery in the morning.  Results for orders placed or performed during the hospital encounter of 11/14/19  Comprehensive metabolic panel  Result Value Ref Range   Sodium 133 (L) 135 - 145 mmol/L   Potassium 3.8 3.5 - 5.1 mmol/L   Chloride 97 (L) 98 - 111 mmol/L   CO2 26 22 - 32 mmol/L   Glucose, Bld 121 (H) 70 - 99 mg/dL   BUN 23 8 - 23 mg/dL   Creatinine, Ser 1.55 (H) 0.44 - 1.00 mg/dL   Calcium 9.4 8.9 - 10.3 mg/dL   Total Protein 7.1 6.5 - 8.1 g/dL   Albumin 3.9 3.5 - 5.0 g/dL   AST 41 15 - 41 U/L   ALT 33 0 - 44 U/L   Alkaline Phosphatase 72 38 - 126 U/L   Total Bilirubin 0.7 0.3 - 1.2 mg/dL   GFR, Estimated 32 (L)  >60 mL/min   Anion gap 10 5 - 15  Ethanol  Result Value Ref Range   Alcohol, Ethyl (B) <10 <10 mg/dL  Magnesium  Result Value Ref Range   Magnesium 2.2 1.7 - 2.4 mg/dL  Brain natriuretic peptide (order if patient c/o SOB ONLY)  Result Value Ref Range   B Natriuretic Peptide 109.0 (H) 0.0 - 100.0 pg/mL  CBC with Differential/Platelet  Result Value Ref Range   WBC 5.5 4.0 - 10.5 K/uL   RBC 4.10 3.87 - 5.11 MIL/uL   Hemoglobin 12.8 12.0 - 15.0 g/dL   HCT 39.7 36 - 46 %   MCV 96.8 80.0 - 100.0 fL   MCH 31.2 26.0 - 34.0 pg   MCHC 32.2 30.0 - 36.0 g/dL   RDW 13.5 11.5 - 15.5 %   Platelets 249 150 - 400 K/uL   nRBC 0.0 0.0 - 0.2 %   Neutrophils Relative % 63 %   Neutro Abs 3.4 1.7 - 7.7 K/uL   Lymphocytes Relative 20 %   Lymphs Abs 1.1 0.7 - 4.0 K/uL   Monocytes Relative 10 %   Monocytes Absolute 0.6 0.1 - 1.0 K/uL   Eosinophils Relative  6 %   Eosinophils Absolute 0.3 0.0 - 0.5 K/uL   Basophils Relative 1 %   Basophils Absolute 0.1 0.0 - 0.1 K/uL   Immature Granulocytes 0 %   Abs Immature Granulocytes 0.01 0.00 - 0.07 K/uL  Protime-INR  Result Value Ref Range   Prothrombin Time 15.3 (H) 11.4 - 15.2 seconds   INR 1.3 (H) 0.8 - 1.2  Urinalysis, Routine w reflex microscopic Urine, Clean Catch  Result Value Ref Range   Color, Urine STRAW (A) YELLOW   APPearance CLEAR CLEAR   Specific Gravity, Urine 1.003 (L) 1.005 - 1.030   pH 8.0 5.0 - 8.0   Glucose, UA NEGATIVE NEGATIVE mg/dL   Hgb urine dipstick MODERATE (A) NEGATIVE   Bilirubin Urine NEGATIVE NEGATIVE   Ketones, ur NEGATIVE NEGATIVE mg/dL   Protein, ur NEGATIVE NEGATIVE mg/dL   Nitrite NEGATIVE NEGATIVE   Leukocytes,Ua NEGATIVE NEGATIVE   RBC / HPF 0-5 0 - 5 RBC/hpf   WBC, UA 0-5 0 - 5 WBC/hpf   Bacteria, UA NONE SEEN NONE SEEN   Squamous Epithelial / LPF 0-5 0 - 5  CBG monitoring, ED  Result Value Ref Range   Glucose-Capillary 101 (H) 70 - 99 mg/dL   Comment 1 Notify RN    Comment 2 Document in Chart   Troponin  I (High Sensitivity)  Result Value Ref Range   Troponin I (High Sensitivity) 10 <18 ng/L  Troponin I (High Sensitivity)  Result Value Ref Range   Troponin I (High Sensitivity) 14 <18 ng/L    Laboratory interpretation all normal except stable renal insufficiency, delta troponin is negative   DG Chest Portable 1 View  Result Date: 11/14/2019 CLINICAL DATA:  Chest pain EXAM: PORTABLE CHEST 1 VIEW COMPARISON:  Radiograph 07/24/2019 FINDINGS: Cardiac pacer/defibrillator device is again seen with leads in stable position towards the cardiac apex with additional lead seen projecting over the left upper quadrant. Some chronically coarsened interstitial changes and hyperexpansion are similar to prior. No consolidation, features of edema, pneumothorax, or effusion. Stable cardiomediastinal contours with a calcified aorta. The osseous structures appear diffusely demineralized which may limit detection of small or nondisplaced fractures. No acute osseous or soft tissue abnormality. IMPRESSION: 1. No acute cardiopulmonary abnormality. 2. Stable chronically coarsened interstitial changes and hyperexpansion. Electronically Signed   By: [name] M.D.   On: 11/14/2019 22:33   CT Renal Stone Study  Result Date: 11/15/2019 CLINICAL DATA:  Lower abdominal pain with dizziness and hematuria. EXAM: CT ABDOMEN AND PELVIS WITHOUT CONTRAST TECHNIQUE: Multidetector CT imaging of the abdomen and pelvis was performed following the standard protocol without IV contrast. COMPARISON:  July 25, 2019 FINDINGS: Lower chest: No acute abnormality. Hepatobiliary: No focal liver abnormality is seen. No gallstones, gallbladder wall thickening, or biliary dilatation. Pancreas: Unremarkable. No pancreatic ductal dilatation or surrounding inflammatory changes. Spleen: Normal in size without focal abnormality. Adrenals/Urinary Tract: Adrenal glands are unremarkable. The left kidney is asymmetrically small when compared to a normal size  right kidney. A 1.4 cm x 0.8 cm area of low attenuation is seen within the anterior aspect of the lower pole of the right kidney. Numerous a subcentimeter non-obstructing renal stones of various sizes are again seen throughout both kidneys very mild bilateral hydronephrosis and hydroureter are seen without evidence of obstructing renal calculi. Bladder is unremarkable. Stomach/Bowel: There is a small hiatal hernia. Very mild appendiceal wall thickening is noted. No significant periappendiceal inflammatory fat stranding is seen. No evidence of bowel wall thickening, distention,  or inflammatory changes. Vascular/Lymphatic: There is moderate to marked severity calcification and tortuosity of the abdominal aorta and bilateral common iliac arteries, without evidence of aneurysmal dilatation. No enlarged abdominal or pelvic lymph nodes. Reproductive: Uterus and bilateral adnexa are unremarkable. Other: No abdominal wall hernia or abnormality. No abdominopelvic ascites. Musculoskeletal: There is moderate to marked severity levoscoliosis of the lumbar spine with marked severity degenerative changes seen throughout the lumbar spine. IMPRESSION: 1. Mild appendiceal wall thickening, without periappendiceal inflammatory fat stranding. Early appendicitis cannot completely be excluded. 2. Numerous bilateral subcentimeter non-obstructing renal stones. 3. Small hiatal hernia. 4. Moderate to marked severity levoscoliosis of the lumbar spine with marked severity degenerative changes throughout the lumbar spine. 5. Aortic atherosclerosis. Aortic Atherosclerosis (ICD10-I70.0). Electronically Signed   By: Virgina Norfolk M.D.   On: 11/15/2019 01:50   CUP PACEART REMOTE DEVICE CHECK  Result Date: 10/23/2019 Scheduled remote reviewed. Normal device function.  Next remote 91 days.  CRITICAL CARE Performed by: Nellie Chevalier L Jim Lundin Total critical care time: 31 minutes Critical care time was exclusive of separately billable procedures and  treating other patients. Critical care was necessary to treat or prevent imminent or life-threatening deterioration. Critical care was time spent personally by me on the following activities: development of treatment plan with patient and/or surrogate as well as nursing, discussions with consultants, evaluation of patient's response to treatment, examination of patient, obtaining history from patient or surrogate, ordering and performing treatments and interventions, ordering and review of laboratory studies, ordering and review of radiographic studies, pulse oximetry and re-evaluation of patient's condition.   Diagnoses that have been ruled out:  None  Diagnoses that are still under consideration:  None  Final diagnoses:  Weakness  Generalized abdominal pain  Atypical chest pain  Hypoxia  Abnormal CT of the abdomen   Plan admission  Rolland Porter, MD, Barbette Or, MD 11/15/19 [phone]

## 2019-11-15 NOTE — Progress Notes (Signed)
Patient bladder scan reveals 543 cc urine retention. MD notified and received verbal order to In and Out Cath patient.   IN and out Cath performed with 800 cc clear, yellow urine returned.   Will reassess patient's attempt to void.

## 2019-11-15 NOTE — H&P (Signed)
History and Physical    DANILA EDDIE NWG:[id number] DOB: October 30, 1931 DOA: 11/14/2019  PCP: [name], DO   Patient coming from: Home.  I have personally briefly reviewed patient's old medical records in [place]  Chief Complaint: Weakness and dizziness.  HPI: [name] is a 86 y.o. female with medical history significant of atrial fibrillation, history of cardioversion, AICD placement, history of CAD, history of MI, chronic systolic heart failure with most recent EF measurement of 40 to 45%,  history of near syncopal episode, orthostatic hypotension episodes, hypertension, anxiety, depression, osteoarthritis, cataracts, chronic back pain, stage III chronic kidney disease, history of DVT, diet controlled type 2 diabetes, diverticulosis, history of esophageal stricture, history of esophagitis, GERD, hiatal hernia, history of rectal bleeding, hyperlipidemia, macular degeneration, nephrolithiasis who is brought to the emergency department via EMS due to weakness and dizziness for the past 2 weeks.  The patient thinks that she may have had some chest pain, but does not have any symptomatology anymore.  She denies pain radiation, diaphoresis, palpitations, orthopnea or pitting edema of the lower extremities.  The patient also complains that she has been having abdominal pain with decreased appetite, early satiety, bloating sensation and change in bowel habits for the past 2 weeks.  She denies nausea, vomiting, diarrhea, melena or hematochezia.  No dysuria, frequency or hematuria.  She denies trauma sore throat, rhinorrhea, productive cough, dyspnea, wheezing or hemoptysis.  ED Course: Initial vital signs were temperature 97.5 F, pulse 77, respirations 22, blood pressure 193/80 mmHg O2 sat 96% on room air.  Lab work: Her urinalysis showed moderate hemoglobinuria, but was otherwise unremarkable.  CBC was normal with white count of 5.5, Moding 12.8 g/dL platelets 249.  PT was  15.3 and INR 1.3.  Troponin was normal on 3 different measurements.  Alcohol level was less than 10 mg/dL.  Sodium was 133, potassium 3.8, chloride 97 and CO2 26 mmol/L.  Glucose 121, BUN 23 and creatinine 1.55 mg/dL.  Her LFTs are normal.  Imaging: One view chest radiograph did not show any acute cardiopulmonary pathology.  There were stable chronically coarsening interstitial changes and hyperexpansion.  CT renal study showed mild appendiceal wall thickening, with periappendiceal inflammatory fat stranding.  Early appendicitis cannot be excluded.  Patient has numerous bilateral subcentimeter nonobstructing renal uroliths.  There is small hiatal hernia.  Moderate to marked severity levoscoliosis of the lumbar spine with severe DDD.  Aortic atherosclerosis.  Review of Systems: As per HPI otherwise all other systems reviewed and are negative.  Past Medical History:  Diagnosis Date  . AICD (automatic cardioverter/defibrillator) present   . Allergy    SESONAL  . Anxiety   . ARTHRITIS   . Arthritis   . Atrial fibrillation ([place])   . CAD (coronary artery disease)    a. history of cardiac arrest 1993;  b. s/p LAD/LCX stenting in 2003;  c. 07/2011 Cath: LM nl, LAD patent stent, D1 80ost, LCX patent stent, RCA min irregs;  d. 04/2012 MV: EF 66%, no ishcemia;  e. 12/2013 Echo: Ef 55%, no rwma, Gr 1 DD, triv AI/MR, mildly dil LA;  f. 12/2013 Lexi MV: intermediate risk - apical ischemia and inf/infsept fixed defect, ? artifactual.  . CAD in native artery 12/31/2013   Previous stents to LAD and Ramus patent on cath today 12/31/13 also There is severe disease in the ostial first diagonal which is unchanged from most recent cardiac catheterization. The right coronary artery could not be engaged selectively  but nonselective angiography showed no significant disease in the proximal and midsegment.     . Cataract    DENIES  . Chronic back pain   . Chronic sinus bradycardia   . CKD (chronic kidney disease), stage  III ([place])   . Clotting disorder (HCC)    DVT  . COLITIS 12/02/2007   Qualifier: Diagnosis of  By: Nils Pyle CMA ([place]), [name]    . Diabetes mellitus without complication ([place])    DENIES  . Diverticulosis of colon (without mention of hemorrhage) 2007   Colonoscopy   . Esophageal stricture    a. 2012 s/p dil.  . Esophagitis, unspecified    a. 2012 EGD  . EXTERNAL HEMORRHOIDS   . GERD (gastroesophageal reflux disease)    omeprazole  . H/O hiatal hernia   . Hiatal hernia    a. 2012 EGD.  Marland Kitchen History of DVT in the past, not on Coumadin now    left  leg  . HYPERCHOLESTEROLEMIA   . Hypertension   . ICD (implantable cardiac defibrillator) in place    a. s/p initial ICD in 1993 in setting of cardiac arrest;  b. 01/2007 gen change: Guidant T135 Vitality DS VR single lead ICD.  . Macular degeneration    gets injecton in eye every 5 weeks- last injection - 05/03/2013   . Myocardial infarction ([place]) 1993  . Near syncope 05/22/2015  . Nephrolithiasis, just saw Dr [name]   . Orthostatic hypotension   . Peripheral vascular disease ([place])    ???  . RECTAL BLEEDING 12/03/2007   Qualifier: Diagnosis of  By: Sharlett Iles MD Byrd Hesselbach   . Unspecified gastritis and gastroduodenitis without mention of hemorrhage    a. 2003 EGD->not noted on 2012 EGD.  Marland Kitchen Unstable angina, neg MI, cath stable.maybe GI 12/30/2013    Past Surgical History:  Procedure Laterality Date  . BREAST BIOPSY Left 11/2013  . CARDIAC CATHETERIZATION    . CARDIAC CATHETERIZATION  01/01/15   patent stents -there is severe disease in ostial 1st diag but without change.   Marland Kitchen CARDIAC DEFIBRILLATOR PLACEMENT  02/05/2007   Guidant  . CARDIOVASCULAR STRESS TEST  12/30/2013   abnormal  . CARDIOVERSION N/A 02/13/2019   Procedure: CARDIOVERSION;  Surgeon: Sanda Klein, MD;  Location: [facility];  Service: Cardiovascular;  Laterality: N/A;  . CATARACT EXTRACTION Right    Dr. Katy Fitch  . COLONOSCOPY    . coronary stents     .  CYSTOSCOPY WITH URETEROSCOPY Right 05/20/2013   Procedure: CYSTOSCOPY, RIGHT URETEROSCOPY STONE EXTRACTION, Insertion of right DOUBLE J STENT ;  Surgeon: Irine Seal, MD;  Location: [facility];  Service: Urology;  Laterality: Right;  . CYSTOSCOPY WITH URETEROSCOPY AND STENT PLACEMENT N/A 06/03/2013   Procedure: SECOND LOOK CYSTOSCOPY WITH URETEROSCOPY  HOLMIUM LASER LITHO AND STONE EXTRACTION Sammie Bench ;  Surgeon: Malka So, MD;  Location: [facility];  Service: Urology;  Laterality: N/A;  . GIVENS CAPSULE STUDY N/A 10/30/2016   Procedure: GIVENS CAPSULE STUDY;  Surgeon: Irene Shipper, MD;  Location: [facility];  Service: Endoscopy;  Laterality: N/A;  NEEDS TO BE ADMITTED FOR OBSERVATION PT. HAS DEFIB  . HEMORROIDECTOMY  80's  . HOLMIUM LASER APPLICATION Right 06/21/4401   Procedure: HOLMIUM LASER APPLICATION;  Surgeon: Irine Seal, MD;  Location: [facility];  Service: Urology;  Laterality: Right;  . HYSTEROSCOPY WITH D & C  09/02/2010   Procedure: DILATATION AND CURETTAGE (D&C) /HYSTEROSCOPY;  Surgeon: Margarette Asal;  Location: [facility]  ORS;  Service: Gynecology;  Laterality: N/A;  Dilation and Curettage with Hysteroscopy and Polypectomy  . IMPLANTABLE CARDIOVERTER DEFIBRILLATOR (ICD) GENERATOR CHANGE N/A 02/10/2014   Procedure: ICD GENERATOR CHANGE;  Surgeon: Sanda Klein, MD;  Location: [facility] CATH LAB;  Service: Cardiovascular;  Laterality: N/A;  . LEFT HEART CATHETERIZATION WITH CORONARY ANGIOGRAM N/A 07/28/2011   Procedure: LEFT HEART CATHETERIZATION WITH CORONARY ANGIOGRAM;  Surgeon: Lorretta Harp, MD;  Location: [facility] CATH LAB;  Service: Cardiovascular;  Laterality: N/A;  . LEFT HEART CATHETERIZATION WITH CORONARY ANGIOGRAM N/A 12/31/2013   Procedure: LEFT HEART CATHETERIZATION WITH CORONARY ANGIOGRAM;  Surgeon: Wellington Hampshire, MD;  Location: [facility] CATH LAB;  Service: Cardiovascular;  Laterality: N/A;    Social History  reports that she has never smoked. She has never used smokeless tobacco. She reports that she  does not drink alcohol and does not use drugs.  Allergies  Allergen Reactions  . Sotalol Other (See Comments)    Torsades   . Bactrim [Sulfamethoxazole-Trimethoprim]     Itchy rash  . Ciprofloxacin Nausea Only  . Actos [Pioglitazone] Swelling  . Adhesive [Tape] Other (See Comments)    Causes sores  . Contrast Media [Iodinated Diagnostic Agents] Other (See Comments)    Headache (no action/pre-med required)  . Latex Rash  . Lipitor [Atorvastatin] Other (See Comments)    myalgia    Family History  Problem Relation Age of Onset  . Stroke Mother   . Other Mother        brain tumor  . Other Father        MI  . Heart attack Father   . Stroke Sister   . Macular degeneration Sister   . Diabetes Daughter   . Cancer Daughter        ovarian  . Colon polyps Daughter   . Atrial fibrillation Sister   . Hyperlipidemia Sister   . Osteoporosis Sister   . Stroke Sister   . Uterine cancer Sister   . Heart attack Brother   . Heart disease Brother   . Asthma Brother   . Breast cancer Neg Hx    Prior to Admission medications   Medication Sig Start Date End Date Taking? Authorizing Provider  amiodarone (PACERONE) 100 MG tablet Take 1 tablet (100 mg total) by mouth daily. 09/30/19   Croitoru, Mihai, MD  apixaban (ELIQUIS) 5 MG TABS tablet Take 1 tablet (5 mg total) by mouth 2 (two) times daily. 10/07/19   Croitoru, Mihai, MD  carboxymethylcellulose (REFRESH PLUS) 0.5 % SOLN Place 1 drop into both eyes 3 (three) times daily as needed (dry/irritated eyes.).    [provider]  diphenoxylate-atropine (LOMOTIL) 2.5-0.025 MG tablet TAKE 1 TABLET EVERY 8 HOURS AS NEEDED FOR DIARRHEA Patient taking differently: Take 1 tablet by mouth daily.  03/25/19   [name], DO  hydrALAZINE (APRESOLINE) 10 MG tablet TAKE 1 TABLET EVERY 8 HOURS 10/09/19   Croitoru, Mihai, MD  isosorbide mononitrate (IMDUR) 30 MG 24 hr tablet TAKE ONE HALF (1/2) TABLET DAILY 09/23/19   Croitoru, Dani Gobble, MD   levothyroxine (SYNTHROID) 75 MCG tablet Take 1 tablet (75 mcg total) by mouth daily before breakfast. 09/01/19   Ronnie Doss M, DO  loratadine (CLARITIN) 10 MG tablet Take 1 tablet (10 mg total) by mouth daily. (for itching) Patient taking differently: Take 10 mg by mouth as needed for itching.  01/20/19   [name], DO  meclizine (ANTIVERT) 25 MG tablet Take 25 mg by mouth 3 (three)  times daily as needed for dizziness or nausea.  07/22/19   [provider]  metoprolol tartrate (LOPRESSOR) 25 MG tablet TAKE  (1)  TABLET TWICE A DAY. Patient taking differently: As needed 09/23/19   Croitoru, Mihai, MD  Multiple Vitamins-Minerals (CENTRUM SILVER ULTRA WOMENS PO) Take 1 tablet by mouth daily.    [provider]  nitroGLYCERIN (NITROSTAT) 0.4 MG SL tablet Place 1 tablet (0.4 mg total) under the tongue every 5 (five) minutes as needed for chest pain. 12/20/18   Claretta Fraise, MD  pantoprazole (PROTONIX) 40 MG tablet Take 1 tablet (40 mg total) by mouth every evening. Patient taking differently: Take 40 mg by mouth daily.  02/21/19   Ronnie Doss M, DO  PARoxetine (PAXIL) 20 MG tablet TAKE (1) TABLET DAILY IN THE MORNING. Patient taking differently: Take 20 mg by mouth daily.  05/19/19   [name], DO  rosuvastatin (CRESTOR) 40 MG tablet TAKE (1/2) TABLET DAILY. 10/06/19   [name], DO  torsemide (DEMADEX) 20 MG tablet Take 1 tablet (20 mg total) by mouth daily as needed (edema). 04/01/19   [name], DO  Vitamin D, Ergocalciferol, (DRISDOL) 1.25 MG (50000 UNIT) CAPS capsule TAKE 1 CAPSULE ONCE EVERY 7 DAYS 07/30/19   [name], DO   Physical Exam: Vitals:   11/14/19 2155 11/15/19 0019 11/15/19 0030 11/15/19 0330  BP:  (!) 167/66 (!) 153/64 (!) 172/71  Pulse: 72 65 64 62  Resp: 17 15 14 17   Temp:      TempSrc:      SpO2: 100% 100% 100% 100%  Weight:      Height:       Constitutional: Frail, early female, looks acutely ill, but  nontoxic. Eyes: PERRL, lids and conjunctivae normal ENMT: Mucous membranes are mildly dry.  Posterior pharynx clear of any exudate or lesions. Neck: normal, supple, no masses, no thyromegaly Respiratory: clear to auscultation bilaterally, no wheezing, no crackles. Normal respiratory effort. No accessory muscle use.  Cardiovascular: Regular rate and rhythm, no murmurs / rubs / gallops. No extremity edema. 2+ pedal pulses. No carotid bruits.  Abdomen: Nondistended. Bowel sounds positive.  Diffuse abdominal tenderness with guarding, no masses palpated. No hepatosplenomegaly.  No erythema seen. Perianal area: No erythema seen. Musculoskeletal: no clubbing / cyanosis.  Good ROM, no contractures. Normal muscle tone.  Skin: no rashes, lesions, ulcers on limited dermatological examination. Neurologic: CN 2-12 grossly intact. Sensation intact, DTR normal. Strength 5/5 in all 4.  Psychiatric: Normal judgment and insight. Alert and oriented x 3. Normal mood.   Labs on Admission: I have personally reviewed following labs and imaging studies  CBC: Recent Labs  Lab 11/14/19 2156  WBC 5.5  NEUTROABS 3.4  HGB 12.8  HCT 39.7  MCV 96.8  PLT 903    Basic Metabolic Panel: Recent Labs  Lab 11/14/19 2156  NA 133*  K 3.8  CL 97*  CO2 26  GLUCOSE 121*  BUN 23  CREATININE 1.55*  CALCIUM 9.4  MG 2.2    GFR: Estimated Creatinine Clearance: 23.3 mL/min (A) (by C-G formula based on SCr of 1.55 mg/dL (H)).  Liver Function Tests: Recent Labs  Lab 11/14/19 2156  AST 41  ALT 33  ALKPHOS 72  BILITOT 0.7  PROT 7.1  ALBUMIN 3.9    Urine analysis:    Component Value Date/Time   COLORURINE STRAW (A) 11/14/2019 2145   APPEARANCEUR CLEAR 11/14/2019 2145   APPEARANCEUR Clear 08/15/2019 0856  LABSPEC 1.003 (L) 11/14/2019 2145   PHURINE 8.0 11/14/2019 2145   GLUCOSEU NEGATIVE 11/14/2019 2145   HGBUR MODERATE (A) 11/14/2019 2145   BILIRUBINUR NEGATIVE 11/14/2019 2145   BILIRUBINUR Negative  08/15/2019 0856   KETONESUR NEGATIVE 11/14/2019 2145   PROTEINUR NEGATIVE 11/14/2019 2145   UROBILINOGEN negative 03/15/2015 1352   UROBILINOGEN 0.2 07/25/2011 2213   NITRITE NEGATIVE 11/14/2019 2145   LEUKOCYTESUR NEGATIVE 11/14/2019 2145    Radiological Exams on Admission: DG Chest Portable 1 View  Result Date: 11/14/2019 CLINICAL DATA:  Chest pain EXAM: PORTABLE CHEST 1 VIEW COMPARISON:  Radiograph 07/24/2019 FINDINGS: Cardiac pacer/defibrillator device is again seen with leads in stable position towards the cardiac apex with additional lead seen projecting over the left upper quadrant. Some chronically coarsened interstitial changes and hyperexpansion are similar to prior. No consolidation, features of edema, pneumothorax, or effusion. Stable cardiomediastinal contours with a calcified aorta. The osseous structures appear diffusely demineralized which may limit detection of small or nondisplaced fractures. No acute osseous or soft tissue abnormality. IMPRESSION: 1. No acute cardiopulmonary abnormality. 2. Stable chronically coarsened interstitial changes and hyperexpansion. Electronically Signed   By: [name] M.D.   On: 11/14/2019 22:33   CT Renal Stone Study  Result Date: 11/15/2019 CLINICAL DATA:  Lower abdominal pain with dizziness and hematuria. EXAM: CT ABDOMEN AND PELVIS WITHOUT CONTRAST TECHNIQUE: Multidetector CT imaging of the abdomen and pelvis was performed following the standard protocol without IV contrast. COMPARISON:  July 25, 2019 FINDINGS: Lower chest: No acute abnormality. Hepatobiliary: No focal liver abnormality is seen. No gallstones, gallbladder wall thickening, or biliary dilatation. Pancreas: Unremarkable. No pancreatic ductal dilatation or surrounding inflammatory changes. Spleen: Normal in size without focal abnormality. Adrenals/Urinary Tract: Adrenal glands are unremarkable. The left kidney is asymmetrically small when compared to a normal size right kidney. A  1.4 cm x 0.8 cm area of low attenuation is seen within the anterior aspect of the lower pole of the right kidney. Numerous a subcentimeter non-obstructing renal stones of various sizes are again seen throughout both kidneys very mild bilateral hydronephrosis and hydroureter are seen without evidence of obstructing renal calculi. Bladder is unremarkable. Stomach/Bowel: There is a small hiatal hernia. Very mild appendiceal wall thickening is noted. No significant periappendiceal inflammatory fat stranding is seen. No evidence of bowel wall thickening, distention, or inflammatory changes. Vascular/Lymphatic: There is moderate to marked severity calcification and tortuosity of the abdominal aorta and bilateral common iliac arteries, without evidence of aneurysmal dilatation. No enlarged abdominal or pelvic lymph nodes. Reproductive: Uterus and bilateral adnexa are unremarkable. Other: No abdominal wall hernia or abnormality. No abdominopelvic ascites. Musculoskeletal: There is moderate to marked severity levoscoliosis of the lumbar spine with marked severity degenerative changes seen throughout the lumbar spine. IMPRESSION: 1. Mild appendiceal wall thickening, without periappendiceal inflammatory fat stranding. Early appendicitis cannot completely be excluded. 2. Numerous bilateral subcentimeter non-obstructing renal stones. 3. Small hiatal hernia. 4. Moderate to marked severity levoscoliosis of the lumbar spine with marked severity degenerative changes throughout the lumbar spine. 5. Aortic atherosclerosis. Aortic Atherosclerosis (ICD10-I70.0). Electronically Signed   By: Virgina Norfolk M.D.   On: 11/15/2019 01:50   01/21/2019 echocardiogram  IMPRESSIONS  1. Left ventricular ejection fraction, by visual estimation, is 40 to  45%. The left ventricle has normal function. There is no left ventricular  hypertrophy.  2. The left ventricle demonstrates global hypokinesis.  3. Global right ventricle has normal  systolic function.The right  ventricular size is normal. No increase in right  ventricular wall  thickness.  4. Left atrial size was mildly dilated.  5. Right atrial size was normal.  6. Mild mitral annular calcification.  7. The mitral valve is normal in structure. Mild mitral valve  regurgitation. No evidence of mitral stenosis.  8. The tricuspid valve is normal in structure.  9. The aortic valve is normal in structure. Aortic valve regurgitation is  mild. No evidence of aortic valve sclerosis or stenosis.  10. The pulmonic valve was normal in structure. Pulmonic valve  regurgitation is trivial.  11. There is mild dilatation of the ascending aorta measuring 43 mm.  12. Moderately elevated pulmonary artery systolic pressure.  13. The tricuspid regurgitant velocity is 2.90 m/s, and with an assumed  right atrial pressure of 8 mmHg, the estimated right ventricular systolic  pressure is moderately elevated at 41.6 mmHg.  14. A pacer wire is visualized.  15. The inferior vena cava is normal in size with greater than 50%  respiratory variability, suggesting right atrial pressure of 3 mmHg.   EKG: Independently reviewed.  Vent. rate 76 BPM PR interval * ms QRS duration 157 ms QT/QTc 420/473 ms P-R-T axes 38 -42 126 Ventricular paced rhythm.  Assessment/Plan Principal Problem:   Abdominal pain (Questionable appendicitis on CT) Observation/telemetry. Keep n.p.o. Continue gentle IV hydration. Analgesics as needed. Follow CBC and CMP. General surgery evaluation.  Active Problems:   Vertigo Has similar episodes in August/2020. CT head at that time was negative. Consider MRI imaging if symptoms persist.    Hyponatremia Decreased oral intake and volume depleted. Continue IV hydration.    Volume depletion Continue gentle IV fluids.    History of DVT  Continue Eliquis.    Paroxysmal atrial fibrillation Chads2 Vasc2 score of 5, on eliquis. Metoprolol IV while  NPO. Resume amiodarone once cleared by general surgery.    Diabetes mellitus type 2, diet-controlled ([place]) Currently NPO. CBG monitoring every 6 hours.    Essential hypertension Continue metoprolol 5 mg IVP q 8-hour. Monitor blood pressure and heart rate.    GERD (gastroesophageal reflux disease) On Protonix IV. Switch to oral form when possible.    Hyperlipidemia Continue Crestor once cleared for oral intake.    CAD (coronary artery disease) On apixaban, metoprolol and rosuvastatin.    Hypothyroidism Resume oral Synthroid once on oral intake.   DVT prophylaxis: On apixaban. Code Status:   Full code. Family Communication: Disposition Plan:   Patient is from:  Home.  Anticipated DC to:  Home.  Anticipated DC date:  11/16/2019.  Anticipated DC barriers: Clinical status and surgical consult. Consults called:  Routine surgical consult. Admission status:  Observation/telemetry.   Severity of Illness:  Reubin Milan MD [facility]  How to contact the [facility] Attending or Consulting provider [place] or covering provider during after hours [place], for this patient?   1. Check the care team in [facility] and look for a) attending/consulting [facility] provider listed and b) the [facility] team listed 2. Log into [email or web address] and use [place]'s universal password to access. If you do not have the password, please contact the hospital operator. 3. Locate the [facility] provider you are looking for under [facility] and page to a number that you can be directly reached. 4. If you still have difficulty reaching the provider, please page the [facility] (Director on Call) for the Hospitalists listed on amion for assistance.  11/15/2019, 4:03 AM   This document was prepared using Dragon voice recognition software and may contain  some unintended transcription errors.

## 2019-11-15 NOTE — ED Notes (Signed)
Bottom sheet changed, pure wick replaced with new one, brief changed, and pt repositioned.

## 2019-11-15 NOTE — Progress Notes (Signed)
Patient admitted to the hospital earlier this morning by Dr. Olevia Bowens   Patient seen and examined.  She continues to have episodes of dizziness, abdominal pain.  She is not had any nausea or vomiting.  Has not had any bowel movements thus far.  She reports that she has been more constipated lately.  On exam, lungs are clear bilaterally.  She does have tenderness in the left lower quadrant  Assessment/plan  Abdominal pain -CT abdomen indicated questionable appendicitis -Seen by general surgery this was felt to be unlikely -Clinically, patient does not have any right lower quadrant pain at this time.  Appears to be mild left lower quadrant -It is possible that her symptoms may be related to constipation -Started on MiraLAX and Linzess -She does not have any fever or leukocytosis  Vertigo -She had similar episodes in the past, but symptoms appear to be worse over the past few weeks -Check orthostatic vital signs -MRI imaging not an option due to defibrillator -Started on meclizine and as needed benzos  Hyponatremia -Suspect related to decreased p.o. intake -Continue hydration  Paroxysmal atrial fibrillation -Anticoagulated with Eliquis -Followed at the A. fib clinic -Started on amiodarone recently  Diabetes, diet controlled -Monitor blood sugars  Hypertension -Resume home doses of hydralazine -Blood pressure currently stable  Hypothyroidism. -Resume home dose of Synthroid  Coronary artery disease -No complaints of chest pain -Continue statin, Imdur  Advance care planning -Discussed CODE STATUS with patient -She agrees to DNR -Per [name] was present for the conversation  Time spent: 35 mins  [employer]

## 2019-11-15 NOTE — ED Notes (Signed)
Pt in CT at this time.

## 2019-11-16 DIAGNOSIS — Z7189 Other specified counseling: Secondary | ICD-10-CM

## 2019-11-16 DIAGNOSIS — E119 Type 2 diabetes mellitus without complications: Secondary | ICD-10-CM | POA: Diagnosis not present

## 2019-11-16 DIAGNOSIS — R1084 Generalized abdominal pain: Secondary | ICD-10-CM | POA: Diagnosis not present

## 2019-11-16 DIAGNOSIS — R109 Unspecified abdominal pain: Secondary | ICD-10-CM | POA: Diagnosis not present

## 2019-11-16 LAB — BASIC METABOLIC PANEL
Anion gap: 8 (ref 5–15)
BUN: 19 mg/dL (ref 8–23)
CO2: 25 mmol/L (ref 22–32)
Calcium: 8.6 mg/dL — ABNORMAL LOW (ref 8.9–10.3)
Chloride: 102 mmol/L (ref 98–111)
Creatinine, Ser: 1.47 mg/dL — ABNORMAL HIGH (ref 0.44–1.00)
GFR, Estimated: 35 mL/min — ABNORMAL LOW (ref >60)
Glucose, Bld: 107 mg/dL — ABNORMAL HIGH (ref 70–99)
Potassium: 4.3 mmol/L (ref 3.5–5.1)
Sodium: 135 mmol/L (ref 135–145)

## 2019-11-16 LAB — GLUCOSE, CAPILLARY
Glucose-Capillary: 96 mg/dL (ref 70–99)
Glucose-Capillary: 147 mg/dL — ABNORMAL HIGH (ref 70–99)
Glucose-Capillary: 110 mg/dL — ABNORMAL HIGH (ref 70–99)
Glucose-Capillary: 105 mg/dL — ABNORMAL HIGH (ref 70–99)

## 2019-11-16 MED ORDER — POLYETHYLENE GLYCOL 3350 17 G PO PACK
17.0000 g | PACK | Freq: Every day | ORAL | Status: DC | PRN
  Administered 2019-11-21: 17 g via ORAL
  Filled 2019-11-16: qty 1

## 2019-11-16 MED ORDER — ACETAMINOPHEN 325 MG PO TABS
650.0000 mg | ORAL_TABLET | Freq: Four times a day (QID) | ORAL | Status: DC | PRN
  Administered 2019-11-17 – 2019-11-21 (×4): 650 mg via ORAL
  Filled 2019-11-16 (×4): qty 2

## 2019-11-16 MED ORDER — HYDROCORTISONE ACETATE 25 MG RE SUPP
25.0000 mg | Freq: Two times a day (BID) | RECTAL | Status: DC
  Administered 2019-11-16 – 2019-11-22 (×11): 25 mg via RECTAL
  Filled 2019-11-16 (×12): qty 1

## 2019-11-16 NOTE — Plan of Care (Signed)

## 2019-11-16 NOTE — Plan of Care (Signed)
  Problem: Acute Rehab PT Goals(only PT should resolve) Goal: Pt Will Go Supine/Side To Sit Outcome: Progressing Flowsheets (Taken 11/16/2019 1358) Pt will go Supine/Side to Sit: with supervision Goal: Pt Will Go Sit To Supine/Side Outcome: Progressing Flowsheets (Taken 11/16/2019 1358) Pt will go Sit to Supine/Side: with supervision Goal: Patient Will Transfer Sit To/From Stand Outcome: Progressing Flowsheets (Taken 11/16/2019 1358) Patient will transfer sit to/from stand: with supervision Goal: Pt Will Transfer Bed To Chair/Chair To Bed Outcome: Progressing Flowsheets (Taken 11/16/2019 1358) Pt will Transfer Bed to Chair/Chair to Bed: with supervision Goal: Pt Will Ambulate Outcome: Progressing Flowsheets (Taken 11/16/2019 1358) Pt will Ambulate:  > 125 feet  with supervision  with least restrictive assistive device   Pamala Hurry D. Hartnett-Rands, MS, PT Per [place] [phone] 11/16/2019

## 2019-11-16 NOTE — Evaluation (Signed)
Physical Therapy Evaluation Patient Details Name: [name] MRN: [id number] DOB: 06-29-1931 Today's Date: 11/16/2019   History of Present Illness  HPI: [name] is a 86 y.o. female with medical history significant of atrial fibrillation, history of cardioversion, AICD placement, history of CAD, history of MI, chronic systolic heart failure with most recent EF measurement of 40 to 45%,  history of near syncopal episode, orthostatic hypotension episodes, hypertension, anxiety, depression, osteoarthritis, cataracts, chronic back pain, stage III chronic kidney disease, history of DVT, diet controlled type 2 diabetes, diverticulosis, history of esophageal stricture, history of esophagitis, GERD, hiatal hernia, history of rectal bleeding, hyperlipidemia, macular degeneration, nephrolithiasis who is brought to the emergency department via EMS due to weakness and dizziness for the past 2 weeks.  The patient thinks that she may have had some chest pain, but does not have any symptomatology anymore.  She denies pain radiation, diaphoresis, palpitations, orthopnea or pitting edema of the lower extremities.  The patient also complains that she has been having abdominal pain with decreased appetite, early satiety, bloating sensation and change in bowel habits for the past 2 weeks.  She denies nausea, vomiting, diarrhea, melena or hematochezia.  No dysuria, frequency or hematuria.  She denies trauma sore throat, rhinorrhea, productive cough, dyspnea, wheezing or hemoptysis.    Clinical Impression  Pt admitted with above diagnosis. Patient agreeable to participating in evaluation today. Son present throughout session. Patient requiring min assist for trunk with bed mobility with HOB elevated. Patient complaining of dizziness with transitional movements. Patient completed transfers and ambulation with RW with min guard assistance for safety. Patient reported she is nearly blind in left eye due to macular  degeneration.  Pt currently with functional limitations due to the deficits listed below (see PT Problem List). Pt will benefit from skilled PT to increase their independence and safety with mobility to allow discharge to the venue listed below.  Patient unsafe to discharge home alone due to requiring assistance for bed mobility at this time and will benefit from continued post-acute therapy. Currently recommending skilled nursing facility stay unless caregivers can provide 24 hour assistance.       Follow Up Recommendations Home health PT;Supervision/Assistance - 24 hour;SNF    Equipment Recommendations  Other (comment) (shower chair that spans from in tub to out of tub for safer transfers)    Recommendations for Other Services       Precautions / Restrictions Precautions Precautions: Fall Restrictions Weight Bearing Restrictions: No      Mobility  Bed Mobility Overal bed mobility: Needs Assistance Bed Mobility: Supine to Sit     Supine to sit: HOB elevated;Min assist     General bed mobility comments: min assist for trunk    Transfers Overall transfer level: Needs assistance Equipment used: Rolling walker (2 wheeled) Transfers: Sit to/from [facility] Sit to Stand: Min guard Stand pivot transfers: Min guard  General transfer comment: min guard for safety; extra attempts for power up from commode with use of grab bar and RW  Ambulation/Gait Ambulation/Gait assistance: Min guard Gait Distance (Feet): 200 Feet Assistive device: Rolling walker (2 wheeled) Gait Pattern/deviations: Step-through pattern;Decreased step length - right;Decreased step length - left;Decreased stride length;Trunk flexed;Wide base of support Gait velocity: decreased   General Gait Details: slow, labored gait w/ complaints for some dizziness and decreased vision w/ macular degeneration; cues for walking within base of support; limited by fatigue  Stairs     Wheelchair Mobility     Modified  Rankin (Stroke Patients Only)       Balance Overall balance assessment: Needs assistance Sitting-balance support: Feet supported;Bilateral upper extremity supported Sitting balance-Leahy Scale: Good     Standing balance support: Bilateral upper extremity supported;During functional activity Standing balance-Leahy Scale: Fair Standing balance comment: fair w/ RW; heavy use of UEs        Pertinent Vitals/Pain Pain Assessment: 0-10 Pain Score: 6  Pain Location: headache and some abdominal pain Pain Intervention(s): Limited activity within patient's tolerance;Monitored during session    [place] expects to be discharged to:: Private residence Living Arrangements: Alone Available Help at Discharge: Family;Available PRN/intermittently (2 sons and daughter all live near her) Type of Home: House Home Access: Stairs to enter Entrance Stairs-Rails: None Entrance Stairs-Number of Steps: 1 Home Layout: Two level;Able to live on main level with bedroom/bathroom Home Equipment: Bedside commode;Wheelchair - Visual merchandiser - 2 wheels;Walker - 4 wheels;Grab bars - tub/shower;Hand held shower head;Cane - quad;Cane - single point      Prior Function Level of Independence: Needs assistance   Gait / Transfers Assistance Needed: family cautions her to not go downstairs to basement for laundry; walks outside with [facility] to do gardening  ADL's / Homemaking Assistance Needed: independent with BADLs; assistance for cleaning and laundry  Comments: weeds her garden     Hand Dominance   Dominant Hand: Right    Extremity/Trunk Assessment   Upper Extremity Assessment Upper Extremity Assessment: Overall WFL for tasks assessed    Lower Extremity Assessment Lower Extremity Assessment: Overall WFL for tasks assessed    Cervical / Trunk Assessment Cervical / Trunk Assessment: Kyphotic  Communication   Communication: No difficulties  Cognition  Arousal/Alertness: Awake/alert Behavior During Therapy: WFL for tasks assessed/performed Overall Cognitive Status: Within Functional Limits for tasks assessed       General Comments      Exercises     Assessment/Plan    PT Assessment Patient needs continued PT services  PT Problem List Decreased mobility;Decreased activity tolerance;Decreased balance;Decreased knowledge of use of DME       PT Treatment Interventions DME instruction;Therapeutic exercise;Gait training;Functional mobility training;Therapeutic activities;Patient/family education;Cognitive remediation    PT Goals (Current goals can be found in the Care Plan section)  Acute Rehab PT Goals Patient Stated Goal: Go home to live; considering living with family members temporarily instead of going to rehab. PT Goal Formulation: With patient/family Time For Goal Achievement: 11/30/19 Potential to Achieve Goals: Fair    Frequency Min 3X/week   Barriers to discharge           AM-PAC PT "6 Clicks" Mobility  Outcome Measure Help needed turning from your back to your side while in a flat bed without using bedrails?: A Little Help needed moving from lying on your back to sitting on the side of a flat bed without using bedrails?: A Lot Help needed moving to and from a bed to a chair (including a wheelchair)?: A Little Help needed standing up from a chair using your arms (e.g., wheelchair or bedside chair)?: A Little Help needed to walk in hospital room?: A Little Help needed climbing 3-5 steps with a railing? : A Lot 6 Click Score: 16    End of Session Equipment Utilized During Treatment: Gait belt Activity Tolerance: Patient tolerated treatment well;Patient limited by fatigue Patient left: in chair;with family/visitor present;with call bell/phone within reach Nurse Communication: Mobility status PT Visit Diagnosis: Unsteadiness on feet (R26.81);Other abnormalities of gait and mobility (R26.89)    Time:  3241-9914 PT  Time Calculation (min) (ACUTE ONLY): 43 min   Charges:   PT Evaluation $PT Eval Moderate Complexity: 1 Mod PT Treatments $Therapeutic Activity: 8-22 mins $Self Care/Home Management: 8-22        Floria Raveling. Hartnett-Rands, MS, PT Per [place] [phone] 11/16/2019, 1:51 PM

## 2019-11-16 NOTE — Progress Notes (Signed)
PROGRESS NOTE    [name]  ULA:[id number] DOB: 1931/11/21 DOA: 11/14/2019 PCP: [name], DO    Brief Narrative:  [name] is a 86 y.o. female with medical history significant of atrial fibrillation, history of cardioversion, AICD placement, history of CAD, history of MI, chronic systolic heart failure with most recent EF measurement of 40 to 45%,  history of near syncopal episode, orthostatic hypotension episodes, hypertension, anxiety, depression, osteoarthritis, cataracts, chronic back pain, stage III chronic kidney disease, history of DVT, diet controlled type 2 diabetes, diverticulosis, history of esophageal stricture, history of esophagitis, GERD, hiatal hernia, history of rectal bleeding, hyperlipidemia, macular degeneration, nephrolithiasis who is brought to the emergency department via EMS due to weakness and dizziness for the past 2 weeks.  The patient thinks that she may have had some chest pain, but does not have any symptomatology anymore.  She denies pain radiation, diaphoresis, palpitations, orthopnea or pitting edema of the lower extremities.  The patient also complains that she has been having abdominal pain with decreased appetite, early satiety, bloating sensation and change in bowel habits for the past 2 weeks.  She denies nausea, vomiting, diarrhea, melena or hematochezia.  No dysuria, frequency or hematuria.  She denies trauma sore throat, rhinorrhea, productive cough, dyspnea, wheezing or hemoptysis.   Assessment & Plan:   Principal Problem:   Abdominal pain Active Problems:   History of DVT in the past, on eliquis now   Paroxysmal atrial fibrillation, chads2 Vasc2 score of 5, on eliquis   Diabetes mellitus type 2, diet-controlled (HCC)   Essential hypertension   GERD (gastroesophageal reflux disease)   Hyperlipidemia   CAD (coronary artery disease)   Hypothyroidism   Hyponatremia   Volume depletion   Vertigo   Advanced care  planning/counseling discussion   Abdominal pain -CT abdomen indicated questionable appendicitis -Seen by general surgery this was felt to be unlikely -Clinically, patient does not have any right lower quadrant pain at this time.  Appears to be mild left lower quadrant -It is possible that her symptoms may be related to constipation -Started on MiraLAX and Linzess and she is now having frequent bowel movements -She reports abdominal pain is better although not resolved as of yet. -Since she is having multiple bowel movements, will change MiraLAX as needed, continue on Linzess -She does not have any fever or leukocytosis  Vertigo -She had similar episodes in the past, but symptoms appear to be worse over the past few weeks -She does have declined in systolic blood pressure from 170s to 140s on standing -MRI imaging not an option due to defibrillator -Started on meclizine and as needed benzos -With continued issues of dizziness, headache and tremor, will consult neurology  Hyponatremia -Suspect related to decreased p.o. intake -Improved with hydration  Paroxysmal atrial fibrillation -Anticoagulated with Eliquis -Followed at the A. fib clinic -Started on amiodarone recently  Diabetes, diet controlled -Monitor blood sugars  Hypertension -Resume home doses of hydralazine -Blood pressure currently stable  Hypothyroidism. -Resume home dose of Synthroid  Coronary artery disease -No complaints of chest pain -Continue statin, Imdur  Advance care planning -Discussed CODE STATUS with patient -She agrees to DNR -[name] was present for the conversation  Generalized weakness -Seen by physical therapy with recommendations for possible skilled nursing facility -Patient/family are still deciding whether they will be able to manage at home versus go to skilled nursing facility -Family is trying to see if they can arrange 24-hour support at home  DVT  prophylaxis:   apixaban (ELIQUIS) tablet 5 mg  Code Status: DNR Family Communication: Discussed with son at the bedside Disposition Plan: Status is: Inpatient  Remains inpatient appropriate because:Unsafe d/c plan   Dispo: The patient is from: Home              Anticipated d/c is to: SNF              Anticipated d/c date is: 2 days              Patient currently is not medically stable to d/c.    Consultants:     Procedures:     Antimicrobials:       Subjective: Reports her abdominal pain and dizziness are still present, although mildly better than yesterday.  She does complain of headache.  Continues to have tremors mostly in her left arm  Objective: Vitals:   11/15/19 1417 11/15/19 2300 11/16/19 0500 11/16/19 1222  BP: (!) 167/62 138/82 (!) 125/56 133/73  Pulse: (!) 57 64  (!) 58  Resp: 18 18 17 20   Temp:  98.7 F (37.1 C) (!) 97.5 F (36.4 C) 98.2 F (36.8 C)  TempSrc:  Oral Oral Oral  SpO2: 97% 96% 96% 96%  Weight:      Height:        Intake/Output Summary (Last 24 hours) at 11/16/2019 1807 Last data filed at 11/16/2019 0900 Gross per 24 hour  Intake 480 ml  Output 701 ml  Net -221 ml   Filed Weights   11/14/19 2124 11/15/19 0920  Weight: 63.1 kg 63.2 kg    Examination:  General exam: Appears calm and comfortable  Respiratory system: Clear to auscultation. Respiratory effort normal. Cardiovascular system: S1 & S2 heard, RRR. No JVD, murmurs, rubs, gallops or clicks. No pedal edema. Gastrointestinal system: Abdomen is nondistended, soft and mild tenderness in the lower abdomen, mostly in left lower quadrant. No organomegaly or masses felt. Normal bowel sounds heard. Central nervous system: Alert and oriented. No focal neurological deficits. Extremities: Symmetric 5 x 5 power. Skin: No rashes, lesions or ulcers Psychiatry: Judgement and insight appear normal. Mood & affect appropriate.     Data Reviewed: I have personally reviewed following labs and  imaging studies  CBC: Recent Labs  Lab 11/14/19 2156 11/15/19 0422  WBC 5.5 6.2  NEUTROABS 3.4 3.1  HGB 12.8 12.4  HCT 39.7 38.2  MCV 96.8 96.2  PLT 249 350   Basic Metabolic Panel: Recent Labs  Lab 11/14/19 2156 11/15/19 0422 11/16/19 0605  NA 133* 139 135  K 3.8 3.9 4.3  CL 97* 98 102  CO2 26 28 25   GLUCOSE 121* 115* 107*  BUN 23 21 19   CREATININE 1.55* 1.35* 1.47*  CALCIUM 9.4 10.2 8.6*  MG 2.2  --   --    GFR: Estimated Creatinine Clearance: 24.6 mL/min (A) (by C-G formula based on SCr of 1.47 mg/dL (H)). Liver Function Tests: Recent Labs  Lab 11/14/19 2156 11/15/19 0422  AST 41 38  ALT 33 31  ALKPHOS 72 67  BILITOT 0.7 0.9  PROT 7.1 7.3  ALBUMIN 3.9 4.0   No results for input(s): LIPASE, AMYLASE in the last 168 hours. No results for input(s): AMMONIA in the last 168 hours. Coagulation Profile: Recent Labs  Lab 11/14/19 2156  INR 1.3*   Cardiac Enzymes: No results for input(s): CKTOTAL, CKMB, CKMBINDEX, TROPONINI in the last 168 hours. BNP (last 3 results) No results for input(s): PROBNP in the last  8760 hours. HbA1C: No results for input(s): HGBA1C in the last 72 hours. CBG: Recent Labs  Lab 11/15/19 1158 11/15/19 1644 11/16/19 0725 11/16/19 1104 11/16/19 1623  GLUCAP 84 125* 96 147* 110*   Lipid Profile: No results for input(s): CHOL, HDL, LDLCALC, TRIG, CHOLHDL, LDLDIRECT in the last 72 hours. Thyroid Function Tests: No results for input(s): TSH, T4TOTAL, FREET4, T3FREE, THYROIDAB in the last 72 hours. Anemia Panel: No results for input(s): VITAMINB12, FOLATE, FERRITIN, TIBC, IRON, RETICCTPCT in the last 72 hours. Sepsis Labs: No results for input(s): PROCALCITON, LATICACIDVEN in the last 168 hours.  Recent Results (from the past 240 hour(s))  Respiratory Panel by RT PCR (Flu A&B, Covid) - Nasopharyngeal Swab     Status: None   Collection Time: 11/15/19  3:13 AM   Specimen: Nasopharyngeal Swab  Result Value Ref Range Status    SARS Coronavirus 2 by RT PCR NEGATIVE NEGATIVE Final    Comment: (NOTE) SARS-CoV-2 target nucleic acids are NOT DETECTED.  The SARS-CoV-2 RNA is generally detectable in upper respiratoy specimens during the acute phase of infection. The lowest concentration of SARS-CoV-2 viral copies this assay can detect is 131 copies/mL. A negative result does not preclude SARS-Cov-2 infection and should not be used as the sole basis for treatment or other patient management decisions. A negative result may occur with  improper specimen collection/handling, submission of specimen other than nasopharyngeal swab, presence of viral mutation(s) within the areas targeted by this assay, and inadequate number of viral copies (<131 copies/mL). A negative result must be combined with clinical observations, patient history, and epidemiological information. The expected result is Negative.  Fact Sheet for Patients:  PinkCheek.be  Fact Sheet for Healthcare Providers:  GravelBags.it  This test is no t yet approved or cleared by the [place] FDA and  has been authorized for detection and/or diagnosis of SARS-CoV-2 by FDA under an Emergency Use Authorization (EUA). This EUA will remain  in effect (meaning this test can be used) for the duration of the COVID-19 declaration under Section 564(b)(1) of the Act, 21 U.S.C. section 360bbb-3(b)(1), unless the authorization is terminated or revoked sooner.     Influenza A by PCR NEGATIVE NEGATIVE Final   Influenza B by PCR NEGATIVE NEGATIVE Final    Comment: (NOTE) The Xpert Xpress SARS-CoV-2/FLU/RSV assay is intended as an aid in  the diagnosis of influenza from Nasopharyngeal swab specimens and  should not be used as a sole basis for treatment. Nasal washings and  aspirates are unacceptable for Xpert Xpress SARS-CoV-2/FLU/RSV  testing.  Fact Sheet for  Patients: PinkCheek.be  Fact Sheet for Healthcare Providers: GravelBags.it  This test is not yet approved or cleared by the [place] FDA and  has been authorized for detection and/or diagnosis of SARS-CoV-2 by  FDA under an Emergency Use Authorization (EUA). This EUA will remain  in effect (meaning this test can be used) for the duration of the  Covid-19 declaration under Section 564(b)(1) of the Act, 21  U.S.C. section 360bbb-3(b)(1), unless the authorization is  terminated or revoked. Performed at [facility], [address], [place], [place] [address]          Radiology Studies: DG Chest Portable 1 View  Result Date: 11/14/2019 CLINICAL DATA:  Chest pain EXAM: PORTABLE CHEST 1 VIEW COMPARISON:  Radiograph 07/24/2019 FINDINGS: Cardiac pacer/defibrillator device is again seen with leads in stable position towards the cardiac apex with additional lead seen projecting over the left upper quadrant. Some chronically coarsened interstitial  changes and hyperexpansion are similar to prior. No consolidation, features of edema, pneumothorax, or effusion. Stable cardiomediastinal contours with a calcified aorta. The osseous structures appear diffusely demineralized which may limit detection of small or nondisplaced fractures. No acute osseous or soft tissue abnormality. IMPRESSION: 1. No acute cardiopulmonary abnormality. 2. Stable chronically coarsened interstitial changes and hyperexpansion. Electronically Signed   By: [name] M.D.   On: 11/14/2019 22:33   CT Renal Stone Study  Result Date: 11/15/2019 CLINICAL DATA:  Lower abdominal pain with dizziness and hematuria. EXAM: CT ABDOMEN AND PELVIS WITHOUT CONTRAST TECHNIQUE: Multidetector CT imaging of the abdomen and pelvis was performed following the standard protocol without IV contrast. COMPARISON:  July 25, 2019 FINDINGS: Lower chest: No acute abnormality. Hepatobiliary: No  focal liver abnormality is seen. No gallstones, gallbladder wall thickening, or biliary dilatation. Pancreas: Unremarkable. No pancreatic ductal dilatation or surrounding inflammatory changes. Spleen: Normal in size without focal abnormality. Adrenals/Urinary Tract: Adrenal glands are unremarkable. The left kidney is asymmetrically small when compared to a normal size right kidney. A 1.4 cm x 0.8 cm area of low attenuation is seen within the anterior aspect of the lower pole of the right kidney. Numerous a subcentimeter non-obstructing renal stones of various sizes are again seen throughout both kidneys very mild bilateral hydronephrosis and hydroureter are seen without evidence of obstructing renal calculi. Bladder is unremarkable. Stomach/Bowel: There is a small hiatal hernia. Very mild appendiceal wall thickening is noted. No significant periappendiceal inflammatory fat stranding is seen. No evidence of bowel wall thickening, distention, or inflammatory changes. Vascular/Lymphatic: There is moderate to marked severity calcification and tortuosity of the abdominal aorta and bilateral common iliac arteries, without evidence of aneurysmal dilatation. No enlarged abdominal or pelvic lymph nodes. Reproductive: Uterus and bilateral adnexa are unremarkable. Other: No abdominal wall hernia or abnormality. No abdominopelvic ascites. Musculoskeletal: There is moderate to marked severity levoscoliosis of the lumbar spine with marked severity degenerative changes seen throughout the lumbar spine. IMPRESSION: 1. Mild appendiceal wall thickening, without periappendiceal inflammatory fat stranding. Early appendicitis cannot completely be excluded. 2. Numerous bilateral subcentimeter non-obstructing renal stones. 3. Small hiatal hernia. 4. Moderate to marked severity levoscoliosis of the lumbar spine with marked severity degenerative changes throughout the lumbar spine. 5. Aortic atherosclerosis. Aortic Atherosclerosis  (ICD10-I70.0). Electronically Signed   By: Virgina Norfolk M.D.   On: 11/15/2019 01:50        Scheduled Meds: . amiodarone  200 mg Oral Daily  . apixaban  5 mg Oral BID  . hydrALAZINE  10 mg Oral Q8H  . hydrocortisone  25 mg Rectal BID  . isosorbide mononitrate  15 mg Oral Daily  . levothyroxine  75 mcg Oral QAC breakfast  . linaclotide  145 mcg Oral QAC breakfast  . meclizine  25 mg Oral TID  . pantoprazole  40 mg Oral Daily  . PARoxetine  20 mg Oral Daily  . rosuvastatin  20 mg Oral Daily   Continuous Infusions: . sodium chloride 75 mL/hr at 11/16/19 0901     LOS: 1 day    Time spent: 61mins    Kathie Dike, MD [facility]   If 7PM-7AM, please contact night-coverage [email or web address]  11/16/2019, 6:07 PM

## 2019-11-16 NOTE — Progress Notes (Signed)
Orthostatic vitals completed and documented as follows:    11/16/19 0900  Orthostatic Lying   BP- Lying 178/84  Pulse- Lying 62  Orthostatic Sitting  BP- Sitting 172/89  Pulse- Sitting 63  Orthostatic Standing at 0 minutes  BP- Standing at 0 minutes 133/75  Pulse- Standing at 0 minutes 67  Orthostatic Standing at 3 minutes  BP- Standing at 3 minutes 147/76  Pulse- Standing at 3 minutes 72

## 2019-11-17 ENCOUNTER — Telehealth: Payer: Self-pay

## 2019-11-17 ENCOUNTER — Ambulatory Visit: Payer: Medicare Other | Admitting: Family Medicine

## 2019-11-17 DIAGNOSIS — I10 Essential (primary) hypertension: Secondary | ICD-10-CM | POA: Diagnosis not present

## 2019-11-17 DIAGNOSIS — R1010 Upper abdominal pain, unspecified: Secondary | ICD-10-CM | POA: Diagnosis not present

## 2019-11-17 DIAGNOSIS — E871 Hypo-osmolality and hyponatremia: Secondary | ICD-10-CM | POA: Diagnosis not present

## 2019-11-17 DIAGNOSIS — E119 Type 2 diabetes mellitus without complications: Secondary | ICD-10-CM | POA: Diagnosis not present

## 2019-11-17 LAB — GLUCOSE, CAPILLARY
Glucose-Capillary: 85 mg/dL (ref 70–99)
Glucose-Capillary: 171 mg/dL — ABNORMAL HIGH (ref 70–99)

## 2019-11-17 LAB — HEPATIC FUNCTION PANEL
ALT: 27 U/L (ref 0–44)
AST: 33 U/L (ref 15–41)
Albumin: 3.2 g/dL — ABNORMAL LOW (ref 3.5–5.0)
Alkaline Phosphatase: 56 U/L (ref 38–126)
Bilirubin, Direct: 0.1 mg/dL (ref 0.0–0.2)
Indirect Bilirubin: 0.8 mg/dL (ref 0.3–0.9)
Total Bilirubin: 0.9 mg/dL (ref 0.3–1.2)
Total Protein: 5.9 g/dL — ABNORMAL LOW (ref 6.5–8.1)

## 2019-11-17 LAB — CBC
HCT: 34.6 % — ABNORMAL LOW (ref 36.0–46.0)
Hemoglobin: 11.3 g/dL — ABNORMAL LOW (ref 12.0–15.0)
MCH: 32.1 pg (ref 26.0–34.0)
MCHC: 32.7 g/dL (ref 30.0–36.0)
MCV: 98.3 fL (ref 80.0–100.0)
Platelets: 203 10*3/uL (ref 150–400)
RBC: 3.52 MIL/uL — ABNORMAL LOW (ref 3.87–5.11)
RDW: 13.5 % (ref 11.5–15.5)
WBC: 4.8 10*3/uL (ref 4.0–10.5)
nRBC: 0 % (ref 0.0–0.2)

## 2019-11-17 LAB — BASIC METABOLIC PANEL
Anion gap: 8 (ref 5–15)
BUN: 13 mg/dL (ref 8–23)
CO2: 21 mmol/L — ABNORMAL LOW (ref 22–32)
Calcium: 8.6 mg/dL — ABNORMAL LOW (ref 8.9–10.3)
Chloride: 109 mmol/L (ref 98–111)
Creatinine, Ser: 1.2 mg/dL — ABNORMAL HIGH (ref 0.44–1.00)
GFR, Estimated: 44 mL/min — ABNORMAL LOW (ref >60)
Glucose, Bld: 90 mg/dL (ref 70–99)
Potassium: 3.7 mmol/L (ref 3.5–5.1)
Sodium: 138 mmol/L (ref 135–145)

## 2019-11-17 MED ORDER — DICYCLOMINE HCL 10 MG PO CAPS
10.0000 mg | ORAL_CAPSULE | Freq: Three times a day (TID) | ORAL | Status: DC
  Administered 2019-11-17 – 2019-11-22 (×13): 10 mg via ORAL
  Filled 2019-11-17 (×13): qty 1

## 2019-11-17 MED ORDER — HYDRALAZINE HCL 20 MG/ML IJ SOLN
10.0000 mg | INTRAMUSCULAR | Status: DC | PRN
  Administered 2019-11-20: 10 mg via INTRAVENOUS
  Filled 2019-11-17: qty 1

## 2019-11-17 NOTE — Telephone Encounter (Signed)
Appointment scheduled.

## 2019-11-17 NOTE — Progress Notes (Signed)
Physical Therapy Treatment Patient Details Name: [name] MRN: [id number] DOB: 1932-01-16 Today's Date: 11/17/2019    History of Present Illness HPI: [name] is a 86 y.o. female with medical history significant of atrial fibrillation, history of cardioversion, AICD placement, history of CAD, history of MI, chronic systolic heart failure with most recent EF measurement of 40 to 45%,  history of near syncopal episode, orthostatic hypotension episodes, hypertension, anxiety, depression, osteoarthritis, cataracts, chronic back pain, stage III chronic kidney disease, history of DVT, diet controlled type 2 diabetes, diverticulosis, history of esophageal stricture, history of esophagitis, GERD, hiatal hernia, history of rectal bleeding, hyperlipidemia, macular degeneration, nephrolithiasis who is brought to the emergency department via EMS due to weakness and dizziness for the past 2 weeks.  The patient thinks that she may have had some chest pain, but does not have any symptomatology anymore.  She denies pain radiation, diaphoresis, palpitations, orthopnea or pitting edema of the lower extremities.  The patient also complains that she has been having abdominal pain with decreased appetite, early satiety, bloating sensation and change in bowel habits for the past 2 weeks.  She denies nausea, vomiting, diarrhea, melena or hematochezia.  No dysuria, frequency or hematuria.  She denies trauma sore throat, rhinorrhea, productive cough, dyspnea, wheezing or hemoptysis.    PT Comments    Patient demonstrates good return for getting into/out of bed with less assistance, fair/good return for completing BLE ROM/strengthening exercises with verbal cueing and demonstration while seated at EOB and ambulated in room/hallway without loss of balance.  Patient tolerated staying up in chair after therapy.  Patient will benefit from continued physical therapy in hospital and recommended venue below to increase  strength, balance, endurance for safe ADLs and gait.    Follow Up Recommendations  Home health PT;Supervision for mobility/OOB;Supervision - Intermittent     Equipment Recommendations  Other (comment) (shower chair that spans from in tub to out of tub for safer transfers)    Recommendations for Other Services       Precautions / Restrictions Precautions Precautions: Fall Restrictions Weight Bearing Restrictions: No    Mobility  Bed Mobility Overal bed mobility: Needs Assistance Bed Mobility: Supine to Sit;Sit to Supine     Supine to sit: Supervision Sit to supine: Supervision;Modified independent (Device/Increase time)   General bed mobility comments: requires less assistace and able to complete supine<>sitting with bed flat  Transfers Overall transfer level: Needs assistance Equipment used: Rolling walker (2 wheeled);1 person hand held assist Transfers: Sit to/from [facility] Sit to Stand: Supervision Stand pivot transfers: Min guard       General transfer comment: has to lean on nearby objects for support when not using RW due to generalized weakness  Ambulation/Gait Ambulation/Gait assistance: Supervision;Min guard Gait Distance (Feet): 120 Feet Assistive device: Rolling walker (2 wheeled) Gait Pattern/deviations: Step-through pattern;Decreased step length - right;Decreased step length - left;Decreased stride length;Trunk flexed;Wide base of support Gait velocity: decreased   General Gait Details: slightly labored slow cadence without loss of balance, limited mostly due to c/o fatigue   Stairs             Wheelchair Mobility    Modified Rankin (Stroke Patients Only)       Balance Overall balance assessment: Needs assistance Sitting-balance support: Feet supported;No upper extremity supported Sitting balance-Leahy Scale: Good Sitting balance - Comments: seated at EOB   Standing balance support: During functional activity;No  upper extremity supported Standing balance-Leahy Scale: Poor Standing balance comment:  fair/poor without AD, fair using RW                            Cognition Arousal/Alertness: Awake/alert Behavior During Therapy: WFL for tasks assessed/performed Overall Cognitive Status: Within Functional Limits for tasks assessed                                        Exercises General Exercises - Lower Extremity Long Arc Quad: Seated;AROM;Strengthening;Both;10 reps Hip Flexion/Marching: Seated;AROM;Strengthening;Both;10 reps Toe Raises: Seated;AROM;Strengthening;Both;15 reps Heel Raises: Seated;AROM;Strengthening;Both;15 reps    General Comments        Pertinent Vitals/Pain Pain Assessment: Faces Faces Pain Scale: Hurts little more Pain Location: stomach discomfort Pain Descriptors / Indicators: Discomfort;Aching Pain Intervention(s): Limited activity within patient's tolerance;Monitored during session    Home Living                      Prior Function            PT Goals (current goals can now be found in the care plan section) Acute Rehab PT Goals Patient Stated Goal: return home with family to assist PT Goal Formulation: With patient Time For Goal Achievement: 11/30/19 Potential to Achieve Goals: Good Progress towards PT goals: Progressing toward goals    Frequency    Min 3X/week      PT Plan Discharge plan needs to be updated    Co-evaluation              AM-PAC PT "6 Clicks" Mobility   Outcome Measure  Help needed turning from your back to your side while in a flat bed without using bedrails?: None Help needed moving from lying on your back to sitting on the side of a flat bed without using bedrails?: None Help needed moving to and from a bed to a chair (including a wheelchair)?: A Little Help needed standing up from a chair using your arms (e.g., wheelchair or bedside chair)?: A Little Help needed to walk in hospital  room?: A Little Help needed climbing 3-5 steps with a railing? : A Little 6 Click Score: 20    End of Session   Activity Tolerance: Patient tolerated treatment well;Patient limited by fatigue Patient left: in chair;with call bell/phone within reach Nurse Communication: Mobility status PT Visit Diagnosis: Unsteadiness on feet (R26.81);Other abnormalities of gait and mobility (R26.89);Muscle weakness (generalized) (M62.81)     Time: 2707-8675 PT Time Calculation (min) (ACUTE ONLY): 25 min  Charges:  $Gait Training: 8-22 mins $Therapeutic Exercise: 8-22 mins                     12:29 PM, 11/17/19 Lonell Grandchild, MPT Physical Therapist with [facility] [phone] office [phone] mobile phone

## 2019-11-17 NOTE — Care Management Important Message (Signed)
Important Message  Patient Details  Name: [name] MRN: [id number] Date of Birth: 15-Feb-1931   Medicare Important Message Given:  Yes     Tommy Medal 11/17/2019, 1:57 PM

## 2019-11-17 NOTE — H&P (View-Only) (Signed)
Referring Provider: Dr. Roderic Palau  Primary Care Physician:  [name], DO Primary Gastroenterologist:  Dr. Henrene Pastor   Date of Admission:  Date of Consultation:   Reason for Consultation:  Postprandial abdominal pain   HPI:  [name] is an 86 y.o. year old female with numerous comorbidities, history of DVT, history of afib on Eliquis, presenting this admission with weakness and dizziness, abdominal pain with decreased appetite, early satiety, bloating, and changes in bowel habits.  CT renal stone study this admission with mild appendiceal wall thickening noted, no significant inflammatory fat stranding. Other findings as below. Biliary stones or sludge on CTs. Started on Linzess with improvement in bowel habits, now stopped and only taking Miralax as needed. Surgery has seen patient and not felt dealing with early appendicitis.    Notes tightness, upper abdominal discomfort for 2-3 weeks. Seen in Urgent Care Nov 03, 2019. Pain is worse with eating. She's worried about her heart being the culprit. Feels pressure in chest at times.  Notes early satiety. Had pain at lunch today with chicken broth, juice. Feels improved somewhat since starting Bentyl but still with symptoms. Denies constipation. Was having looser stool prior to these symptoms started, then was dealing with constipation. Intermittent nausea but not severe. Has taken Mylanta a few times with only mild improvement. Protonix once daily. No overt GI bleeding. Believes she has lost 50 lbs over the past year, after Covid.   LFTs on 10/18: AST 49, ALT 38. Tbili 0.5, Alk Phos 113 outside labs.   Weighed 200 prior to COVID, which she had after Thanksgiving.   Wt 63.2 kg in Oct 2021.   Wt 65.3 kg in Feb 2021.   Wt 68.2 kg in Oct 2020.    Prior GI evaluation:  Per Dr. Blanch Media last note in 2018, EGD in July 2018 on anticoagulation was normal. History of IDA. Chronic GI complaints including vague abdominal discomfort,  intermittent diarrhea, dating back several years.   Prior colonoscopies in 2003, 2007, 2012 (no polyps. Pathology with microscopic colitis). Virtual colonoscopy in 2016 negative. Capsule study unrevealing in 2018.,   CT Dec 2020 with contrast with 1 cm hypodense focus in neck of pancreas, possibly side branch IPMN. CT renal stone study this admission with mild appendiceal wall thickening noted, no significant inflammatory fat stranding. Other findings as below. Biliary stones or sludge on CTs.   Past Medical History:  Diagnosis Date  . AICD (automatic cardioverter/defibrillator) present   . Allergy    SESONAL  . Anxiety   . ARTHRITIS   . Arthritis   . Atrial fibrillation ([place])   . CAD (coronary artery disease)    a. history of cardiac arrest 1993;  b. s/p LAD/LCX stenting in 2003;  c. 07/2011 Cath: LM nl, LAD patent stent, D1 80ost, LCX patent stent, RCA min irregs;  d. 04/2012 MV: EF 66%, no ishcemia;  e. 12/2013 Echo: Ef 55%, no rwma, Gr 1 DD, triv AI/MR, mildly dil LA;  f. 12/2013 Lexi MV: intermediate risk - apical ischemia and inf/infsept fixed defect, ? artifactual.  . CAD in native artery 12/31/2013   Previous stents to LAD and Ramus patent on cath today 12/31/13 also There is severe disease in the ostial first diagonal which is unchanged from most recent cardiac catheterization. The right coronary artery could not be engaged selectively but nonselective angiography showed no significant disease in the proximal and midsegment.     . Cataract    DENIES  .  Chronic back pain   . Chronic sinus bradycardia   . CKD (chronic kidney disease), stage III ([place])   . Clotting disorder (HCC)    DVT  . COLITIS 12/02/2007   Qualifier: Diagnosis of  By: Nils Pyle CMA ([place]), [name]    . Diabetes mellitus without complication ([place])    DENIES  . Diverticulosis of colon (without mention of hemorrhage) 2007   Colonoscopy   . Esophageal stricture    a. 2012 s/p dil.  . Esophagitis, unspecified    a.  2012 EGD  . EXTERNAL HEMORRHOIDS   . GERD (gastroesophageal reflux disease)    omeprazole  . H/O hiatal hernia   . Hiatal hernia    a. 2012 EGD.  Marland Kitchen History of DVT in the past, not on Coumadin now    left  leg  . HYPERCHOLESTEROLEMIA   . Hypertension   . ICD (implantable cardiac defibrillator) in place    a. s/p initial ICD in 1993 in setting of cardiac arrest;  b. 01/2007 gen change: Guidant T135 Vitality DS VR single lead ICD.  . Macular degeneration    gets injecton in eye every 5 weeks- last injection - 05/03/2013   . Myocardial infarction ([place]) 1993  . Near syncope 05/22/2015  . Nephrolithiasis, just saw Dr [name]   . Orthostatic hypotension   . Peripheral vascular disease ([place])    ???  . RECTAL BLEEDING 12/03/2007   Qualifier: Diagnosis of  By: Sharlett Iles MD Byrd Hesselbach   . Unspecified gastritis and gastroduodenitis without mention of hemorrhage    a. 2003 EGD->not noted on 2012 EGD.  Marland Kitchen Unstable angina, neg MI, cath stable.maybe GI 12/30/2013    Past Surgical History:  Procedure Laterality Date  . BREAST BIOPSY Left 11/2013  . CARDIAC CATHETERIZATION    . CARDIAC CATHETERIZATION  01/01/15   patent stents -there is severe disease in ostial 1st diag but without change.   Marland Kitchen CARDIAC DEFIBRILLATOR PLACEMENT  02/05/2007   Guidant  . CARDIOVASCULAR STRESS TEST  12/30/2013   abnormal  . CARDIOVERSION N/A 02/13/2019   Procedure: CARDIOVERSION;  Surgeon: Sanda Klein, MD;  Location: [facility];  Service: Cardiovascular;  Laterality: N/A;  . CATARACT EXTRACTION Right    Dr. Katy Fitch  . COLONOSCOPY    . coronary stents     . CYSTOSCOPY WITH URETEROSCOPY Right 05/20/2013   Procedure: CYSTOSCOPY, RIGHT URETEROSCOPY STONE EXTRACTION, Insertion of right DOUBLE J STENT ;  Surgeon: Irine Seal, MD;  Location: [facility];  Service: Urology;  Laterality: Right;  . CYSTOSCOPY WITH URETEROSCOPY AND STENT PLACEMENT N/A 06/03/2013   Procedure: SECOND LOOK CYSTOSCOPY WITH URETEROSCOPY  HOLMIUM  LASER LITHO AND STONE EXTRACTION Sammie Bench ;  Surgeon: Malka So, MD;  Location: [facility];  Service: Urology;  Laterality: N/A;  . GIVENS CAPSULE STUDY N/A 10/30/2016   Procedure: GIVENS CAPSULE STUDY;  Surgeon: Irene Shipper, MD;  Location: [facility];  Service: Endoscopy;  Laterality: N/A;  NEEDS TO BE ADMITTED FOR OBSERVATION PT. HAS DEFIB  . HEMORROIDECTOMY  80's  . HOLMIUM LASER APPLICATION Right 02/22/9483   Procedure: HOLMIUM LASER APPLICATION;  Surgeon: Irine Seal, MD;  Location: [facility];  Service: Urology;  Laterality: Right;  . HYSTEROSCOPY WITH D & C  09/02/2010   Procedure: DILATATION AND CURETTAGE (D&C) /HYSTEROSCOPY;  Surgeon: Margarette Asal;  Location: [facility];  Service: Gynecology;  Laterality: N/A;  Dilation and Curettage with Hysteroscopy and Polypectomy  . IMPLANTABLE CARDIOVERTER DEFIBRILLATOR (ICD) GENERATOR CHANGE N/A  02/10/2014   Procedure: ICD GENERATOR CHANGE;  Surgeon: Sanda Klein, MD;  Location: [facility];  Service: Cardiovascular;  Laterality: N/A;  . LEFT HEART CATHETERIZATION WITH CORONARY ANGIOGRAM N/A 07/28/2011   Procedure: LEFT HEART CATHETERIZATION WITH CORONARY ANGIOGRAM;  Surgeon: Lorretta Harp, MD;  Location: [facility] C[facility] LAB;  Service: Cardiovascular;  Laterality: N/A;  . LEFT HEART CATHETERIZATION WITH CORONARY ANGIOGRAM N/A 12/31/2013   Procedure: LEFT HEART CATHETERIZATION WITH CORONARY ANGIOGRAM;  Surgeon: Wellington Hampshire, MD;  Location: [facility] C[facility];  Service: Cardiovascular;  Laterality: N/A;    Prior to Admission medications   Medication Sig Start Date End Date Taking? Authorizing Provider  amiodarone (PACERONE) 200 MG tablet Take 200 mg by mouth daily. 10/24/19  Yes [provider]  apixaban (ELIQUIS) 5 MG TABS tablet Take 1 tablet (5 mg total) by mouth 2 (two) times daily. 10/07/19  Yes Croitoru, Mihai, MD  carboxymethylcellulose (REFRESH PLUS) 0.5 % SOLN Place 1 drop into both eyes 3 (three) times daily as needed (dry/irritated eyes.).    Yes [provider]  Cholecalciferol (VITAMIN D) 50 MCG (2000 UT) CAPS Take 1 capsule by mouth daily.   Yes [provider]  diphenoxylate-atropine (LOMOTIL) 2.5-0.025 MG tablet TAKE 1 TABLET EVERY 8 HOURS AS NEEDED FOR DIARRHEA Patient taking differently: Take 1 tablet by mouth every 8 (eight) hours as needed for diarrhea or loose stools.  03/25/19  Yes Gottschalk, Leatrice Jewels M, DO  hydrALAZINE (APRESOLINE) 10 MG tablet TAKE 1 TABLET EVERY 8 HOURS Patient taking differently: Take 10 mg by mouth every 8 (eight) hours.  10/09/19  Yes Croitoru, Mihai, MD  isosorbide mononitrate (IMDUR) 30 MG 24 hr tablet TAKE ONE HALF (1/2) TABLET DAILY Patient taking differently: Take 15 mg by mouth daily.  09/23/19  Yes Croitoru, Mihai, MD  levothyroxine (SYNTHROID) 75 MCG tablet Take 1 tablet (75 mcg total) by mouth daily before breakfast. 09/01/19  Yes Gottschalk, Ashly M, DO  loratadine (CLARITIN) 10 MG tablet Take 1 tablet (10 mg total) by mouth daily. (for itching) Patient taking differently: Take 10 mg by mouth as needed for allergies or itching.  01/20/19  Yes Gottschalk, Leatrice Jewels M, DO  meclizine (ANTIVERT) 25 MG tablet Take 25 mg by mouth 3 (three) times daily as needed for dizziness or nausea.  07/22/19  Yes [provider]  metoprolol tartrate (LOPRESSOR) 25 MG tablet TAKE  (1)  TABLET TWICE A DAY. Patient taking differently: Take 25 mg by mouth daily as needed (Pressure in chest).  09/23/19  Yes Croitoru, Mihai, MD  Multiple Vitamins-Minerals (CENTRUM SILVER ULTRA WOMENS PO) Take 1 tablet by mouth daily.   Yes [provider]  nitroGLYCERIN (NITROSTAT) 0.4 MG SL tablet Place 1 tablet (0.4 mg total) under the tongue every 5 (five) minutes as needed for chest pain. 12/20/18  Yes Claretta Fraise, MD  pantoprazole (PROTONIX) 40 MG tablet Take 1 tablet (40 mg total) by mouth every evening. Patient taking differently: Take 40 mg by mouth daily.  02/21/19  Yes Gottschalk, Ashly M, DO  PARoxetine  (PAXIL) 20 MG tablet TAKE (1) TABLET DAILY IN THE MORNING. Patient taking differently: Take 20 mg by mouth daily.  05/19/19  Yes Gottschalk, Ashly M, DO  rosuvastatin (CRESTOR) 40 MG tablet TAKE (1/2) TABLET DAILY. Patient taking differently: Take 20 mg by mouth daily.  10/06/19  Yes Gottschalk, Leatrice Jewels M, DO  torsemide (DEMADEX) 20 MG tablet Take 1 tablet (20 mg total) by mouth daily as needed (edema). 04/01/19  Yes  Ronnie Doss M, DO  amiodarone (PACERONE) 100 MG tablet Take 1 tablet (100 mg total) by mouth daily. Patient not taking: Reported on 11/15/2019 09/30/19   Croitoru, Mihai, MD  Vitamin D, Ergocalciferol, (DRISDOL) 1.25 MG (50000 UNIT) CAPS capsule TAKE 1 CAPSULE ONCE EVERY 7 DAYS Patient not taking: Reported on 11/15/2019 07/30/19   [name], DO    Current Facility-Administered Medications  Medication Dose Route Frequency Provider Last Rate Last Admin  . 0.9 %  sodium chloride infusion   Intravenous Continuous Reubin Milan, MD 75 mL/hr at 11/17/19 1251 New Bag at 11/17/19 1251  . acetaminophen (TYLENOL) tablet 650 mg  650 mg Oral Q6H PRN Kathie Dike, MD   650 mg at 11/17/19 0826  . amiodarone (PACERONE) tablet 200 mg  200 mg Oral Daily Kathie Dike, MD   200 mg at 11/17/19 0814  . apixaban (ELIQUIS) tablet 5 mg  5 mg Oral BID Kathie Dike, MD   5 mg at 11/17/19 0814  . dicyclomine (BENTYL) capsule 10 mg  10 mg Oral TID AC Memon, Jolaine Artist, MD      . hydrALAZINE (APRESOLINE) tablet 10 mg  10 mg Oral Q8H Kathie Dike, MD   10 mg at 11/17/19 8657  . hydrALAZINE (APRESOLINE) tablet 25 mg  25 mg Oral Q6H PRN Reubin Milan, MD   25 mg at 11/15/19 0549  . hydrocortisone (ANUSOL-HC) suppository 25 mg  25 mg Rectal BID Kathie Dike, MD   25 mg at 11/17/19 0814  . isosorbide mononitrate (IMDUR) 24 hr tablet 15 mg  15 mg Oral Daily Kathie Dike, MD   15 mg at 11/17/19 0815  . levothyroxine (SYNTHROID) tablet 75 mcg  75 mcg Oral QAC breakfast Kathie Dike, MD   75 mcg at 11/17/19 0814  . LORazepam (ATIVAN) tablet 0.25 mg  0.25 mg Oral TID PRN Kathie Dike, MD   0.25 mg at 11/16/19 1624  . meclizine (ANTIVERT) tablet 25 mg  25 mg Oral TID Kathie Dike, MD   25 mg at 11/17/19 0814  . pantoprazole (PROTONIX) EC tablet 40 mg  40 mg Oral Daily Kathie Dike, MD   40 mg at 11/17/19 0814  . PARoxetine (PAXIL) tablet 20 mg  20 mg Oral Daily Kathie Dike, MD   20 mg at 11/17/19 0815  . polyethylene glycol (MIRALAX / GLYCOLAX) packet 17 g  17 g Oral Daily PRN Kathie Dike, MD      . prochlorperazine (COMPAZINE) injection 5 mg  5 mg Intravenous Q6H PRN Reubin Milan, MD      . rosuvastatin (CRESTOR) tablet 20 mg  20 mg Oral Daily Kathie Dike, MD   20 mg at 11/17/19 0815    Allergies as of 11/14/2019 - Review Complete 11/14/2019  Allergen Reaction Noted  . Sotalol Other (See Comments) 11/01/2016  . Bactrim [sulfamethoxazole-trimethoprim]  08/19/2016  . Ciprofloxacin Nausea Only 02/05/2018  . Actos [pioglitazone] Swelling 07/31/2012  . Adhesive [tape] Other (See Comments) 02/20/2014  . Contrast media [iodinated diagnostic agents] Other (See Comments) 07/25/2011  . Latex Rash 05/29/2013  . Lipitor [atorvastatin] Other (See Comments) 07/31/2012    Family History  Problem Relation Age of Onset  . Stroke Mother   . Other Mother        brain tumor  . Other Father        MI  . Heart attack Father   . Stroke Sister   . Macular degeneration Sister   . Diabetes Daughter   .  Cancer Daughter        ovarian  . Colon polyps Daughter   . Atrial fibrillation Sister   . Hyperlipidemia Sister   . Osteoporosis Sister   . Stroke Sister   . Uterine cancer Sister   . Heart attack Brother   . Heart disease Brother   . Asthma Brother   . Breast cancer Neg Hx     Social History   Socioeconomic History  . Marital status: Widowed    Spouse name: Not on file  . Number of children: 4  . Years of education: 8  . Highest  education level: 8th grade  Occupational History  . Occupation: Retired  Tobacco Use  . Smoking status: Never Smoker  . Smokeless tobacco: Never Used  Vaping Use  . Vaping Use: Never used  Substance and Sexual Activity  . Alcohol use: No  . Drug use: No  . Sexual activity: Never    Birth control/protection: Post-menopausal  Other Topics Concern  . Not on file  Social History Narrative  . Not on file   Social Determinants of Health   Financial Resource Strain: Low Risk   . Difficulty of Paying Living Expenses: Not hard at all  Food Insecurity: No Food Insecurity  . Worried About Charity fundraiser in the Last Year: Never true  . Ran Out of Food in the Last Year: Never true  Transportation Needs: No Transportation Needs  . Lack of Transportation (Medical): No  . Lack of Transportation (Non-Medical): No  Physical Activity: Inactive  . Days of Exercise per Week: 0 days  . Minutes of Exercise per Session: 0 min  Stress: No Stress Concern Present  . Feeling of Stress : Only a little  Social Connections: Moderately Integrated  . Frequency of Communication with Friends and Family: More than three times a week  . Frequency of Social Gatherings with Friends and Family: Once a week  . Attends Religious Services: More than 4 times per year  . Active Member of Clubs or Organizations: Yes  . Attends Archivist Meetings: More than 4 times per year  . Marital Status: Widowed  Intimate Partner Violence: Not At Risk  . Fear of Current or Ex-Partner: No  . Emotionally Abused: No  . Physically Abused: No  . Sexually Abused: No    Review of Systems: Gen: see HPI CV: Denies chest pain, heart palpitations, syncope, edema  Resp: Denies shortness of breath with rest, cough, wheezing GI: see HPI GU : Denies urinary burning, urinary frequency, urinary incontinence.  MS: Denies joint pain,swelling, cramping Derm: Denies rash, itching, dry skin Psych: Denies depression,  anxiety,confusion, or memory loss Heme: Denies bruising, bleeding, and enlarged lymph nodes.  Physical Exam: Vital signs in last 24 hours: Temp:  [98 F (36.7 C)-98.6 F (37 C)] 98.6 F (37 C) (11/01 0541) Pulse Rate:  [60-72] 72 (11/01 0541) Resp:  [18] 18 (11/01 0541) BP: (140-166)/(62-84) 140/62 (11/01 0541) SpO2:  [97 %] 97 % (11/01 0541) Last BM Date: 11/17/19 General:   Alert,  Well-developed, well-nourished, pleasant and cooperative in NAD Head:  Normocephalic and atraumatic. Eyes:  Sclera clear, no icterus.   Conjunctiva pink. Ears:  Normal auditory acuity. Nose:  No deformity, discharge,  or lesions. Mouth:  No deformity or lesions, dentition normal. Lungs:  Clear throughout to auscultation.    Heart:  S1 S2 present Abdomen:  Soft, mild TTP upper abdomen and LLQ,  No masses, hepatosplenomegaly . Normal bowel sounds, without  guarding, and without rebound.   Rectal:  Deferred  Msk:  Symmetrical without gross deformities. Normal posture. Extremities:  Without edema. Neurologic:  Alert and  oriented x4 Psych:  Alert and cooperative. Normal mood and affect.  Intake/Output from previous day: 10/31 0701 - 11/01 0700 In: -  Out: 200 [Urine:200] Intake/Output this shift: No intake/output data recorded.  Lab Results: Recent Labs    11/14/19 2156 11/15/19 0422 11/17/19 0421  WBC 5.5 6.2 4.8  HGB 12.8 12.4 11.3*  HCT 39.7 38.2 34.6*  PLT 249 242 203   BMET Recent Labs    11/15/19 0422 11/16/19 0605 11/17/19 0421  NA 139 135 138  K 3.9 4.3 3.7  CL 98 102 109  CO2 28 25 21*  GLUCOSE 115* 107* 90  BUN _0 CREATININE 1.35* 1.47* 1.20*  CALCIUM 10.2 8.6* 8.6*   Lab Results  Component Value Date   ALT 27 11/17/2019   AST 33 11/17/2019   ALKPHOS 56 11/17/2019   BILITOT 0.9 11/17/2019    LFT Recent Labs    11/14/19 2156 11/15/19 0422  PROT 7.1 7.3  ALBUMIN 3.9 4.0  AST 41 38  ALT 33 31  ALKPHOS 72 67  BILITOT 0.7 0.9   PT/INR Recent Labs     11/14/19 2156  LABPROT 15.3*  INR 1.3*    Impression: Very pleasant 86 year old female admitted with abdominal pain, initially CT renal stone study completed and concern for early appendicitis; however, Surgery felt not dealing with appendicitis. Some improvement with Linzess, resolving constipation and now taking Miralax as needed. Bentyl was also started with mild improvement in symptoms although persisting.  With constellation of symptoms including early satiety, documented weight loss, postprandial abdominal pain, need to assess for chronic mesenteric ischemia. Best would be to perform CTA, which we will order for tomorrow pending continued improvement in renal function. If needed, could pursue mesenteric duplex study. Doubt biliary etiology; although she had mildly elevated transaminases transiently a few weeks ago, LFTs are remaining normal. Some question of stones/sludge on CT. May ultimately need EGD but could be done as outpatient with primary GI. Last in 2018 normal.   Plan: CTA tomorrow pending continued renal improvement Continue Protonix daily If CTA unrevealing, follow-up with [facility] in [facility] for EGD consideration Will continue to follow with you   Annitta Needs, PhD, ANP-BC [facility]    Outpatient egd   LOS: 2 days    11/17/2019, 3:34 PM

## 2019-11-17 NOTE — Progress Notes (Signed)
   11/17/19 1612  Assess: MEWS Score  Temp 97.7 F (36.5 C)  BP (!) 204/93  Pulse Rate 65  Resp 18  Level of Consciousness Alert  SpO2 100 %  O2 Device Room Air  Assess: MEWS Score  MEWS Temp 0  MEWS Systolic 2  MEWS Pulse 0  MEWS RR 0  MEWS LOC 0  MEWS Score 2  MEWS Score Color Yellow  Assess: if the MEWS score is Yellow or Red  Were vital signs taken at a resting state? Yes  Focused Assessment Change from prior assessment (see assessment flowsheet)  Early Detection of Sepsis Score *See Row Information* Low  MEWS guidelines implemented *See Row Information* No, vital signs rechecked  Treat  MEWS Interventions Administered scheduled meds/treatments  Pain Scale 0-10  Pain Score 0  Take Vital Signs  Increase Vital Sign Frequency  Yellow: Q 2hr X 2 then Q 4hr X 2, if remains yellow, continue Q 4hrs  Escalate  MEWS: Escalate Yellow: discuss with charge nurse/RN and consider discussing with provider and RRT  Notify: Charge Nurse/RN  Name of Charge Nurse/RN Notified L Kirsty Monjaraz RN   Date Charge Nurse/RN Notified 11/17/19  Time Charge Nurse/RN Notified 1612  Notify: Provider  Provider Name/Title Dr. Roderic Palau  Date Provider Notified 11/17/19  Time Provider Notified 1612  Notification Type Page  Notification Reason Other (Comment) (elevated B/P )  Response No new orders  Date of Provider Response 11/17/19  Time of Provider Response 1612  Document  Patient Outcome Other (Comment)  Progress note created (see row info) Yes

## 2019-11-17 NOTE — Progress Notes (Signed)
PROGRESS NOTE    [name]  JEH:[id number] DOB: 08-Aug-1931 DOA: 11/14/2019 PCP: [name], DO    Brief Narrative:  [name] is a 86 y.o. female with medical history significant of atrial fibrillation, history of cardioversion, AICD placement, history of CAD, history of MI, chronic systolic heart failure with most recent EF measurement of 40 to 45%,  history of near syncopal episode, orthostatic hypotension episodes, hypertension, anxiety, depression, osteoarthritis, cataracts, chronic back pain, stage III chronic kidney disease, history of DVT, diet controlled type 2 diabetes, diverticulosis, history of esophageal stricture, history of esophagitis, GERD, hiatal hernia, history of rectal bleeding, hyperlipidemia, macular degeneration, nephrolithiasis who is brought to the emergency department via EMS due to weakness and dizziness for the past 2 weeks.  The patient thinks that she may have had some chest pain, but does not have any symptomatology anymore.  She denies pain radiation, diaphoresis, palpitations, orthopnea or pitting edema of the lower extremities.  The patient also complains that she has been having abdominal pain with decreased appetite, early satiety, bloating sensation and change in bowel habits for the past 2 weeks.  She denies nausea, vomiting, diarrhea, melena or hematochezia.  No dysuria, frequency or hematuria.  She denies trauma sore throat, rhinorrhea, productive cough, dyspnea, wheezing or hemoptysis.   Assessment & Plan:   Principal Problem:   Abdominal pain Active Problems:   History of DVT in the past, on eliquis now   Paroxysmal atrial fibrillation, chads2 Vasc2 score of 5, on eliquis   Diabetes mellitus type 2, diet-controlled (HCC)   Essential hypertension   GERD (gastroesophageal reflux disease)   Hyperlipidemia   CAD (coronary artery disease)   Hypothyroidism   Hyponatremia   Volume depletion   Vertigo   Advanced care  planning/counseling discussion   Abdominal pain -CT abdomen indicated questionable appendicitis -Seen by general surgery this was felt to be unlikely -She does not have any fever or leukocytosis -She did describe having periods of diarrhea, but in the past 2 weeks was on the more constipated side -She was started on MiraLAX and Linzess with multiple bowel movements -She continues to have abdominal pain after eating and drinking -Since she is having frequent stools, MiraLAX and Linzess have been discontinued -She has been started on Bentyl for possible IBS component -Her CT did describe several areas of arterial calcifications -Question mesenteric ischemia -We will consult GI for further input  Vertigo -She had similar episodes in the past, but symptoms appear to be worse over the past few weeks -She does have declined in systolic blood pressure from 170s to 140s on standing -MRI imaging not an option due to defibrillator -Started on meclizine and as needed benzos -Overall dizziness has improved  Hyponatremia -Suspect related to decreased p.o. intake -Improved with hydration  Paroxysmal atrial fibrillation -Anticoagulated with Eliquis -Followed at the [facility] -Started on amiodarone recently  Diabetes, diet controlled -Monitor blood sugars  Hypertension -Resume home doses of hydralazine -Blood pressure currently stable -Use IV hydralazine as needed for high blood pressures  Hypothyroidism. -Resume home dose of Synthroid  Coronary artery disease -No complaints of chest pain -Continue statin, Imdur  Advance care planning -Discussed CODE STATUS with patient -She agrees to DNR -[name] was present for the conversation  Generalized weakness -Seen by physical therapy with recommendations for home health physical therapy  DVT prophylaxis:  apixaban (ELIQUIS) tablet 5 mg  Code Status: DNR Family Communication: Discussed with daughter-in-law at  the bedside Disposition  Plan: Status is: Inpatient  Remains inpatient appropriate because:Ongoing diagnostic testing needed not appropriate for outpatient work up and Unsafe d/c plan   Dispo: The patient is from: Home              Anticipated d/c is to: Home              Anticipated d/c date is: 2 days              Patient currently is not medically stable to d/c.    Consultants:     Procedures:     Antimicrobials:       Subjective: Reports continued loose stools and abdominal pain after eating.  She reports pressure in her rectum.  Overall dizziness is better.  Objective: Vitals:   11/16/19 2028 11/17/19 0541 11/17/19 1612 11/17/19 1700  BP: (!) 166/84 140/62 (!) 204/93 (!) 201/92  Pulse: 60 72 65 66  Resp: 18 18 18 18   Temp: 98 F (36.7 C) 98.6 F (37 C) 97.7 F (36.5 C) 97.7 F (36.5 C)  TempSrc: Oral Oral Oral Oral  SpO2: 97% 97% 100% 100%  Weight:      Height:       No intake or output data in the 24 hours ending 11/17/19 1908 Filed Weights   11/14/19 2124 11/15/19 0920  Weight: 63.1 kg 63.2 kg    Examination:  General exam: Appears calm and comfortable  Respiratory system: Clear to auscultation. Respiratory effort normal. Cardiovascular system: S1 & S2 heard, RRR. No JVD, murmurs, rubs, gallops or clicks. No pedal edema. Gastrointestinal system: Abdomen is nondistended, soft and mild tenderness in the lower abdomen, mostly in left lower quadrant. No organomegaly or masses felt. Normal bowel sounds heard. Central nervous system: Alert and oriented. No focal neurological deficits. Extremities: Symmetric 5 x 5 power. Skin: No rashes, lesions or ulcers Psychiatry: Judgement and insight appear normal. Mood & affect appropriate.     Data Reviewed: I have personally reviewed following labs and imaging studies  CBC: Recent Labs  Lab 11/14/19 2156 11/15/19 0422 11/17/19 0421  WBC 5.5 6.2 4.8  NEUTROABS 3.4 3.1  --   HGB 12.8 12.4 11.3*  HCT 39.7  38.2 34.6*  MCV 96.8 96.2 98.3  PLT 249 242 295   Basic Metabolic Panel: Recent Labs  Lab 11/14/19 2156 11/15/19 0422 11/16/19 0605 11/17/19 0421  NA 133* 139 135 138  K 3.8 3.9 4.3 3.7  CL 97* 98 102 109  CO2 26 28 25  21*  GLUCOSE 121* 115* 107* 90  BUN 23 21 19 13   CREATININE 1.55* 1.35* 1.47* 1.20*  CALCIUM 9.4 10.2 8.6* 8.6*  MG 2.2  --   --   --    GFR: Estimated Creatinine Clearance: 30.1 mL/min (A) (by C-G formula based on SCr of 1.2 mg/dL (H)). Liver Function Tests: Recent Labs  Lab 11/14/19 2156 11/15/19 0422 11/17/19 0421  AST 41 38 33  ALT 33 31 27  ALKPHOS 72 67 56  BILITOT 0.7 0.9 0.9  PROT 7.1 7.3 5.9*  ALBUMIN 3.9 4.0 3.2*   No results for input(s): LIPASE, AMYLASE in the last 168 hours. No results for input(s): AMMONIA in the last 168 hours. Coagulation Profile: Recent Labs  Lab 11/14/19 2156  INR 1.3*   Cardiac Enzymes: No results for input(s): CKTOTAL, CKMB, CKMBINDEX, TROPONINI in the last 168 hours. BNP (last 3 results) No results for input(s): PROBNP in the last 8760 hours. HbA1C: No results for input(s): HGBA1C  in the last 72 hours. CBG: Recent Labs  Lab 11/16/19 1104 11/16/19 1623 11/16/19 2327 11/17/19 0539 11/17/19 1130  GLUCAP 147* 110* 105* 85 171*   Lipid Profile: No results for input(s): CHOL, HDL, LDLCALC, TRIG, CHOLHDL, LDLDIRECT in the last 72 hours. Thyroid Function Tests: No results for input(s): TSH, T4TOTAL, FREET4, T3FREE, THYROIDAB in the last 72 hours. Anemia Panel: No results for input(s): VITAMINB12, FOLATE, FERRITIN, TIBC, IRON, RETICCTPCT in the last 72 hours. Sepsis Labs: No results for input(s): PROCALCITON, LATICACIDVEN in the last 168 hours.  Recent Results (from the past 240 hour(s))  Respiratory Panel by RT PCR (Flu A&B, Covid) - Nasopharyngeal Swab     Status: None   Collection Time: 11/15/19  3:13 AM   Specimen: Nasopharyngeal Swab  Result Value Ref Range Status   SARS Coronavirus 2 by RT PCR  NEGATIVE NEGATIVE Final    Comment: (NOTE) SARS-CoV-2 target nucleic acids are NOT DETECTED.  The SARS-CoV-2 RNA is generally detectable in upper respiratoy specimens during the acute phase of infection. The lowest concentration of SARS-CoV-2 viral copies this assay can detect is 131 copies/mL. A negative result does not preclude SARS-Cov-2 infection and should not be used as the sole basis for treatment or other patient management decisions. A negative result may occur with  improper specimen collection/handling, submission of specimen other than nasopharyngeal swab, presence of viral mutation(s) within the areas targeted by this assay, and inadequate number of viral copies (<131 copies/mL). A negative result must be combined with clinical observations, patient history, and epidemiological information. The expected result is Negative.  Fact Sheet for Patients:  PinkCheek.be  Fact Sheet for Healthcare Providers:  GravelBags.it  This test is no t yet approved or cleared by the [place] FDA and  has been authorized for detection and/or diagnosis of SARS-CoV-2 by FDA under an Emergency Use Authorization (EUA). This EUA will remain  in effect (meaning this test can be used) for the duration of the COVID-19 declaration under Section 564(b)(1) of the Act, 21 U.S.C. section 360bbb-3(b)(1), unless the authorization is terminated or revoked sooner.     Influenza A by PCR NEGATIVE NEGATIVE Final   Influenza B by PCR NEGATIVE NEGATIVE Final    Comment: (NOTE) The Xpert Xpress SARS-CoV-2/FLU/RSV assay is intended as an aid in  the diagnosis of influenza from Nasopharyngeal swab specimens and  should not be used as a sole basis for treatment. Nasal washings and  aspirates are unacceptable for Xpert Xpress SARS-CoV-2/FLU/RSV  testing.  Fact Sheet for Patients: PinkCheek.be  Fact Sheet for  Healthcare Providers: GravelBags.it  This test is not yet approved or cleared by the [place] FDA and  has been authorized for detection and/or diagnosis of SARS-CoV-2 by  FDA under an Emergency Use Authorization (EUA). This EUA will remain  in effect (meaning this test can be used) for the duration of the  Covid-19 declaration under Section 564(b)(1) of the Act, 21  U.S.C. section 360bbb-3(b)(1), unless the authorization is  terminated or revoked. Performed at [facility], [address] [place], [place] [address]          Radiology Studies: No results found.      Scheduled Meds: . amiodarone  200 mg Oral Daily  . apixaban  5 mg Oral BID  . dicyclomine  10 mg Oral TID AC  . hydrALAZINE  10 mg Oral Q8H  . hydrocortisone  25 mg Rectal BID  . isosorbide mononitrate  15 mg Oral Daily  .  levothyroxine  75 mcg Oral QAC breakfast  . meclizine  25 mg Oral TID  . pantoprazole  40 mg Oral Daily  . PARoxetine  20 mg Oral Daily  . rosuvastatin  20 mg Oral Daily   Continuous Infusions:    LOS: 2 days    Time spent: 51mins    Kathie Dike, MD [facility]   If 7PM-7AM, please contact night-coverage [email or web address]  11/17/2019, 7:08 PM

## 2019-11-17 NOTE — Consult Note (Signed)
Referring Provider: Dr. Roderic Palau  Primary Care Physician:  [name], DO Primary Gastroenterologist:  Dr. Henrene Pastor   Date of Admission:  Date of Consultation:   Reason for Consultation:  Postprandial abdominal pain   HPI:  [name] is an 86 y.o. year old female with numerous comorbidities, history of DVT, history of afib on Eliquis, presenting this admission with weakness and dizziness, abdominal pain with decreased appetite, early satiety, bloating, and changes in bowel habits.  CT renal stone study this admission with mild appendiceal wall thickening noted, no significant inflammatory fat stranding. Other findings as below. Biliary stones or sludge on CTs. Started on Linzess with improvement in bowel habits, now stopped and only taking Miralax as needed. Surgery has seen patient and not felt dealing with early appendicitis.    Notes tightness, upper abdominal discomfort for 2-3 weeks. Seen in Urgent Care Nov 03, 2019. Pain is worse with eating. She's worried about her heart being the culprit. Feels pressure in chest at times.  Notes early satiety. Had pain at lunch today with chicken broth, juice. Feels improved somewhat since starting Bentyl but still with symptoms. Denies constipation. Was having looser stool prior to these symptoms started, then was dealing with constipation. Intermittent nausea but not severe. Has taken Mylanta a few times with only mild improvement. Protonix once daily. No overt GI bleeding. Believes she has lost 50 lbs over the past year, after Covid.   LFTs on 10/18: AST 49, ALT 38. Tbili 0.5, Alk Phos 113 outside labs.   Weighed 200 prior to COVID, which she had after Thanksgiving.   Wt 63.2 kg in Oct 2021.   Wt 65.3 kg in Feb 2021.   Wt 68.2 kg in Oct 2020.    Prior GI evaluation:  Per Dr. Blanch Media last note in 2018, EGD in July 2018 on anticoagulation was normal. History of IDA. Chronic GI complaints including vague abdominal discomfort,  intermittent diarrhea, dating back several years.   Prior colonoscopies in 2003, 2007, 2012 (no polyps. Pathology with microscopic colitis). Virtual colonoscopy in 2016 negative. Capsule study unrevealing in 2018.,   CT Dec 2020 with contrast with 1 cm hypodense focus in neck of pancreas, possibly side branch IPMN. CT renal stone study this admission with mild appendiceal wall thickening noted, no significant inflammatory fat stranding. Other findings as below. Biliary stones or sludge on CTs.   Past Medical History:  Diagnosis Date  . AICD (automatic cardioverter/defibrillator) present   . Allergy    SESONAL  . Anxiety   . ARTHRITIS   . Arthritis   . Atrial fibrillation ([place])   . CAD (coronary artery disease)    a. history of cardiac arrest 1993;  b. s/p LAD/LCX stenting in 2003;  c. 07/2011 Cath: LM nl, LAD patent stent, D1 80ost, LCX patent stent, RCA min irregs;  d. 04/2012 MV: EF 66%, no ishcemia;  e. 12/2013 Echo: Ef 55%, no rwma, Gr 1 DD, triv AI/MR, mildly dil LA;  f. 12/2013 Lexi MV: intermediate risk - apical ischemia and inf/infsept fixed defect, ? artifactual.  . CAD in native artery 12/31/2013   Previous stents to LAD and Ramus patent on cath today 12/31/13 also There is severe disease in the ostial first diagonal which is unchanged from most recent cardiac catheterization. The right coronary artery could not be engaged selectively but nonselective angiography showed no significant disease in the proximal and midsegment.     . Cataract    DENIES  .  Chronic back pain   . Chronic sinus bradycardia   . CKD (chronic kidney disease), stage III ([place])   . Clotting disorder (HCC)    DVT  . COLITIS 12/02/2007   Qualifier: Diagnosis of  By: Nils Pyle CMA ([place]), [name]    . Diabetes mellitus without complication ([place])    DENIES  . Diverticulosis of colon (without mention of hemorrhage) 2007   Colonoscopy   . Esophageal stricture    a. 2012 s/p dil.  . Esophagitis, unspecified    a.  2012 EGD  . EXTERNAL HEMORRHOIDS   . GERD (gastroesophageal reflux disease)    omeprazole  . H/O hiatal hernia   . Hiatal hernia    a. 2012 EGD.  Marland Kitchen History of DVT in the past, not on Coumadin now    left  leg  . HYPERCHOLESTEROLEMIA   . Hypertension   . ICD (implantable cardiac defibrillator) in place    a. s/p initial ICD in 1993 in setting of cardiac arrest;  b. 01/2007 gen change: Guidant T135 Vitality DS VR single lead ICD.  . Macular degeneration    gets injecton in eye every 5 weeks- last injection - 05/03/2013   . Myocardial infarction ([place]) 1993  . Near syncope 05/22/2015  . Nephrolithiasis, just saw Dr [name]   . Orthostatic hypotension   . Peripheral vascular disease ([place])    ???  . RECTAL BLEEDING 12/03/2007   Qualifier: Diagnosis of  By: Sharlett Iles MD Byrd Hesselbach   . Unspecified gastritis and gastroduodenitis without mention of hemorrhage    a. 2003 EGD->not noted on 2012 EGD.  Marland Kitchen Unstable angina, neg MI, cath stable.maybe GI 12/30/2013    Past Surgical History:  Procedure Laterality Date  . BREAST BIOPSY Left 11/2013  . CARDIAC CATHETERIZATION    . CARDIAC CATHETERIZATION  01/01/15   patent stents -there is severe disease in ostial 1st diag but without change.   Marland Kitchen CARDIAC DEFIBRILLATOR PLACEMENT  02/05/2007   Guidant  . CARDIOVASCULAR STRESS TEST  12/30/2013   abnormal  . CARDIOVERSION N/A 02/13/2019   Procedure: CARDIOVERSION;  Surgeon: Sanda Klein, MD;  Location: [facility];  Service: Cardiovascular;  Laterality: N/A;  . CATARACT EXTRACTION Right    Dr. Katy Fitch  . COLONOSCOPY    . coronary stents     . CYSTOSCOPY WITH URETEROSCOPY Right 05/20/2013   Procedure: CYSTOSCOPY, RIGHT URETEROSCOPY STONE EXTRACTION, Insertion of right DOUBLE J STENT ;  Surgeon: Irine Seal, MD;  Location: [facility];  Service: Urology;  Laterality: Right;  . CYSTOSCOPY WITH URETEROSCOPY AND STENT PLACEMENT N/A 06/03/2013   Procedure: SECOND LOOK CYSTOSCOPY WITH URETEROSCOPY  HOLMIUM  LASER LITHO AND STONE EXTRACTION Sammie Bench ;  Surgeon: Malka So, MD;  Location: [facility];  Service: Urology;  Laterality: N/A;  . GIVENS CAPSULE STUDY N/A 10/30/2016   Procedure: GIVENS CAPSULE STUDY;  Surgeon: Irene Shipper, MD;  Location: [facility];  Service: Endoscopy;  Laterality: N/A;  NEEDS TO BE ADMITTED FOR OBSERVATION PT. HAS DEFIB  . HEMORROIDECTOMY  80's  . HOLMIUM LASER APPLICATION Right 02/22/9483   Procedure: HOLMIUM LASER APPLICATION;  Surgeon: Irine Seal, MD;  Location: [facility];  Service: Urology;  Laterality: Right;  . HYSTEROSCOPY WITH D & C  09/02/2010   Procedure: DILATATION AND CURETTAGE (D&C) /HYSTEROSCOPY;  Surgeon: Margarette Asal;  Location: [facility];  Service: Gynecology;  Laterality: N/A;  Dilation and Curettage with Hysteroscopy and Polypectomy  . IMPLANTABLE CARDIOVERTER DEFIBRILLATOR (ICD) GENERATOR CHANGE N/A  02/10/2014   Procedure: ICD GENERATOR CHANGE;  Surgeon: Sanda Klein, MD;  Location: [facility];  Service: Cardiovascular;  Laterality: N/A;  . LEFT HEART CATHETERIZATION WITH CORONARY ANGIOGRAM N/A 07/28/2011   Procedure: LEFT HEART CATHETERIZATION WITH CORONARY ANGIOGRAM;  Surgeon: Lorretta Harp, MD;  Location: [facility] C[facility] LAB;  Service: Cardiovascular;  Laterality: N/A;  . LEFT HEART CATHETERIZATION WITH CORONARY ANGIOGRAM N/A 12/31/2013   Procedure: LEFT HEART CATHETERIZATION WITH CORONARY ANGIOGRAM;  Surgeon: Wellington Hampshire, MD;  Location: [facility] CATH [facility];  Service: Cardiovascular;  Laterality: N/A;    Prior to Admission medications   Medication Sig Start Date End Date Taking? Authorizing Provider  amiodarone (PACERONE) 200 MG tablet Take 200 mg by mouth daily. 10/24/19  Yes [provider]  apixaban (ELIQUIS) 5 MG TABS tablet Take 1 tablet (5 mg total) by mouth 2 (two) times daily. 10/07/19  Yes Croitoru, Mihai, MD  carboxymethylcellulose (REFRESH PLUS) 0.5 % SOLN Place 1 drop into both eyes 3 (three) times daily as needed (dry/irritated eyes.).    Yes [provider]  Cholecalciferol (VITAMIN D) 50 MCG (2000 UT) CAPS Take 1 capsule by mouth daily.   Yes [provider]  diphenoxylate-atropine (LOMOTIL) 2.5-0.025 MG tablet TAKE 1 TABLET EVERY 8 HOURS AS NEEDED FOR DIARRHEA Patient taking differently: Take 1 tablet by mouth every 8 (eight) hours as needed for diarrhea or loose stools.  03/25/19  Yes Gottschalk, Leatrice Jewels M, DO  hydrALAZINE (APRESOLINE) 10 MG tablet TAKE 1 TABLET EVERY 8 HOURS Patient taking differently: Take 10 mg by mouth every 8 (eight) hours.  10/09/19  Yes Croitoru, Mihai, MD  isosorbide mononitrate (IMDUR) 30 MG 24 hr tablet TAKE ONE HALF (1/2) TABLET DAILY Patient taking differently: Take 15 mg by mouth daily.  09/23/19  Yes Croitoru, Mihai, MD  levothyroxine (SYNTHROID) 75 MCG tablet Take 1 tablet (75 mcg total) by mouth daily before breakfast. 09/01/19  Yes Gottschalk, Ashly M, DO  loratadine (CLARITIN) 10 MG tablet Take 1 tablet (10 mg total) by mouth daily. (for itching) Patient taking differently: Take 10 mg by mouth as needed for allergies or itching.  01/20/19  Yes Gottschalk, Leatrice Jewels M, DO  meclizine (ANTIVERT) 25 MG tablet Take 25 mg by mouth 3 (three) times daily as needed for dizziness or nausea.  07/22/19  Yes [provider]  metoprolol tartrate (LOPRESSOR) 25 MG tablet TAKE  (1)  TABLET TWICE A DAY. Patient taking differently: Take 25 mg by mouth daily as needed (Pressure in chest).  09/23/19  Yes Croitoru, Mihai, MD  Multiple Vitamins-Minerals (CENTRUM SILVER ULTRA WOMENS PO) Take 1 tablet by mouth daily.   Yes [provider]  nitroGLYCERIN (NITROSTAT) 0.4 MG SL tablet Place 1 tablet (0.4 mg total) under the tongue every 5 (five) minutes as needed for chest pain. 12/20/18  Yes Claretta Fraise, MD  pantoprazole (PROTONIX) 40 MG tablet Take 1 tablet (40 mg total) by mouth every evening. Patient taking differently: Take 40 mg by mouth daily.  02/21/19  Yes Gottschalk, Ashly M, DO  PARoxetine  (PAXIL) 20 MG tablet TAKE (1) TABLET DAILY IN THE MORNING. Patient taking differently: Take 20 mg by mouth daily.  05/19/19  Yes Gottschalk, Ashly M, DO  rosuvastatin (CRESTOR) 40 MG tablet TAKE (1/2) TABLET DAILY. Patient taking differently: Take 20 mg by mouth daily.  10/06/19  Yes Gottschalk, Leatrice Jewels M, DO  torsemide (DEMADEX) 20 MG tablet Take 1 tablet (20 mg total) by mouth daily as needed (edema). 04/01/19  Yes  Ronnie Doss M, DO  amiodarone (PACERONE) 100 MG tablet Take 1 tablet (100 mg total) by mouth daily. Patient not taking: Reported on 11/15/2019 09/30/19   Croitoru, Mihai, MD  Vitamin D, Ergocalciferol, (DRISDOL) 1.25 MG (50000 UNIT) CAPS capsule TAKE 1 CAPSULE ONCE EVERY 7 DAYS Patient not taking: Reported on 11/15/2019 07/30/19   [name], DO    Current Facility-Administered Medications  Medication Dose Route Frequency Provider Last Rate Last Admin  . 0.9 %  sodium chloride infusion   Intravenous Continuous Reubin Milan, MD 75 mL/hr at 11/17/19 1251 New Bag at 11/17/19 1251  . acetaminophen (TYLENOL) tablet 650 mg  650 mg Oral Q6H PRN Kathie Dike, MD   650 mg at 11/17/19 0826  . amiodarone (PACERONE) tablet 200 mg  200 mg Oral Daily Kathie Dike, MD   200 mg at 11/17/19 0814  . apixaban (ELIQUIS) tablet 5 mg  5 mg Oral BID Kathie Dike, MD   5 mg at 11/17/19 0814  . dicyclomine (BENTYL) capsule 10 mg  10 mg Oral TID AC Memon, Jolaine Artist, MD      . hydrALAZINE (APRESOLINE) tablet 10 mg  10 mg Oral Q8H Kathie Dike, MD   10 mg at 11/17/19 8657  . hydrALAZINE (APRESOLINE) tablet 25 mg  25 mg Oral Q6H PRN Reubin Milan, MD   25 mg at 11/15/19 0549  . hydrocortisone (ANUSOL-HC) suppository 25 mg  25 mg Rectal BID Kathie Dike, MD   25 mg at 11/17/19 0814  . isosorbide mononitrate (IMDUR) 24 hr tablet 15 mg  15 mg Oral Daily Kathie Dike, MD   15 mg at 11/17/19 0815  . levothyroxine (SYNTHROID) tablet 75 mcg  75 mcg Oral QAC breakfast Kathie Dike, MD   75 mcg at 11/17/19 0814  . LORazepam (ATIVAN) tablet 0.25 mg  0.25 mg Oral TID PRN Kathie Dike, MD   0.25 mg at 11/16/19 1624  . meclizine (ANTIVERT) tablet 25 mg  25 mg Oral TID Kathie Dike, MD   25 mg at 11/17/19 0814  . pantoprazole (PROTONIX) EC tablet 40 mg  40 mg Oral Daily Kathie Dike, MD   40 mg at 11/17/19 0814  . PARoxetine (PAXIL) tablet 20 mg  20 mg Oral Daily Kathie Dike, MD   20 mg at 11/17/19 0815  . polyethylene glycol (MIRALAX / GLYCOLAX) packet 17 g  17 g Oral Daily PRN Kathie Dike, MD      . prochlorperazine (COMPAZINE) injection 5 mg  5 mg Intravenous Q6H PRN Reubin Milan, MD      . rosuvastatin (CRESTOR) tablet 20 mg  20 mg Oral Daily Kathie Dike, MD   20 mg at 11/17/19 0815    Allergies as of 11/14/2019 - Review Complete 11/14/2019  Allergen Reaction Noted  . Sotalol Other (See Comments) 11/01/2016  . Bactrim [sulfamethoxazole-trimethoprim]  08/19/2016  . Ciprofloxacin Nausea Only 02/05/2018  . Actos [pioglitazone] Swelling 07/31/2012  . Adhesive [tape] Other (See Comments) 02/20/2014  . Contrast media [iodinated diagnostic agents] Other (See Comments) 07/25/2011  . Latex Rash 05/29/2013  . Lipitor [atorvastatin] Other (See Comments) 07/31/2012    Family History  Problem Relation Age of Onset  . Stroke Mother   . Other Mother        brain tumor  . Other Father        MI  . Heart attack Father   . Stroke Sister   . Macular degeneration Sister   . Diabetes Daughter   .  Cancer Daughter        ovarian  . Colon polyps Daughter   . Atrial fibrillation Sister   . Hyperlipidemia Sister   . Osteoporosis Sister   . Stroke Sister   . Uterine cancer Sister   . Heart attack Brother   . Heart disease Brother   . Asthma Brother   . Breast cancer Neg Hx     Social History   Socioeconomic History  . Marital status: Widowed    Spouse name: Not on file  . Number of children: 4  . Years of education: 8  . Highest  education level: 8th grade  Occupational History  . Occupation: Retired  Tobacco Use  . Smoking status: Never Smoker  . Smokeless tobacco: Never Used  Vaping Use  . Vaping Use: Never used  Substance and Sexual Activity  . Alcohol use: No  . Drug use: No  . Sexual activity: Never    Birth control/protection: Post-menopausal  Other Topics Concern  . Not on file  Social History Narrative  . Not on file   Social Determinants of Health   Financial Resource Strain: Low Risk   . Difficulty of Paying Living Expenses: Not hard at all  Food Insecurity: No Food Insecurity  . Worried About Charity fundraiser in the Last Year: Never true  . Ran Out of Food in the Last Year: Never true  Transportation Needs: No Transportation Needs  . Lack of Transportation (Medical): No  . Lack of Transportation (Non-Medical): No  Physical Activity: Inactive  . Days of Exercise per Week: 0 days  . Minutes of Exercise per Session: 0 min  Stress: No Stress Concern Present  . Feeling of Stress : Only a little  Social Connections: Moderately Integrated  . Frequency of Communication with Friends and Family: More than three times a week  . Frequency of Social Gatherings with Friends and Family: Once a week  . Attends Religious Services: More than 4 times per year  . Active Member of Clubs or Organizations: Yes  . Attends Archivist Meetings: More than 4 times per year  . Marital Status: Widowed  Intimate Partner Violence: Not At Risk  . Fear of Current or Ex-Partner: No  . Emotionally Abused: No  . Physically Abused: No  . Sexually Abused: No    Review of Systems: Gen: see HPI CV: Denies chest pain, heart palpitations, syncope, edema  Resp: Denies shortness of breath with rest, cough, wheezing GI: see HPI GU : Denies urinary burning, urinary frequency, urinary incontinence.  MS: Denies joint pain,swelling, cramping Derm: Denies rash, itching, dry skin Psych: Denies depression,  anxiety,confusion, or memory loss Heme: Denies bruising, bleeding, and enlarged lymph nodes.  Physical Exam: Vital signs in last 24 hours: Temp:  [98 F (36.7 C)-98.6 F (37 C)] 98.6 F (37 C) (11/01 0541) Pulse Rate:  [60-72] 72 (11/01 0541) Resp:  [18] 18 (11/01 0541) BP: (140-166)/(62-84) 140/62 (11/01 0541) SpO2:  [97 %] 97 % (11/01 0541) Last BM Date: 11/17/19 General:   Alert,  Well-developed, well-nourished, pleasant and cooperative in NAD Head:  Normocephalic and atraumatic. Eyes:  Sclera clear, no icterus.   Conjunctiva pink. Ears:  Normal auditory acuity. Nose:  No deformity, discharge,  or lesions. Mouth:  No deformity or lesions, dentition normal. Lungs:  Clear throughout to auscultation.    Heart:  S1 S2 present Abdomen:  Soft, mild TTP upper abdomen and LLQ,  No masses, hepatosplenomegaly . Normal bowel sounds, without  guarding, and without rebound.   Rectal:  Deferred  Msk:  Symmetrical without gross deformities. Normal posture. Extremities:  Without edema. Neurologic:  Alert and  oriented x4 Psych:  Alert and cooperative. Normal mood and affect.  Intake/Output from previous day: 10/31 0701 - 11/01 0700 In: -  Out: 200 [Urine:200] Intake/Output this shift: No intake/output data recorded.  Lab Results: Recent Labs    11/14/19 2156 11/15/19 0422 11/17/19 0421  WBC 5.5 6.2 4.8  HGB 12.8 12.4 11.3*  HCT 39.7 38.2 34.6*  PLT 249 242 203   BMET Recent Labs    11/15/19 0422 11/16/19 0605 11/17/19 0421  NA 139 135 138  K 3.9 4.3 3.7  CL 98 102 109  CO2 28 25 21*  GLUCOSE 115* 107* 90  BUN _0 CREATININE 1.35* 1.47* 1.20*  CALCIUM 10.2 8.6* 8.6*   Lab Results  Component Value Date   ALT 27 11/17/2019   AST 33 11/17/2019   ALKPHOS 56 11/17/2019   BILITOT 0.9 11/17/2019    LFT Recent Labs    11/14/19 2156 11/15/19 0422  PROT 7.1 7.3  ALBUMIN 3.9 4.0  AST 41 38  ALT 33 31  ALKPHOS 72 67  BILITOT 0.7 0.9   PT/INR Recent Labs     11/14/19 2156  LABPROT 15.3*  INR 1.3*    Impression: Very pleasant 86 year old female admitted with abdominal pain, initially CT renal stone study completed and concern for early appendicitis; however, Surgery felt not dealing with appendicitis. Some improvement with Linzess, resolving constipation and now taking Miralax as needed. Bentyl was also started with mild improvement in symptoms although persisting.  With constellation of symptoms including early satiety, documented weight loss, postprandial abdominal pain, need to assess for chronic mesenteric ischemia. Best would be to perform CTA, which we will order for tomorrow pending continued improvement in renal function. If needed, could pursue mesenteric duplex study. Doubt biliary etiology; although she had mildly elevated transaminases transiently a few weeks ago, LFTs are remaining normal. Some question of stones/sludge on CT. May ultimately need EGD but could be done as outpatient with primary GI. Last in 2018 normal.   Plan: CTA tomorrow pending continued renal improvement Continue Protonix daily If CTA unrevealing, follow-up with [facility] in [facility] for EGD consideration Will continue to follow with you   Annitta Needs, PhD, ANP-BC [facility]    Outpatient egd   LOS: 2 days    11/17/2019, 3:34 PM

## 2019-11-17 NOTE — TOC Initial Note (Signed)
Transition of Care [facility]) - Initial/Assessment Note    Patient Details  Name: [name] MRN: [id number] Date of Birth: [birth year]  Transition of Care [facility]) CM/SW Contact:    Salome Arnt, [place] Phone Number: 11/17/2019, 1:52 PM  Clinical Narrative: Pt admitted due to abdominal pain. LCSW completed assessment due to high risk readmission score and recommendation from PT for home health. LCSW spoke with pt's son, Liliane Channel, who reports that pt will come stay with him after d/c. He indicates he will be close by all the time, although not always inside. Discussed PT recommendation and Liliane Channel is agreeable to home health PT referral being made to [facility]. Referral made to Tim with [facility] who accepts. Liliane Channel is aware of recommendation for shower chair by PT, but states this is not needed as pt takes sink baths. LCSW made hospital follow up appointment which is on AVS. Will need home health orders prior to d/c. TOC to continue to follow.                  Expected Discharge Plan: [place] Barriers to Discharge: Continued Medical Work up   Patient Goals and CMS Choice Patient states their goals for this hospitalization and ongoing recovery are:: return home.   Choice offered to / list presented to : Adult Children  Expected Discharge Plan and Services Expected Discharge Plan: [place] In-house Referral: Clinical Social Work Discharge Planning Services: Follow-up appt scheduled Post Acute Care Choice: Home Health Living arrangements for the past 2 months: [place]                   DME Agency: NA       HH Arranged: PT [place] Agency: [place] Date [facility] Agency Contacted: 11/17/19 Time HH Agency Contacted: [place] Representative spoke with at [place]: Tim  Prior Living Arrangements/Services Living arrangements for the past 2 months: [place] with:: Self Patient language and need for interpreter reviewed::  Yes Do you feel safe going back to the place where you live?: Yes      Need for Family Participation in Patient Care: Yes (Comment) Care giver support system in place?: Yes (comment) Current home services: DME (cane, walker, potty chair) Criminal Activity/Legal Involvement Pertinent to Current Situation/Hospitalization: No - Comment as needed  Activities of Daily Living Home Assistive Devices/Equipment: Cane (specify quad or straight), Walker (specify type) ADL Screening (condition at time of admission) Patient's cognitive ability adequate to safely complete daily activities?: Yes Is the patient deaf or have difficulty hearing?: No Does the patient have difficulty seeing, even when wearing glasses/contacts?: No Does the patient have difficulty concentrating, remembering, or making decisions?: No Patient able to express need for assistance with ADLs?: Yes Does the patient have difficulty dressing or bathing?: No Independently performs ADLs?: Yes (appropriate for developmental age) Does the patient have difficulty walking or climbing stairs?: No Weakness of Legs: Both Weakness of Arms/Hands: None  Permission Sought/Granted                  Emotional Assessment         Alcohol / Substance Use: Not Applicable Psych Involvement: No (comment)  Admission diagnosis:  Weakness [R53.1] Generalized abdominal pain [R10.84] Atypical chest pain [R07.89] Hypoxia [R09.02] Abnormal CT of the abdomen [R93.5] Abdominal pain [R10.9] Vertigo [R42] Patient Active Problem List   Diagnosis Date Noted  . Vertigo 11/15/2019  . Advanced care planning/counseling discussion  11/15/2019  . CAD (coronary artery disease)   . Hypothyroidism   . Hyponatremia   . Volume depletion   . TSH elevation 08/01/2019  . Hospital discharge follow-up 08/01/2019  . Exudative age-related macular degeneration of right eye with active choroidal neovascularization ([place]) 04/30/2019  . Retinal hemorrhage of right  eye 04/30/2019  . Advanced nonexudative age-related macular degeneration of left eye with subfoveal involvement 04/30/2019  . Exudative age-related macular degeneration of left eye with inactive choroidal neovascularization ([place]) 04/30/2019  . Advanced nonexudative age-related macular degeneration of right eye without subfoveal involvement 04/30/2019  . Posterior vitreous detachment of both eyes 04/30/2019  . Secondary hypercoagulable state ([place]) 03/11/2019  . Persistent atrial fibrillation ([place])   . Proctitis 01/09/2019  . Atrial fibrillation, chronic ([place]) 01/09/2019  . Hyperlipidemia 01/09/2019  . Nausea and vomiting 01/09/2019  . Hyperglycemia 01/09/2019  . COVID-19 virus infection 12/28/2018  . Chronic diarrhea 09/18/2018  . Abdominal pain 09/18/2018  . Osteoarthritis of right knee 03/29/2018  . GERD (gastroesophageal reflux disease)   . Diarrhea of presumed infectious origin 02/06/2018  . Essential hypertension 12/21/2017  . Degenerative scoliosis 02/19/2017  . Burst fracture of lumbar vertebra ([place]) 02/19/2017  . History of drug-induced prolonged QT interval with torsade de pointes 11/01/2016  . Iron deficiency anemia 10/30/2016  . Long term current use of anticoagulant 02/17/2016  . Near syncope 05/22/2015  . Diabetes mellitus type 2, diet-controlled ([place]) 09/14/2014  . ICD (implantable cardioverter-defibrillator) battery depletion 01/20/2014  . Abnormal nuclear stress test, 12/30/13 12/31/2013  . Coronary artery disease involving native coronary artery of native heart without angina pectoris 12/31/2013  . Paroxysmal atrial fibrillation, chads2 Vasc2 score of 5, on eliquis 10/16/2013  . Catecholaminergic polymorphic ventricular tachycardia ([place]) 10/16/2013  . Ureteral stone with hydronephrosis 05/20/2013  . Metabolic syndrome 18/29/9371  . Orthostatic hypotension 07/27/2011  . Chest pain, r/o cardiac, negative MI 07/25/2011  . Weakness 07/25/2011    Class: Acute  .  Chronic sinus bradycardia 07/25/2011  . ICD (implantable cardioverter-defibrillator) in place 07/25/2011  . CKD (chronic kidney disease) stage 3, GFR 30-59 ml/min (HCC) 07/25/2011    Class: Chronic  . History of DVT in the past, on eliquis now 07/25/2011    Class: History of  . Nephrolithiasis, just saw Dr [name] 07/25/2011    Class: History of  . DYSPHAGIA 03/24/2010  . Chronic ischemic heart disease 12/03/2007  . Irritable bowel syndrome with diarrhea 12/03/2007  . EPIGASTRIC PAIN 12/03/2007  . Hyperlipidemia associated with type 2 diabetes mellitus ([place]) 12/02/2007    Class: History of  . EXTERNAL HEMORRHOIDS 12/02/2007    Class: History of  . ESOPHAGITIS 12/02/2007  . GASTRITIS 12/02/2007  . DIVERTICULOSIS, COLON 12/02/2007  . ARTHRITIS 12/02/2007    Class: Chronic   PCP:  [name], DO Pharmacy:   [place] [place] [address] Phone: [phone] Fax: [phone]     Social Determinants of Health (SDOH) Interventions    Readmission Risk Interventions Readmission Risk Prevention Plan 11/17/2019  Transportation Screening Complete  Medication Review ([place]) Complete  PCP or Specialist appointment within 3-5 days of discharge Complete  HRI or [place] Complete  SW Recovery Care/Counseling Consult Complete  [place] Not Applicable  Some recent data might be hidden

## 2019-11-18 ENCOUNTER — Inpatient Hospital Stay (HOSPITAL_COMMUNITY): Payer: Medicare Other

## 2019-11-18 DIAGNOSIS — Z7189 Other specified counseling: Secondary | ICD-10-CM | POA: Diagnosis not present

## 2019-11-18 DIAGNOSIS — R109 Unspecified abdominal pain: Secondary | ICD-10-CM | POA: Diagnosis not present

## 2019-11-18 DIAGNOSIS — R935 Abnormal findings on diagnostic imaging of other abdominal regions, including retroperitoneum: Secondary | ICD-10-CM

## 2019-11-18 DIAGNOSIS — E119 Type 2 diabetes mellitus without complications: Secondary | ICD-10-CM | POA: Diagnosis not present

## 2019-11-18 DIAGNOSIS — R1010 Upper abdominal pain, unspecified: Secondary | ICD-10-CM | POA: Diagnosis not present

## 2019-11-18 DIAGNOSIS — R1084 Generalized abdominal pain: Secondary | ICD-10-CM | POA: Diagnosis not present

## 2019-11-18 LAB — GLUCOSE, CAPILLARY
Glucose-Capillary: 155 mg/dL — ABNORMAL HIGH (ref 70–99)
Glucose-Capillary: 86 mg/dL (ref 70–99)
Glucose-Capillary: 95 mg/dL (ref 70–99)

## 2019-11-18 LAB — CBC
HCT: 35.2 % — ABNORMAL LOW (ref 36.0–46.0)
Hemoglobin: 11.3 g/dL — ABNORMAL LOW (ref 12.0–15.0)
MCH: 31.4 pg (ref 26.0–34.0)
MCHC: 32.1 g/dL (ref 30.0–36.0)
MCV: 97.8 fL (ref 80.0–100.0)
Platelets: 233 10*3/uL (ref 150–400)
RBC: 3.6 MIL/uL — ABNORMAL LOW (ref 3.87–5.11)
RDW: 13.6 % (ref 11.5–15.5)
WBC: 5.6 10*3/uL (ref 4.0–10.5)
nRBC: 0 % (ref 0.0–0.2)

## 2019-11-18 LAB — BASIC METABOLIC PANEL
Anion gap: 8 (ref 5–15)
BUN: 13 mg/dL (ref 8–23)
CO2: 22 mmol/L (ref 22–32)
Calcium: 8.6 mg/dL — ABNORMAL LOW (ref 8.9–10.3)
Chloride: 107 mmol/L (ref 98–111)
Creatinine, Ser: 1.11 mg/dL — ABNORMAL HIGH (ref 0.44–1.00)
GFR, Estimated: 48 mL/min — ABNORMAL LOW (ref >60)
Glucose, Bld: 110 mg/dL — ABNORMAL HIGH (ref 70–99)
Potassium: 3 mmol/L — ABNORMAL LOW (ref 3.5–5.1)
Sodium: 137 mmol/L (ref 135–145)

## 2019-11-18 LAB — OCCULT BLOOD X 1 CARD TO LAB, STOOL: Fecal Occult Bld: NEGATIVE

## 2019-11-18 MED ORDER — IOHEXOL 350 MG/ML SOLN
100.0000 mL | Freq: Once | INTRAVENOUS | Status: AC | PRN
  Administered 2019-11-18: 80 mL via INTRAVENOUS

## 2019-11-18 MED ORDER — POTASSIUM CHLORIDE CRYS ER 20 MEQ PO TBCR
40.0000 meq | EXTENDED_RELEASE_TABLET | ORAL | Status: AC
  Administered 2019-11-18 – 2019-11-19 (×2): 40 meq via ORAL
  Filled 2019-11-18 (×2): qty 2

## 2019-11-18 NOTE — Progress Notes (Addendum)
Subjective: Continues with postprandial abdominal pain starting within 10 minutes of eating across mid abdomen. Can last up to 2 hours. Feels like an increased pressure/burning sensation. This started about 3 weeks ago. Had pancakes, a couple bites of oatmeal, banana, and boiled egg this morning. Continues with pain currently. About 7/10. Between episodes of abdominal pain she continues with mild pain. Overall abdominal pain is not quite as bad as it was when she presented to the emergency room. No nausea or vomiting.   Small BM this morning. Good BM yesterday. Reports dark but not completely black stool started at home started about 2 weeks ago and had been occurring daily. Also passing some red blood in her pamper as well. After eating, the pain makes her feel like she has to have a BM. Has some burning in her rectum. Using anusol suppositories which has helped.   No NSAIDs  Per nursing staff, she has not had any overt GI bleeding.   Objective: Vital signs in last 24 hours: Temp:  [97.7 F (36.5 C)-98.2 F (36.8 C)] 97.8 F (36.6 C) (11/02 0526) Pulse Rate:  [60-71] 71 (11/02 0526) Resp:  [17-18] 17 (11/02 0526) BP: (140-204)/(73-105) 144/73 (11/02 0526) SpO2:  [98 %-100 %] 98 % (11/02 0526) Last BM Date: 11/17/19 General:   Alert and oriented, pleasant, resting comfortably in recliner.  Head:  Normocephalic and atraumatic. Abdomen:  Bowel sounds present, soft, non-distended. Mild tenderness to palpation across the upper abdomen greatest in the LUQ. Also with slight TTP in the LLQ. No rebound or guarding. No masses appreciated  Msk:  Symmetrical without gross deformities. Normal posture. Extremities:  Without edema. Neurologic:  Alert and  oriented x4;  grossly normal neurologically Psych: Normal mood and affect.  Intake/Output from previous day: 11/01 0701 - 11/02 0700 In: 120 [P.O.:120] Out: -  Intake/Output this shift: No intake/output data recorded.  Lab  Results: Recent Labs    11/17/19 0421  WBC 4.8  HGB 11.3*  HCT 34.6*  PLT 203   BMET Recent Labs    11/16/19 0605 11/17/19 0421 11/18/19 0550  NA 135 138 137  K 4.3 3.7 3.0*  CL 102 109 107  CO2 25 21* 22  GLUCOSE 107* 90 110*  BUN 19 13 13   CREATININE 1.47* 1.20* 1.11*  CALCIUM 8.6* 8.6* 8.6*   LFT Recent Labs    11/17/19 0421  PROT 5.9*  ALBUMIN 3.2*  AST 33  ALT 27  ALKPHOS 56  BILITOT 0.9  BILIDIR 0.1  IBILI 0.8   Assessment: Very pleasant 86 year old female admitted with postprandial mid to upper abdominal pain, initially CT renal stone study completed and concern for early appendicitis; however, Surgery felt not dealing with appendicitis. Some improvement with Linzess, resolving constipation and now taking Miralax as needed. Bentyl was also started with mild improvement in symptoms although persisting.  She has a constellation of symptoms including post prandial abdominal pain, early satiety, and documented weight loss. Due to concerns for possible mesenteric ischemia, CTA was completed today with no evidence of chronic mesenteric ischemia, stable appearance of abdominal aorta with focal dilation in infrarenal abdominal aorta associated with a focal dissection or penetrating ulcer, critical stenosis involving left renal artery with left renal atrophy, Stable 8 mm low-density structure in the pancreatic head/neck region (probably small cystic lesion and could represent an IPMN), mild wall thickening in distal stomach.  Etiology of abdominal pain has not been identified. No mesenteric ischemia. She does report some dark stool  prior to admission along with blood in her pamper which she reports was coming from her rectum. No overt GI bleeding since admission. With gastric wall thickening, can't rule out PUD/H. pylori. Hemoglobin down to 11.3 yesterday from 12.4 on 10/30 which could be dilutional. Denies NSAIDs and is chronically on PPI daily which keeps GERD well  controlled. She would likely benefit from EGD. Last EGD in 2018.   It is less likely, but can not  rule out biliary etiology as there has been some question of stones/sludge on prior CT in July 2201 and mildly elevated transaminases transiently in June/July 2021 and again a few weeks ago (care everywhere). LFTs this admission have remained normal.   Plan: Repeat CBC Obtain IFOBT Continue Protonix 40 mg daily.  Will discuss with Dr. Jenetta Downer role of inpatient EGD. Notably, she is on Eliquis which would have to be held x48 hours if EGD is pursued.   Suspected IPMN will need outpatient follow-up with MRI/MRCP in 2 years.    LOS: 3 days    11/18/2019, 7:46 AM   Aliene Altes, [facility]  Gastroenterology attending attestation note: Agree with the below findings as written. The patient was presented to me by the APP (Advanced Practice Provider) I personally reviewed the medical chart and evaluated the patient. I personally evaluated and examined the patient by myself.  I agree with Mrs. Tivis Ringer Harper's H/P and assessment.  Patient has presented persistent abdominal pain, which is present constantly but worsens when patient eats.  The pain improves when patient lays on her back.  Denies having any nausea, vomiting, fever, chills.  Has presented some decreased appetite but no presence of , hematochezia, although she reports episodes of melena in the past.  Hemoglobin is slightly decreased from admission but above 11.  Of note, she has reported episodes of retrosternal chest discomfort when the abdominal pain worsens.  The patient underwent abdominal CTA the rule out presence of any major vascular abnormalities leading to chronic mesenteric ischemia, there was presence of questionable gastric wall thickening without any other major findings.  At this point, patient should be continued on Protonix 40 mg every day and we will schedule for EGD tomorrow to evaluate any upper  gastrointestinal source for her abdominal pain, will need to hold Eliquis for now. Will need to repeat cross-sectional abdominal imaging in 2 years for follow-up of stable IPMN's.  Maylon Peppers, MD Gastroenterology and Hepatology [facility]

## 2019-11-18 NOTE — Plan of Care (Signed)

## 2019-11-18 NOTE — Progress Notes (Signed)
Physical Therapy Treatment Patient Details Name: SOLIYANA MCCHRISTIAN MRN: [id number] DOB: 07/04/1931 Today's Date: 11/18/2019    History of Present Illness 86 y.o. female with PMH of afib, cardioversion, AICD placement, CAD, MI, chronic systolic heart failure with most recent EF measurement of 40 to 45%, near syncopal episode, orthostatic hypotension episodes, HTN, anxiety, depression, OA, cataracts, chronic back pain, stage III CKD, DVT, diabetes, macular degeneration with c/o weakness, dizziness, abdominal pain with decreased apetite, early satiety, blating and change in bowel over the past 2 weeks.    PT Comments    Pt requests to void bladder at beginning of session, demonstrates tremors with ambulation require min G to maneuver RW safely around obstacles into restroom. Pt tolerates additional ambulation into hallway with SUPV, limited by "faint" feeling. Pt able to ambulate back to room and requests to lay down with improvement in symptoms before therapist able to check vitals. Pt states she is seeing people in her room, pt appears concerned and not scared, states she knows she is safe and in the hospital but doesn't know why she is seeing people in the room, has difficulty explaining who/what she is seeing- RN notified. Patient will benefit from continued physical therapy in hospital and recommendations below to increase strength, balance, endurance for safe ADLs and gait.    Follow Up Recommendations  Home health PT;Supervision for mobility/OOB;Supervision - Intermittent     Equipment Recommendations  Other (comment) (shower chair that spans from in tub to out of tub for safer transfers)    Recommendations for Other Services       Precautions / Restrictions Precautions Precautions: Fall Restrictions Weight Bearing Restrictions: No    Mobility  Bed Mobility Overal bed mobility: Modified Independent Bed Mobility: Sit to Supine  Sit to supine: Modified independent (Device/Increase  time)   General bed mobility comments: able to return to supine without assist  Transfers Overall transfer level: Needs assistance Equipment used: Rolling walker (2 wheeled) Transfers: Sit to/from Stand Sit to Stand: Supervision    General transfer comment: cues to push from seated surface, increased tremors upon standing requiring standing rest before taking steps  Ambulation/Gait Ambulation/Gait assistance: Supervision;Min guard Gait Distance (Feet): 80 Feet Assistive device: Rolling walker (2 wheeled) Gait Pattern/deviations: Step-through pattern;Decreased stride length;Trunk flexed;Narrow base of support Gait velocity: decreased   General Gait Details: very narrow BOS with occasional scissor step, tremors noted throughout gait without improvement or worsening, min G with turns and tight spaces for RW management; limited by "faint" feeling   Stairs             Wheelchair Mobility    Modified Rankin (Stroke Patients Only)       Balance Overall balance assessment: Needs assistance Sitting-balance support: Feet supported;No upper extremity supported Sitting balance-Leahy Scale: Good Sitting balance - Comments: seated at EOB   Standing balance support: During functional activity;No upper extremity supported Standing balance-Leahy Scale: Poor Standing balance comment: reliant on UE support       Cognition Arousal/Alertness: Awake/alert Behavior During Therapy: WFL for tasks assessed/performed Overall Cognitive Status: Impaired/Different from baseline  General Comments: Pt states she thinks she is seeing people in her room, pt appears concerned, but not scared when trying to explain- pt comforted and RN notified      Exercises General Exercises - Lower Extremity Mini-Sqauts: AROM;Strengthening;10 reps (HHA on RW)    General Comments        Pertinent Vitals/Pain Pain Assessment: Faces Faces Pain Scale: Hurts even more Pain Location:  stomach Pain  Descriptors / Indicators: Aching;Guarding;Discomfort Pain Intervention(s): Limited activity within patient's tolerance;Monitored during session    Home Living                      Prior Function            PT Goals (current goals can now be found in the care plan section) Acute Rehab PT Goals Patient Stated Goal: return home with family to assist PT Goal Formulation: With patient/family Time For Goal Achievement: 11/30/19 Potential to Achieve Goals: Fair Progress towards PT goals: Progressing toward goals    Frequency    Min 3X/week      PT Plan Current plan remains appropriate    Co-evaluation              AM-PAC PT "6 Clicks" Mobility   Outcome Measure  Help needed turning from your back to your side while in a flat bed without using bedrails?: None Help needed moving from lying on your back to sitting on the side of a flat bed without using bedrails?: None Help needed moving to and from a bed to a chair (including a wheelchair)?: A Little Help needed standing up from a chair using your arms (e.g., wheelchair or bedside chair)?: A Little Help needed to walk in hospital room?: A Little Help needed climbing 3-5 steps with a railing? : A Little 6 Click Score: 20    End of Session Equipment Utilized During Treatment: Gait belt Activity Tolerance: Patient limited by fatigue Patient left: in bed;with call bell/phone within reach;with bed alarm set Nurse Communication: Mobility status;Other (comment) (seeing people in room) PT Visit Diagnosis: Unsteadiness on feet (R26.81);Other abnormalities of gait and mobility (R26.89);Muscle weakness (generalized) (M62.81)     Time: 9622-2979 PT Time Calculation (min) (ACUTE ONLY): 17 min  Charges:  $Gait Training: 8-22 mins                      Tori Kiffany Schelling PT, DPT 11/18/19, 1:46 PM [phone]

## 2019-11-18 NOTE — Progress Notes (Addendum)
PROGRESS NOTE    [name]  VOJ:[id number] DOB: Apr 18, 1931 DOA: 11/14/2019 PCP: [name], DO    Brief Narrative:  [name] is a 86 y.o. female with medical history significant of atrial fibrillation, history of cardioversion, AICD placement, history of CAD, history of MI, chronic systolic heart failure with most recent EF measurement of 40 to 45%,  history of near syncopal episode, orthostatic hypotension episodes, hypertension, anxiety, depression, osteoarthritis, cataracts, chronic back pain, stage III chronic kidney disease, history of DVT, diet controlled type 2 diabetes, diverticulosis, history of esophageal stricture, history of esophagitis, GERD, hiatal hernia, history of rectal bleeding, hyperlipidemia, macular degeneration, nephrolithiasis who is brought to the emergency department via EMS due to weakness and dizziness for the past 2 weeks.  The patient thinks that she may have had some chest pain, but does not have any symptomatology anymore.  She denies pain radiation, diaphoresis, palpitations, orthopnea or pitting edema of the lower extremities.  The patient also complains that she has been having abdominal pain with decreased appetite, early satiety, bloating sensation and change in bowel habits for the past 2 weeks.  She denies nausea, vomiting, diarrhea, melena or hematochezia.  No dysuria, frequency or hematuria.  She denies trauma sore throat, rhinorrhea, productive cough, dyspnea, wheezing or hemoptysis.   Assessment & Plan:   Principal Problem:   Abdominal pain Active Problems:   History of DVT in the past, on eliquis now   Paroxysmal atrial fibrillation, chads2 Vasc2 score of 5, on eliquis   Diabetes mellitus type 2, diet-controlled (HCC)   Essential hypertension   GERD (gastroesophageal reflux disease)   Hyperlipidemia   CAD (coronary artery disease)   Hypothyroidism   Hyponatremia   Volume depletion   Vertigo   Advanced care  planning/counseling discussion   Abnormal CT of the abdomen   Abdominal pain -CT abdomen indicated questionable appendicitis -Seen by general surgery this was felt to be unlikely -She does not have any fever or leukocytosis -She did describe having periods of diarrhea, but in the past 2 weeks was on the more constipated side -She was started on MiraLAX and Linzess with multiple bowel movements -She continues to have abdominal pain after eating and drinking -Since she is having frequent stools, MiraLAX and Linzess have been discontinued -She has been started on Bentyl for possible IBS component -GI consulted and patient had CTA abdomen to evaluate for mesenteric ischemia -CTA showed patent mesenteric vasculature -plans are for EGD on 11/3   Vertigo -She had similar episodes in the past, but symptoms appear to be worse over the past few weeks -She does have declined in systolic blood pressure from 170s to 140s on standing -MRI imaging not an option due to defibrillator -Started on meclizine and as needed benzos -Overall dizziness has improved   Hyponatremia -Suspect related to decreased p.o. intake -Improved with hydration   Paroxysmal atrial fibrillation -Eliquis currently on hold for EGD -Followed at the [facility] -Started on amiodarone recently   Diabetes, diet controlled -Monitor blood sugars   Hypertension -Resume home doses of hydralazine -Blood pressure currently stable -Use IV hydralazine as needed for high blood pressures   Hypothyroidism. -Resume home dose of Synthroid   Coronary artery disease -No complaints of chest pain -Continue statin, Imdur  Chronic kidney disease stage IIIb -Creatinine is currently at baseline.  Continue to monitor   Advance care planning -Discussed CODE STATUS with patient -She agrees to DNR -[name] was present for the conversation  Generalized weakness -Seen by physical therapy with recommendations for home  health physical therapy  Abnormal CTA abd -noted to have focal dilatation in the infrarenal abdominal aorta with a focal dissection or penetrating ulcer -CT reviewed with vascular surgery Dr. Scot Dock -Recommendations were that CT findings indicate a very small area which is chronic and unimpressive -considering advanced age and unimpressive findings on imaging, no further work up or intervention was recommended at this time.  DVT prophylaxis: scd  Code Status: DNR Family Communication: Discussed with daughter-in-law at the bedside Disposition Plan: Status is: Inpatient  Remains inpatient appropriate because:Ongoing diagnostic testing needed not appropriate for outpatient work up and Unsafe d/c plan   Dispo: The patient is from: Home              Anticipated d/c is to: Home              Anticipated d/c date is: 2 days              Patient currently is not medically stable to d/c.    Consultants:  GI  Procedures:    Antimicrobials:      Subjective: Continues to have abdominal pain which is worse after eating. She has associated nausea. Po intake remains poor. Dizziness is better  Objective: Vitals:   11/17/19 2113 11/18/19 0526 11/18/19 0800 11/18/19 1324  BP: (!) 171/82 (!) 144/73 (!) 176/80 (!) 160/89  Pulse: 62 71 (!) 112 64  Resp: 17 17 18 20   Temp: 98.2 F (36.8 C) 97.8 F (36.6 C) 97.7 F (36.5 C) 97.6 F (36.4 C)  TempSrc: Oral Oral Oral Oral  SpO2: 99% 98% 99% 100%  Weight:      Height:        Intake/Output Summary (Last 24 hours) at 11/18/2019 1936 Last data filed at 11/18/2019 1700 Gross per 24 hour  Intake 360 ml  Output --  Net 360 ml   Filed Weights   11/14/19 2124 11/15/19 0920  Weight: 63.1 kg 63.2 kg    Examination:  General exam: Alert, awake, oriented x 3 Respiratory system: Clear to auscultation. Respiratory effort normal. Cardiovascular system:RRR. No murmurs, rubs, gallops. Gastrointestinal system: Abdomen is nondistended, soft  and tender on left side. No organomegaly or masses felt. Normal bowel sounds heard. Central nervous system: Alert and oriented. No focal neurological deficits. Extremities: No C/C/E, +pedal pulses Skin: No rashes, lesions or ulcers Psychiatry: Judgement and insight appear normal. Mood & affect appropriate.      Data Reviewed: I have personally reviewed following labs and imaging studies  CBC: Recent Labs  Lab 11/14/19 2156 11/15/19 0422 11/17/19 0421 11/18/19 0550  WBC 5.5 6.2 4.8 5.6  NEUTROABS 3.4 3.1  --   --   HGB 12.8 12.4 11.3* 11.3*  HCT 39.7 38.2 34.6* 35.2*  MCV 96.8 96.2 98.3 97.8  PLT 249 242 203 920   Basic Metabolic Panel: Recent Labs  Lab 11/14/19 2156 11/15/19 0422 11/16/19 0605 11/17/19 0421 11/18/19 0550  NA 133* 139 135 138 137  K 3.8 3.9 4.3 3.7 3.0*  CL 97* 98 102 109 107  CO2 26 28 25  21* 22  GLUCOSE 121* 115* 107* 90 110*  BUN 23 21 19 13 13   CREATININE 1.55* 1.35* 1.47* 1.20* 1.11*  CALCIUM 9.4 10.2 8.6* 8.6* 8.6*  MG 2.2  --   --   --   --    GFR: Estimated Creatinine Clearance: 32.6 mL/min (A) (by C-G formula based on SCr of  1.11 mg/dL (H)). Liver Function Tests: Recent Labs  Lab 11/14/19 2156 11/15/19 0422 11/17/19 0421  AST 41 38 33  ALT 33 31 27  ALKPHOS 72 67 56  BILITOT 0.7 0.9 0.9  PROT 7.1 7.3 5.9*  ALBUMIN 3.9 4.0 3.2*   No results for input(s): LIPASE, AMYLASE in the last 168 hours. No results for input(s): AMMONIA in the last 168 hours. Coagulation Profile: Recent Labs  Lab 11/14/19 2156  INR 1.3*   Cardiac Enzymes: No results for input(s): CKTOTAL, CKMB, CKMBINDEX, TROPONINI in the last 168 hours. BNP (last 3 results) No results for input(s): PROBNP in the last 8760 hours. HbA1C: No results for input(s): HGBA1C in the last 72 hours. CBG: Recent Labs  Lab 11/17/19 0539 11/17/19 1130 11/17/19 2359 11/18/19 0753 11/18/19 1117  GLUCAP 85 171* 95 86 155*   Lipid Profile: No results for input(s): CHOL,  HDL, LDLCALC, TRIG, CHOLHDL, LDLDIRECT in the last 72 hours. Thyroid Function Tests: No results for input(s): TSH, T4TOTAL, FREET4, T3FREE, THYROIDAB in the last 72 hours. Anemia Panel: No results for input(s): VITAMINB12, FOLATE, FERRITIN, TIBC, IRON, RETICCTPCT in the last 72 hours. Sepsis Labs: No results for input(s): PROCALCITON, LATICACIDVEN in the last 168 hours.  Recent Results (from the past 240 hour(s))  Respiratory Panel by RT PCR (Flu A&B, Covid) - Nasopharyngeal Swab     Status: None   Collection Time: 11/15/19  3:13 AM   Specimen: Nasopharyngeal Swab  Result Value Ref Range Status   SARS Coronavirus 2 by RT PCR NEGATIVE NEGATIVE Final    Comment: (NOTE) SARS-CoV-2 target nucleic acids are NOT DETECTED.  The SARS-CoV-2 RNA is generally detectable in upper respiratoy specimens during the acute phase of infection. The lowest concentration of SARS-CoV-2 viral copies this assay can detect is 131 copies/mL. A negative result does not preclude SARS-Cov-2 infection and should not be used as the sole basis for treatment or other patient management decisions. A negative result may occur with  improper specimen collection/handling, submission of specimen other than nasopharyngeal swab, presence of viral mutation(s) within the areas targeted by this assay, and inadequate number of viral copies (<131 copies/mL). A negative result must be combined with clinical observations, patient history, and epidemiological information. The expected result is Negative.  Fact Sheet for Patients:  PinkCheek.be  Fact Sheet for Healthcare Providers:  GravelBags.it  This test is no t yet approved or cleared by the [place] FDA and  has been authorized for detection and/or diagnosis of SARS-CoV-2 by FDA under an Emergency Use Authorization (EUA). This EUA will remain  in effect (meaning this test can be used) for the duration of  the COVID-19 declaration under Section 564(b)(1) of the Act, 21 U.S.C. section 360bbb-3(b)(1), unless the authorization is terminated or revoked sooner.     Influenza A by PCR NEGATIVE NEGATIVE Final   Influenza B by PCR NEGATIVE NEGATIVE Final    Comment: (NOTE) The Xpert Xpress SARS-CoV-2/FLU/RSV assay is intended as an aid in  the diagnosis of influenza from Nasopharyngeal swab specimens and  should not be used as a sole basis for treatment. Nasal washings and  aspirates are unacceptable for Xpert Xpress SARS-CoV-2/FLU/RSV  testing.  Fact Sheet for Patients: PinkCheek.be  Fact Sheet for Healthcare Providers: GravelBags.it  This test is not yet approved or cleared by the [place] FDA and  has been authorized for detection and/or diagnosis of SARS-CoV-2 by  FDA under an Emergency Use Authorization (EUA). This EUA will remain  in  effect (meaning this test can be used) for the duration of the  Covid-19 declaration under Section 564(b)(1) of the Act, 21  U.S.C. section 360bbb-3(b)(1), unless the authorization is  terminated or revoked. Performed at [facility], [address] [place], [place] [address]          Radiology Studies: CT Angio Abd/Pel w/ and/or w/o  Result Date: 11/18/2019 CLINICAL DATA:  Abdominal pain with decreased appetite. Evaluate for chronic mesenteric ischemia. EXAM: CTA ABDOMEN AND PELVIS  WITH CONTRAST TECHNIQUE: Multidetector CT imaging of the abdomen and pelvis was performed using the standard protocol during bolus administration of intravenous contrast. Multiplanar reconstructed images and MIPs were obtained and reviewed to evaluate the vascular anatomy. CONTRAST:  48mL OMNIPAQUE IOHEXOL 350 MG/ML SOLN COMPARISON:  CT renal stone protocol 11/15/2019 and 01/11/2019 FINDINGS: VASCULAR Aorta: Diffuse atherosclerotic disease involving the abdominal aorta. Abdominal aorta is tortuous and related to  the levoscoliosis. Again noted is a focal dilatation in the infrarenal aorta just beyond the takeoff of the IMA. This focal dilatation measures up to 2.4 cm and not significantly changed since 03/13/2018. In addition, there is an intimal flap along the left side of the abdominal aorta at this area of dilatation. This is unchanged from the exam in 2020 and could represent a focal dissection versus penetrating ulcer. No significant aortic stenosis. Celiac: Patent without evidence of aneurysm, dissection, vasculitis or significant stenosis. SMA: Mild narrowing at the origin of the SMA. The stenosis does not appear to be hemodynamically significant. Main SMA branches are widely patent. No evidence for aneurysm or dissection. Renals: Calcified plaque involving the origin of the left renal artery and findings are suggestive for high-grade stenosis and near occlusion of the left renal artery. Right renal artery is widely patent at the origin but has a beaded appearance, best seen on the coronal reformats, sequence 7, image 87. Findings are compatible with fibromuscular dysplasia and there was a similar finding on the previous examination. IMA: Patent Inflow: Bilateral common and external iliac arteries are widely patent without significant stenosis. Bilateral internal iliac arteries are patent with narrowing involving the left internal iliac artery. Proximal Outflow: Proximal femoral arteries are patent bilaterally. Veins: Portal venous system is patent. No gross abnormality to the IVC or iliac veins. Hepatic veins are patent. Review of the MIP images confirms the above findings. NON-VASCULAR Lower chest: Defibrillator leads extending into the right heart. Peripheral 5 mm density in the right lower lobe on sequence 11, image 17 has minimally changed since 2013 and suggestive for a benign nodule. Motion artifact at the lung bases. No significant pleural effusions. Hepatobiliary: Normal appearance of the liver, gallbladder  and portal venous system. No biliary dilatation Pancreas: Round low-density structure at the pancreatic neck region measures up to 8 mm on sequence 4, image 66. This is suggestive for small cystic lesion and unchanged since 2020. No significant pancreatic dilatation. No pancreatic inflammation or stranding. Spleen: Normal in size without focal abnormality. Adrenals/Urinary Tract: Adrenal glands are within normal limits. Left kidney is atrophy and likely associated with the high-grade left renal artery stenosis. Large cortical calcification involving the left kidney lower pole. Scattered calcifications in both kidneys. 7 mm stone in the right kidney lower pole. 8 mm stone in the right kidney interpolar region. Small right renal cysts. No suspicious renal lesions. No hydronephrosis. Urinary bladder is unremarkable. Stomach/Bowel: Mild wall thickening in the distal stomach is nonspecific. No inflammatory changes around the duodenum or stomach. Fluid-filled colon. No focal bowel  inflammation and no evidence for bowel obstruction. Appendix contains fluid but no surrounding inflammatory changes. No significant small bowel dilatation. Lymphatic: No abdominopelvic lymphadenopathy. Reproductive: Uterus and bilateral adnexa are unremarkable. Other: Negative for ascites.  Negative for free air. Musculoskeletal: Again noted are multiple cardiac leads in the anterior abdominal subcutaneous tissues. Levoscoliosis of the lumbar spine. Stable old compression deformity involving the T12 vertebral body. Multilevel degenerative changes in the lumbar spine. IMPRESSION: VASCULAR 1. No evidence for chronic mesenteric ischemia. The main mesenteric arteries are patent without significant stenosis. 2. Stable appearance of the abdominal aorta with focal dilatation in the infrarenal abdominal aorta associated with a focal dissection or penetrating ulcer. 3. Fibromuscular dysplasia involving the right renal artery. 4. Critical stenosis  involving the left renal artery with associated left renal atrophy. NON-VASCULAR 1. No acute abnormality abdomen or pelvis. 2. Stable 8 mm low-density structure in the pancreatic head/neck region. This probably represents a small cystic lesion and could represent an IPMN. Recommend follow up pre and post contrast MRI/MRCP or pancreatic protocol CT in 2 years. This recommendation follows ACR consensus guidelines: Management of Incidental Pancreatic Cysts: A White Paper of the ACR Incidental Findings Committee. J Am Coll Radiol 5009;38:182-993. 3. Bilateral renal calculi.  No hydronephrosis. Electronically Signed   By: Markus Daft M.D.   On: 11/18/2019 10:46        Scheduled Meds:  amiodarone  200 mg Oral Daily   dicyclomine  10 mg Oral TID AC   hydrALAZINE  10 mg Oral Q8H   hydrocortisone  25 mg Rectal BID   isosorbide mononitrate  15 mg Oral Daily   levothyroxine  75 mcg Oral QAC breakfast   meclizine  25 mg Oral TID   pantoprazole  40 mg Oral Daily   PARoxetine  20 mg Oral Daily   rosuvastatin  20 mg Oral Daily   Continuous Infusions:     LOS: 3 days    Time spent: 55mins    Kathie Dike, MD [facility]   If 7PM-7AM, please contact night-coverage [email or web address]  11/18/2019, 7:36 PM

## 2019-11-19 ENCOUNTER — Encounter (HOSPITAL_COMMUNITY)
Admission: EM | Disposition: A | Discharge status: Home / Self Care | Payer: Self-pay | Source: Home / Self Care | Attending: Internal Medicine

## 2019-11-19 ENCOUNTER — Encounter (HOSPITAL_COMMUNITY): Payer: Self-pay | Admitting: Anesthesiology

## 2019-11-19 ENCOUNTER — Encounter (INDEPENDENT_AMBULATORY_CARE_PROVIDER_SITE_OTHER): Payer: Medicare Other | Admitting: Ophthalmology

## 2019-11-19 DIAGNOSIS — R531 Weakness: Secondary | ICD-10-CM | POA: Diagnosis not present

## 2019-11-19 DIAGNOSIS — K219 Gastro-esophageal reflux disease without esophagitis: Secondary | ICD-10-CM

## 2019-11-19 DIAGNOSIS — I10 Essential (primary) hypertension: Secondary | ICD-10-CM | POA: Diagnosis not present

## 2019-11-19 DIAGNOSIS — E119 Type 2 diabetes mellitus without complications: Secondary | ICD-10-CM

## 2019-11-19 DIAGNOSIS — R0789 Other chest pain: Secondary | ICD-10-CM | POA: Diagnosis not present

## 2019-11-19 DIAGNOSIS — R1012 Left upper quadrant pain: Secondary | ICD-10-CM | POA: Diagnosis not present

## 2019-11-19 DIAGNOSIS — R1013 Epigastric pain: Secondary | ICD-10-CM

## 2019-11-19 DIAGNOSIS — I480 Paroxysmal atrial fibrillation: Secondary | ICD-10-CM

## 2019-11-19 LAB — BASIC METABOLIC PANEL
Anion gap: 7 (ref 5–15)
BUN: 18 mg/dL (ref 8–23)
CO2: 21 mmol/L — ABNORMAL LOW (ref 22–32)
Calcium: 9 mg/dL (ref 8.9–10.3)
Chloride: 107 mmol/L (ref 98–111)
Creatinine, Ser: 1.28 mg/dL — ABNORMAL HIGH (ref 0.44–1.00)
GFR, Estimated: 41 mL/min — ABNORMAL LOW (ref >60)
Glucose, Bld: 97 mg/dL (ref 70–99)
Potassium: 4.7 mmol/L (ref 3.5–5.1)
Sodium: 135 mmol/L (ref 135–145)

## 2019-11-19 LAB — GLUCOSE, CAPILLARY
Glucose-Capillary: 87 mg/dL (ref 70–99)
Glucose-Capillary: 89 mg/dL (ref 70–99)
Glucose-Capillary: 105 mg/dL — ABNORMAL HIGH (ref 70–99)
Glucose-Capillary: 140 mg/dL — ABNORMAL HIGH (ref 70–99)

## 2019-11-19 LAB — TROPONIN I (HIGH SENSITIVITY)
Troponin I (High Sensitivity): 8 ng/L (ref <18)
Troponin I (High Sensitivity): 9 ng/L (ref <18)

## 2019-11-19 LAB — MAGNESIUM: Magnesium: 1.8 mg/dL (ref 1.7–2.4)

## 2019-11-19 SURGERY — EGD (ESOPHAGOGASTRODUODENOSCOPY)
Anesthesia: Moderate Sedation

## 2019-11-19 SURGERY — ESOPHAGOGASTRODUODENOSCOPY (EGD) WITH PROPOFOL
Anesthesia: Monitor Anesthesia Care

## 2019-11-19 MED ORDER — SODIUM CHLORIDE 0.9 % IV SOLN: INTRAVENOUS | Status: AC

## 2019-11-19 NOTE — Plan of Care (Signed)

## 2019-11-19 NOTE — Progress Notes (Signed)
Patient ambulating to bathroom and started complaining of chest pain 7/10 to left side of chest. Patient ambulated back to the bed. Vital signs obtained and were as follows:  BP 134/61 (BP Location: Left Arm)   Pulse 68   Temp 98.3 F (36.8 C) (Oral)   Resp 18   Ht 5\' 3"  (1.6 m)   Wt 63.2 kg   SpO2 100%   BMI 24.68 kg/m   Notified Dr. Carles Collet. Orders received for STAT troponin and EKG. Respiratory therapy called and will come to EKG.   Patient states pain is 4/10 at rest. She states this does happen occasionally at home and she takes nitroglycerin. Patient does have implanted defibrillator. Patient resting at this time. Will continue to monitor.

## 2019-11-19 NOTE — Progress Notes (Signed)
Subjective:  Patient reports having epigastric and chest pain after eating full liquids. Patient says she has been having postprandial pain for 3 weeks. Pain is in her upper abdomen and chest. However she had pain earlier in the day when she was fasting in preparation for esophagogastroduodenoscopy. Esophagogastroduodenoscopy was canceled because of chest pain. She experiences nausea but she denies vomiting. She says she has lost 70 pounds in 1 year. She does not feel weight loss is due to her not eating. She also complains of diarrhea. She has noted greenish stools today. She says she has had black stools before. She denies shortness of breath.  Current Medications:  Current Facility-Administered Medications:  .  0.9 %  sodium chloride infusion, , Intravenous, Continuous, Tat, David, MD, Last Rate: 75 mL/hr at 11/19/19 1406, New Bag at 11/19/19 1406 .  acetaminophen (TYLENOL) tablet 650 mg, 650 mg, Oral, Q6H PRN, Kathie Dike, MD, 650 mg at 11/17/19 0826 .  amiodarone (PACERONE) tablet 200 mg, 200 mg, Oral, Daily, Memon, Jehanzeb, MD, 200 mg at 11/19/19 1009 .  dicyclomine (BENTYL) capsule 10 mg, 10 mg, Oral, TID AC, Memon, Jolaine Artist, MD, 10 mg at 11/19/19 1717 .  hydrALAZINE (APRESOLINE) injection 10 mg, 10 mg, Intravenous, Q4H PRN, Kathie Dike, MD .  hydrALAZINE (APRESOLINE) tablet 10 mg, 10 mg, Oral, Q8H, Memon, Jehanzeb, MD, 10 mg at 11/19/19 1403 .  hydrocortisone (ANUSOL-HC) suppository 25 mg, 25 mg, Rectal, BID, Memon, Jehanzeb, MD, 25 mg at 11/19/19 1011 .  isosorbide mononitrate (IMDUR) 24 hr tablet 15 mg, 15 mg, Oral, Daily, Memon, Jehanzeb, MD, 15 mg at 11/19/19 1010 .  levothyroxine (SYNTHROID) tablet 75 mcg, 75 mcg, Oral, QAC breakfast, Kathie Dike, MD, 75 mcg at 11/19/19 1010 .  LORazepam (ATIVAN) tablet 0.25 mg, 0.25 mg, Oral, TID PRN, Kathie Dike, MD, 0.25 mg at 11/16/19 1624 .  meclizine (ANTIVERT) tablet 25 mg, 25 mg, Oral, TID, Kathie Dike, MD, 25 mg at  11/19/19 1717 .  pantoprazole (PROTONIX) EC tablet 40 mg, 40 mg, Oral, Daily, Memon, Jehanzeb, MD, 40 mg at 11/19/19 1009 .  PARoxetine (PAXIL) tablet 20 mg, 20 mg, Oral, Daily, Memon, Jehanzeb, MD, 20 mg at 11/19/19 1010 .  polyethylene glycol (MIRALAX / GLYCOLAX) packet 17 g, 17 g, Oral, Daily PRN, Kathie Dike, MD .  prochlorperazine (COMPAZINE) injection 5 mg, 5 mg, Intravenous, Q6H PRN, Reubin Milan, MD .  rosuvastatin (CRESTOR) tablet 20 mg, 20 mg, Oral, Daily, Memon, Jehanzeb, MD, 20 mg at 11/19/19 1009  Objective: Blood pressure 130/67, pulse 66, temperature 98.2 F (36.8 C), temperature source Oral, resp. rate 18, height 5' 3"  (1.6 m), weight 63.2 kg, SpO2 98 %. Patient is alert and appears to be comfortable sitting in reclining chair. Conjunctiva is pink. Sclera is nonicteric Oropharyngeal mucosa is normal. No neck masses or thyromegaly noted. Cardiac exam with regular rhythm normal S1 and S2. No murmur or gallop noted. Lungs are clear to auscultation. Abdomen is symmetrical. Bowel sounds are normal. She does not have abdominal bruit. On palpation abdomen is soft. She has mild tenderness in epigastric region. No LE edema or clubbing noted.  Labs/studies Results:  CBC Latest Ref Rng & Units 11/18/2019 11/17/2019 11/15/2019  WBC 4.0 - 10.5 K/uL 5.6 4.8 6.2  Hemoglobin 12.0 - 15.0 g/dL 11.3(L) 11.3(L) 12.4  Hematocrit 36 - 46 % 35.2(L) 34.6(L) 38.2  Platelets 150 - 400 K/uL 233 203 242    CMP Latest Ref Rng & Units 11/19/2019 11/18/2019 11/17/2019  Glucose 70 -  99 mg/dL 97 110(H) 90  BUN 8 - 23 mg/dL 18 13 13   Creatinine 0.44 - 1.00 mg/dL 1.28(H) 1.11(H) 1.20(H)  Sodium 135 - 145 mmol/L 135 137 138  Potassium 3.5 - 5.1 mmol/L 4.7 3.0(L) 3.7  Chloride 98 - 111 mmol/L 107 107 109  CO2 22 - 32 mmol/L 21(L) 22 21(L)  Calcium 8.9 - 10.3 mg/dL 9.0 8.6(L) 8.6(L)  Total Protein 6.5 - 8.1 g/dL - - 5.9(L)  Total Bilirubin 0.3 - 1.2 mg/dL - - 0.9  Alkaline Phos 38 - 126 U/L - -  56  AST 15 - 41 U/L - - 33  ALT 0 - 44 U/L - - 27    Hepatic Function Latest Ref Rng & Units 11/17/2019 11/15/2019 11/14/2019  Total Protein 6.5 - 8.1 g/dL 5.9(L) 7.3 7.1  Albumin 3.5 - 5.0 g/dL 3.2(L) 4.0 3.9  AST 15 - 41 U/L 33 38 41  ALT 0 - 44 U/L 27 31 33  Alk Phosphatase 38 - 126 U/L 56 67 72  Total Bilirubin 0.3 - 1.2 mg/dL 0.9 0.9 0.7  Bilirubin, Direct 0.0 - 0.2 mg/dL 0.1 - -    CT abdomen and pelvis from yesterday reviewed. Atherosclerotic changes to aorta. Celiac trunk is patent. SMA with mild narrowing at the takeoff. Bile duct is not dilated. No gallstones.  Troponin levels 8 and 9 earlier today  Assessment:  #1. Abdominal and chest pain appears to be of GI origin. Pain generally is triggered by meals but she had pain around noon today while she was fasting. She could be refluxing. Troponin levels are normal and EKG did not show any changes of injury or ischemia. She could also have gastroparesis but first need to rule out peptic ulcer disease or pyloric stenosis. Will proceed with esophagogastroduodenoscopy on 11/20/2019.  #2. History of paroxysmal atrial fibrillation. Eliquis is on hold.  #3. Anemia. She has mild anemia. No evidence of overt GI bleed.  #4. Diabetes mellitus. Diet controlled.  Recommendations  Repeat LFTs with serum lipase. In a.m. Esophagogastroduodenoscopy under monitored anesthesia care to be performed by Dr. Gala Romney.

## 2019-11-19 NOTE — Progress Notes (Addendum)
PROGRESS NOTE  [name] PIR:[id number] DOB: 01-Aug-1931 DOA: 11/14/2019 PCP: [name], DO  Brief History:  86 y.o.femalewith medical history significant ofatrial fibrillation, history of cardioversion, AICD placement, history of CAD, history of MI, chronic systolic heart failure with most recent EF measurement of 40 to 45%, history of near syncopal episode, orthostatic hypotension episodes, hypertension, anxiety, depression, osteoarthritis, cataracts, chronic back pain, stage III chronic kidney disease, history of DVT, diet controlled type 2 diabetes, diverticulosis, history of esophageal stricture, history of esophagitis, GERD, hiatal hernia, history of rectal bleeding, hyperlipidemia, macular degeneration, nephrolithiasis who is brought to the emergency department via EMS due toweakness and dizziness for the past 2 weeks. The patient thinks that she may have had some chest pain, but does not have any symptomatology at the time of admission. She denies pain radiation, diaphoresis, palpitations, orthopnea or pitting edema of the lower extremities. The patient also complains that she has been having abdominal pain with decreased appetite, early satiety, bloating sensation and change in bowel habits for the past 2 weeks. She denies nausea, vomiting, diarrhea, melena or hematochezia. No dysuria, frequency or hematuria. She denies trauma sore throat, rhinorrhea, productive cough, dyspnea, wheezing or hemoptysis.   Assessment/Plan: Abdominal pain -CT abdomen indicated questionable appendicitis -Seen by general surgery this was felt to be unlikely -She does not have any fever or leukocytosis -She did describe having periods of diarrhea, but in the past 2 weeks was on the more constipated side -She was started on MiraLAX and Linzess with multiple bowel movements -She continues to have abdominal pain after eating and drinking -Since she is having frequent stools,  MiraLAX and Linzess have been discontinued -She has been started on Bentyl for possible IBS component -Her CT did describe several areas of arterial calcifications -11/2 CTA abd--main mesenteric arteries are patent -11/3--planning EGD -appears to have musculoskeletal component  Vertigo -She had similar episodes in the past, but symptoms appear to be worse over the past few weeks -She does have declined in systolic blood pressure from 170s to 140s on standing -MRI imaging not an option due to defibrillator -Started on meclizine and as needed benzos -Overall dizziness has improved  Hyponatremia -Suspect related to decreased p.o. intake and volume depletion -Improved with IVF  Acute on chronic renal failure--CKD 3B -baseline creatinine 1.2-1.3 -presented with serum creatinine 1.55 -improved with IVF  Paroxysmal atrial fibrillation -Anticoagulated with Eliquis--holding temporarily for EGD -Followed at the [facility] -Started on amiodarone recently  Chronic Systolic CHF -06/19/99 Echo --LVEF 40 to 45% with no left ventricular hypertrophy. She had global hypokinesis of the left ventricle. -Euvolemic on exam  Diabetes, diet controlled -Monitor blood sugars--acceptable  Hypertension -Resume home doses of hydralazine -Blood pressure currently stable -Use IV hydralazine as needed for high blood pressures  Hypothyroidism. -Resume home dose of Synthroid  Coronary artery disease/Chest Pain -No complaints of chest pain -Continue statin, Imdur -11/3--personally reviewed EKG--sinus, nonspecific T wave changes -check troponins - cardiac catheterization 2015 that did not show any significant coronary stenoses. -CP seems GI related according to pt's description -somewhat reproducible on palpation -consult cardiology -echo  Polymorphic VT -no Vtach since admission -continue amiodarone  Advance care planning -Discussed CODE STATUS with patient -She agrees to  DNR -[name] was present for the conversation  Generalized weakness -Seen by physical therapy with recommendations for home health physical therapy  Abnormal CTA abd -noted to have focal dilatation in the infrarenal abdominal  aorta with a focal dissection or penetrating ulcer -CT reviewed with vascular surgery Dr. Scot Dock -Recommendations were that CT findings indicate a very small area which is chronic and unimpressive -considering advanced age and unimpressive findings on imaging, no further work up or intervention was recommended at this time.    Status is: Inpatient  Remains inpatient appropriate because:IV treatments appropriate due to intensity of illness or inability to take PO   Dispo: The patient is from: Home              Anticipated d/c is to: Home              Anticipated d/c date is: 1 day              Patient currently is not medically stable to d/c.        Family Communication:   Son updated at bedside 11/3  Consultants:  GI  Code Status: DNR  DVT Prophylaxis:  apixaban on hold   Procedures: As Listed in Progress Note Above  Antibiotics: None    Total time spent 35 minutes.  Greater than 50% spent face to face counseling and coordinating care.   Subjective: Patient states she has LUQ pain that radiates into her left chest, particularly when she lays supine.  Still has some dypsnea on exertion.  Denies f/c, n/v/d, hematochezia, melena  Objective: Vitals:   11/18/19 2157 11/19/19 0508 11/19/19 1012 11/19/19 1144  BP:  (!) 144/70 138/81 134/61  Pulse:  73 70 68  Resp:  17 18 18   Temp: 98.2 F (36.8 C) 98.7 F (37.1 C) 98.3 F (36.8 C)   TempSrc:  Oral Oral Oral  SpO2:  97% 100% 100%  Weight:      Height:        Intake/Output Summary (Last 24 hours) at 11/19/2019 1300 Last data filed at 11/18/2019 1700 Gross per 24 hour  Intake 240 ml  Output --  Net 240 ml   Weight change:  Exam:   General:  Pt is alert, follows  commands appropriately, not in acute distress  HEENT: No icterus, No thrush, No neck mass, [place]/AT  Cardiovascular: RRR, S1/S2, no rubs, no gallops  Respiratory: CTA bilaterally, no wheezing, no crackles, no rhonchi  Abdomen: Soft/+BS, non tender, non distended, no guarding  Extremities: No edema, No lymphangitis, No petechiae, No rashes, no synovitis   Data Reviewed: I have personally reviewed following labs and imaging studies Basic Metabolic Panel: Recent Labs  Lab 11/14/19 2156 11/14/19 2156 11/15/19 0422 11/16/19 0605 11/17/19 0421 11/18/19 0550 11/19/19 0438  NA 133*   < > 139 135 138 137 135  K 3.8   < > 3.9 4.3 3.7 3.0* 4.7  CL 97*   < > 98 102 109 107 107  CO2 26   < > 28 25 21* 22 21*  GLUCOSE 121*   < > 115* 107* 90 110* 97  BUN 23   < > 21 19 13 13 18   CREATININE 1.55*   < > 1.35* 1.47* 1.20* 1.11* 1.28*  CALCIUM 9.4   < > 10.2 8.6* 8.6* 8.6* 9.0  MG 2.2  --   --   --   --   --  1.8   < > = values in this interval not displayed.   Liver Function Tests: Recent Labs  Lab 11/14/19 2156 11/15/19 0422 11/17/19 0421  AST 41 38 33  ALT 33 31 27  ALKPHOS 72 67 56  BILITOT 0.7  0.9 0.9  PROT 7.1 7.3 5.9*  ALBUMIN 3.9 4.0 3.2*   No results for input(s): LIPASE, AMYLASE in the last 168 hours. No results for input(s): AMMONIA in the last 168 hours. Coagulation Profile: Recent Labs  Lab 11/14/19 2156  INR 1.3*   CBC: Recent Labs  Lab 11/14/19 2156 11/15/19 0422 11/17/19 0421 11/18/19 0550  WBC 5.5 6.2 4.8 5.6  NEUTROABS 3.4 3.1  --   --   HGB 12.8 12.4 11.3* 11.3*  HCT 39.7 38.2 34.6* 35.2*  MCV 96.8 96.2 98.3 97.8  PLT 249 242 203 233   Cardiac Enzymes: No results for input(s): CKTOTAL, CKMB, CKMBINDEX, TROPONINI in the last 168 hours. BNP: Invalid input(s): POCBNP CBG: Recent Labs  Lab 11/17/19 2359 11/18/19 0753 11/18/19 1117 11/19/19 0511 11/19/19 0712  GLUCAP 95 86 155* 87 89   HbA1C: No results for input(s): HGBA1C in the last 72  hours. Urine analysis:    Component Value Date/Time   COLORURINE STRAW (A) 11/14/2019 2145   APPEARANCEUR CLEAR 11/14/2019 2145   APPEARANCEUR Clear 08/15/2019 0856   LABSPEC 1.003 (L) 11/14/2019 2145   PHURINE 8.0 11/14/2019 2145   GLUCOSEU NEGATIVE 11/14/2019 2145   HGBUR MODERATE (A) 11/14/2019 2145   BILIRUBINUR NEGATIVE 11/14/2019 2145   BILIRUBINUR Negative 08/15/2019 0856   KETONESUR NEGATIVE 11/14/2019 2145   PROTEINUR NEGATIVE 11/14/2019 2145   UROBILINOGEN negative 03/15/2015 1352   UROBILINOGEN 0.2 07/25/2011 2213   NITRITE NEGATIVE 11/14/2019 2145   LEUKOCYTESUR NEGATIVE 11/14/2019 2145   Sepsis Labs: @LABRCNTIP (procalcitonin:4,lacticidven:4) ) Recent Results (from the past 240 hour(s))  Respiratory Panel by RT PCR (Flu A&B, Covid) - Nasopharyngeal Swab     Status: None   Collection Time: 11/15/19  3:13 AM   Specimen: Nasopharyngeal Swab  Result Value Ref Range Status   SARS Coronavirus 2 by RT PCR NEGATIVE NEGATIVE Final    Comment: (NOTE) SARS-CoV-2 target nucleic acids are NOT DETECTED.  The SARS-CoV-2 RNA is generally detectable in upper respiratoy specimens during the acute phase of infection. The lowest concentration of SARS-CoV-2 viral copies this assay can detect is 131 copies/mL. A negative result does not preclude SARS-Cov-2 infection and should not be used as the sole basis for treatment or other patient management decisions. A negative result may occur with  improper specimen collection/handling, submission of specimen other than nasopharyngeal swab, presence of viral mutation(s) within the areas targeted by this assay, and inadequate number of viral copies (<131 copies/mL). A negative result must be combined with clinical observations, patient history, and epidemiological information. The expected result is Negative.  Fact Sheet for Patients:  PinkCheek.be  Fact Sheet for Healthcare Providers:   GravelBags.it  This test is no t yet approved or cleared by the [place] FDA and  has been authorized for detection and/or diagnosis of SARS-CoV-2 by FDA under an Emergency Use Authorization (EUA). This EUA will remain  in effect (meaning this test can be used) for the duration of the COVID-19 declaration under Section 564(b)(1) of the Act, 21 U.S.C. section 360bbb-3(b)(1), unless the authorization is terminated or revoked sooner.     Influenza A by PCR NEGATIVE NEGATIVE Final   Influenza B by PCR NEGATIVE NEGATIVE Final    Comment: (NOTE) The Xpert Xpress SARS-CoV-2/FLU/RSV assay is intended as an aid in  the diagnosis of influenza from Nasopharyngeal swab specimens and  should not be used as a sole basis for treatment. Nasal washings and  aspirates are unacceptable for Xpert Xpress SARS-CoV-2/FLU/RSV  testing.  Fact Sheet for Patients: PinkCheek.be  Fact Sheet for Healthcare Providers: GravelBags.it  This test is not yet approved or cleared by the [place] FDA and  has been authorized for detection and/or diagnosis of SARS-CoV-2 by  FDA under an Emergency Use Authorization (EUA). This EUA will remain  in effect (meaning this test can be used) for the duration of the  Covid-19 declaration under Section 564(b)(1) of the Act, 21  U.S.C. section 360bbb-3(b)(1), unless the authorization is  terminated or revoked. Performed at [facility], [address] [place], [place] [address]      Scheduled Meds: . amiodarone  200 mg Oral Daily  . dicyclomine  10 mg Oral TID AC  . hydrALAZINE  10 mg Oral Q8H  . hydrocortisone  25 mg Rectal BID  . isosorbide mononitrate  15 mg Oral Daily  . levothyroxine  75 mcg Oral QAC breakfast  . meclizine  25 mg Oral TID  . pantoprazole  40 mg Oral Daily  . PARoxetine  20 mg Oral Daily  . rosuvastatin  20 mg Oral Daily   Continuous  Infusions:  Procedures/Studies: DG Chest Portable 1 View  Result Date: 11/14/2019 CLINICAL DATA:  Chest pain EXAM: PORTABLE CHEST 1 VIEW COMPARISON:  Radiograph 07/24/2019 FINDINGS: Cardiac pacer/defibrillator device is again seen with leads in stable position towards the cardiac apex with additional lead seen projecting over the left upper quadrant. Some chronically coarsened interstitial changes and hyperexpansion are similar to prior. No consolidation, features of edema, pneumothorax, or effusion. Stable cardiomediastinal contours with a calcified aorta. The osseous structures appear diffusely demineralized which may limit detection of small or nondisplaced fractures. No acute osseous or soft tissue abnormality. IMPRESSION: 1. No acute cardiopulmonary abnormality. 2. Stable chronically coarsened interstitial changes and hyperexpansion. Electronically Signed   By: [name] M.D.   On: 11/14/2019 22:33   CT Renal Stone Study  Result Date: 11/15/2019 CLINICAL DATA:  Lower abdominal pain with dizziness and hematuria. EXAM: CT ABDOMEN AND PELVIS WITHOUT CONTRAST TECHNIQUE: Multidetector CT imaging of the abdomen and pelvis was performed following the standard protocol without IV contrast. COMPARISON:  July 25, 2019 FINDINGS: Lower chest: No acute abnormality. Hepatobiliary: No focal liver abnormality is seen. No gallstones, gallbladder wall thickening, or biliary dilatation. Pancreas: Unremarkable. No pancreatic ductal dilatation or surrounding inflammatory changes. Spleen: Normal in size without focal abnormality. Adrenals/Urinary Tract: Adrenal glands are unremarkable. The left kidney is asymmetrically small when compared to a normal size right kidney. A 1.4 cm x 0.8 cm area of low attenuation is seen within the anterior aspect of the lower pole of the right kidney. Numerous a subcentimeter non-obstructing renal stones of various sizes are again seen throughout both kidneys very mild bilateral  hydronephrosis and hydroureter are seen without evidence of obstructing renal calculi. Bladder is unremarkable. Stomach/Bowel: There is a small hiatal hernia. Very mild appendiceal wall thickening is noted. No significant periappendiceal inflammatory fat stranding is seen. No evidence of bowel wall thickening, distention, or inflammatory changes. Vascular/Lymphatic: There is moderate to marked severity calcification and tortuosity of the abdominal aorta and bilateral common iliac arteries, without evidence of aneurysmal dilatation. No enlarged abdominal or pelvic lymph nodes. Reproductive: Uterus and bilateral adnexa are unremarkable. Other: No abdominal wall hernia or abnormality. No abdominopelvic ascites. Musculoskeletal: There is moderate to marked severity levoscoliosis of the lumbar spine with marked severity degenerative changes seen throughout the lumbar spine. IMPRESSION: 1. Mild appendiceal wall thickening, without periappendiceal inflammatory fat stranding. Early appendicitis cannot completely  be excluded. 2. Numerous bilateral subcentimeter non-obstructing renal stones. 3. Small hiatal hernia. 4. Moderate to marked severity levoscoliosis of the lumbar spine with marked severity degenerative changes throughout the lumbar spine. 5. Aortic atherosclerosis. Aortic Atherosclerosis (ICD10-I70.0). Electronically Signed   By: Virgina Norfolk M.D.   On: 11/15/2019 01:50   CUP PACEART REMOTE DEVICE CHECK  Result Date: 10/23/2019 Scheduled remote reviewed. Normal device function.  Next remote 91 days.  CT Angio Abd/Pel w/ and/or w/o  Result Date: 11/18/2019 CLINICAL DATA:  Abdominal pain with decreased appetite. Evaluate for chronic mesenteric ischemia. EXAM: CTA ABDOMEN AND PELVIS  WITH CONTRAST TECHNIQUE: Multidetector CT imaging of the abdomen and pelvis was performed using the standard protocol during bolus administration of intravenous contrast. Multiplanar reconstructed images and MIPs were obtained  and reviewed to evaluate the vascular anatomy. CONTRAST:  5mL OMNIPAQUE IOHEXOL 350 MG/ML SOLN COMPARISON:  CT renal stone protocol 11/15/2019 and 01/11/2019 FINDINGS: VASCULAR Aorta: Diffuse atherosclerotic disease involving the abdominal aorta. Abdominal aorta is tortuous and related to the levoscoliosis. Again noted is a focal dilatation in the infrarenal aorta just beyond the takeoff of the IMA. This focal dilatation measures up to 2.4 cm and not significantly changed since 03/13/2018. In addition, there is an intimal flap along the left side of the abdominal aorta at this area of dilatation. This is unchanged from the exam in 2020 and could represent a focal dissection versus penetrating ulcer. No significant aortic stenosis. Celiac: Patent without evidence of aneurysm, dissection, vasculitis or significant stenosis. SMA: Mild narrowing at the origin of the SMA. The stenosis does not appear to be hemodynamically significant. Main SMA branches are widely patent. No evidence for aneurysm or dissection. Renals: Calcified plaque involving the origin of the left renal artery and findings are suggestive for high-grade stenosis and near occlusion of the left renal artery. Right renal artery is widely patent at the origin but has a beaded appearance, best seen on the coronal reformats, sequence 7, image 87. Findings are compatible with fibromuscular dysplasia and there was a similar finding on the previous examination. IMA: Patent Inflow: Bilateral common and external iliac arteries are widely patent without significant stenosis. Bilateral internal iliac arteries are patent with narrowing involving the left internal iliac artery. Proximal Outflow: Proximal femoral arteries are patent bilaterally. Veins: Portal venous system is patent. No gross abnormality to the IVC or iliac veins. Hepatic veins are patent. Review of the MIP images confirms the above findings. NON-VASCULAR Lower chest: Defibrillator leads extending  into the right heart. Peripheral 5 mm density in the right lower lobe on sequence 11, image 17 has minimally changed since 2013 and suggestive for a benign nodule. Motion artifact at the lung bases. No significant pleural effusions. Hepatobiliary: Normal appearance of the liver, gallbladder and portal venous system. No biliary dilatation Pancreas: Round low-density structure at the pancreatic neck region measures up to 8 mm on sequence 4, image 66. This is suggestive for small cystic lesion and unchanged since 2020. No significant pancreatic dilatation. No pancreatic inflammation or stranding. Spleen: Normal in size without focal abnormality. Adrenals/Urinary Tract: Adrenal glands are within normal limits. Left kidney is atrophy and likely associated with the high-grade left renal artery stenosis. Large cortical calcification involving the left kidney lower pole. Scattered calcifications in both kidneys. 7 mm stone in the right kidney lower pole. 8 mm stone in the right kidney interpolar region. Small right renal cysts. No suspicious renal lesions. No hydronephrosis. Urinary bladder is unremarkable. Stomach/Bowel: Mild wall thickening  in the distal stomach is nonspecific. No inflammatory changes around the duodenum or stomach. Fluid-filled colon. No focal bowel inflammation and no evidence for bowel obstruction. Appendix contains fluid but no surrounding inflammatory changes. No significant small bowel dilatation. Lymphatic: No abdominopelvic lymphadenopathy. Reproductive: Uterus and bilateral adnexa are unremarkable. Other: Negative for ascites.  Negative for free air. Musculoskeletal: Again noted are multiple cardiac leads in the anterior abdominal subcutaneous tissues. Levoscoliosis of the lumbar spine. Stable old compression deformity involving the T12 vertebral body. Multilevel degenerative changes in the lumbar spine. IMPRESSION: VASCULAR 1. No evidence for chronic mesenteric ischemia. The main mesenteric  arteries are patent without significant stenosis. 2. Stable appearance of the abdominal aorta with focal dilatation in the infrarenal abdominal aorta associated with a focal dissection or penetrating ulcer. 3. Fibromuscular dysplasia involving the right renal artery. 4. Critical stenosis involving the left renal artery with associated left renal atrophy. NON-VASCULAR 1. No acute abnormality abdomen or pelvis. 2. Stable 8 mm low-density structure in the pancreatic head/neck region. This probably represents a small cystic lesion and could represent an IPMN. Recommend follow up pre and post contrast MRI/MRCP or pancreatic protocol CT in 2 years. This recommendation follows ACR consensus guidelines: Management of Incidental Pancreatic Cysts: A White Paper of the ACR Incidental Findings Committee. J Am Coll Radiol 3893;[id number]. 3. Bilateral renal calculi.  No hydronephrosis. Electronically Signed   By: Markus Daft M.D.   On: 11/18/2019 10:46    Orson Eva, DO  [facility]  If 7PM-7AM, please contact night-coverage [email or web address] Password [email or web address] 11/19/2019, 1:00 PM   LOS: 4 days

## 2019-11-19 NOTE — Progress Notes (Signed)
Physical Therapy Treatment Patient Details Name: [name] MRN: [id number] DOB: 1932-01-05 Today's Date: 11/19/2019    History of Present Illness 86 y.o. female with PMH of afib, cardioversion, AICD placement, CAD, MI, chronic systolic heart failure with most recent EF measurement of 40 to 45%, near syncopal episode, orthostatic hypotension episodes, HTN, anxiety, depression, OA, cataracts, chronic back pain, stage III CKD, DVT, diabetes, macular degeneration with c/o weakness, dizziness, abdominal pain with decreased apetite, early satiety, blating and change in bowel over the past 2 weeks.    PT Comments    Patient presents seated in chair (assisted by nursing staff) and agreeable for therapy.  Patient demonstrates increased endurance/distance for ambulation without loss of balance, good return for making turns using RW, limited mostly due to mild SOB and fatigue and continued sitting up in chair after therapy.  Patient will benefit from continued physical therapy in hospital and recommended venue below to increase strength, balance, endurance for safe ADLs and gait.    Follow Up Recommendations  Home health PT;Supervision for mobility/OOB;Supervision - Intermittent     Equipment Recommendations  Other (comment)    Recommendations for Other Services       Precautions / Restrictions Precautions Precautions: Fall Restrictions Weight Bearing Restrictions: No    Mobility  Bed Mobility               General bed mobility comments: Patient presents seated in chair (assisted by nursing staff)  Transfers Overall transfer level: Needs assistance Equipment used: Rolling walker (2 wheeled) Transfers: Sit to/from Stand;Stand Pivot Transfers Sit to Stand: Supervision Stand pivot transfers: Supervision       General transfer comment: increased time, slightly labored movement  Ambulation/Gait   Gait Distance (Feet): 100 Feet Assistive device: Rolling walker (2  wheeled) Gait Pattern/deviations: Step-through pattern;Decreased step length - right;Decreased step length - left;Decreased stride length Gait velocity: decreased   General Gait Details: slightly labored cadence without loss of balance, limited secondary to fatigue   Stairs             Wheelchair Mobility    Modified Rankin (Stroke Patients Only)       Balance Overall balance assessment: Needs assistance Sitting-balance support: Feet supported;No upper extremity supported Sitting balance-Leahy Scale: Good Sitting balance - Comments: seated at edge of chair   Standing balance support: During functional activity;Bilateral upper extremity supported Standing balance-Leahy Scale: Fair Standing balance comment: using RW                            Cognition Arousal/Alertness: Awake/alert Behavior During Therapy: WFL for tasks assessed/performed Overall Cognitive Status: Within Functional Limits for tasks assessed                                        Exercises General Exercises - Lower Extremity Long Arc Quad: Seated;AROM;Strengthening;Both;10 reps Hip Flexion/Marching: Seated;AROM;Strengthening;Both;10 reps Toe Raises: Seated;AROM;Strengthening;Both;15 reps Heel Raises: Seated;AROM;Strengthening;Both;15 reps    General Comments        Pertinent Vitals/Pain Pain Assessment: 0-10 Pain Score: 6  Pain Location: stomach Pain Descriptors / Indicators: Aching (intermittent) Pain Intervention(s): Limited activity within patient's tolerance;Monitored during session    Home Living                      Prior Function  PT Goals (current goals can now be found in the care plan section) Acute Rehab PT Goals Patient Stated Goal: return home with family to assist PT Goal Formulation: With patient/family Time For Goal Achievement: 11/30/19 Potential to Achieve Goals: Fair Progress towards PT goals: Progressing toward  goals    Frequency    Min 3X/week      PT Plan Current plan remains appropriate    Co-evaluation              AM-PAC PT "6 Clicks" Mobility   Outcome Measure  Help needed turning from your back to your side while in a flat bed without using bedrails?: None Help needed moving from lying on your back to sitting on the side of a flat bed without using bedrails?: None Help needed moving to and from a bed to a chair (including a wheelchair)?: A Little Help needed standing up from a chair using your arms (e.g., wheelchair or bedside chair)?: A Little Help needed to walk in hospital room?: A Little Help needed climbing 3-5 steps with a railing? : A Little 6 Click Score: 20    End of Session   Activity Tolerance: Patient tolerated treatment well;Patient limited by fatigue Patient left: in chair;with call bell/phone within reach Nurse Communication: Mobility status PT Visit Diagnosis: Unsteadiness on feet (R26.81);Other abnormalities of gait and mobility (R26.89);Muscle weakness (generalized) (M62.81)     Time: 2876-8115 PT Time Calculation (min) (ACUTE ONLY): 28 min  Charges:  $Gait Training: 8-22 mins $Therapeutic Exercise: 8-22 mins                     12:34 PM, 11/19/19 Lonell Grandchild, MPT Physical Therapist with [facility] [phone] office [phone] mobile phone

## 2019-11-19 NOTE — Plan of Care (Signed)
  Problem: Education: Goal: Knowledge of General Education information will improve Description: Including pain rating scale, medication(s)/side effects and non-pharmacologic comfort measures 11/19/2019 2351 by Baldemar Friday, RN Outcome: Progressing 11/19/2019 2350 by Baldemar Friday, RN Outcome: Progressing   Problem: Health Behavior/Discharge Planning: Goal: Ability to manage health-related needs will improve 11/19/2019 2351 by Baldemar Friday, RN Outcome: Progressing 11/19/2019 2350 by Baldemar Friday, RN Outcome: Progressing   Problem: Clinical Measurements: Goal: Ability to maintain clinical measurements within normal limits will improve 11/19/2019 2351 by Baldemar Friday, RN Outcome: Progressing 11/19/2019 2350 by Baldemar Friday, RN Outcome: Progressing Goal: Will remain free from infection 11/19/2019 2351 by Baldemar Friday, RN Outcome: Progressing 11/19/2019 2350 by Baldemar Friday, RN Outcome: Progressing Goal: Diagnostic test results will improve 11/19/2019 2351 by Baldemar Friday, RN Outcome: Progressing 11/19/2019 2350 by Baldemar Friday, RN Outcome: Progressing Goal: Respiratory complications will improve 11/19/2019 2351 by Baldemar Friday, RN Outcome: Progressing 11/19/2019 2350 by Baldemar Friday, RN Outcome: Progressing Goal: Cardiovascular complication will be avoided 11/19/2019 2351 by Baldemar Friday, RN Outcome: Progressing 11/19/2019 2350 by Baldemar Friday, RN Outcome: Progressing   Problem: Activity: Goal: Risk for activity intolerance will decrease 11/19/2019 2351 by Baldemar Friday, RN Outcome: Progressing 11/19/2019 2350 by Baldemar Friday, RN Outcome: Progressing   Problem: Nutrition: Goal: Adequate nutrition will be maintained 11/19/2019 2351 by Baldemar Friday, RN Outcome: Progressing 11/19/2019 2350 by Baldemar Friday, RN Outcome: Progressing   Problem: Coping: Goal: Level of anxiety will decrease 11/19/2019 2351 by Baldemar Friday, RN Outcome:  Progressing 11/19/2019 2350 by Baldemar Friday, RN Outcome: Progressing   Problem: Elimination: Goal: Will not experience complications related to bowel motility 11/19/2019 2351 by Baldemar Friday, RN Outcome: Progressing 11/19/2019 2350 by Baldemar Friday, RN Outcome: Progressing Goal: Will not experience complications related to urinary retention 11/19/2019 2351 by Baldemar Friday, RN Outcome: Progressing 11/19/2019 2350 by Baldemar Friday, RN Outcome: Progressing   Problem: Pain Managment: Goal: General experience of comfort will improve 11/19/2019 2351 by Baldemar Friday, RN Outcome: Progressing 11/19/2019 2350 by Baldemar Friday, RN Outcome: Progressing   Problem: Safety: Goal: Ability to remain free from injury will improve 11/19/2019 2351 by Baldemar Friday, RN Outcome: Progressing 11/19/2019 2350 by Baldemar Friday, RN Outcome: Progressing   Problem: Skin Integrity: Goal: Risk for impaired skin integrity will decrease 11/19/2019 2351 by Baldemar Friday, RN Outcome: Progressing 11/19/2019 2350 by Baldemar Friday, RN Outcome: Progressing

## 2019-11-20 ENCOUNTER — Inpatient Hospital Stay (HOSPITAL_COMMUNITY): Payer: Medicare Other

## 2019-11-20 ENCOUNTER — Encounter (HOSPITAL_COMMUNITY): Payer: Self-pay | Admitting: Internal Medicine

## 2019-11-20 ENCOUNTER — Inpatient Hospital Stay (HOSPITAL_COMMUNITY): Payer: Medicare Other | Admitting: Anesthesiology

## 2019-11-20 ENCOUNTER — Encounter (HOSPITAL_COMMUNITY)
Admission: EM | Disposition: A | Discharge status: Home / Self Care | Payer: Self-pay | Source: Home / Self Care | Attending: Internal Medicine

## 2019-11-20 DIAGNOSIS — I351 Nonrheumatic aortic (valve) insufficiency: Secondary | ICD-10-CM

## 2019-11-20 DIAGNOSIS — I361 Nonrheumatic tricuspid (valve) insufficiency: Secondary | ICD-10-CM | POA: Diagnosis not present

## 2019-11-20 DIAGNOSIS — I480 Paroxysmal atrial fibrillation: Secondary | ICD-10-CM | POA: Diagnosis not present

## 2019-11-20 DIAGNOSIS — E871 Hypo-osmolality and hyponatremia: Secondary | ICD-10-CM | POA: Diagnosis not present

## 2019-11-20 DIAGNOSIS — R935 Abnormal findings on diagnostic imaging of other abdominal regions, including retroperitoneum: Secondary | ICD-10-CM | POA: Diagnosis not present

## 2019-11-20 DIAGNOSIS — E869 Volume depletion, unspecified: Secondary | ICD-10-CM

## 2019-11-20 DIAGNOSIS — R079 Chest pain, unspecified: Secondary | ICD-10-CM

## 2019-11-20 DIAGNOSIS — R1010 Upper abdominal pain, unspecified: Secondary | ICD-10-CM | POA: Diagnosis not present

## 2019-11-20 HISTORY — PX: ESOPHAGOGASTRODUODENOSCOPY (EGD) WITH PROPOFOL: SHX5813

## 2019-11-20 LAB — HEPATIC FUNCTION PANEL
ALT: 31 U/L (ref 0–44)
AST: 40 U/L (ref 15–41)
Albumin: 3.1 g/dL — ABNORMAL LOW (ref 3.5–5.0)
Alkaline Phosphatase: 55 U/L (ref 38–126)
Bilirubin, Direct: 0.1 mg/dL (ref 0.0–0.2)
Indirect Bilirubin: 0.6 mg/dL (ref 0.3–0.9)
Total Bilirubin: 0.7 mg/dL (ref 0.3–1.2)
Total Protein: 5.8 g/dL — ABNORMAL LOW (ref 6.5–8.1)

## 2019-11-20 LAB — ECHOCARDIOGRAM COMPLETE
AR max vel: 3.21 cm2
AV Area VTI: 3.52 cm2
AV Area mean vel: 3.36 cm2
AV Mean grad: 2.6 mmHg
AV Peak grad: 5.7 mmHg
Ao pk vel: 1.19 m/s
Area-P 1/2: 3.72 cm2
Calc EF: 58.6 %
Height: 63 in
P 1/2 time: 741 msec
S' Lateral: 3.45 cm
Single Plane A2C EF: 50.8 %
Single Plane A4C EF: 64.6 %
Weight: 2229.29 oz

## 2019-11-20 LAB — BASIC METABOLIC PANEL
Anion gap: 7 (ref 5–15)
BUN: 21 mg/dL (ref 8–23)
CO2: 19 mmol/L — ABNORMAL LOW (ref 22–32)
Calcium: 8.7 mg/dL — ABNORMAL LOW (ref 8.9–10.3)
Chloride: 110 mmol/L (ref 98–111)
Creatinine, Ser: 1.29 mg/dL — ABNORMAL HIGH (ref 0.44–1.00)
GFR, Estimated: 40 mL/min — ABNORMAL LOW (ref >60)
Glucose, Bld: 94 mg/dL (ref 70–99)
Potassium: 3.5 mmol/L (ref 3.5–5.1)
Sodium: 136 mmol/L (ref 135–145)

## 2019-11-20 LAB — GLUCOSE, CAPILLARY
Glucose-Capillary: 76 mg/dL (ref 70–99)
Glucose-Capillary: 84 mg/dL (ref 70–99)
Glucose-Capillary: 83 mg/dL (ref 70–99)

## 2019-11-20 LAB — MAGNESIUM: Magnesium: 1.8 mg/dL (ref 1.7–2.4)

## 2019-11-20 LAB — LIPASE, BLOOD: Lipase: 29 U/L (ref 11–51)

## 2019-11-20 SURGERY — ESOPHAGOGASTRODUODENOSCOPY (EGD) WITH PROPOFOL
Anesthesia: General

## 2019-11-20 MED ORDER — PROPOFOL 10 MG/ML IV BOLUS
INTRAVENOUS | Status: DC | PRN
  Administered 2019-11-20: 40 mg via INTRAVENOUS

## 2019-11-20 MED ORDER — LACTATED RINGERS IV SOLN
INTRAVENOUS | Status: DC
  Administered 2019-11-20: 1000 mL via INTRAVENOUS

## 2019-11-20 MED ORDER — SODIUM CHLORIDE 0.9 % IV SOLN: INTRAVENOUS | Status: DC

## 2019-11-20 MED ORDER — LIDOCAINE VISCOUS HCL 2 % MT SOLN
OROMUCOSAL | Status: AC
  Filled 2019-11-20: qty 15

## 2019-11-20 MED ORDER — PROPOFOL 500 MG/50ML IV EMUL
INTRAVENOUS | Status: DC | PRN
  Administered 2019-11-20: 100 ug/kg/min via INTRAVENOUS

## 2019-11-20 MED ORDER — GLYCOPYRROLATE 0.2 MG/ML IJ SOLN
INTRAMUSCULAR | Status: AC
  Filled 2019-11-20: qty 1

## 2019-11-20 NOTE — Interval H&P Note (Signed)
History and Physical Interval Note:  11/20/2019 12:33 PM  DARLETH EUSTACHE  has presented today for surgery, with the diagnosis of postprandial epigastric pain.  The various methods of treatment have been discussed with the patient and family. After consideration of risks, benefits and other options for treatment, the patient has consented to  Procedure(s): ESOPHAGOGASTRODUODENOSCOPY (EGD) WITH PROPOFOL (N/A) as a surgical intervention.  The patient's history has been reviewed, patient examined, no change in status, stable for surgery.  I have reviewed the patient's chart and labs.  Questions were answered to the patient's satisfaction.     Manus Rudd  Patient seen and examined in short stay.  She denies dysphagia.  She denies abdominal or chest pain today. Serum lipase and LFTs okay.  We will proceed with a diagnostic EGD per plan today.  The risks, benefits, limitations, alternatives and imponderables have been reviewed with the patient. Potential for esophageal dilation, biopsy, etc. have also been reviewed.  Questions have been answered. All parties agreeable.   Further recommendations to follow.

## 2019-11-20 NOTE — Interval H&P Note (Signed)
History and Physical Interval Note:  11/20/2019 12:38 PM  [name]  has presented today for surgery, with the diagnosis of postprandial epigastric pain.  The various methods of treatment have been discussed with the patient and family. After consideration of risks, benefits and other options for treatment, the patient has consented to  Procedure(s): ESOPHAGOGASTRODUODENOSCOPY (EGD) WITH PROPOFOL (N/A) as a surgical intervention.  The patient's history has been reviewed, patient examined, no change in status, stable for surgery.  I have reviewed the patient's chart and labs.  Questions were answered to the patient's satisfaction.     Manus Rudd

## 2019-11-20 NOTE — Progress Notes (Signed)
*  PRELIMINARY RESULTS* Echocardiogram 2D Echocardiogram has been performed.  Samuel Germany 11/20/2019, 2:55 PM

## 2019-11-20 NOTE — Op Note (Signed)
[facility] Patient Name: [name] Procedure Date: 11/20/2019 12:31 PM MRN: [id number] Date of Birth: [birth year] Attending MD: Norvel Richards , MD CSN: [id number] Age: 86 Admit Type: Ambulatory Procedure:                Upper GI endoscopy Indications:              Epigastric abdominal pain Providers:                Norvel Richards, MD, Otis Peak B. Gwenlyn Perking Therapist, sports, RN,                            Suzan Garibaldi. Risa Grill, Technician, Aram Candela Referring MD:              Medicines:                Propofol per Anesthesia Complications:            No immediate complications. Estimated Blood Loss:     Estimated blood loss: none. Procedure:                Pre-Anesthesia Assessment:                           - Prior to the procedure, a History and Physical                            was performed, and patient medications and                            allergies were reviewed. The patient's tolerance of                            previous anesthesia was also reviewed. The risks                            and benefits of the procedure and the sedation                            options and risks were discussed with the patient.                            All questions were answered, and informed consent                            was obtained. Prior Anticoagulants: The patient                            last took Eliquis (apixaban) 3 days prior to the                            procedure. ASA Grade Assessment: III - A patient                            with severe systemic disease. After reviewing the  risks and benefits, the patient was deemed in                            satisfactory condition to undergo the procedure.                           After obtaining informed consent, the endoscope was                            passed under direct vision. Throughout the                            procedure, the patient's blood pressure, pulse, and                             oxygen saturations were monitored continuously. The                            [id number]-H190 ([id number]) scope was introduced through the                            mouth, and advanced to the second part of duodenum.                            The upper GI endoscopy was accomplished without                            difficulty. The patient tolerated the procedure                            well. Scope In: 12:48:48 PM Scope Out: 12:53:28 PM Total Procedure Duration: 0 hours 4 minutes 40 seconds  Findings:      A mild Schatzki ring was found at the gastroesophageal junction.       Non-obstructing. Esophagus otherwise appeared normal.      The entire examined stomach was normal.      The duodenal bulb and second portion of the duodenum were normal. Impression:               - Mild Schatzki ring -not manipulated (no dysphagia)                           - Normal stomach.                           - Normal duodenal bulb and second portion of the                            duodenum.                           - No specimens collected. Need to rule out occult                            gallbladder disease Moderate Sedation:      Moderate (conscious)  sedation was personally administered by an       anesthesia professional. The following parameters were monitored: oxygen       saturation, heart rate, blood pressure, respiratory rate, EKG, adequacy       of pulmonary ventilation, and response to care. Recommendation:           - Return patient to hospital ward for ongoing care.                            Right upper quadrant ultrasound. Continue PPI                            empirically.                           - Clear liquid diet. Procedure Code(s):        --- Professional ---                           [phone], Esophagogastroduodenoscopy, flexible,                            transoral; diagnostic, including collection of                            specimen(s) by brushing or washing,  when performed                            (separate procedure) Diagnosis Code(s):        --- Professional ---                           K22.2, Esophageal obstruction                           R10.13, Epigastric pain CPT copyright 2019 [employer]. All rights reserved. The codes documented in this report are preliminary and upon coder review may  be revised to meet current compliance requirements. Cristopher Estimable. Lauriana Denes, MD Norvel Richards, MD 11/20/2019 1:33:32 PM This report has been signed electronically. Number of Addenda: 0

## 2019-11-20 NOTE — Anesthesia Postprocedure Evaluation (Signed)
Anesthesia Post Note  Patient: [name]  Procedure(s) Performed: ESOPHAGOGASTRODUODENOSCOPY (EGD) WITH PROPOFOL (N/A )  Patient location during evaluation: PACU Anesthesia Type: General Level of consciousness: awake and patient cooperative Pain management: pain level controlled Vital Signs Assessment: post-procedure vital signs reviewed and stable Respiratory status: spontaneous breathing, respiratory function stable and nonlabored ventilation Cardiovascular status: stable Postop Assessment: no apparent nausea or vomiting Anesthetic complications: no   No complications documented.   Last Vitals:  Vitals:   11/20/19 1218 11/20/19 1300  BP: (!) 176/80 134/70  Pulse: 64 (!) 57  Resp: 13 (!) 22  Temp: 36.8 C   SpO2: 95% 100%    Last Pain:  Vitals:   11/20/19 1244  TempSrc:   PainSc: 0-No pain                 Anneka Studer

## 2019-11-20 NOTE — Transfer of Care (Signed)
Immediate Anesthesia Transfer of Care Note  Patient: KATHEE TUMLIN  Procedure(s) Performed: ESOPHAGOGASTRODUODENOSCOPY (EGD) WITH PROPOFOL (N/A )  Patient Location: PACU  Anesthesia Type:General  Level of Consciousness: awake, drowsy and patient cooperative  Airway & Oxygen Therapy: Patient Spontanous Breathing  Post-op Assessment: Report given to RN, Post -op Vital signs reviewed and stable and Patient moving all extremities X 4  Post vital signs: Reviewed and stable  Last Vitals:  Vitals Value Taken Time  BP 134/70 11/20/19 1300  Temp    Pulse 58 11/20/19 1301  Resp 20 11/20/19 1301  SpO2 99 % 11/20/19 1301  Vitals shown include unvalidated device data.  Last Pain:  Vitals:   11/20/19 1244  TempSrc:   PainSc: 0-No pain      Patients Stated Pain Goal: 3 (70/96/43 8381)  Complications: No complications documented.

## 2019-11-20 NOTE — Addendum Note (Signed)
Addendum  created 11/20/19 1306 by Jonna Munro, CRNA   Flowsheet accepted, Intraprocedure Flowsheets edited, Intraprocedure Staff edited

## 2019-11-20 NOTE — Progress Notes (Addendum)
Brief exam: patient denies further chest pain. Some abdominal pain when she moves. She is NPO. Pain often happens after meals. No n/v. No SOB.   VSS. Cardiac exam with regular rhythm. Chest CTA bilaterally. Abd: mild epig/luq tenderness.   We will add on LFTs and lipase are Dr. Laural Golden yesterday.  EGD today with Dr. Gala Romney.  I have discussed the risks, alternatives, benefits with regards to but not limited to the risk of reaction to medication, bleeding, infection, perforation and the patient is agreeable to proceed. Written consent to be obtained.  PATIENT HAS IMPLANTABLE CARDIOVERTER DEFIBRILLATOR.  Laureen Ochs. Bernarda Caffey [facility] [phone] 11/4/20218:14 AM

## 2019-11-20 NOTE — Anesthesia Preprocedure Evaluation (Signed)
Anesthesia Evaluation  Patient identified by MRN, date of birth, ID band Patient awake    Reviewed: Allergy & Precautions, H&P , NPO status , Patient's Chart, lab work & pertinent test results, reviewed documented beta blocker date and time   Airway Mallampati: I  TM Distance: >3 FB Neck ROM: full    Dental no notable dental hx. (+) Edentulous Upper, Edentulous Lower   Pulmonary neg pulmonary ROS,    Pulmonary exam normal breath sounds clear to auscultation       Cardiovascular Exercise Tolerance: Good hypertension, + angina + CAD, + Past MI, + Cardiac Stents and + Peripheral Vascular Disease  + Cardiac Defibrillator  Rhythm:regular Rate:Normal     Neuro/Psych negative neurological ROS  negative psych ROS   GI/Hepatic Neg liver ROS, hiatal hernia, GERD  Medicated,  Endo/Other  diabetesHypothyroidism   Renal/GU CRFRenal disease  negative genitourinary   Musculoskeletal   Abdominal   Peds  Hematology  (+) Blood dyscrasia, anemia ,   Anesthesia Other Findings   Reproductive/Obstetrics negative OB ROS                             Anesthesia Physical Anesthesia Plan  ASA: III and emergent  Anesthesia Plan: General   Post-op Pain Management:    Induction:   PONV Risk Score and Plan:   Airway Management Planned:   Additional Equipment:   Intra-op Plan:   Post-operative Plan:   Informed Consent: I have reviewed the patients History and Physical, chart, labs and discussed the procedure including the risks, benefits and alternatives for the proposed anesthesia with the patient or authorized representative who has indicated his/her understanding and acceptance.     Dental Advisory Given  Plan Discussed with: CRNA  Anesthesia Plan Comments:         Anesthesia Quick Evaluation

## 2019-11-20 NOTE — Progress Notes (Signed)
PROGRESS NOTE  [name] GGE:[id number] DOB: February 27, 1931 DOA: 11/14/2019 PCP: [name], DO Brief History:  86 y.o.femalewith medical history significant ofatrial fibrillation, history of cardioversion, AICD placement, history of CAD, history of MI, chronic systolic heart failure with most recent EF measurement of 40 to 45%, history of near syncopal episode, orthostatic hypotension episodes, hypertension, anxiety, depression, osteoarthritis, cataracts, chronic back pain, stage III chronic kidney disease, history of DVT, diet controlled type 2 diabetes, diverticulosis, history of esophageal stricture, history of esophagitis, GERD, hiatal hernia, history of rectal bleeding, hyperlipidemia, macular degeneration, nephrolithiasis who is brought to the emergency department via EMS due toweakness and dizziness for the past 2 weeks. The patient thinks that she may have had some chest pain, but does not have any symptomatology at the time of admission. She denies pain radiation, diaphoresis, palpitations, orthopnea or pitting edema of the lower extremities. The patient also complains that she has been having abdominal pain with decreased appetite, early satiety, bloating sensation and change in bowel habits for the past 2 weeks. She denies nausea, vomiting, diarrhea, melena or hematochezia. No dysuria, frequency or hematuria. She denies trauma sore throat, rhinorrhea, productive cough, dyspnea, wheezing or hemoptysis.   Assessment/Plan: Abdominal pain -CT abdomen indicated questionable appendicitis -Seen by general surgery this was felt to be unlikely -no fever or leukocytosis -lipase 29 -She did describe having periods of diarrhea, but in the past 2 weeks was on the more constipated side -She was started on MiraLAX and Linzess with multiple bowel movements -She continues to have post prandial abdominal pain  -Since she is having frequent stools, MiraLAX and Linzess  have been discontinued -She has been started on Bentyl for possible IBS component -Her CT did describe several areas of arterial calcifications -11/2 CTA abd--main mesenteric arteries are patent -11/4--planning EGD -appears to have musculoskeletal component  Vertigo -She had similar episodes in the past, but symptoms appear to be worse over the past few weeks -She does have declined in systolic blood pressure from 170s to 140s on standing -MRI imaging not an option due to defibrillator -Started on meclizine and as needed benzos -Overall dizziness has improved  Hyponatremia -Suspect related to decreased p.o. intake and volume depletion -Improved with IVF  Acute on chronic renal failure--CKD 3B -baseline creatinine 1.2-1.3 -presented with serum creatinine 1.55 -improved with IVF  Paroxysmal atrial fibrillation -Anticoagulated with Eliquis--holding temporarily for EGD -Followed at the [facility] -Started on amiodarone recently  Chronic Systolic CHF -04/21/48 Echo --LVEF40 to45% with no left ventricular hypertrophy. She had global hypokinesis of the left ventricle. -Euvolemic on exam  Diabetes, diet controlled -Monitor blood sugars--acceptable  Hypertension -Resume home doses of hydralazine -Blood pressure currently stable -Use IV hydralazine as needed for high blood pressures  Hypothyroidism. -Resume home dose of Synthroid  Coronary artery disease/Chest Pain -No complaints of chest pain -Continue statin, Imdur -11/3--personally reviewed EKG--sinus, nonspecific T wave changes -check troponins 8>>9 -cardiac catheterization 2015 that did not show any significant coronary stenoses. -CP seems GI related according to pt's description -somewhat reproducible on palpation -discussed with cardiology--no need for further inpatient work up unless CP becomes more typical and recurrent -no chest today  Polymorphic VT -no Vtach since admission -continue  amiodarone  Advance care planning -Discussed CODE STATUS with patient -She agrees to DNR -[name] was present for the conversation  Generalized weakness -Seen by physical therapy with recommendationsfor home health physical therapy  Abnormal CTA abd -  noted to have focal dilatation in the infrarenal abdominal aorta with a focal dissection or penetrating ulcer -CT reviewed with vascular surgery Dr. Scot Dock -Recommendations were that CT findings indicate a very small area which is chronic and unimpressive -considering advanced age and unimpressive findings on imaging, no further work up or intervention was recommended at this time.    Status is: Inpatient  Remains inpatient appropriate because:IV treatments appropriate due to intensity of illness or inability to take PO   Dispo: The patient is from: Home  Anticipated d/c is to: Home  Anticipated d/c date is: 1 day  Patient currently is not medically stable to d/c.        Family Communication:   daughter in law updated at bedside 11/4  Consultants:  GI  Code Status: DNR  DVT Prophylaxis:  apixaban on hold   Procedures: As Listed in Progress Note Above  Antibiotics: None     Subjective: Patient continues to complain of post-prandial upper abd pain.  No n/v/d, cp, sob, f/c, headache.  Objective: Vitals:   11/19/19 1359 11/19/19 2108 11/20/19 0507 11/20/19 0843  BP: 130/67 (!) 141/72 138/86 (!) 150/73  Pulse: 66 65 71 67  Resp: 18 17 16 17   Temp: 98.2 F (36.8 C) (!) 97.5 F (36.4 C) 98 F (36.7 C) 98.1 F (36.7 C)  TempSrc: Oral Oral Oral Oral  SpO2: 98% 98% 100% 100%  Weight:      Height:        Intake/Output Summary (Last 24 hours) at 11/20/2019 1158 Last data filed at 11/20/2019 0848 Gross per 24 hour  Intake 425.76 ml  Output 1200 ml  Net -774.24 ml   Weight change:  Exam:   General:  Pt is alert, follows commands  appropriately, not in acute distress  HEENT: No icterus, No thrush, No neck mass, [place]/AT  Cardiovascular: RRR, S1/S2, no rubs, no gallops  Respiratory: CTA bilaterally, no wheezing, no crackles, no rhonchi  Abdomen: Soft/+BS, LUQ tender, non distended, no guarding  Extremities: No edema, No lymphangitis, No petechiae, No rashes, no synovitis   Data Reviewed: I have personally reviewed following labs and imaging studies Basic Metabolic Panel: Recent Labs  Lab 11/14/19 2156 11/15/19 0422 11/16/19 0605 11/17/19 0421 11/18/19 0550 11/19/19 0438 11/20/19 0435  NA 133*   < > 135 138 137 135 136  K 3.8   < > 4.3 3.7 3.0* 4.7 3.5  CL 97*   < > 102 109 107 107 110  CO2 26   < > 25 21* 22 21* 19*  GLUCOSE 121*   < > 107* 90 110* 97 94  BUN 23   < > 19 13 13 18 21   CREATININE 1.55*   < > 1.47* 1.20* 1.11* 1.28* 1.29*  CALCIUM 9.4   < > 8.6* 8.6* 8.6* 9.0 8.7*  MG 2.2  --   --   --   --  1.8 1.8   < > = values in this interval not displayed.   Liver Function Tests: Recent Labs  Lab 11/14/19 2156 11/15/19 0422 11/17/19 0421 11/20/19 0435  AST 41 38 33 40  ALT 33 31 27 31   ALKPHOS 72 67 56 55  BILITOT 0.7 0.9 0.9 0.7  PROT 7.1 7.3 5.9* 5.8*  ALBUMIN 3.9 4.0 3.2* 3.1*   Recent Labs  Lab 11/20/19 0435  LIPASE 29   No results for input(s): AMMONIA in the last 168 hours. Coagulation Profile: Recent Labs  Lab 11/14/19 2156  INR  1.3*   CBC: Recent Labs  Lab 11/14/19 2156 11/15/19 0422 11/17/19 0421 11/18/19 0550  WBC 5.5 6.2 4.8 5.6  NEUTROABS 3.4 3.1  --   --   HGB 12.8 12.4 11.3* 11.3*  HCT 39.7 38.2 34.6* 35.2*  MCV 96.8 96.2 98.3 97.8  PLT 249 242 203 233   Cardiac Enzymes: No results for input(s): CKTOTAL, CKMB, CKMBINDEX, TROPONINI in the last 168 hours. BNP: Invalid input(s): POCBNP CBG: Recent Labs  Lab 11/19/19 0712 11/19/19 1321 11/19/19 1631 11/20/19 0904 11/20/19 1119  GLUCAP 89 105* 140* 76 84   HbA1C: No results for input(s): HGBA1C  in the last 72 hours. Urine analysis:    Component Value Date/Time   COLORURINE STRAW (A) 11/14/2019 2145   APPEARANCEUR CLEAR 11/14/2019 2145   APPEARANCEUR Clear 08/15/2019 0856   LABSPEC 1.003 (L) 11/14/2019 2145   PHURINE 8.0 11/14/2019 2145   GLUCOSEU NEGATIVE 11/14/2019 2145   HGBUR MODERATE (A) 11/14/2019 2145   BILIRUBINUR NEGATIVE 11/14/2019 2145   BILIRUBINUR Negative 08/15/2019 0856   KETONESUR NEGATIVE 11/14/2019 2145   PROTEINUR NEGATIVE 11/14/2019 2145   UROBILINOGEN negative 03/15/2015 1352   UROBILINOGEN 0.2 07/25/2011 2213   NITRITE NEGATIVE 11/14/2019 2145   LEUKOCYTESUR NEGATIVE 11/14/2019 2145   Sepsis Labs: @LABRCNTIP (procalcitonin:4,lacticidven:4) ) Recent Results (from the past 240 hour(s))  Respiratory Panel by RT PCR (Flu A&B, Covid) - Nasopharyngeal Swab     Status: None   Collection Time: 11/15/19  3:13 AM   Specimen: Nasopharyngeal Swab  Result Value Ref Range Status   SARS Coronavirus 2 by RT PCR NEGATIVE NEGATIVE Final    Comment: (NOTE) SARS-CoV-2 target nucleic acids are NOT DETECTED.  The SARS-CoV-2 RNA is generally detectable in upper respiratoy specimens during the acute phase of infection. The lowest concentration of SARS-CoV-2 viral copies this assay can detect is 131 copies/mL. A negative result does not preclude SARS-Cov-2 infection and should not be used as the sole basis for treatment or other patient management decisions. A negative result may occur with  improper specimen collection/handling, submission of specimen other than nasopharyngeal swab, presence of viral mutation(s) within the areas targeted by this assay, and inadequate number of viral copies (<131 copies/mL). A negative result must be combined with clinical observations, patient history, and epidemiological information. The expected result is Negative.  Fact Sheet for Patients:  PinkCheek.be  Fact Sheet for Healthcare Providers:   GravelBags.it  This test is no t yet approved or cleared by the [place] FDA and  has been authorized for detection and/or diagnosis of SARS-CoV-2 by FDA under an Emergency Use Authorization (EUA). This EUA will remain  in effect (meaning this test can be used) for the duration of the COVID-19 declaration under Section 564(b)(1) of the Act, 21 U.S.C. section 360bbb-3(b)(1), unless the authorization is terminated or revoked sooner.     Influenza A by PCR NEGATIVE NEGATIVE Final   Influenza B by PCR NEGATIVE NEGATIVE Final    Comment: (NOTE) The Xpert Xpress SARS-CoV-2/FLU/RSV assay is intended as an aid in  the diagnosis of influenza from Nasopharyngeal swab specimens and  should not be used as a sole basis for treatment. Nasal washings and  aspirates are unacceptable for Xpert Xpress SARS-CoV-2/FLU/RSV  testing.  Fact Sheet for Patients: PinkCheek.be  Fact Sheet for Healthcare Providers: GravelBags.it  This test is not yet approved or cleared by the [place] FDA and  has been authorized for detection and/or diagnosis of SARS-CoV-2 by  FDA under an Emergency Use  Authorization (EUA). This EUA will remain  in effect (meaning this test can be used) for the duration of the  Covid-19 declaration under Section 564(b)(1) of the Act, 21  U.S.C. section 360bbb-3(b)(1), unless the authorization is  terminated or revoked. Performed at [facility], [address], [place], [place] [address]      Scheduled Meds: . amiodarone  200 mg Oral Daily  . dicyclomine  10 mg Oral TID AC  . hydrALAZINE  10 mg Oral Q8H  . hydrocortisone  25 mg Rectal BID  . isosorbide mononitrate  15 mg Oral Daily  . levothyroxine  75 mcg Oral QAC breakfast  . meclizine  25 mg Oral TID  . pantoprazole  40 mg Oral Daily  . PARoxetine  20 mg Oral Daily  . rosuvastatin  20 mg Oral Daily   Continuous  Infusions:  Procedures/Studies: DG Chest Portable 1 View  Result Date: 11/14/2019 CLINICAL DATA:  Chest pain EXAM: PORTABLE CHEST 1 VIEW COMPARISON:  Radiograph 07/24/2019 FINDINGS: Cardiac pacer/defibrillator device is again seen with leads in stable position towards the cardiac apex with additional lead seen projecting over the left upper quadrant. Some chronically coarsened interstitial changes and hyperexpansion are similar to prior. No consolidation, features of edema, pneumothorax, or effusion. Stable cardiomediastinal contours with a calcified aorta. The osseous structures appear diffusely demineralized which may limit detection of small or nondisplaced fractures. No acute osseous or soft tissue abnormality. IMPRESSION: 1. No acute cardiopulmonary abnormality. 2. Stable chronically coarsened interstitial changes and hyperexpansion. Electronically Signed   By: [name] M.D.   On: 11/14/2019 22:33   CT Renal Stone Study  Result Date: 11/15/2019 CLINICAL DATA:  Lower abdominal pain with dizziness and hematuria. EXAM: CT ABDOMEN AND PELVIS WITHOUT CONTRAST TECHNIQUE: Multidetector CT imaging of the abdomen and pelvis was performed following the standard protocol without IV contrast. COMPARISON:  July 25, 2019 FINDINGS: Lower chest: No acute abnormality. Hepatobiliary: No focal liver abnormality is seen. No gallstones, gallbladder wall thickening, or biliary dilatation. Pancreas: Unremarkable. No pancreatic ductal dilatation or surrounding inflammatory changes. Spleen: Normal in size without focal abnormality. Adrenals/Urinary Tract: Adrenal glands are unremarkable. The left kidney is asymmetrically small when compared to a normal size right kidney. A 1.4 cm x 0.8 cm area of low attenuation is seen within the anterior aspect of the lower pole of the right kidney. Numerous a subcentimeter non-obstructing renal stones of various sizes are again seen throughout both kidneys very mild bilateral  hydronephrosis and hydroureter are seen without evidence of obstructing renal calculi. Bladder is unremarkable. Stomach/Bowel: There is a small hiatal hernia. Very mild appendiceal wall thickening is noted. No significant periappendiceal inflammatory fat stranding is seen. No evidence of bowel wall thickening, distention, or inflammatory changes. Vascular/Lymphatic: There is moderate to marked severity calcification and tortuosity of the abdominal aorta and bilateral common iliac arteries, without evidence of aneurysmal dilatation. No enlarged abdominal or pelvic lymph nodes. Reproductive: Uterus and bilateral adnexa are unremarkable. Other: No abdominal wall hernia or abnormality. No abdominopelvic ascites. Musculoskeletal: There is moderate to marked severity levoscoliosis of the lumbar spine with marked severity degenerative changes seen throughout the lumbar spine. IMPRESSION: 1. Mild appendiceal wall thickening, without periappendiceal inflammatory fat stranding. Early appendicitis cannot completely be excluded. 2. Numerous bilateral subcentimeter non-obstructing renal stones. 3. Small hiatal hernia. 4. Moderate to marked severity levoscoliosis of the lumbar spine with marked severity degenerative changes throughout the lumbar spine. 5. Aortic atherosclerosis. Aortic Atherosclerosis (ICD10-I70.0). Electronically Signed   By: Hoover Browns  Houston M.D.   On: 11/15/2019 01:50   CUP PACEART REMOTE DEVICE CHECK  Result Date: 10/23/2019 Scheduled remote reviewed. Normal device function.  Next remote 91 days.  CT Angio Abd/Pel w/ and/or w/o  Result Date: 11/18/2019 CLINICAL DATA:  Abdominal pain with decreased appetite. Evaluate for chronic mesenteric ischemia. EXAM: CTA ABDOMEN AND PELVIS  WITH CONTRAST TECHNIQUE: Multidetector CT imaging of the abdomen and pelvis was performed using the standard protocol during bolus administration of intravenous contrast. Multiplanar reconstructed images and MIPs were obtained  and reviewed to evaluate the vascular anatomy. CONTRAST:  55mL OMNIPAQUE IOHEXOL 350 MG/ML SOLN COMPARISON:  CT renal stone protocol 11/15/2019 and 01/11/2019 FINDINGS: VASCULAR Aorta: Diffuse atherosclerotic disease involving the abdominal aorta. Abdominal aorta is tortuous and related to the levoscoliosis. Again noted is a focal dilatation in the infrarenal aorta just beyond the takeoff of the IMA. This focal dilatation measures up to 2.4 cm and not significantly changed since 03/13/2018. In addition, there is an intimal flap along the left side of the abdominal aorta at this area of dilatation. This is unchanged from the exam in 2020 and could represent a focal dissection versus penetrating ulcer. No significant aortic stenosis. Celiac: Patent without evidence of aneurysm, dissection, vasculitis or significant stenosis. SMA: Mild narrowing at the origin of the SMA. The stenosis does not appear to be hemodynamically significant. Main SMA branches are widely patent. No evidence for aneurysm or dissection. Renals: Calcified plaque involving the origin of the left renal artery and findings are suggestive for high-grade stenosis and near occlusion of the left renal artery. Right renal artery is widely patent at the origin but has a beaded appearance, best seen on the coronal reformats, sequence 7, image 87. Findings are compatible with fibromuscular dysplasia and there was a similar finding on the previous examination. IMA: Patent Inflow: Bilateral common and external iliac arteries are widely patent without significant stenosis. Bilateral internal iliac arteries are patent with narrowing involving the left internal iliac artery. Proximal Outflow: Proximal femoral arteries are patent bilaterally. Veins: Portal venous system is patent. No gross abnormality to the IVC or iliac veins. Hepatic veins are patent. Review of the MIP images confirms the above findings. NON-VASCULAR Lower chest: Defibrillator leads extending  into the right heart. Peripheral 5 mm density in the right lower lobe on sequence 11, image 17 has minimally changed since 2013 and suggestive for a benign nodule. Motion artifact at the lung bases. No significant pleural effusions. Hepatobiliary: Normal appearance of the liver, gallbladder and portal venous system. No biliary dilatation Pancreas: Round low-density structure at the pancreatic neck region measures up to 8 mm on sequence 4, image 66. This is suggestive for small cystic lesion and unchanged since 2020. No significant pancreatic dilatation. No pancreatic inflammation or stranding. Spleen: Normal in size without focal abnormality. Adrenals/Urinary Tract: Adrenal glands are within normal limits. Left kidney is atrophy and likely associated with the high-grade left renal artery stenosis. Large cortical calcification involving the left kidney lower pole. Scattered calcifications in both kidneys. 7 mm stone in the right kidney lower pole. 8 mm stone in the right kidney interpolar region. Small right renal cysts. No suspicious renal lesions. No hydronephrosis. Urinary bladder is unremarkable. Stomach/Bowel: Mild wall thickening in the distal stomach is nonspecific. No inflammatory changes around the duodenum or stomach. Fluid-filled colon. No focal bowel inflammation and no evidence for bowel obstruction. Appendix contains fluid but no surrounding inflammatory changes. No significant small bowel dilatation. Lymphatic: No abdominopelvic lymphadenopathy. Reproductive: Uterus  and bilateral adnexa are unremarkable. Other: Negative for ascites.  Negative for free air. Musculoskeletal: Again noted are multiple cardiac leads in the anterior abdominal subcutaneous tissues. Levoscoliosis of the lumbar spine. Stable old compression deformity involving the T12 vertebral body. Multilevel degenerative changes in the lumbar spine. IMPRESSION: VASCULAR 1. No evidence for chronic mesenteric ischemia. The main mesenteric  arteries are patent without significant stenosis. 2. Stable appearance of the abdominal aorta with focal dilatation in the infrarenal abdominal aorta associated with a focal dissection or penetrating ulcer. 3. Fibromuscular dysplasia involving the right renal artery. 4. Critical stenosis involving the left renal artery with associated left renal atrophy. NON-VASCULAR 1. No acute abnormality abdomen or pelvis. 2. Stable 8 mm low-density structure in the pancreatic head/neck region. This probably represents a small cystic lesion and could represent an IPMN. Recommend follow up pre and post contrast MRI/MRCP or pancreatic protocol CT in 2 years. This recommendation follows ACR consensus guidelines: Management of Incidental Pancreatic Cysts: A White Paper of the ACR Incidental Findings Committee. J Am Coll Radiol 2820;[id number]. 3. Bilateral renal calculi.  No hydronephrosis. Electronically Signed   By: Markus Daft M.D.   On: 11/18/2019 10:46    Orson Eva, DO  [facility]  If 7PM-7AM, please contact night-coverage [email or web address] Password [email or web address] 11/20/2019, 11:58 AM   LOS: 5 days

## 2019-11-21 ENCOUNTER — Encounter (HOSPITAL_COMMUNITY): Payer: Self-pay | Admitting: Internal Medicine

## 2019-11-21 ENCOUNTER — Telehealth: Payer: Self-pay | Admitting: Nurse Practitioner

## 2019-11-21 ENCOUNTER — Inpatient Hospital Stay (HOSPITAL_COMMUNITY): Payer: Medicare Other

## 2019-11-21 DIAGNOSIS — R1011 Right upper quadrant pain: Secondary | ICD-10-CM

## 2019-11-21 DIAGNOSIS — I10 Essential (primary) hypertension: Secondary | ICD-10-CM

## 2019-11-21 DIAGNOSIS — R531 Weakness: Secondary | ICD-10-CM

## 2019-11-21 DIAGNOSIS — R0789 Other chest pain: Secondary | ICD-10-CM | POA: Diagnosis not present

## 2019-11-21 DIAGNOSIS — E871 Hypo-osmolality and hyponatremia: Secondary | ICD-10-CM | POA: Diagnosis not present

## 2019-11-21 DIAGNOSIS — R1010 Upper abdominal pain, unspecified: Secondary | ICD-10-CM | POA: Diagnosis not present

## 2019-11-21 DIAGNOSIS — R935 Abnormal findings on diagnostic imaging of other abdominal regions, including retroperitoneum: Secondary | ICD-10-CM | POA: Diagnosis not present

## 2019-11-21 DIAGNOSIS — R1084 Generalized abdominal pain: Secondary | ICD-10-CM

## 2019-11-21 DIAGNOSIS — K219 Gastro-esophageal reflux disease without esophagitis: Secondary | ICD-10-CM

## 2019-11-21 LAB — GLUCOSE, CAPILLARY
Glucose-Capillary: 99 mg/dL (ref 70–99)
Glucose-Capillary: 89 mg/dL (ref 70–99)
Glucose-Capillary: 116 mg/dL — ABNORMAL HIGH (ref 70–99)
Glucose-Capillary: 119 mg/dL — ABNORMAL HIGH (ref 70–99)

## 2019-11-21 MED ORDER — AMITRIPTYLINE HCL 10 MG PO TABS
10.0000 mg | ORAL_TABLET | Freq: Every day | ORAL | Status: DC
  Administered 2019-11-21: 10 mg via ORAL
  Filled 2019-11-21: qty 1

## 2019-11-21 MED ORDER — POLYETHYLENE GLYCOL 3350 17 G PO PACK
17.0000 g | PACK | Freq: Two times a day (BID) | ORAL | Status: DC
  Administered 2019-11-22: 17 g via ORAL
  Filled 2019-11-21: qty 1

## 2019-11-21 NOTE — Progress Notes (Signed)
Subjective: Today she states she still with abdominal pain, starts in the left side and when it starts getting bad it radiates to the general abdomen. Denies N/V, last bowel movement was yesterday and small, soft ([place] 5-6). Denies any other GI complaints.  Objective: Vital signs in last 24 hours: Temp:  [97.5 F (36.4 C)-98.2 F (36.8 C)] 98.2 F (36.8 C) (11/05 0411) Pulse Rate:  [57-70] 70 (11/05 0411) Resp:  [12-22] 16 (11/05 0411) BP: (134-182)/(67-84) 176/74 (11/05 0411) SpO2:  [94 %-100 %] 98 % (11/05 0411) Last BM Date: 11/20/19 General:   Alert and oriented, pleasant Head:  Normocephalic and atraumatic. Eyes:  No icterus, sclera clear. Conjuctiva pink.  Heart:  S1, S2 present, no murmurs noted.  Lungs: Clear to auscultation bilaterally, without wheezing, rales, or rhonchi.  Abdomen:  Bowel sounds present, soft, non-distended. Moderate TTP left-sided abdomen. No HSM or hernias noted. No rebound or guarding. No masses appreciated  Msk:  Symmetrical without gross deformities. Pulses:  Normal bilateral DP pulses noted. Extremities:  Without clubbing or edema. Neurologic:  Alert and  oriented x4;  grossly normal neurologically. Psych:  Alert and cooperative. Normal mood and affect.  Intake/Output from previous day: 11/04 0701 - 11/05 0700 In: 460 [P.O.:360; I.V.:100] Out: 900 [Urine:900] Intake/Output this shift: No intake/output data recorded.  Lab Results: No results for input(s): WBC, HGB, HCT, PLT in the last 72 hours. BMET Recent Labs    11/19/19 0438 11/20/19 0435  NA 135 136  K 4.7 3.5  CL 107 110  CO2 21* 19*  GLUCOSE 97 94  BUN 18 21  CREATININE 1.28* 1.29*  CALCIUM 9.0 8.7*   LFT Recent Labs    11/20/19 0435  PROT 5.8*  ALBUMIN 3.1*  AST 40  ALT 31  ALKPHOS 55  BILITOT 0.7  BILIDIR 0.1  IBILI 0.6   PT/INR No results for input(s): LABPROT, INR in the last 72 hours. Hepatitis Panel No results for input(s): HEPBSAG, HCVAB, HEPAIGM,  HEPBIGM in the last 72 hours.   Studies/Results: ECHOCARDIOGRAM COMPLETE  Result Date: 11/20/2019    ECHOCARDIOGRAM REPORT   Patient Name:   [name] Date of Exam: 11/20/2019 Medical Rec #:  [id number]        Height:       63.0 in Accession #:    [id number]       Weight:       139.3 lb Date of Birth:  30-Dec-1931        BSA:          1.658 m Patient Age:    86 years         BP:           189/78 mmHg Patient Gender: F                HR:           60 bpm. Exam Location:  [facility] Procedure: 2D Echo, Cardiac Doppler and Color Doppler Indications:    Chest Pain 786.50 / R07.9  History:        Patient has prior history of Echocardiogram examinations, most                 recent 01/21/2019. CAD and Previous Myocardial Infarction,                 Arrythmias:Atrial Fibrillation; Risk Factors:Hypertension,                 Diabetes  and Dyslipidemia. Chronic ischemic heart disease, Hx of                 COVID-19 virus infection, ICD (implantable                 cardioverter-defibrillator) in place, Long term current use of                 anticoagulant.  Sonographer:    Alvino Chapel RCS Referring Phys: [phone] DAVID TAT IMPRESSIONS  1. Left ventricular ejection fraction, by estimation, is 50 to 55%. The left ventricle has normal function. The left ventricle has no regional wall motion abnormalities. There is mild left ventricular hypertrophy. Left ventricular diastolic parameters are consistent with Grade I diastolic dysfunction (impaired relaxation).  2. Right ventricular systolic function is normal. The right ventricular size is normal. There is mildly elevated pulmonary artery systolic pressure.  3. Left atrial size was severely dilated.  4. Right atrial size was mildly dilated.  5. The mitral valve is normal in structure. Trivial mitral valve regurgitation. No evidence of mitral stenosis.  6. Tricuspid valve regurgitation is mild to moderate.  7. The aortic valve is tricuspid. Aortic valve regurgitation is mild.   8. The inferior vena cava is normal in size with greater than 50% respiratory variability, suggesting right atrial pressure of 3 mmHg. FINDINGS  Left Ventricle: Left ventricular ejection fraction, by estimation, is 50 to 55%. The left ventricle has normal function. The left ventricle has no regional wall motion abnormalities. The left ventricular internal cavity size was normal in size. There is  mild left ventricular hypertrophy. Left ventricular diastolic parameters are consistent with Grade I diastolic dysfunction (impaired relaxation). Normal left ventricular filling pressure. Right Ventricle: The right ventricular size is normal. No increase in right ventricular wall thickness. Right ventricular systolic function is normal. There is mildly elevated pulmonary artery systolic pressure. The tricuspid regurgitant velocity is 2.96  m/s, and with an assumed right atrial pressure of 3 mmHg, the estimated right ventricular systolic pressure is 93.9 mmHg. Left Atrium: Left atrial size was severely dilated. Right Atrium: Right atrial size was mildly dilated. Pericardium: There is no evidence of pericardial effusion. Mitral Valve: The mitral valve is normal in structure. There is mild thickening of the mitral valve leaflet(s). There is mild calcification of the mitral valve leaflet(s). Mild mitral annular calcification. Trivial mitral valve regurgitation. No evidence  of mitral valve stenosis. Tricuspid Valve: The tricuspid valve is normal in structure. Tricuspid valve regurgitation is mild to moderate. No evidence of tricuspid stenosis. Aortic Valve: The aortic valve is tricuspid. Aortic valve regurgitation is mild. Aortic regurgitation PHT measures 741 msec. Aortic valve mean gradient measures 2.6 mmHg. Aortic valve peak gradient measures 5.7 mmHg. Aortic valve area, by VTI measures 3.52 cm. Pulmonic Valve: The pulmonic valve was not well visualized. Pulmonic valve regurgitation is mild. No evidence of pulmonic  stenosis. Aorta: The aortic root is normal in size and structure. Pulmonary Artery: Mild pulmonary HTN, PASP is 38 mmHg. Venous: The inferior vena cava is normal in size with greater than 50% respiratory variability, suggesting right atrial pressure of 3 mmHg. IAS/Shunts: No atrial level shunt detected by color flow Doppler.  LEFT VENTRICLE PLAX 2D LVIDd:         4.29 cm     Diastology LVIDs:         3.45 cm     LV e' medial:    4.03 cm/s LV PW:  1.12 cm     LV E/e' medial:  13.0 LV IVS:        1.11 cm     LV e' lateral:   5.22 cm/s LVOT diam:     2.30 cm     LV E/e' lateral: 10.0 LV SV:         84 LV SV Index:   51 LVOT Area:     4.15 cm  LV Volumes (MOD) LV vol d, MOD A2C: 76.5 ml LV vol d, MOD A4C: 82.6 ml LV vol s, MOD A2C: 37.6 ml LV vol s, MOD A4C: 29.2 ml LV SV MOD A2C:     38.9 ml LV SV MOD A4C:     82.6 ml LV SV MOD BP:      47.6 ml RIGHT VENTRICLE RV S prime:     9.57 cm/s TAPSE (M-mode): 2.0 cm LEFT ATRIUM             Index       RIGHT ATRIUM           Index LA diam:        3.60 cm 2.17 cm/m  RA Area:     14.20 cm LA Vol (A2C):   77.2 ml 46.55 ml/m RA Volume:   33.50 ml  20.20 ml/m LA Vol (A4C):   72.4 ml 43.66 ml/m LA Biplane Vol: 74.7 ml 45.05 ml/m  AORTIC VALVE AV Area (Vmax):    3.21 cm AV Area (Vmean):   3.36 cm AV Area (VTI):     3.52 cm AV Vmax:           119.01 cm/s AV Vmean:          73.150 cm/s AV VTI:            0.240 m AV Peak Grad:      5.7 mmHg AV Mean Grad:      2.6 mmHg LVOT Vmax:         91.90 cm/s LVOT Vmean:        59.100 cm/s LVOT VTI:          0.203 m LVOT/AV VTI ratio: 0.85 AI PHT:            741 msec  AORTA Ao Root diam: 3.40 cm MITRAL VALVE               TRICUSPID VALVE MV Area (PHT): 3.72 cm    TR Peak grad:   35.0 mmHg MV Decel Time: 204 msec    TR Vmax:        296.00 cm/s MV E velocity: 52.40 cm/s MV A velocity: 84.00 cm/s  SHUNTS MV E/A ratio:  0.62        Systemic VTI:  0.20 m                            Systemic Diam: 2.30 cm Carlyle Dolly MD Electronically  signed by Carlyle Dolly MD Signature Date/Time: 11/20/2019/4:00:05 PM    Final    US Abdomen Limited RUQ (LIVER/GB)  Result Date: 11/21/2019 CLINICAL DATA:  RIGHT upper quadrant abdominal pain EXAM: ULTRASOUND ABDOMEN LIMITED RIGHT UPPER QUADRANT COMPARISON:  CT abdomen and pelvis 11/18/2019 FINDINGS: Gallbladder: Normally distended without stones or wall thickening. No pericholecystic fluid or sonographic Murphy sign. Common bile duct: Diameter: 4 mm, normal Liver: Coarsened mildly increased hepatic echogenicity likely accentuated by body habitus, likely representing fatty infiltration. No definite hepatic mass or  nodularity identified. Portal vein is patent on color Doppler imaging with normal direction of blood flow towards the liver. Other: No RIGHT upper quadrant free fluid. Marked cortical thinning and increased cortical echogenicity of the RIGHT kidney. IMPRESSION: Probable fatty infiltration of liver as above. RIGHT renal cortical atrophy. Electronically Signed   By: Lavonia Dana M.D.   On: 11/21/2019 10:21    Assessment: Abdominal and chest pain: appears to be of GI origin. Pain generally is triggered by meals but she had pain around noon today while she was fasting. She could be refluxing. Troponin levels are normal and EKG did not show any changes of injury or ischemia. She could also have gastroparesis but first need to rule out peptic ulcer disease or pyloric stenosis. EGD completed yesterday and was essentially normal. RUQ U/S ordered to r/o biliary etiology and found probable fatty liver, right renal cortical atrophy, no other acute findings.  Specifically CBD normal at 4 mm, no cholelithiasis or wall thickening.  Today She is still having abdominal pain, was just given Bentyl. No N/V. Clinically stable. Wants to stay local for GI care (previously saw Dr. Henrene Pastor in 2018; first consult in [facility] was Roseanne Kaufman, NP and Dr. Abbey Chatters)  Overall felt to be functional in etiology. Discussion with Dr.  Julio Sicks who has spoken with Dr. Carles Collet. Planned Amitriptyline and Bentyl with outpatient follow-up.  Anemia: She has mild anemia. No evidence of overt GI bleed. EGD normal yesterday  Plan: 1. Bentyl for functional pain 2. Can consider adding Amitriptyline as well. 3. Supportive measures 4. Anticipate d/c in the next 24-48 hours if tolerating a diet 5. Will scheduled outpatient follow-up with local [facility] 6. We will sign off for now. Please contact us if we can be of further assistance   Thank you for allowing [place] to participate in the care of Renee Corona, DNP, AGNP-C Adult & Gerontological Nurse Practitioner [facility]    LOS: 6 days    11/21/2019, 12:57 PM

## 2019-11-21 NOTE — Telephone Encounter (Signed)
Please schedule outpatient follow-up in 6-8 weeks (primary MD Dr. Abbey Chatters). Can see any APP or Dr. Abbey Chatters

## 2019-11-21 NOTE — Care Management Important Message (Signed)
Important Message  Patient Details  Name: [name] MRN: [id number] Date of Birth: 23-Aug-1931   Medicare Important Message Given:  Yes     Tommy Medal 11/21/2019, 4:06 PM

## 2019-11-21 NOTE — Progress Notes (Signed)
[name] MVH:[id number] DOB: 24-Nov-1931 DOA: 11/14/2019 PCP: [name], DO   Brief History: 86 y.o.femalewith medical history significant ofatrial fibrillation, history of cardioversion, AICD placement, history of CAD, history of MI, chronic systolic heart failure with most recent EF measurement of 40 to 45%, history of near syncopal episode, orthostatic hypotension episodes, hypertension, anxiety, depression, osteoarthritis, cataracts, chronic back pain, stage III chronic kidney disease, history of DVT, diet controlled type 2 diabetes, diverticulosis, history of esophageal stricture, history of esophagitis, GERD, hiatal hernia, history of rectal bleeding, hyperlipidemia, macular degeneration, nephrolithiasis who is brought to the emergency department via EMS due toweakness and dizziness for the past 2 weeks. The patient thinks that she may have had some chest pain, but does not have any symptomatologyat the time of admission. She denies pain radiation, diaphoresis, palpitations, orthopnea or pitting edema of the lower extremities. The patient also complains that she has been having abdominal pain with decreased appetite, early satiety, bloating sensation and change in bowel habits for the past 2 weeks. She denies nausea, vomiting, diarrhea, melena or hematochezia. No dysuria, frequency or hematuria. She denies trauma sore throat, rhinorrhea, productive cough, dyspnea, wheezing or hemoptysis.   Assessment/Plan: Abdominal pain -CT abdomen indicated questionable appendicitis -Seen by general surgery this was felt to be unlikely -no fever or leukocytosis -lipase 29 -She did describe having periods of diarrhea, but in the past 2 weeks was on the more constipated side -She was started on MiraLAX and Linzess with multiple bowel movements -She continues to have post prandial abdominal pain  -Since she is having frequent stools, MiraLAX and  Linzess have been discontinued -continue Bentyl for possible IBS component -Her CT did describe several areas of arterial calcifications -11/2 CTA abd--main mesenteric arteries are patent -11/4--EGD--mild Schatzki's ring, normal stomach and duodenum -appears to have musculoskeletal/ functional component -RUQ ultrasound--fatty liver; right renal atrophy, neg GB -11/5--discussed with GI, Dr. Melodie Bouillon to d/c when tolerates diet  Vertigo -She had similar episodes in the past, but symptoms appear to be worse over the past few weeks -She does have declined in systolic blood pressure from 170s to 140s on standing -MRI imaging not an option due to defibrillator -Started on meclizine and as needed benzos -Overall dizziness has improved  Hyponatremia -Suspect related to decreased p.o. intakeand volume depletion -Improved withIVF  Acute on chronic renal failure--CKD 3B -baseline creatinine 1.2-1.3 -presented with serum creatinine 1.55 -improved with IVF  Paroxysmal atrial fibrillation -Anticoagulated with Eliquis--holding temporarily for EGD -Followed at the [facility] clinic -Started on amiodarone recently  Chronic Systolic CHF -08/20/11 Echo --LVEF40 to45% with no left ventricular hypertrophy. She had global hypokinesis of the left ventricle. -Euvolemic on exam -11/4 Echo--EF 55-60%, G1DD, no WMA  Diabetes, diet controlled -Monitor blood sugars--acceptable  Hypertension -Resume home doses of hydralazine -Blood pressure currently stable -Use IV hydralazine as needed for high blood pressures  Hypothyroidism. -Resume home dose of Synthroid  Coronary artery disease/Chest Pain -No complaints of chest pain -Continue statin, Imdur -11/3--personally reviewed EKG--sinus, nonspecific T wave changes -check troponins 8>>9 -cardiac catheterization 2015 that did not show any significant coronary stenoses. -CP seems GI related according to pt's description -somewhat  reproducible on palpation -discussed with cardiology--no need for further inpatient work up unless CP becomes more typical and recurrent -no chest pain today  Polymorphic VT -no Vtach since admission -continue amiodarone  Advance care planning -Discussed CODE STATUS with patient -She agrees to  DNR -[name] was present for the conversation  Generalized weakness -Seen by physical therapy with recommendationsfor home health physical therapy  Abnormal CTA abd -noted to have focal dilatation in the infrarenal abdominal aorta with a focal dissection or penetrating ulcer -CT reviewed with vascular surgery Dr. Scot Dock -Recommendations were that CT findings indicate a very small area which is chronic and unimpressive -considering advanced age and unimpressive findings on imaging, no further work up or intervention was recommended at this time.    Status is: Inpatient  Remains inpatient appropriate because:IV treatments appropriate due to intensity of illness or inability to take PO   Dispo: The patient is from:Home Anticipated d/c is GB:TDVV Anticipated d/c date is: 1 day Patient currently is not medically stable to d/c.        Family Communication:daughter in law updatedat bedside 11/4  Consultants:GI  Code Status: DNR  DVT Prophylaxis:apixaban on hold   Procedures: As Listed in Renee Note Above  Antibiotics: None    Subjective: Patient complains of LUQ pain that radiates all over.  Denies f/c, cp, sob, n/v/d, dysuria  Objective: Vitals:   11/20/19 2023 11/20/19 2237 11/21/19 0411 11/21/19 1438  BP:  (!) 148/76 (!) 176/74 (!) 151/68  Pulse:  63 70 65  Resp:  16 16 17   Temp:  (!) 97.5 F (36.4 C) 98.2 F (36.8 C) 97.6 F (36.4 C)  TempSrc:  Oral Oral Oral  SpO2: 98% 98% 98% 98%  Weight:      Height:       No intake or output data in the 24 hours ending 11/21/19  1816 Weight change:  Exam:   General:  Pt is alert, follows commands appropriately, not in acute distress  HEENT: No icterus, No thrush, No neck mass, [place]/AT  Cardiovascular: RRR, S1/S2, no rubs, no gallops  Respiratory: CTA bilaterally, no wheezing, no crackles, no rhonchi  Abdomen: Soft/+BS, LUQ tender, non distended, no guarding  Extremities: No edema, No lymphangitis, No petechiae, No rashes, no synovitis   Data Reviewed: I have personally reviewed following labs and imaging studies Basic Metabolic Panel: Recent Labs  Lab 11/14/19 2156 11/15/19 0422 11/16/19 6160 11/17/19 0421 11/18/19 0550 11/19/19 0438 11/20/19 0435  NA 133*   < > 135 138 137 135 136  K 3.8   < > 4.3 3.7 3.0* 4.7 3.5  CL 97*   < > 102 109 107 107 110  CO2 26   < > 25 21* 22 21* 19*  GLUCOSE 121*   < > 107* 90 110* 97 94  BUN 23   < > 19 13 13 18 21   CREATININE 1.55*   < > 1.47* 1.20* 1.11* 1.28* 1.29*  CALCIUM 9.4   < > 8.6* 8.6* 8.6* 9.0 8.7*  MG 2.2  --   --   --   --  1.8 1.8   < > = values in this interval not displayed.   Liver Function Tests: Recent Labs  Lab 11/14/19 2156 11/15/19 0422 11/17/19 0421 11/20/19 0435  AST 41 38 33 40  ALT 33 31 27 31   ALKPHOS 72 67 56 55  BILITOT 0.7 0.9 0.9 0.7  PROT 7.1 7.3 5.9* 5.8*  ALBUMIN 3.9 4.0 3.2* 3.1*   Recent Labs  Lab 11/20/19 0435  LIPASE 29   No results for input(s): AMMONIA in the last 168 hours. Coagulation Profile: Recent Labs  Lab 11/14/19 2156  INR 1.3*   CBC: Recent Labs  Lab 11/14/19 2156 11/15/19 0422  11/17/19 0421 11/18/19 0550  WBC 5.5 6.2 4.8 5.6  NEUTROABS 3.4 3.1  --   --   HGB 12.8 12.4 11.3* 11.3*  HCT 39.7 38.2 34.6* 35.2*  MCV 96.8 96.2 98.3 97.8  PLT 249 242 203 233   Cardiac Enzymes: No results for input(s): CKTOTAL, CKMB, CKMBINDEX, TROPONINI in the last 168 hours. BNP: Invalid input(s): POCBNP CBG: Recent Labs  Lab 11/20/19 1822 11/21/19 0455 11/21/19 0750 11/21/19 1130  11/21/19 1625  GLUCAP 83 99 89 116* 119*   HbA1C: No results for input(s): HGBA1C in the last 72 hours. Urine analysis:    Component Value Date/Time   COLORURINE STRAW (A) 11/14/2019 2145   APPEARANCEUR CLEAR 11/14/2019 2145   APPEARANCEUR Clear 08/15/2019 0856   LABSPEC 1.003 (L) 11/14/2019 2145   PHURINE 8.0 11/14/2019 2145   GLUCOSEU NEGATIVE 11/14/2019 2145   HGBUR MODERATE (A) 11/14/2019 2145   BILIRUBINUR NEGATIVE 11/14/2019 2145   BILIRUBINUR Negative 08/15/2019 0856   KETONESUR NEGATIVE 11/14/2019 2145   PROTEINUR NEGATIVE 11/14/2019 2145   UROBILINOGEN negative 03/15/2015 1352   UROBILINOGEN 0.2 07/25/2011 2213   NITRITE NEGATIVE 11/14/2019 2145   LEUKOCYTESUR NEGATIVE 11/14/2019 2145   Sepsis Labs: @LABRCNTIP (procalcitonin:4,lacticidven:4) ) Recent Results (from the past 240 hour(s))  Respiratory Panel by RT PCR (Flu A&B, Covid) - Nasopharyngeal Swab     Status: None   Collection Time: 11/15/19  3:13 AM   Specimen: Nasopharyngeal Swab  Result Value Ref Range Status   SARS Coronavirus 2 by RT PCR NEGATIVE NEGATIVE Final    Comment: (NOTE) SARS-CoV-2 target nucleic acids are NOT DETECTED.  The SARS-CoV-2 RNA is generally detectable in upper respiratoy specimens during the acute phase of infection. The lowest concentration of SARS-CoV-2 viral copies this assay can detect is 131 copies/mL. A negative result does not preclude SARS-Cov-2 infection and should not be used as the sole basis for treatment or other patient management decisions. A negative result may occur with  improper specimen collection/handling, submission of specimen other than nasopharyngeal swab, presence of viral mutation(s) within the areas targeted by this assay, and inadequate number of viral copies (<131 copies/mL). A negative result must be combined with clinical observations, patient history, and epidemiological information. The expected result is Negative.  Fact Sheet for Patients:   PinkCheek.be  Fact Sheet for Healthcare Providers:  GravelBags.it  This test is no t yet approved or cleared by the [place] FDA and  has been authorized for detection and/or diagnosis of SARS-CoV-2 by FDA under an Emergency Use Authorization (EUA). This EUA will remain  in effect (meaning this test can be used) for the duration of the COVID-19 declaration under Section 564(b)(1) of the Act, 21 U.S.C. section 360bbb-3(b)(1), unless the authorization is terminated or revoked sooner.     Influenza A by PCR NEGATIVE NEGATIVE Final   Influenza B by PCR NEGATIVE NEGATIVE Final    Comment: (NOTE) The Xpert Xpress SARS-CoV-2/FLU/RSV assay is intended as an aid in  the diagnosis of influenza from Nasopharyngeal swab specimens and  should not be used as a sole basis for treatment. Nasal washings and  aspirates are unacceptable for Xpert Xpress SARS-CoV-2/FLU/RSV  testing.  Fact Sheet for Patients: PinkCheek.be  Fact Sheet for Healthcare Providers: GravelBags.it  This test is not yet approved or cleared by the [place] FDA and  has been authorized for detection and/or diagnosis of SARS-CoV-2 by  FDA under an Emergency Use Authorization (EUA). This EUA will remain  in effect (meaning this test  can be used) for the duration of the  Covid-19 declaration under Section 564(b)(1) of the Act, 21  U.S.C. section 360bbb-3(b)(1), unless the authorization is  terminated or revoked. Performed at [facility], [address], [place], [place] [address]      Scheduled Meds: . amiodarone  200 mg Oral Daily  . dicyclomine  10 mg Oral TID AC  . hydrALAZINE  10 mg Oral Q8H  . hydrocortisone  25 mg Rectal BID  . isosorbide mononitrate  15 mg Oral Daily  . levothyroxine  75 mcg Oral QAC breakfast  . meclizine  25 mg Oral TID  . pantoprazole  40 mg Oral Daily  . PARoxetine   20 mg Oral Daily  . rosuvastatin  20 mg Oral Daily   Continuous Infusions:  Procedures/Studies: DG Chest Portable 1 View  Result Date: 11/14/2019 CLINICAL DATA:  Chest pain EXAM: PORTABLE CHEST 1 VIEW COMPARISON:  Radiograph 07/24/2019 FINDINGS: Cardiac pacer/defibrillator device is again seen with leads in stable position towards the cardiac apex with additional lead seen projecting over the left upper quadrant. Some chronically coarsened interstitial changes and hyperexpansion are similar to prior. No consolidation, features of edema, pneumothorax, or effusion. Stable cardiomediastinal contours with a calcified aorta. The osseous structures appear diffusely demineralized which may limit detection of small or nondisplaced fractures. No acute osseous or soft tissue abnormality. IMPRESSION: 1. No acute cardiopulmonary abnormality. 2. Stable chronically coarsened interstitial changes and hyperexpansion. Electronically Signed   By: [name] M.D.   On: 11/14/2019 22:33   ECHOCARDIOGRAM COMPLETE  Result Date: 11/20/2019    ECHOCARDIOGRAM REPORT   Patient Name:   PAYDEN BONUS Date of Exam: 11/20/2019 Medical Rec #:  [id number]        Height:       63.0 in Accession #:    [id number]       Weight:       139.3 lb Date of Birth:  Dec 03, 1931        BSA:          1.658 m Patient Age:    39 years         BP:           189/78 mmHg Patient Gender: F                HR:           60 bpm. Exam Location:  [facility] Procedure: 2D Echo, Cardiac Doppler and Color Doppler Indications:    Chest Pain 786.50 / R07.9  History:        Patient has prior history of Echocardiogram examinations, most                 recent 01/21/2019. CAD and Previous Myocardial Infarction,                 Arrythmias:Atrial Fibrillation; Risk Factors:Hypertension,                 Diabetes and Dyslipidemia. Chronic ischemic heart disease, Hx of                 COVID-19 virus infection, ICD (implantable                 cardioverter-defibrillator)  in place, Long term current use of                 anticoagulant.  Sonographer:    Alvino Chapel RCS Referring Phys: [phone] Khristie Sak IMPRESSIONS  1. Left  ventricular ejection fraction, by estimation, is 50 to 55%. The left ventricle has normal function. The left ventricle has no regional wall motion abnormalities. There is mild left ventricular hypertrophy. Left ventricular diastolic parameters are consistent with Grade I diastolic dysfunction (impaired relaxation).  2. Right ventricular systolic function is normal. The right ventricular size is normal. There is mildly elevated pulmonary artery systolic pressure.  3. Left atrial size was severely dilated.  4. Right atrial size was mildly dilated.  5. The mitral valve is normal in structure. Trivial mitral valve regurgitation. No evidence of mitral stenosis.  6. Tricuspid valve regurgitation is mild to moderate.  7. The aortic valve is tricuspid. Aortic valve regurgitation is mild.  8. The inferior vena cava is normal in size with greater than 50% respiratory variability, suggesting right atrial pressure of 3 mmHg. FINDINGS  Left Ventricle: Left ventricular ejection fraction, by estimation, is 50 to 55%. The left ventricle has normal function. The left ventricle has no regional wall motion abnormalities. The left ventricular internal cavity size was normal in size. There is  mild left ventricular hypertrophy. Left ventricular diastolic parameters are consistent with Grade I diastolic dysfunction (impaired relaxation). Normal left ventricular filling pressure. Right Ventricle: The right ventricular size is normal. No increase in right ventricular wall thickness. Right ventricular systolic function is normal. There is mildly elevated pulmonary artery systolic pressure. The tricuspid regurgitant velocity is 2.96  m/s, and with an assumed right atrial pressure of 3 mmHg, the estimated right ventricular systolic pressure is 22.9 mmHg. Left Atrium: Left atrial size was  severely dilated. Right Atrium: Right atrial size was mildly dilated. Pericardium: There is no evidence of pericardial effusion. Mitral Valve: The mitral valve is normal in structure. There is mild thickening of the mitral valve leaflet(s). There is mild calcification of the mitral valve leaflet(s). Mild mitral annular calcification. Trivial mitral valve regurgitation. No evidence  of mitral valve stenosis. Tricuspid Valve: The tricuspid valve is normal in structure. Tricuspid valve regurgitation is mild to moderate. No evidence of tricuspid stenosis. Aortic Valve: The aortic valve is tricuspid. Aortic valve regurgitation is mild. Aortic regurgitation PHT measures 741 msec. Aortic valve mean gradient measures 2.6 mmHg. Aortic valve peak gradient measures 5.7 mmHg. Aortic valve area, by VTI measures 3.52 cm. Pulmonic Valve: The pulmonic valve was not well visualized. Pulmonic valve regurgitation is mild. No evidence of pulmonic stenosis. Aorta: The aortic root is normal in size and structure. Pulmonary Artery: Mild pulmonary HTN, PASP is 38 mmHg. Venous: The inferior vena cava is normal in size with greater than 50% respiratory variability, suggesting right atrial pressure of 3 mmHg. IAS/Shunts: No atrial level shunt detected by color flow Doppler.  LEFT VENTRICLE PLAX 2D LVIDd:         4.29 cm     Diastology LVIDs:         3.45 cm     LV e' medial:    4.03 cm/s LV PW:         1.12 cm     LV E/e' medial:  13.0 LV IVS:        1.11 cm     LV e' lateral:   5.22 cm/s LVOT diam:     2.30 cm     LV E/e' lateral: 10.0 LV SV:         84 LV SV Index:   51 LVOT Area:     4.15 cm  LV Volumes (MOD) LV vol d, MOD A2C: 76.5 ml  LV vol d, MOD A4C: 82.6 ml LV vol s, MOD A2C: 37.6 ml LV vol s, MOD A4C: 29.2 ml LV SV MOD A2C:     38.9 ml LV SV MOD A4C:     82.6 ml LV SV MOD BP:      47.6 ml RIGHT VENTRICLE RV S prime:     9.57 cm/s TAPSE (M-mode): 2.0 cm LEFT ATRIUM             Index       RIGHT ATRIUM           Index LA diam:         3.60 cm 2.17 cm/m  RA Area:     14.20 cm LA Vol (A2C):   77.2 ml 46.55 ml/m RA Volume:   33.50 ml  20.20 ml/m LA Vol (A4C):   72.4 ml 43.66 ml/m LA Biplane Vol: 74.7 ml 45.05 ml/m  AORTIC VALVE AV Area (Vmax):    3.21 cm AV Area (Vmean):   3.36 cm AV Area (VTI):     3.52 cm AV Vmax:           119.01 cm/s AV Vmean:          73.150 cm/s AV VTI:            0.240 m AV Peak Grad:      5.7 mmHg AV Mean Grad:      2.6 mmHg LVOT Vmax:         91.90 cm/s LVOT Vmean:        59.100 cm/s LVOT VTI:          0.203 m LVOT/AV VTI ratio: 0.85 AI PHT:            741 msec  AORTA Ao Root diam: 3.40 cm MITRAL VALVE               TRICUSPID VALVE MV Area (PHT): 3.72 cm    TR Peak grad:   35.0 mmHg MV Decel Time: 204 msec    TR Vmax:        296.00 cm/s MV E velocity: 52.40 cm/s MV A velocity: 84.00 cm/s  SHUNTS MV E/A ratio:  0.62        Systemic VTI:  0.20 m                            Systemic Diam: 2.30 cm Carlyle Dolly MD Electronically signed by Carlyle Dolly MD Signature Date/Time: 11/20/2019/4:00:05 PM    Final    CT Renal Stone Study  Result Date: 11/15/2019 CLINICAL DATA:  Lower abdominal pain with dizziness and hematuria. EXAM: CT ABDOMEN AND PELVIS WITHOUT CONTRAST TECHNIQUE: Multidetector CT imaging of the abdomen and pelvis was performed following the standard protocol without IV contrast. COMPARISON:  July 25, 2019 FINDINGS: Lower chest: No acute abnormality. Hepatobiliary: No focal liver abnormality is seen. No gallstones, gallbladder wall thickening, or biliary dilatation. Pancreas: Unremarkable. No pancreatic ductal dilatation or surrounding inflammatory changes. Spleen: Normal in size without focal abnormality. Adrenals/Urinary Tract: Adrenal glands are unremarkable. The left kidney is asymmetrically small when compared to a normal size right kidney. A 1.4 cm x 0.8 cm area of low attenuation is seen within the anterior aspect of the lower pole of the right kidney. Numerous a subcentimeter non-obstructing  renal stones of various sizes are again seen throughout both kidneys very mild bilateral hydronephrosis and hydroureter are seen without evidence of obstructing renal  calculi. Bladder is unremarkable. Stomach/Bowel: There is a small hiatal hernia. Very mild appendiceal wall thickening is noted. No significant periappendiceal inflammatory fat stranding is seen. No evidence of bowel wall thickening, distention, or inflammatory changes. Vascular/Lymphatic: There is moderate to marked severity calcification and tortuosity of the abdominal aorta and bilateral common iliac arteries, without evidence of aneurysmal dilatation. No enlarged abdominal or pelvic lymph nodes. Reproductive: Uterus and bilateral adnexa are unremarkable. Other: No abdominal wall hernia or abnormality. No abdominopelvic ascites. Musculoskeletal: There is moderate to marked severity levoscoliosis of the lumbar spine with marked severity degenerative changes seen throughout the lumbar spine. IMPRESSION: 1. Mild appendiceal wall thickening, without periappendiceal inflammatory fat stranding. Early appendicitis cannot completely be excluded. 2. Numerous bilateral subcentimeter non-obstructing renal stones. 3. Small hiatal hernia. 4. Moderate to marked severity levoscoliosis of the lumbar spine with marked severity degenerative changes throughout the lumbar spine. 5. Aortic atherosclerosis. Aortic Atherosclerosis (ICD10-I70.0). Electronically Signed   By: Virgina Norfolk M.D.   On: 11/15/2019 01:50   CT Angio Abd/Pel w/ and/or w/o  Result Date: 11/18/2019 CLINICAL DATA:  Abdominal pain with decreased appetite. Evaluate for chronic mesenteric ischemia. EXAM: CTA ABDOMEN AND PELVIS  WITH CONTRAST TECHNIQUE: Multidetector CT imaging of the abdomen and pelvis was performed using the standard protocol during bolus administration of intravenous contrast. Multiplanar reconstructed images and MIPs were obtained and reviewed to evaluate the vascular  anatomy. CONTRAST:  11mL OMNIPAQUE IOHEXOL 350 MG/ML SOLN COMPARISON:  CT renal stone protocol 11/15/2019 and 01/11/2019 FINDINGS: VASCULAR Aorta: Diffuse atherosclerotic disease involving the abdominal aorta. Abdominal aorta is tortuous and related to the levoscoliosis. Again noted is a focal dilatation in the infrarenal aorta just beyond the takeoff of the IMA. This focal dilatation measures up to 2.4 cm and not significantly changed since 03/13/2018. In addition, there is an intimal flap along the left side of the abdominal aorta at this area of dilatation. This is unchanged from the exam in 2020 and could represent a focal dissection versus penetrating ulcer. No significant aortic stenosis. Celiac: Patent without evidence of aneurysm, dissection, vasculitis or significant stenosis. SMA: Mild narrowing at the origin of the SMA. The stenosis does not appear to be hemodynamically significant. Main SMA branches are widely patent. No evidence for aneurysm or dissection. Renals: Calcified plaque involving the origin of the left renal artery and findings are suggestive for high-grade stenosis and near occlusion of the left renal artery. Right renal artery is widely patent at the origin but has a beaded appearance, best seen on the coronal reformats, sequence 7, image 87. Findings are compatible with fibromuscular dysplasia and there was a similar finding on the previous examination. IMA: Patent Inflow: Bilateral common and external iliac arteries are widely patent without significant stenosis. Bilateral internal iliac arteries are patent with narrowing involving the left internal iliac artery. Proximal Outflow: Proximal femoral arteries are patent bilaterally. Veins: Portal venous system is patent. No gross abnormality to the IVC or iliac veins. Hepatic veins are patent. Review of the MIP images confirms the above findings. NON-VASCULAR Lower chest: Defibrillator leads extending into the right heart. Peripheral 5 mm  density in the right lower lobe on sequence 11, image 17 has minimally changed since 2013 and suggestive for a benign nodule. Motion artifact at the lung bases. No significant pleural effusions. Hepatobiliary: Normal appearance of the liver, gallbladder and portal venous system. No biliary dilatation Pancreas: Round low-density structure at the pancreatic neck region measures up to 8 mm on sequence 4, image  66. This is suggestive for small cystic lesion and unchanged since 2020. No significant pancreatic dilatation. No pancreatic inflammation or stranding. Spleen: Normal in size without focal abnormality. Adrenals/Urinary Tract: Adrenal glands are within normal limits. Left kidney is atrophy and likely associated with the high-grade left renal artery stenosis. Large cortical calcification involving the left kidney lower pole. Scattered calcifications in both kidneys. 7 mm stone in the right kidney lower pole. 8 mm stone in the right kidney interpolar region. Small right renal cysts. No suspicious renal lesions. No hydronephrosis. Urinary bladder is unremarkable. Stomach/Bowel: Mild wall thickening in the distal stomach is nonspecific. No inflammatory changes around the duodenum or stomach. Fluid-filled colon. No focal bowel inflammation and no evidence for bowel obstruction. Appendix contains fluid but no surrounding inflammatory changes. No significant small bowel dilatation. Lymphatic: No abdominopelvic lymphadenopathy. Reproductive: Uterus and bilateral adnexa are unremarkable. Other: Negative for ascites.  Negative for free air. Musculoskeletal: Again noted are multiple cardiac leads in the anterior abdominal subcutaneous tissues. Levoscoliosis of the lumbar spine. Stable old compression deformity involving the T12 vertebral body. Multilevel degenerative changes in the lumbar spine. IMPRESSION: VASCULAR 1. No evidence for chronic mesenteric ischemia. The main mesenteric arteries are patent without significant  stenosis. 2. Stable appearance of the abdominal aorta with focal dilatation in the infrarenal abdominal aorta associated with a focal dissection or penetrating ulcer. 3. Fibromuscular dysplasia involving the right renal artery. 4. Critical stenosis involving the left renal artery with associated left renal atrophy. NON-VASCULAR 1. No acute abnormality abdomen or pelvis. 2. Stable 8 mm low-density structure in the pancreatic head/neck region. This probably represents a small cystic lesion and could represent an IPMN. Recommend follow up pre and post contrast MRI/MRCP or pancreatic protocol CT in 2 years. This recommendation follows ACR consensus guidelines: Management of Incidental Pancreatic Cysts: A White Paper of the ACR Incidental Findings Committee. J Am Coll Radiol 1740;81:448-185. 3. Bilateral renal calculi.  No hydronephrosis. Electronically Signed   By: Markus Daft M.D.   On: 11/18/2019 10:46   US Abdomen Limited RUQ (LIVER/GB)  Result Date: 11/21/2019 CLINICAL DATA:  RIGHT upper quadrant abdominal pain EXAM: ULTRASOUND ABDOMEN LIMITED RIGHT UPPER QUADRANT COMPARISON:  CT abdomen and pelvis 11/18/2019 FINDINGS: Gallbladder: Normally distended without stones or wall thickening. No pericholecystic fluid or sonographic Murphy sign. Common bile duct: Diameter: 4 mm, normal Liver: Coarsened mildly increased hepatic echogenicity likely accentuated by body habitus, likely representing fatty infiltration. No definite hepatic mass or nodularity identified. Portal vein is patent on color Doppler imaging with normal direction of blood flow towards the liver. Other: No RIGHT upper quadrant free fluid. Marked cortical thinning and increased cortical echogenicity of the RIGHT kidney. IMPRESSION: Probable fatty infiltration of liver as above. RIGHT renal cortical atrophy. Electronically Signed   By: Lavonia Dana M.D.   On: 11/21/2019 10:21    Orson Eva, DO  [facility]  If 7PM-7AM, please contact  night-coverage [email or web address] Password [email or web address] 11/21/2019, 6:16 PM   LOS: 6 days

## 2019-11-21 NOTE — Plan of Care (Signed)
Patient sitting up in chair this morning with no complaints. Had no issues with the abdominal ultrasound procedure this morning. After a clear liquid tray post-U/S had some minimal cramping in the RUQ, when asked if it was better than it was previously the patient stated yes. When questioning the patient if she knew what her plan was for today, if she would be going home- patient began to have generalized questions about her health status. Will wait for MD to round to discuss further plan of care for today.

## 2019-11-22 ENCOUNTER — Other Ambulatory Visit (HOSPITAL_COMMUNITY): Payer: Self-pay | Admitting: Physician Assistant

## 2019-11-22 ENCOUNTER — Other Ambulatory Visit: Payer: Self-pay | Admitting: Family Medicine

## 2019-11-22 DIAGNOSIS — E871 Hypo-osmolality and hyponatremia: Secondary | ICD-10-CM | POA: Diagnosis not present

## 2019-11-22 DIAGNOSIS — I480 Paroxysmal atrial fibrillation: Secondary | ICD-10-CM | POA: Diagnosis not present

## 2019-11-22 DIAGNOSIS — I10 Essential (primary) hypertension: Secondary | ICD-10-CM | POA: Diagnosis not present

## 2019-11-22 DIAGNOSIS — R1010 Upper abdominal pain, unspecified: Secondary | ICD-10-CM | POA: Diagnosis not present

## 2019-11-22 LAB — GLUCOSE, CAPILLARY
Glucose-Capillary: 103 mg/dL — ABNORMAL HIGH (ref 70–99)
Glucose-Capillary: 98 mg/dL (ref 70–99)
Glucose-Capillary: 149 mg/dL — ABNORMAL HIGH (ref 70–99)

## 2019-11-22 MED ORDER — AMITRIPTYLINE HCL 10 MG PO TABS
10.0000 mg | ORAL_TABLET | Freq: Every day | ORAL | 1 refills | Status: DC
Start: 2019-11-22 — End: 2020-01-05

## 2019-11-22 MED ORDER — HYDROCORTISONE ACETATE 25 MG RE SUPP
25.0000 mg | Freq: Two times a day (BID) | RECTAL | 0 refills | Status: DC
Start: 2019-11-22 — End: 2019-12-26

## 2019-11-22 MED ORDER — APIXABAN 5 MG PO TABS
5.0000 mg | ORAL_TABLET | Freq: Two times a day (BID) | ORAL | Status: DC
  Administered 2019-11-22: 5 mg via ORAL
  Filled 2019-11-22: qty 1

## 2019-11-22 NOTE — Progress Notes (Signed)
Ambulated to bathroom and had brown liquid bm.  IV's removed and discharge instructions reviewed with patient and daughter.  Scripts sent to pharmacy.  Another family member to give ride home

## 2019-11-22 NOTE — Progress Notes (Signed)
Physician Discharge Summary  [name] YTK:[id number] DOB: 23-Nov-1931 DOA: 11/14/2019  PCP: Renee Norlander, DO  Admit date: 11/14/2019 Discharge date: 11/22/2019  Admitted From: Home Disposition:  Home   Recommendations for Outpatient Follow-up:  1. Follow up with PCP in 1-2 weeks 2. Please obtain BMP/CBC in one week   Home Health: YES Equipment/Devices: HHPT  Discharge Condition: Stable CODE STATUS:DNR Diet recommendation: Heart Healthy   Brief/Interim Summary: 86 y.o.femalewith medical history significant ofatrial fibrillation, history of cardioversion, AICD placement, history of CAD, history of MI, chronic systolic heart failure with most recent EF measurement of 40 to 45%, history of near syncopal episode, orthostatic hypotension episodes, hypertension, anxiety, depression, osteoarthritis, cataracts, chronic back pain, stage III chronic kidney disease, history of DVT, diet controlled type 2 diabetes, diverticulosis, history of esophageal stricture, history of esophagitis, GERD, hiatal hernia, history of rectal bleeding, hyperlipidemia, macular degeneration, nephrolithiasis who is brought to the emergency department via EMS due toweakness and dizziness for the past 2 weeks. The patient thinks that she may have had some chest pain, but does not have any symptomatologyat the time of admission. She denies pain radiation, diaphoresis, palpitations, orthopnea or pitting edema of the lower extremities. The patient also complains that she has been having abdominal pain with decreased appetite, early satiety, bloating sensation and change in bowel habits for the past 2 weeks. She denies nausea, vomiting, diarrhea, melena or hematochezia. No dysuria, frequency or hematuria. She denies trauma sore throat, rhinorrhea, productive cough, dyspnea, wheezing or hemoptysis.   Discharge Diagnoses:  Abdominal pain -CT abdomen indicated questionable appendicitis -Seen by  general surgery this was felt to be unlikely -nofever or leukocytosis -lipase 29 -She did describe having periods of diarrhea, but in the past 2 weeks was on the more constipated side -She was started on MiraLAX and Linzess with multiple bowel movements -She continues to havepost prandialabdominal pain although somewhat better -Since she is having frequent stools, MiraLAX and Linzess have been discontinued -continue Bentyl for possible IBS component -Her CT did describe several areas of arterial calcifications -11/2 CTA abd--main mesenteric arteries are patent -11/4--EGD--mild Schatzki's ring, normal stomach and duodenum -discussed with GI--appears to have musculoskeletal/ functional component -RUQ ultrasound--fatty liver; right renal atrophy, neg GB -11/5--discussed with GI, Dr. Melodie [name] to d/c when tolerates diet--pt tolerating diet without vomiting  Vertigo -She had similar episodes in the past, but symptoms appear to be worse over the past few weeks -She does have declined in systolic blood pressure from 170s to 140s on standing -MRI imaging not an option due to defibrillator -Started on meclizine and as needed benzos -Overall dizziness has improved  Hyponatremia -Suspect related to decreased p.o. intakeand volume depletion -Improved withIVF  Acute on chronic renal failure--CKD 3B -baseline creatinine 1.2-1.3 -presented with serum creatinine 1.55 -improved with IVF  Paroxysmal atrial fibrillation -Anticoagulated with Eliquis--holding temporarily for EGD-->restart -Followed [facility] the [facility] -Started on amiodarone recently  Chronic Systolic CHF -07/21/15 Echo --LVEF40 to45% with no left ventricular hypertrophy. She had global hypokinesis of the left ventricle. -Euvolemic on exam -11/4 Echo--EF 55-60%, G1DD, no WMA  Diabetes, diet controlled -Monitor blood sugars--acceptable  Hypertension -Resume home doses of hydralazine -Blood pressure currently  stable -Use IV hydralazine as needed for high blood pressures  Hypothyroidism. -Resume home dose of Synthroid  Coronary artery disease/Chest Pain -No complaints of chest pain -Continue statin, Imdur -11/3--personally reviewed EKG--sinus, nonspecific T wave changes -check troponins8>>9 -cardiac catheterization 2015 that did not show any significant coronary stenoses. -CP  seems GI related according to pt's description -somewhat reproducible on palpation -discussed withcardiology--no need for further inpatient work up unless CP becomes more typical and recurrent -no chest pain today  Polymorphic VT -no Vtach since admission -continue amiodarone  Advance care planning -Discussed CODE STATUS with patient -She agrees to DNR -Daughter [name] was present for the conversation  Generalized weakness -Seen by physical therapy with recommendationsfor home health physical therapy  Abnormal CTA abd -noted to have focal dilatation in the infrarenal abdominal aorta with a focal dissection or penetrating ulcer -CT reviewed with vascular surgery [name] -Recommendations were that CT findings indicate a very small area which is chronic and unimpressive -considering advanced age and unimpressive findings on imaging, no further work up or intervention was recommended at this time.     Discharge Instructions   Allergies as of 11/22/2019      Reactions   Sotalol Other (See Comments)   Torsades   Bactrim [sulfamethoxazole-trimethoprim]    Itchy rash   Ciprofloxacin Nausea Only   Actos [pioglitazone] Swelling   Adhesive [tape] Other (See Comments)   Causes sores   Contrast Media [iodinated Diagnostic Agents] Other (See Comments)   Headache (no action/pre-med required)   Latex Rash   Lipitor [atorvastatin] Other (See Comments)   myalgia      Medication List    STOP taking these medications   Vitamin D (Ergocalciferol) 1.25 MG (50000 UNIT) Caps capsule Commonly  known as: DRISDOL     TAKE these medications   amiodarone 200 MG tablet Commonly known as: PACERONE Take 200 mg by mouth daily. What changed: Another medication with the same name was removed. Continue taking this medication, and follow the directions you see here.   amitriptyline 10 MG tablet Commonly known as: ELAVIL Take 1 tablet (10 mg total) by mouth at bedtime.   apixaban 5 MG Tabs tablet Commonly known as: ELIQUIS Take 1 tablet (5 mg total) by mouth 2 (two) times daily.   carboxymethylcellulose 0.5 % Soln Commonly known as: REFRESH PLUS Place 1 drop into both eyes 3 (three) times daily as needed (dry/irritated eyes.).   CENTRUM SILVER ULTRA WOMENS PO Take 1 tablet by mouth daily.   diphenoxylate-atropine 2.5-0.025 MG tablet Commonly known as: LOMOTIL TAKE 1 TABLET EVERY 8 HOURS AS NEEDED FOR DIARRHEA What changed: See the new instructions.   hydrALAZINE 10 MG tablet Commonly known as: APRESOLINE TAKE 1 TABLET EVERY 8 HOURS What changed:   how much to take  how to take this  when to take this  additional instructions   hydrocortisone 25 MG suppository Commonly known as: ANUSOL-HC Place 1 suppository (25 mg total) rectally 2 (two) times daily.   isosorbide mononitrate 30 MG 24 hr tablet Commonly known as: IMDUR TAKE ONE HALF (1/2) TABLET DAILY What changed: See the new instructions.   levothyroxine 75 MCG tablet Commonly known as: SYNTHROID Take 1 tablet (75 mcg total) by mouth daily before breakfast.   loratadine 10 MG tablet Commonly known as: CLARITIN Take 1 tablet (10 mg total) by mouth daily. (for itching) What changed:   when to take this  reasons to take this  additional instructions   meclizine 25 MG tablet Commonly known as: ANTIVERT Take 25 mg by mouth 3 (three) times daily as needed for dizziness or nausea.   metoprolol tartrate 25 MG tablet Commonly known as: LOPRESSOR TAKE  (1)  TABLET TWICE A DAY. What changed: See the new  instructions.   nitroGLYCERIN 0.4 MG SL  tablet Commonly known as: NITROSTAT Place 1 tablet (0.4 mg total) under the tongue every 5 (five) minutes as needed for chest pain.   pantoprazole 40 MG tablet Commonly known as: PROTONIX Take 1 tablet (40 mg total) by mouth every evening. What changed: when to take this   PARoxetine 20 MG tablet Commonly known as: PAXIL TAKE (1) TABLET DAILY IN THE MORNING. What changed: See the new instructions.   rosuvastatin 40 MG tablet Commonly known as: CRESTOR TAKE (1/2) TABLET DAILY. What changed: See the new instructions.   torsemide 20 MG tablet Commonly known as: DEMADEX Take 1 tablet (20 mg total) by mouth daily as needed (edema).   Vitamin D 50 MCG (2000 UT) Caps Take 1 capsule by mouth daily.       Follow-up Information    Ronnie Doss M, DO. Go on 11/26/2019.   Specialty: Family Medicine Why: 11:00 appointment.  Contact information: [place] [address] [phone]        [facility] Follow up.   Specialty: Home Health Services Why: Will contact you to schedule home health visits.  Contact information: [address] [place] [address] [phone]              Allergies  Allergen Reactions  . Sotalol Other (See Comments)    Torsades   . Bactrim [Sulfamethoxazole-Trimethoprim]     Itchy rash  . Ciprofloxacin Nausea Only  . Actos [Pioglitazone] Swelling  . Adhesive [Tape] Other (See Comments)    Causes sores  . Contrast Media [Iodinated Diagnostic Agents] Other (See Comments)    Headache (no action/pre-med required)  . Latex Rash  . Lipitor [Atorvastatin] Other (See Comments)    myalgia    Consultations:  GI   Procedures/Studies: DG Chest Portable 1 View  Result Date: 11/14/2019 CLINICAL DATA:  Chest pain EXAM: PORTABLE CHEST 1 VIEW COMPARISON:  Radiograph 07/24/2019 FINDINGS: Cardiac pacer/defibrillator device is again seen with leads in stable position towards  the cardiac apex with additional lead seen projecting over the left upper quadrant. Some chronically coarsened interstitial changes and hyperexpansion are similar to prior. No consolidation, features of edema, pneumothorax, or effusion. Stable cardiomediastinal contours with a calcified aorta. The osseous structures appear diffusely demineralized which may limit detection of small or nondisplaced fractures. No acute osseous or soft tissue abnormality. IMPRESSION: 1. No acute cardiopulmonary abnormality. 2. Stable chronically coarsened interstitial changes and hyperexpansion. Electronically Signed   By: [name] M.D.   On: 11/14/2019 22:33   ECHOCARDIOGRAM COMPLETE  Result Date: 11/20/2019    ECHOCARDIOGRAM REPORT   Patient Name:   [name] Date of Exam: 11/20/2019 Medical Rec #:  [id number]        Height:       63.0 in Accession #:    [id number]       Weight:       139.3 lb Date of Birth:  08-26-1931        BSA:          1.658 m Patient Age:    31 years         BP:           189/78 mmHg Patient Gender: F                HR:           60 bpm. Exam Location:  [facility] Procedure: 2D Echo, Cardiac Doppler and Color Doppler Indications:  Chest Pain 786.50 / R07.9  History:        Patient has prior history of Echocardiogram examinations, most                 recent 01/21/2019. CAD and Previous Myocardial Infarction,                 Arrythmias:Atrial Fibrillation; Risk Factors:Hypertension,                 Diabetes and Dyslipidemia. Chronic ischemic heart disease, Hx of                 COVID-19 virus infection, ICD (implantable                 cardioverter-defibrillator) in place, Long term current use of                 anticoagulant.  Sonographer:    Alvino Chapel RCS Referring Phys: [phone] Rossanna Spitzley IMPRESSIONS  1. Left ventricular ejection fraction, by estimation, is 50 to 55%. The left ventricle has normal function. The left ventricle has no regional wall motion abnormalities. There is mild left  ventricular hypertrophy. Left ventricular diastolic parameters are consistent with Grade I diastolic dysfunction (impaired relaxation).  2. Right ventricular systolic function is normal. The right ventricular size is normal. There is mildly elevated pulmonary artery systolic pressure.  3. Left atrial size was severely dilated.  4. Right atrial size was mildly dilated.  5. The mitral valve is normal in structure. Trivial mitral valve regurgitation. No evidence of mitral stenosis.  6. Tricuspid valve regurgitation is mild to moderate.  7. The aortic valve is tricuspid. Aortic valve regurgitation is mild.  8. The inferior vena cava is normal in size with greater than 50% respiratory variability, suggesting right atrial pressure of 3 mmHg. FINDINGS  Left Ventricle: Left ventricular ejection fraction, by estimation, is 50 to 55%. The left ventricle has normal function. The left ventricle has no regional wall motion abnormalities. The left ventricular internal cavity size was normal in size. There is  mild left ventricular hypertrophy. Left ventricular diastolic parameters are consistent with Grade I diastolic dysfunction (impaired relaxation). Normal left ventricular filling pressure. Right Ventricle: The right ventricular size is normal. No increase in right ventricular wall thickness. Right ventricular systolic function is normal. There is mildly elevated pulmonary artery systolic pressure. The tricuspid regurgitant velocity is 2.96  m/s, and with an assumed right atrial pressure of 3 mmHg, the estimated right ventricular systolic pressure is 99.3 mmHg. Left Atrium: Left atrial size was severely dilated. Right Atrium: Right atrial size was mildly dilated. Pericardium: There is no evidence of pericardial effusion. Mitral Valve: The mitral valve is normal in structure. There is mild thickening of the mitral valve leaflet(s). There is mild calcification of the mitral valve leaflet(s). Mild mitral annular calcification.  Trivial mitral valve regurgitation. No evidence  of mitral valve stenosis. Tricuspid Valve: The tricuspid valve is normal in structure. Tricuspid valve regurgitation is mild to moderate. No evidence of tricuspid stenosis. Aortic Valve: The aortic valve is tricuspid. Aortic valve regurgitation is mild. Aortic regurgitation PHT measures 741 msec. Aortic valve mean gradient measures 2.6 mmHg. Aortic valve peak gradient measures 5.7 mmHg. Aortic valve area, by VTI measures 3.52 cm. Pulmonic Valve: The pulmonic valve was not well visualized. Pulmonic valve regurgitation is mild. No evidence of pulmonic stenosis. Aorta: The aortic root is normal in size and structure. Pulmonary Artery: Mild pulmonary HTN, PASP is 38 mmHg. Venous:  The inferior vena cava is normal in size with greater than 50% respiratory variability, suggesting right atrial pressure of 3 mmHg. IAS/Shunts: No atrial level shunt detected by color flow Doppler.  LEFT VENTRICLE PLAX 2D LVIDd:         4.29 cm     Diastology LVIDs:         3.45 cm     LV e' medial:    4.03 cm/s LV PW:         1.12 cm     LV E/e' medial:  13.0 LV IVS:        1.11 cm     LV e' lateral:   5.22 cm/s LVOT diam:     2.30 cm     LV E/e' lateral: 10.0 LV SV:         84 LV SV Index:   51 LVOT Area:     4.15 cm  LV Volumes (MOD) LV vol d, MOD A2C: 76.5 ml LV vol d, MOD A4C: 82.6 ml LV vol s, MOD A2C: 37.6 ml LV vol s, MOD A4C: 29.2 ml LV SV MOD A2C:     38.9 ml LV SV MOD A4C:     82.6 ml LV SV MOD BP:      47.6 ml RIGHT VENTRICLE RV S prime:     9.57 cm/s TAPSE (M-mode): 2.0 cm LEFT ATRIUM             Index       RIGHT ATRIUM           Index LA diam:        3.60 cm 2.17 cm/m  RA Area:     14.20 cm LA Vol (A2C):   77.2 ml 46.55 ml/m RA Volume:   33.50 ml  20.20 ml/m LA Vol (A4C):   72.4 ml 43.66 ml/m LA Biplane Vol: 74.7 ml 45.05 ml/m  AORTIC VALVE AV Area (Vmax):    3.21 cm AV Area (Vmean):   3.36 cm AV Area (VTI):     3.52 cm AV Vmax:           119.01 cm/s AV Vmean:           73.150 cm/s AV VTI:            0.240 m AV Peak Grad:      5.7 mmHg AV Mean Grad:      2.6 mmHg LVOT Vmax:         91.90 cm/s LVOT Vmean:        59.100 cm/s LVOT VTI:          0.203 m LVOT/AV VTI ratio: 0.85 AI PHT:            741 msec  AORTA Ao Root diam: 3.40 cm MITRAL VALVE               TRICUSPID VALVE MV Area (PHT): 3.72 cm    TR Peak grad:   35.0 mmHg MV Decel Time: 204 msec    TR Vmax:        296.00 cm/s MV E velocity: 52.40 cm/s MV A velocity: 84.00 cm/s  SHUNTS MV E/A ratio:  0.62        Systemic VTI:  0.20 m                            Systemic Diam: 2.30 cm Carlyle Dolly MD Electronically signed by Carlyle Dolly MD  Signature Date/Time: 11/20/2019/4:00:05 PM    Final    CT Renal Stone Study  Result Date: 11/15/2019 CLINICAL DATA:  Lower abdominal pain with dizziness and hematuria. EXAM: CT ABDOMEN AND PELVIS WITHOUT CONTRAST TECHNIQUE: Multidetector CT imaging of the abdomen and pelvis was performed following the standard protocol without IV contrast. COMPARISON:  July 25, 2019 FINDINGS: Lower chest: No acute abnormality. Hepatobiliary: No focal liver abnormality is seen. No gallstones, gallbladder wall thickening, or biliary dilatation. Pancreas: Unremarkable. No pancreatic ductal dilatation or surrounding inflammatory changes. Spleen: Normal in size without focal abnormality. Adrenals/Urinary Tract: Adrenal glands are unremarkable. The left kidney is asymmetrically small when compared to a normal size right kidney. A 1.4 cm x 0.8 cm area of low attenuation is seen within the anterior aspect of the lower pole of the right kidney. Numerous a subcentimeter non-obstructing renal stones of various sizes are again seen throughout both kidneys very mild bilateral hydronephrosis and hydroureter are seen without evidence of obstructing renal calculi. Bladder is unremarkable. Stomach/Bowel: There is a small hiatal hernia. Very mild appendiceal wall thickening is noted. No significant periappendiceal  inflammatory fat stranding is seen. No evidence of bowel wall thickening, distention, or inflammatory changes. Vascular/Lymphatic: There is moderate to marked severity calcification and tortuosity of the abdominal aorta and bilateral common iliac arteries, without evidence of aneurysmal dilatation. No enlarged abdominal or pelvic lymph nodes. Reproductive: Uterus and bilateral adnexa are unremarkable. Other: No abdominal wall hernia or abnormality. No abdominopelvic ascites. Musculoskeletal: There is moderate to marked severity levoscoliosis of the lumbar spine with marked severity degenerative changes seen throughout the lumbar spine. IMPRESSION: 1. Mild appendiceal wall thickening, without periappendiceal inflammatory fat stranding. Early appendicitis cannot completely be excluded. 2. Numerous bilateral subcentimeter non-obstructing renal stones. 3. Small hiatal hernia. 4. Moderate to marked severity levoscoliosis of the lumbar spine with marked severity degenerative changes throughout the lumbar spine. 5. Aortic atherosclerosis. Aortic Atherosclerosis (ICD10-I70.0). Electronically Signed   By: Virgina Norfolk M.D.   On: 11/15/2019 01:50   CT Angio Abd/Pel w/ and/or w/o  Result Date: 11/18/2019 CLINICAL DATA:  Abdominal pain with decreased appetite. Evaluate for chronic mesenteric ischemia. EXAM: CTA ABDOMEN AND PELVIS  WITH CONTRAST TECHNIQUE: Multidetector CT imaging of the abdomen and pelvis was performed using the standard protocol during bolus administration of intravenous contrast. Multiplanar reconstructed images and MIPs were obtained and reviewed to evaluate the vascular anatomy. CONTRAST:  45mL OMNIPAQUE IOHEXOL 350 MG/ML SOLN COMPARISON:  CT renal stone protocol 11/15/2019 and 01/11/2019 FINDINGS: VASCULAR Aorta: Diffuse atherosclerotic disease involving the abdominal aorta. Abdominal aorta is tortuous and related to the levoscoliosis. Again noted is a focal dilatation in the infrarenal aorta just  beyond the takeoff of the IMA. This focal dilatation measures up to 2.4 cm and not significantly changed since 03/13/2018. In addition, there is an intimal flap along the left side of the abdominal aorta at this area of dilatation. This is unchanged from the exam in 2020 and could represent a focal dissection versus penetrating ulcer. No significant aortic stenosis. Celiac: Patent without evidence of aneurysm, dissection, vasculitis or significant stenosis. SMA: Mild narrowing at the origin of the SMA. The stenosis does not appear to be hemodynamically significant. Main SMA branches are widely patent. No evidence for aneurysm or dissection. Renals: Calcified plaque involving the origin of the left renal artery and findings are suggestive for high-grade stenosis and near occlusion of the left renal artery. Right renal artery is widely patent at the origin but has a beaded  appearance, best seen on the coronal reformats, sequence 7, image 87. Findings are compatible with fibromuscular dysplasia and there was a similar finding on the previous examination. IMA: Patent Inflow: Bilateral common and external iliac arteries are widely patent without significant stenosis. Bilateral internal iliac arteries are patent with narrowing involving the left internal iliac artery. Proximal Outflow: Proximal femoral arteries are patent bilaterally. Veins: Portal venous system is patent. No gross abnormality to the IVC or iliac veins. Hepatic veins are patent. Review of the MIP images confirms the above findings. NON-VASCULAR Lower chest: Defibrillator leads extending into the right heart. Peripheral 5 mm density in the right lower lobe on sequence 11, image 17 has minimally changed since 2013 and suggestive for a benign nodule. Motion artifact at the lung bases. No significant pleural effusions. Hepatobiliary: Normal appearance of the liver, gallbladder and portal venous system. No biliary dilatation Pancreas: Round low-density  structure at the pancreatic neck region measures up to 8 mm on sequence 4, image 66. This is suggestive for small cystic lesion and unchanged since 2020. No significant pancreatic dilatation. No pancreatic inflammation or stranding. Spleen: Normal in size without focal abnormality. Adrenals/Urinary Tract: Adrenal glands are within normal limits. Left kidney is atrophy and likely associated with the high-grade left renal artery stenosis. Large cortical calcification involving the left kidney lower pole. Scattered calcifications in both kidneys. 7 mm stone in the right kidney lower pole. 8 mm stone in the right kidney interpolar region. Small right renal cysts. No suspicious renal lesions. No hydronephrosis. Urinary bladder is unremarkable. Stomach/Bowel: Mild wall thickening in the distal stomach is nonspecific. No inflammatory changes around the duodenum or stomach. Fluid-filled colon. No focal bowel inflammation and no evidence for bowel obstruction. Appendix contains fluid but no surrounding inflammatory changes. No significant small bowel dilatation. Lymphatic: No abdominopelvic lymphadenopathy. Reproductive: Uterus and bilateral adnexa are unremarkable. Other: Negative for ascites.  Negative for free air. Musculoskeletal: Again noted are multiple cardiac leads in the anterior abdominal subcutaneous tissues. Levoscoliosis of the lumbar spine. Stable old compression deformity involving the T12 vertebral body. Multilevel degenerative changes in the lumbar spine. IMPRESSION: VASCULAR 1. No evidence for chronic mesenteric ischemia. The main mesenteric arteries are patent without significant stenosis. 2. Stable appearance of the abdominal aorta with focal dilatation in the infrarenal abdominal aorta associated with a focal dissection or penetrating ulcer. 3. Fibromuscular dysplasia involving the right renal artery. 4. Critical stenosis involving the left renal artery with associated left renal atrophy. NON-VASCULAR 1.  No acute abnormality abdomen or pelvis. 2. Stable 8 mm low-density structure in the pancreatic head/neck region. This probably represents a small cystic lesion and could represent an IPMN. Recommend follow up pre and post contrast MRI/MRCP or pancreatic protocol CT in 2 years. This recommendation follows ACR consensus guidelines: Management of Incidental Pancreatic Cysts: A White Paper of the ACR Incidental Findings Committee. J Am Coll Radiol 1660;63:[id number]. 3. Bilateral renal calculi.  No hydronephrosis. Electronically Signed   By: Markus Daft M.D.   On: 11/18/2019 10:46   US Abdomen Limited RUQ (LIVER/GB)  Result Date: 11/21/2019 CLINICAL DATA:  RIGHT upper quadrant abdominal pain EXAM: ULTRASOUND ABDOMEN LIMITED RIGHT UPPER QUADRANT COMPARISON:  CT abdomen and pelvis 11/18/2019 FINDINGS: Gallbladder: Normally distended without stones or wall thickening. No pericholecystic fluid or sonographic Murphy sign. Common bile duct: Diameter: 4 mm, normal Liver: Coarsened mildly increased hepatic echogenicity likely accentuated by body habitus, likely representing fatty infiltration. No definite hepatic mass or nodularity identified. Portal vein is patent  on color Doppler imaging with normal direction of blood flow towards the liver. Other: No RIGHT upper quadrant free fluid. Marked cortical thinning and increased cortical echogenicity of the RIGHT kidney. IMPRESSION: Probable fatty infiltration of liver as above. RIGHT renal cortical atrophy. Electronically Signed   By: Lavonia Dana M.D.   On: 11/21/2019 10:21         Discharge Exam: Vitals:   11/21/19 2103 11/22/19 0515  BP: (!) 167/81 (!) 142/70  Pulse: 67 66  Resp: 16 15  Temp: 98.5 F (36.9 C) 97.9 F (36.6 C)  SpO2: 97% 96%   Vitals:   11/21/19 1438 11/21/19 2007 11/21/19 2103 11/22/19 0515  BP: (!) 151/68  (!) 167/81 (!) 142/70  Pulse: 65  67 66  Resp: 17  16 15   Temp: 97.6 F (36.4 C)  98.5 F (36.9 C) 97.9 F (36.6 C)  TempSrc: Oral   Oral Oral  SpO2: 98% 97% 97% 96%  Weight:      Height:        General: Pt is alert, awake, not in acute distress Cardiovascular: RRR, S1/S2 +, no rubs, no gallops Respiratory: CTA bilaterally, no wheezing, no rhonchi Abdominal: Soft, NT, ND, bowel sounds + Extremities: no edema, no cyanosis   The results of significant diagnostics from this hospitalization (including imaging, microbiology, ancillary and laboratory) are listed below for reference.    Significant Diagnostic Studies: DG Chest Portable 1 View  Result Date: 11/14/2019 CLINICAL DATA:  Chest pain EXAM: PORTABLE CHEST 1 VIEW COMPARISON:  Radiograph 07/24/2019 FINDINGS: Cardiac pacer/defibrillator device is again seen with leads in stable position towards the cardiac apex with additional lead seen projecting over the left upper quadrant. Some chronically coarsened interstitial changes and hyperexpansion are similar to prior. No consolidation, features of edema, pneumothorax, or effusion. Stable cardiomediastinal contours with a calcified aorta. The osseous structures appear diffusely demineralized which may limit detection of small or nondisplaced fractures. No acute osseous or soft tissue abnormality. IMPRESSION: 1. No acute cardiopulmonary abnormality. 2. Stable chronically coarsened interstitial changes and hyperexpansion. Electronically Signed   By: [name] M.D.   On: 11/14/2019 22:33   ECHOCARDIOGRAM COMPLETE  Result Date: 11/20/2019    ECHOCARDIOGRAM REPORT   Patient Name:   ASTRYD PEARCY Date of Exam: 11/20/2019 Medical Rec #:  [id number]        Height:       63.0 in Accession #:    [id number]       Weight:       139.3 lb Date of Birth:  1931-03-05        BSA:          1.658 m Patient Age:    47 years         BP:           189/78 mmHg Patient Gender: F                HR:           60 bpm. Exam Location:  [facility] Procedure: 2D Echo, Cardiac Doppler and Color Doppler Indications:    Chest Pain 786.50 / R07.9  History:         Patient has prior history of Echocardiogram examinations, most                 recent 01/21/2019. CAD and Previous Myocardial Infarction,                 Arrythmias:Atrial Fibrillation; Risk  Factors:Hypertension,                 Diabetes and Dyslipidemia. Chronic ischemic heart disease, Hx of                 COVID-19 virus infection, ICD (implantable                 cardioverter-defibrillator) in place, Long term current use of                 anticoagulant.  Sonographer:    Alvino Chapel RCS Referring Phys: [phone] Jerrine Urschel IMPRESSIONS  1. Left ventricular ejection fraction, by estimation, is 50 to 55%. The left ventricle has normal function. The left ventricle has no regional wall motion abnormalities. There is mild left ventricular hypertrophy. Left ventricular diastolic parameters are consistent with Grade I diastolic dysfunction (impaired relaxation).  2. Right ventricular systolic function is normal. The right ventricular size is normal. There is mildly elevated pulmonary artery systolic pressure.  3. Left atrial size was severely dilated.  4. Right atrial size was mildly dilated.  5. The mitral valve is normal in structure. Trivial mitral valve regurgitation. No evidence of mitral stenosis.  6. Tricuspid valve regurgitation is mild to moderate.  7. The aortic valve is tricuspid. Aortic valve regurgitation is mild.  8. The inferior vena cava is normal in size with greater than 50% respiratory variability, suggesting right atrial pressure of 3 mmHg. FINDINGS  Left Ventricle: Left ventricular ejection fraction, by estimation, is 50 to 55%. The left ventricle has normal function. The left ventricle has no regional wall motion abnormalities. The left ventricular internal cavity size was normal in size. There is  mild left ventricular hypertrophy. Left ventricular diastolic parameters are consistent with Grade I diastolic dysfunction (impaired relaxation). Normal left ventricular filling pressure. Right Ventricle:  The right ventricular size is normal. No increase in right ventricular wall thickness. Right ventricular systolic function is normal. There is mildly elevated pulmonary artery systolic pressure. The tricuspid regurgitant velocity is 2.96  m/s, and with an assumed right atrial pressure of 3 mmHg, the estimated right ventricular systolic pressure is 85.4 mmHg. Left Atrium: Left atrial size was severely dilated. Right Atrium: Right atrial size was mildly dilated. Pericardium: There is no evidence of pericardial effusion. Mitral Valve: The mitral valve is normal in structure. There is mild thickening of the mitral valve leaflet(s). There is mild calcification of the mitral valve leaflet(s). Mild mitral annular calcification. Trivial mitral valve regurgitation. No evidence  of mitral valve stenosis. Tricuspid Valve: The tricuspid valve is normal in structure. Tricuspid valve regurgitation is mild to moderate. No evidence of tricuspid stenosis. Aortic Valve: The aortic valve is tricuspid. Aortic valve regurgitation is mild. Aortic regurgitation PHT measures 741 msec. Aortic valve mean gradient measures 2.6 mmHg. Aortic valve peak gradient measures 5.7 mmHg. Aortic valve area, by VTI measures 3.52 cm. Pulmonic Valve: The pulmonic valve was not well visualized. Pulmonic valve regurgitation is mild. No evidence of pulmonic stenosis. Aorta: The aortic root is normal in size and structure. Pulmonary Artery: Mild pulmonary HTN, PASP is 38 mmHg. Venous: The inferior vena cava is normal in size with greater than 50% respiratory variability, suggesting right atrial pressure of 3 mmHg. IAS/Shunts: No atrial level shunt detected by color flow Doppler.  LEFT VENTRICLE PLAX 2D LVIDd:         4.29 cm     Diastology LVIDs:         3.45 cm  LV e' medial:    4.03 cm/s LV PW:         1.12 cm     LV E/e' medial:  13.0 LV IVS:        1.11 cm     LV e' lateral:   5.22 cm/s LVOT diam:     2.30 cm     LV E/e' lateral: 10.0 LV SV:         84  LV SV Index:   51 LVOT Area:     4.15 cm  LV Volumes (MOD) LV vol d, MOD A2C: 76.5 ml LV vol d, MOD A4C: 82.6 ml LV vol s, MOD A2C: 37.6 ml LV vol s, MOD A4C: 29.2 ml LV SV MOD A2C:     38.9 ml LV SV MOD A4C:     82.6 ml LV SV MOD BP:      47.6 ml RIGHT VENTRICLE RV S prime:     9.57 cm/s TAPSE (M-mode): 2.0 cm LEFT ATRIUM             Index       RIGHT ATRIUM           Index LA diam:        3.60 cm 2.17 cm/m  RA Area:     14.20 cm LA Vol (A2C):   77.2 ml 46.55 ml/m RA Volume:   33.50 ml  20.20 ml/m LA Vol (A4C):   72.4 ml 43.66 ml/m LA Biplane Vol: 74.7 ml 45.05 ml/m  AORTIC VALVE AV Area (Vmax):    3.21 cm AV Area (Vmean):   3.36 cm AV Area (VTI):     3.52 cm AV Vmax:           119.01 cm/s AV Vmean:          73.150 cm/s AV VTI:            0.240 m AV Peak Grad:      5.7 mmHg AV Mean Grad:      2.6 mmHg LVOT Vmax:         91.90 cm/s LVOT Vmean:        59.100 cm/s LVOT VTI:          0.203 m LVOT/AV VTI ratio: 0.85 AI PHT:            741 msec  AORTA Ao Root diam: 3.40 cm MITRAL VALVE               TRICUSPID VALVE MV Area (PHT): 3.72 cm    TR Peak grad:   35.0 mmHg MV Decel Time: 204 msec    TR Vmax:        296.00 cm/s MV E velocity: 52.40 cm/s MV A velocity: 84.00 cm/s  SHUNTS MV E/A ratio:  0.62        Systemic VTI:  0.20 m                            Systemic Diam: 2.30 cm Carlyle Dolly MD Electronically signed by Carlyle Dolly MD Signature Date/Time: 11/20/2019/4:00:05 PM    Final    CT Renal Stone Study  Result Date: 11/15/2019 CLINICAL DATA:  Lower abdominal pain with dizziness and hematuria. EXAM: CT ABDOMEN AND PELVIS WITHOUT CONTRAST TECHNIQUE: Multidetector CT imaging of the abdomen and pelvis was performed following the standard protocol without IV contrast. COMPARISON:  July 25, 2019 FINDINGS: Lower chest: No acute abnormality. Hepatobiliary: No  focal liver abnormality is seen. No gallstones, gallbladder wall thickening, or biliary dilatation. Pancreas: Unremarkable. No pancreatic ductal  dilatation or surrounding inflammatory changes. Spleen: Normal in size without focal abnormality. Adrenals/Urinary Tract: Adrenal glands are unremarkable. The left kidney is asymmetrically small when compared to a normal size right kidney. A 1.4 cm x 0.8 cm area of low attenuation is seen within the anterior aspect of the lower pole of the right kidney. Numerous a subcentimeter non-obstructing renal stones of various sizes are again seen throughout both kidneys very mild bilateral hydronephrosis and hydroureter are seen without evidence of obstructing renal calculi. Bladder is unremarkable. Stomach/Bowel: There is a small hiatal hernia. Very mild appendiceal wall thickening is noted. No significant periappendiceal inflammatory fat stranding is seen. No evidence of bowel wall thickening, distention, or inflammatory changes. Vascular/Lymphatic: There is moderate to marked severity calcification and tortuosity of the abdominal aorta and bilateral common iliac arteries, without evidence of aneurysmal dilatation. No enlarged abdominal or pelvic lymph nodes. Reproductive: Uterus and bilateral adnexa are unremarkable. Other: No abdominal wall hernia or abnormality. No abdominopelvic ascites. Musculoskeletal: There is moderate to marked severity levoscoliosis of the lumbar spine with marked severity degenerative changes seen throughout the lumbar spine. IMPRESSION: 1. Mild appendiceal wall thickening, without periappendiceal inflammatory fat stranding. Early appendicitis cannot completely be excluded. 2. Numerous bilateral subcentimeter non-obstructing renal stones. 3. Small hiatal hernia. 4. Moderate to marked severity levoscoliosis of the lumbar spine with marked severity degenerative changes throughout the lumbar spine. 5. Aortic atherosclerosis. Aortic Atherosclerosis (ICD10-I70.0). Electronically Signed   By: Virgina Norfolk M.D.   On: 11/15/2019 01:50   CT Angio Abd/Pel w/ and/or w/o  Result Date:  11/18/2019 CLINICAL DATA:  Abdominal pain with decreased appetite. Evaluate for chronic mesenteric ischemia. EXAM: CTA ABDOMEN AND PELVIS  WITH CONTRAST TECHNIQUE: Multidetector CT imaging of the abdomen and pelvis was performed using the standard protocol during bolus administration of intravenous contrast. Multiplanar reconstructed images and MIPs were obtained and reviewed to evaluate the vascular anatomy. CONTRAST:  76mL OMNIPAQUE IOHEXOL 350 MG/ML SOLN COMPARISON:  CT renal stone protocol 11/15/2019 and 01/11/2019 FINDINGS: VASCULAR Aorta: Diffuse atherosclerotic disease involving the abdominal aorta. Abdominal aorta is tortuous and related to the levoscoliosis. Again noted is a focal dilatation in the infrarenal aorta just beyond the takeoff of the IMA. This focal dilatation measures up to 2.4 cm and not significantly changed since 03/13/2018. In addition, there is an intimal flap along the left side of the abdominal aorta at this area of dilatation. This is unchanged from the exam in 2020 and could represent a focal dissection versus penetrating ulcer. No significant aortic stenosis. Celiac: Patent without evidence of aneurysm, dissection, vasculitis or significant stenosis. SMA: Mild narrowing at the origin of the SMA. The stenosis does not appear to be hemodynamically significant. Main SMA branches are widely patent. No evidence for aneurysm or dissection. Renals: Calcified plaque involving the origin of the left renal artery and findings are suggestive for high-grade stenosis and near occlusion of the left renal artery. Right renal artery is widely patent at the origin but has a beaded appearance, best seen on the coronal reformats, sequence 7, image 87. Findings are compatible with fibromuscular dysplasia and there was a similar finding on the previous examination. IMA: Patent Inflow: Bilateral common and external iliac arteries are widely patent without significant stenosis. Bilateral internal iliac  arteries are patent with narrowing involving the left internal iliac artery. Proximal Outflow: Proximal femoral arteries are patent bilaterally. Veins: Portal  venous system is patent. No gross abnormality to the IVC or iliac veins. Hepatic veins are patent. Review of the MIP images confirms the above findings. NON-VASCULAR Lower chest: Defibrillator leads extending into the right heart. Peripheral 5 mm density in the right lower lobe on sequence 11, image 17 has minimally changed since 2013 and suggestive for a benign nodule. Motion artifact at the lung bases. No significant pleural effusions. Hepatobiliary: Normal appearance of the liver, gallbladder and portal venous system. No biliary dilatation Pancreas: Round low-density structure at the pancreatic neck region measures up to 8 mm on sequence 4, image 66. This is suggestive for small cystic lesion and unchanged since 2020. No significant pancreatic dilatation. No pancreatic inflammation or stranding. Spleen: Normal in size without focal abnormality. Adrenals/Urinary Tract: Adrenal glands are within normal limits. Left kidney is atrophy and likely associated with the high-grade left renal artery stenosis. Large cortical calcification involving the left kidney lower pole. Scattered calcifications in both kidneys. 7 mm stone in the right kidney lower pole. 8 mm stone in the right kidney interpolar region. Small right renal cysts. No suspicious renal lesions. No hydronephrosis. Urinary bladder is unremarkable. Stomach/Bowel: Mild wall thickening in the distal stomach is nonspecific. No inflammatory changes around the duodenum or stomach. Fluid-filled colon. No focal bowel inflammation and no evidence for bowel obstruction. Appendix contains fluid but no surrounding inflammatory changes. No significant small bowel dilatation. Lymphatic: No abdominopelvic lymphadenopathy. Reproductive: Uterus and bilateral adnexa are unremarkable. Other: Negative for ascites.   Negative for free air. Musculoskeletal: Again noted are multiple cardiac leads in the anterior abdominal subcutaneous tissues. Levoscoliosis of the lumbar spine. Stable old compression deformity involving the T12 vertebral body. Multilevel degenerative changes in the lumbar spine. IMPRESSION: VASCULAR 1. No evidence for chronic mesenteric ischemia. The main mesenteric arteries are patent without significant stenosis. 2. Stable appearance of the abdominal aorta with focal dilatation in the infrarenal abdominal aorta associated with a focal dissection or penetrating ulcer. 3. Fibromuscular dysplasia involving the right renal artery. 4. Critical stenosis involving the left renal artery with associated left renal atrophy. NON-VASCULAR 1. No acute abnormality abdomen or pelvis. 2. Stable 8 mm low-density structure in the pancreatic head/neck region. This probably represents a small cystic lesion and could represent an IPMN. Recommend follow up pre and post contrast MRI/MRCP or pancreatic protocol CT in 2 years. This recommendation follows ACR consensus guidelines: Management of Incidental Pancreatic Cysts: A White Paper of the ACR Incidental Findings Committee. J Am Coll Radiol 1308;65:784-696. 3. Bilateral renal calculi.  No hydronephrosis. Electronically Signed   By: Markus Daft M.D.   On: 11/18/2019 10:46   US Abdomen Limited RUQ (LIVER/GB)  Result Date: 11/21/2019 CLINICAL DATA:  RIGHT upper quadrant abdominal pain EXAM: ULTRASOUND ABDOMEN LIMITED RIGHT UPPER QUADRANT COMPARISON:  CT abdomen and pelvis 11/18/2019 FINDINGS: Gallbladder: Normally distended without stones or wall thickening. No pericholecystic fluid or sonographic Murphy sign. Common bile duct: Diameter: 4 mm, normal Liver: Coarsened mildly increased hepatic echogenicity likely accentuated by body habitus, likely representing fatty infiltration. No definite hepatic mass or nodularity identified. Portal vein is patent on color Doppler imaging with  normal direction of blood flow towards the liver. Other: No RIGHT upper quadrant free fluid. Marked cortical thinning and increased cortical echogenicity of the RIGHT kidney. IMPRESSION: Probable fatty infiltration of liver as above. RIGHT renal cortical atrophy. Electronically Signed   By: Lavonia Dana M.D.   On: 11/21/2019 10:21     Microbiology: Recent Results (from the  past 240 hour(s))  Respiratory Panel by RT PCR (Flu A&B, Covid) - Nasopharyngeal Swab     Status: None   Collection Time: 11/15/19  3:13 AM   Specimen: Nasopharyngeal Swab  Result Value Ref Range Status   SARS Coronavirus 2 by RT PCR NEGATIVE NEGATIVE Final    Comment: (NOTE) SARS-CoV-2 target nucleic acids are NOT DETECTED.  The SARS-CoV-2 RNA is generally detectable in upper respiratoy specimens during the acute phase of infection. The lowest concentration of SARS-CoV-2 viral copies this assay can detect is 131 copies/mL. A negative result does not preclude SARS-Cov-2 infection and should not be used as the sole basis for treatment or other patient management decisions. A negative result may occur with  improper specimen collection/handling, submission of specimen other than nasopharyngeal swab, presence of viral mutation(s) within the areas targeted by this assay, and inadequate number of viral copies (<131 copies/mL). A negative result must be combined with clinical observations, patient history, and epidemiological information. The expected result is Negative.  Fact Sheet for Patients:  PinkCheek.be  Fact Sheet for Healthcare Providers:  GravelBags.it  This test is no t yet approved or cleared by the [place] FDA and  has been authorized for detection and/or diagnosis of SARS-CoV-2 by FDA under an Emergency Use Authorization (EUA). This EUA will remain  in effect (meaning this test can be used) for the duration of the COVID-19 declaration under  Section 564(b)(1) of the Act, 21 U.S.C. section 360bbb-3(b)(1), unless the authorization is terminated or revoked sooner.     Influenza A by PCR NEGATIVE NEGATIVE Final   Influenza B by PCR NEGATIVE NEGATIVE Final    Comment: (NOTE) The Xpert Xpress SARS-CoV-2/FLU/RSV assay is intended as an aid in  the diagnosis of influenza from Nasopharyngeal swab specimens and  should not be used as a sole basis for treatment. Nasal washings and  aspirates are unacceptable for Xpert Xpress SARS-CoV-2/FLU/RSV  testing.  Fact Sheet for Patients: PinkCheek.be  Fact Sheet for Healthcare Providers: GravelBags.it  This test is not yet approved or cleared by the [place] FDA and  has been authorized for detection and/or diagnosis of SARS-CoV-2 by  FDA under an Emergency Use Authorization (EUA). This EUA will remain  in effect (meaning this test can be used) for the duration of the  Covid-19 declaration under Section 564(b)(1) of the Act, 21  U.S.C. section 360bbb-3(b)(1), unless the authorization is  terminated or revoked. Performed at [facility], [address], [place],  [address]      Labs: Basic Metabolic Panel: Recent Labs  Lab 11/16/19 [phone] 11/16/19 0605 11/17/19 0421 11/17/19 0421 11/18/19 0550 11/18/19 0550 11/19/19 0438 11/20/19 0435  NA 135  --  138  --  137  --  135 136  K 4.3   < > 3.7   < > 3.0*   < > 4.7 3.5  CL 102  --  109  --  107  --  107 110  CO2 25  --  21*  --  22  --  21* 19*  GLUCOSE 107*  --  90  --  110*  --  97 94  BUN 19  --  13  --  13  --  18 21  CREATININE 1.47*  --  1.20*  --  1.11*  --  1.28* 1.29*  CALCIUM 8.6*  --  8.6*  --  8.6*  --  9.0 8.7*  MG  --   --   --   --   --   --  1.8 1.8   < > = values in this interval not displayed.   Liver Function Tests: Recent Labs  Lab 11/17/19 0421 11/20/19 0435  AST 33 40  ALT 27 31  ALKPHOS 56 55  BILITOT 0.9 0.7  PROT 5.9* 5.8*   ALBUMIN 3.2* 3.1*   Recent Labs  Lab 11/20/19 0435  LIPASE 29   No results for input(s): AMMONIA in the last 168 hours. CBC: Recent Labs  Lab 11/17/19 0421 11/18/19 0550  WBC 4.8 5.6  HGB 11.3* 11.3*  HCT 34.6* 35.2*  MCV 98.3 97.8  PLT 203 233   Cardiac Enzymes: No results for input(s): CKTOTAL, CKMB, CKMBINDEX, TROPONINI in the last 168 hours. BNP: Invalid input(s): POCBNP CBG: Recent Labs  Lab 11/21/19 0750 11/21/19 1130 11/21/19 1625 11/22/19 0511 11/22/19 0734  GLUCAP 89 116* 119* 103* 98    Time coordinating discharge:  36 minutes  Signed:  Orson Eva, DO [facility] Pager: [phone] 11/22/2019, 10:07 AM

## 2019-11-24 NOTE — Telephone Encounter (Signed)
OV made and appt card mailed °

## 2019-11-24 NOTE — Discharge Summary (Signed)
Physician Discharge Summary  LETANYA FROH YIR:[id number] DOB: April 04, 1931 DOA: 11/14/2019  PCP: [name], DO  Admit date: 11/14/2019 Discharge date: 11/22/2019  Admitted From: Home Disposition:  Home   Recommendations for Outpatient Follow-up:  1. Follow up with PCP in 1-2 weeks 2. Please obtain BMP/CBC in one week   Home Health: YES Equipment/Devices: HHPT  Discharge Condition: Stable CODE STATUS: DNR Diet recommendation: Heart Healthy    Brief/Interim Summary: 86 y.o.femalewith medical history significant ofatrial fibrillation, history of cardioversion, AICD placement, history of CAD, history of MI, chronic systolic heart failure with most recent EF measurement of 40 to 45%, history of near syncopal episode, orthostatic hypotension episodes, hypertension, anxiety, depression, osteoarthritis, cataracts, chronic back pain, stage III chronic kidney disease, history of DVT, diet controlled type 2 diabetes, diverticulosis, history of esophageal stricture, history of esophagitis, GERD, hiatal hernia, history of rectal bleeding, hyperlipidemia, macular degeneration, nephrolithiasis who is brought to the emergency department via EMS due toweakness and dizziness for the past 2 weeks. The patient thinks that she may have had some chest pain, but does not have any symptomatologyat the time of admission. She denies pain radiation, diaphoresis, palpitations, orthopnea or pitting edema of the lower extremities. The patient also complains that she has been having abdominal pain with decreased appetite, early satiety, bloating sensation and change in bowel habits for the past 2 weeks. She denies nausea, vomiting, diarrhea, melena or hematochezia. No dysuria, frequency or hematuria. She denies trauma sore throat, rhinorrhea, productive cough, dyspnea, wheezing or hemoptysis.    Discharge Diagnoses:  Abdominal pain -CT abdomen indicated questionable appendicitis -Seen by  general surgery this was felt to be unlikely -nofever or leukocytosis -lipase 29 -She did describe having periods of diarrhea, but in the past 2 weeks was on the more constipated side -She was started on MiraLAX and Linzess with multiple bowel movements -She continues to havepost prandialabdominal pain although somewhat better -Since she is having frequent stools, MiraLAX and Linzess have been discontinued -continueBentyl for possible IBS component -Her CT did describe several areas of arterial calcifications -11/2 CTA abd--main mesenteric arteries are patent -11/4--EGD--mild Schatzki's ring, normal stomach and duodenum -discussed with GI--appears to have musculoskeletal/ functionalcomponent -RUQ ultrasound--fatty liver; right renal atrophy, neg GB -11/5--discussed with GI, Dr. Melodie Bouillon to d/c when tolerates diet--pt tolerating diet without vomiting  Vertigo -She had similar episodes in the past, but symptoms appear to be worse over the past few weeks -She does have declined in systolic blood pressure from 170s to 140s on standing -MRI imaging not an option due to defibrillator -Started on meclizine and as needed benzos -Overall dizziness has improved  Hyponatremia -Suspect related to decreased p.o. intakeand volume depletion -Improved withIVF  Acute on chronic renal failure--CKD 3B -baseline creatinine 1.2-1.3 -presented with serum creatinine 1.55 -improved with IVF  Paroxysmal atrial fibrillation -Anticoagulated with Eliquis--holding temporarily for EGD-->restart -Followed [facility] -Started on amiodarone recently  Chronic Systolic CHF -5/0/09 Echo --LVEF40 to45% with no left ventricular hypertrophy. She had global hypokinesis of the left ventricle. -Euvolemic on exam -11/4 Echo--EF 55-60%, G1DD, no WMA  Diabetes, diet controlled -Monitor blood sugars--acceptable  Hypertension -Resume home doses of hydralazine -Blood pressure currently  stable -Use IV hydralazine as needed for high blood pressures  Hypothyroidism. -Resume home dose of Synthroid  Coronary artery disease/Chest Pain -No complaints of chest pain -Continue statin, Imdur -11/3--personally reviewed EKG--sinus, nonspecific T wave changes -check troponins8>>9 -cardiac catheterization 2015 that did not show any significant coronary stenoses. -  CP seems GI related according to pt's description -somewhat reproducible on palpation -discussed withcardiology--no need for further inpatient work up unless CP becomes more typical and recurrent -no chestpaintoday  Polymorphic VT -no Vtach since admission -continue amiodarone  Advance care planning -Discussed CODE STATUS with patient -She agrees to DNR -[name] was present for the conversation  Generalized weakness -Seen by physical therapy with recommendationsfor home health physical therapy  Abnormal CTA abd -noted to have focal dilatation in the infrarenal abdominal aorta with a focal dissection or penetrating ulcer -CT reviewed with vascular surgery Dr. Scot Dock -Recommendations were that CT findings indicate a very small area which is chronic and unimpressive -considering advanced age and unimpressive findings on imaging, no further work up or intervention was recommended at this time.    Discharge Instructions   Allergies as of 11/22/2019      Reactions   Sotalol Other (See Comments)   Torsades   Bactrim [sulfamethoxazole-trimethoprim]    Itchy rash   Ciprofloxacin Nausea Only   Actos [pioglitazone] Swelling   Adhesive [tape] Other (See Comments)   Causes sores   Contrast Media [iodinated Diagnostic Agents] Other (See Comments)   Headache (no action/pre-med required)   Latex Rash   Lipitor [atorvastatin] Other (See Comments)   myalgia      Medication List    STOP taking these medications   amiodarone 100 MG tablet Commonly known as: PACERONE   Vitamin D  (Ergocalciferol) 1.25 MG (50000 UNIT) Caps capsule Commonly known as: DRISDOL     TAKE these medications   amitriptyline 10 MG tablet Commonly known as: ELAVIL Take 1 tablet (10 mg total) by mouth at bedtime.   apixaban 5 MG Tabs tablet Commonly known as: ELIQUIS Take 1 tablet (5 mg total) by mouth 2 (two) times daily.   carboxymethylcellulose 0.5 % Soln Commonly known as: REFRESH PLUS Place 1 drop into both eyes 3 (three) times daily as needed (dry/irritated eyes.).   CENTRUM SILVER ULTRA WOMENS PO Take 1 tablet by mouth daily.   diphenoxylate-atropine 2.5-0.025 MG tablet Commonly known as: LOMOTIL TAKE 1 TABLET EVERY 8 HOURS AS NEEDED FOR DIARRHEA What changed: See the new instructions.   hydrALAZINE 10 MG tablet Commonly known as: APRESOLINE TAKE 1 TABLET EVERY 8 HOURS What changed:   how much to take  how to take this  when to take this  additional instructions   hydrocortisone 25 MG suppository Commonly known as: ANUSOL-HC Place 1 suppository (25 mg total) rectally 2 (two) times daily.   isosorbide mononitrate 30 MG 24 hr tablet Commonly known as: IMDUR TAKE ONE HALF (1/2) TABLET DAILY What changed: See the new instructions.   levothyroxine 75 MCG tablet Commonly known as: SYNTHROID Take 1 tablet (75 mcg total) by mouth daily before breakfast.   loratadine 10 MG tablet Commonly known as: CLARITIN Take 1 tablet (10 mg total) by mouth daily. (for itching) What changed:   when to take this  reasons to take this  additional instructions   meclizine 25 MG tablet Commonly known as: ANTIVERT Take 25 mg by mouth 3 (three) times daily as needed for dizziness or nausea.   metoprolol tartrate 25 MG tablet Commonly known as: LOPRESSOR TAKE  (1)  TABLET TWICE A DAY. What changed: See the new instructions.   nitroGLYCERIN 0.4 MG SL tablet Commonly known as: NITROSTAT Place 1 tablet (0.4 mg total) under the tongue every 5 (five) minutes as needed for  chest pain.   pantoprazole 40 MG tablet  Commonly known as: PROTONIX Take 1 tablet (40 mg total) by mouth every evening. What changed: when to take this   PARoxetine 20 MG tablet Commonly known as: PAXIL TAKE (1) TABLET DAILY IN THE MORNING. What changed: See the new instructions.   rosuvastatin 40 MG tablet Commonly known as: CRESTOR TAKE (1/2) TABLET DAILY. What changed: See the new instructions.   torsemide 20 MG tablet Commonly known as: DEMADEX Take 1 tablet (20 mg total) by mouth daily as needed (edema).   Vitamin D 50 MCG (2000 UT) Caps Take 1 capsule by mouth daily.       Follow-up Information    Ronnie Doss M, DO. Go on 11/26/2019.   Specialty: Family Medicine Why: 11:00 appointment.  Contact information: [place] [address] [phone]        Home, [facility] Follow up.   Specialty: Home Health Services Why: Will contact you to schedule home health visits.  Contact information: [address] [place] [address] [phone]              Allergies  Allergen Reactions  . Sotalol Other (See Comments)    Torsades   . Bactrim [Sulfamethoxazole-Trimethoprim]     Itchy rash  . Ciprofloxacin Nausea Only  . Actos [Pioglitazone] Swelling  . Adhesive [Tape] Other (See Comments)    Causes sores  . Contrast Media [Iodinated Diagnostic Agents] Other (See Comments)    Headache (no action/pre-med required)  . Latex Rash  . Lipitor [Atorvastatin] Other (See Comments)    myalgia    Consultations:  GI   Procedures/Studies: DG Chest Portable 1 View  Result Date: 11/14/2019 CLINICAL DATA:  Chest pain EXAM: PORTABLE CHEST 1 VIEW COMPARISON:  Radiograph 07/24/2019 FINDINGS: Cardiac pacer/defibrillator device is again seen with leads in stable position towards the cardiac apex with additional lead seen projecting over the left upper quadrant. Some chronically coarsened interstitial changes and hyperexpansion are similar to  prior. No consolidation, features of edema, pneumothorax, or effusion. Stable cardiomediastinal contours with a calcified aorta. The osseous structures appear diffusely demineralized which may limit detection of small or nondisplaced fractures. No acute osseous or soft tissue abnormality. IMPRESSION: 1. No acute cardiopulmonary abnormality. 2. Stable chronically coarsened interstitial changes and hyperexpansion. Electronically Signed   By: [name] M.D.   On: 11/14/2019 22:33   ECHOCARDIOGRAM COMPLETE  Result Date: 11/20/2019    ECHOCARDIOGRAM REPORT   Patient Name:   MIREL HUNDAL Date of Exam: 11/20/2019 Medical Rec #:  [id number]        Height:       63.0 in Accession #:    [id number]       Weight:       139.3 lb Date of Birth:  11/15/1931        BSA:          1.658 m Patient Age:    65 years         BP:           189/78 mmHg Patient Gender: F                HR:           60 bpm. Exam Location:  [facility] Procedure: 2D Echo, Cardiac Doppler and Color Doppler Indications:    Chest Pain 786.50 / R07.9  History:        Patient has prior history of Echocardiogram examinations, most  recent 01/21/2019. CAD and Previous Myocardial Infarction,                 Arrythmias:Atrial Fibrillation; Risk Factors:Hypertension,                 Diabetes and Dyslipidemia. Chronic ischemic heart disease, Hx of                 COVID-19 virus infection, ICD (implantable                 cardioverter-defibrillator) in place, Long term current use of                 anticoagulant.  Sonographer:    Alvino Chapel RCS Referring Phys: [phone] Murad Staples IMPRESSIONS  1. Left ventricular ejection fraction, by estimation, is 50 to 55%. The left ventricle has normal function. The left ventricle has no regional wall motion abnormalities. There is mild left ventricular hypertrophy. Left ventricular diastolic parameters are consistent with Grade I diastolic dysfunction (impaired relaxation).  2. Right ventricular systolic function  is normal. The right ventricular size is normal. There is mildly elevated pulmonary artery systolic pressure.  3. Left atrial size was severely dilated.  4. Right atrial size was mildly dilated.  5. The mitral valve is normal in structure. Trivial mitral valve regurgitation. No evidence of mitral stenosis.  6. Tricuspid valve regurgitation is mild to moderate.  7. The aortic valve is tricuspid. Aortic valve regurgitation is mild.  8. The inferior vena cava is normal in size with greater than 50% respiratory variability, suggesting right atrial pressure of 3 mmHg. FINDINGS  Left Ventricle: Left ventricular ejection fraction, by estimation, is 50 to 55%. The left ventricle has normal function. The left ventricle has no regional wall motion abnormalities. The left ventricular internal cavity size was normal in size. There is  mild left ventricular hypertrophy. Left ventricular diastolic parameters are consistent with Grade I diastolic dysfunction (impaired relaxation). Normal left ventricular filling pressure. Right Ventricle: The right ventricular size is normal. No increase in right ventricular wall thickness. Right ventricular systolic function is normal. There is mildly elevated pulmonary artery systolic pressure. The tricuspid regurgitant velocity is 2.96  m/s, and with an assumed right atrial pressure of 3 mmHg, the estimated right ventricular systolic pressure is 83.1 mmHg. Left Atrium: Left atrial size was severely dilated. Right Atrium: Right atrial size was mildly dilated. Pericardium: There is no evidence of pericardial effusion. Mitral Valve: The mitral valve is normal in structure. There is mild thickening of the mitral valve leaflet(s). There is mild calcification of the mitral valve leaflet(s). Mild mitral annular calcification. Trivial mitral valve regurgitation. No evidence  of mitral valve stenosis. Tricuspid Valve: The tricuspid valve is normal in structure. Tricuspid valve regurgitation is mild to  moderate. No evidence of tricuspid stenosis. Aortic Valve: The aortic valve is tricuspid. Aortic valve regurgitation is mild. Aortic regurgitation PHT measures 741 msec. Aortic valve mean gradient measures 2.6 mmHg. Aortic valve peak gradient measures 5.7 mmHg. Aortic valve area, by VTI measures 3.52 cm. Pulmonic Valve: The pulmonic valve was not well visualized. Pulmonic valve regurgitation is mild. No evidence of pulmonic stenosis. Aorta: The aortic root is normal in size and structure. Pulmonary Artery: Mild pulmonary HTN, PASP is 38 mmHg. Venous: The inferior vena cava is normal in size with greater than 50% respiratory variability, suggesting right atrial pressure of 3 mmHg. IAS/Shunts: No atrial level shunt detected by color flow Doppler.  LEFT VENTRICLE PLAX 2D LVIDd:  4.29 cm     Diastology LVIDs:         3.45 cm     LV e' medial:    4.03 cm/s LV PW:         1.12 cm     LV E/e' medial:  13.0 LV IVS:        1.11 cm     LV e' lateral:   5.22 cm/s LVOT diam:     2.30 cm     LV E/e' lateral: 10.0 LV SV:         84 LV SV Index:   51 LVOT Area:     4.15 cm  LV Volumes (MOD) LV vol d, MOD A2C: 76.5 ml LV vol d, MOD A4C: 82.6 ml LV vol s, MOD A2C: 37.6 ml LV vol s, MOD A4C: 29.2 ml LV SV MOD A2C:     38.9 ml LV SV MOD A4C:     82.6 ml LV SV MOD BP:      47.6 ml RIGHT VENTRICLE RV S prime:     9.57 cm/s TAPSE (M-mode): 2.0 cm LEFT ATRIUM             Index       RIGHT ATRIUM           Index LA diam:        3.60 cm 2.17 cm/m  RA Area:     14.20 cm LA Vol (A2C):   77.2 ml 46.55 ml/m RA Volume:   33.50 ml  20.20 ml/m LA Vol (A4C):   72.4 ml 43.66 ml/m LA Biplane Vol: 74.7 ml 45.05 ml/m  AORTIC VALVE AV Area (Vmax):    3.21 cm AV Area (Vmean):   3.36 cm AV Area (VTI):     3.52 cm AV Vmax:           119.01 cm/s AV Vmean:          73.150 cm/s AV VTI:            0.240 m AV Peak Grad:      5.7 mmHg AV Mean Grad:      2.6 mmHg LVOT Vmax:         91.90 cm/s LVOT Vmean:        59.100 cm/s LVOT VTI:           0.203 m LVOT/AV VTI ratio: 0.85 AI PHT:            741 msec  AORTA Ao Root diam: 3.40 cm MITRAL VALVE               TRICUSPID VALVE MV Area (PHT): 3.72 cm    TR Peak grad:   35.0 mmHg MV Decel Time: 204 msec    TR Vmax:        296.00 cm/s MV E velocity: 52.40 cm/s MV A velocity: 84.00 cm/s  SHUNTS MV E/A ratio:  0.62        Systemic VTI:  0.20 m                            Systemic Diam: 2.30 cm Carlyle Dolly MD Electronically signed by Carlyle Dolly MD Signature Date/Time: 11/20/2019/4:00:05 PM    Final    CT Renal Stone Study  Result Date: 11/15/2019 CLINICAL DATA:  Lower abdominal pain with dizziness and hematuria. EXAM: CT ABDOMEN AND PELVIS WITHOUT CONTRAST TECHNIQUE: Multidetector CT imaging of the abdomen and pelvis  was performed following the standard protocol without IV contrast. COMPARISON:  July 25, 2019 FINDINGS: Lower chest: No acute abnormality. Hepatobiliary: No focal liver abnormality is seen. No gallstones, gallbladder wall thickening, or biliary dilatation. Pancreas: Unremarkable. No pancreatic ductal dilatation or surrounding inflammatory changes. Spleen: Normal in size without focal abnormality. Adrenals/Urinary Tract: Adrenal glands are unremarkable. The left kidney is asymmetrically small when compared to a normal size right kidney. A 1.4 cm x 0.8 cm area of low attenuation is seen within the anterior aspect of the lower pole of the right kidney. Numerous a subcentimeter non-obstructing renal stones of various sizes are again seen throughout both kidneys very mild bilateral hydronephrosis and hydroureter are seen without evidence of obstructing renal calculi. Bladder is unremarkable. Stomach/Bowel: There is a small hiatal hernia. Very mild appendiceal wall thickening is noted. No significant periappendiceal inflammatory fat stranding is seen. No evidence of bowel wall thickening, distention, or inflammatory changes. Vascular/Lymphatic: There is moderate to marked severity calcification  and tortuosity of the abdominal aorta and bilateral common iliac arteries, without evidence of aneurysmal dilatation. No enlarged abdominal or pelvic lymph nodes. Reproductive: Uterus and bilateral adnexa are unremarkable. Other: No abdominal wall hernia or abnormality. No abdominopelvic ascites. Musculoskeletal: There is moderate to marked severity levoscoliosis of the lumbar spine with marked severity degenerative changes seen throughout the lumbar spine. IMPRESSION: 1. Mild appendiceal wall thickening, without periappendiceal inflammatory fat stranding. Early appendicitis cannot completely be excluded. 2. Numerous bilateral subcentimeter non-obstructing renal stones. 3. Small hiatal hernia. 4. Moderate to marked severity levoscoliosis of the lumbar spine with marked severity degenerative changes throughout the lumbar spine. 5. Aortic atherosclerosis. Aortic Atherosclerosis (ICD10-I70.0). Electronically Signed   By: Virgina Norfolk M.D.   On: 11/15/2019 01:50   CT Angio Abd/Pel w/ and/or w/o  Result Date: 11/18/2019 CLINICAL DATA:  Abdominal pain with decreased appetite. Evaluate for chronic mesenteric ischemia. EXAM: CTA ABDOMEN AND PELVIS  WITH CONTRAST TECHNIQUE: Multidetector CT imaging of the abdomen and pelvis was performed using the standard protocol during bolus administration of intravenous contrast. Multiplanar reconstructed images and MIPs were obtained and reviewed to evaluate the vascular anatomy. CONTRAST:  85mL OMNIPAQUE IOHEXOL 350 MG/ML SOLN COMPARISON:  CT renal stone protocol 11/15/2019 and 01/11/2019 FINDINGS: VASCULAR Aorta: Diffuse atherosclerotic disease involving the abdominal aorta. Abdominal aorta is tortuous and related to the levoscoliosis. Again noted is a focal dilatation in the infrarenal aorta just beyond the takeoff of the IMA. This focal dilatation measures up to 2.4 cm and not significantly changed since 03/13/2018. In addition, there is an intimal flap along the left side  of the abdominal aorta at this area of dilatation. This is unchanged from the exam in 2020 and could represent a focal dissection versus penetrating ulcer. No significant aortic stenosis. Celiac: Patent without evidence of aneurysm, dissection, vasculitis or significant stenosis. SMA: Mild narrowing at the origin of the SMA. The stenosis does not appear to be hemodynamically significant. Main SMA branches are widely patent. No evidence for aneurysm or dissection. Renals: Calcified plaque involving the origin of the left renal artery and findings are suggestive for high-grade stenosis and near occlusion of the left renal artery. Right renal artery is widely patent at the origin but has a beaded appearance, best seen on the coronal reformats, sequence 7, image 87. Findings are compatible with fibromuscular dysplasia and there was a similar finding on the previous examination. IMA: Patent Inflow: Bilateral common and external iliac arteries are widely patent without significant stenosis. Bilateral internal iliac  arteries are patent with narrowing involving the left internal iliac artery. Proximal Outflow: Proximal femoral arteries are patent bilaterally. Veins: Portal venous system is patent. No gross abnormality to the IVC or iliac veins. Hepatic veins are patent. Review of the MIP images confirms the above findings. NON-VASCULAR Lower chest: Defibrillator leads extending into the right heart. Peripheral 5 mm density in the right lower lobe on sequence 11, image 17 has minimally changed since 2013 and suggestive for a benign nodule. Motion artifact at the lung bases. No significant pleural effusions. Hepatobiliary: Normal appearance of the liver, gallbladder and portal venous system. No biliary dilatation Pancreas: Round low-density structure at the pancreatic neck region measures up to 8 mm on sequence 4, image 66. This is suggestive for small cystic lesion and unchanged since 2020. No significant pancreatic  dilatation. No pancreatic inflammation or stranding. Spleen: Normal in size without focal abnormality. Adrenals/Urinary Tract: Adrenal glands are within normal limits. Left kidney is atrophy and likely associated with the high-grade left renal artery stenosis. Large cortical calcification involving the left kidney lower pole. Scattered calcifications in both kidneys. 7 mm stone in the right kidney lower pole. 8 mm stone in the right kidney interpolar region. Small right renal cysts. No suspicious renal lesions. No hydronephrosis. Urinary bladder is unremarkable. Stomach/Bowel: Mild wall thickening in the distal stomach is nonspecific. No inflammatory changes around the duodenum or stomach. Fluid-filled colon. No focal bowel inflammation and no evidence for bowel obstruction. Appendix contains fluid but no surrounding inflammatory changes. No significant small bowel dilatation. Lymphatic: No abdominopelvic lymphadenopathy. Reproductive: Uterus and bilateral adnexa are unremarkable. Other: Negative for ascites.  Negative for free air. Musculoskeletal: Again noted are multiple cardiac leads in the anterior abdominal subcutaneous tissues. Levoscoliosis of the lumbar spine. Stable old compression deformity involving the T12 vertebral body. Multilevel degenerative changes in the lumbar spine. IMPRESSION: VASCULAR 1. No evidence for chronic mesenteric ischemia. The main mesenteric arteries are patent without significant stenosis. 2. Stable appearance of the abdominal aorta with focal dilatation in the infrarenal abdominal aorta associated with a focal dissection or penetrating ulcer. 3. Fibromuscular dysplasia involving the right renal artery. 4. Critical stenosis involving the left renal artery with associated left renal atrophy. NON-VASCULAR 1. No acute abnormality abdomen or pelvis. 2. Stable 8 mm low-density structure in the pancreatic head/neck region. This probably represents a small cystic lesion and could represent  an IPMN. Recommend follow up pre and post contrast MRI/MRCP or pancreatic protocol CT in 2 years. This recommendation follows ACR consensus guidelines: Management of Incidental Pancreatic Cysts: A White Paper of the ACR Incidental Findings Committee. J Am Coll Radiol 7209;[id number]. 3. Bilateral renal calculi.  No hydronephrosis. Electronically Signed   By: Markus Daft M.D.   On: 11/18/2019 10:46   US Abdomen Limited RUQ (LIVER/GB)  Result Date: 11/21/2019 CLINICAL DATA:  RIGHT upper quadrant abdominal pain EXAM: ULTRASOUND ABDOMEN LIMITED RIGHT UPPER QUADRANT COMPARISON:  CT abdomen and pelvis 11/18/2019 FINDINGS: Gallbladder: Normally distended without stones or wall thickening. No pericholecystic fluid or sonographic Murphy sign. Common bile duct: Diameter: 4 mm, normal Liver: Coarsened mildly increased hepatic echogenicity likely accentuated by body habitus, likely representing fatty infiltration. No definite hepatic mass or nodularity identified. Portal vein is patent on color Doppler imaging with normal direction of blood flow towards the liver. Other: No RIGHT upper quadrant free fluid. Marked cortical thinning and increased cortical echogenicity of the RIGHT kidney. IMPRESSION: Probable fatty infiltration of liver as above. RIGHT renal cortical atrophy. Electronically Signed  By: Lavonia Dana M.D.   On: 11/21/2019 10:21         Discharge Exam: Vitals:   11/21/19 2103 11/22/19 0515  BP: (!) 167/81 (!) 142/70  Pulse: 67 66  Resp: 16 15  Temp: 98.5 F (36.9 C) 97.9 F (36.6 C)  SpO2: 97% 96%   Vitals:   11/21/19 1438 11/21/19 2007 11/21/19 2103 11/22/19 0515  BP: (!) 151/68  (!) 167/81 (!) 142/70  Pulse: 65  67 66  Resp: 17  16 15   Temp: 97.6 F (36.4 C)  98.5 F (36.9 C) 97.9 F (36.6 C)  TempSrc: Oral  Oral Oral  SpO2: 98% 97% 97% 96%  Weight:      Height:        General: Pt is alert, awake, not in acute distress Cardiovascular: RRR, S1/S2 +, no rubs, no  gallops Respiratory: CTA bilaterally, no wheezing, no rhonchi Abdominal: Soft, NT, ND, bowel sounds + Extremities: no edema, no cyanosis   The results of significant diagnostics from this hospitalization (including imaging, microbiology, ancillary and laboratory) are listed below for reference.    Significant Diagnostic Studies: DG Chest Portable 1 View  Result Date: 11/14/2019 CLINICAL DATA:  Chest pain EXAM: PORTABLE CHEST 1 VIEW COMPARISON:  Radiograph 07/24/2019 FINDINGS: Cardiac pacer/defibrillator device is again seen with leads in stable position towards the cardiac apex with additional lead seen projecting over the left upper quadrant. Some chronically coarsened interstitial changes and hyperexpansion are similar to prior. No consolidation, features of edema, pneumothorax, or effusion. Stable cardiomediastinal contours with a calcified aorta. The osseous structures appear diffusely demineralized which may limit detection of small or nondisplaced fractures. No acute osseous or soft tissue abnormality. IMPRESSION: 1. No acute cardiopulmonary abnormality. 2. Stable chronically coarsened interstitial changes and hyperexpansion. Electronically Signed   By: [name] M.D.   On: 11/14/2019 22:33   ECHOCARDIOGRAM COMPLETE  Result Date: 11/20/2019    ECHOCARDIOGRAM REPORT   Patient Name:   LINNAE RASOOL Date of Exam: 11/20/2019 Medical Rec #:  [id number]        Height:       63.0 in Accession #:    [id number]       Weight:       139.3 lb Date of Birth:  Nov 07, 1931        BSA:          1.658 m Patient Age:    25 years         BP:           189/78 mmHg Patient Gender: F                HR:           60 bpm. Exam Location:  [facility] Procedure: 2D Echo, Cardiac Doppler and Color Doppler Indications:    Chest Pain 786.50 / R07.9  History:        Patient has prior history of Echocardiogram examinations, most                 recent 01/21/2019. CAD and Previous Myocardial Infarction,                  Arrythmias:Atrial Fibrillation; Risk Factors:Hypertension,                 Diabetes and Dyslipidemia. Chronic ischemic heart disease, Hx of                 COVID-19 virus infection, ICD (implantable  cardioverter-defibrillator) in place, Long term current use of                 anticoagulant.  Sonographer:    Alvino Chapel RCS Referring Phys: [phone] Nashira Mcglynn IMPRESSIONS  1. Left ventricular ejection fraction, by estimation, is 50 to 55%. The left ventricle has normal function. The left ventricle has no regional wall motion abnormalities. There is mild left ventricular hypertrophy. Left ventricular diastolic parameters are consistent with Grade I diastolic dysfunction (impaired relaxation).  2. Right ventricular systolic function is normal. The right ventricular size is normal. There is mildly elevated pulmonary artery systolic pressure.  3. Left atrial size was severely dilated.  4. Right atrial size was mildly dilated.  5. The mitral valve is normal in structure. Trivial mitral valve regurgitation. No evidence of mitral stenosis.  6. Tricuspid valve regurgitation is mild to moderate.  7. The aortic valve is tricuspid. Aortic valve regurgitation is mild.  8. The inferior vena cava is normal in size with greater than 50% respiratory variability, suggesting right atrial pressure of 3 mmHg. FINDINGS  Left Ventricle: Left ventricular ejection fraction, by estimation, is 50 to 55%. The left ventricle has normal function. The left ventricle has no regional wall motion abnormalities. The left ventricular internal cavity size was normal in size. There is  mild left ventricular hypertrophy. Left ventricular diastolic parameters are consistent with Grade I diastolic dysfunction (impaired relaxation). Normal left ventricular filling pressure. Right Ventricle: The right ventricular size is normal. No increase in right ventricular wall thickness. Right ventricular systolic function is normal. There is mildly  elevated pulmonary artery systolic pressure. The tricuspid regurgitant velocity is 2.96  m/s, and with an assumed right atrial pressure of 3 mmHg, the estimated right ventricular systolic pressure is 23.7 mmHg. Left Atrium: Left atrial size was severely dilated. Right Atrium: Right atrial size was mildly dilated. Pericardium: There is no evidence of pericardial effusion. Mitral Valve: The mitral valve is normal in structure. There is mild thickening of the mitral valve leaflet(s). There is mild calcification of the mitral valve leaflet(s). Mild mitral annular calcification. Trivial mitral valve regurgitation. No evidence  of mitral valve stenosis. Tricuspid Valve: The tricuspid valve is normal in structure. Tricuspid valve regurgitation is mild to moderate. No evidence of tricuspid stenosis. Aortic Valve: The aortic valve is tricuspid. Aortic valve regurgitation is mild. Aortic regurgitation PHT measures 741 msec. Aortic valve mean gradient measures 2.6 mmHg. Aortic valve peak gradient measures 5.7 mmHg. Aortic valve area, by VTI measures 3.52 cm. Pulmonic Valve: The pulmonic valve was not well visualized. Pulmonic valve regurgitation is mild. No evidence of pulmonic stenosis. Aorta: The aortic root is normal in size and structure. Pulmonary Artery: Mild pulmonary HTN, PASP is 38 mmHg. Venous: The inferior vena cava is normal in size with greater than 50% respiratory variability, suggesting right atrial pressure of 3 mmHg. IAS/Shunts: No atrial level shunt detected by color flow Doppler.  LEFT VENTRICLE PLAX 2D LVIDd:         4.29 cm     Diastology LVIDs:         3.45 cm     LV e' medial:    4.03 cm/s LV PW:         1.12 cm     LV E/e' medial:  13.0 LV IVS:        1.11 cm     LV e' lateral:   5.22 cm/s LVOT diam:     2.30 cm  LV E/e' lateral: 10.0 LV SV:         84 LV SV Index:   51 LVOT Area:     4.15 cm  LV Volumes (MOD) LV vol d, MOD A2C: 76.5 ml LV vol d, MOD A4C: 82.6 ml LV vol s, MOD A2C: 37.6 ml LV vol  s, MOD A4C: 29.2 ml LV SV MOD A2C:     38.9 ml LV SV MOD A4C:     82.6 ml LV SV MOD BP:      47.6 ml RIGHT VENTRICLE RV S prime:     9.57 cm/s TAPSE (M-mode): 2.0 cm LEFT ATRIUM             Index       RIGHT ATRIUM           Index LA diam:        3.60 cm 2.17 cm/m  RA Area:     14.20 cm LA Vol (A2C):   77.2 ml 46.55 ml/m RA Volume:   33.50 ml  20.20 ml/m LA Vol (A4C):   72.4 ml 43.66 ml/m LA Biplane Vol: 74.7 ml 45.05 ml/m  AORTIC VALVE AV Area (Vmax):    3.21 cm AV Area (Vmean):   3.36 cm AV Area (VTI):     3.52 cm AV Vmax:           119.01 cm/s AV Vmean:          73.150 cm/s AV VTI:            0.240 m AV Peak Grad:      5.7 mmHg AV Mean Grad:      2.6 mmHg LVOT Vmax:         91.90 cm/s LVOT Vmean:        59.100 cm/s LVOT VTI:          0.203 m LVOT/AV VTI ratio: 0.85 AI PHT:            741 msec  AORTA Ao Root diam: 3.40 cm MITRAL VALVE               TRICUSPID VALVE MV Area (PHT): 3.72 cm    TR Peak grad:   35.0 mmHg MV Decel Time: 204 msec    TR Vmax:        296.00 cm/s MV E velocity: 52.40 cm/s MV A velocity: 84.00 cm/s  SHUNTS MV E/A ratio:  0.62        Systemic VTI:  0.20 m                            Systemic Diam: 2.30 cm Carlyle Dolly MD Electronically signed by Carlyle Dolly MD Signature Date/Time: 11/20/2019/4:00:05 PM    Final    CT Renal Stone Study  Result Date: 11/15/2019 CLINICAL DATA:  Lower abdominal pain with dizziness and hematuria. EXAM: CT ABDOMEN AND PELVIS WITHOUT CONTRAST TECHNIQUE: Multidetector CT imaging of the abdomen and pelvis was performed following the standard protocol without IV contrast. COMPARISON:  July 25, 2019 FINDINGS: Lower chest: No acute abnormality. Hepatobiliary: No focal liver abnormality is seen. No gallstones, gallbladder wall thickening, or biliary dilatation. Pancreas: Unremarkable. No pancreatic ductal dilatation or surrounding inflammatory changes. Spleen: Normal in size without focal abnormality. Adrenals/Urinary Tract: Adrenal glands are  unremarkable. The left kidney is asymmetrically small when compared to a normal size right kidney. A 1.4 cm x 0.8 cm area of low attenuation is seen  within the anterior aspect of the lower pole of the right kidney. Numerous a subcentimeter non-obstructing renal stones of various sizes are again seen throughout both kidneys very mild bilateral hydronephrosis and hydroureter are seen without evidence of obstructing renal calculi. Bladder is unremarkable. Stomach/Bowel: There is a small hiatal hernia. Very mild appendiceal wall thickening is noted. No significant periappendiceal inflammatory fat stranding is seen. No evidence of bowel wall thickening, distention, or inflammatory changes. Vascular/Lymphatic: There is moderate to marked severity calcification and tortuosity of the abdominal aorta and bilateral common iliac arteries, without evidence of aneurysmal dilatation. No enlarged abdominal or pelvic lymph nodes. Reproductive: Uterus and bilateral adnexa are unremarkable. Other: No abdominal wall hernia or abnormality. No abdominopelvic ascites. Musculoskeletal: There is moderate to marked severity levoscoliosis of the lumbar spine with marked severity degenerative changes seen throughout the lumbar spine. IMPRESSION: 1. Mild appendiceal wall thickening, without periappendiceal inflammatory fat stranding. Early appendicitis cannot completely be excluded. 2. Numerous bilateral subcentimeter non-obstructing renal stones. 3. Small hiatal hernia. 4. Moderate to marked severity levoscoliosis of the lumbar spine with marked severity degenerative changes throughout the lumbar spine. 5. Aortic atherosclerosis. Aortic Atherosclerosis (ICD10-I70.0). Electronically Signed   By: Virgina Norfolk M.D.   On: 11/15/2019 01:50   CT Angio Abd/Pel w/ and/or w/o  Result Date: 11/18/2019 CLINICAL DATA:  Abdominal pain with decreased appetite. Evaluate for chronic mesenteric ischemia. EXAM: CTA ABDOMEN AND PELVIS  WITH CONTRAST  TECHNIQUE: Multidetector CT imaging of the abdomen and pelvis was performed using the standard protocol during bolus administration of intravenous contrast. Multiplanar reconstructed images and MIPs were obtained and reviewed to evaluate the vascular anatomy. CONTRAST:  23mL OMNIPAQUE IOHEXOL 350 MG/ML SOLN COMPARISON:  CT renal stone protocol 11/15/2019 and 01/11/2019 FINDINGS: VASCULAR Aorta: Diffuse atherosclerotic disease involving the abdominal aorta. Abdominal aorta is tortuous and related to the levoscoliosis. Again noted is a focal dilatation in the infrarenal aorta just beyond the takeoff of the IMA. This focal dilatation measures up to 2.4 cm and not significantly changed since 03/13/2018. In addition, there is an intimal flap along the left side of the abdominal aorta at this area of dilatation. This is unchanged from the exam in 2020 and could represent a focal dissection versus penetrating ulcer. No significant aortic stenosis. Celiac: Patent without evidence of aneurysm, dissection, vasculitis or significant stenosis. SMA: Mild narrowing at the origin of the SMA. The stenosis does not appear to be hemodynamically significant. Main SMA branches are widely patent. No evidence for aneurysm or dissection. Renals: Calcified plaque involving the origin of the left renal artery and findings are suggestive for high-grade stenosis and near occlusion of the left renal artery. Right renal artery is widely patent at the origin but has a beaded appearance, best seen on the coronal reformats, sequence 7, image 87. Findings are compatible with fibromuscular dysplasia and there was a similar finding on the previous examination. IMA: Patent Inflow: Bilateral common and external iliac arteries are widely patent without significant stenosis. Bilateral internal iliac arteries are patent with narrowing involving the left internal iliac artery. Proximal Outflow: Proximal femoral arteries are patent bilaterally. Veins:  Portal venous system is patent. No gross abnormality to the IVC or iliac veins. Hepatic veins are patent. Review of the MIP images confirms the above findings. NON-VASCULAR Lower chest: Defibrillator leads extending into the right heart. Peripheral 5 mm density in the right lower lobe on sequence 11, image 17 has minimally changed since 2013 and suggestive for a benign nodule. Motion artifact  at the lung bases. No significant pleural effusions. Hepatobiliary: Normal appearance of the liver, gallbladder and portal venous system. No biliary dilatation Pancreas: Round low-density structure at the pancreatic neck region measures up to 8 mm on sequence 4, image 66. This is suggestive for small cystic lesion and unchanged since 2020. No significant pancreatic dilatation. No pancreatic inflammation or stranding. Spleen: Normal in size without focal abnormality. Adrenals/Urinary Tract: Adrenal glands are within normal limits. Left kidney is atrophy and likely associated with the high-grade left renal artery stenosis. Large cortical calcification involving the left kidney lower pole. Scattered calcifications in both kidneys. 7 mm stone in the right kidney lower pole. 8 mm stone in the right kidney interpolar region. Small right renal cysts. No suspicious renal lesions. No hydronephrosis. Urinary bladder is unremarkable. Stomach/Bowel: Mild wall thickening in the distal stomach is nonspecific. No inflammatory changes around the duodenum or stomach. Fluid-filled colon. No focal bowel inflammation and no evidence for bowel obstruction. Appendix contains fluid but no surrounding inflammatory changes. No significant small bowel dilatation. Lymphatic: No abdominopelvic lymphadenopathy. Reproductive: Uterus and bilateral adnexa are unremarkable. Other: Negative for ascites.  Negative for free air. Musculoskeletal: Again noted are multiple cardiac leads in the anterior abdominal subcutaneous tissues. Levoscoliosis of the lumbar  spine. Stable old compression deformity involving the T12 vertebral body. Multilevel degenerative changes in the lumbar spine. IMPRESSION: VASCULAR 1. No evidence for chronic mesenteric ischemia. The main mesenteric arteries are patent without significant stenosis. 2. Stable appearance of the abdominal aorta with focal dilatation in the infrarenal abdominal aorta associated with a focal dissection or penetrating ulcer. 3. Fibromuscular dysplasia involving the right renal artery. 4. Critical stenosis involving the left renal artery with associated left renal atrophy. NON-VASCULAR 1. No acute abnormality abdomen or pelvis. 2. Stable 8 mm low-density structure in the pancreatic head/neck region. This probably represents a small cystic lesion and could represent an IPMN. Recommend follow up pre and post contrast MRI/MRCP or pancreatic protocol CT in 2 years. This recommendation follows ACR consensus guidelines: Management of Incidental Pancreatic Cysts: A White Paper of the ACR Incidental Findings Committee. J Am Coll Radiol 2423;53:614-431. 3. Bilateral renal calculi.  No hydronephrosis. Electronically Signed   By: Markus Daft M.D.   On: 11/18/2019 10:46   US Abdomen Limited RUQ (LIVER/GB)  Result Date: 11/21/2019 CLINICAL DATA:  RIGHT upper quadrant abdominal pain EXAM: ULTRASOUND ABDOMEN LIMITED RIGHT UPPER QUADRANT COMPARISON:  CT abdomen and pelvis 11/18/2019 FINDINGS: Gallbladder: Normally distended without stones or wall thickening. No pericholecystic fluid or sonographic Murphy sign. Common bile duct: Diameter: 4 mm, normal Liver: Coarsened mildly increased hepatic echogenicity likely accentuated by body habitus, likely representing fatty infiltration. No definite hepatic mass or nodularity identified. Portal vein is patent on color Doppler imaging with normal direction of blood flow towards the liver. Other: No RIGHT upper quadrant free fluid. Marked cortical thinning and increased cortical echogenicity of  the RIGHT kidney. IMPRESSION: Probable fatty infiltration of liver as above. RIGHT renal cortical atrophy. Electronically Signed   By: Lavonia Dana M.D.   On: 11/21/2019 10:21     Microbiology: Recent Results (from the past 240 hour(s))  Respiratory Panel by RT PCR (Flu A&B, Covid) - Nasopharyngeal Swab     Status: None   Collection Time: 11/15/19  3:13 AM   Specimen: Nasopharyngeal Swab  Result Value Ref Range Status   SARS Coronavirus 2 by RT PCR NEGATIVE NEGATIVE Final    Comment: (NOTE) SARS-CoV-2 target nucleic acids are NOT DETECTED.  The SARS-CoV-2 RNA is generally detectable in upper respiratoy specimens during the acute phase of infection. The lowest concentration of SARS-CoV-2 viral copies this assay can detect is 131 copies/mL. A negative result does not preclude SARS-Cov-2 infection and should not be used as the sole basis for treatment or other patient management decisions. A negative result may occur with  improper specimen collection/handling, submission of specimen other than nasopharyngeal swab, presence of viral mutation(s) within the areas targeted by this assay, and inadequate number of viral copies (<131 copies/mL). A negative result must be combined with clinical observations, patient history, and epidemiological information. The expected result is Negative.  Fact Sheet for Patients:  PinkCheek.be  Fact Sheet for Healthcare Providers:  GravelBags.it  This test is no t yet approved or cleared by the [place] FDA and  has been authorized for detection and/or diagnosis of SARS-CoV-2 by FDA under an Emergency Use Authorization (EUA). This EUA will remain  in effect (meaning this test can be used) for the duration of the COVID-19 declaration under Section 564(b)(1) of the Act, 21 U.S.C. section 360bbb-3(b)(1), unless the authorization is terminated or revoked sooner.     Influenza A by PCR NEGATIVE  NEGATIVE Final   Influenza B by PCR NEGATIVE NEGATIVE Final    Comment: (NOTE) The Xpert Xpress SARS-CoV-2/FLU/RSV assay is intended as an aid in  the diagnosis of influenza from Nasopharyngeal swab specimens and  should not be used as a sole basis for treatment. Nasal washings and  aspirates are unacceptable for Xpert Xpress SARS-CoV-2/FLU/RSV  testing.  Fact Sheet for Patients: PinkCheek.be  Fact Sheet for Healthcare Providers: GravelBags.it  This test is not yet approved or cleared by the [place] FDA and  has been authorized for detection and/or diagnosis of SARS-CoV-2 by  FDA under an Emergency Use Authorization (EUA). This EUA will remain  in effect (meaning this test can be used) for the duration of the  Covid-19 declaration under Section 564(b)(1) of the Act, 21  U.S.C. section 360bbb-3(b)(1), unless the authorization is  terminated or revoked. Performed at [facility], [address] [place], [place] [address]      Labs: Basic Metabolic Panel: Recent Labs  Lab 11/18/19 0550 11/18/19 0550 11/19/19 0438 11/20/19 0435  NA 137  --  135 136  K 3.0*   < > 4.7 3.5  CL 107  --  107 110  CO2 22  --  21* 19*  GLUCOSE 110*  --  97 94  BUN 13  --  18 21  CREATININE 1.11*  --  1.28* 1.29*  CALCIUM 8.6*  --  9.0 8.7*  MG  --   --  1.8 1.8   < > = values in this interval not displayed.   Liver Function Tests: Recent Labs  Lab 11/20/19 0435  AST 40  ALT 31  ALKPHOS 55  BILITOT 0.7  PROT 5.8*  ALBUMIN 3.1*   Recent Labs  Lab 11/20/19 0435  LIPASE 29   No results for input(s): AMMONIA in the last 168 hours. CBC: Recent Labs  Lab 11/18/19 0550  WBC 5.6  HGB 11.3*  HCT 35.2*  MCV 97.8  PLT 233   Cardiac Enzymes: No results for input(s): CKTOTAL, CKMB, CKMBINDEX, TROPONINI in the last 168 hours. BNP: Invalid input(s): POCBNP CBG: Recent Labs  Lab 11/21/19 1130 11/21/19 1625  11/22/19 0511 11/22/19 0734 11/22/19 1113  GLUCAP 116* 119* 103* 98 149*    Time coordinating discharge:  36 minutes  Signed:  Orson Eva, DO [facility] Pager: [phone] 11/24/2019, 5:42 PM

## 2019-11-26 ENCOUNTER — Telehealth: Payer: Self-pay | Admitting: Gastroenterology

## 2019-11-26 ENCOUNTER — Other Ambulatory Visit: Payer: Self-pay

## 2019-11-26 ENCOUNTER — Ambulatory Visit (INDEPENDENT_AMBULATORY_CARE_PROVIDER_SITE_OTHER): Payer: Medicare Other | Admitting: Family Medicine

## 2019-11-26 ENCOUNTER — Encounter: Payer: Self-pay | Admitting: Family Medicine

## 2019-11-26 VITALS — BP 138/71 | HR 50 | Temp 97.1°F | Ht 63.0 in | Wt 138.0 lb

## 2019-11-26 DIAGNOSIS — I773 Arterial fibromuscular dysplasia: Secondary | ICD-10-CM | POA: Diagnosis not present

## 2019-11-26 DIAGNOSIS — I701 Atherosclerosis of renal artery: Secondary | ICD-10-CM | POA: Diagnosis not present

## 2019-11-26 DIAGNOSIS — K869 Disease of pancreas, unspecified: Secondary | ICD-10-CM | POA: Insufficient documentation

## 2019-11-26 DIAGNOSIS — M1711 Unilateral primary osteoarthritis, right knee: Secondary | ICD-10-CM

## 2019-11-26 DIAGNOSIS — R5381 Other malaise: Secondary | ICD-10-CM

## 2019-11-26 DIAGNOSIS — R935 Abnormal findings on diagnostic imaging of other abdominal regions, including retroperitoneum: Secondary | ICD-10-CM

## 2019-11-26 DIAGNOSIS — R300 Dysuria: Secondary | ICD-10-CM

## 2019-11-26 DIAGNOSIS — R531 Weakness: Secondary | ICD-10-CM

## 2019-11-26 DIAGNOSIS — R109 Unspecified abdominal pain: Secondary | ICD-10-CM

## 2019-11-26 DIAGNOSIS — Z09 Encounter for follow-up examination after completed treatment for conditions other than malignant neoplasm: Secondary | ICD-10-CM

## 2019-11-26 LAB — URINALYSIS
Bilirubin, UA: NEGATIVE
Glucose, UA: NEGATIVE
Ketones, UA: NEGATIVE
Nitrite, UA: NEGATIVE
Protein,UA: NEGATIVE
RBC, UA: NEGATIVE
Specific Gravity, UA: 1.01 (ref 1.005–1.030)
Urobilinogen, Ur: 0.2 mg/dL (ref 0.2–1.0)
pH, UA: 6.5 (ref 5.0–7.5)

## 2019-11-26 MED ORDER — LINACLOTIDE 145 MCG PO CAPS: 145.0000 ug | ORAL_CAPSULE | Freq: Every day | ORAL | 0 refills | Status: DC

## 2019-11-26 NOTE — Progress Notes (Signed)
Subjective: CC: Hospital discharge PCP: [name], DO [name] is a 86 y.o. female presenting to clinic today for:  1.  Hospital discharge Patient was hospitalized for abdominal pain and weakness.  She had no evidence of infection or pancreatitis on admission.  There was concern for possible mesenteric ischemia versus acute appendicitis initially.  Imaging was remarkable for patent mesenteric arteries on CT abdomen, fatty liver on right upper quadrant sound with renal atrophy on the right.  She did have focal dilatation of the infrarenal abdominal aorta with a focal dissection or penetrating ulcer.  This was reviewed with the vascular surgery who thought that the area was chronic and did not need further intervention.  She had EGD performed which showed mild Schatzki's ring but a normal stomach and duodenum.  Her hyponatremia improved with IV fluids as did her acute on chronic renal injury.  She is accompanied today by her daughter-in-law, [name], whom she is staying with.  Home health physical therapy has been ordered through [facility] but they are not able to start until next week due to short staffing.  She overall continues to feel weak and her daughter-in-law reports her sleeping up to 20 hours/day since discharge from the hospital.  She has had bladder pressure like she needs to urinate but is not having much urine output despite adequate hydration throughout the day.  Additionally, she reports left-sided abdominal pain that radiates into the left upper back.  No fevers, nausea or vomiting.  She is eating small meals but is not getting many calories in per day.  They are trying to keep her meals bland.  She is not really had any bowel movements but feels the need to defecate.  She had good response to Linzess in the hospital but was not discharged home with this.  She does not have follow-up with GI until 20 December and will be seeing Aliene Altes, PA-C.  She would  like to see GI sooner but has not been able to get anything sooner than this December appointment.  She is established with Dr. Jeffie Pollock but there are no plans to follow-up with him with regards to the nonobstructing kidney stones.   ROS: Per HPI  Allergies  Allergen Reactions   Sotalol Other (See Comments)    Torsades    Bactrim [Sulfamethoxazole-Trimethoprim]     Itchy rash   Ciprofloxacin Nausea Only   Actos [Pioglitazone] Swelling   Adhesive [Tape] Other (See Comments)    Causes sores   Contrast Media [Iodinated Diagnostic Agents] Other (See Comments)    Headache (no action/pre-med required)   Latex Rash   Lipitor [Atorvastatin] Other (See Comments)    myalgia   Past Medical History:  Diagnosis Date   AICD (automatic cardioverter/defibrillator) present    Allergy    SESONAL   Anxiety    ARTHRITIS    Arthritis    Atrial fibrillation (HCC)    CAD (coronary artery disease)    a. history of cardiac arrest 1993;  b. s/p LAD/LCX stenting in 2003;  c. 07/2011 Cath: LM nl, LAD patent stent, D1 80ost, LCX patent stent, RCA min irregs;  d. 04/2012 MV: EF 66%, no ishcemia;  e. 12/2013 Echo: Ef 55%, no rwma, Gr 1 DD, triv AI/MR, mildly dil LA;  f. 12/2013 Lexi MV: intermediate risk - apical ischemia and inf/infsept fixed defect, ? artifactual.   CAD in native artery 12/31/2013   Previous stents to LAD and Ramus patent on cath today 12/31/13  also There is severe disease in the ostial first diagonal which is unchanged from most recent cardiac catheterization. The right coronary artery could not be engaged selectively but nonselective angiography showed no significant disease in the proximal and midsegment.      Cataract    DENIES   Chronic back pain    Chronic sinus bradycardia    CKD (chronic kidney disease), stage III (HCC)    Clotting disorder ([place])    DVT   COLITIS 12/02/2007   Qualifier: Diagnosis of  By: [name], [name]     Diabetes mellitus  without complication ([place])    DENIES   Diverticulosis of colon (without mention of hemorrhage) 2007   Colonoscopy    Esophageal stricture    a. 2012 s/p dil.   Esophagitis, unspecified    a. 2012 EGD   EXTERNAL HEMORRHOIDS    GERD (gastroesophageal reflux disease)    omeprazole   H/O hiatal hernia    Hiatal hernia    a. 2012 EGD.   History of DVT in the past, not on Coumadin now    left  leg   HYPERCHOLESTEROLEMIA    Hypertension    ICD (implantable cardiac defibrillator) in place    a. s/p initial ICD in 1993 in setting of cardiac arrest;  b. 01/2007 gen change: Guidant T135 Vitality DS VR single lead ICD.   Macular degeneration    gets injecton in eye every 5 weeks- last injection - 05/03/2013    Myocardial infarction [facility]) 1993   Near syncope 05/22/2015   Nephrolithiasis, just saw Dr [name]    Orthostatic hypotension    Peripheral vascular disease ([place])    ???   RECTAL BLEEDING 12/03/2007   Qualifier: Diagnosis of  By: [name]    Unspecified gastritis and gastroduodenitis without mention of hemorrhage    a. 2003 EGD->not noted on 2012 EGD.   Unstable angina, neg MI, cath stable.maybe GI 12/30/2013    Current Outpatient Medications:    amiodarone (PACERONE) 200 MG tablet, TAKE 1 TABLET DAILY, Disp: 30 tablet, Rfl: 3   amitriptyline (ELAVIL) 10 MG tablet, Take 1 tablet (10 mg total) by mouth at bedtime., Disp: 30 tablet, Rfl: 1   apixaban (ELIQUIS) 5 MG TABS tablet, Take 1 tablet (5 mg total) by mouth 2 (two) times daily., Disp: 180 tablet, Rfl: 1   carboxymethylcellulose (REFRESH PLUS) 0.5 % SOLN, Place 1 drop into both eyes 3 (three) times daily as needed (dry/irritated eyes.)., Disp: , Rfl:    Cholecalciferol (VITAMIN D) 50 MCG (2000 UT) CAPS, Take 1 capsule by mouth daily., Disp: , Rfl:    diphenoxylate-atropine (LOMOTIL) 2.5-0.025 MG tablet, TAKE 1 TABLET EVERY 8 HOURS AS NEEDED FOR DIARRHEA (Patient taking differently: Take  1 tablet by mouth every 8 (eight) hours as needed for diarrhea or loose stools. ), Disp: 30 tablet, Rfl: 0   hydrALAZINE (APRESOLINE) 10 MG tablet, TAKE 1 TABLET EVERY 8 HOURS (Patient taking differently: Take 10 mg by mouth every 8 (eight) hours. ), Disp: 90 tablet, Rfl: 4   hydrocortisone (ANUSOL-HC) 25 MG suppository, Place 1 suppository (25 mg total) rectally 2 (two) times daily., Disp: 10 suppository, Rfl: 0   isosorbide mononitrate (IMDUR) 30 MG 24 hr tablet, TAKE ONE HALF (1/2) TABLET DAILY (Patient taking differently: Take 15 mg by mouth daily. ), Disp: 90 tablet, Rfl: 2   levothyroxine (SYNTHROID) 75 MCG tablet, Take 1 tablet (75 mcg total) by mouth daily before breakfast.,  Disp: 90 tablet, Rfl: 0   loratadine (CLARITIN) 10 MG tablet, Take 1 tablet (10 mg total) by mouth daily. (for itching) (Patient taking differently: Take 10 mg by mouth as needed for allergies or itching. ), Disp: 30 tablet, Rfl: 11   meclizine (ANTIVERT) 25 MG tablet, Take 25 mg by mouth 3 (three) times daily as needed for dizziness or nausea. , Disp: , Rfl:    metoprolol tartrate (LOPRESSOR) 25 MG tablet, TAKE  (1)  TABLET TWICE A DAY. (Patient taking differently: Take 25 mg by mouth daily as needed (Pressure in chest). ), Disp: 180 tablet, Rfl: 0   Multiple Vitamins-Minerals (CENTRUM SILVER ULTRA WOMENS PO), Take 1 tablet by mouth daily., Disp: , Rfl:    nitroGLYCERIN (NITROSTAT) 0.4 MG SL tablet, Place 1 tablet (0.4 mg total) under the tongue every 5 (five) minutes as needed for chest pain., Disp: 25 tablet, Rfl: 2   pantoprazole (PROTONIX) 40 MG tablet, Take 1 tablet (40 mg total) by mouth every evening. (Patient taking differently: Take 40 mg by mouth daily. ), Disp: 90 tablet, Rfl: 3   PARoxetine (PAXIL) 20 MG tablet, TAKE (1) TABLET DAILY IN THE MORNING. (Patient taking differently: Take 20 mg by mouth daily. ), Disp: 90 tablet, Rfl: 1   rosuvastatin (CRESTOR) 40 MG tablet, TAKE (1/2) TABLET DAILY.  (Patient taking differently: Take 20 mg by mouth daily. ), Disp: 45 tablet, Rfl: 0   torsemide (DEMADEX) 20 MG tablet, Take 1 tablet (20 mg total) by mouth daily as needed (edema)., Disp: 30 tablet, Rfl: 0 Social History   Socioeconomic History   Marital status: Widowed    Spouse name: Not on file   Number of children: 4   Years of education: 8   Highest education level: 8th grade  Occupational History   Occupation: Retired  Tobacco Use   Smoking status: Never Smoker   Smokeless tobacco: Never Used  Scientific laboratory technician Use: Never used  Substance and Sexual Activity   Alcohol use: No   Drug use: No   Sexual activity: Never    Birth control/protection: Post-menopausal  Other Topics Concern   Not on file  Social History Narrative   Not on file   Social Determinants of Health   Financial Resource Strain: Low Risk    Difficulty of Paying Living Expenses: Not hard at all  Food Insecurity: No Food Insecurity   Worried About Charity fundraiser in the Last Year: Never true   [place] in the Last Year: Never true  Transportation Needs: No Transportation Needs   Lack of Transportation (Medical): No   Lack of Transportation (Non-Medical): No  Physical Activity: Inactive   Days of Exercise per Week: 0 days   Minutes of Exercise per Session: 0 min  Stress: No Stress Concern Present   Feeling of Stress : Only a little  Social Connections: Moderately Integrated   Frequency of Communication with Friends and Family: More than three times a week   Frequency of Social Gatherings with Friends and Family: Once a week   Attends Religious Services: More than 4 times per year   Active Member of [employer] or Organizations: Yes   Attends Archivist Meetings: More than 4 times per year   Marital Status: Widowed  Human resources officer Violence: Not At Risk   Fear of Current or Ex-Partner: No   Emotionally Abused: No   Physically Abused: No   Sexually  Abused: No   Family  History  Problem Relation Age of Onset   Stroke Mother    Other Mother        brain tumor   Other Father        MI   Heart attack Father    Stroke Sister    Macular degeneration Sister    Diabetes Daughter    Cancer Daughter        ovarian   Colon polyps Daughter    Atrial fibrillation Sister    Hyperlipidemia Sister    Osteoporosis Sister    Stroke Sister    Uterine cancer Sister    Heart attack Brother    Heart disease Brother    Asthma Brother    Breast cancer Neg Hx     Objective: Office vital signs reviewed. BP 138/71    Pulse (!) 50    Temp (!) 97.1 F (36.2 C)    Ht 5\' 3"  (1.6 m)    Wt 138 lb (62.6 kg)    SpO2 98%    BMI 24.45 kg/m   Physical Examination:  General: Awake, alert, chronically ill-appearing elderly female, No acute distress HEENT: Normal, sclera white Cardio: regular rate  Pulm: normal work of breathing on room air GI: Flat, soft, nondistended.  She has mild tenderness palpation along the left upper quadrant, left lower quadrant and epigastric region.  There are no palpable abnormalities.  No guarding or rebound. GU: Mild left CVA tenderness noted  Assessment/ Plan: 86 y.o. female   1. Hospital discharge follow-up I reviewed her discharge summary, hospital notes and imaging results and labs.  2. Abnormal CT of the abdomen Checking CBC, BMP.  I have referred her to nephrology given the left renal artery stenosis that is at critical level and the changes within the right renal artery.  I actually spoke to Dr. Theador Hawthorne today to inquire as to whether or not this would cause her left-sided pain but he did not feel that this was the etiology.  He did agree that she would benefit from nephrologic evaluation over urologic evaluation at this time.  However, if symptoms persist despite normalization of bowel movements may need to consider having her reevaluated by her urologist to rule out nephrolithiasis as the etiology  of her pain. - CBC with Differential - Basic Metabolic Panel - Ambulatory referral to Nephrology  3. Left renal artery stenosis ([place]) - Ambulatory referral to Nephrology  4. Fibromuscular dysplasia of right renal artery ([place]) - Ambulatory referral to Nephrology  5. Lesion of pancreas Possibly cystic versus intraductal papillary mucinous neoplasia.  Stability has been reassuring.  Unsure how we can get this further evaluated in 2 years through MRI but we could do a pancreatic protocol CT.  I discussed this with her daughter-in-law and the patient today  6. Dysuria Urinalysis with 1+ leukocytes but really no other evidence to suggest infection.  This has been sent for urine culture.  We will plan for antibiotics pending culture results .  specimen was brought to the office by the patient was collected less than 2 hours ago - Urinalysis, Complete - Urine Culture  7. Left lateral abdominal pain Possibly related to IBS constipation.  I am putting her back on the Linzess and samples were provided today.  I spoke to gastroenterology and they are able to get her in tomorrow with Walden Field at 1:30 PM.  Her daughter-in-law is aware of appointment date and time and voiced great appreciation for the moved up appointment.  8. Left  flank pain Unlikely to be an obstructing kidney stone given recent unremarkable CT.  However, if symptoms persist despite improvement in constipation we may need to consider reevaluation with urology.   Orders Placed This Encounter  Procedures   Urine Culture   CBC with Differential   Basic Metabolic Panel   Urinalysis, Complete   Ambulatory referral to Nephrology    Referral Priority:   Urgent    Referral Type:   Consultation    Referral Reason:   Specialty Services Required    Requested Specialty:   Nephrology    Number of Visits Requested:   1   Meds ordered this encounter  Medications   linaclotide (LINZESS) 145 MCG CAPS capsule    Sig: Take 1 capsule  (145 mcg total) by mouth daily before breakfast.    Dispense:  20 capsule    Refill:  0     Graceyn Fodor Windell Moulding, DO [place] [phone]

## 2019-11-26 NOTE — Telephone Encounter (Signed)
Received secure chat message from Adam Phenix, DO stating patient would like to be seen before December and if we have any openings to please call daughter-in-law Neoma Laming at [phone].  Looks like the only opening we have in the next few weeks is with Walden Field, NP tomorrow at 1:30 PM. Erline Levine, please call daughter-in-law to see if she would like to bring patient tomorrow.

## 2019-11-26 NOTE — Telephone Encounter (Signed)
Patient coming tomorrow to see Randall Hiss

## 2019-11-26 NOTE — Telephone Encounter (Signed)
Noted  

## 2019-11-27 ENCOUNTER — Ambulatory Visit (INDEPENDENT_AMBULATORY_CARE_PROVIDER_SITE_OTHER): Payer: Medicare Other | Admitting: Nurse Practitioner

## 2019-11-27 ENCOUNTER — Encounter: Payer: Self-pay | Admitting: Nurse Practitioner

## 2019-11-27 VITALS — BP 128/78 | HR 49 | Temp 96.9°F | Ht 62.0 in | Wt 137.8 lb

## 2019-11-27 DIAGNOSIS — R109 Unspecified abdominal pain: Secondary | ICD-10-CM | POA: Diagnosis not present

## 2019-11-27 DIAGNOSIS — K580 Irritable bowel syndrome with diarrhea: Secondary | ICD-10-CM

## 2019-11-27 DIAGNOSIS — K591 Functional diarrhea: Secondary | ICD-10-CM

## 2019-11-27 DIAGNOSIS — K5900 Constipation, unspecified: Secondary | ICD-10-CM | POA: Diagnosis not present

## 2019-11-27 DIAGNOSIS — I701 Atherosclerosis of renal artery: Secondary | ICD-10-CM | POA: Diagnosis not present

## 2019-11-27 DIAGNOSIS — R197 Diarrhea, unspecified: Secondary | ICD-10-CM | POA: Insufficient documentation

## 2019-11-27 LAB — CBC WITH DIFFERENTIAL/PLATELET
Basophils Absolute: 0.1 10*3/uL (ref 0.0–0.2)
Basos: 1 %
EOS (ABSOLUTE): 0.4 10*3/uL (ref 0.0–0.4)
Eos: 6 %
Hematocrit: 34.6 % (ref 34.0–46.6)
Hemoglobin: 11.5 g/dL (ref 11.1–15.9)
Immature Grans (Abs): 0 10*3/uL (ref 0.0–0.1)
Immature Granulocytes: 1 %
Lymphocytes Absolute: 1.4 10*3/uL (ref 0.7–3.1)
Lymphs: 26 %
MCH: 31.2 pg (ref 26.6–33.0)
MCHC: 33.2 g/dL (ref 31.5–35.7)
MCV: 94 fL (ref 79–97)
Monocytes Absolute: 0.5 10*3/uL (ref 0.1–0.9)
Monocytes: 10 %
Neutrophils Absolute: 3.2 10*3/uL (ref 1.4–7.0)
Neutrophils: 56 %
Platelets: 251 10*3/uL (ref 150–450)
RBC: 3.69 x10E6/uL — ABNORMAL LOW (ref 3.77–5.28)
RDW: 12.8 % (ref 11.7–15.4)
WBC: 5.6 10*3/uL (ref 3.4–10.8)

## 2019-11-27 LAB — BASIC METABOLIC PANEL
BUN/Creatinine Ratio: 10 — ABNORMAL LOW (ref 12–28)
BUN: 14 mg/dL (ref 8–27)
CO2: 23 mmol/L (ref 20–29)
Calcium: 9.2 mg/dL (ref 8.7–10.3)
Chloride: 100 mmol/L (ref 96–106)
Creatinine, Ser: 1.42 mg/dL — ABNORMAL HIGH (ref 0.57–1.00)
GFR calc Af Amer: 39 mL/min/{1.73_m2} — ABNORMAL LOW (ref >59)
GFR calc non Af Amer: 33 mL/min/{1.73_m2} — ABNORMAL LOW (ref >59)
Glucose: 124 mg/dL — ABNORMAL HIGH (ref 65–99)
Potassium: 4.3 mmol/L (ref 3.5–5.2)
Sodium: 136 mmol/L (ref 134–144)

## 2019-11-27 MED ORDER — TRAMADOL HCL 50 MG PO TABS: 25.0000 mg | ORAL_TABLET | Freq: Two times a day (BID) | ORAL | 0 refills | Status: DC | PRN

## 2019-11-27 NOTE — Progress Notes (Signed)
Cc'ed to pcp °

## 2019-11-27 NOTE — Patient Instructions (Signed)
Your health issues we discussed today were:   "Functional abdominal pain" (such as irritable bowel syndrome) with constipation and diarrhea: 1. Hold Linzess for now 2. Use Colace stool softener 100 mg once a day 3. If you have not had a bowel movement in 2 or more days you can take a dose of MiraLAX.  You can increase this to twice a day depending on your response 4. Continue your other current medications 5. As we discussed, drink Ensure 1 to 2 cans/bottles a day to ensure you are getting adequate nutrition 6. I have sent a prescription for low-dose Ultram pain medication in case you have a period of severe pain.  As we discussed, this can cause weakness and dizziness to try to stay home and avoid being up and about.  It can also cause constipation so keep an eye out for that as well. 7. Call us for any worsening or severe symptoms  Overall I recommend:  1. Continue your other current medications 2. Return for follow-up in 4 weeks 3. Call us if you have any questions or concerns   ---------------------------------------------------------------  At [facility] we value your feedback. You may receive a survey about your visit today. Please share your experience as we strive to create trusting relationships with our patients to provide genuine, compassionate, quality care.  We appreciate your understanding and patience as we review any laboratory studies, imaging, and other diagnostic tests that are ordered as we care for you. Our office policy is 5 business days for review of these results, and any emergent or urgent results are addressed in a timely manner for your best interest. If you do not hear from our office in 1 week, please contact us.   We also encourage the use of MyChart, which contains your medical information for your review as well. If you are not enrolled in this feature, an access code is on this after visit summary for your convenience. Thank you for allowing  [place] to be involved in your care.  It was great to see you today!  I hope you have a Happy Thanksgiving!!    ---------------------------------------------------------------

## 2019-11-27 NOTE — Progress Notes (Signed)
Referring Provider: [name], DO Primary Care Physician:  [name], DO Primary GI:  Dr. Abbey Chatters  Chief Complaint  Patient presents with  . Abdominal Pain    still on going, not doing much better     HPI:   [name] is a 86 y.o. female who presents for posthospitalization follow-up.  The patient was admitted to the hospital 11/14/2019 through 11/22/2019.  Noted history of A. fib, status post cardioversion and AICD placement, CAD, MI, chronic systolic heart failure with EF 40 to 45%, hypertension, anxiety, stage III chronic kidney disease, type 2 diabetes, diverticulosis, esophageal stricture, esophagitis, GERD, hiatal hernia, rectal bleeding, hyperlipidemia.  She presented to emergency room from EMS with weakness and dizziness for the previous 2 weeks as well as abdominal pain with decreased appetite and early satiety, bloating and change in bowel habits.  Also some complaints of chest pain.  She was seen by GI in the office.  Abdominal chest pain apparently GI in origin generally triggered by meals.  Troponin is normal and EKG without changes.  Also query gastroparesis.  EGD was completed and essentially normal.  Right upper quadrant ultrasound ordered to rule out biliary etiology, probable fatty liver, no other acute findings.  She was having abdominal pain and given Bentyl on 11/21/2019.  Clinically stable, wants to see local GI (previously saw Dr. Henrene Pastor in 2018).  Overall abdominal pain felt likely functional in etiology.  Plan amitriptyline and Bentyl with outpatient follow-up.  History of mild anemia, no evidence of overt GI bleed.  During discharge summary from hospitalist noted previous diarrhea and having loose stools, subsequently started on Bentyl for possible IBS component.  CTA of the abdomen was completed that showed main mesenteric arteries patent.  Query musculoskeletal/functional component as well.  Of note lipase was normal during  hospitalization.  The patient did see primary care yesterday for discharge follow-up.  It was noted the patient is staying with her daughter-in-law.  Home health physical therapy has been ordered but not able to start until next week.  Continues to feel weak and daughter-in-law notes a lot of sleeping since discharge.  Bladder pressure like she needs to urinate but minimal urinary output despite adequate hydration.  Left-sided abdominal pain that radiates in the upper back, no fevers.  Eating small meals but not getting many calories.  No bowel movements but feels like she has to have a bowel movement.  She has previously had a good response to Linzess in the hospital was not discharged home on this.  Requesting to see GI sooner.  Recommended labs including CBC and BMP, referral to nephrology.  Query abdominal discomfort and constipation is IBS constipation type and she was restarted on Linzess with samples.  Labs ordered by primary care include CBC which is essentially normal with normal white blood cell count and hemoglobin.  BMP with progressively elevated creatinine now 1.42, electrolytes normal.  Urinalysis essentially normal, urine culture pending.  We reviewed all CT images of the abdomen completed as inpatient as well as right upper quadrant ultrasound.  No acute abnormalities identified.  No specific liver abnormalities or biliary abnormalities.  Reviewed previous GI office visit in 2018 by Dr. Henrene Pastor.  Patient apparently has a chronic history of vague abdominal discomfort.  At a visit also with Dr. Henrene Pastor on 06/30/2014 the patient was complaining of vague lower abdominal discomfort with no triggering or relieving factors ongoing for months.  Etiology deemed unclear but reassuring a colonoscopy and  EGD previously completed in 2012.  No definite plan identified for abdominal discomfort at that time.  Today she is accompanied by her daughter-in-law.  Today she states she is not doing much better since  hospital discharge.  Still has abdominal pain.  She took the [place] yesterday and today and had bowel movements yesterday and today.  She has been started on amitriptyline 10 mg at bedtime. She had to get up during the night and had some loose stools, likely from [place]. She was put on 145 mcg samples. Abdominal pain is mid- to lower abdomen and LLQ. Described as cramping. Felt pretty good yesterday and then took [place] and had 5 loose stools. When she awoke at 3 am with frequent loose stools she felt worse. She has also had some weakness and fatigue. She also had rectal burning after multiple loose stools. Daughter-in-law states she's sleeping about 18 hours a day. Drinks adequate fluids. Decreased appetite, trying to eat better. Denies N/V, hematochezia, melena, fever, chills. Denies URI or flu-like symptoms. Denies loss of sense of taste or smell. The patient has received COVID-19 vaccination(s). Denies chest pain, dyspnea, dizziness, lightheadedness, syncope, near syncope. Denies any other upper or lower GI symptoms.  Past Medical History:  Diagnosis Date  . AICD (automatic cardioverter/defibrillator) present   . Allergy    SESONAL  . Anxiety   . ARTHRITIS   . Arthritis   . Atrial fibrillation ([place])   . CAD (coronary artery disease)    a. history of cardiac arrest 1993;  b. s/p LAD/LCX stenting in 2003;  c. 07/2011 Cath: LM nl, LAD patent stent, D1 80ost, LCX patent stent, RCA min irregs;  d. 04/2012 MV: EF 66%, no ishcemia;  e. 12/2013 Echo: Ef 55%, no rwma, Gr 1 DD, triv AI/MR, mildly dil LA;  f. 12/2013 Lexi MV: intermediate risk - apical ischemia and inf/infsept fixed defect, ? artifactual.  . CAD in native artery 12/31/2013   Previous stents to LAD and Ramus patent on cath today 12/31/13 also There is severe disease in the ostial first diagonal which is unchanged from most recent cardiac catheterization. The right coronary artery could not be engaged selectively but nonselective angiography  showed no significant disease in the proximal and midsegment.     . Cataract    DENIES  . Chronic back pain   . Chronic sinus bradycardia   . CKD (chronic kidney disease), stage III ([place])   . Clotting disorder (H[facility])    DVT  . COLITIS 12/02/2007   Qualifier: Diagnosis of  By: Nils Pyle CMA ([place]), [name]    . Diabetes mellitus without complication ([place])    DENIES  . Diverticulosis of colon (without mention of hemorrhage) 2007   Colonoscopy   . Esophageal stricture    a. 2012 s/p dil.  . Esophagitis, unspecified    a. 2012 EGD  . EXTERNAL HEMORRHOIDS   . GERD (gastroesophageal reflux disease)    omeprazole  . H/O hiatal hernia   . Hiatal hernia    a. 2012 EGD.  Marland Kitchen History of DVT in the past, not on Coumadin now    left  leg  . HYPERCHOLESTEROLEMIA   . Hypertension   . ICD (implantable cardiac defibrillator) in place    a. s/p initial ICD in 1993 in setting of cardiac arrest;  b. 01/2007 gen change: Guidant T135 Vitality DS VR single lead ICD.  . Macular degeneration    gets injecton in eye every 5 weeks- last injection - 05/03/2013   .  Myocardial infarction ([place]) 1993  . Near syncope 05/22/2015  . Nephrolithiasis, just saw Dr [name]   . Orthostatic hypotension   . Peripheral vascular disease ([place])    ???  . RECTAL BLEEDING 12/03/2007   Qualifier: Diagnosis of  By: Sharlett Iles MD Byrd Hesselbach   . Unspecified gastritis and gastroduodenitis without mention of hemorrhage    a. 2003 EGD->not noted on 2012 EGD.  Marland Kitchen Unstable angina, neg MI, cath stable.maybe GI 12/30/2013    Past Surgical History:  Procedure Laterality Date  . BREAST BIOPSY Left 11/2013  . CARDIAC CATHETERIZATION    . CARDIAC CATHETERIZATION  01/01/15   patent stents -there is severe disease in ostial 1st diag but without change.   Marland Kitchen CARDIAC DEFIBRILLATOR PLACEMENT  02/05/2007   Guidant  . CARDIOVASCULAR STRESS TEST  12/30/2013   abnormal  . CARDIOVERSION N/A 02/13/2019   Procedure: CARDIOVERSION;  Surgeon:  Sanda Klein, MD;  Location: [facility];  Service: Cardiovascular;  Laterality: N/A;  . CATARACT EXTRACTION Right    Dr. Katy Fitch  . COLONOSCOPY    . coronary stents     . CYSTOSCOPY WITH URETEROSCOPY Right 05/20/2013   Procedure: CYSTOSCOPY, RIGHT URETEROSCOPY STONE EXTRACTION, Insertion of right DOUBLE J STENT ;  Surgeon: Irine Seal, MD;  Location: [facility];  Service: Urology;  Laterality: Right;  . CYSTOSCOPY WITH URETEROSCOPY AND STENT PLACEMENT N/A 06/03/2013   Procedure: SECOND LOOK CYSTOSCOPY WITH URETEROSCOPY  HOLMIUM LASER LITHO AND STONE EXTRACTION Sammie Bench ;  Surgeon: Malka So, MD;  Location: [facility];  Service: Urology;  Laterality: N/A;  . ESOPHAGOGASTRODUODENOSCOPY (EGD) WITH PROPOFOL N/A 11/20/2019   Procedure: ESOPHAGOGASTRODUODENOSCOPY (EGD) WITH PROPOFOL;  Surgeon: Daneil Dolin, MD;  [facility];  Service: Endoscopy;  Laterality: N/A;  . GIVENS CAPSULE STUDY N/A 10/30/2016   Procedure: GIVENS CAPSULE STUDY;  Surgeon: Irene Shipper, MD;  Location: [place];  Service: Endoscopy;  Laterality: N/A;  NEEDS TO BE ADMITTED FOR OBSERVATION PT. HAS DEFIB  . HEMORROIDECTOMY  80's  . HOLMIUM LASER APPLICATION Right 0/09/3265   Procedure: HOLMIUM LASER APPLICATION;  Surgeon: Irine Seal, MD;  Location: [facility];  Service: Urology;  Laterality: Right;  . HYSTEROSCOPY WITH D & C  09/02/2010   Procedure: DILATATION AND CURETTAGE (D&C) /HYSTEROSCOPY;  Surgeon: Margarette Asal;  Location: [facility];  Service: Gynecology;  Laterality: N/A;  Dilation and Curettage with Hysteroscopy and Polypectomy  . IMPLANTABLE CARDIOVERTER DEFIBRILLATOR (ICD) GENERATOR CHANGE N/A 02/10/2014   Procedure: ICD GENERATOR CHANGE;  Surgeon: Sanda Klein, MD;  Location: [facility];  Service: Cardiovascular;  Laterality: N/A;  . LEFT HEART CATHETERIZATION WITH CORONARY ANGIOGRAM N/A 07/28/2011   Procedure: LEFT HEART CATHETERIZATION WITH CORONARY ANGIOGRAM;  Surgeon: Lorretta Harp, MD;  [facility];  Service: Cardiovascular;  Laterality: N/A;  . LEFT HEART CATHETERIZATION WITH CORONARY ANGIOGRAM N/A 12/31/2013   Procedure: LEFT HEART CATHETERIZATION WITH CORONARY ANGIOGRAM;  Surgeon: Wellington Hampshire, MD;  Location: [facility] CATH LAB;  Service: Cardiovascular;  Laterality: N/A;    Current Outpatient Medications  Medication Sig Dispense Refill  . amiodarone (PACERONE) 200 MG tablet TAKE 1 TABLET DAILY 30 tablet 3  . amitriptyline (ELAVIL) 10 MG tablet Take 1 tablet (10 mg total) by mouth at bedtime. 30 tablet 1  . apixaban (ELIQUIS) 5 MG TABS tablet Take 1 tablet (5 mg total) by mouth 2 (two) times daily. 180 tablet 1  . carboxymethylcellulose (REFRESH PLUS) 0.5 % SOLN Place 1  drop into both eyes 3 (three) times daily as needed (dry/irritated eyes.).    Marland Kitchen Cholecalciferol (VITAMIN D) 50 MCG (2000 UT) CAPS Take 1 capsule by mouth daily.    . diphenoxylate-atropine (LOMOTIL) 2.5-0.025 MG tablet TAKE 1 TABLET EVERY 8 HOURS AS NEEDED FOR DIARRHEA (Patient taking differently: Take 1 tablet by mouth every 8 (eight) hours as needed for diarrhea or loose stools. ) 30 tablet 0  . hydrALAZINE (APRESOLINE) 10 MG tablet TAKE 1 TABLET EVERY 8 HOURS (Patient taking differently: Take 10 mg by mouth every 8 (eight) hours. ) 90 tablet 4  . hydrocortisone (ANUSOL-HC) 25 MG suppository Place 1 suppository (25 mg total) rectally 2 (two) times daily. 10 suppository 0  . isosorbide mononitrate (IMDUR) 30 MG 24 hr tablet TAKE ONE HALF (1/2) TABLET DAILY (Patient taking differently: Take 15 mg by mouth daily. ) 90 tablet 2  . levothyroxine (SYNTHROID) 75 MCG tablet Take 1 tablet (75 mcg total) by mouth daily before breakfast. 90 tablet 0  . linaclotide (LINZESS) 145 MCG CAPS capsule Take 1 capsule (145 mcg total) by mouth daily before breakfast. 20 capsule 0  . loratadine (CLARITIN) 10 MG tablet Take 1 tablet (10 mg total) by mouth daily. (for itching) (Patient taking differently: Take 10 mg by mouth as needed for  allergies or itching. ) 30 tablet 11  . meclizine (ANTIVERT) 25 MG tablet Take 25 mg by mouth 3 (three) times daily as needed for dizziness or nausea.     . metoprolol tartrate (LOPRESSOR) 25 MG tablet TAKE  (1)  TABLET TWICE A DAY. (Patient taking differently: Take 25 mg by mouth daily as needed (Pressure in chest). ) 180 tablet 0  . Multiple Vitamins-Minerals (CENTRUM SILVER ULTRA WOMENS PO) Take 1 tablet by mouth daily.    . nitroGLYCERIN (NITROSTAT) 0.4 MG SL tablet Place 1 tablet (0.4 mg total) under the tongue every 5 (five) minutes as needed for chest pain. 25 tablet 2  . pantoprazole (PROTONIX) 40 MG tablet Take 1 tablet (40 mg total) by mouth every evening. (Patient taking differently: Take 40 mg by mouth daily. ) 90 tablet 3  . PARoxetine (PAXIL) 20 MG tablet TAKE (1) TABLET DAILY IN THE MORNING. (Patient taking differently: Take 20 mg by mouth daily. ) 90 tablet 1  . rosuvastatin (CRESTOR) 40 MG tablet TAKE (1/2) TABLET DAILY. (Patient taking differently: Take 20 mg by mouth daily. ) 45 tablet 0  . torsemide (DEMADEX) 20 MG tablet Take 1 tablet (20 mg total) by mouth daily as needed (edema). 30 tablet 0   No current facility-administered medications for this visit.    Allergies as of 11/27/2019 - Review Complete 11/27/2019  Allergen Reaction Noted  . Sotalol Other (See Comments) 11/01/2016  . Bactrim [sulfamethoxazole-trimethoprim]  08/19/2016  . Ciprofloxacin Nausea Only 02/05/2018  . Actos [pioglitazone] Swelling 07/31/2012  . Adhesive [tape] Other (See Comments) 02/20/2014  . Contrast media [iodinated diagnostic agents] Other (See Comments) 07/25/2011  . Latex Rash 05/29/2013  . Lipitor [atorvastatin] Other (See Comments) 07/31/2012    Family History  Problem Relation Age of Onset  . Stroke Mother   . Other Mother        brain tumor  . Other Father        MI  . Heart attack Father   . Stroke Sister   . Macular degeneration Sister   . Diabetes Daughter   . Cancer  Daughter        ovarian  . Colon polyps  Daughter   . Atrial fibrillation Sister   . Hyperlipidemia Sister   . Osteoporosis Sister   . Stroke Sister   . Uterine cancer Sister   . Heart attack Brother   . Heart disease Brother   . Asthma Brother   . Breast cancer Neg Hx     Social History   Socioeconomic History  . Marital status: Widowed    Spouse name: Not on file  . Number of children: 4  . Years of education: 8  . Highest education level: 8th grade  Occupational History  . Occupation: Retired  Tobacco Use  . Smoking status: Never Smoker  . Smokeless tobacco: Never Used  Vaping Use  . Vaping Use: Never used  Substance and Sexual Activity  . Alcohol use: No  . Drug use: No  . Sexual activity: Never    Birth control/protection: Post-menopausal  Other Topics Concern  . Not on file  Social History Narrative  . Not on file   Social Determinants of Health   Financial Resource Strain: Low Risk   . Difficulty of Paying Living Expenses: Not hard at all  Food Insecurity: No Food Insecurity  . Worried About Charity fundraiser in the Last Year: Never true  . Ran Out of Food in the Last Year: Never true  Transportation Needs: No Transportation Needs  . Lack of Transportation (Medical): No  . Lack of Transportation (Non-Medical): No  Physical Activity: Inactive  . Days of Exercise per Week: 0 days  . Minutes of Exercise per Session: 0 min  Stress: No Stress Concern Present  . Feeling of Stress : Only a little  Social Connections: Moderately Integrated  . Frequency of Communication with Friends and Family: More than three times a week  . Frequency of Social Gatherings with Friends and Family: Once a week  . Attends Religious Services: More than 4 times per year  . Active Member of Clubs or Organizations: Yes  . Attends Archivist Meetings: More than 4 times per year  . Marital Status: Widowed    Subjective: Review of Systems  Constitutional: Positive for  malaise/fatigue. Negative for chills, fever and weight loss.  HENT: Negative for congestion and sore throat.   Respiratory: Negative for cough and shortness of breath.   Cardiovascular: Negative for chest pain and palpitations.  Gastrointestinal: Positive for abdominal pain, constipation and diarrhea. Negative for blood in stool, heartburn, melena, nausea and vomiting.  Musculoskeletal: Negative for joint pain and myalgias.  Skin: Negative for rash.  Neurological: Negative for dizziness and weakness.  Endo/Heme/Allergies: Does not bruise/bleed easily.  Psychiatric/Behavioral: Negative for depression. The patient is not nervous/anxious.   All other systems reviewed and are negative.    Objective: BP 128/78   Pulse (!) 49   Temp (!) 96.9 F (36.1 C)   Ht 5\' 2"  (1.575 m)   Wt 137 lb 12.8 oz (62.5 kg)   BMI 25.20 kg/m  Physical Exam Vitals and nursing note reviewed.  Constitutional:      General: She is not in acute distress.    Appearance: Normal appearance. She is well-developed and normal weight. She is not ill-appearing, toxic-appearing or diaphoretic.  HENT:     Head: Normocephalic and atraumatic.     Nose: No congestion or rhinorrhea.  Eyes:     General: No scleral icterus. Cardiovascular:     Rate and Rhythm: Normal rate and regular rhythm.     Heart sounds: Normal heart sounds.  Pulmonary:  Effort: Pulmonary effort is normal. No respiratory distress.     Breath sounds: Normal breath sounds.  Abdominal:     General: Bowel sounds are normal.     Palpations: Abdomen is soft. There is no hepatomegaly, splenomegaly or mass.     Tenderness: There is no abdominal tenderness. There is no guarding or rebound.     Hernia: No hernia is present.    Skin:    General: Skin is warm and dry.     Coloration: Skin is not jaundiced.     Findings: No rash.  Neurological:     General: No focal deficit present.     Mental Status: She is alert and oriented to person, place, and  time.  Psychiatric:        Attention and Perception: Attention normal.        Mood and Affect: Mood normal.        Speech: Speech normal.        Behavior: Behavior normal.        Thought Content: Thought content normal.        Cognition and Memory: Cognition and memory normal.      Assessment:  Very pleasant 86 year old female who presents with her daughter for follow-up post hospitalization.  Hospitalization as summarized in HPI.  In short, she presented with weakness and dizziness as well as abdominal pain and decreased appetite as well as change in bowel habits.  EGD essentially normal, right upper quadrant ultrasound essentially normal.  Felt possibly functional in nature and started on Bentyl.  Plan for possible amitriptyline as well if needed.  Also of note, CT of the abdomen which showed main mesenteric arteries patent.  Possible musculoskeletal component contributing.  Abdominal pain: Today she states she is not doing much better and has persistent abdominal pain.  Started amitriptyline 10 mg at bedtime.  Pain mid to lower abdomen as well as left lower quadrant and crampy.  Possible irritable bowel component and made recommendations for alterations in bowel medications, as per below.  At this point I will prescribe her low-dose Ultram to help with her abdominal pain.  Appropriate precautions were given.  Stool changes with constipation and diarrhea: She has been having more loose stools, particularly during the night since being started on Linzess 145 mcg.  This may be overkill for her.  At this point I will have her hold Linzess and start Colace once daily with MiraLAX as needed for no bowel movement in 2+ days.  No red flag/warning signs or symptoms.  Appears last colonoscopy in 2012.  She may need to repeat colonoscopy if she does not improve.  Decreased appetite: Recommended she use Ensure or boost, or the like 1-2 times a day to ensure adequate nutrition.  Likely weight loss from  recent acute illness.  EGD normal, colonoscopy 2012 normal, capsule endoscopy in 2018 essentially normal as well.   Plan: 1. Hold Linzess 2. Start Colace once a day 3. Use MiraLAX if no bowel movement in 2 days 4. Ultram for abdominal pain 5. Continue other medications 6. Ensure 1-2 containers a day.  Can substitute boost or the like as well 7. Return for follow-up in 4 weeks 8. Call for worsening or severe symptoms    Thank you for allowing [place] to participate in the care of Renee Corona, DNP, AGNP-C Adult & Gerontological Nurse Practitioner [facility]   11/27/2019 2:20 PM   Disclaimer: This note was dictated with voice  recognition software. Similar sounding words can inadvertently be transcribed and may not be corrected upon review.

## 2019-11-28 LAB — URINE CULTURE

## 2019-12-02 ENCOUNTER — Encounter: Payer: Self-pay | Admitting: Family Medicine

## 2019-12-02 NOTE — Addendum Note (Signed)
Addended by: [name] on: 12/02/2019 04:39 PM   Modules accepted: Orders

## 2019-12-03 ENCOUNTER — Ambulatory Visit (INDEPENDENT_AMBULATORY_CARE_PROVIDER_SITE_OTHER): Payer: Medicare Other

## 2019-12-03 ENCOUNTER — Other Ambulatory Visit: Payer: Self-pay

## 2019-12-03 DIAGNOSIS — G4700 Insomnia, unspecified: Secondary | ICD-10-CM

## 2019-12-03 DIAGNOSIS — Z9181 History of falling: Secondary | ICD-10-CM

## 2019-12-03 DIAGNOSIS — E785 Hyperlipidemia, unspecified: Secondary | ICD-10-CM | POA: Diagnosis not present

## 2019-12-03 DIAGNOSIS — K581 Irritable bowel syndrome with constipation: Secondary | ICD-10-CM | POA: Diagnosis not present

## 2019-12-03 DIAGNOSIS — Z95810 Presence of automatic (implantable) cardiac defibrillator: Secondary | ICD-10-CM

## 2019-12-03 DIAGNOSIS — I4891 Unspecified atrial fibrillation: Secondary | ICD-10-CM

## 2019-12-03 DIAGNOSIS — K219 Gastro-esophageal reflux disease without esophagitis: Secondary | ICD-10-CM

## 2019-12-03 DIAGNOSIS — Z7901 Long term (current) use of anticoagulants: Secondary | ICD-10-CM

## 2019-12-03 DIAGNOSIS — I10 Essential (primary) hypertension: Secondary | ICD-10-CM

## 2019-12-03 DIAGNOSIS — H9193 Unspecified hearing loss, bilateral: Secondary | ICD-10-CM

## 2019-12-03 DIAGNOSIS — F32A Depression, unspecified: Secondary | ICD-10-CM

## 2019-12-04 ENCOUNTER — Encounter (INDEPENDENT_AMBULATORY_CARE_PROVIDER_SITE_OTHER): Payer: Medicare Other | Admitting: Ophthalmology

## 2019-12-09 ENCOUNTER — Other Ambulatory Visit: Payer: Self-pay | Admitting: Nephrology

## 2019-12-09 ENCOUNTER — Other Ambulatory Visit (HOSPITAL_COMMUNITY): Payer: Self-pay | Admitting: Nephrology

## 2019-12-09 ENCOUNTER — Other Ambulatory Visit: Payer: Self-pay | Admitting: Cardiovascular Disease

## 2019-12-09 DIAGNOSIS — I5032 Chronic diastolic (congestive) heart failure: Secondary | ICD-10-CM

## 2019-12-09 DIAGNOSIS — D638 Anemia in other chronic diseases classified elsewhere: Secondary | ICD-10-CM

## 2019-12-09 DIAGNOSIS — N200 Calculus of kidney: Secondary | ICD-10-CM

## 2019-12-09 DIAGNOSIS — Z79899 Other long term (current) drug therapy: Secondary | ICD-10-CM

## 2019-12-09 DIAGNOSIS — N1832 Chronic kidney disease, stage 3b: Secondary | ICD-10-CM

## 2019-12-09 DIAGNOSIS — I129 Hypertensive chronic kidney disease with stage 1 through stage 4 chronic kidney disease, or unspecified chronic kidney disease: Secondary | ICD-10-CM

## 2019-12-09 DIAGNOSIS — N280 Ischemia and infarction of kidney: Secondary | ICD-10-CM

## 2019-12-09 DIAGNOSIS — I10 Essential (primary) hypertension: Secondary | ICD-10-CM

## 2019-12-10 NOTE — Progress Notes (Signed)
Cc'ed to pcp °

## 2019-12-15 ENCOUNTER — Other Ambulatory Visit: Payer: Self-pay | Admitting: Family Medicine

## 2019-12-16 ENCOUNTER — Other Ambulatory Visit: Payer: Self-pay | Admitting: Cardiovascular Disease

## 2019-12-16 ENCOUNTER — Other Ambulatory Visit: Payer: Self-pay | Admitting: Family Medicine

## 2019-12-18 ENCOUNTER — Ambulatory Visit (HOSPITAL_COMMUNITY)
Admission: RE | Admit: 2019-12-18 | Discharge: 2019-12-18 | Disposition: A | Discharge status: Home / Self Care | Payer: Medicare Other | Source: Ambulatory Visit | Attending: Nephrology | Admitting: Nephrology

## 2019-12-18 ENCOUNTER — Other Ambulatory Visit: Payer: Self-pay

## 2019-12-18 DIAGNOSIS — N200 Calculus of kidney: Secondary | ICD-10-CM

## 2019-12-18 DIAGNOSIS — N1832 Chronic kidney disease, stage 3b: Secondary | ICD-10-CM

## 2019-12-18 DIAGNOSIS — I10 Essential (primary) hypertension: Secondary | ICD-10-CM | POA: Diagnosis present

## 2019-12-22 ENCOUNTER — Emergency Department (HOSPITAL_COMMUNITY): Payer: Medicare Other

## 2019-12-22 ENCOUNTER — Ambulatory Visit (INDEPENDENT_AMBULATORY_CARE_PROVIDER_SITE_OTHER): Payer: Medicare Other | Admitting: Nurse Practitioner

## 2019-12-22 ENCOUNTER — Encounter (HOSPITAL_COMMUNITY): Payer: Self-pay | Admitting: Emergency Medicine

## 2019-12-22 ENCOUNTER — Encounter: Payer: Self-pay | Admitting: Nurse Practitioner

## 2019-12-22 ENCOUNTER — Inpatient Hospital Stay (HOSPITAL_COMMUNITY)
Admission: EM | Admit: 2019-12-22 | Discharge: 2019-12-26 | DRG: 313 | Disposition: A | Discharge status: Home Health | Payer: Medicare Other | Attending: Internal Medicine | Admitting: Internal Medicine

## 2019-12-22 ENCOUNTER — Other Ambulatory Visit: Payer: Self-pay

## 2019-12-22 VITALS — BP 138/79 | HR 62 | Temp 96.8°F | Ht 62.0 in | Wt 136.2 lb

## 2019-12-22 DIAGNOSIS — F419 Anxiety disorder, unspecified: Secondary | ICD-10-CM | POA: Diagnosis present

## 2019-12-22 DIAGNOSIS — Z8371 Family history of colonic polyps: Secondary | ICD-10-CM

## 2019-12-22 DIAGNOSIS — M1711 Unilateral primary osteoarthritis, right knee: Secondary | ICD-10-CM

## 2019-12-22 DIAGNOSIS — I480 Paroxysmal atrial fibrillation: Secondary | ICD-10-CM | POA: Diagnosis present

## 2019-12-22 DIAGNOSIS — Z7901 Long term (current) use of anticoagulants: Secondary | ICD-10-CM

## 2019-12-22 DIAGNOSIS — R079 Chest pain, unspecified: Secondary | ICD-10-CM

## 2019-12-22 DIAGNOSIS — Z79890 Hormone replacement therapy (postmenopausal): Secondary | ICD-10-CM

## 2019-12-22 DIAGNOSIS — Z83438 Family history of other disorder of lipoprotein metabolism and other lipidemia: Secondary | ICD-10-CM

## 2019-12-22 DIAGNOSIS — R0789 Other chest pain: Secondary | ICD-10-CM | POA: Diagnosis not present

## 2019-12-22 DIAGNOSIS — N179 Acute kidney failure, unspecified: Secondary | ICD-10-CM | POA: Diagnosis not present

## 2019-12-22 DIAGNOSIS — Z91040 Latex allergy status: Secondary | ICD-10-CM

## 2019-12-22 DIAGNOSIS — Z882 Allergy status to sulfonamides status: Secondary | ICD-10-CM

## 2019-12-22 DIAGNOSIS — Z9841 Cataract extraction status, right eye: Secondary | ICD-10-CM

## 2019-12-22 DIAGNOSIS — E1122 Type 2 diabetes mellitus with diabetic chronic kidney disease: Secondary | ICD-10-CM | POA: Diagnosis present

## 2019-12-22 DIAGNOSIS — Z955 Presence of coronary angioplasty implant and graft: Secondary | ICD-10-CM

## 2019-12-22 DIAGNOSIS — I130 Hypertensive heart and chronic kidney disease with heart failure and stage 1 through stage 4 chronic kidney disease, or unspecified chronic kidney disease: Secondary | ICD-10-CM | POA: Diagnosis present

## 2019-12-22 DIAGNOSIS — I252 Old myocardial infarction: Secondary | ICD-10-CM

## 2019-12-22 DIAGNOSIS — N390 Urinary tract infection, site not specified: Secondary | ICD-10-CM | POA: Diagnosis present

## 2019-12-22 DIAGNOSIS — Z8262 Family history of osteoporosis: Secondary | ICD-10-CM

## 2019-12-22 DIAGNOSIS — Z79899 Other long term (current) drug therapy: Secondary | ICD-10-CM

## 2019-12-22 DIAGNOSIS — K219 Gastro-esophageal reflux disease without esophagitis: Secondary | ICD-10-CM | POA: Diagnosis present

## 2019-12-22 DIAGNOSIS — Z91041 Radiographic dye allergy status: Secondary | ICD-10-CM

## 2019-12-22 DIAGNOSIS — Z87442 Personal history of urinary calculi: Secondary | ICD-10-CM

## 2019-12-22 DIAGNOSIS — Z86718 Personal history of other venous thrombosis and embolism: Secondary | ICD-10-CM

## 2019-12-22 DIAGNOSIS — R0600 Dyspnea, unspecified: Secondary | ICD-10-CM

## 2019-12-22 DIAGNOSIS — Z888 Allergy status to other drugs, medicaments and biological substances status: Secondary | ICD-10-CM

## 2019-12-22 DIAGNOSIS — Z825 Family history of asthma and other chronic lower respiratory diseases: Secondary | ICD-10-CM

## 2019-12-22 DIAGNOSIS — Z823 Family history of stroke: Secondary | ICD-10-CM

## 2019-12-22 DIAGNOSIS — Z8616 Personal history of COVID-19: Secondary | ICD-10-CM

## 2019-12-22 DIAGNOSIS — K2100 Gastro-esophageal reflux disease with esophagitis, without bleeding: Secondary | ICD-10-CM | POA: Diagnosis present

## 2019-12-22 DIAGNOSIS — Z20822 Contact with and (suspected) exposure to covid-19: Secondary | ICD-10-CM | POA: Diagnosis not present

## 2019-12-22 DIAGNOSIS — I5022 Chronic systolic (congestive) heart failure: Secondary | ICD-10-CM | POA: Diagnosis present

## 2019-12-22 DIAGNOSIS — Z95810 Presence of automatic (implantable) cardiac defibrillator: Secondary | ICD-10-CM | POA: Diagnosis present

## 2019-12-22 DIAGNOSIS — E871 Hypo-osmolality and hyponatremia: Secondary | ICD-10-CM | POA: Diagnosis not present

## 2019-12-22 DIAGNOSIS — I951 Orthostatic hypotension: Secondary | ICD-10-CM | POA: Diagnosis present

## 2019-12-22 DIAGNOSIS — E440 Moderate protein-calorie malnutrition: Secondary | ICD-10-CM | POA: Diagnosis not present

## 2019-12-22 DIAGNOSIS — Z87440 Personal history of urinary (tract) infections: Secondary | ICD-10-CM

## 2019-12-22 DIAGNOSIS — E7800 Pure hypercholesterolemia, unspecified: Secondary | ICD-10-CM | POA: Diagnosis present

## 2019-12-22 DIAGNOSIS — E039 Hypothyroidism, unspecified: Secondary | ICD-10-CM | POA: Diagnosis present

## 2019-12-22 DIAGNOSIS — Z8249 Family history of ischemic heart disease and other diseases of the circulatory system: Secondary | ICD-10-CM

## 2019-12-22 DIAGNOSIS — Z833 Family history of diabetes mellitus: Secondary | ICD-10-CM

## 2019-12-22 DIAGNOSIS — N200 Calculus of kidney: Secondary | ICD-10-CM | POA: Diagnosis present

## 2019-12-22 DIAGNOSIS — R9431 Abnormal electrocardiogram [ECG] [EKG]: Secondary | ICD-10-CM | POA: Diagnosis present

## 2019-12-22 DIAGNOSIS — I2510 Atherosclerotic heart disease of native coronary artery without angina pectoris: Secondary | ICD-10-CM | POA: Diagnosis present

## 2019-12-22 DIAGNOSIS — N1832 Chronic kidney disease, stage 3b: Secondary | ICD-10-CM | POA: Diagnosis present

## 2019-12-22 DIAGNOSIS — Z6823 Body mass index (BMI) 23.0-23.9, adult: Secondary | ICD-10-CM

## 2019-12-22 DIAGNOSIS — K580 Irritable bowel syndrome with diarrhea: Secondary | ICD-10-CM | POA: Diagnosis present

## 2019-12-22 DIAGNOSIS — F32A Depression, unspecified: Secondary | ICD-10-CM | POA: Diagnosis present

## 2019-12-22 DIAGNOSIS — E785 Hyperlipidemia, unspecified: Secondary | ICD-10-CM | POA: Diagnosis present

## 2019-12-22 LAB — CBC
HCT: 34.5 % — ABNORMAL LOW (ref 36.0–46.0)
Hemoglobin: 11.7 g/dL — ABNORMAL LOW (ref 12.0–15.0)
MCH: 32.2 pg (ref 26.0–34.0)
MCHC: 33.9 g/dL (ref 30.0–36.0)
MCV: 95 fL (ref 80.0–100.0)
Platelets: 286 10*3/uL (ref 150–400)
RBC: 3.63 MIL/uL — ABNORMAL LOW (ref 3.87–5.11)
RDW: 14 % (ref 11.5–15.5)
WBC: 9.1 10*3/uL (ref 4.0–10.5)
nRBC: 0.2 % (ref 0.0–0.2)

## 2019-12-22 LAB — TROPONIN I (HIGH SENSITIVITY)
Troponin I (High Sensitivity): 8 ng/L (ref <18)
Troponin I (High Sensitivity): 8 ng/L (ref <18)

## 2019-12-22 LAB — BASIC METABOLIC PANEL
Anion gap: 9 (ref 5–15)
BUN: 20 mg/dL (ref 8–23)
CO2: 23 mmol/L (ref 22–32)
Calcium: 9.3 mg/dL (ref 8.9–10.3)
Chloride: 99 mmol/L (ref 98–111)
Creatinine, Ser: 1.63 mg/dL — ABNORMAL HIGH (ref 0.44–1.00)
GFR, Estimated: 31 mL/min — ABNORMAL LOW (ref >60)
Glucose, Bld: 142 mg/dL — ABNORMAL HIGH (ref 70–99)
Potassium: 4.2 mmol/L (ref 3.5–5.1)
Sodium: 131 mmol/L — ABNORMAL LOW (ref 135–145)

## 2019-12-22 NOTE — Assessment & Plan Note (Signed)
Not well controlled.  Did not give knee injections due to patient's abnormal EKG and chest pain.  Will reassess tomorrow patient on the way to the emergency department.

## 2019-12-22 NOTE — Progress Notes (Signed)
Established Patient Office Visit  Subjective:  Patient ID: [name], female    DOB: May 13, 1931  Age: 86 y.o. MRN: [id number]  CC:  Chief Complaint  Patient presents with  . Knee Pain    right    HPI REKA WIST presents for Knee injection (osteoartriteis of right knee) this is a chronic problem for patient. Patient reports therapeutic effect from steroid injection every 3 months.  No complication or side effects from therapy.  Patient is compliant and follows up as directed  Concerning patient's chest pain, this is not new for patient.  Patient reports in the last 3 days ongoing chest pain with no resolution with the use of nitroglycerin.  Patient reports using 2 nitroglycerin the night prior to today's visit.  Reports symptoms of bowel pressure with no bowel movements.    Past Medical History:  Diagnosis Date  . AICD (automatic cardioverter/defibrillator) present   . Allergy    SESONAL  . Anxiety   . ARTHRITIS   . Arthritis   . Atrial fibrillation ([place])   . CAD (coronary artery disease)    a. history of cardiac arrest 1993;  b. s/p LAD/LCX stenting in 2003;  c. 07/2011 Cath: LM nl, LAD patent stent, D1 80ost, LCX patent stent, RCA min irregs;  d. 04/2012 MV: EF 66%, no ishcemia;  e. 12/2013 Echo: Ef 55%, no rwma, Gr 1 DD, triv AI/MR, mildly dil LA;  f. 12/2013 Lexi MV: intermediate risk - apical ischemia and inf/infsept fixed defect, ? artifactual.  . CAD in native artery 12/31/2013   Previous stents to LAD and Ramus patent on cath today 12/31/13 also There is severe disease in the ostial first diagonal which is unchanged from most recent cardiac catheterization. The right coronary artery could not be engaged selectively but nonselective angiography showed no significant disease in the proximal and midsegment.     . Cataract    DENIES  . Chronic back pain   . Chronic sinus bradycardia   . CKD (chronic kidney disease), stage III ([place])   . Clotting disorder (H[facility])     DVT  . COLITIS 12/02/2007   Qualifier: Diagnosis of  By: Nils Pyle CMA ([place]), [name]    . Diabetes mellitus without complication ([place])    DENIES  . Diverticulosis of colon (without mention of hemorrhage) 2007   Colonoscopy   . Esophageal stricture    a. 2012 s/p dil.  . Esophagitis, unspecified    a. 2012 EGD  . EXTERNAL HEMORRHOIDS   . GERD (gastroesophageal reflux disease)    omeprazole  . H/O hiatal hernia   . Hiatal hernia    a. 2012 EGD.  Marland Kitchen History of DVT in the past, not on Coumadin now    left  leg  . HYPERCHOLESTEROLEMIA   . Hypertension   . ICD (implantable cardiac defibrillator) in place    a. s/p initial ICD in 1993 in setting of cardiac arrest;  b. 01/2007 gen change: Guidant T135 Vitality DS VR single lead ICD.  . Macular degeneration    gets injecton in eye every 5 weeks- last injection - 05/03/2013   . Myocardial infarction ([place]) 1993  . Near syncope 05/22/2015  . Nephrolithiasis, just saw Dr [name]   . Orthostatic hypotension   . Peripheral vascular disease ([place])    ???  . RECTAL BLEEDING 12/03/2007   Qualifier: Diagnosis of  By: Sharlett Iles MD Byrd Hesselbach   . Unspecified gastritis and gastroduodenitis without mention of  hemorrhage    a. 2003 EGD->not noted on 2012 EGD.  Marland Kitchen Unstable angina, neg MI, cath stable.maybe GI 12/30/2013    Past Surgical History:  Procedure Laterality Date  . BREAST BIOPSY Left 11/2013  . CARDIAC CATHETERIZATION    . CARDIAC CATHETERIZATION  01/01/15   patent stents -there is severe disease in ostial 1st diag but without change.   Marland Kitchen CARDIAC DEFIBRILLATOR PLACEMENT  02/05/2007   Guidant  . CARDIOVASCULAR STRESS TEST  12/30/2013   abnormal  . CARDIOVERSION N/A 02/13/2019   Procedure: CARDIOVERSION;  Surgeon: Sanda Klein, MD;  Location: [facility];  Service: Cardiovascular;  Laterality: N/A;  . CATARACT EXTRACTION Right    Dr. Katy Fitch  . COLONOSCOPY    . coronary stents     . CYSTOSCOPY WITH URETEROSCOPY Right 05/20/2013    Procedure: CYSTOSCOPY, RIGHT URETEROSCOPY STONE EXTRACTION, Insertion of right DOUBLE J STENT ;  Surgeon: Irine Seal, MD;  Location: [facility];  Service: Urology;  Laterality: Right;  . CYSTOSCOPY WITH URETEROSCOPY AND STENT PLACEMENT N/A 06/03/2013   Procedure: SECOND LOOK CYSTOSCOPY WITH URETEROSCOPY  HOLMIUM LASER LITHO AND STONE EXTRACTION Sammie Bench ;  Surgeon: Malka So, MD;  Location: [facility];  Service: Urology;  Laterality: N/A;  . ESOPHAGOGASTRODUODENOSCOPY (EGD) WITH PROPOFOL N/A 11/20/2019   Procedure: ESOPHAGOGASTRODUODENOSCOPY (EGD) WITH PROPOFOL;  Surgeon: Daneil Dolin, MD;  [facility];  Service: Endoscopy;  Laterality: N/A;  . GIVENS CAPSULE STUDY N/A 10/30/2016   Procedure: GIVENS CAPSULE STUDY;  Surgeon: Irene Shipper, MD;  Location: [facility];  Service: Endoscopy;  Laterality: N/A;  NEEDS TO BE ADMITTED FOR OBSERVATION PT. HAS DEFIB  . HEMORROIDECTOMY  80's  . HOLMIUM LASER APPLICATION Right 5/0/9326   Procedure: HOLMIUM LASER APPLICATION;  Surgeon: Irine Seal, MD;  Location: [facility];  Service: Urology;  Laterality: Right;  . HYSTEROSCOPY WITH D & C  09/02/2010   Procedure: DILATATION AND CURETTAGE (D&C) /HYSTEROSCOPY;  Surgeon: Margarette Asal;  Location: [facility];  Service: Gynecology;  Laterality: N/A;  Dilation and Curettage with Hysteroscopy and Polypectomy  . IMPLANTABLE CARDIOVERTER DEFIBRILLATOR (ICD) GENERATOR CHANGE N/A 02/10/2014   Procedure: ICD GENERATOR CHANGE;  Surgeon: Sanda Klein, MD;  Location: [facility];  Service: Cardiovascular;  Laterality: N/A;  . LEFT HEART CATHETERIZATION WITH CORONARY ANGIOGRAM N/A 07/28/2011   Procedure: LEFT HEART CATHETERIZATION WITH CORONARY ANGIOGRAM;  Surgeon: Lorretta Harp, MD;  Location: [facility];  Service: Cardiovascular;  Laterality: N/A;  . LEFT HEART CATHETERIZATION WITH CORONARY ANGIOGRAM N/A 12/31/2013   Procedure: LEFT HEART CATHETERIZATION WITH CORONARY ANGIOGRAM;  Surgeon: Wellington Hampshire, MD;   Location: [facility];  Service: Cardiovascular;  Laterality: N/A;    Family History  Problem Relation Age of Onset  . Stroke Mother   . Other Mother        brain tumor  . Other Father        MI  . Heart attack Father   . Stroke Sister   . Macular degeneration Sister   . Diabetes Daughter   . Cancer Daughter        ovarian  . Colon polyps Daughter   . Atrial fibrillation Sister   . Hyperlipidemia Sister   . Osteoporosis Sister   . Stroke Sister   . Uterine cancer Sister   . Heart attack Brother   . Heart disease Brother   . Asthma Brother   . Breast cancer Neg Hx     Social History  Socioeconomic History  . Marital status: Widowed    Spouse name: Not on file  . Number of children: 4  . Years of education: 8  . Highest education level: 8th grade  Occupational History  . Occupation: Retired  Tobacco Use  . Smoking status: Never Smoker  . Smokeless tobacco: Never Used  Vaping Use  . Vaping Use: Never used  Substance and Sexual Activity  . Alcohol use: No  . Drug use: No  . Sexual activity: Never    Birth control/protection: Post-menopausal  Other Topics Concern  . Not on file  Social History Narrative  . Not on file   Social Determinants of Health   Financial Resource Strain: Low Risk   . Difficulty of Paying Living Expenses: Not hard at all  Food Insecurity: No Food Insecurity  . Worried About Charity fundraiser in the Last Year: Never true  . Ran Out of Food in the Last Year: Never true  Transportation Needs: No Transportation Needs  . Lack of Transportation (Medical): No  . Lack of Transportation (Non-Medical): No  Physical Activity: Inactive  . Days of Exercise per Week: 0 days  . Minutes of Exercise per Session: 0 min  Stress: No Stress Concern Present  . Feeling of Stress : Only a little  Social Connections: Moderately Integrated  . Frequency of Communication with Friends and Family: More than three times a week  . Frequency of Social  Gatherings with Friends and Family: Once a week  . Attends Religious Services: More than 4 times per year  . Active Member of Clubs or Organizations: Yes  . Attends Archivist Meetings: More than 4 times per year  . Marital Status: Widowed  Intimate Partner Violence: Not At Risk  . Fear of Current or Ex-Partner: No  . Emotionally Abused: No  . Physically Abused: No  . Sexually Abused: No    Outpatient Medications Prior to Visit  Medication Sig Dispense Refill  . amiodarone (PACERONE) 200 MG tablet TAKE 1 TABLET DAILY 30 tablet 3  . amitriptyline (ELAVIL) 10 MG tablet Take 1 tablet (10 mg total) by mouth at bedtime. 30 tablet 1  . apixaban (ELIQUIS) 5 MG TABS tablet Take 1 tablet (5 mg total) by mouth 2 (two) times daily. 180 tablet 1  . carboxymethylcellulose (REFRESH PLUS) 0.5 % SOLN Place 1 drop into both eyes 3 (three) times daily as needed (dry/irritated eyes.).    Marland Kitchen Cholecalciferol (VITAMIN D) 50 MCG (2000 UT) CAPS Take 1 capsule by mouth daily.    . diphenoxylate-atropine (LOMOTIL) 2.5-0.025 MG tablet TAKE 1 TABLET EVERY 8 HOURS AS NEEDED FOR DIARRHEA (Patient taking differently: Take 1 tablet by mouth every 8 (eight) hours as needed for diarrhea or loose stools. ) 30 tablet 0  . hydrALAZINE (APRESOLINE) 10 MG tablet TAKE 1 TABLET EVERY 8 HOURS (Patient taking differently: Take 10 mg by mouth every 8 (eight) hours. ) 90 tablet 4  . hydrocortisone (ANUSOL-HC) 25 MG suppository Place 1 suppository (25 mg total) rectally 2 (two) times daily. 10 suppository 0  . isosorbide mononitrate (IMDUR) 30 MG 24 hr tablet TAKE ONE HALF (1/2) TABLET DAILY (Patient taking differently: Take 15 mg by mouth daily. ) 90 tablet 2  . levothyroxine (SYNTHROID) 75 MCG tablet TAKE 1 TABLET DAILY BEFORE BREAKFAST 90 tablet 0  . linaclotide (LINZESS) 145 MCG CAPS capsule Take 1 capsule (145 mcg total) by mouth daily before breakfast. 20 capsule 0  . loratadine (CLARITIN) 10 MG  tablet Take 1 tablet (10  mg total) by mouth daily. (for itching) (Patient taking differently: Take 10 mg by mouth as needed for allergies or itching. ) 30 tablet 11  . meclizine (ANTIVERT) 25 MG tablet Take 25 mg by mouth 3 (three) times daily as needed for dizziness or nausea.     . metoprolol tartrate (LOPRESSOR) 25 MG tablet TAKE  (1)  TABLET TWICE A DAY. 180 tablet 2  . Multiple Vitamins-Minerals (CENTRUM SILVER ULTRA WOMENS PO) Take 1 tablet by mouth daily.    . nitroGLYCERIN (NITROSTAT) 0.4 MG SL tablet Place 1 tablet (0.4 mg total) under the tongue every 5 (five) minutes as needed for chest pain. 25 tablet 2  . pantoprazole (PROTONIX) 40 MG tablet Take 1 tablet (40 mg total) by mouth every evening. (Patient taking differently: Take 40 mg by mouth daily. ) 90 tablet 3  . PARoxetine (PAXIL) 20 MG tablet TAKE (1) TABLET DAILY IN THE MORNING. 90 tablet 0  . rosuvastatin (CRESTOR) 40 MG tablet TAKE (1/2) TABLET DAILY. (Patient taking differently: Take 20 mg by mouth daily. ) 45 tablet 0  . torsemide (DEMADEX) 20 MG tablet Take 1 tablet (20 mg total) by mouth daily as needed (edema). 30 tablet 0  . traMADol (ULTRAM) 50 MG tablet Take 0.5 tablets (25 mg total) by mouth every 12 (twelve) hours as needed for moderate pain or severe pain. 10 tablet 0   No facility-administered medications prior to visit.    Allergies  Allergen Reactions  . Sotalol Other (See Comments)    Torsades   . Bactrim [Sulfamethoxazole-Trimethoprim]     Itchy rash  . Ciprofloxacin Nausea Only  . Actos [Pioglitazone] Swelling  . Adhesive [Tape] Other (See Comments)    Causes sores  . Atorvastatin Other (See Comments)    myalgia myalgia myalgia  . Contrast Media [Iodinated Diagnostic Agents] Other (See Comments)    Headache (no action/pre-med required)  . Latex Rash  . Pedi-Pre Tape Spray [Wound Dressing Adhesive] Other (See Comments)    Causes sores  . Sulfa Antibiotics Other (See Comments)    Causes sores    ROS Review of Systems   Constitutional: Negative for fatigue and fever.  Cardiovascular: Positive for chest pain.  All other systems reviewed and are negative.     Objective:    Physical Exam Exam conducted with a chaperone present.  Constitutional:      Appearance: She is ill-appearing.  HENT:     Nose: No congestion.  Eyes:     Conjunctiva/sclera: Conjunctivae normal.  Cardiovascular:     Rate and Rhythm: Regular rhythm. Bradycardia present.     Pulses: Normal pulses.     Comments: Abnormal EKG in clinic. Pulmonary:     Effort: Pulmonary effort is normal.     Breath sounds: Normal breath sounds. No wheezing.  Chest:     Chest wall: Tenderness present.  Abdominal:     General: Bowel sounds are normal.  Musculoskeletal:        General: Tenderness present.  Neurological:     Mental Status: She is alert.     BP 138/79   Pulse 62   Temp (!) 96.8 F (36 C) (Temporal)   Ht 5\' 2"  (1.575 m)   Wt 136 lb 3.2 oz (61.8 kg)   BMI 24.91 kg/m  Wt Readings from Last 3 Encounters:  12/22/19 136 lb 3.2 oz (61.8 kg)  11/27/19 137 lb 12.8 oz (62.5 kg)  11/26/19 138 lb (62.6 kg)  Health Maintenance Due  Topic Date Due  . COVID-19 Vaccine (1) Never done  . DEXA SCAN  Never done  . MAMMOGRAM  01/11/2019  . INFLUENZA VACCINE  08/17/2019  . FOOT EXAM  10/23/2019    There are no preventive care reminders to display for this patient.  Lab Results  Component Value Date   TSH 6.950 (H) 09/24/2019   Lab Results  Component Value Date   WBC 9.1 12/22/2019   HGB 11.7 (L) 12/22/2019   HCT 34.5 (L) 12/22/2019   MCV 95.0 12/22/2019   PLT 286 12/22/2019   Lab Results  Component Value Date   NA 131 (L) 12/22/2019   K 4.2 12/22/2019   CO2 23 12/22/2019   GLUCOSE 142 (H) 12/22/2019   BUN 20 12/22/2019   CREATININE 1.63 (H) 12/22/2019   BILITOT 0.7 11/20/2019   ALKPHOS 55 11/20/2019   AST 40 11/20/2019   ALT 31 11/20/2019   PROT 5.8 (L) 11/20/2019   ALBUMIN 3.1 (L) 11/20/2019   CALCIUM 9.3  12/22/2019   ANIONGAP 9 12/22/2019   GFR 45.49 (L) 12/08/2013   Lab Results  Component Value Date   CHOL 139 10/23/2018   Lab Results  Component Value Date   HDL 53 10/23/2018   Lab Results  Component Value Date   LDLCALC 65 10/23/2018   Lab Results  Component Value Date   TRIG 211 (H) 12/28/2018   Lab Results  Component Value Date   CHOLHDL 2.6 10/23/2018   Lab Results  Component Value Date   HGBA1C 6.6 08/15/2019      Assessment & Plan:  Osteoarthritis of right knee Not well controlled.  Did not give knee injections due to patient's abnormal EKG and chest pain.  Will reassess tomorrow patient on the way to the emergency department.  Chest pain, r/o cardiac, negative MI Chest pain in the last 3 days.  Uncontrolled with nitroglycerin.  Completed EKG with abnormal results.  Wide-complex bradycardia-with run of ventricular ectopic beats.  Patient sent to the ED   Education provided with printed handouts given.  Problem List Items Addressed This Visit    None       Follow-up: No follow-ups on file.    Ivy Lynn, NP

## 2019-12-22 NOTE — Assessment & Plan Note (Signed)
Chest pain in the last 3 days.  Uncontrolled with nitroglycerin.  Completed EKG with abnormal results.  Wide-complex bradycardia-with run of ventricular ectopic beats.  Patient sent to the ED   Education provided with printed handouts given.

## 2019-12-22 NOTE — ED Triage Notes (Signed)
Pt here from MD office with c/o abnormal ekg and some chest pain over the last 3 days no sob or n/v

## 2019-12-23 DIAGNOSIS — Z87440 Personal history of urinary (tract) infections: Secondary | ICD-10-CM | POA: Diagnosis not present

## 2019-12-23 DIAGNOSIS — F32A Depression, unspecified: Secondary | ICD-10-CM | POA: Diagnosis present

## 2019-12-23 DIAGNOSIS — E1122 Type 2 diabetes mellitus with diabetic chronic kidney disease: Secondary | ICD-10-CM | POA: Diagnosis present

## 2019-12-23 DIAGNOSIS — Z8616 Personal history of COVID-19: Secondary | ICD-10-CM | POA: Diagnosis not present

## 2019-12-23 DIAGNOSIS — N179 Acute kidney failure, unspecified: Secondary | ICD-10-CM | POA: Diagnosis present

## 2019-12-23 DIAGNOSIS — Z95810 Presence of automatic (implantable) cardiac defibrillator: Secondary | ICD-10-CM | POA: Diagnosis not present

## 2019-12-23 DIAGNOSIS — I480 Paroxysmal atrial fibrillation: Secondary | ICD-10-CM | POA: Diagnosis present

## 2019-12-23 DIAGNOSIS — E440 Moderate protein-calorie malnutrition: Secondary | ICD-10-CM | POA: Diagnosis present

## 2019-12-23 DIAGNOSIS — R072 Precordial pain: Secondary | ICD-10-CM | POA: Diagnosis not present

## 2019-12-23 DIAGNOSIS — Z87442 Personal history of urinary calculi: Secondary | ICD-10-CM | POA: Diagnosis not present

## 2019-12-23 DIAGNOSIS — K2100 Gastro-esophageal reflux disease with esophagitis, without bleeding: Secondary | ICD-10-CM | POA: Diagnosis present

## 2019-12-23 DIAGNOSIS — I5022 Chronic systolic (congestive) heart failure: Secondary | ICD-10-CM | POA: Diagnosis present

## 2019-12-23 DIAGNOSIS — F419 Anxiety disorder, unspecified: Secondary | ICD-10-CM | POA: Diagnosis present

## 2019-12-23 DIAGNOSIS — N189 Chronic kidney disease, unspecified: Secondary | ICD-10-CM

## 2019-12-23 DIAGNOSIS — R0789 Other chest pain: Secondary | ICD-10-CM | POA: Diagnosis present

## 2019-12-23 DIAGNOSIS — E871 Hypo-osmolality and hyponatremia: Secondary | ICD-10-CM | POA: Diagnosis present

## 2019-12-23 DIAGNOSIS — Z86718 Personal history of other venous thrombosis and embolism: Secondary | ICD-10-CM | POA: Diagnosis not present

## 2019-12-23 DIAGNOSIS — K219 Gastro-esophageal reflux disease without esophagitis: Secondary | ICD-10-CM | POA: Diagnosis present

## 2019-12-23 DIAGNOSIS — N1832 Chronic kidney disease, stage 3b: Secondary | ICD-10-CM | POA: Diagnosis present

## 2019-12-23 DIAGNOSIS — E785 Hyperlipidemia, unspecified: Secondary | ICD-10-CM | POA: Diagnosis present

## 2019-12-23 DIAGNOSIS — I2510 Atherosclerotic heart disease of native coronary artery without angina pectoris: Secondary | ICD-10-CM | POA: Diagnosis present

## 2019-12-23 DIAGNOSIS — R9431 Abnormal electrocardiogram [ECG] [EKG]: Secondary | ICD-10-CM | POA: Diagnosis present

## 2019-12-23 DIAGNOSIS — Z20822 Contact with and (suspected) exposure to covid-19: Secondary | ICD-10-CM | POA: Diagnosis present

## 2019-12-23 DIAGNOSIS — N390 Urinary tract infection, site not specified: Secondary | ICD-10-CM | POA: Diagnosis present

## 2019-12-23 DIAGNOSIS — K580 Irritable bowel syndrome with diarrhea: Secondary | ICD-10-CM | POA: Diagnosis present

## 2019-12-23 DIAGNOSIS — N200 Calculus of kidney: Secondary | ICD-10-CM | POA: Diagnosis present

## 2019-12-23 DIAGNOSIS — I130 Hypertensive heart and chronic kidney disease with heart failure and stage 1 through stage 4 chronic kidney disease, or unspecified chronic kidney disease: Secondary | ICD-10-CM | POA: Diagnosis present

## 2019-12-23 DIAGNOSIS — Z7901 Long term (current) use of anticoagulants: Secondary | ICD-10-CM

## 2019-12-23 LAB — URINALYSIS, ROUTINE W REFLEX MICROSCOPIC
Bilirubin Urine: NEGATIVE
Glucose, UA: NEGATIVE mg/dL
Ketones, ur: NEGATIVE mg/dL
Nitrite: NEGATIVE
Protein, ur: 30 mg/dL — AB
RBC / HPF: >50 RBC/hpf — ABNORMAL HIGH (ref 0–5)
Specific Gravity, Urine: 1.012 (ref 1.005–1.030)
WBC, UA: >50 WBC/hpf — ABNORMAL HIGH (ref 0–5)
pH: 5 (ref 5.0–8.0)

## 2019-12-23 LAB — LIPID PANEL
Cholesterol: 164 mg/dL (ref 0–200)
HDL: 66 mg/dL (ref >40)
LDL Cholesterol: 86 mg/dL (ref 0–99)
Total CHOL/HDL Ratio: 2.5 RATIO
Triglycerides: 61 mg/dL (ref <150)
VLDL: 12 mg/dL (ref 0–40)

## 2019-12-23 LAB — RESP PANEL BY RT-PCR (FLU A&B, COVID) ARPGX2
Influenza A by PCR: NEGATIVE
Influenza B by PCR: NEGATIVE
SARS Coronavirus 2 by RT PCR: NEGATIVE

## 2019-12-23 MED ORDER — LORATADINE 10 MG PO TABS: 10.0000 mg | ORAL_TABLET | Freq: Every day | ORAL | Status: DC | PRN

## 2019-12-23 MED ORDER — ASPIRIN 81 MG PO CHEW
324.0000 mg | CHEWABLE_TABLET | Freq: Once | ORAL | Status: AC
  Administered 2019-12-23: 324 mg via ORAL
  Filled 2019-12-23: qty 4

## 2019-12-23 MED ORDER — PANTOPRAZOLE SODIUM 40 MG PO TBEC: 40.0000 mg | DELAYED_RELEASE_TABLET | Freq: Every day | ORAL | Status: DC

## 2019-12-23 MED ORDER — SODIUM CHLORIDE 0.9 % IV SOLN: Freq: Once | INTRAVENOUS | Status: AC

## 2019-12-23 MED ORDER — ROSUVASTATIN CALCIUM 20 MG PO TABS
20.0000 mg | ORAL_TABLET | Freq: Every day | ORAL | Status: DC
  Administered 2019-12-23 – 2019-12-25 (×3): 20 mg via ORAL
  Filled 2019-12-23: qty 4
  Filled 2019-12-23 (×2): qty 1

## 2019-12-23 MED ORDER — AMIODARONE HCL 200 MG PO TABS
200.0000 mg | ORAL_TABLET | Freq: Every day | ORAL | Status: DC
  Administered 2019-12-23 – 2019-12-26 (×4): 200 mg via ORAL
  Filled 2019-12-23 (×4): qty 1

## 2019-12-23 MED ORDER — ASPIRIN EC 81 MG PO TBEC
81.0000 mg | DELAYED_RELEASE_TABLET | Freq: Every day | ORAL | Status: DC
  Filled 2019-12-23: qty 1

## 2019-12-23 MED ORDER — ACETAMINOPHEN 325 MG PO TABS: 650.0000 mg | ORAL_TABLET | ORAL | Status: DC | PRN

## 2019-12-23 MED ORDER — SODIUM CHLORIDE 0.9 % IV SOLN
1.0000 g | INTRAVENOUS | Status: DC
  Administered 2019-12-23 – 2019-12-25 (×3): 1 g via INTRAVENOUS
  Filled 2019-12-23 (×3): qty 10

## 2019-12-23 MED ORDER — LEVOTHYROXINE SODIUM 75 MCG PO TABS
75.0000 ug | ORAL_TABLET | Freq: Every day | ORAL | Status: DC
  Administered 2019-12-24 – 2019-12-26 (×3): 75 ug via ORAL
  Filled 2019-12-23 (×3): qty 1

## 2019-12-23 MED ORDER — MECLIZINE HCL 25 MG PO TABS
25.0000 mg | ORAL_TABLET | Freq: Three times a day (TID) | ORAL | Status: DC | PRN
  Administered 2019-12-24: 25 mg via ORAL
  Filled 2019-12-23: qty 1

## 2019-12-23 MED ORDER — HYDRALAZINE HCL 10 MG PO TABS
10.0000 mg | ORAL_TABLET | Freq: Three times a day (TID) | ORAL | Status: DC
  Administered 2019-12-23 – 2019-12-26 (×8): 10 mg via ORAL
  Filled 2019-12-23 (×9): qty 1

## 2019-12-23 MED ORDER — POLYVINYL ALCOHOL 1.4 % OP SOLN
1.0000 [drp] | Freq: Three times a day (TID) | OPHTHALMIC | Status: DC | PRN
  Filled 2019-12-23: qty 15

## 2019-12-23 MED ORDER — SODIUM CHLORIDE 0.9 % IV SOLN
INTRAVENOUS | Status: DC
Start: 2019-12-23 — End: 2019-12-23

## 2019-12-23 MED ORDER — ONDANSETRON HCL 4 MG/2ML IJ SOLN: 4.0000 mg | Freq: Four times a day (QID) | INTRAMUSCULAR | Status: DC | PRN

## 2019-12-23 MED ORDER — INFLUENZA VAC A&B SA ADJ QUAD 0.5 ML IM PRSY
0.5000 mL | PREFILLED_SYRINGE | INTRAMUSCULAR | Status: DC
  Filled 2019-12-23: qty 0.5

## 2019-12-23 MED ORDER — CARBOXYMETHYLCELLULOSE SODIUM 0.5 % OP SOLN: 1.0000 [drp] | Freq: Three times a day (TID) | OPHTHALMIC | Status: DC | PRN

## 2019-12-23 MED ORDER — ISOSORBIDE MONONITRATE ER 30 MG PO TB24
15.0000 mg | ORAL_TABLET | Freq: Every day | ORAL | Status: DC
  Administered 2019-12-23 – 2019-12-26 (×4): 15 mg via ORAL
  Filled 2019-12-23 (×4): qty 1

## 2019-12-23 MED ORDER — PAROXETINE HCL 20 MG PO TABS
20.0000 mg | ORAL_TABLET | Freq: Every day | ORAL | Status: DC
  Administered 2019-12-23 – 2019-12-26 (×4): 20 mg via ORAL
  Filled 2019-12-23 (×4): qty 1

## 2019-12-23 MED ORDER — NITROGLYCERIN 0.4 MG SL SUBL: 0.4000 mg | SUBLINGUAL_TABLET | SUBLINGUAL | Status: DC | PRN

## 2019-12-23 MED ORDER — AMITRIPTYLINE HCL 10 MG PO TABS: 10.0000 mg | ORAL_TABLET | Freq: Every day | ORAL | Status: DC

## 2019-12-23 MED ORDER — DIPHENOXYLATE-ATROPINE 2.5-0.025 MG PO TABS: 1.0000 | ORAL_TABLET | Freq: Three times a day (TID) | ORAL | Status: DC | PRN

## 2019-12-23 MED ORDER — METOPROLOL TARTRATE 25 MG PO TABS
25.0000 mg | ORAL_TABLET | Freq: Two times a day (BID) | ORAL | Status: DC
  Administered 2019-12-23 – 2019-12-26 (×5): 25 mg via ORAL
  Filled 2019-12-23 (×6): qty 1

## 2019-12-23 MED ORDER — APIXABAN 5 MG PO TABS: 5.0000 mg | ORAL_TABLET | Freq: Two times a day (BID) | ORAL | Status: DC

## 2019-12-23 MED ORDER — APIXABAN 2.5 MG PO TABS
2.5000 mg | ORAL_TABLET | Freq: Two times a day (BID) | ORAL | Status: DC
  Administered 2019-12-23 – 2019-12-26 (×6): 2.5 mg via ORAL
  Filled 2019-12-23 (×7): qty 1

## 2019-12-23 NOTE — ED Provider Notes (Signed)
Patient is an elderly 86 year old female, she presents with a complaint of chest discomfort for several days, comes from seeing her family practice clinic where she saw the nurse practitioner.  She had initially been there for a injection of her knee but was noted to have chest pain, EKG showed a sinus bradycardia paced, patient is now chest pain-free, given aspirin, will need to be admitted to the hospital.  High heart score, prior coronary disease.  EKG reviewed, shows paced bradycardic rhythm at 50 bpm, x-ray without infiltrate, troponins negative, patient high risk will be admitted for unstable angina  Medical screening examination/treatment/procedure(s) were conducted as a shared visit with non-physician practitioner(s) and myself.  I personally evaluated the patient during the encounter.  Clinical Impression:   Final diagnoses:  Chest pressure         Noemi Chapel, MD 12/24/19 [phone]

## 2019-12-23 NOTE — ED Provider Notes (Signed)
[facility] EMERGENCY DEPARTMENT Provider Note   CSN: [id number] Arrival date & time: 12/22/19  1302     History No chief complaint on file.   [name] is a 86 y.o. female with a history of atrial fibrillation, CAD (multiple stents placed), implantable cardiac defibrillator, DM, and CKD stage III.  Patient presents from her primary care office yesterday; where she was being seen for joint injections but was sent to ED after her complaints of chest pain.  Patient reports that she had left sided chest pressure that started on Friday, patient reports that the pressure radiated into her neck, pressure became progressively worse over the weekend, patient reports that her pressure was improved with nitro and worse with exertion.  Patient reports that she "knew my heart was skipping a beat."  Patient also reports fatigue, exertional shortness of breath, decreased activity, intermittent chills.  Patient denies any peripheral edema, nausea, vomiting, diaphoresis, pain radiating to the back, or syncopal episodes.  At present patient reports that her pain is resolved.    HPI  HPI: A 86 year old patient with a history of hypertension presents for evaluation of chest pain. Initial onset of pain was more than 6 hours ago. The patient's chest pain is described as heaviness/pressure/tightness, is worse with exertion and is relieved by nitroglycerin. The patient's chest pain is middle- or left-sided, is not well-localized, is not sharp and does radiate to the arms/jaw/neck. The patient does not complain of nausea and denies diaphoresis. The patient has no history of stroke, has no history of peripheral artery disease, has not smoked in the past 90 days, denies any history of treated diabetes, has no relevant family history of coronary artery disease (first degree relative at less than age 3), has no history of hypercholesterolemia and does not have an elevated BMI (>=30).   Past Medical  History:  Diagnosis Date  . AICD (automatic cardioverter/defibrillator) present   . Allergy    SESONAL  . Anxiety   . ARTHRITIS   . Arthritis   . Atrial fibrillation ([place])   . CAD (coronary artery disease)    a. history of cardiac arrest 1993;  b. s/p LAD/LCX stenting in 2003;  c. 07/2011 Cath: LM nl, LAD patent stent, D1 80ost, LCX patent stent, RCA min irregs;  d. 04/2012 MV: EF 66%, no ishcemia;  e. 12/2013 Echo: Ef 55%, no rwma, Gr 1 DD, triv AI/MR, mildly dil LA;  f. 12/2013 Lexi MV: intermediate risk - apical ischemia and inf/infsept fixed defect, ? artifactual.  . CAD in native artery 12/31/2013   Previous stents to LAD and Ramus patent on cath today 12/31/13 also There is severe disease in the ostial first diagonal which is unchanged from most recent cardiac catheterization. The right coronary artery could not be engaged selectively but nonselective angiography showed no significant disease in the proximal and midsegment.     . Cataract    DENIES  . Chronic back pain   . Chronic sinus bradycardia   . CKD (chronic kidney disease), stage III ([place])   . Clotting disorder (HCC)    DVT  . COLITIS 12/02/2007   Qualifier: Diagnosis of  By: Nils Pyle CMA ([place]), [name]    . Diabetes mellitus without complication ([place])    DENIES  . Diverticulosis of colon (without mention of hemorrhage) 2007   Colonoscopy   . Esophageal stricture    a. 2012 s/p dil.  . Esophagitis, unspecified    a. 2012 EGD  .  EXTERNAL HEMORRHOIDS   . GERD (gastroesophageal reflux disease)    omeprazole  . H/O hiatal hernia   . Hiatal hernia    a. 2012 EGD.  Marland Kitchen History of DVT in the past, not on Coumadin now    left  leg  . HYPERCHOLESTEROLEMIA   . Hypertension   . ICD (implantable cardiac defibrillator) in place    a. s/p initial ICD in 1993 in setting of cardiac arrest;  b. 01/2007 gen change: Guidant T135 Vitality DS VR single lead ICD.  . Macular degeneration    gets injecton in eye every 5 weeks- last  injection - 05/03/2013   . Myocardial infarction ([place]) 1993  . Near syncope 05/22/2015  . Nephrolithiasis, just saw Dr [name]   . Orthostatic hypotension   . Peripheral vascular disease ([place])    ???  . RECTAL BLEEDING 12/03/2007   Qualifier: Diagnosis of  By: Sharlett Iles MD Byrd Hesselbach   . Unspecified gastritis and gastroduodenitis without mention of hemorrhage    a. 2003 EGD->not noted on 2012 EGD.  Marland Kitchen Unstable angina, neg MI, cath stable.maybe GI 12/30/2013    Patient Active Problem List   Diagnosis Date Noted  . Prolonged QT interval 12/23/2019  . Constipation 11/27/2019  . Diarrhea 11/27/2019  . Lesion of pancreas 11/26/2019  . Fibromuscular dysplasia of right renal artery ([place]) 11/26/2019  . Left renal artery stenosis ([place]) 11/26/2019  . RUQ pain   . Abnormal CT of the abdomen   . Vertigo 11/15/2019  . Advanced care planning/counseling discussion 11/15/2019  . CAD (coronary artery disease)   . Hypothyroidism   . Hyponatremia   . Volume depletion   . TSH elevation 08/01/2019  . Hospital discharge follow-up 08/01/2019  . Exudative age-related macular degeneration of right eye with active choroidal neovascularization ([place]) 04/30/2019  . Retinal hemorrhage of right eye 04/30/2019  . Advanced nonexudative age-related macular degeneration of left eye with subfoveal involvement 04/30/2019  . Exudative age-related macular degeneration of left eye with inactive choroidal neovascularization ([place]) 04/30/2019  . Advanced nonexudative age-related macular degeneration of right eye without subfoveal involvement 04/30/2019  . Posterior vitreous detachment of both eyes 04/30/2019  . Secondary hypercoagulable state ([place]) 03/11/2019  . Persistent atrial fibrillation ([place])   . Proctitis 01/09/2019  . Atrial fibrillation, chronic ([place]) 01/09/2019  . Hyperlipidemia 01/09/2019  . Nausea and vomiting 01/09/2019  . Hyperglycemia 01/09/2019  . COVID-19 virus infection 12/28/2018  . Chronic  diarrhea 09/18/2018  . Abdominal pain 09/18/2018  . Osteoarthritis of right knee 03/29/2018  . GERD (gastroesophageal reflux disease)   . Diarrhea of presumed infectious origin 02/06/2018  . Essential hypertension 12/21/2017  . Degenerative scoliosis 02/19/2017  . Burst fracture of lumbar vertebra ([place]) 02/19/2017  . History of drug-induced prolonged QT interval with torsade de pointes 11/01/2016  . Iron deficiency anemia 10/30/2016  . Long term current use of anticoagulant 02/17/2016  . Near syncope 05/22/2015  . Diabetes mellitus type 2, diet-controlled ([place]) 09/14/2014  . ICD (implantable cardioverter-defibrillator) battery depletion 01/20/2014  . Abnormal nuclear stress test, 12/30/13 12/31/2013  . Coronary artery disease involving native coronary artery of native heart without angina pectoris 12/31/2013  . Paroxysmal atrial fibrillation, chads2 Vasc2 score of 5, on eliquis 10/16/2013  . Catecholaminergic polymorphic ventricular tachycardia ([place]) 10/16/2013  . Ureteral stone with hydronephrosis 05/20/2013  . Metabolic syndrome 26/83/4196  . Orthostatic hypotension 07/27/2011  . Chest pain 07/25/2011  . Weakness 07/25/2011    Class: Acute  . Chronic sinus  bradycardia 07/25/2011  . ICD (implantable cardioverter-defibrillator) in place 07/25/2011  . CKD (chronic kidney disease) stage 3, GFR 30-59 ml/min (HCC) 07/25/2011    Class: Chronic  . History of DVT in the past, on eliquis now 07/25/2011    Class: History of  . Nephrolithiasis, just saw Dr [name] 07/25/2011    Class: History of  . DYSPHAGIA 03/24/2010  . Chronic ischemic heart disease 12/03/2007  . Irritable bowel syndrome with diarrhea 12/03/2007  . EPIGASTRIC PAIN 12/03/2007  . Hyperlipidemia associated with type 2 diabetes mellitus ([place]) 12/02/2007    Class: History of  . EXTERNAL HEMORRHOIDS 12/02/2007    Class: History of  . ESOPHAGITIS 12/02/2007  . GASTRITIS 12/02/2007  . DIVERTICULOSIS, COLON 12/02/2007   . ARTHRITIS 12/02/2007    Class: Chronic    Past Surgical History:  Procedure Laterality Date  . BREAST BIOPSY Left 11/2013  . CARDIAC CATHETERIZATION    . CARDIAC CATHETERIZATION  01/01/15   patent stents -there is severe disease in ostial 1st diag but without change.   Marland Kitchen CARDIAC DEFIBRILLATOR PLACEMENT  02/05/2007   Guidant  . CARDIOVASCULAR STRESS TEST  12/30/2013   abnormal  . CARDIOVERSION N/A 02/13/2019   Procedure: CARDIOVERSION;  Surgeon: Sanda Klein, MD;  Location: [facility];  Service: Cardiovascular;  Laterality: N/A;  . CATARACT EXTRACTION Right    Dr. Katy Fitch  . COLONOSCOPY    . coronary stents     . CYSTOSCOPY WITH URETEROSCOPY Right 05/20/2013   Procedure: CYSTOSCOPY, RIGHT URETEROSCOPY STONE EXTRACTION, Insertion of right DOUBLE J STENT ;  Surgeon: Irine Seal, MD;  Location: [facility];  Service: Urology;  Laterality: Right;  . CYSTOSCOPY WITH URETEROSCOPY AND STENT PLACEMENT N/A 06/03/2013   Procedure: SECOND LOOK CYSTOSCOPY WITH URETEROSCOPY  HOLMIUM LASER LITHO AND STONE EXTRACTION Sammie Bench ;  Surgeon: Malka So, MD;  Location: [facility];  Service: Urology;  Laterality: N/A;  . ESOPHAGOGASTRODUODENOSCOPY (EGD) WITH PROPOFOL N/A 11/20/2019   Procedure: ESOPHAGOGASTRODUODENOSCOPY (EGD) WITH PROPOFOL;  Surgeon: Daneil Dolin, MD;  [facility];  Service: Endoscopy;  Laterality: N/A;  . GIVENS CAPSULE STUDY N/A 10/30/2016   Procedure: GIVENS CAPSULE STUDY;  Surgeon: Irene Shipper, MD;  Location: [facility];  Service: Endoscopy;  Laterality: N/A;  NEEDS TO BE ADMITTED FOR OBSERVATION PT. HAS DEFIB  . HEMORROIDECTOMY  80's  . HOLMIUM LASER APPLICATION Right 1/0/9323   Procedure: HOLMIUM LASER APPLICATION;  Surgeon: Irine Seal, MD;  Location: [facility];  Service: Urology;  Laterality: Right;  . HYSTEROSCOPY WITH D & C  09/02/2010   Procedure: DILATATION AND CURETTAGE (D&C) /HYSTEROSCOPY;  Surgeon: Margarette Asal;  Location: [facility];  Service: Gynecology;   Laterality: N/A;  Dilation and Curettage with Hysteroscopy and Polypectomy  . IMPLANTABLE CARDIOVERTER DEFIBRILLATOR (ICD) GENERATOR CHANGE N/A 02/10/2014   Procedure: ICD GENERATOR CHANGE;  Surgeon: Sanda Klein, MD;  Location: [facility];  Service: Cardiovascular;  Laterality: N/A;  . LEFT HEART CATHETERIZATION WITH CORONARY ANGIOGRAM N/A 07/28/2011   Procedure: LEFT HEART CATHETERIZATION WITH CORONARY ANGIOGRAM;  Surgeon: Lorretta Harp, MD;  Location: [facility];  Service: Cardiovascular;  Laterality: N/A;  . LEFT HEART CATHETERIZATION WITH CORONARY ANGIOGRAM N/A 12/31/2013   Procedure: LEFT HEART CATHETERIZATION WITH CORONARY ANGIOGRAM;  Surgeon: Wellington Hampshire, MD;  Location: [facility];  Service: Cardiovascular;  Laterality: N/A;     OB History   No obstetric history on file.     Family History  Problem Relation Age of  Onset  . Stroke Mother   . Other Mother        brain tumor  . Other Father        MI  . Heart attack Father   . Stroke Sister   . Macular degeneration Sister   . Diabetes Daughter   . Cancer Daughter        ovarian  . Colon polyps Daughter   . Atrial fibrillation Sister   . Hyperlipidemia Sister   . Osteoporosis Sister   . Stroke Sister   . Uterine cancer Sister   . Heart attack Brother   . Heart disease Brother   . Asthma Brother   . Breast cancer Neg Hx     Social History   Tobacco Use  . Smoking status: Never Smoker  . Smokeless tobacco: Never Used  Vaping Use  . Vaping Use: Never used  Substance Use Topics  . Alcohol use: No  . Drug use: No    Home Medications Prior to Admission medications   Medication Sig Start Date End Date Taking? Authorizing Provider  amiodarone (PACERONE) 200 MG tablet TAKE 1 TABLET DAILY Patient taking differently: Take 200 mg by mouth daily.  11/24/19  Yes Fenton, Clint R, PA  amitriptyline (ELAVIL) 10 MG tablet Take 1 tablet (10 mg total) by mouth at bedtime. 11/22/19  Yes Tat, Shanon Brow, MD  apixaban  (ELIQUIS) 5 MG TABS tablet Take 1 tablet (5 mg total) by mouth 2 (two) times daily. 10/07/19  Yes Croitoru, Mihai, MD  carboxymethylcellulose (REFRESH PLUS) 0.5 % SOLN Place 1 drop into both eyes 3 (three) times daily as needed (dry/irritated eyes.).   Yes [provider]  Cholecalciferol (VITAMIN D) 50 MCG (2000 UT) CAPS Take 2,000 Units by mouth daily.    Yes [provider]  diphenoxylate-atropine (LOMOTIL) 2.5-0.025 MG tablet TAKE 1 TABLET EVERY 8 HOURS AS NEEDED FOR DIARRHEA Patient taking differently: Take 1 tablet by mouth every 8 (eight) hours as needed for diarrhea or loose stools.  03/25/19  Yes Gottschalk, Leatrice Jewels M, DO  hydrALAZINE (APRESOLINE) 10 MG tablet TAKE 1 TABLET EVERY 8 HOURS Patient taking differently: Take 10 mg by mouth every 8 (eight) hours.  10/09/19  Yes Croitoru, Mihai, MD  isosorbide mononitrate (IMDUR) 30 MG 24 hr tablet TAKE ONE HALF (1/2) TABLET DAILY Patient taking differently: Take 15 mg by mouth daily.  09/23/19  Yes Croitoru, Mihai, MD  levothyroxine (SYNTHROID) 75 MCG tablet TAKE 1 TABLET DAILY BEFORE BREAKFAST Patient taking differently: Take 75 mcg by mouth daily before breakfast.  12/17/19  Yes Gottschalk, Ashly M, DO  loratadine (CLARITIN) 10 MG tablet Take 1 tablet (10 mg total) by mouth daily. (for itching) Patient taking differently: Take 10 mg by mouth daily as needed for allergies or itching.  01/20/19  Yes Gottschalk, Leatrice Jewels M, DO  meclizine (ANTIVERT) 25 MG tablet Take 25 mg by mouth 3 (three) times daily as needed for dizziness or nausea.  07/22/19  Yes [provider]  metoprolol tartrate (LOPRESSOR) 25 MG tablet TAKE  (1)  TABLET TWICE A DAY. Patient taking differently: Take 25 mg by mouth 2 (two) times daily.  12/16/19  Yes Croitoru, Mihai, MD  Multiple Vitamins-Minerals (CENTRUM SILVER ULTRA WOMENS PO) Take 1 tablet by mouth daily.   Yes [provider]  nitroGLYCERIN (NITROSTAT) 0.4 MG SL tablet Place 1 tablet (0.4 mg total)  under the tongue every 5 (five) minutes as needed for chest pain. Patient taking differently: Place 0.4 mg  under the tongue every 5 (five) minutes as needed for chest pain (max 3 doses).  12/20/18  Yes Claretta Fraise, MD  pantoprazole (PROTONIX) 40 MG tablet Take 1 tablet (40 mg total) by mouth every evening. Patient taking differently: Take 40 mg by mouth daily.  02/21/19  Yes Gottschalk, Ashly M, DO  PARoxetine (PAXIL) 20 MG tablet TAKE (1) TABLET DAILY IN THE MORNING. Patient taking differently: Take 20 mg by mouth daily.  12/15/19  Yes Gottschalk, Ashly M, DO  rosuvastatin (CRESTOR) 40 MG tablet TAKE (1/2) TABLET DAILY. Patient taking differently: Take 20 mg by mouth daily.  10/06/19  Yes Gottschalk, Leatrice Jewels M, DO  torsemide (DEMADEX) 20 MG tablet Take 1 tablet (20 mg total) by mouth daily as needed (edema). 04/01/19  Yes Gottschalk, Leatrice Jewels M, DO  hydrocortisone (ANUSOL-HC) 25 MG suppository Place 1 suppository (25 mg total) rectally 2 (two) times daily. Patient not taking: Reported on 12/23/2019 11/22/19   Orson Eva, MD  linaclotide [facility]) 145 MCG CAPS capsule Take 1 capsule (145 mcg total) by mouth daily before breakfast. Patient not taking: Reported on 12/23/2019 11/26/19   [name], DO  traMADol (ULTRAM) 50 MG tablet Take 0.5 tablets (25 mg total) by mouth every 12 (twelve) hours as needed for moderate pain or severe pain. Patient not taking: Reported on 12/23/2019 11/27/19   Carlis Stable, NP    Allergies    Sotalol, Bactrim [sulfamethoxazole-trimethoprim], Ciprofloxacin, Tramadol, Actos [pioglitazone], Adhesive [tape], Atorvastatin, Contrast media [iodinated diagnostic agents], Latex, Pedi-pre tape spray [wound dressing adhesive], and Sulfa antibiotics  Review of Systems   Review of Systems  Constitutional: Positive for activity change (decreased), chills and fatigue. Negative for fever.  HENT: Negative for rhinorrhea and sore throat.   Respiratory: Positive for cough  (intermittent dry) and shortness of breath.   Cardiovascular: Positive for chest pain and palpitations. Negative for leg swelling.  Gastrointestinal: Negative for abdominal pain, nausea and vomiting.  Genitourinary: Negative for difficulty urinating and dysuria.  Musculoskeletal: Negative for back pain and neck pain.  Skin: Negative for color change and rash.  Neurological: Negative for dizziness, syncope, light-headedness and headaches.  Psychiatric/Behavioral: Negative for confusion.    Physical Exam Updated Vital Signs BP (!) 166/84   Pulse (!) 58   Temp 97.8 F (36.6 C) (Oral)   Resp 18   SpO2 100%   Physical Exam Constitutional:      General: She is not in acute distress.    Appearance: She is not ill-appearing, toxic-appearing or diaphoretic.  HENT:     Head: Normocephalic.  Eyes:     Pupils: Pupils are equal, round, and reactive to light.  Cardiovascular:     Rate and Rhythm: Regular rhythm. Bradycardia present.     Pulses:          Radial pulses are 3+ on the right side.     Heart sounds: Normal heart sounds.  Pulmonary:     Effort: Pulmonary effort is normal. No respiratory distress.     Breath sounds: Normal breath sounds.  Abdominal:     Palpations: Abdomen is soft.     Tenderness: There is no abdominal tenderness.  Musculoskeletal:     Right lower leg: No edema.     Left lower leg: No edema.  Skin:    General: Skin is warm and dry.  Neurological:     General: No focal deficit present.     Mental Status: She is alert.  Psychiatric:  Behavior: Behavior is cooperative.     ED Results / Procedures / Treatments   Labs (all labs ordered are listed, but only abnormal results are displayed) Labs Reviewed  BASIC METABOLIC PANEL - Abnormal; Notable for the following components:      Result Value   Sodium 131 (*)    Glucose, Bld 142 (*)    Creatinine, Ser 1.63 (*)    GFR, Estimated 31 (*)    All other components within normal limits  CBC -  Abnormal; Notable for the following components:   RBC 3.63 (*)    Hemoglobin 11.7 (*)    HCT 34.5 (*)    All other components within normal limits  URINALYSIS, ROUTINE W REFLEX MICROSCOPIC - Abnormal; Notable for the following components:   Color, Urine AMBER (*)    APPearance CLOUDY (*)    Hgb urine dipstick LARGE (*)    Protein, ur 30 (*)    Leukocytes,Ua MODERATE (*)    RBC / HPF >50 (*)    WBC, UA >50 (*)    Bacteria, UA RARE (*)    Non Squamous Epithelial 0-5 (*)    All other components within normal limits  RESP PANEL BY RT-PCR (FLU A&B, COVID) ARPGX2  URINE CULTURE  LIPID PANEL  MAGNESIUM  CBC WITH DIFFERENTIAL/PLATELET  BASIC METABOLIC PANEL  TROPONIN I (HIGH SENSITIVITY)  TROPONIN I (HIGH SENSITIVITY)    EKG EKG Interpretation  Date/Time:  Monday December 22 2019 13:07:59 EST Ventricular Rate:  50 PR Interval:  230 QRS Duration: 184 QT Interval:  564 QTC Calculation: 514 R Axis:   -65 Text Interpretation: Atrial-sensed ventricular-paced rhythm with prolonged AV conduction Abnormal ECG Since last tracing pacing spikes now seen Confirmed by Noemi Chapel [phone]) on 12/23/2019 8:09:16 AM   Radiology DG Chest 2 View  Result Date: 12/22/2019 CLINICAL DATA:  Central chest pain for 2 days. EXAM: CHEST - 2 VIEW COMPARISON:  11/14/2019 FINDINGS: An ICD remains in place. The cardiac silhouette remains mildly enlarged. Aortic atherosclerosis is noted. There is mild chronic coarsening of the interstitial markings. No confluent airspace opacity, overt pulmonary edema, sizable pleural effusion, or pneumothorax is identified. The bones appear diffusely osteopenic, and there is a chronic lower thoracic compression fracture. IMPRESSION: No active cardiopulmonary disease. Electronically Signed   By: Logan Bores M.D.   On: 12/22/2019 14:44    Procedures Procedures (including critical care time)  Medications Ordered in ED Medications  acetaminophen (TYLENOL) tablet 650 mg (has  no administration in time range)  aspirin EC tablet 81 mg (has no administration in time range)  nitroGLYCERIN (NITROSTAT) SL tablet 0.4 mg (has no administration in time range)  cefTRIAXone (ROCEPHIN) 1 g in sodium chloride 0.9 % 100 mL IVPB (0 g Intravenous Stopped 12/23/19 1259)  amiodarone (PACERONE) tablet 200 mg (200 mg Oral Given 12/23/19 1457)  isosorbide mononitrate (IMDUR) 24 hr tablet 15 mg (15 mg Oral Given 12/23/19 1456)  rosuvastatin (CRESTOR) tablet 20 mg (20 mg Oral Given 12/23/19 1456)  metoprolol tartrate (LOPRESSOR) tablet 25 mg (has no administration in time range)  hydrALAZINE (APRESOLINE) tablet 10 mg (10 mg Oral Given 12/23/19 1501)  PARoxetine (PAXIL) tablet 20 mg (20 mg Oral Given 12/23/19 1457)  levothyroxine (SYNTHROID) tablet 75 mcg (has no administration in time range)  diphenoxylate-atropine (LOMOTIL) 2.5-0.025 MG per tablet 1 tablet (has no administration in time range)  loratadine (CLARITIN) tablet 10 mg (has no administration in time range)  meclizine (ANTIVERT) tablet 25 mg (has no administration  in time range)  polyvinyl alcohol (LIQUIFILM TEARS) 1.4 % ophthalmic solution 1 drop (has no administration in time range)  apixaban (ELIQUIS) tablet 2.5 mg (has no administration in time range)  aspirin chewable tablet 324 mg (324 mg Oral Given 12/23/19 0931)  0.9 %  sodium chloride infusion (0 mLs Intravenous Stopped 12/23/19 1259)    ED Course  I have reviewed the triage vital signs and the nursing notes.  Pertinent labs & imaging results that were available during my care of the patient were reviewed by me and considered in my medical decision making (see chart for details).    MDM Rules/Calculators/A&P HEAR Score: 7                        Patient is seen after a prolonged wait in the waiting room.  Repeat EKG shows bradycardia with ventricular paced rhythm, troponin is 8 with delta of 0.  Patient story is concerning for unstable angina.  Her heart score is elevated  at 7.  Patient is pain-free at this time, however given 324 mg of aspirin as precaution.  Creatinine elevated at 1.63.  CBC showed RBC, hemoglobin, hematocrit slightly decreased.  UA and culture are pending.  Consulted with cardiology who will see the patient.  Consulted with hospitalist for admission, spoke to Dr. Tamala Julian who agreed to admit the patient.  Patient was informed of plan for admission and is agreeable.  Patient requested her son Margree Gimbel be contacted and informed of her admission, and he was contactedvia phone.  10:09 22 spoke with patient who reported feeling left-sided chest pressure.  Repeat EKG was ordered and patient was given sublingual nitro.  Will follow up with patient.   Upon reentering room to reassess patient, she was in there with nurse and Dr. Tamala Julian.    Final Clinical Impression(s) / ED Diagnoses Final diagnoses:  Chest pressure    Rx / DC Orders ED Discharge Orders    None       Dyann Ruddle 12/23/19 1844    Noemi Chapel, MD 12/24/19 [phone]

## 2019-12-23 NOTE — H&P (Addendum)
History and Physical    [name] YDX:[id number] DOB: 09-17-1931 DOA: 12/22/2019  Referring MD/NP/PA: Jac Canavan PCP: [name], DO  Consultants: Dr. C-cardiology Patient coming from: PCP office  Chief Complaint: Chest pain  I have personally briefly reviewed patient's old medical records in [place]   HPI: [name] is a 86 y.o. female with medical history significant of atrial fibrillation on Eliquis, CAD, HTN, HLD, AICD, CKD stage III, IBS, nephrolithiasis, arthritis, history of COVID-19 in 12/2018, and GERD with esophagitis who presented with complaints of left-sided chest pain.  Symptoms have been intermittent for weeks, but acutely worse over the last 4 days.  She describes it as a tightness in her chest like there is a weight sitting there.  Pain does not radiate anywhere.  She complains of getting sweaty when pain symptoms occur.  Notes associated symptoms of dyspnea with exertion, dizziness with changes in position, dysuria, upper abdominal pain, left flank pain, and diarrhea.  Patient notes that she has been having about 4 bowel movements per day on average for some time and thinks that she may be a little dehydrated.  Denies having any significant calf pain, leg swelling, she has been taking 1 or 2 nitroglycerin with some relief in pain, but notes that it does not always completely resolve after doing so.  Yesterday, patient had went to her PCP office originally for a right knee joint injection due to significant arthritis.  However was noted to have an abnormal EKG and sent to the hospital for further evaluation.  She had just recently been hospitalized after having a syncopal   ED Course: Upon admission into the emergency department patient was seen to be afebrile, pulse 50-62, blood pressures 97/72-175/75, and all other vital signs maintained.  Labs significant for hemoglobin 11.7, sodium 131, BUN 20, creatinine 1.63, and high-sensitivity  troponin negative x2.  Chest x-ray showed no acute abnormalities.  Patient has been given 325 mg of aspirin.  Cardiology formally consulted.  TR[facility] called to admit.  Review of Systems  Constitutional: Positive for diaphoresis and weight loss (40 pound weight loss since last year after having Covid). Negative for fever.  HENT: Negative for ear discharge and nosebleeds.   Eyes: Negative for photophobia and pain.  Respiratory: Positive for shortness of breath. Negative for cough.   Cardiovascular: Positive for chest pain. Negative for leg swelling.  Gastrointestinal: Positive for abdominal pain. Negative for nausea and vomiting.  Genitourinary: Positive for dysuria. Negative for flank pain.  Musculoskeletal: Positive for joint pain. Negative for falls.  Neurological: Negative for focal weakness and loss of consciousness.  Psychiatric/Behavioral: Negative for memory loss and substance abuse.    Past Medical History:  Diagnosis Date  . AICD (automatic cardioverter/defibrillator) present   . Allergy    SESONAL  . Anxiety   . ARTHRITIS   . Arthritis   . Atrial fibrillation ([place])   . CAD (coronary artery disease)    a. history of cardiac arrest 1993;  b. s/p LAD/LCX stenting in 2003;  c. 07/2011 Cath: LM nl, LAD patent stent, D1 80ost, LCX patent stent, RCA min irregs;  d. 04/2012 MV: EF 66%, no ishcemia;  e. 12/2013 Echo: Ef 55%, no rwma, Gr 1 DD, triv AI/MR, mildly dil LA;  f. 12/2013 Lexi MV: intermediate risk - apical ischemia and inf/infsept fixed defect, ? artifactual.  . CAD in native artery 12/31/2013   Previous stents to LAD and Ramus patent on cath today 12/31/13 also  There is severe disease in the ostial first diagonal which is unchanged from most recent cardiac catheterization. The right coronary artery could not be engaged selectively but nonselective angiography showed no significant disease in the proximal and midsegment.     . Cataract    DENIES  . Chronic back pain   . Chronic  sinus bradycardia   . CKD (chronic kidney disease), stage III ([place])   . Clotting disorder (HCC)    DVT  . COLITIS 12/02/2007   Qualifier: Diagnosis of  By: Nils Pyle CMA ([place]), [name]    . Diabetes mellitus without complication ([place])    DENIES  . Diverticulosis of colon (without mention of hemorrhage) 2007   Colonoscopy   . Esophageal stricture    a. 2012 s/p dil.  . Esophagitis, unspecified    a. 2012 EGD  . EXTERNAL HEMORRHOIDS   . GERD (gastroesophageal reflux disease)    omeprazole  . H/O hiatal hernia   . Hiatal hernia    a. 2012 EGD.  Marland Kitchen History of DVT in the past, not on Coumadin now    left  leg  . HYPERCHOLESTEROLEMIA   . Hypertension   . ICD (implantable cardiac defibrillator) in place    a. s/p initial ICD in 1993 in setting of cardiac arrest;  b. 01/2007 gen change: Guidant T135 Vitality DS VR single lead ICD.  . Macular degeneration    gets injecton in eye every 5 weeks- last injection - 05/03/2013   . Myocardial infarction ([place]) 1993  . Near syncope 05/22/2015  . Nephrolithiasis, just saw Dr [name]   . Orthostatic hypotension   . Peripheral vascular disease ([place])    ???  . RECTAL BLEEDING 12/03/2007   Qualifier: Diagnosis of  By: Sharlett Iles MD Byrd Hesselbach   . Unspecified gastritis and gastroduodenitis without mention of hemorrhage    a. 2003 EGD->not noted on 2012 EGD.  Marland Kitchen Unstable angina, neg MI, cath stable.maybe GI 12/30/2013    Past Surgical History:  Procedure Laterality Date  . BREAST BIOPSY Left 11/2013  . CARDIAC CATHETERIZATION    . CARDIAC CATHETERIZATION  01/01/15   patent stents -there is severe disease in ostial 1st diag but without change.   Marland Kitchen CARDIAC DEFIBRILLATOR PLACEMENT  02/05/2007   Guidant  . CARDIOVASCULAR STRESS TEST  12/30/2013   abnormal  . CARDIOVERSION N/A 02/13/2019   Procedure: CARDIOVERSION;  Surgeon: Sanda Klein, MD;  Location: [facility];  Service: Cardiovascular;  Laterality: N/A;  . CATARACT EXTRACTION Right     Dr. Katy Fitch  . COLONOSCOPY    . coronary stents     . CYSTOSCOPY WITH URETEROSCOPY Right 05/20/2013   Procedure: CYSTOSCOPY, RIGHT URETEROSCOPY STONE EXTRACTION, Insertion of right DOUBLE J STENT ;  Surgeon: Irine Seal, MD;  Location: [facility];  Service: Urology;  Laterality: Right;  . CYSTOSCOPY WITH URETEROSCOPY AND STENT PLACEMENT N/A 06/03/2013   Procedure: SECOND LOOK CYSTOSCOPY WITH URETEROSCOPY  HOLMIUM LASER LITHO AND STONE EXTRACTION Sammie Bench ;  Surgeon: Malka So, MD;  Location: [facility];  Service: Urology;  Laterality: N/A;  . ESOPHAGOGASTRODUODENOSCOPY (EGD) WITH PROPOFOL N/A 11/20/2019   Procedure: ESOPHAGOGASTRODUODENOSCOPY (EGD) WITH PROPOFOL;  Surgeon: Daneil Dolin, MD;  [facility];  Service: Endoscopy;  Laterality: N/A;  . GIVENS CAPSULE STUDY N/A 10/30/2016   Procedure: GIVENS CAPSULE STUDY;  Surgeon: Irene Shipper, MD;  Location: [facility];  Service: Endoscopy;  Laterality: N/A;  NEEDS TO BE ADMITTED FOR OBSERVATION PT. HAS DEFIB  .  HEMORROIDECTOMY  80's  . HOLMIUM LASER APPLICATION Right 04/17/7060   Procedure: HOLMIUM LASER APPLICATION;  Surgeon: Irine Seal, MD;  Location: [facility];  Service: Urology;  Laterality: Right;  . HYSTEROSCOPY WITH D & C  09/02/2010   Procedure: DILATATION AND CURETTAGE (D&C) /HYSTEROSCOPY;  Surgeon: Margarette Asal;  Location: [facility];  Service: Gynecology;  Laterality: N/A;  Dilation and Curettage with Hysteroscopy and Polypectomy  . IMPLANTABLE CARDIOVERTER DEFIBRILLATOR (ICD) GENERATOR CHANGE N/A 02/10/2014   Procedure: ICD GENERATOR CHANGE;  Surgeon: Sanda Klein, MD;  Location: [facility];  Service: Cardiovascular;  Laterality: N/A;  . LEFT HEART CATHETERIZATION WITH CORONARY ANGIOGRAM N/A 07/28/2011   Procedure: LEFT HEART CATHETERIZATION WITH CORONARY ANGIOGRAM;  Surgeon: Lorretta Harp, MD;  Location: [facility];  Service: Cardiovascular;  Laterality: N/A;  . LEFT HEART CATHETERIZATION WITH CORONARY ANGIOGRAM N/A 12/31/2013    Procedure: LEFT HEART CATHETERIZATION WITH CORONARY ANGIOGRAM;  Surgeon: Wellington Hampshire, MD;  Location: [facility];  Service: Cardiovascular;  Laterality: N/A;     reports that she has never smoked. She has never used smokeless tobacco. She reports that she does not drink alcohol and does not use drugs.  Allergies  Allergen Reactions  . Sotalol Other (See Comments)    Torsades   . Bactrim [Sulfamethoxazole-Trimethoprim]     Itchy rash  . Ciprofloxacin Nausea Only  . Actos [Pioglitazone] Swelling  . Adhesive [Tape] Other (See Comments)    Causes sores  . Atorvastatin Other (See Comments)    myalgia myalgia myalgia  . Contrast Media [Iodinated Diagnostic Agents] Other (See Comments)    Headache (no action/pre-med required)  . Latex Rash  . Pedi-Pre Tape Spray [Wound Dressing Adhesive] Other (See Comments)    Causes sores  . Sulfa Antibiotics Other (See Comments)    Causes sores    Family History  Problem Relation Age of Onset  . Stroke Mother   . Other Mother        brain tumor  . Other Father        MI  . Heart attack Father   . Stroke Sister   . Macular degeneration Sister   . Diabetes Daughter   . Cancer Daughter        ovarian  . Colon polyps Daughter   . Atrial fibrillation Sister   . Hyperlipidemia Sister   . Osteoporosis Sister   . Stroke Sister   . Uterine cancer Sister   . Heart attack Brother   . Heart disease Brother   . Asthma Brother   . Breast cancer Neg Hx     Prior to Admission medications   Medication Sig Start Date End Date Taking? Authorizing Provider  amiodarone (PACERONE) 200 MG tablet TAKE 1 TABLET DAILY 11/24/19   Fenton, Clint R, PA  amitriptyline (ELAVIL) 10 MG tablet Take 1 tablet (10 mg total) by mouth at bedtime. 11/22/19   Orson Eva, MD  apixaban (ELIQUIS) 5 MG TABS tablet Take 1 tablet (5 mg total) by mouth 2 (two) times daily. 10/07/19   Croitoru, Mihai, MD  carboxymethylcellulose (REFRESH PLUS) 0.5 % SOLN Place 1 drop into both  eyes 3 (three) times daily as needed (dry/irritated eyes.).    [provider]  Cholecalciferol (VITAMIN D) 50 MCG (2000 UT) CAPS Take 1 capsule by mouth daily.    [provider]  diphenoxylate-atropine (LOMOTIL) 2.5-0.025 MG tablet TAKE 1 TABLET EVERY 8 HOURS AS NEEDED FOR DIARRHEA Patient taking differently: Take 1 tablet by  mouth every 8 (eight) hours as needed for diarrhea or loose stools.  03/25/19   [name], DO  hydrALAZINE (APRESOLINE) 10 MG tablet TAKE 1 TABLET EVERY 8 HOURS Patient taking differently: Take 10 mg by mouth every 8 (eight) hours.  10/09/19   Croitoru, Mihai, MD  hydrocortisone (ANUSOL-HC) 25 MG suppository Place 1 suppository (25 mg total) rectally 2 (two) times daily. 11/22/19   Orson Eva, MD  isosorbide mononitrate (IMDUR) 30 MG 24 hr tablet TAKE ONE HALF (1/2) TABLET DAILY Patient taking differently: Take 15 mg by mouth daily.  09/23/19   Croitoru, Dani Gobble, MD  levothyroxine (SYNTHROID) 75 MCG tablet TAKE 1 TABLET DAILY BEFORE BREAKFAST 12/17/19   Ronnie Doss M, DO  linaclotide [facility]) 145 MCG CAPS capsule Take 1 capsule (145 mcg total) by mouth daily before breakfast. 11/26/19   Ronnie Doss M, DO  loratadine (CLARITIN) 10 MG tablet Take 1 tablet (10 mg total) by mouth daily. (for itching) Patient taking differently: Take 10 mg by mouth as needed for allergies or itching.  01/20/19   [name], DO  meclizine (ANTIVERT) 25 MG tablet Take 25 mg by mouth 3 (three) times daily as needed for dizziness or nausea.  07/22/19   [provider]  metoprolol tartrate (LOPRESSOR) 25 MG tablet TAKE  (1)  TABLET TWICE A DAY. 12/16/19   Croitoru, Mihai, MD  Multiple Vitamins-Minerals (CENTRUM SILVER ULTRA WOMENS PO) Take 1 tablet by mouth daily.    [provider]  nitroGLYCERIN (NITROSTAT) 0.4 MG SL tablet Place 1 tablet (0.4 mg total) under the tongue every 5 (five) minutes as needed for chest pain. 12/20/18   Claretta Fraise, MD   pantoprazole (PROTONIX) 40 MG tablet Take 1 tablet (40 mg total) by mouth every evening. Patient taking differently: Take 40 mg by mouth daily.  02/21/19   Ronnie Doss M, DO  PARoxetine (PAXIL) 20 MG tablet TAKE (1) TABLET DAILY IN THE MORNING. 12/15/19   Ronnie Doss M, DO  rosuvastatin (CRESTOR) 40 MG tablet TAKE (1/2) TABLET DAILY. Patient taking differently: Take 20 mg by mouth daily.  10/06/19   [name], DO  torsemide (DEMADEX) 20 MG tablet Take 1 tablet (20 mg total) by mouth daily as needed (edema). 04/01/19   [name], DO  traMADol (ULTRAM) 50 MG tablet Take 0.5 tablets (25 mg total) by mouth every 12 (twelve) hours as needed for moderate pain or severe pain. 11/27/19   Carlis Stable, NP    Physical Exam:  Constitutional: Elderly female NAD, calm, comfortable Vitals:   12/22/19 2154 12/23/19 0208 12/23/19 0501 12/23/19 0821  BP: 106/65 97/72 118/65 (!) 158/81  Pulse: (!) 50 (!) 51 (!) 50 (!) 52  Resp: 18 18 17 17   Temp: 98.1 F (36.7 C) 98 F (36.7 C) 98 F (36.7 C)   TempSrc:      SpO2: 100% 98% 100% 98%   Eyes: PERRL, lids and conjunctivae normal ENMT: Mucous membranes are moist. Posterior pharynx clear of any exudate or lesions. Neck: normal, supple, no masses, no thyromegaly Respiratory: clear to auscultation bilaterally, no wheezing, no crackles. Normal respiratory effort. No accessory muscle use.  Cardiovascular: Regular rate and rhythm, no murmurs / rubs / gallops. No extremity edema. 2+ pedal pulses. No carotid bruits.  Abdomen: no tenderness, no masses palpated. No hepatosplenomegaly. Bowel sounds positive.  Musculoskeletal: no clubbing / cyanosis. No joint deformity upper and lower extremities. Good ROM, no contractures. Normal muscle tone.  Skin: Abrasion  to the right shin Neurologic: CN 2-12 grossly intact. Sensation intact, DTR normal. Strength 5/5 in all 4.  Psychiatric: Normal judgment and insight. Alert and oriented x 3. Normal  mood.     Labs on Admission: I have personally reviewed following labs and imaging studies  CBC: Recent Labs  Lab 12/22/19 1316  WBC 9.1  HGB 11.7*  HCT 34.5*  MCV 95.0  PLT 785   Basic Metabolic Panel: Recent Labs  Lab 12/22/19 1316  NA 131*  K 4.2  CL 99  CO2 23  GLUCOSE 142*  BUN 20  CREATININE 1.63*  CALCIUM 9.3   GFR: Estimated Creatinine Clearance: 21.4 mL/min (A) (by C-G formula based on SCr of 1.63 mg/dL (H)). Liver Function Tests: No results for input(s): AST, ALT, ALKPHOS, BILITOT, PROT, ALBUMIN in the last 168 hours. No results for input(s): LIPASE, AMYLASE in the last 168 hours. No results for input(s): AMMONIA in the last 168 hours. Coagulation Profile: No results for input(s): INR, PROTIME in the last 168 hours. Cardiac Enzymes: No results for input(s): CKTOTAL, CKMB, CKMBINDEX, TROPONINI in the last 168 hours. BNP (last 3 results) No results for input(s): PROBNP in the last 8760 hours. HbA1C: No results for input(s): HGBA1C in the last 72 hours. CBG: No results for input(s): GLUCAP in the last 168 hours. Lipid Profile: No results for input(s): CHOL, HDL, LDLCALC, TRIG, CHOLHDL, LDLDIRECT in the last 72 hours. Thyroid Function Tests: No results for input(s): TSH, T4TOTAL, FREET4, T3FREE, THYROIDAB in the last 72 hours. Anemia Panel: No results for input(s): VITAMINB12, FOLATE, FERRITIN, TIBC, IRON, RETICCTPCT in the last 72 hours. Urine analysis:    Component Value Date/Time   COLORURINE STRAW (A) 11/14/2019 2145   APPEARANCEUR Clear 11/26/2019 1301   LABSPEC 1.003 (L) 11/14/2019 2145   PHURINE 8.0 11/14/2019 2145   GLUCOSEU Negative 11/26/2019 1301   HGBUR MODERATE (A) 11/14/2019 2145   BILIRUBINUR Negative 11/26/2019 1301   KETONESUR NEGATIVE 11/14/2019 2145   PROTEINUR Negative 11/26/2019 1301   PROTEINUR NEGATIVE 11/14/2019 2145   UROBILINOGEN negative 03/15/2015 1352   UROBILINOGEN 0.2 07/25/2011 2213   NITRITE Negative 11/26/2019  1301   NITRITE NEGATIVE 11/14/2019 2145   LEUKOCYTESUR 1+ (A) 11/26/2019 1301   LEUKOCYTESUR NEGATIVE 11/14/2019 2145   Sepsis Labs: No results found for this or any previous visit (from the past 240 hour(s)).   Radiological Exams on Admission: DG Chest 2 View  Result Date: 12/22/2019 CLINICAL DATA:  Central chest pain for 2 days. EXAM: CHEST - 2 VIEW COMPARISON:  11/14/2019 FINDINGS: An ICD remains in place. The cardiac silhouette remains mildly enlarged. Aortic atherosclerosis is noted. There is mild chronic coarsening of the interstitial markings. No confluent airspace opacity, overt pulmonary edema, sizable pleural effusion, or pneumothorax is identified. The bones appear diffusely osteopenic, and there is a chronic lower thoracic compression fracture. IMPRESSION: No active cardiopulmonary disease. Electronically Signed   By: Logan Bores M.D.   On: 12/22/2019 14:44    EKG: Independently reviewed.  Sinus bradycardia at 50 bpm with QTc 514  Assessment/Plan Chest pain: On admission patient presents with complaints of left-sided chest pressure that does not radiate.  High-sensitivity troponin were negative x2.  -Admit to a telemetry bed -Check lipid panel -Nitroglycerin as needed for chest pain -Continue Imdur, beta-blocker, and statin -Appreciate cardiology consultative services, will follow up for further recommendation  Urinary tract infection with hematuria: Acute.  Patient reported having some dysuria with urination.  She has a history of frequent  UTIs.  Previous cultures have been positive for E. coli and Enterococcus faecalis. -Check urine culture -Rocephin IV and adjust antibiotics as needed based off cultures  Acute kidney injury superimposed on chronic kidney disease stage 3: On admission creatinine elevated up to 1.63.  Patient reports that she has been having diarrhea which could likely be the cause for the increase from a baseline of around 1.3. -Normal saline IV fluids 75  mL/h x 1 L -Held torsemide -Recheck kidney function in a.m.   IBS with diarrhea -Monitor intake and output -IV fluids as seen on  Paroxysmal atrial fibrillation on chronic anticoagulation:CHA2DS2-VASc score =6.  Patient currently rate controlled -Continue amiodarone and Eliquis adjusted to 2.5 twice daily for age and renal function -Goal potassium 4 and magnesium 2  S/p AICD: Patient device was interrogated in the ED and not found to have any episodes with heart rates greater than 200.  Hyponatremia: Acute on chronic.  On admission sodium level noted to be 131 on admission. -IV fluids as noted above -Recheck sodium levels in a.m.  Essential hypertension: On admission blood pressures were initially noted to be elevated.  Home medications include metoprolol 25 mg twice daily, hydralazine 10 mg every 8 hours, Imdur 15 mg daily, and torosemide 20 mg daily as needed for edema -Continue hydralazine, metoprolol, and Imdur  Prolonged QT interval: On admission QTc noted to be initially 514.  Patient is on multiple medications which prolong her QT C. -Limiting any QT prolonging medication -Recheck EKG in a.m. -Correct  any electrolyte abnormalities  Anxiety  -Continue Paxil -Held Elavil due to prolonged QT  Nephrolithiasis: Patient did complain of left sided flank pain on Saturday night.  Patient had recent renal ultrasound on 12/3 that showed large bilateral renal cortical atrophy with bilateral nonobstructing nephrolithiasis.  Hyperlipidemia -Continue Crestor  History of DVT -Continue to hold  GERD -PPI held due to prolonged QT interval  DVT prophylaxis: Eliquis Code Status: Full Family Communication: None Disposition Plan: Possibly discharge home in 1 to 2 days Consults called: Cardiology Admission status: Inpatient requiring more than 2 midnight stay for AKI with UTI  Norval Morton MD [facility] Pager [phone]   If 7PM-7AM, please contact  night-coverage [email or web address] Password [email or web address]  12/23/2019, 9:14 AM

## 2019-12-23 NOTE — Consult Note (Addendum)
Cardiology Consultation:   Patient ID: [name] MRN: [id number]; DOB: [birth year]  Admit date: 12/22/2019 Date of Consult: 12/23/2019  Primary Care Provider: [name], DO [facility] Cardiologist: Renee Klein, MD  [facility] Electrophysiologist:  None    Patient Profile:   [name] is a 86 y.o. female with a hx of CAD, catecholaminergic polymorphic VT s/p ICD, PAF on eliquis, HLD, DM, CKD stage III who is being seen today for the evaluation of chest pain at the request of Dr. Tamala [name].  History of Present Illness:   [name] has a history of CAD s/p PCI to LAD and LCx in 2003. She had patent stents noted on angiography in 2006, 2013, and 2015. She has a history of chronic systolic heart failure and catecholaminergic polymorphic VT s/p single-chamber ICD (1993, new V lead 2009, gen chang 2016, [employer]). She has a history of paroxysmal atrial fibrillation and has recently followed in Afib clinic.   In the past, she had to be weaned off disopyramide for catecholaminergic polymorphic VT, did not tolerate sotalol (developed torsades) and had diarrhea with quinidine.   She was last seen 10/09/19 with [name]. She is symptomatic when she has Afib - including shortness of breath, chest pain, and palpitations, and eventually lower extremity edema of in Afib for a longer period of time. She had a successful DCCV Jan 2021 and was started on amiodarone in Feb 2021. Unfortunately, she developed hypothyroidism (TSH 56) in July 2021, suspected contribution from amiodarone. At last visit, she was in sinus rhythm with first degree AV block and LBBB.   She has been maintained on 200 mg amiodarone, 30 mg imdur, lopressor 25 mg BID.   She was recently hospitalized Nov 2021 for abdominal pain. Echo at that time showed normal EF, garde 1 DD, biatrial enlargement, and mild to moderate TR. She was discharged with medical management for suspected IBS.   She  presented to [facility] 12/23/19 with left sided chest pain.  HS troponin x 2 negative EKG is paced paced rhythm CXR negative for acute processes  Cardiology was consulted. On my interview, she states she started having chest pain and heart palpitations on Friday. CP and palpitations persisted through the weekend. She presented to ortho for a cortisone injection in her knee and reported chest pain. They obtained an EKG and found irregular heart beat and sent her to the ER. She is no longer having chest pain.   Device was interrogated and did not show any ventricular rates over 200 and no firing.    Past Medical History:  Diagnosis Date  . AICD (automatic cardioverter/defibrillator) present   . Allergy    SESONAL  . Anxiety   . ARTHRITIS   . Arthritis   . Atrial fibrillation ([name])   . CAD (coronary artery disease)    a. history of cardiac arrest 1993;  b. s/p LAD/LCX stenting in 2003;  c. 07/2011 Cath: LM nl, LAD patent stent, D1 80ost, LCX patent stent, RCA min irregs;  d. 04/2012 MV: EF 66%, no ishcemia;  e. 12/2013 Echo: Ef 55%, no rwma, Gr 1 DD, triv AI/MR, mildly dil LA;  f. 12/2013 Lexi MV: intermediate risk - apical ischemia and inf/infsept fixed defect, ? artifactual.  . CAD in native artery 12/31/2013   Previous stents to LAD and Ramus patent on cath today 12/31/13 also There is severe disease in the ostial first diagonal which is unchanged from most recent cardiac catheterization. The right coronary  artery could not be engaged selectively but nonselective angiography showed no significant disease in the proximal and midsegment.     . Cataract    DENIES  . Chronic back pain   . Chronic sinus bradycardia   . CKD (chronic kidney disease), stage III ([place])   . Clotting disorder (HCC)    DVT  . COLITIS 12/02/2007   Qualifier: Diagnosis of  By: [name] CMA ([place]), [name]    . Diabetes mellitus without complication ([place])    DENIES  . Diverticulosis of colon (without mention of hemorrhage)  2007   Colonoscopy   . Esophageal stricture    a. 2012 s/p dil.  . Esophagitis, unspecified    a. 2012 EGD  . EXTERNAL HEMORRHOIDS   . GERD (gastroesophageal reflux disease)    omeprazole  . H/O hiatal hernia   . Hiatal hernia    a. 2012 EGD.  Marland Kitchen History of DVT in the past, not on Coumadin now    left  leg  . HYPERCHOLESTEROLEMIA   . Hypertension   . ICD (implantable cardiac defibrillator) in place    a. s/p initial ICD in 1993 in setting of cardiac arrest;  b. 01/2007 gen change: Guidant T135 Vitality DS VR single lead ICD.  . Macular degeneration    gets injecton in eye every 5 weeks- last injection - 05/03/2013   . Myocardial infarction ([name]) 1993  . Near syncope 05/22/2015  . Nephrolithiasis, just saw Dr [name]- "OK"   . Orthostatic hypotension   . Peripheral vascular disease ([place])    ???  . RECTAL BLEEDING 12/03/2007   Qualifier: Diagnosis of  By: Renee Iles MD [name]   . Unspecified gastritis and gastroduodenitis without mention of hemorrhage    a. 2003 EGD->not noted on 2012 EGD.  Marland Kitchen Unstable angina, neg MI, cath stable.maybe GI 12/30/2013    Past Surgical History:  Procedure Laterality Date  . BREAST BIOPSY Left 11/2013  . CARDIAC CATHETERIZATION    . CARDIAC CATHETERIZATION  01/01/15   patent stents -there is severe disease in ostial 1st diag but without change.   Marland Kitchen CARDIAC DEFIBRILLATOR PLACEMENT  02/05/2007   Guidant  . CARDIOVASCULAR STRESS TEST  12/30/2013   abnormal  . CARDIOVERSION N/A 02/13/2019   Procedure: CARDIOVERSION;  Surgeon: Renee Klein, MD;  Location: [place];  Service: Cardiovascular;  Laterality: N/A;  . CATARACT EXTRACTION Right    Dr. Katy [name]  . COLONOSCOPY    . coronary stents     . CYSTOSCOPY WITH URETEROSCOPY Right 05/20/2013   Procedure: CYSTOSCOPY, RIGHT URETEROSCOPY STONE EXTRACTION, Insertion of right DOUBLE J STENT ;  Surgeon: Renee Seal, MD;  Location: [facility];  Service: Urology;  Laterality: Right;  . CYSTOSCOPY WITH  URETEROSCOPY AND STENT PLACEMENT N/A 06/03/2013   Procedure: SECOND LOOK CYSTOSCOPY WITH URETEROSCOPY  HOLMIUM LASER LITHO AND STONE EXTRACTION Sammie Bench ;  Surgeon: Malka So, MD;  Location: [facility];  Service: Urology;  Laterality: N/A;  . ESOPHAGOGASTRODUODENOSCOPY (EGD) WITH PROPOFOL N/A 11/20/2019   Procedure: ESOPHAGOGASTRODUODENOSCOPY (EGD) WITH PROPOFOL;  Surgeon: Daneil Dolin, MD;  [facility];  Service: Endoscopy;  Laterality: N/A;  . GIVENS CAPSULE STUDY N/A 10/30/2016   Procedure: GIVENS CAPSULE STUDY;  Surgeon: Irene Shipper, MD;  Location: [place];  Service: Endoscopy;  Laterality: N/A;  NEEDS TO BE ADMITTED FOR OBSERVATION PT. HAS DEFIB  . HEMORROIDECTOMY  80's  . HOLMIUM LASER APPLICATION Right 04/24/7024   Procedure: HOLMIUM LASER APPLICATION;  Surgeon:  Renee Seal, MD;  Location: [facility];  Service: Urology;  Laterality: Right;  . HYSTEROSCOPY WITH D & C  09/02/2010   Procedure: DILATATION AND CURETTAGE (D&C) /HYSTEROSCOPY;  Surgeon: Margarette Asal;  Location: [place] [facility];  Service: Gynecology;  Laterality: N/A;  Dilation and Curettage with Hysteroscopy and Polypectomy  . IMPLANTABLE CARDIOVERTER DEFIBRILLATOR (ICD) GENERATOR CHANGE N/A 02/10/2014   Procedure: ICD GENERATOR CHANGE;  Surgeon: Renee Klein, MD;  Location: [place] CATH LAB;  Service: Cardiovascular;  Laterality: N/A;  . LEFT HEART CATHETERIZATION WITH CORONARY ANGIOGRAM N/A 07/28/2011   Procedure: LEFT HEART CATHETERIZATION WITH CORONARY ANGIOGRAM;  Surgeon: Lorretta Harp, MD;  Location: [facility] C[facility] LAB;  Service: Cardiovascular;  Laterality: N/A;  . LEFT HEART CATHETERIZATION WITH CORONARY ANGIOGRAM N/A 12/31/2013   Procedure: LEFT HEART CATHETERIZATION WITH CORONARY ANGIOGRAM;  Surgeon: Wellington Hampshire, MD;  Location: [place] CATH LAB;  Service: Cardiovascular;  Laterality: N/A;     Home Medications:  Prior to Admission medications   Medication Sig Start Date End Date Taking? Authorizing Provider  amiodarone  (PACERONE) 200 MG tablet TAKE 1 TABLET DAILY Patient taking differently: Take 200 mg by mouth daily.  11/24/19  Yes Fenton, Clint R, PA  amitriptyline (ELAVIL) 10 MG tablet Take 1 tablet (10 mg total) by mouth at bedtime. 11/22/19  Yes Tat, Shanon Brow, MD  apixaban (ELIQUIS) 5 MG TABS tablet Take 1 tablet (5 mg total) by mouth 2 (two) times daily. 10/07/19  Yes Croitoru, Mihai, MD  carboxymethylcellulose (REFRESH PLUS) 0.5 % SOLN Place 1 drop into both eyes 3 (three) times daily as needed (dry/irritated eyes.).   Yes [provider]  Cholecalciferol (VITAMIN D) 50 MCG (2000 UT) CAPS Take 2,000 Units by mouth daily.    Yes [provider]  diphenoxylate-atropine (LOMOTIL) 2.5-0.025 MG tablet TAKE 1 TABLET EVERY 8 HOURS AS NEEDED FOR DIARRHEA Patient taking differently: Take 1 tablet by mouth every 8 (eight) hours as needed for diarrhea or loose stools.  03/25/19  Yes Gottschalk, Leatrice Jewels M, DO  hydrALAZINE (APRESOLINE) 10 MG tablet TAKE 1 TABLET EVERY 8 HOURS Patient taking differently: Take 10 mg by mouth every 8 (eight) hours.  10/09/19  Yes Croitoru, Mihai, MD  isosorbide mononitrate (IMDUR) 30 MG 24 hr tablet TAKE ONE HALF (1/2) TABLET DAILY Patient taking differently: Take 15 mg by mouth daily.  09/23/19  Yes Croitoru, Mihai, MD  levothyroxine (SYNTHROID) 75 MCG tablet TAKE 1 TABLET DAILY BEFORE BREAKFAST Patient taking differently: Take 75 mcg by mouth daily before breakfast.  12/17/19  Yes Gottschalk, Ashly M, DO  loratadine (CLARITIN) 10 MG tablet Take 1 tablet (10 mg total) by mouth daily. (for itching) Patient taking differently: Take 10 mg by mouth daily as needed for allergies or itching.  01/20/19  Yes Gottschalk, Leatrice Jewels M, DO  meclizine (ANTIVERT) 25 MG tablet Take 25 mg by mouth 3 (three) times daily as needed for dizziness or nausea.  07/22/19  Yes [provider]  metoprolol tartrate (LOPRESSOR) 25 MG tablet TAKE  (1)  TABLET TWICE A DAY. Patient taking differently: Take 25 mg  by mouth 2 (two) times daily.  12/16/19  Yes Croitoru, Mihai, MD  Multiple Vitamins-Minerals (CENTRUM SILVER ULTRA WOMENS PO) Take 1 tablet by mouth daily.   Yes [provider]  nitroGLYCERIN (NITROSTAT) 0.4 MG SL tablet Place 1 tablet (0.4 mg total) under the tongue every 5 (five) minutes as needed for chest pain. Patient taking differently: Place 0.4 mg under the tongue every 5 (five) minutes  as needed for chest pain (max 3 doses).  12/20/18  Yes Claretta Fraise, MD  pantoprazole (PROTONIX) 40 MG tablet Take 1 tablet (40 mg total) by mouth every evening. Patient taking differently: Take 40 mg by mouth daily.  02/21/19  Yes Gottschalk, Ashly M, DO  PARoxetine (PAXIL) 20 MG tablet TAKE (1) TABLET DAILY IN THE MORNING. Patient taking differently: Take 20 mg by mouth daily.  12/15/19  Yes Gottschalk, Ashly M, DO  rosuvastatin (CRESTOR) 40 MG tablet TAKE (1/2) TABLET DAILY. Patient taking differently: Take 20 mg by mouth daily.  10/06/19  Yes Gottschalk, Leatrice Jewels M, DO  torsemide (DEMADEX) 20 MG tablet Take 1 tablet (20 mg total) by mouth daily as needed (edema). 04/01/19  Yes Gottschalk, Leatrice Jewels M, DO  hydrocortisone (ANUSOL-HC) 25 MG suppository Place 1 suppository (25 mg total) rectally 2 (two) times daily. Patient not taking: Reported on 12/23/2019 11/22/19   Orson Eva, MD  linaclotide [facility]) 145 MCG CAPS capsule Take 1 capsule (145 mcg total) by mouth daily before breakfast. Patient not taking: Reported on 12/23/2019 11/26/19   Renee Norlander, DO  traMADol (ULTRAM) 50 MG tablet Take 0.5 tablets (25 mg total) by mouth every 12 (twelve) hours as needed for moderate pain or severe pain. Patient not taking: Reported on 12/23/2019 11/27/19   Carlis Stable, NP    Inpatient Medications: Scheduled Meds: . amiodarone  200 mg Oral Daily  . apixaban  2.5 mg Oral BID  . [START ON 12/24/2019] aspirin EC  81 mg Oral Daily  . hydrALAZINE  10 mg Oral Q8H  . isosorbide mononitrate  15 mg Oral Daily  .  [START ON 12/24/2019] levothyroxine  75 mcg Oral QAC breakfast  . metoprolol tartrate  25 mg Oral BID  . PARoxetine  20 mg Oral Daily  . rosuvastatin  20 mg Oral Daily   Continuous Infusions: . cefTRIAXone (ROCEPHIN)  IV Stopped (12/23/19 1259)   PRN Meds: acetaminophen, diphenoxylate-atropine, loratadine, meclizine, nitroGLYCERIN, polyvinyl alcohol  Allergies:    Allergies  Allergen Reactions  . Sotalol Other (See Comments)    Torsades   . Bactrim [Sulfamethoxazole-Trimethoprim]     Itchy rash  . Ciprofloxacin Nausea Only  . Tramadol Other (See Comments)    Hallucination, bad-dream  . Actos [Pioglitazone] Swelling  . Adhesive [Tape] Other (See Comments)    Causes sores  . Atorvastatin Other (See Comments)    myalgia  . Contrast Media [Iodinated Diagnostic Agents] Other (See Comments)    Headache (no action/pre-med required)  . Latex Rash  . Pedi-Pre Tape Spray [Wound Dressing Adhesive] Other (See Comments)    Causes sores  . Sulfa Antibiotics Other (See Comments)    Causes sores    Social History:   Social History   Socioeconomic History  . Marital status: Widowed    Spouse name: Not on file  . Number of children: 4  . Years of education: 8  . Highest education level: 8th grade  Occupational History  . Occupation: Retired  Tobacco Use  . Smoking status: Never Smoker  . Smokeless tobacco: Never Used  Vaping Use  . Vaping Use: Never used  Substance and Sexual Activity  . Alcohol use: No  . Drug use: No  . Sexual activity: Never    Birth control/protection: Post-menopausal  Other Topics Concern  . Not on file  Social History Narrative  . Not on file   Social Determinants of Health   Financial Resource Strain: Low Risk   . Difficulty  of Paying Living Expenses: Not hard at all  Food Insecurity: No Food Insecurity  . Worried About Charity fundraiser in the Last Year: Never true  . Ran Out of Food in the Last Year: Never true  Transportation Needs: No  Transportation Needs  . Lack of Transportation (Medical): No  . Lack of Transportation (Non-Medical): No  Physical Activity: Inactive  . Days of Exercise per Week: 0 days  . Minutes of Exercise per Session: 0 min  Stress: No Stress Concern Present  . Feeling of Stress : Only a little  Social Connections: Moderately Integrated  . Frequency of Communication with Friends and Family: More than three times a week  . Frequency of Social Gatherings with Friends and Family: Once a week  . Attends Religious Services: More than 4 times per year  . Active Member of Clubs or Organizations: Yes  . Attends Archivist Meetings: More than 4 times per year  . Marital Status: Widowed  Intimate Partner Violence: Not At Risk  . Fear of Current or Ex-Partner: No  . Emotionally Abused: No  . Physically Abused: No  . Sexually Abused: No    Family History:    Family History  Problem Relation Age of Onset  . Stroke Mother   . Other Mother        brain tumor  . Other Father        MI  . Heart attack Father   . Stroke Sister   . Macular degeneration Sister   . Diabetes Daughter   . Cancer Daughter        ovarian  . Colon polyps Daughter   . Atrial fibrillation Sister   . Hyperlipidemia Sister   . Osteoporosis Sister   . Stroke Sister   . Uterine cancer Sister   . Heart attack Brother   . Heart disease Brother   . Asthma Brother   . Breast cancer Neg Hx      ROS:  Please see the history of present illness.   All other ROS reviewed and negative.     Physical Exam/Data:   Vitals:   12/23/19 0930 12/23/19 1230 12/23/19 1445 12/23/19 1501  BP: (!) 175/75 (!) 171/84 (!) 184/78 (!) 175/101  Pulse: (!) 57 65 (!) 59   Resp: 18 (!) 21 20   Temp:      TempSrc:      SpO2: 98% 93% 98%    No intake or output data in the 24 hours ending 12/23/19 1535 Last 3 Weights 12/22/2019 11/27/2019 11/26/2019  Weight (lbs) 136 lb 3.2 oz 137 lb 12.8 oz 138 lb  Weight (kg) 61.78 kg 62.506 kg  62.596 kg     There is no height or weight on file to calculate BMI.  General:  Elderly female in NAD HEENT: normal Neck: no JVD Vascular: No carotid bruits Cardiac:  normal S1, S2; RRR; no murmur  Lungs:  clear to auscultation bilaterally, no wheezing, rhonchi or rales  Abd: soft, nontender, no hepatomegaly  Ext: no edema Musculoskeletal:  No deformities, BUE and BLE strength normal and equal Skin: warm and dry  Neuro:  CNs 2-12 intact, no focal abnormalities noted Psych:  Normal affect   EKG:  The EKG was personally reviewed and demonstrates:  Paced rhythm Telemetry:  Telemetry was personally reviewed and demonstrates:  Paced, appears sinus rhythm  Relevant CV Studies:  Echo 11/20/19: 1. Left ventricular ejection fraction, by estimation, is 50 to 55%. The  left  ventricle has normal function. The left ventricle has no regional  wall motion abnormalities. There is mild left ventricular hypertrophy.  Left ventricular diastolic parameters  are consistent with Grade I diastolic dysfunction (impaired relaxation).  2. Right ventricular systolic function is normal. The right ventricular  size is normal. There is mildly elevated pulmonary artery systolic  pressure.  3. Left atrial size was severely dilated.  4. Right atrial size was mildly dilated.  5. The mitral valve is normal in structure. Trivial mitral valve  regurgitation. No evidence of mitral stenosis.  6. Tricuspid valve regurgitation is mild to moderate.  7. The aortic valve is tricuspid. Aortic valve regurgitation is mild.  8. The inferior vena cava is normal in size with greater than 50%  respiratory variability, suggesting right atrial pressure of 3 mmHg.   Laboratory Data:  High Sensitivity Troponin:   Recent Labs  Lab 12/22/19 1316 12/22/19 2112  TROPONINIHS 8 8     Chemistry Recent Labs  Lab 12/22/19 1316  NA 131*  K 4.2  CL 99  CO2 23  GLUCOSE 142*  BUN 20  CREATININE 1.63*  CALCIUM 9.3   GFRNONAA 31*  ANIONGAP 9    No results for input(s): PROT, ALBUMIN, AST, ALT, ALKPHOS, BILITOT in the last 168 hours. Hematology Recent Labs  Lab 12/22/19 1316  WBC 9.1  RBC 3.63*  HGB 11.7*  HCT 34.5*  MCV 95.0  MCH 32.2  MCHC 33.9  RDW 14.0  PLT 286   BNPNo results for input(s): BNP, PROBNP in the last 168 hours.  DDimer No results for input(s): DDIMER in the last 168 hours.   Radiology/Studies:  DG Chest 2 View  Result Date: 12/22/2019 CLINICAL DATA:  Central chest pain for 2 days. EXAM: CHEST - 2 VIEW COMPARISON:  11/14/2019 FINDINGS: An ICD remains in place. The cardiac silhouette remains mildly enlarged. Aortic atherosclerosis is noted. There is mild chronic coarsening of the interstitial markings. No confluent airspace opacity, overt pulmonary edema, sizable pleural effusion, or pneumothorax is identified. The bones appear diffusely osteopenic, and there is a chronic lower thoracic compression fracture. IMPRESSION: No active cardiopulmonary disease. Electronically Signed   By: Logan Bores M.D.   On: 12/22/2019 14:44     Assessment and Plan:   Chest pain Known CAD - troponin x 2 negative - EKG with paced rhythm - she reports chest discomfort and palpitations that are consistent with when she is in Afib - she is chest pain free currently - suspect chest discomfort and palpitations are likely related to Afib over the weekend - monitor overnight, do not anticipate she will need an ischemic evaluation - recent heart cath last month - will not repeat a heart cath at this time - continue BB, imdur, and hydralazine - hold off on ASA given eliquis   Atrial fibrillation VT Single lead ICD in place - device interrogated - single lead  - no episodes of ventricular rate over 200, no firing - continue amiodarone and eliquis - should be on 2.5 mg BID for age and renal function   Hypertension Orthostatic hypotension - continue home hydralazine, imdur, BB   DM - per  primary   CKD stage IIIb - follow renal function - on reduced dose of eliquis   Hyperlipidemia - continue statin    Confirm that the CHADS2-VASc score is correct.   If not, click here to update score.   Then [facility] your note. :[id number]} CHA2DS2-VASc Score = 6  This indicates a 9.7%  annual risk of stroke. The patient's score is based upon: CHF History: 0 HTN History: 1 Diabetes History: 1 Stroke History: 0 Vascular Disease History: 1 Age Score: 2 Gender Score: 1         For questions or updates, please contact [facility] Please consult [email or web address] for contact info under    Signed, Ledora Bottcher, PA  12/23/2019 3:35 PM   I have personally seen and examined this patient. I agree with the assessment and plan as outlined above.  86 yo female with history of CAD with prior PCI/stenting, chronic systolic CHF, VT s/p ICD, PAF who is presenting to the ED with c/o chest pain. She is followed in our office by [name]. The pain is left sided without radiation and associated with palpitations. Troponin negative. Chest x-ray without any acute changes. EKG is personally reviewed by me and is sinus with V pacing.   She has no chest pain currently. No exertional chest pain at home.   My exam:  General: Well developed, well nourished, NAD  HEENT: OP clear, mucus membranes moist  SKIN: warm, dry. No rashes.  Neuro: No focal deficits  Musculoskeletal: Muscle strength 5/5 all ext  Psychiatric: Mood and affect normal  Neck: No JVD, no carotid bruits, no thyromegaly, no lymphadenopathy.  Lungs:Clear bilaterally, no wheezes, rhonci, crackles  Cardiovascular: Regular rate and rhythm. No murmurs, gallops or rubs.  Abdomen:Soft. Bowel sounds present. Non-tender.  Extremities: No lower extremity edema. Pulses are 2 + in the bilateral DP/PT.   Plan: Chest pain: Her pain is likely related to atrial fib over the weekend which is her usual cause. No evidence of ACS. We will  not pursue an ischemic evaluation. Agree with monitoring overnight on telemetry.   Lauree Chandler  12/23/2019  3:46 PM

## 2019-12-23 NOTE — ED Notes (Signed)
ED Provider at bedside. 

## 2019-12-24 DIAGNOSIS — R072 Precordial pain: Secondary | ICD-10-CM | POA: Diagnosis not present

## 2019-12-24 LAB — CBC WITH DIFFERENTIAL/PLATELET
Abs Immature Granulocytes: 0.03 10*3/uL (ref 0.00–0.07)
Basophils Absolute: 0.1 10*3/uL (ref 0.0–0.1)
Basophils Relative: 2 %
Eosinophils Absolute: 0.4 10*3/uL (ref 0.0–0.5)
Eosinophils Relative: 7 %
HCT: 35.4 % — ABNORMAL LOW (ref 36.0–46.0)
Hemoglobin: 11.9 g/dL — ABNORMAL LOW (ref 12.0–15.0)
Immature Granulocytes: 1 %
Lymphocytes Relative: 18 %
Lymphs Abs: 1.1 10*3/uL (ref 0.7–4.0)
MCH: 31.4 pg (ref 26.0–34.0)
MCHC: 33.6 g/dL (ref 30.0–36.0)
MCV: 93.4 fL (ref 80.0–100.0)
Monocytes Absolute: 0.6 10*3/uL (ref 0.1–1.0)
Monocytes Relative: 10 %
Neutro Abs: 3.8 10*3/uL (ref 1.7–7.7)
Neutrophils Relative %: 62 %
Platelets: 297 10*3/uL (ref 150–400)
RBC: 3.79 MIL/uL — ABNORMAL LOW (ref 3.87–5.11)
RDW: 14.1 % (ref 11.5–15.5)
WBC: 6 10*3/uL (ref 4.0–10.5)
nRBC: 0 % (ref 0.0–0.2)

## 2019-12-24 LAB — MAGNESIUM: Magnesium: 2.2 mg/dL (ref 1.7–2.4)

## 2019-12-24 LAB — BASIC METABOLIC PANEL
Anion gap: 11 (ref 5–15)
BUN: 26 mg/dL — ABNORMAL HIGH (ref 8–23)
CO2: 25 mmol/L (ref 22–32)
Calcium: 9.2 mg/dL (ref 8.9–10.3)
Chloride: 100 mmol/L (ref 98–111)
Creatinine, Ser: 1.36 mg/dL — ABNORMAL HIGH (ref 0.44–1.00)
GFR, Estimated: 38 mL/min — ABNORMAL LOW (ref >60)
Glucose, Bld: 145 mg/dL — ABNORMAL HIGH (ref 70–99)
Potassium: 4.3 mmol/L (ref 3.5–5.1)
Sodium: 136 mmol/L (ref 135–145)

## 2019-12-24 LAB — URINE CULTURE

## 2019-12-24 MED ORDER — ADULT MULTIVITAMIN W/MINERALS CH
1.0000 | ORAL_TABLET | Freq: Every day | ORAL | Status: DC
  Administered 2019-12-24 – 2019-12-26 (×3): 1 via ORAL
  Filled 2019-12-24 (×3): qty 1

## 2019-12-24 MED ORDER — ENSURE ENLIVE PO LIQD
237.0000 mL | Freq: Two times a day (BID) | ORAL | Status: DC
  Administered 2019-12-24 – 2019-12-26 (×5): 237 mL via ORAL

## 2019-12-24 MED ORDER — ALUM & MAG HYDROXIDE-SIMETH 200-200-20 MG/5ML PO SUSP
30.0000 mL | ORAL | Status: DC | PRN
  Administered 2019-12-25 – 2019-12-26 (×3): 30 mL via ORAL
  Filled 2019-12-24 (×4): qty 30

## 2019-12-24 NOTE — Discharge Summary (Addendum)
Physician Discharge Summary  [name]:[id number] DOB: 08/09/1931 DOA: 12/22/2019  PCP: [name], DO  Admit date: 12/22/2019 Discharge date: 12/26/2019  Time spent: 45 minutes  Recommendations for Outpatient Follow-up:  Patient will be discharged to home with home health physical therapy.  Patient will need to follow up with primary care provider within one week of discharge, repeat BMP.  Follow up with cardiology. Patient should continue medications as prescribed.  Patient should follow a heart healthy diet.   Discharge Diagnoses:  Chest pain Urinary tract infection with hematuria Acute kidney injury on chronic kidney disease, stage IIIb IBS with diarrhea Paroxysmal atrial fibrillation/VT  Hyponatremia Essential hypertension Prolonged QT interval Anxiety/depression Nephrolithiasis Hyperlipidemia  History of DVT GERD Moderate malnutrition  Discharge Condition: Stable  Diet recommendation: heart healthy  Filed Weights   12/24/19 0441 12/25/19 0703 12/26/19 0344  Weight: 58.6 kg 59.2 kg 60.6 kg    History of present illness:  On 12/23/2019 by Dr. Fuller Plan [name] is a 86 y.o. female with medical history significant of atrial fibrillation on Eliquis, CAD, HTN, HLD, AICD, CKD stage III, IBS, nephrolithiasis, arthritis, history of COVID-19 in 12/2018, and GERD with esophagitis who presented with complaints of left-sided chest pain.  Symptoms have been intermittent for weeks, but acutely worse over the last 4 days.  She describes it as a tightness in her chest like there is a weight sitting there.  Pain does not radiate anywhere.  She complains of getting sweaty when pain symptoms occur.  Notes associated symptoms of dyspnea with exertion, dizziness with changes in position, dysuria, upper abdominal pain, left flank pain, and diarrhea.  Patient notes that she has been having about 4 bowel movements per day on average for some time and thinks that she  may be a little dehydrated.  Denies having any significant calf pain, leg swelling, she has been taking 1 or 2 nitroglycerin with some relief in pain, but notes that it does not always completely resolve after doing so.  Yesterday, patient had went to her PCP office originally for a right knee joint injection due to significant arthritis.  However was noted to have an abnormal EKG and sent to the hospital for further evaluation.  She had just recently been hospitalized after having a syncopal  Hospital Course:  Chest pain -High-sensitivity troponin negative x2 -EKG without ischemia -No further chest pain -Cardiology consulted and appreciated -Suspect symptoms are related to episode of atrial fibrillation over the weekend, continue to medically manage  Urinary tract infection with hematuria -Patient denies any dysuria at this time and states she has had a history of urinary tract infections -Previous cultures were positive for E. coli as well as Enterococcus faecalis -Patient was placed on IV Rocephin and received 3 doses -Urine culture showed multiple species present  Acute kidney injury on chronic kidney disease, stage IIIb -Creatinine was 1.63 on admission, baseline approximately 1.3. -Creatinine today 1.23  IBS with diarrhea -Chronic issue  Paroxysmal atrial fibrillation/VT  -CHA2DS2-VASc score 6 -Continue amiodarone, Eliquis -s/p AICD- Was interrogated in the ED, did not have any episodes with ventricular rates greater than 200  Hyponatremia -Acute on chronic -resolved, sodium 135 today -repeat BMP in one week  Essential hypertension -Continue hydralazine, metoprolol, Imdur  Prolonged QT interval -Initially QTc was 514, down to 485  -potassium 3.6- given supplementation -keep Magnesium >2, Potassium >4 -repeat BMP in one week  Anxiety/depression -Continue Paxil -Elavil held due to prolonged QT- would discuss continuing  this medication with her  PCP  Nephrolithiasis -Patient did have left-sided flank pain Saturday evening -Recent renal ultrasound on 12/3 showed large bilateral renal cortical atrophy with bilateral nonobstructing nephrolithiasis -Outpatient follow-up  Hyperlipidemia  -continue statin  History of DVT -Continue Eliquis  GERD -PPI was held due to prolonged QT  Moderate malnutrition -nutrition consulted, continue supplements  Procedures: None  Consultations: Cardiology  Discharge Exam: Vitals:   12/26/19 0617 12/26/19 0832  BP: (!) 182/83 (!) 164/68  Pulse: (!) 51 (!) 52  Resp:  20  Temp:    SpO2: 98% 98%     General: Well developed, elderly, NAD, appears stated age  HEENT: NCAT, mucous membranes moist.  Neck: Supple, no JVD, no masses  Cardiovascular: S1 S2 auscultated, bradycardic, no murmur  Respiratory: Clear to auscultation bilaterally, no wheezing   Abdomen: Soft, nontender, nondistended, + bowel sounds  Extremities: warm dry without cyanosis clubbing or edema  Neuro: AAOx3, nonfocal  Psych: Appropriate mood and affect, pleasant   Discharge Instructions Discharge Instructions    Discharge instructions   Complete by: As directed    Patient will be discharged to home with home health physical therapy.  Patient will need to follow up with primary care provider within one week of discharge, repeat BMP and discuss the need for continuing Elavil.  Follow up with cardiology. Patient should continue medications as prescribed.  Patient should follow a heart healthy diet.   Increase activity slowly   Complete by: As directed      Allergies as of 12/26/2019      Reactions   Sotalol Other (See Comments)   Torsades   Bactrim [sulfamethoxazole-trimethoprim]    Itchy rash   Ciprofloxacin Nausea Only   Tramadol Other (See Comments)   Hallucination, bad-dream   Actos [pioglitazone] Swelling   Adhesive [tape] Other (See Comments)   Causes sores   Atorvastatin Other (See Comments)    myalgia   Contrast Media [iodinated Diagnostic Agents] Other (See Comments)   Headache (no action/pre-med required)   Latex Rash   Pedi-pre Tape Spray [wound Dressing Adhesive] Other (See Comments)   Causes sores   Sulfa Antibiotics Other (See Comments)   Causes sores      Medication List    STOP taking these medications   hydrocortisone 25 MG suppository Commonly known as: ANUSOL-HC   linaclotide 145 MCG Caps capsule Commonly known as: Linzess   traMADol 50 MG tablet Commonly known as: ULTRAM     TAKE these medications   amiodarone 200 MG tablet Commonly known as: PACERONE TAKE 1 TABLET DAILY   amitriptyline 10 MG tablet Commonly known as: ELAVIL Take 1 tablet (10 mg total) by mouth at bedtime.   apixaban 2.5 MG Tabs tablet Commonly known as: ELIQUIS Take 1 tablet (2.5 mg total) by mouth 2 (two) times daily. What changed:   medication strength  how much to take   carboxymethylcellulose 0.5 % Soln Commonly known as: REFRESH PLUS Place 1 drop into both eyes 3 (three) times daily as needed (dry/irritated eyes.).   CENTRUM SILVER ULTRA WOMENS PO Take 1 tablet by mouth daily.   diphenoxylate-atropine 2.5-0.025 MG tablet Commonly known as: LOMOTIL TAKE 1 TABLET EVERY 8 HOURS AS NEEDED FOR DIARRHEA What changed: See the new instructions.   feeding supplement Liqd Take 237 mLs by mouth 2 (two) times daily between meals.   hydrALAZINE 10 MG tablet Commonly known as: APRESOLINE TAKE 1 TABLET EVERY 8 HOURS What changed:   how much to take  how to take this  when to take this  additional instructions   isosorbide mononitrate 30 MG 24 hr tablet Commonly known as: IMDUR TAKE ONE HALF (1/2) TABLET DAILY What changed: See the new instructions.   levothyroxine 75 MCG tablet Commonly known as: SYNTHROID TAKE 1 TABLET DAILY BEFORE BREAKFAST   loratadine 10 MG tablet Commonly known as: CLARITIN Take 1 tablet (10 mg total) by mouth daily. (for  itching) What changed:   when to take this  reasons to take this  additional instructions   meclizine 25 MG tablet Commonly known as: ANTIVERT Take 25 mg by mouth 3 (three) times daily as needed for dizziness or nausea.   metoprolol tartrate 25 MG tablet Commonly known as: LOPRESSOR TAKE  (1)  TABLET TWICE A DAY. What changed: See the new instructions.   nitroGLYCERIN 0.4 MG SL tablet Commonly known as: NITROSTAT Place 1 tablet (0.4 mg total) under the tongue every 5 (five) minutes as needed for chest pain. What changed: reasons to take this   pantoprazole 40 MG tablet Commonly known as: PROTONIX Take 1 tablet (40 mg total) by mouth every evening. What changed: when to take this   PARoxetine 20 MG tablet Commonly known as: PAXIL TAKE (1) TABLET DAILY IN THE MORNING. What changed: See the new instructions.   rosuvastatin 10 MG tablet Commonly known as: CRESTOR Take 1 tablet (10 mg total) by mouth daily. Start taking on: December 27, 2019 What changed:   medication strength  See the new instructions.   torsemide 20 MG tablet Commonly known as: DEMADEX Take 1 tablet (20 mg total) by mouth daily as needed (edema).   Vitamin D 50 MCG (2000 UT) Caps Take 2,000 Units by mouth daily.      Allergies  Allergen Reactions  . Sotalol Other (See Comments)    Torsades   . Bactrim [Sulfamethoxazole-Trimethoprim]     Itchy rash  . Ciprofloxacin Nausea Only  . Tramadol Other (See Comments)    Hallucination, bad-dream  . Actos [Pioglitazone] Swelling  . Adhesive [Tape] Other (See Comments)    Causes sores  . Atorvastatin Other (See Comments)    myalgia  . Contrast Media [Iodinated Diagnostic Agents] Other (See Comments)    Headache (no action/pre-med required)  . Latex Rash  . Pedi-Pre Tape Spray [Wound Dressing Adhesive] Other (See Comments)    Causes sores  . Sulfa Antibiotics Other (See Comments)    Causes sores    Follow-up Information    [facility], [facility]  Follow up.   Specialty: [place] Why: A representative from [facility] will contact you to arrange start dte and time for your therapy. Contact information: [address] [place] [address] [phone]                The results of significant diagnostics from this hospitalization (including imaging, microbiology, ancillary and laboratory) are listed below for reference.    Significant Diagnostic Studies: DG Chest 2 View  Result Date: 12/22/2019 CLINICAL DATA:  Central chest pain for 2 days. EXAM: CHEST - 2 VIEW COMPARISON:  11/14/2019 FINDINGS: An ICD remains in place. The cardiac silhouette remains mildly enlarged. Aortic atherosclerosis is noted. There is mild chronic coarsening of the interstitial markings. No confluent airspace opacity, overt pulmonary edema, sizable pleural effusion, or pneumothorax is identified. The bones appear diffusely osteopenic, and there is a chronic lower thoracic compression fracture. IMPRESSION: No active cardiopulmonary disease. Electronically Signed   By: Zenia Resides  Jeralyn Ruths M.D.   On: 12/22/2019 14:44   US RENAL  Result Date: 12/19/2019 CLINICAL DATA:  Stage III B chronic kidney disease EXAM: RENAL / URINARY TRACT ULTRASOUND COMPLETE COMPARISON:  CTA 11/18/2019 FINDINGS: Right Kidney: Renal measurements: 9.5 x 4.1 x 4.2 cm = volume: 86 mL. Renal cortical echogenicity is normal. There is marked renal cortical thinning, however. No hydronephrosis. A 7 mm calculus is seen within the interpolar region of the right kidney. No intrarenal masses are seen. Left Kidney: Renal measurements: 8.6 x 3.2 x 3.4 cm = volume: 49 mL. Renal cortical echogenicity is normal. There is marked renal cortical thinning, however. No hydronephrosis. A 6 mm nonobstructing calculus is seen within the lower pole. No enhancing intrarenal masses. Bladder: Appears normal for degree of bladder distention. Other: None. IMPRESSION: Marked bilateral renal cortical  atrophy. Bilateral nonobstructing nephrolithiasis. Electronically Signed   By: Fidela Salisbury MD   On: 12/19/2019 03:46    Microbiology: Recent Results (from the past 240 hour(s))  Resp Panel by RT-PCR (Flu A&B, Covid) Nasopharyngeal Swab     Status: None   Collection Time: 12/23/19  9:32 AM   Specimen: Nasopharyngeal Swab; Nasopharyngeal(NP) swabs in vial transport medium  Result Value Ref Range Status   SARS Coronavirus 2 by RT PCR NEGATIVE NEGATIVE Final    Comment: (NOTE) SARS-CoV-2 target nucleic acids are NOT DETECTED.  The SARS-CoV-2 RNA is generally detectable in upper respiratory specimens during the acute phase of infection. The lowest concentration of SARS-CoV-2 viral copies this assay can detect is 138 copies/mL. A negative result does not preclude SARS-Cov-2 infection and should not be used as the sole basis for treatment or other patient management decisions. A negative result may occur with  improper specimen collection/handling, submission of specimen other than nasopharyngeal swab, presence of viral mutation(s) within the areas targeted by this assay, and inadequate number of viral copies(<138 copies/mL). A negative result must be combined with clinical observations, patient history, and epidemiological information. The expected result is Negative.  Fact Sheet for Patients:  EntrepreneurPulse.com.au  Fact Sheet for Healthcare Providers:  IncredibleEmployment.be  This test is no t yet approved or cleared by the [place] FDA and  has been authorized for detection and/or diagnosis of SARS-CoV-2 by FDA under an Emergency Use Authorization (EUA). This EUA will remain  in effect (meaning this test can be used) for the duration of the COVID-19 declaration under Section 564(b)(1) of the Act, 21 U.S.C.section 360bbb-3(b)(1), unless the authorization is terminated  or revoked sooner.       Influenza A by PCR NEGATIVE NEGATIVE  Final   Influenza B by PCR NEGATIVE NEGATIVE Final    Comment: (NOTE) The Xpert Xpress SARS-CoV-2/FLU/RSV plus assay is intended as an aid in the diagnosis of influenza from Nasopharyngeal swab specimens and should not be used as a sole basis for treatment. Nasal washings and aspirates are unacceptable for Xpert Xpress SARS-CoV-2/FLU/RSV testing.  Fact Sheet for Patients: EntrepreneurPulse.com.au  Fact Sheet for Healthcare Providers: IncredibleEmployment.be  This test is not yet approved or cleared by the [place] FDA and has been authorized for detection and/or diagnosis of SARS-CoV-2 by FDA under an Emergency Use Authorization (EUA). This EUA will remain in effect (meaning this test can be used) for the duration of the COVID-19 declaration under Section 564(b)(1) of the Act, 21 U.S.C. section 360bbb-3(b)(1), unless the authorization is terminated or revoked.  Performed at [facility], [place] [address], [place], [place] [address]   Culture, Urine  Status: Abnormal   Collection Time: 12/23/19  1:02 PM   Specimen: Urine, Random  Result Value Ref Range Status   Specimen Description URINE, RANDOM  Final   Special Requests   Final    NONE Performed at [facility], [address], [place], [place] [address]    Culture MULTIPLE SPECIES PRESENT, SUGGEST RECOLLECTION (A)  Final   Report Status 12/24/2019 FINAL  Final     Labs: Basic Metabolic Panel: Recent Labs  Lab 12/22/19 1316 12/24/19 0127 12/25/19 0347 12/26/19 0312  NA 131* 136 135 135  K 4.2 4.3 3.8 3.6  CL 99 100 101 101  CO2 23 25 25 26   GLUCOSE 142* 145* 105* 99  BUN 20 26* 25* 16  CREATININE 1.63* 1.36* 1.69* 1.23*  CALCIUM 9.3 9.2 9.2 8.8*  MG  --  2.2  --   --    Liver Function Tests: No results for input(s): AST, ALT, ALKPHOS, BILITOT, PROT, ALBUMIN in the last 168 hours. No results for input(s): LIPASE, AMYLASE in the last 168 hours. No results  for input(s): AMMONIA in the last 168 hours. CBC: Recent Labs  Lab 12/22/19 1316 12/24/19 0127  WBC 9.1 6.0  NEUTROABS  --  3.8  HGB 11.7* 11.9*  HCT 34.5* 35.4*  MCV 95.0 93.4  PLT 286 297   Cardiac Enzymes: No results for input(s): CKTOTAL, CKMB, CKMBINDEX, TROPONINI in the last 168 hours. BNP: BNP (last 3 results) Recent Labs    11/14/19 2157  BNP 109.0*    ProBNP (last 3 results) No results for input(s): PROBNP in the last 8760 hours.  CBG: No results for input(s): GLUCAP in the last 168 hours.     Signed:  Cristal Ford  [facility] 12/26/2019, 8:48 AM

## 2019-12-24 NOTE — Progress Notes (Signed)
PROGRESS NOTE    [name]  QMG:[id number] DOB: [birth year] DOA: 12/22/2019 PCP: [name], DO   Brief Narrative:  On 12/23/2019 by Dr. Fuller Plan Cristy Hilts a 86 y.o.femalewith medical history significant ofatrial fibrillation on Eliquis, CAD, HTN, HLD,AICD, CKD stage III, IBS, nephrolithiasis, arthritis, history of COVID-19 in 12/2018, and GERD with esophagitis who presented with complaints of left-sided chest pain. Symptoms have been intermittent for weeks, but acutely worse over the last 4 days. She describes it as a tightness in her chest like there is a weight sitting there. Pain does not radiate anywhere. She complains of getting sweaty when pain symptoms occur. Notes associated symptoms of dyspnea with exertion, dizziness with changes in position, dysuria, upper abdominal pain, left flank pain, and diarrhea. Patient notes that she has been having about 4 bowel movements per day on average for some time and thinks that she may be a little dehydrated. Denies having any significant calf pain, leg swelling, she has been taking 1 or 2 nitroglycerin with some relief in pain, but notes that it does not always completely resolve after doing so. Yesterday, patient had went to her PCP office originally for a right knee joint injection due to significant arthritis. However was noted to have an abnormal EKG and sent to the hospital for further evaluation.She had just recently been hospitalized after having a syncopal  Assessment & Plan   Chest pain -High-sensitivity troponin negative x2 -EKG without ischemia -No further chest pain -Cardiology consulted and appreciated -Suspect symptoms are related to episode of atrial fibrillation over the weekend, continue to medically manage  Urinary tract infection with hematuria -Patient denies any dysuria at this time and states she has had a history of urinary tract infections -Previous cultures were positive for E.  coli as well as Enterococcus faecalis -Patient was placed on IV Rocephin -Urine culture pending  Acute kidney injury on chronic kidney disease, stage IIIb -Creatinine was 1.63 on admission, baseline approximately 1.3. -Creatinine today down to 1.38  IBS with diarrhea -Chronic issue  Paroxysmal atrial fibrillation/VT  -CHA2DS2-VASc score 6 -Continue amiodarone, Eliquis -s/p AICD- Was interrogated in the ED, did not have any episodes with ventricular rates greater than 200  Hyponatremia -Acute on chronic -resolved, sodium 136 today  Essential hypertension -Continue hydralazine, metoprolol, Imdur  Prolonged QT interval -Initially QTc was 514, down to 485 today -Magnesium >2, Potassium >4  Anxiety/depression -Continue Paxil -Elavil held due to prolonged QT  Nephrolithiasis -Patient did have left-sided flank pain Saturday evening -Recent renal ultrasound on 12/3 showed large bilateral renal cortical atrophy with bilateral nonobstructing nephrolithiasis -Outpatient follow-up  Hyperlipidemia  -continue statin  History of DVT -Continue Eliquis  GERD -PPI was held due to prolonged QT  Generalized weakness -likely secondary to the above -will have PT evaluate patient  DVT Prophylaxis  Eliquis  Code Status: Full  Family Communication: None at bedside  Disposition Plan:  Status is: Observation  The patient will require care spanning > 2 midnights and should be moved to inpatient because: Inpatient level of care appropriate due to severity of illness  Dispo: The patient is from: Home              Anticipated d/c is to: Home              Anticipated d/c date is: 1 day              Patient currently is not medically stable to d/c.   Consultants  Cardiology  Procedures  None  Antibiotics   Anti-infectives (From admission, onward)   Start     Dose/Rate Route Frequency Ordered Stop   12/23/19 1200  cefTRIAXone (ROCEPHIN) 1 g in sodium chloride 0.9 %  100 mL IVPB        1 g 200 mL/hr over 30 Minutes Intravenous Every 24 hours 12/23/19 1055        Subjective:   [name] seen and examined today.  Has no complaints at this time other than weakness.  Denies chest pain or shortness of breath, abdominal pain, nausea or vomiting, dizziness or headache. Objective:   Vitals:   12/24/19 0008 12/24/19 0441 12/24/19 0839 12/24/19 1109  BP: (!) 165/78 (!) 163/73 (!) 145/59 (!) 143/64  Pulse: 61 (!) 55 (!) 52 (!) 50  Resp: 16 16 20 20   Temp: 98 F (36.7 C) 98.2 F (36.8 C) 98 F (36.7 C) 98.2 F (36.8 C)  TempSrc: Oral Oral Oral Oral  SpO2: 96% 95% 99% 99%  Weight:  58.6 kg    Height:        Intake/Output Summary (Last 24 hours) at 12/24/2019 1335 Last data filed at 12/24/2019 0600 Gross per 24 hour  Intake 337.08 ml  Output 1150 ml  Net -812.92 ml   Filed Weights   12/23/19 2032 12/24/19 0441  Weight: 60.3 kg 58.6 kg    Exam  General: Well developed, elderly, NAD  HEENT: NCAT,  mucous membranes moist.   Cardiovascular: S1 S2 auscultated, bradycardic  Respiratory: Clear to auscultation bilaterally with equal chest rise  Abdomen: Soft, nontender, nondistended, + bowel sounds  Extremities: warm dry without cyanosis clubbing or edema  Neuro: AAOx3, nonfocal  Psych: appropriate mood and affect   Data Reviewed: I have personally reviewed following labs and imaging studies  CBC: Recent Labs  Lab 12/22/19 1316 12/24/19 0127  WBC 9.1 6.0  NEUTROABS  --  3.8  HGB 11.7* 11.9*  HCT 34.5* 35.4*  MCV 95.0 93.4  PLT 286 314   Basic Metabolic Panel: Recent Labs  Lab 12/22/19 1316 12/24/19 0127  NA 131* 136  K 4.2 4.3  CL 99 100  CO2 23 25  GLUCOSE 142* 145*  BUN 20 26*  CREATININE 1.63* 1.36*  CALCIUM 9.3 9.2  MG  --  2.2   GFR: Estimated Creatinine Clearance: 24.6 mL/min (A) (by C-G formula based on SCr of 1.36 mg/dL (H)). Liver Function Tests: No results for input(s): AST, ALT, ALKPHOS, BILITOT,  PROT, ALBUMIN in the last 168 hours. No results for input(s): LIPASE, AMYLASE in the last 168 hours. No results for input(s): AMMONIA in the last 168 hours. Coagulation Profile: No results for input(s): INR, PROTIME in the last 168 hours. Cardiac Enzymes: No results for input(s): CKTOTAL, CKMB, CKMBINDEX, TROPONINI in the last 168 hours. BNP (last 3 results) No results for input(s): PROBNP in the last 8760 hours. HbA1C: No results for input(s): HGBA1C in the last 72 hours. CBG: No results for input(s): GLUCAP in the last 168 hours. Lipid Profile: Recent Labs    12/23/19 2104  CHOL 164  HDL 66  LDLCALC 86  TRIG 61  CHOLHDL 2.5   Thyroid Function Tests: No results for input(s): TSH, T4TOTAL, FREET4, T3FREE, THYROIDAB in the last 72 hours. Anemia Panel: No results for input(s): VITAMINB12, FOLATE, FERRITIN, TIBC, IRON, RETICCTPCT in the last 72 hours. Urine analysis:    Component Value Date/Time   COLORURINE AMBER (A) 12/23/2019 0932   APPEARANCEUR CLOUDY (A)  12/23/2019 0932   APPEARANCEUR Clear 11/26/2019 1301   LABSPEC 1.012 12/23/2019 0932   PHURINE 5.0 12/23/2019 0932   GLUCOSEU NEGATIVE 12/23/2019 0932   HGBUR LARGE (A) 12/23/2019 0932   BILIRUBINUR NEGATIVE 12/23/2019 0932   BILIRUBINUR Negative 11/26/2019 1301   KETONESUR NEGATIVE 12/23/2019 0932   PROTEINUR 30 (A) 12/23/2019 0932   UROBILINOGEN negative 03/15/2015 1352   UROBILINOGEN 0.2 07/25/2011 2213   NITRITE NEGATIVE 12/23/2019 0932   LEUKOCYTESUR MODERATE (A) 12/23/2019 0932   Sepsis Labs: @LABRCNTIP (procalcitonin:4,lacticidven:4)  ) Recent Results (from the past 240 hour(s))  Resp Panel by RT-PCR (Flu A&B, Covid) Nasopharyngeal Swab     Status: None   Collection Time: 12/23/19  9:32 AM   Specimen: Nasopharyngeal Swab; Nasopharyngeal(NP) swabs in vial transport medium  Result Value Ref Range Status   SARS Coronavirus 2 by RT PCR NEGATIVE NEGATIVE Final    Comment: (NOTE) SARS-CoV-2 target nucleic  acids are NOT DETECTED.  The SARS-CoV-2 RNA is generally detectable in upper respiratory specimens during the acute phase of infection. The lowest concentration of SARS-CoV-2 viral copies this assay can detect is 138 copies/mL. A negative result does not preclude SARS-Cov-2 infection and should not be used as the sole basis for treatment or other patient management decisions. A negative result may occur with  improper specimen collection/handling, submission of specimen other than nasopharyngeal swab, presence of viral mutation(s) within the areas targeted by this assay, and inadequate number of viral copies(<138 copies/mL). A negative result must be combined with clinical observations, patient history, and epidemiological information. The expected result is Negative.  Fact Sheet for Patients:  EntrepreneurPulse.com.au  Fact Sheet for Healthcare Providers:  IncredibleEmployment.be  This test is no t yet approved or cleared by the [place] FDA and  has been authorized for detection and/or diagnosis of SARS-CoV-2 by FDA under an Emergency Use Authorization (EUA). This EUA will remain  in effect (meaning this test can be used) for the duration of the COVID-19 declaration under Section 564(b)(1) of the Act, 21 U.S.C.section 360bbb-3(b)(1), unless the authorization is terminated  or revoked sooner.       Influenza A by PCR NEGATIVE NEGATIVE Final   Influenza B by PCR NEGATIVE NEGATIVE Final    Comment: (NOTE) The Xpert Xpress SARS-CoV-2/FLU/RSV plus assay is intended as an aid in the diagnosis of influenza from Nasopharyngeal swab specimens and should not be used as a sole basis for treatment. Nasal washings and aspirates are unacceptable for Xpert Xpress SARS-CoV-2/FLU/RSV testing.  Fact Sheet for Patients: EntrepreneurPulse.com.au  Fact Sheet for Healthcare Providers: IncredibleEmployment.be  This  test is not yet approved or cleared by the [place] FDA and has been authorized for detection and/or diagnosis of SARS-CoV-2 by FDA under an Emergency Use Authorization (EUA). This EUA will remain in effect (meaning this test can be used) for the duration of the COVID-19 declaration under Section 564(b)(1) of the Act, 21 U.S.C. section 360bbb-3(b)(1), unless the authorization is terminated or revoked.  Performed at [facility], [place] [address], [place], [place] [address]   Culture, Urine     Status: Abnormal   Collection Time: 12/23/19  1:02 PM   Specimen: Urine, Random  Result Value Ref Range Status   Specimen Description URINE, RANDOM  Final   Special Requests   Final    NONE Performed at [facility], [place] [address], [place], [place] [address]    Culture MULTIPLE SPECIES PRESENT, SUGGEST RECOLLECTION (A)  Final   Report Status  12/24/2019 FINAL  Final      Radiology Studies: DG Chest 2 View  Result Date: 12/22/2019 CLINICAL DATA:  Central chest pain for 2 days. EXAM: CHEST - 2 VIEW COMPARISON:  11/14/2019 FINDINGS: An ICD remains in place. The cardiac silhouette remains mildly enlarged. Aortic atherosclerosis is noted. There is mild chronic coarsening of the interstitial markings. No confluent airspace opacity, overt pulmonary edema, sizable pleural effusion, or pneumothorax is identified. The bones appear diffusely osteopenic, and there is a chronic lower thoracic compression fracture. IMPRESSION: No active cardiopulmonary disease. Electronically Signed   By: Logan Bores M.D.   On: 12/22/2019 14:44     Scheduled Meds: . amiodarone  200 mg Oral Daily  . apixaban  2.5 mg Oral BID  . feeding supplement  237 mL Oral BID BM  . hydrALAZINE  10 mg Oral Q8H  . influenza vaccine adjuvanted  0.5 mL Intramuscular Tomorrow-1000  . isosorbide mononitrate  15 mg Oral Daily  . levothyroxine  75 mcg Oral QAC breakfast  . metoprolol tartrate  25 mg Oral BID  .  multivitamin with minerals  1 tablet Oral Daily  . PARoxetine  20 mg Oral Daily  . rosuvastatin  20 mg Oral Daily   Continuous Infusions: . cefTRIAXone (ROCEPHIN)  IV 1 g (12/24/19 1050)     LOS: 1 day   Time Spent in minutes   45 minutes  Loukisha Gunnerson D.O. on 12/24/2019 at 1:35 PM  Between 7am to 7pm - Please see pager noted on [email or web address]  After 7pm go to [email or web address]  And look for the night coverage person covering for me after hours  [facility] Office  [phone]

## 2019-12-24 NOTE — TOC Initial Note (Addendum)
ransition of Care [facility]) - Initial/Assessment Note    Patient Details  Name: [name] MRN: [id number] Date of Birth: 1931-11-09  Transition of [facility]) CM/SW Contact:    Ninfa Meeker, RN Phone Number: 12/24/2019, 3:32 PM  Clinical Narrative:    86 yr old female from home, admitted with chest pain likely secondary to UTI. Case manager spoke with patient who states she is [facility] with [facility], but isn't  sure if she is going to her home or to her son [name]'s home. Also spoke with her son [name] who asked that I contact his sister [name]([phone]), who lives near patient as well. CM spoke with [name] who states that they aren't sure where patient will go at [employer]. She lives next door to her son [name] and his wife and she stayed with them for a few weeks after previous hospitalization. Case Manager explained that [facility] referral has been made and that after family decides where patient will recover they can notify Case Manager. Patient is alert and oriented and daughter understands that she has the right to go to her own home if she decides to. [name] says that she does the laundry, housework and errands for her mom and they all call her frequently to check on her. Case manager discovered that patient is Active with [facility], spoke with [name] with [facility], they will follow for discharge.    Expected Discharge Plan: [place] Barriers to Discharge: Continued Medical Work up   Patient Goals and CMS Choice Patient states their goals for this hospitalization and ongoing recovery are:: go home CMS Medicare.gov Compare Post Acute Care list provided to:: Patient Choice offered to / list presented to : Patient  Expected Discharge Plan and Services Expected Discharge Plan: [place]   Discharge Planning Services: CM Consult   Living arrangements for the past 2 months: Single Family Home                              HH Arranged: PT HH [facility]: [facility] Date HH Agency Contacted: 12/24/19 Time [place]: 25 Representative spoke with at [facility]: [name] Arrangements/Services Living arrangements for the past 2 months: [place] with:: Self Patient language and need for interpreter reviewed:: Yes Do you feel safe going back to the place where you live?: Yes      Need for Family Participation in Patient Care: Yes (Comment)     Criminal Activity/Legal Involvement Pertinent to Current Situation/Hospitalization: No - Comment as needed  Activities of Daily Living Home Assistive Devices/Equipment: Environmental consultant (specify type) ADL Screening (condition at time of admission) Patient's cognitive ability adequate to safely complete daily activities?: Yes Is the patient deaf or have difficulty hearing?: Yes Does the patient have difficulty seeing, even when wearing glasses/contacts?: No Does the patient have difficulty concentrating, remembering, or making decisions?: Yes Patient able to express need for assistance with ADLs?: Yes Does the patient have difficulty dressing or bathing?: No Independently performs ADLs?: Yes (appropriate for developmental age) Does the patient have difficulty walking or climbing stairs?: No Weakness of Legs: None Weakness of Arms/Hands: None  Permission Sought/Granted Permission sought to share information with : Case Manager       Permission granted to share info w AGENCY: Advanced        Emotional Assessment  Orientation: : Oriented to Self, Oriented to Place, Oriented to  Time, Oriented to Situation Alcohol / Substance Use: Not Applicable Psych Involvement: No (comment)  Admission diagnosis:  Chest pressure [R07.89] Chest pain [R07.9] Patient Active Problem List   Diagnosis Date Noted  . Prolonged QT interval 12/23/2019  . Acute lower UTI 12/23/2019  . Constipation 11/27/2019  . Diarrhea  11/27/2019  . Lesion of pancreas 11/26/2019  . Fibromuscular dysplasia of right renal artery ([place]) 11/26/2019  . Left renal artery stenosis ([place]) 11/26/2019  . RUQ pain   . Abnormal CT of the abdomen   . Vertigo 11/15/2019  . Advanced care planning/counseling discussion 11/15/2019  . CAD (coronary artery disease)   . Hypothyroidism   . Hyponatremia   . Volume depletion   . TSH elevation 08/01/2019  . Hospital discharge follow-up 08/01/2019  . Exudative age-related macular degeneration of right eye with active choroidal neovascularization ([place]) 04/30/2019  . Retinal hemorrhage of right eye 04/30/2019  . Advanced nonexudative age-related macular degeneration of left eye with subfoveal involvement 04/30/2019  . Exudative age-related macular degeneration of left eye with inactive choroidal neovascularization ([place]) 04/30/2019  . Advanced nonexudative age-related macular degeneration of right eye without subfoveal involvement 04/30/2019  . Posterior vitreous detachment of both eyes 04/30/2019  . Secondary hypercoagulable state ([place]) 03/11/2019  . Persistent atrial fibrillation ([place])   . Proctitis 01/09/2019  . Atrial fibrillation, chronic ([place]) 01/09/2019  . Hyperlipidemia 01/09/2019  . Nausea and vomiting 01/09/2019  . Hyperglycemia 01/09/2019  . COVID-19 virus infection 12/28/2018  . Chronic diarrhea 09/18/2018  . Abdominal pain 09/18/2018  . Osteoarthritis of right knee 03/29/2018  . GERD (gastroesophageal reflux disease)   . Diarrhea of presumed infectious origin 02/06/2018  . Acute kidney injury superimposed on CKD ([place]) 02/05/2018  . Essential hypertension 12/21/2017  . Degenerative scoliosis 02/19/2017  . Burst fracture of lumbar vertebra ([place]) 02/19/2017  . History of drug-induced prolonged QT interval with torsade de pointes 11/01/2016  . Iron deficiency anemia 10/30/2016  . Long term current use of anticoagulant 02/17/2016  . Near syncope 05/22/2015  . Diabetes mellitus  type 2, diet-controlled ([place]) 09/14/2014  . ICD (implantable cardioverter-defibrillator) battery depletion 01/20/2014  . Abnormal nuclear stress test, 12/30/13 12/31/2013  . Coronary artery disease involving native coronary artery of native heart without angina pectoris 12/31/2013  . Paroxysmal atrial fibrillation, chads2 Vasc2 score of 5, on eliquis 10/16/2013  . Catecholaminergic polymorphic ventricular tachycardia ([place]) 10/16/2013  . Ureteral stone with hydronephrosis 05/20/2013  . Metabolic syndrome 24/82/5003  . Orthostatic hypotension 07/27/2011  . Chest pain 07/25/2011  . Weakness 07/25/2011    Class: Acute  . Chronic sinus bradycardia 07/25/2011  . ICD (implantable cardioverter-defibrillator) in place 07/25/2011  . CKD (chronic kidney disease) stage 3, GFR 30-59 ml/min (HCC) 07/25/2011    Class: Chronic  . History of DVT in the past, on eliquis now 07/25/2011    Class: History of  . Nephrolithiasis, just saw Dr [name] 07/25/2011    Class: History of  . DYSPHAGIA 03/24/2010  . Chronic ischemic heart disease 12/03/2007  . Irritable bowel syndrome with diarrhea 12/03/2007  . EPIGASTRIC PAIN 12/03/2007  . Hyperlipidemia associated with type 2 diabetes mellitus ([place]) 12/02/2007    Class: History of  . EXTERNAL HEMORRHOIDS 12/02/2007    Class: History of  . ESOPHAGITIS 12/02/2007  . GASTRITIS 12/02/2007  . DIVERTICULOSIS, COLON 12/02/2007  . ARTHRITIS 12/02/2007    Class: Chronic   PCP:  [name], DO Pharmacy:  [facility], [address] [place] [address] Phone: [phone] Fax: [phone]     Social Determinants of Health (SDOH) Interventions    Readmission Risk Interventions Readmission Risk Prevention Plan 11/17/2019  Transportation Screening Complete  Medication Review Press photographer) Complete  PCP or Specialist appointment within 3-5 days of discharge Complete  HRI or [place]  Complete  SW Recovery Care/Counseling Consult Complete  [place] Not Applicable  Some recent data might be hidden

## 2019-12-24 NOTE — Progress Notes (Signed)
Initial Nutrition Assessment  DOCUMENTATION CODES:   Not applicable  INTERVENTION:   -Ensure Enlive po BID, each supplement provides 350 kcal and 20 grams of protein -MVI with minerals daily  NUTRITION DIAGNOSIS:   Increased nutrient needs related to chronic illness (CAD) as evidenced by estimated needs.  GOAL:   Patient will meet greater than or equal to 90% of their needs  MONITOR:   PO intake, Supplement acceptance, Diet advancement, Labs, Weight trends, Skin, I & O's  REASON FOR ASSESSMENT:   Malnutrition Screening Tool    ASSESSMENT:   [name] is a 86 y.o. female with medical history significant of atrial fibrillation on Eliquis, CAD, HTN, HLD, AICD, CKD stage III, IBS, nephrolithiasis, arthritis, history of COVID-19 in 12/2018, and GERD with esophagitis who presented with complaints of left-sided chest pain.  Pt admitted with chest pain and UTI with hematuria.   Reviewed I/O's: -812 ml x 24 hours  UOP: 1.2 L x 24 hours  Attempted to speak with pt via call to hospital room phone, however, unable to reach. Unable to obtain further nutrition-related history or complete nutrition-focused physical exam at this time.   Per cardiology notes, suspect chest pain is related to a-fib. No plans for ischemic evaluation.   No meal completion data available at this time to assess. Per MST tool, pt reports experiencing unintentional weight loss PTA.   Reviewed wt hx; UBW is around 139#. Pt has experienced a 6.4% wt loss over the past month, which is significant for time frame.   Pt is at risk for malnutrition given significant weight loss, advanced age, and multiple co-morbidities. Pt would greatly benefit from addition of oral nutrition supplements.   Medications reviewed.   Labs reviewed.   Diet Order:   Diet Order            Diet Heart Room service appropriate? Yes; Fluid consistency: Thin  Diet effective now                 EDUCATION NEEDS:   No  education needs have been identified at this time  Skin:  Skin Assessment: Reviewed RN Assessment  Last BM:  12/23/19  Height:   Ht Readings from Last 1 Encounters:  12/23/19 5\' 3"  (1.6 m)    Weight:   Wt Readings from Last 1 Encounters:  12/24/19 58.6 kg    Ideal Body Weight:  52.3 kg  BMI:  Body mass index is 22.88 kg/m.  Estimated Nutritional Needs:   Kcal:  1500-1700  Protein:  70-85 grams  Fluid:  > 1.5 L    Loistine Chance, RD, LDN, [place] Registered Dietitian II Certified Diabetes Care and Education Specialist Please refer to [facility] for RD and/or RD on-call/weekend/after hours pager

## 2019-12-24 NOTE — Discharge Instructions (Signed)

## 2019-12-24 NOTE — Evaluation (Signed)
Physical Therapy Evaluation Patient Details Name: [name] MRN: [id number] DOB: 01-19-1931 Today's Date: 12/24/2019   History of Present Illness  Pt is an 86 y/o female admitted secondary to chest pain. Per notes, likely related to a fib. Pt also with UTI. PMH includes a fib, CKD, DVT, COVID, HTN, s/p AICD, and CAD.   Clinical Impression  Pt admitted secondary to problem above with deficits below. Pt requiring min guard to min A for mobility tasks using RW. Pt with increased fatigue which limited gait distance. Educated about need for assist at d/c, and pt reports she could stay with her family. Feel she would benefit from HHPT at d/c. Will continue to follow acutely.     Follow Up Recommendations Home health PT;Supervision/Assistance - 24 hour (24/7 initially)    Equipment Recommendations  None recommended by PT    Recommendations for Other Services       Precautions / Restrictions Precautions Precautions: Fall Restrictions Weight Bearing Restrictions: No      Mobility  Bed Mobility Overal bed mobility: Needs Assistance Bed Mobility: Sit to Supine       Sit to supine: Supervision   General bed mobility comments: Supervision to return to supine.     Transfers Overall transfer level: Needs assistance Equipment used: Rolling walker (2 wheeled) Transfers: Sit to/from Stand Sit to Stand: Min guard;Min assist         General transfer comment: Min guard to min A for steadying assist. Cues for safe hand placement.   Ambulation/Gait Ambulation/Gait assistance: Min guard Gait Distance (Feet): 75 Feet Assistive device: Rolling walker (2 wheeled) Gait Pattern/deviations: Step-through pattern;Decreased stride length;Trunk flexed Gait velocity: Decreased   General Gait Details: Slow, guarded gait. Pt with increased fatigue and required standing rest X2.   Stairs            Wheelchair Mobility    Modified Rankin (Stroke Patients Only)       Balance  Overall balance assessment: Needs assistance Sitting-balance support: No upper extremity supported;Feet supported Sitting balance-Leahy Scale: Fair     Standing balance support: Bilateral upper extremity supported Standing balance-Leahy Scale: Poor Standing balance comment: Reliant on BUE support                              Pertinent Vitals/Pain Pain Assessment: No/denies pain    Home Living Family/patient expects to be discharged to:: Private residence Living Arrangements: Alone Available Help at Discharge: Family;Available PRN/intermittently Type of Home: House Home Access: Stairs to enter Entrance Stairs-Rails: None Entrance Stairs-Number of Steps: 1 Home Layout: One level Home Equipment: Bedside commode;Wheelchair - Visual merchandiser - 2 wheels;Walker - 4 wheels;Grab bars - tub/shower;Hand held shower head;Cane - single point      Prior Function Level of Independence: Needs assistance   Gait / Transfers Assistance Needed: Uses RW vs cane for ambulation   ADL's / Homemaking Assistance Needed: independent with BADLs; assistance for cleaning and laundry        Hand Dominance        Extremity/Trunk Assessment   Upper Extremity Assessment Upper Extremity Assessment: Generalized weakness    Lower Extremity Assessment Lower Extremity Assessment: Generalized weakness    Cervical / Trunk Assessment Cervical / Trunk Assessment: Kyphotic  Communication   Communication: HOH  Cognition Arousal/Alertness: Awake/alert Behavior During Therapy: WFL for tasks assessed/performed Overall Cognitive Status: Within Functional Limits for tasks assessed  General Comments General comments (skin integrity, edema, etc.): Educated about needing assist at d/c, and pt reports family can assist if needed.     Exercises     Assessment/Plan    PT Assessment Patient needs continued PT services  PT  Problem List Decreased strength;Decreased balance;Decreased activity tolerance;Decreased mobility;Cardiopulmonary status limiting activity       PT Treatment Interventions DME instruction;Gait training;Therapeutic activities;Therapeutic exercise;Functional mobility training;Balance training;Patient/family education    PT Goals (Current goals can be found in the Care Plan section)  Acute Rehab PT Goals Patient Stated Goal: to go home PT Goal Formulation: With patient Time For Goal Achievement: 01/07/20 Potential to Achieve Goals: Fair    Frequency Min 3X/week   Barriers to discharge        Co-evaluation               AM-PAC PT "6 Clicks" Mobility  Outcome Measure Help needed turning from your back to your side while in a flat bed without using bedrails?: None Help needed moving from lying on your back to sitting on the side of a flat bed without using bedrails?: A Little Help needed moving to and from a bed to a chair (including a wheelchair)?: A Little Help needed standing up from a chair using your arms (e.g., wheelchair or bedside chair)?: A Little Help needed to walk in hospital room?: A Little Help needed climbing 3-5 steps with a railing? : A Lot 6 Click Score: 18    End of Session Equipment Utilized During Treatment: Gait belt Activity Tolerance: Patient limited by fatigue Patient left: in bed;with call bell/phone within reach Nurse Communication: Mobility status;Other (comment) (pt requesting washup ) PT Visit Diagnosis: Muscle weakness (generalized) (M62.81);Other abnormalities of gait and mobility (R26.89)    Time: 0277-4128 PT Time Calculation (min) (ACUTE ONLY): 18 min   Charges:   PT Evaluation $PT Eval Moderate Complexity: 1 Mod          Reuel Derby, PT, DPT  Acute Rehabilitation Services  Pager: [phone] Office: [phone]   Rudean Hitt 12/24/2019, 2:50 PM

## 2019-12-24 NOTE — Progress Notes (Addendum)
Progress Note  Patient Name: [name] Date of Encounter: 12/24/2019  [facility] Cardiologist: Sanda Klein, MD   Subjective   Feeling weak this morning. No chest pain. Some nausea.   Inpatient Medications    Scheduled Meds: . amiodarone  200 mg Oral Daily  . apixaban  2.5 mg Oral BID  . feeding supplement  237 mL Oral BID BM  . hydrALAZINE  10 mg Oral Q8H  . influenza vaccine adjuvanted  0.5 mL Intramuscular Tomorrow-1000  . isosorbide mononitrate  15 mg Oral Daily  . levothyroxine  75 mcg Oral QAC breakfast  . metoprolol tartrate  25 mg Oral BID  . multivitamin with minerals  1 tablet Oral Daily  . PARoxetine  20 mg Oral Daily  . rosuvastatin  20 mg Oral Daily   Continuous Infusions: . cefTRIAXone (ROCEPHIN)  IV 1 g (12/24/19 1050)   PRN Meds: acetaminophen, diphenoxylate-atropine, loratadine, meclizine, nitroGLYCERIN, polyvinyl alcohol   Vital Signs    Vitals:   12/23/19 2032 12/24/19 0008 12/24/19 0441 12/24/19 0839  BP: (!) 194/85 (!) 165/78 (!) 163/73 (!) 145/59  Pulse: (!) 59 61 (!) 55 (!) 52  Resp: 18 16 16 20   Temp: 98 F (36.7 C) 98 F (36.7 C) 98.2 F (36.8 C) 98 F (36.7 C)  TempSrc: Oral Oral Oral Oral  SpO2: 97% 96% 95% 99%  Weight: 60.3 kg  58.6 kg   Height: 5\' 3"  (1.6 m)       Intake/Output Summary (Last 24 hours) at 12/24/2019 1103 Last data filed at 12/24/2019 0600 Gross per 24 hour  Intake 337.08 ml  Output 1150 ml  Net -812.92 ml   Last 3 Weights 12/24/2019 12/23/2019 12/22/2019  Weight (lbs) 129 lb 3 oz 132 lb 15 oz 136 lb 3.2 oz  Weight (kg) 58.6 kg 60.3 kg 61.78 kg      Telemetry    SB, vpacing intermittently - Personally Reviewed  ECG    SB 54 bpm, 1st degree AVB - Personally Reviewed  Physical Exam  Pleasant older female GEN: No acute distress.   Neck: No JVD Cardiac: RRR, no murmurs, rubs, or gallops.  Respiratory: Clear to auscultation bilaterally. GI: Soft, nontender, non-distended  MS: No edema; No  deformity. Neuro:  Nonfocal  Psych: Normal affect   Labs    High Sensitivity Troponin:   Recent Labs  Lab 12/22/19 1316 12/22/19 2112  TROPONINIHS 8 8      Chemistry Recent Labs  Lab 12/22/19 1316 12/24/19 0127  NA 131* 136  K 4.2 4.3  CL 99 100  CO2 23 25  GLUCOSE 142* 145*  BUN 20 26*  CREATININE 1.63* 1.36*  CALCIUM 9.3 9.2  GFRNONAA 31* 38*  ANIONGAP 9 11     Hematology Recent Labs  Lab 12/22/19 1316 12/24/19 0127  WBC 9.1 6.0  RBC 3.63* 3.79*  HGB 11.7* 11.9*  HCT 34.5* 35.4*  MCV 95.0 93.4  MCH 32.2 31.4  MCHC 33.9 33.6  RDW 14.0 14.1  PLT 286 297    BNPNo results for input(s): BNP, PROBNP in the last 168 hours.   DDimer No results for input(s): DDIMER in the last 168 hours.   Radiology    DG Chest 2 View  Result Date: 12/22/2019 CLINICAL DATA:  Central chest pain for 2 days. EXAM: CHEST - 2 VIEW COMPARISON:  11/14/2019 FINDINGS: An ICD remains in place. The cardiac silhouette remains mildly enlarged. Aortic atherosclerosis is noted. There is mild chronic coarsening of  the interstitial markings. No confluent airspace opacity, overt pulmonary edema, sizable pleural effusion, or pneumothorax is identified. The bones appear diffusely osteopenic, and there is a chronic lower thoracic compression fracture. IMPRESSION: No active cardiopulmonary disease. Electronically Signed   By: [place] Bores M.D.   On: 12/22/2019 14:44    Cardiac Studies   N/a   Patient Profile     86 y.o. female with a hx of CAD, catecholaminergic polymorphic VT s/p ICD, PAF on eliquis, HLD, DM, CKD stage III who was seen the evaluation of chest pain at the request of Dr. Tamala Julian.  Assessment & Plan    1. Chest pain with known CAD: troponin neg x2, EKG this morning without ischemia. No chest pain overnight. Suspect that symptoms were related to episode of afib over the weekend. Will continue to manage medically.   2. PAF/VT with single lead ICD: device interrogated - single lead   - no episodes of ventricular rate over 200, no firing -- continue amiodarone and eliquis 2.5 mg BID for age and renal function  3. Hypertension: Continue home hydralazine, imdur, BB  4. DM: -- per primary  5. UTI: on antibiotics per primary. Suspect this is contributing to her weakness.   6. CKD stage IIIb: Cr stable 1.36 today   For questions or updates, please contact [place] Please consult [email or web address] for contact info under   Signed, Reino Bellis, NP  12/24/2019, 11:03 AM    I have personally seen and examined this patient. I agree with the assessment and plan as outlined above. She has no recurrent chest pain. Troponin negative. Her pain is not felt to be cardiac. No further cardiac workup at this time. We will sign off. Please call with questions.   Lauree Chandler 12/24/2019 11:32 AM

## 2019-12-24 NOTE — TOC Progression Note (Signed)
Transition of Care [facility]) - Progression Note    Patient Details  Name: MAEGAN BULLER MRN: [id number] Date of Birth: 09-27-1931  Transition of Care [facility]) CM/SW Contact  Loletha Grayer Beverely Pace, RN Phone Number: 12/24/2019, 4:16 PM  Clinical Narrative:    86 yr old female from home, admitted with chest pain likely secondary to UTI. Case manager spoke with patient who states she is ok with [facility] Health, but isn't  sure if she is going to her home or to her son Rickie's home. Also spoke with her son [name] who asked that I contact his sister Jeannette([phone]), who lives near patient as well. CM spoke with Tomasa Hosteller who states that they aren't sure where patient will go at [employer]. She lives next door to her son Hortencia Pilar and his wife and she stayed with them for a few weeks after previous hospitalization. Case Manager explained that [place] referral has been made and that after family decides where patient will recover they can notify Case Manager. Patient is alert and oriented and daughter understands that she has the right to go to her own home if she decides to. Tomasa Hosteller says that she does the laundry, housework and errands for her mom and they all call her frequently to check on her. Case manager discovered that patient is Active with [facility], spoke with Clarene Critchley with [facility], they will follow for discharge.     Expected Discharge Plan: [name] Barriers to Discharge: Continued Medical Work up  Expected Discharge Plan and Services Expected Discharge Plan: [place]   Discharge Planning Services: CM Consult Post Acute Care Choice: Resumption of Svcs/PTA Provider Living arrangements for the past 2 months: [place]: PT [place]: [facility] ([facility]) Date [place]: 12/24/19 Time [place]: 1615 Representative spoke with at [place]:  [place] ([place]) Interventions    Readmission Risk Interventions Readmission Risk Prevention Plan 11/17/2019  Transportation Screening Complete  Medication Review Press photographer) Complete  PCP or Specialist appointment within 3-5 days of discharge Complete  HRI or [place] Complete  SW Recovery Care/Counseling Consult Complete  [place] Not Applicable  Some recent data might be hidden

## 2019-12-25 LAB — BASIC METABOLIC PANEL WITH GFR
Anion gap: 9 (ref 5–15)
BUN: 25 mg/dL — ABNORMAL HIGH (ref 8–23)
CO2: 25 mmol/L (ref 22–32)
Calcium: 9.2 mg/dL (ref 8.9–10.3)
Chloride: 101 mmol/L (ref 98–111)
Creatinine, Ser: 1.69 mg/dL — ABNORMAL HIGH (ref 0.44–1.00)
GFR, Estimated: 29 mL/min — ABNORMAL LOW
Glucose, Bld: 105 mg/dL — ABNORMAL HIGH (ref 70–99)
Potassium: 3.8 mmol/L (ref 3.5–5.1)
Sodium: 135 mmol/L (ref 135–145)

## 2019-12-25 MED ORDER — ROSUVASTATIN CALCIUM 5 MG PO TABS
10.0000 mg | ORAL_TABLET | Freq: Every day | ORAL | Status: DC
  Administered 2019-12-26: 10 mg via ORAL
  Filled 2019-12-25: qty 2

## 2019-12-25 MED ORDER — SODIUM CHLORIDE 0.9 % IV SOLN: INTRAVENOUS | Status: AC

## 2019-12-25 NOTE — Progress Notes (Signed)
  Mobility Specialist Criteria Algorithm Info.  Mobility Team: [facility] elevated:Self regulated Activity: Ambulated in hall; Transferred:  Bed to chair (to chair after ambulation) Range of motion: Active; All extremities Level of assistance: Standby assist, set-up cues, supervision of patient - no hands on Assistive device: Front wheel walker Minutes sitting in chair:  Minutes stood: 5 minutes Minutes ambulated: 5 minutes Distance ambulated (ft): 260 ft Mobility response: Tolerated well Bed Position: Chair  Received patient dangling EOB, agreeable to participate in mobility. Ambulated in hallway 260 feet with supervision using RW. Required cues for safety to stand closer to walker and hand placement. Required rest breaks x3 stating she was slightly SOB. Tolerated ambulation well without any complaints and is now sitting in chair with all needs met.   12/25/2019 11:12 AM

## 2019-12-25 NOTE — Progress Notes (Signed)
Nutrition Follow-up  DOCUMENTATION CODES:   Non-severe (moderate) malnutrition in context of chronic illness  INTERVENTION:   -Continue Ensure Enlive po BID, each supplement provides 350 kcal and 20 grams of protein -Continue MVI with minerals daily  NUTRITION DIAGNOSIS:   Moderate Malnutrition related to chronic illness (CAD) as evidenced by mild fat depletion,moderate fat depletion,mild muscle depletion,moderate muscle depletion,percent weight loss.  Ongoing  GOAL:   Patient will meet greater than or equal to 90% of their needs  Progressing   MONITOR:   Supplement acceptance,PO intake,Diet advancement,Labs,Weight trends,Skin,I & O's  REASON FOR ASSESSMENT:   Malnutrition Screening Tool    ASSESSMENT:   [name] is a 86 y.o. female with medical history significant of atrial fibrillation on Eliquis, CAD, HTN, HLD, AICD, CKD stage III, IBS, nephrolithiasis, arthritis, history of COVID-19 in 12/2018, and GERD with esophagitis who presented with complaints of left-sided chest pain.  Reviewed I/O's: +760 ml x 24 hours and -53 ml since admission  UOP: 200 ml x 24 hours  Spoke with pt at bedside, who reports feeling better today. Pt shares she has had a decreased appetite, however, consumed about 50% of her breakfast. Pt reports she consumes about 2 meals per day (Breakfast: fruit, oatmeal, and eggs, snack: Ensure or drinkable yogurt, Dinner: meat, starch, and vegetable).   Reviewed wt hx; UBW is around 139#. Pt has experienced a 6.4% wt loss over the past month, which is significant for time frame. Per pt, her UBW is around 204#. She estimates she has lost about 50# within the past year, related to IBS, decreased appetite, and multiple hospitalizations. Pt shares that she is weak and had to stay with her son after previous hospitalization, but is now living at home and is getting around more slowly than she used to.   Discussed with pt importance of good meal and  supplement intake to promote healing. Pt amenable to continue Ensure.   Labs reviewed.   NUTRITION - FOCUSED PHYSICAL EXAM:  Flowsheet Row Most Recent Value  Orbital Region Mild depletion  Upper Arm Region Mild depletion  Thoracic and Lumbar Region No depletion  Buccal Region Mild depletion  Temple Region Mild depletion  Clavicle Bone Region Moderate depletion  Clavicle and Acromion Bone Region Mild depletion  Scapular Bone Region Mild depletion  Dorsal Hand Mild depletion  Patellar Region Mild depletion  Anterior Thigh Region Mild depletion  Posterior Calf Region Mild depletion  Edema (RD Assessment) Mild  Hair Reviewed  Eyes Reviewed  Mouth Reviewed  Skin Reviewed  Nails Reviewed       Diet Order:   Diet Order            Diet Heart Room service appropriate? Yes; Fluid consistency: Thin  Diet effective now                 EDUCATION NEEDS:   Education needs have been addressed  Skin:  Skin Assessment: Reviewed RN Assessment  Last BM:  12/23/19  Height:   Ht Readings from Last 1 Encounters:  12/23/19 5\' 3"  (1.6 m)    Weight:   Wt Readings from Last 1 Encounters:  12/25/19 59.2 kg    Ideal Body Weight:  52.3 kg  BMI:  Body mass index is 23.13 kg/m.  Estimated Nutritional Needs:   Kcal:  1500-1700  Protein:  70-85 grams  Fluid:  > 1.5 L    Loistine Chance, RD, LDN, [place] Registered Dietitian II Certified Diabetes Care and Education Specialist Please  refer to AMION for RD and/or RD on-call/weekend/after hours pager 

## 2019-12-25 NOTE — Progress Notes (Signed)
PROGRESS NOTE    [name]  QPY:[id number] DOB: 02/09/1931 DOA: 12/22/2019 PCP: [name], DO   Brief Narrative:  HPI On 12/23/2019 by Dr. Fuller Plan [name] a 86 y.o.femalewith medical history significant ofatrial fibrillation on Eliquis, CAD, HTN, HLD,AICD, CKD stage III, IBS, nephrolithiasis, arthritis, history of COVID-19 in 12/2018, and GERD with esophagitis who presented with complaints of left-sided chest pain. Symptoms have been intermittent for weeks, but acutely worse over the last 4 days. She describes it as a tightness in her chest like there is a weight sitting there. Pain does not radiate anywhere. She complains of getting sweaty when pain symptoms occur. Notes associated symptoms of dyspnea with exertion, dizziness with changes in position, dysuria, upper abdominal pain, left flank pain, and diarrhea. Patient notes that she has been having about 4 bowel movements per day on average for some time and thinks that she may be a little dehydrated. Denies having any significant calf pain, leg swelling, she has been taking 1 or 2 nitroglycerin with some relief in pain, but notes that it does not always completely resolve after doing so. Yesterday, patient had went to her PCP office originally for a right knee joint injection due to significant arthritis. However was noted to have an abnormal EKG and sent to the hospital for further evaluation.She had just recently been hospitalized after having a syncopal  Interim history Admitted with chest pain.  Found to have urinary tract infection with hematuria.  Continues to have acute kidney injury today, creatinine back up to 1.68.  Have started her on gentle IV fluids and will monitor BMP. Assessment & Plan   Chest pain -High-sensitivity troponin negative x2 -EKG without ischemia -No further chest pain -Cardiology consulted and appreciated -Suspect symptoms are related to episode of atrial  fibrillation over the weekend, continue to medically manage  Urinary tract infection with hematuria -Patient denies any dysuria at this time and states she has had a history of urinary tract infections -Previous cultures were positive for E. coli as well as Enterococcus faecalis -Patient was placed on IV Rocephin -Urine culture multiple species present  Acute kidney injury on chronic kidney disease, stage IIIb -Creatinine was 1.63 on admission, baseline approximately 1.3. -Creatinine back up to 1.69 today -Will place patient on gentle IV fluids and continue to monitor BMP  IBS with diarrhea -Chronic issue  Paroxysmal atrial fibrillation/VT  -CHA2DS2-VASc score 6 -Continue amiodarone, Eliquis -s/p AICD- Was interrogated in the ED, did not have any episodes with ventricular rates greater than 200  Hyponatremia -Acute on chronic -resolved, sodium 135 today  Essential hypertension -Continue hydralazine, metoprolol, Imdur  Prolonged QT interval -Initially QTc was 514, down to 485 today -Magnesium >2, Potassium >4  Anxiety/depression -Continue Paxil -Elavil held due to prolonged QT  Nephrolithiasis -Patient did have left-sided flank pain Saturday evening -Recent renal ultrasound on 12/3 showed large bilateral renal cortical atrophy with bilateral nonobstructing nephrolithiasis -Outpatient follow-up  Hyperlipidemia  -continue statin  History of DVT -Continue Eliquis  GERD -PPI was held due to prolonged QT  Generalized weakness -likely secondary to the above -PT recommended home health/supervision assistance 24 hours -Pending OT evaluation  DVT Prophylaxis  Eliquis  Code Status: Full  Family Communication: None at bedside  Disposition Plan:  Status is: Inpatient  Remains inpatient appropriate because:Inpatient level of care appropriate due to severity of illness   Dispo: The patient is from: Home  Anticipated d/c is to: Home               Anticipated d/c date is: 2 days              Patient currently is not medically stable to d/c.   Consultants Cardiology  Procedures  None  Antibiotics   Anti-infectives (From admission, onward)   Start     Dose/Rate Route Frequency Ordered Stop   12/23/19 1200  cefTRIAXone (ROCEPHIN) 1 g in sodium chloride 0.9 % 100 mL IVPB        1 g 200 mL/hr over 30 Minutes Intravenous Every 24 hours 12/23/19 1055        Subjective:   [name] seen and examined today.  Continues to feel weakness.  Denies current chest pain or shortness of breath, abdominal pain, nausea or vomiting, dizziness or headache.   Objective:   Vitals:   12/24/19 1109 12/24/19 2049 12/25/19 0600 12/25/19 0703  BP: (!) 143/64 115/67 (!) 179/78   Pulse: (!) 50 (!) 52 (!) 55   Resp: 20 20 19    Temp: 98.2 F (36.8 C) 98 F (36.7 C) 98 F (36.7 C)   TempSrc: Oral Oral Oral   SpO2: 99% 97% 98%   Weight:    59.2 kg  Height:        Intake/Output Summary (Last 24 hours) at 12/25/2019 1004 Last data filed at 12/25/2019 0600 Gross per 24 hour  Intake 720 ml  Output --  Net 720 ml   Filed Weights   12/23/19 2032 12/24/19 0441 12/25/19 0703  Weight: 60.3 kg 58.6 kg 59.2 kg   Exam  General: Well developed, elderly, thin, NAD  HEENT: NCAT,mucous membranes moist.   Cardiovascular: S1 S2 auscultated, bradycardic  Respiratory: Clear to auscultation bilaterally with equal chest rise, no wheezing  Abdomen: Soft, nontender, nondistended, + bowel sounds  Extremities: warm dry without cyanosis clubbing or edema  Neuro: AAOx3, nonfocal  Psych: appropriate mood and affect, pleasant  Data Reviewed: I have personally reviewed following labs and imaging studies  CBC: Recent Labs  Lab 12/22/19 1316 12/24/19 0127  WBC 9.1 6.0  NEUTROABS  --  3.8  HGB 11.7* 11.9*  HCT 34.5* 35.4*  MCV 95.0 93.4  PLT 286 321   Basic Metabolic Panel: Recent Labs  Lab 12/22/19 1316 12/24/19 0127 12/25/19 0347   NA 131* 136 135  K 4.2 4.3 3.8  CL 99 100 101  CO2 23 25 25   GLUCOSE 142* 145* 105*  BUN 20 26* 25*  CREATININE 1.63* 1.36* 1.69*  CALCIUM 9.3 9.2 9.2  MG  --  2.2  --    GFR: Estimated Creatinine Clearance: 19.8 mL/min (A) (by C-G formula based on SCr of 1.69 mg/dL (H)). Liver Function Tests: No results for input(s): AST, ALT, ALKPHOS, BILITOT, PROT, ALBUMIN in the last 168 hours. No results for input(s): LIPASE, AMYLASE in the last 168 hours. No results for input(s): AMMONIA in the last 168 hours. Coagulation Profile: No results for input(s): INR, PROTIME in the last 168 hours. Cardiac Enzymes: No results for input(s): CKTOTAL, CKMB, CKMBINDEX, TROPONINI in the last 168 hours. BNP (last 3 results) No results for input(s): PROBNP in the last 8760 hours. HbA1C: No results for input(s): HGBA1C in the last 72 hours. CBG: No results for input(s): GLUCAP in the last 168 hours. Lipid Profile: Recent Labs    12/23/19 2104  CHOL 164  HDL 66  LDLCALC 86  TRIG 61  CHOLHDL  2.5   Thyroid Function Tests: No results for input(s): TSH, T4TOTAL, FREET4, T3FREE, THYROIDAB in the last 72 hours. Anemia Panel: No results for input(s): VITAMINB12, FOLATE, FERRITIN, TIBC, IRON, RETICCTPCT in the last 72 hours. Urine analysis:    Component Value Date/Time   COLORURINE AMBER (A) 12/23/2019 0932   APPEARANCEUR CLOUDY (A) 12/23/2019 0932   APPEARANCEUR Clear 11/26/2019 1301   LABSPEC 1.012 12/23/2019 0932   PHURINE 5.0 12/23/2019 0932   GLUCOSEU NEGATIVE 12/23/2019 0932   HGBUR LARGE (A) 12/23/2019 0932   BILIRUBINUR NEGATIVE 12/23/2019 0932   BILIRUBINUR Negative 11/26/2019 1301   KETONESUR NEGATIVE 12/23/2019 0932   PROTEINUR 30 (A) 12/23/2019 0932   UROBILINOGEN negative 03/15/2015 1352   UROBILINOGEN 0.2 07/25/2011 2213   NITRITE NEGATIVE 12/23/2019 0932   LEUKOCYTESUR MODERATE (A) 12/23/2019 0932   Sepsis Labs: @LABRCNTIP (procalcitonin:4,lacticidven:4)  ) Recent Results  (from the past 240 hour(s))  Resp Panel by RT-PCR (Flu A&B, Covid) Nasopharyngeal Swab     Status: None   Collection Time: 12/23/19  9:32 AM   Specimen: Nasopharyngeal Swab; Nasopharyngeal(NP) swabs in vial transport medium  Result Value Ref Range Status   SARS Coronavirus 2 by RT PCR NEGATIVE NEGATIVE Final    Comment: (NOTE) SARS-CoV-2 target nucleic acids are NOT DETECTED.  The SARS-CoV-2 RNA is generally detectable in upper respiratory specimens during the acute phase of infection. The lowest concentration of SARS-CoV-2 viral copies this assay can detect is 138 copies/mL. A negative result does not preclude SARS-Cov-2 infection and should not be used as the sole basis for treatment or other patient management decisions. A negative result may occur with  improper specimen collection/handling, submission of specimen other than nasopharyngeal swab, presence of viral mutation(s) within the areas targeted by this assay, and inadequate number of viral copies(<138 copies/mL). A negative result must be combined with clinical observations, patient history, and epidemiological information. The expected result is Negative.  Fact Sheet for Patients:  EntrepreneurPulse.com.au  Fact Sheet for Healthcare Providers:  IncredibleEmployment.be  This test is no t yet approved or cleared by the [place] FDA and  has been authorized for detection and/or diagnosis of SARS-CoV-2 by FDA under an Emergency Use Authorization (EUA). This EUA will remain  in effect (meaning this test can be used) for the duration of the COVID-19 declaration under Section 564(b)(1) of the Act, 21 U.S.C.section 360bbb-3(b)(1), unless the authorization is terminated  or revoked sooner.       Influenza A by PCR NEGATIVE NEGATIVE Final   Influenza B by PCR NEGATIVE NEGATIVE Final    Comment: (NOTE) The Xpert Xpress SARS-CoV-2/FLU/RSV plus assay is intended as an aid in the  diagnosis of influenza from Nasopharyngeal swab specimens and should not be used as a sole basis for treatment. Nasal washings and aspirates are unacceptable for Xpert Xpress SARS-CoV-2/FLU/RSV testing.  Fact Sheet for Patients: EntrepreneurPulse.com.au  Fact Sheet for Healthcare Providers: IncredibleEmployment.be  This test is not yet approved or cleared by the [place] FDA and has been authorized for detection and/or diagnosis of SARS-CoV-2 by FDA under an Emergency Use Authorization (EUA). This EUA will remain in effect (meaning this test can be used) for the duration of the COVID-19 declaration under Section 564(b)(1) of the Act, 21 U.S.C. section 360bbb-3(b)(1), unless the authorization is terminated or revoked.  Performed at [facility], [place] [address] [place], [place] [address]   Culture, Urine     Status: Abnormal   Collection Time: 12/23/19  1:02 PM  Specimen: Urine, Random  Result Value Ref Range Status   Specimen Description URINE, RANDOM  Final   Special Requests   Final    NONE Performed at [facility], [address] [place], [place] [address]    Culture MULTIPLE SPECIES PRESENT, SUGGEST RECOLLECTION (A)  Final   Report Status 12/24/2019 FINAL  Final      Radiology Studies: No results found.   Scheduled Meds: . amiodarone  200 mg Oral Daily  . apixaban  2.5 mg Oral BID  . feeding supplement  237 mL Oral BID BM  . hydrALAZINE  10 mg Oral Q8H  . influenza vaccine adjuvanted  0.5 mL Intramuscular Tomorrow-1000  . isosorbide mononitrate  15 mg Oral Daily  . levothyroxine  75 mcg Oral QAC breakfast  . metoprolol tartrate  25 mg Oral BID  . multivitamin with minerals  1 tablet Oral Daily  . PARoxetine  20 mg Oral Daily  . [START ON 12/26/2019] rosuvastatin  10 mg Oral Daily   Continuous Infusions: . sodium chloride 50 mL/hr at 12/25/19 0822  . cefTRIAXone (ROCEPHIN)  IV 1 g (12/24/19 1050)      LOS: 2 days   Time Spent in minutes   45 minutes  Michial Disney D.O. on 12/25/2019 at 10:04 AM  Between 7am to 7pm - Please see pager noted on [email or web address]  After 7pm go to [email or web address]  And look for the night coverage person covering for me after hours  [facility] Office  [phone]

## 2019-12-25 NOTE — Evaluation (Signed)
Occupational Therapy Evaluation Patient Details Name: [name] MRN: [id number] DOB: [birth year] Today's Date: 12/25/2019    History of Present Illness Pt is an 86 y/o female admitted secondary to chest pain. Per notes, likely related to a fib. Pt also with UTI. PMH includes a fib, CKD, DVT, COVID, HTN, s/p AICD, and CAD.    Clinical Impression   Patient admitted with the above diagnosis.  PTA she lived alone, with various supports from here family that live next door and across the street.  Currently she needs Min Guard for mobility, and setup with occasional Min Guard for LB ADL sit/stand level.  She plans on discharging home, but is willing to stay with her son for a few days to a week if recommended.  It does not appear she will need follow up OT at home.      Follow Up Recommendations  No OT follow up;Supervision - Intermittent    Equipment Recommendations  None recommended by OT    Recommendations for Other Services       Precautions / Restrictions Precautions Precautions: Fall Restrictions Weight Bearing Restrictions: No      Mobility Bed Mobility   Bed Mobility: Supine to Sit;Sit to Supine     Supine to sit: Supervision;HOB elevated Sit to supine: Supervision;HOB elevated        Transfers Overall transfer level: Needs assistance   Transfers: Sit to/from Stand Sit to Stand: Supervision              Balance   Sitting-balance support: No upper extremity supported;Feet supported Sitting balance-Leahy Scale: Good     Standing balance support: Bilateral upper extremity supported Standing balance-Leahy Scale: Poor                             ADL either performed or assessed with clinical judgement   ADL Overall ADL's : Needs assistance/impaired Eating/Feeding: Independent;Sitting   Grooming: Wash/dry hands;Wash/dry face;Supervision/safety;Standing   Upper Body Bathing: Set up;Sitting   Lower Body Bathing: Sit to/from  stand;Supervison/ safety   Upper Body Dressing : Set up;Sitting   Lower Body Dressing: Supervision/safety;Sit to/from stand   Toilet Transfer: Min guard;RW;Ambulation   Toileting- Water quality scientist and Hygiene: Supervision/safety;Sit to/from stand                                   Pertinent Vitals/Pain Pain Assessment: No/denies pain     Hand Dominance Right   Extremity/Trunk Assessment Upper Extremity Assessment Upper Extremity Assessment: Overall WFL for tasks assessed   Lower Extremity Assessment Lower Extremity Assessment: Defer to PT evaluation       Communication Communication Communication: HOH   Cognition Arousal/Alertness: Awake/alert Behavior During Therapy: WFL for tasks assessed/performed Overall Cognitive Status: Within Functional Limits for tasks assessed                                     General Comments   patient complaining of indigestion, limited eval this date.      Exercises     Shoulder Instructions      Home Living Family/patient expects to be discharged to:: Private residence Living Arrangements: Alone Available Help at Discharge: Family;Available PRN/intermittently Type of Home: House Home Access: Stairs to enter   Entrance Stairs-Rails: None Home Layout: One level;Laundry or work area  in basement;Able to live on main level with bedroom/bathroom     Bathroom Shower/Tub: Teacher, early years/pre: Handicapped height Bathroom Accessibility: Yes How Accessible: Accessible via walker Home Equipment: Bedside commode;Wheelchair - Visual merchandiser - 2 wheels;Walker - 4 wheels;Grab bars - tub/shower;Hand held shower head;Cane - single point   Additional Comments: daughter lives next door, son across the street.      Prior Functioning/Environment Level of Independence: Independent with assistive device(s)  Gait / Transfers Assistance Needed: Has been using the cane for mobility.  Has  a RW if needed. ADL's / Homemaking Assistance Needed: Supervision for showers, son assists with meds, she completes light meal prep and light home management.  Daughter assists with heavy home management. Family helps with community mobility.            OT Problem List: Decreased strength;Decreased activity tolerance;Impaired balance (sitting and/or standing)      OT Treatment/Interventions: Self-care/ADL training;Therapeutic exercise;Balance training;Therapeutic activities    OT Goals(Current goals can be found in the care plan section) Acute Rehab OT Goals Patient Stated Goal: I would like to go home OT Goal Formulation: With patient Time For Goal Achievement: 01/08/20 Potential to Achieve Goals: Good  OT Frequency: Min 2X/week   Barriers to D/C:    none noted       Co-evaluation              AM-PAC OT "6 Clicks" Daily Activity     Outcome Measure Help from another person eating meals?: None Help from another person taking care of personal grooming?: A Little Help from another person toileting, which includes using toliet, bedpan, or urinal?: A Little Help from another person bathing (including washing, rinsing, drying)?: A Little Help from another person to put on and taking off regular upper body clothing?: A Little Help from another person to put on and taking off regular lower body clothing?: A Little 6 Click Score: 19   End of Session Equipment Utilized During Treatment: Rolling walker  Activity Tolerance: Patient tolerated treatment well Patient left: in bed;with call bell/phone within reach  OT Visit Diagnosis: Unsteadiness on feet (R26.81)                Time: 6389-3734 OT Time Calculation (min): 18 min Charges:  OT General Charges $OT Visit: 1 Visit OT Evaluation $OT Eval Moderate Complexity: 1 Mod  12/25/2019  [name], [name]  Acute Rehabilitation Services  Office:  [phone]   [name] 12/25/2019, 2:40 PM

## 2019-12-26 DIAGNOSIS — E440 Moderate protein-calorie malnutrition: Secondary | ICD-10-CM | POA: Insufficient documentation

## 2019-12-26 LAB — BASIC METABOLIC PANEL
Anion gap: 8 (ref 5–15)
BUN: 16 mg/dL (ref 8–23)
CO2: 26 mmol/L (ref 22–32)
Calcium: 8.8 mg/dL — ABNORMAL LOW (ref 8.9–10.3)
Chloride: 101 mmol/L (ref 98–111)
Creatinine, Ser: 1.23 mg/dL — ABNORMAL HIGH (ref 0.44–1.00)
GFR, Estimated: 43 mL/min — ABNORMAL LOW (ref >60)
Glucose, Bld: 99 mg/dL (ref 70–99)
Potassium: 3.6 mmol/L (ref 3.5–5.1)
Sodium: 135 mmol/L (ref 135–145)

## 2019-12-26 MED ORDER — APIXABAN 2.5 MG PO TABS
2.5000 mg | ORAL_TABLET | Freq: Two times a day (BID) | ORAL | 1 refills | Status: DC
Start: 2019-12-26 — End: 2020-02-11

## 2019-12-26 MED ORDER — POTASSIUM CHLORIDE CRYS ER 20 MEQ PO TBCR
40.0000 meq | EXTENDED_RELEASE_TABLET | Freq: Once | ORAL | Status: AC
  Administered 2019-12-26: 40 meq via ORAL
  Filled 2019-12-26: qty 2

## 2019-12-26 MED ORDER — ENSURE ENLIVE PO LIQD
237.0000 mL | Freq: Two times a day (BID) | ORAL | Status: DC
Start: 2019-12-26 — End: 2022-08-30

## 2019-12-26 MED ORDER — ROSUVASTATIN CALCIUM 10 MG PO TABS
10.0000 mg | ORAL_TABLET | Freq: Every day | ORAL | 1 refills | Status: DC
Start: 2019-12-27 — End: 2020-02-24

## 2019-12-26 NOTE — Progress Notes (Signed)
D/C instructions given and reviewed. Questions asked and answered. Tele and IV removed, tolerated well.

## 2019-12-26 NOTE — TOC Transition Note (Signed)
Transition of Care [facility]) - CM/SW Discharge Note   Patient Details  Name: [name] MRN: [id number] Date of Birth: April 23, 1931  Transition of Care [facility]) CM/SW Contact:  Zenon Mayo, RN Phone Number: 12/26/2019, 9:40 AM   Clinical Narrative:    Patient is for dc today,she is set up with [facility] for HHPT, NCM notified Helene Kelp and notified her son.  Patient will be going to son address at 1902 right beside her , per [facility] please call [phone].     Final next level of care: [place] Barriers to Discharge: No Barriers Identified   Patient Goals and CMS Choice Patient states their goals for this hospitalization and ongoing recovery are:: home , get better CMS Medicare.gov Compare Post Acute Care list provided to:: Patient Choice offered to / list presented to : Patient  Discharge Placement                       Discharge Plan and Services   Discharge Planning Services: CM Consult Post Acute Care Choice: Resumption of Svcs/PTA Provider            DME Agency: NA       HH Arranged: PT [place] Agency: [facility] (formerly [employer]) Date [place]: 12/26/19 Time [place]: [phone] Representative spoke with at [place]: [place] ([place]) Interventions     Readmission Risk Interventions Readmission Risk Prevention Plan 11/17/2019  Transportation Screening Complete  Medication Review Press photographer) Complete  PCP or Specialist appointment within 3-5 days of discharge Complete  HRI or [place] Complete  SW Recovery Care/Counseling Consult Complete  [place] Not Applicable  Some recent data might be hidden

## 2019-12-26 NOTE — TOC Progression Note (Signed)
Transition of Care [facility]) - Progression Note    Patient Details  Name: [name] MRN: [id number] Date of Birth: 1931-02-01  Transition of Care [facility]) CM/SW Contact  Zenon Mayo, RN Phone Number: 12/26/2019, 9:29 AM  Clinical Narrative:    Patient  Spoke with patient , she gave me permission to call her son [name], I called him, his wife states he will call me back in 5 mins.  This NCM need to verify what address she is going to at discharge.   Expected Discharge Plan: [place] Barriers to Discharge: Continued Medical Work up  Expected Discharge Plan and Services Expected Discharge Plan: [place]   Discharge Planning Services: CM Consult Post Acute Care Choice: Resumption of Svcs/PTA Provider Living arrangements for the past 2 months: Single Family Home Expected Discharge Date: 12/26/19                         HH Arranged: PT [place]: [place] (now [facility]) Date [place]: 12/24/19 Time [place]: 1615 Representative spoke with at [place]: [place] ([place]) Interventions    Readmission Risk Interventions Readmission Risk Prevention Plan 11/17/2019  Transportation Screening Complete  Medication Review Press photographer) Complete  PCP or Specialist appointment within 3-5 days of discharge Complete  HRI or [place] Complete  SW Recovery Care/Counseling Consult Complete  [place] Not Applicable  Some recent data might be hidden

## 2019-12-26 NOTE — Plan of Care (Signed)

## 2019-12-26 NOTE — Care Management Important Message (Signed)
Important Message  Patient Details  Name: [name] MRN: [id number] Date of Birth: 11-May-1931   Medicare Important Message Given:  Yes     Orbie Pyo 12/26/2019, 1:31 PM

## 2019-12-26 NOTE — Progress Notes (Signed)
Physical Therapy Treatment Patient Details Name: [name] MRN: [id number] DOB: 04-16-1931 Today's Date: 12/26/2019    History of Present Illness Pt is an 86 y/o female admitted secondary to chest pain. Per notes, likely related to a fib. Pt also with UTI. PMH includes a fib, CKD, DVT, COVID, HTN, s/p AICD, and CAD.     PT Comments    Patient progressing well towards PT goals. Improved ambulation distance with Min guard assist for balance/safety and use of RW for support. Requires a few standing rest breaks. Reports right knee pain towards end of ambulation; premorbid issues with knee. Reports she cannot think of things this morning but eager to d/c. VSS. Will follow.    Follow Up Recommendations  Home health PT;Supervision/Assistance - 24 hour     Equipment Recommendations  None recommended by PT    Recommendations for Other Services       Precautions / Restrictions Precautions Precautions: Fall Restrictions Weight Bearing Restrictions: No    Mobility  Bed Mobility               General bed mobility comments: Sitting EOB upon PT arrival.  Transfers Overall transfer level: Needs assistance Equipment used: Rolling walker (2 wheeled) Transfers: Sit to/from Stand Sit to Stand: Min guard         General transfer comment: Min guard for safety. Requires 2 attempts to rise from EOB. Transferred to chair post ambulation.  Ambulation/Gait Ambulation/Gait assistance: Min guard Gait Distance (Feet): 220 Feet Assistive device: Rolling walker (2 wheeled) Gait Pattern/deviations: Step-through pattern;Decreased stride length;Trunk flexed Gait velocity: Decreased   General Gait Details: Slow, guarded gait. Cues for RW proximity. A few standing rest breaks. VSS.   Stairs             Wheelchair Mobility    Modified Rankin (Stroke Patients Only)       Balance Overall balance assessment: Needs assistance Sitting-balance support: No upper extremity  supported;Feet supported Sitting balance-Leahy Scale: Good     Standing balance support: During functional activity Standing balance-Leahy Scale: Poor Standing balance comment: Reliant on BUE support;pushed RW aside to get to chair and very unsteady.                            Cognition Arousal/Alertness: Awake/alert Behavior During Therapy: WFL for tasks assessed/performed Overall Cognitive Status: No family/caregiver present to determine baseline cognitive functioning                                 General Comments: HOH so needs repetition at times, has difficulty recalling things today, "I just cannot think this morning"      Exercises      General Comments        Pertinent Vitals/Pain Pain Assessment: Faces Faces Pain Scale: Hurts little more Pain Location: right knee with ambulation Pain Descriptors / Indicators: Sore Pain Intervention(s): Monitored during session;Repositioned    Home Living                      Prior Function            PT Goals (current goals can now be found in the care plan section) Progress towards PT goals: Progressing toward goals    Frequency    Min 3X/week      PT Plan Current plan remains appropriate    Co-evaluation  AM-PAC PT "6 Clicks" Mobility   Outcome Measure  Help needed turning from your back to your side while in a flat bed without using bedrails?: None Help needed moving from lying on your back to sitting on the side of a flat bed without using bedrails?: A Little Help needed moving to and from a bed to a chair (including a wheelchair)?: A Little Help needed standing up from a chair using your arms (e.g., wheelchair or bedside chair)?: A Little Help needed to walk in hospital room?: A Little Help needed climbing 3-5 steps with a railing? : A Little 6 Click Score: 19    End of Session Equipment Utilized During Treatment: Gait belt Activity Tolerance: Patient  tolerated treatment well Patient left: in chair;with call bell/phone within reach Nurse Communication: Mobility status PT Visit Diagnosis: Muscle weakness (generalized) (M62.81);Other abnormalities of gait and mobility (R26.89)     Time: 6761-9509 PT Time Calculation (min) (ACUTE ONLY): 20 min  Charges:  $Gait Training: 8-22 mins                     Marisa Severin, PT, DPT Acute Rehabilitation Services Pager [phone] Office [place] 12/26/2019, 10:00 AM

## 2019-12-26 NOTE — Progress Notes (Signed)
  Mobility Specialist Criteria Algorithm Info.  Mobility Team: HOB elevated: Activity: Ambulated in hall; Dangled on edge of bed Range of motion: Active; All extremities Level of assistance: Standby assist, set-up cues, supervision of patient - no hands on Assistive device: Front wheel walker Minutes sitting in chair:  Minutes stood: 5 minutes Minutes ambulated: 5 minutes Distance ambulated (ft): 200 ft Mobility response: Tolerated well Bed Position: Semi-fowlers  Pt agreed to participate in mobility, eager to get discharged. Ambulated in hall with steady gait, 200 feet using RW. Tolerated ambulation well without any complaints and is now dangling EOB with all needs met.   12/26/2019 2:43 PM

## 2019-12-29 ENCOUNTER — Telehealth: Payer: Self-pay

## 2019-12-29 NOTE — Telephone Encounter (Signed)
Patient was in in clinic the day of her visit for knee pain and would have had injections done but unfortunately we had to send her to the hospital for heart problems.  You can schedule patient on my earliest opening or they can schedule with a different provider to get the knee shots because I am off  for 10 days starting Wednesday please notify patient.  Thank you

## 2019-12-29 NOTE — Telephone Encounter (Signed)
Patient has appointment.

## 2019-12-29 NOTE — Telephone Encounter (Signed)
Spoke with patients son and he states he was in the room when [facility] seen patient .  States JE told them to call when she was out of hospital for her to get seen with JE for a knee injection.  You have no openings until 12/28- please advise

## 2020-01-05 ENCOUNTER — Other Ambulatory Visit: Payer: Self-pay | Admitting: Family Medicine

## 2020-01-05 ENCOUNTER — Ambulatory Visit (INDEPENDENT_AMBULATORY_CARE_PROVIDER_SITE_OTHER): Payer: Medicare Other | Admitting: Family Medicine

## 2020-01-05 ENCOUNTER — Ambulatory Visit: Payer: Medicare Other | Admitting: Gastroenterology

## 2020-01-05 ENCOUNTER — Other Ambulatory Visit: Payer: Self-pay

## 2020-01-05 ENCOUNTER — Encounter: Payer: Self-pay | Admitting: Family Medicine

## 2020-01-05 VITALS — BP 141/74 | HR 51 | Ht 63.0 in | Wt 136.0 lb

## 2020-01-05 DIAGNOSIS — Z23 Encounter for immunization: Secondary | ICD-10-CM | POA: Diagnosis not present

## 2020-01-05 DIAGNOSIS — R531 Weakness: Secondary | ICD-10-CM | POA: Diagnosis not present

## 2020-01-05 DIAGNOSIS — I701 Atherosclerosis of renal artery: Secondary | ICD-10-CM | POA: Diagnosis not present

## 2020-01-05 DIAGNOSIS — R5383 Other fatigue: Secondary | ICD-10-CM | POA: Diagnosis not present

## 2020-01-05 NOTE — Progress Notes (Addendum)
BP (!) 141/74   Pulse (!) 51   Ht 5' 3"  (1.6 m)   Wt 136 lb (61.7 kg)   SpO2 98%   BMI 24.09 kg/m    Subjective:   Patient ID: [name], female    DOB: 07-28-1931, 86 y.o.   MRN: [id number]  HPI: [name] is a 86 y.o. female presenting on 01/05/2020 for Hospitalization Follow-up   HPI Coming in today for hospital follow-up.  Patient was in the ED this time from 12/22/2019 admitted to the hospital and discharged on 12/26/2019.  She was initially in for chest pain and pressure but then is also been having fatigue and weakness.  She says the fatigue and weakness have been going on for couple months since she was in the hospital in October but the chest pain was new prior to this hospitalization.  She had a significant cardiac work-up and they did not find any answers and her chest pain is resolved but she still just feels tired and fatigued and weak and just not feeling as well.  Patient says her energy has been down significantly and she is tired all the time and sleeps longer through the day, she does have her daughter here with her and says that she has been having the same issues for the past couple months since they started the Elavil for abdominal pain.  Patient also complains of a knee pain and arthritis which she has had knee injections before and is getting the flu vaccine today so we will defer that to a future visit.  Relevant past medical, surgical, family and social history reviewed and updated as indicated. Interim medical history since our last visit reviewed. Allergies and medications reviewed and updated.  Review of Systems  Constitutional: Negative for chills and fever.  Eyes: Negative for visual disturbance.  Respiratory: Negative for chest tightness and shortness of breath.   Cardiovascular: Negative for chest pain and leg swelling.  Gastrointestinal: Negative for abdominal pain, diarrhea, nausea and vomiting.  Genitourinary: Negative for difficulty  urinating and dysuria.  Musculoskeletal: Negative for back pain and gait problem.  Skin: Negative for rash.  Neurological: Negative for light-headedness and headaches.  Psychiatric/Behavioral: Positive for dysphoric mood. Negative for agitation, behavioral problems, self-injury, sleep disturbance and suicidal ideas. The patient is nervous/anxious.   All other systems reviewed and are negative.   Per HPI unless specifically indicated above   Allergies as of 01/05/2020      Reactions   Sotalol Other (See Comments)   Torsades   Bactrim [sulfamethoxazole-trimethoprim]    Itchy rash   Ciprofloxacin Nausea Only   Tramadol Other (See Comments)   Hallucination, bad-dream   Actos [pioglitazone] Swelling   Adhesive [tape] Other (See Comments)   Causes sores   Atorvastatin Other (See Comments)   myalgia   Contrast Media [iodinated Diagnostic Agents] Other (See Comments)   Headache (no action/pre-med required)   Latex Rash   Pedi-pre Tape Spray [wound Dressing Adhesive] Other (See Comments)   Causes sores   Sulfa Antibiotics Other (See Comments)   Causes sores      Medication List       Accurate as of January 05, 2020 11:47 AM. If you have any questions, ask your nurse or doctor.        amiodarone 200 MG tablet Commonly known as: PACERONE TAKE 1 TABLET DAILY   amitriptyline 10 MG tablet Commonly known as: ELAVIL Take 1 tablet (10 mg total) by mouth at bedtime.  apixaban 2.5 MG Tabs tablet Commonly known as: ELIQUIS Take 1 tablet (2.5 mg total) by mouth 2 (two) times daily.   carboxymethylcellulose 0.5 % Soln Commonly known as: REFRESH PLUS Place 1 drop into both eyes 3 (three) times daily as needed (dry/irritated eyes.).   CENTRUM SILVER ULTRA WOMENS PO Take 1 tablet by mouth daily.   diphenoxylate-atropine 2.5-0.025 MG tablet Commonly known as: LOMOTIL TAKE 1 TABLET EVERY 8 HOURS AS NEEDED FOR DIARRHEA What changed: See the new instructions.   feeding  supplement Liqd Take 237 mLs by mouth 2 (two) times daily between meals.   hydrALAZINE 10 MG tablet Commonly known as: APRESOLINE TAKE 1 TABLET EVERY 8 HOURS What changed:   how much to take  how to take this  when to take this  additional instructions   isosorbide mononitrate 30 MG 24 hr tablet Commonly known as: IMDUR TAKE ONE HALF (1/2) TABLET DAILY What changed: See the new instructions.   levothyroxine 75 MCG tablet Commonly known as: SYNTHROID TAKE 1 TABLET DAILY BEFORE BREAKFAST   loratadine 10 MG tablet Commonly known as: CLARITIN Take 1 tablet (10 mg total) by mouth daily. (for itching) What changed:   when to take this  reasons to take this  additional instructions   meclizine 25 MG tablet Commonly known as: ANTIVERT Take 25 mg by mouth 3 (three) times daily as needed for dizziness or nausea.   metoprolol tartrate 25 MG tablet Commonly known as: LOPRESSOR TAKE  (1)  TABLET TWICE A DAY. What changed: See the new instructions.   nitroGLYCERIN 0.4 MG SL tablet Commonly known as: NITROSTAT Place 1 tablet (0.4 mg total) under the tongue every 5 (five) minutes as needed for chest pain. What changed: reasons to take this   pantoprazole 40 MG tablet Commonly known as: PROTONIX Take 1 tablet (40 mg total) by mouth every evening. What changed: when to take this   PARoxetine 20 MG tablet Commonly known as: PAXIL TAKE (1) TABLET DAILY IN THE MORNING. What changed: See the new instructions.   rosuvastatin 10 MG tablet Commonly known as: CRESTOR Take 1 tablet (10 mg total) by mouth daily.   torsemide 20 MG tablet Commonly known as: DEMADEX Take 1 tablet (20 mg total) by mouth daily as needed (edema).   Vitamin D 50 MCG (2000 UT) Caps Take 2,000 Units by mouth daily.        Objective:   BP (!) 141/74   Pulse (!) 51   Ht 5' 3"  (1.6 m)   Wt 136 lb (61.7 kg)   SpO2 98%   BMI 24.09 kg/m   Wt Readings from Last 3 Encounters:  01/05/20 136 lb  (61.7 kg)  12/26/19 133 lb 9.6 oz (60.6 kg)  12/22/19 136 lb 3.2 oz (61.8 kg)    Physical Exam Vitals and nursing note reviewed.  Constitutional:      General: She is not in acute distress.    Appearance: She is well-developed and well-nourished. She is not diaphoretic.  Eyes:     Extraocular Movements: EOM normal.     Conjunctiva/sclera: Conjunctivae normal.  Cardiovascular:     Rate and Rhythm: Normal rate and regular rhythm.     Pulses: Intact distal pulses.     Heart sounds: Normal heart sounds. No murmur heard.   Pulmonary:     Effort: Pulmonary effort is normal. No respiratory distress.     Breath sounds: Normal breath sounds. No wheezing.  Musculoskeletal:  General: No tenderness or edema. Normal range of motion.  Skin:    General: Skin is warm and dry.     Findings: No rash.  Neurological:     Mental Status: She is alert and oriented to person, place, and time.     Coordination: Coordination normal.  Psychiatric:        Mood and Affect: Mood and affect normal.        Behavior: Behavior normal.       Assessment & Plan:   Problem List Items Addressed This Visit      Other   Weakness - Primary   Relevant Orders   CBC with Differential/Platelet   CMP14+EGFR   TSH    Other Visit Diagnoses    Other fatigue       Relevant Orders   CBC with Differential/Platelet   CMP14+EGFR   TSH      Stop elavil and will 3-4 weeks and see how energy is doing.  Patient was taking amitriptyline for abdominal pain, we will see if that comes back in follow-up in 3 to 4 weeks.  Also do flu vaccine today Follow up plan: Return if symptoms worsen or fail to improve, for 3-4 weeks knee injection and fatigue recheck.  Counseling provided for all of the vaccine components No orders of the defined types were placed in this encounter.   Caryl Pina, MD [facility] 01/05/2020, 11:47 AM

## 2020-01-06 LAB — CBC WITH DIFFERENTIAL/PLATELET
Basophils Absolute: 0.1 10*3/uL (ref 0.0–0.2)
Basos: 1 %
EOS (ABSOLUTE): 0.6 10*3/uL — ABNORMAL HIGH (ref 0.0–0.4)
Eos: 12 %
Hematocrit: 35.2 % (ref 34.0–46.6)
Hemoglobin: 11.5 g/dL (ref 11.1–15.9)
Immature Grans (Abs): 0 10*3/uL (ref 0.0–0.1)
Immature Granulocytes: 1 %
Lymphocytes Absolute: 1.3 10*3/uL (ref 0.7–3.1)
Lymphs: 25 %
MCH: 30.7 pg (ref 26.6–33.0)
MCHC: 32.7 g/dL (ref 31.5–35.7)
MCV: 94 fL (ref 79–97)
Monocytes Absolute: 0.6 10*3/uL (ref 0.1–0.9)
Monocytes: 11 %
Neutrophils Absolute: 2.5 10*3/uL (ref 1.4–7.0)
Neutrophils: 50 %
Platelets: 223 10*3/uL (ref 150–450)
RBC: 3.74 x10E6/uL — ABNORMAL LOW (ref 3.77–5.28)
RDW: 13.2 % (ref 11.7–15.4)
WBC: 5 10*3/uL (ref 3.4–10.8)

## 2020-01-06 LAB — CMP14+EGFR
ALT: 53 IU/L — ABNORMAL HIGH (ref 0–32)
AST: 46 IU/L — ABNORMAL HIGH (ref 0–40)
Albumin/Globulin Ratio: 1.7 (ref 1.2–2.2)
Albumin: 4 g/dL (ref 3.6–4.6)
Alkaline Phosphatase: 83 IU/L (ref 44–121)
BUN/Creatinine Ratio: 12 (ref 12–28)
BUN: 16 mg/dL (ref 8–27)
Bilirubin Total: 0.5 mg/dL (ref 0.0–1.2)
CO2: 21 mmol/L (ref 20–29)
Calcium: 9 mg/dL (ref 8.7–10.3)
Chloride: 103 mmol/L (ref 96–106)
Creatinine, Ser: 1.31 mg/dL — ABNORMAL HIGH (ref 0.57–1.00)
GFR calc Af Amer: 43 mL/min/{1.73_m2} — ABNORMAL LOW (ref >59)
GFR calc non Af Amer: 37 mL/min/{1.73_m2} — ABNORMAL LOW (ref >59)
Globulin, Total: 2.3 g/dL (ref 1.5–4.5)
Glucose: 101 mg/dL — ABNORMAL HIGH (ref 65–99)
Potassium: 4 mmol/L (ref 3.5–5.2)
Sodium: 137 mmol/L (ref 134–144)
Total Protein: 6.3 g/dL (ref 6.0–8.5)

## 2020-01-06 LAB — TSH: TSH: 7 u[IU]/mL — ABNORMAL HIGH (ref 0.450–4.500)

## 2020-01-12 ENCOUNTER — Encounter: Payer: Self-pay | Admitting: Cardiovascular Disease

## 2020-01-12 ENCOUNTER — Other Ambulatory Visit: Payer: Self-pay

## 2020-01-12 ENCOUNTER — Ambulatory Visit (INDEPENDENT_AMBULATORY_CARE_PROVIDER_SITE_OTHER): Payer: Medicare Other | Admitting: Cardiovascular Disease

## 2020-01-12 VITALS — BP 132/64 | HR 50 | Ht 63.0 in | Wt 138.0 lb

## 2020-01-12 DIAGNOSIS — I25119 Atherosclerotic heart disease of native coronary artery with unspecified angina pectoris: Secondary | ICD-10-CM

## 2020-01-12 DIAGNOSIS — Z5181 Encounter for therapeutic drug level monitoring: Secondary | ICD-10-CM

## 2020-01-12 DIAGNOSIS — Z95810 Presence of automatic (implantable) cardiac defibrillator: Secondary | ICD-10-CM

## 2020-01-12 DIAGNOSIS — I472 Ventricular tachycardia: Secondary | ICD-10-CM | POA: Diagnosis not present

## 2020-01-12 DIAGNOSIS — I480 Paroxysmal atrial fibrillation: Secondary | ICD-10-CM

## 2020-01-12 DIAGNOSIS — Z79899 Other long term (current) drug therapy: Secondary | ICD-10-CM

## 2020-01-12 DIAGNOSIS — I10 Essential (primary) hypertension: Secondary | ICD-10-CM

## 2020-01-12 DIAGNOSIS — E119 Type 2 diabetes mellitus without complications: Secondary | ICD-10-CM

## 2020-01-12 DIAGNOSIS — I701 Atherosclerosis of renal artery: Secondary | ICD-10-CM | POA: Diagnosis not present

## 2020-01-12 DIAGNOSIS — Z7901 Long term (current) use of anticoagulants: Secondary | ICD-10-CM | POA: Diagnosis not present

## 2020-01-12 DIAGNOSIS — N1832 Chronic kidney disease, stage 3b: Secondary | ICD-10-CM

## 2020-01-12 DIAGNOSIS — E7800 Pure hypercholesterolemia, unspecified: Secondary | ICD-10-CM

## 2020-01-12 DIAGNOSIS — I4729 Other ventricular tachycardia: Secondary | ICD-10-CM

## 2020-01-12 NOTE — Progress Notes (Signed)
. Patient ID: [name], female   DOB: 11-09-1931, 86 y.o.   MRN: [id number]    Cardiology Office Note    Date:  01/12/2020   ID:  [name], DOB 1931/09/12, MRN [id number]  PCP:  [name], DO  Cardiologist:   Sanda Klein, MD   Chief Complaint  Patient presents with  . Irregular Heart Beat    History of Present Illness:  [name] is a 86 y.o. 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  In recent years she has had increasing problems with symptomatic atrial fibrillation.  Although she has a single chamber defibrillator, making it impossible to precisely diagnose the episodes of atrial fibrillation, there is clear evidence of periods of increased ventricular rate from her baseline, likely representing symptomatic atrial fibrillation.  She develops palpitation, chest discomfort, shortness of breath and if the spells are lengthy she eventually develops overt lower extremity edema.  She underwent a cardioversion in January 2021 from atrial fibrillation to normal rhythm, associated with improvement in symptoms.  She has been taking amiodarone since February.  She was hospitalized with dizziness, orthostatic hypotension and severe hypothyroidism (TSH 56) in July of this year.  It is likely she had iatrogenic hypothyroidism due to amiodarone.  Levothyroxine was started, beta-blocker therapy was discontinued.  She was briefly hospitalized December 7-8 with complaints of chest pain and  palpitations, after an electrocardiogram performed in the orthopedic office showed an irregular heart rhythm.  The electrocardiogram that I can see from December 6 and December 7 show background atrial fibrillation with ventricular paced rhythm.  Cardiac enzymes were normal.  She was seen in consultation by Dr. Angelena Form and there was no compelling reason to proceed with coronary invasive evaluation.  Her TSH checked on December 20 remains mildly elevated at 7.000 free T4 has not been checked since September 8 1 was normal at 1.50.  Schooley unchanged at 1.3 and electrolytes were normal.  LDL last checked in December 07 was 86.  Her electrocardiogram today shows sinus bradycardia at about 49 bpm with nearly isorhythmic ventricular pacing at 50 bpm.  This is not truly atrial sensed-ventricular paced rhythm (single-chamber ICD).  Interrogation of her defibrillator (single lead device) shows no recent episodes of high ventricular rates.  The underlying rhythm was sinus bradycardia at 47-48 bpm.  Hysteresis of 10% was programmed to allow more native AV association, today.  The wise normal device function.  She has not had any episodes of high ventricular rate since a very brief episode of polymorphic VT that occurred in April.  Ventricular pacing has increased from 0 to 9% over the last 6 months.  Estimated generator longevity is 11.5 years.   She was on treatment with disopyramide for years for her history of catecholaminergic polymorphic VT, but this medication had to be gradually discontinued due to side effects, especially orthostatic hypotension and dry mouth.  In the past she developed torsades de pointes with sotalol and diarrhea with quinidine.  She has not had defibrillator discharges since the 1990s.   Past Medical History:  Diagnosis Date  . AICD (automatic cardioverter/defibrillator) present   .  Allergy    SESONAL  . Anxiety   . ARTHRITIS   . Arthritis   . Atrial fibrillation ([place])   .  CAD (coronary artery disease)    a. history of cardiac arrest 1993;  b. s/p LAD/LCX stenting in 2003;  c. 07/2011 Cath: LM nl, LAD patent stent, D1 80ost, LCX patent stent, RCA min irregs;  d. 04/2012 MV: EF 66%, no ishcemia;  e. 12/2013 Echo: Ef 55%, no rwma, Gr 1 DD, triv AI/MR, mildly dil LA;  f. 12/2013 Lexi MV: intermediate risk - apical ischemia and inf/infsept fixed defect, ? artifactual.  . CAD in native artery 12/31/2013   Previous stents to LAD and Ramus patent on cath today 12/31/13 also There is severe disease in the ostial first diagonal which is unchanged from most recent cardiac catheterization. The right coronary artery could not be engaged selectively but nonselective angiography showed no significant disease in the proximal and midsegment.     . Cataract    DENIES  . Chronic back pain   . Chronic sinus bradycardia   . CKD (chronic kidney disease), stage III ([place])   . Clotting disorder (HCC)    DVT  . COLITIS 12/02/2007   Qualifier: Diagnosis of  By: Nils Pyle CMA ([place]), [name]    . Diabetes mellitus without complication ([place])    DENIES  . Diverticulosis of colon (without mention of hemorrhage) 2007   Colonoscopy   . Esophageal stricture    a. 2012 s/p dil.  . Esophagitis, unspecified    a. 2012 EGD  . EXTERNAL HEMORRHOIDS   . GERD (gastroesophageal reflux disease)    omeprazole  . H/O hiatal hernia   . Hiatal hernia    a. 2012 EGD.  Marland Kitchen History of DVT in the past, not on Coumadin now    left  leg  . HYPERCHOLESTEROLEMIA   . Hypertension   . ICD (implantable cardiac defibrillator) in place    a. s/p initial ICD in 1993 in setting of cardiac arrest;  b. 01/2007 gen change: Guidant T135 Vitality DS VR single lead ICD.  . Macular degeneration    gets injecton in eye every 5 weeks- last injection - 05/03/2013   . Myocardial infarction ([place]) 1993  . Near syncope 05/22/2015  . Nephrolithiasis, just saw Dr [name]   . Orthostatic hypotension   . Peripheral vascular disease  ([place])    ???  . RECTAL BLEEDING 12/03/2007   Qualifier: Diagnosis of  By: Sharlett Iles MD Byrd Hesselbach   . Unspecified gastritis and gastroduodenitis without mention of hemorrhage    a. 2003 EGD->not noted on 2012 EGD.  Marland Kitchen Unstable angina, neg MI, cath stable.maybe GI 12/30/2013    Past Surgical History:  Procedure Laterality Date  . BREAST BIOPSY Left 11/2013  . CARDIAC CATHETERIZATION    . CARDIAC CATHETERIZATION  01/01/15   patent stents -there is severe disease in ostial 1st diag but without change.   Marland Kitchen CARDIAC DEFIBRILLATOR PLACEMENT  02/05/2007   Guidant  . CARDIOVASCULAR STRESS TEST  12/30/2013   abnormal  . CARDIOVERSION N/A 02/13/2019   Procedure: CARDIOVERSION;  Surgeon: Sanda Klein, MD;  Location: [place];  Service: Cardiovascular;  Laterality: N/A;  . CATARACT EXTRACTION Right    Dr. Katy Fitch  . COLONOSCOPY    . coronary stents     . CYSTOSCOPY WITH URETEROSCOPY Right 05/20/2013   Procedure: CYSTOSCOPY, RIGHT URETEROSCOPY STONE EXTRACTION, Insertion of right DOUBLE J STENT ;  Surgeon: Irine Seal, MD;  Location:  [facility];  Service: Urology;  Laterality: Right;  . CYSTOSCOPY WITH URETEROSCOPY AND STENT PLACEMENT N/A 06/03/2013   Procedure: SECOND LOOK CYSTOSCOPY WITH URETEROSCOPY  HOLMIUM LASER LITHO AND STONE EXTRACTION Sammie Bench ;  Surgeon: Malka So, MD;  Location: [facility];  Service: Urology;  Laterality: N/A;  . ESOPHAGOGASTRODUODENOSCOPY (EGD) WITH PROPOFOL N/A 11/20/2019   Procedure: ESOPHAGOGASTRODUODENOSCOPY (EGD) WITH PROPOFOL;  Surgeon: Daneil Dolin, MD;  [facility];  Service: Endoscopy;  Laterality: N/A;  . GIVENS CAPSULE STUDY N/A 10/30/2016   Procedure: GIVENS CAPSULE STUDY;  Surgeon: Irene Shipper, MD;  Location: [facility];  Service: Endoscopy;  Laterality: N/A;  NEEDS TO BE ADMITTED FOR OBSERVATION PT. HAS DEFIB  . HEMORROIDECTOMY  80's  . HOLMIUM LASER APPLICATION Right 05/19/6642   Procedure: HOLMIUM LASER APPLICATION;  Surgeon: Irine Seal,  MD;  Location: [facility];  Service: Urology;  Laterality: Right;  . HYSTEROSCOPY WITH D & C  09/02/2010   Procedure: DILATATION AND CURETTAGE (D&C) /HYSTEROSCOPY;  Surgeon: Margarette Asal;  Location: [facility];  Service: Gynecology;  Laterality: N/A;  Dilation and Curettage with Hysteroscopy and Polypectomy  . IMPLANTABLE CARDIOVERTER DEFIBRILLATOR (ICD) GENERATOR CHANGE N/A 02/10/2014   Procedure: ICD GENERATOR CHANGE;  Surgeon: Sanda Klein, MD;  Location: [facility];  Service: Cardiovascular;  Laterality: N/A;  . LEFT HEART CATHETERIZATION WITH CORONARY ANGIOGRAM N/A 07/28/2011   Procedure: LEFT HEART CATHETERIZATION WITH CORONARY ANGIOGRAM;  Surgeon: Lorretta Harp, MD;  Location: [facility];  Service: Cardiovascular;  Laterality: N/A;  . LEFT HEART CATHETERIZATION WITH CORONARY ANGIOGRAM N/A 12/31/2013   Procedure: LEFT HEART CATHETERIZATION WITH CORONARY ANGIOGRAM;  Surgeon: Wellington Hampshire, MD;  Location: [facility];  Service: Cardiovascular;  Laterality: N/A;    Current Medications: Outpatient Medications Prior to Visit  Medication Sig Dispense Refill  . amiodarone (PACERONE) 200 MG tablet TAKE 1 TABLET DAILY (Patient taking differently: Take 200 mg by mouth daily.) 30 tablet 3  . apixaban (ELIQUIS) 2.5 MG TABS tablet Take 1 tablet (2.5 mg total) by mouth 2 (two) times daily. 60 tablet 1  . carboxymethylcellulose (REFRESH PLUS) 0.5 % SOLN Place 1 drop into both eyes 3 (three) times daily as needed (dry/irritated eyes.).    Marland Kitchen Cholecalciferol (VITAMIN D) 50 MCG (2000 UT) CAPS Take 2,000 Units by mouth daily.     . diphenoxylate-atropine (LOMOTIL) 2.5-0.025 MG tablet TAKE 1 TABLET EVERY 8 HOURS AS NEEDED FOR DIARRHEA (Patient taking differently: Take 1 tablet by mouth every 8 (eight) hours as needed for diarrhea or loose stools.) 30 tablet 0  . feeding supplement (ENSURE ENLIVE / ENSURE PLUS) LIQD Take 237 mLs by mouth 2 (two) times daily between meals.    . hydrALAZINE (APRESOLINE) 10 MG  tablet TAKE 1 TABLET EVERY 8 HOURS (Patient taking differently: Take 10 mg by mouth every 8 (eight) hours.) 90 tablet 4  . isosorbide mononitrate (IMDUR) 30 MG 24 hr tablet TAKE ONE HALF (1/2) TABLET DAILY (Patient taking differently: Take 15 mg by mouth daily.) 90 tablet 2  . levothyroxine (SYNTHROID) 75 MCG tablet TAKE 1 TABLET DAILY BEFORE BREAKFAST (Patient taking differently: Take 75 mcg by mouth daily before breakfast.) 90 tablet 0  . loratadine (CLARITIN) 10 MG tablet Take 1 tablet (10 mg total) by mouth daily. (for itching) (Patient taking differently: Take 10 mg by mouth daily as needed for allergies or itching.) 30 tablet 11  . meclizine (ANTIVERT) 25 MG tablet Take 25 mg by mouth 3 (  three) times daily as needed for dizziness or nausea.     . metoprolol tartrate (LOPRESSOR) 25 MG tablet TAKE  (1)  TABLET TWICE A DAY. (Patient taking differently: Take 25 mg by mouth 2 (two) times daily.) 180 tablet 2  . Multiple Vitamins-Minerals (CENTRUM SILVER ULTRA WOMENS PO) Take 1 tablet by mouth daily.    . nitroGLYCERIN (NITROSTAT) 0.4 MG SL tablet Place 1 tablet (0.4 mg total) under the tongue every 5 (five) minutes as needed for chest pain. (Patient taking differently: Place 0.4 mg under the tongue every 5 (five) minutes as needed for chest pain (max 3 doses).) 25 tablet 2  . pantoprazole (PROTONIX) 40 MG tablet Take 1 tablet (40 mg total) by mouth every evening. (Patient taking differently: Take 40 mg by mouth daily.) 90 tablet 3  . PARoxetine (PAXIL) 20 MG tablet TAKE (1) TABLET DAILY IN THE MORNING. (Patient taking differently: Take 20 mg by mouth daily.) 90 tablet 0  . rosuvastatin (CRESTOR) 10 MG tablet Take 1 tablet (10 mg total) by mouth daily. 30 tablet 1  . torsemide (DEMADEX) 20 MG tablet Take 1 tablet (20 mg total) by mouth daily as needed (edema). 30 tablet 0   No facility-administered medications prior to visit.     Allergies:   Sotalol, Bactrim [sulfamethoxazole-trimethoprim],  Ciprofloxacin, Tramadol, Actos [pioglitazone], Adhesive [tape], Atorvastatin, Contrast media [iodinated diagnostic agents], Latex, Pedi-pre tape spray [wound dressing adhesive], and Sulfa antibiotics   Social History   Socioeconomic History  . Marital status: Widowed    Spouse name: Not on file  . Number of children: 4  . Years of education: 8  . Highest education level: 8th grade  Occupational History  . Occupation: Retired  Tobacco Use  . Smoking status: Never Smoker  . Smokeless tobacco: Never Used  Vaping Use  . Vaping Use: Never used  Substance and Sexual Activity  . Alcohol use: No  . Drug use: No  . Sexual activity: Never    Birth control/protection: Post-menopausal  Other Topics Concern  . Not on file  Social History Narrative  . Not on file   Social Determinants of Health   Financial Resource Strain: Low Risk   . Difficulty of Paying Living Expenses: Not hard at all  Food Insecurity: No Food Insecurity  . Worried About Charity fundraiser in the Last Year: Never true  . Ran Out of Food in the Last Year: Never true  Transportation Needs: No Transportation Needs  . Lack of Transportation (Medical): No  . Lack of Transportation (Non-Medical): No  Physical Activity: Inactive  . Days of Exercise per Week: 0 days  . Minutes of Exercise per Session: 0 min  Stress: No Stress Concern Present  . Feeling of Stress : Only a little  Social Connections: Moderately Integrated  . Frequency of Communication with Friends and Family: More than three times a week  . Frequency of Social Gatherings with Friends and Family: Once a week  . Attends Religious Services: More than 4 times per year  . Active Member of Clubs or Organizations: Yes  . Attends Archivist Meetings: More than 4 times per year  . Marital Status: Widowed     Family History:  The patient's family history includes Asthma in her brother; Atrial fibrillation in her sister; Cancer in her daughter; Colon  polyps in her daughter; Diabetes in her daughter; Heart attack in her brother and father; Heart disease in her brother; Hyperlipidemia in her sister; Macular degeneration  in her sister; Osteoporosis in her sister; Other in her father and mother; Stroke in her mother, sister, and sister; Uterine cancer in her sister.   ROS:   Please see the history of present illness.    ROS All other systems are reviewed and are negative.   PHYSICAL EXAM:   VS:  BP 132/64   Pulse (!) 50   Ht 5\' 3"  (1.6 m)   Wt 138 lb (62.6 kg)   SpO2 99%   BMI 24.45 kg/m       General: Alert, oriented x3, no distress, appears well.  Healthy left subclavian defibrillator site Head: no evidence of trauma, PERRL, EOMI, no exophtalmos or lid lag, no myxedema, no xanthelasma; normal ears, nose and oropharynx Neck: normal jugular venous pulsations and no hepatojugular reflux; brisk carotid pulses without delay and no carotid bruits Chest: clear to auscultation, no signs of consolidation by percussion or palpation, normal fremitus, symmetrical and full respiratory excursions Cardiovascular: normal position and quality of the apical impulse, regular rhythm, normal first and paradoxically split second heart sounds, no murmurs, rubs or gallops Abdomen: no tenderness or distention, no masses by palpation, no abnormal pulsatility or arterial bruits, normal bowel sounds, no hepatosplenomegaly Extremities: no clubbing, cyanosis or edema; 2+ radial, ulnar and brachial pulses bilaterally; 2+ right femoral, posterior tibial and dorsalis pedis pulses; 2+ left femoral, posterior tibial and dorsalis pedis pulses; no subclavian or femoral bruits Neurological: grossly nonfocal Psych: Normal mood and affect    Wt Readings from Last 3 Encounters:  01/12/20 138 lb (62.6 kg)  01/05/20 136 lb (61.7 kg)  12/26/19 133 lb 9.6 oz (60.6 kg)    Studies/Labs Reviewed:   EKG:  EKG is ordered today and shows nearly isorhythmic sinus bradycardia and  ventricular pacing, but the PR is very short and gradually shortness of the tracing.   Recent Labs: 11/14/2019: B Natriuretic Peptide 109.0 12/24/2019: Magnesium 2.2 01/05/2020: ALT 53; BUN 16; Creatinine, Ser 1.31; Hemoglobin 11.5; Platelets 223; Potassium 4.0; Sodium 137; TSH 7.000   Lipid Panel    Component Value Date/Time   CHOL 164 12/23/2019 2104   CHOL 139 10/23/2018 0947   CHOL 140 07/31/2012 1019   TRIG 61 12/23/2019 2104   TRIG 115 06/07/2015 1111   TRIG 161 (H) 07/31/2012 1019   HDL 66 12/23/2019 2104   HDL 53 10/23/2018 0947   HDL 68 06/07/2015 1111   HDL 43 07/31/2012 1019   CHOLHDL 2.5 12/23/2019 2104   VLDL 12 12/23/2019 2104   LDLCALC 86 12/23/2019 2104   LDLCALC 65 10/23/2018 0947   LDLCALC 65 09/09/2013 0841   LDLCALC 65 07/31/2012 1019     ASSESSMENT:    1. Paroxysmal atrial fibrillation (HCC)   2. Encounter for monitoring amiodarone therapy   3. Catecholaminergic polymorphic ventricular tachycardia ([place])   4. Long term (current) use of anticoagulants   5. Coronary artery disease involving native coronary artery of native heart with angina pectoris ([place])   6. Essential hypertension   7. Diabetes mellitus type 2, diet-controlled ([place])   8. Stage 3b chronic kidney disease ([place])   9. ICD (implantable cardioverter-defibrillator) in place   10. Hypercholesterolemia      PLAN:  In order of problems listed above:  1. AFib: This has often presented with symptoms of chest discomfort and shortness of breath, but the overall burden has decreased with amiodarone.  This led to her brief hospitalization earlier this month, without evidence of myocardial injury.  Continue amiodarone and  anticoagulation. CHADSVasc 6 (age 10, gender, DM, CAD, hypertension).   2. Amiodarone:  associated with iatrogenic hypothyroidism.  When we recheck her TSH in about 6 months we should check a free T4.  Reminded her about the potential for lung complications and the need to avoid  excessive sun exposure.  Yearly eye exam recommended. 3. VT: Very brief episode of polymorphic VT in April, otherwise no events this year.  She has not had any therapy from her defibrillator since the early 1990s.  She did have a rather lengthy 25 beat nonsustained VT run in September 2020 when we decreased her beta-blocker dose due to bradycardia.  Disopyramide was stopped several years ago due to complaints of dry mouth and orthostatic hypotension.   4. Eliquis: Dose adjusted for age and worsening renal function.  On December 9 the creatinine was as high as 1.69.  I think she should remain on the lower dose of anticoagulant. 5. CAD: Does not have angina with exertion, but often develops chest discomfort when she has atrial fibrillation.  She has not required revascularization since 2003.  Her last assessment was a cardiac catheterization 2015 that did not show any significant coronary stenoses. 6. HTN/Orthostatic hypotension: Acceptable blood pressure today.  7. DM: Well-controlled with diet, no medications; recent hemoglobin A1c 6.6 % in July 8. CKD 3b: Baseline creatinine seems to be in the 1.3-1.4 range, corresponding to a GFR less than 40, transient deterioration during her recent hospitalization to a creatinine of 1.69 (GFR 29). 9. ICD: Normal single-chamber device function.  Does not have an atrial lead.  Continue remote downloads every 3 months.. 10. HLP: LDL recently a little high although she has not gained weight.  Suspect occasionally forgets to take her statin.  Triglycerides are normal.    Medication Adjustments/Labs and Tests Ordered: Current medicines are reviewed at length with the patient today.  Concerns regarding medicines are outlined above.  Medication changes, Labs and Tests ordered today are listed in the Patient Instructions below. Patient Instructions  Medication Instructions:  No changes *If you need a refill on your cardiac medications before your next appointment,  please call your pharmacy*   Lab Work: Your provider would like for you to return in March to have the following labs drawn: CMET and TSH. You do not need an appointment for the lab. Once in our office lobby there is a podium where you can sign in and ring the doorbell to alert [place] that you are here. The lab is open from 8:00 am to 4:30 pm; closed for lunch from 12:45pm-1:45pm.  If you have labs (blood work) drawn today and your tests are completely normal, you will receive your results only by: Marland Kitchen MyChart Message (if you have MyChart) OR . A paper copy in the mail If you have any lab test that is abnormal or we need to change your treatment, we will call you to review the results.   Testing/Procedures: None ordered   Follow-Up: At [facility], you and your health needs are our priority.  As part of our continuing mission to provide you with exceptional heart care, we have created designated Provider Care Teams.  These Care Teams include your primary Cardiologist (physician) and Advanced Practice Providers (APPs -  Physician Assistants and Nurse Practitioners) who all work together to provide you with the care you need, when you need it.  We recommend signing up for the patient portal called "MyChart".  Sign up information is provided on this After Visit  Summary.  MyChart is used to connect with patients for Virtual Visits (Telemedicine).  Patients are able to view lab/test results, encounter notes, upcoming appointments, etc.  Non-urgent messages can be sent to your provider as well.   To learn more about what you can do with MyChart, go to [email or web address].    Your next appointment:   6 month(s)  The format for your next appointment:   In Person  Provider:   You may see Sanda Klein, MD or one of the following Advanced Practice Providers on your designated Care Team:    Almyra Deforest, PA-C  Fabian Sharp, [place] or   Roby Lofts, PA-C         Signed, Sanda Klein,  MD  01/12/2020 1:25 PM    [facility] [place], [place], [place]  [address] Phone: [phone]; Fax: [phone]

## 2020-01-12 NOTE — Patient Instructions (Signed)
Medication Instructions:  No changes *If you need a refill on your cardiac medications before your next appointment, please call your pharmacy*   Lab Work: Your provider would like for you to return in March to have the following labs drawn: CMET and TSH. You do not need an appointment for the lab. Once in our office lobby there is a podium where you can sign in and ring the doorbell to alert [place] that you are here. The lab is open from 8:00 am to 4:30 pm; closed for lunch from 12:45pm-1:45pm.  If you have labs (blood work) drawn today and your tests are completely normal, you will receive your results only by:  [place] (if you have MyChart) OR  A paper copy in the mail If you have any lab test that is abnormal or we need to change your treatment, we will call you to review the results.   Testing/Procedures: None ordered   Follow-Up: At [facility], you and your health needs are our priority.  As part of our continuing mission to provide you with exceptional heart care, we have created designated Provider Care Teams.  These Care Teams include your primary Cardiologist (physician) and Advanced Practice Providers (APPs -  Physician Assistants and Nurse Practitioners) who all work together to provide you with the care you need, when you need it.  We recommend signing up for the patient portal called "MyChart".  Sign up information is provided on this After Visit Summary.  MyChart is used to connect with patients for Virtual Visits (Telemedicine).  Patients are able to view lab/test results, encounter notes, upcoming appointments, etc.  Non-urgent messages can be sent to your provider as well.   To learn more about what you can do with MyChart, go to [email or web address].    Your next appointment:   6 month(s)  The format for your next appointment:   In Person  Provider:   You may see Sanda Klein, MD or one of the following Advanced Practice Providers on your designated  Care Team:    Almyra Deforest, PA-C  Fabian Sharp, PA-C or   Roby Lofts, [place]

## 2020-01-15 ENCOUNTER — Ambulatory Visit (INDEPENDENT_AMBULATORY_CARE_PROVIDER_SITE_OTHER): Payer: Medicare Other | Admitting: Internal Medicine

## 2020-01-15 ENCOUNTER — Other Ambulatory Visit: Payer: Self-pay

## 2020-01-15 ENCOUNTER — Encounter: Payer: Self-pay | Admitting: Internal Medicine

## 2020-01-15 VITALS — BP 115/61 | HR 50 | Temp 96.9°F | Ht 63.0 in | Wt 137.0 lb

## 2020-01-15 DIAGNOSIS — K219 Gastro-esophageal reflux disease without esophagitis: Secondary | ICD-10-CM | POA: Diagnosis not present

## 2020-01-15 DIAGNOSIS — R109 Unspecified abdominal pain: Secondary | ICD-10-CM | POA: Diagnosis not present

## 2020-01-15 DIAGNOSIS — K580 Irritable bowel syndrome with diarrhea: Secondary | ICD-10-CM | POA: Diagnosis not present

## 2020-01-15 NOTE — Patient Instructions (Addendum)
Continue on Protonix.  Take Tylenol as needed for breakthrough symptoms of abdominal pain.  We will avoid stronger pain medications to reduce risk of confusion.  I will print out a low FODMAP diet which may identify triggers that worsen your symptoms.  Follow-up in 6 months or sooner if needed.  At [facility] we value your feedback. You may receive a survey about your visit today. Please share your experience as we strive to create trusting relationships with our patients to provide genuine, compassionate, quality care.  We appreciate your understanding and patience as we review any laboratory studies, imaging, and other diagnostic tests that are ordered as we care for you. Our office policy is 5 business days for review of these results, and any emergent or urgent results are addressed in a timely manner for your best interest. If you do not hear from our office in 1 week, please contact us.   We also encourage the use of MyChart, which contains your medical information for your review as well. If you are not enrolled in this feature, an access code is on this after visit summary for your convenience. Thank you for allowing [place] to be involved in your care.  It was great to see you today!  I hope you have a great rest of your winter!!    Elon Alas. Abbey Chatters, D.O. Gastroenterology and Hepatology [facility]

## 2020-01-15 NOTE — Progress Notes (Signed)
Referring Provider: [name], DO Primary Care Physician:  [name], DO Primary GI:  Dr. Abbey Chatters  Chief Complaint  Patient presents with  . Diarrhea    Has been normal    HPI:   [name] is a 86 y.o. female who presents to the clinic today for follow-up visit.  She has somewhat of a complicated GI history.  She has chronic abdominal pain which has been worked up extensively.  She has undergone ultrasound, CT, CTA, EGD.  She is currently on Protonix for chronic reflux which is well controlled.  She has been tried on amitriptyline and tramadol though she states she is not taking either 1 of these.  Appears that she was on dicyclomine at one point as well.  She is not very savvy on the medication she is tried or the ones that helped or did not help.  She currently takes Tylenol for breakthrough symptoms.  Which she states is helping.  She states this week has been the best week she has had in a long time.  Has not had any abdominal pain.  Her daughter-in-law states this is due to patient seen her whole family for Christmas which made her happy.  Patient does note being a homebody primarily due to Covid.  She does miss going out and running errands and seeing friends/family.  She is chronically on Paxil.  Past Medical History:  Diagnosis Date  . AICD (automatic cardioverter/defibrillator) present   . Allergy    SESONAL  . Anxiety   . ARTHRITIS   . Arthritis   . Atrial fibrillation ([place])   . CAD (coronary artery disease)    a. history of cardiac arrest 1993;  b. s/p LAD/LCX stenting in 2003;  c. 07/2011 Cath: LM nl, LAD patent stent, D1 80ost, LCX patent stent, RCA min irregs;  d. 04/2012 MV: EF 66%, no ishcemia;  e. 12/2013 Echo: Ef 55%, no rwma, Gr 1 DD, triv AI/MR, mildly dil LA;  f. 12/2013 Lexi MV: intermediate risk - apical ischemia and inf/infsept fixed defect, ? artifactual.  . CAD in native artery 12/31/2013   Previous stents to LAD and Ramus patent on  cath today 12/31/13 also There is severe disease in the ostial first diagonal which is unchanged from most recent cardiac catheterization. The right coronary artery could not be engaged selectively but nonselective angiography showed no significant disease in the proximal and midsegment.     . Cataract    DENIES  . Chronic back pain   . Chronic sinus bradycardia   . CKD (chronic kidney disease), stage III ([place])   . Clotting disorder (H[facility])    DVT  . COLITIS 12/02/2007   Qualifier: Diagnosis of  By: Nils Pyle CMA ([place]), [name]    . Diabetes mellitus without complication ([place])    DENIES  . Diverticulosis of colon (without mention of hemorrhage) 2007   Colonoscopy   . Esophageal stricture    a. 2012 s/p dil.  . Esophagitis, unspecified    a. 2012 EGD  . EXTERNAL HEMORRHOIDS   . GERD (gastroesophageal reflux disease)    omeprazole  . H/O hiatal hernia   . Hiatal hernia    a. 2012 EGD.  Marland Kitchen History of DVT in the past, not on Coumadin now    left  leg  . HYPERCHOLESTEROLEMIA   . Hypertension   . ICD (implantable cardiac defibrillator) in place    a. s/p initial ICD in 1993 in setting of  cardiac arrest;  b. 01/2007 gen change: Guidant T135 Vitality DS VR single lead ICD.  . Macular degeneration    gets injecton in eye every 5 weeks- last injection - 05/03/2013   . Myocardial infarction ([place]) 1993  . Near syncope 05/22/2015  . Nephrolithiasis, just saw Dr [name]   . Orthostatic hypotension   . Peripheral vascular disease ([place])    ???  . RECTAL BLEEDING 12/03/2007   Qualifier: Diagnosis of  By: Sharlett Iles MD Byrd Hesselbach   . Unspecified gastritis and gastroduodenitis without mention of hemorrhage    a. 2003 EGD->not noted on 2012 EGD.  Marland Kitchen Unstable angina, neg MI, cath stable.maybe GI 12/30/2013    Past Surgical History:  Procedure Laterality Date  . BREAST BIOPSY Left 11/2013  . CARDIAC CATHETERIZATION    . CARDIAC CATHETERIZATION  01/01/15   patent stents -there is severe  disease in ostial 1st diag but without change.   Marland Kitchen CARDIAC DEFIBRILLATOR PLACEMENT  02/05/2007   Guidant  . CARDIOVASCULAR STRESS TEST  12/30/2013   abnormal  . CARDIOVERSION N/A 02/13/2019   Procedure: CARDIOVERSION;  Surgeon: Sanda Klein, MD;  Location: [place];  Service: Cardiovascular;  Laterality: N/A;  . CATARACT EXTRACTION Right    Dr. Katy Fitch  . COLONOSCOPY    . coronary stents     . CYSTOSCOPY WITH URETEROSCOPY Right 05/20/2013   Procedure: CYSTOSCOPY, RIGHT URETEROSCOPY STONE EXTRACTION, Insertion of right DOUBLE J STENT ;  Surgeon: Irine Seal, MD;  Location: [facility];  Service: Urology;  Laterality: Right;  . CYSTOSCOPY WITH URETEROSCOPY AND STENT PLACEMENT N/A 06/03/2013   Procedure: SECOND LOOK CYSTOSCOPY WITH URETEROSCOPY  HOLMIUM LASER LITHO AND STONE EXTRACTION Sammie Bench ;  Surgeon: Malka So, MD;  Location: [facility];  Service: Urology;  Laterality: N/A;  . ESOPHAGOGASTRODUODENOSCOPY (EGD) WITH PROPOFOL N/A 11/20/2019   Procedure: ESOPHAGOGASTRODUODENOSCOPY (EGD) WITH PROPOFOL;  Surgeon: Daneil Dolin, MD;  [facility];  Service: Endoscopy;  Laterality: N/A;  . GIVENS CAPSULE STUDY N/A 10/30/2016   Procedure: GIVENS CAPSULE STUDY;  Surgeon: Irene Shipper, MD;  Location: [place];  Service: Endoscopy;  Laterality: N/A;  NEEDS TO BE ADMITTED FOR OBSERVATION PT. HAS DEFIB  . HEMORROIDECTOMY  80's  . HOLMIUM LASER APPLICATION Right 03/22/4678   Procedure: HOLMIUM LASER APPLICATION;  Surgeon: Irine Seal, MD;  Location: [facility];  Service: Urology;  Laterality: Right;  . HYSTEROSCOPY WITH D & C  09/02/2010   Procedure: DILATATION AND CURETTAGE (D&C) /HYSTEROSCOPY;  Surgeon: Margarette Asal;  Location: [facility];  Service: Gynecology;  Laterality: N/A;  Dilation and Curettage with Hysteroscopy and Polypectomy  . IMPLANTABLE CARDIOVERTER DEFIBRILLATOR (ICD) GENERATOR CHANGE N/A 02/10/2014   Procedure: ICD GENERATOR CHANGE;  Surgeon: Sanda Klein, MD;  Location: [place] [facility];  Service: Cardiovascular;  Laterality: N/A;  . LEFT HEART CATHETERIZATION WITH CORONARY ANGIOGRAM N/A 07/28/2011   Procedure: LEFT HEART CATHETERIZATION WITH CORONARY ANGIOGRAM;  Surgeon: Lorretta Harp, MD;  Location: [facility];  Service: Cardiovascular;  Laterality: N/A;  . LEFT HEART CATHETERIZATION WITH CORONARY ANGIOGRAM N/A 12/31/2013   Procedure: LEFT HEART CATHETERIZATION WITH CORONARY ANGIOGRAM;  Surgeon: Wellington Hampshire, MD;  Location: [place] CATH LAB;  Service: Cardiovascular;  Laterality: N/A;    Current Outpatient Medications  Medication Sig Dispense Refill  . amiodarone (PACERONE) 200 MG tablet TAKE 1 TABLET DAILY (Patient taking differently: Take 200 mg by mouth daily.) 30 tablet 3  . apixaban (ELIQUIS) 2.5 MG TABS tablet  Take 1 tablet (2.5 mg total) by mouth 2 (two) times daily. 60 tablet 1  . carboxymethylcellulose (REFRESH PLUS) 0.5 % SOLN Place 1 drop into both eyes 3 (three) times daily as needed (dry/irritated eyes.).    Marland Kitchen Cholecalciferol (VITAMIN D) 50 MCG (2000 UT) CAPS Take 2,000 Units by mouth daily.     . diphenoxylate-atropine (LOMOTIL) 2.5-0.025 MG tablet TAKE 1 TABLET EVERY 8 HOURS AS NEEDED FOR DIARRHEA (Patient taking differently: Take 1 tablet by mouth every 8 (eight) hours as needed for diarrhea or loose stools.) 30 tablet 0  . feeding supplement (ENSURE ENLIVE / ENSURE PLUS) LIQD Take 237 mLs by mouth 2 (two) times daily between meals.    . hydrALAZINE (APRESOLINE) 10 MG tablet TAKE 1 TABLET EVERY 8 HOURS (Patient taking differently: Take 10 mg by mouth every 8 (eight) hours.) 90 tablet 4  . isosorbide mononitrate (IMDUR) 30 MG 24 hr tablet TAKE ONE HALF (1/2) TABLET DAILY (Patient taking differently: Take 15 mg by mouth daily.) 90 tablet 2  . levothyroxine (SYNTHROID) 75 MCG tablet TAKE 1 TABLET DAILY BEFORE BREAKFAST (Patient taking differently: Take 75 mcg by mouth daily before breakfast.) 90 tablet 0  . loratadine (CLARITIN) 10 MG tablet Take 1 tablet (10  mg total) by mouth daily. (for itching) (Patient taking differently: Take 10 mg by mouth daily as needed for allergies or itching.) 30 tablet 11  . meclizine (ANTIVERT) 25 MG tablet Take 25 mg by mouth 3 (three) times daily as needed for dizziness or nausea.     . metoprolol tartrate (LOPRESSOR) 25 MG tablet TAKE  (1)  TABLET TWICE A DAY. (Patient taking differently: Take 25 mg by mouth as needed.) 180 tablet 2  . Multiple Vitamins-Minerals (CENTRUM SILVER ULTRA WOMENS PO) Take 1 tablet by mouth daily.    . nitroGLYCERIN (NITROSTAT) 0.4 MG SL tablet Place 1 tablet (0.4 mg total) under the tongue every 5 (five) minutes as needed for chest pain. (Patient taking differently: Place 0.4 mg under the tongue every 5 (five) minutes as needed for chest pain (max 3 doses).) 25 tablet 2  . pantoprazole (PROTONIX) 40 MG tablet Take 1 tablet (40 mg total) by mouth every evening. (Patient taking differently: Take 40 mg by mouth daily.) 90 tablet 3  . PARoxetine (PAXIL) 20 MG tablet TAKE (1) TABLET DAILY IN THE MORNING. (Patient taking differently: Take 20 mg by mouth daily.) 90 tablet 0  . rosuvastatin (CRESTOR) 10 MG tablet Take 1 tablet (10 mg total) by mouth daily. 30 tablet 1  . torsemide (DEMADEX) 20 MG tablet Take 1 tablet (20 mg total) by mouth daily as needed (edema). 30 tablet 0   No current facility-administered medications for this visit.    Allergies as of 01/15/2020 - Review Complete 01/15/2020  Allergen Reaction Noted  . Sotalol Other (See Comments) 11/01/2016  . Bactrim [sulfamethoxazole-trimethoprim]  08/19/2016  . Ciprofloxacin Nausea Only 02/05/2018  . Tramadol Other (See Comments) 12/23/2019  . Actos [pioglitazone] Swelling 07/31/2012  . Adhesive [tape] Other (See Comments) 02/20/2014  . Atorvastatin Other (See Comments) 07/31/2012  . Contrast media [iodinated diagnostic agents] Other (See Comments) 07/25/2011  . Latex Rash 05/29/2013  . Pedi-pre tape spray [wound dressing adhesive]  Other (See Comments) 02/20/2014  . Sulfa antibiotics Other (See Comments) 02/20/2014    Family History  Problem Relation Age of Onset  . Stroke Mother   . Other Mother        brain tumor  . Other Father  MI  . Heart attack Father   . Stroke Sister   . Macular degeneration Sister   . Diabetes Daughter   . Cancer Daughter        ovarian  . Colon polyps Daughter   . Atrial fibrillation Sister   . Hyperlipidemia Sister   . Osteoporosis Sister   . Stroke Sister   . Uterine cancer Sister   . Heart attack Brother   . Heart disease Brother   . Asthma Brother   . Breast cancer Neg Hx     Social History   Socioeconomic History  . Marital status: Widowed    Spouse name: Not on file  . Number of children: 4  . Years of education: 8  . Highest education level: 8th grade  Occupational History  . Occupation: Retired  Tobacco Use  . Smoking status: Never Smoker  . Smokeless tobacco: Never Used  Vaping Use  . Vaping Use: Never used  Substance and Sexual Activity  . Alcohol use: No  . Drug use: No  . Sexual activity: Never    Birth control/protection: Post-menopausal  Other Topics Concern  . Not on file  Social History Narrative  . Not on file   Social Determinants of Health   Financial Resource Strain: Low Risk   . Difficulty of Paying Living Expenses: Not hard at all  Food Insecurity: No Food Insecurity  . Worried About Charity fundraiser in the Last Year: Never true  . Ran Out of Food in the Last Year: Never true  Transportation Needs: No Transportation Needs  . Lack of Transportation (Medical): No  . Lack of Transportation (Non-Medical): No  Physical Activity: Inactive  . Days of Exercise per Week: 0 days  . Minutes of Exercise per Session: 0 min  Stress: No Stress Concern Present  . Feeling of Stress : Only a little  Social Connections: Moderately Integrated  . Frequency of Communication with Friends and Family: More than three times a week  .  Frequency of Social Gatherings with Friends and Family: Once a week  . Attends Religious Services: More than 4 times per year  . Active Member of Clubs or Organizations: Yes  . Attends Archivist Meetings: More than 4 times per year  . Marital Status: Widowed    Subjective: Review of Systems  Constitutional: Negative for chills and fever.  HENT: Negative for congestion and hearing loss.   Eyes: Negative for blurred vision and double vision.  Respiratory: Negative for cough and shortness of breath.   Cardiovascular: Negative for chest pain and palpitations.  Gastrointestinal: Positive for abdominal pain. Negative for blood in stool, constipation, diarrhea, heartburn, melena and vomiting.  Genitourinary: Negative for dysuria and urgency.  Musculoskeletal: Negative for joint pain and myalgias.  Skin: Negative for itching and rash.  Neurological: Negative for dizziness and headaches.  Psychiatric/Behavioral: Negative for depression. The patient is not nervous/anxious.      Objective: BP 115/61   Pulse (!) 50   Temp (!) 96.9 F (36.1 C) (Temporal)   Ht 5\' 3"  (1.6 m)   Wt 137 lb (62.1 kg)   BMI 24.27 kg/m  Physical Exam Constitutional:      Appearance: Normal appearance.  HENT:     Head: Normocephalic and atraumatic.  Eyes:     Extraocular Movements: Extraocular movements intact.     Conjunctiva/sclera: Conjunctivae normal.  Cardiovascular:     Rate and Rhythm: Normal rate and regular rhythm.  Pulmonary:  Effort: Pulmonary effort is normal.     Breath sounds: Normal breath sounds.  Abdominal:     General: Bowel sounds are normal.     Palpations: Abdomen is soft.  Musculoskeletal:        General: No swelling. Normal range of motion.     Cervical back: Normal range of motion and neck supple.  Skin:    General: Skin is warm and dry.     Coloration: Skin is not jaundiced.  Neurological:     General: No focal deficit present.     Mental Status: She is alert  and oriented to person, place, and time.  Psychiatric:        Mood and Affect: Mood normal.        Behavior: Behavior normal.      Assessment: *Abdominal pain-chronic, improved *Irritable bowel syndrome *Diarrhea  Plan: Patient appears to be doing well today.  Discussed irritable bowel syndrome in depth with her.  It is unclear to me which medication she is tolerated/helped versus those have not.  She is unclear if the amitriptyline helped or not.  She also is unclear if the tramadol helped or not.  She states one of them caused her to "go crazy" at night.  Both have been discontinued at this time.  She has had extensive GI work-up thus far.  Continue on Protonix daily as this does seem to be helping.  Recommended that she continue to take Tylenol as needed for breakthrough symptoms.  She does note that she has been depressed as of late as she is basically at home all the time due to Covid.  Discussed CTA in depth with her today.  She was unaware about the shrunken left kidney and left renal artery stenosis.  Recommend she discuss this with her nephrologist.  Patient follow-up in 6 months or sooner if needed.  01/15/2020 4:18 PM   Disclaimer: This note was dictated with voice recognition software. Similar sounding words can inadvertently be transcribed and may not be corrected upon review.

## 2020-01-21 ENCOUNTER — Ambulatory Visit (INDEPENDENT_AMBULATORY_CARE_PROVIDER_SITE_OTHER): Payer: Medicare Other

## 2020-01-21 DIAGNOSIS — I472 Ventricular tachycardia, unspecified: Secondary | ICD-10-CM

## 2020-01-21 LAB — CUP PACEART REMOTE DEVICE CHECK
Battery Remaining Longevity: 132 mo
Battery Remaining Percentage: 100 %
Brady Statistic RV Percent Paced: 9 %
Date Time Interrogation Session: 20220105083000
HighPow Impedance: 37 Ohm
Implantable Lead Implant Date: 20090120
Implantable Lead Location: 753860
Implantable Lead Model: 157
Implantable Lead Serial Number: 138237
Implantable Pulse Generator Implant Date: 20160126
Lead Channel Impedance Value: 503 Ohm
Lead Channel Pacing Threshold Amplitude: 0.8 V
Lead Channel Pacing Threshold Pulse Width: 0.5 ms
Lead Channel Setting Pacing Amplitude: 2.2 V
Lead Channel Setting Pacing Pulse Width: 0.5 ms
Lead Channel Setting Sensing Sensitivity: 0.6 mV
Pulse Gen Serial Number: 191956

## 2020-01-21 MED ORDER — LEVOTHYROXINE SODIUM 88 MCG PO TABS: 88.0000 ug | ORAL_TABLET | Freq: Every day | ORAL | 0 refills | Status: DC

## 2020-01-21 NOTE — Addendum Note (Signed)
Addended by: Karle Plumber on: 01/21/2020 03:28 PM   Modules accepted: Orders

## 2020-01-26 ENCOUNTER — Telehealth: Payer: Self-pay | Admitting: *Deleted

## 2020-01-26 ENCOUNTER — Encounter: Payer: Self-pay | Admitting: Family Medicine

## 2020-01-26 ENCOUNTER — Ambulatory Visit (INDEPENDENT_AMBULATORY_CARE_PROVIDER_SITE_OTHER): Payer: Medicare Other | Admitting: Family Medicine

## 2020-01-26 ENCOUNTER — Other Ambulatory Visit: Payer: Self-pay

## 2020-01-26 VITALS — BP 157/64 | HR 45 | Temp 97.4°F | Resp 20 | Ht 63.0 in | Wt 132.0 lb

## 2020-01-26 DIAGNOSIS — R42 Dizziness and giddiness: Secondary | ICD-10-CM

## 2020-01-26 DIAGNOSIS — R001 Bradycardia, unspecified: Secondary | ICD-10-CM

## 2020-01-26 DIAGNOSIS — M1711 Unilateral primary osteoarthritis, right knee: Secondary | ICD-10-CM

## 2020-01-26 DIAGNOSIS — N200 Calculus of kidney: Secondary | ICD-10-CM | POA: Diagnosis not present

## 2020-01-26 DIAGNOSIS — K580 Irritable bowel syndrome with diarrhea: Secondary | ICD-10-CM

## 2020-01-26 DIAGNOSIS — H6122 Impacted cerumen, left ear: Secondary | ICD-10-CM | POA: Diagnosis not present

## 2020-01-26 NOTE — Telephone Encounter (Signed)
Attempted to reach the patient to bring her in for an appointment with Dr. Sallyanne Kuster. Appointment help for 1/12 at 8 am.

## 2020-01-26 NOTE — Telephone Encounter (Signed)
Spoke with the patient. Appointment made for 1/11 with Dr. Sallyanne Kuster.

## 2020-01-26 NOTE — Patient Instructions (Signed)
Call Dr C ASAP  Knee Injection A knee injection is a procedure to get medicine into your knee joint to relieve the pain, swelling, and stiffness of arthritis. Your health care provider uses a needle to inject medicine, which may also help to lubricate and cushion your knee joint. You may need more than one injection. Tell a health care provider about:  Any allergies you have.  All medicines you are taking, including vitamins, herbs, eye drops, creams, and over-the-counter medicines.  Any problems you or family members have had with anesthetic medicines.  Any blood disorders you have.  Any surgeries you have had.  Any medical conditions you have.  Whether you are pregnant or may be pregnant. What are the risks? Generally, this is a safe procedure. However, problems may occur, including:  Infection.  Bleeding.  Symptoms that get worse.  Damage to the area around your knee.  Allergic reaction to any of the medicines.  Skin reactions from repeated injections. What happens before the procedure?  Ask your health care provider about: ? Changing or stopping your regular medicines. This is especially important if you are taking diabetes medicines or blood thinners. ? Taking medicines such as aspirin and ibuprofen. These medicines can thin your blood. Do not take these medicines unless your health care provider tells you to take them. ? Taking over-the-counter medicines, vitamins, herbs, and supplements.  Plan to have a responsible adult take you home from the hospital or clinic. What happens during the procedure?  You will sit or lie down in a position for your knee to be treated.  The skin over your kneecap will be cleaned with a germ-killing soap.  You will be given a medicine that numbs the area (local anesthetic). You may feel some stinging.  The medicine will be injected into your knee. The needle is carefully placed between your kneecap and your knee. The medicine is  injected into the joint space.  The needle will be removed at the end of the procedure.  A bandage (dressing) may be placed over the injection site. The procedure may vary among health care providers and hospitals.   What can I expect after the procedure?  Your blood pressure, heart rate, breathing rate, and blood oxygen level will be monitored until you leave the hospital or clinic.  You may have to move your knee through its full range of motion. This helps to get all the medicine into your joint space.  You will be watched to make sure that you do not have a reaction to the injected medicine.  You may feel more pain, swelling, and warmth than you did before the injection. This reaction may last about 1-2 days. Follow these instructions at home: Medicines  Take over-the-counter and prescription medicines only as told by your health care provider.  Ask your health care provider if the medicine prescribed to you requires you to avoid driving or using machinery.  Do not take medicines such as aspirin and ibuprofen unless your health care provider tells you to take them. Injection site care  Follow instructions from your health care provider about: ? How to take care of your puncture site. ? When and how you should change your dressing. ? When you should remove your dressing.  Check your injection area every day for signs of infection. Check for: ? More redness, swelling, or pain after 2 days. ? Fluid or blood. ? Pus or a bad smell. ? Warmth. Managing pain, stiffness, and swelling  If directed, put ice on the injection area. To do this: ? Put ice in a plastic bag. ? Place a towel between your skin and the bag. ? Leave the ice on for 20 minutes, 2-3 times per day. ? Remove the ice if your skin turns bright red. This is very important. If you cannot feel pain, heat, or cold, you have a greater risk of damage to the area.  Do not apply heat to your knee.  Raise (elevate) the  injection area above the level of your heart while you are sitting or lying down.   General instructions  If you were given a dressing, keep it dry until your health care provider says it can be removed. Ask your health care provider when you can start showering or bathing.  Avoid strenuous activities for as long as directed by your health care provider. Ask your health care provider when you can return to your normal activities.  Keep all follow-up visits. This is important. You may need more injections. Contact a health care provider if you have:  A fever.  Warmth in your injection area.  Fluid, blood, or pus coming from your injection site.  Symptoms at your injection site that last longer than 2 days after your procedure. Get help right away if:  Your knee turns very red.  Your knee becomes very swollen.  Your knee is in severe pain. Summary  A knee injection is a procedure to get medicine into your knee joint to relieve the pain, swelling, and stiffness of arthritis.  A needle is carefully placed between your kneecap and your knee to inject medicine into the joint space.  Before the procedure, ask your health care provider about changing or stopping your regular medicines, especially if you are taking diabetes medicines or blood thinners.  Contact your health care provider if you have any problems or questions after your procedure. This information is not intended to replace advice given to you by your health care provider. Make sure you discuss any questions you have with your health care provider. Document Revised: 06/18/2019 Document Reviewed: 06/18/2019 Elsevier Patient Education  2021 [employer].

## 2020-01-26 NOTE — Progress Notes (Signed)
Subjective: CC: knee arthritis PCP: Renee Norlander, DO [name] is a 87 y.o. female presenting to clinic today for:  1.  Osteoarthritis of right knee, chronic Patient with known chronic arthritis of the right knee.  Last corticosteroid injection was September 2021.  She is here for repeat injection.   2.  Dizziness Patient reports intermittent dizzy spells that been ongoing for the last couple of weeks.  She notes that she had her pacemaker adjusted a couple of weeks ago and it is not supposed to follow-up with her cardiologist for another few months.  Denies any loss of consciousness.  She is compliant with all of her medications.  She wonders if she is in A. fib.  She took a nitro prior to arrival because she had a fluttering sensation in her chest.  Denies overt chest pain, shortness of breath.  Her blood pressure regimen was recently adjusted by Dr. Theador [name] to 25 mg of hydralazine 3 times daily but they have not actually started this regimen yet because they wanted to discuss this with me first.  3. irritable bowel syndrome with diarrhea Patient with ongoing irritable bowel syndrome with diarrhea.  She admits that much of the things that she likes to eat are things that she is not supposed to eat.  For this reason she continues to have intermittent episodes of cramping and diarrhea.   ROS: Per HPI  Allergies  Allergen Reactions  . Sotalol Other (See Comments)    Torsades   . Bactrim [Sulfamethoxazole-Trimethoprim]     Itchy rash  . Ciprofloxacin Nausea Only  . Tramadol Other (See Comments)    Hallucination, bad-dream  . Actos [Pioglitazone] Swelling  . Adhesive [Tape] Other (See Comments)    Causes sores  . Atorvastatin Other (See Comments)    myalgia  . Contrast Media [Iodinated Diagnostic Agents] Other (See Comments)    Headache (no action/pre-med required)  . Latex Rash  . Pedi-Pre Tape Spray [Wound Dressing Adhesive] Other (See Comments)    Causes  sores  . Sulfa Antibiotics Other (See Comments)    Causes sores   Past Medical History:  Diagnosis Date  . AICD (automatic cardioverter/defibrillator) present   . Allergy    SESONAL  . Anxiety   . ARTHRITIS   . Arthritis   . Atrial fibrillation ([place])   . CAD (coronary artery disease)    a. history of cardiac arrest 1993;  b. s/p LAD/LCX stenting in 2003;  c. 07/2011 Cath: LM nl, LAD patent stent, D1 80ost, LCX patent stent, RCA min irregs;  d. 04/2012 MV: EF 66%, no ishcemia;  e. 12/2013 Echo: Ef 55%, no rwma, Gr 1 DD, triv AI/MR, mildly dil LA;  f. 12/2013 Lexi MV: intermediate risk - apical ischemia and inf/infsept fixed defect, ? artifactual.  . CAD in native artery 12/31/2013   Previous stents to LAD and Ramus patent on cath today 12/31/13 also There is severe disease in the ostial first diagonal which is unchanged from most recent cardiac catheterization. The right coronary artery could not be engaged selectively but nonselective angiography showed no significant disease in the proximal and midsegment.     . Cataract    DENIES  . Chronic back pain   . Chronic sinus bradycardia   . CKD (chronic kidney disease), stage III ([place])   . Clotting disorder ([facility])    DVT  . COLITIS 12/02/2007   Qualifier: Diagnosis of  By: [name] CMA ([place]), [name]    .  Diabetes mellitus without complication ([place])    DENIES  . Diverticulosis of colon (without mention of hemorrhage) 2007   Colonoscopy   . Esophageal stricture    a. 2012 s/p dil.  . Esophagitis, unspecified    a. 2012 EGD  . EXTERNAL HEMORRHOIDS   . GERD (gastroesophageal reflux disease)    omeprazole  . H/O hiatal hernia   . Hiatal hernia    a. 2012 EGD.  Marland Kitchen History of DVT in the past, not on Coumadin now    left  leg  . HYPERCHOLESTEROLEMIA   . Hypertension   . ICD (implantable cardiac defibrillator) in place    a. s/p initial ICD in 1993 in setting of cardiac arrest;  b. 01/2007 gen change: Guidant T135 Vitality DS VR single lead  ICD.  . Macular degeneration    gets injecton in eye every 5 weeks- last injection - 05/03/2013   . Myocardial infarction ([place]) 1993  . Near syncope 05/22/2015  . Nephrolithiasis, just saw Dr [name]- "OK"   . Orthostatic hypotension   . Peripheral vascular disease ([place])    ???  . RECTAL BLEEDING 12/03/2007   Qualifier: Diagnosis of  By: Sharlett Iles MD [name]   . Unspecified gastritis and gastroduodenitis without mention of hemorrhage    a. 2003 EGD->not noted on 2012 EGD.  Marland Kitchen Unstable angina, neg MI, cath stable.maybe GI 12/30/2013    Current Outpatient Medications:  .  amiodarone (PACERONE) 200 MG tablet, TAKE 1 TABLET DAILY (Patient taking differently: Take 200 mg by mouth daily.), Disp: 30 tablet, Rfl: 3 .  apixaban (ELIQUIS) 2.5 MG TABS tablet, Take 1 tablet (2.5 mg total) by mouth 2 (two) times daily., Disp: 60 tablet, Rfl: 1 .  carboxymethylcellulose (REFRESH PLUS) 0.5 % SOLN, Place 1 drop into both eyes 3 (three) times daily as needed (dry/irritated eyes.)., Disp: , Rfl:  .  Cholecalciferol (VITAMIN D) 50 MCG (2000 UT) CAPS, Take 2,000 Units by mouth daily. , Disp: , Rfl:  .  diphenoxylate-atropine (LOMOTIL) 2.5-0.025 MG tablet, TAKE 1 TABLET EVERY 8 HOURS AS NEEDED FOR DIARRHEA (Patient taking differently: Take 1 tablet by mouth every 8 (eight) hours as needed for diarrhea or loose stools.), Disp: 30 tablet, Rfl: 0 .  feeding supplement (ENSURE ENLIVE / ENSURE PLUS) LIQD, Take 237 mLs by mouth 2 (two) times daily between meals., Disp: , Rfl:  .  hydrALAZINE (APRESOLINE) 10 MG tablet, TAKE 1 TABLET EVERY 8 HOURS (Patient taking differently: Take 10 mg by mouth every 8 (eight) hours.), Disp: 90 tablet, Rfl: 4 .  isosorbide mononitrate (IMDUR) 30 MG 24 hr tablet, TAKE ONE HALF (1/2) TABLET DAILY (Patient taking differently: Take 15 mg by mouth daily.), Disp: 90 tablet, Rfl: 2 .  levothyroxine (SYNTHROID) 88 MCG tablet, Take 1 tablet (88 mcg total) by mouth daily., Disp: 90 tablet, Rfl:  0 .  loratadine (CLARITIN) 10 MG tablet, Take 1 tablet (10 mg total) by mouth daily. (for itching) (Patient taking differently: Take 10 mg by mouth daily as needed for allergies or itching.), Disp: 30 tablet, Rfl: 11 .  meclizine (ANTIVERT) 25 MG tablet, Take 25 mg by mouth 3 (three) times daily as needed for dizziness or nausea. , Disp: , Rfl:  .  metoprolol tartrate (LOPRESSOR) 25 MG tablet, TAKE  (1)  TABLET TWICE A DAY. (Patient taking differently: Take 25 mg by mouth as needed.), Disp: 180 tablet, Rfl: 2 .  Multiple Vitamins-Minerals (CENTRUM SILVER ULTRA WOMENS PO), Take 1 tablet by  mouth daily., Disp: , Rfl:  .  nitroGLYCERIN (NITROSTAT) 0.4 MG SL tablet, Place 1 tablet (0.4 mg total) under the tongue every 5 (five) minutes as needed for chest pain. (Patient taking differently: Place 0.4 mg under the tongue every 5 (five) minutes as needed for chest pain (max 3 doses).), Disp: 25 tablet, Rfl: 2 .  pantoprazole (PROTONIX) 40 MG tablet, Take 1 tablet (40 mg total) by mouth every evening. (Patient taking differently: Take 40 mg by mouth daily.), Disp: 90 tablet, Rfl: 3 .  PARoxetine (PAXIL) 20 MG tablet, TAKE (1) TABLET DAILY IN THE MORNING. (Patient taking differently: Take 20 mg by mouth daily.), Disp: 90 tablet, Rfl: 0 .  rosuvastatin (CRESTOR) 10 MG tablet, Take 1 tablet (10 mg total) by mouth daily., Disp: 30 tablet, Rfl: 1 .  torsemide (DEMADEX) 20 MG tablet, Take 1 tablet (20 mg total) by mouth daily as needed (edema)., Disp: 30 tablet, Rfl: 0 Social History   Socioeconomic History  . Marital status: Widowed    Spouse name: Not on file  . Number of children: 4  . Years of education: 8  . Highest education level: 8th grade  Occupational History  . Occupation: Retired  Tobacco Use  . Smoking status: Never Smoker  . Smokeless tobacco: Never Used  Vaping Use  . Vaping Use: Never used  Substance and Sexual Activity  . Alcohol use: No  . Drug use: No  . Sexual activity: Never     Birth control/protection: Post-menopausal  Other Topics Concern  . Not on file  Social History Narrative  . Not on file   Social Determinants of Health   Financial Resource Strain: Low Risk   . Difficulty of Paying Living Expenses: Not hard at all  Food Insecurity: No Food Insecurity  . Worried About Charity fundraiser in the Last Year: Never true  . Ran Out of Food in the Last Year: Never true  Transportation Needs: No Transportation Needs  . Lack of Transportation (Medical): No  . Lack of Transportation (Non-Medical): No  Physical Activity: Inactive  . Days of Exercise per Week: 0 days  . Minutes of Exercise per Session: 0 min  Stress: No Stress Concern Present  . Feeling of Stress : Only a little  Social Connections: Moderately Integrated  . Frequency of Communication with Friends and Family: More than three times a week  . Frequency of Social Gatherings with Friends and Family: Once a week  . Attends Religious Services: More than 4 times per year  . Active Member of Clubs or Organizations: Yes  . Attends Archivist Meetings: More than 4 times per year  . Marital Status: Widowed  Intimate Partner Violence: Not At Risk  . Fear of Current or Ex-Partner: No  . Emotionally Abused: No  . Physically Abused: No  . Sexually Abused: No   Family History  Problem Relation Age of Onset  . Stroke Mother   . Other Mother        brain tumor  . Other Father        MI  . Heart attack Father   . Stroke Sister   . Macular degeneration Sister   . Diabetes Daughter   . Cancer Daughter        ovarian  . Colon polyps Daughter   . Atrial fibrillation Sister   . Hyperlipidemia Sister   . Osteoporosis Sister   . Stroke Sister   . Uterine cancer Sister   .  Heart attack Brother   . Heart disease Brother   . Asthma Brother   . Breast cancer Neg Hx     Objective: Office vital signs reviewed. BP (!) 157/64   Pulse (!) 45   Temp (!) 97.4 F (36.3 C)   Resp 20   Ht 5'  3" (1.6 m)   Wt 132 lb (59.9 kg)   SpO2 100%   BMI 23.38 kg/m   Physical Examination:  General: Awake, alert, No acute distress HEENT: Normal; sclera white. Cardio: Bradycardic with what appears to be regular rhythm.  No appreciable murmurs Pulm: clear to auscultation bilaterally, no wheezes, rhonchi or rales; normal work of breathing on room air Knee: Right knee with arthritic changes noted throughout.  No gross swelling, joint effusion, erythema or warmth.  JOINT INJECTION:  Patient denies allergy to antiseptics (including iodine) and anestheticsdenies.  Patient takes blood thinners/ antiplatelets.  Patient was given informed consent and a signed copy has been placed in the chart. Appropriate time out was taken. Area prepped and draped in usual sterile fashion. Anatomic landmarks were identified and injection site was marked.  Ethyl chloride spray was used to numb the area and 1 cc of methylprednisolone 40 mg/ml plus  3 cc of 1% lidocaine without epinephrine was injected into the right knee using a(n) anteriorlateral approach. The patient tolerated the procedure well and there were no immediate complications. Estimated blood loss is less than 1 cc.  Post procedure instructions were reviewed and handout outlining these instructions were provided to patient.   Assessment/ Plan: 87 y.o. female   Primary osteoarthritis of right knee  Irritable bowel syndrome with diarrhea  Dizziness  Bradycardia  Impacted cerumen of left ear  OA was treated with corticosteroid injection as above  I reinforced need to comply with FODMAP recommendations as per GIs last note  She has had chronic bradycardia in the past and after reading [name] last note her baseline heart rate appears to be in the 40s.  This was evident on her last interrogation of her defibrillator.  I am unsure if the dizziness that she is experiencing is chronic versus exacerbated by recent adjustments.  I have reached out  to her cardiologist but have not yet heard back from them.  I will CC the note today to them as well and have encouraged the patient to contact him to see if she needs to be evaluated.  Blood pressure was not at goal today.  I agree with Dr. Toya Smothers recommendation to increase hydralazine to 25 mg 3 times daily.  I recommended that they continue monitoring her blood pressures closely.  Cerumen impaction was treated with ear irrigation   No orders of the defined types were placed in this encounter.  No orders of the defined types were placed in this encounter.    Renee Norlander, DO [place] [phone]

## 2020-01-27 ENCOUNTER — Ambulatory Visit (INDEPENDENT_AMBULATORY_CARE_PROVIDER_SITE_OTHER): Payer: Medicare Other | Admitting: Cardiovascular Disease

## 2020-01-27 ENCOUNTER — Encounter: Payer: Self-pay | Admitting: Cardiovascular Disease

## 2020-01-27 VITALS — BP 138/68 | HR 49 | Ht 63.0 in | Wt 133.0 lb

## 2020-01-27 DIAGNOSIS — Z79899 Other long term (current) drug therapy: Secondary | ICD-10-CM

## 2020-01-27 DIAGNOSIS — Z5181 Encounter for therapeutic drug level monitoring: Secondary | ICD-10-CM

## 2020-01-27 DIAGNOSIS — I10 Essential (primary) hypertension: Secondary | ICD-10-CM | POA: Diagnosis not present

## 2020-01-27 DIAGNOSIS — E7800 Pure hypercholesterolemia, unspecified: Secondary | ICD-10-CM | POA: Diagnosis not present

## 2020-01-27 DIAGNOSIS — Z7901 Long term (current) use of anticoagulants: Secondary | ICD-10-CM | POA: Diagnosis not present

## 2020-01-27 DIAGNOSIS — I480 Paroxysmal atrial fibrillation: Secondary | ICD-10-CM

## 2020-01-27 DIAGNOSIS — I4729 Other ventricular tachycardia: Secondary | ICD-10-CM

## 2020-01-27 DIAGNOSIS — N1832 Chronic kidney disease, stage 3b: Secondary | ICD-10-CM

## 2020-01-27 DIAGNOSIS — Z95810 Presence of automatic (implantable) cardiac defibrillator: Secondary | ICD-10-CM | POA: Diagnosis not present

## 2020-01-27 DIAGNOSIS — I25119 Atherosclerotic heart disease of native coronary artery with unspecified angina pectoris: Secondary | ICD-10-CM | POA: Diagnosis not present

## 2020-01-27 DIAGNOSIS — E119 Type 2 diabetes mellitus without complications: Secondary | ICD-10-CM

## 2020-01-27 DIAGNOSIS — I472 Ventricular tachycardia: Secondary | ICD-10-CM

## 2020-01-27 MED ORDER — LEVOTHYROXINE SODIUM 100 MCG PO TABS: 100.0000 ug | ORAL_TABLET | Freq: Every day | ORAL | 5 refills | Status: DC

## 2020-01-27 NOTE — Patient Instructions (Addendum)
Medication Instructions:  METOPROLOL: Wean off the metoprolol by taking it once daily for the next three days then you can stop it.  STOP the Torsemide  INCREASE the Levothyroxine to 100 mcg once daily  *If you need a refill on your cardiac medications before your next appointment, please call your pharmacy*   Lab Work: None ordered If you have labs (blood work) drawn today and your tests are completely normal, you will receive your results only by: Marland Kitchen MyChart Message (if you have MyChart) OR . A paper copy in the mail If you have any lab test that is abnormal or we need to change your treatment, we will call you to review the results.   Testing/Procedures: None ordered   Follow-Up: At [facility], you and your health needs are our priority.  As part of our continuing mission to provide you with exceptional heart care, we have created designated Provider Care Teams.  These Care Teams include your primary Cardiologist (physician) and Advanced Practice Providers (APPs -  Physician Assistants and Nurse Practitioners) who all work together to provide you with the care you need, when you need it.  We recommend signing up for the patient portal called "MyChart".  Sign up information is provided on this After Visit Summary.  MyChart is used to connect with patients for Virtual Visits (Telemedicine).  Patients are able to view lab/test results, encounter notes, upcoming appointments, etc.  Non-urgent messages can be sent to your provider as well.   To learn more about what you can do with MyChart, go to [email or web address].    Your next appointment:   3 month(s) on a device day  The format for your next appointment:   In Person  Provider:   Sanda Klein, MD   Other Instructions Your physician recommends that you weigh yourself everyday at the same time, on the same scale and with the same amount of clothing.  Please call us if your weight is 140 or higher at [phone]

## 2020-02-01 ENCOUNTER — Encounter: Payer: Self-pay | Admitting: Cardiovascular Disease

## 2020-02-01 NOTE — Progress Notes (Signed)
. Patient ID: [name], female   DOB: 25-Dec-1931, 87 y.o.   MRN: [id number]    Cardiology Office Note    Date:  02/01/2020   ID:  [name], DOB [birth year], MRN [id number]  PCP:  [name], DO  Cardiologist:   Sanda Klein, MD   Chief Complaint  Patient presents with  . Bradycardia    History of Present Illness:  [name] is a 87 y.o. 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  Shre has prominent complaints of fatigue. She has had heart rates as slow as 45, now 49 bpm, due to amiodaroen and metorpolol. TSH is mildly elevated, despite LT4 supplements.  In recent years she has had increasing problems with symptomatic atrial fibrillation.  Although she has a single chamber defibrillator, making it impossible to precisely diagnose the episodes of atrial fibrillation, there is clear evidence of periods of increased ventricular rate from her baseline, likely representing symptomatic atrial fibrillation.  She develops palpitation, chest discomfort, shortness of breath and if the spells are lengthy she eventually develops overt lower extremity edema.  She underwent a cardioversion in January 2021 from atrial fibrillation to normal rhythm, associated with improvement in symptoms.  She has been taking amiodarone since February.  She was hospitalized with dizziness, orthostatic hypotension and severe hypothyroidism (TSH 56) in July of this year.  It is likely she had iatrogenic hypothyroidism  due to amiodarone.  Levothyroxine was started, beta-blocker therapy was discontinued.  She was briefly hospitalized December 7-8 with complaints of chest pain and palpitations, after an electrocardiogram performed in the orthopedic office showed an irregular heart rhythm.  The electrocardiogram that I can see from December 6 and December 7 show background atrial fibrillation with ventricular paced rhythm.  Cardiac enzymes were normal.  She was seen in consultation by Dr. Angelena Form and there was no compelling reason to proceed with coronary invasive evaluation. Her electrocardiogram 12/21 shows sinus bradycardia at about 49 bpm with nearly isorhythmic ventricular pacing at 50 bpm.  This is not truly atrial sensed-ventricular paced rhythm (single-chamber ICD).  Interrogation of her defibrillator (single lead device) shows no recent episodes of high ventricular rates.  The underlying rhythm was sinus bradycardia at 47-48 bpm.  Hysteresis of 10% was programmed to allow more native AV association, today.  The wise normal device function.  She has not had any episodes of high ventricular rate since a very brief episode of polymorphic VT that occurred in April.  Ventricular pacing has increased from 0 to 9% over the last 6 months.  Estimated generator longevity is 11.5 years.   She was on treatment with disopyramide for years for her history of catecholaminergic polymorphic VT, but this medication had to be gradually discontinued due to side effects, especially orthostatic hypotension and dry mouth.  In the past she developed torsades de pointes with sotalol and diarrhea with quinidine.  She has not had defibrillator discharges since the 1990s.   Past Medical History:  Diagnosis Date  . AICD (automatic cardioverter/defibrillator) present   . Allergy    SESONAL  . Anxiety   . ARTHRITIS   .  Arthritis   . Atrial fibrillation ([place])   . CAD (coronary artery disease)    a. history of cardiac arrest 1993;  b.  s/p LAD/LCX stenting in 2003;  c. 07/2011 Cath: LM nl, LAD patent stent, D1 80ost, LCX patent stent, RCA min irregs;  d. 04/2012 MV: EF 66%, no ishcemia;  e. 12/2013 Echo: Ef 55%, no rwma, Gr 1 DD, triv AI/MR, mildly dil LA;  f. 12/2013 Lexi MV: intermediate risk - apical ischemia and inf/infsept fixed defect, ? artifactual.  . CAD in native artery 12/31/2013   Previous stents to LAD and Ramus patent on cath today 12/31/13 also There is severe disease in the ostial first diagonal which is unchanged from most recent cardiac catheterization. The right coronary artery could not be engaged selectively but nonselective angiography showed no significant disease in the proximal and midsegment.     . Cataract    DENIES  . Chronic back pain   . Chronic sinus bradycardia   . CKD (chronic kidney disease), stage III ([place])   . Clotting disorder (HCC)    DVT  . COLITIS 12/02/2007   Qualifier: Diagnosis of  By: Nils Pyle CMA ([place]), [name]    . Diabetes mellitus without complication ([place])    DENIES  . Diverticulosis of colon (without mention of hemorrhage) 2007   Colonoscopy   . Esophageal stricture    a. 2012 s/p dil.  . Esophagitis, unspecified    a. 2012 EGD  . EXTERNAL HEMORRHOIDS   . GERD (gastroesophageal reflux disease)    omeprazole  . H/O hiatal hernia   . Hiatal hernia    a. 2012 EGD.  Marland Kitchen History of DVT in the past, not on Coumadin now    left  leg  . HYPERCHOLESTEROLEMIA   . Hypertension   . ICD (implantable cardiac defibrillator) in place    a. s/p initial ICD in 1993 in setting of cardiac arrest;  b. 01/2007 gen change: Guidant T135 Vitality DS VR single lead ICD.  . Macular degeneration    gets injecton in eye every 5 weeks- last injection - 05/03/2013   . Myocardial infarction ([place]) 1993  . Near syncope 05/22/2015  . Nephrolithiasis, just saw Dr [name]   . Orthostatic hypotension   . Peripheral vascular disease ([place])    ???  . RECTAL BLEEDING 12/03/2007   Qualifier: Diagnosis of   By: Sharlett Iles MD Byrd Hesselbach   . Unspecified gastritis and gastroduodenitis without mention of hemorrhage    a. 2003 EGD->not noted on 2012 EGD.  Marland Kitchen Unstable angina, neg MI, cath stable.maybe GI 12/30/2013    Past Surgical History:  Procedure Laterality Date  . BREAST BIOPSY Left 11/2013  . CARDIAC CATHETERIZATION    . CARDIAC CATHETERIZATION  01/01/15   patent stents -there is severe disease in ostial 1st diag but without change.   Marland Kitchen CARDIAC DEFIBRILLATOR PLACEMENT  02/05/2007   Guidant  . CARDIOVASCULAR STRESS TEST  12/30/2013   abnormal  . CARDIOVERSION N/A 02/13/2019   Procedure: CARDIOVERSION;  Surgeon: Sanda Klein, MD;  Location: [facility];  Service: Cardiovascular;  Laterality: N/A;  . CATARACT EXTRACTION Right    Dr. Katy Fitch  . COLONOSCOPY    . coronary stents     . CYSTOSCOPY WITH URETEROSCOPY Right 05/20/2013   Procedure: CYSTOSCOPY, RIGHT URETEROSCOPY STONE EXTRACTION, Insertion of right DOUBLE J STENT ;  Surgeon: Irine Seal, MD;  Location: [facility];  Service: Urology;  Laterality: Right;  . CYSTOSCOPY WITH URETEROSCOPY AND STENT  PLACEMENT N/A 06/03/2013   Procedure: SECOND LOOK CYSTOSCOPY WITH URETEROSCOPY  HOLMIUM LASER LITHO AND STONE EXTRACTION Sammie Bench ;  Surgeon: Malka So, MD;  Location: [facility];  Service: Urology;  Laterality: N/A;  . ESOPHAGOGASTRODUODENOSCOPY (EGD) WITH PROPOFOL N/A 11/20/2019   Procedure: ESOPHAGOGASTRODUODENOSCOPY (EGD) WITH PROPOFOL;  Surgeon: Daneil Dolin, MD;  [facility];  Service: Endoscopy;  Laterality: N/A;  . GIVENS CAPSULE STUDY N/A 10/30/2016   Procedure: GIVENS CAPSULE STUDY;  Surgeon: Irene Shipper, MD;  Location: [facility];  Service: Endoscopy;  Laterality: N/A;  NEEDS TO BE ADMITTED FOR OBSERVATION PT. HAS DEFIB  . HEMORROIDECTOMY  80's  . HOLMIUM LASER APPLICATION Right 0/08/6759   Procedure: HOLMIUM LASER APPLICATION;  Surgeon: Irine Seal, MD;  Location: [facility];  Service: Urology;  Laterality: Right;  .  HYSTEROSCOPY WITH D & C  09/02/2010   Procedure: DILATATION AND CURETTAGE (D&C) /HYSTEROSCOPY;  Surgeon: Margarette Asal;  Location: [facility];  Service: Gynecology;  Laterality: N/A;  Dilation and Curettage with Hysteroscopy and Polypectomy  . IMPLANTABLE CARDIOVERTER DEFIBRILLATOR (ICD) GENERATOR CHANGE N/A 02/10/2014   Procedure: ICD GENERATOR CHANGE;  Surgeon: Sanda Klein, MD;  Location: [facility];  Service: Cardiovascular;  Laterality: N/A;  . LEFT HEART CATHETERIZATION WITH CORONARY ANGIOGRAM N/A 07/28/2011   Procedure: LEFT HEART CATHETERIZATION WITH CORONARY ANGIOGRAM;  Surgeon: Lorretta Harp, MD;  Location: [facility];  Service: Cardiovascular;  Laterality: N/A;  . LEFT HEART CATHETERIZATION WITH CORONARY ANGIOGRAM N/A 12/31/2013   Procedure: LEFT HEART CATHETERIZATION WITH CORONARY ANGIOGRAM;  Surgeon: Wellington Hampshire, MD;  Location: [facility];  Service: Cardiovascular;  Laterality: N/A;    Current Medications: Outpatient Medications Prior to Visit  Medication Sig Dispense Refill  . amiodarone (PACERONE) 200 MG tablet TAKE 1 TABLET DAILY 30 tablet 3  . apixaban (ELIQUIS) 2.5 MG TABS tablet Take 1 tablet (2.5 mg total) by mouth 2 (two) times daily. 60 tablet 1  . carboxymethylcellulose (REFRESH PLUS) 0.5 % SOLN Place 1 drop into both eyes 3 (three) times daily as needed (dry/irritated eyes.).    Marland Kitchen Cholecalciferol (VITAMIN D) 50 MCG (2000 UT) CAPS Take 2,000 Units by mouth daily.     . diphenoxylate-atropine (LOMOTIL) 2.5-0.025 MG tablet TAKE 1 TABLET EVERY 8 HOURS AS NEEDED FOR DIARRHEA 30 tablet 0  . feeding supplement (ENSURE ENLIVE / ENSURE PLUS) LIQD Take 237 mLs by mouth 2 (two) times daily between meals.    . hydrALAZINE (APRESOLINE) 25 MG tablet Take 25 mg by mouth 3 (three) times daily.    . isosorbide mononitrate (IMDUR) 30 MG 24 hr tablet TAKE ONE HALF (1/2) TABLET DAILY 90 tablet 2  . loratadine (CLARITIN) 10 MG tablet Take 1 tablet (10 mg total) by mouth daily. (for  itching) 30 tablet 11  . meclizine (ANTIVERT) 25 MG tablet Take 25 mg by mouth 3 (three) times daily as needed for dizziness or nausea.     . Multiple Vitamins-Minerals (CENTRUM SILVER ULTRA WOMENS PO) Take 1 tablet by mouth daily.    . nitroGLYCERIN (NITROSTAT) 0.4 MG SL tablet Place 1 tablet (0.4 mg total) under the tongue every 5 (five) minutes as needed for chest pain. 25 tablet 2  . pantoprazole (PROTONIX) 40 MG tablet Take 1 tablet (40 mg total) by mouth every evening. 90 tablet 3  . PARoxetine (PAXIL) 20 MG tablet TAKE (1) TABLET DAILY IN THE MORNING. 90 tablet 0  . rosuvastatin (CRESTOR) 10 MG tablet Take 1  tablet (10 mg total) by mouth daily. 30 tablet 1  . levothyroxine (SYNTHROID) 88 MCG tablet Take 1 tablet (88 mcg total) by mouth daily. 90 tablet 0  . metoprolol tartrate (LOPRESSOR) 25 MG tablet TAKE  (1)  TABLET TWICE A DAY. 180 tablet 2  . torsemide (DEMADEX) 20 MG tablet Take 1 tablet (20 mg total) by mouth daily as needed (edema). 30 tablet 0   No facility-administered medications prior to visit.     Allergies:   Sotalol, Bactrim [sulfamethoxazole-trimethoprim], Ciprofloxacin, Tramadol, Actos [pioglitazone], Adhesive [tape], Atorvastatin, Contrast media [iodinated diagnostic agents], Latex, Pedi-pre tape spray [wound dressing adhesive], and Sulfa antibiotics   Social History   Socioeconomic History  . Marital status: Widowed    Spouse name: Not on file  . Number of children: 4  . Years of education: 8  . Highest education level: 8th grade  Occupational History  . Occupation: Retired  Tobacco Use  . Smoking status: Never Smoker  . Smokeless tobacco: Never Used  Vaping Use  . Vaping Use: Never used  Substance and Sexual Activity  . Alcohol use: No  . Drug use: No  . Sexual activity: Never    Birth control/protection: Post-menopausal  Other Topics Concern  . Not on file  Social History Narrative  . Not on file   Social Determinants of Health   Financial  Resource Strain: Low Risk   . Difficulty of Paying Living Expenses: Not hard at all  Food Insecurity: No Food Insecurity  . Worried About Charity fundraiser in the Last Year: Never true  . Ran Out of Food in the Last Year: Never true  Transportation Needs: No Transportation Needs  . Lack of Transportation (Medical): No  . Lack of Transportation (Non-Medical): No  Physical Activity: Inactive  . Days of Exercise per Week: 0 days  . Minutes of Exercise per Session: 0 min  Stress: No Stress Concern Present  . Feeling of Stress : Only a little  Social Connections: Moderately Integrated  . Frequency of Communication with Friends and Family: More than three times a week  . Frequency of Social Gatherings with Friends and Family: Once a week  . Attends Religious Services: More than 4 times per year  . Active Member of Clubs or Organizations: Yes  . Attends Archivist Meetings: More than 4 times per year  . Marital Status: Widowed     Family History:  The patient's family history includes Asthma in her brother; Atrial fibrillation in her sister; Cancer in her daughter; Colon polyps in her daughter; Diabetes in her daughter; Heart attack in her brother and father; Heart disease in her brother; Hyperlipidemia in her sister; Macular degeneration in her sister; Osteoporosis in her sister; Other in her father and mother; Stroke in her mother, sister, and sister; Uterine cancer in her sister.   ROS:   Please see the history of present illness.    ROS All other systems are reviewed and are negative.   PHYSICAL EXAM:   VS:  BP 138/68   Pulse (!) 49   Ht 5\' 3"  (1.6 m)   Wt 133 lb (60.3 kg)   SpO2 95%   BMI 23.56 kg/m       General: Alert, oriented x3, no distress, healthy L SCL ICD site Head: no evidence of trauma, PERRL, EOMI, no exophtalmos or lid lag, no myxedema, no xanthelasma; normal ears, nose and oropharynx Neck: normal jugular venous pulsations and no hepatojugular reflux;  brisk carotid  pulses without delay and no carotid bruits Chest: clear to auscultation, no signs of consolidation by percussion or palpation, normal fremitus, symmetrical and full respiratory excursions Cardiovascular: normal position and quality of the apical impulse, regular rhythm, normal first and second heart sounds, no murmurs, rubs or gallops Abdomen: no tenderness or distention, no masses by palpation, no abnormal pulsatility or arterial bruits, normal bowel sounds, no hepatosplenomegaly Extremities: no clubbing, cyanosis or edema; 2+ radial, ulnar and brachial pulses bilaterally; 2+ right femoral, posterior tibial and dorsalis pedis pulses; 2+ left femoral, posterior tibial and dorsalis pedis pulses; no subclavian or femoral bruits Neurological: grossly nonfocal Psych: Normal mood and affect    Wt Readings from Last 3 Encounters:  01/27/20 133 lb (60.3 kg)  01/26/20 132 lb (59.9 kg)  01/15/20 137 lb (62.1 kg)    Studies/Labs Reviewed:   EKG:  EKG is ordered today and shows nearly isorhythmic sinus bradycardia and ventricular pacing, but the PR is very short and gradually shortness of the tracing.   Recent Labs: 11/14/2019: B Natriuretic Peptide 109.0 12/24/2019: Magnesium 2.2 01/05/2020: ALT 53; BUN 16; Creatinine, Ser 1.31; Hemoglobin 11.5; Platelets 223; Potassium 4.0; Sodium 137; TSH 7.000   Lipid Panel    Component Value Date/Time   CHOL 164 12/23/2019 2104   CHOL 139 10/23/2018 0947   CHOL 140 07/31/2012 1019   TRIG 61 12/23/2019 2104   TRIG 115 06/07/2015 1111   TRIG 161 (H) 07/31/2012 1019   HDL 66 12/23/2019 2104   HDL 53 10/23/2018 0947   HDL 68 06/07/2015 1111   HDL 43 07/31/2012 1019   CHOLHDL 2.5 12/23/2019 2104   VLDL 12 12/23/2019 2104   LDLCALC 86 12/23/2019 2104   LDLCALC 65 10/23/2018 0947   LDLCALC 65 09/09/2013 0841   LDLCALC 65 07/31/2012 1019     ASSESSMENT:    1. Paroxysmal atrial fibrillation (HCC)   2. Encounter for monitoring  amiodarone therapy   3. Catecholaminergic polymorphic ventricular tachycardia ([place])   4. Long term (current) use of anticoagulants   5. Coronary artery disease involving native coronary artery of native heart with angina pectoris ([place])   6. Essential hypertension   7. Diabetes mellitus type 2, diet-controlled ([place])   8. Stage 3b chronic kidney disease ([place])   9. ICD (implantable cardioverter-defibrillator) in place   10. Hypercholesterolemia      PLAN:  In order of problems listed above:  1. AFib: This has often presented with symptoms of chest discomfort and shortness of breath, but the overall burden has decreased with amiodarone.  This led to her brief hospitalization earlier this month, without evidence of myocardial injury.  Continue amiodarone and anticoagulation. CHADSVasc 6 (age 8, gender, DM, CAD, hypertension).  Amiodarone will likely continue to provide adequate rate control during atrial fibrillation.  We will discontinue the metoprolol (wean gradually) to try to allow faster sinus rates and improved quality of life.  She does not want to upgrade to a dual-chamber pacemaker or have any other invasive procedures. 2. Amiodarone:  associated with iatrogenic hypothyroidism.  Increase levothyroxine to 100 mcg daily. 3. VT: Very brief episode of polymorphic VT in April, otherwise no events this year.  She has not had any therapy from her defibrillator since the early 1990s.  She did have a rather lengthy 25 beat nonsustained VT run in September 2020 when we decreased her beta-blocker dose due to bradycardia.  Disopyramide was stopped several years ago due to complaints of dry mouth and orthostatic hypotension.  Suspect that amiodarone will provide adequate control of this arrhythmia. 4. Eliquis: Dose adjusted for age and worsening renal function.  On December 9 the creatinine was as high as 1.69.  I think she should remain on the lower dose of anticoagulant. 5. CAD: Currently asymptomatic.   Does not have angina with exertion, but often develops chest discomfort when she has atrial fibrillation.  She has not required revascularization since 2003.  Her last assessment was a cardiac catheterization 2015 that did not show any significant coronary stenoses. 6. HTN/Orthostatic hypotension: Acceptable blood pressure today. Stop torsemide. 7. DM: Well-controlled with diet, no medications; recent hemoglobin A1c 6.6 % in July 8. CKD 3b: Baseline creatinine seems to be in the 1.3-1.4 range, corresponding to a GFR less than 40, transient deterioration during her recent hospitalization to a creatinine of 1.69 (GFR 29).  We will stop the torsemide. 9. ICD: Normal single-chamber device function.  Does not have an atrial lead.  Continue remote downloads every 3 months. Has symptoms both with AFib and with asynchronous ventricular pacing due to severe sinus bradycardia. 10. HLP: Reinforced importance of daily compliance with statin.    Medication Adjustments/Labs and Tests Ordered: Current medicines are reviewed at length with the patient today.  Concerns regarding medicines are outlined above.  Medication changes, Labs and Tests ordered today are listed in the Patient Instructions below. Patient Instructions  Medication Instructions:  METOPROLOL: Wean off the metoprolol by taking it once daily for the next three days then you can stop it.  STOP the Torsemide  INCREASE the Levothyroxine to 100 mcg once daily  *If you need a refill on your cardiac medications before your next appointment, please call your pharmacy*   Lab Work: None ordered If you have labs (blood work) drawn today and your tests are completely normal, you will receive your results only by: Marland Kitchen MyChart Message (if you have MyChart) OR . A paper copy in the mail If you have any lab test that is abnormal or we need to change your treatment, we will call you to review the results.   Testing/Procedures: None  ordered   Follow-Up: At [facility], you and your health needs are our priority.  As part of our continuing mission to provide you with exceptional heart care, we have created designated Provider Care Teams.  These Care Teams include your primary Cardiologist (physician) and Advanced Practice Providers (APPs -  Physician Assistants and Nurse Practitioners) who all work together to provide you with the care you need, when you need it.  We recommend signing up for the patient portal called "MyChart".  Sign up information is provided on this After Visit Summary.  MyChart is used to connect with patients for Virtual Visits (Telemedicine).  Patients are able to view lab/test results, encounter notes, upcoming appointments, etc.  Non-urgent messages can be sent to your provider as well.   To learn more about what you can do with MyChart, go to [email or web address].    Your next appointment:   3 month(s) on a device day  The format for your next appointment:   In Person  Provider:   Sanda Klein, MD   Other Instructions Your physician recommends that you weigh yourself everyday at the same time, on the same scale and with the same amount of clothing.  Please call us if your weight is 140 or higher at [phone]      Signed, Sanda Klein, MD  02/01/2020 4:07 PM    [facility] [address], [place], [place]  [address] Phone: [phone]; Fax: [phone]

## 2020-02-04 ENCOUNTER — Other Ambulatory Visit (INDEPENDENT_AMBULATORY_CARE_PROVIDER_SITE_OTHER): Payer: Medicare Other | Admitting: Family Medicine

## 2020-02-04 DIAGNOSIS — K580 Irritable bowel syndrome with diarrhea: Secondary | ICD-10-CM | POA: Diagnosis not present

## 2020-02-04 DIAGNOSIS — R7989 Other specified abnormal findings of blood chemistry: Secondary | ICD-10-CM | POA: Diagnosis not present

## 2020-02-04 MED ORDER — RIFAXIMIN 550 MG PO TABS: 550.0000 mg | ORAL_TABLET | Freq: Three times a day (TID) | ORAL | 0 refills | Status: DC

## 2020-02-04 NOTE — Progress Notes (Signed)
Remote ICD transmission.   

## 2020-02-04 NOTE — Progress Notes (Signed)
Telephone visit  Subjective: CC: IBS-D PCP: [name], DO BE:[id number] [name] is a 87 y.o. female calls for telephone consult today. Patient provides verbal consent for consult held via phone.  Due to COVID-19 pandemic this visit was conducted virtually. This visit type was conducted due to national recommendations for restrictions regarding the COVID-19 Pandemic (e.g. social distancing, sheltering in place) in an effort to limit this patient's exposure and mitigate transmission in our community. All issues noted in this document were discussed and addressed.  A physical exam was not performed with this format.   Location of patient: home Location of provider: [facility] Others present for call: none  1.  Diarrhea Patient with ongoing abdominal cramping that seem to be at the top of her abdomen and diarrhea.  She was seen by GI at the end of December and after extensive GI work-up they suspect that this is irritable bowel syndrome with diarrhea.  She notes that she had quite a bit of difficulty getting comfortable.  She has been treated with amitriptyline thus far, Lomotil, OTC antidiarrheals and is taking probiotics without any improvement.  No blood in stool.  She cannot recall if she had any stool studies done before.  2. hypothyroidism, drug-induced Patient with what appears to be drug-induced hypothyroidism.  She is currently treated with Synthroid 100 mcg daily.  Last TSH was still above goal at 7.  She has been having diarrhea as above  ROS: Per HPI  Allergies  Allergen Reactions  . Sotalol Other (See Comments)    Torsades   . Bactrim [Sulfamethoxazole-Trimethoprim]     Itchy rash  . Ciprofloxacin Nausea Only  . Tramadol Other (See Comments)    Hallucination, bad-dream  . Actos [Pioglitazone] Swelling  . Adhesive [Tape] Other (See Comments)    Causes sores  . Atorvastatin Other (See Comments)    myalgia  . Contrast Media [Iodinated Diagnostic Agents] Other (See  Comments)    Headache (no action/pre-med required)  . Latex Rash  . Pedi-Pre Tape Spray [Wound Dressing Adhesive] Other (See Comments)    Causes sores  . Sulfa Antibiotics Other (See Comments)    Causes sores   Past Medical History:  Diagnosis Date  . AICD (automatic cardioverter/defibrillator) present   . Allergy    SESONAL  . Anxiety   . ARTHRITIS   . Arthritis   . Atrial fibrillation ([place])   . CAD (coronary artery disease)    a. history of cardiac arrest 1993;  b. s/p LAD/LCX stenting in 2003;  c. 07/2011 Cath: LM nl, LAD patent stent, D1 80ost, LCX patent stent, RCA min irregs;  d. 04/2012 MV: EF 66%, no ishcemia;  e. 12/2013 Echo: Ef 55%, no rwma, Gr 1 DD, triv AI/MR, mildly dil LA;  f. 12/2013 Lexi MV: intermediate risk - apical ischemia and inf/infsept fixed defect, ? artifactual.  . CAD in native artery 12/31/2013   Previous stents to LAD and Ramus patent on cath today 12/31/13 also There is severe disease in the ostial first diagonal which is unchanged from most recent cardiac catheterization. The right coronary artery could not be engaged selectively but nonselective angiography showed no significant disease in the proximal and midsegment.     . Cataract    DENIES  . Chronic back pain   . Chronic sinus bradycardia   . CKD (chronic kidney disease), stage III ([place])   . Clotting disorder ([facility])    DVT  . COLITIS 12/02/2007   Qualifier: Diagnosis of  By: Nils Pyle CMA ([name], [name]    . Diabetes mellitus without complication ([place])    DENIES  . Diverticulosis of colon (without mention of hemorrhage) 2007   Colonoscopy   . Esophageal stricture    a. 2012 s/p dil.  . Esophagitis, unspecified    a. 2012 EGD  . EXTERNAL HEMORRHOIDS   . GERD (gastroesophageal reflux disease)    omeprazole  . H/O hiatal hernia   . Hiatal hernia    a. 2012 EGD.  Marland Kitchen History of DVT in the past, not on Coumadin now    left  leg  . HYPERCHOLESTEROLEMIA   . Hypertension   . ICD (implantable  cardiac defibrillator) in place    a. s/p initial ICD in 1993 in setting of cardiac arrest;  b. 01/2007 gen change: Guidant T135 Vitality DS VR single lead ICD.  . Macular degeneration    gets injecton in eye every 5 weeks- last injection - 05/03/2013   . Myocardial infarction ([place]) 1993  . Near syncope 05/22/2015  . Nephrolithiasis, just saw Dr [name]   . Orthostatic hypotension   . Peripheral vascular disease ([place])    ???  . RECTAL BLEEDING 12/03/2007   Qualifier: Diagnosis of  By: Sharlett Iles MD Byrd Hesselbach   . Unspecified gastritis and gastroduodenitis without mention of hemorrhage    a. 2003 EGD->not noted on 2012 EGD.  Marland Kitchen Unstable angina, neg MI, cath stable.maybe GI 12/30/2013    Current Outpatient Medications:  .  rifaximin (XIFAXAN) 550 MG TABS tablet, Take 1 tablet (550 mg total) by mouth 3 (three) times daily for 14 days., Disp: 42 tablet, Rfl: 0 .  amiodarone (PACERONE) 200 MG tablet, TAKE 1 TABLET DAILY, Disp: 30 tablet, Rfl: 3 .  apixaban (ELIQUIS) 2.5 MG TABS tablet, Take 1 tablet (2.5 mg total) by mouth 2 (two) times daily., Disp: 60 tablet, Rfl: 1 .  carboxymethylcellulose (REFRESH PLUS) 0.5 % SOLN, Place 1 drop into both eyes 3 (three) times daily as needed (dry/irritated eyes.)., Disp: , Rfl:  .  Cholecalciferol (VITAMIN D) 50 MCG (2000 UT) CAPS, Take 2,000 Units by mouth daily. , Disp: , Rfl:  .  diphenoxylate-atropine (LOMOTIL) 2.5-0.025 MG tablet, TAKE 1 TABLET EVERY 8 HOURS AS NEEDED FOR DIARRHEA, Disp: 30 tablet, Rfl: 0 .  feeding supplement (ENSURE ENLIVE / ENSURE PLUS) LIQD, Take 237 mLs by mouth 2 (two) times daily between meals., Disp: , Rfl:  .  hydrALAZINE (APRESOLINE) 25 MG tablet, Take 25 mg by mouth 3 (three) times daily., Disp: , Rfl:  .  isosorbide mononitrate (IMDUR) 30 MG 24 hr tablet, TAKE ONE HALF (1/2) TABLET DAILY, Disp: 90 tablet, Rfl: 2 .  levothyroxine (SYNTHROID) 100 MCG tablet, Take 1 tablet (100 mcg total) by mouth daily., Disp: 30 tablet,  Rfl: 5 .  loratadine (CLARITIN) 10 MG tablet, Take 1 tablet (10 mg total) by mouth daily. (for itching), Disp: 30 tablet, Rfl: 11 .  meclizine (ANTIVERT) 25 MG tablet, Take 25 mg by mouth 3 (three) times daily as needed for dizziness or nausea. , Disp: , Rfl:  .  Multiple Vitamins-Minerals (CENTRUM SILVER ULTRA WOMENS PO), Take 1 tablet by mouth daily., Disp: , Rfl:  .  nitroGLYCERIN (NITROSTAT) 0.4 MG SL tablet, Place 1 tablet (0.4 mg total) under the tongue every 5 (five) minutes as needed for chest pain., Disp: 25 tablet, Rfl: 2 .  pantoprazole (PROTONIX) 40 MG tablet, Take 1 tablet (40 mg total) by mouth every evening., Disp: 90  tablet, Rfl: 3 .  PARoxetine (PAXIL) 20 MG tablet, TAKE (1) TABLET DAILY IN THE MORNING., Disp: 90 tablet, Rfl: 0 .  rosuvastatin (CRESTOR) 10 MG tablet, Take 1 tablet (10 mg total) by mouth daily., Disp: 30 tablet, Rfl: 1  Assessment/ Plan: 87 y.o. female   TSH elevation - Plan: Thyroid Panel With TSH  Irritable bowel syndrome with diarrhea - Plan: Renal Function Panel, Thyroid Panel With TSH, CBC, Cdiff NAA+O+P+Stool Culture, rifaximin (XIFAXAN) 550 MG TABS tablet  Will check for possible thyroid mediated diarrhea but it sounds like she had a fairly extensive GI work-up and all signs point to irritable bowel syndrome with diarrhea.  I could not find where she was previously treated with Xifaxan nor can I find her previous stool studies and therefore I will order these to have a little bit more information.  Her symptoms are thus far refractory to Lomotil, OTC antidiarrheal's.  We will also CC GI as an FYI to see if there are any other recommendations that they have at this point  Start time: 3:51pm End time: 4:02pm  Total time spent on patient care (including telephone call/ virtual visit): 11 minutes  [place], [place] [phone]

## 2020-02-05 ENCOUNTER — Other Ambulatory Visit: Payer: Medicare Other

## 2020-02-05 ENCOUNTER — Other Ambulatory Visit: Payer: Self-pay

## 2020-02-05 DIAGNOSIS — R7989 Other specified abnormal findings of blood chemistry: Secondary | ICD-10-CM

## 2020-02-05 DIAGNOSIS — K580 Irritable bowel syndrome with diarrhea: Secondary | ICD-10-CM

## 2020-02-06 ENCOUNTER — Telehealth: Payer: Self-pay | Admitting: *Deleted

## 2020-02-06 ENCOUNTER — Other Ambulatory Visit: Payer: Self-pay | Admitting: Family Medicine

## 2020-02-06 DIAGNOSIS — A0472 Enterocolitis due to Clostridium difficile, not specified as recurrent: Secondary | ICD-10-CM

## 2020-02-06 LAB — CBC
Hematocrit: 36.8 % (ref 34.0–46.6)
Hemoglobin: 12.3 g/dL (ref 11.1–15.9)
MCH: 30.8 pg (ref 26.6–33.0)
MCHC: 33.4 g/dL (ref 31.5–35.7)
MCV: 92 fL (ref 79–97)
Platelets: 223 10*3/uL (ref 150–450)
RBC: 3.99 x10E6/uL (ref 3.77–5.28)
RDW: 12.8 % (ref 11.7–15.4)
WBC: 5.1 10*3/uL (ref 3.4–10.8)

## 2020-02-06 LAB — RENAL FUNCTION PANEL
Albumin: 4.1 g/dL (ref 3.6–4.6)
BUN/Creatinine Ratio: 13 (ref 12–28)
BUN: 20 mg/dL (ref 8–27)
CO2: 22 mmol/L (ref 20–29)
Calcium: 9.4 mg/dL (ref 8.7–10.3)
Chloride: 97 mmol/L (ref 96–106)
Creatinine, Ser: 1.52 mg/dL — ABNORMAL HIGH (ref 0.57–1.00)
GFR calc Af Amer: 36 mL/min/{1.73_m2} — ABNORMAL LOW (ref >59)
GFR calc non Af Amer: 31 mL/min/{1.73_m2} — ABNORMAL LOW (ref >59)
Glucose: 82 mg/dL (ref 65–99)
Phosphorus: 3.7 mg/dL (ref 3.0–4.3)
Potassium: 3.8 mmol/L (ref 3.5–5.2)
Sodium: 134 mmol/L (ref 134–144)

## 2020-02-06 LAB — THYROID PANEL WITH TSH
Free Thyroxine Index: 4.3 (ref 1.2–4.9)
T3 Uptake Ratio: 34 % (ref 24–39)
T4, Total: 12.5 ug/dL — ABNORMAL HIGH (ref 4.5–12.0)
TSH: 6.18 u[IU]/mL — ABNORMAL HIGH (ref 0.450–4.500)

## 2020-02-06 MED ORDER — VANCOMYCIN HCL 125 MG PO CAPS: 125.0000 mg | ORAL_CAPSULE | Freq: Four times a day (QID) | ORAL | 0 refills | Status: AC

## 2020-02-06 NOTE — Progress Notes (Signed)
Spoke with daughter regarding results.  Unfortunately could not get through to [name].  Her line was busy when I attempted to call her.  Her stool study was positive for C. difficile.  I think this likely to be the cause of her diarrhea not IBS at this point.  I sent in vancomycin orally 125 mg 4 times daily for 2 weeks.  No renal adjustment is needed for oral vancomycin  She is aware of medication change, reasons for return.  I contacted her pharmacist informed to cancel the Xifaxan order and alerted her to the new prescription.  She has enough to get her through the weekend and will order the rest.  Co-pay is less than $4.

## 2020-02-06 NOTE — Telephone Encounter (Signed)
XIFAXAN Tablet Type of coverage approved: Prior Authorization This approval authorizes your coverage from 01/17/2020 - 02/20/2020, unless we notify you  otherwise, and as long as the following conditions apply: . you remain enrolled in our Medicare Part D prescription drug plan, . your physician or other prescriber continues to prescribe the medication for you, and . the medication continues to be safe for treating your condition.  Pharmacy aware.

## 2020-02-06 NOTE — Telephone Encounter (Signed)
PA in process/ww   (Key: [id number]) Xifaxan '550MG'$  tablets   Form [employer] Electronic PA Form [phone] NCPDP)

## 2020-02-09 ENCOUNTER — Other Ambulatory Visit: Payer: Self-pay | Admitting: Cardiovascular Disease

## 2020-02-09 NOTE — Telephone Encounter (Signed)
101f Scr 1.27 07/26/19, 60.3kg, lovw/croitoru 01/27/20. Pt requesting refill of 2.'5mg'$  eliquis when they actually qualify for '5mg'$ . Will roue to pharmd pool for final judgement

## 2020-02-10 LAB — CDIFF NAA+O+P+STOOL CULTURE
E coli, Shiga toxin Assay: NEGATIVE
Toxigenic C. Difficile by PCR: POSITIVE — AB

## 2020-02-11 LAB — FECAL FAT, QUALITATIVE
Fat Qual Neutral, Stl: NORMAL
Fat Qual Total, Stl: NORMAL

## 2020-02-11 MED ORDER — APIXABAN 2.5 MG PO TABS: 2.5000 mg | ORAL_TABLET | Freq: Two times a day (BID) | ORAL | 5 refills | Status: DC

## 2020-02-11 NOTE — Telephone Encounter (Signed)
Weight only 1 lb over threshold for dose reduction. Ok to continue on 2.'5mg'$  BID dosing which was initially changed 2 months ago at hospital discharge, refill sent in.

## 2020-02-18 DIAGNOSIS — N280 Ischemia and infarction of kidney: Secondary | ICD-10-CM | POA: Diagnosis not present

## 2020-02-18 DIAGNOSIS — N1832 Chronic kidney disease, stage 3b: Secondary | ICD-10-CM | POA: Diagnosis not present

## 2020-02-18 DIAGNOSIS — I5032 Chronic diastolic (congestive) heart failure: Secondary | ICD-10-CM | POA: Diagnosis not present

## 2020-02-18 DIAGNOSIS — I129 Hypertensive chronic kidney disease with stage 1 through stage 4 chronic kidney disease, or unspecified chronic kidney disease: Secondary | ICD-10-CM | POA: Diagnosis not present

## 2020-02-18 DIAGNOSIS — Z79899 Other long term (current) drug therapy: Secondary | ICD-10-CM | POA: Diagnosis not present

## 2020-02-18 DIAGNOSIS — N200 Calculus of kidney: Secondary | ICD-10-CM | POA: Diagnosis not present

## 2020-02-18 DIAGNOSIS — D638 Anemia in other chronic diseases classified elsewhere: Secondary | ICD-10-CM | POA: Diagnosis not present

## 2020-02-24 ENCOUNTER — Other Ambulatory Visit: Payer: Self-pay | Admitting: *Deleted

## 2020-02-24 DIAGNOSIS — M79676 Pain in unspecified toe(s): Secondary | ICD-10-CM | POA: Diagnosis not present

## 2020-02-24 DIAGNOSIS — L84 Corns and callosities: Secondary | ICD-10-CM | POA: Diagnosis not present

## 2020-02-24 DIAGNOSIS — I70203 Unspecified atherosclerosis of native arteries of extremities, bilateral legs: Secondary | ICD-10-CM | POA: Diagnosis not present

## 2020-02-24 DIAGNOSIS — B351 Tinea unguium: Secondary | ICD-10-CM | POA: Diagnosis not present

## 2020-02-24 MED ORDER — ROSUVASTATIN CALCIUM 10 MG PO TABS: 10.0000 mg | ORAL_TABLET | Freq: Every day | ORAL | 1 refills | Status: DC

## 2020-02-26 ENCOUNTER — Encounter (HOSPITAL_COMMUNITY): Payer: Self-pay | Admitting: Emergency Medicine

## 2020-02-26 ENCOUNTER — Ambulatory Visit (INDEPENDENT_AMBULATORY_CARE_PROVIDER_SITE_OTHER): Payer: Medicare Other | Admitting: Family Medicine

## 2020-02-26 ENCOUNTER — Emergency Department (HOSPITAL_COMMUNITY)
Admission: EM | Admit: 2020-02-26 | Discharge: 2020-02-27 | Disposition: A | Discharge status: Home / Self Care | Payer: Medicare Other | Attending: Emergency Medicine | Admitting: Emergency Medicine

## 2020-02-26 DIAGNOSIS — Z79899 Other long term (current) drug therapy: Secondary | ICD-10-CM | POA: Diagnosis not present

## 2020-02-26 DIAGNOSIS — Z5321 Procedure and treatment not carried out due to patient leaving prior to being seen by health care provider: Secondary | ICD-10-CM | POA: Insufficient documentation

## 2020-02-26 DIAGNOSIS — Z95810 Presence of automatic (implantable) cardiac defibrillator: Secondary | ICD-10-CM | POA: Diagnosis not present

## 2020-02-26 DIAGNOSIS — E1122 Type 2 diabetes mellitus with diabetic chronic kidney disease: Secondary | ICD-10-CM | POA: Insufficient documentation

## 2020-02-26 DIAGNOSIS — N178 Other acute kidney failure: Secondary | ICD-10-CM | POA: Insufficient documentation

## 2020-02-26 DIAGNOSIS — Z049 Encounter for examination and observation for unspecified reason: Secondary | ICD-10-CM | POA: Diagnosis not present

## 2020-02-26 DIAGNOSIS — R1084 Generalized abdominal pain: Secondary | ICD-10-CM | POA: Diagnosis not present

## 2020-02-26 DIAGNOSIS — Z86718 Personal history of other venous thrombosis and embolism: Secondary | ICD-10-CM | POA: Diagnosis not present

## 2020-02-26 DIAGNOSIS — Z7901 Long term (current) use of anticoagulants: Secondary | ICD-10-CM | POA: Insufficient documentation

## 2020-02-26 DIAGNOSIS — I2510 Atherosclerotic heart disease of native coronary artery without angina pectoris: Secondary | ICD-10-CM | POA: Insufficient documentation

## 2020-02-26 DIAGNOSIS — N200 Calculus of kidney: Secondary | ICD-10-CM | POA: Diagnosis not present

## 2020-02-26 DIAGNOSIS — K5900 Constipation, unspecified: Secondary | ICD-10-CM | POA: Insufficient documentation

## 2020-02-26 DIAGNOSIS — D649 Anemia, unspecified: Secondary | ICD-10-CM | POA: Insufficient documentation

## 2020-02-26 DIAGNOSIS — E871 Hypo-osmolality and hyponatremia: Secondary | ICD-10-CM | POA: Insufficient documentation

## 2020-02-26 DIAGNOSIS — R7401 Elevation of levels of liver transaminase levels: Secondary | ICD-10-CM | POA: Diagnosis not present

## 2020-02-26 DIAGNOSIS — N132 Hydronephrosis with renal and ureteral calculous obstruction: Secondary | ICD-10-CM | POA: Insufficient documentation

## 2020-02-26 DIAGNOSIS — E039 Hypothyroidism, unspecified: Secondary | ICD-10-CM | POA: Insufficient documentation

## 2020-02-26 DIAGNOSIS — N138 Other obstructive and reflux uropathy: Secondary | ICD-10-CM | POA: Diagnosis not present

## 2020-02-26 DIAGNOSIS — N261 Atrophy of kidney (terminal): Secondary | ICD-10-CM | POA: Diagnosis not present

## 2020-02-26 DIAGNOSIS — I10 Essential (primary) hypertension: Secondary | ICD-10-CM | POA: Diagnosis not present

## 2020-02-26 DIAGNOSIS — N289 Disorder of kidney and ureter, unspecified: Secondary | ICD-10-CM | POA: Diagnosis not present

## 2020-02-26 DIAGNOSIS — R109 Unspecified abdominal pain: Secondary | ICD-10-CM | POA: Diagnosis present

## 2020-02-26 DIAGNOSIS — A09 Infectious gastroenteritis and colitis, unspecified: Secondary | ICD-10-CM | POA: Diagnosis not present

## 2020-02-26 LAB — COMPREHENSIVE METABOLIC PANEL
ALT: 60 U/L — ABNORMAL HIGH (ref 0–44)
AST: 78 U/L — ABNORMAL HIGH (ref 15–41)
Albumin: 3.4 g/dL — ABNORMAL LOW (ref 3.5–5.0)
Alkaline Phosphatase: 70 U/L (ref 38–126)
Anion gap: 10 (ref 5–15)
BUN: 15 mg/dL (ref 8–23)
CO2: 28 mmol/L (ref 22–32)
Calcium: 9 mg/dL (ref 8.9–10.3)
Chloride: 95 mmol/L — ABNORMAL LOW (ref 98–111)
Creatinine, Ser: 1.37 mg/dL — ABNORMAL HIGH (ref 0.44–1.00)
GFR, Estimated: 38 mL/min — ABNORMAL LOW (ref >60)
Glucose, Bld: 117 mg/dL — ABNORMAL HIGH (ref 70–99)
Potassium: 3.7 mmol/L (ref 3.5–5.1)
Sodium: 133 mmol/L — ABNORMAL LOW (ref 135–145)
Total Bilirubin: 0.6 mg/dL (ref 0.3–1.2)
Total Protein: 6.7 g/dL (ref 6.5–8.1)

## 2020-02-26 LAB — CBC
HCT: 34.1 % — ABNORMAL LOW (ref 36.0–46.0)
Hemoglobin: 11.4 g/dL — ABNORMAL LOW (ref 12.0–15.0)
MCH: 31.8 pg (ref 26.0–34.0)
MCHC: 33.4 g/dL (ref 30.0–36.0)
MCV: 95.3 fL (ref 80.0–100.0)
Platelets: 263 10*3/uL (ref 150–400)
RBC: 3.58 MIL/uL — ABNORMAL LOW (ref 3.87–5.11)
RDW: 14 % (ref 11.5–15.5)
WBC: 5.5 10*3/uL (ref 4.0–10.5)
nRBC: 0 % (ref 0.0–0.2)

## 2020-02-26 LAB — LIPASE, BLOOD: Lipase: 42 U/L (ref 11–51)

## 2020-02-26 NOTE — Progress Notes (Signed)
Patient called EMS and they were on the way so she is going to go with them, she will let us know afterwards.  She says it has been worsening [place] when she called EMS

## 2020-02-26 NOTE — ED Triage Notes (Signed)
Patient BIB [facility] for complaint of abdominal pain x2 weeks. Patient states she has a bowel infection for which she was prescribed abx for two weeks ago but the pain has persisted, also reports constipation.

## 2020-02-27 ENCOUNTER — Emergency Department (HOSPITAL_COMMUNITY): Payer: Medicare Other

## 2020-02-27 ENCOUNTER — Emergency Department (HOSPITAL_COMMUNITY)
Admission: EM | Admit: 2020-02-27 | Discharge: 2020-02-27 | Disposition: A | Discharge status: Home / Self Care | Payer: Medicare Other | Source: Home / Self Care | Attending: Emergency Medicine | Admitting: Emergency Medicine

## 2020-02-27 ENCOUNTER — Other Ambulatory Visit: Payer: Self-pay

## 2020-02-27 ENCOUNTER — Encounter (HOSPITAL_COMMUNITY): Payer: Self-pay

## 2020-02-27 DIAGNOSIS — I129 Hypertensive chronic kidney disease with stage 1 through stage 4 chronic kidney disease, or unspecified chronic kidney disease: Secondary | ICD-10-CM | POA: Insufficient documentation

## 2020-02-27 DIAGNOSIS — Z79899 Other long term (current) drug therapy: Secondary | ICD-10-CM | POA: Insufficient documentation

## 2020-02-27 DIAGNOSIS — R7401 Elevation of levels of liver transaminase levels: Secondary | ICD-10-CM | POA: Insufficient documentation

## 2020-02-27 DIAGNOSIS — N183 Chronic kidney disease, stage 3 unspecified: Secondary | ICD-10-CM | POA: Insufficient documentation

## 2020-02-27 DIAGNOSIS — D649 Anemia, unspecified: Secondary | ICD-10-CM | POA: Insufficient documentation

## 2020-02-27 DIAGNOSIS — E871 Hypo-osmolality and hyponatremia: Secondary | ICD-10-CM | POA: Insufficient documentation

## 2020-02-27 DIAGNOSIS — I10 Essential (primary) hypertension: Secondary | ICD-10-CM | POA: Diagnosis not present

## 2020-02-27 DIAGNOSIS — I2510 Atherosclerotic heart disease of native coronary artery without angina pectoris: Secondary | ICD-10-CM | POA: Insufficient documentation

## 2020-02-27 DIAGNOSIS — Z91040 Latex allergy status: Secondary | ICD-10-CM | POA: Insufficient documentation

## 2020-02-27 DIAGNOSIS — Z8616 Personal history of COVID-19: Secondary | ICD-10-CM | POA: Insufficient documentation

## 2020-02-27 DIAGNOSIS — N289 Disorder of kidney and ureter, unspecified: Secondary | ICD-10-CM

## 2020-02-27 DIAGNOSIS — Z95810 Presence of automatic (implantable) cardiac defibrillator: Secondary | ICD-10-CM | POA: Insufficient documentation

## 2020-02-27 DIAGNOSIS — N132 Hydronephrosis with renal and ureteral calculous obstruction: Secondary | ICD-10-CM | POA: Insufficient documentation

## 2020-02-27 DIAGNOSIS — E039 Hypothyroidism, unspecified: Secondary | ICD-10-CM | POA: Insufficient documentation

## 2020-02-27 DIAGNOSIS — Z7901 Long term (current) use of anticoagulants: Secondary | ICD-10-CM | POA: Insufficient documentation

## 2020-02-27 DIAGNOSIS — N178 Other acute kidney failure: Secondary | ICD-10-CM | POA: Insufficient documentation

## 2020-02-27 DIAGNOSIS — N23 Unspecified renal colic: Secondary | ICD-10-CM

## 2020-02-27 DIAGNOSIS — N261 Atrophy of kidney (terminal): Secondary | ICD-10-CM | POA: Diagnosis not present

## 2020-02-27 DIAGNOSIS — E1122 Type 2 diabetes mellitus with diabetic chronic kidney disease: Secondary | ICD-10-CM | POA: Insufficient documentation

## 2020-02-27 DIAGNOSIS — A09 Infectious gastroenteritis and colitis, unspecified: Secondary | ICD-10-CM | POA: Diagnosis not present

## 2020-02-27 DIAGNOSIS — Z86718 Personal history of other venous thrombosis and embolism: Secondary | ICD-10-CM | POA: Insufficient documentation

## 2020-02-27 DIAGNOSIS — N201 Calculus of ureter: Secondary | ICD-10-CM

## 2020-02-27 DIAGNOSIS — K5900 Constipation, unspecified: Secondary | ICD-10-CM | POA: Diagnosis not present

## 2020-02-27 DIAGNOSIS — N200 Calculus of kidney: Secondary | ICD-10-CM | POA: Diagnosis not present

## 2020-02-27 DIAGNOSIS — N138 Other obstructive and reflux uropathy: Secondary | ICD-10-CM | POA: Diagnosis not present

## 2020-02-27 MED ORDER — DIPHENHYDRAMINE HCL 50 MG/ML IJ SOLN: 50.0000 mg | Freq: Once | INTRAMUSCULAR | Status: DC

## 2020-02-27 MED ORDER — TAMSULOSIN HCL 0.4 MG PO CAPS: 0.4000 mg | ORAL_CAPSULE | Freq: Every day | ORAL | 0 refills | Status: DC

## 2020-02-27 MED ORDER — LACTATED RINGERS IV BOLUS
1000.0000 mL | Freq: Once | INTRAVENOUS | Status: AC
  Administered 2020-02-27: 1000 mL via INTRAVENOUS

## 2020-02-27 MED ORDER — HYDROCODONE-ACETAMINOPHEN 5-325 MG PO TABS: 1.0000 | ORAL_TABLET | ORAL | 0 refills | Status: DC | PRN

## 2020-02-27 MED ORDER — IOHEXOL 300 MG/ML  SOLN
100.0000 mL | Freq: Once | INTRAMUSCULAR | Status: AC | PRN
  Administered 2020-02-27: 75 mL via INTRAVENOUS

## 2020-02-27 MED ORDER — ONDANSETRON HCL 4 MG/2ML IJ SOLN
4.0000 mg | Freq: Once | INTRAMUSCULAR | Status: DC
  Filled 2020-02-27: qty 2

## 2020-02-27 MED ORDER — HYDROCORTISONE NA SUCCINATE PF 250 MG IJ SOLR
200.0000 mg | Freq: Once | INTRAMUSCULAR | Status: DC
  Filled 2020-02-27: qty 200

## 2020-02-27 MED ORDER — HYDROCODONE-ACETAMINOPHEN 5-325 MG PO TABS
1.0000 | ORAL_TABLET | ORAL | 0 refills | Status: DC | PRN
Start: 2020-02-27 — End: 2020-03-10

## 2020-02-27 MED ORDER — MORPHINE SULFATE (PF) 4 MG/ML IV SOLN
4.0000 mg | Freq: Once | INTRAVENOUS | Status: AC
Start: 2020-02-27 — End: 2020-02-27
  Administered 2020-02-27: 4 mg via INTRAVENOUS
  Filled 2020-02-27: qty 1

## 2020-02-27 MED ORDER — ONDANSETRON HCL 4 MG PO TABS: 4.0000 mg | ORAL_TABLET | Freq: Four times a day (QID) | ORAL | 0 refills | Status: DC | PRN

## 2020-02-27 MED ORDER — DIPHENHYDRAMINE HCL 25 MG PO CAPS: 50.0000 mg | ORAL_CAPSULE | Freq: Once | ORAL | Status: DC

## 2020-02-27 NOTE — ED Triage Notes (Signed)
Pt brought in by family for abd/flank pain that started about 2 weeks ago, pt says she was diagnosed with bowel infection which she was prescribed abx for and has taken it all, but pain still persists. Pt reports constipation as well.

## 2020-02-27 NOTE — Discharge Instructions (Addendum)
Your pain is from a large kidney stone on the right side.  This is large enough that it probably will need help to have it passed.  Please contact the urology office to make a follow-up appointment as soon as possible.  In the meantime, take tamsulosin every day and take hydrocodone-acetaminophen as needed for pain.  If you start running a fever, return to the emergency department immediately.  That might indicate a severe complication of the kidney stones.  Also, return if pain is not being adequately controlled or if you are vomiting and not able to hold your medication down.

## 2020-02-27 NOTE — ED Notes (Signed)
Patient tolerated po

## 2020-02-27 NOTE — ED Notes (Signed)
Pt son taking pt home at this time. No longer waiting. IV removed.

## 2020-02-27 NOTE — ED Provider Notes (Signed)
[facility] EMERGENCY DEPARTMENT Provider Note   CSN: [id number] Arrival date & time: 02/27/20  0053   History Chief Complaint  Patient presents with  . Flank Pain    [name] is a 87 y.o. female.  The history is provided by the patient.  Flank Pain  She has history of hypertension, diabetes, hyperlipidemia, chronic kidney disease stage III, coronary artery disease, atrial fibrillation anticoagulated on apixaban, implanted cardiac defibrillator and comes in with upper abdominal pain for the last several weeks.  Pain has settled to where it is mostly in the right upper quadrant.  Pain is severe and she rates it at 9/10.  It is worse after eating, but nothing makes it better.  She denies nausea but states that she has not been eating because it hurts when she eats and she has basically been surviving on Ensure.  She normally has problems with diarrhea, but bowel movements have become much less frequent and she now has a strain of the bowel movement.  She took a laxative last night which only allowed her to have a very small bowel movement.  Her primary care provider had diagnosed her with C. difficile and gave her a course of vancomycin.  That has not affected her pain at all.  She had gone to the emergency department at [facility] earlier today but left there because of a very long wait time.  Past Medical History:  Diagnosis Date  . AICD (automatic cardioverter/defibrillator) present   . Allergy    SESONAL  . Anxiety   . ARTHRITIS   . Arthritis   . Atrial fibrillation ([place])   . CAD (coronary artery disease)    a. history of cardiac arrest 1993;  b. s/p LAD/LCX stenting in 2003;  c. 07/2011 Cath: LM nl, LAD patent stent, D1 80ost, LCX patent stent, RCA min irregs;  d. 04/2012 MV: EF 66%, no ishcemia;  e. 12/2013 Echo: Ef 55%, no rwma, Gr 1 DD, triv AI/MR, mildly dil LA;  f. 12/2013 Lexi MV: intermediate risk - apical ischemia and inf/infsept fixed defect, ? artifactual.   . CAD in native artery 12/31/2013   Previous stents to LAD and Ramus patent on cath today 12/31/13 also There is severe disease in the ostial first diagonal which is unchanged from most recent cardiac catheterization. The right coronary artery could not be engaged selectively but nonselective angiography showed no significant disease in the proximal and midsegment.     . Cataract    DENIES  . Chronic back pain   . Chronic sinus bradycardia   . CKD (chronic kidney disease), stage III ([place])   . Clotting disorder (HCC)    DVT  . COLITIS 12/02/2007   Qualifier: Diagnosis of  By: Nils Pyle CMA ([place]), [name]    . Diabetes mellitus without complication ([place])    DENIES  . Diverticulosis of colon (without mention of hemorrhage) 2007   Colonoscopy   . Esophageal stricture    a. 2012 s/p dil.  . Esophagitis, unspecified    a. 2012 EGD  . EXTERNAL HEMORRHOIDS   . GERD (gastroesophageal reflux disease)    omeprazole  . H/O hiatal hernia   . Hiatal hernia    a. 2012 EGD.  Marland Kitchen History of DVT in the past, not on Coumadin now    left  leg  . HYPERCHOLESTEROLEMIA   . Hypertension   . ICD (implantable cardiac defibrillator) in place    a. s/p initial ICD in 1993 in  setting of cardiac arrest;  b. 01/2007 gen change: Guidant T135 Vitality DS VR single lead ICD.  . Macular degeneration    gets injecton in eye every 5 weeks- last injection - 05/03/2013   . Myocardial infarction ([place]) 1993  . Near syncope 05/22/2015  . Nephrolithiasis, just saw Dr [name]   . Orthostatic hypotension   . Peripheral vascular disease ([place])    ???  . RECTAL BLEEDING 12/03/2007   Qualifier: Diagnosis of  By: Sharlett Iles MD Byrd Hesselbach   . Unspecified gastritis and gastroduodenitis without mention of hemorrhage    a. 2003 EGD->not noted on 2012 EGD.  Marland Kitchen Unstable angina, neg MI, cath stable.maybe GI 12/30/2013    Patient Active Problem List   Diagnosis Date Noted  . Malnutrition of moderate degree 12/26/2019  .  Prolonged QT interval 12/23/2019  . Acute lower UTI 12/23/2019  . Constipation 11/27/2019  . Diarrhea 11/27/2019  . Lesion of pancreas 11/26/2019  . Fibromuscular dysplasia of right renal artery ([place]) 11/26/2019  . Left renal artery stenosis ([place]) 11/26/2019  . RUQ pain   . Abnormal CT of the abdomen   . Vertigo 11/15/2019  . Advanced care planning/counseling discussion 11/15/2019  . CAD (coronary artery disease)   . Hypothyroidism   . Hyponatremia   . Volume depletion   . TSH elevation 08/01/2019  . Hospital discharge follow-up 08/01/2019  . Exudative age-related macular degeneration of right eye with active choroidal neovascularization ([place]) 04/30/2019  . Retinal hemorrhage of right eye 04/30/2019  . Advanced nonexudative age-related macular degeneration of left eye with subfoveal involvement 04/30/2019  . Exudative age-related macular degeneration of left eye with inactive choroidal neovascularization ([place]) 04/30/2019  . Advanced nonexudative age-related macular degeneration of right eye without subfoveal involvement 04/30/2019  . Posterior vitreous detachment of both eyes 04/30/2019  . Secondary hypercoagulable state ([place]) 03/11/2019  . Persistent atrial fibrillation ([place])   . Proctitis 01/09/2019  . Atrial fibrillation, chronic ([place]) 01/09/2019  . Hyperlipidemia 01/09/2019  . Nausea and vomiting 01/09/2019  . Hyperglycemia 01/09/2019  . COVID-19 virus infection 12/28/2018  . Chronic diarrhea 09/18/2018  . Abdominal pain 09/18/2018  . Osteoarthritis of right knee 03/29/2018  . GERD (gastroesophageal reflux disease)   . Diarrhea of presumed infectious origin 02/06/2018  . Acute kidney injury superimposed on CKD ([place]) 02/05/2018  . Essential hypertension 12/21/2017  . Degenerative scoliosis 02/19/2017  . Burst fracture of lumbar vertebra ([place]) 02/19/2017  . History of drug-induced prolonged QT interval with torsade de pointes 11/01/2016  . Iron deficiency anemia 10/30/2016   . Long term current use of anticoagulant 02/17/2016  . Near syncope 05/22/2015  . Diabetes mellitus type 2, diet-controlled ([place]) 09/14/2014  . ICD (implantable cardioverter-defibrillator) battery depletion 01/20/2014  . Abnormal nuclear stress test, 12/30/13 12/31/2013  . Coronary artery disease involving native coronary artery of native heart without angina pectoris 12/31/2013  . Paroxysmal atrial fibrillation, chads2 Vasc2 score of 5, on eliquis 10/16/2013  . Catecholaminergic polymorphic ventricular tachycardia ([place]) 10/16/2013  . Ureteral stone with hydronephrosis 05/20/2013  . Metabolic syndrome [id number]  . Orthostatic hypotension 07/27/2011  . Chest pain 07/25/2011  . Weakness 07/25/2011    Class: Acute  . Chronic sinus bradycardia 07/25/2011  . ICD (implantable cardioverter-defibrillator) in place 07/25/2011  . CKD (chronic kidney disease) stage 3, GFR 30-59 ml/min (HCC) 07/25/2011    Class: Chronic  . History of DVT in the past, on eliquis now 07/25/2011    Class: History of  . Nephrolithiasis, just  saw Dr [name] 07/25/2011    Class: History of  . DYSPHAGIA 03/24/2010  . Chronic ischemic heart disease 12/03/2007  . Irritable bowel syndrome with diarrhea 12/03/2007  . EPIGASTRIC PAIN 12/03/2007  . Hyperlipidemia associated with type 2 diabetes mellitus ([place]) 12/02/2007    Class: History of  . EXTERNAL HEMORRHOIDS 12/02/2007    Class: History of  . ESOPHAGITIS 12/02/2007  . GASTRITIS 12/02/2007  . DIVERTICULOSIS, COLON 12/02/2007  . ARTHRITIS 12/02/2007    Class: Chronic    Past Surgical History:  Procedure Laterality Date  . BREAST BIOPSY Left 11/2013  . CARDIAC CATHETERIZATION    . CARDIAC CATHETERIZATION  01/01/15   patent stents -there is severe disease in ostial 1st diag but without change.   Marland Kitchen CARDIAC DEFIBRILLATOR PLACEMENT  02/05/2007   Guidant  . CARDIOVASCULAR STRESS TEST  12/30/2013   abnormal  . CARDIOVERSION N/A 02/13/2019   Procedure:  CARDIOVERSION;  Surgeon: Sanda Klein, MD;  Location: [place];  Service: Cardiovascular;  Laterality: N/A;  . CATARACT EXTRACTION Right    Dr. Katy Fitch  . COLONOSCOPY    . coronary stents     . CYSTOSCOPY WITH URETEROSCOPY Right 05/20/2013   Procedure: CYSTOSCOPY, RIGHT URETEROSCOPY STONE EXTRACTION, Insertion of right DOUBLE J STENT ;  Surgeon: Irine Seal, MD;  Location: [facility];  Service: Urology;  Laterality: Right;  . CYSTOSCOPY WITH URETEROSCOPY AND STENT PLACEMENT N/A 06/03/2013   Procedure: SECOND LOOK CYSTOSCOPY WITH URETEROSCOPY  HOLMIUM LASER LITHO AND STONE EXTRACTION Sammie Bench ;  Surgeon: Malka So, MD;  Location: [facility];  Service: Urology;  Laterality: N/A;  . ESOPHAGOGASTRODUODENOSCOPY (EGD) WITH PROPOFOL N/A 11/20/2019   Procedure: ESOPHAGOGASTRODUODENOSCOPY (EGD) WITH PROPOFOL;  Surgeon: Daneil Dolin, MD;  [facility];  Service: Endoscopy;  Laterality: N/A;  . GIVENS CAPSULE STUDY N/A 10/30/2016   Procedure: GIVENS CAPSULE STUDY;  Surgeon: Irene Shipper, MD;  Location: [place];  Service: Endoscopy;  Laterality: N/A;  NEEDS TO BE ADMITTED FOR OBSERVATION PT. HAS DEFIB  . HEMORROIDECTOMY  80's  . HOLMIUM LASER APPLICATION Right [id number]   Procedure: HOLMIUM LASER APPLICATION;  Surgeon: Irine Seal, MD;  Location: [facility];  Service: Urology;  Laterality: Right;  . HYSTEROSCOPY WITH D & C  09/02/2010   Procedure: DILATATION AND CURETTAGE (D&C) /HYSTEROSCOPY;  Surgeon: Margarette Asal;  Location: [facility];  Service: Gynecology;  Laterality: N/A;  Dilation and Curettage with Hysteroscopy and Polypectomy  . IMPLANTABLE CARDIOVERTER DEFIBRILLATOR (ICD) GENERATOR CHANGE N/A 02/10/2014   Procedure: ICD GENERATOR CHANGE;  Surgeon: Sanda Klein, MD;  Location: [place] C[facility] LAB;  Service: Cardiovascular;  Laterality: N/A;  . LEFT HEART CATHETERIZATION WITH CORONARY ANGIOGRAM N/A 07/28/2011   Procedure: LEFT HEART CATHETERIZATION WITH CORONARY ANGIOGRAM;  Surgeon: Lorretta Harp, MD;  Location: [facility] C[facility] LAB;  Service: Cardiovascular;  Laterality: N/A;  . LEFT HEART CATHETERIZATION WITH CORONARY ANGIOGRAM N/A 12/31/2013   Procedure: LEFT HEART CATHETERIZATION WITH CORONARY ANGIOGRAM;  Surgeon: Wellington Hampshire, MD;  Location: [place] CATH LAB;  Service: Cardiovascular;  Laterality: N/A;     OB History   No obstetric history on file.     Family History  Problem Relation Age of Onset  . Stroke Mother   . Other Mother        brain tumor  . Other Father        MI  . Heart attack Father   . Stroke Sister   . Macular degeneration Sister   .  Diabetes Daughter   . Cancer Daughter        ovarian  . Colon polyps Daughter   . Atrial fibrillation Sister   . Hyperlipidemia Sister   . Osteoporosis Sister   . Stroke Sister   . Uterine cancer Sister   . Heart attack Brother   . Heart disease Brother   . Asthma Brother   . Breast cancer Neg Hx     Social History   Tobacco Use  . Smoking status: Never Smoker  . Smokeless tobacco: Never Used  Vaping Use  . Vaping Use: Never used  Substance Use Topics  . Alcohol use: No  . Drug use: No    Home Medications Prior to Admission medications   Medication Sig Start Date End Date Taking? Authorizing Provider  amiodarone (PACERONE) 200 MG tablet TAKE 1 TABLET DAILY 11/24/19   Fenton, Clint R, PA  apixaban (ELIQUIS) 2.5 MG TABS tablet Take 1 tablet (2.5 mg total) by mouth 2 (two) times daily. 02/11/20   Croitoru, Mihai, MD  carboxymethylcellulose (REFRESH PLUS) 0.5 % SOLN Place 1 drop into both eyes 3 (three) times daily as needed (dry/irritated eyes.).    [provider]  Cholecalciferol (VITAMIN D) 50 MCG (2000 UT) CAPS Take 2,000 Units by mouth daily.     [provider]  diphenoxylate-atropine (LOMOTIL) 2.5-0.025 MG tablet TAKE 1 TABLET EVERY 8 HOURS AS NEEDED FOR DIARRHEA 03/25/19   Ronnie Doss M, DO  feeding supplement (ENSURE ENLIVE / ENSURE PLUS) LIQD Take 237 mLs by mouth 2 (two) times  daily between meals. 12/26/19   Mikhail, Velta Addison, DO  hydrALAZINE (APRESOLINE) 25 MG tablet Take 25 mg by mouth 3 (three) times daily. 01/16/20   [provider]  isosorbide mononitrate (IMDUR) 30 MG 24 hr tablet TAKE ONE HALF (1/2) TABLET DAILY 09/23/19   Croitoru, Mihai, MD  levothyroxine (SYNTHROID) 100 MCG tablet Take 1 tablet (100 mcg total) by mouth daily. 01/27/20   Croitoru, Mihai, MD  loratadine (CLARITIN) 10 MG tablet Take 1 tablet (10 mg total) by mouth daily. (for itching) 01/20/19   [name], DO  meclizine (ANTIVERT) 25 MG tablet Take 25 mg by mouth 3 (three) times daily as needed for dizziness or nausea.  07/22/19   [provider]  Multiple Vitamins-Minerals (CENTRUM SILVER ULTRA WOMENS PO) Take 1 tablet by mouth daily.    [provider]  nitroGLYCERIN (NITROSTAT) 0.4 MG SL tablet Place 1 tablet (0.4 mg total) under the tongue every 5 (five) minutes as needed for chest pain. 12/20/18   Claretta Fraise, MD  pantoprazole (PROTONIX) 40 MG tablet Take 1 tablet (40 mg total) by mouth every evening. 02/21/19   Ronnie Doss M, DO  PARoxetine (PAXIL) 20 MG tablet TAKE (1) TABLET DAILY IN THE MORNING. 12/15/19   Ronnie Doss M, DO  rosuvastatin (CRESTOR) 10 MG tablet Take 1 tablet (10 mg total) by mouth daily. 02/24/20   [name], DO    Allergies    Sotalol, Bactrim [sulfamethoxazole-trimethoprim], Ciprofloxacin, Tramadol, Actos [pioglitazone], Adhesive [tape], Atorvastatin, Contrast media [iodinated diagnostic agents], Latex, Pedi-pre tape spray [wound dressing adhesive], and Sulfa antibiotics  Review of Systems   Review of Systems  Genitourinary: Positive for flank pain.  All other systems reviewed and are negative.   Physical Exam Updated Vital Signs BP (!) 164/50 (BP Location: Right Arm)   Pulse 75   Temp 97.9 F (36.6 C) (Oral)   Resp 17   Ht '5\' 3"'$  (1.6 m)  Wt 60.3 kg   SpO2 96%   BMI 23.55 kg/m   Physical Exam Vitals and  nursing note reviewed.   87 year old female, resting comfortably and in no acute distress. Vital signs are significant for elevated blood pressure. Oxygen saturation is 96%, which is normal. Head is normocephalic and atraumatic. PERRLA, EOMI. Oropharynx is clear. Neck is nontender and supple without adenopathy or JVD. Back is nontender and there is no CVA tenderness. Lungs are clear without rales, wheezes, or rhonchi. Chest is nontender. Heart has regular rate and rhythm without murmur. Abdomen is soft, flat, with moderate tenderness across the upper abdomen.  There is no rebound or guarding.  There are no masses or hepatosplenomegaly and peristalsis is hypoactive. Extremities have no cyanosis or edema, full range of motion is present. Skin is warm and dry without rash. Neurologic: Mental status is normal, cranial nerves are intact, there are no motor or sensory deficits.  ED Results / Procedures / Treatments   Labs (all labs ordered are listed, but only abnormal results are displayed) Labs Reviewed - No data to display  EKG EKG Interpretation  Date/Time:  Friday February 27 2020 02:57:32 EST Ventricular Rate:  71 PR Interval:    QRS Duration: 174 QT Interval:  460 QTC Calculation: 500 R Axis:   -10 Text Interpretation: Sinus rhythm Prolonged PR interval Left bundle branch block When compared with ECG of 12/24/2019, No significant change was found Confirmed by Delora Fuel ([id number]) on 02/27/2020 3:04:58 AM   Radiology CT ABDOMEN PELVIS W CONTRAST  Result Date: 02/27/2020 CLINICAL DATA:  87 year old female with abdomen and flank pain x2 weeks. Diagnosed with bowel infection and finished antibiotics but symptoms persist. EXAM: CT ABDOMEN AND PELVIS WITH CONTRAST TECHNIQUE: Multidetector CT imaging of the abdomen and pelvis was performed using the standard protocol following bolus administration of intravenous contrast. CONTRAST:  63m OMNIPAQUE IOHEXOL 300 MG/ML  SOLN COMPARISON:  CTA  abdomen and Pelvis 11/18/2019 and earlier. FINDINGS: Lower chest: Stable cardiomegaly, cardiac pacer/AICD leads, and mild respiratory motion artifact in the lungs. No pericardial or pleural effusion. Numerous ventral lower chest wall and upper abdominal wall electrical leads redemonstrated and stable. Hepatobiliary: Negative liver and gallbladder. Pancreas: Negative. Spleen: Negative. Adrenals/Urinary Tract: Normal adrenal glands. Chronic left renal atrophy with superimposed left nephrolithiasis. Still there is left renal enhancement and contrast excretion on the delayed images. Negative left ureter. New moderate to severe right hydronephrosis with right side delayed nephrogram and renal contrast excretion. Right hydroureter is bulky to the level of an obstructing 7 mm stone in the right ureter corresponding to the L5 level, about 6 cm distal to the right ureteropelvic junction (coronal image 40 and series 2, image 52. Distal to the calculus the right ureter appears decompressed. Right perinephric stranding and fluid suggests forniceal rupture. Additional right intrarenal calculi measuring up to 4 mm. Urinary bladder is distended but otherwise unremarkable. Chronic pelvic phleboliths. Stomach/Bowel: Retained fluid more so than stool in the large bowel today. Highly redundant sigmoid colon and junction with the descending colon again noted. Oral contrast has reached the hepatic flexure. Transverse colon also redundant. Presumed peristalsis of the ascending colon on coronal image 38. No large bowel wall thickening or pericolonic inflammation. Negative terminal ileum. Diminutive or absent appendix. No dilated small bowel. Stomach is mildly distended with contrast and air. No free air. No free fluid. Vascular/Lymphatic: Aortoiliac calcified atherosclerosis. Major arterial structures remain patent. Portal venous system is patent. No lymphadenopathy. Reproductive: Stable, negative. Other: No  pelvic free fluid.  Musculoskeletal: Osteopenia. Flowing interbody ankylosis in the visible thoracic spine. Degenerative lumbar scoliosis. Advanced chronic mid lumbar disc and endplate degeneration. Stable mild T1 superior endplate deformity. No acute osseous abnormality identified. IMPRESSION: 1. Severe acute obstructive uropathy on the right with a 7 mm obstructing stone in the mid right ureter, about 6 cm distal to the right UPJ. Suspected right forniceal rupture. 2. Underlying bilateral nephrolithiasis, chronic left renal atrophy. 3. Highly redundant large bowel, with fluid throughout the colon suggesting diarrhea. But no focal bowel inflammation or evidence of bowel obstruction. 4. Aortic Atherosclerosis (ICD10-I70.0). Electronically Signed   By: Genevie Ann M.D.   On: 02/27/2020 04:46    Procedures Procedures   Medications Ordered in ED Medications - No data to display  ED Course  I have reviewed the triage vital signs and the nursing notes.  Pertinent labs & imaging results that were available during my care of the patient were reviewed by me and considered in my medical decision making (see chart for details).  MDM Rules/Calculators/A&P Abdominal pain of uncertain cause.  Consider biliary tract disease, pancreatitis, diverticulitis, GERD.  Old records are reviewed, and she did have labs drawn at [facility] earlier today and results are back.  She has mild hyponatremia, mild renal insufficiency, normal WBC.  There is slight elevation of ALT and AST which are both slightly worse than they were on 12/20.  WBC is normal.  She has mild anemia which is about at her baseline.  Order for oral vancomycin was given on 02/06/2020 because of positive stool C. difficile PCR.  Right upper quadrant ultrasound on 11/21/2019 showed no gallstones, probable fatty liver.  CT angiogram of abdomen and pelvis on 11/18/2019 was significant for renal calculi and an 41m cyst in the pancreatic head.  Do not need to repeat labs, but will  check urinalysis and send for CT of abdomen and pelvis.  In the meantime, she is given IV fluids.  CT scan here shows 7 mm obstructing left right ureteral calculus with significant hydronephrosis.  This apparently is what is causing her pain.  She had good relief of pain with morphine.  She is discharged with prescriptions for tamsulosin, ondansetron, hydrocodone-acetaminophen and she is referred to urology for follow-up.  Strict return precautions are given.  Final Clinical Impression(s) / ED Diagnoses Final diagnoses:  Ureteral colic  Ureterolithiasis  Renal insufficiency  Elevated transaminase level  Normochromic normocytic anemia  Hyponatremia    Rx / DC Orders ED Discharge Orders         Ordered    tamsulosin (FLOMAX) 0.4 MG CAPS capsule  Daily        02/27/20 0502    HYDROcodone-acetaminophen (NORCO) 5-325 MG tablet  Every 4 hours PRN        02/27/20 0502    HYDROcodone-acetaminophen (NORCO) 5-325 MG tablet  Every 4 hours PRN        02/27/20 0502    ondansetron (ZOFRAN) 4 MG tablet  Every 6 hours PRN        02/27/20 [id number]          GDelora Fuel MD [id number] [phone]

## 2020-03-01 MED FILL — Hydrocodone-Acetaminophen Tab 5-325 MG: ORAL | Qty: 6 | Status: AC

## 2020-03-03 ENCOUNTER — Other Ambulatory Visit: Payer: Self-pay

## 2020-03-03 ENCOUNTER — Other Ambulatory Visit (HOSPITAL_COMMUNITY)
Admission: RE | Admit: 2020-03-03 | Discharge: 2020-03-03 | Disposition: A | Discharge status: Home / Self Care | Payer: Medicare Other | Source: Ambulatory Visit | Attending: Nephrology | Admitting: Nephrology

## 2020-03-03 ENCOUNTER — Emergency Department (HOSPITAL_COMMUNITY)
Admission: EM | Admit: 2020-03-03 | Discharge: 2020-03-03 | Disposition: A | Discharge status: Home / Self Care | Payer: Medicare Other | Attending: Emergency Medicine | Admitting: Emergency Medicine

## 2020-03-03 ENCOUNTER — Encounter (HOSPITAL_COMMUNITY): Payer: Self-pay

## 2020-03-03 DIAGNOSIS — E039 Hypothyroidism, unspecified: Secondary | ICD-10-CM | POA: Diagnosis not present

## 2020-03-03 DIAGNOSIS — Z8616 Personal history of COVID-19: Secondary | ICD-10-CM | POA: Insufficient documentation

## 2020-03-03 DIAGNOSIS — Z91040 Latex allergy status: Secondary | ICD-10-CM | POA: Diagnosis not present

## 2020-03-03 DIAGNOSIS — Z7901 Long term (current) use of anticoagulants: Secondary | ICD-10-CM | POA: Insufficient documentation

## 2020-03-03 DIAGNOSIS — N1832 Chronic kidney disease, stage 3b: Secondary | ICD-10-CM | POA: Insufficient documentation

## 2020-03-03 DIAGNOSIS — Z86718 Personal history of other venous thrombosis and embolism: Secondary | ICD-10-CM | POA: Diagnosis not present

## 2020-03-03 DIAGNOSIS — I2510 Atherosclerotic heart disease of native coronary artery without angina pectoris: Secondary | ICD-10-CM | POA: Insufficient documentation

## 2020-03-03 DIAGNOSIS — K219 Gastro-esophageal reflux disease without esophagitis: Secondary | ICD-10-CM | POA: Diagnosis not present

## 2020-03-03 DIAGNOSIS — N132 Hydronephrosis with renal and ureteral calculous obstruction: Secondary | ICD-10-CM | POA: Diagnosis not present

## 2020-03-03 DIAGNOSIS — E1122 Type 2 diabetes mellitus with diabetic chronic kidney disease: Secondary | ICD-10-CM | POA: Diagnosis not present

## 2020-03-03 DIAGNOSIS — R109 Unspecified abdominal pain: Secondary | ICD-10-CM

## 2020-03-03 DIAGNOSIS — E871 Hypo-osmolality and hyponatremia: Secondary | ICD-10-CM | POA: Diagnosis not present

## 2020-03-03 DIAGNOSIS — I129 Hypertensive chronic kidney disease with stage 1 through stage 4 chronic kidney disease, or unspecified chronic kidney disease: Secondary | ICD-10-CM | POA: Diagnosis not present

## 2020-03-03 DIAGNOSIS — N183 Chronic kidney disease, stage 3 unspecified: Secondary | ICD-10-CM | POA: Diagnosis not present

## 2020-03-03 DIAGNOSIS — Z79899 Other long term (current) drug therapy: Secondary | ICD-10-CM | POA: Insufficient documentation

## 2020-03-03 DIAGNOSIS — R809 Proteinuria, unspecified: Secondary | ICD-10-CM | POA: Diagnosis not present

## 2020-03-03 DIAGNOSIS — N200 Calculus of kidney: Secondary | ICD-10-CM | POA: Diagnosis not present

## 2020-03-03 DIAGNOSIS — R1011 Right upper quadrant pain: Secondary | ICD-10-CM | POA: Diagnosis not present

## 2020-03-03 DIAGNOSIS — I5032 Chronic diastolic (congestive) heart failure: Secondary | ICD-10-CM | POA: Diagnosis not present

## 2020-03-03 DIAGNOSIS — R110 Nausea: Secondary | ICD-10-CM | POA: Diagnosis not present

## 2020-03-03 LAB — COMPREHENSIVE METABOLIC PANEL
ALT: 93 U/L — ABNORMAL HIGH (ref 0–44)
AST: 132 U/L — ABNORMAL HIGH (ref 15–41)
Albumin: 3.1 g/dL — ABNORMAL LOW (ref 3.5–5.0)
Alkaline Phosphatase: 69 U/L (ref 38–126)
Anion gap: 7 (ref 5–15)
BUN: 22 mg/dL (ref 8–23)
CO2: 26 mmol/L (ref 22–32)
Calcium: 8.8 mg/dL — ABNORMAL LOW (ref 8.9–10.3)
Chloride: 98 mmol/L (ref 98–111)
Creatinine, Ser: 1.29 mg/dL — ABNORMAL HIGH (ref 0.44–1.00)
GFR, Estimated: 40 mL/min — ABNORMAL LOW (ref >60)
Glucose, Bld: 121 mg/dL — ABNORMAL HIGH (ref 70–99)
Potassium: 3.8 mmol/L (ref 3.5–5.1)
Sodium: 131 mmol/L — ABNORMAL LOW (ref 135–145)
Total Bilirubin: 0.6 mg/dL (ref 0.3–1.2)
Total Protein: 6.2 g/dL — ABNORMAL LOW (ref 6.5–8.1)

## 2020-03-03 LAB — CBC WITH DIFFERENTIAL/PLATELET
Abs Immature Granulocytes: 0.03 10*3/uL (ref 0.00–0.07)
Basophils Absolute: 0 10*3/uL (ref 0.0–0.1)
Basophils Relative: 1 %
Eosinophils Absolute: 0.4 10*3/uL (ref 0.0–0.5)
Eosinophils Relative: 7 %
HCT: 32.2 % — ABNORMAL LOW (ref 36.0–46.0)
Hemoglobin: 10.3 g/dL — ABNORMAL LOW (ref 12.0–15.0)
Immature Granulocytes: 1 %
Lymphocytes Relative: 13 %
Lymphs Abs: 0.7 10*3/uL (ref 0.7–4.0)
MCH: 30.9 pg (ref 26.0–34.0)
MCHC: 32 g/dL (ref 30.0–36.0)
MCV: 96.7 fL (ref 80.0–100.0)
Monocytes Absolute: 0.7 10*3/uL (ref 0.1–1.0)
Monocytes Relative: 13 %
Neutro Abs: 3.7 10*3/uL (ref 1.7–7.7)
Neutrophils Relative %: 65 %
Platelets: 295 10*3/uL (ref 150–400)
RBC: 3.33 MIL/uL — ABNORMAL LOW (ref 3.87–5.11)
RDW: 13.8 % (ref 11.5–15.5)
WBC: 5.6 10*3/uL (ref 4.0–10.5)
nRBC: 0 % (ref 0.0–0.2)

## 2020-03-03 LAB — RENAL FUNCTION PANEL
Albumin: 3.3 g/dL — ABNORMAL LOW (ref 3.5–5.0)
Anion gap: 7 (ref 5–15)
BUN: 21 mg/dL (ref 8–23)
CO2: 25 mmol/L (ref 22–32)
Calcium: 9.1 mg/dL (ref 8.9–10.3)
Chloride: 97 mmol/L — ABNORMAL LOW (ref 98–111)
Creatinine, Ser: 1.41 mg/dL — ABNORMAL HIGH (ref 0.44–1.00)
GFR, Estimated: 36 mL/min — ABNORMAL LOW (ref >60)
Glucose, Bld: 172 mg/dL — ABNORMAL HIGH (ref 70–99)
Phosphorus: 3 mg/dL (ref 2.5–4.6)
Potassium: 4.4 mmol/L (ref 3.5–5.1)
Sodium: 129 mmol/L — ABNORMAL LOW (ref 135–145)

## 2020-03-03 LAB — URINALYSIS, ROUTINE W REFLEX MICROSCOPIC
Bilirubin Urine: NEGATIVE
Glucose, UA: NEGATIVE mg/dL
Ketones, ur: NEGATIVE mg/dL
Nitrite: NEGATIVE
Protein, ur: 30 mg/dL — AB
RBC / HPF: >50 RBC/hpf — ABNORMAL HIGH (ref 0–5)
Specific Gravity, Urine: 1.014 (ref 1.005–1.030)
pH: 6 (ref 5.0–8.0)

## 2020-03-03 MED ORDER — MORPHINE SULFATE (PF) 2 MG/ML IV SOLN
1.0000 mg | Freq: Once | INTRAVENOUS | Status: AC
  Administered 2020-03-03: 1 mg via INTRAVENOUS
  Filled 2020-03-03: qty 1

## 2020-03-03 NOTE — ED Triage Notes (Signed)
Pt to er, daughter with pt, daughter states that they were sent over from [facility] for a large kidney stone that needed to be crushed, pt c/o abd pain/ flank pain.

## 2020-03-03 NOTE — ED Notes (Signed)
ED Provider at bedside. 

## 2020-03-03 NOTE — ED Notes (Signed)
Bladder scan complete. Scan showed 50cc in bladder. Pt stated she wants to void. Pt placed on bed pan.

## 2020-03-03 NOTE — Discharge Instructions (Addendum)
You were seen here for right flank pain.  Lab work and imaging all looks reassuring.  I would like you to take ibuprofen and Tylenol every 6 hours as needed for pain control please follow dosing the back of bottle.  It is important a follow-up with a urologist, have given the contact information above please call tomorrow for your appointment.  I would also like you to follow-up with your primary care provider as you have slightly elevated liver enzymes.  Come back to the emergency department if you develop chest pain, shortness of breath, severe abdominal pain, uncontrolled nausea, vomiting, diarrhea.

## 2020-03-03 NOTE — ED Provider Notes (Signed)
[facility] EMERGENCY DEPARTMENT Provider Note   CSN: [id number] Arrival date & time: 03/03/20  1630     History Chief Complaint  Patient presents with  . Abdominal Pain    [name] is a 87 y.o. female.  HPI   Patient with significant medical history of hypertension, diabetes, CKD stage III, CAD, atrial fib on apixaban presents with chief complaint of right upper quadrant pain.  Patient endorses she has had pain for the last couple days, she endorses pain is worsened with eating and drinking, she has associated nausea without vomiting, denies constipation or diarrhea, denies difficult with urination, urinary urgency, dysuria or hematuria.  Patient was recent seen in the ED on 02/11 where she came in with similar presentation, work-up revealed that she has a 7 mm right obstructing stone in the mid ureter, patient was referred to urology for further evaluation.  Patient endorses that she went to her nephrologist today where they recommend that she come to emergency department for further evaluation.  Denies any alleviating factors.  Patient denies headaches, fevers, chills, shortness of breath, chest pain, abdominal pain, vomiting, diarrhea, pedal edema.  Past Medical History:  Diagnosis Date  . AICD (automatic cardioverter/defibrillator) present   . Allergy    SESONAL  . Anxiety   . ARTHRITIS   . Arthritis   . Atrial fibrillation ([place])   . CAD (coronary artery disease)    a. history of cardiac arrest 1993;  b. s/p LAD/LCX stenting in 2003;  c. 07/2011 Cath: LM nl, LAD patent stent, D1 80ost, LCX patent stent, RCA min irregs;  d. 04/2012 MV: EF 66%, no ishcemia;  e. 12/2013 Echo: Ef 55%, no rwma, Gr 1 DD, triv AI/MR, mildly dil LA;  f. 12/2013 Lexi MV: intermediate risk - apical ischemia and inf/infsept fixed defect, ? artifactual.  . CAD in native artery 12/31/2013   Previous stents to LAD and Ramus patent on cath today 12/31/13 also There is severe disease in the ostial first  diagonal which is unchanged from most recent cardiac catheterization. The right coronary artery could not be engaged selectively but nonselective angiography showed no significant disease in the proximal and midsegment.     . Cataract    DENIES  . Chronic back pain   . Chronic sinus bradycardia   . CKD (chronic kidney disease), stage III ([place])   . Clotting disorder (HCC)    DVT  . COLITIS 12/02/2007   Qualifier: Diagnosis of  By: Nils Pyle CMA ([place]), [name]    . Diabetes mellitus without complication ([place])    DENIES  . Diverticulosis of colon (without mention of hemorrhage) 2007   Colonoscopy   . Esophageal stricture    a. 2012 s/p dil.  . Esophagitis, unspecified    a. 2012 EGD  . EXTERNAL HEMORRHOIDS   . GERD (gastroesophageal reflux disease)    omeprazole  . H/O hiatal hernia   . Hiatal hernia    a. 2012 EGD.  Marland Kitchen History of DVT in the past, not on Coumadin now    left  leg  . HYPERCHOLESTEROLEMIA   . Hypertension   . ICD (implantable cardiac defibrillator) in place    a. s/p initial ICD in 1993 in setting of cardiac arrest;  b. 01/2007 gen change: Guidant T135 Vitality DS VR single lead ICD.  . Macular degeneration    gets injecton in eye every 5 weeks- last injection - 05/03/2013   . Myocardial infarction ([place]) 1993  . Near syncope 05/22/2015  .  Nephrolithiasis, just saw Dr [name]   . Orthostatic hypotension   . Peripheral vascular disease ([place])    ???  . RECTAL BLEEDING 12/03/2007   Qualifier: Diagnosis of  By: Sharlett Iles MD Byrd Hesselbach   . Unspecified gastritis and gastroduodenitis without mention of hemorrhage    a. 2003 EGD->not noted on 2012 EGD.  Marland Kitchen Unstable angina, neg MI, cath stable.maybe GI 12/30/2013    Patient Active Problem List   Diagnosis Date Noted  . Malnutrition of moderate degree 12/26/2019  . Prolonged QT interval 12/23/2019  . Acute lower UTI 12/23/2019  . Constipation 11/27/2019  . Diarrhea 11/27/2019  . Lesion of pancreas 11/26/2019  .  Fibromuscular dysplasia of right renal artery ([place]) 11/26/2019  . Left renal artery stenosis ([place]) 11/26/2019  . RUQ pain   . Abnormal CT of the abdomen   . Vertigo 11/15/2019  . Advanced care planning/counseling discussion 11/15/2019  . CAD (coronary artery disease)   . Hypothyroidism   . Hyponatremia   . Volume depletion   . TSH elevation 08/01/2019  . Hospital discharge follow-up 08/01/2019  . Exudative age-related macular degeneration of right eye with active choroidal neovascularization ([place]) 04/30/2019  . Retinal hemorrhage of right eye 04/30/2019  . Advanced nonexudative age-related macular degeneration of left eye with subfoveal involvement 04/30/2019  . Exudative age-related macular degeneration of left eye with inactive choroidal neovascularization ([place]) 04/30/2019  . Advanced nonexudative age-related macular degeneration of right eye without subfoveal involvement 04/30/2019  . Posterior vitreous detachment of both eyes 04/30/2019  . Secondary hypercoagulable state ([place]) 03/11/2019  . Persistent atrial fibrillation ([address])   . Proctitis 01/09/2019  . Atrial fibrillation, chronic ([place]) 01/09/2019  . Hyperlipidemia 01/09/2019  . Nausea and vomiting 01/09/2019  . Hyperglycemia 01/09/2019  . COVID-19 virus infection 12/28/2018  . Chronic diarrhea 09/18/2018  . Abdominal pain 09/18/2018  . Osteoarthritis of right knee 03/29/2018  . GERD (gastroesophageal reflux disease)   . Diarrhea of presumed infectious origin 02/06/2018  . Acute kidney injury superimposed on CKD ([place]) 02/05/2018  . Essential hypertension 12/21/2017  . Degenerative scoliosis 02/19/2017  . Burst fracture of lumbar vertebra ([place]) 02/19/2017  . History of drug-induced prolonged QT interval with torsade de pointes 11/01/2016  . Iron deficiency anemia 10/30/2016  . Long term current use of anticoagulant 02/17/2016  . Near syncope 05/22/2015  . Diabetes mellitus type 2, diet-controlled ([place]) 09/14/2014  . ICD  (implantable cardioverter-defibrillator) battery depletion 01/20/2014  . Abnormal nuclear stress test, 12/30/13 12/31/2013  . Coronary artery disease involving native coronary artery of native heart without angina pectoris 12/31/2013  . Paroxysmal atrial fibrillation, chads2 Vasc2 score of 5, on eliquis 10/16/2013  . Catecholaminergic polymorphic ventricular tachycardia ([place]) 10/16/2013  . Ureteral stone with hydronephrosis 05/20/2013  . Metabolic syndrome [id number]  . Orthostatic hypotension 07/27/2011  . Chest pain 07/25/2011  . Weakness 07/25/2011    Class: Acute  . Chronic sinus bradycardia 07/25/2011  . ICD (implantable cardioverter-defibrillator) in place 07/25/2011  . CKD (chronic kidney disease) stage 3, GFR 30-59 ml/min (HCC) 07/25/2011    Class: Chronic  . History of DVT in the past, on eliquis now 07/25/2011    Class: History of  . Nephrolithiasis, just saw Dr [name] 07/25/2011    Class: History of  . DYSPHAGIA 03/24/2010  . Chronic ischemic heart disease 12/03/2007  . Irritable bowel syndrome with diarrhea 12/03/2007  . EPIGASTRIC PAIN 12/03/2007  . Hyperlipidemia associated with type 2 diabetes mellitus ([place]) 12/02/2007  Class: History of  . EXTERNAL HEMORRHOIDS 12/02/2007    Class: History of  . ESOPHAGITIS 12/02/2007  . GASTRITIS 12/02/2007  . DIVERTICULOSIS, COLON 12/02/2007  . ARTHRITIS 12/02/2007    Class: Chronic    Past Surgical History:  Procedure Laterality Date  . BREAST BIOPSY Left 11/2013  . CARDIAC CATHETERIZATION    . CARDIAC CATHETERIZATION  01/01/15   patent stents -there is severe disease in ostial 1st diag but without change.   Marland Kitchen CARDIAC DEFIBRILLATOR PLACEMENT  02/05/2007   Guidant  . CARDIOVASCULAR STRESS TEST  12/30/2013   abnormal  . CARDIOVERSION N/A 02/13/2019   Procedure: CARDIOVERSION;  Surgeon: Sanda Klein, MD;  Location: [place] [facility];  Service: Cardiovascular;  Laterality: N/A;  . CATARACT EXTRACTION Right    Dr. Katy Fitch   . COLONOSCOPY    . coronary stents     . CYSTOSCOPY WITH URETEROSCOPY Right 05/20/2013   Procedure: CYSTOSCOPY, RIGHT URETEROSCOPY STONE EXTRACTION, Insertion of right DOUBLE J STENT ;  Surgeon: Irine Seal, MD;  Location: [facility];  Service: Urology;  Laterality: Right;  . CYSTOSCOPY WITH URETEROSCOPY AND STENT PLACEMENT N/A 06/03/2013   Procedure: SECOND LOOK CYSTOSCOPY WITH URETEROSCOPY  HOLMIUM LASER LITHO AND STONE EXTRACTION Sammie Bench ;  Surgeon: Malka So, MD;  Location: [facility];  Service: Urology;  Laterality: N/A;  . ESOPHAGOGASTRODUODENOSCOPY (EGD) WITH PROPOFOL N/A 11/20/2019   Procedure: ESOPHAGOGASTRODUODENOSCOPY (EGD) WITH PROPOFOL;  Surgeon: Daneil Dolin, MD;  [facility];  Service: Endoscopy;  Laterality: N/A;  . GIVENS CAPSULE STUDY N/A 10/30/2016   Procedure: GIVENS CAPSULE STUDY;  Surgeon: Irene Shipper, MD;  Location: [place];  Service: Endoscopy;  Laterality: N/A;  NEEDS TO BE ADMITTED FOR OBSERVATION PT. HAS DEFIB  . HEMORROIDECTOMY  80's  . HOLMIUM LASER APPLICATION Right [id number]   Procedure: HOLMIUM LASER APPLICATION;  Surgeon: Irine Seal, MD;  Location: [facility];  Service: Urology;  Laterality: Right;  . HYSTEROSCOPY WITH D & C  09/02/2010   Procedure: DILATATION AND CURETTAGE (D&C) /HYSTEROSCOPY;  Surgeon: Margarette Asal;  Location: [facility];  Service: Gynecology;  Laterality: N/A;  Dilation and Curettage with Hysteroscopy and Polypectomy  . IMPLANTABLE CARDIOVERTER DEFIBRILLATOR (ICD) GENERATOR CHANGE N/A 02/10/2014   Procedure: ICD GENERATOR CHANGE;  Surgeon: Sanda Klein, MD;  Location: [place] C[facility];  Service: Cardiovascular;  Laterality: N/A;  . LEFT HEART CATHETERIZATION WITH CORONARY ANGIOGRAM N/A 07/28/2011   Procedure: LEFT HEART CATHETERIZATION WITH CORONARY ANGIOGRAM;  Surgeon: Lorretta Harp, MD;  Location: [facility];  Service: Cardiovascular;  Laterality: N/A;  . LEFT HEART CATHETERIZATION WITH CORONARY ANGIOGRAM N/A 12/31/2013   Procedure:  LEFT HEART CATHETERIZATION WITH CORONARY ANGIOGRAM;  Surgeon: Wellington Hampshire, MD;  Location: [facility];  Service: Cardiovascular;  Laterality: N/A;     OB History   No obstetric history on file.     Family History  Problem Relation Age of Onset  . Stroke Mother   . Other Mother        brain tumor  . Other Father        MI  . Heart attack Father   . Stroke Sister   . Macular degeneration Sister   . Diabetes Daughter   . Cancer Daughter        ovarian  . Colon polyps Daughter   . Atrial fibrillation Sister   . Hyperlipidemia Sister   . Osteoporosis Sister   . Stroke Sister   . Uterine cancer Sister   .  Heart attack Brother   . Heart disease Brother   . Asthma Brother   . Breast cancer Neg Hx     Social History   Tobacco Use  . Smoking status: Never Smoker  . Smokeless tobacco: Never Used  Vaping Use  . Vaping Use: Never used  Substance Use Topics  . Alcohol use: No  . Drug use: No    Home Medications Prior to Admission medications   Medication Sig Start Date End Date Taking? Authorizing Provider  acetaminophen (TYLENOL) 500 MG tablet Take 500 mg by mouth every 6 (six) hours as needed.   Yes [provider]  amiodarone (PACERONE) 200 MG tablet TAKE 1 TABLET DAILY 11/24/19  Yes Fenton, Clint R, PA  apixaban (ELIQUIS) 2.5 MG TABS tablet Take 1 tablet (2.5 mg total) by mouth 2 (two) times daily. 02/11/20  Yes Croitoru, Mihai, MD  carboxymethylcellulose (REFRESH PLUS) 0.5 % SOLN Place 1 drop into both eyes 3 (three) times daily as needed (dry/irritated eyes.).   Yes [provider]  Cholecalciferol (VITAMIN D) 50 MCG (2000 UT) CAPS Take 2,000 Units by mouth daily.    Yes [provider]  feeding supplement (ENSURE ENLIVE / ENSURE PLUS) LIQD Take 237 mLs by mouth 2 (two) times daily between meals. 12/26/19  Yes Mikhail, Velta Addison, DO  hydrALAZINE (APRESOLINE) 25 MG tablet Take 25 mg by mouth 3 (three) times daily. 01/16/20  Yes [provider]  HYDROcodone-acetaminophen (NORCO) 5-325 MG tablet Take 1 tablet by mouth every 4 (four) hours as needed for moderate pain. [id number]  Yes Delora Fuel, MD  isosorbide mononitrate (IMDUR) 30 MG 24 hr tablet TAKE ONE HALF (1/2) TABLET DAILY Patient taking differently: Take 15 mg by mouth daily. 09/23/19  Yes Croitoru, Mihai, MD  levothyroxine (SYNTHROID) 100 MCG tablet Take 1 tablet (100 mcg total) by mouth daily. 01/27/20  Yes Croitoru, Mihai, MD  loratadine (CLARITIN) 10 MG tablet Take 1 tablet (10 mg total) by mouth daily. (for itching) 01/20/19  Yes Ronnie Doss M, DO  Multiple Vitamins-Minerals (CENTRUM SILVER ULTRA WOMENS PO) Take 1 tablet by mouth daily.   Yes [provider]  nitroGLYCERIN (NITROSTAT) 0.4 MG SL tablet Place 1 tablet (0.4 mg total) under the tongue every 5 (five) minutes as needed for chest pain. 12/20/18  Yes Claretta Fraise, MD  ondansetron (ZOFRAN) 4 MG tablet Take 1 tablet (4 mg total) by mouth every 6 (six) hours as needed for nausea or vomiting. [id number]  Yes Delora Fuel, MD  PARoxetine (PAXIL) 20 MG tablet TAKE (1) TABLET DAILY IN THE MORNING. Patient taking differently: Take 20 mg by mouth daily. 12/15/19  Yes Gottschalk, Leatrice Jewels M, DO  rosuvastatin (CRESTOR) 10 MG tablet Take 1 tablet (10 mg total) by mouth daily. 02/24/20  Yes Ronnie Doss M, DO  tamsulosin (FLOMAX) 0.4 MG CAPS capsule Take 1 capsule (0.4 mg total) by mouth daily. [id number]  Yes Delora Fuel, MD  diphenoxylate-atropine (LOMOTIL) 2.5-0.025 MG tablet TAKE 1 TABLET EVERY 8 HOURS AS NEEDED FOR DIARRHEA Patient not taking: No sig reported 03/25/19   [name], DO  HYDROcodone-acetaminophen (NORCO) 5-325 MG tablet Take 1 tablet by mouth every 4 (four) hours as needed for moderate pain. [id number]   Delora Fuel, MD  meclizine (ANTIVERT) 25 MG tablet Take 25 mg by mouth 3 (three) times daily as needed for dizziness or nausea.  Patient not taking: No sig reported 07/22/19   [provider]  pantoprazole (PROTONIX) 40 MG tablet Take  1 tablet (40 mg total) by mouth every evening. 02/21/19   [name], DO    Allergies    Sotalol, Bactrim [sulfamethoxazole-trimethoprim], Ciprofloxacin, Tramadol, Actos [pioglitazone], Adhesive [tape], Atorvastatin, Contrast media [iodinated diagnostic agents], Latex, Pedi-pre tape spray [wound dressing adhesive], and Sulfa antibiotics  Review of Systems   Review of Systems  Constitutional: Negative for chills and fever.  HENT: Negative for congestion and sore throat.   Eyes: Negative for visual disturbance.  Respiratory: Negative for shortness of breath.   Cardiovascular: Negative for chest pain.  Gastrointestinal: Positive for abdominal pain. Negative for constipation, diarrhea, nausea and vomiting.  Genitourinary: Negative for difficulty urinating, enuresis, hematuria and urgency.  Musculoskeletal: Negative for back pain.  Skin: Negative for rash.  Neurological: Negative for dizziness and headaches.  Hematological: Does not bruise/bleed easily.    Physical Exam Updated Vital Signs BP (!) 158/74   Pulse 68   Temp 98.7 F (37.1 C) (Oral)   Resp 16   Ht '5\' 3"'$  (1.6 m)   Wt 60.8 kg   SpO2 94%   BMI 23.74 kg/m   Physical Exam Vitals and nursing note reviewed.  Constitutional:      General: She is not in acute distress.    Appearance: She is not ill-appearing.  HENT:     Head: Normocephalic and atraumatic.     Nose: No congestion.     Mouth/Throat:     Mouth: Mucous membranes are moist.     Pharynx: Oropharynx is clear. No oropharyngeal exudate or posterior oropharyngeal erythema.  Eyes:     Conjunctiva/sclera: Conjunctivae normal.  Cardiovascular:     Rate and Rhythm: Normal rate and regular rhythm.     Pulses: Normal pulses.     Heart sounds: No murmur heard. No friction rub. No gallop.   Pulmonary:     Effort: No respiratory distress.     Breath sounds: No wheezing, rhonchi or rales.  Abdominal:      Palpations: Abdomen is soft.     Tenderness: There is abdominal tenderness. There is no right CVA tenderness or left CVA tenderness.     Comments: Patient's abdomen is visualized, is nondistended, normoactive bowel sounds, dull to percussion.  She had tenderness to palpation her right upper quadrant, as well as umbilical pain.  No rebound tenderness, negative peritoneal sign, no CVA tenderness present.  Musculoskeletal:     Right lower leg: No edema.     Left lower leg: No edema.  Skin:    General: Skin is warm and dry.  Neurological:     Mental Status: She is alert.  Psychiatric:        Mood and Affect: Mood normal.     ED Results / Procedures / Treatments   Labs (all labs ordered are listed, but only abnormal results are displayed) Labs Reviewed  COMPREHENSIVE METABOLIC PANEL - Abnormal; Notable for the following components:      Result Value   Sodium 131 (*)    Glucose, Bld 121 (*)    Creatinine, Ser 1.29 (*)    Calcium 8.8 (*)    Total Protein 6.2 (*)    Albumin 3.1 (*)    AST 132 (*)    ALT 93 (*)    GFR, Estimated 40 (*)    All other components within normal limits  CBC WITH DIFFERENTIAL/PLATELET - Abnormal; Notable for the following components:   RBC 3.33 (*)    Hemoglobin 10.3 (*)    HCT 32.2 (*)  All other components within normal limits  URINALYSIS, ROUTINE W REFLEX MICROSCOPIC - Abnormal; Notable for the following components:   Color, Urine AMBER (*)    APPearance HAZY (*)    Hgb urine dipstick LARGE (*)    Protein, ur 30 (*)    Leukocytes,Ua TRACE (*)    RBC / HPF >50 (*)    Bacteria, UA RARE (*)    All other components within normal limits    EKG None  Radiology No results found.  Procedures Procedures   Medications Ordered in ED Medications  morphine 2 MG/ML injection 1 mg (1 mg Intravenous Given 03/03/20 1830)    ED Course  I have reviewed the triage vital signs and the nursing notes.  Pertinent labs & imaging results that were  available during my care of the patient were reviewed by me and considered in my medical decision making (see chart for details).    MDM Rules/Calculators/A&P                          Initial impression-patient presents with right upper quadrant and flank pain, she is alert, does not appear in acute distress, vital signs reassuring.  Will obtain basic lab work, bladder scan provide patient with pain medications and reevaluate.  Will consult with urology for further recommendations.  Work-up-CBC negative for leukocytosis, shows normocytic anemia appears to be a baseline for patient.  CMP shows hyponatremia of 131, hyperglycemia 121, creatinine of 1.29 appears to be a baseline for patient, slightly elevated liver enzymes, no anion gap present.  UA shows 30 proteins, trace leukocytes, greater than 50 red blood cells, rare bacteria.  Bladder scan shows 50 cc of urine, is urinating without difficulty.  Consult will consult with urology for further recommendations. Spoke with Dr. Alinda Money of urology, he recommends outpatient  Lithotripsy and does not recommend stenting as she will have to undergo 2 procedures. He recommends pain control and have her following up with urology tomorrow for further evaluation.  Reassessment patient continues to have no pain at this time, no other complaints.  Vital signs and lab work all remained stable.  Spoken with patient's son Ritchy who I discussed plan for discharge.  Informed him that patient needs to follow-up with urology with Dr. Alinda Money for an outpatient lithotripsy.  He verbalized that he understood and agreed with the plan.  He will come and pick up the patient.  Rule out- I have low suspicion for systemic infection as patient is nontoxic-appearing, vital signs reassuring, no obvious source infection on my exam. Low suspicion for UTI or pyelonephritis as UA is negative for signs of infection. Patient does have noted red blood cells in her UA but I suspect this is  secondary due to kidney stone. Low suspicion for dehydration as patient does not feel dry on my exam, creatinine is 1.29 decreased from baseline. Low suspicion for gallbladder abnormality as patient has no right upper quadrant pain, she does have elevated liver enzymes but this appears to be baseline for her.  Plan-suspect patient suffering from a kidney stone which would explain the blood in her urine.  We will have her follow-up with urology for further evaluation.  Suspect patient's elevated liver enzymes secondary due to statins we will have her follow-up with PCP for further evaluation.  Vital signs have remained stable, no indication for hospital admission.  Patient discussed with attending and they agreed with assessment and plan.  Patient given at home  care as well strict return precautions.  Patient verbalized that they understood agreed to said plan.   Final Clinical Impression(s) / ED Diagnoses Final diagnoses:  Right flank pain    Rx / DC Orders ED Discharge Orders    None       Marcello Fennel, PA-C 03/03/20 2206    Lajean Saver, MD 03/04/20 1525

## 2020-03-10 ENCOUNTER — Other Ambulatory Visit: Payer: Self-pay

## 2020-03-10 ENCOUNTER — Ambulatory Visit (HOSPITAL_COMMUNITY)
Admission: RE | Admit: 2020-03-10 | Discharge: 2020-03-10 | Disposition: A | Discharge status: Home / Self Care | Payer: Medicare Other | Source: Ambulatory Visit | Attending: Urology | Admitting: Urology

## 2020-03-10 ENCOUNTER — Ambulatory Visit (INDEPENDENT_AMBULATORY_CARE_PROVIDER_SITE_OTHER): Payer: Medicare Other | Admitting: Urology

## 2020-03-10 ENCOUNTER — Encounter: Payer: Self-pay | Admitting: Urology

## 2020-03-10 VITALS — BP 159/68 | HR 67 | Temp 98.3°F | Ht 63.0 in | Wt 130.0 lb

## 2020-03-10 DIAGNOSIS — M419 Scoliosis, unspecified: Secondary | ICD-10-CM | POA: Diagnosis not present

## 2020-03-10 DIAGNOSIS — N200 Calculus of kidney: Secondary | ICD-10-CM

## 2020-03-10 DIAGNOSIS — I25119 Atherosclerotic heart disease of native coronary artery with unspecified angina pectoris: Secondary | ICD-10-CM

## 2020-03-10 LAB — MICROSCOPIC EXAMINATION
Epithelial Cells (non renal): >10 /hpf — AB (ref 0–10)
RBC, Urine: >30 /hpf — AB (ref 0–2)
Renal Epithel, UA: NONE SEEN /hpf

## 2020-03-10 LAB — URINALYSIS, ROUTINE W REFLEX MICROSCOPIC
Bilirubin, UA: NEGATIVE
Glucose, UA: NEGATIVE
Ketones, UA: NEGATIVE
Nitrite, UA: NEGATIVE
Specific Gravity, UA: 1.01 (ref 1.005–1.030)
Urobilinogen, Ur: 0.2 mg/dL (ref 0.2–1.0)
pH, UA: 7 (ref 5.0–7.5)

## 2020-03-10 MED ORDER — TAMSULOSIN HCL 0.4 MG PO CAPS: 0.4000 mg | ORAL_CAPSULE | Freq: Every day | ORAL | 0 refills | Status: DC

## 2020-03-10 MED ORDER — HYDROCODONE-ACETAMINOPHEN 5-325 MG PO TABS
1.0000 | ORAL_TABLET | ORAL | 0 refills | Status: DC | PRN
Start: 2020-03-10 — End: 2020-04-05

## 2020-03-10 NOTE — Progress Notes (Signed)
Urological Symptom Review  Patient is experiencing the following symptoms: Get up at night to urinate Leakage of urine Kidney stones  Review of Systems  Gastrointestinal (upper)  : Negative for upper GI symptoms  Gastrointestinal (lower) : Negative for lower GI symptoms  Constitutional : Negative for symptoms  Skin: Negative for skin symptoms  Eyes: Negative for eye symptoms  Ear/Nose/Throat : Negative for Ear/Nose/Throat symptoms  Hematologic/Lymphatic: Negative for Hematologic/Lymphatic symptoms  Cardiovascular : Negative for cardiovascular symptoms  Respiratory : Negative for respiratory symptoms  Endocrine: Negative for endocrine symptoms  Musculoskeletal: Negative for musculoskeletal symptoms  Neurological: Negative for neurological symptoms  Psychologic: Negative for psychiatric symptoms

## 2020-03-10 NOTE — Progress Notes (Signed)
03/10/2020 10:14 AM   [name] [birth year] QR:[id number]  Referring provider: [name], DO [address] [place],  [place] [address]  No chief complaint on file.   HPI: [name] is a 87yo here for followup for nephrolithiasis. She developed right flank pain on 02/27/2020 and presented to the ER. CT scan showed a 6-61m right mid ureteral calculus. She has not passed her calculus. She continues to have right flank pain which is sharp, intermittent, moderate and nonraditing. She has associated nausea and decreased appetite. No other associated symptoms. She is currently on norco, flomax and zofran. She has a defib  Her records from [facility] are as follows: I have pain or burning with urination.  HPI: Renee Deytonis a 87year-old female established patient who is here for pain or burning while urinating.  She does have pain or burning when she urinates. She first noticed the symptom approximately 04/16/2016.   She does urinate more frequently than once every 4 hours in the daytime. She does not have to strain or bear down to start her urinary stream. She does not dribble at the end of urination. She does have an abnormal sensation when needing to urinate. She usually gets up at night to urinate 2 times.   She has not previously had an indwelling catheter in for more than two weeks at a time.   BJustireturns today in f/u. She has had recurrent infections and multiple antibiotics in the last year. She had cipro at one point and developed itching that has been persistent for several months. I saw her in April and gave her estrogen cream but she never used it. She has had some mild irritation in the vaginal area for the past week and mild dysuria. She has ahd no hematuria. She has no increased frequency. She had a CT in March 2017 that showed nephrocalcinosis but no obstructing stones. remotely but not now.   05/11/2017: She developed dysuria 1 week ago. NO hematuria. She has associated    06/22/2017: The dysuria improved slightly with the ceftin for an Ecoli UTI. She has burning in her groin which has been present for 1 week. no other associated symptoms. UA today is normal     CC: I have kidney stones.  HPI: The problem is on the right side. She first stated noticing pain on approximately 11/11/2015. This is not her first kidney stone. Her first stone was approximately 04/16/1997. She has had more than 5 stones prior to getting this one. She is currently having flank pain and nausea. She denies having back pain, groin pain, vomiting, fever, and chills. She has not caught a stone in her urine strainer since her symptoms began.   She has never had surgical treatment for calculi in the past.   BAnairareturns today with a 2 week history of right flank pain with anorexia and nausea. The pain radiates into the RLQ and she has had some dysuria for the last day or two. She ha s had no hematuria. She has a history of nephrocalcinosis. She has some rectal urgency as well. She has had chills without fever. She had a UCx on 10/5 that grew Mx species. The pain actually began in her legs and moved up. Her pain is better today.   05/26/2016: She continues to have bilateral flank pain. She presented to the ER 1 months ago with severe flank pain and had a CT scan. It showed stable bilateral renal calculi but  no ureteral calculi or hydronephrosis.   05/11/2017: 1 week ago she developed right flank pain that is sharp, intermittent, moderate and radiating to her groin. CT stone study from today showed no ureteral calculi. She has stable 2-3m bilateral renal calculi   06/22/2017: NO stone vents since last visit. No flank pain        PMH: Past Medical History:  Diagnosis Date  . AICD (automatic cardioverter/defibrillator) present   . Allergy    SESONAL  . Anxiety   . ARTHRITIS   . Arthritis   . Atrial fibrillation ([place]   . CAD (coronary artery disease)    a. history of cardiac arrest 1993;   b. s/p LAD/LCX stenting in 2003;  c. 07/2011 Cath: LM nl, LAD patent stent, D1 80ost, LCX patent stent, RCA min irregs;  d. 04/2012 MV: EF 66%, no ishcemia;  e. 12/2013 Echo: Ef 55%, no rwma, Gr 1 DD, triv AI/MR, mildly dil LA;  f. 12/2013 Lexi MV: intermediate risk - apical ischemia and inf/infsept fixed defect, ? artifactual.  . CAD in native artery 12/31/2013   Previous stents to LAD and Ramus patent on cath today 12/31/13 also There is severe disease in the ostial first diagonal which is unchanged from most recent cardiac catheterization. The right coronary artery could not be engaged selectively but nonselective angiography showed no significant disease in the proximal and midsegment.     . Cataract    DENIES  . Chronic back pain   . Chronic sinus bradycardia   . CKD (chronic kidney disease), stage III ([facility]   . Clotting disorder (HCC)    DVT  . COLITIS 12/02/2007   Qualifier: Diagnosis of  By: KNils PyleCMA (AEl Combate, LMearl Latin   . Diabetes mellitus without complication ([facility]    DENIES  . Diverticulosis of colon (without mention of hemorrhage) 2007   Colonoscopy   . Esophageal stricture    a. 2012 s/p dil.  . Esophagitis, unspecified    a. 2012 EGD  . EXTERNAL HEMORRHOIDS   . GERD (gastroesophageal reflux disease)    omeprazole  . H/O hiatal hernia   . Hiatal hernia    a. 2012 EGD.  .Marland KitchenHistory of DVT in the past, not on Coumadin now    left  leg  . HYPERCHOLESTEROLEMIA   . Hypertension   . ICD (implantable cardiac defibrillator) in place    a. s/p initial ICD in 1993 in setting of cardiac arrest;  b. 01/2007 gen change: Guidant T135 Vitality DS VR single lead ICD.  . Macular degeneration    gets injecton in eye every 5 weeks- last injection - 05/03/2013   . Myocardial infarction ([place] 1993  . Near syncope 05/22/2015  . Nephrolithiasis, just saw Dr WJeffie Pollock "OK"   . Orthostatic hypotension   . Peripheral vascular disease ([place]    ???  . RECTAL BLEEDING 12/03/2007   Qualifier: Diagnosis  of  By: PSharlett IlesMD FByrd Hesselbach  . Unspecified gastritis and gastroduodenitis without mention of hemorrhage    a. 2003 EGD->not noted on 2012 EGD.  .Marland KitchenUnstable angina, neg MI, cath stable.maybe GI 12/30/2013    Surgical History: Past Surgical History:  Procedure Laterality Date  . BREAST BIOPSY Left 11/2013  . CARDIAC CATHETERIZATION    . CARDIAC CATHETERIZATION  01/01/15   patent stents -there is severe disease in ostial 1st diag but without change.   .Marland KitchenCARDIAC DEFIBRILLATOR PLACEMENT  02/05/2007   Guidant  . CARDIOVASCULAR STRESS TEST  12/30/2013   abnormal  . CARDIOVERSION N/A 02/13/2019   Procedure: CARDIOVERSION;  Surgeon: Sanda Klein, MD;  Location: [facility];  Service: Cardiovascular;  Laterality: N/A;  . CATARACT EXTRACTION Right    Dr. Katy Fitch  . COLONOSCOPY    . coronary stents     . CYSTOSCOPY WITH URETEROSCOPY Right 05/20/2013   Procedure: CYSTOSCOPY, RIGHT URETEROSCOPY STONE EXTRACTION, Insertion of right DOUBLE J STENT ;  Surgeon: Irine Seal, MD;  Location: [facility];  Service: Urology;  Laterality: Right;  . CYSTOSCOPY WITH URETEROSCOPY AND STENT PLACEMENT N/A 06/03/2013   Procedure: SECOND LOOK CYSTOSCOPY WITH URETEROSCOPY  HOLMIUM LASER LITHO AND STONE EXTRACTION Sammie Bench ;  Surgeon: Malka So, MD;  Location: [facility];  Service: Urology;  Laterality: N/A;  . ESOPHAGOGASTRODUODENOSCOPY (EGD) WITH PROPOFOL N/A 11/20/2019   Procedure: ESOPHAGOGASTRODUODENOSCOPY (EGD) WITH PROPOFOL;  Surgeon: Daneil Dolin, MD;  [facility];  Service: Endoscopy;  Laterality: N/A;  . GIVENS CAPSULE STUDY N/A 10/30/2016   Procedure: GIVENS CAPSULE STUDY;  Surgeon: Irene Shipper, MD;  Location: [facility];  Service: Endoscopy;  Laterality: N/A;  NEEDS TO BE ADMITTED FOR OBSERVATION PT. HAS DEFIB  . HEMORROIDECTOMY  80's  . HOLMIUM LASER APPLICATION Right [id number]   Procedure: HOLMIUM LASER APPLICATION;  Surgeon: Irine Seal, MD;  Location: [facility];  Service: Urology;   Laterality: Right;  . HYSTEROSCOPY WITH D & C  09/02/2010   Procedure: DILATATION AND CURETTAGE (D&C) /HYSTEROSCOPY;  Surgeon: Margarette Asal;  Location: [facility];  Service: Gynecology;  Laterality: N/A;  Dilation and Curettage with Hysteroscopy and Polypectomy  . IMPLANTABLE CARDIOVERTER DEFIBRILLATOR (ICD) GENERATOR CHANGE N/A 02/10/2014   Procedure: ICD GENERATOR CHANGE;  Surgeon: Sanda Klein, MD;  Location: [facility];  Service: Cardiovascular;  Laterality: N/A;  . LEFT HEART CATHETERIZATION WITH CORONARY ANGIOGRAM N/A 07/28/2011   Procedure: LEFT HEART CATHETERIZATION WITH CORONARY ANGIOGRAM;  Surgeon: Lorretta Harp, MD;  Location: [facility];  Service: Cardiovascular;  Laterality: N/A;  . LEFT HEART CATHETERIZATION WITH CORONARY ANGIOGRAM N/A 12/31/2013   Procedure: LEFT HEART CATHETERIZATION WITH CORONARY ANGIOGRAM;  Surgeon: Wellington Hampshire, MD;  Location: [facility];  Service: Cardiovascular;  Laterality: N/A;    Home Medications:  Allergies as of 03/10/2020      Reactions   Sotalol Other (See Comments)   Torsades   Bactrim [sulfamethoxazole-trimethoprim]    Itchy rash   Ciprofloxacin Nausea Only   Tramadol Other (See Comments)   Hallucination, bad-dream   Actos [pioglitazone] Swelling   Adhesive [tape] Other (See Comments)   Causes sores   Atorvastatin Other (See Comments)   myalgia   Contrast Media [iodinated Diagnostic Agents] Other (See Comments)   Headache (no action/pre-med required)   Latex Rash   Pedi-pre Tape Spray [wound Dressing Adhesive] Other (See Comments)   Causes sores   Sulfa Antibiotics Other (See Comments)   Causes sores      Medication List       Accurate as of March 10, 2020 10:14 AM. If you have any questions, ask your nurse or doctor.        STOP taking these medications   HYDROcodone-acetaminophen 5-325 MG tablet Commonly known as: Norco Stopped by: Nicolette Bang, MD     TAKE these medications   acetaminophen 500 MG  tablet Commonly known as: TYLENOL Take 500 mg by mouth every 6 (six) hours as needed.   amiodarone 200 MG tablet Commonly known as: PACERONE TAKE 1 TABLET DAILY  apixaban 2.5 MG Tabs tablet Commonly known as: ELIQUIS Take 1 tablet (2.5 mg total) by mouth 2 (two) times daily.   carboxymethylcellulose 0.5 % Soln Commonly known as: REFRESH PLUS Place 1 drop into both eyes 3 (three) times daily as needed (dry/irritated eyes.).   CENTRUM SILVER ULTRA WOMENS PO Take 1 tablet by mouth daily.   diphenoxylate-atropine 2.5-0.025 MG tablet Commonly known as: LOMOTIL TAKE 1 TABLET EVERY 8 HOURS AS NEEDED FOR DIARRHEA   feeding supplement Liqd Take 237 mLs by mouth 2 (two) times daily between meals.   hydrALAZINE 25 MG tablet Commonly known as: APRESOLINE Take 25 mg by mouth 3 (three) times daily.   isosorbide mononitrate 30 MG 24 hr tablet Commonly known as: IMDUR TAKE ONE HALF (1/2) TABLET DAILY What changed: See the new instructions.   levothyroxine 100 MCG tablet Commonly known as: SYNTHROID Take 1 tablet (100 mcg total) by mouth daily.   loratadine 10 MG tablet Commonly known as: CLARITIN Take 1 tablet (10 mg total) by mouth daily. (for itching)   meclizine 25 MG tablet Commonly known as: ANTIVERT Take 25 mg by mouth 3 (three) times daily as needed for dizziness or nausea.   nitroGLYCERIN 0.4 MG SL tablet Commonly known as: NITROSTAT Place 1 tablet (0.4 mg total) under the tongue every 5 (five) minutes as needed for chest pain.   ondansetron 4 MG tablet Commonly known as: ZOFRAN Take 1 tablet (4 mg total) by mouth every 6 (six) hours as needed for nausea or vomiting.   pantoprazole 40 MG tablet Commonly known as: PROTONIX Take 1 tablet (40 mg total) by mouth every evening.   PARoxetine 20 MG tablet Commonly known as: PAXIL TAKE (1) TABLET DAILY IN THE MORNING. What changed: See the new instructions.   rosuvastatin 10 MG tablet Commonly known as: CRESTOR Take  1 tablet (10 mg total) by mouth daily.   tamsulosin 0.4 MG Caps capsule Commonly known as: FLOMAX Take 1 capsule (0.4 mg total) by mouth daily.   traMADol 50 MG tablet Commonly known as: ULTRAM Take 50 mg by mouth every 6 (six) hours as needed.   Vitamin D 50 MCG (2000 UT) Caps Take 2,000 Units by mouth daily.       Allergies:  Allergies  Allergen Reactions  . Sotalol Other (See Comments)    Torsades   . Bactrim [Sulfamethoxazole-Trimethoprim]     Itchy rash  . Ciprofloxacin Nausea Only  . Tramadol Other (See Comments)    Hallucination, bad-dream  . Actos [Pioglitazone] Swelling  . Adhesive [Tape] Other (See Comments)    Causes sores  . Atorvastatin Other (See Comments)    myalgia  . Contrast Media [Iodinated Diagnostic Agents] Other (See Comments)    Headache (no action/pre-med required)  . Latex Rash  . Pedi-Pre Tape Spray [Wound Dressing Adhesive] Other (See Comments)    Causes sores  . Sulfa Antibiotics Other (See Comments)    Causes sores    Family History: Family History  Problem Relation Age of Onset  . Stroke Mother   . Other Mother        brain tumor  . Other Father        MI  . Heart attack Father   . Stroke Sister   . Macular degeneration Sister   . Diabetes Daughter   . Cancer Daughter        ovarian  . Colon polyps Daughter   . Atrial fibrillation Sister   . Hyperlipidemia Sister   .  Osteoporosis Sister   . Stroke Sister   . Uterine cancer Sister   . Heart attack Brother   . Heart disease Brother   . Asthma Brother   . Breast cancer Neg Hx     Social History:  reports that she has never smoked. She has never used smokeless tobacco. She reports that she does not drink alcohol and does not use drugs.  ROS: All other review of systems were reviewed and are negative except what is noted above in HPI  Physical Exam: BP (!) 159/68   Pulse 67   Temp 98.3 F (36.8 C)   Ht '5\' 3"'$  (1.6 m)   Wt 130 lb (59 kg)   BMI 23.03 kg/m    Constitutional:  Alert and oriented, No acute distress. HEENT: [place] AT, moist mucus membranes.  Trachea midline, no masses. Cardiovascular: No clubbing, cyanosis, or edema. Respiratory: Normal respiratory effort, no increased work of breathing. GI: Abdomen is soft, nontender, nondistended, no abdominal masses GU: No CVA tenderness.  Lymph: No cervical or inguinal lymphadenopathy. Skin: No rashes, bruises or suspicious lesions. Neurologic: Grossly intact, no focal deficits, moving all 4 extremities. Psychiatric: Normal mood and affect.  Laboratory Data: Lab Results  Component Value Date   WBC 5.6 03/03/2020   HGB 10.3 (L) 03/03/2020   HCT 32.2 (L) 03/03/2020   MCV 96.7 03/03/2020   PLT 295 03/03/2020    Lab Results  Component Value Date   CREATININE 1.29 (H) 03/03/2020    No results found for: PSA  No results found for: TESTOSTERONE  Lab Results  Component Value Date   HGBA1C 6.6 08/15/2019    Urinalysis    Component Value Date/Time   COLORURINE AMBER (A) 03/03/2020 1815   APPEARANCEUR HAZY (A) 03/03/2020 1815   APPEARANCEUR Clear 11/26/2019 1301   LABSPEC 1.014 03/03/2020 1815   PHURINE 6.0 03/03/2020 1815   GLUCOSEU NEGATIVE 03/03/2020 1815   HGBUR LARGE (A) 03/03/2020 1815   BILIRUBINUR NEGATIVE 03/03/2020 1815   BILIRUBINUR Negative 11/26/2019 1301   KETONESUR NEGATIVE 03/03/2020 1815   PROTEINUR 30 (A) 03/03/2020 1815   UROBILINOGEN negative 03/15/2015 1352   UROBILINOGEN 0.2 07/25/2011 2213   NITRITE NEGATIVE 03/03/2020 1815   LEUKOCYTESUR TRACE (A) 03/03/2020 1815    Lab Results  Component Value Date   LABMICR See below: 08/15/2019   WBCUA 0-5 08/15/2019   RBCUA 11-30 (A) 11/13/2017   LABEPIT 0-10 08/15/2019   MUCUS negative 03/15/2015   BACTERIA RARE (A) 03/03/2020    Pertinent Imaging: CT abd/pelvis 02/27/2020: Images reviewed and discussed with the patient Results for orders placed during the hospital encounter of 08/04/13  DG Abd 1  View  Narrative CLINICAL DATA:  History of renal stones.  EXAM: ABDOMEN - 1 VIEW  COMPARISON:  Abdomen radiograph 07/04/2013.  FINDINGS: Multiple wires and leads overlie the expected location of the left kidney, limiting evaluation. Multiple punctate calcific densities are demonstrated within the expected location of the mid and lower pole of the right kidney, slightly decreased from prior exam. No definite stone demonstrated projecting over the course of the right ureter. Limited visualization of the left kidney demonstrates multiple tiny punctate densities projecting over the mid to lower pole.  Extensive lower lumbar spine degenerative change. Nonobstructed bowel gas pattern.  IMPRESSION: Several tiny stones project over the mid and lower pole of the right kidney.  Several tiny stones project over the mid and lower pole of the left kidney, poorly visualized due to overlying wires and leads.  Electronically Signed By: Lovey Newcomer M.D. On: 08/04/2013 11:58  No results found for this or any previous visit.  No results found for this or any previous visit.  No results found for this or any previous visit.  Results for orders placed during the hospital encounter of 12/18/19  US RENAL  Narrative CLINICAL DATA:  Stage III B chronic kidney disease  EXAM: RENAL / URINARY TRACT ULTRASOUND COMPLETE  COMPARISON:  CTA 11/18/2019  FINDINGS: Right Kidney:  Renal measurements: 9.5 x 4.1 x 4.2 cm = volume: 86 mL. Renal cortical echogenicity is normal. There is marked renal cortical thinning, however. No hydronephrosis. A 7 mm calculus is seen within the interpolar region of the right kidney. No intrarenal masses are seen.  Left Kidney:  Renal measurements: 8.6 x 3.2 x 3.4 cm = volume: 49 mL. Renal cortical echogenicity is normal. There is marked renal cortical thinning, however. No hydronephrosis. A 6 mm nonobstructing calculus is seen within the lower pole. No  enhancing intrarenal masses.  Bladder:  Appears normal for degree of bladder distention.  Other:  None.  IMPRESSION: Marked bilateral renal cortical atrophy.  Bilateral nonobstructing nephrolithiasis.   Electronically Signed By: Fidela Salisbury MD On: 12/19/2019 03:46  No results found for this or any previous visit.  No results found for this or any previous visit.  Results for orders placed during the hospital encounter of 11/14/19  CT Renal Stone Study  Narrative CLINICAL DATA:  Lower abdominal pain with dizziness and hematuria.  EXAM: CT ABDOMEN AND PELVIS WITHOUT CONTRAST  TECHNIQUE: Multidetector CT imaging of the abdomen and pelvis was performed following the standard protocol without IV contrast.  COMPARISON:  July 25, 2019  FINDINGS: Lower chest: No acute abnormality.  Hepatobiliary: No focal liver abnormality is seen. No gallstones, gallbladder wall thickening, or biliary dilatation.  Pancreas: Unremarkable. No pancreatic ductal dilatation or surrounding inflammatory changes.  Spleen: Normal in size without focal abnormality.  Adrenals/Urinary Tract: Adrenal glands are unremarkable. The left kidney is asymmetrically small when compared to a normal size right kidney. A 1.4 cm x 0.8 cm area of low attenuation is seen within the anterior aspect of the lower pole of the right kidney. Numerous a subcentimeter non-obstructing renal stones of various sizes are again seen throughout both kidneys very mild bilateral hydronephrosis and hydroureter are seen without evidence of obstructing renal calculi. Bladder is unremarkable.  Stomach/Bowel: There is a small hiatal hernia. Very mild appendiceal wall thickening is noted. No significant periappendiceal inflammatory fat stranding is seen. No evidence of bowel wall thickening, distention, or inflammatory changes.  Vascular/Lymphatic: There is moderate to marked severity calcification and tortuosity of the  abdominal aorta and bilateral common iliac arteries, without evidence of aneurysmal dilatation. No enlarged abdominal or pelvic lymph nodes.  Reproductive: Uterus and bilateral adnexa are unremarkable.  Other: No abdominal wall hernia or abnormality. No abdominopelvic ascites.  Musculoskeletal: There is moderate to marked severity levoscoliosis of the lumbar spine with marked severity degenerative changes seen throughout the lumbar spine.  IMPRESSION: 1. Mild appendiceal wall thickening, without periappendiceal inflammatory fat stranding. Early appendicitis cannot completely be excluded. 2. Numerous bilateral subcentimeter non-obstructing renal stones. 3. Small hiatal hernia. 4. Moderate to marked severity levoscoliosis of the lumbar spine with marked severity degenerative changes throughout the lumbar spine. 5. Aortic atherosclerosis.  Aortic Atherosclerosis (ICD10-I70.0).   Electronically Signed By: Virgina Norfolk M.D. On: 11/15/2019 01:50   Assessment & Plan:    1. Kidney stones -We discussed the  management of kidney stones. These options include observation, ureteroscopy, shockwave lithotripsy (ESWL) and percutaneous nephrolithotomy (PCNL). We discussed which options are relevant to the patient's stone(s). We discussed the natural history of kidney stones as well as the complications of untreated stones and the impact on quality of life without treatment as well as with each of the above listed treatments. We also discussed the efficacy of each treatment in its ability to clear the stone burden. With any of these management options I discussed the signs and symptoms of infection and the need for emergent treatment should these be experienced. For each option we discussed the ability of each procedure to clear the patient of their stone burden.   For observation I described the risks which include but are not limited to silent renal damage, life-threatening infection, need  for emergent surgery, failure to pass stone and pain.   For ureteroscopy I described the risks which include bleeding, infection, damage to contiguous structures, positioning injury, ureteral stricture, ureteral avulsion, ureteral injury, need for prolonged ureteral stent, inability to perform ureteroscopy, need for an interval procedure, inability to clear stone burden, stent discomfort/pain, heart attack, stroke, pulmonary embolus and the inherent risks with general anesthesia.   For shockwave lithotripsy I described the risks which include arrhythmia, kidney contusion, kidney hemorrhage, need for transfusion, pain, inability to adequately break up stone, inability to pass stone fragments, Steinstrasse, infection associated with obstructing stones, need for alternate surgical procedure, need for repeat shockwave lithotripsy, MI, CVA, PE and the inherent risks with anesthesia/conscious sedation.   For PCNL I described the risks including positioning injury, pneumothorax, hydrothorax, need for chest tube, inability to clear stone burden, renal laceration, arterial venous fistula or malformation, need for embolization of kidney, loss of kidney or renal function, need for repeat procedure, need for prolonged nephrostomy tube, ureteral avulsion, MI, CVA, PE and the inherent risks of general anesthesia.   - The patient would like to proceed with medical expulsive therapy. KUB today, will call with results. Rx for flomax, norco and zofran     No follow-ups on file.  Nicolette Bang, MD  [facility] [place]

## 2020-03-10 NOTE — Patient Instructions (Signed)

## 2020-03-11 ENCOUNTER — Telehealth: Payer: Self-pay

## 2020-03-11 NOTE — Telephone Encounter (Signed)
Left message to return call 

## 2020-03-11 NOTE — Telephone Encounter (Signed)
Son returned called and made aware.

## 2020-03-11 NOTE — Progress Notes (Signed)
Attempted to call. No answer. Results sent via my chart

## 2020-03-11 NOTE — Telephone Encounter (Signed)
-----   Message from Cleon Gustin, MD sent at 03/11/2020  7:48 AM EST ----- KUB shows the right ureteral calculus has moved slightly since her CT. We should continue medical expulsive therapy and she should see me in 2 weeks ----- Message ----- From: Dorisann Frames, RN Sent: 03/10/2020   1:20 PM EST To: Cleon Gustin, MD  Please review

## 2020-03-13 ENCOUNTER — Other Ambulatory Visit (HOSPITAL_COMMUNITY): Payer: Self-pay | Admitting: Physician Assistant

## 2020-03-13 ENCOUNTER — Other Ambulatory Visit: Payer: Self-pay | Admitting: Family Medicine

## 2020-03-15 ENCOUNTER — Telehealth: Payer: Self-pay | Admitting: Family Medicine

## 2020-03-15 NOTE — Telephone Encounter (Signed)
Pt has kidney stones and is having stomach pains - unable to eat. Can't have surgery because of defibrillator. Wants to know what she can do for pain or to help her eat. Did not want appt.

## 2020-03-16 NOTE — Telephone Encounter (Signed)
As we've discussed many times before, needs to see GI

## 2020-03-16 NOTE — Telephone Encounter (Signed)
If she is having kidney stones, needs to see a urologist

## 2020-03-16 NOTE — Telephone Encounter (Signed)
Pt doesn't think it is gallbladder, she says it is upper abd pain and it gets worse when she eats. She is down to 120 lbs

## 2020-03-17 ENCOUNTER — Telehealth: Payer: Self-pay | Admitting: Internal Medicine

## 2020-03-17 NOTE — Telephone Encounter (Signed)
Patient aware.  She has seen Hurshel Keys in [place] in the past, just a few months ago and would like for [place] to set her up with him again.

## 2020-03-17 NOTE — Telephone Encounter (Signed)
Just needs to call the office. No referral needed

## 2020-03-17 NOTE — Telephone Encounter (Signed)
Spoke with patient and advised her to go contact GI, Dr. Hurshel Keys.  Gave her phone number to call.

## 2020-03-17 NOTE — Telephone Encounter (Signed)
Returned the pt's call and was advised for the past 2 weeks (especially) her abdomen has been hurting @ the top area. She also states a lot of pressure in her rectum when she stands up. Has some nausea (no vomiting), some fatigued noted. Was advised once before it could be bacteria in her colon. Also states she was sent to the ER 2 weeks ago by her kidney Dr and found that the pt had 2 kidney stones. Thinks it may/may not be related. Pt states its so much pressure on her rectum that its causing her heart to really beat too fast. Wants to know what she can do from home to rectify this? Please advise.

## 2020-03-17 NOTE — Telephone Encounter (Signed)
Pt was seen by [place] on 01/15/2020 and said her condition has gotten worse. I offered her OV with Dr Abbey Chatters for 4/6, but she doesn't want to wait that long. I told her that I could put her on a cancellation list, but she didn't want to do that. I explained to her that all the extenders were scheduled farther out and Dr Ave Filter April 6 spot was the first available. She wants to speak with the nurse and see what Dr Abbey Chatters recommends for her do to. I also told her there's GI coverage at the hospital. Please call [phone]

## 2020-03-22 ENCOUNTER — Telehealth: Payer: Self-pay

## 2020-03-22 NOTE — Telephone Encounter (Signed)
Returned call from pt earlier today. Pt states she is hurting badly from kidney stone even with pain med. She can't keep any food down and is throwing up. Made pt appointment for Wednesday  March 9th at 1:00.

## 2020-03-23 ENCOUNTER — Other Ambulatory Visit: Payer: Self-pay

## 2020-03-23 ENCOUNTER — Other Ambulatory Visit: Payer: Medicare Other

## 2020-03-23 ENCOUNTER — Encounter: Payer: Self-pay | Admitting: Family Medicine

## 2020-03-23 DIAGNOSIS — Z5181 Encounter for therapeutic drug level monitoring: Secondary | ICD-10-CM | POA: Diagnosis not present

## 2020-03-23 DIAGNOSIS — I480 Paroxysmal atrial fibrillation: Secondary | ICD-10-CM | POA: Diagnosis not present

## 2020-03-23 DIAGNOSIS — Z79899 Other long term (current) drug therapy: Secondary | ICD-10-CM | POA: Diagnosis not present

## 2020-03-23 NOTE — Telephone Encounter (Signed)
noted 

## 2020-03-23 NOTE — Telephone Encounter (Signed)
I called and spoke with both patient and her granddaughter for an extensive amount of time.  She has an obstructive kidney stone that is currently being followed by urology.  Her GI issues have been worked up extensively in the past and have been negative.  I recommended continued follow-up with urology.  If her symptoms do not improve after resolution of her kidney stone, we can revisit her GI complaints.  She already has follow-up with me in April which she will keep.  Thank you

## 2020-03-23 NOTE — Telephone Encounter (Signed)
Did you ever contact and speak with this pt yet?

## 2020-03-24 ENCOUNTER — Ambulatory Visit (HOSPITAL_COMMUNITY)
Admission: RE | Admit: 2020-03-24 | Discharge: 2020-03-24 | Disposition: A | Discharge status: Home / Self Care | Payer: Medicare Other | Source: Ambulatory Visit | Attending: Urology | Admitting: Urology

## 2020-03-24 ENCOUNTER — Other Ambulatory Visit: Payer: Self-pay

## 2020-03-24 ENCOUNTER — Encounter: Payer: Self-pay | Admitting: *Deleted

## 2020-03-24 ENCOUNTER — Ambulatory Visit (INDEPENDENT_AMBULATORY_CARE_PROVIDER_SITE_OTHER): Payer: Medicare Other | Admitting: Urology

## 2020-03-24 ENCOUNTER — Encounter: Payer: Self-pay | Admitting: Urology

## 2020-03-24 ENCOUNTER — Emergency Department (HOSPITAL_COMMUNITY)
Admission: EM | Admit: 2020-03-24 | Discharge: 2020-03-25 | Disposition: A | Discharge status: Home / Self Care | Payer: Medicare Other | Source: Home / Self Care | Attending: Emergency Medicine | Admitting: Emergency Medicine

## 2020-03-24 ENCOUNTER — Encounter (HOSPITAL_COMMUNITY): Payer: Self-pay

## 2020-03-24 VITALS — BP 119/70 | HR 86 | Temp 98.3°F | Ht 63.0 in | Wt 130.0 lb

## 2020-03-24 DIAGNOSIS — R1084 Generalized abdominal pain: Secondary | ICD-10-CM | POA: Insufficient documentation

## 2020-03-24 DIAGNOSIS — I25119 Atherosclerotic heart disease of native coronary artery with unspecified angina pectoris: Secondary | ICD-10-CM

## 2020-03-24 DIAGNOSIS — Z8616 Personal history of COVID-19: Secondary | ICD-10-CM | POA: Insufficient documentation

## 2020-03-24 DIAGNOSIS — R1030 Lower abdominal pain, unspecified: Secondary | ICD-10-CM

## 2020-03-24 DIAGNOSIS — Z7901 Long term (current) use of anticoagulants: Secondary | ICD-10-CM | POA: Insufficient documentation

## 2020-03-24 DIAGNOSIS — Z91040 Latex allergy status: Secondary | ICD-10-CM | POA: Insufficient documentation

## 2020-03-24 DIAGNOSIS — I482 Chronic atrial fibrillation, unspecified: Secondary | ICD-10-CM | POA: Diagnosis not present

## 2020-03-24 DIAGNOSIS — E871 Hypo-osmolality and hyponatremia: Secondary | ICD-10-CM | POA: Diagnosis not present

## 2020-03-24 DIAGNOSIS — N261 Atrophy of kidney (terminal): Secondary | ICD-10-CM | POA: Diagnosis not present

## 2020-03-24 DIAGNOSIS — E872 Acidosis: Secondary | ICD-10-CM | POA: Diagnosis not present

## 2020-03-24 DIAGNOSIS — N183 Chronic kidney disease, stage 3 unspecified: Secondary | ICD-10-CM | POA: Insufficient documentation

## 2020-03-24 DIAGNOSIS — E039 Hypothyroidism, unspecified: Secondary | ICD-10-CM | POA: Insufficient documentation

## 2020-03-24 DIAGNOSIS — R0902 Hypoxemia: Secondary | ICD-10-CM | POA: Diagnosis not present

## 2020-03-24 DIAGNOSIS — R1031 Right lower quadrant pain: Secondary | ICD-10-CM

## 2020-03-24 DIAGNOSIS — R109 Unspecified abdominal pain: Secondary | ICD-10-CM | POA: Diagnosis not present

## 2020-03-24 DIAGNOSIS — R1011 Right upper quadrant pain: Secondary | ICD-10-CM | POA: Diagnosis not present

## 2020-03-24 DIAGNOSIS — Z86718 Personal history of other venous thrombosis and embolism: Secondary | ICD-10-CM | POA: Insufficient documentation

## 2020-03-24 DIAGNOSIS — K551 Chronic vascular disorders of intestine: Secondary | ICD-10-CM | POA: Diagnosis not present

## 2020-03-24 DIAGNOSIS — Z79899 Other long term (current) drug therapy: Secondary | ICD-10-CM | POA: Insufficient documentation

## 2020-03-24 DIAGNOSIS — N200 Calculus of kidney: Secondary | ICD-10-CM | POA: Diagnosis not present

## 2020-03-24 DIAGNOSIS — N29 Other disorders of kidney and ureter in diseases classified elsewhere: Secondary | ICD-10-CM | POA: Diagnosis not present

## 2020-03-24 DIAGNOSIS — I248 Other forms of acute ischemic heart disease: Secondary | ICD-10-CM | POA: Diagnosis not present

## 2020-03-24 DIAGNOSIS — I129 Hypertensive chronic kidney disease with stage 1 through stage 4 chronic kidney disease, or unspecified chronic kidney disease: Secondary | ICD-10-CM | POA: Insufficient documentation

## 2020-03-24 DIAGNOSIS — I10 Essential (primary) hypertension: Secondary | ICD-10-CM | POA: Diagnosis not present

## 2020-03-24 DIAGNOSIS — K219 Gastro-esophageal reflux disease without esophagitis: Secondary | ICD-10-CM | POA: Insufficient documentation

## 2020-03-24 DIAGNOSIS — E1122 Type 2 diabetes mellitus with diabetic chronic kidney disease: Secondary | ICD-10-CM | POA: Insufficient documentation

## 2020-03-24 DIAGNOSIS — N132 Hydronephrosis with renal and ureteral calculous obstruction: Secondary | ICD-10-CM | POA: Diagnosis not present

## 2020-03-24 DIAGNOSIS — Z20822 Contact with and (suspected) exposure to covid-19: Secondary | ICD-10-CM | POA: Diagnosis not present

## 2020-03-24 DIAGNOSIS — K449 Diaphragmatic hernia without obstruction or gangrene: Secondary | ICD-10-CM | POA: Diagnosis not present

## 2020-03-24 DIAGNOSIS — M419 Scoliosis, unspecified: Secondary | ICD-10-CM | POA: Diagnosis not present

## 2020-03-24 LAB — URINALYSIS, ROUTINE W REFLEX MICROSCOPIC
Bilirubin, UA: NEGATIVE
Glucose, UA: NEGATIVE
Ketones, UA: NEGATIVE
Leukocytes,UA: NEGATIVE
Nitrite, UA: NEGATIVE
Specific Gravity, UA: 1.015 (ref 1.005–1.030)
Urobilinogen, Ur: 0.2 mg/dL (ref 0.2–1.0)
pH, UA: 7.5 (ref 5.0–7.5)

## 2020-03-24 LAB — CBC WITH DIFFERENTIAL/PLATELET
Abs Immature Granulocytes: 0.02 10*3/uL (ref 0.00–0.07)
Basophils Absolute: 0.1 10*3/uL (ref 0.0–0.1)
Basophils Relative: 1 %
Eosinophils Absolute: 0.3 10*3/uL (ref 0.0–0.5)
Eosinophils Relative: 7 %
HCT: 34.3 % — ABNORMAL LOW (ref 36.0–46.0)
Hemoglobin: 11.3 g/dL — ABNORMAL LOW (ref 12.0–15.0)
Immature Granulocytes: 0 %
Lymphocytes Relative: 24 %
Lymphs Abs: 1.2 10*3/uL (ref 0.7–4.0)
MCH: 30.5 pg (ref 26.0–34.0)
MCHC: 32.9 g/dL (ref 30.0–36.0)
MCV: 92.5 fL (ref 80.0–100.0)
Monocytes Absolute: 0.8 10*3/uL (ref 0.1–1.0)
Monocytes Relative: 16 %
Neutro Abs: 2.5 10*3/uL (ref 1.7–7.7)
Neutrophils Relative %: 52 %
Platelets: 327 10*3/uL (ref 150–400)
RBC: 3.71 MIL/uL — ABNORMAL LOW (ref 3.87–5.11)
RDW: 14 % (ref 11.5–15.5)
WBC: 4.9 10*3/uL (ref 4.0–10.5)
nRBC: 0 % (ref 0.0–0.2)

## 2020-03-24 LAB — COMPREHENSIVE METABOLIC PANEL
ALT: 56 IU/L — ABNORMAL HIGH (ref 0–32)
ALT: 45 U/L — ABNORMAL HIGH (ref 0–44)
AST: 62 IU/L — ABNORMAL HIGH (ref 0–40)
AST: 45 U/L — ABNORMAL HIGH (ref 15–41)
Albumin/Globulin Ratio: 1.1 — ABNORMAL LOW (ref 1.2–2.2)
Albumin: 3.4 g/dL — ABNORMAL LOW (ref 3.6–4.6)
Albumin: 3.1 g/dL — ABNORMAL LOW (ref 3.5–5.0)
Alkaline Phosphatase: 93 IU/L (ref 44–121)
Alkaline Phosphatase: 69 U/L (ref 38–126)
Anion gap: 9 (ref 5–15)
BUN/Creatinine Ratio: 10 — ABNORMAL LOW (ref 12–28)
BUN: 12 mg/dL (ref 8–27)
BUN: 18 mg/dL (ref 8–23)
Bilirubin Total: 0.5 mg/dL (ref 0.0–1.2)
CO2: 22 mmol/L (ref 20–29)
CO2: 24 mmol/L (ref 22–32)
Calcium: 9 mg/dL (ref 8.7–10.3)
Calcium: 8.9 mg/dL (ref 8.9–10.3)
Chloride: 98 mmol/L (ref 96–106)
Chloride: 98 mmol/L (ref 98–111)
Creatinine, Ser: 1.17 mg/dL — ABNORMAL HIGH (ref 0.57–1.00)
Creatinine, Ser: 1.05 mg/dL — ABNORMAL HIGH (ref 0.44–1.00)
GFR, Estimated: 52 mL/min — ABNORMAL LOW (ref >60)
Globulin, Total: 3 g/dL (ref 1.5–4.5)
Glucose, Bld: 117 mg/dL — ABNORMAL HIGH (ref 70–99)
Glucose: 100 mg/dL — ABNORMAL HIGH (ref 65–99)
Potassium: 4.3 mmol/L (ref 3.5–5.2)
Potassium: 3.6 mmol/L (ref 3.5–5.1)
Sodium: 137 mmol/L (ref 134–144)
Sodium: 131 mmol/L — ABNORMAL LOW (ref 135–145)
Total Bilirubin: 0.7 mg/dL (ref 0.3–1.2)
Total Protein: 6.4 g/dL (ref 6.0–8.5)
Total Protein: 6.5 g/dL (ref 6.5–8.1)
eGFR: 45 mL/min/{1.73_m2} — ABNORMAL LOW (ref >59)

## 2020-03-24 LAB — MICROSCOPIC EXAMINATION
Epithelial Cells (non renal): >10 /hpf — AB (ref 0–10)
Renal Epithel, UA: NONE SEEN /hpf

## 2020-03-24 LAB — TSH: TSH: 1.83 u[IU]/mL (ref 0.450–4.500)

## 2020-03-24 LAB — LIPASE, BLOOD: Lipase: 33 U/L (ref 11–51)

## 2020-03-24 MED ORDER — HYDROCODONE-ACETAMINOPHEN 5-325 MG PO TABS
1.0000 | ORAL_TABLET | Freq: Once | ORAL | Status: AC
  Administered 2020-03-24: 1 via ORAL
  Filled 2020-03-24: qty 1

## 2020-03-24 MED ORDER — MORPHINE SULFATE (PF) 2 MG/ML IV SOLN
2.0000 mg | Freq: Once | INTRAVENOUS | Status: AC
  Administered 2020-03-24: 2 mg via INTRAVENOUS
  Filled 2020-03-24: qty 1

## 2020-03-24 NOTE — Progress Notes (Signed)
03/24/2020 1:28 PM   [name] 1931/11/19 [id number]  Referring provider: [name], DO [address],  [place] [address]  followup nephrolithiasis  HPI: [name] is a [id number] here for followup for nephrolithiasis. She has not passed her calculus. She has diffuse abdominal pain that is constant. She denies nausea/vomiting. No fevers.    PMH: Past Medical History:  Diagnosis Date  . AICD (automatic cardioverter/defibrillator) present   . Allergy    SESONAL  . Anxiety   . ARTHRITIS   . Arthritis   . Atrial fibrillation ([facility])   . CAD (coronary artery disease)    a. history of cardiac arrest 1993;  b. s/p LAD/LCX stenting in 2003;  c. 07/2011 Cath: LM nl, LAD patent stent, D1 80ost, LCX patent stent, RCA min irregs;  d. 04/2012 MV: EF 66%, no ishcemia;  e. 12/2013 Echo: Ef 55%, no rwma, Gr 1 DD, triv AI/MR, mildly dil LA;  f. 12/2013 Lexi MV: intermediate risk - apical ischemia and inf/infsept fixed defect, ? artifactual.  . CAD in native artery 12/31/2013   Previous stents to LAD and Ramus patent on cath today 12/31/13 also There is severe disease in the ostial first diagonal which is unchanged from most recent cardiac catheterization. The right coronary artery could not be engaged selectively but nonselective angiography showed no significant disease in the proximal and midsegment.     . Cataract    DENIES  . Chronic back pain   . Chronic sinus bradycardia   . CKD (chronic kidney disease), stage III ([place])   . Clotting disorder ([facility])    DVT  . COLITIS 12/02/2007   Qualifier: Diagnosis of  By: Nils Pyle CMA ([place]), [name]    . Diabetes mellitus without complication ([facility])    DENIES  . Diverticulosis of colon (without mention of hemorrhage) 2007   Colonoscopy   . Esophageal stricture    a. 2012 s/p dil.  . Esophagitis, unspecified    a. 2012 EGD  . EXTERNAL HEMORRHOIDS   . GERD (gastroesophageal reflux disease)    omeprazole  . H/O hiatal hernia   . Hiatal  hernia    a. 2012 EGD.  Marland Kitchen History of DVT in the past, not on Coumadin now    left  leg  . HYPERCHOLESTEROLEMIA   . Hypertension   . ICD (implantable cardiac defibrillator) in place    a. s/p initial ICD in 1993 in setting of cardiac arrest;  b. 01/2007 gen change: Guidant T135 Vitality DS VR single lead ICD.  . Macular degeneration    gets injecton in eye every 5 weeks- last injection - 05/03/2013   . Myocardial infarction ([facility]) 1993  . Near syncope 05/22/2015  . Nephrolithiasis, just saw Dr [name]   . Orthostatic hypotension   . Peripheral vascular disease ([facility])    ???  . RECTAL BLEEDING 12/03/2007   Qualifier: Diagnosis of  By: Sharlett Iles MD Byrd Hesselbach   . Unspecified gastritis and gastroduodenitis without mention of hemorrhage    a. 2003 EGD->not noted on 2012 EGD.  Marland Kitchen Unstable angina, neg MI, cath stable.maybe GI 12/30/2013    Surgical History: Past Surgical History:  Procedure Laterality Date  . BREAST BIOPSY Left 11/2013  . CARDIAC CATHETERIZATION    . CARDIAC CATHETERIZATION  01/01/15   patent stents -there is severe disease in ostial 1st diag but without change.   Marland Kitchen CARDIAC DEFIBRILLATOR PLACEMENT  02/05/2007   Guidant  . CARDIOVASCULAR STRESS TEST  12/30/2013   abnormal  . CARDIOVERSION N/A 02/13/2019   Procedure: CARDIOVERSION;  Surgeon: Sanda Klein, MD;  Location: [facility];  Service: Cardiovascular;  Laterality: N/A;  . CATARACT EXTRACTION Right    Dr. Katy Fitch  . COLONOSCOPY    . coronary stents     . CYSTOSCOPY WITH URETEROSCOPY Right 05/20/2013   Procedure: CYSTOSCOPY, RIGHT URETEROSCOPY STONE EXTRACTION, Insertion of right DOUBLE J STENT ;  Surgeon: Irine Seal, MD;  Location: [facility];  Service: Urology;  Laterality: Right;  . CYSTOSCOPY WITH URETEROSCOPY AND STENT PLACEMENT N/A 06/03/2013   Procedure: SECOND LOOK CYSTOSCOPY WITH URETEROSCOPY  HOLMIUM LASER LITHO AND STONE EXTRACTION Sammie Bench ;  Surgeon: Malka So, MD;  Location: [facility];  Service: Urology;   Laterality: N/A;  . ESOPHAGOGASTRODUODENOSCOPY (EGD) WITH PROPOFOL N/A 11/20/2019   Procedure: ESOPHAGOGASTRODUODENOSCOPY (EGD) WITH PROPOFOL;  Surgeon: Daneil Dolin, MD;  [facility];  Service: Endoscopy;  Laterality: N/A;  . GIVENS CAPSULE STUDY N/A 10/30/2016   Procedure: GIVENS CAPSULE STUDY;  Surgeon: Irene Shipper, MD;  Location: [facility];  Service: Endoscopy;  Laterality: N/A;  NEEDS TO BE ADMITTED FOR OBSERVATION PT. HAS DEFIB  . HEMORROIDECTOMY  80's  . HOLMIUM LASER APPLICATION Right [id number]   Procedure: HOLMIUM LASER APPLICATION;  Surgeon: Irine Seal, MD;  Location: [facility];  Service: Urology;  Laterality: Right;  . HYSTEROSCOPY WITH D & C  09/02/2010   Procedure: DILATATION AND CURETTAGE (D&C) /HYSTEROSCOPY;  Surgeon: Margarette Asal;  Location: [facility];  Service: Gynecology;  Laterality: N/A;  Dilation and Curettage with Hysteroscopy and Polypectomy  . IMPLANTABLE CARDIOVERTER DEFIBRILLATOR (ICD) GENERATOR CHANGE N/A 02/10/2014   Procedure: ICD GENERATOR CHANGE;  Surgeon: Sanda Klein, MD;  Location: [facility];  Service: Cardiovascular;  Laterality: N/A;  . LEFT HEART CATHETERIZATION WITH CORONARY ANGIOGRAM N/A 07/28/2011   Procedure: LEFT HEART CATHETERIZATION WITH CORONARY ANGIOGRAM;  Surgeon: Lorretta Harp, MD;  Location: [facility];  Service: Cardiovascular;  Laterality: N/A;  . LEFT HEART CATHETERIZATION WITH CORONARY ANGIOGRAM N/A 12/31/2013   Procedure: LEFT HEART CATHETERIZATION WITH CORONARY ANGIOGRAM;  Surgeon: Wellington Hampshire, MD;  Location: [facility];  Service: Cardiovascular;  Laterality: N/A;    Home Medications:  Allergies as of 03/24/2020      Reactions   Sotalol Other (See Comments)   Torsades   Bactrim [sulfamethoxazole-trimethoprim]    Itchy rash   Ciprofloxacin Nausea Only   Tramadol Other (See Comments)   Hallucination, bad-dream   Actos [pioglitazone] Swelling   Adhesive [tape] Other (See Comments)   Causes sores    Atorvastatin Other (See Comments)   myalgia   Contrast Media [iodinated Diagnostic Agents] Other (See Comments)   Headache (no action/pre-med required)   Latex Rash   Pedi-pre Tape Spray [wound Dressing Adhesive] Other (See Comments)   Causes sores   Sulfa Antibiotics Other (See Comments)   Causes sores      Medication List       Accurate as of March 24, 2020  1:28 PM. If you have any questions, ask your nurse or doctor.        STOP taking these medications   traMADol 50 MG tablet Commonly known as: ULTRAM Stopped by: Nicolette Bang, MD     TAKE these medications   acetaminophen 500 MG tablet Commonly known as: TYLENOL Take 500 mg by mouth every 6 (six) hours as needed.   amiodarone 200 MG tablet Commonly known as: PACERONE TAKE 1 TABLET  DAILY   apixaban 2.5 MG Tabs tablet Commonly known as: ELIQUIS Take 1 tablet (2.5 mg total) by mouth 2 (two) times daily.   carboxymethylcellulose 0.5 % Soln Commonly known as: REFRESH PLUS Place 1 drop into both eyes 3 (three) times daily as needed (dry/irritated eyes.).   CENTRUM SILVER ULTRA WOMENS PO Take 1 tablet by mouth daily.   diphenoxylate-atropine 2.5-0.025 MG tablet Commonly known as: LOMOTIL TAKE 1 TABLET EVERY 8 HOURS AS NEEDED FOR DIARRHEA   feeding supplement Liqd Take 237 mLs by mouth 2 (two) times daily between meals.   hydrALAZINE 25 MG tablet Commonly known as: APRESOLINE Take 25 mg by mouth 3 (three) times daily.   HYDROcodone-acetaminophen 5-325 MG tablet Commonly known as: Norco Take 1 tablet by mouth every 4 (four) hours as needed for moderate pain.   isosorbide mononitrate 30 MG 24 hr tablet Commonly known as: IMDUR TAKE ONE HALF (1/2) TABLET DAILY What changed: See the new instructions.   levothyroxine 100 MCG tablet Commonly known as: SYNTHROID Take 1 tablet (100 mcg total) by mouth daily.   loratadine 10 MG tablet Commonly known as: CLARITIN Take 1 tablet (10 mg total) by mouth daily.  (for itching)   meclizine 25 MG tablet Commonly known as: ANTIVERT Take 25 mg by mouth 3 (three) times daily as needed for dizziness or nausea.   metoprolol tartrate 25 MG tablet Commonly known as: LOPRESSOR Take by mouth.   nitroGLYCERIN 0.4 MG SL tablet Commonly known as: NITROSTAT Place 1 tablet (0.4 mg total) under the tongue every 5 (five) minutes as needed for chest pain.   ondansetron 4 MG tablet Commonly known as: ZOFRAN Take 1 tablet (4 mg total) by mouth every 6 (six) hours as needed for nausea or vomiting.   pantoprazole 40 MG tablet Commonly known as: PROTONIX TAKE 1 TABLET ONCE DAILY   PARoxetine 20 MG tablet Commonly known as: PAXIL TAKE (1) TABLET DAILY IN THE MORNING.   rosuvastatin 10 MG tablet Commonly known as: CRESTOR Take 1 tablet (10 mg total) by mouth daily.   tamsulosin 0.4 MG Caps capsule Commonly known as: FLOMAX Take 1 capsule (0.4 mg total) by mouth daily.   Vitamin D 50 MCG (2000 UT) Caps Take 2,000 Units by mouth daily.       Allergies:  Allergies  Allergen Reactions  . Sotalol Other (See Comments)    Torsades   . Bactrim [Sulfamethoxazole-Trimethoprim]     Itchy rash  . Ciprofloxacin Nausea Only  . Tramadol Other (See Comments)    Hallucination, bad-dream  . Actos [Pioglitazone] Swelling  . Adhesive [Tape] Other (See Comments)    Causes sores  . Atorvastatin Other (See Comments)    myalgia  . Contrast Media [Iodinated Diagnostic Agents] Other (See Comments)    Headache (no action/pre-med required)  . Latex Rash  . Pedi-Pre Tape Spray [Wound Dressing Adhesive] Other (See Comments)    Causes sores  . Sulfa Antibiotics Other (See Comments)    Causes sores    Family History: Family History  Problem Relation Age of Onset  . Stroke Mother   . Other Mother        brain tumor  . Other Father        MI  . Heart attack Father   . Stroke Sister   . Macular degeneration Sister   . Diabetes Daughter   . Cancer Daughter         ovarian  . Colon polyps Daughter   .  Atrial fibrillation Sister   . Hyperlipidemia Sister   . Osteoporosis Sister   . Stroke Sister   . Uterine cancer Sister   . Heart attack Brother   . Heart disease Brother   . Asthma Brother   . Breast cancer Neg Hx     Social History:  reports that she has never smoked. She has never used smokeless tobacco. She reports that she does not drink alcohol and does not use drugs.  ROS: All other review of systems were reviewed and are negative except what is noted above in HPI  Physical Exam: BP 119/70   Pulse 86   Temp 98.3 F (36.8 C)   Ht '5\' 3"'$  (1.6 m)   Wt 130 lb (59 kg)   BMI 23.03 kg/m   Constitutional:  Alert and oriented, No acute distress. HEENT: [place] AT, moist mucus membranes.  Trachea midline, no masses. Cardiovascular: No clubbing, cyanosis, or edema. Respiratory: Normal respiratory effort, no increased work of breathing. GI: Abdomen is soft, nontender, nondistended, no abdominal masses GU: No CVA tenderness.  Lymph: No cervical or inguinal lymphadenopathy. Skin: No rashes, bruises or suspicious lesions. Neurologic: Grossly intact, no focal deficits, moving all 4 extremities. Psychiatric: Normal mood and affect.  Laboratory Data: Lab Results  Component Value Date   WBC 5.6 03/03/2020   HGB 10.3 (L) 03/03/2020   HCT 32.2 (L) 03/03/2020   MCV 96.7 03/03/2020   PLT 295 03/03/2020    Lab Results  Component Value Date   CREATININE 1.17 (H) 03/23/2020    No results found for: PSA  No results found for: TESTOSTERONE  Lab Results  Component Value Date   HGBA1C 6.6 08/15/2019    Urinalysis    Component Value Date/Time   COLORURINE AMBER (A) 03/03/2020 1815   APPEARANCEUR Clear 03/10/2020 1015   LABSPEC 1.014 03/03/2020 1815   PHURINE 6.0 03/03/2020 1815   GLUCOSEU Negative 03/10/2020 1015   HGBUR LARGE (A) 03/03/2020 1815   BILIRUBINUR Negative 03/10/2020 1015   KETONESUR NEGATIVE 03/03/2020 1815   PROTEINUR  2+ (A) 03/10/2020 1015   PROTEINUR 30 (A) 03/03/2020 1815   UROBILINOGEN negative 03/15/2015 1352   UROBILINOGEN 0.2 07/25/2011 2213   NITRITE Negative 03/10/2020 1015   NITRITE NEGATIVE 03/03/2020 1815   LEUKOCYTESUR 1+ (A) 03/10/2020 1015   LEUKOCYTESUR TRACE (A) 03/03/2020 1815    Lab Results  Component Value Date   LABMICR See below: 03/10/2020   WBCUA 6-10 (A) 03/10/2020   RBCUA 11-30 (A) 11/13/2017   LABEPIT >10 (A) 03/10/2020   MUCUS Present 03/10/2020   BACTERIA Moderate (A) 03/10/2020    Pertinent Imaging: KUB today: Images reviewed and discussed with the patient and family Results for orders placed in visit on 03/10/20  Abdomen 1 view (KUB)  Narrative CLINICAL DATA:  Nephrolithiasis  EXAM: ABDOMEN - 1 VIEW  COMPARISON:  02/27/2020, 08/04/2013  FINDINGS: Similar bilateral mid and lower pole subcentimeter nephrolithiasis. Renal shadows are obscured by stool and gas pattern.  Comparing to the prior CT, the previous known right mid ureteral calculus measuring 7 mm at the L4-5 level may still be present without significant migration.  Degenerative changes scoliosis of the spine.  Bones are osteopenic.  IMPRESSION: Suspect similarly positioned right mid ureteral calculus at the L4-5 level.  Additional bilateral mid and lower pole nonobstructing nephrolithiasis.   Electronically Signed By: Jerilynn Mages.  Shick M.D. On: 03/10/2020 11:28  No results found for this or any previous visit.  No results found for this or  any previous visit.  No results found for this or any previous visit.  Results for orders placed during the hospital encounter of 12/18/19  US RENAL  Narrative CLINICAL DATA:  Stage III B chronic kidney disease  EXAM: RENAL / URINARY TRACT ULTRASOUND COMPLETE  COMPARISON:  CTA 11/18/2019  FINDINGS: Right Kidney:  Renal measurements: 9.5 x 4.1 x 4.2 cm = volume: 86 mL. Renal cortical echogenicity is normal. There is marked renal  cortical thinning, however. No hydronephrosis. A 7 mm calculus is seen within the interpolar region of the right kidney. No intrarenal masses are seen.  Left Kidney:  Renal measurements: 8.6 x 3.2 x 3.4 cm = volume: 49 mL. Renal cortical echogenicity is normal. There is marked renal cortical thinning, however. No hydronephrosis. A 6 mm nonobstructing calculus is seen within the lower pole. No enhancing intrarenal masses.  Bladder:  Appears normal for degree of bladder distention.  Other:  None.  IMPRESSION: Marked bilateral renal cortical atrophy.  Bilateral nonobstructing nephrolithiasis.   Electronically Signed By: Fidela Salisbury MD On: 12/19/2019 03:46  No results found for this or any previous visit.  No results found for this or any previous visit.  Results for orders placed during the hospital encounter of 11/14/19  CT Renal Stone Study  Narrative CLINICAL DATA:  Lower abdominal pain with dizziness and hematuria.  EXAM: CT ABDOMEN AND PELVIS WITHOUT CONTRAST  TECHNIQUE: Multidetector CT imaging of the abdomen and pelvis was performed following the standard protocol without IV contrast.  COMPARISON:  July 25, 2019  FINDINGS: Lower chest: No acute abnormality.  Hepatobiliary: No focal liver abnormality is seen. No gallstones, gallbladder wall thickening, or biliary dilatation.  Pancreas: Unremarkable. No pancreatic ductal dilatation or surrounding inflammatory changes.  Spleen: Normal in size without focal abnormality.  Adrenals/Urinary Tract: Adrenal glands are unremarkable. The left kidney is asymmetrically small when compared to a normal size right kidney. A 1.4 cm x 0.8 cm area of low attenuation is seen within the anterior aspect of the lower pole of the right kidney. Numerous a subcentimeter non-obstructing renal stones of various sizes are again seen throughout both kidneys very mild bilateral hydronephrosis and hydroureter are seen without  evidence of obstructing renal calculi. Bladder is unremarkable.  Stomach/Bowel: There is a small hiatal hernia. Very mild appendiceal wall thickening is noted. No significant periappendiceal inflammatory fat stranding is seen. No evidence of bowel wall thickening, distention, or inflammatory changes.  Vascular/Lymphatic: There is moderate to marked severity calcification and tortuosity of the abdominal aorta and bilateral common iliac arteries, without evidence of aneurysmal dilatation. No enlarged abdominal or pelvic lymph nodes.  Reproductive: Uterus and bilateral adnexa are unremarkable.  Other: No abdominal wall hernia or abnormality. No abdominopelvic ascites.  Musculoskeletal: There is moderate to marked severity levoscoliosis of the lumbar spine with marked severity degenerative changes seen throughout the lumbar spine.  IMPRESSION: 1. Mild appendiceal wall thickening, without periappendiceal inflammatory fat stranding. Early appendicitis cannot completely be excluded. 2. Numerous bilateral subcentimeter non-obstructing renal stones. 3. Small hiatal hernia. 4. Moderate to marked severity levoscoliosis of the lumbar spine with marked severity degenerative changes throughout the lumbar spine. 5. Aortic atherosclerosis.  Aortic Atherosclerosis (ICD10-I70.0).   Electronically Signed By: Virgina Norfolk M.D. On: 11/15/2019 01:50   Assessment & Plan:    1. Kidney stones -We discussed the management of kidney stones. These options include observation, ureteroscopy, shockwave lithotripsy (ESWL) and percutaneous nephrolithotomy (PCNL). We discussed which options are relevant to the patient's stone(s). We  discussed the natural history of kidney stones as well as the complications of untreated stones and the impact on quality of life without treatment as well as with each of the above listed treatments. We also discussed the efficacy of each treatment in its ability to clear  the stone burden. With any of these management options I discussed the signs and symptoms of infection and the need for emergent treatment should these be experienced. For each option we discussed the ability of each procedure to clear the patient of their stone burden.   For observation I described the risks which include but are not limited to silent renal damage, life-threatening infection, need for emergent surgery, failure to pass stone and pain.   For ureteroscopy I described the risks which include bleeding, infection, damage to contiguous structures, positioning injury, ureteral stricture, ureteral avulsion, ureteral injury, need for prolonged ureteral stent, inability to perform ureteroscopy, need for an interval procedure, inability to clear stone burden, stent discomfort/pain, heart attack, stroke, pulmonary embolus and the inherent risks with general anesthesia.   For shockwave lithotripsy I described the risks which include arrhythmia, kidney contusion, kidney hemorrhage, need for transfusion, pain, inability to adequately break up stone, inability to pass stone fragments, Steinstrasse, infection associated with obstructing stones, need for alternate surgical procedure, need for repeat shockwave lithotripsy, MI, CVA, PE and the inherent risks with anesthesia/conscious sedation.   For PCNL I described the risks including positioning injury, pneumothorax, hydrothorax, need for chest tube, inability to clear stone burden, renal laceration, arterial venous fistula or malformation, need for embolization of kidney, loss of kidney or renal function, need for repeat procedure, need for prolonged nephrostomy tube, ureteral avulsion, MI, CVA, PE and the inherent risks of general anesthesia.   - The patient would like to proceed with medical expulsive therapy. RTC 2 week with CT stone study.  - Urinalysis, Routine w reflex microscopic   No follow-ups on file.  Nicolette Bang, MD  [facility]  Urology [place]

## 2020-03-24 NOTE — Patient Instructions (Signed)
Textbook [employer] (5th ed., pp. 1518-1527.e3). [place], [place]: [employer].">  Dietary Guidelines to Help Prevent Kidney Stones Kidney stones are deposits of minerals and salts that form inside your kidneys. Your risk of developing kidney stones may be greater depending on your diet, your lifestyle, the medicines you take, and whether you have certain medical conditions. Most people can lower their chances of developing kidney stones by following the instructions below. Your dietitian may give you more specific instructions depending on your overall health and the type of kidney stones you tend to develop. What are tips for following this plan? Reading food labels  Choose foods with "no salt added" or "low-salt" labels. Limit your salt (sodium) intake to less than 1,500 mg a day.  Choose foods with calcium for each meal and snack. Try to eat about 300 mg of calcium at each meal. Foods that contain 200-500 mg of calcium a serving include: ? 8 oz (237 mL) of milk, calcium-fortifiednon-dairy milk, and calcium-fortifiedfruit juice. Calcium-fortified means that calcium has been added to these drinks. ? 8 oz (237 mL) of kefir, yogurt, and soy yogurt. ? 4 oz (114 g) of tofu. ? 1 oz (28 g) of cheese. ? 1 cup (150 g) of dried figs. ? 1 cup (91 g) of cooked broccoli. ? One 3 oz (85 g) can of sardines or mackerel. Most people need 1,000-1,500 mg of calcium a day. Talk to your dietitian about how much calcium is recommended for you.   Shopping  Buy plenty of fresh fruits and vegetables. Most people do not need to avoid fruits and vegetables, even if these foods contain nutrients that may contribute to kidney stones.  When shopping for convenience foods, choose: ? Whole pieces of fruit. ? Pre-made salads with dressing on the side. ? Low-fat fruit and yogurt smoothies.  Avoid buying frozen meals or prepared deli foods. These can be high in sodium.  Look for foods with live cultures, such as  yogurt and kefir.  Choose high-fiber grains, such as whole-wheat breads, oat bran, and wheat cereals. Cooking  Do not add salt to food when cooking. Place a salt shaker on the table and allow each person to add his or her own salt to taste.  Use vegetable protein, such as beans, textured vegetable protein (TVP), or tofu, instead of meat in pasta, casseroles, and soups. Meal planning  Eat less salt, if told by your dietitian. To do this: ? Avoid eating processed or pre-made food. ? Avoid eating fast food.  Eat less animal protein, including cheese, meat, poultry, or fish, if told by your dietitian. To do this: ? Limit the number of times you have meat, poultry, fish, or cheese each week. Eat a diet free of meat at least 2 days a week. ? Eat only one serving each day of meat, poultry, fish, or seafood. ? When you prepare animal protein, cut pieces into small portion sizes. For most meat and fish, one serving is about the size of the palm of your hand.  Eat at least five servings of fresh fruits and vegetables each day. To do this: ? Keep fruits and vegetables on hand for snacks. ? Eat one piece of fruit or a handful of berries with breakfast. ? Have a salad and fruit at lunch. ? Have two kinds of vegetables at dinner.  Limit foods that are high in a substance called oxalate. These include: ? Spinach (cooked), rhubarb, beets, sweet potatoes, and Swiss chard. ? Peanuts. ?   Potato chips, french fries, and baked potatoes with skin on. ? Nuts and nut products. ? Chocolate.  If you regularly take a diuretic medicine, make sure to eat at least 1 or 2 servings of fruits or vegetables that are high in potassium each day. These include: ? Avocado. ? Banana. ? Orange, prune, carrot, or tomato juice. ? Baked potato. ? Cabbage. ? Beans and split peas. Lifestyle  Drink enough fluid to keep your urine pale yellow. This is the most important thing you can do. Spread your fluid intake throughout  the day.  If you drink alcohol: ? Limit how much you use to:  0-1 drink a day for women who are not pregnant.  0-2 drinks a day for men. ? Be aware of how much alcohol is in your drink. In the [place] one drink equals one 12 oz bottle of beer (355 mL), one 5 oz glass of wine (148 mL), or one 1 oz glass of hard liquor (44 mL).  Lose weight if told by your health care provider. Work with your dietitian to find an eating plan and weight loss strategies that work best for you.   General information  Talk to your health care provider and dietitian about taking daily supplements. You may be told the following depending on your health and the cause of your kidney stones: ? Not to take supplements with vitamin C. ? To take a calcium supplement. ? To take a daily probiotic supplement. ? To take other supplements such as magnesium, fish oil, or vitamin B6.  Take over-the-counter and prescription medicines only as told by your health care provider. These include supplements. What foods should I limit? Limit your intake of the following foods, or eat them as told by your dietitian. Vegetables Spinach. Rhubarb. Beets. Canned vegetables. Pickles. Olives. Baked potatoes with skin. Grains Wheat bran. Baked goods. Salted crackers. Cereals high in sugar. Meats and other proteins Nuts. Nut butters. Large portions of meat, poultry, or fish. Salted, precooked, or cured meats, such as sausages, meat loaves, and hot dogs. Dairy Cheese. Beverages Regular soft drinks. Regular vegetable juice. Seasonings and condiments Seasoning blends with salt. Salad dressings. Soy sauce. Ketchup. Barbecue sauce. Other foods Canned soups. Canned pasta sauce. Casseroles. Pizza. Lasagna. Frozen meals. Potato chips. French fries. The items listed above may not be a complete list of foods and beverages you should limit. Contact a dietitian for more information. What foods should I avoid? Talk to your dietitian about  specific foods you should avoid based on the type of kidney stones you have and your overall health. Fruits Grapefruit. The item listed above may not be a complete list of foods and beverages you should avoid. Contact a dietitian for more information. Summary  Kidney stones are deposits of minerals and salts that form inside your kidneys.  You can lower your risk of kidney stones by making changes to your diet.  The most important thing you can do is drink enough fluid. Drink enough fluid to keep your urine pale yellow.  Talk to your dietitian about how much calcium you should have each day, and eat less salt and animal protein as told by your dietitian. This information is not intended to replace advice given to you by your health care provider. Make sure you discuss any questions you have with your health care provider. Document Revised: 12/26/2018 Document Reviewed: 12/26/2018 [employer] Patient Education  2021 [employer]  

## 2020-03-24 NOTE — Progress Notes (Signed)
Urological Symptom Review  Patient is experiencing the following symptoms: Burning/pain with urination Get up at night to urinate Kidney stones  Review of Systems  Gastrointestinal (upper)  : Negative for upper GI symptoms  Gastrointestinal (lower) : Negative for lower GI symptoms  Constitutional : Fever Weight loss  Skin: Negative for skin symptoms  Eyes: Blurred vision  Ear/Nose/Throat : Negative for Ear/Nose/Throat symptoms  Hematologic/Lymphatic: Negative for Hematologic/Lymphatic symptoms  Cardiovascular : Chest pain  Respiratory : Shortness of breath  Endocrine: Negative for endocrine symptoms  Musculoskeletal: Negative for musculoskeletal symptoms  Neurological: Dizziness  Psychologic: Negative for psychiatric symptoms

## 2020-03-24 NOTE — Discharge Instructions (Signed)
Continue taking your Vicodin for pain and your Zofran for nausea.  You can increase your Protonix so you are taking it twice a day and follow-up with your family doctor next week

## 2020-03-24 NOTE — ED Triage Notes (Signed)
Pt to er room number 11 via ems, per ems pt is here for abd pain, pt has 20 gauge iv in her R AC placed by ems.  Pt states that she was dx with a large kidney stone in the past, states that she went to see a kidney doctor today and he sent her for an x ray and ct.  Pt states that she is here for some abd pain, pt c/o generalized abd pain.  States that when she doesn't eat her pain is worse.

## 2020-03-24 NOTE — ED Provider Notes (Signed)
[facility] EMERGENCY DEPARTMENT Provider Note   CSN: [id number] Arrival date & time: 03/24/20  2106     History Chief Complaint  Patient presents with  . Abdominal Pain    [name] is a 87 y.o. female.  Patient complains of lower abdominal pain.  Patient has chronic lower abdominal pain and recently had a kidney stone.  She was seen by urology today and had CT of the abdomen done which showed that she passed a kidney stone.  A note from her primary care doctor states that she has had this abdominal pain worked up completely.  The history is provided by the patient and medical records. No language interpreter was used.  Abdominal Pain Pain location:  Generalized Pain quality: aching   Pain radiates to:  Does not radiate Pain severity:  Moderate Onset quality:  Sudden Timing:  Constant Progression:  Waxing and waning Chronicity:  Recurrent Context: not alcohol use   Relieved by:  Nothing Associated symptoms: no chest pain, no cough, no diarrhea, no fatigue and no hematuria        Past Medical History:  Diagnosis Date  . AICD (automatic cardioverter/defibrillator) present   . Allergy    SESONAL  . Anxiety   . ARTHRITIS   . Arthritis   . Atrial fibrillation ([place])   . CAD (coronary artery disease)    a. history of cardiac arrest 1993;  b. s/p LAD/LCX stenting in 2003;  c. 07/2011 Cath: LM nl, LAD patent stent, D1 80ost, LCX patent stent, RCA min irregs;  d. 04/2012 MV: EF 66%, no ishcemia;  e. 12/2013 Echo: Ef 55%, no rwma, Gr 1 DD, triv AI/MR, mildly dil LA;  f. 12/2013 Lexi MV: intermediate risk - apical ischemia and inf/infsept fixed defect, ? artifactual.  . CAD in native artery 12/31/2013   Previous stents to LAD and Ramus patent on cath today 12/31/13 also There is severe disease in the ostial first diagonal which is unchanged from most recent cardiac catheterization. The right coronary artery could not be engaged selectively but nonselective angiography showed  no significant disease in the proximal and midsegment.     . Cataract    DENIES  . Chronic back pain   . Chronic sinus bradycardia   . CKD (chronic kidney disease), stage III ([place])   . Clotting disorder (HCC)    DVT  . COLITIS 12/02/2007   Qualifier: Diagnosis of  By: Nils Pyle CMA ([place]), [name]    . Diabetes mellitus without complication ([place])    DENIES  . Diverticulosis of colon (without mention of hemorrhage) 2007   Colonoscopy   . Esophageal stricture    a. 2012 s/p dil.  . Esophagitis, unspecified    a. 2012 EGD  . EXTERNAL HEMORRHOIDS   . GERD (gastroesophageal reflux disease)    omeprazole  . H/O hiatal hernia   . Hiatal hernia    a. 2012 EGD.  Marland Kitchen History of DVT in the past, not on Coumadin now    left  leg  . HYPERCHOLESTEROLEMIA   . Hypertension   . ICD (implantable cardiac defibrillator) in place    a. s/p initial ICD in 1993 in setting of cardiac arrest;  b. 01/2007 gen change: Guidant T135 Vitality DS VR single lead ICD.  . Macular degeneration    gets injecton in eye every 5 weeks- last injection - 05/03/2013   . Myocardial infarction ([place]) 1993  . Near syncope 05/22/2015  . Nephrolithiasis, just saw Dr [name]   .  Orthostatic hypotension   . Peripheral vascular disease ([place])    ???  . RECTAL BLEEDING 12/03/2007   Qualifier: Diagnosis of  By: Sharlett Iles MD Byrd Hesselbach   . Unspecified gastritis and gastroduodenitis without mention of hemorrhage    a. 2003 EGD->not noted on 2012 EGD.  Marland Kitchen Unstable angina, neg MI, cath stable.maybe GI 12/30/2013    Patient Active Problem List   Diagnosis Date Noted  . Malnutrition of moderate degree 12/26/2019  . Prolonged QT interval 12/23/2019  . Acute lower UTI 12/23/2019  . Constipation 11/27/2019  . Diarrhea 11/27/2019  . Lesion of pancreas 11/26/2019  . Fibromuscular dysplasia of right renal artery ([place]) 11/26/2019  . Left renal artery stenosis ([place]) 11/26/2019  . RUQ pain   . Abnormal CT of the abdomen   .  Vertigo 11/15/2019  . Advanced care planning/counseling discussion 11/15/2019  . CAD (coronary artery disease)   . Hypothyroidism   . Hyponatremia   . Volume depletion   . TSH elevation 08/01/2019  . Hospital discharge follow-up 08/01/2019  . Exudative age-related macular degeneration of right eye with active choroidal neovascularization ([place]) 04/30/2019  . Retinal hemorrhage of right eye 04/30/2019  . Advanced nonexudative age-related macular degeneration of left eye with subfoveal involvement 04/30/2019  . Exudative age-related macular degeneration of left eye with inactive choroidal neovascularization ([place]) 04/30/2019  . Advanced nonexudative age-related macular degeneration of right eye without subfoveal involvement 04/30/2019  . Posterior vitreous detachment of both eyes 04/30/2019  . Secondary hypercoagulable state ([place]) 03/11/2019  . Persistent atrial fibrillation ([place])   . Proctitis 01/09/2019  . Atrial fibrillation, chronic ([place]) 01/09/2019  . Hyperlipidemia 01/09/2019  . Nausea and vomiting 01/09/2019  . Hyperglycemia 01/09/2019  . COVID-19 virus infection 12/28/2018  . Chronic diarrhea 09/18/2018  . Abdominal pain 09/18/2018  . Osteoarthritis of right knee 03/29/2018  . GERD (gastroesophageal reflux disease)   . Diarrhea of presumed infectious origin 02/06/2018  . Acute kidney injury superimposed on CKD ([place]) 02/05/2018  . Essential hypertension 12/21/2017  . Degenerative scoliosis 02/19/2017  . Burst fracture of lumbar vertebra ([place]) 02/19/2017  . History of drug-induced prolonged QT interval with torsade de pointes 11/01/2016  . Iron deficiency anemia 10/30/2016  . Long term current use of anticoagulant 02/17/2016  . Near syncope 05/22/2015  . Diabetes mellitus type 2, diet-controlled ([place]) 09/14/2014  . ICD (implantable cardioverter-defibrillator) battery depletion 01/20/2014  . Abnormal nuclear stress test, 12/30/13 12/31/2013  . Coronary artery disease involving  native coronary artery of native heart without angina pectoris 12/31/2013  . Paroxysmal atrial fibrillation, chads2 Vasc2 score of 5, on eliquis 10/16/2013  . Catecholaminergic polymorphic ventricular tachycardia ([place]) 10/16/2013  . Ureteral stone with hydronephrosis 05/20/2013  . Metabolic syndrome [id number]  . Orthostatic hypotension 07/27/2011  . Chest pain 07/25/2011  . Weakness 07/25/2011    Class: Acute  . Chronic sinus bradycardia 07/25/2011  . ICD (implantable cardioverter-defibrillator) in place 07/25/2011  . CKD (chronic kidney disease) stage 3, GFR 30-59 ml/min (HCC) 07/25/2011    Class: Chronic  . History of DVT in the past, on eliquis now 07/25/2011    Class: History of  . Nephrolithiasis, just saw Dr [name] 07/25/2011    Class: History of  . DYSPHAGIA 03/24/2010  . Chronic ischemic heart disease 12/03/2007  . Irritable bowel syndrome with diarrhea 12/03/2007  . EPIGASTRIC PAIN 12/03/2007  . Hyperlipidemia associated with type 2 diabetes mellitus ([place]) 12/02/2007    Class: History of  . EXTERNAL HEMORRHOIDS 12/02/2007  Class: History of  . ESOPHAGITIS 12/02/2007  . GASTRITIS 12/02/2007  . DIVERTICULOSIS, COLON 12/02/2007  . ARTHRITIS 12/02/2007    Class: Chronic    Past Surgical History:  Procedure Laterality Date  . BREAST BIOPSY Left 11/2013  . CARDIAC CATHETERIZATION    . CARDIAC CATHETERIZATION  01/01/15   patent stents -there is severe disease in ostial 1st diag but without change.   Marland Kitchen CARDIAC DEFIBRILLATOR PLACEMENT  02/05/2007   Guidant  . CARDIOVASCULAR STRESS TEST  12/30/2013   abnormal  . CARDIOVERSION N/A 02/13/2019   Procedure: CARDIOVERSION;  Surgeon: Sanda Klein, MD;  Location: [place];  Service: Cardiovascular;  Laterality: N/A;  . CATARACT EXTRACTION Right    Dr. Katy Fitch  . COLONOSCOPY    . coronary stents     . CYSTOSCOPY WITH URETEROSCOPY Right 05/20/2013   Procedure: CYSTOSCOPY, RIGHT URETEROSCOPY STONE EXTRACTION, Insertion  of right DOUBLE J STENT ;  Surgeon: Irine Seal, MD;  Location: [facility];  Service: Urology;  Laterality: Right;  . CYSTOSCOPY WITH URETEROSCOPY AND STENT PLACEMENT N/A 06/03/2013   Procedure: SECOND LOOK CYSTOSCOPY WITH URETEROSCOPY  HOLMIUM LASER LITHO AND STONE EXTRACTION Sammie Bench ;  Surgeon: Malka So, MD;  Location: [facility];  Service: Urology;  Laterality: N/A;  . ESOPHAGOGASTRODUODENOSCOPY (EGD) WITH PROPOFOL N/A 11/20/2019   Procedure: ESOPHAGOGASTRODUODENOSCOPY (EGD) WITH PROPOFOL;  Surgeon: Daneil Dolin, MD;  [facility];  Service: Endoscopy;  Laterality: N/A;  . GIVENS CAPSULE STUDY N/A 10/30/2016   Procedure: GIVENS CAPSULE STUDY;  Surgeon: Irene Shipper, MD;  Location: [place];  Service: Endoscopy;  Laterality: N/A;  NEEDS TO BE ADMITTED FOR OBSERVATION PT. HAS DEFIB  . HEMORROIDECTOMY  80's  . HOLMIUM LASER APPLICATION Right [id number]   Procedure: HOLMIUM LASER APPLICATION;  Surgeon: Irine Seal, MD;  Location: [facility];  Service: Urology;  Laterality: Right;  . HYSTEROSCOPY WITH D & C  09/02/2010   Procedure: DILATATION AND CURETTAGE (D&C) /HYSTEROSCOPY;  Surgeon: Margarette Asal;  Location: [facility];  Service: Gynecology;  Laterality: N/A;  Dilation and Curettage with Hysteroscopy and Polypectomy  . IMPLANTABLE CARDIOVERTER DEFIBRILLATOR (ICD) GENERATOR CHANGE N/A 02/10/2014   Procedure: ICD GENERATOR CHANGE;  Surgeon: Sanda Klein, MD;  Location: [place];  Service: Cardiovascular;  Laterality: N/A;  . LEFT HEART CATHETERIZATION WITH CORONARY ANGIOGRAM N/A 07/28/2011   Procedure: LEFT HEART CATHETERIZATION WITH CORONARY ANGIOGRAM;  Surgeon: Lorretta Harp, MD;  Location: [facility];  Service: Cardiovascular;  Laterality: N/A;  . LEFT HEART CATHETERIZATION WITH CORONARY ANGIOGRAM N/A 12/31/2013   Procedure: LEFT HEART CATHETERIZATION WITH CORONARY ANGIOGRAM;  Surgeon: Wellington Hampshire, MD;  Location: [place] CATH LAB;  Service: Cardiovascular;  Laterality: N/A;     OB  History   No obstetric history on file.     Family History  Problem Relation Age of Onset  . Stroke Mother   . Other Mother        brain tumor  . Other Father        MI  . Heart attack Father   . Stroke Sister   . Macular degeneration Sister   . Diabetes Daughter   . Cancer Daughter        ovarian  . Colon polyps Daughter   . Atrial fibrillation Sister   . Hyperlipidemia Sister   . Osteoporosis Sister   . Stroke Sister   . Uterine cancer Sister   . Heart attack Brother   . Heart disease Brother   .  Asthma Brother   . Breast cancer Neg Hx     Social History   Tobacco Use  . Smoking status: Never Smoker  . Smokeless tobacco: Never Used  Vaping Use  . Vaping Use: Never used  Substance Use Topics  . Alcohol use: No  . Drug use: No    Home Medications Prior to Admission medications   Medication Sig Start Date End Date Taking? Authorizing Provider  acetaminophen (TYLENOL) 500 MG tablet Take 500 mg by mouth every 6 (six) hours as needed.    [provider]  amiodarone (PACERONE) 200 MG tablet TAKE 1 TABLET DAILY 03/15/20   Croitoru, Dani Gobble, MD  apixaban (ELIQUIS) 2.5 MG TABS tablet Take 1 tablet (2.5 mg total) by mouth 2 (two) times daily. 02/11/20   Croitoru, Mihai, MD  carboxymethylcellulose (REFRESH PLUS) 0.5 % SOLN Place 1 drop into both eyes 3 (three) times daily as needed (dry/irritated eyes.).    [provider]  Cholecalciferol (VITAMIN D) 50 MCG (2000 UT) CAPS Take 2,000 Units by mouth daily.     [provider]  diphenoxylate-atropine (LOMOTIL) 2.5-0.025 MG tablet TAKE 1 TABLET EVERY 8 HOURS AS NEEDED FOR DIARRHEA 03/25/19   Ronnie Doss M, DO  feeding supplement (ENSURE ENLIVE / ENSURE PLUS) LIQD Take 237 mLs by mouth 2 (two) times daily between meals. 12/26/19   Mikhail, Velta Addison, DO  hydrALAZINE (APRESOLINE) 25 MG tablet Take 25 mg by mouth 3 (three) times daily. 01/16/20   [provider]  HYDROcodone-acetaminophen (NORCO)  5-325 MG tablet Take 1 tablet by mouth every 4 (four) hours as needed for moderate pain. 03/10/20   McKenzie, Candee Furbish, MD  isosorbide mononitrate (IMDUR) 30 MG 24 hr tablet TAKE ONE HALF (1/2) TABLET DAILY Patient taking differently: Take 15 mg by mouth daily. 09/23/19   Croitoru, Mihai, MD  levothyroxine (SYNTHROID) 100 MCG tablet Take 1 tablet (100 mcg total) by mouth daily. 01/27/20   Croitoru, Mihai, MD  loratadine (CLARITIN) 10 MG tablet Take 1 tablet (10 mg total) by mouth daily. (for itching) 01/20/19   [name], DO  meclizine (ANTIVERT) 25 MG tablet Take 25 mg by mouth 3 (three) times daily as needed for dizziness or nausea. 07/22/19   [provider]  metoprolol tartrate (LOPRESSOR) 25 MG tablet Take by mouth. 03/15/20   [provider]  Multiple Vitamins-Minerals (CENTRUM SILVER ULTRA WOMENS PO) Take 1 tablet by mouth daily.    [provider]  nitroGLYCERIN (NITROSTAT) 0.4 MG SL tablet Place 1 tablet (0.4 mg total) under the tongue every 5 (five) minutes as needed for chest pain. 12/20/18   Claretta Fraise, MD  ondansetron (ZOFRAN) 4 MG tablet Take 1 tablet (4 mg total) by mouth every 6 (six) hours as needed for nausea or vomiting. [id number]   Delora Fuel, MD  pantoprazole (PROTONIX) 40 MG tablet TAKE 1 TABLET ONCE DAILY 03/15/20   Ronnie Doss M, DO  PARoxetine (PAXIL) 20 MG tablet TAKE (1) TABLET DAILY IN THE MORNING. 03/15/20   Ronnie Doss M, DO  rosuvastatin (CRESTOR) 10 MG tablet Take 1 tablet (10 mg total) by mouth daily. 02/24/20   [name], DO  tamsulosin (FLOMAX) 0.4 MG CAPS capsule Take 1 capsule (0.4 mg total) by mouth daily. 03/10/20   McKenzie, Candee Furbish, MD    Allergies    Sotalol, Bactrim [sulfamethoxazole-trimethoprim], Ciprofloxacin, Tramadol, Actos [pioglitazone], Adhesive [tape], Atorvastatin, Contrast media [iodinated diagnostic agents], Latex, Pedi-pre tape spray [wound dressing adhesive], and Sulfa antibiotics  Review of  Systems   Review of Systems  Constitutional: Negative for appetite change and fatigue.  HENT: Negative for congestion, ear discharge and sinus pressure.   Eyes: Negative for discharge.  Respiratory: Negative for cough.   Cardiovascular: Negative for chest pain.  Gastrointestinal: Positive for abdominal pain. Negative for diarrhea.  Genitourinary: Negative for frequency and hematuria.  Musculoskeletal: Negative for back pain.  Skin: Negative for rash.  Neurological: Negative for seizures and headaches.  Psychiatric/Behavioral: Negative for hallucinations.    Physical Exam Updated Vital Signs BP (!) 142/65 (BP Location: Left Arm)   Pulse 74   Temp 98 F (36.7 C) (Oral)   Resp 16   Ht '5\' 3"'$  (1.6 m)   Wt 61.2 kg   SpO2 98%   BMI 23.91 kg/m   Physical Exam Vitals and nursing note reviewed.  Constitutional:      Appearance: She is well-developed.  HENT:     Head: Normocephalic.     Nose: Nose normal.  Eyes:     General: No scleral icterus.    Conjunctiva/sclera: Conjunctivae normal.  Neck:     Thyroid: No thyromegaly.  Cardiovascular:     Rate and Rhythm: Normal rate and regular rhythm.     Heart sounds: No murmur heard. No friction rub. No gallop.   Pulmonary:     Breath sounds: No stridor. No wheezing or rales.  Chest:     Chest wall: No tenderness.  Abdominal:     General: There is no distension.     Tenderness: There is no rebound.     Comments: Minimal tenderness throughout abdomen  Musculoskeletal:        General: Normal range of motion.     Cervical back: Neck supple.  Lymphadenopathy:     Cervical: No cervical adenopathy.  Skin:    Findings: No erythema or rash.  Neurological:     Mental Status: She is alert and oriented to person, place, and time.     Motor: No abnormal muscle tone.     Coordination: Coordination normal.  Psychiatric:        Behavior: Behavior normal.     ED Results / Procedures / Treatments   Labs (all labs ordered are listed,  but only abnormal results are displayed) Labs Reviewed  CBC WITH DIFFERENTIAL/PLATELET - Abnormal; Notable for the following components:      Result Value   RBC 3.71 (*)    Hemoglobin 11.3 (*)    HCT 34.3 (*)    All other components within normal limits  COMPREHENSIVE METABOLIC PANEL - Abnormal; Notable for the following components:   Sodium 131 (*)    Glucose, Bld 117 (*)    Creatinine, Ser 1.05 (*)    Albumin 3.1 (*)    AST 45 (*)    ALT 45 (*)    GFR, Estimated 52 (*)    All other components within normal limits  LIPASE, BLOOD    EKG None  Radiology Abdomen 1 view (KUB)  Result Date: 03/24/2020 CLINICAL DATA:  History of right-sided kidney stone. EXAM: ABDOMEN - 1 VIEW COMPARISON:  Radiographs 03/10/2020.  CT today and 02/27/2020. FINDINGS: Bilateral renal calculi are visible. No suspicious calcifications are seen along the course of the ureters as correlated with today CT. The bowel gas pattern is nonobstructive. AICD and epicardial pacing leads are present. There are degenerative changes throughout the spine associated with a convex left scoliosis. IMPRESSION: Bilateral renal calculi. No acute findings identified. Electronically Signed  By: Richardean Sale M.D.   On: 03/24/2020 16:51   CT RENAL STONE STUDY  Result Date: 03/24/2020 CLINICAL DATA:  Bilateral flank pain with nausea. Diagnosed with kidney stones 3 weeks ago. EXAM: CT ABDOMEN AND PELVIS WITHOUT CONTRAST TECHNIQUE: Multidetector CT imaging of the abdomen and pelvis was performed following the standard protocol without IV contrast. COMPARISON:  CT 02/27/2020. FINDINGS: Lower chest: Emphysematous changes and subpleural reticulation at both lung bases. A right lower lobe nodule measuring 6 mm on image 34/4 is similar to prior studies dating back to 05/11/2017 and earlier, consistent with a benign etiology. Cardiac AICD leads noted. Hepatobiliary: The liver appears somewhat dense without focal abnormality. No evidence of  gallstones, gallbladder wall thickening or biliary dilatation. Pancreas: 7 mm low-density lesion in the pancreatic neck on image 26/2 is unchanged. This appears cystic on prior studies dating back to 01/08/2019 and is likely a benign finding. No pancreatic ductal dilatation or surrounding inflammation. Spleen: Normal in size without focal abnormality. Adrenals/Urinary Tract: Both adrenal glands appear normal. Extensive bilateral nephrocalcinosis with multiple caliceal calculi bilaterally. The previously demonstrated right-sided hydronephrosis and mid right ureteral calculus are no longer present. Stable chronic left renal atrophy. No perinephric soft tissue stranding. The bladder appears unremarkable. Stomach/Bowel: The stomach appears unremarkable for its degree of distension. No evidence of bowel wall thickening, distention or surrounding inflammatory change. The appendix appears normal. Vascular/Lymphatic: There are no enlarged abdominal or pelvic lymph nodes. Diffuse aortic and branch vessel atherosclerosis without evidence of aneurysm. Reproductive: Uterus and ovaries appear normal.  No adnexal mass. Other: No evidence of abdominal wall mass or hernia. No ascites. Multiple epicardial leads within the anterior abdominal wall. Musculoskeletal: No acute or significant osseous findings. Multilevel spondylosis associated with a convex left scoliosis. Stable chronic T12 and L1 superior endplate compression deformities. IMPRESSION: 1. Interval passage of previously demonstrated mid right ureteral calculus with resolved hydronephrosis. Extensive bilateral nephrocalcinosis with multiple caliceal calculi bilaterally. 2. No acute findings or explanation for the patient's symptoms. 3. Stable chronic left renal atrophy. 4. Aortic Atherosclerosis (ICD10-I70.0) and Emphysema (ICD10-J43.9). Electronically Signed   By: Richardean Sale M.D.   On: 03/24/2020 16:48    Procedures Procedures   Medications Ordered in  ED Medications  morphine 2 MG/ML injection 2 mg (2 mg Intravenous Given 03/24/20 2154)  HYDROcodone-acetaminophen (NORCO/VICODIN) 5-325 MG per tablet 1 tablet (1 tablet Oral Given 03/24/20 2243)    ED Course  I have reviewed the triage vital signs and the nursing notes.  Pertinent labs & imaging results that were available during my care of the patient were reviewed by me and considered in my medical decision making (see chart for details).    MDM Rules/Calculators/A&P                          Patient with chronic abdominal pain.  Labs unremarkable.  She will be discharged home and will continue taking her Vicodin Zofran and we will increase her Protonix to twice a day and she will follow up with her PCP Final Clinical Impression(s) / ED Diagnoses Final diagnoses:  Lower abdominal pain    Rx / DC Orders ED Discharge Orders    None       Milton Ferguson, MD 03/25/20 1010

## 2020-03-25 ENCOUNTER — Ambulatory Visit: Payer: Medicare Other | Admitting: "Endocrinology

## 2020-03-25 ENCOUNTER — Telehealth: Payer: Self-pay | Admitting: Internal Medicine

## 2020-03-25 NOTE — Telephone Encounter (Signed)
Dr. Abbey Chatters  Pt's daughter wants to speak with you

## 2020-03-25 NOTE — Telephone Encounter (Signed)
Pt said Dr Abbey Chatters called and spoke to her on Tuesday and she would like for him to call her back at [phone] or [phone] (daughter, Jeanett's number)

## 2020-03-27 ENCOUNTER — Other Ambulatory Visit: Payer: Self-pay

## 2020-03-27 ENCOUNTER — Encounter (HOSPITAL_COMMUNITY): Payer: Self-pay

## 2020-03-27 ENCOUNTER — Other Ambulatory Visit: Payer: Self-pay | Admitting: Family Medicine

## 2020-03-27 ENCOUNTER — Inpatient Hospital Stay (HOSPITAL_COMMUNITY)
Admission: EM | Admit: 2020-03-27 | Discharge: 2020-04-06 | DRG: 394 | Disposition: A | Discharge status: Skilled Nursing Facility | Payer: Medicare Other | Attending: Family Medicine | Admitting: Family Medicine

## 2020-03-27 DIAGNOSIS — D631 Anemia in chronic kidney disease: Secondary | ICD-10-CM | POA: Diagnosis present

## 2020-03-27 DIAGNOSIS — Z888 Allergy status to other drugs, medicaments and biological substances status: Secondary | ICD-10-CM

## 2020-03-27 DIAGNOSIS — Z882 Allergy status to sulfonamides status: Secondary | ICD-10-CM

## 2020-03-27 DIAGNOSIS — Z8616 Personal history of COVID-19: Secondary | ICD-10-CM

## 2020-03-27 DIAGNOSIS — Z955 Presence of coronary angioplasty implant and graft: Secondary | ICD-10-CM

## 2020-03-27 DIAGNOSIS — N1831 Chronic kidney disease, stage 3a: Secondary | ICD-10-CM | POA: Diagnosis present

## 2020-03-27 DIAGNOSIS — R109 Unspecified abdominal pain: Secondary | ICD-10-CM | POA: Diagnosis not present

## 2020-03-27 DIAGNOSIS — Z8049 Family history of malignant neoplasm of other genital organs: Secondary | ICD-10-CM

## 2020-03-27 DIAGNOSIS — Z833 Family history of diabetes mellitus: Secondary | ICD-10-CM

## 2020-03-27 DIAGNOSIS — E1122 Type 2 diabetes mellitus with diabetic chronic kidney disease: Secondary | ICD-10-CM | POA: Diagnosis present

## 2020-03-27 DIAGNOSIS — I482 Chronic atrial fibrillation, unspecified: Secondary | ICD-10-CM | POA: Diagnosis present

## 2020-03-27 DIAGNOSIS — I129 Hypertensive chronic kidney disease with stage 1 through stage 4 chronic kidney disease, or unspecified chronic kidney disease: Secondary | ICD-10-CM | POA: Diagnosis present

## 2020-03-27 DIAGNOSIS — Z8674 Personal history of sudden cardiac arrest: Secondary | ICD-10-CM

## 2020-03-27 DIAGNOSIS — M1990 Unspecified osteoarthritis, unspecified site: Secondary | ICD-10-CM | POA: Diagnosis present

## 2020-03-27 DIAGNOSIS — G8929 Other chronic pain: Secondary | ICD-10-CM | POA: Diagnosis present

## 2020-03-27 DIAGNOSIS — I447 Left bundle-branch block, unspecified: Secondary | ICD-10-CM | POA: Diagnosis present

## 2020-03-27 DIAGNOSIS — R0902 Hypoxemia: Secondary | ICD-10-CM | POA: Diagnosis present

## 2020-03-27 DIAGNOSIS — Z91040 Latex allergy status: Secondary | ICD-10-CM

## 2020-03-27 DIAGNOSIS — Z20822 Contact with and (suspected) exposure to covid-19: Secondary | ICD-10-CM | POA: Diagnosis present

## 2020-03-27 DIAGNOSIS — Z79899 Other long term (current) drug therapy: Secondary | ICD-10-CM

## 2020-03-27 DIAGNOSIS — E440 Moderate protein-calorie malnutrition: Secondary | ICD-10-CM

## 2020-03-27 DIAGNOSIS — E860 Dehydration: Secondary | ICD-10-CM

## 2020-03-27 DIAGNOSIS — K449 Diaphragmatic hernia without obstruction or gangrene: Secondary | ICD-10-CM | POA: Diagnosis present

## 2020-03-27 DIAGNOSIS — Z79890 Hormone replacement therapy (postmenopausal): Secondary | ICD-10-CM

## 2020-03-27 DIAGNOSIS — E2740 Unspecified adrenocortical insufficiency: Secondary | ICD-10-CM

## 2020-03-27 DIAGNOSIS — Z823 Family history of stroke: Secondary | ICD-10-CM

## 2020-03-27 DIAGNOSIS — Z91041 Radiographic dye allergy status: Secondary | ICD-10-CM

## 2020-03-27 DIAGNOSIS — R778 Other specified abnormalities of plasma proteins: Secondary | ICD-10-CM

## 2020-03-27 DIAGNOSIS — Z95810 Presence of automatic (implantable) cardiac defibrillator: Secondary | ICD-10-CM

## 2020-03-27 DIAGNOSIS — I10 Essential (primary) hypertension: Secondary | ICD-10-CM | POA: Diagnosis present

## 2020-03-27 DIAGNOSIS — Z8371 Family history of colonic polyps: Secondary | ICD-10-CM

## 2020-03-27 DIAGNOSIS — K5730 Diverticulosis of large intestine without perforation or abscess without bleeding: Secondary | ICD-10-CM | POA: Diagnosis present

## 2020-03-27 DIAGNOSIS — L299 Pruritus, unspecified: Secondary | ICD-10-CM

## 2020-03-27 DIAGNOSIS — E1151 Type 2 diabetes mellitus with diabetic peripheral angiopathy without gangrene: Secondary | ICD-10-CM | POA: Diagnosis present

## 2020-03-27 DIAGNOSIS — R110 Nausea: Secondary | ICD-10-CM

## 2020-03-27 DIAGNOSIS — R9431 Abnormal electrocardiogram [ECG] [EKG]: Secondary | ICD-10-CM | POA: Diagnosis present

## 2020-03-27 DIAGNOSIS — K551 Chronic vascular disorders of intestine: Principal | ICD-10-CM | POA: Diagnosis present

## 2020-03-27 DIAGNOSIS — K219 Gastro-esophageal reflux disease without esophagitis: Secondary | ICD-10-CM | POA: Diagnosis present

## 2020-03-27 DIAGNOSIS — I2510 Atherosclerotic heart disease of native coronary artery without angina pectoris: Secondary | ICD-10-CM | POA: Diagnosis present

## 2020-03-27 DIAGNOSIS — E871 Hypo-osmolality and hyponatremia: Secondary | ICD-10-CM | POA: Diagnosis present

## 2020-03-27 DIAGNOSIS — I248 Other forms of acute ischemic heart disease: Secondary | ICD-10-CM | POA: Diagnosis present

## 2020-03-27 DIAGNOSIS — Z8249 Family history of ischemic heart disease and other diseases of the circulatory system: Secondary | ICD-10-CM

## 2020-03-27 DIAGNOSIS — E872 Acidosis, unspecified: Secondary | ICD-10-CM

## 2020-03-27 DIAGNOSIS — Z66 Do not resuscitate: Secondary | ICD-10-CM | POA: Diagnosis present

## 2020-03-27 DIAGNOSIS — E039 Hypothyroidism, unspecified: Secondary | ICD-10-CM | POA: Diagnosis present

## 2020-03-27 DIAGNOSIS — Z7901 Long term (current) use of anticoagulants: Secondary | ICD-10-CM

## 2020-03-27 DIAGNOSIS — Z825 Family history of asthma and other chronic lower respiratory diseases: Secondary | ICD-10-CM

## 2020-03-27 DIAGNOSIS — K635 Polyp of colon: Secondary | ICD-10-CM | POA: Diagnosis present

## 2020-03-27 DIAGNOSIS — Z7189 Other specified counseling: Secondary | ICD-10-CM

## 2020-03-27 DIAGNOSIS — Z881 Allergy status to other antibiotic agents status: Secondary | ICD-10-CM

## 2020-03-27 DIAGNOSIS — N183 Chronic kidney disease, stage 3 unspecified: Secondary | ICD-10-CM

## 2020-03-27 DIAGNOSIS — E7800 Pure hypercholesterolemia, unspecified: Secondary | ICD-10-CM | POA: Diagnosis present

## 2020-03-27 DIAGNOSIS — Z8262 Family history of osteoporosis: Secondary | ICD-10-CM

## 2020-03-27 DIAGNOSIS — Z83438 Family history of other disorder of lipoprotein metabolism and other lipidemia: Secondary | ICD-10-CM

## 2020-03-27 DIAGNOSIS — R7989 Other specified abnormal findings of blood chemistry: Secondary | ICD-10-CM

## 2020-03-27 DIAGNOSIS — K589 Irritable bowel syndrome without diarrhea: Secondary | ICD-10-CM | POA: Diagnosis present

## 2020-03-27 DIAGNOSIS — K2100 Gastro-esophageal reflux disease with esophagitis, without bleeding: Secondary | ICD-10-CM | POA: Diagnosis present

## 2020-03-27 DIAGNOSIS — I480 Paroxysmal atrial fibrillation: Secondary | ICD-10-CM | POA: Diagnosis present

## 2020-03-27 DIAGNOSIS — E785 Hyperlipidemia, unspecified: Secondary | ICD-10-CM | POA: Diagnosis present

## 2020-03-27 DIAGNOSIS — E876 Hypokalemia: Secondary | ICD-10-CM

## 2020-03-27 DIAGNOSIS — Z86718 Personal history of other venous thrombosis and embolism: Secondary | ICD-10-CM

## 2020-03-27 NOTE — ED Triage Notes (Signed)
Pt presents with lower abd pain that is chronic in nature. Pt seen 2 days ago for the same.

## 2020-03-28 ENCOUNTER — Inpatient Hospital Stay (HOSPITAL_COMMUNITY): Payer: Medicare Other

## 2020-03-28 ENCOUNTER — Emergency Department (HOSPITAL_COMMUNITY): Payer: Medicare Other

## 2020-03-28 DIAGNOSIS — K635 Polyp of colon: Secondary | ICD-10-CM | POA: Diagnosis present

## 2020-03-28 DIAGNOSIS — R9431 Abnormal electrocardiogram [ECG] [EKG]: Secondary | ICD-10-CM | POA: Diagnosis not present

## 2020-03-28 DIAGNOSIS — R1010 Upper abdominal pain, unspecified: Secondary | ICD-10-CM | POA: Diagnosis not present

## 2020-03-28 DIAGNOSIS — G4700 Insomnia, unspecified: Secondary | ICD-10-CM | POA: Diagnosis not present

## 2020-03-28 DIAGNOSIS — E871 Hypo-osmolality and hyponatremia: Secondary | ICD-10-CM | POA: Diagnosis present

## 2020-03-28 DIAGNOSIS — N1831 Chronic kidney disease, stage 3a: Secondary | ICD-10-CM

## 2020-03-28 DIAGNOSIS — M6281 Muscle weakness (generalized): Secondary | ICD-10-CM | POA: Diagnosis not present

## 2020-03-28 DIAGNOSIS — Z20822 Contact with and (suspected) exposure to covid-19: Secondary | ICD-10-CM | POA: Diagnosis not present

## 2020-03-28 DIAGNOSIS — E876 Hypokalemia: Secondary | ICD-10-CM

## 2020-03-28 DIAGNOSIS — D649 Anemia, unspecified: Secondary | ICD-10-CM | POA: Diagnosis not present

## 2020-03-28 DIAGNOSIS — R5381 Other malaise: Secondary | ICD-10-CM | POA: Diagnosis not present

## 2020-03-28 DIAGNOSIS — I2510 Atherosclerotic heart disease of native coronary artery without angina pectoris: Secondary | ICD-10-CM | POA: Diagnosis not present

## 2020-03-28 DIAGNOSIS — M159 Polyosteoarthritis, unspecified: Secondary | ICD-10-CM | POA: Diagnosis not present

## 2020-03-28 DIAGNOSIS — M1990 Unspecified osteoarthritis, unspecified site: Secondary | ICD-10-CM | POA: Diagnosis present

## 2020-03-28 DIAGNOSIS — R0902 Hypoxemia: Secondary | ICD-10-CM | POA: Diagnosis present

## 2020-03-28 DIAGNOSIS — E860 Dehydration: Secondary | ICD-10-CM

## 2020-03-28 DIAGNOSIS — K449 Diaphragmatic hernia without obstruction or gangrene: Secondary | ICD-10-CM | POA: Diagnosis not present

## 2020-03-28 DIAGNOSIS — E782 Mixed hyperlipidemia: Secondary | ICD-10-CM

## 2020-03-28 DIAGNOSIS — R1314 Dysphagia, pharyngoesophageal phase: Secondary | ICD-10-CM | POA: Diagnosis not present

## 2020-03-28 DIAGNOSIS — I10 Essential (primary) hypertension: Secondary | ICD-10-CM | POA: Diagnosis not present

## 2020-03-28 DIAGNOSIS — E872 Acidosis, unspecified: Secondary | ICD-10-CM

## 2020-03-28 DIAGNOSIS — K551 Chronic vascular disorders of intestine: Secondary | ICD-10-CM | POA: Diagnosis present

## 2020-03-28 DIAGNOSIS — E785 Hyperlipidemia, unspecified: Secondary | ICD-10-CM | POA: Diagnosis present

## 2020-03-28 DIAGNOSIS — I248 Other forms of acute ischemic heart disease: Secondary | ICD-10-CM | POA: Diagnosis not present

## 2020-03-28 DIAGNOSIS — R110 Nausea: Secondary | ICD-10-CM

## 2020-03-28 DIAGNOSIS — I480 Paroxysmal atrial fibrillation: Secondary | ICD-10-CM | POA: Diagnosis not present

## 2020-03-28 DIAGNOSIS — Z7901 Long term (current) use of anticoagulants: Secondary | ICD-10-CM | POA: Diagnosis not present

## 2020-03-28 DIAGNOSIS — K219 Gastro-esophageal reflux disease without esophagitis: Secondary | ICD-10-CM

## 2020-03-28 DIAGNOSIS — R109 Unspecified abdominal pain: Secondary | ICD-10-CM | POA: Diagnosis present

## 2020-03-28 DIAGNOSIS — K5730 Diverticulosis of large intestine without perforation or abscess without bleeding: Secondary | ICD-10-CM | POA: Diagnosis not present

## 2020-03-28 DIAGNOSIS — K2100 Gastro-esophageal reflux disease with esophagitis, without bleeding: Secondary | ICD-10-CM | POA: Diagnosis not present

## 2020-03-28 DIAGNOSIS — Z7401 Bed confinement status: Secondary | ICD-10-CM | POA: Diagnosis not present

## 2020-03-28 DIAGNOSIS — K580 Irritable bowel syndrome with diarrhea: Secondary | ICD-10-CM | POA: Diagnosis not present

## 2020-03-28 DIAGNOSIS — E7800 Pure hypercholesterolemia, unspecified: Secondary | ICD-10-CM | POA: Diagnosis present

## 2020-03-28 DIAGNOSIS — I129 Hypertensive chronic kidney disease with stage 1 through stage 4 chronic kidney disease, or unspecified chronic kidney disease: Secondary | ICD-10-CM | POA: Diagnosis present

## 2020-03-28 DIAGNOSIS — G8929 Other chronic pain: Secondary | ICD-10-CM | POA: Diagnosis present

## 2020-03-28 DIAGNOSIS — E119 Type 2 diabetes mellitus without complications: Secondary | ICD-10-CM | POA: Diagnosis not present

## 2020-03-28 DIAGNOSIS — E1151 Type 2 diabetes mellitus with diabetic peripheral angiopathy without gangrene: Secondary | ICD-10-CM | POA: Diagnosis present

## 2020-03-28 DIAGNOSIS — E1122 Type 2 diabetes mellitus with diabetic chronic kidney disease: Secondary | ICD-10-CM | POA: Diagnosis present

## 2020-03-28 DIAGNOSIS — R778 Other specified abnormalities of plasma proteins: Secondary | ICD-10-CM

## 2020-03-28 DIAGNOSIS — E039 Hypothyroidism, unspecified: Secondary | ICD-10-CM | POA: Diagnosis not present

## 2020-03-28 DIAGNOSIS — K6289 Other specified diseases of anus and rectum: Secondary | ICD-10-CM | POA: Diagnosis not present

## 2020-03-28 DIAGNOSIS — R262 Difficulty in walking, not elsewhere classified: Secondary | ICD-10-CM | POA: Diagnosis not present

## 2020-03-28 DIAGNOSIS — Z95810 Presence of automatic (implantable) cardiac defibrillator: Secondary | ICD-10-CM | POA: Diagnosis not present

## 2020-03-28 DIAGNOSIS — N183 Chronic kidney disease, stage 3 unspecified: Secondary | ICD-10-CM | POA: Diagnosis not present

## 2020-03-28 DIAGNOSIS — I482 Chronic atrial fibrillation, unspecified: Secondary | ICD-10-CM

## 2020-03-28 DIAGNOSIS — Z66 Do not resuscitate: Secondary | ICD-10-CM | POA: Diagnosis present

## 2020-03-28 DIAGNOSIS — R279 Unspecified lack of coordination: Secondary | ICD-10-CM | POA: Diagnosis not present

## 2020-03-28 DIAGNOSIS — E440 Moderate protein-calorie malnutrition: Secondary | ICD-10-CM | POA: Diagnosis not present

## 2020-03-28 DIAGNOSIS — F32A Depression, unspecified: Secondary | ICD-10-CM | POA: Diagnosis not present

## 2020-03-28 LAB — GLUCOSE, CAPILLARY
Glucose-Capillary: 131 mg/dL — ABNORMAL HIGH (ref 70–99)
Glucose-Capillary: 118 mg/dL — ABNORMAL HIGH (ref 70–99)
Glucose-Capillary: 144 mg/dL — ABNORMAL HIGH (ref 70–99)
Glucose-Capillary: 98 mg/dL (ref 70–99)

## 2020-03-28 LAB — CBC WITH DIFFERENTIAL/PLATELET
Abs Immature Granulocytes: 0.02 10*3/uL (ref 0.00–0.07)
Basophils Absolute: 0.1 10*3/uL (ref 0.0–0.1)
Basophils Relative: 1 %
Eosinophils Absolute: 0.3 10*3/uL (ref 0.0–0.5)
Eosinophils Relative: 6 %
HCT: 35 % — ABNORMAL LOW (ref 36.0–46.0)
Hemoglobin: 11.5 g/dL — ABNORMAL LOW (ref 12.0–15.0)
Immature Granulocytes: 0 %
Lymphocytes Relative: 30 %
Lymphs Abs: 1.5 10*3/uL (ref 0.7–4.0)
MCH: 30.4 pg (ref 26.0–34.0)
MCHC: 32.9 g/dL (ref 30.0–36.0)
MCV: 92.6 fL (ref 80.0–100.0)
Monocytes Absolute: 0.6 10*3/uL (ref 0.1–1.0)
Monocytes Relative: 12 %
Neutro Abs: 2.5 10*3/uL (ref 1.7–7.7)
Neutrophils Relative %: 51 %
Platelets: 318 10*3/uL (ref 150–400)
RBC: 3.78 MIL/uL — ABNORMAL LOW (ref 3.87–5.11)
RDW: 14 % (ref 11.5–15.5)
WBC: 5 10*3/uL (ref 4.0–10.5)
nRBC: 0 % (ref 0.0–0.2)

## 2020-03-28 LAB — COMPREHENSIVE METABOLIC PANEL
ALT: 34 U/L (ref 0–44)
AST: 38 U/L (ref 15–41)
Albumin: 3.2 g/dL — ABNORMAL LOW (ref 3.5–5.0)
Alkaline Phosphatase: 68 U/L (ref 38–126)
Anion gap: 12 (ref 5–15)
BUN: 18 mg/dL (ref 8–23)
CO2: 23 mmol/L (ref 22–32)
Calcium: 8.8 mg/dL — ABNORMAL LOW (ref 8.9–10.3)
Chloride: 97 mmol/L — ABNORMAL LOW (ref 98–111)
Creatinine, Ser: 1.17 mg/dL — ABNORMAL HIGH (ref 0.44–1.00)
GFR, Estimated: 45 mL/min — ABNORMAL LOW (ref >60)
Glucose, Bld: 98 mg/dL (ref 70–99)
Potassium: 3.2 mmol/L — ABNORMAL LOW (ref 3.5–5.1)
Sodium: 132 mmol/L — ABNORMAL LOW (ref 135–145)
Total Bilirubin: 0.7 mg/dL (ref 0.3–1.2)
Total Protein: 6.6 g/dL (ref 6.5–8.1)

## 2020-03-28 LAB — TROPONIN I (HIGH SENSITIVITY)
Troponin I (High Sensitivity): 12 ng/L (ref <18)
Troponin I (High Sensitivity): 23 ng/L — ABNORMAL HIGH (ref <18)
Troponin I (High Sensitivity): 24 ng/L — ABNORMAL HIGH (ref <18)
Troponin I (High Sensitivity): 21 ng/L — ABNORMAL HIGH (ref <18)

## 2020-03-28 LAB — LACTIC ACID, PLASMA
Lactic Acid, Venous: 1 mmol/L (ref 0.5–1.9)
Lactic Acid, Venous: 1.3 mmol/L (ref 0.5–1.9)
Lactic Acid, Venous: 2.5 mmol/L (ref 0.5–1.9)
Lactic Acid, Venous: 2.7 mmol/L (ref 0.5–1.9)

## 2020-03-28 LAB — RESP PANEL BY RT-PCR (FLU A&B, COVID) ARPGX2
Influenza A by PCR: NEGATIVE
Influenza B by PCR: NEGATIVE
SARS Coronavirus 2 by RT PCR: NEGATIVE

## 2020-03-28 LAB — LIPASE, BLOOD: Lipase: 33 U/L (ref 11–51)

## 2020-03-28 MED ORDER — FENTANYL CITRATE (PF) 100 MCG/2ML IJ SOLN
25.0000 ug | Freq: Once | INTRAMUSCULAR | Status: AC
  Administered 2020-03-28: 25 ug via INTRAVENOUS
  Filled 2020-03-28: qty 2

## 2020-03-28 MED ORDER — HYDROMORPHONE HCL 1 MG/ML IJ SOLN
1.0000 mg | INTRAMUSCULAR | Status: DC | PRN
  Administered 2020-03-28 – 2020-03-30 (×3): 1 mg via INTRAVENOUS
  Filled 2020-03-28 (×5): qty 1

## 2020-03-28 MED ORDER — HYDROMORPHONE HCL 1 MG/ML IJ SOLN
0.5000 mg | Freq: Once | INTRAMUSCULAR | Status: AC
  Administered 2020-03-28: 0.5 mg via INTRAVENOUS
  Filled 2020-03-28: qty 1

## 2020-03-28 MED ORDER — LEVOTHYROXINE SODIUM 100 MCG PO TABS
100.0000 ug | ORAL_TABLET | Freq: Every day | ORAL | Status: DC
  Administered 2020-03-28 – 2020-04-06 (×9): 100 ug via ORAL
  Filled 2020-03-28 (×9): qty 1

## 2020-03-28 MED ORDER — ROSUVASTATIN CALCIUM 10 MG PO TABS
10.0000 mg | ORAL_TABLET | Freq: Every day | ORAL | Status: DC
  Administered 2020-03-28 – 2020-04-06 (×9): 10 mg via ORAL
  Filled 2020-03-28 (×10): qty 1

## 2020-03-28 MED ORDER — HYDRALAZINE HCL 25 MG PO TABS
25.0000 mg | ORAL_TABLET | Freq: Three times a day (TID) | ORAL | Status: DC
  Administered 2020-03-28 – 2020-04-02 (×14): 25 mg via ORAL
  Filled 2020-03-28 (×16): qty 1

## 2020-03-28 MED ORDER — ISOSORBIDE MONONITRATE ER 30 MG PO TB24
15.0000 mg | ORAL_TABLET | Freq: Every day | ORAL | Status: DC
  Administered 2020-03-28 – 2020-03-31 (×3): 15 mg via ORAL
  Filled 2020-03-28 (×4): qty 1

## 2020-03-28 MED ORDER — FENTANYL CITRATE (PF) 100 MCG/2ML IJ SOLN
25.0000 ug | Freq: Once | INTRAMUSCULAR | Status: AC
Start: 2020-03-28 — End: 2020-03-28
  Administered 2020-03-28: 25 ug via INTRAVENOUS
  Filled 2020-03-28: qty 2

## 2020-03-28 MED ORDER — HYDRALAZINE HCL 20 MG/ML IJ SOLN: 10.0000 mg | Freq: Once | INTRAMUSCULAR | Status: DC | PRN

## 2020-03-28 MED ORDER — SODIUM CHLORIDE 0.9 % IV BOLUS
500.0000 mL | Freq: Once | INTRAVENOUS | Status: AC
  Administered 2020-03-28: 500 mL via INTRAVENOUS

## 2020-03-28 MED ORDER — PROCHLORPERAZINE EDISYLATE 10 MG/2ML IJ SOLN: 10.0000 mg | Freq: Four times a day (QID) | INTRAMUSCULAR | Status: DC | PRN

## 2020-03-28 MED ORDER — BUSPIRONE HCL 5 MG PO TABS
5.0000 mg | ORAL_TABLET | Freq: Three times a day (TID) | ORAL | Status: DC
  Administered 2020-03-28 – 2020-04-02 (×14): 5 mg via ORAL
  Filled 2020-03-28 (×15): qty 1

## 2020-03-28 MED ORDER — POTASSIUM CHLORIDE 10 MEQ/100ML IV SOLN
10.0000 meq | INTRAVENOUS | Status: DC
  Administered 2020-03-28 (×2): 10 meq via INTRAVENOUS
  Filled 2020-03-28 (×2): qty 100

## 2020-03-28 MED ORDER — POTASSIUM CHLORIDE CRYS ER 20 MEQ PO TBCR
40.0000 meq | EXTENDED_RELEASE_TABLET | Freq: Once | ORAL | Status: AC
  Administered 2020-03-28: 40 meq via ORAL
  Filled 2020-03-28: qty 2

## 2020-03-28 MED ORDER — ENOXAPARIN SODIUM 30 MG/0.3ML ~~LOC~~ SOLN: 30.0000 mg | SUBCUTANEOUS | Status: DC

## 2020-03-28 MED ORDER — HYDROCODONE-ACETAMINOPHEN 5-325 MG PO TABS
1.0000 | ORAL_TABLET | Freq: Four times a day (QID) | ORAL | Status: DC
  Administered 2020-03-28 – 2020-03-31 (×14): 1 via ORAL
  Filled 2020-03-28 (×15): qty 1

## 2020-03-28 MED ORDER — PANTOPRAZOLE SODIUM 40 MG IV SOLR
40.0000 mg | Freq: Once | INTRAVENOUS | Status: AC
  Administered 2020-03-28: 40 mg via INTRAVENOUS
  Filled 2020-03-28: qty 40

## 2020-03-28 MED ORDER — IOHEXOL 350 MG/ML SOLN
60.0000 mL | Freq: Once | INTRAVENOUS | Status: AC | PRN
  Administered 2020-03-28: 75 mL via INTRAVENOUS

## 2020-03-28 MED ORDER — SODIUM CHLORIDE 0.9 % IV SOLN: Freq: Once | INTRAVENOUS | Status: AC

## 2020-03-28 MED ORDER — APIXABAN 2.5 MG PO TABS
2.5000 mg | ORAL_TABLET | Freq: Two times a day (BID) | ORAL | Status: DC
  Administered 2020-03-28 – 2020-04-06 (×18): 2.5 mg via ORAL
  Filled 2020-03-28 (×19): qty 1

## 2020-03-28 MED ORDER — HYDROMORPHONE HCL 1 MG/ML IJ SOLN
0.5000 mg | INTRAMUSCULAR | Status: DC | PRN
  Administered 2020-03-28: 0.5 mg via INTRAVENOUS
  Filled 2020-03-28: qty 0.5

## 2020-03-28 NOTE — H&P (Addendum)
History and Physical  [name] [id number] DOB: Jun 03, 1931 DOA: 03/27/2020  Referring physician: Ezequiel Essex, MD PCP: [name], DO  Patient coming from: Home  Chief Complaint: Abdominal pain  HPI: [name] is a 87 y.o. female with medical history significant for atrial fibrillation on Eliquis, CAD, HTN, HLD, AICD, CKD stage III, IBS, nephrolithiasis, arthritis, history of COVID-19 in 12/2018, and GERD with esophagitis who presented with complaint of several weeks of ongoing upper abdominal pain.  Pain was in epigastric and right upper quadrant area, it was constant and wax and wane, pain worsens with food, no alleviating factor was known.  Patient was seen in the ED 2 days ago and work-up at that time was reassuring, it was thought that she must have passed a kidney stone (patient does not think so).  She endorsed having nausea without vomiting, she denies fever, chills, headache, chest pain or shortness of breath.   ED Course:  In the emergency department, was elevated at 178/83, she was initially hypoxic, but O2 eventually increased to 93-100% on room air.  Work-up in the ED showed normocytic anemia, lactic acid 2.7 > 2.5.,  Hyponatremia, hypokalemia, BUN/creatinine 18/1.17 (creatinine within baseline range).  Albumin 3.2, troponin I -12 > 23.  SARS coronavirus 2 was negative.  CT angiography of abdomen and pelvis without and with contrast showed no evidence of acute mesenteric ischemia, unchanged appearance of focal dissection of the infrarenal aorta, unchanged severe stenosis of the left internal iliac artery and unchanged stenosis of the proximal left renal artery.  She was treated with IV fentanyl, IV Dilaudid and Protonix.  IV hydration was provided.  Hospitalist was asked to admit patient for further evaluation and management.  Review of Systems:  Constitutional: Positive for appetite change.  Negative for chills and fever.  HENT: Negative for ear pain and  sore throat.   Eyes: Negative for pain and visual disturbance.  Respiratory: Negative for cough, chest tightness and shortness of breath.   Cardiovascular: Negative for chest pain and palpitations.  Gastrointestinal: Positive for abdominal pain.  Negative for vomiting.  Endocrine: Negative for polyphagia and polyuria.  Genitourinary: Negative for decreased urine volume, dysuria, enuresis Musculoskeletal: Negative for arthralgias and back pain.  Skin: Negative for color change and rash.  Allergic/Immunologic: Negative for immunocompromised state.  Neurological: Positive for weakness.  Negative for tremors, syncope, speech difficulty hematological: Does not bruise/bleed easily.  All other systems reviewed and are negative   Past Medical History:  Diagnosis Date  . AICD (automatic cardioverter/defibrillator) present   . Allergy    SESONAL  . Anxiety   . ARTHRITIS   . Arthritis   . Atrial fibrillation ([place])   . CAD (coronary artery disease)    a. history of cardiac arrest 1993;  b. s/p LAD/LCX stenting in 2003;  c. 07/2011 Cath: LM nl, LAD patent stent, D1 80ost, LCX patent stent, RCA min irregs;  d. 04/2012 MV: EF 66%, no ishcemia;  e. 12/2013 Echo: Ef 55%, no rwma, Gr 1 DD, triv AI/MR, mildly dil LA;  f. 12/2013 Lexi MV: intermediate risk - apical ischemia and inf/infsept fixed defect, ? artifactual.  . CAD in native artery 12/31/2013   Previous stents to LAD and Ramus patent on cath today 12/31/13 also There is severe disease in the ostial first diagonal which is unchanged from most recent cardiac catheterization. The right coronary artery could not be engaged selectively but nonselective angiography showed no significant disease in the proximal and midsegment.     Marland Kitchen  Cataract    DENIES  . Chronic back pain   . Chronic sinus bradycardia   . CKD (chronic kidney disease), stage III ([place])   . Clotting disorder (HCC)    DVT  . COLITIS 12/02/2007   Qualifier: Diagnosis of  By: Nils Pyle CMA  ([place]), [name]    . Diabetes mellitus without complication ([place])    DENIES  . Diverticulosis of colon (without mention of hemorrhage) 2007   Colonoscopy   . Esophageal stricture    a. 2012 s/p dil.  . Esophagitis, unspecified    a. 2012 EGD  . EXTERNAL HEMORRHOIDS   . GERD (gastroesophageal reflux disease)    omeprazole  . H/O hiatal hernia   . Hiatal hernia    a. 2012 EGD.  Marland Kitchen History of DVT in the past, not on Coumadin now    left  leg  . HYPERCHOLESTEROLEMIA   . Hypertension   . ICD (implantable cardiac defibrillator) in place    a. s/p initial ICD in 1993 in setting of cardiac arrest;  b. 01/2007 gen change: Guidant T135 Vitality DS VR single lead ICD.  . Macular degeneration    gets injecton in eye every 5 weeks- last injection - 05/03/2013   . Myocardial infarction ([place]) 1993  . Near syncope 05/22/2015  . Nephrolithiasis, just saw Dr [name]   . Orthostatic hypotension   . Peripheral vascular disease ([place])    ???  . RECTAL BLEEDING 12/03/2007   Qualifier: Diagnosis of  By: Sharlett Iles MD Byrd Hesselbach   . Unspecified gastritis and gastroduodenitis without mention of hemorrhage    a. 2003 EGD->not noted on 2012 EGD.  Marland Kitchen Unstable angina, neg MI, cath stable.maybe GI 12/30/2013   Past Surgical History:  Procedure Laterality Date  . BREAST BIOPSY Left 11/2013  . CARDIAC CATHETERIZATION    . CARDIAC CATHETERIZATION  01/01/15   patent stents -there is severe disease in ostial 1st diag but without change.   Marland Kitchen CARDIAC DEFIBRILLATOR PLACEMENT  02/05/2007   Guidant  . CARDIOVASCULAR STRESS TEST  12/30/2013   abnormal  . CARDIOVERSION N/A 02/13/2019   Procedure: CARDIOVERSION;  Surgeon: Sanda Klein, MD;  Location: [facility];  Service: Cardiovascular;  Laterality: N/A;  . CATARACT EXTRACTION Right    Dr. Katy Fitch  . COLONOSCOPY    . coronary stents     . CYSTOSCOPY WITH URETEROSCOPY Right 05/20/2013   Procedure: CYSTOSCOPY, RIGHT URETEROSCOPY STONE EXTRACTION, Insertion of  right DOUBLE J STENT ;  Surgeon: Irine Seal, MD;  Location: [facility];  Service: Urology;  Laterality: Right;  . CYSTOSCOPY WITH URETEROSCOPY AND STENT PLACEMENT N/A 06/03/2013   Procedure: SECOND LOOK CYSTOSCOPY WITH URETEROSCOPY  HOLMIUM LASER LITHO AND STONE EXTRACTION Sammie Bench ;  Surgeon: Malka So, MD;  Location: [facility];  Service: Urology;  Laterality: N/A;  . ESOPHAGOGASTRODUODENOSCOPY (EGD) WITH PROPOFOL N/A 11/20/2019   Procedure: ESOPHAGOGASTRODUODENOSCOPY (EGD) WITH PROPOFOL;  Surgeon: Daneil Dolin, MD;  [facility];  Service: Endoscopy;  Laterality: N/A;  . GIVENS CAPSULE STUDY N/A 10/30/2016   Procedure: GIVENS CAPSULE STUDY;  Surgeon: Irene Shipper, MD;  Location: [facility];  Service: Endoscopy;  Laterality: N/A;  NEEDS TO BE ADMITTED FOR OBSERVATION PT. HAS DEFIB  . HEMORROIDECTOMY  80's  . HOLMIUM LASER APPLICATION Right [id number]   Procedure: HOLMIUM LASER APPLICATION;  Surgeon: Irine Seal, MD;  Location: [facility];  Service: Urology;  Laterality: Right;  . HYSTEROSCOPY WITH D & C  09/02/2010  Procedure: DILATATION AND CURETTAGE (D&C) /HYSTEROSCOPY;  Surgeon: Margarette Asal;  Location: [facility];  Service: Gynecology;  Laterality: N/A;  Dilation and Curettage with Hysteroscopy and Polypectomy  . IMPLANTABLE CARDIOVERTER DEFIBRILLATOR (ICD) GENERATOR CHANGE N/A 02/10/2014   Procedure: ICD GENERATOR CHANGE;  Surgeon: Sanda Klein, MD;  Location: [facility];  Service: Cardiovascular;  Laterality: N/A;  . LEFT HEART CATHETERIZATION WITH CORONARY ANGIOGRAM N/A 07/28/2011   Procedure: LEFT HEART CATHETERIZATION WITH CORONARY ANGIOGRAM;  Surgeon: Lorretta Harp, MD;  Location: [facility];  Service: Cardiovascular;  Laterality: N/A;  . LEFT HEART CATHETERIZATION WITH CORONARY ANGIOGRAM N/A 12/31/2013   Procedure: LEFT HEART CATHETERIZATION WITH CORONARY ANGIOGRAM;  Surgeon: Wellington Hampshire, MD;  Location: [facility];  Service: Cardiovascular;  Laterality: N/A;    Social  History:  reports that she has never smoked. She has never used smokeless tobacco. She reports that she does not drink alcohol and does not use drugs.   Allergies  Allergen Reactions  . Sotalol Other (See Comments)    Torsades   . Bactrim [Sulfamethoxazole-Trimethoprim]     Itchy rash  . Ciprofloxacin Nausea Only  . Tramadol Other (See Comments)    Hallucination, bad-dream  . Actos [Pioglitazone] Swelling  . Adhesive [Tape] Other (See Comments)    Causes sores  . Atorvastatin Other (See Comments)    myalgia  . Contrast Media [Iodinated Diagnostic Agents] Other (See Comments)    Headache (no action/pre-med required)  . Latex Rash  . Pedi-Pre Tape Spray [Wound Dressing Adhesive] Other (See Comments)    Causes sores  . Sulfa Antibiotics Other (See Comments)    Causes sores    Family History  Problem Relation Age of Onset  . Stroke Mother   . Other Mother        brain tumor  . Other Father        MI  . Heart attack Father   . Stroke Sister   . Macular degeneration Sister   . Diabetes Daughter   . Cancer Daughter        ovarian  . Colon polyps Daughter   . Atrial fibrillation Sister   . Hyperlipidemia Sister   . Osteoporosis Sister   . Stroke Sister   . Uterine cancer Sister   . Heart attack Brother   . Heart disease Brother   . Asthma Brother   . Breast cancer Neg Hx     Prior to Admission medications   Medication Sig Start Date End Date Taking? Authorizing Provider  acetaminophen (TYLENOL) 500 MG tablet Take 500 mg by mouth every 6 (six) hours as needed.    [provider]  amiodarone (PACERONE) 200 MG tablet TAKE 1 TABLET DAILY 03/15/20   Croitoru, Dani Gobble, MD  apixaban (ELIQUIS) 2.5 MG TABS tablet Take 1 tablet (2.5 mg total) by mouth 2 (two) times daily. 02/11/20   Croitoru, Mihai, MD  carboxymethylcellulose (REFRESH PLUS) 0.5 % SOLN Place 1 drop into both eyes 3 (three) times daily as needed (dry/irritated eyes.).    [provider]   Cholecalciferol (VITAMIN D) 50 MCG (2000 UT) CAPS Take 2,000 Units by mouth daily.     [provider]  diphenoxylate-atropine (LOMOTIL) 2.5-0.025 MG tablet TAKE 1 TABLET EVERY 8 HOURS AS NEEDED FOR DIARRHEA 03/25/19   Ronnie Doss M, DO  feeding supplement (ENSURE ENLIVE / ENSURE PLUS) LIQD Take 237 mLs by mouth 2 (two) times daily between meals. 12/26/19   Mikhail, Velta Addison, DO  hydrALAZINE (APRESOLINE)  25 MG tablet Take 25 mg by mouth 3 (three) times daily. 01/16/20   [provider]  HYDROcodone-acetaminophen (NORCO) 5-325 MG tablet Take 1 tablet by mouth every 4 (four) hours as needed for moderate pain. 03/10/20   McKenzie, Candee Furbish, MD  isosorbide mononitrate (IMDUR) 30 MG 24 hr tablet TAKE ONE HALF (1/2) TABLET DAILY Patient taking differently: Take 15 mg by mouth daily. 09/23/19   Croitoru, Mihai, MD  levothyroxine (SYNTHROID) 100 MCG tablet Take 1 tablet (100 mcg total) by mouth daily. 01/27/20   Croitoru, Mihai, MD  loratadine (CLARITIN) 10 MG tablet Take 1 tablet (10 mg total) by mouth daily. (for itching) 01/20/19   [name], DO  meclizine (ANTIVERT) 25 MG tablet Take 25 mg by mouth 3 (three) times daily as needed for dizziness or nausea. 07/22/19   [provider]  metoprolol tartrate (LOPRESSOR) 25 MG tablet Take by mouth. 03/15/20   [provider]  Multiple Vitamins-Minerals (CENTRUM SILVER ULTRA WOMENS PO) Take 1 tablet by mouth daily.    [provider]  nitroGLYCERIN (NITROSTAT) 0.4 MG SL tablet Place 1 tablet (0.4 mg total) under the tongue every 5 (five) minutes as needed for chest pain. 12/20/18   Claretta Fraise, MD  ondansetron (ZOFRAN) 4 MG tablet Take 1 tablet (4 mg total) by mouth every 6 (six) hours as needed for nausea or vomiting. [id number]   Delora Fuel, MD  pantoprazole (PROTONIX) 40 MG tablet TAKE 1 TABLET ONCE DAILY 03/15/20   Ronnie Doss M, DO  PARoxetine (PAXIL) 20 MG tablet TAKE (1) TABLET DAILY IN THE MORNING.  03/15/20   Ronnie Doss M, DO  rosuvastatin (CRESTOR) 10 MG tablet Take 1 tablet (10 mg total) by mouth daily. 02/24/20   [name], DO  tamsulosin (FLOMAX) 0.4 MG CAPS capsule Take 1 capsule (0.4 mg total) by mouth daily. 03/10/20   Cleon Gustin, MD    Physical Exam: BP (!) 192/85 (BP Location: Right Arm)   Pulse 87   Temp 98.1 F (36.7 C) (Oral)   Resp 18   Ht '5\' 3"'$  (1.6 m)   Wt 58.5 kg   SpO2 99%   BMI 22.85 kg/m   . General: 87 y.o. year-old female well developed well nourished in no acute distress.  Alert and oriented x3. Marland Kitchen HEENT: Dry mucous membrane.  NCAT, EOMI . Neck: Supple, trachea medial . Cardiovascular: Regular rate and rhythm with no rubs or gallops.  No thyromegaly or JVD noted.  2/4 pulses in all 4 extremities. Marland Kitchen Respiratory: Clear to auscultation with no wheezes or rales. Good inspiratory effort. . Abdomen: Soft, tender to palpation in epigastric umbilical RUQ.  Negative CVA tenderness bilaterally.  Normal bowel sounds x4 quadrants. . Muskuloskeletal: No cyanosis, clubbing or edema noted bilaterally . Neuro: CN II-XII intact, strength 5/5 x 4, sensation, reflexes intact, no focal neurologic deficit . Skin: No ulcerative lesions noted or rashes . Psychiatry: Judgement and insight appear normal. Mood is appropriate for condition and setting          Labs on Admission:  Basic Metabolic Panel: Recent Labs  Lab 03/23/20 1046 03/24/20 2148 03/27/20 2349  NA 137 131* 132*  K 4.3 3.6 3.2*  CL 98 98 97*  CO2 '22 24 23  '$ GLUCOSE 100* 117* 98  BUN '12 18 18  '$ CREATININE 1.17* 1.05* 1.17*  CALCIUM 9.0 8.9 8.8*   Liver Function Tests: Recent Labs  Lab 03/23/20 1046 03/24/20 2148 03/27/20 2349  AST 62*  45* 38  ALT 56* 45* 34  ALKPHOS 93 69 68  BILITOT 0.5 0.7 0.7  PROT 6.4 6.5 6.6  ALBUMIN 3.4* 3.1* 3.2*   Recent Labs  Lab 03/24/20 2148 03/27/20 2349  LIPASE 33 33   No results for input(s): AMMONIA in the last 168 hours. CBC: Recent  Labs  Lab 03/24/20 2148 03/27/20 2349  WBC 4.9 5.0  NEUTROABS 2.5 2.5  HGB 11.3* 11.5*  HCT 34.3* 35.0*  MCV 92.5 92.6  PLT 327 318   Cardiac Enzymes: No results for input(s): CKTOTAL, CKMB, CKMBINDEX, TROPONINI in the last 168 hours.  BNP (last 3 results) Recent Labs    11/14/19 2157  BNP 109.0*    ProBNP (last 3 results) No results for input(s): PROBNP in the last 8760 hours.  CBG: No results for input(s): GLUCAP in the last 168 hours.  Radiological Exams on Admission: CT Angio Abd/Pel W and/or Wo Contrast  Result Date: 03/28/2020 CLINICAL DATA:  Chronic mesenteric ischemia. Right-sided abdominal pain. EXAM: CTA ABDOMEN AND PELVIS WITHOUT AND WITH CONTRAST TECHNIQUE: Multidetector CT imaging of the abdomen and pelvis was performed using the standard protocol during bolus administration of intravenous contrast. Multiplanar reconstructed images and MIPs were obtained and reviewed to evaluate the vascular anatomy. CONTRAST:  57m OMNIPAQUE IOHEXOL 350 MG/ML SOLN COMPARISON:  None. FINDINGS: VASCULAR Aorta: Aortic course and caliber are normal. There is calcific aortic atherosclerosis. Focal dissection of the infrarenal aorta is unchanged (4:81). Celiac: Patent without evidence of aneurysm, dissection, vasculitis or significant stenosis. SMA: Patent without evidence of aneurysm, dissection, vasculitis or significant stenosis. Renals: Calcification causes unchanged stenosis of the proximal left renal artery. Right renal artery is normal. IMA: Patent with mild narrowing at the origin. Inflow: Severe stenosis of the left internal iliac artery due to mixed density atherosclerosis, unchanged. Otherwise unremarkable. Proximal Outflow: Bilateral common femoral and visualized portions of the superficial and profunda femoral arteries are patent without evidence of aneurysm, dissection, vasculitis or significant stenosis. Veins: The main portal vein, superior mesenteric vein and splenic vein are  patent. The IVC is patent. Review of the MIP images confirms the above findings. NON-VASCULAR Lower chest: No acute abnormality. Hepatobiliary: No focal liver abnormality is seen. No gallstones, gallbladder wall thickening, or biliary dilatation. Pancreas: Unremarkable. No pancreatic ductal dilatation or surrounding inflammatory changes. Spleen: Normal in size without focal abnormality. Adrenals/Urinary Tract: Unchanged appearance of the kidneys with bilateral nephrocalcinosis. No hydronephrosis. Urinary bladder is distended. Stomach/Bowel: Small hiatal hernia. No bowel wall thickening. No pneumatosis. Lymphatic: No lymphadenopathy. Reproductive: Normal uterus.  No adnexal mass. Other: None Musculoskeletal: Lumbar levoscoliosis.  No acute abnormality. IMPRESSION: 1. No evidence of acute mesenteric ischemia. 2. Unchanged appearance of focal dissection of the infrarenal aorta. 3. Unchanged severe stenosis of the left internal iliac artery. 4. Unchanged stenosis of the proximal left renal artery. Aortic Atherosclerosis (ICD10-I70.0). Electronically Signed   By: KUlyses JarredM.D.   On: 03/28/2020 01:53    EKG: I independently viewed the EKG done and my findings are as followed: Normal sinus rhythm at rate of 88 bpm with prolonged QT and LBBB  Assessment/Plan Present on Admission: . Intractable abdominal pain . Hyponatremia . Prolonged QT interval . Essential hypertension . GERD (gastroesophageal reflux disease) . Hyperlipidemia . Atrial fibrillation, chronic ([place] . Hypothyroidism  Principal Problem:   Intractable abdominal pain Active Problems:   CKD (chronic kidney disease), stage III ([facility])   Essential hypertension   Dehydration   Hypokalemia   GERD (gastroesophageal reflux disease)  Atrial fibrillation, chronic ([facility])   Hyperlipidemia   Nausea   Hypothyroidism   Hyponatremia   Prolonged QT interval   Lactic acidosis   Elevated troponin I level    Intractable abdominal pain r/o  gastritis/peptic ulcer Patient complaining of abdominal pain in the epigastric, umbilical and RUQ area CT angiography of abdomen and pelvis without and with contrast showed no evidence of acute mesenteric ischemia She was treated with IV Dilaudid, IV fentanyl and IV Protonix We shall continue with IV Dilaudid at this time Protonix will be temporarily held due to prolonged QTc RUQ ultrasound will be done Consider gastroenterology consult for worsening of symptoms   GERD/nausea Protonix is temporarily held due to prolonged QTc Continue IV Compazine 10 mg every 6 hours as needed for nausea  Hyponatremia (chronic) Continue IV hydration  Hypokalemia K+ 3.2, this will be replenished  Dehydration Continue IV hydration  Lactic acidosis possibly due to dehydration Lactic acid 2.7> 2.5, continue to trend lactic acid Continue IV hydration  Elevated troponin level possibly due to type II demand ischemia Troponin 23; denies any chest pain, continue to trend troponin  Prolonged QTc (609 ms) Avoid QT prolonging drugs Magnesium level will be checked Repeat EKG in the morning  Hypothyroidism Continue Synthroid  Chronic atrial fibrillation Continue  Eliquis Amiodarone And Metoprolol Held at this time due to prolonged QTc  Patient is currently in normal sinus rhythm at a rate of 88 bpm  Chronic kidney disease stage IIIA BUN/creatinine 18/1.17 (creatinine within baseline range). Renally adjust medications, avoid nephrotoxic agents/dehydration/hypotension  Essential hypertension Continue hydralazine, Imdur  Hyperlipidemia   Continue Crestor  S/P AICD This was reviewed on 01/21/2020, battery status was good, heart rate histogram was favorable   DVT prophylaxis: Eliquis  Code Status: DNR  Family Communication: None at bedside  Disposition Plan:  Patient is from:                        home Anticipated DC to:                   SNF or family members home Anticipated DC date:                2-3 days Anticipated DC barriers:           Patient requires inpatient management due to intractable abdominal pain   Consults called: None  Admission status: Inpatient    Bernadette Hoit MD [facility]  03/28/2020, 7:51 AM

## 2020-03-28 NOTE — ED Notes (Signed)
Date and time results received: 03/28/20 0335 (use smartphrase ".now" to insert current time)  Test: lactic Critical Value: 2.5  Name of Provider Notified: rancour  Orders Received? Or Actions Taken?: acknowleged

## 2020-03-28 NOTE — TOC Initial Note (Signed)
Transition of Care [facility]) - Initial/Assessment Note    Patient Details  Name: [name] MRN: [id number] Date of Birth: May 28, 1931  Transition of Care [facility]) CM/SW Contact:    Natasha Bence, LCSW Phone Number: 03/28/2020, 3:05 PM  Clinical Narrative:                 Patient is an 87 year old female admitted for Intractable abdominal pain. CSW observed high readmission risk score and conducted readmission risk assessment. Patient will require PCP appt to be scheduled during weekday. Patient requested that CSW defer to her son for assessment questions. Patient's son Getzemani Dowis reported that patient has an aid come to the home from 10:00 am to 6:00pm and has her children provide supervision until the return of the aid. Patient's son reported that they are agreeable to [facility], but patient might be reluctant to [facility] due to not liking the last HHPT. Emmalei Ertz not able to provide name of previous [place]. Patient's son reported that SNF would have to be determined by his brother Adiya Livigni who is POA. Patient's son Tamsen Oteri reported patient needs assistance with ADL's and struggles to eat due to unsteady hand and digestive discomfort. TOC to follow.   Expected Discharge Plan: Home/Self Care Barriers to Discharge: Continued Medical Work up   Patient Goals and CMS Choice Patient states their goals for this hospitalization and ongoing recovery are:: Return home CMS Medicare.gov Compare Post Acute Care list provided to:: Patient Choice offered to / list presented to : Patient  Expected Discharge Plan and Services Expected Discharge Plan: Home/Self Care     Post Acute Care Choice: NA Living arrangements for the past 2 months: Single Family Home                 DME Arranged: N/A DME Agency: NA         HH Agency: NA        Prior Living Arrangements/Services Living arrangements for the past 2 months: [place] with:: Self,Adult Children Patient language and need for  interpreter reviewed:: Yes Do you feel safe going back to the place where you live?: Yes      Need for Family Participation in Patient Care: Yes (Comment) Care giver support system in place?: Yes (comment)   Criminal Activity/Legal Involvement Pertinent to Current Situation/Hospitalization: No - Comment as needed  Activities of Daily Living      Permission Sought/Granted Permission sought to share information with : Facility Contact Representative,Family Supports Permission granted to share information with : Yes, Verbal Permission Granted  Share Information with NAME: [place], [place] and Hilda Blades  Permission granted to share info w AGENCY: SNF or [facility]  Permission granted to share info w Relationship: Son  Permission granted to share info w Contact Information: [phone]  Emotional Assessment     Affect (typically observed): Accepting,Adaptable Orientation: : Oriented to Self,Oriented to Situation,Oriented to Place,Oriented to  Time Alcohol / Substance Use: Not Applicable Psych Involvement: No (comment)  Admission diagnosis:  Intractable abdominal pain [R10.9] Patient Active Problem List   Diagnosis Date Noted  . Intractable abdominal pain 03/28/2020  . Lactic acidosis 03/28/2020  . Elevated troponin I level 03/28/2020  . Malnutrition of moderate degree 12/26/2019  . Prolonged QT interval 12/23/2019  . Acute lower UTI 12/23/2019  . Constipation 11/27/2019  . Diarrhea 11/27/2019  . Lesion of pancreas 11/26/2019  . Fibromuscular dysplasia of right renal artery ([place]) 11/26/2019  . Left renal artery stenosis (HCC)  11/26/2019  . RUQ pain   . Abnormal CT of the abdomen   . Vertigo 11/15/2019  . Advanced care planning/counseling discussion 11/15/2019  . CAD (coronary artery disease)   . Hypothyroidism   . Hyponatremia   . Volume depletion   . TSH elevation 08/01/2019  . Hospital discharge follow-up 08/01/2019  . Exudative age-related macular degeneration of right eye with  active choroidal neovascularization ([place]) 04/30/2019  . Retinal hemorrhage of right eye 04/30/2019  . Advanced nonexudative age-related macular degeneration of left eye with subfoveal involvement 04/30/2019  . Exudative age-related macular degeneration of left eye with inactive choroidal neovascularization ([place]) 04/30/2019  . Advanced nonexudative age-related macular degeneration of right eye without subfoveal involvement 04/30/2019  . Posterior vitreous detachment of both eyes 04/30/2019  . Secondary hypercoagulable state ([place]) 03/11/2019  . Persistent atrial fibrillation ([place])   . Proctitis 01/09/2019  . Atrial fibrillation, chronic ([place]) 01/09/2019  . Hyperlipidemia 01/09/2019  . Nausea 01/09/2019  . Hyperglycemia 01/09/2019  . COVID-19 virus infection 12/28/2018  . Chronic diarrhea 09/18/2018  . Abdominal pain 09/18/2018  . Osteoarthritis of right knee 03/29/2018  . GERD (gastroesophageal reflux disease)   . Dehydration 02/06/2018  . Hypokalemia 02/06/2018  . Diarrhea of presumed infectious origin 02/06/2018  . Acute kidney injury superimposed on CKD ([place]) 02/05/2018  . Essential hypertension 12/21/2017  . Degenerative scoliosis 02/19/2017  . Burst fracture of lumbar vertebra ([place]) 02/19/2017  . History of drug-induced prolonged QT interval with torsade de pointes 11/01/2016  . Iron deficiency anemia 10/30/2016  . Long term current use of anticoagulant 02/17/2016  . Near syncope 05/22/2015  . Diabetes mellitus type 2, diet-controlled ([place]) 09/14/2014  . ICD (implantable cardioverter-defibrillator) battery depletion 01/20/2014  . Abnormal nuclear stress test, 12/30/13 12/31/2013  . Coronary artery disease involving native coronary artery of native heart without angina pectoris 12/31/2013  . Paroxysmal atrial fibrillation, chads2 Vasc2 score of 5, on eliquis 10/16/2013  . Catecholaminergic polymorphic ventricular tachycardia ([place]) 10/16/2013  . Ureteral stone with hydronephrosis  05/20/2013  . Metabolic syndrome [id number]  . Orthostatic hypotension 07/27/2011  . Chest pain 07/25/2011  . Weakness 07/25/2011    Class: Acute  . Chronic sinus bradycardia 07/25/2011  . ICD (implantable cardioverter-defibrillator) in place 07/25/2011  . CKD (chronic kidney disease), stage III ([place]) 07/25/2011    Class: Chronic  . History of DVT in the past, on eliquis now 07/25/2011    Class: History of  . Nephrolithiasis, just saw Dr [name] 07/25/2011    Class: History of  . DYSPHAGIA 03/24/2010  . Chronic ischemic heart disease 12/03/2007  . Irritable bowel syndrome with diarrhea 12/03/2007  . EPIGASTRIC PAIN 12/03/2007  . Hyperlipidemia associated with type 2 diabetes mellitus ([place]) 12/02/2007    Class: History of  . EXTERNAL HEMORRHOIDS 12/02/2007    Class: History of  . ESOPHAGITIS 12/02/2007  . GASTRITIS 12/02/2007  . DIVERTICULOSIS, COLON 12/02/2007  . ARTHRITIS 12/02/2007    Class: Chronic   PCP:  [name], DO Pharmacy:   [place] [place] [address] Phone: [phone] Fax: [phone]     Social Determinants of Health (SDOH) Interventions    Readmission Risk Interventions Readmission Risk Prevention Plan 03/28/2020 11/17/2019  Transportation Screening Complete Complete  Medication Review (RN Care Manager) Complete Complete  PCP or Specialist appointment within 3-5 days of discharge - Complete  HRI or Home Care Consult Complete Complete  SW  Recovery Care/Counseling Consult Complete Complete  Palliative Care Screening Not Applicable Not Applicable  Skilled Nursing Facility Not Applicable Not Applicable  Some recent data might be hidden

## 2020-03-28 NOTE — Consult Note (Signed)
Maylon Peppers, M.D. Gastroenterology & Hepatology                                           Patient Name: [name] Account #: _0 @   MRN: [id number] Admission Date: 03/27/2020 Date of Evaluation:  03/28/2020 Time of Evaluation: 10:57 AM  Referring Physician: Bernadette Hoit, DO  Chief Complaint: Abdominal pain  HPI:  This is a 87 y.o. female with complex history of chronic abdominal pain, atrial fibrillation, coronary artery disease status post stent in the past, CKD, diverticulosis, diabetes, previous DVT, GERD, hypertension, history of a cardiac arrest status post ICD placement, macular degeneration, peripheral vascular disease, who came to the hospital for evaluation of worsening abdominal pain.  The patient states that her epigastric abdominal pain has worsened recently and decided to come to the ER.  She has had a longstanding history of epigastric abdominal pain which has been studied in the past.  she describes the pain as a pressure that does not improve despite taking multiple medications in the past.  She reports having persistent nausea with the pain but does not vomit.  However the abdominal pain has limited her oral intake she believes that she has lost some weight due to this.  She relates she has had some dark stools recently but denies having any overt melena or rectal bleeding, no fever, chills or abdominal distention.  The patient has undergone extensive investigations in the past including an EGD performed on 11/20/2019 Which showed an non obstructive Schatzki's ring, normal stomach and duodenum.  She also had multiple imaging studies at that time including CT angio and abdomen and pelvis on 11/18/2019 which was negative for chronic mesenteric ischemia as the main mesenteric arteries were patent, there was fibromuscular dysplasia of the right renal artery and critical stenosis of the left renal artery, otherwise the rest of her intra-abdominal organs showed an 8 mm  lesion in the pancreatic head/neck which is likely an IPMN but no other alterations in the rest of her organs.  She also had an ultrasound of the liver which showed possible fatty liver but no ulcerations last colonoscopy in her records was performed in 2012 which was completely unremarkable.  Given the chronicity of her symptoms, the patient has been on Elavil and Bentyl in the past.  Due to prolonged QTC her Elavil was stopped and the patient does not remember if she had any improvement of her symptoms.  In the last few months she was on paroxetine but it is unclear if it was helping for her abdominal pain.  In the ED, the patient was found to have elevated blood pressure 178/83 but normal heart rate, she was afebrile.  Labs were remarkable for sodium 132 and potassium of 3.2, creatinine 1.17, liver function test only remarkable for AST 38, ALT 34, lipase was normal 33, white blood cell count was normal 5.0, hemoglobin 11.5 decreased, platelets 318, lactic acid was elevated 2.7 which improved with hydration.  She underwent a CT angio of her abdomen and pelvis which was negative for mesenteric ischemia, without any new changes in her vasculature compared to prior findings.  Abdominal ultrasound of the right upper quadrant did not show any acute findings.  Past Medical History: SEE CHRONIC ISSSUES: Past Medical History:  Diagnosis Date  . AICD (automatic cardioverter/defibrillator) present   . Allergy    SESONAL  .  Anxiety   . ARTHRITIS   . Arthritis   . Atrial fibrillation ([place])   . CAD (coronary artery disease)    a. history of cardiac arrest 1993;  b. s/p LAD/LCX stenting in 2003;  c. 07/2011 Cath: LM nl, LAD patent stent, D1 80ost, LCX patent stent, RCA min irregs;  d. 04/2012 MV: EF 66%, no ishcemia;  e. 12/2013 Echo: Ef 55%, no rwma, Gr 1 DD, triv AI/MR, mildly dil LA;  f. 12/2013 Lexi MV: intermediate risk - apical ischemia and inf/infsept fixed defect, ? artifactual.  . CAD in native artery  12/31/2013   Previous stents to LAD and Ramus patent on cath today 12/31/13 also There is severe disease in the ostial first diagonal which is unchanged from most recent cardiac catheterization. The right coronary artery could not be engaged selectively but nonselective angiography showed no significant disease in the proximal and midsegment.     . Cataract    DENIES  . Chronic back pain   . Chronic sinus bradycardia   . CKD (chronic kidney disease), stage III ([place])   . Clotting disorder ([facility])    DVT  . COLITIS 12/02/2007   Qualifier: Diagnosis of  By: Nils Pyle CMA ([place]), [name]    . Diabetes mellitus without complication ([place])    DENIES  . Diverticulosis of colon (without mention of hemorrhage) 2007   Colonoscopy   . Esophageal stricture    a. 2012 s/p dil.  . Esophagitis, unspecified    a. 2012 EGD  . EXTERNAL HEMORRHOIDS   . GERD (gastroesophageal reflux disease)    omeprazole  . H/O hiatal hernia   . Hiatal hernia    a. 2012 EGD.  Marland Kitchen History of DVT in the past, not on Coumadin now    left  leg  . HYPERCHOLESTEROLEMIA   . Hypertension   . ICD (implantable cardiac defibrillator) in place    a. s/p initial ICD in 1993 in setting of cardiac arrest;  b. 01/2007 gen change: Guidant T135 Vitality DS VR single lead ICD.  . Macular degeneration    gets injecton in eye every 5 weeks- last injection - 05/03/2013   . Myocardial infarction ([place]) 1993  . Near syncope 05/22/2015  . Nephrolithiasis, just saw Dr [name]   . Orthostatic hypotension   . Peripheral vascular disease ([place])    ???  . RECTAL BLEEDING 12/03/2007   Qualifier: Diagnosis of  By: Sharlett Iles MD Byrd Hesselbach   . Unspecified gastritis and gastroduodenitis without mention of hemorrhage    a. 2003 EGD->not noted on 2012 EGD.  Marland Kitchen Unstable angina, neg MI, cath stable.maybe GI 12/30/2013   Past Surgical History:  Past Surgical History:  Procedure Laterality Date  . BREAST BIOPSY Left 11/2013  . CARDIAC CATHETERIZATION     . CARDIAC CATHETERIZATION  01/01/15   patent stents -there is severe disease in ostial 1st diag but without change.   Marland Kitchen CARDIAC DEFIBRILLATOR PLACEMENT  02/05/2007   Guidant  . CARDIOVASCULAR STRESS TEST  12/30/2013   abnormal  . CARDIOVERSION N/A 02/13/2019   Procedure: CARDIOVERSION;  Surgeon: Sanda Klein, MD;  Location: [facility];  Service: Cardiovascular;  Laterality: N/A;  . CATARACT EXTRACTION Right    Dr. Katy Fitch  . COLONOSCOPY    . coronary stents     . CYSTOSCOPY WITH URETEROSCOPY Right 05/20/2013   Procedure: CYSTOSCOPY, RIGHT URETEROSCOPY STONE EXTRACTION, Insertion of right DOUBLE J STENT ;  Surgeon: Irine Seal, MD;  Location: [facility];  Service:  Urology;  Laterality: Right;  . CYSTOSCOPY WITH URETEROSCOPY AND STENT PLACEMENT N/A 06/03/2013   Procedure: SECOND LOOK CYSTOSCOPY WITH URETEROSCOPY  HOLMIUM LASER LITHO AND STONE EXTRACTION Sammie Bench ;  Surgeon: Malka So, MD;  Location: [facility];  Service: Urology;  Laterality: N/A;  . ESOPHAGOGASTRODUODENOSCOPY (EGD) WITH PROPOFOL N/A 11/20/2019   Procedure: ESOPHAGOGASTRODUODENOSCOPY (EGD) WITH PROPOFOL;  Surgeon: Daneil Dolin, MD;  [facility];  Service: Endoscopy;  Laterality: N/A;  . GIVENS CAPSULE STUDY N/A 10/30/2016   Procedure: GIVENS CAPSULE STUDY;  Surgeon: Irene Shipper, MD;  Location: [facility];  Service: Endoscopy;  Laterality: N/A;  NEEDS TO BE ADMITTED FOR OBSERVATION PT. HAS DEFIB  . HEMORROIDECTOMY  80's  . HOLMIUM LASER APPLICATION Right 08/20/4625   Procedure: HOLMIUM LASER APPLICATION;  Surgeon: Irine Seal, MD;  Location: [facility];  Service: Urology;  Laterality: Right;  . HYSTEROSCOPY WITH D & C  09/02/2010   Procedure: DILATATION AND CURETTAGE (D&C) /HYSTEROSCOPY;  Surgeon: Margarette Asal;  Location: [facility];  Service: Gynecology;  Laterality: N/A;  Dilation and Curettage with Hysteroscopy and Polypectomy  . IMPLANTABLE CARDIOVERTER DEFIBRILLATOR (ICD) GENERATOR CHANGE N/A 02/10/2014   Procedure:  ICD GENERATOR CHANGE;  Surgeon: Sanda Klein, MD;  Location: [facility];  Service: Cardiovascular;  Laterality: N/A;  . LEFT HEART CATHETERIZATION WITH CORONARY ANGIOGRAM N/A 07/28/2011   Procedure: LEFT HEART CATHETERIZATION WITH CORONARY ANGIOGRAM;  Surgeon: Lorretta Harp, MD;  Location: [facility];  Service: Cardiovascular;  Laterality: N/A;  . LEFT HEART CATHETERIZATION WITH CORONARY ANGIOGRAM N/A 12/31/2013   Procedure: LEFT HEART CATHETERIZATION WITH CORONARY ANGIOGRAM;  Surgeon: Wellington Hampshire, MD;  Location: [facility] CATH [facility];  Service: Cardiovascular;  Laterality: N/A;   Family History:  Family History  Problem Relation Age of Onset  . Stroke Mother   . Other Mother        brain tumor  . Other Father        MI  . Heart attack Father   . Stroke Sister   . Macular degeneration Sister   . Diabetes Daughter   . Cancer Daughter        ovarian  . Colon polyps Daughter   . Atrial fibrillation Sister   . Hyperlipidemia Sister   . Osteoporosis Sister   . Stroke Sister   . Uterine cancer Sister   . Heart attack Brother   . Heart disease Brother   . Asthma Brother   . Breast cancer Neg Hx    Social History:  Social History   Tobacco Use  . Smoking status: Never Smoker  . Smokeless tobacco: Never Used  Vaping Use  . Vaping Use: Never used  Substance Use Topics  . Alcohol use: No  . Drug use: No    Home Medications:  Prior to Admission medications   Medication Sig Start Date End Date Taking? Authorizing Provider  acetaminophen (TYLENOL) 500 MG tablet Take 500 mg by mouth every 6 (six) hours as needed.    [provider]  amiodarone (PACERONE) 200 MG tablet TAKE 1 TABLET DAILY 03/15/20   Croitoru, Dani Gobble, MD  apixaban (ELIQUIS) 2.5 MG TABS tablet Take 1 tablet (2.5 mg total) by mouth 2 (two) times daily. 02/11/20   Croitoru, Mihai, MD  carboxymethylcellulose (REFRESH PLUS) 0.5 % SOLN Place 1 drop into both eyes 3 (three) times daily as needed (dry/irritated  eyes.).    [provider]  Cholecalciferol (VITAMIN D) 50 MCG (2000 UT) CAPS Take  2,000 Units by mouth daily.     [provider]  diphenoxylate-atropine (LOMOTIL) 2.5-0.025 MG tablet TAKE 1 TABLET EVERY 8 HOURS AS NEEDED FOR DIARRHEA 03/25/19   Ronnie Doss M, DO  feeding supplement (ENSURE ENLIVE / ENSURE PLUS) LIQD Take 237 mLs by mouth 2 (two) times daily between meals. 12/26/19   Mikhail, Velta Addison, DO  hydrALAZINE (APRESOLINE) 25 MG tablet Take 25 mg by mouth 3 (three) times daily. 01/16/20   [provider]  HYDROcodone-acetaminophen (NORCO) 5-325 MG tablet Take 1 tablet by mouth every 4 (four) hours as needed for moderate pain. 03/10/20   McKenzie, Candee Furbish, MD  isosorbide mononitrate (IMDUR) 30 MG 24 hr tablet TAKE ONE HALF (1/2) TABLET DAILY Patient taking differently: Take 15 mg by mouth daily. 09/23/19   Croitoru, Mihai, MD  levothyroxine (SYNTHROID) 100 MCG tablet Take 1 tablet (100 mcg total) by mouth daily. 01/27/20   Croitoru, Mihai, MD  loratadine (CLARITIN) 10 MG tablet Take 1 tablet (10 mg total) by mouth daily. (for itching) 01/20/19   [name], DO  meclizine (ANTIVERT) 25 MG tablet Take 25 mg by mouth 3 (three) times daily as needed for dizziness or nausea. 07/22/19   [provider]  metoprolol tartrate (LOPRESSOR) 25 MG tablet Take by mouth. 03/15/20   [provider]  Multiple Vitamins-Minerals (CENTRUM SILVER ULTRA WOMENS PO) Take 1 tablet by mouth daily.    [provider]  nitroGLYCERIN (NITROSTAT) 0.4 MG SL tablet Place 1 tablet (0.4 mg total) under the tongue every 5 (five) minutes as needed for chest pain. 12/20/18   Claretta Fraise, MD  ondansetron (ZOFRAN) 4 MG tablet Take 1 tablet (4 mg total) by mouth every 6 (six) hours as needed for nausea or vomiting. 07/01/05   Delora Fuel, MD  pantoprazole (PROTONIX) 40 MG tablet TAKE 1 TABLET ONCE DAILY 03/15/20   Ronnie Doss M, DO  PARoxetine (PAXIL) 20 MG tablet  TAKE (1) TABLET DAILY IN THE MORNING. 03/15/20   Ronnie Doss M, DO  rosuvastatin (CRESTOR) 10 MG tablet Take 1 tablet (10 mg total) by mouth daily. 02/24/20   [name], DO  tamsulosin (FLOMAX) 0.4 MG CAPS capsule Take 1 capsule (0.4 mg total) by mouth daily. 03/10/20   McKenzie, Candee Furbish, MD    Inpatient Medications:  Current Facility-Administered Medications:  .  apixaban (ELIQUIS) tablet 2.5 mg, 2.5 mg, Oral, BID, Adefeso, Oladapo, DO, 2.5 mg at 03/28/20 1019 .  hydrALAZINE (APRESOLINE) injection 10 mg, 10 mg, Intravenous, Once PRN, Adefeso, Oladapo, DO .  hydrALAZINE (APRESOLINE) tablet 25 mg, 25 mg, Oral, TID, Adefeso, Oladapo, DO, 25 mg at 03/28/20 1019 .  HYDROmorphone (DILAUDID) injection 1 mg, 1 mg, Intravenous, Q3H PRN, Manuella Ghazi, Pratik D, DO, 1 mg at 03/28/20 1019 .  isosorbide mononitrate (IMDUR) 24 hr tablet 15 mg, 15 mg, Oral, Daily, Adefeso, Oladapo, DO, 15 mg at 03/28/20 1032 .  levothyroxine (SYNTHROID) tablet 100 mcg, 100 mcg, Oral, Daily, Adefeso, Oladapo, DO, 100 mcg at 03/28/20 1019 .  potassium chloride 10 mEq in 100 mL IVPB, 10 mEq, Intravenous, Q1 Hr x 2, Adefeso, Oladapo, DO, Last Rate: 100 mL/hr at 03/28/20 0839, 10 mEq at 03/28/20 0839 .  prochlorperazine (COMPAZINE) injection 10 mg, 10 mg, Intravenous, Q6H PRN, Adefeso, Oladapo, DO .  rosuvastatin (CRESTOR) tablet 10 mg, 10 mg, Oral, Daily, Adefeso, Oladapo, DO, 10 mg at 03/28/20 1019 Allergies: Sotalol, Bactrim [sulfamethoxazole-trimethoprim], Ciprofloxacin, Tramadol, Actos [pioglitazone], Adhesive [tape], Atorvastatin, Contrast media [iodinated diagnostic agents], Latex,  Pedi-pre tape spray [wound dressing adhesive], and Sulfa antibiotics  Complete Review of Systems: GENERAL: negative for malaise, night sweats HEENT: No changes in hearing or vision, no nose bleeds or other nasal problems. NECK: Negative for lumps, goiter, pain and significant neck swelling RESPIRATORY: Negative for cough,  wheezing CARDIOVASCULAR: Negative for chest pain, leg swelling, palpitations, orthopnea GI: SEE HPI MUSCULOSKELETAL: Negative for joint pain or swelling, back pain, and muscle pain. SKIN: Negative for lesions, rash PSYCH: Negative for sleep disturbance, mood disorder and recent psychosocial stressors. HEMATOLOGY Negative for prolonged bleeding, bruising easily, and swollen nodes. ENDOCRINE: Negative for cold or heat intolerance, polyuria, polydipsia and goiter. NEURO: negative for tremor, gait imbalance, syncope and seizures. The remainder of the review of systems is noncontributory.  Physical Exam: BP (!) 192/85 (BP Location: Right Arm)   Pulse 87   Temp 98.1 F (36.7 C) (Oral)   Resp 18   Ht _0  (1.6 m)   Wt 58.5 kg   SpO2 99%   BMI 22.85 kg/m  GENERAL: The patient is AO x3, in no acute distress. HEENT: Head is normocephalic and atraumatic. EOMI are intact. Mouth is well hydrated and without lesions. NECK: Supple. No masses LUNGS: Clear to auscultation. No presence of rhonchi/wheezing/rales. Adequate chest expansion HEART: RRR, normal s1 and s2. ABDOMEN: tender to palpation in the upper abdomen, worse in the epigastric area, no guarding, no peritoneal signs, and nondistended. BS +. No masses. EXTREMITIES: Without any cyanosis, clubbing, rash, lesions or edema. NEUROLOGIC: AOx3, no focal motor deficit. SKIN: no jaundice, no rashes  Laboratory Data CBC:     Component Value Date/Time   WBC 5.0 03/27/2020 2349   RBC 3.78 (L) 03/27/2020 2349   HGB 11.5 (L) 03/27/2020 2349   HGB 12.3 02/05/2020 1111   HCT 35.0 (L) 03/27/2020 2349   HCT 36.8 02/05/2020 1111   PLT 318 03/27/2020 2349   PLT 223 02/05/2020 1111   MCV 92.6 03/27/2020 2349   MCV 92 02/05/2020 1111   MCH 30.4 03/27/2020 2349   MCHC 32.9 03/27/2020 2349   RDW 14.0 03/27/2020 2349   RDW 12.8 02/05/2020 1111   LYMPHSABS 1.5 03/27/2020 2349   LYMPHSABS 1.3 01/05/2020 1231   MONOABS 0.6 03/27/2020 2349   EOSABS  0.3 03/27/2020 2349   EOSABS 0.6 (H) 01/05/2020 1231   BASOSABS 0.1 03/27/2020 2349   BASOSABS 0.1 01/05/2020 1231   COAG:  Lab Results  Component Value Date   INR 1.3 (H) 11/14/2019   INR 1.2 01/09/2019   INR 1.0 02/05/2014    BMP:  BMP Latest Ref Rng & Units 03/27/2020 03/24/2020 03/23/2020  Glucose 70 - 99 mg/dL 98 117(H) 100(H)  BUN 8 - 23 mg/dL _1 Creatinine 0.44 - 1.00 mg/dL 1.17(H) 1.05(H) 1.17(H)  BUN/Creat Ratio 12 - 28 - - 10(L)  Sodium 135 - 145 mmol/L 132(L) 131(L) 137  Potassium 3.5 - 5.1 mmol/L 3.2(L) 3.6 4.3  Chloride 98 - 111 mmol/L 97(L) 98 98  CO2 22 - 32 mmol/L _2 Calcium 8.9 - 10.3 mg/dL 8.8(L) 8.9 9.0    HEPATIC:  Hepatic Function Latest Ref Rng & Units 03/27/2020 03/24/2020 03/23/2020  Total Protein 6.5 - 8.1 g/dL 6.6 6.5 6.4  Albumin 3.5 - 5.0 g/dL 3.2(L) 3.1(L) 3.4(L)  AST 15 - 41 U/L 38 45(H) 62(H)  ALT 0 - 44 U/L 34 45(H) 56(H)  Alk Phosphatase 38 - 126 U/L 68 69 93  Total Bilirubin 0.3 - 1.2 mg/dL  0.7 0.7 0.5  Bilirubin, Direct 0.0 - 0.2 mg/dL - - -    CARDIAC:  Lab Results  Component Value Date   CKTOTAL 45 07/26/2011   CKMB 2.0 07/26/2011   TROPONINI <0.30 12/31/2013     Imaging: I personally reviewed and interpreted the available imaging.  Assessment & Plan: [name] is a 87 y.o. female with complex history of chronic abdominal pain, atrial fibrillation, coronary artery disease status post stent in the past, CKD, diverticulosis, diabetes, previous DVT, GERD, hypertension, history of a cardiac arrest status post ICD placement, macular degeneration, peripheral vascular disease, who came to the hospital for evaluation of worsening abdominal pain.  The patient has had extensive investigations in the past including normal cross-sectional abdominal imaging including CT of the abdomen, CTA of the abdomen, liver ultrasound and upper gastrointestinal endoscopy, which were actually repeated during this hospitalization and have been  unremarkable.  It seems that her abdominal pain has central component as most the organic causes of this pain have been evaluated. Will check AM cortisol tomorrow to rule out adrenal insufficiency. As she is not able to tolerate Elavil due to QTC prolongation, she may benefit from improving her gastric accommodation with BuSpar 5 mg 3 times daily.  She can also benefit from taking Levsin as needed every 6 hours.  # Chronic epigastric abdominal pain - Start Buspar 5 mg TID - Levsin every 6 hours PRN - AM cortisol level  Harvel Quale, MD Gastroenterology and Hepatology [facility]

## 2020-03-28 NOTE — Progress Notes (Signed)
Patient seen and evaluated at bedside with ongoing intractable pain.  She has been admitted after midnight.  Briefly:  [name] is a 87 y.o. female with medical history significant for atrial fibrillation on Eliquis, CAD, HTN, HLD,AICD, CKD stage III, IBS, nephrolithiasis, arthritis, history of COVID-19 in 12/2018, and GERD with esophagitis who presented with complaint of several weeks of ongoing upper abdominal pain.  Patient has been admitted for abdominal pain evaluation, but no significant findings noted on lab work or imaging is apparent.  Appreciate further GI evaluation.  Intractable abdominal pain -Uncertain etiology  GERD/nausea -IV Compazine as needed for nausea  Chronic hyponatremia -IV hydration  Hypokalemia -Replete and reevaluate in a.m.  Lactic acidosis-resolved -Likely related to dehydration  Hypothyroidism -Continue Synthroid  Chronic atrial fibrillation -Continue Eliquis -Currently in NSR -Amiodarone and metoprolol held due to prolonged QTC  CKD IIIa  Essential hypertension -Continue hydralazine and Imdur  Dyslipidemia -Continue Crestor  Status post AICD  Tried calling family to update with no response  Total care time: 35 minutes

## 2020-03-28 NOTE — Progress Notes (Signed)
Patients IV infiltrated to LFA , Ice applied to swelling

## 2020-03-28 NOTE — ED Provider Notes (Signed)
[facility] Provider Note   CSN: [id number] Arrival date & time: 03/27/20  2223     History No chief complaint on file.   [name] is a 87 y.o. female.  Patient with history of CAD, AICD, CKD, diabetes, atrial fibrillation on Eliquis, esophagitis presenting with recurrent abdominal pain.  She reports ongoing upper abdominal pain that has been coming and going for the past several weeks.  She has had multiple previous evaluations for the same.  She was last seen in the ED 2 days ago with a reassuring work-up.  It was thought that she recently passed a kidney stone.  Patient does not think this is the case.  She describes pain in her epigastrium and right upper quadrant that comes and goes lasting for several hours at a time.  It is worse when she tries to eat.  She has had nausea but no vomiting.  Denies any pain with urination or blood in the urine.  Denies any change in bowel habits.  No chest pain or shortness of breath.  The history is provided by the patient.       Past Medical History:  Diagnosis Date  . AICD (automatic cardioverter/defibrillator) present   . Allergy    SESONAL  . Anxiety   . ARTHRITIS   . Arthritis   . Atrial fibrillation ([place])   . CAD (coronary artery disease)    a. history of cardiac arrest 1993;  b. s/p LAD/LCX stenting in 2003;  c. 07/2011 Cath: LM nl, LAD patent stent, D1 80ost, LCX patent stent, RCA min irregs;  d. 04/2012 MV: EF 66%, no ishcemia;  e. 12/2013 Echo: Ef 55%, no rwma, Gr 1 DD, triv AI/MR, mildly dil LA;  f. 12/2013 Lexi MV: intermediate risk - apical ischemia and inf/infsept fixed defect, ? artifactual.  . CAD in native artery 12/31/2013   Previous stents to LAD and Ramus patent on cath today 12/31/13 also There is severe disease in the ostial first diagonal which is unchanged from most recent cardiac catheterization. The right coronary artery could not be engaged selectively but nonselective angiography showed no  significant disease in the proximal and midsegment.     . Cataract    DENIES  . Chronic back pain   . Chronic sinus bradycardia   . CKD (chronic kidney disease), stage III ([place])   . Clotting disorder (HCC)    DVT  . COLITIS 12/02/2007   Qualifier: Diagnosis of  By: Nils Pyle CMA ([place]), [name]    . Diabetes mellitus without complication ([place])    DENIES  . Diverticulosis of colon (without mention of hemorrhage) 2007   Colonoscopy   . Esophageal stricture    a. 2012 s/p dil.  . Esophagitis, unspecified    a. 2012 EGD  . EXTERNAL HEMORRHOIDS   . GERD (gastroesophageal reflux disease)    omeprazole  . H/O hiatal hernia   . Hiatal hernia    a. 2012 EGD.  Marland Kitchen History of DVT in the past, not on Coumadin now    left  leg  . HYPERCHOLESTEROLEMIA   . Hypertension   . ICD (implantable cardiac defibrillator) in place    a. s/p initial ICD in 1993 in setting of cardiac arrest;  b. 01/2007 gen change: Guidant T135 Vitality DS VR single lead ICD.  . Macular degeneration    gets injecton in eye every 5 weeks- last injection - 05/03/2013   . Myocardial infarction ([place]) 1993  . Near syncope  05/22/2015  . Nephrolithiasis, just saw Dr [name]   . Orthostatic hypotension   . Peripheral vascular disease ([place])    ???  . RECTAL BLEEDING 12/03/2007   Qualifier: Diagnosis of  By: Sharlett Iles MD Byrd Hesselbach   . Unspecified gastritis and gastroduodenitis without mention of hemorrhage    a. 2003 EGD->not noted on 2012 EGD.  Marland Kitchen Unstable angina, neg MI, cath stable.maybe GI 12/30/2013    Patient Active Problem List   Diagnosis Date Noted  . Malnutrition of moderate degree 12/26/2019  . Prolonged QT interval 12/23/2019  . Acute lower UTI 12/23/2019  . Constipation 11/27/2019  . Diarrhea 11/27/2019  . Lesion of pancreas 11/26/2019  . Fibromuscular dysplasia of right renal artery ([place]) 11/26/2019  . Left renal artery stenosis ([place]) 11/26/2019  . RUQ pain   . Abnormal CT of the abdomen   . Vertigo  11/15/2019  . Advanced care planning/counseling discussion 11/15/2019  . CAD (coronary artery disease)   . Hypothyroidism   . Hyponatremia   . Volume depletion   . TSH elevation 08/01/2019  . Hospital discharge follow-up 08/01/2019  . Exudative age-related macular degeneration of right eye with active choroidal neovascularization ([place]) 04/30/2019  . Retinal hemorrhage of right eye 04/30/2019  . Advanced nonexudative age-related macular degeneration of left eye with subfoveal involvement 04/30/2019  . Exudative age-related macular degeneration of left eye with inactive choroidal neovascularization ([place]) 04/30/2019  . Advanced nonexudative age-related macular degeneration of right eye without subfoveal involvement 04/30/2019  . Posterior vitreous detachment of both eyes 04/30/2019  . Secondary hypercoagulable state ([place]) 03/11/2019  . Persistent atrial fibrillation ([place])   . Proctitis 01/09/2019  . Atrial fibrillation, chronic ([place]) 01/09/2019  . Hyperlipidemia 01/09/2019  . Nausea and vomiting 01/09/2019  . Hyperglycemia 01/09/2019  . COVID-19 virus infection 12/28/2018  . Chronic diarrhea 09/18/2018  . Abdominal pain 09/18/2018  . Osteoarthritis of right knee 03/29/2018  . GERD (gastroesophageal reflux disease)   . Diarrhea of presumed infectious origin 02/06/2018  . Acute kidney injury superimposed on CKD ([place]) 02/05/2018  . Essential hypertension 12/21/2017  . Degenerative scoliosis 02/19/2017  . Burst fracture of lumbar vertebra ([place]) 02/19/2017  . History of drug-induced prolonged QT interval with torsade de pointes 11/01/2016  . Iron deficiency anemia 10/30/2016  . Long term current use of anticoagulant 02/17/2016  . Near syncope 05/22/2015  . Diabetes mellitus type 2, diet-controlled ([place]) 09/14/2014  . ICD (implantable cardioverter-defibrillator) battery depletion 01/20/2014  . Abnormal nuclear stress test, 12/30/13 12/31/2013  . Coronary artery disease involving native  coronary artery of native heart without angina pectoris 12/31/2013  . Paroxysmal atrial fibrillation, chads2 Vasc2 score of 5, on eliquis 10/16/2013  . Catecholaminergic polymorphic ventricular tachycardia ([place]) 10/16/2013  . Ureteral stone with hydronephrosis 05/20/2013  . Metabolic syndrome [id number]  . Orthostatic hypotension 07/27/2011  . Chest pain 07/25/2011  . Weakness 07/25/2011    Class: Acute  . Chronic sinus bradycardia 07/25/2011  . ICD (implantable cardioverter-defibrillator) in place 07/25/2011  . CKD (chronic kidney disease) stage 3, GFR 30-59 ml/min (HCC) 07/25/2011    Class: Chronic  . History of DVT in the past, on eliquis now 07/25/2011    Class: History of  . Nephrolithiasis, just saw Dr [name] 07/25/2011    Class: History of  . DYSPHAGIA 03/24/2010  . Chronic ischemic heart disease 12/03/2007  . Irritable bowel syndrome with diarrhea 12/03/2007  . EPIGASTRIC PAIN 12/03/2007  . Hyperlipidemia associated with type 2 diabetes mellitus ([place]) 12/02/2007  Class: History of  . EXTERNAL HEMORRHOIDS 12/02/2007    Class: History of  . ESOPHAGITIS 12/02/2007  . GASTRITIS 12/02/2007  . DIVERTICULOSIS, COLON 12/02/2007  . ARTHRITIS 12/02/2007    Class: Chronic    Past Surgical History:  Procedure Laterality Date  . BREAST BIOPSY Left 11/2013  . CARDIAC CATHETERIZATION    . CARDIAC CATHETERIZATION  01/01/15   patent stents -there is severe disease in ostial 1st diag but without change.   Marland Kitchen CARDIAC DEFIBRILLATOR PLACEMENT  02/05/2007   Guidant  . CARDIOVASCULAR STRESS TEST  12/30/2013   abnormal  . CARDIOVERSION N/A 02/13/2019   Procedure: CARDIOVERSION;  Surgeon: Sanda Klein, MD;  Location: [facility];  Service: Cardiovascular;  Laterality: N/A;  . CATARACT EXTRACTION Right    Dr. Katy Fitch  . COLONOSCOPY    . coronary stents     . CYSTOSCOPY WITH URETEROSCOPY Right 05/20/2013   Procedure: CYSTOSCOPY, RIGHT URETEROSCOPY STONE EXTRACTION, Insertion of  right DOUBLE J STENT ;  Surgeon: Irine Seal, MD;  Location: [facility];  Service: Urology;  Laterality: Right;  . CYSTOSCOPY WITH URETEROSCOPY AND STENT PLACEMENT N/A 06/03/2013   Procedure: SECOND LOOK CYSTOSCOPY WITH URETEROSCOPY  HOLMIUM LASER LITHO AND STONE EXTRACTION Sammie Bench ;  Surgeon: Malka So, MD;  Location: [facility];  Service: Urology;  Laterality: N/A;  . ESOPHAGOGASTRODUODENOSCOPY (EGD) WITH PROPOFOL N/A 11/20/2019   Procedure: ESOPHAGOGASTRODUODENOSCOPY (EGD) WITH PROPOFOL;  Surgeon: Daneil Dolin, MD;  [facility];  Service: Endoscopy;  Laterality: N/A;  . GIVENS CAPSULE STUDY N/A 10/30/2016   Procedure: GIVENS CAPSULE STUDY;  Surgeon: Irene Shipper, MD;  Location: [facility];  Service: Endoscopy;  Laterality: N/A;  NEEDS TO BE ADMITTED FOR OBSERVATION PT. HAS DEFIB  . HEMORROIDECTOMY  80's  . HOLMIUM LASER APPLICATION Right [id number]   Procedure: HOLMIUM LASER APPLICATION;  Surgeon: Irine Seal, MD;  Location: [facility];  Service: Urology;  Laterality: Right;  . HYSTEROSCOPY WITH D & C  09/02/2010   Procedure: DILATATION AND CURETTAGE (D&C) /HYSTEROSCOPY;  Surgeon: Margarette Asal;  Location: [facility];  Service: Gynecology;  Laterality: N/A;  Dilation and Curettage with Hysteroscopy and Polypectomy  . IMPLANTABLE CARDIOVERTER DEFIBRILLATOR (ICD) GENERATOR CHANGE N/A 02/10/2014   Procedure: ICD GENERATOR CHANGE;  Surgeon: Sanda Klein, MD;  Location: [facility];  Service: Cardiovascular;  Laterality: N/A;  . LEFT HEART CATHETERIZATION WITH CORONARY ANGIOGRAM N/A 07/28/2011   Procedure: LEFT HEART CATHETERIZATION WITH CORONARY ANGIOGRAM;  Surgeon: Lorretta Harp, MD;  Location: [facility];  Service: Cardiovascular;  Laterality: N/A;  . LEFT HEART CATHETERIZATION WITH CORONARY ANGIOGRAM N/A 12/31/2013   Procedure: LEFT HEART CATHETERIZATION WITH CORONARY ANGIOGRAM;  Surgeon: Wellington Hampshire, MD;  Location: [facility];  Service: Cardiovascular;  Laterality: N/A;     OB  History   No obstetric history on file.     Family History  Problem Relation Age of Onset  . Stroke Mother   . Other Mother        brain tumor  . Other Father        MI  . Heart attack Father   . Stroke Sister   . Macular degeneration Sister   . Diabetes Daughter   . Cancer Daughter        ovarian  . Colon polyps Daughter   . Atrial fibrillation Sister   . Hyperlipidemia Sister   . Osteoporosis Sister   . Stroke Sister   . Uterine cancer Sister   .  Heart attack Brother   . Heart disease Brother   . Asthma Brother   . Breast cancer Neg Hx     Social History   Tobacco Use  . Smoking status: Never Smoker  . Smokeless tobacco: Never Used  Vaping Use  . Vaping Use: Never used  Substance Use Topics  . Alcohol use: No  . Drug use: No    Home Medications Prior to Admission medications   Medication Sig Start Date End Date Taking? Authorizing Provider  acetaminophen (TYLENOL) 500 MG tablet Take 500 mg by mouth every 6 (six) hours as needed.    [provider]  amiodarone (PACERONE) 200 MG tablet TAKE 1 TABLET DAILY 03/15/20   Croitoru, Dani Gobble, MD  apixaban (ELIQUIS) 2.5 MG TABS tablet Take 1 tablet (2.5 mg total) by mouth 2 (two) times daily. 02/11/20   Croitoru, Mihai, MD  carboxymethylcellulose (REFRESH PLUS) 0.5 % SOLN Place 1 drop into both eyes 3 (three) times daily as needed (dry/irritated eyes.).    [provider]  Cholecalciferol (VITAMIN D) 50 MCG (2000 UT) CAPS Take 2,000 Units by mouth daily.     [provider]  diphenoxylate-atropine (LOMOTIL) 2.5-0.025 MG tablet TAKE 1 TABLET EVERY 8 HOURS AS NEEDED FOR DIARRHEA 03/25/19   Ronnie Doss M, DO  feeding supplement (ENSURE ENLIVE / ENSURE PLUS) LIQD Take 237 mLs by mouth 2 (two) times daily between meals. 12/26/19   Mikhail, Velta Addison, DO  hydrALAZINE (APRESOLINE) 25 MG tablet Take 25 mg by mouth 3 (three) times daily. 01/16/20   [provider]  HYDROcodone-acetaminophen (NORCO)  5-325 MG tablet Take 1 tablet by mouth every 4 (four) hours as needed for moderate pain. 03/10/20   McKenzie, Candee Furbish, MD  isosorbide mononitrate (IMDUR) 30 MG 24 hr tablet TAKE ONE HALF (1/2) TABLET DAILY Patient taking differently: Take 15 mg by mouth daily. 09/23/19   Croitoru, Mihai, MD  levothyroxine (SYNTHROID) 100 MCG tablet Take 1 tablet (100 mcg total) by mouth daily. 01/27/20   Croitoru, Mihai, MD  loratadine (CLARITIN) 10 MG tablet Take 1 tablet (10 mg total) by mouth daily. (for itching) 01/20/19   [name], DO  meclizine (ANTIVERT) 25 MG tablet Take 25 mg by mouth 3 (three) times daily as needed for dizziness or nausea. 07/22/19   [provider]  metoprolol tartrate (LOPRESSOR) 25 MG tablet Take by mouth. 03/15/20   [provider]  Multiple Vitamins-Minerals (CENTRUM SILVER ULTRA WOMENS PO) Take 1 tablet by mouth daily.    [provider]  nitroGLYCERIN (NITROSTAT) 0.4 MG SL tablet Place 1 tablet (0.4 mg total) under the tongue every 5 (five) minutes as needed for chest pain. 12/20/18   Claretta Fraise, MD  ondansetron (ZOFRAN) 4 MG tablet Take 1 tablet (4 mg total) by mouth every 6 (six) hours as needed for nausea or vomiting. [id number]   Delora Fuel, MD  pantoprazole (PROTONIX) 40 MG tablet TAKE 1 TABLET ONCE DAILY 03/15/20   Ronnie Doss M, DO  PARoxetine (PAXIL) 20 MG tablet TAKE (1) TABLET DAILY IN THE MORNING. 03/15/20   Ronnie Doss M, DO  rosuvastatin (CRESTOR) 10 MG tablet Take 1 tablet (10 mg total) by mouth daily. 02/24/20   [name], DO  tamsulosin (FLOMAX) 0.4 MG CAPS capsule Take 1 capsule (0.4 mg total) by mouth daily. 03/10/20   McKenzie, Candee Furbish, MD    Allergies    Sotalol, Bactrim [sulfamethoxazole-trimethoprim], Ciprofloxacin, Tramadol, Actos [pioglitazone], Adhesive [tape], Atorvastatin, Contrast media [iodinated diagnostic  agents], Latex, Pedi-pre tape spray [wound dressing adhesive], and Sulfa antibiotics  Review of  Systems   Review of Systems  Constitutional: Positive for activity change and appetite change. Negative for fever.  HENT: Negative for congestion and rhinorrhea.   Gastrointestinal: Positive for abdominal pain. Negative for nausea and vomiting.  Genitourinary: Negative for dysuria and hematuria.  Musculoskeletal: Negative for arthralgias and myalgias.  Skin: Negative for rash.  Neurological: Positive for weakness. Negative for dizziness, light-headedness and headaches.   all other systems are negative except as noted in the HPI and PMH.   Physical Exam Updated Vital Signs BP (!) 166/75   Pulse 71   Resp 16   Ht '5\' 3"'$  (1.6 m)   Wt 61.2 kg   SpO2 100%   BMI 23.90 kg/m   Physical Exam Vitals and nursing note reviewed.  Constitutional:      General: She is not in acute distress.    Appearance: She is well-developed.  HENT:     Head: Normocephalic and atraumatic.     Mouth/Throat:     Pharynx: No oropharyngeal exudate.  Eyes:     Conjunctiva/sclera: Conjunctivae normal.     Pupils: Pupils are equal, round, and reactive to light.  Neck:     Comments: No meningismus. Cardiovascular:     Rate and Rhythm: Normal rate and regular rhythm.     Heart sounds: Normal heart sounds. No murmur heard.   Pulmonary:     Effort: Pulmonary effort is normal. No respiratory distress.     Breath sounds: Normal breath sounds.  Abdominal:     Palpations: Abdomen is soft.     Tenderness: There is abdominal tenderness. There is no guarding or rebound.     Comments: Abdomen is soft, tender epigastrium and tender right upper quadrant. Lower abdomen soft and nontender.  Equal femoral pulses.  Equal DP and PT pulse  Musculoskeletal:        General: No tenderness. Normal range of motion.     Cervical back: Normal range of motion and neck supple.  Skin:    General: Skin is warm.     Capillary Refill: Capillary refill takes less than 2 seconds.  Neurological:     General: No focal deficit  present.     Mental Status: She is alert and oriented to person, place, and time. Mental status is at baseline.     Cranial Nerves: No cranial nerve deficit.     Motor: No abnormal muscle tone.     Coordination: Coordination normal.     Comments:  5/5 strength throughout. CN 2-12 intact.Equal grip strength.   Psychiatric:        Behavior: Behavior normal.     ED Results / Procedures / Treatments   Labs (all labs ordered are listed, but only abnormal results are displayed) Labs Reviewed  CBC WITH DIFFERENTIAL/PLATELET - Abnormal; Notable for the following components:      Result Value   RBC 3.78 (*)    Hemoglobin 11.5 (*)    HCT 35.0 (*)    All other components within normal limits  COMPREHENSIVE METABOLIC PANEL - Abnormal; Notable for the following components:   Sodium 132 (*)    Potassium 3.2 (*)    Chloride 97 (*)    Creatinine, Ser 1.17 (*)    Calcium 8.8 (*)    Albumin 3.2 (*)    GFR, Estimated 45 (*)    All other components within normal limits  LACTIC ACID, PLASMA - Abnormal;  Notable for the following components:   Lactic Acid, Venous 2.7 (*)    All other components within normal limits  LACTIC ACID, PLASMA - Abnormal; Notable for the following components:   Lactic Acid, Venous 2.5 (*)    All other components within normal limits  URINE CULTURE  RESP PANEL BY RT-PCR (FLU A&B, COVID) [id number]  LIPASE, BLOOD  URINALYSIS, ROUTINE W REFLEX MICROSCOPIC  TROPONIN I (HIGH SENSITIVITY)  TROPONIN I (HIGH SENSITIVITY)    EKG EKG Interpretation  Date/Time:  Sunday March 28 2020 03:23:38 EDT Ventricular Rate:  88 PR Interval:    QRS Duration: 183 QT Interval:  503 QTC Calculation: 609 R Axis:   -19 Text Interpretation: Sinus rhythm Left bundle branch block Interpretation limited secondary to artifact Confirmed by Ezequiel Essex [id number]) on 03/28/2020 3:33:23 AM   Radiology CT Angio Abd/Pel W and/or Wo Contrast  Result Date: 03/28/2020 CLINICAL DATA:  Chronic  mesenteric ischemia. Right-sided abdominal pain. EXAM: CTA ABDOMEN AND PELVIS WITHOUT AND WITH CONTRAST TECHNIQUE: Multidetector CT imaging of the abdomen and pelvis was performed using the standard protocol during bolus administration of intravenous contrast. Multiplanar reconstructed images and MIPs were obtained and reviewed to evaluate the vascular anatomy. CONTRAST:  23m OMNIPAQUE IOHEXOL 350 MG/ML SOLN COMPARISON:  None. FINDINGS: VASCULAR Aorta: Aortic course and caliber are normal. There is calcific aortic atherosclerosis. Focal dissection of the infrarenal aorta is unchanged (4:81). Celiac: Patent without evidence of aneurysm, dissection, vasculitis or significant stenosis. SMA: Patent without evidence of aneurysm, dissection, vasculitis or significant stenosis. Renals: Calcification causes unchanged stenosis of the proximal left renal artery. Right renal artery is normal. IMA: Patent with mild narrowing at the origin. Inflow: Severe stenosis of the left internal iliac artery due to mixed density atherosclerosis, unchanged. Otherwise unremarkable. Proximal Outflow: Bilateral common femoral and visualized portions of the superficial and profunda femoral arteries are patent without evidence of aneurysm, dissection, vasculitis or significant stenosis. Veins: The main portal vein, superior mesenteric vein and splenic vein are patent. The IVC is patent. Review of the MIP images confirms the above findings. NON-VASCULAR Lower chest: No acute abnormality. Hepatobiliary: No focal liver abnormality is seen. No gallstones, gallbladder wall thickening, or biliary dilatation. Pancreas: Unremarkable. No pancreatic ductal dilatation or surrounding inflammatory changes. Spleen: Normal in size without focal abnormality. Adrenals/Urinary Tract: Unchanged appearance of the kidneys with bilateral nephrocalcinosis. No hydronephrosis. Urinary bladder is distended. Stomach/Bowel: Small hiatal hernia. No bowel wall thickening.  No pneumatosis. Lymphatic: No lymphadenopathy. Reproductive: Normal uterus.  No adnexal mass. Other: None Musculoskeletal: Lumbar levoscoliosis.  No acute abnormality. IMPRESSION: 1. No evidence of acute mesenteric ischemia. 2. Unchanged appearance of focal dissection of the infrarenal aorta. 3. Unchanged severe stenosis of the left internal iliac artery. 4. Unchanged stenosis of the proximal left renal artery. Aortic Atherosclerosis (ICD10-I70.0). Electronically Signed   By: KUlyses JarredM.D.   On: 03/28/2020 01:53    Procedures Procedures   Medications Ordered in ED Medications  sodium chloride 0.9 % bolus 500 mL (has no administration in time range)  fentaNYL (SUBLIMAZE) injection 25 mcg (has no administration in time range)    ED Course  I have reviewed the triage vital signs and the nursing notes.  Pertinent labs & imaging results that were available during my care of the patient were reviewed by me and considered in my medical decision making (see chart for details).  Right upper quadrant ultrasound on 11/21/2019 showed no gallstones, probable fatty liver.  CT angiogram of abdomen and pelvis  on 11/18/2019 was significant for renal calculi and an 40m cyst in the pancreatic head.  EGD in Nov 2021 with schatzki ring.   MDM Rules/Calculators/A&P                         Acute on chronic abdominal pain with multiple previous evaluations for the same.  Most recently seen 2 days ago.  She was thought to have passed a kidney stone.  Lactate mildly elevated 2.7.  Labs appear to be at baseline with normal LFTs and lipase. No gallstones on ultrasound November. Patient hydrated and given symptom control.  CT will be obtained to evaluate for mesenteric ischemia.  CTA is stable.  No evidence of acute or chronic mesenteric ischemia. Unchanged focal dissection of infrarenal abdominal aorta.  Unclear etiology of patient's abdominal pain.  She has had extensive work-up by gastroenterology in the  past without clear source.  IBS is considered.  She has had ultrasound, CTA, EGD. Looks like she has passed her kidney stone.  Unclear etiology of patient's abdominal pain.  Her family is very frustrated and wants her admitted to determine why she is hurting.  They stated that Dr. CAbbey [facility] gastroenterology wanted her to be admitted last week.  His notes do not reflect this however. Discussed with her guardian and power of attorney RLiliane Channelby phone.  Patient still having severe pain with no improvement of multiple medications.  Family wants her admitted. Discussed with Dr. AJosephine Cableswill consider admission for intractable abdominal pain Final Clinical Impression(s) / ED Diagnoses Final diagnoses:  Intractable abdominal pain    Rx / DC Orders ED Discharge Orders    None       Kylyn Sookram, SAnnie Main MD 03/28/20 [phone]

## 2020-03-29 DIAGNOSIS — R109 Unspecified abdominal pain: Secondary | ICD-10-CM

## 2020-03-29 LAB — COMPREHENSIVE METABOLIC PANEL
ALT: 29 U/L (ref 0–44)
AST: 39 U/L (ref 15–41)
Albumin: 2.8 g/dL — ABNORMAL LOW (ref 3.5–5.0)
Alkaline Phosphatase: 58 U/L (ref 38–126)
Anion gap: 8 (ref 5–15)
BUN: 12 mg/dL (ref 8–23)
CO2: 24 mmol/L (ref 22–32)
Calcium: 8.8 mg/dL — ABNORMAL LOW (ref 8.9–10.3)
Chloride: 105 mmol/L (ref 98–111)
Creatinine, Ser: 1.08 mg/dL — ABNORMAL HIGH (ref 0.44–1.00)
GFR, Estimated: 50 mL/min — ABNORMAL LOW (ref >60)
Glucose, Bld: 91 mg/dL (ref 70–99)
Potassium: 4.8 mmol/L (ref 3.5–5.1)
Sodium: 137 mmol/L (ref 135–145)
Total Bilirubin: 0.5 mg/dL (ref 0.3–1.2)
Total Protein: 6 g/dL — ABNORMAL LOW (ref 6.5–8.1)

## 2020-03-29 LAB — MAGNESIUM: Magnesium: 1.9 mg/dL (ref 1.7–2.4)

## 2020-03-29 LAB — RETICULOCYTES
Immature Retic Fract: 10.2 % (ref 2.3–15.9)
RBC.: 5.64 MIL/uL — ABNORMAL HIGH (ref 3.87–5.11)
Retic Count, Absolute: 99.3 10*3/uL (ref 19.0–186.0)
Retic Ct Pct: 1.8 % (ref 0.4–3.1)

## 2020-03-29 LAB — CBC
HCT: 33 % — ABNORMAL LOW (ref 36.0–46.0)
Hemoglobin: 10.2 g/dL — ABNORMAL LOW (ref 12.0–15.0)
MCH: 30 pg (ref 26.0–34.0)
MCHC: 30.9 g/dL (ref 30.0–36.0)
MCV: 97.1 fL (ref 80.0–100.0)
Platelets: 289 10*3/uL (ref 150–400)
RBC: 3.4 MIL/uL — ABNORMAL LOW (ref 3.87–5.11)
RDW: 14.5 % (ref 11.5–15.5)
WBC: 5.2 10*3/uL (ref 4.0–10.5)
nRBC: 0 % (ref 0.0–0.2)

## 2020-03-29 LAB — FOLATE: Folate: 36.5 ng/mL (ref >5.9)

## 2020-03-29 LAB — APTT: aPTT: 31 seconds (ref 24–36)

## 2020-03-29 LAB — VITAMIN B12: Vitamin B-12: 1049 pg/mL — ABNORMAL HIGH (ref 180–914)

## 2020-03-29 LAB — PHOSPHORUS: Phosphorus: 3.6 mg/dL (ref 2.5–4.6)

## 2020-03-29 LAB — PROTIME-INR
INR: 1.4 — ABNORMAL HIGH (ref 0.8–1.2)
Prothrombin Time: 16.8 seconds — ABNORMAL HIGH (ref 11.4–15.2)

## 2020-03-29 LAB — CORTISOL-AM, BLOOD: Cortisol - AM: 5.3 ug/dL — ABNORMAL LOW (ref 6.7–22.6)

## 2020-03-29 LAB — IRON AND TIBC
Iron: 45 ug/dL (ref 28–170)
Saturation Ratios: 15 % (ref 10.4–31.8)
TIBC: 309 ug/dL (ref 250–450)
UIBC: 264 ug/dL

## 2020-03-29 LAB — FERRITIN: Ferritin: 64 ng/mL (ref 11–307)

## 2020-03-29 MED ORDER — PANTOPRAZOLE SODIUM 40 MG PO TBEC
40.0000 mg | DELAYED_RELEASE_TABLET | Freq: Two times a day (BID) | ORAL | Status: DC
  Administered 2020-03-29 – 2020-04-06 (×16): 40 mg via ORAL
  Filled 2020-03-29 (×15): qty 1

## 2020-03-29 MED ORDER — PEG 3350-KCL-NA BICARB-NACL 420 G PO SOLR
4000.0000 mL | Freq: Once | ORAL | Status: AC
  Administered 2020-03-29: 4000 mL via ORAL

## 2020-03-29 NOTE — Progress Notes (Signed)
PROGRESS NOTE    [name]  [id number] DOB: 09-Aug-1931 DOA: 03/27/2020 PCP: [name], DO   Brief Narrative:   [name] a 87 y.o.femalewith medical history significant foratrial fibrillation on Eliquis, CAD, HTN, HLD,AICD, CKD stage III, IBS, nephrolithiasis, arthritis, history of COVID-19 in 12/2018, and GERD with esophagitis who presented with complaintof several weeks of ongoing upper abdominal pain.  Patient has been admitted for abdominal pain evaluation, but no significant findings noted on lab work or imaging is apparent.  GI evaluation is ongoing.  Assessment & Plan:   Principal Problem:   Intractable abdominal pain Active Problems:   CKD (chronic kidney disease), stage III (HCC)   Essential hypertension   Dehydration   Hypokalemia   GERD (gastroesophageal reflux disease)   Atrial fibrillation, chronic (HCC)   Hyperlipidemia   Nausea   Hypothyroidism   Hyponatremia   Prolonged QT interval   Lactic acidosis   Elevated troponin I level   Intractable abdominal pain -Improved and mostly postprandial -Uncertain etiology, but a.m. cortisol noted to be low and patient will require endocrinology follow-up -Continue Levsin and BuSpar per GI -May require colonoscopy this admission per GI -Diet advanced to full liquid  Normocytic anemia -Anemia panel ordered  GERD/nausea -IV Compazine as needed for nausea  Chronic hyponatremia-improved -IV hydration held  Lactic acidosis-resolved -Likely related to dehydration  Hypothyroidism -Continue Synthroid  Chronic atrial fibrillation -Continue Eliquis -Currently in NSR -Amiodarone and metoprolol held due to prolonged QTC  CKD IIIa  Essential hypertension -Continue hydralazine and Imdur  Dyslipidemia -Continue Crestor  Status post AICD   DVT prophylaxis: Eliquis Code Status: DNR Family Communication: Discussed with granddaughter [place] at bedside Disposition  Plan:  Status is: Inpatient  Remains inpatient appropriate because:IV treatments appropriate due to intensity of illness or inability to take PO and Inpatient level of care appropriate due to severity of illness   Dispo: The patient is from: Home              Anticipated d/c is to: Home              Patient currently is not medically stable to d/c.   Difficult to place patient No  Consultants:   GI  Procedures:   See below  Antimicrobials:   None   Subjective: Patient seen and evaluated today with ongoing concerns.  Epigastric abdominal pain with eating, but she seems to be able to tolerate intake better.  She also complains of some rectal pressure and discomfort after eating as well.  Objective: Vitals:   03/28/20 1449 03/28/20 1600 03/28/20 2054 03/29/20 0543  BP: (!) 101/53 (!) 118/54 139/71 (!) 148/62  Pulse: 75 73 68 65  Resp: '16  16 16  '$ Temp: 98.4 F (36.9 C)  98.7 F (37.1 C) 98.2 F (36.8 C)  TempSrc: Oral  Oral Oral  SpO2: 96%  96% 96%  Weight:      Height:        Intake/Output Summary (Last 24 hours) at 03/29/2020 1236 Last data filed at 03/29/2020 0900 Gross per 24 hour  Intake 672.25 ml  Output --  Net 672.25 ml   Filed Weights   03/27/20 2228 03/28/20 0636  Weight: 61.2 kg 58.5 kg    Examination:  General exam: Appears calm and comfortable, hard of hearing Respiratory system: Clear to auscultation. Respiratory effort normal. Cardiovascular system: S1 & S2 heard, RRR.  Gastrointestinal system: Abdomen is soft Central nervous system: Alert and awake Extremities: No  edema Skin: No significant lesions noted Psychiatry: Flat affect.    Data Reviewed: I have personally reviewed following labs and imaging studies  CBC: Recent Labs  Lab 03/24/20 2148 03/27/20 2349 03/29/20 0551  WBC 4.9 5.0 5.2  NEUTROABS 2.5 2.5  --   HGB 11.3* 11.5* 10.2*  HCT 34.3* 35.0* 33.0*  MCV 92.5 92.6 97.1  PLT 327 318 [id number]   Basic Metabolic Panel: Recent  Labs  Lab 03/23/20 1046 03/24/20 2148 03/27/20 2349 03/29/20 0551  NA 137 131* 132* 137  K 4.3 3.6 3.2* 4.8  CL 98 98 97* 105  CO2 '22 24 23 24  '$ GLUCOSE 100* 117* 98 91  BUN '12 18 18 12  '$ CREATININE 1.17* 1.05* 1.17* 1.08*  CALCIUM 9.0 8.9 8.8* 8.8*  MG  --   --   --  1.9  PHOS  --   --   --  3.6   GFR: Estimated Creatinine Clearance: 30.9 mL/min (A) (by C-G formula based on SCr of 1.08 mg/dL (H)). Liver Function Tests: Recent Labs  Lab 03/23/20 1046 03/24/20 2148 03/27/20 2349 03/29/20 0551  AST 62* 45* 38 39  ALT 56* 45* 34 29  ALKPHOS 93 69 68 58  BILITOT 0.5 0.7 0.7 0.5  PROT 6.4 6.5 6.6 6.0*  ALBUMIN 3.4* 3.1* 3.2* 2.8*   Recent Labs  Lab 03/24/20 2148 03/27/20 2349  LIPASE 33 33   No results for input(s): AMMONIA in the last 168 hours. Coagulation Profile: Recent Labs  Lab 03/29/20 0551  INR 1.4*   Cardiac Enzymes: No results for input(s): CKTOTAL, CKMB, CKMBINDEX, TROPONINI in the last 168 hours. BNP (last 3 results) No results for input(s): PROBNP in the last 8760 hours. HbA1C: No results for input(s): HGBA1C in the last 72 hours. CBG: Recent Labs  Lab 03/28/20 0748 03/28/20 1103 03/28/20 1636 03/28/20 2018  GLUCAP 131* 118* 144* 98   Lipid Profile: No results for input(s): CHOL, HDL, LDLCALC, TRIG, CHOLHDL, LDLDIRECT in the last 72 hours. Thyroid Function Tests: No results for input(s): TSH, T4TOTAL, FREET4, T3FREE, THYROIDAB in the last 72 hours. Anemia Panel: Recent Labs    03/28/20 2344 03/29/20 0554  VITAMINB12 1,049*  --   FERRITIN 64  --   TIBC 309  --   IRON 45  --   RETICCTPCT  --  1.8   Sepsis Labs: Recent Labs  Lab 03/27/20 2349 03/28/20 0156 03/28/20 0821 03/28/20 1036  LATICACIDVEN 2.7* 2.5* 1.3 1.0    Recent Results (from the past 240 hour(s))  Microscopic Examination     Status: Abnormal   Collection Time: 03/24/20  1:12 PM   Urine  Result Value Ref Range Status   WBC, UA 0-5 0 - 5 /hpf Final   RBC 0-2 0  - 2 /hpf Final   Epithelial Cells (non renal) >10 (A) 0 - 10 /hpf Final   Renal Epithel, UA None seen None seen /hpf Final   Bacteria, UA Few None seen/Few Final  Resp Panel by RT-PCR (Flu A&B, Covid) Nasopharyngeal Swab     Status: None   Collection Time: 03/28/20  4:16 AM   Specimen: Nasopharyngeal Swab; Nasopharyngeal(NP) swabs in vial transport medium  Result Value Ref Range Status   SARS Coronavirus 2 by RT PCR NEGATIVE NEGATIVE Final    Comment: (NOTE) SARS-CoV-2 target nucleic acids are NOT DETECTED.  The SARS-CoV-2 RNA is generally detectable in upper respiratory specimens during the acute phase of infection. The lowest concentration of SARS-CoV-2 viral copies  this assay can detect is 138 copies/mL. A negative result does not preclude SARS-Cov-2 infection and should not be used as the sole basis for treatment or other patient management decisions. A negative result may occur with  improper specimen collection/handling, submission of specimen other than nasopharyngeal swab, presence of viral mutation(s) within the areas targeted by this assay, and inadequate number of viral copies(<138 copies/mL). A negative result must be combined with clinical observations, patient history, and epidemiological information. The expected result is Negative.  Fact Sheet for Patients:  EntrepreneurPulse.com.au  Fact Sheet for Healthcare Providers:  IncredibleEmployment.be  This test is no t yet approved or cleared by the [place] FDA and  has been authorized for detection and/or diagnosis of SARS-CoV-2 by FDA under an Emergency Use Authorization (EUA). This EUA will remain  in effect (meaning this test can be used) for the duration of the COVID-19 declaration under Section 564(b)(1) of the Act, 21 U.S.C.section 360bbb-3(b)(1), unless the authorization is terminated  or revoked sooner.       Influenza A by PCR NEGATIVE NEGATIVE Final   Influenza B  by PCR NEGATIVE NEGATIVE Final    Comment: (NOTE) The Xpert Xpress SARS-CoV-2/FLU/RSV plus assay is intended as an aid in the diagnosis of influenza from Nasopharyngeal swab specimens and should not be used as a sole basis for treatment. Nasal washings and aspirates are unacceptable for Xpert Xpress SARS-CoV-2/FLU/RSV testing.  Fact Sheet for Patients: EntrepreneurPulse.com.au  Fact Sheet for Healthcare Providers: IncredibleEmployment.be  This test is not yet approved or cleared by the [place] FDA and has been authorized for detection and/or diagnosis of SARS-CoV-2 by FDA under an Emergency Use Authorization (EUA). This EUA will remain in effect (meaning this test can be used) for the duration of the COVID-19 declaration under Section 564(b)(1) of the Act, 21 U.S.C. section 360bbb-3(b)(1), unless the authorization is terminated or revoked.  Performed at [facility], [address] [place],  [address]          Radiology Studies: CT Angio Abd/Pel W and/or Wo Contrast  Result Date: 03/28/2020 CLINICAL DATA:  Chronic mesenteric ischemia. Right-sided abdominal pain. EXAM: CTA ABDOMEN AND PELVIS WITHOUT AND WITH CONTRAST TECHNIQUE: Multidetector CT imaging of the abdomen and pelvis was performed using the standard protocol during bolus administration of intravenous contrast. Multiplanar reconstructed images and MIPs were obtained and reviewed to evaluate the vascular anatomy. CONTRAST:  2m OMNIPAQUE IOHEXOL 350 MG/ML SOLN COMPARISON:  None. FINDINGS: VASCULAR Aorta: Aortic course and caliber are normal. There is calcific aortic atherosclerosis. Focal dissection of the infrarenal aorta is unchanged (4:81). Celiac: Patent without evidence of aneurysm, dissection, vasculitis or significant stenosis. SMA: Patent without evidence of aneurysm, dissection, vasculitis or significant stenosis. Renals: Calcification causes unchanged stenosis of the  proximal left renal artery. Right renal artery is normal. IMA: Patent with mild narrowing at the origin. Inflow: Severe stenosis of the left internal iliac artery due to mixed density atherosclerosis, unchanged. Otherwise unremarkable. Proximal Outflow: Bilateral common femoral and visualized portions of the superficial and profunda femoral arteries are patent without evidence of aneurysm, dissection, vasculitis or significant stenosis. Veins: The main portal vein, superior mesenteric vein and splenic vein are patent. The IVC is patent. Review of the MIP images confirms the above findings. NON-VASCULAR Lower chest: No acute abnormality. Hepatobiliary: No focal liver abnormality is seen. No gallstones, gallbladder wall thickening, or biliary dilatation. Pancreas: Unremarkable. No pancreatic ductal dilatation or surrounding inflammatory changes. Spleen: Normal in size without focal abnormality. Adrenals/Urinary Tract:  Unchanged appearance of the kidneys with bilateral nephrocalcinosis. No hydronephrosis. Urinary bladder is distended. Stomach/Bowel: Small hiatal hernia. No bowel wall thickening. No pneumatosis. Lymphatic: No lymphadenopathy. Reproductive: Normal uterus.  No adnexal mass. Other: None Musculoskeletal: Lumbar levoscoliosis.  No acute abnormality. IMPRESSION: 1. No evidence of acute mesenteric ischemia. 2. Unchanged appearance of focal dissection of the infrarenal aorta. 3. Unchanged severe stenosis of the left internal iliac artery. 4. Unchanged stenosis of the proximal left renal artery. Aortic Atherosclerosis (ICD10-I70.0). Electronically Signed   By: Ulyses Jarred M.D.   On: 03/28/2020 01:53   US ABDOMEN LIMITED RUQ (LIVER/GB)  Result Date: 03/28/2020 CLINICAL DATA:  Intractable abdominal pain for several weeks. EXAM: ULTRASOUND ABDOMEN LIMITED RIGHT UPPER QUADRANT COMPARISON:  None. FINDINGS: Gallbladder: No gallstones or wall thickening visualized. No sonographic Murphy sign noted by sonographer.  Common bile duct: Diameter: 3 mm Liver: Increased echogenicity with no focal mass. Portal vein is patent on color Doppler imaging with normal direction of blood flow towards the liver. Other: None. IMPRESSION: 1. No cause for pain identified. Electronically Signed   By: Dorise Bullion III M.D   On: 03/28/2020 10:34        Scheduled Meds: . apixaban  2.5 mg Oral BID  . busPIRone  5 mg Oral TID  . hydrALAZINE  25 mg Oral TID  . HYDROcodone-acetaminophen  1 tablet Oral Q6H  . isosorbide mononitrate  15 mg Oral Daily  . levothyroxine  100 mcg Oral Daily  . pantoprazole  40 mg Oral BID AC  . rosuvastatin  10 mg Oral Daily    LOS: 1 day    Time spent: 35 minutes    Kathren Scearce Darleen Crocker, DO [facility]  If 7PM-7AM, please contact night-coverage [email or web address] 03/29/2020, 12:36 PM

## 2020-03-29 NOTE — Progress Notes (Addendum)
Initial Nutrition Assessment  DOCUMENTATION CODES:   Non-severe (moderate) malnutrition in context of chronic illness  INTERVENTION:   When diet is advanced:  Ensure Enlive po TID, each supplement provides 350 kcal and 20 grams of protein   Please provide assistance with tray set up and feeding all meals   NUTRITION DIAGNOSIS:   Moderate Malnutrition related to altered GI function (GI consulted- possible colonoscopy. Abdominal pain, hx of IBS.) as evidenced by mild fat depletion,moderate muscle depletion,mild muscle depletion,moderate fat depletion, poor oral intake (bites of meals prior to admission and 0% x 1 day).  GOAL:  Patient will meet greater than or equal to 90% of their needs  MONITOR:  Labs,PO intake,Diet advancement,Supplement acceptance,Weight trends  REASON FOR ASSESSMENT:   Malnutrition Screening Tool    ASSESSMENT: Patient is an 87 yo female with hx of HTN, HLD, GERD, CAD, CKD-3, IBS and COVID-19 (December 2020). She presents with complaint of upper abdominal pain. GI consulted. Possible colonoscopy.   Patient has caregiver at bedside. She has been helping patient Monday - Friday and the family covers care on the weekends. Patient receives 3 meals daily but eats very little (a few bites) according to patient/caregiver recall. She likes Ensure High Protein and has been consuming 2 daily at home.   Complains that she is not able to feed herself due to hands shaking. Taste changes reported. Vision impaired-receiving treatment- macular degeneration.   Meal intake 0% per chart review.   Weight has been ranging between 59-61 kg  ~ 3 months. Currently 58.5 kg. Patient reports usual weight of 150 lb.  Labs reviewed.  Medications: buspar, Norco, Crestor, Protonix, Synthroid  NUTRITION - FOCUSED PHYSICAL EXAM:  Flowsheet Row Most Recent Value  Orbital Region Moderate depletion  Upper Arm Region Moderate depletion  Thoracic and Lumbar Region Severe depletion  Buccal  Region Mild depletion  Temple Region Moderate depletion  Clavicle Bone Region Severe depletion  Clavicle and Acromion Bone Region Moderate depletion  Dorsal Hand Mild depletion  Edema (RD Assessment) None  Hair Unable to assess  [covered]  Eyes Reviewed  Mouth Reviewed  Skin Reviewed  Nails Reviewed      Diet Order:   Diet Order            Diet clear liquid Room service appropriate? Yes; Fluid consistency: Thin  Diet effective now                 EDUCATION NEEDS:  Education needs have been addressed  Skin:  Skin Assessment: Reviewed RN Assessment  Last BM:  unknown  Height:   Ht Readings from Last 1 Encounters:  03/27/20 '5\' 3"'$  (1.6 m)    Weight:   Wt Readings from Last 1 Encounters:  03/28/20 58.5 kg    Ideal Body Weight:   50 kg   BMI:  Body mass index is 22.85 kg/m.  Estimated Nutritional Needs:   Kcal:  1500-1700  Protein:  75-80 gr  Fluid:  >1500 ml daily   Colman Cater MS,RD,CSG,LDN Pager: Shea Evans

## 2020-03-29 NOTE — Progress Notes (Cosign Needed Addendum)
Subjective: Pain slightly improved but still notes pain after eating, stating her stomach "balls up". Feels pressure in her rectum when eating. Pain reported as upper abdomen but is more tender on left side. No improvement with bowel movements. Drinks Miralax and prune juice daily. No overt GI bleeding.   Took antibiotics for Cdiff in Jan 2022 (vanc 125 mg po QID for 14 days). No diarrhea currently.   Objective: Vital signs in last 24 hours: Temp:  [98.2 F (36.8 C)-98.7 F (37.1 C)] 98.2 F (36.8 C) (03/14 0543) Pulse Rate:  [65-83] 65 (03/14 0543) Resp:  [16] 16 (03/14 0543) BP: (101-148)/(51-71) 148/62 (03/14 0543) SpO2:  [94 %-96 %] 96 % (03/14 0543) Last BM Date:  (unsure of last BM) General:   Alert and oriented, pleasant Head:  Normocephalic and atraumatic. Eyes:  No icterus, sclera clear. Conjuctiva pink.  Abdomen:  Bowel sounds present, soft, TTP LLQ predominantly, also TTP epigastric, no guarding Neurologic:  Alert and  oriented x4 Psych:  Alert and cooperative. Normal mood and affect.  Intake/Output from previous day: 03/13 0701 - 03/14 0700 In: 312.3 [P.O.:240; IV Piggyback:72.3] Out: -  Intake/Output this shift: Total I/O In: 360 [P.O.:360] Out: -   Lab Results: Recent Labs    03/27/20 2349 03/29/20 0551  WBC 5.0 5.2  HGB 11.5* 10.2*  HCT 35.0* 33.0*  PLT 318 289   BMET Recent Labs    03/27/20 2349 03/29/20 0551  NA 132* 137  K 3.2* 4.8  CL 97* 105  CO2 23 24  GLUCOSE 98 91  BUN 18 12  CREATININE 1.17* 1.08*  CALCIUM 8.8* 8.8*   LFT Recent Labs    03/27/20 2349 03/29/20 0551  PROT 6.6 6.0*  ALBUMIN 3.2* 2.8*  AST 38 39  ALT 34 29  ALKPHOS 68 58  BILITOT 0.7 0.5   PT/INR Recent Labs    03/29/20 0551  LABPROT 16.8*  INR 1.4*     Studies/Results: CT Angio Abd/Pel W and/or Wo Contrast  Result Date: 03/28/2020 CLINICAL DATA:  Chronic mesenteric ischemia. Right-sided abdominal pain. EXAM: CTA ABDOMEN AND PELVIS WITHOUT AND  WITH CONTRAST TECHNIQUE: Multidetector CT imaging of the abdomen and pelvis was performed using the standard protocol during bolus administration of intravenous contrast. Multiplanar reconstructed images and MIPs were obtained and reviewed to evaluate the vascular anatomy. CONTRAST:  62m OMNIPAQUE IOHEXOL 350 MG/ML SOLN COMPARISON:  None. FINDINGS: VASCULAR Aorta: Aortic course and caliber are normal. There is calcific aortic atherosclerosis. Focal dissection of the infrarenal aorta is unchanged (4:81). Celiac: Patent without evidence of aneurysm, dissection, vasculitis or significant stenosis. SMA: Patent without evidence of aneurysm, dissection, vasculitis or significant stenosis. Renals: Calcification causes unchanged stenosis of the proximal left renal artery. Right renal artery is normal. IMA: Patent with mild narrowing at the origin. Inflow: Severe stenosis of the left internal iliac artery due to mixed density atherosclerosis, unchanged. Otherwise unremarkable. Proximal Outflow: Bilateral common femoral and visualized portions of the superficial and profunda femoral arteries are patent without evidence of aneurysm, dissection, vasculitis or significant stenosis. Veins: The main portal vein, superior mesenteric vein and splenic vein are patent. The IVC is patent. Review of the MIP images confirms the above findings. NON-VASCULAR Lower chest: No acute abnormality. Hepatobiliary: No focal liver abnormality is seen. No gallstones, gallbladder wall thickening, or biliary dilatation. Pancreas: Unremarkable. No pancreatic ductal dilatation or surrounding inflammatory changes. Spleen: Normal in size without focal abnormality. Adrenals/Urinary Tract: Unchanged appearance of the kidneys with bilateral  nephrocalcinosis. No hydronephrosis. Urinary bladder is distended. Stomach/Bowel: Small hiatal hernia. No bowel wall thickening. No pneumatosis. Lymphatic: No lymphadenopathy. Reproductive: Normal uterus.  No adnexal  mass. Other: None Musculoskeletal: Lumbar levoscoliosis.  No acute abnormality. IMPRESSION: 1. No evidence of acute mesenteric ischemia. 2. Unchanged appearance of focal dissection of the infrarenal aorta. 3. Unchanged severe stenosis of the left internal iliac artery. 4. Unchanged stenosis of the proximal left renal artery. Aortic Atherosclerosis (ICD10-I70.0). Electronically Signed   By: Ulyses Jarred M.D.   On: 03/28/2020 01:53   US ABDOMEN LIMITED RUQ (LIVER/GB)  Result Date: 03/28/2020 CLINICAL DATA:  Intractable abdominal pain for several weeks. EXAM: ULTRASOUND ABDOMEN LIMITED RIGHT UPPER QUADRANT COMPARISON:  None. FINDINGS: Gallbladder: No gallstones or wall thickening visualized. No sonographic Murphy sign noted by sonographer. Common bile duct: Diameter: 3 mm Liver: Increased echogenicity with no focal mass. Portal vein is patent on color Doppler imaging with normal direction of blood flow towards the liver. Other: None. IMPRESSION: 1. No cause for pain identified. Electronically Signed   By: Dorise Bullion III M.D   On: 03/28/2020 10:34    Assessment: 87 year old female admitted with acute on chronic abdominal pain, with prior extensive evaluations to include EGD Nov 2021, CTA Nov 2021 negative for chronic mesenteric ischemia, [place] with fatty liver and no gallstones, on Elavil and Bentyl in past but unable to continue on Elavil due to prolonged QTC. Last colonoscopy 2012.   During this hospital presentation, repeat CTA without evidence for acute mesenteric ischemia. Other findings as noted above unchanged. US abdomen without gallstones. Buspar 5 mg TID started yesterday, along with Levsin every 6 hours prn.   Cortisol level resulted today and low at 5.3.   Normocytic anemia: chronic dating back at least a year. No recent iron studies, with last on file in July 2021. Likely of chronic disease. Will update iron anemia panel.   Clinically, she has had just marginal improvement and continues  to report post-prandial abdominal pain all across upper abdomen but is predominantly tender on exam left-sided abdomen and LLQ. Interestingly, also notes rectal pressure when eating that corresponds with abdominal pain. Unclear etiology of pain and would benefit from endocrine evaluation due to lower cortisol level. May need to consider colonoscopy for completeness' sake, as last was in 2012.    Plan: Resume PPI daily while inpatient Continue Levsin prn Continue Buspar TID Check anemia panel Consider colonoscopy Agree with trial of full liquids Further recommendations to follow   Annitta Needs, PhD, ANP-BC [facility] Gastroenterology       LOS: 1 day    03/29/2020, 10:40 AM

## 2020-03-29 NOTE — Progress Notes (Signed)
Spoke with patient's son and reviewed risks and benefits of colonoscopy. He is agreeable to pursue this. I also spoke with granddaughter, [place].

## 2020-03-30 ENCOUNTER — Inpatient Hospital Stay (HOSPITAL_COMMUNITY): Payer: Medicare Other | Admitting: Anesthesiology

## 2020-03-30 ENCOUNTER — Encounter (HOSPITAL_COMMUNITY)
Admission: EM | Disposition: A | Discharge status: Skilled Nursing Facility | Payer: Self-pay | Source: Home / Self Care | Attending: Family Medicine

## 2020-03-30 ENCOUNTER — Encounter (HOSPITAL_COMMUNITY): Payer: Self-pay | Admitting: Internal Medicine

## 2020-03-30 DIAGNOSIS — K6289 Other specified diseases of anus and rectum: Secondary | ICD-10-CM

## 2020-03-30 DIAGNOSIS — K5730 Diverticulosis of large intestine without perforation or abscess without bleeding: Secondary | ICD-10-CM

## 2020-03-30 DIAGNOSIS — R109 Unspecified abdominal pain: Secondary | ICD-10-CM | POA: Diagnosis not present

## 2020-03-30 HISTORY — PX: COLONOSCOPY WITH PROPOFOL: SHX5780

## 2020-03-30 LAB — BASIC METABOLIC PANEL
Anion gap: 9 (ref 5–15)
BUN: 10 mg/dL (ref 8–23)
CO2: 22 mmol/L (ref 22–32)
Calcium: 8.7 mg/dL — ABNORMAL LOW (ref 8.9–10.3)
Chloride: 106 mmol/L (ref 98–111)
Creatinine, Ser: 1.04 mg/dL — ABNORMAL HIGH (ref 0.44–1.00)
GFR, Estimated: 52 mL/min — ABNORMAL LOW (ref >60)
Glucose, Bld: 78 mg/dL (ref 70–99)
Potassium: 4.1 mmol/L (ref 3.5–5.1)
Sodium: 137 mmol/L (ref 135–145)

## 2020-03-30 LAB — CBC
HCT: 35 % — ABNORMAL LOW (ref 36.0–46.0)
Hemoglobin: 11 g/dL — ABNORMAL LOW (ref 12.0–15.0)
MCH: 30.4 pg (ref 26.0–34.0)
MCHC: 31.4 g/dL (ref 30.0–36.0)
MCV: 96.7 fL (ref 80.0–100.0)
Platelets: 277 10*3/uL (ref 150–400)
RBC: 3.62 MIL/uL — ABNORMAL LOW (ref 3.87–5.11)
RDW: 14.4 % (ref 11.5–15.5)
WBC: 4.3 10*3/uL (ref 4.0–10.5)
nRBC: 0 % (ref 0.0–0.2)

## 2020-03-30 LAB — MAGNESIUM: Magnesium: 2.1 mg/dL (ref 1.7–2.4)

## 2020-03-30 SURGERY — COLONOSCOPY WITH PROPOFOL
Anesthesia: General

## 2020-03-30 MED ORDER — PROPOFOL 10 MG/ML IV BOLUS
INTRAVENOUS | Status: DC | PRN
  Administered 2020-03-30 (×3): 50 mg via INTRAVENOUS

## 2020-03-30 MED ORDER — LACTATED RINGERS IV SOLN: INTRAVENOUS | Status: DC

## 2020-03-30 MED ORDER — LIDOCAINE HCL (CARDIAC) PF 100 MG/5ML IV SOSY
PREFILLED_SYRINGE | INTRAVENOUS | Status: DC | PRN
  Administered 2020-03-30: 40 mg via INTRATRACHEAL

## 2020-03-30 MED ORDER — SODIUM CHLORIDE 0.9 % IV SOLN: INTRAVENOUS | Status: DC

## 2020-03-30 NOTE — Progress Notes (Signed)
We will proceed with colonoscopy as scheduled.  I thoroughly discussed with the patient his procedure, including the risks involved. Patient understands what the procedure involves including the benefits and any risks. Patient understands alternatives to the proposed procedure. Risks including (but not limited to) bleeding, tearing of the lining (perforation), rupture of adjacent organs, problems with heart and lung function, infection, and medication reactions. A small percentage of complications may require surgery, hospitalization, repeat endoscopic procedure, and/or transfusion.  Patient understood and agreed.  Daniel Castaneda, MD Gastroenterology and Hepatology [facility]  

## 2020-03-30 NOTE — Anesthesia Preprocedure Evaluation (Addendum)
Anesthesia Evaluation  Patient identified by MRN, date of birth, ID band Patient awake    Reviewed: Allergy & Precautions, NPO status , Patient's Chart, lab work & pertinent test results  History of Anesthesia Complications Negative for: history of anesthetic complications  Airway Mallampati: II  TM Distance: >3 FB Neck ROM: Full    Dental  (+) Edentulous Upper, Edentulous Lower   Pulmonary shortness of breath and with exertion,    Pulmonary exam normal breath sounds clear to auscultation       Cardiovascular Exercise Tolerance: Poor hypertension, + angina + CAD, + Past MI, + Cardiac Stents, + Peripheral Vascular Disease and + DVT  + Cardiac Defibrillator  Rhythm:Regular Rate:Normal + Systolic murmurs 0000000 06:40:09 [place] System-AP-300 ROUTINE RECORD Poor data quality, interpretation may be adversely affected Sinus rhythm with 1st degree A-V block Left axis deviation Non-specific intra-ventricular conduction block Left ventricular hypertrophy with repolarization abnormality ( R in aVL , Cornell product ) Inferior infarct , age undetermined Abnormal ECG 72m/s 145mmV '100Hz'$  9.0.4 12SL 243 CID: [id number]  1. Left ventricular ejection fraction, by estimation, is 50 to 55%. The  left ventricle has normal function. The left ventricle has no regional  wall motion abnormalities. There is mild left ventricular hypertrophy.  Left ventricular diastolic parameters  are consistent with Grade I diastolic dysfunction (impaired relaxation).  2. Right ventricular systolic function is normal. The right ventricular  size is normal. There is mildly elevated pulmonary artery systolic  pressure.  3. Left atrial size was severely dilated.  4. Right atrial size was mildly dilated.  5. The mitral valve is normal in structure. Trivial mitral valve  regurgitation. No evidence of mitral stenosis.  6. Tricuspid valve  regurgitation is mild to moderate.  7. The aortic valve is tricuspid. Aortic valve regurgitation is mild.  8. The inferior vena cava is normal in size with greater than 50%  respiratory variability, suggesting right atrial pressure of 3 mmHg.    Neuro/Psych Anxiety negative neurological ROS     GI/Hepatic Neg liver ROS, hiatal hernia, GERD  Medicated,  Endo/Other  diabetes, Type 2Hypothyroidism   Renal/GU Renal InsufficiencyRenal disease     Musculoskeletal  (+) Arthritis ,   Abdominal   Peds  Hematology  (+) anemia ,   Anesthesia Other Findings   Reproductive/Obstetrics                           Anesthesia Physical Anesthesia Plan  ASA: III  Anesthesia Plan: General   Post-op Pain Management:    Induction: Intravenous  PONV Risk Score and Plan: Propofol infusion  Airway Management Planned: Nasal Cannula and Natural Airway  Additional Equipment:   Intra-op Plan:   Post-operative Plan:   Informed Consent: I have reviewed the patients History and Physical, chart, labs and discussed the procedure including the risks, benefits and alternatives for the proposed anesthesia with the patient or authorized representative who has indicated his/her understanding and acceptance.    Discussed DNR with patient and Suspend DNR.     Plan Discussed with: CRNA and Surgeon  Anesthesia Plan Comments:         Anesthesia Quick Evaluation

## 2020-03-30 NOTE — Brief Op Note (Addendum)
03/27/2020 - 03/30/2020  12:09 PM  PATIENT:  [name]  87 y.o. female  PRE-OPERATIVE DIAGNOSIS:  abdominal pain, weight loss  POST-OPERATIVE DIAGNOSIS:  diverticulosis; sigmoid polyp not removed  PROCEDURE:  Procedure(s): COLONOSCOPY WITH PROPOFOL (N/A)  SURGEON:  Surgeon(s) and Role:    * Harvel Quale, MD - Primary  Patient underwent colonoscopy under propofol today.  Tolerated the procedure well.  Prep was adequate.  Patient was found to have diverticulosis in the sigmoid colon.  Also found to have a 6 mm carpet-like polyp in the sigmoid colon which was not resected as the patient is on Eliquis.  No other alterations were seen, retroflexion was normal.  RECOMMENDATIONS: - Return patient to hospital ward for ongoing care.  - Resume previous diet.  - Continue Buspar and Levsin PRN - If patient presents recurrent abdominal pain, will need to consider stopping BuSpar and starting medications for IBS-C such as Linzess or Amitiza as the patient had improvement after having the bowel prep today.  Maylon Peppers, MD Gastroenterology and Hepatology [facility]

## 2020-03-30 NOTE — Op Note (Addendum)
[facility] Patient Name: [name] Procedure Date: 03/30/2020 11:21 AM MRN: [id number] Date of Birth: Jul 20, 1931 Attending MD: Maylon Peppers ,  CSN: [id number] Age: 87 Admit Type: Inpatient Procedure:                Colonoscopy Indications:              Epigastric abdominal pain, Rectal pain Providers:                Maylon Peppers, Gwenlyn Fudge, RN, Kristine L.                            Risa Grill, Technician Referring MD:              Medicines:                Monitored Anesthesia Care Complications:            No immediate complications. Estimated Blood Loss:     Estimated blood loss: none. Procedure:                Pre-Anesthesia Assessment:                           - Prior to the procedure, a History and Physical                            was performed, and patient medications, allergies                            and sensitivities were reviewed. The patient's                            tolerance of previous anesthesia was reviewed.                           - The risks and benefits of the procedure and the                            sedation options and risks were discussed with the                            patient. All questions were answered and informed                            consent was obtained.                           - ASA Grade Assessment: III - A patient with severe                            systemic disease.                           After obtaining informed consent, the colonoscope                            was passed under direct vision. Throughout the  procedure, the patient's blood pressure, pulse, and                            oxygen saturations were monitored continuously. The                            PCF-[id number] scope was introduced through                            the anus and advanced to the the cecum, identified                            by appendiceal orifice and ileocecal valve. The                             colonoscopy was performed without difficulty. The                            patient tolerated the procedure well. The quality                            of the bowel preparation was adequate. Scope                            withdrawal time was 12 minutes. Scope In: 11:35:08 AM Scope Out: 12:04:24 PM Scope Withdrawal Time: 0 hours 13 minutes 47 seconds  Total Procedure Duration: 0 hours 29 minutes 16 seconds  Findings:      The perianal and digital rectal examinations were normal.      A few small and large-mouthed diverticula were found in the sigmoid       colon and descending colon.      A 6 mm polyp was found in the sigmoid colon. The polyp was carpet-like.       The polyp was not removed as the patient is on Eliquis.      The retroflexed view of the distal rectum and anal verge was normal and       showed no anal or rectal abnormalities. Impression:               - Diverticulosis in the sigmoid colon and in the                            descending colon.                           - One 6 mm polyp in the sigmoid colon, not removed.                           - The distal rectum and anal verge are normal on                            retroflexion view.                           - No specimens collected. Moderate Sedation:  Per Anesthesia Care Recommendation:           - Return patient to hospital ward for ongoing care.                           - Resume previous diet.                           - Repeat colonoscopy is not recommended due to                            current age (82 years or older) for screening                            purposes.                           - Continue Buspar and Levsin PRN                           - If patient presents recurrent abdominal pain,                            will need to consider stopping BuSpar and starting                            medications for IBS-C such as Linzess or Amitiza as                             the patient had improvement after having the bowel                            prep today. Procedure Code(s):        --- Professional ---                           [phone], Colonoscopy, flexible; diagnostic, including                            collection of specimen(s) by brushing or washing,                            when performed (separate procedure) Diagnosis Code(s):        --- Professional ---                           K63.5, Polyp of colon                           R10.13, Epigastric pain                           K62.89, Other specified diseases of anus and rectum                           K57.30, Diverticulosis of large intestine without  perforation or abscess without bleeding CPT copyright 2019 American Medical Association. All rights reserved. The codes documented in this report are preliminary and upon coder review may  be revised to meet current compliance requirements. Maylon Peppers, MD Maylon Peppers,  03/30/2020 12:09:32 PM This report has been signed electronically. Number of Addenda: 0

## 2020-03-30 NOTE — Progress Notes (Signed)
PROGRESS NOTE    [name]  [id number] DOB: May 10, 1931 DOA: 03/27/2020 PCP: [name], DO   Brief Narrative:   [name] a 87 y.o.femalewith medical history significant foratrial fibrillation on Eliquis, CAD, HTN, HLD,AICD, CKD stage III, IBS, nephrolithiasis, arthritis, history of COVID-19 in 12/2018, and GERD with esophagitis who presented with complaintof several weeks of ongoing upper abdominal pain.Patient has been admitted for abdominal pain evaluation, but no significant findings noted on lab work or imaging is apparent.  GI has performed colonoscopy on 3/15 with noted diverticulosis in the sigmoid colon and polyp that was not removed.  Assessment & Plan:   Principal Problem:   Intractable abdominal pain Active Problems:   CKD (chronic kidney disease), stage III (HCC)   Essential hypertension   Dehydration   Hypokalemia   GERD (gastroesophageal reflux disease)   Atrial fibrillation, chronic (HCC)   Hyperlipidemia   Nausea   Hypothyroidism   Hyponatremia   Prolonged QT interval   Lactic acidosis   Elevated troponin I level   Intractable abdominal pain -Improved and mostly postprandial -Uncertain etiology, but a.m. cortisol noted to be low and patient will require endocrinology follow-up -Continue Levsin and BuSpar per GI -Colonoscopy on 3/15 without any significant findings -Diet advanced to soft today  Normocytic anemia -Anemia panel without significant findings  GERD/nausea -IV Compazine as needed for nausea  Chronic hyponatremia-improved -IV hydration held  Lactic acidosis-resolved -Likely related to dehydration  Hypothyroidism -Continue Synthroid  Chronic atrial fibrillation -Continue Eliquis -Currently in NSR -Amiodarone and metoprolol held due to prolonged QTC  CKDIIIa-stable  Essential hypertension -Continue hydralazine and Imdur  Dyslipidemia -Continue Crestor  Status postAICD   DVT  prophylaxis: Eliquis Code Status: DNR Family Communication: Discussed with granddaughter [place] at bedside on 3/14 Disposition Plan:  Status is: Inpatient  Remains inpatient appropriate because:IV treatments appropriate due to intensity of illness or inability to take PO and Inpatient level of care appropriate due to severity of illness   Dispo: The patient is from: Home  Anticipated d/c is to: Home  Patient currently is not medically stable to d/c.              Difficult to place patient No  Consultants:   GI  Procedures:   See below  Antimicrobials:   None  Subjective: Patient seen and evaluated today with improving abdominal pain with advancing diet.  She has undergone colonoscopy without any significant findings aside from diverticulosis and a polyp that was not removed on account of the fact that she is on a blood thinner.  She appears to be progressing and diet will be advanced to soft today.  Objective: Vitals:   03/30/20 1010 03/30/20 1210 03/30/20 1236 03/30/20 1415  BP: (!) 176/77 138/68 (!) 170/74 (!) 155/62  Pulse:   71 65  Resp: '13 15 16 18  '$ Temp: 98 F (36.7 C) 98.3 F (36.8 C) 97.8 F (36.6 C) 98.8 F (37.1 C)  TempSrc: Oral Oral  Oral  SpO2: 96% 96% 97% 97%  Weight:      Height:        Intake/Output Summary (Last 24 hours) at 03/30/2020 1454 Last data filed at 03/30/2020 1211 Gross per 24 hour  Intake 1020 ml  Output 600 ml  Net 420 ml   Filed Weights   03/27/20 2228 03/28/20 0636  Weight: 61.2 kg 58.5 kg    Examination:  General exam: Appears calm and comfortable, hard of hearing Respiratory system: Clear to auscultation.  Respiratory effort normal. Cardiovascular system: S1 & S2 heard, RRR.  Gastrointestinal system: Abdomen is soft Central nervous system: Alert and awake Extremities: No edema Skin: No significant lesions noted Psychiatry: Flat affect.    Data Reviewed: I have personally reviewed  following labs and imaging studies  CBC: Recent Labs  Lab 03/24/20 2148 03/27/20 2349 03/29/20 0551 03/30/20 0502  WBC 4.9 5.0 5.2 4.3  NEUTROABS 2.5 2.5  --   --   HGB 11.3* 11.5* 10.2* 11.0*  HCT 34.3* 35.0* 33.0* 35.0*  MCV 92.5 92.6 97.1 96.7  PLT 327 318 289 [id number]   Basic Metabolic Panel: Recent Labs  Lab 03/24/20 2148 03/27/20 2349 03/29/20 0551 03/30/20 0502  NA 131* 132* 137 137  K 3.6 3.2* 4.8 4.1  CL 98 97* 105 106  CO2 '24 23 24 22  '$ GLUCOSE 117* 98 91 78  BUN '18 18 12 10  '$ CREATININE 1.05* 1.17* 1.08* 1.04*  CALCIUM 8.9 8.8* 8.8* 8.7*  MG  --   --  1.9 2.1  PHOS  --   --  3.6  --    GFR: Estimated Creatinine Clearance: 32.1 mL/min (A) (by C-G formula based on SCr of 1.04 mg/dL (H)). Liver Function Tests: Recent Labs  Lab 03/24/20 2148 03/27/20 2349 03/29/20 0551  AST 45* 38 39  ALT 45* 34 29  ALKPHOS 69 68 58  BILITOT 0.7 0.7 0.5  PROT 6.5 6.6 6.0*  ALBUMIN 3.1* 3.2* 2.8*   Recent Labs  Lab 03/24/20 2148 03/27/20 2349  LIPASE 33 33   No results for input(s): AMMONIA in the last 168 hours. Coagulation Profile: Recent Labs  Lab 03/29/20 0551  INR 1.4*   Cardiac Enzymes: No results for input(s): CKTOTAL, CKMB, CKMBINDEX, TROPONINI in the last 168 hours. BNP (last 3 results) No results for input(s): PROBNP in the last 8760 hours. HbA1C: No results for input(s): HGBA1C in the last 72 hours. CBG: Recent Labs  Lab 03/28/20 0748 03/28/20 1103 03/28/20 1636 03/28/20 2018  GLUCAP 131* 118* 144* 98   Lipid Profile: No results for input(s): CHOL, HDL, LDLCALC, TRIG, CHOLHDL, LDLDIRECT in the last 72 hours. Thyroid Function Tests: No results for input(s): TSH, T4TOTAL, FREET4, T3FREE, THYROIDAB in the last 72 hours. Anemia Panel: Recent Labs    03/28/20 2344 03/29/20 0554  VITAMINB12 1,049*  --   FOLATE 36.5  --   FERRITIN 64  --   TIBC 309  --   IRON 45  --   RETICCTPCT  --  1.8   Sepsis Labs: Recent Labs  Lab 03/27/20 2349  03/28/20 0156 03/28/20 0821 03/28/20 1036  LATICACIDVEN 2.7* 2.5* 1.3 1.0    Recent Results (from the past 240 hour(s))  Microscopic Examination     Status: Abnormal   Collection Time: 03/24/20  1:12 PM   Urine  Result Value Ref Range Status   WBC, UA 0-5 0 - 5 /hpf Final   RBC 0-2 0 - 2 /hpf Final   Epithelial Cells (non renal) >10 (A) 0 - 10 /hpf Final   Renal Epithel, UA None seen None seen /hpf Final   Bacteria, UA Few None seen/Few Final  Resp Panel by RT-PCR (Flu A&B, Covid) Nasopharyngeal Swab     Status: None   Collection Time: 03/28/20  4:16 AM   Specimen: Nasopharyngeal Swab; Nasopharyngeal(NP) swabs in vial transport medium  Result Value Ref Range Status   SARS Coronavirus 2 by RT PCR NEGATIVE NEGATIVE Final    Comment: (NOTE) SARS-CoV-2  target nucleic acids are NOT DETECTED.  The SARS-CoV-2 RNA is generally detectable in upper respiratory specimens during the acute phase of infection. The lowest concentration of SARS-CoV-2 viral copies this assay can detect is 138 copies/mL. A negative result does not preclude SARS-Cov-2 infection and should not be used as the sole basis for treatment or other patient management decisions. A negative result may occur with  improper specimen collection/handling, submission of specimen other than nasopharyngeal swab, presence of viral mutation(s) within the areas targeted by this assay, and inadequate number of viral copies(<138 copies/mL). A negative result must be combined with clinical observations, patient history, and epidemiological information. The expected result is Negative.  Fact Sheet for Patients:  EntrepreneurPulse.com.au  Fact Sheet for Healthcare Providers:  IncredibleEmployment.be  This test is no t yet approved or cleared by the [place] FDA and  has been authorized for detection and/or diagnosis of SARS-CoV-2 by FDA under an Emergency Use Authorization (EUA). This EUA  will remain  in effect (meaning this test can be used) for the duration of the COVID-19 declaration under Section 564(b)(1) of the Act, 21 U.S.C.section 360bbb-3(b)(1), unless the authorization is terminated  or revoked sooner.       Influenza A by PCR NEGATIVE NEGATIVE Final   Influenza B by PCR NEGATIVE NEGATIVE Final    Comment: (NOTE) The Xpert Xpress SARS-CoV-2/FLU/RSV plus assay is intended as an aid in the diagnosis of influenza from Nasopharyngeal swab specimens and should not be used as a sole basis for treatment. Nasal washings and aspirates are unacceptable for Xpert Xpress SARS-CoV-2/FLU/RSV testing.  Fact Sheet for Patients: EntrepreneurPulse.com.au  Fact Sheet for Healthcare Providers: IncredibleEmployment.be  This test is not yet approved or cleared by the [place] FDA and has been authorized for detection and/or diagnosis of SARS-CoV-2 by FDA under an Emergency Use Authorization (EUA). This EUA will remain in effect (meaning this test can be used) for the duration of the COVID-19 declaration under Section 564(b)(1) of the Act, 21 U.S.C. section 360bbb-3(b)(1), unless the authorization is terminated or revoked.  Performed at [facility], [address] [place], [place] [address]          Radiology Studies: No results found.      Scheduled Meds: . apixaban  2.5 mg Oral BID  . busPIRone  5 mg Oral TID  . hydrALAZINE  25 mg Oral TID  . HYDROcodone-acetaminophen  1 tablet Oral Q6H  . isosorbide mononitrate  15 mg Oral Daily  . levothyroxine  100 mcg Oral Daily  . pantoprazole  40 mg Oral BID AC  . rosuvastatin  10 mg Oral Daily    LOS: 2 days    Time spent: 35 minutes    Tera Pellicane Darleen Crocker, DO [facility]  If 7PM-7AM, please contact night-coverage [email or web address] 03/30/2020, 2:55 PM

## 2020-03-30 NOTE — Transfer of Care (Signed)
Immediate Anesthesia Transfer of Care Note  Patient: [name]  Procedure(s) Performed: COLONOSCOPY WITH PROPOFOL (N/A )  Patient Location: Endoscopy Unit  Anesthesia Type:General  Level of Consciousness: awake, alert , oriented and patient cooperative  Airway & Oxygen Therapy: Patient Spontanous Breathing  Post-op Assessment: Report given to RN and Post -op Vital signs reviewed and stable  Post vital signs: Reviewed and stable  Last Vitals:  Vitals Value Taken Time  BP    Temp    Pulse    Resp    SpO2      Last Pain:  Vitals:   03/30/20 1100  TempSrc:   PainSc: 0-No pain         Complications: No complications documented.

## 2020-03-30 NOTE — Anesthesia Postprocedure Evaluation (Signed)
Anesthesia Post Note  Patient: [name]  Procedure(s) Performed: COLONOSCOPY WITH PROPOFOL (N/A )  Patient location during evaluation: Endoscopy Anesthesia Type: General Level of consciousness: awake and alert and patient cooperative Pain management: pain level controlled Vital Signs Assessment: post-procedure vital signs reviewed and stable Respiratory status: spontaneous breathing, nonlabored ventilation and respiratory function stable Cardiovascular status: blood pressure returned to baseline and stable Postop Assessment: no apparent nausea or vomiting Anesthetic complications: no   No complications documented.   Last Vitals:  Vitals:   03/30/20 0516 03/30/20 1010  BP: (!) 156/77 (!) 176/77  Pulse: 75   Resp: 17 13  Temp: 36.8 C 36.7 C  SpO2: 93% 96%    Last Pain:  Vitals:   03/30/20 1100  TempSrc:   PainSc: 0-No pain                 Barrie Wale

## 2020-03-31 ENCOUNTER — Ambulatory Visit: Payer: PRIVATE HEALTH INSURANCE | Admitting: Urology

## 2020-03-31 DIAGNOSIS — I482 Chronic atrial fibrillation, unspecified: Secondary | ICD-10-CM | POA: Diagnosis not present

## 2020-03-31 DIAGNOSIS — R109 Unspecified abdominal pain: Secondary | ICD-10-CM | POA: Diagnosis not present

## 2020-03-31 DIAGNOSIS — N1831 Chronic kidney disease, stage 3a: Secondary | ICD-10-CM | POA: Diagnosis not present

## 2020-03-31 DIAGNOSIS — E860 Dehydration: Secondary | ICD-10-CM | POA: Diagnosis not present

## 2020-03-31 LAB — CBC
HCT: 35.7 % — ABNORMAL LOW (ref 36.0–46.0)
Hemoglobin: 11.3 g/dL — ABNORMAL LOW (ref 12.0–15.0)
MCH: 30.9 pg (ref 26.0–34.0)
MCHC: 31.7 g/dL (ref 30.0–36.0)
MCV: 97.5 fL (ref 80.0–100.0)
Platelets: 266 10*3/uL (ref 150–400)
RBC: 3.66 MIL/uL — ABNORMAL LOW (ref 3.87–5.11)
RDW: 14.6 % (ref 11.5–15.5)
WBC: 5.2 10*3/uL (ref 4.0–10.5)
nRBC: 0 % (ref 0.0–0.2)

## 2020-03-31 LAB — BASIC METABOLIC PANEL
Anion gap: 10 (ref 5–15)
BUN: 10 mg/dL (ref 8–23)
CO2: 21 mmol/L — ABNORMAL LOW (ref 22–32)
Calcium: 8.7 mg/dL — ABNORMAL LOW (ref 8.9–10.3)
Chloride: 105 mmol/L (ref 98–111)
Creatinine, Ser: 1.07 mg/dL — ABNORMAL HIGH (ref 0.44–1.00)
GFR, Estimated: 51 mL/min — ABNORMAL LOW (ref >60)
Glucose, Bld: 77 mg/dL (ref 70–99)
Potassium: 4.1 mmol/L (ref 3.5–5.1)
Sodium: 136 mmol/L (ref 135–145)

## 2020-03-31 LAB — MAGNESIUM: Magnesium: 2 mg/dL (ref 1.7–2.4)

## 2020-03-31 MED ORDER — HYDROMORPHONE HCL 1 MG/ML IJ SOLN
0.2500 mg | INTRAMUSCULAR | Status: DC | PRN
Start: 2020-03-31 — End: 2020-04-01
  Administered 2020-03-31 – 2020-04-01 (×6): 0.25 mg via INTRAVENOUS
  Filled 2020-03-31 (×6): qty 0.5

## 2020-03-31 MED ORDER — MECLIZINE HCL 12.5 MG PO TABS
25.0000 mg | ORAL_TABLET | Freq: Three times a day (TID) | ORAL | Status: DC | PRN
  Administered 2020-04-02: 25 mg via ORAL
  Filled 2020-03-31: qty 2

## 2020-03-31 MED ORDER — METOPROLOL TARTRATE 25 MG PO TABS
25.0000 mg | ORAL_TABLET | Freq: Two times a day (BID) | ORAL | Status: DC
  Administered 2020-03-31 – 2020-04-01 (×4): 25 mg via ORAL
  Filled 2020-03-31 (×4): qty 1

## 2020-03-31 MED ORDER — ISOSORBIDE MONONITRATE ER 60 MG PO TB24
30.0000 mg | ORAL_TABLET | Freq: Every day | ORAL | Status: DC
  Administered 2020-04-01 – 2020-04-06 (×6): 30 mg via ORAL
  Filled 2020-03-31 (×4): qty 1

## 2020-03-31 MED ORDER — TAMSULOSIN HCL 0.4 MG PO CAPS
0.4000 mg | ORAL_CAPSULE | Freq: Every day | ORAL | Status: DC
Start: 2020-03-31 — End: 2020-04-06
  Administered 2020-03-31 – 2020-04-05 (×6): 0.4 mg via ORAL
  Filled 2020-03-31 (×6): qty 1

## 2020-03-31 MED ORDER — ENSURE ENLIVE PO LIQD
237.0000 mL | Freq: Two times a day (BID) | ORAL | Status: DC
  Administered 2020-03-31: 237 mL via ORAL

## 2020-03-31 MED ORDER — VITAMIN D 25 MCG (1000 UNIT) PO TABS
2000.0000 [IU] | ORAL_TABLET | Freq: Every day | ORAL | Status: DC
  Administered 2020-03-31 – 2020-04-06 (×7): 2000 [IU] via ORAL
  Filled 2020-03-31 (×7): qty 2

## 2020-03-31 MED ORDER — ADULT MULTIVITAMIN W/MINERALS CH
1.0000 | ORAL_TABLET | Freq: Every day | ORAL | Status: DC
  Administered 2020-03-31 – 2020-04-06 (×7): 1 via ORAL
  Filled 2020-03-31 (×7): qty 1

## 2020-03-31 MED ORDER — AMIODARONE HCL 200 MG PO TABS
200.0000 mg | ORAL_TABLET | Freq: Every day | ORAL | Status: DC
  Administered 2020-04-01: 200 mg via ORAL
  Filled 2020-03-31: qty 1

## 2020-03-31 NOTE — TOC Progression Note (Signed)
Transition of Care [facility]) - Progression Note    Patient Details  Name: LOUCINDA WORKU MRN: [id number] Date of Birth: 1931-06-14  [facility] of [facility]) CM/SW Contact  Ihor Gully, LCSW Phone Number: 03/31/2020, 5:07 PM  Clinical Narrative:    Patient from home with caregivers for eight hours a day. Children take over after caregiver leaves. Ambulates with walker, has [facility], [place]. PT recommends SNF. Patient declines SNF and wants HH.  Message received from son, [name], multiple attempts to call back however busy signal was received.    Expected Discharge Plan: [place] Barriers to Discharge: Continued Medical Work up  Expected Discharge Plan and Services Expected Discharge Plan: [place] Choice: NA Living arrangements for the past 2 months: Single Family Home                 DME Arranged: N/A DME Agency: NA         HH Agency: NA         Social Determinants of Health (SDOH) Interventions    Readmission Risk Interventions Readmission Risk Prevention Plan 03/28/2020 11/17/2019  Transportation Screening Complete Complete  Medication Review Press photographer) Complete Complete  PCP or Specialist appointment within 3-5 days of discharge - Complete  HRI or [place] Complete Complete  SW Recovery Care/Counseling Consult Complete Complete  Palliative Care Screening Not Applicable Not [place] Not Applicable Not Applicable  Some recent data might be hidden

## 2020-03-31 NOTE — Care Management Important Message (Signed)
Important Message  Patient Details  Name: [name] MRN: [id number] Date of Birth: [birth year]   Medicare Important Message Given:  Yes     Tommy Medal 03/31/2020, 12:27 PM

## 2020-03-31 NOTE — Progress Notes (Signed)
Spoke with nuclear med on phone, they confirm study to be done tomorrow morning; per nuclear med pt must be NPO after midnight and have no pain meds after midnight as well.

## 2020-03-31 NOTE — Plan of Care (Signed)
  Problem: Acute Rehab PT Goals(only PT should resolve) Goal: Pt Will Go Supine/Side To Sit Outcome: Progressing Flowsheets (Taken 03/31/2020 1421) Pt will go Supine/Side to Sit:  with modified independence  with supervision Goal: Patient Will Transfer Sit To/From Stand Outcome: Progressing Flowsheets (Taken 03/31/2020 1421) Patient will transfer sit to/from stand:  with supervision  with min guard assist Goal: Pt Will Transfer Bed To Chair/Chair To Bed Outcome: Progressing Flowsheets (Taken 03/31/2020 1421) Pt will Transfer Bed to Chair/Chair to Bed:  min guard assist  with min assist Goal: Pt Will Ambulate Outcome: Progressing Flowsheets (Taken 03/31/2020 1421) Pt will Ambulate:  50 feet  with min guard assist  with rolling walker   2:21 PM, 03/31/20 Lonell Grandchild, MPT Physical Therapist with [facility] [phone] office [phone] mobile phone

## 2020-03-31 NOTE — Progress Notes (Addendum)
Appropriate Use Committee Chart Review  Chart reviewed by the physician advisor with input from [facility] and the attending physician as needed for review of the appropriateness for SNF referral.  TOC notes, PT/OT/ST notes, nursing notes and physician notes reviewed for medical necessity to determine if the patient's needs are appropriate for short-term rehab to return to a prior level of function versus the likely need for custodial care.  At this time, the patient meets Medicare criteria for SNF placement. It does not appear that she has been to SNF recently. Currently only walking 25f with the use of a rolling walker. Patient appears to have good family support, however is alone at night. She would benefit from SNF placement to increase her strength and ability to perform ADLs.    Recommendations: The patient is SNF appropriate for short-term rehab/skilled nursing interventions.   A consult to the Transitions of Care Team has been made to facilitate placement.    MCristal Ford DO Physician Advisor  03/31/2020 3:17 PM

## 2020-03-31 NOTE — Progress Notes (Addendum)
Subjective:  Patient states that her abdominal pain was better after bowel prep but as soon as she ate last night she had recurrent pain. She tried to eat before taking pain medication. This morning pain after bacon. Pain in the upper abdomen. Radiates into chest. Going on for months. Tolerates liquids better than solids. Son states they can hardly get her to eat. Having to force fluids. Has hiccups with eating/drinking. No heartburn. Symptoms worse since 01/2020. Had Cdiff around that time. Denies diarrhea. Has decreased stools with decreased oral intake. Pain worse with constipation and meals. Better if lays down. Family states she sleeps 20 hours a day for past couple of months. They are concerned there may be depression playing a part in her symptoms. Last week pantoprazole increase to BID.   Objective: Vital signs in last 24 hours: Temp:  [98.4 F (36.9 C)-98.8 F (37.1 C)] 98.4 F (36.9 C) (03/16 0555) Pulse Rate:  [65-73] 68 (03/16 0555) Resp:  [16-18] 16 (03/16 0555) BP: (150-171)/(62-80) 150/80 (03/16 0555) SpO2:  [97 %-98 %] 98 % (03/16 0555) Last BM Date: 03/30/20 General:   Alert,  Well-developed, well-nourished, pleasant and cooperative in NAD Head:  Normocephalic and atraumatic. Eyes:  Sclera clear, no icterus.  Abdomen:  Soft, nondistended. Mild epigastric tendernessNormal bowel sounds, without guarding, and without rebound.   Extremities:  Without clubbing, deformity or edema. Neurologic:  Alert and  oriented x4;  grossly normal neurologically. Skin:  Intact without significant lesions or rashes. Psych:  Alert and cooperative. Normal mood and affect.  Intake/Output from previous day: 03/15 0701 - 03/16 0700 In: 940 [P.O.:440; I.V.:500] Out: 900 [Urine:900] Intake/Output this shift: Total I/O In: 240 [P.O.:240] Out: -   Lab Results: CBC Recent Labs    03/29/20 0551 03/30/20 0502 03/31/20 0509  WBC 5.2 4.3 5.2  HGB 10.2* 11.0* 11.3*  HCT 33.0* 35.0* 35.7*  MCV  97.1 96.7 97.5  PLT 289 277 266   BMET Recent Labs    03/29/20 0551 03/30/20 0502 03/31/20 0509  NA 137 137 136  K 4.8 4.1 4.1  CL 105 106 105  CO2 24 22 21*  GLUCOSE 91 78 77  BUN '12 10 10  '$ CREATININE 1.08* 1.04* 1.07*  CALCIUM 8.8* 8.7* 8.7*   LFTs Recent Labs    03/29/20 0551  BILITOT 0.5  ALKPHOS 58  AST 39  ALT 29  PROT 6.0*  ALBUMIN 2.8*   No results for input(s): LIPASE in the last 72 hours. PT/INR Recent Labs    03/29/20 0551  LABPROT 16.8*  INR 1.4*      Imaging Studies: Abdomen 1 view (KUB)  Result Date: 03/24/2020 CLINICAL DATA:  History of right-sided kidney stone. EXAM: ABDOMEN - 1 VIEW COMPARISON:  Radiographs 03/10/2020.  CT today and 02/27/2020. FINDINGS: Bilateral renal calculi are visible. No suspicious calcifications are seen along the course of the ureters as correlated with today CT. The bowel gas pattern is nonobstructive. AICD and epicardial pacing leads are present. There are degenerative changes throughout the spine associated with a convex left scoliosis. IMPRESSION: Bilateral renal calculi. No acute findings identified. Electronically Signed   By: Richardean Sale M.D.   On: 03/24/2020 16:51   Abdomen 1 view (KUB)  Result Date: 03/10/2020 CLINICAL DATA:  Nephrolithiasis EXAM: ABDOMEN - 1 VIEW COMPARISON:  02/27/2020, 08/04/2013 FINDINGS: Similar bilateral mid and lower pole subcentimeter nephrolithiasis. Renal shadows are obscured by stool and gas pattern. Comparing to the prior CT, the previous known right mid ureteral  calculus measuring 7 mm at the L4-5 level may still be present without significant migration. Degenerative changes scoliosis of the spine.  Bones are osteopenic. IMPRESSION: Suspect similarly positioned right mid ureteral calculus at the L4-5 level. Additional bilateral mid and lower pole nonobstructing nephrolithiasis. Electronically Signed   By: Jerilynn Mages.  Shick M.D.   On: 03/10/2020 11:28   CT RENAL STONE STUDY  Result Date:  03/24/2020 CLINICAL DATA:  Bilateral flank pain with nausea. Diagnosed with kidney stones 3 weeks ago. EXAM: CT ABDOMEN AND PELVIS WITHOUT CONTRAST TECHNIQUE: Multidetector CT imaging of the abdomen and pelvis was performed following the standard protocol without IV contrast. COMPARISON:  CT 02/27/2020. FINDINGS: Lower chest: Emphysematous changes and subpleural reticulation at both lung bases. A right lower lobe nodule measuring 6 mm on image 34/4 is similar to prior studies dating back to 05/11/2017 and earlier, consistent with a benign etiology. Cardiac AICD leads noted. Hepatobiliary: The liver appears somewhat dense without focal abnormality. No evidence of gallstones, gallbladder wall thickening or biliary dilatation. Pancreas: 7 mm low-density lesion in the pancreatic neck on image 26/2 is unchanged. This appears cystic on prior studies dating back to 01/08/2019 and is likely a benign finding. No pancreatic ductal dilatation or surrounding inflammation. Spleen: Normal in size without focal abnormality. Adrenals/Urinary Tract: Both adrenal glands appear normal. Extensive bilateral nephrocalcinosis with multiple caliceal calculi bilaterally. The previously demonstrated right-sided hydronephrosis and mid right ureteral calculus are no longer present. Stable chronic left renal atrophy. No perinephric soft tissue stranding. The bladder appears unremarkable. Stomach/Bowel: The stomach appears unremarkable for its degree of distension. No evidence of bowel wall thickening, distention or surrounding inflammatory change. The appendix appears normal. Vascular/Lymphatic: There are no enlarged abdominal or pelvic lymph nodes. Diffuse aortic and branch vessel atherosclerosis without evidence of aneurysm. Reproductive: Uterus and ovaries appear normal.  No adnexal mass. Other: No evidence of abdominal wall mass or hernia. No ascites. Multiple epicardial leads within the anterior abdominal wall. Musculoskeletal: No acute or  significant osseous findings. Multilevel spondylosis associated with a convex left scoliosis. Stable chronic T12 and L1 superior endplate compression deformities. IMPRESSION: 1. Interval passage of previously demonstrated mid right ureteral calculus with resolved hydronephrosis. Extensive bilateral nephrocalcinosis with multiple caliceal calculi bilaterally. 2. No acute findings or explanation for the patient's symptoms. 3. Stable chronic left renal atrophy. 4. Aortic Atherosclerosis (ICD10-I70.0) and Emphysema (ICD10-J43.9). Electronically Signed   By: Richardean Sale M.D.   On: 03/24/2020 16:48   CT Angio Abd/Pel W and/or Wo Contrast  Result Date: 03/28/2020 CLINICAL DATA:  Chronic mesenteric ischemia. Right-sided abdominal pain. EXAM: CTA ABDOMEN AND PELVIS WITHOUT AND WITH CONTRAST TECHNIQUE: Multidetector CT imaging of the abdomen and pelvis was performed using the standard protocol during bolus administration of intravenous contrast. Multiplanar reconstructed images and MIPs were obtained and reviewed to evaluate the vascular anatomy. CONTRAST:  23m OMNIPAQUE IOHEXOL 350 MG/ML SOLN COMPARISON:  None. FINDINGS: VASCULAR Aorta: Aortic course and caliber are normal. There is calcific aortic atherosclerosis. Focal dissection of the infrarenal aorta is unchanged (4:81). Celiac: Patent without evidence of aneurysm, dissection, vasculitis or significant stenosis. SMA: Patent without evidence of aneurysm, dissection, vasculitis or significant stenosis. Renals: Calcification causes unchanged stenosis of the proximal left renal artery. Right renal artery is normal. IMA: Patent with mild narrowing at the origin. Inflow: Severe stenosis of the left internal iliac artery due to mixed density atherosclerosis, unchanged. Otherwise unremarkable. Proximal Outflow: Bilateral common femoral and visualized portions of the superficial and profunda femoral arteries are  patent without evidence of aneurysm, dissection,  vasculitis or significant stenosis. Veins: The main portal vein, superior mesenteric vein and splenic vein are patent. The IVC is patent. Review of the MIP images confirms the above findings. NON-VASCULAR Lower chest: No acute abnormality. Hepatobiliary: No focal liver abnormality is seen. No gallstones, gallbladder wall thickening, or biliary dilatation. Pancreas: Unremarkable. No pancreatic ductal dilatation or surrounding inflammatory changes. Spleen: Normal in size without focal abnormality. Adrenals/Urinary Tract: Unchanged appearance of the kidneys with bilateral nephrocalcinosis. No hydronephrosis. Urinary bladder is distended. Stomach/Bowel: Small hiatal hernia. No bowel wall thickening. No pneumatosis. Lymphatic: No lymphadenopathy. Reproductive: Normal uterus.  No adnexal mass. Other: None Musculoskeletal: Lumbar levoscoliosis.  No acute abnormality. IMPRESSION: 1. No evidence of acute mesenteric ischemia. 2. Unchanged appearance of focal dissection of the infrarenal aorta. 3. Unchanged severe stenosis of the left internal iliac artery. 4. Unchanged stenosis of the proximal left renal artery. Aortic Atherosclerosis (ICD10-I70.0). Electronically Signed   By: Ulyses Jarred M.D.   On: 03/28/2020 01:53   US ABDOMEN LIMITED RUQ (LIVER/GB)  Result Date: 03/28/2020 CLINICAL DATA:  Intractable abdominal pain for several weeks. EXAM: ULTRASOUND ABDOMEN LIMITED RIGHT UPPER QUADRANT COMPARISON:  None. FINDINGS: Gallbladder: No gallstones or wall thickening visualized. No sonographic Murphy sign noted by sonographer. Common bile duct: Diameter: 3 mm Liver: Increased echogenicity with no focal mass. Portal vein is patent on color Doppler imaging with normal direction of blood flow towards the liver. Other: None. IMPRESSION: 1. No cause for pain identified. Electronically Signed   By: Dorise Bullion III M.D   On: 03/28/2020 10:34  [2 weeks]   Assessment: 87 year old female with complex history of acute on  chronic epigastric pain, presenting for several week history of worsening postprandial abdominal pain and nausea.   Epigastric pain: With prior extensive evaluations to include EGD in November 2021 (nonobstructive Schatzki ring, normal stomach and duodenum), CTA November 2021 negative for chronic mesenteric ischemia, ultrasound with fatty liver and no gallstones, h/o kidney stone with hydronephrosis with CT renal stone study March 10/05/2020 with interval passage of previously demonstrated mid right ureteral calculus with resolved hydronephrosis, 7 mm low-density lesion in the pancreatic neck appears cystic on prior studies, likely benign finding, possible IPMN could consider surveillance in 2 years per guidelines. She has been on Elavil and Bentyl in the past but unable to continue Elavil due to prolonged QTc.  More recently has been on paroxetine. Per family, had been started on Vicodin in 02/2020 for kidney stones and taking scheduled about four times daily.   During this hospitalization, she had repeat CTA without evidence of acute mesenteric ischemia.  Ultrasound abdomen without gallstones.  Started on BuSpar 5 mg 3 times daily, Levsin every 6 hours as needed.  Colonoscopy this admission: Small and large mouth diverticula in the sigmoid colon and descending colon.  6 mm carpet-like polyp in the sigmoid colon, not removed as the patient is on Eliquis.  ?functional component. Per family, she sleeps 20 hours a day, while awake complains of abdominal pain. Her pain is usually postprandial. ?biliary dyskinesia.   Normocytic anemia: Chronic dating back at least 1 year. Iron labs this admission normal. Folate normal. B12 normal. Suspect anemia of chronic disease.  Low a.m. cortisol: A.m. cortisol level of 5.3.  We will need endocrinology follow-up.  Plan: 1. Continue BuSpar and Levsin 2. Continue pantoprazole '40mg'$  BID.  3. F/u with endocrinology for low cortisol level.  4. To discuss further treatment  options with Dr. Laural Golden. Further  recommendations to follow.   Laureen Ochs. Bernarda Caffey [facility] [phone] 3/16/20221:38 PM     LOS: 3 days

## 2020-03-31 NOTE — Plan of Care (Signed)

## 2020-03-31 NOTE — Progress Notes (Signed)
PROGRESS NOTE    Renee GERSHENSON  [id number] DOB: 19-Oct-1931 DOA: 03/27/2020 PCP: [name], DO   Brief Narrative:   [name] a 87 y.o.femalewith medical history significant foratrial fibrillation on Eliquis, CAD, HTN, HLD,AICD, CKD stage III, IBS, nephrolithiasis, arthritis, history of COVID-19 in 12/2018, and GERD with esophagitis who presented with complaintof several weeks of ongoing upper abdominal pain.Patient has been admitted for abdominal pain evaluation, but no significant findings noted on lab work or imaging is apparent.  GI has performed colonoscopy on 3/15 with noted diverticulosis in the sigmoid colon and polyp that was not removed.  Assessment & Plan:   Principal Problem:   Intractable abdominal pain Active Problems:   CKD (chronic kidney disease), stage III (HCC)   Essential hypertension   Dehydration   Hypokalemia   GERD (gastroesophageal reflux disease)   Atrial fibrillation, chronic (HCC)   Hyperlipidemia   Nausea   Hypothyroidism   Hyponatremia   Prolonged QT interval   Lactic acidosis   Elevated troponin I level   Intractable abdominal pain - seems to have resolved now -Improved and mostly postprandial -Uncertain etiology, but a.m. cortisol noted to be low and patient will require endocrinology follow-up -Continue Levsin and BuSpar per GI -Colonoscopy on 3/15 without any significant findings -Seems to be tolerating soft diet  Normocytic anemia -Anemia panel without significant findings  GERD/nausea -IV Compazine as needed for nausea  Chronic hyponatremia-improved -IV hydration held  Lactic acidosis-resolved -Likely related to dehydration  Hypothyroidism -Continue Synthroid  Chronic atrial fibrillation -Continue Eliquis for full anticoagulation -Currently in NSR -Amiodarone and metoprolol restarted  CKDIIIa-stable  Essential hypertension -Continue hydralazine and  Imdur  Dyslipidemia -Continue Crestor  Status postAICD   DVT prophylaxis: Eliquis Code Status: DNR Family Communication: Discussed with granddaughter [place] at bedside  Disposition Plan: Home when cleared by GI team.  Status is: Inpatient  Remains inpatient appropriate because:IV treatments appropriate due to intensity of illness or inability to take PO and Inpatient level of care appropriate due to severity of illness   Dispo: The patient is from: Home  Anticipated d/c is to: Home  Patient currently is not medically stable to d/c.              Difficult to place patient No  Consultants:   GI  Procedures:   See below  Antimicrobials:   None  Subjective: Pt tolerating soft diet.    Objective: Vitals:   03/30/20 1415 03/30/20 1549 03/30/20 2130 03/31/20 0555  BP: (!) 155/62 (!) 171/77 (!) 161/65 (!) 150/80  Pulse: 65  73 68  Resp: '18  18 16  '$ Temp: 98.8 F (37.1 C)  98.6 F (37 C) 98.4 F (36.9 C)  TempSrc: Oral  Oral Oral  SpO2: 97%  97% 98%  Weight:      Height:        Intake/Output Summary (Last 24 hours) at 03/31/2020 1229 Last data filed at 03/31/2020 0900 Gross per 24 hour  Intake 480 ml  Output 900 ml  Net -420 ml   Filed Weights   03/27/20 2228 03/28/20 0636  Weight: 61.2 kg 58.5 kg    Examination:  General exam: Appears calm and comfortable, hard of hearing Respiratory system: Clear to auscultation. Respiratory effort normal. Cardiovascular system: normal s1, s2 sounds,  Gastrointestinal system: Abd soft, nondistended, nontender, no masses. No HSM.  Central nervous system: no focal findings.   Extremities: no C/C/E.  Skin: No gross abnormalities seen.  Psychiatry: normal mood  and behavior.   Data Reviewed: I have personally reviewed following labs and imaging studies  CBC: Recent Labs  Lab 03/24/20 2148 03/27/20 2349 03/29/20 0551 03/30/20 0502 03/31/20 0509  WBC 4.9 5.0 5.2 4.3 5.2   NEUTROABS 2.5 2.5  --   --   --   HGB 11.3* 11.5* 10.2* 11.0* 11.3*  HCT 34.3* 35.0* 33.0* 35.0* 35.7*  MCV 92.5 92.6 97.1 96.7 97.5  PLT 327 318 289 [id number]   Basic Metabolic Panel: Recent Labs  Lab 03/24/20 2148 03/27/20 2349 03/29/20 0551 03/30/20 0502 03/31/20 0509  NA 131* 132* 137 137 136  K 3.6 3.2* 4.8 4.1 4.1  CL 98 97* 105 106 105  CO2 '24 23 24 22 '$ 21*  GLUCOSE 117* 98 91 78 77  BUN '18 18 12 10 10  '$ CREATININE 1.05* 1.17* 1.08* 1.04* 1.07*  CALCIUM 8.9 8.8* 8.8* 8.7* 8.7*  MG  --   --  1.9 2.1 2.0  PHOS  --   --  3.6  --   --    GFR: Estimated Creatinine Clearance: 31.2 mL/min (A) (by C-G formula based on SCr of 1.07 mg/dL (H)). Liver Function Tests: Recent Labs  Lab 03/24/20 2148 03/27/20 2349 03/29/20 0551  AST 45* 38 39  ALT 45* 34 29  ALKPHOS 69 68 58  BILITOT 0.7 0.7 0.5  PROT 6.5 6.6 6.0*  ALBUMIN 3.1* 3.2* 2.8*   Recent Labs  Lab 03/24/20 2148 03/27/20 2349  LIPASE 33 33   No results for input(s): AMMONIA in the last 168 hours. Coagulation Profile: Recent Labs  Lab 03/29/20 0551  INR 1.4*   Cardiac Enzymes: No results for input(s): CKTOTAL, CKMB, CKMBINDEX, TROPONINI in the last 168 hours. BNP (last 3 results) No results for input(s): PROBNP in the last 8760 hours. HbA1C: No results for input(s): HGBA1C in the last 72 hours. CBG: Recent Labs  Lab 03/28/20 0748 03/28/20 1103 03/28/20 1636 03/28/20 2018  GLUCAP 131* 118* 144* 98   Lipid Profile: No results for input(s): CHOL, HDL, LDLCALC, TRIG, CHOLHDL, LDLDIRECT in the last 72 hours. Thyroid Function Tests: No results for input(s): TSH, T4TOTAL, FREET4, T3FREE, THYROIDAB in the last 72 hours. Anemia Panel: Recent Labs    03/28/20 2344 03/29/20 0554  VITAMINB12 1,049*  --   FOLATE 36.5  --   FERRITIN 64  --   TIBC 309  --   IRON 45  --   RETICCTPCT  --  1.8   Sepsis Labs: Recent Labs  Lab 03/27/20 2349 03/28/20 0156 03/28/20 0821 03/28/20 1036  LATICACIDVEN  2.7* 2.5* 1.3 1.0    Recent Results (from the past 240 hour(s))  Microscopic Examination     Status: Abnormal   Collection Time: 03/24/20  1:12 PM   Urine  Result Value Ref Range Status   WBC, UA 0-5 0 - 5 /hpf Final   RBC 0-2 0 - 2 /hpf Final   Epithelial Cells (non renal) >10 (A) 0 - 10 /hpf Final   Renal Epithel, UA None seen None seen /hpf Final   Bacteria, UA Few None seen/Few Final  Resp Panel by RT-PCR (Flu A&B, Covid) Nasopharyngeal Swab     Status: None   Collection Time: 03/28/20  4:16 AM   Specimen: Nasopharyngeal Swab; Nasopharyngeal(NP) swabs in vial transport medium  Result Value Ref Range Status   SARS Coronavirus 2 by RT PCR NEGATIVE NEGATIVE Final    Comment: (NOTE) SARS-CoV-2 target nucleic acids are NOT DETECTED.  The  SARS-CoV-2 RNA is generally detectable in upper respiratory specimens during the acute phase of infection. The lowest concentration of SARS-CoV-2 viral copies this assay can detect is 138 copies/mL. A negative result does not preclude SARS-Cov-2 infection and should not be used as the sole basis for treatment or other patient management decisions. A negative result may occur with  improper specimen collection/handling, submission of specimen other than nasopharyngeal swab, presence of viral mutation(s) within the areas targeted by this assay, and inadequate number of viral copies(<138 copies/mL). A negative result must be combined with clinical observations, patient history, and epidemiological information. The expected result is Negative.  Fact Sheet for Patients:  EntrepreneurPulse.com.au  Fact Sheet for Healthcare Providers:  IncredibleEmployment.be  This test is no t yet approved or cleared by the [place] FDA and  has been authorized for detection and/or diagnosis of SARS-CoV-2 by FDA under an Emergency Use Authorization (EUA). This EUA will remain  in effect (meaning this test can be used) for  the duration of the COVID-19 declaration under Section 564(b)(1) of the Act, 21 U.S.C.section 360bbb-3(b)(1), unless the authorization is terminated  or revoked sooner.       Influenza A by PCR NEGATIVE NEGATIVE Final   Influenza B by PCR NEGATIVE NEGATIVE Final    Comment: (NOTE) The Xpert Xpress SARS-CoV-2/FLU/RSV plus assay is intended as an aid in the diagnosis of influenza from Nasopharyngeal swab specimens and should not be used as a sole basis for treatment. Nasal washings and aspirates are unacceptable for Xpert Xpress SARS-CoV-2/FLU/RSV testing.  Fact Sheet for Patients: EntrepreneurPulse.com.au  Fact Sheet for Healthcare Providers: IncredibleEmployment.be  This test is not yet approved or cleared by the [place] FDA and has been authorized for detection and/or diagnosis of SARS-CoV-2 by FDA under an Emergency Use Authorization (EUA). This EUA will remain in effect (meaning this test can be used) for the duration of the COVID-19 declaration under Section 564(b)(1) of the Act, 21 U.S.C. section 360bbb-3(b)(1), unless the authorization is terminated or revoked.  Performed at [facility], [address] [place], [place] [address]     Radiology Studies: No results found.  Scheduled Meds: . apixaban  2.5 mg Oral BID  . busPIRone  5 mg Oral TID  . hydrALAZINE  25 mg Oral TID  . HYDROcodone-acetaminophen  1 tablet Oral Q6H  . isosorbide mononitrate  15 mg Oral Daily  . levothyroxine  100 mcg Oral Daily  . pantoprazole  40 mg Oral BID AC  . rosuvastatin  10 mg Oral Daily    LOS: 3 days   Time spent: 35 minutes  Konstantine Gervasi Wynetta Emery, MD How to contact the [facility] Attending or Consulting provider [place] or covering provider during after hours [place], for this patient?  1. Check the care team in [facility] and look for a) attending/consulting TR[facility] provider listed and b) the [facility] team listed 2. Log into [email or web address] and use [place]'s  universal password to access. If you do not have the password, please contact the hospital operator. 3. Locate the [facility] provider you are looking for under [facility] and page to a number that you can be directly reached. 4. If you still have difficulty reaching the provider, please page the [facility] (Director on Call) for the Hospitalists listed on amion for assistance.   If 7PM-7AM, please contact night-coverage [email or web address] 03/31/2020, 12:29 PM

## 2020-03-31 NOTE — Evaluation (Signed)
Physical Therapy Evaluation Patient Details Name: LEGACY KUZNIK MRN: [id number] DOB: 10/19/1931 Today's Date: 03/31/2020   History of Present Illness  IBET WAAG is a 87 y.o. female with medical history significant for atrial fibrillation on Eliquis, CAD, HTN, HLD, AICD, CKD stage III, IBS, nephrolithiasis, arthritis, history of COVID-19 in 12/2018, and GERD with esophagitis who presented with complaint of several weeks of ongoing upper abdominal pain.  Pain was in epigastric and right upper quadrant area, it was constant and wax and wane, pain worsens with food, no alleviating factor was known.  Patient was seen in the ED 2 days ago and work-up at that time was reassuring, it was thought that she must have passed a kidney stone (patient does not think so).  She endorsed having nausea without vomiting, she denies fever, chills, headache, chest pain or shortness of breath.    Clinical Impression  Patient demonstrates slow labored movement for sitting up at bedside, very unsteady on feet using RW, at mod to high risk for falls demonstrating excessive hiking of hips with narrow base of support during ambulation in room, limited mostly due to fatigue and c/o stomach pain.  Patient tolerated sitting up in chair after therapy with her family members present in room.  Patient will benefit from continued physical therapy in hospital and recommended venue below to increase strength, balance, endurance for safe ADLs and gait.      Follow Up Recommendations SNF;Supervision for mobility/OOB;Supervision - Intermittent    Equipment Recommendations  None recommended by PT    Recommendations for Other Services       Precautions / Restrictions Precautions Precautions: Fall Restrictions Weight Bearing Restrictions: No      Mobility  Bed Mobility Overal bed mobility: Needs Assistance Bed Mobility: Supine to Sit     Supine to sit: Min guard     General bed mobility comments: increased  time, labored movement    Transfers Overall transfer level: Needs assistance   Transfers: Sit to/from Stand;Stand Pivot Transfers Sit to Stand: Min assist Stand pivot transfers: Min assist       General transfer comment: slow labored movement  Ambulation/Gait Ambulation/Gait assistance: Min assist;Mod assist Gait Distance (Feet): 20 Feet Assistive device: Rolling walker (2 wheeled) Gait Pattern/deviations: Decreased step length - right;Decreased step length - left;Decreased stride length;Narrow base of support Gait velocity: decreased   General Gait Details: slow labored unsteady cadence with excessive hiking of hips, had most difficulty making turns and limited mostly due to c/o fatigue and stomach pain  Stairs            Wheelchair Mobility    Modified Rankin (Stroke Patients Only)       Balance Overall balance assessment: Needs assistance Sitting-balance support: Feet supported;No upper extremity supported Sitting balance-Leahy Scale: Fair Sitting balance - Comments: fair/good seated at EOB   Standing balance support: During functional activity;Bilateral upper extremity supported Standing balance-Leahy Scale: Poor Standing balance comment: fair/poor using RW                             Pertinent Vitals/Pain Pain Assessment: Faces Faces Pain Scale: Hurts little more Pain Location: stomach Pain Descriptors / Indicators: Aching;Grimacing Pain Intervention(s): Limited activity within patient's tolerance;Monitored during session;Repositioned    Home Living Family/patient expects to be discharged to:: Private residence Living Arrangements: Alone Available Help at Discharge: Family;Personal care attendant;Available PRN/intermittently Type of Home: House Home Access: Stairs to enter Entrance Stairs-Rails: None  Entrance Stairs-Number of Steps: 1 Home Layout: One level;Laundry or work area in basement;Able to live on main level with  bedroom/bathroom Home Equipment: Bedside commode;Wheelchair - Visual merchandiser - 2 wheels;Walker - 4 wheels;Grab bars - tub/shower;Hand held shower head;Cane - single point Additional Comments: daughter lives next door, son across the street.    Prior Function Level of Independence: Needs assistance   Gait / Transfers Assistance Needed: Merchant navy officer  ADL's / Homemaking Assistance Needed: assisted by family and home aides during day, patient alone at night        Hand Dominance   Dominant Hand: Right    Extremity/Trunk Assessment   Upper Extremity Assessment Upper Extremity Assessment: Generalized weakness    Lower Extremity Assessment Lower Extremity Assessment: Generalized weakness    Cervical / Trunk Assessment Cervical / Trunk Assessment: Normal  Communication   Communication: HOH  Cognition Arousal/Alertness: Awake/alert Behavior During Therapy: WFL for tasks assessed/performed Overall Cognitive Status: Within Functional Limits for tasks assessed                                        General Comments      Exercises     Assessment/Plan    PT Assessment Patient needs continued PT services  PT Problem List Decreased strength;Decreased activity tolerance;Decreased balance;Decreased mobility       PT Treatment Interventions DME instruction;Gait training;Stair training;Functional mobility training;Therapeutic activities;Therapeutic exercise;Patient/family education;Balance training    PT Goals (Current goals can be found in the Care Plan section)  Acute Rehab PT Goals Patient Stated Goal: return home with family and home aides to assist PT Goal Formulation: With patient/family Time For Goal Achievement: 04/14/20 Potential to Achieve Goals: Good    Frequency Min 3X/week   Barriers to discharge        Co-evaluation               AM-PAC PT "6 Clicks" Mobility  Outcome Measure Help needed turning  from your back to your side while in a flat bed without using bedrails?: None Help needed moving from lying on your back to sitting on the side of a flat bed without using bedrails?: A Little Help needed moving to and from a bed to a chair (including a wheelchair)?: A Lot Help needed standing up from a chair using your arms (e.g., wheelchair or bedside chair)?: A Little Help needed to walk in hospital room?: A Lot Help needed climbing 3-5 steps with a railing? : A Lot 6 Click Score: 16    End of Session   Activity Tolerance: Patient tolerated treatment well;Patient limited by fatigue Patient left: in chair;with call bell/phone within reach;with family/visitor present Nurse Communication: Mobility status PT Visit Diagnosis: Unsteadiness on feet (R26.81);Other abnormalities of gait and mobility (R26.89);Muscle weakness (generalized) (M62.81)    Time: 1020-1045 PT Time Calculation (min) (ACUTE ONLY): 25 min   Charges:   PT Evaluation $PT Eval Moderate Complexity: 1 Mod PT Treatments $Therapeutic Activity: 23-37 mins        2:19 PM, 03/31/20 Lonell Grandchild, MPT Physical Therapist with [facility] [phone] office [phone] mobile phone

## 2020-04-01 ENCOUNTER — Encounter (HOSPITAL_COMMUNITY): Payer: Self-pay | Admitting: Internal Medicine

## 2020-04-01 ENCOUNTER — Inpatient Hospital Stay (HOSPITAL_COMMUNITY): Payer: Medicare Other

## 2020-04-01 DIAGNOSIS — R109 Unspecified abdominal pain: Secondary | ICD-10-CM | POA: Diagnosis not present

## 2020-04-01 DIAGNOSIS — N1831 Chronic kidney disease, stage 3a: Secondary | ICD-10-CM | POA: Diagnosis not present

## 2020-04-01 DIAGNOSIS — I482 Chronic atrial fibrillation, unspecified: Secondary | ICD-10-CM | POA: Diagnosis not present

## 2020-04-01 DIAGNOSIS — E860 Dehydration: Secondary | ICD-10-CM | POA: Diagnosis not present

## 2020-04-01 LAB — CORTISOL-AM, BLOOD: Cortisol - AM: 5.4 ug/dL — ABNORMAL LOW (ref 6.7–22.6)

## 2020-04-01 MED ORDER — HYDROMORPHONE HCL 1 MG/ML IJ SOLN
0.2500 mg | INTRAMUSCULAR | Status: DC | PRN
Start: 2020-04-01 — End: 2020-04-05
  Administered 2020-04-01: 0.25 mg via INTRAVENOUS
  Filled 2020-04-01: qty 0.5

## 2020-04-01 MED ORDER — HYDROCODONE-ACETAMINOPHEN 5-325 MG PO TABS
1.0000 | ORAL_TABLET | ORAL | Status: DC | PRN
  Administered 2020-04-01 – 2020-04-03 (×5): 1 via ORAL
  Filled 2020-04-01 (×6): qty 1

## 2020-04-01 MED ORDER — ENSURE ENLIVE PO LIQD
237.0000 mL | Freq: Three times a day (TID) | ORAL | Status: DC
  Administered 2020-04-01 – 2020-04-06 (×13): 237 mL via ORAL

## 2020-04-01 MED ORDER — SINCALIDE 5 MCG IJ SOLR
INTRAMUSCULAR | Status: AC
  Administered 2020-04-01: 1.18 ug via INTRAVENOUS
  Filled 2020-04-01: qty 5

## 2020-04-01 MED ORDER — TECHNETIUM TC 99M MEBROFENIN IV KIT
5.0000 | PACK | Freq: Once | INTRAVENOUS | Status: AC | PRN
  Administered 2020-04-01: 5.1 via INTRAVENOUS

## 2020-04-01 MED ORDER — STERILE WATER FOR INJECTION IJ SOLN
INTRAMUSCULAR | Status: AC
  Administered 2020-04-01: 1.18 mL via INTRAVENOUS
  Filled 2020-04-01: qty 10

## 2020-04-01 NOTE — TOC Progression Note (Signed)
Transition of Care [facility]) - Progression Note    Patient Details  Name: MIAYA FORESMAN MRN: [id number] Date of Birth: 1931/02/02  Transition of [facility] CM/SW Contact  Ihor Gully, LCSW Phone Number: 04/01/2020, 3:37 PM  Clinical Narrative:    Spoke with son, [name], wants patient to go to rehab. Advised that patient was A&O and declined SNF, preferring HH. Ronnie to speak with patient.  Follow up to patient. Patient stated that she spoke with [facility] and she is not going to SNF. She wants HH.    Expected Discharge Plan: [place] Barriers to Discharge: Continued Medical Work up  Expected Discharge Plan and Services Expected Discharge Plan: [place] Choice: NA Living arrangements for the past 2 months: Single Family Home                 DME Arranged: N/A DME Agency: NA         HH Agency: NA         Social Determinants of Health (SDOH) Interventions    Readmission Risk Interventions Readmission Risk Prevention Plan 03/28/2020 11/17/2019  Transportation Screening Complete Complete  Medication Review Press photographer) Complete Complete  PCP or Specialist appointment within 3-5 days of discharge - Complete  HRI or [place] Complete Complete  SW Recovery Care/Counseling Consult Complete Complete  Palliative Care Screening Not Applicable Not [place] Not Applicable Not Applicable  Some recent data might be hidden

## 2020-04-01 NOTE — Progress Notes (Addendum)
PROGRESS NOTE    [name]  [id number] DOB: Jul 27, 1931 DOA: 03/27/2020 PCP: [name], DO   Brief Narrative:   [name] a 87 y.o.femalewith medical history significant foratrial fibrillation on Eliquis, CAD, HTN, HLD,AICD, CKD stage III, IBS, nephrolithiasis, arthritis, history of COVID-19 in 12/2018, and GERD with esophagitis who presented with complaintof several weeks of ongoing upper abdominal pain.Patient has been admitted for abdominal pain evaluation, but no significant findings noted on lab work or imaging is apparent.  GI has performed colonoscopy on 3/15 with noted diverticulosis in the sigmoid colon and polyp that was not removed.  Assessment & Plan:   Principal Problem:   Intractable abdominal pain Active Problems:   CKD (chronic kidney disease), stage III (HCC)   Essential hypertension   Dehydration   Hypokalemia   GERD (gastroesophageal reflux disease)   Atrial fibrillation, chronic (HCC)   Hyperlipidemia   Nausea   Hypothyroidism   Hyponatremia   Prolonged QT interval   Lactic acidosis   Elevated troponin I level   Intractable abdominal pain - seems to have resolved now -HIDA scan 3/17 results still pending. -Pain Improved and mostly postprandial -Uncertain etiology, but a.m. cortisol noted to be low and patient will require endocrinology follow-up with ambulatory referral.  -Continue Levsin and BuSpar per GI -Colonoscopy on 3/15 without any significant findings -Seems to be tolerating soft diet  Normocytic anemia -Anemia panel without significant findings  GERD/nausea -IV Compazine as needed for nausea  Chronic hyponatremia-improved  Lactic acidosis-resolved -Likely related to dehydration  Hypothyroidism -Continue Synthroid  Chronic atrial fibrillation -Continue Eliquis for full anticoagulation -Currently in NSR -Amiodarone and metoprolol restarted  CKDIIIa-stable  Essential  hypertension -Continue hydralazine and Imdur  Dyslipidemia -Continue Crestor  Status postAICD  Toe Pain - Follow up outpatient with her podiatrist Dr. Irving Shows -He is planning amputation of left first toe   DVT prophylaxis: Eliquis Code Status: DNR Family Communication: Discussed with granddaughter [place] by telephone 3/18  Disposition Plan: Home when cleared by GI team.  Status is: Inpatient  Remains inpatient appropriate because:IV treatments appropriate due to intensity of illness or inability to take PO and Inpatient level of care appropriate due to severity of illness  Dispo: The patient is from: Home  Anticipated d/c is to: Home  Patient currently is not medically stable to d/c.              Difficult to place patient No  Consultants:   GI  Procedures:   See below  Antimicrobials:   None  Subjective: Pt had HIDA scan this morning, tolerated procedure, now wanting to eat diet. Had severe left first toe pain last night.     Objective: Vitals:   03/31/20 1329 03/31/20 1756 03/31/20 2037 04/01/20 0558  BP: 129/65 (!) 147/72 (!) 159/73 (!) 147/74  Pulse: 68 69 71 (!) 57  Resp: '18  16 18  '$ Temp: (!) 97.2 F (36.2 C)  98.5 F (36.9 C) 98.6 F (37 C)  TempSrc: Oral  Oral Oral  SpO2: 97%  95% 95%  Weight:      Height:        Intake/Output Summary (Last 24 hours) at 04/01/2020 1125 Last data filed at 04/01/2020 0058 Gross per 24 hour  Intake 360 ml  Output 450 ml  Net -90 ml   Filed Weights   03/27/20 2228 03/28/20 0636  Weight: 61.2 kg 58.5 kg    Examination:  General exam: Appears calm and comfortable, hard of hearing  Respiratory system: Clear to auscultation. Respiratory effort normal. Cardiovascular system: normal s1, s2 sounds,  Gastrointestinal system: Abd soft, nondistended, nontender, no masses. No HSM.  Central nervous system: no focal findings.   Extremities: no C/C/E.  Skin: No gross abnormalities seen.   Psychiatry: normal mood and behavior.   Data Reviewed: I have personally reviewed following labs and imaging studies  CBC: Recent Labs  Lab 03/27/20 2349 03/29/20 0551 03/30/20 0502 03/31/20 0509  WBC 5.0 5.2 4.3 5.2  NEUTROABS 2.5  --   --   --   HGB 11.5* 10.2* 11.0* 11.3*  HCT 35.0* 33.0* 35.0* 35.7*  MCV 92.6 97.1 96.7 97.5  PLT 318 289 277 [id number]   Basic Metabolic Panel: Recent Labs  Lab 03/27/20 2349 03/29/20 0551 03/30/20 0502 03/31/20 0509  NA 132* 137 137 136  K 3.2* 4.8 4.1 4.1  CL 97* 105 106 105  CO2 '23 24 22 '$ 21*  GLUCOSE 98 91 78 77  BUN '18 12 10 10  '$ CREATININE 1.17* 1.08* 1.04* 1.07*  CALCIUM 8.8* 8.8* 8.7* 8.7*  MG  --  1.9 2.1 2.0  PHOS  --  3.6  --   --    GFR: Estimated Creatinine Clearance: 31.2 mL/min (A) (by C-G formula based on SCr of 1.07 mg/dL (H)). Liver Function Tests: Recent Labs  Lab 03/27/20 2349 03/29/20 0551  AST 38 39  ALT 34 29  ALKPHOS 68 58  BILITOT 0.7 0.5  PROT 6.6 6.0*  ALBUMIN 3.2* 2.8*   Recent Labs  Lab 03/27/20 2349  LIPASE 33   No results for input(s): AMMONIA in the last 168 hours. Coagulation Profile: Recent Labs  Lab 03/29/20 0551  INR 1.4*   Cardiac Enzymes: No results for input(s): CKTOTAL, CKMB, CKMBINDEX, TROPONINI in the last 168 hours. BNP (last 3 results) No results for input(s): PROBNP in the last 8760 hours. HbA1C: No results for input(s): HGBA1C in the last 72 hours. CBG: Recent Labs  Lab 03/28/20 0748 03/28/20 1103 03/28/20 1636 03/28/20 2018  GLUCAP 131* 118* 144* 98   Lipid Profile: No results for input(s): CHOL, HDL, LDLCALC, TRIG, CHOLHDL, LDLDIRECT in the last 72 hours. Thyroid Function Tests: No results for input(s): TSH, T4TOTAL, FREET4, T3FREE, THYROIDAB in the last 72 hours. Anemia Panel: No results for input(s): VITAMINB12, FOLATE, FERRITIN, TIBC, IRON, RETICCTPCT in the last 72 hours. Sepsis Labs: Recent Labs  Lab 03/27/20 2349 03/28/20 0156 03/28/20 0821  03/28/20 1036  LATICACIDVEN 2.7* 2.5* 1.3 1.0    Recent Results (from the past 240 hour(s))  Microscopic Examination     Status: Abnormal   Collection Time: 03/24/20  1:12 PM   Urine  Result Value Ref Range Status   WBC, UA 0-5 0 - 5 /hpf Final   RBC 0-2 0 - 2 /hpf Final   Epithelial Cells (non renal) >10 (A) 0 - 10 /hpf Final   Renal Epithel, UA None seen None seen /hpf Final   Bacteria, UA Few None seen/Few Final  Resp Panel by RT-PCR (Flu A&B, Covid) Nasopharyngeal Swab     Status: None   Collection Time: 03/28/20  4:16 AM   Specimen: Nasopharyngeal Swab; Nasopharyngeal(NP) swabs in vial transport medium  Result Value Ref Range Status   SARS Coronavirus 2 by RT PCR NEGATIVE NEGATIVE Final    Comment: (NOTE) SARS-CoV-2 target nucleic acids are NOT DETECTED.  The SARS-CoV-2 RNA is generally detectable in upper respiratory specimens during the acute phase of infection. The lowest concentration of SARS-CoV-2  viral copies this assay can detect is 138 copies/mL. A negative result does not preclude SARS-Cov-2 infection and should not be used as the sole basis for treatment or other patient management decisions. A negative result may occur with  improper specimen collection/handling, submission of specimen other than nasopharyngeal swab, presence of viral mutation(s) within the areas targeted by this assay, and inadequate number of viral copies(<138 copies/mL). A negative result must be combined with clinical observations, patient history, and epidemiological information. The expected result is Negative.  Fact Sheet for Patients:  EntrepreneurPulse.com.au  Fact Sheet for Healthcare Providers:  IncredibleEmployment.be  This test is no t yet approved or cleared by the [place] FDA and  has been authorized for detection and/or diagnosis of SARS-CoV-2 by FDA under an Emergency Use Authorization (EUA). This EUA will remain  in effect (meaning  this test can be used) for the duration of the COVID-19 declaration under Section 564(b)(1) of the Act, 21 U.S.C.section 360bbb-3(b)(1), unless the authorization is terminated  or revoked sooner.       Influenza A by PCR NEGATIVE NEGATIVE Final   Influenza B by PCR NEGATIVE NEGATIVE Final    Comment: (NOTE) The Xpert Xpress SARS-CoV-2/FLU/RSV plus assay is intended as an aid in the diagnosis of influenza from Nasopharyngeal swab specimens and should not be used as a sole basis for treatment. Nasal washings and aspirates are unacceptable for Xpert Xpress SARS-CoV-2/FLU/RSV testing.  Fact Sheet for Patients: EntrepreneurPulse.com.au  Fact Sheet for Healthcare Providers: IncredibleEmployment.be  This test is not yet approved or cleared by the [place] FDA and has been authorized for detection and/or diagnosis of SARS-CoV-2 by FDA under an Emergency Use Authorization (EUA). This EUA will remain in effect (meaning this test can be used) for the duration of the COVID-19 declaration under Section 564(b)(1) of the Act, 21 U.S.C. section 360bbb-3(b)(1), unless the authorization is terminated or revoked.  Performed at [facility], [address], [place], [place] [address]     Radiology Studies: No results found.  Scheduled Meds: . amiodarone  200 mg Oral Daily  . apixaban  2.5 mg Oral BID  . busPIRone  5 mg Oral TID  . cholecalciferol  2,000 Units Oral Daily  . feeding supplement  237 mL Oral TID BM  . hydrALAZINE  25 mg Oral TID  . HYDROcodone-acetaminophen  1 tablet Oral Q6H  . isosorbide mononitrate  30 mg Oral Daily  . levothyroxine  100 mcg Oral Daily  . metoprolol tartrate  25 mg Oral BID  . multivitamin with minerals  1 tablet Oral Daily  . pantoprazole  40 mg Oral BID AC  . rosuvastatin  10 mg Oral Daily  . tamsulosin  0.4 mg Oral QPC supper    LOS: 4 days   Time spent: 35 minutes  Clanford Wynetta Emery, MD How to contact the  [facility] Attending or Consulting provider [place] or covering provider during after hours [place], for this patient?  1. Check the care team in [facility] and look for a) attending/consulting TR[facility] provider listed and b) the [facility] team listed 2. Log into [email or web address] and use [place]'s universal password to access. If you do not have the password, please contact the hospital operator. 3. Locate the [facility] provider you are looking for under [facility] and page to a number that you can be directly reached. 4. If you still have difficulty reaching the provider, please page the [facility] (Director on Call) for the Hospitalists listed on amion for  assistance.   If 7PM-7AM, please contact night-coverage [email or web address] 04/01/2020, 11:25 AM

## 2020-04-01 NOTE — Progress Notes (Signed)
Physical Therapy Treatment Patient Details Name: [name] MRN: [id number] DOB: 1931-11-09 Today's Date: 04/01/2020    History of Present Illness [name] is a 87 y.o. female with medical history significant for atrial fibrillation on Eliquis, CAD, HTN, HLD, AICD, CKD stage III, IBS, nephrolithiasis, arthritis, history of COVID-19 in 12/2018, and GERD with esophagitis who presented with complaint of several weeks of ongoing upper abdominal pain.  Pain was in epigastric and right upper quadrant area, it was constant and wax and wane, pain worsens with food, no alleviating factor was known.  Patient was seen in the ED 2 days ago and work-up at that time was reassuring, it was thought that she must have passed a kidney stone (patient does not think so).  She endorsed having nausea without vomiting, she denies fever, chills, headache, chest pain or shortness of breath.    PT Comments    Patient presents seated in chair (assisted by nursing staff) and agreeable for therapy.  Patient demonstrates increased distance/endurance for gait training without loss of balance, limited right ankle dorsiflexion and limited mostly due to fatigue.  Patient demonstrates fair/good return for completing BLE ROM/strengthening exercises while seated in chair with verbal cues and occasional demonstration.  Patient tolerated staying up in chair after therapy.  Patient will benefit from continued physical therapy in hospital and recommended venue below to increase strength, balance, endurance for safe ADLs and gait.     Follow Up Recommendations  SNF;Supervision for mobility/OOB;Supervision - Intermittent     Equipment Recommendations  None recommended by PT    Recommendations for Other Services       Precautions / Restrictions Precautions Precautions: Fall Restrictions Weight Bearing Restrictions: No    Mobility  Bed Mobility               General bed mobility comments: Patient presents [place]  up in chair (assisted by nursing staff)    Transfers Overall transfer level: Needs assistance Equipment used: Rolling walker (2 wheeled) Transfers: Sit to/from [facility] Sit to Stand: Min guard Stand pivot transfers: Min guard       General transfer comment: increased time, labored movement  Ambulation/Gait Ambulation/Gait assistance: Min guard Gait Distance (Feet): 80 Feet Assistive device: Rolling walker (2 wheeled) Gait Pattern/deviations: Decreased step length - right;Decreased step length - left;Decreased stride length;Decreased dorsiflexion - right Gait velocity: decreased   General Gait Details: increased endurance/distance for ambulation with labored cadence, decreased right dorsiflexion, no loss of balance, limited secondary to fatigue   Stairs             Wheelchair Mobility    Modified Rankin (Stroke Patients Only)       Balance Overall balance assessment: Needs assistance Sitting-balance support: Feet supported;No upper extremity supported Sitting balance-Leahy Scale: Good Sitting balance - Comments: seated at edge of chair   Standing balance support: During functional activity;Bilateral upper extremity supported Standing balance-Leahy Scale: Fair Standing balance comment: using RW                            Cognition Arousal/Alertness: Awake/alert Behavior During Therapy: WFL for tasks assessed/performed Overall Cognitive Status: Within Functional Limits for tasks assessed                                        Exercises General Exercises - Lower Extremity Long  Arc Quad: Seated;AROM;Strengthening;Both;10 reps Hip Flexion/Marching: Seated;AROM;Strengthening;Both;10 reps Toe Raises: Seated;AROM;Strengthening;Both;15 reps Heel Raises: Seated;AROM;Strengthening;Both;15 reps    General Comments        Pertinent Vitals/Pain Pain Assessment: Faces Faces Pain Scale: Hurts a little bit Pain  Location: upper left stomach Pain Descriptors / Indicators: Aching Pain Intervention(s): Limited activity within patient's tolerance;Monitored during session    Home Living                      Prior Function            PT Goals (current goals can now be found in the care plan section) Acute Rehab PT Goals Patient Stated Goal: return home with family and home aides to assist PT Goal Formulation: With patient/family Time For Goal Achievement: 04/14/20 Potential to Achieve Goals: Good Progress towards PT goals: Progressing toward goals    Frequency    Min 3X/week      PT Plan Current plan remains appropriate    Co-evaluation              AM-PAC PT "6 Clicks" Mobility   Outcome Measure  Help needed turning from your back to your side while in a flat bed without using bedrails?: None Help needed moving from lying on your back to sitting on the side of a flat bed without using bedrails?: A Little Help needed moving to and from a bed to a chair (including a wheelchair)?: A Little Help needed standing up from a chair using your arms (e.g., wheelchair or bedside chair)?: A Little Help needed to walk in hospital room?: A Little Help needed climbing 3-5 steps with a railing? : A Lot 6 Click Score: 18    End of Session   Activity Tolerance: Patient tolerated treatment well;Patient limited by fatigue Patient left: in chair;with call bell/phone within reach Nurse Communication: Mobility status PT Visit Diagnosis: Unsteadiness on feet (R26.81);Other abnormalities of gait and mobility (R26.89);Muscle weakness (generalized) (M62.81)     Time: [id number] PT Time Calculation (min) (ACUTE ONLY): 21 min  Charges:  $Gait Training: 8-22 mins $Therapeutic Exercise: 8-22 mins                     3:48 PM, 04/01/20 Lonell Grandchild, MPT Physical Therapist with [facility] [phone] office [phone] mobile phone

## 2020-04-01 NOTE — Progress Notes (Signed)
Subjective:  Has not had a chance to eat. Drank an Ensure without worsening pain. Has some pain now, feels sore. Every morning wakes up with soreness in upper abdomen but when she eats the pain intensifies. Only gets relief if she lays down. Notes she can only eat small portions, 1/2 banana for example. BMs are regular currently. Had normal BM today. No melena, brbpr.   Objective: Vital signs in last 24 hours: Temp:  [97.3 F (36.3 C)-98.6 F (37 C)] 97.3 F (36.3 C) (03/17 1314) Pulse Rate:  [55-71] 55 (03/17 1314) Resp:  [16-18] 18 (03/17 1314) BP: (134-159)/(62-74) 134/62 (03/17 1314) SpO2:  [95 %] 95 % (03/17 1314) Last BM Date: 04/01/20 General:   Alert,  Well-developed, well-nourished, pleasant and cooperative in NAD Head:  Normocephalic and atraumatic. Eyes:  Sclera clear, no icterus.  Abdomen:  Soft,  nondistended. No masses, hepatosplenomegaly or hernias noted. Normal bowel sounds, without guarding, and without rebound.  Mild epigastric tenderness.  Extremities:  Without clubbing, deformity or edema. Neurologic:  Alert and  oriented x4;  grossly normal neurologically. Skin:  Intact without significant lesions or rashes. Psych:  Alert and cooperative. Normal mood and affect.  Intake/Output from previous day: 03/16 0701 - 03/17 0700 In: 600 [P.O.:600] Out: 450 [Urine:450] Intake/Output this shift: No intake/output data recorded.  Lab Results: CBC Recent Labs    03/30/20 0502 03/31/20 0509  WBC 4.3 5.2  HGB 11.0* 11.3*  HCT 35.0* 35.7*  MCV 96.7 97.5  PLT 277 266   BMET Recent Labs    03/30/20 0502 03/31/20 0509  NA 137 136  K 4.1 4.1  CL 106 105  CO2 22 21*  GLUCOSE 78 77  BUN 10 10  CREATININE 1.04* 1.07*  CALCIUM 8.7* 8.7*   LFTs No results for input(s): BILITOT, BILIDIR, IBILI, ALKPHOS, AST, ALT, PROT, ALBUMIN in the last 72 hours. No results for input(s): LIPASE in the last 72 hours. PT/INR No results for input(s): LABPROT, INR in the last 72  hours.    Imaging Studies: Abdomen 1 view (KUB)  Result Date: 03/24/2020 CLINICAL DATA:  History of right-sided kidney stone. EXAM: ABDOMEN - 1 VIEW COMPARISON:  Radiographs 03/10/2020.  CT today and 02/27/2020. FINDINGS: Bilateral renal calculi are visible. No suspicious calcifications are seen along the course of the ureters as correlated with today CT. The bowel gas pattern is nonobstructive. AICD and epicardial pacing leads are present. There are degenerative changes throughout the spine associated with a convex left scoliosis. IMPRESSION: Bilateral renal calculi. No acute findings identified. Electronically Signed   By: Richardean Sale M.D.   On: 03/24/2020 16:51   Abdomen 1 view (KUB)  Result Date: 03/10/2020 CLINICAL DATA:  Nephrolithiasis EXAM: ABDOMEN - 1 VIEW COMPARISON:  02/27/2020, 08/04/2013 FINDINGS: Similar bilateral mid and lower pole subcentimeter nephrolithiasis. Renal shadows are obscured by stool and gas pattern. Comparing to the prior CT, the previous known right mid ureteral calculus measuring 7 mm at the L4-5 level may still be present without significant migration. Degenerative changes scoliosis of the spine.  Bones are osteopenic. IMPRESSION: Suspect similarly positioned right mid ureteral calculus at the L4-5 level. Additional bilateral mid and lower pole nonobstructing nephrolithiasis. Electronically Signed   By: Jerilynn Mages.  Shick M.D.   On: 03/10/2020 11:28   NM Hepato W/EF  Result Date: 04/01/2020 CLINICAL DATA:  Upper abdominal pain EXAM: NUCLEAR MEDICINE HEPATOBILIARY IMAGING WITH GALLBLADDER EF VIEWS: Anterior right upper quadrant RADIOPHARMACEUTICALS:  5.2 mCi Tc-77mmebrofenin IV COMPARISON:  None.  FINDINGS: Liver uptake of radiotracer is unremarkable. There is prompt visualization of gallbladder and small bowel, indicating patency of the cystic and common bile ducts. A dose of 1.18 mcg of CCK was administered intravenously with calculation of the computer generated ejection  fraction of radiotracer from the gallbladder. Patient did not experience clinical symptoms with the CCK administration. The computer generated ejection fraction of radiotracer from the gallbladder is within normal limits at 42%, normal greater than 40%. IMPRESSION: Study within normal limits. Electronically Signed   By: Lowella Grip III M.D.   On: 04/01/2020 11:55   CT RENAL STONE STUDY  Result Date: 03/24/2020 CLINICAL DATA:  Bilateral flank pain with nausea. Diagnosed with kidney stones 3 weeks ago. EXAM: CT ABDOMEN AND PELVIS WITHOUT CONTRAST TECHNIQUE: Multidetector CT imaging of the abdomen and pelvis was performed following the standard protocol without IV contrast. COMPARISON:  CT 02/27/2020. FINDINGS: Lower chest: Emphysematous changes and subpleural reticulation at both lung bases. A right lower lobe nodule measuring 6 mm on image 34/4 is similar to prior studies dating back to 05/11/2017 and earlier, consistent with a benign etiology. Cardiac AICD leads noted. Hepatobiliary: The liver appears somewhat dense without focal abnormality. No evidence of gallstones, gallbladder wall thickening or biliary dilatation. Pancreas: 7 mm low-density lesion in the pancreatic neck on image 26/2 is unchanged. This appears cystic on prior studies dating back to 01/08/2019 and is likely a benign finding. No pancreatic ductal dilatation or surrounding inflammation. Spleen: Normal in size without focal abnormality. Adrenals/Urinary Tract: Both adrenal glands appear normal. Extensive bilateral nephrocalcinosis with multiple caliceal calculi bilaterally. The previously demonstrated right-sided hydronephrosis and mid right ureteral calculus are no longer present. Stable chronic left renal atrophy. No perinephric soft tissue stranding. The bladder appears unremarkable. Stomach/Bowel: The stomach appears unremarkable for its degree of distension. No evidence of bowel wall thickening, distention or surrounding inflammatory  change. The appendix appears normal. Vascular/Lymphatic: There are no enlarged abdominal or pelvic lymph nodes. Diffuse aortic and branch vessel atherosclerosis without evidence of aneurysm. Reproductive: Uterus and ovaries appear normal.  No adnexal mass. Other: No evidence of abdominal wall mass or hernia. No ascites. Multiple epicardial leads within the anterior abdominal wall. Musculoskeletal: No acute or significant osseous findings. Multilevel spondylosis associated with a convex left scoliosis. Stable chronic T12 and L1 superior endplate compression deformities. IMPRESSION: 1. Interval passage of previously demonstrated mid right ureteral calculus with resolved hydronephrosis. Extensive bilateral nephrocalcinosis with multiple caliceal calculi bilaterally. 2. No acute findings or explanation for the patient's symptoms. 3. Stable chronic left renal atrophy. 4. Aortic Atherosclerosis (ICD10-I70.0) and Emphysema (ICD10-J43.9). Electronically Signed   By: Richardean Sale M.D.   On: 03/24/2020 16:48   CT Angio Abd/Pel W and/or Wo Contrast  Result Date: 03/28/2020 CLINICAL DATA:  Chronic mesenteric ischemia. Right-sided abdominal pain. EXAM: CTA ABDOMEN AND PELVIS WITHOUT AND WITH CONTRAST TECHNIQUE: Multidetector CT imaging of the abdomen and pelvis was performed using the standard protocol during bolus administration of intravenous contrast. Multiplanar reconstructed images and MIPs were obtained and reviewed to evaluate the vascular anatomy. CONTRAST:  63m OMNIPAQUE IOHEXOL 350 MG/ML SOLN COMPARISON:  None. FINDINGS: VASCULAR Aorta: Aortic course and caliber are normal. There is calcific aortic atherosclerosis. Focal dissection of the infrarenal aorta is unchanged (4:81). Celiac: Patent without evidence of aneurysm, dissection, vasculitis or significant stenosis. SMA: Patent without evidence of aneurysm, dissection, vasculitis or significant stenosis. Renals: Calcification causes unchanged stenosis of the  proximal left renal artery. Right renal artery is normal. IMA:  Patent with mild narrowing at the origin. Inflow: Severe stenosis of the left internal iliac artery due to mixed density atherosclerosis, unchanged. Otherwise unremarkable. Proximal Outflow: Bilateral common femoral and visualized portions of the superficial and profunda femoral arteries are patent without evidence of aneurysm, dissection, vasculitis or significant stenosis. Veins: The main portal vein, superior mesenteric vein and splenic vein are patent. The IVC is patent. Review of the MIP images confirms the above findings. NON-VASCULAR Lower chest: No acute abnormality. Hepatobiliary: No focal liver abnormality is seen. No gallstones, gallbladder wall thickening, or biliary dilatation. Pancreas: Unremarkable. No pancreatic ductal dilatation or surrounding inflammatory changes. Spleen: Normal in size without focal abnormality. Adrenals/Urinary Tract: Unchanged appearance of the kidneys with bilateral nephrocalcinosis. No hydronephrosis. Urinary bladder is distended. Stomach/Bowel: Small hiatal hernia. No bowel wall thickening. No pneumatosis. Lymphatic: No lymphadenopathy. Reproductive: Normal uterus.  No adnexal mass. Other: None Musculoskeletal: Lumbar levoscoliosis.  No acute abnormality. IMPRESSION: 1. No evidence of acute mesenteric ischemia. 2. Unchanged appearance of focal dissection of the infrarenal aorta. 3. Unchanged severe stenosis of the left internal iliac artery. 4. Unchanged stenosis of the proximal left renal artery. Aortic Atherosclerosis (ICD10-I70.0). Electronically Signed   By: Ulyses Jarred M.D.   On: 03/28/2020 01:53   US ABDOMEN LIMITED RUQ (LIVER/GB)  Result Date: 03/28/2020 CLINICAL DATA:  Intractable abdominal pain for several weeks. EXAM: ULTRASOUND ABDOMEN LIMITED RIGHT UPPER QUADRANT COMPARISON:  None. FINDINGS: Gallbladder: No gallstones or wall thickening visualized. No sonographic Murphy sign noted by sonographer.  Common bile duct: Diameter: 3 mm Liver: Increased echogenicity with no focal mass. Portal vein is patent on color Doppler imaging with normal direction of blood flow towards the liver. Other: None. IMPRESSION: 1. No cause for pain identified. Electronically Signed   By: Dorise Bullion III M.D   On: 03/28/2020 10:34  [2 weeks]   Assessment:  87 year old female with complex history of acute on chronic epigastric pain, presenting for several week history of worsening postprandial abdominal pain and nausea.  Epigastric pain: With prior extensive evaluations to include EGD in November 2021 (nonobstructive Schatzki ring, normal stomach and duodenum), CTA November 2021 - for chronic mesenteric ischemia, ultrasound with fatty liver and no gallstones, history of kidney stone with hydronephrosis with CT renal stone study March 24, 2020 with interval passage of previously demonstrated mid right ureteral calculus with resolved hydronephrosis, 7 mm low-density lesion in the pancreatic neck appears cystic on prior studies, likely benign finding, possible IPMN could consider surveillance 2 years per guidelines.  She has been on Elavil and Bentyl in the past been unable to continue Elavil due to prolonged QTC.  More recently has been on paroxetine.  Per family had been started on Vicodin in February for kidney stones and taking scheduled about 4 times daily since her pain has been worse.   During this hospitalization, she had repeat CTA without evidence of acute mesenteric ischemia.  Ultrasound abdomen without gallstones.  Started on BuSpar 5 mg 3 times daily, Levsin every 6 hours as needed. Imdur increased from 15 to '30mg'$  today by Dr. Laural Golden for possible abdominal angina, has received one dose. HIDA scan today within normal limits with GB EF 42% and no reproduction of symptoms per se, had abdominal soreness before exam and did not worsen with CCK.  Colonoscopy this admission: Sigmoid colon and descending colon 6 mm  carpet-like polyp removed this patient is Eliquis.  Normocytic anemia: Chronic dating back at least 1 year.  Iron studies this admission normal.  Folate normal.  B12 normal.  Suspect anemia of chronic disease.  Low a.m. cortisol: A.m. cortisol level 5.3.  Will need endocrinology follow-up.  Plan: 1. Continue BuSpar and Levsin. 2. Continue pantoprazole 40 mg twice daily. 3. F/U with endocrinology for low cortisol level.  4. Encouraged oral intake. Tolerated Ensure. Has been NPO for HIDA today. Need to determine if any improvement with increased Imdur.   Laureen Ochs. Bernarda Caffey [facility] [phone] 3/17/20222:49 PM     LOS: 4 days

## 2020-04-01 NOTE — Progress Notes (Signed)
Telemetry called and said that patient HR went to 38 but that it was not sustaining. MD Olevia Bowens notified. He discontinued her Metoprolol and said to tell day shift nurse to check with Dr. Wynetta Emery before giving the amiodarone AM.

## 2020-04-01 NOTE — Plan of Care (Signed)

## 2020-04-01 NOTE — Progress Notes (Signed)
[facility] night shift telemetry coverage note.  The staff reported that the patient had several periods of bradycardia in the 30s while sleeping.  The staff woke her up and she seemed to be at her baseline.  She is on amiodarone for chronic atrial fibrillation and her metoprolol tartrate 25 mg p.o. twice daily was restarted yesterday and she has had 4 doses since then.  Although I suspect that vagal stimulation while sleeping may be playing a role, I have held the metoprolol and given the patient 1 mg of glucagon IVP.  Atropine as needed ordered in case the bradycardia becomes sustained.  Will continue close cardiac telemetry.  Tennis Must, MD.

## 2020-04-01 NOTE — Progress Notes (Signed)
Nutrition Follow-up  DOCUMENTATION CODES:   Non-severe (moderate) malnutrition in context of chronic illness  INTERVENTION:   -Once diet is advanced:  -Increase Ensure Enlive po to TID, each supplement provides 350 kcal and 20 grams of protein -Continue MVI with minerals daily -Feeding assistance with meals  NUTRITION DIAGNOSIS:   Moderate Malnutrition related to altered GI function (GI consulted- possible colonoscopy. Abdominal pain, hx of IBS.) as evidenced by mild fat depletion,moderate muscle depletion,mild muscle depletion,moderate fat depletion.  Ongoing  GOAL:   Patient will meet greater than or equal to 90% of their needs  Progressing   MONITOR:   Labs,PO intake,Diet advancement,Supplement acceptance,Weight trends  REASON FOR ASSESSMENT:   Malnutrition Screening Tool    ASSESSMENT:   Patient is an 87 yo female with hx of HTN, HLD, GERD, CAD, CKD-3, IBS and COVID-19 (December 2020). She presents with complaint of upper abdominal pain. GI consulted. Possible colonoscopy.  3/15- advanced to soft diet, s/p colonoscopy- revealed diverticulosis in sigmoid and descending colon, polyp in sigmoid colon (not removed)  Reviewed I/O's: +150 ml x 24 hours and +1.1 L since admission  UOP: 450 ml x 24 hours  Pt out of room at time of visit. Noted NPO status secondary to plan for nuclear med study today. No family present to provide further history.   Pt with fair to poor oral intake. Noted meal completions 25-50%. Pt has been consuming Ensure Enlive supplements per MAR. Will request feeding assistance with meals, as tremors and macular degeneration make it difficult for pt to feed herself.   Medications reviewed.   Labs reviewed.   Diet Order:   Diet Order            DIET SOFT Room service appropriate? Yes; Fluid consistency: Thin  Diet effective now                 EDUCATION NEEDS:   Education needs have been addressed  Skin:  Skin Assessment: Reviewed RN  Assessment  Last BM:  03/31/20  Height:   Ht Readings from Last 1 Encounters:  03/27/20 '5\' 3"'$  (1.6 m)    Weight:   Wt Readings from Last 1 Encounters:  03/28/20 58.5 kg   BMI:  Body mass index is 22.85 kg/m.  Estimated Nutritional Needs:   Kcal:  1500-1700  Protein:  75-80 gr  Fluid:  >1500 ml daily    Loistine Chance, RD, LDN, [place] Registered Dietitian II Certified Diabetes Care and Education Specialist Please refer to [facility] for RD and/or RD on-call/weekend/after hours pager

## 2020-04-02 ENCOUNTER — Ambulatory Visit: Payer: Medicare Other | Admitting: Family Medicine

## 2020-04-02 DIAGNOSIS — I482 Chronic atrial fibrillation, unspecified: Secondary | ICD-10-CM | POA: Diagnosis not present

## 2020-04-02 DIAGNOSIS — R109 Unspecified abdominal pain: Secondary | ICD-10-CM | POA: Diagnosis not present

## 2020-04-02 DIAGNOSIS — E860 Dehydration: Secondary | ICD-10-CM | POA: Diagnosis not present

## 2020-04-02 DIAGNOSIS — N1831 Chronic kidney disease, stage 3a: Secondary | ICD-10-CM | POA: Diagnosis not present

## 2020-04-02 LAB — GLUCOSE, CAPILLARY: Glucose-Capillary: 132 mg/dL — ABNORMAL HIGH (ref 70–99)

## 2020-04-02 MED ORDER — GLUCAGON HCL RDNA (DIAGNOSTIC) 1 MG IJ SOLR
1.0000 mg | Freq: Once | INTRAMUSCULAR | Status: AC
  Administered 2020-04-02: 1 mg via INTRAVENOUS
  Filled 2020-04-02: qty 1

## 2020-04-02 MED ORDER — ATROPINE SULFATE 1 MG/10ML IJ SOSY: 0.5000 mg | PREFILLED_SYRINGE | INTRAMUSCULAR | Status: DC | PRN

## 2020-04-02 NOTE — Progress Notes (Signed)
PROGRESS NOTE    [name]  [id number] DOB: [birth year] DOA: 03/27/2020 PCP: [name], DO   Brief Narrative:   [name] a 87 y.o.femalewith medical history significant foratrial fibrillation on Eliquis, CAD, HTN, HLD,AICD, CKD stage III, IBS, nephrolithiasis, arthritis, history of COVID-19 in 12/2018, and GERD with esophagitis who presented with complaintof several weeks of ongoing upper abdominal pain.Patient has been admitted for abdominal pain evaluation, but no significant findings noted on lab work or imaging is apparent.  GI has performed colonoscopy on 3/15 with noted diverticulosis in the sigmoid colon and polyp that was not removed.  Assessment & Plan:   Principal Problem:   Intractable abdominal pain Active Problems:   CKD (chronic kidney disease), stage III (HCC)   Essential hypertension   Dehydration   Hypokalemia   GERD (gastroesophageal reflux disease)   Atrial fibrillation, chronic (HCC)   Hyperlipidemia   Nausea   Hypothyroidism   Hyponatremia   Prolonged QT interval   Lactic acidosis   Elevated troponin I level  Intractable abdominal pain - has been having intermittent pain -GI feels she could have intestinal angina, increased imdur dose -HIDA scan 3/17 results within normal limits. -Pain Improved and mostly postprandial -Uncertain etiology, but a.m. cortisol noted to be low and patient will require endocrinology follow-up with ambulatory referral.  -Continue Levsin and BuSpar per GI -Colonoscopy on 3/15 without any significant findings -Seems to be tolerating soft diet  Normocytic anemia -Anemia panel without significant findings  GERD/nausea -IV Compazine as needed for nausea  Chronic hyponatremia-improved  Lactic acidosis-resolved -Likely related to dehydration  Hypothyroidism -Continue Synthroid  Chronic atrial fibrillation - having sinus bradycardia -Continue Eliquis for full  anticoagulation -Currently in NSR -Holding amiodarone and metoprolol due to severe bradycardia (HR down to 30s)  CKDIIIa-stable  Essential hypertension -Continue hydralazine and Imdur  Dyslipidemia -Continue Crestor  Status postAICD  Toe Pain - Follow up outpatient with her podiatrist Dr. Irving Shows -He is planning amputation of left first toe  DVT prophylaxis: Eliquis Code Status: DNR Family Communication: Discussed with granddaughter [place] by telephone 3/18  Disposition Plan: Home tomorrow with [facility] and hospice services if stable  Status is: Inpatient  Remains inpatient appropriate because:IV treatments appropriate due to intensity of illness or inability to take PO and Inpatient level of care appropriate due to severity of illness  Dispo: The patient is from: Home  Anticipated d/c is to: Home  Patient currently is not medically stable to d/c.              Difficult to place patient No  Consultants:   GI  Procedures:   See below  Antimicrobials:   None  Subjective: Pt had HIDA scan this morning, tolerated procedure, now wanting to eat diet. Had severe left first toe pain last night.     Objective: Vitals:   04/01/20 2140 04/01/20 2359 04/02/20 0630 04/02/20 1014  BP: (!) 156/73  (!) 130/54 139/61  Pulse: (!) 59 60 (!) 58 (!) 53  Resp: '18  18 17  '$ Temp: (!) 97.5 F (36.4 C)  98 F (36.7 C) 98.2 F (36.8 C)  TempSrc:    Oral  SpO2: 97%  95% 98%  Weight:      Height:        Intake/Output Summary (Last 24 hours) at 04/02/2020 1227 Last data filed at 04/02/2020 0900 Gross per 24 hour  Intake 600 ml  Output -  Net 600 ml   [employer]  03/27/20 2228 03/28/20 0636  Weight: 61.2 kg 58.5 kg    Examination:  General exam: Appears calm and comfortable, hard of hearing Respiratory system: Clear to auscultation. Respiratory effort normal. Cardiovascular system: normal s1, s2 sounds,  Gastrointestinal system: Abd  soft, nondistended, nontender, no masses. No HSM.  Central nervous system: no focal findings.   Extremities: no C/C/E.  Skin: No gross abnormalities seen.  Psychiatry: normal mood and behavior.   Data Reviewed: I have personally reviewed following labs and imaging studies  CBC: Recent Labs  Lab 03/27/20 2349 03/29/20 0551 03/30/20 0502 03/31/20 0509  WBC 5.0 5.2 4.3 5.2  NEUTROABS 2.5  --   --   --   HGB 11.5* 10.2* 11.0* 11.3*  HCT 35.0* 33.0* 35.0* 35.7*  MCV 92.6 97.1 96.7 97.5  PLT 318 289 277 [id number]   Basic Metabolic Panel: Recent Labs  Lab 03/27/20 2349 03/29/20 0551 03/30/20 0502 03/31/20 0509  NA 132* 137 137 136  K 3.2* 4.8 4.1 4.1  CL 97* 105 106 105  CO2 '23 24 22 '$ 21*  GLUCOSE 98 91 78 77  BUN '18 12 10 10  '$ CREATININE 1.17* 1.08* 1.04* 1.07*  CALCIUM 8.8* 8.8* 8.7* 8.7*  MG  --  1.9 2.1 2.0  PHOS  --  3.6  --   --    GFR: Estimated Creatinine Clearance: 31.2 mL/min (A) (by C-G formula based on SCr of 1.07 mg/dL (H)). Liver Function Tests: Recent Labs  Lab 03/27/20 2349 03/29/20 0551  AST 38 39  ALT 34 29  ALKPHOS 68 58  BILITOT 0.7 0.5  PROT 6.6 6.0*  ALBUMIN 3.2* 2.8*   Recent Labs  Lab 03/27/20 2349  LIPASE 33   No results for input(s): AMMONIA in the last 168 hours. Coagulation Profile: Recent Labs  Lab 03/29/20 0551  INR 1.4*   Cardiac Enzymes: No results for input(s): CKTOTAL, CKMB, CKMBINDEX, TROPONINI in the last 168 hours. BNP (last 3 results) No results for input(s): PROBNP in the last 8760 hours. HbA1C: No results for input(s): HGBA1C in the last 72 hours. CBG: Recent Labs  Lab 03/28/20 0748 03/28/20 1103 03/28/20 1636 03/28/20 2018 04/02/20 1011  GLUCAP 131* 118* 144* 98 132*   Lipid Profile: No results for input(s): CHOL, HDL, LDLCALC, TRIG, CHOLHDL, LDLDIRECT in the last 72 hours. Thyroid Function Tests: No results for input(s): TSH, T4TOTAL, FREET4, T3FREE, THYROIDAB in the last 72 hours. Anemia Panel: No  results for input(s): VITAMINB12, FOLATE, FERRITIN, TIBC, IRON, RETICCTPCT in the last 72 hours. Sepsis Labs: Recent Labs  Lab 03/27/20 2349 03/28/20 0156 03/28/20 0821 03/28/20 1036  LATICACIDVEN 2.7* 2.5* 1.3 1.0    Recent Results (from the past 240 hour(s))  Microscopic Examination     Status: Abnormal   Collection Time: 03/24/20  1:12 PM   Urine  Result Value Ref Range Status   WBC, UA 0-5 0 - 5 /hpf Final   RBC 0-2 0 - 2 /hpf Final   Epithelial Cells (non renal) >10 (A) 0 - 10 /hpf Final   Renal Epithel, UA None seen None seen /hpf Final   Bacteria, UA Few None seen/Few Final  Resp Panel by RT-PCR (Flu A&B, Covid) Nasopharyngeal Swab     Status: None   Collection Time: 03/28/20  4:16 AM   Specimen: Nasopharyngeal Swab; Nasopharyngeal(NP) swabs in vial transport medium  Result Value Ref Range Status   SARS Coronavirus 2 by RT PCR NEGATIVE NEGATIVE Final    Comment: (NOTE) SARS-CoV-2 target  nucleic acids are NOT DETECTED.  The SARS-CoV-2 RNA is generally detectable in upper respiratory specimens during the acute phase of infection. The lowest concentration of SARS-CoV-2 viral copies this assay can detect is 138 copies/mL. A negative result does not preclude SARS-Cov-2 infection and should not be used as the sole basis for treatment or other patient management decisions. A negative result may occur with  improper specimen collection/handling, submission of specimen other than nasopharyngeal swab, presence of viral mutation(s) within the areas targeted by this assay, and inadequate number of viral copies(<138 copies/mL). A negative result must be combined with clinical observations, patient history, and epidemiological information. The expected result is Negative.  Fact Sheet for Patients:  EntrepreneurPulse.com.au  Fact Sheet for Healthcare Providers:  IncredibleEmployment.be  This test is no t yet approved or cleared by the [place] FDA and  has been authorized for detection and/or diagnosis of SARS-CoV-2 by FDA under an Emergency Use Authorization (EUA). This EUA will remain  in effect (meaning this test can be used) for the duration of the COVID-19 declaration under Section 564(b)(1) of the Act, 21 U.S.C.section 360bbb-3(b)(1), unless the authorization is terminated  or revoked sooner.       Influenza A by PCR NEGATIVE NEGATIVE Final   Influenza B by PCR NEGATIVE NEGATIVE Final    Comment: (NOTE) The Xpert Xpress SARS-CoV-2/FLU/RSV plus assay is intended as an aid in the diagnosis of influenza from Nasopharyngeal swab specimens and should not be used as a sole basis for treatment. Nasal washings and aspirates are unacceptable for Xpert Xpress SARS-CoV-2/FLU/RSV testing.  Fact Sheet for Patients: EntrepreneurPulse.com.au  Fact Sheet for Healthcare Providers: IncredibleEmployment.be  This test is not yet approved or cleared by the [place] FDA and has been authorized for detection and/or diagnosis of SARS-CoV-2 by FDA under an Emergency Use Authorization (EUA). This EUA will remain in effect (meaning this test can be used) for the duration of the COVID-19 declaration under Section 564(b)(1) of the Act, 21 U.S.C. section 360bbb-3(b)(1), unless the authorization is terminated or revoked.  Performed at [facility], [address] [place], [place] [address]     Radiology Studies: NM Hepato W/EF  Result Date: 04/01/2020 CLINICAL DATA:  Upper abdominal pain EXAM: NUCLEAR MEDICINE HEPATOBILIARY IMAGING WITH GALLBLADDER EF VIEWS: Anterior right upper quadrant RADIOPHARMACEUTICALS:  5.2 mCi Tc-55mmebrofenin IV COMPARISON:  None. FINDINGS: Liver uptake of radiotracer is unremarkable. There is prompt visualization of gallbladder and small bowel, indicating patency of the cystic and common bile ducts. A dose of 1.18 mcg of CCK was administered intravenously with  calculation of the computer generated ejection fraction of radiotracer from the gallbladder. Patient did not experience clinical symptoms with the CCK administration. The computer generated ejection fraction of radiotracer from the gallbladder is within normal limits at 42%, normal greater than 40%. IMPRESSION: Study within normal limits. Electronically Signed   By: WLowella GripIII M.D.   On: 04/01/2020 11:55   Scheduled Meds: . apixaban  2.5 mg Oral BID  . busPIRone  5 mg Oral TID  . cholecalciferol  2,000 Units Oral Daily  . feeding supplement  237 mL Oral TID BM  . hydrALAZINE  25 mg Oral TID  . isosorbide mononitrate  30 mg Oral Daily  . levothyroxine  100 mcg Oral Daily  . multivitamin with minerals  1 tablet Oral Daily  . pantoprazole  40 mg Oral BID AC  . rosuvastatin  10 mg Oral Daily  . tamsulosin  0.4 mg  Oral QPC supper    LOS: 5 days   Time spent: 35 minutes  Clanford Wynetta Emery, MD How to contact the [facility] Attending or Consulting provider [place] or covering provider during after hours [place], for this patient?  1. Check the care team in [facility] and look for a) attending/consulting TRH provider listed and b) the [facility] team listed 2. Log into [email or web address] and use [place]'s universal password to access. If you do not have the password, please contact the hospital operator. 3. Locate the [facility] provider you are looking for under [facility] and page to a number that you can be directly reached. 4. If you still have difficulty reaching the provider, please page the [facility] (Director on Call) for the Hospitalists listed on amion for assistance.   If 7PM-7AM, please contact night-coverage [email or web address] 04/02/2020, 12:27 PM

## 2020-04-02 NOTE — Progress Notes (Addendum)
Subjective: Continues with abdominal pain. Had a "bad episode" when eating after HIDA scan yesterday. Some abdominal pain this morning, across the mid-abdomen. Denies N/V. Moving bowel well with 2-3 stools a day. No other GI complaints.  Objective: Vital signs in last 24 hours: Temp:  [97.3 F (36.3 C)-98 F (36.7 C)] 98 F (36.7 C) (03/18 0630) Pulse Rate:  [55-60] 58 (03/18 0630) Resp:  [18] 18 (03/18 0630) BP: (130-156)/(54-73) 130/54 (03/18 0630) SpO2:  [95 %-97 %] 95 % (03/18 0630) Last BM Date: 04/01/20 General:   Alert and oriented, pleasant Head:  Normocephalic and atraumatic. Neck:  Supple, without thyromegaly or masses.  Heart:  S1, S2 present, no murmurs noted.  Lungs: Clear to auscultation bilaterally, without wheezing, rales, or rhonchi.  Abdomen:  Bowel sounds present, soft, non-distended. TTP mid-abdomen. No HSM or hernias noted. No rebound or guarding. No masses appreciated  Msk:  Symmetrical without gross deformities. Pulses:  Normal bilateral DP pulses noted. Extremities:  Without clubbing or edema. Neurologic:  Alert and  oriented x4;  grossly normal neurologically. Psych:  Alert and cooperative. Normal mood and affect.  Intake/Output from previous day: 03/17 0701 - 03/18 0700 In: 360 [P.O.:360] Out: -  Intake/Output this shift: Total I/O In: 240 [P.O.:240] Out: -   Lab Results: Recent Labs    03/31/20 0509  WBC 5.2  HGB 11.3*  HCT 35.7*  PLT 266   BMET Recent Labs    03/31/20 0509  NA 136  K 4.1  CL 105  CO2 21*  GLUCOSE 77  BUN 10  CREATININE 1.07*  CALCIUM 8.7*   LFT No results for input(s): PROT, ALBUMIN, AST, ALT, ALKPHOS, BILITOT, BILIDIR, IBILI in the last 72 hours. PT/INR No results for input(s): LABPROT, INR in the last 72 hours. Hepatitis Panel No results for input(s): HEPBSAG, HCVAB, HEPAIGM, HEPBIGM in the last 72 hours.   Studies/Results: NM Hepato W/EF  Result Date: 04/01/2020 CLINICAL DATA:  Upper abdominal  pain EXAM: NUCLEAR MEDICINE HEPATOBILIARY IMAGING WITH GALLBLADDER EF VIEWS: Anterior right upper quadrant RADIOPHARMACEUTICALS:  5.2 mCi Tc-34mmebrofenin IV COMPARISON:  None. FINDINGS: Liver uptake of radiotracer is unremarkable. There is prompt visualization of gallbladder and small bowel, indicating patency of the cystic and common bile ducts. A dose of 1.18 mcg of CCK was administered intravenously with calculation of the computer generated ejection fraction of radiotracer from the gallbladder. Patient did not experience clinical symptoms with the CCK administration. The computer generated ejection fraction of radiotracer from the gallbladder is within normal limits at 42%, normal greater than 40%. IMPRESSION: Study within normal limits. Electronically Signed   By: WLowella GripIII M.D.   On: 04/01/2020 11:55    Assessment: 87year old female with complex history of acute on chronic epigastric pain, presenting for several week history of worsening postprandial abdominal pain and nausea.  Epigastric pain: With prior extensive evaluations to include EGD in November 2021 (nonobstructive Schatzki ring, normal stomach and duodenum), CTA November 2021 - for chronic mesenteric ischemia, ultrasound with fatty liver and no gallstones, history of kidney stone with hydronephrosis with CT renal stone study March 24, 2020 with interval passage of previously demonstrated mid right ureteral calculus with resolved hydronephrosis, 7 mm low-density lesion in the pancreatic neck appears cystic on prior studies, likely benign finding, possible IPMN could consider surveillance 2 years per guidelines.  She has been on Elavil and Bentyl in the past been unable to continue Elavil due to prolonged QTC.  More recently has  been on paroxetine.  Per family had been started on Vicodin in February for kidney stones and taking scheduled about 4 times daily since her pain has been worse.   During this hospitalization, she had  repeat CTA without evidence of acute mesenteric ischemia.  Ultrasound abdomen without gallstones.  Started on BuSpar 5 mg 3 times daily, Levsin every 6 hours as needed.HIDA scan yesterday within normal limits with GB EF 42% and no reproduction of symptoms per se, had abdominal soreness before exam and did not worsen with CCK.  Imdur increased from 15 to '30mg'$  yesterday by Dr. Laural Golden for possible abdominal angina. Still with abdominal pain today, but she has only received one dose of increased strength.   Colonoscopy this admission: Sigmoid colon and descending colon 6 mm carpet-like polyp removed this patient is Eliquis.  Normocytic anemia: Chronic dating back at least 1 year.  Iron studies this admission normal.  Folate normal.  B12 normal.  Suspect anemia of chronic disease.  Low a.m. cortisol: A.m. cortisol level 5.3.  Will need endocrinology follow-up.   Plan: 1. Continue BuSpar, Levsin, Pantoprazole (bid) 2. Plan outpatient endocrine follow-up for low cortisol 3. Monitor pain for improvement over the next 24-48 hours 4. Supportive measures   Thank you for allowing [place] to participate in the care of Leonia Corona, DNP, AGNP-C Adult & Gerontological Nurse Practitioner [facility]    LOS: 5 days    04/02/2020, 9:42 AM

## 2020-04-02 NOTE — TOC Transition Note (Signed)
Transition of Care [facility]) - CM/SW Discharge Note   Patient Details  Name: [name] MRN: [id number] Date of Birth: 22-Nov-1931  [facility] CM/SW Contact:  Ihor Gully, LCSW Phone Number: 04/02/2020, 11:14 AM   Clinical Narrative:    Referred to [facility] for [facility] services.    Final next level of care: Home w Hospice Care Barriers to Discharge: No Barriers Identified   Patient Goals and CMS Choice Patient states their goals for this hospitalization and ongoing recovery are:: Return home CMS Medicare.gov Compare Post Acute Care list provided to:: Patient Choice offered to / list presented to : Patient  Discharge Placement                       Discharge Plan and Services     Post Acute Care Choice: NA          DME Arranged: N/A DME Agency: NA       HH Arranged: RN,PT,OT,Nurse's Aide,Social Work [employer] Agency: Microbiologist ([place]) Date [place]: 04/02/20 Time [place]: 1114 Representative spoke with at [place]: [place] ([place]) Interventions     Readmission Risk Interventions Readmission Risk Prevention Plan 03/28/2020 11/17/2019  Transportation Screening Complete Complete  Medication Review Press photographer) Complete Complete  PCP or Specialist appointment within 3-5 days of discharge - Complete  HRI or [place] Complete Complete  SW Recovery Care/Counseling Consult Complete Complete  Palliative Care Screening Not Applicable Not [place] Not Applicable Not Applicable  Some recent data might be hidden

## 2020-04-02 NOTE — Care Management Important Message (Signed)
Important Message  Patient Details  Name: [name] MRN: [id number] Date of Birth: [birth year]   Medicare Important Message Given:  Yes     Tommy Medal 04/02/2020, 12:27 PM

## 2020-04-03 DIAGNOSIS — N1831 Chronic kidney disease, stage 3a: Secondary | ICD-10-CM | POA: Diagnosis not present

## 2020-04-03 DIAGNOSIS — E860 Dehydration: Secondary | ICD-10-CM | POA: Diagnosis not present

## 2020-04-03 DIAGNOSIS — I482 Chronic atrial fibrillation, unspecified: Secondary | ICD-10-CM | POA: Diagnosis not present

## 2020-04-03 DIAGNOSIS — R109 Unspecified abdominal pain: Secondary | ICD-10-CM | POA: Diagnosis not present

## 2020-04-03 NOTE — Progress Notes (Signed)
PROGRESS NOTE    [name]  [id number] DOB: Sep 19, 1931 DOA: 03/27/2020 PCP: [name], DO   Brief Narrative:   [name] a 87 y.o.femalewith medical history significant foratrial fibrillation on Eliquis, CAD, HTN, HLD,AICD, CKD stage III, IBS, nephrolithiasis, arthritis, history of COVID-19 in 12/2018, and GERD with esophagitis who presented with complaintof several weeks of ongoing upper abdominal pain.Patient has been admitted for abdominal pain evaluation, but no significant findings noted on lab work or imaging is apparent.  GI has performed colonoscopy on 3/15 with noted diverticulosis in the sigmoid colon and polyp that was not removed.  Assessment & Plan:   Principal Problem:   Intractable abdominal pain Active Problems:   CKD (chronic kidney disease), stage III (HCC)   Essential hypertension   Dehydration   Hypokalemia   GERD (gastroesophageal reflux disease)   Atrial fibrillation, chronic (HCC)   Hyperlipidemia   Nausea   Hypothyroidism   Hyponatremia   Prolonged QT interval   Lactic acidosis   Elevated troponin I level  Postprandial abdominal pain - has been having intermittent severe pain after eating meals -GI feels she could have intestinal angina, increased imdur dose but pain persists -HIDA scan 3/17 results within normal limits. -Pain Improved and mostly postprandial -Uncertain etiology, but a.m. cortisol noted to be low and patient will require endocrinology follow-up with ambulatory referral.  -Continue Levsin and BuSpar per GI -Colonoscopy on 3/15 without any significant findings -Unfortunately postprandial pain persists, continue full liquid diet for now -Did not increase imdur further due to soft BPs.   Normocytic anemia -Anemia panel without significant findings  GERD/nausea -IV Compazine as needed for nausea  Chronic hyponatremia-improved  Lactic acidosis-resolved -Likely related to  dehydration  Hypothyroidism -Continue Synthroid  Chronic atrial fibrillation - having sinus bradycardia -Continue Eliquis for full anticoagulation -Currently in NSR -Holding amiodarone and metoprolol due to severe bradycardia (HR down to 30s)  CKDIIIa-stable  Essential hypertension -Continue hydralazine and Imdur  Dyslipidemia -Continue Crestor  Status postAICD  Toe Pain - Follow up outpatient with her podiatrist Dr. Irving Shows -He is planning amputation of left first toe  DVT prophylaxis: Eliquis Code Status: DNR Family Communication: Discussed with granddaughter [place] by telephone 3/18, 3/19 Disposition Plan: Home with [facility] Status is: Inpatient  Remains inpatient appropriate because:IV treatments appropriate due to intensity of illness or inability to take PO and Inpatient level of care appropriate due to severity of illness  Dispo: The patient is from: Home  Anticipated d/c is to: Home with [facility]  Patient currently is not medically stable to d/c.              Difficult to place patient No  Consultants:   GI  Procedures:   See below  Antimicrobials:   None  Subjective: Pt continues to have postprandial abdominal pain with meals.  Only able to eat very small amounts.      Objective: Vitals:   04/02/20 1317 04/02/20 1538 04/02/20 2112 04/03/20 0543  BP: (!) 106/50 (!) 94/53 (!) 104/50 (!) 122/57  Pulse: (!) 52 (!) 52 (!) 52 (!) 53  Resp: '17  20 18  '$ Temp: 97.7 F (36.5 C)  97.7 F (36.5 C) 98.1 F (36.7 C)  TempSrc: Oral  Oral Oral  SpO2: 95%  98% 95%  Weight:      Height:        Intake/Output Summary (Last 24 hours) at 04/03/2020 1531 Last data filed at 04/03/2020 0900 Gross per 24 hour  Intake  360 ml  Output --  Net 360 ml   Filed Weights   03/27/20 2228 03/28/20 0636  Weight: 61.2 kg 58.5 kg    Examination:  General exam: Appears calm and comfortable, hard of hearing Respiratory system: Clear to  auscultation. Respiratory effort normal. Cardiovascular system: normal s1, s2 sounds,  Gastrointestinal system: Abd soft, nondistended, mild RUQ tenderness with deep palpation, neg Murphy sign, no masses. No HSM.  Central nervous system: no focal findings.   Extremities: no C/C/E.  Skin: No gross abnormalities seen.  Psychiatry: normal mood and behavior.   Data Reviewed: I have personally reviewed following labs and imaging studies  CBC: Recent Labs  Lab 03/27/20 2349 03/29/20 0551 03/30/20 0502 03/31/20 0509  WBC 5.0 5.2 4.3 5.2  NEUTROABS 2.5  --   --   --   HGB 11.5* 10.2* 11.0* 11.3*  HCT 35.0* 33.0* 35.0* 35.7*  MCV 92.6 97.1 96.7 97.5  PLT 318 289 277 [id number]   Basic Metabolic Panel: Recent Labs  Lab 03/27/20 2349 03/29/20 0551 03/30/20 0502 03/31/20 0509  NA 132* 137 137 136  K 3.2* 4.8 4.1 4.1  CL 97* 105 106 105  CO2 '23 24 22 '$ 21*  GLUCOSE 98 91 78 77  BUN '18 12 10 10  '$ CREATININE 1.17* 1.08* 1.04* 1.07*  CALCIUM 8.8* 8.8* 8.7* 8.7*  MG  --  1.9 2.1 2.0  PHOS  --  3.6  --   --    GFR: Estimated Creatinine Clearance: 31.2 mL/min (A) (by C-G formula based on SCr of 1.07 mg/dL (H)). Liver Function Tests: Recent Labs  Lab 03/27/20 2349 03/29/20 0551  AST 38 39  ALT 34 29  ALKPHOS 68 58  BILITOT 0.7 0.5  PROT 6.6 6.0*  ALBUMIN 3.2* 2.8*   Recent Labs  Lab 03/27/20 2349  LIPASE 33   No results for input(s): AMMONIA in the last 168 hours. Coagulation Profile: Recent Labs  Lab 03/29/20 0551  INR 1.4*   Cardiac Enzymes: No results for input(s): CKTOTAL, CKMB, CKMBINDEX, TROPONINI in the last 168 hours. BNP (last 3 results) No results for input(s): PROBNP in the last 8760 hours. HbA1C: No results for input(s): HGBA1C in the last 72 hours. CBG: Recent Labs  Lab 03/28/20 0748 03/28/20 1103 03/28/20 1636 03/28/20 2018 04/02/20 1011  GLUCAP 131* 118* 144* 98 132*   Lipid Profile: No results for input(s): CHOL, HDL, LDLCALC, TRIG, CHOLHDL,  LDLDIRECT in the last 72 hours. Thyroid Function Tests: No results for input(s): TSH, T4TOTAL, FREET4, T3FREE, THYROIDAB in the last 72 hours. Anemia Panel: No results for input(s): VITAMINB12, FOLATE, FERRITIN, TIBC, IRON, RETICCTPCT in the last 72 hours. Sepsis Labs: Recent Labs  Lab 03/27/20 2349 03/28/20 0156 03/28/20 0821 03/28/20 1036  LATICACIDVEN 2.7* 2.5* 1.3 1.0    Recent Results (from the past 240 hour(s))  Resp Panel by RT-PCR (Flu A&B, Covid) Nasopharyngeal Swab     Status: None   Collection Time: 03/28/20  4:16 AM   Specimen: Nasopharyngeal Swab; Nasopharyngeal(NP) swabs in vial transport medium  Result Value Ref Range Status   SARS Coronavirus 2 by RT PCR NEGATIVE NEGATIVE Final    Comment: (NOTE) SARS-CoV-2 target nucleic acids are NOT DETECTED.  The SARS-CoV-2 RNA is generally detectable in upper respiratory specimens during the acute phase of infection. The lowest concentration of SARS-CoV-2 viral copies this assay can detect is 138 copies/mL. A negative result does not preclude SARS-Cov-2 infection and should not be used as the sole basis for  treatment or other patient management decisions. A negative result may occur with  improper specimen collection/handling, submission of specimen other than nasopharyngeal swab, presence of viral mutation(s) within the areas targeted by this assay, and inadequate number of viral copies(<138 copies/mL). A negative result must be combined with clinical observations, patient history, and epidemiological information. The expected result is Negative.  Fact Sheet for Patients:  EntrepreneurPulse.com.au  Fact Sheet for Healthcare Providers:  IncredibleEmployment.be  This test is no t yet approved or cleared by the [place] FDA and  has been authorized for detection and/or diagnosis of SARS-CoV-2 by FDA under an Emergency Use Authorization (EUA). This EUA will remain  in effect  (meaning this test can be used) for the duration of the COVID-19 declaration under Section 564(b)(1) of the Act, 21 U.S.C.section 360bbb-3(b)(1), unless the authorization is terminated  or revoked sooner.       Influenza A by PCR NEGATIVE NEGATIVE Final   Influenza B by PCR NEGATIVE NEGATIVE Final    Comment: (NOTE) The Xpert Xpress SARS-CoV-2/FLU/RSV plus assay is intended as an aid in the diagnosis of influenza from Nasopharyngeal swab specimens and should not be used as a sole basis for treatment. Nasal washings and aspirates are unacceptable for Xpert Xpress SARS-CoV-2/FLU/RSV testing.  Fact Sheet for Patients: EntrepreneurPulse.com.au  Fact Sheet for Healthcare Providers: IncredibleEmployment.be  This test is not yet approved or cleared by the [place] FDA and has been authorized for detection and/or diagnosis of SARS-CoV-2 by FDA under an Emergency Use Authorization (EUA). This EUA will remain in effect (meaning this test can be used) for the duration of the COVID-19 declaration under Section 564(b)(1) of the Act, 21 U.S.C. section 360bbb-3(b)(1), unless the authorization is terminated or revoked.  Performed at [facility], [address], [place], [place] [address]     Radiology Studies: No results found. Scheduled Meds: . apixaban  2.5 mg Oral BID  . cholecalciferol  2,000 Units Oral Daily  . feeding supplement  237 mL Oral TID BM  . isosorbide mononitrate  30 mg Oral Daily  . levothyroxine  100 mcg Oral Daily  . multivitamin with minerals  1 tablet Oral Daily  . pantoprazole  40 mg Oral BID AC  . rosuvastatin  10 mg Oral Daily  . tamsulosin  0.4 mg Oral QPC supper    LOS: 6 days   Time spent: 35 minutes  Baldwin Racicot Wynetta Emery, MD How to contact the [facility] Attending or Consulting provider [place] or covering provider during after hours [place], for this patient?  1. Check the care team in [facility] and look for a)  attending/consulting TRH provider listed and b) the [facility] team listed 2. Log into [email or web address] and use [place]'s universal password to access. If you do not have the password, please contact the hospital operator. 3. Locate the [facility] provider you are looking for under [facility] and page to a number that you can be directly reached. 4. If you still have difficulty reaching the provider, please page the [facility] (Director on Call) for the Hospitalists listed on amion for assistance.   If 7PM-7AM, please contact night-coverage [email or web address] 04/03/2020, 3:31 PM

## 2020-04-04 DIAGNOSIS — E860 Dehydration: Secondary | ICD-10-CM | POA: Diagnosis not present

## 2020-04-04 DIAGNOSIS — I482 Chronic atrial fibrillation, unspecified: Secondary | ICD-10-CM | POA: Diagnosis not present

## 2020-04-04 DIAGNOSIS — R109 Unspecified abdominal pain: Secondary | ICD-10-CM | POA: Diagnosis not present

## 2020-04-04 DIAGNOSIS — N1831 Chronic kidney disease, stage 3a: Secondary | ICD-10-CM | POA: Diagnosis not present

## 2020-04-04 LAB — CBC WITH DIFFERENTIAL/PLATELET
Abs Immature Granulocytes: 0.03 10*3/uL (ref 0.00–0.07)
Basophils Absolute: 0 10*3/uL (ref 0.0–0.1)
Basophils Relative: 1 %
Eosinophils Absolute: 0.6 10*3/uL — ABNORMAL HIGH (ref 0.0–0.5)
Eosinophils Relative: 11 %
HCT: 31.2 % — ABNORMAL LOW (ref 36.0–46.0)
Hemoglobin: 10.1 g/dL — ABNORMAL LOW (ref 12.0–15.0)
Immature Granulocytes: 1 %
Lymphocytes Relative: 25 %
Lymphs Abs: 1.4 10*3/uL (ref 0.7–4.0)
MCH: 30.6 pg (ref 26.0–34.0)
MCHC: 32.4 g/dL (ref 30.0–36.0)
MCV: 94.5 fL (ref 80.0–100.0)
Monocytes Absolute: 0.6 10*3/uL (ref 0.1–1.0)
Monocytes Relative: 10 %
Neutro Abs: 2.9 10*3/uL (ref 1.7–7.7)
Neutrophils Relative %: 52 %
Platelets: 243 10*3/uL (ref 150–400)
RBC: 3.3 MIL/uL — ABNORMAL LOW (ref 3.87–5.11)
RDW: 14.6 % (ref 11.5–15.5)
WBC: 5.6 10*3/uL (ref 4.0–10.5)
nRBC: 0 % (ref 0.0–0.2)

## 2020-04-04 LAB — MAGNESIUM: Magnesium: 1.9 mg/dL (ref 1.7–2.4)

## 2020-04-04 LAB — BASIC METABOLIC PANEL
Anion gap: 6 (ref 5–15)
BUN: 29 mg/dL — ABNORMAL HIGH (ref 8–23)
CO2: 23 mmol/L (ref 22–32)
Calcium: 9 mg/dL (ref 8.9–10.3)
Chloride: 103 mmol/L (ref 98–111)
Creatinine, Ser: 1.18 mg/dL — ABNORMAL HIGH (ref 0.44–1.00)
GFR, Estimated: 45 mL/min — ABNORMAL LOW (ref >60)
Glucose, Bld: 111 mg/dL — ABNORMAL HIGH (ref 70–99)
Potassium: 3.1 mmol/L — ABNORMAL LOW (ref 3.5–5.1)
Sodium: 132 mmol/L — ABNORMAL LOW (ref 135–145)

## 2020-04-04 MED ORDER — POTASSIUM CHLORIDE CRYS ER 20 MEQ PO TBCR
40.0000 meq | EXTENDED_RELEASE_TABLET | Freq: Once | ORAL | Status: AC
  Administered 2020-04-04: 40 meq via ORAL
  Filled 2020-04-04: qty 2

## 2020-04-04 NOTE — NC FL2 (Signed)
[place] LEVEL OF CARE SCREENING TOOL     IDENTIFICATION  Patient Name: [name] Birthdate: 07-03-1931 Sex: female Admission Date (Current Location): 03/27/2020  [facility] and [place] Number:  [employer] and Address:  [facility] [address] [place]      Provider Number: [id number]  Attending Physician Name and Address:  Murlean Iba, MD  Relative Name and Phone Number:  Lourdes, Teachout and Hilda Blades (Son)   [phone]    Current Level of Care: Hospital Recommended Level of Care: [place] Prior Approval Number:    Date Approved/Denied: 04/04/20 PASRR Number: [id number]  Discharge Plan: SNF    Current Diagnoses: Patient Active Problem List   Diagnosis Date Noted  . Intractable abdominal pain 03/28/2020  . Lactic acidosis 03/28/2020  . Elevated troponin I level 03/28/2020  . Malnutrition of moderate degree 12/26/2019  . Prolonged QT interval 12/23/2019  . Acute lower UTI 12/23/2019  . Constipation 11/27/2019  . Diarrhea 11/27/2019  . Lesion of pancreas 11/26/2019  . Fibromuscular dysplasia of right renal artery ([place]) 11/26/2019  . Left renal artery stenosis ([place]) 11/26/2019  . RUQ pain   . Abnormal CT of the abdomen   . Vertigo 11/15/2019  . Advanced care planning/counseling discussion 11/15/2019  . CAD (coronary artery disease)   . Hypothyroidism   . Hyponatremia   . Volume depletion   . TSH elevation 08/01/2019  . Hospital discharge follow-up 08/01/2019  . Exudative age-related macular degeneration of right eye with active choroidal neovascularization ([place]) 04/30/2019  . Retinal hemorrhage of right eye 04/30/2019  . Advanced nonexudative age-related macular degeneration of left eye with subfoveal involvement 04/30/2019  . Exudative age-related macular degeneration of left eye with inactive choroidal neovascularization ([place]) 04/30/2019  . Advanced nonexudative age-related macular  degeneration of right eye without subfoveal involvement 04/30/2019  . Posterior vitreous detachment of both eyes 04/30/2019  . Secondary hypercoagulable state ([place]) 03/11/2019  . Persistent atrial fibrillation ([place])   . Proctitis 01/09/2019  . Atrial fibrillation, chronic ([place]) 01/09/2019  . Hyperlipidemia 01/09/2019  . Nausea 01/09/2019  . Hyperglycemia 01/09/2019  . COVID-19 virus infection 12/28/2018  . Chronic diarrhea 09/18/2018  . Abdominal pain 09/18/2018  . Osteoarthritis of right knee 03/29/2018  . GERD (gastroesophageal reflux disease)   . Dehydration 02/06/2018  . Hypokalemia 02/06/2018  . Diarrhea of presumed infectious origin 02/06/2018  . Acute kidney injury superimposed on CKD ([place]) 02/05/2018  . Essential hypertension 12/21/2017  . Degenerative scoliosis 02/19/2017  . Burst fracture of lumbar vertebra ([place]) 02/19/2017  . History of drug-induced prolonged QT interval with torsade de pointes 11/01/2016  . Iron deficiency anemia 10/30/2016  . Long term current use of anticoagulant 02/17/2016  . Near syncope 05/22/2015  . Diabetes mellitus type 2, diet-controlled ([place]) 09/14/2014  . ICD (implantable cardioverter-defibrillator) battery depletion 01/20/2014  . Abnormal nuclear stress test, 12/30/13 12/31/2013  . Coronary artery disease involving native coronary artery of native heart without angina pectoris 12/31/2013  . Paroxysmal atrial fibrillation, chads2 Vasc2 score of 5, on eliquis 10/16/2013  . Catecholaminergic polymorphic ventricular tachycardia ([place]) 10/16/2013  . Ureteral stone with hydronephrosis 05/20/2013  . Metabolic syndrome [id number]  . Orthostatic hypotension 07/27/2011  . Chest pain 07/25/2011  . Weakness 07/25/2011  . Chronic sinus bradycardia 07/25/2011  . ICD (implantable cardioverter-defibrillator) in place 07/25/2011  . CKD (chronic kidney disease), stage III ([place]) 07/25/2011  . History of DVT in the past, on eliquis now 07/25/2011  .  Nephrolithiasis, just saw Dr [name] 07/25/2011  . DYSPHAGIA 03/24/2010  . Chronic ischemic heart disease 12/03/2007  . Irritable bowel syndrome with diarrhea 12/03/2007  . EPIGASTRIC PAIN 12/03/2007  . Hyperlipidemia associated with type 2 diabetes mellitus ([place]) 12/02/2007  . EXTERNAL HEMORRHOIDS 12/02/2007  . ESOPHAGITIS 12/02/2007  . GASTRITIS 12/02/2007  . DIVERTICULOSIS, COLON 12/02/2007  . ARTHRITIS 12/02/2007    Orientation RESPIRATION BLADDER Height & Weight     Self,Time,Situation,Place  Normal Incontinent Weight: 128 lb 15.5 oz (58.5 kg) Height:  '5\' 3"'$  (160 cm)  BEHAVIORAL SYMPTOMS/MOOD NEUROLOGICAL BOWEL NUTRITION STATUS      Continent Diet (DIET SOFT Room service appropriate? Yes; Fluid consistency: Thin)  AMBULATORY STATUS COMMUNICATION OF NEEDS Skin   Limited Assist Verbally Normal                       Personal Care Assistance Level of Assistance  Bathing,Feeding,Dressing Bathing Assistance: Limited assistance Feeding assistance: Limited assistance Dressing Assistance: Limited assistance     Functional Limitations Info  Sight,Hearing,Speech Sight Info: Adequate Hearing Info: Impaired Speech Info: Adequate    SPECIAL CARE FACTORS FREQUENCY  PT (By licensed PT)     PT Frequency: 5x              Contractures Contractures Info: Not present    Additional Factors Info  Code Status,Allergies Code Status Info: Full Allergies Info: Sotalol  Tramadol  Actos (pioglitazone)  Adhesive (tape)  Atorvastatin  Bactrim (sulfamethoxazole-trimethoprim)  Ciprofloxacin  Contrast Media (iodinated Diagnostic Agents)  Latex  Pedi-pre Tape Spray (wound Dressing Adhesive)  Sulfa Antibiotics           Current Medications (04/04/2020):  This is the current hospital active medication list Current Facility-Administered Medications  Medication Dose Route Frequency Provider Last Rate Last Admin  . apixaban (ELIQUIS) tablet 2.5 mg  2.5 mg Oral BID Adefeso, Oladapo,  DO   2.5 mg at 04/04/20 0958  . atropine 1 MG/10ML injection 0.5 mg  0.5 mg Intravenous PRN Reubin Milan, MD      . cholecalciferol (VITAMIN D3) tablet 2,000 Units  2,000 Units Oral Daily Murlean Iba, MD   2,000 Units at 04/04/20 [phone]  . feeding supplement (ENSURE ENLIVE / ENSURE PLUS) liquid 237 mL  237 mL Oral TID BM Johnson, Clanford L, MD   237 mL at 04/03/20 2054  . hydrALAZINE (APRESOLINE) injection 10 mg  10 mg Intravenous Once PRN Adefeso, Oladapo, DO      . HYDROcodone-acetaminophen (NORCO/VICODIN) 5-325 MG per tablet 1 tablet  1 tablet Oral Q4H PRN Murlean Iba, MD   1 tablet at 04/03/20 2155  . HYDROmorphone (DILAUDID) injection 0.25 mg  0.25 mg Intravenous Q3H PRN Wynetta Emery, Clanford L, MD   0.25 mg at 04/01/20 2040  . isosorbide mononitrate (IMDUR) 24 hr tablet 30 mg  30 mg Oral Daily Rehman, Najeeb U, MD   30 mg at 04/04/20 1001  . levothyroxine (SYNTHROID) tablet 100 mcg  100 mcg Oral Daily Adefeso, Oladapo, DO   100 mcg at 04/04/20 0552  . meclizine (ANTIVERT) tablet 25 mg  25 mg Oral TID PRN Wynetta Emery, Clanford L, MD   25 mg at 04/02/20 1331  . multivitamin with minerals tablet 1 tablet  1 tablet Oral Daily Wynetta Emery, Clanford L, MD   1 tablet at 04/04/20 0958  . pantoprazole (PROTONIX) EC tablet 40 mg  40 mg Oral BID AC Annitta Needs, NP   40 mg at 04/04/20 [id number]  .  prochlorperazine (COMPAZINE) injection 10 mg  10 mg Intravenous Q6H PRN Adefeso, Oladapo, DO      . rosuvastatin (CRESTOR) tablet 10 mg  10 mg Oral Daily Adefeso, Oladapo, DO   10 mg at 04/04/20 0958  . tamsulosin (FLOMAX) capsule 0.4 mg  0.4 mg Oral QPC supper Wynetta Emery, Clanford L, MD   0.4 mg at 04/03/20 1737     Discharge Medications: Please see discharge summary for a list of discharge medications.  Relevant Imaging Results:  Relevant Lab Results:   Additional Information Pt SSN: [id number]  Natasha Bence, LCSW

## 2020-04-04 NOTE — Progress Notes (Signed)
PROGRESS NOTE    [name]  [id number] DOB: 1931-11-11 DOA: 03/27/2020 PCP: Renee Norlander, DO   Brief Narrative:   [name] a 87 y.o.femalewith medical history significant foratrial fibrillation on Eliquis, CAD, HTN, HLD,AICD, CKD stage III, IBS, nephrolithiasis, arthritis, history of COVID-19 in 12/2018, and GERD with esophagitis who presented with complaintof several weeks of ongoing upper abdominal pain.Patient has been admitted for abdominal pain evaluation, but no significant findings noted on lab work or imaging is apparent.  GI has performed colonoscopy on 3/15 with noted diverticulosis in the sigmoid colon and polyp that was not removed.  Assessment & Plan:   Principal Problem:   Intractable abdominal pain Active Problems:   CKD (chronic kidney disease), stage III (HCC)   Essential hypertension   Dehydration   Hypokalemia   GERD (gastroesophageal reflux disease)   Atrial fibrillation, chronic (HCC)   Hyperlipidemia   Nausea   Hypothyroidism   Hyponatremia   Prolonged QT interval   Lactic acidosis   Elevated troponin I level  Postprandial abdominal pain - has been having intermittent severe pain after eating meals -GI feels she could have intestinal angina, increased imdur dose but pain persists -HIDA scan 3/17 results within normal limits. -Pain mostly postprandial -Uncertain etiology, but a.m. cortisol noted to be low and patient will require endocrinology follow-up with ambulatory referral.  -Continue Levsin and BuSpar per GI -Colonoscopy on 3/15 without any significant findings -Unfortunately postprandial pain persists, continue full liquid diet for now -Did not increase imdur further due to soft BPs.  -PT WOULD LIKE TO ADVANCE DIET TODAY.  Advancing to soft foods diet.    Normocytic anemia -Anemia panel without significant findings  GERD/nausea -IV Compazine as needed for nausea  Chronic hyponatremia-improved  Lactic  acidosis-resolved -Likely related to dehydration  Hypokalemia  - oral replacement ordered  Hypothyroidism -Continue Synthroid  Chronic atrial fibrillation - having sinus bradycardia -Continue Eliquis for full anticoagulation -Currently in NSR -Holding amiodarone and metoprolol due to severe bradycardia (HR down to 30s)  CKDIIIa-stable  Essential hypertension -Continue hydralazine and Imdur  Dyslipidemia -Continue Crestor  Status postAICD  Toe Pain - Follow up outpatient with her podiatrist Dr. Irving Shows -He is planning amputation of left first toe   Goals of care - unfortunately patient not recovering like we had hoped and seems to be declining as she is not eating well and still having abdominal pain, I am asking for goals of care discussions with palliative medicine team  DVT prophylaxis: Eliquis Code Status: DNR Family Communication: Discussed with granddaughter [name] by telephone 3/18, 3/19 Disposition Plan: Home with [place] Status is: Inpatient  Remains inpatient appropriate because:IV treatments appropriate due to intensity of illness or inability to take PO and Inpatient level of care appropriate due to severity of illness  Dispo: The patient is from: Home  Anticipated d/c is to: Home with [facility]  Patient currently is not medically stable to d/c.              Difficult to place patient No  Consultants:   GI  Procedures:   See below  Antimicrobials:   None  Subjective: Pt still having postprandial pain but willing to try advancing to soft foods today.      Objective: Vitals:   04/02/20 2112 04/03/20 0543 04/03/20 2054 04/04/20 0509  BP: (!) 104/50 (!) 122/57 (!) 124/53 (!) 151/70  Pulse: (!) 52 (!) 53 66 (!) 57  Resp: '20 18 18 20  '$ Temp: 97.7 F (  36.5 C) 98.1 F (36.7 C) 98.5 F (36.9 C) 98 F (36.7 C)  TempSrc: Oral Oral Oral Oral  SpO2: 98% 95% 96% 97%  Weight:      Height:        Intake/Output Summary  (Last 24 hours) at 04/04/2020 1046 Last data filed at 04/04/2020 0900 Gross per 24 hour  Intake 480 ml  Output --  Net 480 ml   Filed Weights   03/27/20 2228 03/28/20 0636  Weight: 61.2 kg 58.5 kg    Examination:  General exam: Appears calm and comfortable, hard of hearing Respiratory system: Clear to auscultation. Respiratory effort normal. Cardiovascular system: normal s1, s2 sounds,  Gastrointestinal system: Abd soft, nondistended, mild RUQ tenderness with deep palpation, neg Murphy sign, no masses. No HSM.  Central nervous system: no focal findings.   Extremities: no C/C/E.  Skin: No gross abnormalities seen.  Psychiatry: normal mood and behavior.   Data Reviewed: I have personally reviewed following labs and imaging studies  CBC: Recent Labs  Lab 03/29/20 0551 03/30/20 0502 03/31/20 0509 04/04/20 0422  WBC 5.2 4.3 5.2 5.6  NEUTROABS  --   --   --  2.9  HGB 10.2* 11.0* 11.3* 10.1*  HCT 33.0* 35.0* 35.7* 31.2*  MCV 97.1 96.7 97.5 94.5  PLT 289 277 266 0000000   Basic Metabolic Panel: Recent Labs  Lab 03/29/20 0551 03/30/20 0502 03/31/20 0509 04/04/20 0422  NA 137 137 136 132*  K 4.8 4.1 4.1 3.1*  CL 105 106 105 103  CO2 24 22 21* 23  GLUCOSE 91 78 77 111*  BUN '12 10 10 '$ 29*  CREATININE 1.08* 1.04* 1.07* 1.18*  CALCIUM 8.8* 8.7* 8.7* 9.0  MG 1.9 2.1 2.0 1.9  PHOS 3.6  --   --   --    GFR: Estimated Creatinine Clearance: 28.3 mL/min (A) (by C-G formula based on SCr of 1.18 mg/dL (H)). Liver Function Tests: Recent Labs  Lab 03/29/20 0551  AST 39  ALT 29  ALKPHOS 58  BILITOT 0.5  PROT 6.0*  ALBUMIN 2.8*   No results for input(s): LIPASE, AMYLASE in the last 168 hours. No results for input(s): AMMONIA in the last 168 hours. Coagulation Profile: Recent Labs  Lab 03/29/20 0551  INR 1.4*   Cardiac Enzymes: No results for input(s): CKTOTAL, CKMB, CKMBINDEX, TROPONINI in the last 168 hours. BNP (last 3 results) No results for input(s): PROBNP in the  last 8760 hours. HbA1C: No results for input(s): HGBA1C in the last 72 hours. CBG: Recent Labs  Lab 03/28/20 1103 03/28/20 1636 03/28/20 2018 04/02/20 1011  GLUCAP 118* 144* 98 132*   Lipid Profile: No results for input(s): CHOL, HDL, LDLCALC, TRIG, CHOLHDL, LDLDIRECT in the last 72 hours. Thyroid Function Tests: No results for input(s): TSH, T4TOTAL, FREET4, T3FREE, THYROIDAB in the last 72 hours. Anemia Panel: No results for input(s): VITAMINB12, FOLATE, FERRITIN, TIBC, IRON, RETICCTPCT in the last 72 hours. Sepsis Labs: No results for input(s): PROCALCITON, LATICACIDVEN in the last 168 hours.  Recent Results (from the past 240 hour(s))  Resp Panel by RT-PCR (Flu A&B, Covid) Nasopharyngeal Swab     Status: None   Collection Time: 03/28/20  4:16 AM   Specimen: Nasopharyngeal Swab; Nasopharyngeal(NP) swabs in vial transport medium  Result Value Ref Range Status   SARS Coronavirus 2 by RT PCR NEGATIVE NEGATIVE Final    Comment: (NOTE) SARS-CoV-2 target nucleic acids are NOT DETECTED.  The SARS-CoV-2 RNA is generally detectable in upper respiratory  specimens during the acute phase of infection. The lowest concentration of SARS-CoV-2 viral copies this assay can detect is 138 copies/mL. A negative result does not preclude SARS-Cov-2 infection and should not be used as the sole basis for treatment or other patient management decisions. A negative result may occur with  improper specimen collection/handling, submission of specimen other than nasopharyngeal swab, presence of viral mutation(s) within the areas targeted by this assay, and inadequate number of viral copies(<138 copies/mL). A negative result must be combined with clinical observations, patient history, and epidemiological information. The expected result is Negative.  Fact Sheet for Patients:  EntrepreneurPulse.com.au  Fact Sheet for Healthcare Providers:   IncredibleEmployment.be  This test is no t yet approved or cleared by the [place] FDA and  has been authorized for detection and/or diagnosis of SARS-CoV-2 by FDA under an Emergency Use Authorization (EUA). This EUA will remain  in effect (meaning this test can be used) for the duration of the COVID-19 declaration under Section 564(b)(1) of the Act, 21 U.S.C.section 360bbb-3(b)(1), unless the authorization is terminated  or revoked sooner.       Influenza A by PCR NEGATIVE NEGATIVE Final   Influenza B by PCR NEGATIVE NEGATIVE Final    Comment: (NOTE) The Xpert Xpress SARS-CoV-2/FLU/RSV plus assay is intended as an aid in the diagnosis of influenza from Nasopharyngeal swab specimens and should not be used as a sole basis for treatment. Nasal washings and aspirates are unacceptable for Xpert Xpress SARS-CoV-2/FLU/RSV testing.  Fact Sheet for Patients: EntrepreneurPulse.com.au  Fact Sheet for Healthcare Providers: IncredibleEmployment.be  This test is not yet approved or cleared by the [place] FDA and has been authorized for detection and/or diagnosis of SARS-CoV-2 by FDA under an Emergency Use Authorization (EUA). This EUA will remain in effect (meaning this test can be used) for the duration of the COVID-19 declaration under Section 564(b)(1) of the Act, 21 U.S.C. section 360bbb-3(b)(1), unless the authorization is terminated or revoked.  Performed at [facility], [address], [name], [place] [address]     Radiology Studies: No results found. Scheduled Meds: . apixaban  2.5 mg Oral BID  . cholecalciferol  2,000 Units Oral Daily  . feeding supplement  237 mL Oral TID BM  . isosorbide mononitrate  30 mg Oral Daily  . levothyroxine  100 mcg Oral Daily  . multivitamin with minerals  1 tablet Oral Daily  . pantoprazole  40 mg Oral BID AC  . rosuvastatin  10 mg Oral Daily  . tamsulosin  0.4 mg Oral QPC  supper    LOS: 7 days   Time spent: 35 minutes  Serjio Deupree Wynetta Emery, MD How to contact the [facility] Attending or Consulting provider [place] or covering provider during after hours [place], for this patient?  1. Check the care team in [facility] and look for a) attending/consulting TRH provider listed and b) the [facility] team listed 2. Log into [email or web address] and use 's universal password to access. If you do not have the password, please contact the hospital operator. 3. Locate the [facility] provider you are looking for under [facility] and page to a number that you can be directly reached. 4. If you still have difficulty reaching the provider, please page the [facility] (Director on Call) for the Hospitalists listed on amion for assistance.   If 7PM-7AM, please contact night-coverage [email or web address] 04/04/2020, 10:46 AM

## 2020-04-04 NOTE — TOC Progression Note (Addendum)
Transition of Care [facility]) - Progression Note    Patient Details  Name: [name] MRN: [id number] Date of Birth: [birth year]  Transition of Care [facility]) CM/SW [place], LCSW Phone Number: 04/04/2020, 1:00 PM  Clinical Narrative:    CSW notified by RN that patient's family would like to speak with CSW about d/c planning, CSW contacted son and  Daughter. CSW received no answer and LVM. TOC to follow.  Addendum CSW spoke with patient's son. Patient's son reported that they are now agreeable to SNF referrals sent to [facility], [facility], and [facility]. CSW faxed referrals. TOC to follow.     Expected Discharge Plan: [place] Barriers to Discharge: No Barriers Identified  Expected Discharge Plan and Services Expected Discharge Plan: [place] Choice: NA Living arrangements for the past 2 months: Single Family Home                 DME Arranged: N/A DME Agency: NA       HH Arranged: RN,PT,OT,Nurse's Aide,Social Work [employer] Agency: Microbiologist ([place]) Date [place]: 04/02/20 Time [place]: 1114 Representative spoke with at [employer]: [place] ([place]) Interventions    Readmission Risk Interventions Readmission Risk Prevention Plan 03/28/2020 11/17/2019  Transportation Screening Complete Complete  Medication Review Press photographer) Complete Complete  PCP or Specialist appointment within 3-5 days of discharge - Complete  HRI or [place] Complete Complete  SW Recovery Care/Counseling Consult Complete Complete  Palliative Care Screening Not Applicable Not [place] Not Applicable Not Applicable  Some recent data might be hidden

## 2020-04-05 ENCOUNTER — Telehealth: Payer: Self-pay | Admitting: Gastroenterology

## 2020-04-05 ENCOUNTER — Encounter: Payer: Self-pay | Admitting: Internal Medicine

## 2020-04-05 DIAGNOSIS — R109 Unspecified abdominal pain: Secondary | ICD-10-CM | POA: Diagnosis not present

## 2020-04-05 DIAGNOSIS — E860 Dehydration: Secondary | ICD-10-CM | POA: Diagnosis not present

## 2020-04-05 DIAGNOSIS — N1831 Chronic kidney disease, stage 3a: Secondary | ICD-10-CM | POA: Diagnosis not present

## 2020-04-05 DIAGNOSIS — I482 Chronic atrial fibrillation, unspecified: Secondary | ICD-10-CM | POA: Diagnosis not present

## 2020-04-05 LAB — BASIC METABOLIC PANEL
Anion gap: 8 (ref 5–15)
BUN: 25 mg/dL — ABNORMAL HIGH (ref 8–23)
CO2: 23 mmol/L (ref 22–32)
Calcium: 9.6 mg/dL (ref 8.9–10.3)
Chloride: 106 mmol/L (ref 98–111)
Creatinine, Ser: 1.15 mg/dL — ABNORMAL HIGH (ref 0.44–1.00)
GFR, Estimated: 46 mL/min — ABNORMAL LOW (ref >60)
Glucose, Bld: 129 mg/dL — ABNORMAL HIGH (ref 70–99)
Potassium: 3.9 mmol/L (ref 3.5–5.1)
Sodium: 137 mmol/L (ref 135–145)

## 2020-04-05 LAB — RESP PANEL BY RT-PCR (FLU A&B, COVID) ARPGX2
Influenza A by PCR: NEGATIVE
Influenza B by PCR: NEGATIVE
SARS Coronavirus 2 by RT PCR: NEGATIVE

## 2020-04-05 LAB — SARS CORONAVIRUS 2 (TAT 6-24 HRS): SARS Coronavirus 2: NEGATIVE

## 2020-04-05 MED ORDER — HYDROCODONE-ACETAMINOPHEN 5-325 MG PO TABS
1.0000 | ORAL_TABLET | Freq: Four times a day (QID) | ORAL | 0 refills | Status: DC | PRN
Start: 2020-04-05 — End: 2020-05-11

## 2020-04-05 MED ORDER — ISOSORBIDE MONONITRATE ER 30 MG PO TB24: 30.0000 mg | ORAL_TABLET | Freq: Every day | ORAL | 2 refills | Status: DC

## 2020-04-05 MED ORDER — LORATADINE 10 MG PO TABS: 10.0000 mg | ORAL_TABLET | Freq: Every day | ORAL | 11 refills | Status: DC | PRN

## 2020-04-05 MED ORDER — HYDROCODONE-ACETAMINOPHEN 5-325 MG PO TABS
1.0000 | ORAL_TABLET | Freq: Four times a day (QID) | ORAL | Status: DC | PRN
Start: 2020-04-05 — End: 2020-04-06
  Administered 2020-04-05 – 2020-04-06 (×2): 1 via ORAL
  Filled 2020-04-05 (×2): qty 1

## 2020-04-05 MED ORDER — METOPROLOL TARTRATE 25 MG PO TABS: 12.5000 mg | ORAL_TABLET | Freq: Two times a day (BID) | ORAL | Status: DC

## 2020-04-05 MED ORDER — ACETAMINOPHEN 500 MG PO TABS: 500.0000 mg | ORAL_TABLET | Freq: Four times a day (QID) | ORAL | 0 refills | Status: DC | PRN

## 2020-04-05 MED ORDER — PANTOPRAZOLE SODIUM 40 MG PO TBEC: 40.0000 mg | DELAYED_RELEASE_TABLET | Freq: Two times a day (BID) | ORAL | 0 refills | Status: DC

## 2020-04-05 NOTE — Progress Notes (Signed)
[facility]) Hospital Liaison note:  This is a pending outpatient-based Palliative care patient. Will continue to follow for disposition.   Please call for any outpatient palliative care questions or concerns.  Thank you, Lorelee Market, LPN [facility] Liaison [phone]

## 2020-04-05 NOTE — Care Management Important Message (Signed)
Important Message  Patient Details  Name: [name] MRN: [id number] Date of Birth: [birth year]   Medicare Important Message Given:  Yes     Tommy Medal 04/05/2020, 1:36 PM

## 2020-04-05 NOTE — Progress Notes (Signed)
    Subjective: Abdominal pain improved since admission, now more like a "pressure". Has early satiety with eating. States she ate "well" for breakfast. Ate grits, milk, juice. One looser stool this morning. Hallucinations over the weekend now resolved.   Objective: Vital signs in last 24 hours: Temp:  [98.2 F (36.8 C)-98.3 F (36.8 C)] 98.2 F (36.8 C) (03/21 0508) Pulse Rate:  [65-69] 66 (03/21 0508) Resp:  [17-18] 18 (03/21 0508) BP: (124-165)/(64-79) 165/79 (03/21 0508) SpO2:  [97 %-98 %] 98 % (03/21 0508) Last BM Date: 04/04/20 General:   Alert and oriented, pleasant Abdomen:  Bowel sounds present, soft, mild discomfort lower abdomen, non-distended. Sitting up in chair Msk:  Symmetrical without gross deformities. Normal posture. Extremities:  Without  edema. Neurologic:  Alert and  oriented x4 Psych:  Alert and cooperative. Normal mood and affect.  Intake/Output from previous day: 03/20 0701 - 03/21 0700 In: 660 [P.O.:660] Out: 400 [Urine:400] Intake/Output this shift: Total I/O In: 160 [P.O.:160] Out: -   Lab Results: Recent Labs    04/04/20 0422  WBC 5.6  HGB 10.1*  HCT 31.2*  PLT 243   BMET Recent Labs    04/04/20 0422 04/05/20 0833  NA 132* 137  K 3.1* 3.9  CL 103 106  CO2 23 23  GLUCOSE 111* 129*  BUN 29* 25*  CREATININE 1.18* 1.15*  CALCIUM 9.0 9.6    Assessment: 87 year old female with chronic history of abdominal pain, admitted with several week history of worsening postprandial abdominal pain and nausea. Evaluation thus far extensive and included EGD Nov 2021, CTA Nov 2021, US abdomen without gallstones, HIDA scan unrevealing, colonoscopy this admission with 6 mm carpet-like polyp not removed as was on Eliquis, repeat CTA without evidence for large vessel disease, unable to tolerate Buspar due to hallucinations, not a candidate for TCAs in the past with prolonged QTC, and has noted most improvement with Imdur 30 mg daily.  Suspected  microvascular angina vs bowel hypersensitivity. Encouragingly, she has noted improvement with Buspar and now tolerating a soft diet. Abdominal discomfort present but much improved from admission.   Low cortisol level this admission: recommend endocrinology follow-up as outpatient.  Pancreatic lesion: seen on CT renal stone study and unchanged from 2020. Surveillance in 2 years.     Plan: Continue Imdur 30 mg daily PPI BID Soft diet Hopeful discharge soon to rehab Will sign off and arrange outpatient follow-up   Annitta Needs, PhD, ANP-BC [facility]    LOS: 8 days    04/05/2020, 9:44 AM

## 2020-04-05 NOTE — Discharge Summary (Addendum)
Physician Discharge Summary  [name] [id number] DOB: 1931/08/01 DOA: 03/27/2020  PCP: [name], DO GI: [facility] Dr. Abbey Chatters  Admit date: 03/27/2020 Discharge date: 04/05/2020  Admitted From:  Home  Disposition:  SNF   Recommendations for Outpatient Follow-up:  1. Follow up with [facility] in 4 weeks 2. Follow up with podiatrist Dr. Irving Shows in 1-2 weeks for toe evaluation/treatment 3. Establish care with endocrinologist in 2 weeks to be evaluated for adrenal insufficiency 4. Follow up with cardiologist in 2-3 weeks as scheduled 5. Please obtain BMP in 1 week to follow electrolytes 6. Outpatient referral to palliative care recommended 7. Consult to dietitian recommended  Discharge Condition: STABLE   CODE STATUS: DNR  DIET: soft foods diet    Brief Hospitalization Summary: Please see all hospital notes, images, labs for full details of the hospitalization. ADMISSION HPI: [name] is a 87 y.o. female with medical history significant for atrial fibrillation on Eliquis, CAD, HTN, HLD,AICD, CKD stage III, IBS, nephrolithiasis, arthritis, history of COVID-19 in 12/2018, and GERD with esophagitis who presented with complaint of several weeks of ongoing upper abdominal pain.  Pain was in epigastric and right upper quadrant area, it was constant and wax and wane, pain worsens with food, no alleviating factor was known.  Patient was seen in the ED 2 days ago and work-up at that time was reassuring, it was thought that she must have passed a kidney stone (patient does not think so).  She endorsed having nausea without vomiting, she denies fever, chills, headache, chest pain or shortness of breath.  ED Course:  In the emergency department, was elevated at 178/83, she was initially hypoxic, but O2 eventually increased to 93-100% on room air.  Work-up in the ED showed normocytic anemia, lactic acid 2.7 > 2.5.,  Hyponatremia, hypokalemia, BUN/creatinine 18/1.17  (creatinine within baseline range).  Albumin 3.2, troponin I -12 > 23.  SARS coronavirus 2 was negative.  CT angiography of abdomen and pelvis without and with contrast showed no evidence of acute mesenteric ischemia, unchanged appearance of focal dissection of the infrarenal aorta, unchanged severe stenosis of the left internal iliac artery and unchanged stenosis of the proximal left renal artery.  She was treated with IV fentanyl, IV Dilaudid and Protonix.  IV hydration was provided.  Hospitalist was asked to admit patient for further evaluation and management.  Lyons a 87 y.o.femalewith medical history significant foratrial fibrillation on Eliquis, CAD, HTN, HLD,AICD, CKD stage III, IBS, nephrolithiasis, arthritis, history of COVID-19 in 12/2018, and GERD with esophagitis who presented with complaintof several weeks of ongoing upper abdominal pain.Patient has been admitted for abdominal pain evaluation, but no significant findings noted on lab work or imaging is apparent.  GI has performed colonoscopy on 3/15 with noted diverticulosis in the sigmoid colon and polyp that was not removed.  Hospital Course / Assessment & Plan:    Postprandial abdominal pain - has been having intermittent abdominal pain after eating meals -GI feels she could have intestinal angina, increased imdur dose to 30 mg daily - Pt able to tolerate soft foods diet -HIDA scan 3/17 results within normal limits. -Pain mostly postprandial but has markedly improved.  -Uncertain etiology, but a.m. cortisol noted to be low and patient will require endocrinology follow-up with ambulatory referral.  -Colonoscopy on 3/15 without any significant findings -Did not increase imdur further due to soft BPs.  -Tolerating soft foods diet  Low AM Cortisol - ambulatory  referral to endocrinology for further work up and rule out of adrenal insufficiency  Normocytic anemia -Anemia panel without  significant findings  GERD/nausea -protonix ordered  Chronic hyponatremia-resolved  Lactic acidosis-resolved -Likely related to dehydration  Hypokalemia  - REPLETED - recheck BMP in 1 week recommended  Hypothyroidism -Continue Synthroid  Chronic atrial fibrillation - having sinus bradycardia -Continue Eliquis for full anticoagulation -Currently in NSR -DC amiodarone due to severe bradycardia (HR down to 30s) -reduced metoprolol to 12.5 mg BID with holding parameters (HOLD FOR SBP<95 or HR<65)  CKDIIIa-stable  Essential hypertension -Continue Imdur  Dyslipidemia -Continue Crestor  Status postAICD  Toe Pain - Follow up outpatient with her podiatrist Dr. Irving Shows -He is planning amputation of left first toe nail   Goals of care - Ambulatory referral to palliative care made   DVT prophylaxis:Eliquis Code Status:DNR Family Communication:Discussed with granddaughter [name] by telephone 3/18, 3/19 Disposition Plan:SNF Status is: Inpatient  Discharge Diagnoses:  Principal Problem:   Intractable abdominal pain Active Problems:   CKD (chronic kidney disease), stage III (HCC)   Essential hypertension   Dehydration   Hypokalemia   GERD (gastroesophageal reflux disease)   Atrial fibrillation, chronic (HCC)   Hyperlipidemia   Nausea   Hypothyroidism   Hyponatremia   Prolonged QT interval   Lactic acidosis   Elevated troponin I level   Discharge Instructions: Discharge Instructions    Amb Referral to Palliative Care   Complete by: As directed    Ambulatory referral to Endocrinology   Complete by: As directed      Allergies as of 04/05/2020      Reactions   Sotalol Other (See Comments)   Torsades   Tramadol Other (See Comments)   Hallucination, bad-dream   Actos [pioglitazone] Swelling   Adhesive [tape] Other (See Comments)   Causes sores   Atorvastatin Other (See Comments)   myalgia   Bactrim [sulfamethoxazole-trimethoprim]  Itching, Rash   Itchy rash   Ciprofloxacin Nausea Only   Contrast Media [iodinated Diagnostic Agents] Other (See Comments)   Headache (no action/pre-med required)   Latex Rash   Pedi-pre Tape Spray [wound Dressing Adhesive] Other (See Comments)   Causes sores   Sulfa Antibiotics Other (See Comments)   Causes sores      Medication List    STOP taking these medications   amiodarone 200 MG tablet Commonly known as: PACERONE   diphenoxylate-atropine 2.5-0.025 MG tablet Commonly known as: LOMOTIL   hydrALAZINE 25 MG tablet Commonly known as: APRESOLINE   ondansetron 4 MG tablet Commonly known as: ZOFRAN     TAKE these medications   acetaminophen 500 MG tablet Commonly known as: TYLENOL Take 1 tablet (500 mg total) by mouth every 6 (six) hours as needed for mild pain, fever or headache. What changed: reasons to take this   apixaban 2.5 MG Tabs tablet Commonly known as: ELIQUIS Take 1 tablet (2.5 mg total) by mouth 2 (two) times daily.   carboxymethylcellulose 0.5 % Soln Commonly known as: REFRESH PLUS Place 1 drop into both eyes 3 (three) times daily as needed (dry/irritated eyes.).   CENTRUM SILVER ULTRA WOMENS PO Take 1 tablet by mouth daily.   feeding supplement Liqd Take 237 mLs by mouth 2 (two) times daily between meals.   HYDROcodone-acetaminophen 5-325 MG tablet Commonly known as: Norco Take 1 tablet by mouth every 6 (six) hours as needed for severe pain. What changed:   when to take this  reasons to take this   isosorbide mononitrate 30  MG 24 hr tablet Commonly known as: IMDUR Take 1 tablet (30 mg total) by mouth daily. What changed: See the new instructions.   levothyroxine 100 MCG tablet Commonly known as: SYNTHROID Take 1 tablet (100 mcg total) by mouth daily.   loratadine 10 MG tablet Commonly known as: CLARITIN Take 1 tablet (10 mg total) by mouth daily as needed for allergies, rhinitis or itching. (for itching) What changed:   when to  take this  reasons to take this   meclizine 25 MG tablet Commonly known as: ANTIVERT Take 25 mg by mouth 3 (three) times daily as needed for dizziness or nausea.   metoprolol tartrate 25 MG tablet Commonly known as: LOPRESSOR Take 0.5 tablets (12.5 mg total) by mouth 2 (two) times daily. HOLD FOR SBP<95 OR HR<65 What changed:   how much to take  additional instructions   nitroGLYCERIN 0.4 MG SL tablet Commonly known as: NITROSTAT Place 1 tablet (0.4 mg total) under the tongue every 5 (five) minutes as needed for chest pain.   pantoprazole 40 MG tablet Commonly known as: PROTONIX Take 1 tablet (40 mg total) by mouth 2 (two) times daily before a meal. What changed: when to take this   PARoxetine 20 MG tablet Commonly known as: PAXIL TAKE (1) TABLET DAILY IN THE MORNING. What changed: See the new instructions.   rosuvastatin 10 MG tablet Commonly known as: CRESTOR Take 1 tablet (10 mg total) by mouth daily.   tamsulosin 0.4 MG Caps capsule Commonly known as: FLOMAX Take 1 capsule (0.4 mg total) by mouth daily.   Vitamin D 50 MCG (2000 UT) Caps Take 2,000 Units by mouth daily.       Contact information for follow-up providers    Ronnie Doss M, DO. Go on 04/02/2020.   Specialty: Family Medicine Why: 1:15 appointment.  Contact information: [place] [address] [phone]        Sanda Klein, MD .   Specialty: Cardiology Contact information: [address] [place] [address] [phone]        [facility].   Specialty: Home Health Services Why: Home health services will be provided via [facility]        [place]. Schedule an appointment as soon as possible for a visit in 4 week(s).   Why: Hospital Follow Up  Contact information: [address] [place] [phone]           Contact information for  after-discharge care    [facility] .   Service: Skilled Nursing Contact information: [address] [address] [phone]                 Allergies  Allergen Reactions  . Sotalol Other (See Comments)    Torsades   . Tramadol Other (See Comments)    Hallucination, bad-dream  . Actos [Pioglitazone] Swelling  . Adhesive [Tape] Other (See Comments)    Causes sores  . Atorvastatin Other (See Comments)    myalgia  . Bactrim [Sulfamethoxazole-Trimethoprim] Itching and Rash    Itchy rash  . Ciprofloxacin Nausea Only  . Contrast Media [Iodinated Diagnostic Agents] Other (See Comments)    Headache (no action/pre-med required)  . Latex Rash  . Pedi-Pre Tape Spray [Wound Dressing Adhesive] Other (See Comments)    Causes sores  . Sulfa Antibiotics Other (See Comments)    Causes  sores   Allergies as of 04/05/2020      Reactions   Sotalol Other (See Comments)   Torsades   Tramadol Other (See Comments)   Hallucination, bad-dream   Actos [pioglitazone] Swelling   Adhesive [tape] Other (See Comments)   Causes sores   Atorvastatin Other (See Comments)   myalgia   Bactrim [sulfamethoxazole-trimethoprim] Itching, Rash   Itchy rash   Ciprofloxacin Nausea Only   Contrast Media [iodinated Diagnostic Agents] Other (See Comments)   Headache (no action/pre-med required)   Latex Rash   Pedi-pre Tape Spray [wound Dressing Adhesive] Other (See Comments)   Causes sores   Sulfa Antibiotics Other (See Comments)   Causes sores      Medication List    STOP taking these medications   amiodarone 200 MG tablet Commonly known as: PACERONE   diphenoxylate-atropine 2.5-0.025 MG tablet Commonly known as: LOMOTIL   hydrALAZINE 25 MG tablet Commonly known as: APRESOLINE   ondansetron 4 MG tablet Commonly known as: ZOFRAN     TAKE these medications   acetaminophen 500 MG tablet Commonly known as: TYLENOL Take 1 tablet  (500 mg total) by mouth every 6 (six) hours as needed for mild pain, fever or headache. What changed: reasons to take this   apixaban 2.5 MG Tabs tablet Commonly known as: ELIQUIS Take 1 tablet (2.5 mg total) by mouth 2 (two) times daily.   carboxymethylcellulose 0.5 % Soln Commonly known as: REFRESH PLUS Place 1 drop into both eyes 3 (three) times daily as needed (dry/irritated eyes.).   CENTRUM SILVER ULTRA WOMENS PO Take 1 tablet by mouth daily.   feeding supplement Liqd Take 237 mLs by mouth 2 (two) times daily between meals.   HYDROcodone-acetaminophen 5-325 MG tablet Commonly known as: Norco Take 1 tablet by mouth every 6 (six) hours as needed for severe pain. What changed:   when to take this  reasons to take this   isosorbide mononitrate 30 MG 24 hr tablet Commonly known as: IMDUR Take 1 tablet (30 mg total) by mouth daily. What changed: See the new instructions.   levothyroxine 100 MCG tablet Commonly known as: SYNTHROID Take 1 tablet (100 mcg total) by mouth daily.   loratadine 10 MG tablet Commonly known as: CLARITIN Take 1 tablet (10 mg total) by mouth daily as needed for allergies, rhinitis or itching. (for itching) What changed:   when to take this  reasons to take this   meclizine 25 MG tablet Commonly known as: ANTIVERT Take 25 mg by mouth 3 (three) times daily as needed for dizziness or nausea.   metoprolol tartrate 25 MG tablet Commonly known as: LOPRESSOR Take 0.5 tablets (12.5 mg total) by mouth 2 (two) times daily. HOLD FOR SBP<95 OR HR<65 What changed:   how much to take  additional instructions   nitroGLYCERIN 0.4 MG SL tablet Commonly known as: NITROSTAT Place 1 tablet (0.4 mg total) under the tongue every 5 (five) minutes as needed for chest pain.   pantoprazole 40 MG tablet Commonly known as: PROTONIX Take 1 tablet (40 mg total) by mouth 2 (two) times daily before a meal. What changed: when to take this   PARoxetine 20 MG  tablet Commonly known as: PAXIL TAKE (1) TABLET DAILY IN THE MORNING. What changed: See the new instructions.   rosuvastatin 10 MG tablet Commonly known as: CRESTOR Take 1 tablet (10 mg total) by mouth daily.   tamsulosin 0.4 MG Caps capsule Commonly known as: FLOMAX Take 1 capsule (0.4  mg total) by mouth daily.   Vitamin D 50 MCG (2000 UT) Caps Take 2,000 Units by mouth daily.       Procedures/Studies: Abdomen 1 view (KUB)  Result Date: 03/24/2020 CLINICAL DATA:  History of right-sided kidney stone. EXAM: ABDOMEN - 1 VIEW COMPARISON:  Radiographs 03/10/2020.  CT today and 02/27/2020. FINDINGS: Bilateral renal calculi are visible. No suspicious calcifications are seen along the course of the ureters as correlated with today CT. The bowel gas pattern is nonobstructive. AICD and epicardial pacing leads are present. There are degenerative changes throughout the spine associated with a convex left scoliosis. IMPRESSION: Bilateral renal calculi. No acute findings identified. Electronically Signed   By: Richardean Sale M.D.   On: 03/24/2020 16:51   Abdomen 1 view (KUB)  Result Date: 03/10/2020 CLINICAL DATA:  Nephrolithiasis EXAM: ABDOMEN - 1 VIEW COMPARISON:  02/27/2020, 08/04/2013 FINDINGS: Similar bilateral mid and lower pole subcentimeter nephrolithiasis. Renal shadows are obscured by stool and gas pattern. Comparing to the prior CT, the previous known right mid ureteral calculus measuring 7 mm at the L4-5 level may still be present without significant migration. Degenerative changes scoliosis of the spine.  Bones are osteopenic. IMPRESSION: Suspect similarly positioned right mid ureteral calculus at the L4-5 level. Additional bilateral mid and lower pole nonobstructing nephrolithiasis. Electronically Signed   By: Jerilynn Mages.  Shick M.D.   On: 03/10/2020 11:28   NM Hepato W/EF  Result Date: 04/01/2020 CLINICAL DATA:  Upper abdominal pain EXAM: NUCLEAR MEDICINE HEPATOBILIARY IMAGING WITH GALLBLADDER  EF VIEWS: Anterior right upper quadrant RADIOPHARMACEUTICALS:  5.2 mCi Tc-81mmebrofenin IV COMPARISON:  None. FINDINGS: Liver uptake of radiotracer is unremarkable. There is prompt visualization of gallbladder and small bowel, indicating patency of the cystic and common bile ducts. A dose of 1.18 mcg of CCK was administered intravenously with calculation of the computer generated ejection fraction of radiotracer from the gallbladder. Patient did not experience clinical symptoms with the CCK administration. The computer generated ejection fraction of radiotracer from the gallbladder is within normal limits at 42%, normal greater than 40%. IMPRESSION: Study within normal limits. Electronically Signed   By: WLowella GripIII M.D.   On: 04/01/2020 11:55   CT RENAL STONE STUDY  Result Date: 03/24/2020 CLINICAL DATA:  Bilateral flank pain with nausea. Diagnosed with kidney stones 3 weeks ago. EXAM: CT ABDOMEN AND PELVIS WITHOUT CONTRAST TECHNIQUE: Multidetector CT imaging of the abdomen and pelvis was performed following the standard protocol without IV contrast. COMPARISON:  CT 02/27/2020. FINDINGS: Lower chest: Emphysematous changes and subpleural reticulation at both lung bases. A right lower lobe nodule measuring 6 mm on image 34/4 is similar to prior studies dating back to 05/11/2017 and earlier, consistent with a benign etiology. Cardiac AICD leads noted. Hepatobiliary: The liver appears somewhat dense without focal abnormality. No evidence of gallstones, gallbladder wall thickening or biliary dilatation. Pancreas: 7 mm low-density lesion in the pancreatic neck on image 26/2 is unchanged. This appears cystic on prior studies dating back to 01/08/2019 and is likely a benign finding. No pancreatic ductal dilatation or surrounding inflammation. Spleen: Normal in size without focal abnormality. Adrenals/Urinary Tract: Both adrenal glands appear normal. Extensive bilateral nephrocalcinosis with multiple caliceal  calculi bilaterally. The previously demonstrated right-sided hydronephrosis and mid right ureteral calculus are no longer present. Stable chronic left renal atrophy. No perinephric soft tissue stranding. The bladder appears unremarkable. Stomach/Bowel: The stomach appears unremarkable for its degree of distension. No evidence of bowel wall thickening, distention or surrounding inflammatory change.  The appendix appears normal. Vascular/Lymphatic: There are no enlarged abdominal or pelvic lymph nodes. Diffuse aortic and branch vessel atherosclerosis without evidence of aneurysm. Reproductive: Uterus and ovaries appear normal.  No adnexal mass. Other: No evidence of abdominal wall mass or hernia. No ascites. Multiple epicardial leads within the anterior abdominal wall. Musculoskeletal: No acute or significant osseous findings. Multilevel spondylosis associated with a convex left scoliosis. Stable chronic T12 and L1 superior endplate compression deformities. IMPRESSION: 1. Interval passage of previously demonstrated mid right ureteral calculus with resolved hydronephrosis. Extensive bilateral nephrocalcinosis with multiple caliceal calculi bilaterally. 2. No acute findings or explanation for the patient's symptoms. 3. Stable chronic left renal atrophy. 4. Aortic Atherosclerosis (ICD10-I70.0) and Emphysema (ICD10-J43.9). Electronically Signed   By: Richardean Sale M.D.   On: 03/24/2020 16:48   CT Angio Abd/Pel W and/or Wo Contrast  Result Date: 03/28/2020 CLINICAL DATA:  Chronic mesenteric ischemia. Right-sided abdominal pain. EXAM: CTA ABDOMEN AND PELVIS WITHOUT AND WITH CONTRAST TECHNIQUE: Multidetector CT imaging of the abdomen and pelvis was performed using the standard protocol during bolus administration of intravenous contrast. Multiplanar reconstructed images and MIPs were obtained and reviewed to evaluate the vascular anatomy. CONTRAST:  52m OMNIPAQUE IOHEXOL 350 MG/ML SOLN COMPARISON:  None. FINDINGS:  VASCULAR Aorta: Aortic course and caliber are normal. There is calcific aortic atherosclerosis. Focal dissection of the infrarenal aorta is unchanged (4:81). Celiac: Patent without evidence of aneurysm, dissection, vasculitis or significant stenosis. SMA: Patent without evidence of aneurysm, dissection, vasculitis or significant stenosis. Renals: Calcification causes unchanged stenosis of the proximal left renal artery. Right renal artery is normal. IMA: Patent with mild narrowing at the origin. Inflow: Severe stenosis of the left internal iliac artery due to mixed density atherosclerosis, unchanged. Otherwise unremarkable. Proximal Outflow: Bilateral common femoral and visualized portions of the superficial and profunda femoral arteries are patent without evidence of aneurysm, dissection, vasculitis or significant stenosis. Veins: The main portal vein, superior mesenteric vein and splenic vein are patent. The IVC is patent. Review of the MIP images confirms the above findings. NON-VASCULAR Lower chest: No acute abnormality. Hepatobiliary: No focal liver abnormality is seen. No gallstones, gallbladder wall thickening, or biliary dilatation. Pancreas: Unremarkable. No pancreatic ductal dilatation or surrounding inflammatory changes. Spleen: Normal in size without focal abnormality. Adrenals/Urinary Tract: Unchanged appearance of the kidneys with bilateral nephrocalcinosis. No hydronephrosis. Urinary bladder is distended. Stomach/Bowel: Small hiatal hernia. No bowel wall thickening. No pneumatosis. Lymphatic: No lymphadenopathy. Reproductive: Normal uterus.  No adnexal mass. Other: None Musculoskeletal: Lumbar levoscoliosis.  No acute abnormality. IMPRESSION: 1. No evidence of acute mesenteric ischemia. 2. Unchanged appearance of focal dissection of the infrarenal aorta. 3. Unchanged severe stenosis of the left internal iliac artery. 4. Unchanged stenosis of the proximal left renal artery. Aortic Atherosclerosis  (ICD10-I70.0). Electronically Signed   By: KUlyses JarredM.D.   On: 03/28/2020 01:53   [place]ABDOMEN LIMITED RUQ (LIVER/GB)  Result Date: 03/28/2020 CLINICAL DATA:  Intractable abdominal pain for several weeks. EXAM: ULTRASOUND ABDOMEN LIMITED RIGHT UPPER QUADRANT COMPARISON:  None. FINDINGS: Gallbladder: No gallstones or wall thickening visualized. No sonographic Murphy sign noted by sonographer. Common bile duct: Diameter: 3 mm Liver: Increased echogenicity with no focal mass. Portal vein is patent on color Doppler imaging with normal direction of blood flow towards the liver. Other: None. IMPRESSION: 1. No cause for pain identified. Electronically Signed   By: DDorise BullionIII M.D   On: 03/28/2020 10:34     Subjective: Pt tolerating soft diet pretty well.  She is agreeable to going to SNF rehab.    Discharge Exam: Vitals:   04/04/20 2050 04/05/20 0508  BP: 124/64 (!) 165/79  Pulse: 69 66  Resp: 18 18  Temp: 98.3 F (36.8 C) 98.2 F (36.8 C)  SpO2: 97% 98%   Vitals:   04/04/20 0509 04/04/20 1428 04/04/20 2050 04/05/20 0508  BP: (!) 151/70 135/64 124/64 (!) 165/79  Pulse: (!) 57 65 69 66  Resp: '20 17 18 18  '$ Temp: 98 F (36.7 C) 98.3 F (36.8 C) 98.3 F (36.8 C) 98.2 F (36.8 C)  TempSrc: Oral Oral    SpO2: 97% 97% 97% 98%  Weight:      Height:       General exam: Appears calm and comfortable, hard of hearing Respiratory system: Clear to auscultation. Respiratory effort normal. Cardiovascular system: normal s1, s2 sounds,  Gastrointestinal system: Abd soft, nondistended, nontender, neg Murphy sign, no masses. No HSM.  Central nervous system: no focal findings.   Extremities: no C/C/E.  Skin: No gross abnormalities seen.  Psychiatry: normal mood and behavior.    The results of significant diagnostics from this hospitalization (including imaging, microbiology, ancillary and laboratory) are listed below for reference.     Microbiology: Recent Results (from the past 240  hour(s))  Resp Panel by RT-PCR (Flu A&B, Covid) Nasopharyngeal Swab     Status: None   Collection Time: 03/28/20  4:16 AM   Specimen: Nasopharyngeal Swab; Nasopharyngeal(NP) swabs in vial transport medium  Result Value Ref Range Status   SARS Coronavirus 2 by RT PCR NEGATIVE NEGATIVE Final    Comment: (NOTE) SARS-CoV-2 target nucleic acids are NOT DETECTED.  The SARS-CoV-2 RNA is generally detectable in upper respiratory specimens during the acute phase of infection. The lowest concentration of SARS-CoV-2 viral copies this assay can detect is 138 copies/mL. A negative result does not preclude SARS-Cov-2 infection and should not be used as the sole basis for treatment or other patient management decisions. A negative result may occur with  improper specimen collection/handling, submission of specimen other than nasopharyngeal swab, presence of viral mutation(s) within the areas targeted by this assay, and inadequate number of viral copies(<138 copies/mL). A negative result must be combined with clinical observations, patient history, and epidemiological information. The expected result is Negative.  Fact Sheet for Patients:  EntrepreneurPulse.com.au  Fact Sheet for Healthcare Providers:  IncredibleEmployment.be  This test is no t yet approved or cleared by the [place] FDA and  has been authorized for detection and/or diagnosis of SARS-CoV-2 by FDA under an Emergency Use Authorization (EUA). This EUA will remain  in effect (meaning this test can be used) for the duration of the COVID-19 declaration under Section 564(b)(1) of the Act, 21 U.S.C.section 360bbb-3(b)(1), unless the authorization is terminated  or revoked sooner.       Influenza A by PCR NEGATIVE NEGATIVE Final   Influenza B by PCR NEGATIVE NEGATIVE Final    Comment: (NOTE) The Xpert Xpress SARS-CoV-2/FLU/RSV plus assay is intended as an aid in the diagnosis of influenza from  Nasopharyngeal swab specimens and should not be used as a sole basis for treatment. Nasal washings and aspirates are unacceptable for Xpert Xpress SARS-CoV-2/FLU/RSV testing.  Fact Sheet for Patients: EntrepreneurPulse.com.au  Fact Sheet for Healthcare Providers: IncredibleEmployment.be  This test is not yet approved or cleared by the [place] FDA and has been authorized for detection and/or diagnosis of SARS-CoV-2 by FDA under an Emergency Use Authorization (EUA). This EUA will remain  in effect (meaning this test can be used) for the duration of the COVID-19 declaration under Section 564(b)(1) of the Act, 21 U.S.C. section 360bbb-3(b)(1), unless the authorization is terminated or revoked.  Performed at [facility], [address], [place], [place] [address]      Labs: BNP (last 3 results) Recent Labs    11/14/19 2157  BNP [id number]*   Basic Metabolic Panel: Recent Labs  Lab 03/30/20 0502 03/31/20 0509 04/04/20 0422 04/05/20 0833  NA 137 136 132* 137  K 4.1 4.1 3.1* 3.9  CL 106 105 103 106  CO2 22 21* 23 23  GLUCOSE 78 77 111* 129*  BUN 10 10 29* 25*  CREATININE 1.04* 1.07* 1.18* 1.15*  CALCIUM 8.7* 8.7* 9.0 9.6  MG 2.1 2.0 1.9  --    Liver Function Tests: No results for input(s): AST, ALT, ALKPHOS, BILITOT, PROT, ALBUMIN in the last 168 hours. No results for input(s): LIPASE, AMYLASE in the last 168 hours. No results for input(s): AMMONIA in the last 168 hours. CBC: Recent Labs  Lab 03/30/20 0502 03/31/20 0509 04/04/20 0422  WBC 4.3 5.2 5.6  NEUTROABS  --   --  2.9  HGB 11.0* 11.3* 10.1*  HCT 35.0* 35.7* 31.2*  MCV 96.7 97.5 94.5  PLT 277 266 243   Cardiac Enzymes: No results for input(s): CKTOTAL, CKMB, CKMBINDEX, TROPONINI in the last 168 hours. BNP: Invalid input(s): POCBNP CBG: Recent Labs  Lab 04/02/20 1011  GLUCAP 132*   D-Dimer No results for input(s): DDIMER in the last 72 hours. Hgb A1c No  results for input(s): HGBA1C in the last 72 hours. Lipid Profile No results for input(s): CHOL, HDL, LDLCALC, TRIG, CHOLHDL, LDLDIRECT in the last 72 hours. Thyroid function studies No results for input(s): TSH, T4TOTAL, T3FREE, THYROIDAB in the last 72 hours.  Invalid input(s): FREET3 Anemia work up No results for input(s): VITAMINB12, FOLATE, FERRITIN, TIBC, IRON, RETICCTPCT in the last 72 hours. Urinalysis    Component Value Date/Time   COLORURINE AMBER (A) 03/03/2020 1815   APPEARANCEUR Clear 03/24/2020 1312   LABSPEC 1.014 03/03/2020 1815   PHURINE 6.0 03/03/2020 1815   GLUCOSEU Negative 03/24/2020 1312   HGBUR LARGE (A) 03/03/2020 1815   BILIRUBINUR Negative 03/24/2020 1312   KETONESUR NEGATIVE 03/03/2020 1815   PROTEINUR 1+ (A) 03/24/2020 1312   PROTEINUR 30 (A) 03/03/2020 1815   UROBILINOGEN negative 03/15/2015 1352   UROBILINOGEN 0.2 07/25/2011 2213   NITRITE Negative 03/24/2020 1312   NITRITE NEGATIVE 03/03/2020 1815   LEUKOCYTESUR Negative 03/24/2020 1312   LEUKOCYTESUR TRACE (A) 03/03/2020 1815   Sepsis Labs Invalid input(s): PROCALCITONIN,  WBC,  LACTICIDVEN Microbiology Recent Results (from the past 240 hour(s))  Resp Panel by RT-PCR (Flu A&B, Covid) Nasopharyngeal Swab     Status: None   Collection Time: 03/28/20  4:16 AM   Specimen: Nasopharyngeal Swab; Nasopharyngeal(NP) swabs in vial transport medium  Result Value Ref Range Status   SARS Coronavirus 2 by RT PCR NEGATIVE NEGATIVE Final    Comment: (NOTE) SARS-CoV-2 target nucleic acids are NOT DETECTED.  The SARS-CoV-2 RNA is generally detectable in upper respiratory specimens during the acute phase of infection. The lowest concentration of SARS-CoV-2 viral copies this assay can detect is 138 copies/mL. A negative result does not preclude SARS-Cov-2 infection and should not be used as the sole basis for treatment or other patient management decisions. A negative result may occur with  improper specimen  collection/handling, submission of specimen other than nasopharyngeal swab, presence  of viral mutation(s) within the areas targeted by this assay, and inadequate number of viral copies(<138 copies/mL). A negative result must be combined with clinical observations, patient history, and epidemiological information. The expected result is Negative.  Fact Sheet for Patients:  EntrepreneurPulse.com.au  Fact Sheet for Healthcare Providers:  IncredibleEmployment.be  This test is no t yet approved or cleared by the [place] FDA and  has been authorized for detection and/or diagnosis of SARS-CoV-2 by FDA under an Emergency Use Authorization (EUA). This EUA will remain  in effect (meaning this test can be used) for the duration of the COVID-19 declaration under Section 564(b)(1) of the Act, 21 U.S.C.section 360bbb-3(b)(1), unless the authorization is terminated  or revoked sooner.       Influenza A by PCR NEGATIVE NEGATIVE Final   Influenza B by PCR NEGATIVE NEGATIVE Final    Comment: (NOTE) The Xpert Xpress SARS-CoV-2/FLU/RSV plus assay is intended as an aid in the diagnosis of influenza from Nasopharyngeal swab specimens and should not be used as a sole basis for treatment. Nasal washings and aspirates are unacceptable for Xpert Xpress SARS-CoV-2/FLU/RSV testing.  Fact Sheet for Patients: EntrepreneurPulse.com.au  Fact Sheet for Healthcare Providers: IncredibleEmployment.be  This test is not yet approved or cleared by the [place] FDA and has been authorized for detection and/or diagnosis of SARS-CoV-2 by FDA under an Emergency Use Authorization (EUA). This EUA will remain in effect (meaning this test can be used) for the duration of the COVID-19 declaration under Section 564(b)(1) of the Act, 21 U.S.C. section 360bbb-3(b)(1), unless the authorization is terminated or revoked.  Performed at [facility], [address], [place], [place] [address]     Time coordinating discharge: 36 mins   SIGNED:  Irwin Brakeman, MD  [facility] 04/05/2020, 12:06 PM How to contact the [facility] Attending or Consulting provider [place] or covering provider during after hours [place], for this patient?  1. Check the care team in [facility] and look for a) attending/consulting TRH provider listed and b) the [facility] team listed 2. Log into [email or web address] and use [place]'s universal password to access. If you do not have the password, please contact the hospital operator. 3. Locate the [facility] provider you are looking for under [facility] and page to a number that you can be directly reached. 4. If you still have difficulty reaching the provider, please page the [facility] (Director on Call) for the Hospitalists listed on amion for assistance.

## 2020-04-05 NOTE — Plan of Care (Signed)

## 2020-04-05 NOTE — Telephone Encounter (Signed)
Erline Levine, please arrange 4-6 week hospital follow-up with Dr. Abbey Chatters or available APP (we have all seen her).

## 2020-04-06 ENCOUNTER — Telehealth: Payer: Self-pay

## 2020-04-06 DIAGNOSIS — M6281 Muscle weakness (generalized): Secondary | ICD-10-CM | POA: Diagnosis not present

## 2020-04-06 DIAGNOSIS — G8929 Other chronic pain: Secondary | ICD-10-CM | POA: Diagnosis not present

## 2020-04-06 DIAGNOSIS — K580 Irritable bowel syndrome with diarrhea: Secondary | ICD-10-CM | POA: Diagnosis not present

## 2020-04-06 DIAGNOSIS — I482 Chronic atrial fibrillation, unspecified: Secondary | ICD-10-CM | POA: Diagnosis not present

## 2020-04-06 DIAGNOSIS — M159 Polyosteoarthritis, unspecified: Secondary | ICD-10-CM | POA: Diagnosis not present

## 2020-04-06 DIAGNOSIS — K551 Chronic vascular disorders of intestine: Secondary | ICD-10-CM | POA: Diagnosis not present

## 2020-04-06 DIAGNOSIS — Z7901 Long term (current) use of anticoagulants: Secondary | ICD-10-CM | POA: Diagnosis not present

## 2020-04-06 DIAGNOSIS — R279 Unspecified lack of coordination: Secondary | ICD-10-CM | POA: Diagnosis not present

## 2020-04-06 DIAGNOSIS — I10 Essential (primary) hypertension: Secondary | ICD-10-CM | POA: Diagnosis not present

## 2020-04-06 DIAGNOSIS — N183 Chronic kidney disease, stage 3 unspecified: Secondary | ICD-10-CM | POA: Diagnosis not present

## 2020-04-06 DIAGNOSIS — R1314 Dysphagia, pharyngoesophageal phase: Secondary | ICD-10-CM | POA: Diagnosis not present

## 2020-04-06 DIAGNOSIS — E785 Hyperlipidemia, unspecified: Secondary | ICD-10-CM | POA: Diagnosis not present

## 2020-04-06 DIAGNOSIS — N1831 Chronic kidney disease, stage 3a: Secondary | ICD-10-CM | POA: Diagnosis not present

## 2020-04-06 DIAGNOSIS — K219 Gastro-esophageal reflux disease without esophagitis: Secondary | ICD-10-CM | POA: Diagnosis not present

## 2020-04-06 DIAGNOSIS — I4891 Unspecified atrial fibrillation: Secondary | ICD-10-CM | POA: Diagnosis not present

## 2020-04-06 DIAGNOSIS — Z7401 Bed confinement status: Secondary | ICD-10-CM | POA: Diagnosis not present

## 2020-04-06 DIAGNOSIS — E119 Type 2 diabetes mellitus without complications: Secondary | ICD-10-CM | POA: Diagnosis not present

## 2020-04-06 DIAGNOSIS — R109 Unspecified abdominal pain: Secondary | ICD-10-CM | POA: Diagnosis not present

## 2020-04-06 DIAGNOSIS — Z95810 Presence of automatic (implantable) cardiac defibrillator: Secondary | ICD-10-CM | POA: Diagnosis not present

## 2020-04-06 DIAGNOSIS — R9431 Abnormal electrocardiogram [ECG] [EKG]: Secondary | ICD-10-CM | POA: Diagnosis not present

## 2020-04-06 DIAGNOSIS — E871 Hypo-osmolality and hyponatremia: Secondary | ICD-10-CM | POA: Diagnosis not present

## 2020-04-06 DIAGNOSIS — E039 Hypothyroidism, unspecified: Secondary | ICD-10-CM | POA: Diagnosis not present

## 2020-04-06 DIAGNOSIS — R5381 Other malaise: Secondary | ICD-10-CM | POA: Diagnosis not present

## 2020-04-06 DIAGNOSIS — F32A Depression, unspecified: Secondary | ICD-10-CM | POA: Diagnosis not present

## 2020-04-06 DIAGNOSIS — I2510 Atherosclerotic heart disease of native coronary artery without angina pectoris: Secondary | ICD-10-CM | POA: Diagnosis not present

## 2020-04-06 DIAGNOSIS — R262 Difficulty in walking, not elsewhere classified: Secondary | ICD-10-CM | POA: Diagnosis not present

## 2020-04-06 DIAGNOSIS — G4700 Insomnia, unspecified: Secondary | ICD-10-CM | POA: Diagnosis not present

## 2020-04-06 DIAGNOSIS — E440 Moderate protein-calorie malnutrition: Secondary | ICD-10-CM | POA: Diagnosis not present

## 2020-04-06 DIAGNOSIS — D649 Anemia, unspecified: Secondary | ICD-10-CM | POA: Diagnosis not present

## 2020-04-06 NOTE — Progress Notes (Signed)
04/06/2020 10:13 AM  Pt awaiting for negative covid test prior to transfer to SNF. Pt remains stable to DC to SNF.  Please discharge summary from 04/05/20 No changes needed to care plan.    Murvin Natal MD  How to contact the [facility] Attending or Consulting provider [place] or covering provider during after hours [place], for this patient?  1. Check the care team in [facility] and look for a) attending/consulting TRH provider listed and b) the [facility] team listed 2. Log into [email or web address] and use [place]'s universal password to access. If you do not have the password, please contact the hospital operator. 3. Locate the [facility] provider you are looking for under [facility] and page to a number that you can be directly reached. 4. If you still have difficulty reaching the provider, please page the [facility] (Director on Call) for the Hospitalists listed on amion for assistance.

## 2020-04-06 NOTE — Telephone Encounter (Signed)
Dr. Synthia Innocent nurse states paper work is being worked on and should be completed today. [name] is finishing up the paper work.

## 2020-04-07 ENCOUNTER — Ambulatory Visit: Payer: PRIVATE HEALTH INSURANCE | Admitting: Urology

## 2020-04-08 ENCOUNTER — Telehealth: Payer: Self-pay

## 2020-04-08 DIAGNOSIS — K551 Chronic vascular disorders of intestine: Secondary | ICD-10-CM | POA: Diagnosis not present

## 2020-04-08 DIAGNOSIS — N1831 Chronic kidney disease, stage 3a: Secondary | ICD-10-CM | POA: Diagnosis not present

## 2020-04-08 DIAGNOSIS — D649 Anemia, unspecified: Secondary | ICD-10-CM | POA: Diagnosis not present

## 2020-04-08 DIAGNOSIS — I10 Essential (primary) hypertension: Secondary | ICD-10-CM | POA: Diagnosis not present

## 2020-04-08 DIAGNOSIS — E871 Hypo-osmolality and hyponatremia: Secondary | ICD-10-CM | POA: Diagnosis not present

## 2020-04-08 DIAGNOSIS — K219 Gastro-esophageal reflux disease without esophagitis: Secondary | ICD-10-CM | POA: Diagnosis not present

## 2020-04-08 DIAGNOSIS — I4891 Unspecified atrial fibrillation: Secondary | ICD-10-CM | POA: Diagnosis not present

## 2020-04-08 DIAGNOSIS — E039 Hypothyroidism, unspecified: Secondary | ICD-10-CM | POA: Diagnosis not present

## 2020-04-08 NOTE — Telephone Encounter (Signed)
Received a call from [facility] at the [facility] in [place]. She is requesting diet information for hx of stomach problems/diverticulitis. Hope Watts speech therapist would like to help the kitchen better help prepare foods for pt. Pt is currently on soft diet.  [phone]. Fax [phone]

## 2020-04-09 ENCOUNTER — Other Ambulatory Visit: Payer: Self-pay | Admitting: Urology

## 2020-04-13 ENCOUNTER — Other Ambulatory Visit: Payer: Self-pay | Admitting: *Deleted

## 2020-04-13 NOTE — Patient Outreach (Signed)
[place] Coordinator follow up. Member screened for potential [facility] care coordination needs.  [name] is receiving skilled therapy at [facility].   Update received from [facility] SW indicating member's transition plans are home with family vs LTC. States family is working with DSS for Medicaid.   Palliative follow up was suggested on hospital dc summary. SNF SW also reports family wants more information regarding palliative services before giving their consent.  SNF SW reports she has asked [facility] palliative to contact family to discuss further.  Will continue to follow for transition plans and potential [facility] care coordination needs while member resides in SNF.    Renee Rolling, MSN, RN,BSN [place] Acute Care Coordinator [phone] [facility]) [phone]  (Toll free office)

## 2020-04-13 NOTE — Patient Outreach (Signed)
Member screened for potential [place] coordination needs.  Per [place] (Patient [name]) member resides in [facility]. Communication sent to facility SW to inquire about transition plans.  Noted [name]'s PCP office has embedded [facility] care coordination team.   Will continue to follow while member resides to identify care coordination needs.   Marthenia Rolling, MSN, RN,BSN [place] Acute Care Coordinator [phone] [facility]) [phone]  (Toll free office)

## 2020-04-19 ENCOUNTER — Other Ambulatory Visit: Payer: Self-pay | Admitting: *Deleted

## 2020-04-19 ENCOUNTER — Telehealth: Payer: Self-pay | Admitting: *Deleted

## 2020-04-19 ENCOUNTER — Ambulatory Visit: Payer: Medicare Other | Admitting: *Deleted

## 2020-04-19 DIAGNOSIS — I10 Essential (primary) hypertension: Secondary | ICD-10-CM

## 2020-04-19 DIAGNOSIS — Z09 Encounter for follow-up examination after completed treatment for conditions other than malignant neoplasm: Secondary | ICD-10-CM

## 2020-04-19 NOTE — Patient Outreach (Signed)
[facility] Post- Acute Care Coordinator follow up. Member screened for potential [facility] care coordination needs.   Update received from [facility] SN[facility] SW reporting member will return home on tomorrow 04/20/20. Will have [facility]. SNF SW reports she has spoken to Mrs. Morillo's son [name] and daughter in law Hilda Blades more often. Ronnie son is Economist. SNF SW did not hear back from family regarding palliative care.  Mrs. Alcantar's PCP has [facility] embedded chronic care management team.  Will make referral due to member having frequent ED/ hospitalizations.   Attempted to reach [facility] (son/DPR) [phone]. No answer. Telephone call made son/daughter in law Ruqayya Thevenin [phone]. Patient identifiers confirmed. Hilda Blades confirms Mrs. Reczek returning home tomorrow. States Mrs. Hirons lives alone. Previous caregiver found another job. However, member's son is arranging for another caregiver to be with Mrs. Laban during the day. Reports member has 4 children who check on her regularly.   Hilda Blades states Mrs. Lyon will be at home alone at night however. States facility informed family that Mrs. Eib is going to the bathroom independently. States member will have bedside commode. Writer suggested family consider life alert system due to member being home alone at night.   Hilda Blades states she and [name] normally take Mrs. Ermalinda Barrios to her PCP appointments. Discussed follow up appointment will be needed post SNF. Hilda Blades also reports member's daughter Sonda Rumble [phone] can be contacted if [name] is unable to be reached.  Discussed that writer will make referral to [facility] embedded chronic care management/care coordination team at [place].   Update from SNF SW indicating member's PCP appointment is scheduled for 05/03/20.    Marthenia Rolling, MSN, RN,BSN [place] Acute Care Coordinator [phone] [facility]) [phone]  (Toll free office)

## 2020-04-19 NOTE — Chronic Care Management (AMB) (Signed)
  Chronic Care Management   Care Coordination Note  04/19/2020 Name: [name] MRN: [id number] DOB: [birth year]  Consulted by Marthenia Rolling, RN, [place] Acute Care Coordinator, today regarding patient's planned discharge from SNF tomorrow. See [facility] Coordination Telephone note from today. Patient's has supportive family and would like more information on Palliative Care. Referral was placed to Embedded CCM team for Care Management and Care Coordination.  Chart reviewed and upcoming appointment with PCP on 04/29/20 for discharge follow-up noted. Referral reviewed.   Collaborated with CCM Care Guide, Gladys Damme, regarding scheduling of this emergency referral. [place] that there is some availability for a new patient telephone visit tomorrow.   Follow up plan: Telephone follow up appointment with care management team member scheduled for: 04/20/20 at 3:00pm with RNCM  Chong Sicilian, BSN, RN-BC [place] / [place] Management Direct Dial: [phone]

## 2020-04-19 NOTE — Chronic Care Management (AMB) (Signed)
  Chronic Care Management   Note  04/19/2020 Name: [name] MRN: [id number] DOB: [birth year]  [name] is a 87 y.o. year old female who is a primary care patient of Renee Norlander, DO. I reached out to [name] son [name] by phone today in response to a referral sent by [name] PCP, Renee Norlander, DO     [name] was given information about Chronic Care Management services today including:  1. CCM service includes personalized support from designated clinical staff supervised by her physician, including individualized plan of care and coordination with other care providers 2. 24/7 contact phone numbers for assistance for urgent and routine care needs. 3. Service will only be billed when office clinical staff spend 20 minutes or more in a month to coordinate care. 4. Only one practitioner may furnish and bill the service in a calendar month. 5. The patient may stop CCM services at any time (effective at the end of the month) by phone call to the office staff. 6. The patient will be responsible for cost sharing (co-pay) of up to 20% of the service fee (after annual deductible is met).  Patient's son [name] agreed to services and verbal consent obtained.   Follow up plan: Telephone appointment with care management team member scheduled for:04/20/2020 [name] Care Guide, Embedded Care Coordination [place]  Care Management

## 2020-04-19 NOTE — Patient Instructions (Signed)
Initial CCM telephone visit scheduled for 04/20/20 at 3:00 with RN Care Manager  Chong Sicilian, BSN, RN-BC [place] / [place] Management Direct Dial: [phone]

## 2020-04-20 ENCOUNTER — Telehealth: Payer: Self-pay | Admitting: *Deleted

## 2020-04-20 ENCOUNTER — Telehealth: Payer: Medicare Other | Admitting: *Deleted

## 2020-04-20 NOTE — Telephone Encounter (Signed)
  Chronic Care Management   Outreach Note  04/20/2020 Name: [name] MRN: [id number] DOB: 1931-10-03  Referred by: [name], DO Reason for referral : Chronic Care Management (Unsuccessful Initial Telephone Visit)   A first unsuccessful Initial Telephone Visit was attempted today. The patient was referred to the case management team for assistance with care management and care coordination s/p SNF discharge. Patient is scheduled for a TOC visit with PCP on 05/03/20.    Interventions and Plan . Chart reviewed in preparation for telephone visit . Collaborated with other care team members . A HIPAA compliant phone message was left for the patient's son, [name], providing contact information and requesting a return call.  . Request sent to care guides to reach out and reschedule patient's telephone visit  Chong Sicilian, BSN, RN-BC [place] / [place] Management Direct Dial: [phone]

## 2020-04-21 ENCOUNTER — Ambulatory Visit (INDEPENDENT_AMBULATORY_CARE_PROVIDER_SITE_OTHER): Payer: Medicare Other

## 2020-04-21 ENCOUNTER — Telehealth: Payer: Self-pay

## 2020-04-21 DIAGNOSIS — I472 Ventricular tachycardia, unspecified: Secondary | ICD-10-CM

## 2020-04-21 NOTE — Telephone Encounter (Signed)
Attempted to contact patient's son [name] to schedule a Palliative Care consult appointment. No answer left a message to return call.  ?

## 2020-04-22 ENCOUNTER — Telehealth: Payer: Self-pay

## 2020-04-22 NOTE — Telephone Encounter (Signed)
Spoke with patient's son [name] and scheduled an in-person Palliative Consult for 05/11/20 @ 12:30PM  COVID screening was negative. No pets in home. Patient lives alone, but has caregivers throughout the day.   Consent obtained; updated Outlook/Netsmart/Team List and Epic.  Family is aware they may be receiving a call from NP the day before or day of to confirm appointment.

## 2020-04-23 ENCOUNTER — Telehealth: Payer: Self-pay

## 2020-04-23 ENCOUNTER — Telehealth: Payer: Self-pay | Admitting: Urology

## 2020-04-23 DIAGNOSIS — I4891 Unspecified atrial fibrillation: Secondary | ICD-10-CM | POA: Diagnosis not present

## 2020-04-23 DIAGNOSIS — Z8674 Personal history of sudden cardiac arrest: Secondary | ICD-10-CM | POA: Diagnosis not present

## 2020-04-23 DIAGNOSIS — K5730 Diverticulosis of large intestine without perforation or abscess without bleeding: Secondary | ICD-10-CM | POA: Diagnosis not present

## 2020-04-23 DIAGNOSIS — E119 Type 2 diabetes mellitus without complications: Secondary | ICD-10-CM | POA: Diagnosis not present

## 2020-04-23 DIAGNOSIS — I2510 Atherosclerotic heart disease of native coronary artery without angina pectoris: Secondary | ICD-10-CM | POA: Diagnosis not present

## 2020-04-23 DIAGNOSIS — E785 Hyperlipidemia, unspecified: Secondary | ICD-10-CM | POA: Diagnosis not present

## 2020-04-23 DIAGNOSIS — I129 Hypertensive chronic kidney disease with stage 1 through stage 4 chronic kidney disease, or unspecified chronic kidney disease: Secondary | ICD-10-CM | POA: Diagnosis not present

## 2020-04-23 DIAGNOSIS — N1831 Chronic kidney disease, stage 3a: Secondary | ICD-10-CM | POA: Diagnosis not present

## 2020-04-23 DIAGNOSIS — K551 Chronic vascular disorders of intestine: Secondary | ICD-10-CM | POA: Diagnosis not present

## 2020-04-23 DIAGNOSIS — M6281 Muscle weakness (generalized): Secondary | ICD-10-CM | POA: Diagnosis not present

## 2020-04-23 DIAGNOSIS — Z7984 Long term (current) use of oral hypoglycemic drugs: Secondary | ICD-10-CM | POA: Diagnosis not present

## 2020-04-23 DIAGNOSIS — G8929 Other chronic pain: Secondary | ICD-10-CM | POA: Diagnosis not present

## 2020-04-23 DIAGNOSIS — Z8616 Personal history of COVID-19: Secondary | ICD-10-CM | POA: Diagnosis not present

## 2020-04-23 DIAGNOSIS — R1314 Dysphagia, pharyngoesophageal phase: Secondary | ICD-10-CM | POA: Diagnosis not present

## 2020-04-23 DIAGNOSIS — Z95810 Presence of automatic (implantable) cardiac defibrillator: Secondary | ICD-10-CM | POA: Diagnosis not present

## 2020-04-23 LAB — CUP PACEART REMOTE DEVICE CHECK
Battery Remaining Longevity: 144 mo
Battery Remaining Percentage: 100 %
Brady Statistic RV Percent Paced: 6 %
Date Time Interrogation Session: 20220406130600
HighPow Impedance: 37 Ohm
Implantable Lead Implant Date: 20090120
Implantable Lead Location: 753860
Implantable Lead Model: 157
Implantable Lead Serial Number: 138237
Implantable Pulse Generator Implant Date: 20160126
Lead Channel Impedance Value: 539 Ohm
Lead Channel Pacing Threshold Amplitude: 0.8 V
Lead Channel Pacing Threshold Pulse Width: 0.5 ms
Lead Channel Setting Pacing Amplitude: 2.2 V
Lead Channel Setting Pacing Pulse Width: 0.5 ms
Lead Channel Setting Sensing Sensitivity: 0.6 mV
Pulse Gen Serial Number: 191956

## 2020-04-23 NOTE — Telephone Encounter (Signed)
Patient recently discharged from rehab and [name] with [facility] went out to evaluate patient today.  She has some questions about her medications.  Her discharge summary states she is taking Meclizine and Flomax.  Patient and daughter state she no longer takes either and that her urologist had discontinued the Flomax.  Also, patient states she is diabetic but she never checks blood sugars.  Do you recommend she start doing this?  Please advise.

## 2020-04-23 NOTE — Telephone Encounter (Signed)
Attempted to call [name]. Left message.

## 2020-04-23 NOTE — Telephone Encounter (Signed)
Please reschedule RN CM Initial call at [facility]

## 2020-04-23 NOTE — Telephone Encounter (Signed)
[name] called and wanted to see if the patient it still on tamsulosin. She was discharged and the papers stated she was still on it but her daughter is saying she isn't. Please call [name] [phone] you can. Thanks

## 2020-04-26 ENCOUNTER — Telehealth: Payer: Self-pay | Admitting: *Deleted

## 2020-04-26 NOTE — Telephone Encounter (Signed)
I called [name] back and she reports that family says pt is no longer taking the Tamsulosin.

## 2020-04-26 NOTE — Telephone Encounter (Signed)
[name] called and aware

## 2020-04-26 NOTE — Telephone Encounter (Signed)
Patient declines rescheduling follow up.   [name]

## 2020-04-26 NOTE — Telephone Encounter (Signed)
Meclizine is a PRN med that was historical (OTC), not prescribed.  Flomax was given as a 1 month supply in February by Dr Alyson Ingles.  Unsure if he intended her to be on this long term. You may want to reach out to his office to confirm.  Fasting morning BGs would be good to keep an eye on.

## 2020-04-26 NOTE — Progress Notes (Signed)
error 

## 2020-04-26 NOTE — Chronic Care Management (AMB) (Signed)
  Care Management   Note  04/26/2020 Name: [name] MRN: [id number] DOB: 1931-11-21  [name] is a 87 y.o. year old female who is a primary care patient of [name], DO and is actively engaged with the care management team. I reached out to Etheleen Nicks by phone today to assist with re-scheduling an initial visit with the RN Case Manager  Follow up plan: Patient declines further follow up and engagement by the care management team. Appropriate care team members and provider have been notified via electronic communication.   [place] Management

## 2020-04-28 ENCOUNTER — Telehealth: Payer: Self-pay

## 2020-04-28 DIAGNOSIS — N1831 Chronic kidney disease, stage 3a: Secondary | ICD-10-CM | POA: Diagnosis not present

## 2020-04-28 DIAGNOSIS — K5730 Diverticulosis of large intestine without perforation or abscess without bleeding: Secondary | ICD-10-CM | POA: Diagnosis not present

## 2020-04-28 DIAGNOSIS — K551 Chronic vascular disorders of intestine: Secondary | ICD-10-CM | POA: Diagnosis not present

## 2020-04-28 DIAGNOSIS — I4891 Unspecified atrial fibrillation: Secondary | ICD-10-CM | POA: Diagnosis not present

## 2020-04-28 DIAGNOSIS — G8929 Other chronic pain: Secondary | ICD-10-CM | POA: Diagnosis not present

## 2020-04-28 DIAGNOSIS — I129 Hypertensive chronic kidney disease with stage 1 through stage 4 chronic kidney disease, or unspecified chronic kidney disease: Secondary | ICD-10-CM | POA: Diagnosis not present

## 2020-04-28 NOTE — Telephone Encounter (Signed)
Laurey Arrow (PT from [facility]) called stating that he visited pt today at her residence and will continue PT 1x/week for 4 weeks.  Can contact Laurey Arrow if needed at [phone]

## 2020-05-03 ENCOUNTER — Other Ambulatory Visit: Payer: Self-pay

## 2020-05-03 ENCOUNTER — Ambulatory Visit (INDEPENDENT_AMBULATORY_CARE_PROVIDER_SITE_OTHER): Payer: Medicare Other | Admitting: Family Medicine

## 2020-05-03 ENCOUNTER — Encounter: Payer: Self-pay | Admitting: Family Medicine

## 2020-05-03 ENCOUNTER — Encounter: Payer: Medicare Other | Admitting: Cardiovascular Disease

## 2020-05-03 VITALS — BP 154/78 | HR 50 | Temp 97.9°F | Ht 63.0 in | Wt 122.4 lb

## 2020-05-03 DIAGNOSIS — G8929 Other chronic pain: Secondary | ICD-10-CM | POA: Diagnosis not present

## 2020-05-03 DIAGNOSIS — R7989 Other specified abnormal findings of blood chemistry: Secondary | ICD-10-CM

## 2020-05-03 DIAGNOSIS — M25561 Pain in right knee: Secondary | ICD-10-CM

## 2020-05-03 DIAGNOSIS — R109 Unspecified abdominal pain: Secondary | ICD-10-CM | POA: Diagnosis not present

## 2020-05-03 DIAGNOSIS — Z09 Encounter for follow-up examination after completed treatment for conditions other than malignant neoplasm: Secondary | ICD-10-CM

## 2020-05-03 DIAGNOSIS — E2740 Unspecified adrenocortical insufficiency: Secondary | ICD-10-CM | POA: Diagnosis not present

## 2020-05-03 MED ORDER — METHYLPREDNISOLONE ACETATE 40 MG/ML IJ SUSP
40.0000 mg | Freq: Once | INTRAMUSCULAR | Status: DC
Start: 2020-05-03 — End: 2020-05-03

## 2020-05-03 MED ORDER — METHYLPREDNISOLONE ACETATE 40 MG/ML IJ SUSP
40.0000 mg | Freq: Once | INTRAMUSCULAR | Status: AC
  Administered 2020-05-03: 40 mg via INTRA_ARTICULAR

## 2020-05-03 NOTE — Progress Notes (Signed)
Subjective: CC: Nursing Home discharge follow up PCP: [name], DO [id number] [name] is a 87 y.o. female, who is accompanied today's visit by her son-in-law.  She is presenting to clinic today for:  1. Abdominal pain Patient discharged from the [facility] recently.  She has home health physical therapy coming in 1 time per week for the next 4 weeks.  To summarize, she was hospitalized due to intractable abdominal pain.  She had a CT angiogram of the abdomen and pelvis which showed no evidence of mesenteric ischemia, dissection, progression of the iliac artery stenosis or progression of the proximal left renal artery stenosis.  She was treated with IV opioids and PPI.  She was hydrated through IV.  She had eval by GI 3/15 with normal HIDA scan on 3/17.  Her diet was advanced to soft foods and she was discharged on this to short-term nursing facility.  She was advised to follow-up with podiatry for evaluation of her toe as well as with endocrinology for adrenal insufficiency evaluation (cortisol running low in the hospital).  Follow-up with cardiology was scheduled for about 3 weeks following discharge.   She has since been referred to palliative care.  She has follow-up with her cardiologist in June.  She apparently had an appointment today but she canceled it and the soonest that she could get in was June.  She continues to have intermittent abdominal pain but has been trying to eat small soft food in efforts to avoid exacerbation of the pain.  If she starts feeling discomfort she stops eating.  She admits to significant weight loss over the last year but she seems to be maintaining since her discharge.  She was 119 lb and today she is 122.  She continues to have right-sided knee pain and asked for corticosteroid injection today.  Last injection was in January.  ROS: Per HPI  Allergies  Allergen Reactions  . Sotalol Other (See Comments)    Torsades   . Tramadol Other (See  Comments)    Hallucination, bad-dream  . Actos [Pioglitazone] Swelling  . Adhesive [Tape] Other (See Comments)    Causes sores  . Atorvastatin Other (See Comments)    myalgia  . Bactrim [Sulfamethoxazole-Trimethoprim] Itching and Rash    Itchy rash  . Ciprofloxacin Nausea Only  . Contrast Media [Iodinated Diagnostic Agents] Other (See Comments)    Headache (no action/pre-med required)  . Latex Rash  . Pedi-Pre Tape Spray [Wound Dressing Adhesive] Other (See Comments)    Causes sores  . Sulfa Antibiotics Other (See Comments)    Causes sores   Past Medical History:  Diagnosis Date  . AICD (automatic cardioverter/defibrillator) present   . Allergy    SESONAL  . Anxiety   . ARTHRITIS   . Arthritis   . Atrial fibrillation ([place])   . CAD (coronary artery disease)    a. history of cardiac arrest 1993;  b. s/p LAD/LCX stenting in 2003;  c. 07/2011 Cath: LM nl, LAD patent stent, D1 80ost, LCX patent stent, RCA min irregs;  d. 04/2012 MV: EF 66%, no ishcemia;  e. 12/2013 Echo: Ef 55%, no rwma, Gr 1 DD, triv AI/MR, mildly dil LA;  f. 12/2013 Lexi MV: intermediate risk - apical ischemia and inf/infsept fixed defect, ? artifactual.  . CAD in native artery 12/31/2013   Previous stents to LAD and Ramus patent on cath today 12/31/13 also There is severe disease in the ostial first diagonal which is unchanged from most  recent cardiac catheterization. The right coronary artery could not be engaged selectively but nonselective angiography showed no significant disease in the proximal and midsegment.     . Cataract    DENIES  . Chronic back pain   . Chronic sinus bradycardia   . CKD (chronic kidney disease), stage III ([place])   . Clotting disorder (HCC)    DVT  . COLITIS 12/02/2007   Qualifier: Diagnosis of  By: Nils Pyle CMA ([place]), [name]    . Diabetes mellitus without complication ([place])    DENIES  . Diverticulosis of colon (without mention of hemorrhage) 2007   Colonoscopy   . Esophageal  stricture    a. 2012 s/p dil.  . Esophagitis, unspecified    a. 2012 EGD  . EXTERNAL HEMORRHOIDS   . GERD (gastroesophageal reflux disease)    omeprazole  . H/O hiatal hernia   . Hiatal hernia    a. 2012 EGD.  Marland Kitchen History of DVT in the past, not on Coumadin now    left  leg  . HYPERCHOLESTEROLEMIA   . Hypertension   . ICD (implantable cardiac defibrillator) in place    a. s/p initial ICD in 1993 in setting of cardiac arrest;  b. 01/2007 gen change: Guidant T135 Vitality DS VR single lead ICD.  . Macular degeneration    gets injecton in eye every 5 weeks- last injection - 05/03/2013   . Myocardial infarction ([place]) 1993  . Near syncope 05/22/2015  . Nephrolithiasis, just saw Dr [name]   . Orthostatic hypotension   . Peripheral vascular disease ([place])    ???  . RECTAL BLEEDING 12/03/2007   Qualifier: Diagnosis of  By: Sharlett Iles MD Byrd Hesselbach   . Unspecified gastritis and gastroduodenitis without mention of hemorrhage    a. 2003 EGD->not noted on 2012 EGD.  Marland Kitchen Unstable angina, neg MI, cath stable.maybe GI 12/30/2013    Current Outpatient Medications:  .  acetaminophen (TYLENOL) 500 MG tablet, Take 1 tablet (500 mg total) by mouth every 6 (six) hours as needed for mild pain, fever or headache., Disp: 30 tablet, Rfl: 0 .  apixaban (ELIQUIS) 2.5 MG TABS tablet, Take 1 tablet (2.5 mg total) by mouth 2 (two) times daily., Disp: 60 tablet, Rfl: 5 .  carboxymethylcellulose (REFRESH PLUS) 0.5 % SOLN, Place 1 drop into both eyes 3 (three) times daily as needed (dry/irritated eyes.)., Disp: , Rfl:  .  Cholecalciferol (VITAMIN D) 50 MCG (2000 UT) CAPS, Take 2,000 Units by mouth daily. , Disp: , Rfl:  .  feeding supplement (ENSURE ENLIVE / ENSURE PLUS) LIQD, Take 237 mLs by mouth 2 (two) times daily between meals., Disp: , Rfl:  .  HYDROcodone-acetaminophen (NORCO) 5-325 MG tablet, Take 1 tablet by mouth every 6 (six) hours as needed for severe pain., Disp: 10 tablet, Rfl: 0 .  isosorbide  mononitrate (IMDUR) 30 MG 24 hr tablet, Take 1 tablet (30 mg total) by mouth daily., Disp: 90 tablet, Rfl: 2 .  levothyroxine (SYNTHROID) 100 MCG tablet, Take 1 tablet (100 mcg total) by mouth daily., Disp: 30 tablet, Rfl: 5 .  loratadine (CLARITIN) 10 MG tablet, Take 1 tablet (10 mg total) by mouth daily as needed for allergies, rhinitis or itching. (for itching), Disp: 30 tablet, Rfl: 11 .  meclizine (ANTIVERT) 25 MG tablet, Take 25 mg by mouth 3 (three) times daily as needed for dizziness or nausea., Disp: , Rfl:  .  metoprolol tartrate (LOPRESSOR) 25 MG tablet, Take 0.5 tablets (12.5 mg  total) by mouth 2 (two) times daily. HOLD FOR SBP<95 OR HR<65, Disp: , Rfl:  .  Multiple Vitamins-Minerals (CENTRUM SILVER ULTRA WOMENS PO), Take 1 tablet by mouth daily., Disp: , Rfl:  .  nitroGLYCERIN (NITROSTAT) 0.4 MG SL tablet, Place 1 tablet (0.4 mg total) under the tongue every 5 (five) minutes as needed for chest pain., Disp: 25 tablet, Rfl: 2 .  pantoprazole (PROTONIX) 40 MG tablet, Take 1 tablet (40 mg total) by mouth 2 (two) times daily before a meal., Disp: 90 tablet, Rfl: 0 .  PARoxetine (PAXIL) 20 MG tablet, TAKE (1) TABLET DAILY IN THE MORNING., Disp: 90 tablet, Rfl: 0 .  rosuvastatin (CRESTOR) 10 MG tablet, Take 1 tablet (10 mg total) by mouth daily., Disp: 90 tablet, Rfl: 1 .  tamsulosin (FLOMAX) 0.4 MG CAPS capsule, Take 1 capsule (0.4 mg total) by mouth daily., Disp: 30 capsule, Rfl: 0 Social History   Socioeconomic History  . Marital status: Widowed    Spouse name: Not on file  . Number of children: 4  . Years of education: 8  . Highest education level: 8th grade  Occupational History  . Occupation: Retired  Tobacco Use  . Smoking status: Never Smoker  . Smokeless tobacco: Never Used  Vaping Use  . Vaping Use: Never used  Substance and Sexual Activity  . Alcohol use: No  . Drug use: No  . Sexual activity: Never    Birth control/protection: Post-menopausal  Other Topics Concern   . Not on file  Social History Narrative  . Not on file   Social Determinants of Health   Financial Resource Strain: Not on file  Food Insecurity: Not on file  Transportation Needs: Not on file  Physical Activity: Not on file  Stress: Not on file  Social Connections: Not on file  Intimate Partner Violence: Not on file   Family History  Problem Relation Age of Onset  . Stroke Mother   . Other Mother        brain tumor  . Other Father        MI  . Heart attack Father   . Stroke Sister   . Macular degeneration Sister   . Diabetes Daughter   . Cancer Daughter        ovarian  . Colon polyps Daughter   . Atrial fibrillation Sister   . Hyperlipidemia Sister   . Osteoporosis Sister   . Stroke Sister   . Uterine cancer Sister   . Heart attack Brother   . Heart disease Brother   . Asthma Brother   . Breast cancer Neg Hx     Objective: Office vital signs reviewed. BP (!) 154/78   Pulse (!) 50   Temp 97.9 F (36.6 C)   Ht '5\' 3"'$  (1.6 m)   Wt 122 lb 6.4 oz (55.5 kg)   SpO2 98%   BMI 21.68 kg/m   Physical Examination:  General: Awake, alert, nontoxic elderly female, No acute distress HEENT: Normal; sclera white Cardio: bradycardic with regular rhythm, S1S2 heard, no murmurs appreciated Pulm: clear to auscultation bilaterally, no wheezes, rhonchi or rales; normal work of breathing on room air Extremities: warm, well perfused, No edema, cyanosis or clubbing; +2 pulses bilaterally MSK: Ambulating with use of rolling walker.  She has degenerative changes throughout the right knee.  No tenderness to palpation to the patella, patellar tendon or quad tendon.  JOINT INJECTION:  Patient denies allergy to antiseptics (including iodine) and anesthetics.  Patient with  h/o contorlled diabetes. No frequent steroid use (last inject 01/2020). She does use of blood thinners/ antiplatelets.  Patient was given informed consent and a signed copy has been placed in the chart. Appropriate  time out was taken. Area prepped and draped in usual sterile fashion. Anatomic landmarks were identified and injection site was marked.  Ethyl chloride spray was used to numb the area and 1 cc of methylprednisolone 40 mg/ml plus  3 cc of 1% lidocaine without epinephrine was injected into the right knee using a(n) anteriolateral approach. The patient tolerated the procedure well and there were no immediate complications. Estimated blood loss is less than 1 cc.  Post procedure instructions were reviewed and handout outlining these instructions were provided to patient.  Assessment/ Plan: 87 y.o. female   Hospital discharge follow-up  Chronic abdominal pain  Chronic pain of right knee - Plan: methylPREDNISolone acetate (DEPO-MEDROL) injection 40 mg, DISCONTINUED: methylPREDNISolone acetate (DEPO-MEDROL) injection 40 mg  Low serum cortisol level ([place]) - Plan: Ambulatory referral to Endocrinology  I reviewed her discharge summary and recommendations.  Unfortunately notes from her short-term nursing facility are not available for my review.  Her chronic abdominal pain is stable but somewhat improved compared to when she was hospitalized.  I will order referral to endocrinology for low a.m. cortisol level but blood pressures are normal here.  No evidence of adrenal insufficiency from a vital sign standpoint.  Recommend follow-up with cardiology and podiatry as prescribed.  No orders of the defined types were placed in this encounter.  No orders of the defined types were placed in this encounter.    [name], DO [place] [phone]

## 2020-05-03 NOTE — Patient Instructions (Signed)
Knee Injection A knee injection is a procedure to get medicine into your knee joint to relieve the pain, swelling, and stiffness of arthritis. Your health care provider uses a needle to inject medicine, which may also help to lubricate and cushion your knee joint. You may need more than one injection. Tell a health care provider about:  Any allergies you have.  All medicines you are taking, including vitamins, herbs, eye drops, creams, and over-the-counter medicines.  Any problems you or family members have had with anesthetic medicines.  Any blood disorders you have.  Any surgeries you have had.  Any medical conditions you have.  Whether you are pregnant or may be pregnant. What are the risks? Generally, this is a safe procedure. However, problems may occur, including:  Infection.  Bleeding.  Symptoms that get worse.  Damage to the area around your knee.  Allergic reaction to any of the medicines.  Skin reactions from repeated injections. What happens before the procedure?  Ask your health care provider about: ? Changing or stopping your regular medicines. This is especially important if you are taking diabetes medicines or blood thinners. ? Taking medicines such as aspirin and ibuprofen. These medicines can thin your blood. Do not take these medicines unless your health care provider tells you to take them. ? Taking over-the-counter medicines, vitamins, herbs, and supplements.  Plan to have a responsible adult take you home from the hospital or clinic. What happens during the procedure?  You will sit or lie down in a position for your knee to be treated.  The skin over your kneecap will be cleaned with a germ-killing soap.  You will be given a medicine that numbs the area (local anesthetic). You may feel some stinging.  The medicine will be injected into your knee. The needle is carefully placed between your kneecap and your knee. The medicine is injected into the  joint space.  The needle will be removed at the end of the procedure.  A bandage (dressing) may be placed over the injection site. The procedure may vary among health care providers and hospitals.   What can I expect after the procedure?  Your blood pressure, heart rate, breathing rate, and blood oxygen level will be monitored until you leave the hospital or clinic.  You may have to move your knee through its full range of motion. This helps to get all the medicine into your joint space.  You will be watched to make sure that you do not have a reaction to the injected medicine.  You may feel more pain, swelling, and warmth than you did before the injection. This reaction may last about 1-2 days. Follow these instructions at home: Medicines  Take over-the-counter and prescription medicines only as told by your health care provider.  Ask your health care provider if the medicine prescribed to you requires you to avoid driving or using machinery.  Do not take medicines such as aspirin and ibuprofen unless your health care provider tells you to take them. Injection site care  Follow instructions from your health care provider about: ? How to take care of your puncture site. ? When and how you should change your dressing. ? When you should remove your dressing.  Check your injection area every day for signs of infection. Check for: ? More redness, swelling, or pain after 2 days. ? Fluid or blood. ? Pus or a bad smell. ? Warmth. Managing pain, stiffness, and swelling  If directed, put ice on   the injection area. To do this: ? Put ice in a plastic bag. ? Place a towel between your skin and the bag. ? Leave the ice on for 20 minutes, 2-3 times per day. ? Remove the ice if your skin turns bright red. This is very important. If you cannot feel pain, heat, or cold, you have a greater risk of damage to the area.  Do not apply heat to your knee.  Raise (elevate) the injection area  above the level of your heart while you are sitting or lying down.   General instructions  If you were given a dressing, keep it dry until your health care provider says it can be removed. Ask your health care provider when you can start showering or bathing.  Avoid strenuous activities for as long as directed by your health care provider. Ask your health care provider when you can return to your normal activities.  Keep all follow-up visits. This is important. You may need more injections. Contact a health care provider if you have:  A fever.  Warmth in your injection area.  Fluid, blood, or pus coming from your injection site.  Symptoms at your injection site that last longer than 2 days after your procedure. Get help right away if:  Your knee turns very red.  Your knee becomes very swollen.  Your knee is in severe pain. Summary  A knee injection is a procedure to get medicine into your knee joint to relieve the pain, swelling, and stiffness of arthritis.  A needle is carefully placed between your kneecap and your knee to inject medicine into the joint space.  Before the procedure, ask your health care provider about changing or stopping your regular medicines, especially if you are taking diabetes medicines or blood thinners.  Contact your health care provider if you have any problems or questions after your procedure. This information is not intended to replace advice given to you by your health care provider. Make sure you discuss any questions you have with your health care provider. Document Revised: 06/18/2019 Document Reviewed: 06/18/2019 Elsevier Patient Education  2021 [employer]  

## 2020-05-04 ENCOUNTER — Ambulatory Visit (INDEPENDENT_AMBULATORY_CARE_PROVIDER_SITE_OTHER): Payer: Medicare Other

## 2020-05-04 DIAGNOSIS — I2510 Atherosclerotic heart disease of native coronary artery without angina pectoris: Secondary | ICD-10-CM | POA: Diagnosis not present

## 2020-05-04 DIAGNOSIS — N1831 Chronic kidney disease, stage 3a: Secondary | ICD-10-CM

## 2020-05-04 DIAGNOSIS — I4891 Unspecified atrial fibrillation: Secondary | ICD-10-CM | POA: Diagnosis not present

## 2020-05-04 DIAGNOSIS — I129 Hypertensive chronic kidney disease with stage 1 through stage 4 chronic kidney disease, or unspecified chronic kidney disease: Secondary | ICD-10-CM

## 2020-05-04 DIAGNOSIS — E785 Hyperlipidemia, unspecified: Secondary | ICD-10-CM | POA: Diagnosis not present

## 2020-05-04 DIAGNOSIS — G8929 Other chronic pain: Secondary | ICD-10-CM | POA: Diagnosis not present

## 2020-05-04 DIAGNOSIS — R1314 Dysphagia, pharyngoesophageal phase: Secondary | ICD-10-CM | POA: Diagnosis not present

## 2020-05-04 DIAGNOSIS — Z7984 Long term (current) use of oral hypoglycemic drugs: Secondary | ICD-10-CM

## 2020-05-04 DIAGNOSIS — K5730 Diverticulosis of large intestine without perforation or abscess without bleeding: Secondary | ICD-10-CM

## 2020-05-04 DIAGNOSIS — E119 Type 2 diabetes mellitus without complications: Secondary | ICD-10-CM | POA: Diagnosis not present

## 2020-05-04 DIAGNOSIS — K551 Chronic vascular disorders of intestine: Secondary | ICD-10-CM

## 2020-05-04 DIAGNOSIS — Z8674 Personal history of sudden cardiac arrest: Secondary | ICD-10-CM

## 2020-05-04 DIAGNOSIS — M6281 Muscle weakness (generalized): Secondary | ICD-10-CM | POA: Diagnosis not present

## 2020-05-04 DIAGNOSIS — Z95810 Presence of automatic (implantable) cardiac defibrillator: Secondary | ICD-10-CM

## 2020-05-04 DIAGNOSIS — Z8616 Personal history of COVID-19: Secondary | ICD-10-CM

## 2020-05-04 NOTE — Progress Notes (Signed)
Remote ICD transmission.   

## 2020-05-05 DIAGNOSIS — I4891 Unspecified atrial fibrillation: Secondary | ICD-10-CM | POA: Diagnosis not present

## 2020-05-05 DIAGNOSIS — K5730 Diverticulosis of large intestine without perforation or abscess without bleeding: Secondary | ICD-10-CM | POA: Diagnosis not present

## 2020-05-05 DIAGNOSIS — G8929 Other chronic pain: Secondary | ICD-10-CM | POA: Diagnosis not present

## 2020-05-05 DIAGNOSIS — I129 Hypertensive chronic kidney disease with stage 1 through stage 4 chronic kidney disease, or unspecified chronic kidney disease: Secondary | ICD-10-CM | POA: Diagnosis not present

## 2020-05-05 DIAGNOSIS — N1831 Chronic kidney disease, stage 3a: Secondary | ICD-10-CM | POA: Diagnosis not present

## 2020-05-05 DIAGNOSIS — K551 Chronic vascular disorders of intestine: Secondary | ICD-10-CM | POA: Diagnosis not present

## 2020-05-06 DIAGNOSIS — I129 Hypertensive chronic kidney disease with stage 1 through stage 4 chronic kidney disease, or unspecified chronic kidney disease: Secondary | ICD-10-CM | POA: Diagnosis not present

## 2020-05-06 DIAGNOSIS — K551 Chronic vascular disorders of intestine: Secondary | ICD-10-CM | POA: Diagnosis not present

## 2020-05-06 DIAGNOSIS — G8929 Other chronic pain: Secondary | ICD-10-CM | POA: Diagnosis not present

## 2020-05-06 DIAGNOSIS — N1831 Chronic kidney disease, stage 3a: Secondary | ICD-10-CM | POA: Diagnosis not present

## 2020-05-06 DIAGNOSIS — I4891 Unspecified atrial fibrillation: Secondary | ICD-10-CM | POA: Diagnosis not present

## 2020-05-06 DIAGNOSIS — K5730 Diverticulosis of large intestine without perforation or abscess without bleeding: Secondary | ICD-10-CM | POA: Diagnosis not present

## 2020-05-07 ENCOUNTER — Telehealth: Payer: Self-pay | Admitting: Nurse Practitioner

## 2020-05-07 NOTE — Telephone Encounter (Signed)
Spoke with patient and have rescheduled the Palliative Consult for 06/01/20 @ 12:30 PM

## 2020-05-07 NOTE — Telephone Encounter (Signed)
Rec'd call from patient's son [name] and he needed to reschedule the 05/11/20 Palliative Consult visit, patient has another MD appointment that day.  He requested that we call the patient to schedule any visits because she has all her appointments written down and the children do not have that information.  Told son that I would call patient back to reschedule visit.

## 2020-05-10 DIAGNOSIS — K551 Chronic vascular disorders of intestine: Secondary | ICD-10-CM | POA: Diagnosis not present

## 2020-05-10 DIAGNOSIS — I4891 Unspecified atrial fibrillation: Secondary | ICD-10-CM | POA: Diagnosis not present

## 2020-05-10 DIAGNOSIS — G8929 Other chronic pain: Secondary | ICD-10-CM | POA: Diagnosis not present

## 2020-05-10 DIAGNOSIS — K5730 Diverticulosis of large intestine without perforation or abscess without bleeding: Secondary | ICD-10-CM | POA: Diagnosis not present

## 2020-05-10 DIAGNOSIS — N1831 Chronic kidney disease, stage 3a: Secondary | ICD-10-CM | POA: Diagnosis not present

## 2020-05-10 DIAGNOSIS — I129 Hypertensive chronic kidney disease with stage 1 through stage 4 chronic kidney disease, or unspecified chronic kidney disease: Secondary | ICD-10-CM | POA: Diagnosis not present

## 2020-05-11 ENCOUNTER — Other Ambulatory Visit: Payer: Self-pay | Admitting: Nurse Practitioner

## 2020-05-11 DIAGNOSIS — M79676 Pain in unspecified toe(s): Secondary | ICD-10-CM | POA: Diagnosis not present

## 2020-05-11 DIAGNOSIS — I70203 Unspecified atherosclerosis of native arteries of extremities, bilateral legs: Secondary | ICD-10-CM | POA: Diagnosis not present

## 2020-05-11 DIAGNOSIS — B351 Tinea unguium: Secondary | ICD-10-CM | POA: Diagnosis not present

## 2020-05-11 DIAGNOSIS — L84 Corns and callosities: Secondary | ICD-10-CM | POA: Diagnosis not present

## 2020-05-12 ENCOUNTER — Encounter: Payer: Self-pay | Admitting: "Endocrinology

## 2020-05-12 ENCOUNTER — Other Ambulatory Visit: Payer: Self-pay

## 2020-05-12 ENCOUNTER — Ambulatory Visit (INDEPENDENT_AMBULATORY_CARE_PROVIDER_SITE_OTHER): Payer: Medicare Other | Admitting: "Endocrinology

## 2020-05-12 VITALS — BP 148/82 | HR 52 | Ht 63.0 in | Wt 120.8 lb

## 2020-05-12 DIAGNOSIS — E039 Hypothyroidism, unspecified: Secondary | ICD-10-CM

## 2020-05-12 DIAGNOSIS — E2749 Other adrenocortical insufficiency: Secondary | ICD-10-CM

## 2020-05-12 DIAGNOSIS — I25119 Atherosclerotic heart disease of native coronary artery with unspecified angina pectoris: Secondary | ICD-10-CM | POA: Diagnosis not present

## 2020-05-12 NOTE — Progress Notes (Signed)
05/12/2020, 3:00 PM           Endocrinology follow-up note  [name] is a 87 y.o.-year-old female patient who follows in this clinic for hypothyroidism comes in today for a different reason. PCP: Donette Larry, DO   Past Medical History:  Diagnosis Date  . AICD (automatic cardioverter/defibrillator) present   . Allergy    SESONAL  . Anxiety   . ARTHRITIS   . Arthritis   . Atrial fibrillation ([place])   . CAD (coronary artery disease)    a. history of cardiac arrest 1993;  b. s/p LAD/LCX stenting in 2003;  c. 07/2011 Cath: LM nl, LAD patent stent, D1 80ost, LCX patent stent, RCA min irregs;  d. 04/2012 MV: EF 66%, no ishcemia;  e. 12/2013 Echo: Ef 55%, no rwma, Gr 1 DD, triv AI/MR, mildly dil LA;  f. 12/2013 Lexi MV: intermediate risk - apical ischemia and inf/infsept fixed defect, ? artifactual.  . CAD in native artery 12/31/2013   Previous stents to LAD and Ramus patent on cath today 12/31/13 also There is severe disease in the ostial first diagonal which is unchanged from most recent cardiac catheterization. The right coronary artery could not be engaged selectively but nonselective angiography showed no significant disease in the proximal and midsegment.     . Cataract    DENIES  . Chronic back pain   . Chronic sinus bradycardia   . CKD (chronic kidney disease), stage III ([place])   . Clotting disorder (HCC)    DVT  . COLITIS 12/02/2007   Qualifier: Diagnosis of  By: Nils Pyle CMA ([place]), [name]    . Diabetes mellitus without complication ([place])    DENIES  . Diverticulosis of colon (without mention of hemorrhage) 2007   Colonoscopy   . Esophageal stricture    a. 2012 s/p dil.  . Esophagitis, unspecified    a. 2012 EGD  . EXTERNAL HEMORRHOIDS   . GERD (gastroesophageal reflux disease)    omeprazole  . H/O hiatal hernia   . Hiatal hernia    a. 2012 EGD.  Marland Kitchen History of  DVT in the past, not on Coumadin now    left  leg  . HYPERCHOLESTEROLEMIA   . Hypertension   . ICD (implantable cardiac defibrillator) in place    a. s/p initial ICD in 1993 in setting of cardiac arrest;  b. 01/2007 gen change: Guidant T135 Vitality DS VR single lead ICD.  . Macular degeneration    gets injecton in eye every 5 weeks- last injection - 05/03/2013   . Myocardial infarction ([place]) 1993  . Near syncope 05/22/2015  . Nephrolithiasis, just saw Dr [name]   . Orthostatic hypotension   . Peripheral vascular disease ([place])    ???  . RECTAL BLEEDING 12/03/2007   Qualifier: Diagnosis of  By: Sharlett Iles MD Byrd Hesselbach   . Unspecified gastritis and gastroduodenitis without  mention of hemorrhage    a. 2003 EGD->not noted on 2012 EGD.  Marland Kitchen Unstable angina, neg MI, cath stable.maybe GI 12/30/2013    Past Surgical History:  Procedure Laterality Date  . BREAST BIOPSY Left 11/2013  . CARDIAC CATHETERIZATION    . CARDIAC CATHETERIZATION  01/01/15   patent stents -there is severe disease in ostial 1st diag but without change.   Marland Kitchen CARDIAC DEFIBRILLATOR PLACEMENT  02/05/2007   Guidant  . CARDIOVASCULAR STRESS TEST  12/30/2013   abnormal  . CARDIOVERSION N/A 02/13/2019   Procedure: CARDIOVERSION;  Surgeon: Sanda Klein, MD;  Location: [facility];  Service: Cardiovascular;  Laterality: N/A;  . CATARACT EXTRACTION Right    Dr. Katy Fitch  . COLONOSCOPY    . COLONOSCOPY WITH PROPOFOL N/A 03/30/2020   Procedure: COLONOSCOPY WITH PROPOFOL;  Surgeon: Harvel Quale, MD;  [facility];  Service: Gastroenterology;  Laterality: N/A;  . coronary stents     . CYSTOSCOPY WITH URETEROSCOPY Right 05/20/2013   Procedure: CYSTOSCOPY, RIGHT URETEROSCOPY STONE EXTRACTION, Insertion of right DOUBLE J STENT ;  Surgeon: Irine Seal, MD;  Location: [facility];  Service: Urology;  Laterality: Right;  . CYSTOSCOPY WITH URETEROSCOPY AND STENT PLACEMENT N/A 06/03/2013   Procedure: SECOND LOOK  CYSTOSCOPY WITH URETEROSCOPY  HOLMIUM LASER LITHO AND STONE EXTRACTION Sammie Bench ;  Surgeon: Malka So, MD;  Location: [facility];  Service: Urology;  Laterality: N/A;  . ESOPHAGOGASTRODUODENOSCOPY (EGD) WITH PROPOFOL N/A 11/20/2019   Procedure: ESOPHAGOGASTRODUODENOSCOPY (EGD) WITH PROPOFOL;  Surgeon: Daneil Dolin, MD;  [facility];  Service: Endoscopy;  Laterality: N/A;  . GIVENS CAPSULE STUDY N/A 10/30/2016   Procedure: GIVENS CAPSULE STUDY;  Surgeon: Irene Shipper, MD;  Location: [facility];  Service: Endoscopy;  Laterality: N/A;  NEEDS TO BE ADMITTED FOR OBSERVATION PT. HAS DEFIB  . HEMORROIDECTOMY  80's  . HOLMIUM LASER APPLICATION Right [id number]   Procedure: HOLMIUM LASER APPLICATION;  Surgeon: Irine Seal, MD;  Location: [facility];  Service: Urology;  Laterality: Right;  . HYSTEROSCOPY WITH D & C  09/02/2010   Procedure: DILATATION AND CURETTAGE (D&C) /HYSTEROSCOPY;  Surgeon: Margarette Asal;  Location: [facility];  Service: Gynecology;  Laterality: N/A;  Dilation and Curettage with Hysteroscopy and Polypectomy  . IMPLANTABLE CARDIOVERTER DEFIBRILLATOR (ICD) GENERATOR CHANGE N/A 02/10/2014   Procedure: ICD GENERATOR CHANGE;  Surgeon: Sanda Klein, MD;  Location: [facility];  Service: Cardiovascular;  Laterality: N/A;  . LEFT HEART CATHETERIZATION WITH CORONARY ANGIOGRAM N/A 07/28/2011   Procedure: LEFT HEART CATHETERIZATION WITH CORONARY ANGIOGRAM;  Surgeon: Lorretta Harp, MD;  Location: [facility] CATH LAB;  Service: Cardiovascular;  Laterality: N/A;  . LEFT HEART CATHETERIZATION WITH CORONARY ANGIOGRAM N/A 12/31/2013   Procedure: LEFT HEART CATHETERIZATION WITH CORONARY ANGIOGRAM;  Surgeon: Wellington Hampshire, MD;  Location: [facility] CATH LAB;  Service: Cardiovascular;  Laterality: N/A;    Social History   Socioeconomic History  . Marital status: Widowed    Spouse name: Not on file  . Number of children: 4  . Years of education: 8  . Highest education level: 8th grade  Occupational  History  . Occupation: Retired  Tobacco Use  . Smoking status: Never Smoker  . Smokeless tobacco: Never Used  Vaping Use  . Vaping Use: Never used  Substance and Sexual Activity  . Alcohol use: No  . Drug use: No  . Sexual activity: Never    Birth control/protection: Post-menopausal  Other Topics Concern  . Not on  file  Social History Narrative  . Not on file   Social Determinants of Health   Financial Resource Strain: Not on file  Food Insecurity: Not on file  Transportation Needs: Not on file  Physical Activity: Not on file  Stress: Not on file  Social Connections: Not on file    Family History  Problem Relation Age of Onset  . Stroke Mother   . Other Mother        brain tumor  . Other Father        MI  . Heart attack Father   . Stroke Sister   . Macular degeneration Sister   . Diabetes Daughter   . Cancer Daughter        ovarian  . Colon polyps Daughter   . Atrial fibrillation Sister   . Hyperlipidemia Sister   . Osteoporosis Sister   . Stroke Sister   . Uterine cancer Sister   . Heart attack Brother   . Heart disease Brother   . Asthma Brother   . Breast cancer Neg Hx     Outpatient Encounter Medications as of 05/12/2020  Medication Sig  . acetaminophen (TYLENOL) 500 MG tablet Take 1 tablet (500 mg total) by mouth every 6 (six) hours as needed for mild pain, fever or headache.  Marland Kitchen apixaban (ELIQUIS) 2.5 MG TABS tablet Take 1 tablet (2.5 mg total) by mouth 2 (two) times daily.  . carboxymethylcellulose (REFRESH PLUS) 0.5 % SOLN Place 1 drop into both eyes 3 (three) times daily as needed (dry/irritated eyes.).  Marland Kitchen Cholecalciferol (VITAMIN D) 50 MCG (2000 UT) CAPS Take 2,000 Units by mouth daily.   . feeding supplement (ENSURE ENLIVE / ENSURE PLUS) LIQD Take 237 mLs by mouth 2 (two) times daily between meals.  Marland Kitchen HYDROcodone-acetaminophen (NORCO/VICODIN) 5-325 MG tablet TAKE 1 TABLET EVERY 4 HOURS AS NEEDED FOR MODERATE PAIN  . isosorbide mononitrate (IMDUR)  30 MG 24 hr tablet Take 1 tablet (30 mg total) by mouth daily.  Marland Kitchen levothyroxine (SYNTHROID) 100 MCG tablet Take 1 tablet (100 mcg total) by mouth daily.  Marland Kitchen loratadine (CLARITIN) 10 MG tablet Take 1 tablet (10 mg total) by mouth daily as needed for allergies, rhinitis or itching. (for itching)  . meclizine (ANTIVERT) 25 MG tablet Take 25 mg by mouth 3 (three) times daily as needed for dizziness or nausea.  . metoprolol tartrate (LOPRESSOR) 25 MG tablet Take 0.5 tablets (12.5 mg total) by mouth 2 (two) times daily. HOLD FOR SBP<95 OR HR<65  . Multiple Vitamins-Minerals (CENTRUM SILVER ULTRA WOMENS PO) Take 1 tablet by mouth daily.  . nitroGLYCERIN (NITROSTAT) 0.4 MG SL tablet Place 1 tablet (0.4 mg total) under the tongue every 5 (five) minutes as needed for chest pain.  . pantoprazole (PROTONIX) 40 MG tablet Take 1 tablet (40 mg total) by mouth 2 (two) times daily before a meal.  . PARoxetine (PAXIL) 20 MG tablet TAKE (1) TABLET DAILY IN THE MORNING.  . rosuvastatin (CRESTOR) 10 MG tablet Take 1 tablet (10 mg total) by mouth daily.  . [DISCONTINUED] tamsulosin (FLOMAX) 0.4 MG CAPS capsule Take 1 capsule (0.4 mg total) by mouth daily.   No facility-administered encounter medications on file as of 05/12/2020.    ALLERGIES: Allergies  Allergen Reactions  . Sotalol Other (See Comments)    Torsades   . Tramadol Other (See Comments)    Hallucination, bad-dream  . Actos [Pioglitazone] Swelling  . Adhesive [Tape] Other (See Comments)    Causes sores  .  Atorvastatin Other (See Comments)    myalgia  . Bactrim [Sulfamethoxazole-Trimethoprim] Itching and Rash    Itchy rash  . Ciprofloxacin Nausea Only  . Contrast Media [Iodinated Diagnostic Agents] Other (See Comments)    Headache (no action/pre-med required)  . Latex Rash  . Pedi-Pre Tape Spray [Wound Dressing Adhesive] Other (See Comments)    Causes sores  . Sulfa Antibiotics Other (See Comments)    Causes sores   VACCINATION  STATUS: Immunization History  Administered Date(s) Administered  . Fluad Quad(high Dose 65+) 10/23/2018, 01/05/2020  . Influenza, High Dose Seasonal PF 11/01/2015, 11/17/2016, 11/13/2017  . Influenza,inj,Quad PF,6+ Mos 11/11/2013, 10/21/2014  . Influenza-Unspecified 11/16/2012, 10/23/2016  . Pneumococcal Conjugate-13 11/18/2012  . Pneumococcal Polysaccharide-23 01/17/2003  . Tdap 05/09/2010  . Zoster 05/01/2011     HPI    [name]  is a patient with the above medical history. she is on ongoing treatment for hypothyroidism with levothyroxine 100 mcg p.o. daily before breakfast.  Recently, she was hospitalized for abdominal pain.  Work-up revealed some mild hypocortisolism.  She is not diagnosed with adrenal insufficiency yet.   She is not on steroids.  She does not remember adrenal surgery nor infections.  She has poor appetite, mainly due to the fact that she cannot finish her full meals.  She lost 19 pounds since last year.  She denies diarrhea, nausea, nor abdominal pain at this time.  Patient still complains of disequilibrium, and giddiness at times.  Pt denies feeling nodules in neck, hoarseness, dysphagia/odynophagia, SOB with lying down.  she denies family history of  thyroid disorders.  No family history of thyroid cancer.  No history of  radiation therapy to head or neck. -  No history of autoimmune thyroid dysfunction.     ROS: Limited as above.   Physical Exam: BP (!) 148/82   Pulse (!) 52   Ht '5\' 3"'$  (1.6 m)   Wt 120 lb 12.8 oz (54.8 kg)   BMI 21.40 kg/m  Wt Readings from Last 3 Encounters:  05/12/20 120 lb 12.8 oz (54.8 kg)  05/03/20 122 lb 6.4 oz (55.5 kg)  03/28/20 128 lb 15.5 oz (58.5 kg)       CMP ( most recent) CMP     Component Value Date/Time   NA 137 04/05/2020 0833   NA 137 03/23/2020 1046   K 3.9 04/05/2020 0833   CL 106 04/05/2020 0833   CO2 23 04/05/2020 0833   GLUCOSE 129 (H) 04/05/2020 0833   BUN 25 (H) 04/05/2020 0833   BUN  12 03/23/2020 1046   CREATININE 1.15 (H) 04/05/2020 0833   CREATININE 1.52 (H) 07/31/2012 1019   CALCIUM 9.6 04/05/2020 0833   PROT 6.0 (L) 03/29/2020 0551   PROT 6.4 03/23/2020 1046   ALBUMIN 2.8 (L) 03/29/2020 0551   ALBUMIN 3.4 (L) 03/23/2020 1046   AST 39 03/29/2020 0551   ALT 29 03/29/2020 0551   ALKPHOS 58 03/29/2020 0551   BILITOT 0.5 03/29/2020 0551   BILITOT 0.5 03/23/2020 1046   GFRNONAA 46 (L) 04/05/2020 0833   GFRNONAA 33 (L) 07/31/2012 1019   GFRAA 36 (L) 02/05/2020 1111   GFRAA 38 (L) 07/31/2012 1019     Diabetic Labs (most recent): Lab Results  Component Value Date   HGBA1C 6.6 08/15/2019   HGBA1C 7.0 (H) 10/23/2018   HGBA1C 7.1 (H) 07/12/2018     Lipid Panel ( most recent) Lipid Panel     Component Value Date/Time   CHOL 164  12/23/2019 2104   CHOL 139 10/23/2018 0947   CHOL 140 07/31/2012 1019   TRIG 61 12/23/2019 2104   TRIG 115 06/07/2015 1111   TRIG 161 (H) 07/31/2012 1019   HDL 66 12/23/2019 2104   HDL 53 10/23/2018 0947   HDL 68 06/07/2015 1111   HDL 43 07/31/2012 1019   CHOLHDL 2.5 12/23/2019 2104   VLDL 12 12/23/2019 2104   LDLCALC 86 12/23/2019 2104   LDLCALC 65 10/23/2018 0947   LDLCALC 65 09/09/2013 0841   LDLCALC 65 07/31/2012 1019   LABVLDL 21 10/23/2018 0947       Lab Results  Component Value Date   TSH 1.830 03/23/2020   TSH 6.180 (H) 02/05/2020   TSH 7.000 (H) 01/05/2020   TSH 6.950 (H) 09/24/2019   TSH 14.400 (H) 08/15/2019   TSH 56.294 (H) 07/25/2019   TSH 51.373 (H) 07/09/2019   TSH 5.095 (H) 03/11/2019   TSH 3.439 02/05/2018   TSH 3.300 12/31/2017   FREET4 1.50 09/24/2019   FREET4 0.66 07/26/2019   FREET4 1.20 12/07/2009    April 02, 2018 2 AM cortisol is 5.4 March 30, 2018 2 AM cortisol 5.3   ASSESSMENT:  1.  Hypocortisolism 2. 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 -ACTH  stimulation test will be performed at [facility] stay early next week and patient will be back on Friday for discussion and treatment decisions if necessary.   -Her recent thyroid function test from March 23, 2020 was consistent with appropriate replacement.  She is advised to continue levothyroxine 100 mcg p.o. daily before breakfast.  She will need repeat thyroid function test for dose adjustment after she clears #2 below.   - We discussed about the correct intake of her thyroid hormone, on empty stomach at fasting, with water, separated by at least 30 minutes from breakfast and other medications,  and separated by more than 4 hours from calcium, iron, multivitamins, acid reflux medications (PPIs). -Patient is made aware of the fact that thyroid hormone replacement is needed for life, dose to be adjusted by periodic monitoring of thyroid function tests.   -Due to absence of clinical goiter, no need for thyroid ultrasound. -I also have a long discussion about the need to stay on amiodarone based on the cardiology recommendation as necessary.   She is advised to maintain close follow-up with her PCP Dr. Lajuana Ripple.   I spent 35 minutes in the care of the patient today including review of labs from Thyroid Function, CMP, and other relevant labs ; imaging/biopsy records (current and previous including abstractions from other facilities); face-to-face time discussing  her lab results and symptoms, medications doses, her options of short and long term treatment based on the latest standards of care / guidelines;   and documenting the encounter.  Etheleen Nicks  participated in the discussions, expressed understanding, and voiced agreement with the above plans.  All questions were answered to her satisfaction. she is encouraged to contact clinic should she have any questions or concerns prior to her return visit.   Return in about 9 days (around 05/21/2020) for F/U with Pre-visit  Labs.  Glade Lloyd, MD [facility] [address] [place], [place] [address] Phone: [phone]  Fax: [phone]   05/12/2020, 3:00 PM  This note was partially dictated with voice recognition software. Similar sounding words can be transcribed inadequately or  may not  be corrected upon review.

## 2020-05-14 DIAGNOSIS — I5032 Chronic diastolic (congestive) heart failure: Secondary | ICD-10-CM | POA: Diagnosis not present

## 2020-05-14 DIAGNOSIS — N1831 Chronic kidney disease, stage 3a: Secondary | ICD-10-CM | POA: Diagnosis not present

## 2020-05-14 DIAGNOSIS — N200 Calculus of kidney: Secondary | ICD-10-CM | POA: Diagnosis not present

## 2020-05-14 DIAGNOSIS — I129 Hypertensive chronic kidney disease with stage 1 through stage 4 chronic kidney disease, or unspecified chronic kidney disease: Secondary | ICD-10-CM | POA: Diagnosis not present

## 2020-05-14 DIAGNOSIS — N280 Ischemia and infarction of kidney: Secondary | ICD-10-CM | POA: Diagnosis not present

## 2020-05-14 DIAGNOSIS — R809 Proteinuria, unspecified: Secondary | ICD-10-CM | POA: Diagnosis not present

## 2020-05-15 ENCOUNTER — Other Ambulatory Visit: Payer: Self-pay | Admitting: Cardiovascular Disease

## 2020-05-17 DIAGNOSIS — K551 Chronic vascular disorders of intestine: Secondary | ICD-10-CM | POA: Diagnosis not present

## 2020-05-17 DIAGNOSIS — N1831 Chronic kidney disease, stage 3a: Secondary | ICD-10-CM | POA: Diagnosis not present

## 2020-05-17 DIAGNOSIS — I4891 Unspecified atrial fibrillation: Secondary | ICD-10-CM | POA: Diagnosis not present

## 2020-05-17 DIAGNOSIS — I129 Hypertensive chronic kidney disease with stage 1 through stage 4 chronic kidney disease, or unspecified chronic kidney disease: Secondary | ICD-10-CM | POA: Diagnosis not present

## 2020-05-17 DIAGNOSIS — G8929 Other chronic pain: Secondary | ICD-10-CM | POA: Diagnosis not present

## 2020-05-17 DIAGNOSIS — K5730 Diverticulosis of large intestine without perforation or abscess without bleeding: Secondary | ICD-10-CM | POA: Diagnosis not present

## 2020-05-18 ENCOUNTER — Other Ambulatory Visit: Payer: Self-pay

## 2020-05-18 ENCOUNTER — Encounter: Payer: Self-pay | Admitting: Gastroenterology

## 2020-05-18 ENCOUNTER — Ambulatory Visit (INDEPENDENT_AMBULATORY_CARE_PROVIDER_SITE_OTHER): Payer: Medicare Other | Admitting: Gastroenterology

## 2020-05-18 VITALS — BP 121/73 | HR 60 | Temp 97.2°F | Ht 63.0 in | Wt 119.6 lb

## 2020-05-18 DIAGNOSIS — R109 Unspecified abdominal pain: Secondary | ICD-10-CM

## 2020-05-18 DIAGNOSIS — R634 Abnormal weight loss: Secondary | ICD-10-CM

## 2020-05-18 DIAGNOSIS — I25119 Atherosclerotic heart disease of native coronary artery with unspecified angina pectoris: Secondary | ICD-10-CM | POA: Diagnosis not present

## 2020-05-18 NOTE — Patient Instructions (Signed)
I am glad you are doing better!  I would like to see you in 6-8 weeks.   Keep trying to add 2 Ensure shakes a day between meals. Please call if further weight loss.  I enjoyed seeing you again today! As you know, I value our relationship and want to provide genuine, compassionate, and quality care. I welcome your feedback. If you receive a survey regarding your visit,  I greatly appreciate you taking time to fill this out. See you next time!  Annitta Needs, PhD, ANP-BC [facility]

## 2020-05-18 NOTE — Progress Notes (Signed)
Referring Provider: [name], DO Primary Care Physician:  [name], DO Primary GI: Dr. Abbey Chatters  Chief Complaint  Patient presents with  . Abdominal Pain    Comes/goes a little depending on what she eats    HPI:   [name] is an 87 y.o. female presenting today with a history of chronic abdominal pain, inpatient March 2022 with acutely worsening postprandial abdominal pain and nausea. Evaluation thus far extensive and included EGD Nov 2021, CTA Nov 2021, US abdomen without gallstones, HIDA scan unrevealing, colonoscopy recent admission with 6 mm carpet-like polyp not removed as was on Eliquis, repeat CTA without evidence for large vessel disease, unable to tolerate Buspar due to hallucinations, not a candidate for TCAs in the past with prolonged QTC, and has noted most improvement with Imdur 30 mg daily.Suspected microvascular angina vs bowel hypersensitivity. Low cortisol level during admission and has seen Endocrine with plans for ACTH stimulation test at [facility].  History of pancreatic lesion seen on CT renal stone study and unchanged from 2020 with surveillance due in 2023.    Ate chicken, rice, and carrots big plate yesterday. Drinks one ensure a day in between small meals. Sometimes will drink 2 ensure and sometimes not. Has 4 children, and one goes every night to make sure she has dinner and not just a snack. Breakfast will eat fruit: enjoys mandarin oranges, grapes. Lunch: sometimes a sandwich or soup. Mild abdominal knotting after eating but much improved. Remains on Imdur 15 mg daily. Protonix BID.   Weight today is 119.6. This is down from a weight of mid 130s in March 2022.   Past Medical History:  Diagnosis Date  . AICD (automatic cardioverter/defibrillator) present   . Allergy    SESONAL  . Anxiety   . ARTHRITIS   . Arthritis   . Atrial fibrillation ([place])   . CAD (coronary artery disease)    a. history of cardiac arrest 1993;  b. s/p  LAD/LCX stenting in 2003;  c. 07/2011 Cath: LM nl, LAD patent stent, D1 80ost, LCX patent stent, RCA min irregs;  d. 04/2012 MV: EF 66%, no ishcemia;  e. 12/2013 Echo: Ef 55%, no rwma, Gr 1 DD, triv AI/MR, mildly dil LA;  f. 12/2013 Lexi MV: intermediate risk - apical ischemia and inf/infsept fixed defect, ? artifactual.  . CAD in native artery 12/31/2013   Previous stents to LAD and Ramus patent on cath today 12/31/13 also There is severe disease in the ostial first diagonal which is unchanged from most recent cardiac catheterization. The right coronary artery could not be engaged selectively but nonselective angiography showed no significant disease in the proximal and midsegment.     . Cataract    DENIES  . Chronic back pain   . Chronic sinus bradycardia   . CKD (chronic kidney disease), stage III ([place])   . Clotting disorder (H[facility])    DVT  . COLITIS 12/02/2007   Qualifier: Diagnosis of  By: Nils Pyle CMA ([place]), [name]    . Diabetes mellitus without complication ([place])    DENIES  . Diverticulosis of colon (without mention of hemorrhage) 2007   Colonoscopy   . Esophageal stricture    a. 2012 s/p dil.  . Esophagitis, unspecified    a. 2012 EGD  . EXTERNAL HEMORRHOIDS   . GERD (gastroesophageal reflux disease)    omeprazole  . H/O hiatal hernia   . Hiatal hernia    a. 2012 EGD.  Marland Kitchen History  of DVT in the past, not on Coumadin now    left  leg  . HYPERCHOLESTEROLEMIA   . Hypertension   . ICD (implantable cardiac defibrillator) in place    a. s/p initial ICD in 1993 in setting of cardiac arrest;  b. 01/2007 gen change: Guidant T135 Vitality DS VR single lead ICD.  . Macular degeneration    gets injecton in eye every 5 weeks- last injection - 05/03/2013   . Myocardial infarction ([place]) 1993  . Near syncope 05/22/2015  . Nephrolithiasis, just saw Dr [name]   . Orthostatic hypotension   . Peripheral vascular disease ([place])    ???  . RECTAL BLEEDING 12/03/2007   Qualifier: Diagnosis of  By:  Sharlett Iles MD Byrd Hesselbach   . Unspecified gastritis and gastroduodenitis without mention of hemorrhage    a. 2003 EGD->not noted on 2012 EGD.  Marland Kitchen Unstable angina, neg MI, cath stable.maybe GI 12/30/2013    Past Surgical History:  Procedure Laterality Date  . BREAST BIOPSY Left 11/2013  . CARDIAC CATHETERIZATION    . CARDIAC CATHETERIZATION  01/01/15   patent stents -there is severe disease in ostial 1st diag but without change.   Marland Kitchen CARDIAC DEFIBRILLATOR PLACEMENT  02/05/2007   Guidant  . CARDIOVASCULAR STRESS TEST  12/30/2013   abnormal  . CARDIOVERSION N/A 02/13/2019   Procedure: CARDIOVERSION;  Surgeon: Sanda Klein, MD;  Location: [place];  Service: Cardiovascular;  Laterality: N/A;  . CATARACT EXTRACTION Right    Dr. Katy Fitch  . COLONOSCOPY    . COLONOSCOPY WITH PROPOFOL N/A 03/30/2020   Procedure: COLONOSCOPY WITH PROPOFOL;  Surgeon: Harvel Quale, MD;  [facility];  Service: Gastroenterology;  Laterality: N/A;  . coronary stents     . CYSTOSCOPY WITH URETEROSCOPY Right 05/20/2013   Procedure: CYSTOSCOPY, RIGHT URETEROSCOPY STONE EXTRACTION, Insertion of right DOUBLE J STENT ;  Surgeon: Irine Seal, MD;  Location: [facility];  Service: Urology;  Laterality: Right;  . CYSTOSCOPY WITH URETEROSCOPY AND STENT PLACEMENT N/A 06/03/2013   Procedure: SECOND LOOK CYSTOSCOPY WITH URETEROSCOPY  HOLMIUM LASER LITHO AND STONE EXTRACTION Sammie Bench ;  Surgeon: Malka So, MD;  Location: [facility];  Service: Urology;  Laterality: N/A;  . ESOPHAGOGASTRODUODENOSCOPY (EGD) WITH PROPOFOL N/A 11/20/2019   Procedure: ESOPHAGOGASTRODUODENOSCOPY (EGD) WITH PROPOFOL;  Surgeon: Daneil Dolin, MD;  [facility];  Service: Endoscopy;  Laterality: N/A;  . GIVENS CAPSULE STUDY N/A 10/30/2016   Procedure: GIVENS CAPSULE STUDY;  Surgeon: Irene Shipper, MD;  Location: [place];  Service: Endoscopy;  Laterality: N/A;  NEEDS TO BE ADMITTED FOR OBSERVATION PT. HAS DEFIB  .  HEMORROIDECTOMY  80's  . HOLMIUM LASER APPLICATION Right [id number]   Procedure: HOLMIUM LASER APPLICATION;  Surgeon: Irine Seal, MD;  Location: [facility];  Service: Urology;  Laterality: Right;  . HYSTEROSCOPY WITH D & C  09/02/2010   Procedure: DILATATION AND CURETTAGE (D&C) /HYSTEROSCOPY;  Surgeon: Margarette Asal;  Location: [place] [facility];  Service: Gynecology;  Laterality: N/A;  Dilation and Curettage with Hysteroscopy and Polypectomy  . IMPLANTABLE CARDIOVERTER DEFIBRILLATOR (ICD) GENERATOR CHANGE N/A 02/10/2014   Procedure: ICD GENERATOR CHANGE;  Surgeon: Sanda Klein, MD;  Location: [place] [facility];  Service: Cardiovascular;  Laterality: N/A;  . LEFT HEART CATHETERIZATION WITH CORONARY ANGIOGRAM N/A 07/28/2011   Procedure: LEFT HEART CATHETERIZATION WITH CORONARY ANGIOGRAM;  Surgeon: Lorretta Harp, MD;  Location: [facility];  Service: Cardiovascular;  Laterality: N/A;  . LEFT HEART CATHETERIZATION  WITH CORONARY ANGIOGRAM N/A 12/31/2013   Procedure: LEFT HEART CATHETERIZATION WITH CORONARY ANGIOGRAM;  Surgeon: Wellington Hampshire, MD;  Location: [place] C[facility] LAB;  Service: Cardiovascular;  Laterality: N/A;    Current Outpatient Medications  Medication Sig Dispense Refill  . acetaminophen (TYLENOL) 500 MG tablet Take 1 tablet (500 mg total) by mouth every 6 (six) hours as needed for mild pain, fever or headache. 30 tablet 0  . apixaban (ELIQUIS) 2.5 MG TABS tablet Take 1 tablet (2.5 mg total) by mouth 2 (two) times daily. 60 tablet 5  . carboxymethylcellulose (REFRESH PLUS) 0.5 % SOLN Place 1 drop into both eyes 3 (three) times daily as needed (dry/irritated eyes.).    Marland Kitchen Cholecalciferol (VITAMIN D) 50 MCG (2000 UT) CAPS Take 2,000 Units by mouth daily.     Marland Kitchen docusate sodium (COLACE) 100 MG capsule Take 100 mg by mouth 2 (two) times daily.    . feeding supplement (ENSURE ENLIVE / ENSURE PLUS) LIQD Take 237 mLs by mouth 2 (two) times daily between meals.    Marland Kitchen HYDROcodone-acetaminophen (NORCO/VICODIN) 5-325 MG  tablet TAKE 1 TABLET EVERY 4 HOURS AS NEEDED FOR MODERATE PAIN 30 tablet 0  . isosorbide mononitrate (IMDUR) 30 MG 24 hr tablet TAKE ONE HALF (1/2) TABLET DAILY 45 tablet 2  . levothyroxine (SYNTHROID) 100 MCG tablet Take 1 tablet (100 mcg total) by mouth daily. 30 tablet 5  . loratadine (CLARITIN) 10 MG tablet Take 1 tablet (10 mg total) by mouth daily as needed for allergies, rhinitis or itching. (for itching) 30 tablet 11  . MELATONIN PO Take by mouth at bedtime.    . metoprolol tartrate (LOPRESSOR) 25 MG tablet Take 0.5 tablets (12.5 mg total) by mouth 2 (two) times daily. HOLD FOR SBP<95 OR HR<65    . Multiple Vitamins-Minerals (CENTRUM SILVER ULTRA WOMENS PO) Take 1 tablet by mouth daily.    . nitroGLYCERIN (NITROSTAT) 0.4 MG SL tablet Place 1 tablet (0.4 mg total) under the tongue every 5 (five) minutes as needed for chest pain. 25 tablet 2  . pantoprazole (PROTONIX) 40 MG tablet Take 1 tablet (40 mg total) by mouth 2 (two) times daily before a meal. 90 tablet 0  . PARoxetine (PAXIL) 20 MG tablet TAKE (1) TABLET DAILY IN THE MORNING. 90 tablet 0  . rosuvastatin (CRESTOR) 10 MG tablet Take 1 tablet (10 mg total) by mouth daily. 90 tablet 1  . doxycycline (VIBRA-TABS) 100 MG tablet Take 1 tablet (100 mg total) by mouth 2 (two) times daily. 20 tablet 0  . predniSONE (STERAPRED UNI-PAK 21 TAB) 10 MG (21) TBPK tablet Use as directed 21 tablet 0   No current facility-administered medications for this visit.    Allergies as of 05/18/2020 - Review Complete 05/18/2020  Allergen Reaction Noted  . Sotalol Other (See Comments) 11/01/2016  . Tramadol Other (See Comments) 12/23/2019  . Actos [pioglitazone] Swelling 07/31/2012  . Adhesive [tape] Other (See Comments) 02/20/2014  . Atorvastatin Other (See Comments) 07/31/2012  . Bactrim [sulfamethoxazole-trimethoprim] Itching and Rash 08/19/2016  . Ciprofloxacin Nausea Only 02/05/2018  . Contrast media [iodinated diagnostic agents] Other (See  Comments) 07/25/2011  . Latex Rash 05/29/2013  . Pedi-pre tape spray [wound dressing adhesive] Other (See Comments) 02/20/2014  . Sulfa antibiotics Other (See Comments) 02/20/2014    Family History  Problem Relation Age of Onset  . Stroke Mother   . Other Mother        brain tumor  . Other Father  MI  . Heart attack Father   . Stroke Sister   . Macular degeneration Sister   . Diabetes Daughter   . Cancer Daughter        ovarian  . Colon polyps Daughter   . Atrial fibrillation Sister   . Hyperlipidemia Sister   . Osteoporosis Sister   . Stroke Sister   . Uterine cancer Sister   . Heart attack Brother   . Heart disease Brother   . Asthma Brother   . Breast cancer Neg Hx     Social History   Socioeconomic History  . Marital status: Widowed    Spouse name: Not on file  . Number of children: 4  . Years of education: 8  . Highest education level: 8th grade  Occupational History  . Occupation: Retired  Tobacco Use  . Smoking status: Never Smoker  . Smokeless tobacco: Never Used  Vaping Use  . Vaping Use: Never used  Substance and Sexual Activity  . Alcohol use: No  . Drug use: No  . Sexual activity: Never    Birth control/protection: Post-menopausal  Other Topics Concern  . Not on file  Social History Narrative  . Not on file   Social Determinants of Health   Financial Resource Strain: Not on file  Food Insecurity: Not on file  Transportation Needs: Not on file  Physical Activity: Not on file  Stress: Not on file  Social Connections: Not on file    Review of Systems: Gen: see HPI CV: Denies chest pain, palpitations, syncope, peripheral edema, and claudication. Resp: Denies dyspnea at rest, cough, wheezing, coughing up blood, and pleurisy. GI: see HPI Derm: Denies rash, itching, dry skin Psych: Denies depression, anxiety, memory loss, confusion. No homicidal or suicidal ideation.  Heme: Denies bruising, bleeding, and enlarged lymph  nodes.  Physical Exam: BP 121/73   Pulse 60   Temp (!) 97.2 F (36.2 C)   Ht '5\' 3"'$  (1.6 m)   Wt 119 lb 9.6 oz (54.3 kg)   BMI 21.19 kg/m  General:   Alert and oriented. No distress noted. Pleasant and cooperative.  Head:  Normocephalic and atraumatic. Eyes:  Conjuctiva clear without scleral icterus. Mouth:  Mask in place Abdomen:  +BS, soft, non-tender and non-distended. No rebound or guarding. No HSM or masses noted. Msk:  Symmetrical without gross deformities. Normal posture. Extremities:  Without edema. Neurologic:  Alert and  oriented x4 Psych:  Alert and cooperative. Normal mood and affect.  ASSESSMENT: [name] is an 87 y.o. female presenting today with a history of chronic abdominal pain, inpatient March 2022 with acutely worsening postprandial abdominal pain and nausea. Evaluation thus far extensive and included EGD Nov 2021, CTA Nov 2021, US abdomen without gallstones, HIDA scan unrevealing, colonoscopy recent admission with 6 mm carpet-like polyp not removed as was on Eliquis, repeat CTA without evidence for large vessel disease, unable to tolerate Buspar due to hallucinations, not a candidate for TCAs in the past with prolonged QTC, and has noted most improvement with Imdur 30 mg daily.Suspected microvascular angina vs bowel hypersensitivity. Low cortisol level during admission and has seen Endocrine with plans for ACTH stimulation test at [facility].  History of pancreatic lesion seen on CT renal stone study and unchanged from 2020 with surveillance due in 2023.   Returns today with marked improvement in abdominal pain, remaining on Imdur 15 mg daily and notes a good appetite. Dietary recall with self-reported varieties of foods, and she has an  excellent support system helping her as well. Continues with Protonix BID. Weight today 119.6, and I have asked that she return for weight check in 6-8 weeks, along with adding Ensure twice a day between meals.   PLAN:  Continue  Imdur daily PPI BID Add Ensure two a day between meals Call if further weight loss Close follow-up in 6-8 weeks   Annitta Needs, PhD, [facility]

## 2020-05-19 DIAGNOSIS — N1831 Chronic kidney disease, stage 3a: Secondary | ICD-10-CM | POA: Diagnosis not present

## 2020-05-19 DIAGNOSIS — G8929 Other chronic pain: Secondary | ICD-10-CM | POA: Diagnosis not present

## 2020-05-19 DIAGNOSIS — K551 Chronic vascular disorders of intestine: Secondary | ICD-10-CM | POA: Diagnosis not present

## 2020-05-19 DIAGNOSIS — I4891 Unspecified atrial fibrillation: Secondary | ICD-10-CM | POA: Diagnosis not present

## 2020-05-19 DIAGNOSIS — K5730 Diverticulosis of large intestine without perforation or abscess without bleeding: Secondary | ICD-10-CM | POA: Diagnosis not present

## 2020-05-19 DIAGNOSIS — I129 Hypertensive chronic kidney disease with stage 1 through stage 4 chronic kidney disease, or unspecified chronic kidney disease: Secondary | ICD-10-CM | POA: Diagnosis not present

## 2020-05-20 ENCOUNTER — Encounter (HOSPITAL_COMMUNITY)
Admission: RE | Admit: 2020-05-20 | Discharge: 2020-05-20 | Disposition: A | Discharge status: Home / Self Care | Payer: Medicare Other | Source: Ambulatory Visit | Attending: "Endocrinology | Admitting: "Endocrinology

## 2020-05-20 ENCOUNTER — Other Ambulatory Visit: Payer: Self-pay

## 2020-05-20 ENCOUNTER — Telehealth: Payer: Self-pay | Admitting: Cardiovascular Disease

## 2020-05-20 DIAGNOSIS — E249 Cushing's syndrome, unspecified: Secondary | ICD-10-CM | POA: Insufficient documentation

## 2020-05-20 LAB — ACTH STIMULATION, 3 TIME POINTS
Cortisol, 30 Min: 34.2 ug/dL
Cortisol, 60 Min: 34 ug/dL
Cortisol, Base: 17.6 ug/dL

## 2020-05-20 MED ORDER — COSYNTROPIN 0.25 MG IJ SOLR
0.2500 mg | Freq: Once | INTRAMUSCULAR | Status: AC
  Administered 2020-05-20: 0.25 mg via INTRAVENOUS

## 2020-05-20 MED ORDER — COSYNTROPIN 0.25 MG IJ SOLR
INTRAMUSCULAR | Status: AC
  Filled 2020-05-20: qty 0.25

## 2020-05-20 NOTE — Telephone Encounter (Signed)
Patient called in with some question medication. Please advise

## 2020-05-20 NOTE — Telephone Encounter (Signed)
Calling to get name of a-fib medication-advised that eliquis and metoprolol were both for A-Fib. Confirmed that she is taking both.

## 2020-05-21 ENCOUNTER — Ambulatory Visit (INDEPENDENT_AMBULATORY_CARE_PROVIDER_SITE_OTHER): Payer: Medicare Other | Admitting: Family

## 2020-05-21 ENCOUNTER — Ambulatory Visit: Payer: Medicare Other | Admitting: "Endocrinology

## 2020-05-21 ENCOUNTER — Encounter: Payer: Self-pay | Admitting: Family

## 2020-05-21 DIAGNOSIS — R059 Cough, unspecified: Secondary | ICD-10-CM | POA: Diagnosis not present

## 2020-05-21 MED ORDER — DOXYCYCLINE HYCLATE 100 MG PO TABS
100.0000 mg | ORAL_TABLET | Freq: Two times a day (BID) | ORAL | 0 refills | Status: DC
Start: 2020-05-21 — End: 2020-06-20

## 2020-05-21 MED ORDER — PREDNISONE 10 MG (21) PO TBPK: ORAL_TABLET | ORAL | 0 refills | Status: DC

## 2020-05-21 NOTE — Progress Notes (Signed)
   Virtual Visit  Note Due to COVID-19 pandemic this visit was conducted virtually. This visit type was conducted due to national recommendations for restrictions regarding the COVID-19 Pandemic (e.g. social distancing, sheltering in place) in an effort to limit this patient's exposure and mitigate transmission in our community. All issues noted in this document were discussed and addressed.  A physical exam was not performed with this format.  I connected with [name] on 05/21/20 at 4:01 pm  by telephone and verified that I am speaking with the correct person using two identifiers. [name] is currently located at home and no one is currently with her during visit. The provider, Evelina Dun, FNP is located in their office at time of visit.  I discussed the limitations, risks, security and privacy concerns of performing an evaluation and management service by telephone and the availability of in person appointments. I also discussed with the patient that there may be a patient responsible charge related to this service. The patient expressed understanding and agreed to proceed.   History and Present Illness:  Cough This is a new problem. The current episode started yesterday. The problem has been gradually worsening. The problem occurs every few minutes. The cough is non-productive. Associated symptoms include nasal congestion and postnasal drip. Pertinent negatives include no chills, ear congestion, ear pain, fever, headaches, myalgias, shortness of breath or wheezing. She has tried rest and OTC cough suppressant for the symptoms. The treatment provided mild relief.      Review of Systems  Constitutional: Negative for chills and fever.  HENT: Positive for postnasal drip. Negative for ear pain.   Respiratory: Positive for cough. Negative for shortness of breath and wheezing.   Musculoskeletal: Negative for myalgias.  Neurological: Negative for headaches.  All other systems  reviewed and are negative.    Observations/Objective: No  SOB or distress noted, hoarse voice   Assessment and Plan: 1. Cough - Take meds as prescribed - Use a cool mist humidifier  -Use saline nose sprays frequently -Force fluids -For any cough or congestion  Use plain Mucinex- regular strength or max strength is fine -For fever or aces or pains- take tylenol or ibuprofen. -Throat lozenges if help RTO if symptoms worse or do not improve  - Novel Coronavirus, NAA ([facility]) - predniSONE (STERAPRED UNI-PAK 21 TAB) 10 MG (21) TBPK tablet; Use as directed  Dispense: 21 tablet; Refill: 0 - doxycycline (VIBRA-TABS) 100 MG tablet; Take 1 tablet (100 mg total) by mouth 2 (two) times daily.  Dispense: 20 tablet; Refill: 0    I discussed the assessment and treatment plan with the patient. The patient was provided an opportunity to ask questions and all were answered. The patient agreed with the plan and demonstrated an understanding of the instructions.   The patient was advised to call back or seek an in-person evaluation if the symptoms worsen or if the condition fails to improve as anticipated.  The above assessment and management plan was discussed with the patient. The patient verbalized understanding of and has agreed to the management plan. Patient is aware to call the clinic if symptoms persist or worsen. Patient is aware when to return to the clinic for a follow-up visit. Patient educated on when it is appropriate to go to the emergency department.   Time call ended:  4:12 pm  I provided 11 minutes of  non face-to-face time during this encounter.    Evelina Dun, FNP

## 2020-05-24 ENCOUNTER — Encounter: Payer: Self-pay | Admitting: "Endocrinology

## 2020-05-24 ENCOUNTER — Ambulatory Visit (INDEPENDENT_AMBULATORY_CARE_PROVIDER_SITE_OTHER): Payer: Medicare Other | Admitting: "Endocrinology

## 2020-05-24 ENCOUNTER — Other Ambulatory Visit: Payer: Self-pay

## 2020-05-24 VITALS — BP 142/78 | HR 56 | Ht 63.0 in | Wt 120.2 lb

## 2020-05-24 DIAGNOSIS — E2749 Other adrenocortical insufficiency: Secondary | ICD-10-CM | POA: Diagnosis not present

## 2020-05-24 DIAGNOSIS — E039 Hypothyroidism, unspecified: Secondary | ICD-10-CM | POA: Diagnosis not present

## 2020-05-24 DIAGNOSIS — I25119 Atherosclerotic heart disease of native coronary artery with unspecified angina pectoris: Secondary | ICD-10-CM

## 2020-05-24 NOTE — Progress Notes (Signed)
05/24/2020, 3:54 PM           Endocrinology follow-up note  ROSHELL [name] is a 87 y.o.-year-old female patient who is accompanied by her daughter to clinic to follow-up on her recent lab work-up for adrenal sufficiency.    She follows in this clinic for hypothyroidism regularly.      Past Medical History:  Diagnosis Date  . AICD (automatic cardioverter/defibrillator) present   . Allergy    SESONAL  . Anxiety   . ARTHRITIS   . Arthritis   . Atrial fibrillation ([place])   . CAD (coronary artery disease)    a. history of cardiac arrest 1993;  b. s/p LAD/LCX stenting in 2003;  c. 07/2011 Cath: LM nl, LAD patent stent, D1 80ost, LCX patent stent, RCA min irregs;  d. 04/2012 MV: EF 66%, no ishcemia;  e. 12/2013 Echo: Ef 55%, no rwma, Gr 1 DD, triv AI/MR, mildly dil LA;  f. 12/2013 Lexi MV: intermediate risk - apical ischemia and inf/infsept fixed defect, ? artifactual.  . CAD in native artery 12/31/2013   Previous stents to LAD and Ramus patent on cath today 12/31/13 also There is severe disease in the ostial first diagonal which is unchanged from most recent cardiac catheterization. The right coronary artery could not be engaged selectively but nonselective angiography showed no significant disease in the proximal and midsegment.     . Cataract    DENIES  . Chronic back pain   . Chronic sinus bradycardia   . CKD (chronic kidney disease), stage III ([place])   . Clotting disorder (HCC)    DVT  . COLITIS 12/02/2007   Qualifier: Diagnosis of  By: Nils Pyle CMA ([place]), [name]    . Diabetes mellitus without complication ([place])    DENIES  . Diverticulosis of colon (without mention of hemorrhage) 2007   Colonoscopy   . Esophageal stricture    a. 2012 s/p dil.  . Esophagitis, unspecified    a. 2012 EGD  . EXTERNAL HEMORRHOIDS   . GERD (gastroesophageal reflux disease)    omeprazole  . H/O  hiatal hernia   . Hiatal hernia    a. 2012 EGD.  Marland Kitchen History of DVT in the past, not on Coumadin now    left  leg  . HYPERCHOLESTEROLEMIA   . Hypertension   . ICD (implantable cardiac defibrillator) in place    a. s/p initial ICD in 1993 in setting of cardiac arrest;  b. 01/2007 gen change: Guidant T135 Vitality DS VR single lead ICD.  . Macular degeneration    gets injecton in eye every 5 weeks- last injection - 05/03/2013   . Myocardial infarction ([place]) 1993  . Near syncope 05/22/2015  . Nephrolithiasis, just saw Dr [name]   . Orthostatic hypotension   . Peripheral vascular disease ([place])    ???  . RECTAL BLEEDING 12/03/2007   Qualifier: Diagnosis of  By: Sharlett Iles MD Byrd Hesselbach Unspecified gastritis and gastroduodenitis without mention of hemorrhage    a. 2003 EGD->not noted on 2012 EGD.  Marland Kitchen Unstable angina, neg MI, cath stable.maybe GI 12/30/2013    Past Surgical History:  Procedure Laterality Date  . BREAST BIOPSY Left 11/2013  . CARDIAC CATHETERIZATION    . CARDIAC CATHETERIZATION  01/01/15   patent stents -there is severe disease in ostial 1st diag but without change.   Marland Kitchen CARDIAC DEFIBRILLATOR PLACEMENT  02/05/2007   Guidant  . CARDIOVASCULAR STRESS TEST  12/30/2013   abnormal  . CARDIOVERSION N/A 02/13/2019   Procedure: CARDIOVERSION;  Surgeon: Sanda Klein, MD;  Location: [facility];  Service: Cardiovascular;  Laterality: N/A;  . CATARACT EXTRACTION Right    Dr. Katy Fitch  . COLONOSCOPY    . COLONOSCOPY WITH PROPOFOL N/A 03/30/2020   Procedure: COLONOSCOPY WITH PROPOFOL;  Surgeon: Harvel Quale, MD;  [facility];  Service: Gastroenterology;  Laterality: N/A;  . coronary stents     . CYSTOSCOPY WITH URETEROSCOPY Right 05/20/2013   Procedure: CYSTOSCOPY, RIGHT URETEROSCOPY STONE EXTRACTION, Insertion of right DOUBLE J STENT ;  Surgeon: Irine Seal, MD;  Location: [facility];  Service: Urology;  Laterality: Right;  . CYSTOSCOPY WITH URETEROSCOPY  AND STENT PLACEMENT N/A 06/03/2013   Procedure: SECOND LOOK CYSTOSCOPY WITH URETEROSCOPY  HOLMIUM LASER LITHO AND STONE EXTRACTION Sammie Bench ;  Surgeon: Malka So, MD;  Location: [facility];  Service: Urology;  Laterality: N/A;  . ESOPHAGOGASTRODUODENOSCOPY (EGD) WITH PROPOFOL N/A 11/20/2019   Procedure: ESOPHAGOGASTRODUODENOSCOPY (EGD) WITH PROPOFOL;  Surgeon: Daneil Dolin, MD;  [facility];  Service: Endoscopy;  Laterality: N/A;  . GIVENS CAPSULE STUDY N/A 10/30/2016   Procedure: GIVENS CAPSULE STUDY;  Surgeon: Irene Shipper, MD;  Location: [facility];  Service: Endoscopy;  Laterality: N/A;  NEEDS TO BE ADMITTED FOR OBSERVATION PT. HAS DEFIB  . HEMORROIDECTOMY  80's  . HOLMIUM LASER APPLICATION Right [id number]   Procedure: HOLMIUM LASER APPLICATION;  Surgeon: Irine Seal, MD;  Location: [facility];  Service: Urology;  Laterality: Right;  . HYSTEROSCOPY WITH D & C  09/02/2010   Procedure: DILATATION AND CURETTAGE (D&C) /HYSTEROSCOPY;  Surgeon: Margarette Asal;  Location: [facility];  Service: Gynecology;  Laterality: N/A;  Dilation and Curettage with Hysteroscopy and Polypectomy  . IMPLANTABLE CARDIOVERTER DEFIBRILLATOR (ICD) GENERATOR CHANGE N/A 02/10/2014   Procedure: ICD GENERATOR CHANGE;  Surgeon: Sanda Klein, MD;  Location: [facility];  Service: Cardiovascular;  Laterality: N/A;  . LEFT HEART CATHETERIZATION WITH CORONARY ANGIOGRAM N/A 07/28/2011   Procedure: LEFT HEART CATHETERIZATION WITH CORONARY ANGIOGRAM;  Surgeon: Lorretta Harp, MD;  Location: [facility];  Service: Cardiovascular;  Laterality: N/A;  . LEFT HEART CATHETERIZATION WITH CORONARY ANGIOGRAM N/A 12/31/2013   Procedure: LEFT HEART CATHETERIZATION WITH CORONARY ANGIOGRAM;  Surgeon: Wellington Hampshire, MD;  Location: [facility];  Service: Cardiovascular;  Laterality: N/A;    Social History   Socioeconomic History  . Marital status: Widowed    Spouse name: Not on file  . Number of children: 4  . Years of education:  8  . Highest education level: 8th grade  Occupational History  . Occupation: Retired  Tobacco Use  . Smoking status: Never Smoker  . Smokeless tobacco: Never Used  Vaping Use  . Vaping Use: Never used  Substance and Sexual Activity  . Alcohol use: No  . Drug use: No  . Sexual activity: Never  Birth control/protection: Post-menopausal  Other Topics Concern  . Not on file  Social History Narrative  . Not on file   Social Determinants of Health   Financial Resource Strain: Not on file  Food Insecurity: Not on file  Transportation Needs: Not on file  Physical Activity: Not on file  Stress: Not on file  Social Connections: Not on file    Family History  Problem Relation Age of Onset  . Stroke Mother   . Other Mother        brain tumor  . Other Father        MI  . Heart attack Father   . Stroke Sister   . Macular degeneration Sister   . Diabetes Daughter   . Cancer Daughter        ovarian  . Colon polyps Daughter   . Atrial fibrillation Sister   . Hyperlipidemia Sister   . Osteoporosis Sister   . Stroke Sister   . Uterine cancer Sister   . Heart attack Brother   . Heart disease Brother   . Asthma Brother   . Breast cancer Neg Hx     Outpatient Encounter Medications as of 05/24/2020  Medication Sig  . acetaminophen (TYLENOL) 500 MG tablet Take 1 tablet (500 mg total) by mouth every 6 (six) hours as needed for mild pain, fever or headache.  Marland Kitchen apixaban (ELIQUIS) 2.5 MG TABS tablet Take 1 tablet (2.5 mg total) by mouth 2 (two) times daily.  . carboxymethylcellulose (REFRESH PLUS) 0.5 % SOLN Place 1 drop into both eyes 3 (three) times daily as needed (dry/irritated eyes.).  Marland Kitchen Cholecalciferol (VITAMIN D) 50 MCG (2000 UT) CAPS Take 2,000 Units by mouth daily.   Marland Kitchen docusate sodium (COLACE) 100 MG capsule Take 100 mg by mouth 2 (two) times daily.  Marland Kitchen doxycycline (VIBRA-TABS) 100 MG tablet Take 1 tablet (100 mg total) by mouth 2 (two) times daily.  . feeding supplement  (ENSURE ENLIVE / ENSURE PLUS) LIQD Take 237 mLs by mouth 2 (two) times daily between meals.  Marland Kitchen HYDROcodone-acetaminophen (NORCO/VICODIN) 5-325 MG tablet TAKE 1 TABLET EVERY 4 HOURS AS NEEDED FOR MODERATE PAIN  . isosorbide mononitrate (IMDUR) 30 MG 24 hr tablet TAKE ONE HALF (1/2) TABLET DAILY  . levothyroxine (SYNTHROID) 100 MCG tablet Take 1 tablet (100 mcg total) by mouth daily.  Marland Kitchen loratadine (CLARITIN) 10 MG tablet Take 1 tablet (10 mg total) by mouth daily as needed for allergies, rhinitis or itching. (for itching)  . MELATONIN PO Take by mouth at bedtime.  . metoprolol tartrate (LOPRESSOR) 25 MG tablet Take 0.5 tablets (12.5 mg total) by mouth 2 (two) times daily. HOLD FOR SBP<95 OR HR<65  . Multiple Vitamins-Minerals (CENTRUM SILVER ULTRA WOMENS PO) Take 1 tablet by mouth daily.  . nitroGLYCERIN (NITROSTAT) 0.4 MG SL tablet Place 1 tablet (0.4 mg total) under the tongue every 5 (five) minutes as needed for chest pain.  . pantoprazole (PROTONIX) 40 MG tablet Take 1 tablet (40 mg total) by mouth 2 (two) times daily before a meal.  . PARoxetine (PAXIL) 20 MG tablet TAKE (1) TABLET DAILY IN THE MORNING.  . predniSONE (STERAPRED UNI-PAK 21 TAB) 10 MG (21) TBPK tablet Use as directed  . rosuvastatin (CRESTOR) 10 MG tablet Take 1 tablet (10 mg total) by mouth daily.   No facility-administered encounter medications on file as of 05/24/2020.    ALLERGIES: Allergies  Allergen Reactions  . Sotalol Other (See Comments)    Torsades   .  Tramadol Other (See Comments)    Hallucination, bad-dream  . Actos [Pioglitazone] Swelling  . Adhesive [Tape] Other (See Comments)    Causes sores  . Atorvastatin Other (See Comments)    myalgia  . Bactrim [Sulfamethoxazole-Trimethoprim] Itching and Rash    Itchy rash  . Ciprofloxacin Nausea Only  . Contrast Media [Iodinated Diagnostic Agents] Other (See Comments)    Headache (no action/pre-med required)  . Latex Rash  . Pedi-Pre Tape Spray [Wound Dressing  Adhesive] Other (See Comments)    Causes sores  . Sulfa Antibiotics Other (See Comments)    Causes sores   VACCINATION STATUS: Immunization History  Administered Date(s) Administered  . Fluad Quad(high Dose 65+) 10/23/2018, 01/05/2020  . Influenza, High Dose Seasonal PF 11/01/2015, 11/17/2016, 11/13/2017  . Influenza,inj,Quad PF,6+ Mos 11/11/2013, 10/21/2014  . Influenza-Unspecified 11/16/2012, 10/23/2016  . Pneumococcal Conjugate-13 11/18/2012  . Pneumococcal Polysaccharide-23 01/17/2003  . Tdap 05/09/2010  . Zoster 05/01/2011     HPI    [name]  is a patient with the above medical history. she is on ongoing treatment for hypothyroidism with levothyroxine 100 mcg p.o. daily before breakfast.  Recently, she was hospitalized for abdominal pain.  Work-up revealed some mild hypocortisolism.  She is not diagnosed with adrenal insufficiency yet.   She was sent for ACTH stimulation test which returns normal adrenal response and ruling out adrenal insufficiency. She is not on steroids.  She does not remember adrenal surgery nor infections.  She is still reports poor appetite, however her weight is stabilizing after she lost 19 pounds last year.     She denies diarrhea, nausea, nor abdominal pain at this time.  Patient still complains of disequilibrium, and giddiness at times.  Pt denies feeling nodules in neck, hoarseness, dysphagia/odynophagia, SOB with lying down.  she denies family history of  thyroid disorders.  No family history of thyroid cancer.  No history of  radiation therapy to head or neck. -  No history of autoimmune thyroid dysfunction.     ROS: Limited as above.   Physical Exam: BP (!) 142/78   Pulse (!) 56   Ht '5\' 3"'$  (1.6 m)   Wt 120 lb 3.2 oz (54.5 kg)   BMI 21.29 kg/m  Wt Readings from Last 3 Encounters:  05/24/20 120 lb 3.2 oz (54.5 kg)  05/18/20 119 lb 9.6 oz (54.3 kg)  05/12/20 120 lb 12.8 oz (54.8 kg)       CMP ( most recent) CMP      Component Value Date/Time   NA 137 04/05/2020 0833   NA 137 03/23/2020 1046   K 3.9 04/05/2020 0833   CL 106 04/05/2020 0833   CO2 23 04/05/2020 0833   GLUCOSE 129 (H) 04/05/2020 0833   BUN 25 (H) 04/05/2020 0833   BUN 12 03/23/2020 1046   CREATININE 1.15 (H) 04/05/2020 0833   CREATININE 1.52 (H) 07/31/2012 1019   CALCIUM 9.6 04/05/2020 0833   PROT 6.0 (L) 03/29/2020 0551   PROT 6.4 03/23/2020 1046   ALBUMIN 2.8 (L) 03/29/2020 0551   ALBUMIN 3.4 (L) 03/23/2020 1046   AST 39 03/29/2020 0551   ALT 29 03/29/2020 0551   ALKPHOS 58 03/29/2020 0551   BILITOT 0.5 03/29/2020 0551   BILITOT 0.5 03/23/2020 1046   GFRNONAA 46 (L) 04/05/2020 0833   GFRNONAA 33 (L) 07/31/2012 1019   GFRAA 36 (L) 02/05/2020 1111   GFRAA 38 (L) 07/31/2012 1019     Diabetic Labs (most recent): Lab Results  Component Value Date   HGBA1C 6.6 08/15/2019   HGBA1C 7.0 (H) 10/23/2018   HGBA1C 7.1 (H) 07/12/2018     Lipid Panel ( most recent) Lipid Panel     Component Value Date/Time   CHOL 164 12/23/2019 2104   CHOL 139 10/23/2018 0947   CHOL 140 07/31/2012 1019   TRIG 61 12/23/2019 2104   TRIG 115 06/07/2015 1111   TRIG 161 (H) 07/31/2012 1019   HDL 66 12/23/2019 2104   HDL 53 10/23/2018 0947   HDL 68 06/07/2015 1111   HDL 43 07/31/2012 1019   CHOLHDL 2.5 12/23/2019 2104   VLDL 12 12/23/2019 2104   LDLCALC 86 12/23/2019 2104   LDLCALC 65 10/23/2018 0947   LDLCALC 65 09/09/2013 0841   LDLCALC 65 07/31/2012 1019   LABVLDL 21 10/23/2018 0947       Lab Results  Component Value Date   TSH 1.830 03/23/2020   TSH 6.180 (H) 02/05/2020   TSH 7.000 (H) 01/05/2020   TSH 6.950 (H) 09/24/2019   TSH 14.400 (H) 08/15/2019   TSH 56.294 (H) 07/25/2019   TSH 51.373 (H) 07/09/2019   TSH 5.095 (H) 03/11/2019   TSH 3.439 02/05/2018   TSH 3.300 12/31/2017   FREET4 1.50 09/24/2019   FREET4 0.66 07/26/2019   FREET4 1.20 12/07/2009    April 02, 2018 2 AM cortisol is 5.4 March 30, 2018 2 AM cortisol  5.3   ASSESSMENT:  1.  Hypocortisolism 2. Hypothyroidism-likely amiodarone induced PLAN:   -I discussed her lab results with her and her daughter.  The labs are consistent with appropriate adrenal response to ACTH stimulation test with no sign of adrenal insufficiency.  She will not need a steroid supplement or replacement.      -Her recent thyroid function test from March 23, 2020 was consistent with appropriate replacement.  She is advised to continue levothyroxine 100 mcg p.o. daily before breakfast.   - We discussed about the correct intake of her thyroid hormone, on empty stomach at fasting, with water, separated by at least 30 minutes from breakfast and other medications,  and separated by more than 4 hours from calcium, iron, multivitamins, acid reflux medications (PPIs). -Patient is made aware of the fact that thyroid hormone replacement is needed for life, dose to be adjusted by periodic monitoring of thyroid function tests.   -Due to absence of clinical goiter, no need for thyroid ultrasound. -I also have a long discussion about the need to stay on amiodarone based on the cardiology recommendation as necessary.   She is advised to maintain close follow-up with her PCP Dr. Lajuana Ripple.    I spent 25 minutes in the care of the patient today including review of labs from Thyroid Function, CMP, and other relevant labs ; imaging/biopsy records (current and previous including abstractions from other facilities); face-to-face time discussing  her lab results and symptoms, medications doses, her options of short and long term treatment based on the latest standards of care / guidelines;   and documenting the encounter.  Etheleen Nicks  participated in the discussions, expressed understanding, and voiced agreement with the above plans.  All questions were answered to her satisfaction. she is encouraged to contact clinic should she have any questions or concerns prior to her return  visit.    Return in about 6 months (around 11/24/2020) for F/U with Pre-visit Labs.  Glade Lloyd, MD [facility] [address] [place], [place] [address] Phone: [phone]  Fax: [phone]   05/24/2020, 3:54 PM  This note was partially dictated with voice recognition software. Similar sounding words can be transcribed inadequately or may not  be corrected upon review.

## 2020-05-24 NOTE — Progress Notes (Signed)
cc'ed

## 2020-06-01 ENCOUNTER — Other Ambulatory Visit: Payer: Medicare Other | Admitting: Nurse Practitioner

## 2020-06-01 ENCOUNTER — Other Ambulatory Visit: Payer: Self-pay

## 2020-06-01 VITALS — BP 118/56 | HR 61 | Resp 16 | Ht 63.0 in | Wt 118.0 lb

## 2020-06-01 DIAGNOSIS — R531 Weakness: Secondary | ICD-10-CM

## 2020-06-01 DIAGNOSIS — R634 Abnormal weight loss: Secondary | ICD-10-CM | POA: Diagnosis not present

## 2020-06-01 DIAGNOSIS — Z515 Encounter for palliative care: Secondary | ICD-10-CM | POA: Diagnosis not present

## 2020-06-01 NOTE — Progress Notes (Signed)
[facility] Palliative Care Consult Note Telephone: [phone]  Fax: [phone]    Date of encounter: 06/01/20 PATIENT NAME: [name]   [phone] (home)  DOB: 12-Mar-1931 MRN: [id number]  PRIMARY CARE PROVIDER:    [name], DO,  [address] [place] [address] [phone]  REFERRING PROVIDER:   [name], DO [place],  [place] [address] [phone]  RESPONSIBLE PARTY:    Contact Information    Name Relation Home Work [name] and [name] [phone]  [phone]   [name], [name] and [name]   [phone]   [facility] Daughter [phone]     [name],. [name] Daughter   [phone]   [name] Son   [name] Relative   [phone]     I met face to face with patient in home. Son [name] present during visit. Palliative Care was asked to follow this patient by consultation request of  Renee Norlander, DO to address advance care planning and complex medical decision making. This is the initial visit.                                   ASSESSMENT AND PLAN / RECOMMENDATIONS:   Advance Care Planning/Goals of Care: Goals include to maximize quality of life and symptom management. Our advance care planning conversation included a discussion about:     The value and importance of advance care planning   Experiences with loved ones who have been seriously ill or have died   Exploration of personal, cultural or spiritual beliefs that might influence medical decisions   Exploration of goals of care in the event of a sudden injury or illness   Review and updating or creation of an  advance directive document .  CODE STATUS: DNR Goal of care: Patient's goal of care is comfort while maintaining function. Directives: Patient has a signed DNR form in home, copy on [facility] EMR. Patient reiterated desire to not be  resuscitated in the event of cardiac or respiratory arrest. The need to complete a MOST form was discussed, patient and her son expressed interest in completing a MOST form. Discussed and reviewed sections of the form in detail, opportunity for questions given, all questions answered. Patient's son asked that they be given time to review the form with patient's other children. A blank form left with patient and son, we will complete at next visit or when patient and family are ready. Palliative care will continue to provide support to patient, family and the medical team.  I spent 48 minutes providing this consultation. More than 50% of the time in this consultation was spent in counseling and care coordination. ------------------------------------------------------------------------------  Symptom Management/Plan: Poor oral intake: Patient report taking nutritional supplement Ensure 1 to 2 cans a day, report also taking Activia Yogurt and occassionally can soups. Discussed need to increase calorie intake to promote weight gain, weight 118lbs down from baseline weight of 190Lbs, BMI 20.1. Encouraged snacking between meals, encouraged eating small meals at intervals. Encouraged making calorie dense fruit smoothies with frozen fruits and proteins like peanut butter, to prevent tiring from chewing. Patient verbalized interest in trying calorie dense fruit smoothies. Patient's son agreed to assist patient in making the smoothes.  Generalized weakness: Condition is ongoing, likely related to poor oral intake. Patient lives alone,  family takes turns to assist her with some of her ADLs and assist with setting up her medications. She ambulates with a Rolator walker, no report of recent falls. Family exploring CAP services to assist with provision of personal caregivers. Continue supportive care. Continue PT. Maintain safety, prevent falls. Provided general support and encouragement, no other unmet needs identified  at this time.  Follow up Palliative Care Visit: Palliative care will continue to follow for complex medical decision making, advance care planning, and clarification of goals. Return in about 4 weeks or prn.  PPS: 60%  HOSPICE ELIGIBILITY/DIAGNOSIS: TBD  Chief Complaint: Poor oral intake  History obtained from review of Epic EMR, and discussion with [name] and her son [name].   HISTORY OF PRESENT ILLNESS:  [name] is a 87 y.o. 87 year old female  with A.fib on Eliqus, CAD s/p AICD, HTN, HLD, CKD 3, IBS, nephrolithiasis, arthritis, GERD with esophagitis, history of COVID 19 in 2020. Patient with report of poor oral intake, condition is ongoing and worsening, associated with poor appetiet, resulting in weight loss and generalized weakness. Symptoms not aggravated or relieved by anything. Patient denied dysphagia, nausea or vomiting, report occasional feeling of heartburn/indigestion after a large meal. She just completed antibiotics therapy with Doxycycline for respiratory infection, she denied fever, denied chills, denied SOB, report congested cough productive of clear sputum.  BMET 04/05/2020 Na 137, K 3.9, BUN 25, Cr 1.15 GFR 46 Hgb/Hct: 10.1/31.2 on 04/04/2020 Reviewed abdominal ultrasound, CT abdomen completed on 03/28/2020.  I reviewed available labs, medications, imaging, studies and related documents from the EMR.  Records reviewed and summarized above.   ROS EYES: denies acute vision changes ENMT: denies dysphagia Cardiovascular: denies chest pain, denies DOE Pulmonary: denies cough, denies increased SOB Abdomen: endorses poor appetite, denies constipation, endorses continence of bowel GU: denies dysuria, endorses continence of urine MSK: endorsed weakness, no falls reported Skin: denies rashes or wounds Neurological: denies pain, denies insomnia Psych: Endorses positive mood Heme/lymph/immuno: denies bruises, abnormal bleeding  Physical Exam: Current and past  weights: 118lbs down from baseline weight of 190lbs General: frail appearing, thin, sitting at her dinning in NAD EYES: anicteric sclera, no discharge  ENMT: hard of hearing, oral mucous membranes moist CV: S1S2 normal, no LE edema Pulmonary: LCTA, no increased work of breathing, no cough, room air Abdomen: soft and non tender, no ascites GU: deferred MSK: mild sarcopenia, moves all extremities, ambulatory Skin: warm and dry, no rashes or wounds on visible skin Neuro: generalized weakness, no cognitive impairment Psych: non-anxious affect, A and O x 3 Hem/lymph/immuno: no widespread bruising  CURRENT PROBLEM LIST:  PAST MEDICAL HISTORY:  Active Ambulatory Problems    Diagnosis Date Noted  . Hyperlipidemia associated with type 2 diabetes mellitus ([place]) 12/02/2007  . Chronic ischemic heart disease 12/03/2007  . EXTERNAL HEMORRHOIDS 12/02/2007  . ESOPHAGITIS 12/02/2007  . GASTRITIS 12/02/2007  . DIVERTICULOSIS, COLON 12/02/2007  . ARTHRITIS 12/02/2007  . Irritable bowel syndrome with diarrhea 12/03/2007  . EPIGASTRIC PAIN 12/03/2007  . DYSPHAGIA 03/24/2010  . Chest pain 07/25/2011  . Weakness 07/25/2011  . Chronic sinus bradycardia 07/25/2011  . ICD (implantable cardioverter-defibrillator) in place 07/25/2011  . CKD (chronic kidney disease), stage III ([place]) 07/25/2011  . History of DVT in the past, on eliquis now 07/25/2011  . Nephrolithiasis, just saw Dr [name] 07/25/2011  . Orthostatic hypotension 07/27/2011  . Metabolic syndrome 42/68/3419  . Ureteral stone with hydronephrosis 05/20/2013  . Paroxysmal atrial fibrillation, chads2 Vasc2 score of  5, on eliquis 10/16/2013  . Catecholaminergic polymorphic ventricular tachycardia ([place]) 10/16/2013  . Abnormal nuclear stress test, 12/30/13 12/31/2013  . Coronary artery disease involving native coronary artery of native heart without angina pectoris 12/31/2013  . ICD (implantable cardioverter-defibrillator) battery depletion  01/20/2014  . Diabetes mellitus type 2, diet-controlled ([place]) 09/14/2014  . Near syncope 05/22/2015  . Long term current use of anticoagulant 02/17/2016  . Iron deficiency anemia 10/30/2016  . History of drug-induced prolonged QT interval with torsade de pointes 11/01/2016  . Essential hypertension 12/21/2017  . Acute kidney injury superimposed on CKD ([place]) 02/05/2018  . Dehydration 02/06/2018  . Hypokalemia 02/06/2018  . Diarrhea of presumed infectious origin 02/06/2018  . GERD (gastroesophageal reflux disease)   . Osteoarthritis of right knee 03/29/2018  . Degenerative scoliosis 02/19/2017  . Burst fracture of lumbar vertebra ([place]) 02/19/2017  . Chronic diarrhea 09/18/2018  . Abdominal pain 09/18/2018  . COVID-19 virus infection 12/28/2018  . Proctitis 01/09/2019  . Atrial fibrillation, chronic ([place]) 01/09/2019  . Hyperlipidemia 01/09/2019  . Nausea 01/09/2019  . Hyperglycemia 01/09/2019  . Persistent atrial fibrillation ([place])   . Secondary hypercoagulable state ([place]) 03/11/2019  . Exudative age-related macular degeneration of right eye with active choroidal neovascularization ([place]) 04/30/2019  . Retinal hemorrhage of right eye 04/30/2019  . Advanced nonexudative age-related macular degeneration of left eye with subfoveal involvement 04/30/2019  . Exudative age-related macular degeneration of left eye with inactive choroidal neovascularization ([place]) 04/30/2019  . Advanced nonexudative age-related macular degeneration of right eye without subfoveal involvement 04/30/2019  . Posterior vitreous detachment of both eyes 04/30/2019  . TSH elevation 08/01/2019  . Hospital discharge follow-up 08/01/2019  . CAD (coronary artery disease)   . Hypothyroidism   . Hyponatremia   . Volume depletion   . Vertigo 11/15/2019  . Advanced care planning/counseling discussion 11/15/2019  . Abnormal CT of the abdomen   . RUQ pain   . Lesion of pancreas 11/26/2019  . Fibromuscular dysplasia of  right renal artery ([place]) 11/26/2019  . Left renal artery stenosis ([place]) 11/26/2019  . Constipation 11/27/2019  . Diarrhea 11/27/2019  . Prolonged QT interval 12/23/2019  . Acute lower UTI 12/23/2019  . Malnutrition of moderate degree 12/26/2019  . Intractable abdominal pain 03/28/2020  . Lactic acidosis 03/28/2020  . Elevated troponin I level 03/28/2020  . 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 Loss of weight 05/18/2020   Resolved Ambulatory Problems    Diagnosis Date Noted  . HEART ATTACK 12/02/2007  . COLITIS 12/02/2007  . RECTAL BLEEDING 12/03/2007  . Hypotension, meds stopped this admission 07/29/2011  . Unstable angina, neg MI, cath stable.maybe GI 12/30/2013  . Diet-controlled diabetes mellitus ([place]) 12/21/2017   Past Medical History:  Diagnosis Date  . AICD (automatic cardioverter/defibrillator) present   . Allergy   . Anxiety   . Arthritis   . Atrial fibrillation ([place])   . CAD in native artery 12/31/2013  . Cataract   . Chronic back pain   . Clotting disorder ([place])   . Diabetes mellitus without complication ([place])   . Esophageal stricture   . H/O hiatal hernia   . Hiatal hernia   . HYPERCHOLESTEROLEMIA   . Hypertension   . ICD (implantable cardiac defibrillator) in place   . Macular degeneration   . Myocardial infarction ([place]) 1993  . Peripheral vascular disease ([place])    SOCIAL HX:  Social History   Tobacco Use  . Smoking status: Never Smoker  . Smokeless tobacco: Never Used  Substance  Use Topics  . Alcohol use: No   FAMILY HX:  Family History  Problem Relation Age of Onset  . Stroke Mother   . Other Mother        brain tumor  . Other Father        MI  . Heart attack Father   . Stroke Sister   . Macular degeneration Sister   . Diabetes Daughter   . Cancer Daughter        ovarian  . Colon polyps Daughter   . Atrial fibrillation Sister   . Hyperlipidemia Sister   . Osteoporosis Sister   . Stroke Sister   . Uterine cancer Sister   . Heart  attack Brother   . Heart disease Brother   . Asthma Brother   . Breast cancer Neg Hx      ALLERGIES:  Allergies  Allergen Reactions  . Sotalol Other (See Comments)    Torsades   . Tramadol Other (See Comments)    Hallucination, bad-dream  . Actos [Pioglitazone] Swelling  . Adhesive [Tape] Other (See Comments)    Causes sores  . Atorvastatin Other (See Comments)    myalgia  . Bactrim [Sulfamethoxazole-Trimethoprim] Itching and Rash    Itchy rash  . Ciprofloxacin Nausea Only  . Contrast Media [Iodinated Diagnostic Agents] Other (See Comments)    Headache (no action/pre-med required)  . Latex Rash  . Pedi-Pre Tape Spray [Wound Dressing Adhesive] Other (See Comments)    Causes sores  . Sulfa Antibiotics Other (See Comments)    Causes sores     PERTINENT MEDICATIONS:  Outpatient Encounter Medications as of 06/01/2020  Medication Sig  . acetaminophen (TYLENOL) 500 MG tablet Take 1 tablet (500 mg total) by mouth every 6 (six) hours as needed for mild pain, fever or headache.  Marland Kitchen apixaban (ELIQUIS) 2.5 MG TABS tablet Take 1 tablet (2.5 mg total) by mouth 2 (two) times daily.  . carboxymethylcellulose (REFRESH PLUS) 0.5 % SOLN Place 1 drop into both eyes 3 (three) times daily as needed (dry/irritated eyes.).  Marland Kitchen Cholecalciferol (VITAMIN D) 50 MCG (2000 UT) CAPS Take 2,000 Units by mouth daily.   Marland Kitchen docusate sodium (COLACE) 100 MG capsule Take 100 mg by mouth 2 (two) times daily.  Marland Kitchen doxycycline (VIBRA-TABS) 100 MG tablet Take 1 tablet (100 mg total) by mouth 2 (two) times daily.  . feeding supplement (ENSURE ENLIVE / ENSURE PLUS) LIQD Take 237 mLs by mouth 2 (two) times daily between meals.  Marland Kitchen HYDROcodone-acetaminophen (NORCO/VICODIN) 5-325 MG tablet TAKE 1 TABLET EVERY 4 HOURS AS NEEDED FOR MODERATE PAIN  . isosorbide mononitrate (IMDUR) 30 MG 24 hr tablet TAKE ONE HALF (1/2) TABLET DAILY  . levothyroxine (SYNTHROID) 100 MCG tablet Take 1 tablet (100 mcg total) by mouth daily.  Marland Kitchen  loratadine (CLARITIN) 10 MG tablet Take 1 tablet (10 mg total) by mouth daily as needed for allergies, rhinitis or itching. (for itching)  . MELATONIN PO Take by mouth at bedtime.  . metoprolol tartrate (LOPRESSOR) 25 MG tablet Take 0.5 tablets (12.5 mg total) by mouth 2 (two) times daily. HOLD FOR SBP<95 OR HR<65  . Multiple Vitamins-Minerals (CENTRUM SILVER ULTRA WOMENS PO) Take 1 tablet by mouth daily.  . nitroGLYCERIN (NITROSTAT) 0.4 MG SL tablet Place 1 tablet (0.4 mg total) under the tongue every 5 (five) minutes as needed for chest pain.  . pantoprazole (PROTONIX) 40 MG tablet Take 1 tablet (40 mg total) by mouth 2 (two) times daily before a meal.  .  PARoxetine (PAXIL) 20 MG tablet TAKE (1) TABLET DAILY IN THE MORNING.  . predniSONE (STERAPRED UNI-PAK 21 TAB) 10 MG (21) TBPK tablet Use as directed  . rosuvastatin (CRESTOR) 10 MG tablet Take 1 tablet (10 mg total) by mouth daily.   No facility-administered encounter medications on file as of 06/01/2020.   Thank you for the opportunity to participate in the care of [name].  The palliative care team will continue to follow. Please call our office at [phone] if we can be of additional assistance.   Jari Favre, DNP, AGPCNP-BC  COVID-19 PATIENT SCREENING TOOL Asked and negative response unless otherwise noted:   Have you had symptoms of covid, tested positive or been in contact with someone with symptoms/positive test in the past 5-10 days?

## 2020-06-02 DIAGNOSIS — I440 Atrioventricular block, first degree: Secondary | ICD-10-CM | POA: Diagnosis not present

## 2020-06-02 DIAGNOSIS — I499 Cardiac arrhythmia, unspecified: Secondary | ICD-10-CM | POA: Diagnosis not present

## 2020-06-02 DIAGNOSIS — R0689 Other abnormalities of breathing: Secondary | ICD-10-CM | POA: Diagnosis not present

## 2020-06-02 DIAGNOSIS — I10 Essential (primary) hypertension: Secondary | ICD-10-CM | POA: Diagnosis not present

## 2020-06-02 DIAGNOSIS — R001 Bradycardia, unspecified: Secondary | ICD-10-CM | POA: Diagnosis not present

## 2020-06-03 ENCOUNTER — Other Ambulatory Visit: Payer: Self-pay

## 2020-06-03 ENCOUNTER — Emergency Department (HOSPITAL_COMMUNITY): Payer: Medicare Other

## 2020-06-03 ENCOUNTER — Encounter (HOSPITAL_COMMUNITY): Payer: Self-pay

## 2020-06-03 ENCOUNTER — Telehealth: Payer: Self-pay | Admitting: Family Medicine

## 2020-06-03 ENCOUNTER — Emergency Department (HOSPITAL_COMMUNITY)
Admission: EM | Admit: 2020-06-03 | Discharge: 2020-06-03 | Disposition: A | Discharge status: Home / Self Care | Payer: Medicare Other | Attending: Emergency Medicine | Admitting: Emergency Medicine

## 2020-06-03 DIAGNOSIS — I480 Paroxysmal atrial fibrillation: Secondary | ICD-10-CM | POA: Insufficient documentation

## 2020-06-03 DIAGNOSIS — S22080A Wedge compression fracture of T11-T12 vertebra, initial encounter for closed fracture: Secondary | ICD-10-CM | POA: Diagnosis not present

## 2020-06-03 DIAGNOSIS — I160 Hypertensive urgency: Secondary | ICD-10-CM | POA: Insufficient documentation

## 2020-06-03 DIAGNOSIS — E1122 Type 2 diabetes mellitus with diabetic chronic kidney disease: Secondary | ICD-10-CM | POA: Diagnosis not present

## 2020-06-03 DIAGNOSIS — Z79899 Other long term (current) drug therapy: Secondary | ICD-10-CM | POA: Diagnosis not present

## 2020-06-03 DIAGNOSIS — R1084 Generalized abdominal pain: Secondary | ICD-10-CM | POA: Diagnosis not present

## 2020-06-03 DIAGNOSIS — Z91040 Latex allergy status: Secondary | ICD-10-CM | POA: Diagnosis not present

## 2020-06-03 DIAGNOSIS — N183 Chronic kidney disease, stage 3 unspecified: Secondary | ICD-10-CM | POA: Diagnosis not present

## 2020-06-03 DIAGNOSIS — Z8616 Personal history of COVID-19: Secondary | ICD-10-CM | POA: Diagnosis not present

## 2020-06-03 DIAGNOSIS — R0789 Other chest pain: Secondary | ICD-10-CM

## 2020-06-03 DIAGNOSIS — I129 Hypertensive chronic kidney disease with stage 1 through stage 4 chronic kidney disease, or unspecified chronic kidney disease: Secondary | ICD-10-CM | POA: Insufficient documentation

## 2020-06-03 DIAGNOSIS — R531 Weakness: Secondary | ICD-10-CM | POA: Insufficient documentation

## 2020-06-03 DIAGNOSIS — E1151 Type 2 diabetes mellitus with diabetic peripheral angiopathy without gangrene: Secondary | ICD-10-CM | POA: Insufficient documentation

## 2020-06-03 DIAGNOSIS — N261 Atrophy of kidney (terminal): Secondary | ICD-10-CM | POA: Diagnosis not present

## 2020-06-03 DIAGNOSIS — Z95810 Presence of automatic (implantable) cardiac defibrillator: Secondary | ICD-10-CM | POA: Insufficient documentation

## 2020-06-03 DIAGNOSIS — K219 Gastro-esophageal reflux disease without esophagitis: Secondary | ICD-10-CM | POA: Insufficient documentation

## 2020-06-03 DIAGNOSIS — Z86718 Personal history of other venous thrombosis and embolism: Secondary | ICD-10-CM | POA: Insufficient documentation

## 2020-06-03 DIAGNOSIS — E785 Hyperlipidemia, unspecified: Secondary | ICD-10-CM | POA: Diagnosis not present

## 2020-06-03 DIAGNOSIS — R5381 Other malaise: Secondary | ICD-10-CM | POA: Diagnosis not present

## 2020-06-03 DIAGNOSIS — Z7901 Long term (current) use of anticoagulants: Secondary | ICD-10-CM | POA: Diagnosis not present

## 2020-06-03 DIAGNOSIS — I25110 Atherosclerotic heart disease of native coronary artery with unstable angina pectoris: Secondary | ICD-10-CM | POA: Insufficient documentation

## 2020-06-03 DIAGNOSIS — N3289 Other specified disorders of bladder: Secondary | ICD-10-CM | POA: Diagnosis not present

## 2020-06-03 DIAGNOSIS — E1169 Type 2 diabetes mellitus with other specified complication: Secondary | ICD-10-CM | POA: Diagnosis not present

## 2020-06-03 DIAGNOSIS — E039 Hypothyroidism, unspecified: Secondary | ICD-10-CM | POA: Insufficient documentation

## 2020-06-03 LAB — COMPREHENSIVE METABOLIC PANEL
ALT: 20 U/L (ref 0–44)
AST: 20 U/L (ref 15–41)
Albumin: 3 g/dL — ABNORMAL LOW (ref 3.5–5.0)
Alkaline Phosphatase: 70 U/L (ref 38–126)
Anion gap: 8 (ref 5–15)
BUN: 30 mg/dL — ABNORMAL HIGH (ref 8–23)
CO2: 27 mmol/L (ref 22–32)
Calcium: 8.9 mg/dL (ref 8.9–10.3)
Chloride: 101 mmol/L (ref 98–111)
Creatinine, Ser: 1.15 mg/dL — ABNORMAL HIGH (ref 0.44–1.00)
GFR, Estimated: 46 mL/min — ABNORMAL LOW (ref >60)
Glucose, Bld: 141 mg/dL — ABNORMAL HIGH (ref 70–99)
Potassium: 4.2 mmol/L (ref 3.5–5.1)
Sodium: 136 mmol/L (ref 135–145)
Total Bilirubin: 0.7 mg/dL (ref 0.3–1.2)
Total Protein: 6.5 g/dL (ref 6.5–8.1)

## 2020-06-03 LAB — CBC WITH DIFFERENTIAL/PLATELET
Abs Immature Granulocytes: 0.05 10*3/uL (ref 0.00–0.07)
Basophils Absolute: 0 10*3/uL (ref 0.0–0.1)
Basophils Relative: 0 %
Eosinophils Absolute: 0.2 10*3/uL (ref 0.0–0.5)
Eosinophils Relative: 4 %
HCT: 38 % (ref 36.0–46.0)
Hemoglobin: 12.4 g/dL (ref 12.0–15.0)
Immature Granulocytes: 1 %
Lymphocytes Relative: 27 %
Lymphs Abs: 1.6 10*3/uL (ref 0.7–4.0)
MCH: 30.5 pg (ref 26.0–34.0)
MCHC: 32.6 g/dL (ref 30.0–36.0)
MCV: 93.6 fL (ref 80.0–100.0)
Monocytes Absolute: 0.8 10*3/uL (ref 0.1–1.0)
Monocytes Relative: 13 %
Neutro Abs: 3.4 10*3/uL (ref 1.7–7.7)
Neutrophils Relative %: 55 %
Platelets: 258 10*3/uL (ref 150–400)
RBC: 4.06 MIL/uL (ref 3.87–5.11)
RDW: 15.1 % (ref 11.5–15.5)
WBC: 6.1 10*3/uL (ref 4.0–10.5)
nRBC: 0 % (ref 0.0–0.2)

## 2020-06-03 LAB — URINALYSIS, ROUTINE W REFLEX MICROSCOPIC
Bacteria, UA: NONE SEEN
Bilirubin Urine: NEGATIVE
Glucose, UA: 50 mg/dL — AB
Ketones, ur: NEGATIVE mg/dL
Leukocytes,Ua: NEGATIVE
Nitrite: NEGATIVE
Protein, ur: NEGATIVE mg/dL
Specific Gravity, Urine: 1.003 — ABNORMAL LOW (ref 1.005–1.030)
pH: 7 (ref 5.0–8.0)

## 2020-06-03 LAB — LIPASE, BLOOD: Lipase: 50 U/L (ref 11–51)

## 2020-06-03 LAB — TROPONIN I (HIGH SENSITIVITY)
Troponin I (High Sensitivity): 13 ng/L (ref <18)
Troponin I (High Sensitivity): 12 ng/L (ref <18)

## 2020-06-03 MED ORDER — HYDRALAZINE HCL 20 MG/ML IJ SOLN
5.0000 mg | Freq: Once | INTRAMUSCULAR | Status: AC
  Administered 2020-06-03: 5 mg via INTRAVENOUS
  Filled 2020-06-03: qty 1

## 2020-06-03 MED ORDER — ISOSORBIDE MONONITRATE ER 30 MG PO TB24
30.0000 mg | ORAL_TABLET | Freq: Every day | ORAL | Status: DC
  Administered 2020-06-03: 30 mg via ORAL
  Filled 2020-06-03: qty 1

## 2020-06-03 MED ORDER — HYDROCODONE-ACETAMINOPHEN 5-325 MG PO TABS
1.0000 | ORAL_TABLET | Freq: Once | ORAL | Status: AC
Start: 2020-06-03 — End: 2020-06-03
  Administered 2020-06-03: 1 via ORAL
  Filled 2020-06-03: qty 1

## 2020-06-03 MED ORDER — METOPROLOL TARTRATE 25 MG PO TABS
12.5000 mg | ORAL_TABLET | Freq: Once | ORAL | Status: AC
  Administered 2020-06-03: 12.5 mg via ORAL
  Filled 2020-06-03: qty 1

## 2020-06-03 MED ORDER — FENTANYL CITRATE (PF) 100 MCG/2ML IJ SOLN
50.0000 ug | Freq: Once | INTRAMUSCULAR | Status: AC
  Administered 2020-06-03: 50 ug via INTRAVENOUS
  Filled 2020-06-03: qty 2

## 2020-06-03 NOTE — ED Notes (Signed)
Patient transported to CT 

## 2020-06-03 NOTE — Discharge Instructions (Addendum)
Follow-up with your doctors.  Return for any new or worse symptoms.

## 2020-06-03 NOTE — Telephone Encounter (Signed)
Spoke with daughter in [facility].  Dr. Lajuana Ripple does not have any available appointments, so patient was scheduled tomorrow at 12:00 pm with Je.

## 2020-06-03 NOTE — ED Provider Notes (Addendum)
[facility] EMERGENCY DEPARTMENT Provider Note   CSN: [id number] Arrival date & time: 06/03/20  0016     History Chief Complaint  Patient presents with  . Weakness    [name] is a 87 y.o. female.  HPI     This is an 87 year old female with a history of atrial fibrillation, coronary artery disease, DVT and multiple other medical problems who presents with generally not feeling well.  Patient reports over the last 24 hours she has just not felt well.  She states she has felt generally weak.  She laid down in bed tonight and states that she began to have some chest pressure.  She states "my whole body felt like it was going to give out."  She not had any recent fevers or illnesses to her knowledge.  She has had some recent issues with abdominal pain for which she reports having a mostly negative work-up.  Per chart review, running diagnosis is intestinal angina but this pain has mostly been postprandial.  She denies any urinary symptoms, nausea, vomiting, diarrhea.  Describing her chest pain, she reports as pressure and nonradiating.  No associated shortness of breath.  Rates her discomfort at 7 out of 10.  Patient is DNR  Past Medical History:  Diagnosis Date  . AICD (automatic cardioverter/defibrillator) present   . Allergy    SESONAL  . Anxiety   . ARTHRITIS   . Arthritis   . Atrial fibrillation ([place])   . CAD (coronary artery disease)    a. history of cardiac arrest 1993;  b. s/p LAD/LCX stenting in 2003;  c. 07/2011 Cath: LM nl, LAD patent stent, D1 80ost, LCX patent stent, RCA min irregs;  d. 04/2012 MV: EF 66%, no ishcemia;  e. 12/2013 Echo: Ef 55%, no rwma, Gr 1 DD, triv AI/MR, mildly dil LA;  f. 12/2013 Lexi MV: intermediate risk - apical ischemia and inf/infsept fixed defect, ? artifactual.  . CAD in native artery 12/31/2013   Previous stents to LAD and Ramus patent on cath today 12/31/13 also There is severe disease in the ostial first diagonal which is unchanged from  most recent cardiac catheterization. The right coronary artery could not be engaged selectively but nonselective angiography showed no significant disease in the proximal and midsegment.     . Cataract    DENIES  . Chronic back pain   . Chronic sinus bradycardia   . CKD (chronic kidney disease), stage III ([place])   . Clotting disorder (HCC)    DVT  . COLITIS 12/02/2007   Qualifier: Diagnosis of  By: Nils Pyle CMA ([place]), [name]    . Diabetes mellitus without complication ([place])    DENIES  . Diverticulosis of colon (without mention of hemorrhage) 2007   Colonoscopy   . Esophageal stricture    a. 2012 s/p dil.  . Esophagitis, unspecified    a. 2012 EGD  . EXTERNAL HEMORRHOIDS   . GERD (gastroesophageal reflux disease)    omeprazole  . H/O hiatal hernia   . Hiatal hernia    a. 2012 EGD.  Marland Kitchen History of DVT in the past, not on Coumadin now    left  leg  . HYPERCHOLESTEROLEMIA   . Hypertension   . ICD (implantable cardiac defibrillator) in place    a. s/p initial ICD in 1993 in setting of cardiac arrest;  b. 01/2007 gen change: Guidant T135 Vitality DS VR single lead ICD.  . Macular degeneration    gets injecton in eye every  5 weeks- last injection - 05/03/2013   . Myocardial infarction ([place]) 1993  . Near syncope 05/22/2015  . Nephrolithiasis, just saw Dr [name]   . Orthostatic hypotension   . Peripheral vascular disease ([place])    ???  . RECTAL BLEEDING 12/03/2007   Qualifier: Diagnosis of  By: Sharlett Iles MD Byrd Hesselbach   . Unspecified gastritis and gastroduodenitis without mention of hemorrhage    a. 2003 EGD->not noted on 2012 EGD.  Marland Kitchen Unstable angina, neg MI, cath stable.maybe GI 12/30/2013    Patient Active Problem List   Diagnosis Date Noted  . Loss of weight 05/18/2020  . 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 Intractable abdominal pain 03/28/2020  . Lactic acidosis 03/28/2020  . Elevated troponin I level 03/28/2020  . Malnutrition of moderate degree 12/26/2019  .  Prolonged QT interval 12/23/2019  . Acute lower UTI 12/23/2019  . Constipation 11/27/2019  . Diarrhea 11/27/2019  . Lesion of pancreas 11/26/2019  . Fibromuscular dysplasia of right renal artery ([place]) 11/26/2019  . Left renal artery stenosis ([place]) 11/26/2019  . RUQ pain   . Abnormal CT of the abdomen   . Vertigo 11/15/2019  . Advanced care planning/counseling discussion 11/15/2019  . CAD (coronary artery disease)   . Hypothyroidism   . Hyponatremia   . Volume depletion   . TSH elevation 08/01/2019  . Hospital discharge follow-up 08/01/2019  . Exudative age-related macular degeneration of right eye with active choroidal neovascularization ([place]) 04/30/2019  . Retinal hemorrhage of right eye 04/30/2019  . Advanced nonexudative age-related macular degeneration of left eye with subfoveal involvement 04/30/2019  . Exudative age-related macular degeneration of left eye with inactive choroidal neovascularization ([place]) 04/30/2019  . Advanced nonexudative age-related macular degeneration of right eye without subfoveal involvement 04/30/2019  . Posterior vitreous detachment of both eyes 04/30/2019  . Secondary hypercoagulable state ([place]) 03/11/2019  . Persistent atrial fibrillation ([place])   . Proctitis 01/09/2019  . Atrial fibrillation, chronic ([place]) 01/09/2019  . Hyperlipidemia 01/09/2019  . Nausea 01/09/2019  . Hyperglycemia 01/09/2019  . COVID-19 virus infection 12/28/2018  . Chronic diarrhea 09/18/2018  . Abdominal pain 09/18/2018  . Osteoarthritis of right knee 03/29/2018  . GERD (gastroesophageal reflux disease)   . Dehydration 02/06/2018  . Hypokalemia 02/06/2018  . Diarrhea of presumed infectious origin 02/06/2018  . Acute kidney injury superimposed on CKD ([place]) 02/05/2018  . Essential hypertension 12/21/2017  . Degenerative scoliosis 02/19/2017  . Burst fracture of lumbar vertebra ([place]) 02/19/2017  . History of drug-induced prolonged QT interval with torsade de pointes  11/01/2016  . Iron deficiency anemia 10/30/2016  . Long term current use of anticoagulant 02/17/2016  . Near syncope 05/22/2015  . Diabetes mellitus type 2, diet-controlled ([place]) 09/14/2014  . ICD (implantable cardioverter-defibrillator) battery depletion 01/20/2014  . Abnormal nuclear stress test, 12/30/13 12/31/2013  . Coronary artery disease involving native coronary artery of native heart without angina pectoris 12/31/2013  . Paroxysmal atrial fibrillation, chads2 Vasc2 score of 5, on eliquis 10/16/2013  . Catecholaminergic polymorphic ventricular tachycardia ([place]) 10/16/2013  . Ureteral stone with hydronephrosis 05/20/2013  . Metabolic syndrome [id number]  . Orthostatic hypotension 07/27/2011  . Chest pain 07/25/2011  . Weakness 07/25/2011    Class: Acute  . Chronic sinus bradycardia 07/25/2011  . ICD (implantable cardioverter-defibrillator) in place 07/25/2011  . CKD (chronic kidney disease), stage III ([place]) 07/25/2011    Class: Chronic  . History of DVT in the past, on eliquis now 07/25/2011    Class: History of  .  Nephrolithiasis, just saw Dr [name] 07/25/2011    Class: History of  . DYSPHAGIA 03/24/2010  . Chronic ischemic heart disease 12/03/2007  . Irritable bowel syndrome with diarrhea 12/03/2007  . EPIGASTRIC PAIN 12/03/2007  . Hyperlipidemia associated with type 2 diabetes mellitus ([place]) 12/02/2007    Class: History of  . EXTERNAL HEMORRHOIDS 12/02/2007    Class: History of  . ESOPHAGITIS 12/02/2007  . GASTRITIS 12/02/2007  . DIVERTICULOSIS, COLON 12/02/2007  . ARTHRITIS 12/02/2007    Class: Chronic    Past Surgical History:  Procedure Laterality Date  . BREAST BIOPSY Left 11/2013  . CARDIAC CATHETERIZATION    . CARDIAC CATHETERIZATION  01/01/15   patent stents -there is severe disease in ostial 1st diag but without change.   Marland Kitchen CARDIAC DEFIBRILLATOR PLACEMENT  02/05/2007   Guidant  . CARDIOVASCULAR STRESS TEST  12/30/2013   abnormal  . CARDIOVERSION  N/A 02/13/2019   Procedure: CARDIOVERSION;  Surgeon: Sanda Klein, MD;  Location: [facility];  Service: Cardiovascular;  Laterality: N/A;  . CATARACT EXTRACTION Right    Dr. Katy Fitch  . COLONOSCOPY    . COLONOSCOPY WITH PROPOFOL N/A 03/30/2020   Procedure: COLONOSCOPY WITH PROPOFOL;  Surgeon: Harvel Quale, MD;  [facility];  Service: Gastroenterology;  Laterality: N/A;  . coronary stents     . CYSTOSCOPY WITH URETEROSCOPY Right 05/20/2013   Procedure: CYSTOSCOPY, RIGHT URETEROSCOPY STONE EXTRACTION, Insertion of right DOUBLE J STENT ;  Surgeon: Irine Seal, MD;  Location: [facility];  Service: Urology;  Laterality: Right;  . CYSTOSCOPY WITH URETEROSCOPY AND STENT PLACEMENT N/A 06/03/2013   Procedure: SECOND LOOK CYSTOSCOPY WITH URETEROSCOPY  HOLMIUM LASER LITHO AND STONE EXTRACTION Sammie Bench ;  Surgeon: Malka So, MD;  Location: [facility];  Service: Urology;  Laterality: N/A;  . ESOPHAGOGASTRODUODENOSCOPY (EGD) WITH PROPOFOL N/A 11/20/2019   Procedure: ESOPHAGOGASTRODUODENOSCOPY (EGD) WITH PROPOFOL;  Surgeon: Daneil Dolin, MD;  [facility];  Service: Endoscopy;  Laterality: N/A;  . GIVENS CAPSULE STUDY N/A 10/30/2016   Procedure: GIVENS CAPSULE STUDY;  Surgeon: Irene Shipper, MD;  Location: [place];  Service: Endoscopy;  Laterality: N/A;  NEEDS TO BE ADMITTED FOR OBSERVATION PT. HAS DEFIB  . HEMORROIDECTOMY  80's  . HOLMIUM LASER APPLICATION Right [id number]   Procedure: HOLMIUM LASER APPLICATION;  Surgeon: Irine Seal, MD;  [facility];  Service: Urology;  Laterality: Right;  . HYSTEROSCOPY WITH D & C  09/02/2010   Procedure: DILATATION AND CURETTAGE (D&C) /HYSTEROSCOPY;  Surgeon: Margarette Asal;  Location: [facility];  Service: Gynecology;  Laterality: N/A;  Dilation and Curettage with Hysteroscopy and Polypectomy  . IMPLANTABLE CARDIOVERTER DEFIBRILLATOR (ICD) GENERATOR CHANGE N/A 02/10/2014   Procedure: ICD GENERATOR CHANGE;  Surgeon: Sanda Klein, MD;   Location: [facility] CATH LAB;  Service: Cardiovascular;  Laterality: N/A;  . LEFT HEART CATHETERIZATION WITH CORONARY ANGIOGRAM N/A 07/28/2011   Procedure: LEFT HEART CATHETERIZATION WITH CORONARY ANGIOGRAM;  Surgeon: Lorretta Harp, MD;  Location[facility] C[facility] LAB;  Service: Cardiovascular;  Laterality: N/A;  . LEFT HEART CATHETERIZATION WITH CORONARY ANGIOGRAM N/A 12/31/2013   Procedure: LEFT HEART CATHETERIZATION WITH CORONARY ANGIOGRAM;  Surgeon: Wellington Hampshire, MD;  Location: [facility] CATH LAB;  Service: Cardiovascular;  Laterality: N/A;     OB History   No obstetric history on file.     Family History  Problem Relation Age of Onset  . Stroke Mother   . Other Mother        brain  tumor  . Other Father        MI  . Heart attack Father   . Stroke Sister   . Macular degeneration Sister   . Diabetes Daughter   . Cancer Daughter        ovarian  . Colon polyps Daughter   . Atrial fibrillation Sister   . Hyperlipidemia Sister   . Osteoporosis Sister   . Stroke Sister   . Uterine cancer Sister   . Heart attack Brother   . Heart disease Brother   . Asthma Brother   . Breast cancer Neg Hx     Social History   Tobacco Use  . Smoking status: Never Smoker  . Smokeless tobacco: Never Used  Vaping Use  . Vaping Use: Never used  Substance Use Topics  . Alcohol use: No  . Drug use: No    Home Medications Prior to Admission medications   Medication Sig Start Date End Date Taking? Authorizing Provider  acetaminophen (TYLENOL) 500 MG tablet Take 1 tablet (500 mg total) by mouth every 6 (six) hours as needed for mild pain, fever or headache. 04/05/20   Wynetta Emery, Clanford L, MD  apixaban (ELIQUIS) 2.5 MG TABS tablet Take 1 tablet (2.5 mg total) by mouth 2 (two) times daily. 02/11/20   Croitoru, Mihai, MD  carboxymethylcellulose (REFRESH PLUS) 0.5 % SOLN Place 1 drop into both eyes 3 (three) times daily as needed (dry/irritated eyes.).    [provider]  Cholecalciferol (VITAMIN D) 50 MCG  (2000 UT) CAPS Take 2,000 Units by mouth daily.     [provider]  docusate sodium (COLACE) 100 MG capsule Take 100 mg by mouth 2 (two) times daily.    [provider]  doxycycline (VIBRA-TABS) 100 MG tablet Take 1 tablet (100 mg total) by mouth 2 (two) times daily. 05/21/20   Sharion Balloon, FNP  feeding supplement (ENSURE ENLIVE / ENSURE PLUS) LIQD Take 237 mLs by mouth 2 (two) times daily between meals. 12/26/19   Mikhail, Velta Addison, DO  HYDROcodone-acetaminophen (NORCO/VICODIN) 5-325 MG tablet TAKE 1 TABLET EVERY 4 HOURS AS NEEDED FOR MODERATE PAIN 05/11/20   Cleon Gustin, MD  isosorbide mononitrate (IMDUR) 30 MG 24 hr tablet TAKE ONE HALF (1/2) TABLET DAILY 05/17/20   Croitoru, Dani Gobble, MD  levothyroxine (SYNTHROID) 100 MCG tablet Take 1 tablet (100 mcg total) by mouth daily. 01/27/20   Croitoru, Dani Gobble, MD  loratadine (CLARITIN) 10 MG tablet Take 1 tablet (10 mg total) by mouth daily as needed for allergies, rhinitis or itching. (for itching) 04/05/20   Johnson, Clanford L, MD  MELATONIN PO Take by mouth at bedtime.    [provider]  metoprolol tartrate (LOPRESSOR) 25 MG tablet Take 0.5 tablets (12.5 mg total) by mouth 2 (two) times daily. HOLD FOR SBP<95 OR HR<65 04/05/20   Johnson, Clanford L, MD  Multiple Vitamins-Minerals (CENTRUM SILVER ULTRA WOMENS PO) Take 1 tablet by mouth daily.    [provider]  nitroGLYCERIN (NITROSTAT) 0.4 MG SL tablet Place 1 tablet (0.4 mg total) under the tongue every 5 (five) minutes as needed for chest pain. 12/20/18   Claretta Fraise, MD  pantoprazole (PROTONIX) 40 MG tablet Take 1 tablet (40 mg total) by mouth 2 (two) times daily before a meal. 04/05/20   Johnson, Clanford L, MD  PARoxetine (PAXIL) 20 MG tablet TAKE (1) TABLET DAILY IN THE MORNING. 03/29/20   Ronnie Doss M, DO  predniSONE (STERAPRED UNI-PAK 21 TAB) 10 MG (21) TBPK  tablet Use as directed 05/21/20   Evelina Dun A, FNP  rosuvastatin (CRESTOR) 10 MG tablet  Take 1 tablet (10 mg total) by mouth daily. 02/24/20   [name], DO    Allergies    Sotalol, Tramadol, Actos [pioglitazone], Adhesive [tape], Atorvastatin, Bactrim [sulfamethoxazole-trimethoprim], Ciprofloxacin, Contrast media [iodinated diagnostic agents], Latex, Pedi-pre tape spray [wound dressing adhesive], and Sulfa antibiotics  Review of Systems   Review of Systems  Constitutional: Negative for fatigue and fever.  Respiratory: Positive for chest tightness. Negative for shortness of breath.   Cardiovascular: Negative for chest pain and leg swelling.  Gastrointestinal: Positive for abdominal pain. Negative for diarrhea, nausea and vomiting.  Genitourinary: Negative for dysuria.  Neurological: Positive for weakness.  All other systems reviewed and are negative.   Physical Exam Updated Vital Signs BP (!) 190/75   Pulse (!) 49   Temp 97.6 F (36.4 C) (Oral)   Resp 13   Ht 1.6 m ('5\' 3"'$ )   Wt 54 kg   SpO2 100%   BMI 21.08 kg/m   Physical Exam Vitals and nursing note reviewed.  Constitutional:      Appearance: She is well-developed.     Comments: Elderly, frail-appearing, no acute distress  HENT:     Head: Normocephalic and atraumatic.     Mouth/Throat:     Mouth: Mucous membranes are moist.  Eyes:     Pupils: Pupils are equal, round, and reactive to light.  Cardiovascular:     Rate and Rhythm: Normal rate and regular rhythm.     Heart sounds: Normal heart sounds.     Comments: AICD palpated left upper chest Pulmonary:     Effort: Pulmonary effort is normal. No respiratory distress.     Breath sounds: No wheezing.  Abdominal:     General: Bowel sounds are normal.     Palpations: Abdomen is soft.     Tenderness: There is abdominal tenderness.     Comments: Diffuse tenderness without rebound or guarding  Musculoskeletal:     Cervical back: Neck supple.     Right lower leg: No edema.     Left lower leg: No edema.  Skin:    General: Skin is warm and dry.   Neurological:     Mental Status: She is alert and oriented to person, place, and time.  Psychiatric:        Mood and Affect: Mood normal.     ED Results / Procedures / Treatments   Labs (all labs ordered are listed, but only abnormal results are displayed) Labs Reviewed  COMPREHENSIVE METABOLIC PANEL - Abnormal; Notable for the following components:      Result Value   Glucose, Bld 141 (*)    BUN 30 (*)    Creatinine, Ser 1.15 (*)    Albumin 3.0 (*)    GFR, Estimated 46 (*)    All other components within normal limits  URINALYSIS, ROUTINE W REFLEX MICROSCOPIC - Abnormal; Notable for the following components:   Color, Urine STRAW (*)    Specific Gravity, Urine 1.003 (*)    Glucose, UA 50 (*)    Hgb urine dipstick MODERATE (*)    All other components within normal limits  CBC WITH DIFFERENTIAL/PLATELET  LIPASE, BLOOD  TROPONIN I (HIGH SENSITIVITY)  TROPONIN I (HIGH SENSITIVITY)    EKG EKG Interpretation  Date/Time:  Thursday Jun 03 2020 00:28:55 EDT Ventricular Rate:  54 PR Interval:  293 QRS Duration: 138 QT Interval:  511 QTC Calculation:  485 R Axis:   -33 Text Interpretation: Sinus rhythm Prolonged PR interval Left bundle branch block Confirmed by Thayer Jew [phone]) on 06/03/2020 1:12:56 AM   Radiology DG Abdomen Acute W/Chest  Result Date: 06/03/2020 CLINICAL DATA:  chest and abdominal pain EXAM: DG ABDOMEN ACUTE WITH 1 VIEW CHEST COMPARISON:  CT March 28, 2020 FINDINGS: There is no evidence of dilated bowel loops or free intraperitoneal air. Gas visualized over the rectal vault. Bilateral renal calculi. Catheter tubing overlies the pelvis. Aortic atherosclerosis. The bow convex curvature of the lumbar spine with multilevel degenerative changes spine. Left chest generator device with 3 leads overlying the cardiac silhouette and additional leads overlie the mid abdomen. Borderline cardiac enlargement. Chronic bronchitic lung changes. No focal consolidation. No  visible pleural effusion or pneumothorax. IMPRESSION: 1. Nonobstructive bowel gas pattern. 2. Bilateral renal calculi. 3. No acute cardiopulmonary disease. Electronically Signed   By: Dahlia Bailiff MD   On: 06/03/2020 01:30    Procedures Procedures   Medications Ordered in ED Medications  HYDROcodone-acetaminophen (NORCO/VICODIN) 5-325 MG per tablet 1 tablet (1 tablet Oral Given 06/03/20 0328)  metoprolol tartrate (LOPRESSOR) tablet 12.5 mg (12.5 mg Oral Given 06/03/20 0352)  hydrALAZINE (APRESOLINE) injection 5 mg (5 mg Intravenous Given 06/03/20 0449)  fentaNYL (SUBLIMAZE) injection 50 mcg (50 mcg Intravenous Given 06/03/20 0448)    ED Course  I have reviewed the triage vital signs and the nursing notes.  Pertinent labs & imaging results that were available during my care of the patient were reviewed by me and considered in my medical decision making (see chart for details).  Clinical Course as of 06/03/20 [id number]  Thu Jun 03, 2020  0433 Patient persistently hypertensive.  Patient dosed her morning blood pressure medication and hydralazine. [CH]  0435 Patient reports persistent abdominal pain.  She has a history of chronic abdominal pain and has been worked up multiple times including multiple CT scans in the last 6 to 12 months.  However, her hypertension is out of proportion to prior evaluations and may be related to pain.  We will address pain and re-CT to further evaluate. [CH]  0608 Patient resting comfortably.  Patient's blood pressure remains elevated but she is currently without complaint.  I have addressed pain and she has been given her morning blood pressure medicine as well as an IV dose of hydralazine.  CT scan due at 645.  Will reassess. [CH]  [phone] Patient with persistently elevated blood pressure.  Otherwise her work-up is fairly unremarkable.  No indication of hypertensive emergency.  She states she feels generally better.  Her CT scan is unremarkable as has her recent abdominal  work-ups.  She states she would like to go home but is concerned about her blood pressure.  She has been both pain controlled and given her home metoprolol and IV hydralazine.  We will give her Imdur.  Will monitor.  If this trends downward, feel she can be discharged home. [CH]    Clinical Course User Index [CH] Somaly Marteney, Barbette Hair, MD   MDM Rules/Calculators/A&P                          Patient presents with not feeling well.  She reports continued abdominal pain but also had some chest pressure tonight.  She is notably hypertensive.  Her baseline blood pressures appear to be in a more normal range based on last clinic notes.  She is overall nontoxic-appearing.  She has some  mild diffuse tenderness to palpation to her belly but no signs of peritonitis.  Unclear if her symptoms are related or not.  She has significant cardiac history.  EKG shows no acute ischemia.  Troponin x2 is negative.  Pain is fairly atypical although she is significantly hypertensive.  CBC, CMP, lipase, and urinalysis are all unremarkable.  Acute abdominal series shows a nonobstructive bowel gas pattern and no evidence of pneumothorax or pneumonia.  Patient was treated initially with her morning metoprolol/Imdur and oral pain medication.  She had persistent abdominal pain.  She has had multiple scans and work-ups but given ongoing pain and age, will obtain a CT scan.  Additionally, she was given IV pain medication and a dose of IV hydralazine. Final Clinical Impression(s) / ED Diagnoses Final diagnoses:  Generalized abdominal pain  Hypertensive urgency  Atypical chest pain    Rx / DC Orders ED Discharge Orders    None       Wilson Dusenbery, Barbette Hair, MD 06/03/20 [id number]    Merryl Hacker, MD 06/03/20 [phone]

## 2020-06-03 NOTE — ED Triage Notes (Signed)
Pt arrives via [facility] c/o generalized weakness for the past week.

## 2020-06-03 NOTE — ED Notes (Signed)
Respirations are 14.  Patient resting comfortably.  Shallow breathing difficult for machine to monitor

## 2020-06-03 NOTE — ED Provider Notes (Signed)
Patient is abdominal pain is resolved.  On the Imdur blood pressure improved significantly bringing systolics down to around [id number] -[id number].  Patient stable for discharge home and follow-up.   Fredia Sorrow, MD 06/03/20 [phone]

## 2020-06-03 NOTE — ED Notes (Signed)
ED Provider at bedside. 

## 2020-06-04 ENCOUNTER — Ambulatory Visit: Payer: Medicare Other | Admitting: Nurse Practitioner

## 2020-06-04 ENCOUNTER — Encounter: Payer: Self-pay | Admitting: Family Medicine

## 2020-06-09 ENCOUNTER — Other Ambulatory Visit: Payer: Self-pay

## 2020-06-09 ENCOUNTER — Encounter (INDEPENDENT_AMBULATORY_CARE_PROVIDER_SITE_OTHER): Payer: Self-pay | Admitting: Ophthalmology

## 2020-06-09 ENCOUNTER — Ambulatory Visit (INDEPENDENT_AMBULATORY_CARE_PROVIDER_SITE_OTHER): Payer: Medicare Other | Admitting: Ophthalmology

## 2020-06-09 DIAGNOSIS — E119 Type 2 diabetes mellitus without complications: Secondary | ICD-10-CM | POA: Diagnosis not present

## 2020-06-09 DIAGNOSIS — H353114 Nonexudative age-related macular degeneration, right eye, advanced atrophic with subfoveal involvement: Secondary | ICD-10-CM | POA: Diagnosis not present

## 2020-06-09 DIAGNOSIS — H353211 Exudative age-related macular degeneration, right eye, with active choroidal neovascularization: Secondary | ICD-10-CM | POA: Diagnosis not present

## 2020-06-09 DIAGNOSIS — H353212 Exudative age-related macular degeneration, right eye, with inactive choroidal neovascularization: Secondary | ICD-10-CM | POA: Diagnosis not present

## 2020-06-09 DIAGNOSIS — H353124 Nonexudative age-related macular degeneration, left eye, advanced atrophic with subfoveal involvement: Secondary | ICD-10-CM | POA: Diagnosis not present

## 2020-06-09 NOTE — Progress Notes (Signed)
06/09/2020     CHIEF COMPLAINT Patient presents for Retina Follow Up (Fu ou oct/Pt states, "I have been in and out of the hospital since last August and I am having a lot of intestinal issues. I have lost about 70 pounds. My va has gotten much worse. My OD has gotten so bad that I cannot see the big print on the bottom of my TV and I cannot see to write or read."/)   HISTORY OF PRESENT ILLNESS: [name] is a 87 y.o. female who presents to the clinic today for:   HPI    Retina Follow Up    Diagnosis: Wet AMD   Laterality: both eyes   Onset: 8 months ago   Severity: mild   Duration: 8 months   Course: rapidly worsening   Comments: Fu ou oct Pt states, "I have been in and out of the hospital since last August and I am having a lot of intestinal issues. I have lost about 70 pounds. My va has gotten much worse. My OD has gotten so bad that I cannot see the big print on the bottom of my TV and I cannot see to write or read."        Last edited by Kendra Opitz, COA on 06/09/2020  9:36 AM. (History)      Referring physician: [name], DO [place],  [place] [id number]  HISTORICAL INFORMATION:   Selected notes from the MEDICAL RECORD NUMBER    Lab Results  Component Value Date   HGBA1C 6.6 08/15/2019     CURRENT MEDICATIONS: Current Outpatient Medications (Ophthalmic Drugs)  Medication Sig  . carboxymethylcellulose (REFRESH PLUS) 0.5 % SOLN Place 1 drop into both eyes 3 (three) times daily as needed (dry/irritated eyes.).   No current facility-administered medications for this visit. (Ophthalmic Drugs)   Current Outpatient Medications (Other)  Medication Sig  . acetaminophen (TYLENOL) 500 MG tablet Take 1 tablet (500 mg total) by mouth every 6 (six) hours as needed for mild pain, fever or headache.  Marland Kitchen apixaban (ELIQUIS) 2.5 MG TABS tablet Take 1 tablet (2.5 mg total) by mouth 2 (two) times daily.  . Cholecalciferol (VITAMIN D) 50 MCG (2000 UT)  CAPS Take 2,000 Units by mouth daily.   Marland Kitchen docusate sodium (COLACE) 100 MG capsule Take 100 mg by mouth 2 (two) times daily.  Marland Kitchen doxycycline (VIBRA-TABS) 100 MG tablet Take 1 tablet (100 mg total) by mouth 2 (two) times daily. (Patient not taking: Reported on 06/03/2020)  . feeding supplement (ENSURE ENLIVE / ENSURE PLUS) LIQD Take 237 mLs by mouth 2 (two) times daily between meals.  Marland Kitchen HYDROcodone-acetaminophen (NORCO/VICODIN) 5-325 MG tablet TAKE 1 TABLET EVERY 4 HOURS AS NEEDED FOR MODERATE PAIN  . isosorbide mononitrate (IMDUR) 30 MG 24 hr tablet TAKE ONE HALF (1/2) TABLET DAILY  . levothyroxine (SYNTHROID) 100 MCG tablet Take 1 tablet (100 mcg total) by mouth daily.  Marland Kitchen loratadine (CLARITIN) 10 MG tablet Take 1 tablet (10 mg total) by mouth daily as needed for allergies, rhinitis or itching. (for itching)  . MELATONIN PO Take by mouth at bedtime.  . metoprolol tartrate (LOPRESSOR) 25 MG tablet Take 0.5 tablets (12.5 mg total) by mouth 2 (two) times daily. HOLD FOR SBP<95 OR HR<65  . Multiple Vitamins-Minerals (CENTRUM SILVER ULTRA WOMENS PO) Take 1 tablet by mouth daily.  . nitroGLYCERIN (NITROSTAT) 0.4 MG SL tablet Place 1 tablet (0.4 mg total) under the tongue every 5 (  five) minutes as needed for chest pain.  . pantoprazole (PROTONIX) 40 MG tablet Take 1 tablet (40 mg total) by mouth 2 (two) times daily before a meal.  . PARoxetine (PAXIL) 20 MG tablet TAKE (1) TABLET DAILY IN THE MORNING.  . predniSONE (STERAPRED UNI-PAK 21 TAB) 10 MG (21) TBPK tablet Use as directed (Patient not taking: Reported on 06/03/2020)  . rosuvastatin (CRESTOR) 10 MG tablet Take 1 tablet (10 mg total) by mouth daily.   No current facility-administered medications for this visit. (Other)      REVIEW OF SYSTEMS:    ALLERGIES Allergies  Allergen Reactions  . Sotalol Other (See Comments)    Torsades   . Tramadol Other (See Comments)    Hallucination, bad-dream  . Actos [Pioglitazone] Swelling  . Adhesive  [Tape] Other (See Comments)    Causes sores  . Atorvastatin Other (See Comments)    myalgia  . Bactrim [Sulfamethoxazole-Trimethoprim] Itching and Rash    Itchy rash  . Ciprofloxacin Nausea Only  . Contrast Media [Iodinated Diagnostic Agents] Other (See Comments)    Headache (no action/pre-med required)  . Latex Rash  . Pedi-Pre Tape Spray [Wound Dressing Adhesive] Other (See Comments)    Causes sores  . Sulfa Antibiotics Other (See Comments)    Causes sores    PAST MEDICAL HISTORY Past Medical History:  Diagnosis Date  . AICD (automatic cardioverter/defibrillator) present   . Allergy    SESONAL  . Anxiety   . ARTHRITIS   . Arthritis   . Atrial fibrillation ([place])   . CAD (coronary artery disease)    a. history of cardiac arrest 1993;  b. s/p LAD/LCX stenting in 2003;  c. 07/2011 Cath: LM nl, LAD patent stent, D1 80ost, LCX patent stent, RCA min irregs;  d. 04/2012 MV: EF 66%, no ishcemia;  e. 12/2013 Echo: Ef 55%, no rwma, Gr 1 DD, triv AI/MR, mildly dil LA;  f. 12/2013 Lexi MV: intermediate risk - apical ischemia and inf/infsept fixed defect, ? artifactual.  . CAD in native artery 12/31/2013   Previous stents to LAD and Ramus patent on cath today 12/31/13 also There is severe disease in the ostial first diagonal which is unchanged from most recent cardiac catheterization. The right coronary artery could not be engaged selectively but nonselective angiography showed no significant disease in the proximal and midsegment.     . Cataract    DENIES  . Chronic back pain   . Chronic sinus bradycardia   . CKD (chronic kidney disease), stage III ([place])   . Clotting disorder (HCC)    DVT  . COLITIS 12/02/2007   Qualifier: Diagnosis of  By: Nils Pyle CMA ([place]), [name]    . Diabetes mellitus without complication ([place])    DENIES  . Diverticulosis of colon (without mention of hemorrhage) 2007   Colonoscopy   . Esophageal stricture    a. 2012 s/p dil.  . Esophagitis, unspecified    a.  2012 EGD  . EXTERNAL HEMORRHOIDS   . GERD (gastroesophageal reflux disease)    omeprazole  . H/O hiatal hernia   . Hiatal hernia    a. 2012 EGD.  Marland Kitchen History of DVT in the past, not on Coumadin now    left  leg  . HYPERCHOLESTEROLEMIA   . Hypertension   . ICD (implantable cardiac defibrillator) in place    a. s/p initial ICD in 1993 in setting of cardiac arrest;  b. 01/2007 gen change: Guidant T135 Vitality DS VR single lead  ICD.  . Macular degeneration    gets injecton in eye every 5 weeks- last injection - 05/03/2013   . Myocardial infarction ([place]) 1993  . Near syncope 05/22/2015  . Nephrolithiasis, just saw Dr [name]   . Orthostatic hypotension   . Peripheral vascular disease ([place])    ???  . RECTAL BLEEDING 12/03/2007   Qualifier: Diagnosis of  By: Sharlett Iles MD Byrd Hesselbach   . Unspecified gastritis and gastroduodenitis without mention of hemorrhage    a. 2003 EGD->not noted on 2012 EGD.  Marland Kitchen Unstable angina, neg MI, cath stable.maybe GI 12/30/2013   Past Surgical History:  Procedure Laterality Date  . BREAST BIOPSY Left 11/2013  . CARDIAC CATHETERIZATION    . CARDIAC CATHETERIZATION  01/01/15   patent stents -there is severe disease in ostial 1st diag but without change.   Marland Kitchen CARDIAC DEFIBRILLATOR PLACEMENT  02/05/2007   Guidant  . CARDIOVASCULAR STRESS TEST  12/30/2013   abnormal  . CARDIOVERSION N/A 02/13/2019   Procedure: CARDIOVERSION;  Surgeon: Sanda Klein, MD;  Location: [facility];  Service: Cardiovascular;  Laterality: N/A;  . CATARACT EXTRACTION Right    Dr. Katy Fitch  . COLONOSCOPY    . COLONOSCOPY WITH PROPOFOL N/A 03/30/2020   Procedure: COLONOSCOPY WITH PROPOFOL;  Surgeon: Harvel Quale, MD;  [facility];  Service: Gastroenterology;  Laterality: N/A;  . coronary stents     . CYSTOSCOPY WITH URETEROSCOPY Right 05/20/2013   Procedure: CYSTOSCOPY, RIGHT URETEROSCOPY STONE EXTRACTION, Insertion of right DOUBLE J STENT ;  Surgeon: Irine Seal, MD;  Location: [facility];  Service: Urology;  Laterality: Right;  . CYSTOSCOPY WITH URETEROSCOPY AND STENT PLACEMENT N/A 06/03/2013   Procedure: SECOND LOOK CYSTOSCOPY WITH URETEROSCOPY  HOLMIUM LASER LITHO AND STONE EXTRACTION Sammie Bench ;  Surgeon: Malka So, MD;  Location: [facility];  Service: Urology;  Laterality: N/A;  . ESOPHAGOGASTRODUODENOSCOPY (EGD) WITH PROPOFOL N/A 11/20/2019   Procedure: ESOPHAGOGASTRODUODENOSCOPY (EGD) WITH PROPOFOL;  Surgeon: Daneil Dolin, MD;  [facility];  Service: Endoscopy;  Laterality: N/A;  . GIVENS CAPSULE STUDY N/A 10/30/2016   Procedure: GIVENS CAPSULE STUDY;  Surgeon: Irene Shipper, MD;  Location: [facility];  Service: Endoscopy;  Laterality: N/A;  NEEDS TO BE ADMITTED FOR OBSERVATION PT. HAS DEFIB  . HEMORROIDECTOMY  80's  . HOLMIUM LASER APPLICATION Right [id number]   Procedure: HOLMIUM LASER APPLICATION;  Surgeon: Irine Seal, MD;  Location[facility];  Service: Urology;  Laterality: Right;  . HYSTEROSCOPY WITH D & C  09/02/2010   Procedure: DILATATION AND CURETTAGE (D&C) /HYSTEROSCOPY;  Surgeon: Margarette Asal;  Location: [facility];  Service: Gynecology;  Laterality: N/A;  Dilation and Curettage with Hysteroscopy and Polypectomy  . IMPLANTABLE CARDIOVERTER DEFIBRILLATOR (ICD) GENERATOR CHANGE N/A 02/10/2014   Procedure: ICD GENERATOR CHANGE;  Surgeon: Sanda Klein, MD;  Location: [facility];  Service: Cardiovascular;  Laterality: N/A;  . LEFT HEART CATHETERIZATION WITH CORONARY ANGIOGRAM N/A 07/28/2011   Procedure: LEFT HEART CATHETERIZATION WITH CORONARY ANGIOGRAM;  Surgeon: Lorretta Harp, MD;  Location: [facility];  Service: Cardiovascular;  Laterality: N/A;  . LEFT HEART CATHETERIZATION WITH CORONARY ANGIOGRAM N/A 12/31/2013   Procedure: LEFT HEART CATHETERIZATION WITH CORONARY ANGIOGRAM;  Surgeon: Wellington Hampshire, MD;  Location: [facility] CATH [facility];  Service: Cardiovascular;  Laterality: N/A;    FAMILY HISTORY Family History  Problem  Relation Age of Onset  . Stroke Mother   . Other Mother  brain tumor  . Other Father        MI  . Heart attack Father   . Stroke Sister   . Macular degeneration Sister   . Diabetes Daughter   . Cancer Daughter        ovarian  . Colon polyps Daughter   . Atrial fibrillation Sister   . Hyperlipidemia Sister   . Osteoporosis Sister   . Stroke Sister   . Uterine cancer Sister   . Heart attack Brother   . Heart disease Brother   . Asthma Brother   . Breast cancer Neg Hx     SOCIAL HISTORY Social History   Tobacco Use  . Smoking status: Never Smoker  . Smokeless tobacco: Never Used  Vaping Use  . Vaping Use: Never used  Substance Use Topics  . Alcohol use: No  . Drug use: No         OPHTHALMIC EXAM:  Base Eye Exam    Visual Acuity (ETDRS)      Right Left   Dist [place] 20/200 -1 CF at 3'   Dist ph [place] 20/80 -2        Tonometry (Tonopen, 9:40 AM)      Right Left   Pressure 17 21       Pupils      Pupils Dark Light Shape React APD   Right PERRL 4 4 Round Minimal None   Left PERRL 4 4 Round Minimal None       Visual Fields      Left Right   Restrictions Partial outer superior temporal, inferior temporal, superior nasal, inferior nasal deficiencies Partial outer superior temporal, inferior temporal, superior nasal, inferior nasal deficiencies       Neuro/Psych    Oriented x3: Yes   Mood/Affect: Normal       Dilation    Both eyes: 1.0% Mydriacyl, 2.5% Phenylephrine @ 9:40 AM        Slit Lamp and Fundus Exam    External Exam      Right Left   External Normal Normal       Slit Lamp Exam      Right Left   Lids/Lashes Normal Normal   Conjunctiva/Sclera White and quiet White and quiet   Cornea Clear Clear   Anterior Chamber Deep and quiet Deep and quiet   Iris Round and reactive Round and reactive   Lens Posterior chamber intraocular lens Posterior chamber intraocular lens   Anterior Vitreous Normal Normal       Fundus Exam      Right Left    Posterior Vitreous Central vitreous floaters, Posterior vitreous detachment Posterior vitreous detachment   Disc Normal Normal   C/D Ratio 0.15 0.15   Macula Atrophy central atrophy in the FAZ, Mottling, Retinal pigment epithelial mottling with subretinal red telangiectasia and/or CVN in the region of atrophy central atrophy in the FAZ Geographic atrophy,  Hard drusen, Advanced age related macular degeneration   Vessels Normal, , no DR Normal,, no DR   Periphery Normal Normal          IMAGING AND PROCEDURES  Imaging and Procedures for 06/09/20  OCT, Retina - OU - Both Eyes       Right Eye Quality was good. Scan locations included subfoveal. Central Foveal Thickness: 269. Progression has been stable. Findings include subretinal scarring, abnormal foveal contour, no IRF, no SRF, subretinal hyper-reflective material, outer retinal atrophy, central retinal atrophy.   Left Eye Quality was good.  Scan locations included subfoveal. Central Foveal Thickness: 283. Progression has been stable. Findings include no IRF, no SRF, abnormal foveal contour, subretinal hyper-reflective material, central retinal atrophy, outer retinal atrophy, inner retinal atrophy.   Notes OD macular findings stable with subretinal scarring, last injection was done 9 months previous  Bilateral dry ARMD, no effective therapy at this time.  Bilateral subfoveal disciform limit vision as well but no active disease at this time.                ASSESSMENT/PLAN:  Exudative age-related macular degeneration of right eye with inactive choroidal neovascularization (HCC) No active CNVM, nothing has been treated in either eye for over 9 months.  Dry atrophic AMD in disciform scar account for the acuity  Diabetes mellitus type 2, diet-controlled (HCC) No detectable diabetic retinopathy  Advanced nonexudative age-related macular degeneration of right eye with subfoveal involvement The nature of dry age related macular  degeneration was discussed with the patient as well as its possible conversion to wet. The results of the AREDS 2 study was discussed with the patient. A diet rich in dark leafy green vegetables was advised and specific recommendations were made regarding supplements with AREDS 2 formulation . Control of hypertension and serum cholesterol may slow the disease. Smoking cessation is mandatory to slow the disease and diminish the risk of progressing to wet age related macular degeneration. The patient was instructed in the use of an [place] and was told to return immediately for any changes in the Grid. Stressed to the patient do not rub eyes  Dry atrophic changes in the fovea  limit visual functioning, no signs of growth of the scotomas at this time      ICD-10-CM   1. Exudative age-related macular degeneration of right eye with active choroidal neovascularization (HCC)  H35.3211 OCT, Retina - OU - Both Eyes  2. Advanced nonexudative age-related macular degeneration of left eye with subfoveal involvement  H35.3124   3. Exudative age-related macular degeneration of right eye with inactive choroidal neovascularization ([place])  H35.3212   4. Diabetes mellitus type 2, diet-controlled ([place])  E11.9   5. Advanced nonexudative age-related macular degeneration of right eye with subfoveal involvement  H35.3114     1.  No longer with active CNVM OD, resolved at this time with residual disciform scar limiting acuity in conjunction with dry atrophic subfoveal ARMD OU.  2.  No  Detectable diabetic retinopathy.  3.  Ophthalmic Meds Ordered this visit:  No orders of the defined types were placed in this encounter.      Return in about 9 months (around 03/12/2021) for DILATE OU, COLOR FP, OCT.  There are no Patient Instructions on file for this visit.   Explained the diagnoses, plan, and follow up with the patient and they expressed understanding.  Patient expressed understanding of the importance of  proper follow up care.   Clent Demark Reita Shindler M.D. Diseases & Surgery of the Retina and Vitreous Retina & Diabetic [place] 06/09/20     Abbreviations: M myopia (nearsighted); A astigmatism; H hyperopia (farsighted); P presbyopia; Mrx spectacle prescription;  CTL contact lenses; OD right eye; OS left eye; OU both eyes  XT exotropia; ET esotropia; PEK punctate epithelial keratitis; PEE punctate epithelial erosions; DES dry eye syndrome; MGD meibomian gland dysfunction; ATs artificial tears; PFAT's preservative free artificial tears; [place] nuclear sclerotic cataract; PSC posterior subcapsular cataract; ERM epi-retinal membrane; PVD posterior vitreous detachment; RD retinal detachment; DM diabetes mellitus; DR diabetic retinopathy; NPDR  non-proliferative diabetic retinopathy; PDR proliferative diabetic retinopathy; CSME clinically significant macular edema; DME diabetic macular edema; dbh dot blot hemorrhages; CWS cotton wool spot; POAG primary open angle glaucoma; C/D cup-to-disc ratio; HVF humphrey visual field; GVF goldmann visual field; OCT optical coherence tomography; IOP intraocular pressure; BRVO Branch retinal vein occlusion; CRVO central retinal vein occlusion; CRAO central retinal artery occlusion; BRAO branch retinal artery occlusion; RT retinal tear; SB scleral buckle; PPV pars plana vitrectomy; VH Vitreous hemorrhage; PRP panretinal laser photocoagulation; IVK intravitreal kenalog; VMT vitreomacular traction; MH Macular hole;  NVD neovascularization of the disc; NVE neovascularization elsewhere; AREDS age related eye disease study; ARMD age related macular degeneration; POAG primary open angle glaucoma; EBMD epithelial/anterior basement membrane dystrophy; ACIOL anterior chamber intraocular lens; IOL intraocular lens; PCIOL posterior chamber intraocular lens; Phaco/IOL phacoemulsification with intraocular lens placement; [place] photorefractive keratectomy; LASIK laser assisted in situ keratomileusis;  HTN hypertension; DM diabetes mellitus; COPD chronic obstructive pulmonary disease

## 2020-06-09 NOTE — Assessment & Plan Note (Signed)
The nature of dry age related macular degeneration was discussed with the patient as well as its possible conversion to wet. The results of the AREDS 2 study was discussed with the patient. A diet rich in dark leafy green vegetables was advised and specific recommendations were made regarding supplements with AREDS 2 formulation . Control of hypertension and serum cholesterol may slow the disease. Smoking cessation is mandatory to slow the disease and diminish the risk of progressing to wet age related macular degeneration. The patient was instructed in the use of an [place] and was told to return immediately for any changes in the Grid. Stressed to the patient do not rub eyes  Dry atrophic changes in the fovea  limit visual functioning, no signs of growth of the scotomas at this time

## 2020-06-09 NOTE — Assessment & Plan Note (Signed)
No active CNVM, nothing has been treated in either eye for over 9 months.  Dry atrophic AMD in disciform scar account for the acuity

## 2020-06-09 NOTE — Assessment & Plan Note (Signed)
No detectable diabetic retinopathy 

## 2020-06-16 ENCOUNTER — Other Ambulatory Visit: Payer: Self-pay | Admitting: Family Medicine

## 2020-06-20 ENCOUNTER — Encounter (HOSPITAL_COMMUNITY): Payer: Self-pay | Admitting: Emergency Medicine

## 2020-06-20 ENCOUNTER — Emergency Department (HOSPITAL_COMMUNITY): Payer: Medicare Other

## 2020-06-20 ENCOUNTER — Other Ambulatory Visit: Payer: Self-pay

## 2020-06-20 ENCOUNTER — Inpatient Hospital Stay (HOSPITAL_COMMUNITY)
Admission: EM | Admit: 2020-06-20 | Discharge: 2020-06-23 | DRG: 638 | Disposition: A | Discharge status: Home Health | Payer: Medicare Other | Attending: Family Medicine | Admitting: Family Medicine

## 2020-06-20 DIAGNOSIS — L02612 Cutaneous abscess of left foot: Secondary | ICD-10-CM | POA: Diagnosis present

## 2020-06-20 DIAGNOSIS — Z95810 Presence of automatic (implantable) cardiac defibrillator: Secondary | ICD-10-CM | POA: Diagnosis present

## 2020-06-20 DIAGNOSIS — Z888 Allergy status to other drugs, medicaments and biological substances status: Secondary | ICD-10-CM

## 2020-06-20 DIAGNOSIS — L03116 Cellulitis of left lower limb: Secondary | ICD-10-CM | POA: Diagnosis present

## 2020-06-20 DIAGNOSIS — Z91040 Latex allergy status: Secondary | ICD-10-CM

## 2020-06-20 DIAGNOSIS — E11621 Type 2 diabetes mellitus with foot ulcer: Secondary | ICD-10-CM | POA: Diagnosis present

## 2020-06-20 DIAGNOSIS — I10 Essential (primary) hypertension: Secondary | ICD-10-CM | POA: Diagnosis not present

## 2020-06-20 DIAGNOSIS — M1990 Unspecified osteoarthritis, unspecified site: Secondary | ICD-10-CM | POA: Diagnosis present

## 2020-06-20 DIAGNOSIS — I129 Hypertensive chronic kidney disease with stage 1 through stage 4 chronic kidney disease, or unspecified chronic kidney disease: Secondary | ICD-10-CM | POA: Diagnosis present

## 2020-06-20 DIAGNOSIS — Z91048 Other nonmedicinal substance allergy status: Secondary | ICD-10-CM

## 2020-06-20 DIAGNOSIS — L02416 Cutaneous abscess of left lower limb: Secondary | ICD-10-CM | POA: Diagnosis present

## 2020-06-20 DIAGNOSIS — M19072 Primary osteoarthritis, left ankle and foot: Secondary | ICD-10-CM | POA: Diagnosis not present

## 2020-06-20 DIAGNOSIS — Z885 Allergy status to narcotic agent status: Secondary | ICD-10-CM

## 2020-06-20 DIAGNOSIS — Z882 Allergy status to sulfonamides status: Secondary | ICD-10-CM

## 2020-06-20 DIAGNOSIS — H353212 Exudative age-related macular degeneration, right eye, with inactive choroidal neovascularization: Secondary | ICD-10-CM | POA: Diagnosis present

## 2020-06-20 DIAGNOSIS — Z66 Do not resuscitate: Secondary | ICD-10-CM | POA: Diagnosis present

## 2020-06-20 DIAGNOSIS — Z20822 Contact with and (suspected) exposure to covid-19: Secondary | ICD-10-CM | POA: Diagnosis present

## 2020-06-20 DIAGNOSIS — H353124 Nonexudative age-related macular degeneration, left eye, advanced atrophic with subfoveal involvement: Secondary | ICD-10-CM | POA: Diagnosis present

## 2020-06-20 DIAGNOSIS — R079 Chest pain, unspecified: Secondary | ICD-10-CM

## 2020-06-20 DIAGNOSIS — Z87440 Personal history of urinary (tract) infections: Secondary | ICD-10-CM

## 2020-06-20 DIAGNOSIS — E119 Type 2 diabetes mellitus without complications: Secondary | ICD-10-CM

## 2020-06-20 DIAGNOSIS — Z79890 Hormone replacement therapy (postmenopausal): Secondary | ICD-10-CM

## 2020-06-20 DIAGNOSIS — K219 Gastro-esophageal reflux disease without esophagitis: Secondary | ICD-10-CM | POA: Diagnosis present

## 2020-06-20 DIAGNOSIS — M7989 Other specified soft tissue disorders: Secondary | ICD-10-CM | POA: Diagnosis not present

## 2020-06-20 DIAGNOSIS — I4819 Other persistent atrial fibrillation: Secondary | ICD-10-CM | POA: Diagnosis not present

## 2020-06-20 DIAGNOSIS — I82409 Acute embolism and thrombosis of unspecified deep veins of unspecified lower extremity: Secondary | ICD-10-CM | POA: Diagnosis present

## 2020-06-20 DIAGNOSIS — I252 Old myocardial infarction: Secondary | ICD-10-CM

## 2020-06-20 DIAGNOSIS — E1122 Type 2 diabetes mellitus with diabetic chronic kidney disease: Secondary | ICD-10-CM | POA: Diagnosis present

## 2020-06-20 DIAGNOSIS — N1831 Chronic kidney disease, stage 3a: Secondary | ICD-10-CM | POA: Diagnosis present

## 2020-06-20 DIAGNOSIS — Z87442 Personal history of urinary calculi: Secondary | ICD-10-CM

## 2020-06-20 DIAGNOSIS — Z79899 Other long term (current) drug therapy: Secondary | ICD-10-CM

## 2020-06-20 DIAGNOSIS — I2510 Atherosclerotic heart disease of native coronary artery without angina pectoris: Secondary | ICD-10-CM | POA: Diagnosis present

## 2020-06-20 DIAGNOSIS — Z83438 Family history of other disorder of lipoprotein metabolism and other lipidemia: Secondary | ICD-10-CM

## 2020-06-20 DIAGNOSIS — Z833 Family history of diabetes mellitus: Secondary | ICD-10-CM

## 2020-06-20 DIAGNOSIS — E039 Hypothyroidism, unspecified: Secondary | ICD-10-CM | POA: Diagnosis present

## 2020-06-20 DIAGNOSIS — Z91041 Radiographic dye allergy status: Secondary | ICD-10-CM

## 2020-06-20 DIAGNOSIS — Z7901 Long term (current) use of anticoagulants: Secondary | ICD-10-CM

## 2020-06-20 DIAGNOSIS — E11628 Type 2 diabetes mellitus with other skin complications: Principal | ICD-10-CM | POA: Diagnosis present

## 2020-06-20 DIAGNOSIS — Z955 Presence of coronary angioplasty implant and graft: Secondary | ICD-10-CM

## 2020-06-20 DIAGNOSIS — Z8674 Personal history of sudden cardiac arrest: Secondary | ICD-10-CM

## 2020-06-20 DIAGNOSIS — E785 Hyperlipidemia, unspecified: Secondary | ICD-10-CM | POA: Diagnosis present

## 2020-06-20 DIAGNOSIS — K5730 Diverticulosis of large intestine without perforation or abscess without bleeding: Secondary | ICD-10-CM | POA: Diagnosis present

## 2020-06-20 DIAGNOSIS — R001 Bradycardia, unspecified: Secondary | ICD-10-CM | POA: Diagnosis present

## 2020-06-20 DIAGNOSIS — Z86718 Personal history of other venous thrombosis and embolism: Secondary | ICD-10-CM

## 2020-06-20 DIAGNOSIS — L97529 Non-pressure chronic ulcer of other part of left foot with unspecified severity: Secondary | ICD-10-CM | POA: Diagnosis not present

## 2020-06-20 DIAGNOSIS — L03032 Cellulitis of left toe: Secondary | ICD-10-CM | POA: Diagnosis present

## 2020-06-20 DIAGNOSIS — M549 Dorsalgia, unspecified: Secondary | ICD-10-CM | POA: Diagnosis present

## 2020-06-20 DIAGNOSIS — R110 Nausea: Secondary | ICD-10-CM | POA: Diagnosis not present

## 2020-06-20 DIAGNOSIS — Z8249 Family history of ischemic heart disease and other diseases of the circulatory system: Secondary | ICD-10-CM

## 2020-06-20 DIAGNOSIS — I480 Paroxysmal atrial fibrillation: Secondary | ICD-10-CM | POA: Diagnosis present

## 2020-06-20 DIAGNOSIS — G8929 Other chronic pain: Secondary | ICD-10-CM | POA: Diagnosis present

## 2020-06-20 LAB — URINALYSIS, ROUTINE W REFLEX MICROSCOPIC
Bilirubin Urine: NEGATIVE
Glucose, UA: 50 mg/dL — AB
Hgb urine dipstick: NEGATIVE
Ketones, ur: NEGATIVE mg/dL
Leukocytes,Ua: NEGATIVE
Nitrite: NEGATIVE
Protein, ur: NEGATIVE mg/dL
Specific Gravity, Urine: 1.004 — ABNORMAL LOW (ref 1.005–1.030)
pH: 7 (ref 5.0–8.0)

## 2020-06-20 LAB — GLUCOSE, CAPILLARY
Glucose-Capillary: 112 mg/dL — ABNORMAL HIGH (ref 70–99)
Glucose-Capillary: 57 mg/dL — ABNORMAL LOW (ref 70–99)

## 2020-06-20 LAB — TROPONIN I (HIGH SENSITIVITY)
Troponin I (High Sensitivity): 7 ng/L (ref <18)
Troponin I (High Sensitivity): 8 ng/L (ref <18)

## 2020-06-20 LAB — COMPREHENSIVE METABOLIC PANEL
ALT: 13 U/L (ref 0–44)
AST: 19 U/L (ref 15–41)
Albumin: 3 g/dL — ABNORMAL LOW (ref 3.5–5.0)
Alkaline Phosphatase: 73 U/L (ref 38–126)
Anion gap: 7 (ref 5–15)
BUN: 16 mg/dL (ref 8–23)
CO2: 25 mmol/L (ref 22–32)
Calcium: 8.4 mg/dL — ABNORMAL LOW (ref 8.9–10.3)
Chloride: 100 mmol/L (ref 98–111)
Creatinine, Ser: 1.08 mg/dL — ABNORMAL HIGH (ref 0.44–1.00)
GFR, Estimated: 50 mL/min — ABNORMAL LOW (ref >60)
Glucose, Bld: 204 mg/dL — ABNORMAL HIGH (ref 70–99)
Potassium: 3.6 mmol/L (ref 3.5–5.1)
Sodium: 132 mmol/L — ABNORMAL LOW (ref 135–145)
Total Bilirubin: 0.6 mg/dL (ref 0.3–1.2)
Total Protein: 6.5 g/dL (ref 6.5–8.1)

## 2020-06-20 LAB — MAGNESIUM: Magnesium: 2 mg/dL (ref 1.7–2.4)

## 2020-06-20 LAB — CBC WITH DIFFERENTIAL/PLATELET
Abs Immature Granulocytes: 0.02 10*3/uL (ref 0.00–0.07)
Basophils Absolute: 0 10*3/uL (ref 0.0–0.1)
Basophils Relative: 1 %
Eosinophils Absolute: 0.3 10*3/uL (ref 0.0–0.5)
Eosinophils Relative: 5 %
HCT: 34.4 % — ABNORMAL LOW (ref 36.0–46.0)
Hemoglobin: 11.1 g/dL — ABNORMAL LOW (ref 12.0–15.0)
Immature Granulocytes: 0 %
Lymphocytes Relative: 20 %
Lymphs Abs: 1.1 10*3/uL (ref 0.7–4.0)
MCH: 30.9 pg (ref 26.0–34.0)
MCHC: 32.3 g/dL (ref 30.0–36.0)
MCV: 95.8 fL (ref 80.0–100.0)
Monocytes Absolute: 0.6 10*3/uL (ref 0.1–1.0)
Monocytes Relative: 12 %
Neutro Abs: 3.2 10*3/uL (ref 1.7–7.7)
Neutrophils Relative %: 62 %
Platelets: 285 10*3/uL (ref 150–400)
RBC: 3.59 MIL/uL — ABNORMAL LOW (ref 3.87–5.11)
RDW: 15.4 % (ref 11.5–15.5)
WBC: 5.2 10*3/uL (ref 4.0–10.5)
nRBC: 0 % (ref 0.0–0.2)

## 2020-06-20 LAB — PROTIME-INR
INR: 1.2 (ref 0.8–1.2)
Prothrombin Time: 15 seconds (ref 11.4–15.2)

## 2020-06-20 LAB — LACTIC ACID, PLASMA
Lactic Acid, Venous: 2 mmol/L (ref 0.5–1.9)
Lactic Acid, Venous: 1.2 mmol/L (ref 0.5–1.9)

## 2020-06-20 LAB — APTT: aPTT: 33 seconds (ref 24–36)

## 2020-06-20 MED ORDER — SODIUM CHLORIDE 0.9 % IV BOLUS
500.0000 mL | Freq: Once | INTRAVENOUS | Status: AC
  Administered 2020-06-20: 500 mL via INTRAVENOUS

## 2020-06-20 MED ORDER — ACETAMINOPHEN 650 MG RE SUPP: 650.0000 mg | Freq: Four times a day (QID) | RECTAL | Status: DC | PRN

## 2020-06-20 MED ORDER — DEXTROSE 50 % IV SOLN
INTRAVENOUS | Status: AC
  Administered 2020-06-20: 25 mL via INTRAVENOUS
  Filled 2020-06-20: qty 50

## 2020-06-20 MED ORDER — MELATONIN 3 MG PO TABS
3.0000 mg | ORAL_TABLET | Freq: Every day | ORAL | Status: DC
  Administered 2020-06-20 – 2020-06-22 (×3): 3 mg via ORAL
  Filled 2020-06-20 (×3): qty 1

## 2020-06-20 MED ORDER — SODIUM CHLORIDE 0.9 % IV SOLN
2.0000 g | INTRAVENOUS | Status: DC
  Administered 2020-06-20 – 2020-06-23 (×4): 2 g via INTRAVENOUS
  Filled 2020-06-20 (×4): qty 20

## 2020-06-20 MED ORDER — POLYETHYLENE GLYCOL 3350 17 G PO PACK
17.0000 g | PACK | Freq: Two times a day (BID) | ORAL | Status: DC
  Administered 2020-06-20 – 2020-06-23 (×4): 17 g via ORAL
  Filled 2020-06-20 (×5): qty 1

## 2020-06-20 MED ORDER — DEXTROSE 50 % IV SOLN: 25.0000 mL | Freq: Once | INTRAVENOUS | Status: AC

## 2020-06-20 MED ORDER — POTASSIUM CHLORIDE 20 MEQ PO PACK
40.0000 meq | PACK | Freq: Once | ORAL | Status: AC
  Administered 2020-06-20: 40 meq via ORAL
  Filled 2020-06-20: qty 2

## 2020-06-20 MED ORDER — MORPHINE SULFATE (PF) 2 MG/ML IV SOLN
2.0000 mg | INTRAVENOUS | Status: DC | PRN
Start: 2020-06-20 — End: 2020-06-23
  Administered 2020-06-20: 2 mg via INTRAVENOUS
  Filled 2020-06-20: qty 1

## 2020-06-20 MED ORDER — ACETAMINOPHEN 325 MG PO TABS
650.0000 mg | ORAL_TABLET | Freq: Four times a day (QID) | ORAL | Status: DC | PRN
  Administered 2020-06-22: 650 mg via ORAL
  Filled 2020-06-20: qty 2

## 2020-06-20 MED ORDER — ISOSORBIDE MONONITRATE ER 30 MG PO TB24
15.0000 mg | ORAL_TABLET | Freq: Every morning | ORAL | Status: DC
  Administered 2020-06-21: 15 mg via ORAL
  Filled 2020-06-20: qty 1

## 2020-06-20 MED ORDER — LEVOTHYROXINE SODIUM 100 MCG PO TABS
100.0000 ug | ORAL_TABLET | Freq: Every morning | ORAL | Status: DC
  Administered 2020-06-21 – 2020-06-23 (×3): 100 ug via ORAL
  Filled 2020-06-20 (×3): qty 1

## 2020-06-20 MED ORDER — DOCUSATE SODIUM 100 MG PO CAPS
100.0000 mg | ORAL_CAPSULE | Freq: Two times a day (BID) | ORAL | Status: DC
  Administered 2020-06-20 – 2020-06-23 (×5): 100 mg via ORAL
  Filled 2020-06-20 (×6): qty 1

## 2020-06-20 MED ORDER — POLYETHYLENE GLYCOL 3350 17 G PO PACK: 17.0000 g | PACK | Freq: Every day | ORAL | Status: DC | PRN

## 2020-06-20 MED ORDER — LACTATED RINGERS IV SOLN: INTRAVENOUS | Status: DC

## 2020-06-20 MED ORDER — APIXABAN 2.5 MG PO TABS
2.5000 mg | ORAL_TABLET | Freq: Two times a day (BID) | ORAL | Status: DC
  Administered 2020-06-20 – 2020-06-23 (×6): 2.5 mg via ORAL
  Filled 2020-06-20 (×6): qty 1

## 2020-06-20 MED ORDER — BOOST / RESOURCE BREEZE PO LIQD CUSTOM
1.0000 | Freq: Two times a day (BID) | ORAL | Status: DC
  Administered 2020-06-21 – 2020-06-23 (×5): 1 via ORAL

## 2020-06-20 MED ORDER — PANTOPRAZOLE SODIUM 40 MG PO TBEC
40.0000 mg | DELAYED_RELEASE_TABLET | Freq: Every morning | ORAL | Status: DC
  Administered 2020-06-21 – 2020-06-23 (×3): 40 mg via ORAL
  Filled 2020-06-20 (×3): qty 1

## 2020-06-20 MED ORDER — PAROXETINE HCL 20 MG PO TABS
20.0000 mg | ORAL_TABLET | Freq: Every morning | ORAL | Status: DC
  Administered 2020-06-21 – 2020-06-23 (×3): 20 mg via ORAL
  Filled 2020-06-20 (×3): qty 1

## 2020-06-20 NOTE — H&P (Signed)
History and Physical    [name] [id number] DOB: 04-26-1931 DOA: 06/20/2020  PCP: [name], DO   Patient coming from: Home  I have personally briefly reviewed patient's old medical records in [place]  Chief Complaint: Leg swelling  HPI: [name] is a 87 y.o. female with medical history significant for atrial fibrillation and DVT, coronary artery disease with AICD. Patient presented to the ED with complaints of left leg swelling and pain worsening over the past 2 days.  She reports noticed swelling initially 2 days ago.  She follows with a podiatrist for an ulcer underneath her left big toe, she has not been on antibiotics for it.  She reports intermittent chest pain unrelated to activity started today.  Chest pain is present at rest, described as heaviness, left-sided, nonradiating, and self resolved without intervention.  No associated difficulty breathing, no nausea no vomiting.  She reports compliance with her Eliquis.  Patient also reports right-sided abdominal pain yesterday, that started about 1 hour after eating breakfast at about 10:30 AM.  Patient was admitted in March 2022 for same abdominal pain, CTA and EGD were both unremarkable, normal HIDA scan, GI thought felt pain was intestinal angina, microvascular in etiology.  Patient reports she has had recurrence of the same abdominal pain.  She reports poor oral intake due to abdominal pain and weight loss.  ED Course: Temperature 98.8.  Stable vitals.  WBC 5.2.  Lactic acid 2> 1.2.  Chest x-ray shows chronic interstitial disease.  Left foot x-ray without acute abnormality.  IV ceftriaxone and 500 mill bolus given.  Hospitalist to admit.  Review of Systems: As per HPI all other systems reviewed and negative.  Past Medical History:  Diagnosis Date  . AICD (automatic cardioverter/defibrillator) present   . Allergy    SESONAL  . Anxiety   . ARTHRITIS   . Arthritis   . Atrial fibrillation ([place])    . CAD (coronary artery disease)    a. history of cardiac arrest 1993;  b. s/p LAD/LCX stenting in 2003;  c. 07/2011 Cath: LM nl, LAD patent stent, D1 80ost, LCX patent stent, RCA min irregs;  d. 04/2012 MV: EF 66%, no ishcemia;  e. 12/2013 Echo: Ef 55%, no rwma, Gr 1 DD, triv AI/MR, mildly dil LA;  f. 12/2013 Lexi MV: intermediate risk - apical ischemia and inf/infsept fixed defect, ? artifactual.  . CAD in native artery 12/31/2013   Previous stents to LAD and Ramus patent on cath today 12/31/13 also There is severe disease in the ostial first diagonal which is unchanged from most recent cardiac catheterization. The right coronary artery could not be engaged selectively but nonselective angiography showed no significant disease in the proximal and midsegment.     . Cataract    DENIES  . Chronic back pain   . Chronic sinus bradycardia   . CKD (chronic kidney disease), stage III ([place])   . Clotting disorder (H[facility])    DVT  . COLITIS 12/02/2007   Qualifier: Diagnosis of  By: Nils Pyle CMA ([place]), [name]    . Diabetes mellitus without complication ([place])    DENIES  . Diverticulosis of colon (without mention of hemorrhage) 2007   Colonoscopy   . Esophageal stricture    a. 2012 s/p dil.  . Esophagitis, unspecified    a. 2012 EGD  . EXTERNAL HEMORRHOIDS   . GERD (gastroesophageal reflux disease)    omeprazole  . H/O hiatal hernia   . Hiatal hernia  a. 2012 EGD.  Marland Kitchen History of DVT in the past, not on Coumadin now    left  leg  . HYPERCHOLESTEROLEMIA   . Hypertension   . ICD (implantable cardiac defibrillator) in place    a. s/p initial ICD in 1993 in setting of cardiac arrest;  b. 01/2007 gen change: Guidant T135 Vitality DS VR single lead ICD.  . Macular degeneration    gets injecton in eye every 5 weeks- last injection - 05/03/2013   . Myocardial infarction ([place]) 1993  . Near syncope 05/22/2015  . Nephrolithiasis, just saw Dr [name]   . Orthostatic hypotension   . Peripheral vascular  disease ([place])    ???  . RECTAL BLEEDING 12/03/2007   Qualifier: Diagnosis of  By: Sharlett Iles MD Byrd Hesselbach   . Unspecified gastritis and gastroduodenitis without mention of hemorrhage    a. 2003 EGD->not noted on 2012 EGD.  Marland Kitchen Unstable angina, neg MI, cath stable.maybe GI 12/30/2013    Past Surgical History:  Procedure Laterality Date  . BREAST BIOPSY Left 11/2013  . CARDIAC CATHETERIZATION    . CARDIAC CATHETERIZATION  01/01/15   patent stents -there is severe disease in ostial 1st diag but without change.   Marland Kitchen CARDIAC DEFIBRILLATOR PLACEMENT  02/05/2007   Guidant  . CARDIOVASCULAR STRESS TEST  12/30/2013   abnormal  . CARDIOVERSION N/A 02/13/2019   Procedure: CARDIOVERSION;  Surgeon: Sanda Klein, MD;  Location: [facility];  Service: Cardiovascular;  Laterality: N/A;  . CATARACT EXTRACTION Right    Dr. Katy Fitch  . COLONOSCOPY    . COLONOSCOPY WITH PROPOFOL N/A 03/30/2020   Procedure: COLONOSCOPY WITH PROPOFOL;  Surgeon: Harvel Quale, MD;  [facility];  Service: Gastroenterology;  Laterality: N/A;  . coronary stents     . CYSTOSCOPY WITH URETEROSCOPY Right 05/20/2013   Procedure: CYSTOSCOPY, RIGHT URETEROSCOPY STONE EXTRACTION, Insertion of right DOUBLE J STENT ;  Surgeon: Irine Seal, MD;  Location: [facility];  Service: Urology;  Laterality: Right;  . CYSTOSCOPY WITH URETEROSCOPY AND STENT PLACEMENT N/A 06/03/2013   Procedure: SECOND LOOK CYSTOSCOPY WITH URETEROSCOPY  HOLMIUM LASER LITHO AND STONE EXTRACTION Sammie Bench ;  Surgeon: Malka So, MD;  Location: [facility];  Service: Urology;  Laterality: N/A;  . ESOPHAGOGASTRODUODENOSCOPY (EGD) WITH PROPOFOL N/A 11/20/2019   Procedure: ESOPHAGOGASTRODUODENOSCOPY (EGD) WITH PROPOFOL;  Surgeon: Daneil Dolin, MD;  [facility];  Service: Endoscopy;  Laterality: N/A;  . GIVENS CAPSULE STUDY N/A 10/30/2016   Procedure: GIVENS CAPSULE STUDY;  Surgeon: Irene Shipper, MD;  Location: [facility];  Service: Endoscopy;   Laterality: N/A;  NEEDS TO BE ADMITTED FOR OBSERVATION PT. HAS DEFIB  . HEMORROIDECTOMY  80's  . HOLMIUM LASER APPLICATION Right [id number]   Procedure: HOLMIUM LASER APPLICATION;  Surgeon: Irine Seal, MD;  Location: [facility];  Service: Urology;  Laterality: Right;  . HYSTEROSCOPY WITH D & C  09/02/2010   Procedure: DILATATION AND CURETTAGE (D&C) /HYSTEROSCOPY;  Surgeon: Margarette Asal;  Location: [facility];  Service: Gynecology;  Laterality: N/A;  Dilation and Curettage with Hysteroscopy and Polypectomy  . IMPLANTABLE CARDIOVERTER DEFIBRILLATOR (ICD) GENERATOR CHANGE N/A 02/10/2014   Procedure: ICD GENERATOR CHANGE;  Surgeon: Sanda Klein, MD;  Location: [facility];  Service: Cardiovascular;  Laterality: N/A;  . LEFT HEART CATHETERIZATION WITH CORONARY ANGIOGRAM N/A 07/28/2011   Procedure: LEFT HEART CATHETERIZATION WITH CORONARY ANGIOGRAM;  Surgeon: Lorretta Harp, MD;  Location: [facility];  Service: Cardiovascular;  Laterality:  N/A;  . LEFT HEART CATHETERIZATION WITH CORONARY ANGIOGRAM N/A 12/31/2013   Procedure: LEFT HEART CATHETERIZATION WITH CORONARY ANGIOGRAM;  Surgeon: Wellington Hampshire, MD;  Location: [facility];  Service: Cardiovascular;  Laterality: N/A;     reports that she has never smoked. She has never used smokeless tobacco. She reports that she does not drink alcohol and does not use drugs.  Allergies  Allergen Reactions  . Sotalol Other (See Comments)    Torsades   . Tramadol Other (See Comments)    Hallucination, bad-dream  . Actos [Pioglitazone] Swelling  . Adhesive [Tape] Other (See Comments)    Causes sores  . Atorvastatin Other (See Comments)    myalgia  . Bactrim [Sulfamethoxazole-Trimethoprim] Itching and Rash    Itchy rash  . Ciprofloxacin Nausea Only  . Contrast Media [Iodinated Diagnostic Agents] Other (See Comments)    Headache (no action/pre-med required)  . Latex Rash  . Pedi-Pre Tape Spray [Wound Dressing Adhesive] Other (See Comments)    Causes  sores  . Sulfa Antibiotics Other (See Comments)    Causes sores    Family History  Problem Relation Age of Onset  . Stroke Mother   . Other Mother        brain tumor  . Other Father        MI  . Heart attack Father   . Stroke Sister   . Macular degeneration Sister   . Diabetes Daughter   . Cancer Daughter        ovarian  . Colon polyps Daughter   . Atrial fibrillation Sister   . Hyperlipidemia Sister   . Osteoporosis Sister   . Stroke Sister   . Uterine cancer Sister   . Heart attack Brother   . Heart disease Brother   . Asthma Brother   . Breast cancer Neg Hx     Prior to Admission medications   Medication Sig Start Date End Date Taking? Authorizing Provider  acetaminophen (TYLENOL) 500 MG tablet Take 1 tablet (500 mg total) by mouth every 6 (six) hours as needed for mild pain, fever or headache. 04/05/20  Yes Johnson, Clanford L, MD  apixaban (ELIQUIS) 2.5 MG TABS tablet Take 1 tablet (2.5 mg total) by mouth 2 (two) times daily. 02/11/20  Yes Croitoru, Mihai, MD  carboxymethylcellulose (REFRESH PLUS) 0.5 % SOLN Place 1 drop into both eyes 3 (three) times daily as needed (dry/irritated eyes.).   Yes [provider]  Cholecalciferol (VITAMIN D) 50 MCG (2000 UT) CAPS Take 2,000 Units by mouth every morning.   Yes [provider]  docusate sodium (COLACE) 100 MG capsule Take 100 mg by mouth 2 (two) times daily.   Yes [provider]  feeding supplement (ENSURE ENLIVE / ENSURE PLUS) LIQD Take 237 mLs by mouth 2 (two) times daily between meals. 12/26/19  Yes Mikhail, Clinical biochemist, DO  HYDROcodone-acetaminophen (NORCO/VICODIN) 5-325 MG tablet TAKE 1 TABLET EVERY 4 HOURS AS NEEDED FOR MODERATE PAIN Patient taking differently: Take 1 tablet by mouth every 4 (four) hours as needed for moderate pain. 05/11/20  Yes McKenzie, Candee Furbish, MD  isosorbide mononitrate (IMDUR) 30 MG 24 hr tablet TAKE ONE HALF (1/2) TABLET DAILY Patient taking differently: Take 15 mg by  mouth every morning. 05/17/20  Yes Croitoru, Mihai, MD  levothyroxine (SYNTHROID) 100 MCG tablet Take 1 tablet (100 mcg total) by mouth daily. Patient taking differently: Take 100 mcg by mouth every morning. 01/27/20  Yes Croitoru, Dani Gobble, MD  loratadine (CLARITIN) 10  MG tablet Take 1 tablet (10 mg total) by mouth daily as needed for allergies, rhinitis or itching. (for itching) 04/05/20  Yes Johnson, Clanford L, MD  MELATONIN PO Take 3 mg by mouth at bedtime.   Yes [provider]  metoprolol tartrate (LOPRESSOR) 25 MG tablet Take 0.5 tablets (12.5 mg total) by mouth 2 (two) times daily. HOLD FOR SBP<95 OR HR<65 04/05/20  Yes Johnson, Clanford L, MD  Multiple Vitamins-Minerals (CENTRUM SILVER ULTRA WOMENS PO) Take 1 tablet by mouth every morning.   Yes [provider]  nitroGLYCERIN (NITROSTAT) 0.4 MG SL tablet Place 1 tablet (0.4 mg total) under the tongue every 5 (five) minutes as needed for chest pain. 12/20/18  Yes Claretta Fraise, MD  pantoprazole (PROTONIX) 40 MG tablet Take 1 tablet (40 mg total) by mouth 2 (two) times daily before a meal. Patient taking differently: Take 40 mg by mouth every morning. 04/05/20  Yes Johnson, Clanford L, MD  PARoxetine (PAXIL) 20 MG tablet TAKE (1) TABLET DAILY IN THE MORNING. Patient taking differently: Take 20 mg by mouth every morning. 03/29/20  Yes Gottschalk, Leatrice Jewels M, DO  rosuvastatin (CRESTOR) 10 MG tablet Take 1 tablet (10 mg total) by mouth daily. Patient taking differently: Take 10 mg by mouth at bedtime. 02/24/20  Yes Ronnie Doss M, DO  amiodarone (PACERONE) 200 MG tablet Take 200 mg by mouth daily. Patient not taking: Reported on 06/20/2020 06/16/20   [provider]    Physical Exam: Vitals:   06/20/20 1730 06/20/20 1800 06/20/20 1830 06/20/20 1900  BP: (!) 182/77 (!) 170/64 (!) 171/69 (!) 164/71  Pulse: (!) 55 (!) 56 (!) 55 (!) 58  Resp: '16 16 18 18  '$ Temp:      TempSrc:      SpO2: 100% 100% 100% 100%  Weight:       Height:        Constitutional: NAD, calm, comfortable Vitals:   06/20/20 1730 06/20/20 1800 06/20/20 1830 06/20/20 1900  BP: (!) 182/77 (!) 170/64 (!) 171/69 (!) 164/71  Pulse: (!) 55 (!) 56 (!) 55 (!) 58  Resp: '16 16 18 18  '$ Temp:      TempSrc:      SpO2: 100% 100% 100% 100%  Weight:      Height:       Eyes: PERRL, lids and conjunctivae normal ENMT: Mucous membranes are moist.  Neck: normal, supple, no masses, no thyromegaly Respiratory: clear to auscultation bilaterally, no wheezing, no crackles. Normal respiratory effort. No accessory muscle use.  Cardiovascular: Regular rate and rhythm, no murmurs / rubs / gallops. + 1 Pitting edema left LE only/.  2+ pedal pulses. AICD right upper chest. Abdomen: no tenderness, no masses palpated. No hepatosplenomegaly. Bowel sounds positive.  Musculoskeletal: no clubbing / cyanosis. No joint deformity upper and lower extremities. Good ROM, no contractures. Normal muscle tone.  Skin:, Erythema, tenderness, swelling extending from feet to mid leg.  Small superficial- ~ 1 inch in diameter,grade 2 ulcer to sole of big toe.  No appreciable discharge. No induration Neurologic: No apparent cranial abnormality, moving all extremities spontaneously. Psychiatric: Normal judgment and insight. Alert and oriented x 3. Normal mood.         Labs on Admission: I have personally reviewed following labs and imaging studies  CBC: Recent Labs  Lab 06/20/20 1730  WBC 5.2  NEUTROABS 3.2  HGB 11.1*  HCT 34.4*  MCV 95.8  PLT [id number]   Basic Metabolic Panel: Recent Labs  Lab  06/20/20 1730  NA 132*  K 3.6  CL 100  CO2 25  GLUCOSE 204*  BUN 16  CREATININE 1.08*  CALCIUM 8.4*   Liver Function Tests: Recent Labs  Lab 06/20/20 1730  AST 19  ALT 13  ALKPHOS 73  BILITOT 0.6  PROT 6.5  ALBUMIN 3.0*   Coagulation Profile: Recent Labs  Lab 06/20/20 1730  INR 1.2     Radiological Exams on Admission: DG Chest Port 1 View  Result Date:  06/20/2020 CLINICAL DATA:  Questionable sepsis. Nausea. Lower extremity swelling. EXAM: PORTABLE CHEST 1 VIEW COMPARISON:  Chest x-ray dated 12/22/2019. FINDINGS: Heart size and mediastinal contours are stable. LEFT chest wall pacemaker/ICD apparatus in place. Coarse lung markings bilaterally, grossly stable. No confluent opacity to suggest a developing pneumonia. No pleural effusion or pneumothorax is seen. Osseous structures about the chest are unremarkable. IMPRESSION: 1. No active disease. No evidence of pneumonia or pulmonary edema. 2. Chronic interstitial lung disease. Electronically Signed   By: Franki Cabot M.D.   On: 06/20/2020 17:57   DG Foot 2 Views Left  Result Date: 06/20/2020 CLINICAL DATA:  LEFT lower extremity swelling. EXAM: LEFT FOOT - 2 VIEW COMPARISON:  None. FINDINGS: Osseous alignment is normal. No fracture line or displaced fracture fragment. No acute-appearing cortical irregularity or osseous lesion. Mild degenerative spurring within the midfoot and hindfoot. Soft tissues about the LEFT foot are unremarkable. IMPRESSION: 1. No acute findings. 2. Mild degenerative spurring within the midfoot and hindfoot. Electronically Signed   By: Franki Cabot M.D.   On: 06/20/2020 17:59    EKG: Independently reviewed.  Sinus bradycardia rate 55, old LBBB.  QTc prolonged 523.  No significant change from prior.  Assessment/Plan Principal Problem:   Cellulitis of left leg Active Problems:   Chronic sinus bradycardia   ICD (implantable cardioverter-defibrillator) in place   History of DVT in the past, on eliquis now   Paroxysmal atrial fibrillation, chads2 Vasc2 score of 5, on eliquis   CAD (coronary artery disease)  Left Leg cellulitis-likely secondary to ulcer on left foot, rules out for sepsis, heart rate 50s, WBC 5.2.  Lactic acid 2> 1.2.  X-ray of the foot -without acute abnormality.  Follows with podiatry.  DP pulses present. -Continue IV ceftriaxone 1 g daily started in ED -Follow-up  blood and urine cultures ordered in ED -BMP, CBC in the morning -500 mill bolus given -Venous Dopplers LLE -IV morphine 2 mg as needed  History of DVT-on Eliquis reports compliance  - resume  Atrial fibrillation-rate controlled- heart rate in the 50s and on anticoagulation with Eliquis. -Resume Eliquis, metoprolol  Chest pain with coronary artery disease-atypical, but history of coronary artery disease.  Stent placement in 1993.  EKG without acute abnormalities, shows old LBBB. -Trend troponin -EKG in the morning.  Ischemic heart disease, AICD status-last echo 11/2019 EF 50 to [id number], grade 1 diastolic dysfunction.  Left stress test 2014 low risk.  Prolonged QTC-523, chronic.  Patient was previously on amiodarone, during last hospitalization this was held due to bradycardia.   Potassium 3.6. She is on paroxetine -Replete K -Check magnesium  Diabetes mellitus random glucose 204 > 57.  Reports chronic poor oral intake due to abdominal pain.  Not on antihypoglycemic medications. -Daily fasting CBGs - 66ms d50 x 1   DVT prophylaxis: Eliquis Code Status: DNR, universal DNR form at bedside. Family Communication: None at bedside. Disposition Plan: ~ 2 days Consults called: none Admission status:  Obs tele  Bethena Roys MD [facility]  06/20/2020, 9:08 PM

## 2020-06-20 NOTE — ED Triage Notes (Signed)
Pt c/o left leg swelling since Friday. Pt also c/o having some nausea and just not feeling well.   Pt states she has been seeing a foot specialist for a sore on her left foot.

## 2020-06-20 NOTE — Progress Notes (Signed)
elink following sepsis 

## 2020-06-20 NOTE — ED Provider Notes (Signed)
[facility] EMERGENCY DEPARTMENT Provider Note   CSN: [id number] Arrival date & time: 06/20/20  1634     History Chief Complaint  Patient presents with  . Leg Swelling    [name] is a 87 y.o. female.  Patient has been complaining of pain in her left large toe and now she has redness to the left toe left foot and upper left leg that is painful.  Patient not having any fevers chills  The history is provided by the patient and medical records. No language interpreter was used.  Rash Location: Left leg. Severity:  Moderate Onset quality:  Sudden Timing:  Constant Progression:  Worsening Chronicity:  New Context: not animal contact   Associated symptoms: no abdominal pain, no diarrhea, no fatigue and no headaches        Past Medical History:  Diagnosis Date  . AICD (automatic cardioverter/defibrillator) present   . Allergy    SESONAL  . Anxiety   . ARTHRITIS   . Arthritis   . Atrial fibrillation ([place])   . CAD (coronary artery disease)    a. history of cardiac arrest 1993;  b. s/p LAD/LCX stenting in 2003;  c. 07/2011 Cath: LM nl, LAD patent stent, D1 80ost, LCX patent stent, RCA min irregs;  d. 04/2012 MV: EF 66%, no ishcemia;  e. 12/2013 Echo: Ef 55%, no rwma, Gr 1 DD, triv AI/MR, mildly dil LA;  f. 12/2013 Lexi MV: intermediate risk - apical ischemia and inf/infsept fixed defect, ? artifactual.  . CAD in native artery 12/31/2013   Previous stents to LAD and Ramus patent on cath today 12/31/13 also There is severe disease in the ostial first diagonal which is unchanged from most recent cardiac catheterization. The right coronary artery could not be engaged selectively but nonselective angiography showed no significant disease in the proximal and midsegment.     . Cataract    DENIES  . Chronic back pain   . Chronic sinus bradycardia   . CKD (chronic kidney disease), stage III ([place])   . Clotting disorder (HCC)    DVT  . COLITIS 12/02/2007   Qualifier: Diagnosis  of  By: Nils Pyle CMA ([place]), [name]    . Diabetes mellitus without complication ([place])    DENIES  . Diverticulosis of colon (without mention of hemorrhage) 2007   Colonoscopy   . Esophageal stricture    a. 2012 s/p dil.  . Esophagitis, unspecified    a. 2012 EGD  . EXTERNAL HEMORRHOIDS   . GERD (gastroesophageal reflux disease)    omeprazole  . H/O hiatal hernia   . Hiatal hernia    a. 2012 EGD.  Marland Kitchen History of DVT in the past, not on Coumadin now    left  leg  . HYPERCHOLESTEROLEMIA   . Hypertension   . ICD (implantable cardiac defibrillator) in place    a. s/p initial ICD in 1993 in setting of cardiac arrest;  b. 01/2007 gen change: Guidant T135 Vitality DS VR single lead ICD.  . Macular degeneration    gets injecton in eye every 5 weeks- last injection - 05/03/2013   . Myocardial infarction ([place]) 1993  . Near syncope 05/22/2015  . Nephrolithiasis, just saw Dr [name]   . Orthostatic hypotension   . Peripheral vascular disease ([place])    ???  . RECTAL BLEEDING 12/03/2007   Qualifier: Diagnosis of  By: Sharlett Iles MD Byrd Hesselbach   . Unspecified gastritis and gastroduodenitis without mention of hemorrhage  a. 2003 EGD->not noted on 2012 EGD.  Marland Kitchen Unstable angina, neg MI, cath stable.maybe GI 12/30/2013    Patient Active Problem List   Diagnosis Date Noted  . Loss of weight 05/18/2020  . 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 Intractable abdominal pain 03/28/2020  . Lactic acidosis 03/28/2020  . Elevated troponin I level 03/28/2020  . Malnutrition of moderate degree 12/26/2019  . Prolonged QT interval 12/23/2019  . Acute lower UTI 12/23/2019  . Constipation 11/27/2019  . Diarrhea 11/27/2019  . Lesion of pancreas 11/26/2019  . Fibromuscular dysplasia of right renal artery ([place]) 11/26/2019  . Left renal artery stenosis ([place]) 11/26/2019  . RUQ pain   . Abnormal CT of the abdomen   . Vertigo 11/15/2019  . Advanced care planning/counseling discussion 11/15/2019  . CAD (coronary  artery disease)   . Hypothyroidism   . Hyponatremia   . Volume depletion   . TSH elevation 08/01/2019  . Hospital discharge follow-up 08/01/2019  . Exudative age-related macular degeneration of right eye with inactive choroidal neovascularization ([place]) 04/30/2019  . Retinal hemorrhage of right eye 04/30/2019  . Advanced nonexudative age-related macular degeneration of left eye with subfoveal involvement 04/30/2019  . Exudative age-related macular degeneration of left eye with inactive choroidal neovascularization ([place]) 04/30/2019  . Advanced nonexudative age-related macular degeneration of right eye with subfoveal involvement 04/30/2019  . Posterior vitreous detachment of both eyes 04/30/2019  . Secondary hypercoagulable state ([place]) 03/11/2019  . Persistent atrial fibrillation ([place])   . Proctitis 01/09/2019  . Atrial fibrillation, chronic ([place]) 01/09/2019  . Hyperlipidemia 01/09/2019  . Nausea 01/09/2019  . Hyperglycemia 01/09/2019  . COVID-19 virus infection 12/28/2018  . Chronic diarrhea 09/18/2018  . Abdominal pain 09/18/2018  . Osteoarthritis of right knee 03/29/2018  . GERD (gastroesophageal reflux disease)   . Dehydration 02/06/2018  . Hypokalemia 02/06/2018  . Diarrhea of presumed infectious origin 02/06/2018  . Acute kidney injury superimposed on CKD ([place]) 02/05/2018  . Essential hypertension 12/21/2017  . Degenerative scoliosis 02/19/2017  . Burst fracture of lumbar vertebra ([place]) 02/19/2017  . History of drug-induced prolonged QT interval with torsade de pointes 11/01/2016  . Iron deficiency anemia 10/30/2016  . Long term current use of anticoagulant 02/17/2016  . Near syncope 05/22/2015  . Diabetes mellitus type 2, diet-controlled ([place]) 09/14/2014  . ICD (implantable cardioverter-defibrillator) battery depletion 01/20/2014  . Abnormal nuclear stress test, 12/30/13 12/31/2013  . Coronary artery disease involving native coronary artery of native heart without angina  pectoris 12/31/2013  . Paroxysmal atrial fibrillation, chads2 Vasc2 score of 5, on eliquis 10/16/2013  . Catecholaminergic polymorphic ventricular tachycardia ([place]) 10/16/2013  . Ureteral stone with hydronephrosis 05/20/2013  . Metabolic syndrome [id number]  . Orthostatic hypotension 07/27/2011  . Chest pain 07/25/2011  . Weakness 07/25/2011    Class: Acute  . Chronic sinus bradycardia 07/25/2011  . ICD (implantable cardioverter-defibrillator) in place 07/25/2011  . CKD (chronic kidney disease), stage III ([place]) 07/25/2011    Class: Chronic  . History of DVT in the past, on eliquis now 07/25/2011    Class: History of  . Nephrolithiasis, just saw Dr [name] 07/25/2011    Class: History of  . DYSPHAGIA 03/24/2010  . Chronic ischemic heart disease 12/03/2007  . Irritable bowel syndrome with diarrhea 12/03/2007  . EPIGASTRIC PAIN 12/03/2007  . Hyperlipidemia associated with type 2 diabetes mellitus ([place]) 12/02/2007    Class: History of  . EXTERNAL HEMORRHOIDS 12/02/2007    Class: History of  . ESOPHAGITIS 12/02/2007  . GASTRITIS  12/02/2007  . DIVERTICULOSIS, COLON 12/02/2007  . ARTHRITIS 12/02/2007    Class: Chronic    Past Surgical History:  Procedure Laterality Date  . BREAST BIOPSY Left 11/2013  . CARDIAC CATHETERIZATION    . CARDIAC CATHETERIZATION  01/01/15   patent stents -there is severe disease in ostial 1st diag but without change.   Marland Kitchen CARDIAC DEFIBRILLATOR PLACEMENT  02/05/2007   Guidant  . CARDIOVASCULAR STRESS TEST  12/30/2013   abnormal  . CARDIOVERSION N/A 02/13/2019   Procedure: CARDIOVERSION;  Surgeon: Sanda Klein, MD;  Location: [facility];  Service: Cardiovascular;  Laterality: N/A;  . CATARACT EXTRACTION Right    Dr. Katy Fitch  . COLONOSCOPY    . COLONOSCOPY WITH PROPOFOL N/A 03/30/2020   Procedure: COLONOSCOPY WITH PROPOFOL;  Surgeon: Harvel Quale, MD;  [facility];  Service: Gastroenterology;  Laterality: N/A;  . coronary  stents     . CYSTOSCOPY WITH URETEROSCOPY Right 05/20/2013   Procedure: CYSTOSCOPY, RIGHT URETEROSCOPY STONE EXTRACTION, Insertion of right DOUBLE J STENT ;  Surgeon: Irine Seal, MD;  Location: [facility];  Service: Urology;  Laterality: Right;  . CYSTOSCOPY WITH URETEROSCOPY AND STENT PLACEMENT N/A 06/03/2013   Procedure: SECOND LOOK CYSTOSCOPY WITH URETEROSCOPY  HOLMIUM LASER LITHO AND STONE EXTRACTION Sammie Bench ;  Surgeon: Malka So, MD;  Location: [facility];  Service: Urology;  Laterality: N/A;  . ESOPHAGOGASTRODUODENOSCOPY (EGD) WITH PROPOFOL N/A 11/20/2019   Procedure: ESOPHAGOGASTRODUODENOSCOPY (EGD) WITH PROPOFOL;  Surgeon: Daneil Dolin, MD;  [facility];  Service: Endoscopy;  Laterality: N/A;  . GIVENS CAPSULE STUDY N/A 10/30/2016   Procedure: GIVENS CAPSULE STUDY;  Surgeon: Irene Shipper, MD;  Location: [facility];  Service: Endoscopy;  Laterality: N/A;  NEEDS TO BE ADMITTED FOR OBSERVATION PT. HAS DEFIB  . HEMORROIDECTOMY  80's  . HOLMIUM LASER APPLICATION Right [id number]   Procedure: HOLMIUM LASER APPLICATION;  Surgeon: Irine Seal, MD;  Location: [facility];  Service: Urology;  Laterality: Right;  . HYSTEROSCOPY WITH D & C  09/02/2010   Procedure: DILATATION AND CURETTAGE (D&C) /HYSTEROSCOPY;  Surgeon: Margarette Asal;  Location: [facility];  Service: Gynecology;  Laterality: N/A;  Dilation and Curettage with Hysteroscopy and Polypectomy  . IMPLANTABLE CARDIOVERTER DEFIBRILLATOR (ICD) GENERATOR CHANGE N/A 02/10/2014   Procedure: ICD GENERATOR CHANGE;  Surgeon: Sanda Klein, MD;  Location: [facility] CATH LAB;  Service: Cardiovascular;  Laterality: N/A;  . LEFT HEART CATHETERIZATION WITH CORONARY ANGIOGRAM N/A 07/28/2011   Procedure: LEFT HEART CATHETERIZATION WITH CORONARY ANGIOGRAM;  Surgeon: Lorretta Harp, MD;  Location: [facility] CATH LAB;  Service: Cardiovascular;  Laterality: N/A;  . LEFT HEART CATHETERIZATION WITH CORONARY ANGIOGRAM N/A 12/31/2013   Procedure: LEFT HEART CATHETERIZATION WITH  CORONARY ANGIOGRAM;  Surgeon: Wellington Hampshire, MD;  Location: [facility] CATH LAB;  Service: Cardiovascular;  Laterality: N/A;     OB History   No obstetric history on file.     Family History  Problem Relation Age of Onset  . Stroke Mother   . Other Mother        brain tumor  . Other Father        MI  . Heart attack Father   . Stroke Sister   . Macular degeneration Sister   . Diabetes Daughter   . Cancer Daughter        ovarian  . Colon polyps Daughter   . Atrial fibrillation Sister   . Hyperlipidemia Sister   . Osteoporosis Sister   . Stroke  Sister   . Uterine cancer Sister   . Heart attack Brother   . Heart disease Brother   . Asthma Brother   . Breast cancer Neg Hx     Social History   Tobacco Use  . Smoking status: Never Smoker  . Smokeless tobacco: Never Used  Vaping Use  . Vaping Use: Never used  Substance Use Topics  . Alcohol use: No  . Drug use: No    Home Medications Prior to Admission medications   Medication Sig Start Date End Date Taking? Authorizing Provider  acetaminophen (TYLENOL) 500 MG tablet Take 1 tablet (500 mg total) by mouth every 6 (six) hours as needed for mild pain, fever or headache. 04/05/20  Yes Johnson, Clanford L, MD  apixaban (ELIQUIS) 2.5 MG TABS tablet Take 1 tablet (2.5 mg total) by mouth 2 (two) times daily. 02/11/20  Yes Croitoru, Mihai, MD  carboxymethylcellulose (REFRESH PLUS) 0.5 % SOLN Place 1 drop into both eyes 3 (three) times daily as needed (dry/irritated eyes.).   Yes [provider]  Cholecalciferol (VITAMIN D) 50 MCG (2000 UT) CAPS Take 2,000 Units by mouth every morning.   Yes [provider]  docusate sodium (COLACE) 100 MG capsule Take 100 mg by mouth 2 (two) times daily.   Yes [provider]  feeding supplement (ENSURE ENLIVE / ENSURE PLUS) LIQD Take 237 mLs by mouth 2 (two) times daily between meals. 12/26/19  Yes Mikhail, Clinical biochemist, DO  HYDROcodone-acetaminophen (NORCO/VICODIN) 5-325 MG  tablet TAKE 1 TABLET EVERY 4 HOURS AS NEEDED FOR MODERATE PAIN Patient taking differently: Take 1 tablet by mouth every 4 (four) hours as needed for moderate pain. 05/11/20  Yes McKenzie, Candee Furbish, MD  isosorbide mononitrate (IMDUR) 30 MG 24 hr tablet TAKE ONE HALF (1/2) TABLET DAILY Patient taking differently: Take 15 mg by mouth every morning. 05/17/20  Yes Croitoru, Mihai, MD  levothyroxine (SYNTHROID) 100 MCG tablet Take 1 tablet (100 mcg total) by mouth daily. Patient taking differently: Take 100 mcg by mouth every morning. 01/27/20  Yes Croitoru, Mihai, MD  loratadine (CLARITIN) 10 MG tablet Take 1 tablet (10 mg total) by mouth daily as needed for allergies, rhinitis or itching. (for itching) 04/05/20  Yes Johnson, Clanford L, MD  MELATONIN PO Take 3 mg by mouth at bedtime.   Yes [provider]  metoprolol tartrate (LOPRESSOR) 25 MG tablet Take 0.5 tablets (12.5 mg total) by mouth 2 (two) times daily. HOLD FOR SBP<95 OR HR<65 04/05/20  Yes Johnson, Clanford L, MD  Multiple Vitamins-Minerals (CENTRUM SILVER ULTRA WOMENS PO) Take 1 tablet by mouth every morning.   Yes [provider]  nitroGLYCERIN (NITROSTAT) 0.4 MG SL tablet Place 1 tablet (0.4 mg total) under the tongue every 5 (five) minutes as needed for chest pain. 12/20/18  Yes Claretta Fraise, MD  pantoprazole (PROTONIX) 40 MG tablet Take 1 tablet (40 mg total) by mouth 2 (two) times daily before a meal. Patient taking differently: Take 40 mg by mouth every morning. 04/05/20  Yes Johnson, Clanford L, MD  PARoxetine (PAXIL) 20 MG tablet TAKE (1) TABLET DAILY IN THE MORNING. Patient taking differently: Take 20 mg by mouth every morning. 03/29/20  Yes Gottschalk, Leatrice Jewels M, DO  rosuvastatin (CRESTOR) 10 MG tablet Take 1 tablet (10 mg total) by mouth daily. Patient taking differently: Take 10 mg by mouth at bedtime. 02/24/20  Yes Ronnie Doss M, DO  amiodarone (PACERONE) 200 MG tablet Take 200 mg by mouth daily. Patient not  taking: Reported on 06/20/2020 06/16/20   [provider]    Allergies    Sotalol, Tramadol, Actos [pioglitazone], Adhesive [tape], Atorvastatin, Bactrim [sulfamethoxazole-trimethoprim], Ciprofloxacin, Contrast media [iodinated diagnostic agents], Latex, Pedi-pre tape spray [wound dressing adhesive], and Sulfa antibiotics  Review of Systems   Review of Systems  Constitutional: Negative for appetite change and fatigue.  HENT: Negative for congestion, ear discharge and sinus pressure.   Eyes: Negative for discharge.  Respiratory: Negative for cough.   Cardiovascular: Negative for chest pain.  Gastrointestinal: Negative for abdominal pain and diarrhea.  Genitourinary: Negative for frequency and hematuria.  Musculoskeletal: Negative for back pain.  Skin: Positive for rash.  Neurological: Negative for seizures and headaches.  Psychiatric/Behavioral: Negative for hallucinations.    Physical Exam Updated Vital Signs BP (!) 164/71   Pulse (!) 58   Temp 98.8 F (37.1 C) (Oral)   Resp 18   Ht '5\' 3"'$  (1.6 m)   Wt 54.4 kg   SpO2 100%   BMI 21.26 kg/m   Physical Exam Vitals and nursing note reviewed.  Constitutional:      Appearance: She is well-developed.  HENT:     Head: Normocephalic.     Mouth/Throat:     Mouth: Mucous membranes are moist.  Eyes:     General: No scleral icterus.    Conjunctiva/sclera: Conjunctivae normal.  Neck:     Thyroid: No thyromegaly.  Cardiovascular:     Rate and Rhythm: Normal rate and regular rhythm.     Heart sounds: No murmur heard. No friction rub. No gallop.   Pulmonary:     Breath sounds: No stridor. No wheezing or rales.  Chest:     Chest wall: No tenderness.  Abdominal:     General: There is no distension.     Tenderness: There is no abdominal tenderness. There is no rebound.  Musculoskeletal:        General: Normal range of motion.     Cervical back: Neck supple.     Comments: Patient has cellulitis of the left foot and left  lower leg.  It appears to have started in her left great toe  Lymphadenopathy:     Cervical: No cervical adenopathy.  Skin:    Findings: No erythema or rash.  Neurological:     Mental Status: She is alert and oriented to person, place, and time.     Motor: No abnormal muscle tone.     Coordination: Coordination normal.  Psychiatric:        Behavior: Behavior normal.     ED Results / Procedures / Treatments   Labs (all labs ordered are listed, but only abnormal results are displayed) Labs Reviewed  LACTIC ACID, PLASMA - Abnormal; Notable for the following components:      Result Value   Lactic Acid, Venous 2.0 (*)    All other components within normal limits  COMPREHENSIVE METABOLIC PANEL - Abnormal; Notable for the following components:   Sodium 132 (*)    Glucose, Bld 204 (*)    Creatinine, Ser 1.08 (*)    Calcium 8.4 (*)    Albumin 3.0 (*)    GFR, Estimated 50 (*)    All other components within normal limits  CBC WITH DIFFERENTIAL/PLATELET - Abnormal; Notable for the following components:   RBC 3.59 (*)    Hemoglobin 11.1 (*)    HCT 34.4 (*)    All other components within normal limits  CULTURE, BLOOD (ROUTINE X 2)  CULTURE, BLOOD (ROUTINE  X 2)  URINE CULTURE  LACTIC ACID, PLASMA  PROTIME-INR  APTT  URINALYSIS, ROUTINE W REFLEX MICROSCOPIC    EKG None  Radiology DG Chest Port 1 View  Result Date: 06/20/2020 CLINICAL DATA:  Questionable sepsis. Nausea. Lower extremity swelling. EXAM: PORTABLE CHEST 1 VIEW COMPARISON:  Chest x-ray dated 12/22/2019. FINDINGS: Heart size and mediastinal contours are stable. LEFT chest wall pacemaker/ICD apparatus in place. Coarse lung markings bilaterally, grossly stable. No confluent opacity to suggest a developing pneumonia. No pleural effusion or pneumothorax is seen. Osseous structures about the chest are unremarkable. IMPRESSION: 1. No active disease. No evidence of pneumonia or pulmonary edema. 2. Chronic interstitial lung  disease. Electronically Signed   By: Franki Cabot M.D.   On: 06/20/2020 17:57   DG Foot 2 Views Left  Result Date: 06/20/2020 CLINICAL DATA:  LEFT lower extremity swelling. EXAM: LEFT FOOT - 2 VIEW COMPARISON:  None. FINDINGS: Osseous alignment is normal. No fracture line or displaced fracture fragment. No acute-appearing cortical irregularity or osseous lesion. Mild degenerative spurring within the midfoot and hindfoot. Soft tissues about the LEFT foot are unremarkable. IMPRESSION: 1. No acute findings. 2. Mild degenerative spurring within the midfoot and hindfoot. Electronically Signed   By: Franki Cabot M.D.   On: 06/20/2020 17:59    Procedures Procedures   Medications Ordered in ED Medications  lactated ringers infusion ( Intravenous New Bag/Given 06/20/20 1806)  cefTRIAXone (ROCEPHIN) 2 g in sodium chloride 0.9 % 100 mL IVPB (0 g Intravenous Stopped 06/20/20 1800)  sodium chloride 0.9 % bolus 500 mL (0 mLs Intravenous Stopped 06/20/20 1759)    ED Course  I have reviewed the triage vital signs and the nursing notes.  Pertinent labs & imaging results that were available during my care of the patient were reviewed by me and considered in my medical decision making (see chart for details).    MDM Rules/Calculators/A&P                          Patient with cellulitis to the left lower leg.  She will be admitted to medicine Final Clinical Impression(s) / ED Diagnoses Final diagnoses:  Cellulitis of left leg    Rx / DC Orders ED Discharge Orders    None       Milton Ferguson, MD 06/21/20 1107

## 2020-06-21 ENCOUNTER — Observation Stay (HOSPITAL_COMMUNITY): Payer: Medicare Other

## 2020-06-21 DIAGNOSIS — R001 Bradycardia, unspecified: Secondary | ICD-10-CM | POA: Diagnosis present

## 2020-06-21 DIAGNOSIS — E039 Hypothyroidism, unspecified: Secondary | ICD-10-CM | POA: Diagnosis present

## 2020-06-21 DIAGNOSIS — R600 Localized edema: Secondary | ICD-10-CM | POA: Diagnosis not present

## 2020-06-21 DIAGNOSIS — Z8674 Personal history of sudden cardiac arrest: Secondary | ICD-10-CM | POA: Diagnosis not present

## 2020-06-21 DIAGNOSIS — K219 Gastro-esophageal reflux disease without esophagitis: Secondary | ICD-10-CM | POA: Diagnosis present

## 2020-06-21 DIAGNOSIS — H353212 Exudative age-related macular degeneration, right eye, with inactive choroidal neovascularization: Secondary | ICD-10-CM | POA: Diagnosis present

## 2020-06-21 DIAGNOSIS — I4819 Other persistent atrial fibrillation: Secondary | ICD-10-CM | POA: Diagnosis present

## 2020-06-21 DIAGNOSIS — E11628 Type 2 diabetes mellitus with other skin complications: Secondary | ICD-10-CM | POA: Diagnosis present

## 2020-06-21 DIAGNOSIS — L03116 Cellulitis of left lower limb: Secondary | ICD-10-CM | POA: Diagnosis present

## 2020-06-21 DIAGNOSIS — N1831 Chronic kidney disease, stage 3a: Secondary | ICD-10-CM | POA: Diagnosis present

## 2020-06-21 DIAGNOSIS — L02612 Cutaneous abscess of left foot: Secondary | ICD-10-CM | POA: Diagnosis present

## 2020-06-21 DIAGNOSIS — E785 Hyperlipidemia, unspecified: Secondary | ICD-10-CM | POA: Diagnosis present

## 2020-06-21 DIAGNOSIS — E11621 Type 2 diabetes mellitus with foot ulcer: Secondary | ICD-10-CM | POA: Diagnosis present

## 2020-06-21 DIAGNOSIS — K5730 Diverticulosis of large intestine without perforation or abscess without bleeding: Secondary | ICD-10-CM | POA: Diagnosis present

## 2020-06-21 DIAGNOSIS — I129 Hypertensive chronic kidney disease with stage 1 through stage 4 chronic kidney disease, or unspecified chronic kidney disease: Secondary | ICD-10-CM | POA: Diagnosis present

## 2020-06-21 DIAGNOSIS — M1990 Unspecified osteoarthritis, unspecified site: Secondary | ICD-10-CM | POA: Diagnosis present

## 2020-06-21 DIAGNOSIS — L97529 Non-pressure chronic ulcer of other part of left foot with unspecified severity: Secondary | ICD-10-CM | POA: Diagnosis present

## 2020-06-21 DIAGNOSIS — L03032 Cellulitis of left toe: Secondary | ICD-10-CM | POA: Diagnosis present

## 2020-06-21 DIAGNOSIS — M549 Dorsalgia, unspecified: Secondary | ICD-10-CM | POA: Diagnosis present

## 2020-06-21 DIAGNOSIS — L02416 Cutaneous abscess of left lower limb: Secondary | ICD-10-CM | POA: Diagnosis not present

## 2020-06-21 DIAGNOSIS — Z20822 Contact with and (suspected) exposure to covid-19: Secondary | ICD-10-CM | POA: Diagnosis present

## 2020-06-21 DIAGNOSIS — E1122 Type 2 diabetes mellitus with diabetic chronic kidney disease: Secondary | ICD-10-CM | POA: Diagnosis present

## 2020-06-21 DIAGNOSIS — Z66 Do not resuscitate: Secondary | ICD-10-CM | POA: Diagnosis present

## 2020-06-21 DIAGNOSIS — M7989 Other specified soft tissue disorders: Secondary | ICD-10-CM | POA: Diagnosis not present

## 2020-06-21 DIAGNOSIS — I2510 Atherosclerotic heart disease of native coronary artery without angina pectoris: Secondary | ICD-10-CM | POA: Diagnosis present

## 2020-06-21 DIAGNOSIS — H353124 Nonexudative age-related macular degeneration, left eye, advanced atrophic with subfoveal involvement: Secondary | ICD-10-CM | POA: Diagnosis present

## 2020-06-21 DIAGNOSIS — G8929 Other chronic pain: Secondary | ICD-10-CM | POA: Diagnosis present

## 2020-06-21 LAB — BASIC METABOLIC PANEL
Anion gap: 5 (ref 5–15)
BUN: 11 mg/dL (ref 8–23)
CO2: 28 mmol/L (ref 22–32)
Calcium: 8.8 mg/dL — ABNORMAL LOW (ref 8.9–10.3)
Chloride: 106 mmol/L (ref 98–111)
Creatinine, Ser: 1.07 mg/dL — ABNORMAL HIGH (ref 0.44–1.00)
GFR, Estimated: 51 mL/min — ABNORMAL LOW (ref >60)
Glucose, Bld: 118 mg/dL — ABNORMAL HIGH (ref 70–99)
Potassium: 4.9 mmol/L (ref 3.5–5.1)
Sodium: 139 mmol/L (ref 135–145)

## 2020-06-21 LAB — CBC
HCT: 31.1 % — ABNORMAL LOW (ref 36.0–46.0)
Hemoglobin: 10.2 g/dL — ABNORMAL LOW (ref 12.0–15.0)
MCH: 31.1 pg (ref 26.0–34.0)
MCHC: 32.8 g/dL (ref 30.0–36.0)
MCV: 94.8 fL (ref 80.0–100.0)
Platelets: 271 10*3/uL (ref 150–400)
RBC: 3.28 MIL/uL — ABNORMAL LOW (ref 3.87–5.11)
RDW: 15.3 % (ref 11.5–15.5)
WBC: 4.7 10*3/uL (ref 4.0–10.5)
nRBC: 0 % (ref 0.0–0.2)

## 2020-06-21 LAB — SARS CORONAVIRUS 2 (TAT 6-24 HRS): SARS Coronavirus 2: NEGATIVE

## 2020-06-21 LAB — GLUCOSE, CAPILLARY
Glucose-Capillary: 81 mg/dL (ref 70–99)
Glucose-Capillary: 143 mg/dL — ABNORMAL HIGH (ref 70–99)
Glucose-Capillary: 165 mg/dL — ABNORMAL HIGH (ref 70–99)
Glucose-Capillary: 108 mg/dL — ABNORMAL HIGH (ref 70–99)

## 2020-06-21 MED ORDER — DOXYCYCLINE HYCLATE 100 MG PO TABS
100.0000 mg | ORAL_TABLET | Freq: Two times a day (BID) | ORAL | Status: DC
  Administered 2020-06-21 – 2020-06-23 (×5): 100 mg via ORAL
  Filled 2020-06-21 (×5): qty 1

## 2020-06-21 NOTE — TOC Initial Note (Addendum)
Transition of Care [facility]) - Initial/Assessment Note    Patient Details  Name: [name] MRN: [id number] Date of Birth: 04-Feb-1931  Transition of Care [facility]) CM/SW Contact:    Boneta Lucks, RN Phone Number: 06/21/2020, 1:39 PM  Clinical Narrative:    Patient admitted with Cellulitis of left leg. Patient lives at home alone, daughter is next door and checks on her a lot. PT is recommending SNF, if family can not assist. TOC spoke with daughter and patient. Her son is the bedside. Patient is wanting to go home. Her son has setup for an aide for 10 hours a day.  Patient is agreeable to home health RN/PT.  MD will orders. [facility].  Melissa with [facility] accepted the referral.         Patient has had one COVID vaccine, it made her very sick, no other vaccines. She has also had COVID.        Expected Discharge Plan: [place] Barriers to Discharge: Continued Medical Work up   Patient Goals and CMS Choice Patient states their goals for this hospitalization and ongoing recovery are:: to go home. CMS Medicare.gov Compare Post Acute Care list provided to:: Patient Choice offered to / list presented to : Patient  Expected Discharge Plan and Services Expected Discharge Plan: [place]      Living arrangements for the past 2 months: Single Family Home       HH Arranged: RN,PT [place] Agency: [place] ([place]) Date HH Agency Contacted: 06/21/20 Time [place]: [id number] Representative spoke with at [place]: [place] Arrangements/Services Living arrangements for the past 2 months: [place] with:: Self   Do you feel safe going back to the place where you live?: Yes      Need for Family Participation in Patient Care: Yes (Comment) Care giver support system in place?: Yes (comment) Current home services: DME Criminal Activity/Legal Involvement Pertinent to Current  Situation/Hospitalization: No - Comment as needed  Activities of Daily Living Home Assistive Devices/Equipment: Walker (specify type),Cane (specify quad or straight) ADL Screening (condition at time of admission) Patient's cognitive ability adequate to safely complete daily activities?: Yes Is the patient deaf or have difficulty hearing?: Yes Does the patient have difficulty seeing, even when wearing glasses/contacts?: No Does the patient have difficulty concentrating, remembering, or making decisions?: No Patient able to express need for assistance with ADLs?: Yes Does the patient have difficulty dressing or bathing?: Yes Independently performs ADLs?: No Communication: Independent Dressing (OT): Needs assistance Is this a change from baseline?: Pre-admission baseline Grooming: Needs assistance Is this a change from baseline?: Pre-admission baseline Feeding: Independent Bathing: Needs assistance Is this a change from baseline?: Pre-admission baseline Toileting: Needs assistance Is this a change from baseline?: Pre-admission baseline In/Out Bed: Needs assistance Is this a change from baseline?: Pre-admission baseline Walks in Home: Needs assistance Is this a change from baseline?: Pre-admission baseline Does the patient have difficulty walking or climbing stairs?: Yes Weakness of Legs: Both Weakness of Arms/Hands: None  Permission Sought/Granted      Share Information with NAME: [name]     Permission granted to share info w Relationship: Daugher  Permission granted to share info w Contact Information: SON-at the bedside  Emotional Assessment     Affect (typically observed): Accepting,Pleasant Orientation: : Oriented to Place,Oriented to Self,Oriented to  Time,Oriented to Situation Alcohol / Substance Use: Not Applicable  Psych Involvement: No (comment)  Admission diagnosis:  Cellulitis of left leg [L03.116] Patient Active Problem List   Diagnosis Date Noted  .  Cellulitis of left leg 06/20/2020  . Loss of weight 05/18/2020  . 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[place]) 05/12/2020  . Intractable abdominal pain 03/28/2020  . Lactic acidosis 03/28/2020  . Elevated troponin I level 03/28/2020  . Malnutrition of moderate degree 12/26/2019  . Prolonged QT interval 12/23/2019  . Acute lower UTI 12/23/2019  . Constipation 11/27/2019  . Diarrhea 11/27/2019  . Lesion of pancreas 11/26/2019  . Fibromuscular dysplasia of right renal artery ([place]) 11/26/2019  . Left renal artery stenosis ([place]) 11/26/2019  . RUQ pain   . Abnormal CT of the abdomen   . Vertigo 11/15/2019  . Advanced care planning/counseling discussion 11/15/2019  . CAD (coronary artery disease)   . Hypothyroidism   . Hyponatremia   . Volume depletion   . TSH elevation 08/01/2019  . Hospital discharge follow-up 08/01/2019  . Exudative age-related macular degeneration of right eye with inactive choroidal neovascularization ([place]) 04/30/2019  . Retinal hemorrhage of right eye 04/30/2019  . Advanced nonexudative age-related macular degeneration of left eye with subfoveal involvement 04/30/2019  . Exudative age-related macular degeneration of left eye with inactive choroidal neovascularization ([place]) 04/30/2019  . Advanced nonexudative age-related macular degeneration of right eye with subfoveal involvement 04/30/2019  . Posterior vitreous detachment of both eyes 04/30/2019  . Secondary hypercoagulable state ([place]) 03/11/2019  . Persistent atrial fibrillation ([place])   . Proctitis 01/09/2019  . Atrial fibrillation, chronic ([place]) 01/09/2019  . Hyperlipidemia 01/09/2019  . Nausea 01/09/2019  . Hyperglycemia 01/09/2019  . COVID-19 virus infection 12/28/2018  . Chronic diarrhea 09/18/2018  . Abdominal pain 09/18/2018  . Osteoarthritis of right knee 03/29/2018  . GERD (gastroesophageal reflux disease)   . Dehydration 02/06/2018  . Hypokalemia 02/06/2018  . Diarrhea of presumed infectious origin 02/06/2018  . Acute  kidney injury superimposed on CKD ([place]) 02/05/2018  . Essential hypertension 12/21/2017  . Degenerative scoliosis 02/19/2017  . Burst fracture of lumbar vertebra ([place]) 02/19/2017  . History of drug-induced prolonged QT interval with torsade de pointes 11/01/2016  . Iron deficiency anemia 10/30/2016  . Long term current use of anticoagulant 02/17/2016  . Near syncope 05/22/2015  . Diabetes mellitus type 2, diet-controlled ([place]) 09/14/2014  . ICD (implantable cardioverter-defibrillator) battery depletion 01/20/2014  . Abnormal nuclear stress test, 12/30/13 12/31/2013  . Coronary artery disease involving native coronary artery of native heart without angina pectoris 12/31/2013  . Paroxysmal atrial fibrillation, chads2 Vasc2 score of 5, on eliquis 10/16/2013  . Catecholaminergic polymorphic ventricular tachycardia ([place]) 10/16/2013  . Ureteral stone with hydronephrosis 05/20/2013  . Metabolic syndrome [id number]  . Orthostatic hypotension 07/27/2011  . Chest pain 07/25/2011  . Weakness 07/25/2011    Class: Acute  . Chronic sinus bradycardia 07/25/2011  . ICD (implantable cardioverter-defibrillator) in place 07/25/2011  . CKD (chronic kidney disease), stage III ([place]) 07/25/2011    Class: Chronic  . History of DVT in the past, on eliquis now 07/25/2011    Class: History of  . Nephrolithiasis, just saw Dr [name] 07/25/2011    Class: History of  . DYSPHAGIA 03/24/2010  . Chronic ischemic heart disease 12/03/2007  . Irritable bowel syndrome with diarrhea 12/03/2007  . EPIGASTRIC PAIN 12/03/2007  . Hyperlipidemia associated with type 2 diabetes mellitus ([place]) 12/02/2007    Class: History of  . EXTERNAL HEMORRHOIDS 12/02/2007    Class: History of  . ESOPHAGITIS 12/02/2007  . GASTRITIS  12/02/2007  . DIVERTICULOSIS, COLON 12/02/2007  . ARTHRITIS 12/02/2007    Class: Chronic   PCP:  [name], DO Pharmacy:   [place], [place] [address] Phone: [phone] Fax: [phone]  Readmission Risk Interventions Readmission Risk Prevention Plan 03/28/2020 11/17/2019  Transportation Screening Complete Complete  Medication Review ([place]) Complete Complete  PCP or Specialist appointment within 3-5 days of discharge - Complete  HRI or [place] Complete Complete  SW Recovery Care/Counseling Consult Complete Complete  Palliative Care Screening Not Applicable Not [place] Not Applicable Not Applicable  Some recent data might be hidden

## 2020-06-21 NOTE — Progress Notes (Signed)
Hypoglycemic Event  CBG: 57  Treatment: D50 25 mL (12.5 gm)  Symptoms: None  Follow-up CBG: Time:2112 CBG Result:112  Possible Reasons for Event: Inadequate meal intake  Comments/MD notified:E.Emokpae acknowledged    [name]

## 2020-06-21 NOTE — Plan of Care (Signed)

## 2020-06-21 NOTE — Progress Notes (Signed)
Patient Demographics:    [name], is a 87 y.o. female, DOB - April 16, 1931, [id number]  Admit date - 06/20/2020   Admitting Physician Diera Wirkkala Denton Brick, MD  Outpatient Primary MD for the patient is [name], DO  LOS - 0   Chief Complaint  Patient presents with  . Leg Swelling        Subjective:    [name] today has no fevers, no emesis,  No further chest pain,   -Left foot pain persist  Assessment  & Plan :    Principal Problem:   Cellulitis of left leg Active Problems:   Chronic sinus bradycardia   ICD (implantable cardioverter-defibrillator) in place   History of DVT in the past, on eliquis now   Paroxysmal atrial fibrillation, chads2 Vasc2 score of 5, on eliquis   CAD (coronary artery disease)   Cellulitis and abscess of left lower extremity  Brief Summary:-  87 y.o. female with medical history significant for atrial fibrillation and DVT, coronary artery disease with AICD admitted with left foot and left lower extremity cellulitis after failing outpatient management by her podiatrist  A/p  1)Lt big toe/left foot and left lower extremity cellulitis--- failed conservative outpatient management by podiatrist of left big toe ulcer/wound -Continue IV Rocephin, add doxycycline for community MRSA coverage  2)CAD/AICD in situ--status post prior stenting 1993 - -EKG is nonacute -Ruled out for ACS by cardiac enzymes and EKG -no further chest pains, echo from November 2021 with preserved EF of 55% with grade 1 diastolic dysfunction, -Not on aspirin due to need for Eliquis for DVT treatment -Continue isosorbide  3) hypothyroidism--- continue atorvastatin  4)GERD--continue Protonix, has had extensive GI work-up in the recent past  5)H/o DVT--continue Eliquis  Disposition/Need for in-Hospital Stay- patient unable to be discharged at this time due to --- cellulitis failing  conservative outpatient management requiring IV antibiotics*  Status is: Inpatient  Remains inpatient appropriate because:Please see disposition above   Disposition: The patient is from: Home              Anticipated d/c is to: Home              Anticipated d/c date is: 2 days              Patient currently is not medically stable to d/c. Barriers: Not Clinically Stable-   Code Status :  -  Code Status: DNR   Family Communication:    NA (patient is alert, awake and coherent)   Consults  :    DVT Prophylaxis  :   - SCDs  apixaban (ELIQUIS) tablet 2.5 mg Start: 06/20/20 2200 apixaban (ELIQUIS) tablet 2.5 mg    Lab Results  Component Value Date   PLT 271 06/21/2020    Inpatient Medications  Scheduled Meds: . apixaban  2.5 mg Oral BID  . docusate sodium  100 mg Oral BID  . doxycycline  100 mg Oral Q12H  . feeding supplement  1 Container Oral BID BM  . isosorbide mononitrate  15 mg Oral q morning  . levothyroxine  100 mcg Oral q morning  . melatonin  3 mg Oral QHS  . pantoprazole  40 mg Oral q morning  . PARoxetine  20 mg  Oral q morning  . polyethylene glycol  17 g Oral BID   Continuous Infusions: . cefTRIAXone (ROCEPHIN)  IV 2 g (06/21/20 1654)   PRN Meds:.acetaminophen **OR** acetaminophen, morphine injection    Anti-infectives (From admission, onward)   Start     Dose/Rate Route Frequency Ordered Stop   06/21/20 1030  doxycycline (VIBRA-TABS) tablet 100 mg        100 mg Oral Every 12 hours 06/21/20 0941     06/20/20 1730  cefTRIAXone (ROCEPHIN) 2 g in sodium chloride 0.9 % 100 mL IVPB        2 g 200 mL/hr over 30 Minutes Intravenous Every 24 hours 06/20/20 1715          Objective:   Vitals:   06/20/20 2219 06/21/20 0041 06/21/20 0443 06/21/20 1507  BP: (!) 174/70 (!) 169/75 131/67 (!) 161/70  Pulse:  (!) 59 63 (!) 54  Resp:  '18 18 18  '$ Temp:  98.5 F (36.9 C) 98.7 F (37.1 C) 97.7 F (36.5 C)  TempSrc:  Oral Oral Oral  SpO2:  97% 96% 100%  Weight:       Height:        Wt Readings from Last 3 Encounters:  06/20/20 56.6 kg  06/03/20 54 kg  06/01/20 53.5 kg     Intake/Output Summary (Last 24 hours) at 06/21/2020 1828 Last data filed at 06/21/2020 1700 Gross per 24 hour  Intake 960 ml  Output 400 ml  Net 560 ml     Physical Exam  Gen:- Awake Alert,  In no apparent distress  HEENT:- [place].AT, No sclera icterus Neck-Supple Neck,No JVD,.  Lungs-  CTAB , fair symmetrical air movement CV- S1, S2 normal, regular  Abd-  +ve B.Sounds, Abd Soft, No tenderness,    Extremity/Skin:- No  edema, pedal pulses present , left great toe on left foot and left leg cellulitis please see photos in epic Psych-affect is appropriate, oriented x3 Neuro-no new focal deficits, no tremors   Data Review:   Micro Results Recent Results (from the past 240 hour(s))  Blood Culture (routine x 2)     Status: None (Preliminary result)   Collection Time: 06/20/20  5:00 PM   Specimen: Left Antecubital; Blood  Result Value Ref Range Status   Specimen Description LEFT ANTECUBITAL  Final   Special Requests   Final    BOTTLES DRAWN AEROBIC AND ANAEROBIC Blood Culture adequate volume   Culture   Final    NO GROWTH < 24 HOURS Performed at [facility], [address], [place], [place] [address]    Report Status PENDING  Incomplete  Blood Culture (routine x 2)     Status: None (Preliminary result)   Collection Time: 06/20/20  5:36 PM   Specimen: BLOOD LEFT WRIST  Result Value Ref Range Status   Specimen Description BLOOD LEFT WRIST  Final   Special Requests   Final    BOTTLES DRAWN AEROBIC AND ANAEROBIC Blood Culture adequate volume   Culture   Final    NO GROWTH < 24 HOURS Performed at [facility], [address] [place], [place] [address]    Report Status PENDING  Incomplete  SARS CORONAVIRUS 2 (TAT 6-24 HRS) Nasopharyngeal Nasopharyngeal Swab     Status: None   Collection Time: 06/20/20  7:37 PM   Specimen: Nasopharyngeal Swab  Result Value Ref Range  Status   SARS Coronavirus 2 NEGATIVE NEGATIVE Final    Comment: (NOTE) SARS-CoV-2 target nucleic acids are NOT  DETECTED.  The SARS-CoV-2 RNA is generally detectable in upper and lower respiratory specimens during the acute phase of infection. Negative results do not preclude SARS-CoV-2 infection, do not rule out co-infections with other pathogens, and should not be used as the sole basis for treatment or other patient management decisions. Negative results must be combined with clinical observations, patient history, and epidemiological information. The expected result is Negative.  Fact Sheet for Patients: SugarRoll.be  Fact Sheet for Healthcare Providers: https://www.woods-mathews.com/  This test is not yet approved or cleared by the [place] FDA and  has been authorized for detection and/or diagnosis of SARS-CoV-2 by FDA under an Emergency Use Authorization (EUA). This EUA will remain  in effect (meaning this test can be used) for the duration of the COVID-19 declaration under Se ction 564(b)(1) of the Act, 21 U.S.C. section 360bbb-3(b)(1), unless the authorization is terminated or revoked sooner.  Performed at [facility], [place] [address] [place],  [address]     Radiology Reports CT ABDOMEN PELVIS WO CONTRAST  Result Date: 06/03/2020 CLINICAL DATA:  87 year old female with abdominal pain and malaise. EXAM: CT ABDOMEN AND PELVIS WITHOUT CONTRAST TECHNIQUE: Multidetector CT imaging of the abdomen and pelvis was performed following the standard protocol without IV contrast. COMPARISON:  CT Abdomen and Pelvis 03/28/2020 and earlier. FINDINGS: Lower chest: Cardiomegaly and cardiac pacemaker leads. Larger lung volumes with mild to moderate bibasilar lung scarring. Subcutaneous electrical leads continue from the left chest to the upper abdomen. Hepatobiliary: Hyperdense liver which might be sequelae of amiodarone therapy.  Otherwise negative liver and gallbladder. Pancreas: Negative. Spleen: Negative. Adrenals/Urinary Tract: Normal adrenal glands. Extensive medullary calcinosis with moderate atrophy of the left kidney. Stable kidneys and ureters since March with no obstructive uropathy. Moderately distended urinary bladder similar to March (370 mL). No bladder wall thickening or perivesical stranding. Stomach/Bowel: Redundant large bowel with retained stool and intermittent diverticulosis. Oral contrast has reached the descending colon without evidence of obstruction. No large bowel inflammation. Gas and contrast containing right colon. Normal appendix terminating on coronal image 46. Decompressed and negative terminal ileum. No dilated small bowel. Decompressed stomach and duodenum. No free air, free fluid. Vascular/Lymphatic: Aortoiliac calcified atherosclerosis. Normal caliber abdominal aorta. Vascular patency is not evaluated in the absence of IV contrast. Reproductive: Negative noncontrast appearance. Other: No pelvic free fluid. Musculoskeletal: Moderate lumbar scoliosis with advanced disc and endplate degeneration. Mild T12 and L1 compression fractures appear stable. No acute osseous abnormality identified. IMPRESSION: 1. No acute or inflammatory process identified in the abdomen or pelvis. 2. Extensive chronic nephrolithiasis and/or medullary calcinosis with no obstructive uropathy today. Chronic left renal atrophy. Continued bladder distension (370 mL), query urinary retention. 3. Cardiomegaly. Redundant large bowel with intermittent diverticula. Aortic Atherosclerosis (ICD10-I70.0). Electronically Signed   By: Genevie Ann M.D.   On: 06/03/2020 06:57   US Venous Img Lower Unilateral Left (DVT)  Result Date: 06/21/2020 CLINICAL DATA:  Leg swelling EXAM: Left LOWER EXTREMITY VENOUS DOPPLER ULTRASOUND TECHNIQUE: Gray-scale sonography with compression, as well as color and duplex ultrasound, were performed to evaluate the deep  venous system(s) from the level of the common femoral vein through the popliteal and proximal calf veins. COMPARISON:  None. FINDINGS: VENOUS Normal compressibility of the common femoral, superficial femoral, and popliteal veins, as well as the visualized calf veins. Visualized portions of profunda femoral vein and great saphenous vein unremarkable. No filling defects to suggest DVT on grayscale or color Doppler imaging. Doppler waveforms show normal direction of  venous flow, normal respiratory plasticity and response to augmentation. Limited views of the contralateral common femoral vein are unremarkable. OTHER Mild subcutaneous edema in the left lower leg/left ankle. Limitations: none IMPRESSION: No evidence of DVT in the left lower extremity. Mild subcutaneous edema in the left lower leg/left ankle. Electronically Signed   By: Maurine Simmering   On: 06/21/2020 13:58   DG Chest Port 1 View  Result Date: 06/20/2020 CLINICAL DATA:  Questionable sepsis. Nausea. Lower extremity swelling. EXAM: PORTABLE CHEST 1 VIEW COMPARISON:  Chest x-ray dated 12/22/2019. FINDINGS: Heart size and mediastinal contours are stable. LEFT chest wall pacemaker/ICD apparatus in place. Coarse lung markings bilaterally, grossly stable. No confluent opacity to suggest a developing pneumonia. No pleural effusion or pneumothorax is seen. Osseous structures about the chest are unremarkable. IMPRESSION: 1. No active disease. No evidence of pneumonia or pulmonary edema. 2. Chronic interstitial lung disease. Electronically Signed   By: Franki Cabot M.D.   On: 06/20/2020 17:57   DG Abdomen Acute W/Chest  Result Date: 06/03/2020 CLINICAL DATA:  chest and abdominal pain EXAM: DG ABDOMEN ACUTE WITH 1 VIEW CHEST COMPARISON:  CT March 28, 2020 FINDINGS: There is no evidence of dilated bowel loops or free intraperitoneal air. Gas visualized over the rectal vault. Bilateral renal calculi. Catheter tubing overlies the pelvis. Aortic atherosclerosis. The  bow convex curvature of the lumbar spine with multilevel degenerative changes spine. Left chest generator device with 3 leads overlying the cardiac silhouette and additional leads overlie the mid abdomen. Borderline cardiac enlargement. Chronic bronchitic lung changes. No focal consolidation. No visible pleural effusion or pneumothorax. IMPRESSION: 1. Nonobstructive bowel gas pattern. 2. Bilateral renal calculi. 3. No acute cardiopulmonary disease. Electronically Signed   By: Dahlia Bailiff MD   On: 06/03/2020 01:30   DG Foot 2 Views Left  Result Date: 06/20/2020 CLINICAL DATA:  LEFT lower extremity swelling. EXAM: LEFT FOOT - 2 VIEW COMPARISON:  None. FINDINGS: Osseous alignment is normal. No fracture line or displaced fracture fragment. No acute-appearing cortical irregularity or osseous lesion. Mild degenerative spurring within the midfoot and hindfoot. Soft tissues about the LEFT foot are unremarkable. IMPRESSION: 1. No acute findings. 2. Mild degenerative spurring within the midfoot and hindfoot. Electronically Signed   By: Franki Cabot M.D.   On: 06/20/2020 17:59   OCT, Retina - OU - Both Eyes  Result Date: 06/09/2020 Right Eye Quality was good. Scan locations included subfoveal. Central Foveal Thickness: 269. Progression has been stable. Findings include subretinal scarring, abnormal foveal contour, no IRF, no SRF, subretinal hyper-reflective material, outer retinal atrophy, central retinal atrophy. Left Eye Quality was good. Scan locations included subfoveal. Central Foveal Thickness: 283. Progression has been stable. Findings include no IRF, no SRF, abnormal foveal contour, subretinal hyper-reflective material, central retinal atrophy, outer retinal atrophy, inner retinal atrophy. Notes OD macular findings stable with subretinal scarring, last injection was done 9 months previous Bilateral dry ARMD, no effective therapy at this time.  Bilateral subfoveal disciform limit vision as well but no active  disease at this time.    CBC Recent Labs  Lab 06/20/20 1730 06/21/20 0603  WBC 5.2 4.7  HGB 11.1* 10.2*  HCT 34.4* 31.1*  PLT 285 271  MCV 95.8 94.8  MCH 30.9 31.1  MCHC 32.3 32.8  RDW 15.4 15.3  LYMPHSABS 1.1  --   MONOABS 0.6  --   EOSABS 0.3  --   BASOSABS 0.0  --     Chemistries  Recent Labs  Lab  06/20/20 1422 06/20/20 1730 06/21/20 1025  NA  --  132* 139  K  --  3.6 4.9  CL  --  100 106  CO2  --  25 28  GLUCOSE  --  204* 118*  BUN  --  16 11  CREATININE  --  1.08* 1.07*  CALCIUM  --  8.4* 8.8*  MG 2.0  --   --   AST  --  19  --   ALT  --  13  --   ALKPHOS  --  73  --   BILITOT  --  0.6  --    ------------------------------------------------------------------------------------------------------------------ No results for input(s): CHOL, HDL, LDLCALC, TRIG, CHOLHDL, LDLDIRECT in the last 72 hours.  Lab Results  Component Value Date   HGBA1C 6.6 08/15/2019   ------------------------------------------------------------------------------------------------------------------ No results for input(s): TSH, T4TOTAL, T3FREE, THYROIDAB in the last 72 hours.  Invalid input(s): FREET3 ------------------------------------------------------------------------------------------------------------------ No results for input(s): VITAMINB12, FOLATE, FERRITIN, TIBC, IRON, RETICCTPCT in the last 72 hours.  Coagulation profile Recent Labs  Lab 06/20/20 1730  INR 1.2    No results for input(s): DDIMER in the last 72 hours.  Cardiac Enzymes No results for input(s): CKMB, TROPONINI, MYOGLOBIN in the last 168 hours.  Invalid input(s): CK ------------------------------------------------------------------------------------------------------------------    Component Value Date/Time   BNP 109.0 (H) 11/14/2019 2157     Roxan Hockey M.D on 06/21/2020 at 6:28 PM  Go to [email or web address] - for contact info  [facility] - Office  [phone]

## 2020-06-22 LAB — URINE CULTURE

## 2020-06-22 LAB — GLUCOSE, CAPILLARY: Glucose-Capillary: 116 mg/dL — ABNORMAL HIGH (ref 70–99)

## 2020-06-22 MED ORDER — METOPROLOL TARTRATE 25 MG PO TABS
12.5000 mg | ORAL_TABLET | Freq: Two times a day (BID) | ORAL | Status: DC
  Administered 2020-06-22: 12.5 mg via ORAL
  Filled 2020-06-22 (×3): qty 1

## 2020-06-22 MED ORDER — ISOSORBIDE MONONITRATE ER 60 MG PO TB24
30.0000 mg | ORAL_TABLET | Freq: Every morning | ORAL | Status: DC
  Administered 2020-06-22 – 2020-06-23 (×2): 30 mg via ORAL
  Filled 2020-06-22 (×2): qty 1

## 2020-06-22 NOTE — Plan of Care (Signed)

## 2020-06-22 NOTE — Progress Notes (Signed)
Patient Demographics:    [name], is a 87 y.o. female, DOB - 1931/03/31, UQ:[id number]  Admit date - 06/20/2020   Admitting Physician Fia Hebert Denton Brick, MD  Outpatient Primary MD for the patient is [name], DO  LOS - 1   Chief Complaint  Patient presents with  . Leg Swelling        Subjective:    [name] today has no fevers, no emesis,  No further chest pain,   -Left foot pain and swelling improving  Assessment  & Plan :    Principal Problem:   Cellulitis of left leg Active Problems:   Chronic sinus bradycardia   ICD (implantable cardioverter-defibrillator) in place   History of DVT in the past, on eliquis now   Paroxysmal atrial fibrillation, chads2 Vasc2 score of 5, on eliquis   CAD (coronary artery disease)   Cellulitis and abscess of left lower extremity  Brief Summary:-  87 y.o. female with medical history significant for atrial fibrillation and DVT, coronary artery disease with AICD admitted with left foot and left lower extremity cellulitis after failing outpatient management by her podiatrist  A/p  1)Lt big toe/left foot and left lower extremity cellulitis--- failed conservative outpatient management by podiatrist of left big toe ulcer/wound -Overall improving -Wound care consult appreciated-recommends painting the left great toe with betadine and allow to air dry. Apply daily.  -Continue IV Rocephin, add doxycycline for community MRSA coverage  2)CAD/AICD in situ--status post prior stenting 1993 - -EKG is nonacute -Ruled out for ACS by cardiac enzymes and EKG -no further chest pains, echo from November 2021 with preserved EF of 55% with grade 1 diastolic dysfunction, -Not on aspirin due to need for Eliquis for DVT treatment -Continue isosorbide  3) hypothyroidism--- continue atorvastatin  4)GERD--continue Protonix, has had extensive GI work-up in the  recent past  5)H/o DVT--continue Eliquis  Disposition/Need for in-Hospital Stay- patient unable to be discharged at this time due to --- cellulitis failing conservative outpatient management requiring IV antibiotics*  Status is: Inpatient  Remains inpatient appropriate because:Please see disposition above   Disposition: The patient is from: Home              Anticipated d/c is to: Home              Anticipated d/c date is: 1 day              Patient currently is not medically stable to d/c. Barriers: Not Clinically Stable-   Code Status :  -  Code Status: DNR   Family Communication:    NA (patient is alert, awake and coherent)   Consults  :    DVT Prophylaxis  :   - SCDs  apixaban (ELIQUIS) tablet 2.5 mg Start: 06/20/20 2200 apixaban (ELIQUIS) tablet 2.5 mg    Lab Results  Component Value Date   PLT 271 06/21/2020    Inpatient Medications  Scheduled Meds: . apixaban  2.5 mg Oral BID  . docusate sodium  100 mg Oral BID  . doxycycline  100 mg Oral Q12H  . feeding supplement  1 Container Oral BID BM  . isosorbide mononitrate  30 mg Oral q morning  . levothyroxine  100 mcg Oral q morning  .  melatonin  3 mg Oral QHS  . metoprolol tartrate  12.5 mg Oral BID  . pantoprazole  40 mg Oral q morning  . PARoxetine  20 mg Oral q morning  . polyethylene glycol  17 g Oral BID   Continuous Infusions: . cefTRIAXone (ROCEPHIN)  IV 2 g (06/21/20 1654)   PRN Meds:.acetaminophen **OR** acetaminophen, morphine injection    Anti-infectives (From admission, onward)   Start     Dose/Rate Route Frequency Ordered Stop   06/21/20 1030  doxycycline (VIBRA-TABS) tablet 100 mg        100 mg Oral Every 12 hours 06/21/20 0941     06/20/20 1730  cefTRIAXone (ROCEPHIN) 2 g in sodium chloride 0.9 % 100 mL IVPB        2 g 200 mL/hr over 30 Minutes Intravenous Every 24 hours 06/20/20 1715          Objective:   Vitals:   06/21/20 1507 06/21/20 2054 06/22/20 0436 06/22/20 1549  BP: (!)  161/70 (!) 163/84 (!) 177/80 (!) 153/76  Pulse: (!) 54 60 (!) 57 (!) 54  Resp: '18 18 18 18  '$ Temp: 97.7 F (36.5 C) 98.2 F (36.8 C) 98.3 F (36.8 C) 98.7 F (37.1 C)  TempSrc: Oral Oral    SpO2: 100% 99% 97% 99%  Weight:      Height:        Wt Readings from Last 3 Encounters:  06/20/20 56.6 kg  06/03/20 54 kg  06/01/20 53.5 kg    Intake/Output Summary (Last 24 hours) at 06/22/2020 1607 Last data filed at 06/22/2020 1300 Gross per 24 hour  Intake 960 ml  Output 1200 ml  Net -240 ml   Physical Exam Gen:- Awake Alert,  In no apparent distress  HEENT:- [place].AT, No sclera icterus Neck-Supple Neck,No JVD,.  Lungs-  CTAB , fair symmetrical air movement CV- S1, S2 normal, regular  Abd-  +ve B.Sounds, Abd Soft, No tenderness,    Extremity/Skin:-, pedal pulses present , left great toe on left foot and left leg cellulitis please see photos in epic--- erythema, swelling or tenderness appears to be improving Psych-affect is appropriate, oriented x3 Neuro-no new focal deficits, no tremors   Data Review:   Micro Results Recent Results (from the past 240 hour(s))  Blood Culture (routine x 2)     Status: None (Preliminary result)   Collection Time: 06/20/20  5:00 PM   Specimen: Left Antecubital; Blood  Result Value Ref Range Status   Specimen Description LEFT ANTECUBITAL  Final   Special Requests   Final    BOTTLES DRAWN AEROBIC AND ANAEROBIC Blood Culture adequate volume   Culture   Final    NO GROWTH 2 DAYS Performed at [facility], [address] [place], [place] [address]    Report Status PENDING  Incomplete  Blood Culture (routine x 2)     Status: None (Preliminary result)   Collection Time: 06/20/20  5:36 PM   Specimen: BLOOD LEFT WRIST  Result Value Ref Range Status   Specimen Description BLOOD LEFT WRIST  Final   Special Requests   Final    BOTTLES DRAWN AEROBIC AND ANAEROBIC Blood Culture adequate volume   Culture   Final    NO GROWTH 2 DAYS Performed at [facility], [address], [place], [place] [address]    Report Status PENDING  Incomplete  SARS CORONAVIRUS 2 (TAT 6-24 HRS) Nasopharyngeal Nasopharyngeal Swab     Status: None   Collection Time:  06/20/20  7:37 PM   Specimen: Nasopharyngeal Swab  Result Value Ref Range Status   SARS Coronavirus 2 NEGATIVE NEGATIVE Final    Comment: (NOTE) SARS-CoV-2 target nucleic acids are NOT DETECTED.  The SARS-CoV-2 RNA is generally detectable in upper and lower respiratory specimens during the acute phase of infection. Negative results do not preclude SARS-CoV-2 infection, do not rule out co-infections with other pathogens, and should not be used as the sole basis for treatment or other patient management decisions. Negative results must be combined with clinical observations, patient history, and epidemiological information. The expected result is Negative.  Fact Sheet for Patients: SugarRoll.be  Fact Sheet for Healthcare Providers: https://www.woods-mathews.com/  This test is not yet approved or cleared by the [place] FDA and  has been authorized for detection and/or diagnosis of SARS-CoV-2 by FDA under an Emergency Use Authorization (EUA). This EUA will remain  in effect (meaning this test can be used) for the duration of the COVID-19 declaration under Se ction 564(b)(1) of the Act, 21 U.S.C. section 360bbb-3(b)(1), unless the authorization is terminated or revoked sooner.  Performed at [facility], [place] [address] [place], [place] [address]   Urine culture     Status: Abnormal   Collection Time: 06/20/20  8:31 PM   Specimen: In/Out Cath Urine  Result Value Ref Range Status   Specimen Description   Final    IN/OUT CATH URINE Performed at [facility], [address], [place], [place] [address]    Special Requests   Final    NONE Performed at [facility], [address], [place], [place] [address]    Culture MULTIPLE SPECIES PRESENT,  SUGGEST RECOLLECTION (A)  Final   Report Status 06/22/2020 FINAL  Final   Radiology Reports CT ABDOMEN PELVIS WO CONTRAST  Result Date: 06/03/2020 CLINICAL DATA:  87 year old female with abdominal pain and malaise. EXAM: CT ABDOMEN AND PELVIS WITHOUT CONTRAST TECHNIQUE: Multidetector CT imaging of the abdomen and pelvis was performed following the standard protocol without IV contrast. COMPARISON:  CT Abdomen and Pelvis 03/28/2020 and earlier. FINDINGS: Lower chest: Cardiomegaly and cardiac pacemaker leads. Larger lung volumes with mild to moderate bibasilar lung scarring. Subcutaneous electrical leads continue from the left chest to the upper abdomen. Hepatobiliary: Hyperdense liver which might be sequelae of amiodarone therapy. Otherwise negative liver and gallbladder. Pancreas: Negative. Spleen: Negative. Adrenals/Urinary Tract: Normal adrenal glands. Extensive medullary calcinosis with moderate atrophy of the left kidney. Stable kidneys and ureters since March with no obstructive uropathy. Moderately distended urinary bladder similar to March (370 mL). No bladder wall thickening or perivesical stranding. Stomach/Bowel: Redundant large bowel with retained stool and intermittent diverticulosis. Oral contrast has reached the descending colon without evidence of obstruction. No large bowel inflammation. Gas and contrast containing right colon. Normal appendix terminating on coronal image 46. Decompressed and negative terminal ileum. No dilated small bowel. Decompressed stomach and duodenum. No free air, free fluid. Vascular/Lymphatic: Aortoiliac calcified atherosclerosis. Normal caliber abdominal aorta. Vascular patency is not evaluated in the absence of IV contrast. Reproductive: Negative noncontrast appearance. Other: No pelvic free fluid. Musculoskeletal: Moderate lumbar scoliosis with advanced disc and endplate degeneration. Mild T12 and L1 compression fractures appear stable. No acute osseous abnormality  identified. IMPRESSION: 1. No acute or inflammatory process identified in the abdomen or pelvis. 2. Extensive chronic nephrolithiasis and/or medullary calcinosis with no obstructive uropathy today. Chronic left renal atrophy. Continued bladder distension (370 mL), query urinary retention. 3. Cardiomegaly. Redundant large bowel with intermittent diverticula.  Aortic Atherosclerosis (ICD10-I70.0). Electronically Signed   By: Genevie Ann M.D.   On: 06/03/2020 06:57   US Venous Img Lower Unilateral Left (DVT)  Result Date: 06/21/2020 CLINICAL DATA:  Leg swelling EXAM: Left LOWER EXTREMITY VENOUS DOPPLER ULTRASOUND TECHNIQUE: Gray-scale sonography with compression, as well as color and duplex ultrasound, were performed to evaluate the deep venous system(s) from the level of the common femoral vein through the popliteal and proximal calf veins. COMPARISON:  None. FINDINGS: VENOUS Normal compressibility of the common femoral, superficial femoral, and popliteal veins, as well as the visualized calf veins. Visualized portions of profunda femoral vein and great saphenous vein unremarkable. No filling defects to suggest DVT on grayscale or color Doppler imaging. Doppler waveforms show normal direction of venous flow, normal respiratory plasticity and response to augmentation. Limited views of the contralateral common femoral vein are unremarkable. OTHER Mild subcutaneous edema in the left lower leg/left ankle. Limitations: none IMPRESSION: No evidence of DVT in the left lower extremity. Mild subcutaneous edema in the left lower leg/left ankle. Electronically Signed   By: Maurine Simmering   On: 06/21/2020 13:58   DG Chest Port 1 View  Result Date: 06/20/2020 CLINICAL DATA:  Questionable sepsis. Nausea. Lower extremity swelling. EXAM: PORTABLE CHEST 1 VIEW COMPARISON:  Chest x-ray dated 12/22/2019. FINDINGS: Heart size and mediastinal contours are stable. LEFT chest wall pacemaker/ICD apparatus in place. Coarse lung markings  bilaterally, grossly stable. No confluent opacity to suggest a developing pneumonia. No pleural effusion or pneumothorax is seen. Osseous structures about the chest are unremarkable. IMPRESSION: 1. No active disease. No evidence of pneumonia or pulmonary edema. 2. Chronic interstitial lung disease. Electronically Signed   By: Franki Cabot M.D.   On: 06/20/2020 17:57   DG Abdomen Acute W/Chest  Result Date: 06/03/2020 CLINICAL DATA:  chest and abdominal pain EXAM: DG ABDOMEN ACUTE WITH 1 VIEW CHEST COMPARISON:  CT March 28, 2020 FINDINGS: There is no evidence of dilated bowel loops or free intraperitoneal air. Gas visualized over the rectal vault. Bilateral renal calculi. Catheter tubing overlies the pelvis. Aortic atherosclerosis. The bow convex curvature of the lumbar spine with multilevel degenerative changes spine. Left chest generator device with 3 leads overlying the cardiac silhouette and additional leads overlie the mid abdomen. Borderline cardiac enlargement. Chronic bronchitic lung changes. No focal consolidation. No visible pleural effusion or pneumothorax. IMPRESSION: 1. Nonobstructive bowel gas pattern. 2. Bilateral renal calculi. 3. No acute cardiopulmonary disease. Electronically Signed   By: Dahlia Bailiff MD   On: 06/03/2020 01:30   DG Foot 2 Views Left  Result Date: 06/20/2020 CLINICAL DATA:  LEFT lower extremity swelling. EXAM: LEFT FOOT - 2 VIEW COMPARISON:  None. FINDINGS: Osseous alignment is normal. No fracture line or displaced fracture fragment. No acute-appearing cortical irregularity or osseous lesion. Mild degenerative spurring within the midfoot and hindfoot. Soft tissues about the LEFT foot are unremarkable. IMPRESSION: 1. No acute findings. 2. Mild degenerative spurring within the midfoot and hindfoot. Electronically Signed   By: Franki Cabot M.D.   On: 06/20/2020 17:59   OCT, Retina - OU - Both Eyes  Result Date: 06/09/2020 Right Eye Quality was good. Scan locations  included subfoveal. Central Foveal Thickness: 269. Progression has been stable. Findings include subretinal scarring, abnormal foveal contour, no IRF, no SRF, subretinal hyper-reflective material, outer retinal atrophy, central retinal atrophy. Left Eye Quality was good. Scan locations included subfoveal. Central Foveal Thickness: 283. Progression has been stable. Findings include no IRF, no SRF, abnormal foveal  contour, subretinal hyper-reflective material, central retinal atrophy, outer retinal atrophy, inner retinal atrophy. Notes OD macular findings stable with subretinal scarring, last injection was done 9 months previous Bilateral dry ARMD, no effective therapy at this time.  Bilateral subfoveal disciform limit vision as well but no active disease at this time.    CBC Recent Labs  Lab 06/20/20 1730 06/21/20 0603  WBC 5.2 4.7  HGB 11.1* 10.2*  HCT 34.4* 31.1*  PLT 285 271  MCV 95.8 94.8  MCH 30.9 31.1  MCHC 32.3 32.8  RDW 15.4 15.3  LYMPHSABS 1.1  --   MONOABS 0.6  --   EOSABS 0.3  --   BASOSABS 0.0  --     Chemistries  Recent Labs  Lab 06/20/20 1422 06/20/20 1730 06/21/20 1025  NA  --  132* 139  K  --  3.6 4.9  CL  --  100 106  CO2  --  25 28  GLUCOSE  --  204* 118*  BUN  --  16 11  CREATININE  --  1.08* 1.07*  CALCIUM  --  8.4* 8.8*  MG 2.0  --   --   AST  --  19  --   ALT  --  13  --   ALKPHOS  --  73  --   BILITOT  --  0.6  --    ------------------------------------------------------------------------------------------------------------------ No results for input(s): CHOL, HDL, LDLCALC, TRIG, CHOLHDL, LDLDIRECT in the last 72 hours.  Lab Results  Component Value Date   HGBA1C 6.6 08/15/2019   ------------------------------------------------------------------------------------------------------------------ No results for input(s): TSH, T4TOTAL, T3FREE, THYROIDAB in the last 72 hours.  Invalid input(s):  FREET3 ------------------------------------------------------------------------------------------------------------------ No results for input(s): VITAMINB12, FOLATE, FERRITIN, TIBC, IRON, RETICCTPCT in the last 72 hours.  Coagulation profile Recent Labs  Lab 06/20/20 1730  INR 1.2    No results for input(s): DDIMER in the last 72 hours.  Cardiac Enzymes No results for input(s): CKMB, TROPONINI, MYOGLOBIN in the last 168 hours.  Invalid input(s): CK ------------------------------------------------------------------------------------------------------------------    Component Value Date/Time   BNP 109.0 (H) 11/14/2019 2157     Roxan Hockey M.D on 06/22/2020 at 4:07 PM  Go to [email or web address] - for contact info  [facility] - Office  [phone]

## 2020-06-22 NOTE — Consult Note (Signed)
[facility] Nurse Consult Note: Patient receiving care in [phone] Per [place] with Bedside RN Raquel Sarna, this patient is ambulatory and doesn't feel that she needs Prevalon boots. The wound is stable and she is getting ABX for the cellulitis Reason for Consult: Left big toe wound Wound type:  Cellulitis of the left leg. Tip of the left great toe is a diabetic ulcer that is partially necrotic with pink granulation tissue surrounding (See photo). Flow sheet is documented as 100% pink granulation tissue.  Imaging shows no osteomyelitis. Pressure Injury POA: NA Measurement: See flow sheet Periwound: Dry crusted Dressing procedure/placement/frequency: Paint the left great toe with betadine and allow to air dry. Apply daily.   Monitor the wound area(s) for worsening of condition such as: Signs/symptoms of infection, increase in size, development of or worsening of odor, development of pain, or increased pain at the affected locations.   Notify the medical team if any of these develop.  Thank you for the consult. [place] nurse will not follow at this time.   Please re-consult the [place] team if needed.  Cathlean Marseilles Tamala Julian, MSN, RN, [place], Lysle Pearl, [facility] Wound Treatment Associate Pager [phone]

## 2020-06-23 DIAGNOSIS — L02416 Cutaneous abscess of left lower limb: Secondary | ICD-10-CM

## 2020-06-23 LAB — GLUCOSE, CAPILLARY
Glucose-Capillary: 104 mg/dL — ABNORMAL HIGH (ref 70–99)
Glucose-Capillary: 92 mg/dL (ref 70–99)

## 2020-06-23 MED ORDER — POLYETHYLENE GLYCOL 3350 17 G PO PACK: 17.0000 g | PACK | Freq: Two times a day (BID) | ORAL | 2 refills | Status: DC

## 2020-06-23 MED ORDER — CEFDINIR 300 MG PO CAPS: 300.0000 mg | ORAL_CAPSULE | Freq: Two times a day (BID) | ORAL | 0 refills | Status: AC

## 2020-06-23 MED ORDER — SENNOSIDES-DOCUSATE SODIUM 8.6-50 MG PO TABS: 2.0000 | ORAL_TABLET | Freq: Two times a day (BID) | ORAL | 3 refills | Status: DC

## 2020-06-23 MED ORDER — ISOSORBIDE MONONITRATE ER 30 MG PO TB24: 30.0000 mg | ORAL_TABLET | Freq: Every day | ORAL | 2 refills | Status: DC

## 2020-06-23 MED ORDER — DOXYCYCLINE HYCLATE 100 MG PO TABS
100.0000 mg | ORAL_TABLET | Freq: Two times a day (BID) | ORAL | 0 refills | Status: DC
Start: 2020-06-23 — End: 2020-06-29

## 2020-06-23 NOTE — Discharge Summary (Signed)
[name], is a 87 y.o. female  DOB August 10, 1931  MRN [id number].  Admission date:  06/20/2020  Admitting Physician  Roxan Hockey, MD  Discharge Date:  06/23/2020   Primary MD  [name], DO  Recommendations for primary care physician for things to follow:   1)Wound Care---Paint the left great toe with betadine and allow to air dry. Apply non-adhesive dressing and gauze wrap daily  2)follow up with your podiatrist within a week for recheck and reevaluation  3) please take antibiotics as prescribed for left foot infection  4) please reschedule your cardiology appointment--  5)Please use CAM Walker boot/orthopedic shoe--to avoid putting weight on the left great toe   Admission Diagnosis  Cellulitis of left leg [L03.116] Cellulitis and abscess of left lower extremity [L03.116, L02.416]   Discharge Diagnosis  Cellulitis of left leg [L03.116] Cellulitis and abscess of left lower extremity [L03.116, L02.416]    Principal Problem:   Cellulitis and abscess of left lower extremity Active Problems:   Cellulitis of left leg   ICD (implantable cardioverter-defibrillator) in place   History of DVT in the past, on eliquis now   CAD (coronary artery disease)   Chronic sinus bradycardia   Paroxysmal atrial fibrillation, chads2 Vasc2 score of 5, on eliquis   Diabetes mellitus type 2, diet-controlled ([place])   Essential hypertension   Persistent atrial fibrillation ([place])      Past Medical History:  Diagnosis Date   AICD (automatic cardioverter/defibrillator) present    Allergy    SESONAL   Anxiety    ARTHRITIS    Arthritis    Atrial fibrillation ([place])    CAD (coronary artery disease)    a. history of cardiac arrest 1993;  b. s/p LAD/LCX stenting in 2003;  c. 07/2011 Cath: LM nl, LAD patent stent, D1 80ost, LCX patent stent, RCA min irregs;  d. 04/2012 MV: EF 66%, no ishcemia;  e. 12/2013  Echo: Ef 55%, no rwma, Gr 1 DD, triv AI/MR, mildly dil LA;  f. 12/2013 Lexi MV: intermediate risk - apical ischemia and inf/infsept fixed defect, ? artifactual.   CAD in native artery 12/31/2013   Previous stents to LAD and Ramus patent on cath today 12/31/13 also There is severe disease in the ostial first diagonal which is unchanged from most recent cardiac catheterization. The right coronary artery could not be engaged selectively but nonselective angiography showed no significant disease in the proximal and midsegment.       Cataract    DENIES   Chronic back pain    Chronic sinus bradycardia    CKD (chronic kidney disease), stage III (HCC)    Clotting disorder ([place])    DVT   COLITIS 12/02/2007   Qualifier: Diagnosis of  By: [name], [name]     Diabetes mellitus without complication ([place])    DENIES   Diverticulosis of colon (without mention of hemorrhage) 2007   Colonoscopy    Esophageal stricture    a. 2012 s/p dil.   Esophagitis, unspecified  a. 2012 EGD   EXTERNAL HEMORRHOIDS    GERD (gastroesophageal reflux disease)    omeprazole   H/O hiatal hernia    Hiatal hernia    a. 2012 EGD.   History of DVT in the past, not on Coumadin now    left  leg   HYPERCHOLESTEROLEMIA    Hypertension    ICD (implantable cardiac defibrillator) in place    a. s/p initial ICD in 1993 in setting of cardiac arrest;  b. 01/2007 gen change: Guidant T135 Vitality DS VR single lead ICD.   Macular degeneration    gets injecton in eye every 5 weeks- last injection - 05/03/2013    Myocardial infarction [facility]) 1993   Near syncope 05/22/2015   Nephrolithiasis, just saw Dr [name]    Orthostatic hypotension    Peripheral vascular disease ([place])    ???   RECTAL BLEEDING 12/03/2007   Qualifier: Diagnosis of  By: [name]    Unspecified gastritis and gastroduodenitis without mention of hemorrhage    a. 2003 EGD->not noted on 2012 EGD.   Unstable angina, neg MI, cath  stable.maybe GI 12/30/2013    Past Surgical History:  Procedure Laterality Date   BREAST BIOPSY Left 11/2013   CARDIAC CATHETERIZATION     CARDIAC CATHETERIZATION  01/01/15   patent stents -there is severe disease in ostial 1st diag but without change.    CARDIAC DEFIBRILLATOR PLACEMENT  02/05/2007   Guidant   CARDIOVASCULAR STRESS TEST  12/30/2013   abnormal   CARDIOVERSION N/A 02/13/2019   Procedure: CARDIOVERSION;  Surgeon: Sanda Klein, MD;  Location: [place];  Service: Cardiovascular;  Laterality: N/A;   CATARACT EXTRACTION Right    Dr. Katy Fitch   COLONOSCOPY     COLONOSCOPY WITH PROPOFOL N/A 03/30/2020   Procedure: COLONOSCOPY WITH PROPOFOL;  Surgeon: Harvel Quale, MD;  [facility];  Service: Gastroenterology;  Laterality: N/A;   coronary stents      CYSTOSCOPY WITH URETEROSCOPY Right 05/20/2013   Procedure: CYSTOSCOPY, RIGHT URETEROSCOPY STONE EXTRACTION, Insertion of right DOUBLE J STENT ;  Surgeon: Irine Seal, MD;  Location: [facility];  Service: Urology;  Laterality: Right;   CYSTOSCOPY WITH URETEROSCOPY AND STENT PLACEMENT N/A 06/03/2013   Procedure: SECOND LOOK CYSTOSCOPY WITH URETEROSCOPY  HOLMIUM LASER LITHO AND STONE EXTRACTION Sammie Bench ;  Surgeon: Malka So, MD;  Location: [facility];  Service: Urology;  Laterality: N/A;   ESOPHAGOGASTRODUODENOSCOPY (EGD) WITH PROPOFOL N/A 11/20/2019   Procedure: ESOPHAGOGASTRODUODENOSCOPY (EGD) WITH PROPOFOL;  Surgeon: Daneil Dolin, MD;  [facility];  Service: Endoscopy;  Laterality: N/A;   GIVENS CAPSULE STUDY N/A 10/30/2016   Procedure: GIVENS CAPSULE STUDY;  Surgeon: Irene Shipper, MD;  Location: [facility] ENDOSCOPY;  Service: Endoscopy;  Laterality: N/A;  NEEDS TO BE ADMITTED FOR OBSERVATION PT. HAS DEFIB   HEMORROIDECTOMY  80's   HOLMIUM LASER APPLICATION Right [id number]   Procedure: HOLMIUM LASER APPLICATION;  Surgeon: Irine Seal, MD;  Location: [facility];  Service: Urology;  Laterality: Right;    HYSTEROSCOPY WITH D & C  09/02/2010   Procedure: DILATATION AND CURETTAGE (D&C) /HYSTEROSCOPY;  Surgeon: Margarette Asal;  Location: [place] [facility];  Service: Gynecology;  Laterality: N/A;  Dilation and Curettage with Hysteroscopy and Polypectomy   IMPLANTABLE CARDIOVERTER DEFIBRILLATOR (ICD) GENERATOR CHANGE N/A 02/10/2014   Procedure: ICD GENERATOR CHANGE;  Surgeon: Sanda Klein, MD;  Location: [place] CATH LAB;  Service: Cardiovascular;  Laterality: N/A;   LEFT HEART  CATHETERIZATION WITH CORONARY ANGIOGRAM N/A 07/28/2011   Procedure: LEFT HEART CATHETERIZATION WITH CORONARY ANGIOGRAM;  Surgeon: Lorretta Harp, MD;  Location: [facility];  Service: Cardiovascular;  Laterality: N/A;   LEFT HEART CATHETERIZATION WITH CORONARY ANGIOGRAM N/A 12/31/2013   Procedure: LEFT HEART CATHETERIZATION WITH CORONARY ANGIOGRAM;  Surgeon: Wellington Hampshire, MD;  Location: [place] CATH LAB;  Service: Cardiovascular;  Laterality: N/A;       HPI  from the history and physical done on the day of admission:    Chief Complaint: Leg swelling   HPI: [name] is a 87 y.o. female with medical history significant for atrial fibrillation and DVT, coronary artery disease with AICD. Patient presented to the ED with complaints of left leg swelling and pain worsening over the past 2 days.  She reports noticed swelling initially 2 days ago.  She follows with a podiatrist for an ulcer underneath her left big toe, she has not been on antibiotics for it.   She reports intermittent chest pain unrelated to activity started today.  Chest pain is present at rest, described as heaviness, left-sided, nonradiating, and self resolved without intervention.  No associated difficulty breathing, no nausea no vomiting.  She reports compliance with her Eliquis.   Patient also reports right-sided abdominal pain yesterday, that started about 1 hour after eating breakfast at about 10:30 AM.  Patient was admitted in March 2022 for same abdominal pain, CTA  and EGD were both unremarkable, normal HIDA scan, GI thought felt pain was intestinal angina, microvascular in etiology.  Patient reports she has had recurrence of the same abdominal pain.  She reports poor oral intake due to abdominal pain and weight loss.   ED Course: Temperature 98.8.  Stable vitals.  WBC 5.2.  Lactic acid 2> 1.2.  Chest x-ray shows chronic interstitial disease.  Left foot x-ray without acute abnormality.  IV ceftriaxone and 500 mill bolus given.  Hospitalist to admit     Hospital Course:     Brief Summary:-  87 y.o. female with medical history significant for atrial fibrillation and DVT, coronary artery disease with AICD admitted with left foot and left lower extremity cellulitis after failing outpatient management by her podiatrist   A/p 1)Lt big toe/left foot and left lower extremity cellulitis--- failed conservative outpatient management by podiatrist of left big toe ulcer/wound -Overall improving -Wound care consult appreciated-recommends painting the left great toe with betadine and allow to air dry. Apply daily.  -Treated with IV Rocephin and doxycycline for community MRSA coverage -Overall much improved, okay to discharge on Omnicef and doxycycline   2)CAD/AICD in situ--status post prior stenting 1993 - -EKG is nonacute -Ruled out for ACS by cardiac enzymes and EKG -no further chest pains, echo from November 2021 with preserved EF of 55% with grade 1 diastolic dysfunction, -Not on aspirin due to need for Eliquis for DVT treatment -Continue isosorbide   3) hypothyroidism--- continue atorvastatin   4)GERD--continue PPI has had extensive GI work-up in the recent past   5)H/o DVT--continue Eliquis   Disposition/--Home    Disposition: The patient is from: Home              Anticipated d/c is to: Home               Code Status :  -  Code Status: DNR    Discharge Condition: stable  Follow UP   Follow-up Information     [facility]  Follow up.  Specialty: Home Health Services Why:   RN/PT will call to schedule first home vist 48 hours after discharge.         Ronnie Doss M, DO. Schedule an appointment as soon as possible for a visit in 1 week(s).   Specialty: Family Medicine Contact information: [place] [address] [phone]         Croitoru, Dani Gobble, MD. Schedule an appointment as soon as possible for a visit in 1 week(s).   Specialty: Cardiology Contact information: [address] [place] [address] [phone]                  Consults obtained -   Diet and Activity recommendation:  As advised  Discharge Instructions    Discharge Instructions     Apply cam walker   Complete by: As directed    CAM Walker boot/orthopedic shoe   Laterality: Left   Call MD for:  difficulty breathing, headache or visual disturbances   Complete by: As directed    Call MD for:  persistant dizziness or light-headedness   Complete by: As directed    Call MD for:  persistant nausea and vomiting   Complete by: As directed    Call MD for:  redness, tenderness, or signs of infection (pain, swelling, redness, odor or green/yellow discharge around incision site)   Complete by: As directed    Call MD for:  severe uncontrolled pain   Complete by: As directed    Call MD for:  temperature >100.4   Complete by: As directed    Diet - low sodium heart healthy   Complete by: As directed    Discharge instructions   Complete by: As directed    1)Wound Care---Paint the left great toe with betadine and allow to air dry. Apply non-adhesive dressing and gauze wrap daily  2)follow up with your podiatrist within a week for recheck and reevaluation  3) please take antibiotics as prescribed for left foot infection  4) please reschedule your cardiology appointment--  5)Please use CAM Walker boot/orthopedic shoe--to avoid putting weight on the left great toe   Discharge wound care:    Complete by: As directed    1)Wound Care---Paint the left great toe with betadine and allow to air dry. Apply non-adhesive dressing and gauze wrap daily   Increase activity slowly   Complete by: As directed         Discharge Medications     Allergies as of 06/23/2020       Reactions   Sotalol Other (See Comments)   Torsades   Tramadol Other (See Comments)   Hallucination, bad-dream   Actos [pioglitazone] Swelling   Adhesive [tape] Other (See Comments)   Causes sores   Atorvastatin Other (See Comments)   myalgia   Bactrim [sulfamethoxazole-trimethoprim] Itching, Rash   Itchy rash   Ciprofloxacin Nausea Only   Contrast Media [iodinated Diagnostic Agents] Other (See Comments)   Headache (no action/pre-med required)   Latex Rash   Pedi-pre Tape Spray [wound Dressing Adhesive] Other (See Comments)   Causes sores   Sulfa Antibiotics Other (See Comments)   Causes sores        Medication List     STOP taking these medications    docusate sodium 100 MG capsule Commonly known as: COLACE       TAKE these medications    acetaminophen 500 MG tablet Commonly known as: TYLENOL Take 1 tablet (500 mg total) by mouth every  6 (six) hours as needed for mild pain, fever or headache.   amiodarone 200 MG tablet Commonly known as: PACERONE Take 200 mg by mouth daily.   apixaban 2.5 MG Tabs tablet Commonly known as: ELIQUIS Take 1 tablet (2.5 mg total) by mouth 2 (two) times daily.   carboxymethylcellulose 0.5 % Soln Commonly known as: REFRESH PLUS Place 1 drop into both eyes 3 (three) times daily as needed (dry/irritated eyes.).   cefdinir 300 MG capsule Commonly known as: OMNICEF Take 1 capsule (300 mg total) by mouth 2 (two) times daily for 7 days. Start taking on: June 24, 2020   CENTRUM SILVER ULTRA WOMENS PO Take 1 tablet by mouth every morning.   doxycycline 100 MG tablet Commonly known as: VIBRA-TABS Take 1 tablet (100 mg total) by mouth 2 (two) times daily  for 7 days.   feeding supplement Liqd Take 237 mLs by mouth 2 (two) times daily between meals.   HYDROcodone-acetaminophen 5-325 MG tablet Commonly known as: NORCO/VICODIN TAKE 1 TABLET EVERY 4 HOURS AS NEEDED FOR MODERATE PAIN What changed: See the new instructions.   isosorbide mononitrate 30 MG 24 hr tablet Commonly known as: IMDUR Take 1 tablet (30 mg total) by mouth daily. What changed: See the new instructions.   levothyroxine 100 MCG tablet Commonly known as: SYNTHROID Take 1 tablet (100 mcg total) by mouth daily. What changed: when to take this   loratadine 10 MG tablet Commonly known as: CLARITIN Take 1 tablet (10 mg total) by mouth daily as needed for allergies, rhinitis or itching. (for itching)   MELATONIN PO Take 3 mg by mouth at bedtime.   metoprolol tartrate 25 MG tablet Commonly known as: LOPRESSOR Take 0.5 tablets (12.5 mg total) by mouth 2 (two) times daily. HOLD FOR SBP<95 OR HR<65   nitroGLYCERIN 0.4 MG SL tablet Commonly known as: NITROSTAT Place 1 tablet (0.4 mg total) under the tongue every 5 (five) minutes as needed for chest pain.   pantoprazole 40 MG tablet Commonly known as: PROTONIX Take 1 tablet (40 mg total) by mouth 2 (two) times daily before a meal. What changed: when to take this   PARoxetine 20 MG tablet Commonly known as: PAXIL TAKE (1) TABLET DAILY IN THE MORNING. What changed: See the new instructions.   polyethylene glycol 17 g packet Commonly known as: MIRALAX / GLYCOLAX Take 17 g by mouth 2 (two) times daily.   rosuvastatin 10 MG tablet Commonly known as: CRESTOR Take 1 tablet (10 mg total) by mouth daily. What changed: when to take this   senna-docusate 8.6-50 MG tablet Commonly known as: Senokot-S Take 2 tablets by mouth 2 (two) times daily.   Vitamin D 50 MCG (2000 UT) Caps Take 2,000 Units by mouth every morning.               Discharge Care Instructions  (From admission, onward)           Start      Ordered   06/23/20 0000  Discharge wound care:       Comments: 1)Wound Care---Paint the left great toe with betadine and allow to air dry. Apply non-adhesive dressing and gauze wrap daily   06/23/20 1102            Major procedures and Radiology Reports - PLEASE review detailed and final reports for all details, in brief -   CT ABDOMEN PELVIS WO CONTRAST  Result Date: 06/03/2020 CLINICAL DATA:  87 year old female with abdominal pain and  malaise. EXAM: CT ABDOMEN AND PELVIS WITHOUT CONTRAST TECHNIQUE: Multidetector CT imaging of the abdomen and pelvis was performed following the standard protocol without IV contrast. COMPARISON:  CT Abdomen and Pelvis 03/28/2020 and earlier. FINDINGS: Lower chest: Cardiomegaly and cardiac pacemaker leads. Larger lung volumes with mild to moderate bibasilar lung scarring. Subcutaneous electrical leads continue from the left chest to the upper abdomen. Hepatobiliary: Hyperdense liver which might be sequelae of amiodarone therapy. Otherwise negative liver and gallbladder. Pancreas: Negative. Spleen: Negative. Adrenals/Urinary Tract: Normal adrenal glands. Extensive medullary calcinosis with moderate atrophy of the left kidney. Stable kidneys and ureters since March with no obstructive uropathy. Moderately distended urinary bladder similar to March (370 mL). No bladder wall thickening or perivesical stranding. Stomach/Bowel: Redundant large bowel with retained stool and intermittent diverticulosis. Oral contrast has reached the descending colon without evidence of obstruction. No large bowel inflammation. Gas and contrast containing right colon. Normal appendix terminating on coronal image 46. Decompressed and negative terminal ileum. No dilated small bowel. Decompressed stomach and duodenum. No free air, free fluid. Vascular/Lymphatic: Aortoiliac calcified atherosclerosis. Normal caliber abdominal aorta. Vascular patency is not evaluated in the absence of IV contrast.  Reproductive: Negative noncontrast appearance. Other: No pelvic free fluid. Musculoskeletal: Moderate lumbar scoliosis with advanced disc and endplate degeneration. Mild T12 and L1 compression fractures appear stable. No acute osseous abnormality identified. IMPRESSION: 1. No acute or inflammatory process identified in the abdomen or pelvis. 2. Extensive chronic nephrolithiasis and/or medullary calcinosis with no obstructive uropathy today. Chronic left renal atrophy. Continued bladder distension (370 mL), query urinary retention. 3. Cardiomegaly. Redundant large bowel with intermittent diverticula. Aortic Atherosclerosis (ICD10-I70.0). Electronically Signed   By: Genevie Ann M.D.   On: 06/03/2020 06:57   US Venous Img Lower Unilateral Left (DVT)  Result Date: 06/21/2020 CLINICAL DATA:  Leg swelling EXAM: Left LOWER EXTREMITY VENOUS DOPPLER ULTRASOUND TECHNIQUE: Gray-scale sonography with compression, as well as color and duplex ultrasound, were performed to evaluate the deep venous system(s) from the level of the common femoral vein through the popliteal and proximal calf veins. COMPARISON:  None. FINDINGS: VENOUS Normal compressibility of the common femoral, superficial femoral, and popliteal veins, as well as the visualized calf veins. Visualized portions of profunda femoral vein and great saphenous vein unremarkable. No filling defects to suggest DVT on grayscale or color Doppler imaging. Doppler waveforms show normal direction of venous flow, normal respiratory plasticity and response to augmentation. Limited views of the contralateral common femoral vein are unremarkable. OTHER Mild subcutaneous edema in the left lower leg/left ankle. Limitations: none IMPRESSION: No evidence of DVT in the left lower extremity. Mild subcutaneous edema in the left lower leg/left ankle. Electronically Signed   By: Maurine Simmering   On: 06/21/2020 13:58   DG Chest Port 1 View  Result Date: 06/20/2020 CLINICAL DATA:  Questionable  sepsis. Nausea. Lower extremity swelling. EXAM: PORTABLE CHEST 1 VIEW COMPARISON:  Chest x-ray dated 12/22/2019. FINDINGS: Heart size and mediastinal contours are stable. LEFT chest wall pacemaker/ICD apparatus in place. Coarse lung markings bilaterally, grossly stable. No confluent opacity to suggest a developing pneumonia. No pleural effusion or pneumothorax is seen. Osseous structures about the chest are unremarkable. IMPRESSION: 1. No active disease. No evidence of pneumonia or pulmonary edema. 2. Chronic interstitial lung disease. Electronically Signed   By: Franki Cabot M.D.   On: 06/20/2020 17:57   DG Abdomen Acute W/Chest  Result Date: 06/03/2020 CLINICAL DATA:  chest and abdominal pain EXAM: DG ABDOMEN ACUTE WITH 1 VIEW  CHEST COMPARISON:  CT March 28, 2020 FINDINGS: There is no evidence of dilated bowel loops or free intraperitoneal air. Gas visualized over the rectal vault. Bilateral renal calculi. Catheter tubing overlies the pelvis. Aortic atherosclerosis. The bow convex curvature of the lumbar spine with multilevel degenerative changes spine. Left chest generator device with 3 leads overlying the cardiac silhouette and additional leads overlie the mid abdomen. Borderline cardiac enlargement. Chronic bronchitic lung changes. No focal consolidation. No visible pleural effusion or pneumothorax. IMPRESSION: 1. Nonobstructive bowel gas pattern. 2. Bilateral renal calculi. 3. No acute cardiopulmonary disease. Electronically Signed   By: Dahlia Bailiff MD   On: 06/03/2020 01:30   DG Foot 2 Views Left  Result Date: 06/20/2020 CLINICAL DATA:  LEFT lower extremity swelling. EXAM: LEFT FOOT - 2 VIEW COMPARISON:  None. FINDINGS: Osseous alignment is normal. No fracture line or displaced fracture fragment. No acute-appearing cortical irregularity or osseous lesion. Mild degenerative spurring within the midfoot and hindfoot. Soft tissues about the LEFT foot are unremarkable. IMPRESSION: 1. No acute findings.  2. Mild degenerative spurring within the midfoot and hindfoot. Electronically Signed   By: Franki Cabot M.D.   On: 06/20/2020 17:59   OCT, Retina - OU - Both Eyes  Result Date: 06/09/2020 Right Eye Quality was good. Scan locations included subfoveal. Central Foveal Thickness: 269. Progression has been stable. Findings include subretinal scarring, abnormal foveal contour, no IRF, no SRF, subretinal hyper-reflective material, outer retinal atrophy, central retinal atrophy. Left Eye Quality was good. Scan locations included subfoveal. Central Foveal Thickness: 283. Progression has been stable. Findings include no IRF, no SRF, abnormal foveal contour, subretinal hyper-reflective material, central retinal atrophy, outer retinal atrophy, inner retinal atrophy. Notes OD macular findings stable with subretinal scarring, last injection was done 9 months previous Bilateral dry ARMD, no effective therapy at this time.  Bilateral subfoveal disciform limit vision as well but no active disease at this time.   Micro Results   Recent Results (from the past 240 hour(s))  Blood Culture (routine x 2)     Status: None (Preliminary result)   Collection Time: 06/20/20  5:00 PM   Specimen: Left Antecubital; Blood  Result Value Ref Range Status   Specimen Description LEFT ANTECUBITAL  Final   Special Requests   Final    BOTTLES DRAWN AEROBIC AND ANAEROBIC Blood Culture adequate volume   Culture   Final    NO GROWTH 3 DAYS Performed at [facility], [address], [place], [place] [address]    Report Status PENDING  Incomplete  Blood Culture (routine x 2)     Status: None (Preliminary result)   Collection Time: 06/20/20  5:36 PM   Specimen: BLOOD LEFT WRIST  Result Value Ref Range Status   Specimen Description BLOOD LEFT WRIST  Final   Special Requests   Final    BOTTLES DRAWN AEROBIC AND ANAEROBIC Blood Culture adequate volume   Culture   Final    NO GROWTH 3 DAYS Performed at [facility], [address], [place], [place] [address]    Report Status PENDING  Incomplete  SARS CORONAVIRUS 2 (TAT 6-24 HRS) Nasopharyngeal Nasopharyngeal Swab     Status: None   Collection Time: 06/20/20  7:37 PM   Specimen: Nasopharyngeal Swab  Result Value Ref Range Status   SARS Coronavirus 2 NEGATIVE NEGATIVE Final    Comment: (NOTE) SARS-CoV-2 target nucleic acids are NOT DETECTED.  The SARS-CoV-2 RNA is generally detectable in upper and lower respiratory specimens during  the acute phase of infection. Negative results do not preclude SARS-CoV-2 infection, do not rule out co-infections with other pathogens, and should not be used as the sole basis for treatment or other patient management decisions. Negative results must be combined with clinical observations, patient history, and epidemiological information. The expected result is Negative.  Fact Sheet for Patients: SugarRoll.be  Fact Sheet for Healthcare Providers: [email or web address]  This test is not yet approved or cleared by the [place] FDA and  has been authorized for detection and/or diagnosis of SARS-CoV-2 by FDA under an Emergency Use Authorization (EUA). This EUA will remain  in effect (meaning this test can be used) for the duration of the COVID-19 declaration under Se ction 564(b)(1) of the Act, 21 U.S.C. section 360bbb-3(b)(1), unless the authorization is terminated or revoked sooner.  Performed at [facility], [place] [address] [place], [place] [address]   Urine culture     Status: Abnormal   Collection Time: 06/20/20  8:31 PM   Specimen: In/Out Cath Urine  Result Value Ref Range Status   Specimen Description   Final    IN/OUT CATH URINE Performed at [facility], [address], [place], [place] [address]    Special Requests   Final    NONE Performed at [facility], [address] [place], [place] [address]    Culture MULTIPLE SPECIES PRESENT, SUGGEST  RECOLLECTION (A)  Final   Report Status 06/22/2020 FINAL  Final       Today   Subjective    [place] today has no new complaints No fever  Or chills  -- Left foot and leg much less swelling, able to put weight on it           Patient has been seen and examined prior to discharge   Objective   Blood pressure (!) 158/69, pulse (!) 53, temperature 98.4 F (36.9 C), resp. rate 18, height '5\' 3"'$  (1.6 m), weight 56.6 kg, SpO2 97 %.   Intake/Output Summary (Last 24 hours) at 06/23/2020 1102 Last data filed at 06/23/2020 0735 Gross per 24 hour  Intake 900 ml  Output --  Net 900 ml    Exam Gen:- Awake Alert,  In no apparent distress HEENT:- [place].AT, No sclera icterus Neck-Supple Neck,No JVD,. Lungs-  CTAB , fair symmetrical air movement CV- S1, S2 normal, regular Abd-  +ve B.Sounds, Abd Soft, No tenderness,    Extremity/Skin:-, pedal pulses present , left great toe on left foot and left leg cellulitis please see photos in epic--- overall much improved erythema, swelling , no significant tenderness Psych-affect is appropriate, oriented x3 Neuro-no new focal deficits, no tremors     Data Review   CBC w Diff:  Lab Results  Component Value Date   WBC 4.7 06/21/2020   HGB 10.2 (L) 06/21/2020   HGB 12.3 02/05/2020   HCT 31.1 (L) 06/21/2020   HCT 36.8 02/05/2020   PLT 271 06/21/2020   PLT 223 02/05/2020   LYMPHOPCT 20 06/20/2020   MONOPCT 12 06/20/2020   EOSPCT 5 06/20/2020   BASOPCT 1 06/20/2020    CMP:  Lab Results  Component Value Date   NA 139 06/21/2020   NA 137 03/23/2020   K 4.9 06/21/2020   CL 106 06/21/2020   CO2 28 06/21/2020   BUN 11 06/21/2020   BUN 12 03/23/2020   CREATININE 1.07 (H) 06/21/2020   CREATININE 1.52 (H) 07/31/2012   PROT 6.5 06/20/2020   PROT 6.4 03/23/2020  ALBUMIN 3.0 (L) 06/20/2020   ALBUMIN 3.4 (L) 03/23/2020   BILITOT 0.6 06/20/2020   BILITOT 0.5 03/23/2020   ALKPHOS 73 06/20/2020   AST 19 06/20/2020   ALT 13 06/20/2020   .   Total Discharge time is about 33 minutes  Roxan Hockey M.D on 06/23/2020 at 11:02 AM  Go to [email or web address] -  for contact info  [facility] - Office  [phone]

## 2020-06-23 NOTE — TOC Transition Note (Signed)
Transition of Care [facility]) - CM/SW Discharge Note   Patient Details  Name: [name] MRN: [id number] Date of Birth: 1931/02/19  Transition of Care [facility]) CM/SW Contact:  Boneta Lucks, RN Phone Number: 06/23/2020, 12:45 PM   Clinical Narrative:  Patient discharging home today with home health. Linda with [facility] updated with discharge. MD placed orders. Added to AVS.  Final next level of care: [place] Barriers to Discharge: Barriers Resolved   Patient Goals and CMS Choice Patient states their goals for this hospitalization and ongoing recovery are:: to go home. CMS Medicare.gov Compare Post Acute Care list provided to:: Patient Choice offered to / list presented to : Patient  Discharge Placement              Patient chooses bed at:  [facility]) Patient to be transferred to facility by: Family   Patient and family notified of of transfer: 06/23/20  Discharge Plan and Services         HH Arranged: RN,PT [facility] Agency: [place] ([place]) Date [place]: 06/21/20 Time [place]: 1338 Representative spoke with at [place]: [place]   Readmission Risk Interventions Readmission Risk Prevention Plan 06/23/2020 03/28/2020 11/17/2019  Transportation Screening - Complete Complete  Medication Review Press photographer) Complete Complete Complete  PCP or Specialist appointment within 3-5 days of discharge Complete - Complete  HRI or [place] Complete Complete Complete  SW Recovery Care/Counseling Consult Complete Complete Complete  Palliative Care Screening Not Applicable Not Applicable Not [place] Patient Refused Not Applicable Not Applicable  Some recent data might be hidden

## 2020-06-23 NOTE — Discharge Instructions (Signed)
1)Wound Care---Paint the left great toe with betadine and allow to air dry. Apply non-adhesive dressing and gauze wrap daily  2)follow up with your podiatrist within a week for recheck and reevaluation  3) please take antibiotics as prescribed for left foot infection  4) please reschedule your cardiology appointment--  5)Please use CAM Walker boot/orthopedic shoe--to avoid putting weight on the left great toe

## 2020-06-24 ENCOUNTER — Encounter: Payer: Medicare Other | Admitting: Cardiovascular Disease

## 2020-06-24 DIAGNOSIS — L97521 Non-pressure chronic ulcer of other part of left foot limited to breakdown of skin: Secondary | ICD-10-CM | POA: Diagnosis not present

## 2020-06-25 ENCOUNTER — Telehealth: Payer: Self-pay

## 2020-06-25 DIAGNOSIS — Z86718 Personal history of other venous thrombosis and embolism: Secondary | ICD-10-CM | POA: Diagnosis not present

## 2020-06-25 DIAGNOSIS — L03116 Cellulitis of left lower limb: Secondary | ICD-10-CM | POA: Diagnosis not present

## 2020-06-25 DIAGNOSIS — Z95810 Presence of automatic (implantable) cardiac defibrillator: Secondary | ICD-10-CM | POA: Diagnosis not present

## 2020-06-25 DIAGNOSIS — L97528 Non-pressure chronic ulcer of other part of left foot with other specified severity: Secondary | ICD-10-CM | POA: Diagnosis not present

## 2020-06-25 DIAGNOSIS — I25119 Atherosclerotic heart disease of native coronary artery with unspecified angina pectoris: Secondary | ICD-10-CM | POA: Diagnosis not present

## 2020-06-25 DIAGNOSIS — E11621 Type 2 diabetes mellitus with foot ulcer: Secondary | ICD-10-CM | POA: Diagnosis not present

## 2020-06-25 DIAGNOSIS — I4891 Unspecified atrial fibrillation: Secondary | ICD-10-CM | POA: Diagnosis not present

## 2020-06-25 DIAGNOSIS — Z7901 Long term (current) use of anticoagulants: Secondary | ICD-10-CM | POA: Diagnosis not present

## 2020-06-25 LAB — CULTURE, BLOOD (ROUTINE X 2)
Culture: NO GROWTH
Culture: NO GROWTH
Special Requests: ADEQUATE
Special Requests: ADEQUATE

## 2020-06-25 NOTE — Telephone Encounter (Signed)
Transition Care Management Follow-up Telephone Call Date of discharge and from where: [name] 06/23/20 Diagnosis: Cellulitis Left leg How have you been since you were released from the hospital? Very weak, lots of diarrhea, sore on toe not healing well (saw Dr Irving Shows for this yesterday) Any questions or concerns? No  Items Reviewed: Did the pt receive and understand the discharge instructions provided? Yes  Medications obtained and verified? Yes  Other? No  Any new allergies since your discharge? No  Dietary orders reviewed? Yes Do you have support at home? Yes   Home Care and Equipment/Supplies: Were home health services ordered? yes If so, what is the name of the agency? [facility]  Has the agency set up a time to come to the patient's home? yes Were any new equipment or medical supplies ordered?  No What is the name of the medical supply agency? N/a Were you able to get the supplies/equipment? not applicable Do you have any questions related to the use of the equipment or supplies? No  Functional Questionnaire: (I = Independent and D = Dependent) ADLs: I  Bathing/Dressing- I  Meal Prep- I  Eating- I  Maintaining continence- I  Transferring/Ambulation- I  Managing Meds- D  Follow up appointments reviewed:  PCP Hospital f/u appt confirmed? Yes  Scheduled to see Lajuana Ripple on 06/29/20 @ 3:45. This was regular f/u already scheduled - They insisted on keeping appt, although I tried to get them to see NP then or you in 2 weeks when appt could be 30 minutes. [place] [facility] f/u appt confirmed? Yes  Scheduled to see Dr Irving Shows on 07/01/20 Are transportation arrangements needed? No  If their condition worsens, is the pt aware to call PCP or go to the Emergency Dept.? Yes Was the patient provided with contact information for the PCP's office or ED? Yes Was to pt encouraged to call back with questions or concerns? Yes

## 2020-06-29 ENCOUNTER — Encounter: Payer: Self-pay | Admitting: Family Medicine

## 2020-06-29 ENCOUNTER — Other Ambulatory Visit: Payer: Self-pay

## 2020-06-29 ENCOUNTER — Other Ambulatory Visit: Payer: Self-pay | Admitting: Family Medicine

## 2020-06-29 ENCOUNTER — Ambulatory Visit (INDEPENDENT_AMBULATORY_CARE_PROVIDER_SITE_OTHER): Payer: Medicare Other | Admitting: Family Medicine

## 2020-06-29 VITALS — BP 132/79 | HR 66 | Temp 97.9°F | Ht 63.0 in | Wt 124.8 lb

## 2020-06-29 DIAGNOSIS — N1831 Chronic kidney disease, stage 3a: Secondary | ICD-10-CM

## 2020-06-29 DIAGNOSIS — G8929 Other chronic pain: Secondary | ICD-10-CM

## 2020-06-29 DIAGNOSIS — I25119 Atherosclerotic heart disease of native coronary artery with unspecified angina pectoris: Secondary | ICD-10-CM | POA: Diagnosis not present

## 2020-06-29 DIAGNOSIS — M25561 Pain in right knee: Secondary | ICD-10-CM | POA: Diagnosis not present

## 2020-06-29 DIAGNOSIS — L03116 Cellulitis of left lower limb: Secondary | ICD-10-CM

## 2020-06-29 DIAGNOSIS — D649 Anemia, unspecified: Secondary | ICD-10-CM

## 2020-06-29 DIAGNOSIS — Z09 Encounter for follow-up examination after completed treatment for conditions other than malignant neoplasm: Secondary | ICD-10-CM

## 2020-06-29 DIAGNOSIS — D509 Iron deficiency anemia, unspecified: Secondary | ICD-10-CM | POA: Diagnosis not present

## 2020-06-29 DIAGNOSIS — L97528 Non-pressure chronic ulcer of other part of left foot with other specified severity: Secondary | ICD-10-CM | POA: Diagnosis not present

## 2020-06-29 DIAGNOSIS — E11621 Type 2 diabetes mellitus with foot ulcer: Secondary | ICD-10-CM | POA: Diagnosis not present

## 2020-06-29 DIAGNOSIS — R6889 Other general symptoms and signs: Secondary | ICD-10-CM | POA: Diagnosis not present

## 2020-06-29 DIAGNOSIS — I4891 Unspecified atrial fibrillation: Secondary | ICD-10-CM | POA: Diagnosis not present

## 2020-06-29 DIAGNOSIS — Z95810 Presence of automatic (implantable) cardiac defibrillator: Secondary | ICD-10-CM | POA: Diagnosis not present

## 2020-06-29 MED ORDER — DOXYCYCLINE HYCLATE 100 MG PO TABS: 100.0000 mg | ORAL_TABLET | Freq: Two times a day (BID) | ORAL | 0 refills | Status: AC

## 2020-06-29 NOTE — Progress Notes (Signed)
Subjective: CC: Left leg cellulitis PCP: [name], DO [id number] [name] is a 87 y.o. female presenting to clinic today for:  1. Hospital follow up for left leg cellulitis Patient was admitted to the hospital on 06/20/2020 for left leg cellulitis that was refractory to outpatient attempts to treat by podiatry.  She was subsequently treated with Rocephin and doxycycline.  At time of discharge she had no leukocytosis.  She did have a slight drop in her hemoglobin.  She denies bleeding that she knows of.  She continues to have some mild erythema of her left leg and was told it had nothing to do with what is going on in her toe by her podiatrist.  She has follow up with him Thursday and gets dressing changes daily.  She has 1 more day of omnicef and doxycycline.    2. Chronic right knee pain She notes that her right knee is starting to hurt again.  She had a steroid injection done 05/03/20.  She used to see Dr Alvan Dame but has not followed up with him in a while.  ROS: Per HPI  Allergies  Allergen Reactions   Sotalol Other (See Comments)    Torsades    Tramadol Other (See Comments)    Hallucination, bad-dream   Actos [Pioglitazone] Swelling   Adhesive [Tape] Other (See Comments)    Causes sores   Atorvastatin Other (See Comments)    myalgia   Bactrim [Sulfamethoxazole-Trimethoprim] Itching and Rash    Itchy rash   Ciprofloxacin Nausea Only   Contrast Media [Iodinated Diagnostic Agents] Other (See Comments)    Headache (no action/pre-med required)   Latex Rash   Pedi-Pre Tape Spray [Wound Dressing Adhesive] Other (See Comments)    Causes sores   Sulfa Antibiotics Other (See Comments)    Causes sores   Past Medical History:  Diagnosis Date   AICD (automatic cardioverter/defibrillator) present    Allergy    SESONAL   Anxiety    ARTHRITIS    Arthritis    Atrial fibrillation (HCC)    CAD (coronary artery disease)    a. history of cardiac arrest 1993;  b. s/p LAD/LCX  stenting in 2003;  c. 07/2011 Cath: LM nl, LAD patent stent, D1 80ost, LCX patent stent, RCA min irregs;  d. 04/2012 MV: EF 66%, no ishcemia;  e. 12/2013 Echo: Ef 55%, no rwma, Gr 1 DD, triv AI/MR, mildly dil LA;  f. 12/2013 Lexi MV: intermediate risk - apical ischemia and inf/infsept fixed defect, ? artifactual.   CAD in native artery 12/31/2013   Previous stents to LAD and Ramus patent on cath today 12/31/13 also There is severe disease in the ostial first diagonal which is unchanged from most recent cardiac catheterization. The right coronary artery could not be engaged selectively but nonselective angiography showed no significant disease in the proximal and midsegment.       Cataract    DENIES   Chronic back pain    Chronic sinus bradycardia    CKD (chronic kidney disease), stage III (HCC)    Clotting disorder ([place])    DVT   COLITIS 12/02/2007   Qualifier: Diagnosis of  By: [name], [name]     Diabetes mellitus without complication ([place])    DENIES   Diverticulosis of colon (without mention of hemorrhage) 2007   Colonoscopy    Esophageal stricture    a. 2012 s/p dil.   Esophagitis, unspecified    a. 2012 EGD   EXTERNAL  HEMORRHOIDS    GERD (gastroesophageal reflux disease)    omeprazole   H/O hiatal hernia    Hiatal hernia    a. 2012 EGD.   History of DVT in the past, not on Coumadin now    left  leg   HYPERCHOLESTEROLEMIA    Hypertension    ICD (implantable cardiac defibrillator) in place    a. s/p initial ICD in 1993 in setting of cardiac arrest;  b. 01/2007 gen change: Guidant T135 Vitality DS VR single lead ICD.   Macular degeneration    gets injecton in eye every 5 weeks- last injection - 05/03/2013    Myocardial infarction [facility]) 1993   Near syncope 05/22/2015   Nephrolithiasis, just saw Dr [name]    Orthostatic hypotension    Peripheral vascular disease ([place])    ???   RECTAL BLEEDING 12/03/2007   Qualifier: Diagnosis of  By: [name]     Unspecified gastritis and gastroduodenitis without mention of hemorrhage    a. 2003 EGD->not noted on 2012 EGD.   Unstable angina, neg MI, cath stable.maybe GI 12/30/2013    Current Outpatient Medications:    acetaminophen (TYLENOL) 500 MG tablet, Take 1 tablet (500 mg total) by mouth every 6 (six) hours as needed for mild pain, fever or headache., Disp: 30 tablet, Rfl: 0   amiodarone (PACERONE) 200 MG tablet, Take 200 mg by mouth daily., Disp: , Rfl:    apixaban (ELIQUIS) 2.5 MG TABS tablet, Take 1 tablet (2.5 mg total) by mouth 2 (two) times daily., Disp: 60 tablet, Rfl: 5   carboxymethylcellulose (REFRESH PLUS) 0.5 % SOLN, Place 1 drop into both eyes 3 (three) times daily as needed (dry/irritated eyes.)., Disp: , Rfl:    cefdinir (OMNICEF) 300 MG capsule, Take 1 capsule (300 mg total) by mouth 2 (two) times daily for 7 days., Disp: 14 capsule, Rfl: 0   Cholecalciferol (VITAMIN D) 50 MCG (2000 UT) CAPS, Take 2,000 Units by mouth every morning., Disp: , Rfl:    doxycycline (VIBRA-TABS) 100 MG tablet, Take 1 tablet (100 mg total) by mouth 2 (two) times daily for 7 days., Disp: 14 tablet, Rfl: 0   feeding supplement (ENSURE ENLIVE / ENSURE PLUS) LIQD, Take 237 mLs by mouth 2 (two) times daily between meals., Disp: , Rfl:    HYDROcodone-acetaminophen (NORCO/VICODIN) 5-325 MG tablet, TAKE 1 TABLET EVERY 4 HOURS AS NEEDED FOR MODERATE PAIN (Patient not taking: Reported on 06/25/2020), Disp: 30 tablet, Rfl: 0   isosorbide mononitrate (IMDUR) 30 MG 24 hr tablet, Take 1 tablet (30 mg total) by mouth daily., Disp: 90 tablet, Rfl: 2   levothyroxine (SYNTHROID) 100 MCG tablet, Take 1 tablet (100 mcg total) by mouth daily. (Patient taking differently: Take 100 mcg by mouth every morning.), Disp: 30 tablet, Rfl: 5   loratadine (CLARITIN) 10 MG tablet, Take 1 tablet (10 mg total) by mouth daily as needed for allergies, rhinitis or itching. (for itching), Disp: 30 tablet, Rfl: 11   MELATONIN PO, Take 3 mg by  mouth at bedtime., Disp: , Rfl:    metoprolol tartrate (LOPRESSOR) 25 MG tablet, Take 0.5 tablets (12.5 mg total) by mouth 2 (two) times daily. HOLD FOR SBP<95 OR HR<65, Disp: , Rfl:    Multiple Vitamins-Minerals (CENTRUM SILVER ULTRA WOMENS PO), Take 1 tablet by mouth every morning., Disp: , Rfl:    nitroGLYCERIN (NITROSTAT) 0.4 MG SL tablet, Place 1 tablet (0.4 mg total) under the tongue every 5 (five) minutes as  needed for chest pain., Disp: 25 tablet, Rfl: 2   pantoprazole (PROTONIX) 40 MG tablet, Take 1 tablet (40 mg total) by mouth 2 (two) times daily before a meal. (Patient taking differently: Take 40 mg by mouth every morning.), Disp: 90 tablet, Rfl: 0   PARoxetine (PAXIL) 20 MG tablet, TAKE (1) TABLET DAILY IN THE MORNING. (Patient taking differently: Take 20 mg by mouth every morning.), Disp: 90 tablet, Rfl: 0   polyethylene glycol (MIRALAX / GLYCOLAX) 17 g packet, Take 17 g by mouth 2 (two) times daily., Disp: 60 each, Rfl: 2   rosuvastatin (CRESTOR) 10 MG tablet, Take 1 tablet (10 mg total) by mouth daily. (Patient taking differently: Take 10 mg by mouth at bedtime.), Disp: 90 tablet, Rfl: 1   senna-docusate (SENOKOT-S) 8.6-50 MG tablet, Take 2 tablets by mouth 2 (two) times daily. (Patient not taking: Reported on 06/25/2020), Disp: 60 tablet, Rfl: 3 Social History   Socioeconomic History   Marital status: Widowed    Spouse name: Not on file   Number of children: 4   Years of education: 8   Highest education level: 8th grade  Occupational History   Occupation: Retired  Tobacco Use   Smoking status: Never   Smokeless tobacco: Never  Vaping Use   Vaping Use: Never used  Substance and Sexual Activity   Alcohol use: No   Drug use: No   Sexual activity: Never    Birth control/protection: Post-menopausal  Other Topics Concern   Not on file  Social History Narrative   Not on file   Social Determinants of Health   Financial Resource Strain: Not on file  Food Insecurity: Not on  file  Transportation Needs: Not on file  Physical Activity: Not on file  Stress: Not on file  Social Connections: Not on file  Intimate Partner Violence: Not on file   Family History  Problem Relation Age of Onset   Stroke Mother    Other Mother        brain tumor   Other Father        MI   Heart attack Father    Stroke Sister    Macular degeneration Sister    Diabetes Daughter    Cancer Daughter        ovarian   Colon polyps Daughter    Atrial fibrillation Sister    Hyperlipidemia Sister    Osteoporosis Sister    Stroke Sister    Uterine cancer Sister    Heart attack Brother    Heart disease Brother    Asthma Brother    Breast cancer Neg Hx     Objective: Office vital signs reviewed. BP 132/79   Pulse 66   Temp 97.9 F (36.6 C)   Ht '5\' 3"'$  (1.6 m)   Wt 124 lb 12.5 oz (56.6 kg)   BMI 22.10 kg/m   Physical Examination:  General: Awake, alert, nontoxic elderly female, No acute distress HEENT: Normal, sclera white Cardio: bradycardic, S1S2 heard, no murmurs appreciated Pulm: clear to auscultation bilaterally, no wheezes, rhonchi or rales; normal work of breathing on room air Extremities: warm, well perfused, trace LLE edema, no cyanosis or clubbing; +2 pulses bilaterally MSK: left foot is in a post op shoe and bandaged.  She does have mild erythema with associated mild swelling in the left lower leg. No significant warmth.  It is mildly tender to palpation. Right knee with osteoarthritic changes noted.  Assessment/ Plan: 87 y.o. female   Left leg  cellulitis - Plan: doxycycline (VIBRA-TABS) 100 MG tablet  Hospital discharge follow-up  Low hemoglobin - Plan: Anemia Profile B  Chronic kidney disease (CKD) stage G3a/A1, moderately decreased glomerular filtration rate (GFR) between 45-59 mL/min/1.73 square meter and albuminuria creatinine ratio less than 30 mg/g (HCC) - Plan: CBC with Differential/Platelet, Renal Function Panel, PTH, Intact and Calcium, Protein /  Creatinine Ratio, Urine  Chronic pain of right knee - Plan: Ambulatory referral to Orthopedic Surgery  I have reviewed hospitalization.  Discharge summary was not available at time of appointment but it appears she was discharged on dual antibiotics.  I have continued the doxycycline for an additional week due to ongoing erythema in the left leg.  Advised to contact me if symptoms worsen on single therapy.  Keep follow up with Dr Maia Plan placed a referral to Dr Aurea Graff office for consideration of viscosupplementation, as patient's corticosteroid injections are not lasting the full 3 months.  Her labs for Dr Theador Hawthorne were collected for CKD today as well.  No orders of the defined types were placed in this encounter.  No orders of the defined types were placed in this encounter.   Today's visit is for Transitional Care Management.  The patient was discharged from [facility] on 06/23/2020 with a primary diagnosis of Left leg cellulitis.   Contact with the patient and/or caregiver, by a clinical staff member, was made on 06/25/2020 and was documented as a telephone encounter within the EMR.  Through chart review and discussion with the patient I have determined that management of their condition is of moderate complexity.    [name], DO [place] [phone]

## 2020-06-30 LAB — ANEMIA PROFILE B
Basophils Absolute: 0.1 10*3/uL (ref 0.0–0.2)
Basos: 1 %
EOS (ABSOLUTE): 0.2 10*3/uL (ref 0.0–0.4)
Eos: 2 %
Ferritin: 72 ng/mL (ref 15–150)
Folate: >20 ng/mL (ref >3.0)
Hematocrit: 35.3 % (ref 34.0–46.6)
Hemoglobin: 11.8 g/dL (ref 11.1–15.9)
Immature Grans (Abs): 0.1 10*3/uL (ref 0.0–0.1)
Immature Granulocytes: 1 %
Iron Saturation: 35 % (ref 15–55)
Iron: 99 ug/dL (ref 27–139)
Lymphocytes Absolute: 1.3 10*3/uL (ref 0.7–3.1)
Lymphs: 20 %
MCH: 30.6 pg (ref 26.6–33.0)
MCHC: 33.4 g/dL (ref 31.5–35.7)
MCV: 92 fL (ref 79–97)
Monocytes Absolute: 0.7 10*3/uL (ref 0.1–0.9)
Monocytes: 10 %
Neutrophils Absolute: 4.6 10*3/uL (ref 1.4–7.0)
Neutrophils: 66 %
Platelets: 366 10*3/uL (ref 150–450)
RBC: 3.85 x10E6/uL (ref 3.77–5.28)
RDW: 14.7 % (ref 11.7–15.4)
Retic Ct Pct: 2.4 % (ref 0.6–2.6)
Total Iron Binding Capacity: 280 ug/dL (ref 250–450)
UIBC: 181 ug/dL (ref 118–369)
Vitamin B-12: 1331 pg/mL — ABNORMAL HIGH (ref 232–1245)
WBC: 6.8 10*3/uL (ref 3.4–10.8)

## 2020-06-30 LAB — PROTEIN / CREATININE RATIO, URINE
Creatinine, Urine: 81 mg/dL
Protein, Ur: 55.6 mg/dL
Protein/Creat Ratio: 686 mg/g creat — ABNORMAL HIGH (ref 0–200)

## 2020-06-30 LAB — RENAL FUNCTION PANEL
Albumin: 3.8 g/dL (ref 3.6–4.6)
BUN/Creatinine Ratio: 25 (ref 12–28)
BUN: 34 mg/dL — ABNORMAL HIGH (ref 8–27)
CO2: 16 mmol/L — ABNORMAL LOW (ref 20–29)
Calcium: 9.9 mg/dL (ref 8.7–10.3)
Chloride: 102 mmol/L (ref 96–106)
Creatinine, Ser: 1.35 mg/dL — ABNORMAL HIGH (ref 0.57–1.00)
Glucose: 129 mg/dL — ABNORMAL HIGH (ref 65–99)
Phosphorus: 3.2 mg/dL (ref 3.0–4.3)
Potassium: 5.1 mmol/L (ref 3.5–5.2)
Sodium: 134 mmol/L (ref 134–144)
eGFR: 38 mL/min/{1.73_m2} — ABNORMAL LOW (ref >59)

## 2020-06-30 LAB — PTH, INTACT AND CALCIUM: PTH: 28 pg/mL (ref 15–65)

## 2020-07-01 DIAGNOSIS — M79675 Pain in left toe(s): Secondary | ICD-10-CM | POA: Diagnosis not present

## 2020-07-01 DIAGNOSIS — L97521 Non-pressure chronic ulcer of other part of left foot limited to breakdown of skin: Secondary | ICD-10-CM | POA: Diagnosis not present

## 2020-07-02 DIAGNOSIS — I4891 Unspecified atrial fibrillation: Secondary | ICD-10-CM | POA: Diagnosis not present

## 2020-07-02 DIAGNOSIS — L97528 Non-pressure chronic ulcer of other part of left foot with other specified severity: Secondary | ICD-10-CM | POA: Diagnosis not present

## 2020-07-02 DIAGNOSIS — Z95810 Presence of automatic (implantable) cardiac defibrillator: Secondary | ICD-10-CM | POA: Diagnosis not present

## 2020-07-02 DIAGNOSIS — L03116 Cellulitis of left lower limb: Secondary | ICD-10-CM | POA: Diagnosis not present

## 2020-07-02 DIAGNOSIS — I25119 Atherosclerotic heart disease of native coronary artery with unspecified angina pectoris: Secondary | ICD-10-CM | POA: Diagnosis not present

## 2020-07-02 DIAGNOSIS — E11621 Type 2 diabetes mellitus with foot ulcer: Secondary | ICD-10-CM | POA: Diagnosis not present

## 2020-07-06 ENCOUNTER — Emergency Department (HOSPITAL_COMMUNITY)
Admission: EM | Admit: 2020-07-06 | Discharge: 2020-07-06 | Disposition: A | Discharge status: Home / Self Care | Payer: Medicare Other | Attending: Emergency Medicine | Admitting: Emergency Medicine

## 2020-07-06 ENCOUNTER — Emergency Department (HOSPITAL_COMMUNITY): Payer: Medicare Other

## 2020-07-06 ENCOUNTER — Encounter (HOSPITAL_COMMUNITY): Payer: Self-pay | Admitting: *Deleted

## 2020-07-06 ENCOUNTER — Other Ambulatory Visit: Payer: Self-pay

## 2020-07-06 DIAGNOSIS — R6883 Chills (without fever): Secondary | ICD-10-CM | POA: Insufficient documentation

## 2020-07-06 DIAGNOSIS — K5900 Constipation, unspecified: Secondary | ICD-10-CM | POA: Insufficient documentation

## 2020-07-06 DIAGNOSIS — I4891 Unspecified atrial fibrillation: Secondary | ICD-10-CM | POA: Insufficient documentation

## 2020-07-06 DIAGNOSIS — Z8616 Personal history of COVID-19: Secondary | ICD-10-CM | POA: Diagnosis not present

## 2020-07-06 DIAGNOSIS — M7989 Other specified soft tissue disorders: Secondary | ICD-10-CM | POA: Insufficient documentation

## 2020-07-06 DIAGNOSIS — R110 Nausea: Secondary | ICD-10-CM | POA: Insufficient documentation

## 2020-07-06 DIAGNOSIS — Z955 Presence of coronary angioplasty implant and graft: Secondary | ICD-10-CM | POA: Diagnosis not present

## 2020-07-06 DIAGNOSIS — Z79899 Other long term (current) drug therapy: Secondary | ICD-10-CM | POA: Diagnosis not present

## 2020-07-06 DIAGNOSIS — R109 Unspecified abdominal pain: Secondary | ICD-10-CM | POA: Diagnosis not present

## 2020-07-06 DIAGNOSIS — Z91040 Latex allergy status: Secondary | ICD-10-CM | POA: Diagnosis not present

## 2020-07-06 DIAGNOSIS — N183 Chronic kidney disease, stage 3 unspecified: Secondary | ICD-10-CM | POA: Diagnosis not present

## 2020-07-06 DIAGNOSIS — R1010 Upper abdominal pain, unspecified: Secondary | ICD-10-CM | POA: Insufficient documentation

## 2020-07-06 DIAGNOSIS — J3489 Other specified disorders of nose and nasal sinuses: Secondary | ICD-10-CM | POA: Insufficient documentation

## 2020-07-06 DIAGNOSIS — Z7901 Long term (current) use of anticoagulants: Secondary | ICD-10-CM | POA: Insufficient documentation

## 2020-07-06 DIAGNOSIS — E039 Hypothyroidism, unspecified: Secondary | ICD-10-CM | POA: Diagnosis not present

## 2020-07-06 DIAGNOSIS — I129 Hypertensive chronic kidney disease with stage 1 through stage 4 chronic kidney disease, or unspecified chronic kidney disease: Secondary | ICD-10-CM | POA: Diagnosis not present

## 2020-07-06 DIAGNOSIS — K219 Gastro-esophageal reflux disease without esophagitis: Secondary | ICD-10-CM | POA: Diagnosis not present

## 2020-07-06 DIAGNOSIS — I2510 Atherosclerotic heart disease of native coronary artery without angina pectoris: Secondary | ICD-10-CM | POA: Diagnosis not present

## 2020-07-06 DIAGNOSIS — R5383 Other fatigue: Secondary | ICD-10-CM | POA: Insufficient documentation

## 2020-07-06 DIAGNOSIS — L97528 Non-pressure chronic ulcer of other part of left foot with other specified severity: Secondary | ICD-10-CM | POA: Diagnosis not present

## 2020-07-06 DIAGNOSIS — Z20822 Contact with and (suspected) exposure to covid-19: Secondary | ICD-10-CM | POA: Insufficient documentation

## 2020-07-06 DIAGNOSIS — E1122 Type 2 diabetes mellitus with diabetic chronic kidney disease: Secondary | ICD-10-CM | POA: Diagnosis not present

## 2020-07-06 DIAGNOSIS — Z95810 Presence of automatic (implantable) cardiac defibrillator: Secondary | ICD-10-CM | POA: Diagnosis not present

## 2020-07-06 DIAGNOSIS — I25119 Atherosclerotic heart disease of native coronary artery with unspecified angina pectoris: Secondary | ICD-10-CM | POA: Diagnosis not present

## 2020-07-06 DIAGNOSIS — R1084 Generalized abdominal pain: Secondary | ICD-10-CM | POA: Diagnosis not present

## 2020-07-06 DIAGNOSIS — R0602 Shortness of breath: Secondary | ICD-10-CM | POA: Insufficient documentation

## 2020-07-06 DIAGNOSIS — I10 Essential (primary) hypertension: Secondary | ICD-10-CM | POA: Diagnosis not present

## 2020-07-06 DIAGNOSIS — R509 Fever, unspecified: Secondary | ICD-10-CM | POA: Diagnosis not present

## 2020-07-06 DIAGNOSIS — R300 Dysuria: Secondary | ICD-10-CM | POA: Insufficient documentation

## 2020-07-06 DIAGNOSIS — N261 Atrophy of kidney (terminal): Secondary | ICD-10-CM | POA: Diagnosis not present

## 2020-07-06 DIAGNOSIS — N2889 Other specified disorders of kidney and ureter: Secondary | ICD-10-CM | POA: Diagnosis not present

## 2020-07-06 DIAGNOSIS — E11621 Type 2 diabetes mellitus with foot ulcer: Secondary | ICD-10-CM | POA: Diagnosis not present

## 2020-07-06 DIAGNOSIS — L03116 Cellulitis of left lower limb: Secondary | ICD-10-CM | POA: Diagnosis not present

## 2020-07-06 LAB — COMPREHENSIVE METABOLIC PANEL
ALT: 17 U/L (ref 0–44)
AST: 24 U/L (ref 15–41)
Albumin: 3.3 g/dL — ABNORMAL LOW (ref 3.5–5.0)
Alkaline Phosphatase: 87 U/L (ref 38–126)
Anion gap: 7 (ref 5–15)
BUN: 23 mg/dL (ref 8–23)
CO2: 27 mmol/L (ref 22–32)
Calcium: 9.2 mg/dL (ref 8.9–10.3)
Chloride: 99 mmol/L (ref 98–111)
Creatinine, Ser: 1.14 mg/dL — ABNORMAL HIGH (ref 0.44–1.00)
GFR, Estimated: 47 mL/min — ABNORMAL LOW (ref >60)
Glucose, Bld: 139 mg/dL — ABNORMAL HIGH (ref 70–99)
Potassium: 4.3 mmol/L (ref 3.5–5.1)
Sodium: 133 mmol/L — ABNORMAL LOW (ref 135–145)
Total Bilirubin: 0.7 mg/dL (ref 0.3–1.2)
Total Protein: 6.7 g/dL (ref 6.5–8.1)

## 2020-07-06 LAB — CBC WITH DIFFERENTIAL/PLATELET
Abs Immature Granulocytes: 0.02 10*3/uL (ref 0.00–0.07)
Basophils Absolute: 0.1 10*3/uL (ref 0.0–0.1)
Basophils Relative: 1 %
Eosinophils Absolute: 0.2 10*3/uL (ref 0.0–0.5)
Eosinophils Relative: 3 %
HCT: 37.4 % (ref 36.0–46.0)
Hemoglobin: 12.5 g/dL (ref 12.0–15.0)
Immature Granulocytes: 0 %
Lymphocytes Relative: 18 %
Lymphs Abs: 1.2 10*3/uL (ref 0.7–4.0)
MCH: 31.3 pg (ref 26.0–34.0)
MCHC: 33.4 g/dL (ref 30.0–36.0)
MCV: 93.7 fL (ref 80.0–100.0)
Monocytes Absolute: 0.5 10*3/uL (ref 0.1–1.0)
Monocytes Relative: 8 %
Neutro Abs: 4.7 10*3/uL (ref 1.7–7.7)
Neutrophils Relative %: 70 %
Platelets: 281 10*3/uL (ref 150–400)
RBC: 3.99 MIL/uL (ref 3.87–5.11)
RDW: 15.6 % — ABNORMAL HIGH (ref 11.5–15.5)
WBC: 6.7 10*3/uL (ref 4.0–10.5)
nRBC: 0 % (ref 0.0–0.2)

## 2020-07-06 LAB — URINALYSIS, ROUTINE W REFLEX MICROSCOPIC
Bilirubin Urine: NEGATIVE
Glucose, UA: 50 mg/dL — AB
Ketones, ur: NEGATIVE mg/dL
Leukocytes,Ua: NEGATIVE
Nitrite: NEGATIVE
Protein, ur: NEGATIVE mg/dL
Specific Gravity, Urine: 1.005 (ref 1.005–1.030)
pH: 6 (ref 5.0–8.0)

## 2020-07-06 LAB — LIPASE, BLOOD: Lipase: 39 U/L (ref 11–51)

## 2020-07-06 LAB — RESP PANEL BY RT-PCR (FLU A&B, COVID) ARPGX2
Influenza A by PCR: NEGATIVE
Influenza B by PCR: NEGATIVE
SARS Coronavirus 2 by RT PCR: NEGATIVE

## 2020-07-06 LAB — POC OCCULT BLOOD, ED: Fecal Occult Bld: NEGATIVE

## 2020-07-06 MED ORDER — MORPHINE SULFATE (PF) 4 MG/ML IV SOLN
4.0000 mg | Freq: Once | INTRAVENOUS | Status: DC
Start: 2020-07-06 — End: 2020-07-06

## 2020-07-06 MED ORDER — MORPHINE SULFATE (PF) 2 MG/ML IV SOLN
2.0000 mg | Freq: Once | INTRAVENOUS | Status: AC
  Administered 2020-07-06: 2 mg via INTRAVENOUS
  Filled 2020-07-06: qty 1

## 2020-07-06 MED ORDER — ONDANSETRON HCL 4 MG/2ML IJ SOLN
4.0000 mg | Freq: Once | INTRAMUSCULAR | Status: AC
  Administered 2020-07-06: 4 mg via INTRAVENOUS
  Filled 2020-07-06: qty 2

## 2020-07-06 NOTE — ED Notes (Signed)
Daughter updated at this time.   

## 2020-07-06 NOTE — ED Provider Notes (Signed)
[facility] EMERGENCY DEPARTMENT Provider Note   CSN: [id number] Arrival date & time: 07/06/20  1232     History Chief Complaint  Patient presents with   Abdominal Pain    [name] is a 87 y.o. female with history of atrial fibrillation (on Eliquis), CAD, AICD, CKD stage III, diabetes melitis, diverticulosis, and hypertension.  Per chart review patient was recently admitted to the hospital 6/5-6/8 for cellulitis and abscess of left lower extremity.  Just completed oral antibiotics Omnicef and doxycycline today.  Presents emerged department with a chief complaint of abdominal pain.  Patient has had abdominal pain over the last 5 days.  Pain has gradually worsened over this time.  Pain is located to upper abdomen and radiates into her chest.  Patient rates her pain 9/10 on the pain scale.  Patient is unable to describe what her pain feels like.  Pain is worse with eating.  No alleviating factors.  Patient reports that abdominal pain feels similar to pain she has experienced in the past however it is worse than it has been.  She reports that she had nausea last night but did not vomit.  Patient has had constipation and tenesmus.  Patient was able to have a bowel movement earlier today.  Patient did not notice that there was blood or melena in her stool.  Patient has had pressure and burning sensation to rectum over the last 5 to 6 days.  Patient also endorses aching and burning station to bilateral lower extremities.  This has been present over the last 4 days.  No alleviating or aggravating factors.  Patient reports that she has had swelling to her left leg since being diagnosed with cellulitis earlier this month.  Patient denies any weakness or numbness to bilateral lower extremities.  Patient endorses rhinorrhea, dysuria, shortness of breath, generalized fatigue, and chills.  Patient denies any fever, nasal congestion, cough, vomiting, abdominal distention, diarrhea, hematuria, urinary  frequency, vaginal bleeding, vaginal discharge, vaginal pain, myalgias, slurred speech, facial asymmetry, syncope.   Abdominal Pain Associated symptoms: chills, constipation, dysuria, fatigue, nausea and shortness of breath   Associated symptoms: no chest pain, no cough, no diarrhea, no fever, no hematuria, no sore throat and no vomiting       Past Medical History:  Diagnosis Date   AICD (automatic cardioverter/defibrillator) present    Allergy    SESONAL   Anxiety    ARTHRITIS    Arthritis    Atrial fibrillation (HCC)    CAD (coronary artery disease)    a. history of cardiac arrest 1993;  b. s/p LAD/LCX stenting in 2003;  c. 07/2011 Cath: LM nl, LAD patent stent, D1 80ost, LCX patent stent, RCA min irregs;  d. 04/2012 MV: EF 66%, no ishcemia;  e. 12/2013 Echo: Ef 55%, no rwma, Gr 1 DD, triv AI/MR, mildly dil LA;  f. 12/2013 Lexi MV: intermediate risk - apical ischemia and inf/infsept fixed defect, ? artifactual.   CAD in native artery 12/31/2013   Previous stents to LAD and Ramus patent on cath today 12/31/13 also There is severe disease in the ostial first diagonal which is unchanged from most recent cardiac catheterization. The right coronary artery could not be engaged selectively but nonselective angiography showed no significant disease in the proximal and midsegment.       Cataract    DENIES   Chronic back pain    Chronic sinus bradycardia    CKD (chronic kidney disease), stage III ([place])  Clotting disorder [facility])    DVT   COLITIS 12/02/2007   Qualifier: Diagnosis of  By: [name], [name]     Diabetes mellitus without complication [facility])    DENIES   Diverticulosis of colon (without mention of hemorrhage) 2007   Colonoscopy    Esophageal stricture    a. 2012 s/p dil.   Esophagitis, unspecified    a. 2012 EGD   EXTERNAL HEMORRHOIDS    GERD (gastroesophageal reflux disease)    omeprazole   H/O hiatal hernia    Hiatal hernia    a. 2012 EGD.   History of DVT in  the past, not on Coumadin now    left  leg   HYPERCHOLESTEROLEMIA    Hypertension    ICD (implantable cardiac defibrillator) in place    a. s/p initial ICD in 1993 in setting of cardiac arrest;  b. 01/2007 gen change: Guidant T135 Vitality DS VR single lead ICD.   Macular degeneration    gets injecton in eye every 5 weeks- last injection - 05/03/2013    Myocardial infarction [facility]) 1993   Near syncope 05/22/2015   Nephrolithiasis, just saw Dr [name]    Orthostatic hypotension    Peripheral vascular disease ([place])    ???   RECTAL BLEEDING 12/03/2007   Qualifier: Diagnosis of  By: [name]    Unspecified gastritis and gastroduodenitis without mention of hemorrhage    a. 2003 EGD->not noted on 2012 EGD.   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[place]) 05/12/2020   Intractable abdominal pain 03/28/2020   Lactic acidosis 03/28/2020   Elevated troponin I level 03/28/2020   Malnutrition of moderate degree 12/26/2019   Prolonged QT interval 12/23/2019   Acute lower UTI 12/23/2019   Constipation 11/27/2019   Diarrhea 11/27/2019   Lesion of pancreas 11/26/2019   Fibromuscular dysplasia of right renal artery ([place]) 11/26/2019   Left renal artery stenosis ([place]) 11/26/2019   RUQ pain    Abnormal CT of the abdomen    Vertigo 11/15/2019   Advanced care planning/counseling discussion 11/15/2019   CAD (coronary artery disease)    Hypothyroidism    Hyponatremia    Volume depletion    TSH elevation 08/01/2019   Hospital discharge follow-up 08/01/2019   Exudative age-related macular degeneration of right eye with inactive choroidal neovascularization ([place]) 04/30/2019   Retinal hemorrhage of right eye 04/30/2019   Advanced nonexudative age-related macular  degeneration of left eye with subfoveal involvement 04/30/2019   Exudative age-related macular degeneration of left eye with inactive choroidal neovascularization ([place]) 04/30/2019   Advanced nonexudative age-related macular degeneration of right eye with subfoveal involvement 04/30/2019   Posterior vitreous detachment of both eyes 04/30/2019   Vitreous degeneration, bilateral 04/30/2019   Secondary hypercoagulable state ([place]) 03/11/2019   Persistent atrial fibrillation (HCC)    Proctitis 01/09/2019   Atrial fibrillation, chronic ([place]) 01/09/2019   Hyperlipidemia 01/09/2019   Nausea 01/09/2019   Hyperglycemia 01/09/2019   COVID-19 virus infection 12/28/2018   Chronic diarrhea 09/18/2018   Abdominal pain 09/18/2018   Osteoarthritis of right knee 03/29/2018   GERD (gastroesophageal reflux disease)    Dehydration 02/06/2018   Hypokalemia 02/06/2018   Diarrhea of presumed infectious origin 02/06/2018   Acute kidney injury superimposed  on CKD ([place]) 02/05/2018   Essential hypertension 12/21/2017   Degenerative scoliosis 02/19/2017   Burst fracture of lumbar vertebra ([place]) 02/19/2017   History of drug-induced prolonged QT interval with torsade de pointes 11/01/2016   Iron deficiency anemia 10/30/2016   Long term current use of anticoagulant 02/17/2016   Near syncope 05/22/2015   Diabetes mellitus type 2, diet-controlled ([place]) 09/14/2014   ICD (implantable cardioverter-defibrillator) battery depletion 01/20/2014   Abnormal nuclear stress test, 12/30/13 12/31/2013   Coronary artery disease involving native coronary artery of native heart without angina pectoris 12/31/2013   Paroxysmal atrial fibrillation, chads2 Vasc2 score of 5, on eliquis 10/16/2013   Catecholaminergic polymorphic ventricular tachycardia ([place]) 10/16/2013   Ureteral stone with hydronephrosis [id number]   Metabolic syndrome [id number]   Orthostatic hypotension 07/27/2011   Chest pain 07/25/2011   Weakness 07/25/2011     Class: Acute   Chronic sinus bradycardia 07/25/2011   ICD (implantable cardioverter-defibrillator) in place 07/25/2011   CKD (chronic kidney disease), stage III ([place]) 07/25/2011    Class: Chronic   History of DVT in the past, on eliquis now 07/25/2011    Class: History of   Nephrolithiasis, just saw Dr [name] 07/25/2011    Class: History of   DYSPHAGIA 03/24/2010   Chronic ischemic heart disease 12/03/2007   Irritable bowel syndrome with diarrhea 12/03/2007   EPIGASTRIC PAIN 12/03/2007   Hyperlipidemia associated with type 2 diabetes mellitus ([place]) 12/02/2007    Class: History of   EXTERNAL HEMORRHOIDS 12/02/2007    Class: History of   ESOPHAGITIS 12/02/2007   GASTRITIS 12/02/2007   DIVERTICULOSIS, COLON 12/02/2007   ARTHRITIS 12/02/2007    Class: Chronic    Past Surgical History:  Procedure Laterality Date   BREAST BIOPSY Left 11/2013   CARDIAC CATHETERIZATION     CARDIAC CATHETERIZATION  01/01/15   patent stents -there is severe disease in ostial 1st diag but without change.    CARDIAC DEFIBRILLATOR PLACEMENT  02/05/2007   Guidant   CARDIOVASCULAR STRESS TEST  12/30/2013   abnormal   CARDIOVERSION N/A 02/13/2019   Procedure: CARDIOVERSION;  Surgeon: Sanda Klein, MD;  Location: [place];  Service: Cardiovascular;  Laterality: N/A;   CATARACT EXTRACTION Right    Dr. Katy Fitch   COLONOSCOPY     COLONOSCOPY WITH PROPOFOL N/A 03/30/2020   Procedure: COLONOSCOPY WITH PROPOFOL;  Surgeon: Harvel Quale, MD;  [facility];  Service: Gastroenterology;  Laterality: N/A;   coronary stents      CYSTOSCOPY WITH URETEROSCOPY Right 05/20/2013   Procedure: CYSTOSCOPY, RIGHT URETEROSCOPY STONE EXTRACTION, Insertion of right DOUBLE J STENT ;  Surgeon: Irine Seal, MD;  Location: [facility];  Service: Urology;  Laterality: Right;   CYSTOSCOPY WITH URETEROSCOPY AND STENT PLACEMENT N/A 06/03/2013   Procedure: SECOND LOOK CYSTOSCOPY WITH URETEROSCOPY  HOLMIUM LASER LITHO  AND STONE EXTRACTION Sammie Bench ;  Surgeon: Malka So, MD;  Location: [facility];  Service: Urology;  Laterality: N/A;   ESOPHAGOGASTRODUODENOSCOPY (EGD) WITH PROPOFOL N/A 11/20/2019   Procedure: ESOPHAGOGASTRODUODENOSCOPY (EGD) WITH PROPOFOL;  Surgeon: Daneil Dolin, MD;  [facility];  Service: Endoscopy;  Laterality: N/A;   GIVENS CAPSULE STUDY N/A 10/30/2016   Procedure: GIVENS CAPSULE STUDY;  Surgeon: Irene Shipper, MD;  Location: [facility] ENDOSCOPY;  Service: Endoscopy;  Laterality: N/A;  NEEDS TO BE ADMITTED FOR OBSERVATION PT. HAS DEFIB   HEMORROIDECTOMY  80's   HOLMIUM LASER APPLICATION Right [id number]   Procedure: HOLMIUM LASER APPLICATION;  Surgeon: Jenny Reichmann  Jeffie Pollock, MD;  Location: [facility];  Service: Urology;  Laterality: Right;   HYSTEROSCOPY WITH D & C  09/02/2010   Procedure: DILATATION AND CURETTAGE (D&C) /HYSTEROSCOPY;  Surgeon: Margarette Asal;  Location: [place] [facility];  Service: Gynecology;  Laterality: N/A;  Dilation and Curettage with Hysteroscopy and Polypectomy   IMPLANTABLE CARDIOVERTER DEFIBRILLATOR (ICD) GENERATOR CHANGE N/A 02/10/2014   Procedure: ICD GENERATOR CHANGE;  Surgeon: Sanda Klein, MD;  Location: [place] CATH LAB;  Service: Cardiovascular;  Laterality: N/A;   LEFT HEART CATHETERIZATION WITH CORONARY ANGIOGRAM N/A 07/28/2011   Procedure: LEFT HEART CATHETERIZATION WITH CORONARY ANGIOGRAM;  Surgeon: Lorretta Harp, MD;  Location: [facility] CATH LAB;  Service: Cardiovascular;  Laterality: N/A;   LEFT HEART CATHETERIZATION WITH CORONARY ANGIOGRAM N/A 12/31/2013   Procedure: LEFT HEART CATHETERIZATION WITH CORONARY ANGIOGRAM;  Surgeon: Wellington Hampshire, MD;  Location: [place] CATH LAB;  Service: Cardiovascular;  Laterality: N/A;     OB History   No obstetric history on file.     Family History  Problem Relation Age of Onset   Stroke Mother    Other Mother        brain tumor   Other Father        MI   Heart attack Father    Stroke Sister    Macular degeneration Sister     Diabetes Daughter    Cancer Daughter        ovarian   Colon polyps Daughter    Atrial fibrillation Sister    Hyperlipidemia Sister    Osteoporosis Sister    Stroke Sister    Uterine cancer Sister    Heart attack Brother    Heart disease Brother    Asthma Brother    Breast cancer Neg Hx     Social History   Tobacco Use   Smoking status: Never   Smokeless tobacco: Never  Vaping Use   Vaping Use: Never used  Substance Use Topics   Alcohol use: No   Drug use: No    Home Medications Prior to Admission medications   Medication Sig Start Date End Date Taking? Authorizing Provider  acetaminophen (TYLENOL) 500 MG tablet Take 1 tablet (500 mg total) by mouth every 6 (six) hours as needed for mild pain, fever or headache. 04/05/20   Wynetta Emery, Clanford L, MD  amiodarone (PACERONE) 200 MG tablet Take 200 mg by mouth daily. Patient not taking: Reported on 06/29/2020 06/16/20   [provider]  apixaban (ELIQUIS) 2.5 MG TABS tablet Take 1 tablet (2.5 mg total) by mouth 2 (two) times daily. 02/11/20   Croitoru, Mihai, MD  carboxymethylcellulose (REFRESH PLUS) 0.5 % SOLN Place 1 drop into both eyes 3 (three) times daily as needed (dry/irritated eyes.).    [provider]  Cholecalciferol (VITAMIN D) 50 MCG (2000 UT) CAPS Take 2,000 Units by mouth every morning.    [provider]  doxycycline (VIBRA-TABS) 100 MG tablet Take 1 tablet (100 mg total) by mouth 2 (two) times daily for 7 days. 06/29/20 07/06/20  [name], DO  feeding supplement (ENSURE ENLIVE / ENSURE PLUS) LIQD Take 237 mLs by mouth 2 (two) times daily between meals. 12/26/19   Mikhail, Velta Addison, DO  HYDROcodone-acetaminophen (NORCO/VICODIN) 5-325 MG tablet TAKE 1 TABLET EVERY 4 HOURS AS NEEDED FOR MODERATE PAIN 05/11/20   Cleon Gustin, MD  isosorbide mononitrate (IMDUR) 30 MG 24 hr tablet Take 1 tablet (30 mg total) by mouth daily. 06/23/20   Roxan Hockey, MD  levothyroxine (  SYNTHROID) 100 MCG  tablet Take 1 tablet (100 mcg total) by mouth daily. Patient taking differently: Take 100 mcg by mouth every morning. 01/27/20   Croitoru, Dani Gobble, MD  loratadine (CLARITIN) 10 MG tablet Take 1 tablet (10 mg total) by mouth daily as needed for allergies, rhinitis or itching. (for itching) 04/05/20   Johnson, Clanford L, MD  MELATONIN PO Take 3 mg by mouth at bedtime.    [provider]  metoprolol tartrate (LOPRESSOR) 25 MG tablet Take 0.5 tablets (12.5 mg total) by mouth 2 (two) times daily. HOLD FOR SBP<95 OR HR<65 Patient not taking: Reported on 06/29/2020 04/05/20   Murlean Iba, MD  Multiple Vitamins-Minerals (CENTRUM SILVER ULTRA WOMENS PO) Take 1 tablet by mouth every morning.    [provider]  nitroGLYCERIN (NITROSTAT) 0.4 MG SL tablet Place 1 tablet (0.4 mg total) under the tongue every 5 (five) minutes as needed for chest pain. 12/20/18   Claretta Fraise, MD  pantoprazole (PROTONIX) 40 MG tablet Take 1 tablet (40 mg total) by mouth 2 (two) times daily before a meal. Patient taking differently: Take 40 mg by mouth every morning. 04/05/20   Johnson, Clanford L, MD  PARoxetine (PAXIL) 20 MG tablet TAKE (1) TABLET DAILY IN THE MORNING. Patient taking differently: Take 20 mg by mouth every morning. 03/29/20   [name], DO  polyethylene glycol (MIRALAX / GLYCOLAX) 17 g packet Take 17 g by mouth 2 (two) times daily. 06/23/20   Roxan Hockey, MD  rosuvastatin (CRESTOR) 10 MG tablet Take 1 tablet (10 mg total) by mouth daily. Patient taking differently: Take 10 mg by mouth at bedtime. 02/24/20   [name], DO  senna-docusate (SENOKOT-S) 8.6-50 MG tablet Take 2 tablets by mouth 2 (two) times daily. Patient not taking: No sig reported 06/23/20 06/23/21  Roxan Hockey, MD    Allergies    Sotalol, Tramadol, Actos [pioglitazone], Adhesive [tape], Atorvastatin, Bactrim [sulfamethoxazole-trimethoprim], Ciprofloxacin, Contrast media [iodinated diagnostic agents],  Latex, Pedi-pre tape spray [wound dressing adhesive], and Sulfa antibiotics  Review of Systems   Review of Systems  Constitutional:  Positive for chills and fatigue. Negative for fever.  HENT:  Positive for rhinorrhea. Negative for congestion and sore throat.   Eyes:  Negative for visual disturbance.  Respiratory:  Positive for shortness of breath. Negative for cough.   Cardiovascular:  Negative for chest pain.  Gastrointestinal:  Positive for abdominal pain, constipation, nausea and rectal pain. Negative for abdominal distention, diarrhea and vomiting.  Genitourinary:  Positive for dysuria. Negative for difficulty urinating, frequency and hematuria.  Musculoskeletal:  Negative for back pain and neck pain.  Skin:  Positive for wound (left great toe). Negative for color change and rash.  Neurological:  Negative for dizziness, tremors, seizures, syncope, facial asymmetry, speech difficulty, weakness, light-headedness, numbness and headaches.  Psychiatric/Behavioral:  Negative for confusion.    Physical Exam Updated Vital Signs BP (!) 168/97 (BP Location: Left Arm)   Pulse 81   Temp 98.3 F (36.8 C) (Oral)   Resp 18   Ht '5\' 3"'$  (1.6 m)   Wt 56.6 kg   SpO2 100%   BMI 22.10 kg/m   Physical Exam Vitals and nursing note reviewed. Exam conducted with a chaperone present (female RN present).  Constitutional:      General: She is not in acute distress.    Appearance: She is not ill-appearing, toxic-appearing or diaphoretic.  HENT:     Head: Normocephalic.  Eyes:     General:  No scleral icterus.       Right eye: No discharge.        Left eye: No discharge.  Cardiovascular:     Rate and Rhythm: Normal rate.     Pulses:          Dorsalis pedis pulses are 3+ on the right side and 3+ on the left side.     Heart sounds: Normal heart sounds.  Pulmonary:     Effort: Pulmonary effort is normal. No tachypnea, bradypnea or respiratory distress.     Breath sounds: Normal breath sounds. No  stridor.  Abdominal:     General: Abdomen is flat. Bowel sounds are normal. There is no distension. There are no signs of injury.     Palpations: Abdomen is soft. There is no mass or pulsatile mass.     Tenderness: There is abdominal tenderness in the right upper quadrant and left upper quadrant. There is no guarding or rebound. Positive signs include Murphy's sign. Negative signs include McBurney's sign.     Hernia: There is no hernia in the umbilical area or ventral area.  Genitourinary:    Rectum: Guaiac result negative. Tenderness and external hemorrhoid present. No mass, anal fissure or internal hemorrhoid. Normal anal tone.     Comments: Patient has external hemorrhoid to 12 o'clock position, skin tags at 9 and 6 o'clock position.  Patient has tenderness to rectum on exam.  No stool noted in rectal vault.  No blood noted on gloved hand after rectal exam. Musculoskeletal:     Cervical back: Neck supple.     Right lower leg: Normal.     Left lower leg: Normal.     Right ankle: No swelling, deformity, ecchymosis or lacerations. No tenderness. Normal range of motion.     Left ankle: No swelling, deformity, ecchymosis or lacerations. No tenderness. Normal range of motion.     Right foot: Normal range of motion. No swelling, deformity, laceration, tenderness or bony tenderness. Normal pulse.     Left foot: Normal range of motion. No swelling, deformity, laceration, tenderness or bony tenderness. Normal pulse.     Comments: +5 strength to dorsiflexion and plantar flexion bilaterally, patient able to lift and hold both lower extremities against gravity without difficulty.  No swelling or edema noted to bilateral lower extremities.  Patient endorses decreased sensation to bilateral feet.  Patient able to move bilateral upper extremities without difficulty CVA at this time.  Skin:    General: Skin is warm and dry.     Coloration: Skin is not cyanotic or jaundiced.  Neurological:     General: No  focal deficit present.     Mental Status: She is alert and oriented to person, place, and time.     GCS: GCS eye subscore is 4. GCS verbal subscore is 5. GCS motor subscore is 6.  Psychiatric:        Behavior: Behavior is cooperative.    ED Results / Procedures / Treatments   Labs (all labs ordered are listed, but only abnormal results are displayed) Labs Reviewed  CBC WITH DIFFERENTIAL/PLATELET - Abnormal; Notable for the following components:      Result Value   RDW 15.6 (*)    All other components within normal limits  COMPREHENSIVE METABOLIC PANEL - Abnormal; Notable for the following components:   Sodium 133 (*)    Glucose, Bld 139 (*)    Creatinine, Ser 1.14 (*)    Albumin 3.3 (*)    GFR,  Estimated 47 (*)    All other components within normal limits  URINALYSIS, ROUTINE W REFLEX MICROSCOPIC - Abnormal; Notable for the following components:   Color, Urine STRAW (*)    Glucose, UA 50 (*)    Hgb urine dipstick LARGE (*)    Bacteria, UA RARE (*)    All other components within normal limits  RESP PANEL BY RT-PCR (FLU A&B, COVID) [id number]  URINE CULTURE  LIPASE, BLOOD  POC OCCULT BLOOD, ED    EKG EKG Interpretation  Date/Time:  Tuesday July 06 2020 12:49:33 EDT Ventricular Rate:  81 PR Interval:  258 QRS Duration: 140 QT Interval:  402 QTC Calculation: 467 R Axis:   -54 Text Interpretation: Sinus or ectopic atrial rhythm Prolonged PR interval Left bundle branch block No significant change since last tracing Confirmed by Isla Pence [phone]) on 07/06/2020 1:00:41 PM  Radiology CT ABDOMEN PELVIS WO CONTRAST  Result Date: 07/06/2020 CLINICAL DATA:  Epigastric and LEFT upper quadrant abdominal pain with nausea, fever, and chills for 3 days EXAM: CT ABDOMEN AND PELVIS WITHOUT CONTRAST TECHNIQUE: Multidetector CT imaging of the abdomen and pelvis was performed following the standard protocol without IV contrast. Sagittal and coronal MPR images reconstructed from axial data  set. No oral contrast administered. COMPARISON:  06/03/2020 FINDINGS: Lower chest: 5 mm nodular density RIGHT lower lobe image 25 unchanged since earlier exam of 11/15/2019. Pacemaker lead RIGHT ventricle. Hepatobiliary: Dependent density in gallbladder question tiny calculi. Gallbladder and liver otherwise normal appearance. Pancreas: Normal appearance Spleen: Normal appearance Adrenals/Urinary Tract: Atrophic kidneys with parenchymal calcifications and questionable tiny calculi bilaterally. No renal mass or hydronephrosis. Ureters and bladder unremarkable. Adrenal glands normal appearance. Stomach/Bowel: Normal appendix. Stomach and bowel loops normal appearance Vascular/Lymphatic: Atherosclerotic calcifications aorta without aneurysm. No adenopathy. Reproductive: Atrophic uterus.  Unremarkable adnexa. Other: No free air or free fluid. Subcutaneous wires in the anterior chest wall. Musculoskeletal: Osseous demineralization. Chronic superior endplate compression deformity of T12 and L1. Scattered degenerative disc and facet disease changes thoracolumbar spine with levoconvex scoliosis. IMPRESSION: Atrophic kidneys with parenchymal calcifications and questionable tiny calculi bilaterally. No acute intra-abdominal or intrapelvic abnormalities. Probable cholelithiasis. 5 mm nodular density unchanged since at least 11/15/2019. Aortic Atherosclerosis (ICD10-I70.0). Electronically Signed   By: Lavonia Dana M.D.   On: 07/06/2020 15:07   US Abdomen Limited  Result Date: 07/06/2020 CLINICAL DATA:  Upper abdominal pain for 3 days EXAM: ULTRASOUND ABDOMEN LIMITED RIGHT UPPER QUADRANT COMPARISON:  Abdominal ultrasound 05/28/2020 FINDINGS: Gallbladder: No gallstones or wall thickening visualized. There is a 3 mm echogenic focus along the gallbladder wall which may represent a cholesterol deposit. No sonographic Murphy sign noted by sonographer. Common bile duct: Diameter: 0.5 cm, within normal limits Liver: No focal lesion  identified. Within normal limits in parenchymal echogenicity. Portal vein is patent on color Doppler imaging with normal direction of blood flow towards the liver. Other: None. IMPRESSION: No sonographic finding to explain the patient's abdominal pain. Electronically Signed   By: Audie Pinto M.D.   On: 07/06/2020 16:10    Procedures Procedures   Medications Ordered in ED Medications  ondansetron (ZOFRAN) injection 4 mg (4 mg Intravenous Given 07/06/20 1348)  morphine 2 MG/ML injection 2 mg (2 mg Intravenous Given 07/06/20 1349)  morphine 2 MG/ML injection 2 mg (2 mg Intravenous Given 07/06/20 1607)    ED Course  I have reviewed the triage vital signs and the nursing notes.  Pertinent labs & imaging results that were available during my care of  the patient were reviewed by me and considered in my medical decision making (see chart for details).    MDM Rules/Calculators/A&P                          Alert 87 year old female no acute stress, nontoxic-appearing.  Patient presents emergency department with a chief complaint of abdominal pain.  Patient reports that she has had similar abdominal pain in the past however pain today is worse than it has been.  Patient has had worsening abdominal pain over the last 5 days.  Pain is located to upper abdomen.  Patient also endorses pressure and burning sensation to rectum x6 days.  Physical exam abdomen soft, nondistended, tenderness to right upper and left upper quadrant, positive Murphy sign.  No distention, guarding, or rebound tenderness noted.  Will obtain CMP, lipase, CBC, urinalysis respiratory panel, EKG and chest x-ray.  Patient also complains of itching and burning station to bilateral lower legs x4 days.  Patient denies any slurred speech, facial asymmetry, or focal weakness.  +5 strength to dorsiflexion and plantar flexion bilaterally, patient able to lift and hold both lower extremities against gravity without difficulty.  No swelling  or edema noted to bilateral lower extremities.  Patient endorses decreased sensation to bilateral feet.  Able to move upper extremities without difficulty.  Low suspicion for CVA at this time.   Patient given morphine and Zofran for pain management and nausea.  Respiratory panel negative for COVID-19 or influenza. Lipase within normal limits. CBC unremarkable. CMP shows sodium slightly decreased at 133.  Creatinine elevated at 1.14, appears to be baseline for patient. Will obtain noncontrast CT abdomen/pelvis.    CT abdomen pelvis shows atrophic kidneys with parenchymal calcifications and questionable tiny calculi bilaterally, no acute intra-abdominal or intrapelvic abnormalities.  Probable cholelithiasis.  5 mm nodular density unchanged since at least 11/15/2019.  Patient continues to complain of pain to upper abdomen.  Patient rates pain 7/10 the pain scale at present.  We will give patient 2 mg morphine and order right upper quadrant ultrasound to evaluate for cholelithiasis.  Ultrasound shows no gallstones or wall thickening, 3 mm echogenic focus along the gallbladder wall which may represent cholesterol deposit.  No sonographic Murphy sign noted by sonographer.  Patient reports complete resolution of her abdominal pain.  On serial reexamination patient's abdomen remains soft, nondistended.  Patient able to tolerate p.o. fluids.  Patient is hemodynamically stable.  Will discharge patient to follow-up with gastroenterology and her primary care provider.  Patient given strict return precautions.  Patient expressed understanding will instructions and is agreeable with this plan.  Patient was discussed with and evaluated by Dr. Gilford Raid.    Final Clinical Impression(s) / ED Diagnoses Final diagnoses:  Pain of upper abdomen    Rx / DC Orders ED Discharge Orders     None        Dyann Ruddle 07/06/20 2345    Isla Pence, MD 07/10/20 [phone]

## 2020-07-06 NOTE — ED Notes (Signed)
Patient transported to CT 

## 2020-07-06 NOTE — ED Triage Notes (Addendum)
Pt brought in by [facility] with c/o upper abdominal pain, nausea, low grade fever, chills, burning sensation in rectum x 3 days. Denies vomiting, diarrhea. BP 195/99 for EMS. Hx diverticulitis.

## 2020-07-06 NOTE — Discharge Instructions (Addendum)
You came to the emergency department today to be evaluated for your abdominal pain.  Your lab work, urinalysis, CT scan, and ultrasound were reassuring that there is no acute problem in your abdomen or pelvis causing your symptoms.  The pain you are experiencing may be due to the chronic abdominal pain that you have had in the past.  Please follow-up with your gastroenterologist for further work-up of your abdominal pain.  I would also like you to follow-up with your primary care provider in the next few days to be reassessed.  Get help right away if: Your pain does not go away as soon as your health care provider told you to expect. You cannot stop vomiting. Your pain is only in areas of the abdomen, such as the right side or the left lower portion of the abdomen. Pain on the right side could be caused by appendicitis. You have bloody or black stools, or stools that look like tar. You have severe pain, cramping, or bloating in your abdomen. You have signs of dehydration, such as: Dark urine, very little urine, or no urine. Cracked lips. Dry mouth. Sunken eyes. Sleepiness. Weakness. You have trouble breathing or chest pain.

## 2020-07-07 ENCOUNTER — Telehealth: Payer: Self-pay | Admitting: Family Medicine

## 2020-07-07 NOTE — Telephone Encounter (Signed)
REFERRAL REQUEST Telephone Note  Have you been seen at our office for this problem? Yes  (Advise that they may need an appointment with their PCP before a referral can be done)  Reason for Referral: Heart Doctor? Referral discussed with patient: yes Best contact number of patient for referral team:    Has patient been seen by a specialist for this issue before:  Patient provider preference for referral:  Patient location preference for referral:    Patient notified that referrals can take up to a week or longer to process. If they haven't heard anything within a week they should call back and speak with the referral department.    Gottschalk's pt.  She went to [facility] yesterday & she seen Lajuana Ripple about a week ago. No one has called her back.

## 2020-07-08 ENCOUNTER — Telehealth: Payer: Self-pay

## 2020-07-08 LAB — URINE CULTURE: Culture: <10000 — AB

## 2020-07-08 NOTE — Telephone Encounter (Signed)
Anderson Malta with [facility] left message with Tye Maryland today asking for a call back. Pt is needing supplies?   Call back [phone]

## 2020-07-09 ENCOUNTER — Telehealth: Payer: Self-pay | Admitting: *Deleted

## 2020-07-09 ENCOUNTER — Other Ambulatory Visit: Payer: Self-pay | Admitting: Family Medicine

## 2020-07-09 DIAGNOSIS — L97528 Non-pressure chronic ulcer of other part of left foot with other specified severity: Secondary | ICD-10-CM | POA: Diagnosis not present

## 2020-07-09 DIAGNOSIS — E11621 Type 2 diabetes mellitus with foot ulcer: Secondary | ICD-10-CM | POA: Diagnosis not present

## 2020-07-09 DIAGNOSIS — Z95810 Presence of automatic (implantable) cardiac defibrillator: Secondary | ICD-10-CM | POA: Diagnosis not present

## 2020-07-09 DIAGNOSIS — I25119 Atherosclerotic heart disease of native coronary artery with unspecified angina pectoris: Secondary | ICD-10-CM | POA: Diagnosis not present

## 2020-07-09 DIAGNOSIS — I4891 Unspecified atrial fibrillation: Secondary | ICD-10-CM | POA: Diagnosis not present

## 2020-07-09 DIAGNOSIS — L03116 Cellulitis of left lower limb: Secondary | ICD-10-CM | POA: Diagnosis not present

## 2020-07-09 NOTE — Telephone Encounter (Signed)
FYI: Tc from [place] w/ [facility] Pt had increase in pain level at visit 8-9/10 abd pain, had BM yesteray. Had not taken pain med at time of visit. Instructed on taking her pain medication.

## 2020-07-09 NOTE — Telephone Encounter (Signed)
[name] looking for order that she faxed in May, nothing scanned in under media, she will refax it to my direct fax [phone]

## 2020-07-09 NOTE — Telephone Encounter (Signed)
She sees Dr Helene Shoe already.  I'm not sure what referral she is talking about

## 2020-07-12 ENCOUNTER — Telehealth: Payer: Self-pay | Admitting: Family Medicine

## 2020-07-12 NOTE — Telephone Encounter (Signed)
Nikki called from [facility] to give call report.  States that she spoke with patient and patients family member and says pt is complaining about her toe turning blue and leg is swollen and painful all the way up to pts private area.   Says pt reports low grade temp yesterday but no temp today. Pt reports not feeling good.  Pt was advised to go to the ER and pt refused. Said she has been to the ER before and never gets any help.  Lexine Baton said she could get a nurse to come to pts home tomorrow but its too late in the day to get a nurse to look at pt today.  Wants to know if we can call patient to motivate her to be seen somewhere today.  Can call Lexine Baton at [phone] for questions.

## 2020-07-12 NOTE — Telephone Encounter (Signed)
Spoke with Ms. Weingarten, she refuses to go to the ER and said it is a waste of her time and money, she will wait for the nurse to look at her toe tomorrow.  I called Lexine Baton and let her know I had spoken with Ms. Kohman and she had refused to go to ER.

## 2020-07-13 ENCOUNTER — Other Ambulatory Visit: Payer: Self-pay

## 2020-07-13 ENCOUNTER — Emergency Department (HOSPITAL_COMMUNITY): Payer: Medicare Other

## 2020-07-13 ENCOUNTER — Encounter (HOSPITAL_COMMUNITY): Payer: Self-pay | Admitting: *Deleted

## 2020-07-13 ENCOUNTER — Emergency Department (HOSPITAL_COMMUNITY)
Admission: EM | Admit: 2020-07-13 | Discharge: 2020-07-13 | Disposition: A | Discharge status: Home / Self Care | Payer: Medicare Other | Attending: Emergency Medicine | Admitting: Emergency Medicine

## 2020-07-13 DIAGNOSIS — R110 Nausea: Secondary | ICD-10-CM | POA: Insufficient documentation

## 2020-07-13 DIAGNOSIS — I129 Hypertensive chronic kidney disease with stage 1 through stage 4 chronic kidney disease, or unspecified chronic kidney disease: Secondary | ICD-10-CM | POA: Diagnosis not present

## 2020-07-13 DIAGNOSIS — E1122 Type 2 diabetes mellitus with diabetic chronic kidney disease: Secondary | ICD-10-CM | POA: Insufficient documentation

## 2020-07-13 DIAGNOSIS — I701 Atherosclerosis of renal artery: Secondary | ICD-10-CM | POA: Diagnosis not present

## 2020-07-13 DIAGNOSIS — R1032 Left lower quadrant pain: Secondary | ICD-10-CM | POA: Diagnosis not present

## 2020-07-13 DIAGNOSIS — E039 Hypothyroidism, unspecified: Secondary | ICD-10-CM | POA: Diagnosis not present

## 2020-07-13 DIAGNOSIS — N183 Chronic kidney disease, stage 3 unspecified: Secondary | ICD-10-CM | POA: Insufficient documentation

## 2020-07-13 DIAGNOSIS — R200 Anesthesia of skin: Secondary | ICD-10-CM | POA: Diagnosis not present

## 2020-07-13 DIAGNOSIS — I2510 Atherosclerotic heart disease of native coronary artery without angina pectoris: Secondary | ICD-10-CM | POA: Insufficient documentation

## 2020-07-13 DIAGNOSIS — Z79899 Other long term (current) drug therapy: Secondary | ICD-10-CM | POA: Diagnosis not present

## 2020-07-13 DIAGNOSIS — R079 Chest pain, unspecified: Secondary | ICD-10-CM | POA: Diagnosis not present

## 2020-07-13 DIAGNOSIS — R109 Unspecified abdominal pain: Secondary | ICD-10-CM | POA: Insufficient documentation

## 2020-07-13 DIAGNOSIS — Z91040 Latex allergy status: Secondary | ICD-10-CM | POA: Diagnosis not present

## 2020-07-13 DIAGNOSIS — Z8616 Personal history of COVID-19: Secondary | ICD-10-CM | POA: Insufficient documentation

## 2020-07-13 DIAGNOSIS — K6289 Other specified diseases of anus and rectum: Secondary | ICD-10-CM | POA: Diagnosis not present

## 2020-07-13 DIAGNOSIS — Z95810 Presence of automatic (implantable) cardiac defibrillator: Secondary | ICD-10-CM | POA: Diagnosis not present

## 2020-07-13 DIAGNOSIS — E11621 Type 2 diabetes mellitus with foot ulcer: Secondary | ICD-10-CM | POA: Diagnosis not present

## 2020-07-13 DIAGNOSIS — I700 Atherosclerosis of aorta: Secondary | ICD-10-CM | POA: Diagnosis not present

## 2020-07-13 DIAGNOSIS — N200 Calculus of kidney: Secondary | ICD-10-CM | POA: Diagnosis not present

## 2020-07-13 DIAGNOSIS — L03116 Cellulitis of left lower limb: Secondary | ICD-10-CM | POA: Diagnosis not present

## 2020-07-13 DIAGNOSIS — Z7901 Long term (current) use of anticoagulants: Secondary | ICD-10-CM | POA: Diagnosis not present

## 2020-07-13 DIAGNOSIS — N261 Atrophy of kidney (terminal): Secondary | ICD-10-CM | POA: Diagnosis not present

## 2020-07-13 DIAGNOSIS — R202 Paresthesia of skin: Secondary | ICD-10-CM | POA: Insufficient documentation

## 2020-07-13 DIAGNOSIS — Z86711 Personal history of pulmonary embolism: Secondary | ICD-10-CM | POA: Diagnosis not present

## 2020-07-13 DIAGNOSIS — L97528 Non-pressure chronic ulcer of other part of left foot with other specified severity: Secondary | ICD-10-CM | POA: Diagnosis not present

## 2020-07-13 DIAGNOSIS — K869 Disease of pancreas, unspecified: Secondary | ICD-10-CM | POA: Insufficient documentation

## 2020-07-13 DIAGNOSIS — R911 Solitary pulmonary nodule: Secondary | ICD-10-CM | POA: Diagnosis not present

## 2020-07-13 DIAGNOSIS — I25119 Atherosclerotic heart disease of native coronary artery with unspecified angina pectoris: Secondary | ICD-10-CM | POA: Diagnosis not present

## 2020-07-13 DIAGNOSIS — M79609 Pain in unspecified limb: Secondary | ICD-10-CM

## 2020-07-13 DIAGNOSIS — I4891 Unspecified atrial fibrillation: Secondary | ICD-10-CM | POA: Diagnosis not present

## 2020-07-13 DIAGNOSIS — G8929 Other chronic pain: Secondary | ICD-10-CM | POA: Insufficient documentation

## 2020-07-13 DIAGNOSIS — M419 Scoliosis, unspecified: Secondary | ICD-10-CM | POA: Diagnosis not present

## 2020-07-13 HISTORY — DX: Infrarenal abdominal aortic aneurysm, without rupture: I71.43

## 2020-07-13 LAB — URINALYSIS, ROUTINE W REFLEX MICROSCOPIC
Bilirubin Urine: NEGATIVE
Glucose, UA: NEGATIVE mg/dL
Hgb urine dipstick: NEGATIVE
Ketones, ur: NEGATIVE mg/dL
Leukocytes,Ua: NEGATIVE
Nitrite: NEGATIVE
Protein, ur: NEGATIVE mg/dL
Specific Gravity, Urine: 1.009 (ref 1.005–1.030)
pH: 8 (ref 5.0–8.0)

## 2020-07-13 LAB — CBC WITH DIFFERENTIAL/PLATELET
Abs Immature Granulocytes: 0.03 10*3/uL (ref 0.00–0.07)
Basophils Absolute: 0.1 10*3/uL (ref 0.0–0.1)
Basophils Relative: 1 %
Eosinophils Absolute: 0.2 10*3/uL (ref 0.0–0.5)
Eosinophils Relative: 5 %
HCT: 34.5 % — ABNORMAL LOW (ref 36.0–46.0)
Hemoglobin: 11.2 g/dL — ABNORMAL LOW (ref 12.0–15.0)
Immature Granulocytes: 1 %
Lymphocytes Relative: 21 %
Lymphs Abs: 1 10*3/uL (ref 0.7–4.0)
MCH: 31 pg (ref 26.0–34.0)
MCHC: 32.5 g/dL (ref 30.0–36.0)
MCV: 95.6 fL (ref 80.0–100.0)
Monocytes Absolute: 0.7 10*3/uL (ref 0.1–1.0)
Monocytes Relative: 14 %
Neutro Abs: 2.9 10*3/uL (ref 1.7–7.7)
Neutrophils Relative %: 58 %
Platelets: 170 10*3/uL (ref 150–400)
RBC: 3.61 MIL/uL — ABNORMAL LOW (ref 3.87–5.11)
RDW: 15.4 % (ref 11.5–15.5)
WBC: 4.9 10*3/uL (ref 4.0–10.5)
nRBC: 0 % (ref 0.0–0.2)

## 2020-07-13 LAB — COMPREHENSIVE METABOLIC PANEL
ALT: 14 U/L (ref 0–44)
AST: 22 U/L (ref 15–41)
Albumin: 3.1 g/dL — ABNORMAL LOW (ref 3.5–5.0)
Alkaline Phosphatase: 65 U/L (ref 38–126)
Anion gap: 7 (ref 5–15)
BUN: 22 mg/dL (ref 8–23)
CO2: 27 mmol/L (ref 22–32)
Calcium: 9 mg/dL (ref 8.9–10.3)
Chloride: 100 mmol/L (ref 98–111)
Creatinine, Ser: 1.25 mg/dL — ABNORMAL HIGH (ref 0.44–1.00)
GFR, Estimated: 42 mL/min — ABNORMAL LOW (ref >60)
Glucose, Bld: 156 mg/dL — ABNORMAL HIGH (ref 70–99)
Potassium: 4.5 mmol/L (ref 3.5–5.1)
Sodium: 134 mmol/L — ABNORMAL LOW (ref 135–145)
Total Bilirubin: 0.8 mg/dL (ref 0.3–1.2)
Total Protein: 5.9 g/dL — ABNORMAL LOW (ref 6.5–8.1)

## 2020-07-13 LAB — LACTIC ACID, PLASMA
Lactic Acid, Venous: 1.3 mmol/L (ref 0.5–1.9)
Lactic Acid, Venous: 2 mmol/L (ref 0.5–1.9)

## 2020-07-13 LAB — TROPONIN I (HIGH SENSITIVITY)
Troponin I (High Sensitivity): 8 ng/L (ref <18)
Troponin I (High Sensitivity): 9 ng/L (ref <18)

## 2020-07-13 LAB — LIPASE, BLOOD: Lipase: 34 U/L (ref 11–51)

## 2020-07-13 MED ORDER — SODIUM CHLORIDE 0.9 % IV BOLUS
500.0000 mL | Freq: Once | INTRAVENOUS | Status: AC
  Administered 2020-07-13: 500 mL via INTRAVENOUS

## 2020-07-13 MED ORDER — IOHEXOL 350 MG/ML SOLN
75.0000 mL | Freq: Once | INTRAVENOUS | Status: AC | PRN
  Administered 2020-07-13: 75 mL via INTRAVENOUS

## 2020-07-13 NOTE — ED Triage Notes (Signed)
Left leg pain, home health nurse asked her to come in for evaluation for possible blood clot, patient also c/o infection in leg and abdomen

## 2020-07-13 NOTE — ED Notes (Signed)
Patient given warm blanket and environment secured at this time.

## 2020-07-13 NOTE — Discharge Instructions (Addendum)
2. A 7 mm hypodense lesion in the neck of the pancreas,  indeterminate. MRI may provide better characterization on a  nonemergent/outpatient basis, if clinically indicated.

## 2020-07-13 NOTE — ED Provider Notes (Signed)
[facility] EMERGENCY DEPARTMENT Provider Note   CSN: [id number] Arrival date & time: 07/13/20  1316     History Chief Complaint  Patient presents with   Leg Pain    [name] is a 87 y.o. female with a complex past medical history documented below, including A. fib anticoagulated with Eliquis, CAD, DM, DVT, ICD in place, known focal dissection of the infrarenal abdominal aorta.  Additionally she has had a complicated recent past medical course including cellulitis of the left leg with swelling requiring admission.  She has chronic abdominal pain with postprandial abdominal pain and nausea work-up including EGD, CTA, US abdomen, HIDA scan, colonoscopy.  It appears that GI suspects microvascular angina versus bowel hypersensitivity.  She presents today for evaluation of multiple complaints.  Her primary concern is worsening pain and sensation changes in her left leg.  She states that since Sunday she has had what started as paresthesias into burning and then numbness of her entire left leg.  She reports that she feels pressure in her rectum and vagina.  She has not had a bowel movement since Saturday per her report.  She was seen on 07/06/2020 for abdominal pain where she had reported "  Patient also complains of itching and burning station to bilateral lower legs x4 days.  Patient denies any slurred speech, facial asymmetry, or focal weakness.  +5 strength to dorsiflexion and plantar flexion bilaterally" per [employer] PA-C.   Patient also reports her chronic abdominal pain is worse.  She reports pressure in her rectum and vagina.  These were also reported on 6/21 during her ED visit, at that point she reportedly had no stool or blood noted. Patient states that this is unchanged.    HPI     Past Medical History:  Diagnosis Date   AICD (automatic cardioverter/defibrillator) present    Allergy    SESONAL   Aneurysm of infrarenal abdominal aorta (HCC)    Anxiety     ARTHRITIS    Arthritis    Atrial fibrillation (HCC)    CAD (coronary artery disease)    a. history of cardiac arrest 1993;  b. s/p LAD/LCX stenting in 2003;  c. 07/2011 Cath: LM nl, LAD patent stent, D1 80ost, LCX patent stent, RCA min irregs;  d. 04/2012 MV: EF 66%, no ishcemia;  e. 12/2013 Echo: Ef 55%, no rwma, Gr 1 DD, triv AI/MR, mildly dil LA;  f. 12/2013 Lexi MV: intermediate risk - apical ischemia and inf/infsept fixed defect, ? artifactual.   CAD in native artery 12/31/2013   Previous stents to LAD and Ramus patent on cath today 12/31/13 also There is severe disease in the ostial first diagonal which is unchanged from most recent cardiac catheterization. The right coronary artery could not be engaged selectively but nonselective angiography showed no significant disease in the proximal and midsegment.       Cataract    DENIES   Chronic back pain    Chronic sinus bradycardia    CKD (chronic kidney disease), stage III (HCC)    Clotting disorder ([place])    DVT   COLITIS 12/02/2007   Qualifier: Diagnosis of  By: [name], [name]     Diabetes mellitus without complication ([place])    DENIES   Diverticulosis of colon (without mention of hemorrhage) 2007   Colonoscopy    Esophageal stricture    a. 2012 s/p dil.   Esophagitis, unspecified    a. 2012 EGD   EXTERNAL HEMORRHOIDS  GERD (gastroesophageal reflux disease)    omeprazole   H/O hiatal hernia    Hiatal hernia    a. 2012 EGD.   History of DVT in the past, not on Coumadin now    left  leg   HYPERCHOLESTEROLEMIA    Hypertension    ICD (implantable cardiac defibrillator) in place    a. s/p initial ICD in 1993 in setting of cardiac arrest;  b. 01/2007 gen change: Guidant T135 Vitality DS VR single lead ICD.   Macular degeneration    gets injecton in eye every 5 weeks- last injection - 05/03/2013    Myocardial infarction [facility]) 1993   Near syncope 05/22/2015   Nephrolithiasis, just saw Dr [name]    Orthostatic  hypotension    Peripheral vascular disease ([place])    ???   RECTAL BLEEDING 12/03/2007   Qualifier: Diagnosis of  By: [name]    Unspecified gastritis and gastroduodenitis without mention of hemorrhage    a. 2003 EGD->not noted on 2012 EGD.   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[place]) 05/12/2020   Intractable abdominal pain 03/28/2020   Lactic acidosis 03/28/2020   Elevated troponin I level 03/28/2020   Malnutrition of moderate degree 12/26/2019   Prolonged QT interval 12/23/2019   Acute lower UTI 12/23/2019   Constipation 11/27/2019   Diarrhea 11/27/2019   Lesion of pancreas 11/26/2019   Fibromuscular dysplasia of right renal artery ([place]) 11/26/2019   Left renal artery stenosis (HCC) 11/26/2019   RUQ pain    Abnormal CT of the abdomen    Vertigo 11/15/2019   Advanced care planning/counseling discussion 11/15/2019   CAD (coronary artery disease)    Hypothyroidism    Hyponatremia    Volume depletion    TSH elevation 08/01/2019   Hospital discharge follow-up 08/01/2019   Exudative age-related macular degeneration of right eye with inactive choroidal neovascularization ([place]) 04/30/2019   Retinal hemorrhage of right eye 04/30/2019   Advanced nonexudative age-related macular degeneration of left eye with subfoveal involvement 04/30/2019   Exudative age-related macular degeneration of left eye with inactive choroidal neovascularization ([place]) 04/30/2019   Advanced nonexudative age-related macular degeneration of right eye with subfoveal involvement 04/30/2019   Posterior vitreous detachment of both eyes 04/30/2019   Vitreous degeneration, bilateral 04/30/2019   Secondary hypercoagulable state ([place]) 03/11/2019   Persistent atrial fibrillation (HCC)     Proctitis 01/09/2019   Atrial fibrillation, chronic ([place]) 01/09/2019   Hyperlipidemia 01/09/2019   Nausea 01/09/2019   Hyperglycemia 01/09/2019   COVID-19 virus infection 12/28/2018   Chronic diarrhea 09/18/2018   Abdominal pain 09/18/2018   Osteoarthritis of right knee 03/29/2018   GERD (gastroesophageal reflux disease)    Dehydration 02/06/2018   Hypokalemia 02/06/2018   Diarrhea of presumed infectious origin 02/06/2018   Acute kidney injury superimposed on CKD ([place]) 02/05/2018   Essential hypertension 12/21/2017   Degenerative scoliosis 02/19/2017   Burst fracture of lumbar vertebra ([place]) 02/19/2017   History of drug-induced prolonged QT interval with torsade de pointes 11/01/2016   Iron deficiency anemia 10/30/2016   Long term current use of anticoagulant 02/17/2016   Near syncope 05/22/2015   Diabetes mellitus type 2, diet-controlled ([place]) 09/14/2014   ICD (implantable cardioverter-defibrillator) battery depletion 01/20/2014   Abnormal nuclear  stress test, 12/30/13 12/31/2013   Coronary artery disease involving native coronary artery of native heart without angina pectoris 12/31/2013   Paroxysmal atrial fibrillation, chads2 Vasc2 score of 5, on eliquis 10/16/2013   Catecholaminergic polymorphic ventricular tachycardia ([place]) 10/16/2013   Ureteral stone with hydronephrosis [id number]   Metabolic syndrome [id number]   Orthostatic hypotension 07/27/2011   Chest pain 07/25/2011   Weakness 07/25/2011    Class: Acute   Chronic sinus bradycardia 07/25/2011   ICD (implantable cardioverter-defibrillator) in place 07/25/2011   CKD (chronic kidney disease), stage III ([place]) 07/25/2011    Class: Chronic   History of DVT in the past, on eliquis now 07/25/2011    Class: History of   Nephrolithiasis, just saw Dr [name] 07/25/2011    Class: History of   DYSPHAGIA 03/24/2010   Chronic ischemic heart disease 12/03/2007   Irritable bowel syndrome with diarrhea 12/03/2007    EPIGASTRIC PAIN 12/03/2007   Hyperlipidemia associated with type 2 diabetes mellitus ([place]) 12/02/2007    Class: History of   EXTERNAL HEMORRHOIDS 12/02/2007    Class: History of   ESOPHAGITIS 12/02/2007   GASTRITIS 12/02/2007   DIVERTICULOSIS, COLON 12/02/2007   ARTHRITIS 12/02/2007    Class: Chronic    Past Surgical History:  Procedure Laterality Date   BREAST BIOPSY Left 11/2013   CARDIAC CATHETERIZATION     CARDIAC CATHETERIZATION  01/01/15   patent stents -there is severe disease in ostial 1st diag but without change.    CARDIAC DEFIBRILLATOR PLACEMENT  02/05/2007   Guidant   CARDIOVASCULAR STRESS TEST  12/30/2013   abnormal   CARDIOVERSION N/A 02/13/2019   Procedure: CARDIOVERSION;  Surgeon: Sanda Klein, MD;  Location: [place];  Service: Cardiovascular;  Laterality: N/A;   CATARACT EXTRACTION Right    Dr. Katy Fitch   COLONOSCOPY     COLONOSCOPY WITH PROPOFOL N/A 03/30/2020   Procedure: COLONOSCOPY WITH PROPOFOL;  Surgeon: Harvel Quale, MD;  [facility];  Service: Gastroenterology;  Laterality: N/A;   coronary stents      CYSTOSCOPY WITH URETEROSCOPY Right 05/20/2013   Procedure: CYSTOSCOPY, RIGHT URETEROSCOPY STONE EXTRACTION, Insertion of right DOUBLE J STENT ;  Surgeon: Irine Seal, MD;  Location: [facility] ORS;  Service: Urology;  Laterality: Right;   CYSTOSCOPY WITH URETEROSCOPY AND STENT PLACEMENT N/A 06/03/2013   Procedure: SECOND LOOK CYSTOSCOPY WITH URETEROSCOPY  HOLMIUM LASER LITHO AND STONE EXTRACTION Sammie Bench ;  Surgeon: Malka So, MD;  Location: [facility];  Service: Urology;  Laterality: N/A;   ESOPHAGOGASTRODUODENOSCOPY (EGD) WITH PROPOFOL N/A 11/20/2019   Procedure: ESOPHAGOGASTRODUODENOSCOPY (EGD) WITH PROPOFOL;  Surgeon: Daneil Dolin, MD;  [facility];  Service: Endoscopy;  Laterality: N/A;   GIVENS CAPSULE STUDY N/A 10/30/2016   Procedure: GIVENS CAPSULE STUDY;  Surgeon: Irene Shipper, MD;  [facility] ENDOSCOPY;  Service:  Endoscopy;  Laterality: N/A;  NEEDS TO BE ADMITTED FOR OBSERVATION PT. HAS DEFIB   HEMORROIDECTOMY  80's   HOLMIUM LASER APPLICATION Right [id number]   Procedure: HOLMIUM LASER APPLICATION;  Surgeon: Irine Seal, MD;  Location: [facility];  Service: Urology;  Laterality: Right;   HYSTEROSCOPY WITH D & C  09/02/2010   Procedure: DILATATION AND CURETTAGE (D&C) /HYSTEROSCOPY;  Surgeon: Margarette Asal;  Location: [place] [facility];  Service: Gynecology;  Laterality: N/A;  Dilation and Curettage with Hysteroscopy and Polypectomy   IMPLANTABLE CARDIOVERTER DEFIBRILLATOR (ICD) GENERATOR CHANGE N/A 02/10/2014   Procedure: ICD GENERATOR CHANGE;  Surgeon: Sanda Klein, MD;  Location: [facility] CATH  LAB;  Service: Cardiovascular;  Laterality: N/A;   LEFT HEART CATHETERIZATION WITH CORONARY ANGIOGRAM N/A 07/28/2011   Procedure: LEFT HEART CATHETERIZATION WITH CORONARY ANGIOGRAM;  Surgeon: Lorretta Harp, MD;  Location: [facility] CATH LAB;  Service: Cardiovascular;  Laterality: N/A;   LEFT HEART CATHETERIZATION WITH CORONARY ANGIOGRAM N/A 12/31/2013   Procedure: LEFT HEART CATHETERIZATION WITH CORONARY ANGIOGRAM;  Surgeon: Wellington Hampshire, MD;  Location: [place] CATH LAB;  Service: Cardiovascular;  Laterality: N/A;     OB History   No obstetric history on file.     Family History  Problem Relation Age of Onset   Stroke Mother    Other Mother        brain tumor   Other Father        MI   Heart attack Father    Stroke Sister    Macular degeneration Sister    Diabetes Daughter    Cancer Daughter        ovarian   Colon polyps Daughter    Atrial fibrillation Sister    Hyperlipidemia Sister    Osteoporosis Sister    Stroke Sister    Uterine cancer Sister    Heart attack Brother    Heart disease Brother    Asthma Brother    Breast cancer Neg Hx     Social History   Tobacco Use   Smoking status: Never   Smokeless tobacco: Never  Vaping Use   Vaping Use: Never used  Substance Use Topics   Alcohol use: No   Drug use:  No    Home Medications Prior to Admission medications   Medication Sig Start Date End Date Taking? Authorizing Provider  acetaminophen (TYLENOL) 500 MG tablet Take 1 tablet (500 mg total) by mouth every 6 (six) hours as needed for mild pain, fever or headache. 04/05/20   Wynetta Emery, Clanford L, MD  amiodarone (PACERONE) 200 MG tablet Take 200 mg by mouth daily. Patient not taking: Reported on 06/29/2020 06/16/20   [provider]  apixaban (ELIQUIS) 2.5 MG TABS tablet Take 1 tablet (2.5 mg total) by mouth 2 (two) times daily. 02/11/20   Croitoru, Mihai, MD  carboxymethylcellulose (REFRESH PLUS) 0.5 % SOLN Place 1 drop into both eyes 3 (three) times daily as needed (dry/irritated eyes.).    [provider]  Cholecalciferol (VITAMIN D) 50 MCG (2000 UT) CAPS Take 2,000 Units by mouth every morning.    [provider]  feeding supplement (ENSURE ENLIVE / ENSURE PLUS) LIQD Take 237 mLs by mouth 2 (two) times daily between meals. 12/26/19   Mikhail, Velta Addison, DO  HYDROcodone-acetaminophen (NORCO/VICODIN) 5-325 MG tablet TAKE 1 TABLET EVERY 4 HOURS AS NEEDED FOR MODERATE PAIN 05/11/20   Cleon Gustin, MD  isosorbide mononitrate (IMDUR) 30 MG 24 hr tablet Take 1 tablet (30 mg total) by mouth daily. 06/23/20   Roxan Hockey, MD  levothyroxine (SYNTHROID) 100 MCG tablet Take 1 tablet (100 mcg total) by mouth daily. Patient taking differently: Take 100 mcg by mouth every morning. 01/27/20   Croitoru, Dani Gobble, MD  loratadine (CLARITIN) 10 MG tablet Take 1 tablet (10 mg total) by mouth daily as needed for allergies, rhinitis or itching. (for itching) 04/05/20   Johnson, Clanford L, MD  MELATONIN PO Take 3 mg by mouth at bedtime.    [provider]  metoprolol tartrate (LOPRESSOR) 25 MG tablet Take 0.5 tablets (12.5 mg total) by mouth 2 (two) times daily. HOLD FOR SBP<95 OR HR<65 Patient not taking: Reported on  06/29/2020 04/05/20   Murlean Iba, MD  Multiple  Vitamins-Minerals (CENTRUM SILVER ULTRA WOMENS PO) Take 1 tablet by mouth every morning.    [provider]  nitroGLYCERIN (NITROSTAT) 0.4 MG SL tablet Place 1 tablet (0.4 mg total) under the tongue every 5 (five) minutes as needed for chest pain. 12/20/18   Claretta Fraise, MD  pantoprazole (PROTONIX) 40 MG tablet Take 1 tablet (40 mg total) by mouth 2 (two) times daily before a meal. Patient taking differently: Take 40 mg by mouth every morning. 04/05/20   Johnson, Clanford L, MD  PARoxetine (PAXIL) 20 MG tablet TAKE (1) TABLET DAILY IN THE MORNING. Patient taking differently: Take 20 mg by mouth every morning. 03/29/20   [name], DO  polyethylene glycol (MIRALAX / GLYCOLAX) 17 g packet Take 17 g by mouth 2 (two) times daily. 06/23/20   Roxan Hockey, MD  rosuvastatin (CRESTOR) 10 MG tablet Take 1 tablet (10 mg total) by mouth daily. Patient taking differently: Take 10 mg by mouth at bedtime. 02/24/20   [name], DO  senna-docusate (SENOKOT-S) 8.6-50 MG tablet Take 2 tablets by mouth 2 (two) times daily. Patient not taking: No sig reported 06/23/20 06/23/21  Roxan Hockey, MD    Allergies    Sotalol, Tramadol, Actos [pioglitazone], Adhesive [tape], Atorvastatin, Bactrim [sulfamethoxazole-trimethoprim], Ciprofloxacin, Contrast media [iodinated diagnostic agents], Latex, Pedi-pre tape spray [wound dressing adhesive], and Sulfa antibiotics  Review of Systems   Review of Systems  Constitutional:  Positive for appetite change (Chronic) and chills. Negative for fever.  Respiratory:  Negative for chest tightness and shortness of breath.   Cardiovascular:  Positive for chest pain.  Gastrointestinal:  Positive for abdominal pain and rectal pain. Negative for diarrhea, nausea and vomiting.  Genitourinary:  Negative for dysuria.  Musculoskeletal:  Negative for back pain and neck pain.  Neurological:  Positive for numbness. Negative for speech difficulty and weakness.  All  other systems reviewed and are negative.  Physical Exam Updated Vital Signs BP (!) 167/79   Pulse 65   Temp 97.9 F (36.6 C)   Resp 16   Ht '5\' 3"'$  (1.6 m)   Wt 57 kg   SpO2 99%   BMI 22.26 kg/m   Physical Exam Vitals and nursing note reviewed.  Constitutional:      General: She is not in acute distress.    Appearance: She is not ill-appearing.  HENT:     Head: Atraumatic.  Eyes:     Conjunctiva/sclera: Conjunctivae normal.  Cardiovascular:     Rate and Rhythm: Normal rate. Rhythm irregular.     Comments: 2+ DP/PT pulses left lower extremity Pulmonary:     Effort: Pulmonary effort is normal. No respiratory distress.  Abdominal:     General: There is no distension.     Tenderness: There is abdominal tenderness. There is no guarding.  Musculoskeletal:     Cervical back: Normal range of motion and neck supple.     Comments: No obvious acute injury.  Patient is able to move feet bilaterally with 5/5 plantar flexion and dorsiflexion bilaterally.  Left calf tenderness to palpation without obvious edema.  Skin:    General: Skin is warm and dry.     Comments: No abnormal erythema or edema of the LLE.  Small ulcer on the bottom of the left great toe with out obvious infection.    Neurological:     Mental Status: She is alert.     Sensory: Sensory deficit (Subjective  decrease sensation to light touch through entire left leg from the hip distal.) present.     Comments: Awake and alert, answers all questions appropriately.  Speech is not slurred.  Psychiatric:        Mood and Affect: Mood normal.        Behavior: Behavior normal.       ED Results / Procedures / Treatments   Labs (all labs ordered are listed, but only abnormal results are displayed) Labs Reviewed  COMPREHENSIVE METABOLIC PANEL - Abnormal; Notable for the following components:      Result Value   Sodium 134 (*)    Glucose, Bld 156 (*)    Creatinine, Ser 1.25 (*)    Total Protein 5.9 (*)    Albumin 3.1 (*)     GFR, Estimated 42 (*)    All other components within normal limits  CBC WITH DIFFERENTIAL/PLATELET - Abnormal; Notable for the following components:   RBC 3.61 (*)    Hemoglobin 11.2 (*)    HCT 34.5 (*)    All other components within normal limits  LACTIC ACID, PLASMA - Abnormal; Notable for the following components:   Lactic Acid, Venous 2.0 (*)    All other components within normal limits  LIPASE, BLOOD  LACTIC ACID, PLASMA  URINALYSIS, ROUTINE W REFLEX MICROSCOPIC  TROPONIN I (HIGH SENSITIVITY)  TROPONIN I (HIGH SENSITIVITY)    EKG EKG Interpretation  Date/Time:  Tuesday July 13 2020 14:25:33 EDT Ventricular Rate:  68 PR Interval:  253 QRS Duration: 133 QT Interval:  417 QTC Calculation: 444 R Axis:   -49 Text Interpretation: Sinus or ectopic atrial rhythm Prolonged PR interval Left bundle branch block No significant change since last tracing Confirmed by Calvert Cantor [id number]) on 07/13/2020 2:27:20 PM  Radiology US Venous Img Lower Bilateral  Result Date: 07/13/2020 CLINICAL DATA:  Left calf and ankle numbness for the past 2 days EXAM: BILATERAL LOWER EXTREMITY VENOUS DOPPLER ULTRASOUND TECHNIQUE: Gray-scale sonography with graded compression, as well as color Doppler and duplex ultrasound were performed to evaluate the lower extremity deep venous systems from the level of the common femoral vein and including the common femoral, femoral, profunda femoral, popliteal and calf veins including the posterior tibial, peroneal and gastrocnemius veins when visible. The superficial great saphenous vein was also interrogated. Spectral Doppler was utilized to evaluate flow at rest and with distal augmentation maneuvers in the common femoral, femoral and popliteal veins. COMPARISON:  None. FINDINGS: RIGHT LOWER EXTREMITY Common Femoral Vein: No evidence of thrombus. Normal compressibility, respiratory phasicity and response to augmentation. Saphenofemoral Junction: No evidence of  thrombus. Normal compressibility and flow on color Doppler imaging. Profunda Femoral Vein: No evidence of thrombus. Normal compressibility and flow on color Doppler imaging. Femoral Vein: No evidence of thrombus. Normal compressibility, respiratory phasicity and response to augmentation. Popliteal Vein: No evidence of thrombus. Normal compressibility, respiratory phasicity and response to augmentation. Calf Veins: No evidence of thrombus. Normal compressibility and flow on color Doppler imaging. Superficial Great Saphenous Vein: No evidence of thrombus. Normal compressibility. Venous Reflux:  None. Other Findings:  None. LEFT LOWER EXTREMITY Common Femoral Vein: No evidence of thrombus. Normal compressibility, respiratory phasicity and response to augmentation. Saphenofemoral Junction: No evidence of thrombus. Normal compressibility and flow on color Doppler imaging. Profunda Femoral Vein: No evidence of thrombus. Normal compressibility and flow on color Doppler imaging. Femoral Vein: No evidence of thrombus. Normal compressibility, respiratory phasicity and response to augmentation. Popliteal Vein: No evidence of thrombus. Normal compressibility,  respiratory phasicity and response to augmentation. Calf Veins: No evidence of thrombus. Normal compressibility and flow on color Doppler imaging. Superficial Great Saphenous Vein: No evidence of thrombus. Normal compressibility. Venous Reflux:  None. Other Findings:  None. IMPRESSION: No evidence of deep venous thrombosis in either lower extremity. Electronically Signed   By: Jacqulynn Cadet M.D.   On: 07/13/2020 15:34   CT Angio Chest/Abd/Pel for Dissection W and/or Wo Contrast  Result Date: 07/13/2020 CLINICAL DATA:  87 year old female with abdominal pain. Concern for aortic dissection. EXAM: CT ANGIOGRAPHY CHEST, ABDOMEN AND PELVIS TECHNIQUE: Non-contrast CT of the chest was initially obtained. Multidetector CT imaging through the chest, abdomen and pelvis was  performed using the standard protocol during bolus administration of intravenous contrast. Multiplanar reconstructed images and MIPs were obtained and reviewed to evaluate the vascular anatomy. CONTRAST:  25m OMNIPAQUE IOHEXOL 350 MG/ML SOLN COMPARISON:  Several prior CT of the abdomen pelvis dating back to 03/28/2020. FINDINGS: CTA CHEST FINDINGS Cardiovascular: There is no cardiomegaly or pericardial effusion. Mild atherosclerotic calcification of the thoracic aorta. No aneurysmal dilatation or dissection. The origins of the great vessels of the aortic arch appear patent as visualized. Left pectoral pacemaker device. The central pulmonary arteries are grossly unremarkable for the degree of opacification. Mediastinum/Nodes: No hilar or mediastinal adenopathy. The esophagus and the thyroid gland are grossly unremarkable. No mediastinal fluid collection. Lungs/Pleura: No focal consolidation, pleural effusion, or pneumothorax. There is a 4 mm right lower lobe subpleural nodule (107/7). The central airways are patent. Musculoskeletal: Osteopenia with degenerative changes of the spine. No acute osseous pathology. Review of the MIP images confirms the above findings. CTA ABDOMEN AND PELVIS FINDINGS VASCULAR Aorta: There is advanced aortoiliac atherosclerotic disease. Old appearing focal dissection flap or a penetrating ulcer in the distal abdominal aorta. The lumen of the aorta measures up to 2.1 cm at this level. No acute dissection. No aneurysm. No periaortic fluid collection or hematoma. Celiac: The celiac artery and its major branches are patent. SMA: Patent without evidence of aneurysm, dissection, vasculitis or significant stenosis. Renals: There is atherosclerotic calcification of the origins of the renal arteries. The renal arteries remain patent. There is irregularity of the right renal artery which may represent fibromuscular dysplasia. IMA: The IMA is patent. Inflow: The iliac arteries are patent. No  aneurysmal dilatation or dissection. Veins: No obvious venous abnormality within the limitations of this arterial phase study. Review of the MIP images confirms the above findings. NON-VASCULAR No intra-abdominal free air or free fluid. Hepatobiliary: The liver is unremarkable. No intrahepatic biliary duct dilatation. No calcified gallstone. Pancreas: Indeterminate 7 mm hypodense lesion in the neck of the pancreas (85/5). This may represent a focal area of interspersed fat, a side branch IPMN or a retention cyst. MRI may provide better characterization on a nonemergent/outpatient basis, if clinically indicated. No gland atrophy or dilatation of the main pancreatic duct. No acute inflammation. Spleen: Normal in size without focal abnormality. Adrenals/Urinary Tract: The adrenal glands unremarkable. Moderate left and mild right renal parenchyma atrophy. Nonobstructing bilateral renal calculi measure up to 3 mm in the inferior pole of the right kidney. There is diffuse cortical irregularity and scarring of the kidneys bilaterally. There is mild fullness of the left renal collecting system. The visualized ureters and urinary bladder appear unremarkable. Stomach/Bowel: There is moderate stool throughout the colon. There is no bowel obstruction or active inflammation. The appendix is normal. Lymphatic: No adenopathy. Reproductive: The uterus is anteverted. No adnexal masses. Other: None Musculoskeletal: Osteopenia  with scoliosis and degenerative changes. No acute osseous pathology. Review of the MIP images confirms the above findings. IMPRESSION: 1. No acute intrathoracic, abdominal, or pelvic pathology. No CT evidence of acute aortic dissection or aneurysm. 2. A 7 mm hypodense lesion in the neck of the pancreas, indeterminate. MRI may provide better characterization on a nonemergent/outpatient basis, if clinically indicated. 3. Bilateral renal parenchyma atrophy and cortical irregularity and scarring. Nonobstructing  bilateral renal calculi. No hydronephrosis. 4. No bowel obstruction. Normal appendix. 5. There is a 4 mm right lower lobe subpleural nodule. 6. Aortic Atherosclerosis (ICD10-I70.0). Electronically Signed   By: Anner Crete M.D.   On: 07/13/2020 17:07    Procedures Procedures   Medications Ordered in ED Medications  iohexol (OMNIPAQUE) 350 MG/ML injection 75 mL (75 mLs Intravenous Contrast Given 07/13/20 1629)  sodium chloride 0.9 % bolus 500 mL (0 mLs Intravenous Stopped 07/13/20 1845)    ED Course  I have reviewed the triage vital signs and the nursing notes.  Pertinent labs & imaging results that were available during my care of the patient were reviewed by me and considered in my medical decision making (see chart for details).  Clinical Course as of 07/14/20 0027  Tue Jul 13, 2020  1508 CT Angio Chest/Abd/Pel for Dissection W and/or Wo Contrast Patient is noted to have contrast listed as an allergy.  The, and states that this only causes headache and does not require premedication.  On chart review her last contrasted CT scan about 3 months ago she did not receive premedication and was not noted to have any significant reaction.  Patient confirmed this.  Premedication not ordered. [EH]    Clinical Course User Index [EH] Ollen Gross   MDM Rules/Calculators/A&P                          Patient is an 87 year old woman with extensive past medical history who presents today for evaluation of worsening leg pain and numbness in her entire left leg. Subjectively on exam she has decreased sensation through her entire left leg.  She has a known infrarenal aortic dissection.  Was sent in for concern of DVT. DVT study of bilateral lower extremities is negative.  CBC without leukocytosis, CMP without acute abnormalities.  Lipase is not elevated.  Troponin x2 is negative.  EKG without acute ischemia, I UA without evidence of infection.  CTA chest abdomen pelvis for dissection  study is obtained without acute abnormalities.  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 Discussed results with patient and her daughter whom she lives with.  Recommended outpatient follow-up.  The CT scan did not show any evidence of lumbar fracture or compression.    Suspect that she may have a peripheral nerve compression causing her decreased sensation in her entire left leg.  She has great pulses and does not appear to have evidence of ischemia in her legs.  Additionally her toe with a small ulcer does not appear infected today and her redness appears to have resolved.  This patient was seen as a shared visit with Dr. Roderic Palau.   Return precautions were discussed with patient who states their understanding.  At the time of discharge patient denied any unaddressed complaints or concerns.  Patient is agreeable for discharge home.  Note: Portions of this report may have been transcribed using voice recognition software. Every effort was made to ensure accuracy; however, inadvertent computerized transcription errors may be present  Final Clinical Impression(s) / ED Diagnoses Final diagnoses:  Paresthesia and pain of left extremity  Chronic abdominal pain  Lesion of pancreas    Rx / DC Orders ED Discharge Orders     None        Ollen Gross 07/14/20 Purcell Mouton, MD 07/16/20 1029

## 2020-07-14 ENCOUNTER — Telehealth: Payer: Self-pay | Admitting: Family Medicine

## 2020-07-14 NOTE — Telephone Encounter (Signed)
Called patient - appt made with Tiffany for tomorrow

## 2020-07-15 ENCOUNTER — Encounter: Payer: Self-pay | Admitting: Family Medicine

## 2020-07-15 ENCOUNTER — Other Ambulatory Visit: Payer: Self-pay

## 2020-07-15 ENCOUNTER — Ambulatory Visit (INDEPENDENT_AMBULATORY_CARE_PROVIDER_SITE_OTHER): Payer: Medicare Other | Admitting: Family Medicine

## 2020-07-15 VITALS — BP 88/52 | HR 85 | Temp 98.0°F | Ht 63.0 in | Wt 122.0 lb

## 2020-07-15 DIAGNOSIS — Z09 Encounter for follow-up examination after completed treatment for conditions other than malignant neoplasm: Secondary | ICD-10-CM

## 2020-07-15 DIAGNOSIS — E11621 Type 2 diabetes mellitus with foot ulcer: Secondary | ICD-10-CM | POA: Diagnosis not present

## 2020-07-15 DIAGNOSIS — R42 Dizziness and giddiness: Secondary | ICD-10-CM

## 2020-07-15 DIAGNOSIS — G8929 Other chronic pain: Secondary | ICD-10-CM

## 2020-07-15 DIAGNOSIS — M79609 Pain in unspecified limb: Secondary | ICD-10-CM

## 2020-07-15 DIAGNOSIS — R202 Paresthesia of skin: Secondary | ICD-10-CM

## 2020-07-15 DIAGNOSIS — R109 Unspecified abdominal pain: Secondary | ICD-10-CM | POA: Diagnosis not present

## 2020-07-15 DIAGNOSIS — K869 Disease of pancreas, unspecified: Secondary | ICD-10-CM | POA: Diagnosis not present

## 2020-07-15 DIAGNOSIS — I10 Essential (primary) hypertension: Secondary | ICD-10-CM

## 2020-07-15 DIAGNOSIS — I4891 Unspecified atrial fibrillation: Secondary | ICD-10-CM | POA: Diagnosis not present

## 2020-07-15 DIAGNOSIS — L97528 Non-pressure chronic ulcer of other part of left foot with other specified severity: Secondary | ICD-10-CM | POA: Diagnosis not present

## 2020-07-15 DIAGNOSIS — Z95810 Presence of automatic (implantable) cardiac defibrillator: Secondary | ICD-10-CM | POA: Diagnosis not present

## 2020-07-15 DIAGNOSIS — I25119 Atherosclerotic heart disease of native coronary artery with unspecified angina pectoris: Secondary | ICD-10-CM | POA: Diagnosis not present

## 2020-07-15 DIAGNOSIS — L03116 Cellulitis of left lower limb: Secondary | ICD-10-CM | POA: Diagnosis not present

## 2020-07-15 NOTE — Patient Instructions (Addendum)
Hold metoprolol tartrate if top number of blood pressure is 95 or less or if heart rate is 65 or less.  Try cream on leg for pain.  Hypotension As your heart beats, it forces blood through your body. Hypotension, commonly called low blood pressure, is when the force of blood pumping through your arteries is too weak. Arteries are blood vessels that carry blood from the heart throughout the body. Depending on the cause and severity, hypotension may be harmless (benign) or may cause serious problems (be critical). When blood pressure is too low, you may not get enough blood to your brain or to the rest of your organs. This can cause weakness, light-headedness, rapidheartbeat, and fainting. What are the causes? This condition may be caused by: Blood loss. Loss of body fluids (dehydration). Heart problems. Hormone (endocrine) problems. Pregnancy. Severe infection. Lack of certain nutrients. Severe allergic reactions (anaphylaxis). Certain medicines, such as blood pressure medicine or medicines that make the body lose excess fluids (diuretics). Sometimes, hypotension may be caused by not taking medicine as directed, such as taking too much of a certain medicine. What increases the risk? The following factors may make you more likely to develop this condition: Age. Risk increases as you get older. Conditions that affect the heart or the central nervous system. Taking certain medicines, such as blood pressure medicine or diuretics. Being pregnant. What are the signs or symptoms? Common symptoms of this condition include: Weakness. Light-headedness. Dizziness. Blurred vision. Fatigue. Rapid heartbeat. Fainting, in severe cases. How is this diagnosed? This condition is diagnosed based on: Your medical history. Your symptoms. Your blood pressure measurement. Your health care provider will check your blood pressure when you are: Lying down. Sitting. Standing. A blood pressure reading is  recorded as two numbers, such as "120 over 80" (or 120/80). The first ("top") number is called the systolic pressure. It is a measure of the pressure in your arteries as your heart beats. The second ("bottom") number is called the diastolic pressure. It is a measure of the pressure in your arteries when your heart relaxes between beats. Blood pressure is measured in a unit called mm Hg. Healthy blood pressure for most adults is 120/80. If your blood pressure is below 90/60, you may be diagnosed withhypotension. Other information or tests that may be used to diagnose hypotension include: Your other vital signs, such as your heart rate and temperature. Blood tests. Tilt table test. For this test, you will be safely secured to a table that moves you from a lying position to an upright position. Your heart rhythm and blood pressure will be monitored during the test. How is this treated? Treatment for this condition may include: Changing your diet. This may involve eating more salt (sodium) or drinking more water. Taking medicines to raise your blood pressure. Changing the dosage of certain medicines you are taking that might be lowering your blood pressure. Wearing compression stockings. These stockings help to prevent blood clots and reduce swelling in your legs. In some cases, you may need to go to the hospital for: Fluid replacement. This means you will receive fluids through an IV. Blood replacement. This means you will receive donated blood through an IV (transfusion). Treating an infection or heart problems, if this applies. Monitoring. You may need to be monitored while medicines that you are taking wear off. Follow these instructions at home: Eating and drinking  Drink enough fluid to keep your urine pale yellow. Eat a healthy diet, and follow instructions from  your health care provider about eating or drinking restrictions. A healthy diet includes: Fresh fruits and vegetables. Whole  grains. Lean meats. Low-fat dairy products. Eat extra salt only as directed. Do not add extra salt to your diet unless your health care provider told you to do that. Eat frequent, small meals. Avoid standing up suddenly after eating.  Medicines Take over-the-counter and prescription medicines only as told by your health care provider. Follow instructions from your health care provider about changing the dosage of your current medicines, if this applies. Do not stop or adjust any of your medicines on your own. General instructions  Wear compression stockings as told by your health care provider. Get up slowly from lying down or sitting positions. This gives your blood pressure a chance to adjust. Avoid hot showers and excessive heat as directed by your health care provider. Return to your normal activities as told by your health care provider. Ask your health care provider what activities are safe for you. Do not use any products that contain nicotine or tobacco, such as cigarettes, e-cigarettes, and chewing tobacco. If you need help quitting, ask your health care provider. Keep all follow-up visits as told by your health care provider. This is important.  Contact a health care provider if you: Vomit. Have diarrhea. Have a fever for more than 2-3 days. Feel more thirsty than usual. Feel weak and tired. Get help right away if you: Have chest pain. Have a fast or irregular heartbeat. Develop numbness in any part of your body. Cannot move your arms or your legs. Have trouble speaking. Become sweaty or feel light-headed. Faint. Feel short of breath. Have trouble staying awake. Feel confused. Summary Hypotension is when the force of blood pumping through your arteries is too weak. Hypotension may be harmless (benign) or may cause serious problems (be critical). Treatment for this condition may include changing your diet, changing your medicines, and wearing compression  stockings. In some cases, you may need to go to the hospital for fluid or blood replacement. This information is not intended to replace advice given to you by your health care provider. Make sure you discuss any questions you have with your healthcare provider. Document Revised: 06/28/2017 Document Reviewed: 06/28/2017 [employer] Patient Education  [employer].

## 2020-07-15 NOTE — Progress Notes (Signed)
Established Patient Office Visit  Subjective:  Patient ID: [name], female    DOB: [birth year]  Age: 87 y.o. MRN: [id number]  CC:  Chief Complaint  Patient presents with   Follow-up    HPI [name] presents for hospital follow. [name] was seen in the ED yesterday for a concern for a DVT. [name] notified [facility] that her toe was turning blue and that her leg was swollen and painful. She was advised to go to the ED to rule out a DVT. She also reported numbness in her left leg. Studies were negative for DVT bilaterally. Labs, EKG, and UA were unremarkable. Peripheral nerve compression was suspected to be the cause of her leg pain and decreased sensation. A cream was prescribed for the patient today, and she will pick this up after her visit today. Denies saddle anesthesia.   She also reported abdominal pain in the ED. A CTA chest/abdomen/pelvis was done without acute abnormalities. A known pancreatic mass was noted without changes. [name] is managed by GI for chronic abdominal pain and they have also been monitoring the pancreatic mass. GI suspects microvascular angina vs bowel hypersensitivity as the pain of her complaints. No evidence of lumbar fracture or compression was noted. Denies nausea or vomiting.   The ulcer on her toe did not appear infected. This is being managed by podiatry. She will follow up with them next week. She has been keeping the toe dressed. Denies fever, drainage, erythema, or tenderness.   She has been feeling some intermittent dizziness and lightheadedness. She has been trying to stay well hydrated. She does not eat much due to her GI ailment's but does drink ensure regularly. Denies chest pain, shortness of breath, or edema.   Past Medical History:  Diagnosis Date   AICD (automatic cardioverter/defibrillator) present    Allergy    SESONAL   Aneurysm of infrarenal abdominal aorta (HCC)    Anxiety    ARTHRITIS    Arthritis    Atrial  fibrillation (HCC)    CAD (coronary artery disease)    a. history of cardiac arrest 1993;  b. s/p LAD/LCX stenting in 2003;  c. 07/2011 Cath: LM nl, LAD patent stent, D1 80ost, LCX patent stent, RCA min irregs;  d. 04/2012 MV: EF 66%, no ishcemia;  e. 12/2013 Echo: Ef 55%, no rwma, Gr 1 DD, triv AI/MR, mildly dil LA;  f. 12/2013 Lexi MV: intermediate risk - apical ischemia and inf/infsept fixed defect, ? artifactual.   CAD in native artery 12/31/2013   Previous stents to LAD and Ramus patent on cath today 12/31/13 also There is severe disease in the ostial first diagonal which is unchanged from most recent cardiac catheterization. The right coronary artery could not be engaged selectively but nonselective angiography showed no significant disease in the proximal and midsegment.       Cataract    DENIES   Chronic back pain    Chronic sinus bradycardia    CKD (chronic kidney disease), stage III (HCC)    Clotting disorder ([place])    DVT   COLITIS 12/02/2007   Qualifier: Diagnosis of  By: [name], [name]     Diabetes mellitus without complication ([place])    DENIES   Diverticulosis of colon (without mention of hemorrhage) 2007   Colonoscopy    Esophageal stricture    a. 2012 s/p dil.   Esophagitis, unspecified    a. 2012 EGD   EXTERNAL HEMORRHOIDS    GERD (  gastroesophageal reflux disease)    omeprazole   H/O hiatal hernia    Hiatal hernia    a. 2012 EGD.   History of DVT in the past, not on Coumadin now    left  leg   HYPERCHOLESTEROLEMIA    Hypertension    ICD (implantable cardiac defibrillator) in place    a. s/p initial ICD in 1993 in setting of cardiac arrest;  b. 01/2007 gen change: Guidant T135 Vitality DS VR single lead ICD.   Macular degeneration    gets injecton in eye every 5 weeks- last injection - 05/03/2013    Myocardial infarction [facility]) 1993   Near syncope 05/22/2015   Nephrolithiasis, just saw Dr [name]    Orthostatic hypotension    Peripheral vascular disease  ([place])    ???   RECTAL BLEEDING 12/03/2007   Qualifier: Diagnosis of  By: [name]    Unspecified gastritis and gastroduodenitis without mention of hemorrhage    a. 2003 EGD->not noted on 2012 EGD.   Unstable angina, neg MI, cath stable.maybe GI 12/30/2013    Past Surgical History:  Procedure Laterality Date   BREAST BIOPSY Left 11/2013   CARDIAC CATHETERIZATION     CARDIAC CATHETERIZATION  01/01/15   patent stents -there is severe disease in ostial 1st diag but without change.    CARDIAC DEFIBRILLATOR PLACEMENT  02/05/2007   Guidant   CARDIOVASCULAR STRESS TEST  12/30/2013   abnormal   CARDIOVERSION N/A 02/13/2019   Procedure: CARDIOVERSION;  Surgeon: Sanda Klein, MD;  Location: [place];  Service: Cardiovascular;  Laterality: N/A;   CATARACT EXTRACTION Right    Dr. Katy Fitch   COLONOSCOPY     COLONOSCOPY WITH PROPOFOL N/A 03/30/2020   Procedure: COLONOSCOPY WITH PROPOFOL;  Surgeon: Harvel Quale, MD;  [facility];  Service: Gastroenterology;  Laterality: N/A;   coronary stents      CYSTOSCOPY WITH URETEROSCOPY Right 05/20/2013   Procedure: CYSTOSCOPY, RIGHT URETEROSCOPY STONE EXTRACTION, Insertion of right DOUBLE J STENT ;  Surgeon: Irine Seal, MD;  Location: [facility];  Service: Urology;  Laterality: Right;   CYSTOSCOPY WITH URETEROSCOPY AND STENT PLACEMENT N/A 06/03/2013   Procedure: SECOND LOOK CYSTOSCOPY WITH URETEROSCOPY  HOLMIUM LASER LITHO AND STONE EXTRACTION Sammie Bench ;  Surgeon: Malka So, MD;  Location: [facility];  Service: Urology;  Laterality: N/A;   ESOPHAGOGASTRODUODENOSCOPY (EGD) WITH PROPOFOL N/A 11/20/2019   Procedure: ESOPHAGOGASTRODUODENOSCOPY (EGD) WITH PROPOFOL;  Surgeon: Daneil Dolin, MD;  [facility];  Service: Endoscopy;  Laterality: N/A;   GIVENS CAPSULE STUDY N/A 10/30/2016   Procedure: GIVENS CAPSULE STUDY;  Surgeon: Irene Shipper, MD;  Location: [facility] ENDOSCOPY;  Service: Endoscopy;  Laterality: N/A;  NEEDS  TO BE ADMITTED FOR OBSERVATION PT. HAS DEFIB   HEMORROIDECTOMY  80's   HOLMIUM LASER APPLICATION Right 09/21/930   Procedure: HOLMIUM LASER APPLICATION;  Surgeon: Irine Seal, MD;  Location: [facility];  Service: Urology;  Laterality: Right;   HYSTEROSCOPY WITH D & C  09/02/2010   Procedure: DILATATION AND CURETTAGE (D&C) /HYSTEROSCOPY;  Surgeon: Margarette Asal;  Location: [place] [facility];  Service: Gynecology;  Laterality: N/A;  Dilation and Curettage with Hysteroscopy and Polypectomy   IMPLANTABLE CARDIOVERTER DEFIBRILLATOR (ICD) GENERATOR CHANGE N/A 02/10/2014   Procedure: ICD GENERATOR CHANGE;  Surgeon: Sanda Klein, MD;  Location: [facility];  Service: Cardiovascular;  Laterality: N/A;   LEFT HEART CATHETERIZATION WITH CORONARY ANGIOGRAM N/A 07/28/2011   Procedure: LEFT HEART  CATHETERIZATION WITH CORONARY ANGIOGRAM;  Surgeon: Lorretta Harp, MD;  Location: [facility] CATH LAB;  Service: Cardiovascular;  Laterality: N/A;   LEFT HEART CATHETERIZATION WITH CORONARY ANGIOGRAM N/A 12/31/2013   Procedure: LEFT HEART CATHETERIZATION WITH CORONARY ANGIOGRAM;  Surgeon: Wellington Hampshire, MD;  Location: [place] CATH LAB;  Service: Cardiovascular;  Laterality: N/A;    Family History  Problem Relation Age of Onset   Stroke Mother    Other Mother        brain tumor   Other Father        MI   Heart attack Father    Stroke Sister    Macular degeneration Sister    Diabetes Daughter    Cancer Daughter        ovarian   Colon polyps Daughter    Atrial fibrillation Sister    Hyperlipidemia Sister    Osteoporosis Sister    Stroke Sister    Uterine cancer Sister    Heart attack Brother    Heart disease Brother    Asthma Brother    Breast cancer Neg Hx     Social History   Socioeconomic History   Marital status: Widowed    Spouse name: Not on file   Number of children: 4   Years of education: 8   Highest education level: 8th grade  Occupational History   Occupation: Retired  Tobacco Use   Smoking status:  Never   Smokeless tobacco: Never  Vaping Use   Vaping Use: Never used  Substance and Sexual Activity   Alcohol use: No   Drug use: No   Sexual activity: Never    Birth control/protection: Post-menopausal  Other Topics Concern   Not on file  Social History Narrative   Not on file   Social Determinants of Health   Financial Resource Strain: Not on file  Food Insecurity: Not on file  Transportation Needs: Not on file  Physical Activity: Not on file  Stress: Not on file  Social Connections: Not on file  Intimate Partner Violence: Not on file    Outpatient Medications Prior to Visit  Medication Sig Dispense Refill   acetaminophen (TYLENOL) 500 MG tablet Take 1 tablet (500 mg total) by mouth every 6 (six) hours as needed for mild pain, fever or headache. 30 tablet 0   amiodarone (PACERONE) 200 MG tablet Take 200 mg by mouth daily.     apixaban (ELIQUIS) 2.5 MG TABS tablet Take 1 tablet (2.5 mg total) by mouth 2 (two) times daily. 60 tablet 5   carboxymethylcellulose (REFRESH PLUS) 0.5 % SOLN Place 1 drop into both eyes 3 (three) times daily as needed (dry/irritated eyes.).     Cholecalciferol (VITAMIN D) 50 MCG (2000 UT) CAPS Take 2,000 Units by mouth every morning.     feeding supplement (ENSURE ENLIVE / ENSURE PLUS) LIQD Take 237 mLs by mouth 2 (two) times daily between meals.     HYDROcodone-acetaminophen (NORCO/VICODIN) 5-325 MG tablet TAKE 1 TABLET EVERY 4 HOURS AS NEEDED FOR MODERATE PAIN 30 tablet 0   isosorbide mononitrate (IMDUR) 30 MG 24 hr tablet Take 1 tablet (30 mg total) by mouth daily. 90 tablet 2   levothyroxine (SYNTHROID) 100 MCG tablet Take 1 tablet (100 mcg total) by mouth daily. (Patient taking differently: Take 100 mcg by mouth every morning.) 30 tablet 5   loratadine (CLARITIN) 10 MG tablet Take 1 tablet (10 mg total) by mouth daily as needed for allergies, rhinitis or itching. (for itching) 30 tablet  11   MELATONIN PO Take 3 mg by mouth at bedtime.      metoprolol tartrate (LOPRESSOR) 25 MG tablet Take 0.5 tablets (12.5 mg total) by mouth 2 (two) times daily. HOLD FOR SBP<95 OR HR<65     Multiple Vitamins-Minerals (CENTRUM SILVER ULTRA WOMENS PO) Take 1 tablet by mouth every morning.     nitroGLYCERIN (NITROSTAT) 0.4 MG SL tablet Place 1 tablet (0.4 mg total) under the tongue every 5 (five) minutes as needed for chest pain. 25 tablet 2   pantoprazole (PROTONIX) 40 MG tablet Take 1 tablet (40 mg total) by mouth 2 (two) times daily before a meal. (Patient taking differently: Take 40 mg by mouth every morning.) 90 tablet 0   PARoxetine (PAXIL) 20 MG tablet TAKE (1) TABLET DAILY IN THE MORNING. (Patient taking differently: Take 20 mg by mouth every morning.) 90 tablet 0   polyethylene glycol (MIRALAX / GLYCOLAX) 17 g packet Take 17 g by mouth 2 (two) times daily. 60 each 2   rosuvastatin (CRESTOR) 10 MG tablet Take 1 tablet (10 mg total) by mouth daily. (Patient taking differently: Take 10 mg by mouth at bedtime.) 90 tablet 1   senna-docusate (SENOKOT-S) 8.6-50 MG tablet Take 2 tablets by mouth 2 (two) times daily. 60 tablet 3   No facility-administered medications prior to visit.    Allergies  Allergen Reactions   Sotalol Other (See Comments)    Torsades    Tramadol Other (See Comments)    Hallucination, bad-dream   Actos [Pioglitazone] Swelling   Adhesive [Tape] Other (See Comments)    Causes sores   Atorvastatin Other (See Comments)    myalgia   Bactrim [Sulfamethoxazole-Trimethoprim] Itching and Rash    Itchy rash   Ciprofloxacin Nausea Only   Contrast Media [Iodinated Diagnostic Agents] Other (See Comments)    Headache (no action/pre-med required)   Latex Rash   Pedi-Pre Tape Spray [Wound Dressing Adhesive] Other (See Comments)    Causes sores   Sulfa Antibiotics Other (See Comments)    Causes sores    ROS Review of Systems As per HPI.    Objective:    Physical Exam Vitals and nursing note reviewed.  Constitutional:       General: She is not in acute distress.    Appearance: She is not ill-appearing, toxic-appearing or diaphoretic.  Cardiovascular:     Rate and Rhythm: Normal rate. Rhythm irregular.     Heart sounds: Normal heart sounds. No murmur heard. Pulmonary:     Effort: Pulmonary effort is normal. No respiratory distress.     Breath sounds: Normal breath sounds.  Abdominal:     General: Bowel sounds are normal. There is no distension.     Palpations: Abdomen is soft.     Tenderness: There is no abdominal tenderness. There is no guarding or rebound.  Musculoskeletal:     Right lower leg: No edema.     Left lower leg: No edema.  Skin:    General: Skin is warm and dry.     Findings: Wound (ulcer on left toe. No signs of infection) present. No erythema or rash.  Neurological:     Mental Status: She is alert and oriented to person, place, and time.     Sensory: No sensory deficit.  Psychiatric:        Mood and Affect: Mood normal.        Behavior: Behavior normal.    BP (!) 88/52   Pulse 85   Temp  98 F (36.7 C) (Oral)   Ht _0  (1.6 m)   Wt 122 lb (55.3 kg)   SpO2 98%   BMI 21.61 kg/m  Wt Readings from Last 3 Encounters:  07/15/20 122 lb (55.3 kg)  07/13/20 125 lb 10.6 oz (57 kg)  07/06/20 124 lb 12.5 oz (56.6 kg)     Health Maintenance Due  Topic Date Due   COVID-19 Vaccine (1) Never done   Zoster Vaccines- Shingrix (1 of 2) Never done   FOOT EXAM  10/23/2019   URINE MICROALBUMIN  10/23/2019   HEMOGLOBIN A1C  02/15/2020   TETANUS/TDAP  05/08/2020    There are no preventive care reminders to display for this patient.  Lab Results  Component Value Date   TSH 1.830 03/23/2020   Lab Results  Component Value Date   WBC 4.9 07/13/2020   HGB 11.2 (L) 07/13/2020   HCT 34.5 (L) 07/13/2020   MCV 95.6 07/13/2020   PLT 170 07/13/2020   Lab Results  Component Value Date   NA 134 (L) 07/13/2020   K 4.5 07/13/2020   CO2 27 07/13/2020   GLUCOSE 156 (H) 07/13/2020   BUN 22  07/13/2020   CREATININE 1.25 (H) 07/13/2020   BILITOT 0.8 07/13/2020   ALKPHOS 65 07/13/2020   AST 22 07/13/2020   ALT 14 07/13/2020   PROT 5.9 (L) 07/13/2020   ALBUMIN 3.1 (L) 07/13/2020   CALCIUM 9.0 07/13/2020   ANIONGAP 7 07/13/2020   EGFR 38 (L) 06/29/2020   GFR 45.49 (L) 12/08/2013   Lab Results  Component Value Date   CHOL 164 12/23/2019   Lab Results  Component Value Date   HDL 66 12/23/2019   Lab Results  Component Value Date   LDLCALC 86 12/23/2019   Lab Results  Component Value Date   TRIG 61 12/23/2019   Lab Results  Component Value Date   CHOLHDL 2.5 12/23/2019   Lab Results  Component Value Date   HGBA1C 6.6 08/15/2019      Assessment & Plan:   [name] was seen today for follow-up.  Diagnoses and all orders for this visit:  Paresthesia and pain of left extremity Negative for DVT in ED. Sensation intact. Unremarkable labs. Suspect to be peripheral nerve compression. No evidence of ischemia today. Has been prescribed a cream for nerve pain that she will pick up today to try.   Chronic abdominal pain Managed by GI. No acute changes on CT yesterday.   Lesion of pancreas Managed by GI. Stable on CT.   Essential hypertension Dizziness BP is low today at 88/52. Discussed increasing hydration. Instructed to hold metroprolol for systolic <63 or HR <89. She will check BP at home BID.   Hospital discharge follow-up Reviewed ED record.    Follow-up: Return in about 5 days (around 07/20/2020) for BP follow up. Sooner for new or worsening symptoms.   The patient indicates understanding of these issues and agrees with the plan.   Gwenlyn Perking, FNP

## 2020-07-16 DIAGNOSIS — I5032 Chronic diastolic (congestive) heart failure: Secondary | ICD-10-CM | POA: Diagnosis not present

## 2020-07-16 DIAGNOSIS — K8689 Other specified diseases of pancreas: Secondary | ICD-10-CM | POA: Diagnosis not present

## 2020-07-16 DIAGNOSIS — N1832 Chronic kidney disease, stage 3b: Secondary | ICD-10-CM | POA: Diagnosis not present

## 2020-07-16 DIAGNOSIS — D638 Anemia in other chronic diseases classified elsewhere: Secondary | ICD-10-CM | POA: Diagnosis not present

## 2020-07-16 DIAGNOSIS — E871 Hypo-osmolality and hyponatremia: Secondary | ICD-10-CM | POA: Diagnosis not present

## 2020-07-16 DIAGNOSIS — I129 Hypertensive chronic kidney disease with stage 1 through stage 4 chronic kidney disease, or unspecified chronic kidney disease: Secondary | ICD-10-CM | POA: Diagnosis not present

## 2020-07-21 ENCOUNTER — Encounter: Payer: Self-pay | Admitting: Family Medicine

## 2020-07-21 ENCOUNTER — Ambulatory Visit (INDEPENDENT_AMBULATORY_CARE_PROVIDER_SITE_OTHER): Payer: Medicare Other | Admitting: Family Medicine

## 2020-07-21 ENCOUNTER — Ambulatory Visit (INDEPENDENT_AMBULATORY_CARE_PROVIDER_SITE_OTHER): Payer: Medicare Other

## 2020-07-21 ENCOUNTER — Other Ambulatory Visit: Payer: Self-pay

## 2020-07-21 VITALS — BP 126/72 | HR 71 | Temp 97.0°F | Ht 63.0 in | Wt 120.0 lb

## 2020-07-21 DIAGNOSIS — R202 Paresthesia of skin: Secondary | ICD-10-CM | POA: Diagnosis not present

## 2020-07-21 DIAGNOSIS — I472 Ventricular tachycardia, unspecified: Secondary | ICD-10-CM

## 2020-07-21 DIAGNOSIS — G8929 Other chronic pain: Secondary | ICD-10-CM

## 2020-07-21 DIAGNOSIS — R109 Unspecified abdominal pain: Secondary | ICD-10-CM

## 2020-07-21 DIAGNOSIS — M79609 Pain in unspecified limb: Secondary | ICD-10-CM | POA: Diagnosis not present

## 2020-07-21 DIAGNOSIS — I10 Essential (primary) hypertension: Secondary | ICD-10-CM

## 2020-07-21 NOTE — Progress Notes (Signed)
Established Patient Office Visit  Subjective:  Patient ID: [name], female    DOB: Feb 22, 1931  Age: 87 y.o. MRN: [id number]  CC:  Chief Complaint  Patient presents with   Blood Pressure Check    HPI [name] presents for BP follow up. She is here with a few caretaker today. She has not been taking her metoprolol since her last visit. Her blood pressures have been stable. She reports that she has been feeling better for the most part, but does feel a little light headed today. Denies chest pain, shortness of breath, edema, or focal weakness. Her caretaker has been working with her to increase her fluid intake and food intake. She has difficulty eating due to chronic abdominal pain. She reports the pain in her left leg has improved.   Past Medical History:  Diagnosis Date   AICD (automatic cardioverter/defibrillator) present    Allergy    SESONAL   Aneurysm of infrarenal abdominal aorta (HCC)    Anxiety    ARTHRITIS    Arthritis    Atrial fibrillation (HCC)    CAD (coronary artery disease)    a. history of cardiac arrest 1993;  b. s/p LAD/LCX stenting in 2003;  c. 07/2011 Cath: LM nl, LAD patent stent, D1 80ost, LCX patent stent, RCA min irregs;  d. 04/2012 MV: EF 66%, no ishcemia;  e. 12/2013 Echo: Ef 55%, no rwma, Gr 1 DD, triv AI/MR, mildly dil LA;  f. 12/2013 Lexi MV: intermediate risk - apical ischemia and inf/infsept fixed defect, ? artifactual.   CAD in native artery 12/31/2013   Previous stents to LAD and Ramus patent on cath today 12/31/13 also There is severe disease in the ostial first diagonal which is unchanged from most recent cardiac catheterization. The right coronary artery could not be engaged selectively but nonselective angiography showed no significant disease in the proximal and midsegment.       Cataract    DENIES   Chronic back pain    Chronic sinus bradycardia    CKD (chronic kidney disease), stage III (HCC)    Clotting disorder ([place])    DVT    COLITIS 12/02/2007   Qualifier: Diagnosis of  By: [name], [name]     Diabetes mellitus without complication ([place])    DENIES   Diverticulosis of colon (without mention of hemorrhage) 2007   Colonoscopy    Esophageal stricture    a. 2012 s/p dil.   Esophagitis, unspecified    a. 2012 EGD   EXTERNAL HEMORRHOIDS    GERD (gastroesophageal reflux disease)    omeprazole   H/O hiatal hernia    Hiatal hernia    a. 2012 EGD.   History of DVT in the past, not on Coumadin now    left  leg   HYPERCHOLESTEROLEMIA    Hypertension    ICD (implantable cardiac defibrillator) in place    a. s/p initial ICD in 1993 in setting of cardiac arrest;  b. 01/2007 gen change: Guidant T135 Vitality DS VR single lead ICD.   Macular degeneration    gets injecton in eye every 5 weeks- last injection - 05/03/2013    Myocardial infarction [facility]) 1993   Near syncope 05/22/2015   Nephrolithiasis, just saw Dr [name]    Orthostatic hypotension    Peripheral vascular disease ([place])    ???   RECTAL BLEEDING 12/03/2007   Qualifier: Diagnosis of  By: Sharlett Iles MD Byrd Hesselbach    Unspecified  gastritis and gastroduodenitis without mention of hemorrhage    a. 2003 EGD->not noted on 2012 EGD.   Unstable angina, neg MI, cath stable.maybe GI 12/30/2013    Past Surgical History:  Procedure Laterality Date   BREAST BIOPSY Left 11/2013   CARDIAC CATHETERIZATION     CARDIAC CATHETERIZATION  01/01/15   patent stents -there is severe disease in ostial 1st diag but without change.    CARDIAC DEFIBRILLATOR PLACEMENT  02/05/2007   Guidant   CARDIOVASCULAR STRESS TEST  12/30/2013   abnormal   CARDIOVERSION N/A 02/13/2019   Procedure: CARDIOVERSION;  Surgeon: Sanda Klein, MD;  Location: [place];  Service: Cardiovascular;  Laterality: N/A;   CATARACT EXTRACTION Right    Dr. Katy Fitch   COLONOSCOPY     COLONOSCOPY WITH PROPOFOL N/A 03/30/2020   Procedure: COLONOSCOPY WITH PROPOFOL;  Surgeon: Harvel Quale, MD;  [facility];  Service: Gastroenterology;  Laterality: N/A;   coronary stents      CYSTOSCOPY WITH URETEROSCOPY Right 05/20/2013   Procedure: CYSTOSCOPY, RIGHT URETEROSCOPY STONE EXTRACTION, Insertion of right DOUBLE J STENT ;  Surgeon: Irine Seal, MD;  Location: [facility];  Service: Urology;  Laterality: Right;   CYSTOSCOPY WITH URETEROSCOPY AND STENT PLACEMENT N/A 06/03/2013   Procedure: SECOND LOOK CYSTOSCOPY WITH URETEROSCOPY  HOLMIUM LASER LITHO AND STONE EXTRACTION Sammie Bench ;  Surgeon: Malka So, MD;  Location: [facility];  Service: Urology;  Laterality: N/A;   ESOPHAGOGASTRODUODENOSCOPY (EGD) WITH PROPOFOL N/A 11/20/2019   Procedure: ESOPHAGOGASTRODUODENOSCOPY (EGD) WITH PROPOFOL;  Surgeon: Daneil Dolin, MD;  [facility];  Service: Endoscopy;  Laterality: N/A;   GIVENS CAPSULE STUDY N/A 10/30/2016   Procedure: GIVENS CAPSULE STUDY;  Surgeon: Irene Shipper, MD;  Location: [facility] ENDOSCOPY;  Service: Endoscopy;  Laterality: N/A;  NEEDS TO BE ADMITTED FOR OBSERVATION PT. HAS DEFIB   HEMORROIDECTOMY  80's   HOLMIUM LASER APPLICATION Right 0/02/7739   Procedure: HOLMIUM LASER APPLICATION;  Surgeon: Irine Seal, MD;  Location: [facility];  Service: Urology;  Laterality: Right;   HYSTEROSCOPY WITH D & C  09/02/2010   Procedure: DILATATION AND CURETTAGE (D&C) /HYSTEROSCOPY;  Surgeon: Margarette Asal;  Location: [place] [facility];  Service: Gynecology;  Laterality: N/A;  Dilation and Curettage with Hysteroscopy and Polypectomy   IMPLANTABLE CARDIOVERTER DEFIBRILLATOR (ICD) GENERATOR CHANGE N/A 02/10/2014   Procedure: ICD GENERATOR CHANGE;  Surgeon: Sanda Klein, MD;  Location: [facility] C[facility];  Service: Cardiovascular;  Laterality: N/A;   LEFT HEART CATHETERIZATION WITH CORONARY ANGIOGRAM N/A 07/28/2011   Procedure: LEFT HEART CATHETERIZATION WITH CORONARY ANGIOGRAM;  Surgeon: Lorretta Harp, MD;  Location: [facility] C[facility] LAB;  Service: Cardiovascular;  Laterality: N/A;   LEFT HEART  CATHETERIZATION WITH CORONARY ANGIOGRAM N/A 12/31/2013   Procedure: LEFT HEART CATHETERIZATION WITH CORONARY ANGIOGRAM;  Surgeon: Wellington Hampshire, MD;  Location: [facility] CATH LAB;  Service: Cardiovascular;  Laterality: N/A;    Family History  Problem Relation Age of Onset   Stroke Mother    Other Mother        brain tumor   Other Father        MI   Heart attack Father    Stroke Sister    Macular degeneration Sister    Diabetes Daughter    Cancer Daughter        ovarian   Colon polyps Daughter    Atrial fibrillation Sister    Hyperlipidemia Sister    Osteoporosis Sister    Stroke Sister  Uterine cancer Sister    Heart attack Brother    Heart disease Brother    Asthma Brother    Breast cancer Neg Hx     Social History   Socioeconomic History   Marital status: Widowed    Spouse name: Not on file   Number of children: 4   Years of education: 8   Highest education level: 8th grade  Occupational History   Occupation: Retired  Tobacco Use   Smoking status: Never   Smokeless tobacco: Never  Vaping Use   Vaping Use: Never used  Substance and Sexual Activity   Alcohol use: No   Drug use: No   Sexual activity: Never    Birth control/protection: Post-menopausal  Other Topics Concern   Not on file  Social History Narrative   Not on file   Social Determinants of Health   Financial Resource Strain: Not on file  Food Insecurity: Not on file  Transportation Needs: Not on file  Physical Activity: Not on file  Stress: Not on file  Social Connections: Not on file  Intimate Partner Violence: Not on file    Outpatient Medications Prior to Visit  Medication Sig Dispense Refill   acetaminophen (TYLENOL) 500 MG tablet Take 1 tablet (500 mg total) by mouth every 6 (six) hours as needed for mild pain, fever or headache. 30 tablet 0   amiodarone (PACERONE) 200 MG tablet Take 200 mg by mouth daily.     apixaban (ELIQUIS) 2.5 MG TABS tablet Take 1 tablet (2.5 mg total) by mouth 2  (two) times daily. 60 tablet 5   carboxymethylcellulose (REFRESH PLUS) 0.5 % SOLN Place 1 drop into both eyes 3 (three) times daily as needed (dry/irritated eyes.).     Cholecalciferol (VITAMIN D) 50 MCG (2000 UT) CAPS Take 2,000 Units by mouth every morning.     feeding supplement (ENSURE ENLIVE / ENSURE PLUS) LIQD Take 237 mLs by mouth 2 (two) times daily between meals.     HYDROcodone-acetaminophen (NORCO/VICODIN) 5-325 MG tablet TAKE 1 TABLET EVERY 4 HOURS AS NEEDED FOR MODERATE PAIN 30 tablet 0   isosorbide mononitrate (IMDUR) 30 MG 24 hr tablet Take 1 tablet (30 mg total) by mouth daily. 90 tablet 2   levothyroxine (SYNTHROID) 100 MCG tablet Take 1 tablet (100 mcg total) by mouth daily. (Patient taking differently: Take 100 mcg by mouth every morning.) 30 tablet 5   loratadine (CLARITIN) 10 MG tablet Take 1 tablet (10 mg total) by mouth daily as needed for allergies, rhinitis or itching. (for itching) 30 tablet 11   MELATONIN PO Take 3 mg by mouth at bedtime.     Multiple Vitamins-Minerals (CENTRUM SILVER ULTRA WOMENS PO) Take 1 tablet by mouth every morning.     nitroGLYCERIN (NITROSTAT) 0.4 MG SL tablet Place 1 tablet (0.4 mg total) under the tongue every 5 (five) minutes as needed for chest pain. 25 tablet 2   pantoprazole (PROTONIX) 40 MG tablet Take 1 tablet (40 mg total) by mouth 2 (two) times daily before a meal. (Patient taking differently: Take 40 mg by mouth every morning.) 90 tablet 0   PARoxetine (PAXIL) 20 MG tablet TAKE (1) TABLET DAILY IN THE MORNING. (Patient taking differently: Take 20 mg by mouth every morning.) 90 tablet 0   polyethylene glycol (MIRALAX / GLYCOLAX) 17 g packet Take 17 g by mouth 2 (two) times daily. 60 each 2   rosuvastatin (CRESTOR) 10 MG tablet Take 1 tablet (10 mg total) by mouth daily. (  Patient taking differently: Take 10 mg by mouth at bedtime.) 90 tablet 1   senna-docusate (SENOKOT-S) 8.6-50 MG tablet Take 2 tablets by mouth 2 (two) times daily. 60  tablet 3   metoprolol tartrate (LOPRESSOR) 25 MG tablet Take 0.5 tablets (12.5 mg total) by mouth 2 (two) times daily. HOLD FOR SBP<95 OR HR<65 (Patient not taking: Reported on 07/21/2020)     SSD 1 % cream Apply topically 2 (two) times daily. (Patient not taking: Reported on 07/21/2020)     No facility-administered medications prior to visit.    Allergies  Allergen Reactions   Sotalol Other (See Comments)    Torsades    Tramadol Other (See Comments)    Hallucination, bad-dream   Actos [Pioglitazone] Swelling   Adhesive [Tape] Other (See Comments)    Causes sores   Atorvastatin Other (See Comments)    myalgia   Bactrim [Sulfamethoxazole-Trimethoprim] Itching and Rash    Itchy rash   Ciprofloxacin Nausea Only   Contrast Media [Iodinated Diagnostic Agents] Other (See Comments)    Headache (no action/pre-med required)   Latex Rash   Pedi-Pre Tape Spray [Wound Dressing Adhesive] Other (See Comments)    Causes sores   Sulfa Antibiotics Other (See Comments)    Causes sores    ROS Review of Systems As per HPI.    Objective:    Physical Exam Vitals and nursing note reviewed.  Constitutional:      General: She is not in acute distress.    Appearance: She is not ill-appearing, toxic-appearing or diaphoretic.  Cardiovascular:     Rate and Rhythm: Normal rate and regular rhythm.     Heart sounds: Normal heart sounds. No murmur heard. Pulmonary:     Effort: Pulmonary effort is normal. No respiratory distress.     Breath sounds: Normal breath sounds.  Musculoskeletal:     Right lower leg: No edema.     Left lower leg: No edema.  Skin:    General: Skin is warm and dry.  Neurological:     General: No focal deficit present.     Mental Status: She is alert and oriented to person, place, and time.     Sensory: No sensory deficit.    BP 126/72   Pulse 71   Temp (!) 97 F (36.1 C) (Oral)   Ht 5' 3"  (1.6 m)   Wt 120 lb (54.4 kg)   BMI 21.26 kg/m  Wt Readings from Last 3  Encounters:  07/21/20 120 lb (54.4 kg)  07/15/20 122 lb (55.3 kg)  07/13/20 125 lb 10.6 oz (57 kg)     Health Maintenance Due  Topic Date Due   COVID-19 Vaccine (1) Never done   Zoster Vaccines- Shingrix (1 of 2) Never done   FOOT EXAM  10/23/2019   URINE MICROALBUMIN  10/23/2019   HEMOGLOBIN A1C  02/15/2020   TETANUS/TDAP  05/08/2020    There are no preventive care reminders to display for this patient.  Lab Results  Component Value Date   TSH 1.830 03/23/2020   Lab Results  Component Value Date   WBC 4.9 07/13/2020   HGB 11.2 (L) 07/13/2020   HCT 34.5 (L) 07/13/2020   MCV 95.6 07/13/2020   PLT 170 07/13/2020   Lab Results  Component Value Date   NA 134 (L) 07/13/2020   K 4.5 07/13/2020   CO2 27 07/13/2020   GLUCOSE 156 (H) 07/13/2020   BUN 22 07/13/2020   CREATININE 1.25 (H) 07/13/2020   BILITOT  0.8 07/13/2020   ALKPHOS 65 07/13/2020   AST 22 07/13/2020   ALT 14 07/13/2020   PROT 5.9 (L) 07/13/2020   ALBUMIN 3.1 (L) 07/13/2020   CALCIUM 9.0 07/13/2020   ANIONGAP 7 07/13/2020   EGFR 38 (L) 06/29/2020   GFR 45.49 (L) 12/08/2013   Lab Results  Component Value Date   CHOL 164 12/23/2019   Lab Results  Component Value Date   HDL 66 12/23/2019   Lab Results  Component Value Date   LDLCALC 86 12/23/2019   Lab Results  Component Value Date   TRIG 61 12/23/2019   Lab Results  Component Value Date   CHOLHDL 2.5 12/23/2019   Lab Results  Component Value Date   HGBA1C 6.6 08/15/2019      Assessment & Plan:   Darlean was seen today for blood pressure check.  Diagnoses and all orders for this visit:  Essential hypertension At goal today. Patient has been holding metoprolol since last visit despite stabilized BPs.  Discussed parameters for metroprolol with caregiver. Check BP prior to giving, hold for SBP <95 or HR <65. Notify for persistent low readings. Discussed hydration and nutrition.   Paresthesia and pain of left  extremity Improved.  Chronic abdominal pain Stable, sees GI. Discussed small, frequent meals. Avoid fried, greasy foods. Drinks 2-3 ensures a day.   Follow-up: Return in about 4 weeks (around 08/18/2020) for follow up with PCP. Sooner for new or worsening symptoms.   The patient indicates understanding of these issues and agrees with the plan.   Gwenlyn Perking, FNP

## 2020-07-22 DIAGNOSIS — L97528 Non-pressure chronic ulcer of other part of left foot with other specified severity: Secondary | ICD-10-CM | POA: Diagnosis not present

## 2020-07-22 DIAGNOSIS — Z95810 Presence of automatic (implantable) cardiac defibrillator: Secondary | ICD-10-CM | POA: Diagnosis not present

## 2020-07-22 DIAGNOSIS — E11621 Type 2 diabetes mellitus with foot ulcer: Secondary | ICD-10-CM | POA: Diagnosis not present

## 2020-07-22 DIAGNOSIS — I25119 Atherosclerotic heart disease of native coronary artery with unspecified angina pectoris: Secondary | ICD-10-CM | POA: Diagnosis not present

## 2020-07-22 DIAGNOSIS — L03116 Cellulitis of left lower limb: Secondary | ICD-10-CM | POA: Diagnosis not present

## 2020-07-22 DIAGNOSIS — L97521 Non-pressure chronic ulcer of other part of left foot limited to breakdown of skin: Secondary | ICD-10-CM | POA: Diagnosis not present

## 2020-07-22 DIAGNOSIS — I4891 Unspecified atrial fibrillation: Secondary | ICD-10-CM | POA: Diagnosis not present

## 2020-07-22 LAB — CUP PACEART REMOTE DEVICE CHECK
Battery Remaining Longevity: 126 mo
Battery Remaining Percentage: 100 %
Brady Statistic RV Percent Paced: 5 %
Date Time Interrogation Session: 20220706091900
HighPow Impedance: 38 Ohm
Implantable Lead Implant Date: 20090120
Implantable Lead Location: 753860
Implantable Lead Model: 157
Implantable Lead Serial Number: 138237
Implantable Pulse Generator Implant Date: 20160126
Lead Channel Impedance Value: 511 Ohm
Lead Channel Pacing Threshold Amplitude: 0.8 V
Lead Channel Pacing Threshold Pulse Width: 0.5 ms
Lead Channel Setting Pacing Amplitude: 2.2 V
Lead Channel Setting Pacing Pulse Width: 0.5 ms
Lead Channel Setting Sensing Sensitivity: 0.6 mV
Pulse Gen Serial Number: 191956

## 2020-07-25 DIAGNOSIS — I25119 Atherosclerotic heart disease of native coronary artery with unspecified angina pectoris: Secondary | ICD-10-CM | POA: Diagnosis not present

## 2020-07-25 DIAGNOSIS — Z95810 Presence of automatic (implantable) cardiac defibrillator: Secondary | ICD-10-CM | POA: Diagnosis not present

## 2020-07-25 DIAGNOSIS — L97528 Non-pressure chronic ulcer of other part of left foot with other specified severity: Secondary | ICD-10-CM | POA: Diagnosis not present

## 2020-07-25 DIAGNOSIS — Z7901 Long term (current) use of anticoagulants: Secondary | ICD-10-CM | POA: Diagnosis not present

## 2020-07-25 DIAGNOSIS — Z86718 Personal history of other venous thrombosis and embolism: Secondary | ICD-10-CM | POA: Diagnosis not present

## 2020-07-25 DIAGNOSIS — E11621 Type 2 diabetes mellitus with foot ulcer: Secondary | ICD-10-CM | POA: Diagnosis not present

## 2020-07-25 DIAGNOSIS — L03116 Cellulitis of left lower limb: Secondary | ICD-10-CM | POA: Diagnosis not present

## 2020-07-25 DIAGNOSIS — I4891 Unspecified atrial fibrillation: Secondary | ICD-10-CM | POA: Diagnosis not present

## 2020-08-06 DIAGNOSIS — L97528 Non-pressure chronic ulcer of other part of left foot with other specified severity: Secondary | ICD-10-CM | POA: Diagnosis not present

## 2020-08-06 DIAGNOSIS — I4891 Unspecified atrial fibrillation: Secondary | ICD-10-CM | POA: Diagnosis not present

## 2020-08-06 DIAGNOSIS — L03116 Cellulitis of left lower limb: Secondary | ICD-10-CM | POA: Diagnosis not present

## 2020-08-06 DIAGNOSIS — I25119 Atherosclerotic heart disease of native coronary artery with unspecified angina pectoris: Secondary | ICD-10-CM | POA: Diagnosis not present

## 2020-08-06 DIAGNOSIS — E11621 Type 2 diabetes mellitus with foot ulcer: Secondary | ICD-10-CM | POA: Diagnosis not present

## 2020-08-06 DIAGNOSIS — Z95810 Presence of automatic (implantable) cardiac defibrillator: Secondary | ICD-10-CM | POA: Diagnosis not present

## 2020-08-11 DIAGNOSIS — M25561 Pain in right knee: Secondary | ICD-10-CM | POA: Diagnosis not present

## 2020-08-11 DIAGNOSIS — M1711 Unilateral primary osteoarthritis, right knee: Secondary | ICD-10-CM | POA: Diagnosis not present

## 2020-08-12 NOTE — Progress Notes (Signed)
Remote ICD transmission.   

## 2020-08-17 DIAGNOSIS — L97521 Non-pressure chronic ulcer of other part of left foot limited to breakdown of skin: Secondary | ICD-10-CM | POA: Diagnosis not present

## 2020-08-18 ENCOUNTER — Other Ambulatory Visit: Payer: Self-pay

## 2020-08-18 ENCOUNTER — Ambulatory Visit (INDEPENDENT_AMBULATORY_CARE_PROVIDER_SITE_OTHER): Payer: Medicare Other | Admitting: Family Medicine

## 2020-08-18 ENCOUNTER — Encounter: Payer: Self-pay | Admitting: Family Medicine

## 2020-08-18 VITALS — BP 124/67 | HR 58 | Temp 97.7°F | Resp 20 | Ht 63.0 in | Wt 117.0 lb

## 2020-08-18 DIAGNOSIS — M792 Neuralgia and neuritis, unspecified: Secondary | ICD-10-CM

## 2020-08-18 DIAGNOSIS — G8929 Other chronic pain: Secondary | ICD-10-CM

## 2020-08-18 DIAGNOSIS — F339 Major depressive disorder, recurrent, unspecified: Secondary | ICD-10-CM | POA: Diagnosis not present

## 2020-08-18 DIAGNOSIS — Z23 Encounter for immunization: Secondary | ICD-10-CM

## 2020-08-18 DIAGNOSIS — R109 Unspecified abdominal pain: Secondary | ICD-10-CM | POA: Diagnosis not present

## 2020-08-18 DIAGNOSIS — K580 Irritable bowel syndrome with diarrhea: Secondary | ICD-10-CM

## 2020-08-18 MED ORDER — DULOXETINE HCL 30 MG PO CPEP: 30.0000 mg | ORAL_CAPSULE | Freq: Every day | ORAL | 0 refills | Status: DC

## 2020-08-18 MED ORDER — ALPHA-LIPOIC ACID 600 MG PO TABS: 600.0000 mg | ORAL_TABLET | Freq: Every day | ORAL | 3 refills | Status: DC

## 2020-08-18 NOTE — Progress Notes (Signed)
Subjective: CC: Follow-up depression, anxiety PCP: Renee Norlander, DO [id number] [name] is a 87 y.o. female presenting to clinic today for:  1.  Depression with anxiety; neuropathy; insomnia; chronic abdominal pain Patient was previously treated with Paxil but she was weaned off of this while she was hospitalized.  She and her family member here today to inquire as to what we can do going forward for her.  [name] says that she "could probably live without medication".  However, her family member who hurt his about her PHQ and GAD-7 score today indicating otherwise.  She admits that she does suffer from chronic abdominal pain and this has been worked up on numerous occasions and she has not had much relief.  She subsequently lost weight because she just simply does not have a taste for anything.  She is try to drink 2 ensures per day.  These are being supplied by the [facility].  She recently increased her melatonin to 6 mg last week in efforts to improve sleep.  She continues to suffer from neuropathic pain in the lower extremities thought left greater than right.  She describes this as a burning, pins and needle sensation.  B12 levels have been normal/above normal   ROS: Per HPI  Allergies  Allergen Reactions   Sotalol Other (See Comments)    Torsades    Tramadol Other (See Comments)    Hallucination, bad-dream   Actos [Pioglitazone] Swelling   Adhesive [Tape] Other (See Comments)    Causes sores   Atorvastatin Other (See Comments)    myalgia   Bactrim [Sulfamethoxazole-Trimethoprim] Itching and Rash    Itchy rash   Ciprofloxacin Nausea Only   Contrast Media [Iodinated Diagnostic Agents] Other (See Comments)    Headache (no action/pre-med required)   Latex Rash   Pedi-Pre Tape Spray [Wound Dressing Adhesive] Other (See Comments)    Causes sores   Sulfa Antibiotics Other (See Comments)    Causes sores   Past Medical History:  Diagnosis Date   AICD (automatic  cardioverter/defibrillator) present    Allergy    SESONAL   Aneurysm of infrarenal abdominal aorta (HCC)    Anxiety    ARTHRITIS    Arthritis    Atrial fibrillation (HCC)    CAD (coronary artery disease)    a. history of cardiac arrest 1993;  b. s/p LAD/LCX stenting in 2003;  c. 07/2011 Cath: LM nl, LAD patent stent, D1 80ost, LCX patent stent, RCA min irregs;  d. 04/2012 MV: EF 66%, no ishcemia;  e. 12/2013 Echo: Ef 55%, no rwma, Gr 1 DD, triv AI/MR, mildly dil LA;  f. 12/2013 Lexi MV: intermediate risk - apical ischemia and inf/infsept fixed defect, ? artifactual.   CAD in native artery 12/31/2013   Previous stents to LAD and Ramus patent on cath today 12/31/13 also There is severe disease in the ostial first diagonal which is unchanged from most recent cardiac catheterization. The right coronary artery could not be engaged selectively but nonselective angiography showed no significant disease in the proximal and midsegment.       Cataract    DENIES   Chronic back pain    Chronic sinus bradycardia    CKD (chronic kidney disease), stage III (HCC)    Clotting disorder ([place])    DVT   COLITIS 12/02/2007   Qualifier: Diagnosis of  By: [name] CMA (AAMA), [name]     Diabetes mellitus without complication ([place])    DENIES   Diverticulosis of  colon (without mention of hemorrhage) 2007   Colonoscopy    Esophageal stricture    a. 2012 s/p dil.   Esophagitis, unspecified    a. 2012 EGD   EXTERNAL HEMORRHOIDS    GERD (gastroesophageal reflux disease)    omeprazole   H/O hiatal hernia    Hiatal hernia    a. 2012 EGD.   History of DVT in the past, not on Coumadin now    left  leg   HYPERCHOLESTEROLEMIA    Hypertension    ICD (implantable cardiac defibrillator) in place    a. s/p initial ICD in 1993 in setting of cardiac arrest;  b. 01/2007 gen change: Guidant T135 Vitality DS VR single lead ICD.   Macular degeneration    gets injecton in eye every 5 weeks- last injection - 05/03/2013     Myocardial infarction [facility]) 1993   Near syncope 05/22/2015   Nephrolithiasis, just saw Dr [name]- "OK"    Orthostatic hypotension    Peripheral vascular disease ([place])    ???   RECTAL BLEEDING 12/03/2007   Qualifier: Diagnosis of  By: Renee Iles MD [name]    Unspecified gastritis and gastroduodenitis without mention of hemorrhage    a. 2003 EGD->not noted on 2012 EGD.   Unstable angina, neg MI, cath stable.maybe GI 12/30/2013    Current Outpatient Medications:    acetaminophen (TYLENOL) 500 MG tablet, Take 1 tablet (500 mg total) by mouth every 6 (six) hours as needed for mild pain, fever or headache., Disp: 30 tablet, Rfl: 0   apixaban (ELIQUIS) 2.5 MG TABS tablet, Take 1 tablet (2.5 mg total) by mouth 2 (two) times daily., Disp: 60 tablet, Rfl: 5   carboxymethylcellulose (REFRESH PLUS) 0.5 % SOLN, Place 1 drop into both eyes 3 (three) times daily as needed (dry/irritated eyes.)., Disp: , Rfl:    Cholecalciferol (VITAMIN D) 50 MCG (2000 UT) CAPS, Take 2,000 Units by mouth every morning., Disp: , Rfl:    feeding supplement (ENSURE ENLIVE / ENSURE PLUS) LIQD, Take 237 mLs by mouth 2 (two) times daily between meals., Disp: , Rfl:    HYDROcodone-acetaminophen (NORCO/VICODIN) 5-325 MG tablet, TAKE 1 TABLET EVERY 4 HOURS AS NEEDED FOR MODERATE PAIN, Disp: 30 tablet, Rfl: 0   isosorbide mononitrate (IMDUR) 30 MG 24 hr tablet, Take 1 tablet (30 mg total) by mouth daily., Disp: 90 tablet, Rfl: 2   levothyroxine (SYNTHROID) 100 MCG tablet, Take 1 tablet (100 mcg total) by mouth daily. (Patient taking differently: Take 100 mcg by mouth every morning.), Disp: 30 tablet, Rfl: 5   loratadine (CLARITIN) 10 MG tablet, Take 1 tablet (10 mg total) by mouth daily as needed for allergies, rhinitis or itching. (for itching), Disp: 30 tablet, Rfl: 11   MELATONIN PO, Take 3 mg by mouth at bedtime., Disp: , Rfl:    metoprolol tartrate (LOPRESSOR) 25 MG tablet, Take 0.5 tablets (12.5 mg total) by mouth 2 (two)  times daily. HOLD FOR SBP<95 OR HR<65 (Patient not taking: Reported on 07/21/2020), Disp: , Rfl:    Multiple Vitamins-Minerals (CENTRUM SILVER ULTRA WOMENS PO), Take 1 tablet by mouth every morning., Disp: , Rfl:    nitroGLYCERIN (NITROSTAT) 0.4 MG SL tablet, Place 1 tablet (0.4 mg total) under the tongue every 5 (five) minutes as needed for chest pain., Disp: 25 tablet, Rfl: 2   rosuvastatin (CRESTOR) 10 MG tablet, Take 1 tablet (10 mg total) by mouth daily. (Patient taking differently: Take 10 mg by mouth at bedtime.), Disp: 90  tablet, Rfl: 1   SSD 1 % cream, Apply topically 2 (two) times daily. (Patient not taking: Reported on 07/21/2020), Disp: , Rfl:  Social History   Socioeconomic History   Marital status: Widowed    Spouse name: Not on file   Number of children: 4   Years of education: 8   Highest education level: 8th grade  Occupational History   Occupation: Retired  Tobacco Use   Smoking status: Never   Smokeless tobacco: Never  Vaping Use   Vaping Use: Never used  Substance and Sexual Activity   Alcohol use: No   Drug use: No   Sexual activity: Never    Birth control/protection: Post-menopausal  Other Topics Concern   Not on file  Social History Narrative   Not on file   Social Determinants of Health   Financial Resource Strain: Not on file  Food Insecurity: Not on file  Transportation Needs: Not on file  Physical Activity: Not on file  Stress: Not on file  Social Connections: Not on file  Intimate Partner Violence: Not on file   Family History  Problem Relation Age of Onset   Stroke Mother    Other Mother        brain tumor   Other Father        MI   Heart attack Father    Stroke Sister    Macular degeneration Sister    Diabetes Daughter    Cancer Daughter        ovarian   Colon polyps Daughter    Atrial fibrillation Sister    Hyperlipidemia Sister    Osteoporosis Sister    Stroke Sister    Uterine cancer Sister    Heart attack Brother    Heart  disease Brother    Asthma Brother    Breast cancer Neg Hx     Objective: Office vital signs reviewed. BP 124/67   Pulse (!) 58   Temp 97.7 F (36.5 C)   Resp 20   Ht '5\' 3"'$  (1.6 m)   Wt 117 lb (53.1 kg)   SpO2 96%   BMI 20.73 kg/m   Physical Examination:  General: Awake, alert, chronically ill-appearing elderly female, No acute distress HEENT: Normal; sclera white Cardio: Regular rate and rhythm today Pulm: clear to auscultation bilaterally, no wheezes, rhonchi or rales; normal work of breathing on room air Neuro: Vibratory sensation intact  Psych: mood stable, speech normal. Depression screen [facility] 2/9 08/18/2020 06/29/2020 05/03/2020  Decreased Interest 1 2 0  Down, Depressed, Hopeless 1 2 0  PHQ - 2 Score 2 4 0  Altered sleeping 3 2 -  Tired, decreased energy 3 3 -  Change in appetite 3 2 -  Feeling bad or failure about yourself  3 1 -  Trouble concentrating 3 1 -  Moving slowly or fidgety/restless 3 0 -  Suicidal thoughts 1 0 -  PHQ-9 Score 21 13 -  Difficult doing work/chores Somewhat difficult Somewhat difficult -  Some recent data might be hidden    GAD 7 : Generalized Anxiety Score 08/18/2020 06/29/2020 05/03/2020 12/22/2019  Nervous, Anxious, on Edge 3 0 0 0  Control/stop worrying 0 2 0 0  Worry too much - different things 0 2 0 0  Trouble relaxing 2 0 0 0  Restless 0 0 0 0  Easily annoyed or irritable 2 0 0 0  Afraid - awful might happen 0 0 0 0  Total GAD 7 Score 7 4 0  0  Anxiety Difficulty Somewhat difficult Somewhat difficult Not difficult at all Not difficult at all   Assessment/ Plan: 87 y.o. female   Peripheral neuralgia - Plan: Alpha-Lipoic Acid 600 MG TABS, DULoxetine (CYMBALTA) 30 MG capsule  Chronic abdominal pain - Plan: DULoxetine (CYMBALTA) 30 MG capsule  Irritable bowel syndrome with diarrhea  Depression, recurrent (HCC) - Plan: DULoxetine (CYMBALTA) 30 MG capsule  Trial of alpha lipoic acid 600 mg daily.  I am also placing her on low-dose  Cymbalta in efforts to improve depression, musculoskeletal pain.  Hopefully this will improve some of the neuralgia she is experiencing as well.  We discussed that Paxil and TCA medications have the potential for exacerbating arrhythmias and other poor outcomes in the elderly.  Would favor a trial of an SNRI which has much less risk for these side effects.  Would like to follow-up with her again in 3 months, sooner if needed  No orders of the defined types were placed in this encounter.  No orders of the defined types were placed in this encounter.    Renee Norlander, DO [place] [phone]

## 2020-08-18 NOTE — Patient Instructions (Addendum)
B12 was checked in June and was above normal. Not a B12 deficiency causing her symptoms  Cymbalta for mood and pain

## 2020-08-19 ENCOUNTER — Telehealth: Payer: Self-pay

## 2020-08-19 NOTE — Chronic Care Management (AMB) (Signed)
  Chronic Care Management   Note  08/19/2020 Name: [name] MRN: [id number] DOB: January 30, 1931  [name] is a 87 y.o. year old female who is a primary care patient of [name], DO. I reached out to Etheleen Nicks by phone today in response to a referral sent by Ms. Addison Bailey PCP, [name], DO      Ms. Vanalstine was given information about Chronic Care Management services today including:  CCM service includes personalized support from designated clinical staff supervised by her physician, including individualized plan of care and coordination with other care providers 24/7 contact phone numbers for assistance for urgent and routine care needs. Service will only be billed when office clinical staff spend 20 minutes or more in a month to coordinate care. Only one practitioner may furnish and bill the service in a calendar month. The patient may stop CCM services at any time (effective at the end of the month) by phone call to the office staff. The patient will be responsible for cost sharing (co-pay) of up to 20% of the service fee (after annual deductible is met).  Patient agreed to services and verbal consent obtained.   Follow up plan: Telephone appointment with care management team member scheduled for:08/23/2020  Noreene Larsson, [place], [place], [place] [address] Direct Dial: [phone] [email or web address]: [email or web address]

## 2020-08-20 DIAGNOSIS — E11621 Type 2 diabetes mellitus with foot ulcer: Secondary | ICD-10-CM | POA: Diagnosis not present

## 2020-08-20 DIAGNOSIS — I25119 Atherosclerotic heart disease of native coronary artery with unspecified angina pectoris: Secondary | ICD-10-CM | POA: Diagnosis not present

## 2020-08-20 DIAGNOSIS — L03116 Cellulitis of left lower limb: Secondary | ICD-10-CM | POA: Diagnosis not present

## 2020-08-20 DIAGNOSIS — L97528 Non-pressure chronic ulcer of other part of left foot with other specified severity: Secondary | ICD-10-CM | POA: Diagnosis not present

## 2020-08-20 DIAGNOSIS — I4891 Unspecified atrial fibrillation: Secondary | ICD-10-CM | POA: Diagnosis not present

## 2020-08-20 DIAGNOSIS — Z95810 Presence of automatic (implantable) cardiac defibrillator: Secondary | ICD-10-CM | POA: Diagnosis not present

## 2020-08-23 ENCOUNTER — Other Ambulatory Visit: Payer: Self-pay | Admitting: Cardiovascular Disease

## 2020-08-23 ENCOUNTER — Ambulatory Visit (INDEPENDENT_AMBULATORY_CARE_PROVIDER_SITE_OTHER): Payer: Medicare Other | Admitting: Licensed Clinical Social Worker

## 2020-08-23 DIAGNOSIS — R1319 Other dysphagia: Secondary | ICD-10-CM

## 2020-08-23 DIAGNOSIS — I2510 Atherosclerotic heart disease of native coronary artery without angina pectoris: Secondary | ICD-10-CM | POA: Diagnosis not present

## 2020-08-23 DIAGNOSIS — F339 Major depressive disorder, recurrent, unspecified: Secondary | ICD-10-CM | POA: Diagnosis not present

## 2020-08-23 DIAGNOSIS — E119 Type 2 diabetes mellitus without complications: Secondary | ICD-10-CM | POA: Diagnosis not present

## 2020-08-23 DIAGNOSIS — E785 Hyperlipidemia, unspecified: Secondary | ICD-10-CM | POA: Diagnosis not present

## 2020-08-23 DIAGNOSIS — R001 Bradycardia, unspecified: Secondary | ICD-10-CM

## 2020-08-23 DIAGNOSIS — N1831 Chronic kidney disease, stage 3a: Secondary | ICD-10-CM | POA: Diagnosis not present

## 2020-08-23 DIAGNOSIS — I10 Essential (primary) hypertension: Secondary | ICD-10-CM

## 2020-08-23 DIAGNOSIS — E1169 Type 2 diabetes mellitus with other specified complication: Secondary | ICD-10-CM | POA: Diagnosis not present

## 2020-08-23 DIAGNOSIS — K580 Irritable bowel syndrome with diarrhea: Secondary | ICD-10-CM

## 2020-08-23 DIAGNOSIS — Z95810 Presence of automatic (implantable) cardiac defibrillator: Secondary | ICD-10-CM

## 2020-08-23 NOTE — Chronic Care Management (AMB) (Signed)
Chronic Care Management    Clinical Social Work Note  08/23/2020 Name: [name] MRN: [id number] DOB: [birth year]  [name] is a 87 y.o. year old female who is a primary care patient of [name], DO. The CCM team was consulted to assist the patient with chronic disease management and/or care coordination needs related to: [employer] .   Engaged with patient by telephone for initial visit in response to provider referral for social work chronic care management and care coordination services.   Consent to Services:  The patient was given the following information about Chronic Care Management services today, agreed to services, and gave verbal consent: 1. CCM service includes personalized support from designated clinical staff supervised by the primary care provider, including individualized plan of care and coordination with other care providers 2. 24/7 contact phone numbers for assistance for urgent and routine care needs. 3. Service will only be billed when office clinical staff spend 20 minutes or more in a month to coordinate care. 4. Only one practitioner may furnish and bill the service in a calendar month. 5.The patient may stop CCM services at any time (effective at the end of the month) by phone call to the office staff. 6. The patient will be responsible for cost sharing (co-pay) of up to 20% of the service fee (after annual deductible is met). Patient agreed to services and consent obtained.  Patient agreed to services and consent obtained.   Assessment: Review of patient past medical history, allergies, medications, and health status, including review of relevant consultants reports was performed today as part of a comprehensive evaluation and provision of chronic care management and care coordination services.     SDOH (Social Determinants of Health) assessments and interventions performed: Completed GAD-7 and PHQ-9. Client spoke of anxiety issues related  to several of her family members who are currently ill.  She is taking medications as prescribed. Has family support. Provided counseling support to client SDOH Interventions    Flowsheet Row Most Recent Value  SDOH Interventions   Depression Interventions/Treatment  Currently on Treatment        Advanced Directives Status: See Vyn[employer] application for related entries.  CCM Care Plan  Allergies  Allergen Reactions   Sotalol Other (See Comments)    Torsades    Tramadol Other (See Comments)    Hallucination, bad-dream   Actos [Pioglitazone] Swelling   Adhesive [Tape] Other (See Comments)    Causes sores   Atorvastatin Other (See Comments)    myalgia   Bactrim [Sulfamethoxazole-Trimethoprim] Itching and Rash    Itchy rash   Ciprofloxacin Nausea Only   Contrast Media [Iodinated Diagnostic Agents] Other (See Comments)    Headache (no action/pre-med required)   Latex Rash   Pedi-Pre Tape Spray [Wound Dressing Adhesive] Other (See Comments)    Causes sores   Sulfa Antibiotics Other (See Comments)    Causes sores    Outpatient Encounter Medications as of 08/23/2020  Medication Sig   acetaminophen (TYLENOL) 500 MG tablet Take 1 tablet (500 mg total) by mouth every 6 (six) hours as needed for mild pain, fever or headache.   Alpha-Lipoic Acid 600 MG TABS Take 600 mg by mouth daily. For neuropathy in legs   carboxymethylcellulose (REFRESH PLUS) 0.5 % SOLN Place 1 drop into both eyes 3 (three) times daily as needed (dry/irritated eyes.).   Cholecalciferol (VITAMIN D) 50 MCG (2000 UT) CAPS Take 2,000 Units by mouth every morning.   DULoxetine (CYMBALTA) 30  MG capsule Take 1 capsule (30 mg total) by mouth daily. TO REPLACE PAXIL for mood/ pain   ELIQUIS 2.5 MG TABS tablet TAKE  (1)  TABLET TWICE A DAY.   feeding supplement (ENSURE ENLIVE / ENSURE PLUS) LIQD Take 237 mLs by mouth 2 (two) times daily between meals.   isosorbide mononitrate (IMDUR) 30 MG 24 hr tablet Take 1 tablet (30 mg  total) by mouth daily.   levothyroxine (SYNTHROID) 100 MCG tablet Take 1 tablet (100 mcg total) by mouth daily. (Patient taking differently: Take 100 mcg by mouth every morning.)   MELATONIN PO Take 3 mg by mouth at bedtime.   metoprolol tartrate (LOPRESSOR) 25 MG tablet Take 0.5 tablets (12.5 mg total) by mouth 2 (two) times daily. HOLD FOR SBP<95 OR HR<65   Multiple Vitamins-Minerals (CENTRUM SILVER ULTRA WOMENS PO) Take 1 tablet by mouth every morning.   nitroGLYCERIN (NITROSTAT) 0.4 MG SL tablet Place 1 tablet (0.4 mg total) under the tongue every 5 (five) minutes as needed for chest pain.   rosuvastatin (CRESTOR) 10 MG tablet Take 1 tablet (10 mg total) by mouth daily. (Patient taking differently: Take 10 mg by mouth at bedtime.)   No facility-administered encounter medications on file as of 08/23/2020.    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   Intractable abdominal pain 03/28/2020   Lactic acidosis 03/28/2020   Elevated troponin I level 03/28/2020   Malnutrition of moderate degree 12/26/2019   Prolonged QT interval 12/23/2019   Acute lower UTI 12/23/2019   Constipation 11/27/2019   Diarrhea 11/27/2019   Lesion of pancreas 11/26/2019   Fibromuscular dysplasia of right renal artery ([place]) 11/26/2019   Left renal artery stenosis (HCC) 11/26/2019   RUQ pain    Abnormal CT of the abdomen    Vertigo 11/15/2019   Advanced care planning/counseling discussion 11/15/2019   CAD (coronary artery disease)    Hypothyroidism    Hyponatremia    Volume depletion    TSH elevation 08/01/2019   Hospital discharge follow-up 08/01/2019   Exudative age-related macular degeneration of right eye with inactive choroidal neovascularization ([place]) 04/30/2019   Retinal hemorrhage of right eye 04/30/2019   Advanced nonexudative  age-related macular degeneration of left eye with subfoveal involvement 04/30/2019   Exudative age-related macular degeneration of left eye with inactive choroidal neovascularization ([place]) 04/30/2019   Advanced nonexudative age-related macular degeneration of right eye with subfoveal involvement 04/30/2019   Posterior vitreous detachment of both eyes 04/30/2019   Vitreous degeneration, bilateral 04/30/2019   Secondary hypercoagulable state ([place]) 03/11/2019   Persistent atrial fibrillation (HCC)    Proctitis 01/09/2019   Atrial fibrillation, chronic ([place]) 01/09/2019   Hyperlipidemia 01/09/2019   Nausea 01/09/2019   Hyperglycemia 01/09/2019   COVID-19 virus infection 12/28/2018   Chronic diarrhea 09/18/2018   Abdominal pain 09/18/2018   Osteoarthritis of right knee 03/29/2018   GERD (gastroesophageal reflux disease)    Dehydration 02/06/2018   Hypokalemia 02/06/2018   Diarrhea of presumed infectious origin 02/06/2018   Acute kidney injury superimposed on CKD ([place]) 02/05/2018   Essential hypertension 12/21/2017   Degenerative scoliosis 02/19/2017   Burst fracture of lumbar vertebra ([place]) 02/19/2017   History of drug-induced prolonged QT interval with torsade de pointes 11/01/2016   Iron deficiency anemia 10/30/2016   Long term current use of anticoagulant 02/17/2016  Near syncope 05/22/2015   Diabetes mellitus type 2, diet-controlled ([place]) 09/14/2014   ICD (implantable cardioverter-defibrillator) battery depletion 01/20/2014   Abnormal nuclear stress test, 12/30/13 12/31/2013   Coronary artery disease involving native coronary artery of native heart without angina pectoris 12/31/2013   Paroxysmal atrial fibrillation, chads2 Vasc2 score of 5, on eliquis 10/16/2013   Catecholaminergic polymorphic ventricular tachycardia ([place]) 10/16/2013   Ureteral stone with hydronephrosis 68/61/6837   Metabolic syndrome 29/02/1113   Orthostatic hypotension 07/27/2011   Chest pain 07/25/2011    Weakness 07/25/2011    Class: Acute   Chronic sinus bradycardia 07/25/2011   ICD (implantable cardioverter-defibrillator) in place 07/25/2011   CKD (chronic kidney disease), stage III ([place]) 07/25/2011    Class: Chronic   History of DVT in the past, on eliquis now 07/25/2011    Class: History of   Nephrolithiasis, just saw Dr [name] 07/25/2011    Class: History of   DYSPHAGIA 03/24/2010   Chronic ischemic heart disease 12/03/2007   Irritable bowel syndrome with diarrhea 12/03/2007   EPIGASTRIC PAIN 12/03/2007   Hyperlipidemia associated with type 2 diabetes mellitus ([place]) 12/02/2007    Class: History of   EXTERNAL HEMORRHOIDS 12/02/2007    Class: History of   ESOPHAGITIS 12/02/2007   GASTRITIS 12/02/2007   DIVERTICULOSIS, COLON 12/02/2007   ARTHRITIS 12/02/2007    Class: Chronic    Conditions to be addressed/monitored: monitor client depression issues; monitor client anxiety issues   Care Plan : LCSW care plan  Updates made by Katha Cabal, LCSW since 08/23/2020 12:00 AM     Problem: Emotional Distress      Goal: Emotional Health Supported; Manage depression symptoms and manage anxiety symptoms   Start Date: 08/23/2020  Expected End Date: 11/23/2020  This Visit's Progress: On track  Priority: Medium  Note:   Current Barriers:  Chronic Mental Health needs related to depression and anxiety Mobility issues (uses a walker to help her walk) Suicidal Ideation/Homicidal Ideation: No Pain issue in right knee Vision needs   Clinical Social Work Goal(s):  patient will work with SW monthly by telephone or in person to reduce or manage symptoms related to depression and anxiety Patient will work with SW monthly by telephone or in person to discuss mobiltiy of client and pain issues of client  Interventions: Patient interviewed and appropriate assessments performed: GAD-7; PHQ-9 1:1 collaboration with [name], DO regarding development and update of  comprehensive plan of care as evidenced by provider attestation and co-signature Talked with Etheleen Nicks about her needs Talked with Melena about family support (support from her 3 sons and from her daughter) Talked with Elisa about DME in home (cane, rolling walker, wheelchair, eye glasses, 3 in 1 bedside commode) Talked with Shayna about recent care for large toe on her left foot . She was hospitalized recently and discharged home . She sees Dr. Steffanie Rainwater, podiatrist, for her podiatry care at this time.  She said she takes medication prescribed for her foot and uses a boot on her left foot. Talked with Albirtha about her in home support with CAP program aide. CAP program aide helps her Monday through Friday for 8 hours each day. Talked with Pamala Hurry about mood of client. She said she sometimes is sad or depressed. She said she gets anxious occasionally. She said that her brother is ill; her sister is ill. She said her niece is under [place]. .These family health issues cause her to be anxious on occasion Talked  with client about ADLs completion of client Talked with clinet about ambulation (she uses a walker to help her walk) Talked with client about difficulty standing (she said sometimes she is dizzy when she first stands up from a sitting position) Talked with client about decreased energy of client Talked with clinet about pain issues in her right knee (she said she receives periodic injections for pain for her right knee) Talked with client about eye issues (she said she receives eye care from Dr. Zadie Rhine) Talked with client about her use of Boost or Ensure supplements. She said she tries to drink 3 supplement drinks per day. She said she has decreased appetite Talked with client about RNCM support with CCM program Provided counseling support for client  Patient Self Care Activities:  Self administers medications as prescribed Attends all scheduled provider appointments Performs  ADL's independently  Patient Coping Strengths:  Supportive Relationships Family Friends  Patient Self Care Deficits:  Mobility issues Depression and anxiety challenges Pain issues in her right knee  Patient Goals:  - spend time or talk with others at least 2 to 3 times per week - practice relaxation or meditation daily - keep a calendar with appointment dates  Follow Up Plan: LCSW to call client or family representative on 09/30/20 to assess client needs     Norva Riffle.Roxie Gueye MSW, LCSW Licensed Clinical Social Worker [facility] Care Management [phone]

## 2020-08-23 NOTE — Telephone Encounter (Signed)
Prescription refill request for Eliquis received. Indication:atrial fib Last office visit:1/22 Scr:1.25 Age: 87 Weight:53.1 kg  Prescription refilled

## 2020-08-23 NOTE — Patient Instructions (Signed)
Visit Information  PATIENT GOALS:  Goals Addressed             This Visit's Progress    Manage My Emotion: Manage anxiety issues; Manage Depression issues       Timeframe:  Short-Term Goal Priority:  Medium Progress: On Track Start Date:             08/23/20                Expected End Date:           11/23/20            Follow Up Date 09/30/20   Manage Emotions; Manage anxiety issues faced; Manage depression issues faced    Why is this important?   When you are stressed, down or upset, your body reacts too.  For example, your blood pressure may get higher; you may have a headache or stomachache.  When your emotions get the best of you, your body's ability to fight off cold and flu gets weak.  These steps will help you manage your emotions.     Patient Self Care Activities:  Self administers medications as prescribed Attends all scheduled provider appointments Performs ADL's independently  Patient Coping Strengths:  Supportive Relationships Family Friends  Patient Self Care Deficits:  Mobility issues Depression and anxiety challenges Pain issues in her right knee  Patient Goals:  - spend time or talk with others at least 2 to 3 times per week - practice relaxation or meditation daily - keep a calendar with appointment dates  Follow Up Plan: LCSW to call client or family representative on 09/30/20 to assess client needs     Norva Riffle.Salvatore Poe MSW, LCSW Licensed Clinical Social Worker [facility] Care Management [phone]

## 2020-08-24 ENCOUNTER — Other Ambulatory Visit: Payer: Self-pay

## 2020-08-24 ENCOUNTER — Ambulatory Visit (INDEPENDENT_AMBULATORY_CARE_PROVIDER_SITE_OTHER): Payer: Medicare Other

## 2020-08-24 DIAGNOSIS — L03116 Cellulitis of left lower limb: Secondary | ICD-10-CM | POA: Diagnosis not present

## 2020-08-24 DIAGNOSIS — E11621 Type 2 diabetes mellitus with foot ulcer: Secondary | ICD-10-CM | POA: Diagnosis not present

## 2020-08-24 DIAGNOSIS — L97528 Non-pressure chronic ulcer of other part of left foot with other specified severity: Secondary | ICD-10-CM | POA: Diagnosis not present

## 2020-08-24 DIAGNOSIS — I4891 Unspecified atrial fibrillation: Secondary | ICD-10-CM

## 2020-08-24 DIAGNOSIS — Z86718 Personal history of other venous thrombosis and embolism: Secondary | ICD-10-CM

## 2020-08-24 DIAGNOSIS — R2681 Unsteadiness on feet: Secondary | ICD-10-CM | POA: Diagnosis not present

## 2020-08-24 DIAGNOSIS — Z95810 Presence of automatic (implantable) cardiac defibrillator: Secondary | ICD-10-CM | POA: Diagnosis not present

## 2020-08-24 DIAGNOSIS — Z7901 Long term (current) use of anticoagulants: Secondary | ICD-10-CM

## 2020-08-24 DIAGNOSIS — I25119 Atherosclerotic heart disease of native coronary artery with unspecified angina pectoris: Secondary | ICD-10-CM | POA: Diagnosis not present

## 2020-08-26 ENCOUNTER — Other Ambulatory Visit: Payer: Self-pay | Admitting: Family Medicine

## 2020-08-26 DIAGNOSIS — L97528 Non-pressure chronic ulcer of other part of left foot with other specified severity: Secondary | ICD-10-CM | POA: Diagnosis not present

## 2020-08-26 DIAGNOSIS — I25119 Atherosclerotic heart disease of native coronary artery with unspecified angina pectoris: Secondary | ICD-10-CM | POA: Diagnosis not present

## 2020-08-26 DIAGNOSIS — Z95810 Presence of automatic (implantable) cardiac defibrillator: Secondary | ICD-10-CM | POA: Diagnosis not present

## 2020-08-26 DIAGNOSIS — L03116 Cellulitis of left lower limb: Secondary | ICD-10-CM | POA: Diagnosis not present

## 2020-08-26 DIAGNOSIS — I4891 Unspecified atrial fibrillation: Secondary | ICD-10-CM | POA: Diagnosis not present

## 2020-08-26 DIAGNOSIS — E11621 Type 2 diabetes mellitus with foot ulcer: Secondary | ICD-10-CM | POA: Diagnosis not present

## 2020-08-31 ENCOUNTER — Emergency Department (HOSPITAL_COMMUNITY): Payer: Medicare Other

## 2020-08-31 ENCOUNTER — Emergency Department (HOSPITAL_COMMUNITY)
Admission: EM | Admit: 2020-08-31 | Discharge: 2020-08-31 | Disposition: A | Discharge status: Home / Self Care | Payer: Medicare Other | Attending: Emergency Medicine | Admitting: Emergency Medicine

## 2020-08-31 ENCOUNTER — Encounter (HOSPITAL_COMMUNITY): Payer: Self-pay

## 2020-08-31 ENCOUNTER — Other Ambulatory Visit: Payer: Self-pay

## 2020-08-31 DIAGNOSIS — E039 Hypothyroidism, unspecified: Secondary | ICD-10-CM | POA: Insufficient documentation

## 2020-08-31 DIAGNOSIS — E1122 Type 2 diabetes mellitus with diabetic chronic kidney disease: Secondary | ICD-10-CM | POA: Diagnosis not present

## 2020-08-31 DIAGNOSIS — I25119 Atherosclerotic heart disease of native coronary artery with unspecified angina pectoris: Secondary | ICD-10-CM | POA: Diagnosis not present

## 2020-08-31 DIAGNOSIS — Z8616 Personal history of COVID-19: Secondary | ICD-10-CM | POA: Diagnosis not present

## 2020-08-31 DIAGNOSIS — N183 Chronic kidney disease, stage 3 unspecified: Secondary | ICD-10-CM | POA: Diagnosis not present

## 2020-08-31 DIAGNOSIS — I129 Hypertensive chronic kidney disease with stage 1 through stage 4 chronic kidney disease, or unspecified chronic kidney disease: Secondary | ICD-10-CM | POA: Insufficient documentation

## 2020-08-31 DIAGNOSIS — Z79899 Other long term (current) drug therapy: Secondary | ICD-10-CM | POA: Insufficient documentation

## 2020-08-31 DIAGNOSIS — R0789 Other chest pain: Secondary | ICD-10-CM | POA: Diagnosis not present

## 2020-08-31 DIAGNOSIS — I4891 Unspecified atrial fibrillation: Secondary | ICD-10-CM | POA: Diagnosis not present

## 2020-08-31 DIAGNOSIS — R079 Chest pain, unspecified: Secondary | ICD-10-CM | POA: Diagnosis not present

## 2020-08-31 DIAGNOSIS — I2510 Atherosclerotic heart disease of native coronary artery without angina pectoris: Secondary | ICD-10-CM | POA: Insufficient documentation

## 2020-08-31 DIAGNOSIS — Z91040 Latex allergy status: Secondary | ICD-10-CM | POA: Diagnosis not present

## 2020-08-31 DIAGNOSIS — E876 Hypokalemia: Secondary | ICD-10-CM | POA: Diagnosis not present

## 2020-08-31 DIAGNOSIS — Z7901 Long term (current) use of anticoagulants: Secondary | ICD-10-CM | POA: Diagnosis not present

## 2020-08-31 DIAGNOSIS — E11621 Type 2 diabetes mellitus with foot ulcer: Secondary | ICD-10-CM | POA: Diagnosis not present

## 2020-08-31 DIAGNOSIS — I517 Cardiomegaly: Secondary | ICD-10-CM | POA: Diagnosis not present

## 2020-08-31 DIAGNOSIS — L03116 Cellulitis of left lower limb: Secondary | ICD-10-CM | POA: Diagnosis not present

## 2020-08-31 DIAGNOSIS — L97528 Non-pressure chronic ulcer of other part of left foot with other specified severity: Secondary | ICD-10-CM | POA: Diagnosis not present

## 2020-08-31 DIAGNOSIS — Z95810 Presence of automatic (implantable) cardiac defibrillator: Secondary | ICD-10-CM | POA: Diagnosis not present

## 2020-08-31 LAB — COMPREHENSIVE METABOLIC PANEL
ALT: 17 U/L (ref 0–44)
AST: 21 U/L (ref 15–41)
Albumin: 3.4 g/dL — ABNORMAL LOW (ref 3.5–5.0)
Alkaline Phosphatase: 66 U/L (ref 38–126)
Anion gap: 6 (ref 5–15)
BUN: 25 mg/dL — ABNORMAL HIGH (ref 8–23)
CO2: 23 mmol/L (ref 22–32)
Calcium: 8.7 mg/dL — ABNORMAL LOW (ref 8.9–10.3)
Chloride: 107 mmol/L (ref 98–111)
Creatinine, Ser: 1.13 mg/dL — ABNORMAL HIGH (ref 0.44–1.00)
GFR, Estimated: 47 mL/min — ABNORMAL LOW (ref >60)
Glucose, Bld: 127 mg/dL — ABNORMAL HIGH (ref 70–99)
Potassium: 3.1 mmol/L — ABNORMAL LOW (ref 3.5–5.1)
Sodium: 136 mmol/L (ref 135–145)
Total Bilirubin: 0.8 mg/dL (ref 0.3–1.2)
Total Protein: 6.4 g/dL — ABNORMAL LOW (ref 6.5–8.1)

## 2020-08-31 LAB — CBC WITH DIFFERENTIAL/PLATELET
Abs Immature Granulocytes: 0.01 10*3/uL (ref 0.00–0.07)
Basophils Absolute: 0.1 10*3/uL (ref 0.0–0.1)
Basophils Relative: 1 %
Eosinophils Absolute: 0.3 10*3/uL (ref 0.0–0.5)
Eosinophils Relative: 4 %
HCT: 36.5 % (ref 36.0–46.0)
Hemoglobin: 11.9 g/dL — ABNORMAL LOW (ref 12.0–15.0)
Immature Granulocytes: 0 %
Lymphocytes Relative: 20 %
Lymphs Abs: 1.3 10*3/uL (ref 0.7–4.0)
MCH: 31.6 pg (ref 26.0–34.0)
MCHC: 32.6 g/dL (ref 30.0–36.0)
MCV: 96.8 fL (ref 80.0–100.0)
Monocytes Absolute: 0.7 10*3/uL (ref 0.1–1.0)
Monocytes Relative: 11 %
Neutro Abs: 4.3 10*3/uL (ref 1.7–7.7)
Neutrophils Relative %: 64 %
Platelets: 203 10*3/uL (ref 150–400)
RBC: 3.77 MIL/uL — ABNORMAL LOW (ref 3.87–5.11)
RDW: 14.7 % (ref 11.5–15.5)
WBC: 6.6 10*3/uL (ref 4.0–10.5)
nRBC: 0 % (ref 0.0–0.2)

## 2020-08-31 LAB — TROPONIN I (HIGH SENSITIVITY)
Troponin I (High Sensitivity): 10 ng/L (ref <18)
Troponin I (High Sensitivity): 13 ng/L (ref <18)

## 2020-08-31 MED ORDER — POTASSIUM CHLORIDE CRYS ER 20 MEQ PO TBCR
40.0000 meq | EXTENDED_RELEASE_TABLET | Freq: Once | ORAL | Status: AC
  Administered 2020-08-31: 40 meq via ORAL
  Filled 2020-08-31: qty 2

## 2020-08-31 MED ORDER — POTASSIUM CHLORIDE CRYS ER 20 MEQ PO TBCR: 20.0000 meq | EXTENDED_RELEASE_TABLET | Freq: Two times a day (BID) | ORAL | 0 refills | Status: DC

## 2020-08-31 NOTE — ED Notes (Signed)
Took pt to the restroom via wheelchair. And hooked pt back up to monitor.

## 2020-08-31 NOTE — ED Triage Notes (Addendum)
Pt arrives via ems, c/o chest pain x2days, worse today, 8/10, hx Afib, HTN nsr per ems, has defibrillator in place.  Denies shob, denies nausea, describes as pressure.  Pt took '325mg'$  aspirin prior to ems arrival

## 2020-08-31 NOTE — Discharge Instructions (Addendum)
Make an appointment to follow-up with your doctors.  To have your potassium rechecked.  Today it was a little low.  We gave you some oral supplementation of the potassium today and those prescription to get filled tomorrow to take another dose.  Work-up for the chest pain without any acute findings.  Return for any new or worse symptoms.  Or any new or worse chest pain.

## 2020-08-31 NOTE — ED Provider Notes (Signed)
[facility] EMERGENCY DEPARTMENT Provider Note   CSN: [id number] Arrival date & time: 08/31/20  1336     History Chief Complaint  Patient presents with   Chest Pain    [name] is a 87 y.o. female.  Patient brought in by EMS.  Patient with a complaint of chest pain for 2 days.  Worse today.  8 out of 10.  Patient has a defibrillator not a pacemaker.  Patient this afternoon stated that the chest pain improved significantly.  Now states that it is resolved.  Pain was left anterior chest.  Patient's had some diarrhea for the past few days.  But improving.  Past medical history is significant for coronary artery disease with stents.  Chronic back pain chronic kidney disease stage III, aneurysm of infrarenal abdominal aorta.  Patient without any abdominal pain.  Patient also with a history of hypertension high cholesterol.      Past Medical History:  Diagnosis Date   AICD (automatic cardioverter/defibrillator) present    Allergy    SESONAL   Aneurysm of infrarenal abdominal aorta (HCC)    Anxiety    ARTHRITIS    Arthritis    Atrial fibrillation (HCC)    CAD (coronary artery disease)    a. history of cardiac arrest 1993;  b. s/p LAD/LCX stenting in 2003;  c. 07/2011 Cath: LM nl, LAD patent stent, D1 80ost, LCX patent stent, RCA min irregs;  d. 04/2012 MV: EF 66%, no ishcemia;  e. 12/2013 Echo: Ef 55%, no rwma, Gr 1 DD, triv AI/MR, mildly dil LA;  f. 12/2013 Lexi MV: intermediate risk - apical ischemia and inf/infsept fixed defect, ? artifactual.   CAD in native artery 12/31/2013   Previous stents to LAD and Ramus patent on cath today 12/31/13 also There is severe disease in the ostial first diagonal which is unchanged from most recent cardiac catheterization. The right coronary artery could not be engaged selectively but nonselective angiography showed no significant disease in the proximal and midsegment.       Cataract    DENIES   Chronic back pain    Chronic sinus bradycardia     CKD (chronic kidney disease), stage III (HCC)    Clotting disorder ([place])    DVT   COLITIS 12/02/2007   Qualifier: Diagnosis of  By: [name], [name]     Diabetes mellitus without complication ([place])    DENIES   Diverticulosis of colon (without mention of hemorrhage) 2007   Colonoscopy    Esophageal stricture    a. 2012 s/p dil.   Esophagitis, unspecified    a. 2012 EGD   EXTERNAL HEMORRHOIDS    GERD (gastroesophageal reflux disease)    omeprazole   H/O hiatal hernia    Hiatal hernia    a. 2012 EGD.   History of DVT in the past, not on Coumadin now    left  leg   HYPERCHOLESTEROLEMIA    Hypertension    ICD (implantable cardiac defibrillator) in place    a. s/p initial ICD in 1993 in setting of cardiac arrest;  b. 01/2007 gen change: Guidant T135 Vitality DS VR single lead ICD.   Macular degeneration    gets injecton in eye every 5 weeks- last injection - 05/03/2013    Myocardial infarction [facility]) 1993   Near syncope 05/22/2015   Nephrolithiasis, just saw Dr [name]    Orthostatic hypotension    Peripheral vascular disease ([place])    ???   RECTAL  BLEEDING 12/03/2007   Qualifier: Diagnosis of  By: [name]    Unspecified gastritis and gastroduodenitis without mention of hemorrhage    a. 2003 EGD->not noted on 2012 EGD.   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[place]) 05/12/2020   Intractable abdominal pain 03/28/2020   Lactic acidosis 03/28/2020   Elevated troponin I level 03/28/2020   Malnutrition of moderate degree 12/26/2019   Prolonged QT interval 12/23/2019   Acute lower UTI 12/23/2019   Constipation 11/27/2019   Diarrhea 11/27/2019   Lesion of pancreas 11/26/2019   Fibromuscular dysplasia of right renal artery ([place])  11/26/2019   Left renal artery stenosis (HCC) 11/26/2019   RUQ pain    Abnormal CT of the abdomen    Vertigo 11/15/2019   Advanced care planning/counseling discussion 11/15/2019   CAD (coronary artery disease)    Hypothyroidism    Hyponatremia    Volume depletion    TSH elevation 08/01/2019   Hospital discharge follow-up 08/01/2019   Exudative age-related macular degeneration of right eye with inactive choroidal neovascularization ([place]) 04/30/2019   Retinal hemorrhage of right eye 04/30/2019   Advanced nonexudative age-related macular degeneration of left eye with subfoveal involvement 04/30/2019   Exudative age-related macular degeneration of left eye with inactive choroidal neovascularization ([place]) 04/30/2019   Advanced nonexudative age-related macular degeneration of right eye with subfoveal involvement 04/30/2019   Posterior vitreous detachment of both eyes 04/30/2019   Vitreous degeneration, bilateral 04/30/2019   Secondary hypercoagulable state ([place]) 03/11/2019   Persistent atrial fibrillation (HCC)    Proctitis 01/09/2019   Atrial fibrillation, chronic ([place]) 01/09/2019   Hyperlipidemia 01/09/2019   Nausea 01/09/2019   Hyperglycemia 01/09/2019   COVID-19 virus infection 12/28/2018   Chronic diarrhea 09/18/2018   Abdominal pain 09/18/2018   Osteoarthritis of right knee 03/29/2018   GERD (gastroesophageal reflux disease)    Dehydration 02/06/2018   Hypokalemia 02/06/2018   Diarrhea of presumed infectious origin 02/06/2018   Acute kidney injury superimposed on CKD ([place]) 02/05/2018   Essential hypertension 12/21/2017   Degenerative scoliosis 02/19/2017   Burst fracture of lumbar vertebra ([place]) 02/19/2017   History of drug-induced prolonged QT interval with torsade de pointes 11/01/2016   Iron deficiency anemia 10/30/2016   Long term current use of anticoagulant 02/17/2016   Near syncope 05/22/2015   Diabetes mellitus type 2, diet-controlled ([place]) 09/14/2014   ICD  (implantable cardioverter-defibrillator) battery depletion 01/20/2014   Abnormal nuclear stress test, 12/30/13 12/31/2013   Coronary artery disease involving native coronary artery of native heart without angina pectoris 12/31/2013   Paroxysmal atrial fibrillation, chads2 Vasc2 score of 5, on eliquis 10/16/2013   Catecholaminergic polymorphic ventricular tachycardia ([place]) 10/16/2013   Ureteral stone with hydronephrosis [id number]   Metabolic syndrome [id number]   Orthostatic hypotension 07/27/2011   Chest pain 07/25/2011   Weakness 07/25/2011    Class: Acute   Chronic sinus bradycardia 07/25/2011   ICD (implantable cardioverter-defibrillator) in place 07/25/2011   CKD (chronic kidney disease), stage III ([place]) 07/25/2011    Class: Chronic   History of DVT in the past, on eliquis now 07/25/2011    Class: History of   Nephrolithiasis, just saw Dr [name] 07/25/2011    Class: History of   DYSPHAGIA 03/24/2010   Chronic ischemic  heart disease 12/03/2007   Irritable bowel syndrome with diarrhea 12/03/2007   EPIGASTRIC PAIN 12/03/2007   Hyperlipidemia associated with type 2 diabetes mellitus ([place]) 12/02/2007    Class: History of   EXTERNAL HEMORRHOIDS 12/02/2007    Class: History of   ESOPHAGITIS 12/02/2007   GASTRITIS 12/02/2007   DIVERTICULOSIS, COLON 12/02/2007   ARTHRITIS 12/02/2007    Class: Chronic    Past Surgical History:  Procedure Laterality Date   BREAST BIOPSY Left 11/2013   CARDIAC CATHETERIZATION     CARDIAC CATHETERIZATION  01/01/15   patent stents -there is severe disease in ostial 1st diag but without change.    CARDIAC DEFIBRILLATOR PLACEMENT  02/05/2007   Guidant   CARDIOVASCULAR STRESS TEST  12/30/2013   abnormal   CARDIOVERSION N/A 02/13/2019   Procedure: CARDIOVERSION;  Surgeon: Sanda Klein, MD;  Location: [facility];  Service: Cardiovascular;  Laterality: N/A;   CATARACT EXTRACTION Right    Dr. Katy Fitch   COLONOSCOPY     COLONOSCOPY WITH PROPOFOL  N/A 03/30/2020   Procedure: COLONOSCOPY WITH PROPOFOL;  Surgeon: Harvel Quale, MD;  [facility];  Service: Gastroenterology;  Laterality: N/A;   coronary stents      CYSTOSCOPY WITH URETEROSCOPY Right 05/20/2013   Procedure: CYSTOSCOPY, RIGHT URETEROSCOPY STONE EXTRACTION, Insertion of right DOUBLE J STENT ;  Surgeon: Irine Seal, MD;  Location: [facility];  Service: Urology;  Laterality: Right;   CYSTOSCOPY WITH URETEROSCOPY AND STENT PLACEMENT N/A 06/03/2013   Procedure: SECOND LOOK CYSTOSCOPY WITH URETEROSCOPY  HOLMIUM LASER LITHO AND STONE EXTRACTION Sammie Bench ;  Surgeon: Malka So, MD;  Location: [facility];  Service: Urology;  Laterality: N/A;   ESOPHAGOGASTRODUODENOSCOPY (EGD) WITH PROPOFOL N/A 11/20/2019   Procedure: ESOPHAGOGASTRODUODENOSCOPY (EGD) WITH PROPOFOL;  Surgeon: Daneil Dolin, MD;  [facility];  Service: Endoscopy;  Laterality: N/A;   GIVENS CAPSULE STUDY N/A 10/30/2016   Procedure: GIVENS CAPSULE STUDY;  Surgeon: Irene Shipper, MD;  Location: [facility] ENDOSCOPY;  Service: Endoscopy;  Laterality: N/A;  NEEDS TO BE ADMITTED FOR OBSERVATION PT. HAS DEFIB   HEMORROIDECTOMY  80's   HOLMIUM LASER APPLICATION Right [id number]   Procedure: HOLMIUM LASER APPLICATION;  Surgeon: Irine Seal, MD;  Location: [facility];  Service: Urology;  Laterality: Right;   HYSTEROSCOPY WITH D & C  09/02/2010   Procedure: DILATATION AND CURETTAGE (D&C) /HYSTEROSCOPY;  Surgeon: Margarette Asal;  Location: [facility];  Service: Gynecology;  Laterality: N/A;  Dilation and Curettage with Hysteroscopy and Polypectomy   IMPLANTABLE CARDIOVERTER DEFIBRILLATOR (ICD) GENERATOR CHANGE N/A 02/10/2014   Procedure: ICD GENERATOR CHANGE;  Surgeon: Sanda Klein, MD;  Location: [place] CATH LAB;  Service: Cardiovascular;  Laterality: N/A;   LEFT HEART CATHETERIZATION WITH CORONARY ANGIOGRAM N/A 07/28/2011   Procedure: LEFT HEART CATHETERIZATION WITH CORONARY ANGIOGRAM;  Surgeon: Lorretta Harp, MD;  Location:  [facility] CATH LAB;  Service: Cardiovascular;  Laterality: N/A;   LEFT HEART CATHETERIZATION WITH CORONARY ANGIOGRAM N/A 12/31/2013   Procedure: LEFT HEART CATHETERIZATION WITH CORONARY ANGIOGRAM;  Surgeon: Wellington Hampshire, MD;  Location: [facility] CATH LAB;  Service: Cardiovascular;  Laterality: N/A;     OB History   No obstetric history on file.     Family History  Problem Relation Age of Onset   Stroke Mother    Other Mother        brain tumor   Other Father        MI   Heart attack Father  Stroke Sister    Macular degeneration Sister    Diabetes Daughter    Cancer Daughter        ovarian   Colon polyps Daughter    Atrial fibrillation Sister    Hyperlipidemia Sister    Osteoporosis Sister    Stroke Sister    Uterine cancer Sister    Heart attack Brother    Heart disease Brother    Asthma Brother    Breast cancer Neg Hx     Social History   Tobacco Use   Smoking status: Never   Smokeless tobacco: Never  Vaping Use   Vaping Use: Never used  Substance Use Topics   Alcohol use: No   Drug use: No    Home Medications Prior to Admission medications   Medication Sig Start Date End Date Taking? Authorizing Provider  acetaminophen (TYLENOL) 500 MG tablet Take 1 tablet (500 mg total) by mouth every 6 (six) hours as needed for mild pain, fever or headache. 04/05/20  Yes Johnson, Clanford L, MD  Alpha-Lipoic Acid 600 MG TABS Take 600 mg by mouth daily. For neuropathy in legs 08/18/20  Yes Gottschalk, Ashly M, DO  carboxymethylcellulose (REFRESH PLUS) 0.5 % SOLN Place 1 drop into both eyes 3 (three) times daily as needed (dry/irritated eyes.).   Yes [provider]  Cholecalciferol (VITAMIN D) 50 MCG (2000 UT) CAPS Take 2,000 Units by mouth every morning.   Yes [provider]  DULoxetine (CYMBALTA) 30 MG capsule Take 1 capsule (30 mg total) by mouth daily. TO REPLACE PAXIL for mood/ pain 08/18/20  Yes Gottschalk, Ashly M, DO  ELIQUIS 2.5 MG TABS tablet TAKE  (1)  TABLET  TWICE A DAY. Patient taking differently: Take 2.5 mg by mouth 2 (two) times daily. 08/23/20  Yes Croitoru, Mihai, MD  feeding supplement (ENSURE ENLIVE / ENSURE PLUS) LIQD Take 237 mLs by mouth 2 (two) times daily between meals. 12/26/19  Yes Mikhail, Velta Addison, DO  isosorbide mononitrate (IMDUR) 30 MG 24 hr tablet Take 1 tablet (30 mg total) by mouth daily. 06/23/20  Yes Roxan Hockey, MD  levothyroxine (SYNTHROID) 100 MCG tablet Take 1 tablet (100 mcg total) by mouth daily. Patient taking differently: Take 100 mcg by mouth every morning. 01/27/20  Yes Croitoru, Mihai, MD  MELATONIN PO Take 3 mg by mouth at bedtime.   Yes [provider]  metoprolol tartrate (LOPRESSOR) 25 MG tablet Take 0.5 tablets (12.5 mg total) by mouth 2 (two) times daily. HOLD FOR SBP<95 OR HR<65 04/05/20  Yes Johnson, Clanford L, MD  Multiple Vitamins-Minerals (CENTRUM SILVER ULTRA WOMENS PO) Take 1 tablet by mouth every morning.   Yes [provider]  nitroGLYCERIN (NITROSTAT) 0.4 MG SL tablet Place 1 tablet (0.4 mg total) under the tongue every 5 (five) minutes as needed for chest pain. 12/20/18  Yes Stacks, Cletus Gash, MD  pantoprazole (PROTONIX) 40 MG tablet TAKE 1 TABLET ONCE DAILY 08/27/20  Yes Ronnie Doss M, DO  potassium chloride SA (KLOR-CON) 20 MEQ tablet Take 1 tablet (20 mEq total) by mouth 2 (two) times daily. 08/31/20  Yes Fredia Sorrow, MD  rosuvastatin (CRESTOR) 10 MG tablet TAKE 1 TABLET DAILY 08/26/20  Yes Ronnie Doss M, DO  Alpha-Lipoic Acid 600 MG CAPS Take 1 capsule by mouth daily. Patient not taking: Reported on 08/31/2020 08/18/20   [provider]    Allergies    Sotalol, Tramadol, Actos [pioglitazone], Adhesive [tape], Atorvastatin, Bactrim [sulfamethoxazole-trimethoprim], Ciprofloxacin, Contrast media [iodinated diagnostic agents], Latex, Pedi-pre  tape spray [wound dressing adhesive], and Sulfa antibiotics  Review of Systems   Review of Systems  Constitutional:   Negative for chills and fever.  HENT:  Negative for ear pain and sore throat.   Eyes:  Negative for pain and visual disturbance.  Respiratory:  Negative for cough and shortness of breath.   Cardiovascular:  Positive for chest pain. Negative for palpitations.  Gastrointestinal:  Positive for diarrhea. Negative for abdominal pain and vomiting.  Genitourinary:  Negative for dysuria and hematuria.  Musculoskeletal:  Negative for arthralgias and back pain.  Skin:  Negative for color change and rash.  Neurological:  Negative for seizures and syncope.  All other systems reviewed and are negative.  Physical Exam Updated Vital Signs BP 136/79   Pulse 80   Temp 97.8 F (36.6 C) (Oral)   Resp 18   SpO2 98%   Physical Exam Vitals and nursing note reviewed.  Constitutional:      General: She is not in acute distress.    Appearance: Normal appearance. She is well-developed.  HENT:     Head: Normocephalic and atraumatic.  Eyes:     Extraocular Movements: Extraocular movements intact.     Conjunctiva/sclera: Conjunctivae normal.     Pupils: Pupils are equal, round, and reactive to light.  Cardiovascular:     Rate and Rhythm: Normal rate and regular rhythm.     Heart sounds: No murmur heard. Pulmonary:     Effort: Pulmonary effort is normal. No respiratory distress.     Breath sounds: Normal breath sounds. No wheezing or rales.  Chest:     Chest wall: No tenderness.  Abdominal:     Palpations: Abdomen is soft. There is no mass.     Tenderness: There is no abdominal tenderness. There is no guarding.     Comments: Abdomen soft nontender no palpable mass  Musculoskeletal:        General: No swelling.     Cervical back: Normal range of motion and neck supple.     Comments: No lower extremity swelling.  Patient with a soft orthopedic shoe to the left foot.  Due to toe infection being followed by podiatry  Skin:    General: Skin is warm and dry.     Capillary Refill: Capillary refill takes  less than 2 seconds.  Neurological:     General: No focal deficit present.     Mental Status: She is alert and oriented to person, place, and time.    ED Results / Procedures / Treatments   Labs (all labs ordered are listed, but only abnormal results are displayed) Labs Reviewed  CBC WITH DIFFERENTIAL/PLATELET - Abnormal; Notable for the following components:      Result Value   RBC 3.77 (*)    Hemoglobin 11.9 (*)    All other components within normal limits  COMPREHENSIVE METABOLIC PANEL - Abnormal; Notable for the following components:   Potassium 3.1 (*)    Glucose, Bld 127 (*)    BUN 25 (*)    Creatinine, Ser 1.13 (*)    Calcium 8.7 (*)    Total Protein 6.4 (*)    Albumin 3.4 (*)    GFR, Estimated 47 (*)    All other components within normal limits  TROPONIN I (HIGH SENSITIVITY)  TROPONIN I (HIGH SENSITIVITY)    EKG EKG Interpretation  Date/Time:  Tuesday August 31 2020 13:47:01 EDT Ventricular Rate:  81 PR Interval:    QRS Duration: 132 QT Interval:  398 QTC Calculation: 462 R Axis:   -46 Text Interpretation: sinus or ectopic rhythm Left bundle branch block No significant change since last tracing Confirmed by Fredia Sorrow [id number]) on 08/31/2020 3:46:07 PM  Radiology DG Chest Port 1 View  Result Date: 08/31/2020 CLINICAL DATA:  Chest pain EXAM: PORTABLE CHEST 1 VIEW COMPARISON:  06/20/2020 FINDINGS: Left-sided multi lead pacing device. Mild cardiomegaly. No focal opacity or pleural effusion. Aortic atherosclerosis. No pneumothorax. IMPRESSION: No active disease.  Mild cardiomegaly. Electronically Signed   By: Donavan Foil M.D.   On: 08/31/2020 16:15    Procedures Procedures   Medications Ordered in ED Medications  potassium chloride SA (KLOR-CON) CR tablet 40 mEq (40 mEq Oral Given 08/31/20 1712)    ED Course  I have reviewed the triage vital signs and the nursing notes.  Pertinent labs & imaging results that were available during my care of the patient  were reviewed by me and considered in my medical decision making (see chart for details).    MDM Rules/Calculators/A&P                           Work-up for the chest pain without any significant findings.  Troponins x2 very normal.  Which is reassuring since patient has had chest pain for the past 2 days.  Patient is now chest pain-free.  Chest x-ray without acute findings.  EKG without any significant changes.  Patient has a history of abdominal aortic aneurysm but has no significant tenderness or palpable mass in the abdomen area.  Patient's potassium was a little low at 3.1.  Patient given some oral potassium here 40 mEq.  Will be continued at home with some potassium supplementation and follow-up with her doctor.   Final Clinical Impression(s) / ED Diagnoses Final diagnoses:  Atypical chest pain  Hypokalemia    Rx / DC Orders ED Discharge Orders          Ordered    potassium chloride SA (KLOR-CON) 20 MEQ tablet  2 times daily        08/31/20 2006             Fredia Sorrow, MD 08/31/20 2011

## 2020-09-01 ENCOUNTER — Telehealth: Payer: Self-pay | Admitting: Family Medicine

## 2020-09-01 NOTE — Telephone Encounter (Signed)
No answer, no voicemail.

## 2020-09-02 NOTE — Telephone Encounter (Signed)
Patient scheduled for hospital follow up on 09/07/20 with Dr. Lajuana Ripple.

## 2020-09-07 ENCOUNTER — Ambulatory Visit (INDEPENDENT_AMBULATORY_CARE_PROVIDER_SITE_OTHER): Payer: Medicare Other | Admitting: Family Medicine

## 2020-09-07 ENCOUNTER — Encounter: Payer: Self-pay | Admitting: Family Medicine

## 2020-09-07 ENCOUNTER — Other Ambulatory Visit: Payer: Self-pay

## 2020-09-07 VITALS — BP 132/70 | HR 60 | Temp 97.7°F | Ht 63.0 in | Wt 121.2 lb

## 2020-09-07 DIAGNOSIS — R195 Other fecal abnormalities: Secondary | ICD-10-CM

## 2020-09-07 DIAGNOSIS — I70203 Unspecified atherosclerosis of native arteries of extremities, bilateral legs: Secondary | ICD-10-CM | POA: Diagnosis not present

## 2020-09-07 DIAGNOSIS — E876 Hypokalemia: Secondary | ICD-10-CM

## 2020-09-07 DIAGNOSIS — R300 Dysuria: Secondary | ICD-10-CM | POA: Diagnosis not present

## 2020-09-07 DIAGNOSIS — L84 Corns and callosities: Secondary | ICD-10-CM | POA: Diagnosis not present

## 2020-09-07 DIAGNOSIS — R0989 Other specified symptoms and signs involving the circulatory and respiratory systems: Secondary | ICD-10-CM

## 2020-09-07 DIAGNOSIS — M79676 Pain in unspecified toe(s): Secondary | ICD-10-CM | POA: Diagnosis not present

## 2020-09-07 DIAGNOSIS — N3001 Acute cystitis with hematuria: Secondary | ICD-10-CM | POA: Diagnosis not present

## 2020-09-07 DIAGNOSIS — B351 Tinea unguium: Secondary | ICD-10-CM | POA: Diagnosis not present

## 2020-09-07 LAB — URINALYSIS, ROUTINE W REFLEX MICROSCOPIC
Bilirubin, UA: NEGATIVE
Glucose, UA: NEGATIVE
Ketones, UA: NEGATIVE
Nitrite, UA: NEGATIVE
Specific Gravity, UA: 1.01 (ref 1.005–1.030)
Urobilinogen, Ur: 0.2 mg/dL (ref 0.2–1.0)
pH, UA: 6 (ref 5.0–7.5)

## 2020-09-07 LAB — MICROSCOPIC EXAMINATION
Epithelial Cells (non renal): NONE SEEN /hpf (ref 0–10)
RBC, Urine: >30 /hpf — AB (ref 0–2)
WBC, UA: >30 /hpf — AB (ref 0–5)

## 2020-09-07 MED ORDER — CEPHALEXIN 500 MG PO CAPS
500.0000 mg | ORAL_CAPSULE | Freq: Two times a day (BID) | ORAL | 0 refills | Status: AC
Start: 2020-09-07 — End: 2020-09-14

## 2020-09-07 NOTE — Patient Instructions (Signed)
Remember the Metoprolol is written for 1/2 tablet TWICE daily.  Can take with your Eliquis.  You had labs performed today.  You will be contacted with the results of the labs once they are available, usually in the next 3 business days for routine lab work.  If you have an active my chart account, they will be released to your MyChart.  If you prefer to have these labs released to you via telephone, please let us know.  If you had a pap smear or biopsy performed, expect to be contacted in about 7-10 days.

## 2020-09-07 NOTE — Progress Notes (Signed)
Subjective: CC:ER follow up PCP: [name], DO [id number] [name] is a 87 y.o. female presenting to clinic today for:  1.  Hypokalemia Noted to be hypokalemic to 3.1.  She presented to the ER for atypical left-sided chest pain that have resolved upon presentation to the ER on 16th.  Her work-up was negative except for the hypokalemia as stated above.  She had had some diarrhea preceding presentation ER.  She was discharged with K-Dur 40 MEQ's to take until she was seen today in office.  She has finished potassium that was prescribed.  She does not report any heart palpitations, dizziness.  Her blood pressure has been fluctuating quite a bit.  She is accompanied today by her family member who notes that her blood pressures have been fluctuating from systolics of the 0000000 to systolics of [id number].  She thinks that perhaps this has to do with her not getting up 1 time to take her medication and perhaps some overlap with her afternoon medications.  However upon further investigation it turns out she has been taking her metoprolol as a full tablet rather than half tablet daily.  It does not sound like she is been following the parameters of holding for systolic blood pressure less than 95 or heart rate less than 65.  She continues to have intermittently stools and this is a chronic issue for her.  She reports dysuria over the last couple of days.  No reports of hematuria or fevers   ROS: Per HPI  Allergies  Allergen Reactions   Sotalol Other (See Comments)    Torsades    Tramadol Other (See Comments)    Hallucination, bad-dream   Actos [Pioglitazone] Swelling   Adhesive [Tape] Other (See Comments)    Causes sores   Atorvastatin Other (See Comments)    myalgia   Bactrim [Sulfamethoxazole-Trimethoprim] Itching and Rash    Itchy rash   Ciprofloxacin Nausea Only   Contrast Media [Iodinated Diagnostic Agents] Other (See Comments)    Headache (no action/pre-med required)   Latex Rash    Pedi-Pre Tape Spray [Wound Dressing Adhesive] Other (See Comments)    Causes sores   Sulfa Antibiotics Other (See Comments)    Causes sores   Past Medical History:  Diagnosis Date   AICD (automatic cardioverter/defibrillator) present    Allergy    SESONAL   Aneurysm of infrarenal abdominal aorta (HCC)    Anxiety    ARTHRITIS    Arthritis    Atrial fibrillation (HCC)    CAD (coronary artery disease)    a. history of cardiac arrest 1993;  b. s/p LAD/LCX stenting in 2003;  c. 07/2011 Cath: LM nl, LAD patent stent, D1 80ost, LCX patent stent, RCA min irregs;  d. 04/2012 MV: EF 66%, no ishcemia;  e. 12/2013 Echo: Ef 55%, no rwma, Gr 1 DD, triv AI/MR, mildly dil LA;  f. 12/2013 Lexi MV: intermediate risk - apical ischemia and inf/infsept fixed defect, ? artifactual.   CAD in native artery 12/31/2013   Previous stents to LAD and Ramus patent on cath today 12/31/13 also There is severe disease in the ostial first diagonal which is unchanged from most recent cardiac catheterization. The right coronary artery could not be engaged selectively but nonselective angiography showed no significant disease in the proximal and midsegment.       Cataract    DENIES   Chronic back pain    Chronic sinus bradycardia    CKD (chronic kidney disease), stage III (  [place])    Clotting disorder ([place])    DVT   COLITIS 12/02/2007   Qualifier: Diagnosis of  By: [name], [name]     Diabetes mellitus without complication [facility])    DENIES   Diverticulosis of colon (without mention of hemorrhage) 2007   Colonoscopy    Esophageal stricture    a. 2012 s/p dil.   Esophagitis, unspecified    a. 2012 EGD   EXTERNAL HEMORRHOIDS    GERD (gastroesophageal reflux disease)    omeprazole   H/O hiatal hernia    Hiatal hernia    a. 2012 EGD.   History of DVT in the past, not on Coumadin now    left  leg   HYPERCHOLESTEROLEMIA    Hypertension    ICD (implantable cardiac defibrillator) in place    a. s/p initial ICD  in 1993 in setting of cardiac arrest;  b. 01/2007 gen change: Guidant T135 Vitality DS VR single lead ICD.   Macular degeneration    gets injecton in eye every 5 weeks- last injection - 05/03/2013    Myocardial infarction [facility]) 1993   Near syncope 05/22/2015   Nephrolithiasis, just saw Dr [name]    Orthostatic hypotension    Peripheral vascular disease ([place])    ???   RECTAL BLEEDING 12/03/2007   Qualifier: Diagnosis of  By: [name]    Unspecified gastritis and gastroduodenitis without mention of hemorrhage    a. 2003 EGD->not noted on 2012 EGD.   Unstable angina, neg MI, cath stable.maybe GI 12/30/2013    Current Outpatient Medications:    acetaminophen (TYLENOL) 500 MG tablet, Take 1 tablet (500 mg total) by mouth every 6 (six) hours as needed for mild pain, fever or headache., Disp: 30 tablet, Rfl: 0   Alpha-Lipoic Acid 600 MG TABS, Take 600 mg by mouth daily. For neuropathy in legs, Disp: 90 tablet, Rfl: 3   carboxymethylcellulose (REFRESH PLUS) 0.5 % SOLN, Place 1 drop into both eyes 3 (three) times daily as needed (dry/irritated eyes.)., Disp: , Rfl:    Cholecalciferol (VITAMIN D) 50 MCG (2000 UT) CAPS, Take 2,000 Units by mouth every morning., Disp: , Rfl:    DULoxetine (CYMBALTA) 30 MG capsule, Take 1 capsule (30 mg total) by mouth daily. TO REPLACE PAXIL for mood/ pain, Disp: 90 capsule, Rfl: 0   ELIQUIS 2.5 MG TABS tablet, TAKE  (1)  TABLET TWICE A DAY. (Patient taking differently: Take 2.5 mg by mouth 2 (two) times daily.), Disp: 180 tablet, Rfl: 1   feeding supplement (ENSURE ENLIVE / ENSURE PLUS) LIQD, Take 237 mLs by mouth 2 (two) times daily between meals., Disp: , Rfl:    isosorbide mononitrate (IMDUR) 30 MG 24 hr tablet, Take 1 tablet (30 mg total) by mouth daily., Disp: 90 tablet, Rfl: 2   levothyroxine (SYNTHROID) 100 MCG tablet, Take 1 tablet (100 mcg total) by mouth daily. (Patient taking differently: Take 100 mcg by mouth every morning.), Disp: 30  tablet, Rfl: 5   MELATONIN PO, Take 3 mg by mouth at bedtime., Disp: , Rfl:    metoprolol tartrate (LOPRESSOR) 25 MG tablet, Take 0.5 tablets (12.5 mg total) by mouth 2 (two) times daily. HOLD FOR SBP<95 OR HR<65, Disp: , Rfl:    Multiple Vitamins-Minerals (CENTRUM SILVER ULTRA WOMENS PO), Take 1 tablet by mouth every morning., Disp: , Rfl:    nitroGLYCERIN (NITROSTAT) 0.4 MG SL tablet, Place 1 tablet (0.4 mg total) under the tongue every 5 (  five) minutes as needed for chest pain., Disp: 25 tablet, Rfl: 2   pantoprazole (PROTONIX) 40 MG tablet, TAKE 1 TABLET ONCE DAILY, Disp: 90 tablet, Rfl: 0   potassium chloride SA (KLOR-CON) 20 MEQ tablet, Take 1 tablet (20 mEq total) by mouth 2 (two) times daily., Disp: 2 tablet, Rfl: 0   rosuvastatin (CRESTOR) 10 MG tablet, TAKE 1 TABLET DAILY, Disp: 90 tablet, Rfl: 0   Alpha-Lipoic Acid 600 MG CAPS, Take 1 capsule by mouth daily. (Patient not taking: No sig reported), Disp: , Rfl:  Social History   Socioeconomic History   Marital status: Widowed    Spouse name: Not on file   Number of children: 4   Years of education: 8   Highest education level: 8th grade  Occupational History   Occupation: Retired  Tobacco Use   Smoking status: Never   Smokeless tobacco: Never  Vaping Use   Vaping Use: Never used  Substance and Sexual Activity   Alcohol use: No   Drug use: No   Sexual activity: Never    Birth control/protection: Post-menopausal  Other Topics Concern   Not on file  Social History Narrative   Not on file   Social Determinants of Health   Financial Resource Strain: Not on file  Food Insecurity: Not on file  Transportation Needs: Not on file  Physical Activity: Not on file  Stress: Stress Concern Present   Feeling of Stress : Rather much  Social Connections: Not on file  Intimate Partner Violence: Not on file   Family History  Problem Relation Age of Onset   Stroke Mother    Other Mother        brain tumor   Other Father         MI   Heart attack Father    Stroke Sister    Macular degeneration Sister    Diabetes Daughter    Cancer Daughter        ovarian   Colon polyps Daughter    Atrial fibrillation Sister    Hyperlipidemia Sister    Osteoporosis Sister    Stroke Sister    Uterine cancer Sister    Heart attack Brother    Heart disease Brother    Asthma Brother    Breast cancer Neg Hx     Objective: Office vital signs reviewed. BP 132/70   Pulse 60   Temp 97.7 F (36.5 C)   Ht '5\' 3"'$  (1.6 m)   Wt 121 lb 3.2 oz (55 kg)   SpO2 99%   BMI 21.47 kg/m   Physical Examination:  General: Awake, alert, frail, elderly female.  No acute distress HEENT: Normal; sclera white Cardio: Irregularly irregular with rate control, S1S2 heard, no murmurs appreciated Pulm: clear to auscultation bilaterally, no wheezes, rhonchi or rales; normal work of breathing on room air Chest: Pacemaker in place on the left side of the chest MSK: Ambulating with use of rolling walker  Assessment/ Plan: 87 y.o. female   Hypokalemia - Plan: Renal Function Panel, Magnesium  Loose stools  Labile blood pressure  Dysuria - Plan: Urinalysis, Routine w reflex microscopic, Urine Culture  Check potassium, renal function and magnesium level.  May consider keeping low-dose potassium on board temporarily pending lab result.  Blood pressure is labile but I think this has to do with not taking her medication correctly.  I reiterated the parameters of metoprolol use and that this should be only half a tablet per dose twice daily.  Given  dysuria, urinalysis was obtained.  Urinalysis showed blood, leukocytes and many bacteria under the microscope.  This been sent for culture.  Keflex started  Orders Placed This Encounter  Procedures   Urine Culture   Renal Function Panel   Magnesium   Urinalysis, Routine w reflex microscopic   No orders of the defined types were placed in this encounter.    [name], DO [place] [phone]

## 2020-09-08 LAB — RENAL FUNCTION PANEL
Albumin: 3.9 g/dL (ref 3.6–4.6)
BUN/Creatinine Ratio: 22 (ref 12–28)
BUN: 22 mg/dL (ref 8–27)
CO2: 17 mmol/L — ABNORMAL LOW (ref 20–29)
Calcium: 9.3 mg/dL (ref 8.7–10.3)
Chloride: 105 mmol/L (ref 96–106)
Creatinine, Ser: 1.01 mg/dL — ABNORMAL HIGH (ref 0.57–1.00)
Glucose: 138 mg/dL — ABNORMAL HIGH (ref 65–99)
Phosphorus: 4.2 mg/dL (ref 3.0–4.3)
Potassium: 4.7 mmol/L (ref 3.5–5.2)
Sodium: 137 mmol/L (ref 134–144)
eGFR: 54 mL/min/{1.73_m2} — ABNORMAL LOW (ref >59)

## 2020-09-08 LAB — MAGNESIUM: Magnesium: 2.2 mg/dL (ref 1.6–2.3)

## 2020-09-09 LAB — URINE CULTURE

## 2020-09-10 DIAGNOSIS — L97528 Non-pressure chronic ulcer of other part of left foot with other specified severity: Secondary | ICD-10-CM | POA: Diagnosis not present

## 2020-09-10 DIAGNOSIS — I4891 Unspecified atrial fibrillation: Secondary | ICD-10-CM | POA: Diagnosis not present

## 2020-09-10 DIAGNOSIS — L03116 Cellulitis of left lower limb: Secondary | ICD-10-CM | POA: Diagnosis not present

## 2020-09-10 DIAGNOSIS — E11621 Type 2 diabetes mellitus with foot ulcer: Secondary | ICD-10-CM | POA: Diagnosis not present

## 2020-09-10 DIAGNOSIS — Z95810 Presence of automatic (implantable) cardiac defibrillator: Secondary | ICD-10-CM | POA: Diagnosis not present

## 2020-09-10 DIAGNOSIS — I25119 Atherosclerotic heart disease of native coronary artery with unspecified angina pectoris: Secondary | ICD-10-CM | POA: Diagnosis not present

## 2020-09-10 NOTE — Progress Notes (Signed)
R/C about urine culture

## 2020-09-13 ENCOUNTER — Telehealth: Payer: Self-pay | Admitting: *Deleted

## 2020-09-13 NOTE — Telephone Encounter (Signed)
CALLED [name] WITH NO ANSWER

## 2020-09-13 NOTE — Telephone Encounter (Signed)
VM from [name] w/ [facility] Pt has rash on legs, abd & going around to back, it's even w/ skin & itchy, was started on Keflex on the 23rd

## 2020-09-13 NOTE — Telephone Encounter (Signed)
I thought this was addressed by Alyssa already on Friday.  I am not sure that this is a drug reaction or if this is something else going on.  If this is any message and the rash seems to be spreading, may favor discontinuation of Keflex.  However, this would warrant her needing evaluation in the ER for UTI as she is allergic to everything else outpatient

## 2020-09-14 ENCOUNTER — Ambulatory Visit: Payer: Medicare Other | Admitting: Family Medicine

## 2020-09-17 DIAGNOSIS — L97528 Non-pressure chronic ulcer of other part of left foot with other specified severity: Secondary | ICD-10-CM | POA: Diagnosis not present

## 2020-09-17 DIAGNOSIS — I25119 Atherosclerotic heart disease of native coronary artery with unspecified angina pectoris: Secondary | ICD-10-CM | POA: Diagnosis not present

## 2020-09-17 DIAGNOSIS — E11621 Type 2 diabetes mellitus with foot ulcer: Secondary | ICD-10-CM | POA: Diagnosis not present

## 2020-09-17 DIAGNOSIS — I4891 Unspecified atrial fibrillation: Secondary | ICD-10-CM | POA: Diagnosis not present

## 2020-09-17 DIAGNOSIS — Z95810 Presence of automatic (implantable) cardiac defibrillator: Secondary | ICD-10-CM | POA: Diagnosis not present

## 2020-09-17 DIAGNOSIS — L03116 Cellulitis of left lower limb: Secondary | ICD-10-CM | POA: Diagnosis not present

## 2020-09-21 ENCOUNTER — Ambulatory Visit: Payer: Medicare Other | Admitting: Gastroenterology

## 2020-09-22 DIAGNOSIS — G5793 Unspecified mononeuropathy of bilateral lower limbs: Secondary | ICD-10-CM | POA: Diagnosis not present

## 2020-09-22 DIAGNOSIS — D638 Anemia in other chronic diseases classified elsewhere: Secondary | ICD-10-CM | POA: Diagnosis not present

## 2020-09-22 DIAGNOSIS — I5032 Chronic diastolic (congestive) heart failure: Secondary | ICD-10-CM | POA: Diagnosis not present

## 2020-09-22 DIAGNOSIS — R809 Proteinuria, unspecified: Secondary | ICD-10-CM | POA: Diagnosis not present

## 2020-09-22 DIAGNOSIS — E872 Acidosis: Secondary | ICD-10-CM | POA: Diagnosis not present

## 2020-09-22 DIAGNOSIS — E876 Hypokalemia: Secondary | ICD-10-CM | POA: Diagnosis not present

## 2020-09-22 DIAGNOSIS — N1831 Chronic kidney disease, stage 3a: Secondary | ICD-10-CM | POA: Diagnosis not present

## 2020-09-22 DIAGNOSIS — I129 Hypertensive chronic kidney disease with stage 1 through stage 4 chronic kidney disease, or unspecified chronic kidney disease: Secondary | ICD-10-CM | POA: Diagnosis not present

## 2020-09-22 NOTE — Telephone Encounter (Signed)
No call back.

## 2020-09-23 DIAGNOSIS — Z86718 Personal history of other venous thrombosis and embolism: Secondary | ICD-10-CM | POA: Diagnosis not present

## 2020-09-23 DIAGNOSIS — Z95810 Presence of automatic (implantable) cardiac defibrillator: Secondary | ICD-10-CM | POA: Diagnosis not present

## 2020-09-23 DIAGNOSIS — I25119 Atherosclerotic heart disease of native coronary artery with unspecified angina pectoris: Secondary | ICD-10-CM | POA: Diagnosis not present

## 2020-09-23 DIAGNOSIS — E11621 Type 2 diabetes mellitus with foot ulcer: Secondary | ICD-10-CM | POA: Diagnosis not present

## 2020-09-23 DIAGNOSIS — R2681 Unsteadiness on feet: Secondary | ICD-10-CM | POA: Diagnosis not present

## 2020-09-23 DIAGNOSIS — I4891 Unspecified atrial fibrillation: Secondary | ICD-10-CM | POA: Diagnosis not present

## 2020-09-23 DIAGNOSIS — L97528 Non-pressure chronic ulcer of other part of left foot with other specified severity: Secondary | ICD-10-CM | POA: Diagnosis not present

## 2020-09-23 DIAGNOSIS — Z7901 Long term (current) use of anticoagulants: Secondary | ICD-10-CM | POA: Diagnosis not present

## 2020-09-23 DIAGNOSIS — L03116 Cellulitis of left lower limb: Secondary | ICD-10-CM | POA: Diagnosis not present

## 2020-09-27 ENCOUNTER — Ambulatory Visit (INDEPENDENT_AMBULATORY_CARE_PROVIDER_SITE_OTHER): Payer: Medicare Other

## 2020-09-27 VITALS — BP 169/79 | HR 60 | Ht 63.0 in | Wt 128.0 lb

## 2020-09-27 DIAGNOSIS — Z0000 Encounter for general adult medical examination without abnormal findings: Secondary | ICD-10-CM

## 2020-09-27 NOTE — Progress Notes (Signed)
Subjective:   [name] is a 87 y.o. female who presents for Medicare Annual (Subsequent) preventive examination  Virtual Visit via Telephone Note  I connected with  [name] on 09/27/20 at  1:15 PM EDT by telephone and verified that I am speaking with the correct person using two identifiers.  Location: Patient: Home  Provider: [facility] Persons participating in the virtual visit: patient/Nurse Health Advisor   I discussed the limitations, risks, security and privacy concerns of performing an evaluation and management service by telephone and the availability of in person appointments. The patient expressed understanding and agreed to proceed.  Interactive audio and video telecommunications were attempted between this nurse and patient, however failed, due to patient having technical difficulties OR patient did not have access to video capability.  We continued and completed visit with audio only.  Some vital signs may be absent or patient reported.   [name] E Stepanie Graver, LPN   Review of Systems     Cardiac Risk Factors include: advanced age (>85mn, >>43women);sedentary lifestyle;hypertension;Other (see comment);diabetes mellitus;dyslipidemia, Risk factor comments: atherosclerosis, A.Fib     Objective:    Today's Vitals   09/27/20 1310 09/27/20 1312  BP: (!) 169/79   Pulse: 60   Weight: 128 lb (58.1 kg)   Height: '5\' 3"'$  (1.6 m)   PainSc:  4    Body mass index is 22.67 kg/m.  Advanced Directives 08/31/2020 07/13/2020 07/13/2020 07/06/2020 06/21/2020 06/20/2020 06/03/2020  Does Patient Have a Medical Advance Directive? Yes Yes Yes Yes Yes No;Yes Yes  Type of Advance Directive Out of facility DNR (pink MOST or yellow form) Healthcare Power of [place] (pink MOST or yellow form) Out of facility DNR (pink MOST or yellow form) Out of facility DNR (pink MOST or yellow form) Out of facility DNR (pink MOST or yellow form) -  Does patient want to make changes to  medical advance directive? No - Patient declined Yes (ED - Information included in AVS) - No - Patient declined No - Patient declined - -  Copy of [place]? - - - - - - -  Would patient like information on creating a medical advance directive? - - - - No - Patient declined - -  Pre-existing out of facility DNR order (yellow form or pink MOST form) - - - Yellow form placed in chart (order not valid for inpatient use) - - -    Current Medications (verified) Outpatient Encounter Medications as of 09/27/2020  Medication Sig   [DISCONTINUED] pantoprazole (PROTONIX) 40 MG tablet Take by mouth.   acetaminophen (TYLENOL) 500 MG tablet Take 1 tablet (500 mg total) by mouth every 6 (six) hours as needed for mild pain, fever or headache.   Alpha-Lipoic Acid 600 MG CAPS Take 1 capsule by mouth daily. (Patient not taking: No sig reported)   Alpha-Lipoic Acid 600 MG TABS Take 600 mg by mouth daily. For neuropathy in legs   carboxymethylcellulose (REFRESH PLUS) 0.5 % SOLN Place 1 drop into both eyes 3 (three) times daily as needed (dry/irritated eyes.).   Cholecalciferol (VITAMIN D) 50 MCG (2000 UT) CAPS Take 2,000 Units by mouth every morning.   DULoxetine (CYMBALTA) 30 MG capsule Take 1 capsule (30 mg total) by mouth daily. TO REPLACE PAXIL for mood/ pain   ELIQUIS 2.5 MG TABS tablet TAKE  (1)  TABLET TWICE A DAY. (Patient taking differently: Take 2.5 mg by mouth 2 (two) times daily.)  feeding supplement (ENSURE ENLIVE / ENSURE PLUS) LIQD Take 237 mLs by mouth 2 (two) times daily between meals.   furosemide (LASIX) 20 MG tablet Take 20 mg by mouth daily.   gabapentin (NEURONTIN) 100 MG capsule Take 100 mg by mouth daily.   isosorbide mononitrate (IMDUR) 30 MG 24 hr tablet Take 1 tablet (30 mg total) by mouth daily.   levothyroxine (SYNTHROID) 100 MCG tablet Take 1 tablet (100 mcg total) by mouth daily. (Patient taking differently: Take 100 mcg by mouth every morning.)    MELATONIN PO Take 3 mg by mouth at bedtime.   metoprolol tartrate (LOPRESSOR) 25 MG tablet Take 0.5 tablets (12.5 mg total) by mouth 2 (two) times daily. HOLD FOR SBP<95 OR HR<65   Multiple Vitamins-Minerals (CENTRUM SILVER ULTRA WOMENS PO) Take 1 tablet by mouth every morning.   nitroGLYCERIN (NITROSTAT) 0.4 MG SL tablet Place 1 tablet (0.4 mg total) under the tongue every 5 (five) minutes as needed for chest pain.   pantoprazole (PROTONIX) 40 MG tablet TAKE 1 TABLET ONCE DAILY   potassium chloride SA (KLOR-CON) 20 MEQ tablet Take 1 tablet (20 mEq total) by mouth 2 (two) times daily.   rosuvastatin (CRESTOR) 10 MG tablet TAKE 1 TABLET DAILY   No facility-administered encounter medications on file as of 09/27/2020.    Allergies (verified) Sotalol, Tramadol, Actos [pioglitazone], Adhesive [tape], Atorvastatin, Bactrim [sulfamethoxazole-trimethoprim], Ciprofloxacin, Contrast media [iodinated diagnostic agents], Latex, Pedi-pre tape spray [wound dressing adhesive], and Sulfa antibiotics   History: Past Medical History:  Diagnosis Date   AICD (automatic cardioverter/defibrillator) present    Allergy    SESONAL   Aneurysm of infrarenal abdominal aorta (HCC)    Anxiety    ARTHRITIS    Arthritis    Atrial fibrillation (HCC)    CAD (coronary artery disease)    a. history of cardiac arrest 1993;  b. s/p LAD/LCX stenting in 2003;  c. 07/2011 Cath: LM nl, LAD patent stent, D1 80ost, LCX patent stent, RCA min irregs;  d. 04/2012 MV: EF 66%, no ishcemia;  e. 12/2013 Echo: Ef 55%, no rwma, Gr 1 DD, triv AI/MR, mildly dil LA;  f. 12/2013 Lexi MV: intermediate risk - apical ischemia and inf/infsept fixed defect, ? artifactual.   CAD in native artery 12/31/2013   Previous stents to LAD and Ramus patent on cath today 12/31/13 also There is severe disease in the ostial first diagonal which is unchanged from most recent cardiac catheterization. The right coronary artery could not be engaged selectively but  nonselective angiography showed no significant disease in the proximal and midsegment.       Cataract    DENIES   Chronic back pain    Chronic sinus bradycardia    CKD (chronic kidney disease), stage III (HCC)    Clotting disorder ([place])    DVT   COLITIS 12/02/2007   Qualifier: Diagnosis of  By: [name], [name]     Diabetes mellitus without complication ([place])    DENIES   Diverticulosis of colon (without mention of hemorrhage) 2007   Colonoscopy    Esophageal stricture    a. 2012 s/p dil.   Esophagitis, unspecified    a. 2012 EGD   EXTERNAL HEMORRHOIDS    GERD (gastroesophageal reflux disease)    omeprazole   H/O hiatal hernia    Hiatal hernia    a. 2012 EGD.   History of DVT in the past, not on Coumadin now    left  leg   HYPERCHOLESTEROLEMIA  Hypertension    ICD (implantable cardiac defibrillator) in place    a. s/p initial ICD in 1993 in setting of cardiac arrest;  b. 01/2007 gen change: Guidant T135 Vitality DS VR single lead ICD.   Macular degeneration    gets injecton in eye every 5 weeks- last injection - 05/03/2013    Myocardial infarction [facility]) 1993   Near syncope 05/22/2015   Nephrolithiasis, just saw Dr [name]    Orthostatic hypotension    Peripheral vascular disease ([place])    ???   RECTAL BLEEDING 12/03/2007   Qualifier: Diagnosis of  By: [name]    Unspecified gastritis and gastroduodenitis without mention of hemorrhage    a. 2003 EGD->not noted on 2012 EGD.   Unstable angina, neg MI, cath stable.maybe GI 12/30/2013   Past Surgical History:  Procedure Laterality Date   BREAST BIOPSY Left 11/2013   CARDIAC CATHETERIZATION     CARDIAC CATHETERIZATION  01/01/15   patent stents -there is severe disease in ostial 1st diag but without change.    CARDIAC DEFIBRILLATOR PLACEMENT  02/05/2007   Guidant   CARDIOVASCULAR STRESS TEST  12/30/2013   abnormal   CARDIOVERSION N/A 02/13/2019   Procedure: CARDIOVERSION;  Surgeon: Sanda Klein, MD;  Location: [place];  Service: Cardiovascular;  Laterality: N/A;   CATARACT EXTRACTION Right    Dr. Katy Fitch   COLONOSCOPY     COLONOSCOPY WITH PROPOFOL N/A 03/30/2020   Procedure: COLONOSCOPY WITH PROPOFOL;  Surgeon: Harvel Quale, MD;  [facility];  Service: Gastroenterology;  Laterality: N/A;   coronary stents      CYSTOSCOPY WITH URETEROSCOPY Right 05/20/2013   Procedure: CYSTOSCOPY, RIGHT URETEROSCOPY STONE EXTRACTION, Insertion of right DOUBLE J STENT ;  Surgeon: Irine Seal, MD;  Location: [facility];  Service: Urology;  Laterality: Right;   CYSTOSCOPY WITH URETEROSCOPY AND STENT PLACEMENT N/A 06/03/2013   Procedure: SECOND LOOK CYSTOSCOPY WITH URETEROSCOPY  HOLMIUM LASER LITHO AND STONE EXTRACTION Sammie Bench ;  Surgeon: Malka So, MD;  Location: [facility];  Service: Urology;  Laterality: N/A;   ESOPHAGOGASTRODUODENOSCOPY (EGD) WITH PROPOFOL N/A 11/20/2019   Procedure: ESOPHAGOGASTRODUODENOSCOPY (EGD) WITH PROPOFOL;  Surgeon: Daneil Dolin, MD;  [facility];  Service: Endoscopy;  Laterality: N/A;   GIVENS CAPSULE STUDY N/A 10/30/2016   Procedure: GIVENS CAPSULE STUDY;  Surgeon: Irene Shipper, MD;  Location: [facility] ENDOSCOPY;  Service: Endoscopy;  Laterality: N/A;  NEEDS TO BE ADMITTED FOR OBSERVATION PT. HAS DEFIB   HEMORROIDECTOMY  80's   HOLMIUM LASER APPLICATION Right [id number]   Procedure: HOLMIUM LASER APPLICATION;  Surgeon: Irine Seal, MD;  [facility]: [facility];  Service: Urology;  Laterality: Right;   HYSTEROSCOPY WITH D & C  09/02/2010   Procedure: DILATATION AND CURETTAGE (D&C) /HYSTEROSCOPY;  Surgeon: Margarette Asal;  Location: [facility];  Service: Gynecology;  Laterality: N/A;  Dilation and Curettage with Hysteroscopy and Polypectomy   IMPLANTABLE CARDIOVERTER DEFIBRILLATOR (ICD) GENERATOR CHANGE N/A 02/10/2014   Procedure: ICD GENERATOR CHANGE;  Surgeon: Sanda Klein, MD;  Location: [place] CATH LAB;  Service: Cardiovascular;  Laterality: N/A;   LEFT  HEART CATHETERIZATION WITH CORONARY ANGIOGRAM N/A 07/28/2011   Procedure: LEFT HEART CATHETERIZATION WITH CORONARY ANGIOGRAM;  Surgeon: Lorretta Harp, MD;  Location: [facility] CATH LAB;  Service: Cardiovascular;  Laterality: N/A;   LEFT HEART CATHETERIZATION WITH CORONARY ANGIOGRAM N/A 12/31/2013   Procedure: LEFT HEART CATHETERIZATION WITH CORONARY ANGIOGRAM;  Surgeon: Wellington Hampshire, MD;  Location:  [place] CATH LAB;  Service: Cardiovascular;  Laterality: N/A;   Family History  Problem Relation Age of Onset   Stroke Mother    Other Mother        brain tumor   Other Father        MI   Heart attack Father    Stroke Sister    Macular degeneration Sister    Diabetes Daughter    Cancer Daughter        ovarian   Colon polyps Daughter    Atrial fibrillation Sister    Hyperlipidemia Sister    Osteoporosis Sister    Stroke Sister    Uterine cancer Sister    Heart attack Brother    Heart disease Brother    Asthma Brother    Breast cancer Neg Hx    Social History   Socioeconomic History   Marital status: Widowed    Spouse name: Not on file   Number of children: 4   Years of education: 8   Highest education level: 8th grade  Occupational History   Occupation: Retired  Tobacco Use   Smoking status: Never   Smokeless tobacco: Never  Vaping Use   Vaping Use: Never used  Substance and Sexual Activity   Alcohol use: No   Drug use: No   Sexual activity: Never    Birth control/protection: Post-menopausal  Other Topics Concern   Not on file  Social History Narrative   Lives alone - son visits Sundays, daughter visits Saturday   Has an aide Chiquita Loth) 5 days per week   Social Determinants of Radio broadcast assistant Strain: Low Risk    Difficulty of Paying Living Expenses: Not hard at all  Food Insecurity: No Food Insecurity   Worried About Charity fundraiser in the Last Year: Never true   Arboriculturist in the Last Year: Never true  Transportation Needs: No  Transportation Needs   Lack of Transportation (Medical): No   Lack of Transportation (Non-Medical): No  Physical Activity: Inactive   Days of Exercise per Week: 0 days   Minutes of Exercise per Session: 0 min  Stress: Stress Concern Present   Feeling of Stress : To some extent  Social Connections: Moderately Isolated   Frequency of Communication with Friends and Family: More than three times a week   Frequency of Social Gatherings with Friends and Family: Twice a week   Attends Religious Services: 1 to 4 times per year   Active Member of [employer] or Organizations: No   Attends Archivist Meetings: Never   Marital Status: Widowed    Tobacco Counseling Counseling given: Not Answered   Clinical Intake:  Pre-visit preparation completed: Yes  Pain : 0-10 Pain Score: 4  Pain Type: Acute pain Pain Location: Leg Pain Orientation: Right, Left Pain Descriptors / Indicators: Burning, Discomfort, Shooting, Sharp Pain Onset: 1 to 4 weeks ago Pain Frequency: Intermittent     BMI - recorded: 22.67 Nutritional Status: BMI of 19-24  Normal Nutritional Risks: Nausea/ vomitting/ diarrhea, Unintentional weight gain (intermitten chronic diarrhea - swelling in legs - taking pepto-bismol and lasix - has appt next week) Diabetes: Yes CBG done?: No Did pt. bring in CBG monitor from home?: No  How often do you need to have someone help you when you read instructions, pamphlets, or other written materials from your doctor or pharmacy?: 4 - Often (poor vision)  Diabetic? Yes Nutrition Risk Assessment:  Has the patient  had any N/V/D within the last 2 months?  Yes  Does the patient have any non-healing wounds?  No  Has the patient had any unintentional weight loss or weight gain?  Yes   Diabetes:  Is the patient diabetic?  Yes  If diabetic, was a CBG obtained today?  No  Did the patient bring in their glucometer from home?  No  How often do you monitor your CBG's? Not at home.    Financial Strains and Diabetes Management:  Are you having any financial strains with the device, your supplies or your medication? No .  Does the patient want to be seen by Chronic Care Management for management of their diabetes?  No  Would the patient like to be referred to a Nutritionist or for Diabetic Management?  No   Diabetic Exams:  Diabetic Eye Exam: Completed 06/09/2020.   Diabetic Foot Exam: Completed 10/23/2018. Pt has been advised about the importance in completing this exam. Pt is scheduled for diabetic foot exam on 11/05/2020.    Interpreter Needed?: No  Information entered by :: Lagena Strand, LPN   Activities of Daily Living In your present state of health, do you have any difficulty performing the following activities: 09/27/2020 06/21/2020  Hearing? Tempie Donning  Vision? Y N  Difficulty concentrating or making decisions? Y N  Walking or climbing stairs? Y Y  Dressing or bathing? Y Y  Doing errands, shopping? Tempie Donning  Preparing Food and eating ? Y -  Using the Toilet? N -  In the past six months, have you accidently leaked urine? Y -  Comment usually can make it to toilet -  Do you have problems with loss of bowel control? Y -  Comment rarely -  Managing your Medications? Y -  Managing your Finances? Y -  Housekeeping or managing your Housekeeping? Y -  Some recent data might be hidden    Patient Care Team: [name], DO as PCP - General (Family Medicine) Sanda Klein, MD as PCP - Cardiology (Cardiology) Zadie Rhine Clent Demark, MD as Consulting Physician (Ophthalmology) Paralee Cancel, MD as Consulting Physician (Orthopedic Surgery) Sanda Klein, MD as Consulting Physician (Cardiology) Irene Shipper, MD as Consulting Physician (Gastroenterology) Eloise Harman, DO as Consulting Physician (Internal Medicine) Shea Evans Norva Riffle, LCSW as [place] (Licensed Clinical Social Worker)  Indicate any recent [place] you may  have received from other than [facility] providers in the past year (date may be approximate).     Assessment:   This is a routine wellness examination for [facility].  Hearing/Vision screen Hearing Screening - Comments:: C/o moderate hearing difficulties - has appt with audiologist in [place] next week Vision Screening - Comments:: Lots of trouble with vision even when wearing glasses - up to date with semi-annual visits with Dr Zadie Rhine  Dietary issues and exercise activities discussed: Current Exercise Habits: The patient does not participate in regular exercise at present, Exercise limited by: orthopedic condition(s);cardiac condition(s);neurologic condition(s)   Goals Addressed             This Visit's Progress    Exercise 3x per week (30 min per time)   Not on track    04/10/2019 AWV Goal: Exercise for General Health   Patient will verbalize understanding of the benefits of increased physical activity: Exercising regularly is important. It will improve your overall fitness, flexibility, and endurance. Regular exercise also will improve your overall health. It can help you control your weight, reduce  stress, and improve your bone density. Over the next year, patient will increase physical activity as tolerated with a goal of at least 150 minutes of moderate physical activity per week.  You can tell that you are exercising at a moderate intensity if your heart starts beating faster and you start breathing faster but can still hold a conversation. Moderate-intensity exercise ideas include: Walking 1 mile (1.6 km) in about 15 minutes Biking Hiking Golfing Dancing Water aerobics Patient will verbalize understanding of everyday activities that increase physical activity by providing examples like the following: Yard work, such as: Sales promotion account executive Gardening Washing windows or floors Patient will be able to  explain general safety guidelines for exercising:  Before you start a new exercise program, talk with your health care provider. Do not exercise so much that you hurt yourself, feel dizzy, or get very short of breath. Wear comfortable clothes and wear shoes with good support. Drink plenty of water while you exercise to prevent dehydration or heat stroke. Work out until your breathing and your heartbeat get faster.      Prevent falls   On track    04/10/2019 AWV Goal: Fall Prevention  Over the next year, patient will decrease their risk for falls by: Using assistive devices, such as a cane or walker, as needed Identifying fall risks within their home and correcting them by: Removing throw rugs Adding handrails to stairs or ramps Removing clutter and keeping a clear pathway throughout the home Increasing light, especially at night Adding shower handles/bars Raising toilet seat Identifying potential personal risk factors for falls: Medication side effects Incontinence/urgency Vestibular dysfunction Hearing loss Musculoskeletal disorders Neurological disorders Orthostatic hypotension         Depression Screen PHQ 2/9 Scores 09/27/2020 09/07/2020 08/23/2020 08/18/2020 06/29/2020 05/03/2020 01/26/2020  PHQ - 2 Score '3 3 2 2 4 '$ 0 0  PHQ- 9 Score '17 17 13 21 13 '$ - -    Fall Risk Fall Risk  09/27/2020 09/07/2020 08/18/2020 06/29/2020 05/03/2020  Falls in the past year? 0 0 0 0 0  Number falls in past yr: 0 - - - -  Injury with Fall? 0 - - - -  Risk for fall due to : Impaired balance/gait;Impaired vision;Medication side effect;Mental status change;Orthopedic patient - - - -  Follow up Falls prevention discussed;Education provided - - - -    FALL RISK PREVENTION PERTAINING TO THE HOME:  Any stairs in or around the home? No  If so, are there any without handrails? No  Home free of loose throw rugs in walkways, pet beds, electrical cords, etc? Yes  Adequate lighting in your home to reduce risk  of falls? Yes   ASSISTIVE DEVICES UTILIZED TO PREVENT FALLS:  Life alert? Yes  Use of a cane, walker or w/c? Yes  Grab bars in the bathroom? Yes  Shower chair or bench in shower? Yes  Elevated toilet seat or a handicapped toilet? Yes   TIMED UP AND GO:  Was the test performed? No . Telephonic visit  Cognitive Function: Cognitive status assessed by direct observation. Patient is unable to complete screening 6CIT or MMSE. Couldn't answer any questions  MMSE - Mini Mental State Exam 10/21/2014  Orientation to time 5  Orientation to Place 5  Registration 3  Attention/ Calculation 5  Recall 1  Language- name 2 objects 2  Language- repeat 1  Language- follow 3 step command 3  Language- read & follow direction 1  Write a sentence 1  Copy design 1  Total score 28     6CIT Screen 04/10/2019  What Year? 4 points  What month? 0 points  What time? 0 points  Count back from 20 0 points  Months in reverse 2 points  Repeat phrase 4 points  Total Score 10    Immunizations Immunization History  Administered Date(s) Administered   Fluad Quad(high Dose 65+) 10/23/2018, 01/05/2020   Influenza, High Dose Seasonal PF 11/01/2015, 11/17/2016, 11/13/2017   Influenza,inj,Quad PF,6+ Mos 11/11/2013, 10/21/2014   Influenza-Unspecified 11/16/2012, 10/23/2016   Pneumococcal Conjugate-13 11/18/2012   Pneumococcal Polysaccharide-23 01/17/2003   Tdap 05/09/2010   Zoster Recombinat (Shingrix) 08/18/2020   Zoster, Live 05/01/2011    TDAP status: Due, Education has been provided regarding the importance of this vaccine. Advised may receive this vaccine at local pharmacy or Health Dept. Aware to provide a copy of the vaccination record if obtained from local pharmacy or Health Dept. Verbalized acceptance and understanding.  Flu Vaccine status: Up to date  Pneumococcal vaccine status: Up to date  Covid-19 vaccine status: Declined, Education has been provided regarding the importance of this  vaccine but patient still declined. Advised may receive this vaccine at local pharmacy or Health Dept.or vaccine clinic. Aware to provide a copy of the vaccination record if obtained from local pharmacy or Health Dept. Verbalized acceptance and understanding. She had one dose - bad reaction - hospitalized  Qualifies for Shingles Vaccine? Yes   Zostavax completed Yes   Shingrix Completed?: No.    Education has been provided regarding the importance of this vaccine. Patient has been advised to call insurance company to determine out of pocket expense if they have not yet received this vaccine. Advised may also receive vaccine at local pharmacy or Health Dept. Verbalized acceptance and understanding.  Screening Tests Health Maintenance  Topic Date Due   COVID-19 Vaccine (1) Never done   FOOT EXAM  10/23/2019   URINE MICROALBUMIN  10/23/2019   HEMOGLOBIN A1C  02/15/2020   INFLUENZA VACCINE  08/16/2020   TETANUS/TDAP  08/16/2021 (Originally 05/08/2020)   Zoster Vaccines- Shingrix (2 of 2) 10/13/2020   OPHTHALMOLOGY EXAM  06/09/2021   PNA vac Low Risk Adult  Completed   HPV VACCINES  Aged Out   DEXA SCAN  Discontinued    Health Maintenance  Health Maintenance Due  Topic Date Due   COVID-19 Vaccine (1) Never done   FOOT EXAM  10/23/2019   URINE MICROALBUMIN  10/23/2019   HEMOGLOBIN A1C  02/15/2020   INFLUENZA VACCINE  08/16/2020    Colorectal cancer screening: No longer required.   Mammogram status: No longer required due to age.  Bone Densiey status: Declined  Lung Cancer Screening: (Low Dose CT Chest recommended if Age 12-80 years, 30 pack-year currently smoking OR have quit w/in 15years.) does not qualify.  Additional Screening:  Hepatitis C Screening: does not qualify  Vision Screening: Recommended annual ophthalmology exams for early detection of glaucoma and other disorders of the eye. Is the patient up to date with their annual eye exam?  Yes  Who is the provider or what  is the name of the office in which the patient attends annual eye exams? [facility] If pt is not established with a provider, would they like to be referred to a provider to establish care? No .   Dental Screening: Recommended annual dental exams for proper oral hygiene  Community Resource Referral / Chronic  Care Management: CRR required this visit?  No   CCM required this visit?  No      Plan:     I have personally reviewed and noted the following in the patient's chart:   Medical and social history Use of alcohol, tobacco or illicit drugs  Current medications and supplements including opioid prescriptions.  Functional ability and status Nutritional status Physical activity Advanced directives List of other physicians Hospitalizations, surgeries, and ER visits in previous 12 months Vitals Screenings to include cognitive, depression, and falls Referrals and appointments  In addition, I have reviewed and discussed with patient certain preventive protocols, quality metrics, and best practice recommendations. A written personalized care plan for preventive services as well as general preventive health recommendations were provided to patient.     Sandrea Hammond, LPN   [id number]   Nurse Notes: She is having diarrhea, weakness, whole body burning/stinging - no fever - covid test negative - Offered appt with NP this week but she declined - made appt with [facility] next Tuesday as pt requested

## 2020-09-27 NOTE — Patient Instructions (Signed)
[name] , Thank you for taking time to come for your Medicare Wellness Visit. I appreciate your ongoing commitment to your health goals. Please review the following plan we discussed and let me know if I can assist you in the future.   Screening recommendations/referrals: Colonoscopy: Done 03/30/2020 - no repeat Mammogram: Done 01/10/2018 - no repeat required, but can repeat every 2 years if desired Bone Density: Declined Recommended yearly ophthalmology/optometry visit for glaucoma screening and checkup Recommended yearly dental visit for hygiene and checkup  Vaccinations: Influenza vaccine: Done 01/05/2020 - Repeat annually Pneumococcal vaccine: Done 06/17/2003 & 11/18/2012 Tdap vaccine: Done 05/09/2010 - Repeat in 10 years  Shingles vaccine: Zostavax done 2013, Shingrix #1 done 08/18/2020 - get second dose in 2-6 months   Covid-19: One dose last year, bad reaction put her in hospital - no additional doses recommended  Conditions/risks identified: Practice fall prevention. Aim for 5-6 small meals and/or 2 Ensure supplements daily.  Next appointment: Follow up in one year for your annual wellness visit    Preventive Care 65 Years and Older, Female Preventive care refers to lifestyle choices and visits with your health care provider that can promote health and wellness. What does preventive care include? A yearly physical exam. This is also called an annual well check. Dental exams once or twice a year. Routine eye exams. Ask your health care provider how often you should have your eyes checked. Personal lifestyle choices, including: Daily care of your teeth and gums. Regular physical activity. Eating a healthy diet. Avoiding tobacco and drug use. Limiting alcohol use. Practicing safe sex. Taking low-dose aspirin every day. Taking vitamin and mineral supplements as recommended by your health care provider. What happens during an annual well check? The services and screenings done by  your health care provider during your annual well check will depend on your age, overall health, lifestyle risk factors, and family history of disease. Counseling  Your health care provider may ask you questions about your: Alcohol use. Tobacco use. Drug use. Emotional well-being. Home and relationship well-being. Sexual activity. Eating habits. History of falls. Memory and ability to understand (cognition). Work and work Statistician. Reproductive health. Screening  You may have the following tests or measurements: Height, weight, and BMI. Blood pressure. Lipid and cholesterol levels. These may be checked every 5 years, or more frequently if you are over 46 years old. Skin check. Lung cancer screening. You may have this screening every year starting at age 31 if you have a 30-pack-year history of smoking and currently smoke or have quit within the past 15 years. Fecal occult blood test (FOBT) of the stool. You may have this test every year starting at age 32. Flexible sigmoidoscopy or colonoscopy. You may have a sigmoidoscopy every 5 years or a colonoscopy every 10 years starting at age 49. Hepatitis C blood test. Hepatitis B blood test. Sexually transmitted disease (STD) testing. Diabetes screening. This is done by checking your blood sugar (glucose) after you have not eaten for a while (fasting). You may have this done every 1-3 years. Bone density scan. This is done to screen for osteoporosis. You may have this done starting at age 49. Mammogram. This may be done every 1-2 years. Talk to your health care provider about how often you should have regular mammograms. Talk with your health care provider about your test results, treatment options, and if necessary, the need for more tests. Vaccines  Your health care provider may recommend certain vaccines, such as: Influenza  vaccine. This is recommended every year. Tetanus, diphtheria, and acellular pertussis (Tdap, Td) vaccine. You  may need a Td booster every 10 years. Zoster vaccine. You may need this after age 45. Pneumococcal 13-valent conjugate (PCV13) vaccine. One dose is recommended after age 39. Pneumococcal polysaccharide (PPSV23) vaccine. One dose is recommended after age 12. Talk to your health care provider about which screenings and vaccines you need and how often you need them. This information is not intended to replace advice given to you by your health care provider. Make sure you discuss any questions you have with your health care provider. Document Released: 01/29/2015 Document Revised: 09/22/2015 Document Reviewed: 11/03/2014 Elsevier Interactive Patient Education  2017 [place] Prevention in the Home Falls can cause injuries. They can happen to people of all ages. There are many things you can do to make your home safe and to help prevent falls. What can I do on the outside of my home? Regularly fix the edges of walkways and driveways and fix any cracks. Remove anything that might make you trip as you walk through a door, such as a raised step or threshold. Trim any bushes or trees on the path to your home. Use bright outdoor lighting. Clear any walking paths of anything that might make someone trip, such as rocks or tools. Regularly check to see if handrails are loose or broken. Make sure that both sides of any steps have handrails. Any raised decks and porches should have guardrails on the edges. Have any leaves, snow, or ice cleared regularly. Use sand or salt on walking paths during winter. Clean up any spills in your garage right away. This includes oil or grease spills. What can I do in the bathroom? Use night lights. Install grab bars by the toilet and in the tub and shower. Do not use towel bars as grab bars. Use non-skid mats or decals in the tub or shower. If you need to sit down in the shower, use a plastic, non-slip stool. Keep the floor dry. Clean up any water that spills  on the floor as soon as it happens. Remove soap buildup in the tub or shower regularly. Attach bath mats securely with double-sided non-slip rug tape. Do not have throw rugs and other things on the floor that can make you trip. What can I do in the bedroom? Use night lights. Make sure that you have a light by your bed that is easy to reach. Do not use any sheets or blankets that are too big for your bed. They should not hang down onto the floor. Have a firm chair that has side arms. You can use this for support while you get dressed. Do not have throw rugs and other things on the floor that can make you trip. What can I do in the kitchen? Clean up any spills right away. Avoid walking on wet floors. Keep items that you use a lot in easy-to-reach places. If you need to reach something above you, use a strong step stool that has a grab bar. Keep electrical cords out of the way. Do not use floor polish or wax that makes floors slippery. If you must use wax, use non-skid floor wax. Do not have throw rugs and other things on the floor that can make you trip. What can I do with my stairs? Do not leave any items on the stairs. Make sure that there are handrails on both sides of the stairs and use them. Fix  handrails that are broken or loose. Make sure that handrails are as long as the stairways. Check any carpeting to make sure that it is firmly attached to the stairs. Fix any carpet that is loose or worn. Avoid having throw rugs at the top or bottom of the stairs. If you do have throw rugs, attach them to the floor with carpet tape. Make sure that you have a light switch at the top of the stairs and the bottom of the stairs. If you do not have them, ask someone to add them for you. What else can I do to help prevent falls? Wear shoes that: Do not have high heels. Have rubber bottoms. Are comfortable and fit you well. Are closed at the toe. Do not wear sandals. If you use a stepladder: Make  sure that it is fully opened. Do not climb a closed stepladder. Make sure that both sides of the stepladder are locked into place. Ask someone to hold it for you, if possible. Clearly mark and make sure that you can see: Any grab bars or handrails. First and last steps. Where the edge of each step is. Use tools that help you move around (mobility aids) if they are needed. These include: Canes. Walkers. Scooters. Crutches. Turn on the lights when you go into a dark area. Replace any light bulbs as soon as they burn out. Set up your furniture so you have a clear path. Avoid moving your furniture around. If any of your floors are uneven, fix them. If there are any pets around you, be aware of where they are. Review your medicines with your doctor. Some medicines can make you feel dizzy. This can increase your chance of falling. Ask your doctor what other things that you can do to help prevent falls. This information is not intended to replace advice given to you by your health care provider. Make sure you discuss any questions you have with your health care provider. Document Released: 10/29/2008 Document Revised: 06/10/2015 Document Reviewed: 02/06/2014 Elsevier Interactive Patient Education  2017 [employer].

## 2020-09-28 DIAGNOSIS — I25119 Atherosclerotic heart disease of native coronary artery with unspecified angina pectoris: Secondary | ICD-10-CM | POA: Diagnosis not present

## 2020-09-28 DIAGNOSIS — E11621 Type 2 diabetes mellitus with foot ulcer: Secondary | ICD-10-CM | POA: Diagnosis not present

## 2020-09-28 DIAGNOSIS — I4891 Unspecified atrial fibrillation: Secondary | ICD-10-CM | POA: Diagnosis not present

## 2020-09-28 DIAGNOSIS — L97528 Non-pressure chronic ulcer of other part of left foot with other specified severity: Secondary | ICD-10-CM | POA: Diagnosis not present

## 2020-09-28 DIAGNOSIS — L03116 Cellulitis of left lower limb: Secondary | ICD-10-CM | POA: Diagnosis not present

## 2020-09-28 DIAGNOSIS — Z95810 Presence of automatic (implantable) cardiac defibrillator: Secondary | ICD-10-CM | POA: Diagnosis not present

## 2020-09-30 ENCOUNTER — Ambulatory Visit (INDEPENDENT_AMBULATORY_CARE_PROVIDER_SITE_OTHER): Payer: Medicare Other | Admitting: Licensed Clinical Social Worker

## 2020-09-30 DIAGNOSIS — R001 Bradycardia, unspecified: Secondary | ICD-10-CM

## 2020-09-30 DIAGNOSIS — K580 Irritable bowel syndrome with diarrhea: Secondary | ICD-10-CM

## 2020-09-30 DIAGNOSIS — N1831 Chronic kidney disease, stage 3a: Secondary | ICD-10-CM

## 2020-09-30 DIAGNOSIS — F339 Major depressive disorder, recurrent, unspecified: Secondary | ICD-10-CM

## 2020-09-30 DIAGNOSIS — I10 Essential (primary) hypertension: Secondary | ICD-10-CM

## 2020-09-30 DIAGNOSIS — I2510 Atherosclerotic heart disease of native coronary artery without angina pectoris: Secondary | ICD-10-CM

## 2020-09-30 DIAGNOSIS — E1169 Type 2 diabetes mellitus with other specified complication: Secondary | ICD-10-CM

## 2020-09-30 DIAGNOSIS — E119 Type 2 diabetes mellitus without complications: Secondary | ICD-10-CM

## 2020-09-30 DIAGNOSIS — R1319 Other dysphagia: Secondary | ICD-10-CM

## 2020-09-30 DIAGNOSIS — I480 Paroxysmal atrial fibrillation: Secondary | ICD-10-CM

## 2020-09-30 NOTE — Patient Instructions (Signed)
Visit Information  PATIENT GOALS:  Goals Addressed             This Visit's Progress    Manage My Emotion: Manage anxiety issues; Manage Depression issues       Timeframe:  Short-Term Goal Priority:  Medium Progress: On Track Start Date:             08/23/20                Expected End Date:           12/23/20            Follow Up Date 11/12/20 at 2:15 PM   Manage Emotions; Manage anxiety issues faced; Manage depression issues faced    Why is this important?   When you are stressed, down or upset, your body reacts too.  For example, your blood pressure may get higher; you may have a headache or stomachache.  When your emotions get the best of you, your body's ability to fight off cold and flu gets weak.  These steps will help you manage your emotions.     Patient Self Care Activities:  Self administers medications as prescribed Attends all scheduled provider appointments Performs ADL's independently  Patient Coping Strengths:  Supportive Relationships Family Friends  Patient Self Care Deficits:  Mobility issues Depression and anxiety challenges Pain issues in her right knee  Patient Goals:  - spend time or talk with others at least 2 to 3 times per week - practice relaxation or meditation daily - keep a calendar with appointment dates  Follow Up Plan: LCSW to call client or family representative on 11/12/20 at 2:15 PM to assess client needs     Norva Riffle.Seferino Oscar MSW, LCSW Licensed Clinical Social Worker [facility] Care Management [phone]

## 2020-09-30 NOTE — Chronic Care Management (AMB) (Signed)
Chronic Care Management    Clinical Social Work Note  09/30/2020 Name: [name] MRN: [id number] DOB: 1931-04-19  [name] is a 87 y.o. year old female who is a primary care patient of [name], DO. The CCM team was consulted to assist the patient with chronic disease management and/or care coordination needs related to: [employer] .   Engaged with patient by telephone for follow up visit in response to provider referral for social work chronic care management and care coordination services.   Consent to Services:  The patient was given information about Chronic Care Management services, agreed to services, and gave verbal consent prior to initiation of services.  Please see initial visit note for detailed documentation.   Patient agreed to services and consent obtained.   Assessment: Review of patient past medical history, allergies, medications, and health status, including review of relevant consultants reports was performed today as part of a comprehensive evaluation and provision of chronic care management and care coordination services.     SDOH (Social Determinants of Health) assessments and interventions performed:  SDOH Interventions    Flowsheet Row Most Recent Value  SDOH Interventions   Physical Activity Interventions Other (Comments)  [difficulty in standing for a long period of time. Walking challenges]  Depression Interventions/Treatment  Currently on Treatment        Advanced Directives Status: See Vyn[employer] application for related entries.  CCM Care Plan  Allergies  Allergen Reactions   Sotalol Other (See Comments)    Torsades    Tramadol Other (See Comments)    Hallucination, bad-dream   Actos [Pioglitazone] Swelling   Adhesive [Tape] Other (See Comments)    Causes sores   Atorvastatin Other (See Comments)    myalgia   Bactrim [Sulfamethoxazole-Trimethoprim] Itching and Rash    Itchy rash   Ciprofloxacin Nausea Only    Contrast Media [Iodinated Diagnostic Agents] Other (See Comments)    Headache (no action/pre-med required)   Latex Rash   Pedi-Pre Tape Spray [Wound Dressing Adhesive] Other (See Comments)    Causes sores   Sulfa Antibiotics Other (See Comments)    Causes sores    Outpatient Encounter Medications as of 09/30/2020  Medication Sig   acetaminophen (TYLENOL) 500 MG tablet Take 1 tablet (500 mg total) by mouth every 6 (six) hours as needed for mild pain, fever or headache.   Alpha-Lipoic Acid 600 MG CAPS Take 1 capsule by mouth daily. (Patient not taking: No sig reported)   Alpha-Lipoic Acid 600 MG TABS Take 600 mg by mouth daily. For neuropathy in legs   carboxymethylcellulose (REFRESH PLUS) 0.5 % SOLN Place 1 drop into both eyes 3 (three) times daily as needed (dry/irritated eyes.).   Cholecalciferol (VITAMIN D) 50 MCG (2000 UT) CAPS Take 2,000 Units by mouth every morning.   DULoxetine (CYMBALTA) 30 MG capsule Take 1 capsule (30 mg total) by mouth daily. TO REPLACE PAXIL for mood/ pain   ELIQUIS 2.5 MG TABS tablet TAKE  (1)  TABLET TWICE A DAY. (Patient taking differently: Take 2.5 mg by mouth 2 (two) times daily.)   feeding supplement (ENSURE ENLIVE / ENSURE PLUS) LIQD Take 237 mLs by mouth 2 (two) times daily between meals.   furosemide (LASIX) 20 MG tablet Take 20 mg by mouth daily.   gabapentin (NEURONTIN) 100 MG capsule Take 100 mg by mouth daily.   isosorbide mononitrate (IMDUR) 30 MG 24 hr tablet Take 1 tablet (30 mg total) by mouth daily.  levothyroxine (SYNTHROID) 100 MCG tablet Take 1 tablet (100 mcg total) by mouth daily. (Patient taking differently: Take 100 mcg by mouth every morning.)   MELATONIN PO Take 3 mg by mouth at bedtime.   metoprolol tartrate (LOPRESSOR) 25 MG tablet Take 0.5 tablets (12.5 mg total) by mouth 2 (two) times daily. HOLD FOR SBP<95 OR HR<65   Multiple Vitamins-Minerals (CENTRUM SILVER ULTRA WOMENS PO) Take 1 tablet by mouth every morning.   nitroGLYCERIN  (NITROSTAT) 0.4 MG SL tablet Place 1 tablet (0.4 mg total) under the tongue every 5 (five) minutes as needed for chest pain.   pantoprazole (PROTONIX) 40 MG tablet TAKE 1 TABLET ONCE DAILY   potassium chloride SA (KLOR-CON) 20 MEQ tablet Take 1 tablet (20 mEq total) by mouth 2 (two) times daily.   rosuvastatin (CRESTOR) 10 MG tablet TAKE 1 TABLET DAILY   No facility-administered encounter medications on file as of 09/30/2020.    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   Intractable abdominal pain 03/28/2020   Lactic acidosis 03/28/2020   Elevated troponin I level 03/28/2020   Malnutrition of moderate degree 12/26/2019   Prolonged QT interval 12/23/2019   Acute lower UTI 12/23/2019   Constipation 11/27/2019   Diarrhea 11/27/2019   Lesion of pancreas 11/26/2019   Fibromuscular dysplasia of right renal artery ([place]) 11/26/2019   Left renal artery stenosis (H[facility]) 11/26/2019   RUQ pain    Abnormal CT of the abdomen    Vertigo 11/15/2019   Advanced care planning/counseling discussion 11/15/2019   CAD (coronary artery disease)    Hypothyroidism    Hyponatremia    Volume depletion    TSH elevation 08/01/2019   Hospital discharge follow-up 08/01/2019   Exudative age-related macular degeneration of right eye with inactive choroidal neovascularization ([place]) 04/30/2019   Retinal hemorrhage of right eye 04/30/2019   Advanced nonexudative age-related macular degeneration of left eye with subfoveal involvement 04/30/2019   Exudative age-related macular degeneration of left eye with inactive choroidal neovascularization ([place]) 04/30/2019   Advanced nonexudative age-related macular degeneration of right eye with subfoveal involvement 04/30/2019   Posterior vitreous detachment of both eyes 04/30/2019   Vitreous degeneration,  bilateral 04/30/2019   Secondary hypercoagulable state ([place]) 03/11/2019   Persistent atrial fibrillation (HCC)    Proctitis 01/09/2019   Atrial fibrillation, chronic ([place]) 01/09/2019   Hyperlipidemia 01/09/2019   Nausea 01/09/2019   Hyperglycemia 01/09/2019   COVID-19 virus infection 12/28/2018   Chronic diarrhea 09/18/2018   Abdominal pain 09/18/2018   Osteoarthritis of right knee 03/29/2018   GERD (gastroesophageal reflux disease)    Dehydration 02/06/2018   Hypokalemia 02/06/2018   Diarrhea of presumed infectious origin 02/06/2018   Acute kidney injury superimposed on CKD ([place]) 02/05/2018   Essential hypertension 12/21/2017   Degenerative scoliosis 02/19/2017   Burst fracture of lumbar vertebra ([place]) 02/19/2017   History of drug-induced prolonged QT interval with torsade de pointes 11/01/2016   Iron deficiency anemia 10/30/2016   Long term current use of anticoagulant 02/17/2016   Near syncope 05/22/2015   Diabetes mellitus type 2, diet-controlled ([place]) 09/14/2014   ICD (implantable cardioverter-defibrillator) battery depletion 01/20/2014   Abnormal nuclear stress test, 12/30/13 12/31/2013   Coronary artery disease involving native coronary artery of native heart without angina pectoris 12/31/2013   Paroxysmal atrial fibrillation, chads2 Vasc2 score of 5,  on eliquis 10/16/2013   Catecholaminergic polymorphic ventricular tachycardia ([place]) 10/16/2013   Ureteral stone with hydronephrosis Q000111Q   Metabolic syndrome [id number]   Orthostatic hypotension 07/27/2011   Chest pain 07/25/2011   Weakness 07/25/2011    Class: Acute   Chronic sinus bradycardia 07/25/2011   ICD (implantable cardioverter-defibrillator) in place 07/25/2011   CKD (chronic kidney disease), stage III ([place]) 07/25/2011    Class: Chronic   History of DVT in the past, on eliquis now 07/25/2011    Class: History of   Nephrolithiasis, just saw Dr [name] 07/25/2011    Class: History of   DYSPHAGIA  03/24/2010   Chronic ischemic heart disease 12/03/2007   Irritable bowel syndrome with diarrhea 12/03/2007   EPIGASTRIC PAIN 12/03/2007   Hyperlipidemia associated with type 2 diabetes mellitus ([place]) 12/02/2007    Class: History of   EXTERNAL HEMORRHOIDS 12/02/2007    Class: History of   ESOPHAGITIS 12/02/2007   GASTRITIS 12/02/2007   DIVERTICULOSIS, COLON 12/02/2007   ARTHRITIS 12/02/2007    Class: Chronic    Conditions to be addressed/monitored: monitor client management of depression and anxiety  Care Plan : LCSW care plan  Updates made by Katha Cabal, LCSW since 09/30/2020 12:00 AM     Problem: Emotional Distress      Goal: Emotional Health Supported; Manage depression symptoms and manage anxiety symptoms   Start Date: 08/23/2020  Expected End Date: 12/23/2020  This Visit's Progress: On track  Recent Progress: On track  Priority: Medium  Note:   Current Barriers:  Chronic Mental Health needs related to depression and anxiety Mobility issues (uses a walker to help her walk) Suicidal Ideation/Homicidal Ideation: No Pain issue in right knee Vision needs   Clinical Social Work Goal(s):  patient will work with SW monthly by telephone or in person to reduce or manage symptoms related to depression and anxiety Patient will work with SW monthly by telephone or in person to discuss mobiltiy of client and pain issues of client  Interventions:  1:1 collaboration with [name], DO regarding development and update of comprehensive plan of care as evidenced by provider attestation and co-signature Talked with [name] about her needs Talked with [name] about family support (support from her 3 sons and from her daughter) Talked with [name] about DME in home (cane, rolling walker, wheelchair, eye glasses, 3 in 1 bedside commode) Talked with [name] about recent care for large toe on her left foot . She sees Dr. Steffanie Rainwater, podiatrist, for her podiatry care  at this time.  She said she takes medication prescribed for her foot Talked with [name] about her in home support with CAP program aide. CAP program aide helps her Monday through Friday for 8 hours each day. Talked with [name] about mood of client. She said she sometimes is sad or depressed. She said she gets anxious occasionally. She said that her brother is ill; her sister is ill. She said her niece is under [place]. .These family health issues cause her to be anxious on occasion Talked with clinet about ambulation (she uses a walker to help her walk) Talked with client about difficulty standing (she said sometimes she is dizzy when she first stands up from a sitting position) Talked with client about decreased energy of client Talked with client about her use of Boost or Ensure supplements. She said she tries to drink 3 supplement drinks per day. She said she has decreased appetite Talked with client about RNCM  support with CCM program Provided counseling support for client Talked with client about decreased sleep of client Talked with client about medication procurement of client Client informed LCSW that she had episodes of blacking out last night (several episodes per client).  She has a lift chair she uses to help her sleep  Patient Self Care Activities:  Self administers medications as prescribed Attends all scheduled provider appointments Performs ADL's independently  Patient Coping Strengths:  Supportive Relationships Family Friends  Patient Self Care Deficits:  Mobility issues Depression and anxiety challenges Pain issues in her right knee  Patient Goals:  - spend time or talk with others at least 2 to 3 times per week - practice relaxation or meditation daily - keep a calendar with appointment dates  Follow Up Plan: LCSW to call client or family representative on 11/12/20 at 2:15 PM  to assess client needs      Norva Riffle.Kaho Selle MSW, LCSW Licensed Clinical  Social Worker [facility] Care Management [phone]

## 2020-10-04 ENCOUNTER — Other Ambulatory Visit: Payer: Self-pay

## 2020-10-04 ENCOUNTER — Encounter: Payer: Self-pay | Admitting: Cardiovascular Disease

## 2020-10-04 ENCOUNTER — Ambulatory Visit (INDEPENDENT_AMBULATORY_CARE_PROVIDER_SITE_OTHER): Payer: Medicare Other | Admitting: Cardiovascular Disease

## 2020-10-04 VITALS — BP 164/72 | HR 52 | Ht 63.0 in | Wt 120.8 lb

## 2020-10-04 DIAGNOSIS — Z7901 Long term (current) use of anticoagulants: Secondary | ICD-10-CM | POA: Diagnosis not present

## 2020-10-04 DIAGNOSIS — I25119 Atherosclerotic heart disease of native coronary artery with unspecified angina pectoris: Secondary | ICD-10-CM

## 2020-10-04 DIAGNOSIS — E119 Type 2 diabetes mellitus without complications: Secondary | ICD-10-CM | POA: Diagnosis not present

## 2020-10-04 DIAGNOSIS — I480 Paroxysmal atrial fibrillation: Secondary | ICD-10-CM | POA: Diagnosis not present

## 2020-10-04 DIAGNOSIS — I4729 Other ventricular tachycardia: Secondary | ICD-10-CM

## 2020-10-04 DIAGNOSIS — R4189 Other symptoms and signs involving cognitive functions and awareness: Secondary | ICD-10-CM

## 2020-10-04 DIAGNOSIS — Z711 Person with feared health complaint in whom no diagnosis is made: Secondary | ICD-10-CM

## 2020-10-04 DIAGNOSIS — N1831 Chronic kidney disease, stage 3a: Secondary | ICD-10-CM

## 2020-10-04 DIAGNOSIS — I700 Atherosclerosis of aorta: Secondary | ICD-10-CM | POA: Diagnosis not present

## 2020-10-04 DIAGNOSIS — K551 Chronic vascular disorders of intestine: Secondary | ICD-10-CM

## 2020-10-04 DIAGNOSIS — Z95810 Presence of automatic (implantable) cardiac defibrillator: Secondary | ICD-10-CM | POA: Diagnosis not present

## 2020-10-04 DIAGNOSIS — I472 Ventricular tachycardia: Secondary | ICD-10-CM

## 2020-10-04 DIAGNOSIS — Z5181 Encounter for therapeutic drug level monitoring: Secondary | ICD-10-CM | POA: Diagnosis not present

## 2020-10-04 DIAGNOSIS — E7800 Pure hypercholesterolemia, unspecified: Secondary | ICD-10-CM

## 2020-10-04 DIAGNOSIS — I10 Essential (primary) hypertension: Secondary | ICD-10-CM

## 2020-10-04 DIAGNOSIS — Z79899 Other long term (current) drug therapy: Secondary | ICD-10-CM

## 2020-10-04 MED ORDER — AMIODARONE HCL 200 MG PO TABS: 200.0000 mg | ORAL_TABLET | Freq: Every day | ORAL | 3 refills | Status: DC

## 2020-10-04 MED ORDER — METOPROLOL TARTRATE 25 MG PO TABS: 25.0000 mg | ORAL_TABLET | Freq: Two times a day (BID) | ORAL | 1 refills | Status: DC

## 2020-10-04 MED ORDER — METOPROLOL TARTRATE 25 MG PO TABS: 12.5000 mg | ORAL_TABLET | Freq: Two times a day (BID) | ORAL | 1 refills | Status: DC

## 2020-10-04 NOTE — Patient Instructions (Addendum)
Medication Instructions:   Start amiodarone 200 mg once daily Please call us if your heart rate is less than 50 bpm.  *If you need a refill on your cardiac medications before your next appointment, please call your pharmacy*   Lab Work: None ordered If you have labs (blood work) drawn today and your tests are completely normal, you will receive your results only by: [place] (if you have MyChart) OR A paper copy in the mail If you have any lab test that is abnormal or we need to change your treatment, we will call you to review the results.   Testing/Procedures: None ordered   Follow-Up: At [facility], you and your health needs are our priority.  As part of our continuing mission to provide you with exceptional heart care, we have created designated Provider Care Teams.  These Care Teams include your primary Cardiologist (physician) and Advanced Practice Providers (APPs -  Physician Assistants and Nurse Practitioners) who all work together to provide you with the care you need, when you need it.  We recommend signing up for the patient portal called "MyChart".  Sign up information is provided on this After Visit Summary.  MyChart is used to connect with patients for Virtual Visits (Telemedicine).  Patients are able to view lab/test results, encounter notes, upcoming appointments, etc.  Non-urgent messages can be sent to your provider as well.   To learn more about what you can do with MyChart, go to [email or web address].    Your next appointment:   3 month(s)  The format for your next appointment:   In Person  Provider:   Sanda Klein, MD

## 2020-10-04 NOTE — Progress Notes (Signed)
. Patient ID: [name], female   DOB: 08-Sep-1931, 87 y.o.   MRN: [id number]    Cardiology Office Note    Date:  10/04/2020   ID:  [name], DOB April 08, 1931, MRN [id number]  PCP:  [name], DO  Cardiologist:   Sanda Klein, MD   Chief Complaint  Patient presents with   Abdominal Pain        Chest Pain   Weight Loss     History of Present Illness:  [name] is a 87 y.o. 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  She has had a very difficult year.  She has had about a dozen emergency room visits and at least 2 hospitalizations.  She presented with a couple of episodes of abdominal pain evaluated in the emergency room in February and was finally hospitalized in March 2022.  She underwent a colonoscopy that showed diverticulosis and a carpet polyp (not removed due to anticoagulation), but no other major findings.  CT of the abdomen described a focal dissection of the infrarenal aorta (old).  There was no evidence of acute mesenteric ischemia, but the patient was diagnosed as probably having intestinal angina with microvascular etiology (versus bowel hypersensitivity), and improvement was reportedly noted with isosorbide mononitrate 30 mg daily..  Her abdominal pain is a "knotting sensation",  clearly worsened by food and she has developed some degree of food aversion.  She has to eat very small meals.  Her nephrologist has been encouraging her to make sure that  she drinks enough fluid.   As a result, she has gradually lost about 20 pounds in the last year.  She was later hospitalized in June with cellulitis related to an ulceration of the left great toe for about 4 days.  She was seen again in the emergency room with left leg pain (without evidence of recurrent infection) had a CT angiogram of the chest abdomen and pelvis, describing the presence of aortic atherosclerosis and "old appearing focal dissection flap or a penetrating ulcer in the distal abdominal aorta, lumen measures up to 2.1 cm at this level, no acute dissection, no aneurysm".  The focal dissection was also present on the study in March 2022.  The celiac artery, superior mesenteric artery and inferior mesenteric artery are specifically described as patent.  The right renal artery described as irregular, possible fibromuscular dysplasia without mention of actual stenosis.  There was no evidence of hepatic biliary duct dilation or gallstones.  Indeterminant 7 mm hypodense lesion in the neck of the pancreas was described, although this was not seen on the study from March 2022 and February 2020.  She has extensive chronic nephrolithiasis/nephrocalcinosis and T12 and L1 compression fractures which are old  Seen in the emergency room again with chest pain in mid August.  Her ECG shows atrial fibrillation with controlled ventricular response, but was interpreted as showing "sinus or ectopic rhythm".  Troponin assays were negative. Potassium was 3.1.  In the past, she has frequently had episodes of chest pain related to paroxysmal atrial fibrillation  In the past, she has had complaints  of fatigue related to bradycardia from amiodarone and metoprolol.  Reportedly, during her hospitalization in March 2022, she had severe bradycardia with rates in the 30s.  I do not believe this is possible since her defibrillator is set with a lower rate limit for ventricular pacing at 40 bpm.  In addition I reviewed all of  the ECG tracings and CV monitor strips from that hospitalization.  The heart rate was never slower than 57 bpm on the any of these documents.  Based on the bradycardia, the discharge summary from March recommended complete discontinuation of amiodarone.  She went to a nursing home.    Subsequent ER visits and office appointments all the way to 08/18/2020 still list amiodarone as an active medication, although the amiodarone was finally permanently removed from her medication list on 08/18/2020.  Comprehensive interrogation of her device today shows normal function.  She has a Armed forces technical officer implanted in 2016.  Estimated battery longevity is 10.5 years.  She only has 4% ventricular pacing.  She has not had any episodes of sustained or nonsustained ventricular tachycardia since April 2021.  In recent years she has had increasing problems with symptomatic atrial fibrillation.  Although she has a single chamber defibrillator, making it impossible to precisely diagnose the episodes of atrial fibrillation, there is clear evidence of periods of increased ventricular rate from her baseline, likely representing symptomatic atrial fibrillation.  She develops palpitation, chest discomfort, shortness of breath and if the spells are lengthy she eventually develops overt lower extremity edema.  She underwent a cardioversion in January 2021 from atrial fibrillation to normal rhythm, associated with improvement in symptoms.  She has been taking amiodarone since February.  She was hospitalized with dizziness, orthostatic hypotension and severe hypothyroidism (TSH 56) in July of this year.  It is likely she had iatrogenic hypothyroidism due to amiodarone.  Levothyroxine was started, beta-blocker therapy was discontinued.  She was on treatment with disopyramide for years for her history of catecholaminergic polymorphic VT, but this medication had to be gradually discontinued due to side  effects, especially orthostatic hypotension and dry mouth.  In the past she developed torsades de pointes with sotalol and diarrhea with quinidine.  She has not had defibrillator discharges since the 1990s.   Past Medical History:  Diagnosis Date   AICD (automatic cardioverter/defibrillator) present    Allergy    SESONAL   Aneurysm of infrarenal abdominal aorta (HCC)    Anxiety    ARTHRITIS    Arthritis    Atrial fibrillation (HCC)    CAD (coronary artery disease)    a. history of cardiac arrest 1993;  b. s/p LAD/LCX stenting in 2003;  c. 07/2011 Cath: LM nl, LAD patent stent, D1 80ost, LCX patent stent, RCA min irregs;  d. 04/2012 MV: EF 66%, no ishcemia;  e. 12/2013 Echo: Ef 55%, no rwma, Gr 1 DD, triv AI/MR, mildly dil LA;  f. 12/2013 Lexi MV: intermediate risk - apical ischemia and inf/infsept fixed defect, ? artifactual.   CAD in native artery 12/31/2013   Previous stents to LAD and Ramus patent on cath today 12/31/13 also There is severe disease in the ostial first diagonal which is unchanged from most recent cardiac catheterization. The right coronary artery could not be engaged selectively but nonselective angiography showed no significant disease in the proximal and midsegment.       Cataract    DENIES   Chronic back pain    Chronic sinus bradycardia  CKD (chronic kidney disease), stage III ([place])    Clotting disorder ([place])    DVT   COLITIS 12/02/2007   Qualifier: Diagnosis of  By: [name], [name]     Diabetes mellitus without complication ([place])    DENIES   Diverticulosis of colon (without mention of hemorrhage) 2007   Colonoscopy    Esophageal stricture    a. 2012 s/p dil.   Esophagitis, unspecified    a. 2012 EGD   EXTERNAL HEMORRHOIDS    GERD (gastroesophageal reflux disease)    omeprazole   H/O hiatal hernia    Hiatal hernia    a. 2012 EGD.   History of DVT in the past, not on Coumadin now    left  leg   HYPERCHOLESTEROLEMIA    Hypertension    ICD  (implantable cardiac defibrillator) in place    a. s/p initial ICD in 1993 in setting of cardiac arrest;  b. 01/2007 gen change: Guidant T135 Vitality DS VR single lead ICD.   Macular degeneration    gets injecton in eye every 5 weeks- last injection - 05/03/2013    Myocardial infarction [facility]) 1993   Near syncope 05/22/2015   Nephrolithiasis, just saw Dr [name]    Orthostatic hypotension    Peripheral vascular disease ([place])    ???   RECTAL BLEEDING 12/03/2007   Qualifier: Diagnosis of  By: [name]    Unspecified gastritis and gastroduodenitis without mention of hemorrhage    a. 2003 EGD->not noted on 2012 EGD.   Unstable angina, neg MI, cath stable.maybe GI 12/30/2013    Past Surgical History:  Procedure Laterality Date   BREAST BIOPSY Left 11/2013   CARDIAC CATHETERIZATION     CARDIAC CATHETERIZATION  01/01/15   patent stents -there is severe disease in ostial 1st diag but without change.    CARDIAC DEFIBRILLATOR PLACEMENT  02/05/2007   Guidant   CARDIOVASCULAR STRESS TEST  12/30/2013   abnormal   CARDIOVERSION N/A 02/13/2019   Procedure: CARDIOVERSION;  Surgeon: Sanda Klein, MD;  Location: [place];  Service: Cardiovascular;  Laterality: N/A;   CATARACT EXTRACTION Right    Dr. Katy Fitch   COLONOSCOPY     COLONOSCOPY WITH PROPOFOL N/A 03/30/2020   Procedure: COLONOSCOPY WITH PROPOFOL;  Surgeon: Harvel Quale, MD;  [facility];  Service: Gastroenterology;  Laterality: N/A;   coronary stents      CYSTOSCOPY WITH URETEROSCOPY Right 05/20/2013   Procedure: CYSTOSCOPY, RIGHT URETEROSCOPY STONE EXTRACTION, Insertion of right DOUBLE J STENT ;  Surgeon: Irine Seal, MD;  Location: [facility];  Service: Urology;  Laterality: Right;   CYSTOSCOPY WITH URETEROSCOPY AND STENT PLACEMENT N/A 06/03/2013   Procedure: SECOND LOOK CYSTOSCOPY WITH URETEROSCOPY  HOLMIUM LASER LITHO AND STONE EXTRACTION Sammie Bench ;  Surgeon: Malka So, MD;  Location: [facility];   Service: Urology;  Laterality: N/A;   ESOPHAGOGASTRODUODENOSCOPY (EGD) WITH PROPOFOL N/A 11/20/2019   Procedure: ESOPHAGOGASTRODUODENOSCOPY (EGD) WITH PROPOFOL;  Surgeon: Daneil Dolin, MD;  [facility];  Service: Endoscopy;  Laterality: N/A;   GIVENS CAPSULE STUDY N/A 10/30/2016   Procedure: GIVENS CAPSULE STUDY;  Surgeon: Irene Shipper, MD;  Location: [facility] ENDOSCOPY;  Service: Endoscopy;  Laterality: N/A;  NEEDS TO BE ADMITTED FOR OBSERVATION PT. HAS DEFIB   HEMORROIDECTOMY  80's   HOLMIUM LASER APPLICATION Right [id number]   Procedure: HOLMIUM LASER APPLICATION;  Surgeon: Irine Seal, MD;  Location: [facility];  Service: Urology;  Laterality:  Right;   HYSTEROSCOPY WITH D & C  09/02/2010   Procedure: DILATATION AND CURETTAGE (D&C) /HYSTEROSCOPY;  Surgeon: Margarette Asal;  Location: [place] [facility];  Service: Gynecology;  Laterality: N/A;  Dilation and Curettage with Hysteroscopy and Polypectomy   IMPLANTABLE CARDIOVERTER DEFIBRILLATOR (ICD) GENERATOR CHANGE N/A 02/10/2014   Procedure: ICD GENERATOR CHANGE;  Surgeon: Sanda Klein, MD;  Location: [place] CATH LAB;  Service: Cardiovascular;  Laterality: N/A;   LEFT HEART CATHETERIZATION WITH CORONARY ANGIOGRAM N/A 07/28/2011   Procedure: LEFT HEART CATHETERIZATION WITH CORONARY ANGIOGRAM;  Surgeon: Lorretta Harp, MD;  Location: [facility] CATH LAB;  Service: Cardiovascular;  Laterality: N/A;   LEFT HEART CATHETERIZATION WITH CORONARY ANGIOGRAM N/A 12/31/2013   Procedure: LEFT HEART CATHETERIZATION WITH CORONARY ANGIOGRAM;  Surgeon: Wellington Hampshire, MD;  Location: [place] CATH LAB;  Service: Cardiovascular;  Laterality: N/A;    Current Medications: Outpatient Medications Prior to Visit  Medication Sig Dispense Refill   acetaminophen (TYLENOL) 500 MG tablet Take 1 tablet (500 mg total) by mouth every 6 (six) hours as needed for mild pain, fever or headache. 30 tablet 0   Alpha-Lipoic Acid 600 MG CAPS Take 1 capsule by mouth daily.     Alpha-Lipoic Acid 600 MG  TABS Take 600 mg by mouth daily. For neuropathy in legs 90 tablet 3   carboxymethylcellulose (REFRESH PLUS) 0.5 % SOLN Place 1 drop into both eyes 3 (three) times daily as needed (dry/irritated eyes.).     Cholecalciferol (VITAMIN D) 50 MCG (2000 UT) CAPS Take 2,000 Units by mouth every morning.     DULoxetine (CYMBALTA) 30 MG capsule Take 1 capsule (30 mg total) by mouth daily. TO REPLACE PAXIL for mood/ pain 90 capsule 0   ELIQUIS 2.5 MG TABS tablet TAKE  (1)  TABLET TWICE A DAY. (Patient taking differently: Take 2.5 mg by mouth 2 (two) times daily.) 180 tablet 1   feeding supplement (ENSURE ENLIVE / ENSURE PLUS) LIQD Take 237 mLs by mouth 2 (two) times daily between meals.     furosemide (LASIX) 20 MG tablet Take 20 mg by mouth daily.     gabapentin (NEURONTIN) 100 MG capsule Take 100 mg by mouth daily.     isosorbide mononitrate (IMDUR) 30 MG 24 hr tablet Take 1 tablet (30 mg total) by mouth daily. 90 tablet 2   levothyroxine (SYNTHROID) 100 MCG tablet Take 1 tablet (100 mcg total) by mouth daily. (Patient taking differently: Take 100 mcg by mouth every morning.) 30 tablet 5   MELATONIN PO Take 3 mg by mouth at bedtime.     Multiple Vitamins-Minerals (CENTRUM SILVER ULTRA WOMENS PO) Take 1 tablet by mouth every morning.     nitroGLYCERIN (NITROSTAT) 0.4 MG SL tablet Place 1 tablet (0.4 mg total) under the tongue every 5 (five) minutes as needed for chest pain. 25 tablet 2   pantoprazole (PROTONIX) 40 MG tablet TAKE 1 TABLET ONCE DAILY 90 tablet 0   potassium chloride SA (KLOR-CON) 20 MEQ tablet Take 1 tablet (20 mEq total) by mouth 2 (two) times daily. 2 tablet 0   rosuvastatin (CRESTOR) 10 MG tablet TAKE 1 TABLET DAILY 90 tablet 0   metoprolol tartrate (LOPRESSOR) 25 MG tablet Take 0.5 tablets (12.5 mg total) by mouth 2 (two) times daily. HOLD FOR SBP<95 OR HR<65     No facility-administered medications prior to visit.     Allergies:   Sotalol, Tramadol, Actos [pioglitazone], Adhesive  [tape], Atorvastatin, Bactrim [sulfamethoxazole-trimethoprim], Ciprofloxacin, Contrast media [iodinated diagnostic agents], Latex,  Pedi-pre tape spray [wound dressing adhesive], and Sulfa antibiotics   Social History   Socioeconomic History   Marital status: Widowed    Spouse name: Not on file   Number of children: 4   Years of education: 8   Highest education level: 8th grade  Occupational History   Occupation: Retired  Tobacco Use   Smoking status: Never   Smokeless tobacco: Never  Vaping Use   Vaping Use: Never used  Substance and Sexual Activity   Alcohol use: No   Drug use: No   Sexual activity: Never    Birth control/protection: Post-menopausal  Other Topics Concern   Not on file  Social History Narrative   Lives alone - son visits Sundays, daughter visits Saturday   Has an aide Chiquita Loth) 5 days per week   Social Determinants of Radio broadcast assistant Strain: Low Risk    Difficulty of Paying Living Expenses: Not hard at all  Food Insecurity: No Food Insecurity   Worried About Charity fundraiser in the Last Year: Never true   Arboriculturist in the Last Year: Never true  Transportation Needs: No Transportation Needs   Lack of Transportation (Medical): No   Lack of Transportation (Non-Medical): No  Physical Activity: Inactive   Days of Exercise per Week: 0 days   Minutes of Exercise per Session: 0 min  Stress: Stress Concern Present   Feeling of Stress : To some extent  Social Connections: Moderately Isolated   Frequency of Communication with Friends and Family: More than three times a week   Frequency of Social Gatherings with Friends and Family: Twice a week   Attends Religious Services: 1 to 4 times per year   Active Member of [employer] or Organizations: No   Attends Archivist Meetings: Never   Marital Status: Widowed     Family History:  The patient's family history includes Asthma in her brother; Atrial fibrillation in her sister;  Cancer in her daughter; Colon polyps in her daughter; Diabetes in her daughter; Heart attack in her brother and father; Heart disease in her brother; Hyperlipidemia in her sister; Macular degeneration in her sister; Osteoporosis in her sister; Other in her father and mother; Stroke in her mother, sister, and sister; Uterine cancer in her sister.   ROS:   Please see the history of present illness.    ROS All other systems are reviewed and are negative.   PHYSICAL EXAM:   VS:  BP (!) 164/72 (BP Location: Right Arm, Patient Position: Sitting, Cuff Size: Normal)   Pulse (!) 52   Ht '5\' 3"'$  (1.6 m)   Wt 120 lb 12.8 oz (54.8 kg)   SpO2 96%   BMI 21.40 kg/m      General: Alert, oriented x3, no distress, appears elderly and frail.  Although the site does not show evidence of infection or skin erosion, the defibrillator sticks out prominently and puts some pressure on the overlying skin, after she is lost a lot of soft tissue.  There is no evidence of erythema, warmth, fluid collection or dangerous undermining of the skin at this time. Head: no evidence of trauma, PERRL, EOMI, no exophtalmos or lid lag, no myxedema, no xanthelasma; normal ears, nose and oropharynx Neck: normal jugular venous pulsations and no hepatojugular reflux; brisk carotid pulses without delay and no carotid bruits Chest: clear to auscultation, no signs of consolidation by percussion or palpation, normal fremitus, symmetrical and full respiratory excursions Cardiovascular:  normal position and quality of the apical impulse, regular rhythm, normal first and second heart sounds, no murmurs, rubs or gallops Abdomen: no tenderness or distention, no masses by palpation, no abnormal pulsatility or arterial bruits, normal bowel sounds, no hepatosplenomegaly Extremities: no clubbing, cyanosis or edema; 2+ radial, ulnar and brachial pulses bilaterally; 2+ right femoral, posterior tibial and dorsalis pedis pulses; 2+ left femoral, posterior  tibial and dorsalis pedis pulses; no subclavian or femoral bruits Neurological: grossly nonfocal Psych: Normal mood and affect     Wt Readings from Last 3 Encounters:  10/04/20 120 lb 12.8 oz (54.8 kg)  09/27/20 128 lb (58.1 kg)  09/07/20 121 lb 3.2 oz (55 kg)    Studies/Labs Reviewed:   EKG:  EKG is not ordered today.  The most recent tracing from 08/31/2020 shows atrial fibrillation with controlled ventricular response and left bundle branch block.  It was interpreted as showing "sinus or ectopic rhythm".   Recent Labs: 11/14/2019: B Natriuretic Peptide 109.0 03/23/2020: TSH 1.830 08/31/2020: ALT 17; Hemoglobin 11.9; Platelets 203 09/07/2020: BUN 22; Creatinine, Ser 1.01; Magnesium 2.2; Potassium 4.7; Sodium 137   Lipid Panel    Component Value Date/Time   CHOL 164 12/23/2019 2104   CHOL 139 10/23/2018 0947   CHOL 140 07/31/2012 1019   TRIG 61 12/23/2019 2104   TRIG 115 06/07/2015 1111   TRIG 161 (H) 07/31/2012 1019   HDL 66 12/23/2019 2104   HDL 53 10/23/2018 0947   HDL 68 06/07/2015 1111   HDL 43 07/31/2012 1019   CHOLHDL 2.5 12/23/2019 2104   VLDL 12 12/23/2019 2104   LDLCALC 86 12/23/2019 2104   LDLCALC 65 10/23/2018 0947   LDLCALC 65 09/09/2013 0841   LDLCALC 65 07/31/2012 1019     ASSESSMENT:    1. Paroxysmal atrial fibrillation (HCC)   2. Encounter for monitoring amiodarone therapy   3. Catecholaminergic polymorphic ventricular tachycardia ([place])   4. Long term current use of anticoagulant   5. Coronary artery disease involving native coronary artery of native heart with angina pectoris ([place])   6. Essential hypertension   7. Diabetes mellitus type 2, diet-controlled ([place])   8. Stage 3a chronic kidney disease ([place])   9. ICD (implantable cardioverter-defibrillator) in place   10. Hypercholesterolemia   11. Atherosclerosis of aorta ([place])   12. Cognitive decline   13. Intestinal angina ([place])   14. Concern about end of life      PLAN:  In order of  problems listed above:  AFib: Very commonly, this has presented as chest discomfort and shortness of breath and the burden of arrhythmia as well as the prevalence of symptoms improved with chronic treatment with amiodarone.  The amiodarone was stopped in March 2022 due to "severe bradycardia" which I suspect is artifactual, since her defibrillator would not allow her heart rates to be a slow at as was described.  All of the tracings and the ECGs during that admission showed normal heart rates (the minimum recorded was 57 bpm).  It is quite confusing to follow the trail of records and see if the amiodarone was really stopped since it is often listed as an active medication.  It seems probable that the medication was actually discontinued in March and has not been resumed since.  It therefore appears appropriate to resume amiodarone and continue anticoagulation. CHADSVasc 6 (age 38, gender, DM, CAD, hypertension).  Amiodarone will likely provide adequate rate control during atrial fibrillation, but will overlap it with metoprolol until the  slow acting amiodarone can "fully kick in".  If her heart rate decreases to less than 50 bpm we will cut back on the metoprolol or stop it altogether.  We have discussed upgrading her device to a dual-chamber pacemaker in the past.  She has declined this and all other invasive procedures. Amiodarone: We will call her back and tell her to start this medication.  We will just prescribe 200 mg daily, without loading.  The only significant side effect that I can confirm with this medication in this patient was iatrogenic hypothyroidism which appeared to respond reasonably well to levothyroxine supplementation.  Her TSH was last checked in March, before discontinuation of amiodarone.  Liver function tests were normal on 08/31/2020.  We will recheck liver function tests and thyroid function test again in about 3 months. VT: Amiodarone is also indicated to prevent this.  Last episode of  true VT was in April 2021.  She has not had any therapy from her defibrillator since the early 1990s.  She did have a rather lengthy 25 beat nonsustained VT run in September 2020 when we decreased her beta-blocker dose due to bradycardia.  Disopyramide was stopped several years ago due to complaints of dry mouth and orthostatic hypotension.   Eliquis: Dose adjusted for age and worsening renal function.  Her renal function has been fairly volatile.  Although her most recent creatinine was 1.01 and she weighs exactly 120 pounds, I think she should remain on the lower dose of this medication. CAD: Asymptomatic.  Her chest pain most commonly occurs when she has atrial fibrillation has been a remarkably reliable indicator for the arrhythmia..  She has not required revascularization since 2003.  Her last assessment was a cardiac catheterization 2015 that did not show any significant coronary stenoses.  Her physical and cognitive decline makes her a very poor candidate for any further invasive evaluation or treatment. HTN/Orthostatic hypotension: Her blood pressure is actually quite high today.  Orthostatic dizziness has not been a problem recently.  We will have to stop the metoprolol if her heart rate decreases to less than 50 bpm, since this caused substantial fatigue last time she was taking both medications.  Watch for dehydration with her poor p.o. intake. DM: Now controlled without medications after substantial weight loss. CKD 3b: Baseline creatinine now seems to be around 1.1-1.2, corresponding to a GFR of about 45.  Followed by Dr. Theador Hawthorne ICD: Comprehensive interrogation of her device today shows normal function.  She has a Armed forces technical officer implanted in 2016.  Estimated battery longevity is 10.5 years.  She only has 4% ventricular pacing.  She has not had any episodes of sustained or nonsustained ventricular tachycardia since April 2021.  The device really sticks out  due to loss of soft tissue support.  Discussed the findings that might raise concern for possible skin erosion and infection.  Asked her to call about this promptly if it occurs.  If skin rupture is felt to be imminent, she would do better with a preemptive surgical procedure.  At this point I think the skin is still relatively healthy, although rather thin. HLP: She has not had a lipid profile since December 2021.  She is lost 20 pounds since then and is now borderline underweight.  We will recheck a lipid profile together with her thyroid and liver tests in about 3 months. Aortic atherosclerosis: Has a chronic focal dissection of the abdominal aorta.  I do not think this can be incriminated  as a cause of her meal related abdominal pain. Cognitive decline: Short-term memory continues to deteriorate.  She repeated herself a little bit more often than usual during the appointment today.  Her family member, [name] is with her and helps out.  She has a Psychologist, counselling during the day.  The records state that she had "hallucinations" during her hospitalization stay, probably sundowning/delirium Intestinal angina versus bowel hypersensitivity: Her symptoms of intestinal angina, food aversion and gradual weight loss are quite compelling, but the imaging study showed that all 3 of the major mesenteric arterial supply vessels are widely patent.  It is very difficult to prove or disprove the proposed diagnosis of microvascular intestinal angina.  Thankfully, her most recent albumin was improving at 3.9 and she does not have overt signs of malnutrition.  Recent vitamin B12 level was high.  Recent iron studies showed normal ferritin and transferrin saturation values.  She is also not a candidate for aggressive therapies. Discussions about end-of -life care: Had a discussion with Jaqueta and her family member Opal Sidles about the fact that the defibrillator therapies may become undesirable if her physical and cognitive  decline continues.  At some point, she should have a discussion with her family about long-term wishes.  She has already filled out DNR papers.  It is likely that quality of life will deteriorate in the future to a point that she would not want defibrillator therapies anymore.  If and when that occurs, we will be able to simply turn off tachycardia therapies.    Medication Adjustments/Labs and Tests Ordered: Current medicines are reviewed at length with the patient today.  Concerns regarding medicines are outlined above.  Medication changes, Labs and Tests ordered today are listed in the Patient Instructions below. Patient Instructions  Medication Instructions:   Start amiodarone 200 mg once daily Please call us if your heart rate is less than 50 bpm.  *If you need a refill on your cardiac medications before your next appointment, please call your pharmacy*   Lab Work: None ordered If you have labs (blood work) drawn today and your tests are completely normal, you will receive your results only by: [place] (if you have MyChart) OR A paper copy in the mail If you have any lab test that is abnormal or we need to change your treatment, we will call you to review the results.   Testing/Procedures: None ordered   Follow-Up: At [facility], you and your health needs are our priority.  As part of our continuing mission to provide you with exceptional heart care, we have created designated Provider Care Teams.  These Care Teams include your primary Cardiologist (physician) and Advanced Practice Providers (APPs -  Physician Assistants and Nurse Practitioners) who all work together to provide you with the care you need, when you need it.  We recommend signing up for the patient portal called "MyChart".  Sign up information is provided on this After Visit Summary.  MyChart is used to connect with patients for Virtual Visits (Telemedicine).  Patients are able to view lab/test results,  encounter notes, upcoming appointments, etc.  Non-urgent messages can be sent to your provider as well.   To learn more about what you can do with MyChart, go to [email or web address].    Your next appointment:   3 month(s)  The format for your next appointment:   In Person  Provider:   Sanda Klein, MD     Signed, Sanda Klein, MD  10/04/2020 2:38 PM  [facility] [address], [place], [place]  [address] Phone: [phone]; Fax: [phone]

## 2020-10-05 ENCOUNTER — Encounter: Payer: Self-pay | Admitting: Family Medicine

## 2020-10-05 ENCOUNTER — Ambulatory Visit (INDEPENDENT_AMBULATORY_CARE_PROVIDER_SITE_OTHER): Payer: Medicare Other | Admitting: Family Medicine

## 2020-10-05 VITALS — BP 119/68 | HR 59 | Temp 98.0°F | Ht 63.0 in | Wt 120.8 lb

## 2020-10-05 DIAGNOSIS — Z23 Encounter for immunization: Secondary | ICD-10-CM | POA: Diagnosis not present

## 2020-10-05 DIAGNOSIS — E1140 Type 2 diabetes mellitus with diabetic neuropathy, unspecified: Secondary | ICD-10-CM | POA: Diagnosis not present

## 2020-10-05 DIAGNOSIS — K529 Noninfective gastroenteritis and colitis, unspecified: Secondary | ICD-10-CM

## 2020-10-05 NOTE — Progress Notes (Signed)
Subjective: CC: Follow-up diarrhea, neuropathy PCP: [name], DO BE:[id number] [name] is a 87 y.o. female presenting to clinic today for:  1.  Chronic diarrhea She reports that the diarrhea has gotten somewhat better with the Imodium.  However, she wants to know if she might consider taking this every other day rather than daily as she did feel like she got slightly constipated when taking it daily.  No blood in stool.  2.  Neuropathy Reports being newly started on gabapentin 100 mg at bedtime.  She has had some episodes where she feels a little jittery in the morning but denies any falls or other concerning symptoms or signs since being on this.  This was started about a week ago.  Too early to tell if this is going to be super effective for neuropathy but she certainly has not had any worsening.  She is also taking alpha lipoic acid   ROS: Per HPI  Allergies  Allergen Reactions   Sotalol Other (See Comments)    Torsades    Tramadol Other (See Comments)    Hallucination, bad-dream   Actos [Pioglitazone] Swelling   Adhesive [Tape] Other (See Comments)    Causes sores   Atorvastatin Other (See Comments)    myalgia   Bactrim [Sulfamethoxazole-Trimethoprim] Itching and Rash    Itchy rash   Ciprofloxacin Nausea Only   Contrast Media [Iodinated Diagnostic Agents] Other (See Comments)    Headache (no action/pre-med required)   Latex Rash   Pedi-Pre Tape Spray [Wound Dressing Adhesive] Other (See Comments)    Causes sores   Sulfa Antibiotics Other (See Comments)    Causes sores   Past Medical History:  Diagnosis Date   AICD (automatic cardioverter/defibrillator) present    Allergy    SESONAL   Aneurysm of infrarenal abdominal aorta (HCC)    Anxiety    ARTHRITIS    Arthritis    Atrial fibrillation (HCC)    CAD (coronary artery disease)    a. history of cardiac arrest 1993;  b. s/p LAD/LCX stenting in 2003;  c. 07/2011 Cath: LM nl, LAD patent stent, D1 80ost, LCX  patent stent, RCA min irregs;  d. 04/2012 MV: EF 66%, no ishcemia;  e. 12/2013 Echo: Ef 55%, no rwma, Gr 1 DD, triv AI/MR, mildly dil LA;  f. 12/2013 Lexi MV: intermediate risk - apical ischemia and inf/infsept fixed defect, ? artifactual.   CAD in native artery 12/31/2013   Previous stents to LAD and Ramus patent on cath today 12/31/13 also There is severe disease in the ostial first diagonal which is unchanged from most recent cardiac catheterization. The right coronary artery could not be engaged selectively but nonselective angiography showed no significant disease in the proximal and midsegment.       Cataract    DENIES   Chronic back pain    Chronic sinus bradycardia    CKD (chronic kidney disease), stage III (HCC)    Clotting disorder ([place])    DVT   COLITIS 12/02/2007   Qualifier: Diagnosis of  By: [name], [name]     Diabetes mellitus without complication ([place])    DENIES   Diverticulosis of colon (without mention of hemorrhage) 2007   Colonoscopy    Esophageal stricture    a. 2012 s/p dil.   Esophagitis, unspecified    a. 2012 EGD   EXTERNAL HEMORRHOIDS    GERD (gastroesophageal reflux disease)    omeprazole   H/O hiatal hernia  Hiatal hernia    a. 2012 EGD.   History of DVT in the past, not on Coumadin now    left  leg   HYPERCHOLESTEROLEMIA    Hypertension    ICD (implantable cardiac defibrillator) in place    a. s/p initial ICD in 1993 in setting of cardiac arrest;  b. 01/2007 gen change: Guidant T135 Vitality DS VR single lead ICD.   Macular degeneration    gets injecton in eye every 5 weeks- last injection - 05/03/2013    Myocardial infarction [facility]) 1993   Near syncope 05/22/2015   Nephrolithiasis, just saw Dr [name]    Orthostatic hypotension    Peripheral vascular disease ([place])    ???   RECTAL BLEEDING 12/03/2007   Qualifier: Diagnosis of  By: [name]    Unspecified gastritis and gastroduodenitis without mention of hemorrhage     a. 2003 EGD->not noted on 2012 EGD.   Unstable angina, neg MI, cath stable.maybe GI 12/30/2013    Current Outpatient Medications:    acetaminophen (TYLENOL) 500 MG tablet, Take 1 tablet (500 mg total) by mouth every 6 (six) hours as needed for mild pain, fever or headache., Disp: 30 tablet, Rfl: 0   Alpha-Lipoic Acid 600 MG TABS, Take 600 mg by mouth daily. For neuropathy in legs, Disp: 90 tablet, Rfl: 3   amiodarone (PACERONE) 200 MG tablet, Take 1 tablet (200 mg total) by mouth daily., Disp: 90 tablet, Rfl: 3   carboxymethylcellulose (REFRESH PLUS) 0.5 % SOLN, Place 1 drop into both eyes 3 (three) times daily as needed (dry/irritated eyes.)., Disp: , Rfl:    Cholecalciferol (VITAMIN D) 50 MCG (2000 UT) CAPS, Take 2,000 Units by mouth every morning., Disp: , Rfl:    DULoxetine (CYMBALTA) 30 MG capsule, Take 1 capsule (30 mg total) by mouth daily. TO REPLACE PAXIL for mood/ pain, Disp: 90 capsule, Rfl: 0   ELIQUIS 2.5 MG TABS tablet, TAKE  (1)  TABLET TWICE A DAY. (Patient taking differently: Take 2.5 mg by mouth 2 (two) times daily.), Disp: 180 tablet, Rfl: 1   feeding supplement (ENSURE ENLIVE / ENSURE PLUS) LIQD, Take 237 mLs by mouth 2 (two) times daily between meals., Disp: , Rfl:    furosemide (LASIX) 20 MG tablet, Take 20 mg by mouth 3 (three) times a week., Disp: , Rfl:    gabapentin (NEURONTIN) 100 MG capsule, Take 100 mg by mouth daily., Disp: , Rfl:    isosorbide mononitrate (IMDUR) 30 MG 24 hr tablet, Take 1 tablet (30 mg total) by mouth daily., Disp: 90 tablet, Rfl: 2   levothyroxine (SYNTHROID) 100 MCG tablet, Take 1 tablet (100 mcg total) by mouth daily. (Patient taking differently: Take 100 mcg by mouth every morning.), Disp: 30 tablet, Rfl: 5   MELATONIN PO, Take 3 mg by mouth at bedtime., Disp: , Rfl:    metoprolol tartrate (LOPRESSOR) 25 MG tablet, Take 0.5 tablets (12.5 mg total) by mouth 2 (two) times daily. HOLD FOR SBP<95 OR HR<65, Disp: 90 tablet, Rfl: 1   Multiple  Vitamins-Minerals (CENTRUM SILVER ULTRA WOMENS PO), Take 1 tablet by mouth every morning., Disp: , Rfl:    nitroGLYCERIN (NITROSTAT) 0.4 MG SL tablet, Place 1 tablet (0.4 mg total) under the tongue every 5 (five) minutes as needed for chest pain., Disp: 25 tablet, Rfl: 2   pantoprazole (PROTONIX) 40 MG tablet, TAKE 1 TABLET ONCE DAILY, Disp: 90 tablet, Rfl: 0   potassium chloride SA (KLOR-CON) 20 MEQ  tablet, Take 20 mEq by mouth 3 (three) times a week., Disp: , Rfl:    rosuvastatin (CRESTOR) 10 MG tablet, TAKE 1 TABLET DAILY, Disp: 90 tablet, Rfl: 0 Social History   Socioeconomic History   Marital status: Widowed    Spouse name: Not on file   Number of children: 4   Years of education: 8   Highest education level: 8th grade  Occupational History   Occupation: Retired  Tobacco Use   Smoking status: Never   Smokeless tobacco: Never  Vaping Use   Vaping Use: Never used  Substance and Sexual Activity   Alcohol use: No   Drug use: No   Sexual activity: Never    Birth control/protection: Post-menopausal  Other Topics Concern   Not on file  Social History Narrative   Lives alone - son visits Sundays, daughter visits Saturday   Has an aide Chiquita Loth) 5 days per week   Social Determinants of Radio broadcast assistant Strain: Low Risk    Difficulty of Paying Living Expenses: Not hard at all  Food Insecurity: No Food Insecurity   Worried About Charity fundraiser in the Last Year: Never true   Arboriculturist in the Last Year: Never true  Transportation Needs: No Transportation Needs   Lack of Transportation (Medical): No   Lack of Transportation (Non-Medical): No  Physical Activity: Inactive   Days of Exercise per Week: 0 days   Minutes of Exercise per Session: 0 min  Stress: Stress Concern Present   Feeling of Stress : To some extent  Social Connections: Moderately Isolated   Frequency of Communication with Friends and Family: More than three times a week   Frequency  of Social Gatherings with Friends and Family: Twice a week   Attends Religious Services: 1 to 4 times per year   Active Member of [employer] or Organizations: No   Attends Archivist Meetings: Never   Marital Status: Widowed  Human resources officer Violence: Not At Risk   Fear of Current or Ex-Partner: No   Emotionally Abused: No   Physically Abused: No   Sexually Abused: No   Family History  Problem Relation Age of Onset   Stroke Mother    Other Mother        brain tumor   Other Father        MI   Heart attack Father    Stroke Sister    Macular degeneration Sister    Diabetes Daughter    Cancer Daughter        ovarian   Colon polyps Daughter    Atrial fibrillation Sister    Hyperlipidemia Sister    Osteoporosis Sister    Stroke Sister    Uterine cancer Sister    Heart attack Brother    Heart disease Brother    Asthma Brother    Breast cancer Neg Hx     Objective: Office vital signs reviewed. BP 119/68   Pulse (!) 59   Temp 98 F (36.7 C)   Ht '5\' 3"'$  (1.6 m)   Wt 120 lb 12.8 oz (54.8 kg)   SpO2 98%   BMI 21.40 kg/m   Physical Examination:  General: Awake, alert, chronically ill-appearing female, No acute distress HEENT: Normal, sclera white Cardio: Bradycardic with seemingly regular rhythm.  Pacemaker in place on the left side of her chest, S1S2 heard, no murmurs appreciated Pulm: clear to auscultation bilaterally, no wheezes, rhonchi or rales; normal work  of breathing on room air  Assessment/ Plan: 87 y.o. female   Type 2 diabetes mellitus with diabetic neuropathy, without long-term current use of insulin (HCC)  Chronic diarrhea  Encounter for immunization - Plan: Flu Vaccine MDCK QUAD PF, Pneumococcal conjugate vaccine 20-valent (Prevnar 20)  Newly started on gabapentin for neuropathy.  Not having any exacerbation of swelling which is reassuring.  Discussed that this can cause some excessive daytime sedation.  She did mention feeling a little Trembley in  the mornings I would like him to keep a close eye on that  Her chronic diarrhea seems to be better with use of Imodium.  I think that given that she is at high risk of dehydration or electrolyte imbalance utilizing the Imodium every other day should be safe.  We discussed the drying effect of that medication I did use with caution  Both influenza and pneumococcal vaccinations were administered today.  No orders of the defined types were placed in this encounter.  No orders of the defined types were placed in this encounter.    [name], DO [place] [phone]

## 2020-10-07 DIAGNOSIS — I4891 Unspecified atrial fibrillation: Secondary | ICD-10-CM | POA: Diagnosis not present

## 2020-10-07 DIAGNOSIS — L97528 Non-pressure chronic ulcer of other part of left foot with other specified severity: Secondary | ICD-10-CM | POA: Diagnosis not present

## 2020-10-07 DIAGNOSIS — L03116 Cellulitis of left lower limb: Secondary | ICD-10-CM | POA: Diagnosis not present

## 2020-10-07 DIAGNOSIS — E11621 Type 2 diabetes mellitus with foot ulcer: Secondary | ICD-10-CM | POA: Diagnosis not present

## 2020-10-07 DIAGNOSIS — Z95810 Presence of automatic (implantable) cardiac defibrillator: Secondary | ICD-10-CM | POA: Diagnosis not present

## 2020-10-07 DIAGNOSIS — I25119 Atherosclerotic heart disease of native coronary artery with unspecified angina pectoris: Secondary | ICD-10-CM | POA: Diagnosis not present

## 2020-10-14 DIAGNOSIS — L03116 Cellulitis of left lower limb: Secondary | ICD-10-CM | POA: Diagnosis not present

## 2020-10-14 DIAGNOSIS — L97528 Non-pressure chronic ulcer of other part of left foot with other specified severity: Secondary | ICD-10-CM | POA: Diagnosis not present

## 2020-10-14 DIAGNOSIS — Z95810 Presence of automatic (implantable) cardiac defibrillator: Secondary | ICD-10-CM | POA: Diagnosis not present

## 2020-10-14 DIAGNOSIS — I4891 Unspecified atrial fibrillation: Secondary | ICD-10-CM | POA: Diagnosis not present

## 2020-10-14 DIAGNOSIS — E11621 Type 2 diabetes mellitus with foot ulcer: Secondary | ICD-10-CM | POA: Diagnosis not present

## 2020-10-14 DIAGNOSIS — I25119 Atherosclerotic heart disease of native coronary artery with unspecified angina pectoris: Secondary | ICD-10-CM | POA: Diagnosis not present

## 2020-10-15 DIAGNOSIS — I10 Essential (primary) hypertension: Secondary | ICD-10-CM

## 2020-10-15 DIAGNOSIS — E785 Hyperlipidemia, unspecified: Secondary | ICD-10-CM

## 2020-10-15 DIAGNOSIS — N1831 Chronic kidney disease, stage 3a: Secondary | ICD-10-CM | POA: Diagnosis not present

## 2020-10-15 DIAGNOSIS — I480 Paroxysmal atrial fibrillation: Secondary | ICD-10-CM | POA: Diagnosis not present

## 2020-10-15 DIAGNOSIS — F339 Major depressive disorder, recurrent, unspecified: Secondary | ICD-10-CM | POA: Diagnosis not present

## 2020-10-15 DIAGNOSIS — I2510 Atherosclerotic heart disease of native coronary artery without angina pectoris: Secondary | ICD-10-CM

## 2020-10-15 DIAGNOSIS — E119 Type 2 diabetes mellitus without complications: Secondary | ICD-10-CM | POA: Diagnosis not present

## 2020-10-15 DIAGNOSIS — E1169 Type 2 diabetes mellitus with other specified complication: Secondary | ICD-10-CM | POA: Diagnosis not present

## 2020-10-19 ENCOUNTER — Other Ambulatory Visit: Payer: Self-pay | Admitting: Family Medicine

## 2020-10-20 ENCOUNTER — Ambulatory Visit (INDEPENDENT_AMBULATORY_CARE_PROVIDER_SITE_OTHER): Payer: Medicare Other

## 2020-10-20 DIAGNOSIS — I480 Paroxysmal atrial fibrillation: Secondary | ICD-10-CM | POA: Diagnosis not present

## 2020-10-20 LAB — CUP PACEART REMOTE DEVICE CHECK
Battery Remaining Longevity: 120 mo
Battery Remaining Percentage: 100 %
Brady Statistic RV Percent Paced: 0 %
Date Time Interrogation Session: 20221005042000
HighPow Impedance: 39 Ohm
Implantable Lead Implant Date: 20090120
Implantable Lead Location: 753860
Implantable Lead Model: 157
Implantable Lead Serial Number: 138237
Implantable Pulse Generator Implant Date: 20160126
Lead Channel Impedance Value: 533 Ohm
Lead Channel Pacing Threshold Amplitude: 0.7 V
Lead Channel Pacing Threshold Pulse Width: 0.5 ms
Lead Channel Setting Pacing Amplitude: 2.2 V
Lead Channel Setting Pacing Pulse Width: 0.5 ms
Lead Channel Setting Sensing Sensitivity: 0.6 mV
Pulse Gen Serial Number: 191956

## 2020-10-28 NOTE — Progress Notes (Signed)
Remote ICD transmission.   

## 2020-11-05 ENCOUNTER — Ambulatory Visit (INDEPENDENT_AMBULATORY_CARE_PROVIDER_SITE_OTHER): Payer: Medicare Other | Admitting: Family Medicine

## 2020-11-05 ENCOUNTER — Telehealth: Payer: Self-pay | Admitting: Family Medicine

## 2020-11-05 ENCOUNTER — Encounter: Payer: Self-pay | Admitting: Family Medicine

## 2020-11-05 ENCOUNTER — Other Ambulatory Visit: Payer: Self-pay

## 2020-11-05 VITALS — BP 120/60 | HR 81 | Temp 97.1°F | Ht 63.0 in | Wt 127.0 lb

## 2020-11-05 DIAGNOSIS — E039 Hypothyroidism, unspecified: Secondary | ICD-10-CM | POA: Diagnosis not present

## 2020-11-05 DIAGNOSIS — N1831 Chronic kidney disease, stage 3a: Secondary | ICD-10-CM

## 2020-11-05 DIAGNOSIS — G8929 Other chronic pain: Secondary | ICD-10-CM | POA: Diagnosis not present

## 2020-11-05 DIAGNOSIS — I480 Paroxysmal atrial fibrillation: Secondary | ICD-10-CM

## 2020-11-05 DIAGNOSIS — M25561 Pain in right knee: Secondary | ICD-10-CM | POA: Diagnosis not present

## 2020-11-05 MED ORDER — GABAPENTIN 100 MG PO CAPS: 100.0000 mg | ORAL_CAPSULE | Freq: Every evening | ORAL | 3 refills | Status: DC | PRN

## 2020-11-05 MED ORDER — METHYLPREDNISOLONE ACETATE 40 MG/ML IJ SUSP
40.0000 mg | Freq: Once | INTRAMUSCULAR | Status: AC
  Administered 2020-11-05: 40 mg via INTRAMUSCULAR

## 2020-11-05 NOTE — Telephone Encounter (Signed)
Debra aware med has been refilled at appt today

## 2020-11-05 NOTE — Progress Notes (Signed)
Subjective: [id number] right knee pain PCP: [name], DO [id number] [name] is a 87 y.o. female presenting to clinic today for:  1. Chronic right knee pain Here for corticosteroid injection of right knee. Not a candidate for visco-supplementation.  Too high risk for knee replacement.  Orthopedist has recommended ongoing chronic knee injections for maintenance.  Right knee has been causing some pain.  Last corticosteroid injection was performed in July by her orthopedist.  She will be going to the beach soon and wants to make sure that she is able to get around okay.  Additionally, the gabapentin 100 mg is not totally sufficient and she wonders if perhaps she might be able to go up on this a little bit  2.  Atrial fibrillation/CKD 3 Compliant with Eliquis 2.5 mg twice daily.  Amiodarone recently reinitiated.  No reports of any concerns but feels like her heart has been skipping some beats.  No tachycardia.  Renal function monitored by her specialist.   ROS: Per HPI  Allergies  Allergen Reactions   Sotalol Other (See Comments)    Torsades    Tramadol Other (See Comments)    Hallucination, bad-dream   Actos [Pioglitazone] Swelling   Adhesive [Tape] Other (See Comments)    Causes sores   Atorvastatin Other (See Comments)    myalgia   Bactrim [Sulfamethoxazole-Trimethoprim] Itching and Rash    Itchy rash   Ciprofloxacin Nausea Only   Contrast Media [Iodinated Diagnostic Agents] Other (See Comments)    Headache (no action/pre-med required)   Latex Rash   Pedi-Pre Tape Spray [Wound Dressing Adhesive] Other (See Comments)    Causes sores   Sulfa Antibiotics Other (See Comments)    Causes sores   Past Medical History:  Diagnosis Date   AICD (automatic cardioverter/defibrillator) present    Allergy    SESONAL   Aneurysm of infrarenal abdominal aorta (HCC)    Anxiety    ARTHRITIS    Arthritis    Atrial fibrillation ([place])    CAD (coronary artery disease)    a.  history of cardiac arrest 1993;  b. s/p LAD/LCX stenting in 2003;  c. 07/2011 Cath: LM nl, LAD patent stent, D1 80ost, LCX patent stent, RCA min irregs;  d. 04/2012 MV: EF 66%, no ishcemia;  e. 12/2013 Echo: Ef 55%, no rwma, Gr 1 DD, triv AI/MR, mildly dil LA;  f. 12/2013 Lexi MV: intermediate risk - apical ischemia and inf/infsept fixed defect, ? artifactual.   CAD in native artery 12/31/2013   Previous stents to LAD and Ramus patent on cath today 12/31/13 also There is severe disease in the ostial first diagonal which is unchanged from most recent cardiac catheterization. The right coronary artery could not be engaged selectively but nonselective angiography showed no significant disease in the proximal and midsegment.       Cataract    DENIES   Chronic back pain    Chronic sinus bradycardia    CKD (chronic kidney disease), stage III (HCC)    Clotting disorder ([place])    DVT   COLITIS 12/02/2007   Qualifier: Diagnosis of  By: [name], [name]     Diabetes mellitus without complication ([place])    DENIES   Diverticulosis of colon (without mention of hemorrhage) 2007   Colonoscopy    Esophageal stricture    a. 2012 s/p dil.   Esophagitis, unspecified    a. 2012 EGD   EXTERNAL HEMORRHOIDS    GERD (gastroesophageal reflux disease)  omeprazole   H/O hiatal hernia    Hiatal hernia    a. 2012 EGD.   History of DVT in the past, not on Coumadin now    left  leg   HYPERCHOLESTEROLEMIA    Hypertension    ICD (implantable cardiac defibrillator) in place    a. s/p initial ICD in 1993 in setting of cardiac arrest;  b. 01/2007 gen change: Guidant T135 Vitality DS VR single lead ICD.   Macular degeneration    gets injecton in eye every 5 weeks- last injection - 05/03/2013    Myocardial infarction [facility]) 1993   Near syncope 05/22/2015   Nephrolithiasis, just saw Dr [name]    Orthostatic hypotension    Peripheral vascular disease ([place])    ???   RECTAL BLEEDING 12/03/2007   Qualifier:  Diagnosis of  By: [name]    Unspecified gastritis and gastroduodenitis without mention of hemorrhage    a. 2003 EGD->not noted on 2012 EGD.   Unstable angina, neg MI, cath stable.maybe GI 12/30/2013    Current Outpatient Medications:    acetaminophen (TYLENOL) 500 MG tablet, Take 1 tablet (500 mg total) by mouth every 6 (six) hours as needed for mild pain, fever or headache., Disp: 30 tablet, Rfl: 0   Alpha-Lipoic Acid 600 MG TABS, Take 600 mg by mouth daily. For neuropathy in legs, Disp: 90 tablet, Rfl: 3   amiodarone (PACERONE) 200 MG tablet, Take 1 tablet (200 mg total) by mouth daily., Disp: 90 tablet, Rfl: 3   carboxymethylcellulose (REFRESH PLUS) 0.5 % SOLN, Place 1 drop into both eyes 3 (three) times daily as needed (dry/irritated eyes.)., Disp: , Rfl:    Cholecalciferol (VITAMIN D) 50 MCG (2000 UT) CAPS, Take 2,000 Units by mouth every morning., Disp: , Rfl:    DULoxetine (CYMBALTA) 30 MG capsule, Take 1 capsule (30 mg total) by mouth daily. TO REPLACE PAXIL for mood/ pain, Disp: 90 capsule, Rfl: 0   ELIQUIS 2.5 MG TABS tablet, TAKE  (1)  TABLET TWICE A DAY. (Patient taking differently: Take 2.5 mg by mouth 2 (two) times daily.), Disp: 180 tablet, Rfl: 1   feeding supplement (ENSURE ENLIVE / ENSURE PLUS) LIQD, Take 237 mLs by mouth 2 (two) times daily between meals., Disp: , Rfl:    furosemide (LASIX) 20 MG tablet, Take 20 mg by mouth 3 (three) times a week., Disp: , Rfl:    gabapentin (NEURONTIN) 100 MG capsule, Take 100 mg by mouth daily., Disp: , Rfl:    isosorbide mononitrate (IMDUR) 30 MG 24 hr tablet, Take 1 tablet (30 mg total) by mouth daily., Disp: 90 tablet, Rfl: 2   levothyroxine (SYNTHROID) 100 MCG tablet, Take 1 tablet (100 mcg total) by mouth every morning., Disp: 90 tablet, Rfl: 1   MELATONIN PO, Take 3 mg by mouth at bedtime., Disp: , Rfl:    metoprolol tartrate (LOPRESSOR) 25 MG tablet, Take 0.5 tablets (12.5 mg total) by mouth 2 (two) times daily.  HOLD FOR SBP<95 OR HR<65, Disp: 90 tablet, Rfl: 1   Multiple Vitamins-Minerals (CENTRUM SILVER ULTRA WOMENS PO), Take 1 tablet by mouth every morning., Disp: , Rfl:    nitroGLYCERIN (NITROSTAT) 0.4 MG SL tablet, Place 1 tablet (0.4 mg total) under the tongue every 5 (five) minutes as needed for chest pain., Disp: 25 tablet, Rfl: 2   pantoprazole (PROTONIX) 40 MG tablet, TAKE 1 TABLET ONCE DAILY, Disp: 90 tablet, Rfl: 0   potassium chloride SA (KLOR-CON) 20 MEQ  tablet, Take 20 mEq by mouth 3 (three) times a week., Disp: , Rfl:    rosuvastatin (CRESTOR) 10 MG tablet, TAKE 1 TABLET DAILY, Disp: 90 tablet, Rfl: 0 Social History   Socioeconomic History   Marital status: Widowed    Spouse name: Not on file   Number of children: 4   Years of education: 8   Highest education level: 8th grade  Occupational History   Occupation: Retired  Tobacco Use   Smoking status: Never   Smokeless tobacco: Never  Vaping Use   Vaping Use: Never used  Substance and Sexual Activity   Alcohol use: No   Drug use: No   Sexual activity: Never    Birth control/protection: Post-menopausal  Other Topics Concern   Not on file  Social History Narrative   Lives alone - son visits Sundays, daughter visits Saturday   Has an aide Chiquita Loth) 5 days per week   Social Determinants of Radio broadcast assistant Strain: Low Risk    Difficulty of Paying Living Expenses: Not hard at all  Food Insecurity: No Food Insecurity   Worried About Charity fundraiser in the Last Year: Never true   Arboriculturist in the Last Year: Never true  Transportation Needs: No Transportation Needs   Lack of Transportation (Medical): No   Lack of Transportation (Non-Medical): No  Physical Activity: Inactive   Days of Exercise per Week: 0 days   Minutes of Exercise per Session: 0 min  Stress: Stress Concern Present   Feeling of Stress : To some extent  Social Connections: Moderately Isolated   Frequency of Communication with  Friends and Family: More than three times a week   Frequency of Social Gatherings with Friends and Family: Twice a week   Attends Religious Services: 1 to 4 times per year   Active Member of [employer] or Organizations: No   Attends Archivist Meetings: Never   Marital Status: Widowed  Human resources officer Violence: Not At Risk   Fear of Current or Ex-Partner: No   Emotionally Abused: No   Physically Abused: No   Sexually Abused: No   Family History  Problem Relation Age of Onset   Stroke Mother    Other Mother        brain tumor   Other Father        MI   Heart attack Father    Stroke Sister    Macular degeneration Sister    Diabetes Daughter    Cancer Daughter        ovarian   Colon polyps Daughter    Atrial fibrillation Sister    Hyperlipidemia Sister    Osteoporosis Sister    Stroke Sister    Uterine cancer Sister    Heart attack Brother    Heart disease Brother    Asthma Brother    Breast cancer Neg Hx     Objective: Office vital signs reviewed. BP 120/60   Pulse 81   Temp (!) 97.1 F (36.2 C)   Ht '5\' 3"'$  (1.6 m)   Wt 127 lb (57.6 kg)   SpO2 91%   BMI 22.50 kg/m   Physical Examination:  General: Awake, alert, No acute distress Cardio: irregularly irregular. MSK: right knee with obvious osteoarthritic changes.  No gross joint effusion or erythema. Ambulating with use of walker.  JOINT INJECTION:  Patient denies allergy to antiseptics (including iodine) and anesthetics.  Patient DOES have a h/o diabetes and  use of blood thinners/ antiplatelets.  Last knee injection 05/03/20  Patient was given informed consent and a signed copy has been placed in the chart. Appropriate time out was taken. Area prepped and draped in usual sterile fashion. Anatomic landmarks were identified and injection site was marked.  Ethyl chloride spray was used to numb the area and 1 cc of methylprednisolone 40 mg/ml plus  3 cc of 1% lidocaine without epinephrine was injected into the  right knee using a(n) anteriorlateral approach. The patient tolerated the procedure well and there were no immediate complications. Estimated blood loss is less than 1 cc.  Post procedure instructions were reviewed and handout outlining these instructions were provided to patient.  Assessment/ Plan: 87 y.o. female   Chronic pain of right knee - Plan: methylPREDNISolone acetate (DEPO-MEDROL) injection 40 mg  Chronic kidney disease (CKD) stage G3a/A1, moderately decreased glomerular filtration rate (GFR) between 45-59 mL/min/1.73 square meter and albuminuria creatinine ratio less than 30 mg/g (HCC) - Plan: Renal Function Panel  Paroxysmal atrial fibrillation, chads2 Vasc2 score of 5, on eliquis - Plan: CBC  Treat with corticosteroid injection.  She tolerated procedure without difficulty.  No immediate complications.  May follow-up in 3 to 4 months for next corticosteroid injection.  Gabapentin dose increased.  Caution sedation.  Monitor renal function, amiodarone reinitiated.  Check CBC given chronic anticoagulation  No orders of the defined types were placed in this encounter.  No orders of the defined types were placed in this encounter.    [name], DO [place] [phone]

## 2020-11-06 LAB — RENAL FUNCTION PANEL
Albumin: 4 g/dL (ref 3.6–4.6)
BUN/Creatinine Ratio: 19 (ref 12–28)
BUN: 25 mg/dL (ref 8–27)
CO2: 22 mmol/L (ref 20–29)
Calcium: 9.4 mg/dL (ref 8.7–10.3)
Chloride: 106 mmol/L (ref 96–106)
Creatinine, Ser: 1.32 mg/dL — ABNORMAL HIGH (ref 0.57–1.00)
Glucose: 124 mg/dL — ABNORMAL HIGH (ref 70–99)
Phosphorus: 3.7 mg/dL (ref 3.0–4.3)
Potassium: 5 mmol/L (ref 3.5–5.2)
Sodium: 140 mmol/L (ref 134–144)
eGFR: 39 mL/min/{1.73_m2} — ABNORMAL LOW (ref >59)

## 2020-11-06 LAB — CBC
Hematocrit: 34.9 % (ref 34.0–46.6)
Hemoglobin: 11.5 g/dL (ref 11.1–15.9)
MCH: 30.3 pg (ref 26.6–33.0)
MCHC: 33 g/dL (ref 31.5–35.7)
MCV: 92 fL (ref 79–97)
Platelets: 217 10*3/uL (ref 150–450)
RBC: 3.8 x10E6/uL (ref 3.77–5.28)
RDW: 13.8 % (ref 11.7–15.4)
WBC: 4.7 10*3/uL (ref 3.4–10.8)

## 2020-11-06 LAB — TSH: TSH: 2.13 u[IU]/mL (ref 0.450–4.500)

## 2020-11-06 LAB — T4, FREE: Free T4: 1.64 ng/dL (ref 0.82–1.77)

## 2020-11-12 ENCOUNTER — Telehealth: Payer: Medicare Other

## 2020-11-15 ENCOUNTER — Ambulatory Visit: Payer: Medicare Other | Admitting: Family Medicine

## 2020-11-16 DIAGNOSIS — I70203 Unspecified atherosclerosis of native arteries of extremities, bilateral legs: Secondary | ICD-10-CM | POA: Diagnosis not present

## 2020-11-16 DIAGNOSIS — M79676 Pain in unspecified toe(s): Secondary | ICD-10-CM | POA: Diagnosis not present

## 2020-11-16 DIAGNOSIS — L84 Corns and callosities: Secondary | ICD-10-CM | POA: Diagnosis not present

## 2020-11-16 DIAGNOSIS — B351 Tinea unguium: Secondary | ICD-10-CM | POA: Diagnosis not present

## 2020-11-17 ENCOUNTER — Ambulatory Visit (INDEPENDENT_AMBULATORY_CARE_PROVIDER_SITE_OTHER): Payer: Medicare Other | Admitting: Licensed Clinical Social Worker

## 2020-11-17 DIAGNOSIS — E785 Hyperlipidemia, unspecified: Secondary | ICD-10-CM

## 2020-11-17 DIAGNOSIS — E1169 Type 2 diabetes mellitus with other specified complication: Secondary | ICD-10-CM

## 2020-11-17 DIAGNOSIS — F339 Major depressive disorder, recurrent, unspecified: Secondary | ICD-10-CM

## 2020-11-17 DIAGNOSIS — K580 Irritable bowel syndrome with diarrhea: Secondary | ICD-10-CM

## 2020-11-17 DIAGNOSIS — Z95810 Presence of automatic (implantable) cardiac defibrillator: Secondary | ICD-10-CM

## 2020-11-17 DIAGNOSIS — E1140 Type 2 diabetes mellitus with diabetic neuropathy, unspecified: Secondary | ICD-10-CM

## 2020-11-17 DIAGNOSIS — R1319 Other dysphagia: Secondary | ICD-10-CM

## 2020-11-17 DIAGNOSIS — I10 Essential (primary) hypertension: Secondary | ICD-10-CM

## 2020-11-17 DIAGNOSIS — I2510 Atherosclerotic heart disease of native coronary artery without angina pectoris: Secondary | ICD-10-CM

## 2020-11-17 DIAGNOSIS — N1831 Chronic kidney disease, stage 3a: Secondary | ICD-10-CM

## 2020-11-17 DIAGNOSIS — R001 Bradycardia, unspecified: Secondary | ICD-10-CM

## 2020-11-17 DIAGNOSIS — I480 Paroxysmal atrial fibrillation: Secondary | ICD-10-CM

## 2020-11-17 NOTE — Patient Instructions (Addendum)
Visit Information  Patient Goal:  Manage Emotions; Manage anxiety issues faced. Manage depression issues faced  Timeframe:  Short-Term Goal Priority:  Medium Progress: On Track Start Date:           11/17/20                Expected End Date:         02/14/21            Follow Up Date 01/14/21 at 9:00 AM   Manage Emotions; Manage anxiety issues faced; Manage depression issues faced    Why is this important?   When you are stressed, down or upset, your body reacts too.  For example, your blood pressure may get higher; you may have a headache or stomachache.  When your emotions get the best of you, your body's ability to fight off cold and flu gets weak.  These steps will help you manage your emotions.     Patient Self Care Activities:  Self administers medications as prescribed Attends all scheduled provider appointments Performs ADL's independently  Patient Coping Strengths:  Supportive Relationships Family Friends  Patient Self Care Deficits:  Mobility issues Depression and anxiety challenges Pain issues in her right knee  Patient Goals:  - spend time or talk with others at least 2 to 3 times per week - practice relaxation or meditation daily - keep a calendar with appointment dates  Follow Up Plan: LCSW to call client or family representative on 01/14/21 at 9:00 AM to assess client needs   Norva Riffle.Yvonne Petite MSW, LCSW Licensed Clinical Social Worker [facility] Care Management [phone]

## 2020-11-17 NOTE — Chronic Care Management (AMB) (Signed)
Chronic Care Management    Clinical Social Work Note  11/17/2020 Name: [name] MRN: [id number] DOB: 09/21/1931  [name] is a 87 y.o. year old female who is a primary care patient of [name], DO. The CCM team was consulted to assist the patient with chronic disease management and/or care coordination needs related to: [employer] .   Engaged with patient /daughter of patient, [name], by telephone for follow up visit in response to provider referral for social work chronic care management and care coordination services.   Consent to Services:  The patient was given information about Chronic Care Management services, agreed to services, and gave verbal consent prior to initiation of services.  Please see initial visit note for detailed documentation.   Patient agreed to services and consent obtained.   Assessment: Review of patient past medical history, allergies, medications, and health status, including review of relevant consultants reports was performed today as part of a comprehensive evaluation and provision of chronic care management and care coordination services.     SDOH (Social Determinants of Health) assessments and interventions performed:  SDOH Interventions    Flowsheet Row Most Recent Value  SDOH Interventions   Physical Activity Interventions Other (Comments)  [walking challenges,  uses a walker to ambulate]  Stress Interventions Provide Counseling  [client has stress related to managing her medical needs]  Depression Interventions/Treatment  Currently on Treatment        Advanced Directives Status: See Vynca application for related entries.  CCM Care Plan  Allergies  Allergen Reactions   Sotalol Other (See Comments)    Torsades    Tramadol Other (See Comments)    Hallucination, bad-dream   Actos [Pioglitazone] Swelling   Adhesive [Tape] Other (See Comments)    Causes sores   Atorvastatin Other (See Comments)     myalgia   Bactrim [Sulfamethoxazole-Trimethoprim] Itching and Rash    Itchy rash   Ciprofloxacin Nausea Only   Contrast Media [Iodinated Diagnostic Agents] Other (See Comments)    Headache (no action/pre-med required)   Latex Rash   Pedi-Pre Tape Spray [Wound Dressing Adhesive] Other (See Comments)    Causes sores   Sulfa Antibiotics Other (See Comments)    Causes sores    Outpatient Encounter Medications as of 11/17/2020  Medication Sig   acetaminophen (TYLENOL) 500 MG tablet Take 1 tablet (500 mg total) by mouth every 6 (six) hours as needed for mild pain, fever or headache.   Alpha-Lipoic Acid 600 MG TABS Take 600 mg by mouth daily. For neuropathy in legs   amiodarone (PACERONE) 200 MG tablet Take 1 tablet (200 mg total) by mouth daily.   carboxymethylcellulose (REFRESH PLUS) 0.5 % SOLN Place 1 drop into both eyes 3 (three) times daily as needed (dry/irritated eyes.).   Cholecalciferol (VITAMIN D) 50 MCG (2000 UT) CAPS Take 2,000 Units by mouth every morning.   DULoxetine (CYMBALTA) 30 MG capsule Take 1 capsule (30 mg total) by mouth daily. TO REPLACE PAXIL for mood/ pain   ELIQUIS 2.5 MG TABS tablet TAKE  (1)  TABLET TWICE A DAY. (Patient taking differently: Take 2.5 mg by mouth 2 (two) times daily.)   feeding supplement (ENSURE ENLIVE / ENSURE PLUS) LIQD Take 237 mLs by mouth 2 (two) times daily between meals.   furosemide (LASIX) 20 MG tablet Take 20 mg by mouth 3 (three) times a week.   gabapentin (NEURONTIN) 100 MG capsule Take 1-3 capsules (100-300 mg total) by mouth at  bedtime as needed.   isosorbide mononitrate (IMDUR) 30 MG 24 hr tablet Take 1 tablet (30 mg total) by mouth daily.   levothyroxine (SYNTHROID) 100 MCG tablet Take 1 tablet (100 mcg total) by mouth every morning.   MELATONIN PO Take 3 mg by mouth at bedtime.   metoprolol tartrate (LOPRESSOR) 25 MG tablet Take 0.5 tablets (12.5 mg total) by mouth 2 (two) times daily. HOLD FOR SBP<95 OR HR<65   Multiple  Vitamins-Minerals (CENTRUM SILVER ULTRA WOMENS PO) Take 1 tablet by mouth every morning.   nitroGLYCERIN (NITROSTAT) 0.4 MG SL tablet Place 1 tablet (0.4 mg total) under the tongue every 5 (five) minutes as needed for chest pain.   pantoprazole (PROTONIX) 40 MG tablet TAKE 1 TABLET ONCE DAILY   potassium chloride SA (KLOR-CON) 20 MEQ tablet Take 20 mEq by mouth 3 (three) times a week.   rosuvastatin (CRESTOR) 10 MG tablet TAKE 1 TABLET DAILY   No facility-administered encounter medications on file as of 11/17/2020.    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   Intractable abdominal pain 03/28/2020   Lactic acidosis 03/28/2020   Elevated troponin I level 03/28/2020   Malnutrition of moderate degree 12/26/2019   Prolonged QT interval 12/23/2019   Acute lower UTI 12/23/2019   Constipation 11/27/2019   Diarrhea 11/27/2019   Lesion of pancreas 11/26/2019   Fibromuscular dysplasia of right renal artery ([place]) 11/26/2019   Left renal artery stenosis (HCC) 11/26/2019   RUQ pain    Abnormal CT of the abdomen    Vertigo 11/15/2019   Advanced care planning/counseling discussion 11/15/2019   CAD (coronary artery disease)    Hypothyroidism    Hyponatremia    Volume depletion    TSH elevation 08/01/2019   Hospital discharge follow-up 08/01/2019   Exudative age-related macular degeneration of right eye with inactive choroidal neovascularization ([place]) 04/30/2019   Retinal hemorrhage of right eye 04/30/2019   Advanced nonexudative age-related macular degeneration of left eye with subfoveal involvement 04/30/2019   Exudative age-related macular degeneration of left eye with inactive choroidal neovascularization ([place]) 04/30/2019   Advanced nonexudative age-related macular degeneration of right eye with subfoveal involvement  04/30/2019   Posterior vitreous detachment of both eyes 04/30/2019   Vitreous degeneration, bilateral 04/30/2019   Secondary hypercoagulable state ([place]) 03/11/2019   Persistent atrial fibrillation (HCC)    Proctitis 01/09/2019   Atrial fibrillation, chronic ([place]) 01/09/2019   Hyperlipidemia 01/09/2019   Nausea 01/09/2019   Hyperglycemia 01/09/2019   COVID-19 virus infection 12/28/2018   Chronic diarrhea 09/18/2018   Abdominal pain 09/18/2018   Osteoarthritis of right knee 03/29/2018   GERD (gastroesophageal reflux disease)    Dehydration 02/06/2018   Hypokalemia 02/06/2018   Diarrhea of presumed infectious origin 02/06/2018   Acute kidney injury superimposed on CKD ([place]) 02/05/2018   Essential hypertension 12/21/2017   Degenerative scoliosis 02/19/2017   Burst fracture of lumbar vertebra ([place]) 02/19/2017   History of drug-induced prolonged QT interval with torsade de pointes 11/01/2016   Iron deficiency anemia 10/30/2016   Long term current use of anticoagulant 02/17/2016   Near syncope 05/22/2015   Diabetes mellitus type 2, diet-controlled ([place]) 09/14/2014   ICD (implantable cardioverter-defibrillator) battery depletion 01/20/2014   Abnormal nuclear stress test, 12/30/13 12/31/2013   Coronary artery disease involving native coronary artery of native heart without  angina pectoris 12/31/2013   Paroxysmal atrial fibrillation, chads2 Vasc2 score of 5, on eliquis 10/16/2013   Catecholaminergic polymorphic ventricular tachycardia 10/16/2013   Ureteral stone with hydronephrosis 86/57/8469   Metabolic syndrome 62/95/2841   Orthostatic hypotension 07/27/2011   Chest pain 07/25/2011   Weakness 07/25/2011    Class: Acute   Chronic sinus bradycardia 07/25/2011   ICD (implantable cardioverter-defibrillator) in place 07/25/2011   CKD (chronic kidney disease), stage III ([place]) 07/25/2011    Class: Chronic   History of DVT in the past, on eliquis now 07/25/2011    Class: History of    Nephrolithiasis, just saw Dr [name] 07/25/2011    Class: History of   DYSPHAGIA 03/24/2010   Chronic ischemic heart disease 12/03/2007   Irritable bowel syndrome with diarrhea 12/03/2007   EPIGASTRIC PAIN 12/03/2007   Hyperlipidemia associated with type 2 diabetes mellitus ([place]) 12/02/2007    Class: History of   EXTERNAL HEMORRHOIDS 12/02/2007    Class: History of   ESOPHAGITIS 12/02/2007   GASTRITIS 12/02/2007   DIVERTICULOSIS, COLON 12/02/2007   ARTHRITIS 12/02/2007    Class: Chronic    Conditions to be addressed/monitored: monitor client management of anxiety issues and of depression issues  Care Plan : LCSW care plan  Updates made by Katha Cabal, LCSW since 11/17/2020 12:00 AM     Problem: Emotional Distress      Goal: Emotional Health Supported; Manage depression symptoms and manage anxiety symptoms   Start Date: 11/17/2020  Expected End Date: 02/14/2021  This Visit's Progress: On track  Recent Progress: On track  Priority: Medium  Note:   Current Barriers:  Chronic Mental Health needs related to depression and anxiety Mobility issues (uses a walker to help her walk) Suicidal Ideation/Homicidal Ideation: No Pain issue in right knee Vision needs   Clinical Social Work Goal(s):  patient will work with SW monthly by telephone or in person to reduce or manage symptoms related to depression and anxiety Patient will work with SW monthly by telephone or in person to discuss mobiltiy of client and pain issues of client Client or son/daughter of client will call RNCM as needed in next 30 days to discuss nursing needs of client  Interventions:  1:1 collaboration with [name], DO regarding development and update of comprehensive plan of care as evidenced by provider attestation and co-signature Discussed with [name], daughter of client, the current needs of client Discussed with [name] family support for client (support from her 3 sons and  from her daughter) Reviewed with [name] the in home care support for client. [employer] program aide. helps client Monday through Friday for 8 hours each day. Reviewed with [name] the mood status of client. [name] said that client is occasionally in a sad mood, sometimes has a negative mood.  Client has strong family support which helps with mood issues. [name] said client likes to work with home health aide in making wreaths and flower arrangements as  a form of relaxation for client.  Discussed with [facility] client ambulation. Client uses a walker to help her walk,  Discussed with [place] energy level of client. [name] said client has reduced energy.  Reviewed with [name] client uses of Boost supplement or Ensure supplement. [name] said client drinks 3 supplement drinks per day. [name] said client has reduced appetite and eats small meals.  Encouraged client or [name] to call RNCM as needed to discuss nursing needs of client.  Provided counseling support for [facility] regarding care needs of  client.   Patient Self Care Activities:  Self administers medications as prescribed Attends all scheduled provider appointments Performs ADL's independently  Patient Coping Strengths:  Supportive Relationships Family Friends  Patient Self Care Deficits:  Mobility issues Depression and anxiety challenges Pain issues in her right knee  Patient Goals:  - spend time or talk with others at least 2 to 3 times per week - practice relaxation or meditation daily - keep a calendar with appointment dates  Follow Up Plan: LCSW to call client or family representative on 01/14/21 at 9:00 AM to assess needs of client.      [name].[name] MSW, LCSW Licensed Clinical Social Worker [facility] Care Management [phone]

## 2020-11-19 ENCOUNTER — Encounter: Payer: Self-pay | Admitting: Family Medicine

## 2020-11-22 ENCOUNTER — Other Ambulatory Visit: Payer: Self-pay | Admitting: Family Medicine

## 2020-11-22 DIAGNOSIS — M792 Neuralgia and neuritis, unspecified: Secondary | ICD-10-CM

## 2020-11-22 DIAGNOSIS — F339 Major depressive disorder, recurrent, unspecified: Secondary | ICD-10-CM

## 2020-11-22 DIAGNOSIS — G8929 Other chronic pain: Secondary | ICD-10-CM

## 2020-11-23 ENCOUNTER — Telehealth: Payer: Self-pay | Admitting: Family Medicine

## 2020-11-23 NOTE — Telephone Encounter (Signed)
Patient aware and verbalized understanding. °

## 2020-11-24 ENCOUNTER — Other Ambulatory Visit: Payer: Self-pay

## 2020-11-24 ENCOUNTER — Encounter: Payer: Self-pay | Admitting: "Endocrinology

## 2020-11-24 ENCOUNTER — Ambulatory Visit (INDEPENDENT_AMBULATORY_CARE_PROVIDER_SITE_OTHER): Payer: Medicare Other | Admitting: "Endocrinology

## 2020-11-24 VITALS — BP 120/68 | HR 60 | Ht 63.0 in | Wt 123.4 lb

## 2020-11-24 DIAGNOSIS — E2749 Other adrenocortical insufficiency: Secondary | ICD-10-CM

## 2020-11-24 DIAGNOSIS — I25119 Atherosclerotic heart disease of native coronary artery with unspecified angina pectoris: Secondary | ICD-10-CM

## 2020-11-24 NOTE — Progress Notes (Signed)
11/24/2020, 6:24 PM           Endocrinology follow-up note  [name] is a 87 y.o.-year-old female patient who is accompanied by her daughter to clinic to follow-up on her recent lab work-up for adrenal sufficiency.    She follows in this clinic for hypothyroidism regularly.      Past Medical History:  Diagnosis Date   AICD (automatic cardioverter/defibrillator) present    Allergy    SESONAL   Aneurysm of infrarenal abdominal aorta    Anxiety    ARTHRITIS    Arthritis    Atrial fibrillation (HCC)    CAD (coronary artery disease)    a. history of cardiac arrest 1993;  b. s/p LAD/LCX stenting in 2003;  c. 07/2011 Cath: LM nl, LAD patent stent, D1 80ost, LCX patent stent, RCA min irregs;  d. 04/2012 MV: EF 66%, no ishcemia;  e. 12/2013 Echo: Ef 55%, no rwma, Gr 1 DD, triv AI/MR, mildly dil LA;  f. 12/2013 Lexi MV: intermediate risk - apical ischemia and inf/infsept fixed defect, ? artifactual.   CAD in native artery 12/31/2013   Previous stents to LAD and Ramus patent on cath today 12/31/13 also There is severe disease in the ostial first diagonal which is unchanged from most recent cardiac catheterization. The right coronary artery could not be engaged selectively but nonselective angiography showed no significant disease in the proximal and midsegment.       Cataract    DENIES   Chronic back pain    Chronic sinus bradycardia    CKD (chronic kidney disease), stage III (HCC)    Clotting disorder ([place])    DVT   COLITIS 12/02/2007   Qualifier: Diagnosis of  By: [name], [name]     Diabetes mellitus without complication ([place])    DENIES   Diverticulosis of colon (without mention of hemorrhage) 2007   Colonoscopy    Esophageal stricture    a. 2012 s/p dil.   Esophagitis, unspecified    a. 2012 EGD   EXTERNAL HEMORRHOIDS    GERD (gastroesophageal reflux disease)     omeprazole   H/O hiatal hernia    Hiatal hernia    a. 2012 EGD.   History of DVT in the past, not on Coumadin now    left  leg   HYPERCHOLESTEROLEMIA    Hypertension    ICD (implantable cardiac defibrillator) in place    a. s/p initial ICD in 1993 in setting of cardiac arrest;  b. 01/2007 gen change: Guidant T135 Vitality DS VR single lead ICD.   Macular degeneration    gets injecton in eye every 5 weeks- last injection - 05/03/2013    Myocardial infarction [facility]) 1993   Near syncope 05/22/2015   Nephrolithiasis, just saw Dr [name]    Orthostatic hypotension    Peripheral vascular disease ([place])    ???  RECTAL BLEEDING 12/03/2007   Qualifier: Diagnosis of  By: [name]    Unspecified gastritis and gastroduodenitis without mention of hemorrhage    a. 2003 EGD->not noted on 2012 EGD.   Unstable angina, neg MI, cath stable.maybe GI 12/30/2013    Past Surgical History:  Procedure Laterality Date   BREAST BIOPSY Left 11/2013   CARDIAC CATHETERIZATION     CARDIAC CATHETERIZATION  01/01/15   patent stents -there is severe disease in ostial 1st diag but without change.    CARDIAC DEFIBRILLATOR PLACEMENT  02/05/2007   Guidant   CARDIOVASCULAR STRESS TEST  12/30/2013   abnormal   CARDIOVERSION N/A 02/13/2019   Procedure: CARDIOVERSION;  Surgeon: Sanda Klein, MD;  Location: [place];  Service: Cardiovascular;  Laterality: N/A;   CATARACT EXTRACTION Right    Dr. Katy Fitch   COLONOSCOPY     COLONOSCOPY WITH PROPOFOL N/A 03/30/2020   Procedure: COLONOSCOPY WITH PROPOFOL;  Surgeon: Harvel Quale, MD;  [facility];  Service: Gastroenterology;  Laterality: N/A;   coronary stents      CYSTOSCOPY WITH URETEROSCOPY Right 05/20/2013   Procedure: CYSTOSCOPY, RIGHT URETEROSCOPY STONE EXTRACTION, Insertion of right DOUBLE J STENT ;  Surgeon: Irine Seal, MD;  Location: [facility];  Service: Urology;  Laterality: Right;   CYSTOSCOPY WITH URETEROSCOPY AND  STENT PLACEMENT N/A 06/03/2013   Procedure: SECOND LOOK CYSTOSCOPY WITH URETEROSCOPY  HOLMIUM LASER LITHO AND STONE EXTRACTION Sammie Bench ;  Surgeon: Malka So, MD;  Location: [facility];  Service: Urology;  Laterality: N/A;   ESOPHAGOGASTRODUODENOSCOPY (EGD) WITH PROPOFOL N/A 11/20/2019   Procedure: ESOPHAGOGASTRODUODENOSCOPY (EGD) WITH PROPOFOL;  Surgeon: Daneil Dolin, MD;  [facility];  Service: Endoscopy;  Laterality: N/A;   GIVENS CAPSULE STUDY N/A 10/30/2016   Procedure: GIVENS CAPSULE STUDY;  Surgeon: Irene Shipper, MD;  Location: [facility] ENDOSCOPY;  Service: Endoscopy;  Laterality: N/A;  NEEDS TO BE ADMITTED FOR OBSERVATION PT. HAS DEFIB   HEMORROIDECTOMY  80's   HOLMIUM LASER APPLICATION Right 3/0/8657   Procedure: HOLMIUM LASER APPLICATION;  Surgeon: Irine Seal, MD;  Location: [facility];  Service: Urology;  Laterality: Right;   HYSTEROSCOPY WITH D & C  09/02/2010   Procedure: DILATATION AND CURETTAGE (D&C) /HYSTEROSCOPY;  Surgeon: Margarette Asal;  Location: [place] [facility];  Service: Gynecology;  Laterality: N/A;  Dilation and Curettage with Hysteroscopy and Polypectomy   IMPLANTABLE CARDIOVERTER DEFIBRILLATOR (ICD) GENERATOR CHANGE N/A 02/10/2014   Procedure: ICD GENERATOR CHANGE;  Surgeon: Sanda Klein, MD;  Location: [place] CATH LAB;  Service: Cardiovascular;  Laterality: N/A;   LEFT HEART CATHETERIZATION WITH CORONARY ANGIOGRAM N/A 07/28/2011   Procedure: LEFT HEART CATHETERIZATION WITH CORONARY ANGIOGRAM;  Surgeon: Lorretta Harp, MD;  Location: [facility] C[facility] LAB;  Service: Cardiovascular;  Laterality: N/A;   LEFT HEART CATHETERIZATION WITH CORONARY ANGIOGRAM N/A 12/31/2013   Procedure: LEFT HEART CATHETERIZATION WITH CORONARY ANGIOGRAM;  Surgeon: Wellington Hampshire, MD;  Location: [place] CATH LAB;  Service: Cardiovascular;  Laterality: N/A;    Social History   Socioeconomic History   Marital status: Widowed    Spouse name: Not on file   Number of children: 4   Years of education: 8   Highest  education level: 8th grade  Occupational History   Occupation: Retired  Tobacco Use   Smoking status: Never   Smokeless tobacco: Never  Vaping Use   Vaping Use: Never used  Substance and Sexual Activity   Alcohol use: No   Drug  use: No   Sexual activity: Never    Birth control/protection: Post-menopausal  Other Topics Concern   Not on file  Social History Narrative   Lives alone - son visits Sundays, daughter visits Saturday   Has an aide Chiquita Loth) 5 days per week   Social Determinants of Radio broadcast assistant Strain: Low Risk    Difficulty of Paying Living Expenses: Not hard at all  Food Insecurity: No Food Insecurity   Worried About Charity fundraiser in the Last Year: Never true   Arboriculturist in the Last Year: Never true  Transportation Needs: No Transportation Needs   Lack of Transportation (Medical): No   Lack of Transportation (Non-Medical): No  Physical Activity: Inactive   Days of Exercise per Week: 0 days   Minutes of Exercise per Session: 0 min  Stress: Stress Concern Present   Feeling of Stress : Rather much  Social Connections: Moderately Isolated   Frequency of Communication with Friends and Family: More than three times a week   Frequency of Social Gatherings with Friends and Family: Twice a week   Attends Religious Services: 1 to 4 times per year   Active Member of [employer] or Organizations: No   Attends Archivist Meetings: Never   Marital Status: Widowed    Family History  Problem Relation Age of Onset   Stroke Mother    Other Mother        brain tumor   Other Father        MI   Heart attack Father    Stroke Sister    Macular degeneration Sister    Diabetes Daughter    Cancer Daughter        ovarian   Colon polyps Daughter    Atrial fibrillation Sister    Hyperlipidemia Sister    Osteoporosis Sister    Stroke Sister    Uterine cancer Sister    Heart attack Brother    Heart disease Brother    Asthma Brother     Breast cancer Neg Hx     Outpatient Encounter Medications as of 11/24/2020  Medication Sig   acetaminophen (TYLENOL) 500 MG tablet Take 1 tablet (500 mg total) by mouth every 6 (six) hours as needed for mild pain, fever or headache.   Alpha-Lipoic Acid 600 MG TABS Take 600 mg by mouth daily. For neuropathy in legs   amiodarone (PACERONE) 200 MG tablet Take 1 tablet (200 mg total) by mouth daily.   carboxymethylcellulose (REFRESH PLUS) 0.5 % SOLN Place 1 drop into both eyes 3 (three) times daily as needed (dry/irritated eyes.).   Cholecalciferol (VITAMIN D) 50 MCG (2000 UT) CAPS Take 2,000 Units by mouth every morning.   DULoxetine (CYMBALTA) 30 MG capsule TAKE ONE CAPSULE ONCE DAILY. STOP PAXIL (PAROXETINE)   ELIQUIS 2.5 MG TABS tablet TAKE  (1)  TABLET TWICE A DAY. (Patient taking differently: Take 2.5 mg by mouth 2 (two) times daily.)   feeding supplement (ENSURE ENLIVE / ENSURE PLUS) LIQD Take 237 mLs by mouth 2 (two) times daily between meals.   furosemide (LASIX) 20 MG tablet Take 20 mg by mouth 3 (three) times a week.   gabapentin (NEURONTIN) 100 MG capsule Take 1-3 capsules (100-300 mg total) by mouth at bedtime as needed.   isosorbide mononitrate (IMDUR) 30 MG 24 hr tablet Take 1 tablet (30 mg total) by mouth daily.   levothyroxine (SYNTHROID) 100 MCG tablet Take 1 tablet (100  mcg total) by mouth every morning.   MELATONIN PO Take 3 mg by mouth at bedtime.   metoprolol tartrate (LOPRESSOR) 25 MG tablet Take 0.5 tablets (12.5 mg total) by mouth 2 (two) times daily. HOLD FOR SBP<95 OR HR<65   Multiple Vitamins-Minerals (CENTRUM SILVER ULTRA WOMENS PO) Take 1 tablet by mouth every morning.   nitroGLYCERIN (NITROSTAT) 0.4 MG SL tablet Place 1 tablet (0.4 mg total) under the tongue every 5 (five) minutes as needed for chest pain.   pantoprazole (PROTONIX) 40 MG tablet TAKE 1 TABLET ONCE DAILY   potassium chloride SA (KLOR-CON) 20 MEQ tablet Take 20 mEq by mouth 3 (three) times a week.    rosuvastatin (CRESTOR) 10 MG tablet TAKE 1 TABLET DAILY   No facility-administered encounter medications on file as of 11/24/2020.    ALLERGIES: Allergies  Allergen Reactions   Sotalol Other (See Comments)    Torsades    Tramadol Other (See Comments)    Hallucination, bad-dream   Actos [Pioglitazone] Swelling   Adhesive [Tape] Other (See Comments)    Causes sores   Atorvastatin Other (See Comments)    myalgia   Bactrim [Sulfamethoxazole-Trimethoprim] Itching and Rash    Itchy rash   Ciprofloxacin Nausea Only   Contrast Media [Iodinated Diagnostic Agents] Other (See Comments)    Headache (no action/pre-med required)   Latex Rash   Pedi-Pre Tape Spray [Wound Dressing Adhesive] Other (See Comments)    Causes sores   Sulfa Antibiotics Other (See Comments)    Causes sores   VACCINATION STATUS: Immunization History  Administered Date(s) Administered   Fluad Quad(high Dose 65+) 10/23/2018, 01/05/2020   Influenza Inj Mdck Quad Pf 10/05/2020   Influenza, High Dose Seasonal PF 11/01/2015, 11/17/2016, 11/13/2017   Influenza,inj,Quad PF,6+ Mos 11/11/2013, 10/21/2014   Influenza-Unspecified 11/16/2012, 10/23/2016   PNEUMOCOCCAL CONJUGATE-20 10/05/2020   Pneumococcal Conjugate-13 11/18/2012   Pneumococcal Polysaccharide-23 01/17/2003   Tdap 05/09/2010   Zoster Recombinat (Shingrix) 08/18/2020   Zoster, Live 05/01/2011     HPI    ARLEY GARANT  is a patient with the above medical history. she is on ongoing treatment for hypothyroidism with levothyroxine 100 mcg p.o. daily before breakfast.  She is accompanied by her daughter who would like to follow-up with her PMD due to distance and transportation issues.   She has no new complaints today.  Her previsit labs are consistent with appropriate replacement with levothyroxine. She was previously worked up for adrenal insufficiency negatively. She is not on steroids.  She does not remember adrenal surgery nor infections.  She is  still reports poor appetite, however her weight is stabilizing after she lost 19 pounds last year.     She denies diarrhea, nausea, nor abdominal pain at this time.  Patient still complains of disequilibrium, and giddiness at times.  She denies any falling.  Pt denies feeling nodules in neck, hoarseness, dysphagia/odynophagia, SOB with lying down.  she denies family history of  thyroid disorders.  No family history of thyroid cancer.  No history of  radiation therapy to head or neck. -  No history of autoimmune thyroid dysfunction.     ROS: Limited as above.   Physical Exam: BP 120/68   Pulse 60   Ht 5\' 3"  (1.6 m)   Wt 123 lb 6.4 oz (56 kg)   BMI 21.86 kg/m  Wt Readings from Last 3 Encounters:  11/24/20 123 lb 6.4 oz (56 kg)  11/05/20 127 lb (57.6 kg)  10/05/20 120 lb 12.8 oz (  54.8 kg)       CMP ( most recent) CMP     Component Value Date/Time   NA 140 11/05/2020 1351   K 5.0 11/05/2020 1351   CL 106 11/05/2020 1351   CO2 22 11/05/2020 1351   GLUCOSE 124 (H) 11/05/2020 1351   GLUCOSE 127 (H) 08/31/2020 1439   BUN 25 11/05/2020 1351   CREATININE 1.32 (H) 11/05/2020 1351   CREATININE 1.52 (H) 07/31/2012 1019   CALCIUM 9.4 11/05/2020 1351   PROT 6.4 (L) 08/31/2020 1439   PROT 6.4 03/23/2020 1046   ALBUMIN 4.0 11/05/2020 1351   AST 21 08/31/2020 1439   ALT 17 08/31/2020 1439   ALKPHOS 66 08/31/2020 1439   BILITOT 0.8 08/31/2020 1439   BILITOT 0.5 03/23/2020 1046   GFRNONAA 47 (L) 08/31/2020 1439   GFRNONAA 33 (L) 07/31/2012 1019   GFRAA 36 (L) 02/05/2020 1111   GFRAA 38 (L) 07/31/2012 1019     Diabetic Labs (most recent): Lab Results  Component Value Date   HGBA1C 6.6 08/15/2019   HGBA1C 7.0 (H) 10/23/2018   HGBA1C 7.1 (H) 07/12/2018     Lipid Panel ( most recent) Lipid Panel     Component Value Date/Time   CHOL 164 12/23/2019 2104   CHOL 139 10/23/2018 0947   CHOL 140 07/31/2012 1019   TRIG 61 12/23/2019 2104   TRIG 115 06/07/2015 1111    TRIG 161 (H) 07/31/2012 1019   HDL 66 12/23/2019 2104   HDL 53 10/23/2018 0947   HDL 68 06/07/2015 1111   HDL 43 07/31/2012 1019   CHOLHDL 2.5 12/23/2019 2104   VLDL 12 12/23/2019 2104   LDLCALC 86 12/23/2019 2104   LDLCALC 65 10/23/2018 0947   LDLCALC 65 09/09/2013 0841   LDLCALC 65 07/31/2012 1019   LABVLDL 21 10/23/2018 0947       Lab Results  Component Value Date   TSH 2.130 11/05/2020   TSH 1.830 03/23/2020   TSH 6.180 (H) 02/05/2020   TSH 7.000 (H) 01/05/2020   TSH 6.950 (H) 09/24/2019   TSH 14.400 (H) 08/15/2019   TSH 56.294 (H) 07/25/2019   TSH 51.373 (H) 07/09/2019   TSH 5.095 (H) 03/11/2019   TSH 3.439 02/05/2018   FREET4 1.64 11/05/2020   FREET4 1.50 09/24/2019   FREET4 0.66 07/26/2019   FREET4 1.20 12/07/2009    April 02, 2018 2 AM cortisol is 5.4 March 30, 2018 2 AM cortisol 5.3   ASSESSMENT:  1.  Hypocortisolism 2. Hypothyroidism-likely amiodarone induced PLAN:   -I discussed her lab results with her and her daughter.  The labs are consistent with appropriate replacement with levothyroxine.  She previously has negative work-up for adrenal sufficiency.  Family would like to discontinue follow-up here due to distance.  She will need TSH and free T4 be checked in 6 months with her PMD.     She is advised to continue levothyroxine 100 mcg p.o. daily before breakfast.   - We discussed about the correct intake of her thyroid hormone, on empty stomach at fasting, with water, separated by at least 30 minutes from breakfast and other medications,  and separated by more than 4 hours from calcium, iron, multivitamins, acid reflux medications (PPIs). -Patient is made aware of the fact that thyroid hormone replacement is needed for life, dose to be adjusted by periodic monitoring of thyroid function tests.  -Due to absence of clinical goiter, no need for thyroid ultrasound. -I also have a long discussion about the  need to stay on amiodarone based on the cardiology  recommendation as necessary.   She is advised to maintain close follow-up with her PCP Dr. Lajuana Ripple.    I spent 30 minutes in the care of the patient today including review of labs from Thyroid Function, CMP, and other relevant labs ; imaging/biopsy records (current and previous including abstractions from other facilities); face-to-face time discussing  her lab results and symptoms, medications doses, her options of short and long term treatment based on the latest standards of care / guidelines;   and documenting the encounter.  Etheleen Nicks  participated in the discussions, expressed understanding, and voiced agreement with the above plans.  All questions were answered to her satisfaction. she is encouraged to contact clinic should she have any questions or concerns prior to her return visit.    Return if symptoms worsen or fail to improve.  Glade Lloyd, MD [facility] [address] [place], [place] [address] Phone: [phone]  Fax: [phone]   11/24/2020, 6:24 PM  This note was partially dictated with voice recognition software. Similar sounding words can be transcribed inadequately or may not  be corrected upon review.

## 2020-11-25 DIAGNOSIS — N1832 Chronic kidney disease, stage 3b: Secondary | ICD-10-CM | POA: Diagnosis not present

## 2020-11-25 DIAGNOSIS — I129 Hypertensive chronic kidney disease with stage 1 through stage 4 chronic kidney disease, or unspecified chronic kidney disease: Secondary | ICD-10-CM | POA: Diagnosis not present

## 2020-11-25 DIAGNOSIS — R809 Proteinuria, unspecified: Secondary | ICD-10-CM | POA: Diagnosis not present

## 2020-11-25 DIAGNOSIS — I5032 Chronic diastolic (congestive) heart failure: Secondary | ICD-10-CM | POA: Diagnosis not present

## 2020-12-06 DIAGNOSIS — I2510 Atherosclerotic heart disease of native coronary artery without angina pectoris: Secondary | ICD-10-CM | POA: Diagnosis not present

## 2020-12-06 DIAGNOSIS — I739 Peripheral vascular disease, unspecified: Secondary | ICD-10-CM | POA: Diagnosis not present

## 2020-12-06 DIAGNOSIS — K219 Gastro-esophageal reflux disease without esophagitis: Secondary | ICD-10-CM | POA: Diagnosis not present

## 2020-12-06 DIAGNOSIS — Z7901 Long term (current) use of anticoagulants: Secondary | ICD-10-CM | POA: Diagnosis not present

## 2020-12-06 DIAGNOSIS — N183 Chronic kidney disease, stage 3 unspecified: Secondary | ICD-10-CM | POA: Diagnosis not present

## 2020-12-06 DIAGNOSIS — E1122 Type 2 diabetes mellitus with diabetic chronic kidney disease: Secondary | ICD-10-CM | POA: Diagnosis not present

## 2020-12-06 DIAGNOSIS — K5900 Constipation, unspecified: Secondary | ICD-10-CM | POA: Diagnosis not present

## 2020-12-06 DIAGNOSIS — E1140 Type 2 diabetes mellitus with diabetic neuropathy, unspecified: Secondary | ICD-10-CM | POA: Diagnosis not present

## 2020-12-06 DIAGNOSIS — E7800 Pure hypercholesterolemia, unspecified: Secondary | ICD-10-CM | POA: Diagnosis not present

## 2020-12-06 DIAGNOSIS — I130 Hypertensive heart and chronic kidney disease with heart failure and stage 1 through stage 4 chronic kidney disease, or unspecified chronic kidney disease: Secondary | ICD-10-CM | POA: Diagnosis not present

## 2020-12-06 DIAGNOSIS — M1711 Unilateral primary osteoarthritis, right knee: Secondary | ICD-10-CM | POA: Diagnosis not present

## 2020-12-06 DIAGNOSIS — I480 Paroxysmal atrial fibrillation: Secondary | ICD-10-CM | POA: Diagnosis not present

## 2020-12-13 DIAGNOSIS — M1711 Unilateral primary osteoarthritis, right knee: Secondary | ICD-10-CM | POA: Diagnosis not present

## 2020-12-13 DIAGNOSIS — I480 Paroxysmal atrial fibrillation: Secondary | ICD-10-CM | POA: Diagnosis not present

## 2020-12-13 DIAGNOSIS — I130 Hypertensive heart and chronic kidney disease with heart failure and stage 1 through stage 4 chronic kidney disease, or unspecified chronic kidney disease: Secondary | ICD-10-CM | POA: Diagnosis not present

## 2020-12-13 DIAGNOSIS — I739 Peripheral vascular disease, unspecified: Secondary | ICD-10-CM | POA: Diagnosis not present

## 2020-12-13 DIAGNOSIS — E1122 Type 2 diabetes mellitus with diabetic chronic kidney disease: Secondary | ICD-10-CM | POA: Diagnosis not present

## 2020-12-13 DIAGNOSIS — E1140 Type 2 diabetes mellitus with diabetic neuropathy, unspecified: Secondary | ICD-10-CM | POA: Diagnosis not present

## 2020-12-17 ENCOUNTER — Ambulatory Visit (INDEPENDENT_AMBULATORY_CARE_PROVIDER_SITE_OTHER): Payer: Medicare Other

## 2020-12-17 ENCOUNTER — Other Ambulatory Visit: Payer: Self-pay

## 2020-12-17 DIAGNOSIS — I480 Paroxysmal atrial fibrillation: Secondary | ICD-10-CM

## 2020-12-17 DIAGNOSIS — Z7901 Long term (current) use of anticoagulants: Secondary | ICD-10-CM

## 2020-12-17 DIAGNOSIS — K5900 Constipation, unspecified: Secondary | ICD-10-CM | POA: Diagnosis not present

## 2020-12-17 DIAGNOSIS — I130 Hypertensive heart and chronic kidney disease with heart failure and stage 1 through stage 4 chronic kidney disease, or unspecified chronic kidney disease: Secondary | ICD-10-CM

## 2020-12-17 DIAGNOSIS — K219 Gastro-esophageal reflux disease without esophagitis: Secondary | ICD-10-CM | POA: Diagnosis not present

## 2020-12-17 DIAGNOSIS — E1122 Type 2 diabetes mellitus with diabetic chronic kidney disease: Secondary | ICD-10-CM | POA: Diagnosis not present

## 2020-12-17 DIAGNOSIS — I2510 Atherosclerotic heart disease of native coronary artery without angina pectoris: Secondary | ICD-10-CM | POA: Diagnosis not present

## 2020-12-17 DIAGNOSIS — M1711 Unilateral primary osteoarthritis, right knee: Secondary | ICD-10-CM

## 2020-12-17 DIAGNOSIS — E7800 Pure hypercholesterolemia, unspecified: Secondary | ICD-10-CM | POA: Diagnosis not present

## 2020-12-17 DIAGNOSIS — E1140 Type 2 diabetes mellitus with diabetic neuropathy, unspecified: Secondary | ICD-10-CM | POA: Diagnosis not present

## 2020-12-17 DIAGNOSIS — N183 Chronic kidney disease, stage 3 unspecified: Secondary | ICD-10-CM | POA: Diagnosis not present

## 2020-12-17 DIAGNOSIS — I739 Peripheral vascular disease, unspecified: Secondary | ICD-10-CM | POA: Diagnosis not present

## 2020-12-22 DIAGNOSIS — I480 Paroxysmal atrial fibrillation: Secondary | ICD-10-CM | POA: Diagnosis not present

## 2020-12-22 DIAGNOSIS — I739 Peripheral vascular disease, unspecified: Secondary | ICD-10-CM | POA: Diagnosis not present

## 2020-12-22 DIAGNOSIS — E1122 Type 2 diabetes mellitus with diabetic chronic kidney disease: Secondary | ICD-10-CM | POA: Diagnosis not present

## 2020-12-22 DIAGNOSIS — I130 Hypertensive heart and chronic kidney disease with heart failure and stage 1 through stage 4 chronic kidney disease, or unspecified chronic kidney disease: Secondary | ICD-10-CM | POA: Diagnosis not present

## 2020-12-22 DIAGNOSIS — M1711 Unilateral primary osteoarthritis, right knee: Secondary | ICD-10-CM | POA: Diagnosis not present

## 2020-12-22 DIAGNOSIS — E1140 Type 2 diabetes mellitus with diabetic neuropathy, unspecified: Secondary | ICD-10-CM | POA: Diagnosis not present

## 2021-01-03 ENCOUNTER — Encounter: Payer: Medicare Other | Admitting: Cardiovascular Disease

## 2021-01-05 ENCOUNTER — Other Ambulatory Visit: Payer: Self-pay | Admitting: Family Medicine

## 2021-01-13 ENCOUNTER — Encounter (INDEPENDENT_AMBULATORY_CARE_PROVIDER_SITE_OTHER): Payer: Medicare Other | Admitting: Ophthalmology

## 2021-01-14 ENCOUNTER — Ambulatory Visit (INDEPENDENT_AMBULATORY_CARE_PROVIDER_SITE_OTHER): Payer: Medicare Other | Admitting: Licensed Clinical Social Worker

## 2021-01-14 DIAGNOSIS — I10 Essential (primary) hypertension: Secondary | ICD-10-CM

## 2021-01-14 DIAGNOSIS — R1319 Other dysphagia: Secondary | ICD-10-CM

## 2021-01-14 DIAGNOSIS — F339 Major depressive disorder, recurrent, unspecified: Secondary | ICD-10-CM

## 2021-01-14 DIAGNOSIS — I480 Paroxysmal atrial fibrillation: Secondary | ICD-10-CM

## 2021-01-14 DIAGNOSIS — I2510 Atherosclerotic heart disease of native coronary artery without angina pectoris: Secondary | ICD-10-CM

## 2021-01-14 DIAGNOSIS — R001 Bradycardia, unspecified: Secondary | ICD-10-CM

## 2021-01-14 DIAGNOSIS — K580 Irritable bowel syndrome with diarrhea: Secondary | ICD-10-CM

## 2021-01-14 DIAGNOSIS — E1140 Type 2 diabetes mellitus with diabetic neuropathy, unspecified: Secondary | ICD-10-CM

## 2021-01-14 DIAGNOSIS — E785 Hyperlipidemia, unspecified: Secondary | ICD-10-CM

## 2021-01-14 DIAGNOSIS — Z95810 Presence of automatic (implantable) cardiac defibrillator: Secondary | ICD-10-CM

## 2021-01-14 DIAGNOSIS — N1831 Chronic kidney disease, stage 3a: Secondary | ICD-10-CM

## 2021-01-14 NOTE — Patient Instructions (Addendum)
Visit Information  Patient Goals:  Manage Emotions. Manage Anxiety issues faced. Manage Depression issues faced  Timeframe:  Short-Term Goal Priority:  Medium Progress: On Track Start Date:        01/14/21                Expected End Date:       04/11/21            Follow Up Date  03/15/21 at 9:00 AM   Manage Emotions; Manage anxiety issues faced; Manage depression issues faced    Why is this important?   When you are stressed, down or upset, your body reacts too.  For example, your blood pressure may get higher; you may have a headache or stomachache.  When your emotions get the best of you, your body's ability to fight off cold and flu gets weak.  These steps will help you manage your emotions.     Patient Self Care Activities:  Self administers medications as prescribed Attends all scheduled provider appointments Performs ADL's independently  Patient Coping Strengths:  Supportive Relationships Family Friends  Patient Self Care Deficits:  Mobility issues Depression and anxiety challenges Pain issues in her right knee  Patient Goals:  - spend time or talk with others at least 2 to 3 times per week - practice relaxation or meditation daily - keep a calendar with appointment dates  Follow Up Plan: LCSW to call client or family representative on 03/15/21 at 9:00 AM to assess client needs   Norva Riffle.Murphy Bundick MSW, LCSW Licensed Clinical Social Worker [facility] [phone]

## 2021-01-14 NOTE — Chronic Care Management (AMB) (Signed)
Chronic Care Management    Clinical Social Work Note  01/14/2021 Name: [name] MRN: [id number] DOB: [birth year]  [name] is a 87 y.o. year old female who is a primary care patient of [name], DO. The CCM team was consulted to assist the patient with chronic disease management and/or care coordination needs related to: [employer] .   Engaged with patient by telephone for follow up visit in response to provider referral for social work chronic care management and care coordination services.   Consent to Services:  The patient was given information about Chronic Care Management services, agreed to services, and gave verbal consent prior to initiation of services.  Please see initial visit note for detailed documentation.   Patient agreed to services and consent obtained.   Assessment: Review of patient past medical history, allergies, medications, and health status, including review of relevant consultants reports was performed today as part of a comprehensive evaluation and provision of chronic care management and care coordination services.     SDOH (Social Determinants of Health) assessments and interventions performed:  SDOH Interventions    Flowsheet Row Most Recent Value  SDOH Interventions   Physical Activity Interventions Other (Comments)  [client has ambulation challenges. client uses a walker to help her walk.]  Stress Interventions Other (Comment)  [client has stress related to managing medical needs. client has stress related to memory issues]  Depression Interventions/Treatment  Currently on Treatment        Advanced Directives Status: See Vyn[employer] application for related entries.  CCM Care Plan  Allergies  Allergen Reactions   Sotalol Other (See Comments)    Torsades    Tramadol Other (See Comments)    Hallucination, bad-dream   Actos [Pioglitazone] Swelling   Adhesive [Tape] Other (See Comments)    Causes sores   Atorvastatin  Other (See Comments)    myalgia   Bactrim [Sulfamethoxazole-Trimethoprim] Itching and Rash    Itchy rash   Ciprofloxacin Nausea Only   Contrast Media [Iodinated Contrast Media] Other (See Comments)    Headache (no action/pre-med required)   Latex Rash   Pedi-Pre Tape Spray [Wound Dressing Adhesive] Other (See Comments)    Causes sores   Sulfa Antibiotics Other (See Comments)    Causes sores    Outpatient Encounter Medications as of 01/14/2021  Medication Sig   acetaminophen (TYLENOL) 500 MG tablet Take 1 tablet (500 mg total) by mouth every 6 (six) hours as needed for mild pain, fever or headache.   Alpha-Lipoic Acid 600 MG TABS Take 600 mg by mouth daily. For neuropathy in legs   amiodarone (PACERONE) 200 MG tablet Take 1 tablet (200 mg total) by mouth daily.   carboxymethylcellulose (REFRESH PLUS) 0.5 % SOLN Place 1 drop into both eyes 3 (three) times daily as needed (dry/irritated eyes.).   Cholecalciferol (VITAMIN D) 50 MCG (2000 UT) CAPS Take 2,000 Units by mouth every morning.   DULoxetine (CYMBALTA) 30 MG capsule TAKE ONE CAPSULE ONCE DAILY. STOP PAXIL (PAROXETINE)   ELIQUIS 2.5 MG TABS tablet TAKE  (1)  TABLET TWICE A DAY. (Patient taking differently: Take 2.5 mg by mouth 2 (two) times daily.)   feeding supplement (ENSURE ENLIVE / ENSURE PLUS) LIQD Take 237 mLs by mouth 2 (two) times daily between meals.   furosemide (LASIX) 20 MG tablet Take 20 mg by mouth 3 (three) times a week.   gabapentin (NEURONTIN) 100 MG capsule Take 1-3 capsules (100-300 mg total) by mouth at bedtime as  needed.   isosorbide mononitrate (IMDUR) 30 MG 24 hr tablet Take 1 tablet (30 mg total) by mouth daily.   levothyroxine (SYNTHROID) 100 MCG tablet Take 1 tablet (100 mcg total) by mouth every morning.   MELATONIN PO Take 3 mg by mouth at bedtime.   metoprolol tartrate (LOPRESSOR) 25 MG tablet Take 0.5 tablets (12.5 mg total) by mouth 2 (two) times daily. HOLD FOR SBP<95 OR HR<65   Multiple  Vitamins-Minerals (CENTRUM SILVER ULTRA WOMENS PO) Take 1 tablet by mouth every morning.   nitroGLYCERIN (NITROSTAT) 0.4 MG SL tablet PLACE 1 TABLET UNDER THE TONGUE AT ONSET OF CHEST PAIN EVERY 5 MINTUES UP TO 3 TIMES AS NEEDED   pantoprazole (PROTONIX) 40 MG tablet TAKE 1 TABLET ONCE DAILY   potassium chloride SA (KLOR-CON) 20 MEQ tablet Take 20 mEq by mouth 3 (three) times a week.   rosuvastatin (CRESTOR) 10 MG tablet TAKE 1 TABLET DAILY   No facility-administered encounter medications on file as of 01/14/2021.    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[place]) 05/12/2020   Intractable abdominal pain 03/28/2020   Lactic acidosis 03/28/2020   Elevated troponin I level 03/28/2020   Malnutrition of moderate degree 12/26/2019   Prolonged QT interval 12/23/2019   Acute lower UTI 12/23/2019   Constipation 11/27/2019   Diarrhea 11/27/2019   Lesion of pancreas 11/26/2019   Fibromuscular dysplasia of right renal artery ([place]) 11/26/2019   Left renal artery stenosis (HCC) 11/26/2019   RUQ pain    Abnormal CT of the abdomen    Vertigo 11/15/2019   Advanced care planning/counseling discussion 11/15/2019   CAD (coronary artery disease)    Hypothyroidism    Hyponatremia    Volume depletion    TSH elevation 08/01/2019   Hospital discharge follow-up 08/01/2019   Exudative age-related macular degeneration of right eye with inactive choroidal neovascularization ([place]) 04/30/2019   Retinal hemorrhage of right eye 04/30/2019   Advanced nonexudative age-related macular degeneration of left eye with subfoveal involvement 04/30/2019   Exudative age-related macular degeneration of left eye with inactive choroidal neovascularization ([place]) 04/30/2019   Advanced nonexudative age-related macular degeneration of right eye with subfoveal involvement  04/30/2019   Posterior vitreous detachment of both eyes 04/30/2019   Vitreous degeneration, bilateral 04/30/2019   Secondary hypercoagulable state ([place]) 03/11/2019   Persistent atrial fibrillation (HCC)    Proctitis 01/09/2019   Atrial fibrillation, chronic ([place]) 01/09/2019   Hyperlipidemia 01/09/2019   Nausea 01/09/2019   Hyperglycemia 01/09/2019   COVID-19 virus infection 12/28/2018   Chronic diarrhea 09/18/2018   Abdominal pain 09/18/2018   Osteoarthritis of right knee 03/29/2018   GERD (gastroesophageal reflux disease)    Dehydration 02/06/2018   Hypokalemia 02/06/2018   Diarrhea of presumed infectious origin 02/06/2018   Acute kidney injury superimposed on CKD ([place]) 02/05/2018   Essential hypertension 12/21/2017   Degenerative scoliosis 02/19/2017   Burst fracture of lumbar vertebra ([place]) 02/19/2017   History of drug-induced prolonged QT interval with torsade de pointes 11/01/2016   Iron deficiency anemia 10/30/2016   Long term current use of anticoagulant 02/17/2016   Near syncope 05/22/2015   Diabetes mellitus type 2, diet-controlled ([place]) 09/14/2014   ICD (implantable cardioverter-defibrillator) battery depletion 01/20/2014   Abnormal nuclear stress test, 12/30/13 12/31/2013   Coronary artery disease involving native coronary artery of native heart without  angina pectoris 12/31/2013   Paroxysmal atrial fibrillation, chads2 Vasc2 score of 5, on eliquis 10/16/2013   Catecholaminergic polymorphic ventricular tachycardia 10/16/2013   Ureteral stone with hydronephrosis 89/21/1941   Metabolic syndrome 74/08/1446   Orthostatic hypotension 07/27/2011   Chest pain 07/25/2011   Weakness 07/25/2011    Class: Acute   Chronic sinus bradycardia 07/25/2011   ICD (implantable cardioverter-defibrillator) in place 07/25/2011   CKD (chronic kidney disease), stage III ([place]) 07/25/2011    Class: Chronic   History of DVT in the past, on eliquis now 07/25/2011    Class: History of    Nephrolithiasis, just saw Dr [name] 07/25/2011    Class: History of   DYSPHAGIA 03/24/2010   Chronic ischemic heart disease 12/03/2007   Irritable bowel syndrome with diarrhea 12/03/2007   EPIGASTRIC PAIN 12/03/2007   Hyperlipidemia associated with type 2 diabetes mellitus ([place]) 12/02/2007    Class: History of   EXTERNAL HEMORRHOIDS 12/02/2007    Class: History of   ESOPHAGITIS 12/02/2007   GASTRITIS 12/02/2007   DIVERTICULOSIS, COLON 12/02/2007   ARTHRITIS 12/02/2007    Class: Chronic    Conditions to be addressed/monitored: monitor client management of anxiety issues and of depression issues  Care Plan : LCSW care plan  Updates made by Katha Cabal, LCSW since 01/14/2021 12:00 AM     Problem: Emotional Distress      Goal: Emotional Health Supported; Manage depression symptoms and manage anxiety symptoms   Start Date: 01/14/2021  Expected End Date: 04/11/2021  This Visit's Progress: On track  Recent Progress: On track  Priority: Medium  Note:   Current Barriers:  Chronic Mental Health needs related to depression and anxiety Mobility issues (uses a walker to help her walk) Suicidal Ideation/Homicidal Ideation: No Pain issue in right knee Vision needs  Hearing deficit  Clinical Social Work Goal(s):  patient will work with SW monthly by telephone or in person to reduce or manage symptoms related to depression and anxiety Patient will work with SW monthly by telephone or in person to discuss mobiltiy of client and pain issues of client Client or son/daughter of client will call RNCM as needed in next 30 days to discuss nursing needs of client Client will communicate with SW in next 30 days to discuss hearing needs of client  Interventions:  1:1 collaboration with [name], DO regarding development and update of comprehensive plan of care as evidenced by provider attestation and co-signature Discussed client needs with [place]. Discussed  family support for client. Client has support from her children. She has 3 sons and one daughter.  She said that her children are supportive. Reviewed with client the in home care support for client. CAP program aide. helps client Monday through Friday for 8 hours each day. Client said that CAP program aide is very helpful to client. Aide helps client with meals, helps client with transport to and from medical appointments, help client write down information needed by client. Discussed hearing needs of client.  LCSW gave client the name of [facility] in [place], [place] and gave client the phone number for that agency.  LCSW also gave client the phone number for :  [facility] at [phone].  Client has both of these phone numbers written down and can talk with her family about both of these resources for hearing needs Reviewed mood status of client. Client said she does get a little sad sometimes.  She is  taking Cymbalta  as prescribed. She said that overall that she thought her mood was doing well Discussed relaxation techniques of client. She said that she likes to arrange flowers and likes to make flower arrangements to give to others. She said she and home health aide make flower arrangements Discussed client support from cardiologist.   Discussed energy level of client Discussed appetite of client. Client said she has reduced appetite. She said she often drinks about 3 Boost supplements per day Discussed client ambulation. Client uses a walker to help her walk,  Provided counseling support for client Encouraged client to call RNCM as needed to discuss nursing needs of client.  .Reviewed vision needs of client.  Client said she has an upcoming eye appointment with Dr. Zadie Rhine Reviewed pain issues of client. She said she has right knee pain issue  Patient Self Care Activities:  Attends scheduled medical appointments with help of home health aide Allows time  for rest and relaxation Takes time for hobby of making floral arrangements  Patient Coping Strengths:  Supportive Relationships Family Friends  Patient Self Care Deficits:  Mobility issues Depression and anxiety challenges Pain issues in her right knee  Patient Goals:  - spend time or talk with others at least 2 to 3 times per week - practice relaxation or meditation daily - keep a calendar with appointment dates  Follow Up Plan: LCSW to call client or family representative on 03/15/21 at 9:00 AM  to assess needs of client.      Norva Riffle.Rommie Dunn MSW, LCSW Licensed Clinical Social Worker [facility] Care Management [phone]

## 2021-01-19 ENCOUNTER — Ambulatory Visit (INDEPENDENT_AMBULATORY_CARE_PROVIDER_SITE_OTHER): Payer: Medicare Other

## 2021-01-19 DIAGNOSIS — Z95810 Presence of automatic (implantable) cardiac defibrillator: Secondary | ICD-10-CM | POA: Diagnosis not present

## 2021-01-19 LAB — CUP PACEART REMOTE DEVICE CHECK
Battery Remaining Longevity: 126 mo
Battery Remaining Percentage: 100 %
Brady Statistic RV Percent Paced: 0 %
Date Time Interrogation Session: 20230104075200
HighPow Impedance: 38 Ohm
Implantable Lead Implant Date: 20090120
Implantable Lead Location: 753860
Implantable Lead Model: 157
Implantable Lead Serial Number: 138237
Implantable Pulse Generator Implant Date: 20160126
Lead Channel Impedance Value: 516 Ohm
Lead Channel Pacing Threshold Amplitude: 0.7 V
Lead Channel Pacing Threshold Pulse Width: 0.5 ms
Lead Channel Setting Pacing Amplitude: 2.2 V
Lead Channel Setting Pacing Pulse Width: 0.5 ms
Lead Channel Setting Sensing Sensitivity: 0.6 mV
Pulse Gen Serial Number: 191956

## 2021-01-20 ENCOUNTER — Encounter (INDEPENDENT_AMBULATORY_CARE_PROVIDER_SITE_OTHER): Payer: Medicare Other | Admitting: Ophthalmology

## 2021-01-26 ENCOUNTER — Encounter (INDEPENDENT_AMBULATORY_CARE_PROVIDER_SITE_OTHER): Payer: Medicare Other | Admitting: Ophthalmology

## 2021-01-31 ENCOUNTER — Ambulatory Visit (INDEPENDENT_AMBULATORY_CARE_PROVIDER_SITE_OTHER): Payer: Medicare Other | Admitting: Cardiovascular Disease

## 2021-01-31 ENCOUNTER — Telehealth: Payer: Self-pay | Admitting: Family Medicine

## 2021-01-31 ENCOUNTER — Encounter: Payer: Self-pay | Admitting: Cardiovascular Disease

## 2021-01-31 ENCOUNTER — Other Ambulatory Visit: Payer: Self-pay

## 2021-01-31 ENCOUNTER — Telehealth (HOSPITAL_COMMUNITY): Payer: Self-pay | Admitting: *Deleted

## 2021-01-31 VITALS — BP 162/70 | HR 47 | Ht 63.0 in | Wt 124.8 lb

## 2021-01-31 DIAGNOSIS — N1832 Chronic kidney disease, stage 3b: Secondary | ICD-10-CM | POA: Diagnosis not present

## 2021-01-31 DIAGNOSIS — D6869 Other thrombophilia: Secondary | ICD-10-CM | POA: Diagnosis not present

## 2021-01-31 DIAGNOSIS — R001 Bradycardia, unspecified: Secondary | ICD-10-CM

## 2021-01-31 DIAGNOSIS — E7800 Pure hypercholesterolemia, unspecified: Secondary | ICD-10-CM

## 2021-01-31 DIAGNOSIS — I25110 Atherosclerotic heart disease of native coronary artery with unstable angina pectoris: Secondary | ICD-10-CM

## 2021-01-31 DIAGNOSIS — I4729 Other ventricular tachycardia: Secondary | ICD-10-CM | POA: Diagnosis not present

## 2021-01-31 DIAGNOSIS — I700 Atherosclerosis of aorta: Secondary | ICD-10-CM

## 2021-01-31 DIAGNOSIS — Z79899 Other long term (current) drug therapy: Secondary | ICD-10-CM

## 2021-01-31 DIAGNOSIS — I951 Orthostatic hypotension: Secondary | ICD-10-CM | POA: Diagnosis not present

## 2021-01-31 DIAGNOSIS — I480 Paroxysmal atrial fibrillation: Secondary | ICD-10-CM | POA: Diagnosis not present

## 2021-01-31 DIAGNOSIS — E119 Type 2 diabetes mellitus without complications: Secondary | ICD-10-CM | POA: Diagnosis not present

## 2021-01-31 DIAGNOSIS — Z95810 Presence of automatic (implantable) cardiac defibrillator: Secondary | ICD-10-CM

## 2021-01-31 DIAGNOSIS — T50905A Adverse effect of unspecified drugs, medicaments and biological substances, initial encounter: Secondary | ICD-10-CM

## 2021-01-31 DIAGNOSIS — Z5181 Encounter for therapeutic drug level monitoring: Secondary | ICD-10-CM | POA: Diagnosis not present

## 2021-01-31 MED ORDER — ISOSORBIDE MONONITRATE ER 60 MG PO TB24: 60.0000 mg | ORAL_TABLET | Freq: Every day | ORAL | 11 refills | Status: DC

## 2021-01-31 MED ORDER — METOPROLOL SUCCINATE ER 25 MG PO TB24: 12.5000 mg | ORAL_TABLET | Freq: Every day | ORAL | 11 refills | Status: DC

## 2021-01-31 NOTE — Patient Instructions (Addendum)
Medication Instructions:  STOP the Metoprolol Tartrate  START Metoprolol Succinate 12.5 mg (half a tablet)  INCREASE the Isosorbide to 60 mg once daily  *If you need a refill on your cardiac medications before your next appointment, please call your pharmacy*   Lab Work: None ordered If you have labs (blood work) drawn today and your tests are completely normal, you will receive your results only by: [place] (if you have MyChart) OR A paper copy in the mail If you have any lab test that is abnormal or we need to change your treatment, we will call you to review the results.   Testing/Procedures: Your physician has requested that you have a lexiscan myoview. For further information please visit [email or web address]. Please follow instruction sheet, as given. This will take place at [address], [address]  How to prepare for your Myocardial Perfusion Test: Do not eat or drink 3 hours prior to your test, except you may have water. Do not consume products containing caffeine (regular or decaffeinated) 12 hours prior to your test. (ex: coffee, chocolate, sodas, tea). Do bring a list of your current medications with you.  If not listed below, you may take your medications as normal. Do wear comfortable clothes (no dresses or overalls) and walking shoes, tennis shoes preferred (No heels or open toe shoes are allowed). Do NOT wear cologne, perfume, aftershave, or lotions (deodorant is allowed). The test will take approximately 3 to 4 hours to complete If these instructions are not followed, your test will have to be rescheduled. Hold the furosemide the morning of the procedure   Follow-Up: At [facility], you and your health needs are our priority.  As part of our continuing mission to provide you with exceptional heart care, we have created designated Provider Care Teams.  These Care Teams include your primary Cardiologist (physician) and Advanced Practice Providers (APPs -   Physician Assistants and Nurse Practitioners) who all work together to provide you with the care you need, when you need it.  We recommend signing up for the patient portal called "MyChart".  Sign up information is provided on this After Visit Summary.  MyChart is used to connect with patients for Virtual Visits (Telemedicine).  Patients are able to view lab/test results, encounter notes, upcoming appointments, etc.  Non-urgent messages can be sent to your provider as well.   To learn more about what you can do with MyChart, go to [email or web address].    Your next appointment:   3 month(s) on a device day  The format for your next appointment:   In Person  Provider:   Sanda Klein, MD {

## 2021-01-31 NOTE — Telephone Encounter (Signed)
Yes, please proceed with recommendations as outlined by her cardiologist.   I have the upmost confidence that Dr C is ordering cardiac testing and managing her cardiac disease appropriately.  Please encourage her to proceed as he has recommended.

## 2021-01-31 NOTE — Telephone Encounter (Signed)
Went to see heart dr this morning and dr wants to do a scan and she wants your recommendations on if you think she should do the procedure he is ordering

## 2021-01-31 NOTE — Progress Notes (Signed)
Remote ICD transmission.   

## 2021-01-31 NOTE — Progress Notes (Signed)
. Patient ID: [name], female   DOB: October 27, 1931, 88 y.o.   MRN: [id number]    Cardiology Office Note    Date:  01/31/2021   ID:  [name], DOB 1931/03/15, MRN [id number]  PCP:  [name], DO  Cardiologist:   Sanda Klein, MD   Chief Complaint  Patient presents with   Chest Pain     History of Present Illness:  [name] is a 88 y.o. 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  [name] is not feeling well.  She has a lot of complaints of left-sided chest pressure ("ache") and shortness of breath.  This is present at rest, including right now during her interview, but is worsened when she gets up to walk.  She feels tired.  She does not have orthopnea or PND.   In the past her episodes of chest pain appeared to correlate with some consistency with spells of atrial fibrillation, but the chest discomfort appears to be much more persistent now.  Most recently she was seen in the emergency room in August 2022 with chest pain, abnormal cardiac enzymes and mild hypokalemia.  She was in atrial fibrillation with controlled ventricular rate.  ECG nondiagnostic for ischemic heart disease due to LBBB.  She does not have orthopnea or PND.  She has not had lower extremity edema.  She has palpitations in association with atrial fibrillation . she "feels faint "a lot and has had 2 episodes where she had presyncope and had to hold onto something to avoid falling.  She has not  had any full-blown episodes of syncope and has not had falls.  Not clear that she still has symptoms of intestinal angina.  She remains very lean with a BMI of 22, but she has not lost any additional weight over the last 6 months.  Interrogation of her defibrillator provides limited data since she has a single lead ventricular device.  We were able to infer that she had episodes of atrial fibrillation in the past since she had a "second peak" on her heart rate histogram in the 70s and 80s (whereas her sinus rate was typically in the 50s).  On today's interrogation, there is no evidence of a "second peak".  Generator longevity is about 11 years.  Lead parameters are excellent.  The device is programmed to provide backup pacing at 45 bpm and there is 0% ventricular pacing..  She presented with a couple of episodes of abdominal pain evaluated in the emergency room in February and was finally hospitalized in March 2022.  She underwent a colonoscopy that showed diverticulosis and a carpet polyp (not removed due to anticoagulation), but no other major findings.  CT of the abdomen described a focal dissection of the infrarenal aorta (old).  There was no evidence of acute mesenteric ischemia, but the patient was diagnosed as probably having intestinal angina with microvascular etiology (versus bowel hypersensitivity), and improvement was reportedly noted with isosorbide mononitrate 30 mg daily..  Her abdominal pain is a "knotting sensation",  clearly worsened by food and she has developed some degree of food  aversion.  She has to eat very small meals.  Her nephrologist has been encouraging her to make sure that she drinks enough fluid.    She was hospitalized in June 2022 with cellulitis related to an ulceration of the left great toe for about 4 days.  She was seen again in the emergency room with left leg pain (without evidence of recurrent infection) had a CT angiogram of the chest abdomen and pelvis, describing  the presence of aortic atherosclerosis and "old appearing focal dissection flap or a penetrating ulcer in the distal abdominal aorta, lumen measures up to 2.1 cm at this level, no acute dissection, no aneurysm".  The focal dissection was also present on the study in March 2022.  The celiac artery, superior mesenteric artery and inferior mesenteric artery are specifically described as patent.  The right renal artery described as irregular, possible fibromuscular dysplasia without mention of actual stenosis.  There was no evidence of hepatic biliary duct dilation or gallstones.  Indeterminant 7 mm hypodense lesion in the neck of the pancreas was described, although this was not seen on the study from March 2022 and February 2020.  She has extensive chronic nephrolithiasis/nephrocalcinosis and T12 and L1 compression fractures which are old   In the past, she has had complaints of fatigue related to bradycardia from amiodarone and metoprolol.  Reportedly, during her hospitalization in March 2022, she had severe bradycardia with rates in the 30s.  I do not believe this is possible since her defibrillator is set with a lower rate limit for ventricular pacing at 40 bpm.  In addition I reviewed all of the ECG tracings and CV monitor strips from that hospitalization.  The heart rate was never slower than 57 bpm on the any of these documents.  Based on the bradycardia, the discharge summary from March recommended complete discontinuation of amiodarone.  She went to a nursing home.    Subsequent ER visits and office appointments all the way to 08/18/2020 still list amiodarone as an active medication, although the amiodarone was finally permanently removed from her medication list on 08/18/2020.  It is back on her medication list today.    In recent years she has had increasing problems with symptomatic atrial fibrillation.  Although she has a single chamber defibrillator, making it impossible to precisely diagnose the  episodes of atrial fibrillation, there is clear evidence of periods of increased ventricular rate from her baseline, likely representing symptomatic atrial fibrillation.  She develops palpitation, chest discomfort, shortness of breath and if the spells are lengthy she eventually develops overt lower extremity edema.  She underwent a cardioversion in January 2021 from atrial fibrillation to normal rhythm, associated with improvement in symptoms.  She has been taking amiodarone since February.  She was hospitalized with dizziness, orthostatic hypotension and severe hypothyroidism (TSH 56) in July of this year.  It is likely she had iatrogenic hypothyroidism due to amiodarone.  Levothyroxine was started, beta-blocker therapy was discontinued.  She was on treatment with disopyramide for years for her history of catecholaminergic polymorphic VT, but this medication had to be gradually discontinued due to side effects, especially orthostatic hypotension and dry mouth.  In the past she developed torsades de pointes with sotalol and diarrhea with quinidine.  She has not had defibrillator discharges since the 1990s.   Past Medical History:  Diagnosis Date   AICD (automatic cardioverter/defibrillator) present    Allergy    SESONAL   Aneurysm of infrarenal abdominal aorta    Anxiety  ARTHRITIS    Arthritis    Atrial fibrillation (HCC)    CAD (coronary artery disease)    a. history of cardiac arrest 1993;  b. s/p LAD/LCX stenting in 2003;  c. 07/2011 Cath: LM nl, LAD patent stent, D1 80ost, LCX patent stent, RCA min irregs;  d. 04/2012 MV: EF 66%, no ishcemia;  e. 12/2013 Echo: Ef 55%, no rwma, Gr 1 DD, triv AI/MR, mildly dil LA;  f. 12/2013 Lexi MV: intermediate risk - apical ischemia and inf/infsept fixed defect, ? artifactual.   CAD in native artery 12/31/2013   Previous stents to LAD and Ramus patent on cath today 12/31/13 also There is severe disease in the ostial first diagonal which is unchanged from  most recent cardiac catheterization. The right coronary artery could not be engaged selectively but nonselective angiography showed no significant disease in the proximal and midsegment.       Cataract    DENIES   Chronic back pain    Chronic sinus bradycardia    CKD (chronic kidney disease), stage III (HCC)    Clotting disorder ([place])    DVT   COLITIS 12/02/2007   Qualifier: Diagnosis of  By: [name], [name]     Diabetes mellitus without complication ([place])    DENIES   Diverticulosis of colon (without mention of hemorrhage) 2007   Colonoscopy    Esophageal stricture    a. 2012 s/p dil.   Esophagitis, unspecified    a. 2012 EGD   EXTERNAL HEMORRHOIDS    GERD (gastroesophageal reflux disease)    omeprazole   H/O hiatal hernia    Hiatal hernia    a. 2012 EGD.   History of DVT in the past, not on Coumadin now    left  leg   HYPERCHOLESTEROLEMIA    Hypertension    ICD (implantable cardiac defibrillator) in place    a. s/p initial ICD in 1993 in setting of cardiac arrest;  b. 01/2007 gen change: Guidant T135 Vitality DS VR single lead ICD.   Macular degeneration    gets injecton in eye every 5 weeks- last injection - 05/03/2013    Myocardial infarction [facility]) 1993   Near syncope 05/22/2015   Nephrolithiasis, just saw Dr [name]    Orthostatic hypotension    Peripheral vascular disease ([place])    ???   RECTAL BLEEDING 12/03/2007   Qualifier: Diagnosis of  By: [name]    Unspecified gastritis and gastroduodenitis without mention of hemorrhage    a. 2003 EGD->not noted on 2012 EGD.   Unstable angina, neg MI, cath stable.maybe GI 12/30/2013    Past Surgical History:  Procedure Laterality Date   BREAST BIOPSY Left 11/2013   CARDIAC CATHETERIZATION     CARDIAC CATHETERIZATION  01/01/15   patent stents -there is severe disease in ostial 1st diag but without change.    CARDIAC DEFIBRILLATOR PLACEMENT  02/05/2007   Guidant   CARDIOVASCULAR STRESS TEST   12/30/2013   abnormal   CARDIOVERSION N/A 02/13/2019   Procedure: CARDIOVERSION;  Surgeon: Sanda Klein, MD;  Location: [place];  Service: Cardiovascular;  Laterality: N/A;   CATARACT EXTRACTION Right    Dr. Katy Fitch   COLONOSCOPY     COLONOSCOPY WITH PROPOFOL N/A 03/30/2020   Procedure: COLONOSCOPY WITH PROPOFOL;  Surgeon: Harvel Quale, MD;  [facility];  Service: Gastroenterology;  Laterality: N/A;   coronary stents      CYSTOSCOPY WITH URETEROSCOPY Right 05/20/2013  Procedure: CYSTOSCOPY, RIGHT URETEROSCOPY STONE EXTRACTION, Insertion of right DOUBLE J STENT ;  Surgeon: Irine Seal, MD;  Location: [facility];  Service: Urology;  Laterality: Right;   CYSTOSCOPY WITH URETEROSCOPY AND STENT PLACEMENT N/A 06/03/2013   Procedure: SECOND LOOK CYSTOSCOPY WITH URETEROSCOPY  HOLMIUM LASER LITHO AND STONE EXTRACTION Sammie Bench ;  Surgeon: Malka So, MD;  Location: [facility];  Service: Urology;  Laterality: N/A;   ESOPHAGOGASTRODUODENOSCOPY (EGD) WITH PROPOFOL N/A 11/20/2019   Procedure: ESOPHAGOGASTRODUODENOSCOPY (EGD) WITH PROPOFOL;  Surgeon: Daneil Dolin, MD;  [facility];  Service: Endoscopy;  Laterality: N/A;   GIVENS CAPSULE STUDY N/A 10/30/2016   Procedure: GIVENS CAPSULE STUDY;  Surgeon: Irene Shipper, MD;  Location[facility] ENDOSCOPY;  Service: Endoscopy;  Laterality: N/A;  NEEDS TO BE ADMITTED FOR OBSERVATION PT. HAS DEFIB   HEMORROIDECTOMY  80's   HOLMIUM LASER APPLICATION Right 09/22/4161   Procedure: HOLMIUM LASER APPLICATION;  Surgeon: Irine Seal, MD;  Location: [facility];  Service: Urology;  Laterality: Right;   HYSTEROSCOPY WITH D & C  09/02/2010   Procedure: DILATATION AND CURETTAGE (D&C) /HYSTEROSCOPY;  Surgeon: Margarette Asal;  Location: [place] [facility];  Service: Gynecology;  Laterality: N/A;  Dilation and Curettage with Hysteroscopy and Polypectomy   IMPLANTABLE CARDIOVERTER DEFIBRILLATOR (ICD) GENERATOR CHANGE N/A 02/10/2014   Procedure: ICD GENERATOR CHANGE;   Surgeon: Sanda Klein, MD;  Location: [place] CATH LAB;  Service: Cardiovascular;  Laterality: N/A;   LEFT HEART CATHETERIZATION WITH CORONARY ANGIOGRAM N/A 07/28/2011   Procedure: LEFT HEART CATHETERIZATION WITH CORONARY ANGIOGRAM;  Surgeon: Lorretta Harp, MD;  Location[facility] CATH LAB;  Service: Cardiovascular;  Laterality: N/A;   LEFT HEART CATHETERIZATION WITH CORONARY ANGIOGRAM N/A 12/31/2013   Procedure: LEFT HEART CATHETERIZATION WITH CORONARY ANGIOGRAM;  Surgeon: Wellington Hampshire, MD;  Location: [place] CATH LAB;  Service: Cardiovascular;  Laterality: N/A;    Current Medications: Outpatient Medications Prior to Visit  Medication Sig Dispense Refill   Alpha-Lipoic Acid 600 MG TABS Take 600 mg by mouth daily. For neuropathy in legs 90 tablet 3   amiodarone (PACERONE) 200 MG tablet Take 1 tablet (200 mg total) by mouth daily. 90 tablet 3   carboxymethylcellulose (REFRESH PLUS) 0.5 % SOLN Place 1 drop into both eyes 3 (three) times daily as needed (dry/irritated eyes.).     Cholecalciferol (VITAMIN D) 50 MCG (2000 UT) CAPS Take 2,000 Units by mouth every morning.     DULoxetine (CYMBALTA) 30 MG capsule TAKE ONE CAPSULE ONCE DAILY. STOP PAXIL (PAROXETINE) 90 capsule 0   ELIQUIS 2.5 MG TABS tablet TAKE  (1)  TABLET TWICE A DAY. (Patient taking differently: Take 2.5 mg by mouth 2 (two) times daily.) 180 tablet 1   feeding supplement (ENSURE ENLIVE / ENSURE PLUS) LIQD Take 237 mLs by mouth 2 (two) times daily between meals.     furosemide (LASIX) 20 MG tablet Take 20 mg by mouth 3 (three) times a week.     levothyroxine (SYNTHROID) 100 MCG tablet Take 1 tablet (100 mcg total) by mouth every morning. 90 tablet 1   MELATONIN PO Take 3 mg by mouth at bedtime.     Multiple Vitamins-Minerals (CENTRUM SILVER ULTRA WOMENS PO) Take 1 tablet by mouth every morning.     pantoprazole (PROTONIX) 40 MG tablet TAKE 1 TABLET ONCE DAILY 90 tablet 0   potassium chloride SA (KLOR-CON) 20 MEQ tablet Take 20 mEq by mouth 3  (three) times a week.     rosuvastatin (CRESTOR)  10 MG tablet TAKE 1 TABLET DAILY 90 tablet 0   isosorbide mononitrate (IMDUR) 30 MG 24 hr tablet Take 1 tablet (30 mg total) by mouth daily. 90 tablet 2   acetaminophen (TYLENOL) 500 MG tablet Take 1 tablet (500 mg total) by mouth every 6 (six) hours as needed for mild pain, fever or headache. (Patient not taking: Reported on 01/31/2021) 30 tablet 0   gabapentin (NEURONTIN) 100 MG capsule Take 1-3 capsules (100-300 mg total) by mouth at bedtime as needed. (Patient not taking: Reported on 01/31/2021) 90 capsule 3   nitroGLYCERIN (NITROSTAT) 0.4 MG SL tablet PLACE 1 TABLET UNDER THE TONGUE AT ONSET OF CHEST PAIN EVERY 5 MINTUES UP TO 3 TIMES AS NEEDED (Patient not taking: Reported on 01/31/2021) 25 tablet 2   metoprolol tartrate (LOPRESSOR) 25 MG tablet Take 0.5 tablets (12.5 mg total) by mouth 2 (two) times daily. HOLD FOR SBP<95 OR HR<65 (Patient not taking: Reported on 01/31/2021) 90 tablet 1   No facility-administered medications prior to visit.     Allergies:   Sotalol, Tramadol, Actos [pioglitazone], Adhesive [tape], Atorvastatin, Bactrim [sulfamethoxazole-trimethoprim], Ciprofloxacin, Contrast media [iodinated contrast media], Latex, Pedi-pre tape spray [wound dressing adhesive], and Sulfa antibiotics   Social History   Socioeconomic History   Marital status: Widowed    Spouse name: Not on file   Number of children: 4   Years of education: 8   Highest education level: 8th grade  Occupational History   Occupation: Retired  Tobacco Use   Smoking status: Never   Smokeless tobacco: Never  Vaping Use   Vaping Use: Never used  Substance and Sexual Activity   Alcohol use: No   Drug use: No   Sexual activity: Never    Birth control/protection: Post-menopausal  Other Topics Concern   Not on file  Social History Narrative   Lives alone - son visits Sundays, daughter visits Saturday   Has an aide Chiquita Loth) 5 days per week    Social Determinants of Radio broadcast assistant Strain: Low Risk    Difficulty of Paying Living Expenses: Not hard at all  Food Insecurity: No Food Insecurity   Worried About Charity fundraiser in the Last Year: Never true   Arboriculturist in the Last Year: Never true  Transportation Needs: No Transportation Needs   Lack of Transportation (Medical): No   Lack of Transportation (Non-Medical): No  Physical Activity: Inactive   Days of Exercise per Week: 0 days   Minutes of Exercise per Session: 0 min  Stress: Stress Concern Present   Feeling of Stress : To some extent  Social Connections: Moderately Isolated   Frequency of Communication with Friends and Family: More than three times a week   Frequency of Social Gatherings with Friends and Family: Twice a week   Attends Religious Services: 1 to 4 times per year   Active Member of [employer] or Organizations: No   Attends Archivist Meetings: Never   Marital Status: Widowed     Family History:  The patient's family history includes Asthma in her brother; Atrial fibrillation in her sister; Cancer in her daughter; Colon polyps in her daughter; Diabetes in her daughter; Heart attack in her brother and father; Heart disease in her brother; Hyperlipidemia in her sister; Macular degeneration in her sister; Osteoporosis in her sister; Other in her father and mother; Stroke in her mother, sister, and sister; Uterine cancer in her sister.   ROS:  Please see the history of present illness.    ROS All other systems are reviewed and are negative.   PHYSICAL EXAM:   VS:  BP (!) 162/70 (BP Location: Left Arm, Patient Position: Sitting, Cuff Size: Normal)    Pulse (!) 47    Ht 5\' 3"  (1.6 m)    Wt 124 lb 12.8 oz (56.6 kg)    SpO2 97%    BMI 22.11 kg/m      General: Alert, oriented x3, no distress.  The left subclavian defibrillator site shows a very prominent device due to the loss of subcutaneous tissue.  Nevertheless, the skin  overlying the device appears healthy and there is no evidence of inflammation, erythema or impending skin breakdown. Head: no evidence of trauma, PERRL, EOMI, no exophtalmos or lid lag, no myxedema, no xanthelasma; normal ears, nose and oropharynx Neck: normal jugular venous pulsations and no hepatojugular reflux; brisk carotid pulses without delay and no carotid bruits Chest: clear to auscultation, no signs of consolidation by percussion or palpation, normal fremitus, symmetrical and full respiratory excursions Cardiovascular: normal position and quality of the apical impulse, regular rhythm, normal first and second heart sounds, no murmurs, rubs or gallops Abdomen: no tenderness or distention, no masses by palpation, no abnormal pulsatility or arterial bruits, normal bowel sounds, no hepatosplenomegaly Extremities: no clubbing, cyanosis or edema; 2+ radial, ulnar and brachial pulses bilaterally; 2+ right femoral, posterior tibial and dorsalis pedis pulses; 2+ left femoral, posterior tibial and dorsalis pedis pulses; no subclavian or femoral bruits Neurological: grossly nonfocal Psych: Normal mood and affect      Wt Readings from Last 3 Encounters:  01/31/21 124 lb 12.8 oz (56.6 kg)  11/24/20 123 lb 6.4 oz (56 kg)  11/05/20 127 lb (57.6 kg)    Studies/Labs Reviewed:   EKG: ECG is ordered today shows normal sinus rhythm, left bundle branch block (QRS 142 ms) QTC 458 ms.  Recent Labs: 08/31/2020: ALT 17 09/07/2020: Magnesium 2.2 11/05/2020: BUN 25; Creatinine, Ser 1.32; Hemoglobin 11.5; Platelets 217; Potassium 5.0; Sodium 140; TSH 2.130   Lipid Panel    Component Value Date/Time   CHOL 164 12/23/2019 2104   CHOL 139 10/23/2018 0947   CHOL 140 07/31/2012 1019   TRIG 61 12/23/2019 2104   TRIG 115 06/07/2015 1111   TRIG 161 (H) 07/31/2012 1019   HDL 66 12/23/2019 2104   HDL 53 10/23/2018 0947   HDL 68 06/07/2015 1111   HDL 43 07/31/2012 1019   CHOLHDL 2.5 12/23/2019 2104   VLDL 12  12/23/2019 2104   LDLCALC 86 12/23/2019 2104   LDLCALC 65 10/23/2018 0947   LDLCALC 65 09/09/2013 0841   LDLCALC 65 07/31/2012 1019     ASSESSMENT:    1. Coronary artery disease involving native coronary artery of native heart with unstable angina pectoris (HCC)   2. Paroxysmal atrial fibrillation ([place])   3. Bradycardia, drug induced   4. Encounter for monitoring amiodarone therapy   5. Catecholaminergic polymorphic ventricular tachycardia   6. Acquired thrombophilia ([place])   7. Orthostatic hypotension   8. Diabetes mellitus type 2, diet-controlled ([place])   9. Stage 3b chronic kidney disease ([place])   10. ICD (implantable cardioverter-defibrillator) in place   11. Hypercholesterolemia   12. Atherosclerosis of aorta (HCC)      PLAN:  In order of problems listed above:  CAD/Unstable angina: It is possible that she has new coronary lesions.  In the past, her chest pain occurred fairly consistently only when she  had atrial fibrillation, but it is now present even during sinus bradycardia.  Although she has previously undergone 3 PCI-stent procedures she has not actually required revascularization since 2003.  She has undergone cardiac catheterization for chest pain with findings of patent coronary arteries in 2006, 2013 and 2015.  She is not the best candidate for invasive angiography in view of advanced age, diabetes mellitus, abnormal kidney function and need for anticoagulation, all of which place her at increased risk for complications.  I have recommended that we try to increase her dose of long-acting nitrates and schedule her for a nuclear perfusion study, before we schedule her for cardiac catheterization.  Cannot increase the beta-blocker dose due to severe bradycardia.  She is willing to undergo cardiac catheterization if we identify evidence of ischemia.  We discussed the risks and benefits of invasive evaluation and possible angioplasty-stent in some detail today. AFib: Previous  episodes of atrial fibrillation are commonly presented with chest pain and shortness of breath.  Unable to use her device to quantify the exact burden of arrhythmia since she does not have an atrial lead.  On amiodarone and on appropriate anticoagulation with Eliquis. CHADSVasc 6 (age 80, gender, DM, CAD, hypertension).    We have discussed upgrading her device to a dual-chamber pacemaker in the past.  She has declined this and all other invasive procedures. Bradycardia: Reduce the metoprolol succinate to 12.5 mg daily. Amiodarone: Indicated for both ventricular tachycardia and paroxysmal atrial fibrillation.  The medication seems to be protecting her from recurrent atrial fibrillation, based on the less than perfect assessment based on her ventricular rate histogram.  Normal TSH and liver function tests in October 2022 and August 2022 respectively.   VT: None recorded recently.  Last episode of true VT was in April 2021.  She has not had any therapy from her defibrillator since the early 1990s.  She did have a rather lengthy 25 beat nonsustained VT run in September 2020 when we decreased her beta-blocker dose due to bradycardia.  Disopyramide was stopped several years ago due to complaints of dry mouth and orthostatic hypotension.   Eliquis: Dose adjusted for age and worsening renal function.  Her renal function has been fairly volatile.  Although her most recent creatinine was 1.01 and she weighs exactly 120 pounds, I think she should remain on the lower dose of this medication.  No overt bleeding complications. HTN/Orthostatic hypotension: Her blood pressure is a little high today, but she reports that at home her blood pressure is always less than 160, most recently documented at 120/68.  Orthostatic hypotension is probably responsible for her 2 episodes of presyncope. DM: No recent hemoglobin A1c.  After substantial weight loss this is no longer a problem. CKD 3b: Baseline creatinine now seems to be  around 1.1-1.2, corresponding to a GFR of about 45.  Most recent creatinine 1.32 on 11/05/2020.  Followed by Dr. Theador Hawthorne ICD: Normal device function.  The device really sticks out due to loss of soft tissue support.  Discussed the findings that might raise concern for possible skin erosion and infection.  Asked her to call about this promptly if it occurs.  If skin rupture is felt to be imminent, she would do better with a preemptive surgical procedure.  Today, although the skin is clearly rather thin, still appears to be very healthy.  Have offered upgrade to a dual-chamber device, which she has declined. HLP: Most recent lipid profile a year ago with fair values.  Need to  repeat this. Aortic atherosclerosis: Has a chronic focal dissection of the abdominal aorta.  I do not think this can be incriminated as a cause of her meal related abdominal pain. Cognitive decline: Short-term memory continues to deteriorate.  She repeated herself a little bit more often than usual during the appointment today.  Her family member, Delorese Sellin is with her and helps out.  She has a Psychologist, counselling during the day.  The records state that she had "hallucinations" during her hospitalization stay, probably sundowning/delirium Intestinal angina versus bowel hypersensitivity: Her symptoms of intestinal angina, food aversion and gradual weight loss are quite compelling, but the imaging study showed that all 3 of the major mesenteric arterial supply vessels are widely patent.  It is very difficult to prove or disprove the proposed diagnosis of microvascular intestinal angina.  Thankfully, her most recent albumin was improving at 3.9 and she does not have overt signs of malnutrition.  Recent vitamin B12 level was high.  Recent iron studies showed normal ferritin and transferrin saturation values.  She is also not a candidate for aggressive therapies. Discussions about end-of -life care: Had a discussion with Lynzi and her family  member Opal Sidles about the fact that the defibrillator therapies may become undesirable if her physical and cognitive decline continues.  At some point, she should have a discussion with her family about long-term wishes.  She has already filled out DNR papers.  It is likely that quality of life will deteriorate in the future to a point that she would not want defibrillator therapies anymore.  If and when that occurs, we will be able to simply turn off tachycardia therapies.    Medication Adjustments/Labs and Tests Ordered: Current medicines are reviewed at length with the patient today.  Concerns regarding medicines are outlined above.  Medication changes, Labs and Tests ordered today are listed in the Patient Instructions below. Patient Instructions  Medication Instructions:  STOP the Metoprolol Tartrate  START Metoprolol Succinate 12.5 mg (half a tablet)  INCREASE the Isosorbide to 60 mg once daily  *If you need a refill on your cardiac medications before your next appointment, please call your pharmacy*   Lab Work: None ordered If you have labs (blood work) drawn today and your tests are completely normal, you will receive your results only by: [place] (if you have MyChart) OR A paper copy in the mail If you have any lab test that is abnormal or we need to change your treatment, we will call you to review the results.   Testing/Procedures: Your physician has requested that you have a lexiscan myoview. For further information please visit [email or web address]. Please follow instruction sheet, as given. This will take place at [place], [address]  How to prepare for your Myocardial Perfusion Test: Do not eat or drink 3 hours prior to your test, except you may have water. Do not consume products containing caffeine (regular or decaffeinated) 12 hours prior to your test. (ex: coffee, chocolate, sodas, tea). Do bring a list of your current medications with you.  If not  listed below, you may take your medications as normal. Do wear comfortable clothes (no dresses or overalls) and walking shoes, tennis shoes preferred (No heels or open toe shoes are allowed). Do NOT wear cologne, perfume, aftershave, or lotions (deodorant is allowed). The test will take approximately 3 to 4 hours to complete If these instructions are not followed, your test will have to be rescheduled. Hold the furosemide the morning of  the procedure   Follow-Up: At [facility], you and your health needs are our priority.  As part of our continuing mission to provide you with exceptional heart care, we have created designated Provider Care Teams.  These Care Teams include your primary Cardiologist (physician) and Advanced Practice Providers (APPs -  Physician Assistants and Nurse Practitioners) who all work together to provide you with the care you need, when you need it.  We recommend signing up for the patient portal called "MyChart".  Sign up information is provided on this After Visit Summary.  MyChart is used to connect with patients for Virtual Visits (Telemedicine).  Patients are able to view lab/test results, encounter notes, upcoming appointments, etc.  Non-urgent messages can be sent to your provider as well.   To learn more about what you can do with MyChart, go to [email or web address].    Your next appointment:   3 month(s) on a device day  The format for your next appointment:   In Person  Provider:   Sanda Klein, MD {     Signed, Sanda Klein, MD  01/31/2021 3:59 PM    [facility] [address], [place], [place]  [address] Phone: [phone]; Fax: [phone]

## 2021-01-31 NOTE — Telephone Encounter (Signed)
Patient given detailed instructions per Myocardial Perfusion Study Information Sheet for the test on 02/07/21 Patient notified to arrive 15 minutes early and that it is imperative to arrive on time for appointment to keep from having the test rescheduled.  If you need to cancel or reschedule your appointment, please call the office within 24 hours of your appointment. . Patient verbalized understanding.  Kirstie Peri

## 2021-01-31 NOTE — Telephone Encounter (Signed)
Pt wants to speak with nurse. Says she has questions about a heart procedure.

## 2021-01-31 NOTE — Telephone Encounter (Signed)
Pt aware.

## 2021-02-01 ENCOUNTER — Ambulatory Visit (INDEPENDENT_AMBULATORY_CARE_PROVIDER_SITE_OTHER): Payer: Medicare Other | Admitting: Family Medicine

## 2021-02-01 ENCOUNTER — Encounter: Payer: Self-pay | Admitting: Family Medicine

## 2021-02-01 VITALS — BP 135/71 | HR 50 | Temp 98.3°F | Ht 63.0 in | Wt 122.4 lb

## 2021-02-01 DIAGNOSIS — R630 Anorexia: Secondary | ICD-10-CM | POA: Diagnosis not present

## 2021-02-01 DIAGNOSIS — F321 Major depressive disorder, single episode, moderate: Secondary | ICD-10-CM | POA: Diagnosis not present

## 2021-02-01 DIAGNOSIS — F411 Generalized anxiety disorder: Secondary | ICD-10-CM

## 2021-02-01 DIAGNOSIS — G479 Sleep disorder, unspecified: Secondary | ICD-10-CM | POA: Diagnosis not present

## 2021-02-01 MED ORDER — BUSPIRONE HCL 5 MG PO TABS: 5.0000 mg | ORAL_TABLET | Freq: Two times a day (BID) | ORAL | 1 refills | Status: DC

## 2021-02-01 NOTE — Addendum Note (Signed)
Addended by: Sanda Klein on: 02/01/2021 03:53 PM   Modules accepted: Orders

## 2021-02-01 NOTE — Progress Notes (Signed)
Subjective: CC:"nerves" PCP: [name], DO WUJ:[id number] [name] is a 88 y.o. female presenting to clinic today for:  1. Anxiety Patient notes that she continues to suffer from anxiety and depression.  She is accompanied today by her daughter.  She was placed on Cymbalta last visit but only took it for about 3 days before discontinuing.  She cannot remember what the side effects were but they were not tolerable.  She was previously on Paxil, which was discontinued secondary to QTC prolongation in the hospital.  She notes that she has some cardiac studies to be performed on Monday.  She continues to have quite a bit of easy fatigability, sometimes feels like she could even pass out.  This often occurs in the morning time.  She continues to suffer from neuropathy at night and it seems to involve more than just her legs sometimes.  She is currently being treated with gabapentin 200 mg at bedtime is not even sure that it really is helping.  She admits to poor appetite, decreased sleep  ROS: Per HPI  Allergies  Allergen Reactions   Sotalol Other (See Comments)    Torsades    Tramadol Other (See Comments)    Hallucination, bad-dream   Actos [Pioglitazone] Swelling   Adhesive [Tape] Other (See Comments)    Causes sores   Atorvastatin Other (See Comments)    myalgia   Bactrim [Sulfamethoxazole-Trimethoprim] Itching and Rash    Itchy rash   Ciprofloxacin Nausea Only   Contrast Media [Iodinated Contrast Media] Other (See Comments)    Headache (no action/pre-med required)   Latex Rash   Pedi-Pre Tape Spray [Wound Dressing Adhesive] Other (See Comments)    Causes sores   Sulfa Antibiotics Other (See Comments)    Causes sores   Past Medical History:  Diagnosis Date   AICD (automatic cardioverter/defibrillator) present    Allergy    SESONAL   Aneurysm of infrarenal abdominal aorta    Anxiety    ARTHRITIS    Arthritis    Atrial fibrillation (HCC)    CAD (coronary artery  disease)    a. history of cardiac arrest 1993;  b. s/p LAD/LCX stenting in 2003;  c. 07/2011 Cath: LM nl, LAD patent stent, D1 80ost, LCX patent stent, RCA min irregs;  d. 04/2012 MV: EF 66%, no ishcemia;  e. 12/2013 Echo: Ef 55%, no rwma, Gr 1 DD, triv AI/MR, mildly dil LA;  f. 12/2013 Lexi MV: intermediate risk - apical ischemia and inf/infsept fixed defect, ? artifactual.   CAD in native artery 12/31/2013   Previous stents to LAD and Ramus patent on cath today 12/31/13 also There is severe disease in the ostial first diagonal which is unchanged from most recent cardiac catheterization. The right coronary artery could not be engaged selectively but nonselective angiography showed no significant disease in the proximal and midsegment.       Cataract    DENIES   Chronic back pain    Chronic sinus bradycardia    CKD (chronic kidney disease), stage III (HCC)    Clotting disorder ([place])    DVT   COLITIS 12/02/2007   Qualifier: Diagnosis of  By: [name], [name]     Diabetes mellitus without complication ([place])    DENIES   Diverticulosis of colon (without mention of hemorrhage) 2007   Colonoscopy    Esophageal stricture    a. 2012 s/p dil.   Esophagitis, unspecified    a. 2012 EGD   EXTERNAL  HEMORRHOIDS    GERD (gastroesophageal reflux disease)    omeprazole   H/O hiatal hernia    Hiatal hernia    a. 2012 EGD.   History of DVT in the past, not on Coumadin now    left  leg   HYPERCHOLESTEROLEMIA    Hypertension    ICD (implantable cardiac defibrillator) in place    a. s/p initial ICD in 1993 in setting of cardiac arrest;  b. 01/2007 gen change: Guidant T135 Vitality DS VR single lead ICD.   Macular degeneration    gets injecton in eye every 5 weeks- last injection - 05/03/2013    Myocardial infarction [facility]) 1993   Near syncope 05/22/2015   Nephrolithiasis, just saw Dr [name]    Orthostatic hypotension    Peripheral vascular disease ([place])    ???   RECTAL BLEEDING 12/03/2007    Qualifier: Diagnosis of  By: [name]    Unspecified gastritis and gastroduodenitis without mention of hemorrhage    a. 2003 EGD->not noted on 2012 EGD.   Unstable angina, neg MI, cath stable.maybe GI 12/30/2013    Current Outpatient Medications:    acetaminophen (TYLENOL) 500 MG tablet, Take 1 tablet (500 mg total) by mouth every 6 (six) hours as needed for mild pain, fever or headache. (Patient not taking: Reported on 01/31/2021), Disp: 30 tablet, Rfl: 0   Alpha-Lipoic Acid 600 MG TABS, Take 600 mg by mouth daily. For neuropathy in legs, Disp: 90 tablet, Rfl: 3   amiodarone (PACERONE) 200 MG tablet, Take 1 tablet (200 mg total) by mouth daily., Disp: 90 tablet, Rfl: 3   carboxymethylcellulose (REFRESH PLUS) 0.5 % SOLN, Place 1 drop into both eyes 3 (three) times daily as needed (dry/irritated eyes.)., Disp: , Rfl:    Cholecalciferol (VITAMIN D) 50 MCG (2000 UT) CAPS, Take 2,000 Units by mouth every morning., Disp: , Rfl:    DULoxetine (CYMBALTA) 30 MG capsule, TAKE ONE CAPSULE ONCE DAILY. STOP PAXIL (PAROXETINE), Disp: 90 capsule, Rfl: 0   ELIQUIS 2.5 MG TABS tablet, TAKE  (1)  TABLET TWICE A DAY. (Patient taking differently: Take 2.5 mg by mouth 2 (two) times daily.), Disp: 180 tablet, Rfl: 1   feeding supplement (ENSURE ENLIVE / ENSURE PLUS) LIQD, Take 237 mLs by mouth 2 (two) times daily between meals., Disp: , Rfl:    furosemide (LASIX) 20 MG tablet, Take 20 mg by mouth 3 (three) times a week., Disp: , Rfl:    gabapentin (NEURONTIN) 100 MG capsule, Take 1-3 capsules (100-300 mg total) by mouth at bedtime as needed. (Patient not taking: Reported on 01/31/2021), Disp: 90 capsule, Rfl: 3   isosorbide mononitrate (IMDUR) 60 MG 24 hr tablet, Take 1 tablet (60 mg total) by mouth daily., Disp: 30 tablet, Rfl: 11   levothyroxine (SYNTHROID) 100 MCG tablet, Take 1 tablet (100 mcg total) by mouth every morning., Disp: 90 tablet, Rfl: 1   MELATONIN PO, Take 3 mg by mouth at bedtime.,  Disp: , Rfl:    metoprolol succinate (TOPROL-XL) 25 MG 24 hr tablet, Take 0.5 tablets (12.5 mg total) by mouth daily. Take with or immediately following a meal., Disp: 15 tablet, Rfl: 11   Multiple Vitamins-Minerals (CENTRUM SILVER ULTRA WOMENS PO), Take 1 tablet by mouth every morning., Disp: , Rfl:    nitroGLYCERIN (NITROSTAT) 0.4 MG SL tablet, PLACE 1 TABLET UNDER THE TONGUE AT ONSET OF CHEST PAIN EVERY 5 MINTUES UP TO 3 TIMES AS NEEDED (Patient not taking: Reported  on 01/31/2021), Disp: 25 tablet, Rfl: 2   pantoprazole (PROTONIX) 40 MG tablet, TAKE 1 TABLET ONCE DAILY, Disp: 90 tablet, Rfl: 0   potassium chloride SA (KLOR-CON) 20 MEQ tablet, Take 20 mEq by mouth 3 (three) times a week., Disp: , Rfl:    rosuvastatin (CRESTOR) 10 MG tablet, TAKE 1 TABLET DAILY, Disp: 90 tablet, Rfl: 0 Social History   Socioeconomic History   Marital status: Widowed    Spouse name: Not on file   Number of children: 4   Years of education: 8   Highest education level: 8th grade  Occupational History   Occupation: Retired  Tobacco Use   Smoking status: Never   Smokeless tobacco: Never  Vaping Use   Vaping Use: Never used  Substance and Sexual Activity   Alcohol use: No   Drug use: No   Sexual activity: Never    Birth control/protection: Post-menopausal  Other Topics Concern   Not on file  Social History Narrative   Lives alone - son visits Sundays, daughter visits Saturday   Has an aide Chiquita Loth) 5 days per week   Social Determinants of Radio broadcast assistant Strain: Low Risk    Difficulty of Paying Living Expenses: Not hard at all  Food Insecurity: No Food Insecurity   Worried About Charity fundraiser in the Last Year: Never true   Arboriculturist in the Last Year: Never true  Transportation Needs: No Transportation Needs   Lack of Transportation (Medical): No   Lack of Transportation (Non-Medical): No  Physical Activity: Inactive   Days of Exercise per Week: 0 days    Minutes of Exercise per Session: 0 min  Stress: Stress Concern Present   Feeling of Stress : To some extent  Social Connections: Moderately Isolated   Frequency of Communication with Friends and Family: More than three times a week   Frequency of Social Gatherings with Friends and Family: Twice a week   Attends Religious Services: 1 to 4 times per year   Active Member of [employer] or Organizations: No   Attends Archivist Meetings: Never   Marital Status: Widowed  Human resources officer Violence: Not At Risk   Fear of Current or Ex-Partner: No   Emotionally Abused: No   Physically Abused: No   Sexually Abused: No   Family History  Problem Relation Age of Onset   Stroke Mother    Other Mother        brain tumor   Other Father        MI   Heart attack Father    Stroke Sister    Macular degeneration Sister    Diabetes Daughter    Cancer Daughter        ovarian   Colon polyps Daughter    Atrial fibrillation Sister    Hyperlipidemia Sister    Osteoporosis Sister    Stroke Sister    Uterine cancer Sister    Heart attack Brother    Heart disease Brother    Asthma Brother    Breast cancer Neg Hx     Objective: Office vital signs reviewed. BP 135/71    Pulse (!) 50    Temp 98.3 F (36.8 C)    Ht 5\' 3"  (1.6 m)    Wt 122 lb 6.4 oz (55.5 kg)    BMI 21.68 kg/m   Physical Examination:  General: Awake, alert, chronically ill-appearing elderly female, No acute distress HEENT: Sclera white Cardio:  Bradycardic with seemingly regular rhythm.  S1S2 heard, no murmurs appreciated Pulm: clear to auscultation bilaterally, no wheezes, rhonchi or rales; normal work of breathing on room air MSK: slow gait and hunched station Psych: Mood is depressed. Depression screen [facility] 2/9 02/01/2021 01/14/2021 11/17/2020  Decreased Interest 3 1 1   Down, Depressed, Hopeless 3 1 1   PHQ - 2 Score 6 2 2   Altered sleeping 3 2 2   Tired, decreased energy 3 2 2   Change in appetite 3 2 2   Feeling bad or  failure about yourself  0 1 1  Trouble concentrating 0 1 1  Moving slowly or fidgety/restless 0 1 1  Suicidal thoughts 0 0 0  PHQ-9 Score 15 11 11   Difficult doing work/chores Very difficult Somewhat difficult Somewhat difficult  Some recent data might be hidden   GAD 7 : Generalized Anxiety Score 02/01/2021 11/05/2020 10/05/2020 09/07/2020  Nervous, Anxious, on Edge 3 1 1 2   Control/stop worrying 3 1 2 3   Worry too much - different things 3 2 1 3   Trouble relaxing 3 1 2 2   Restless 0 1 1 1   Easily annoyed or irritable 3 2 1 1   Afraid - awful might happen 3 1 1 2   Total GAD 7 Score 18 9 9 14   Anxiety Difficulty Very difficult Somewhat difficult Somewhat difficult Somewhat difficult    Assessment/ Plan: 88 y.o. female   Sleep disorder - Plan: busPIRone (BUSPAR) 5 MG tablet  Generalized anxiety disorder - Plan: busPIRone (BUSPAR) 5 MG tablet  Depression, major, single episode, moderate (HCC) - Plan: busPIRone (BUSPAR) 5 MG tablet  Poor appetite - Plan: busPIRone (BUSPAR) 5 MG tablet  She will start BuSpar 5 mg twice daily.  I have encouraged her family to contact me should she feel she needs higher doses prior to her next visit in 1 month.  I had considered mirtazapine for her but this also had risk of QTC prolongation and therefore we did not pursue that medication.  Could consider trial back on Cymbalta, this is something that she thought she might try again if the BuSpar does not work  No orders of the defined types were placed in this encounter.  No orders of the defined types were placed in this encounter.    [name], DO [place] [phone]

## 2021-02-01 NOTE — Addendum Note (Signed)
Addended by: Ricci Barker on: 02/01/2021 02:33 PM   Modules accepted: Orders

## 2021-02-01 NOTE — Patient Instructions (Signed)
I considered mirtazapine but this affects your heart rhythm, so we are prescribing Buspirone instead.  You will take Buspirone twice daily.  We will plan to increase it at your next visit.

## 2021-02-03 ENCOUNTER — Telehealth: Payer: Self-pay | Admitting: Cardiovascular Disease

## 2021-02-03 ENCOUNTER — Encounter (HOSPITAL_COMMUNITY): Payer: Self-pay

## 2021-02-03 ENCOUNTER — Observation Stay (HOSPITAL_COMMUNITY): Payer: Medicare Other

## 2021-02-03 ENCOUNTER — Inpatient Hospital Stay (HOSPITAL_COMMUNITY)
Admission: EM | Admit: 2021-02-03 | Discharge: 2021-02-08 | DRG: 065 | Disposition: A | Discharge status: Skilled Nursing Facility | Payer: Medicare Other | Attending: Family Medicine | Admitting: Family Medicine

## 2021-02-03 ENCOUNTER — Telehealth: Payer: Self-pay | Admitting: Family Medicine

## 2021-02-03 ENCOUNTER — Emergency Department (HOSPITAL_COMMUNITY): Payer: Medicare Other

## 2021-02-03 ENCOUNTER — Other Ambulatory Visit: Payer: Self-pay

## 2021-02-03 DIAGNOSIS — Z86718 Personal history of other venous thrombosis and embolism: Secondary | ICD-10-CM

## 2021-02-03 DIAGNOSIS — K148 Other diseases of tongue: Secondary | ICD-10-CM | POA: Diagnosis present

## 2021-02-03 DIAGNOSIS — I129 Hypertensive chronic kidney disease with stage 1 through stage 4 chronic kidney disease, or unspecified chronic kidney disease: Secondary | ICD-10-CM | POA: Diagnosis present

## 2021-02-03 DIAGNOSIS — Z8049 Family history of malignant neoplasm of other genital organs: Secondary | ICD-10-CM

## 2021-02-03 DIAGNOSIS — I482 Chronic atrial fibrillation, unspecified: Secondary | ICD-10-CM

## 2021-02-03 DIAGNOSIS — Z66 Do not resuscitate: Secondary | ICD-10-CM | POA: Diagnosis present

## 2021-02-03 DIAGNOSIS — N1831 Chronic kidney disease, stage 3a: Secondary | ICD-10-CM | POA: Diagnosis present

## 2021-02-03 DIAGNOSIS — I6381 Other cerebral infarction due to occlusion or stenosis of small artery: Secondary | ICD-10-CM | POA: Diagnosis not present

## 2021-02-03 DIAGNOSIS — R29703 NIHSS score 3: Secondary | ICD-10-CM | POA: Diagnosis present

## 2021-02-03 DIAGNOSIS — I63531 Cerebral infarction due to unspecified occlusion or stenosis of right posterior cerebral artery: Principal | ICD-10-CM | POA: Diagnosis present

## 2021-02-03 DIAGNOSIS — I252 Old myocardial infarction: Secondary | ICD-10-CM

## 2021-02-03 DIAGNOSIS — N183 Chronic kidney disease, stage 3 unspecified: Secondary | ICD-10-CM | POA: Diagnosis present

## 2021-02-03 DIAGNOSIS — I639 Cerebral infarction, unspecified: Secondary | ICD-10-CM | POA: Diagnosis not present

## 2021-02-03 DIAGNOSIS — R079 Chest pain, unspecified: Secondary | ICD-10-CM | POA: Diagnosis not present

## 2021-02-03 DIAGNOSIS — Z20822 Contact with and (suspected) exposure to covid-19: Secondary | ICD-10-CM | POA: Diagnosis present

## 2021-02-03 DIAGNOSIS — E1122 Type 2 diabetes mellitus with diabetic chronic kidney disease: Secondary | ICD-10-CM | POA: Diagnosis present

## 2021-02-03 DIAGNOSIS — G459 Transient cerebral ischemic attack, unspecified: Secondary | ICD-10-CM | POA: Diagnosis present

## 2021-02-03 DIAGNOSIS — I2583 Coronary atherosclerosis due to lipid rich plaque: Secondary | ICD-10-CM | POA: Diagnosis not present

## 2021-02-03 DIAGNOSIS — Z4502 Encounter for adjustment and management of automatic implantable cardiac defibrillator: Secondary | ICD-10-CM

## 2021-02-03 DIAGNOSIS — I10 Essential (primary) hypertension: Secondary | ICD-10-CM | POA: Diagnosis not present

## 2021-02-03 DIAGNOSIS — I6501 Occlusion and stenosis of right vertebral artery: Secondary | ICD-10-CM | POA: Diagnosis not present

## 2021-02-03 DIAGNOSIS — G8194 Hemiplegia, unspecified affecting left nondominant side: Secondary | ICD-10-CM | POA: Diagnosis present

## 2021-02-03 DIAGNOSIS — K219 Gastro-esophageal reflux disease without esophagitis: Secondary | ICD-10-CM | POA: Diagnosis present

## 2021-02-03 DIAGNOSIS — Z833 Family history of diabetes mellitus: Secondary | ICD-10-CM

## 2021-02-03 DIAGNOSIS — E7800 Pure hypercholesterolemia, unspecified: Secondary | ICD-10-CM | POA: Diagnosis present

## 2021-02-03 DIAGNOSIS — Z95810 Presence of automatic (implantable) cardiac defibrillator: Secondary | ICD-10-CM

## 2021-02-03 DIAGNOSIS — Z7901 Long term (current) use of anticoagulants: Secondary | ICD-10-CM

## 2021-02-03 DIAGNOSIS — I2510 Atherosclerotic heart disease of native coronary artery without angina pectoris: Secondary | ICD-10-CM

## 2021-02-03 DIAGNOSIS — I6623 Occlusion and stenosis of bilateral posterior cerebral arteries: Secondary | ICD-10-CM | POA: Diagnosis not present

## 2021-02-03 DIAGNOSIS — R29810 Facial weakness: Secondary | ICD-10-CM | POA: Diagnosis present

## 2021-02-03 DIAGNOSIS — R0789 Other chest pain: Secondary | ICD-10-CM | POA: Diagnosis not present

## 2021-02-03 DIAGNOSIS — E119 Type 2 diabetes mellitus without complications: Secondary | ICD-10-CM

## 2021-02-03 DIAGNOSIS — Z823 Family history of stroke: Secondary | ICD-10-CM

## 2021-02-03 DIAGNOSIS — Z8249 Family history of ischemic heart disease and other diseases of the circulatory system: Secondary | ICD-10-CM

## 2021-02-03 DIAGNOSIS — E039 Hypothyroidism, unspecified: Secondary | ICD-10-CM | POA: Diagnosis not present

## 2021-02-03 DIAGNOSIS — R531 Weakness: Secondary | ICD-10-CM | POA: Diagnosis not present

## 2021-02-03 DIAGNOSIS — Z743 Need for continuous supervision: Secondary | ICD-10-CM | POA: Diagnosis not present

## 2021-02-03 LAB — COMPREHENSIVE METABOLIC PANEL
ALT: 16 U/L (ref 0–44)
AST: 27 U/L (ref 15–41)
Albumin: 3.7 g/dL (ref 3.5–5.0)
Alkaline Phosphatase: 67 U/L (ref 38–126)
Anion gap: 8 (ref 5–15)
BUN: 21 mg/dL (ref 8–23)
CO2: 24 mmol/L (ref 22–32)
Calcium: 9 mg/dL (ref 8.9–10.3)
Chloride: 105 mmol/L (ref 98–111)
Creatinine, Ser: 1.19 mg/dL — ABNORMAL HIGH (ref 0.44–1.00)
GFR, Estimated: 44 mL/min — ABNORMAL LOW (ref >60)
Glucose, Bld: 112 mg/dL — ABNORMAL HIGH (ref 70–99)
Potassium: 3.6 mmol/L (ref 3.5–5.1)
Sodium: 137 mmol/L (ref 135–145)
Total Bilirubin: 1.1 mg/dL (ref 0.3–1.2)
Total Protein: 6.9 g/dL (ref 6.5–8.1)

## 2021-02-03 LAB — RAPID URINE DRUG SCREEN, HOSP PERFORMED
Amphetamines: NOT DETECTED
Barbiturates: NOT DETECTED
Benzodiazepines: NOT DETECTED
Cocaine: NOT DETECTED
Opiates: NOT DETECTED
Tetrahydrocannabinol: NOT DETECTED

## 2021-02-03 LAB — URINALYSIS, ROUTINE W REFLEX MICROSCOPIC
Bilirubin Urine: NEGATIVE
Glucose, UA: NEGATIVE mg/dL
Ketones, ur: NEGATIVE mg/dL
Leukocytes,Ua: NEGATIVE
Nitrite: NEGATIVE
Protein, ur: NEGATIVE mg/dL
Specific Gravity, Urine: 1.008 (ref 1.005–1.030)
pH: 7 (ref 5.0–8.0)

## 2021-02-03 LAB — RESP PANEL BY RT-PCR (FLU A&B, COVID) ARPGX2
Influenza A by PCR: NEGATIVE
Influenza B by PCR: NEGATIVE
SARS Coronavirus 2 by RT PCR: NEGATIVE

## 2021-02-03 LAB — DIFFERENTIAL
Abs Immature Granulocytes: 0.03 10*3/uL (ref 0.00–0.07)
Basophils Absolute: 0.1 10*3/uL (ref 0.0–0.1)
Basophils Relative: 1 %
Eosinophils Absolute: 0.3 10*3/uL (ref 0.0–0.5)
Eosinophils Relative: 4 %
Immature Granulocytes: 1 %
Lymphocytes Relative: 30 %
Lymphs Abs: 2 10*3/uL (ref 0.7–4.0)
Monocytes Absolute: 0.7 10*3/uL (ref 0.1–1.0)
Monocytes Relative: 11 %
Neutro Abs: 3.5 10*3/uL (ref 1.7–7.7)
Neutrophils Relative %: 53 %

## 2021-02-03 LAB — APTT: aPTT: 29 seconds (ref 24–36)

## 2021-02-03 LAB — CBC
HCT: 36.1 % (ref 36.0–46.0)
Hemoglobin: 12.2 g/dL (ref 12.0–15.0)
MCH: 32.4 pg (ref 26.0–34.0)
MCHC: 33.8 g/dL (ref 30.0–36.0)
MCV: 96 fL (ref 80.0–100.0)
Platelets: 219 10*3/uL (ref 150–400)
RBC: 3.76 MIL/uL — ABNORMAL LOW (ref 3.87–5.11)
RDW: 15 % (ref 11.5–15.5)
WBC: 6.4 10*3/uL (ref 4.0–10.5)
nRBC: 0 % (ref 0.0–0.2)

## 2021-02-03 LAB — I-STAT CHEM 8, ED
BUN: 28 mg/dL — ABNORMAL HIGH (ref 8–23)
Calcium, Ion: 1.12 mmol/L — ABNORMAL LOW (ref 1.15–1.40)
Chloride: 106 mmol/L (ref 98–111)
Creatinine, Ser: 1.2 mg/dL — ABNORMAL HIGH (ref 0.44–1.00)
Glucose, Bld: 110 mg/dL — ABNORMAL HIGH (ref 70–99)
HCT: 39 % (ref 36.0–46.0)
Hemoglobin: 13.3 g/dL (ref 12.0–15.0)
Potassium: 4.4 mmol/L (ref 3.5–5.1)
Sodium: 138 mmol/L (ref 135–145)
TCO2: 26 mmol/L (ref 22–32)

## 2021-02-03 LAB — PROTIME-INR
INR: 1.1 (ref 0.8–1.2)
Prothrombin Time: 14.6 seconds (ref 11.4–15.2)

## 2021-02-03 LAB — ETHANOL: Alcohol, Ethyl (B): <10 mg/dL (ref <10)

## 2021-02-03 MED ORDER — LEVOTHYROXINE SODIUM 100 MCG PO TABS
100.0000 ug | ORAL_TABLET | Freq: Every day | ORAL | Status: DC
  Administered 2021-02-04 – 2021-02-08 (×5): 100 ug via ORAL
  Filled 2021-02-03 (×5): qty 1

## 2021-02-03 MED ORDER — VITAMIN D 25 MCG (1000 UNIT) PO TABS
2000.0000 [IU] | ORAL_TABLET | Freq: Every morning | ORAL | Status: DC
  Administered 2021-02-04 – 2021-02-08 (×5): 2000 [IU] via ORAL
  Filled 2021-02-03 (×4): qty 2

## 2021-02-03 MED ORDER — ROSUVASTATIN CALCIUM 10 MG PO TABS
10.0000 mg | ORAL_TABLET | Freq: Every day | ORAL | Status: DC
  Administered 2021-02-03 – 2021-02-08 (×6): 10 mg via ORAL
  Filled 2021-02-03 (×6): qty 1

## 2021-02-03 MED ORDER — IOHEXOL 350 MG/ML SOLN
75.0000 mL | Freq: Once | INTRAVENOUS | Status: AC | PRN
Start: 2021-02-03 — End: 2021-02-03
  Administered 2021-02-03: 75 mL via INTRAVENOUS

## 2021-02-03 MED ORDER — LORAZEPAM 2 MG/ML IJ SOLN
1.0000 mg | Freq: Once | INTRAMUSCULAR | Status: AC
  Administered 2021-02-03: 1 mg via INTRAVENOUS
  Filled 2021-02-03: qty 1

## 2021-02-03 MED ORDER — ACETAMINOPHEN 650 MG RE SUPP: 650.0000 mg | RECTAL | Status: DC | PRN

## 2021-02-03 MED ORDER — SENNOSIDES-DOCUSATE SODIUM 8.6-50 MG PO TABS: 1.0000 | ORAL_TABLET | Freq: Every evening | ORAL | Status: DC | PRN

## 2021-02-03 MED ORDER — PANTOPRAZOLE SODIUM 40 MG PO TBEC
40.0000 mg | DELAYED_RELEASE_TABLET | Freq: Every day | ORAL | Status: DC
  Administered 2021-02-03 – 2021-02-08 (×6): 40 mg via ORAL
  Filled 2021-02-03 (×6): qty 1

## 2021-02-03 MED ORDER — AMIODARONE HCL 200 MG PO TABS
200.0000 mg | ORAL_TABLET | Freq: Every day | ORAL | Status: DC
  Administered 2021-02-04 – 2021-02-08 (×5): 200 mg via ORAL
  Filled 2021-02-03 (×5): qty 1

## 2021-02-03 MED ORDER — ACETAMINOPHEN 325 MG PO TABS
650.0000 mg | ORAL_TABLET | ORAL | Status: DC | PRN
  Administered 2021-02-04 – 2021-02-07 (×5): 650 mg via ORAL
  Filled 2021-02-03 (×5): qty 2

## 2021-02-03 MED ORDER — HYDRALAZINE HCL 20 MG/ML IJ SOLN
5.0000 mg | INTRAMUSCULAR | Status: DC | PRN
  Administered 2021-02-03: 5 mg via INTRAVENOUS
  Filled 2021-02-03: qty 1

## 2021-02-03 MED ORDER — BUSPIRONE HCL 5 MG PO TABS
5.0000 mg | ORAL_TABLET | Freq: Two times a day (BID) | ORAL | Status: DC
  Administered 2021-02-03 – 2021-02-08 (×10): 5 mg via ORAL
  Filled 2021-02-03 (×10): qty 1

## 2021-02-03 MED ORDER — ACETAMINOPHEN 160 MG/5ML PO SOLN: 650.0000 mg | ORAL | Status: DC | PRN

## 2021-02-03 MED ORDER — ALPHA-LIPOIC ACID 600 MG PO TABS: 600.0000 mg | ORAL_TABLET | Freq: Every day | ORAL | Status: DC

## 2021-02-03 MED ORDER — STROKE: EARLY STAGES OF RECOVERY BOOK
Freq: Once | Status: DC
  Filled 2021-02-03: qty 1

## 2021-02-03 MED ORDER — ENSURE ENLIVE PO LIQD
237.0000 mL | Freq: Two times a day (BID) | ORAL | Status: DC
  Administered 2021-02-04 – 2021-02-08 (×7): 237 mL via ORAL
  Filled 2021-02-03 (×2): qty 237

## 2021-02-03 MED ORDER — SODIUM CHLORIDE 0.9 % IV SOLN: INTRAVENOUS | Status: DC

## 2021-02-03 MED ORDER — APIXABAN 2.5 MG PO TABS
2.5000 mg | ORAL_TABLET | Freq: Two times a day (BID) | ORAL | Status: DC
  Administered 2021-02-03 – 2021-02-08 (×10): 2.5 mg via ORAL
  Filled 2021-02-03 (×10): qty 1

## 2021-02-03 NOTE — Telephone Encounter (Signed)
Spoke with pt's daughter in law regarding pt was just taken by ambulance to [facility]. Daughter in law just wanted Dr. Sallyanne Kuster to know. Daughter in law is wondering is stress test that is scheduled on Monday is necessary. Will send to Dr. Sallyanne Kuster as Juluis Rainier.

## 2021-02-03 NOTE — ED Notes (Signed)
Attempted report x1. 

## 2021-02-03 NOTE — ED Notes (Signed)
Code Stroke 16:44 Paged out

## 2021-02-03 NOTE — ED Triage Notes (Signed)
Patient presents to the ED via EMS for left sided weakness and immobility.  EMS states that the patient had left leg weakness yesterday.  Was seen for her appointment today and returned home and had a new onset of immobility, weakness on the left side, and dizziness.    BP: 206/120  Patient is conscious and alert at triage.  Provider at bedside

## 2021-02-03 NOTE — Telephone Encounter (Signed)
I spoke to pt and she says last night her son was concerned that her speech sounded off. Pt also worried about the procedure she is having Monday with Cardiology. Pt is having numbness in her left leg but pt has known neuropathy and was recently taken off Gabapentin. Pt wanted to know if this could be coming from her cardiac issues. Advised pt she can call the cardiologist to address this if she has concerns regarding procedure and pt voiced understanding.

## 2021-02-03 NOTE — H&P (Signed)
TRH H&P   Patient Demographics:    [name], is a 88 y.o. female  MRN: [id number]   DOB - 02/12/1931  Admit Date - 02/03/2021  Outpatient Primary MD for the patient is [name], DO  Referring MD/NP/PA: Dr Sabra Heck    Patient coming from: home  Chief Complaint  Patient presents with   Code Stroke      HPI:    [name]  is a 88 y.o. female, with medical history significant for atrial fibrillation and DVT, on Eliquis, coronary artery disease with AICD, hypertension, hyperlipidemia, hypothyroidism. -Patient presents to ED secondary to weakness, presents from home, she was at urologist office because of history of kidney stones, was back into the house around 11:30, she was able to walk back into the house, 2 hours after she walked into the house around 1:30, she tried to get out of bed and walk, she could not stand up, and she felt her left leg is weak, and not feel right, the as well reports left-sided tingling and numbness yesterday when she woke up from sleep, she denies any loss of consciousness, vision changes, altered mental status, speech problems, she was noted to have left-sided weakness by EMS. -ED code stroke was called initially, but after discussing with neurology on-call at [facility] it was canceled given patient hypertensive, on Eliquis, and no significant focal deficits present while in ED, CT head with no acute findings, CTA head and neck with a small vessel disease, MRI could not be obtained due to ICD, [facility] hospitalist consulted to admit.    Review of systems:    In addition to the HPI above,   A full 10 point Review of Systems was done, except as stated above, all other Review of Systems were negative.   With Past History of the following :    Past Medical History:  Diagnosis Date   AICD (automatic cardioverter/defibrillator) present     Allergy    SESONAL   Aneurysm of infrarenal abdominal aorta    Anxiety    ARTHRITIS    Arthritis    Atrial fibrillation (HCC)    CAD (coronary artery disease)    a. history of cardiac arrest 1993;  b. s/p LAD/LCX stenting in 2003;  c. 07/2011 Cath: LM nl, LAD patent stent, D1 80ost, LCX patent stent, RCA min irregs;  d. 04/2012 MV: EF 66%, no ishcemia;  e. 12/2013 Echo: Ef 55%, no rwma, Gr 1 DD, triv AI/MR, mildly dil LA;  f. 12/2013 Lexi MV: intermediate risk - apical ischemia and inf/infsept fixed defect, ? artifactual.   CAD in native artery 12/31/2013   Previous stents to LAD and Ramus patent on cath today 12/31/13 also There is severe disease in the ostial first diagonal which is unchanged from most recent cardiac catheterization. The right coronary artery could not be engaged selectively but nonselective angiography showed  no significant disease in the proximal and midsegment.       Cataract    DENIES   Chronic back pain    Chronic sinus bradycardia    CKD (chronic kidney disease), stage III (HCC)    Clotting disorder ([place])    DVT   COLITIS 12/02/2007   Qualifier: Diagnosis of  By: [name], [name]     Diabetes mellitus without complication ([place])    DENIES   Diverticulosis of colon (without mention of hemorrhage) 2007   Colonoscopy    Esophageal stricture    a. 2012 s/p dil.   Esophagitis, unspecified    a. 2012 EGD   EXTERNAL HEMORRHOIDS    GERD (gastroesophageal reflux disease)    omeprazole   H/O hiatal hernia    Hiatal hernia    a. 2012 EGD.   History of DVT in the past, not on Coumadin now    left  leg   HYPERCHOLESTEROLEMIA    Hypertension    ICD (implantable cardiac defibrillator) in place    a. s/p initial ICD in 1993 in setting of cardiac arrest;  b. 01/2007 gen change: Guidant T135 Vitality DS VR single lead ICD.   Macular degeneration    gets injecton in eye every 5 weeks- last injection - 05/03/2013    Myocardial infarction [facility]) 1993   Near syncope  05/22/2015   Nephrolithiasis, just saw Dr [name]    Orthostatic hypotension    Peripheral vascular disease ([place])    ???   RECTAL BLEEDING 12/03/2007   Qualifier: Diagnosis of  By: [name]    Unspecified gastritis and gastroduodenitis without mention of hemorrhage    a. 2003 EGD->not noted on 2012 EGD.   Unstable angina, neg MI, cath stable.maybe GI 12/30/2013      Past Surgical History:  Procedure Laterality Date   BREAST BIOPSY Left 11/2013   CARDIAC CATHETERIZATION     CARDIAC CATHETERIZATION  01/01/15   patent stents -there is severe disease in ostial 1st diag but without change.    CARDIAC DEFIBRILLATOR PLACEMENT  02/05/2007   Guidant   CARDIOVASCULAR STRESS TEST  12/30/2013   abnormal   CARDIOVERSION N/A 02/13/2019   Procedure: CARDIOVERSION;  Surgeon: Sanda Klein, MD;  Location: [place];  Service: Cardiovascular;  Laterality: N/A;   CATARACT EXTRACTION Right    Dr. Katy Fitch   COLONOSCOPY     COLONOSCOPY WITH PROPOFOL N/A 03/30/2020   Procedure: COLONOSCOPY WITH PROPOFOL;  Surgeon: Harvel Quale, MD;  [facility];  Service: Gastroenterology;  Laterality: N/A;   coronary stents      CYSTOSCOPY WITH URETEROSCOPY Right 05/20/2013   Procedure: CYSTOSCOPY, RIGHT URETEROSCOPY STONE EXTRACTION, Insertion of right DOUBLE J STENT ;  Surgeon: Irine Seal, MD;  Location: [facility];  Service: Urology;  Laterality: Right;   CYSTOSCOPY WITH URETEROSCOPY AND STENT PLACEMENT N/A 06/03/2013   Procedure: SECOND LOOK CYSTOSCOPY WITH URETEROSCOPY  HOLMIUM LASER LITHO AND STONE EXTRACTION Sammie Bench ;  Surgeon: Malka So, MD;  Location: [facility];  Service: Urology;  Laterality: N/A;   ESOPHAGOGASTRODUODENOSCOPY (EGD) WITH PROPOFOL N/A 11/20/2019   Procedure: ESOPHAGOGASTRODUODENOSCOPY (EGD) WITH PROPOFOL;  Surgeon: Daneil Dolin, MD;  [facility];  Service: Endoscopy;  Laterality: N/A;   GIVENS CAPSULE STUDY N/A 10/30/2016   Procedure:  GIVENS CAPSULE STUDY;  Surgeon: Irene Shipper, MD;  Location[facility] ENDOSCOPY;  Service: Endoscopy;  Laterality: N/A;  NEEDS TO BE ADMITTED FOR OBSERVATION  PT. HAS DEFIB   HEMORROIDECTOMY  80's   HOLMIUM LASER APPLICATION Right 06/23/3417   Procedure: HOLMIUM LASER APPLICATION;  Surgeon: Irine Seal, MD;  Location: [facility];  Service: Urology;  Laterality: Right;   HYSTEROSCOPY WITH D & C  09/02/2010   Procedure: DILATATION AND CURETTAGE (D&C) /HYSTEROSCOPY;  Surgeon: Margarette Asal;  Location: [facility];  Service: Gynecology;  Laterality: N/A;  Dilation and Curettage with Hysteroscopy and Polypectomy   IMPLANTABLE CARDIOVERTER DEFIBRILLATOR (ICD) GENERATOR CHANGE N/A 02/10/2014   Procedure: ICD GENERATOR CHANGE;  Surgeon: Sanda Klein, MD;  Location: [place] CATH LAB;  Service: Cardiovascular;  Laterality: N/A;   LEFT HEART CATHETERIZATION WITH CORONARY ANGIOGRAM N/A 07/28/2011   Procedure: LEFT HEART CATHETERIZATION WITH CORONARY ANGIOGRAM;  Surgeon: Lorretta Harp, MD;  [facility] C[facility] LAB;  Service: Cardiovascular;  Laterality: N/A;   LEFT HEART CATHETERIZATION WITH CORONARY ANGIOGRAM N/A 12/31/2013   Procedure: LEFT HEART CATHETERIZATION WITH CORONARY ANGIOGRAM;  Surgeon: Wellington Hampshire, MD;  Location: [place] CATH LAB;  Service: Cardiovascular;  Laterality: N/A;      Social History:     Social History   Tobacco Use   Smoking status: Never   Smokeless tobacco: Never  Substance Use Topics   Alcohol use: No       Family History :     Family History  Problem Relation Age of Onset   Stroke Mother    Other Mother        brain tumor   Other Father        MI   Heart attack Father    Stroke Sister    Macular degeneration Sister    Diabetes Daughter    Cancer Daughter        ovarian   Colon polyps Daughter    Atrial fibrillation Sister    Hyperlipidemia Sister    Osteoporosis Sister    Stroke Sister    Uterine cancer Sister    Heart attack Brother    Heart disease Brother    Asthma  Brother    Breast cancer Neg Hx       Home Medications:   Prior to Admission medications   Medication Sig Start Date End Date Taking? Authorizing Provider  acetaminophen (TYLENOL) 500 MG tablet Take 1 tablet (500 mg total) by mouth every 6 (six) hours as needed for mild pain, fever or headache. 04/05/20  Yes Johnson, Clanford L, MD  Alpha-Lipoic Acid 600 MG TABS Take 600 mg by mouth daily. For neuropathy in legs 08/18/20  Yes Ronnie Doss M, DO  amiodarone (PACERONE) 200 MG tablet Take 1 tablet (200 mg total) by mouth daily. 10/04/20  Yes Croitoru, Mihai, MD  busPIRone (BUSPAR) 5 MG tablet Take 1 tablet (5 mg total) by mouth 2 (two) times daily. 02/01/21  Yes Gottschalk, Leatrice Jewels M, DO  carboxymethylcellulose (REFRESH PLUS) 0.5 % SOLN Place 1 drop into both eyes 3 (three) times daily as needed (dry/irritated eyes.).   Yes [provider]  Cholecalciferol (VITAMIN D) 50 MCG (2000 UT) CAPS Take 2,000 Units by mouth every morning.   Yes [provider]  ELIQUIS 2.5 MG TABS tablet TAKE  (1)  TABLET TWICE A DAY. Patient taking differently: Take 2.5 mg by mouth 2 (two) times daily. 08/23/20  Yes Croitoru, Mihai, MD  feeding supplement (ENSURE ENLIVE / ENSURE PLUS) LIQD Take 237 mLs by mouth 2 (two) times daily between meals. 12/26/19  Yes Mikhail, Maryann, DO  furosemide (LASIX) 20 MG tablet Take 20  mg by mouth 3 (three) times a week.   Yes [provider]  gabapentin (NEURONTIN) 100 MG capsule Take 1-3 capsules (100-300 mg total) by mouth at bedtime as needed. 11/05/20  Yes Ronnie Doss M, DO  isosorbide mononitrate (IMDUR) 60 MG 24 hr tablet Take 1 tablet (60 mg total) by mouth daily. 01/31/21  Yes Croitoru, Mihai, MD  levothyroxine (SYNTHROID) 100 MCG tablet Take 1 tablet (100 mcg total) by mouth every morning. 10/19/20  Yes Gottschalk, Ashly M, DO  MELATONIN PO Take 3 mg by mouth at bedtime.   Yes [provider]  metoprolol succinate (TOPROL-XL) 25 MG 24 hr  tablet Take 0.5 tablets (12.5 mg total) by mouth daily. Take with or immediately following a meal. Patient taking differently: Take 12.5 mg by mouth 2 (two) times daily. Take with or immediately following a meal. 01/31/21 05/01/21 Yes Croitoru, Mihai, MD  Multiple Vitamins-Minerals (CENTRUM SILVER ULTRA WOMENS PO) Take 1 tablet by mouth every morning.   Yes [provider]  nitroGLYCERIN (NITROSTAT) 0.4 MG SL tablet PLACE 1 TABLET UNDER THE TONGUE AT ONSET OF CHEST PAIN EVERY 5 MINTUES UP TO 3 TIMES AS NEEDED Patient taking differently: Place 0.4 mg under the tongue every 5 (five) minutes as needed. 01/05/21  Yes Stacks, Cletus Gash, MD  pantoprazole (PROTONIX) 40 MG tablet TAKE 1 TABLET ONCE DAILY 11/22/20  Yes Ronnie Doss M, DO  potassium chloride SA (KLOR-CON) 20 MEQ tablet Take 20 mEq by mouth 3 (three) times a week.   Yes [provider]  rosuvastatin (CRESTOR) 10 MG tablet TAKE 1 TABLET DAILY 11/22/20  Yes Ronnie Doss M, DO     Allergies:     Allergies  Allergen Reactions   Sotalol Other (See Comments)    Torsades    Tramadol Other (See Comments)    Hallucination, bad-dream   Actos [Pioglitazone] Swelling   Adhesive [Tape] Other (See Comments)    Causes sores   Atorvastatin Other (See Comments)    myalgia   Bactrim [Sulfamethoxazole-Trimethoprim] Itching and Rash    Itchy rash   Ciprofloxacin Nausea Only   Contrast Media [Iodinated Contrast Media] Other (See Comments)    Headache (no action/pre-med required)   Latex Rash   Pedi-Pre Tape Spray [Wound Dressing Adhesive] Other (See Comments)    Causes sores   Sulfa Antibiotics Other (See Comments)    Causes sores     Physical Exam:   Vitals  Blood pressure (!) 194/90, pulse 60, temperature 98 F (36.7 C), temperature source Oral, resp. rate 19, height 5\' 3"  (1.6 m), weight 54.4 kg, SpO2 97 %.   1. General *  2. Normal affect and insight, Not Suicidal or Homicidal, Awake Alert, Oriented X 3.  3.   She is noted with mild left facial droop, and minimal right tongue deviation, Strength 5/5 all 4 extremities, Sensation intact all 4 extremities, Plantars down going.  4. Ears and Eyes appear Normal, Conjunctivae clear, PERRLA. Moist Oral Mucosa.  5. Supple Neck, No JVD, No cervical lymphadenopathy appriciated, No Carotid Bruits.  6. Symmetrical Chest wall movement, Good air movement bilaterally, CTAB.  7. RRR, No Gallops, Rubs or Murmurs, No Parasternal Heave.  8. Positive Bowel Sounds, Abdomen Soft, No tenderness, No organomegaly appriciated,No rebound -guarding or rigidity.  9.  No Cyanosis, Normal Skin Turgor, No Skin Rash or Bruise.  10. Good muscle tone,  joints appear normal , no effusions, Normal ROM.  11. No Palpable Lymph Nodes in Neck or Axillae  Data Review:    CBC Recent Labs  Lab 02/03/21 1645  WBC 6.4  HGB 12.2   13.3  HCT 36.1   39.0  PLT 219  MCV 96.0  MCH 32.4  MCHC 33.8  RDW 15.0  LYMPHSABS 2.0  MONOABS 0.7  EOSABS 0.3  BASOSABS 0.1   ------------------------------------------------------------------------------------------------------------------  Chemistries  Recent Labs  Lab 02/03/21 1645  NA 137   138  K 3.6   4.4  CL 105   106  CO2 24  GLUCOSE 112*   110*  BUN 21   28*  CREATININE 1.19*   1.20*  CALCIUM 9.0  AST 27  ALT 16  ALKPHOS 67  BILITOT 1.1   ------------------------------------------------------------------------------------------------------------------ estimated creatinine clearance is 27.3 mL/min (A) (by C-G formula based on SCr of 1.2 mg/dL (H)). ------------------------------------------------------------------------------------------------------------------ No results for input(s): TSH, T4TOTAL, T3FREE, THYROIDAB in the last 72 hours.  Invalid input(s): FREET3  Coagulation profile Recent Labs  Lab 02/03/21 1724  INR 1.1    ------------------------------------------------------------------------------------------------------------------- No results for input(s): DDIMER in the last 72 hours. -------------------------------------------------------------------------------------------------------------------  Cardiac Enzymes No results for input(s): CKMB, TROPONINI, MYOGLOBIN in the last 168 hours.  Invalid input(s): CK ------------------------------------------------------------------------------------------------------------------    Component Value Date/Time   BNP 109.0 (H) 11/14/2019 2157     ---------------------------------------------------------------------------------------------------------------  Urinalysis    Component Value Date/Time   COLORURINE COLORLESS (A) 02/03/2021 1804   APPEARANCEUR CLEAR 02/03/2021 1804   APPEARANCEUR Cloudy (A) 09/07/2020 1213   LABSPEC 1.008 02/03/2021 1804   PHURINE 7.0 02/03/2021 1804   GLUCOSEU NEGATIVE 02/03/2021 1804   HGBUR SMALL (A) 02/03/2021 1804   BILIRUBINUR NEGATIVE 02/03/2021 1804   BILIRUBINUR Negative 09/07/2020 1213   KETONESUR NEGATIVE 02/03/2021 1804   PROTEINUR NEGATIVE 02/03/2021 1804   UROBILINOGEN negative 03/15/2015 1352   UROBILINOGEN 0.2 07/25/2011 2213   NITRITE NEGATIVE 02/03/2021 1804   LEUKOCYTESUR NEGATIVE 02/03/2021 1804    ----------------------------------------------------------------------------------------------------------------   Imaging Results:    CT Angio Head W or Wo Contrast  Result Date: 02/03/2021 CLINICAL DATA:  Left-sided weakness, stroke suspected EXAM: CT ANGIOGRAPHY HEAD AND NECK TECHNIQUE: Multidetector CT imaging of the head and neck was performed using the standard protocol during bolus administration of intravenous contrast. Multiplanar CT image reconstructions and MIPs were obtained to evaluate the vascular anatomy. Carotid stenosis measurements (when applicable) are obtained utilizing NASCET  criteria, using the distal internal carotid diameter as the denominator. RADIATION DOSE REDUCTION: This exam was performed according to the departmental dose-optimization program which includes automated exposure control, adjustment of the mA and/or kV according to patient size and/or use of iterative reconstruction technique. CONTRAST:  9mL OMNIPAQUE IOHEXOL 350 MG/ML SOLN COMPARISON:  No prior CTA, correlation is made with 02/03/2021 CT head FINDINGS: CT HEAD FINDINGS For noncontrast findings, please see same day CT head. CTA NECK FINDINGS Aortic arch: Standard branching. Imaged portion shows no evidence of aneurysm or dissection. No significant stenosis of the major arch vessel origins. Aortic atherosclerosis. Right carotid system: No evidence of dissection, stenosis (50% or greater) or occlusion. Left carotid system: No evidence of dissection, stenosis (50% or greater) or occlusion. Vertebral arteries: Codominant. Calcifications at the origin of the right vertebral artery, which cause moderate stenosis; the right vertebral artery is otherwise patent. No evidence of dissection or occlusion. No other evidence of significant stenosis. Skeleton: No acute osseous abnormality. Other neck: Negative. Upper chest: Mild interlobular septal thickening and ground-glass opacities, possibly mild pulmonary edema. No pleural effusion. Review of the MIP images confirms the above  findings CTA HEAD FINDINGS Anterior circulation: Both internal carotid arteries are patent to the termini, without mild calcifications but without significant stenosis A1 segments patent. Normal anterior communicating artery. Anterior cerebral arteries are patent to their distal aspects. No M1 stenosis or occlusion. Normal MCA bifurcations. Distal MCA branches perfused and symmetric. Posterior circulation: Vertebral arteries patent to the vertebrobasilar junction without stenosis. Posterior inferior cerebral arteries patent bilaterally. Basilar patent  to its distal aspect. Superior cerebellar arteries patent bilaterally. Bilateral P1 segments originate from the basilar artery. Multifocal narrowing in the bilateral P2 and P3 segments (series 505, images 216 and 215 on the on the right, and images 201, 215 and 225 on the left). Poor opacification of the left P3 segments distally, although evaluation is limited by venous contamination. Bilateral posterior communicating arteries are not visualized. Venous sinuses: As permitted by contrast timing, patent. Anatomic variants: None significant Review of the MIP images confirms the above findings IMPRESSION: 1. Multifocal narrowing in the bilateral P2 and P3 segments, with poor opacification of the distal P3 segments. No other significant intracranial stenosis. 2. Moderate narrowing at the origin of the right vertebral artery, which is otherwise patent. No other hemodynamically significant stenosis in the neck. These results were called by telephone at the time of interpretation on 02/03/2021 at 6:29 pm to provider [facility] , who verbally acknowledged these results. Electronically Signed   By: Merilyn Baba M.D.   On: 02/03/2021 18:30   CT Angio Neck W and/or Wo Contrast  Result Date: 02/03/2021 CLINICAL DATA:  Left-sided weakness, stroke suspected EXAM: CT ANGIOGRAPHY HEAD AND NECK TECHNIQUE: Multidetector CT imaging of the head and neck was performed using the standard protocol during bolus administration of intravenous contrast. Multiplanar CT image reconstructions and MIPs were obtained to evaluate the vascular anatomy. Carotid stenosis measurements (when applicable) are obtained utilizing NASCET criteria, using the distal internal carotid diameter as the denominator. RADIATION DOSE REDUCTION: This exam was performed according to the departmental dose-optimization program which includes automated exposure control, adjustment of the mA and/or kV according to patient size and/or use of iterative reconstruction  technique. CONTRAST:  91mL OMNIPAQUE IOHEXOL 350 MG/ML SOLN COMPARISON:  No prior CTA, correlation is made with 02/03/2021 CT head FINDINGS: CT HEAD FINDINGS For noncontrast findings, please see same day CT head. CTA NECK FINDINGS Aortic arch: Standard branching. Imaged portion shows no evidence of aneurysm or dissection. No significant stenosis of the major arch vessel origins. Aortic atherosclerosis. Right carotid system: No evidence of dissection, stenosis (50% or greater) or occlusion. Left carotid system: No evidence of dissection, stenosis (50% or greater) or occlusion. Vertebral arteries: Codominant. Calcifications at the origin of the right vertebral artery, which cause moderate stenosis; the right vertebral artery is otherwise patent. No evidence of dissection or occlusion. No other evidence of significant stenosis. Skeleton: No acute osseous abnormality. Other neck: Negative. Upper chest: Mild interlobular septal thickening and ground-glass opacities, possibly mild pulmonary edema. No pleural effusion. Review of the MIP images confirms the above findings CTA HEAD FINDINGS Anterior circulation: Both internal carotid arteries are patent to the termini, without mild calcifications but without significant stenosis A1 segments patent. Normal anterior communicating artery. Anterior cerebral arteries are patent to their distal aspects. No M1 stenosis or occlusion. Normal MCA bifurcations. Distal MCA branches perfused and symmetric. Posterior circulation: Vertebral arteries patent to the vertebrobasilar junction without stenosis. Posterior inferior cerebral arteries patent bilaterally. Basilar patent to its distal aspect. Superior cerebellar arteries patent bilaterally. Bilateral P1 segments originate from  the basilar artery. Multifocal narrowing in the bilateral P2 and P3 segments (series 505, images 216 and 215 on the on the right, and images 201, 215 and 225 on the left). Poor opacification of the left P3  segments distally, although evaluation is limited by venous contamination. Bilateral posterior communicating arteries are not visualized. Venous sinuses: As permitted by contrast timing, patent. Anatomic variants: None significant Review of the MIP images confirms the above findings IMPRESSION: 1. Multifocal narrowing in the bilateral P2 and P3 segments, with poor opacification of the distal P3 segments. No other significant intracranial stenosis. 2. Moderate narrowing at the origin of the right vertebral artery, which is otherwise patent. No other hemodynamically significant stenosis in the neck. These results were called by telephone at the time of interpretation on 02/03/2021 at 6:29 pm to provider [facility] , who verbally acknowledged these results. Electronically Signed   By: Merilyn Baba M.D.   On: 02/03/2021 18:30   CT HEAD CODE STROKE WO CONTRAST  Result Date: 02/03/2021 CLINICAL DATA:  Code stroke. EXAM: CT HEAD WITHOUT CONTRAST TECHNIQUE: Contiguous axial images were obtained from the base of the skull through the vertex without intravenous contrast. RADIATION DOSE REDUCTION: This exam was performed according to the departmental dose-optimization program which includes automated exposure control, adjustment of the mA and/or kV according to patient size and/or use of iterative reconstruction technique. COMPARISON:  CT head 08/28/2018 FINDINGS: Brain: There is no evidence of acute intracranial hemorrhage, extra-axial fluid collection, or acute infarct. There is a background of mild global parenchymal volume loss with prominence of the ventricular system and extra-axial CSF spaces. There is hypodensity in the left thalamus which is increased in conspicuity compared to the study from 2020 but is favored to reflect a remote lacunar infarct. Additional hypodensity in the subcortical and periventricular white matter likely reflects sequela of moderate chronic white matter microangiopathy. There is no mass  lesion.  There is no midline shift. Vascular: There is calcification of the bilateral cavernous ICAs. Skull: Normal. Negative for fracture or focal lesion. Sinuses/Orbits: The imaged paranasal sinuses are clear. A right lens implant is noted. The globes and orbits are otherwise unremarkable. Other: None. ASPECTS [facility] Stroke Program Early CT Score) - Ganglionic level infarction (caudate, lentiform nuclei, internal capsule, insula, M1-M3 cortex): 7 - Supraganglionic infarction (M4-M6 cortex): 3 Total score (0-10 with 10 being normal): 10 IMPRESSION: 1. No evidence of acute intracranial hemorrhage or infarct. 2. ASPECTS is 10 3. Small remote lacunar infarct in the left thalamus appears new since 2020. 4. Background of mild global parenchymal volume loss and moderate chronic white matter microangiopathy. These results were called by telephone at the time of interpretation on 02/03/2021 at 4:56 pm to provider [facility] , who verbally acknowledged these results. Electronically Signed   By: Valetta Mole M.D.   On: 02/03/2021 16:59    My personal review of EKG: Rhythm NSR, Rate  60 /min, QTc 500 ,LBBB(old)   Assessment & Plan:    Principal Problem:   TIA (transient ischemic attack) Active Problems:   CKD (chronic kidney disease), stage III (HCC)   ICD (implantable cardioverter-defibrillator) battery depletion   Diabetes mellitus type 2, diet-controlled ([place])   Essential hypertension   GERD (gastroesophageal reflux disease)   Atrial fibrillation, chronic (HCC)   CAD (coronary artery disease)   Hypothyroidism   Acute CVA vs TIA -She presents with left-sided tingling/numbness, and left-sided weakness, she is noted to have left facial droop on physical exam and left  tongue deviation, CT head with no acute findings CTA head/neck significant for small vessel disease. -Stroke was called in ED, ED physician discussed with neurology at [facility] on-call, she does not a candidate for tPA given she is on  Eliquis, hypertensive, and no signs of acute CVA on CT scan with findings that do not support a large vessel occlusion. -She admitted under CVA protocol, obtain 2D echo, lipid panel, and A1c, she cannot have an MRI given she has AICD noncompatible with MRI as discussed with MRI technician as initial AICD was 1993> with redo in 2008. -Continue with Eliquis -Consult PT/OT/SLP -Routine neurology consult -For permissive hypertension  CKD stage III -Renal function at baseline, continue to monitor  Chronic A. fib -On Eliquis -Continue with amiodarone for heart rate control, resume back on Toprol-XL once permissive hypertension is recommended no more  History of DVT -On Eliquis  CAD -She denies any chest pain or shortness of breath currently -Continue with statin, resume metoprolol when stable  GERD -Continue with PPI  Hyperlipidemia -Continue with statin, check lipid panel  Hypothyroidism -Continue with Synthroid     DVT Prophylaxis Eliquis  AM Labs Ordered, also please review Full Orders  Family Communication: Admission, patients condition and plan of care including tests being ordered have been discussed with the patient who indicate understanding and agree with the plan and Code Status.  Code Status DNR, presents with DNR form, it was confirmed by the patient.  Likely DC to  home  Condition GUARDED    Consults called: neuro requested in EPIC    Admission status: observation    Time spent in minutes : 65 minutes   Phillips Climes M.D on 02/03/2021 at 7:20 PM   [facility] - Office  [phone]

## 2021-02-03 NOTE — Progress Notes (Signed)
Code stroke 1642 call time 6394 exam started 1647 exam finished 3200 exams sent to [facility] 1649 exam completed in EPIC 1649 [facility] radiology called

## 2021-02-03 NOTE — ED Provider Notes (Signed)
[facility] Provider Note   CSN: [id number] Arrival date & time: 02/03/21  1638     History  No chief complaint on file.   [name] is a 88 y.o. female.  HPI  This patient is an 88 year old female, history of hypertension high cholesterol and has Eliquis twice a day.  She has a DO NOT RESUSCITATE order.  She presents from her home, she was at the kidney doctors office today because of a history of a kidney stone, she was able to walk back into the house however at 330, 2 hours after she walked into the house at 130 she tried to get out of bed and walk and could not stating that her left leg did not feel right.  She does recall that yesterday she woke up from sleep and had some numbness in the leg, she was able to walk but now states that she is having difficulty walking and did not want to.  No changes in vision, no changes in speech, paramedics noted some left-sided possible weakness, last seen normal was at 130 today with regards to weakness and over 36 hours ago with regards to numbness.  Home Medications Prior to Admission medications   Medication Sig Start Date End Date Taking? Authorizing Provider  acetaminophen (TYLENOL) 500 MG tablet Take 1 tablet (500 mg total) by mouth every 6 (six) hours as needed for mild pain, fever or headache. Patient not taking: Reported on 01/31/2021 04/05/20   Murlean Iba, MD  Alpha-Lipoic Acid 600 MG TABS Take 600 mg by mouth daily. For neuropathy in legs 08/18/20   Ronnie Doss M, DO  amiodarone (PACERONE) 200 MG tablet Take 1 tablet (200 mg total) by mouth daily. 10/04/20   Croitoru, Mihai, MD  busPIRone (BUSPAR) 5 MG tablet Take 1 tablet (5 mg total) by mouth 2 (two) times daily. 02/01/21   [name], DO  carboxymethylcellulose (REFRESH PLUS) 0.5 % SOLN Place 1 drop into both eyes 3 (three) times daily as needed (dry/irritated eyes.).    [provider]  Cholecalciferol (VITAMIN D) 50 MCG (2000  UT) CAPS Take 2,000 Units by mouth every morning.    [provider]  ELIQUIS 2.5 MG TABS tablet TAKE  (1)  TABLET TWICE A DAY. Patient taking differently: Take 2.5 mg by mouth 2 (two) times daily. 08/23/20   Croitoru, Mihai, MD  feeding supplement (ENSURE ENLIVE / ENSURE PLUS) LIQD Take 237 mLs by mouth 2 (two) times daily between meals. 12/26/19   Mikhail, Velta Addison, DO  furosemide (LASIX) 20 MG tablet Take 20 mg by mouth 3 (three) times a week.    [provider]  gabapentin (NEURONTIN) 100 MG capsule Take 1-3 capsules (100-300 mg total) by mouth at bedtime as needed. 11/05/20   [name], DO  isosorbide mononitrate (IMDUR) 60 MG 24 hr tablet Take 1 tablet (60 mg total) by mouth daily. 01/31/21   Croitoru, Mihai, MD  levothyroxine (SYNTHROID) 100 MCG tablet Take 1 tablet (100 mcg total) by mouth every morning. 10/19/20   Ronnie Doss M, DO  MELATONIN PO Take 3 mg by mouth at bedtime.    [provider]  metoprolol succinate (TOPROL-XL) 25 MG 24 hr tablet Take 0.5 tablets (12.5 mg total) by mouth daily. Take with or immediately following a meal. 01/31/21 05/01/21  Croitoru, Mihai, MD  Multiple Vitamins-Minerals (CENTRUM SILVER ULTRA WOMENS PO) Take 1 tablet by mouth every morning.    [provider]  nitroGLYCERIN (NITROSTAT) 0.4 MG SL tablet PLACE 1 TABLET UNDER THE TONGUE AT ONSET OF CHEST PAIN EVERY 5 MINTUES UP TO 3 TIMES AS NEEDED Patient not taking: Reported on 01/31/2021 01/05/21   Claretta Fraise, MD  pantoprazole (PROTONIX) 40 MG tablet TAKE 1 TABLET ONCE DAILY 11/22/20   Ronnie Doss M, DO  potassium chloride SA (KLOR-CON) 20 MEQ tablet Take 20 mEq by mouth 3 (three) times a week.    [provider]  rosuvastatin (CRESTOR) 10 MG tablet TAKE 1 TABLET DAILY 11/22/20   Ronnie Doss M, DO      Allergies    Sotalol, Tramadol, Actos [pioglitazone], Adhesive [tape], Atorvastatin, Bactrim [sulfamethoxazole-trimethoprim], Ciprofloxacin,  Contrast media [iodinated contrast media], Latex, Pedi-pre tape spray [wound dressing adhesive], and Sulfa antibiotics    Review of Systems   Review of Systems  All other systems reviewed and are negative.  Physical Exam Updated Vital Signs There were no vitals taken for this visit. Physical Exam Vitals and nursing note reviewed.  Constitutional:      General: She is not in acute distress.    Appearance: She is well-developed.  HENT:     Head: Normocephalic and atraumatic.     Mouth/Throat:     Pharynx: No oropharyngeal exudate.  Eyes:     General: No scleral icterus.       Right eye: No discharge.        Left eye: No discharge.     Conjunctiva/sclera: Conjunctivae normal.     Pupils: Pupils are equal, round, and reactive to light.  Neck:     Thyroid: No thyromegaly.     Vascular: No JVD.  Cardiovascular:     Rate and Rhythm: Normal rate and regular rhythm.     Heart sounds: Normal heart sounds. No murmur heard.   No friction rub. No gallop.  Pulmonary:     Effort: Pulmonary effort is normal. No respiratory distress.     Breath sounds: Normal breath sounds. No wheezing or rales.  Abdominal:     General: Bowel sounds are normal. There is no distension.     Palpations: Abdomen is soft. There is no mass.     Tenderness: There is no abdominal tenderness.  Musculoskeletal:        General: No tenderness. Normal range of motion.     Cervical back: Normal range of motion and neck supple.  Lymphadenopathy:     Cervical: No cervical adenopathy.  Skin:    General: Skin is warm and dry.     Findings: No erythema or rash.  Neurological:     Mental Status: She is alert.     Coordination: Coordination normal.     Comments: Tremor present, left sided facial droop present, L sided numbness of face, arm and leg present.  Speech clear.,  Peripheral visual fields normal, extraocular movements normal  Psychiatric:        Behavior: Behavior normal.    ED Results / Procedures /  Treatments   Labs (all labs ordered are listed, but only abnormal results are displayed) Labs Reviewed  CBC - Abnormal; Notable for the following components:      Result Value   RBC 3.76 (*)    All other components within normal limits  COMPREHENSIVE METABOLIC PANEL - Abnormal; Notable for the following components:   Glucose, Bld 112 (*)    Creatinine, Ser 1.19 (*)    GFR, Estimated 44 (*)    All other components within normal limits  URINALYSIS, ROUTINE  W REFLEX MICROSCOPIC - Abnormal; Notable for the following components:   Color, Urine COLORLESS (*)    Hgb urine dipstick SMALL (*)    Bacteria, UA RARE (*)    All other components within normal limits  I-STAT CHEM 8, ED - Abnormal; Notable for the following components:   BUN 28 (*)    Creatinine, Ser 1.20 (*)    Glucose, Bld 110 (*)    Calcium, Ion 1.12 (*)    All other components within normal limits  RESP PANEL BY RT-PCR (FLU A&B, COVID) ARPGX2  ETHANOL  DIFFERENTIAL  PROTIME-INR  APTT  RAPID URINE DRUG SCREEN, HOSP PERFORMED  HEMOGLOBIN A1C  LIPID PANEL    EKG None  Radiology CT HEAD CODE STROKE WO CONTRAST  Result Date: 02/03/2021 CLINICAL DATA:  Code stroke. EXAM: CT HEAD WITHOUT CONTRAST TECHNIQUE: Contiguous axial images were obtained from the base of the skull through the vertex without intravenous contrast. RADIATION DOSE REDUCTION: This exam was performed according to the departmental dose-optimization program which includes automated exposure control, adjustment of the mA and/or kV according to patient size and/or use of iterative reconstruction technique. COMPARISON:  CT head 08/28/2018 FINDINGS: Brain: There is no evidence of acute intracranial hemorrhage, extra-axial fluid collection, or acute infarct. There is a background of mild global parenchymal volume loss with prominence of the ventricular system and extra-axial CSF spaces. There is hypodensity in the left thalamus which is increased in conspicuity  compared to the study from 2020 but is favored to reflect a remote lacunar infarct. Additional hypodensity in the subcortical and periventricular white matter likely reflects sequela of moderate chronic white matter microangiopathy. There is no mass lesion.  There is no midline shift. Vascular: There is calcification of the bilateral cavernous ICAs. Skull: Normal. Negative for fracture or focal lesion. Sinuses/Orbits: The imaged paranasal sinuses are clear. A right lens implant is noted. The globes and orbits are otherwise unremarkable. Other: None. ASPECTS [facility] Stroke Program Early CT Score) - Ganglionic level infarction (caudate, lentiform nuclei, internal capsule, insula, M1-M3 cortex): 7 - Supraganglionic infarction (M4-M6 cortex): 3 Total score (0-10 with 10 being normal): 10 IMPRESSION: 1. No evidence of acute intracranial hemorrhage or infarct. 2. ASPECTS is 10 3. Small remote lacunar infarct in the left thalamus appears new since 2020. 4. Background of mild global parenchymal volume loss and moderate chronic white matter microangiopathy. These results were called by telephone at the time of interpretation on 02/03/2021 at 4:56 pm to provider [facility] , who verbally acknowledged these results. Electronically Signed   By: Valetta Mole M.D.   On: 02/03/2021 16:59    Procedures Procedures    Medications Ordered in ED Medications   stroke: mapping our early stages of recovery book (has no administration in time range)  0.9 %  sodium chloride infusion (has no administration in time range)  acetaminophen (TYLENOL) tablet 650 mg (has no administration in time range)    Or  acetaminophen (TYLENOL) 160 MG/5ML solution 650 mg (has no administration in time range)    Or  acetaminophen (TYLENOL) suppository 650 mg (has no administration in time range)  senna-docusate (Senokot-S) tablet 1 tablet (has no administration in time range)  iohexol (OMNIPAQUE) 350 MG/ML injection 75 mL (75 mLs Intravenous  Contrast Given 02/03/21 1654)    ED Course/ Medical Decision Making/ A&P                           Medical  Decision Making This patient was noted to be severely hypertensive to the paramedics, over 161 systolic.  No meds given prehospital.  She was transported immediately as a code stroke.  Due to the worsening of her symptoms within the last 2 hours the code stroke was activated, neurology will seen, CT scans and CT angiograms ordered, I did not get much in the way of weakness on her exam but she does have left-sided numbness thus this may not be a true code stroke since it has been more than 24 hours since numbness started.  She does have a facial droop which was not recognized by the patient nor the paramedics.  Problems Addressed: Acute ischemic stroke [facility]): acute illness or injury    Details: This patient has what appears to be an acute neurologic deficit however this started more than 24 hours ago.  I did discuss the case with Dr. Leonie Man, of the neurology service who recommends that the code stroke be canceled as the patient is on Eliquis, hypertensive and has no signs of acute stroke on CT scan with findings that do not support a large vessel occlusion.  He recommends an MRI and inpatient work-up for stroke  Amount and/or Complexity of Data Reviewed Labs: ordered.    Details: The patient has no acute anemia, no leukocytosis, no severe electrolyte abnormalities Radiology: ordered and independent interpretation performed.    Details: The CT scan of the brain shows no signs of acute stroke, I have personally viewed and interpreted these images, I agree with the radiologist interpretation ECG/medicine tests: ordered and independent interpretation performed. Decision-making details documented in ED Course.    Details: EKG performed on February 03, 2021 at 6:02 PM shows sinus rhythm at rate of 60 bpm, leftward axis, left bundle branch block, no ST elevations or depressions, no  arrhythmia Discussion of management or test interpretation with external provider(s): I discussed the case with the radiologist who is read the CT scans and agrees that there is no signs of acute hemorrhage.  Discussed with the neurologist who agrees the patient needs to be admitted for the work-up and discussed with the hospitalist who will admit to the hospital  Risk Prescription drug management. Decision regarding hospitalization. Risk Details: Not a candidate for tPA according to           Final Clinical Impression(s) / ED Diagnoses Final diagnoses:  Acute ischemic stroke ([place])  Primary hypertension     Noemi Chapel, MD 02/03/21 [phone]

## 2021-02-03 NOTE — Telephone Encounter (Signed)
Daughter in law states pt has went to the hospital via Ambulance and would like to know recommendation about pts appt on 1/23 please advise.Marland KitchenMarland Kitchen

## 2021-02-04 ENCOUNTER — Observation Stay (HOSPITAL_COMMUNITY): Payer: Medicare Other

## 2021-02-04 ENCOUNTER — Encounter (HOSPITAL_COMMUNITY): Payer: Self-pay | Admitting: Family Medicine

## 2021-02-04 DIAGNOSIS — K580 Irritable bowel syndrome with diarrhea: Secondary | ICD-10-CM | POA: Diagnosis not present

## 2021-02-04 DIAGNOSIS — I129 Hypertensive chronic kidney disease with stage 1 through stage 4 chronic kidney disease, or unspecified chronic kidney disease: Secondary | ICD-10-CM | POA: Diagnosis present

## 2021-02-04 DIAGNOSIS — K148 Other diseases of tongue: Secondary | ICD-10-CM | POA: Diagnosis present

## 2021-02-04 DIAGNOSIS — I252 Old myocardial infarction: Secondary | ICD-10-CM | POA: Diagnosis not present

## 2021-02-04 DIAGNOSIS — R29703 NIHSS score 3: Secondary | ICD-10-CM | POA: Diagnosis present

## 2021-02-04 DIAGNOSIS — E1122 Type 2 diabetes mellitus with diabetic chronic kidney disease: Secondary | ICD-10-CM | POA: Diagnosis present

## 2021-02-04 DIAGNOSIS — I482 Chronic atrial fibrillation, unspecified: Secondary | ICD-10-CM | POA: Diagnosis present

## 2021-02-04 DIAGNOSIS — I639 Cerebral infarction, unspecified: Secondary | ICD-10-CM

## 2021-02-04 DIAGNOSIS — K219 Gastro-esophageal reflux disease without esophagitis: Secondary | ICD-10-CM | POA: Diagnosis present

## 2021-02-04 DIAGNOSIS — Z7901 Long term (current) use of anticoagulants: Secondary | ICD-10-CM | POA: Diagnosis not present

## 2021-02-04 DIAGNOSIS — E785 Hyperlipidemia, unspecified: Secondary | ICD-10-CM | POA: Diagnosis not present

## 2021-02-04 DIAGNOSIS — R262 Difficulty in walking, not elsewhere classified: Secondary | ICD-10-CM | POA: Diagnosis not present

## 2021-02-04 DIAGNOSIS — E119 Type 2 diabetes mellitus without complications: Secondary | ICD-10-CM | POA: Diagnosis not present

## 2021-02-04 DIAGNOSIS — Z95810 Presence of automatic (implantable) cardiac defibrillator: Secondary | ICD-10-CM | POA: Diagnosis not present

## 2021-02-04 DIAGNOSIS — Z86718 Personal history of other venous thrombosis and embolism: Secondary | ICD-10-CM | POA: Diagnosis not present

## 2021-02-04 DIAGNOSIS — I10 Essential (primary) hypertension: Secondary | ICD-10-CM | POA: Diagnosis not present

## 2021-02-04 DIAGNOSIS — I6781 Acute cerebrovascular insufficiency: Secondary | ICD-10-CM | POA: Diagnosis not present

## 2021-02-04 DIAGNOSIS — G8929 Other chronic pain: Secondary | ICD-10-CM | POA: Diagnosis not present

## 2021-02-04 DIAGNOSIS — D649 Anemia, unspecified: Secondary | ICD-10-CM | POA: Diagnosis not present

## 2021-02-04 DIAGNOSIS — G8194 Hemiplegia, unspecified affecting left nondominant side: Secondary | ICD-10-CM | POA: Diagnosis present

## 2021-02-04 DIAGNOSIS — I2510 Atherosclerotic heart disease of native coronary artery without angina pectoris: Secondary | ICD-10-CM | POA: Diagnosis present

## 2021-02-04 DIAGNOSIS — R531 Weakness: Secondary | ICD-10-CM | POA: Diagnosis present

## 2021-02-04 DIAGNOSIS — R29810 Facial weakness: Secondary | ICD-10-CM | POA: Diagnosis present

## 2021-02-04 DIAGNOSIS — Z833 Family history of diabetes mellitus: Secondary | ICD-10-CM | POA: Diagnosis not present

## 2021-02-04 DIAGNOSIS — N1831 Chronic kidney disease, stage 3a: Secondary | ICD-10-CM | POA: Diagnosis present

## 2021-02-04 DIAGNOSIS — G459 Transient cerebral ischemic attack, unspecified: Secondary | ICD-10-CM

## 2021-02-04 DIAGNOSIS — R1314 Dysphagia, pharyngoesophageal phase: Secondary | ICD-10-CM | POA: Diagnosis not present

## 2021-02-04 DIAGNOSIS — N183 Chronic kidney disease, stage 3 unspecified: Secondary | ICD-10-CM | POA: Diagnosis not present

## 2021-02-04 DIAGNOSIS — G4700 Insomnia, unspecified: Secondary | ICD-10-CM | POA: Diagnosis not present

## 2021-02-04 DIAGNOSIS — R279 Unspecified lack of coordination: Secondary | ICD-10-CM | POA: Diagnosis not present

## 2021-02-04 DIAGNOSIS — I63531 Cerebral infarction due to unspecified occlusion or stenosis of right posterior cerebral artery: Secondary | ICD-10-CM | POA: Diagnosis present

## 2021-02-04 DIAGNOSIS — Z823 Family history of stroke: Secondary | ICD-10-CM | POA: Diagnosis not present

## 2021-02-04 DIAGNOSIS — Z20822 Contact with and (suspected) exposure to covid-19: Secondary | ICD-10-CM | POA: Diagnosis present

## 2021-02-04 DIAGNOSIS — M6281 Muscle weakness (generalized): Secondary | ICD-10-CM | POA: Diagnosis not present

## 2021-02-04 DIAGNOSIS — F32A Depression, unspecified: Secondary | ICD-10-CM | POA: Diagnosis not present

## 2021-02-04 DIAGNOSIS — Z8049 Family history of malignant neoplasm of other genital organs: Secondary | ICD-10-CM | POA: Diagnosis not present

## 2021-02-04 DIAGNOSIS — E039 Hypothyroidism, unspecified: Secondary | ICD-10-CM | POA: Diagnosis present

## 2021-02-04 DIAGNOSIS — E7800 Pure hypercholesterolemia, unspecified: Secondary | ICD-10-CM | POA: Diagnosis present

## 2021-02-04 DIAGNOSIS — M159 Polyosteoarthritis, unspecified: Secondary | ICD-10-CM | POA: Diagnosis not present

## 2021-02-04 DIAGNOSIS — Z66 Do not resuscitate: Secondary | ICD-10-CM | POA: Diagnosis present

## 2021-02-04 DIAGNOSIS — Z8249 Family history of ischemic heart disease and other diseases of the circulatory system: Secondary | ICD-10-CM | POA: Diagnosis not present

## 2021-02-04 LAB — CBC
HCT: 36.6 % (ref 36.0–46.0)
Hemoglobin: 11.9 g/dL — ABNORMAL LOW (ref 12.0–15.0)
MCH: 30.8 pg (ref 26.0–34.0)
MCHC: 32.5 g/dL (ref 30.0–36.0)
MCV: 94.8 fL (ref 80.0–100.0)
Platelets: 212 10*3/uL (ref 150–400)
RBC: 3.86 MIL/uL — ABNORMAL LOW (ref 3.87–5.11)
RDW: 14.9 % (ref 11.5–15.5)
WBC: 5.2 10*3/uL (ref 4.0–10.5)
nRBC: 0 % (ref 0.0–0.2)

## 2021-02-04 LAB — BASIC METABOLIC PANEL
Anion gap: 11 (ref 5–15)
BUN: 16 mg/dL (ref 8–23)
CO2: 24 mmol/L (ref 22–32)
Calcium: 8.9 mg/dL (ref 8.9–10.3)
Chloride: 107 mmol/L (ref 98–111)
Creatinine, Ser: 0.91 mg/dL (ref 0.44–1.00)
GFR, Estimated: >60 mL/min (ref >60)
Glucose, Bld: 94 mg/dL (ref 70–99)
Potassium: 3.5 mmol/L (ref 3.5–5.1)
Sodium: 142 mmol/L (ref 135–145)

## 2021-02-04 LAB — ECHOCARDIOGRAM COMPLETE
AR max vel: 2.4 cm2
AV Area VTI: 2.28 cm2
AV Area mean vel: 2.33 cm2
AV Mean grad: 3 mmHg
AV Peak grad: 5.5 mmHg
Ao pk vel: 1.17 m/s
Area-P 1/2: 2.22 cm2
Calc EF: 56.5 %
Height: 63 in
MV VTI: 2.39 cm2
S' Lateral: 3.4 cm
Single Plane A2C EF: 48.6 %
Single Plane A4C EF: 62.5 %
Weight: 2063.51 oz

## 2021-02-04 LAB — LIPID PANEL
Cholesterol: 150 mg/dL (ref 0–200)
HDL: 72 mg/dL (ref >40)
LDL Cholesterol: 69 mg/dL (ref 0–99)
Total CHOL/HDL Ratio: 2.1 RATIO
Triglycerides: 45 mg/dL (ref <150)
VLDL: 9 mg/dL (ref 0–40)

## 2021-02-04 LAB — HEMOGLOBIN A1C
Hgb A1c MFr Bld: 6.5 % — ABNORMAL HIGH (ref 4.8–5.6)
Mean Plasma Glucose: 140 mg/dL

## 2021-02-04 LAB — GLUCOSE, CAPILLARY: Glucose-Capillary: 88 mg/dL (ref 70–99)

## 2021-02-04 MED ORDER — ISOSORBIDE MONONITRATE ER 60 MG PO TB24
60.0000 mg | ORAL_TABLET | Freq: Every day | ORAL | Status: DC
  Administered 2021-02-04 – 2021-02-08 (×5): 60 mg via ORAL
  Filled 2021-02-04 (×5): qty 1

## 2021-02-04 MED ORDER — METOPROLOL SUCCINATE ER 25 MG PO TB24
12.5000 mg | ORAL_TABLET | Freq: Every day | ORAL | Status: DC
Start: 2021-02-04 — End: 2021-02-04

## 2021-02-04 MED ORDER — ASPIRIN EC 81 MG PO TBEC
81.0000 mg | DELAYED_RELEASE_TABLET | Freq: Every day | ORAL | Status: DC
  Administered 2021-02-04 – 2021-02-08 (×5): 81 mg via ORAL
  Filled 2021-02-04 (×5): qty 1

## 2021-02-04 MED ORDER — MELATONIN 3 MG PO TABS
6.0000 mg | ORAL_TABLET | Freq: Every evening | ORAL | Status: DC | PRN
  Administered 2021-02-04 – 2021-02-07 (×4): 6 mg via ORAL
  Filled 2021-02-04 (×4): qty 2

## 2021-02-04 NOTE — Plan of Care (Signed)
°  Problem: Acute Rehab OT Goals (only OT should resolve) Goal: Pt. Will Perform Grooming Flowsheets (Taken 02/04/2021 1224) Pt Will Perform Grooming:  with supervision  standing Goal: Pt. Will Perform Upper Body Dressing Flowsheets (Taken 02/04/2021 1224) Pt Will Perform Upper Body Dressing:  with modified independence  sitting Goal: Pt. Will Perform Lower Body Dressing Flowsheets (Taken 02/04/2021 1224) Pt Will Perform Lower Body Dressing:  with min assist  with mod assist  sitting/lateral leans  with adaptive equipment Goal: Pt. Will Transfer To Toilet Flowsheets (Taken 02/04/2021 1224) Pt Will Transfer to Toilet:  with supervision  ambulating Goal: Pt/Caregiver Will Perform Home Exercise Program Flowsheets (Taken 02/04/2021 1224) Pt/caregiver will Perform Home Exercise Program:  Increased strength  Left upper extremity  With Supervision  Marieann Zipp OT, MOT

## 2021-02-04 NOTE — Evaluation (Signed)
Physical Therapy Evaluation Patient Details Name: [name] MRN: [id number] DOB: [birth year] Today's Date: 02/04/2021  History of Present Illness  [name]  is a 89 y.o. female, with medical history significant for atrial fibrillation and DVT, on Eliquis, coronary artery disease with AICD, hypertension, hyperlipidemia, hypothyroidism.  -Patient presents to ED secondary to weakness, presents from home, she was at urologist office because of history of kidney stones, was back into the house around 11:30, she was able to walk back into the house, 2 hours after she walked into the house around 1:30, she tried to get out of bed and walk, she could not stand up, and she felt her left leg is weak, and not feel right, the as well reports left-sided tingling and numbness yesterday when she woke up from sleep, she denies any loss of consciousness, vision changes, altered mental status, speech problems, she was noted to have left-sided weakness by EMS.  -ED code stroke was called initially, but after discussing with neurology on-call at [facility] it was canceled given patient hypertensive, on Eliquis, and no significant focal deficits present while in ED, CT head with no acute findings, CTA head and neck with a small vessel disease, MRI could not be obtained due to ICD, [facility] hospitalist consulted to admit.   Clinical Impression  Patient limited for functional mobility as stated below secondary to BLE weakness with weakness L>R, fatigue and poor standing balance. Patient completes bed mobility with slow, labored movements, cueing and assist for weakness to transition to seated EOB. She demonstrates good sitting balance EOB. Patient with decreased LLE strength, sensation, and coordination. She is able to transfer to standing and ambulate with min/mod assist and use of RW. She ambulates with intermittent LLE buckling and impaired coordination. Patient returned to bed at end of session. Patient will benefit  from continued physical therapy in hospital and recommended venue below to increase strength, balance, endurance for safe ADLs and gait.        Recommendations for follow up therapy are one component of a multi-disciplinary discharge planning process, led by the attending physician.  Recommendations may be updated based on patient status, additional functional criteria and insurance authorization.  Follow Up Recommendations Skilled nursing-short term rehab (<3 hours/day)    Assistance Recommended at Discharge Frequent or constant Supervision/Assistance  Patient can return home with the following  A lot of help with walking and/or transfers;A lot of help with bathing/dressing/bathroom;Assistance with cooking/housework;Help with stairs or ramp for entrance    Equipment Recommendations None recommended by PT  Recommendations for Other Services       Functional Status Assessment Patient has had a recent decline in their functional status and demonstrates the ability to make significant improvements in function in a reasonable and predictable amount of time.     Precautions / Restrictions Precautions Precautions: Fall Restrictions Weight Bearing Restrictions: No      Mobility  Bed Mobility Overal bed mobility: Needs Assistance Bed Mobility: Supine to Sit, Sit to Supine     Supine to sit: Min assist Sit to supine: Min guard   General bed mobility comments: assist to pull to seated EOB, labored, unsteady    Transfers Overall transfer level: Needs assistance Equipment used: Rolling walker (2 wheels) Transfers: Sit to/from Stand Sit to Stand: Min assist, Mod assist           General transfer comment: labored, unsteady with RW, assist for weakness/balance    Ambulation/Gait Ambulation/Gait assistance: Min assist, Mod assist Gait  Distance (Feet): 20 Feet Assistive device: Rolling walker (2 wheels) Gait Pattern/deviations: Decreased stride length, Wide base of support,  Knee flexed in stance - left Gait velocity: decreased     General Gait Details: unsteady, impaired LLE coordination, assist for balance with RW  Stairs            Wheelchair Mobility    Modified Rankin (Stroke Patients Only)       Balance Overall balance assessment: Needs assistance Sitting-balance support: No upper extremity supported, Feet supported Sitting balance-Leahy Scale: Good Sitting balance - Comments: fair/good seated EOB   Standing balance support: Bilateral upper extremity supported, Reliant on assistive device for balance Standing balance-Leahy Scale: Poor Standing balance comment: with RW                             Pertinent Vitals/Pain Pain Assessment Pain Assessment: 0-10 Pain Score: 6  Pain Location: stomach ache; LLE numbness Pain Descriptors / Indicators: Aching, Pins and needles, Numbness Pain Intervention(s): Limited activity within patient's tolerance, Monitored during session, Repositioned    Home Living Family/patient expects to be discharged to:: Private residence Living Arrangements: Alone Available Help at Discharge: Family;Personal care attendant;Available PRN/intermittently Type of Home: House Home Access: Stairs to enter Entrance Stairs-Rails: None Entrance Stairs-Number of Steps: 1   Home Layout: One level;Laundry or work area in basement;Able to live on main level with bedroom/bathroom Home Equipment: Tatum (4 wheels);Cane - quad;Cane - single point;BSC/3in1;Tub bench;Wheelchair - manual;Grab bars - tub/shower Additional Comments: family lives across the street; PCA 8 hours 5x/week but aid has been sick; alone at night    Prior Function Prior Level of Function : Needs assist             Mobility Comments: Patient states household ambulation with rollator, aid assists ADLs Comments: aid/family assist     Hand Dominance   Dominant Hand: Right    Extremity/Trunk Assessment   Upper Extremity  Assessment Upper Extremity Assessment: Defer to OT evaluation    Lower Extremity Assessment Lower Extremity Assessment: Generalized weakness;LLE deficits/detail LLE Deficits / Details: LLE decreased and grossly 3+ to 4-/5 MMT LLE Sensation: decreased light touch LLE Coordination: decreased gross motor       Communication   Communication: HOH  Cognition Arousal/Alertness: Awake/alert Behavior During Therapy: WFL for tasks assessed/performed Overall Cognitive Status: Within Functional Limits for tasks assessed                                          General Comments      Exercises     Assessment/Plan    PT Assessment Patient needs continued PT services  PT Problem List Decreased strength;Decreased mobility;Decreased activity tolerance;Decreased balance;Decreased coordination       PT Treatment Interventions DME instruction;Therapeutic exercise;Gait training;Balance training;Stair training;Neuromuscular re-education;Functional mobility training;Therapeutic activities;Patient/family education    PT Goals (Current goals can be found in the Care Plan section)  Acute Rehab PT Goals Patient Stated Goal: return home PT Goal Formulation: With patient Time For Goal Achievement: 02/18/21 Potential to Achieve Goals: Fair    Frequency Min 3X/week     Co-evaluation PT/OT/SLP Co-Evaluation/Treatment: Yes Reason for Co-Treatment: To address functional/ADL transfers PT goals addressed during session: Mobility/safety with mobility;Balance;Proper use of DME;Strengthening/ROM OT goals addressed during session: ADL's and self-care       AM-PAC PT "6 Clicks" Mobility  Outcome Measure Help needed turning from your back to your side while in a flat bed without using bedrails?: None Help needed moving from lying on your back to sitting on the side of a flat bed without using bedrails?: A Little Help needed moving to and from a bed to a chair (including a  wheelchair)?: A Lot Help needed standing up from a chair using your arms (e.g., wheelchair or bedside chair)?: A Lot Help needed to walk in hospital room?: A Lot Help needed climbing 3-5 steps with a railing? : A Lot 6 Click Score: 15    End of Session Equipment Utilized During Treatment: Gait belt Activity Tolerance: Patient tolerated treatment well;Patient limited by fatigue Patient left: in bed;with call bell/phone within reach;with bed alarm set Nurse Communication: Mobility status PT Visit Diagnosis: Unsteadiness on feet (R26.81);Other abnormalities of gait and mobility (R26.89);Muscle weakness (generalized) (M62.81)    Time: 8757-9728 PT Time Calculation (min) (ACUTE ONLY): 33 min   Charges:   PT Evaluation $PT Eval Moderate Complexity: 1 Mod PT Treatments $Therapeutic Activity: 8-22 mins        10:35 AM, 02/04/21 [name] PT, DPT Physical Therapist at [facility]

## 2021-02-04 NOTE — Plan of Care (Signed)
°  Problem: Acute Rehab PT Goals(only PT should resolve) Goal: Pt Will Go Supine/Side To Sit Outcome: Progressing Flowsheets (Taken 02/04/2021 1038) Pt will go Supine/Side to Sit: with supervision Goal: Patient Will Transfer Sit To/From Stand Outcome: Progressing Flowsheets (Taken 02/04/2021 1038) Patient will transfer sit to/from stand:  with supervision  with min guard assist Goal: Pt Will Transfer Bed To Chair/Chair To Bed Outcome: Progressing Flowsheets (Taken 02/04/2021 1038) Pt will Transfer Bed to Chair/Chair to Bed:  with supervision  min guard assist Goal: Pt Will Ambulate Outcome: Progressing Flowsheets (Taken 02/04/2021 1038) Pt will Ambulate:  50 feet  with minimal assist  with least restrictive assistive device Goal: Pt/caregiver will Perform Home Exercise Program Outcome: Progressing Flowsheets (Taken 02/04/2021 1038) Pt/caregiver will Perform Home Exercise Program:  For increased strengthening  For improved balance  Independently  10:38 AM, 02/04/21 [name] PT, DPT Physical Therapist at [facility]

## 2021-02-04 NOTE — Consult Note (Signed)
[facility] Telemedicine Consult   Requesting Provider: Dr. Dwyane Dee  Chief Complaint: left sided weakness and numbness  HPI:  88 year old female with history of A. fib and DVT on Eliquis, CAD status post AICD, hypertension, hyperlipidemia admitted for left-sided numbness and weakness.  Per patient, she started have left-sided numbness, feels like arm and leg falling asleep, as well as mild weakness for the last 2 days and gradually getting worse.  She also complaining of blurry vision left more than right, however she does have macular degeneration underwent injection in the past. CT head no acute abnormality but remote left thalamic infarct new since 2020.  CTA head and neck no LVO, bilateral P2 and P3 multifocal narrowing and right VA moderate stenos is at the origin.  MRI able not to perform given noncompatible AICD.  CT repeat at 24h, no acute finding. 2D echo EF 50 to 55%.  LDL 69, A1c pending.  UDS negative.  Creatinine 1.19->0.91.  BP high on presentation, 200s. Takes Eliquis 2.5 mg twice daily and Crestor 10 at home.    At baseline, patient lives alone, walks with walker.  But she does have a caregiver daily from 9-5 help of her meals.  Her family lives around her and her son is taking care of her medications.  LKW: 1:30 PM yesterday tpa given?: No, on Eliquis IR Thrombectomy? No, low NIHSS, no sign of LVO Modified Rankin Scale: 3   Exam: Vitals:   02/04/21 1250 02/04/21 1411  BP: (!) 178/76 (!) 182/76  Pulse: (!) 58 60  Resp:    Temp: 98.1 F (36.7 C) 97.6 F (36.4 C)  SpO2:  100%     Temp:  [97.6 F (36.4 C)-98.4 F (36.9 C)] 97.6 F (36.4 C) (01/20 1411) Pulse Rate:  [56-76] 60 (01/20 1411) Resp:  [10-20] 20 (01/20 0828) BP: (133-234)/(46-98) 182/76 (01/20 1411) SpO2:  [96 %-100 %] 100 % (01/20 1411) Weight:  [54.4 kg-58.5 kg] 58.5 kg (01/19 2040)  General - Well nourished, well developed, in no apparent distress.  Ophthalmologic - fundi not visualized  due to noncooperation.  Cardiovascular - Regular rhythm and rate, not in afib.  Neuro - awake, alert, eyes open, orientated to age, place, time. No aphasia, but paucity of speech, but following all simple commands. Able to name and repeat. No gaze palsy, tracking bilaterally, visual field full. No facial droop. Hard of hearing. Tongue midline. Bilateral UEs proximal 4/5, no drift. However per RN, the left hand grip weaker than right. Bilaterally LEs proximal 4/5, no drift, however, per RN, the left foot PF/DF weaker than right. Sensation decreased on the left, b/l FTN intact grossly, gait not tested.     Imaging Reviewed:  CT Angio Head W or Wo Contrast  Result Date: 02/03/2021 CLINICAL DATA:  Left-sided weakness, stroke suspected EXAM: CT ANGIOGRAPHY HEAD AND NECK TECHNIQUE: Multidetector CT imaging of the head and neck was performed using the standard protocol during bolus administration of intravenous contrast. Multiplanar CT image reconstructions and MIPs were obtained to evaluate the vascular anatomy. Carotid stenosis measurements (when applicable) are obtained utilizing NASCET criteria, using the distal internal carotid diameter as the denominator. RADIATION DOSE REDUCTION: This exam was performed according to the departmental dose-optimization program which includes automated exposure control, adjustment of the mA and/or kV according to patient size and/or use of iterative reconstruction technique. CONTRAST:  5mL OMNIPAQUE IOHEXOL 350 MG/ML SOLN COMPARISON:  No prior CTA, correlation is made with 02/03/2021 CT head FINDINGS: CT HEAD FINDINGS For  noncontrast findings, please see same day CT head. CTA NECK FINDINGS Aortic arch: Standard branching. Imaged portion shows no evidence of aneurysm or dissection. No significant stenosis of the major arch vessel origins. Aortic atherosclerosis. Right carotid system: No evidence of dissection, stenosis (50% or greater) or occlusion. Left carotid system: No  evidence of dissection, stenosis (50% or greater) or occlusion. Vertebral arteries: Codominant. Calcifications at the origin of the right vertebral artery, which cause moderate stenosis; the right vertebral artery is otherwise patent. No evidence of dissection or occlusion. No other evidence of significant stenosis. Skeleton: No acute osseous abnormality. Other neck: Negative. Upper chest: Mild interlobular septal thickening and ground-glass opacities, possibly mild pulmonary edema. No pleural effusion. Review of the MIP images confirms the above findings CTA HEAD FINDINGS Anterior circulation: Both internal carotid arteries are patent to the termini, without mild calcifications but without significant stenosis A1 segments patent. Normal anterior communicating artery. Anterior cerebral arteries are patent to their distal aspects. No M1 stenosis or occlusion. Normal MCA bifurcations. Distal MCA branches perfused and symmetric. Posterior circulation: Vertebral arteries patent to the vertebrobasilar junction without stenosis. Posterior inferior cerebral arteries patent bilaterally. Basilar patent to its distal aspect. Superior cerebellar arteries patent bilaterally. Bilateral P1 segments originate from the basilar artery. Multifocal narrowing in the bilateral P2 and P3 segments (series 505, images 216 and 215 on the on the right, and images 201, 215 and 225 on the left). Poor opacification of the left P3 segments distally, although evaluation is limited by venous contamination. Bilateral posterior communicating arteries are not visualized. Venous sinuses: As permitted by contrast timing, patent. Anatomic variants: None significant Review of the MIP images confirms the above findings IMPRESSION: 1. Multifocal narrowing in the bilateral P2 and P3 segments, with poor opacification of the distal P3 segments. No other significant intracranial stenosis. 2. Moderate narrowing at the origin of the right vertebral artery, which  is otherwise patent. No other hemodynamically significant stenosis in the neck. These results were called by telephone at the time of interpretation on 02/03/2021 at 6:29 pm to provider [facility] , who verbally acknowledged these results. Electronically Signed   By: Merilyn Baba M.D.   On: 02/03/2021 18:30   CT HEAD WO CONTRAST (5MM)  Result Date: 02/04/2021 CLINICAL DATA:  Stroke follow-up EXAM: CT HEAD WITHOUT CONTRAST TECHNIQUE: Contiguous axial images were obtained from the base of the skull through the vertex without intravenous contrast. RADIATION DOSE REDUCTION: This exam was performed according to the departmental dose-optimization program which includes automated exposure control, adjustment of the mA and/or kV according to patient size and/or use of iterative reconstruction technique. COMPARISON:  CT head 02/03/2021 FINDINGS: Brain: Mild atrophy. Moderate white matter changes with patchy hypodensity throughout the cerebral white matter bilaterally. Ill-defined hypodensity left thalamus unchanged. Negative for acute cortical infarct, hemorrhage, mass Vascular: Negative for hyperdense vessel Skull: Negative Sinuses/Orbits: Mild mucosal edema right maxillary sinus. Remaining sinuses clear. Right cataract extraction Other: None IMPRESSION: Atrophy and chronic microvascular ischemic change. No acute cortical infarct. Left thalamic hypodensity compatible with infarct of indeterminate age Negative for hemorrhage. Electronically Signed   By: Franchot Gallo M.D.   On: 02/04/2021 14:11   CT Angio Neck W and/or Wo Contrast  Result Date: 02/03/2021 CLINICAL DATA:  Left-sided weakness, stroke suspected EXAM: CT ANGIOGRAPHY HEAD AND NECK TECHNIQUE: Multidetector CT imaging of the head and neck was performed using the standard protocol during bolus administration of intravenous contrast. Multiplanar CT image reconstructions and MIPs were obtained to evaluate  the vascular anatomy. Carotid stenosis measurements  (when applicable) are obtained utilizing NASCET criteria, using the distal internal carotid diameter as the denominator. RADIATION DOSE REDUCTION: This exam was performed according to the departmental dose-optimization program which includes automated exposure control, adjustment of the mA and/or kV according to patient size and/or use of iterative reconstruction technique. CONTRAST:  44mL OMNIPAQUE IOHEXOL 350 MG/ML SOLN COMPARISON:  No prior CTA, correlation is made with 02/03/2021 CT head FINDINGS: CT HEAD FINDINGS For noncontrast findings, please see same day CT head. CTA NECK FINDINGS Aortic arch: Standard branching. Imaged portion shows no evidence of aneurysm or dissection. No significant stenosis of the major arch vessel origins. Aortic atherosclerosis. Right carotid system: No evidence of dissection, stenosis (50% or greater) or occlusion. Left carotid system: No evidence of dissection, stenosis (50% or greater) or occlusion. Vertebral arteries: Codominant. Calcifications at the origin of the right vertebral artery, which cause moderate stenosis; the right vertebral artery is otherwise patent. No evidence of dissection or occlusion. No other evidence of significant stenosis. Skeleton: No acute osseous abnormality. Other neck: Negative. Upper chest: Mild interlobular septal thickening and ground-glass opacities, possibly mild pulmonary edema. No pleural effusion. Review of the MIP images confirms the above findings CTA HEAD FINDINGS Anterior circulation: Both internal carotid arteries are patent to the termini, without mild calcifications but without significant stenosis A1 segments patent. Normal anterior communicating artery. Anterior cerebral arteries are patent to their distal aspects. No M1 stenosis or occlusion. Normal MCA bifurcations. Distal MCA branches perfused and symmetric. Posterior circulation: Vertebral arteries patent to the vertebrobasilar junction without stenosis. Posterior inferior  cerebral arteries patent bilaterally. Basilar patent to its distal aspect. Superior cerebellar arteries patent bilaterally. Bilateral P1 segments originate from the basilar artery. Multifocal narrowing in the bilateral P2 and P3 segments (series 505, images 216 and 215 on the on the right, and images 201, 215 and 225 on the left). Poor opacification of the left P3 segments distally, although evaluation is limited by venous contamination. Bilateral posterior communicating arteries are not visualized. Venous sinuses: As permitted by contrast timing, patent. Anatomic variants: None significant Review of the MIP images confirms the above findings IMPRESSION: 1. Multifocal narrowing in the bilateral P2 and P3 segments, with poor opacification of the distal P3 segments. No other significant intracranial stenosis. 2. Moderate narrowing at the origin of the right vertebral artery, which is otherwise patent. No other hemodynamically significant stenosis in the neck. These results were called by telephone at the time of interpretation on 02/03/2021 at 6:29 pm to provider [facility] , who verbally acknowledged these results. Electronically Signed   By: Merilyn Baba M.D.   On: 02/03/2021 18:30   ECHOCARDIOGRAM COMPLETE  Result Date: 02/04/2021    ECHOCARDIOGRAM REPORT   Patient Name:   ABBYGALE LAPID Date of Exam: 02/04/2021 Medical Rec #:  [id number]        Height:       63.0 in Accession #:    [id number]       Weight:       129.0 lb Date of Birth:  [birth year]        BSA:          1.605 m Patient Age:    49 years         BP:           145/65 mmHg Patient Gender: F                HR:  56 bpm. Exam Location:  [facility] Procedure: 2D Echo, Cardiac Doppler and Color Doppler Indications:    TIA  History:        Patient has prior history of Echocardiogram examinations, most                 recent 11/20/2019. CAD, Defibrillator, TIA,                 Arrythmias:Bradycardia and Atrial Fibrillation,                  Signs/Symptoms:Chest Pain; Risk Factors:Hypertension, Diabetes                 and Dyslipidemia.  Sonographer:    Wenda Low Referring Phys: [place]  1. Left ventricular ejection fraction, by estimation, is 50 to 55%. The left ventricle has low normal function. The left ventricle has no regional wall motion abnormalities. There is mild concentric left ventricular hypertrophy. Left ventricular diastolic parameters are consistent with Grade I diastolic dysfunction (impaired relaxation).  2. Right ventricular systolic function is normal. The right ventricular size is normal.  3. Left atrial size was severely dilated.  4. The mitral valve is degenerative. Trivial mitral valve regurgitation. No evidence of mitral stenosis.  5. The aortic valve is tricuspid. Aortic valve regurgitation is mild. No aortic stenosis is present.  6. Aortic dilatation noted. There is mild dilatation of the ascending aorta, measuring 41 mm. Comparison(s): No significant change from prior study. Conclusion(s)/Recommendation(s): No intracardiac source of embolism detected on this transthoracic study. Consider a transesophageal echocardiogram to exclude cardiac source of embolism if clinically indicated. FINDINGS  Left Ventricle: Left ventricular ejection fraction, by estimation, is 50 to 55%. The left ventricle has low normal function. The left ventricle has no regional wall motion abnormalities. The left ventricular internal cavity size was normal in size. There is mild concentric left ventricular hypertrophy. Left ventricular diastolic parameters are consistent with Grade I diastolic dysfunction (impaired relaxation). Right Ventricle: The right ventricular size is normal. No increase in right ventricular wall thickness. Right ventricular systolic function is normal. Left Atrium: Left atrial size was severely dilated. Right Atrium: Right atrial size was normal in size. Pericardium: There is no evidence of  pericardial effusion. Mitral Valve: The mitral valve is degenerative in appearance. Mild to moderate mitral annular calcification. Trivial mitral valve regurgitation. No evidence of mitral valve stenosis. MV peak gradient, 2.1 mmHg. The mean mitral valve gradient is 1.0 mmHg. Tricuspid Valve: The tricuspid valve is grossly normal. Tricuspid valve regurgitation is mild . No evidence of tricuspid stenosis. Aortic Valve: The aortic valve is tricuspid. Aortic valve regurgitation is mild. No aortic stenosis is present. Aortic valve mean gradient measures 3.0 mmHg. Aortic valve peak gradient measures 5.5 mmHg. Aortic valve area, by VTI measures 2.28 cm. Pulmonic Valve: The pulmonic valve was grossly normal. Pulmonic valve regurgitation is trivial. No evidence of pulmonic stenosis. Aorta: Aortic dilatation noted. There is mild dilatation of the ascending aorta, measuring 41 mm. Venous: The inferior vena cava was not well visualized. IAS/Shunts: The atrial septum is grossly normal. Additional Comments: A device lead is visualized in the right atrium and right ventricle.  LEFT VENTRICLE PLAX 2D LVIDd:         4.90 cm     Diastology LVIDs:         3.40 cm     LV e' medial:    4.95 cm/s LV PW:  1.20 cm     LV E/e' medial:  10.6 LV IVS:        1.20 cm     LV e' lateral:   8.85 cm/s LVOT diam:     2.00 cm     LV E/e' lateral: 5.9 LV SV:         63 LV SV Index:   40 LVOT Area:     3.14 cm  LV Volumes (MOD) LV vol d, MOD A2C: 46.3 ml LV vol d, MOD A4C: 39.5 ml LV vol s, MOD A2C: 23.8 ml LV vol s, MOD A4C: 14.8 ml LV SV MOD A2C:     22.5 ml LV SV MOD A4C:     39.5 ml LV SV MOD BP:      24.6 ml RIGHT VENTRICLE RV Basal diam:  3.35 cm RV Mid diam:    2.50 cm RV S prime:     9.68 cm/s TAPSE (M-mode): 2.4 cm LEFT ATRIUM             Index        RIGHT ATRIUM           Index LA diam:        4.10 cm 2.55 cm/m   RA Area:     16.70 cm LA Vol (A2C):   97.5 ml 60.76 ml/m  RA Volume:   44.80 ml  27.92 ml/m LA Vol (A4C):   80.6 ml  50.23 ml/m LA Biplane Vol: 91.7 ml 57.14 ml/m  AORTIC VALVE                    PULMONIC VALVE AV Area (Vmax):    2.40 cm     PV Vmax:       0.77 m/s AV Area (Vmean):   2.33 cm     PV Peak grad:  2.4 mmHg AV Area (VTI):     2.28 cm AV Vmax:           117.00 cm/s AV Vmean:          76.200 cm/s AV VTI:            0.278 m AV Peak Grad:      5.5 mmHg AV Mean Grad:      3.0 mmHg LVOT Vmax:         89.40 cm/s LVOT Vmean:        56.500 cm/s LVOT VTI:          0.202 m LVOT/AV VTI ratio: 0.73  AORTA Ao Root diam: 3.30 cm Ao Asc diam:  4.10 cm MITRAL VALVE               TRICUSPID VALVE MV Area (PHT): 2.22 cm    TR Peak grad:   27.0 mmHg MV Area VTI:   2.39 cm    TR Vmax:        260.00 cm/s MV Peak grad:  2.1 mmHg MV Mean grad:  1.0 mmHg    SHUNTS MV Vmax:       0.73 m/s    Systemic VTI:  0.20 m MV Vmean:      46.9 cm/s   Systemic Diam: 2.00 cm MV Decel Time: 341 msec MV E velocity: 52.30 cm/s MV A velocity: 74.10 cm/s MV E/A ratio:  0.71 Eleonore Chiquito MD Electronically signed by Eleonore Chiquito MD Signature Date/Time: 02/04/2021/12:12:36 PM    Final    CT HEAD CODE STROKE WO CONTRAST  Result Date: 02/03/2021  CLINICAL DATA:  Code stroke. EXAM: CT HEAD WITHOUT CONTRAST TECHNIQUE: Contiguous axial images were obtained from the base of the skull through the vertex without intravenous contrast. RADIATION DOSE REDUCTION: This exam was performed according to the departmental dose-optimization program which includes automated exposure control, adjustment of the mA and/or kV according to patient size and/or use of iterative reconstruction technique. COMPARISON:  CT head 08/28/2018 FINDINGS: Brain: There is no evidence of acute intracranial hemorrhage, extra-axial fluid collection, or acute infarct. There is a background of mild global parenchymal volume loss with prominence of the ventricular system and extra-axial CSF spaces. There is hypodensity in the left thalamus which is increased in conspicuity compared to the study from  2020 but is favored to reflect a remote lacunar infarct. Additional hypodensity in the subcortical and periventricular white matter likely reflects sequela of moderate chronic white matter microangiopathy. There is no mass lesion.  There is no midline shift. Vascular: There is calcification of the bilateral cavernous ICAs. Skull: Normal. Negative for fracture or focal lesion. Sinuses/Orbits: The imaged paranasal sinuses are clear. A right lens implant is noted. The globes and orbits are otherwise unremarkable. Other: None. ASPECTS [facility] Stroke Program Early CT Score) - Ganglionic level infarction (caudate, lentiform nuclei, internal capsule, insula, M1-M3 cortex): 7 - Supraganglionic infarction (M4-M6 cortex): 3 Total score (0-10 with 10 being normal): 10 IMPRESSION: 1. No evidence of acute intracranial hemorrhage or infarct. 2. ASPECTS is 10 3. Small remote lacunar infarct in the left thalamus appears new since 2020. 4. Background of mild global parenchymal volume loss and moderate chronic white matter microangiopathy. These results were called by telephone at the time of interpretation on 02/03/2021 at 4:56 pm to provider [facility] , who verbally acknowledged these results. Electronically Signed   By: Valetta Mole M.D.   On: 02/03/2021 16:59     Labs reviewed in epic and pertinent values follow: LDL 69, A1c pending.  UDS negative.  Assessment:  88 year old female with history of A. fib and DVT on Eliquis, CAD status post AICD, hypertension, hyperlipidemia admitted for left-sided numbness and weakness for 2 days. CT head no acute abnormality but remote left thalamic infarct new since 2020.  CTA head and neck no LVO, bilateral P2 and P3 multifocal narrowing.  MRI able not to perform given noncompatible AICD.  CT repeat at 24h, no acute finding. 2D echo EF 50 to 55%.  LDL 69, A1c pending.  UDS negative. BP high on presentation, 200s. Takes Eliquis 2.5 mg twice daily and Crestor 10 at home.    Exam  showed mild distal left hand and foot weakness as well as decreased sensation on the left. CT repeat no stroke. However, given pt stroke risk factor and exam, still concerning for acute lacunar infarct likely right thalamus due to right PCA stenosis. Recommend to add ASA 81 on top of eliquis 2.5mg  bid. Continue statin. Gradually normalize BP in 2-3 days. PT/OT recommend SNF.   Recommendations:  Recommend to add ASA 81 on top of eliquis 2.5mg  bid.  Continue statin.  Gradually normalize BP in 2-3 days.  PT/OT recommend SNF.  Continue follow up with cardiology for afib management.  Follow up with Dr. Merlene Laughter in 4 weeks as outpt.    Consult Participants: pt, RN and me Location of the provider: home Location of the patient: [facility]  This consult was provided via telemedicine with 2-way video and audio communication. The patient/family was informed that care would be provided in this way and agreed to  receive care in this manner.   This patient is receiving care for possible acute neurological changes. There was 60 minutes of care by this provider at the time of service, including time for direct evaluation via telemedicine, review of medical records, imaging studies and discussion of findings with providers, the patient and/or family.  Renee Hawking, MD PhD Stroke Neurology 02/04/2021 2:17 PM

## 2021-02-04 NOTE — Progress Notes (Signed)
*  PRELIMINARY RESULTS* Echocardiogram 2D Echocardiogram has been performed.  [name] 02/04/2021, 11:16 AM

## 2021-02-04 NOTE — Progress Notes (Signed)
PROGRESS NOTE    [name]  JKD:[id number] DOB: 1931/08/05 DOA: 02/03/2021  PCP: [name], DO   Brief Narrative:  This 88 years old female with PMH significant for atrial fibrillation and DVT on Eliquis, coronary artery disease with AICD, hypertension, hyperlipidemia, hypothyroidism presented in the ED with complaints of left-sided weakness.  Patient reports she was at urologist office because of kidney stones in the morning and she came back home,  She was able to walk at home.  She went to the bed and 2 hours after she woke up and was not able to get out of bed and walk, she could not stand and felt her left leg is weak.  She also reported left-sided tingling and numbness after she woke up from sleep.  She denies any loss of consciousness, visual changes, altered mentation, speech problems.  She was also noted to have left-sided weakness by EMS.  Patient was assessed by tele neurology, deemed not a candidate for tPA given being on Eliquis,, hypertensive, with no significant focal deficits.  CT head no acute findings, CTA head and neck showed small vessel disease.  MRI could not be obtained due to AICD.  Formal neurology consult is obtained.  Assessment & Plan:   Principal Problem:   TIA (transient ischemic attack) Active Problems:   CKD (chronic kidney disease), stage III (HCC)   ICD (implantable cardioverter-defibrillator) battery depletion   Diabetes mellitus type 2, diet-controlled ([place])   Essential hypertension   GERD (gastroesophageal reflux disease)   Atrial fibrillation, chronic (HCC)   CAD (coronary artery disease)   Hypothyroidism  Acute CVA versus TIA: Patient presented with left-sided weakness, tingling and numbness. She was also noted to have left-sided facial droop on physical exam and left tongue deviation. CT head without any acute findings.  CTA head and neck showed small vessel disease. Tele- neurologist was consulted by ED physician. Patient does not  deemed candidate for tPA given she is on Eliquis, hypertensive and has no focal signs of acute CVA on CT scan, CTA does not support large vessel occlusion. Patient is admitted for CVA work-up. Obtain 2D echocardiogram, lipid panel, A1c, carotid duplex. MRI could not be obtained due to patient has noncompatible AICD. Continue with Eliquis, Crestor. PT and OT recommended  skilled nursing facility for rehab. Formal neurology consulted awaiting recommendation. Allow permissive hypertension.  CKD stage IIIa: Creatinine at baseline.   Continue to monitor  Chronic atrial fibrillation: Continue Eliquis, Continue with amiodarone for heart rate control.   Resume Toprol-XL once permissive hypertension over.  History of DVT: Continue Eliquis  CAD: She denies any chest pain or shortness of breath. Continue with statin, resume metoprolol once stable.  GERD: Continue pantoprazole  Hyperlipidemia: Continue Crestor   DVT prophylaxis: Eliquis Code Status: DNR Family Communication: No family at bedside Disposition Plan:    Status is: Observation  The patient remains OBS appropriate and will d/c before 2 midnights.  Admitted for TIA/CVA work-up.  MRI could not be obtained due to AICD.  Formal neurology consulted.  PT recommended SNF.    Consultants:  Neurology  Procedures: CT head, CT head and neck Antimicrobials: None  Subjective: Patient was seen and examined at bedside.  Overnight events noted.   Patient still reports having left-sided weakness. She has participated in physical therapy, recommended SNF.  Objective: Vitals:   02/04/21 0208 02/04/21 0434 02/04/21 0633 02/04/21 0828  BP: (!) 170/61 (!) 151/67 (!) 145/65 (!) 168/72  Pulse: 61 (!) 58 Marland Kitchen)  56 62  Resp: 18 20 18 20   Temp: 98.2 F (36.8 C) 98.2 F (36.8 C) 98.1 F (36.7 C) 97.6 F (36.4 C)  TempSrc: Oral Oral Oral Oral  SpO2: 96% 98% 97% 100%  Weight:      Height:        Intake/Output Summary (Last 24  hours) at 02/04/2021 1107 Last data filed at 02/04/2021 0500 Gross per 24 hour  Intake 391 ml  Output 500 ml  Net -109 ml   Filed Weights   02/03/21 1715 02/03/21 2040  Weight: 54.4 kg 58.5 kg    Examination:  General exam: Appears comfortable, not in any acute distress. Respiratory system: Clear to auscultation bilaterally, respiratory effort normal, RR 15. Cardiovascular system: S1-S2 heard, regular rate and rhythm, no murmur. Gastrointestinal system: Abdomen is soft, nontender, nondistended, BS+ Central nervous system: Alert and oriented X 3. No focal neurological deficits. Extremities: No edema no cyanosis, no clubbing. Skin: No rashes, lesions or ulcers Psychiatry: Judgement and insight appear normal. Mood & affect appropriate.     Data Reviewed: I have personally reviewed following labs and imaging studies  CBC: Recent Labs  Lab 02/03/21 1645 02/04/21 0431  WBC 6.4 5.2  NEUTROABS 3.5  --   HGB 12.2   13.3 11.9*  HCT 36.1   39.0 36.6  MCV 96.0 94.8  PLT 219 540   Basic Metabolic Panel: Recent Labs  Lab 02/03/21 1645 02/04/21 0431  NA 137   138 142  K 3.6   4.4 3.5  CL 105   106 107  CO2 24 24  GLUCOSE 112*   110* 94  BUN 21   28* 16  CREATININE 1.19*   1.20* 0.91  CALCIUM 9.0 8.9   GFR: Estimated Creatinine Clearance: 36 mL/min (by C-G formula based on SCr of 0.91 mg/dL). Liver Function Tests: Recent Labs  Lab 02/03/21 1645  AST 27  ALT 16  ALKPHOS 67  BILITOT 1.1  PROT 6.9  ALBUMIN 3.7   No results for input(s): LIPASE, AMYLASE in the last 168 hours. No results for input(s): AMMONIA in the last 168 hours. Coagulation Profile: Recent Labs  Lab 02/03/21 1724  INR 1.1   Cardiac Enzymes: No results for input(s): CKTOTAL, CKMB, CKMBINDEX, TROPONINI in the last 168 hours. BNP (last 3 results) No results for input(s): PROBNP in the last 8760 hours. HbA1C: No results for input(s): HGBA1C in the last 72 hours. CBG: No results for input(s):  GLUCAP in the last 168 hours. Lipid Profile: Recent Labs    02/04/21 0431  CHOL 150  HDL 72  LDLCALC 69  TRIG 45  CHOLHDL 2.1   Thyroid Function Tests: No results for input(s): TSH, T4TOTAL, FREET4, T3FREE, THYROIDAB in the last 72 hours. Anemia Panel: No results for input(s): VITAMINB12, FOLATE, FERRITIN, TIBC, IRON, RETICCTPCT in the last 72 hours. Sepsis Labs: No results for input(s): PROCALCITON, LATICACIDVEN in the last 168 hours.  Recent Results (from the past 240 hour(s))  Resp Panel by RT-PCR (Flu A&B, Covid) Nasopharyngeal Swab     Status: None   Collection Time: 02/03/21  5:26 PM   Specimen: Nasopharyngeal Swab; Nasopharyngeal(NP) swabs in vial transport medium  Result Value Ref Range Status   SARS Coronavirus 2 by RT PCR NEGATIVE NEGATIVE Final    Comment: (NOTE) SARS-CoV-2 target nucleic acids are NOT DETECTED.  The SARS-CoV-2 RNA is generally detectable in upper respiratory specimens during the acute phase of infection. The lowest concentration of SARS-CoV-2 viral copies this  assay can detect is 138 copies/mL. A negative result does not preclude SARS-Cov-2 infection and should not be used as the sole basis for treatment or other patient management decisions. A negative result may occur with  improper specimen collection/handling, submission of specimen other than nasopharyngeal swab, presence of viral mutation(s) within the areas targeted by this assay, and inadequate number of viral copies(<138 copies/mL). A negative result must be combined with clinical observations, patient history, and epidemiological information. The expected result is Negative.  Fact Sheet for Patients:  EntrepreneurPulse.com.au  Fact Sheet for Healthcare Providers:  IncredibleEmployment.be  This test is no t yet approved or cleared by the [place] FDA and  has been authorized for detection and/or diagnosis of SARS-CoV-2 by FDA under an  Emergency Use Authorization (EUA). This EUA will remain  in effect (meaning this test can be used) for the duration of the COVID-19 declaration under Section 564(b)(1) of the Act, 21 U.S.C.section 360bbb-3(b)(1), unless the authorization is terminated  or revoked sooner.       Influenza A by PCR NEGATIVE NEGATIVE Final   Influenza B by PCR NEGATIVE NEGATIVE Final    Comment: (NOTE) The Xpert Xpress SARS-CoV-2/FLU/RSV plus assay is intended as an aid in the diagnosis of influenza from Nasopharyngeal swab specimens and should not be used as a sole basis for treatment. Nasal washings and aspirates are unacceptable for Xpert Xpress SARS-CoV-2/FLU/RSV testing.  Fact Sheet for Patients: EntrepreneurPulse.com.au  Fact Sheet for Healthcare Providers: IncredibleEmployment.be  This test is not yet approved or cleared by the [place] FDA and has been authorized for detection and/or diagnosis of SARS-CoV-2 by FDA under an Emergency Use Authorization (EUA). This EUA will remain in effect (meaning this test can be used) for the duration of the COVID-19 declaration under Section 564(b)(1) of the Act, 21 U.S.C. section 360bbb-3(b)(1), unless the authorization is terminated or revoked.  Performed at [facility], [address] [place], [place] [address]     Radiology Studies: CT Angio Head W or Wo Contrast  Result Date: 02/03/2021 CLINICAL DATA:  Left-sided weakness, stroke suspected EXAM: CT ANGIOGRAPHY HEAD AND NECK TECHNIQUE: Multidetector CT imaging of the head and neck was performed using the standard protocol during bolus administration of intravenous contrast. Multiplanar CT image reconstructions and MIPs were obtained to evaluate the vascular anatomy. Carotid stenosis measurements (when applicable) are obtained utilizing NASCET criteria, using the distal internal carotid diameter as the denominator. RADIATION DOSE REDUCTION: This exam was performed  according to the departmental dose-optimization program which includes automated exposure control, adjustment of the mA and/or kV according to patient size and/or use of iterative reconstruction technique. CONTRAST:  77mL OMNIPAQUE IOHEXOL 350 MG/ML SOLN COMPARISON:  No prior CTA, correlation is made with 02/03/2021 CT head FINDINGS: CT HEAD FINDINGS For noncontrast findings, please see same day CT head. CTA NECK FINDINGS Aortic arch: Standard branching. Imaged portion shows no evidence of aneurysm or dissection. No significant stenosis of the major arch vessel origins. Aortic atherosclerosis. Right carotid system: No evidence of dissection, stenosis (50% or greater) or occlusion. Left carotid system: No evidence of dissection, stenosis (50% or greater) or occlusion. Vertebral arteries: Codominant. Calcifications at the origin of the right vertebral artery, which cause moderate stenosis; the right vertebral artery is otherwise patent. No evidence of dissection or occlusion. No other evidence of significant stenosis. Skeleton: No acute osseous abnormality. Other neck: Negative. Upper chest: Mild interlobular septal thickening and ground-glass opacities, possibly mild pulmonary edema. No pleural effusion. Review of the  MIP images confirms the above findings CTA HEAD FINDINGS Anterior circulation: Both internal carotid arteries are patent to the termini, without mild calcifications but without significant stenosis A1 segments patent. Normal anterior communicating artery. Anterior cerebral arteries are patent to their distal aspects. No M1 stenosis or occlusion. Normal MCA bifurcations. Distal MCA branches perfused and symmetric. Posterior circulation: Vertebral arteries patent to the vertebrobasilar junction without stenosis. Posterior inferior cerebral arteries patent bilaterally. Basilar patent to its distal aspect. Superior cerebellar arteries patent bilaterally. Bilateral P1 segments originate from the basilar  artery. Multifocal narrowing in the bilateral P2 and P3 segments (series 505, images 216 and 215 on the on the right, and images 201, 215 and 225 on the left). Poor opacification of the left P3 segments distally, although evaluation is limited by venous contamination. Bilateral posterior communicating arteries are not visualized. Venous sinuses: As permitted by contrast timing, patent. Anatomic variants: None significant Review of the MIP images confirms the above findings IMPRESSION: 1. Multifocal narrowing in the bilateral P2 and P3 segments, with poor opacification of the distal P3 segments. No other significant intracranial stenosis. 2. Moderate narrowing at the origin of the right vertebral artery, which is otherwise patent. No other hemodynamically significant stenosis in the neck. These results were called by telephone at the time of interpretation on 02/03/2021 at 6:29 pm to provider [facility] , who verbally acknowledged these results. Electronically Signed   By: Merilyn Baba M.D.   On: 02/03/2021 18:30   CT Angio Neck W and/or Wo Contrast  Result Date: 02/03/2021 CLINICAL DATA:  Left-sided weakness, stroke suspected EXAM: CT ANGIOGRAPHY HEAD AND NECK TECHNIQUE: Multidetector CT imaging of the head and neck was performed using the standard protocol during bolus administration of intravenous contrast. Multiplanar CT image reconstructions and MIPs were obtained to evaluate the vascular anatomy. Carotid stenosis measurements (when applicable) are obtained utilizing NASCET criteria, using the distal internal carotid diameter as the denominator. RADIATION DOSE REDUCTION: This exam was performed according to the departmental dose-optimization program which includes automated exposure control, adjustment of the mA and/or kV according to patient size and/or use of iterative reconstruction technique. CONTRAST:  62mL OMNIPAQUE IOHEXOL 350 MG/ML SOLN COMPARISON:  No prior CTA, correlation is made with 02/03/2021  CT head FINDINGS: CT HEAD FINDINGS For noncontrast findings, please see same day CT head. CTA NECK FINDINGS Aortic arch: Standard branching. Imaged portion shows no evidence of aneurysm or dissection. No significant stenosis of the major arch vessel origins. Aortic atherosclerosis. Right carotid system: No evidence of dissection, stenosis (50% or greater) or occlusion. Left carotid system: No evidence of dissection, stenosis (50% or greater) or occlusion. Vertebral arteries: Codominant. Calcifications at the origin of the right vertebral artery, which cause moderate stenosis; the right vertebral artery is otherwise patent. No evidence of dissection or occlusion. No other evidence of significant stenosis. Skeleton: No acute osseous abnormality. Other neck: Negative. Upper chest: Mild interlobular septal thickening and ground-glass opacities, possibly mild pulmonary edema. No pleural effusion. Review of the MIP images confirms the above findings CTA HEAD FINDINGS Anterior circulation: Both internal carotid arteries are patent to the termini, without mild calcifications but without significant stenosis A1 segments patent. Normal anterior communicating artery. Anterior cerebral arteries are patent to their distal aspects. No M1 stenosis or occlusion. Normal MCA bifurcations. Distal MCA branches perfused and symmetric. Posterior circulation: Vertebral arteries patent to the vertebrobasilar junction without stenosis. Posterior inferior cerebral arteries patent bilaterally. Basilar patent to its distal aspect. Superior cerebellar arteries patent bilaterally.  Bilateral P1 segments originate from the basilar artery. Multifocal narrowing in the bilateral P2 and P3 segments (series 505, images 216 and 215 on the on the right, and images 201, 215 and 225 on the left). Poor opacification of the left P3 segments distally, although evaluation is limited by venous contamination. Bilateral posterior communicating arteries are not  visualized. Venous sinuses: As permitted by contrast timing, patent. Anatomic variants: None significant Review of the MIP images confirms the above findings IMPRESSION: 1. Multifocal narrowing in the bilateral P2 and P3 segments, with poor opacification of the distal P3 segments. No other significant intracranial stenosis. 2. Moderate narrowing at the origin of the right vertebral artery, which is otherwise patent. No other hemodynamically significant stenosis in the neck. These results were called by telephone at the time of interpretation on 02/03/2021 at 6:29 pm to provider [facility] , who verbally acknowledged these results. Electronically Signed   By: Merilyn Baba M.D.   On: 02/03/2021 18:30   CT HEAD CODE STROKE WO CONTRAST  Result Date: 02/03/2021 CLINICAL DATA:  Code stroke. EXAM: CT HEAD WITHOUT CONTRAST TECHNIQUE: Contiguous axial images were obtained from the base of the skull through the vertex without intravenous contrast. RADIATION DOSE REDUCTION: This exam was performed according to the departmental dose-optimization program which includes automated exposure control, adjustment of the mA and/or kV according to patient size and/or use of iterative reconstruction technique. COMPARISON:  CT head 08/28/2018 FINDINGS: Brain: There is no evidence of acute intracranial hemorrhage, extra-axial fluid collection, or acute infarct. There is a background of mild global parenchymal volume loss with prominence of the ventricular system and extra-axial CSF spaces. There is hypodensity in the left thalamus which is increased in conspicuity compared to the study from 2020 but is favored to reflect a remote lacunar infarct. Additional hypodensity in the subcortical and periventricular white matter likely reflects sequela of moderate chronic white matter microangiopathy. There is no mass lesion.  There is no midline shift. Vascular: There is calcification of the bilateral cavernous ICAs. Skull: Normal. Negative  for fracture or focal lesion. Sinuses/Orbits: The imaged paranasal sinuses are clear. A right lens implant is noted. The globes and orbits are otherwise unremarkable. Other: None. ASPECTS [facility] Stroke Program Early CT Score) - Ganglionic level infarction (caudate, lentiform nuclei, internal capsule, insula, M1-M3 cortex): 7 - Supraganglionic infarction (M4-M6 cortex): 3 Total score (0-10 with 10 being normal): 10 IMPRESSION: 1. No evidence of acute intracranial hemorrhage or infarct. 2. ASPECTS is 10 3. Small remote lacunar infarct in the left thalamus appears new since 2020. 4. Background of mild global parenchymal volume loss and moderate chronic white matter microangiopathy. These results were called by telephone at the time of interpretation on 02/03/2021 at 4:56 pm to provider [facility] , who verbally acknowledged these results. Electronically Signed   By: Valetta Mole M.D.   On: 02/03/2021 16:59    Scheduled Meds:   stroke: mapping our early stages of recovery book   Does not apply Once   amiodarone  200 mg Oral Daily   apixaban  2.5 mg Oral BID   busPIRone  5 mg Oral BID   cholecalciferol  2,000 Units Oral q morning   feeding supplement  237 mL Oral BID BM   levothyroxine  100 mcg Oral Q0600   pantoprazole  40 mg Oral Daily   rosuvastatin  10 mg Oral Daily   Continuous Infusions:  sodium chloride 75 mL/hr at 02/04/21 0515     LOS: 0  days    Time spent: 50 mins    Shawna Clamp, MD [facility]   If 7PM-7AM, please contact night-coverage

## 2021-02-04 NOTE — TOC Initial Note (Signed)
Transition of Care [facility]) - Initial/Assessment Note    Patient Details  Name: [name] MRN: [id number] Date of Birth: [birth year]  Transition of Care [facility]) CM/SW Contact:    Renee Flood, LCSW Phone Number: 02/04/2021, 12:41 PM  Clinical Narrative:                  Pt admitted from home. PT/OT recommending SNF rehab at dc. Spoke with pt to review dc planning. Pt states she lives alone but has an aide from 9-5 M-F. Pt has four children who are supportive and involved and live nearby. Pt has a wheelchair, walker, BSC, shower seat for DME.   Discussed therapy recommendation for SNF. Pt states she went to SNF once before but she prefers to go home with [facility]. Pt then asked if she should have someone stay with her overnight. TOC explained that this is a decision for her to make with her family but that especially if she returns home without SNF rehab, it might be a good idea at least for a few nights to see how she is managing. Pt agreeable for TOC to reach out to pt's children.  Unable to reach son, [name] or [name]. Spoke with daughter, [name], who states that they can discuss with pt and make a decision depending on how she is doing closer to dc.   At this time, will anticipate dc home with [facility] when stable. MD anticipating earliest dc would be Sunday. If pt/family change their minds and want SNF, TOC can arrange a new plan.  Expected Discharge Plan: [place] Barriers to Discharge: Continued Medical Work up   Patient Goals and CMS Choice Patient states their goals for this hospitalization and ongoing recovery are:: go home CMS Medicare.gov Compare Post Acute Care list provided to:: Patient    Expected Discharge Plan and Services Expected Discharge Plan: [place] In-house Referral: Clinical Social Work     Living arrangements for the past 2 months: [place]                                      Prior Living  Arrangements/Services Living arrangements for the past 2 months: Single Family Home Lives with:: Self Patient language and need for interpreter reviewed:: Yes Do you feel safe going back to the place where you live?: Yes      Need for Family Participation in Patient Care: Yes (Comment) Care giver support system in place?: Yes (comment) Current home services: [facility] Criminal Activity/Legal Involvement Pertinent to Current Situation/Hospitalization: No - Comment as needed  Activities of Daily Living Home Assistive Devices/Equipment: Environmental consultant (specify type) ADL Screening (condition at time of admission) Patient's cognitive ability adequate to safely complete daily activities?: Yes Is the patient deaf or have difficulty hearing?: Yes Does the patient have difficulty seeing, even when wearing glasses/contacts?: No Does the patient have difficulty concentrating, remembering, or making decisions?: No Patient able to express need for assistance with ADLs?: Yes Does the patient have difficulty dressing or bathing?: Yes Independently performs ADLs?: No Communication: Independent Dressing (OT): Needs assistance Is this a change from baseline?: Pre-admission baseline Grooming: Needs assistance Is this a change from baseline?: Pre-admission baseline Feeding: Independent Bathing: Needs assistance Is this a change from baseline?: Pre-admission baseline Toileting: Independent In/Out Bed: Independent Walks in Home: Independent with device (comment) Does the patient have difficulty walking or climbing  stairs?: No Weakness of Legs: Left Weakness of Arms/Hands: Left  Permission Sought/Granted                  Emotional Assessment   Attitude/Demeanor/Rapport: Engaged Affect (typically observed): Pleasant Orientation: : Oriented to Self, Oriented to Place, Oriented to  Time, Oriented to Situation Alcohol / Substance Use: Not Applicable Psych Involvement: No (comment)  Admission diagnosis:   TIA (transient ischemic attack) [G45.9] Primary hypertension [I10] Acute ischemic stroke [facility]) [I63.9] 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[place]) 05/12/2020   Intractable abdominal pain 03/28/2020   Lactic acidosis 03/28/2020   Elevated troponin I level 03/28/2020   Malnutrition of moderate degree 12/26/2019   Prolonged QT interval 12/23/2019   Acute lower UTI 12/23/2019   Constipation 11/27/2019   Diarrhea 11/27/2019   Lesion of pancreas 11/26/2019   Fibromuscular dysplasia of right renal artery ([place]) 11/26/2019   Left renal artery stenosis (HCC) 11/26/2019   RUQ pain    Abnormal CT of the abdomen    Vertigo 11/15/2019   Advanced care planning/counseling discussion 11/15/2019   CAD (coronary artery disease)    Hypothyroidism    Hyponatremia    Volume depletion    TSH elevation 08/01/2019   Hospital discharge follow-up 08/01/2019   Exudative age-related macular degeneration of right eye with inactive choroidal neovascularization ([place]) 04/30/2019   Retinal hemorrhage of right eye 04/30/2019   Advanced nonexudative age-related macular degeneration of left eye with subfoveal involvement 04/30/2019   Exudative age-related macular degeneration of left eye with inactive choroidal neovascularization ([place]) 04/30/2019   Advanced nonexudative age-related macular degeneration of right eye with subfoveal involvement 04/30/2019   Posterior vitreous detachment of both eyes 04/30/2019   Vitreous degeneration, bilateral 04/30/2019   Secondary hypercoagulable state ([place]) 03/11/2019   Persistent atrial fibrillation (HCC)    Proctitis 01/09/2019   Atrial fibrillation, chronic (HCC) 01/09/2019   Hyperlipidemia 01/09/2019   Nausea 01/09/2019   Hyperglycemia 01/09/2019   COVID-19 virus infection  12/28/2018   Chronic diarrhea 09/18/2018   Abdominal pain 09/18/2018   Osteoarthritis of right knee 03/29/2018   GERD (gastroesophageal reflux disease)    Dehydration 02/06/2018   Hypokalemia 02/06/2018   Diarrhea of presumed infectious origin 02/06/2018   Acute kidney injury superimposed on CKD ([place]) 02/05/2018   Essential hypertension 12/21/2017   Degenerative scoliosis 02/19/2017   Burst fracture of lumbar vertebra ([place]) 02/19/2017   History of drug-induced prolonged QT interval with torsade de pointes 11/01/2016   Iron deficiency anemia 10/30/2016   Long term current use of anticoagulant 02/17/2016   Near syncope 05/22/2015   Diabetes mellitus type 2, diet-controlled ([place]) 09/14/2014   ICD (implantable cardioverter-defibrillator) battery depletion 01/20/2014   Abnormal nuclear stress test, 12/30/13 12/31/2013   Coronary artery disease involving native coronary artery of native heart without angina pectoris 12/31/2013   Paroxysmal atrial fibrillation, chads2 Vasc2 score of 5, on eliquis 10/16/2013   Catecholaminergic polymorphic ventricular tachycardia 10/16/2013   Ureteral stone with hydronephrosis 07/37/1062   Metabolic syndrome 69/48/5462   Orthostatic hypotension 07/27/2011   Chest pain 07/25/2011   Weakness 07/25/2011    Class: Acute   Chronic sinus bradycardia 07/25/2011   ICD (implantable cardioverter-defibrillator) in place 07/25/2011   CKD (chronic kidney disease), stage III ([place]) 07/25/2011    Class: Chronic  History of DVT in the past, on eliquis now 07/25/2011    Class: History of   Nephrolithiasis, just saw Dr [name] 07/25/2011    Class: History of   DYSPHAGIA 03/24/2010   Chronic ischemic heart disease 12/03/2007   Irritable bowel syndrome with diarrhea 12/03/2007   EPIGASTRIC PAIN 12/03/2007   Hyperlipidemia associated with type 2 diabetes mellitus ([place]) 12/02/2007    Class: History of   EXTERNAL HEMORRHOIDS 12/02/2007    Class: History of    ESOPHAGITIS 12/02/2007   GASTRITIS 12/02/2007   DIVERTICULOSIS, COLON 12/02/2007   ARTHRITIS 12/02/2007    Class: Chronic   PCP:  [name], DO Pharmacy:   [place], [address] [place] [address] Phone: [phone] Fax: [phone]     Social Determinants of Health (SDOH) Interventions    Readmission Risk Interventions Readmission Risk Prevention Plan 06/23/2020 03/28/2020 11/17/2019  Transportation Screening - Complete Complete  Medication Review Press photographer) Complete Complete Complete  PCP or Specialist appointment within 3-5 days of discharge Complete - Complete  HRI or [place] Complete Complete Complete  SW Recovery Care/Counseling Consult Complete Complete Complete  Palliative Care Screening Not Applicable Not Applicable Not [place] Patient Refused Not Applicable Not Applicable  Some recent data might be hidden

## 2021-02-04 NOTE — Progress Notes (Signed)
Patient admitted for possible stroke/TIA. Neuro checks and vitals done q.2h with no change. Initially BP was elevated and prn med was given which corrected the BP. Patient can stand and pivot to [facility] with assistance, does have some weakness. She states she has intermittent burning in her legs due to neuropathy. Complains of numbness and tingling on her left side that has not worsened or gotten better. She was anxious and prn ativan was given shortly after arrival to the floor which helped her relax and sleep.

## 2021-02-04 NOTE — Evaluation (Signed)
Occupational Therapy Evaluation Patient Details Name: [name] MRN: [id number] DOB: November 23, 1931 Today's Date: 02/04/2021   History of Present Illness [name]  is a 88 y.o. female, with medical history significant for atrial fibrillation and DVT, on Eliquis, coronary artery disease with AICD, hypertension, hyperlipidemia, hypothyroidism.  -Patient presents to ED secondary to weakness, presents from home, she was at urologist office because of history of kidney stones, was back into the house around 11:30, she was able to walk back into the house, 2 hours after she walked into the house around 1:30, she tried to get out of bed and walk, she could not stand up, and she felt her left leg is weak, and not feel right, the as well reports left-sided tingling and numbness yesterday when she woke up from sleep, she denies any loss of consciousness, vision changes, altered mental status, speech problems, she was noted to have left-sided weakness by EMS.  -ED code stroke was called initially, but after discussing with neurology on-call at [facility] it was canceled given patient hypertensive, on Eliquis, and no significant focal deficits present while in ED, CT head with no acute findings, CTA head and neck with a small vessel disease, MRI could not be obtained due to ICD, [facility] hospitalist consulted to admit.   Clinical Impression   Pt assisted by care aide for ADL's at baseline. Pt demonstrates notable weakness in L UE and unsteadiness on feet during functional ambulation and transfers with use of RW. Pt reports increase in blurred vision with severe vision issues at baseline with report of macular degeneration. Care aide only present 8 hours a day putting pt at risk for return visit due to falling if not given additional rehab. Pt will benefit from continued OT in the hospital and recommended venue below to increase strength, balance, and endurance for safe ADL's.        Recommendations for  follow up therapy are one component of a multi-disciplinary discharge planning process, led by the attending physician.  Recommendations may be updated based on patient status, additional functional criteria and insurance authorization.   Follow Up Recommendations  Skilled nursing-short term rehab (<3 hours/day)    Assistance Recommended at Discharge Intermittent Supervision/Assistance  Patient can return home with the following A little help with walking and/or transfers;A lot of help with bathing/dressing/bathroom;Assistance with cooking/housework;Assist for transportation;Help with stairs or ramp for entrance    Functional Status Assessment  Patient has had a recent decline in their functional status and demonstrates the ability to make significant improvements in function in a reasonable and predictable amount of time.  Equipment Recommendations  Other (comment) (Leg lifter to use for shower transfers with tub bench.)    Recommendations for Other Services       Precautions / Restrictions Precautions Precautions: Fall Restrictions Weight Bearing Restrictions: No      Mobility Bed Mobility Overal bed mobility: Needs Assistance Bed Mobility: Supine to Sit, Sit to Supine     Supine to sit: Min assist Sit to supine: Min guard   General bed mobility comments: assist to pull to seated EOB, labored, unsteady    Transfers Overall transfer level: Needs assistance Equipment used: Rolling walker (2 wheels) Transfers: Sit to/from Stand, Bed to chair/wheelchair/BSC Sit to Stand: Min assist, Mod assist     Step pivot transfers: Min assist, Mod assist     General transfer comment: labored, unsteady with RW, assist for weakness/balance      Balance Overall balance assessment: Needs assistance  Sitting-balance support: No upper extremity supported, Feet supported Sitting balance-Leahy Scale: Good Sitting balance - Comments: fair/good seated EOB   Standing balance support:  Bilateral upper extremity supported, Reliant on assistive device for balance Standing balance-Leahy Scale: Poor Standing balance comment: with RW                           ADL either performed or assessed with clinical judgement   ADL Overall ADL's : Needs assistance/impaired     Grooming: Minimal assistance;Moderate assistance;Standing       Lower Body Bathing: Maximal assistance;Total assistance;Sitting/lateral leans   Upper Body Dressing : Set up;Sitting   Lower Body Dressing: Total assistance;Sitting/lateral leans Lower Body Dressing Details (indicate cue type and reason): Donning socks at EOB. Toilet Transfer: Minimal assistance;Moderate assistance;Stand-pivot;Rolling walker (2 wheels)           Functional mobility during ADLs: Minimal assistance;Moderate assistance;Rolling walker (2 wheels) General ADL Comments: Labored unsteady movement when standing for functional transfers and ambulation. Pt is assisted by care aide at baseline.     Vision Baseline Vision/History: 6 Macular Degeneration Ability to See in Adequate Light: 3 Highly impaired (L eye is nearly blind per pt report.) Patient Visual Report: Blurring of vision (Pt reports vision has been more blurry the last few days.) Vision Assessment?: Yes Additional Comments: Pt has severe vision deficits at baseline and was unable to track a stiumuls this date.     Perception     Praxis      Pertinent Vitals/Pain Pain Assessment Pain Assessment: 0-10 Pain Score: 6  Pain Location: stomach ache; LLE numbness Pain Descriptors / Indicators: Aching, Pins and needles, Numbness Pain Intervention(s): Limited activity within patient's tolerance, Monitored during session, Repositioned     Hand Dominance Right   Extremity/Trunk Assessment Upper Extremity Assessment Upper Extremity Assessment: LUE deficits/detail LUE Deficits / Details: 4-/5 grossly; decreased light touch LUE Sensation: decreased light  touch LUE Coordination: decreased fine motor;decreased gross motor (Tremors noted.)   Lower Extremity Assessment Lower Extremity Assessment: Defer to PT evaluation LLE Deficits / Details: LLE decreased and grossly 3+ to 4-/5 MMT LLE Sensation: decreased light touch LLE Coordination: decreased gross motor   Cervical / Trunk Assessment Cervical / Trunk Assessment: Kyphotic   Communication Communication Communication: HOH   Cognition Arousal/Alertness: Awake/alert Behavior During Therapy: WFL for tasks assessed/performed Overall Cognitive Status: Within Functional Limits for tasks assessed                                       General Comments       Exercises     Shoulder Instructions      Home Living Family/patient expects to be discharged to:: Private residence Living Arrangements: Alone Available Help at Discharge: Family;Personal care attendant;Available PRN/intermittently Type of Home: House Home Access: Stairs to enter [employer] of Steps: 1 Entrance Stairs-Rails: None Home Layout: One level;Laundry or work area in basement;Able to live on main level with bedroom/bathroom     Bathroom Shower/Tub: Teacher, early years/pre: Handicapped height Bathroom Accessibility: Yes   Home Equipment: [place] (4 wheels);Cane - quad;Cane - single point;BSC/3in1;Tub bench;Wheelchair - manual;Grab bars - tub/shower   Additional Comments: family lives across the street; PCA 8 hours 5x/week but aid has been sick; alone at night      Prior Functioning/Environment Prior Level of Function : Needs assist  Mobility Comments: Patient states household ambulation with rollator, aid assists ADLs Comments: aid/family assist        OT Problem List: Decreased strength;Decreased activity tolerance;Impaired balance (sitting and/or standing);Impaired vision/perception;Decreased coordination      OT Treatment/Interventions: Therapeutic  exercise;Self-care/ADL training;Therapeutic activities;Patient/family education;Balance training;Neuromuscular education    OT Goals(Current goals can be found in the care plan section) Acute Rehab OT Goals Patient Stated Goal: return home OT Goal Formulation: With patient Time For Goal Achievement: 02/18/21 Potential to Achieve Goals: Fair  OT Frequency: Min 2X/week    Co-evaluation PT/OT/SLP Co-Evaluation/Treatment: Yes Reason for Co-Treatment: To address functional/ADL transfers PT goals addressed during session: Mobility/safety with mobility;Balance;Proper use of DME;Strengthening/ROM OT goals addressed during session: ADL's and self-care      AM-PAC OT "6 Clicks" Daily Activity     Outcome Measure Help from another person eating meals?: A Little Help from another person taking care of personal grooming?: A Lot Help from another person toileting, which includes using toliet, bedpan, or urinal?: A Lot Help from another person bathing (including washing, rinsing, drying)?: A Lot Help from another person to put on and taking off regular upper body clothing?: A Little Help from another person to put on and taking off regular lower body clothing?: A Lot 6 Click Score: 14   End of Session Equipment Utilized During Treatment: Rolling walker (2 wheels);Gait belt  Activity Tolerance: Patient tolerated treatment well Patient left: in bed;with call bell/phone within reach  OT Visit Diagnosis: Muscle weakness (generalized) (M62.81);Unsteadiness on feet (R26.81);Other abnormalities of gait and mobility (R26.89);Hemiplegia and hemiparesis;Ataxia, unspecified (R27.0) Hemiplegia - Right/Left: Left Hemiplegia - dominant/non-dominant: Non-Dominant Hemiplegia - caused by:  (uknown at this time)                Time: 4696-2952 OT Time Calculation (min): 34 min Charges:  OT General Charges $OT Visit: 1 Visit OT Evaluation $OT Eval Moderate Complexity: 1 Mod  Anakaren Campion OT,  MOT  Larey Seat 02/04/2021, 12:22 PM

## 2021-02-04 NOTE — Progress Notes (Signed)
SLP Cancellation Note  Patient Details Name: [name] MRN: [id number] DOB: [birth year]   Cancelled treatment:       Reason Eval/Treat Not Completed: Patient at procedure or test/unavailable; pt off the floor when evaluation attempted.  ST will continue efforts.   Elvina Sidle, M.S., CCC-SLP 02/04/2021, 2:08 PM

## 2021-02-05 NOTE — Progress Notes (Signed)
PROGRESS NOTE    [name]  XBD:[id number] DOB: 1931-07-01 DOA: 02/03/2021  PCP: [name], DO   Brief Narrative:  This 88 years old female with PMH significant for atrial fibrillation and DVT on Eliquis, coronary artery disease with AICD, hypertension, hyperlipidemia, hypothyroidism presented in the ED with complaints of left-sided weakness.  Patient reports she was at urologist office because of kidney stones in the morning and she came back home,  She was able to walk at home.  She went to the bed and 2 hours after she woke up and was not able to get out of bed and walk, she could not stand and felt her left leg is weak.  She also reported left-sided tingling and numbness after she woke up from sleep.  She denies any loss of consciousness, visual changes, altered mentation, speech problems.  She was also noted to have left-sided weakness by EMS.  Patient was assessed by tele neurology, deemed not a candidate for tPA given being on Eliquis,, hypertensive, with no significant focal deficits.  CT head no acute findings, CTA head and neck showed small vessel disease.  MRI could not be obtained due to AICD.   Assessment & Plan:   Principal Problem:   TIA (transient ischemic attack) Active Problems:   CKD (chronic kidney disease), stage III (HCC)   ICD (implantable cardioverter-defibrillator) battery depletion   Diabetes mellitus type 2, diet-controlled ([place])   Essential hypertension   GERD (gastroesophageal reflux disease)   Atrial fibrillation, chronic (HCC)   CAD (coronary artery disease)   Hypothyroidism  Acute CVA versus TIA: Patient presented with left-sided weakness, tingling and numbness. She was also noted to have left-sided facial droop on physical exam and left tongue deviation. CT head without any acute findings.  CTA head and neck showed small vessel disease. Tele- neurologist was consulted by ED physician. Patient does not deemed candidate for tPA given she is  on Eliquis, hypertensive and has no focal signs of acute CVA on CT scan, CTA does not support large vessel occlusion. Patient was admitted for CVA work-up. MRI could not be obtained due to patient has noncompatible AICD. LDL 69 under goal, hemoglobin A1c 6.5, Echo shows LVEF 50 to 55%. Repeat CT head without acute stroke. Continue with Eliquis, aspirin and Crestor. PT and OT recommended  skilled nursing facility for rehab. Allow permissive hypertension.  Normalize BP in 2 to 3 days. Follow-up neurology Dr. Merlene Laughter in 4 weeks as an outpatient.  CKD stage IIIa: Creatinine at baseline.   Continue to monitor  Chronic atrial fibrillation: Continue Eliquis, Continue with amiodarone for heart rate control.   Resume Toprol-XL once permissive hypertension over.  History of DVT: Continue Eliquis.  CAD: She denies any chest pain or shortness of breath. Continue with statin, resume metoprolol once stable.  GERD: Continue pantoprazole.  Hyperlipidemia: Continue Crestor.  Hypothyroidism: Continue levothyroxine  DVT prophylaxis: Eliquis Code Status: DNR Family Communication: No family at bedside Disposition Plan:     Status is: Inpatient  Remains inpatient appropriate because:  Admitted for TIA/CVA work-up.  MRI could not be obtained due to AICD.  Neurology consulted, stroke work-up completed.  PT recommended SNF.    Consultants:  Neurology  Procedures: CT head, CT head and neck Antimicrobials: None  Subjective: Patient was seen and examined at bedside.  Overnight events noted.   Patient still reports having left-sided weakness.  Patient has participated in physical therapy, recommended SNF. Patient wants to go home and states she  has very supportive family.   Objective: Vitals:   02/04/21 2211 02/05/21 0200 02/05/21 0529 02/05/21 1009  BP: (!) 141/80 (!) 145/84 (!) 159/98 (!) 154/76  Pulse: 65 61 69 62  Resp: 18 18 18 20   Temp: 98.1 F (36.7 C) 98 F (36.7 C)  98.6 F (37 C) 98.2 F (36.8 C)  TempSrc: Oral Oral  Oral  SpO2: 99% 98% 100% 97%  Weight:      Height:        Intake/Output Summary (Last 24 hours) at 02/05/2021 1048 Last data filed at 02/05/2021 0500 Gross per 24 hour  Intake 360 ml  Output --  Net 360 ml   Filed Weights   02/03/21 1715 02/03/21 2040  Weight: 54.4 kg 58.5 kg    Examination:  General exam: Appears comfortable, not in any acute distress.  Deconditioned. Respiratory system: Clear to auscultation bilaterally, respiratory effort normal, RR 13. Cardiovascular system: S1-S2 heard, regular rate and rhythm, no murmur. Gastrointestinal system: Abdomen is soft, nontender, nondistended, BS+ Central nervous system: Alert and oriented X 3.  Left-sided weakness+ Extremities: No edema no cyanosis, no clubbing. Skin: No rashes, lesions or ulcers Psychiatry: Judgement and insight appear normal. Mood & affect appropriate.     Data Reviewed: I have personally reviewed following labs and imaging studies  CBC: Recent Labs  Lab 02/03/21 1645 02/04/21 0431  WBC 6.4 5.2  NEUTROABS 3.5  --   HGB 12.2   13.3 11.9*  HCT 36.1   39.0 36.6  MCV 96.0 94.8  PLT 219 761   Basic Metabolic Panel: Recent Labs  Lab 02/03/21 1645 02/04/21 0431  NA 137   138 142  K 3.6   4.4 3.5  CL 105   106 107  CO2 24 24  GLUCOSE 112*   110* 94  BUN 21   28* 16  CREATININE 1.19*   1.20* 0.91  CALCIUM 9.0 8.9   GFR: Estimated Creatinine Clearance: 36 mL/min (by C-G formula based on SCr of 0.91 mg/dL). Liver Function Tests: Recent Labs  Lab 02/03/21 1645  AST 27  ALT 16  ALKPHOS 67  BILITOT 1.1  PROT 6.9  ALBUMIN 3.7   No results for input(s): LIPASE, AMYLASE in the last 168 hours. No results for input(s): AMMONIA in the last 168 hours. Coagulation Profile: Recent Labs  Lab 02/03/21 1724  INR 1.1   Cardiac Enzymes: No results for input(s): CKTOTAL, CKMB, CKMBINDEX, TROPONINI in the last 168 hours. BNP (last 3  results) No results for input(s): PROBNP in the last 8760 hours. HbA1C: Recent Labs    02/04/21 0431  HGBA1C 6.5*   CBG: Recent Labs  Lab 02/03/21 1638  GLUCAP 88   Lipid Profile: Recent Labs    02/04/21 0431  CHOL 150  HDL 72  LDLCALC 69  TRIG 45  CHOLHDL 2.1   Thyroid Function Tests: No results for input(s): TSH, T4TOTAL, FREET4, T3FREE, THYROIDAB in the last 72 hours. Anemia Panel: No results for input(s): VITAMINB12, FOLATE, FERRITIN, TIBC, IRON, RETICCTPCT in the last 72 hours. Sepsis Labs: No results for input(s): PROCALCITON, LATICACIDVEN in the last 168 hours.  Recent Results (from the past 240 hour(s))  Resp Panel by RT-PCR (Flu A&B, Covid) Nasopharyngeal Swab     Status: None   Collection Time: 02/03/21  5:26 PM   Specimen: Nasopharyngeal Swab; Nasopharyngeal(NP) swabs in vial transport medium  Result Value Ref Range Status   SARS Coronavirus 2 by RT PCR NEGATIVE NEGATIVE Final  Comment: (NOTE) SARS-CoV-2 target nucleic acids are NOT DETECTED.  The SARS-CoV-2 RNA is generally detectable in upper respiratory specimens during the acute phase of infection. The lowest concentration of SARS-CoV-2 viral copies this assay can detect is 138 copies/mL. A negative result does not preclude SARS-Cov-2 infection and should not be used as the sole basis for treatment or other patient management decisions. A negative result may occur with  improper specimen collection/handling, submission of specimen other than nasopharyngeal swab, presence of viral mutation(s) within the areas targeted by this assay, and inadequate number of viral copies(<138 copies/mL). A negative result must be combined with clinical observations, patient history, and epidemiological information. The expected result is Negative.  Fact Sheet for Patients:  EntrepreneurPulse.com.au  Fact Sheet for Healthcare Providers:  IncredibleEmployment.be  This test is  no t yet approved or cleared by the [place] FDA and  has been authorized for detection and/or diagnosis of SARS-CoV-2 by FDA under an Emergency Use Authorization (EUA). This EUA will remain  in effect (meaning this test can be used) for the duration of the COVID-19 declaration under Section 564(b)(1) of the Act, 21 U.S.C.section 360bbb-3(b)(1), unless the authorization is terminated  or revoked sooner.       Influenza A by PCR NEGATIVE NEGATIVE Final   Influenza B by PCR NEGATIVE NEGATIVE Final    Comment: (NOTE) The Xpert Xpress SARS-CoV-2/FLU/RSV plus assay is intended as an aid in the diagnosis of influenza from Nasopharyngeal swab specimens and should not be used as a sole basis for treatment. Nasal washings and aspirates are unacceptable for Xpert Xpress SARS-CoV-2/FLU/RSV testing.  Fact Sheet for Patients: EntrepreneurPulse.com.au  Fact Sheet for Healthcare Providers: IncredibleEmployment.be  This test is not yet approved or cleared by the [place] FDA and has been authorized for detection and/or diagnosis of SARS-CoV-2 by FDA under an Emergency Use Authorization (EUA). This EUA will remain in effect (meaning this test can be used) for the duration of the COVID-19 declaration under Section 564(b)(1) of the Act, 21 U.S.C. section 360bbb-3(b)(1), unless the authorization is terminated or revoked.  Performed at [facility], [address], [place], [place] [address]     Radiology Studies: CT Angio Head W or Wo Contrast  Result Date: 02/03/2021 CLINICAL DATA:  Left-sided weakness, stroke suspected EXAM: CT ANGIOGRAPHY HEAD AND NECK TECHNIQUE: Multidetector CT imaging of the head and neck was performed using the standard protocol during bolus administration of intravenous contrast. Multiplanar CT image reconstructions and MIPs were obtained to evaluate the vascular anatomy. Carotid stenosis measurements (when applicable) are  obtained utilizing NASCET criteria, using the distal internal carotid diameter as the denominator. RADIATION DOSE REDUCTION: This exam was performed according to the departmental dose-optimization program which includes automated exposure control, adjustment of the mA and/or kV according to patient size and/or use of iterative reconstruction technique. CONTRAST:  34mL OMNIPAQUE IOHEXOL 350 MG/ML SOLN COMPARISON:  No prior CTA, correlation is made with 02/03/2021 CT head FINDINGS: CT HEAD FINDINGS For noncontrast findings, please see same day CT head. CTA NECK FINDINGS Aortic arch: Standard branching. Imaged portion shows no evidence of aneurysm or dissection. No significant stenosis of the major arch vessel origins. Aortic atherosclerosis. Right carotid system: No evidence of dissection, stenosis (50% or greater) or occlusion. Left carotid system: No evidence of dissection, stenosis (50% or greater) or occlusion. Vertebral arteries: Codominant. Calcifications at the origin of the right vertebral artery, which cause moderate stenosis; the right vertebral artery is otherwise patent. No evidence of dissection or  occlusion. No other evidence of significant stenosis. Skeleton: No acute osseous abnormality. Other neck: Negative. Upper chest: Mild interlobular septal thickening and ground-glass opacities, possibly mild pulmonary edema. No pleural effusion. Review of the MIP images confirms the above findings CTA HEAD FINDINGS Anterior circulation: Both internal carotid arteries are patent to the termini, without mild calcifications but without significant stenosis A1 segments patent. Normal anterior communicating artery. Anterior cerebral arteries are patent to their distal aspects. No M1 stenosis or occlusion. Normal MCA bifurcations. Distal MCA branches perfused and symmetric. Posterior circulation: Vertebral arteries patent to the vertebrobasilar junction without stenosis. Posterior inferior cerebral arteries patent  bilaterally. Basilar patent to its distal aspect. Superior cerebellar arteries patent bilaterally. Bilateral P1 segments originate from the basilar artery. Multifocal narrowing in the bilateral P2 and P3 segments (series 505, images 216 and 215 on the on the right, and images 201, 215 and 225 on the left). Poor opacification of the left P3 segments distally, although evaluation is limited by venous contamination. Bilateral posterior communicating arteries are not visualized. Venous sinuses: As permitted by contrast timing, patent. Anatomic variants: None significant Review of the MIP images confirms the above findings IMPRESSION: 1. Multifocal narrowing in the bilateral P2 and P3 segments, with poor opacification of the distal P3 segments. No other significant intracranial stenosis. 2. Moderate narrowing at the origin of the right vertebral artery, which is otherwise patent. No other hemodynamically significant stenosis in the neck. These results were called by telephone at the time of interpretation on 02/03/2021 at 6:29 pm to provider [facility] , who verbally acknowledged these results. Electronically Signed   By: Merilyn Baba M.D.   On: 02/03/2021 18:30   CT HEAD WO CONTRAST (5MM)  Result Date: 02/04/2021 CLINICAL DATA:  Stroke follow-up EXAM: CT HEAD WITHOUT CONTRAST TECHNIQUE: Contiguous axial images were obtained from the base of the skull through the vertex without intravenous contrast. RADIATION DOSE REDUCTION: This exam was performed according to the departmental dose-optimization program which includes automated exposure control, adjustment of the mA and/or kV according to patient size and/or use of iterative reconstruction technique. COMPARISON:  CT head 02/03/2021 FINDINGS: Brain: Mild atrophy. Moderate white matter changes with patchy hypodensity throughout the cerebral white matter bilaterally. Ill-defined hypodensity left thalamus unchanged. Negative for acute cortical infarct, hemorrhage, mass  Vascular: Negative for hyperdense vessel Skull: Negative Sinuses/Orbits: Mild mucosal edema right maxillary sinus. Remaining sinuses clear. Right cataract extraction Other: None IMPRESSION: Atrophy and chronic microvascular ischemic change. No acute cortical infarct. Left thalamic hypodensity compatible with infarct of indeterminate age Negative for hemorrhage. Electronically Signed   By: Franchot Gallo M.D.   On: 02/04/2021 14:11   CT Angio Neck W and/or Wo Contrast  Result Date: 02/03/2021 CLINICAL DATA:  Left-sided weakness, stroke suspected EXAM: CT ANGIOGRAPHY HEAD AND NECK TECHNIQUE: Multidetector CT imaging of the head and neck was performed using the standard protocol during bolus administration of intravenous contrast. Multiplanar CT image reconstructions and MIPs were obtained to evaluate the vascular anatomy. Carotid stenosis measurements (when applicable) are obtained utilizing NASCET criteria, using the distal internal carotid diameter as the denominator. RADIATION DOSE REDUCTION: This exam was performed according to the departmental dose-optimization program which includes automated exposure control, adjustment of the mA and/or kV according to patient size and/or use of iterative reconstruction technique. CONTRAST:  60mL OMNIPAQUE IOHEXOL 350 MG/ML SOLN COMPARISON:  No prior CTA, correlation is made with 02/03/2021 CT head FINDINGS: CT HEAD FINDINGS For noncontrast findings, please see same day CT head.  CTA NECK FINDINGS Aortic arch: Standard branching. Imaged portion shows no evidence of aneurysm or dissection. No significant stenosis of the major arch vessel origins. Aortic atherosclerosis. Right carotid system: No evidence of dissection, stenosis (50% or greater) or occlusion. Left carotid system: No evidence of dissection, stenosis (50% or greater) or occlusion. Vertebral arteries: Codominant. Calcifications at the origin of the right vertebral artery, which cause moderate stenosis; the right  vertebral artery is otherwise patent. No evidence of dissection or occlusion. No other evidence of significant stenosis. Skeleton: No acute osseous abnormality. Other neck: Negative. Upper chest: Mild interlobular septal thickening and ground-glass opacities, possibly mild pulmonary edema. No pleural effusion. Review of the MIP images confirms the above findings CTA HEAD FINDINGS Anterior circulation: Both internal carotid arteries are patent to the termini, without mild calcifications but without significant stenosis A1 segments patent. Normal anterior communicating artery. Anterior cerebral arteries are patent to their distal aspects. No M1 stenosis or occlusion. Normal MCA bifurcations. Distal MCA branches perfused and symmetric. Posterior circulation: Vertebral arteries patent to the vertebrobasilar junction without stenosis. Posterior inferior cerebral arteries patent bilaterally. Basilar patent to its distal aspect. Superior cerebellar arteries patent bilaterally. Bilateral P1 segments originate from the basilar artery. Multifocal narrowing in the bilateral P2 and P3 segments (series 505, images 216 and 215 on the on the right, and images 201, 215 and 225 on the left). Poor opacification of the left P3 segments distally, although evaluation is limited by venous contamination. Bilateral posterior communicating arteries are not visualized. Venous sinuses: As permitted by contrast timing, patent. Anatomic variants: None significant Review of the MIP images confirms the above findings IMPRESSION: 1. Multifocal narrowing in the bilateral P2 and P3 segments, with poor opacification of the distal P3 segments. No other significant intracranial stenosis. 2. Moderate narrowing at the origin of the right vertebral artery, which is otherwise patent. No other hemodynamically significant stenosis in the neck. These results were called by telephone at the time of interpretation on 02/03/2021 at 6:29 pm to provider [facility] , who verbally acknowledged these results. Electronically Signed   By: Merilyn Baba M.D.   On: 02/03/2021 18:30   ECHOCARDIOGRAM COMPLETE  Result Date: 02/04/2021    ECHOCARDIOGRAM REPORT   Patient Name:   FRIMY UFFELMAN Date of Exam: 02/04/2021 Medical Rec #:  [id number]        Height:       63.0 in Accession #:    [id number]       Weight:       129.0 lb Date of Birth:  1931/02/20        BSA:          1.605 m Patient Age:    49 years         BP:           145/65 mmHg Patient Gender: F                HR:           56 bpm. Exam Location:  [facility] Procedure: 2D Echo, Cardiac Doppler and Color Doppler Indications:    TIA  History:        Patient has prior history of Echocardiogram examinations, most                 recent 11/20/2019. CAD, Defibrillator, TIA,                 Arrythmias:Bradycardia and Atrial Fibrillation,  Signs/Symptoms:Chest Pain; Risk Factors:Hypertension, Diabetes                 and Dyslipidemia.  Sonographer:    Wenda Low Referring Phys: [place]  1. Left ventricular ejection fraction, by estimation, is 50 to 55%. The left ventricle has low normal function. The left ventricle has no regional wall motion abnormalities. There is mild concentric left ventricular hypertrophy. Left ventricular diastolic parameters are consistent with Grade I diastolic dysfunction (impaired relaxation).  2. Right ventricular systolic function is normal. The right ventricular size is normal.  3. Left atrial size was severely dilated.  4. The mitral valve is degenerative. Trivial mitral valve regurgitation. No evidence of mitral stenosis.  5. The aortic valve is tricuspid. Aortic valve regurgitation is mild. No aortic stenosis is present.  6. Aortic dilatation noted. There is mild dilatation of the ascending aorta, measuring 41 mm. Comparison(s): No significant change from prior study. Conclusion(s)/Recommendation(s): No intracardiac source of embolism  detected on this transthoracic study. Consider a transesophageal echocardiogram to exclude cardiac source of embolism if clinically indicated. FINDINGS  Left Ventricle: Left ventricular ejection fraction, by estimation, is 50 to 55%. The left ventricle has low normal function. The left ventricle has no regional wall motion abnormalities. The left ventricular internal cavity size was normal in size. There is mild concentric left ventricular hypertrophy. Left ventricular diastolic parameters are consistent with Grade I diastolic dysfunction (impaired relaxation). Right Ventricle: The right ventricular size is normal. No increase in right ventricular wall thickness. Right ventricular systolic function is normal. Left Atrium: Left atrial size was severely dilated. Right Atrium: Right atrial size was normal in size. Pericardium: There is no evidence of pericardial effusion. Mitral Valve: The mitral valve is degenerative in appearance. Mild to moderate mitral annular calcification. Trivial mitral valve regurgitation. No evidence of mitral valve stenosis. MV peak gradient, 2.1 mmHg. The mean mitral valve gradient is 1.0 mmHg. Tricuspid Valve: The tricuspid valve is grossly normal. Tricuspid valve regurgitation is mild . No evidence of tricuspid stenosis. Aortic Valve: The aortic valve is tricuspid. Aortic valve regurgitation is mild. No aortic stenosis is present. Aortic valve mean gradient measures 3.0 mmHg. Aortic valve peak gradient measures 5.5 mmHg. Aortic valve area, by VTI measures 2.28 cm. Pulmonic Valve: The pulmonic valve was grossly normal. Pulmonic valve regurgitation is trivial. No evidence of pulmonic stenosis. Aorta: Aortic dilatation noted. There is mild dilatation of the ascending aorta, measuring 41 mm. Venous: The inferior vena cava was not well visualized. IAS/Shunts: The atrial septum is grossly normal. Additional Comments: A device lead is visualized in the right atrium and right ventricle.  LEFT  VENTRICLE PLAX 2D LVIDd:         4.90 cm     Diastology LVIDs:         3.40 cm     LV e' medial:    4.95 cm/s LV PW:         1.20 cm     LV E/e' medial:  10.6 LV IVS:        1.20 cm     LV e' lateral:   8.85 cm/s LVOT diam:     2.00 cm     LV E/e' lateral: 5.9 LV SV:         63 LV SV Index:   40 LVOT Area:     3.14 cm  LV Volumes (MOD) LV vol d, MOD A2C: 46.3 ml LV vol d, MOD A4C: 39.5 ml  LV vol s, MOD A2C: 23.8 ml LV vol s, MOD A4C: 14.8 ml LV SV MOD A2C:     22.5 ml LV SV MOD A4C:     39.5 ml LV SV MOD BP:      24.6 ml RIGHT VENTRICLE RV Basal diam:  3.35 cm RV Mid diam:    2.50 cm RV S prime:     9.68 cm/s TAPSE (M-mode): 2.4 cm LEFT ATRIUM             Index        RIGHT ATRIUM           Index LA diam:        4.10 cm 2.55 cm/m   RA Area:     16.70 cm LA Vol (A2C):   97.5 ml 60.76 ml/m  RA Volume:   44.80 ml  27.92 ml/m LA Vol (A4C):   80.6 ml 50.23 ml/m LA Biplane Vol: 91.7 ml 57.14 ml/m  AORTIC VALVE                    PULMONIC VALVE AV Area (Vmax):    2.40 cm     PV Vmax:       0.77 m/s AV Area (Vmean):   2.33 cm     PV Peak grad:  2.4 mmHg AV Area (VTI):     2.28 cm AV Vmax:           117.00 cm/s AV Vmean:          76.200 cm/s AV VTI:            0.278 m AV Peak Grad:      5.5 mmHg AV Mean Grad:      3.0 mmHg LVOT Vmax:         89.40 cm/s LVOT Vmean:        56.500 cm/s LVOT VTI:          0.202 m LVOT/AV VTI ratio: 0.73  AORTA Ao Root diam: 3.30 cm Ao Asc diam:  4.10 cm MITRAL VALVE               TRICUSPID VALVE MV Area (PHT): 2.22 cm    TR Peak grad:   27.0 mmHg MV Area VTI:   2.39 cm    TR Vmax:        260.00 cm/s MV Peak grad:  2.1 mmHg MV Mean grad:  1.0 mmHg    SHUNTS MV Vmax:       0.73 m/s    Systemic VTI:  0.20 m MV Vmean:      46.9 cm/s   Systemic Diam: 2.00 cm MV Decel Time: 341 msec MV E velocity: 52.30 cm/s MV A velocity: 74.10 cm/s MV E/A ratio:  0.71 Eleonore Chiquito MD Electronically signed by Eleonore Chiquito MD Signature Date/Time: 02/04/2021/12:12:36 PM    Final    CT HEAD CODE STROKE WO  CONTRAST  Result Date: 02/03/2021 CLINICAL DATA:  Code stroke. EXAM: CT HEAD WITHOUT CONTRAST TECHNIQUE: Contiguous axial images were obtained from the base of the skull through the vertex without intravenous contrast. RADIATION DOSE REDUCTION: This exam was performed according to the departmental dose-optimization program which includes automated exposure control, adjustment of the mA and/or kV according to patient size and/or use of iterative reconstruction technique. COMPARISON:  CT head 08/28/2018 FINDINGS: Brain: There is no evidence of acute intracranial hemorrhage, extra-axial fluid collection, or acute infarct. There is a background of mild global parenchymal volume  loss with prominence of the ventricular system and extra-axial CSF spaces. There is hypodensity in the left thalamus which is increased in conspicuity compared to the study from 2020 but is favored to reflect a remote lacunar infarct. Additional hypodensity in the subcortical and periventricular white matter likely reflects sequela of moderate chronic white matter microangiopathy. There is no mass lesion.  There is no midline shift. Vascular: There is calcification of the bilateral cavernous ICAs. Skull: Normal. Negative for fracture or focal lesion. Sinuses/Orbits: The imaged paranasal sinuses are clear. A right lens implant is noted. The globes and orbits are otherwise unremarkable. Other: None. ASPECTS [facility] Stroke Program Early CT Score) - Ganglionic level infarction (caudate, lentiform nuclei, internal capsule, insula, M1-M3 cortex): 7 - Supraganglionic infarction (M4-M6 cortex): 3 Total score (0-10 with 10 being normal): 10 IMPRESSION: 1. No evidence of acute intracranial hemorrhage or infarct. 2. ASPECTS is 10 3. Small remote lacunar infarct in the left thalamus appears new since 2020. 4. Background of mild global parenchymal volume loss and moderate chronic white matter microangiopathy. These results were called by telephone at the  time of interpretation on 02/03/2021 at 4:56 pm to provider [facility] , who verbally acknowledged these results. Electronically Signed   By: Valetta Mole M.D.   On: 02/03/2021 16:59    Scheduled Meds:   stroke: mapping our early stages of recovery book   Does not apply Once   amiodarone  200 mg Oral Daily   apixaban  2.5 mg Oral BID   aspirin EC  81 mg Oral Daily   busPIRone  5 mg Oral BID   cholecalciferol  2,000 Units Oral q morning   feeding supplement  237 mL Oral BID BM   isosorbide mononitrate  60 mg Oral Daily   levothyroxine  100 mcg Oral Q0600   pantoprazole  40 mg Oral Daily   rosuvastatin  10 mg Oral Daily   Continuous Infusions:  sodium chloride 75 mL/hr at 02/05/21 0844     LOS: 1 day    Time spent: 35 mins    Leyanna Bittman, MD [facility]   If 7PM-7AM, please contact night-coverage

## 2021-02-05 NOTE — Progress Notes (Signed)
Patient admitted for TIA, patient continues on NIH stroke scale. Patient repositioned in bed and assisted to [facility]. Patient reports no complaints of pain/discomfort.

## 2021-02-05 NOTE — Progress Notes (Signed)
Pt c/o headache rating 6 and requested tyelnol.

## 2021-02-05 NOTE — Evaluation (Signed)
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[place]) 05/12/2020   Intractable abdominal pain 03/28/2020   Lactic acidosis 03/28/2020   Elevated troponin I level 03/28/2020   Malnutrition of moderate degree 12/26/2019   Prolonged QT interval 12/23/2019   Acute lower UTI 12/23/2019   Constipation 11/27/2019   Diarrhea 11/27/2019   Lesion of pancreas 11/26/2019   Fibromuscular dysplasia of right renal artery ([place]) 11/26/2019   Left renal artery stenosis (HCC) 11/26/2019   RUQ pain    Abnormal CT of the abdomen    Vertigo 11/15/2019   Advanced care planning/counseling discussion 11/15/2019   CAD (coronary artery disease)    Hypothyroidism    Hyponatremia    Volume depletion    TSH elevation 08/01/2019   Hospital discharge follow-up 08/01/2019   Exudative age-related macular degeneration of right eye with inactive choroidal neovascularization ([place]) 04/30/2019   Retinal hemorrhage of right eye 04/30/2019   Advanced nonexudative age-related macular degeneration of left eye with subfoveal involvement 04/30/2019   Exudative age-related macular degeneration of left eye with inactive choroidal neovascularization ([place]) 04/30/2019   Advanced nonexudative age-related macular degeneration of right eye with subfoveal involvement 04/30/2019   Posterior vitreous detachment of both eyes 04/30/2019   Vitreous degeneration, bilateral 04/30/2019   Secondary hypercoagulable state ([place]) 03/11/2019   Persistent atrial fibrillation (HCC)    Proctitis 01/09/2019   Atrial fibrillation, chronic ([place])  01/09/2019   Hyperlipidemia 01/09/2019   Nausea 01/09/2019   Hyperglycemia 01/09/2019   COVID-19 virus infection 12/28/2018   Chronic diarrhea 09/18/2018   Abdominal pain 09/18/2018   Osteoarthritis of right knee 03/29/2018   GERD (gastroesophageal reflux disease)    Dehydration 02/06/2018   Hypokalemia 02/06/2018   Diarrhea of presumed infectious origin 02/06/2018   Acute kidney injury superimposed on CKD ([place]) 02/05/2018   Essential hypertension 12/21/2017   Degenerative scoliosis 02/19/2017   Burst fracture of lumbar vertebra ([place]) 02/19/2017   History of drug-induced prolonged QT interval with torsade de pointes 11/01/2016   Iron deficiency anemia 10/30/2016   Long term current use of anticoagulant 02/17/2016   Near syncope 05/22/2015   Diabetes mellitus type 2, diet-controlled ([place]) 09/14/2014   ICD (implantable cardioverter-defibrillator) battery depletion 01/20/2014   Abnormal nuclear stress test, 12/30/13 12/31/2013   Coronary artery disease involving native coronary artery of native heart without angina pectoris 12/31/2013   Paroxysmal atrial fibrillation, chads2 Vasc2 score of 5, on eliquis 10/16/2013   Catecholaminergic polymorphic ventricular tachycardia 10/16/2013   Ureteral stone with hydronephrosis 27/06/2374   Metabolic syndrome 28/31/5176   Orthostatic hypotension 07/27/2011   Chest pain 07/25/2011   Weakness 07/25/2011    Class: Acute   Chronic sinus bradycardia 07/25/2011   ICD (implantable cardioverter-defibrillator) in place 07/25/2011   CKD (chronic kidney disease), stage III ([place]) 07/25/2011    Class: Chronic   History of DVT in the past, on eliquis now 07/25/2011    Class: History of   Nephrolithiasis, just saw Dr [name] 07/25/2011    Class: History of   DYSPHAGIA 03/24/2010   Chronic ischemic heart disease 12/03/2007   Irritable bowel syndrome with diarrhea 12/03/2007  EPIGASTRIC PAIN 12/03/2007   Hyperlipidemia associated with type 2  diabetes mellitus ([place]) 12/02/2007    Class: History of   EXTERNAL HEMORRHOIDS 12/02/2007    Class: History of   ESOPHAGITIS 12/02/2007   GASTRITIS 12/02/2007   DIVERTICULOSIS, COLON 12/02/2007   ARTHRITIS 12/02/2007    Class: Chronic   Past Medical History:  Past Medical History:  Diagnosis Date   AICD (automatic cardioverter/defibrillator) present    Allergy    SESONAL   Aneurysm of infrarenal abdominal aorta    Anxiety    ARTHRITIS    Arthritis    Atrial fibrillation (HCC)    CAD (coronary artery disease)    a. history of cardiac arrest 1993;  b. s/p LAD/LCX stenting in 2003;  c. 07/2011 Cath: LM nl, LAD patent stent, D1 80ost, LCX patent stent, RCA min irregs;  d. 04/2012 MV: EF 66%, no ishcemia;  e. 12/2013 Echo: Ef 55%, no rwma, Gr 1 DD, triv AI/MR, mildly dil LA;  f. 12/2013 Lexi MV: intermediate risk - apical ischemia and inf/infsept fixed defect, ? artifactual.   CAD in native artery 12/31/2013   Previous stents to LAD and Ramus patent on cath today 12/31/13 also There is severe disease in the ostial first diagonal which is unchanged from most recent cardiac catheterization. The right coronary artery could not be engaged selectively but nonselective angiography showed no significant disease in the proximal and midsegment.       Cataract    DENIES   Chronic back pain    Chronic sinus bradycardia    CKD (chronic kidney disease), stage III (HCC)    Clotting disorder ([place])    DVT   COLITIS 12/02/2007   Qualifier: Diagnosis of  By: [name], [name]     Diabetes mellitus without complication ([place])    DENIES   Diverticulosis of colon (without mention of hemorrhage) 2007   Colonoscopy    Esophageal stricture    a. 2012 s/p dil.   Esophagitis, unspecified    a. 2012 EGD   EXTERNAL HEMORRHOIDS    GERD (gastroesophageal reflux disease)    omeprazole   H/O hiatal hernia    Hiatal hernia    a. 2012 EGD.   History of DVT in the past, not on Coumadin now    left  leg    HYPERCHOLESTEROLEMIA    Hypertension    ICD (implantable cardiac defibrillator) in place    a. s/p initial ICD in 1993 in setting of cardiac arrest;  b. 01/2007 gen change: Guidant T135 Vitality DS VR single lead ICD.   Macular degeneration    gets injecton in eye every 5 weeks- last injection - 05/03/2013    Myocardial infarction [facility]) 1993   Near syncope 05/22/2015   Nephrolithiasis, just saw Dr [name]    Orthostatic hypotension    Peripheral vascular disease ([place])    ???   RECTAL BLEEDING 12/03/2007   Qualifier: Diagnosis of  By: [name]    Unspecified gastritis and gastroduodenitis without mention of hemorrhage    a. 2003 EGD->not noted on 2012 EGD.   Unstable angina, neg MI, cath stable.maybe GI 12/30/2013   Past Surgical History:  Past Surgical History:  Procedure Laterality Date   BREAST BIOPSY Left 11/2013   CARDIAC CATHETERIZATION     CARDIAC CATHETERIZATION  01/01/15   patent stents -there is severe disease in ostial 1st diag but without change.    CARDIAC DEFIBRILLATOR PLACEMENT  02/05/2007  Guidant   CARDIOVASCULAR STRESS TEST  12/30/2013   abnormal   CARDIOVERSION N/A 02/13/2019   Procedure: CARDIOVERSION;  Surgeon: Sanda Klein, MD;  Location: [place];  Service: Cardiovascular;  Laterality: N/A;   CATARACT EXTRACTION Right    Dr. Katy Fitch   COLONOSCOPY     COLONOSCOPY WITH PROPOFOL N/A 03/30/2020   Procedure: COLONOSCOPY WITH PROPOFOL;  Surgeon: Harvel Quale, MD;  [facility];  Service: Gastroenterology;  Laterality: N/A;   coronary stents      CYSTOSCOPY WITH URETEROSCOPY Right 05/20/2013   Procedure: CYSTOSCOPY, RIGHT URETEROSCOPY STONE EXTRACTION, Insertion of right DOUBLE J STENT ;  Surgeon: Irine Seal, MD;  Location: [facility];  Service: Urology;  Laterality: Right;   CYSTOSCOPY WITH URETEROSCOPY AND STENT PLACEMENT N/A 06/03/2013   Procedure: SECOND LOOK CYSTOSCOPY WITH URETEROSCOPY  HOLMIUM LASER LITHO AND  STONE EXTRACTION Sammie Bench ;  Surgeon: Malka So, MD;  Location: [facility];  Service: Urology;  Laterality: N/A;   ESOPHAGOGASTRODUODENOSCOPY (EGD) WITH PROPOFOL N/A 11/20/2019   Procedure: ESOPHAGOGASTRODUODENOSCOPY (EGD) WITH PROPOFOL;  Surgeon: Daneil Dolin, MD;  [facility];  Service: Endoscopy;  Laterality: N/A;   GIVENS CAPSULE STUDY N/A 10/30/2016   Procedure: GIVENS CAPSULE STUDY;  Surgeon: Irene Shipper, MD;  Location[facility] ENDOSCOPY;  Service: Endoscopy;  Laterality: N/A;  NEEDS TO BE ADMITTED FOR OBSERVATION PT. HAS DEFIB   HEMORROIDECTOMY  80's   HOLMIUM LASER APPLICATION Right 02/17/3084   Procedure: HOLMIUM LASER APPLICATION;  Surgeon: Irine Seal, MD;  Location: [facility];  Service: Urology;  Laterality: Right;   HYSTEROSCOPY WITH D & C  09/02/2010   Procedure: DILATATION AND CURETTAGE (D&C) /HYSTEROSCOPY;  Surgeon: Margarette Asal;  Location: [place] [facility];  Service: Gynecology;  Laterality: N/A;  Dilation and Curettage with Hysteroscopy and Polypectomy   IMPLANTABLE CARDIOVERTER DEFIBRILLATOR (ICD) GENERATOR CHANGE N/A 02/10/2014   Procedure: ICD GENERATOR CHANGE;  Surgeon: Sanda Klein, MD;  Location: [place] CATH LAB;  Service: Cardiovascular;  Laterality: N/A;   LEFT HEART CATHETERIZATION WITH CORONARY ANGIOGRAM N/A 07/28/2011   Procedure: LEFT HEART CATHETERIZATION WITH CORONARY ANGIOGRAM;  Surgeon: Lorretta Harp, MD;  [facility];  Service: Cardiovascular;  Laterality: N/A;   LEFT HEART CATHETERIZATION WITH CORONARY ANGIOGRAM N/A 12/31/2013   Procedure: LEFT HEART CATHETERIZATION WITH CORONARY ANGIOGRAM;  Surgeon: Wellington Hampshire, MD;  Location: [place] CATH LAB;  Service: Cardiovascular;  Laterality: N/A;   HPI:  This 88 years old female with PMH significant for atrial fibrillation and DVT on Eliquis, coronary artery disease with AICD, hypertension, hyperlipidemia, hypothyroidism presented in the ED with complaints of left-sided weakness.  Patient reports she was at  urologist office because of kidney stones in the morning and she came back home,  She was able to walk at home.  She went to the bed and 2 hours after she woke up and was not able to get out of bed and walk, she could not stand and felt her left leg is weak.  She also reported left-sided tingling and numbness after she woke up from sleep.  She denies any loss of consciousness, visual changes, altered mentation, speech problems.  She was also noted to have left-sided weakness by EMS.  Patient was assessed by tele neurology, deemed not a candidate for tPA given being on Eliquis,, hypertensive, with no significant focal deficits.  CT head no acute findings, CTA head and neck showed small vessel disease.  MRI could not be obtained due to  AICD. SLE requested.   Assessment / Plan / Recommendation Clinical Impression  Pt presents with mild cognitive linguistic deficits characterized by deficits in attention and memory. Pt required repetition x4 for immediate recall and then recalled 2/4 objects after a delay (benefited from category cue for 3/4 accuracy). She has assist at home for personal needs and Pt and family feels that Pt is at her baseline in terms of her cognition. Her voice quivers a bit and Pt has reduced breath support. Both the Pt and daughter report that his is worse than her baseline (she has a tremor in her hand). Pt is able to verbally express moderately complex thoughts and follow simple 2-step commands. She occasionally requires repetition of directions. No further acute SLP services indicated at this time. Can consider [facility] SLP services if Pt would like to work on memory strategies at home. Pt and family in agreement with plan of care. SLP will sign off.     SLP Assessment  SLP Recommendation/Assessment: All further Speech Lanaguage Pathology  needs can be addressed in the next venue of care SLP Visit Diagnosis: Cognitive communication deficit (R41.841)    Recommendations for follow up therapy  are one component of a multi-disciplinary discharge planning process, led by the attending physician.  Recommendations may be updated based on patient status, additional functional criteria and insurance authorization.    Follow Up Recommendations  Home health SLP    Assistance Recommended at Discharge  Frequent or constant Supervision/Assistance  Functional Status Assessment Patient has had a recent decline in their functional status and demonstrates the ability to make significant improvements in function in a reasonable and predictable amount of time.  Frequency and Duration           SLP Evaluation Cognition  Overall Cognitive Status: History of cognitive impairments - at baseline Arousal/Alertness: Awake/alert Orientation Level: Oriented to person;Oriented to place;Oriented to situation Year: 2023 Month: January Day of Week: Correct Attention: Sustained Sustained Attention: Impaired Sustained Attention Impairment: Verbal complex (months reverse 8/12) Memory: Impaired Memory Impairment: Storage deficit;Retrieval deficit (4 trials for 4/4 immediate, 2/4 delayed recall) Awareness: Appears intact Problem Solving: Appears intact Safety/Judgment: Appears intact       Comprehension  Auditory Comprehension Overall Auditory Comprehension: Appears within functional limits for tasks assessed Yes/No Questions: Within Functional Limits Commands: Within Functional Limits Conversation: Simple Interfering Components: Hearing;Visual impairments EffectiveTechniques: Increased volume;Repetition Visual Recognition/Discrimination Discrimination: Not tested Reading Comprehension Reading Status: Not tested    Expression Expression Primary Mode of Expression: Verbal Verbal Expression Overall Verbal Expression: Appears within functional limits for tasks assessed Initiation: No impairment Automatic Speech: Name;Social Response;Month of year Level of Generative/Spontaneous Verbalization:  Conversation Repetition: No impairment Naming: No impairment Pragmatics: No impairment Non-Verbal Means of Communication: Not applicable Written Expression Dominant Hand: Right Written Expression: Not tested   Oral / Motor  Oral Motor/Sensory Function Overall Oral Motor/Sensory Function: Within functional limits Motor Speech Overall Motor Speech: Impaired Respiration: Impaired (mild tremor) Level of Impairment: Sentence Phonation: Normal Resonance: Within functional limits Articulation: Within functional limitis Intelligibility: Intelligible Motor Planning: Witnin functional limits Motor Speech Errors: Not applicable           Thank you,  Genene Churn, [place] 02/05/2021, 11:45 AM

## 2021-02-06 MED ORDER — GABAPENTIN 100 MG PO CAPS
100.0000 mg | ORAL_CAPSULE | Freq: Every day | ORAL | Status: DC
  Administered 2021-02-07 (×2): 100 mg via ORAL
  Filled 2021-02-06 (×2): qty 1

## 2021-02-06 NOTE — Progress Notes (Signed)
PROGRESS NOTE    [name]  YNW:[id number] DOB: 1931/04/12 DOA: 02/03/2021  PCP: [name], DO   Brief Narrative:  This 88 years old female with PMH significant for atrial fibrillation and DVT on Eliquis, coronary artery disease with AICD, hypertension, hyperlipidemia, hypothyroidism presented in the ED with complaints of left-sided weakness.  Patient reports she was at urologist office because of kidney stones in the morning and she came back home,  She was able to walk at home.  She went to the bed and 2 hours after she woke up and was not able to get out of bed and walk, she could not stand and felt her left leg is weak.  She also reported left-sided tingling and numbness after she woke up from sleep.  She denies any loss of consciousness, visual changes, altered mentation, speech problems.  She was also noted to have left-sided weakness by EMS.  Patient was assessed by tele neurology, deemed not a candidate for tPA given being on Eliquis,, hypertensive, with no significant focal deficits.  CT head no acute findings, CTA head and neck showed small vessel disease.  MRI could not be obtained due to AICD.   Assessment & Plan:   Principal Problem:   TIA (transient ischemic attack) Active Problems:   CKD (chronic kidney disease), stage III (HCC)   ICD (implantable cardioverter-defibrillator) battery depletion   Diabetes mellitus type 2, diet-controlled ([place])   Essential hypertension   GERD (gastroesophageal reflux disease)   Atrial fibrillation, chronic (HCC)   CAD (coronary artery disease)   Hypothyroidism  Acute CVA : Patient presented with left-sided weakness, tingling and numbness. She was also noted to have left-sided facial droop on physical exam and left tongue deviation. CT head without any acute findings.  CTA head and neck showed small vessel disease. Tele- neurologist was consulted by ED physician. Patient does not deemed candidate for tPA given she is on  Eliquis, hypertensive and has no focal signs of acute CVA on CT scan, CTA does not support large vessel occlusion. Patient was admitted for CVA work-up. MRI could not be obtained due to patient has noncompatible AICD. LDL 69 under goal, hemoglobin A1c 6.5, Echo shows LVEF 50 to 55%. Repeat CT head without acute stroke. Continue with Eliquis, aspirin and Crestor. PT and OT recommended  skilled nursing facility for rehab. Allow permissive hypertension.  Normalize BP in 2 to 3 days. Follow-up neurology Dr. Merlene Laughter in 4 weeks as an outpatient.   CKD stage IIIa: Creatinine at baseline.   Continue to monitor  Chronic atrial fibrillation: Continue Eliquis, Continue with amiodarone for heart rate control.   Resume Toprol-XL once permissive hypertension over.  History of DVT: Continue Eliquis.  CAD: She denies any chest pain or shortness of breath. Continue with statin, resume metoprolol once stable.  GERD: Continue pantoprazole.  Hyperlipidemia: Continue Crestor.  Hypothyroidism: Continue levothyroxine  DVT prophylaxis: Eliquis Code Status: DNR Family Communication: No family at bedside Disposition Plan:     Status is: Inpatient  Remains inpatient appropriate because:  Admitted for TIA/CVA work-up.  MRI could not be obtained due to AICD.  Neurology consulted, stroke work-up completed.  PT recommended SNF.  Patient is agreeable for [facility].  Insurance authorization started.    Consultants:  Neurology  Procedures: CT head, CT head and neck Antimicrobials: None  Subjective: Patient was seen and examined at bedside.  Overnight events noted.   Patient still appears as having left-sided weakness. Patient has participated in physical  therapy, recommended SNF. She has finally agreed for [facility] rehab at discharge.   Objective: Vitals:   02/05/21 1813 02/05/21 2142 02/06/21 0000 02/06/21 0556  BP: (!) 165/81 (!) 192/87 (!) 161/85 (!) 162/84  Pulse: 65 70 71  73  Resp: 18 18 17 18   Temp: 98.3 F (36.8 C) 98.1 F (36.7 C) 98 F (36.7 C) 98.6 F (37 C)  TempSrc: Oral  Oral Oral  SpO2: 98% 97% 99% 97%  Weight:      Height:        Intake/Output Summary (Last 24 hours) at 02/06/2021 1101 Last data filed at 02/06/2021 3833 Gross per 24 hour  Intake 960 ml  Output 0 ml  Net 960 ml   Filed Weights   02/03/21 1715 02/03/21 2040  Weight: 54.4 kg 58.5 kg    Examination:  General exam: Appears comfortable, deconditioned, not in any acute distress. Respiratory system: Clear to auscultation bilaterally, respiratory effort normal, RR 15. Cardiovascular system: S1-S2 heard, regular rate and rhythm, no murmur. Gastrointestinal system: Abdomen is soft, nontender, nondistended, BS+ Central nervous system: Alert and oriented X 3.  Left-sided weakness+, poor left grip Extremities: No edema no cyanosis, no clubbing. Skin: No rashes, lesions or ulcers Psychiatry: Judgement and insight appear normal. Mood & affect appropriate.     Data Reviewed: I have personally reviewed following labs and imaging studies  CBC: Recent Labs  Lab 02/03/21 1645 02/04/21 0431  WBC 6.4 5.2  NEUTROABS 3.5  --   HGB 12.2   13.3 11.9*  HCT 36.1   39.0 36.6  MCV 96.0 94.8  PLT 219 383   Basic Metabolic Panel: Recent Labs  Lab 02/03/21 1645 02/04/21 0431  NA 137   138 142  K 3.6   4.4 3.5  CL 105   106 107  CO2 24 24  GLUCOSE 112*   110* 94  BUN 21   28* 16  CREATININE 1.19*   1.20* 0.91  CALCIUM 9.0 8.9   GFR: Estimated Creatinine Clearance: 36 mL/min (by C-G formula based on SCr of 0.91 mg/dL). Liver Function Tests: Recent Labs  Lab 02/03/21 1645  AST 27  ALT 16  ALKPHOS 67  BILITOT 1.1  PROT 6.9  ALBUMIN 3.7   No results for input(s): LIPASE, AMYLASE in the last 168 hours. No results for input(s): AMMONIA in the last 168 hours. Coagulation Profile: Recent Labs  Lab 02/03/21 1724  INR 1.1   Cardiac Enzymes: No results for input(s):  CKTOTAL, CKMB, CKMBINDEX, TROPONINI in the last 168 hours. BNP (last 3 results) No results for input(s): PROBNP in the last 8760 hours. HbA1C: Recent Labs    02/04/21 0431  HGBA1C 6.5*   CBG: Recent Labs  Lab 02/03/21 1638  GLUCAP 88   Lipid Profile: Recent Labs    02/04/21 0431  CHOL 150  HDL 72  LDLCALC 69  TRIG 45  CHOLHDL 2.1   Thyroid Function Tests: No results for input(s): TSH, T4TOTAL, FREET4, T3FREE, THYROIDAB in the last 72 hours. Anemia Panel: No results for input(s): VITAMINB12, FOLATE, FERRITIN, TIBC, IRON, RETICCTPCT in the last 72 hours. Sepsis Labs: No results for input(s): PROCALCITON, LATICACIDVEN in the last 168 hours.  Recent Results (from the past 240 hour(s))  Resp Panel by RT-PCR (Flu A&B, Covid) Nasopharyngeal Swab     Status: None   Collection Time: 02/03/21  5:26 PM   Specimen: Nasopharyngeal Swab; Nasopharyngeal(NP) swabs in vial transport medium  Result Value Ref Range Status  SARS Coronavirus 2 by RT PCR NEGATIVE NEGATIVE Final    Comment: (NOTE) SARS-CoV-2 target nucleic acids are NOT DETECTED.  The SARS-CoV-2 RNA is generally detectable in upper respiratory specimens during the acute phase of infection. The lowest concentration of SARS-CoV-2 viral copies this assay can detect is 138 copies/mL. A negative result does not preclude SARS-Cov-2 infection and should not be used as the sole basis for treatment or other patient management decisions. A negative result may occur with  improper specimen collection/handling, submission of specimen other than nasopharyngeal swab, presence of viral mutation(s) within the areas targeted by this assay, and inadequate number of viral copies(<138 copies/mL). A negative result must be combined with clinical observations, patient history, and epidemiological information. The expected result is Negative.  Fact Sheet for Patients:  EntrepreneurPulse.com.au  Fact Sheet for Healthcare  Providers:  IncredibleEmployment.be  This test is no t yet approved or cleared by the [place] FDA and  has been authorized for detection and/or diagnosis of SARS-CoV-2 by FDA under an Emergency Use Authorization (EUA). This EUA will remain  in effect (meaning this test can be used) for the duration of the COVID-19 declaration under Section 564(b)(1) of the Act, 21 U.S.C.section 360bbb-3(b)(1), unless the authorization is terminated  or revoked sooner.       Influenza A by PCR NEGATIVE NEGATIVE Final   Influenza B by PCR NEGATIVE NEGATIVE Final    Comment: (NOTE) The Xpert Xpress SARS-CoV-2/FLU/RSV plus assay is intended as an aid in the diagnosis of influenza from Nasopharyngeal swab specimens and should not be used as a sole basis for treatment. Nasal washings and aspirates are unacceptable for Xpert Xpress SARS-CoV-2/FLU/RSV testing.  Fact Sheet for Patients: EntrepreneurPulse.com.au  Fact Sheet for Healthcare Providers: IncredibleEmployment.be  This test is not yet approved or cleared by the [place] FDA and has been authorized for detection and/or diagnosis of SARS-CoV-2 by FDA under an Emergency Use Authorization (EUA). This EUA will remain in effect (meaning this test can be used) for the duration of the COVID-19 declaration under Section 564(b)(1) of the Act, 21 U.S.C. section 360bbb-3(b)(1), unless the authorization is terminated or revoked.  Performed at [facility], [address], [place], [place] [address]     Radiology Studies: CT HEAD WO CONTRAST (5MM)  Result Date: 02/04/2021 CLINICAL DATA:  Stroke follow-up EXAM: CT HEAD WITHOUT CONTRAST TECHNIQUE: Contiguous axial images were obtained from the base of the skull through the vertex without intravenous contrast. RADIATION DOSE REDUCTION: This exam was performed according to the departmental dose-optimization program which includes automated  exposure control, adjustment of the mA and/or kV according to patient size and/or use of iterative reconstruction technique. COMPARISON:  CT head 02/03/2021 FINDINGS: Brain: Mild atrophy. Moderate white matter changes with patchy hypodensity throughout the cerebral white matter bilaterally. Ill-defined hypodensity left thalamus unchanged. Negative for acute cortical infarct, hemorrhage, mass Vascular: Negative for hyperdense vessel Skull: Negative Sinuses/Orbits: Mild mucosal edema right maxillary sinus. Remaining sinuses clear. Right cataract extraction Other: None IMPRESSION: Atrophy and chronic microvascular ischemic change. No acute cortical infarct. Left thalamic hypodensity compatible with infarct of indeterminate age Negative for hemorrhage. Electronically Signed   By: Franchot Gallo M.D.   On: 02/04/2021 14:11   ECHOCARDIOGRAM COMPLETE  Result Date: 02/04/2021    ECHOCARDIOGRAM REPORT   Patient Name:   [name] Date of Exam: 02/04/2021 Medical Rec #:  [id number]        Height:       63.0 in Accession #:  [id number]       Weight:       129.0 lb Date of Birth:  [birth year]        BSA:          1.605 m Patient Age:    64 years         BP:           145/65 mmHg Patient Gender: F                HR:           56 bpm. Exam Location:  [facility] Procedure: 2D Echo, Cardiac Doppler and Color Doppler Indications:    TIA  History:        Patient has prior history of Echocardiogram examinations, most                 recent 11/20/2019. CAD, Defibrillator, TIA,                 Arrythmias:Bradycardia and Atrial Fibrillation,                 Signs/Symptoms:Chest Pain; Risk Factors:Hypertension, Diabetes                 and Dyslipidemia.  Sonographer:    Wenda Low Referring Phys: [place]  1. Left ventricular ejection fraction, by estimation, is 50 to 55%. The left ventricle has low normal function. The left ventricle has no regional wall motion abnormalities. There is mild  concentric left ventricular hypertrophy. Left ventricular diastolic parameters are consistent with Grade I diastolic dysfunction (impaired relaxation).  2. Right ventricular systolic function is normal. The right ventricular size is normal.  3. Left atrial size was severely dilated.  4. The mitral valve is degenerative. Trivial mitral valve regurgitation. No evidence of mitral stenosis.  5. The aortic valve is tricuspid. Aortic valve regurgitation is mild. No aortic stenosis is present.  6. Aortic dilatation noted. There is mild dilatation of the ascending aorta, measuring 41 mm. Comparison(s): No significant change from prior study. Conclusion(s)/Recommendation(s): No intracardiac source of embolism detected on this transthoracic study. Consider a transesophageal echocardiogram to exclude cardiac source of embolism if clinically indicated. FINDINGS  Left Ventricle: Left ventricular ejection fraction, by estimation, is 50 to 55%. The left ventricle has low normal function. The left ventricle has no regional wall motion abnormalities. The left ventricular internal cavity size was normal in size. There is mild concentric left ventricular hypertrophy. Left ventricular diastolic parameters are consistent with Grade I diastolic dysfunction (impaired relaxation). Right Ventricle: The right ventricular size is normal. No increase in right ventricular wall thickness. Right ventricular systolic function is normal. Left Atrium: Left atrial size was severely dilated. Right Atrium: Right atrial size was normal in size. Pericardium: There is no evidence of pericardial effusion. Mitral Valve: The mitral valve is degenerative in appearance. Mild to moderate mitral annular calcification. Trivial mitral valve regurgitation. No evidence of mitral valve stenosis. MV peak gradient, 2.1 mmHg. The mean mitral valve gradient is 1.0 mmHg. Tricuspid Valve: The tricuspid valve is grossly normal. Tricuspid valve regurgitation is mild . No  evidence of tricuspid stenosis. Aortic Valve: The aortic valve is tricuspid. Aortic valve regurgitation is mild. No aortic stenosis is present. Aortic valve mean gradient measures 3.0 mmHg. Aortic valve peak gradient measures 5.5 mmHg. Aortic valve area, by VTI measures 2.28 cm. Pulmonic Valve: The pulmonic valve was grossly normal. Pulmonic valve regurgitation is trivial. No evidence of pulmonic stenosis.  Aorta: Aortic dilatation noted. There is mild dilatation of the ascending aorta, measuring 41 mm. Venous: The inferior vena cava was not well visualized. IAS/Shunts: The atrial septum is grossly normal. Additional Comments: A device lead is visualized in the right atrium and right ventricle.  LEFT VENTRICLE PLAX 2D LVIDd:         4.90 cm     Diastology LVIDs:         3.40 cm     LV e' medial:    4.95 cm/s LV PW:         1.20 cm     LV E/e' medial:  10.6 LV IVS:        1.20 cm     LV e' lateral:   8.85 cm/s LVOT diam:     2.00 cm     LV E/e' lateral: 5.9 LV SV:         63 LV SV Index:   40 LVOT Area:     3.14 cm  LV Volumes (MOD) LV vol d, MOD A2C: 46.3 ml LV vol d, MOD A4C: 39.5 ml LV vol s, MOD A2C: 23.8 ml LV vol s, MOD A4C: 14.8 ml LV SV MOD A2C:     22.5 ml LV SV MOD A4C:     39.5 ml LV SV MOD BP:      24.6 ml RIGHT VENTRICLE RV Basal diam:  3.35 cm RV Mid diam:    2.50 cm RV S prime:     9.68 cm/s TAPSE (M-mode): 2.4 cm LEFT ATRIUM             Index        RIGHT ATRIUM           Index LA diam:        4.10 cm 2.55 cm/m   RA Area:     16.70 cm LA Vol (A2C):   97.5 ml 60.76 ml/m  RA Volume:   44.80 ml  27.92 ml/m LA Vol (A4C):   80.6 ml 50.23 ml/m LA Biplane Vol: 91.7 ml 57.14 ml/m  AORTIC VALVE                    PULMONIC VALVE AV Area (Vmax):    2.40 cm     PV Vmax:       0.77 m/s AV Area (Vmean):   2.33 cm     PV Peak grad:  2.4 mmHg AV Area (VTI):     2.28 cm AV Vmax:           117.00 cm/s AV Vmean:          76.200 cm/s AV VTI:            0.278 m AV Peak Grad:      5.5 mmHg AV Mean Grad:      3.0  mmHg LVOT Vmax:         89.40 cm/s LVOT Vmean:        56.500 cm/s LVOT VTI:          0.202 m LVOT/AV VTI ratio: 0.73  AORTA Ao Root diam: 3.30 cm Ao Asc diam:  4.10 cm MITRAL VALVE               TRICUSPID VALVE MV Area (PHT): 2.22 cm    TR Peak grad:   27.0 mmHg MV Area VTI:   2.39 cm    TR Vmax:        260.00 cm/s  MV Peak grad:  2.1 mmHg MV Mean grad:  1.0 mmHg    SHUNTS MV Vmax:       0.73 m/s    Systemic VTI:  0.20 m MV Vmean:      46.9 cm/s   Systemic Diam: 2.00 cm MV Decel Time: 341 msec MV E velocity: 52.30 cm/s MV A velocity: 74.10 cm/s MV E/A ratio:  0.71 Eleonore Chiquito MD Electronically signed by Eleonore Chiquito MD Signature Date/Time: 02/04/2021/12:12:36 PM    Final     Scheduled Meds:   stroke: mapping our early stages of recovery book   Does not apply Once   amiodarone  200 mg Oral Daily   apixaban  2.5 mg Oral BID   aspirin EC  81 mg Oral Daily   busPIRone  5 mg Oral BID   cholecalciferol  2,000 Units Oral q morning   feeding supplement  237 mL Oral BID BM   isosorbide mononitrate  60 mg Oral Daily   levothyroxine  100 mcg Oral Q0600   pantoprazole  40 mg Oral Daily   rosuvastatin  10 mg Oral Daily   Continuous Infusions:  sodium chloride 75 mL/hr at 02/05/21 2325     LOS: 2 days    Time spent: 35 mins    Lachele Lievanos, MD [facility]   If 7PM-7AM, please contact night-coverage

## 2021-02-07 ENCOUNTER — Ambulatory Visit (HOSPITAL_COMMUNITY): Payer: Medicare Other

## 2021-02-07 MED ORDER — HYDRALAZINE HCL 20 MG/ML IJ SOLN: 10.0000 mg | INTRAMUSCULAR | Status: DC | PRN

## 2021-02-07 MED ORDER — METOPROLOL SUCCINATE ER 25 MG PO TB24
12.5000 mg | ORAL_TABLET | Freq: Every day | ORAL | Status: DC
  Administered 2021-02-07 – 2021-02-08 (×2): 12.5 mg via ORAL
  Filled 2021-02-07 (×2): qty 1

## 2021-02-07 MED ORDER — FUROSEMIDE 20 MG PO TABS
20.0000 mg | ORAL_TABLET | ORAL | Status: DC
  Administered 2021-02-07: 20 mg via ORAL
  Filled 2021-02-07: qty 1

## 2021-02-07 NOTE — TOC Progression Note (Signed)
Transition of Care [facility]) - Progression Note    Patient Details  Name: [name] MRN: [id number] Date of Birth: [birth year]  Transition of Care [facility]) CM/SW [place], [place] Phone Number: 02/07/2021, 12:57 PM  Clinical Narrative:    CSW updated that pt wishes to go to SNF at this time. CSW spoke with pt in room who states that she is willing to go to [facility] as she has been there in the past. CSW sent pts referral and [facility] made a bed offer. CSW spoke with Ebony Hail at the facility who states pt will go to room 415-2 and the number for report is [phone]. Pt will likely D/C to facility tmrw. TOC to follow.   Expected Discharge Plan: [place] Barriers to Discharge: Continued Medical Work up  Expected Discharge Plan and Services Expected Discharge Plan: [place] In-house Referral: Clinical Social Work     Living arrangements for the past 2 months: Single Family Home                                       Social Determinants of Health (SDOH) Interventions    Readmission Risk Interventions Readmission Risk Prevention Plan 06/23/2020 03/28/2020 11/17/2019  Transportation Screening - Complete Complete  Medication Review Press photographer) Complete Complete Complete  PCP or Specialist appointment within 3-5 days of discharge Complete - Complete  HRI or [place] Complete Complete Complete  SW Recovery Care/Counseling Consult Complete Complete Complete  Palliative Care Screening Not Applicable Not Applicable Not [place] Patient Refused Not Applicable Not Applicable  Some recent data might be hidden

## 2021-02-07 NOTE — Progress Notes (Signed)
Patient admitted for TIA, She is up in chair at this time. Patient was requesting to go home but has now decided she will go to [facility] due to a lack of care available to her at home. Social work aware.

## 2021-02-07 NOTE — Progress Notes (Signed)
Physical Therapy Treatment Patient Details Name: [name] MRN: [id number] DOB: December 07, 1931 Today's Date: 02/07/2021   History of Present Illness [name]  is a 88 y.o. female, with medical history significant for atrial fibrillation and DVT, on Eliquis, coronary artery disease with AICD, hypertension, hyperlipidemia, hypothyroidism.  -Patient presents to ED secondary to weakness, presents from home, she was at urologist office because of history of kidney stones, was back into the house around 11:30, she was able to walk back into the house, 2 hours after she walked into the house around 1:30, she tried to get out of bed and walk, she could not stand up, and she felt her left leg is weak, and not feel right, the as well reports left-sided tingling and numbness yesterday when she woke up from sleep, she denies any loss of consciousness, vision changes, altered mental status, speech problems, she was noted to have left-sided weakness by EMS.  -ED code stroke was called initially, but after discussing with neurology on-call at [facility] it was canceled given patient hypertensive, on Eliquis, and no significant focal deficits present while in ED, CT head with no acute findings, CTA head and neck with a small vessel disease, MRI could not be obtained due to ICD, [facility] consulted to admit.    PT Comments    Pt sitting up in chair requesting to ambulate to toilet to void and then return to bed as her back is uncomfortable.  Pt with minguard assist with transfers and gait but modified independent with return to supine.  Pt able to complete self hygiene with assistance of therapist retrieving toilet paper only.  Completed hand hygiene independently as well.  Pt without any buckling or pain with ambulation.  Therex completed seated edge of bed prior to return to supine.  Pt will continue to benefit from continued skilled therapy to improve strength and return to prior function.    Recommendations for follow up therapy are one component of a multi-disciplinary discharge planning process, led by the attending physician.  Recommendations may be updated based on patient status, additional functional criteria and insurance authorization.  Follow Up Recommendations  Skilled nursing-short term rehab (<3 hours/day)                 Precautions / Restrictions Precautions Precautions: Fall     Mobility  Bed Mobility Overal bed mobility: Modified Independent Bed Mobility: Sit to Supine       Sit to supine: Modified independent (Device/Increase time)        Transfers Overall transfer level: Needs assistance Equipment used: Rolling walker (2 wheels) Transfers: Sit to/from Stand (toilet) Sit to Stand: Min guard Stand pivot transfers: Min guard              Ambulation/Gait Ambulation/Gait assistance: Min guard Gait Distance (Feet): 20 Feet Assistive device: Rolling walker (2 wheels) Gait Pattern/deviations: Step-to pattern, Decreased stride length, Trunk flexed, Narrow base of support                Cognition Arousal/Alertness: Awake/alert Behavior During Therapy: WFL for tasks assessed/performed Overall Cognitive Status: Within Functional Limits for tasks assessed                                 General Comments: pt sitting in chair asking if she could use the bathroom and walk back to her bed        Exercises General Exercises -  Lower Extremity Long Arc Quad: AROM, Both, 10 reps Hip Flexion/Marching: AROM, Both, 10 reps    General Comments        Pertinent Vitals/Pain Pain Assessment Pain Assessment: No/denies pain     PT Plan Current plan remains appropriate       AM-PAC PT "6 Clicks" Mobility   Outcome Measure  Help needed turning from your back to your side while in a flat bed without using bedrails?: None Help needed moving from lying on your back to sitting on the side of a flat bed without using  bedrails?: A Little Help needed moving to and from a bed to a chair (including a wheelchair)?: A Lot Help needed standing up from a chair using your arms (e.g., wheelchair or bedside chair)?: A Lot Help needed to walk in hospital room?: A Lot Help needed climbing 3-5 steps with a railing? : A Lot 6 Click Score: 15    End of Session Equipment Utilized During Treatment: Gait belt Activity Tolerance: Patient tolerated treatment well Patient left: in bed   PT Visit Diagnosis: Unsteadiness on feet (R26.81);Other abnormalities of gait and mobility (R26.89)     Time: 1645-1710 PT Time Calculation (min) (ACUTE ONLY): 25 min  Charges:  $Gait Training: 8-22 mins $Therapeutic Activity: 8-22 mins                    Teena Irani, PTA/CLT, WTA [phone]    Mare Ferrari, Kamal Jurgens B 02/07/2021, 5:21 PM

## 2021-02-07 NOTE — Progress Notes (Signed)
PROGRESS NOTE    [name]  KXF:[id number] DOB: 1931-09-06 DOA: 02/03/2021  PCP: [name], DO   Brief Narrative:  This 88 years old female with PMH significant for atrial fibrillation and DVT on Eliquis, coronary artery disease with AICD, hypertension, hyperlipidemia, hypothyroidism presented in the ED with complaints of left-sided weakness.  Patient reports she was at urologist office because of kidney stones in the morning and she came back home,  She was able to walk at home.  She went to the bed and 2 hours after she woke up and was not able to get out of bed and walk, she could not stand and felt her left leg is weak.  She also reported left-sided tingling and numbness after she woke up from sleep.  She denies any loss of consciousness, visual changes, altered mentation, speech problems.  She was also noted to have left-sided weakness by EMS.  Patient was assessed by tele neurology, deemed not a candidate for tPA given being on Eliquis,, hypertensive, with no significant focal deficits.  CT head no acute findings, CTA head and neck showed small vessel disease.  MRI could not be obtained due to AICD.   Assessment & Plan:   Principal Problem:   TIA (transient ischemic attack) Active Problems:   CKD (chronic kidney disease), stage III (HCC)   ICD (implantable cardioverter-defibrillator) battery depletion   Diabetes mellitus type 2, diet-controlled ([place])   Essential hypertension   GERD (gastroesophageal reflux disease)   Atrial fibrillation, chronic (HCC)   CAD (coronary artery disease)   Hypothyroidism  Acute CVA : Patient presented with left-sided weakness, tingling and numbness. She was also noted to have left-sided facial droop on physical exam and left tongue deviation. CT head without any acute findings.  CTA head and neck showed small vessel disease. Tele- neurologist was consulted by ED physician. Patient does not deemed candidate for tPA given she is on  Eliquis, hypertensive and has no focal signs of acute CVA on CT scan, CTA does not support large vessel occlusion. Patient was admitted for CVA work-up. MRI could not be obtained due to patient has noncompatible AICD. LDL 69 under goal, hemoglobin A1c 6.5, Echo shows LVEF 50 to 55%. Repeat CT head without acute stroke. Continue with Eliquis, aspirin and Crestor. PT and OT recommended  skilled nursing facility for rehab. Allow permissive hypertension.  Normalize BP in 2 to 3 days. Follow-up neurology Dr. Merlene Laughter in 4 weeks as an outpatient.  CKD stage IIIa: Creatinine at baseline.   Avoid nephrotoxic medications.    Chronic atrial fibrillation: Continue Eliquis, Continue with amiodarone for heart rate control.   Toprol-XL resumed.  History of DVT: Continue Eliquis.  CAD: She denies any chest pain or shortness of breath. Continue with statin, metoprolol resumed.  GERD: Continue pantoprazole.  Hyperlipidemia: Continue Crestor.  Hypothyroidism: Continue levothyroxine  DVT prophylaxis: Eliquis Code Status: DNR Family Communication: No family at bedside Disposition Plan:     Status is: Inpatient  Remains inpatient appropriate because:  Admitted for TIA/CVA work-up.  MRI could not be obtained due to AICD.  Neurology consulted, stroke work-up completed.  PT recommended SNF.  Patient is agreeable for [facility].  Insurance authorization started.    Consultants:  Neurology  Procedures: CT head, CT head and neck Antimicrobials: None  Subjective: Patient was seen and examined at bedside.  Overnight events noted.   She still appears to have left-sided weakness. Patient has participated in physical therapy, recommended SNF. She has  finally agreed for [facility] rehab at discharge.   Objective: Vitals:   02/06/21 0556 02/06/21 1300 02/06/21 2025 02/07/21 0431  BP: (!) 162/84 (!) 177/82 (!) 188/78 (!) 181/82  Pulse: 73 63 61 60  Resp: 18 20 19 19   Temp: 98.6 F  (37 C) 97.7 F (36.5 C) 98.7 F (37.1 C) 97.8 F (36.6 C)  TempSrc: Oral Oral Oral Oral  SpO2: 97% 100% 98% 96%  Weight:      Height:        Intake/Output Summary (Last 24 hours) at 02/07/2021 1213 Last data filed at 02/06/2021 1300 Gross per 24 hour  Intake 240 ml  Output --  Net 240 ml   Filed Weights   02/03/21 1715 02/03/21 2040  Weight: 54.4 kg 58.5 kg    Examination:  General exam: Appears comfortable, deconditioned, not in any distress. Respiratory system: Clear to auscultation bilaterally, respiratory effort normal, RR 14. Cardiovascular system: S1-S2 heard, regular rate and rhythm, no murmur. Gastrointestinal system: Abdomen is soft, nontender, nondistended, BS+ Central nervous system: Alert and oriented X 3.  Left-sided weakness+, poor left grip Extremities: No edema no cyanosis, no clubbing. Skin: No rashes, lesions or ulcers Psychiatry: Judgement and insight appear normal. Mood & affect appropriate.     Data Reviewed: I have personally reviewed following labs and imaging studies  CBC: Recent Labs  Lab 02/03/21 1645 02/04/21 0431  WBC 6.4 5.2  NEUTROABS 3.5  --   HGB 12.2   13.3 11.9*  HCT 36.1   39.0 36.6  MCV 96.0 94.8  PLT 219 720   Basic Metabolic Panel: Recent Labs  Lab 02/03/21 1645 02/04/21 0431  NA 137   138 142  K 3.6   4.4 3.5  CL 105   106 107  CO2 24 24  GLUCOSE 112*   110* 94  BUN 21   28* 16  CREATININE 1.19*   1.20* 0.91  CALCIUM 9.0 8.9   GFR: Estimated Creatinine Clearance: 36 mL/min (by C-G formula based on SCr of 0.91 mg/dL). Liver Function Tests: Recent Labs  Lab 02/03/21 1645  AST 27  ALT 16  ALKPHOS 67  BILITOT 1.1  PROT 6.9  ALBUMIN 3.7   No results for input(s): LIPASE, AMYLASE in the last 168 hours. No results for input(s): AMMONIA in the last 168 hours. Coagulation Profile: Recent Labs  Lab 02/03/21 1724  INR 1.1   Cardiac Enzymes: No results for input(s): CKTOTAL, CKMB, CKMBINDEX, TROPONINI in  the last 168 hours. BNP (last 3 results) No results for input(s): PROBNP in the last 8760 hours. HbA1C: No results for input(s): HGBA1C in the last 72 hours.  CBG: Recent Labs  Lab 02/03/21 1638  GLUCAP 88   Lipid Profile: No results for input(s): CHOL, HDL, LDLCALC, TRIG, CHOLHDL, LDLDIRECT in the last 72 hours.  Thyroid Function Tests: No results for input(s): TSH, T4TOTAL, FREET4, T3FREE, THYROIDAB in the last 72 hours. Anemia Panel: No results for input(s): VITAMINB12, FOLATE, FERRITIN, TIBC, IRON, RETICCTPCT in the last 72 hours. Sepsis Labs: No results for input(s): PROCALCITON, LATICACIDVEN in the last 168 hours.  Recent Results (from the past 240 hour(s))  Resp Panel by RT-PCR (Flu A&B, Covid) Nasopharyngeal Swab     Status: None   Collection Time: 02/03/21  5:26 PM   Specimen: Nasopharyngeal Swab; Nasopharyngeal(NP) swabs in vial transport medium  Result Value Ref Range Status   SARS Coronavirus 2 by RT PCR NEGATIVE NEGATIVE Final    Comment: (NOTE) SARS-CoV-2  target nucleic acids are NOT DETECTED.  The SARS-CoV-2 RNA is generally detectable in upper respiratory specimens during the acute phase of infection. The lowest concentration of SARS-CoV-2 viral copies this assay can detect is 138 copies/mL. A negative result does not preclude SARS-Cov-2 infection and should not be used as the sole basis for treatment or other patient management decisions. A negative result may occur with  improper specimen collection/handling, submission of specimen other than nasopharyngeal swab, presence of viral mutation(s) within the areas targeted by this assay, and inadequate number of viral copies(<138 copies/mL). A negative result must be combined with clinical observations, patient history, and epidemiological information. The expected result is Negative.  Fact Sheet for Patients:  EntrepreneurPulse.com.au  Fact Sheet for Healthcare Providers:   IncredibleEmployment.be  This test is no t yet approved or cleared by the [place] FDA and  has been authorized for detection and/or diagnosis of SARS-CoV-2 by FDA under an Emergency Use Authorization (EUA). This EUA will remain  in effect (meaning this test can be used) for the duration of the COVID-19 declaration under Section 564(b)(1) of the Act, 21 U.S.C.section 360bbb-3(b)(1), unless the authorization is terminated  or revoked sooner.       Influenza A by PCR NEGATIVE NEGATIVE Final   Influenza B by PCR NEGATIVE NEGATIVE Final    Comment: (NOTE) The Xpert Xpress SARS-CoV-2/FLU/RSV plus assay is intended as an aid in the diagnosis of influenza from Nasopharyngeal swab specimens and should not be used as a sole basis for treatment. Nasal washings and aspirates are unacceptable for Xpert Xpress SARS-CoV-2/FLU/RSV testing.  Fact Sheet for Patients: EntrepreneurPulse.com.au  Fact Sheet for Healthcare Providers: IncredibleEmployment.be  This test is not yet approved or cleared by the [place] FDA and has been authorized for detection and/or diagnosis of SARS-CoV-2 by FDA under an Emergency Use Authorization (EUA). This EUA will remain in effect (meaning this test can be used) for the duration of the COVID-19 declaration under Section 564(b)(1) of the Act, 21 U.S.C. section 360bbb-3(b)(1), unless the authorization is terminated or revoked.  Performed at [facility], [address] [place], [place] [address]     Radiology Studies: No results found.  Scheduled Meds:   stroke: mapping our early stages of recovery book   Does not apply Once   amiodarone  200 mg Oral Daily   apixaban  2.5 mg Oral BID   aspirin EC  81 mg Oral Daily   busPIRone  5 mg Oral BID   cholecalciferol  2,000 Units Oral q morning   feeding supplement  237 mL Oral BID BM   gabapentin  100 mg Oral QHS   isosorbide mononitrate  60 mg Oral  Daily   levothyroxine  100 mcg Oral Q0600   pantoprazole  40 mg Oral Daily   rosuvastatin  10 mg Oral Daily   Continuous Infusions:  sodium chloride 75 mL/hr at 02/05/21 2325     LOS: 3 days    Time spent: 35 mins    Sedalia Greeson, MD [facility]   If 7PM-7AM, please contact night-coverage

## 2021-02-07 NOTE — Progress Notes (Signed)
Pt states that she no longer wants to go to the [facility], that she wants to go home and that she has been speaking with her children. Will pass information on in shift report to on coming nurse to help pt facilitate wants. Pt lying in bed. Pt is alert and able to make needs known/. Pt denies any pain or discomfort at this time

## 2021-02-07 NOTE — NC FL2 (Signed)
[place] LEVEL OF CARE SCREENING TOOL     IDENTIFICATION  Patient Name: [name] Birthdate: 02-25-1931 Sex: female Admission Date (Current Location): 02/03/2021  [facility] and [place] Number:  [employer] and Address:  [place] 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[place]) 05/12/2020   Intractable abdominal pain 03/28/2020   Lactic acidosis 03/28/2020   Elevated troponin I level 03/28/2020   Malnutrition of moderate degree 12/26/2019   Prolonged QT interval 12/23/2019   Acute lower UTI 12/23/2019   Constipation 11/27/2019   Diarrhea 11/27/2019   Lesion of pancreas 11/26/2019   Fibromuscular dysplasia of right renal artery ([place]) 11/26/2019   Left renal artery stenosis ([place]) 11/26/2019   RUQ pain    Abnormal CT of the abdomen    Vertigo 11/15/2019   Advanced care planning/counseling discussion 11/15/2019   CAD (coronary artery disease)    Hypothyroidism    Hyponatremia    Volume depletion    TSH elevation 08/01/2019   Hospital discharge follow-up 08/01/2019   Exudative age-related macular degeneration of right eye with inactive choroidal neovascularization ([place]) 04/30/2019   Retinal hemorrhage of right eye 04/30/2019   Advanced nonexudative age-related macular degeneration of left eye with subfoveal involvement 04/30/2019   Exudative age-related macular  degeneration of left eye with inactive choroidal neovascularization ([place]) 04/30/2019   Advanced nonexudative age-related macular degeneration of right eye with subfoveal involvement 04/30/2019   Posterior vitreous detachment of both eyes 04/30/2019   Vitreous degeneration, bilateral 04/30/2019   Secondary hypercoagulable state ([place]) 03/11/2019   Persistent atrial fibrillation ([place])    Proctitis 01/09/2019   Atrial fibrillation, chronic ([place]) 01/09/2019   Hyperlipidemia 01/09/2019   Nausea 01/09/2019   Hyperglycemia 01/09/2019   COVID-19 virus infection 12/28/2018   Chronic diarrhea 09/18/2018   Abdominal pain 09/18/2018   Osteoarthritis of right knee 03/29/2018   GERD (gastroesophageal reflux disease)    Dehydration 02/06/2018   Hypokalemia 02/06/2018   Diarrhea of presumed infectious origin 02/06/2018   Acute kidney injury superimposed on CKD ([place]) 02/05/2018   Essential hypertension 12/21/2017   Degenerative scoliosis 02/19/2017   Burst fracture of lumbar vertebra ([place]) 02/19/2017   History of drug-induced prolonged QT interval with torsade de pointes 11/01/2016   Iron deficiency anemia 10/30/2016   Long term current use of anticoagulant 02/17/2016   Near syncope 05/22/2015   Diabetes mellitus type 2, diet-controlled ([place]) 09/14/2014   ICD (implantable cardioverter-defibrillator) battery depletion 01/20/2014   Abnormal nuclear stress test, 12/30/13 12/31/2013   Coronary artery disease involving native coronary artery of native heart without angina pectoris 12/31/2013   Paroxysmal atrial fibrillation, chads2 Vasc2 score of 5, on eliquis 10/16/2013   Catecholaminergic polymorphic ventricular tachycardia 10/16/2013   Ureteral stone with hydronephrosis 29/93/7169   Metabolic syndrome 67/89/3810   Orthostatic hypotension 07/27/2011   Chest pain 07/25/2011   Weakness 07/25/2011  Chronic sinus bradycardia 07/25/2011   ICD (implantable cardioverter-defibrillator) in place 07/25/2011    CKD (chronic kidney disease), stage III ([place]) 07/25/2011   History of DVT in the past, on eliquis now 07/25/2011   Nephrolithiasis, just saw Dr [name] 07/25/2011   DYSPHAGIA 03/24/2010   Chronic ischemic heart disease 12/03/2007   Irritable bowel syndrome with diarrhea 12/03/2007   EPIGASTRIC PAIN 12/03/2007   Hyperlipidemia associated with type 2 diabetes mellitus ([place]) 12/02/2007   EXTERNAL HEMORRHOIDS 12/02/2007   ESOPHAGITIS 12/02/2007   GASTRITIS 12/02/2007   DIVERTICULOSIS, COLON 12/02/2007   ARTHRITIS 12/02/2007    Orientation RESPIRATION BLADDER Height & Weight     Situation, Time, Self, Place  Normal Continent Weight: 128 lb 15.5 oz (58.5 kg) Height:  5\' 3"  (160 cm)  BEHAVIORAL SYMPTOMS/MOOD NEUROLOGICAL BOWEL NUTRITION STATUS      Continent Diet (See D/C summary)  AMBULATORY STATUS COMMUNICATION OF NEEDS Skin   Extensive Assist Verbally Normal                       Personal Care Assistance Level of Assistance  Bathing, Feeding, Dressing, Total care Bathing Assistance: Limited assistance Feeding assistance: Independent Dressing Assistance: Limited assistance Total Care Assistance: Limited assistance   Functional Limitations Info  Sight, Hearing, Speech Sight Info: Adequate Hearing Info: Adequate Speech Info: Adequate    SPECIAL CARE FACTORS FREQUENCY  PT (By licensed PT), OT (By licensed OT)     PT Frequency: 5 times weekly OT Frequency: 5 times weekly            Contractures Contractures Info: Not present    Additional Factors Info  Code Status, Allergies Code Status Info: DNR Allergies Info: Sotalol, Tramadol, Actos (Pioglitazone), Adhesive (Tape), Atorvastatin, Bactrim (Sulfamethoxazole-trimethoprim), Ciprofloxacin, Contrast Media (Iodinated Contrast Media), Latex, Pedi-pre Tape Spray (Wound Dressing Adhesive), Sulfa Antibiotics           Current Medications (02/07/2021):  This is the current hospital active medication list Current  Facility-Administered Medications  Medication Dose Route Frequency Provider Last Rate Last Admin    stroke: mapping our early stages of recovery book   Does not apply Once Elgergawy, Silver Huguenin, MD       0.9 %  sodium chloride infusion   Intravenous Continuous Elgergawy, Silver Huguenin, MD 75 mL/hr at 02/05/21 2325 New Bag at 02/05/21 2325   acetaminophen (TYLENOL) tablet 650 mg  650 mg Oral Q4H PRN Elgergawy, Silver Huguenin, MD   650 mg at 02/06/21 2211   Or   acetaminophen (TYLENOL) 160 MG/5ML solution 650 mg  650 mg Per Tube Q4H PRN Elgergawy, Silver Huguenin, MD       Or   acetaminophen (TYLENOL) suppository 650 mg  650 mg Rectal Q4H PRN Elgergawy, Silver Huguenin, MD       amiodarone (PACERONE) tablet 200 mg  200 mg Oral Daily Elgergawy, Silver Huguenin, MD   200 mg at 02/07/21 4650   apixaban (ELIQUIS) tablet 2.5 mg  2.5 mg Oral BID Elgergawy, Silver Huguenin, MD   2.5 mg at 02/07/21 3546   aspirin EC tablet 81 mg  81 mg Oral Daily Rosalin Hawking, MD   81 mg at 02/07/21 0941   busPIRone (BUSPAR) tablet 5 mg  5 mg Oral BID Elgergawy, Silver Huguenin, MD   5 mg at 02/07/21 0941   cholecalciferol (VITAMIN D3) tablet 2,000 Units  2,000 Units Oral q morning Elgergawy, Silver Huguenin, MD   2,000 Units at 02/07/21 0942   feeding supplement (  ENSURE ENLIVE / ENSURE PLUS) liquid 237 mL  237 mL Oral BID BM Elgergawy, Silver Huguenin, MD   237 mL at 02/07/21 0942   gabapentin (NEURONTIN) capsule 100 mg  100 mg Oral QHS Zierle-Ghosh, Asia B, DO   100 mg at 02/07/21 0215   hydrALAZINE (APRESOLINE) injection 10 mg  10 mg Intravenous Q4H PRN Shawna Clamp, MD       isosorbide mononitrate (IMDUR) 24 hr tablet 60 mg  60 mg Oral Daily Rosalin Hawking, MD   60 mg at 02/07/21 0941   levothyroxine (SYNTHROID) tablet 100 mcg  100 mcg Oral Q0600 Elgergawy, Silver Huguenin, MD   100 mcg at 02/07/21 0549   melatonin tablet 6 mg  6 mg Oral QHS PRN Zierle-Ghosh, Asia B, DO   6 mg at 02/06/21 2211   pantoprazole (PROTONIX) EC tablet 40 mg  40 mg Oral Daily Elgergawy, Silver Huguenin, MD   40 mg at  02/07/21 0941   rosuvastatin (CRESTOR) tablet 10 mg  10 mg Oral Daily Elgergawy, Silver Huguenin, MD   10 mg at 02/07/21 0942   senna-docusate (Senokot-S) tablet 1 tablet  1 tablet Oral QHS PRN Elgergawy, Silver Huguenin, MD         Discharge Medications: Please see discharge summary for a list of discharge medications.  Relevant Imaging Results:  Relevant Lab Results:   Additional Information Pt SSN: [id number]  Iona Beard, [place]

## 2021-02-08 DIAGNOSIS — F32A Depression, unspecified: Secondary | ICD-10-CM | POA: Diagnosis not present

## 2021-02-08 DIAGNOSIS — K580 Irritable bowel syndrome with diarrhea: Secondary | ICD-10-CM | POA: Diagnosis not present

## 2021-02-08 DIAGNOSIS — E039 Hypothyroidism, unspecified: Secondary | ICD-10-CM | POA: Diagnosis not present

## 2021-02-08 DIAGNOSIS — E119 Type 2 diabetes mellitus without complications: Secondary | ICD-10-CM | POA: Diagnosis not present

## 2021-02-08 DIAGNOSIS — E785 Hyperlipidemia, unspecified: Secondary | ICD-10-CM | POA: Diagnosis not present

## 2021-02-08 DIAGNOSIS — I10 Essential (primary) hypertension: Secondary | ICD-10-CM | POA: Diagnosis not present

## 2021-02-08 DIAGNOSIS — I6781 Acute cerebrovascular insufficiency: Secondary | ICD-10-CM | POA: Diagnosis not present

## 2021-02-08 DIAGNOSIS — R262 Difficulty in walking, not elsewhere classified: Secondary | ICD-10-CM | POA: Diagnosis not present

## 2021-02-08 DIAGNOSIS — R5381 Other malaise: Secondary | ICD-10-CM | POA: Diagnosis not present

## 2021-02-08 DIAGNOSIS — R0981 Nasal congestion: Secondary | ICD-10-CM | POA: Diagnosis not present

## 2021-02-08 DIAGNOSIS — I482 Chronic atrial fibrillation, unspecified: Secondary | ICD-10-CM | POA: Diagnosis not present

## 2021-02-08 DIAGNOSIS — G8929 Other chronic pain: Secondary | ICD-10-CM | POA: Diagnosis not present

## 2021-02-08 DIAGNOSIS — M159 Polyosteoarthritis, unspecified: Secondary | ICD-10-CM | POA: Diagnosis not present

## 2021-02-08 DIAGNOSIS — Z7901 Long term (current) use of anticoagulants: Secondary | ICD-10-CM | POA: Diagnosis not present

## 2021-02-08 DIAGNOSIS — I2510 Atherosclerotic heart disease of native coronary artery without angina pectoris: Secondary | ICD-10-CM | POA: Diagnosis not present

## 2021-02-08 DIAGNOSIS — I639 Cerebral infarction, unspecified: Secondary | ICD-10-CM | POA: Diagnosis not present

## 2021-02-08 DIAGNOSIS — G459 Transient cerebral ischemic attack, unspecified: Secondary | ICD-10-CM | POA: Diagnosis not present

## 2021-02-08 DIAGNOSIS — D649 Anemia, unspecified: Secondary | ICD-10-CM | POA: Diagnosis not present

## 2021-02-08 DIAGNOSIS — K219 Gastro-esophageal reflux disease without esophagitis: Secondary | ICD-10-CM | POA: Diagnosis not present

## 2021-02-08 DIAGNOSIS — Z95810 Presence of automatic (implantable) cardiac defibrillator: Secondary | ICD-10-CM | POA: Diagnosis not present

## 2021-02-08 DIAGNOSIS — R1314 Dysphagia, pharyngoesophageal phase: Secondary | ICD-10-CM | POA: Diagnosis not present

## 2021-02-08 DIAGNOSIS — G4700 Insomnia, unspecified: Secondary | ICD-10-CM | POA: Diagnosis not present

## 2021-02-08 DIAGNOSIS — R279 Unspecified lack of coordination: Secondary | ICD-10-CM | POA: Diagnosis not present

## 2021-02-08 DIAGNOSIS — R059 Cough, unspecified: Secondary | ICD-10-CM | POA: Diagnosis not present

## 2021-02-08 DIAGNOSIS — M6281 Muscle weakness (generalized): Secondary | ICD-10-CM | POA: Diagnosis not present

## 2021-02-08 DIAGNOSIS — N183 Chronic kidney disease, stage 3 unspecified: Secondary | ICD-10-CM | POA: Diagnosis not present

## 2021-02-08 LAB — BASIC METABOLIC PANEL
Anion gap: 6 (ref 5–15)
BUN: 13 mg/dL (ref 8–23)
CO2: 25 mmol/L (ref 22–32)
Calcium: 8.5 mg/dL — ABNORMAL LOW (ref 8.9–10.3)
Chloride: 109 mmol/L (ref 98–111)
Creatinine, Ser: 0.96 mg/dL (ref 0.44–1.00)
GFR, Estimated: 57 mL/min — ABNORMAL LOW (ref >60)
Glucose, Bld: 88 mg/dL (ref 70–99)
Potassium: 2.9 mmol/L — ABNORMAL LOW (ref 3.5–5.1)
Sodium: 140 mmol/L (ref 135–145)

## 2021-02-08 LAB — CBC
HCT: 32.4 % — ABNORMAL LOW (ref 36.0–46.0)
Hemoglobin: 10.8 g/dL — ABNORMAL LOW (ref 12.0–15.0)
MCH: 32.2 pg (ref 26.0–34.0)
MCHC: 33.3 g/dL (ref 30.0–36.0)
MCV: 96.7 fL (ref 80.0–100.0)
Platelets: 195 10*3/uL (ref 150–400)
RBC: 3.35 MIL/uL — ABNORMAL LOW (ref 3.87–5.11)
RDW: 14.9 % (ref 11.5–15.5)
WBC: 4.6 10*3/uL (ref 4.0–10.5)
nRBC: 0 % (ref 0.0–0.2)

## 2021-02-08 LAB — PHOSPHORUS: Phosphorus: 3.4 mg/dL (ref 2.5–4.6)

## 2021-02-08 LAB — MAGNESIUM: Magnesium: 1.7 mg/dL (ref 1.7–2.4)

## 2021-02-08 MED ORDER — POTASSIUM CHLORIDE 20 MEQ PO PACK
40.0000 meq | PACK | Freq: Once | ORAL | Status: AC
  Administered 2021-02-08: 10:00:00 40 meq via ORAL
  Filled 2021-02-08: qty 2

## 2021-02-08 MED ORDER — POTASSIUM CHLORIDE 10 MEQ/100ML IV SOLN
10.0000 meq | INTRAVENOUS | Status: AC
  Administered 2021-02-08 (×2): 10 meq via INTRAVENOUS
  Filled 2021-02-08: qty 100

## 2021-02-08 MED ORDER — ASPIRIN 81 MG PO TBEC: 81.0000 mg | DELAYED_RELEASE_TABLET | Freq: Every day | ORAL | 11 refills | Status: DC

## 2021-02-08 MED ORDER — POTASSIUM CHLORIDE 20 MEQ PO PACK
40.0000 meq | PACK | Freq: Once | ORAL | Status: AC
Start: 2021-02-08 — End: 2021-02-08
  Administered 2021-02-08: 09:00:00 40 meq via ORAL
  Filled 2021-02-08: qty 2

## 2021-02-08 NOTE — Discharge Summary (Signed)
Physician Discharge Summary  [name]:[id number] DOB: 1931-09-29 DOA: 02/03/2021  PCP: [name], DO  Admit date: 02/03/2021  Discharge date: 02/08/2021  Admitted From: Home.  Disposition:  SNF  Recommendations for Outpatient Follow-up:  Follow up with PCP in 1-2 weeks. Please obtain BMP/CBC in one week. Advised to follow-up with Neurology in 4 weeks. Advised to take aspirin and Eliquis for anticoagulation.  Home Health: None Equipment/Devices: None  Discharge Condition: Stable CODE STATUS: DNR Diet recommendation: Heart Healthy   Brief Summary / Hospital Course: This 88 years old female with PMH significant for atrial fibrillation and DVT on Eliquis, Coronary artery disease with AICD, hypertension, hyperlipidemia, hypothyroidism presented in the ED with complaints of left-sided weakness.  Patient reports she was at urologist office because of kidney stones in the morning and she came back home,  She was able to walk at home.  She went to the bed and 2 hours after she woke up and was not able to get out of bed and walk, she could not stand and felt her left leg is weak.  She also reported left-sided tingling and numbness after she woke up from sleep.  She denies any loss of consciousness, visual changes, altered mentation, speech problems.  She was also noted to have left-sided weakness by EMS.  Patient was assessed by tele neurology, deemed not a candidate for tPA given being on Eliquis,, hypertensive, with no significant focal deficits.  CT head no acute findings, CTA head and neck showed small vessel disease.  MRI could not be obtained due to AICD. Patient was admitted for possible stroke work-up.  Neurology was consulted.  MRI could not be obtained due to AICD.  Patient had repeat CT head in 48 hours which was negative for stroke. Patient continued to have left-sided weakness.  Neurologist suspected Patient having a stroke, recommended aspirin and Eliquis for  anticoagulation.  Recommended statins.  Patient feels better,  participated in physical therapy,  recommended SNF.  Patient is being discharged to SNF for rehabilitation.  She will follow-up with neurology in 4 weeks.  Appointment has been made.  She was managed for below problems.   Discharge Diagnoses:  Principal Problem:   TIA (transient ischemic attack) Active Problems:   CKD (chronic kidney disease), stage III (HCC)   ICD (implantable cardioverter-defibrillator) battery depletion   Diabetes mellitus type 2, diet-controlled ([place])   Essential hypertension   GERD (gastroesophageal reflux disease)   Atrial fibrillation, chronic (HCC)   CAD (coronary artery disease)   Hypothyroidism  Acute CVA : Patient presented with left-sided weakness, tingling and numbness. She was also noted to have left-sided facial droop on physical exam and left tongue deviation. CT head without any acute findings.  CTA head and neck showed small vessel disease. Tele- neurologist was consulted by ED physician. Patient does not deemed candidate for tPA given she is on Eliquis, hypertensive and has no focal signs of acute CVA on CT scan, CTA does not support large vessel occlusion. Patient was admitted for CVA work-up. MRI could not be obtained due to patient has noncompatible AICD. LDL 69 under goal, hemoglobin A1c 6.5, Echo shows LVEF 50 to 55%. Repeat CT head without acute stroke. Continue with Eliquis, aspirin and Crestor. PT and OT recommended  skilled nursing facility for rehab. Allow permissive hypertension.  Normalize BP in 2 to 3 days. Follow-up neurology Dr. Merlene Laughter in 4 weeks as an outpatient.   CKD stage IIIa: Creatinine at baseline.   Avoid  nephrotoxic medications.     Chronic atrial fibrillation: Continue Eliquis, Continue with amiodarone for heart rate control.   Toprol-XL resumed.   History of DVT: Continue Eliquis.   CAD: She denies any chest pain or shortness of breath. Continue  with statin, metoprolol resumed.   GERD: Continue pantoprazole.   Hyperlipidemia: Continue Crestor.   Hypothyroidism: Continue levothyroxine    Discharge Instructions  Discharge Instructions     Ambulatory referral to Neurology   Complete by: As directed    Call MD for:  difficulty breathing, headache or visual disturbances   Complete by: As directed    Call MD for:  persistant dizziness or light-headedness   Complete by: As directed    Call MD for:  persistant nausea and vomiting   Complete by: As directed    Diet - low sodium heart healthy   Complete by: As directed    Diet Carb Modified   Complete by: As directed    Discharge instructions   Complete by: As directed    Advised to follow-up with primary care physician in 1 week. Advised to follow-up with neurology in 4 weeks. Advised to take aspirin and Eliquis for anticoagulation.   Increase activity slowly   Complete by: As directed       Allergies as of 02/08/2021       Reactions   Sotalol Other (See Comments)   Torsades   Tramadol Other (See Comments)   Hallucination, bad-dream   Actos [pioglitazone] Swelling   Adhesive [tape] Other (See Comments)   Causes sores   Atorvastatin Other (See Comments)   myalgia   Bactrim [sulfamethoxazole-trimethoprim] Itching, Rash   Itchy rash   Ciprofloxacin Nausea Only   Contrast Media [iodinated Contrast Media] Other (See Comments)   Headache (no action/pre-med required)   Latex Rash   Pedi-pre Tape Spray [wound Dressing Adhesive] Other (See Comments)   Causes sores   Sulfa Antibiotics Other (See Comments)   Causes sores        Medication List     TAKE these medications    acetaminophen 500 MG tablet Commonly known as: TYLENOL Take 1 tablet (500 mg total) by mouth every 6 (six) hours as needed for mild pain, fever or headache.   Alpha-Lipoic Acid 600 MG Tabs Take 600 mg by mouth daily. For neuropathy in legs   amiodarone 200 MG tablet Commonly known  as: PACERONE Take 1 tablet (200 mg total) by mouth daily.   aspirin 81 MG EC tablet Take 1 tablet (81 mg total) by mouth daily. Swallow whole. Start taking on: February 09, 2021   busPIRone 5 MG tablet Commonly known as: BUSPAR Take 1 tablet (5 mg total) by mouth 2 (two) times daily.   carboxymethylcellulose 0.5 % Soln Commonly known as: REFRESH PLUS Place 1 drop into both eyes 3 (three) times daily as needed (dry/irritated eyes.).   CENTRUM SILVER ULTRA WOMENS PO Take 1 tablet by mouth every morning.   Eliquis 2.5 MG Tabs tablet Generic drug: apixaban TAKE  (1)  TABLET TWICE A DAY. What changed: See the new instructions.   feeding supplement Liqd Take 237 mLs by mouth 2 (two) times daily between meals.   furosemide 20 MG tablet Commonly known as: LASIX Take 20 mg by mouth 3 (three) times a week.   gabapentin 100 MG capsule Commonly known as: NEURONTIN Take 1-3 capsules (100-300 mg total) by mouth at bedtime as needed.   isosorbide mononitrate 60 MG 24 hr tablet Commonly known as:  IMDUR Take 1 tablet (60 mg total) by mouth daily.   levothyroxine 100 MCG tablet Commonly known as: SYNTHROID Take 1 tablet (100 mcg total) by mouth every morning.   MELATONIN PO Take 3 mg by mouth at bedtime.   metoprolol succinate 25 MG 24 hr tablet Commonly known as: TOPROL-XL Take 0.5 tablets (12.5 mg total) by mouth daily. Take with or immediately following a meal. What changed: when to take this   nitroGLYCERIN 0.4 MG SL tablet Commonly known as: NITROSTAT PLACE 1 TABLET UNDER THE TONGUE AT ONSET OF CHEST PAIN EVERY 5 MINTUES UP TO 3 TIMES AS NEEDED What changed: See the new instructions.   pantoprazole 40 MG tablet Commonly known as: PROTONIX TAKE 1 TABLET ONCE DAILY   potassium chloride SA 20 MEQ tablet Commonly known as: KLOR-CON M Take 20 mEq by mouth 3 (three) times a week.   rosuvastatin 10 MG tablet Commonly known as: CRESTOR TAKE 1 TABLET DAILY   Vitamin D 50  MCG (2000 UT) Caps Take 2,000 Units by mouth every morning.        Follow-up Information     Phillips Odor, MD. Schedule an appointment as soon as possible for a visit in 1 month(s).   Specialty: Neurology Contact information: [address] [address] [phone]         [name], DO Follow up in 1 week(s).   Specialty: Family Medicine Contact information: [place] [address] [phone]         Sanda Klein, MD .   Specialty: Cardiology Contact information: [place] [address] [phone]                Allergies  Allergen Reactions   Sotalol Other (See Comments)    Torsades    Tramadol Other (See Comments)    Hallucination, bad-dream   Actos [Pioglitazone] Swelling   Adhesive [Tape] Other (See Comments)    Causes sores   Atorvastatin Other (See Comments)    myalgia   Bactrim [Sulfamethoxazole-Trimethoprim] Itching and Rash    Itchy rash   Ciprofloxacin Nausea Only   Contrast Media [Iodinated Contrast Media] Other (See Comments)    Headache (no action/pre-med required)   Latex Rash   Pedi-Pre Tape Spray [Wound Dressing Adhesive] Other (See Comments)    Causes sores   Sulfa Antibiotics Other (See Comments)    Causes sores    Consultations: Neurology   Procedures/Studies: CT Angio Head W or Wo Contrast  Result Date: 02/03/2021 CLINICAL DATA:  Left-sided weakness, stroke suspected EXAM: CT ANGIOGRAPHY HEAD AND NECK TECHNIQUE: Multidetector CT imaging of the head and neck was performed using the standard protocol during bolus administration of intravenous contrast. Multiplanar CT image reconstructions and MIPs were obtained to evaluate the vascular anatomy. Carotid stenosis measurements (when applicable) are obtained utilizing NASCET criteria, using the distal internal carotid diameter as the denominator. RADIATION DOSE REDUCTION: This exam was performed according to the  departmental dose-optimization program which includes automated exposure control, adjustment of the mA and/or kV according to patient size and/or use of iterative reconstruction technique. CONTRAST:  20mL OMNIPAQUE IOHEXOL 350 MG/ML SOLN COMPARISON:  No prior CTA, correlation is made with 02/03/2021 CT head FINDINGS: CT HEAD FINDINGS For noncontrast findings, please see same day CT head. CTA NECK FINDINGS Aortic arch: Standard branching. Imaged portion shows no evidence of aneurysm or dissection. No significant stenosis of the major arch vessel origins. Aortic atherosclerosis. Right carotid system: No  evidence of dissection, stenosis (50% or greater) or occlusion. Left carotid system: No evidence of dissection, stenosis (50% or greater) or occlusion. Vertebral arteries: Codominant. Calcifications at the origin of the right vertebral artery, which cause moderate stenosis; the right vertebral artery is otherwise patent. No evidence of dissection or occlusion. No other evidence of significant stenosis. Skeleton: No acute osseous abnormality. Other neck: Negative. Upper chest: Mild interlobular septal thickening and ground-glass opacities, possibly mild pulmonary edema. No pleural effusion. Review of the MIP images confirms the above findings CTA HEAD FINDINGS Anterior circulation: Both internal carotid arteries are patent to the termini, without mild calcifications but without significant stenosis A1 segments patent. Normal anterior communicating artery. Anterior cerebral arteries are patent to their distal aspects. No M1 stenosis or occlusion. Normal MCA bifurcations. Distal MCA branches perfused and symmetric. Posterior circulation: Vertebral arteries patent to the vertebrobasilar junction without stenosis. Posterior inferior cerebral arteries patent bilaterally. Basilar patent to its distal aspect. Superior cerebellar arteries patent bilaterally. Bilateral P1 segments originate from the basilar artery. Multifocal  narrowing in the bilateral P2 and P3 segments (series 505, images 216 and 215 on the on the right, and images 201, 215 and 225 on the left). Poor opacification of the left P3 segments distally, although evaluation is limited by venous contamination. Bilateral posterior communicating arteries are not visualized. Venous sinuses: As permitted by contrast timing, patent. Anatomic variants: None significant Review of the MIP images confirms the above findings IMPRESSION: 1. Multifocal narrowing in the bilateral P2 and P3 segments, with poor opacification of the distal P3 segments. No other significant intracranial stenosis. 2. Moderate narrowing at the origin of the right vertebral artery, which is otherwise patent. No other hemodynamically significant stenosis in the neck. These results were called by telephone at the time of interpretation on 02/03/2021 at 6:29 pm to provider [facility] , who verbally acknowledged these results. Electronically Signed   By: Merilyn Baba M.D.   On: 02/03/2021 18:30   CT HEAD WO CONTRAST (5MM)  Result Date: 02/04/2021 CLINICAL DATA:  Stroke follow-up EXAM: CT HEAD WITHOUT CONTRAST TECHNIQUE: Contiguous axial images were obtained from the base of the skull through the vertex without intravenous contrast. RADIATION DOSE REDUCTION: This exam was performed according to the departmental dose-optimization program which includes automated exposure control, adjustment of the mA and/or kV according to patient size and/or use of iterative reconstruction technique. COMPARISON:  CT head 02/03/2021 FINDINGS: Brain: Mild atrophy. Moderate white matter changes with patchy hypodensity throughout the cerebral white matter bilaterally. Ill-defined hypodensity left thalamus unchanged. Negative for acute cortical infarct, hemorrhage, mass Vascular: Negative for hyperdense vessel Skull: Negative Sinuses/Orbits: Mild mucosal edema right maxillary sinus. Remaining sinuses clear. Right cataract extraction  Other: None IMPRESSION: Atrophy and chronic microvascular ischemic change. No acute cortical infarct. Left thalamic hypodensity compatible with infarct of indeterminate age Negative for hemorrhage. Electronically Signed   By: Franchot Gallo M.D.   On: 02/04/2021 14:11   CT Angio Neck W and/or Wo Contrast  Result Date: 02/03/2021 CLINICAL DATA:  Left-sided weakness, stroke suspected EXAM: CT ANGIOGRAPHY HEAD AND NECK TECHNIQUE: Multidetector CT imaging of the head and neck was performed using the standard protocol during bolus administration of intravenous contrast. Multiplanar CT image reconstructions and MIPs were obtained to evaluate the vascular anatomy. Carotid stenosis measurements (when applicable) are obtained utilizing NASCET criteria, using the distal internal carotid diameter as the denominator. RADIATION DOSE REDUCTION: This exam was performed according to the departmental dose-optimization program which includes automated exposure  control, adjustment of the mA and/or kV according to patient size and/or use of iterative reconstruction technique. CONTRAST:  88mL OMNIPAQUE IOHEXOL 350 MG/ML SOLN COMPARISON:  No prior CTA, correlation is made with 02/03/2021 CT head FINDINGS: CT HEAD FINDINGS For noncontrast findings, please see same day CT head. CTA NECK FINDINGS Aortic arch: Standard branching. Imaged portion shows no evidence of aneurysm or dissection. No significant stenosis of the major arch vessel origins. Aortic atherosclerosis. Right carotid system: No evidence of dissection, stenosis (50% or greater) or occlusion. Left carotid system: No evidence of dissection, stenosis (50% or greater) or occlusion. Vertebral arteries: Codominant. Calcifications at the origin of the right vertebral artery, which cause moderate stenosis; the right vertebral artery is otherwise patent. No evidence of dissection or occlusion. No other evidence of significant stenosis. Skeleton: No acute osseous abnormality. Other  neck: Negative. Upper chest: Mild interlobular septal thickening and ground-glass opacities, possibly mild pulmonary edema. No pleural effusion. Review of the MIP images confirms the above findings CTA HEAD FINDINGS Anterior circulation: Both internal carotid arteries are patent to the termini, without mild calcifications but without significant stenosis A1 segments patent. Normal anterior communicating artery. Anterior cerebral arteries are patent to their distal aspects. No M1 stenosis or occlusion. Normal MCA bifurcations. Distal MCA branches perfused and symmetric. Posterior circulation: Vertebral arteries patent to the vertebrobasilar junction without stenosis. Posterior inferior cerebral arteries patent bilaterally. Basilar patent to its distal aspect. Superior cerebellar arteries patent bilaterally. Bilateral P1 segments originate from the basilar artery. Multifocal narrowing in the bilateral P2 and P3 segments (series 505, images 216 and 215 on the on the right, and images 201, 215 and 225 on the left). Poor opacification of the left P3 segments distally, although evaluation is limited by venous contamination. Bilateral posterior communicating arteries are not visualized. Venous sinuses: As permitted by contrast timing, patent. Anatomic variants: None significant Review of the MIP images confirms the above findings IMPRESSION: 1. Multifocal narrowing in the bilateral P2 and P3 segments, with poor opacification of the distal P3 segments. No other significant intracranial stenosis. 2. Moderate narrowing at the origin of the right vertebral artery, which is otherwise patent. No other hemodynamically significant stenosis in the neck. These results were called by telephone at the time of interpretation on 02/03/2021 at 6:29 pm to provider [facility] , who verbally acknowledged these results. Electronically Signed   By: Merilyn Baba M.D.   On: 02/03/2021 18:30   ECHOCARDIOGRAM COMPLETE  Result Date:  02/04/2021    ECHOCARDIOGRAM REPORT   Patient Name:   ARNETT GALINDEZ Date of Exam: 02/04/2021 Medical Rec #:  [id number]        Height:       63.0 in Accession #:    [id number]       Weight:       129.0 lb Date of Birth:  17-Dec-1931        BSA:          1.605 m Patient Age:    88 years         BP:           145/65 mmHg Patient Gender: F                HR:           56 bpm. Exam Location:  [facility] Procedure: 2D Echo, Cardiac Doppler and Color Doppler Indications:    TIA  History:        Patient has prior  history of Echocardiogram examinations, most                 recent 11/20/2019. CAD, Defibrillator, TIA,                 Arrythmias:Bradycardia and Atrial Fibrillation,                 Signs/Symptoms:Chest Pain; Risk Factors:Hypertension, Diabetes                 and Dyslipidemia.  Sonographer:    Wenda Low Referring Phys: [place]  1. Left ventricular ejection fraction, by estimation, is 50 to 55%. The left ventricle has low normal function. The left ventricle has no regional wall motion abnormalities. There is mild concentric left ventricular hypertrophy. Left ventricular diastolic parameters are consistent with Grade I diastolic dysfunction (impaired relaxation).  2. Right ventricular systolic function is normal. The right ventricular size is normal.  3. Left atrial size was severely dilated.  4. The mitral valve is degenerative. Trivial mitral valve regurgitation. No evidence of mitral stenosis.  5. The aortic valve is tricuspid. Aortic valve regurgitation is mild. No aortic stenosis is present.  6. Aortic dilatation noted. There is mild dilatation of the ascending aorta, measuring 41 mm. Comparison(s): No significant change from prior study. Conclusion(s)/Recommendation(s): No intracardiac source of embolism detected on this transthoracic study. Consider a transesophageal echocardiogram to exclude cardiac source of embolism if clinically indicated. FINDINGS  Left Ventricle:  Left ventricular ejection fraction, by estimation, is 50 to 55%. The left ventricle has low normal function. The left ventricle has no regional wall motion abnormalities. The left ventricular internal cavity size was normal in size. There is mild concentric left ventricular hypertrophy. Left ventricular diastolic parameters are consistent with Grade I diastolic dysfunction (impaired relaxation). Right Ventricle: The right ventricular size is normal. No increase in right ventricular wall thickness. Right ventricular systolic function is normal. Left Atrium: Left atrial size was severely dilated. Right Atrium: Right atrial size was normal in size. Pericardium: There is no evidence of pericardial effusion. Mitral Valve: The mitral valve is degenerative in appearance. Mild to moderate mitral annular calcification. Trivial mitral valve regurgitation. No evidence of mitral valve stenosis. MV peak gradient, 2.1 mmHg. The mean mitral valve gradient is 1.0 mmHg. Tricuspid Valve: The tricuspid valve is grossly normal. Tricuspid valve regurgitation is mild . No evidence of tricuspid stenosis. Aortic Valve: The aortic valve is tricuspid. Aortic valve regurgitation is mild. No aortic stenosis is present. Aortic valve mean gradient measures 3.0 mmHg. Aortic valve peak gradient measures 5.5 mmHg. Aortic valve area, by VTI measures 2.28 cm. Pulmonic Valve: The pulmonic valve was grossly normal. Pulmonic valve regurgitation is trivial. No evidence of pulmonic stenosis. Aorta: Aortic dilatation noted. There is mild dilatation of the ascending aorta, measuring 41 mm. Venous: The inferior vena cava was not well visualized. IAS/Shunts: The atrial septum is grossly normal. Additional Comments: A device lead is visualized in the right atrium and right ventricle.  LEFT VENTRICLE PLAX 2D LVIDd:         4.90 cm     Diastology LVIDs:         3.40 cm     LV e' medial:    4.95 cm/s LV PW:         1.20 cm     LV E/e' medial:  10.6 LV IVS:         1.20 cm     LV e' lateral:  8.85 cm/s LVOT diam:     2.00 cm     LV E/e' lateral: 5.9 LV SV:         63 LV SV Index:   40 LVOT Area:     3.14 cm  LV Volumes (MOD) LV vol d, MOD A2C: 46.3 ml LV vol d, MOD A4C: 39.5 ml LV vol s, MOD A2C: 23.8 ml LV vol s, MOD A4C: 14.8 ml LV SV MOD A2C:     22.5 ml LV SV MOD A4C:     39.5 ml LV SV MOD BP:      24.6 ml RIGHT VENTRICLE RV Basal diam:  3.35 cm RV Mid diam:    2.50 cm RV S prime:     9.68 cm/s TAPSE (M-mode): 2.4 cm LEFT ATRIUM             Index        RIGHT ATRIUM           Index LA diam:        4.10 cm 2.55 cm/m   RA Area:     16.70 cm LA Vol (A2C):   97.5 ml 60.76 ml/m  RA Volume:   44.80 ml  27.92 ml/m LA Vol (A4C):   80.6 ml 50.23 ml/m LA Biplane Vol: 91.7 ml 57.14 ml/m  AORTIC VALVE                    PULMONIC VALVE AV Area (Vmax):    2.40 cm     PV Vmax:       0.77 m/s AV Area (Vmean):   2.33 cm     PV Peak grad:  2.4 mmHg AV Area (VTI):     2.28 cm AV Vmax:           117.00 cm/s AV Vmean:          76.200 cm/s AV VTI:            0.278 m AV Peak Grad:      5.5 mmHg AV Mean Grad:      3.0 mmHg LVOT Vmax:         89.40 cm/s LVOT Vmean:        56.500 cm/s LVOT VTI:          0.202 m LVOT/AV VTI ratio: 0.73  AORTA Ao Root diam: 3.30 cm Ao Asc diam:  4.10 cm MITRAL VALVE               TRICUSPID VALVE MV Area (PHT): 2.22 cm    TR Peak grad:   27.0 mmHg MV Area VTI:   2.39 cm    TR Vmax:        260.00 cm/s MV Peak grad:  2.1 mmHg MV Mean grad:  1.0 mmHg    SHUNTS MV Vmax:       0.73 m/s    Systemic VTI:  0.20 m MV Vmean:      46.9 cm/s   Systemic Diam: 2.00 cm MV Decel Time: 341 msec MV E velocity: 52.30 cm/s MV A velocity: 74.10 cm/s MV E/A ratio:  0.71 Eleonore Chiquito MD Electronically signed by Eleonore Chiquito MD Signature Date/Time: 02/04/2021/12:12:36 PM    Final    CUP PACEART REMOTE DEVICE CHECK  Result Date: 01/19/2021 Scheduled remote reviewed. Normal device function.  Next remote 91 days. LR  CT HEAD CODE STROKE WO CONTRAST  Result Date:  02/03/2021 CLINICAL DATA:  Code stroke. EXAM: CT HEAD WITHOUT  CONTRAST TECHNIQUE: Contiguous axial images were obtained from the base of the skull through the vertex without intravenous contrast. RADIATION DOSE REDUCTION: This exam was performed according to the departmental dose-optimization program which includes automated exposure control, adjustment of the mA and/or kV according to patient size and/or use of iterative reconstruction technique. COMPARISON:  CT head 08/28/2018 FINDINGS: Brain: There is no evidence of acute intracranial hemorrhage, extra-axial fluid collection, or acute infarct. There is a background of mild global parenchymal volume loss with prominence of the ventricular system and extra-axial CSF spaces. There is hypodensity in the left thalamus which is increased in conspicuity compared to the study from 2020 but is favored to reflect a remote lacunar infarct. Additional hypodensity in the subcortical and periventricular white matter likely reflects sequela of moderate chronic white matter microangiopathy. There is no mass lesion.  There is no midline shift. Vascular: There is calcification of the bilateral cavernous ICAs. Skull: Normal. Negative for fracture or focal lesion. Sinuses/Orbits: The imaged paranasal sinuses are clear. A right lens implant is noted. The globes and orbits are otherwise unremarkable. Other: None. ASPECTS [facility] Stroke Program Early CT Score) - Ganglionic level infarction (caudate, lentiform nuclei, internal capsule, insula, M1-M3 cortex): 7 - Supraganglionic infarction (M4-M6 cortex): 3 Total score (0-10 with 10 being normal): 10 IMPRESSION: 1. No evidence of acute intracranial hemorrhage or infarct. 2. ASPECTS is 10 3. Small remote lacunar infarct in the left thalamus appears new since 2020. 4. Background of mild global parenchymal volume loss and moderate chronic white matter microangiopathy. These results were called by telephone at the time of interpretation on  02/03/2021 at 4:56 pm to provider [facility] , who verbally acknowledged these results. Electronically Signed   By: Valetta Mole M.D.   On: 02/03/2021 16:59     Echocardiogram, CTA head and neck.  Subjective: Patient was seen and examined at bedside.  Overnight events noted.   Patient reports feeling much improved.  She still has weak grip on the left side.  Discharge Exam: Vitals:   02/07/21 2033 02/08/21 0404  BP: (!) 185/85 (!) 174/82  Pulse: 61 62  Resp: 20 19  Temp: 98.7 F (37.1 C) 98.2 F (36.8 C)  SpO2: 99% 98%   Vitals:   02/07/21 0431 02/07/21 1444 02/07/21 2033 02/08/21 0404  BP: (!) 181/82 (!) 145/76 (!) 185/85 (!) 174/82  Pulse: 60 (!) 59 61 62  Resp: 19 18 20 19   Temp: 97.8 F (36.6 C) 97.8 F (36.6 C) 98.7 F (37.1 C) 98.2 F (36.8 C)  TempSrc: Oral Oral Oral   SpO2: 96% 99% 99% 98%  Weight:      Height:        General: Pt is alert, awake, not in acute distress Cardiovascular: RRR, S1/S2 +, no rubs, no gallops Respiratory: CTA bilaterally, no wheezing, no rhonchi Abdominal: Soft, NT, ND, bowel sounds + Extremities: No edema, no cyanosis, no clubbing.  Weak grip on the left side.    The results of significant diagnostics from this hospitalization (including imaging, microbiology, ancillary and laboratory) are listed below for reference.     Microbiology: Recent Results (from the past 240 hour(s))  Resp Panel by RT-PCR (Flu A&B, Covid) Nasopharyngeal Swab     Status: None   Collection Time: 02/03/21  5:26 PM   Specimen: Nasopharyngeal Swab; Nasopharyngeal(NP) swabs in vial transport medium  Result Value Ref Range Status   SARS Coronavirus 2 by RT PCR NEGATIVE NEGATIVE Final    Comment: (NOTE) SARS-CoV-2  target nucleic acids are NOT DETECTED.  The SARS-CoV-2 RNA is generally detectable in upper respiratory specimens during the acute phase of infection. The lowest concentration of SARS-CoV-2 viral copies this assay can detect is 138 copies/mL. A  negative result does not preclude SARS-Cov-2 infection and should not be used as the sole basis for treatment or other patient management decisions. A negative result may occur with  improper specimen collection/handling, submission of specimen other than nasopharyngeal swab, presence of viral mutation(s) within the areas targeted by this assay, and inadequate number of viral copies(<138 copies/mL). A negative result must be combined with clinical observations, patient history, and epidemiological information. The expected result is Negative.  Fact Sheet for Patients:  EntrepreneurPulse.com.au  Fact Sheet for Healthcare Providers:  IncredibleEmployment.be  This test is no t yet approved or cleared by the [place] FDA and  has been authorized for detection and/or diagnosis of SARS-CoV-2 by FDA under an Emergency Use Authorization (EUA). This EUA will remain  in effect (meaning this test can be used) for the duration of the COVID-19 declaration under Section 564(b)(1) of the Act, 21 U.S.C.section 360bbb-3(b)(1), unless the authorization is terminated  or revoked sooner.       Influenza A by PCR NEGATIVE NEGATIVE Final   Influenza B by PCR NEGATIVE NEGATIVE Final    Comment: (NOTE) The Xpert Xpress SARS-CoV-2/FLU/RSV plus assay is intended as an aid in the diagnosis of influenza from Nasopharyngeal swab specimens and should not be used as a sole basis for treatment. Nasal washings and aspirates are unacceptable for Xpert Xpress SARS-CoV-2/FLU/RSV testing.  Fact Sheet for Patients: EntrepreneurPulse.com.au  Fact Sheet for Healthcare Providers: IncredibleEmployment.be  This test is not yet approved or cleared by the [place] FDA and has been authorized for detection and/or diagnosis of SARS-CoV-2 by FDA under an Emergency Use Authorization (EUA). This EUA will remain in effect (meaning this test can  be used) for the duration of the COVID-19 declaration under Section 564(b)(1) of the Act, 21 U.S.C. section 360bbb-3(b)(1), unless the authorization is terminated or revoked.  Performed at [facility], [address], [place], [place] [address]      Labs: BNP (last 3 results) No results for input(s): BNP in the last 8760 hours. Basic Metabolic Panel: Recent Labs  Lab 02/03/21 1645 02/04/21 0431 02/08/21 0437  NA 137   138 142 140  K 3.6   4.4 3.5 2.9*  CL 105   106 107 109  CO2 24 24 25   GLUCOSE 112*   110* 94 88  BUN 21   28* 16 13  CREATININE 1.19*   1.20* 0.91 0.96  CALCIUM 9.0 8.9 8.5*  MG  --   --  1.7  PHOS  --   --  3.4   Liver Function Tests: Recent Labs  Lab 02/03/21 1645  AST 27  ALT 16  ALKPHOS 67  BILITOT 1.1  PROT 6.9  ALBUMIN 3.7   No results for input(s): LIPASE, AMYLASE in the last 168 hours. No results for input(s): AMMONIA in the last 168 hours. CBC: Recent Labs  Lab 02/03/21 1645 02/04/21 0431 02/08/21 0437  WBC 6.4 5.2 4.6  NEUTROABS 3.5  --   --   HGB 12.2   13.3 11.9* 10.8*  HCT 36.1   39.0 36.6 32.4*  MCV 96.0 94.8 96.7  PLT 219 212 195   Cardiac Enzymes: No results for input(s): CKTOTAL, CKMB, CKMBINDEX, TROPONINI in the last 168 hours. BNP: Invalid input(s): POCBNP CBG:  Recent Labs  Lab 02/03/21 1638  GLUCAP 88   D-Dimer No results for input(s): DDIMER in the last 72 hours. Hgb A1c No results for input(s): HGBA1C in the last 72 hours. Lipid Profile No results for input(s): CHOL, HDL, LDLCALC, TRIG, CHOLHDL, LDLDIRECT in the last 72 hours. Thyroid function studies No results for input(s): TSH, T4TOTAL, T3FREE, THYROIDAB in the last 72 hours.  Invalid input(s): FREET3 Anemia work up No results for input(s): VITAMINB12, FOLATE, FERRITIN, TIBC, IRON, RETICCTPCT in the last 72 hours. Urinalysis    Component Value Date/Time   COLORURINE COLORLESS (A) 02/03/2021 1804   APPEARANCEUR CLEAR 02/03/2021 1804   APPEARANCEUR  Cloudy (A) 09/07/2020 1213   LABSPEC 1.008 02/03/2021 1804   PHURINE 7.0 02/03/2021 1804   GLUCOSEU NEGATIVE 02/03/2021 1804   HGBUR SMALL (A) 02/03/2021 1804   BILIRUBINUR NEGATIVE 02/03/2021 1804   BILIRUBINUR Negative 09/07/2020 1213   KETONESUR NEGATIVE 02/03/2021 1804   PROTEINUR NEGATIVE 02/03/2021 1804   UROBILINOGEN negative 03/15/2015 1352   UROBILINOGEN 0.2 07/25/2011 2213   NITRITE NEGATIVE 02/03/2021 1804   LEUKOCYTESUR NEGATIVE 02/03/2021 1804   Sepsis Labs Invalid input(s): PROCALCITONIN,  WBC,  LACTICIDVEN Microbiology Recent Results (from the past 240 hour(s))  Resp Panel by RT-PCR (Flu A&B, Covid) Nasopharyngeal Swab     Status: None   Collection Time: 02/03/21  5:26 PM   Specimen: Nasopharyngeal Swab; Nasopharyngeal(NP) swabs in vial transport medium  Result Value Ref Range Status   SARS Coronavirus 2 by RT PCR NEGATIVE NEGATIVE Final    Comment: (NOTE) SARS-CoV-2 target nucleic acids are NOT DETECTED.  The SARS-CoV-2 RNA is generally detectable in upper respiratory specimens during the acute phase of infection. The lowest concentration of SARS-CoV-2 viral copies this assay can detect is 138 copies/mL. A negative result does not preclude SARS-Cov-2 infection and should not be used as the sole basis for treatment or other patient management decisions. A negative result may occur with  improper specimen collection/handling, submission of specimen other than nasopharyngeal swab, presence of viral mutation(s) within the areas targeted by this assay, and inadequate number of viral copies(<138 copies/mL). A negative result must be combined with clinical observations, patient history, and epidemiological information. The expected result is Negative.  Fact Sheet for Patients:  EntrepreneurPulse.com.au  Fact Sheet for Healthcare Providers:  IncredibleEmployment.be  This test is no t yet approved or cleared by the [place]  FDA and  has been authorized for detection and/or diagnosis of SARS-CoV-2 by FDA under an Emergency Use Authorization (EUA). This EUA will remain  in effect (meaning this test can be used) for the duration of the COVID-19 declaration under Section 564(b)(1) of the Act, 21 U.S.C.section 360bbb-3(b)(1), unless the authorization is terminated  or revoked sooner.       Influenza A by PCR NEGATIVE NEGATIVE Final   Influenza B by PCR NEGATIVE NEGATIVE Final    Comment: (NOTE) The Xpert Xpress SARS-CoV-2/FLU/RSV plus assay is intended as an aid in the diagnosis of influenza from Nasopharyngeal swab specimens and should not be used as a sole basis for treatment. Nasal washings and aspirates are unacceptable for Xpert Xpress SARS-CoV-2/FLU/RSV testing.  Fact Sheet for Patients: EntrepreneurPulse.com.au  Fact Sheet for Healthcare Providers: IncredibleEmployment.be  This test is not yet approved or cleared by the [place] FDA and has been authorized for detection and/or diagnosis of SARS-CoV-2 by FDA under an Emergency Use Authorization (EUA). This EUA will remain in effect (meaning this test can be used) for the duration of  the COVID-19 declaration under Section 564(b)(1) of the Act, 21 U.S.C. section 360bbb-3(b)(1), unless the authorization is terminated or revoked.  Performed at [facility], [address] [place], [place] [address]      Time coordinating discharge: Over 30 minutes  SIGNED:   Shawna Clamp, MD  [facility] 02/08/2021, 11:15 AM Pager   If 7PM-7AM, please contact night-coverage

## 2021-02-08 NOTE — Discharge Instructions (Signed)
Advised to follow-up with primary care physician in 1 week. Advised to follow-up with neurology in 4 weeks. Advised to take aspirin and Eliquis for anticoagulation.

## 2021-02-08 NOTE — TOC Transition Note (Signed)
Transition of Care [facility]) - CM/SW Discharge Note   Patient Details  Name: LENDY DITTRICH MRN: [id number] Date of Birth: 02-Mar-1931  Transition of [facility]) CM/SW Contact:  Iona Beard, [place] Phone Number: 02/08/2021, 11:50 AM   Clinical Narrative:    TOC made aware that pt is ready for D/C today to [facility]. CSW spoke to [place] with the facility who states that pt can arrive today and will go to [phone]. CSW updated RN of bed and number for report. CSW updated pts family of d/c to facility. CSW to set up transportation when RN states pt is ready. TOC signing off.   Final next level of care: Skilled Nursing Facility Barriers to Discharge: Barriers Resolved   Patient Goals and CMS Choice Patient states their goals for this hospitalization and ongoing recovery are:: Go to SNF CMS Medicare.gov Compare Post Acute Care list provided to:: Patient Choice offered to / list presented to : Patient  Discharge Placement              Patient chooses bed at: [facility] Patient to be transferred to facility by: [facility] Name of family member notified: Kenecia, Barren and Hilda Blades (Son)   [phone] Patient and family notified of of transfer: 02/08/21  Discharge Plan and Services In-house Referral: Clinical Social Work                                   Social Determinants of Health ([place]) Interventions     Readmission Risk Interventions Readmission Risk Prevention Plan 06/23/2020 03/28/2020 11/17/2019  Transportation Screening - Complete Complete  Medication Review Press photographer) Complete Complete Complete  PCP or Specialist appointment within 3-5 days of discharge Complete - Complete  HRI or [place] Complete Complete Complete  SW Recovery Care/Counseling Consult Complete Complete Complete  Palliative Care Screening Not Applicable Not Applicable Not [place] Patient Refused Not Applicable Not Applicable  Some recent  data might be hidden

## 2021-02-08 NOTE — Plan of Care (Signed)
  Problem: Education: Goal: Knowledge of General Education information will improve Description: Including pain rating scale, medication(s)/side effects and non-pharmacologic comfort measures Outcome: Progressing   Problem: Health Behavior/Discharge Planning: Goal: Ability to manage health-related needs will improve Outcome: Progressing   Problem: Clinical Measurements: Goal: Ability to maintain clinical measurements within normal limits will improve Outcome: Progressing Goal: Will remain free from infection Outcome: Progressing Goal: Diagnostic test results will improve Outcome: Progressing Goal: Respiratory complications will improve Outcome: Progressing Goal: Cardiovascular complication will be avoided Outcome: Progressing   Problem: Activity: Goal: Risk for activity intolerance will decrease Outcome: Progressing   Problem: Nutrition: Goal: Adequate nutrition will be maintained Outcome: Progressing   Problem: Coping: Goal: Level of anxiety will decrease Outcome: Progressing   Problem: Elimination: Goal: Will not experience complications related to bowel motility Outcome: Progressing Goal: Will not experience complications related to urinary retention Outcome: Progressing   Problem: Pain Managment: Goal: General experience of comfort will improve Outcome: Progressing   Problem: Safety: Goal: Ability to remain free from injury will improve Outcome: Progressing   Problem: Skin Integrity: Goal: Risk for impaired skin integrity will decrease Outcome: Progressing   Problem: Education: Goal: Knowledge of disease or condition will improve Outcome: Progressing Goal: Knowledge of secondary prevention will improve (SELECT ALL) Outcome: Progressing Goal: Knowledge of patient specific risk factors will improve (INDIVIDUALIZE FOR PATIENT) Outcome: Progressing Goal: Individualized Educational Video(s) Outcome: Progressing   Problem: Coping: Goal: Will verbalize  positive feelings about self Outcome: Progressing Goal: Will identify appropriate support needs Outcome: Progressing   Problem: Health Behavior/Discharge Planning: Goal: Ability to manage health-related needs will improve Outcome: Progressing   Problem: Self-Care: Goal: Ability to participate in self-care as condition permits will improve Outcome: Progressing Goal: Verbalization of feelings and concerns over difficulty with self-care will improve Outcome: Progressing Goal: Ability to communicate needs accurately will improve Outcome: Progressing   Problem: Nutrition: Goal: Risk of aspiration will decrease Outcome: Progressing   Problem: Ischemic Stroke/TIA Tissue Perfusion: Goal: Complications of ischemic stroke/TIA will be minimized Outcome: Progressing   

## 2021-02-10 DIAGNOSIS — I10 Essential (primary) hypertension: Secondary | ICD-10-CM | POA: Diagnosis not present

## 2021-02-10 DIAGNOSIS — I639 Cerebral infarction, unspecified: Secondary | ICD-10-CM | POA: Diagnosis not present

## 2021-02-14 DIAGNOSIS — E119 Type 2 diabetes mellitus without complications: Secondary | ICD-10-CM | POA: Diagnosis not present

## 2021-02-15 DIAGNOSIS — I639 Cerebral infarction, unspecified: Secondary | ICD-10-CM | POA: Diagnosis not present

## 2021-02-15 DIAGNOSIS — I2510 Atherosclerotic heart disease of native coronary artery without angina pectoris: Secondary | ICD-10-CM | POA: Diagnosis not present

## 2021-02-17 DIAGNOSIS — R0981 Nasal congestion: Secondary | ICD-10-CM | POA: Diagnosis not present

## 2021-02-17 DIAGNOSIS — R059 Cough, unspecified: Secondary | ICD-10-CM | POA: Diagnosis not present

## 2021-02-18 ENCOUNTER — Other Ambulatory Visit: Payer: Self-pay | Admitting: Cardiovascular Disease

## 2021-02-18 ENCOUNTER — Other Ambulatory Visit: Payer: Self-pay | Admitting: Family Medicine

## 2021-02-18 NOTE — Telephone Encounter (Signed)
Prescription refill request for Eliquis received. Indication:Afib Last office visit:1/23 Scr:0.9 Age: 88 Weight:58.5  Prescription refilled

## 2021-02-21 DIAGNOSIS — I10 Essential (primary) hypertension: Secondary | ICD-10-CM | POA: Diagnosis not present

## 2021-02-21 DIAGNOSIS — R5381 Other malaise: Secondary | ICD-10-CM | POA: Diagnosis not present

## 2021-02-21 DIAGNOSIS — E119 Type 2 diabetes mellitus without complications: Secondary | ICD-10-CM | POA: Diagnosis not present

## 2021-02-21 DIAGNOSIS — I2510 Atherosclerotic heart disease of native coronary artery without angina pectoris: Secondary | ICD-10-CM | POA: Diagnosis not present

## 2021-02-25 DIAGNOSIS — Z7982 Long term (current) use of aspirin: Secondary | ICD-10-CM | POA: Diagnosis not present

## 2021-02-25 DIAGNOSIS — N1831 Chronic kidney disease, stage 3a: Secondary | ICD-10-CM | POA: Diagnosis not present

## 2021-02-25 DIAGNOSIS — F419 Anxiety disorder, unspecified: Secondary | ICD-10-CM | POA: Diagnosis not present

## 2021-02-25 DIAGNOSIS — R42 Dizziness and giddiness: Secondary | ICD-10-CM | POA: Diagnosis not present

## 2021-02-25 DIAGNOSIS — I129 Hypertensive chronic kidney disease with stage 1 through stage 4 chronic kidney disease, or unspecified chronic kidney disease: Secondary | ICD-10-CM | POA: Diagnosis not present

## 2021-02-25 DIAGNOSIS — E039 Hypothyroidism, unspecified: Secondary | ICD-10-CM | POA: Diagnosis not present

## 2021-02-25 DIAGNOSIS — I69354 Hemiplegia and hemiparesis following cerebral infarction affecting left non-dominant side: Secondary | ICD-10-CM | POA: Diagnosis not present

## 2021-02-25 DIAGNOSIS — I2510 Atherosclerotic heart disease of native coronary artery without angina pectoris: Secondary | ICD-10-CM | POA: Diagnosis not present

## 2021-02-25 DIAGNOSIS — E1122 Type 2 diabetes mellitus with diabetic chronic kidney disease: Secondary | ICD-10-CM | POA: Diagnosis not present

## 2021-02-25 DIAGNOSIS — R1314 Dysphagia, pharyngoesophageal phase: Secondary | ICD-10-CM | POA: Diagnosis not present

## 2021-02-25 DIAGNOSIS — K5900 Constipation, unspecified: Secondary | ICD-10-CM | POA: Diagnosis not present

## 2021-02-25 DIAGNOSIS — E1140 Type 2 diabetes mellitus with diabetic neuropathy, unspecified: Secondary | ICD-10-CM | POA: Diagnosis not present

## 2021-02-25 DIAGNOSIS — I482 Chronic atrial fibrillation, unspecified: Secondary | ICD-10-CM | POA: Diagnosis not present

## 2021-02-25 DIAGNOSIS — Z95810 Presence of automatic (implantable) cardiac defibrillator: Secondary | ICD-10-CM | POA: Diagnosis not present

## 2021-02-25 DIAGNOSIS — M6281 Muscle weakness (generalized): Secondary | ICD-10-CM | POA: Diagnosis not present

## 2021-02-25 DIAGNOSIS — Z7901 Long term (current) use of anticoagulants: Secondary | ICD-10-CM | POA: Diagnosis not present

## 2021-02-25 DIAGNOSIS — K219 Gastro-esophageal reflux disease without esophagitis: Secondary | ICD-10-CM | POA: Diagnosis not present

## 2021-02-25 DIAGNOSIS — E785 Hyperlipidemia, unspecified: Secondary | ICD-10-CM | POA: Diagnosis not present

## 2021-02-28 ENCOUNTER — Ambulatory Visit (INDEPENDENT_AMBULATORY_CARE_PROVIDER_SITE_OTHER): Payer: Medicare Other | Admitting: Family Medicine

## 2021-02-28 ENCOUNTER — Encounter: Payer: Self-pay | Admitting: Family Medicine

## 2021-02-28 VITALS — BP 161/73 | HR 55 | Temp 98.2°F | Ht 63.0 in | Wt 121.2 lb

## 2021-02-28 DIAGNOSIS — I129 Hypertensive chronic kidney disease with stage 1 through stage 4 chronic kidney disease, or unspecified chronic kidney disease: Secondary | ICD-10-CM | POA: Diagnosis not present

## 2021-02-28 DIAGNOSIS — F411 Generalized anxiety disorder: Secondary | ICD-10-CM | POA: Diagnosis not present

## 2021-02-28 DIAGNOSIS — I480 Paroxysmal atrial fibrillation: Secondary | ICD-10-CM | POA: Diagnosis not present

## 2021-02-28 DIAGNOSIS — I5032 Chronic diastolic (congestive) heart failure: Secondary | ICD-10-CM | POA: Diagnosis not present

## 2021-02-28 DIAGNOSIS — Z8673 Personal history of transient ischemic attack (TIA), and cerebral infarction without residual deficits: Secondary | ICD-10-CM | POA: Diagnosis not present

## 2021-02-28 DIAGNOSIS — R809 Proteinuria, unspecified: Secondary | ICD-10-CM | POA: Diagnosis not present

## 2021-02-28 DIAGNOSIS — N1832 Chronic kidney disease, stage 3b: Secondary | ICD-10-CM | POA: Diagnosis not present

## 2021-02-28 DIAGNOSIS — M792 Neuralgia and neuritis, unspecified: Secondary | ICD-10-CM

## 2021-02-28 DIAGNOSIS — F321 Major depressive disorder, single episode, moderate: Secondary | ICD-10-CM | POA: Diagnosis not present

## 2021-02-28 DIAGNOSIS — R630 Anorexia: Secondary | ICD-10-CM | POA: Diagnosis not present

## 2021-02-28 MED ORDER — BUSPIRONE HCL 7.5 MG PO TABS: ORAL_TABLET | ORAL | 0 refills | Status: DC

## 2021-02-28 NOTE — Progress Notes (Signed)
Subjective: CC: Hospital discharge follow-up PCP: [name], DO HWE:[id number] [name] is a 88 y.o. female presenting to clinic today for:  1.  CVA Patient sustained a CVA, which was not visualized on imaging but suspected by neurology.  Unfortunately MRI could not be obtained secondary to AICD in place.  She was placed on aspirin along with her Eliquis.  Crestor recommended.  She needs a referral to Dr. Merlene Laughter, who saw her in the hospital for hospital follow-up.  From a physical standpoint, she seems to be in pretty good shape.  She is really not had any residual motor deficits since the stroke.  However, she has had some blurred vision and plans to schedule with Dr. Katy Fitch soon for visual evaluation.  She has an appointment with her cardiologist on 05/09/2021.  2.  Anxiety and depression Patient reports ongoing anxiety and depression despite use of BuSpar 5 mg twice daily.  She does wish to advance this medication as she does not feel that symptoms are well controlled at present.  She continues to suffer from chronic abdominal pain and will be seeing her gastroenterologist soon. ROS: Per HPI  Allergies  Allergen Reactions   Sotalol Other (See Comments)    Torsades    Tramadol Other (See Comments)    Hallucination, bad-dream   Actos [Pioglitazone] Swelling   Adhesive [Tape] Other (See Comments)    Causes sores   Atorvastatin Other (See Comments)    myalgia   Bactrim [Sulfamethoxazole-Trimethoprim] Itching and Rash    Itchy rash   Ciprofloxacin Nausea Only   Contrast Media [Iodinated Contrast Media] Other (See Comments)    Headache (no action/pre-med required)   Latex Rash   Pedi-Pre Tape Spray [Wound Dressing Adhesive] Other (See Comments)    Causes sores   Sulfa Antibiotics Other (See Comments)    Causes sores   Past Medical History:  Diagnosis Date   AICD (automatic cardioverter/defibrillator) present    Allergy    SESONAL   Aneurysm of infrarenal abdominal  aorta    Anxiety    ARTHRITIS    Arthritis    Atrial fibrillation (HCC)    CAD (coronary artery disease)    a. history of cardiac arrest 1993;  b. s/p LAD/LCX stenting in 2003;  c. 07/2011 Cath: LM nl, LAD patent stent, D1 80ost, LCX patent stent, RCA min irregs;  d. 04/2012 MV: EF 66%, no ishcemia;  e. 12/2013 Echo: Ef 55%, no rwma, Gr 1 DD, triv AI/MR, mildly dil LA;  f. 12/2013 Lexi MV: intermediate risk - apical ischemia and inf/infsept fixed defect, ? artifactual.   CAD in native artery 12/31/2013   Previous stents to LAD and Ramus patent on cath today 12/31/13 also There is severe disease in the ostial first diagonal which is unchanged from most recent cardiac catheterization. The right coronary artery could not be engaged selectively but nonselective angiography showed no significant disease in the proximal and midsegment.       Cataract    DENIES   Chronic back pain    Chronic sinus bradycardia    CKD (chronic kidney disease), stage III (HCC)    Clotting disorder ([place])    DVT   COLITIS 12/02/2007   Qualifier: Diagnosis of  By: [name], [name]     Diabetes mellitus without complication ([place])    DENIES   Diverticulosis of colon (without mention of hemorrhage) 2007   Colonoscopy    Esophageal stricture    a. 2012 s/p dil.  Esophagitis, unspecified    a. 2012 EGD   EXTERNAL HEMORRHOIDS    GERD (gastroesophageal reflux disease)    omeprazole   H/O hiatal hernia    Hiatal hernia    a. 2012 EGD.   History of DVT in the past, not on Coumadin now    left  leg   HYPERCHOLESTEROLEMIA    Hypertension    ICD (implantable cardiac defibrillator) in place    a. s/p initial ICD in 1993 in setting of cardiac arrest;  b. 01/2007 gen change: Guidant T135 Vitality DS VR single lead ICD.   Macular degeneration    gets injecton in eye every 5 weeks- last injection - 05/03/2013    Myocardial infarction [facility]) 1993   Near syncope 05/22/2015   Nephrolithiasis, just saw Dr [name]     Orthostatic hypotension    Peripheral vascular disease ([place])    ???   RECTAL BLEEDING 12/03/2007   Qualifier: Diagnosis of  By: [name]    Unspecified gastritis and gastroduodenitis without mention of hemorrhage    a. 2003 EGD->not noted on 2012 EGD.   Unstable angina, neg MI, cath stable.maybe GI 12/30/2013    Current Outpatient Medications:    acetaminophen (TYLENOL) 500 MG tablet, Take 1 tablet (500 mg total) by mouth every 6 (six) hours as needed for mild pain, fever or headache., Disp: 30 tablet, Rfl: 0   Alpha-Lipoic Acid 600 MG TABS, Take 600 mg by mouth daily. For neuropathy in legs, Disp: 90 tablet, Rfl: 3   amiodarone (PACERONE) 200 MG tablet, Take 1 tablet (200 mg total) by mouth daily., Disp: 90 tablet, Rfl: 3   aspirin EC 81 MG EC tablet, Take 1 tablet (81 mg total) by mouth daily. Swallow whole., Disp: 30 tablet, Rfl: 11   busPIRone (BUSPAR) 5 MG tablet, Take 1 tablet (5 mg total) by mouth 2 (two) times daily., Disp: 60 tablet, Rfl: 1   carboxymethylcellulose (REFRESH PLUS) 0.5 % SOLN, Place 1 drop into both eyes 3 (three) times daily as needed (dry/irritated eyes.)., Disp: , Rfl:    Cholecalciferol (VITAMIN D) 50 MCG (2000 UT) CAPS, Take 2,000 Units by mouth every morning., Disp: , Rfl:    ELIQUIS 2.5 MG TABS tablet, TAKE (1) TABLET TWICE A DAY., Disp: 180 tablet, Rfl: 1   feeding supplement (ENSURE ENLIVE / ENSURE PLUS) LIQD, Take 237 mLs by mouth 2 (two) times daily between meals., Disp: , Rfl:    furosemide (LASIX) 20 MG tablet, Take 20 mg by mouth 3 (three) times a week., Disp: , Rfl:    gabapentin (NEURONTIN) 100 MG capsule, Take 1-3 capsules (100-300 mg total) by mouth at bedtime as needed., Disp: 90 capsule, Rfl: 3   isosorbide mononitrate (IMDUR) 60 MG 24 hr tablet, Take 1 tablet (60 mg total) by mouth daily., Disp: 30 tablet, Rfl: 11   levothyroxine (SYNTHROID) 100 MCG tablet, Take 1 tablet (100 mcg total) by mouth every morning., Disp: 90 tablet,  Rfl: 1   MELATONIN PO, Take 3 mg by mouth at bedtime., Disp: , Rfl:    metoprolol succinate (TOPROL-XL) 25 MG 24 hr tablet, Take 0.5 tablets (12.5 mg total) by mouth daily. Take with or immediately following a meal. (Patient taking differently: Take 12.5 mg by mouth 2 (two) times daily. Take with or immediately following a meal.), Disp: 15 tablet, Rfl: 11   Multiple Vitamins-Minerals (CENTRUM SILVER ULTRA WOMENS PO), Take 1 tablet by mouth every morning., Disp: , Rfl:  nitroGLYCERIN (NITROSTAT) 0.4 MG SL tablet, PLACE 1 TABLET UNDER THE TONGUE AT ONSET OF CHEST PAIN EVERY 5 MINTUES UP TO 3 TIMES AS NEEDED (Patient taking differently: Place 0.4 mg under the tongue every 5 (five) minutes as needed.), Disp: 25 tablet, Rfl: 2   pantoprazole (PROTONIX) 40 MG tablet, TAKE 1 TABLET ONCE DAILY, Disp: 90 tablet, Rfl: 0   potassium chloride SA (KLOR-CON) 20 MEQ tablet, Take 20 mEq by mouth 3 (three) times a week., Disp: , Rfl:    rosuvastatin (CRESTOR) 10 MG tablet, TAKE 1 TABLET DAILY, Disp: 90 tablet, Rfl: 0 Social History   Socioeconomic History   Marital status: Widowed    Spouse name: Not on file   Number of children: 4   Years of education: 8   Highest education level: 8th grade  Occupational History   Occupation: Retired  Tobacco Use   Smoking status: Never   Smokeless tobacco: Never  Vaping Use   Vaping Use: Never used  Substance and Sexual Activity   Alcohol use: No   Drug use: No   Sexual activity: Never    Birth control/protection: Post-menopausal  Other Topics Concern   Not on file  Social History Narrative   Lives alone - son visits Sundays, daughter visits Saturday   Has an aide Chiquita Loth) 5 days per week   Social Determinants of Radio broadcast assistant Strain: Low Risk    Difficulty of Paying Living Expenses: Not hard at all  Food Insecurity: No Food Insecurity   Worried About Charity fundraiser in the Last Year: Never true   Arboriculturist in the Last  Year: Never true  Transportation Needs: No Transportation Needs   Lack of Transportation (Medical): No   Lack of Transportation (Non-Medical): No  Physical Activity: Inactive   Days of Exercise per Week: 0 days   Minutes of Exercise per Session: 0 min  Stress: Stress Concern Present   Feeling of Stress : To some extent  Social Connections: Moderately Isolated   Frequency of Communication with Friends and Family: More than three times a week   Frequency of Social Gatherings with Friends and Family: Twice a week   Attends Religious Services: 1 to 4 times per year   Active Member of [employer] or Organizations: No   Attends Archivist Meetings: Never   Marital Status: Widowed  Human resources officer Violence: Not At Risk   Fear of Current or Ex-Partner: No   Emotionally Abused: No   Physically Abused: No   Sexually Abused: No   Family History  Problem Relation Age of Onset   Stroke Mother    Other Mother        brain tumor   Other Father        MI   Heart attack Father    Stroke Sister    Macular degeneration Sister    Diabetes Daughter    Cancer Daughter        ovarian   Colon polyps Daughter    Atrial fibrillation Sister    Hyperlipidemia Sister    Osteoporosis Sister    Stroke Sister    Uterine cancer Sister    Heart attack Brother    Heart disease Brother    Asthma Brother    Breast cancer Neg Hx     Objective: Office vital signs reviewed. BP (!) 161/73    Pulse (!) 55    Temp 98.2 F (36.8 C)  Ht 5\' 3"  (1.6 m)    Wt 121 lb 3.2 oz (55 kg)    SpO2 96%    BMI 21.47 kg/m   Physical Examination:  General: Awake, alert, chronically ill-appearing elderly female, No acute distress HEENT: Sclera white Cardio: Irregularly irregular with rate control, S1S2 heard, no murmurs appreciated Pulm: clear to auscultation bilaterally, no wheezes, rhonchi or rales; normal work of breathing on room air MSK: Ambulating independently Neuro: Alert and oriented.  Follows  commands Psych: Anxious and depressed  Depression screen [facility] 2/9 02/28/2021 02/01/2021 01/14/2021  Decreased Interest 3 3 1   Down, Depressed, Hopeless 3 3 1   PHQ - 2 Score 6 6 2   Altered sleeping 3 3 2   Tired, decreased energy 3 3 2   Change in appetite 3 3 2   Feeling bad or failure about yourself  3 0 1  Trouble concentrating 1 0 1  Moving slowly or fidgety/restless 1 0 1  Suicidal thoughts 2 0 0  PHQ-9 Score 22 15 11   Difficult doing work/chores Somewhat difficult Very difficult Somewhat difficult  Some recent data might be hidden   GAD 7 : Generalized Anxiety Score 02/28/2021 02/01/2021 11/05/2020 10/05/2020  Nervous, Anxious, on Edge 3 3 1 1   Control/stop worrying 3 3 1 2   Worry too much - different things 3 3 2 1   Trouble relaxing 3 3 1 2   Restless 3 0 1 1  Easily annoyed or irritable 3 3 2 1   Afraid - awful might happen 3 3 1 1   Total GAD 7 Score 21 18 9 9   Anxiety Difficulty Very difficult Very difficult Somewhat difficult Somewhat difficult   Assessment/ Plan: 88 y.o. female   History of stroke in adulthood - Plan: Ambulatory referral to Neurology  Generalized anxiety disorder - Plan: busPIRone (BUSPAR) 7.5 MG tablet  Depression, major, single episode, moderate (HCC) - Plan: busPIRone (BUSPAR) 7.5 MG tablet  Poor appetite - Plan: busPIRone (BUSPAR) 7.5 MG tablet  Paroxysmal atrial fibrillation, chads2 Vasc2 score of 5, on eliquis  Peripheral neuralgia - Plan: Ambulatory referral to Neurology  I reviewed her records in the EMR.  Referral to neurology placed.  She has an appointment scheduled on the 27th already.  She seems to be recovering well from a neurologic standpoint.  Though her blood pressures have remained somewhat elevated despite compliance with other medications.  She is on Eliquis currently and being treated with aspirin as well.  We discussed that this may be a dual regimen for the next several months but that neurology would advise her when to go back to  Eliquis only.  Anxiety depression are not well controlled and this may be what manifest her abdominal pain.  I am going to advance her buspirone to 7.5 mg twice daily and we discussed gradual titration of this medication until we find a therapeutic dose for her.  I would like to reconvene with her in about a month to ensure that this is getting better.  Additionally, I will reach out to her cardiologist and nephrologist.  Her blood pressure is not controlled.  We have hesitated on advancing her blood pressure regimen in the past for concerns of advanced age, chronic anticoagulation and possibility of orthostasis.  Though given recent events with CVA I am inclined to be a little bit more aggressive about blood pressure control.  I am considering Norvasc 2.5 mg daily but want to make sure that this is an okay addition to her current amiodarone and beta-blocker.  No  orders of the defined types were placed in this encounter.  No orders of the defined types were placed in this encounter.    [name], DO [place] [phone]

## 2021-02-28 NOTE — Patient Instructions (Signed)
Increase water intake!  Mucinex will not work without enough water in your body. Buspirone increased.  You may increase by 1/2 tablet each week if needed for ongoing symptoms.

## 2021-03-01 ENCOUNTER — Other Ambulatory Visit: Payer: Self-pay | Admitting: Family Medicine

## 2021-03-01 DIAGNOSIS — I10 Essential (primary) hypertension: Secondary | ICD-10-CM

## 2021-03-01 MED ORDER — AMLODIPINE BESYLATE 2.5 MG PO TABS: 2.5000 mg | ORAL_TABLET | Freq: Every day | ORAL | 3 refills | Status: DC

## 2021-03-01 NOTE — Progress Notes (Signed)
[name] aware of norvasc 2.5 mg and how to  give

## 2021-03-02 ENCOUNTER — Other Ambulatory Visit: Payer: Medicare Other | Admitting: Family Medicine

## 2021-03-02 ENCOUNTER — Other Ambulatory Visit: Payer: Self-pay

## 2021-03-03 DIAGNOSIS — R1314 Dysphagia, pharyngoesophageal phase: Secondary | ICD-10-CM | POA: Diagnosis not present

## 2021-03-03 DIAGNOSIS — M6281 Muscle weakness (generalized): Secondary | ICD-10-CM | POA: Diagnosis not present

## 2021-03-03 DIAGNOSIS — I69354 Hemiplegia and hemiparesis following cerebral infarction affecting left non-dominant side: Secondary | ICD-10-CM | POA: Diagnosis not present

## 2021-03-03 DIAGNOSIS — I129 Hypertensive chronic kidney disease with stage 1 through stage 4 chronic kidney disease, or unspecified chronic kidney disease: Secondary | ICD-10-CM | POA: Diagnosis not present

## 2021-03-03 DIAGNOSIS — E1140 Type 2 diabetes mellitus with diabetic neuropathy, unspecified: Secondary | ICD-10-CM | POA: Diagnosis not present

## 2021-03-03 DIAGNOSIS — I482 Chronic atrial fibrillation, unspecified: Secondary | ICD-10-CM | POA: Diagnosis not present

## 2021-03-08 ENCOUNTER — Ambulatory Visit: Payer: Medicare Other | Admitting: Family Medicine

## 2021-03-08 DIAGNOSIS — I69354 Hemiplegia and hemiparesis following cerebral infarction affecting left non-dominant side: Secondary | ICD-10-CM | POA: Diagnosis not present

## 2021-03-08 DIAGNOSIS — I129 Hypertensive chronic kidney disease with stage 1 through stage 4 chronic kidney disease, or unspecified chronic kidney disease: Secondary | ICD-10-CM | POA: Diagnosis not present

## 2021-03-08 DIAGNOSIS — I482 Chronic atrial fibrillation, unspecified: Secondary | ICD-10-CM | POA: Diagnosis not present

## 2021-03-08 DIAGNOSIS — M79676 Pain in unspecified toe(s): Secondary | ICD-10-CM | POA: Diagnosis not present

## 2021-03-08 DIAGNOSIS — R1314 Dysphagia, pharyngoesophageal phase: Secondary | ICD-10-CM | POA: Diagnosis not present

## 2021-03-08 DIAGNOSIS — L84 Corns and callosities: Secondary | ICD-10-CM | POA: Diagnosis not present

## 2021-03-08 DIAGNOSIS — B351 Tinea unguium: Secondary | ICD-10-CM | POA: Diagnosis not present

## 2021-03-08 DIAGNOSIS — E1140 Type 2 diabetes mellitus with diabetic neuropathy, unspecified: Secondary | ICD-10-CM | POA: Diagnosis not present

## 2021-03-08 DIAGNOSIS — I70203 Unspecified atherosclerosis of native arteries of extremities, bilateral legs: Secondary | ICD-10-CM | POA: Diagnosis not present

## 2021-03-08 DIAGNOSIS — M6281 Muscle weakness (generalized): Secondary | ICD-10-CM | POA: Diagnosis not present

## 2021-03-09 DIAGNOSIS — I69354 Hemiplegia and hemiparesis following cerebral infarction affecting left non-dominant side: Secondary | ICD-10-CM | POA: Diagnosis not present

## 2021-03-09 DIAGNOSIS — E1140 Type 2 diabetes mellitus with diabetic neuropathy, unspecified: Secondary | ICD-10-CM | POA: Diagnosis not present

## 2021-03-09 DIAGNOSIS — I129 Hypertensive chronic kidney disease with stage 1 through stage 4 chronic kidney disease, or unspecified chronic kidney disease: Secondary | ICD-10-CM | POA: Diagnosis not present

## 2021-03-09 DIAGNOSIS — Z20822 Contact with and (suspected) exposure to covid-19: Secondary | ICD-10-CM | POA: Diagnosis not present

## 2021-03-09 DIAGNOSIS — I482 Chronic atrial fibrillation, unspecified: Secondary | ICD-10-CM | POA: Diagnosis not present

## 2021-03-09 DIAGNOSIS — R1314 Dysphagia, pharyngoesophageal phase: Secondary | ICD-10-CM | POA: Diagnosis not present

## 2021-03-09 DIAGNOSIS — M6281 Muscle weakness (generalized): Secondary | ICD-10-CM | POA: Diagnosis not present

## 2021-03-14 ENCOUNTER — Encounter (INDEPENDENT_AMBULATORY_CARE_PROVIDER_SITE_OTHER): Payer: Medicare Other | Admitting: Ophthalmology

## 2021-03-14 DIAGNOSIS — I6789 Other cerebrovascular disease: Secondary | ICD-10-CM | POA: Diagnosis not present

## 2021-03-14 DIAGNOSIS — I10 Essential (primary) hypertension: Secondary | ICD-10-CM | POA: Diagnosis not present

## 2021-03-14 DIAGNOSIS — G2581 Restless legs syndrome: Secondary | ICD-10-CM | POA: Diagnosis not present

## 2021-03-14 DIAGNOSIS — R42 Dizziness and giddiness: Secondary | ICD-10-CM | POA: Diagnosis not present

## 2021-03-14 DIAGNOSIS — E0842 Diabetes mellitus due to underlying condition with diabetic polyneuropathy: Secondary | ICD-10-CM | POA: Diagnosis not present

## 2021-03-14 DIAGNOSIS — R269 Unspecified abnormalities of gait and mobility: Secondary | ICD-10-CM | POA: Diagnosis not present

## 2021-03-14 DIAGNOSIS — M130 Polyarthritis, unspecified: Secondary | ICD-10-CM | POA: Diagnosis not present

## 2021-03-15 ENCOUNTER — Other Ambulatory Visit: Payer: Medicare Other | Admitting: Family Medicine

## 2021-03-15 ENCOUNTER — Ambulatory Visit (INDEPENDENT_AMBULATORY_CARE_PROVIDER_SITE_OTHER): Payer: Medicare Other

## 2021-03-15 ENCOUNTER — Encounter: Payer: Self-pay | Admitting: Family Medicine

## 2021-03-15 ENCOUNTER — Other Ambulatory Visit: Payer: Self-pay

## 2021-03-15 VITALS — BP 128/60 | HR 64 | Temp 96.8°F | Resp 16 | Wt 126.0 lb

## 2021-03-15 DIAGNOSIS — E1140 Type 2 diabetes mellitus with diabetic neuropathy, unspecified: Secondary | ICD-10-CM | POA: Diagnosis not present

## 2021-03-15 DIAGNOSIS — E1169 Type 2 diabetes mellitus with other specified complication: Secondary | ICD-10-CM

## 2021-03-15 DIAGNOSIS — R634 Abnormal weight loss: Secondary | ICD-10-CM | POA: Diagnosis not present

## 2021-03-15 DIAGNOSIS — R1314 Dysphagia, pharyngoesophageal phase: Secondary | ICD-10-CM | POA: Diagnosis not present

## 2021-03-15 DIAGNOSIS — E785 Hyperlipidemia, unspecified: Secondary | ICD-10-CM

## 2021-03-15 DIAGNOSIS — I482 Chronic atrial fibrillation, unspecified: Secondary | ICD-10-CM | POA: Diagnosis not present

## 2021-03-15 DIAGNOSIS — I10 Essential (primary) hypertension: Secondary | ICD-10-CM

## 2021-03-15 DIAGNOSIS — N1831 Chronic kidney disease, stage 3a: Secondary | ICD-10-CM

## 2021-03-15 DIAGNOSIS — I129 Hypertensive chronic kidney disease with stage 1 through stage 4 chronic kidney disease, or unspecified chronic kidney disease: Secondary | ICD-10-CM | POA: Diagnosis not present

## 2021-03-15 DIAGNOSIS — I480 Paroxysmal atrial fibrillation: Secondary | ICD-10-CM

## 2021-03-15 DIAGNOSIS — I69354 Hemiplegia and hemiparesis following cerebral infarction affecting left non-dominant side: Secondary | ICD-10-CM | POA: Diagnosis not present

## 2021-03-15 DIAGNOSIS — I2510 Atherosclerotic heart disease of native coronary artery without angina pectoris: Secondary | ICD-10-CM

## 2021-03-15 DIAGNOSIS — M6281 Muscle weakness (generalized): Secondary | ICD-10-CM | POA: Diagnosis not present

## 2021-03-15 DIAGNOSIS — F321 Major depressive disorder, single episode, moderate: Secondary | ICD-10-CM

## 2021-03-15 DIAGNOSIS — Z95810 Presence of automatic (implantable) cardiac defibrillator: Secondary | ICD-10-CM

## 2021-03-15 DIAGNOSIS — Z515 Encounter for palliative care: Secondary | ICD-10-CM

## 2021-03-15 DIAGNOSIS — R001 Bradycardia, unspecified: Secondary | ICD-10-CM

## 2021-03-15 DIAGNOSIS — R1319 Other dysphagia: Secondary | ICD-10-CM

## 2021-03-15 NOTE — Chronic Care Management (AMB) (Signed)
Chronic Care Management    Clinical Social Work Note  03/15/2021 Name: [name] MRN: [id number] DOB: January 04, 1932  [name] is a 88 y.o. year old female who is a primary care patient of [name], DO. The CCM team was consulted to assist the patient with chronic disease management and/or care coordination needs related to: [employer] .   Engaged with patient / daughter in law of patient, [name], by telephone for follow up visit in response to provider referral for social work chronic care management and care coordination services.   Consent to Services:  The patient was given information about Chronic Care Management services, agreed to services, and gave verbal consent prior to initiation of services.  Please see initial visit note for detailed documentation.   Patient agreed to services and consent obtained.   Assessment: Review of patient past medical history, allergies, medications, and health status, including review of relevant consultants reports was performed today as part of a comprehensive evaluation and provision of chronic care management and care coordination services.     SDOH (Social Determinants of Health) assessments and interventions performed:  SDOH Interventions    Flowsheet Row Most Recent Value  SDOH Interventions   Physical Activity Interventions Other (Comments)  [walking challenges. she uses a walker to help her walk]  Stress Interventions Other (Comment)  [client has stress related to managing medical needs. client has stress related to managing depression and anxiety issues]  Depression Interventions/Treatment  Medication        Advanced Directives Status: See Vynca application for related entries.  CCM Care Plan  Allergies  Allergen Reactions   Sotalol Other (See Comments)    Torsades    Tramadol Other (See Comments)    Hallucination, bad-dream   Actos [Pioglitazone] Swelling   Adhesive [Tape] Other (See  Comments)    Causes sores   Atorvastatin Other (See Comments)    myalgia   Bactrim [Sulfamethoxazole-Trimethoprim] Itching and Rash    Itchy rash   Ciprofloxacin Nausea Only   Contrast Media [Iodinated Contrast Media] Other (See Comments)    Headache (no action/pre-med required)   Latex Rash   Pedi-Pre Tape Spray [Wound Dressing Adhesive] Other (See Comments)    Causes sores   Sulfa Antibiotics Other (See Comments)    Causes sores    Outpatient Encounter Medications as of 03/15/2021  Medication Sig Note   acetaminophen (TYLENOL) 500 MG tablet Take 1 tablet (500 mg total) by mouth every 6 (six) hours as needed for mild pain, fever or headache.    Alpha-Lipoic Acid 600 MG TABS Take 600 mg by mouth daily. For neuropathy in legs    amiodarone (PACERONE) 200 MG tablet Take 1 tablet (200 mg total) by mouth daily.    amLODipine (NORVASC) 2.5 MG tablet Take 1 tablet (2.5 mg total) by mouth daily. Do NOT take if BP drops below 130 on top or below 60 on bottom.    aspirin EC 81 MG EC tablet Take 1 tablet (81 mg total) by mouth daily. Swallow whole.    busPIRone (BUSPAR) 7.5 MG tablet Take 1 tablet (7.5 mg total) by mouth 2 (two) times daily for 7 days, THEN 1.5 tablets (11.25 mg total) 2 (two) times daily for 7 days, THEN 2 tablets (15 mg total) 2 (two) times daily for 16 days.    carboxymethylcellulose (REFRESH PLUS) 0.5 % SOLN Place 1 drop into both eyes 3 (three) times daily as needed (dry/irritated eyes.).  Cholecalciferol (VITAMIN D) 50 MCG (2000 UT) CAPS Take 2,000 Units by mouth every morning.    ELIQUIS 2.5 MG TABS tablet TAKE (1) TABLET TWICE A DAY.    feeding supplement (ENSURE ENLIVE / ENSURE PLUS) LIQD Take 237 mLs by mouth 2 (two) times daily between meals.    furosemide (LASIX) 20 MG tablet Take 20 mg by mouth 3 (three) times a week.    gabapentin (NEURONTIN) 100 MG capsule Take 1-3 capsules (100-300 mg total) by mouth at bedtime as needed.    isosorbide mononitrate (IMDUR) 60 MG  24 hr tablet Take 1 tablet (60 mg total) by mouth daily.    levothyroxine (SYNTHROID) 100 MCG tablet Take 1 tablet (100 mcg total) by mouth every morning.    MELATONIN PO Take 3 mg by mouth at bedtime.    metoprolol succinate (TOPROL-XL) 25 MG 24 hr tablet Take 0.5 tablets (12.5 mg total) by mouth daily. Take with or immediately following a meal. (Patient taking differently: Take 12.5 mg by mouth 2 (two) times daily. Take with or immediately following a meal.)    Multiple Vitamins-Minerals (CENTRUM SILVER ULTRA WOMENS PO) Take 1 tablet by mouth every morning.    nitroGLYCERIN (NITROSTAT) 0.4 MG SL tablet PLACE 1 TABLET UNDER THE TONGUE AT ONSET OF CHEST PAIN EVERY 5 MINTUES UP TO 3 TIMES AS NEEDED (Patient taking differently: Place 0.4 mg under the tongue every 5 (five) minutes as needed.) 01/31/2021: Patient has if needed   pantoprazole (PROTONIX) 40 MG tablet TAKE 1 TABLET ONCE DAILY    potassium chloride SA (KLOR-CON) 20 MEQ tablet Take 20 mEq by mouth 3 (three) times a week.    rosuvastatin (CRESTOR) 10 MG tablet TAKE 1 TABLET DAILY    No facility-administered encounter medications on file as of 03/15/2021.    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[place]) 05/12/2020   Intractable abdominal pain 03/28/2020   Lactic acidosis 03/28/2020   Elevated troponin I level 03/28/2020   Malnutrition of moderate degree 12/26/2019   Prolonged QT interval 12/23/2019   Acute lower UTI 12/23/2019   Constipation 11/27/2019   Diarrhea 11/27/2019   Lesion of pancreas 11/26/2019   Fibromuscular dysplasia of right renal artery ([place]) 11/26/2019   Left renal artery stenosis ([place]) 11/26/2019   RUQ pain    Abnormal CT of the abdomen    Vertigo 11/15/2019   Advanced care planning/counseling discussion 11/15/2019    CAD (coronary artery disease)    Hypothyroidism    Hyponatremia    Volume depletion    TSH elevation 08/01/2019   Hospital discharge follow-up 08/01/2019   Exudative age-related macular degeneration of right eye with inactive choroidal neovascularization ([place]) 04/30/2019   Retinal hemorrhage of right eye 04/30/2019   Advanced nonexudative age-related macular degeneration of left eye with subfoveal involvement 04/30/2019   Exudative age-related macular degeneration of left eye with inactive choroidal neovascularization ([place]) 04/30/2019   Advanced nonexudative age-related macular degeneration of right eye with subfoveal involvement 04/30/2019   Posterior vitreous detachment of both eyes 04/30/2019   Vitreous degeneration, bilateral 04/30/2019   Secondary hypercoagulable state ([place]) 03/11/2019   Persistent atrial fibrillation ([place])    Proctitis 01/09/2019   Atrial fibrillation, chronic ([place]) 01/09/2019   Hyperlipidemia 01/09/2019   Nausea 01/09/2019   Hyperglycemia 01/09/2019  COVID-19 virus infection 12/28/2018   Chronic diarrhea 09/18/2018   Abdominal pain 09/18/2018   Osteoarthritis of right knee 03/29/2018   GERD (gastroesophageal reflux disease)    Dehydration 02/06/2018   Hypokalemia 02/06/2018   Diarrhea of presumed infectious origin 02/06/2018   Acute kidney injury superimposed on CKD ([place]) 02/05/2018   Essential hypertension 12/21/2017   Degenerative scoliosis 02/19/2017   Burst fracture of lumbar vertebra ([place]) 02/19/2017   History of drug-induced prolonged QT interval with torsade de pointes 11/01/2016   Iron deficiency anemia 10/30/2016   Long term current use of anticoagulant 02/17/2016   Near syncope 05/22/2015   Diabetes mellitus type 2, diet-controlled ([place]) 09/14/2014   ICD (implantable cardioverter-defibrillator) battery depletion 01/20/2014   Abnormal nuclear stress test, 12/30/13 12/31/2013   Coronary artery disease involving native coronary artery of  native heart without angina pectoris 12/31/2013   Paroxysmal atrial fibrillation, chads2 Vasc2 score of 5, on eliquis 10/16/2013   Catecholaminergic polymorphic ventricular tachycardia 10/16/2013   Ureteral stone with hydronephrosis 25/42/7062   Metabolic syndrome 37/62/8315   Orthostatic hypotension 07/27/2011   Chest pain 07/25/2011   Weakness 07/25/2011    Class: Acute   Chronic sinus bradycardia 07/25/2011   ICD (implantable cardioverter-defibrillator) in place 07/25/2011   CKD (chronic kidney disease), stage III ([place]) 07/25/2011    Class: Chronic   History of DVT in the past, on eliquis now 07/25/2011    Class: History of   Nephrolithiasis, just saw Dr [name] 07/25/2011    Class: History of   DYSPHAGIA 03/24/2010   Chronic ischemic heart disease 12/03/2007   Irritable bowel syndrome with diarrhea 12/03/2007   EPIGASTRIC PAIN 12/03/2007   Hyperlipidemia associated with type 2 diabetes mellitus ([place]) 12/02/2007    Class: History of   EXTERNAL HEMORRHOIDS 12/02/2007    Class: History of   ESOPHAGITIS 12/02/2007   GASTRITIS 12/02/2007   DIVERTICULOSIS, COLON 12/02/2007   ARTHRITIS 12/02/2007    Class: Chronic    Conditions to be addressed/monitored: monitor client management of anxiety and depression issues  Care Plan : LCSW care plan  Updates made by Katha Cabal, LCSW since 03/15/2021 12:00 AM     Problem: Emotional Distress      Goal: Emotional Health Supported; Manage depression symptoms and manage anxiety symptoms   Start Date: 03/15/2021  Expected End Date: 06/09/2021  This Visit's Progress: On track  Recent Progress: On track  Priority: Medium  Note:   Current Barriers:  Chronic Mental Health needs related to depression and anxiety Mobility issues (uses a walker to help her walk) Suicidal Ideation/Homicidal Ideation: No Pain issue in right knee Vision needs  Hearing deficit  Clinical Social Work Goal(s):  patient will work with SW monthly by  telephone or in person to reduce or manage symptoms related to depression and anxiety Patient will work with SW monthly by telephone or in person to discuss mobiltiy of client and pain issues of client Client or son/daughter of client will call RNCM as needed in next 30 days to discuss nursing needs of client Client will communicate with SW in next 30 days to discuss hearing needs of client  Interventions:  1:1 collaboration with [name], DO regarding development and update of comprehensive plan of care as evidenced by provider attestation and co-signature Discussed client needs with [name], daughter in law of client Discussed family support for client. Client has support from her children. She has 3 sons and one daughter.  She said that her children  are supportive. Client also has support from [name], daughter in law Reviewed with [name] in home care support for client. CAP program aide.has been helping client Monday through Friday each week for 8 hours per day.  New CAP aide is scheduled to begin helping client in the home this week Reviewed mood status of client. Client feels that her medication , Buspar, is helping her at this time. [name] said client recently went with family for an outing to see other family members. [name] said client has been more social and [name] also feels that Buspar is helping client. Discussed client support from cardiologist.  LCSW encouraged [name] or son of client to call Triage Nurse at [facility] today since [name] had questions about next time client should see cardiologist. [name] plans to talk with Triage Nurse today to discuss concerns related to heart care for client Provided counseling support to [name] relate to needs of client Discussed client appetite. [name] said she thinks client appetite has improved slightly.  Discussed client ambulation. Client uses a walker to help her walk,  Encouraged client or family representative to call RNCM as needed  to discuss nursing needs of client.  Patient Self Care Activities:  Attends scheduled medical appointments with help of home health aide Allows time for rest and relaxation Takes time for hobby of making floral arrangements  Patient Coping Strengths:  Supportive Relationships Family Friends  Patient Self Care Deficits:  Mobility issues Depression and anxiety challenges Pain issues in her right knee  Patient Goals:  - spend time or talk with others at least 2 to 3 times per week - practice relaxation or meditation daily - keep a calendar with appointment dates  Follow Up Plan: LCSW to call client or family representative on 05/05/21 at 3:00 PM to assess client needs.      Norva Riffle.Denia Mcvicar MSW, [place] [facility] representative [facility] Care Management [phone]

## 2021-03-15 NOTE — Patient Instructions (Addendum)
Visit Information  Patient goals:  Manage Emotions. Manage Anxiety  issues faced.Manage Depression issues faced.   Timeframe:  Short-Term Goal Priority:  Medium Progress: On Track Start Date:        03/15/21                Expected End Date:       06/06/21            Follow Up Date  05/05/21 at 3:00 PM   Manage Emotions; Manage anxiety issues faced; Manage depression issues faced    Why is this important?   When you are stressed, down or upset, your body reacts too.  For example, your blood pressure may get higher; you may have a headache or stomachache.  When your emotions get the best of you, your body's ability to fight off cold and flu gets weak.  These steps will help you manage your emotions.     Patient Self Care Activities:  Self administers medications as prescribed Attends all scheduled provider appointments Performs ADL's independently  Patient Coping Strengths:  Supportive Relationships Family Friends  Patient Self Care Deficits:  Mobility issues Depression and anxiety challenges Pain issues in her right knee  Patient Goals:  - spend time or talk with others at least 2 to 3 times per week - practice relaxation or meditation daily - keep a calendar with appointment dates  Follow Up Plan: LCSW to call client or family representative on 05/05/21 at 3:00 PM to assess client needs   Norva Riffle.Zyaire Mccleod MSW, [place] [facility] representative [facility] Care Management [phone]

## 2021-03-15 NOTE — Progress Notes (Signed)
[facility] Palliative Care Consult Note Telephone: [phone]  Fax: [phone]    Date of encounter: 03/15/21 2:20 PM PATIENT NAME: [name] [id number]   [phone] (home)  DOB: [birth year] MRN: [id number] PRIMARY CARE PROVIDER:    [name], DO,  [place] [address] [phone]  REFERRING PROVIDER:   [name], DO [address] [place],  [place] [address] [phone]  RESPONSIBLE PARTY:    Contact Information     Name Relation Home Work [place] Son   [phone]   [facility] Daughter [phone]     [place], [place] [phone]  [place] [phone]  [phone]   MARTIN,DONNIE Relative   [phone]   [place], [place] Relative   [phone]        I met face to face with patient and family in her home. Palliative Care was asked to follow this patient by consultation request of  [name], DO to address advance care planning and complex medical decision making. This is a follow up visit.                                   ASSESSMENT, SYMPTOM MANAGEMENT AND PLAN / RECOMMENDATIONS:   Palliative Care Encounter Continue to follow for advance directive and goals of care  2.    Abnormal weight loss Encouraged to dilute Ensure to improve tolerance. Try other high protein supplements  Advance Care Planning/Goals of Care: Goals include to maximize quality of life and symptom management.  Our advance care planning conversation included a discussion about:    The value and importance of advance care planning  Decision not to resuscitate or to de-escalate disease focused treatments due to poor prognosis. CODE STATUS: DNR    Follow up Palliative Care Visit: Palliative care will continue to follow for complex medical decision making, advance care planning, and clarification of goals. Return 4 weeks  or prn.    This visit was coded based on medical decision making (MDM).  PPS: 40%  HOSPICE ELIGIBILITY/DIAGNOSIS: TBD  Chief Complaint:  Hospital follow up of recent CVA symptoms  HISTORY OF PRESENT ILLNESS:  [name] is a 88 y.o. year old female with HTN, PVD, afib, AAA on Eliquis, chronic ischemic heart disease, FMD and left renal artery stenosis, esophageal stricture, dysphagia , DM and hypothyroidism, CKD, implanted AICD.  She was recently hospitalized for TIA, could not have MRI due to AICD and negative head CT. She is on Eliquis and ASA.  CTA head and neck with small vessel disease. Echo with EF 50-55%.  Went to rehab.  CVA was suspected by Neurology as pt woke up with left sided weakness, facial droop an inability to stand. She was recommended to start Crestor. Has no residual symptoms. Reports weight loss of 85 lbs over 2 years and appetite is "not good".  Does not tolerate Ensure very well as it upsets her stomach. She has care from [facility].  Denies falls.  Has intermittent nausea, denies dysuria and fever.  Has increased SOB and feelings of weakness today with dizziness when turning head to left. Pt c/o burning pain in her legs affecting her sleep and Gabapentin dose recently doubled.  Pt's son and his wife take care of her medicines.  Mood is "fair".  She requires assistance with bathing and  is incontinent of bowel and bladder.  Has IBS loose stools 3/day.    History obtained from review of EMR, interview with family and/or [name].  I reviewed available labs, medications, imaging, studies and related documents from the EMR.  Records reviewed and summarized above.   ROS General: NAD EYES: has dry macular degeneration and is to follow up with Dr Katy Fitch ENMT: denies dysphagia Cardiovascular: denies chest pain, reports DOE Pulmonary: denies cough, reports increased SOB Abdomen: endorses poor appetite, denies constipation, endorses incontinence of bowel GU: denies dysuria,  endorses incontinence of urine MSK:  reports increased weakness, no falls reported Skin: denies rashes or wounds Neurological: denies insomnia, neurologic burning pain in legs Psych: Endorses fair mood Heme/lymph/immuno: denies bruises, abnormal bleeding  Physical Exam: Current and past weights: Current weight 126 lbs on home scale which is stable since 10/05/20 when weight was 120 lbs 12.8 ounces. Constitutional: NAD General: frail appearing, thin EYES: anicteric sclera, lids intact, no discharge  ENMT: intact hearing, oral mucous membranes moist, dentition intact CV: S1S2, IRIR, no LE edema Pulmonary: CTAB, no increased work of breathing, no cough, room air Abdomen: normo-active BS + 4 quadrants, soft and non tender, no ascites GU: deferred MSK: moves all extremities, ambulatory Skin: warm and dry, no rashes or wounds on visible skin Neuro:  no generalized weakness,  no cognitive impairment Psych: non-anxious affect, A and O x 3 Hem/lymph/immuno: no widespread bruising   Thank you for the opportunity to participate in the care of [name].  The palliative care team will continue to follow. Please call our office at [phone] if we can be of additional assistance.   Marijo Conception, FNP-C  COVID-19 PATIENT SCREENING TOOL Asked and negative response unless otherwise noted:   Have you had symptoms of covid, tested positive or been in contact with someone with symptoms/positive test in the past 5-10 days?  No

## 2021-03-16 DIAGNOSIS — E1140 Type 2 diabetes mellitus with diabetic neuropathy, unspecified: Secondary | ICD-10-CM | POA: Diagnosis not present

## 2021-03-16 DIAGNOSIS — I482 Chronic atrial fibrillation, unspecified: Secondary | ICD-10-CM | POA: Diagnosis not present

## 2021-03-16 DIAGNOSIS — M6281 Muscle weakness (generalized): Secondary | ICD-10-CM | POA: Diagnosis not present

## 2021-03-16 DIAGNOSIS — I69354 Hemiplegia and hemiparesis following cerebral infarction affecting left non-dominant side: Secondary | ICD-10-CM | POA: Diagnosis not present

## 2021-03-16 DIAGNOSIS — R1314 Dysphagia, pharyngoesophageal phase: Secondary | ICD-10-CM | POA: Diagnosis not present

## 2021-03-16 DIAGNOSIS — I129 Hypertensive chronic kidney disease with stage 1 through stage 4 chronic kidney disease, or unspecified chronic kidney disease: Secondary | ICD-10-CM | POA: Diagnosis not present

## 2021-03-18 DIAGNOSIS — I129 Hypertensive chronic kidney disease with stage 1 through stage 4 chronic kidney disease, or unspecified chronic kidney disease: Secondary | ICD-10-CM | POA: Diagnosis not present

## 2021-03-18 DIAGNOSIS — R1314 Dysphagia, pharyngoesophageal phase: Secondary | ICD-10-CM | POA: Diagnosis not present

## 2021-03-18 DIAGNOSIS — M6281 Muscle weakness (generalized): Secondary | ICD-10-CM | POA: Diagnosis not present

## 2021-03-18 DIAGNOSIS — E1140 Type 2 diabetes mellitus with diabetic neuropathy, unspecified: Secondary | ICD-10-CM | POA: Diagnosis not present

## 2021-03-18 DIAGNOSIS — I69354 Hemiplegia and hemiparesis following cerebral infarction affecting left non-dominant side: Secondary | ICD-10-CM | POA: Diagnosis not present

## 2021-03-18 DIAGNOSIS — I482 Chronic atrial fibrillation, unspecified: Secondary | ICD-10-CM | POA: Diagnosis not present

## 2021-03-21 ENCOUNTER — Encounter: Payer: Self-pay | Admitting: Cardiovascular Disease

## 2021-03-21 DIAGNOSIS — Z515 Encounter for palliative care: Secondary | ICD-10-CM | POA: Insufficient documentation

## 2021-03-21 DIAGNOSIS — Z20828 Contact with and (suspected) exposure to other viral communicable diseases: Secondary | ICD-10-CM | POA: Diagnosis not present

## 2021-03-21 DIAGNOSIS — R634 Abnormal weight loss: Secondary | ICD-10-CM | POA: Insufficient documentation

## 2021-03-21 NOTE — Telephone Encounter (Signed)
Please continue taking it daily until she has her appointment with me in April. ?

## 2021-03-23 DIAGNOSIS — I5032 Chronic diastolic (congestive) heart failure: Secondary | ICD-10-CM | POA: Diagnosis not present

## 2021-03-23 DIAGNOSIS — I129 Hypertensive chronic kidney disease with stage 1 through stage 4 chronic kidney disease, or unspecified chronic kidney disease: Secondary | ICD-10-CM | POA: Diagnosis not present

## 2021-03-23 DIAGNOSIS — R809 Proteinuria, unspecified: Secondary | ICD-10-CM | POA: Diagnosis not present

## 2021-03-23 DIAGNOSIS — N1832 Chronic kidney disease, stage 3b: Secondary | ICD-10-CM | POA: Diagnosis not present

## 2021-03-25 DIAGNOSIS — Z20822 Contact with and (suspected) exposure to covid-19: Secondary | ICD-10-CM | POA: Diagnosis not present

## 2021-03-27 DIAGNOSIS — Z7901 Long term (current) use of anticoagulants: Secondary | ICD-10-CM | POA: Diagnosis not present

## 2021-03-27 DIAGNOSIS — Z7982 Long term (current) use of aspirin: Secondary | ICD-10-CM | POA: Diagnosis not present

## 2021-03-27 DIAGNOSIS — E1140 Type 2 diabetes mellitus with diabetic neuropathy, unspecified: Secondary | ICD-10-CM | POA: Diagnosis not present

## 2021-03-27 DIAGNOSIS — I482 Chronic atrial fibrillation, unspecified: Secondary | ICD-10-CM | POA: Diagnosis not present

## 2021-03-27 DIAGNOSIS — I69354 Hemiplegia and hemiparesis following cerebral infarction affecting left non-dominant side: Secondary | ICD-10-CM | POA: Diagnosis not present

## 2021-03-27 DIAGNOSIS — Z95810 Presence of automatic (implantable) cardiac defibrillator: Secondary | ICD-10-CM | POA: Diagnosis not present

## 2021-03-27 DIAGNOSIS — E1122 Type 2 diabetes mellitus with diabetic chronic kidney disease: Secondary | ICD-10-CM | POA: Diagnosis not present

## 2021-03-27 DIAGNOSIS — K5900 Constipation, unspecified: Secondary | ICD-10-CM | POA: Diagnosis not present

## 2021-03-27 DIAGNOSIS — F419 Anxiety disorder, unspecified: Secondary | ICD-10-CM | POA: Diagnosis not present

## 2021-03-27 DIAGNOSIS — E785 Hyperlipidemia, unspecified: Secondary | ICD-10-CM | POA: Diagnosis not present

## 2021-03-27 DIAGNOSIS — M6281 Muscle weakness (generalized): Secondary | ICD-10-CM | POA: Diagnosis not present

## 2021-03-27 DIAGNOSIS — I2510 Atherosclerotic heart disease of native coronary artery without angina pectoris: Secondary | ICD-10-CM | POA: Diagnosis not present

## 2021-03-27 DIAGNOSIS — K219 Gastro-esophageal reflux disease without esophagitis: Secondary | ICD-10-CM | POA: Diagnosis not present

## 2021-03-27 DIAGNOSIS — R42 Dizziness and giddiness: Secondary | ICD-10-CM | POA: Diagnosis not present

## 2021-03-27 DIAGNOSIS — E039 Hypothyroidism, unspecified: Secondary | ICD-10-CM | POA: Diagnosis not present

## 2021-03-27 DIAGNOSIS — N1831 Chronic kidney disease, stage 3a: Secondary | ICD-10-CM | POA: Diagnosis not present

## 2021-03-27 DIAGNOSIS — R1314 Dysphagia, pharyngoesophageal phase: Secondary | ICD-10-CM | POA: Diagnosis not present

## 2021-03-27 DIAGNOSIS — I129 Hypertensive chronic kidney disease with stage 1 through stage 4 chronic kidney disease, or unspecified chronic kidney disease: Secondary | ICD-10-CM | POA: Diagnosis not present

## 2021-03-28 DIAGNOSIS — E1140 Type 2 diabetes mellitus with diabetic neuropathy, unspecified: Secondary | ICD-10-CM | POA: Diagnosis not present

## 2021-03-28 DIAGNOSIS — I69354 Hemiplegia and hemiparesis following cerebral infarction affecting left non-dominant side: Secondary | ICD-10-CM | POA: Diagnosis not present

## 2021-03-28 DIAGNOSIS — I129 Hypertensive chronic kidney disease with stage 1 through stage 4 chronic kidney disease, or unspecified chronic kidney disease: Secondary | ICD-10-CM | POA: Diagnosis not present

## 2021-03-28 DIAGNOSIS — I482 Chronic atrial fibrillation, unspecified: Secondary | ICD-10-CM | POA: Diagnosis not present

## 2021-03-28 DIAGNOSIS — R1314 Dysphagia, pharyngoesophageal phase: Secondary | ICD-10-CM | POA: Diagnosis not present

## 2021-03-28 DIAGNOSIS — M6281 Muscle weakness (generalized): Secondary | ICD-10-CM | POA: Diagnosis not present

## 2021-03-29 ENCOUNTER — Ambulatory Visit: Payer: Medicare Other | Admitting: Family Medicine

## 2021-03-29 ENCOUNTER — Other Ambulatory Visit: Payer: Self-pay | Admitting: Family Medicine

## 2021-03-29 DIAGNOSIS — F321 Major depressive disorder, single episode, moderate: Secondary | ICD-10-CM

## 2021-03-29 DIAGNOSIS — F411 Generalized anxiety disorder: Secondary | ICD-10-CM

## 2021-03-29 DIAGNOSIS — R630 Anorexia: Secondary | ICD-10-CM

## 2021-03-30 DIAGNOSIS — R1314 Dysphagia, pharyngoesophageal phase: Secondary | ICD-10-CM | POA: Diagnosis not present

## 2021-03-30 DIAGNOSIS — E1140 Type 2 diabetes mellitus with diabetic neuropathy, unspecified: Secondary | ICD-10-CM | POA: Diagnosis not present

## 2021-03-30 DIAGNOSIS — I69354 Hemiplegia and hemiparesis following cerebral infarction affecting left non-dominant side: Secondary | ICD-10-CM | POA: Diagnosis not present

## 2021-03-30 DIAGNOSIS — I129 Hypertensive chronic kidney disease with stage 1 through stage 4 chronic kidney disease, or unspecified chronic kidney disease: Secondary | ICD-10-CM | POA: Diagnosis not present

## 2021-03-30 DIAGNOSIS — I482 Chronic atrial fibrillation, unspecified: Secondary | ICD-10-CM | POA: Diagnosis not present

## 2021-03-30 DIAGNOSIS — M6281 Muscle weakness (generalized): Secondary | ICD-10-CM | POA: Diagnosis not present

## 2021-03-30 MED ORDER — BUSPIRONE HCL 15 MG PO TABS: 15.0000 mg | ORAL_TABLET | Freq: Two times a day (BID) | ORAL | 0 refills | Status: DC

## 2021-03-31 DIAGNOSIS — I482 Chronic atrial fibrillation, unspecified: Secondary | ICD-10-CM | POA: Diagnosis not present

## 2021-03-31 DIAGNOSIS — M6281 Muscle weakness (generalized): Secondary | ICD-10-CM | POA: Diagnosis not present

## 2021-03-31 DIAGNOSIS — I69354 Hemiplegia and hemiparesis following cerebral infarction affecting left non-dominant side: Secondary | ICD-10-CM | POA: Diagnosis not present

## 2021-03-31 DIAGNOSIS — R1314 Dysphagia, pharyngoesophageal phase: Secondary | ICD-10-CM | POA: Diagnosis not present

## 2021-03-31 DIAGNOSIS — E1140 Type 2 diabetes mellitus with diabetic neuropathy, unspecified: Secondary | ICD-10-CM | POA: Diagnosis not present

## 2021-03-31 DIAGNOSIS — I129 Hypertensive chronic kidney disease with stage 1 through stage 4 chronic kidney disease, or unspecified chronic kidney disease: Secondary | ICD-10-CM | POA: Diagnosis not present

## 2021-04-04 ENCOUNTER — Encounter: Payer: Self-pay | Admitting: Family Medicine

## 2021-04-04 ENCOUNTER — Ambulatory Visit: Payer: Medicare Other | Admitting: Gastroenterology

## 2021-04-04 ENCOUNTER — Ambulatory Visit (INDEPENDENT_AMBULATORY_CARE_PROVIDER_SITE_OTHER): Payer: Medicare Other | Admitting: Family Medicine

## 2021-04-04 VITALS — BP 108/55 | HR 52 | Temp 96.6°F | Resp 20 | Ht 63.0 in | Wt 124.0 lb

## 2021-04-04 DIAGNOSIS — G8929 Other chronic pain: Secondary | ICD-10-CM | POA: Diagnosis not present

## 2021-04-04 DIAGNOSIS — N1832 Chronic kidney disease, stage 3b: Secondary | ICD-10-CM

## 2021-04-04 DIAGNOSIS — I129 Hypertensive chronic kidney disease with stage 1 through stage 4 chronic kidney disease, or unspecified chronic kidney disease: Secondary | ICD-10-CM

## 2021-04-04 DIAGNOSIS — R809 Proteinuria, unspecified: Secondary | ICD-10-CM | POA: Diagnosis not present

## 2021-04-04 DIAGNOSIS — I5032 Chronic diastolic (congestive) heart failure: Secondary | ICD-10-CM | POA: Diagnosis not present

## 2021-04-04 DIAGNOSIS — M25561 Pain in right knee: Secondary | ICD-10-CM | POA: Diagnosis not present

## 2021-04-04 DIAGNOSIS — F411 Generalized anxiety disorder: Secondary | ICD-10-CM

## 2021-04-04 DIAGNOSIS — F321 Major depressive disorder, single episode, moderate: Secondary | ICD-10-CM

## 2021-04-04 MED ORDER — METHYLPREDNISOLONE ACETATE 40 MG/ML IJ SUSP
40.0000 mg | Freq: Once | INTRAMUSCULAR | Status: AC
  Administered 2021-04-04: 40 mg via INTRA_ARTICULAR

## 2021-04-04 NOTE — Progress Notes (Signed)
? ?Subjective: ?CC:GAD/ depression ?PCP: [name], DO ?YBO:FBPZWCH [name] is a 88 y.o. female presenting to clinic today for: ? ?1. GAD ?Buspar dose advanced last visit.  She notes that things are going much better on the higher dose.  She is currently taking 15 mg twice daily.  No complications with the medications. ? ?2.  Hypertension, CKD 3b ?Had labs performed recently by her nephrologist.  She was started on valsartan 20 milligrams daily.  She is continued on all other medications including the amlodipine.  However, she has been measuring blood pressures at home and they have been running 852D systolic fairly regularly.  She admits that she does not take her blood pressure medicine until after she takes her blood pressure each day.  No chest pain, shortness of breath.  She does feel slightly lightheaded today. ? ?3.  Chronic right knee pain ?Patient with known osteoarthritis, bone-on-bone, of the right knee.  We have been treating her with corticosteroid injections and she would like to get a injection done today as her knee has been bothering her more.  Last injection performed 11/05/21 ? ? ?ROS: Per HPI ? ?Allergies  ?Allergen Reactions  ? Sotalol Other (See Comments)  ?  Torsades ?  ? Tramadol Other (See Comments)  ?  Hallucination, bad-dream  ? Actos [Pioglitazone] Swelling  ? Adhesive [Tape] Other (See Comments)  ?  Causes sores  ? Atorvastatin Other (See Comments)  ?  myalgia  ? Bactrim [Sulfamethoxazole-Trimethoprim] Itching and Rash  ?  Itchy rash  ? Ciprofloxacin Nausea Only  ? Contrast Media [Iodinated Contrast Media] Other (See Comments)  ?  Headache (no action/pre-med required)  ? Latex Rash  ? Pedi-Pre Tape Spray [Wound Dressing Adhesive] Other (See Comments)  ?  Causes sores  ? Sulfa Antibiotics Other (See Comments)  ?  Causes sores  ? ?Past Medical History:  ?Diagnosis Date  ? AICD (automatic cardioverter/defibrillator) present   ? Allergy   ? SESONAL  ? Aneurysm of infrarenal  abdominal aorta   ? Anxiety   ? ARTHRITIS   ? Arthritis   ? Atrial fibrillation ([place])   ? CAD (coronary artery disease)   ? a. history of cardiac arrest 1993;  b. s/p LAD/LCX stenting in 2003;  c. 07/2011 Cath: LM nl, LAD patent stent, D1 80ost, LCX patent stent, RCA min irregs;  d. 04/2012 MV: EF 66%, no ishcemia;  e. 12/2013 Echo: Ef 55%, no rwma, Gr 1 DD, triv AI/MR, mildly dil LA;  f. 12/2013 Lexi MV: intermediate risk - apical ischemia and inf/infsept fixed defect, ? artifactual.  ? CAD in native artery 12/31/2013  ? Previous stents to LAD and Ramus patent on cath today 12/31/13 also There is severe disease in the ostial first diagonal which is unchanged from most recent cardiac catheterization. The right coronary artery could not be engaged selectively but nonselective angiography showed no significant disease in the proximal and midsegment.      ? Cataract   ? DENIES  ? Chronic back pain   ? Chronic sinus bradycardia   ? CKD (chronic kidney disease), stage III ([place])   ? Clotting disorder ([place])   ? DVT  ? COLITIS 12/02/2007  ? Qualifier: Diagnosis of  By: [name], [name]    ? Diabetes mellitus without complication ([place])   ? DENIES  ? Diverticulosis of colon (without mention of hemorrhage) 2007  ? Colonoscopy   ? Esophageal stricture   ? a. 2012 s/p dil.  ?  Esophagitis, unspecified   ? a. 2012 EGD  ? EXTERNAL HEMORRHOIDS   ? GERD (gastroesophageal reflux disease)   ? omeprazole  ? H/O hiatal hernia   ? Hiatal hernia   ? a. 2012 EGD.  ? History of DVT in the past, not on Coumadin now   ? left  leg  ? HYPERCHOLESTEROLEMIA   ? Hypertension   ? ICD (implantable cardiac defibrillator) in place   ? a. s/p initial ICD in 1993 in setting of cardiac arrest;  b. 01/2007 gen change: Guidant T135 Vitality DS VR single lead ICD.  ? Macular degeneration   ? gets injecton in eye every 5 weeks- last injection - 05/03/2013   ? Myocardial infarction [facility]) 1993  ? Near syncope 05/22/2015  ? Nephrolithiasis, just saw Dr  [name]   ? Orthostatic hypotension   ? Peripheral vascular disease ([place])   ? ???  ? RECTAL BLEEDING 12/03/2007  ? Qualifier: Diagnosis of  By: Sharlett Iles MD Byrd Hesselbach   ? Unspecified gastritis and gastroduodenitis without mention of hemorrhage   ? a. 2003 EGD->not noted on 2012 EGD.  ? Unstable angina, neg MI, cath stable.maybe GI 12/30/2013  ? ? ?Current Outpatient Medications:  ?  acetaminophen (TYLENOL) 500 MG tablet, Take 1 tablet (500 mg total) by mouth every 6 (six) hours as needed for mild pain, fever or headache., Disp: 30 tablet, Rfl: 0 ?  Alpha-Lipoic Acid 600 MG TABS, Take 600 mg by mouth daily. For neuropathy in legs, Disp: 90 tablet, Rfl: 3 ?  amiodarone (PACERONE) 200 MG tablet, Take 1 tablet (200 mg total) by mouth daily., Disp: 90 tablet, Rfl: 3 ?  amLODipine (NORVASC) 2.5 MG tablet, Take 1 tablet (2.5 mg total) by mouth daily. Do NOT take if BP drops below 130 on top or below 60 on bottom., Disp: 90 tablet, Rfl: 3 ?  aspirin EC 81 MG EC tablet, Take 1 tablet (81 mg total) by mouth daily. Swallow whole., Disp: 30 tablet, Rfl: 11 ?  busPIRone (BUSPAR) 15 MG tablet, Take 1 tablet (15 mg total) by mouth 2 (two) times daily., Disp: 180 tablet, Rfl: 0 ?  carboxymethylcellulose (REFRESH PLUS) 0.5 % SOLN, Place 1 drop into both eyes 3 (three) times daily as needed (dry/irritated eyes.)., Disp: , Rfl:  ?  Cholecalciferol (VITAMIN D) 50 MCG (2000 UT) CAPS, Take 2,000 Units by mouth every morning., Disp: , Rfl:  ?  ELIQUIS 2.5 MG TABS tablet, TAKE (1) TABLET TWICE A DAY., Disp: 180 tablet, Rfl: 1 ?  feeding supplement (ENSURE ENLIVE / ENSURE PLUS) LIQD, Take 237 mLs by mouth 2 (two) times daily between meals., Disp: , Rfl:  ?  furosemide (LASIX) 20 MG tablet, Take 20 mg by mouth 3 (three) times a week., Disp: , Rfl:  ?  gabapentin (NEURONTIN) 100 MG capsule, Take 1-3 capsules (100-300 mg total) by mouth at bedtime as needed., Disp: 90 capsule, Rfl: 3 ?  isosorbide mononitrate (IMDUR) 60 MG 24 hr  tablet, Take 1 tablet (60 mg total) by mouth daily., Disp: 30 tablet, Rfl: 11 ?  levothyroxine (SYNTHROID) 100 MCG tablet, Take 1 tablet (100 mcg total) by mouth every morning., Disp: 90 tablet, Rfl: 1 ?  MELATONIN PO, Take 3 mg by mouth at bedtime., Disp: , Rfl:  ?  metoprolol succinate (TOPROL-XL) 25 MG 24 hr tablet, Take 0.5 tablets (12.5 mg total) by mouth daily. Take with or immediately following a meal. (Patient taking differently: Take 12.5 mg by mouth  2 (two) times daily. Take with or immediately following a meal.), Disp: 15 tablet, Rfl: 11 ?  Multiple Vitamins-Minerals (CENTRUM SILVER ULTRA WOMENS PO), Take 1 tablet by mouth every morning., Disp: , Rfl:  ?  nitroGLYCERIN (NITROSTAT) 0.4 MG SL tablet, PLACE 1 TABLET UNDER THE TONGUE AT ONSET OF CHEST PAIN EVERY 5 MINTUES UP TO 3 TIMES AS NEEDED (Patient taking differently: Place 0.4 mg under the tongue every 5 (five) minutes as needed.), Disp: 25 tablet, Rfl: 2 ?  pantoprazole (PROTONIX) 40 MG tablet, TAKE 1 TABLET ONCE DAILY, Disp: 90 tablet, Rfl: 0 ?  potassium chloride SA (KLOR-CON) 20 MEQ tablet, Take 20 mEq by mouth 3 (three) times a week., Disp: , Rfl:  ?  rosuvastatin (CRESTOR) 10 MG tablet, TAKE 1 TABLET DAILY, Disp: 90 tablet, Rfl: 0 ?Social History  ? ?Socioeconomic History  ? Marital status: Widowed  ?  Spouse name: Not on file  ? Number of children: 4  ? Years of education: 8  ? Highest education level: 8th grade  ?Occupational History  ? Occupation: Retired  ?Tobacco Use  ? Smoking status: Never  ? Smokeless tobacco: Never  ?Vaping Use  ? Vaping Use: Never used  ?Substance and Sexual Activity  ? Alcohol use: No  ? Drug use: No  ? Sexual activity: Never  ?  Birth control/protection: Post-menopausal  ?Other Topics Concern  ? Not on file  ?Social History Narrative  ? Lives alone - son visits Sundays, daughter visits Saturday  ? Has an aide Mortimer Fries [place]) 5 days per week  ? ?Social Determinants of Health  ? ?Financial Resource Strain: Low Risk    ? Difficulty of Paying Living Expenses: Not hard at all  ?Food Insecurity: No Food Insecurity  ? Worried About Charity fundraiser in the Last Year: Never true  ? Ran Out of Food in the Last Year: Never true  ?Tra

## 2021-04-04 NOTE — Patient Instructions (Signed)
HOLD amlodipine for now. ?Continue the losartan as prescribed by the kidney doctor. ?Monitor blood pressures at least 2 hours AFTER she takes meds (lunch time might be a good time).   ?Goal blood pressure is less than 150/90 but ABOVE 110/60 ?If blood pressures are above 150/90 even AFTER taking her other blood pressure medications, ok to restart the Amlodipine.  ? ? ?

## 2021-04-05 NOTE — Progress Notes (Signed)
? ? ?Referring Provider: [name], DO ?Primary Care Physician:  [name], DO ?Primary GI Physician: Dr. Abbey Chatters ? ?Chief Complaint  ?Patient presents with  ? Diarrhea  ?  Abdominal pain    ? ? ?HPI:   ?[name] is a 88 y.o. female with history of GERD, microscopic colitis on pathology in 2007, 2009, and 2012, chronic abdominal pain.  She had extensive evaluation for abdominal pain including EGD November 2021 with mild Schatzki's ring not manipulated, otherwise normal exam, CTA November 2021 without evidence of chronic mesenteric ischemia, ultrasound abdomen without gallstones 03/2020, HIDA scan unrevealing 03/2020, colonoscopy March 2022 with diverticulosis in sigmoid and descending colon, 6 mm polyp in the sigmoid colon not removed as she was on Eliquis, repeat CTA March 2022 without evidence for large vessel disease, unable to tolerate BuSpar due to hallucinations, not a candidate for TCAs in the past with prolonged QTc, and has noticed most improvement with Imdur daily.  Suspected microvascular angina versus bowel hypersensitivity.  Low cortisol level during admission in 2022, but follow-up ACTH stimulation test wnl.  Also with history of pancreatic lesion dating back to 2020 though to be a cyst, vs IPMN, vs area of interspersed fat without dedicated pancreatic imaging.  ? ?Last seen in our office May 2022.  Overall felt her abdominal pain was much improved with Imdur daily.  She had mild abdominal knotting after eating, but overall much better.  Noted she had lost about 10 pounds in the last few months.  Recommended continuing current medications, add Ensure twice daily between meals, and follow-up in 6-8 weeks for a weight check. ? ?In the interim she was admitted in January 2023 with suspected stroke. ? ?She is presenting today for follow-up with chief complaint of diarrhea.  ? ? ?Today: ?Continues with postprandial abdominal pain radiating across her mid abdomen.  Describes it as  cramping.  Overall, symptoms are essentially the same without worsening, but no further improvement.  She does tell me she does not have any problems with eating bland foods, but has not identified any particular trigger.  When discussing today, she reports pain when eating cornflakes, raisin bran, salads.  No trouble with eating Chinese last night, pork chops recently, oatmeal, or sandwiches.  ? ?Weight has been fluctuating up and down in the 120s over the last 6+ months.  She is trying to eat small, frequent meals and is also drinking 2 boost a day. ? ?She does tell me that her abdominal pain seems to have improved somewhat after having a bowel movement.  She is having watery stools daily with 3-4 bowel movements during the day and a couple of nocturnal stools as well.  Reports diarrhea has been going on for quite some time but is a very difficult historian.  Per chart review, appears she was taking Linzess back in 2021 for constipation.  Tested positive for C. difficile in January 2022.  She remembers taking a course of antibiotics for this, but does not remember if her diarrhea got any better thereafter. Reports history of IBS, but cannot tell me what medication she has been on or what has been helpful.  She is currently taking Pepto-Bismol as needed.  Denies BRBPR or melena.  ? ?No N/V, GERD, or dysphagia.  ? ?No MRI due to AICD.  ?Doesn't want CT for pancreatic lesion.  ? ?Past Medical History:  ?Diagnosis Date  ? AICD (automatic cardioverter/defibrillator) present   ? Allergy   ? SESONAL  ?  Aneurysm of infrarenal abdominal aorta   ? Anxiety   ? ARTHRITIS   ? Arthritis   ? Atrial fibrillation ([place])   ? CAD (coronary artery disease)   ? a. history of cardiac arrest 1993;  b. s/p LAD/LCX stenting in 2003;  c. 07/2011 Cath: LM nl, LAD patent stent, D1 80ost, LCX patent stent, RCA min irregs;  d. 04/2012 MV: EF 66%, no ishcemia;  e. 12/2013 Echo: Ef 55%, no rwma, Gr 1 DD, triv AI/MR, mildly dil LA;  f. 12/2013 Lexi  MV: intermediate risk - apical ischemia and inf/infsept fixed defect, ? artifactual.  ? CAD in native artery 12/31/2013  ? Previous stents to LAD and Ramus patent on cath today 12/31/13 also There is severe disease in the ostial first diagonal which is unchanged from most recent cardiac catheterization. The right coronary artery could not be engaged selectively but nonselective angiography showed no significant disease in the proximal and midsegment.      ? Cataract   ? DENIES  ? Chronic back pain   ? Chronic sinus bradycardia   ? CKD (chronic kidney disease), stage III ([place])   ? Clotting disorder ([place])   ? DVT  ? COLITIS 12/02/2007  ? Qualifier: Diagnosis of  By: [name], [name]    ? Diabetes mellitus without complication ([place])   ? DENIES  ? Diverticulosis of colon (without mention of hemorrhage) 2007  ? Colonoscopy   ? Esophageal stricture   ? a. 2012 s/p dil.  ? Esophagitis, unspecified   ? a. 2012 EGD  ? EXTERNAL HEMORRHOIDS   ? GERD (gastroesophageal reflux disease)   ? omeprazole  ? H/O hiatal hernia   ? Hiatal hernia   ? a. 2012 EGD.  ? History of DVT in the past, not on Coumadin now   ? left  leg  ? HYPERCHOLESTEROLEMIA   ? Hypertension   ? ICD (implantable cardiac defibrillator) in place   ? a. s/p initial ICD in 1993 in setting of cardiac arrest;  b. 01/2007 gen change: Guidant T135 Vitality DS VR single lead ICD.  ? Macular degeneration   ? gets injecton in eye every 5 weeks- last injection - 05/03/2013   ? Myocardial infarction [facility]) 1993  ? Near syncope 05/22/2015  ? Nephrolithiasis, just saw Dr [name]   ? Orthostatic hypotension   ? Peripheral vascular disease ([place])   ? ???  ? RECTAL BLEEDING 12/03/2007  ? Qualifier: Diagnosis of  By: Sharlett Iles MD Byrd Hesselbach   ? Unspecified gastritis and gastroduodenitis without mention of hemorrhage   ? a. 2003 EGD->not noted on 2012 EGD.  ? Unstable angina, neg MI, cath stable.maybe GI 12/30/2013  ? ? ?Past Surgical History:  ?Procedure Laterality  Date  ? BREAST BIOPSY Left 11/2013  ? CARDIAC CATHETERIZATION    ? CARDIAC CATHETERIZATION  01/01/15  ? patent stents -there is severe disease in ostial 1st diag but without change.   ? CARDIAC DEFIBRILLATOR PLACEMENT  02/05/2007  ? Guidant  ? CARDIOVASCULAR STRESS TEST  12/30/2013  ? abnormal  ? CARDIOVERSION N/A 02/13/2019  ? Procedure: CARDIOVERSION;  Surgeon: Sanda Klein, MD;  Location: [facility] ENDOSCOPY;  Service: Cardiovascular;  Laterality: N/A;  ? CATARACT EXTRACTION Right   ? Dr. Katy Fitch  ? COLONOSCOPY    ? COLONOSCOPY WITH PROPOFOL N/A 03/30/2020  ? Procedure: COLONOSCOPY WITH PROPOFOL;  Surgeon: Harvel Quale, MD;  Location: [facility];  Service: Gastroenterology;  Laterality: N/A;  ? coronary stents     ?  CYSTOSCOPY WITH URETEROSCOPY Right 05/20/2013  ? Procedure: CYSTOSCOPY, RIGHT URETEROSCOPY STONE EXTRACTION, Insertion of right DOUBLE J STENT ;  Surgeon: Irine Seal, MD;  Location: [facility];  Service: Urology;  Laterality: Right;  ? CYSTOSCOPY WITH URETEROSCOPY AND STENT PLACEMENT N/A 06/03/2013  ? Procedure: SECOND LOOK CYSTOSCOPY WITH URETEROSCOPY  HOLMIUM LASER LITHO AND STONE EXTRACTION Sammie Bench ;  Surgeon: Malka So, MD;  Location: [facility];  Service: Urology;  Laterality: N/A;  ? ESOPHAGOGASTRODUODENOSCOPY (EGD) WITH PROPOFOL N/A 11/20/2019  ? Procedure: ESOPHAGOGASTRODUODENOSCOPY (EGD) WITH PROPOFOL;  Surgeon: Daneil Dolin, MD;  Location: [facility];  Service: Endoscopy;  Laterality: N/A;  ? GIVENS CAPSULE STUDY N/A 10/30/2016  ? Procedure: GIVENS CAPSULE STUDY;  Surgeon: Irene Shipper, MD;  Location: [facility] ENDOSCOPY;  Service: Endoscopy;  Laterality: N/A;  NEEDS TO BE ADMITTED FOR OBSERVATION PT. HAS DEFIB  ? HEMORROIDECTOMY  80's  ? HOLMIUM LASER APPLICATION Right 03/20/8249  ? Procedure: HOLMIUM LASER APPLICATION;  Surgeon: Irine Seal, MD;  Location: [facility];  Service: Urology;  Laterality: Right;  ? HYSTEROSCOPY WITH D & C  09/02/2010  ? Procedure: DILATATION AND CURETTAGE (D&C)  /HYSTEROSCOPY;  Surgeon: Margarette Asal;  Location: [facility];  Service: Gynecology;  Laterality: N/A;  Dilation and Curettage with Hysteroscopy and Polypectomy  ? IMPLANTABLE CARDIOVERTER DEFIBRILLATOR (ICD) GENE

## 2021-04-07 ENCOUNTER — Other Ambulatory Visit: Payer: Self-pay

## 2021-04-07 ENCOUNTER — Encounter: Payer: Self-pay | Admitting: Gastroenterology

## 2021-04-07 ENCOUNTER — Ambulatory Visit (INDEPENDENT_AMBULATORY_CARE_PROVIDER_SITE_OTHER): Payer: Medicare Other | Admitting: Gastroenterology

## 2021-04-07 VITALS — BP 139/66 | HR 56 | Temp 97.7°F | Ht 63.0 in | Wt 123.2 lb

## 2021-04-07 DIAGNOSIS — R109 Unspecified abdominal pain: Secondary | ICD-10-CM | POA: Diagnosis not present

## 2021-04-07 DIAGNOSIS — R197 Diarrhea, unspecified: Secondary | ICD-10-CM

## 2021-04-07 DIAGNOSIS — K869 Disease of pancreas, unspecified: Secondary | ICD-10-CM

## 2021-04-07 NOTE — Patient Instructions (Signed)
Please have blood work completed at your primary care's office.  ? ?Continue trying to eat several small meals a day.  ? ?Try drinking 2-3 proteins shakes daily. You can try a different type if you no longer like the boost. There are several options. ? ?Further recommendations to follow labs/stool studies.  ? ?Aliene Altes, PA-C ?[facility] ? ?

## 2021-04-08 LAB — BASIC METABOLIC PANEL
BUN/Creatinine Ratio: 20 (calc) (ref 6–22)
BUN: 25 mg/dL (ref 7–25)
CO2: 28 mmol/L (ref 20–32)
Calcium: 9.6 mg/dL (ref 8.6–10.4)
Chloride: 104 mmol/L (ref 98–110)
Creat: 1.23 mg/dL — ABNORMAL HIGH (ref 0.60–0.95)
Glucose, Bld: 91 mg/dL (ref 65–99)
Potassium: 4.2 mmol/L (ref 3.5–5.3)
Sodium: 141 mmol/L (ref 135–146)

## 2021-04-08 LAB — TSH: TSH: 3.56 mIU/L (ref 0.40–4.50)

## 2021-04-09 DIAGNOSIS — Z20822 Contact with and (suspected) exposure to covid-19: Secondary | ICD-10-CM | POA: Diagnosis not present

## 2021-04-18 ENCOUNTER — Encounter (HOSPITAL_COMMUNITY): Payer: Self-pay

## 2021-04-18 ENCOUNTER — Telehealth: Payer: Self-pay | Admitting: Cardiovascular Disease

## 2021-04-18 ENCOUNTER — Emergency Department (HOSPITAL_COMMUNITY)
Admission: EM | Admit: 2021-04-18 | Discharge: 2021-04-18 | Disposition: A | Discharge status: Home / Self Care | Payer: Medicare Other | Attending: Emergency Medicine | Admitting: Emergency Medicine

## 2021-04-18 ENCOUNTER — Other Ambulatory Visit: Payer: Self-pay

## 2021-04-18 DIAGNOSIS — I129 Hypertensive chronic kidney disease with stage 1 through stage 4 chronic kidney disease, or unspecified chronic kidney disease: Secondary | ICD-10-CM | POA: Diagnosis not present

## 2021-04-18 DIAGNOSIS — R42 Dizziness and giddiness: Secondary | ICD-10-CM | POA: Diagnosis not present

## 2021-04-18 DIAGNOSIS — I10 Essential (primary) hypertension: Secondary | ICD-10-CM | POA: Diagnosis not present

## 2021-04-18 DIAGNOSIS — N189 Chronic kidney disease, unspecified: Secondary | ICD-10-CM | POA: Insufficient documentation

## 2021-04-18 DIAGNOSIS — Z95810 Presence of automatic (implantable) cardiac defibrillator: Secondary | ICD-10-CM | POA: Diagnosis not present

## 2021-04-18 DIAGNOSIS — Z79899 Other long term (current) drug therapy: Secondary | ICD-10-CM | POA: Insufficient documentation

## 2021-04-18 DIAGNOSIS — I4510 Unspecified right bundle-branch block: Secondary | ICD-10-CM | POA: Diagnosis not present

## 2021-04-18 DIAGNOSIS — Z7982 Long term (current) use of aspirin: Secondary | ICD-10-CM | POA: Insufficient documentation

## 2021-04-18 DIAGNOSIS — I25118 Atherosclerotic heart disease of native coronary artery with other forms of angina pectoris: Secondary | ICD-10-CM | POA: Diagnosis not present

## 2021-04-18 DIAGNOSIS — R531 Weakness: Secondary | ICD-10-CM | POA: Diagnosis not present

## 2021-04-18 DIAGNOSIS — Z91040 Latex allergy status: Secondary | ICD-10-CM | POA: Insufficient documentation

## 2021-04-18 DIAGNOSIS — R079 Chest pain, unspecified: Secondary | ICD-10-CM | POA: Diagnosis not present

## 2021-04-18 DIAGNOSIS — I209 Angina pectoris, unspecified: Secondary | ICD-10-CM | POA: Diagnosis not present

## 2021-04-18 DIAGNOSIS — Z7901 Long term (current) use of anticoagulants: Secondary | ICD-10-CM | POA: Insufficient documentation

## 2021-04-18 DIAGNOSIS — I208 Other forms of angina pectoris: Secondary | ICD-10-CM

## 2021-04-18 LAB — CBC
HCT: 36.2 % (ref 36.0–46.0)
Hemoglobin: 11.8 g/dL — ABNORMAL LOW (ref 12.0–15.0)
MCH: 32 pg (ref 26.0–34.0)
MCHC: 32.6 g/dL (ref 30.0–36.0)
MCV: 98.1 fL (ref 80.0–100.0)
Platelets: 218 10*3/uL (ref 150–400)
RBC: 3.69 MIL/uL — ABNORMAL LOW (ref 3.87–5.11)
RDW: 14.4 % (ref 11.5–15.5)
WBC: 5 10*3/uL (ref 4.0–10.5)
nRBC: 0 % (ref 0.0–0.2)

## 2021-04-18 LAB — BASIC METABOLIC PANEL
Anion gap: 7 (ref 5–15)
BUN: 31 mg/dL — ABNORMAL HIGH (ref 8–23)
CO2: 27 mmol/L (ref 22–32)
Calcium: 9.2 mg/dL (ref 8.9–10.3)
Chloride: 106 mmol/L (ref 98–111)
Creatinine, Ser: 1.35 mg/dL — ABNORMAL HIGH (ref 0.44–1.00)
GFR, Estimated: 38 mL/min — ABNORMAL LOW (ref >60)
Glucose, Bld: 131 mg/dL — ABNORMAL HIGH (ref 70–99)
Potassium: 4.3 mmol/L (ref 3.5–5.1)
Sodium: 140 mmol/L (ref 135–145)

## 2021-04-18 LAB — TROPONIN I (HIGH SENSITIVITY): Troponin I (High Sensitivity): 6 ng/L (ref <18)

## 2021-04-18 NOTE — ED Provider Notes (Signed)
?[facility] ?Provider Note ? ? ?CSN: [id number] ?Arrival date & time: 04/18/21  1656 ? ?  ? ?History ? ?Chief Complaint  ?Patient presents with  ? Chest Pain  ? ? ?[name] is a 88 y.o. female. ? ?HPI ?Patient presenting for evaluation of bradycardia and chest pain.  She has history of chronic kidney disease, and was recently seen by nephrology for evaluation on 03/23/2021.  At that time she was stable.  She is DNR with palliative care consults at home, last was on 03/15/2021.  She was hospitalized with stroke in January 2023.  She was last evaluated by cardiology in January 2023.  At that time she was evaluated for history of ventricular tachycardia status post defibrillator placement [facility]), history of paroxysmal atrial fibrillation and coronary artery disease evaluated remotely with catheterization and treated with stenting.  At that time she was noted to have unstable angina symptoms, but elected to continue with medical therapy before embarking on further evaluation or interventions.  Discussion was undertaken about the possible dual-chamber pacemaker placement for cardiac synchronization to improve symptoms associated with atrial fibrillation.  Bradycardia at that time was managed by decreasing metoprolol to 12.5 mg daily. ? ? ?Patient is a good historian and states that today she was at home, checking her blood pressure, as usual and it was noted to be over 200.  Also her heart rate was in the mid 40s.  She called her PCP and cardiologist and was directed here for evaluation.  She took 2 nitroglycerin at the time she was having elevated blood pressure, because she was having some chest pain at the time.  She states that now her chest pain has resolved, she does not feel dizzy or weak.  She has no other current complaints. ?  ? ?Home Medications ?Prior to Admission medications   ?Medication Sig Start Date End Date Taking? Authorizing Provider  ?acetaminophen (TYLENOL) 500  MG tablet Take 1 tablet (500 mg total) by mouth every 6 (six) hours as needed for mild pain, fever or headache. 04/05/20   Wynetta Emery, Clanford L, MD  ?Alpha-Lipoic Acid 600 MG TABS Take 600 mg by mouth daily. For neuropathy in legs 08/18/20   Ronnie Doss M, DO  ?amiodarone (PACERONE) 200 MG tablet Take 1 tablet (200 mg total) by mouth daily. 10/04/20   Croitoru, Mihai, MD  ?amLODipine (NORVASC) 2.5 MG tablet Take 1 tablet (2.5 mg total) by mouth daily. Do NOT take if BP drops below 130 on top or below 60 on bottom. ?Patient taking differently: Take 2.5 mg by mouth daily. Do NOT take if BP drops below 130 on top or below 60 on bottom. As needed 03/01/21   [name], DO  ?aspirin EC 81 MG EC tablet Take 1 tablet (81 mg total) by mouth daily. Swallow whole. 02/09/21   Shawna Clamp, MD  ?busPIRone (BUSPAR) 15 MG tablet Take 1 tablet (15 mg total) by mouth 2 (two) times daily. 03/30/21   [name], DO  ?carboxymethylcellulose (REFRESH PLUS) 0.5 % SOLN Place 1 drop into both eyes 3 (three) times daily as needed (dry/irritated eyes.).    [provider]  ?Cholecalciferol (VITAMIN D) 50 MCG (2000 UT) CAPS Take 2,000 Units by mouth every morning.    [provider]  ?ELIQUIS 2.5 MG TABS tablet TAKE (1) TABLET TWICE A DAY. 02/18/21   Croitoru, Mihai, MD  ?feeding supplement (ENSURE ENLIVE / ENSURE PLUS) LIQD Take 237 mLs by mouth 2 (  two) times daily between meals. 12/26/19   Cristal Ford, DO  ?furosemide (LASIX) 20 MG tablet Take 20 mg by mouth 3 (three) times a week.    [provider]  ?gabapentin (NEURONTIN) 100 MG capsule Take 1-3 capsules (100-300 mg total) by mouth at bedtime as needed. 11/05/20   [name], DO  ?isosorbide mononitrate (IMDUR) 60 MG 24 hr tablet Take 1 tablet (60 mg total) by mouth daily. 01/31/21   Croitoru, Mihai, MD  ?levothyroxine (SYNTHROID) 100 MCG tablet Take 1 tablet (100 mcg total) by mouth every morning. 10/19/20   [name], DO   ?MELATONIN PO Take 3 mg by mouth at bedtime.    [provider]  ?metoprolol succinate (TOPROL-XL) 25 MG 24 hr tablet Take 0.5 tablets (12.5 mg total) by mouth daily. Take with or immediately following a meal. ?Patient taking differently: Take 12.5 mg by mouth 2 (two) times daily. Take with or immediately following a meal. 01/31/21 05/01/21  Croitoru, Mihai, MD  ?Multiple Vitamins-Minerals (CENTRUM SILVER ULTRA WOMENS PO) Take 1 tablet by mouth every morning.    [provider]  ?nitroGLYCERIN (NITROSTAT) 0.4 MG SL tablet PLACE 1 TABLET UNDER THE TONGUE AT ONSET OF CHEST PAIN EVERY 5 MINTUES UP TO 3 TIMES AS NEEDED ?Patient taking differently: Place 0.4 mg under the tongue every 5 (five) minutes as needed. 01/05/21   Claretta Fraise, MD  ?pantoprazole (PROTONIX) 40 MG tablet TAKE 1 TABLET ONCE DAILY 02/18/21   Ronnie Doss M, DO  ?potassium chloride SA (KLOR-CON) 20 MEQ tablet Take 20 mEq by mouth 3 (three) times a week.    [provider]  ?rosuvastatin (CRESTOR) 10 MG tablet TAKE 1 TABLET DAILY 02/18/21   Ronnie Doss M, DO  ?valsartan (DIOVAN) 40 MG tablet Take 0.5 tablets by mouth daily. 03/23/21 03/23/22  [provider]  ?   ? ?Allergies    ?Sotalol, Tramadol, Actos [pioglitazone], Adhesive [tape], Atorvastatin, Bactrim [sulfamethoxazole-trimethoprim], Ciprofloxacin, Contrast media [iodinated contrast media], Latex, Pedi-pre tape spray [wound dressing adhesive], and Sulfa antibiotics   ? ?Review of Systems   ?Review of Systems ? ?Physical Exam ?Updated Vital Signs ?BP (!) 167/66   Pulse (!) 58   Temp 98.2 ?F (36.8 ?C) (Oral)   Resp 13   Ht 5\' 3"  (1.6 m)   Wt 54 kg   SpO2 100%   BMI 21.08 kg/m?  ?Physical Exam ?Vitals and nursing note reviewed.  ?Constitutional:   ?   Appearance: She is well-developed. She is not ill-appearing.  ?HENT:  ?   Head: Normocephalic and atraumatic.  ?   Right Ear: External ear normal.  ?   Left Ear: External ear normal.  ?Eyes:  ?    Conjunctiva/sclera: Conjunctivae normal.  ?   Pupils: Pupils are equal, round, and reactive to light.  ?Neck:  ?   Trachea: Phonation normal.  ?Cardiovascular:  ?   Rate and Rhythm: Normal rate and regular rhythm.  ?   Heart sounds: Normal heart sounds.  ?Pulmonary:  ?   Effort: Pulmonary effort is normal. No respiratory distress.  ?   Breath sounds: Normal breath sounds. No stridor.  ?Abdominal:  ?   Palpations: Abdomen is soft.  ?   Tenderness: There is no abdominal tenderness.  ?Musculoskeletal:     ?   General: Normal range of motion.  ?   Cervical back: Normal range of motion and neck supple.  ?Skin: ?   General: Skin is warm and dry.  ?  Neurological:  ?   Mental Status: She is alert and oriented to person, place, and time.  ?   Cranial Nerves: No cranial nerve deficit.  ?   Sensory: No sensory deficit.  ?   Motor: No abnormal muscle tone.  ?   Coordination: Coordination normal.  ?Psychiatric:     ?   Mood and Affect: Mood normal.     ?   Behavior: Behavior normal.     ?   Thought Content: Thought content normal.     ?   Judgment: Judgment normal.  ? ? ?ED Results / Procedures / Treatments   ?Labs ?(all labs ordered are listed, but only abnormal results are displayed) ?Labs Reviewed  ?BASIC METABOLIC PANEL - Abnormal; Notable for the following components:  ?    Result Value  ? Glucose, Bld 131 (*)   ? BUN 31 (*)   ? Creatinine, Ser 1.35 (*)   ? GFR, Estimated 38 (*)   ? All other components within normal limits  ?CBC - Abnormal; Notable for the following components:  ? RBC 3.69 (*)   ? Hemoglobin 11.8 (*)   ? All other components within normal limits  ?TROPONIN I (HIGH SENSITIVITY)  ?TROPONIN I (HIGH SENSITIVITY)  ? ? ?EKG ?EKG Interpretation ? ?Date/Time:  Monday April 18 2021 17:10:29 EDT ?Ventricular Rate:  56 ?PR Interval:  317 ?QRS Duration: 149 ?QT Interval:  461 ?QTC Calculation: 445 ?R Axis:   -40 ?Text Interpretation: Sinus or ectopic atrial rhythm Prolonged PR interval Left bundle branch block since  last tracing no significant change Confirmed by Daleen Bo [phone]) on 04/18/2021 5:46:28 PM ? ?Radiology ?No results found. ? ?Procedures ?Procedures  ? ? ?Medications Ordered in ED ?Medications - No data to display

## 2021-04-18 NOTE — ED Triage Notes (Signed)
Patient from home with complaints of chest pain relived by two nitro PTA. Complaining of generalized weakness and hypertension for the past four days.  ?

## 2021-04-18 NOTE — Telephone Encounter (Signed)
STAT if HR is under 50 or over 120 ?(normal HR is 60-100 beats per minute) ? ?What is your heart rate? 46 ? ?Do you have a log of your heart rate readings (document readings)? Yes  ? ?Do you have any other symptoms? Pre-syncope, weak ?

## 2021-04-18 NOTE — Telephone Encounter (Signed)
Patient called in to triage reporting pulse of 46 and feeling like she wants to pass out. She hasn't felt good for 2-3 days. I recommended that she go to ED, and if someone was with her. Her health aide Darlina Guys was there. I had her check blood pressure. 202/107, P 55. After a minute, BP 212/107. Patient reported chest pain 6/10. Heath aide gave NTG. After 5 minutes another ntg given. Pain 5/10 , BP 171/96, P 61. Patient stated she thinks the pain is from her defibrillator. Son [name] arrived and patient gave me permission to speak with him. I explained the above and recommended that patient go to ED to be evaluated. He said okay. ?

## 2021-04-18 NOTE — Discharge Instructions (Signed)
Continue taking your usual medicine.  Follow-up with your primary care doctor and cardiologist, as scheduled, and as needed. ?

## 2021-04-20 ENCOUNTER — Ambulatory Visit (INDEPENDENT_AMBULATORY_CARE_PROVIDER_SITE_OTHER): Payer: Medicare Other

## 2021-04-20 DIAGNOSIS — I472 Ventricular tachycardia, unspecified: Secondary | ICD-10-CM

## 2021-04-21 LAB — CUP PACEART REMOTE DEVICE CHECK
Battery Remaining Longevity: 132 mo
Battery Remaining Percentage: 100 %
Brady Statistic RV Percent Paced: 0 %
Date Time Interrogation Session: 20230406103900
HighPow Impedance: 39 Ohm
Implantable Lead Implant Date: 20090120
Implantable Lead Location: 753860
Implantable Lead Model: 157
Implantable Lead Serial Number: 138237
Implantable Pulse Generator Implant Date: 20160126
Lead Channel Impedance Value: 533 Ohm
Lead Channel Pacing Threshold Amplitude: 0.9 V
Lead Channel Pacing Threshold Pulse Width: 0.5 ms
Lead Channel Setting Pacing Amplitude: 2.2 V
Lead Channel Setting Pacing Pulse Width: 0.5 ms
Lead Channel Setting Sensing Sensitivity: 0.6 mV
Pulse Gen Serial Number: 191956

## 2021-05-03 ENCOUNTER — Other Ambulatory Visit: Payer: Self-pay | Admitting: Family Medicine

## 2021-05-03 DIAGNOSIS — F321 Major depressive disorder, single episode, moderate: Secondary | ICD-10-CM

## 2021-05-03 DIAGNOSIS — F411 Generalized anxiety disorder: Secondary | ICD-10-CM

## 2021-05-03 DIAGNOSIS — R630 Anorexia: Secondary | ICD-10-CM

## 2021-05-05 ENCOUNTER — Telehealth: Payer: Medicare Other

## 2021-05-05 NOTE — Progress Notes (Signed)
Remote ICD transmission.   

## 2021-05-09 ENCOUNTER — Encounter: Payer: Self-pay | Admitting: Cardiovascular Disease

## 2021-05-09 ENCOUNTER — Ambulatory Visit (INDEPENDENT_AMBULATORY_CARE_PROVIDER_SITE_OTHER): Payer: Medicare Other | Admitting: Cardiovascular Disease

## 2021-05-09 VITALS — BP 130/70 | HR 56 | Ht 63.0 in | Wt 123.2 lb

## 2021-05-09 DIAGNOSIS — I472 Ventricular tachycardia, unspecified: Secondary | ICD-10-CM | POA: Diagnosis not present

## 2021-05-09 DIAGNOSIS — I25119 Atherosclerotic heart disease of native coronary artery with unspecified angina pectoris: Secondary | ICD-10-CM

## 2021-05-09 DIAGNOSIS — R4189 Other symptoms and signs involving cognitive functions and awareness: Secondary | ICD-10-CM | POA: Diagnosis not present

## 2021-05-09 DIAGNOSIS — I700 Atherosclerosis of aorta: Secondary | ICD-10-CM

## 2021-05-09 DIAGNOSIS — D6869 Other thrombophilia: Secondary | ICD-10-CM

## 2021-05-09 DIAGNOSIS — R001 Bradycardia, unspecified: Secondary | ICD-10-CM

## 2021-05-09 DIAGNOSIS — E7800 Pure hypercholesterolemia, unspecified: Secondary | ICD-10-CM | POA: Diagnosis not present

## 2021-05-09 DIAGNOSIS — Z95810 Presence of automatic (implantable) cardiac defibrillator: Secondary | ICD-10-CM

## 2021-05-09 DIAGNOSIS — I951 Orthostatic hypotension: Secondary | ICD-10-CM | POA: Diagnosis not present

## 2021-05-09 DIAGNOSIS — I480 Paroxysmal atrial fibrillation: Secondary | ICD-10-CM | POA: Diagnosis not present

## 2021-05-09 DIAGNOSIS — Z5181 Encounter for therapeutic drug level monitoring: Secondary | ICD-10-CM | POA: Diagnosis not present

## 2021-05-09 DIAGNOSIS — N1831 Chronic kidney disease, stage 3a: Secondary | ICD-10-CM

## 2021-05-09 DIAGNOSIS — I639 Cerebral infarction, unspecified: Secondary | ICD-10-CM

## 2021-05-09 DIAGNOSIS — E119 Type 2 diabetes mellitus without complications: Secondary | ICD-10-CM

## 2021-05-09 DIAGNOSIS — Z79899 Other long term (current) drug therapy: Secondary | ICD-10-CM

## 2021-05-09 NOTE — Patient Instructions (Signed)
Medication Instructions:  No changes *If you need a refill on your cardiac medications before your next appointment, please call your pharmacy*   Lab Work: None ordered If you have labs (blood work) drawn today and your tests are completely normal, you will receive your results only by: MyChart Message (if you have MyChart) OR A paper copy in the mail If you have any lab test that is abnormal or we need to change your treatment, we will call you to review the results.   Testing/Procedures: None ordered   Follow-Up: At [facility], you and your health needs are our priority.  As part of our continuing mission to provide you with exceptional heart care, we have created designated Provider Care Teams.  These Care Teams include your primary Cardiologist (physician) and Advanced Practice Providers (APPs -  Physician Assistants and Nurse Practitioners) who all work together to provide you with the care you need, when you need it.  We recommend signing up for the patient portal called "MyChart".  Sign up information is provided on this After Visit Summary.  MyChart is used to connect with patients for Virtual Visits (Telemedicine).  Patients are able to view lab/test results, encounter notes, upcoming appointments, etc.  Non-urgent messages can be sent to your provider as well.   To learn more about what you can do with MyChart, go to [email or web address].    Your next appointment:   6 month(s)  The format for your next appointment:   In Person  Provider:   Mihai Croitoru, MD {    Important Information About Sugar       

## 2021-05-09 NOTE — Progress Notes (Signed)
. ?Patient ID: [name], female   DOB: [birth year], 88 y.o.   MRN: [id number] ?  ? ?Cardiology Office Note   ? ?Date:  05/10/2021  ? ?ID:  [name], DOB Aug 10, 1931, MRN [id number] ? ?PCP:  [name], DO  ?Cardiologist:   Sanda Klein, MD  ? ?Chief Complaint  ?Patient presents with  ? Atrial Fibrillation  ? Congestive Heart Failure  ?   ?ICD check  ? icd check  ? ? ? ?History of Present Illness:  ?[name] is a 88 y.o. 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 ? ?She was seen in the emergency room for chest pain on 04/18/2021.  This happened after she noticed that her blood pressure was unusually high "over 200", and her heart rate was relatively slow in the mid 40s.  She took 2 sublingual nitroglycerin tablets with resolution of chest discomfort.  Evaluation in the emergency room showed the known left bundle branch block which makes her ECG nondiagnostic, sinus bradycardia at 56 bpm.  High-sensitivity troponin was within normal limits.  She was felt to be euvolemic.  No further work-up was performed.  A stress test was suggested. ? ?She brings a detailed log of her blood pressure and heart rate, By her nurses aide.  Heart rate is typical in the high 40s to high 50s.  Her blood pressure has been a little variable as low as 107/67 as high as 161/81, but usually in the 120s-130s/70s. ? ?She denies dizziness or syncope.  She is sometimes troubled as palpitations.  She describes this as a  throbbing sensation in her chest and side of her head that she feels usually when she is lying in bed at night.  Able to reproduce her sensations of throbbing by pacing the ventricle in clinic today.  This is a longstanding complaint and we have been trying to avoid ventricular pacing by keeping her lower rate limit low at 45 bpm and decreasing her beta-blocker dose.  She does not have an atrial lead and has declined invasive procedures such as upgrade of her device to a dual-chamber device.  She also reiterates the fact that she does not want to have invasive procedures such as cardiac catheterization.  She has not had problems with lower extremity edema, orthopnea or PND.  She is quite sedentary but seems to describe NYHA functional class II exertional dyspnea.  She has a prescription for furosemide to be taken as needed and has not taken it in several months.  She also has a prescription for low-dose of valsartan to take as needed for elevated blood pressure, prescribed by Dr. Theador Hawthorne. ? ?She also complains of fatigue, but does not have hypersomnolence. ? ?Interrogation of her defibrillator shows normal findings.  Her heart rate histograms show that typically she has heart rates in the 50s and 60s, infrequently in the 40s and almost never over 70 bpm.  In the past heart rate histograms tended to show a second heart rate peak in the 90s, presumably atrial fibrillation.  None is seen on his download.  Device function is otherwise normal.  Lead parameters are normal.  She has not had any episodes of ventricular tachycardia.  She has less than 1% ventricular pacing.  Device longevity is estimated at over 10 years. ? ?She remains very lean with a BMI just under 22, but has not lost weight in the last year. ? ?She has mild cognitive deficits, especially short-term memory loss, but continues to live independently with support from family and nursing aide. ? ?She presented with a couple of episodes of abdominal pain  evaluated in the emergency room in February and was finally hospitalized in March 2022.  She underwent a colonoscopy that showed diverticulosis and a carpet polyp (not removed due to anticoagulation), but no other major findings.  CT of the abdomen described a focal dissection of the infrarenal aorta (old).  There was no evidence of acute mesenteric ischemia, but the patient was diagnosed as probably having intestinal angina with microvascular etiology (versus bowel hypersensitivity), and improvement was reportedly noted with isosorbide mononitrate 30 mg daily..  Her abdominal pain is a "knotting sensation",  clearly worsened by food and she has developed some degree of food aversion.  She has to eat very small meals.  Her nephrologist has been encouraging her to make sure that she drinks enough fluid.  ? ? ?She was hospitalized in June 2022 with cellulitis related to an ulceration of the left great toe for about 4 days.  She was seen again in the emergency room with left leg pain (without evidence of recurrent infection) had a CT angiogram of the chest abdomen and pelvis, describing the presence of aortic atherosclerosis and "old appearing focal dissection flap or a penetrating ulcer in the distal abdominal aorta, lumen measures up to 2.1 cm at this level, no acute dissection, no aneurysm".  The focal dissection was also present on the study in March 2022.  The celiac artery, superior mesenteric artery and inferior mesenteric artery are specifically described as patent.  The right renal artery described as irregular, possible fibromuscular dysplasia without mention of actual stenosis.  There was no evidence of hepatic biliary duct dilation or gallstones.  Indeterminant 7 mm hypodense lesion in the neck of the pancreas was described, although this was not seen on the study from March 2022 and February 2020.  She has extensive chronic nephrolithiasis/nephrocalcinosis and T12 and L1 compression fractures which are  old ? ?In recent years she has had increasing problems with symptomatic atrial fibrillation.  Although she has a single chamber defibrillator, making it impossible to precisely diagnose the episodes of atrial fibrillation, there is clear evidence of periods of increased ventricular rate from her baseline, likely representing symptomatic atrial fibrillation.  She develops palpitation, chest discomfort, shortness of breath and if the spells are lengthy she eventually develops overt lower extremity edema.  She underwent a cardioversion in January 2021 from atrial fibrillation to normal rhythm, associated with improvement in symptoms.  She has been taking amiodarone since February.  She was hospitalized with dizziness, orthostatic hypotension and severe hypothyroidism (TSH 56) in July of this year.  It is likely she had iatrogenic hypothyroidism due to amiodarone.  Levothyroxine was started, beta-blocker therapy was discontinued. ? ?She was on treatment with disopyramide for years for her history of catecholaminergic polymorphic VT, but this medication had to be gradually discontinued due to side effects, especially orthostatic hypotension and dry mouth.  In the past she developed torsades de pointes with sotalol and diarrhea with quinidine.  She has not had defibrillator discharges since the 1990s. ? ? ?  Past Medical History:  ?Diagnosis Date  ? AICD (automatic cardioverter/defibrillator) present   ? Allergy   ? SESONAL  ? Aneurysm of infrarenal abdominal aorta (HCC)   ? Anxiety   ? ARTHRITIS   ? Arthritis   ? Atrial fibrillation ([place])   ? CAD (coronary artery disease)   ? a. history of cardiac arrest 1993;  b. s/p LAD/LCX stenting in 2003;  c. 07/2011 Cath: LM nl, LAD patent stent, D1 80ost, LCX patent stent, RCA min irregs;  d. 04/2012 MV: EF 66%, no ishcemia;  e. 12/2013 Echo: Ef 55%, no rwma, Gr 1 DD, triv AI/MR, mildly dil LA;  f. 12/2013 Lexi MV: intermediate risk - apical ischemia and inf/infsept fixed defect, ?  artifactual.  ? CAD in native artery 12/31/2013  ? Previous stents to LAD and Ramus patent on cath today 12/31/13 also There is severe disease in the ostial first diagonal which is unchanged from most recent ca

## 2021-05-10 ENCOUNTER — Encounter: Payer: Self-pay | Admitting: Cardiovascular Disease

## 2021-05-10 DIAGNOSIS — Z20822 Contact with and (suspected) exposure to covid-19: Secondary | ICD-10-CM | POA: Diagnosis not present

## 2021-05-11 ENCOUNTER — Ambulatory Visit (INDEPENDENT_AMBULATORY_CARE_PROVIDER_SITE_OTHER): Payer: Medicare Other | Admitting: Family Medicine

## 2021-05-11 ENCOUNTER — Encounter: Payer: Self-pay | Admitting: Family Medicine

## 2021-05-11 VITALS — BP 133/76 | HR 52 | Temp 97.8°F | Ht 63.0 in | Wt 124.1 lb

## 2021-05-11 DIAGNOSIS — I129 Hypertensive chronic kidney disease with stage 1 through stage 4 chronic kidney disease, or unspecified chronic kidney disease: Secondary | ICD-10-CM | POA: Diagnosis not present

## 2021-05-11 DIAGNOSIS — R531 Weakness: Secondary | ICD-10-CM

## 2021-05-11 DIAGNOSIS — K625 Hemorrhage of anus and rectum: Secondary | ICD-10-CM

## 2021-05-11 DIAGNOSIS — I480 Paroxysmal atrial fibrillation: Secondary | ICD-10-CM

## 2021-05-11 DIAGNOSIS — R809 Proteinuria, unspecified: Secondary | ICD-10-CM | POA: Diagnosis not present

## 2021-05-11 DIAGNOSIS — R319 Hematuria, unspecified: Secondary | ICD-10-CM

## 2021-05-11 DIAGNOSIS — N1832 Chronic kidney disease, stage 3b: Secondary | ICD-10-CM | POA: Diagnosis not present

## 2021-05-11 DIAGNOSIS — R58 Hemorrhage, not elsewhere classified: Secondary | ICD-10-CM | POA: Diagnosis not present

## 2021-05-11 DIAGNOSIS — I5032 Chronic diastolic (congestive) heart failure: Secondary | ICD-10-CM | POA: Diagnosis not present

## 2021-05-11 LAB — MICROSCOPIC EXAMINATION: Renal Epithel, UA: NONE SEEN /hpf

## 2021-05-11 LAB — URINALYSIS, ROUTINE W REFLEX MICROSCOPIC
Bilirubin, UA: NEGATIVE
Glucose, UA: NEGATIVE
Ketones, UA: NEGATIVE
Leukocytes,UA: NEGATIVE
Nitrite, UA: NEGATIVE
Specific Gravity, UA: 1.01 (ref 1.005–1.030)
Urobilinogen, Ur: 0.2 mg/dL (ref 0.2–1.0)
pH, UA: 5.5 (ref 5.0–7.5)

## 2021-05-11 LAB — HEMOGLOBIN, FINGERSTICK: Hemoglobin: 11.3 g/dL (ref 11.1–15.9)

## 2021-05-11 MED ORDER — CEPHALEXIN 500 MG PO CAPS: 500.0000 mg | ORAL_CAPSULE | Freq: Two times a day (BID) | ORAL | 0 refills | Status: DC

## 2021-05-11 NOTE — Patient Instructions (Signed)
Hematuria, Adult Hematuria is blood in the urine. Blood may be visible in the urine, or it may be identified with a test. This condition can be caused by infections of the bladder, urethra, kidney, or prostate. Other possible causes include: Kidney stones. Cancer of the urinary tract. Too much calcium in the urine. Conditions that are passed from parent to child (inherited conditions). Exercise that requires a lot of energy. Infections can usually be treated with medicine, and a kidney stone usually will pass through your urine. If neither of these is the cause of your hematuria, more tests may be needed to identify the cause of your symptoms. It is very important to tell your health care provider about any blood in your urine, even if it is painless or the blood stops without treatment. Blood in the urine, when it happens and then stops and then happens again, can be a symptom of a very serious condition, including cancer. There is no pain in the initial stages of many urinary cancers. Follow these instructions at home: Medicines Take over-the-counter and prescription medicines only as told by your health care provider. If you were prescribed an antibiotic medicine, take it as told by your health care provider. Do not stop taking the antibiotic even if you start to feel better. Eating and drinking Drink enough fluid to keep your urine pale yellow. It is recommended that you drink 3-4 quarts (2.8-3.8 L) a day. If you have been diagnosed with an infection, drinking cranberry juice in addition to large amounts of water is recommended. Avoid caffeine, tea, and carbonated beverages. These tend to irritate the bladder. Avoid alcohol because it may irritate the prostate (in males). General instructions If you have been diagnosed with a kidney stone, follow your health care provider's instructions about straining your urine to catch the stone. Empty your bladder often. Avoid holding urine for long  periods of time. If you are female: After a bowel movement, wipe from front to back and use each piece of toilet paper only once. Empty your bladder before and after sex. Pay attention to any changes in your symptoms. Tell your health care provider about any changes or any new symptoms. It is up to you to get the results of any tests. Ask your health care provider, or the department that is doing the test, when your results will be ready. Keep all follow-up visits. This is important. Contact a health care provider if: You develop back pain. You have a fever or chills. You have nausea or vomiting. Your symptoms do not improve after 3 days. Your symptoms get worse. Get help right away if: You develop severe vomiting and are unable to take medicine without vomiting. You develop severe pain in your back or abdomen even though you are taking medicine. You pass a large amount of blood in your urine. You pass blood clots in your urine. You feel very weak or like you might faint. You faint. Summary Hematuria is blood in the urine. It has many possible causes. It is very important that you tell your health care provider about any blood in your urine, even if it is painless or the blood stops without treatment. Take over-the-counter and prescription medicines only as told by your health care provider. Drink enough fluid to keep your urine pale yellow. This information is not intended to replace advice given to you by your health care provider. Make sure you discuss any questions you have with your health care provider. Document Revised: 09/03/2019   Document Reviewed: 09/03/2019 Elsevier Patient Education  2023 [employer] Inc.  

## 2021-05-11 NOTE — Addendum Note (Signed)
Addended by: Gwenlyn Perking on: 05/11/2021 01:14 PM ? ? Modules accepted: Orders ? ?

## 2021-05-11 NOTE — Progress Notes (Addendum)
? ?Acute Office Visit ? ?Subjective:  ? ?  ?Patient ID: [name], female    DOB: Feb 17, 1931, 88 y.o.   MRN: [id number] ? ?Chief Complaint  ?Patient presents with  ? Rectal Bleeding  ? Weakness  ? ? ?HPI ?Here with daughter. Patient is in today for rectal bleeding x 6 days. She has been wearing an incontinence pad and has bleeding on the pad. Her sitter saw this on her pajamas as well. She also has noticed blood on the toilet paper with wiping, sometimes with a BM and sometimes with urination. She denies constipation. She has been feeling dizzy and lightheaded. She reports that she just "doesn't feel right". This is not new for her and she reports this is not worse than usual. She has chronic gastritis and she typically has upper abdominal pain. She has had lower abdominal pain the last few days that has been constant. She does not that the pain has improved a little day. She has chronic diarrhea. Reports no changes in bowel habits. She denies urinary symptoms, fever, chills, or vaginal symptoms. She is on eliquis. She is established with GI.  ? ?ROS ?As per HPI.  ? ?   ?Objective:  ?  ?BP 133/76 Comment: at home reading per pt  Pulse (!) 52   Temp 97.8 ?F (36.6 ?C) (Temporal)   Ht 5' 3"  (1.6 m)   Wt 124 lb 2 oz (56.3 kg)   BMI 21.99 kg/m?  ?BP Readings from Last 3 Encounters:  ?05/11/21 133/76  ?05/09/21 130/70  ?04/18/21 (!) 170/66  ? ?  ? ?Physical Exam ?Vitals and nursing note reviewed. Exam conducted with a chaperone present.  ?Constitutional:   ?   General: She is not in acute distress. ?   Appearance: She is not ill-appearing, toxic-appearing or diaphoretic.  ?Cardiovascular:  ?   Rate and Rhythm: Normal rate and regular rhythm.  ?   Heart sounds: Normal heart sounds. No murmur heard. ?Pulmonary:  ?   Effort: Pulmonary effort is normal. No respiratory distress.  ?   Breath sounds: Normal breath sounds.  ?Abdominal:  ?   General: Bowel sounds are normal. There is no distension.  ?   Palpations:  Abdomen is soft.  ?   Tenderness: There is no abdominal tenderness. There is no guarding or rebound.  ?Genitourinary: ?   General: Normal vulva.  ?   Exam position: Lithotomy position.  ?   Labia:     ?   Right: No rash or tenderness.     ?   Left: No rash or tenderness.   ?   Vagina: No signs of injury. No erythema, tenderness, bleeding or lesions.  ?   Rectum: Guaiac result negative. No mass, tenderness, anal fissure, external hemorrhoid or internal hemorrhoid.  ?Musculoskeletal:  ?   Right lower leg: No edema.  ?   Left lower leg: No edema.  ?Skin: ?   General: Skin is warm and dry.  ?Neurological:  ?   Mental Status: She is alert and oriented to person, place, and time. Mental status is at baseline.  ?   Motor: Weakness (generalized) present.  ?   Gait: Gait abnormal (rolling walker).  ?Psychiatric:     ?   Mood and Affect: Mood normal.     ?   Behavior: Behavior normal.  ? ? ?Results for orders placed or performed in visit on 05/11/21  ?Microscopic Examination  ? Urine  ?Result Value Ref Range  ?  WBC, UA 0-5 0 - 5 /hpf  ? RBC 3-10 (A) 0 - 2 /hpf  ? Epithelial Cells (non renal) 0-10 0 - 10 /hpf  ? Renal Epithel, UA None seen None seen /hpf  ? Bacteria, UA Few (A) None seen/Few  ?Urinalysis, Routine w reflex microscopic  ?Result Value Ref Range  ? Specific Gravity, UA 1.010 1.005 - 1.030  ? pH, UA 5.5 5.0 - 7.5  ? Color, UA Yellow Yellow  ? Appearance Ur Clear Clear  ? Leukocytes,UA Negative Negative  ? Protein,UA Trace (A) Negative/Trace  ? Glucose, UA Negative Negative  ? Ketones, UA Negative Negative  ? RBC, UA Trace (A) Negative  ? Bilirubin, UA Negative Negative  ? Urobilinogen, Ur 0.2 0.2 - 1.0 mg/dL  ? Nitrite, UA Negative Negative  ? Microscopic Examination See below:   ? ? ? ?   ?Assessment & Plan:  ? ?Yalissa was seen today for rectal bleeding and weakness. ? ?Diagnoses and all orders for this visit: ? ?Rectal bleeding ?-     Hemoglobin, fingerstick ?-     IFOBT POC (occult bld, rslt in office);  Future ? ?Weakness ?-     Hemoglobin, fingerstick ? ?Hematuria, unspecified type ?-     Urinalysis, Routine w reflex microscopic ?-     Urine Culture ?-     cephALEXin (KEFLEX) 500 MG capsule; Take 1 capsule (500 mg total) by mouth 2 (two) times daily. ?-     Microscopic Examination ? ?Paroxysmal atrial fibrillation, chads2 Vasc2 score of 5, on eliquis ? ? ? ?Rectal exam today without fissure or hemorrhoid. Negative FOBT in office today. POCT Hgb 11.3 today. Reports dizziness and weakness is baseline for patient. Pelvic exam today without lesion or bleeding. UA + for RBCs and few bacteria. Urine culture is pending. Started on Keflex pending culture. If culture is negative, and bleeding continues will refer to urology. Patient also given at home hemoccult kit to return with. Instructed to continue eliquis at 2.5 mg BID as she is at higher risk for stroke given history of a.fib.  ? ? ?Return if symptoms worsen or fail to improve. ? ?Gwenlyn Perking, FNP ? ? ?

## 2021-05-12 ENCOUNTER — Ambulatory Visit: Payer: Medicare Other | Admitting: Family Medicine

## 2021-05-12 DIAGNOSIS — Z20822 Contact with and (suspected) exposure to covid-19: Secondary | ICD-10-CM | POA: Diagnosis not present

## 2021-05-13 LAB — URINE CULTURE

## 2021-05-16 ENCOUNTER — Other Ambulatory Visit: Payer: Medicare Other

## 2021-05-16 DIAGNOSIS — K625 Hemorrhage of anus and rectum: Secondary | ICD-10-CM | POA: Diagnosis not present

## 2021-05-17 ENCOUNTER — Other Ambulatory Visit: Payer: Self-pay | Admitting: Family Medicine

## 2021-05-17 LAB — FECAL OCCULT BLOOD, IMMUNOCHEMICAL: Fecal Occult Bld: POSITIVE — AB

## 2021-05-19 ENCOUNTER — Encounter: Payer: Self-pay | Admitting: Gastroenterology

## 2021-05-19 ENCOUNTER — Telehealth: Payer: Medicare Other

## 2021-05-19 ENCOUNTER — Ambulatory Visit (INDEPENDENT_AMBULATORY_CARE_PROVIDER_SITE_OTHER): Payer: Medicare Other | Admitting: Gastroenterology

## 2021-05-19 VITALS — BP 114/72 | HR 62 | Temp 97.4°F | Ht 63.0 in | Wt 126.6 lb

## 2021-05-19 DIAGNOSIS — K625 Hemorrhage of anus and rectum: Secondary | ICD-10-CM

## 2021-05-19 NOTE — Patient Instructions (Signed)
I have ordered labs for you to have completed within the next week (early next week is fine).  ? ?I want you to begin taking over the counter hemorrhoid cream to help with some rectal discomfort. If rectal pain worsens or bleeding returns then follow back up in the office for further evaluation.  ? ?Also if abdominal pain worsens more than your baseline pain and you have rectal bleeding with dizziness, increased weakness then proceed to the ED.  ? ?It was a pleasure to see you today. I want to create trusting relationships with patients. If you receive a survey regarding your visit,  I greatly appreciate you taking time to fill this out on paper or through your MyChart. I value your feedback. ? ?Venetia Night, MSN, FNP-BC, AGACNP-BC ?[facility] ? ? ?

## 2021-05-19 NOTE — Progress Notes (Signed)
? ?GI Office Note   ? ?Referring Provider: [name], DO ?Primary Care Physician:  [name], DO ?Primary GI: Dr. Abbey Chatters ? ?Date:  05/19/2021  ?ID:  [name], DOB January 21, 1931, MRN [id number] ? ? ?Chief Complaint  ? ?Chief Complaint  ?Patient presents with  ? Blood In Stools  ?  Diarrhea   ? ? ? ?History of Present Illness  ?[name] is a 88 y.o. female presenting today with a history of GERD, C. difficile in January 2022, microscopic colitis on pathology in 2007, 2009, 2012, chronic pain.  She has had extensive evaluation of abdominal pain with EGD in November 2021 with mild Schatzki's ring not manipulated, CTA November 2021 without evidence of chronic mesenteric ischemia, abdominal ultrasound without gallstones in March 2022, HIDA scan also unrevealing at that time, colonoscopy in March 2022 with diverticulosis in the sigmoid and descending colon, 6 mm polyp in the sigmoid colon not removed (on Eliquis), repeat CTA March 2022 without evidence of large vessel disease, unable to tolerate BuSpar due to hallucinations, not a candidate for TCAs in the past due to prolonged QTc, had improvement with Imdur daily.  She has had ACTH stimulation test that was within normal limits.  She was suspected of having microvascular angina versus bowel hypersensitivity.  Evidence of pancreatic lesion on imaging dating back to 2020 thought to be a cyst versus IPMN versus fat without dedicated pancreatic imaging.  She was recently admitted to the hospital in January 2023 with a suspected stroke.  She is presenting today with complaint of rectal bleeding. ? ?Last seen in our office 04/07/2021 -she was seen for diarrhea and continued postprandial abdominal pain.  Her weight has been fluctuating.  She was trying to drink to boost shakes a day.  She reported that her abdominal pain was somewhat improved after having a bowel movement.  She was continuing to have watery stools 3-4 times a day with a couple of  nocturnal stools.  She was taking Pepto-Bismol as needed.  Denying any BRBPR or melena. ? ?In the interim she has been evaluated in the ED in early April 2023 for chest pain, hypertension, and bradycardia.  She reported BP elevated to systolic over 427 at home with chest pain.  She took 2 nitroglycerin tablets which helped relieve her chest pain.  She also noted her heart rate to be 40.  Upon monitoring in the ED her heart rate improved to 60, BP was stabilized with no more chest pain.  She was recommended follow-up with her PCP and cardiology as needed ? ? ?She was recently seen by her PCP on 05/11/2021 with complaint of rectal bleeding for 6 days.  Was wearing an incontinence pad and had bleeding on the pad.  Her sister had reported blood on her pajamas.  Also has noted some blood on toilet tissue with urination and defecation.  Also noted to be dizzy and lightheaded.  She underwent rectal exam without noted fissure or hemorrhoid.  Also FOBT negative in the office.  POCT hemoglobin was 11.3.(Of note the dizziness and weakness was reported to be baseline for the patient).  Pelvic exam without lesions or bleeding, UA positive for RBCs and a few bacteria.  She was given Keflex. ? ?Recent home FOBT positive for occult blood. ? ?Has kidney stones right now, has had them in the past. Originally thought her kidney stones were causing the pain.  ? ? ?Today: ?Patient presents for evaluation of blood in her stool.  Patient has associated symptoms of abdominal pain, diarrhea, and loose stools. The patient denies constipation and visible blood: none. The patient has a known history of: anticoagulation therapy and diverticulosis, along with extensive PVD and CAD, DVT. The patient has had a few episodes of rectal bleeding. There is not a history of rectal injury. Patient has not had similar episodes of rectal bleeding in the past. ?She previously reported to PCP at the last 6 days of bleeding, reports to me today that there was 3  days of larger amounts of blood in her underwear pad, pajamas and off toilet tissue.  This is since improved and is only having occasional blood on toilet tissue.  She did have associated abdominal cramping symptoms, however she does have chronic abdominal pain.  She also has complained of weakness however states it is not more than her baseline.  She has also been experiencing a little bit of rectal discomfort.  ? ?Her nursing aide who is with her today states that her stools have been brown in color, no obvious blood in her stool or in the commode, somewhat mushy and loose stools.  Patient states that she has chronic diarrhea.  Denies any nausea vomiting, reflux symptoms, unintentional weight loss. ? ?Patient reports that her heart rate has been low she has been recently seen cardiology about increasing her heart rate by changing medications.  She feels like this may be why she has been weak.  Patient also reports that she follows closely with nephrology.  She has a history of kidney stones, reports that she currently has kidney stones in both of her kidneys.  Originally felt like her pain and possibly her bleeding was related to her passing a kidney stone.  She reports she was seen by her PCP also with concerns for UTI. ? ? ?Past Medical History:  ?Diagnosis Date  ? AICD (automatic cardioverter/defibrillator) present   ? Allergy   ? SESONAL  ? Aneurysm of infrarenal abdominal aorta (HCC)   ? Anxiety   ? ARTHRITIS   ? Arthritis   ? Atrial fibrillation ([place])   ? CAD (coronary artery disease)   ? a. history of cardiac arrest 1993;  b. s/p LAD/LCX stenting in 2003;  c. 07/2011 Cath: LM nl, LAD patent stent, D1 80ost, LCX patent stent, RCA min irregs;  d. 04/2012 MV: EF 66%, no ishcemia;  e. 12/2013 Echo: Ef 55%, no rwma, Gr 1 DD, triv AI/MR, mildly dil LA;  f. 12/2013 Lexi MV: intermediate risk - apical ischemia and inf/infsept fixed defect, ? artifactual.  ? CAD in native artery 12/31/2013  ? Previous stents to LAD  and Ramus patent on cath today 12/31/13 also There is severe disease in the ostial first diagonal which is unchanged from most recent cardiac catheterization. The right coronary artery could not be engaged selectively but nonselective angiography showed no significant disease in the proximal and midsegment.      ? Cataract   ? DENIES  ? Chronic back pain   ? Chronic sinus bradycardia   ? CKD (chronic kidney disease), stage III ([place])   ? Clotting disorder ([place])   ? DVT  ? COLITIS 12/02/2007  ? Qualifier: Diagnosis of  By: [name], [name]    ? Diabetes mellitus without complication ([place])   ? DENIES  ? Diverticulosis of colon (without mention of hemorrhage) 2007  ? Colonoscopy   ? Esophageal stricture   ? a. 2012 s/p dil.  ? Esophagitis, unspecified   ?  a. 2012 EGD  ? EXTERNAL HEMORRHOIDS   ? GERD (gastroesophageal reflux disease)   ? omeprazole  ? H/O hiatal hernia   ? Hiatal hernia   ? a. 2012 EGD.  ? History of DVT in the past, not on Coumadin now   ? left  leg  ? HYPERCHOLESTEROLEMIA   ? Hypertension   ? ICD (implantable cardiac defibrillator) in place   ? a. s/p initial ICD in 1993 in setting of cardiac arrest;  b. 01/2007 gen change: Guidant T135 Vitality DS VR single lead ICD.  ? Macular degeneration   ? gets injecton in eye every 5 weeks- last injection - 05/03/2013   ? Myocardial infarction [facility]) 1993  ? Near syncope 05/22/2015  ? Nephrolithiasis, just saw Dr [name]   ? Orthostatic hypotension   ? Peripheral vascular disease ([place])   ? ???  ? RECTAL BLEEDING 12/03/2007  ? Qualifier: Diagnosis of  By: Sharlett Iles MD Byrd Hesselbach   ? Unspecified gastritis and gastroduodenitis without mention of hemorrhage   ? a. 2003 EGD->not noted on 2012 EGD.  ? Unstable angina, neg MI, cath stable.maybe GI 12/30/2013  ? ? ?Past Surgical History:  ?Procedure Laterality Date  ? BREAST BIOPSY Left 11/2013  ? CARDIAC CATHETERIZATION    ? CARDIAC CATHETERIZATION  01/01/15  ? patent stents -there is severe disease in  ostial 1st diag but without change.   ? CARDIAC DEFIBRILLATOR PLACEMENT  02/05/2007  ? Guidant  ? CARDIOVASCULAR STRESS TEST  12/30/2013  ? abnormal  ? CARDIOVERSION N/A 02/13/2019  ? Procedure: CARDIOVERSION

## 2021-05-24 ENCOUNTER — Other Ambulatory Visit: Payer: Self-pay | Admitting: Gastroenterology

## 2021-05-24 ENCOUNTER — Other Ambulatory Visit: Payer: Medicare Other

## 2021-05-24 DIAGNOSIS — I70203 Unspecified atherosclerosis of native arteries of extremities, bilateral legs: Secondary | ICD-10-CM | POA: Diagnosis not present

## 2021-05-24 DIAGNOSIS — B351 Tinea unguium: Secondary | ICD-10-CM | POA: Diagnosis not present

## 2021-05-24 DIAGNOSIS — L84 Corns and callosities: Secondary | ICD-10-CM | POA: Diagnosis not present

## 2021-05-24 DIAGNOSIS — K625 Hemorrhage of anus and rectum: Secondary | ICD-10-CM | POA: Diagnosis not present

## 2021-05-24 DIAGNOSIS — M79676 Pain in unspecified toe(s): Secondary | ICD-10-CM | POA: Diagnosis not present

## 2021-05-25 LAB — CBC
Hematocrit: 32.8 % — ABNORMAL LOW (ref 34.0–46.6)
Hemoglobin: 11 g/dL — ABNORMAL LOW (ref 11.1–15.9)
MCH: 31.9 pg (ref 26.6–33.0)
MCHC: 33.5 g/dL (ref 31.5–35.7)
MCV: 95 fL (ref 79–97)
Platelets: 259 10*3/uL (ref 150–450)
RBC: 3.45 x10E6/uL — ABNORMAL LOW (ref 3.77–5.28)
RDW: 13.7 % (ref 11.7–15.4)
WBC: 5.5 10*3/uL (ref 3.4–10.8)

## 2021-05-31 ENCOUNTER — Telehealth: Payer: Self-pay | Admitting: Cardiovascular Disease

## 2021-05-31 NOTE — Telephone Encounter (Signed)
Her defibrillator is set at a lower rate limit of 50 bpm for pacing.  For her, heart rate around 50 bpm is normal. ? ?

## 2021-05-31 NOTE — Telephone Encounter (Signed)
STAT if HR is under 50 or over 120 ?(normal HR is 60-100 beats per minute) ? ?What is your heart rate? 48 ? ?Do you have a log of your heart rate readings (document readings)? No ? ?Do you have any other symptoms? Feels faint- pt states HR has been low for the last 2 days. Pt states that it never gets above 52. Please advise ? ? ?

## 2021-05-31 NOTE — Telephone Encounter (Signed)
Patient called in to report a pulse rate in the 50s. Her BP is 156/85. She was SOB last night and feels lightheaded. She is not taking amlodipine for her BP. I recommended that she take it as prescribed, as long as SBP not less than 130 and DBP not less than 60. She is on other meds as prescribed. She denies palipatations. I recommended that she have someone take her to the ED due to SOB and dizziness.  ?

## 2021-05-31 NOTE — Telephone Encounter (Signed)
Patient informed of the following note from Dr. Sallyanne Kuster. ? ?Her defibrillator is set at a lower rate limit of 50 bpm for pacing.  For her, heart rate around 50 bpm is normal.  ?  ? ?She had no other questions or concerns at this time. ? ? ?

## 2021-06-02 ENCOUNTER — Other Ambulatory Visit: Payer: Self-pay | Admitting: Family Medicine

## 2021-06-02 DIAGNOSIS — I5032 Chronic diastolic (congestive) heart failure: Secondary | ICD-10-CM | POA: Diagnosis not present

## 2021-06-02 DIAGNOSIS — R809 Proteinuria, unspecified: Secondary | ICD-10-CM | POA: Diagnosis not present

## 2021-06-02 DIAGNOSIS — I129 Hypertensive chronic kidney disease with stage 1 through stage 4 chronic kidney disease, or unspecified chronic kidney disease: Secondary | ICD-10-CM | POA: Diagnosis not present

## 2021-06-02 DIAGNOSIS — N1832 Chronic kidney disease, stage 3b: Secondary | ICD-10-CM | POA: Diagnosis not present

## 2021-06-14 ENCOUNTER — Encounter (INDEPENDENT_AMBULATORY_CARE_PROVIDER_SITE_OTHER): Payer: Self-pay

## 2021-06-14 DIAGNOSIS — Z961 Presence of intraocular lens: Secondary | ICD-10-CM | POA: Diagnosis not present

## 2021-06-14 DIAGNOSIS — H3561 Retinal hemorrhage, right eye: Secondary | ICD-10-CM | POA: Diagnosis not present

## 2021-06-14 DIAGNOSIS — H04123 Dry eye syndrome of bilateral lacrimal glands: Secondary | ICD-10-CM | POA: Diagnosis not present

## 2021-06-14 DIAGNOSIS — H40052 Ocular hypertension, left eye: Secondary | ICD-10-CM | POA: Diagnosis not present

## 2021-06-14 DIAGNOSIS — H353211 Exudative age-related macular degeneration, right eye, with active choroidal neovascularization: Secondary | ICD-10-CM | POA: Diagnosis not present

## 2021-06-14 DIAGNOSIS — H43813 Vitreous degeneration, bilateral: Secondary | ICD-10-CM | POA: Diagnosis not present

## 2021-06-14 DIAGNOSIS — H43812 Vitreous degeneration, left eye: Secondary | ICD-10-CM | POA: Diagnosis not present

## 2021-06-14 DIAGNOSIS — H2512 Age-related nuclear cataract, left eye: Secondary | ICD-10-CM | POA: Diagnosis not present

## 2021-06-14 DIAGNOSIS — H353222 Exudative age-related macular degeneration, left eye, with inactive choroidal neovascularization: Secondary | ICD-10-CM | POA: Diagnosis not present

## 2021-06-21 ENCOUNTER — Other Ambulatory Visit: Payer: Self-pay | Admitting: Family Medicine

## 2021-06-21 ENCOUNTER — Encounter (INDEPENDENT_AMBULATORY_CARE_PROVIDER_SITE_OTHER): Payer: Self-pay | Admitting: Ophthalmology

## 2021-06-21 ENCOUNTER — Ambulatory Visit (INDEPENDENT_AMBULATORY_CARE_PROVIDER_SITE_OTHER): Payer: Medicare Other | Admitting: Ophthalmology

## 2021-06-21 DIAGNOSIS — H353124 Nonexudative age-related macular degeneration, left eye, advanced atrophic with subfoveal involvement: Secondary | ICD-10-CM | POA: Diagnosis not present

## 2021-06-21 DIAGNOSIS — I639 Cerebral infarction, unspecified: Secondary | ICD-10-CM

## 2021-06-21 DIAGNOSIS — F411 Generalized anxiety disorder: Secondary | ICD-10-CM

## 2021-06-21 DIAGNOSIS — R630 Anorexia: Secondary | ICD-10-CM

## 2021-06-21 DIAGNOSIS — H353114 Nonexudative age-related macular degeneration, right eye, advanced atrophic with subfoveal involvement: Secondary | ICD-10-CM | POA: Diagnosis not present

## 2021-06-21 DIAGNOSIS — H353211 Exudative age-related macular degeneration, right eye, with active choroidal neovascularization: Secondary | ICD-10-CM

## 2021-06-21 DIAGNOSIS — F321 Major depressive disorder, single episode, moderate: Secondary | ICD-10-CM

## 2021-06-21 MED ORDER — BEVACIZUMAB 2.5 MG/0.1ML IZ SOSY
2.5000 mg | PREFILLED_SYRINGE | INTRAVITREAL | Status: AC | PRN
  Administered 2021-06-21: 2.5 mg via INTRAVITREAL

## 2021-06-21 NOTE — Progress Notes (Signed)
06/21/2021     CHIEF COMPLAINT Patient presents for  Chief Complaint  Patient presents with   Macular Degeneration      HISTORY OF PRESENT ILLNESS: [name] is a 88 y.o. female who presents to the clinic today for:   HPI   WIP- Subretinal Hemorrhage OD/ Ocular Hypertension OS, FU per [place]. Patient was seen by Dr. Clent Jacks 06/14/2021. Dr Katy Fitch Prescribed Latanoprost QHS OS until follow up visit with him in 3 weeks from 06/14/2021. "In my left eye I can tell it is getting bad. I had a right stroke. I think it effected my left side. It got so bad I went to Dr. Katy Fitch. He said I was having pressure in my left eye and something about bleeding behind my eye." Patient reports she notices a big change in clarity. Patient allergy to Ciprofloxacin. Last edited by Laurin Coder on 06/21/2021  2:07 PM.      Referring physician: [name], DO [place],  [place] [id number]  HISTORICAL INFORMATION:   Selected notes from the MEDICAL RECORD NUMBER    Lab Results  Component Value Date   HGBA1C 6.5 (H) 02/04/2021     CURRENT MEDICATIONS: Current Outpatient Medications (Ophthalmic Drugs)  Medication Sig   carboxymethylcellulose (REFRESH PLUS) 0.5 % SOLN Place 1 drop into both eyes 3 (three) times daily as needed (dry/irritated eyes.).   No current facility-administered medications for this visit. (Ophthalmic Drugs)   Current Outpatient Medications (Other)  Medication Sig   acetaminophen (TYLENOL) 500 MG tablet Take 1 tablet (500 mg total) by mouth every 6 (six) hours as needed for mild pain, fever or headache.   Alpha-Lipoic Acid 600 MG TABS Take 600 mg by mouth daily. For neuropathy in legs   amiodarone (PACERONE) 200 MG tablet Take 1 tablet (200 mg total) by mouth daily.   amLODipine (NORVASC) 2.5 MG tablet Take 1 tablet (2.5 mg total) by mouth daily. Do NOT take if BP drops below 130 on top or below 60 on bottom.   aspirin EC 81 MG EC tablet Take 1  tablet (81 mg total) by mouth daily. Swallow whole.   busPIRone (BUSPAR) 15 MG tablet Take 1 tablet (15 mg total) by mouth 2 (two) times daily.   cephALEXin (KEFLEX) 500 MG capsule Take 1 capsule (500 mg total) by mouth 2 (two) times daily. (Patient not taking: Reported on 05/19/2021)   Cholecalciferol (VITAMIN D) 50 MCG (2000 UT) CAPS Take 2,000 Units by mouth every morning.   ELIQUIS 2.5 MG TABS tablet TAKE (1) TABLET TWICE A DAY.   feeding supplement (ENSURE ENLIVE / ENSURE PLUS) LIQD Take 237 mLs by mouth 2 (two) times daily between meals.   furosemide (LASIX) 20 MG tablet Take 20 mg by mouth as needed.   gabapentin (NEURONTIN) 100 MG capsule TAKE UP TO 3 CAPSULES AT BEDTIME AS NEEDED   isosorbide mononitrate (IMDUR) 60 MG 24 hr tablet Take 1 tablet (60 mg total) by mouth daily.   levothyroxine (SYNTHROID) 100 MCG tablet TAKE ONE TABLET BY MOUTH EVERY MORNING   MELATONIN PO Take 3 mg by mouth at bedtime.   metoprolol succinate (TOPROL-XL) 25 MG 24 hr tablet Take 0.5 tablets (12.5 mg total) by mouth daily. Take with or immediately following a meal. (Patient taking differently: Take 12.5 mg by mouth 2 (two) times daily. Take with or immediately following a meal.)   Multiple Vitamins-Minerals (CENTRUM SILVER ULTRA WOMENS PO) Take 1 tablet by  mouth every morning.   nitroGLYCERIN (NITROSTAT) 0.4 MG SL tablet PLACE 1 TABLET UNDER THE TONGUE AT ONSET OF CHEST PAIN EVERY 5 MINTUES UP TO 3 TIMES AS NEEDED   pantoprazole (PROTONIX) 40 MG tablet TAKE 1 TABLET ONCE DAILY   potassium chloride SA (KLOR-CON) 20 MEQ tablet Take 20 mEq by mouth 3 (three) times a week.   rosuvastatin (CRESTOR) 10 MG tablet TAKE 1 TABLET DAILY   valsartan (DIOVAN) 40 MG tablet Take 0.5 tablets by mouth daily.   No current facility-administered medications for this visit. (Other)      REVIEW OF SYSTEMS: ROS   Negative for: Constitutional, Gastrointestinal, Neurological, Skin, Genitourinary, Musculoskeletal, HENT, Endocrine,  Cardiovascular, Eyes, Respiratory, Psychiatric, Allergic/Imm, Heme/Lymph Last edited by Hurman Horn, MD on 06/21/2021  2:22 PM.       ALLERGIES Allergies  Allergen Reactions   Sotalol Other (See Comments)    Torsades    Tramadol Other (See Comments)    Hallucination, bad-dream   Actos [Pioglitazone] Swelling   Adhesive [Tape] Other (See Comments)    Causes sores   Atorvastatin Other (See Comments)    myalgia   Bactrim [Sulfamethoxazole-Trimethoprim] Itching and Rash    Itchy rash   Ciprofloxacin Nausea Only   Contrast Media [Iodinated Contrast Media] Other (See Comments)    Headache (no action/pre-med required)   Latex Rash   Pedi-Pre Tape Spray [Wound Dressing Adhesive] Other (See Comments)    Causes sores   Sulfa Antibiotics Other (See Comments)    Causes sores    PAST MEDICAL HISTORY Past Medical History:  Diagnosis Date   AICD (automatic cardioverter/defibrillator) present    Allergy    SESONAL   Aneurysm of infrarenal abdominal aorta ([place])    Anxiety    ARTHRITIS    Arthritis    Atrial fibrillation ([place])    CAD (coronary artery disease)    a. history of cardiac arrest 1993;  b. s/p LAD/LCX stenting in 2003;  c. 07/2011 Cath: LM nl, LAD patent stent, D1 80ost, LCX patent stent, RCA min irregs;  d. 04/2012 MV: EF 66%, no ishcemia;  e. 12/2013 Echo: Ef 55%, no rwma, Gr 1 DD, triv AI/MR, mildly dil LA;  f. 12/2013 Lexi MV: intermediate risk - apical ischemia and inf/infsept fixed defect, ? artifactual.   CAD in native artery 12/31/2013   Previous stents to LAD and Ramus patent on cath today 12/31/13 also There is severe disease in the ostial first diagonal which is unchanged from most recent cardiac catheterization. The right coronary artery could not be engaged selectively but nonselective angiography showed no significant disease in the proximal and midsegment.       Cataract    DENIES   Chronic back pain    Chronic sinus bradycardia    CKD (chronic kidney  disease), stage III (HCC)    Clotting disorder ([place])    DVT   COLITIS 12/02/2007   Qualifier: Diagnosis of  By: [name], [name]     Diabetes mellitus without complication ([place])    DENIES   Diverticulosis of colon (without mention of hemorrhage) 2007   Colonoscopy    Esophageal stricture    a. 2012 s/p dil.   Esophagitis, unspecified    a. 2012 EGD   EXTERNAL HEMORRHOIDS    GERD (gastroesophageal reflux disease)    omeprazole   H/O hiatal hernia    Hiatal hernia    a. 2012 EGD.   History of DVT in the past, not on  Coumadin now    left  leg   HYPERCHOLESTEROLEMIA    Hypertension    ICD (implantable cardiac defibrillator) in place    a. s/p initial ICD in 1993 in setting of cardiac arrest;  b. 01/2007 gen change: Guidant T135 Vitality DS VR single lead ICD.   Macular degeneration    gets injecton in eye every 5 weeks- last injection - 05/03/2013    Myocardial infarction [place]) 1993   Near syncope 05/22/2015   Nephrolithiasis, just saw Dr [name]    Orthostatic hypotension    Peripheral vascular disease ([place])    ???   RECTAL BLEEDING 12/03/2007   Qualifier: Diagnosis of  By: [name]    Unspecified gastritis and gastroduodenitis without mention of hemorrhage    a. 2003 EGD->not noted on 2012 EGD.   Unstable angina, neg MI, cath stable.maybe GI 12/30/2013   Past Surgical History:  Procedure Laterality Date   BREAST BIOPSY Left 11/2013   CARDIAC CATHETERIZATION     CARDIAC CATHETERIZATION  01/01/15   patent stents -there is severe disease in ostial 1st diag but without change.    CARDIAC DEFIBRILLATOR PLACEMENT  02/05/2007   Guidant   CARDIOVASCULAR STRESS TEST  12/30/2013   abnormal   CARDIOVERSION N/A 02/13/2019   Procedure: CARDIOVERSION;  Surgeon: Sanda Klein, MD;  Location: [facility];  Service: Cardiovascular;  Laterality: N/A;   CATARACT EXTRACTION Right    Dr. Katy Fitch   COLONOSCOPY     COLONOSCOPY WITH PROPOFOL N/A 03/30/2020    Procedure: COLONOSCOPY WITH PROPOFOL;  Surgeon: Harvel Quale, MD;  [facility];  Service: Gastroenterology;  Laterality: N/A;   coronary stents      CYSTOSCOPY WITH URETEROSCOPY Right 05/20/2013   Procedure: CYSTOSCOPY, RIGHT URETEROSCOPY STONE EXTRACTION, Insertion of right DOUBLE J STENT ;  Surgeon: Irine Seal, MD;  Location: [facility];  Service: Urology;  Laterality: Right;   CYSTOSCOPY WITH URETEROSCOPY AND STENT PLACEMENT N/A 06/03/2013   Procedure: SECOND LOOK CYSTOSCOPY WITH URETEROSCOPY  HOLMIUM LASER LITHO AND STONE EXTRACTION Sammie Bench ;  Surgeon: Malka So, MD;  Location: [facility];  Service: Urology;  Laterality: N/A;   ESOPHAGOGASTRODUODENOSCOPY (EGD) WITH PROPOFOL N/A 11/20/2019   Procedure: ESOPHAGOGASTRODUODENOSCOPY (EGD) WITH PROPOFOL;  Surgeon: Daneil Dolin, MD;  [facility]: [facility];  Service: Endoscopy;  Laterality: N/A;   GIVENS CAPSULE STUDY N/A 10/30/2016   Procedure: GIVENS CAPSULE STUDY;  Surgeon: Irene Shipper, MD;  Location: [facility] ENDOSCOPY;  Service: Endoscopy;  Laterality: N/A;  NEEDS TO BE ADMITTED FOR OBSERVATION PT. HAS DEFIB   HEMORROIDECTOMY  80's   HOLMIUM LASER APPLICATION Right 02/19/5571   Procedure: HOLMIUM LASER APPLICATION;  Surgeon: Irine Seal, MD;  Location: [facility];  Service: Urology;  Laterality: Right;   HYSTEROSCOPY WITH D & C  09/02/2010   Procedure: DILATATION AND CURETTAGE (D&C) /HYSTEROSCOPY;  Surgeon: Margarette Asal;  Location: [facility];  Service: Gynecology;  Laterality: N/A;  Dilation and Curettage with Hysteroscopy and Polypectomy   IMPLANTABLE CARDIOVERTER DEFIBRILLATOR (ICD) GENERATOR CHANGE N/A 02/10/2014   Procedure: ICD GENERATOR CHANGE;  Surgeon: Sanda Klein, MD;  Location: [facility];  Service: Cardiovascular;  Laterality: N/A;   LEFT HEART CATHETERIZATION WITH CORONARY ANGIOGRAM N/A 07/28/2011   Procedure: LEFT HEART CATHETERIZATION WITH CORONARY ANGIOGRAM;  Surgeon: Lorretta Harp, MD;  Location: [facility] C[facility] LAB;   Service: Cardiovascular;  Laterality: N/A;   LEFT HEART CATHETERIZATION WITH CORONARY ANGIOGRAM N/A 12/31/2013   Procedure:  LEFT HEART CATHETERIZATION WITH CORONARY ANGIOGRAM;  Surgeon: Wellington Hampshire, MD;  Location: [facility] C[facility] LAB;  Service: Cardiovascular;  Laterality: N/A;    FAMILY HISTORY Family History  Problem Relation Age of Onset   Stroke Mother    Other Mother        brain tumor   Other Father        MI   Heart attack Father    Stroke Sister    Macular degeneration Sister    Diabetes Daughter    Cancer Daughter        ovarian   Colon polyps Daughter    Atrial fibrillation Sister    Hyperlipidemia Sister    Osteoporosis Sister    Stroke Sister    Uterine cancer Sister    Heart attack Brother    Heart disease Brother    Asthma Brother    Breast cancer Neg Hx     SOCIAL HISTORY Social History   Tobacco Use   Smoking status: Never   Smokeless tobacco: Never  Vaping Use   Vaping Use: Never used  Substance Use Topics   Alcohol use: No   Drug use: No         OPHTHALMIC EXAM:  Base Eye Exam     Visual Acuity (ETDRS)       Right Left   Dist [place] 20/100 -2 CF at 2'   Dist ph [place] NI 20/250         Tonometry (Tonopen, 2:11 PM)       Right Left   Pressure 14 18         Pupils       Pupils Dark Light React APD   Right PERRL 4 4 Minimal None   Left PERRL 4 4 Minimal None         Extraocular Movement       Right Left    Full Full         Neuro/Psych     Oriented x3: Yes   Mood/Affect: Normal         Dilation     Both eyes: 1.0% Mydriacyl, 2.5% Phenylephrine @ 2:11 PM           Slit Lamp and Fundus Exam     External Exam       Right Left   External Normal Normal         Slit Lamp Exam       Right Left   Lids/Lashes Normal Normal   Conjunctiva/Sclera White and quiet White and quiet   Cornea Clear Clear   Anterior Chamber Deep and quiet Deep and quiet   Iris Round and reactive Round and reactive   Lens  Posterior chamber intraocular lens Posterior chamber intraocular lens   Anterior Vitreous Normal Normal         Fundus Exam       Right Left   Posterior Vitreous Central vitreous floaters, Posterior vitreous detachment Posterior vitreous detachment   Disc Normal Normal   C/D Ratio 0.15 0.15   Macula Atrophy central atrophy in the FAZ, Mottling, Retinal pigment epithelial mottling with subretinal red telangiectasia new hemorrhage, perifoveal mostly nasal aspect to FAZ Geographic atrophy,  Hard drusen, Advanced age related macular degeneration   Vessels Normal, , no DR Normal,, no DR   Periphery Normal Normal            IMAGING AND PROCEDURES  Imaging and Procedures for 06/21/21  OCT, Retina - OU -  Both Eyes       Right Eye Quality was good. Scan locations included subfoveal. Central Foveal Thickness: 323. Progression has been stable. Findings include subretinal scarring, abnormal foveal contour, subretinal hyper-reflective material, outer retinal atrophy, central retinal atrophy, subretinal fluid, intraretinal fluid.   Left Eye Quality was borderline. Scan locations included subfoveal. Progression has been stable. Findings include no IRF, no SRF, abnormal foveal contour, subretinal hyper-reflective material, central retinal atrophy, outer retinal atrophy, inner retinal atrophy.   Notes OD, no signs of active disease for over a year.  Now with new intraretinal subretinal fluid  Bilateral dry ARMD, OS, now with effective therapy upcoming bilateral subfoveal disciform limit vision as well but no active disease at this time.     Color Fundus Photography Optos - OU - Both Eyes       Right Eye Progression has worsened. Disc findings include normal observations. Macula : geographic atrophy, hemorrhage. Vessels : normal observations. Periphery : normal observations.   Left Eye Progression has been stable. Disc findings include normal observations. Macula : geographic atrophy.  Vessels : normal observations. Periphery : normal observations.   Notes OD with new perifoveal hemorrhage above and below the foveal region.  Central foveal RPE appears intact still.  Geographic atrophy temporally.  OS extensive geographic atrophy in the macular subfoveal region unchanged over the last 5 years     Intravitreal Injection, Pharmacologic Agent - OD - Right Eye       Time Out 06/21/2021. 2:38 PM. Confirmed correct patient, procedure, site, and patient consented.   Anesthesia Topical anesthesia was used. Anesthetic medications included Lidocaine 4%.   Procedure Preparation included Tobramycin 0.3%, 10% betadine to eyelids, 5% betadine to ocular surface. A supplied needle was used.   Injection: 2.5 mg bevacizumab 2.5 MG/0.1ML   Route: Intravitreal, Site: Right Eye   NDC: [phone], Lot: [id number], Expiration date: 08/14/2021   Post-op Post injection exam found visual acuity of at least counting fingers. The patient tolerated the procedure well. There were no complications. The patient received written and verbal post procedure care education. Post injection medications were not given.              ASSESSMENT/PLAN:  Advanced nonexudative age-related macular degeneration of right eye with subfoveal involvement The nature of dry age related macular degeneration was discussed with the patient as well as its possible conversion to wet. The results of the AREDS 2 study was discussed with the patient. A diet rich in dark leafy green vegetables was advised and specific recommendations were made regarding supplements with AREDS 2 formulation . Control of hypertension and serum cholesterol may slow the disease. Smoking cessation is mandatory to slow the disease and diminish the risk of progressing to wet age related macular degeneration. The patient was instructed in the use of an [place] and was told to return immediately for any changes in the Grid. Stressed to the patient  do not rub eyes The nature of age--related macular degeneration was discussed with the patient as well as the distinction between dry and wet types. Checking an [place] Grid daily with advice to return immediately should a distortion develop, was given to the patient. The patient 's smoking status now and in the past was determined and advice based on the AREDS study was provided regarding the consumption of antioxidant supplements. AREDS 2 vitamin formulation was recommended. Consumption of dark leafy vegetables and fresh fruits of various colors was recommended. Treatment modalities for wet macular degeneration particularly the  use of intravitreal injections of anti-blood vessel growth factors was discussed with the patient. Avastin, Lucentis, and Eylea are the available options. On occasion, therapy includes the use of photodynamic therapy and thermal laser. Stressed to the patient do not rub eyes.  Patient was advised to check [place] Grid daily and return immediately if changes are noted. Instructions on using the grid were given to the patient. All patient questions were answered.  OD may be a candidate for Syfovre in the near future.  Exudative age-related macular degeneration of right eye with active choroidal neovascularization (HCC) OD with newly active subretinal CNVM with hemorrhage.  Risk of vision loss.  We will need to proceed promptly with treatment, will commence with intravitreal Avastin today.  Risk benefits reviewed with the patient.  Will need scheduled follow-up again in 5 weeks.      ICD-10-CM   1. Exudative age-related macular degeneration of right eye with active choroidal neovascularization (HCC)  H35.3211 OCT, Retina - OU - Both Eyes    Color Fundus Photography Optos - OU - Both Eyes    Intravitreal Injection, Pharmacologic Agent - OD - Right Eye    bevacizumab (AVASTIN) SOSY 2.5 mg    2. Advanced nonexudative age-related macular degeneration of left eye with subfoveal  involvement  H35.3124 OCT, Retina - OU - Both Eyes    Color Fundus Photography Optos - OU - Both Eyes    3. Advanced nonexudative age-related macular degeneration of right eye with subfoveal involvement  H35.3114       OD, with new onset subretinal hemorrhage from CNVM.  Threatening vision loss.  Restart intravitreal Avastin OD promptly today  2.  Perifoveal geographic atrophy OD, may be a candidate for Syfovre in the future  3.  OS, extensive subfoveal atrophy from dry AMD.  Will observe for now  Ophthalmic Meds Ordered this visit:  Meds ordered this encounter  Medications   bevacizumab (AVASTIN) SOSY 2.5 mg       Return in about 5 weeks (around 07/26/2021) for dilate, OD, AVASTIN OCT.  There are no Patient Instructions on file for this visit.   Explained the diagnoses, plan, and follow up with the patient and they expressed understanding.  Patient expressed understanding of the importance of proper follow up care.   Clent Demark Bettejane Leavens M.D. Diseases & Surgery of the Retina and Vitreous [facility]ina [facility] Di[facility] [place] 06/21/21     Abbreviations: M myopia (nearsighted); A astigmatism; H hyperopia (farsighted); P presbyopia; Mrx spectacle prescription;  CTL contact lenses; OD right eye; OS left eye; OU both eyes  XT exotropia; ET esotropia; PEK punctate epithelial keratitis; PEE punctate epithelial erosions; DES dry eye syndrome; MGD meibomian gland dysfunction; ATs artificial tears; PFAT's preservative free artificial tears; [place] nuclear sclerotic cataract; PSC posterior subcapsular cataract; ERM epi-retinal membrane; PVD posterior vitreous detachment; RD retinal detachment; DM diabetes mellitus; DR diabetic retinopathy; NPDR non-proliferative diabetic retinopathy; PDR proliferative diabetic retinopathy; CSME clinically significant macular edema; DME diabetic macular edema; dbh dot blot hemorrhages; CWS cotton wool spot; POAG primary open angle glaucoma; C/D cup-to-disc ratio; HVF humphrey  visual field; GVF goldmann visual field; OCT optical coherence tomography; IOP intraocular pressure; BRVO Branch retinal vein occlusion; CRVO central retinal vein occlusion; CRAO central retinal artery occlusion; BRAO branch retinal artery occlusion; RT retinal tear; SB scleral buckle; PPV pars plana vitrectomy; VH Vitreous hemorrhage; PRP panretinal laser photocoagulation; IVK intravitreal kenalog; VMT vitreomacular traction; MH Macular hole;  NVD neovascularization of the disc; NVE neovascularization elsewhere; AREDS  age related eye disease study; ARMD age related macular degeneration; POAG primary open angle glaucoma; EBMD epithelial/anterior basement membrane dystrophy; ACIOL anterior chamber intraocular lens; IOL intraocular lens; PCIOL posterior chamber intraocular lens; Phaco/IOL phacoemulsification with intraocular lens placement; [place] photorefractive keratectomy; LASIK laser assisted in situ keratomileusis; HTN hypertension; DM diabetes mellitus; COPD chronic obstructive pulmonary disease

## 2021-06-21 NOTE — Assessment & Plan Note (Addendum)
The nature of dry age related macular degeneration was discussed with the patient as well as its possible conversion to wet. The results of the AREDS 2 study was discussed with the patient. A diet rich in dark leafy green vegetables was advised and specific recommendations were made regarding supplements with AREDS 2 formulation . Control of hypertension and serum cholesterol may slow the disease. Smoking cessation is mandatory to slow the disease and diminish the risk of progressing to wet age related macular degeneration. The patient was instructed in the use of an [place] and was told to return immediately for any changes in the Grid. Stressed to the patient do not rub eyes The nature of age--related macular degeneration was discussed with the patient as well as the distinction between dry and wet types. Checking an [place] Grid daily with advice to return immediately should a distortion develop, was given to the patient. The patient 's smoking status now and in the past was determined and advice based on the AREDS study was provided regarding the consumption of antioxidant supplements. AREDS 2 vitamin formulation was recommended. Consumption of dark leafy vegetables and fresh fruits of various colors was recommended. Treatment modalities for wet macular degeneration particularly the use of intravitreal injections of anti-blood vessel growth factors was discussed with the patient. Avastin, Lucentis, and Eylea are the available options. On occasion, therapy includes the use of photodynamic therapy and thermal laser. Stressed to the patient do not rub eyes.  Patient was advised to check [place]ler Grid daily and return immediately if changes are noted. Instructions on using the grid were given to the patient. All patient questions were answered.  OD may be a candidate for Syfovre in the near future.

## 2021-06-21 NOTE — Assessment & Plan Note (Signed)
OD with newly active subretinal CNVM with hemorrhage.  Risk of vision loss.  We will need to proceed promptly with treatment, will commence with intravitreal Avastin today.  Risk benefits reviewed with the patient.  Will need scheduled follow-up again in 5 weeks.

## 2021-07-05 DIAGNOSIS — H40052 Ocular hypertension, left eye: Secondary | ICD-10-CM | POA: Diagnosis not present

## 2021-07-07 ENCOUNTER — Telehealth: Payer: Self-pay | Admitting: Family Medicine

## 2021-07-07 NOTE — Telephone Encounter (Signed)
Patient aware.

## 2021-07-07 NOTE — Telephone Encounter (Signed)
Generally we wait 3-6 months before repeating.

## 2021-07-11 ENCOUNTER — Ambulatory Visit (INDEPENDENT_AMBULATORY_CARE_PROVIDER_SITE_OTHER): Payer: Medicare Other | Admitting: Family Medicine

## 2021-07-11 ENCOUNTER — Encounter: Payer: Self-pay | Admitting: Family Medicine

## 2021-07-11 VITALS — BP 156/76 | HR 51 | Temp 97.5°F | Ht 63.0 in | Wt 127.1 lb

## 2021-07-11 DIAGNOSIS — I10 Essential (primary) hypertension: Secondary | ICD-10-CM

## 2021-07-11 DIAGNOSIS — R42 Dizziness and giddiness: Secondary | ICD-10-CM

## 2021-07-11 MED ORDER — MECLIZINE HCL 12.5 MG PO TABS: 12.5000 mg | ORAL_TABLET | Freq: Three times a day (TID) | ORAL | 0 refills | Status: DC | PRN

## 2021-07-12 LAB — CBC WITH DIFFERENTIAL/PLATELET
Basophils Absolute: 0.1 10*3/uL (ref 0.0–0.2)
Basos: 1 %
EOS (ABSOLUTE): 0.4 10*3/uL (ref 0.0–0.4)
Eos: 7 %
Hematocrit: 33.2 % — ABNORMAL LOW (ref 34.0–46.6)
Hemoglobin: 10.8 g/dL — ABNORMAL LOW (ref 11.1–15.9)
Immature Grans (Abs): 0 10*3/uL (ref 0.0–0.1)
Immature Granulocytes: 1 %
Lymphocytes Absolute: 1.3 10*3/uL (ref 0.7–3.1)
Lymphs: 24 %
MCH: 30.3 pg (ref 26.6–33.0)
MCHC: 32.5 g/dL (ref 31.5–35.7)
MCV: 93 fL (ref 79–97)
Monocytes Absolute: 0.7 10*3/uL (ref 0.1–0.9)
Monocytes: 12 %
Neutrophils Absolute: 3.1 10*3/uL (ref 1.4–7.0)
Neutrophils: 55 %
Platelets: 258 10*3/uL (ref 150–450)
RBC: 3.57 x10E6/uL — ABNORMAL LOW (ref 3.77–5.28)
RDW: 13.2 % (ref 11.7–15.4)
WBC: 5.6 10*3/uL (ref 3.4–10.8)

## 2021-07-12 LAB — BMP8+EGFR
BUN/Creatinine Ratio: 9 — ABNORMAL LOW (ref 12–28)
BUN: 11 mg/dL (ref 8–27)
CO2: 24 mmol/L (ref 20–29)
Calcium: 9.5 mg/dL (ref 8.7–10.3)
Chloride: 102 mmol/L (ref 96–106)
Creatinine, Ser: 1.21 mg/dL — ABNORMAL HIGH (ref 0.57–1.00)
Glucose: 150 mg/dL — ABNORMAL HIGH (ref 70–99)
Potassium: 4.3 mmol/L (ref 3.5–5.2)
Sodium: 141 mmol/L (ref 134–144)
eGFR: 43 mL/min/{1.73_m2} — ABNORMAL LOW (ref >59)

## 2021-07-20 ENCOUNTER — Ambulatory Visit (INDEPENDENT_AMBULATORY_CARE_PROVIDER_SITE_OTHER): Payer: Medicare Other

## 2021-07-20 DIAGNOSIS — Z95810 Presence of automatic (implantable) cardiac defibrillator: Secondary | ICD-10-CM | POA: Diagnosis not present

## 2021-07-23 LAB — CUP PACEART REMOTE DEVICE CHECK
Battery Remaining Longevity: 126 mo
Battery Remaining Percentage: 100 %
Brady Statistic RV Percent Paced: 0 %
Date Time Interrogation Session: 20230706123100
HighPow Impedance: 37 Ohm
Implantable Lead Implant Date: 20090120
Implantable Lead Location: 753860
Implantable Lead Model: 157
Implantable Lead Serial Number: 138237
Implantable Pulse Generator Implant Date: 20160126
Lead Channel Impedance Value: 503 Ohm
Lead Channel Pacing Threshold Amplitude: 0.9 V
Lead Channel Pacing Threshold Pulse Width: 0.5 ms
Lead Channel Setting Pacing Amplitude: 2.2 V
Lead Channel Setting Pacing Pulse Width: 0.5 ms
Lead Channel Setting Sensing Sensitivity: 0.6 mV
Pulse Gen Serial Number: 191956

## 2021-07-26 ENCOUNTER — Encounter (INDEPENDENT_AMBULATORY_CARE_PROVIDER_SITE_OTHER): Payer: Medicare Other | Admitting: Ophthalmology

## 2021-08-05 ENCOUNTER — Ambulatory Visit: Payer: Medicare Other | Admitting: Family Medicine

## 2021-08-09 ENCOUNTER — Encounter (INDEPENDENT_AMBULATORY_CARE_PROVIDER_SITE_OTHER): Payer: Self-pay | Admitting: Ophthalmology

## 2021-08-09 ENCOUNTER — Ambulatory Visit (INDEPENDENT_AMBULATORY_CARE_PROVIDER_SITE_OTHER): Payer: Medicare (Managed Care) | Admitting: Ophthalmology

## 2021-08-09 DIAGNOSIS — H353124 Nonexudative age-related macular degeneration, left eye, advanced atrophic with subfoveal involvement: Secondary | ICD-10-CM | POA: Diagnosis not present

## 2021-08-09 DIAGNOSIS — H353211 Exudative age-related macular degeneration, right eye, with active choroidal neovascularization: Secondary | ICD-10-CM | POA: Diagnosis not present

## 2021-08-09 DIAGNOSIS — H3561 Retinal hemorrhage, right eye: Secondary | ICD-10-CM

## 2021-08-09 DIAGNOSIS — H353114 Nonexudative age-related macular degeneration, right eye, advanced atrophic with subfoveal involvement: Secondary | ICD-10-CM | POA: Diagnosis not present

## 2021-08-09 DIAGNOSIS — H2512 Age-related nuclear cataract, left eye: Secondary | ICD-10-CM

## 2021-08-09 MED ORDER — BEVACIZUMAB 2.5 MG/0.1ML IZ SOSY
2.5000 mg | PREFILLED_SYRINGE | INTRAVITREAL | Status: AC | PRN
  Administered 2021-08-09: 2.5 mg via INTRAVITREAL

## 2021-08-09 NOTE — Assessment & Plan Note (Signed)
Atrophy accounts for acuity, no evidence of CNVM

## 2021-08-09 NOTE — Assessment & Plan Note (Signed)
Smaller and improving

## 2021-08-09 NOTE — Progress Notes (Signed)
08/09/2021     CHIEF COMPLAINT Patient presents for  Chief Complaint  Patient presents with   Macular Degeneration      HISTORY OF PRESENT ILLNESS: [name] is a 88 y.o. female who presents to the clinic today for:   HPI   Per patient no interval change in acuity Last edited by Hurman Horn, MD on 08/09/2021  1:37 PM.      Referring physician: [name], DO [place],  [place] [id number]  HISTORICAL INFORMATION:   Selected notes from the MEDICAL RECORD NUMBER    Lab Results  Component Value Date   HGBA1C 6.5 (H) 02/04/2021     CURRENT MEDICATIONS: Current Outpatient Medications (Ophthalmic Drugs)  Medication Sig   carboxymethylcellulose (REFRESH PLUS) 0.5 % SOLN Place 1 drop into both eyes 3 (three) times daily as needed (dry/irritated eyes.).   No current facility-administered medications for this visit. (Ophthalmic Drugs)   Current Outpatient Medications (Other)  Medication Sig   acetaminophen (TYLENOL) 500 MG tablet Take 1 tablet (500 mg total) by mouth every 6 (six) hours as needed for mild pain, fever or headache.   Alpha-Lipoic Acid 600 MG TABS Take 600 mg by mouth daily. For neuropathy in legs   amiodarone (PACERONE) 200 MG tablet Take 1 tablet (200 mg total) by mouth daily.   amLODipine (NORVASC) 2.5 MG tablet Take 1 tablet (2.5 mg total) by mouth daily. Do NOT take if BP drops below 130 on top or below 60 on bottom.   aspirin EC 81 MG EC tablet Take 1 tablet (81 mg total) by mouth daily. Swallow whole.   busPIRone (BUSPAR) 15 MG tablet TAKE ONE TABLET TWICE DAILY   Cholecalciferol (VITAMIN D) 50 MCG (2000 UT) CAPS Take 2,000 Units by mouth every morning.   ELIQUIS 2.5 MG TABS tablet TAKE (1) TABLET TWICE A DAY.   feeding supplement (ENSURE ENLIVE / ENSURE PLUS) LIQD Take 237 mLs by mouth 2 (two) times daily between meals.   furosemide (LASIX) 20 MG tablet Take 20 mg by mouth as needed.   gabapentin (NEURONTIN) 100 MG capsule  TAKE UP TO 3 CAPSULES AT BEDTIME AS NEEDED   isosorbide mononitrate (IMDUR) 60 MG 24 hr tablet Take 1 tablet (60 mg total) by mouth daily.   levothyroxine (SYNTHROID) 100 MCG tablet TAKE ONE TABLET BY MOUTH EVERY MORNING   meclizine (ANTIVERT) 12.5 MG tablet Take 1 tablet (12.5 mg total) by mouth 3 (three) times daily as needed for dizziness.   MELATONIN PO Take 3 mg by mouth at bedtime.   metoprolol succinate (TOPROL-XL) 25 MG 24 hr tablet Take 0.5 tablets (12.5 mg total) by mouth daily. Take with or immediately following a meal. (Patient taking differently: Take 12.5 mg by mouth 2 (two) times daily. Take with or immediately following a meal.)   Multiple Vitamins-Minerals (CENTRUM SILVER ULTRA WOMENS PO) Take 1 tablet by mouth every morning.   nitroGLYCERIN (NITROSTAT) 0.4 MG SL tablet PLACE 1 TABLET UNDER THE TONGUE AT ONSET OF CHEST PAIN EVERY 5 MINTUES UP TO 3 TIMES AS NEEDED (Patient not taking: Reported on 07/11/2021)   pantoprazole (PROTONIX) 40 MG tablet TAKE 1 TABLET ONCE DAILY   potassium chloride SA (KLOR-CON) 20 MEQ tablet Take 20 mEq by mouth 3 (three) times a week.   rosuvastatin (CRESTOR) 10 MG tablet TAKE 1 TABLET DAILY   valsartan (DIOVAN) 40 MG tablet Take 0.5 tablets by mouth daily.   No current facility-administered medications  for this visit. (Other)      REVIEW OF SYSTEMS: ROS   Negative for: Constitutional, Gastrointestinal, Neurological, Skin, Genitourinary, Musculoskeletal, HENT, Endocrine, Cardiovascular, Eyes, Respiratory, Psychiatric, Allergic/Imm, Heme/Lymph Last edited by Hurman Horn, MD on 08/09/2021  1:14 PM.       ALLERGIES Allergies  Allergen Reactions   Sotalol Other (See Comments)    Torsades    Tramadol Other (See Comments)    Hallucination, bad-dream   Actos [Pioglitazone] Swelling   Adhesive [Tape] Other (See Comments)    Causes sores   Atorvastatin Other (See Comments)    myalgia   Bactrim [Sulfamethoxazole-Trimethoprim] Itching and Rash     Itchy rash   Ciprofloxacin Nausea Only   Contrast Media [Iodinated Contrast Media] Other (See Comments)    Headache (no action/pre-med required)   Latex Rash   Pedi-Pre Tape Spray [Wound Dressing Adhesive] Other (See Comments)    Causes sores   Sulfa Antibiotics Other (See Comments)    Causes sores    PAST MEDICAL HISTORY Past Medical History:  Diagnosis Date   AICD (automatic cardioverter/defibrillator) present    Allergy    SESONAL   Aneurysm of infrarenal abdominal aorta ([place])    Anxiety    ARTHRITIS    Arthritis    Atrial fibrillation ([place])    CAD (coronary artery disease)    a. history of cardiac arrest 1993;  b. s/p LAD/LCX stenting in 2003;  c. 07/2011 Cath: LM nl, LAD patent stent, D1 80ost, LCX patent stent, RCA min irregs;  d. 04/2012 MV: EF 66%, no ishcemia;  e. 12/2013 Echo: Ef 55%, no rwma, Gr 1 DD, triv AI/MR, mildly dil LA;  f. 12/2013 Lexi MV: intermediate risk - apical ischemia and inf/infsept fixed defect, ? artifactual.   CAD in native artery 12/31/2013   Previous stents to LAD and Ramus patent on cath today 12/31/13 also There is severe disease in the ostial first diagonal which is unchanged from most recent cardiac catheterization. The right coronary artery could not be engaged selectively but nonselective angiography showed no significant disease in the proximal and midsegment.       Cataract    DENIES   Chronic back pain    Chronic sinus bradycardia    CKD (chronic kidney disease), stage III (HCC)    Clotting disorder ([place])    DVT   COLITIS 12/02/2007   Qualifier: Diagnosis of  By: [name], [name]     Diabetes mellitus without complication ([place])    DENIES   Diverticulosis of colon (without mention of hemorrhage) 2007   Colonoscopy    Esophageal stricture    a. 2012 s/p dil.   Esophagitis, unspecified    a. 2012 EGD   EXTERNAL HEMORRHOIDS    GERD (gastroesophageal reflux disease)    omeprazole   H/O hiatal hernia    Hiatal hernia    a.  2012 EGD.   History of DVT in the past, not on Coumadin now    left  leg   HYPERCHOLESTEROLEMIA    Hypertension    ICD (implantable cardiac defibrillator) in place    a. s/p initial ICD in 1993 in setting of cardiac arrest;  b. 01/2007 gen change: Guidant T135 Vitality DS VR single lead ICD.   Macular degeneration    gets injecton in eye every 5 weeks- last injection - 05/03/2013    Myocardial infarction [facility]) 1993   Near syncope 05/22/2015   Nephrolithiasis, just saw Dr [name]  Orthostatic hypotension    Peripheral vascular disease ([place])    ???   RECTAL BLEEDING 12/03/2007   Qualifier: Diagnosis of  By: [name]    Unspecified gastritis and gastroduodenitis without mention of hemorrhage    a. 2003 EGD->not noted on 2012 EGD.   Unstable angina, neg MI, cath stable.maybe GI 12/30/2013   Past Surgical History:  Procedure Laterality Date   BREAST BIOPSY Left 11/2013   CARDIAC CATHETERIZATION     CARDIAC CATHETERIZATION  01/01/15   patent stents -there is severe disease in ostial 1st diag but without change.    CARDIAC DEFIBRILLATOR PLACEMENT  02/05/2007   Guidant   CARDIOVASCULAR STRESS TEST  12/30/2013   abnormal   CARDIOVERSION N/A 02/13/2019   Procedure: CARDIOVERSION;  Surgeon: Sanda Klein, MD;  [facility] [facility];  Service: Cardiovascular;  Laterality: N/A;   CATARACT EXTRACTION Right    Dr. Katy Fitch   COLONOSCOPY     COLONOSCOPY WITH PROPOFOL N/A 03/30/2020   Procedure: COLONOSCOPY WITH PROPOFOL;  Surgeon: Harvel Quale, MD;  [facility];  Service: Gastroenterology;  Laterality: N/A;   coronary stents      CYSTOSCOPY WITH URETEROSCOPY Right 05/20/2013   Procedure: CYSTOSCOPY, RIGHT URETEROSCOPY STONE EXTRACTION, Insertion of right DOUBLE J STENT ;  Surgeon: Irine Seal, MD;  Location: [facility];  Service: Urology;  Laterality: Right;   CYSTOSCOPY WITH URETEROSCOPY AND STENT PLACEMENT N/A 06/03/2013   Procedure: SECOND LOOK  CYSTOSCOPY WITH URETEROSCOPY  HOLMIUM LASER LITHO AND STONE EXTRACTION Sammie Bench ;  Surgeon: Malka So, MD;  Location: [facility];  Service: Urology;  Laterality: N/A;   ESOPHAGOGASTRODUODENOSCOPY (EGD) WITH PROPOFOL N/A 11/20/2019   Procedure: ESOPHAGOGASTRODUODENOSCOPY (EGD) WITH PROPOFOL;  Surgeon: Daneil Dolin, MD;  [facility];  Service: Endoscopy;  Laterality: N/A;   GIVENS CAPSULE STUDY N/A 10/30/2016   Procedure: GIVENS CAPSULE STUDY;  Surgeon: Irene Shipper, MD;  Location[facility] ENDOSCOPY;  Service: Endoscopy;  Laterality: N/A;  NEEDS TO BE ADMITTED FOR OBSERVATION PT. HAS DEFIB   HEMORROIDECTOMY  80's   HOLMIUM LASER APPLICATION Right 09/22/2227   Procedure: HOLMIUM LASER APPLICATION;  Surgeon: Irine Seal, MD;  Location: [facility];  Service: Urology;  Laterality: Right;   HYSTEROSCOPY WITH D & C  09/02/2010   Procedure: DILATATION AND CURETTAGE (D&C) /HYSTEROSCOPY;  Surgeon: Margarette Asal;  Location: [facility];  Service: Gynecology;  Laterality: N/A;  Dilation and Curettage with Hysteroscopy and Polypectomy   IMPLANTABLE CARDIOVERTER DEFIBRILLATOR (ICD) GENERATOR CHANGE N/A 02/10/2014   Procedure: ICD GENERATOR CHANGE;  Surgeon: Sanda Klein, MD;  Location: [facility];  Service: Cardiovascular;  Laterality: N/A;   LEFT HEART CATHETERIZATION WITH CORONARY ANGIOGRAM N/A 07/28/2011   Procedure: LEFT HEART CATHETERIZATION WITH CORONARY ANGIOGRAM;  Surgeon: Lorretta Harp, MD;  Location: [facility];  Service: Cardiovascular;  Laterality: N/A;   LEFT HEART CATHETERIZATION WITH CORONARY ANGIOGRAM N/A 12/31/2013   Procedure: LEFT HEART CATHETERIZATION WITH CORONARY ANGIOGRAM;  Surgeon: Wellington Hampshire, MD;  Location: [facility] C[facility] LAB;  Service: Cardiovascular;  Laterality: N/A;    FAMILY HISTORY Family History  Problem Relation Age of Onset   Stroke Mother    Other Mother        brain tumor   Other Father        MI   Heart attack Father    Stroke Sister    Macular degeneration  Sister    Diabetes Daughter    Cancer Daughter  ovarian   Colon polyps Daughter    Atrial fibrillation Sister    Hyperlipidemia Sister    Osteoporosis Sister    Stroke Sister    Uterine cancer Sister    Heart attack Brother    Heart disease Brother    Asthma Brother    Breast cancer Neg Hx     SOCIAL HISTORY Social History   Tobacco Use   Smoking status: Never   Smokeless tobacco: Never  Vaping Use   Vaping Use: Never used  Substance Use Topics   Alcohol use: No   Drug use: No         OPHTHALMIC EXAM:  Base Eye Exam     Visual Acuity (ETDRS)       Right Left   Dist [place] CF at 3' CF at 3'         Tonometry (Tonopen, 1:15 PM)       Right Left   Pressure 16 15         Neuro/Psych     Oriented x3: Yes   Mood/Affect: Normal         Dilation     Both eyes: 1.0% Mydriacyl, 2.5% Phenylephrine @ 1:16 PM           Slit Lamp and Fundus Exam     External Exam       Right Left   External Normal Normal         Slit Lamp Exam       Right Left   Lids/Lashes Normal Normal   Conjunctiva/Sclera White and quiet White and quiet   Cornea Clear Clear   Anterior Chamber Deep and quiet Deep and quiet   Iris Round and reactive Round and reactive   Lens Posterior chamber intraocular lens Posterior chamber intraocular lens   Anterior Vitreous Normal Normal         Fundus Exam       Right Left   Posterior Vitreous Central vitreous floaters, Posterior vitreous detachment Posterior vitreous detachment   Disc Normal Normal   C/D Ratio 0.15 0.15   Macula Atrophy central atrophy in the FAZ, Mottling, Retinal pigment epithelial mottling with subretinal red telangiectasia new hemorrhage, perifoveal mostly nasal aspect to FAZ Geographic atrophy,  Hard drusen, Advanced age related macular degeneration   Vessels Normal, , no DR Normal,, no DR   Periphery Normal Normal            IMAGING AND PROCEDURES  Imaging and Procedures for  08/09/21  OCT, Retina - OU - Both Eyes       Right Eye Quality was good. Scan locations included subfoveal. Central Foveal Thickness: 202. Progression has been stable. Findings include abnormal foveal contour, subretinal fluid, subretinal scarring, central retinal atrophy, outer retinal atrophy.   Left Eye Quality was borderline. Scan locations included subfoveal. Central Foveal Thickness: 341. Progression has been stable. Findings include no IRF, no SRF, abnormal foveal contour, subretinal hyper-reflective material, central retinal atrophy, inner retinal atrophy, outer retinal atrophy.   Notes OD much improved post recent worsening of wet AMD subfoveal location, onset of therapy June, now much improved anatomically and preserved acuity post Avastin.  Repeat injection today  Bilateral dry ARMD, OS, now with effective therapy upcoming bilateral subfoveal disciform limit vision as well but no active disease at this time.     Intravitreal Injection, Pharmacologic Agent - OD - Right Eye       Time Out 08/09/2021. 1:41 PM. Confirmed correct patient, procedure, site,  and patient consented.   Anesthesia Topical anesthesia was used. Anesthetic medications included Lidocaine 4%.   Procedure Preparation included 5% betadine to ocular surface, 10% betadine to eyelids, Tobramycin 0.3%. A supplied needle was used.   Injection: 2.5 mg bevacizumab 2.5 MG/0.1ML   Route: Intravitreal, Site: Right Eye   NDC: [phone], Waste: 0 mL   Post-op Post injection exam found visual acuity of at least counting fingers. The patient tolerated the procedure well. There were no complications. The patient received written and verbal post procedure care education. Post injection medications were not given.              ASSESSMENT/PLAN:  Exudative age-related macular degeneration of right eye with active choroidal neovascularization (HCC) Much improved anatomy by OCT and clinical findings 1 month post  Avastin No. 1.  Repeat injection today to maintain  Advanced nonexudative age-related macular degeneration of right eye with subfoveal involvement Atrophy accounts for acuity  Advanced nonexudative age-related macular degeneration of left eye with subfoveal involvement Atrophy accounts for acuity, no evidence of CNVM  Retinal hemorrhage of right eye Smaller and improving  Nuclear sclerotic cataract of left eye I have recommended follow-up Dr. Katy Fitch of [facility] eye care and consideration of cataract surgery in the left eye in order to maximize visual potential and peripheral vision in a patient whose infirmities may decrease ambulation and ambulatory ability to have cataract surgery.  Maximization of vision now would be appropriate     ICD-10-CM   1. Exudative age-related macular degeneration of right eye with active choroidal neovascularization (HCC)  H35.3211 OCT, Retina - OU - Both Eyes    Intravitreal Injection, Pharmacologic Agent - OD - Right Eye    bevacizumab (AVASTIN) SOSY 2.5 mg    2. Advanced nonexudative age-related macular degeneration of left eye with subfoveal involvement  H35.3124 OCT, Retina - OU - Both Eyes    3. Advanced nonexudative age-related macular degeneration of right eye with subfoveal involvement  H35.3114     4. Retinal hemorrhage of right eye  H35.61     5. Nuclear sclerotic cataract of left eye  H25.12       1.  OD vastly improved post injection Avastin No. 1.  Repeat injection today to maintain and prevent enlargement of scotoma  2.  Findings and pictures reviewed with patient.  3.  Ophthalmic Meds Ordered this visit:  Meds ordered this encounter  Medications   bevacizumab (AVASTIN) SOSY 2.5 mg       Return in about 6 weeks (around 09/20/2021) for dilate, OD, AVASTIN OCT.  There are no Patient Instructions on file for this visit.   Explained the diagnoses, plan, and follow up with the patient and they expressed understanding.  Patient  expressed understanding of the importance of proper follow up care.   Clent Demark Kue Fox M.D. Diseases & Surgery of the Retina and Vitreous [facility]ina [facility] Diabetic [place] 08/09/21     Abbreviations: M myopia (nearsighted); A astigmatism; H hyperopia (farsighted); P presbyopia; Mrx spectacle prescription;  CTL contact lenses; OD right eye; OS left eye; OU both eyes  XT exotropia; ET esotropia; PEK punctate epithelial keratitis; PEE punctate epithelial erosions; DES dry eye syndrome; MGD meibomian gland dysfunction; ATs artificial tears; PFAT's preservative free artificial tears; [place] nuclear sclerotic cataract; PSC posterior subcapsular cataract; ERM epi-retinal membrane; PVD posterior vitreous detachment; RD retinal detachment; DM diabetes mellitus; DR diabetic retinopathy; NPDR non-proliferative diabetic retinopathy; PDR proliferative diabetic retinopathy; CSME clinically significant macular edema; DME diabetic macular  edema; dbh dot blot hemorrhages; CWS cotton wool spot; POAG primary open angle glaucoma; C/D cup-to-disc ratio; HVF humphrey visual field; GVF goldmann visual field; OCT optical coherence tomography; IOP intraocular pressure; BRVO Branch retinal vein occlusion; CRVO central retinal vein occlusion; CRAO central retinal artery occlusion; BRAO branch retinal artery occlusion; RT retinal tear; SB scleral buckle; PPV pars plana vitrectomy; VH Vitreous hemorrhage; PRP panretinal laser photocoagulation; IVK intravitreal kenalog; VMT vitreomacular traction; MH Macular hole;  NVD neovascularization of the disc; NVE neovascularization elsewhere; AREDS age related eye disease study; ARMD age related macular degeneration; POAG primary open angle glaucoma; EBMD epithelial/anterior basement membrane dystrophy; ACIOL anterior chamber intraocular lens; IOL intraocular lens; PCIOL posterior chamber intraocular lens; Phaco/IOL phacoemulsification with intraocular lens placement; [place] photorefractive keratectomy;  LASIK laser assisted in situ keratomileusis; HTN hypertension; DM diabetes mellitus; COPD chronic obstructive pulmonary disease

## 2021-08-09 NOTE — Assessment & Plan Note (Signed)
Much improved anatomy by OCT and clinical findings 1 month post Avastin No. 1.  Repeat injection today to maintain

## 2021-08-09 NOTE — Assessment & Plan Note (Signed)
I have recommended follow-up Dr. Katy Fitch of [facility] eye care and consideration of cataract surgery in the left eye in order to maximize visual potential and peripheral vision in a patient whose infirmities may decrease ambulation and ambulatory ability to have cataract surgery.  Maximization of vision now would be appropriate

## 2021-08-09 NOTE — Assessment & Plan Note (Signed)
Atrophy accounts for acuity 

## 2021-08-10 NOTE — Progress Notes (Signed)
Remote ICD transmission.   

## 2021-08-30 ENCOUNTER — Ambulatory Visit (HOSPITAL_COMMUNITY)
Admission: RE | Admit: 2021-08-30 | Discharge: 2021-08-30 | Disposition: A | Discharge status: Home / Self Care | Payer: Medicare (Managed Care) | Source: Ambulatory Visit | Attending: Family Medicine | Admitting: Family Medicine

## 2021-08-30 ENCOUNTER — Other Ambulatory Visit: Payer: Self-pay | Admitting: Family Medicine

## 2021-08-30 ENCOUNTER — Other Ambulatory Visit (HOSPITAL_COMMUNITY): Payer: Self-pay | Admitting: Family Medicine

## 2021-08-30 ENCOUNTER — Ambulatory Visit
Admission: RE | Admit: 2021-08-30 | Discharge: 2021-08-30 | Disposition: A | Discharge status: Home / Self Care | Payer: Self-pay | Source: Ambulatory Visit | Attending: Family Medicine | Admitting: Family Medicine

## 2021-08-30 DIAGNOSIS — Z8674 Personal history of sudden cardiac arrest: Secondary | ICD-10-CM

## 2021-08-30 DIAGNOSIS — R42 Dizziness and giddiness: Secondary | ICD-10-CM

## 2021-08-30 DIAGNOSIS — I252 Old myocardial infarction: Secondary | ICD-10-CM

## 2021-08-30 DIAGNOSIS — I25118 Atherosclerotic heart disease of native coronary artery with other forms of angina pectoris: Secondary | ICD-10-CM

## 2021-09-07 IMAGING — CT CT ANGIO CHEST-ABD-PELV FOR DISSECTION W/ AND WO/W CM
2 of 7 series · 12 of 46 positions shown, 14 images · IV contrast (Omnipaque or Isovue)
Comparison: Several prior CT of the abdomen pelvis dating back to
03/28/2020.

CLINICAL DATA: 88-year-old female with abdominal pain. Concern for
aortic dissection.

EXAM:
CT ANGIOGRAPHY CHEST, ABDOMEN AND PELVIS
TECHNIQUE: Non-contrast CT of the chest was initially obtained.

[Series 5: axial arterial · axial · arterial · 0.74mm/px · z∈[-981,-465]mm · 9 of 200 slices shown, 11 images]
[im 14/200  soft-tissue]
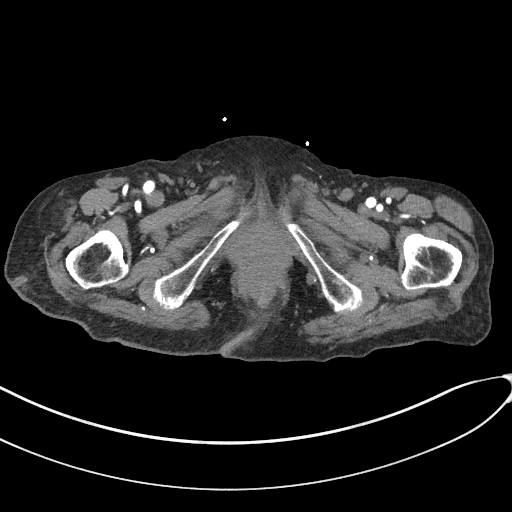
[im 14/200  bone]
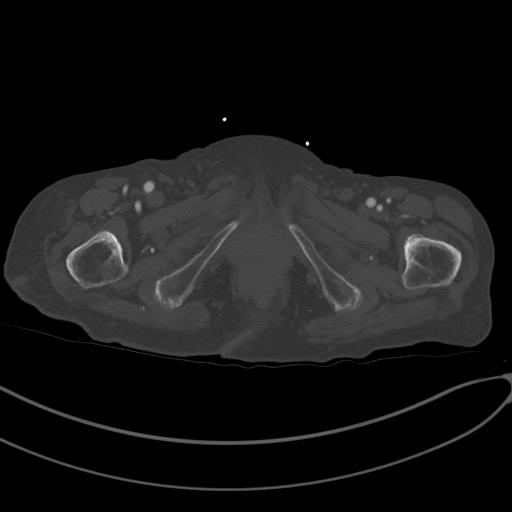
[im 40/200  soft-tissue]
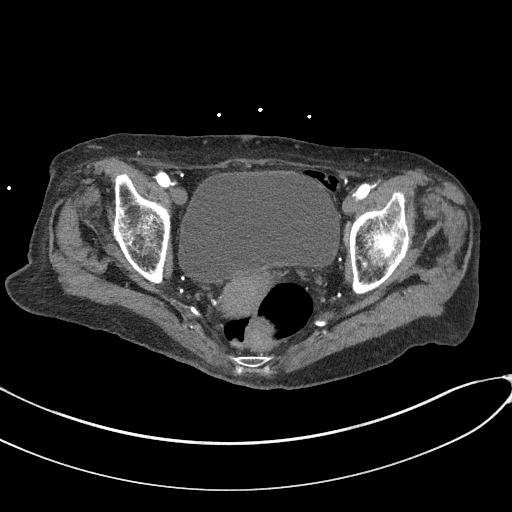
[im 54/200  soft-tissue]
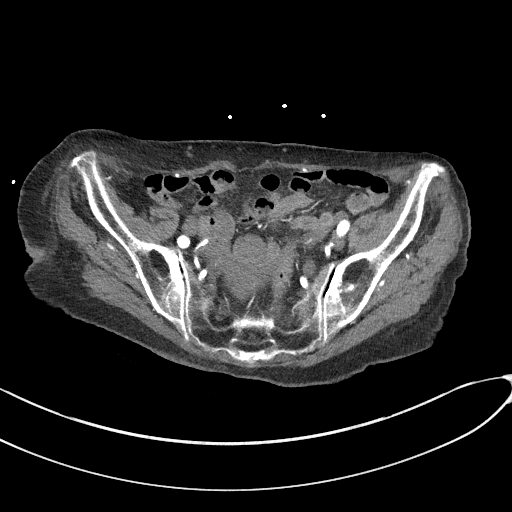
[im 80/200  soft-tissue]
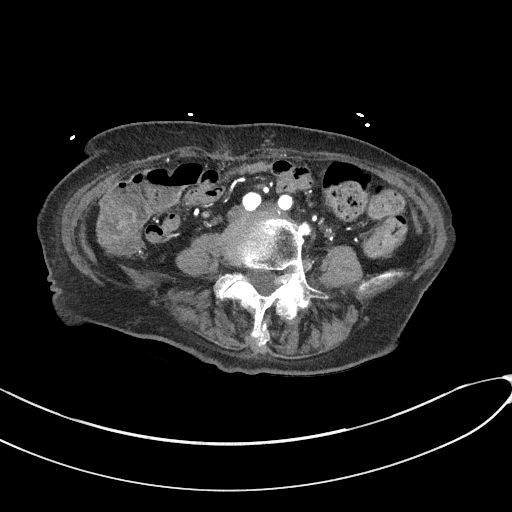
[im 107/200  soft-tissue]
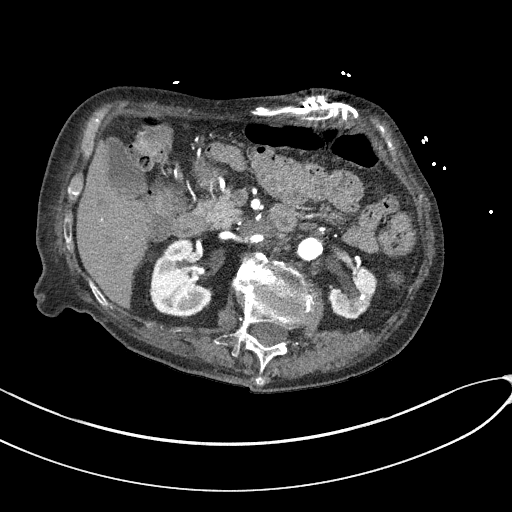
[im 120/200  soft-tissue]
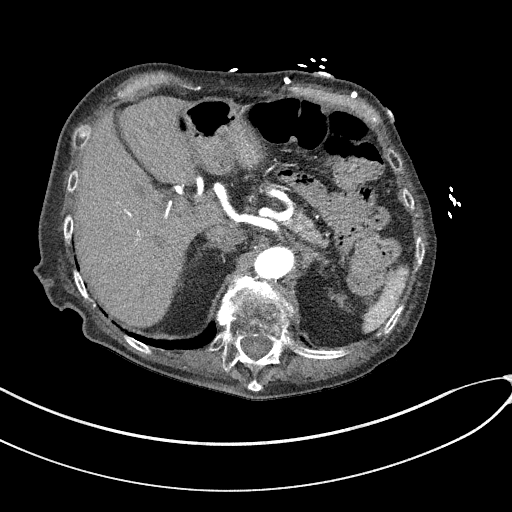
[im 146/200  soft-tissue]
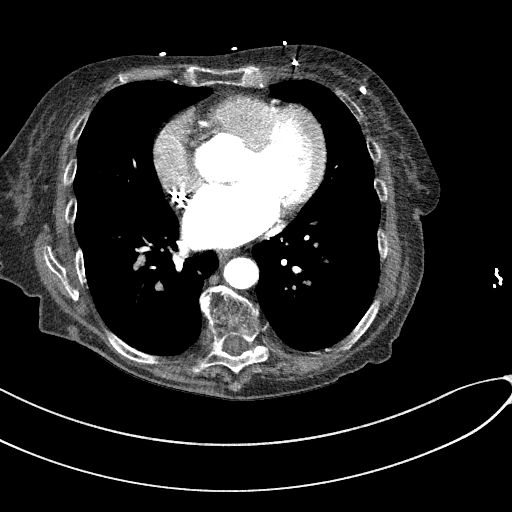
[im 160/200  soft-tissue]
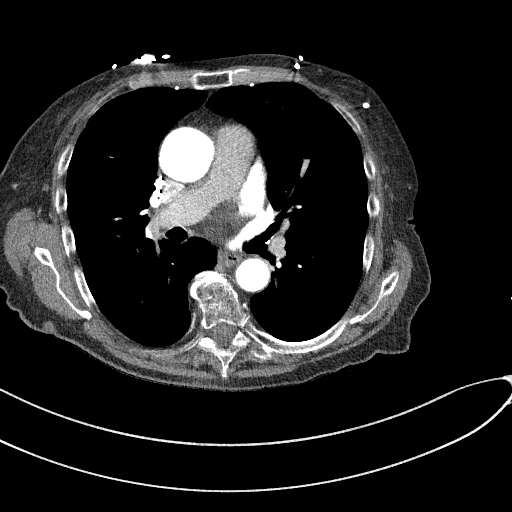
[im 186/200  soft-tissue]
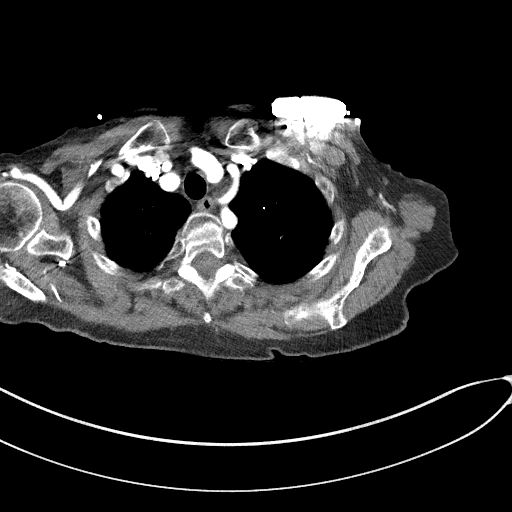
[im 186/200  bone]
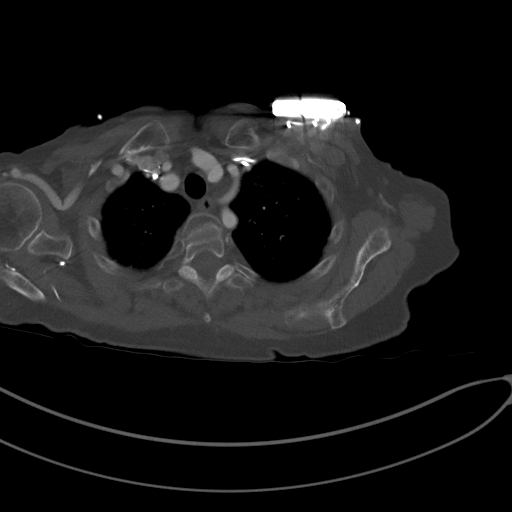

[Series 9: cor soft · coronal · 0.72mm/px · 3 of 150 slices shown]
[im 38/150  soft-tissue]
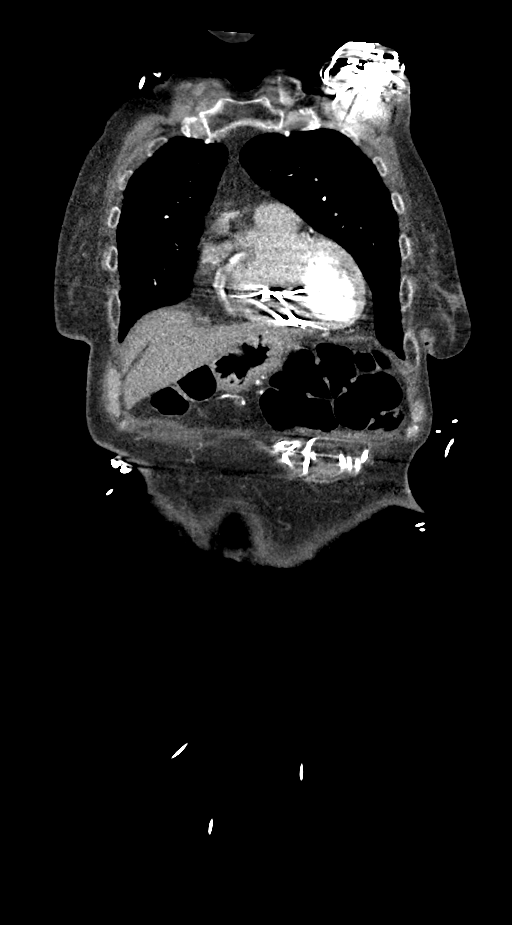
[im 75/150  soft-tissue]
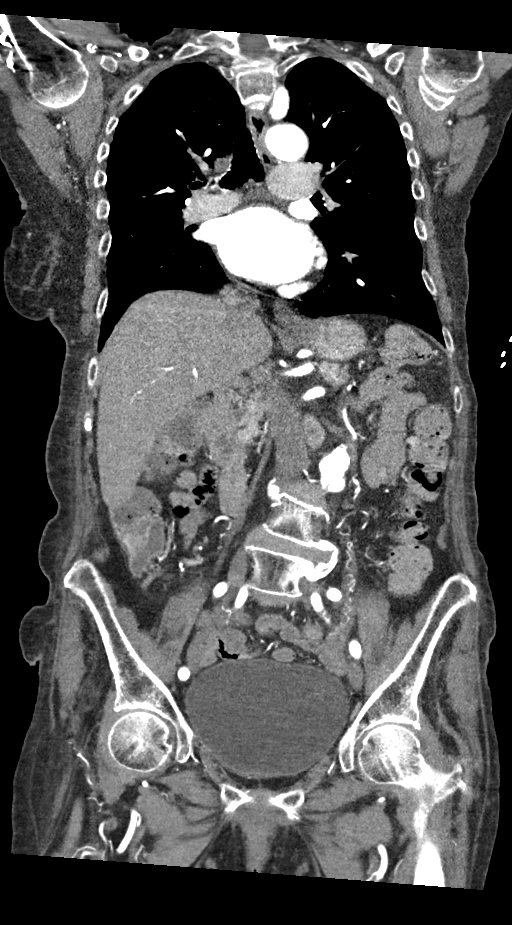
[im 112/150  soft-tissue]
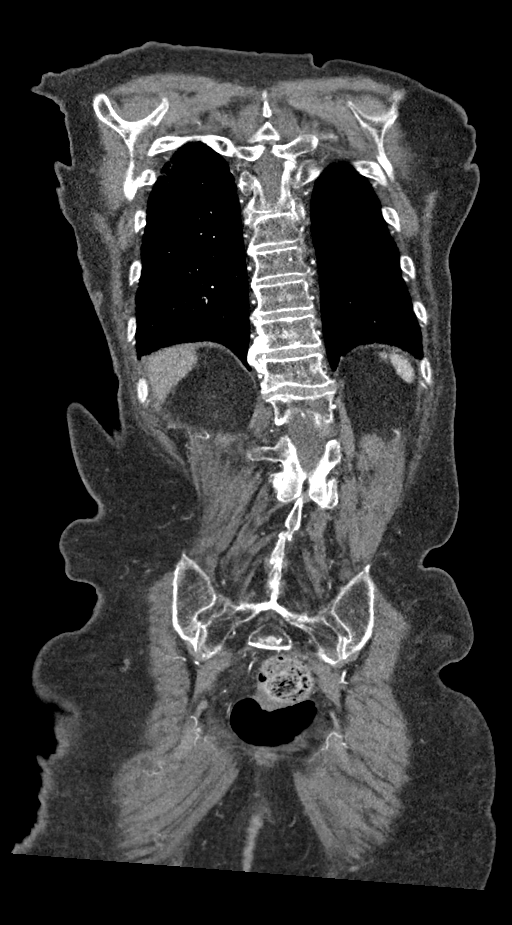

[12 of 46 positions shown; findings below may reference images not displayed]

Multidetector CT imaging through the chest, abdomen and pelvis was
performed using the standard protocol during bolus administration of
intravenous contrast. Multiplanar reconstructed images and MIPs were
obtained and reviewed to evaluate the vascular anatomy.

CONTRAST:  75mL OMNIPAQUE IOHEXOL 350 MG/ML SOLN
FINDINGS: CTA CHEST FINDINGS

Cardiovascular: There is no cardiomegaly or pericardial effusion.
Mild atherosclerotic calcification of the thoracic aorta. No
aneurysmal dilatation or dissection. The origins of the great
vessels of the aortic arch appear patent as visualized. Left
pectoral pacemaker device. The central pulmonary arteries are
grossly unremarkable for the degree of opacification.

Mediastinum/Nodes: No hilar or mediastinal adenopathy. The esophagus
and the thyroid gland are grossly unremarkable. No mediastinal fluid
collection.

Lungs/Pleura: No focal consolidation, pleural effusion, or
pneumothorax. There is a 4 mm right lower lobe subpleural nodule
(107/7). The central airways are patent.

Musculoskeletal: Osteopenia with degenerative changes of the spine.
No acute osseous pathology.

Review of the MIP images confirms the above findings.

CTA ABDOMEN AND PELVIS FINDINGS

VASCULAR

Aorta: There is advanced aortoiliac atherosclerotic disease. Old
appearing focal dissection flap or a penetrating ulcer in the distal
abdominal aorta. The lumen of the aorta measures up to 2.1 cm at
this level. No acute dissection. No aneurysm. No periaortic fluid
collection or hematoma.

Celiac: The celiac artery and its major branches are patent.

SMA: Patent without evidence of aneurysm, dissection, vasculitis or
significant stenosis.

Renals: There is atherosclerotic calcification of the origins of the
renal arteries. The renal arteries remain patent. There is
irregularity of the right renal artery which may represent
fibromuscular dysplasia.

IMA: The IMA is patent.

Inflow: The iliac arteries are patent. No aneurysmal dilatation or
dissection.

Veins: No obvious venous abnormality within the limitations of this
arterial phase study.

Review of the MIP images confirms the above findings.

NON-VASCULAR

No intra-abdominal free air or free fluid.

Hepatobiliary: The liver is unremarkable. No intrahepatic biliary
duct dilatation. No calcified gallstone.

Pancreas: Indeterminate 7 mm hypodense lesion in the neck of the
pancreas (85/5). This may represent a focal area of interspersed
fat, a side branch IPMN or a retention cyst. MRI may provide better
characterization on a nonemergent/outpatient basis, if clinically
indicated. No gland atrophy or dilatation of the main pancreatic
duct. No acute inflammation.

Spleen: Normal in size without focal abnormality.

Adrenals/Urinary Tract: The adrenal glands unremarkable. Moderate
left and mild right renal parenchyma atrophy. Nonobstructing
bilateral renal calculi measure up to 3 mm in the inferior pole of
the right kidney. There is diffuse cortical irregularity and
scarring of the kidneys bilaterally. There is mild fullness of the
left renal collecting system. The visualized ureters and urinary
bladder appear unremarkable.

Stomach/Bowel: There is moderate stool throughout the colon. There
is no bowel obstruction or active inflammation. The appendix is
normal.

Lymphatic: No adenopathy.

Reproductive: The uterus is anteverted. No adnexal masses.

Other: None

Musculoskeletal: Osteopenia with scoliosis and degenerative changes.
No acute osseous pathology.

Review of the MIP images confirms the above findings.
IMPRESSION: 1. No acute intrathoracic, abdominal, or pelvic pathology. No CT
evidence of acute aortic dissection or aneurysm.
2. A 7 mm hypodense lesion in the neck of the pancreas,
indeterminate. MRI may provide better characterization on a
nonemergent/outpatient basis, if clinically indicated.
3. Bilateral renal parenchyma atrophy and cortical irregularity and
scarring. Nonobstructing bilateral renal calculi. No hydronephrosis.
4. No bowel obstruction. Normal appendix.
5. There is a 4 mm right lower lobe subpleural nodule.
6. Aortic Atherosclerosis ([id number]).

## 2021-09-13 ENCOUNTER — Encounter (INDEPENDENT_AMBULATORY_CARE_PROVIDER_SITE_OTHER): Payer: Medicare (Managed Care) | Admitting: Ophthalmology

## 2021-09-22 ENCOUNTER — Ambulatory Visit (INDEPENDENT_AMBULATORY_CARE_PROVIDER_SITE_OTHER): Payer: Medicare (Managed Care) | Admitting: Ophthalmology

## 2021-09-22 ENCOUNTER — Encounter (INDEPENDENT_AMBULATORY_CARE_PROVIDER_SITE_OTHER): Payer: Self-pay | Admitting: Ophthalmology

## 2021-09-22 DIAGNOSIS — H353114 Nonexudative age-related macular degeneration, right eye, advanced atrophic with subfoveal involvement: Secondary | ICD-10-CM

## 2021-09-22 DIAGNOSIS — H353124 Nonexudative age-related macular degeneration, left eye, advanced atrophic with subfoveal involvement: Secondary | ICD-10-CM

## 2021-09-22 DIAGNOSIS — H353222 Exudative age-related macular degeneration, left eye, with inactive choroidal neovascularization: Secondary | ICD-10-CM

## 2021-09-22 DIAGNOSIS — H353211 Exudative age-related macular degeneration, right eye, with active choroidal neovascularization: Secondary | ICD-10-CM | POA: Diagnosis not present

## 2021-09-22 DIAGNOSIS — H2512 Age-related nuclear cataract, left eye: Secondary | ICD-10-CM

## 2021-09-22 MED ORDER — BEVACIZUMAB CHEMO INJECTION 1.25MG/0.05ML SYRINGE FOR KALEIDOSCOPE
1.2500 mg | INTRAVITREAL | Status: AC | PRN
  Administered 2021-09-22: 1.25 mg via INTRAVITREAL

## 2021-09-22 NOTE — Assessment & Plan Note (Signed)
Limits acuity 

## 2021-09-22 NOTE — Progress Notes (Signed)
09/22/2021     CHIEF COMPLAINT Patient presents for  Chief Complaint  Patient presents with   Macular Degeneration      HISTORY OF PRESENT ILLNESS: [name] is a 88 y.o. female who presents to the clinic today for:   HPI   6 weeks for DILATE OD, AVASTIN OCT. Pt stated, "I think my vision is the same. It hasn't gotten any better. I have a cataract in my left eye and Dr. Katy Fitch wanted to proceed with cataract sx. But he wants to ask for Dr. Radene Ou permission before going forward." Pt has allergy to CIPRO. Pt is currently taking ELIQUIS.  Last edited by Silvestre Moment on 09/22/2021  9:59 AM.      Referring physician: No referring provider defined for this encounter.  HISTORICAL INFORMATION:   Selected notes from the MEDICAL RECORD NUMBER    Lab Results  Component Value Date   HGBA1C 6.5 (H) 02/04/2021     CURRENT MEDICATIONS: Current Outpatient Medications (Ophthalmic Drugs)  Medication Sig   carboxymethylcellulose (REFRESH PLUS) 0.5 % SOLN Place 1 drop into both eyes 3 (three) times daily as needed (dry/irritated eyes.).   No current facility-administered medications for this visit. (Ophthalmic Drugs)   Current Outpatient Medications (Other)  Medication Sig   acetaminophen (TYLENOL) 500 MG tablet Take 1 tablet (500 mg total) by mouth every 6 (six) hours as needed for mild pain, fever or headache.   Alpha-Lipoic Acid 600 MG TABS Take 600 mg by mouth daily. For neuropathy in legs   amiodarone (PACERONE) 200 MG tablet Take 1 tablet (200 mg total) by mouth daily.   amLODipine (NORVASC) 2.5 MG tablet Take 1 tablet (2.5 mg total) by mouth daily. Do NOT take if BP drops below 130 on top or below 60 on bottom.   aspirin EC 81 MG EC tablet Take 1 tablet (81 mg total) by mouth daily. Swallow whole.   busPIRone (BUSPAR) 15 MG tablet TAKE ONE TABLET TWICE DAILY   Cholecalciferol (VITAMIN D) 50 MCG (2000 UT) CAPS Take 2,000 Units by mouth every morning.   ELIQUIS 2.5 MG TABS  tablet TAKE (1) TABLET TWICE A DAY.   feeding supplement (ENSURE ENLIVE / ENSURE PLUS) LIQD Take 237 mLs by mouth 2 (two) times daily between meals.   furosemide (LASIX) 20 MG tablet Take 20 mg by mouth as needed.   gabapentin (NEURONTIN) 100 MG capsule TAKE UP TO 3 CAPSULES AT BEDTIME AS NEEDED   isosorbide mononitrate (IMDUR) 60 MG 24 hr tablet Take 1 tablet (60 mg total) by mouth daily.   levothyroxine (SYNTHROID) 100 MCG tablet TAKE ONE TABLET BY MOUTH EVERY MORNING   meclizine (ANTIVERT) 12.5 MG tablet Take 1 tablet (12.5 mg total) by mouth 3 (three) times daily as needed for dizziness.   MELATONIN PO Take 3 mg by mouth at bedtime.   metoprolol succinate (TOPROL-XL) 25 MG 24 hr tablet Take 0.5 tablets (12.5 mg total) by mouth daily. Take with or immediately following a meal. (Patient taking differently: Take 12.5 mg by mouth 2 (two) times daily. Take with or immediately following a meal.)   Multiple Vitamins-Minerals (CENTRUM SILVER ULTRA WOMENS PO) Take 1 tablet by mouth every morning.   nitroGLYCERIN (NITROSTAT) 0.4 MG SL tablet PLACE 1 TABLET UNDER THE TONGUE AT ONSET OF CHEST PAIN EVERY 5 MINTUES UP TO 3 TIMES AS NEEDED (Patient not taking: Reported on 07/11/2021)   pantoprazole (PROTONIX) 40 MG tablet TAKE 1 TABLET ONCE DAILY  potassium chloride SA (KLOR-CON) 20 MEQ tablet Take 20 mEq by mouth 3 (three) times a week.   rosuvastatin (CRESTOR) 10 MG tablet TAKE 1 TABLET DAILY   valsartan (DIOVAN) 40 MG tablet Take 0.5 tablets by mouth daily.   No current facility-administered medications for this visit. (Other)      REVIEW OF SYSTEMS: ROS   Negative for: Constitutional, Gastrointestinal, Neurological, Skin, Genitourinary, Musculoskeletal, HENT, Endocrine, Cardiovascular, Eyes, Respiratory, Psychiatric, Allergic/Imm, Heme/Lymph Last edited by Silvestre Moment on 09/22/2021  9:59 AM.       ALLERGIES Allergies  Allergen Reactions   Sotalol Other (See Comments)    Torsades    Tramadol  Other (See Comments)    Hallucination, bad-dream   Actos [Pioglitazone] Swelling   Adhesive [Tape] Other (See Comments)    Causes sores   Atorvastatin Other (See Comments)    myalgia   Bactrim [Sulfamethoxazole-Trimethoprim] Itching and Rash    Itchy rash   Ciprofloxacin Nausea Only   Contrast Media [Iodinated Contrast Media] Other (See Comments)    Headache (no action/pre-med required)   Latex Rash   Pedi-Pre Tape Spray [Wound Dressing Adhesive] Other (See Comments)    Causes sores   Sulfa Antibiotics Other (See Comments)    Causes sores    PAST MEDICAL HISTORY Past Medical History:  Diagnosis Date   AICD (automatic cardioverter/defibrillator) present    Allergy    SESONAL   Aneurysm of infrarenal abdominal aorta (HCC)    Anxiety    ARTHRITIS    Arthritis    Atrial fibrillation (HCC)    CAD (coronary artery disease)    a. history of cardiac arrest 1993;  b. s/p LAD/LCX stenting in 2003;  c. 07/2011 Cath: LM nl, LAD patent stent, D1 80ost, LCX patent stent, RCA min irregs;  d. 04/2012 MV: EF 66%, no ishcemia;  e. 12/2013 Echo: Ef 55%, no rwma, Gr 1 DD, triv AI/MR, mildly dil LA;  f. 12/2013 Lexi MV: intermediate risk - apical ischemia and inf/infsept fixed defect, ? artifactual.   CAD in native artery 12/31/2013   Previous stents to LAD and Ramus patent on cath today 12/31/13 also There is severe disease in the ostial first diagonal which is unchanged from most recent cardiac catheterization. The right coronary artery could not be engaged selectively but nonselective angiography showed no significant disease in the proximal and midsegment.       Cataract    DENIES   Chronic back pain    Chronic sinus bradycardia    CKD (chronic kidney disease), stage III (HCC)    Clotting disorder ([place])    DVT   COLITIS 12/02/2007   Qualifier: Diagnosis of  By: [name], [name]     Diabetes mellitus without complication ([place])    DENIES   Diverticulosis of colon (without mention of  hemorrhage) 2007   Colonoscopy    Esophageal stricture    a. 2012 s/p dil.   Esophagitis, unspecified    a. 2012 EGD   EXTERNAL HEMORRHOIDS    GERD (gastroesophageal reflux disease)    omeprazole   H/O hiatal hernia    Hiatal hernia    a. 2012 EGD.   History of DVT in the past, not on Coumadin now    left  leg   HYPERCHOLESTEROLEMIA    Hypertension    ICD (implantable cardiac defibrillator) in place    a. s/p initial ICD in 1993 in setting of cardiac arrest;  b. 01/2007 gen change: Guidant T135 Vitality DS  VR single lead ICD.   Macular degeneration    gets injecton in eye every 5 weeks- last injection - 05/03/2013    Myocardial infarction [facility]) 1993   Near syncope 05/22/2015   Nephrolithiasis, just saw Dr [name]    Orthostatic hypotension    Peripheral vascular disease ([place])    ???   RECTAL BLEEDING 12/03/2007   Qualifier: Diagnosis of  By: [name]    Unspecified gastritis and gastroduodenitis without mention of hemorrhage    a. 2003 EGD->not noted on 2012 EGD.   Unstable angina, neg MI, cath stable.maybe GI 12/30/2013   Past Surgical History:  Procedure Laterality Date   BREAST BIOPSY Left 11/2013   CARDIAC CATHETERIZATION     CARDIAC CATHETERIZATION  01/01/15   patent stents -there is severe disease in ostial 1st diag but without change.    CARDIAC DEFIBRILLATOR PLACEMENT  02/05/2007   Guidant   CARDIOVASCULAR STRESS TEST  12/30/2013   abnormal   CARDIOVERSION N/A 02/13/2019   Procedure: CARDIOVERSION;  Surgeon: Sanda Klein, MD;  Location: [place];  Service: Cardiovascular;  Laterality: N/A;   CATARACT EXTRACTION Right    Dr. Katy Fitch   COLONOSCOPY     COLONOSCOPY WITH PROPOFOL N/A 03/30/2020   Procedure: COLONOSCOPY WITH PROPOFOL;  Surgeon: Harvel Quale, MD;  [facility];  Service: Gastroenterology;  Laterality: N/A;   coronary stents      CYSTOSCOPY WITH URETEROSCOPY Right 05/20/2013   Procedure: CYSTOSCOPY, RIGHT  URETEROSCOPY STONE EXTRACTION, Insertion of right DOUBLE J STENT ;  Surgeon: Irine Seal, MD;  Location: [facility];  Service: Urology;  Laterality: Right;   CYSTOSCOPY WITH URETEROSCOPY AND STENT PLACEMENT N/A 06/03/2013   Procedure: SECOND LOOK CYSTOSCOPY WITH URETEROSCOPY  HOLMIUM LASER LITHO AND STONE EXTRACTION Sammie Bench ;  Surgeon: Malka So, MD;  Location: [facility];  Service: Urology;  Laterality: N/A;   ESOPHAGOGASTRODUODENOSCOPY (EGD) WITH PROPOFOL N/A 11/20/2019   Procedure: ESOPHAGOGASTRODUODENOSCOPY (EGD) WITH PROPOFOL;  Surgeon: Daneil Dolin, MD;  [facility];  Service: Endoscopy;  Laterality: N/A;   GIVENS CAPSULE STUDY N/A 10/30/2016   Procedure: GIVENS CAPSULE STUDY;  Surgeon: Irene Shipper, MD;  Location: [facility] ENDOSCOPY;  Service: Endoscopy;  Laterality: N/A;  NEEDS TO BE ADMITTED FOR OBSERVATION PT. HAS DEFIB   HEMORROIDECTOMY  80's   HOLMIUM LASER APPLICATION Right 06/21/4401   Procedure: HOLMIUM LASER APPLICATION;  Surgeon: Irine Seal, MD;  Location: [facility];  Service: Urology;  Laterality: Right;   HYSTEROSCOPY WITH D & C  09/02/2010   Procedure: DILATATION AND CURETTAGE (D&C) /HYSTEROSCOPY;  Surgeon: Margarette Asal;  Location: [facility];  Service: Gynecology;  Laterality: N/A;  Dilation and Curettage with Hysteroscopy and Polypectomy   IMPLANTABLE CARDIOVERTER DEFIBRILLATOR (ICD) GENERATOR CHANGE N/A 02/10/2014   Procedure: ICD GENERATOR CHANGE;  Surgeon: Sanda Klein, MD;  Location: [place] CATH LAB;  Service: Cardiovascular;  Laterality: N/A;   LEFT HEART CATHETERIZATION WITH CORONARY ANGIOGRAM N/A 07/28/2011   Procedure: LEFT HEART CATHETERIZATION WITH CORONARY ANGIOGRAM;  Surgeon: Lorretta Harp, MD;  Location: [facility] C[facility] LAB;  Service: Cardiovascular;  Laterality: N/A;   LEFT HEART CATHETERIZATION WITH CORONARY ANGIOGRAM N/A 12/31/2013   Procedure: LEFT HEART CATHETERIZATION WITH CORONARY ANGIOGRAM;  Surgeon: Wellington Hampshire, MD;  Location: [facility] CATH LAB;  Service:  Cardiovascular;  Laterality: N/A;    FAMILY HISTORY Family History  Problem Relation Age of Onset   Stroke Mother    Other Mother  brain tumor   Other Father        MI   Heart attack Father    Stroke Sister    Macular degeneration Sister    Diabetes Daughter    Cancer Daughter        ovarian   Colon polyps Daughter    Atrial fibrillation Sister    Hyperlipidemia Sister    Osteoporosis Sister    Stroke Sister    Uterine cancer Sister    Heart attack Brother    Heart disease Brother    Asthma Brother    Breast cancer Neg Hx     SOCIAL HISTORY Social History   Tobacco Use   Smoking status: Never   Smokeless tobacco: Never  Vaping Use   Vaping Use: Never used  Substance Use Topics   Alcohol use: No   Drug use: No         OPHTHALMIC EXAM:  Base Eye Exam     Visual Acuity (ETDRS)       Right Left   Dist [place] CF at 3' CF at 3'         Tonometry (Tonopen, 10:02 AM)       Right Left   Pressure 12 14         Pupils       Pupils APD   Right PERRL None   Left PERRL None         Visual Fields       Left Right   Restrictions Partial outer superior temporal, inferior temporal, superior nasal, inferior nasal deficiencies Partial outer superior temporal, inferior temporal, superior nasal, inferior nasal deficiencies         Extraocular Movement       Right Left    Full, Ortho Full, Ortho         Neuro/Psych     Oriented x3: Yes   Mood/Affect: Normal         Dilation     Right eye: 2.5% Phenylephrine, 1.0% Mydriacyl @ 10:02 AM           Slit Lamp and Fundus Exam     External Exam       Right Left   External Normal Normal         Slit Lamp Exam       Right Left   Lids/Lashes Normal Normal   Conjunctiva/Sclera White and quiet White and quiet   Cornea Clear Clear   Anterior Chamber Deep and quiet Deep and quiet   Iris Round and reactive Round and reactive   Lens Posterior chamber intraocular lens Posterior  chamber intraocular lens   Anterior Vitreous Normal Normal         Fundus Exam       Right Left   Posterior Vitreous Central vitreous floaters, Posterior vitreous detachment    Disc Normal    C/D Ratio 0.15    Macula Atrophy central atrophy in the FAZ, Mottling, Retinal pigment epithelial mottling with subretinal red telangiectasia, resolved hemorrhage, perifoveal mostly nasal aspect to FAZ    Vessels Normal, , no DR    Periphery Normal             IMAGING AND PROCEDURES  Imaging and Procedures for 09/22/21  OCT, Retina - OU - Both Eyes       Right Eye Quality was good. Scan locations included subfoveal. Central Foveal Thickness: 202. Progression has been stable. Findings include abnormal foveal contour, subretinal fluid, subretinal scarring, central  retinal atrophy, outer retinal atrophy.   Left Eye Quality was borderline. Scan locations included subfoveal. Central Foveal Thickness: 341. Progression has been stable. Findings include no IRF, no SRF, abnormal foveal contour, subretinal hyper-reflective material, central retinal atrophy, inner retinal atrophy, outer retinal atrophy.   Notes OD much improved post recent worsening of wet AMD subfoveal location, onset of therapy June, now much improved anatomically and preserved acuity post Avastin.  Repeat injection today  Bilateral dry ARMD, OS, now with effective therapy upcoming bilateral subfoveal disciform limit vision as well but no active disease at this time.     Intravitreal Injection, Pharmacologic Agent - OD - Right Eye       Time Out 09/22/2021. 10:52 AM. Confirmed correct patient, procedure, site, and patient consented.   Anesthesia Topical anesthesia was used. Anesthetic medications included Lidocaine 4%.   Procedure Preparation included 5% betadine to ocular surface, 10% betadine to eyelids, Tobramycin 0.3%. A supplied needle was used.   Injection: 1.25 mg Bevacizumab 1.25mg /0.65ml   Route:  Intravitreal, Site: Right Eye   NDC: [id number], Lot: [id number], Expiration date: 11/30/2021   Post-op Post injection exam found visual acuity of at least counting fingers. The patient tolerated the procedure well. There were no complications. The patient received written and verbal post procedure care education. Post injection medications were not given.              ASSESSMENT/PLAN:  Advanced nonexudative age-related macular degeneration of right eye with subfoveal involvement Limits acuity  Advanced nonexudative age-related macular degeneration of left eye with subfoveal involvement Limits acuity  Exudative age-related macular degeneration of left eye with inactive choroidal neovascularization (HCC) No recurrence  Exudative age-related macular degeneration of right eye with active choroidal neovascularization (HCC) Onset recurrence with hemorrhage, goal is been to treat to prevent enlargement of scotoma.  Successfully much less hemorrhage now.  Nuclear sclerotic cataract of left eye I do recommend consideration of cataract surgery in left eye while patient is capable and ambulatory in order to maximize visual potential and peripheral vision during the latter years of life.    Follow-up with [facility] Dr. Clent Jacks or Dr. Gerald Stabs      ICD-10-CM   1. Exudative age-related macular degeneration of right eye with active choroidal neovascularization (HCC)  H35.3211 OCT, Retina - OU - Both Eyes    Intravitreal Injection, Pharmacologic Agent - OD - Right Eye    Bevacizumab (AVASTIN) SOLN 1.25 mg    2. Advanced nonexudative age-related macular degeneration of right eye with subfoveal involvement  H35.3114     3. Advanced nonexudative age-related macular degeneration of left eye with subfoveal involvement  H35.3124     4. Exudative age-related macular degeneration of left eye with inactive choroidal neovascularization ([place])  H35.3222     5. Nuclear sclerotic cataract of left  eye  H25.12       1.  OD successful control of scotoma growth by treatment of wet AMD adjacent to FAZ with intravitreal Avastin today at 6-week interval.  Repeat injection today and examination next in 9 weeks  2.  OS with ongoing cataract.  Certainly visual acuity centrally will not improve with surgery but nonetheless performance of cataract surgery will maximize peripheral vision but also allow the patient while still ambulatory to have maximal visual acuity for the remainder of life  3.  Ophthalmic Meds Ordered this visit:  Meds ordered this encounter  Medications   Bevacizumab (AVASTIN) SOLN 1.25 mg  Return in about 9 weeks (around 11/24/2021) for DILATE OU, AVASTIN OCT, OD.  There are no Patient Instructions on file for this visit.   Explained the diagnoses, plan, and follow up with the patient and they expressed understanding.  Patient expressed understanding of the importance of proper follow up care.   Clent Demark Mamoru Takeshita M.D. Diseases & Surgery of the Retina and Vitreous [facility]ina [facility] Diabetic [place] 09/22/21     Abbreviations: M myopia (nearsighted); A astigmatism; H hyperopia (farsighted); P presbyopia; Mrx spectacle prescription;  CTL contact lenses; OD right eye; OS left eye; OU both eyes  XT exotropia; ET esotropia; PEK punctate epithelial keratitis; PEE punctate epithelial erosions; DES dry eye syndrome; MGD meibomian gland dysfunction; ATs artificial tears; PFAT's preservative free artificial tears; [place] nuclear sclerotic cataract; PSC posterior subcapsular cataract; ERM epi-retinal membrane; PVD posterior vitreous detachment; RD retinal detachment; DM diabetes mellitus; DR diabetic retinopathy; NPDR non-proliferative diabetic retinopathy; PDR proliferative diabetic retinopathy; CSME clinically significant macular edema; DME diabetic macular edema; dbh dot blot hemorrhages; CWS cotton wool spot; POAG primary open angle glaucoma; C/D cup-to-disc ratio; HVF humphrey  visual field; GVF goldmann visual field; OCT optical coherence tomography; IOP intraocular pressure; BRVO Branch retinal vein occlusion; CRVO central retinal vein occlusion; CRAO central retinal artery occlusion; BRAO branch retinal artery occlusion; RT retinal tear; SB scleral buckle; PPV pars plana vitrectomy; VH Vitreous hemorrhage; PRP panretinal laser photocoagulation; IVK intravitreal kenalog; VMT vitreomacular traction; MH Macular hole;  NVD neovascularization of the disc; NVE neovascularization elsewhere; AREDS age related eye disease study; ARMD age related macular degeneration; POAG primary open angle glaucoma; EBMD epithelial/anterior basement membrane dystrophy; ACIOL anterior chamber intraocular lens; IOL intraocular lens; PCIOL posterior chamber intraocular lens; Phaco/IOL phacoemulsification with intraocular lens placement; [place] photorefractive keratectomy; LASIK laser assisted in situ keratomileusis; HTN hypertension; DM diabetes mellitus; COPD chronic obstructive pulmonary disease

## 2021-09-22 NOTE — Assessment & Plan Note (Signed)
Onset recurrence with hemorrhage, goal is been to treat to prevent enlargement of scotoma.  Successfully much less hemorrhage now.

## 2021-09-22 NOTE — Assessment & Plan Note (Signed)
No recurrence. 

## 2021-09-22 NOTE — Assessment & Plan Note (Signed)
I do recommend consideration of cataract surgery in left eye while patient is capable and ambulatory in order to maximize visual potential and peripheral vision during the latter years of life.    Follow-up with [facility] Dr. Clent Jacks or Dr. Gerald Stabs

## 2021-10-12 ENCOUNTER — Other Ambulatory Visit: Payer: Self-pay | Admitting: Nephrology

## 2021-10-12 ENCOUNTER — Other Ambulatory Visit (HOSPITAL_COMMUNITY): Payer: Self-pay | Admitting: Nephrology

## 2021-10-12 DIAGNOSIS — N1832 Chronic kidney disease, stage 3b: Secondary | ICD-10-CM

## 2021-10-19 ENCOUNTER — Ambulatory Visit (INDEPENDENT_AMBULATORY_CARE_PROVIDER_SITE_OTHER): Payer: Medicare Other

## 2021-10-19 ENCOUNTER — Other Ambulatory Visit: Payer: Self-pay

## 2021-10-19 ENCOUNTER — Other Ambulatory Visit (HOSPITAL_COMMUNITY): Payer: Self-pay | Admitting: *Deleted

## 2021-10-19 DIAGNOSIS — I472 Ventricular tachycardia, unspecified: Secondary | ICD-10-CM | POA: Diagnosis not present

## 2021-10-19 DIAGNOSIS — N1832 Chronic kidney disease, stage 3b: Secondary | ICD-10-CM

## 2021-10-19 LAB — CUP PACEART REMOTE DEVICE CHECK
Battery Remaining Longevity: 120 mo
Battery Remaining Percentage: 100 %
Brady Statistic RV Percent Paced: 0 %
Date Time Interrogation Session: 20231004085700
HighPow Impedance: 38 Ohm
Implantable Lead Implant Date: 20090120
Implantable Lead Location: 753860
Implantable Lead Model: 157
Implantable Lead Serial Number: 138237
Implantable Pulse Generator Implant Date: 20160126
Lead Channel Impedance Value: 489 Ohm
Lead Channel Pacing Threshold Amplitude: 0.9 V
Lead Channel Pacing Threshold Pulse Width: 0.5 ms
Lead Channel Setting Pacing Amplitude: 2.2 V
Lead Channel Setting Pacing Pulse Width: 0.5 ms
Lead Channel Setting Sensing Sensitivity: 0.6 mV
Pulse Gen Serial Number: 191956

## 2021-10-20 ENCOUNTER — Ambulatory Visit (HOSPITAL_COMMUNITY): Payer: Medicare Other

## 2021-10-20 ENCOUNTER — Inpatient Hospital Stay (HOSPITAL_COMMUNITY): Admission: RE | Admit: 2021-10-20 | Payer: Medicare Other | Source: Ambulatory Visit

## 2021-10-21 ENCOUNTER — Other Ambulatory Visit (HOSPITAL_COMMUNITY): Payer: Self-pay

## 2021-10-21 DIAGNOSIS — N1832 Chronic kidney disease, stage 3b: Secondary | ICD-10-CM

## 2021-10-27 ENCOUNTER — Other Ambulatory Visit (HOSPITAL_COMMUNITY): Payer: Self-pay | Admitting: *Deleted

## 2021-10-27 ENCOUNTER — Encounter (HOSPITAL_COMMUNITY): Payer: Medicare Other

## 2021-10-28 ENCOUNTER — Encounter (HOSPITAL_COMMUNITY)
Admission: RE | Admit: 2021-10-28 | Discharge: 2021-10-28 | Disposition: A | Discharge status: Home / Self Care | Payer: Medicare (Managed Care) | Source: Ambulatory Visit | Attending: Internal Medicine | Admitting: Internal Medicine

## 2021-10-28 ENCOUNTER — Ambulatory Visit (HOSPITAL_COMMUNITY)
Admission: RE | Admit: 2021-10-28 | Discharge: 2021-10-28 | Disposition: A | Discharge status: Home / Self Care | Payer: Medicare (Managed Care) | Source: Ambulatory Visit

## 2021-10-28 DIAGNOSIS — D631 Anemia in chronic kidney disease: Secondary | ICD-10-CM | POA: Diagnosis present

## 2021-10-28 DIAGNOSIS — I701 Atherosclerosis of renal artery: Secondary | ICD-10-CM | POA: Insufficient documentation

## 2021-10-28 DIAGNOSIS — I773 Arterial fibromuscular dysplasia: Secondary | ICD-10-CM | POA: Diagnosis not present

## 2021-10-28 DIAGNOSIS — N1832 Chronic kidney disease, stage 3b: Secondary | ICD-10-CM | POA: Insufficient documentation

## 2021-10-28 MED ORDER — SODIUM CHLORIDE 0.9 % IV SOLN
510.0000 mg | INTRAVENOUS | Status: DC
  Administered 2021-10-28: 510 mg via INTRAVENOUS
  Filled 2021-10-28: qty 17

## 2021-10-28 NOTE — Progress Notes (Signed)
Remote ICD transmission.   

## 2021-11-04 ENCOUNTER — Encounter (HOSPITAL_COMMUNITY)
Admission: RE | Admit: 2021-11-04 | Discharge: 2021-11-04 | Disposition: A | Discharge status: Home / Self Care | Payer: Medicare (Managed Care) | Source: Ambulatory Visit | Attending: Internal Medicine | Admitting: Internal Medicine

## 2021-11-04 DIAGNOSIS — N1832 Chronic kidney disease, stage 3b: Secondary | ICD-10-CM | POA: Diagnosis not present

## 2021-11-04 MED ORDER — SODIUM CHLORIDE 0.9 % IV SOLN
510.0000 mg | INTRAVENOUS | Status: DC
  Administered 2021-11-04: 510 mg via INTRAVENOUS
  Filled 2021-11-04: qty 17

## 2021-11-07 ENCOUNTER — Ambulatory Visit: Payer: Medicare (Managed Care) | Attending: Cardiovascular Disease | Admitting: Cardiovascular Disease

## 2021-11-07 ENCOUNTER — Encounter: Payer: Self-pay | Admitting: Cardiovascular Disease

## 2021-11-07 VITALS — BP 140/62 | HR 50 | Ht 63.0 in | Wt 130.0 lb

## 2021-11-07 DIAGNOSIS — I25118 Atherosclerotic heart disease of native coronary artery with other forms of angina pectoris: Secondary | ICD-10-CM | POA: Diagnosis not present

## 2021-11-07 DIAGNOSIS — I10 Essential (primary) hypertension: Secondary | ICD-10-CM | POA: Insufficient documentation

## 2021-11-07 DIAGNOSIS — I639 Cerebral infarction, unspecified: Secondary | ICD-10-CM

## 2021-11-07 DIAGNOSIS — Z79899 Other long term (current) drug therapy: Secondary | ICD-10-CM | POA: Insufficient documentation

## 2021-11-07 DIAGNOSIS — I480 Paroxysmal atrial fibrillation: Secondary | ICD-10-CM | POA: Diagnosis not present

## 2021-11-07 DIAGNOSIS — R001 Bradycardia, unspecified: Secondary | ICD-10-CM | POA: Diagnosis not present

## 2021-11-07 DIAGNOSIS — E7800 Pure hypercholesterolemia, unspecified: Secondary | ICD-10-CM | POA: Insufficient documentation

## 2021-11-07 DIAGNOSIS — I472 Ventricular tachycardia, unspecified: Secondary | ICD-10-CM | POA: Insufficient documentation

## 2021-11-07 DIAGNOSIS — R4189 Other symptoms and signs involving cognitive functions and awareness: Secondary | ICD-10-CM | POA: Insufficient documentation

## 2021-11-07 DIAGNOSIS — E119 Type 2 diabetes mellitus without complications: Secondary | ICD-10-CM | POA: Insufficient documentation

## 2021-11-07 DIAGNOSIS — Z95810 Presence of automatic (implantable) cardiac defibrillator: Secondary | ICD-10-CM | POA: Insufficient documentation

## 2021-11-07 DIAGNOSIS — N1832 Chronic kidney disease, stage 3b: Secondary | ICD-10-CM | POA: Insufficient documentation

## 2021-11-07 DIAGNOSIS — Z5181 Encounter for therapeutic drug level monitoring: Secondary | ICD-10-CM | POA: Insufficient documentation

## 2021-11-07 DIAGNOSIS — I700 Atherosclerosis of aorta: Secondary | ICD-10-CM | POA: Insufficient documentation

## 2021-11-07 DIAGNOSIS — I951 Orthostatic hypotension: Secondary | ICD-10-CM | POA: Insufficient documentation

## 2021-11-07 DIAGNOSIS — D6869 Other thrombophilia: Secondary | ICD-10-CM | POA: Insufficient documentation

## 2021-11-07 MED ORDER — VALSARTAN 40 MG PO TABS: 20.0000 mg | ORAL_TABLET | Freq: Every day | ORAL | 11 refills | Status: DC | PRN

## 2021-11-07 NOTE — Patient Instructions (Signed)
Medication Instructions:  TAKE Valsartan 20 mg (half a tablet) as needed for a systolic over 830  *If you need a refill on your cardiac medications before your next appointment, please call your pharmacy*   Lab Work: Your provider would like for you to have the following labs today: CBC, CMET and TSH  If you have labs (blood work) drawn today and your tests are completely normal, you will receive your results only by: [place] (if you have MyChart) OR A paper copy in the mail If you have any lab test that is abnormal or we need to change your treatment, we will call you to review the results.   Testing/Procedures: None ordered   Follow-Up: At [facility], you and your health needs are our priority.  As part of our continuing mission to provide you with exceptional heart care, we have created designated Provider Care Teams.  These Care Teams include your primary Cardiologist (physician) and Advanced Practice Providers (APPs -  Physician Assistants and Nurse Practitioners) who all work together to provide you with the care you need, when you need it.  We recommend signing up for the patient portal called "MyChart".  Sign up information is provided on this After Visit Summary.  MyChart is used to connect with patients for Virtual Visits (Telemedicine).  Patients are able to view lab/test results, encounter notes, upcoming appointments, etc.  Non-urgent messages can be sent to your provider as well.   To learn more about what you can do with MyChart, go to [email or web address].    Your next appointment:   6 month(s)  The format for your next appointment:   In Person  Provider:   Sanda Klein, MD

## 2021-11-07 NOTE — Progress Notes (Signed)
. Patient ID: [name], female   DOB: 02-Aug-1931, 88 y.o.   MRN: [id number]    Cardiology Office Note    Date:  11/08/2021   ID:  CARRY WEESNER, DOB 06/24/1931, MRN [id number]  PCP:  Janifer Adie, MD  Cardiologist:   Sanda Klein, MD   Chief Complaint  Patient presents with   Palpitations     History of Present Illness:  [name] is a 88 y.o. 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  She has not required hospitalization or emergency room evaluation since the chest pain event on 04/18/2021.  Her most common complaint is dizziness, but this is often brought on just by turning her head or rolling over in bed. She has occasional palpitations (pounding sensation in chest and head, not irregular or fast). She has not had syncope or serious falls. Denies orthopnea/PND/edema. She is immediately aware of V pacing in the clinic today during lead threshold testing, but this a different sensation. She has occasional chest tightness, not clearly exertional. Denies bleeding problems.  We have been trying to avoid ventricular pacing by keeping her lower rate limit low at 45 bpm and decreasing her beta-blocker dose.  She does not have an atrial lead and has declined invasive procedures such as upgrade of her device to a dual-chamber device.  She also reiterates the fact that she does not want to have invasive procedures such as cardiac catheterization.    She has been taking  furosemide 20 mg daily.  She also has a prescription for low-dose of valsartan to take as needed for elevated blood pressure, prescribed by Dr. Theador Hawthorne.  Interrogation of her defibrillator shows normal findings.  Typically has heart rates in the 50-60s and has zero V pacing.   In the past heart rate histograms tended to show a second heart rate peak in the 90s, presumably atrial fibrillation.  None seen in the last year or so.  Device function is otherwise normal.  Lead parameters are normal.  She has not had any episodes of ventricular tachycardia.   Remains lean, but has gained back a little weight, now BMI 23. Her ICD sticks out very prominently.  She has mild cognitive deficits, especially short-term memory loss, but continues to live independently with support from family and nursing aide.  Most recent TSH and LFTs were normal. Recent LDL 69. Creatinine 1.2-1.4.  She presented with a couple of episodes of abdominal pain evaluated in the emergency room in February and was finally hospitalized in March 2022.  She underwent a colonoscopy that showed diverticulosis and a carpet polyp (not removed due to anticoagulation), but no other major findings.  CT of the abdomen described a focal dissection of the infrarenal aorta (old).  There was no evidence of acute mesenteric ischemia, but the patient was diagnosed as probably having intestinal angina with microvascular etiology (versus bowel hypersensitivity), and improvement was reportedly noted with isosorbide mononitrate 30 mg daily..  Her abdominal pain is a "knotting sensation",  clearly worsened by food and she has developed some degree of food aversion.  She has to  eat very small meals.  Her nephrologist has been encouraging her to make sure that she drinks enough fluid.    She was hospitalized in June 2022 with cellulitis related to an ulceration of the left great toe for about 4 days.  She was seen again in the emergency room with left leg pain  (without evidence of recurrent infection) had a CT angiogram of the chest abdomen and pelvis, describing the presence of aortic atherosclerosis and "old appearing focal dissection flap or a penetrating ulcer in the distal abdominal aorta, lumen measures up to 2.1 cm at this level, no acute dissection, no aneurysm".  The focal dissection was also present on the study in March 2022.  The celiac artery, superior mesenteric artery and inferior mesenteric artery are specifically described as patent.  The right renal artery described as irregular, possible fibromuscular dysplasia without mention of actual stenosis.  There was no evidence of hepatic biliary duct dilation or gallstones.  Indeterminant 7 mm hypodense lesion in the neck of the pancreas was described, although this was not seen on the study from March 2022 and February 2020.  She has extensive chronic nephrolithiasis/nephrocalcinosis and T12 and L1 compression fractures which are old  In recent years she has had increasing problems with symptomatic atrial fibrillation.  Although she has a single chamber defibrillator, making it impossible to precisely diagnose the episodes of atrial fibrillation, there is clear evidence of periods of increased ventricular rate from her baseline, likely representing symptomatic atrial fibrillation.  She develops palpitation, chest discomfort, shortness of breath and if the spells are lengthy she eventually develops overt lower extremity edema.  She underwent a cardioversion in January 2021 from atrial fibrillation to normal rhythm, associated with improvement in symptoms.  She has been taking amiodarone since February.  She was hospitalized with dizziness, orthostatic hypotension and severe hypothyroidism (TSH 56) in July 2021  It is likely she had iatrogenic hypothyroidism due to amiodarone.  Levothyroxine was started, beta-blocker therapy was discontinued.  She was on treatment with disopyramide for years for her history  of catecholaminergic polymorphic VT, but this medication had to be gradually discontinued due to side effects, especially orthostatic hypotension and dry mouth.  In the past she developed torsades de pointes with sotalol and diarrhea with quinidine.  She has not had defibrillator discharges since the 1990s.   Past Medical History:  Diagnosis Date   AICD (automatic cardioverter/defibrillator) present    Allergy    SESONAL   Aneurysm of infrarenal abdominal aorta (HCC)    Anxiety    ARTHRITIS    Arthritis    Atrial fibrillation (HCC)    CAD (coronary artery disease)    a. history of cardiac arrest 1993;  b. s/p LAD/LCX stenting in 2003;  c. 07/2011 Cath: LM nl, LAD patent stent, D1 80ost, LCX patent stent, RCA min irregs;  d. 04/2012 MV: EF 66%, no ishcemia;  e. 12/2013 Echo: Ef 55%, no rwma, Gr 1 DD, triv AI/MR, mildly dil LA;  f. 12/2013 Lexi MV: intermediate risk - apical ischemia and inf/infsept fixed defect, ? artifactual.   CAD in native artery 12/31/2013   Previous stents to LAD and Ramus patent on cath today 12/31/13 also There is severe disease in the ostial first diagonal which is unchanged from most recent cardiac catheterization. The right coronary artery could not be engaged selectively but nonselective angiography showed no significant disease in the proximal and midsegment.       Cataract    DENIES  Chronic back pain    Chronic sinus bradycardia    CKD (chronic kidney disease), stage III (HCC)    Clotting disorder ([place])    DVT   COLITIS 12/02/2007   Qualifier: Diagnosis of  By: [name], [name]     Diabetes mellitus without complication ([place])    DENIES   Diverticulosis of colon (without mention of hemorrhage) 2007   Colonoscopy    Esophageal stricture    a. 2012 s/p dil.   Esophagitis, unspecified    a. 2012 EGD   EXTERNAL HEMORRHOIDS    GERD (gastroesophageal reflux disease)    omeprazole   H/O hiatal hernia    Hiatal hernia    a. 2012 EGD.   History of  DVT in the past, not on Coumadin now    left  leg   HYPERCHOLESTEROLEMIA    Hypertension    ICD (implantable cardiac defibrillator) in place    a. s/p initial ICD in 1993 in setting of cardiac arrest;  b. 01/2007 gen change: Guidant T135 Vitality DS VR single lead ICD.   Macular degeneration    gets injecton in eye every 5 weeks- last injection - 05/03/2013    Myocardial infarction [facility]) 1993   Near syncope 05/22/2015   Nephrolithiasis, just saw Dr [name]    Orthostatic hypotension    Peripheral vascular disease ([place])    ???   RECTAL BLEEDING 12/03/2007   Qualifier: Diagnosis of  By: [name]    Unspecified gastritis and gastroduodenitis without mention of hemorrhage    a. 2003 EGD->not noted on 2012 EGD.   Unstable angina, neg MI, cath stable.maybe GI 12/30/2013    Past Surgical History:  Procedure Laterality Date   BREAST BIOPSY Left 11/2013   CARDIAC CATHETERIZATION     CARDIAC CATHETERIZATION  01/01/15   patent stents -there is severe disease in ostial 1st diag but without change.    CARDIAC DEFIBRILLATOR PLACEMENT  02/05/2007   Guidant   CARDIOVASCULAR STRESS TEST  12/30/2013   abnormal   CARDIOVERSION N/A 02/13/2019   Procedure: CARDIOVERSION;  Surgeon: Sanda Klein, MD;  Location: [facility];  Service: Cardiovascular;  Laterality: N/A;   CATARACT EXTRACTION Right    Dr. Katy Fitch   COLONOSCOPY     COLONOSCOPY WITH PROPOFOL N/A 03/30/2020   Procedure: COLONOSCOPY WITH PROPOFOL;  Surgeon: Harvel Quale, MD;  [facility];  Service: Gastroenterology;  Laterality: N/A;   coronary stents      CYSTOSCOPY WITH URETEROSCOPY Right 05/20/2013   Procedure: CYSTOSCOPY, RIGHT URETEROSCOPY STONE EXTRACTION, Insertion of right DOUBLE J STENT ;  Surgeon: Irine Seal, MD;  Location: [facility];  Service: Urology;  Laterality: Right;   CYSTOSCOPY WITH URETEROSCOPY AND STENT PLACEMENT N/A 06/03/2013   Procedure: SECOND LOOK CYSTOSCOPY WITH URETEROSCOPY   HOLMIUM LASER LITHO AND STONE EXTRACTION Sammie Bench ;  Surgeon: Malka So, MD;  Location: [facility];  Service: Urology;  Laterality: N/A;   ESOPHAGOGASTRODUODENOSCOPY (EGD) WITH PROPOFOL N/A 11/20/2019   Procedure: ESOPHAGOGASTRODUODENOSCOPY (EGD) WITH PROPOFOL;  Surgeon: Daneil Dolin, MD;  [facility];  Service: Endoscopy;  Laterality: N/A;   GIVENS CAPSULE STUDY N/A 10/30/2016   Procedure: GIVENS CAPSULE STUDY;  Surgeon: Irene Shipper, MD;  Location: [facility] ENDOSCOPY;  Service: Endoscopy;  Laterality: N/A;  NEEDS TO BE ADMITTED FOR OBSERVATION PT. HAS DEFIB   HEMORROIDECTOMY  80's   HOLMIUM LASER APPLICATION Right 04/18/5954   Procedure: HOLMIUM LASER APPLICATION;  Surgeon:  Irine Seal, MD;  Location: [facility];  Service: Urology;  Laterality: Right;   HYSTEROSCOPY WITH D & C  09/02/2010   Procedure: DILATATION AND CURETTAGE (D&C) /HYSTEROSCOPY;  Surgeon: Margarette Asal;  Location: [facility];  Service: Gynecology;  Laterality: N/A;  Dilation and Curettage with Hysteroscopy and Polypectomy   IMPLANTABLE CARDIOVERTER DEFIBRILLATOR (ICD) GENERATOR CHANGE N/A 02/10/2014   Procedure: ICD GENERATOR CHANGE;  Surgeon: Sanda Klein, MD;  Location: [facility] CATH LAB;  Service: Cardiovascular;  Laterality: N/A;   LEFT HEART CATHETERIZATION WITH CORONARY ANGIOGRAM N/A 07/28/2011   Procedure: LEFT HEART CATHETERIZATION WITH CORONARY ANGIOGRAM;  Surgeon: Lorretta Harp, MD;  Location: [facility] CATH LAB;  Service: Cardiovascular;  Laterality: N/A;   LEFT HEART CATHETERIZATION WITH CORONARY ANGIOGRAM N/A 12/31/2013   Procedure: LEFT HEART CATHETERIZATION WITH CORONARY ANGIOGRAM;  Surgeon: Wellington Hampshire, MD;  Location: [facility] CATH LAB;  Service: Cardiovascular;  Laterality: N/A;    Current Medications: Outpatient Medications Prior to Visit  Medication Sig Dispense Refill   Alpha-Lipoic Acid 600 MG TABS Take 600 mg by mouth daily. For neuropathy in legs 90 tablet 3   amiodarone (PACERONE) 200 MG tablet Take 1 tablet  (200 mg total) by mouth daily. 90 tablet 3   aspirin EC 81 MG EC tablet Take 1 tablet (81 mg total) by mouth daily. Swallow whole. 30 tablet 11   busPIRone (BUSPAR) 15 MG tablet TAKE ONE TABLET TWICE DAILY 180 tablet 0   Cholecalciferol (VITAMIN D) 50 MCG (2000 UT) CAPS Take 2,000 Units by mouth every morning.     ELIQUIS 2.5 MG TABS tablet TAKE (1) TABLET TWICE A DAY. 180 tablet 1   feeding supplement (ENSURE ENLIVE / ENSURE PLUS) LIQD Take 237 mLs by mouth 2 (two) times daily between meals.     furosemide (LASIX) 20 MG tablet Take 20 mg by mouth as needed.     gabapentin (NEURONTIN) 100 MG capsule TAKE UP TO 3 CAPSULES AT BEDTIME AS NEEDED 90 capsule 1   isosorbide mononitrate (IMDUR) 60 MG 24 hr tablet Take 1 tablet (60 mg total) by mouth daily. 30 tablet 11   levothyroxine (SYNTHROID) 100 MCG tablet TAKE ONE TABLET BY MOUTH EVERY MORNING 90 tablet 1   meclizine (ANTIVERT) 12.5 MG tablet Take 1 tablet (12.5 mg total) by mouth 3 (three) times daily as needed for dizziness. 30 tablet 0   MELATONIN PO Take 3 mg by mouth at bedtime.     metoprolol succinate (TOPROL-XL) 25 MG 24 hr tablet Take 0.5 tablets (12.5 mg total) by mouth daily. Take with or immediately following a meal. (Patient taking differently: Take 12.5 mg by mouth 2 (two) times daily. Take with or immediately following a meal.) 15 tablet 11   Multiple Vitamins-Minerals (CENTRUM SILVER ULTRA WOMENS PO) Take 1 tablet by mouth every morning.     pantoprazole (PROTONIX) 40 MG tablet TAKE 1 TABLET ONCE DAILY 90 tablet 0   potassium chloride SA (KLOR-CON) 20 MEQ tablet Take 20 mEq by mouth 3 (three) times a week.     rosuvastatin (CRESTOR) 10 MG tablet TAKE 1 TABLET DAILY 90 tablet 0   valsartan (DIOVAN) 40 MG tablet Take 0.5 tablets by mouth daily.     acetaminophen (TYLENOL) 500 MG tablet Take 1 tablet (500 mg total) by mouth every 6 (six) hours as needed for mild pain, fever or headache. (Patient not taking: Reported on 11/07/2021) 30  tablet 0   amLODipine (NORVASC) 2.5 MG tablet Take 1 tablet (2.5 mg  total) by mouth daily. Do NOT take if BP drops below 130 on top or below 60 on bottom. (Patient not taking: Reported on 11/07/2021) 90 tablet 3   carboxymethylcellulose (REFRESH PLUS) 0.5 % SOLN Place 1 drop into both eyes 3 (three) times daily as needed (dry/irritated eyes.). (Patient not taking: Reported on 11/07/2021)     latanoprost (XALATAN) 0.005 % ophthalmic solution Place 1 drop into the left eye at bedtime.     nitroGLYCERIN (NITROSTAT) 0.4 MG SL tablet PLACE 1 TABLET UNDER THE TONGUE AT ONSET OF CHEST PAIN EVERY 5 MINTUES UP TO 3 TIMES AS NEEDED (Patient not taking: Reported on 11/07/2021) 25 tablet 2   No facility-administered medications prior to visit.     Allergies:   Sotalol, Tramadol, Actos [pioglitazone], Adhesive [tape], Atorvastatin, Bactrim [sulfamethoxazole-trimethoprim], Ciprofloxacin, Contrast media [iodinated contrast media], Latex, Pedi-pre tape spray [wound dressing adhesive], and Sulfa antibiotics   Social History   Socioeconomic History   Marital status: Widowed    Spouse name: Not on file   Number of children: 4   Years of education: 8   Highest education level: 8th grade  Occupational History   Occupation: Retired  Tobacco Use   Smoking status: Never   Smokeless tobacco: Never  Vaping Use   Vaping Use: Never used  Substance and Sexual Activity   Alcohol use: No   Drug use: No   Sexual activity: Never    Birth control/protection: Post-menopausal  Other Topics Concern   Not on file  Social History Narrative   Lives alone - son visits Sundays, daughter visits Saturday   Has an aide Chiquita Loth) 5 days per week   Social Determinants of Health   Financial Resource Strain: Low Risk  (09/27/2020)   Overall Financial Resource Strain (CARDIA)    Difficulty of Paying Living Expenses: Not hard at all  Food Insecurity: No Food Insecurity (09/27/2020)   Hunger Vital Sign    Worried  About Running Out of Food in the Last Year: Never true    [place] in the Last Year: Never true  Transportation Needs: No Transportation Needs (09/27/2020)   PRAPARE - Hydrologist (Medical): No    Lack of Transportation (Non-Medical): No  Physical Activity: Inactive (03/15/2021)   Exercise Vital Sign    Days of Exercise per Week: 0 days    Minutes of Exercise per Session: 0 min  Stress: Stress Concern Present (03/15/2021)   [place]    Feeling of Stress : To some extent  Social Connections: Moderately Isolated (09/27/2020)   Social Connection and Isolation Panel [NHANES]    Frequency of Communication with Friends and Family: More than three times a week    Frequency of Social Gatherings with Friends and Family: Twice a week    Attends Religious Services: 1 to 4 times per year    Active Member of [employer] or Organizations: No    Attends Archivist Meetings: Never    Marital Status: Widowed     Family History:  The patient's family history includes Asthma in her brother; Atrial fibrillation in her sister; Cancer in her daughter; Colon polyps in her daughter; Diabetes in her daughter; Heart attack in her brother and father; Heart disease in her brother; Hyperlipidemia in her sister; Macular degeneration in her sister; Osteoporosis in her sister; Other in her father and mother; Stroke in her mother, sister, and sister; Uterine cancer  in her sister.   ROS:   Please see the history of present illness.    ROS All other systems are reviewed and are negative.   PHYSICAL EXAM:   VS:  BP (!) 140/62 (BP Location: Left Arm, Patient Position: Sitting, Cuff Size: Normal)   Pulse (!) 50   Ht 5\' 3"  (1.6 m)   Wt 59 kg   SpO2 99%   BMI 23.03 kg/m      General: Alert, oriented x3, no distress, The defibrillator sticks out prominently in the left subclavian area, but the overlying skin  appears healthy without redness, tenderness, adherence or other signs of infection or skin compromise. Head: no evidence of trauma, PERRL, EOMI, no exophtalmos or lid lag, no myxedema, no xanthelasma; normal ears, nose and oropharynx Neck: normal jugular venous pulsations and no hepatojugular reflux; brisk carotid pulses without delay and no carotid bruits Chest: clear to auscultation, no signs of consolidation by percussion or palpation, normal fremitus, symmetrical and full respiratory excursions Cardiovascular: normal position and quality of the apical impulse, regular rhythm, normal first and paradoxically split second heart sounds, no murmurs, rubs or gallops Abdomen: no tenderness or distention, no masses by palpation, no abnormal pulsatility or arterial bruits, normal bowel sounds, no hepatosplenomegaly Extremities: no clubbing, cyanosis or edema; 2+ radial, ulnar and brachial pulses bilaterally; 2+ right femoral, posterior tibial and dorsalis pedis pulses; 2+ left femoral, posterior tibial and dorsalis pedis pulses; no subclavian or femoral bruits Neurological: grossly nonfocal Psych: Normal mood and affect   Wt Readings from Last 3 Encounters:  11/07/21 59 kg  07/11/21 57.7 kg  05/19/21 57.4 kg    Studies/Labs Reviewed:   EKG: ECG is ordered today and shows sinus bradycardia 50 bpm, long PR interval 298 ms, LBBB with left axis deviation, QT 466 ms  Recent Labs: 02/08/2021: Magnesium 1.7 11/07/2021: ALT 15; BUN 20; Creatinine, Ser 1.48; Hemoglobin 9.9; Platelets 256; Potassium 4.1; Sodium 139; TSH 5.480   Lipid Panel    Component Value Date/Time   CHOL 150 02/04/2021 0431   CHOL 139 10/23/2018 0947   CHOL 140 07/31/2012 1019   TRIG 45 02/04/2021 0431   TRIG 115 06/07/2015 1111   TRIG 161 (H) 07/31/2012 1019   HDL 72 02/04/2021 0431   HDL 53 10/23/2018 0947   HDL 68 06/07/2015 1111   HDL 43 07/31/2012 1019   CHOLHDL 2.1 02/04/2021 0431   VLDL 9 02/04/2021 0431   LDLCALC 69  02/04/2021 0431   LDLCALC 65 10/23/2018 0947   LDLCALC 65 09/09/2013 0841   LDLCALC 65 07/31/2012 1019     ASSESSMENT:    1. Coronary artery disease of native artery of native heart with stable angina pectoris (HCC)   2. Paroxysmal atrial fibrillation ([place])   3. Chronic sinus bradycardia   4. Encounter for monitoring amiodarone therapy   5. VT (ventricular tachycardia) ([place])   6. Acquired thrombophilia ([place])   7. Stage 3b chronic kidney disease ([place])   8. Essential hypertension   9. Orthostatic hypotension   10. ICD (implantable cardioverter-defibrillator) in place   11. Hypercholesterolemia   12. Atherosclerosis of aorta ([place])   13. Cognitive decline   14. Diabetes mellitus type 2, diet-controlled ([place])       PLAN:  In order of problems listed above:  CAD/stable angina: Angina controlled with beta blocker + calcium channel blocker + long-acting nitrate  (I wonder if these explain her pounding/throbbing palpitations).  The doses are limted by bradycardia and previous orthostatic  hypotension issues.  Jamoni reiterates that she does not want to have any invasive procedures.  It is quite likely that she has new coronary lesions, but she does not want to have a catheterization.  Consequently, it does not make any sense to perform noninvasive study such as nuclear stress tests.    Although she has previously undergone 3 PCI-stent procedures she has not actually required revascularization since 2003.  She has undergone cardiac catheterization for chest pain with findings of patent coronary arteries in 2006, 2013 and 2015.  She is not the best candidate for invasive angiography in view of advanced age, diabetes mellitus, abnormal kidney function and need for anticoagulation, all of which place her at increased risk for complications.  We have had to decrease her beta-blocker dose due to bradycardia.  She becomes symptomatic when she has ventricular pacing.   AFib: I do not think there is  evidence of recent atrial fibrillation.It has been difficult to detect atrial fibrillation since she has a single chamber ICD.  She is clearly in normal rhythm today (sinus bradycardia).  Her heart rate histogram does not show any episodes of faster ventricular rates, which in the past had marked periods of atrial fibrillation.  On amiodarone and on appropriate anticoagulation with Eliquis. CHADSVasc 6 (age 67, gender, DM, CAD, hypertension).  We have discussed upgrading her device to a dual-chamber pacemaker in the past.  She has declined this and all other invasive procedures. Bradycardia: On amiodarone and a very low-dose of metoprolol succinate, due to unpleasant palpitations when she has ventricular pacing. Amiodarone: Has tolerated this well so far. This is indicated for prevention of both ventricular tachycardia and paroxysmal atrial fibrillation and seems to be doing an excellent job at both.  Recheck LFTs and TSH today. VT: Not even nonsustained VT detected recently, since amio initiated.  She has not had any therapy from her defibrillator since the early 1990s.  She did have a rather lengthy 25 beat nonsustained VT run in September 2020 when we decreased her beta-blocker dose due to bradycardia.   Eliquis: Dose adjusted for age and weight and she has had volatile renal function.   CKD 3: baseline creatinine 1.2-1.4.  Seeing Dr. Theador Hawthorne. HTN/Orthostatic hypotension: She has had 2 episodes of presyncope presumably due to orthostatic hypotension, but none recently.  Tolerate SBP around 140s ICD: Normal device function.  The device sticks out very prominently due to her weight loss, but the site still looks very healthy.  Asked her to promptly report any sign of skin adherence to the underlying device or any other signs of compromise.   HLP: all lipid parameters in good range on current meds Aortic atherosclerosis: Has a chronic focal dissection of the abdominal aorta.  Not a candidate for repair and  declines invasive procedures.   Cognitive decline: Stable. Short-term memory continues to be a problem, but without any significant deterioration. Her family member, Raymie Trani is with her and helps out.  She has a Psychologist, counselling during the day.  The records state that she had "hallucinations" during her hospitalization stay, probably sundowning/delirium Intestinal angina versus bowel hypersensitivity: weight is a little higher. DM: controlled without meds Discussions about end-of -life care: I have had a discussion with Shaddai and her family member Opal Sidles about the fact that the defibrillator therapies may become undesirable if her physical and cognitive decline continues.  At some point, she should have a discussion with her family about long-term wishes.  She has already filled out  DNR papers.  Reiterated the fact that if her quality of life deteriorates to a point that VT/VF therapies are not warranted, we can turn those therapies off, without compromising the monitoring or pacing features of her device.   Medication Adjustments/Labs and Tests Ordered: Current medicines are reviewed at length with the patient today.  Concerns regarding medicines are outlined above.  Medication changes, Labs and Tests ordered today are listed in the Patient Instructions below. Patient Instructions  Medication Instructions:  TAKE Valsartan 20 mg (half a tablet) as needed for a systolic over 591  *If you need a refill on your cardiac medications before your next appointment, please call your pharmacy*   Lab Work: Your provider would like for you to have the following labs today: CBC, CMET and TSH  If you have labs (blood work) drawn today and your tests are completely normal, you will receive your results only by: [place] (if you have MyChart) OR A paper copy in the mail If you have any lab test that is abnormal or we need to change your treatment, we will call you to review the  results.   Testing/Procedures: None ordered   Follow-Up: At [facility], you and your health needs are our priority.  As part of our continuing mission to provide you with exceptional heart care, we have created designated Provider Care Teams.  These Care Teams include your primary Cardiologist (physician) and Advanced Practice Providers (APPs -  Physician Assistants and Nurse Practitioners) who all work together to provide you with the care you need, when you need it.  We recommend signing up for the patient portal called "MyChart".  Sign up information is provided on this After Visit Summary.  MyChart is used to connect with patients for Virtual Visits (Telemedicine).  Patients are able to view lab/test results, encounter notes, upcoming appointments, etc.  Non-urgent messages can be sent to your provider as well.   To learn more about what you can do with MyChart, go to [email or web address].    Your next appointment:   6 month(s)  The format for your next appointment:   In Person  Provider:   Sanda Klein, MD         Signed, Sanda Klein, MD  11/08/2021 10:05 PM    [address], [place], [place]  [address] Phone: [phone]; Fax: [phone]

## 2021-11-08 ENCOUNTER — Other Ambulatory Visit: Payer: Self-pay | Admitting: *Deleted

## 2021-11-08 DIAGNOSIS — Z5181 Encounter for therapeutic drug level monitoring: Secondary | ICD-10-CM

## 2021-11-08 DIAGNOSIS — I480 Paroxysmal atrial fibrillation: Secondary | ICD-10-CM

## 2021-11-08 DIAGNOSIS — I25119 Atherosclerotic heart disease of native coronary artery with unspecified angina pectoris: Secondary | ICD-10-CM

## 2021-11-08 LAB — COMPREHENSIVE METABOLIC PANEL
ALT: 15 IU/L (ref 0–32)
AST: 25 IU/L (ref 0–40)
Albumin/Globulin Ratio: 1.9 (ref 1.2–2.2)
Albumin: 4 g/dL (ref 3.7–4.7)
Alkaline Phosphatase: 87 IU/L (ref 44–121)
BUN/Creatinine Ratio: 14 (ref 12–28)
BUN: 20 mg/dL (ref 8–27)
Bilirubin Total: 0.4 mg/dL (ref 0.0–1.2)
CO2: 22 mmol/L (ref 20–29)
Calcium: 9.3 mg/dL (ref 8.7–10.3)
Chloride: 105 mmol/L (ref 96–106)
Creatinine, Ser: 1.48 mg/dL — ABNORMAL HIGH (ref 0.57–1.00)
Globulin, Total: 2.1 g/dL (ref 1.5–4.5)
Glucose: 221 mg/dL — ABNORMAL HIGH (ref 70–99)
Potassium: 4.1 mmol/L (ref 3.5–5.2)
Sodium: 139 mmol/L (ref 134–144)
Total Protein: 6.1 g/dL (ref 6.0–8.5)
eGFR: 34 mL/min/{1.73_m2} — ABNORMAL LOW (ref >59)

## 2021-11-08 LAB — CBC
Hematocrit: 30 % — ABNORMAL LOW (ref 34.0–46.6)
Hemoglobin: 9.9 g/dL — ABNORMAL LOW (ref 11.1–15.9)
MCH: 29.3 pg (ref 26.6–33.0)
MCHC: 33 g/dL (ref 31.5–35.7)
MCV: 89 fL (ref 79–97)
NRBC: 1 % — ABNORMAL HIGH (ref 0–0)
Platelets: 256 10*3/uL (ref 150–450)
RBC: 3.38 x10E6/uL — ABNORMAL LOW (ref 3.77–5.28)
RDW: 15.3 % (ref 11.7–15.4)
WBC: 6 10*3/uL (ref 3.4–10.8)

## 2021-11-08 LAB — TSH: TSH: 5.48 u[IU]/mL — ABNORMAL HIGH (ref 0.450–4.500)

## 2021-11-24 ENCOUNTER — Encounter (INDEPENDENT_AMBULATORY_CARE_PROVIDER_SITE_OTHER): Payer: Medicare Other | Admitting: Ophthalmology

## 2021-12-09 DIAGNOSIS — Y92009 Unspecified place in unspecified non-institutional (private) residence as the place of occurrence of the external cause: Secondary | ICD-10-CM | POA: Insufficient documentation

## 2021-12-09 DIAGNOSIS — S20211A Contusion of right front wall of thorax, initial encounter: Secondary | ICD-10-CM | POA: Insufficient documentation

## 2021-12-09 DIAGNOSIS — W19XXXA Unspecified fall, initial encounter: Secondary | ICD-10-CM | POA: Insufficient documentation

## 2021-12-12 DIAGNOSIS — R2990 Unspecified symptoms and signs involving the nervous system: Secondary | ICD-10-CM | POA: Insufficient documentation

## 2022-01-18 ENCOUNTER — Ambulatory Visit (INDEPENDENT_AMBULATORY_CARE_PROVIDER_SITE_OTHER): Payer: Medicare (Managed Care)

## 2022-01-18 DIAGNOSIS — I4819 Other persistent atrial fibrillation: Secondary | ICD-10-CM | POA: Diagnosis not present

## 2022-01-19 LAB — CUP PACEART REMOTE DEVICE CHECK
Battery Remaining Longevity: 114 mo
Battery Remaining Percentage: 100 %
Brady Statistic RV Percent Paced: 0 %
Date Time Interrogation Session: 20240103092300
HighPow Impedance: 39 Ohm
Implantable Lead Connection Status: 753985
Implantable Lead Implant Date: 20090120
Implantable Lead Location: 753860
Implantable Lead Model: 157
Implantable Lead Serial Number: 138237
Implantable Pulse Generator Implant Date: 20160126
Lead Channel Impedance Value: 510 Ohm
Lead Channel Pacing Threshold Amplitude: 1 V
Lead Channel Pacing Threshold Pulse Width: 0.5 ms
Lead Channel Setting Pacing Amplitude: 2.2 V
Lead Channel Setting Pacing Pulse Width: 0.5 ms
Lead Channel Setting Sensing Sensitivity: 0.6 mV
Pulse Gen Serial Number: 191956

## 2022-02-07 NOTE — Progress Notes (Signed)
[facility] Note  02/14/2022     CHIEF COMPLAINT Patient presents for Retina Evaluation   HISTORY OF PRESENT ILLNESS: [name] is a 89 y.o. female who presents to the clinic today for:   HPI     Retina Evaluation   In both eyes.  I, the attending physician,  performed the HPI with the patient and updated documentation appropriately.        Comments   Patient here for Retina Evaluation. Patient states vision had cataract surgery OS Jan 7th by Dr Katy Fitch. Seems to be a little bit better. No eye pain. Uses drops for after cataract surgery and latanoprost QHS OS.       Last edited by Bernarda Caffey, MD on 02/14/2022  5:03 PM.    Dr. Zadie Rhine pt here due to insurance purposes, pt was receiving IVA OD q6-8 weeks, but her last injection was in September, pt recently had cataract sx OS with Dr. Shirleen Schirmer, pt feels like vision is not as good  Referring physician: Janifer Adie, MD No address on file  HISTORICAL INFORMATION:   Selected notes from the [name] -- transferring care due to insurance LEE: 09.07.23 -- BCVA CF OU Ocular Hx- ex ARMD OD -- s/p multiple IVA, last IVA OD 09.07.24 (4+ mos) PMH-    CURRENT MEDICATIONS: Current Outpatient Medications (Ophthalmic Drugs)  Medication Sig   latanoprost (XALATAN) 0.005 % ophthalmic solution Place 1 drop into the left eye at bedtime.   carboxymethylcellulose (REFRESH PLUS) 0.5 % SOLN Place 1 drop into both eyes 3 (three) times daily as needed (dry/irritated eyes.). (Patient not taking: Reported on 11/07/2021)   No current facility-administered medications for this visit. (Ophthalmic Drugs)   Current Outpatient Medications (Other)  Medication Sig   Alpha-Lipoic Acid 600 MG TABS Take 600 mg by mouth daily. For neuropathy in legs   amiodarone (PACERONE) 200 MG tablet Take 1 tablet (200 mg total) by mouth daily.   aspirin EC 81 MG EC tablet Take 1 tablet (81 mg total) by  mouth daily. Swallow whole.   Cholecalciferol (VITAMIN D) 50 MCG (2000 UT) CAPS Take 2,000 Units by mouth every morning.   ELIQUIS 2.5 MG TABS tablet TAKE (1) TABLET TWICE A DAY.   feeding supplement (ENSURE ENLIVE / ENSURE PLUS) LIQD Take 237 mLs by mouth 2 (two) times daily between meals.   gabapentin (NEURONTIN) 100 MG capsule TAKE UP TO 3 CAPSULES AT BEDTIME AS NEEDED   isosorbide mononitrate (IMDUR) 60 MG 24 hr tablet Take 1 tablet (60 mg total) by mouth daily.   levothyroxine (SYNTHROID) 100 MCG tablet TAKE ONE TABLET BY MOUTH EVERY MORNING   meclizine (ANTIVERT) 12.5 MG tablet Take 1 tablet (12.5 mg total) by mouth 3 (three) times daily as needed for dizziness.   MELATONIN PO Take 3 mg by mouth at bedtime.   metoprolol tartrate (LOPRESSOR) 25 MG tablet Take 25 mg by mouth daily. Once daily   Multiple Vitamins-Minerals (CENTRUM SILVER ULTRA WOMENS PO) Take 1 tablet by mouth every morning.   nitroGLYCERIN (NITROSTAT) 0.4 MG SL tablet PLACE 1 TABLET UNDER THE TONGUE AT ONSET OF CHEST PAIN EVERY 5 MINTUES UP TO 3 TIMES AS NEEDED   pantoprazole (PROTONIX) 40 MG tablet TAKE 1 TABLET ONCE DAILY   rosuvastatin (CRESTOR) 10 MG tablet TAKE 1 TABLET DAILY   valsartan (DIOVAN) 40 MG tablet Take 0.5 tablets (20 mg total) by mouth daily as needed (For  a systolic over 176).   venlafaxine (EFFEXOR) 37.5 MG tablet Take 37.5 mg by mouth daily. Once a day at noon.   acetaminophen (TYLENOL) 500 MG tablet Take 1 tablet (500 mg total) by mouth every 6 (six) hours as needed for mild pain, fever or headache. (Patient not taking: Reported on 11/07/2021)   amLODipine (NORVASC) 2.5 MG tablet Take 1 tablet (2.5 mg total) by mouth daily. Do NOT take if BP drops below 130 on top or below 60 on bottom. (Patient not taking: Reported on 11/07/2021)   busPIRone (BUSPAR) 15 MG tablet TAKE ONE TABLET TWICE DAILY (Patient not taking: Reported on 02/14/2022)   furosemide (LASIX) 20 MG tablet Take 20 mg by mouth as needed.  (Patient not taking: Reported on 02/14/2022)   metoprolol succinate (TOPROL-XL) 25 MG 24 hr tablet Take 0.5 tablets (12.5 mg total) by mouth daily. Take with or immediately following a meal. (Patient taking differently: Take 12.5 mg by mouth 2 (two) times daily. Take with or immediately following a meal.)   potassium chloride SA (KLOR-CON) 20 MEQ tablet Take 20 mEq by mouth 3 (three) times a week. (Patient not taking: Reported on 02/14/2022)   No current facility-administered medications for this visit. (Other)   REVIEW OF SYSTEMS: ROS   Positive for: Cardiovascular, Eyes Negative for: Constitutional, Gastrointestinal, Neurological, Skin, Genitourinary, Musculoskeletal, HENT, Endocrine, Respiratory, Psychiatric, Allergic/Imm, Heme/Lymph Last edited by Theodore Demark, COA on 02/14/2022 10:00 AM.     ALLERGIES Allergies  Allergen Reactions   Sotalol Other (See Comments)    Torsades    Tramadol Other (See Comments)    Hallucination, bad-dream   Actos [Pioglitazone] Swelling   Adhesive [Tape] Other (See Comments)    Causes sores   Atorvastatin Other (See Comments)    myalgia   Bactrim [Sulfamethoxazole-Trimethoprim] Itching and Rash    Itchy rash   Ciprofloxacin Nausea Only   Contrast Media [Iodinated Contrast Media] Other (See Comments)    Headache (no action/pre-med required)   Latex Rash   Pedi-Pre Tape Spray [Wound Dressing Adhesive] Other (See Comments)    Causes sores   Sulfa Antibiotics Other (See Comments)    Causes sores   PAST MEDICAL HISTORY Past Medical History:  Diagnosis Date   AICD (automatic cardioverter/defibrillator) present    Allergy    SESONAL   Aneurysm of infrarenal abdominal aorta (HCC)    Anxiety    ARTHRITIS    Arthritis    Atrial fibrillation (HCC)    CAD (coronary artery disease)    a. history of cardiac arrest 1993;  b. s/p LAD/LCX stenting in 2003;  c. 07/2011 Cath: LM nl, LAD patent stent, D1 80ost, LCX patent stent, RCA min irregs;  d.  04/2012 MV: EF 66%, no ishcemia;  e. 12/2013 Echo: Ef 55%, no rwma, Gr 1 DD, triv AI/MR, mildly dil LA;  f. 12/2013 Lexi MV: intermediate risk - apical ischemia and inf/infsept fixed defect, ? artifactual.   CAD in native artery 12/31/2013   Previous stents to LAD and Ramus patent on cath today 12/31/13 also There is severe disease in the ostial first diagonal which is unchanged from most recent cardiac catheterization. The right coronary artery could not be engaged selectively but nonselective angiography showed no significant disease in the proximal and midsegment.       Cataract    DENIES   Chronic back pain    Chronic sinus bradycardia    CKD (chronic kidney disease), stage III (HCC)    Clotting disorder (  [place])    DVT   COLITIS 12/02/2007   Qualifier: Diagnosis of  By: [name], [name]     Diabetes mellitus without complication [facility])    DENIES   Diverticulosis of colon (without mention of hemorrhage) 2007   Colonoscopy    Esophageal stricture    a. 2012 s/p dil.   Esophagitis, unspecified    a. 2012 EGD   EXTERNAL HEMORRHOIDS    GERD (gastroesophageal reflux disease)    omeprazole   H/O hiatal hernia    Hiatal hernia    a. 2012 EGD.   History of DVT in the past, not on Coumadin now    left  leg   HYPERCHOLESTEROLEMIA    Hypertension    ICD (implantable cardiac defibrillator) in place    a. s/p initial ICD in 1993 in setting of cardiac arrest;  b. 01/2007 gen change: Guidant T135 Vitality DS VR single lead ICD.   Macular degeneration    gets injecton in eye every 5 weeks- last injection - 05/03/2013    Myocardial infarction [facility]) 1993   Near syncope 05/22/2015   Nephrolithiasis, just saw Dr [name]    Orthostatic hypotension    Peripheral vascular disease ([place])    ???   RECTAL BLEEDING 12/03/2007   Qualifier: Diagnosis of  By: [name]    Unspecified gastritis and gastroduodenitis without mention of hemorrhage    a. 2003 EGD->not noted on 2012  EGD.   Unstable angina, neg MI, cath stable.maybe GI 12/30/2013   Past Surgical History:  Procedure Laterality Date   BREAST BIOPSY Left 11/2013   CARDIAC CATHETERIZATION     CARDIAC CATHETERIZATION  01/01/15   patent stents -there is severe disease in ostial 1st diag but without change.    CARDIAC DEFIBRILLATOR PLACEMENT  02/05/2007   Guidant   CARDIOVASCULAR STRESS TEST  12/30/2013   abnormal   CARDIOVERSION N/A 02/13/2019   Procedure: CARDIOVERSION;  Surgeon: Sanda Klein, MD;  Location: [facility];  Service: Cardiovascular;  Laterality: N/A;   CATARACT EXTRACTION Right    Dr. Katy Fitch   COLONOSCOPY     COLONOSCOPY WITH PROPOFOL N/A 03/30/2020   Procedure: COLONOSCOPY WITH PROPOFOL;  Surgeon: Harvel Quale, MD;  [facility];  Service: Gastroenterology;  Laterality: N/A;   coronary stents      CYSTOSCOPY WITH URETEROSCOPY Right 05/20/2013   Procedure: CYSTOSCOPY, RIGHT URETEROSCOPY STONE EXTRACTION, Insertion of right DOUBLE J STENT ;  Surgeon: Irine Seal, MD;  Location: [facility];  Service: Urology;  Laterality: Right;   CYSTOSCOPY WITH URETEROSCOPY AND STENT PLACEMENT N/A 06/03/2013   Procedure: SECOND LOOK CYSTOSCOPY WITH URETEROSCOPY  HOLMIUM LASER LITHO AND STONE EXTRACTION Sammie Bench ;  Surgeon: Malka So, MD;  Location: [facility];  Service: Urology;  Laterality: N/A;   ESOPHAGOGASTRODUODENOSCOPY (EGD) WITH PROPOFOL N/A 11/20/2019   Procedure: ESOPHAGOGASTRODUODENOSCOPY (EGD) WITH PROPOFOL;  Surgeon: Daneil Dolin, MD;  [facility];  Service: Endoscopy;  Laterality: N/A;   GIVENS CAPSULE STUDY N/A 10/30/2016   Procedure: GIVENS CAPSULE STUDY;  Surgeon: Irene Shipper, MD;  Location: [facility] ENDOSCOPY;  Service: Endoscopy;  Laterality: N/A;  NEEDS TO BE ADMITTED FOR OBSERVATION PT. HAS DEFIB   HEMORROIDECTOMY  80's   HOLMIUM LASER APPLICATION Right 0/02/5850   Procedure: HOLMIUM LASER APPLICATION;  Surgeon: Irine Seal, MD;  Location: [facility];  Service:  Urology;  Laterality: Right;   HYSTEROSCOPY WITH D & C  09/02/2010   Procedure:  DILATATION AND CURETTAGE (D&C) /HYSTEROSCOPY;  Surgeon: Margarette Asal;  Location: [facility] [facility];  Service: Gynecology;  Laterality: N/A;  Dilation and Curettage with Hysteroscopy and Polypectomy   IMPLANTABLE CARDIOVERTER DEFIBRILLATOR (ICD) GENERATOR CHANGE N/A 02/10/2014   Procedure: ICD GENERATOR CHANGE;  Surgeon: Sanda Klein, MD;  Location: [place] [facility] CATH LAB;  Service: Cardiovascular;  Laterality: N/A;   LEFT HEART CATHETERIZATION WITH CORONARY ANGIOGRAM N/A 07/28/2011   Procedure: LEFT HEART CATHETERIZATION WITH CORONARY ANGIOGRAM;  Surgeon: Lorretta Harp, MD;  Location: [facility] CATH LAB;  Service: Cardiovascular;  Laterality: N/A;   LEFT HEART CATHETERIZATION WITH CORONARY ANGIOGRAM N/A 12/31/2013   Procedure: LEFT HEART CATHETERIZATION WITH CORONARY ANGIOGRAM;  Surgeon: Wellington Hampshire, MD;  Location: [place] CATH LAB;  Service: Cardiovascular;  Laterality: N/A;   FAMILY HISTORY Family History  Problem Relation Age of Onset   Stroke Mother    Other Mother        brain tumor   Other Father        MI   Heart attack Father    Stroke Sister    Macular degeneration Sister    Diabetes Daughter    Cancer Daughter        ovarian   Colon polyps Daughter    Atrial fibrillation Sister    Hyperlipidemia Sister    Osteoporosis Sister    Stroke Sister    Uterine cancer Sister    Heart attack Brother    Heart disease Brother    Asthma Brother    Breast cancer Neg Hx    SOCIAL HISTORY Social History   Tobacco Use   Smoking status: Never   Smokeless tobacco: Never  Vaping Use   Vaping Use: Never used  Substance Use Topics   Alcohol use: No   Drug use: No       OPHTHALMIC EXAM:  Base Eye Exam     Visual Acuity (Snellen - Linear)       Right Left   Dist [place] 20/150 20/150 -2   Dist ph  NI 20/100 -1         Tonometry (Tonopen, 9:56 AM)       Right Left   Pressure 11 11         Pupils       Dark  Light Shape React APD   Right 3 2 Round Minimal None   Left 3 2 Round Minimal None         Visual Fields (Counting fingers)       Left Right    Full Full         Extraocular Movement       Right Left    Full, Ortho Full, Ortho         Neuro/Psych     Oriented x3: Yes   Mood/Affect: Normal         Dilation     Both eyes: 1.0% Mydriacyl, 2.5% Phenylephrine @ 9:56 AM           Slit Lamp and Fundus Exam     Slit Lamp Exam       Right Left   Lids/Lashes Dermatochalasis - upper lid Dermatochalasis - upper lid   Conjunctiva/Sclera White and quiet White and quiet   Cornea 1-2+ Punctate epithelial erosions, well healed cataract wound 2+ fine Punctate epithelial erosions, well healed cataract wound   Anterior Chamber deep and clear deep and clear   Iris Round and dilated Round and dilated   Lens PC IOL  in good position PC IOL in good position   Anterior Vitreous mild syneresis, Posterior vitreous detachment mild syneresis, silicone oil microbubbles         Fundus Exam       Right Left   Disc mild Pallor, Sharp rim, temporal PPA mild Pallor, Sharp rim, temporal PPA   C/D Ratio 0.1 0.1   Macula Blunted foveal reflex, central GA with punctate heme superior macula, Drusen, RPE mottling and clumping, +CNV Blunted foveal reflex, large area of GA centrally, Drusen, RPE mottling, clumping and atrophy   Vessels attenuated, Tortuous attenuated, mild tortuosity   Periphery Attached, reticular degeneration, No heme Attached, reticular degeneration, No heme           IMAGING AND PROCEDURES  Imaging and Procedures for 02/14/2022  OCT, Retina - OU - Both Eyes       Right Eye Quality was good. Central Foveal Thickness: 322. Progression has no prior data. Findings include normal foveal contour, no IRF, no SRF, retinal drusen , subretinal hyper-reflective material, pigment epithelial detachment, outer retinal atrophy (Central ORA with scattered PED / [facility]).   Left  Eye Quality was good. Central Foveal Thickness: 209. Progression has no prior data. Findings include normal foveal contour, no IRF, no SRF, retinal drusen , subretinal hyper-reflective material, outer retinal atrophy (Central ORA with [facility]).   Notes *Images captured and stored on drive  Diagnosis / Impression:  OD: Exudative ARMD - Central ORA with scattered PED / SRHM OS: Nonexudative ARMD - Central ORA with SRHM  Clinical management:  See below  Abbreviations: NFP - Normal foveal profile. CME - cystoid macular edema. PED - pigment epithelial detachment. IRF - intraretinal fluid. SRF - subretinal fluid. EZ - ellipsoid zone. ERM - epiretinal membrane. ORA - outer retinal atrophy. ORT - outer retinal tubulation. SRHM - subretinal hyper-reflective material. IRHM - intraretinal hyper-reflective material      Intravitreal Injection, Pharmacologic Agent - OD - Right Eye       Time Out 02/14/2022. 11:33 AM. Confirmed correct patient, procedure, site, and patient consented.   Anesthesia Topical anesthesia was used. Anesthetic medications included Lidocaine 2%, Proparacaine 0.5%.   Procedure Preparation included 5% betadine to ocular surface, eyelid speculum. A (32g) needle was used.   Injection: 1.25 mg Bevacizumab 1.25mg /0.34ml   Route: Intravitreal, Site: Right Eye   NDC: H061816, Lot: [id number], Expiration date: 03/17/2022   Post-op Post injection exam found visual acuity of at least counting fingers. The patient tolerated the procedure well. There were no complications. The patient received written and verbal post procedure care education. Post injection medications were not given.            ASSESSMENT/PLAN:    ICD-10-CM   1. Exudative age-related macular degeneration of right eye with active choroidal neovascularization (HCC)  H35.3211 OCT, Retina - OU - Both Eyes    Intravitreal Injection, Pharmacologic Agent - OD - Right Eye    Bevacizumab (AVASTIN) SOLN 1.25 mg     2. Advanced atrophic nonexudative age-related macular degeneration of left eye with subfoveal involvement  H35.3124 OCT, Retina - OU - Both Eyes    3. Essential hypertension  I10     4. Hypertensive retinopathy of both eyes  H35.033     5. Pseudophakia, both eyes  Z96.1       Exudative age related macular degeneration, right eye - previously managed by Dr. Zadie Rhine - last visit and injection was 09.07.24 (IVA OD) -- pt was on a q6-8 wk maintenance  schedule -- now 4+ mos since last IVA OD  - The incidence pathology and anatomy of wet AMD discussed   - The ANCHOR, MARINA, CATT and VIEW trials discussed with patient.    - discussed treatment options including observation vs intravitreal anti-VEGF agents such as Avastin, Lucentis, Eylea.    - Risks of endophthalmitis and vascular occlusive events and atrophic changes discussed with patient  - BCVA 20/150 OD  - OCT OD: Central ORA with scattered PED / [facility]  - exam shows punctate IRH centrally  - recommend IVA OD #1 today, 01.30.24 for punctate IRH and maintenance tx w/ f/u in 6 wks  - pt wishes to proceed with injection  - RBA of procedure discussed, questions answered - IVA informed consent obtained and signed 01.30.24 - see procedure note  - f/u in 6 wks, DFE, OCT, possible injxn  2. Age related macular degeneration, non-exudative, left eye  - advanced stage with large central area of GA  - BCVA 20/100 OS  - The incidence, anatomy, and pathology of dry AMD, risk of progression, and the AREDS and AREDS 2 study including smoking risks discussed with patient.  - Recommend amsler grid monitoring  3,4. Hypertensive retinopathy OU - discussed importance of tight BP control - monitor  5. Pseudophakia OU  - s/p CE/IOL OU (Dr. Shirleen Schirmer)  - IOLs in good position, doing well  - monitor  Ophthalmic Meds Ordered this visit:  Meds ordered this encounter  Medications   Bevacizumab (AVASTIN) SOLN 1.25 mg     Return in about 6 weeks  (around 03/28/2022) for f/u exu ARMD OD, DFE, OCT.  There are no Patient Instructions on file for this visit.   Explained the diagnoses, plan, and follow up with the patient and they expressed understanding.  Patient expressed understanding of the importance of proper follow up care.   This document serves as a record of services personally performed by Gardiner Sleeper, MD, PhD. It was created on their behalf by Renaldo Reel, [place] an ophthalmic technician. The creation of this record is the provider's dictation and/or activities during the visit.    Electronically signed by:  Renaldo Reel, COT  01.23.24 5:19 PM  This document serves as a record of services personally performed by Gardiner Sleeper, MD, PhD. It was created on their behalf by San Jetty. [name], OA an ophthalmic technician. The creation of this record is the provider's dictation and/or activities during the visit.    Electronically signed by: San Jetty. [name], [place] 01.30.2024 5:19 PM  Gardiner Sleeper, M.D., Ph.D. Diseases & Surgery of the Retina and Vitreous [facility]  I have reviewed the above documentation for accuracy and completeness, and I agree with the above. Gardiner Sleeper, M.D., Ph.D. 02/14/22 5:19 PM  Abbreviations: M myopia (nearsighted); A astigmatism; H hyperopia (farsighted); P presbyopia; Mrx spectacle prescription;  CTL contact lenses; OD right eye; OS left eye; OU both eyes  XT exotropia; ET esotropia; PEK punctate epithelial keratitis; PEE punctate epithelial erosions; DES dry eye syndrome; MGD meibomian gland dysfunction; ATs artificial tears; PFAT's preservative free artificial tears; [place] nuclear sclerotic cataract; PSC posterior subcapsular cataract; ERM epi-retinal membrane; PVD posterior vitreous detachment; RD retinal detachment; DM diabetes mellitus; DR diabetic retinopathy; NPDR non-proliferative diabetic retinopathy; PDR proliferative diabetic retinopathy; CSME clinically  significant macular edema; DME diabetic macular edema; dbh dot blot hemorrhages; CWS cotton wool spot; POAG primary open angle glaucoma; C/D cup-to-disc ratio; HVF humphrey visual field;  GVF goldmann visual field; OCT optical coherence tomography; IOP intraocular pressure; BRVO Branch retinal vein occlusion; CRVO central retinal vein occlusion; CRAO central retinal artery occlusion; BRAO branch retinal artery occlusion; RT retinal tear; SB scleral buckle; PPV pars plana vitrectomy; VH Vitreous hemorrhage; PRP panretinal laser photocoagulation; IVK intravitreal kenalog; VMT vitreomacular traction; MH Macular hole;  NVD neovascularization of the disc; NVE neovascularization elsewhere; AREDS age related eye disease study; ARMD age related macular degeneration; POAG primary open angle glaucoma; EBMD epithelial/anterior basement membrane dystrophy; ACIOL anterior chamber intraocular lens; IOL intraocular lens; PCIOL posterior chamber intraocular lens; Phaco/IOL phacoemulsification with intraocular lens placement; [place] photorefractive keratectomy; LASIK laser assisted in situ keratomileusis; HTN hypertension; DM diabetes mellitus; COPD chronic obstructive pulmonary disease

## 2022-02-13 NOTE — Progress Notes (Signed)
Remote ICD transmission.   

## 2022-02-14 ENCOUNTER — Ambulatory Visit (INDEPENDENT_AMBULATORY_CARE_PROVIDER_SITE_OTHER): Payer: Medicare (Managed Care) | Admitting: Ophthalmology

## 2022-02-14 ENCOUNTER — Encounter (INDEPENDENT_AMBULATORY_CARE_PROVIDER_SITE_OTHER): Payer: Self-pay | Admitting: Ophthalmology

## 2022-02-14 DIAGNOSIS — H353211 Exudative age-related macular degeneration, right eye, with active choroidal neovascularization: Secondary | ICD-10-CM | POA: Diagnosis not present

## 2022-02-14 DIAGNOSIS — I10 Essential (primary) hypertension: Secondary | ICD-10-CM

## 2022-02-14 DIAGNOSIS — H35033 Hypertensive retinopathy, bilateral: Secondary | ICD-10-CM | POA: Diagnosis not present

## 2022-02-14 DIAGNOSIS — H353222 Exudative age-related macular degeneration, left eye, with inactive choroidal neovascularization: Secondary | ICD-10-CM

## 2022-02-14 DIAGNOSIS — H353124 Nonexudative age-related macular degeneration, left eye, advanced atrophic with subfoveal involvement: Secondary | ICD-10-CM | POA: Diagnosis not present

## 2022-02-14 DIAGNOSIS — Z961 Presence of intraocular lens: Secondary | ICD-10-CM

## 2022-02-14 MED ORDER — BEVACIZUMAB CHEMO INJECTION 1.25MG/0.05ML SYRINGE FOR KALEIDOSCOPE
1.2500 mg | INTRAVITREAL | Status: AC | PRN
  Administered 2022-02-14: 1.25 mg via INTRAVITREAL

## 2022-03-14 NOTE — Progress Notes (Signed)
[facility] Note  03/28/2022     CHIEF COMPLAINT Patient presents for Retina Follow Up   HISTORY OF PRESENT ILLNESS: [name] is a 89 y.o. female who presents to the clinic today for:   HPI     Retina Follow Up   Patient presents with  Wet AMD.  In right eye.  This started 6 weeks ago.  I, the attending physician,  performed the HPI with the patient and updated documentation appropriately.        Comments   Patient here for 5 weeks retina follow up for exu ARMD OD. Patient states vision about the same. No eye pain. Using drops. Using refresh BID OU.      Last edited by Bernarda Caffey, MD on 03/28/2022 12:22 PM.     Patient states vision the same OU.  Referring physician: No referring provider defined for this encounter.  HISTORICAL INFORMATION:   Selected notes from the MEDICAL RECORD NUMBER [name] -- transferring care due to insurance LEE: 09.07.23 -- BCVA CF OU Ocular Hx- ex ARMD OD -- s/p multiple IVA, last IVA OD 09.07.24 (4+ mos) PMH-    CURRENT MEDICATIONS: Current Outpatient Medications (Ophthalmic Drugs)  Medication Sig   latanoprost (XALATAN) 0.005 % ophthalmic solution Place 1 drop into the left eye at bedtime.   carboxymethylcellulose (REFRESH PLUS) 0.5 % SOLN Place 1 drop into both eyes 3 (three) times daily as needed (dry/irritated eyes.). (Patient not taking: Reported on 11/07/2021)   No current facility-administered medications for this visit. (Ophthalmic Drugs)   Current Outpatient Medications (Other)  Medication Sig   Alpha-Lipoic Acid 600 MG TABS Take 600 mg by mouth daily. For neuropathy in legs   amiodarone (PACERONE) 200 MG tablet Take 1 tablet (200 mg total) by mouth daily.   amLODipine (NORVASC) 2.5 MG tablet Take 1 tablet (2.5 mg total) by mouth daily. Do NOT take if BP drops below 130 on top or below 60 on bottom.   aspirin EC 81 MG EC tablet Take 1 tablet (81 mg total) by mouth daily. Swallow whole.    Cholecalciferol (VITAMIN D) 50 MCG (2000 UT) CAPS Take 2,000 Units by mouth every morning.   ELIQUIS 2.5 MG TABS tablet TAKE (1) TABLET TWICE A DAY.   feeding supplement (ENSURE ENLIVE / ENSURE PLUS) LIQD Take 237 mLs by mouth 2 (two) times daily between meals.   gabapentin (NEURONTIN) 100 MG capsule TAKE UP TO 3 CAPSULES AT BEDTIME AS NEEDED   isosorbide mononitrate (IMDUR) 60 MG 24 hr tablet Take 1 tablet (60 mg total) by mouth daily.   levothyroxine (SYNTHROID) 100 MCG tablet TAKE ONE TABLET BY MOUTH EVERY MORNING   MELATONIN PO Take 3 mg by mouth at bedtime.   metoprolol tartrate (LOPRESSOR) 25 MG tablet Take 25 mg by mouth daily. Once daily   Multiple Vitamins-Minerals (CENTRUM SILVER ULTRA WOMENS PO) Take 1 tablet by mouth every morning.   nitroGLYCERIN (NITROSTAT) 0.4 MG SL tablet PLACE 1 TABLET UNDER THE TONGUE AT ONSET OF CHEST PAIN EVERY 5 MINTUES UP TO 3 TIMES AS NEEDED   pantoprazole (PROTONIX) 40 MG tablet TAKE 1 TABLET ONCE DAILY   rosuvastatin (CRESTOR) 10 MG tablet TAKE 1 TABLET DAILY   valsartan (DIOVAN) 40 MG tablet Take 0.5 tablets (20 mg total) by mouth daily as needed (For a systolic over [id number]).   venlafaxine (EFFEXOR) 37.5 MG tablet Take 37.5 mg by mouth daily. Once a day at noon.  acetaminophen (TYLENOL) 500 MG tablet Take 1 tablet (500 mg total) by mouth every 6 (six) hours as needed for mild pain, fever or headache. (Patient not taking: Reported on 11/07/2021)   busPIRone (BUSPAR) 15 MG tablet TAKE ONE TABLET TWICE DAILY (Patient not taking: Reported on 02/14/2022)   furosemide (LASIX) 20 MG tablet Take 20 mg by mouth as needed. (Patient not taking: Reported on 02/14/2022)   meclizine (ANTIVERT) 12.5 MG tablet Take 1 tablet (12.5 mg total) by mouth 3 (three) times daily as needed for dizziness. (Patient not taking: Reported on 03/28/2022)   metoprolol succinate (TOPROL-XL) 25 MG 24 hr tablet Take 0.5 tablets (12.5 mg total) by mouth daily. Take with or immediately following  a meal. (Patient taking differently: Take 12.5 mg by mouth 2 (two) times daily. Take with or immediately following a meal.)   potassium chloride SA (KLOR-CON) 20 MEQ tablet Take 20 mEq by mouth 3 (three) times a week. (Patient not taking: Reported on 02/14/2022)   No current facility-administered medications for this visit. (Other)   REVIEW OF SYSTEMS: ROS   Positive for: Cardiovascular, Eyes Negative for: Constitutional, Gastrointestinal, Neurological, Skin, Genitourinary, Musculoskeletal, HENT, Endocrine, Respiratory, Psychiatric, Allergic/Imm, Heme/Lymph Last edited by Theodore Demark, COA on 03/28/2022  9:23 AM.     ALLERGIES Allergies  Allergen Reactions   Sotalol Other (See Comments)    Torsades    Tramadol Other (See Comments)    Hallucination, bad-dream   Actos [Pioglitazone] Swelling   Adhesive [Tape] Other (See Comments)    Causes sores   Atorvastatin Other (See Comments)    myalgia   Bactrim [Sulfamethoxazole-Trimethoprim] Itching and Rash    Itchy rash   Ciprofloxacin Nausea Only   Contrast Media [Iodinated Contrast Media] Other (See Comments)    Headache (no action/pre-med required)   Latex Rash   Pedi-Pre Tape Spray [Wound Dressing Adhesive] Other (See Comments)    Causes sores   Sulfa Antibiotics Other (See Comments)    Causes sores   PAST MEDICAL HISTORY Past Medical History:  Diagnosis Date   AICD (automatic cardioverter/defibrillator) present    Allergy    SESONAL   Aneurysm of infrarenal abdominal aorta (HCC)    Anxiety    ARTHRITIS    Arthritis    Atrial fibrillation (HCC)    CAD (coronary artery disease)    a. history of cardiac arrest 1993;  b. s/p LAD/LCX stenting in 2003;  c. 07/2011 Cath: LM nl, LAD patent stent, D1 80ost, LCX patent stent, RCA min irregs;  d. 04/2012 MV: EF 66%, no ishcemia;  e. 12/2013 Echo: Ef 55%, no rwma, Gr 1 DD, triv AI/MR, mildly dil LA;  f. 12/2013 Lexi MV: intermediate risk - apical ischemia and inf/infsept fixed  defect, ? artifactual.   CAD in native artery 12/31/2013   Previous stents to LAD and Ramus patent on cath today 12/31/13 also There is severe disease in the ostial first diagonal which is unchanged from most recent cardiac catheterization. The right coronary artery could not be engaged selectively but nonselective angiography showed no significant disease in the proximal and midsegment.       Cataract    DENIES   Chronic back pain    Chronic sinus bradycardia    CKD (chronic kidney disease), stage III (HCC)    Clotting disorder ([place])    DVT   COLITIS 12/02/2007   Qualifier: Diagnosis of  By: Nils Pyle CMA (AAMA), [name]     Diabetes mellitus without complication ([place])  DENIES   Diverticulosis of colon (without mention of hemorrhage) 2007   Colonoscopy    Esophageal stricture    a. 2012 s/p dil.   Esophagitis, unspecified    a. 2012 EGD   EXTERNAL HEMORRHOIDS    GERD (gastroesophageal reflux disease)    omeprazole   H/O hiatal hernia    Hiatal hernia    a. 2012 EGD.   History of DVT in the past, not on Coumadin now    left  leg   HYPERCHOLESTEROLEMIA    Hypertension    ICD (implantable cardiac defibrillator) in place    a. s/p initial ICD in 1993 in setting of cardiac arrest;  b. 01/2007 gen change: Guidant T135 Vitality DS VR single lead ICD.   Macular degeneration    gets injecton in eye every 5 weeks- last injection - 05/03/2013    Myocardial infarction [facility]) 1993   Near syncope 05/22/2015   Nephrolithiasis, just saw Dr [name]    Orthostatic hypotension    Peripheral vascular disease ([place])    ???   RECTAL BLEEDING 12/03/2007   Qualifier: Diagnosis of  By: [name]    Unspecified gastritis and gastroduodenitis without mention of hemorrhage    a. 2003 EGD->not noted on 2012 EGD.   Unstable angina, neg MI, cath stable.maybe GI 12/30/2013   Past Surgical History:  Procedure Laterality Date   BREAST BIOPSY Left 11/2013   CARDIAC CATHETERIZATION      CARDIAC CATHETERIZATION  01/01/15   patent stents -there is severe disease in ostial 1st diag but without change.    CARDIAC DEFIBRILLATOR PLACEMENT  02/05/2007   Guidant   CARDIOVASCULAR STRESS TEST  12/30/2013   abnormal   CARDIOVERSION N/A 02/13/2019   Procedure: CARDIOVERSION;  Surgeon: Sanda Klein, MD;  Location: [facility];  Service: Cardiovascular;  Laterality: N/A;   CATARACT EXTRACTION Right    Dr. Katy Fitch   COLONOSCOPY     COLONOSCOPY WITH PROPOFOL N/A 03/30/2020   Procedure: COLONOSCOPY WITH PROPOFOL;  Surgeon: Harvel Quale, MD;  Location: [facility];  Service: Gastroenterology;  Laterality: N/A;   coronary stents      CYSTOSCOPY WITH URETEROSCOPY Right 05/20/2013   Procedure: CYSTOSCOPY, RIGHT URETEROSCOPY STONE EXTRACTION, Insertion of right DOUBLE J STENT ;  Surgeon: Irine Seal, MD;  Location: [facility];  Service: Urology;  Laterality: Right;   CYSTOSCOPY WITH URETEROSCOPY AND STENT PLACEMENT N/A 06/03/2013   Procedure: SECOND LOOK CYSTOSCOPY WITH URETEROSCOPY  HOLMIUM LASER LITHO AND STONE EXTRACTION Sammie Bench ;  Surgeon: Malka So, MD;  Location: [facility];  Service: Urology;  Laterality: N/A;   ESOPHAGOGASTRODUODENOSCOPY (EGD) WITH PROPOFOL N/A 11/20/2019   Procedure: ESOPHAGOGASTRODUODENOSCOPY (EGD) WITH PROPOFOL;  Surgeon: Daneil Dolin, MD;  Location: [facility];  Service: Endoscopy;  Laterality: N/A;   GIVENS CAPSULE STUDY N/A 10/30/2016   Procedure: GIVENS CAPSULE STUDY;  Surgeon: Irene Shipper, MD;  Location[facility];  Service: Endoscopy;  Laterality: N/A;  NEEDS TO BE ADMITTED FOR OBSERVATION PT. HAS DEFIB   HEMORROIDECTOMY  80's   HOLMIUM LASER APPLICATION Right [id number]   Procedure: HOLMIUM LASER APPLICATION;  Surgeon: Irine Seal, MD;  Location: [facility];  Service: Urology;  Laterality: Right;   HYSTEROSCOPY WITH D & C  09/02/2010   Procedure: DILATATION AND CURETTAGE (D&C) /HYSTEROSCOPY;  Surgeon: Margarette Asal;  Location: [facility];  Service:  Gynecology;  Laterality: N/A;  Dilation and Curettage with Hysteroscopy and Polypectomy   IMPLANTABLE CARDIOVERTER  DEFIBRILLATOR (ICD) GENERATOR CHANGE N/A 02/10/2014   Procedure: ICD GENERATOR CHANGE;  Surgeon: Sanda Klein, MD;  Location: [facility] CATH LAB;  Service: Cardiovascular;  Laterality: N/A;   LEFT HEART CATHETERIZATION WITH CORONARY ANGIOGRAM N/A 07/28/2011   Procedure: LEFT HEART CATHETERIZATION WITH CORONARY ANGIOGRAM;  Surgeon: Lorretta Harp, MD;  Location: [facility] CATH LAB;  Service: Cardiovascular;  Laterality: N/A;   LEFT HEART CATHETERIZATION WITH CORONARY ANGIOGRAM N/A 12/31/2013   Procedure: LEFT HEART CATHETERIZATION WITH CORONARY ANGIOGRAM;  Surgeon: Wellington Hampshire, MD;  Location: [facility] CATH LAB;  Service: Cardiovascular;  Laterality: N/A;   FAMILY HISTORY Family History  Problem Relation Age of Onset   Stroke Mother    Other Mother        brain tumor   Other Father        MI   Heart attack Father    Stroke Sister    Macular degeneration Sister    Diabetes Daughter    Cancer Daughter        ovarian   Colon polyps Daughter    Atrial fibrillation Sister    Hyperlipidemia Sister    Osteoporosis Sister    Stroke Sister    Uterine cancer Sister    Heart attack Brother    Heart disease Brother    Asthma Brother    Breast cancer Neg Hx    SOCIAL HISTORY Social History   Tobacco Use   Smoking status: Never   Smokeless tobacco: Never  Vaping Use   Vaping Use: Never used  Substance Use Topics   Alcohol use: No   Drug use: No       OPHTHALMIC EXAM:  Base Eye Exam     Visual Acuity (Snellen - Linear)       Right Left   Dist [place] 20/100 -2 20/150 -1   Dist ph   20/100 -1         Tonometry (Tonopen, 9:20 AM)       Right Left   Pressure 12 11         Pupils       Dark Light Shape React APD   Right 3 2 Round Minimal None   Left 3 2 Round Minimal None         Visual Fields (Counting fingers)       Left Right    Full Full          Extraocular Movement       Right Left    Full, Ortho Full, Ortho         Neuro/Psych     Oriented x3: Yes   Mood/Affect: Normal         Dilation     Both eyes: 1.0% Mydriacyl, 2.5% Phenylephrine @ 9:20 AM           Slit Lamp and Fundus Exam     Slit Lamp Exam       Right Left   Lids/Lashes Dermatochalasis - upper lid Dermatochalasis - upper lid   Conjunctiva/Sclera White and quiet White and quiet   Cornea 1-2+ Punctate epithelial erosions, well healed cataract wound 2+ fine Punctate epithelial erosions, well healed cataract wound   Anterior Chamber deep and clear deep and clear   Iris Round and dilated Round and dilated   Lens PC IOL in good position PC IOL in good position   Anterior Vitreous mild syneresis, Posterior vitreous detachment mild syneresis, silicone oil microbubbles, PVD  Fundus Exam       Right Left   Disc mild Pallor, Sharp rim, temporal PPA mild Pallor, Sharp rim, temporal PPA   C/D Ratio 0.1 0.1   Macula Blunted foveal reflex, central GA with punctate heme superior macula--improved, Drusen, RPE mottling and clumping, +CNV Blunted foveal reflex, large area of GA centrally, Drusen, RPE mottling, clumping and atrophy, no heme   Vessels attenuated, Tortuous attenuated, mild tortuosity   Periphery Attached, reticular degeneration, No heme Attached, reticular degeneration, No heme           IMAGING AND PROCEDURES  Imaging and Procedures for 03/28/2022  OCT, Retina - OU - Both Eyes       Right Eye Quality was good. Central Foveal Thickness: 181. Progression has been stable. Findings include normal foveal contour, no IRF, no SRF, retinal drusen , subretinal hyper-reflective material, pigment epithelial detachment, outer retinal atrophy (Central ORA with scattered PED / SRHM--stable).   Left Eye Quality was good. Central Foveal Thickness: 282. Progression has been stable. Findings include normal foveal contour, no IRF, no SRF, retinal  drusen , subretinal hyper-reflective material, outer retinal atrophy (Central ORA with [facility]).   Notes *Images captured and stored on drive  Diagnosis / Impression:  OD: Exudative ARMD - Central ORA with scattered PED / SRHM--stable OS: Nonexudative ARMD - Central ORA with [facility]  Clinical management:  See below  Abbreviations: NFP - Normal foveal profile. CME - cystoid macular edema. PED - pigment epithelial detachment. IRF - intraretinal fluid. SRF - subretinal fluid. EZ - ellipsoid zone. ERM - epiretinal membrane. ORA - outer retinal atrophy. ORT - outer retinal tubulation. SRHM - subretinal hyper-reflective material. IRHM - intraretinal hyper-reflective material      Intravitreal Injection, Pharmacologic Agent - OD - Right Eye       Time Out 03/28/2022. 9:52 AM. Confirmed correct patient, procedure, site, and patient consented.   Anesthesia Topical anesthesia was used. Anesthetic medications included Lidocaine 2%, Proparacaine 0.5%.   Procedure Preparation included 5% betadine to ocular surface, eyelid speculum. A supplied (32g) needle was used.   Injection: 1.25 mg Bevacizumab 1.'25mg'$ /0.65m   Route: Intravitreal, Site: Right Eye   NDC: [id number] Expiration date: 04/30/2022   Post-op Post injection exam found visual acuity of at least counting fingers. The patient tolerated the procedure well. There were no complications. The patient received written and verbal post procedure care education. Post injection medications were not given.            ASSESSMENT/PLAN:    ICD-10-CM   1. Exudative age-related macular degeneration of right eye with active choroidal neovascularization (HCC)  H35.3211 OCT, Retina - OU - Both Eyes    Intravitreal Injection, Pharmacologic Agent - OD - Right Eye    Bevacizumab (AVASTIN) SOLN 1.25 mg    2. Advanced atrophic nonexudative age-related macular degeneration of left eye with subfoveal involvement  H35.3124     3.  Essential hypertension  I10     4. Hypertensive retinopathy of both eyes  H35.033     5. Pseudophakia, both eyes  Z96.1      Exudative age related macular degeneration, right eye - previously managed by Dr. RZadie Rhine- last visit and injection was 09.07.24 (IVA OD) -- pt was on a q6-8 wk maintenance schedule -- presented at 4+ mos since last IVA OD w/ [facility] - s/p IVA OD #1 (01.30.24)  - BCVA 20/100 OD improved from 20/150  - OCT OD: Central ORA with scattered PED /  SRHM at 6 wks -- stable  - exam shows punctate IRH centrally - recommend IVA OD #2 today, 03.12.24 for punctate IRH and maintenance tx w/ f/u in 8 wks  - pt wishes to proceed with injection  - RBA of procedure discussed, questions answered - IVA informed consent obtained and signed 01.30.24 - see procedure note  - f/u in 8 wks, DFE, OCT, possible injxn  2. Age related macular degeneration, non-exudative, left eye  - advanced stage with large central area of GA  - BCVA 20/100 OS stable - The incidence, anatomy, and pathology of dry AMD, risk of progression, and the AREDS and AREDS 2 study including smoking risks discussed with patient.  - Recommend amsler grid monitoring  3,4. Hypertensive retinopathy OU - discussed importance of tight BP control - continue to monitor  5. Pseudophakia OU  - s/p CE/IOL OU (Dr. Shirleen Schirmer)  - IOLs in good position, doing well  - continue to monitor  Ophthalmic Meds Ordered this visit:  Meds ordered this encounter  Medications   Bevacizumab (AVASTIN) SOLN 1.25 mg     Return in about 8 weeks (around 05/23/2022) for f/u Ex. AMD OD , DFE, OCT, Possible, IVA, OD.  There are no Patient Instructions on file for this visit.   Explained the diagnoses, plan, and follow up with the patient and they expressed understanding.  Patient expressed understanding of the importance of proper follow up care.   This document serves as a record of services personally performed by Gardiner Sleeper, MD, PhD.  It was created on their behalf by Roselee Nova, COMT. The creation of this record is the provider's dictation and/or activities during the visit.  Electronically signed by: Roselee Nova, COMT 03/28/22 1:09 PM  Gardiner Sleeper, M.D., Ph.D. Diseases & Surgery of the Retina and Vitreous [facility]  I have reviewed the above documentation for accuracy and completeness, and I agree with the above. Gardiner Sleeper, M.D., Ph.D. 03/28/22 1:10 PM   Abbreviations: M myopia (nearsighted); A astigmatism; H hyperopia (farsighted); P presbyopia; Mrx spectacle prescription;  CTL contact lenses; OD right eye; OS left eye; OU both eyes  XT exotropia; ET esotropia; PEK punctate epithelial keratitis; PEE punctate epithelial erosions; DES dry eye syndrome; MGD meibomian gland dysfunction; ATs artificial tears; PFAT's preservative free artificial tears; [place] nuclear sclerotic cataract; PSC posterior subcapsular cataract; ERM epi-retinal membrane; PVD posterior vitreous detachment; RD retinal detachment; DM diabetes mellitus; DR diabetic retinopathy; NPDR non-proliferative diabetic retinopathy; PDR proliferative diabetic retinopathy; CSME clinically significant macular edema; DME diabetic macular edema; dbh dot blot hemorrhages; CWS cotton wool spot; POAG primary open angle glaucoma; C/D cup-to-disc ratio; HVF humphrey visual field; GVF goldmann visual field; OCT optical coherence tomography; IOP intraocular pressure; BRVO Branch retinal vein occlusion; CRVO central retinal vein occlusion; CRAO central retinal artery occlusion; BRAO branch retinal artery occlusion; RT retinal tear; SB scleral buckle; PPV pars plana vitrectomy; VH Vitreous hemorrhage; PRP panretinal laser photocoagulation; IVK intravitreal kenalog; VMT vitreomacular traction; MH Macular hole;  NVD neovascularization of the disc; NVE neovascularization elsewhere; AREDS age related eye disease study; ARMD age related macular degeneration;  POAG primary open angle glaucoma; EBMD epithelial/anterior basement membrane dystrophy; ACIOL anterior chamber intraocular lens; IOL intraocular lens; PCIOL posterior chamber intraocular lens; Phaco/IOL phacoemulsification with intraocular lens placement; [place] photorefractive keratectomy; LASIK laser assisted in situ keratomileusis; HTN hypertension; DM diabetes mellitus; COPD chronic obstructive pulmonary disease

## 2022-03-28 ENCOUNTER — Encounter (INDEPENDENT_AMBULATORY_CARE_PROVIDER_SITE_OTHER): Payer: Self-pay | Admitting: Ophthalmology

## 2022-03-28 ENCOUNTER — Ambulatory Visit (INDEPENDENT_AMBULATORY_CARE_PROVIDER_SITE_OTHER): Payer: Medicare (Managed Care) | Admitting: Ophthalmology

## 2022-03-28 DIAGNOSIS — I10 Essential (primary) hypertension: Secondary | ICD-10-CM | POA: Diagnosis not present

## 2022-03-28 DIAGNOSIS — H353211 Exudative age-related macular degeneration, right eye, with active choroidal neovascularization: Secondary | ICD-10-CM

## 2022-03-28 DIAGNOSIS — H353124 Nonexudative age-related macular degeneration, left eye, advanced atrophic with subfoveal involvement: Secondary | ICD-10-CM

## 2022-03-28 DIAGNOSIS — H35033 Hypertensive retinopathy, bilateral: Secondary | ICD-10-CM | POA: Diagnosis not present

## 2022-03-28 DIAGNOSIS — Z961 Presence of intraocular lens: Secondary | ICD-10-CM

## 2022-03-28 MED ORDER — BEVACIZUMAB CHEMO INJECTION 1.25MG/0.05ML SYRINGE FOR KALEIDOSCOPE
1.2500 mg | INTRAVITREAL | Status: AC | PRN
  Administered 2022-03-28: 1.25 mg via INTRAVITREAL

## 2022-04-21 ENCOUNTER — Encounter: Payer: Self-pay | Admitting: Podiatry

## 2022-04-21 ENCOUNTER — Ambulatory Visit (HOSPITAL_COMMUNITY)
Admission: RE | Admit: 2022-04-21 | Discharge: 2022-04-21 | Disposition: A | Discharge status: Home / Self Care | Payer: Medicare (Managed Care) | Source: Ambulatory Visit | Attending: Cardiovascular Disease | Admitting: Cardiovascular Disease

## 2022-04-21 ENCOUNTER — Ambulatory Visit (INDEPENDENT_AMBULATORY_CARE_PROVIDER_SITE_OTHER): Payer: Medicare (Managed Care)

## 2022-04-21 ENCOUNTER — Ambulatory Visit (INDEPENDENT_AMBULATORY_CARE_PROVIDER_SITE_OTHER): Payer: Medicare (Managed Care) | Admitting: Podiatry

## 2022-04-21 ENCOUNTER — Telehealth: Payer: Self-pay | Admitting: *Deleted

## 2022-04-21 DIAGNOSIS — B351 Tinea unguium: Secondary | ICD-10-CM

## 2022-04-21 DIAGNOSIS — S9030XA Contusion of unspecified foot, initial encounter: Secondary | ICD-10-CM | POA: Diagnosis not present

## 2022-04-21 DIAGNOSIS — M79604 Pain in right leg: Secondary | ICD-10-CM

## 2022-04-21 DIAGNOSIS — Z7901 Long term (current) use of anticoagulants: Secondary | ICD-10-CM | POA: Diagnosis not present

## 2022-04-21 DIAGNOSIS — M79672 Pain in left foot: Secondary | ICD-10-CM

## 2022-04-21 DIAGNOSIS — S9031XA Contusion of right foot, initial encounter: Secondary | ICD-10-CM | POA: Diagnosis not present

## 2022-04-21 DIAGNOSIS — L84 Corns and callosities: Secondary | ICD-10-CM | POA: Diagnosis not present

## 2022-04-21 DIAGNOSIS — M79675 Pain in left toe(s): Secondary | ICD-10-CM | POA: Diagnosis not present

## 2022-04-21 DIAGNOSIS — M79674 Pain in right toe(s): Secondary | ICD-10-CM | POA: Diagnosis not present

## 2022-04-21 NOTE — Telephone Encounter (Signed)
Patient has been scheduled for a 12:30 appointment([facility] location-[facility]), arrival time 12:00,patient has been updated.

## 2022-04-21 NOTE — Progress Notes (Unsigned)
Subjective:   Patient ID: [name], female   DOB: 89 y.o.   MRN: [id number]   HPI Chief Complaint  Patient presents with   Nail Problem    NP  thick painful toenails, pre-ulcerative callus left great toe, just getting over cellulitis, finished antibiotics - patient takes Eliquis   bruise    Right foot has significant bruising. Patient claims that she did not injure her foot in anyway. She does report having cramping and pain in the left calf a few days ago. H/o DVT left   89 year old female presents the office for above concerns.  She presents with thick, elongated nails as well as callus on the left big toe which are causing discomfort.  She recently had cellulitis and she is finished course of antibiotics.  She is also on Eliquis.  She also has a significant bruising present on the right foot.  She does not recall any injuries.  She is able to walk on it without any pain.  No treatment.   Review of Systems  All other systems reviewed and are negative.   Past Medical History:  Diagnosis Date   AICD (automatic cardioverter/defibrillator) present    Allergy    SESONAL   Aneurysm of infrarenal abdominal aorta    Anxiety    ARTHRITIS    Arthritis    Atrial fibrillation    CAD (coronary artery disease)    a. history of cardiac arrest 1993;  b. s/p LAD/LCX stenting in 2003;  c. 07/2011 Cath: LM nl, LAD patent stent, D1 80ost, LCX patent stent, RCA min irregs;  d. 04/2012 MV: EF 66%, no ishcemia;  e. 12/2013 Echo: Ef 55%, no rwma, Gr 1 DD, triv AI/MR, mildly dil LA;  f. 12/2013 Lexi MV: intermediate risk - apical ischemia and inf/infsept fixed defect, ? artifactual.   CAD in native artery 12/31/2013   Previous stents to LAD and Ramus patent on cath today 12/31/13 also There is severe disease in the ostial first diagonal which is unchanged from most recent cardiac catheterization. The right coronary artery could not be engaged selectively but nonselective angiography showed no  significant disease in the proximal and midsegment.       Cataract    DENIES   Chronic back pain    Chronic sinus bradycardia    CKD (chronic kidney disease), stage III    Clotting disorder    DVT   COLITIS 12/02/2007   Qualifier: Diagnosis of  By: Koleen Distance CMA Duncan Dull), Hulan Saas     Diabetes mellitus without complication    DENIES   Diverticulosis of colon (without mention of hemorrhage) 2007   Colonoscopy    Esophageal stricture    a. 2012 s/p dil.   Esophagitis, unspecified    a. 2012 EGD   EXTERNAL HEMORRHOIDS    GERD (gastroesophageal reflux disease)    omeprazole   H/O hiatal hernia    Hiatal hernia    a. 2012 EGD.   History of DVT in the past, not on Coumadin now    left  leg   HYPERCHOLESTEROLEMIA    Hypertension    ICD (implantable cardiac defibrillator) in place    a. s/p initial ICD in 1993 in setting of cardiac arrest;  b. 01/2007 gen change: Guidant T135 Vitality DS VR single lead ICD.   Macular degeneration    gets injecton in eye every 5 weeks- last injection - 05/03/2013    Myocardial infarction 1993   Near syncope 05/22/2015   Nephrolithiasis,  just saw Dr Annabell HowellsWrenn- "OK"    Orthostatic hypotension    Peripheral vascular disease    ???   RECTAL BLEEDING 12/03/2007   Qualifier: Diagnosis of  By: Jarold MottoPatterson MD Lang SnowFACG, David R    Unspecified gastritis and gastroduodenitis without mention of hemorrhage    a. 2003 EGD->not noted on 2012 EGD.   Unstable angina, neg MI, cath stable.maybe GI 12/30/2013    Past Surgical History:  Procedure Laterality Date   BREAST BIOPSY Left 11/2013   CARDIAC CATHETERIZATION     CARDIAC CATHETERIZATION  01/01/15   patent stents -there is severe disease in ostial 1st diag but without change.    CARDIAC DEFIBRILLATOR PLACEMENT  02/05/2007   Guidant   CARDIOVASCULAR STRESS TEST  12/30/2013   abnormal   CARDIOVERSION N/A 02/13/2019   Procedure: CARDIOVERSION;  Surgeon: Thurmon Fairroitoru, Mihai, MD;  Location: [facility];  Service: Cardiovascular;   Laterality: N/A;   CATARACT EXTRACTION Right    Dr. Dione BoozeGroat   COLONOSCOPY     COLONOSCOPY WITH PROPOFOL N/A 03/30/2020   Procedure: COLONOSCOPY WITH PROPOFOL;  Surgeon: Dolores Frameastaneda Mayorga, Daniel, MD;  Location: [facility];  Service: Gastroenterology;  Laterality: N/A;   coronary stents      CYSTOSCOPY WITH URETEROSCOPY Right 05/20/2013   Procedure: CYSTOSCOPY, RIGHT URETEROSCOPY STONE EXTRACTION, Insertion of right DOUBLE J STENT ;  Surgeon: Bjorn PippinJohn Wrenn, MD;  Location: [facility];  Service: Urology;  Laterality: Right;   CYSTOSCOPY WITH URETEROSCOPY AND STENT PLACEMENT N/A 06/03/2013   Procedure: SECOND LOOK CYSTOSCOPY WITH URETEROSCOPY  HOLMIUM LASER LITHO AND STONE EXTRACTION Newman NipJJ STENT ;  Surgeon: Anner CreteJohn J Wrenn, MD;  Location: [facility];  Service: Urology;  Laterality: N/A;   ESOPHAGOGASTRODUODENOSCOPY (EGD) WITH PROPOFOL N/A 11/20/2019   Procedure: ESOPHAGOGASTRODUODENOSCOPY (EGD) WITH PROPOFOL;  Surgeon: Corbin Adeourk, Robert M, MD;  Location: [facility];  Service: Endoscopy;  Laterality: N/A;   GIVENS CAPSULE STUDY N/A 10/30/2016   Procedure: GIVENS CAPSULE STUDY;  Surgeon: Hilarie FredricksonPerry, John N, MD;  [facility];  Service: Endoscopy;  Laterality: N/A;  NEEDS TO BE ADMITTED FOR OBSERVATION PT. HAS DEFIB   HEMORROIDECTOMY  80's   HOLMIUM LASER APPLICATION Right 05/20/2013   Procedure: HOLMIUM LASER APPLICATION;  Surgeon: Bjorn PippinJohn Wrenn, MD;  Location: [facility];  Service: Urology;  Laterality: Right;   HYSTEROSCOPY WITH D & C  09/02/2010   Procedure: DILATATION AND CURETTAGE (D&C) /HYSTEROSCOPY;  Surgeon: Meriel Picaichard M Holland;  Location: [facility];  Service: Gynecology;  Laterality: N/A;  Dilation and Curettage with Hysteroscopy and Polypectomy   IMPLANTABLE CARDIOVERTER DEFIBRILLATOR (ICD) GENERATOR CHANGE N/A 02/10/2014   Procedure: ICD GENERATOR CHANGE;  Surgeon: Thurmon FairMihai Croitoru, MD;  Location: [facility];  Service: Cardiovascular;  Laterality: N/A;   LEFT HEART CATHETERIZATION WITH CORONARY ANGIOGRAM N/A 07/28/2011    Procedure: LEFT HEART CATHETERIZATION WITH CORONARY ANGIOGRAM;  Surgeon: Runell GessJonathan J Berry, MD;  [facility];  Service: Cardiovascular;  Laterality: N/A;   LEFT HEART CATHETERIZATION WITH CORONARY ANGIOGRAM N/A 12/31/2013   Procedure: LEFT HEART CATHETERIZATION WITH CORONARY ANGIOGRAM;  Surgeon: Iran OuchMuhammad A Arida, MD;  Location: [facility];  Service: Cardiovascular;  Laterality: N/A;     Current Outpatient Medications:    acetaminophen (TYLENOL) 500 MG tablet, Take 1 tablet (500 mg total) by mouth every 6 (six) hours as needed for mild pain, fever or headache. (Patient not taking: Reported on 11/07/2021), Disp: 30 tablet, Rfl: 0   Alpha-Lipoic Acid 600 MG TABS, Take 600 mg by mouth daily. For neuropathy  in legs, Disp: 90 tablet, Rfl: 3   amiodarone (PACERONE) 200 MG tablet, Take 1 tablet (200 mg total) by mouth daily., Disp: 90 tablet, Rfl: 3   amLODipine (NORVASC) 2.5 MG tablet, Take 1 tablet (2.5 mg total) by mouth daily. Do NOT take if BP drops below 130 on top or below 60 on bottom., Disp: 90 tablet, Rfl: 3   aspirin EC 81 MG EC tablet, Take 1 tablet (81 mg total) by mouth daily. Swallow whole., Disp: 30 tablet, Rfl: 11   busPIRone (BUSPAR) 15 MG tablet, TAKE ONE TABLET TWICE DAILY (Patient not taking: Reported on 02/14/2022), Disp: 180 tablet, Rfl: 0   carboxymethylcellulose (REFRESH PLUS) 0.5 % SOLN, Place 1 drop into both eyes 3 (three) times daily as needed (dry/irritated eyes.). (Patient not taking: Reported on 11/07/2021), Disp: , Rfl:    Cholecalciferol (VITAMIN D) 50 MCG (2000 UT) CAPS, Take 2,000 Units by mouth every morning., Disp: , Rfl:    ELIQUIS 2.5 MG TABS tablet, TAKE (1) TABLET TWICE A DAY., Disp: 180 tablet, Rfl: 1   feeding supplement (ENSURE ENLIVE / ENSURE PLUS) LIQD, Take 237 mLs by mouth 2 (two) times daily between meals., Disp: , Rfl:    furosemide (LASIX) 20 MG tablet, Take 20 mg by mouth as needed. (Patient not taking: Reported on 02/14/2022), Disp: , Rfl:     gabapentin (NEURONTIN) 100 MG capsule, TAKE UP TO 3 CAPSULES AT BEDTIME AS NEEDED, Disp: 90 capsule, Rfl: 1   isosorbide mononitrate (IMDUR) 60 MG 24 hr tablet, Take 1 tablet (60 mg total) by mouth daily., Disp: 30 tablet, Rfl: 11   latanoprost (XALATAN) 0.005 % ophthalmic solution, Place 1 drop into the left eye at bedtime., Disp: , Rfl:    levothyroxine (SYNTHROID) 100 MCG tablet, TAKE ONE TABLET BY MOUTH EVERY MORNING, Disp: 90 tablet, Rfl: 1   meclizine (ANTIVERT) 12.5 MG tablet, Take 1 tablet (12.5 mg total) by mouth 3 (three) times daily as needed for dizziness. (Patient not taking: Reported on 03/28/2022), Disp: 30 tablet, Rfl: 0   MELATONIN PO, Take 3 mg by mouth at bedtime., Disp: , Rfl:    metoprolol succinate (TOPROL-XL) 25 MG 24 hr tablet, Take 0.5 tablets (12.5 mg total) by mouth daily. Take with or immediately following a meal. (Patient taking differently: Take 12.5 mg by mouth 2 (two) times daily. Take with or immediately following a meal.), Disp: 15 tablet, Rfl: 11   metoprolol tartrate (LOPRESSOR) 25 MG tablet, Take 25 mg by mouth daily. Once daily, Disp: , Rfl:    Multiple Vitamins-Minerals (CENTRUM SILVER ULTRA WOMENS PO), Take 1 tablet by mouth every morning., Disp: , Rfl:    nitroGLYCERIN (NITROSTAT) 0.4 MG SL tablet, PLACE 1 TABLET UNDER THE TONGUE AT ONSET OF CHEST PAIN EVERY 5 MINTUES UP TO 3 TIMES AS NEEDED, Disp: 25 tablet, Rfl: 2   pantoprazole (PROTONIX) 40 MG tablet, TAKE 1 TABLET ONCE DAILY, Disp: 90 tablet, Rfl: 0   potassium chloride SA (KLOR-CON) 20 MEQ tablet, Take 20 mEq by mouth 3 (three) times a week. (Patient not taking: Reported on 02/14/2022), Disp: , Rfl:    rosuvastatin (CRESTOR) 10 MG tablet, TAKE 1 TABLET DAILY, Disp: 90 tablet, Rfl: 0   valsartan (DIOVAN) 40 MG tablet, Take 0.5 tablets (20 mg total) by mouth daily as needed (For a systolic over 180)., Disp: 15 tablet, Rfl: 11   venlafaxine (EFFEXOR) 37.5 MG tablet, Take 37.5 mg by mouth daily. Once a day at  noon., Disp: ,  Rfl:   Allergies  Allergen Reactions   Sotalol Other (See Comments)    Torsades    Tramadol Other (See Comments)    Hallucination, bad-dream   Actos [Pioglitazone] Swelling   Adhesive [Tape] Other (See Comments)    Causes sores   Atorvastatin Other (See Comments)    myalgia   Bactrim [Sulfamethoxazole-Trimethoprim] Itching and Rash    Itchy rash   Ciprofloxacin Nausea Only   Contrast Media [Iodinated Contrast Media] Other (See Comments)    Headache (no action/pre-med required)   Latex Rash   Pedi-Pre Tape Spray [Wound Dressing Adhesive] Other (See Comments)    Causes sores   Sulfa Antibiotics Other (See Comments)    Causes sores          Objective:  Physical Exam  General: AAO x3, NAD  Dermatological: General: AAO x3, NAD  Dermatological: Skin is warm, dry and supple bilateral. Nails x 10 are well manicured; remaining integument appears unremarkable at this time.  At the distal aspect left hallux thick hyperkeratotic tissue with dried blood which is preulcerative.  Upon debridement there is no definitive skin breakdown however there is dried blood present.  There is no edema, erythema, ascending cellulitis.  There is no fluctuance or crepitation.  There is no malodor.  Vascular: Dorsalis Pedis artery and Posterior Tibial artery pedal pulses are palpable bilateral with immedate capillary fill time.  There is no pain with calf compression, swelling, warmth, erythema.   Neruologic: Grossly intact via light touch bilateral.   Musculoskeletal: Bruising present on the right foot.  Not able to see any significant area of pinpoint tenderness.  There is edema present there is no erythema or warmth.  Gait: Unassisted, Nonantalgic.    Vascular: Dorsalis Pedis artery and Posterior Tibial artery pedal pulses are 2/4 bilateral with immedate capillary fill time. Pedal hair growth present. No varicosities and no lower extremity edema present bilateral. There is no pain  with calf compression, swelling, warmth, erythema.   Neruologic: Grossly intact via light touch bilateral. Vibratory intact via tuning fork bilateral. Protective threshold with Semmes Wienstein monofilament intact to all pedal sites bilateral. Patellar and Achilles deep tendon reflexes 2+ bilateral. No Babinski or clonus noted bilateral.   Musculoskeletal: No gross boney pedal deformities bilateral. No pain, crepitus, or limitation noted with foot and ankle range of motion bilateral. Muscular strength 5/5 in all groups tested bilateral.  Gait: Walking with a walker      Assessment:   Right foot bruising, contusion; symptomatic onychomycosis; preulcerative callus left foot     Plan:  -Treatment options discussed including all alternatives, risks, and complications -Etiology of symptoms were discussed -X-rays were obtained and reviewed with the patient.  3 views of the foot were obtained.  Not able to appreciate any evidence of acute fracture.  Acute changes present in the midfoot on the right side.  Significant hallux malleus is present on the left side.  On the left there is no cortical changes to suggest osteomyelitis at this time. -Sharply debrided nails x 10 without any complications or bleeding -Sharply debrided hyperkeratotic lesion x 1 without any complications or bleeding -Regards to the right foot she is able to walk on it.  Will order duplex to DVT as she does have some's mild swelling.  There is no signs of infection.  She has a surgical shoe at home that she is unaware.  Encouraged elevation.  Vivi Barrack DPM

## 2022-04-21 NOTE — Telephone Encounter (Signed)
[name] with [facility] calling for results, negative for DVT and baker's cyst, in right leg, have released the patient to go and report  will be ready to view in epic shortly.

## 2022-05-04 ENCOUNTER — Encounter: Payer: Self-pay | Admitting: Gastroenterology

## 2022-05-04 ENCOUNTER — Ambulatory Visit (INDEPENDENT_AMBULATORY_CARE_PROVIDER_SITE_OTHER): Payer: Medicare (Managed Care) | Admitting: Gastroenterology

## 2022-05-04 VITALS — BP 136/52 | HR 45 | Temp 97.6°F | Ht 63.0 in | Wt 121.6 lb

## 2022-05-04 DIAGNOSIS — R109 Unspecified abdominal pain: Secondary | ICD-10-CM | POA: Diagnosis not present

## 2022-05-04 DIAGNOSIS — R630 Anorexia: Secondary | ICD-10-CM

## 2022-05-04 DIAGNOSIS — K219 Gastro-esophageal reflux disease without esophagitis: Secondary | ICD-10-CM

## 2022-05-04 DIAGNOSIS — R634 Abnormal weight loss: Secondary | ICD-10-CM

## 2022-05-04 NOTE — Progress Notes (Signed)
GI Office Note    Referring Provider: Jethro Bastos, MD Primary Care Physician:  Jethro Bastos, MD Primary Gastroenterologist: Hennie Duos. Marletta Lor, DO  Date:  05/04/2022  ID:  [name], DOB 14-Nov-1931, MRN [id number]   Chief Complaint   Chief Complaint  Patient presents with   Follow-up    Follow up on abd pain   History of Present Illness  [name] is a 89 y.o. female with a history of GERD, C. difficile in January 2022, microscopic colitis on pathology in 2007, 2009, 2012, chronic pain.  She has had extensive evaluation of abdominal pain with EGD in November 2021 with mild Schatzki's ring not manipulated, CTA November 2021 without evidence of chronic mesenteric ischemia, abdominal ultrasound without gallstones in March 2022, HIDA scan also unrevealing at that time, colonoscopy in March 2022 with diverticulosis in the sigmoid and descending colon, 6 mm polyp in the sigmoid colon not removed (on Eliquis), repeat CTA March 2022 without evidence of large vessel disease, unable to tolerate BuSpar due to hallucinations, not a candidate for TCAs in the past due to prolonged QTc, had improvement with Imdur daily.  She has had ACTH stimulation test that was within normal limits.  She was suspected of having microvascular angina versus bowel hypersensitivity.  Evidence of pancreatic lesion on imaging dating back to 2020 thought to be a cyst versus IPMN versus fat without dedicated pancreatic imaging.  She was recently admitted to the hospital in January 2023 with a suspected stroke.  She is presenting today for follow up of abdominal pain.   Last seen in our office 04/07/2021 -she was seen for diarrhea and continued postprandial abdominal pain.  Her weight has been fluctuating.  She was trying to drink to boost shakes a day.  She reported that her abdominal pain was somewhat improved after having a bowel movement.  She was continuing to have watery stools 3-4 times a day with a  couple of nocturnal stools.  She was taking Pepto-Bismol as needed.  Denying any BRBPR or melena.   In the interim she had been evaluated in the ED in early April 2023 for chest pain, hypertension, and bradycardia.  She reported BP elevated to systolic over 200 at home with chest pain.  She took 2 nitroglycerin tablets which helped relieve her chest pain.  She also noted her heart rate to be 40.  Upon monitoring in the ED her heart rate improved to 60, BP was stabilized with no more chest pain.  She was recommended follow-up with her PCP and cardiology as needed   She was seen by her PCP on 05/11/2021 with complaint of rectal bleeding for 6 days.  Was wearing an incontinence pad and had bleeding on the pad.  Her sister had reported blood on her pajamas.  Also has noted some blood on toilet tissue with urination and defecation.  Also noted to be dizzy and lightheaded.  She underwent rectal exam without noted fissure or hemorrhoid.  Also FOBT negative in the office.  POCT hemoglobin was 11.3.(Of note the dizziness and weakness was reported to be baseline for the patient).  Pelvic exam without lesions or bleeding, UA positive for RBCs and a few bacteria.  She was given Keflex.   Last office visit 05/19/21. Reported rectal bleeding, abdominal pain, diarrhea. Baseline weakness. Mild rectal discomfort. Aide reported normal colored stools and has had chornic diarrhea. No N/V, weight loss. Follows with cardiology and nephrology. Advised CBC in 1 week,  use hemorrhoid cream as needed for rectal discomfort.  Advised to consider colonoscopy if rectal bleeding continues to worsen or significant anemia found on lab work.  Hgb 11 on 05/24/21.   Patient was hospitalized in November 17, 2021 with symptoms associated with COVID and presented with symptoms concerning for stroke.  Her stroke workup was negative and her mental status returned to baseline quickly.  She was noted to be chronically in A-fib with amiodarone, metoprolol,  Eliquis for anticoagulation.  CT A/P without contrast 12/05/2021: -Acute or active cardiopulmonary disease -Cholelithiasis -Small hiatal hernia -Compression fracture deformities level of T12 and L1 -Stable from millimeter subpleural posterior right basilar lung nodule  Last lab work 12/12/2021.  Hemoglobin of 10.3  Patient called PCP on 04/24/2022 with reports of heart rate in the 40s/50s.  She also reported persistent postprandial abdominal pain but unable to fully describe the pain does not completely go away.  She is advised to eat small frequent meals, stay upright for few hours after eating meals, avoid caffeine and other irritants continue Protonix once daily and add simethicone.  Today: Patient reports she feel like she has cramping and knotting of her abdominal pain that lasts all day. She reports at home she has gotten down to 117 and she was 121 here. Does not have a good appetite. This morning she had grits and a boiled egg. States yesterday she had sausage egg and toast. Usually eats a sand which or a half sand which at lunch. Some days she eats lunch and some she wont. She reports her pain is about the same even if she is eating. When she lays down it feels better. Denies any melena or brbpr. No dysphagia.   Patient reports she feels like she is pregnant and feels the pressure mid abdomen. She takes tylenol at home and does not feel like that helps. She does report the simethicone does help some but does not take it often.   As long as she is laying down straight it does not hurt as much.  States she is going to see cardiology soon regarding her afib.  She reports she had COVID and she had a fall in November.   No issues with hemorrhoids or constipation. Has some intermittent diarrhea.   She feels okay with laying in the recliner. When she gets up to do anything she has pain. She said that at times she feels a flutter in her abdomen. Not described as sharp pain.   Reports  pacemaker set on 50s.   Reports some recent blood work with PCP.    Wt Readings from Last 3 Encounters:  05/04/22 121 lb 9.6 oz (55.2 kg)  11/07/21 130 lb (59 kg)  07/11/21 127 lb 2 oz (57.7 kg)    Current Outpatient Medications  Medication Sig Dispense Refill   acetaminophen (TYLENOL) 500 MG tablet Take 1 tablet (500 mg total) by mouth every 6 (six) hours as needed for mild pain, fever or headache. 30 tablet 0   Alpha-Lipoic Acid 600 MG TABS Take 600 mg by mouth daily. For neuropathy in legs 90 tablet 3   amiodarone (PACERONE) 200 MG tablet Take 1 tablet (200 mg total) by mouth daily. 90 tablet 3   amLODipine (NORVASC) 2.5 MG tablet Take 1 tablet (2.5 mg total) by mouth daily. Do NOT take if BP drops below 130 on top or below 60 on bottom. 90 tablet 3   aspirin EC 81 MG EC tablet Take 1 tablet (81 mg total)  by mouth daily. Swallow whole. 30 tablet 11   carboxymethylcellulose (REFRESH PLUS) 0.5 % SOLN Place 1 drop into both eyes 3 (three) times daily as needed (dry/irritated eyes.).     Cholecalciferol (VITAMIN D) 50 MCG (2000 UT) CAPS Take 2,000 Units by mouth every morning.     ELIQUIS 2.5 MG TABS tablet TAKE (1) TABLET TWICE A DAY. 180 tablet 1   feeding supplement (ENSURE ENLIVE / ENSURE PLUS) LIQD Take 237 mLs by mouth 2 (two) times daily between meals.     gabapentin (NEURONTIN) 100 MG capsule TAKE UP TO 3 CAPSULES AT BEDTIME AS NEEDED 90 capsule 1   isosorbide mononitrate (IMDUR) 60 MG 24 hr tablet Take 1 tablet (60 mg total) by mouth daily. 30 tablet 11   latanoprost (XALATAN) 0.005 % ophthalmic solution Place 1 drop into the left eye at bedtime.     levothyroxine (SYNTHROID) 100 MCG tablet TAKE ONE TABLET BY MOUTH EVERY MORNING 90 tablet 1   MELATONIN PO Take 3 mg by mouth at bedtime.     metoprolol tartrate (LOPRESSOR) 25 MG tablet Take 25 mg by mouth daily. Once daily     Multiple Vitamins-Minerals (CENTRUM SILVER ULTRA WOMENS PO) Take 1 tablet by mouth every morning.      pantoprazole (PROTONIX) 40 MG tablet TAKE 1 TABLET ONCE DAILY 90 tablet 0   rosuvastatin (CRESTOR) 10 MG tablet TAKE 1 TABLET DAILY 90 tablet 0   venlafaxine (EFFEXOR) 37.5 MG tablet Take 37.5 mg by mouth daily. Once a day at noon.     busPIRone (BUSPAR) 15 MG tablet TAKE ONE TABLET TWICE DAILY (Patient not taking: Reported on 02/14/2022) 180 tablet 0   furosemide (LASIX) 20 MG tablet Take 20 mg by mouth as needed. (Patient not taking: Reported on 02/14/2022)     meclizine (ANTIVERT) 12.5 MG tablet Take 1 tablet (12.5 mg total) by mouth 3 (three) times daily as needed for dizziness. (Patient not taking: Reported on 03/28/2022) 30 tablet 0   metoprolol succinate (TOPROL-XL) 25 MG 24 hr tablet Take 0.5 tablets (12.5 mg total) by mouth daily. Take with or immediately following a meal. (Patient taking differently: Take 12.5 mg by mouth 2 (two) times daily. Take with or immediately following a meal.) 15 tablet 11   nitroGLYCERIN (NITROSTAT) 0.4 MG SL tablet PLACE 1 TABLET UNDER THE TONGUE AT ONSET OF CHEST PAIN EVERY 5 MINTUES UP TO 3 TIMES AS NEEDED 25 tablet 2   potassium chloride SA (KLOR-CON) 20 MEQ tablet Take 20 mEq by mouth 3 (three) times a week. (Patient not taking: Reported on 02/14/2022)     valsartan (DIOVAN) 40 MG tablet Take 0.5 tablets (20 mg total) by mouth daily as needed (For a systolic over 180). (Patient not taking: Reported on 05/04/2022) 15 tablet 11   No current facility-administered medications for this visit.    Past Medical History:  Diagnosis Date   AICD (automatic cardioverter/defibrillator) present    Allergy    SESONAL   Aneurysm of infrarenal abdominal aorta    Anxiety    ARTHRITIS    Arthritis    Atrial fibrillation    CAD (coronary artery disease)    a. history of cardiac arrest 1993;  b. s/p LAD/LCX stenting in 2003;  c. 07/2011 Cath: LM nl, LAD patent stent, D1 80ost, LCX patent stent, RCA min irregs;  d. 04/2012 MV: EF 66%, no ishcemia;  e. 12/2013 Echo: Ef 55%, no  rwma, Gr 1 DD, triv AI/MR, mildly dil LA;  f. 12/2013 Lexi MV: intermediate risk - apical ischemia and inf/infsept fixed defect, ? artifactual.   CAD in native artery 12/31/2013   Previous stents to LAD and Ramus patent on cath today 12/31/13 also There is severe disease in the ostial first diagonal which is unchanged from most recent cardiac catheterization. The right coronary artery could not be engaged selectively but nonselective angiography showed no significant disease in the proximal and midsegment.       Cataract    DENIES   Chronic back pain    Chronic sinus bradycardia    CKD (chronic kidney disease), stage III    Clotting disorder    DVT   COLITIS 12/02/2007   Qualifier: Diagnosis of  By: Koleen Distance CMA Duncan Dull), Hulan Saas     Diabetes mellitus without complication    DENIES   Diverticulosis of colon (without mention of hemorrhage) 2007   Colonoscopy    Esophageal stricture    a. 2012 s/p dil.   Esophagitis, unspecified    a. 2012 EGD   EXTERNAL HEMORRHOIDS    GERD (gastroesophageal reflux disease)    omeprazole   H/O hiatal hernia    Hiatal hernia    a. 2012 EGD.   History of DVT in the past, not on Coumadin now    left  leg   HYPERCHOLESTEROLEMIA    Hypertension    ICD (implantable cardiac defibrillator) in place    a. s/p initial ICD in 1993 in setting of cardiac arrest;  b. 01/2007 gen change: Guidant T135 Vitality DS VR single lead ICD.   Macular degeneration    gets injecton in eye every 5 weeks- last injection - 05/03/2013    Myocardial infarction 1993   Near syncope 05/22/2015   Nephrolithiasis, just saw Dr Annabell Howells- "OK"    Orthostatic hypotension    Peripheral vascular disease    ???   RECTAL BLEEDING 12/03/2007   Qualifier: Diagnosis of  By: Jarold Motto MD Lang Snow    Unspecified gastritis and gastroduodenitis without mention of hemorrhage    a. 2003 EGD->not noted on 2012 EGD.   Unstable angina, neg MI, cath stable.maybe GI 12/30/2013    Past Surgical  History:  Procedure Laterality Date   BREAST BIOPSY Left 11/2013   CARDIAC CATHETERIZATION     CARDIAC CATHETERIZATION  01/01/15   patent stents -there is severe disease in ostial 1st diag but without change.    CARDIAC DEFIBRILLATOR PLACEMENT  02/05/2007   Guidant   CARDIOVASCULAR STRESS TEST  12/30/2013   abnormal   CARDIOVERSION N/A 02/13/2019   Procedure: CARDIOVERSION;  Surgeon: Thurmon Fair, MD;  Location: [facility];  Service: Cardiovascular;  Laterality: N/A;   CATARACT EXTRACTION Right    Dr. Dione Booze   COLONOSCOPY     COLONOSCOPY WITH PROPOFOL N/A 03/30/2020   Procedure: COLONOSCOPY WITH PROPOFOL;  Surgeon: Dolores Frame, MD;  Location: [facility];  Service: Gastroenterology;  Laterality: N/A;   coronary stents      CYSTOSCOPY WITH URETEROSCOPY Right 05/20/2013   Procedure: CYSTOSCOPY, RIGHT URETEROSCOPY STONE EXTRACTION, Insertion of right DOUBLE J STENT ;  Surgeon: Bjorn Pippin, MD;  Location: [facility];  Service: Urology;  Laterality: Right;   CYSTOSCOPY WITH URETEROSCOPY AND STENT PLACEMENT N/A 06/03/2013   Procedure: SECOND LOOK CYSTOSCOPY WITH URETEROSCOPY  HOLMIUM LASER LITHO AND STONE EXTRACTION Newman Nip ;  Surgeon: Anner Crete, MD;  Location: [facility];  Service: Urology;  Laterality: N/A;   ESOPHAGOGASTRODUODENOSCOPY (EGD) WITH PROPOFOL N/A 11/20/2019  Procedure: ESOPHAGOGASTRODUODENOSCOPY (EGD) WITH PROPOFOL;  Surgeon: Corbin Ade, MD;  Location: [facility];  Service: Endoscopy;  Laterality: N/A;   GIVENS CAPSULE STUDY N/A 10/30/2016   Procedure: GIVENS CAPSULE STUDY;  Surgeon: Hilarie Fredrickson, MD;  Location: [facility] ENDOSCOPY;  Service: Endoscopy;  Laterality: N/A;  NEEDS TO BE ADMITTED FOR OBSERVATION PT. HAS DEFIB   HEMORROIDECTOMY  80's   HOLMIUM LASER APPLICATION Right 05/20/2013   Procedure: HOLMIUM LASER APPLICATION;  Surgeon: Bjorn Pippin, MD;  Location: [facility];  Service: Urology;  Laterality: Right;   HYSTEROSCOPY WITH D & C  09/02/2010   Procedure:  DILATATION AND CURETTAGE (D&C) /HYSTEROSCOPY;  Surgeon: Meriel Pica;  Location: [facility];  Service: Gynecology;  Laterality: N/A;  Dilation and Curettage with Hysteroscopy and Polypectomy   IMPLANTABLE CARDIOVERTER DEFIBRILLATOR (ICD) GENERATOR CHANGE N/A 02/10/2014   Procedure: ICD GENERATOR CHANGE;  Surgeon: Thurmon Fair, MD;  Location: [facility];  Service: Cardiovascular;  Laterality: N/A;   LEFT HEART CATHETERIZATION WITH CORONARY ANGIOGRAM N/A 07/28/2011   Procedure: LEFT HEART CATHETERIZATION WITH CORONARY ANGIOGRAM;  Surgeon: Runell Gess, MD;  Location: [facility] CATH LAB;  Service: Cardiovascular;  Laterality: N/A;   LEFT HEART CATHETERIZATION WITH CORONARY ANGIOGRAM N/A 12/31/2013   Procedure: LEFT HEART CATHETERIZATION WITH CORONARY ANGIOGRAM;  Surgeon: Iran Ouch, MD;  Location: [facility];  Service: Cardiovascular;  Laterality: N/A;    Family History  Problem Relation Age of Onset   Stroke Mother    Other Mother        brain tumor   Other Father        MI   Heart attack Father    Stroke Sister    Macular degeneration Sister    Diabetes Daughter    Cancer Daughter        ovarian   Colon polyps Daughter    Atrial fibrillation Sister    Hyperlipidemia Sister    Osteoporosis Sister    Stroke Sister    Uterine cancer Sister    Heart attack Brother    Heart disease Brother    Asthma Brother    Breast cancer Neg Hx     Allergies as of 05/04/2022 - Review Complete 05/04/2022  Allergen Reaction Noted   Sotalol Other (See Comments) 11/01/2016   Tramadol Other (See Comments) 12/23/2019   Actos [pioglitazone] Swelling 07/31/2012   Adhesive [tape] Other (See Comments) 02/20/2014   Atorvastatin Other (See Comments) 07/31/2012   Bactrim [sulfamethoxazole-trimethoprim] Itching and Rash 08/19/2016   Ciprofloxacin Nausea Only 02/05/2018   Contrast media [iodinated contrast media] Other (See Comments) 07/25/2011   Latex Rash 05/29/2013   Pedi-pre tape spray [wound  dressing adhesive] Other (See Comments) 02/20/2014   Sulfa antibiotics Other (See Comments) 02/20/2014    Social History   Socioeconomic History   Marital status: Widowed    Spouse name: Not on file   Number of children: 4   Years of education: 8   Highest education level: 8th grade  Occupational History   Occupation: Retired  Tobacco Use   Smoking status: Never   Smokeless tobacco: Never  Vaping Use   Vaping Use: Never used  Substance and Sexual Activity   Alcohol use: No   Drug use: No   Sexual activity: Never    Birth control/protection: Post-menopausal  Other Topics Concern   Not on file  Social History Narrative   Lives alone - son visits Sundays, daughter visits Saturday   Has an aide Marcha Solders)  5 days per week   Social Determinants of Health   Financial Resource Strain: Low Risk  (09/27/2020)   Overall Financial Resource Strain (CARDIA)    Difficulty of Paying Living Expenses: Not hard at all  Food Insecurity: No Food Insecurity (09/27/2020)   Hunger Vital Sign    Worried About Running Out of Food in the Last Year: Never true    Ran Out of Food in the Last Year: Never true  Transportation Needs: No Transportation Needs (09/27/2020)   PRAPARE - Administrator, Civil Service (Medical): No    Lack of Transportation (Non-Medical): No  Physical Activity: Inactive (03/15/2021)   Exercise Vital Sign    Days of Exercise per Week: 0 days    Minutes of Exercise per Session: 0 min  Stress: Stress Concern Present (03/15/2021)   [employer] of Occupational Health - Occupational Stress Questionnaire    Feeling of Stress : To some extent  Social Connections: Moderately Isolated (09/27/2020)   Social Connection and Isolation Panel [NHANES]    Frequency of Communication with Friends and Family: More than three times a week    Frequency of Social Gatherings with Friends and Family: Twice a week    Attends Religious Services: 1 to 4 times per year     Active Member of [employer] or Organizations: No    Attends Banker Meetings: Never    Marital Status: Widowed     Review of Systems   Gen: Denies fever, chills, anorexia. Denies fatigue, weakness, weight loss.  CV: Denies chest pain, palpitations, syncope, peripheral edema, and claudication. Resp: Denies dyspnea at rest, cough, wheezing, coughing up blood, and pleurisy. GI: See HPI Derm: Denies rash, itching, dry skin Psych: Denies depression, anxiety, memory loss, confusion. No homicidal or suicidal ideation.  Heme: Denies bruising, bleeding, and enlarged lymph nodes.   Physical Exam   BP (!) 136/52   Pulse (!) 45   Temp 97.6 F (36.4 C)   Ht 5\' 3"  (1.6 m)   Wt 121 lb 9.6 oz (55.2 kg)   BMI 21.54 kg/m   General:   Alert and oriented. No distress noted. Pleasant and cooperative.  Head:  Normocephalic and atraumatic. Eyes:  Conjuctiva clear without scleral icterus. Mouth:  Oral mucosa pink and moist. Good dentition. No lesions. Lungs:  Clear to auscultation bilaterally. No wheezes, rales, or rhonchi. No distress.  Heart:  bradycardic Abdomen:  +BS, soft, non-distended. Ttp to mid abdomen and lower abdomen. No rebound or guarding. No HSM or masses noted. Rectal: deferred Msk:  Symmetrical without gross deformities. Normal posture. Extremities:  Without edema. Neurologic:  Alert and  oriented x4 Psych:  Alert and cooperative. Normal mood and affect.   Assessment  [name] is a 89 y.o. female with a history of GERD, C. difficile in January 2022, microscopic colitis on pathology in 2007, 2009, 2012, chronic pain.  She has had extensive evaluation of abdominal pain in 2021-2022 with EGD, CTA November 2021, abdominal ultrasound without gallstones in March 2022, HIDA,  colonoscopy in March 2022 and repeat CTA March 2022 without evidence of large vessel disease. She has had ACTH stimulation test that was within normal limits.  She was suspected of having  microvascular angina versus bowel hypersensitivity.  Evidence of pancreatic lesion on imaging dating back to 2020 thought to be a cyst versus IPMN versus fat without dedicated pancreatic imaging.  She was recently admitted to the hospital in January 2023 and November 2023 with stroke  like symptoms .  She is presenting today with difficult to characterize abdominal pain and GERD. History somewhat difficult to obtain.   GERD: Not well controlled on pantoprazole once daily. Will increase to twice daily dosing.   Abdominal pain, lack of appetite. : Chronic and usually intermittent in nature, post prandial. Extensive workup as outlined in HPI. No recent contrast abdominal/pelvic imaging since June 2022. Most recent non contrast CT A/P in November 2023 with evidence of cholelithiasis and small hiatal hernia. She continues to describe abdominal pain as knotting and crampy and pressure mid abdomen along with bloating. Simethicone appears to be helpful at times and reports intermittent increase in pain postprandially. Pain improves slightly with laying down instead of sitting up. With weight loss, poor appetite there is concern for ischemic colitis given extensive ischemic history therefore I would like to reevaluate with a CTA since there is not any abdominopelvic imaging with contrast since 2022. Mild improvement with simethicone. Query IBS with gassiness. Given age and co-morbidities she is not a great candidate for treatment of abdominal pain with dicyclomine or Levsin. Advised increase in protein intake given prior weight loss and to increase PPI to BID and simethicone for breakthrough. Discussed need for changes in meal frequency. Encouraged more frequent smaller meals and increase in protein diet. Continue 1 boost/ensure daily.   PLAN   Ensure or boost one per day Increase protein in diet Eat small frequent meals 4-6 times a day versus 3 larger meals. Pantoprazole 40 mg twice daily.  Continue simethicone  as needed.  Will repeat CTA A/P (has tolerated in the past with no need for pre medication)  Follow up in 2 months.    Brooke Bonito, MSN, FNP-BC, AGACNP-BC [facility]

## 2022-05-04 NOTE — Patient Instructions (Addendum)
Please drink Ensure or boost one per day and focus on increased protein intake. You would benefit from more frequent smaller meals throughout the day rather than 3 larger meals.  Increase your pantoprazole to take 40 mg twice daily, 30 minutes prior to breakfast and dinner.  I will discuss her case further with Dr. Marletta Lor to see if he has any further recommendations for treatment or evaluation of the abdominal pain.  Once we were able to obtain authorization from pace and get the current premedication orders correct.   It was a pleasure to see you today. I want to create trusting relationships with patients. If you receive a survey regarding your visit,  I greatly appreciate you taking time to fill this out on paper or through your MyChart. I value your feedback.  Brooke Bonito, MSN, FNP-BC, AGACNP-BC [facility]

## 2022-05-08 MED ORDER — PANTOPRAZOLE SODIUM 40 MG PO TBEC: 40.0000 mg | DELAYED_RELEASE_TABLET | Freq: Two times a day (BID) | ORAL | 0 refills | Status: DC

## 2022-05-09 NOTE — Progress Notes (Signed)
[facility] - Clinic Note  05/23/2022     CHIEF COMPLAINT Patient presents for Retina Follow Up   HISTORY OF PRESENT ILLNESS: [name] is a 89 y.o. female who presents to the clinic today for:   HPI     Retina Follow Up   Patient presents with  Wet AMD.  In right eye.  Severity is moderate.  Duration of 8 weeks.  Since onset it is stable.  I, the attending physician,  performed the HPI with the patient and updated documentation appropriately.        Comments   Pt here for 8 wk ret f/u exu ARMD OD. Pt states VA is the same, no changes.       Last edited by Rennis Chris, MD on 05/27/2022  1:45 AM.     Patient feels that the vision is about the same.   Referring physician: No referring provider defined for this encounter.  HISTORICAL INFORMATION:   Selected notes from the MEDICAL RECORD NUMBER [name] -- transferring care due to insurance LEE: 09.07.23 -- BCVA CF OU Ocular Hx- ex ARMD OD -- s/p multiple IVA, last IVA OD 09.07.24 (4+ mos) PMH-    CURRENT MEDICATIONS: Current Outpatient Medications (Ophthalmic Drugs)  Medication Sig   carboxymethylcellulose (REFRESH PLUS) 0.5 % SOLN Place 1 drop into both eyes 3 (three) times daily as needed (dry/irritated eyes.). (Patient not taking: Reported on 05/18/2022)   latanoprost (XALATAN) 0.005 % ophthalmic solution Place 1 drop into the left eye at bedtime. (Patient not taking: Reported on 05/18/2022)   No current facility-administered medications for this visit. (Ophthalmic Drugs)   Current Outpatient Medications (Other)  Medication Sig   Alpha-Lipoic Acid 600 MG TABS Take 600 mg by mouth daily. For neuropathy in legs   amiodarone (PACERONE) 200 MG tablet Take 1 tablet (200 mg total) by mouth daily.   amLODipine (NORVASC) 2.5 MG tablet Take 1 tablet (2.5 mg total) by mouth daily. Do NOT take if BP drops below 130 on top or below 60 on bottom.   ELIQUIS 2.5 MG TABS tablet TAKE (1) TABLET TWICE A  DAY.   gabapentin (NEURONTIN) 100 MG capsule TAKE UP TO 3 CAPSULES AT BEDTIME AS NEEDED   isosorbide mononitrate (IMDUR) 60 MG 24 hr tablet Take 1 tablet (60 mg total) by mouth daily.   levothyroxine (SYNTHROID) 100 MCG tablet TAKE ONE TABLET BY MOUTH EVERY MORNING   MELATONIN PO Take 3 mg by mouth at bedtime.   metoprolol tartrate (LOPRESSOR) 25 MG tablet Take 25 mg by mouth daily. Once daily   Multiple Vitamins-Minerals (CENTRUM SILVER ULTRA WOMENS PO) Take 1 tablet by mouth every morning.   nitroGLYCERIN (NITROSTAT) 0.4 MG SL tablet PLACE 1 TABLET UNDER THE TONGUE AT ONSET OF CHEST PAIN EVERY 5 MINTUES UP TO 3 TIMES AS NEEDED   pantoprazole (PROTONIX) 40 MG tablet Take 1 tablet (40 mg total) by mouth 2 (two) times daily before a meal.   rosuvastatin (CRESTOR) 10 MG tablet TAKE 1 TABLET DAILY   simethicone (MYLICON) 125 MG chewable tablet Chew 125 mg by mouth every 6 (six) hours as needed for flatulence.   venlafaxine (EFFEXOR) 37.5 MG tablet Take 37.5 mg by mouth daily. Once a day at noon.   acetaminophen (TYLENOL) 500 MG tablet Take 1 tablet (500 mg total) by mouth every 6 (six) hours as needed for mild pain, fever or headache. (Patient not taking: Reported on 05/18/2022)   aspirin EC 81  MG EC tablet Take 1 tablet (81 mg total) by mouth daily. Swallow whole. (Patient not taking: Reported on 05/23/2022)   busPIRone (BUSPAR) 15 MG tablet TAKE ONE TABLET TWICE DAILY (Patient not taking: Reported on 05/18/2022)   Cholecalciferol (VITAMIN D) 50 MCG (2000 UT) CAPS Take 2,000 Units by mouth every morning. (Patient not taking: Reported on 05/18/2022)   feeding supplement (ENSURE ENLIVE / ENSURE PLUS) LIQD Take 237 mLs by mouth 2 (two) times daily between meals. (Patient not taking: Reported on 05/18/2022)   furosemide (LASIX) 20 MG tablet Take 20 mg by mouth as needed. (Patient not taking: Reported on 05/18/2022)   meclizine (ANTIVERT) 12.5 MG tablet Take 1 tablet (12.5 mg total) by mouth 3 (three) times daily as  needed for dizziness. (Patient not taking: Reported on 05/18/2022)   metoprolol succinate (TOPROL-XL) 25 MG 24 hr tablet Take 0.5 tablets (12.5 mg total) by mouth daily. Take with or immediately following a meal. (Patient taking differently: Take 12.5 mg by mouth 2 (two) times daily. Take with or immediately following a meal.)   potassium chloride SA (KLOR-CON) 20 MEQ tablet Take 20 mEq by mouth 3 (three) times a week. (Patient not taking: Reported on 05/18/2022)   valsartan (DIOVAN) 40 MG tablet Take 0.5 tablets (20 mg total) by mouth daily as needed (For a systolic over 180). (Patient not taking: Reported on 05/18/2022)   No current facility-administered medications for this visit. (Other)   REVIEW OF SYSTEMS: ROS   Positive for: Cardiovascular, Eyes Negative for: Constitutional, Gastrointestinal, Neurological, Skin, Genitourinary, Musculoskeletal, HENT, Endocrine, Respiratory, Psychiatric, Allergic/Imm, Heme/Lymph Last edited by Thompson Grayer, COT on 05/23/2022  9:15 AM.      ALLERGIES Allergies  Allergen Reactions   Sotalol Other (See Comments)    Torsades    Tramadol Other (See Comments)    Hallucination, bad-dream   Actos [Pioglitazone] Swelling   Adhesive [Tape] Other (See Comments)    Causes sores   Atorvastatin Other (See Comments)    myalgia   Bactrim [Sulfamethoxazole-Trimethoprim] Itching and Rash    Itchy rash   Ciprofloxacin Nausea Only   Contrast Media [Iodinated Contrast Media] Other (See Comments)    Headache (no action/pre-med required)   Latex Rash   Pedi-Pre Tape Spray [Wound Dressing Adhesive] Other (See Comments)    Causes sores   Sulfa Antibiotics Other (See Comments)    Causes sores   PAST MEDICAL HISTORY Past Medical History:  Diagnosis Date   AICD (automatic cardioverter/defibrillator) present    Allergy    SESONAL   Aneurysm of infrarenal abdominal aorta (HCC)    Anxiety    ARTHRITIS    Arthritis    Atrial fibrillation (HCC)    CAD (coronary  artery disease)    a. history of cardiac arrest 1993;  b. s/p LAD/LCX stenting in 2003;  c. 07/2011 Cath: LM nl, LAD patent stent, D1 80ost, LCX patent stent, RCA min irregs;  d. 04/2012 MV: EF 66%, no ishcemia;  e. 12/2013 Echo: Ef 55%, no rwma, Gr 1 DD, triv AI/MR, mildly dil LA;  f. 12/2013 Lexi MV: intermediate risk - apical ischemia and inf/infsept fixed defect, ? artifactual.   CAD in native artery 12/31/2013   Previous stents to LAD and Ramus patent on cath today 12/31/13 also There is severe disease in the ostial first diagonal which is unchanged from most recent cardiac catheterization. The right coronary artery could not be engaged selectively but nonselective angiography showed no significant disease in the proximal and  midsegment.       Cataract    DENIES   Chronic back pain    Chronic sinus bradycardia    CKD (chronic kidney disease), stage III (HCC)    Clotting disorder (HCC)    DVT   COLITIS 12/02/2007   Qualifier: Diagnosis of  By: Koleen Distance CMA Duncan Dull), Hulan Saas     Diabetes mellitus without complication (HCC)    DENIES   Diverticulosis of colon (without mention of hemorrhage) 2007   Colonoscopy    Esophageal stricture    a. 2012 s/p dil.   Esophagitis, unspecified    a. 2012 EGD   EXTERNAL HEMORRHOIDS    GERD (gastroesophageal reflux disease)    omeprazole   H/O hiatal hernia    Hiatal hernia    a. 2012 EGD.   History of DVT in the past, not on Coumadin now    left  leg   HYPERCHOLESTEROLEMIA    Hypertension    ICD (implantable cardiac defibrillator) in place    a. s/p initial ICD in 1993 in setting of cardiac arrest;  b. 01/2007 gen change: Guidant T135 Vitality DS VR single lead ICD.   Macular degeneration    gets injecton in eye every 5 weeks- last injection - 05/03/2013    Myocardial infarction [facility]) 1993   Near syncope 05/22/2015   Nephrolithiasis, just saw Dr Annabell Howells- "OK"    Orthostatic hypotension    Peripheral vascular disease (HCC)    ???   RECTAL BLEEDING  12/03/2007   Qualifier: Diagnosis of  By: Jarold Motto MD Mervyn Skeeters R    Unspecified gastritis and gastroduodenitis without mention of hemorrhage    a. 2003 EGD->not noted on 2012 EGD.   Unstable angina, neg MI, cath stable.maybe GI 12/30/2013   Past Surgical History:  Procedure Laterality Date   BREAST BIOPSY Left 11/2013   CARDIAC CATHETERIZATION     CARDIAC CATHETERIZATION  01/01/15   patent stents -there is severe disease in ostial 1st diag but without change.    CARDIAC DEFIBRILLATOR PLACEMENT  02/05/2007   Guidant   CARDIOVASCULAR STRESS TEST  12/30/2013   abnormal   CARDIOVERSION N/A 02/13/2019   Procedure: CARDIOVERSION;  Surgeon: Thurmon Fair, MD;  Location: [facility] ENDOSCOPY;  Service: Cardiovascular;  Laterality: N/A;   CATARACT EXTRACTION Right    Dr. Dione Booze   COLONOSCOPY     COLONOSCOPY WITH PROPOFOL N/A 03/30/2020   Procedure: COLONOSCOPY WITH PROPOFOL;  Surgeon: Dolores Frame, MD;  Location: [facility] ENDO SUITE;  Service: Gastroenterology;  Laterality: N/A;   coronary stents      CYSTOSCOPY WITH URETEROSCOPY Right 05/20/2013   Procedure: CYSTOSCOPY, RIGHT URETEROSCOPY STONE EXTRACTION, Insertion of right DOUBLE J STENT ;  Surgeon: Bjorn Pippin, MD;  Location: [facility] ORS;  Service: Urology;  Laterality: Right;   CYSTOSCOPY WITH URETEROSCOPY AND STENT PLACEMENT N/A 06/03/2013   Procedure: SECOND LOOK CYSTOSCOPY WITH URETEROSCOPY  HOLMIUM LASER LITHO AND STONE EXTRACTION Newman Nip ;  Surgeon: Anner Crete, MD;  Location: [facility] ORS;  Service: Urology;  Laterality: N/A;   ESOPHAGOGASTRODUODENOSCOPY (EGD) WITH PROPOFOL N/A 11/20/2019   Procedure: ESOPHAGOGASTRODUODENOSCOPY (EGD) WITH PROPOFOL;  Surgeon: Corbin Ade, MD;  Location: [facility];  Service: Endoscopy;  Laterality: N/A;   GIVENS CAPSULE STUDY N/A 10/30/2016   Procedure: GIVENS CAPSULE STUDY;  Surgeon: Hilarie Fredrickson, MD;  Location: [facility] ENDOSCOPY;  Service: Endoscopy;  Laterality: N/A;  NEEDS TO BE ADMITTED FOR OBSERVATION PT.  HAS DEFIB   HEMORROIDECTOMY  80's  HOLMIUM LASER APPLICATION Right 05/20/2013   Procedure: HOLMIUM LASER APPLICATION;  Surgeon: Bjorn Pippin, MD;  Location: [facility] ORS;  Service: Urology;  Laterality: Right;   HYSTEROSCOPY WITH D & C  09/02/2010   Procedure: DILATATION AND CURETTAGE (D&C) /HYSTEROSCOPY;  Surgeon: Meriel Pica;  Location: [facility] ORS;  Service: Gynecology;  Laterality: N/A;  Dilation and Curettage with Hysteroscopy and Polypectomy   IMPLANTABLE CARDIOVERTER DEFIBRILLATOR (ICD) GENERATOR CHANGE N/A 02/10/2014   Procedure: ICD GENERATOR CHANGE;  Surgeon: Thurmon Fair, MD;  Location: [facility] CATH LAB;  Service: Cardiovascular;  Laterality: N/A;   LEFT HEART CATHETERIZATION WITH CORONARY ANGIOGRAM N/A 07/28/2011   Procedure: LEFT HEART CATHETERIZATION WITH CORONARY ANGIOGRAM;  Surgeon: Runell Gess, MD;  Location: [facility] CATH LAB;  Service: Cardiovascular;  Laterality: N/A;   LEFT HEART CATHETERIZATION WITH CORONARY ANGIOGRAM N/A 12/31/2013   Procedure: LEFT HEART CATHETERIZATION WITH CORONARY ANGIOGRAM;  Surgeon: Iran Ouch, MD;  Location: [facility] CATH LAB;  Service: Cardiovascular;  Laterality: N/A;   FAMILY HISTORY Family History  Problem Relation Age of Onset   Stroke Mother    Other Mother        brain tumor   Other Father        MI   Heart attack Father    Stroke Sister    Macular degeneration Sister    Diabetes Daughter    Cancer Daughter        ovarian   Colon polyps Daughter    Atrial fibrillation Sister    Hyperlipidemia Sister    Osteoporosis Sister    Stroke Sister    Uterine cancer Sister    Heart attack Brother    Heart disease Brother    Asthma Brother    Breast cancer Neg Hx    SOCIAL HISTORY Social History   Tobacco Use   Smoking status: Never   Smokeless tobacco: Never  Vaping Use   Vaping Use: Never used  Substance Use Topics   Alcohol use: No   Drug use: No       OPHTHALMIC EXAM:  Base Eye Exam     Visual Acuity (Snellen - Linear)        Right Left   Dist cc 20/100 -2 20/100 -2   Dist ph cc NI 20/80 -2    Correction: Glasses         Tonometry (Tonopen, 9:25 AM)       Right Left   Pressure 10 12         Pupils       Pupils Dark Light Shape React APD   Right PERRL 3 2 Round Brisk None   Left PERRL 3 2 Round Brisk None         Visual Fields (Counting fingers)       Left Right    Full Full         Extraocular Movement       Right Left    Full, Ortho Full, Ortho         Neuro/Psych     Oriented x3: Yes   Mood/Affect: Normal         Dilation     Both eyes: 1.0% Mydriacyl, 2.5% Phenylephrine @ 9:25 AM           Slit Lamp and Fundus Exam     Slit Lamp Exam       Right Left   Lids/Lashes Dermatochalasis - upper lid Dermatochalasis - upper lid   Conjunctiva/Sclera White and quiet  White and quiet   Cornea 1-2+ Punctate epithelial erosions, well healed cataract wound 2+ fine Punctate epithelial erosions, well healed cataract wound   Anterior Chamber deep and clear deep and clear   Iris Round and dilated Round and dilated   Lens PC IOL in good position PC IOL in good position   Anterior Vitreous mild syneresis, Posterior vitreous detachment, Vitreous condensations mild syneresis, silicone oil microbubbles, PVD         Fundus Exam       Right Left   Disc mild Pallor, Sharp rim, temporal PPA mild Pallor, Sharp rim, temporal PPA   C/D Ratio 0.1 0.1   Macula Blunted foveal reflex, central GA with punctate heme superior macula--improved, Drusen, RPE mottling and clumping, +CNV Blunted foveal reflex, large area of GA centrally, Drusen, RPE mottling, clumping and atrophy, no heme   Vessels attenuated, Tortuous attenuated, mild tortuosity   Periphery Attached, reticular degeneration, No heme Attached, reticular degeneration, No heme           Refraction     Wearing Rx       Sphere Cylinder Axis Add   Right -1.00 +1.25 160 +2.25   Left -1.00 +0.75 150 +2.25  New rx specs  MS          IMAGING AND PROCEDURES  Imaging and Procedures for 05/23/2022  OCT, Retina - OU - Both Eyes       Right Eye Quality was good. Central Foveal Thickness: 309. Progression has been stable. Findings include normal foveal contour, no IRF, no SRF, retinal drusen , subretinal hyper-reflective material, pigment epithelial detachment, outer retinal atrophy (Central ORA with scattered PED / SRHM--stable, trace cystic changes temp fovea).   Left Eye Quality was good. Central Foveal Thickness: 268. Progression has been stable. Findings include normal foveal contour, no IRF, no SRF, retinal drusen , subretinal hyper-reflective material, outer retinal atrophy (Central ORA with [facility]).   Notes *Images captured and stored on drive  Diagnosis / Impression:  OD: Exudative ARMD - Central ORA with scattered PED / SRHM--stable,  trace cystic changes temp fovea OS: Nonexudative ARMD - Central ORA with [facility]  Clinical management:  See below  Abbreviations: NFP - Normal foveal profile. CME - cystoid macular edema. PED - pigment epithelial detachment. IRF - intraretinal fluid. SRF - subretinal fluid. EZ - ellipsoid zone. ERM - epiretinal membrane. ORA - outer retinal atrophy. ORT - outer retinal tubulation. SRHM - subretinal hyper-reflective material. IRHM - intraretinal hyper-reflective material      Intravitreal Injection, Pharmacologic Agent - OD - Right Eye       Time Out 05/23/2022. 10:28 AM. Confirmed correct patient, procedure, site, and patient consented.   Anesthesia Topical anesthesia was used. Anesthetic medications included Lidocaine 2%, Proparacaine 0.5%.   Procedure Preparation included 5% betadine to ocular surface, eyelid speculum. A supplied (32g) needle was used.   Injection: 1.25 mg Bevacizumab 1.25mg /0.56ml   Route: Intravitreal, Site: Right Eye   NDC: [id number], Lot: [id number] , Expiration date: 06/16/2022   Post-op Post injection exam found visual acuity of at  least counting fingers. The patient tolerated the procedure well. There were no complications. The patient received written and verbal post procedure care education. Post injection medications were not given.             ASSESSMENT/PLAN:    ICD-10-CM   1. Exudative age-related macular degeneration of right eye with active choroidal neovascularization (HCC)  H35.3211 OCT, Retina - OU - Both Eyes  Intravitreal Injection, Pharmacologic Agent - OD - Right Eye    Bevacizumab (AVASTIN) SOLN 1.25 mg    2. Advanced atrophic nonexudative age-related macular degeneration of left eye with subfoveal involvement  H35.3124     3. Essential hypertension  I10     4. Hypertensive retinopathy of both eyes  H35.033     5. Pseudophakia, both eyes  Z96.1      Exudative age related macular degeneration, right eye - previously managed by Dr. Luciana Axe - last visit and injection was 09.07.24 (IVA OD) -- pt was on a q6-8 wk maintenance schedule -- presented at 4+ mos since last IVA OD w/ [facility] - s/p IVA OD #1 (01.30.24), #2 (03.12.24)  - BCVA OD 20/100 improved from 20/150  - OCT OD: Central ORA with scattered PED / SRHM--stable, trace cystic changes temp fovea at 8 wks  - exam shows punctate IRH centrally,  trace cystic changes temp fovea - recommend IVA OD #3 today, 05.07.24 for punctate IRH and maintenance tx w/ f/u in 8 wks again  - pt wishes to proceed with injection  - RBA of procedure discussed, questions answered - IVA informed consent obtained and signed 01.30.24 - see procedure note  - f/u in 8 wks, DFE, OCT, possible injxn  2. Age related macular degeneration, non-exudative, left eye  - advanced stage with large central area of GA  - BCVA 20/80 from 20/100 - The incidence, anatomy, and pathology of dry AMD, risk of progression, and the AREDS and AREDS 2 study including smoking risks discussed with patient.  - Recommend amsler grid monitoring  3,4. Hypertensive retinopathy OU -  discussed importance of tight BP control - continue to monitor  5. Pseudophakia OU  - s/p CE/IOL OU (Dr. Zetta Bills)  - IOLs in good position, doing well  - continue to monitor  Ophthalmic Meds Ordered this visit:  Meds ordered this encounter  Medications   Bevacizumab (AVASTIN) SOLN 1.25 mg     Return in about 8 weeks (around 07/18/2022) for f/u Ex.AMD OD , DFE, OCT, Possible, IVA, OD.  There are no Patient Instructions on file for this visit.   Explained the diagnoses, plan, and follow up with the patient and they expressed understanding.  Patient expressed understanding of the importance of proper follow up care.   This document serves as a record of services personally performed by Karie Chimera, MD, PhD. It was created on their behalf by Gerilyn Nestle, COT an ophthalmic technician. The creation of this record is the provider's dictation and/or activities during the visit.    Electronically signed by:  Gerilyn Nestle, COT  05.07.24 1:45 AM  Karie Chimera, M.D., Ph.D. Diseases & Surgery of the Retina and Vitreous [facility]  I have reviewed the above documentation for accuracy and completeness, and I agree with the above. Karie Chimera, M.D., Ph.D. 05/27/22 1:50 AM   Abbreviations: M myopia (nearsighted); A astigmatism; H hyperopia (farsighted); P presbyopia; Mrx spectacle prescription;  CTL contact lenses; OD right eye; OS left eye; OU both eyes  XT exotropia; ET esotropia; PEK punctate epithelial keratitis; PEE punctate epithelial erosions; DES dry eye syndrome; MGD meibomian gland dysfunction; ATs artificial tears; PFAT's preservative free artificial tears; NSC nuclear sclerotic cataract; PSC posterior subcapsular cataract; ERM epi-retinal membrane; PVD posterior vitreous detachment; RD retinal detachment; DM diabetes mellitus; DR diabetic retinopathy; NPDR non-proliferative diabetic retinopathy; PDR proliferative diabetic retinopathy; CSME  clinically significant macular edema; DME diabetic macular edema;  dbh dot blot hemorrhages; CWS cotton wool spot; POAG primary open angle glaucoma; C/D cup-to-disc ratio; HVF humphrey visual field; GVF goldmann visual field; OCT optical coherence tomography; IOP intraocular pressure; BRVO Branch retinal vein occlusion; CRVO central retinal vein occlusion; CRAO central retinal artery occlusion; BRAO branch retinal artery occlusion; RT retinal tear; SB scleral buckle; PPV pars plana vitrectomy; VH Vitreous hemorrhage; PRP panretinal laser photocoagulation; IVK intravitreal kenalog; VMT vitreomacular traction; MH Macular hole;  NVD neovascularization of the disc; NVE neovascularization elsewhere; AREDS age related eye disease study; ARMD age related macular degeneration; POAG primary open angle glaucoma; EBMD epithelial/anterior basement membrane dystrophy; ACIOL anterior chamber intraocular lens; IOL intraocular lens; PCIOL posterior chamber intraocular lens; Phaco/IOL phacoemulsification with intraocular lens placement; [place] photorefractive keratectomy; LASIK laser assisted in situ keratomileusis; HTN hypertension; DM diabetes mellitus; COPD chronic obstructive pulmonary disease

## 2022-05-10 ENCOUNTER — Telehealth: Payer: Self-pay | Admitting: Gastroenterology

## 2022-05-10 NOTE — Telephone Encounter (Signed)
Following up to ensure we get patient scheduled for CTA A/P for evaluation of abdominal pain and weight loss. No need for pre medication. Order has been placed.   Renee Bonito, MSN, APRN, FNP-BC, AGACNP-BC [facility] at [facility]

## 2022-05-10 NOTE — Telephone Encounter (Signed)
Called pace of triage to see about pt getting this done. LMTCB at [phone]

## 2022-05-12 NOTE — Telephone Encounter (Signed)
Received VM from [facility] to discuss. [phone]  Called back and [facility]

## 2022-05-18 ENCOUNTER — Encounter: Payer: Self-pay | Admitting: Cardiovascular Disease

## 2022-05-18 ENCOUNTER — Ambulatory Visit: Payer: Medicare (Managed Care) | Attending: Cardiovascular Disease | Admitting: Cardiovascular Disease

## 2022-05-18 VITALS — BP 128/58 | HR 47 | Ht 63.0 in | Wt 120.0 lb

## 2022-05-18 DIAGNOSIS — I480 Paroxysmal atrial fibrillation: Secondary | ICD-10-CM | POA: Diagnosis not present

## 2022-05-18 DIAGNOSIS — T50905D Adverse effect of unspecified drugs, medicaments and biological substances, subsequent encounter: Secondary | ICD-10-CM

## 2022-05-18 DIAGNOSIS — Z7985 Long-term (current) use of injectable non-insulin antidiabetic drugs: Secondary | ICD-10-CM

## 2022-05-18 DIAGNOSIS — I951 Orthostatic hypotension: Secondary | ICD-10-CM

## 2022-05-18 DIAGNOSIS — Z79899 Other long term (current) drug therapy: Secondary | ICD-10-CM

## 2022-05-18 DIAGNOSIS — I25118 Atherosclerotic heart disease of native coronary artery with other forms of angina pectoris: Secondary | ICD-10-CM | POA: Diagnosis not present

## 2022-05-18 DIAGNOSIS — I700 Atherosclerosis of aorta: Secondary | ICD-10-CM

## 2022-05-18 DIAGNOSIS — I472 Ventricular tachycardia, unspecified: Secondary | ICD-10-CM

## 2022-05-18 DIAGNOSIS — D6869 Other thrombophilia: Secondary | ICD-10-CM

## 2022-05-18 DIAGNOSIS — E7800 Pure hypercholesterolemia, unspecified: Secondary | ICD-10-CM

## 2022-05-18 DIAGNOSIS — Z95810 Presence of automatic (implantable) cardiac defibrillator: Secondary | ICD-10-CM

## 2022-05-18 DIAGNOSIS — E119 Type 2 diabetes mellitus without complications: Secondary | ICD-10-CM

## 2022-05-18 DIAGNOSIS — R001 Bradycardia, unspecified: Secondary | ICD-10-CM

## 2022-05-18 DIAGNOSIS — K551 Chronic vascular disorders of intestine: Secondary | ICD-10-CM

## 2022-05-18 DIAGNOSIS — R4189 Other symptoms and signs involving cognitive functions and awareness: Secondary | ICD-10-CM

## 2022-05-18 DIAGNOSIS — I10 Essential (primary) hypertension: Secondary | ICD-10-CM

## 2022-05-18 DIAGNOSIS — N1832 Chronic kidney disease, stage 3b: Secondary | ICD-10-CM

## 2022-05-18 LAB — COMPREHENSIVE METABOLIC PANEL
ALT: 33 IU/L — ABNORMAL HIGH (ref 0–32)
AST: 40 IU/L (ref 0–40)
Albumin/Globulin Ratio: 1.8 (ref 1.2–2.2)
Albumin: 3.7 g/dL (ref 3.7–4.7)
Alkaline Phosphatase: 66 IU/L (ref 44–121)
BUN/Creatinine Ratio: 13 (ref 12–28)
BUN: 24 mg/dL (ref 8–27)
Bilirubin Total: 0.5 mg/dL (ref 0.0–1.2)
CO2: 19 mmol/L — ABNORMAL LOW (ref 20–29)
Calcium: 8.9 mg/dL (ref 8.7–10.3)
Chloride: 101 mmol/L (ref 96–106)
Creatinine, Ser: 1.91 mg/dL — ABNORMAL HIGH (ref 0.57–1.00)
Globulin, Total: 2.1 g/dL (ref 1.5–4.5)
Glucose: 168 mg/dL — ABNORMAL HIGH (ref 70–99)
Potassium: 3.9 mmol/L (ref 3.5–5.2)
Sodium: 136 mmol/L (ref 134–144)
Total Protein: 5.8 g/dL — ABNORMAL LOW (ref 6.0–8.5)
eGFR: 25 mL/min/{1.73_m2} — ABNORMAL LOW (ref >59)

## 2022-05-18 LAB — TSH: TSH: 1.64 u[IU]/mL (ref 0.450–4.500)

## 2022-05-18 NOTE — Progress Notes (Signed)
. Patient ID: [name], female   DOB: 26-Jul-1931, 89 y.o.   MRN: [id number]    Cardiology Office Note    Date:  05/18/2022   ID:  BETTYLEE FEIG, DOB 12/03/1931, MRN [id number]  PCP:  Jethro Bastos, MD  Cardiologist:   Thurmon Fair, MD   Chief Complaint  Patient presents with   Irregular Heart Beat          History of Present Illness:  ELLENORA TALTON is a 89 y.o. 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  She was hospitalized with COVID-19 infection in late November 2023.  She presented with weakness and a fall and was having symptoms that raise concern for possible stroke, but CT of the head did not show any evidence of abnormalities.  Probably had a period of hypoperfusion since she developed acute kidney injury and marked elevation in transaminases, both of which improved rapidly during the hospitalization.  Transaminases were normal at admission, peaked around 400, down to 100s-200s by the time of discharge.  Creatinine peaked at 1.46, was down to 1.29 by the time of discharge.  He is lost some more weight and her BMI is back down to 21.  Metabolic control has been good with a hemoglobin A1c of 6.1%, LDL 74, HDL 91.  No longer taking any diuretics.  Mostly has normal blood pressure, but occasionally takes a very low-dose of valsartan when her systolic blood pressure is high (she has only done that roughly 5 times in the last month).  This was prescribed by her nephrologist,  Dr. Wolfgang Phoenix.  Has not had any recent problems with palpitations.  Denies falls or bleeding.  Denies chest pain, orthopnea, PND, any change in exertional dyspnea (chronically NYHA functional class II.  Cognitive deficits remain mild-moderate, not much changed.  Primarily short-term memory loss, but still living independently with support from family and nursing aide.  We have been trying to avoid ventricular pacing by keeping her lower rate limit low at 45 bpm and decreasing her beta-blocker dose.  She does not have an atrial lead and has declined invasive procedures such as upgrade of her device to a dual-chamber device.  She also reiterates the fact that she does not want to have invasive procedures such as cardiac catheterization.    Irrigation of her defibrillator shows normal findings.  She never requires ventricular pacing.  Lead parameters are normal and estimated generator longevity is 9 years.  There has been no evidence of high ventricular rates.     She presented with a couple of episodes of abdominal pain evaluated in the emergency room in February and was finally hospitalized in March 2022.  She underwent a colonoscopy that showed diverticulosis and a carpet polyp (not removed due to anticoagulation), but no other major findings.  CT of the abdomen described a focal dissection of the infrarenal aorta (old).  There was no evidence of acute mesenteric ischemia, but the patient was diagnosed as probably having intestinal angina with microvascular etiology (versus bowel hypersensitivity), and improvement was reportedly noted with isosorbide mononitrate 30 mg daily..  Her abdominal  pain is a "knotting sensation",  clearly worsened by food and she has developed some degree of food aversion.  She has to eat very small meals.  Her nephrologist has been encouraging her to make sure that she drinks enough fluid.    She was hospitalized in June 2022 with cellulitis related to an ulceration of the left  great toe for about 4 days.  She was seen again in the emergency room with left leg pain (without evidence of recurrent infection) had a CT angiogram of the chest abdomen and pelvis, describing the presence of aortic atherosclerosis and "old appearing focal dissection flap or a penetrating ulcer in the distal abdominal aorta, lumen measures up to 2.1 cm at this level, no acute dissection, no aneurysm".  The focal dissection was also present on the study in March 2022.  The celiac artery, superior mesenteric artery and inferior mesenteric artery are specifically described as patent.  The right renal artery described as irregular, possible fibromuscular dysplasia without mention of actual stenosis.  There was no evidence of hepatic biliary duct dilation or gallstones.  Indeterminant 7 mm hypodense lesion in the neck of the pancreas was described, although this was not seen on the study from March 2022 and February 2020.  She has extensive chronic nephrolithiasis/nephrocalcinosis and T12 and L1 compression fractures which are old  In recent years she has had increasing problems with symptomatic atrial fibrillation.  Although she has a single chamber defibrillator, making it impossible to precisely diagnose the episodes of atrial fibrillation, there is clear evidence of periods of increased ventricular rate from her baseline, likely representing symptomatic atrial fibrillation.  She develops palpitation, chest discomfort, shortness of breath and if the spells are lengthy she eventually develops overt lower extremity edema.  She underwent a cardioversion in January 2021 from atrial fibrillation to normal rhythm, associated with improvement in symptoms.  She has been taking amiodarone since February.  She was hospitalized with dizziness, orthostatic hypotension and severe hypothyroidism (TSH 56) in July 2021  It is likely she had iatrogenic hypothyroidism due to amiodarone.  Levothyroxine was started, beta-blocker  therapy was discontinued.  She was on treatment with disopyramide for years for her history of catecholaminergic polymorphic VT, but this medication had to be gradually discontinued due to side effects, especially orthostatic hypotension and dry mouth.  In the past she developed torsades de pointes with sotalol and diarrhea with quinidine.  She has not had defibrillator discharges since the 1990s.   Past Medical History:  Diagnosis Date   AICD (automatic cardioverter/defibrillator) present    Allergy    SESONAL   Aneurysm of infrarenal abdominal aorta (HCC)    Anxiety    ARTHRITIS    Arthritis    Atrial fibrillation (HCC)    CAD (coronary artery disease)    a. history of cardiac arrest 1993;  b. s/p LAD/LCX stenting in 2003;  c. 07/2011 Cath: LM nl, LAD patent stent, D1 80ost, LCX patent stent, RCA min irregs;  d. 04/2012 MV: EF 66%, no ishcemia;  e. 12/2013 Echo: Ef 55%, no rwma, Gr 1 DD, triv AI/MR, mildly dil LA;  f. 12/2013 Lexi MV: intermediate risk - apical ischemia and inf/infsept fixed defect, ? artifactual.   CAD in native artery 12/31/2013   Previous stents to LAD and Ramus patent on cath today 12/31/13 also There is severe disease in the ostial first diagonal which is unchanged from most recent cardiac catheterization. The right coronary artery could not be engaged selectively but  nonselective angiography showed no significant disease in the proximal and midsegment.       Cataract    DENIES   Chronic back pain    Chronic sinus bradycardia    CKD (chronic kidney disease), stage III (HCC)    Clotting disorder (HCC)    DVT   COLITIS 12/02/2007   Qualifier: Diagnosis of  By: Koleen Distance CMA Duncan Dull), Hulan Saas     Diabetes mellitus without complication (HCC)    DENIES   Diverticulosis of colon (without mention of hemorrhage) 2007   Colonoscopy    Esophageal stricture    a. 2012 s/p dil.   Esophagitis, unspecified    a. 2012 EGD   EXTERNAL HEMORRHOIDS    GERD (gastroesophageal reflux  disease)    omeprazole   H/O hiatal hernia    Hiatal hernia    a. 2012 EGD.   History of DVT in the past, not on Coumadin now    left  leg   HYPERCHOLESTEROLEMIA    Hypertension    ICD (implantable cardiac defibrillator) in place    a. s/p initial ICD in 1993 in setting of cardiac arrest;  b. 01/2007 gen change: Guidant T135 Vitality DS VR single lead ICD.   Macular degeneration    gets injecton in eye every 5 weeks- last injection - 05/03/2013    Myocardial infarction [facility]) 1993   Near syncope 05/22/2015   Nephrolithiasis, just saw Dr Annabell Howells- "OK"    Orthostatic hypotension    Peripheral vascular disease (HCC)    ???   RECTAL BLEEDING 12/03/2007   Qualifier: Diagnosis of  By: Jarold Motto MD Mervyn Skeeters R    Unspecified gastritis and gastroduodenitis without mention of hemorrhage    a. 2003 EGD->not noted on 2012 EGD.   Unstable angina, neg MI, cath stable.maybe GI 12/30/2013    Past Surgical History:  Procedure Laterality Date   BREAST BIOPSY Left 11/2013   CARDIAC CATHETERIZATION     CARDIAC CATHETERIZATION  01/01/15   patent stents -there is severe disease in ostial 1st diag but without change.    CARDIAC DEFIBRILLATOR PLACEMENT  02/05/2007   Guidant   CARDIOVASCULAR STRESS TEST  12/30/2013   abnormal   CARDIOVERSION N/A 02/13/2019   Procedure: CARDIOVERSION;  Surgeon: Thurmon Fair, MD;  Location: [facility] ENDOSCOPY;  Service: Cardiovascular;  Laterality: N/A;   CATARACT EXTRACTION Right    Dr. Dione Booze   COLONOSCOPY     COLONOSCOPY WITH PROPOFOL N/A 03/30/2020   Procedure: COLONOSCOPY WITH PROPOFOL;  Surgeon: Dolores Frame, MD;  Location: [facility];  Service: Gastroenterology;  Laterality: N/A;   coronary stents      CYSTOSCOPY WITH URETEROSCOPY Right 05/20/2013   Procedure: CYSTOSCOPY, RIGHT URETEROSCOPY STONE EXTRACTION, Insertion of right DOUBLE J STENT ;  Surgeon: Bjorn Pippin, MD;  Location: [facility] ORS;  Service: Urology;  Laterality: Right;   CYSTOSCOPY WITH  URETEROSCOPY AND STENT PLACEMENT N/A 06/03/2013   Procedure: SECOND LOOK CYSTOSCOPY WITH URETEROSCOPY  HOLMIUM LASER LITHO AND STONE EXTRACTION Newman Nip ;  Surgeon: Anner Crete, MD;  Location: [facility] ORS;  Service: Urology;  Laterality: N/A;   ESOPHAGOGASTRODUODENOSCOPY (EGD) WITH PROPOFOL N/A 11/20/2019   Procedure: ESOPHAGOGASTRODUODENOSCOPY (EGD) WITH PROPOFOL;  Surgeon: Corbin Ade, MD;  Location: [facility];  Service: Endoscopy;  Laterality: N/A;   GIVENS CAPSULE STUDY N/A 10/30/2016   Procedure: GIVENS CAPSULE STUDY;  Surgeon: Hilarie Fredrickson, MD;  Location: [facility] ENDOSCOPY;  Service: Endoscopy;  Laterality: N/A;  NEEDS TO BE ADMITTED FOR  OBSERVATION PT. HAS DEFIB   HEMORROIDECTOMY  80's   HOLMIUM LASER APPLICATION Right 05/20/2013   Procedure: HOLMIUM LASER APPLICATION;  Surgeon: Bjorn Pippin, MD;  Location: [facility] OR[facility];  Service: Urology;  Laterality: Right;   HYSTEROSCOPY WITH D & C  09/02/2010   Procedure: DILATATION AND CURETTAGE (D&C) /HYSTEROSCOPY;  Surgeon: Meriel Pica;  Location: [facility] ORS;  Service: Gynecology;  Laterality: N/A;  Dilation and Curettage with Hysteroscopy and Polypectomy   IMPLANTABLE CARDIOVERTER DEFIBRILLATOR (ICD) GENERATOR CHANGE N/A 02/10/2014   Procedure: ICD GENERATOR CHANGE;  Surgeon: Thurmon Fair, MD;  Location: [facility] CATH LAB;  Service: Cardiovascular;  Laterality: N/A;   LEFT HEART CATHETERIZATION WITH CORONARY ANGIOGRAM N/A 07/28/2011   Procedure: LEFT HEART CATHETERIZATION WITH CORONARY ANGIOGRAM;  Surgeon: Runell Gess, MD;  Location: [facility] CATH LAB;  Service: Cardiovascular;  Laterality: N/A;   LEFT HEART CATHETERIZATION WITH CORONARY ANGIOGRAM N/A 12/31/2013   Procedure: LEFT HEART CATHETERIZATION WITH CORONARY ANGIOGRAM;  Surgeon: Iran Ouch, MD;  Location: [facility] CATH LAB;  Service: Cardiovascular;  Laterality: N/A;    Current Medications: Outpatient Medications Prior to Visit  Medication Sig Dispense Refill   Alpha-Lipoic Acid 600 MG TABS Take 600 mg by  mouth daily. For neuropathy in legs 90 tablet 3   amiodarone (PACERONE) 200 MG tablet Take 1 tablet (200 mg total) by mouth daily. 90 tablet 3   amLODipine (NORVASC) 2.5 MG tablet Take 1 tablet (2.5 mg total) by mouth daily. Do NOT take if BP drops below 130 on top or below 60 on bottom. 90 tablet 3   aspirin EC 81 MG EC tablet Take 1 tablet (81 mg total) by mouth daily. Swallow whole. 30 tablet 11   ELIQUIS 2.5 MG TABS tablet TAKE (1) TABLET TWICE A DAY. 180 tablet 1   gabapentin (NEURONTIN) 100 MG capsule TAKE UP TO 3 CAPSULES AT BEDTIME AS NEEDED 90 capsule 1   isosorbide mononitrate (IMDUR) 60 MG 24 hr tablet Take 1 tablet (60 mg total) by mouth daily. 30 tablet 11   levothyroxine (SYNTHROID) 100 MCG tablet TAKE ONE TABLET BY MOUTH EVERY MORNING 90 tablet 1   MELATONIN PO Take 3 mg by mouth at bedtime.     metoprolol tartrate (LOPRESSOR) 25 MG tablet Take 25 mg by mouth daily. Once daily     Multiple Vitamins-Minerals (CENTRUM SILVER ULTRA WOMENS PO) Take 1 tablet by mouth every morning.     pantoprazole (PROTONIX) 40 MG tablet Take 1 tablet (40 mg total) by mouth 2 (two) times daily before a meal. 90 tablet 0   rosuvastatin (CRESTOR) 10 MG tablet TAKE 1 TABLET DAILY 90 tablet 0   simethicone (MYLICON) 125 MG chewable tablet Chew 125 mg by mouth every 6 (six) hours as needed for flatulence.     venlafaxine (EFFEXOR) 37.5 MG tablet Take 37.5 mg by mouth daily. Once a day at noon.     acetaminophen (TYLENOL) 500 MG tablet Take 1 tablet (500 mg total) by mouth every 6 (six) hours as needed for mild pain, fever or headache. (Patient not taking: Reported on 05/18/2022) 30 tablet 0   busPIRone (BUSPAR) 15 MG tablet TAKE ONE TABLET TWICE DAILY (Patient not taking: Reported on 05/18/2022) 180 tablet 0   carboxymethylcellulose (REFRESH PLUS) 0.5 % SOLN Place 1 drop into both eyes 3 (three) times daily as needed (dry/irritated eyes.). (Patient not taking: Reported on 05/18/2022)     Cholecalciferol (VITAMIN D)  50 MCG (2000 UT) CAPS Take 2,000 Units by mouth  every morning. (Patient not taking: Reported on 05/18/2022)     feeding supplement (ENSURE ENLIVE / ENSURE PLUS) LIQD Take 237 mLs by mouth 2 (two) times daily between meals. (Patient not taking: Reported on 05/18/2022)     furosemide (LASIX) 20 MG tablet Take 20 mg by mouth as needed. (Patient not taking: Reported on 05/18/2022)     latanoprost (XALATAN) 0.005 % ophthalmic solution Place 1 drop into the left eye at bedtime. (Patient not taking: Reported on 05/18/2022)     meclizine (ANTIVERT) 12.5 MG tablet Take 1 tablet (12.5 mg total) by mouth 3 (three) times daily as needed for dizziness. (Patient not taking: Reported on 05/18/2022) 30 tablet 0   metoprolol succinate (TOPROL-XL) 25 MG 24 hr tablet Take 0.5 tablets (12.5 mg total) by mouth daily. Take with or immediately following a meal. (Patient taking differently: Take 12.5 mg by mouth 2 (two) times daily. Take with or immediately following a meal.) 15 tablet 11   nitroGLYCERIN (NITROSTAT) 0.4 MG SL tablet PLACE 1 TABLET UNDER THE TONGUE AT ONSET OF CHEST PAIN EVERY 5 MINTUES UP TO 3 TIMES AS NEEDED (Patient not taking: Reported on 05/18/2022) 25 tablet 2   potassium chloride SA (KLOR-CON) 20 MEQ tablet Take 20 mEq by mouth 3 (three) times a week. (Patient not taking: Reported on 05/18/2022)     valsartan (DIOVAN) 40 MG tablet Take 0.5 tablets (20 mg total) by mouth daily as needed (For a systolic over 180). (Patient not taking: Reported on 05/18/2022) 15 tablet 11   No facility-administered medications prior to visit.     Allergies:   Sotalol, Tramadol, Actos [pioglitazone], Adhesive [tape], Atorvastatin, Bactrim [sulfamethoxazole-trimethoprim], Ciprofloxacin, Contrast media [iodinated contrast media], Latex, Pedi-pre tape spray [wound dressing adhesive], and Sulfa antibiotics   Social History   Socioeconomic History   Marital status: Widowed    Spouse name: Not on file   Number of children: 4   Years of  education: 8   Highest education level: 8th grade  Occupational History   Occupation: Retired  Tobacco Use   Smoking status: Never   Smokeless tobacco: Never  Vaping Use   Vaping Use: Never used  Substance and Sexual Activity   Alcohol use: No   Drug use: No   Sexual activity: Never    Birth control/protection: Post-menopausal  Other Topics Concern   Not on file  Social History Narrative   Lives alone - son visits Sundays, daughter visits Saturday   Has an aide Marcha Solders) 5 days per week   Social Determinants of Health   Financial Resource Strain: Low Risk  (09/27/2020)   Overall Financial Resource Strain (CARDIA)    Difficulty of Paying Living Expenses: Not hard at all  Food Insecurity: No Food Insecurity (09/27/2020)   Hunger Vital Sign    Worried About Running Out of Food in the Last Year: Never true    Ran Out of Food in the Last Year: Never true  Transportation Needs: No Transportation Needs (09/27/2020)   PRAPARE - Administrator, Civil Service (Medical): No    Lack of Transportation (Non-Medical): No  Physical Activity: Inactive (03/15/2021)   Exercise Vital Sign    Days of Exercise per Week: 0 days    Minutes of Exercise per Session: 0 min  Stress: Stress Concern Present (03/15/2021)   [employer] of Occupational Health - Occupational Stress Questionnaire    Feeling of Stress : To some extent  Social Connections: Moderately Isolated (09/27/2020)  Social Advertising account executive [NHANES]    Frequency of Communication with Friends and Family: More than three times a week    Frequency of Social Gatherings with Friends and Family: Twice a week    Attends Religious Services: 1 to 4 times per year    Active Member of [employer] or Organizations: No    Attends Banker Meetings: Never    Marital Status: Widowed     Family History:  The patient's family history includes Asthma in her brother; Atrial fibrillation in her sister; Cancer  in her daughter; Colon polyps in her daughter; Diabetes in her daughter; Heart attack in her brother and father; Heart disease in her brother; Hyperlipidemia in her sister; Macular degeneration in her sister; Osteoporosis in her sister; Other in her father and mother; Stroke in her mother, sister, and sister; Uterine cancer in her sister.   ROS:   Please see the history of present illness.    ROS All other systems are reviewed and are negative.   PHYSICAL EXAM:   VS:  BP (!) 128/58 (BP Location: Left Arm, Patient Position: Sitting, Cuff Size: Normal)   Pulse (!) 47   Ht 5\' 3"  (1.6 m)   Wt 120 lb (54.4 kg)   SpO2 96%   BMI 21.26 kg/m       General: Alert, oriented x3, no distress, healthy ICD site.  The device sticks out prominently because of her weight loss. Head: no evidence of trauma, PERRL, EOMI, no exophtalmos or lid lag, no myxedema, no xanthelasma; normal ears, nose and oropharynx Neck: normal jugular venous pulsations and no hepatojugular reflux; brisk carotid pulses without delay and no carotid bruits Chest: clear to auscultation, no signs of consolidation by percussion or palpation, normal fremitus, symmetrical and full respiratory excursions Cardiovascular: normal position and quality of the apical impulse, regular rhythm, normal first and second heart sounds, no murmurs, rubs or gallops Abdomen: no tenderness or distention, no masses by palpation, no abnormal pulsatility or arterial bruits, normal bowel sounds, no hepatosplenomegaly Extremities: no clubbing, cyanosis or edema; 2+ radial, ulnar and brachial pulses bilaterally; 2+ right femoral, posterior tibial and dorsalis pedis pulses; 2+ left femoral, posterior tibial and dorsalis pedis pulses; no subclavian or femoral bruits Neurological: grossly nonfocal Psych: Normal mood and affect   Wt Readings from Last 3 Encounters:  05/18/22 120 lb (54.4 kg)  05/04/22 121 lb 9.6 oz (55.2 kg)  11/07/21 130 lb (59 kg)     Studies/Labs Reviewed:   EKG: ECG is ordered today and shows sinus bradycardia 47 bpm, first-degree AV block 340 ms, left bundle branch block with left axis deviation, QTc 474 ms Recent Labs: 11/07/2021: ALT 15; BUN 20; Creatinine, Ser 1.48; Hemoglobin 9.9; Platelets 256; Potassium 4.1; Sodium 139; TSH 5.480   Lipid Panel    Component Value Date/Time   CHOL 150 02/04/2021 0431   CHOL 139 10/23/2018 0947   CHOL 140 07/31/2012 1019   TRIG 45 02/04/2021 0431   TRIG 115 06/07/2015 1111   TRIG 161 (H) 07/31/2012 1019   HDL 72 02/04/2021 0431   HDL 53 10/23/2018 0947   HDL 68 06/07/2015 1111   HDL 43 07/31/2012 1019   CHOLHDL 2.1 02/04/2021 0431   VLDL 9 02/04/2021 0431   LDLCALC 69 02/04/2021 0431   LDLCALC 65 10/23/2018 0947   LDLCALC 65 09/09/2013 0841   LDLCALC 65 07/31/2012 1019     ASSESSMENT:    1. Coronary artery disease of native artery of  native heart with stable angina pectoris (HCC)   2. Paroxysmal atrial fibrillation (HCC)   3. On amiodarone therapy   4. Bradycardia, drug induced   5. VT (ventricular tachycardia) (HCC)   6. Acquired thrombophilia (HCC)   7. Stage 3b chronic kidney disease (HCC)   8. Essential hypertension   9. Orthostatic hypotension   10. ICD (implantable cardioverter-defibrillator) in place   11. Hypercholesterolemia   12. Atherosclerosis of aorta (HCC)   13. Cognitive decline   14. Intestinal angina (HCC)   15. Diabetes mellitus type 2, diet-controlled (HCC)       PLAN:  In order of problems listed above:  CAD/stable angina: Well-controlled on combination of beta-blocker and amlodipine and long-acting nitrates (doses are limted by bradycardia and previous orthostatic hypotension issues).  Willis reiterates that she does not want to have any invasive procedures.  It is quite likely that she has new coronary lesions, but she does not want to have a catheterization.  Consequently, it does not make any sense to perform noninvasive study  such as nuclear stress tests.    Although she has previously undergone 3 PCI-stent procedures she has not actually required revascularization since 2003.  She has undergone cardiac catheterization for chest pain with findings of patent coronary arteries in 2006, 2013 and 2015.  She is not the best candidate for invasive angiography in view of advanced age, diabetes mellitus, abnormal kidney function and need for anticoagulation, all of which place her at increased risk for complications.  We have had to decrease her beta-blocker dose due to bradycardia.  She becomes symptomatic when she has ventricular pacing.  Rate is borderline today at 47 bpm (device programmed to 45 bpm). AFib: There has been no evidence to support atrial fibrillation in the last many months.  It has been difficult to detect atrial fibrillation since she has a single chamber ICD.  She is clearly in normal rhythm today (sinus bradycardia).  Her heart rate histogram does not show any episodes of faster ventricular rates, which in the past had marked periods of atrial fibrillation.  On amiodarone and on appropriate anticoagulation with Eliquis. CHADSVasc 6 (age 69, gender, DM, CAD, hypertension).  We have discussed upgrading her device to a dual-chamber pacemaker in the past.  She has declined this and all other invasive procedures. Bradycardia: On amiodarone and a very low-dose of metoprolol succinate, due to unpleasant palpitations when she has ventricular pacing. Amiodarone: So far tolerating well.  Need to recheck liver function tests which were quite abnormal during her hospitalization in November.  Also recheck TSH. VT: This has been entirely abolished since amiodarone was initiated..  She has not had any therapy from her defibrillator since the early 1990s.  She did have a rather lengthy 25 beat nonsustained VT run in September 2020 when we decreased her beta-blocker dose due to bradycardia.   Eliquis: Dose adjusted for age and weight.   Need to also recheck her kidney function. CKD 3: baseline creatinine 1.2-1.4.  Seeing Dr. Wolfgang Phoenix. HTN/Orthostatic hypotension: She has had 2 episodes of presyncope presumably due to orthostatic hypotension, but none recently.  Tolerate SBP up to the high 140s ICD: Normal function of her single lead/single-chamber device.  The device sticks out very prominently due to her weight loss, but the site still looks very healthy.  Asked her to promptly report any sign of skin adherence to the underlying device or any other signs of compromise.   HLP: All lipid parameters are in target range.  Aortic atherosclerosis: Has a chronic focal dissection of the abdominal aorta.  This is asymptomatic.  Not a candidate for repair and declines invasive procedures.   Cognitive decline: Stable. Short-term memory continues to be a problem, but without any significant deterioration. Her family member, Rachell Druckenmiller is with her and helps out.  She has a Agricultural engineer during the day.  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 DM: controlled without meds.  Hemoglobin A1c 6.5%. Discussions about end-of -life care: I have had a discussion with Carrah and her family member Erskine Squibb about the fact that the defibrillator therapies may become undesirable if her physical and cognitive decline continues.  At some point, she should have a discussion with her family about long-term wishes.  She has already filled out DNR papers.  Reiterated the fact that if her quality of life deteriorates to a point that VT/VF therapies are not warranted, we can turn those therapies off, without compromising the monitoring or pacing features of her device.   Medication Adjustments/Labs and Tests Ordered: Current medicines are reviewed at length with the patient today.  Concerns regarding medicines are  outlined above.  Medication changes, Labs and Tests ordered today are listed in the Patient Instructions below. Patient Instructions  Medication Instructions:  No changes *If you need a refill on your cardiac medications before your next appointment, please call your pharmacy*   Lab Work: CMET, TSH- today If you have labs (blood work) drawn today and your tests are completely normal, you will receive your results only by: MyChart Message (if you have MyChart) OR A paper copy in the mail If you have any lab test that is abnormal or we need to change your treatment, we will call you to review the results.  Follow-Up: At [facility], you and your health needs are our priority.  As part of our continuing mission to provide you with exceptional heart care, we have created designated Provider Care Teams.  These Care Teams include your primary Cardiologist (physician) and Advanced Practice Providers (APPs -  Physician Assistants and Nurse Practitioners) who all work together to provide you with the care you need, when you need it.  We recommend signing up for the patient portal called "MyChart".  Sign up information is provided on this After Visit Summary.  MyChart is used to connect with patients for Virtual Visits (Telemedicine).  Patients are able to view lab/test results, encounter notes, upcoming appointments, etc.  Non-urgent messages can be sent to your provider as well.   To learn more about what you can do with MyChart, go to [email or web address].    Your next appointment:   6 month(s)- Phys Pacer Check   Provider:   Thurmon Fair, MD       Signed, Thurmon Fair, MD  05/18/2022 5:15 PM    [facility] [address] [place], [place], [place]  [address] Phone: [phone]; Fax: [phone]

## 2022-05-18 NOTE — Patient Instructions (Signed)
Medication Instructions:  No changes *If you need a refill on your cardiac medications before your next appointment, please call your pharmacy*   Lab Work: CMET, TSH- today If you have labs (blood work) drawn today and your tests are completely normal, you will receive your results only by: MyChart Message (if you have MyChart) OR A paper copy in the mail If you have any lab test that is abnormal or we need to change your treatment, we will call you to review the results.  Follow-Up: At [facility], you and your health needs are our priority.  As part of our continuing mission to provide you with exceptional heart care, we have created designated Provider Care Teams.  These Care Teams include your primary Cardiologist (physician) and Advanced Practice Providers (APPs -  Physician Assistants and Nurse Practitioners) who all work together to provide you with the care you need, when you need it.  We recommend signing up for the patient portal called "MyChart".  Sign up information is provided on this After Visit Summary.  MyChart is used to connect with patients for Virtual Visits (Telemedicine).  Patients are able to view lab/test results, encounter notes, upcoming appointments, etc.  Non-urgent messages can be sent to your provider as well.   To learn more about what you can do with MyChart, go to [email or web address].    Your next appointment:   6 month(s)- Phys Pacer Check   Provider:   Thurmon Fair, MD

## 2022-05-19 ENCOUNTER — Other Ambulatory Visit: Payer: Self-pay

## 2022-05-19 DIAGNOSIS — K5730 Diverticulosis of large intestine without perforation or abscess without bleeding: Secondary | ICD-10-CM

## 2022-05-19 DIAGNOSIS — K2100 Gastro-esophageal reflux disease with esophagitis, without bleeding: Secondary | ICD-10-CM

## 2022-05-19 NOTE — Telephone Encounter (Signed)
[facility] has already scheduled patient for 6/6. FYI

## 2022-05-23 ENCOUNTER — Encounter (INDEPENDENT_AMBULATORY_CARE_PROVIDER_SITE_OTHER): Payer: Self-pay | Admitting: Ophthalmology

## 2022-05-23 ENCOUNTER — Encounter (INDEPENDENT_AMBULATORY_CARE_PROVIDER_SITE_OTHER): Payer: Medicare Other | Admitting: Ophthalmology

## 2022-05-23 ENCOUNTER — Ambulatory Visit (INDEPENDENT_AMBULATORY_CARE_PROVIDER_SITE_OTHER): Payer: Medicare (Managed Care) | Admitting: Ophthalmology

## 2022-05-23 DIAGNOSIS — H353124 Nonexudative age-related macular degeneration, left eye, advanced atrophic with subfoveal involvement: Secondary | ICD-10-CM | POA: Diagnosis not present

## 2022-05-23 DIAGNOSIS — Z961 Presence of intraocular lens: Secondary | ICD-10-CM

## 2022-05-23 DIAGNOSIS — H353211 Exudative age-related macular degeneration, right eye, with active choroidal neovascularization: Secondary | ICD-10-CM | POA: Diagnosis not present

## 2022-05-23 DIAGNOSIS — H35033 Hypertensive retinopathy, bilateral: Secondary | ICD-10-CM | POA: Diagnosis not present

## 2022-05-23 DIAGNOSIS — I10 Essential (primary) hypertension: Secondary | ICD-10-CM | POA: Diagnosis not present

## 2022-05-23 MED ORDER — BEVACIZUMAB CHEMO INJECTION 1.25MG/0.05ML SYRINGE FOR KALEIDOSCOPE
1.2500 mg | INTRAVITREAL | Status: AC | PRN
Start: 2022-05-23 — End: 2022-05-23
  Administered 2022-05-23: 1.25 mg via INTRAVITREAL

## 2022-06-13 ENCOUNTER — Telehealth: Payer: Self-pay

## 2022-06-13 NOTE — Telephone Encounter (Signed)
Called patients primary caregiver, [name], to go over the patients allergy to ct contrast and her upcoming appointment with [place] at [facility] for a CT scan with contrast. Per the patients daughter in law, the patient only developed a headache after receiving contrast in 2013. The patient has since had CT scans with contrast without any problems. She denied having rash, hives, difficulty breathing, etc... Therefor, the patient will not require a 13 hr prep.

## 2022-06-20 ENCOUNTER — Telehealth: Payer: Medicare (Managed Care) | Admitting: *Deleted

## 2022-06-20 NOTE — Telephone Encounter (Signed)
Kae Heller, NP called from [facility] stating that pt can't have CT done due to declining renal function. Please call when you get a chance with recommendations. [phone]

## 2022-06-22 ENCOUNTER — Inpatient Hospital Stay
Admission: RE | Admit: 2022-06-22 | Discharge: 2022-06-22 | Disposition: A | Discharge status: Home / Self Care | Payer: Medicare (Managed Care) | Source: Ambulatory Visit

## 2022-06-22 NOTE — Telephone Encounter (Signed)
Noted  

## 2022-06-22 NOTE — Telephone Encounter (Signed)
Spoke to [employer] the nutritionists at [employer], informed her of recommendations. She states she will put in a order to change to protein powder instead of boost. She states she was going to call the daughter-in-law to inform her.

## 2022-06-22 NOTE — Telephone Encounter (Signed)
I have sent notes to [facility] to see about getting done

## 2022-06-22 NOTE — Telephone Encounter (Signed)
LMOM for someone from [facility] to call me back.

## 2022-06-23 NOTE — Telephone Encounter (Signed)
Called [facility] and had to LM on [employer]

## 2022-06-28 NOTE — Telephone Encounter (Signed)
I have not heard back from [facility]. I called again and had to leave another message. I also have faxed notes again. FYI

## 2022-07-10 ENCOUNTER — Other Ambulatory Visit (HOSPITAL_BASED_OUTPATIENT_CLINIC_OR_DEPARTMENT_OTHER): Payer: Self-pay | Admitting: Family Medicine

## 2022-07-10 DIAGNOSIS — J302 Other seasonal allergic rhinitis: Secondary | ICD-10-CM

## 2022-07-10 DIAGNOSIS — E46 Unspecified protein-calorie malnutrition: Secondary | ICD-10-CM

## 2022-07-10 DIAGNOSIS — Z8673 Personal history of transient ischemic attack (TIA), and cerebral infarction without residual deficits: Secondary | ICD-10-CM

## 2022-07-14 ENCOUNTER — Other Ambulatory Visit (HOSPITAL_COMMUNITY): Payer: Medicare (Managed Care)

## 2022-07-18 ENCOUNTER — Ambulatory Visit (INDEPENDENT_AMBULATORY_CARE_PROVIDER_SITE_OTHER): Payer: Medicare (Managed Care) | Admitting: Ophthalmology

## 2022-07-18 ENCOUNTER — Encounter (INDEPENDENT_AMBULATORY_CARE_PROVIDER_SITE_OTHER): Payer: Self-pay | Admitting: Ophthalmology

## 2022-07-18 DIAGNOSIS — I10 Essential (primary) hypertension: Secondary | ICD-10-CM

## 2022-07-18 DIAGNOSIS — H353124 Nonexudative age-related macular degeneration, left eye, advanced atrophic with subfoveal involvement: Secondary | ICD-10-CM

## 2022-07-18 DIAGNOSIS — H353211 Exudative age-related macular degeneration, right eye, with active choroidal neovascularization: Secondary | ICD-10-CM | POA: Diagnosis not present

## 2022-07-18 DIAGNOSIS — Z961 Presence of intraocular lens: Secondary | ICD-10-CM

## 2022-07-18 DIAGNOSIS — H35033 Hypertensive retinopathy, bilateral: Secondary | ICD-10-CM | POA: Diagnosis not present

## 2022-07-18 MED ORDER — BEVACIZUMAB CHEMO INJECTION 1.25MG/0.05ML SYRINGE FOR KALEIDOSCOPE
1.2500 mg | INTRAVITREAL | Status: AC | PRN
Start: 2022-07-18 — End: 2022-07-18
  Administered 2022-07-18: 1.25 mg via INTRAVITREAL

## 2022-07-18 NOTE — Progress Notes (Deleted)
[facility] - Clinic Note  07/18/2022     CHIEF COMPLAINT Patient presents for Retina Follow Up   HISTORY OF PRESENT ILLNESS: [name] is a 89 y.o. female who presents to the clinic today for:   HPI     Retina Follow Up   Patient presents with  Wet AMD.  In right eye.  This started months ago.  Severity is moderate.  Duration of 8 weeks.  Since onset it is stable.  I, the attending physician,  performed the HPI with the patient and updated documentation appropriately.        Comments   Patient feels that the vision is about the same. She is using AT's OU PRN and Latanoprost OS at bedtime.       Last edited by Charlette Caffey, COT on 07/18/2022  9:12 AM.     Patient feels that the vision is about the same.   Referring physician: Jethro Bastos, MD [address] [place],  [place] [address]  HISTORICAL INFORMATION:   Selected notes from the MEDICAL RECORD NUMBER [name] -- transferring care due to insurance LEE: 09.07.23 -- BCVA CF OU Ocular Hx- ex ARMD OD -- s/p multiple IVA, last IVA OD 09.07.24 (4+ mos) PMH-    CURRENT MEDICATIONS: Current Outpatient Medications (Ophthalmic Drugs)  Medication Sig   carboxymethylcellulose (REFRESH PLUS) 0.5 % SOLN Place 1 drop into both eyes 3 (three) times daily as needed (dry/irritated eyes.). (Patient not taking: Reported on 05/18/2022)   latanoprost (XALATAN) 0.005 % ophthalmic solution Place 1 drop into the left eye at bedtime. (Patient not taking: Reported on 05/18/2022)   No current facility-administered medications for this visit. (Ophthalmic Drugs)   Current Outpatient Medications (Other)  Medication Sig   acetaminophen (TYLENOL) 500 MG tablet Take 1 tablet (500 mg total) by mouth every 6 (six) hours as needed for mild pain, fever or headache. (Patient not taking: Reported on 05/18/2022)   Alpha-Lipoic Acid 600 MG TABS Take 600 mg by mouth daily. For neuropathy in legs   amiodarone (PACERONE)  200 MG tablet Take 1 tablet (200 mg total) by mouth daily.   amLODipine (NORVASC) 2.5 MG tablet Take 1 tablet (2.5 mg total) by mouth daily. Do NOT take if BP drops below 130 on top or below 60 on bottom.   aspirin EC 81 MG EC tablet Take 1 tablet (81 mg total) by mouth daily. Swallow whole. (Patient not taking: Reported on 05/23/2022)   busPIRone (BUSPAR) 15 MG tablet TAKE ONE TABLET TWICE DAILY (Patient not taking: Reported on 05/18/2022)   Cholecalciferol (VITAMIN D) 50 MCG (2000 UT) CAPS Take 2,000 Units by mouth every morning. (Patient not taking: Reported on 05/18/2022)   ELIQUIS 2.5 MG TABS tablet TAKE (1) TABLET TWICE A DAY.   feeding supplement (ENSURE ENLIVE / ENSURE PLUS) LIQD Take 237 mLs by mouth 2 (two) times daily between meals. (Patient not taking: Reported on 05/18/2022)   furosemide (LASIX) 20 MG tablet Take 20 mg by mouth as needed. (Patient not taking: Reported on 05/18/2022)   gabapentin (NEURONTIN) 100 MG capsule TAKE UP TO 3 CAPSULES AT BEDTIME AS NEEDED   isosorbide mononitrate (IMDUR) 60 MG 24 hr tablet Take 1 tablet (60 mg total) by mouth daily.   levothyroxine (SYNTHROID) 100 MCG tablet TAKE ONE TABLET BY MOUTH EVERY MORNING   meclizine (ANTIVERT) 12.5 MG tablet Take 1 tablet (12.5 mg total) by mouth 3 (three) times daily as needed for  dizziness. (Patient not taking: Reported on 05/18/2022)   MELATONIN PO Take 3 mg by mouth at bedtime.   metoprolol succinate (TOPROL-XL) 25 MG 24 hr tablet Take 0.5 tablets (12.5 mg total) by mouth daily. Take with or immediately following a meal. (Patient taking differently: Take 12.5 mg by mouth 2 (two) times daily. Take with or immediately following a meal.)   metoprolol tartrate (LOPRESSOR) 25 MG tablet Take 25 mg by mouth daily. Once daily   Multiple Vitamins-Minerals (CENTRUM SILVER ULTRA WOMENS PO) Take 1 tablet by mouth every morning.   nitroGLYCERIN (NITROSTAT) 0.4 MG SL tablet PLACE 1 TABLET UNDER THE TONGUE AT ONSET OF CHEST PAIN EVERY 5  MINTUES UP TO 3 TIMES AS NEEDED   pantoprazole (PROTONIX) 40 MG tablet Take 1 tablet (40 mg total) by mouth 2 (two) times daily before a meal.   potassium chloride SA (KLOR-CON) 20 MEQ tablet Take 20 mEq by mouth 3 (three) times a week. (Patient not taking: Reported on 05/18/2022)   rosuvastatin (CRESTOR) 10 MG tablet TAKE 1 TABLET DAILY   simethicone (MYLICON) 125 MG chewable tablet Chew 125 mg by mouth every 6 (six) hours as needed for flatulence.   valsartan (DIOVAN) 40 MG tablet Take 0.5 tablets (20 mg total) by mouth daily as needed (For a systolic over 180). (Patient not taking: Reported on 05/18/2022)   venlafaxine (EFFEXOR) 37.5 MG tablet Take 37.5 mg by mouth daily. Once a day at noon.   No current facility-administered medications for this visit. (Other)   REVIEW OF SYSTEMS: ROS   Positive for: Cardiovascular, Eyes Negative for: Constitutional, Gastrointestinal, Neurological, Skin, Genitourinary, Musculoskeletal, HENT, Endocrine, Respiratory, Psychiatric, Allergic/Imm, Heme/Lymph Last edited by Charlette Caffey, COT on 07/18/2022  9:12 AM.      ALLERGIES Allergies  Allergen Reactions   Sotalol Other (See Comments)    Torsades    Tramadol Other (See Comments)    Hallucination, bad-dream   Actos [Pioglitazone] Swelling   Adhesive [Tape] Other (See Comments)    Causes sores   Atorvastatin Other (See Comments)    myalgia   Bactrim [Sulfamethoxazole-Trimethoprim] Itching and Rash    Itchy rash   Ciprofloxacin Nausea Only   Contrast Media [Iodinated Contrast Media] Other (See Comments)    Headache (no action/pre-med required)   Latex Rash   Pedi-Pre Tape Spray [Wound Dressing Adhesive] Other (See Comments)    Causes sores   Sulfa Antibiotics Other (See Comments)    Causes sores   PAST MEDICAL HISTORY Past Medical History:  Diagnosis Date   AICD (automatic cardioverter/defibrillator) present    Allergy    SESONAL   Aneurysm of infrarenal abdominal aorta (HCC)     Anxiety    ARTHRITIS    Arthritis    Atrial fibrillation (HCC)    CAD (coronary artery disease)    a. history of cardiac arrest 1993;  b. s/p LAD/LCX stenting in 2003;  c. 07/2011 Cath: LM nl, LAD patent stent, D1 80ost, LCX patent stent, RCA min irregs;  d. 04/2012 MV: EF 66%, no ishcemia;  e. 12/2013 Echo: Ef 55%, no rwma, Gr 1 DD, triv AI/MR, mildly dil LA;  f. 12/2013 Lexi MV: intermediate risk - apical ischemia and inf/infsept fixed defect, ? artifactual.   CAD in native artery 12/31/2013   Previous stents to LAD and Ramus patent on cath today 12/31/13 also There is severe disease in the ostial first diagonal which is unchanged from most recent cardiac catheterization. The right coronary artery could not be  engaged selectively but nonselective angiography showed no significant disease in the proximal and midsegment.       Cataract    DENIES   Chronic back pain    Chronic sinus bradycardia    CKD (chronic kidney disease), stage III (HCC)    Clotting disorder (HCC)    DVT   COLITIS 12/02/2007   Qualifier: Diagnosis of  By: Koleen Distance CMA Duncan Dull), Hulan Saas     Diabetes mellitus without complication (HCC)    DENIES   Diverticulosis of colon (without mention of hemorrhage) 2007   Colonoscopy    Esophageal stricture    a. 2012 s/p dil.   Esophagitis, unspecified    a. 2012 EGD   EXTERNAL HEMORRHOIDS    GERD (gastroesophageal reflux disease)    omeprazole   H/O hiatal hernia    Hiatal hernia    a. 2012 EGD.   History of DVT in the past, not on Coumadin now    left  leg   HYPERCHOLESTEROLEMIA    Hypertension    ICD (implantable cardiac defibrillator) in place    a. s/p initial ICD in 1993 in setting of cardiac arrest;  b. 01/2007 gen change: Guidant T135 Vitality DS VR single lead ICD.   Macular degeneration    gets injecton in eye every 5 weeks- last injection - 05/03/2013    Myocardial infarction [facility]) 1993   Near syncope 05/22/2015   Nephrolithiasis, just saw Dr Annabell Howells- "OK"     Orthostatic hypotension    Peripheral vascular disease (HCC)    ???   RECTAL BLEEDING 12/03/2007   Qualifier: Diagnosis of  By: Jarold Motto MD Mervyn Skeeters R    Unspecified gastritis and gastroduodenitis without mention of hemorrhage    a. 2003 EGD->not noted on 2012 EGD.   Unstable angina, neg MI, cath stable.maybe GI 12/30/2013   Past Surgical History:  Procedure Laterality Date   BREAST BIOPSY Left 11/2013   CARDIAC CATHETERIZATION     CARDIAC CATHETERIZATION  01/01/15   patent stents -there is severe disease in ostial 1st diag but without change.    CARDIAC DEFIBRILLATOR PLACEMENT  02/05/2007   Guidant   CARDIOVASCULAR STRESS TEST  12/30/2013   abnormal   CARDIOVERSION N/A 02/13/2019   Procedure: CARDIOVERSION;  Surgeon: Thurmon Fair, MD;  Location: [facility] ENDOS[facility];  Service: Cardiovascular;  Laterality: N/A;   CATARACT EXTRACTION Right    Dr. Dione Booze   COLONOSCOPY     COLONOSCOPY WITH PROPOFOL N/A 03/30/2020   Procedure: COLONOSCOPY WITH PROPOFOL;  Surgeon: Dolores Frame, MD;  Location: [facility];  Service: Gastroenterology;  Laterality: N/A;   coronary stents      CYSTOSCOPY WITH URETEROSCOPY Right 05/20/2013   Procedure: CYSTOSCOPY, RIGHT URETEROSCOPY STONE EXTRACTION, Insertion of right DOUBLE J STENT ;  Surgeon: Bjorn Pippin, MD;  Location: [facility] ORS;  Service: Urology;  Laterality: Right;   CYSTOSCOPY WITH URETEROSCOPY AND STENT PLACEMENT N/A 06/03/2013   Procedure: SECOND LOOK CYSTOSCOPY WITH URETEROSCOPY  HOLMIUM LASER LITHO AND STONE EXTRACTION Newman Nip ;  Surgeon: Anner Crete, MD;  Location: [facility] OR[facility];  Service: Urology;  Laterality: N/A;   ESOPHAGOGASTRODUODENOSCOPY (EGD) WITH PROPOFOL N/A 11/20/2019   Procedure: ESOPHAGOGASTRODUODENOSCOPY (EGD) WITH PROPOFOL;  Surgeon: Corbin Ade, MD;  Location: [facility];  Service: Endoscopy;  Laterality: N/A;   GIVENS CAPSULE STUDY N/A 10/30/2016   Procedure: GIVENS CAPSULE STUDY;  Surgeon: Hilarie Fredrickson, MD;  Location: [facility]  ENDOSCOPY;  Service: Endoscopy;  Laterality: N/A;  NEEDS TO BE  ADMITTED FOR OBSERVATION PT. HAS DEFIB   HEMORROIDECTOMY  80's   HOLMIUM LASER APPLICATION Right 05/20/2013   Procedure: HOLMIUM LASER APPLICATION;  Surgeon: Bjorn Pippin, MD;  Location: [facility] ORS;  Service: Urology;  Laterality: Right;   HYSTEROSCOPY WITH D & C  09/02/2010   Procedure: DILATATION AND CURETTAGE (D&C) /HYSTEROSCOPY;  Surgeon: Meriel Pica;  Location: [facility] ORS;  Service: Gynecology;  Laterality: N/A;  Dilation and Curettage with Hysteroscopy and Polypectomy   IMPLANTABLE CARDIOVERTER DEFIBRILLATOR (ICD) GENERATOR CHANGE N/A 02/10/2014   Procedure: ICD GENERATOR CHANGE;  Surgeon: Thurmon Fair, MD;  Location: [facility] CATH LAB;  Service: Cardiovascular;  Laterality: N/A;   LEFT HEART CATHETERIZATION WITH CORONARY ANGIOGRAM N/A 07/28/2011   Procedure: LEFT HEART CATHETERIZATION WITH CORONARY ANGIOGRAM;  Surgeon: Runell Gess, MD;  Location: [facility] CATH LAB;  Service: Cardiovascular;  Laterality: N/A;   LEFT HEART CATHETERIZATION WITH CORONARY ANGIOGRAM N/A 12/31/2013   Procedure: LEFT HEART CATHETERIZATION WITH CORONARY ANGIOGRAM;  Surgeon: Iran Ouch, MD;  Location: [facility] CATH LAB;  Service: Cardiovascular;  Laterality: N/A;   FAMILY HISTORY Family History  Problem Relation Age of Onset   Stroke Mother    Other Mother        brain tumor   Other Father        MI   Heart attack Father    Stroke Sister    Macular degeneration Sister    Diabetes Daughter    Cancer Daughter        ovarian   Colon polyps Daughter    Atrial fibrillation Sister    Hyperlipidemia Sister    Osteoporosis Sister    Stroke Sister    Uterine cancer Sister    Heart attack Brother    Heart disease Brother    Asthma Brother    Breast cancer Neg Hx    SOCIAL HISTORY Social History   Tobacco Use   Smoking status: Never   Smokeless tobacco: Never  Vaping Use   Vaping Use: Never used  Substance Use Topics   Alcohol use: No   Drug use: No        OPHTHALMIC EXAM:  Base Eye Exam     Visual Acuity (Snellen - Linear)       Right Left   Dist cc 20/100 +2 20/100 +2   Dist ph cc NI 20/80    Correction: Glasses         Tonometry (Tonopen, 9:15 AM)       Right Left   Pressure 14 20         Pupils       Dark Light Shape React APD   Right 3 2 Round Brisk None   Left 3 2 Round Brisk None         Visual Fields       Left Right    Full Full         Extraocular Movement       Right Left    Full, Ortho Full, Ortho         Neuro/Psych     Oriented x3: Yes   Mood/Affect: Normal         Dilation     Both eyes: 1.0% Mydriacyl, 2.5% Phenylephrine @ 9:13 AM           Slit Lamp and Fundus Exam     Slit Lamp Exam       Right Left   Lids/Lashes Dermatochalasis - upper lid Dermatochalasis - upper  lid   Conjunctiva/Sclera White and quiet White and quiet   Cornea 1-2+ Punctate epithelial erosions, well healed cataract wound 2+ fine Punctate epithelial erosions, well healed cataract wound   Anterior Chamber deep and clear deep and clear   Iris Round and dilated Round and dilated   Lens PC IOL in good position PC IOL in good position   Anterior Vitreous mild syneresis, Posterior vitreous detachment, Vitreous condensations mild syneresis, silicone oil microbubbles, PVD         Fundus Exam       Right Left   Disc mild Pallor, Sharp rim, temporal PPA mild Pallor, Sharp rim, temporal PPA   C/D Ratio 0.1 0.1   Macula Blunted foveal reflex, central GA with punctate heme superior macula--improved, Drusen, RPE mottling and clumping, +CNV Blunted foveal reflex, large area of GA centrally, Drusen, RPE mottling, clumping and atrophy, no heme   Vessels attenuated, Tortuous attenuated, mild tortuosity   Periphery Attached, reticular degeneration, No heme Attached, reticular degeneration, No heme           Refraction     Wearing Rx       Sphere Cylinder Axis Add   Right -1.00 +1.25 160 +2.25    Left -1.00 +0.75 150 +2.25           IMAGING AND PROCEDURES  Imaging and Procedures for 07/18/2022           ASSESSMENT/PLAN:    ICD-10-CM   1. Exudative age-related macular degeneration of right eye with active choroidal neovascularization (HCC)  H35.3211     2. Advanced atrophic nonexudative age-related macular degeneration of left eye with subfoveal involvement  H35.3124     3. Essential hypertension  I10     4. Hypertensive retinopathy of both eyes  H35.033     5. Pseudophakia, both eyes  Z96.1       Exudative age related macular degeneration, right eye - previously managed by Dr. Luciana Axe - last visit and injection was 09.07.24 (IVA OD) -- pt was on a q6-8 wk maintenance schedule -- presented at 4+ mos since last IVA OD w/ [facility] - s/p IVA OD #1 (01.30.24), #2 (03.12.24), #3 (05.07.24)  - BCVA OD 20/100 stable - OCT OD: Central ORA with scattered PED / SRHM--stable, trace cystic changes temp fovea at 8 wks  - exam shows punctate IRH centrally,  trace cystic changes temp fovea - recommend IVA OD #4 today, 07.02.24 for punctate IRH and maintenance tx w/ f/u in 8 wks again  - pt wishes to proceed with injection  - RBA of procedure discussed, questions answered - IVA informed consent obtained and signed 01.30.24 - see procedure note  - f/u in 8 wks, DFE, OCT, possible injxn  2. Age related macular degeneration, non-exudative, left eye  - advanced stage with large central area of GA  - BCVA 20/80 from 20/100 - The incidence, anatomy, and pathology of dry AMD, risk of progression, and the AREDS and AREDS 2 study including smoking risks discussed with patient.  - Recommend amsler grid monitoring  3,4. Hypertensive retinopathy OU - discussed importance of tight BP control - continue to monitor  5. Pseudophakia OU  - s/p CE/IOL OU (Dr. Zetta Bills)  - IOLs in good position, doing well  - continue to monitor  Ophthalmic Meds Ordered this visit:  No orders of the  defined types were placed in this encounter.    No follow-ups on file.  There are no Patient Instructions on file  for this visit.   Explained the diagnoses, plan, and follow up with the patient and they expressed understanding.  Patient expressed understanding of the importance of proper follow up care.   This document serves as a record of services personally performed by Karie Chimera, MD, PhD. It was created on their behalf by Gerilyn Nestle, COT an ophthalmic technician. The creation of this record is the provider's dictation and/or activities during the visit.    Electronically signed by:  Charlette Caffey, COT  07/18/22 11:03 AM   Karie Chimera, M.D., Ph.D. Diseases & Surgery of the Retina and Vitreous [facility]    Abbreviations: M myopia (nearsighted); A astigmatism; H hyperopia (farsighted); P presbyopia; Mrx spectacle prescription;  CTL contact lenses; OD right eye; OS left eye; OU both eyes  XT exotropia; ET esotropia; PEK punctate epithelial keratitis; PEE punctate epithelial erosions; DES dry eye syndrome; MGD meibomian gland dysfunction; ATs artificial tears; PFAT's preservative free artificial tears; NSC nuclear sclerotic cataract; PSC posterior subcapsular cataract; ERM epi-retinal membrane; PVD posterior vitreous detachment; RD retinal detachment; DM diabetes mellitus; DR diabetic retinopathy; NPDR non-proliferative diabetic retinopathy; PDR proliferative diabetic retinopathy; CSME clinically significant macular edema; DME diabetic macular edema; dbh dot blot hemorrhages; CWS cotton wool spot; POAG primary open angle glaucoma; C/D cup-to-disc ratio; HVF humphrey visual field; GVF goldmann visual field; OCT optical coherence tomography; IOP intraocular pressure; BRVO Branch retinal vein occlusion; CRVO central retinal vein occlusion; CRAO central retinal artery occlusion; BRAO branch retinal artery occlusion; RT retinal tear; SB scleral buckle;  PPV pars plana vitrectomy; VH Vitreous hemorrhage; PRP panretinal laser photocoagulation; IVK intravitreal kenalog; VMT vitreomacular traction; MH Macular hole;  NVD neovascularization of the disc; NVE neovascularization elsewhere; AREDS age related eye disease study; ARMD age related macular degeneration; POAG primary open angle glaucoma; EBMD epithelial/anterior basement membrane dystrophy; ACIOL anterior chamber intraocular lens; IOL intraocular lens; PCIOL posterior chamber intraocular lens; Phaco/IOL phacoemulsification with intraocular lens placement; PRK photorefractive keratectomy; LASIK laser assisted in situ keratomileusis; HTN hypertension; DM diabetes mellitus; COPD chronic obstructive pulmonary disease

## 2022-07-18 NOTE — Progress Notes (Signed)
[facility] - Clinic Note  07/18/2022     CHIEF COMPLAINT Patient presents for Retina Follow Up   HISTORY OF PRESENT ILLNESS: [name] is a 89 y.o. female who presents to the clinic today for:   HPI     Retina Follow Up   Patient presents with  Wet AMD.  In right eye.  This started months ago.  Severity is moderate.  Duration of 8 weeks.  Since onset it is stable.  I, the attending physician,  performed the HPI with the patient and updated documentation appropriately.        Comments   Patient feels that the vision is about the same. She is using AT's OU PRN and Latanoprost OS at bedtime.       Last edited by Rennis Chris, MD on 07/18/2022 12:26 PM.     Patient feels that the vision is about the same.   Referring physician: Jethro Bastos, MD [address] [place],  [place] [address]  HISTORICAL INFORMATION:   Selected notes from the MEDICAL RECORD NUMBER [name] -- transferring care due to insurance LEE: 09.07.23 -- BCVA CF OU Ocular Hx- ex ARMD OD -- s/p multiple IVA, last IVA OD 09.07.24 (4+ mos) PMH-    CURRENT MEDICATIONS: Current Outpatient Medications (Ophthalmic Drugs)  Medication Sig   carboxymethylcellulose (REFRESH PLUS) 0.5 % SOLN Place 1 drop into both eyes 3 (three) times daily as needed (dry/irritated eyes.). (Patient not taking: Reported on 05/18/2022)   latanoprost (XALATAN) 0.005 % ophthalmic solution Place 1 drop into the left eye at bedtime. (Patient not taking: Reported on 05/18/2022)   No current facility-administered medications for this visit. (Ophthalmic Drugs)   Current Outpatient Medications (Other)  Medication Sig   acetaminophen (TYLENOL) 500 MG tablet Take 1 tablet (500 mg total) by mouth every 6 (six) hours as needed for mild pain, fever or headache. (Patient not taking: Reported on 05/18/2022)   Alpha-Lipoic Acid 600 MG TABS Take 600 mg by mouth daily. For neuropathy in legs   amiodarone (PACERONE) 200 MG  tablet Take 1 tablet (200 mg total) by mouth daily.   amLODipine (NORVASC) 2.5 MG tablet Take 1 tablet (2.5 mg total) by mouth daily. Do NOT take if BP drops below 130 on top or below 60 on bottom.   aspirin EC 81 MG EC tablet Take 1 tablet (81 mg total) by mouth daily. Swallow whole. (Patient not taking: Reported on 05/23/2022)   busPIRone (BUSPAR) 15 MG tablet TAKE ONE TABLET TWICE DAILY (Patient not taking: Reported on 05/18/2022)   Cholecalciferol (VITAMIN D) 50 MCG (2000 UT) CAPS Take 2,000 Units by mouth every morning. (Patient not taking: Reported on 05/18/2022)   ELIQUIS 2.5 MG TABS tablet TAKE (1) TABLET TWICE A DAY.   feeding supplement (ENSURE ENLIVE / ENSURE PLUS) LIQD Take 237 mLs by mouth 2 (two) times daily between meals. (Patient not taking: Reported on 05/18/2022)   furosemide (LASIX) 20 MG tablet Take 20 mg by mouth as needed. (Patient not taking: Reported on 05/18/2022)   gabapentin (NEURONTIN) 100 MG capsule TAKE UP TO 3 CAPSULES AT BEDTIME AS NEEDED   isosorbide mononitrate (IMDUR) 60 MG 24 hr tablet Take 1 tablet (60 mg total) by mouth daily.   levothyroxine (SYNTHROID) 100 MCG tablet TAKE ONE TABLET BY MOUTH EVERY MORNING   meclizine (ANTIVERT) 12.5 MG tablet Take 1 tablet (12.5 mg total) by mouth 3 (three) times daily as needed for dizziness. (Patient  not taking: Reported on 05/18/2022)   MELATONIN PO Take 3 mg by mouth at bedtime.   metoprolol succinate (TOPROL-XL) 25 MG 24 hr tablet Take 0.5 tablets (12.5 mg total) by mouth daily. Take with or immediately following a meal. (Patient taking differently: Take 12.5 mg by mouth 2 (two) times daily. Take with or immediately following a meal.)   metoprolol tartrate (LOPRESSOR) 25 MG tablet Take 25 mg by mouth daily. Once daily   Multiple Vitamins-Minerals (CENTRUM SILVER ULTRA WOMENS PO) Take 1 tablet by mouth every morning.   nitroGLYCERIN (NITROSTAT) 0.4 MG SL tablet PLACE 1 TABLET UNDER THE TONGUE AT ONSET OF CHEST PAIN EVERY 5 MINTUES UP  TO 3 TIMES AS NEEDED   pantoprazole (PROTONIX) 40 MG tablet Take 1 tablet (40 mg total) by mouth 2 (two) times daily before a meal.   potassium chloride SA (KLOR-CON) 20 MEQ tablet Take 20 mEq by mouth 3 (three) times a week. (Patient not taking: Reported on 05/18/2022)   rosuvastatin (CRESTOR) 10 MG tablet TAKE 1 TABLET DAILY   simethicone (MYLICON) 125 MG chewable tablet Chew 125 mg by mouth every 6 (six) hours as needed for flatulence.   valsartan (DIOVAN) 40 MG tablet Take 0.5 tablets (20 mg total) by mouth daily as needed (For a systolic over 180). (Patient not taking: Reported on 05/18/2022)   venlafaxine (EFFEXOR) 37.5 MG tablet Take 37.5 mg by mouth daily. Once a day at noon.   No current facility-administered medications for this visit. (Other)   REVIEW OF SYSTEMS: ROS   Positive for: Cardiovascular, Eyes Negative for: Constitutional, Gastrointestinal, Neurological, Skin, Genitourinary, Musculoskeletal, HENT, Endocrine, Respiratory, Psychiatric, Allergic/Imm, Heme/Lymph Last edited by Charlette Caffey, COT on 07/18/2022  9:12 AM.      ALLERGIES Allergies  Allergen Reactions   Sotalol Other (See Comments)    Torsades    Tramadol Other (See Comments)    Hallucination, bad-dream   Actos [Pioglitazone] Swelling   Adhesive [Tape] Other (See Comments)    Causes sores   Atorvastatin Other (See Comments)    myalgia   Bactrim [Sulfamethoxazole-Trimethoprim] Itching and Rash    Itchy rash   Ciprofloxacin Nausea Only   Contrast Media [Iodinated Contrast Media] Other (See Comments)    Headache (no action/pre-med required)   Latex Rash   Pedi-Pre Tape Spray [Wound Dressing Adhesive] Other (See Comments)    Causes sores   Sulfa Antibiotics Other (See Comments)    Causes sores   PAST MEDICAL HISTORY Past Medical History:  Diagnosis Date   AICD (automatic cardioverter/defibrillator) present    Allergy    SESONAL   Aneurysm of infrarenal abdominal aorta (HCC)    Anxiety     ARTHRITIS    Arthritis    Atrial fibrillation (HCC)    CAD (coronary artery disease)    a. history of cardiac arrest 1993;  b. s/p LAD/LCX stenting in 2003;  c. 07/2011 Cath: LM nl, LAD patent stent, D1 80ost, LCX patent stent, RCA min irregs;  d. 04/2012 MV: EF 66%, no ishcemia;  e. 12/2013 Echo: Ef 55%, no rwma, Gr 1 DD, triv AI/MR, mildly dil LA;  f. 12/2013 Lexi MV: intermediate risk - apical ischemia and inf/infsept fixed defect, ? artifactual.   CAD in native artery 12/31/2013   Previous stents to LAD and Ramus patent on cath today 12/31/13 also There is severe disease in the ostial first diagonal which is unchanged from most recent cardiac catheterization. The right coronary artery could not be engaged selectively  but nonselective angiography showed no significant disease in the proximal and midsegment.       Cataract    DENIES   Chronic back pain    Chronic sinus bradycardia    CKD (chronic kidney disease), stage III (HCC)    Clotting disorder (HCC)    DVT   COLITIS 12/02/2007   Qualifier: Diagnosis of  By: Koleen Distance CMA Duncan Dull), Hulan Saas     Diabetes mellitus without complication (HCC)    DENIES   Diverticulosis of colon (without mention of hemorrhage) 2007   Colonoscopy    Esophageal stricture    a. 2012 s/p dil.   Esophagitis, unspecified    a. 2012 EGD   EXTERNAL HEMORRHOIDS    GERD (gastroesophageal reflux disease)    omeprazole   H/O hiatal hernia    Hiatal hernia    a. 2012 EGD.   History of DVT in the past, not on Coumadin now    left  leg   HYPERCHOLESTEROLEMIA    Hypertension    ICD (implantable cardiac defibrillator) in place    a. s/p initial ICD in 1993 in setting of cardiac arrest;  b. 01/2007 gen change: Guidant T135 Vitality DS VR single lead ICD.   Macular degeneration    gets injecton in eye every 5 weeks- last injection - 05/03/2013    Myocardial infarction [facility]) 1993   Near syncope 05/22/2015   Nephrolithiasis, just saw Dr Annabell Howells- "OK"    Orthostatic  hypotension    Peripheral vascular disease (HCC)    ???   RECTAL BLEEDING 12/03/2007   Qualifier: Diagnosis of  By: Jarold Motto MD Mervyn Skeeters R    Unspecified gastritis and gastroduodenitis without mention of hemorrhage    a. 2003 EGD->not noted on 2012 EGD.   Unstable angina, neg MI, cath stable.maybe GI 12/30/2013   Past Surgical History:  Procedure Laterality Date   BREAST BIOPSY Left 11/2013   CARDIAC CATHETERIZATION     CARDIAC CATHETERIZATION  01/01/15   patent stents -there is severe disease in ostial 1st diag but without change.    CARDIAC DEFIBRILLATOR PLACEMENT  02/05/2007   Guidant   CARDIOVASCULAR STRESS TEST  12/30/2013   abnormal   CARDIOVERSION N/A 02/13/2019   Procedure: CARDIOVERSION;  Surgeon: Thurmon Fair, MD;  Location: [facility] ENDOSCOPY;  Service: Cardiovascular;  Laterality: N/A;   CATARACT EXTRACTION Right    Dr. Dione Booze   COLONOSCOPY     COLONOSCOPY WITH PROPOFOL N/A 03/30/2020   Procedure: COLONOSCOPY WITH PROPOFOL;  Surgeon: Dolores Frame, MD;  Location: [facility];  Service: Gastroenterology;  Laterality: N/A;   coronary stents      CYSTOSCOPY WITH URETEROSCOPY Right 05/20/2013   Procedure: CYSTOSCOPY, RIGHT URETEROSCOPY STONE EXTRACTION, Insertion of right DOUBLE J STENT ;  Surgeon: Bjorn Pippin, MD;  Location: [facility] ORS;  Service: Urology;  Laterality: Right;   CYSTOSCOPY WITH URETEROSCOPY AND STENT PLACEMENT N/A 06/03/2013   Procedure: SECOND LOOK CYSTOSCOPY WITH URETEROSCOPY  HOLMIUM LASER LITHO AND STONE EXTRACTION Newman Nip ;  Surgeon: Anner Crete, MD;  Location: [facility] ORS;  Service: Urology;  Laterality: N/A;   ESOPHAGOGASTRODUODENOSCOPY (EGD) WITH PROPOFOL N/A 11/20/2019   Procedure: ESOPHAGOGASTRODUODENOSCOPY (EGD) WITH PROPOFOL;  Surgeon: Corbin Ade, MD;  Location: [facility];  Service: Endoscopy;  Laterality: N/A;   GIVENS CAPSULE STUDY N/A 10/30/2016   Procedure: GIVENS CAPSULE STUDY;  Surgeon: Hilarie Fredrickson, MD;  Location: [facility] ENDOSCOPY;   Service: Endoscopy;  Laterality: N/A;  NEEDS TO BE ADMITTED FOR  OBSERVATION PT. HAS DEFIB   HEMORROIDECTOMY  80's   HOLMIUM LASER APPLICATION Right 05/20/2013   Procedure: HOLMIUM LASER APPLICATION;  Surgeon: Bjorn Pippin, MD;  Location: [facility]S;  Service: Urology;  Laterality: Right;   HYSTEROSCOPY WITH D & C  09/02/2010   Procedure: DILATATION AND CURETTAGE (D&C) /HYSTEROSCOPY;  Surgeon: Meriel Pica;  Location: [facility] ORS;  Service: Gynecology;  Laterality: N/A;  Dilation and Curettage with Hysteroscopy and Polypectomy   IMPLANTABLE CARDIOVERTER DEFIBRILLATOR (ICD) GENERATOR CHANGE N/A 02/10/2014   Procedure: ICD GENERATOR CHANGE;  Surgeon: Thurmon Fair, MD;  Location: [facility] CATH LAB;  Service: Cardiovascular;  Laterality: N/A;   LEFT HEART CATHETERIZATION WITH CORONARY ANGIOGRAM N/A 07/28/2011   Procedure: LEFT HEART CATHETERIZATION WITH CORONARY ANGIOGRAM;  Surgeon: Runell Gess, MD;  Location: [facility] CATH LAB;  Service: Cardiovascular;  Laterality: N/A;   LEFT HEART CATHETERIZATION WITH CORONARY ANGIOGRAM N/A 12/31/2013   Procedure: LEFT HEART CATHETERIZATION WITH CORONARY ANGIOGRAM;  Surgeon: Iran Ouch, MD;  Location: [facility] CATH LAB;  Service: Cardiovascular;  Laterality: N/A;   FAMILY HISTORY Family History  Problem Relation Age of Onset   Stroke Mother    Other Mother        brain tumor   Other Father        MI   Heart attack Father    Stroke Sister    Macular degeneration Sister    Diabetes Daughter    Cancer Daughter        ovarian   Colon polyps Daughter    Atrial fibrillation Sister    Hyperlipidemia Sister    Osteoporosis Sister    Stroke Sister    Uterine cancer Sister    Heart attack Brother    Heart disease Brother    Asthma Brother    Breast cancer Neg Hx    SOCIAL HISTORY Social History   Tobacco Use   Smoking status: Never   Smokeless tobacco: Never  Vaping Use   Vaping Use: Never used  Substance Use Topics   Alcohol use: No   Drug use: No        OPHTHALMIC EXAM:  Base Eye Exam     Visual Acuity (Snellen - Linear)       Right Left   Dist cc 20/100 +2 20/100 +2   Dist ph cc NI 20/80    Correction: Glasses         Tonometry (Tonopen, 9:15 AM)       Right Left   Pressure 14 20         Pupils       Dark Light Shape React APD   Right 3 2 Round Brisk None   Left 3 2 Round Brisk None         Visual Fields       Left Right    Full Full         Extraocular Movement       Right Left    Full, Ortho Full, Ortho         Neuro/Psych     Oriented x3: Yes   Mood/Affect: Normal         Dilation     Both eyes: 1.0% Mydriacyl, 2.5% Phenylephrine @ 9:13 AM           Slit Lamp and Fundus Exam     Slit Lamp Exam       Right Left   Lids/Lashes Dermatochalasis - upper lid Dermatochalasis - upper lid  Conjunctiva/Sclera White and quiet White and quiet   Cornea 1-2+ Punctate epithelial erosions, well healed cataract wound 2+ fine Punctate epithelial erosions, well healed cataract wound   Anterior Chamber deep and clear deep and clear   Iris Round and dilated Round and dilated   Lens PC IOL in good position PC IOL in good position   Anterior Vitreous mild syneresis, Posterior vitreous detachment, Vitreous condensations mild syneresis, silicone oil microbubbles, PVD         Fundus Exam       Right Left   Disc mild Pallor, Sharp rim, temporal PPA mild Pallor, Sharp rim, temporal PPA   C/D Ratio 0.1 0.1   Macula Blunted foveal reflex, central GA with punctate heme superior macula--improved, Drusen, RPE mottling and clumping, +CNV Blunted foveal reflex, large area of GA centrally, Drusen, RPE mottling, clumping and atrophy, no heme   Vessels attenuated, Tortuous attenuated, mild tortuosity   Periphery Attached, reticular degeneration, No heme Attached, reticular degeneration, No heme           Refraction     Wearing Rx       Sphere Cylinder Axis Add   Right -1.00 +1.25 160 +2.25   Left  -1.00 +0.75 150 +2.25           IMAGING AND PROCEDURES  Imaging and Procedures for 07/18/2022  OCT, Retina - OU - Both Eyes       Right Eye Quality was good. Central Foveal Thickness: 298. Progression has been stable. Findings include normal foveal contour, no IRF, no SRF, retinal drusen , subretinal hyper-reflective material, pigment epithelial detachment, outer retinal atrophy (Central ORA with scattered PED / SRHM--stable, trace cystic changes temp fovea -- stably improved).   Left Eye Quality was good. Central Foveal Thickness: 272. Progression has been stable. Findings include normal foveal contour, no IRF, no SRF, retinal drusen , subretinal hyper-reflective material, outer retinal atrophy (Central ORA with [facility]).   Notes *Images captured and stored on drive  Diagnosis / Impression:  OD: Exudative ARMD - Central ORA with scattered PED / SRHM--stable, trace cystic changes temp fovea -- stably improved OS: Nonexudative ARMD - Central ORA with [facility]  Clinical management:  See below  Abbreviations: NFP - Normal foveal profile. CME - cystoid macular edema. PED - pigment epithelial detachment. IRF - intraretinal fluid. SRF - subretinal fluid. EZ - ellipsoid zone. ERM - epiretinal membrane. ORA - outer retinal atrophy. ORT - outer retinal tubulation. SRHM - subretinal hyper-reflective material. IRHM - intraretinal hyper-reflective material      Intravitreal Injection, Pharmacologic Agent - OD - Right Eye       Time Out 07/18/2022. 11:10 AM. Confirmed correct patient, procedure, site, and patient consented.   Anesthesia Topical anesthesia was used. Anesthetic medications included Lidocaine 2%, Proparacaine 0.5%.   Procedure Preparation included 5% betadine to ocular surface, eyelid speculum. A (32g) needle was used.   Injection: 1.25 mg Bevacizumab 1.25mg /0.42ml   Route: Intravitreal, Site: Right Eye   NDC: [id number], Lot: [id number], Expiration date: 10/13/2022    Post-op Post injection exam found visual acuity of at least counting fingers. The patient tolerated the procedure well. There were no complications. The patient received written and verbal post procedure care education. Post injection medications were not given.            ASSESSMENT/PLAN:    ICD-10-CM   1. Exudative age-related macular degeneration of right eye with active choroidal neovascularization (HCC)  H35.3211 OCT, Retina - OU - Both Eyes  Intravitreal Injection, Pharmacologic Agent - OD - Right Eye    Bevacizumab (AVASTIN) SOLN 1.25 mg    2. Advanced atrophic nonexudative age-related macular degeneration of left eye with subfoveal involvement  H35.3124     3. Essential hypertension  I10     4. Hypertensive retinopathy of both eyes  H35.033     5. Pseudophakia, both eyes  Z96.1      Exudative age related macular degeneration, right eye - previously managed by Dr. Luciana Axe - last visit and injection was 09.07.24 (IVA OD) -- pt was on a q6-8 wk maintenance schedule -- presented at 4+ mos since last IVA OD w/ [facility] - s/p IVA OD #1 (01.30.24), #2 (03.12.24), #3 (05.07.24)  - BCVA OD stable at 20/100   - OCT OD: Central ORA with scattered PED / SRHM--stable, trace cystic changes temp fovea -- stably improved at 8 wks  - exam shows punctate IRH centrally, trace cystic changes temp fovea - recommend IVA OD #4 today, 07.02.24 for punctate IRH and maintenance tx w/ f/u in 8 wks again  - pt wishes to proceed with injection  - RBA of procedure discussed, questions answered - IVA informed consent obtained and signed 01.30.24 - see procedure note  - f/u in 8 wks, DFE, OCT, possible injxn  2. Age related macular degeneration, non-exudative, left eye  - advanced stage with large central area of GA  - BCVA stable at 20/80  - The incidence, anatomy, and pathology of dry AMD, risk of progression, and the AREDS and AREDS 2 study including smoking risks discussed with patient.  -  Recommend amsler grid monitoring  3,4. Hypertensive retinopathy OU - discussed importance of tight BP control - continue to monitor  5. Pseudophakia OU  - s/p CE/IOL OU (Dr. Zetta Bills)  - IOLs in good position, doing well  - continue to monitor  Ophthalmic Meds Ordered this visit:  Meds ordered this encounter  Medications   Bevacizumab (AVASTIN) SOLN 1.25 mg     Return in about 8 weeks (around 09/12/2022) for f/u exu ARMD OD, DFE, OCT.  There are no Patient Instructions on file for this visit.   Explained the diagnoses, plan, and follow up with the patient and they expressed understanding.  Patient expressed understanding of the importance of proper follow up care.   This document serves as a record of services personally performed by Karie Chimera, MD, PhD. It was created on their behalf by Gerilyn Nestle, COT an ophthalmic technician. The creation of this record is the provider's dictation and/or activities during the visit.    Electronically signed by:  Gerilyn Nestle, COT  05.07.24 12:56 PM  Karie Chimera, M.D., Ph.D. Diseases & Surgery of the Retina and Vitreous [facility]  I have reviewed the above documentation for accuracy and completeness, and I agree with the above. Karie Chimera, M.D., Ph.D. 07/18/22 12:56 PM  Abbreviations: M myopia (nearsighted); A astigmatism; H hyperopia (farsighted); P presbyopia; Mrx spectacle prescription;  CTL contact lenses; OD right eye; OS left eye; OU both eyes  XT exotropia; ET esotropia; PEK punctate epithelial keratitis; PEE punctate epithelial erosions; DES dry eye syndrome; MGD meibomian gland dysfunction; ATs artificial tears; PFAT's preservative free artificial tears; NSC nuclear sclerotic cataract; PSC posterior subcapsular cataract; ERM epi-retinal membrane; PVD posterior vitreous detachment; RD retinal detachment; DM diabetes mellitus; DR diabetic retinopathy; NPDR non-proliferative diabetic  retinopathy; PDR proliferative diabetic retinopathy; CSME clinically significant macular edema; DME diabetic macular  edema; dbh dot blot hemorrhages; CWS cotton wool spot; POAG primary open angle glaucoma; C/D cup-to-disc ratio; HVF humphrey visual field; GVF goldmann visual field; OCT optical coherence tomography; IOP intraocular pressure; BRVO Branch retinal vein occlusion; CRVO central retinal vein occlusion; CRAO central retinal artery occlusion; BRAO branch retinal artery occlusion; RT retinal tear; SB scleral buckle; PPV pars plana vitrectomy; VH Vitreous hemorrhage; PRP panretinal laser photocoagulation; IVK intravitreal kenalog; VMT vitreomacular traction; MH Macular hole;  NVD neovascularization of the disc; NVE neovascularization elsewhere; AREDS age related eye disease study; ARMD age related macular degeneration; POAG primary open angle glaucoma; EBMD epithelial/anterior basement membrane dystrophy; ACIOL anterior chamber intraocular lens; IOL intraocular lens; PCIOL posterior chamber intraocular lens; Phaco/IOL phacoemulsification with intraocular lens placement; [place] photorefractive keratectomy; LASIK laser assisted in situ keratomileusis; HTN hypertension; DM diabetes mellitus; COPD chronic obstructive pulmonary disease

## 2022-07-24 ENCOUNTER — Ambulatory Visit (HOSPITAL_COMMUNITY)
Admission: RE | Admit: 2022-07-24 | Discharge: 2022-07-24 | Disposition: A | Discharge status: Home / Self Care | Payer: Medicare (Managed Care) | Source: Ambulatory Visit | Attending: Family Medicine | Admitting: Family Medicine

## 2022-07-24 ENCOUNTER — Encounter (HOSPITAL_COMMUNITY): Payer: Self-pay

## 2022-07-24 DIAGNOSIS — Z8673 Personal history of transient ischemic attack (TIA), and cerebral infarction without residual deficits: Secondary | ICD-10-CM

## 2022-07-24 DIAGNOSIS — E46 Unspecified protein-calorie malnutrition: Secondary | ICD-10-CM

## 2022-07-24 DIAGNOSIS — J302 Other seasonal allergic rhinitis: Secondary | ICD-10-CM

## 2022-07-25 ENCOUNTER — Ambulatory Visit (INDEPENDENT_AMBULATORY_CARE_PROVIDER_SITE_OTHER): Payer: Medicare (Managed Care) | Admitting: Podiatry

## 2022-07-25 ENCOUNTER — Encounter: Payer: Self-pay | Admitting: Podiatry

## 2022-07-25 DIAGNOSIS — Z7901 Long term (current) use of anticoagulants: Secondary | ICD-10-CM

## 2022-07-25 DIAGNOSIS — L84 Corns and callosities: Secondary | ICD-10-CM

## 2022-07-26 ENCOUNTER — Ambulatory Visit (HOSPITAL_COMMUNITY)
Admission: RE | Admit: 2022-07-26 | Discharge: 2022-07-26 | Disposition: A | Discharge status: Home / Self Care | Payer: Medicare (Managed Care) | Source: Ambulatory Visit | Attending: Family Medicine | Admitting: Family Medicine

## 2022-07-26 DIAGNOSIS — E46 Unspecified protein-calorie malnutrition: Secondary | ICD-10-CM | POA: Diagnosis present

## 2022-07-26 DIAGNOSIS — Z8673 Personal history of transient ischemic attack (TIA), and cerebral infarction without residual deficits: Secondary | ICD-10-CM | POA: Insufficient documentation

## 2022-07-26 DIAGNOSIS — J302 Other seasonal allergic rhinitis: Secondary | ICD-10-CM | POA: Diagnosis present

## 2022-07-31 NOTE — Progress Notes (Unsigned)
Subjective: Chief Complaint  Patient presents with   Foot Ulcer    Follow up ulcer tip of hallux left   "This place makes my whole toe hurt sometimes"    Denies any systemic complaints such as fevers, chills, nausea, vomiting. No acute changes since last appointment, and no other complaints at this time.   Objective: AAO x3, NAD DP/PT pulses palpable bilaterally, CRT less than 3 seconds Protective sensation intact with Simms Weinstein monofilament, vibratory sensation intact, Achilles tendon reflex intact No areas of pinpoint bony tenderness or pain with vibratory sensation. MMT 5/5, ROM WNL. No edema, erythema, increase in warmth to bilateral lower extremities.  No open lesions or pre-ulcerative lesions.  No pain with calf compression, swelling, warmth, erythema  Assessment:  Plan: -All treatment options discussed with the patient including all alternatives, risks, complications.   -Patient encouraged to call the office with any questions, concerns, change in symptoms.

## 2022-08-30 ENCOUNTER — Ambulatory Visit (HOSPITAL_COMMUNITY)
Admission: RE | Admit: 2022-08-30 | Discharge: 2022-08-30 | Disposition: A | Discharge status: Home / Self Care | Payer: Medicare (Managed Care) | Source: Ambulatory Visit | Attending: Internal Medicine | Admitting: Internal Medicine

## 2022-08-30 ENCOUNTER — Ambulatory Visit: Payer: Medicare (Managed Care) | Admitting: Internal Medicine

## 2022-08-30 ENCOUNTER — Encounter: Payer: Self-pay | Admitting: Internal Medicine

## 2022-08-30 VITALS — BP 104/50 | HR 54 | Temp 98.0°F | Ht 63.0 in | Wt 115.3 lb

## 2022-08-30 DIAGNOSIS — R1033 Periumbilical pain: Secondary | ICD-10-CM

## 2022-08-30 DIAGNOSIS — R634 Abnormal weight loss: Secondary | ICD-10-CM

## 2022-08-30 DIAGNOSIS — R198 Other specified symptoms and signs involving the digestive system and abdomen: Secondary | ICD-10-CM | POA: Diagnosis present

## 2022-08-30 DIAGNOSIS — R140 Abdominal distension (gaseous): Secondary | ICD-10-CM

## 2022-08-30 DIAGNOSIS — K219 Gastro-esophageal reflux disease without esophagitis: Secondary | ICD-10-CM | POA: Diagnosis not present

## 2022-08-30 DIAGNOSIS — R630 Anorexia: Secondary | ICD-10-CM | POA: Diagnosis not present

## 2022-08-30 NOTE — Patient Instructions (Signed)
I am going to order an x-ray of your abdomen today.  We will call with results.  We may need to get more aggressive about getting your bowels moving, you may have a condition called overflow diarrhea in the setting of constipation.  Would recommend trial of over-the-counter stool softeners. I want you to start taking over the counter MiraLAX 1 capful daily.  If this does not adequately control your constipation, I would increase to 2 capfuls daily.  If this is still not adequate, then I would add on once daily Dulcolax (bisacodyl) tablet.  You can try Colace instead of the above regimen if you feel like this works better.   Be sure to drink at least 4 to 6 glasses of water daily.   I am concerned about your weight loss, I would recommend that you drink 2 protein shakes daily.  Follow-up in 2 to 3 months.  It was very nice seeing both you today.  Dr. Marletta Lor

## 2022-08-30 NOTE — Progress Notes (Signed)
Referring Provider: Jethro Bastos, MD Primary Care Physician:  Bayard Males, NP Primary GI:  Dr. Marletta Lor  Chief Complaint  Patient presents with   Abdominal Pain    Patient arrives with caregiver CNA Matthias Hughs. [facility]  Follow up on abdominal pain. States she is about the same. Not getting better. Unable to eat much due to pain. Eats a little bit at a time. Having weight loss.     HPI:   [name] is a 89 y.o. female who presents to the clinic for follow up visit.  She has a complex GI past medical history.  She has chronic abdominal pain, history of microscopic colitis, C. difficile in January 2022, chronic GERD.  Previous workup:  EGD in 11/2019 with mild Schatzki's ring not manipulated  CTA 11/2019 without evidence of chronic mesenteric ischemia,   Abdominal ultrasound 03/2020 without gallstones. HIDA scan also unrevealing at that time  Colonoscopy in 03/2020 with diverticulosis in the sigmoid and descending colon, 6 mm polyp in the sigmoid colon not removed (on Eliquis)  CTA March 2022 without evidence of large vessel disease  Hospitalized in November 17, 2021 with symptoms associated with COVID and presented with symptoms concerning for stroke.  Her stroke workup was negative and her mental status returned to baseline quickly.  She was noted to be chronically in A-fib with amiodarone, metoprolol, Eliquis for anticoagulation.   CT A/P without contrast 12/05/2021: -No acute or active cardiopulmonary disease -Cholelithiasis -Small hiatal hernia -Compression fracture deformities level of T12 and L1 -Stable from millimeter subpleural posterior right basilar lung nodule  Mesenteric [place] 07/26/22 WNL  ACTH stimulation test WNL  Unable to tolerate BuSpar due to hallucinations, not a candidate for TCAs in the past due to prolonged Qtc.  Simethicone appears to be helpful at times and reports intermittent increase in pain postprandially.     CT angio abd pelvis ordered on previous visit, not completed as her Cr bumped up to 1.91 5/24.   Today, continues to complain about abdominal pain.  Moderate in severity.  Tylenol helps but she rarely takes this.   Past Medical History:  Diagnosis Date   AICD (automatic cardioverter/defibrillator) present    Allergy    SESONAL   Aneurysm of infrarenal abdominal aorta (HCC)    Anxiety    ARTHRITIS    Arthritis    Atrial fibrillation (HCC)    CAD (coronary artery disease)    a. history of cardiac arrest 1993;  b. s/p LAD/LCX stenting in 2003;  c. 07/2011 Cath: LM nl, LAD patent stent, D1 80ost, LCX patent stent, RCA min irregs;  d. 04/2012 MV: EF 66%, no ishcemia;  e. 12/2013 Echo: Ef 55%, no rwma, Gr 1 DD, triv AI/MR, mildly dil LA;  f. 12/2013 Lexi MV: intermediate risk - apical ischemia and inf/infsept fixed defect, ? artifactual.   CAD in native artery 12/31/2013   Previous stents to LAD and Ramus patent on cath today 12/31/13 also There is severe disease in the ostial first diagonal which is unchanged from most recent cardiac catheterization. The right coronary artery could not be engaged selectively but nonselective angiography showed no significant disease in the proximal and midsegment.       Cataract    DENIES   Chronic back pain    Chronic sinus bradycardia    CKD (chronic kidney disease), stage III (HCC)    Clotting disorder (HCC)    DVT  COLITIS 12/02/2007   Qualifier: Diagnosis of  By: Koleen Distance CMA Duncan Dull), Hulan Saas     Diabetes mellitus without complication [facility])    DENIES   Diverticulosis of colon (without mention of hemorrhage) 2007   Colonoscopy    Esophageal stricture    a. 2012 s/p dil.   Esophagitis, unspecified    a. 2012 EGD   EXTERNAL HEMORRHOIDS    GERD (gastroesophageal reflux disease)    omeprazole   H/O hiatal hernia    Hiatal hernia    a. 2012 EGD.   History of DVT in the past, not on Coumadin now    left  leg   HYPERCHOLESTEROLEMIA    Hypertension     ICD (implantable cardiac defibrillator) in place    a. s/p initial ICD in 1993 in setting of cardiac arrest;  b. 01/2007 gen change: Guidant T135 Vitality DS VR single lead ICD.   Macular degeneration    gets injecton in eye every 5 weeks- last injection - 05/03/2013    Myocardial infarction [facility]) 1993   Near syncope 05/22/2015   Nephrolithiasis, just saw Dr Annabell Howells- "OK"    Orthostatic hypotension    Peripheral vascular disease (HCC)    ???   RECTAL BLEEDING 12/03/2007   Qualifier: Diagnosis of  By: Jarold Motto MD Mervyn Skeeters R    Unspecified gastritis and gastroduodenitis without mention of hemorrhage    a. 2003 EGD->not noted on 2012 EGD.   Unstable angina, neg MI, cath stable.maybe GI 12/30/2013    Past Surgical History:  Procedure Laterality Date   BREAST BIOPSY Left 11/2013   CARDIAC CATHETERIZATION     CARDIAC CATHETERIZATION  01/01/15   patent stents -there is severe disease in ostial 1st diag but without change.    CARDIAC DEFIBRILLATOR PLACEMENT  02/05/2007   Guidant   CARDIOVASCULAR STRESS TEST  12/30/2013   abnormal   CARDIOVERSION N/A 02/13/2019   Procedure: CARDIOVERSION;  Surgeon: Thurmon Fair, MD;  Location: [facility] ENDOSCOPY;  Service: Cardiovascular;  Laterality: N/A;   CATARACT EXTRACTION Right    Dr. Dione Booze   COLONOSCOPY     COLONOSCOPY WITH PROPOFOL N/A 03/30/2020   Procedure: COLONOSCOPY WITH PROPOFOL;  Surgeon: Dolores Frame, MD;  Location: [facility] ENDO [facility];  Service: Gastroenterology;  Laterality: N/A;   coronary stents      CYSTOSCOPY WITH URETEROSCOPY Right 05/20/2013   Procedure: CYSTOSCOPY, RIGHT URETEROSCOPY STONE EXTRACTION, Insertion of right DOUBLE J STENT ;  Surgeon: Bjorn Pippin, MD;  Location: [facility] ORS;  Service: Urology;  Laterality: Right;   CYSTOSCOPY WITH URETEROSCOPY AND STENT PLACEMENT N/A 06/03/2013   Procedure: SECOND LOOK CYSTOSCOPY WITH URETEROSCOPY  HOLMIUM LASER LITHO AND STONE EXTRACTION Newman Nip ;  Surgeon: Anner Crete, MD;  Location:  [facility] ORS;  Service: Urology;  Laterality: N/A;   ESOPHAGOGASTRODUODENOSCOPY (EGD) WITH PROPOFOL N/A 11/20/2019   Procedure: ESOPHAGOGASTRODUODENOSCOPY (EGD) WITH PROPOFOL;  Surgeon: Corbin Ade, MD;  Location: [facility];  Service: Endoscopy;  Laterality: N/A;   GIVENS CAPSULE STUDY N/A 10/30/2016   Procedure: GIVENS CAPSULE STUDY;  Surgeon: Hilarie Fredrickson, MD;  Location: [facility] ENDOSCOPY;  Service: Endoscopy;  Laterality: N/A;  NEEDS TO BE ADMITTED FOR OBSERVATION PT. HAS DEFIB   HEMORROIDECTOMY  80's   HOLMIUM LASER APPLICATION Right 05/20/2013   Procedure: HOLMIUM LASER APPLICATION;  Surgeon: Bjorn Pippin, MD;  Location: [facility] ORS;  Service: Urology;  Laterality: Right;   HYSTEROSCOPY WITH D & C  09/02/2010   Procedure: DILATATION AND CURETTAGE (D&C) /HYSTEROSCOPY;  Surgeon: Meriel Pica;  Location: [facility] ORS;  Service: Gynecology;  Laterality: N/A;  Dilation and Curettage with Hysteroscopy and Polypectomy   IMPLANTABLE CARDIOVERTER DEFIBRILLATOR (ICD) GENERATOR CHANGE N/A 02/10/2014   Procedure: ICD GENERATOR CHANGE;  Surgeon: Thurmon Fair, MD;  Location: [facility] CATH LAB;  Service: Cardiovascular;  Laterality: N/A;   LEFT HEART CATHETERIZATION WITH CORONARY ANGIOGRAM N/A 07/28/2011   Procedure: LEFT HEART CATHETERIZATION WITH CORONARY ANGIOGRAM;  Surgeon: Runell Gess, MD;  Location: [facility] CATH LAB;  Service: Cardiovascular;  Laterality: N/A;   LEFT HEART CATHETERIZATION WITH CORONARY ANGIOGRAM N/A 12/31/2013   Procedure: LEFT HEART CATHETERIZATION WITH CORONARY ANGIOGRAM;  Surgeon: Iran Ouch, MD;  Location: [facility] CATH LAB;  Service: Cardiovascular;  Laterality: N/A;    Current Outpatient Medications  Medication Sig Dispense Refill   acetaminophen (TYLENOL) 500 MG tablet Take 1 tablet (500 mg total) by mouth every 6 (six) hours as needed for mild pain, fever or headache. 30 tablet 0   Alpha-Lipoic Acid 600 MG TABS Take 600 mg by mouth daily. For neuropathy in legs 90 tablet 3   amiodarone  (PACERONE) 200 MG tablet Take 1 tablet (200 mg total) by mouth daily. 90 tablet 3   amLODipine (NORVASC) 2.5 MG tablet Take 1 tablet (2.5 mg total) by mouth daily. Do NOT take if BP drops below 130 on top or below 60 on bottom. 90 tablet 3   aspirin EC 81 MG EC tablet Take 1 tablet (81 mg total) by mouth daily. Swallow whole. 30 tablet 11   carboxymethylcellulose (REFRESH PLUS) 0.5 % SOLN Place 1 drop into both eyes 3 (three) times daily as needed (dry/irritated eyes.).     ELIQUIS 2.5 MG TABS tablet TAKE (1) TABLET TWICE A DAY. 180 tablet 1   gabapentin (NEURONTIN) 100 MG capsule TAKE UP TO 3 CAPSULES AT BEDTIME AS NEEDED 90 capsule 1   isosorbide mononitrate (IMDUR) 60 MG 24 hr tablet Take 1 tablet (60 mg total) by mouth daily. 30 tablet 11   latanoprost (XALATAN) 0.005 % ophthalmic solution Place 1 drop into the left eye at bedtime.     levothyroxine (SYNTHROID) 100 MCG tablet TAKE ONE TABLET BY MOUTH EVERY MORNING 90 tablet 1   MELATONIN PO Take 5 mg by mouth. Take 2 at bedtime     metoprolol tartrate (LOPRESSOR) 25 MG tablet Take 25 mg by mouth daily. Once daily     Multiple Vitamins-Minerals (CENTRUM SILVER ULTRA WOMENS PO) Take 1 tablet by mouth every morning.     nitroGLYCERIN (NITROSTAT) 0.4 MG SL tablet PLACE 1 TABLET UNDER THE TONGUE AT ONSET OF CHEST PAIN EVERY 5 MINTUES UP TO 3 TIMES AS NEEDED 25 tablet 2   pantoprazole (PROTONIX) 40 MG tablet Take 1 tablet (40 mg total) by mouth 2 (two) times daily before a meal. 90 tablet 0   rosuvastatin (CRESTOR) 10 MG tablet TAKE 1 TABLET DAILY 90 tablet 0   simethicone (MYLICON) 125 MG chewable tablet Chew 125 mg by mouth every 6 (six) hours as needed for flatulence.     venlafaxine (EFFEXOR) 37.5 MG tablet Take 37.5 mg by mouth daily. Once a day at noon.     metoprolol succinate (TOPROL-XL) 25 MG 24 hr tablet Take 0.5 tablets (12.5 mg total) by mouth daily. Take with or immediately following a meal. (Patient taking differently: Take 12.5 mg by  mouth 2 (two) times daily. Take with or immediately following a meal.) 15 tablet 11   No current facility-administered medications for  this visit.    Allergies as of 08/30/2022 - Review Complete 08/30/2022  Allergen Reaction Noted   Sotalol Other (See Comments) 11/01/2016   Tramadol Other (See Comments) 12/23/2019   Actos [pioglitazone] Swelling 07/31/2012   Adhesive [tape] Other (See Comments) 02/20/2014   Atorvastatin Other (See Comments) 07/31/2012   Bactrim [sulfamethoxazole-trimethoprim] Itching and Rash 08/19/2016   Ciprofloxacin Nausea Only 02/05/2018   Contrast media [iodinated contrast media] Other (See Comments) 07/25/2011   Latex Rash 05/29/2013   Pedi-pre tape spray [wound dressing adhesive] Other (See Comments) 02/20/2014   Sulfa antibiotics Other (See Comments) 02/20/2014    Family History  Problem Relation Age of Onset   Stroke Mother    Other Mother        brain tumor   Other Father        MI   Heart attack Father    Stroke Sister    Macular degeneration Sister    Diabetes Daughter    Cancer Daughter        ovarian   Colon polyps Daughter    Atrial fibrillation Sister    Hyperlipidemia Sister    Osteoporosis Sister    Stroke Sister    Uterine cancer Sister    Heart attack Brother    Heart disease Brother    Asthma Brother    Breast cancer Neg Hx     Social History   Socioeconomic History   Marital status: Widowed    Spouse name: Not on file   Number of children: 4   Years of education: 8   Highest education level: 8th grade  Occupational History   Occupation: Retired  Tobacco Use   Smoking status: Never   Smokeless tobacco: Never  Vaping Use   Vaping status: Never Used  Substance and Sexual Activity   Alcohol use: No   Drug use: No   Sexual activity: Never    Birth control/protection: Post-menopausal  Other Topics Concern   Not on file  Social History Narrative   Lives alone - son visits Sundays, daughter visits Saturday   Has an  aide Marcha Solders) 5 days per week   Social Determinants of Health   Financial Resource Strain: Low Risk  (09/27/2020)   Overall Financial Resource Strain (CARDIA)    Difficulty of Paying Living Expenses: Not hard at all  Food Insecurity: No Food Insecurity (09/27/2020)   Hunger Vital Sign    Worried About Running Out of Food in the Last Year: Never true    Ran Out of Food in the Last Year: Never true  Transportation Needs: No Transportation Needs (09/27/2020)   PRAPARE - Administrator, Civil Service (Medical): No    Lack of Transportation (Non-Medical): No  Physical Activity: Inactive (03/15/2021)   Exercise Vital Sign    Days of Exercise per Week: 0 days    Minutes of Exercise per Session: 0 min  Stress: Stress Concern Present (03/15/2021)   [employer] of Occupational Health - Occupational Stress Questionnaire    Feeling of Stress : To some extent  Social Connections: Moderately Isolated (09/27/2020)   Social Connection and Isolation Panel [NHANES]    Frequency of Communication with Friends and Family: More than three times a week    Frequency of Social Gatherings with Friends and Family: Twice a week    Attends Religious Services: 1 to 4 times per year    Active Member of [employer] or Organizations: No    Attends Banker Meetings:  Never    Marital Status: Widowed    Subjective: Review of Systems  Constitutional:  Negative for chills and fever.  HENT:  Negative for congestion and hearing loss.   Eyes:  Negative for blurred vision and double vision.  Respiratory:  Negative for cough and shortness of breath.   Cardiovascular:  Negative for chest pain and palpitations.  Gastrointestinal:  Positive for abdominal pain, constipation, diarrhea and heartburn. Negative for blood in stool, melena and vomiting.  Genitourinary:  Negative for dysuria and urgency.  Musculoskeletal:  Negative for joint pain and myalgias.  Skin:  Negative for itching and rash.   Neurological:  Negative for dizziness and headaches.  Psychiatric/Behavioral:  Negative for depression. The patient is not nervous/anxious.      Objective: BP (!) 104/50 (BP Location: Left Arm, Patient Position: Sitting, Cuff Size: Normal)   Pulse (!) 54   Temp 98 F (36.7 C) (Oral)   Ht 5\' 3"  (1.6 m)   Wt 115 lb 4.8 oz (52.3 kg)   BMI 20.42 kg/m  Physical Exam Constitutional:      Appearance: Normal appearance.     Comments: Underweight  HENT:     Head: Normocephalic and atraumatic.  Eyes:     Extraocular Movements: Extraocular movements intact.     Conjunctiva/sclera: Conjunctivae normal.  Cardiovascular:     Rate and Rhythm: Normal rate and regular rhythm.  Pulmonary:     Effort: Pulmonary effort is normal.     Breath sounds: Normal breath sounds.  Abdominal:     General: Bowel sounds are normal.     Palpations: Abdomen is soft.  Musculoskeletal:        General: No swelling. Normal range of motion.     Cervical back: Normal range of motion and neck supple.  Skin:    General: Skin is warm and dry.     Coloration: Skin is not jaundiced.  Neurological:     General: No focal deficit present.     Mental Status: She is alert and oriented to person, place, and time.  Psychiatric:        Mood and Affect: Mood normal.        Behavior: Behavior normal.      Assessment/Plan:  1.  Chronic abdominal pain-complex, significant previous workup see HPI.  Will check KUB today, possibly her constipation contributing.  May need to be on more aggressive bowel regimen.  Unable to tolerate TCAs, BuSpar caused hallucinations.    CT angiogram unable to be performed given rising creatinine.  Patient states she would not want any invasive procedures performed even if she did have evidence of significant vascular disease/mesenteric ischemia.  Mesenteric ultrasound reassuring.  She may have a component of microvascular angina.   Recommended she get more aggressive about her Tylenol use  as this is the only thing that seems to help.  She can take this 1-2 times daily.  Continue simethicone as needed for bloating.  2.  Weight loss-due to above, poor appetite as eating causes her discomfort.  Recommend she drink 2 protein shakes daily.  3.  Chronic GERD-well-controlled pantoprazole, continue.  Follow-up in 3 months. 08/30/2022 9:41 AM   Disclaimer: This note was dictated with voice recognition software. Similar sounding words can inadvertently be transcribed and may not be corrected upon review.

## 2022-08-31 ENCOUNTER — Ambulatory Visit (INDEPENDENT_AMBULATORY_CARE_PROVIDER_SITE_OTHER): Payer: Medicare (Managed Care)

## 2022-08-31 DIAGNOSIS — R001 Bradycardia, unspecified: Secondary | ICD-10-CM | POA: Diagnosis not present

## 2022-09-01 LAB — CUP PACEART REMOTE DEVICE CHECK
Battery Remaining Longevity: 102 mo
Battery Remaining Percentage: 92 %
Brady Statistic RV Percent Paced: 0 %
Date Time Interrogation Session: 20240815134000
HighPow Impedance: 39 Ohm
Implantable Lead Connection Status: 753985
Implantable Lead Implant Date: 20090120
Implantable Lead Location: 753860
Implantable Lead Model: 157
Implantable Lead Serial Number: 138237
Implantable Pulse Generator Implant Date: 20160126
Lead Channel Impedance Value: 486 Ohm
Lead Channel Pacing Threshold Amplitude: 1 V
Lead Channel Pacing Threshold Pulse Width: 0.5 ms
Lead Channel Setting Pacing Amplitude: 2.2 V
Lead Channel Setting Pacing Pulse Width: 0.5 ms
Lead Channel Setting Sensing Sensitivity: 0.6 mV
Pulse Gen Serial Number: 191956

## 2022-09-08 ENCOUNTER — Other Ambulatory Visit: Payer: Self-pay

## 2022-09-08 ENCOUNTER — Encounter (HOSPITAL_COMMUNITY): Payer: Self-pay | Admitting: *Deleted

## 2022-09-08 ENCOUNTER — Emergency Department (HOSPITAL_COMMUNITY): Payer: Medicare (Managed Care)

## 2022-09-08 ENCOUNTER — Inpatient Hospital Stay (HOSPITAL_COMMUNITY)
Admission: EM | Admit: 2022-09-08 | Discharge: 2022-09-12 | DRG: 481 | Disposition: A | Discharge status: Skilled Nursing Facility | Payer: Medicare (Managed Care) | Attending: Internal Medicine | Admitting: Internal Medicine

## 2022-09-08 DIAGNOSIS — Z823 Family history of stroke: Secondary | ICD-10-CM

## 2022-09-08 DIAGNOSIS — Z888 Allergy status to other drugs, medicaments and biological substances status: Secondary | ICD-10-CM

## 2022-09-08 DIAGNOSIS — Z79890 Hormone replacement therapy (postmenopausal): Secondary | ICD-10-CM

## 2022-09-08 DIAGNOSIS — Z86718 Personal history of other venous thrombosis and embolism: Secondary | ICD-10-CM

## 2022-09-08 DIAGNOSIS — Z83719 Family history of colon polyps, unspecified: Secondary | ICD-10-CM

## 2022-09-08 DIAGNOSIS — M80052A Age-related osteoporosis with current pathological fracture, left femur, initial encounter for fracture: Secondary | ICD-10-CM | POA: Diagnosis present

## 2022-09-08 DIAGNOSIS — F39 Unspecified mood [affective] disorder: Secondary | ICD-10-CM | POA: Diagnosis present

## 2022-09-08 DIAGNOSIS — I4729 Other ventricular tachycardia: Secondary | ICD-10-CM | POA: Diagnosis present

## 2022-09-08 DIAGNOSIS — Z885 Allergy status to narcotic agent status: Secondary | ICD-10-CM

## 2022-09-08 DIAGNOSIS — R9431 Abnormal electrocardiogram [ECG] [EKG]: Secondary | ICD-10-CM | POA: Diagnosis present

## 2022-09-08 DIAGNOSIS — I2510 Atherosclerotic heart disease of native coronary artery without angina pectoris: Secondary | ICD-10-CM | POA: Diagnosis present

## 2022-09-08 DIAGNOSIS — E039 Hypothyroidism, unspecified: Secondary | ICD-10-CM | POA: Diagnosis present

## 2022-09-08 DIAGNOSIS — N183 Chronic kidney disease, stage 3 unspecified: Secondary | ICD-10-CM | POA: Diagnosis present

## 2022-09-08 DIAGNOSIS — Z7901 Long term (current) use of anticoagulants: Secondary | ICD-10-CM

## 2022-09-08 DIAGNOSIS — W1830XA Fall on same level, unspecified, initial encounter: Secondary | ICD-10-CM | POA: Diagnosis present

## 2022-09-08 DIAGNOSIS — Z833 Family history of diabetes mellitus: Secondary | ICD-10-CM

## 2022-09-08 DIAGNOSIS — Z7982 Long term (current) use of aspirin: Secondary | ICD-10-CM | POA: Diagnosis not present

## 2022-09-08 DIAGNOSIS — D62 Acute posthemorrhagic anemia: Secondary | ICD-10-CM | POA: Diagnosis not present

## 2022-09-08 DIAGNOSIS — Z79899 Other long term (current) drug therapy: Secondary | ICD-10-CM

## 2022-09-08 DIAGNOSIS — Z87442 Personal history of urinary calculi: Secondary | ICD-10-CM

## 2022-09-08 DIAGNOSIS — Z91040 Latex allergy status: Secondary | ICD-10-CM

## 2022-09-08 DIAGNOSIS — E871 Hypo-osmolality and hyponatremia: Secondary | ICD-10-CM | POA: Diagnosis not present

## 2022-09-08 DIAGNOSIS — Z955 Presence of coronary angioplasty implant and graft: Secondary | ICD-10-CM | POA: Diagnosis not present

## 2022-09-08 DIAGNOSIS — K219 Gastro-esophageal reflux disease without esophagitis: Secondary | ICD-10-CM | POA: Diagnosis present

## 2022-09-08 DIAGNOSIS — E7800 Pure hypercholesterolemia, unspecified: Secondary | ICD-10-CM | POA: Diagnosis present

## 2022-09-08 DIAGNOSIS — E1140 Type 2 diabetes mellitus with diabetic neuropathy, unspecified: Secondary | ICD-10-CM | POA: Diagnosis present

## 2022-09-08 DIAGNOSIS — I482 Chronic atrial fibrillation, unspecified: Secondary | ICD-10-CM | POA: Diagnosis present

## 2022-09-08 DIAGNOSIS — Z95810 Presence of automatic (implantable) cardiac defibrillator: Secondary | ICD-10-CM | POA: Diagnosis present

## 2022-09-08 DIAGNOSIS — S72002A Fracture of unspecified part of neck of left femur, initial encounter for closed fracture: Secondary | ICD-10-CM | POA: Diagnosis present

## 2022-09-08 DIAGNOSIS — I129 Hypertensive chronic kidney disease with stage 1 through stage 4 chronic kidney disease, or unspecified chronic kidney disease: Secondary | ICD-10-CM | POA: Diagnosis present

## 2022-09-08 DIAGNOSIS — I480 Paroxysmal atrial fibrillation: Secondary | ICD-10-CM | POA: Diagnosis present

## 2022-09-08 DIAGNOSIS — N1831 Chronic kidney disease, stage 3a: Secondary | ICD-10-CM | POA: Diagnosis present

## 2022-09-08 DIAGNOSIS — E1151 Type 2 diabetes mellitus with diabetic peripheral angiopathy without gangrene: Secondary | ICD-10-CM | POA: Diagnosis present

## 2022-09-08 DIAGNOSIS — Z882 Allergy status to sulfonamides status: Secondary | ICD-10-CM

## 2022-09-08 DIAGNOSIS — E1122 Type 2 diabetes mellitus with diabetic chronic kidney disease: Secondary | ICD-10-CM | POA: Diagnosis present

## 2022-09-08 DIAGNOSIS — E876 Hypokalemia: Secondary | ICD-10-CM | POA: Diagnosis present

## 2022-09-08 DIAGNOSIS — Z91041 Radiographic dye allergy status: Secondary | ICD-10-CM

## 2022-09-08 DIAGNOSIS — S72142A Displaced intertrochanteric fracture of left femur, initial encounter for closed fracture: Principal | ICD-10-CM

## 2022-09-08 DIAGNOSIS — Z8262 Family history of osteoporosis: Secondary | ICD-10-CM

## 2022-09-08 DIAGNOSIS — Z825 Family history of asthma and other chronic lower respiratory diseases: Secondary | ICD-10-CM

## 2022-09-08 DIAGNOSIS — S72102A Unspecified trochanteric fracture of left femur, initial encounter for closed fracture: Secondary | ICD-10-CM | POA: Diagnosis not present

## 2022-09-08 DIAGNOSIS — I252 Old myocardial infarction: Secondary | ICD-10-CM | POA: Diagnosis not present

## 2022-09-08 DIAGNOSIS — Z8249 Family history of ischemic heart disease and other diseases of the circulatory system: Secondary | ICD-10-CM | POA: Diagnosis not present

## 2022-09-08 DIAGNOSIS — Z01810 Encounter for preprocedural cardiovascular examination: Secondary | ICD-10-CM | POA: Diagnosis not present

## 2022-09-08 DIAGNOSIS — M1711 Unilateral primary osteoarthritis, right knee: Secondary | ICD-10-CM | POA: Diagnosis present

## 2022-09-08 DIAGNOSIS — Z91048 Other nonmedicinal substance allergy status: Secondary | ICD-10-CM

## 2022-09-08 DIAGNOSIS — Z881 Allergy status to other antibiotic agents status: Secondary | ICD-10-CM

## 2022-09-08 DIAGNOSIS — Z8049 Family history of malignant neoplasm of other genital organs: Secondary | ICD-10-CM

## 2022-09-08 DIAGNOSIS — E119 Type 2 diabetes mellitus without complications: Secondary | ICD-10-CM

## 2022-09-08 DIAGNOSIS — Z8674 Personal history of sudden cardiac arrest: Secondary | ICD-10-CM

## 2022-09-08 DIAGNOSIS — Z83438 Family history of other disorder of lipoprotein metabolism and other lipidemia: Secondary | ICD-10-CM

## 2022-09-08 LAB — BASIC METABOLIC PANEL
Anion gap: 9 (ref 5–15)
BUN: 22 mg/dL (ref 8–23)
CO2: 24 mmol/L (ref 22–32)
Calcium: 8.5 mg/dL — ABNORMAL LOW (ref 8.9–10.3)
Chloride: 101 mmol/L (ref 98–111)
Creatinine, Ser: 1.58 mg/dL — ABNORMAL HIGH (ref 0.44–1.00)
GFR, Estimated: 31 mL/min — ABNORMAL LOW (ref >60)
Glucose, Bld: 145 mg/dL — ABNORMAL HIGH (ref 70–99)
Potassium: 3.4 mmol/L — ABNORMAL LOW (ref 3.5–5.1)
Sodium: 134 mmol/L — ABNORMAL LOW (ref 135–145)

## 2022-09-08 LAB — URINALYSIS, ROUTINE W REFLEX MICROSCOPIC
Bilirubin Urine: NEGATIVE
Glucose, UA: NEGATIVE mg/dL
Ketones, ur: NEGATIVE mg/dL
Nitrite: NEGATIVE
Protein, ur: 30 mg/dL — AB
RBC / HPF: >50 RBC/hpf (ref 0–5)
Specific Gravity, Urine: 1.005 (ref 1.005–1.030)
pH: 6 (ref 5.0–8.0)

## 2022-09-08 LAB — CBC
HCT: 34.4 % — ABNORMAL LOW (ref 36.0–46.0)
Hemoglobin: 11.4 g/dL — ABNORMAL LOW (ref 12.0–15.0)
MCH: 31.1 pg (ref 26.0–34.0)
MCHC: 33.1 g/dL (ref 30.0–36.0)
MCV: 94 fL (ref 80.0–100.0)
Platelets: 247 10*3/uL (ref 150–400)
RBC: 3.66 MIL/uL — ABNORMAL LOW (ref 3.87–5.11)
RDW: 14.6 % (ref 11.5–15.5)
WBC: 11 10*3/uL — ABNORMAL HIGH (ref 4.0–10.5)
nRBC: 0.2 % (ref 0.0–0.2)

## 2022-09-08 LAB — CBG MONITORING, ED: Glucose-Capillary: 122 mg/dL — ABNORMAL HIGH (ref 70–99)

## 2022-09-08 MED ORDER — GABAPENTIN 100 MG PO CAPS
100.0000 mg | ORAL_CAPSULE | Freq: Every day | ORAL | Status: DC
  Administered 2022-09-08 – 2022-09-11 (×4): 100 mg via ORAL
  Filled 2022-09-08 (×4): qty 1

## 2022-09-08 MED ORDER — HYDROMORPHONE HCL 1 MG/ML IJ SOLN
0.5000 mg | INTRAMUSCULAR | Status: DC | PRN
  Administered 2022-09-08 – 2022-09-09 (×3): 0.5 mg via INTRAVENOUS
  Filled 2022-09-08 (×3): qty 0.5

## 2022-09-08 MED ORDER — POLYETHYLENE GLYCOL 3350 17 G PO PACK: 17.0000 g | PACK | Freq: Every day | ORAL | Status: DC | PRN

## 2022-09-08 MED ORDER — LEVOTHYROXINE SODIUM 112 MCG PO TABS
112.0000 ug | ORAL_TABLET | Freq: Every day | ORAL | Status: DC
  Administered 2022-09-09 – 2022-09-12 (×4): 112 ug via ORAL
  Filled 2022-09-08 (×4): qty 1

## 2022-09-08 MED ORDER — AMIODARONE HCL 200 MG PO TABS
200.0000 mg | ORAL_TABLET | Freq: Every day | ORAL | Status: DC
  Administered 2022-09-10 – 2022-09-12 (×3): 200 mg via ORAL
  Filled 2022-09-08 (×4): qty 1

## 2022-09-08 MED ORDER — ONDANSETRON HCL 4 MG PO TABS: 4.0000 mg | ORAL_TABLET | Freq: Four times a day (QID) | ORAL | Status: DC | PRN

## 2022-09-08 MED ORDER — ISOSORBIDE MONONITRATE ER 60 MG PO TB24
60.0000 mg | ORAL_TABLET | Freq: Every day | ORAL | Status: DC
  Administered 2022-09-10 – 2022-09-12 (×2): 60 mg via ORAL
  Filled 2022-09-08 (×3): qty 1

## 2022-09-08 MED ORDER — LATANOPROST 0.005 % OP SOLN
1.0000 [drp] | Freq: Every day | OPHTHALMIC | Status: DC
  Administered 2022-09-08 – 2022-09-11 (×4): 1 [drp] via OPHTHALMIC
  Filled 2022-09-08: qty 2.5

## 2022-09-08 MED ORDER — ROSUVASTATIN CALCIUM 10 MG PO TABS
10.0000 mg | ORAL_TABLET | Freq: Every day | ORAL | Status: DC
  Administered 2022-09-10 – 2022-09-12 (×3): 10 mg via ORAL
  Filled 2022-09-08 (×3): qty 1

## 2022-09-08 MED ORDER — VENLAFAXINE HCL ER 37.5 MG PO CP24
37.5000 mg | ORAL_CAPSULE | Freq: Every day | ORAL | Status: DC
  Filled 2022-09-08: qty 1

## 2022-09-08 MED ORDER — SENNA 8.6 MG PO TABS
1.0000 | ORAL_TABLET | Freq: Two times a day (BID) | ORAL | Status: DC
  Administered 2022-09-08: 8.6 mg via ORAL
  Filled 2022-09-08: qty 1

## 2022-09-08 MED ORDER — METOPROLOL SUCCINATE ER 25 MG PO TB24
12.5000 mg | ORAL_TABLET | Freq: Two times a day (BID) | ORAL | Status: DC
  Administered 2022-09-08: 12.5 mg via ORAL
  Filled 2022-09-08: qty 1

## 2022-09-08 MED ORDER — ONDANSETRON HCL 4 MG/2ML IJ SOLN: 4.0000 mg | Freq: Four times a day (QID) | INTRAMUSCULAR | Status: DC | PRN

## 2022-09-08 MED ORDER — ENSURE ENLIVE PO LIQD: 237.0000 mL | Freq: Two times a day (BID) | ORAL | Status: DC

## 2022-09-08 MED ORDER — POLYVINYL ALCOHOL 1.4 % OP SOLN: 1.0000 [drp] | Freq: Three times a day (TID) | OPHTHALMIC | Status: DC | PRN

## 2022-09-08 MED ORDER — HYDROMORPHONE HCL 1 MG/ML IJ SOLN
0.5000 mg | Freq: Once | INTRAMUSCULAR | Status: AC
  Administered 2022-09-08: 0.5 mg via INTRAVENOUS
  Filled 2022-09-08: qty 0.5

## 2022-09-08 MED ORDER — AMLODIPINE BESYLATE 5 MG PO TABS: 2.5000 mg | ORAL_TABLET | Freq: Every day | ORAL | Status: DC

## 2022-09-08 MED ORDER — PANTOPRAZOLE SODIUM 40 MG PO TBEC
40.0000 mg | DELAYED_RELEASE_TABLET | Freq: Two times a day (BID) | ORAL | Status: DC
  Administered 2022-09-09 – 2022-09-12 (×6): 40 mg via ORAL
  Filled 2022-09-08 (×6): qty 1

## 2022-09-08 NOTE — ED Provider Notes (Signed)
[place] EMERGENCY DEPARTMENT AT [facility] Provider Note   CSN: [id number] Arrival date & time: 09/08/22  1541     History {Add pertinent medical, surgical, social history, OB history to HPI:1} No chief complaint on file.   [name] is a 89 y.o. female.  The history is provided by the patient, the EMS personnel and medical records. No language interpreter was used.     89 year old female no with multiple comorbidities which include CAD, atrial fibrillation currently on Eliquis, she has an ICD, diabetes, aneurysm, CAD, CKD brought here via EMS from home with concerns of low blood pressure after a fall.  Patient reports she was outside for several hours directing her son-in-law on different bushes that needs to be trimmed.  She mention she had her walker with her that has a seat on it.  She tried to sit down on it but missed the seat and landed directly into the ground.  She did not hit her head or loss of consciousness but states she was unable to get up.  States she was on the ground for about 10 to 15 minutes before her son-in-law realized that she has fallen.  States that he went somewhere to drop off the yard clipping and once he returned he noticed her on the ground.  She is not complaining of severe pain to her left hip.  Pain is sharp throbbing worse with movement.  She denies hitting her head no loss of consciousness no headache no neck pain no chest pain no trouble breathing no abdominal pain or back pain or pain to her extremities.  No precipitating symptoms prior to the fall.  No new numbness or weakness or confusion.  Home Medications Prior to Admission medications   Medication Sig Start Date End Date Taking? Authorizing Provider  acetaminophen (TYLENOL) 500 MG tablet Take 1 tablet (500 mg total) by mouth every 6 (six) hours as needed for mild pain, fever or headache. 04/05/20   Laural Benes, Clanford L, MD  Alpha-Lipoic Acid 600 MG TABS Take 600 mg by mouth daily.  For neuropathy in legs 08/18/20   Delynn Flavin M, DO  amiodarone (PACERONE) 200 MG tablet Take 1 tablet (200 mg total) by mouth daily. 10/04/20   Croitoru, Mihai, MD  amLODipine (NORVASC) 2.5 MG tablet Take 1 tablet (2.5 mg total) by mouth daily. Do NOT take if BP drops below 130 on top or below 60 on bottom. 03/01/21   Raliegh Ip, DO  aspirin EC 81 MG EC tablet Take 1 tablet (81 mg total) by mouth daily. Swallow whole. 02/09/21   Willeen Niece, MD  carboxymethylcellulose (REFRESH PLUS) 0.5 % SOLN Place 1 drop into both eyes 3 (three) times daily as needed (dry/irritated eyes.).    [provider]  ELIQUIS 2.5 MG TABS tablet TAKE (1) TABLET TWICE A DAY. 02/18/21   Croitoru, Mihai, MD  gabapentin (NEURONTIN) 100 MG capsule TAKE UP TO 3 CAPSULES AT BEDTIME AS NEEDED 06/03/21   Delynn Flavin M, DO  isosorbide mononitrate (IMDUR) 60 MG 24 hr tablet Take 1 tablet (60 mg total) by mouth daily. 01/31/21   Croitoru, Mihai, MD  latanoprost (XALATAN) 0.005 % ophthalmic solution Place 1 drop into the left eye at bedtime. 07/12/21   [provider]  levothyroxine (SYNTHROID) 100 MCG tablet TAKE ONE TABLET BY MOUTH EVERY MORNING 05/03/21   Delynn Flavin M, DO  MELATONIN PO Take 5 mg by mouth. Take 2 at bedtime    [provider]  metoprolol succinate (TOPROL-XL) 25 MG 24 hr tablet Take 0.5 tablets (12.5 mg total) by mouth daily. Take with or immediately following a meal. Patient taking differently: Take 12.5 mg by mouth 2 (two) times daily. Take with or immediately following a meal. 01/31/21 11/07/21  Croitoru, Mihai, MD  metoprolol tartrate (LOPRESSOR) 25 MG tablet Take 25 mg by mouth daily. Once daily    [provider]  Multiple Vitamins-Minerals (CENTRUM SILVER ULTRA WOMENS PO) Take 1 tablet by mouth every morning.    [provider]  nitroGLYCERIN (NITROSTAT) 0.4 MG SL tablet PLACE 1 TABLET UNDER THE TONGUE AT ONSET OF CHEST PAIN EVERY 5 MINTUES UP TO 3  TIMES AS NEEDED 01/05/21   Mechele Claude, MD  pantoprazole (PROTONIX) 40 MG tablet Take 1 tablet (40 mg total) by mouth 2 (two) times daily before a meal. 05/08/22   Aida Raider, NP  rosuvastatin (CRESTOR) 10 MG tablet TAKE 1 TABLET DAILY 05/17/21   Delynn Flavin M, DO  simethicone (MYLICON) 125 MG chewable tablet Chew 125 mg by mouth every 6 (six) hours as needed for flatulence.    [provider]  venlafaxine (EFFEXOR) 37.5 MG tablet Take 37.5 mg by mouth daily. Once a day at noon.    [provider]      Allergies    Sotalol, Tramadol, Actos [pioglitazone], Adhesive [tape], Atorvastatin, Bactrim [sulfamethoxazole-trimethoprim], Ciprofloxacin, Contrast media [iodinated contrast media], Latex, Pedi-pre tape spray [wound dressing adhesive], and Sulfa antibiotics    Review of Systems   Review of Systems  All other systems reviewed and are negative.   Physical Exam Updated Vital Signs BP (!) 164/81   Pulse 66   Temp 98.3 F (36.8 C) (Oral)   Resp 16   SpO2 96%  Physical Exam Vitals and nursing note reviewed.  Constitutional:      General: She is not in acute distress.    Appearance: She is well-developed.  HENT:     Head: Normocephalic and atraumatic.  Eyes:     Extraocular Movements: Extraocular movements intact.     Conjunctiva/sclera: Conjunctivae normal.     Pupils: Pupils are equal, round, and reactive to light.  Cardiovascular:     Rate and Rhythm: Normal rate and regular rhythm.     Pulses: Normal pulses.     Heart sounds: Normal heart sounds.  Pulmonary:     Effort: Pulmonary effort is normal.  Abdominal:     Palpations: Abdomen is soft.     Tenderness: There is no abdominal tenderness.  Musculoskeletal:        General: Tenderness (Left hip: Tenderness about the hip with decreased range of motion.  Left leg is shortened and internally rotated.  No laceration noted.) present.     Cervical back: Normal range of motion and neck supple. No  tenderness.     Comments: No significant tenderness along midline spine.  No tenderness to all 4 extremities with exception of left hip.  Skin:    Capillary Refill: Capillary refill takes less than 2 seconds.     Findings: No rash.  Neurological:     Mental Status: She is alert and oriented to person, place, and time.  Psychiatric:        Mood and Affect: Mood normal.     ED Results / Procedures / Treatments   Labs (all labs ordered are listed, but only abnormal results are displayed) Labs Reviewed  BASIC METABOLIC PANEL - Abnormal; Notable for the following components:  Result Value   Sodium 134 (*)    Potassium 3.4 (*)    Glucose, Bld 145 (*)    Creatinine, Ser 1.58 (*)    Calcium 8.5 (*)    GFR, Estimated 31 (*)    All other components within normal limits  CBC - Abnormal; Notable for the following components:   WBC 11.0 (*)    RBC 3.66 (*)    Hemoglobin 11.4 (*)    HCT 34.4 (*)    All other components within normal limits  CBG MONITORING, ED - Abnormal; Notable for the following components:   Glucose-Capillary 122 (*)    All other components within normal limits  URINALYSIS, ROUTINE W REFLEX MICROSCOPIC    EKG None  Radiology DG Hip Unilat W or Wo Pelvis 2-3 Views Left  Result Date: 09/08/2022 CLINICAL DATA:  Pain after fall EXAM: DG HIP (WITH OR WITHOUT PELVIS) 3V LEFT COMPARISON:  None Available. FINDINGS: Severe osteopenia. There is a angulated comminuted intertrochanteric left hip fracture with involvement specifically of the lesser trochanter. No additional fracture or dislocation. Degenerative changes seen of the sacroiliac joints and pubic symphysis. Vascular calcifications are seen. IMPRESSION: Severe osteopenia with a angulated comminuted and foreshortened intertrochanteric left hip fracture. Electronically Signed   By: Karen Kays M.D.   On: 09/08/2022 17:56    Procedures Procedures  {Document cardiac monitor, telemetry assessment procedure when  appropriate:1}  Medications Ordered in ED Medications  HYDROmorphone (DILAUDID) injection 0.5 mg (0.5 mg Intravenous Given 09/08/22 1701)    ED Course/ Medical Decision Making/ A&P   {   Click here for ABCD2, HEART and other calculatorsREFRESH Note before signing :1}                              Medical Decision Making Amount and/or Complexity of Data Reviewed Labs: ordered. Radiology: ordered.  Risk Prescription drug management.   BP (!) 179/79   Pulse 64   Temp 98.3 F (36.8 C) (Oral)   Resp 16   SpO2 96%   68:56 PM  89 year old female no with multiple comorbidities which include CAD, atrial fibrillation currently on Eliquis, she has an ICD, diabetes, aneurysm, CAD, CKD brought here via EMS from home with concerns of low blood pressure after a fall.  Patient reports she was outside for several hours directing her son-in-law on different bushes that needs to be trimmed.  She mention she had her walker with her that has a seat on it.  She tried to sit down on it but missed the seat and landed directly into the ground.  She did not hit her head or loss of consciousness but states she was unable to get up.  States she was on the ground for about 10 to 15 minutes before her son-in-law realized that she has fallen.  States that he went somewhere to drop off the yard clipping and once he returned he noticed her on the ground.  She is not complaining of severe pain to her left hip.  Pain is sharp throbbing worse with movement.  She denies hitting her head no loss of consciousness no headache no neck pain no chest pain no trouble breathing no abdominal pain or back pain or pain to her extremities.  No precipitating symptoms prior to the fall.  No new numbness or weakness or confusion.  On exam elderly female laying in bed appears slightly uncomfortable.  Exam is remarkable for tenderness about  her left hip and her left leg is shortened and internally rotated.  Finding is concerning for left hip  fracture.  This is a closed injury patient is neurovascular intact.  No signs of head or cervical spine trauma and no tenderness along midline spine.  No precipitating symptoms prior to her fall.  EMS initially documented that she has a low blood pressure of 60 over 40s and she was initially altered.  Patient did receive 600 cc of IV fluid prior to arrival.  At this time she is alert and oriented x 4 and her blood pressure is 179/79.  Care discussed with Dr. Rubin Payor.    -Labs ordered, independently viewed and interpreted by me.  Labs remarkable for Cr. 1.58. -The patient was maintained on a cardiac monitor.  I personally viewed and interpreted the cardiac monitored which showed an underlying rhythm of: NSR -Imaging independently viewed and interpreted by me and I agree with radiologist's interpretation.  Result remarkable for L hip xray showing left intertrochanteric left hip fx -This patient presents to the ED for concern of fall, this involves an extensive number of treatment options, and is a complaint that carries with it a high risk of complications and morbidity.  The differential diagnosis includes mechanical fall, fx, dislocation,strain, sprain -Co morbidities that complicate the patient evaluation includes afib, dvt, ICD, HTN, CAD -Treatment includes dilaudid -Reevaluation of the patient after these medicines showed that the patient improved -PCP office notes or outside notes reviewed -Discussion with specialist orthopedist DR. Romeo Apple and [facility] Dr. Adrian Blackwater -Escalation to admission/observation considered: patient is agreeable with admission.   {Document critical care time when appropriate:1} {Document review of labs and clinical decision tools ie heart score, Chads2Vasc2 etc:1}  {Document your independent review of radiology images, and any outside records:1} {Document your discussion with family members, caretakers, and with consultants:1} {Document social determinants of  health affecting pt's care:1} {Document your decision making why or why not admission, treatments were needed:1} Final Clinical Impression(s) / ED Diagnoses Final diagnoses:  None    Rx / DC Orders ED Discharge Orders     None

## 2022-09-08 NOTE — Progress Notes (Signed)
Patient ID: [name], female   DOB: [birth year], 89 y.o.   MRN: [id number]  Emergency room call to inquire about [name] 89 year old female defibrillator atrial fibrillation coronary artery disease previous stents placed on Eliquis who fell and fractured her left hip  She has a rather basic left intertrochanteric hip fracture.  I have conversed with Dr. Johnnette Litter regarding her anesthetic treatment as well as Dr. Adrian Blackwater and at this point it is felt that she would best be served at [facility] after conversing with Dr. Linna Caprice.  Her last Eliquis is taken this morning she is on a twice a day so Sunday would be the earliest she could have surgery  Dr. Linna Caprice has indicated that Dr. Charlann Boxer will be called by him to discuss surgery on Sunday

## 2022-09-08 NOTE — ED Provider Notes (Signed)
  Physical Exam  BP (!) 185/79   Pulse 60   Temp 98.3 F (36.8 C) (Oral)   Resp 16   SpO2 99%   Physical Exam  Procedures  Procedures  ED Course / MDM    Medical Decision Making Amount and/or Complexity of Data Reviewed Labs: ordered. Radiology: ordered.  Risk Prescription drug management. Decision regarding hospitalization.   Patient signed out to me from previous shift, she is being admitted for hip fracture, pending decision to come to [facility] or go to [facility].  Discussed with Dr. Romeo Apple who talked to orthopedics in [facility], would like patient to go [facility] long hospital, due to multiple comorbid medical problems do not feel she is appropriate to stay at [facility]  Given additional dose of pain medication her quest before transfer      Josem Kaufmann 09/08/22 2147    Benjiman Core, MD 09/09/22 [phone]

## 2022-09-08 NOTE — H&P (Signed)
History and Physical    Patient: [name] DOB: March 20, 1931 DOA: 09/08/2022 DOS: the patient was seen and examined on 09/08/2022 PCP: Bayard Males, NP  Patient coming from: Home  Chief Complaint:  Chief Complaint  Patient presents with   Fall   HPI: [name] is a 89 y.o. female with medical history significant of coronary artery disease status post stenting, chronic atrial fibrillation on anticoagulation and on amiodarone, stage III chronic kidney disease, catecholamine induced ventricular tachycardia with AICD presents.  Patient had a mechanical fall outside while supervising her grandson who was trimming the hedges.  She tried to sit down on her rolling walker and missed and fell on her left side.  She had immediate pain and was unable to ambulate.  She did appear out of it for a little while due to the pain.  Does not appear that she lost consciousness.  She continues to have pain in her left hip which is worse with movement.  The emergency department she got an x-ray that showed left comminuted hip fracture with osteoporosis.  Orthopedics was consulted and the decision was made that she needed to be transferred to [facility] long hospital due to her cardiac history.  I was consulted for admission.  Review of Systems: As mentioned in the history of present illness. All other systems reviewed and are negative. Past Medical History:  Diagnosis Date   AICD (automatic cardioverter/defibrillator) present    Allergy    SESONAL   Aneurysm of infrarenal abdominal aorta (HCC)    Anxiety    ARTHRITIS    Arthritis    Atrial fibrillation (HCC)    CAD (coronary artery disease)    a. history of cardiac arrest 1993;  b. s/p LAD/LCX stenting in 2003;  c. 07/2011 Cath: LM nl, LAD patent stent, D1 80ost, LCX patent stent, RCA min irregs;  d. 04/2012 MV: EF 66%, no ishcemia;  e. 12/2013 Echo: Ef 55%, no rwma, Gr 1 DD, triv AI/MR, mildly dil LA;  f. 12/2013 Lexi MV:  intermediate risk - apical ischemia and inf/infsept fixed defect, ? artifactual.   CAD in native artery 12/31/2013   Previous stents to LAD and Ramus patent on cath today 12/31/13 also There is severe disease in the ostial first diagonal which is unchanged from most recent cardiac catheterization. The right coronary artery could not be engaged selectively but nonselective angiography showed no significant disease in the proximal and midsegment.       Cataract    DENIES   Chronic back pain    Chronic sinus bradycardia    CKD (chronic kidney disease), stage III (HCC)    Clotting disorder (HCC)    DVT   COLITIS 12/02/2007   Qualifier: Diagnosis of  By: Koleen Distance CMA Duncan Dull), Hulan Saas     Diabetes mellitus without complication (HCC)    DENIES   Diverticulosis of colon (without mention of hemorrhage) 2007   Colonoscopy    Esophageal stricture    a. 2012 s/p dil.   Esophagitis, unspecified    a. 2012 EGD   EXTERNAL HEMORRHOIDS    GERD (gastroesophageal reflux disease)    omeprazole   H/O hiatal hernia    Hiatal hernia    a. 2012 EGD.   History of DVT in the past, not on Coumadin now    left  leg   HYPERCHOLESTEROLEMIA    Hypertension    ICD (implantable cardiac defibrillator) in place    a. s/p initial ICD in  1993 in setting of cardiac arrest;  b. 01/2007 gen change: Guidant T135 Vitality DS VR single lead ICD.   Macular degeneration    gets injecton in eye every 5 weeks- last injection - 05/03/2013    Myocardial infarction [facility]) 1993   Near syncope 05/22/2015   Nephrolithiasis, just saw Dr Annabell Howells- "OK"    Orthostatic hypotension    Peripheral vascular disease (HCC)    ???   RECTAL BLEEDING 12/03/2007   Qualifier: Diagnosis of  By: Jarold Motto MD Mervyn Skeeters R    Unspecified gastritis and gastroduodenitis without mention of hemorrhage    a. 2003 EGD->not noted on 2012 EGD.   Unstable angina, neg MI, cath stable.maybe GI 12/30/2013   Past Surgical History:  Procedure Laterality Date    BREAST BIOPSY Left 11/2013   CARDIAC CATHETERIZATION     CARDIAC CATHETERIZATION  01/01/15   patent stents -there is severe disease in ostial 1st diag but without change.    CARDIAC DEFIBRILLATOR PLACEMENT  02/05/2007   Guidant   CARDIOVASCULAR STRESS TEST  12/30/2013   abnormal   CARDIOVERSION N/A 02/13/2019   Procedure: CARDIOVERSION;  Surgeon: Thurmon Fair, MD;  Location: [facility] ENDOSCOPY;  Service: Cardiovascular;  Laterality: N/A;   CATARACT EXTRACTION Right    Dr. Dione Booze   COLONOSCOPY     COLONOSCOPY WITH PROPOFOL N/A 03/30/2020   Procedure: COLONOSCOPY WITH PROPOFOL;  Surgeon: Dolores Frame, MD;  Location: [facility] ENDO SUITE;  Service: Gastroenterology;  Laterality: N/A;   coronary stents      CYSTOSCOPY WITH URETEROSCOPY Right 05/20/2013   Procedure: CYSTOSCOPY, RIGHT URETEROSCOPY STONE EXTRACTION, Insertion of right DOUBLE J STENT ;  Surgeon: Bjorn Pippin, MD;  Location: [facility] ORS;  Service: Urology;  Laterality: Right;   CYSTOSCOPY WITH URETEROSCOPY AND STENT PLACEMENT N/A 06/03/2013   Procedure: SECOND LOOK CYSTOSCOPY WITH URETEROSCOPY  HOLMIUM LASER LITHO AND STONE EXTRACTION Newman Nip ;  Surgeon: Anner Crete, MD;  Location: [facility] ORS;  Service: Urology;  Laterality: N/A;   ESOPHAGOGASTRODUODENOSCOPY (EGD) WITH PROPOFOL N/A 11/20/2019   Procedure: ESOPHAGOGASTRODUODENOSCOPY (EGD) WITH PROPOFOL;  Surgeon: Corbin Ade, MD;  Location: [facility] SU[facility];  Service: Endoscopy;  Laterality: N/A;   GIVENS CAPSULE STUDY N/A 10/30/2016   Procedure: GIVENS CAPSULE STUDY;  Surgeon: Hilarie Fredrickson, MD;  Location: [facility] ENDOSCOPY;  Service: Endoscopy;  Laterality: N/A;  NEEDS TO BE ADMITTED FOR OBSERVATION PT. HAS DEFIB   HEMORROIDECTOMY  80's   HOLMIUM LASER APPLICATION Right 05/20/2013   Procedure: HOLMIUM LASER APPLICATION;  Surgeon: Bjorn Pippin, MD;  Location: [facility] ORS;  Service: Urology;  Laterality: Right;   HYSTEROSCOPY WITH D & C  09/02/2010   Procedure: DILATATION AND CURETTAGE (D&C) /HYSTEROSCOPY;   Surgeon: Meriel Pica;  Location: [facility] ORS;  Service: Gynecology;  Laterality: N/A;  Dilation and Curettage with Hysteroscopy and Polypectomy   IMPLANTABLE CARDIOVERTER DEFIBRILLATOR (ICD) GENERATOR CHANGE N/A 02/10/2014   Procedure: ICD GENERATOR CHANGE;  Surgeon: Thurmon Fair, MD;  Location: [facility] CATH LAB;  Service: Cardiovascular;  Laterality: N/A;   LEFT HEART CATHETERIZATION WITH CORONARY ANGIOGRAM N/A 07/28/2011   Procedure: LEFT HEART CATHETERIZATION WITH CORONARY ANGIOGRAM;  Surgeon: Runell Gess, MD;  Location: [facility] CATH LAB;  Service: Cardiovascular;  Laterality: N/A;   LEFT HEART CATHETERIZATION WITH CORONARY ANGIOGRAM N/A 12/31/2013   Procedure: LEFT HEART CATHETERIZATION WITH CORONARY ANGIOGRAM;  Surgeon: Iran Ouch, MD;  Location: [facility] CATH LAB;  Service: Cardiovascular;  Laterality: N/A;   Social History:  reports that she has  never smoked. She has never used smokeless tobacco. She reports that she does not drink alcohol and does not use drugs.  Allergies  Allergen Reactions   Sotalol Other (See Comments)    Torsades    Tramadol Other (See Comments)    Hallucination, bad-dream   Actos [Pioglitazone] Swelling   Adhesive [Tape] Other (See Comments)    Causes sores   Atorvastatin Other (See Comments)    myalgia   Bactrim [Sulfamethoxazole-Trimethoprim] Itching and Rash    Itchy rash   Ciprofloxacin Nausea Only   Contrast Media [Iodinated Contrast Media] Other (See Comments)    Headache (no action/pre-med required)   Latex Rash   Pedi-Pre Tape Spray [Wound Dressing Adhesive] Other (See Comments)    Causes sores   Sulfa Antibiotics Other (See Comments)    Causes sores    Family History  Problem Relation Age of Onset   Stroke Mother    Other Mother        brain tumor   Other Father        MI   Heart attack Father    Stroke Sister    Macular degeneration Sister    Diabetes Daughter    Cancer Daughter        ovarian   Colon polyps Daughter    Atrial  fibrillation Sister    Hyperlipidemia Sister    Osteoporosis Sister    Stroke Sister    Uterine cancer Sister    Heart attack Brother    Heart disease Brother    Asthma Brother    Breast cancer Neg Hx     Prior to Admission medications   Medication Sig Start Date End Date Taking? Authorizing Provider  acetaminophen (TYLENOL) 500 MG tablet Take 1 tablet (500 mg total) by mouth every 6 (six) hours as needed for mild pain, fever or headache. 04/05/20   Laural Benes, Clanford L, MD  Alpha-Lipoic Acid 600 MG TABS Take 600 mg by mouth daily. For neuropathy in legs 08/18/20   Delynn Flavin M, DO  amiodarone (PACERONE) 200 MG tablet Take 1 tablet (200 mg total) by mouth daily. 10/04/20   Croitoru, Mihai, MD  amLODipine (NORVASC) 2.5 MG tablet Take 1 tablet (2.5 mg total) by mouth daily. Do NOT take if BP drops below 130 on top or below 60 on bottom. 03/01/21   Raliegh Ip, DO  aspirin EC 81 MG EC tablet Take 1 tablet (81 mg total) by mouth daily. Swallow whole. 02/09/21   Willeen Niece, MD  carboxymethylcellulose (REFRESH PLUS) 0.5 % SOLN Place 1 drop into both eyes 3 (three) times daily as needed (dry/irritated eyes.).    [provider]  ELIQUIS 2.5 MG TABS tablet TAKE (1) TABLET TWICE A DAY. 02/18/21   Croitoru, Mihai, MD  gabapentin (NEURONTIN) 100 MG capsule TAKE UP TO 3 CAPSULES AT BEDTIME AS NEEDED 06/03/21   Delynn Flavin M, DO  isosorbide mononitrate (IMDUR) 60 MG 24 hr tablet Take 1 tablet (60 mg total) by mouth daily. 01/31/21   Croitoru, Mihai, MD  latanoprost (XALATAN) 0.005 % ophthalmic solution Place 1 drop into the left eye at bedtime. 07/12/21   [provider]  levothyroxine (SYNTHROID) 100 MCG tablet TAKE ONE TABLET BY MOUTH EVERY MORNING 05/03/21   Delynn Flavin M, DO  MELATONIN PO Take 5 mg by mouth. Take 2 at bedtime    [provider]  metoprolol succinate (TOPROL-XL) 25 MG 24 hr tablet Take 0.5 tablets (12.5 mg total) by mouth daily. Take  with  or immediately following a meal. Patient taking differently: Take 12.5 mg by mouth 2 (two) times daily. Take with or immediately following a meal. 01/31/21 11/07/21  Croitoru, Mihai, MD  metoprolol tartrate (LOPRESSOR) 25 MG tablet Take 25 mg by mouth daily. Once daily    [provider]  Multiple Vitamins-Minerals (CENTRUM SILVER ULTRA WOMENS PO) Take 1 tablet by mouth every morning.    [provider]  nitroGLYCERIN (NITROSTAT) 0.4 MG SL tablet PLACE 1 TABLET UNDER THE TONGUE AT ONSET OF CHEST PAIN EVERY 5 MINTUES UP TO 3 TIMES AS NEEDED 01/05/21   Mechele Claude, MD  pantoprazole (PROTONIX) 40 MG tablet Take 1 tablet (40 mg total) by mouth 2 (two) times daily before a meal. 05/08/22   Aida Raider, NP  rosuvastatin (CRESTOR) 10 MG tablet TAKE 1 TABLET DAILY 05/17/21   Delynn Flavin M, DO  simethicone (MYLICON) 125 MG chewable tablet Chew 125 mg by mouth every 6 (six) hours as needed for flatulence.    [provider]  venlafaxine (EFFEXOR) 37.5 MG tablet Take 37.5 mg by mouth daily. Once a day at noon.    [provider]    Physical Exam: Vitals:   09/08/22 1630 09/08/22 1645 09/08/22 1700 09/08/22 1920  BP: (!) 168/68 (!) 164/81 (!) 185/79 (!) 180/81  Pulse: 62 66 60 69  Resp:    16  Temp:      TempSrc:      SpO2: 97% 96% 99% 100%   General: elderly female. Awake and alert and oriented x3. No acute cardiopulmonary distress.  HEENT: Normocephalic atraumatic.  Right and left ears normal in appearance.  Pupils equal, round, reactive to light. Extraocular muscles are intact. Sclerae anicteric and noninjected.  Moist mucosal membranes. No mucosal lesions.  Neck: Neck supple without lymphadenopathy. No carotid bruits. No masses palpated.  Cardiovascular: Regular rate with normal S1-S2 sounds. No murmurs, rubs, gallops auscultated. No JVD.  Respiratory: Good respiratory effort with no wheezes, rales, rhonchi. Lungs clear to auscultation bilaterally.  No  accessory muscle use. Abdomen: Soft, nontender, nondistended. Active bowel sounds. No masses or hepatosplenomegaly  Skin: No rashes, lesions, or ulcerations.  Dry, warm to touch. 2+ dorsalis pedis and radial pulses. Musculoskeletal: Left leg shortened and externally rotated.  Neurovascularly intact distally..  No contractures  Psychiatric: Intact judgment and insight. Pleasant and cooperative. Neurologic: No focal neurological deficits. Strength is 5/5 and symmetric in upper and lower extremities.  Cranial nerves II through XII are grossly intact.  Data Reviewed: Results for orders placed or performed during the hospital encounter of 09/08/22 (from the past 24 hour(s))  CBG monitoring, ED     Status: Abnormal   Collection Time: 09/08/22  5:11 PM  Result Value Ref Range   Glucose-Capillary 122 (H) 70 - 99 mg/dL  Basic metabolic panel     Status: Abnormal   Collection Time: 09/08/22  5:40 PM  Result Value Ref Range   Sodium 134 (L) 135 - 145 mmol/L   Potassium 3.4 (L) 3.5 - 5.1 mmol/L   Chloride 101 98 - 111 mmol/L   CO2 24 22 - 32 mmol/L   Glucose, Bld 145 (H) 70 - 99 mg/dL   BUN 22 8 - 23 mg/dL   Creatinine, Ser 1.61 (H) 0.44 - 1.00 mg/dL   Calcium 8.5 (L) 8.9 - 10.3 mg/dL   GFR, Estimated 31 (L) >60 mL/min   Anion gap 9 5 - 15  CBC     Status: Abnormal  Collection Time: 09/08/22  5:40 PM  Result Value Ref Range   WBC 11.0 (H) 4.0 - 10.5 K/uL   RBC 3.66 (L) 3.87 - 5.11 MIL/uL   Hemoglobin 11.4 (L) 12.0 - 15.0 g/dL   HCT 47.8 (L) 29.5 - 62.1 %   MCV 94.0 80.0 - 100.0 fL   MCH 31.1 26.0 - 34.0 pg   MCHC 33.1 30.0 - 36.0 g/dL   RDW 30.8 65.7 - 84.6 %   Platelets 247 150 - 400 K/uL   nRBC 0.2 0.0 - 0.2 %   *Note: Due to a large number of results and/or encounters for the requested time period, some results have not been displayed. A complete set of results can be found in Results Review.    DG Hip Unilat W or Wo Pelvis 2-3 Views Left  Result Date: 09/08/2022 CLINICAL DATA:   Pain after fall EXAM: DG HIP (WITH OR WITHOUT PELVIS) 3V LEFT COMPARISON:  None Available. FINDINGS: Severe osteopenia. There is a angulated comminuted intertrochanteric left hip fracture with involvement specifically of the lesser trochanter. No additional fracture or dislocation. Degenerative changes seen of the sacroiliac joints and pubic symphysis. Vascular calcifications are seen. IMPRESSION: Severe osteopenia with a angulated comminuted and foreshortened intertrochanteric left hip fracture. Electronically Signed   By: Karen Kays M.D.   On: 09/08/2022 17:56     Assessment and Plan: No notes have been filed under this hospital service. Service: Hospitalist  Principal Problem:   Closed left hip fracture (HCC) Active Problems:   ICD (implantable cardioverter-defibrillator) in place   CKD (chronic kidney disease), stage III (HCC)   Catecholaminergic polymorphic ventricular tachycardia (HCC)   Diabetes mellitus type 2, diet-controlled (HCC)   Atrial fibrillation, chronic (HCC)   CAD (coronary artery disease)  Hip fracture Pain control Will hold anticoagulation.  Patient will need to be off anticoagulation for 48 hours so will be able to do surgery on Sunday as her last dose was this morning Ortho consulted Last device check was earlier this month with no firing.  Patient states that she has not had her ICD go off for quite some time. As it has been about 18 months since her last echo, will check an echocardiogram Diabetes Diet controlled Atrial fibrillation On amiodarone Hold anticoagulation Ventricular tachycardia with AICD Has ICD. Check Echo. Chronic kidney disease Appears to be at baseline.   Advance Care Planning:   Code Status: Full Code  Of note - in the past, the patient has had a DO NOT RESUSCITATE order. Additionally, she has a paper that states DNR.  However in conversations with the patient, she would like to be full code in this circumstance. The family is in  accordance to the patient's wishes to be FULL CODE.  Consults:   Family Communication: Son and daughter in Social worker.  Severity of Illness: The appropriate patient status for this patient is INPATIENT. Inpatient status is judged to be reasonable and necessary in order to provide the required intensity of service to ensure the patient's safety. The patient's presenting symptoms, physical exam findings, and initial radiographic and laboratory data in the context of their chronic comorbidities is felt to place them at high risk for further clinical deterioration. Furthermore, it is not anticipated that the patient will be medically stable for discharge from the hospital within 2 midnights of admission.   * I certify that at the point of admission it is my clinical judgment that the patient will require inpatient hospital care spanning beyond  2 midnights from the point of admission due to high intensity of service, high risk for further deterioration and high frequency of surveillance required.*  Author: Levie Heritage, DO 09/08/2022 8:42 PM  For on call review [email or web address].

## 2022-09-08 NOTE — Plan of Care (Signed)
  Problem: Coping: Goal: Level of anxiety will decrease Outcome: Progressing   Problem: Pain Managment: Goal: General experience of comfort will improve Outcome: Progressing   Problem: Safety: Goal: Ability to remain free from injury will improve Outcome: Progressing   

## 2022-09-08 NOTE — ED Triage Notes (Signed)
Pt fell from a standing position while outside clipping a bush, pt landed in grassy area and may have laid there for an hour.  EMS was called and found pt altered and hypotensive 60's/40's.  Pt with left hip pain.  Pt denies hitting her head. EMS started a line and 600cc given and pt arrived and is alert and oriented x 4 .

## 2022-09-09 ENCOUNTER — Inpatient Hospital Stay (HOSPITAL_COMMUNITY): Payer: Medicare (Managed Care) | Admitting: Anesthesiology

## 2022-09-09 ENCOUNTER — Encounter (HOSPITAL_COMMUNITY)
Admission: EM | Disposition: A | Discharge status: Skilled Nursing Facility | Payer: Self-pay | Source: Home / Self Care | Attending: Internal Medicine

## 2022-09-09 ENCOUNTER — Inpatient Hospital Stay (HOSPITAL_COMMUNITY): Payer: Medicare (Managed Care)

## 2022-09-09 ENCOUNTER — Other Ambulatory Visit: Payer: Self-pay

## 2022-09-09 DIAGNOSIS — I129 Hypertensive chronic kidney disease with stage 1 through stage 4 chronic kidney disease, or unspecified chronic kidney disease: Secondary | ICD-10-CM

## 2022-09-09 DIAGNOSIS — S72102A Unspecified trochanteric fracture of left femur, initial encounter for closed fracture: Secondary | ICD-10-CM

## 2022-09-09 DIAGNOSIS — N1831 Chronic kidney disease, stage 3a: Secondary | ICD-10-CM

## 2022-09-09 DIAGNOSIS — Z01810 Encounter for preprocedural cardiovascular examination: Secondary | ICD-10-CM | POA: Diagnosis not present

## 2022-09-09 DIAGNOSIS — E1122 Type 2 diabetes mellitus with diabetic chronic kidney disease: Secondary | ICD-10-CM | POA: Diagnosis not present

## 2022-09-09 DIAGNOSIS — S72002A Fracture of unspecified part of neck of left femur, initial encounter for closed fracture: Secondary | ICD-10-CM

## 2022-09-09 HISTORY — PX: INTRAMEDULLARY (IM) NAIL INTERTROCHANTERIC: SHX5875

## 2022-09-09 LAB — CBC
HCT: 31.2 % — ABNORMAL LOW (ref 36.0–46.0)
Hemoglobin: 10 g/dL — ABNORMAL LOW (ref 12.0–15.0)
MCH: 30.8 pg (ref 26.0–34.0)
MCHC: 32.1 g/dL (ref 30.0–36.0)
MCV: 96 fL (ref 80.0–100.0)
Platelets: 215 10*3/uL (ref 150–400)
RBC: 3.25 MIL/uL — ABNORMAL LOW (ref 3.87–5.11)
RDW: 14.6 % (ref 11.5–15.5)
WBC: 8 10*3/uL (ref 4.0–10.5)
nRBC: 0.3 % — ABNORMAL HIGH (ref 0.0–0.2)

## 2022-09-09 LAB — BASIC METABOLIC PANEL
Anion gap: 8 (ref 5–15)
BUN: 24 mg/dL — ABNORMAL HIGH (ref 8–23)
CO2: 24 mmol/L (ref 22–32)
Calcium: 8.5 mg/dL — ABNORMAL LOW (ref 8.9–10.3)
Chloride: 104 mmol/L (ref 98–111)
Creatinine, Ser: 1.6 mg/dL — ABNORMAL HIGH (ref 0.44–1.00)
GFR, Estimated: 31 mL/min — ABNORMAL LOW (ref >60)
Glucose, Bld: 140 mg/dL — ABNORMAL HIGH (ref 70–99)
Potassium: 3 mmol/L — ABNORMAL LOW (ref 3.5–5.1)
Sodium: 136 mmol/L (ref 135–145)

## 2022-09-09 LAB — GLUCOSE, CAPILLARY: Glucose-Capillary: 115 mg/dL — ABNORMAL HIGH (ref 70–99)

## 2022-09-09 LAB — ECHOCARDIOGRAM COMPLETE
Height: 63 in
S' Lateral: 2.9 cm
Weight: 1887.14 [oz_av]

## 2022-09-09 LAB — PROTIME-INR
INR: 1.2 (ref 0.8–1.2)
Prothrombin Time: 15.3 s — ABNORMAL HIGH (ref 11.4–15.2)

## 2022-09-09 LAB — SURGICAL PCR SCREEN
MRSA, PCR: NEGATIVE
Staphylococcus aureus: NEGATIVE

## 2022-09-09 SURGERY — FIXATION, FRACTURE, INTERTROCHANTERIC, WITH INTRAMEDULLARY ROD
Anesthesia: General | Site: Hip | Laterality: Left

## 2022-09-09 MED ORDER — ACETAMINOPHEN 325 MG PO TABS
650.0000 mg | ORAL_TABLET | Freq: Four times a day (QID) | ORAL | Status: DC
  Administered 2022-09-09 – 2022-09-12 (×12): 650 mg via ORAL
  Filled 2022-09-09 (×12): qty 2

## 2022-09-09 MED ORDER — METOPROLOL SUCCINATE ER 25 MG PO TB24
12.5000 mg | ORAL_TABLET | Freq: Two times a day (BID) | ORAL | Status: DC
  Administered 2022-09-10 – 2022-09-12 (×5): 12.5 mg via ORAL
  Filled 2022-09-09 (×5): qty 1

## 2022-09-09 MED ORDER — PHENYLEPHRINE HCL (PRESSORS) 10 MG/ML IV SOLN
INTRAVENOUS | Status: DC | PRN
Start: 2022-09-09 — End: 2022-09-09
  Administered 2022-09-09: 160 ug via INTRAVENOUS
  Administered 2022-09-09: 80 ug via INTRAVENOUS
  Administered 2022-09-09 (×2): 160 ug via INTRAVENOUS
  Administered 2022-09-09: 80 ug via INTRAVENOUS

## 2022-09-09 MED ORDER — DEXAMETHASONE SODIUM PHOSPHATE 10 MG/ML IJ SOLN
INTRAMUSCULAR | Status: AC
  Filled 2022-09-09: qty 1

## 2022-09-09 MED ORDER — DOCUSATE SODIUM 100 MG PO CAPS
100.0000 mg | ORAL_CAPSULE | Freq: Two times a day (BID) | ORAL | Status: DC
  Administered 2022-09-09 – 2022-09-11 (×5): 100 mg via ORAL
  Filled 2022-09-09 (×5): qty 1

## 2022-09-09 MED ORDER — HYDROMORPHONE HCL 1 MG/ML IJ SOLN: 0.5000 mg | INTRAMUSCULAR | Status: DC | PRN

## 2022-09-09 MED ORDER — TRANEXAMIC ACID-NACL 1000-0.7 MG/100ML-% IV SOLN
1000.0000 mg | Freq: Once | INTRAVENOUS | Status: AC
  Administered 2022-09-09: 1000 mg via INTRAVENOUS
  Filled 2022-09-09: qty 100

## 2022-09-09 MED ORDER — FENTANYL CITRATE (PF) 100 MCG/2ML IJ SOLN
INTRAMUSCULAR | Status: AC
  Filled 2022-09-09: qty 2

## 2022-09-09 MED ORDER — SUGAMMADEX SODIUM 200 MG/2ML IV SOLN
INTRAVENOUS | Status: DC | PRN
  Administered 2022-09-09: 200 mg via INTRAVENOUS

## 2022-09-09 MED ORDER — 0.9 % SODIUM CHLORIDE (POUR BTL) OPTIME
TOPICAL | Status: DC | PRN
Start: 2022-09-09 — End: 2022-09-09
  Administered 2022-09-09: 1000 mL

## 2022-09-09 MED ORDER — LIDOCAINE 2% (20 MG/ML) 5 ML SYRINGE
INTRAMUSCULAR | Status: DC | PRN
  Administered 2022-09-09: 50 mg via INTRAVENOUS

## 2022-09-09 MED ORDER — METHOCARBAMOL 500 MG PO TABS
500.0000 mg | ORAL_TABLET | Freq: Four times a day (QID) | ORAL | Status: DC | PRN
  Administered 2022-09-12: 500 mg via ORAL
  Filled 2022-09-09: qty 1

## 2022-09-09 MED ORDER — POLYETHYLENE GLYCOL 3350 17 G PO PACK: 17.0000 g | PACK | Freq: Every day | ORAL | Status: DC | PRN

## 2022-09-09 MED ORDER — METOCLOPRAMIDE HCL 5 MG/ML IJ SOLN: 5.0000 mg | Freq: Three times a day (TID) | INTRAMUSCULAR | Status: DC | PRN

## 2022-09-09 MED ORDER — POTASSIUM CHLORIDE CRYS ER 20 MEQ PO TBCR: 40.0000 meq | EXTENDED_RELEASE_TABLET | Freq: Once | ORAL | Status: DC

## 2022-09-09 MED ORDER — TRANEXAMIC ACID-NACL 1000-0.7 MG/100ML-% IV SOLN
1000.0000 mg | INTRAVENOUS | Status: AC
  Administered 2022-09-09: 1000 mg via INTRAVENOUS

## 2022-09-09 MED ORDER — DEXAMETHASONE SODIUM PHOSPHATE 10 MG/ML IJ SOLN
INTRAMUSCULAR | Status: DC | PRN
  Administered 2022-09-09: 5 mg via INTRAVENOUS

## 2022-09-09 MED ORDER — PROPOFOL 10 MG/ML IV BOLUS
INTRAVENOUS | Status: DC | PRN
  Administered 2022-09-09: 70 mg via INTRAVENOUS

## 2022-09-09 MED ORDER — ONDANSETRON HCL 4 MG/2ML IJ SOLN
INTRAMUSCULAR | Status: AC
  Filled 2022-09-09: qty 2

## 2022-09-09 MED ORDER — METOCLOPRAMIDE HCL 5 MG PO TABS: 5.0000 mg | ORAL_TABLET | Freq: Three times a day (TID) | ORAL | Status: DC | PRN

## 2022-09-09 MED ORDER — LIDOCAINE HCL (PF) 2 % IJ SOLN
INTRAMUSCULAR | Status: AC
  Filled 2022-09-09: qty 5

## 2022-09-09 MED ORDER — CEFAZOLIN SODIUM-DEXTROSE 2-4 GM/100ML-% IV SOLN
INTRAVENOUS | Status: AC
  Filled 2022-09-09: qty 100

## 2022-09-09 MED ORDER — CHLORHEXIDINE GLUCONATE 4 % EX SOLN: 60.0000 mL | Freq: Once | CUTANEOUS | Status: DC

## 2022-09-09 MED ORDER — ONDANSETRON HCL 4 MG/2ML IJ SOLN
INTRAMUSCULAR | Status: DC | PRN
  Administered 2022-09-09: 4 mg via INTRAVENOUS

## 2022-09-09 MED ORDER — MENTHOL 3 MG MT LOZG: 1.0000 | LOZENGE | OROMUCOSAL | Status: DC | PRN

## 2022-09-09 MED ORDER — FENTANYL CITRATE PF 50 MCG/ML IJ SOSY
PREFILLED_SYRINGE | INTRAMUSCULAR | Status: AC
  Filled 2022-09-09: qty 2

## 2022-09-09 MED ORDER — PROPOFOL 10 MG/ML IV BOLUS
INTRAVENOUS | Status: AC
  Filled 2022-09-09: qty 20

## 2022-09-09 MED ORDER — ROCURONIUM BROMIDE 10 MG/ML (PF) SYRINGE
PREFILLED_SYRINGE | INTRAVENOUS | Status: DC | PRN
  Administered 2022-09-09: 30 mg via INTRAVENOUS

## 2022-09-09 MED ORDER — POVIDONE-IODINE 10 % EX SWAB: 2.0000 | Freq: Once | CUTANEOUS | Status: DC

## 2022-09-09 MED ORDER — LACTATED RINGERS IV SOLN: INTRAVENOUS | Status: DC

## 2022-09-09 MED ORDER — PHENOL 1.4 % MT LIQD: 1.0000 | OROMUCOSAL | Status: DC | PRN

## 2022-09-09 MED ORDER — LACTATED RINGERS IV SOLN: INTRAVENOUS | Status: DC | PRN

## 2022-09-09 MED ORDER — OXYCODONE HCL 5 MG PO TABS
2.5000 mg | ORAL_TABLET | ORAL | Status: DC | PRN
  Administered 2022-09-09 – 2022-09-12 (×5): 2.5 mg via ORAL
  Filled 2022-09-09 (×5): qty 1

## 2022-09-09 MED ORDER — BISACODYL 10 MG RE SUPP: 10.0000 mg | Freq: Every day | RECTAL | Status: DC | PRN

## 2022-09-09 MED ORDER — METHOCARBAMOL 500 MG IVPB - SIMPLE MED
500.0000 mg | Freq: Four times a day (QID) | INTRAVENOUS | Status: DC | PRN
  Administered 2022-09-09: 500 mg via INTRAVENOUS
  Filled 2022-09-09: qty 500

## 2022-09-09 MED ORDER — OXYCODONE HCL 5 MG PO TABS: 5.0000 mg | ORAL_TABLET | ORAL | Status: DC | PRN

## 2022-09-09 MED ORDER — CEFAZOLIN SODIUM-DEXTROSE 2-4 GM/100ML-% IV SOLN
2.0000 g | Freq: Four times a day (QID) | INTRAVENOUS | Status: AC
  Administered 2022-09-09 (×2): 2 g via INTRAVENOUS
  Filled 2022-09-09 (×2): qty 100

## 2022-09-09 MED ORDER — SENNOSIDES-DOCUSATE SODIUM 8.6-50 MG PO TABS
1.0000 | ORAL_TABLET | Freq: Two times a day (BID) | ORAL | Status: DC
  Administered 2022-09-09 – 2022-09-11 (×5): 1 via ORAL
  Filled 2022-09-09 (×5): qty 1

## 2022-09-09 MED ORDER — CEFAZOLIN SODIUM-DEXTROSE 2-4 GM/100ML-% IV SOLN
2.0000 g | INTRAVENOUS | Status: AC
  Administered 2022-09-09: 2 g via INTRAVENOUS

## 2022-09-09 MED ORDER — ROCURONIUM BROMIDE 10 MG/ML (PF) SYRINGE
PREFILLED_SYRINGE | INTRAVENOUS | Status: AC
  Filled 2022-09-09: qty 10

## 2022-09-09 MED ORDER — TRANEXAMIC ACID-NACL 1000-0.7 MG/100ML-% IV SOLN
INTRAVENOUS | Status: AC
  Filled 2022-09-09: qty 100

## 2022-09-09 MED ORDER — POTASSIUM CHLORIDE 10 MEQ/100ML IV SOLN: 10.0000 meq | INTRAVENOUS | Status: AC

## 2022-09-09 MED ORDER — SODIUM CHLORIDE 0.9 % IV SOLN: INTRAVENOUS | Status: DC

## 2022-09-09 MED ORDER — FENTANYL CITRATE PF 50 MCG/ML IJ SOSY
25.0000 ug | PREFILLED_SYRINGE | INTRAMUSCULAR | Status: DC | PRN
  Administered 2022-09-09: 50 ug via INTRAVENOUS

## 2022-09-09 MED ORDER — PHENYLEPHRINE HCL-NACL 20-0.9 MG/250ML-% IV SOLN
INTRAVENOUS | Status: DC | PRN
  Administered 2022-09-09: 50 ug/min via INTRAVENOUS

## 2022-09-09 SURGICAL SUPPLY — 41 items
ADH SKN CLS APL DERMABOND .7 (GAUZE/BANDAGES/DRESSINGS) ×1
BAG COUNTER SPONGE SURGICOUNT (BAG) IMPLANT
BAG SPEC THK2 15X12 ZIP CLS (MISCELLANEOUS) ×1
BAG SPNG CNTER NS LX DISP (BAG)
BAG ZIPLOCK 12X15 (MISCELLANEOUS) ×2 IMPLANT
BIT DRILL CANN LG 4.3MM (BIT) IMPLANT
COVER PERINEAL POST (MISCELLANEOUS) ×2 IMPLANT
COVER SURGICAL LIGHT HANDLE (MISCELLANEOUS) ×2 IMPLANT
DERMABOND ADVANCED .7 DNX12 (GAUZE/BANDAGES/DRESSINGS) ×2 IMPLANT
DRAPE STERI IOBAN 125X83 (DRAPES) ×2 IMPLANT
DRILL BIT CANN LG 4.3MM (BIT) ×1
DRSG AQUACEL AG ADV 3.5X 4 (GAUZE/BANDAGES/DRESSINGS) IMPLANT
DRSG AQUACEL AG ADV 3.5X 6 (GAUZE/BANDAGES/DRESSINGS) ×4 IMPLANT
DURAPREP 26ML APPLICATOR (WOUND CARE) ×2 IMPLANT
ELECT REM PT RETURN 15FT ADLT (MISCELLANEOUS) IMPLANT
FACESHIELD WRAPAROUND (MASK) ×1 IMPLANT
FACESHIELD WRAPAROUND OR TEAM (MASK) ×2 IMPLANT
GLOVE BIO SURGEON STRL SZ 6 (GLOVE) ×2 IMPLANT
GLOVE BIOGEL PI IND STRL 6.5 (GLOVE) ×2 IMPLANT
GLOVE BIOGEL PI IND STRL 7.5 (GLOVE) ×4 IMPLANT
GLOVE ORTHO TXT STRL SZ7.5 (GLOVE) ×2 IMPLANT
GOWN STRL REUS W/ TWL LRG LVL3 (GOWN DISPOSABLE) ×4 IMPLANT
GOWN STRL REUS W/TWL LRG LVL3 (GOWN DISPOSABLE) ×2
GUIDEPIN VERSANAIL DSP 3.2X444 (ORTHOPEDIC DISPOSABLE SUPPLIES) IMPLANT
HIP FR NAIL LAG SCREW 10.5X110 (Orthopedic Implant) ×1 IMPLANT
KIT BASIN OR (CUSTOM PROCEDURE TRAY) ×2 IMPLANT
KIT TURNOVER KIT A (KITS) IMPLANT
MANIFOLD NEPTUNE II (INSTRUMENTS) ×2 IMPLANT
NAIL HIP FRACT 130D 11X180 (Screw) IMPLANT
PACK GENERAL/GYN (CUSTOM PROCEDURE TRAY) ×2 IMPLANT
PROTECTOR NERVE ULNAR (MISCELLANEOUS) ×2 IMPLANT
SCREW BONE CORTICAL 5.0X38 (Screw) IMPLANT
SCREW LAG HIP FR NAIL 10.5X110 (Orthopedic Implant) IMPLANT
SCREW LAG HIP NAIL 10.5X95 (Screw) IMPLANT
SUT MNCRL AB 4-0 PS2 18 (SUTURE) ×2 IMPLANT
SUT VIC AB 1 CT1 27 (SUTURE) ×1
SUT VIC AB 1 CT1 27XBRD ANBCTR (SUTURE) ×2 IMPLANT
SUT VIC AB 2-0 CT1 27 (SUTURE) ×1
SUT VIC AB 2-0 CT1 TAPERPNT 27 (SUTURE) ×2 IMPLANT
TOWEL OR 17X26 10 PK STRL BLUE (TOWEL DISPOSABLE) ×2 IMPLANT
TOWEL OR NON WOVEN STRL DISP B (DISPOSABLE) ×2 IMPLANT

## 2022-09-09 NOTE — Interval H&P Note (Signed)
History and Physical Interval Note:  09/09/2022 12:08 PM  [name]  has presented today for surgery, with the diagnosis of LEFT INTERTROCHANTERIC FRACTURE.  The various methods of treatment have been discussed with the patient and family. After consideration of risks, benefits and other options for treatment, the patient has consented to  Procedure(s): INTRAMEDULLARY (IM) NAIL INTERTROCHANTERIC (Left) as a surgical intervention.  The patient's history has been reviewed, patient examined, no change in status, stable for surgery.  I have reviewed the patient's chart and labs.  Questions were answered to the patient's satisfaction.     Shelda Pal

## 2022-09-09 NOTE — Anesthesia Preprocedure Evaluation (Addendum)
Anesthesia Evaluation  Patient identified by MRN, date of birth, ID band Patient awake    Reviewed: Allergy & Precautions, Patient's Chart, lab work & pertinent test results, reviewed documented beta blocker date and time , Unable to perform ROS - Chart review only  History of Anesthesia Complications Negative for: history of anesthetic complications  Airway Mallampati: II  TM Distance: >3 FB Neck ROM: Full    Dental  (+) Edentulous Upper, Edentulous Lower   Pulmonary shortness of breath and with exertion   Pulmonary exam normal breath sounds clear to auscultation       Cardiovascular Exercise Tolerance: Poor hypertension, Pt. on medications and Pt. on home beta blockers + angina  + CAD, + Past MI, + Cardiac Stents, + Peripheral Vascular Disease and + DVT  + Cardiac Defibrillator  Rhythm:Regular Rate:Normal + Systolic murmurs Echo 01/2021  1. Left ventricular ejection fraction, by estimation, is 50 to 55%. The left ventricle has low normal function. The left ventricle has no regional wall motion abnormalities. There is mild concentric left ventricular hypertrophy. Left ventricular diastolic parameters are consistent with Grade I diastolic dysfunction (impaired relaxation).   2. Right ventricular systolic function is normal. The right ventricular size is normal.   3. Left atrial size was severely dilated.   4. The mitral valve is degenerative. Trivial mitral valve regurgitation. No evidence of mitral stenosis.   5. The aortic valve is tricuspid. Aortic valve regurgitation is mild. No aortic stenosis is present.   6. Aortic dilatation noted. There is mild dilatation of the ascending aorta, measuring 41 mm.   Comparison(s): No significant change from prior study.      1. Left ventricular ejection fraction, by estimation, is 50 to 55%. The  left ventricle has normal function. The left ventricle has no regional  wall motion  abnormalities. There is mild left ventricular hypertrophy.  Left ventricular diastolic parameters  are consistent with Grade I diastolic dysfunction (impaired relaxation).   2. Right ventricular systolic function is normal. The right ventricular  size is normal. There is mildly elevated pulmonary artery systolic  pressure.   3. Left atrial size was severely dilated.   4. Right atrial size was mildly dilated.   5. The mitral valve is normal in structure. Trivial mitral valve  regurgitation. No evidence of mitral stenosis.   6. Tricuspid valve regurgitation is mild to moderate.   7. The aortic valve is tricuspid. Aortic valve regurgitation is mild.   8. The inferior vena cava is normal in size with greater than 50%  respiratory variability, suggesting right atrial pressure of 3 mmHg.    Neuro/Psych   Anxiety     negative neurological ROS     GI/Hepatic Neg liver ROS, hiatal hernia,GERD  Medicated,,  Endo/Other  diabetes, Type 2Hypothyroidism    Renal/GU Renal InsufficiencyRenal disease     Musculoskeletal  (+) Arthritis ,    Abdominal   Peds  Hematology  (+) Blood dyscrasia, anemia   Anesthesia Other Findings   Reproductive/Obstetrics                              Anesthesia Physical Anesthesia Plan  ASA: 4  Anesthesia Plan: General   Post-op Pain Management:    Induction: Intravenous  PONV Risk Score and Plan: 4 or greater and Ondansetron, Dexamethasone and Treatment may vary due to age or medical condition  Airway Management Planned: Oral ETT  Additional Equipment: None  Intra-op  Plan:   Post-operative Plan: Extubation in OR  Informed Consent: I have reviewed the patients History and Physical, chart, labs and discussed the procedure including the risks, benefits and alternatives for the proposed anesthesia with the patient or authorized representative who has indicated his/her understanding and acceptance.   Patient has DNR.   Discussed DNR with patient, Discussed DNR with power of attorney and Suspend DNR.   Dental advisory given  Plan Discussed with: CRNA  Anesthesia Plan Comments:          Anesthesia Quick Evaluation

## 2022-09-09 NOTE — Brief Op Note (Signed)
09/08/2022 - 09/09/2022  1:23 PM  PATIENT:  [name]  89 y.o. female  PRE-OPERATIVE DIAGNOSIS:  LEFT INTERTROCHANTERIC HIP FRACTURE  POST-OPERATIVE DIAGNOSIS:  LEFT INTERTROCHANTERIC HIP FRACTURE  PROCEDURE:  Procedure(s): INTRAMEDULLARY (IM) NAIL INTERTROCHANTERIC (Left)  SURGEON:  Surgeons and Role:    Durene Romans, MD - Primary  PHYSICIAN ASSISTANT: Rosalene Billings, PA-C  ANESTHESIA:   general  EBL:  100 mL   BLOOD ADMINISTERED:none  DRAINS: none   LOCAL MEDICATIONS USED:  NONE  SPECIMEN:  No Specimen  DISPOSITION OF SPECIMEN:  N/A  COUNTS:  YES  TOURNIQUET:  * No tourniquets in log *  DICTATION: .Other Dictation: Dictation Number [id number]  PLAN OF CARE: Admit to inpatient   PATIENT DISPOSITION:  PACU - hemodynamically stable.   Delay start of Pharmacological VTE agent (>24hrs) due to surgical blood loss or risk of bleeding: no

## 2022-09-09 NOTE — Progress Notes (Signed)
[facility] Progress Note Patient: [name]:[id number] DOB: 04/21/1931 DOA: 09/08/2022  DOS: the patient was seen and examined on 09/09/2022  Brief hospital course: PMH of CAD SP PCI, chronic A-fib, frequent V. tach SP AICD placement, CKD 3A presented to hospital with complaints of mechanical fall.  Found to have left hip fracture. Was originally presented in any pain hospital was transferred to  [facility] for orthopedic evaluation.  Assessment and Plan: Mechanical fall. Left hip fracture. Originally presented to [facility]. Transferred to [place]. Underwent left intertrochanteric intramedullary nail placement on 8/24. Monitor postop recovery. Patient is relatively high risk for poor surgical outcome although appears to have done well.  Right knee tenderness Left wrist erythema. Will get x-rays for further workup. Patient apparently have issues with her right knee chronically and was recommended to go for surgery although she did not receive any preop cardiac clearance.  Therefore currently being managed conservatively.  Will get x-rays for further evaluation.  CKD 3A. Renal function stable. Monitor.  Chronic A-fib. On amiodarone, aspirin, Eliquis and Toprol-XL. Continue for now.  Hypothyroidism. Continue Synthroid for now.  Neuropathy. On gabapentin 300 mg. Continue.  Mood disorder. Currently stable.  Send continue Effexor.  HLD. Continue Crestor.  GERD. Continuing Protonix twice daily AC.  Hypokalemia. Currently being treated aggressively.  Chronic anemia with dilutional drop. H&H dropped from 11-10 likely secondary to dilution. Baseline appears to be around 10-11. At risk for postop anemia secondary to anticoagulation. Monitor.  Goals of care conversation. Patient had a DNR.  But reportedly told admitting provider that she wants to be a full code. Family at bedside corroborating this as well. Will continue to discuss goals    Subjective: Reports severe pain.  No nausea no vomiting no fever no chills.  Daughter reports left wrist redness as well as right knee swelling.  Fall was reported as unwitnessed.  Physical Exam: General: in Mild distress, erythema noted of the right and left wrist with swelling of the left wrist and hand as well as swelling of the right knee with some erythema and warmth Cardiovascular: S1 and S2 Present, No Murmur Respiratory: Good respiratory effort, Bilateral Air entry present. No Crackles, No wheezes Abdomen: Bowel Sound present, No tenderness Extremities: ROM reduced secondary to pain bilateral knee, right leg edema Neuro: Alert and oriented x3, no new focal deficit  Data Reviewed: I have Reviewed nursing notes, Vitals, and Lab results. Since last encounter, pertinent lab results CBC and BMP   . I have ordered test including CBC and BMP  . I have discussed pt's care plan and test results with orthopedics Dr. Charlann Boxer  . I have ordered imaging x-ray knee and wrist  .  Disposition: Status is: Inpatient Remains inpatient appropriate because: Monitor for postop recovery and further workup  SCDs Start: 09/09/22 1525 SCDs Start: 09/08/22 2205   Family Communication: Family at bedside Level of care: Telemetry   Vitals:   09/09/22 1415 09/09/22 1430 09/09/22 1445 09/09/22 1516  BP: (!) 169/61 (!) 177/63 (!) 182/72 (!) 159/70  Pulse: (!) 53 (!) 55 (!) 57 (!) 57  Resp: 14 13 12 14   Temp:    98.2 F (36.8 C)  TempSrc:    Oral  SpO2: 98% 99% 100% 100%  Weight:      Height:         Author: Lynden Oxford, MD 09/09/2022 5:37 PM  Please look on [email or web address] to find out who is on call.

## 2022-09-09 NOTE — Op Note (Unsigned)
[name], [name] MEDICAL RECORD NO: [id number] ACCOUNT NO: [id number] DATE OF BIRTH: 09-20-1931 FACILITY: [facility] LOCATION: [facility] PHYSICIAN: Madlyn Frankel. Charlann Boxer, MD  Operative Report   DATE OF PROCEDURE: 09/09/2022  PREOPERATIVE DIAGNOSIS:  Comminuted peritrochanteric left hip fracture.  POSTOPERATIVE DIAGNOSIS:  Comminuted peritrochanteric left hip fracture.  PROCEDURE:  Closed reduction, intramedullary nailing of left hip fracture utilizing Biomet Affixus nail 11 x 180 mm with a 130-degree lag screw and a distal interlock.  SURGEON:  Madlyn Frankel. Charlann Boxer, MD  ASSISTANT:  Rosalene Billings, PA-C.  Note that Ms. Domenic Schwab was present for the entirety of the case from preoperative positioning, perioperative management of the operative extremity, general facilitation of the case and primary wound closure.  ANESTHESIA:  General.  BLOOD LOSS:  About 100 mL  DRAINS:  None.  COMPLICATIONS:  None.  INDICATIONS FOR PROCEDURE:  The patient is an 89 year old female with a ground level fall.  She had immediate onset of left hip pain with inability to bear weight.  She was transferred initially to our local emergency room and subsequently transferred  down for care to [facility].  Radiographs revealed a comminuted peritrochanteric/intertrochanteric femur fracture.  The necessity of the procedure was reviewed with the patient and her family.  Risks of malunion, nonunion, and need for future surgeries  were discussed and reviewed. Standard risk of infection, DVT reviewed.  Consent was obtained for the benefit of management of her fracture, pain management as well as functional capabilities.  DESCRIPTION OF PROCEDURE:  The patient was brought to the operative theater.  Once adequate anesthesia, preoperative antibiotics, Ancef administered, she was positioned supine on the fracture table with her left foot placed in the traction shoe.  Once  she was safely in position and padded including her right  unaffected extremity in a flexed, abducted position with all bony prominences carefully padded, particularly over the peroneal nerve laterally.  Fluoroscopy was brought to the field.  With  traction and internal rotation, we reduced the fracture into a near anatomic position, identifying not only the intertrochanteric segment, but a slightly subtrochanteric segment.  The left hip was then prepped and draped in sterile fashion.  A timeout  was performed identifying the patient, planned procedure, and extremity.  Fluoroscopy was brought back to the field.  Landmarks were identified.  An incision was made proximal to the trochanter.  Guidewire was inserted into the tip of the trochanter and  passed across the fracture site, confirmed radiographically both in the AP and lateral planes.  The proximal femur was opened with a drill and then the 11 x 100 mm nail was passed to its appropriate depth by hand.  Then, using the insertion jig, a  guidewire was inserted into the center of the femoral head from the AP and lateral planes.  Measured and selected a 95 mm screw.  The lag screw was positioned traction was let off the lower extremity.  Based on the fracture pattern, we elected to lock  this down as a fixed bearing device as opposed to allow for further compression with weightbearing.  Once this was positioned the final distal interlock was positioned off the insertion jig as well.  The jig was removed.  Final radiographs were obtained.   The wounds were irrigated with normal saline solution.  The proximal wound was closed in layers with #1 Vicryl in the gluteal fascia.  All remaining wounds were closed in layers with 2-0 Vicryl and a running Monocryl stitch.  The wounds were  clean, dry  and dressed sterilely using surgical glue and Aquacel dressing.  The patient was brought to the recovery room in stable condition, tolerating the procedure well.  Postoperatively, we will have her be partial weightbearing.  She  will return to see [place] in 2 weeks for wound and radiographic assessment of her fracture healing.   PUS D: 09/09/2022 1:28:25 pm T: 09/09/2022 1:51:00 pm  JOB: [id number]/ [id number]

## 2022-09-09 NOTE — Plan of Care (Signed)
Problem: Education: Goal: Knowledge of General Education information will improve Description: Including pain rating scale, medication(s)/side effects and non-pharmacologic comfort measures Outcome: Progressing   Problem: Clinical Measurements: Goal: Ability to maintain clinical measurements within normal limits will improve Outcome: Progressing   Problem: Coping: Goal: Level of anxiety will decrease Outcome: Progressing   Problem: Pain Managment: Goal: General experience of comfort will improve Outcome: Progressing   Problem: Safety: Goal: Ability to remain free from injury will improve Outcome: Progressing   Haydee Salter, RN 09/09/22 8:10 AM

## 2022-09-09 NOTE — Progress Notes (Signed)
  Echocardiogram 2D Echocardiogram has been performed.  [name] 09/09/2022, 5:39 PM

## 2022-09-09 NOTE — H&P (View-Only) (Signed)
Reason for Consult: left hip fracture Referring Physician: Allena Katz, MD  [name] is an 89 y.o. female.  HPI: [name] is a 89 y.o. female with medical history significant of coronary artery disease status post stenting, chronic atrial fibrillation on anticoagulation and on amiodarone, stage III chronic kidney disease, catecholamine induced ventricular tachycardia with AICD presents.  Patient had a mechanical fall outside while supervising her grandson who was trimming the hedges.  She tried to sit down on her rolling walker and missed and fell on her left side.  She had immediate pain and was unable to ambulate.  She did appear out of it for a little while due to the pain.  Does not appear that she lost consciousness.  She continues to have pain in her left hip which is worse with movement.  No other injuries reported after this fall.  Past Medical History:  Diagnosis Date   AICD (automatic cardioverter/defibrillator) present    Allergy    SESONAL   Aneurysm of infrarenal abdominal aorta (HCC)    Anxiety    ARTHRITIS    Arthritis    Atrial fibrillation (HCC)    CAD (coronary artery disease)    a. history of cardiac arrest 1993;  b. s/p LAD/LCX stenting in 2003;  c. 07/2011 Cath: LM nl, LAD patent stent, D1 80ost, LCX patent stent, RCA min irregs;  d. 04/2012 MV: EF 66%, no ishcemia;  e. 12/2013 Echo: Ef 55%, no rwma, Gr 1 DD, triv AI/MR, mildly dil LA;  f. 12/2013 Lexi MV: intermediate risk - apical ischemia and inf/infsept fixed defect, ? artifactual.   CAD in native artery 12/31/2013   Previous stents to LAD and Ramus patent on cath today 12/31/13 also There is severe disease in the ostial first diagonal which is unchanged from most recent cardiac catheterization. The right coronary artery could not be engaged selectively but nonselective angiography showed no significant disease in the proximal and midsegment.       Cataract    DENIES   Chronic back pain    Chronic sinus  bradycardia    CKD (chronic kidney disease), stage III (HCC)    Clotting disorder (HCC)    DVT   COLITIS 12/02/2007   Qualifier: Diagnosis of  By: Koleen Distance CMA Duncan Dull), Hulan Saas     Diabetes mellitus without complication (HCC)    DENIES   Diverticulosis of colon (without mention of hemorrhage) 2007   Colonoscopy    Esophageal stricture    a. 2012 s/p dil.   Esophagitis, unspecified    a. 2012 EGD   EXTERNAL HEMORRHOIDS    GERD (gastroesophageal reflux disease)    omeprazole   H/O hiatal hernia    Hiatal hernia    a. 2012 EGD.   History of DVT in the past, not on Coumadin now    left  leg   HYPERCHOLESTEROLEMIA    Hypertension    ICD (implantable cardiac defibrillator) in place    a. s/p initial ICD in 1993 in setting of cardiac arrest;  b. 01/2007 gen change: Guidant T135 Vitality DS VR single lead ICD.   Macular degeneration    gets injecton in eye every 5 weeks- last injection - 05/03/2013    Myocardial infarction [facility]) 1993   Near syncope 05/22/2015   Nephrolithiasis, just saw Dr Annabell Howells- "OK"    Orthostatic hypotension    Peripheral vascular disease (HCC)    ???   RECTAL BLEEDING 12/03/2007   Qualifier: Diagnosis of  By:  Jarold Motto MD Mervyn Skeeters R    Unspecified gastritis and gastroduodenitis without mention of hemorrhage    a. 2003 EGD->not noted on 2012 EGD.   Unstable angina, neg MI, cath stable.maybe GI 12/30/2013    Past Surgical History:  Procedure Laterality Date   BREAST BIOPSY Left 11/2013   CARDIAC CATHETERIZATION     CARDIAC CATHETERIZATION  01/01/15   patent stents -there is severe disease in ostial 1st diag but without change.    CARDIAC DEFIBRILLATOR PLACEMENT  02/05/2007   Guidant   CARDIOVASCULAR STRESS TEST  12/30/2013   abnormal   CARDIOVERSION N/A 02/13/2019   Procedure: CARDIOVERSION;  Surgeon: Thurmon Fair, MD;  Location: [facility] ENDOSCOPY;  Service: Cardiovascular;  Laterality: N/A;   CATARACT EXTRACTION Right    Dr. Dione Booze   COLONOSCOPY      COLONOSCOPY WITH PROPOFOL N/A 03/30/2020   Procedure: COLONOSCOPY WITH PROPOFOL;  Surgeon: Dolores Frame, MD;  Location: [facility] SUITE;  Service: Gastroenterology;  Laterality: N/A;   coronary stents      CYSTOSCOPY WITH URETEROSCOPY Right 05/20/2013   Procedure: CYSTOSCOPY, RIGHT URETEROSCOPY STONE EXTRACTION, Insertion of right DOUBLE J STENT ;  Surgeon: Bjorn Pippin, MD;  Location: [facility] ORS;  Service: Urology;  Laterality: Right;   CYSTOSCOPY WITH URETEROSCOPY AND STENT PLACEMENT N/A 06/03/2013   Procedure: SECOND LOOK CYSTOSCOPY WITH URETEROSCOPY  HOLMIUM LASER LITHO AND STONE EXTRACTION Newman Nip ;  Surgeon: Anner Crete, MD;  Location: [facility] ORS;  Service: Urology;  Laterality: N/A;   ESOPHAGOGASTRODUODENOSCOPY (EGD) WITH PROPOFOL N/A 11/20/2019   Procedure: ESOPHAGOGASTRODUODENOSCOPY (EGD) WITH PROPOFOL;  Surgeon: Corbin Ade, MD;  Location: [facility];  Service: Endoscopy;  Laterality: N/A;   GIVENS CAPSULE STUDY N/A 10/30/2016   Procedure: GIVENS CAPSULE STUDY;  Surgeon: Hilarie Fredrickson, MD;  Location: [facility] ENDOSCOPY;  Service: Endoscopy;  Laterality: N/A;  NEEDS TO BE ADMITTED FOR OBSERVATION PT. HAS DEFIB   HEMORROIDECTOMY  80's   HOLMIUM LASER APPLICATION Right 05/20/2013   Procedure: HOLMIUM LASER APPLICATION;  Surgeon: Bjorn Pippin, MD;  Location: [facility] ORS;  Service: Urology;  Laterality: Right;   HYSTEROSCOPY WITH D & C  09/02/2010   Procedure: DILATATION AND CURETTAGE (D&C) /HYSTEROSCOPY;  Surgeon: Meriel Pica;  Location: [facility] ORS;  Service: Gynecology;  Laterality: N/A;  Dilation and Curettage with Hysteroscopy and Polypectomy   IMPLANTABLE CARDIOVERTER DEFIBRILLATOR (ICD) GENERATOR CHANGE N/A 02/10/2014   Procedure: ICD GENERATOR CHANGE;  Surgeon: Thurmon Fair, MD;  Location: [facility] CATH LAB;  Service: Cardiovascular;  Laterality: N/A;   LEFT HEART CATHETERIZATION WITH CORONARY ANGIOGRAM N/A 07/28/2011   Procedure: LEFT HEART CATHETERIZATION WITH CORONARY ANGIOGRAM;  Surgeon: Runell Gess, MD;  Location: [facility] CATH LAB;  Service: Cardiovascular;  Laterality: N/A;   LEFT HEART CATHETERIZATION WITH CORONARY ANGIOGRAM N/A 12/31/2013   Procedure: LEFT HEART CATHETERIZATION WITH CORONARY ANGIOGRAM;  Surgeon: Iran Ouch, MD;  Location: [facility] CATH LAB;  Service: Cardiovascular;  Laterality: N/A;    Family History  Problem Relation Age of Onset   Stroke Mother    Other Mother        brain tumor   Other Father        MI   Heart attack Father    Stroke Sister    Macular degeneration Sister    Diabetes Daughter    Cancer Daughter        ovarian   Colon polyps Daughter    Atrial fibrillation Sister    Hyperlipidemia Sister  Osteoporosis Sister    Stroke Sister    Uterine cancer Sister    Heart attack Brother    Heart disease Brother    Asthma Brother    Breast cancer Neg Hx     Social History:  reports that she has never smoked. She has never used smokeless tobacco. She reports that she does not drink alcohol and does not use drugs.  Allergies:  Allergies  Allergen Reactions   Sotalol Other (See Comments)    Torsades    Tramadol Other (See Comments)    Hallucination, bad-dream   Actos [Pioglitazone] Swelling   Adhesive [Tape] Other (See Comments)    Causes sores   Atorvastatin Other (See Comments)    myalgia   Bactrim [Sulfamethoxazole-Trimethoprim] Itching and Rash    Itchy rash   Ciprofloxacin Nausea Only   Contrast Media [Iodinated Contrast Media] Other (See Comments)    Headache (no action/pre-med required)   Latex Rash   Pedi-Pre Tape Spray [Wound Dressing Adhesive] Other (See Comments)    Causes sores   Sulfa Antibiotics Other (See Comments)    Causes sores    Medications: I have reviewed the patient's current medications. Scheduled:  amiodarone  200 mg Oral Daily   amLODipine  2.5 mg Oral Daily   feeding supplement  237 mL Oral BID BM   gabapentin  100 mg Oral QHS   isosorbide mononitrate  60 mg Oral Daily   latanoprost  1 drop Left  Eye QHS   levothyroxine  112 mcg Oral Q0600   metoprolol succinate  12.5 mg Oral BID   pantoprazole  40 mg Oral BID AC   potassium chloride  40 mEq Oral Once   rosuvastatin  10 mg Oral Daily   senna  1 tablet Oral BID   venlafaxine XR  37.5 mg Oral Q breakfast    Results for orders placed or performed during the hospital encounter of 09/08/22 (from the past 24 hour(s))  CBG monitoring, ED     Status: Abnormal   Collection Time: 09/08/22  5:11 PM  Result Value Ref Range   Glucose-Capillary 122 (H) 70 - 99 mg/dL  Basic metabolic panel     Status: Abnormal   Collection Time: 09/08/22  5:40 PM  Result Value Ref Range   Sodium 134 (L) 135 - 145 mmol/L   Potassium 3.4 (L) 3.5 - 5.1 mmol/L   Chloride 101 98 - 111 mmol/L   CO2 24 22 - 32 mmol/L   Glucose, Bld 145 (H) 70 - 99 mg/dL   BUN 22 8 - 23 mg/dL   Creatinine, Ser 0.10 (H) 0.44 - 1.00 mg/dL   Calcium 8.5 (L) 8.9 - 10.3 mg/dL   GFR, Estimated 31 (L) >60 mL/min   Anion gap 9 5 - 15  CBC     Status: Abnormal   Collection Time: 09/08/22  5:40 PM  Result Value Ref Range   WBC 11.0 (H) 4.0 - 10.5 K/uL   RBC 3.66 (L) 3.87 - 5.11 MIL/uL   Hemoglobin 11.4 (L) 12.0 - 15.0 g/dL   HCT 27.2 (L) 53.6 - 64.4 %   MCV 94.0 80.0 - 100.0 fL   MCH 31.1 26.0 - 34.0 pg   MCHC 33.1 30.0 - 36.0 g/dL   RDW 03.4 74.2 - 59.5 %   Platelets 247 150 - 400 K/uL   nRBC 0.2 0.0 - 0.2 %  Urinalysis, Routine w reflex microscopic -Urine, Catheterized     Status: Abnormal   Collection  Time: 09/08/22  8:38 PM  Result Value Ref Range   Color, Urine YELLOW YELLOW   APPearance HAZY (A) CLEAR   Specific Gravity, Urine 1.005 1.005 - 1.030   pH 6.0 5.0 - 8.0   Glucose, UA NEGATIVE NEGATIVE mg/dL   Hgb urine dipstick LARGE (A) NEGATIVE   Bilirubin Urine NEGATIVE NEGATIVE   Ketones, ur NEGATIVE NEGATIVE mg/dL   Protein, ur 30 (A) NEGATIVE mg/dL   Nitrite NEGATIVE NEGATIVE   Leukocytes,Ua SMALL (A) NEGATIVE   RBC / HPF >50 0 - 5 RBC/hpf   WBC, UA 6-10 0 - 5  WBC/hpf   Bacteria, UA MANY (A) NONE SEEN   Squamous Epithelial / HPF 0-5 0 - 5 /HPF   Mucus PRESENT   Surgical pcr screen     Status: None   Collection Time: 09/08/22 10:06 PM   Specimen: Nasal Mucosa; Nasal Swab  Result Value Ref Range   MRSA, PCR NEGATIVE NEGATIVE   Staphylococcus aureus NEGATIVE NEGATIVE  Basic metabolic panel     Status: Abnormal   Collection Time: 09/09/22  3:58 AM  Result Value Ref Range   Sodium 136 135 - 145 mmol/L   Potassium 3.0 (L) 3.5 - 5.1 mmol/L   Chloride 104 98 - 111 mmol/L   CO2 24 22 - 32 mmol/L   Glucose, Bld 140 (H) 70 - 99 mg/dL   BUN 24 (H) 8 - 23 mg/dL   Creatinine, Ser 0.27 (H) 0.44 - 1.00 mg/dL   Calcium 8.5 (L) 8.9 - 10.3 mg/dL   GFR, Estimated 31 (L) >60 mL/min   Anion gap 8 5 - 15  CBC     Status: Abnormal   Collection Time: 09/09/22  3:58 AM  Result Value Ref Range   WBC 8.0 4.0 - 10.5 K/uL   RBC 3.25 (L) 3.87 - 5.11 MIL/uL   Hemoglobin 10.0 (L) 12.0 - 15.0 g/dL   HCT 25.3 (L) 66.4 - 40.3 %   MCV 96.0 80.0 - 100.0 fL   MCH 30.8 26.0 - 34.0 pg   MCHC 32.1 30.0 - 36.0 g/dL   RDW 47.4 25.9 - 56.3 %   Platelets 215 150 - 400 K/uL   nRBC 0.3 (H) 0.0 - 0.2 %  Protime-INR     Status: Abnormal   Collection Time: 09/09/22  3:58 AM  Result Value Ref Range   Prothrombin Time 15.3 (H) 11.4 - 15.2 seconds   INR 1.2 0.8 - 1.2   *Note: Due to a large number of results and/or encounters for the requested time period, some results have not been displayed. A complete set of results can be found in Results Review.     X-ray: CLINICAL DATA:  Pain after fall   EXAM: DG HIP (WITH OR WITHOUT PELVIS) 3V LEFT   COMPARISON:  None Available.   FINDINGS: Severe osteopenia. There is a angulated comminuted intertrochanteric left hip fracture with involvement specifically of the lesser trochanter. No additional fracture or dislocation. Degenerative changes seen of the sacroiliac joints and pubic symphysis. Vascular calcifications are seen.    IMPRESSION: Severe osteopenia with a angulated comminuted and foreshortened intertrochanteric left hip fracture.     Electronically Signed   By: Karen Kays M.D.  ROS: As per her admitting HPI  Blood pressure (!) 99/53, pulse 61, temperature 97.7 F (36.5 C), temperature source Oral, resp. rate 17, height 5\' 3"  (1.6 m), weight 53.5 kg, SpO2 96%.  Physical Exam: General: elderly female. Awake and alert and oriented  x3. No acute cardiopulmonary distress.  HEENT: Normocephalic atraumatic.  Right and left ears normal in appearance.  Pupils equal, round, reactive to light. Extraocular muscles are intact. Sclerae anicteric and noninjected.  Moist mucosal membranes. No mucosal lesions.  Neck: Neck supple without lymphadenopathy. No carotid bruits. No masses palpated.  Cardiovascular: Regular rate with normal S1-S2 sounds. No murmurs, rubs, gallops auscultated. No JVD.  Respiratory: Good respiratory effort with no wheezes, rales, rhonchi. Lungs clear to auscultation bilaterally.  No accessory muscle use. Abdomen: Soft, nontender, nondistended. Active bowel sounds. No masses or hepatosplenomegaly  Skin: No rashes, lesions, or ulcerations.  Dry, warm to touch. 2+ dorsalis pedis and radial pulses. Musculoskeletal: Left leg shortened and externally rotated.  Neurovascularly intact distally..  No contractures  Psychiatric: Intact judgment and insight. Pleasant and cooperative. Neurologic: No focal neurological deficits. Strength is 5/5 and symmetric in upper and lower extremities.  Cranial nerves II through XII are grossly intact.  Assessment/Plan: Left intertrochanteric hp fracture  Plan: She will need operative repair of fracture for pain control and mobility I am going to get this addressed today NPO Consent ordered Post op orders and instructions to follow procedure  Shelda Pal 09/09/2022, 7:48 AM

## 2022-09-09 NOTE — Progress Notes (Signed)
Patient arrived via [facility] from [facility], A&Ox4, obvious deformity noted to left hip, after patient was turned for skin check and removal of linens a pillow was placed under the left hip and ice packs applied, patient denied any pain without movement, due to intense pain with turning and patient having some incontinence, a purewick had been placed at [facility] and we will continue to use here, tremors noted to upper extremities, BLE cool to touch, +2 palpable pedal pulses, purewick in place and U/A was sent at [facility], family aware of her transfer but will not be here until tomorrow morning, telemetry monitor hooked up, mrsa swab sent, admission completed, will continue to monitor.

## 2022-09-09 NOTE — Anesthesia Procedure Notes (Signed)
Procedure Name: Intubation Date/Time: 09/09/2022 12:36 PM  Performed by: Doran Clay, CRNAPre-anesthesia Checklist: Patient identified, Emergency Drugs available, Suction available, Patient being monitored and Timeout performed Patient Re-evaluated:Patient Re-evaluated prior to induction Oxygen Delivery Method: Circle system utilized Preoxygenation: Pre-oxygenation with 100% oxygen Induction Type: IV induction Laryngoscope Size: Mac and 3 Grade View: Grade I Tube type: Oral Tube size: 7.0 mm Number of attempts: 1 Airway Equipment and Method: Stylet Placement Confirmation: ETT inserted through vocal cords under direct vision, positive ETCO2 and breath sounds checked- equal and bilateral Secured at: 21 cm Tube secured with: Tape Dental Injury: Teeth and Oropharynx as per pre-operative assessment

## 2022-09-09 NOTE — Hospital Course (Addendum)
Brief hospital course: PMH of CAD SP PCI, chronic A-fib, frequent V. tach SP AICD placement, CKD 3A presented to hospital with complaints of mechanical fall.  Found to have left hip fracture. Was originally presented in any pain hospital was transferred to [facility] for orthopedic evaluation.  Assessment and Plan: Mechanical fall. Left hip fracture. Originally presented to [facility]. Transferred to [place]. Underwent left intertrochanteric intramedullary nail placement on 8/24. Monitor postop recovery. Patient is relatively high risk for poor surgical outcome although appears to have done well.  Right knee tenderness Left wrist erythema. Will get x-rays for further workup. Patient apparently have issues with her right knee chronically and was recommended to go for surgery although she did not receive any preop cardiac clearance.  Therefore currently being managed conservatively.  Will get x-rays for further evaluation.  CKD 3A. Renal function stable. Monitor.  Chronic A-fib. On amiodarone, aspirin, Eliquis and Toprol-XL. Continue for now.  Hypothyroidism. Continue Synthroid for now.  Neuropathy. On gabapentin 300 mg. Continue.  Mood disorder. Currently stable.  Send continue Effexor.  HLD. Continue Crestor.  GERD. Continuing Protonix twice daily AC.  Hypokalemia. Currently being treated aggressively.  Chronic anemia with dilutional drop. H&H dropped from 11-10 likely secondary to dilution. Baseline appears to be around 10-11. At risk for postop anemia secondary to anticoagulation. Monitor.  Goals of care conversation. Patient had a DNR.  But reportedly told admitting provider that she wants to be a full code. Family at bedside corroborating this as well. Will continue to discuss goals

## 2022-09-09 NOTE — Discharge Instructions (Addendum)
INSTRUCTIONS AFTER SURGERY  Remove items at home which could result in a fall. This includes throw rugs or furniture in walking pathways ICE to the affected joint every three hours while awake for 30 minutes at a time, for at least the first 3-5 days, and then as needed for pain and swelling.  Continue to use ice for pain and swelling. You may notice swelling that will progress down to the foot and ankle.  This is normal after surgery.  Elevate your leg when you are not up walking on it.   Continue to use the breathing machine you got in the hospital (incentive spirometer) which will help keep your temperature down.  It is common for your temperature to cycle up and down following surgery, especially at night when you are not up moving around and exerting yourself.  The breathing machine keeps your lungs expanded and your temperature down.   DIET:  As you were doing prior to hospitalization, we recommend a well-balanced diet.  DRESSING / WOUND CARE / SHOWERING  Keep the surgical dressing until follow up.  The dressing is water proof, so you can shower without any extra covering.  IF THE DRESSING FALLS OFF or the wound gets wet inside, change the dressing with sterile gauze.  Please use good hand washing techniques before changing the dressing.  Do not use any lotions or creams on the incision until instructed by your surgeon.    ACTIVITY  Increase activity slowly as tolerated, but follow the weight bearing instructions below.   No driving for 6 weeks or until further direction given by your physician.  You cannot drive while taking narcotics.  No lifting or carrying greater than 10 lbs. until further directed by your surgeon. Avoid periods of inactivity such as sitting longer than an hour when not asleep. This helps prevent blood clots.  You may return to work once you are authorized by your doctor.     WEIGHT BEARING   Partial weight bearing with assist device as directed.      CONSTIPATION  Constipation is defined medically as fewer than three stools per week and severe constipation as less than one stool per week.  Even if you have a regular bowel pattern at home, your normal regimen is likely to be disrupted due to multiple reasons following surgery.  Combination of anesthesia, postoperative narcotics, change in appetite and fluid intake all can affect your bowels.   YOU MUST use at least one of the following options; they are listed in order of increasing strength to get the job done.  They are all available over the counter, and you may need to use some, POSSIBLY even all of these options:    Drink plenty of fluids (prune juice may be helpful) and high fiber foods Colace 100 mg by mouth twice a day  Senokot for constipation as directed and as needed Dulcolax (bisacodyl), take with full glass of water  Miralax (polyethylene glycol) once or twice a day as needed.  If you have tried all these things and are unable to have a bowel movement in the first 3-4 days after surgery call either your surgeon or your primary doctor.    If you experience loose stools or diarrhea, hold the medications until you stool forms back up.  If your symptoms do not get better within 1 week or if they get worse, check with your doctor.  If you experience "the worst abdominal pain ever" or develop nausea or vomiting, please   contact the office immediately for further recommendations for treatment.   ITCHING:  If you experience itching with your medications, try taking only a single pain pill, or even half a pain pill at a time.  You can also use Benadryl over the counter for itching or also to help with sleep.   TED HOSE STOCKINGS:  Use stockings on both legs until for at least 2 weeks or as directed by physician office. They may be removed at night for sleeping.  MEDICATIONS:  See your medication summary on the "After Visit Summary" that nursing will review with you.  You may have some  home medications which will be placed on hold until you complete the course of blood thinner medication.  It is important for you to complete the blood thinner medication as prescribed.  PRECAUTIONS:  If you experience chest pain or shortness of breath - call 911 immediately for transfer to the hospital emergency department.   If you develop a fever greater that 101 F, purulent drainage from wound, increased redness or drainage from wound, foul odor from the wound/dressing, or calf pain - CONTACT YOUR SURGEON.                                                   FOLLOW-UP APPOINTMENTS:  If you do not already have a post-op appointment, please call the office for an appointment to be seen by your surgeon.  Guidelines for how soon to be seen are listed in your "After Visit Summary", but are typically between 1-4 weeks after surgery.  POST-OPERATIVE OPIOID TAPER INSTRUCTIONS: It is important to wean off of your opioid medication as soon as possible. If you do not need pain medication after your surgery it is ok to stop day one. Opioids include: Codeine, Hydrocodone(Norco, Vicodin), Oxycodone(Percocet, oxycontin) and hydromorphone amongst others.  Long term and even short term use of opiods can cause: Increased pain response Dependence Constipation Depression Respiratory depression And more.  Withdrawal symptoms can include Flu like symptoms Nausea, vomiting And more Techniques to manage these symptoms Hydrate well Eat regular healthy meals Stay active Use relaxation techniques(deep breathing, meditating, yoga) Do Not substitute Alcohol to help with tapering If you have been on opioids for less than two weeks and do not have pain than it is ok to stop all together.  Plan to wean off of opioids This plan should start within one week post op of your joint replacement. Maintain the same interval or time between taking each dose and first decrease the dose.  Cut the total daily intake of  opioids by one tablet each day Next start to increase the time between doses. The last dose that should be eliminated is the evening dose.   MAKE SURE YOU:  Understand these instructions.  Get help right away if you are not doing well or get worse.    Thank you for letting us be a part of your medical care team.  It is a privilege we respect greatly.  We hope these instructions will help you stay on track for a fast and full recovery!      

## 2022-09-09 NOTE — Consult Note (Signed)
Reason for Consult: left hip fracture Referring Physician: Allena Katz, MD  [name] is an 89 y.o. female.  HPI: [name] is a 89 y.o. female with medical history significant of coronary artery disease status post stenting, chronic atrial fibrillation on anticoagulation and on amiodarone, stage III chronic kidney disease, catecholamine induced ventricular tachycardia with AICD presents.  Patient had a mechanical fall outside while supervising her grandson who was trimming the hedges.  She tried to sit down on her rolling walker and missed and fell on her left side.  She had immediate pain and was unable to ambulate.  She did appear out of it for a little while due to the pain.  Does not appear that she lost consciousness.  She continues to have pain in her left hip which is worse with movement.  No other injuries reported after this fall.  Past Medical History:  Diagnosis Date   AICD (automatic cardioverter/defibrillator) present    Allergy    SESONAL   Aneurysm of infrarenal abdominal aorta (HCC)    Anxiety    ARTHRITIS    Arthritis    Atrial fibrillation (HCC)    CAD (coronary artery disease)    a. history of cardiac arrest 1993;  b. s/p LAD/LCX stenting in 2003;  c. 07/2011 Cath: LM nl, LAD patent stent, D1 80ost, LCX patent stent, RCA min irregs;  d. 04/2012 MV: EF 66%, no ishcemia;  e. 12/2013 Echo: Ef 55%, no rwma, Gr 1 DD, triv AI/MR, mildly dil LA;  f. 12/2013 Lexi MV: intermediate risk - apical ischemia and inf/infsept fixed defect, ? artifactual.   CAD in native artery 12/31/2013   Previous stents to LAD and Ramus patent on cath today 12/31/13 also There is severe disease in the ostial first diagonal which is unchanged from most recent cardiac catheterization. The right coronary artery could not be engaged selectively but nonselective angiography showed no significant disease in the proximal and midsegment.       Cataract    DENIES   Chronic back pain    Chronic sinus  bradycardia    CKD (chronic kidney disease), stage III (HCC)    Clotting disorder (HCC)    DVT   COLITIS 12/02/2007   Qualifier: Diagnosis of  By: Koleen Distance CMA Duncan Dull), Hulan Saas     Diabetes mellitus without complication (HCC)    DENIES   Diverticulosis of colon (without mention of hemorrhage) 2007   Colonoscopy    Esophageal stricture    a. 2012 s/p dil.   Esophagitis, unspecified    a. 2012 EGD   EXTERNAL HEMORRHOIDS    GERD (gastroesophageal reflux disease)    omeprazole   H/O hiatal hernia    Hiatal hernia    a. 2012 EGD.   History of DVT in the past, not on Coumadin now    left  leg   HYPERCHOLESTEROLEMIA    Hypertension    ICD (implantable cardiac defibrillator) in place    a. s/p initial ICD in 1993 in setting of cardiac arrest;  b. 01/2007 gen change: Guidant T135 Vitality DS VR single lead ICD.   Macular degeneration    gets injecton in eye every 5 weeks- last injection - 05/03/2013    Myocardial infarction [facility]) 1993   Near syncope 05/22/2015   Nephrolithiasis, just saw Dr Annabell Howells- "OK"    Orthostatic hypotension    Peripheral vascular disease (HCC)    ???   RECTAL BLEEDING 12/03/2007   Qualifier: Diagnosis of  By:  Jarold Motto MD Mervyn Skeeters R    Unspecified gastritis and gastroduodenitis without mention of hemorrhage    a. 2003 EGD->not noted on 2012 EGD.   Unstable angina, neg MI, cath stable.maybe GI 12/30/2013    Past Surgical History:  Procedure Laterality Date   BREAST BIOPSY Left 11/2013   CARDIAC CATHETERIZATION     CARDIAC CATHETERIZATION  01/01/15   patent stents -there is severe disease in ostial 1st diag but without change.    CARDIAC DEFIBRILLATOR PLACEMENT  02/05/2007   Guidant   CARDIOVASCULAR STRESS TEST  12/30/2013   abnormal   CARDIOVERSION N/A 02/13/2019   Procedure: CARDIOVERSION;  Surgeon: Thurmon Fair, MD;  Location: [facility] ENDOSCOPY;  Service: Cardiovascular;  Laterality: N/A;   CATARACT EXTRACTION Right    Dr. Dione Booze   COLONOSCOPY      COLONOSCOPY WITH PROPOFOL N/A 03/30/2020   Procedure: COLONOSCOPY WITH PROPOFOL;  Surgeon: Dolores Frame, MD;  Location: [facility];  Service: Gastroenterology;  Laterality: N/A;   coronary stents      CYSTOSCOPY WITH URETEROSCOPY Right 05/20/2013   Procedure: CYSTOSCOPY, RIGHT URETEROSCOPY STONE EXTRACTION, Insertion of right DOUBLE J STENT ;  Surgeon: Bjorn Pippin, MD;  Location: [facility] ORS;  Service: Urology;  Laterality: Right;   CYSTOSCOPY WITH URETEROSCOPY AND STENT PLACEMENT N/A 06/03/2013   Procedure: SECOND LOOK CYSTOSCOPY WITH URETEROSCOPY  HOLMIUM LASER LITHO AND STONE EXTRACTION Newman Nip ;  Surgeon: Anner Crete, MD;  Location: [facility] ORS;  Service: Urology;  Laterality: N/A;   ESOPHAGOGASTRODUODENOSCOPY (EGD) WITH PROPOFOL N/A 11/20/2019   Procedure: ESOPHAGOGASTRODUODENOSCOPY (EGD) WITH PROPOFOL;  Surgeon: Corbin Ade, MD;  Location: [facility];  Service: Endoscopy;  Laterality: N/A;   GIVENS CAPSULE STUDY N/A 10/30/2016   Procedure: GIVENS CAPSULE STUDY;  Surgeon: Hilarie Fredrickson, MD;  Location: [facility] ENDOSCOPY;  Service: Endoscopy;  Laterality: N/A;  NEEDS TO BE ADMITTED FOR OBSERVATION PT. HAS DEFIB   HEMORROIDECTOMY  80's   HOLMIUM LASER APPLICATION Right 05/20/2013   Procedure: HOLMIUM LASER APPLICATION;  Surgeon: Bjorn Pippin, MD;  Location: [facility] ORS;  Service: Urology;  Laterality: Right;   HYSTEROSCOPY WITH D & C  09/02/2010   Procedure: DILATATION AND CURETTAGE (D&C) /HYSTEROSCOPY;  Surgeon: Meriel Pica;  Location: [facility] ORS;  Service: Gynecology;  Laterality: N/A;  Dilation and Curettage with Hysteroscopy and Polypectomy   IMPLANTABLE CARDIOVERTER DEFIBRILLATOR (ICD) GENERATOR CHANGE N/A 02/10/2014   Procedure: ICD GENERATOR CHANGE;  Surgeon: Thurmon Fair, MD;  Location: [facility] CATH LAB;  Service: Cardiovascular;  Laterality: N/A;   LEFT HEART CATHETERIZATION WITH CORONARY ANGIOGRAM N/A 07/28/2011   Procedure: LEFT HEART CATHETERIZATION WITH CORONARY ANGIOGRAM;  Surgeon: Runell Gess, MD;  Location: [facility] CATH LAB;  Service: Cardiovascular;  Laterality: N/A;   LEFT HEART CATHETERIZATION WITH CORONARY ANGIOGRAM N/A 12/31/2013   Procedure: LEFT HEART CATHETERIZATION WITH CORONARY ANGIOGRAM;  Surgeon: Iran Ouch, MD;  Location: [facility] CATH LAB;  Service: Cardiovascular;  Laterality: N/A;    Family History  Problem Relation Age of Onset   Stroke Mother    Other Mother        brain tumor   Other Father        MI   Heart attack Father    Stroke Sister    Macular degeneration Sister    Diabetes Daughter    Cancer Daughter        ovarian   Colon polyps Daughter    Atrial fibrillation Sister    Hyperlipidemia Sister  Osteoporosis Sister    Stroke Sister    Uterine cancer Sister    Heart attack Brother    Heart disease Brother    Asthma Brother    Breast cancer Neg Hx     Social History:  reports that she has never smoked. She has never used smokeless tobacco. She reports that she does not drink alcohol and does not use drugs.  Allergies:  Allergies  Allergen Reactions   Sotalol Other (See Comments)    Torsades    Tramadol Other (See Comments)    Hallucination, bad-dream   Actos [Pioglitazone] Swelling   Adhesive [Tape] Other (See Comments)    Causes sores   Atorvastatin Other (See Comments)    myalgia   Bactrim [Sulfamethoxazole-Trimethoprim] Itching and Rash    Itchy rash   Ciprofloxacin Nausea Only   Contrast Media [Iodinated Contrast Media] Other (See Comments)    Headache (no action/pre-med required)   Latex Rash   Pedi-Pre Tape Spray [Wound Dressing Adhesive] Other (See Comments)    Causes sores   Sulfa Antibiotics Other (See Comments)    Causes sores    Medications: I have reviewed the patient's current medications. Scheduled:  amiodarone  200 mg Oral Daily   amLODipine  2.5 mg Oral Daily   feeding supplement  237 mL Oral BID BM   gabapentin  100 mg Oral QHS   isosorbide mononitrate  60 mg Oral Daily   latanoprost  1 drop Left  Eye QHS   levothyroxine  112 mcg Oral Q0600   metoprolol succinate  12.5 mg Oral BID   pantoprazole  40 mg Oral BID AC   potassium chloride  40 mEq Oral Once   rosuvastatin  10 mg Oral Daily   senna  1 tablet Oral BID   venlafaxine XR  37.5 mg Oral Q breakfast    Results for orders placed or performed during the hospital encounter of 09/08/22 (from the past 24 hour(s))  CBG monitoring, ED     Status: Abnormal   Collection Time: 09/08/22  5:11 PM  Result Value Ref Range   Glucose-Capillary 122 (H) 70 - 99 mg/dL  Basic metabolic panel     Status: Abnormal   Collection Time: 09/08/22  5:40 PM  Result Value Ref Range   Sodium 134 (L) 135 - 145 mmol/L   Potassium 3.4 (L) 3.5 - 5.1 mmol/L   Chloride 101 98 - 111 mmol/L   CO2 24 22 - 32 mmol/L   Glucose, Bld 145 (H) 70 - 99 mg/dL   BUN 22 8 - 23 mg/dL   Creatinine, Ser 0.10 (H) 0.44 - 1.00 mg/dL   Calcium 8.5 (L) 8.9 - 10.3 mg/dL   GFR, Estimated 31 (L) >60 mL/min   Anion gap 9 5 - 15  CBC     Status: Abnormal   Collection Time: 09/08/22  5:40 PM  Result Value Ref Range   WBC 11.0 (H) 4.0 - 10.5 K/uL   RBC 3.66 (L) 3.87 - 5.11 MIL/uL   Hemoglobin 11.4 (L) 12.0 - 15.0 g/dL   HCT 27.2 (L) 53.6 - 64.4 %   MCV 94.0 80.0 - 100.0 fL   MCH 31.1 26.0 - 34.0 pg   MCHC 33.1 30.0 - 36.0 g/dL   RDW 03.4 74.2 - 59.5 %   Platelets 247 150 - 400 K/uL   nRBC 0.2 0.0 - 0.2 %  Urinalysis, Routine w reflex microscopic -Urine, Catheterized     Status: Abnormal   Collection  Time: 09/08/22  8:38 PM  Result Value Ref Range   Color, Urine YELLOW YELLOW   APPearance HAZY (A) CLEAR   Specific Gravity, Urine 1.005 1.005 - 1.030   pH 6.0 5.0 - 8.0   Glucose, UA NEGATIVE NEGATIVE mg/dL   Hgb urine dipstick LARGE (A) NEGATIVE   Bilirubin Urine NEGATIVE NEGATIVE   Ketones, ur NEGATIVE NEGATIVE mg/dL   Protein, ur 30 (A) NEGATIVE mg/dL   Nitrite NEGATIVE NEGATIVE   Leukocytes,Ua SMALL (A) NEGATIVE   RBC / HPF >50 0 - 5 RBC/hpf   WBC, UA 6-10 0 - 5  WBC/hpf   Bacteria, UA MANY (A) NONE SEEN   Squamous Epithelial / HPF 0-5 0 - 5 /HPF   Mucus PRESENT   Surgical pcr screen     Status: None   Collection Time: 09/08/22 10:06 PM   Specimen: Nasal Mucosa; Nasal Swab  Result Value Ref Range   MRSA, PCR NEGATIVE NEGATIVE   Staphylococcus aureus NEGATIVE NEGATIVE  Basic metabolic panel     Status: Abnormal   Collection Time: 09/09/22  3:58 AM  Result Value Ref Range   Sodium 136 135 - 145 mmol/L   Potassium 3.0 (L) 3.5 - 5.1 mmol/L   Chloride 104 98 - 111 mmol/L   CO2 24 22 - 32 mmol/L   Glucose, Bld 140 (H) 70 - 99 mg/dL   BUN 24 (H) 8 - 23 mg/dL   Creatinine, Ser 0.27 (H) 0.44 - 1.00 mg/dL   Calcium 8.5 (L) 8.9 - 10.3 mg/dL   GFR, Estimated 31 (L) >60 mL/min   Anion gap 8 5 - 15  CBC     Status: Abnormal   Collection Time: 09/09/22  3:58 AM  Result Value Ref Range   WBC 8.0 4.0 - 10.5 K/uL   RBC 3.25 (L) 3.87 - 5.11 MIL/uL   Hemoglobin 10.0 (L) 12.0 - 15.0 g/dL   HCT 25.3 (L) 66.4 - 40.3 %   MCV 96.0 80.0 - 100.0 fL   MCH 30.8 26.0 - 34.0 pg   MCHC 32.1 30.0 - 36.0 g/dL   RDW 47.4 25.9 - 56.3 %   Platelets 215 150 - 400 K/uL   nRBC 0.3 (H) 0.0 - 0.2 %  Protime-INR     Status: Abnormal   Collection Time: 09/09/22  3:58 AM  Result Value Ref Range   Prothrombin Time 15.3 (H) 11.4 - 15.2 seconds   INR 1.2 0.8 - 1.2   *Note: Due to a large number of results and/or encounters for the requested time period, some results have not been displayed. A complete set of results can be found in Results Review.     X-ray: CLINICAL DATA:  Pain after fall   EXAM: DG HIP (WITH OR WITHOUT PELVIS) 3V LEFT   COMPARISON:  None Available.   FINDINGS: Severe osteopenia. There is a angulated comminuted intertrochanteric left hip fracture with involvement specifically of the lesser trochanter. No additional fracture or dislocation. Degenerative changes seen of the sacroiliac joints and pubic symphysis. Vascular calcifications are seen.    IMPRESSION: Severe osteopenia with a angulated comminuted and foreshortened intertrochanteric left hip fracture.     Electronically Signed   By: Karen Kays M.D.  ROS: As per her admitting HPI  Blood pressure (!) 99/53, pulse 61, temperature 97.7 F (36.5 C), temperature source Oral, resp. rate 17, height 5\' 3"  (1.6 m), weight 53.5 kg, SpO2 96%.  Physical Exam: General: elderly female. Awake and alert and oriented  x3. No acute cardiopulmonary distress.  HEENT: Normocephalic atraumatic.  Right and left ears normal in appearance.  Pupils equal, round, reactive to light. Extraocular muscles are intact. Sclerae anicteric and noninjected.  Moist mucosal membranes. No mucosal lesions.  Neck: Neck supple without lymphadenopathy. No carotid bruits. No masses palpated.  Cardiovascular: Regular rate with normal S1-S2 sounds. No murmurs, rubs, gallops auscultated. No JVD.  Respiratory: Good respiratory effort with no wheezes, rales, rhonchi. Lungs clear to auscultation bilaterally.  No accessory muscle use. Abdomen: Soft, nontender, nondistended. Active bowel sounds. No masses or hepatosplenomegaly  Skin: No rashes, lesions, or ulcerations.  Dry, warm to touch. 2+ dorsalis pedis and radial pulses. Musculoskeletal: Left leg shortened and externally rotated.  Neurovascularly intact distally..  No contractures  Psychiatric: Intact judgment and insight. Pleasant and cooperative. Neurologic: No focal neurological deficits. Strength is 5/5 and symmetric in upper and lower extremities.  Cranial nerves II through XII are grossly intact.  Assessment/Plan: Left intertrochanteric hp fracture  Plan: She will need operative repair of fracture for pain control and mobility I am going to get this addressed today NPO Consent ordered Post op orders and instructions to follow procedure  Shelda Pal 09/09/2022, 7:48 AM

## 2022-09-09 NOTE — Transfer of Care (Signed)
Immediate Anesthesia Transfer of Care Note  Patient: [name]  Procedure(s) Performed: INTRAMEDULLARY (IM) NAIL INTERTROCHANTERIC (Left: Hip)  Patient Location: PACU  Anesthesia Type:General  Level of Consciousness: sedated  Airway & Oxygen Therapy: Patient Spontanous Breathing and Patient connected to face mask oxygen  Post-op Assessment: Report given to RN and Post -op Vital signs reviewed and stable  Post vital signs: Reviewed  Last Vitals:  Vitals Value Taken Time  BP    Temp    Pulse    Resp 13 09/09/22 1342  SpO2    Vitals shown include unfiled device data.  Last Pain:  Vitals:   09/09/22 1211  TempSrc:   PainSc: 0-No pain      Patients Stated Pain Goal: 0 (09/09/22 0800)  Complications: No notable events documented.

## 2022-09-10 DIAGNOSIS — S72142A Displaced intertrochanteric fracture of left femur, initial encounter for closed fracture: Secondary | ICD-10-CM | POA: Diagnosis not present

## 2022-09-10 LAB — BASIC METABOLIC PANEL
Anion gap: 11 (ref 5–15)
BUN: 29 mg/dL — ABNORMAL HIGH (ref 8–23)
CO2: 21 mmol/L — ABNORMAL LOW (ref 22–32)
Calcium: 8.3 mg/dL — ABNORMAL LOW (ref 8.9–10.3)
Chloride: 96 mmol/L — ABNORMAL LOW (ref 98–111)
Creatinine, Ser: 1.93 mg/dL — ABNORMAL HIGH (ref 0.44–1.00)
GFR, Estimated: 25 mL/min — ABNORMAL LOW (ref >60)
Glucose, Bld: 235 mg/dL — ABNORMAL HIGH (ref 70–99)
Potassium: 3.2 mmol/L — ABNORMAL LOW (ref 3.5–5.1)
Sodium: 128 mmol/L — ABNORMAL LOW (ref 135–145)

## 2022-09-10 LAB — CBC
HCT: 30.5 % — ABNORMAL LOW (ref 36.0–46.0)
Hemoglobin: 9.7 g/dL — ABNORMAL LOW (ref 12.0–15.0)
MCH: 31.5 pg (ref 26.0–34.0)
MCHC: 31.8 g/dL (ref 30.0–36.0)
MCV: 99 fL (ref 80.0–100.0)
Platelets: 179 10*3/uL (ref 150–400)
RBC: 3.08 MIL/uL — ABNORMAL LOW (ref 3.87–5.11)
RDW: 14.4 % (ref 11.5–15.5)
WBC: 12.1 10*3/uL — ABNORMAL HIGH (ref 4.0–10.5)
nRBC: 0.5 % — ABNORMAL HIGH (ref 0.0–0.2)

## 2022-09-10 LAB — MAGNESIUM: Magnesium: 1.9 mg/dL (ref 1.7–2.4)

## 2022-09-10 MED ORDER — VENLAFAXINE HCL ER 37.5 MG PO CP24
37.5000 mg | ORAL_CAPSULE | Freq: Every day | ORAL | Status: DC
  Administered 2022-09-11 – 2022-09-12 (×2): 37.5 mg via ORAL
  Filled 2022-09-10 (×2): qty 1

## 2022-09-10 MED ORDER — APIXABAN 2.5 MG PO TABS
2.5000 mg | ORAL_TABLET | Freq: Two times a day (BID) | ORAL | Status: DC
  Administered 2022-09-10 – 2022-09-11 (×3): 2.5 mg via ORAL
  Filled 2022-09-10 (×3): qty 1

## 2022-09-10 MED ORDER — POTASSIUM CHLORIDE CRYS ER 20 MEQ PO TBCR
40.0000 meq | EXTENDED_RELEASE_TABLET | Freq: Once | ORAL | Status: AC
  Administered 2022-09-10: 40 meq via ORAL
  Filled 2022-09-10: qty 2

## 2022-09-10 NOTE — Progress Notes (Signed)
Physical Therapy Treatment Patient Details Name: [name] MRN: [id number] DOB: April 28, 1931 Today's Date: 09/10/2022   History of Present Illness Pt s/p fall at home with L hip fx and now s/p IM nailing.  Pt with hx o fCAD, CKD, AICD and chronic a-fib.    PT Comments  Pt continues very cooperative and assisted up from chair to [facility] and up from [facility] to ambulate very limited distance to transfer to bed.  Pt continues to require significant assist of 2 for safe performance of all basic bed mobility tasks and Patient will benefit from continued inpatient follow up therapy, <3 hours/day.    If plan is discharge home, recommend the following: A lot of help with walking and/or transfers;A little help with bathing/dressing/bathroom;Assistance with cooking/housework;Assist for transportation;Help with stairs or ramp for entrance   Can travel by private vehicle     No  Equipment Recommendations  None recommended by PT    Recommendations for Other Services       Precautions / Restrictions Precautions Precautions: Fall Restrictions Weight Bearing Restrictions: No LLE Weight Bearing: Weight bearing as tolerated     Mobility  Bed Mobility Overal bed mobility: Needs Assistance Bed Mobility: Sit to Supine Rolling: Mod assist   Supine to sit: Mod assist     General bed mobility comments: cues for sequence and use of R LE to self assist`    Transfers Overall transfer level: Needs assistance Equipment used: Rolling walker (2 wheels) Transfers: Sit to/from Stand, Bed to chair/wheelchair/BSC Sit to Stand: Min assist, Mod assist, +2 physical assistance, +2 safety/equipment, From elevated surface   Step pivot transfers: Min assist, Mod assist, +2 physical assistance, +2 safety/equipment, From elevated surface       General transfer comment: cues for LE management and use of UEs to self assist. Step pvt bed to chair    Ambulation/Gait Ambulation/Gait assistance: Min assist, Mod  assist, +2 physical assistance, +2 safety/equipment Gait Distance (Feet): 2 Feet Assistive device: Rolling walker (2 wheels) Gait Pattern/deviations: Step-to pattern, Decreased step length - right, Decreased step length - left, Shuffle, Trunk flexed       General Gait Details: Increased time with cues for sequence, posture and position from [employer]             Wheelchair Mobility     Tilt Bed    Modified Rankin (Stroke Patients Only)       Balance Overall balance assessment: Needs assistance Sitting-balance support: Feet supported, No upper extremity supported Sitting balance-Leahy Scale: Fair     Standing balance support: Bilateral upper extremity supported Standing balance-Leahy Scale: Poor                              Cognition Arousal: Alert Behavior During Therapy: WFL for tasks assessed/performed Overall Cognitive Status: Within Functional Limits for tasks assessed                                          Exercises      General Comments        Pertinent Vitals/Pain Pain Assessment Pain Assessment: 0-10 Pain Score: 4  Pain Location: L hip Pain Descriptors / Indicators: Aching, Sore, Grimacing, Guarding Pain Intervention(s): Limited activity within patient's tolerance, Monitored during session, Premedicated before session    Home Living Family/patient expects to be  discharged to:: Unsure Living Arrangements: Alone;Other (Comment)                 Additional Comments: Family lives across the street; has aide MOnday to Friday but not 24/7    Prior Function            PT Goals (current goals can now be found in the care plan section) Acute Rehab PT Goals Patient Stated Goal: Regain IND PT Goal Formulation: With patient Time For Goal Achievement: 09/24/22 Potential to Achieve Goals: Fair Progress towards PT goals: Progressing toward goals    Frequency    Min 1X/week      PT Plan       Co-evaluation              AM-PAC PT "6 Clicks" Mobility   Outcome Measure  Help needed turning from your back to your side while in a flat bed without using bedrails?: A Lot Help needed moving from lying on your back to sitting on the side of a flat bed without using bedrails?: A Lot Help needed moving to and from a bed to a chair (including a wheelchair)?: A Lot Help needed standing up from a chair using your arms (e.g., wheelchair or bedside chair)?: A Lot Help needed to walk in hospital room?: Total Help needed climbing 3-5 steps with a railing? : Total 6 Click Score: 10    End of Session Equipment Utilized During Treatment: Gait belt Activity Tolerance: Patient tolerated treatment well;Patient limited by fatigue Patient left: with call bell/phone within reach;in bed;with bed alarm set Nurse Communication: Mobility status PT Visit Diagnosis: Difficulty in walking, not elsewhere classified (R26.2)     Time: 1117-1140 PT Time Calculation (min) (ACUTE ONLY): 23 min  Charges:    $Gait Training: 8-22 mins $Therapeutic Activity: 8-22 mins PT General Charges $$ ACUTE PT VISIT: 1 Visit                     Mauro Kaufmann PT Acute Rehabilitation Services Pager [phone] Office [phone]    [name] 09/10/2022, 2:02 PM

## 2022-09-10 NOTE — Evaluation (Signed)
Physical Therapy Evaluation Patient Details Name: [name] MRN: [id number] DOB: 23-Mar-1931 Today's Date: 09/10/2022  History of Present Illness  Pt s/p fall at home with L hip fx and now s/p IM nailing.  Pt with hx o fCAD, CKD, AICD and chronic a-fib.  Clinical Impression  Pt admitted as above and presenting with functional mobility limitations 2* generalized weakness, decreased L LE strength/ROM, and post op pain.  Pt currently requiring significant assist for all basic mobility tasks and will benefit from continued inpatient follow up therapy, <3 hours/day to maximize IND and safety prior to return home.       If plan is discharge home, recommend the following: A lot of help with walking and/or transfers;A little help with bathing/dressing/bathroom;Assistance with cooking/housework;Assist for transportation;Help with stairs or ramp for entrance   Can travel by private vehicle   No    Equipment Recommendations None recommended by PT  Recommendations for Other Services       Functional Status Assessment Patient has had a recent decline in their functional status and demonstrates the ability to make significant improvements in function in a reasonable and predictable amount of time.     Precautions / Restrictions Precautions Precautions: Fall Restrictions Weight Bearing Restrictions: No LLE Weight Bearing: Weight bearing as tolerated      Mobility  Bed Mobility Overal bed mobility: Needs Assistance Bed Mobility: Rolling, Supine to Sit Rolling: Mod assist   Supine to sit: Mod assist     General bed mobility comments: cues for sequence and use of R LE to self assist`    Transfers Overall transfer level: Needs assistance Equipment used: Rolling walker (2 wheels) Transfers: Sit to/from Stand, Bed to chair/wheelchair/BSC Sit to Stand: Min assist, Mod assist, +2 physical assistance, +2 safety/equipment, From elevated surface   Step pivot transfers: Min assist, Mod  assist, +2 physical assistance, +2 safety/equipment, From elevated surface       General transfer comment: cues for LE management and use of UEs to self assist. Step pvt bed to chair    Ambulation/Gait               General Gait Details: step pvt bed to chair only 2* c/o dizziness  Stairs            Wheelchair Mobility     Tilt Bed    Modified Rankin (Stroke Patients Only)       Balance Overall balance assessment: Needs assistance Sitting-balance support: Feet supported, No upper extremity supported Sitting balance-Leahy Scale: Fair     Standing balance support: Bilateral upper extremity supported Standing balance-Leahy Scale: Poor                               Pertinent Vitals/Pain Pain Assessment Pain Assessment: 0-10 Pain Score: 5  Pain Location: L hip Pain Descriptors / Indicators: Aching, Sore, Grimacing, Guarding Pain Intervention(s): Limited activity within patient's tolerance, Monitored during session, Premedicated before session, Ice applied    Home Living Family/patient expects to be discharged to:: Unsure Living Arrangements: Alone;Other (Comment)                 Additional Comments: Family lives across the street; has aide MOnday to Friday but not 24/7    Prior Function Prior Level of Function : Needs assist             Mobility Comments: Pt reports use of rollator vs cane  Extremity/Trunk Assessment   Upper Extremity Assessment Upper Extremity Assessment: Generalized weakness    Lower Extremity Assessment Lower Extremity Assessment: Generalized weakness;LLE deficits/detail    Cervical / Trunk Assessment Cervical / Trunk Assessment: Kyphotic  Communication   Communication Communication: Hearing impairment Cueing Techniques: Verbal cues;Gestural cues;Tactile cues  Cognition Arousal: Alert Behavior During Therapy: WFL for tasks assessed/performed Overall Cognitive Status: Within Functional Limits  for tasks assessed                                          General Comments      Exercises     Assessment/Plan    PT Assessment Patient needs continued PT services  PT Problem List Decreased strength;Decreased range of motion;Decreased activity tolerance;Decreased balance;Decreased mobility;Decreased cognition;Decreased knowledge of use of DME;Pain       PT Treatment Interventions DME instruction;Gait training;Stair training;Therapeutic activities;Functional mobility training;Therapeutic exercise;Patient/family education;Balance training    PT Goals (Current goals can be found in the Care Plan section)  Acute Rehab PT Goals Patient Stated Goal: Regain IND PT Goal Formulation: With patient Time For Goal Achievement: 09/24/22 Potential to Achieve Goals: Fair    Frequency Min 1X/week     Co-evaluation               AM-PAC PT "6 Clicks" Mobility  Outcome Measure Help needed turning from your back to your side while in a flat bed without using bedrails?: A Lot Help needed moving from lying on your back to sitting on the side of a flat bed without using bedrails?: A Lot Help needed moving to and from a bed to a chair (including a wheelchair)?: A Lot Help needed standing up from a chair using your arms (e.g., wheelchair or bedside chair)?: A Lot Help needed to walk in hospital room?: Total Help needed climbing 3-5 steps with a railing? : Total 6 Click Score: 10    End of Session Equipment Utilized During Treatment: Gait belt Activity Tolerance: Patient tolerated treatment well;Patient limited by fatigue;Other (comment) (orthostatic) Patient left: in chair;with call bell/phone within reach;with chair alarm set;with nursing/sitter in room Nurse Communication: Mobility status PT Visit Diagnosis: Difficulty in walking, not elsewhere classified (R26.2)    Time: 1020-1053 PT Time Calculation (min) (ACUTE ONLY): 33 min   Charges:   PT Evaluation $PT  Eval Low Complexity: 1 Low PT Treatments $Therapeutic Activity: 8-22 mins PT General Charges $$ ACUTE PT VISIT: 1 Visit         Mauro Kaufmann PT Acute Rehabilitation Services Pager [phone] Office [phone]   Jamus Loving 09/10/2022, 12:26 PM

## 2022-09-10 NOTE — Progress Notes (Addendum)
Patient ID: [name], female   DOB: 12-10-1931, 89 y.o.   MRN: [id number] Subjective: 1 Day Post-Op Procedure(s) (LRB): INTRAMEDULLARY (IM) NAIL INTERTROCHANTERIC (Left)    Patient reports pain as mild to moderate but hasn't performed much activity Worried about urine output and her chronic kidney insufficiency   Objective:   VITALS:   Vitals:   09/10/22 0133 09/10/22 0525  BP: (!) 146/73 (!) 141/77  Pulse: 64 63  Resp: 17 18  Temp: 98.3 F (36.8 C) 98.2 F (36.8 C)  SpO2: 100% 98%    Neurovascular intact Incision: dressing C/D/I - left lateral hip wounds  LABS Recent Labs    09/08/22 1740 09/09/22 0358 09/10/22 0346  HGB 11.4* 10.0* 9.7*  HCT 34.4* 31.2* 30.5*  WBC 11.0* 8.0 12.1*  PLT 247 215 179    Recent Labs    09/08/22 1740 09/09/22 0358  NA 134* 136  K 3.4* 3.0*  BUN 22 24*  CREATININE 1.58* 1.60*  GLUCOSE 145* 140*    Recent Labs    09/09/22 0358  INR 1.2     Assessment/Plan: 1 Day Post-Op Procedure(s) (LRB): INTRAMEDULLARY (IM) NAIL INTERTROCHANTERIC (Left)   Advance diet Up with therapy Discussed starting to move around in the bed a bit to get used to moving her left LE Resumed her Eliquis (chronic use but now also for DVT prophylaxis) Oxycodone for pain control at this point GI motility medications ordered  PT - PWB LLE

## 2022-09-10 NOTE — Progress Notes (Signed)
[facility] Progress Note Patient: [name]:[id number] DOB: 31-Jul-1931 DOA: 09/08/2022  DOS: the patient was seen and examined on 09/10/2022  Brief hospital course: PMH of CAD SP PCI, chronic A-fib, frequent V. tach SP AICD placement, CKD 3A presented to hospital with complaints of mechanical fall.  Found to have left hip fracture. Was originally presented in [facility], was transferred [facility]opedic evaluation.  Assessment and Plan: Mechanical fall. Left hip fracture. Originally presented to [facility]. Transferred to [facility]. Underwent left intertrochanteric intramedullary nail placement on 8/24. Monitor postop recovery. Patient is relatively high risk for poor surgical outcome although appears to have done well.  Right knee tenderness Left wrist erythema. X-ray of wrist as well as right knee negative for any acute fracture. Evidence of arthritis seen.  CKD 3A. Baseline serum creatinine appears to be ranging between 1.3-1.9. Based on that currently renal function stable. Monitor.  Chronic A-fib. On amiodarone, aspirin, Eliquis and Toprol-XL. Continue for now.  Reported history of prolonged QTc. QT on EKG is prolonged but corrected QT for wide QRS complex is 430. For now continue to use current medication including venlafaxine and amiodarone.  Hypothyroidism. Continue Synthroid for now.  Neuropathy. On gabapentin 300 mg. Continue at 100 mg nightly for now.  Mood disorder. Currently stable.   continue Effexor.  HLD. Continue Crestor.  GERD. Continuing Protonix twice daily AC.  Hypokalemia. Currently being treated aggressively.  Chronic anemia with dilutional drop. H&H dropped from 11-10 likely secondary to dilution. Baseline appears to be around 10-11. At risk for postop anemia secondary to anticoagulation. Monitor.  Hypokalemia. Will correct.  Hyponatremia. Monitor for now.  Goals of care conversation. Patient  had a DNR.  But reportedly told admitting provider that she wants to be a full code. Family at bedside corroborating this as well. Will continue to discuss goals   Subjective: No nausea no vomiting no fever no chills.  Pain well-controlled.  Physical Exam: General: in Mild distress, No Rash Cardiovascular: S1 and S2 Present, No Murmur Respiratory: Good respiratory effort, Bilateral Air entry present. No Crackles, No wheezes Abdomen: Bowel Sound present, No tenderness Extremities: No edema Neuro: Alert and oriented x3, no new focal deficit  Data Reviewed: I have Reviewed nursing notes, Vitals, and Lab results. Since last encounter, pertinent lab results CBC and BMP   . I have ordered test including CBC and BMP. I have independently visualized and interpreted EKG which showed EKG: prolonged QT interval.   Disposition: Status is: Inpatient Remains inpatient appropriate because: Stabilization of serum creatinine and potassium level  apixaban (ELIQUIS) tablet 2.5 mg Start: 09/10/22 1245 SCDs Start: 09/09/22 1525 SCDs Start: 09/08/22 2205 apixaban (ELIQUIS) tablet 2.5 mg   Family Communication: No one at bedside Level of care: Telemetry continue due to QT monitoring requirement Vitals:   09/10/22 0525 09/10/22 0920 09/10/22 1424 09/10/22 1811  BP: (!) 141/77 (!) 144/57 (!) 132/53 128/62  Pulse: 63 60 (!) 53 (!) 57  Resp: 18 16 20 20   Temp: 98.2 F (36.8 C) 98.2 F (36.8 C) 98.7 F (37.1 C) 98.2 F (36.8 C)  TempSrc: Oral Oral Oral Oral  SpO2: 98% 99% 98% 98%  Weight:      Height:         Author: Lynden Oxford, MD 09/10/2022 6:20 PM  Please look on [email or web address] to find out who is on call.

## 2022-09-11 ENCOUNTER — Encounter (HOSPITAL_COMMUNITY): Payer: Self-pay | Admitting: Orthopedic Surgery

## 2022-09-11 DIAGNOSIS — S72002A Fracture of unspecified part of neck of left femur, initial encounter for closed fracture: Secondary | ICD-10-CM | POA: Diagnosis not present

## 2022-09-11 LAB — TYPE AND SCREEN
ABO/RH(D): B POS
Antibody Screen: NEGATIVE

## 2022-09-11 LAB — BASIC METABOLIC PANEL
Anion gap: 7 (ref 5–15)
Anion gap: 12 (ref 5–15)
BUN: 34 mg/dL — ABNORMAL HIGH (ref 8–23)
BUN: 32 mg/dL — ABNORMAL HIGH (ref 8–23)
CO2: 23 mmol/L (ref 22–32)
CO2: 22 mmol/L (ref 22–32)
Calcium: 8.1 mg/dL — ABNORMAL LOW (ref 8.9–10.3)
Calcium: 8.6 mg/dL — ABNORMAL LOW (ref 8.9–10.3)
Chloride: 99 mmol/L (ref 98–111)
Chloride: 97 mmol/L — ABNORMAL LOW (ref 98–111)
Creatinine, Ser: 2.05 mg/dL — ABNORMAL HIGH (ref 0.44–1.00)
Creatinine, Ser: 1.96 mg/dL — ABNORMAL HIGH (ref 0.44–1.00)
GFR, Estimated: 23 mL/min — ABNORMAL LOW (ref >60)
GFR, Estimated: 24 mL/min — ABNORMAL LOW (ref >60)
Glucose, Bld: 127 mg/dL — ABNORMAL HIGH (ref 70–99)
Glucose, Bld: 159 mg/dL — ABNORMAL HIGH (ref 70–99)
Potassium: 4.2 mmol/L (ref 3.5–5.1)
Potassium: 4 mmol/L (ref 3.5–5.1)
Sodium: 129 mmol/L — ABNORMAL LOW (ref 135–145)
Sodium: 131 mmol/L — ABNORMAL LOW (ref 135–145)

## 2022-09-11 LAB — CBC
HCT: 24.6 % — ABNORMAL LOW (ref 36.0–46.0)
Hemoglobin: 8.1 g/dL — ABNORMAL LOW (ref 12.0–15.0)
MCH: 31.3 pg (ref 26.0–34.0)
MCHC: 32.9 g/dL (ref 30.0–36.0)
MCV: 95 fL (ref 80.0–100.0)
Platelets: 181 10*3/uL (ref 150–400)
RBC: 2.59 MIL/uL — ABNORMAL LOW (ref 3.87–5.11)
RDW: 14.5 % (ref 11.5–15.5)
WBC: 8.5 10*3/uL (ref 4.0–10.5)
nRBC: 0.8 % — ABNORMAL HIGH (ref 0.0–0.2)

## 2022-09-11 LAB — CBC WITH DIFFERENTIAL/PLATELET
Abs Immature Granulocytes: 0.05 10*3/uL (ref 0.00–0.07)
Basophils Absolute: 0 10*3/uL (ref 0.0–0.1)
Basophils Relative: 1 %
Eosinophils Absolute: 0.2 10*3/uL (ref 0.0–0.5)
Eosinophils Relative: 3 %
HCT: 22.2 % — ABNORMAL LOW (ref 36.0–46.0)
Hemoglobin: 7.3 g/dL — ABNORMAL LOW (ref 12.0–15.0)
Immature Granulocytes: 1 %
Lymphocytes Relative: 13 %
Lymphs Abs: 1.1 10*3/uL (ref 0.7–4.0)
MCH: 31.5 pg (ref 26.0–34.0)
MCHC: 32.9 g/dL (ref 30.0–36.0)
MCV: 95.7 fL (ref 80.0–100.0)
Monocytes Absolute: 0.8 10*3/uL (ref 0.1–1.0)
Monocytes Relative: 10 %
Neutro Abs: 5.9 10*3/uL (ref 1.7–7.7)
Neutrophils Relative %: 72 %
Platelets: 170 10*3/uL (ref 150–400)
RBC: 2.32 MIL/uL — ABNORMAL LOW (ref 3.87–5.11)
RDW: 14.6 % (ref 11.5–15.5)
WBC: 8 10*3/uL (ref 4.0–10.5)
nRBC: 0.9 % — ABNORMAL HIGH (ref 0.0–0.2)

## 2022-09-11 LAB — CK: Total CK: 94 U/L (ref 38–234)

## 2022-09-11 LAB — MAGNESIUM: Magnesium: 2 mg/dL (ref 1.7–2.4)

## 2022-09-11 LAB — OSMOLALITY: Osmolality: 283 mOsm/kg (ref 275–295)

## 2022-09-11 NOTE — Progress Notes (Signed)
Patient ID: [name], female   DOB: 1931-09-18, 89 y.o.   MRN: [id number] Subjective: 2 Days Post-Op Procedure(s) (LRB): INTRAMEDULLARY (IM) NAIL INTERTROCHANTERIC (Left)    Patient resting comfortably this morning. No events reported Limited PT due to needing significant assistance  Objective:   VITALS:   Vitals:   09/10/22 2129 09/11/22 0530  BP: 134/61 (!) 118/51  Pulse: (!) 56 (!) 58  Resp: 18 16  Temp: 98.6 F (37 C) 98.6 F (37 C)  SpO2: 97% 97%    Incision: dressing C/D/I  LABS Recent Labs    09/08/22 1740 09/09/22 0358 09/10/22 0346  HGB 11.4* 10.0* 9.7*  HCT 34.4* 31.2* 30.5*  WBC 11.0* 8.0 12.1*  PLT 247 215 179    Recent Labs    09/09/22 0358 09/10/22 1159 09/11/22 0352  NA 136 128* 129*  K 3.0* 3.2* 4.2  BUN 24* 29* 34*  CREATININE 1.60* 1.93* 2.05*  GLUCOSE 140* 235* 127*    Recent Labs    09/09/22 0358  INR 1.2     Assessment/Plan: 2 Days Post-Op Procedure(s) (LRB): INTRAMEDULLARY (IM) NAIL INTERTROCHANTERIC (Left)   Up with therapy Disposition pending PT assessment RTC in 2 weeks DVT prophylaxis, chronic Eliquis use

## 2022-09-11 NOTE — Care Management Important Message (Signed)
Important Message  Patient Details IM Letter given. Name: [name] MRN: [id number] Date of Birth: [birth year]   Medicare Important Message Given:  Yes     Caren Macadam 09/11/2022, 2:19 PM

## 2022-09-11 NOTE — Progress Notes (Signed)
Physical Therapy Treatment Patient Details Name: [name] MRN: [id number] DOB: 1931-08-08 Today's Date: 09/11/2022   History of Present Illness Pt s/p fall at home with L hip fx and now s/p IM nailing.  Pt with hx o fCAD, CKD, AICD and chronic a-fib.    PT Comments  POD # 2 General Comments: AxO x 3 pleasant Lady able to recall she fell "in the yard" and "had surgery Friday".  Pt is HOH so required repeat instructions. Assisted to EOB was difficult.  General bed mobility comments: required Max/Total Assist to complete transition to EOB esp assist to complete scooting.  Once upright, pt was able to static sit at [facility]. General transfer comment: cues for LE management and use of UEs to self assist. Step pvt bed to [facility] was VERY difficult.  Then assisted again to stand at bed side and switch BSC with recliner from behind. Positioned in recliner to comfort.   Pt will need ST Rehab at SNF to address mobility and functional decline prior to safely returning home.    If plan is discharge home, recommend the following: A lot of help with walking and/or transfers;A little help with bathing/dressing/bathroom;Assistance with cooking/housework;Assist for transportation;Help with stairs or ramp for entrance   Can travel by private vehicle     No  Equipment Recommendations  None recommended by PT    Recommendations for Other Services       Precautions / Restrictions Precautions Precautions: Fall Restrictions Weight Bearing Restrictions: No LLE Weight Bearing: Weight bearing as tolerated     Mobility  Bed Mobility Overal bed mobility: Needs Assistance       Supine to sit: Max assist, Total assist     General bed mobility comments: required Max/Total Assist to complete transition to EOB esp assist to complete scooting.  Once upright, pt was able to static sit at [employer].    Transfers Overall transfer level: Needs assistance Equipment used: Rolling walker (2  wheels) Transfers: Sit to/from Stand, Bed to chair/wheelchair/BSC Sit to Stand: Max assist, Total assist, +2 physical assistance, +2 safety/equipment, From elevated surface Stand pivot transfers: Max assist, Total assist, +2 physical assistance, +2 safety/equipment         General transfer comment: cues for LE management and use of UEs to self assist. Step pvt bed to [facility] was VERY difficult.  Then assisted again to stand at bed side and switch BSC with recliner from behind.    Ambulation/Gait               General Gait Details: transfers only   Stairs             Wheelchair Mobility     Tilt Bed    Modified Rankin (Stroke Patients Only)       Balance                                            Cognition Arousal: Alert Behavior During Therapy: WFL for tasks assessed/performed Overall Cognitive Status: Within Functional Limits for tasks assessed                                 General Comments: AxO x 3 pleasant Lady able to recall she fell "in the yard" and "had surgery Friday".  Pt is HOH so required  repeat instructions.        Exercises      General Comments        Pertinent Vitals/Pain Pain Assessment Pain Assessment: Faces Faces Pain Scale: Hurts even more Pain Location: L hip with activity Pain Descriptors / Indicators: Aching, Sore, Grimacing, Guarding Pain Intervention(s): Monitored during session, Premedicated before session, Repositioned, Ice applied    Home Living                          Prior Function            PT Goals (current goals can now be found in the care plan section) Progress towards PT goals: Progressing toward goals    Frequency    Min 1X/week      PT Plan      Co-evaluation              AM-PAC PT "6 Clicks" Mobility   Outcome Measure  Help needed turning from your back to your side while in a flat bed without using bedrails?: A Lot Help needed moving  from lying on your back to sitting on the side of a flat bed without using bedrails?: A Lot Help needed moving to and from a bed to a chair (including a wheelchair)?: A Lot Help needed standing up from a chair using your arms (e.g., wheelchair or bedside chair)?: A Lot Help needed to walk in hospital room?: Total Help needed climbing 3-5 steps with a railing? : Total 6 Click Score: 10    End of Session Equipment Utilized During Treatment: Gait belt Activity Tolerance: Patient limited by pain Patient left: with call bell/phone within reach;in chair;with chair alarm set Nurse Communication: Mobility status PT Visit Diagnosis: Difficulty in walking, not elsewhere classified (R26.2)     Time: 0981-1914 PT Time Calculation (min) (ACUTE ONLY): 36 min  Charges:    $Therapeutic Activity: 23-37 mins PT General Charges $$ ACUTE PT VISIT: 1 Visit                     Felecia Shelling  PTA Acute  Rehabilitation Services Office M-F          [phone]

## 2022-09-11 NOTE — Progress Notes (Signed)
[facility] Progress Note Patient: [name] BJY:[id number] DOB: [birth year] DOA: 09/08/2022  DOS: the patient was seen and examined on 09/11/2022  Brief hospital course: PMH of CAD SP PCI, chronic A-fib, frequent V. tach SP AICD placement, CKD 3A presented to hospital with complaints of mechanical fall.  Found to have left hip fracture. Was originally presented in [facility], was transferred to [facility] for orthopedic evaluation.  Assessment and Plan: Mechanical fall. Left hip fracture. Originally presented to [facility]. Transferred to [place]. Underwent left intertrochanteric intramedullary nail placement on 8/24. Monitor postop recovery. Patient is relatively high risk for poor surgical outcome although appears to have done well.  Postop blood loss anemia. Baseline hemoglobin around 11.  Postoperatively currently around 8. Patient has provided permission for blood transfusion. Eliquis on hold will resume tomorrow.  Right knee tenderness Left wrist erythema. X-ray of wrist as well as right knee negative for any acute fracture. Evidence of arthritis seen.  CKD 3A. Baseline serum creatinine appears to be ranging between 1.3-1.9. Based on that currently renal function stable. Monitor.  Chronic A-fib. On amiodarone, aspirin, Eliquis and Toprol-XL. Continue for now.  Reported history of prolonged QTc. QT on EKG is prolonged but corrected QT for wide QRS complex is 430. For now continue to use current medication including venlafaxine and amiodarone.  Hypothyroidism. Continue Synthroid for now.  Neuropathy. On gabapentin 300 mg. Continue at 100 mg nightly for now.  Mood disorder. Currently stable.   continue Effexor.  HLD. Continue Crestor.  GERD. Continuing Protonix twice daily AC.  Hypokalemia. Currently being treated aggressively.  Chronic anemia with dilutional drop. H&H dropped from 11-10 likely secondary to dilution. Baseline appears  to be around 10-11. At risk for postop anemia secondary to anticoagulation. Monitor.  Hypokalemia. Will correct.  Hyponatremia. Monitor for now.  Goals of care conversation. Patient had a DNR.  But reportedly told admitting provider that she wants to be a full code. Family at bedside corroborating this as well. Will continue to discuss goals   Subjective: No pain.  No nausea no vomiting.  Physical Exam: General: in Mild distress, No Rash Cardiovascular: S1 and S2 Present, No Murmur Respiratory: Good respiratory effort, Bilateral Air entry present. No Crackles, No wheezes Abdomen: Bowel Sound present, No tenderness Extremities: No edema, surgical site with expected swelling without any ongoing bleeding or hematoma. Neuro: Alert and oriented x3, no new focal deficit  Data Reviewed: I have Reviewed nursing notes, Vitals, and Lab results. Since last encounter, pertinent lab results CBC and BMP   . I have ordered test including CBC and BMP  .   Disposition: Status is: Inpatient Remains inpatient appropriate because: Needing for further workup and stability of H&H  SCDs Start: 09/09/22 1525 SCDs Start: 09/08/22 2205   Family Communication: Family at bedside Level of care: Telemetry   Vitals:   09/10/22 2129 09/11/22 0530 09/11/22 1018 09/11/22 1357  BP: 134/61 (!) 118/51 (!) 127/55 (!) 159/59  Pulse: (!) 56 (!) 58 (!) 56 (!) 57  Resp: 18 16  17   Temp: 98.6 F (37 C) 98.6 F (37 C) 97.7 F (36.5 C) 98.7 F (37.1 C)  TempSrc:  Oral Oral   SpO2: 97% 97% 97% 100%  Weight:      Height:         Author: Lynden Oxford, MD 09/11/2022 6:56 PM  Please look on [email or web address] to find out who is on call.

## 2022-09-11 NOTE — NC FL2 (Signed)
[place] MEDICAID FL2 LEVEL OF CARE FORM     IDENTIFICATION  Patient Name: [name] Birthdate: [birth year] Sex: female Admission Date (Current Location): 09/08/2022  [facility] and [place] Number:  Producer, television/film/video and Address:  [facility],  [address] [place], [place] [address]      Provider Number: [id number]  Attending Physician Name and Address:  Rolly Salter, MD  Relative Name and Phone Number:  Devra, Kopinski   [phone]    Current Level of Care:   Recommended Level of Care: Skilled Nursing Facility Prior Approval Number:    Date Approved/Denied:   PASRR Number: [id number]  Discharge Plan: SNF    Current Diagnoses: Patient Active Problem List   Diagnosis Date Noted   Closed left hip fracture (HCC) 09/08/2022   Stroke-like symptoms 12/12/2021   Contusion of rib, right, initial encounter 12/09/2021   Fall at home, initial encounter 12/09/2021   Nuclear sclerotic cataract of left eye 08/09/2021   Hypertensive kidney disease with stage 3b chronic kidney disease (HCC) 04/04/2021   Weight loss 03/21/2021   Palliative care encounter 03/21/2021   TIA (transient ischemic attack) 02/03/2021   Cellulitis and abscess of left lower extremity 06/21/2020   Cellulitis of left leg 06/20/2020   Loss of weight 05/18/2020   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   Intractable abdominal pain 03/28/2020   Lactic acidosis 03/28/2020   Elevated troponin I level 03/28/2020   Malnutrition of moderate degree 12/26/2019   Prolonged QT interval 12/23/2019   Acute lower UTI 12/23/2019   Constipation 11/27/2019   Diarrhea 11/27/2019   Pancreatic lesion 11/26/2019   Fibromuscular dysplasia of right renal artery (HCC) 11/26/2019   Left renal artery stenosis (HCC) 11/26/2019   RUQ pain    Abnormal CT of the abdomen    Vertigo 11/15/2019   Advanced care planning/counseling discussion 11/15/2019   CAD (coronary artery disease)    Hypothyroidism     Hyponatremia    Volume depletion    TSH elevation 08/01/2019   Hospital discharge follow-up 08/01/2019   Exudative age-related macular degeneration of right eye with active choroidal neovascularization (HCC) 04/30/2019   Retinal hemorrhage of right eye 04/30/2019   Advanced nonexudative age-related macular degeneration of left eye with subfoveal involvement 04/30/2019   Exudative age-related macular degeneration of left eye with inactive choroidal neovascularization (HCC) 04/30/2019   Advanced nonexudative age-related macular degeneration of right eye with subfoveal involvement 04/30/2019   Posterior vitreous detachment of both eyes 04/30/2019   Vitreous degeneration, bilateral 04/30/2019   Secondary hypercoagulable state (HCC) 03/11/2019   Persistent atrial fibrillation (HCC)    Proctitis 01/09/2019   Atrial fibrillation, chronic (HCC) 01/09/2019   Hyperlipidemia 01/09/2019   Nausea 01/09/2019   Hyperglycemia 01/09/2019   COVID-19 virus infection 12/28/2018   Chronic diarrhea 09/18/2018   Abdominal pain 09/18/2018   Osteoarthritis of right knee 03/29/2018   GERD (gastroesophageal reflux disease)    Dehydration 02/06/2018   Hypokalemia 02/06/2018   Diarrhea of presumed infectious origin 02/06/2018   Acute kidney injury superimposed on CKD (HCC) 02/05/2018   Essential hypertension 12/21/2017   Degenerative scoliosis 02/19/2017   Burst fracture of lumbar vertebra (HCC) 02/19/2017   History of drug-induced prolonged QT interval with torsade de pointes 11/01/2016   Iron deficiency anemia 10/30/2016   Long term current use of anticoagulant 02/17/2016   Near syncope 05/22/2015   Diabetes mellitus type 2, diet-controlled (HCC) 09/14/2014   ICD (implantable cardioverter-defibrillator) battery depletion 01/20/2014   Abnormal nuclear  stress test, 12/30/13 12/31/2013   Coronary artery disease involving native coronary artery of native heart without angina pectoris 12/31/2013   Paroxysmal  atrial fibrillation, chads2 Vasc2 score of 5, on eliquis 10/16/2013   Catecholaminergic polymorphic ventricular tachycardia (HCC) 10/16/2013   Ureteral stone with hydronephrosis 05/20/2013   Metabolic syndrome 05/07/2013   Orthostatic hypotension 07/27/2011   Chest pain 07/25/2011   Weakness 07/25/2011   Chronic sinus bradycardia 07/25/2011   ICD (implantable cardioverter-defibrillator) in place 07/25/2011   CKD (chronic kidney disease), stage III (HCC) 07/25/2011   History of DVT in the past, on eliquis now 07/25/2011   Nephrolithiasis, just saw Dr Annabell Howells- "OK" 07/25/2011   DYSPHAGIA 03/24/2010   Chronic ischemic heart disease 12/03/2007   Irritable bowel syndrome with diarrhea 12/03/2007   EPIGASTRIC PAIN 12/03/2007   Hyperlipidemia associated with type 2 diabetes mellitus (HCC) 12/02/2007   EXTERNAL HEMORRHOIDS 12/02/2007   ESOPHAGITIS 12/02/2007   GASTRITIS 12/02/2007   DIVERTICULOSIS, COLON 12/02/2007   ARTHRITIS 12/02/2007    Orientation RESPIRATION BLADDER Height & Weight     Self, Time, Situation, Place  Normal Continent Weight: 117 lb 15.1 oz (53.5 kg) Height:  5\' 3"  (160 cm)  BEHAVIORAL SYMPTOMS/MOOD NEUROLOGICAL BOWEL NUTRITION STATUS      Continent Diet  AMBULATORY STATUS COMMUNICATION OF NEEDS Skin   Limited Assist Verbally Surgical wounds                       Personal Care Assistance Level of Assistance  Bathing, Feeding, Dressing Bathing Assistance: Limited assistance Feeding assistance: Independent Dressing Assistance: Limited assistance     Functional Limitations Info  Hearing, Speech, Sight Sight Info: Adequate Hearing Info: Adequate Speech Info: Adequate    SPECIAL CARE FACTORS FREQUENCY  PT (By licensed PT), OT (By licensed OT)     PT Frequency: Minimum 5x a week OT Frequency: Minimum 5x a week            Contractures Contractures Info: Not present    Additional Factors Info  Code Status, Allergies, Psychotropic Code Status Info:  Full Code Allergies Info: Sotalol  Tramadol  Actos (Pioglitazone)  Adhesive (Tape)  Atorvastatin  Bactrim (Sulfamethoxazole-trimethoprim)  Ciprofloxacin  Contrast Media (Iodinated Contrast Media)  Latex  Pedi-pre Tape Spray (Wound Dressing Adhesive)  Sulfa Antibiotics Psychotropic Info: venlafaxine XR (EFFEXOR-XR) 24 hr capsule 37.5 mg         Current Medications (09/11/2022):  This is the current hospital active medication list Current Facility-Administered Medications  Medication Dose Route Frequency Provider Last Rate Last Admin   acetaminophen (TYLENOL) tablet 650 mg  650 mg Oral Q6H Cassandria Anger, PA-C   650 mg at 09/11/22 1803   amiodarone (PACERONE) tablet 200 mg  200 mg Oral Daily Levie Heritage, DO   200 mg at 09/11/22 1033   bisacodyl (DULCOLAX) suppository 10 mg  10 mg Rectal Daily PRN Cassandria Anger, PA-C       docusate sodium (COLACE) capsule 100 mg  100 mg Oral BID Cassandria Anger, PA-C   100 mg at 09/11/22 1036   feeding supplement (ENSURE ENLIVE / ENSURE PLUS) liquid 237 mL  237 mL Oral BID BM Levie Heritage, DO       gabapentin (NEURONTIN) capsule 100 mg  100 mg Oral QHS Levie Heritage, DO   100 mg at 09/10/22 2128   HYDROmorphone (DILAUDID) injection 0.5-1 mg  0.5-1 mg Intravenous Q4H PRN Cassandria Anger, PA-C  isosorbide mononitrate (IMDUR) 24 hr tablet 60 mg  60 mg Oral Daily Rolly Salter, MD   60 mg at 09/10/22 0959   latanoprost (XALATAN) 0.005 % ophthalmic solution 1 drop  1 drop Left Eye QHS Levie Heritage, DO   1 drop at 09/10/22 2138   levothyroxine (SYNTHROID) tablet 112 mcg  112 mcg Oral Q0600 Levie Heritage, DO   112 mcg at 09/11/22 2440   menthol-cetylpyridinium (CEPACOL) lozenge 3 mg  1 lozenge Oral PRN Cassandria Anger, PA-C       Or   phenol (CHLORASEPTIC) mouth spray 1 spray  1 spray Mouth/Throat PRN Cassandria Anger, PA-C       methocarbamol (ROBAXIN) tablet 500 mg  500 mg Oral Q6H PRN Cassandria Anger, PA-C       Or    methocarbamol (ROBAXIN) 500 mg in dextrose 5 % 50 mL IVPB  500 mg Intravenous Q6H PRN Cassandria Anger, PA-C 110 mL/hr at 09/09/22 1832 Infusion Verify at 09/09/22 1832   metoprolol succinate (TOPROL-XL) 24 hr tablet 12.5 mg  12.5 mg Oral BID Rolly Salter, MD   12.5 mg at 09/11/22 1034   ondansetron (ZOFRAN) tablet 4 mg  4 mg Oral Q6H PRN Levie Heritage, DO       Or   ondansetron [facility]) injection 4 mg  4 mg Intravenous Q6H PRN Levie Heritage, DO       oxyCODONE (Oxy IR/ROXICODONE) immediate release tablet 2.5-5 mg  2.5-5 mg Oral Q4H PRN Rosalene Billings R, PA-C   2.5 mg at 09/11/22 1029   oxyCODONE (Oxy IR/ROXICODONE) immediate release tablet 5-10 mg  5-10 mg Oral Q4H PRN Cassandria Anger, PA-C       pantoprazole (PROTONIX) EC tablet 40 mg  40 mg Oral BID AC Levie Heritage, DO   40 mg at 09/11/22 1803   polyethylene glycol (MIRALAX / GLYCOLAX) packet 17 g  17 g Oral Daily PRN Cassandria Anger, PA-C       polyvinyl alcohol (LIQUIFILM TEARS) 1.4 % ophthalmic solution 1 drop  1 drop Both Eyes TID PRN Levie Heritage, DO       rosuvastatin (CRESTOR) tablet 10 mg  10 mg Oral Daily Levie Heritage, DO   10 mg at 09/11/22 1037   senna-docusate (Senokot-S) tablet 1 tablet  1 tablet Oral BID Rolly Salter, MD   1 tablet at 09/11/22 1037   venlafaxine XR (EFFEXOR-XR) 24 hr capsule 37.5 mg  37.5 mg Oral Q breakfast Rolly Salter, MD   37.5 mg at 09/11/22 1036     Discharge Medications: Please see discharge summary for a list of discharge medications.  Relevant Imaging Results:  Relevant Lab Results:   Additional Information SSN [id number]  Darleene Cleaver, LCSW

## 2022-09-11 NOTE — TOC Initial Note (Signed)
Transition of Care [facility]) - Initial/Assessment Note    Patient Details  Name: [name] MRN: [id number] Date of Birth: 25-Aug-1931  [facility] CM/SW Contact:    Renee Cleaver, LCSW Phone Number: 09/11/2022, 6:25 PM  Clinical Narrative:                  Patient is an 89 year old female who is alert and oriented x4.  Patient lives with he daughter and participates in [facility] program.  CSW contacted [facility] and said they have spoken to the daughter already and informed her of which facilities are contracted with them.  The facilities are [place], [address], [place], and [place].  CSW spoke to the daughter [name], and she said they would prefer [facility] if possible, but understand, that it will depend on which facilities have beds available.  CSW has sent patient's information to different contracted facilities waiting on bed offers.  CSW to continue to follow patient's progress throughout discharge planning.  Expected Discharge Plan: Skilled Nursing Facility Barriers to Discharge: Insurance Authorization   Patient Goals and CMS Choice Patient states their goals for this hospitalization and ongoing recovery are:: To go to SNF for short term rehab, the return back home. CMS Medicare.gov Compare Post Acute Care list provided to:: Patient Represenative (must comment) (Daughter [name]) Choice offered to / list presented to : Adult Children [place] ownership interest in [facility].provided to:: Adult Children    Expected Discharge Plan and Services     Post Acute Care Choice: Skilled Nursing Facility Living arrangements for the past 2 months: Single Family Home                                      Prior Living Arrangements/Services Living arrangements for the past 2 months: Single Family Home Lives with:: Adult Children Patient language and need for interpreter reviewed:: Yes Do you feel safe going back to the place where you live?: No    Patient nees short term rehab prior to returning back home.  Need for Family Participation in Patient Care: Yes (Comment) Care giver support system in place?: Yes (comment)   Criminal Activity/Legal Involvement Pertinent to Current Situation/Hospitalization: No - Comment as needed  Activities of Daily Living Home Assistive Devices/Equipment: Other (Comment), Eyeglasses, Dentures (specify type), Hearing aid (rollator, has top and bottom dentures in currently, hearing aids are not here with her) ADL Screening (condition at time of admission) Patient's cognitive ability adequate to safely complete daily activities?: Yes Is the patient deaf or have difficulty hearing?: Yes Does the patient have difficulty seeing, even when wearing glasses/contacts?: No Does the patient have difficulty concentrating, remembering, or making decisions?: No Patient able to express need for assistance with ADLs?: Yes Does the patient have difficulty dressing or bathing?: Yes Independently performs ADLs?: No Communication: Independent Dressing (OT): Needs assistance Is this a change from baseline?: Pre-admission baseline Grooming: Needs assistance Is this a change from baseline?: Pre-admission baseline Feeding: Independent Bathing: Needs assistance (has an aide that comes to her house daily) Is this a change from baseline?: Pre-admission baseline Toileting: Needs assistance Is this a change from baseline?: Pre-admission baseline In/Out Bed: Independent with device (comment) (uses a rollator) Walks in Home: Independent with device (comment) (uses rollator) Does the patient have difficulty walking or climbing stairs?: Yes Weakness of Legs: Both Weakness of Arms/Hands: None  Permission Sought/Granted Permission  sought to share information with : Case Manager, Magazine features editor, Family Supports Permission granted to share information with : Yes, Verbal Permission Granted, Yes, Release of Information  Signed  Share Information with NAME: [name], [name] and [name] Bast   [phone]  Permission granted to share info w AGENCY: SNF admissions        Emotional Assessment Appearance:: Appears stated age   Affect (typically observed): Accepting, Appropriate, Calm Orientation: : Oriented to Self, Oriented to Place, Oriented to  Time, Oriented to Situation Alcohol / Substance Use: Not Applicable Psych Involvement: No (comment)  Admission diagnosis:  Closed left hip fracture (HCC) [S72.002A] Intertrochanteric fracture of left femur, closed, initial encounter (HCC) [S72.142A] Patient Active Problem List   Diagnosis Date Noted   Closed left hip fracture (HCC) 09/08/2022   Stroke-like symptoms 12/12/2021   Contusion of rib, right, initial encounter 12/09/2021   Fall at home, initial encounter 12/09/2021   Nuclear sclerotic cataract of left eye 08/09/2021   Hypertensive [name] disease with stage 3b chronic [name] disease (HCC) 04/04/2021   Weight loss 03/21/2021   Palliative care encounter 03/21/2021   TIA (transient ischemic attack) 02/03/2021   Cellulitis and abscess of left lower extremity 06/21/2020   Cellulitis of left leg 06/20/2020   Loss of weight 05/18/2020   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   Intractable abdominal pain 03/28/2020   Lactic acidosis 03/28/2020   Elevated troponin I level 03/28/2020   Malnutrition of moderate degree 12/26/2019   Prolonged QT interval 12/23/2019   Acute lower UTI 12/23/2019   Constipation 11/27/2019   Diarrhea 11/27/2019   Pancreatic lesion 11/26/2019   Fibromuscular dysplasia of right renal artery (HCC) 11/26/2019   Left renal artery stenosis (HCC) 11/26/2019   RUQ pain    Abnormal CT of the abdomen    Vertigo 11/15/2019   Advanced care planning/counseling discussion 11/15/2019   CAD (coronary artery disease)    Hypothyroidism    Hyponatremia    Volume depletion    TSH elevation 08/01/2019   Hospital discharge follow-up 08/01/2019    Exudative age-related macular degeneration of right eye with active choroidal neovascularization (HCC) 04/30/2019   Retinal hemorrhage of right eye 04/30/2019   Advanced nonexudative age-related macular degeneration of left eye with subfoveal involvement 04/30/2019   Exudative age-related macular degeneration of left eye with inactive choroidal neovascularization (HCC) 04/30/2019   Advanced nonexudative age-related macular degeneration of right eye with subfoveal involvement 04/30/2019   Posterior vitreous detachment of both eyes 04/30/2019   Vitreous degeneration, bilateral 04/30/2019   Secondary hypercoagulable state (HCC) 03/11/2019   Persistent atrial fibrillation (HCC)    Proctitis 01/09/2019   Atrial fibrillation, chronic (HCC) 01/09/2019   Hyperlipidemia 01/09/2019   Nausea 01/09/2019   Hyperglycemia 01/09/2019   COVID-19 virus infection 12/28/2018   Chronic diarrhea 09/18/2018   Abdominal pain 09/18/2018   Osteoarthritis of right knee 03/29/2018   GERD (gastroesophageal reflux disease)    Dehydration 02/06/2018   Hypokalemia 02/06/2018   Diarrhea of presumed infectious origin 02/06/2018   Acute [name] injury superimposed on CKD (HCC) 02/05/2018   Essential hypertension 12/21/2017   Degenerative scoliosis 02/19/2017   Burst fracture of lumbar vertebra (HCC) 02/19/2017   History of drug-induced prolonged QT interval with torsade de pointes 11/01/2016   Iron deficiency anemia 10/30/2016   Long term current use of anticoagulant 02/17/2016   Near syncope 05/22/2015   Diabetes mellitus type 2, diet-controlled (HCC) 09/14/2014   ICD (implantable cardioverter-defibrillator) battery depletion 01/20/2014   Abnormal nuclear stress test, 12/30/13 12/31/2013  Coronary artery disease involving native coronary artery of native heart without angina pectoris 12/31/2013   Paroxysmal atrial fibrillation, chads2 Vasc2 score of 5, on eliquis 10/16/2013   Catecholaminergic polymorphic  ventricular tachycardia (HCC) 10/16/2013   Ureteral stone with hydronephrosis 05/20/2013   Metabolic syndrome 05/07/2013   Orthostatic hypotension 07/27/2011   Chest pain 07/25/2011   Weakness 07/25/2011    Class: Acute   Chronic sinus bradycardia 07/25/2011   ICD (implantable cardioverter-defibrillator) in place 07/25/2011   CKD (chronic [name] disease), stage III (HCC) 07/25/2011    Class: Chronic   History of DVT in the past, on eliquis now 07/25/2011    Class: History of   Nephrolithiasis, just saw Dr Annabell Howells- "OK" 07/25/2011    Class: History of   DYSPHAGIA 03/24/2010   Chronic ischemic heart disease 12/03/2007   Irritable bowel syndrome with diarrhea 12/03/2007   EPIGASTRIC PAIN 12/03/2007   Hyperlipidemia associated with type 2 diabetes mellitus (HCC) 12/02/2007    Class: History of   EXTERNAL HEMORRHOIDS 12/02/2007    Class: History of   ESOPHAGITIS 12/02/2007   GASTRITIS 12/02/2007   DIVERTICULOSIS, COLON 12/02/2007   ARTHRITIS 12/02/2007    Class: Chronic   PCP:  Bayard Males, NP Pharmacy:   [facility] [place], [place] - [address] [place] [address] Phone: [phone] Fax: [phone]     Social Determinants of Health (SDOH) Social History: SDOH Screenings   Food Insecurity: No Food Insecurity (09/08/2022)  Housing: Low Risk  (09/08/2022)  Transportation Needs: No Transportation Needs (09/08/2022)  Utilities: Not At Risk (09/08/2022)  Alcohol Screen: Low Risk  (09/27/2020)  Depression (PHQ2-9): Low Risk  (07/11/2021)  Financial Resource Strain: Low Risk  (09/27/2020)  Physical Activity: Inactive (03/15/2021)  Social Connections: Moderately Isolated (09/27/2020)  Stress: Stress Concern Present (03/15/2021)  Tobacco Use: Low Risk  (09/08/2022)   SDOH Interventions:     Readmission Risk Interventions    06/23/2020   12:44 PM 03/28/2020    2:58 PM  Readmission Risk Prevention Plan  Transportation  Screening  Complete  Medication Review (RN Care Manager) Complete Complete  PCP or Specialist appointment within 3-5 days of discharge Complete   HRI or Home Care Consult Complete Complete  SW Recovery Care/Counseling Consult Complete Complete  Palliative Care Screening Not Applicable Not Applicable  Skilled Nursing Facility Patient Refused Not Applicable

## 2022-09-12 ENCOUNTER — Encounter (INDEPENDENT_AMBULATORY_CARE_PROVIDER_SITE_OTHER): Payer: Medicare (Managed Care) | Admitting: Ophthalmology

## 2022-09-12 DIAGNOSIS — H35033 Hypertensive retinopathy, bilateral: Secondary | ICD-10-CM

## 2022-09-12 DIAGNOSIS — I10 Essential (primary) hypertension: Secondary | ICD-10-CM

## 2022-09-12 DIAGNOSIS — S72002A Fracture of unspecified part of neck of left femur, initial encounter for closed fracture: Secondary | ICD-10-CM | POA: Diagnosis not present

## 2022-09-12 DIAGNOSIS — Z961 Presence of intraocular lens: Secondary | ICD-10-CM

## 2022-09-12 DIAGNOSIS — H353211 Exudative age-related macular degeneration, right eye, with active choroidal neovascularization: Secondary | ICD-10-CM

## 2022-09-12 DIAGNOSIS — H353124 Nonexudative age-related macular degeneration, left eye, advanced atrophic with subfoveal involvement: Secondary | ICD-10-CM

## 2022-09-12 LAB — CBC
HCT: 26.3 % — ABNORMAL LOW (ref 36.0–46.0)
Hemoglobin: 8.7 g/dL — ABNORMAL LOW (ref 12.0–15.0)
MCH: 31.5 pg (ref 26.0–34.0)
MCHC: 33.1 g/dL (ref 30.0–36.0)
MCV: 95.3 fL (ref 80.0–100.0)
Platelets: 222 10*3/uL (ref 150–400)
RBC: 2.76 MIL/uL — ABNORMAL LOW (ref 3.87–5.11)
RDW: 14.5 % (ref 11.5–15.5)
WBC: 8.6 10*3/uL (ref 4.0–10.5)
nRBC: 0.5 % — ABNORMAL HIGH (ref 0.0–0.2)

## 2022-09-12 LAB — MAGNESIUM: Magnesium: 2.2 mg/dL (ref 1.7–2.4)

## 2022-09-12 LAB — BASIC METABOLIC PANEL
Anion gap: 5 (ref 5–15)
BUN: 28 mg/dL — ABNORMAL HIGH (ref 8–23)
CO2: 24 mmol/L (ref 22–32)
Calcium: 8.2 mg/dL — ABNORMAL LOW (ref 8.9–10.3)
Chloride: 102 mmol/L (ref 98–111)
Creatinine, Ser: 1.43 mg/dL — ABNORMAL HIGH (ref 0.44–1.00)
GFR, Estimated: 35 mL/min — ABNORMAL LOW (ref >60)
Glucose, Bld: 152 mg/dL — ABNORMAL HIGH (ref 70–99)
Potassium: 3.1 mmol/L — ABNORMAL LOW (ref 3.5–5.1)
Sodium: 131 mmol/L — ABNORMAL LOW (ref 135–145)

## 2022-09-12 MED ORDER — ENSURE ENLIVE PO LIQD: 237.0000 mL | Freq: Two times a day (BID) | ORAL | 12 refills | Status: DC

## 2022-09-12 MED ORDER — APIXABAN 2.5 MG PO TABS
2.5000 mg | ORAL_TABLET | Freq: Two times a day (BID) | ORAL | Status: DC
  Administered 2022-09-12: 2.5 mg via ORAL
  Filled 2022-09-12: qty 1

## 2022-09-12 MED ORDER — POTASSIUM CHLORIDE CRYS ER 20 MEQ PO TBCR
40.0000 meq | EXTENDED_RELEASE_TABLET | Freq: Once | ORAL | Status: AC
  Administered 2022-09-12: 40 meq via ORAL
  Filled 2022-09-12: qty 2

## 2022-09-12 MED ORDER — OXYCODONE HCL 5 MG PO TABS: 5.0000 mg | ORAL_TABLET | Freq: Four times a day (QID) | ORAL | 0 refills | Status: DC | PRN

## 2022-09-12 MED ORDER — GABAPENTIN 100 MG PO CAPS: 100.0000 mg | ORAL_CAPSULE | Freq: Every day | ORAL | 0 refills | Status: DC

## 2022-09-12 MED ORDER — POLYETHYLENE GLYCOL 3350 17 G PO PACK: 17.0000 g | PACK | Freq: Every day | ORAL | 0 refills | Status: DC | PRN

## 2022-09-12 MED ORDER — METHOCARBAMOL 500 MG PO TABS: 500.0000 mg | ORAL_TABLET | Freq: Three times a day (TID) | ORAL | 0 refills | Status: DC | PRN

## 2022-09-12 MED ORDER — DOCUSATE SODIUM 100 MG PO CAPS: 100.0000 mg | ORAL_CAPSULE | Freq: Two times a day (BID) | ORAL | 0 refills | Status: DC

## 2022-09-12 NOTE — Anesthesia Postprocedure Evaluation (Signed)
Anesthesia Post Note  Patient: [name]  Procedure(s) Performed: INTRAMEDULLARY (IM) NAIL INTERTROCHANTERIC (Left: Hip)     Patient location during evaluation: PACU Anesthesia Type: General Level of consciousness: sedated and patient cooperative Pain management: pain level controlled Vital Signs Assessment: post-procedure vital signs reviewed and stable Respiratory status: spontaneous breathing Cardiovascular status: stable Anesthetic complications: no   No notable events documented.  Last Vitals:  Vitals:   09/11/22 2137 09/12/22 0452  BP: (!) 171/71 (!) 173/63  Pulse: 60 (!) 56  Resp: 14 13  Temp: 36.9 C 36.8 C  SpO2: 100% 98%    Last Pain:  Vitals:   09/12/22 0452  TempSrc: Oral  PainSc:                  Lewie Loron

## 2022-09-12 NOTE — Discharge Summary (Signed)
Physician Discharge Summary   Patient: [name] MRN: [id number] DOB: 05/09/1931  Admit date:     09/08/2022  Discharge date: 09/12/22  Discharge Physician: Lynden Oxford  PCP: Bayard Males, NP  Recommendations at discharge:  Follow up with Orthopedics and pcp as recommended   Follow-up Information     Durene Romans, MD. Schedule an appointment as soon as possible for a visit in 2 week(s).   Specialty: Orthopedic Surgery Contact information: [address] [place] [address] [place] [address] [phone]         Bayard Males, NP. Schedule an appointment as soon as possible for a visit in 1 week(s).   Why: with BMP lab to look at kidney and electrolytes, with CBC lab to look at blood counts Contact information: [address] [place] [address] [phone]                Discharge Diagnoses: Principal Problem:   Closed left hip fracture (HCC) Active Problems:   ICD (implantable cardioverter-defibrillator) in place   CKD (chronic kidney disease), stage III (HCC)   Catecholaminergic polymorphic ventricular tachycardia (HCC)   Diabetes mellitus type 2, diet-controlled (HCC)   Atrial fibrillation, chronic (HCC)   CAD (coronary artery disease)  Brief hospital course: PMH of CAD SP PCI, chronic A-fib, frequent V. tach SP AICD placement, CKD 3A presented to hospital with complaints of mechanical fall.  Found to have left hip fracture. Was originally presented in [facility], was transferred to [facility] for orthopedic evaluation.  Assessment and Plan: Mechanical fall. Left hip fracture. Originally presented to [facility]. Transferred to [place]. Underwent left intertrochanteric intramedullary nail placement on 8/24. Monitor postop recovery. Patient is relatively high risk for poor surgical outcome although appears to have done well.  Postop blood loss anemia. Baseline hemoglobin around 11.  Postoperatively currently around  8. Patient has provided permission for blood transfusion. Eliquis was held for one dose and resumed since Hb remained stable.   Right knee tenderness Left wrist erythema. X-ray of wrist as well as right knee negative for any acute fracture. Evidence of arthritis seen.  CKD 3A. Baseline serum creatinine appears to be ranging between 1.3-1.9. Based on that currently renal function stable. Monitor.  Chronic A-fib. On amiodarone, aspirin, Eliquis and Toprol-XL. Continue for now.  Reported history of prolonged QTc. QT on EKG is prolonged but corrected QT for wide QRS complex is 430. For now continue to use current medication including venlafaxine and amiodarone.  Hypothyroidism. Continue Synthroid for now.  Neuropathy. On gabapentin 300 mg. Continue at 100 mg nightly for now.  Mood disorder. Currently stable.   continue Effexor.  HLD. Continue Crestor.  GERD. Continuing Protonix twice daily AC.  Hypokalemia. Currently being treated aggressively.  Chronic anemia with dilutional drop. H&H dropped from 11-10 likely secondary to dilution. Baseline appears to be around 10-11. At risk for postop anemia secondary to anticoagulation. Monitor.  Hypokalemia. Will correct.  Hyponatremia. Monitor for now.  Goals of care conversation. Patient had a DNR.  But reportedly told admitting provider that she wants to be a full code. Family at bedside corroborating this as well. Will continue to discuss goals  Pain control - [facility] Controlled Substance Reporting System database was reviewed. and patient was instructed, not to drive, operate heavy machinery, perform activities at heights, swimming or participation in water activities or provide baby-sitting services while on Pain, Sleep and Anxiety Medications; until their outpatient Physician has advised to do so  again. Also recommended to not to take more than prescribed Pain, Sleep and Anxiety Medications.  Consultants:   Orthopedics   Procedures performed:  INTRAMEDULLARY (IM) NAIL INTERTROCHANTERIC (Left)   DISCHARGE MEDICATION: Allergies as of 09/12/2022       Reactions   Sotalol Other (See Comments)   Torsades   Tramadol Other (See Comments)   Hallucination, bad-dream   Actos [pioglitazone] Swelling   Adhesive [tape] Other (See Comments)   Causes sores   Atorvastatin Other (See Comments)   myalgia   Bactrim [sulfamethoxazole-trimethoprim] Itching, Rash   Itchy rash   Ciprofloxacin Nausea Only   Contrast Media [iodinated Contrast Media] Other (See Comments)   Headache (no action/pre-med required)   Latex Rash   Pedi-pre Tape Spray [wound Dressing Adhesive] Other (See Comments)   Causes sores   Sulfa Antibiotics Other (See Comments)   Causes sores        Medication List     STOP taking these medications    amLODipine 2.5 MG tablet Commonly known as: NORVASC       TAKE these medications    acetaminophen 500 MG tablet Commonly known as: TYLENOL Take 1 tablet (500 mg total) by mouth every 6 (six) hours as needed for mild pain, fever or headache.   Alpha-Lipoic Acid 600 MG Tabs Take 600 mg by mouth daily. For neuropathy in legs What changed:  how much to take additional instructions   amiodarone 200 MG tablet Commonly known as: PACERONE Take 1 tablet (200 mg total) by mouth daily.   aspirin EC 81 MG tablet Take 1 tablet (81 mg total) by mouth daily. Swallow whole.   carboxymethylcellulose 0.5 % Soln Commonly known as: REFRESH PLUS Place 1 drop into both eyes 3 (three) times daily as needed (dry/irritated eyes.).   CENTRUM SILVER ULTRA WOMENS PO Take 1 tablet by mouth every morning.   docusate sodium 100 MG capsule Commonly known as: COLACE Take 1 capsule (100 mg total) by mouth 2 (two) times daily.   Eliquis 2.5 MG Tabs tablet Generic drug: apixaban TAKE (1) TABLET TWICE A DAY. What changed: See the new instructions.   feeding supplement Liqd Take 237 mLs  by mouth 2 (two) times daily between meals.   gabapentin 100 MG capsule Commonly known as: NEURONTIN Take 1 capsule (100 mg total) by mouth at bedtime. What changed:  medication strength how much to take when to take this Another medication with the same name was removed. Continue taking this medication, and follow the directions you see here.   isosorbide mononitrate 60 MG 24 hr tablet Commonly known as: IMDUR Take 1 tablet (60 mg total) by mouth daily.   latanoprost 0.005 % ophthalmic solution Commonly known as: XALATAN Place 1 drop into the left eye at bedtime.   levothyroxine 112 MCG tablet Commonly known as: SYNTHROID Take 112 mcg by mouth daily. What changed: Another medication with the same name was removed. Continue taking this medication, and follow the directions you see here.   MELATONIN PO Take 10 mg by mouth at bedtime.   methocarbamol 500 MG tablet Commonly known as: ROBAXIN Take 1 tablet (500 mg total) by mouth every 8 (eight) hours as needed for muscle spasms.   metoprolol succinate 25 MG 24 hr tablet Commonly known as: TOPROL-XL Take 0.5 tablets (12.5 mg total) by mouth daily. Take with or immediately following a meal. What changed: when to take this   nitroGLYCERIN 0.4 MG SL tablet Commonly known as: NITROSTAT PLACE 1 TABLET  UNDER THE TONGUE AT ONSET OF CHEST PAIN EVERY 5 MINTUES UP TO 3 TIMES AS NEEDED What changed: See the new instructions.   oxyCODONE 5 MG immediate release tablet Commonly known as: Oxy IR/ROXICODONE Take 1 tablet (5 mg total) by mouth every 6 (six) hours as needed for severe pain or moderate pain.   pantoprazole 40 MG tablet Commonly known as: PROTONIX Take 1 tablet (40 mg total) by mouth 2 (two) times daily before a meal.   polyethylene glycol 17 g packet Commonly known as: MIRALAX / GLYCOLAX Take 17 g by mouth daily as needed for mild constipation.   rosuvastatin 10 MG tablet Commonly known as: CRESTOR TAKE 1 TABLET  DAILY   simethicone 125 MG chewable tablet Commonly known as: MYLICON Chew 125 mg by mouth every 6 (six) hours as needed for flatulence.   venlafaxine 37.5 MG tablet Commonly known as: EFFEXOR Take 37.5 mg by mouth daily. Once a day at noon.       Disposition: SNF Diet recommendation: Cardiac diet  Discharge Exam: Vitals:   09/11/22 1357 09/11/22 2137 09/12/22 0452 09/12/22 0847  BP: (!) 159/59 (!) 171/71 (!) 173/63 (!) 179/71  Pulse: (!) 57 60 (!) 56 (!) 58  Resp: 17 14 13 16   Temp: 98.7 F (37.1 C) 98.4 F (36.9 C) 98.3 F (36.8 C) (!) 97.5 F (36.4 C)  TempSrc:  Oral Oral Oral  SpO2: 100% 100% 98% 100%  Weight:      Height:       General: Appear in no distress; no visible Abnormal Neck Mass Or lumps, Conjunctiva normal Cardiovascular: S1 and S2 Present, aortic systolic  Murmur, Respiratory: good respiratory effort, Bilateral Air entry present and CTA, no Crackles, no wheezes Abdomen: Bowel Sound present, Non tender  Extremities: no Pedal edema Neurology: alert and oriented to time, place, and person  [facility]   09/08/22 2146  Weight: 53.5 kg   Condition at discharge: stable  The results of significant diagnostics from this hospitalization (including imaging, microbiology, ancillary and laboratory) are listed below for reference.   Imaging Studies: DG Wrist Complete Left  Result Date: 09/09/2022 CLINICAL DATA:  Fall and trauma to the left wrist. EXAM: LEFT WRIST - COMPLETE 3+ VIEW COMPARISON:  None Available. FINDINGS: There is no acute fracture or dislocation. The bones are osteopenic. There is arthritic changes of the base of the thumb. Mild soft tissue swelling of the wrist. No radiopaque foreign object or soft tissue gas. IMPRESSION: 1. No acute fracture or dislocation. 2. Osteopenia and arthritic changes. Electronically Signed   By: Elgie Collard M.D.   On: 09/09/2022 19:22   DG Knee Complete 4 Views Right  Result Date: 09/09/2022 CLINICAL DATA:   Knee pain.  Fall yesterday. EXAM: RIGHT KNEE - COMPLETE 4 VIEW COMPARISON:  None Available. FINDINGS: Osteophytes seen of all 3 compartments. Greatest of the medial compartment. There is some joint space loss as well of the medial compartment with irregularity along the cortex of the medial femoral condyle at the joint space. Please correlate for sequela of previous osteochondral abnormality. No joint effusion on lateral view. Severe osteopenia. No acute fracture or dislocation. Presumed vascular calcifications. Chondrocalcinosis. IMPRESSION: Osteopenia with tricompartmental degenerative changes. Possible sequela of remote osteochondral abnormality along the medial femoral condyle versus relation to degenerative change. Electronically Signed   By: Karen Kays M.D.   On: 09/09/2022 18:52   ECHOCARDIOGRAM COMPLETE  Result Date: 09/09/2022    ECHOCARDIOGRAM REPORT   Patient Name:  Phineas Douglas Date of Exam: 09/09/2022 Medical Rec #:  [id number]        Height:       63.0 in Accession #:    [id number]       Weight:       117.9 lb Date of Birth:  1931-04-23        BSA:          1.545 m Patient Age:    89 years         BP:           159/70 mmHg Patient Gender: F                HR:           66 bpm. Exam Location:  Inpatient Procedure: 2D Echo, Color Doppler and Cardiac Doppler Indications:    pre-operative cardiovascular examination  History:        Patient has prior history of Echocardiogram examinations, most                 recent 02/04/2021. CAD, Defibrillator, chronic kidney disease,                 Arrythmias:Atrial Fibrillation; Risk Factors:Dyslipidemia and                 Diabetes.  Sonographer:    Juluis Rainier Referring Phys: [phone] JACOB J STINSON IMPRESSIONS  1. Left ventricular ejection fraction, by estimation, is 55 to 60%. The left ventricle has normal function. The left ventricle has no regional wall motion abnormalities. There is mild left ventricular hypertrophy. Left ventricular diastolic  parameters are indeterminate.  2. Right ventricular systolic function is normal. The right ventricular size is normal. There is mildly elevated pulmonary artery systolic pressure. The estimated right ventricular systolic pressure is 39.7 mmHg.  3. Left atrial size was moderately dilated.  4. The mitral valve is degenerative. Trivial mitral valve regurgitation. No evidence of mitral stenosis. Moderate mitral annular calcification.  5. The aortic valve is tricuspid. Aortic valve regurgitation is mild. Aortic valve sclerosis is present, with no evidence of aortic valve stenosis.  6. Aortic dilatation noted. There is dilatation of the ascending aorta, measuring 40 mm.  7. The inferior vena cava is normal in size with greater than 50% respiratory variability, suggesting right atrial pressure of 3 mmHg. FINDINGS  Left Ventricle: Left ventricular ejection fraction, by estimation, is 55 to 60%. The left ventricle has normal function. The left ventricle has no regional wall motion abnormalities. The left ventricular internal cavity size was normal in size. There is  mild left ventricular hypertrophy. Left ventricular diastolic parameters are indeterminate. Right Ventricle: The right ventricular size is normal. No increase in right ventricular wall thickness. Right ventricular systolic function is normal. There is mildly elevated pulmonary artery systolic pressure. The tricuspid regurgitant velocity is 3.03  m/s, and with an assumed right atrial pressure of 3 mmHg, the estimated right ventricular systolic pressure is 39.7 mmHg. Left Atrium: Left atrial size was moderately dilated. Right Atrium: Right atrial size was not well visualized. Pericardium: Trivial pericardial effusion is present. Mitral Valve: The mitral valve is degenerative in appearance. Moderate mitral annular calcification. Trivial mitral valve regurgitation. No evidence of mitral valve stenosis. Tricuspid Valve: The tricuspid valve is normal in structure.  Tricuspid valve regurgitation is trivial. Aortic Valve: The aortic valve is tricuspid. Aortic valve regurgitation is mild. Aortic valve sclerosis is present, with no evidence of aortic valve stenosis. Pulmonic Valve: The  pulmonic valve was not well visualized. Pulmonic valve regurgitation is trivial. Aorta: The aortic root is normal in size and structure and aortic dilatation noted. There is dilatation of the ascending aorta, measuring 40 mm. Venous: The inferior vena cava is normal in size with greater than 50% respiratory variability, suggesting right atrial pressure of 3 mmHg. IAS/Shunts: The interatrial septum was not well visualized.  LEFT VENTRICLE PLAX 2D LVIDd:         4.40 cm   Diastology LVIDs:         2.90 cm   LV e' medial:  3.92 cm/s LV PW:         1.10 cm   LV e' lateral: 6.64 cm/s LV IVS:        1.20 cm LVOT diam:     2.10 cm LV SV:         62 LV SV Index:   40 LVOT Area:     3.46 cm  RIGHT VENTRICLE             IVC RV S prime:     11.10 cm/s  IVC diam: 1.60 cm TAPSE (M-mode): 1.4 cm LEFT ATRIUM             Index LA diam:        3.60 cm 2.33 cm/m LA Vol (A2C):   76.1 ml 49.26 ml/m LA Vol (A4C):   46.9 ml 30.36 ml/m LA Biplane Vol: 62.6 ml 40.52 ml/m  AORTIC VALVE LVOT Vmax:   89.10 cm/s LVOT Vmean:  58.300 cm/s LVOT VTI:    0.178 m  AORTA Ao Root diam: 3.30 cm Ao Asc diam:  4.00 cm TRICUSPID VALVE TR Peak grad:   36.7 mmHg TR Vmax:        303.00 cm/s  SHUNTS Systemic VTI:  0.18 m Systemic Diam: 2.10 cm Epifanio Lesches MD Electronically signed by Epifanio Lesches MD Signature Date/Time: 09/09/2022/5:48:01 PM    Final    DG HIP UNILAT WITH PELVIS 1V LEFT  Result Date: 09/09/2022 CLINICAL DATA:  Hip fracture. EXAM: DG HIP (WITH OR WITHOUT PELVIS) 1V*L* COMPARISON:  Preoperative imaging. FINDINGS: Two fluoroscopic spot views of the left hip obtained in the operating room. Femoral intramedullary nail with trans trochanteric and distal locking screw fixation traverse proximal femur  fracture. Fluoroscopy time 42 seconds. Dose 5.01 mGy. IMPRESSION: Intraoperative fluoroscopy during left hip fracture ORIF. Electronically Signed   By: Narda Rutherford M.D.   On: 09/09/2022 14:36   DG C-Arm 1-60 Min-No Report  Result Date: 09/09/2022 Fluoroscopy was utilized by the requesting physician.  No radiographic interpretation.   DG Hip Unilat W or Wo Pelvis 2-3 Views Left  Result Date: 09/08/2022 CLINICAL DATA:  Pain after fall EXAM: DG HIP (WITH OR WITHOUT PELVIS) 3V LEFT COMPARISON:  None Available. FINDINGS: Severe osteopenia. There is a angulated comminuted intertrochanteric left hip fracture with involvement specifically of the lesser trochanter. No additional fracture or dislocation. Degenerative changes seen of the sacroiliac joints and pubic symphysis. Vascular calcifications are seen. IMPRESSION: Severe osteopenia with a angulated comminuted and foreshortened intertrochanteric left hip fracture. Electronically Signed   By: Karen Kays M.D.   On: 09/08/2022 17:56   DG Abd 2 Views  Result Date: 09/07/2022 CLINICAL DATA:  Periumbilical abdominal pain. Change in bowel function. Abdominal bloating. Question constipation. EXAM: ABDOMEN - 2 VIEW COMPARISON:  Abdominal CT 07/06/2020 FINDINGS: Supine and upright views of the abdomen obtained. Cardiac pacemaker wires project over the central upper abdomen. Multiple  bilateral renal calcifications, nephrocalcinosis on prior CT. Curvilinear calcifications in the right mid abdomen may represent layering gallstones. Air throughout the colon which is not appear dilated, few air-fluid levels are seen on upright view. There is no significant formed stool in the colon. No small bowel distension or evidence of obstruction. Prominent scoliosis in the lumbar spine. IMPRESSION: 1. Air throughout the colon with air-fluid levels on the upright view, typically fluid in the colon/diarrheal process. No significant formed stool in the colon to suggest  constipation. 2. Multiple bilateral renal calcifications, nephrocalcinosis on prior CT. 3. Probable gallstones. Electronically Signed   By: Narda Rutherford M.D.   On: 09/07/2022 15:52   CUP PACEART REMOTE DEVICE CHECK  Result Date: 09/01/2022 Scheduled remote reviewed. Normal device function.  Next remote 91 days. LA, CVRS   Microbiology: Results for orders placed or performed during the hospital encounter of 09/08/22  Surgical pcr screen     Status: None   Collection Time: 09/08/22 10:06 PM   Specimen: Nasal Mucosa; Nasal Swab  Result Value Ref Range Status   MRSA, PCR NEGATIVE NEGATIVE Final   Staphylococcus aureus NEGATIVE NEGATIVE Final    Comment: (NOTE) The Xpert SA Assay (FDA approved for NASAL specimens in patients 6 years of age and older), is one component of a comprehensive surveillance program. It is not intended to diagnose infection nor to guide or monitor treatment. Performed at [facility], [address], [place], [place] [address]    *Note: Due to a large number of results and/or encounters for the requested time period, some results have not been displayed. A complete set of results can be found in Results Review.   Labs: CBC: Recent Labs  Lab 09/09/22 0358 09/10/22 0346 09/11/22 1130 09/11/22 1711 09/12/22 0709  WBC 8.0 12.1* 8.0 8.5 8.6  NEUTROABS  --   --  5.9  --   --   HGB 10.0* 9.7* 7.3* 8.1* 8.7*  HCT 31.2* 30.5* 22.2* 24.6* 26.3*  MCV 96.0 99.0 95.7 95.0 95.3  PLT 215 179 170 181 222   Basic Metabolic Panel: Recent Labs  Lab 09/09/22 0358 09/10/22 1159 09/11/22 0352 09/11/22 1130 09/12/22 0709  NA 136 128* 129* 131* 131*  K 3.0* 3.2* 4.2 4.0 3.1*  CL 104 96* 99 97* 102  CO2 24 21* 23 22 24   GLUCOSE 140* 235* 127* 159* 152*  BUN 24* 29* 34* 32* 28*  CREATININE 1.60* 1.93* 2.05* 1.96* 1.43*  CALCIUM 8.5* 8.3* 8.1* 8.6* 8.2*  MG  --  1.9 2.0  --  2.2   Liver Function Tests: No results for input(s): "AST", "ALT",  "ALKPHOS", "BILITOT", "PROT", "ALBUMIN" in the last 168 hours. CBG: Recent Labs  Lab 09/08/22 1711 09/09/22 1344  GLUCAP 122* 115*    Discharge time spent: greater than 30 minutes.  Author: Lynden Oxford, MD  [facility]

## 2022-09-12 NOTE — Progress Notes (Signed)
RN called [facility] again to give report. Was on hold for extended amount of time. Person answering telephone came back and explained nurse must be on break. They are aware this is second time calling and RNs number is on packet.

## 2022-09-12 NOTE — Progress Notes (Signed)
Remote ICD transmission.   

## 2022-09-12 NOTE — TOC Transition Note (Addendum)
Transition of Care [facility]) - CM/SW Discharge Note   Patient Details  Name: [name] MRN: [id number] Date of Birth: 1931/03/21  [facility] CM/SW Contact:  Darleene Cleaver, LCSW Phone Number: 09/12/2022, 11:27 AM   Clinical Narrative:     CSW spoke to attending physician, patient is medically ready for discharge today.  CSW spoke to family they have chosen [facility] for rehab, and per Donnal Debar can accept patient today.    CSW updated bedside nurse and attending physician.  Per Marchelle Folks at [facility] they will transport patient between 1:30pm and 2pm, they will call nurse's station once they arrive.    Patient to be d/c'ed today to [facility].  Patient and family agreeable to plans will transport via [place] transportation RN to call report to [phone].  Patient's son and daughter in law are aware of patient discharging today.    Final next level of care: Skilled Nursing Facility Barriers to Discharge: Barriers Resolved   Patient Goals and CMS Choice CMS Medicare.gov Compare Post Acute Care list provided to:: Patient Represenative (must comment) Choice offered to / list presented to : Adult Children  Discharge Placement     Existing PASRR number confirmed : 09/11/22          Patient chooses bed at: [facility] Patient to be transferred to facility by: [facility] Name of family member notified: Jeannine Boga Patient and family notified of of transfer: 09/12/22  Discharge Plan and Services Additional resources added to the After Visit Summary for       Post Acute Care Choice: Skilled Nursing Facility                               Social Determinants of Health (SDOH) Interventions SDOH Screenings   Food Insecurity: No Food Insecurity (09/08/2022)  Housing: Low Risk  (09/08/2022)  Transportation Needs: No Transportation Needs (09/08/2022)  Utilities: Not At Risk (09/08/2022)  Alcohol Screen: Low Risk  (09/27/2020)   Depression (PHQ2-9): Low Risk  (07/11/2021)  Financial Resource Strain: Low Risk  (09/27/2020)  Physical Activity: Inactive (03/15/2021)  Social Connections: Moderately Isolated (09/27/2020)  Stress: Stress Concern Present (03/15/2021)  Tobacco Use: Low Risk  (09/08/2022)     Readmission Risk Interventions    06/23/2020   12:44 PM 03/28/2020    2:58 PM  Readmission Risk Prevention Plan  Transportation Screening  Complete  Medication Review (RN Care Manager) Complete Complete  PCP or Specialist appointment within 3-5 days of discharge Complete   HRI or Home Care Consult Complete Complete  SW Recovery Care/Counseling Consult Complete Complete  Palliative Care Screening Not Applicable Not Applicable  Skilled Nursing Facility Patient Refused Not Applicable

## 2022-09-12 NOTE — Progress Notes (Signed)
RN called [facility] to give report. Per Gavin Pound, nurse is at lunch. Gavin Pound is aware RN will attempt to call again to give report. If RN can not reach nurse at [facility] to give report at a later time, Gavin Pound is aware RNs telephone number will be on packet.

## 2022-10-17 ENCOUNTER — Other Ambulatory Visit: Payer: Self-pay

## 2022-10-17 ENCOUNTER — Emergency Department (HOSPITAL_COMMUNITY)
Admission: EM | Admit: 2022-10-17 | Discharge: 2022-10-17 | Disposition: A | Discharge status: Home / Self Care | Payer: Medicare (Managed Care) | Attending: Emergency Medicine | Admitting: Emergency Medicine

## 2022-10-17 ENCOUNTER — Emergency Department (HOSPITAL_COMMUNITY): Payer: Medicare (Managed Care)

## 2022-10-17 DIAGNOSIS — R197 Diarrhea, unspecified: Secondary | ICD-10-CM | POA: Diagnosis not present

## 2022-10-17 DIAGNOSIS — R1084 Generalized abdominal pain: Secondary | ICD-10-CM | POA: Diagnosis present

## 2022-10-17 DIAGNOSIS — N3000 Acute cystitis without hematuria: Secondary | ICD-10-CM | POA: Diagnosis not present

## 2022-10-17 LAB — CBC WITH DIFFERENTIAL/PLATELET
Abs Immature Granulocytes: 0.05 10*3/uL (ref 0.00–0.07)
Basophils Absolute: 0.1 10*3/uL (ref 0.0–0.1)
Basophils Relative: 1 %
Eosinophils Absolute: 0.4 10*3/uL (ref 0.0–0.5)
Eosinophils Relative: 7 %
HCT: 32.2 % — ABNORMAL LOW (ref 36.0–46.0)
Hemoglobin: 10.3 g/dL — ABNORMAL LOW (ref 12.0–15.0)
Immature Granulocytes: 1 %
Lymphocytes Relative: 33 %
Lymphs Abs: 2.1 10*3/uL (ref 0.7–4.0)
MCH: 30.6 pg (ref 26.0–34.0)
MCHC: 32 g/dL (ref 30.0–36.0)
MCV: 95.5 fL (ref 80.0–100.0)
Monocytes Absolute: 0.5 10*3/uL (ref 0.1–1.0)
Monocytes Relative: 8 %
Neutro Abs: 3.1 10*3/uL (ref 1.7–7.7)
Neutrophils Relative %: 50 %
Platelets: 356 10*3/uL (ref 150–400)
RBC: 3.37 MIL/uL — ABNORMAL LOW (ref 3.87–5.11)
RDW: 16.7 % — ABNORMAL HIGH (ref 11.5–15.5)
WBC: 6.3 10*3/uL (ref 4.0–10.5)
nRBC: 0 % (ref 0.0–0.2)

## 2022-10-17 LAB — URINALYSIS, ROUTINE W REFLEX MICROSCOPIC
Bilirubin Urine: NEGATIVE
Glucose, UA: NEGATIVE mg/dL
Ketones, ur: NEGATIVE mg/dL
Nitrite: NEGATIVE
Protein, ur: NEGATIVE mg/dL
Specific Gravity, Urine: 1.003 — ABNORMAL LOW (ref 1.005–1.030)
WBC, UA: >50 WBC/hpf (ref 0–5)
pH: 5 (ref 5.0–8.0)

## 2022-10-17 LAB — COMPREHENSIVE METABOLIC PANEL
ALT: 21 U/L (ref 0–44)
AST: 32 U/L (ref 15–41)
Albumin: 3 g/dL — ABNORMAL LOW (ref 3.5–5.0)
Alkaline Phosphatase: 131 U/L — ABNORMAL HIGH (ref 38–126)
Anion gap: 11 (ref 5–15)
BUN: 12 mg/dL (ref 8–23)
CO2: 22 mmol/L (ref 22–32)
Calcium: 8.9 mg/dL (ref 8.9–10.3)
Chloride: 102 mmol/L (ref 98–111)
Creatinine, Ser: 1.13 mg/dL — ABNORMAL HIGH (ref 0.44–1.00)
GFR, Estimated: 47 mL/min — ABNORMAL LOW (ref >60)
Glucose, Bld: 122 mg/dL — ABNORMAL HIGH (ref 70–99)
Potassium: 3.6 mmol/L (ref 3.5–5.1)
Sodium: 135 mmol/L (ref 135–145)
Total Bilirubin: 0.5 mg/dL (ref 0.3–1.2)
Total Protein: 6.3 g/dL — ABNORMAL LOW (ref 6.5–8.1)

## 2022-10-17 LAB — LIPASE, BLOOD: Lipase: 45 U/L (ref 11–51)

## 2022-10-17 MED ORDER — MORPHINE SULFATE (PF) 4 MG/ML IV SOLN
4.0000 mg | Freq: Once | INTRAVENOUS | Status: AC
  Administered 2022-10-17: 4 mg via INTRAVENOUS
  Filled 2022-10-17: qty 1

## 2022-10-17 MED ORDER — SODIUM CHLORIDE 0.9 % IV SOLN
1.0000 g | Freq: Once | INTRAVENOUS | Status: AC
  Administered 2022-10-17: 1 g via INTRAVENOUS
  Filled 2022-10-17: qty 10

## 2022-10-17 MED ORDER — LACTATED RINGERS IV BOLUS
500.0000 mL | Freq: Once | INTRAVENOUS | Status: AC
  Administered 2022-10-17: 500 mL via INTRAVENOUS

## 2022-10-17 MED ORDER — CEPHALEXIN 500 MG PO CAPS: 500.0000 mg | ORAL_CAPSULE | Freq: Two times a day (BID) | ORAL | 0 refills | Status: DC

## 2022-10-17 NOTE — Discharge Instructions (Addendum)
Your were found to have a UTI.  Take antibiotics as directed.  This should help with your symptoms.  Follow-up with your doctor.

## 2022-10-17 NOTE — ED Triage Notes (Signed)
Pt arrives to [facility]/o lower abdominal pain x 2 days. Pt endorses nausea and vomiting. Pt was given tylenol and oxycodone at [facility] with no relief. Pt endorses diarrhea

## 2022-10-17 NOTE — Discharge Planning (Signed)
Pt from [place] and currently active with [facility]  as confirmed by RNCM with Anada, SW of [facility].  Please contact Ananda at [phone] for needs including transportation back to facility.

## 2022-10-17 NOTE — ED Provider Notes (Signed)
[facility] Emergency Department Provider Note MRN:  [id number]  Arrival date & time: 10/17/22     Chief Complaint   Abdominal Pain   History of Present Illness   [name] is a 89 y.o. year-old female presents to the ED with chief complaint of abdominal pain.  Onset 2 days ago.  Reports associated nausea, vomiting, and diarrhea.  Has taken Tylenol and Oxycodone without much relief.  Denies fever or chills.  Denies dysuria.    Hx of IBS with diarrhea.  History provided by patient.   Review of Systems  Pertinent positive and negative review of systems noted in HPI.    Physical Exam   Vitals:   10/17/22 0430 10/17/22 0530  BP: (!) 163/66 (!) 162/65  Pulse: (!) 58 (!) 57  Resp: 17 12  Temp:    SpO2: 99% 98%    CONSTITUTIONAL:  elderly-appearing, NAD NEURO:  Alert and oriented x 3, CN 3-12 grossly intact EYES:  eyes equal and reactive ENT/NECK:  Supple, no stridor  CARDIO:  normal rate, regular rhythm, appears well-perfused  PULM:  No respiratory distress, CTAB GI/GU:  non-distended, generalized abdominal discomfort, but without focal tenderness MSK/SPINE:  No gross deformities, no edema, moves all extremities  SKIN:  no rash, atraumatic   *Additional and/or pertinent findings included in MDM below  Diagnostic and Interventional Summary    EKG Interpretation Date/Time:    Ventricular Rate:    PR Interval:    QRS Duration:    QT Interval:    QTC Calculation:   R Axis:      Text Interpretation:         Labs Reviewed  COMPREHENSIVE METABOLIC PANEL - Abnormal; Notable for the following components:      Result Value   Glucose, Bld 122 (*)    Creatinine, Ser 1.13 (*)    Total Protein 6.3 (*)    Albumin 3.0 (*)    Alkaline Phosphatase 131 (*)    GFR, Estimated 47 (*)    All other components within normal limits  CBC WITH DIFFERENTIAL/PLATELET - Abnormal; Notable for the following components:   RBC 3.37 (*)    Hemoglobin  10.3 (*)    HCT 32.2 (*)    RDW 16.7 (*)    All other components within normal limits  URINALYSIS, ROUTINE W REFLEX MICROSCOPIC - Abnormal; Notable for the following components:   APPearance HAZY (*)    Specific Gravity, Urine 1.003 (*)    Hgb urine dipstick LARGE (*)    Leukocytes,Ua LARGE (*)    Bacteria, UA MANY (*)    Non Squamous Epithelial 0-5 (*)    All other components within normal limits  LIPASE, BLOOD    CT ABDOMEN PELVIS WO CONTRAST  Final Result      Medications  lactated ringers bolus 500 mL (0 mLs Intravenous Stopped 10/17/22 0332)  morphine (PF) 4 MG/ML injection 4 mg (4 mg Intravenous Given 10/17/22 0301)  cefTRIAXone (ROCEPHIN) 1 g in sodium chloride 0.9 % 100 mL IVPB (0 g Intravenous Stopped 10/17/22 0432)     Procedures  /  Critical Care Procedures  ED Course and Medical Decision Making  I have reviewed the triage vital signs, the nursing notes, and pertinent available records from the EMR.  Social Determinants Affecting Complexity of Care: Patient has no clinically significant social determinants affecting this chief complaint..   ED Course: Clinical Course as of 10/17/22 0610  Tue Oct 17, 2022  0609 Urinalysis,  Routine w reflex microscopic -Urine, Clean Catch(!) Worrisome for infection [RB]  0609 Comprehensive metabolic panel(!) No significant electrolyte derangement  [RB]  0609 CT ABDOMEN PELVIS WO CONTRAST No large volume free fluid or air [RB]    Clinical Course User Index [RB] Roxy Horseman, PA-C    Medical Decision Making Patient her with right flank and lower abdominal pain for the past few days.   Hx of IBS with diarrhea.  Will check labs and imaging given age.  CT and UA are worrisome for cystitis.  This seems to match with the description of her symptoms.  Will treat with keflex.  I attempted to call the patient's son to update him on results, but was unable to reach him.  Amount and/or Complexity of Data Reviewed Labs: ordered.  Decision-making details documented in ED Course. Radiology: ordered and independent interpretation performed. Decision-making details documented in ED Course.  Risk Prescription drug management.       Ddx includes, but not limited to: appy, cholecystitis, pancreatitis, KS, UTI/pyelo,  Doubt appy, no focal RLQ tenderness at McBurney's point. Normal lipase, doubt pancreatitis. LFTs reassuring, no focal RUQ tenderness, doubt cholecystitis.  Consultants: No consultations were needed in caring for this patient.   Treatment and Plan: I considered admission due to patient's initial presentation, but after considering the examination and diagnostic results, patient will not require admission and can be discharged with outpatient follow-up.    Final Clinical Impressions(s) / ED Diagnoses     ICD-10-CM   1. Acute cystitis without hematuria  N30.00       ED Discharge Orders          Ordered    cephALEXin (KEFLEX) 500 MG capsule  2 times daily        10/17/22 0605              Discharge Instructions Discussed with and Provided to Patient:     Discharge Instructions      Your were found to have a UTI.  Take antibiotics as directed.  This should help with your symptoms.  Follow-up with your doctor.       Roxy Horseman, PA-C 10/17/22 1610    Shon Baton, MD 10/17/22 [phone]

## 2022-11-21 ENCOUNTER — Encounter: Payer: Self-pay | Admitting: Cardiovascular Disease

## 2022-11-21 ENCOUNTER — Ambulatory Visit: Payer: Medicare (Managed Care) | Attending: Cardiovascular Disease | Admitting: Cardiovascular Disease

## 2022-11-21 VITALS — BP 192/70 | HR 55 | Ht 63.0 in | Wt 115.0 lb

## 2022-11-21 DIAGNOSIS — I259 Chronic ischemic heart disease, unspecified: Secondary | ICD-10-CM

## 2022-11-21 DIAGNOSIS — Z79899 Other long term (current) drug therapy: Secondary | ICD-10-CM

## 2022-11-21 DIAGNOSIS — Z5181 Encounter for therapeutic drug level monitoring: Secondary | ICD-10-CM

## 2022-11-21 DIAGNOSIS — D6869 Other thrombophilia: Secondary | ICD-10-CM

## 2022-11-21 DIAGNOSIS — Z95810 Presence of automatic (implantable) cardiac defibrillator: Secondary | ICD-10-CM

## 2022-11-21 DIAGNOSIS — T50905D Adverse effect of unspecified drugs, medicaments and biological substances, subsequent encounter: Secondary | ICD-10-CM

## 2022-11-21 DIAGNOSIS — R001 Bradycardia, unspecified: Secondary | ICD-10-CM | POA: Diagnosis not present

## 2022-11-21 DIAGNOSIS — N1831 Chronic kidney disease, stage 3a: Secondary | ICD-10-CM

## 2022-11-21 DIAGNOSIS — I4729 Other ventricular tachycardia: Secondary | ICD-10-CM

## 2022-11-21 DIAGNOSIS — I480 Paroxysmal atrial fibrillation: Secondary | ICD-10-CM

## 2022-11-21 DIAGNOSIS — I25119 Atherosclerotic heart disease of native coronary artery with unspecified angina pectoris: Secondary | ICD-10-CM | POA: Diagnosis not present

## 2022-11-21 NOTE — Patient Instructions (Signed)
Medication Instructions:  No changes *If you need a refill on your cardiac medications before your next appointment, please call your pharmacy*   Follow-Up: At [facility], you and your health needs are our priority.  As part of our continuing mission to provide you with exceptional heart care, we have created designated Provider Care Teams.  These Care Teams include your primary Cardiologist (physician) and Advanced Practice Providers (APPs -  Physician Assistants and Nurse Practitioners) who all work together to provide you with the care you need, when you need it.  We recommend signing up for the patient portal called "MyChart".  Sign up information is provided on this After Visit Summary.  MyChart is used to connect with patients for Virtual Visits (Telemedicine).  Patients are able to view lab/test results, encounter notes, upcoming appointments, etc.  Non-urgent messages can be sent to your provider as well.   To learn more about what you can do with MyChart, go to [email or web address].    Your next appointment:   6 month(s)  Provider:   Mihai Croitoru, MD     

## 2022-11-21 NOTE — Progress Notes (Signed)
. Patient ID: [name], female   DOB: 1931-03-14, 89 y.o.   MRN: [id number]    Cardiology Office Note    Date:  11/21/2022   ID:  [name], DOB Jun 29, 1931, MRN [id number]  PCP:  Bayard Males, NP (Inactive)  Cardiologist:   Thurmon Fair, MD   Chief Complaint  Patient presents with   Atrial Fibrillation     History of Present Illness:  [name] is a 89 y.o. 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  Since her last appointment she suffered a left intertrochanteric hip fracture treated with intramedullary nail on 09/09/2022.  She had a mechanical fall that was not preceded by syncope.  She did not have any problems with atrial or ventricular arrhythmia during her hospital stay.  There was no serious bleeding although she was on Eliquis.  Despite her multiple comorbid conditions she's recovered quite well.  She is now back home.  She has not had any new falls.  She has not had orthopnea, PND, chest pain, dizziness or syncope or new focal neurological complaints.  Cognitive deficits remain mild and appear unchanged.  She has lost even more weight and her BMI now is around 20.  Her ICD sticks out very prominently in the left chest, but the thin skin still appears healthy and there is no sign of infection.  She had already lost weight after her COVID-19 infection last November when she had symptoms suggestive of transient shock.  Her most recent creatinine was 1.13,  better than her previous baseline and her most recent hemoglobin was 10.3.  Her most recent lipid profile was excellent.  She has not had palpitations although she reports that at one occasion at home her blood pressure cuff told her that her heart rate was 140.  Interrogation of her defibrillator does not show any episodes of high ventricular rate.  Estimated generator longevity is 8.5 years.  All lead parameters are normal.  The heart rate histogram shows transient increased heart rate at the time of her hip fracture admission, but none since.  Her activity level, which was very low following the hip fracture has been slowly increasing and is now almost an hour a day.  Her average heart rate is in the mid 50s.  She has not required any pacing (she has a single chamber device programmed at a lower rate limit of 45 bpm).  Heart rate histogram distribution actually appears appropriate.  Interestingly, her ECG which has shown LBBB for many years today shows a narrow QRS complex, consistent with incomplete right bundle branch block.  A single PVC is seen.  We have been trying to avoid ventricular pacing by keeping her lower rate limit low at 45 bpm and decreasing her beta-blocker dose.  She does not have an atrial lead and has declined invasive procedures such as upgrade of her device to a dual-chamber device.  She also reiterates the fact that she does not want to have invasive procedures such as cardiac catheterization.     CT of the abdomen 2022 described a focal dissection of the infrarenal  aorta (old).  There was no evidence of acute mesenteric ischemia, but the patient was diagnosed as probably having intestinal angina with microvascular etiology (versus bowel hypersensitivity), and improvement was reportedly noted with isosorbide mononitrate 30 mg daily..  Her abdominal pain is a "knotting sensation",  clearly worsened by food and she has developed some degree of food aversion.  She has to eat very  small meals.    She was hospitalized in June 2022 with cellulitis related to an ulceration of the left great toe for about 4 days.  She was seen again in the emergency room with left leg pain (without evidence of recurrent infection) had a CT angiogram of the chest abdomen and pelvis, describing the presence of aortic atherosclerosis and "old appearing focal dissection flap or a penetrating ulcer in the distal abdominal aorta, lumen measures up to 2.1 cm at this level, no acute dissection, no aneurysm".  The focal dissection was also present on the study in March 2022.  The celiac artery, superior mesenteric artery and inferior mesenteric artery are specifically described as patent.  The right renal artery described as irregular, possible fibromuscular dysplasia without mention of actual stenosis.  There was no evidence of hepatic biliary duct dilation or gallstones.  Indeterminant 7 mm hypodense lesion in the neck of the pancreas was described, although this was not seen on the study from March 2022 and February 2020.  She has extensive chronic nephrolithiasis/nephrocalcinosis and T12 and L1 compression fractures which are old  In recent years she has had increasing problems with symptomatic atrial fibrillation.  Although she has a single chamber defibrillator, making it impossible to precisely diagnose the episodes of atrial fibrillation, there is clear evidence of periods of increased ventricular rate from her baseline, likely representing symptomatic atrial fibrillation.  She develops palpitation, chest discomfort, shortness of breath and if the spells are lengthy she eventually develops overt lower extremity edema.  She underwent a cardioversion in January 2021 from atrial fibrillation to normal rhythm, associated with improvement in symptoms.  She has been taking amiodarone since February.  She was hospitalized with dizziness, orthostatic hypotension and severe hypothyroidism (TSH 56) in July 2021  It is  likely she had iatrogenic hypothyroidism due to amiodarone.  Levothyroxine was started, beta-blocker therapy was discontinued.  She was on treatment with disopyramide for years for her history of catecholaminergic polymorphic VT, but this medication had to be gradually discontinued due to side effects, especially orthostatic hypotension and dry mouth.  In the past she developed torsades de pointes with sotalol and diarrhea with quinidine.  She has not had defibrillator discharges since the 1990s.  Had severe COVID-19 infection with septic shock in November 2023, complicated by weight loss.  Had a mechanical fall with left hip fracture and intramedullary nail placement in August 2024.   Past Medical History:  Diagnosis Date   AICD (automatic cardioverter/defibrillator) present    Allergy    SESONAL   Aneurysm of infrarenal abdominal aorta (HCC)    Anxiety    ARTHRITIS    Arthritis    Atrial fibrillation (HCC)    CAD (coronary artery disease)    a. history of cardiac arrest 1993;  b. s/p LAD/LCX stenting in 2003;  c. 07/2011 Cath: LM nl, LAD patent stent, D1 80ost, LCX patent stent, RCA min irregs;  d. 04/2012 MV: EF 66%, no ishcemia;  e. 12/2013 Echo: Ef 55%, no rwma, Gr 1 DD, triv AI/MR, mildly dil LA;  f. 12/2013 Lexi MV: intermediate risk -  apical ischemia and inf/infsept fixed defect, ? artifactual.   CAD in native artery 12/31/2013   Previous stents to LAD and Ramus patent on cath today 12/31/13 also There is severe disease in the ostial first diagonal which is unchanged from most recent cardiac catheterization. The right coronary artery could not be engaged selectively but nonselective angiography showed no significant disease in the proximal and midsegment.       Cataract    DENIES   Chronic back pain    Chronic sinus bradycardia    CKD (chronic kidney disease), stage III (HCC)    Clotting disorder (HCC)    DVT   COLITIS 12/02/2007   Qualifier: Diagnosis of  By: Koleen Distance CMA Duncan Dull),  Hulan Saas     Diabetes mellitus without complication (HCC)    DENIES   Diverticulosis of colon (without mention of hemorrhage) 2007   Colonoscopy    Esophageal stricture    a. 2012 s/p dil.   Esophagitis, unspecified    a. 2012 EGD   EXTERNAL HEMORRHOIDS    GERD (gastroesophageal reflux disease)    omeprazole   H/O hiatal hernia    Hiatal hernia    a. 2012 EGD.   History of DVT in the past, not on Coumadin now    left  leg   HYPERCHOLESTEROLEMIA    Hypertension    ICD (implantable cardiac defibrillator) in place    a. s/p initial ICD in 1993 in setting of cardiac arrest;  b. 01/2007 gen change: Guidant T135 Vitality DS VR single lead ICD.   Macular degeneration    gets injecton in eye every 5 weeks- last injection - 05/03/2013    Myocardial infarction [facility]) 1993   Near syncope 05/22/2015   Nephrolithiasis, just saw Dr Annabell Howells- "OK"    Orthostatic hypotension    Peripheral vascular disease (HCC)    ???   RECTAL BLEEDING 12/03/2007   Qualifier: Diagnosis of  By: Jarold Motto MD Mervyn Skeeters R    Unspecified gastritis and gastroduodenitis without mention of hemorrhage    a. 2003 EGD->not noted on 2012 EGD.   Unstable angina, neg MI, cath stable.maybe GI 12/30/2013    Past Surgical History:  Procedure Laterality Date   BREAST BIOPSY Left 11/2013   CARDIAC CATHETERIZATION     CARDIAC CATHETERIZATION  01/01/15   patent stents -there is severe disease in ostial 1st diag but without change.    CARDIAC DEFIBRILLATOR PLACEMENT  02/05/2007   Guidant   CARDIOVASCULAR STRESS TEST  12/30/2013   abnormal   CARDIOVERSION N/A 02/13/2019   Procedure: CARDIOVERSION;  Surgeon: Thurmon Fair, MD;  Location: [facility] ENDOSCOPY;  Service: Cardiovascular;  Laterality: N/A;   CATARACT EXTRACTION Right    Dr. Dione Booze   COLONOSCOPY     COLONOSCOPY WITH PROPOFOL N/A 03/30/2020   Procedure: COLONOSCOPY WITH PROPOFOL;  Surgeon: Dolores Frame, MD;  Location: [facility];  Service: Gastroenterology;   Laterality: N/A;   coronary stents      CYSTOSCOPY WITH URETEROSCOPY Right 05/20/2013   Procedure: CYSTOSCOPY, RIGHT URETEROSCOPY STONE EXTRACTION, Insertion of right DOUBLE J STENT ;  Surgeon: Bjorn Pippin, MD;  Location: [facility] ORS;  Service: Urology;  Laterality: Right;   CYSTOSCOPY WITH URETEROSCOPY AND STENT PLACEMENT N/A 06/03/2013   Procedure: SECOND LOOK CYSTOSCOPY WITH URETEROSCOPY  HOLMIUM LASER LITHO AND STONE EXTRACTION Newman Nip ;  Surgeon: Anner Crete, MD;  Location: [facility] OR[facility];  Service: Urology;  Laterality: N/A;   ESOPHAGOGASTRODUODENOSCOPY (EGD) WITH PROPOFOL N/A 11/20/2019   Procedure:  ESOPHAGOGASTRODUODENOSCOPY (EGD) WITH PROPOFOL;  Surgeon: Corbin Ade, MD;  Location: [facility];  Service: Endoscopy;  Laterality: N/A;   GIVENS CAPSULE STUDY N/A 10/30/2016   Procedure: GIVENS CAPSULE STUDY;  Surgeon: Hilarie Fredrickson, MD;  Location: [facility] ENDOSCOPY;  Service: Endoscopy;  Laterality: N/A;  NEEDS TO BE ADMITTED FOR OBSERVATION PT. HAS DEFIB   HEMORROIDECTOMY  80's   HOLMIUM LASER APPLICATION Right 05/20/2013   Procedure: HOLMIUM LASER APPLICATION;  Surgeon: Bjorn Pippin, MD;  Location: [facility];  Service: Urology;  Laterality: Right;   HYSTEROSCOPY WITH D & C  09/02/2010   Procedure: DILATATION AND CURETTAGE (D&C) /HYSTEROSCOPY;  Surgeon: Meriel Pica;  Location: [facility] OR[facility];  Service: Gynecology;  Laterality: N/A;  Dilation and Curettage with Hysteroscopy and Polypectomy   IMPLANTABLE CARDIOVERTER DEFIBRILLATOR (ICD) GENERATOR CHANGE N/A 02/10/2014   Procedure: ICD GENERATOR CHANGE;  Surgeon: Thurmon Fair, MD;  Location: [facility] CATH LAB;  Service: Cardiovascular;  Laterality: N/A;   INTRAMEDULLARY (IM) NAIL INTERTROCHANTERIC Left 09/09/2022   Procedure: INTRAMEDULLARY (IM) NAIL INTERTROCHANTERIC;  Surgeon: Durene Romans, MD;  Location: [facility];  Service: Orthopedics;  Laterality: Left;   LEFT HEART CATHETERIZATION WITH CORONARY ANGIOGRAM N/A 07/28/2011   Procedure: LEFT HEART CATHETERIZATION WITH  CORONARY ANGIOGRAM;  Surgeon: Runell Gess, MD;  Location: [facility] CATH LAB;  Service: Cardiovascular;  Laterality: N/A;   LEFT HEART CATHETERIZATION WITH CORONARY ANGIOGRAM N/A 12/31/2013   Procedure: LEFT HEART CATHETERIZATION WITH CORONARY ANGIOGRAM;  Surgeon: Iran Ouch, MD;  Location: [facility] CATH LAB;  Service: Cardiovascular;  Laterality: N/A;    Current Medications: Outpatient Medications Prior to Visit  Medication Sig Dispense Refill   acetaminophen (TYLENOL) 500 MG tablet Take 1 tablet (500 mg total) by mouth every 6 (six) hours as needed for mild pain, fever or headache. 30 tablet 0   Alpha-Lipoic Acid 600 MG TABS Take 600 mg by mouth daily. For neuropathy in legs (Patient taking differently: Take 100 mg by mouth daily.) 90 tablet 3   amiodarone (PACERONE) 200 MG tablet Take 1 tablet (200 mg total) by mouth daily. 90 tablet 3   amLODipine (NORVASC) 5 MG tablet Take 5 mg by mouth daily.     aspirin EC 81 MG EC tablet Take 1 tablet (81 mg total) by mouth daily. Swallow whole. 30 tablet 11   carboxymethylcellulose (REFRESH PLUS) 0.5 % SOLN Place 1 drop into both eyes 3 (three) times daily as needed (dry/irritated eyes.).     ELIQUIS 2.5 MG TABS tablet TAKE (1) TABLET TWICE A DAY. (Patient taking differently: Take 2.5 mg by mouth 2 (two) times daily.) 180 tablet 1   feeding supplement (ENSURE ENLIVE / ENSURE PLUS) LIQD Take 237 mLs by mouth 2 (two) times daily between meals. 237 mL 12   gabapentin (NEURONTIN) 100 MG capsule Take 1 capsule (100 mg total) by mouth at bedtime. 30 capsule 0   isosorbide mononitrate (IMDUR) 60 MG 24 hr tablet Take 1 tablet (60 mg total) by mouth daily. 30 tablet 11   latanoprost (XALATAN) 0.005 % ophthalmic solution Place 1 drop into the left eye at bedtime.     levothyroxine (SYNTHROID) 112 MCG tablet Take 112 mcg by mouth daily.     MELATONIN PO Take 10 mg by mouth at bedtime.     Multiple Vitamins-Minerals (CENTRUM SILVER ULTRA WOMENS PO) Take 1 tablet by  mouth every morning.     pantoprazole (PROTONIX) 40 MG tablet Take 1 tablet (40 mg total) by mouth 2 (two) times  daily before a meal. 90 tablet 0   rosuvastatin (CRESTOR) 10 MG tablet TAKE 1 TABLET DAILY 90 tablet 0   simethicone (MYLICON) 125 MG chewable tablet Chew 125 mg by mouth every 6 (six) hours as needed for flatulence.     cephALEXin (KEFLEX) 500 MG capsule Take 1 capsule (500 mg total) by mouth 2 (two) times daily. (Patient not taking: Reported on 11/21/2022) 14 capsule 0   docusate sodium (COLACE) 100 MG capsule Take 1 capsule (100 mg total) by mouth 2 (two) times daily. (Patient not taking: Reported on 11/21/2022) 10 capsule 0   methocarbamol (ROBAXIN) 500 MG tablet Take 1 tablet (500 mg total) by mouth every 8 (eight) hours as needed for muscle spasms. (Patient not taking: Reported on 11/21/2022) 15 tablet 0   metoprolol succinate (TOPROL-XL) 25 MG 24 hr tablet Take 0.5 tablets (12.5 mg total) by mouth daily. Take with or immediately following a meal. (Patient taking differently: Take 12.5 mg by mouth 2 (two) times daily. Take with or immediately following a meal.) 15 tablet 11   nitroGLYCERIN (NITROSTAT) 0.4 MG SL tablet PLACE 1 TABLET UNDER THE TONGUE AT ONSET OF CHEST PAIN EVERY 5 MINTUES UP TO 3 TIMES AS NEEDED (Patient not taking: Reported on 11/21/2022) 25 tablet 2   oxyCODONE (OXY IR/ROXICODONE) 5 MG immediate release tablet Take 1 tablet (5 mg total) by mouth every 6 (six) hours as needed for severe pain or moderate pain. (Patient not taking: Reported on 11/21/2022) 20 tablet 0   polyethylene glycol (MIRALAX / GLYCOLAX) 17 g packet Take 17 g by mouth daily as needed for mild constipation. (Patient not taking: Reported on 11/21/2022) 14 each 0   venlafaxine (EFFEXOR) 37.5 MG tablet Take 37.5 mg by mouth daily. Once a day at noon. (Patient not taking: Reported on 11/21/2022)     No facility-administered medications prior to visit.     Allergies:   Sotalol, Tramadol, Actos [pioglitazone],  Adhesive [tape], Atorvastatin, Bactrim [sulfamethoxazole-trimethoprim], Ciprofloxacin, Contrast media [iodinated contrast media], Latex, Pedi-pre tape spray [wound dressing adhesive], and Sulfa antibiotics   Social History   Socioeconomic History   Marital status: Widowed    Spouse name: Not on file   Number of children: 4   Years of education: 8   Highest education level: 8th grade  Occupational History   Occupation: Retired  Tobacco Use   Smoking status: Never   Smokeless tobacco: Never  Vaping Use   Vaping status: Never Used  Substance and Sexual Activity   Alcohol use: No   Drug use: No   Sexual activity: Never    Birth control/protection: Post-menopausal  Other Topics Concern   Not on file  Social History Narrative   Lives alone - son visits Sundays, daughter visits Saturday   Has an aide Marcha Solders) 5 days per week   Social Determinants of Health   Financial Resource Strain: Low Risk  (09/27/2020)   Overall Financial Resource Strain (CARDIA)    Difficulty of Paying Living Expenses: Not hard at all  Food Insecurity: No Food Insecurity (09/08/2022)   Hunger Vital Sign    Worried About Running Out of Food in the Last Year: Never true    Ran Out of Food in the Last Year: Never true  Transportation Needs: No Transportation Needs (09/08/2022)   PRAPARE - Administrator, Civil Service (Medical): No    Lack of Transportation (Non-Medical): No  Physical Activity: Inactive (03/15/2021)   Exercise Vital Sign  Days of Exercise per Week: 0 days    Minutes of Exercise per Session: 0 min  Stress: Stress Concern Present (03/15/2021)   [employer] of Occupational Health - Occupational Stress Questionnaire    Feeling of Stress : To some extent  Social Connections: Moderately Isolated (09/27/2020)   Social Connection and Isolation Panel [NHANES]    Frequency of Communication with Friends and Family: More than three times a week    Frequency of Social  Gatherings with Friends and Family: Twice a week    Attends Religious Services: 1 to 4 times per year    Active Member of [employer] or Organizations: No    Attends Banker Meetings: Never    Marital Status: Widowed     Family History:  The patient's family history includes Asthma in her brother; Atrial fibrillation in her sister; Cancer in her daughter; Colon polyps in her daughter; Diabetes in her daughter; Heart attack in her brother and father; Heart disease in her brother; Hyperlipidemia in her sister; Macular degeneration in her sister; Osteoporosis in her sister; Other in her father and mother; Stroke in her mother, sister, and sister; Uterine cancer in her sister.   ROS:   Please see the history of present illness.    ROS All other systems are reviewed and are negative.   PHYSICAL EXAM:   VS:  BP (!) 192/70 (BP Location: Left Arm, Patient Position: Sitting, Cuff Size: Normal)   Pulse (!) 55   Ht 5\' 3"  (1.6 m)   Wt 115 lb (52.2 kg)   SpO2 99%   BMI 20.37 kg/m       General: Alert, oriented x3, no distress, healthy ICD site.  The device sticks out prominently because of her weight loss.  The overlying skin appears thin but still healthy and does not appear adherent to the device. Head: no evidence of trauma, PERRL, EOMI, no exophtalmos or lid lag, no myxedema, no xanthelasma; normal ears, nose and oropharynx Neck: normal jugular venous pulsations and no hepatojugular reflux; brisk carotid pulses without delay and no carotid bruits Chest: clear to auscultation, no signs of consolidation by percussion or palpation, normal fremitus, symmetrical and full respiratory excursions Cardiovascular: normal position and quality of the apical impulse, regular rhythm, normal first and second heart sounds, no murmurs, rubs or gallops Abdomen: no tenderness or distention, no masses by palpation, no abnormal pulsatility or arterial bruits, normal bowel sounds, no  hepatosplenomegaly Extremities: no clubbing, cyanosis or edema; 2+ radial, ulnar and brachial pulses bilaterally; 2+ right femoral, posterior tibial and dorsalis pedis pulses; 2+ left femoral, posterior tibial and dorsalis pedis pulses; no subclavian or femoral bruits Neurological: grossly nonfocal Psych: Normal mood and affect   Wt Readings from Last 3 Encounters:  11/21/22 115 lb (52.2 kg)  10/17/22 115 lb (52.2 kg)  09/08/22 117 lb 15.1 oz (53.5 kg)    Studies/Labs Reviewed:   EKG:   EKG Interpretation Date/Time:  Tuesday November 21 2022 10:07:12 EST Ventricular Rate:  62 PR Interval:  206 QRS Duration:  114 QT Interval:  402 QTC Calculation: 408 R Axis:   -21  Text Interpretation: Sinus rhythm with occasional Premature ventricular complexes Incomplete right bundle branch block When compared with ECG of 17-Oct-2022 02:47, Premature ventricular complexes are seen and LBBB has resolved. Confirmed by [name] [id number]) on 11/21/2022 10:21:47 AM        Recent Labs: 05/18/2022: TSH 1.640 09/12/2022: Magnesium 2.2 10/17/2022: ALT 21; BUN 12; Creatinine, Ser 1.13; Hemoglobin  10.3; Platelets 356; Potassium 3.6; Sodium 135   Lipid Panel    Component Value Date/Time   CHOL 150 02/04/2021 0431   CHOL 139 10/23/2018 0947   CHOL 140 07/31/2012 1019   TRIG 45 02/04/2021 0431   TRIG 115 06/07/2015 1111   TRIG 161 (H) 07/31/2012 1019   HDL 72 02/04/2021 0431   HDL 53 10/23/2018 0947   HDL 68 06/07/2015 1111   HDL 43 07/31/2012 1019   CHOLHDL 2.1 02/04/2021 0431   VLDL 9 02/04/2021 0431   LDLCALC 69 02/04/2021 0431   LDLCALC 65 10/23/2018 0947   LDLCALC 65 09/09/2013 0841   LDLCALC 65 07/31/2012 1019     ASSESSMENT:    1. Coronary artery disease involving native coronary artery of native heart with angina pectoris (HCC)   2. Chronic ischemic heart disease   3. Paroxysmal atrial fibrillation (HCC)   4. Chronic sinus bradycardia   5. Bradycardia, drug induced   6.  Encounter for monitoring amiodarone therapy   7. Catecholaminergic polymorphic ventricular tachycardia (HCC)   8. Acquired thrombophilia (HCC)   9. Stage 3a chronic kidney disease (HCC)   10. ICD (implantable cardioverter-defibrillator) in place       PLAN:  In order of problems listed above:  CAD/stable angina: She has not complained of any chest discomfort in quite a while.  Symptoms are well-controlled on combination of beta-blocker and long-acting nitrates and did not worsen after amlodipine was discontinued (medication doses are limted by bradycardia and previous orthostatic hypotension issues).  She does not want to undergo cardiac catheterization or any other invasive procedures.  Consequently, it does not make any sense to perform noninvasive study such as nuclear stress tests.  She has had 3 separate stent procedures in the past, but the most recent one was in 2003.  She has undergone cardiac catheterization for chest pain with findings of patent coronary arteries in 2006, 2013 and 2015.  She is not the best candidate for invasive angiography in view of advanced age, diabetes mellitus, abnormal kidney function and need for anticoagulation, all of which place her at increased risk for complications.  We have had to decrease her beta-blocker dose due to bradycardia.  She becomes symptomatic when she has ventricular pacing (device programmed to 45 bpm). AFib: It does not appear that she has had any significant episodes of sustained atrial fibrillation in the last several months, based on the heart rate histogram distribution, although she does report recording her heart rate in the 140 range at 1 point.   It has been difficult to detect atrial fibrillation since she has a single chamber ICD, but we have been referred there presents due to high ventricular rate peaks recorded on her heart rate histograms in the past.  On amiodarone and on appropriate anticoagulation with Eliquis. CHADSVasc 6 (age  75, gender, DM, CAD, hypertension).  She does not want to undergo an upgrade to a dual-chamber device or any other invasive procedures. Bradycardia: On amiodarone and a very low-dose of metoprolol succinate. Avoid excessive bradycardia due to unpleasant palpitations when she has ventricular pacing.  This was again an evident complaint today when we did RV lead threshold testing Amiodarone: So far tolerating well.  Normal liver function tests 10/17/2022.  Normal TSH 05/18/2022 VT: This has been entirely abolished since amiodarone was initiated.  She has not had any therapy from her defibrillator since the early 1990s.  She did have a rather lengthy 25 beat nonsustained VT run in September  2020 when we decreased her beta-blocker dose due to bradycardia.  No significant high ventricular rates for several years now. Eliquis: Dose based on weight and age. CKD 3: Most recent creatinine better than previously estimated at 1.13, usual baseline creatinine 1.2-1.4.  Seeing Dr. Wolfgang Phoenix. HTN/Orthostatic hypotension: She has had 2 episodes of presyncope presumably due to orthostatic hypotension, but none recently.  She had a recent fall with serious injury, although this was not clearly preceded by hypotension..  I recommend that we tolerate SBP up to the high 150s, to reduce the risk of falls. ICD: Normal function of her single lead/single-chamber device.  The device sticks out very prominently due to her weight loss, but the site still looks very healthy.  Asked her to promptly report any sign of skin adherence to the underlying device or any other signs of compromise.   HLP: All lipid parameters are in target range. Aortic atherosclerosis: Has a chronic focal dissection of the abdominal aorta.  This is asymptomatic.  Not a candidate for repair and declines invasive procedures.   Cognitive decline: Stable. Short-term memory continues to be a problem, but without any significant deterioration. Her family member, [name] is with her and helps out.  She has a Agricultural engineer during the day.  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 DM: controlled without meds.   Discussions about end-of -life care:  I have had a discussion with Faylynn and her family member [name] about the fact that the defibrillator therapies may become undesirable if her physical and cognitive decline continues.  At some point, she should have a discussion with her family about long-term wishes.  She has already filled out DNR papers.  Reiterated the fact that if her quality of life deteriorates to a point that VT/VF therapies are not warranted, we can turn those therapies off, without compromising the monitoring or pacing features of her device.   Medication Adjustments/Labs and Tests Ordered: Current medicines are reviewed at length with the patient today.  Concerns regarding medicines are outlined above.  Medication changes, Labs and Tests ordered today are listed in the Patient Instructions below. Patient Instructions  Medication Instructions:  No changes *If you need a refill on your cardiac medications before your next appointment, please call your pharmacy*  Follow-Up: At [facility], you and your health needs are our priority.  As part of our continuing mission to provide you with exceptional heart care, we have created designated Provider Care Teams.  These Care Teams include your primary Cardiologist (physician) and Advanced Practice Providers (APPs -  Physician Assistants and Nurse Practitioners) who all work together to provide you with the care you need, when you need it.  We recommend signing up for the patient portal called "MyChart".  Sign up information is provided on this After Visit Summary.  MyChart is used to connect with patients for Virtual Visits  (Telemedicine).  Patients are able to view lab/test results, encounter notes, upcoming appointments, etc.  Non-urgent messages can be sent to your provider as well.   To learn more about what you can do with MyChart, go to [email or web address].    Your next appointment:   6 month(s)  Provider:   Thurmon Fair, MD       Signed, Thurmon Fair, MD  11/21/2022 6:03 PM    [facility] [address] [place], [place], [place]  [address] Phone: [phone]; Fax: [phone]

## 2022-11-30 ENCOUNTER — Ambulatory Visit (INDEPENDENT_AMBULATORY_CARE_PROVIDER_SITE_OTHER): Payer: Medicare (Managed Care)

## 2022-11-30 ENCOUNTER — Ambulatory Visit: Payer: Medicare (Managed Care) | Admitting: Gastroenterology

## 2022-11-30 ENCOUNTER — Encounter: Payer: Self-pay | Admitting: Gastroenterology

## 2022-11-30 VITALS — BP 139/65 | HR 51 | Temp 97.8°F | Ht 63.0 in | Wt 117.6 lb

## 2022-11-30 DIAGNOSIS — R140 Abdominal distension (gaseous): Secondary | ICD-10-CM

## 2022-11-30 DIAGNOSIS — R630 Anorexia: Secondary | ICD-10-CM | POA: Diagnosis not present

## 2022-11-30 DIAGNOSIS — R109 Unspecified abdominal pain: Secondary | ICD-10-CM

## 2022-11-30 DIAGNOSIS — I259 Chronic ischemic heart disease, unspecified: Secondary | ICD-10-CM | POA: Diagnosis not present

## 2022-11-30 DIAGNOSIS — K219 Gastro-esophageal reflux disease without esophagitis: Secondary | ICD-10-CM | POA: Diagnosis not present

## 2022-11-30 DIAGNOSIS — R634 Abnormal weight loss: Secondary | ICD-10-CM | POA: Diagnosis not present

## 2022-11-30 MED ORDER — DOCUSATE SODIUM 100 MG PO CAPS: 100.0000 mg | ORAL_CAPSULE | Freq: Every day | ORAL | 1 refills | Status: DC

## 2022-11-30 MED ORDER — PANTOPRAZOLE SODIUM 40 MG PO TBEC: 40.0000 mg | DELAYED_RELEASE_TABLET | Freq: Two times a day (BID) | ORAL | 2 refills | Status: DC

## 2022-11-30 NOTE — Patient Instructions (Addendum)
Continue to drink your Ensure twice daily.  I want you to start back taking your stool softener (Colace) once daily in the mornings.  Continue the pantoprazole 40 mg twice daily as well as the simethicone as needed.  Continue Tylenol as needed for your abdominal pain as well.  Given you have been taking this 1-2 times a day you can take this up to 3 times a day.  Since you take this in the morning and in the evenings before bed you can take between lunch and dinner if you would like.  We will follow-up in 3 months, sooner if needed.  I hope you have a wonderful Thanksgiving and Christmas!  It was a pleasure to see you today. I want to create trusting relationships with patients. If you receive a survey regarding your visit,  I greatly appreciate you taking time to fill this out on paper or through your MyChart. I value your feedback.  Brooke Bonito, MSN, FNP-BC, AGACNP-BC [facility]

## 2022-11-30 NOTE — Progress Notes (Signed)
GI Office Note    Referring Provider: Bayard Males, NP Primary Care Physician:  Bayard Males, NP (Inactive) Primary Gastroenterologist: Hennie Duos. Marletta Lor, DO  Date:  11/30/2022  ID:  [name], DOB August 17, 1931, MRN [id number]   Chief Complaint   Chief Complaint  Patient presents with   Abdominal Pain    Abdominal pain in the middle. Feels sick to her stomach.    History of Present Illness  [name] is a 89 y.o. female with a history of GERD, C. difficile in January 2022, microscopic colitis in 07, 09, 2012, and chronic abdominal pain with evaluation as outlined below.  She has had extensive evaluation for her abdominal pain: EGD in November 2021 with Schatzki's ring not manipulated CTA November 2021 without evidence of chronic mesenteric ischemia Ultrasound without gallstones in March 2022 HIDA scan March 2022 also unrevealing Colonoscopy in March 2022 with diverticulosis in sigmoid and descending colon with 6 mm polyp in the sigmoid colon not removed given she was on Eliquis Repeat CTA March 2022 without evidence of large vessel disease Unable to tolerate BuSpar due to hallucinations.  Not a candidate for TCAs given history of prolonged Qtc Suspected microvascular angina versus bowel hypersensitivity All prior imaging have noted pancreatic lesion as far back as 2020 thought to be a cyst versus IPMN versus fat without any dedicated pancreatic imaging   Office visit 04/07/2021 -she was seen for diarrhea and continued postprandial abdominal pain.  Her weight has been fluctuating.  She was trying to drink to boost shakes a day.  She reported that her abdominal pain was somewhat improved after having a bowel movement.  She was continuing to have watery stools 3-4 times a day with a couple of nocturnal stools.  She was taking Pepto-Bismol as needed.  Denying any BRBPR or melena.   In the interim she had been evaluated in the ED in early April 2023 for chest  pain, hypertension, and bradycardia.  She reported BP elevated to systolic over 200 at home with chest pain.  She took 2 nitroglycerin tablets which helped relieve her chest pain.  She also noted her heart rate to be 40.  Upon monitoring in the ED her heart rate improved to 60, BP was stabilized with no more chest pain.  She was recommended follow-up with her PCP and cardiology as needed   She was seen by her PCP on 05/11/2021 with complaint of rectal bleeding for 6 days.  Was wearing an incontinence pad and had bleeding on the pad.  Her sister had reported blood on her pajamas.  Also has noted some blood on toilet tissue with urination and defecation.  Also noted to be dizzy and lightheaded.  She underwent rectal exam without noted fissure or hemorrhoid.  Also FOBT negative in the office.  POCT hemoglobin was 11.3.(Of note the dizziness and weakness was reported to be baseline for the patient).  Pelvic exam without lesions or bleeding, UA positive for RBCs and a few bacteria.  She was given Keflex.   Last office visit 05/19/21. Reported rectal bleeding, abdominal pain, diarrhea. Baseline weakness. Mild rectal discomfort. Aide reported normal colored stools and has had chornic diarrhea. No N/V, weight loss. Follows with cardiology and nephrology. Advised CBC in 1 week, use hemorrhoid cream as needed for rectal discomfort.  Advised to consider colonoscopy if rectal bleeding continues to worsen or significant anemia found on lab work.   Hgb 11 on 05/24/21.    Patient was  hospitalized in November 17, 2021 with symptoms associated with COVID and presented with symptoms concerning for stroke.  Her stroke workup was negative and her mental status returned to baseline quickly.  She was noted to be chronically in A-fib with amiodarone, metoprolol, Eliquis for anticoagulation.   CT A/P without contrast 12/05/2021: -Acute or active cardiopulmonary disease -Cholelithiasis -Small hiatal hernia -Compression fracture  deformities level of T12 and L1 -Stable from millimeter subpleural posterior right basilar lung nodule   Last lab work 12/12/2021.  Hemoglobin of 10.3   Patient called PCP on 04/24/2022 with reports of heart rate in the 40s/50s.  She also reported persistent postprandial abdominal pain but unable to fully describe the pain does not completely go away.  She is advised to eat small frequent meals, stay upright for few hours after eating meals, avoid caffeine and other irritants continue Protonix once daily and add simethicone.  At office visit in April 2024 she was recommended to do Ensure or boost 1/day, increase protein in her diet and eat 4-6 small meals a day and continue PPI twice daily.  Given it had been a while since she had any imaging was advised we could repeat a CTA of the abdomen and pelvis.  This was not performed.  In June I had spoken with provider at [facility] as well as patient and they agree to change her from boost to a protein powder supplement daily and instead of performing CTA it was decided for her to have ultrasound for mesenteric arteries instead.   Ultrasound of mesenteric arteries in July 2024 without any significant mesenteric arterial stenosis. ACTH stimulation test within normal limits  Last office visit 08/30/2022 with Dr. Marletta Lor.  Recommended mended more aggressive Tylenol use since is only thing that helps.  Advised her she can take this 1-2 times a day and continue simethicone as needed for bloating.  She was recommended to drink 2 protein shakes daily and continue pantoprazole.   ED visit 10/17/2022 with right flank pain.  Underwent CT as noted below.  UA with concern for infection.  CMP with creatinine 1.13, alk phos 131, GFR 47, otherwise unremarkable.  Lipase within normal limits.,  Hemoglobin stable at 10.3.  WBC within normal limits.  Discharged with course of Keflex for possible UTI.   CT abdomen pelvis without contrast 10/17/2022 for abdominal pain: - Bladder gas,  correlate for UTI. - Colonic fluid levels reaching the rectum suggesting diarrheal illness. - Urolithiasis and medullary nephrocalcinosis. Asymmetric left renal atrophy. - Dense liver, correlate for long-term amiodarone use or risk factors of hemo siderosis.    Today: Drinking 2 protein shakes per day (Ensure clear) does not like th milky kind).   Bowel movents are a couple a day. They are normal BMs. Sometimes it is hard to go and has to strain. No blood in her stool. Has been on colace in the past in August but since rehab she has not been using them.   No vomiting. Does have some nausea - typically some times after she eats. Eating smaller meals. Usually does have a big breakfast. Sometimes has to force herself to eat, especially at night. Appetite lacks in the evening.   Drinks shakes in the morning and midday.   Pain is located mid abdomen mostly and feels like a cramp. Once it comes it kinda stays. When she lays flat down on her back it does not bother her much.    Got out of rehab within the last month. Larey Seat on  August 23, had surgery 8/24 and stays at [facility] for a week and then went to rehab. Came home October 8th. Sits in recliner at home and walks with a walker at home (2 wheel walker).   Wt Readings from Last 3 Encounters:  11/30/22 117 lb 9.6 oz (53.3 kg)  11/21/22 115 lb (52.2 kg)  10/17/22 115 lb (52.2 kg)  08/30/22 115 lb  Current Outpatient Medications  Medication Sig Dispense Refill   acetaminophen (TYLENOL) 500 MG tablet Take 1 tablet (500 mg total) by mouth every 6 (six) hours as needed for mild pain, fever or headache. 30 tablet 0   Alpha-Lipoic Acid 600 MG TABS Take 600 mg by mouth daily. For neuropathy in legs (Patient taking differently: Take 100 mg by mouth daily.) 90 tablet 3   amiodarone (PACERONE) 200 MG tablet Take 1 tablet (200 mg total) by mouth daily. 90 tablet 3   aspirin EC 81 MG EC tablet Take 1 tablet (81 mg total) by mouth daily. Swallow whole. 30 tablet 11    carboxymethylcellulose (REFRESH PLUS) 0.5 % SOLN Place 1 drop into both eyes 3 (three) times daily as needed (dry/irritated eyes.).     ELIQUIS 2.5 MG TABS tablet TAKE (1) TABLET TWICE A DAY. (Patient taking differently: Take 2.5 mg by mouth 2 (two) times daily.) 180 tablet 1   feeding supplement (ENSURE ENLIVE / ENSURE PLUS) LIQD Take 237 mLs by mouth 2 (two) times daily between meals. 237 mL 12   gabapentin (NEURONTIN) 100 MG capsule Take 1 capsule (100 mg total) by mouth at bedtime. 30 capsule 0   latanoprost (XALATAN) 0.005 % ophthalmic solution Place 1 drop into the left eye at bedtime.     levothyroxine (SYNTHROID) 112 MCG tablet Take 112 mcg by mouth daily.     MELATONIN PO Take 10 mg by mouth at bedtime.     metoprolol succinate (TOPROL-XL) 25 MG 24 hr tablet Take 0.5 tablets (12.5 mg total) by mouth daily. Take with or immediately following a meal. (Patient taking differently: Take 12.5 mg by mouth 2 (two) times daily. Take with or immediately following a meal.) 15 tablet 11   Multiple Vitamins-Minerals (CENTRUM SILVER ULTRA WOMENS PO) Take 1 tablet by mouth every morning.     nitroGLYCERIN (NITROSTAT) 0.4 MG SL tablet PLACE 1 TABLET UNDER THE TONGUE AT ONSET OF CHEST PAIN EVERY 5 MINTUES UP TO 3 TIMES AS NEEDED 25 tablet 2   pantoprazole (PROTONIX) 40 MG tablet Take 1 tablet (40 mg total) by mouth 2 (two) times daily before a meal. 90 tablet 0   polyethylene glycol (MIRALAX / GLYCOLAX) 17 g packet Take 17 g by mouth daily as needed for mild constipation. 14 each 0   rosuvastatin (CRESTOR) 10 MG tablet TAKE 1 TABLET DAILY 90 tablet 0   simethicone (MYLICON) 125 MG chewable tablet Chew 125 mg by mouth every 6 (six) hours as needed for flatulence.     amLODipine (NORVASC) 5 MG tablet Take 5 mg by mouth daily.     docusate sodium (COLACE) 100 MG capsule Take 1 capsule (100 mg total) by mouth 2 (two) times daily. (Patient not taking: Reported on 11/30/2022) 10 capsule 0   isosorbide  mononitrate (IMDUR) 60 MG 24 hr tablet Take 1 tablet (60 mg total) by mouth daily. 30 tablet 11   No current facility-administered medications for this visit.    Past Medical History:  Diagnosis Date   AICD (automatic cardioverter/defibrillator) present    Allergy  SESONAL   Aneurysm of infrarenal abdominal aorta (HCC)    Anxiety    ARTHRITIS    Arthritis    Atrial fibrillation (HCC)    CAD (coronary artery disease)    a. history of cardiac arrest 1993;  b. s/p LAD/LCX stenting in 2003;  c. 07/2011 Cath: LM nl, LAD patent stent, D1 80ost, LCX patent stent, RCA min irregs;  d. 04/2012 MV: EF 66%, no ishcemia;  e. 12/2013 Echo: Ef 55%, no rwma, Gr 1 DD, triv AI/MR, mildly dil LA;  f. 12/2013 Lexi MV: intermediate risk - apical ischemia and inf/infsept fixed defect, ? artifactual.   CAD in native artery 12/31/2013   Previous stents to LAD and Ramus patent on cath today 12/31/13 also There is severe disease in the ostial first diagonal which is unchanged from most recent cardiac catheterization. The right coronary artery could not be engaged selectively but nonselective angiography showed no significant disease in the proximal and midsegment.       Cataract    DENIES   Chronic back pain    Chronic sinus bradycardia    CKD (chronic kidney disease), stage III (HCC)    Clotting disorder (HCC)    DVT   COLITIS 12/02/2007   Qualifier: Diagnosis of  By: Koleen Distance CMA Duncan Dull), Hulan Saas     Diabetes mellitus without complication (HCC)    DENIES   Diverticulosis of colon (without mention of hemorrhage) 2007   Colonoscopy    Esophageal stricture    a. 2012 s/p dil.   Esophagitis, unspecified    a. 2012 EGD   EXTERNAL HEMORRHOIDS    GERD (gastroesophageal reflux disease)    omeprazole   H/O hiatal hernia    Hiatal hernia    a. 2012 EGD.   History of DVT in the past, not on Coumadin now    left  leg   HYPERCHOLESTEROLEMIA    Hypertension    ICD (implantable cardiac defibrillator) in place     a. s/p initial ICD in 1993 in setting of cardiac arrest;  b. 01/2007 gen change: Guidant T135 Vitality DS VR single lead ICD.   Macular degeneration    gets injecton in eye every 5 weeks- last injection - 05/03/2013    Myocardial infarction [facility]) 1993   Near syncope 05/22/2015   Nephrolithiasis, just saw Dr Annabell Howells- "OK"    Orthostatic hypotension    Peripheral vascular disease (HCC)    ???   RECTAL BLEEDING 12/03/2007   Qualifier: Diagnosis of  By: Jarold Motto MD Mervyn Skeeters R    Unspecified gastritis and gastroduodenitis without mention of hemorrhage    a. 2003 EGD->not noted on 2012 EGD.   Unstable angina, neg MI, cath stable.maybe GI 12/30/2013    Past Surgical History:  Procedure Laterality Date   BREAST BIOPSY Left 11/2013   CARDIAC CATHETERIZATION     CARDIAC CATHETERIZATION  01/01/15   patent stents -there is severe disease in ostial 1st diag but without change.    CARDIAC DEFIBRILLATOR PLACEMENT  02/05/2007   Guidant   CARDIOVASCULAR STRESS TEST  12/30/2013   abnormal   CARDIOVERSION N/A 02/13/2019   Procedure: CARDIOVERSION;  Surgeon: Thurmon Fair, MD;  Location: [facility] ENDOS[facility];  Service: Cardiovascular;  Laterality: N/A;   CATARACT EXTRACTION Right    Dr. Dione Booze   COLONOSCOPY     COLONOSCOPY WITH PROPOFOL N/A 03/30/2020   Procedure: COLONOSCOPY WITH PROPOFOL;  Surgeon: Dolores Frame, MD;  Location: [facility];  Service: Gastroenterology;  Laterality: N/A;  coronary stents      CYSTOSCOPY WITH URETEROSCOPY Right 05/20/2013   Procedure: CYSTOSCOPY, RIGHT URETEROSCOPY STONE EXTRACTION, Insertion of right DOUBLE J STENT ;  Surgeon: Bjorn Pippin, MD;  Location: [facility] ORS;  Service: Urology;  Laterality: Right;   CYSTOSCOPY WITH URETEROSCOPY AND STENT PLACEMENT N/A 06/03/2013   Procedure: SECOND LOOK CYSTOSCOPY WITH URETEROSCOPY  HOLMIUM LASER LITHO AND STONE EXTRACTION Newman Nip ;  Surgeon: Anner Crete, MD;  Location: [facility];  Service: Urology;  Laterality: N/A;    ESOPHAGOGASTRODUODENOSCOPY (EGD) WITH PROPOFOL N/A 11/20/2019   Procedure: ESOPHAGOGASTRODUODENOSCOPY (EGD) WITH PROPOFOL;  Surgeon: Corbin Ade, MD;  Location: [facility];  Service: Endoscopy;  Laterality: N/A;   GIVENS CAPSULE STUDY N/A 10/30/2016   Procedure: GIVENS CAPSULE STUDY;  Surgeon: Hilarie Fredrickson, MD;  Location: [facility] ENDOSCOPY;  Service: Endoscopy;  Laterality: N/A;  NEEDS TO BE ADMITTED FOR OBSERVATION PT. HAS DEFIB   HEMORROIDECTOMY  80's   HOLMIUM LASER APPLICATION Right 05/20/2013   Procedure: HOLMIUM LASER APPLICATION;  Surgeon: Bjorn Pippin, MD;  Location: [facility];  Service: Urology;  Laterality: Right;   HYSTEROSCOPY WITH D & C  09/02/2010   Procedure: DILATATION AND CURETTAGE (D&C) /HYSTEROSCOPY;  Surgeon: Meriel Pica;  Location: [facility] OR[facility];  Service: Gynecology;  Laterality: N/A;  Dilation and Curettage with Hysteroscopy and Polypectomy   IMPLANTABLE CARDIOVERTER DEFIBRILLATOR (ICD) GENERATOR CHANGE N/A 02/10/2014   Procedure: ICD GENERATOR CHANGE;  Surgeon: Thurmon Fair, MD;  Location: [facility] CATH LAB;  Service: Cardiovascular;  Laterality: N/A;   INTRAMEDULLARY (IM) NAIL INTERTROCHANTERIC Left 09/09/2022   Procedure: INTRAMEDULLARY (IM) NAIL INTERTROCHANTERIC;  Surgeon: Durene Romans, MD;  Location: [facility];  Service: Orthopedics;  Laterality: Left;   LEFT HEART CATHETERIZATION WITH CORONARY ANGIOGRAM N/A 07/28/2011   Procedure: LEFT HEART CATHETERIZATION WITH CORONARY ANGIOGRAM;  Surgeon: Runell Gess, MD;  Location: [facility] CATH LAB;  Service: Cardiovascular;  Laterality: N/A;   LEFT HEART CATHETERIZATION WITH CORONARY ANGIOGRAM N/A 12/31/2013   Procedure: LEFT HEART CATHETERIZATION WITH CORONARY ANGIOGRAM;  Surgeon: Iran Ouch, MD;  Location: [facility] CATH LAB;  Service: Cardiovascular;  Laterality: N/A;    Family History  Problem Relation Age of Onset   Stroke Mother    Other Mother        brain tumor   Other Father        MI   Heart attack Father    Stroke Sister     Macular degeneration Sister    Diabetes Daughter    Cancer Daughter        ovarian   Colon polyps Daughter    Atrial fibrillation Sister    Hyperlipidemia Sister    Osteoporosis Sister    Stroke Sister    Uterine cancer Sister    Heart attack Brother    Heart disease Brother    Asthma Brother    Breast cancer Neg Hx     Allergies as of 11/30/2022 - Review Complete 11/21/2022  Allergen Reaction Noted   Sotalol Other (See Comments) 11/01/2016   Tramadol Other (See Comments) 12/23/2019   Actos [pioglitazone] Swelling 07/31/2012   Adhesive [tape] Other (See Comments) 02/20/2014   Atorvastatin Other (See Comments) 07/31/2012   Bactrim [sulfamethoxazole-trimethoprim] Itching and Rash 08/19/2016   Ciprofloxacin Nausea Only 02/05/2018   Contrast media [iodinated contrast media] Other (See Comments) 07/25/2011   Latex Rash 05/29/2013   Pedi-pre tape spray [wound dressing adhesive] Other (See Comments) 02/20/2014   Sulfa antibiotics Other (See Comments) 02/20/2014  Social History   Socioeconomic History   Marital status: Widowed    Spouse name: Not on file   Number of children: 4   Years of education: 8   Highest education level: 8th grade  Occupational History   Occupation: Retired  Tobacco Use   Smoking status: Never   Smokeless tobacco: Never  Vaping Use   Vaping status: Never Used  Substance and Sexual Activity   Alcohol use: No   Drug use: No   Sexual activity: Never    Birth control/protection: Post-menopausal  Other Topics Concern   Not on file  Social History Narrative   Lives alone - son visits Sundays, daughter visits Saturday   Has an aide Marcha Solders) 5 days per week   Social Determinants of Health   Financial Resource Strain: Low Risk  (09/27/2020)   Overall Financial Resource Strain (CARDIA)    Difficulty of Paying Living Expenses: Not hard at all  Food Insecurity: No Food Insecurity (09/08/2022)   Hunger Vital Sign    Worried About Running  Out of Food in the Last Year: Never true    Ran Out of Food in the Last Year: Never true  Transportation Needs: No Transportation Needs (09/08/2022)   PRAPARE - Administrator, Civil Service (Medical): No    Lack of Transportation (Non-Medical): No  Physical Activity: Inactive (03/15/2021)   Exercise Vital Sign    Days of Exercise per Week: 0 days    Minutes of Exercise per Session: 0 min  Stress: Stress Concern Present (03/15/2021)   [employer] of Occupational Health - Occupational Stress Questionnaire    Feeling of Stress : To some extent  Social Connections: Moderately Isolated (09/27/2020)   Social Connection and Isolation Panel [NHANES]    Frequency of Communication with Friends and Family: More than three times a week    Frequency of Social Gatherings with Friends and Family: Twice a week    Attends Religious Services: 1 to 4 times per year    Active Member of [employer] or Organizations: No    Attends Banker Meetings: Never    Marital Status: Widowed   Review of Systems   Gen: Denies fever, chills, anorexia. Denies fatigue, weakness, weight loss.  CV: Denies chest pain, palpitations, syncope, peripheral edema, and claudication. Resp: Denies dyspnea at rest, cough, wheezing, coughing up blood, and pleurisy. GI: See HPI Derm: Denies rash, itching, dry skin Psych: Denies depression, anxiety, memory loss, confusion. No homicidal or suicidal ideation.  Heme: Denies bruising, bleeding, and enlarged lymph nodes.  Physical Exam   BP 139/65 (BP Location: Left Arm, Patient Position: Sitting, Cuff Size: Normal)   Pulse (!) 51   Temp 97.8 F (36.6 C) (Temporal)   Ht 5\' 3"  (1.6 m)   Wt 117 lb 9.6 oz (53.3 kg)   BMI 20.83 kg/m   General:   Alert and oriented. No distress noted. Pleasant and cooperative.  Head:  Normocephalic and atraumatic. Eyes:  Conjuctiva clear without scleral icterus. Mouth:  Oral mucosa pink and moist. Good dentition. No  lesions. Lungs:  Clear to auscultation bilaterally. No wheezes, rales, or rhonchi. No distress.  Heart:  S1, S2 present without murmurs appreciated.  Abdomen:  +BS, soft, non-tender and non-distended. No rebound or guarding. No HSM or masses noted. Rectal: deferred Msk:  Symmetrical without gross deformities. Normal posture. Extremities:  Without edema. Neurologic:  Alert and  oriented x4 Psych:  Alert and cooperative. Normal mood and affect.  Assessment  [name] is a 89 y.o. female with a history of GERD, C. difficile in January 2022, microscopic colitis in 07, 09, 2012, and chronic abdominal pain presented today for follow-up with ongoing abdominal pain.  Chronic abdominal pain, bloating: Extensive evaluation as outlined in HPI.  Her abdominal pain is complex and suggestive of microvascular angina given her cardiac history.  Unable to obtain CTA given her kidney function.  Recent mesenteric Dopplers reassuring.  Patient reiterates today that she would not want any invasive procedures performed and given Tylenol is helping some with her discomfort we will continue this as well as PPI BID I did advise her that if she wanted to increase her Tylenol from 2 times a day to 3 times a day that this would be okay.  She has noted some mild straining with bowel movements despite soft/looser stools therefore I have advised her to resume Colace once daily, we can increase to twice daily if needed.  She will also continue simethicone as needed.   Weight loss, lack of appetite: Weight stable, actually has gained 2 pound since her last visit.  Currently doing clear Ensure twice daily and advised her to continue this.  Again discussed eating more frequent smaller meals throughout the day.  Chronic GERD: Fairly well-controlled with pantoprazole BID. Refilled today.   PLAN   Continue pantoprazole 40 mg twice daily.  Continue ensure twice daily.  Start colace 100 mg once daily.  Continue simethicone  as needed Continue Tylenol 1-2 times daily, may take an additional third dose if needed. Follow up in 3 months    Brooke Bonito, MSN, FNP-BC, AGACNP-BC [facility]

## 2022-12-01 LAB — CUP PACEART REMOTE DEVICE CHECK
Battery Remaining Longevity: 96 mo
Battery Remaining Percentage: 87 %
Brady Statistic RV Percent Paced: 0 %
Date Time Interrogation Session: 20241114082700
HighPow Impedance: 35 Ohm
Implantable Lead Connection Status: 753985
Implantable Lead Implant Date: 20090120
Implantable Lead Location: 753860
Implantable Lead Model: 157
Implantable Lead Serial Number: 138237
Implantable Pulse Generator Implant Date: 20160126
Lead Channel Impedance Value: 458 Ohm
Lead Channel Pacing Threshold Amplitude: 1.2 V
Lead Channel Pacing Threshold Pulse Width: 0.5 ms
Lead Channel Setting Pacing Amplitude: 2.2 V
Lead Channel Setting Pacing Pulse Width: 0.5 ms
Lead Channel Setting Sensing Sensitivity: 0.6 mV
Pulse Gen Serial Number: 191956

## 2022-12-11 ENCOUNTER — Ambulatory Visit: Payer: Medicare (Managed Care) | Admitting: Podiatry

## 2022-12-18 ENCOUNTER — Ambulatory Visit: Payer: Medicare (Managed Care) | Admitting: Podiatry

## 2022-12-18 NOTE — Progress Notes (Signed)
Remote ICD transmission.   

## 2022-12-19 NOTE — Progress Notes (Signed)
[facility] - Clinic Note  12/25/2022     CHIEF COMPLAINT Patient presents for Retina Follow Up   HISTORY OF PRESENT ILLNESS: [name] is a 89 y.o. female who presents to the clinic today for:   HPI     Retina Follow Up   Patient presents with  Wet AMD.  In right eye.  This started 8 weeks ago.  I, the attending physician,  performed the HPI with the patient and updated documentation appropriately.        Comments   Patient here for 8 weeks (5 months) retina follow up for exu ARMD OD. Patient states missed last appointment. Larey Seat broke left hip. Eye eye pain. Eyes may not be as good.      Last edited by Rennis Chris, MD on 12/25/2022 11:22 AM.    Pt is delayed to follow up from 8 weeks to 5 months (07.02.24 to 12.09.24) due to falling and breaking her hip in August, she states she had sx and rehab afterwards, but feels like she is doing good now  Referring physician: Jethro Bastos, MD [address] [place],  [place] [address]  HISTORICAL INFORMATION:   Selected notes from the MEDICAL RECORD NUMBER [name] -- transferring care due to insurance LEE: 09.07.23 -- BCVA CF OU Ocular Hx- ex ARMD OD -- s/p multiple IVA, last IVA OD 09.07.24 (4+ mos) PMH-    CURRENT MEDICATIONS: Current Outpatient Medications (Ophthalmic Drugs)  Medication Sig   carboxymethylcellulose (REFRESH PLUS) 0.5 % SOLN Place 1 drop into both eyes 3 (three) times daily as needed (dry/irritated eyes.).   latanoprost (XALATAN) 0.005 % ophthalmic solution Place 1 drop into the left eye at bedtime.   No current facility-administered medications for this visit. (Ophthalmic Drugs)   Current Outpatient Medications (Other)  Medication Sig   acetaminophen (TYLENOL) 500 MG tablet Take 1 tablet (500 mg total) by mouth every 6 (six) hours as needed for mild pain, fever or headache.   Alpha-Lipoic Acid 600 MG TABS Take 600 mg by mouth daily. For neuropathy in legs (Patient taking  differently: Take 100 mg by mouth daily.)   amiodarone (PACERONE) 200 MG tablet Take 1 tablet (200 mg total) by mouth daily.   amLODipine (NORVASC) 5 MG tablet Take 5 mg by mouth daily.   aspirin EC 81 MG EC tablet Take 1 tablet (81 mg total) by mouth daily. Swallow whole.   docusate sodium (COLACE) 100 MG capsule Take 1 capsule (100 mg total) by mouth daily.   ELIQUIS 2.5 MG TABS tablet TAKE (1) TABLET TWICE A DAY. (Patient taking differently: Take 2.5 mg by mouth 2 (two) times daily.)   feeding supplement (ENSURE ENLIVE / ENSURE PLUS) LIQD Take 237 mLs by mouth 2 (two) times daily between meals.   gabapentin (NEURONTIN) 100 MG capsule Take 1 capsule (100 mg total) by mouth at bedtime.   isosorbide mononitrate (IMDUR) 60 MG 24 hr tablet Take 1 tablet (60 mg total) by mouth daily.   levothyroxine (SYNTHROID) 112 MCG tablet Take 112 mcg by mouth daily.   MELATONIN PO Take 10 mg by mouth at bedtime.   Multiple Vitamins-Minerals (CENTRUM SILVER ULTRA WOMENS PO) Take 1 tablet by mouth every morning.   nitroGLYCERIN (NITROSTAT) 0.4 MG SL tablet PLACE 1 TABLET UNDER THE TONGUE AT ONSET OF CHEST PAIN EVERY 5 MINTUES UP TO 3 TIMES AS NEEDED   pantoprazole (PROTONIX) 40 MG tablet Take 1 tablet (40 mg total) by  mouth 2 (two) times daily before a meal.   polyethylene glycol (MIRALAX / GLYCOLAX) 17 g packet Take 17 g by mouth daily as needed for mild constipation.   rosuvastatin (CRESTOR) 10 MG tablet TAKE 1 TABLET DAILY   simethicone (MYLICON) 125 MG chewable tablet Chew 125 mg by mouth every 6 (six) hours as needed for flatulence.   metoprolol succinate (TOPROL-XL) 25 MG 24 hr tablet Take 0.5 tablets (12.5 mg total) by mouth daily. Take with or immediately following a meal. (Patient taking differently: Take 12.5 mg by mouth 2 (two) times daily. Take with or immediately following a meal.)   No current facility-administered medications for this visit. (Other)   REVIEW OF SYSTEMS: ROS   Positive for:  Cardiovascular, Eyes Negative for: Constitutional, Gastrointestinal, Neurological, Skin, Genitourinary, Musculoskeletal, HENT, Endocrine, Respiratory, Psychiatric, Allergic/Imm, Heme/Lymph Last edited by Laddie Aquas, COA on 12/25/2022  9:58 AM.     ALLERGIES Allergies  Allergen Reactions   Sotalol Other (See Comments)    Torsades    Tramadol Other (See Comments)    Hallucination, bad-dream   Actos [Pioglitazone] Swelling   Adhesive [Tape] Other (See Comments)    Causes sores   Atorvastatin Other (See Comments)    myalgia   Bactrim [Sulfamethoxazole-Trimethoprim] Itching and Rash    Itchy rash   Ciprofloxacin Nausea Only   Contrast Media [Iodinated Contrast Media] Other (See Comments)    Headache (no action/pre-med required)   Latex Rash   Pedi-Pre Tape Spray [Wound Dressing Adhesive] Other (See Comments)    Causes sores   Sulfa Antibiotics Other (See Comments)    Causes sores   PAST MEDICAL HISTORY Past Medical History:  Diagnosis Date   AICD (automatic cardioverter/defibrillator) present    Allergy    SESONAL   Aneurysm of infrarenal abdominal aorta (HCC)    Anxiety    ARTHRITIS    Arthritis    Atrial fibrillation (HCC)    CAD (coronary artery disease)    a. history of cardiac arrest 1993;  b. s/p LAD/LCX stenting in 2003;  c. 07/2011 Cath: LM nl, LAD patent stent, D1 80ost, LCX patent stent, RCA min irregs;  d. 04/2012 MV: EF 66%, no ishcemia;  e. 12/2013 Echo: Ef 55%, no rwma, Gr 1 DD, triv AI/MR, mildly dil LA;  f. 12/2013 Lexi MV: intermediate risk - apical ischemia and inf/infsept fixed defect, ? artifactual.   CAD in native artery 12/31/2013   Previous stents to LAD and Ramus patent on cath today 12/31/13 also There is severe disease in the ostial first diagonal which is unchanged from most recent cardiac catheterization. The right coronary artery could not be engaged selectively but nonselective angiography showed no significant disease in the proximal and  midsegment.       Cataract    DENIES   Chronic back pain    Chronic sinus bradycardia    CKD (chronic kidney disease), stage III (HCC)    Clotting disorder (HCC)    DVT   COLITIS 12/02/2007   Qualifier: Diagnosis of  By: Koleen Distance CMA Duncan Dull), Hulan Saas     Diabetes mellitus without complication (HCC)    DENIES   Diverticulosis of colon (without mention of hemorrhage) 2007   Colonoscopy    Esophageal stricture    a. 2012 s/p dil.   Esophagitis, unspecified    a. 2012 EGD   EXTERNAL HEMORRHOIDS    GERD (gastroesophageal reflux disease)    omeprazole   H/O hiatal hernia    Hiatal hernia  a. 2012 EGD.   History of DVT in the past, not on Coumadin now    left  leg   HYPERCHOLESTEROLEMIA    Hypertension    ICD (implantable cardiac defibrillator) in place    a. s/p initial ICD in 1993 in setting of cardiac arrest;  b. 01/2007 gen change: Guidant T135 Vitality DS VR single lead ICD.   Macular degeneration    gets injecton in eye every 5 weeks- last injection - 05/03/2013    Myocardial infarction [facility]) 1993   Near syncope 05/22/2015   Nephrolithiasis, just saw Dr Annabell Howells- "OK"    Orthostatic hypotension    Peripheral vascular disease (HCC)    ???   RECTAL BLEEDING 12/03/2007   Qualifier: Diagnosis of  By: Jarold Motto MD Mervyn Skeeters R    Unspecified gastritis and gastroduodenitis without mention of hemorrhage    a. 2003 EGD->not noted on 2012 EGD.   Unstable angina, neg MI, cath stable.maybe GI 12/30/2013   Past Surgical History:  Procedure Laterality Date   BREAST BIOPSY Left 11/2013   CARDIAC CATHETERIZATION     CARDIAC CATHETERIZATION  01/01/15   patent stents -there is severe disease in ostial 1st diag but without change.    CARDIAC DEFIBRILLATOR PLACEMENT  02/05/2007   Guidant   CARDIOVASCULAR STRESS TEST  12/30/2013   abnormal   CARDIOVERSION N/A 02/13/2019   Procedure: CARDIOVERSION;  Surgeon: Thurmon Fair, MD;  Location: [facility] ENDOS[facility];  Service: Cardiovascular;  Laterality:  N/A;   CATARACT EXTRACTION Right    Dr. Dione Booze   COLONOSCOPY     COLONOSCOPY WITH PROPOFOL N/A 03/30/2020   Procedure: COLONOSCOPY WITH PROPOFOL;  Surgeon: Dolores Frame, MD;  Location: [facility];  Service: Gastroenterology;  Laterality: N/A;   coronary stents      CYSTOSCOPY WITH URETEROSCOPY Right 05/20/2013   Procedure: CYSTOSCOPY, RIGHT URETEROSCOPY STONE EXTRACTION, Insertion of right DOUBLE J STENT ;  Surgeon: Bjorn Pippin, MD;  Location: [facility] ORS;  Service: Urology;  Laterality: Right;   CYSTOSCOPY WITH URETEROSCOPY AND STENT PLACEMENT N/A 06/03/2013   Procedure: SECOND LOOK CYSTOSCOPY WITH URETEROSCOPY  HOLMIUM LASER LITHO AND STONE EXTRACTION Newman Nip ;  Surgeon: Anner Crete, MD;  Location: [facility];  Service: Urology;  Laterality: N/A;   ESOPHAGOGASTRODUODENOSCOPY (EGD) WITH PROPOFOL N/A 11/20/2019   Procedure: ESOPHAGOGASTRODUODENOSCOPY (EGD) WITH PROPOFOL;  Surgeon: Corbin Ade, MD;  Location: [facility];  Service: Endoscopy;  Laterality: N/A;   GIVENS CAPSULE STUDY N/A 10/30/2016   Procedure: GIVENS CAPSULE STUDY;  Surgeon: Hilarie Fredrickson, MD;  Location: [facility] ENDOSCOPY;  Service: Endoscopy;  Laterality: N/A;  NEEDS TO BE ADMITTED FOR OBSERVATION PT. HAS DEFIB   HEMORROIDECTOMY  80's   HOLMIUM LASER APPLICATION Right 05/20/2013   Procedure: HOLMIUM LASER APPLICATION;  Surgeon: Bjorn Pippin, MD;  Location: [facility];  Service: Urology;  Laterality: Right;   HYSTEROSCOPY WITH D & C  09/02/2010   Procedure: DILATATION AND CURETTAGE (D&C) /HYSTEROSCOPY;  Surgeon: Meriel Pica;  Location: [facility] ORS;  Service: Gynecology;  Laterality: N/A;  Dilation and Curettage with Hysteroscopy and Polypectomy   IMPLANTABLE CARDIOVERTER DEFIBRILLATOR (ICD) GENERATOR CHANGE N/A 02/10/2014   Procedure: ICD GENERATOR CHANGE;  Surgeon: Thurmon Fair, MD;  Location: [facility] CATH LAB;  Service: Cardiovascular;  Laterality: N/A;   INTRAMEDULLARY (IM) NAIL INTERTROCHANTERIC Left 09/09/2022   Procedure:  INTRAMEDULLARY (IM) NAIL INTERTROCHANTERIC;  Surgeon: Durene Romans, MD;  Location: [facility];  Service: Orthopedics;  Laterality: Left;   LEFT HEART CATHETERIZATION WITH  CORONARY ANGIOGRAM N/A 07/28/2011   Procedure: LEFT HEART CATHETERIZATION WITH CORONARY ANGIOGRAM;  Surgeon: Runell Gess, MD;  Location: [facility] CATH LAB;  Service: Cardiovascular;  Laterality: N/A;   LEFT HEART CATHETERIZATION WITH CORONARY ANGIOGRAM N/A 12/31/2013   Procedure: LEFT HEART CATHETERIZATION WITH CORONARY ANGIOGRAM;  Surgeon: Iran Ouch, MD;  Location: [facility] CATH LAB;  Service: Cardiovascular;  Laterality: N/A;   FAMILY HISTORY Family History  Problem Relation Age of Onset   Stroke Mother    Other Mother        brain tumor   Other Father        MI   Heart attack Father    Stroke Sister    Macular degeneration Sister    Diabetes Daughter    Cancer Daughter        ovarian   Colon polyps Daughter    Atrial fibrillation Sister    Hyperlipidemia Sister    Osteoporosis Sister    Stroke Sister    Uterine cancer Sister    Heart attack Brother    Heart disease Brother    Asthma Brother    Breast cancer Neg Hx    SOCIAL HISTORY Social History   Tobacco Use   Smoking status: Never   Smokeless tobacco: Never  Vaping Use   Vaping status: Never Used  Substance Use Topics   Alcohol use: No   Drug use: No       OPHTHALMIC EXAM:  Base Eye Exam     Visual Acuity (Snellen - Linear)       Right Left   Dist cc 20/150 +1 20/100 -2   Dist ph cc NI NI    Correction: Glasses         Tonometry (Tonopen, 9:55 AM)       Right Left   Pressure 15 13         Pupils       Dark Light Shape React APD   Right 3 2 Round Brisk None   Left 3 2 Round Brisk None         Visual Fields (Counting fingers)       Left Right    Full Full         Extraocular Movement       Right Left    Full, Ortho Full, Ortho         Neuro/Psych     Oriented x3: Yes   Mood/Affect: Normal          Dilation     Both eyes: 1.0% Mydriacyl, 2.5% Phenylephrine @ 9:55 AM           Slit Lamp and Fundus Exam     Slit Lamp Exam       Right Left   Lids/Lashes Dermatochalasis - upper lid Dermatochalasis - upper lid   Conjunctiva/Sclera White and quiet White and quiet   Cornea 1-2+ Punctate epithelial erosions, well healed cataract wound 2+ fine Punctate epithelial erosions, well healed cataract wound   Anterior Chamber deep and clear deep and clear   Iris Round and dilated Round and dilated   Lens PC IOL in good position PC IOL in good position   Anterior Vitreous mild syneresis, Posterior vitreous detachment, Vitreous condensations mild syneresis, silicone oil microbubbles, PVD         Fundus Exam       Right Left   Disc mild Pallor, Sharp rim, temporal PPA mild Pallor, Sharp rim, temporal PPA   C/D  Ratio 0.1 0.1   Macula Blunted foveal reflex, central GA with +CNV and IRH temporal to fovea, Drusen, RPE mottling and clumping Blunted foveal reflex, large area of GA centrally, Drusen, RPE mottling, clumping and atrophy, no heme   Vessels attenuated, Tortuous attenuated, mild tortuosity   Periphery Attached, reticular degeneration, No heme Attached, reticular degeneration, No heme           Refraction     Wearing Rx       Sphere Cylinder Axis Add   Right -1.00 +1.25 160 +2.25   Left -1.00 +0.75 150 +2.25           IMAGING AND PROCEDURES  Imaging and Procedures for 12/25/2022  OCT, Retina - OU - Both Eyes       Right Eye Quality was good. Central Foveal Thickness: 309. Progression has worsened. Findings include normal foveal contour, no IRF, no SRF, retinal drusen , subretinal hyper-reflective material, pigment epithelial detachment, outer retinal atrophy (Central ORA with scattered PED / [facility] -- PED slightly increased temporal mac, trace cystic changes temp fovea -- stably improved).   Left Eye Quality was good. Central Foveal Thickness: 207. Progression has  been stable. Findings include normal foveal contour, no IRF, no SRF, retinal drusen , subretinal hyper-reflective material, outer retinal atrophy (Central ORA with [facility]).   Notes *Images captured and stored on drive  Diagnosis / Impression:  OD: Exudative ARMD - Central ORA with scattered PED / [facility] -- PED slightly increased temporal mac, trace cystic changes temp fovea -- stably improved OS: Nonexudative ARMD - Central ORA with [facility]  Clinical management:  See below  Abbreviations: NFP - Normal foveal profile. CME - cystoid macular edema. PED - pigment epithelial detachment. IRF - intraretinal fluid. SRF - subretinal fluid. EZ - ellipsoid zone. ERM - epiretinal membrane. ORA - outer retinal atrophy. ORT - outer retinal tubulation. SRHM - subretinal hyper-reflective material. IRHM - intraretinal hyper-reflective material      Intravitreal Injection, Pharmacologic Agent - OD - Right Eye       Time Out 12/25/2022. 10:53 AM. Confirmed correct patient, procedure, site, and patient consented.   Anesthesia Topical anesthesia was used. Anesthetic medications included Lidocaine 2%, Proparacaine 0.5%.   Procedure Preparation included 5% betadine to ocular surface, eyelid speculum. A (32g) needle was used.   Injection: 1.25 mg Bevacizumab 1.25mg /0.23ml   Route: Intravitreal, Site: Right Eye   NDC: [id number], Lot: [id number], Expiration date: 01/27/2023   Post-op Post injection exam found visual acuity of at least counting fingers. The patient tolerated the procedure well. There were no complications. The patient received written and verbal post procedure care education. Post injection medications were not given.            ASSESSMENT/PLAN:    ICD-10-CM   1. Exudative age-related macular degeneration of right eye with active choroidal neovascularization (HCC)  H35.3211 OCT, Retina - OU - Both Eyes    Intravitreal Injection, Pharmacologic Agent - OD - Right Eye    Bevacizumab  (AVASTIN) SOLN 1.25 mg    2. Advanced atrophic nonexudative age-related macular degeneration of left eye with subfoveal involvement  H35.3124     3. Essential hypertension  I10     4. Hypertensive retinopathy of both eyes  H35.033     5. Pseudophakia, both eyes  Z96.1      Exudative age related macular degeneration, right eye - delayed follow up from 8 weeks to 5 months (July 18, 2022 to December 25, 2022) due to  hip fracture, sx and rehab - previously managed by Dr. Luciana Axe - last visit and injection was 09.07.24 (IVA OD) -- pt was on a q6-8 wk maintenance schedule -- presented at 4+ mos since last IVA OD w/ [facility] - s/p IVA OD #1 (01.30.24), #2 (03.12.24), #3 (05.07.24), #4 (07.02.24)  - BCVA OD decreased to 20/150 from 20/100   - OCT OD: central ORA with scattered PED / [facility] -- PED slightly increased temporal mac, trace cystic changes temp fovea -- stably improved at 5 mos  - exam shows increase in [facility] temporal macula - recommend IVA OD #5 today, 12.09.24  with follow up back to 4-6 wks  - pt wishes to proceed with injection  - RBA of procedure discussed, questions answered - IVA informed consent obtained and signed 01.30.24 - see procedure note  - f/u in 4-6 wks, DFE, OCT, possible injxn  2. Age related macular degeneration, non-exudative, left eye  - advanced stage with large central area of GA  - BCVA decreased to 20/100 from 20/80  - The incidence, anatomy, and pathology of dry AMD, risk of progression, and the AREDS and AREDS 2 study including smoking risks discussed with patient.  - Recommend amsler grid monitoring  3,4. Hypertensive retinopathy OU - discussed importance of tight BP control - continue to monitor  5. Pseudophakia OU  - s/p CE/IOL OU (Dr. Zetta Bills)  - IOLs in good position, doing well  - continue to monitor  Ophthalmic Meds Ordered this visit:  Meds ordered this encounter  Medications   Bevacizumab (AVASTIN) SOLN 1.25 mg     Return for f/u 4-6 weeks,  exu ARMD OD, DFE, OCT.  There are no Patient Instructions on file for this visit.   Explained the diagnoses, plan, and follow up with the patient and they expressed understanding.  Patient expressed understanding of the importance of proper follow up care.   This document serves as a record of services personally performed by Karie Chimera, MD, PhD. It was created on their behalf by Glee Arvin. Manson Passey, OA an ophthalmic technician. The creation of this record is the provider's dictation and/or activities during the visit.    Electronically signed by: Glee Arvin. Manson Passey, OA 12/25/22 11:23 AM  Karie Chimera, M.D., Ph.D. Diseases & Surgery of the Retina and Vitreous [facility]  I have reviewed the above documentation for accuracy and completeness, and I agree with the above. Karie Chimera, M.D., Ph.D. 12/25/22 11:25 AM   Abbreviations: M myopia (nearsighted); A astigmatism; H hyperopia (farsighted); P presbyopia; Mrx spectacle prescription;  CTL contact lenses; OD right eye; OS left eye; OU both eyes  XT exotropia; ET esotropia; PEK punctate epithelial keratitis; PEE punctate epithelial erosions; DES dry eye syndrome; MGD meibomian gland dysfunction; ATs artificial tears; PFAT's preservative free artificial tears; NSC nuclear sclerotic cataract; PSC posterior subcapsular cataract; ERM epi-retinal membrane; PVD posterior vitreous detachment; RD retinal detachment; DM diabetes mellitus; DR diabetic retinopathy; NPDR non-proliferative diabetic retinopathy; PDR proliferative diabetic retinopathy; CSME clinically significant macular edema; DME diabetic macular edema; dbh dot blot hemorrhages; CWS cotton wool spot; POAG primary open angle glaucoma; C/D cup-to-disc ratio; HVF humphrey visual field; GVF goldmann visual field; OCT optical coherence tomography; IOP intraocular pressure; BRVO Branch retinal vein occlusion; CRVO central retinal vein occlusion; CRAO central retinal artery  occlusion; BRAO branch retinal artery occlusion; RT retinal tear; SB scleral buckle; PPV pars plana vitrectomy; VH Vitreous hemorrhage; PRP panretinal laser photocoagulation; IVK intravitreal  kenalog; VMT vitreomacular traction; MH Macular hole;  NVD neovascularization of the disc; NVE neovascularization elsewhere; AREDS age related eye disease study; ARMD age related macular degeneration; POAG primary open angle glaucoma; EBMD epithelial/anterior basement membrane dystrophy; ACIOL anterior chamber intraocular lens; IOL intraocular lens; PCIOL posterior chamber intraocular lens; Phaco/IOL phacoemulsification with intraocular lens placement; PRK photorefractive keratectomy; LASIK laser assisted in situ keratomileusis; HTN hypertension; DM diabetes mellitus; COPD chronic obstructive pulmonary disease

## 2022-12-25 ENCOUNTER — Encounter (INDEPENDENT_AMBULATORY_CARE_PROVIDER_SITE_OTHER): Payer: Self-pay | Admitting: Ophthalmology

## 2022-12-25 ENCOUNTER — Ambulatory Visit (INDEPENDENT_AMBULATORY_CARE_PROVIDER_SITE_OTHER): Payer: Medicare (Managed Care) | Admitting: Ophthalmology

## 2022-12-25 DIAGNOSIS — I10 Essential (primary) hypertension: Secondary | ICD-10-CM

## 2022-12-25 DIAGNOSIS — H353124 Nonexudative age-related macular degeneration, left eye, advanced atrophic with subfoveal involvement: Secondary | ICD-10-CM

## 2022-12-25 DIAGNOSIS — H353211 Exudative age-related macular degeneration, right eye, with active choroidal neovascularization: Secondary | ICD-10-CM

## 2022-12-25 DIAGNOSIS — H35033 Hypertensive retinopathy, bilateral: Secondary | ICD-10-CM | POA: Diagnosis not present

## 2022-12-25 DIAGNOSIS — Z961 Presence of intraocular lens: Secondary | ICD-10-CM

## 2022-12-25 MED ORDER — BEVACIZUMAB CHEMO INJECTION 1.25MG/0.05ML SYRINGE FOR KALEIDOSCOPE
1.2500 mg | INTRAVITREAL | Status: AC | PRN
Start: 2022-12-25 — End: 2022-12-25
  Administered 2022-12-25: 1.25 mg via INTRAVITREAL

## 2022-12-29 ENCOUNTER — Encounter: Payer: Self-pay | Admitting: Podiatry

## 2022-12-29 ENCOUNTER — Ambulatory Visit (INDEPENDENT_AMBULATORY_CARE_PROVIDER_SITE_OTHER): Payer: Medicare (Managed Care) | Admitting: Podiatry

## 2022-12-29 DIAGNOSIS — M79675 Pain in left toe(s): Secondary | ICD-10-CM | POA: Diagnosis not present

## 2022-12-29 DIAGNOSIS — M79674 Pain in right toe(s): Secondary | ICD-10-CM

## 2022-12-29 DIAGNOSIS — B351 Tinea unguium: Secondary | ICD-10-CM | POA: Diagnosis not present

## 2022-12-29 DIAGNOSIS — L84 Corns and callosities: Secondary | ICD-10-CM

## 2022-12-29 DIAGNOSIS — Z7901 Long term (current) use of anticoagulants: Secondary | ICD-10-CM

## 2022-12-29 NOTE — Progress Notes (Unsigned)
Subjective: Chief Complaint  Patient presents with   Callouses    RM#11painful callus L hallux toe    89 year old female presents the office with above concerns.  She states that the callus on the tip of the left toe hurts quite a bit.  Denies any open lesions or any drainage.  The nails are thick elongated she cannot trim them herself and are causing discomfort.  No open lesions otherwise.  She has no other concerns today.  On Eliquis  Objective: AAO x3, NAD DP/PT pulses palpable bilaterally, CRT less than 3 seconds Hyperkeratotic lesion distal aspect left hallux with dried blood and upon debridement there is no ulceration noted but the area is preulcerative.  There is no drainage or pus.  There is no fluctuation, crepitation, malodor. Nails are hypertrophic, dystrophic, brittle, discolored, elongated 10. No surrounding redness or drainage. Tenderness nails 1-5 bilaterally.  No pain with calf compression, swelling, warmth, erythema  Assessment: Preulcerative callus due to digital deformity left hallux; symptomatic onychomycosis  Plan: -All treatment options discussed with the patient including all alternatives, risks, complications.  -Sharply debrided the hyperkeratotic lesion with any complications or bleeding.  Continue offloading.   -Sharply debrided nails x 10 without any complications or bleeding.    Return in about 3 months (around 03/29/2023).  Vivi Barrack DPM

## 2023-01-15 ENCOUNTER — Telehealth: Payer: Self-pay | Admitting: Cardiovascular Disease

## 2023-01-15 NOTE — Telephone Encounter (Signed)
  STAT if patient feels like he/she is going to faint   1. Are you feeling dizzy, lightheaded, or faint right now? Dizzy spells, feeling like she is going to faint.    2. Have you passed out?  No  (If yes move to .SYNCOPECHMG)   3. Do you have any other symptoms?    4. Have you checked your HR and BP (record if available)?  12/23 165/80 56 12/26 150/74 55 12/27 164/77 58 12/29 154/74 55 12/30  156/74 53   The patient stated that she has been experiencing dizzy spells and sometimes feels like she is going to faint. She mentioned that she has never had a syncopal episode. These symptoms started two weeks ago, and the spells are becoming more frequent. She added that she mostly feels this sensation at night when she lies down to sleep. Sometimes she will feel pain around where he ICD is

## 2023-01-15 NOTE — Telephone Encounter (Signed)
Called patient and left VM to call office any symptoms return. Advised patient if symptoms return and are worse to go to Emergency Room.

## 2023-01-15 NOTE — Telephone Encounter (Signed)
Daughter Erskine Squibb) returned RN's call and noted she updated patient's phone number in My Chart to show patient's telephone number is [phone].

## 2023-01-15 NOTE — Telephone Encounter (Signed)
Patient identification verified by 2 forms. Renee Rail, RN    Called and spoke to patient  Patient states:   -has episodes when she feels like she is going to pass out  -episodes occur at random   -started to worsen 1 week ago   -was advised to only take BP medication if SBP>170   -Metoprolol 12.5mg  daily   -checks BP first thing in the morning, this morning it was 156/74, HR 53  -BP at time of call: 149/72 Hr: 53  -does not have any BP readings after taking medications  Patient denies:   -Palpitations   -SOB/difficulty breathing  -LOC/falls Advised patient:   -change position slowly   -stay hydrated Reviewed ED warning signs/precautions  Patient verbalized understanding, no questions at this time

## 2023-01-16 NOTE — Progress Notes (Signed)
 [facility] - Clinic Note  01/30/2023     CHIEF COMPLAINT Patient presents for Retina Follow Up   HISTORY OF PRESENT ILLNESS: [name] is a 89 y.o. female who presents to the clinic today for:   HPI     Retina Follow Up   Patient presents with  Wet AMD.  In right eye.  This started 5 weeks ago.  Duration of 5 weeks.  Since onset it is gradually worsening.  I, the attending physician,  performed the HPI with the patient and updated documentation appropriately.        Comments   5 week retina follow up AMD OD and IVA OD pt is reporting vision changes little worse she denies any flashes or floaters       Last edited by Valdemar Rogue, MD on 01/30/2023  4:52 PM.     Referring physician: No referring provider defined for this encounter.  HISTORICAL INFORMATION:   Selected notes from the MEDICAL RECORD NUMBER [name] -- transferring care due to insurance LEE: 09.07.23 -- BCVA CF OU Ocular Hx- ex ARMD OD -- s/p multiple IVA, last IVA OD 09.07.24 (4+ mos) PMH-    CURRENT MEDICATIONS: Current Outpatient Medications (Ophthalmic Drugs)  Medication Sig   carboxymethylcellulose (REFRESH PLUS) 0.5 % SOLN Place 1 drop into both eyes 3 (three) times daily as needed (dry/irritated eyes.).   latanoprost  (XALATAN ) 0.005 % ophthalmic solution Place 1 drop into the left eye at bedtime.   No current facility-administered medications for this visit. (Ophthalmic Drugs)   Current Outpatient Medications (Other)  Medication Sig   acetaminophen  (TYLENOL ) 500 MG tablet Take 1 tablet (500 mg total) by mouth every 6 (six) hours as needed for mild pain, fever or headache.   Alpha-Lipoic Acid 600 MG TABS Take 600 mg by mouth daily. For neuropathy in legs (Patient taking differently: Take 100 mg by mouth daily.)   amiodarone  (PACERONE ) 200 MG tablet Take 1 tablet (200 mg total) by mouth daily.   amLODipine  (NORVASC ) 5 MG tablet Take 5 mg by mouth daily.   aspirin  EC 81  MG EC tablet Take 1 tablet (81 mg total) by mouth daily. Swallow whole.   docusate sodium  (COLACE) 100 MG capsule Take 1 capsule (100 mg total) by mouth daily.   ELIQUIS  2.5 MG TABS tablet TAKE (1) TABLET TWICE A DAY. (Patient taking differently: Take 2.5 mg by mouth 2 (two) times daily.)   feeding supplement (ENSURE ENLIVE / ENSURE PLUS) LIQD Take 237 mLs by mouth 2 (two) times daily between meals.   gabapentin  (NEURONTIN ) 100 MG capsule Take 1 capsule (100 mg total) by mouth at bedtime.   isosorbide  mononitrate (IMDUR ) 60 MG 24 hr tablet Take 1 tablet (60 mg total) by mouth daily.   levothyroxine  (SYNTHROID ) 112 MCG tablet Take 112 mcg by mouth daily.   MELATONIN PO Take 10 mg by mouth at bedtime.   metoprolol  succinate (TOPROL -XL) 25 MG 24 hr tablet Take 0.5 tablets (12.5 mg total) by mouth daily. Take with or immediately following a meal. (Patient taking differently: Take 12.5 mg by mouth 2 (two) times daily. Take with or immediately following a meal.)   Multiple Vitamins-Minerals (CENTRUM SILVER ULTRA WOMENS PO) Take 1 tablet by mouth every morning.   nitroGLYCERIN  (NITROSTAT ) 0.4 MG SL tablet PLACE 1 TABLET UNDER THE TONGUE AT ONSET OF CHEST PAIN EVERY 5 MINTUES UP TO 3 TIMES AS NEEDED   pantoprazole  (PROTONIX ) 40 MG tablet Take 1  tablet (40 mg total) by mouth 2 (two) times daily before a meal.   polyethylene glycol (MIRALAX  / GLYCOLAX ) 17 g packet Take 17 g by mouth daily as needed for mild constipation.   rosuvastatin  (CRESTOR ) 10 MG tablet TAKE 1 TABLET DAILY   simethicone  (MYLICON) 125 MG chewable tablet Chew 125 mg by mouth every 6 (six) hours as needed for flatulence.   No current facility-administered medications for this visit. (Other)   REVIEW OF SYSTEMS: ROS   Positive for: Cardiovascular, Eyes Negative for: Constitutional, Gastrointestinal, Neurological, Skin, Genitourinary, Musculoskeletal, HENT, Endocrine, Respiratory, Psychiatric, Allergic/Imm, Heme/Lymph Last edited by  Resa Delon ORN, COT on 01/30/2023  9:49 AM.      ALLERGIES Allergies  Allergen Reactions   Sotalol Other (See Comments)    Torsades    Tramadol  Other (See Comments)    Hallucination, bad-dream   Actos [Pioglitazone] Swelling   Adhesive [Tape] Other (See Comments)    Causes sores   Atorvastatin  Other (See Comments)    myalgia   Bactrim  [Sulfamethoxazole -Trimethoprim ] Itching and Rash    Itchy rash   Ciprofloxacin  Nausea Only   Contrast Media [Iodinated Contrast Media] Other (See Comments)    Headache (no action/pre-med required)   Latex Rash   Pedi-Pre Tape Spray [Wound Dressing Adhesive] Other (See Comments)    Causes sores   Sulfa  Antibiotics Other (See Comments)    Causes sores   PAST MEDICAL HISTORY Past Medical History:  Diagnosis Date   AICD (automatic cardioverter/defibrillator) present    Allergy    SESONAL   Aneurysm of infrarenal abdominal aorta (HCC)    Anxiety    ARTHRITIS    Arthritis    Atrial fibrillation (HCC)    CAD (coronary artery disease)    a. history of cardiac arrest 1993;  b. s/p LAD/LCX stenting in 2003;  c. 07/2011 Cath: LM nl, LAD patent stent, D1 80ost, LCX patent stent, RCA min irregs;  d. 04/2012 MV: EF 66%, no ishcemia;  e. 12/2013 Echo: Ef 55%, no rwma, Gr 1 DD, triv AI/MR, mildly dil LA;  f. 12/2013 Lexi MV: intermediate risk - apical ischemia and inf/infsept fixed defect, ? artifactual.   CAD in native artery 12/31/2013   Previous stents to LAD and Ramus patent on cath today 12/31/13 also There is severe disease in the ostial first diagonal which is unchanged from most recent cardiac catheterization. The right coronary artery could not be engaged selectively but nonselective angiography showed no significant disease in the proximal and midsegment.       Cataract    DENIES   Chronic back pain    Chronic sinus bradycardia    CKD (chronic kidney disease), stage III (HCC)    Clotting disorder (HCC)    DVT   COLITIS 12/02/2007    Qualifier: Diagnosis of  By: Kowalk CMA LEODIS), Chick     Diabetes mellitus without complication (HCC)    DENIES   Diverticulosis of colon (without mention of hemorrhage) 2007   Colonoscopy    Esophageal stricture    a. 2012 s/p dil.   Esophagitis, unspecified    a. 2012 EGD   EXTERNAL HEMORRHOIDS    GERD (gastroesophageal reflux disease)    omeprazole   H/O hiatal hernia    Hiatal hernia    a. 2012 EGD.   History of DVT in the past, not on Coumadin now    left  leg   HYPERCHOLESTEROLEMIA    Hypertension    ICD (implantable cardiac defibrillator) in  place    a. s/p initial ICD in 1993 in setting of cardiac arrest;  b. 01/2007 gen change: Guidant T135 Vitality DS VR single lead ICD.   Macular degeneration    gets injecton in eye every 5 weeks- last injection - 05/03/2013    Myocardial infarction [facility]) 1993   Near syncope 05/22/2015   Nephrolithiasis, just saw Dr Watt- OK    Orthostatic hypotension    Peripheral vascular disease (HCC)    ???   RECTAL BLEEDING 12/03/2007   Qualifier: Diagnosis of  By: Jakie MD NOLIA Lenis R    Unspecified gastritis and gastroduodenitis without mention of hemorrhage    a. 2003 EGD->not noted on 2012 EGD.   Unstable angina, neg MI, cath stable.maybe GI 12/30/2013   Past Surgical History:  Procedure Laterality Date   BREAST BIOPSY Left 11/2013   CARDIAC CATHETERIZATION     CARDIAC CATHETERIZATION  01/01/15   patent stents -there is severe disease in ostial 1st diag but without change.    CARDIAC DEFIBRILLATOR PLACEMENT  02/05/2007   Guidant   CARDIOVASCULAR STRESS TEST  12/30/2013   abnormal   CARDIOVERSION N/A 02/13/2019   Procedure: CARDIOVERSION;  Surgeon: Francyne Headland, MD;  Location: [facility] ENDOSCOPY;  Service: Cardiovascular;  Laterality: N/A;   CATARACT EXTRACTION Right    Dr. Octavia   COLONOSCOPY     COLONOSCOPY WITH PROPOFOL  N/A 03/30/2020   Procedure: COLONOSCOPY WITH PROPOFOL ;  Surgeon: Eartha Angelia Sieving, MD;  Location:  [facility] END[facility];  Service: Gastroenterology;  Laterality: N/A;   coronary stents      CYSTOSCOPY WITH URETEROSCOPY Right 05/20/2013   Procedure: CYSTOSCOPY, RIGHT URETEROSCOPY STONE EXTRACTION, Insertion of right DOUBLE J STENT ;  Surgeon: Norleen Watt, MD;  Location: [facility] ORS;  Service: Urology;  Laterality: Right;   CYSTOSCOPY WITH URETEROSCOPY AND STENT PLACEMENT N/A 06/03/2013   Procedure: SECOND LOOK CYSTOSCOPY WITH URETEROSCOPY  HOLMIUM LASER LITHO AND STONE EXTRACTION [name] ;  Surgeon: Norleen JINNY Watt, MD;  Location: [facility] ORS;  Service: Urology;  Laterality: N/A;   ESOPHAGOGASTRODUODENOSCOPY (EGD) WITH PROPOFOL  N/A 11/20/2019   Procedure: ESOPHAGOGASTRODUODENOSCOPY (EGD) WITH PROPOFOL ;  Surgeon: Shaaron Lamar HERO, MD;  Location: [facility];  Service: Endoscopy;  Laterality: N/A;   GIVENS CAPSULE STUDY N/A 10/30/2016   Procedure: GIVENS CAPSULE STUDY;  Surgeon: Abran Norleen SAILOR, MD;  Location: [facility] ENDOSCOPY;  Service: Endoscopy;  Laterality: N/A;  NEEDS TO BE ADMITTED FOR OBSERVATION PT. HAS DEFIB   HEMORROIDECTOMY  80's   HOLMIUM LASER APPLICATION Right 05/20/2013   Procedure: HOLMIUM LASER APPLICATION;  Surgeon: Norleen Watt, MD;  Location: [facility] ORS;  Service: Urology;  Laterality: Right;   HYSTEROSCOPY WITH D & C  09/02/2010   Procedure: DILATATION AND CURETTAGE (D&C) /HYSTEROSCOPY;  Surgeon: Charlie HERO Croak;  Location: [facility] ORS;  Service: Gynecology;  Laterality: N/A;  Dilation and Curettage with Hysteroscopy and Polypectomy   IMPLANTABLE CARDIOVERTER DEFIBRILLATOR (ICD) GENERATOR CHANGE N/A 02/10/2014   Procedure: ICD GENERATOR CHANGE;  Surgeon: Headland Francyne, MD;  Location: [facility] CATH LAB;  Service: Cardiovascular;  Laterality: N/A;   INTRAMEDULLARY (IM) NAIL INTERTROCHANTERIC Left 09/09/2022   Procedure: INTRAMEDULLARY (IM) NAIL INTERTROCHANTERIC;  Surgeon: Ernie Cough, MD;  Location: [facility] ORS;  Service: Orthopedics;  Laterality: Left;   LEFT HEART CATHETERIZATION WITH CORONARY ANGIOGRAM N/A 07/28/2011    Procedure: LEFT HEART CATHETERIZATION WITH CORONARY ANGIOGRAM;  Surgeon: Dorn JINNY Lesches, MD;  Location[facility] C[facility] LAB;  Service: Cardiovascular;  Laterality: N/A;   LEFT HEART CATHETERIZATION WITH  CORONARY ANGIOGRAM N/A 12/31/2013   Procedure: LEFT HEART CATHETERIZATION WITH CORONARY ANGIOGRAM;  Surgeon: Deatrice DELENA Cage, MD;  Location: [facility] CATH LAB;  Service: Cardiovascular;  Laterality: N/A;   FAMILY HISTORY Family History  Problem Relation Age of Onset   Stroke Mother    Other Mother        brain tumor   Other Father        MI   Heart attack Father    Stroke Sister    Macular degeneration Sister    Diabetes Daughter    Cancer Daughter        ovarian   Colon polyps Daughter    Atrial fibrillation Sister    Hyperlipidemia Sister    Osteoporosis Sister    Stroke Sister    Uterine cancer Sister    Heart attack Brother    Heart disease Brother    Asthma Brother    Breast cancer Neg Hx    SOCIAL HISTORY Social History   Tobacco Use   Smoking status: Never   Smokeless tobacco: Never  Vaping Use   Vaping status: Never Used  Substance Use Topics   Alcohol  use: No   Drug use: No       OPHTHALMIC EXAM:  Base Eye Exam     Visual Acuity (Snellen - Linear)       Right Left   Dist cc 20/150 20/150   Dist ph cc 20/100 -3 20/100 -3         Tonometry (Tonopen, 10:00 AM)       Right Left   Pressure 13 12         Pupils       Pupils Dark Light Shape React APD   Right PERRL 3 2 Round Brisk None   Left PERRL 3 2 Round Brisk None         Visual Fields       Left Right    Full Full         Extraocular Movement       Right Left    Full, Ortho Full, Ortho         Neuro/Psych     Oriented x3: Yes   Mood/Affect: Normal         Dilation     Both eyes: 2.5% Phenylephrine  @ 10:00 AM           Slit Lamp and Fundus Exam     Slit Lamp Exam       Right Left   Lids/Lashes Dermatochalasis - upper lid Dermatochalasis - upper lid    Conjunctiva/Sclera White and quiet White and quiet   Cornea 1-2+ Punctate epithelial erosions, well healed cataract wound 2+ fine Punctate epithelial erosions, well healed cataract wound   Anterior Chamber deep and clear deep and clear   Iris Round and dilated Round and dilated   Lens PC IOL in good position PC IOL in good position   Anterior Vitreous mild syneresis, Posterior vitreous detachment, Vitreous condensations mild syneresis, silicone oil microbubbles, PVD         Fundus Exam       Right Left   Disc mild Pallor, Sharp rim, temporal PPA mild Pallor, Sharp rim, temporal PPA   C/D Ratio 0.1 0.1   Macula Blunted foveal reflex, central GA with +CNV and IRH centrally Drusen, RPE mottling and clumping Blunted foveal reflex, large area of GA centrally, Drusen, RPE mottling, clumping and atrophy, no heme   Vessels attenuated, mild  tortuosity attenuated, mild tortuosity   Periphery Attached, reticular degeneration, No heme Attached, reticular degeneration, No heme           Refraction     Wearing Rx       Sphere Cylinder Axis Add   Right -1.00 +1.25 160 +2.25   Left -1.00 +0.75 150 +2.25           IMAGING AND PROCEDURES  Imaging and Procedures for 01/30/2023  OCT, Retina - OU - Both Eyes       Right Eye Quality was good. Central Foveal Thickness: 320. Progression has worsened. Findings include normal foveal contour, no IRF, no SRF, retinal drusen , subretinal hyper-reflective material, pigment epithelial detachment, outer retinal atrophy (Central ORA with scattered PED / [facility] -- PED/SRHM slightly increased nasal fovea, PED temporal macula -- slightly improved, trace cystic changes temp fovea -- stably improved).   Left Eye Quality was good. Central Foveal Thickness: 204. Progression has been stable. Findings include normal foveal contour, no IRF, no SRF, retinal drusen , subretinal hyper-reflective material, outer retinal atrophy (Central ORA with [facility]).    Notes *Images captured and stored on drive  Diagnosis / Impression:  OD: Exudative ARMD - Central ORA with scattered PED / [facility] -- PED/SRHM slightly increased nasal fovea, PED temporal macula -- slightly improved, trace cystic changes temp fovea -- stably improved OS: Nonexudative ARMD - Central ORA with [facility]  Clinical management:  See below  Abbreviations: NFP - Normal foveal profile. CME - cystoid macular edema. PED - pigment epithelial detachment. IRF - intraretinal fluid. SRF - subretinal fluid. EZ - ellipsoid zone. ERM - epiretinal membrane. ORA - outer retinal atrophy. ORT - outer retinal tubulation. SRHM - subretinal hyper-reflective material. IRHM - intraretinal hyper-reflective material      Intravitreal Injection, Pharmacologic Agent - OD - Right Eye       Time Out 01/30/2023. 11:03 AM. Confirmed correct patient, procedure, site, and patient consented.   Anesthesia Topical anesthesia was used. Anesthetic medications included Lidocaine  2%, Proparacaine 0.5%.   Procedure Preparation included 5% betadine  to ocular surface, eyelid speculum. A (32g) needle was used.   Injection: 1.25 mg Bevacizumab  1.25mg /0.58ml   Route: Intravitreal, Site: Right Eye   NDC: [id number], Lot: [id number], Expiration date: 03/01/2023   Post-op Post injection exam found visual acuity of at least counting fingers. The patient tolerated the procedure well. There were no complications. The patient received written and verbal post procedure care education. Post injection medications were not given.             ASSESSMENT/PLAN:    ICD-10-CM   1. Exudative age-related macular degeneration of right eye with active choroidal neovascularization (HCC)  H35.3211 OCT, Retina - OU - Both Eyes    Intravitreal Injection, Pharmacologic Agent - OD - Right Eye    Bevacizumab  (AVASTIN ) SOLN 1.25 mg    2. Advanced atrophic nonexudative age-related macular degeneration of left eye with subfoveal  involvement  H35.3124     3. Essential hypertension  I10     4. Hypertensive retinopathy of both eyes  H35.033     5. Pseudophakia, both eyes  Z96.1       Exudative age related macular degeneration, right eye - delayed follow up from 8 weeks to 5 months (July 18, 2022 to December 25, 2022) due to hip fracture, sx and rehab - previously managed by Dr. Elner - last visit and injection was 09.07.24 (IVA OD) -- pt was on a q6-8 wk maintenance schedule --  presented at 4+ mos since last IVA OD w/ [facility] - s/p IVA OD #1 (01.30.24), #2 (03.12.24), #3 (05.07.24), #4 (07.02.24), #5 (12.09.24)  - BCVA OD improved to 20/100 from 20/150   - OCT OD: Central ORA with scattered PED / [facility] -- PED/SRHM slightly increased nasal fovea, PED temporal macula slightly improved, trace cystic changes temp fovea -- stably improved at 5 weeks  - exam shows increase in [facility] temporal macula - recommend IVA OD #6 today, 1.14.25  with follow up back to 4 wks  - pt wishes to proceed with injection  - RBA of procedure discussed, questions answered - IVA informed consent obtained and signed 01.30.24 - see procedure note  - f/u in 4 wks, DFE, OCT, possible injxn  2. Age related macular degeneration, non-exudative, left eye  - advanced stage with large central area of GA  - BCVA decreased to 20/100 from 20/80  - The incidence, anatomy, and pathology of dry AMD, risk of progression, and the AREDS and AREDS 2 study including smoking risks discussed with patient.  - Recommend amsler grid monitoring  3,4. Hypertensive retinopathy OU - discussed importance of tight BP control - continue to monitor  5. Pseudophakia OU  - s/p CE/IOL OU (Dr. JAYSON Gaudy)  - IOLs in good position, doing well  - continue to monitor  Ophthalmic Meds Ordered this visit:  Meds ordered this encounter  Medications   Bevacizumab  (AVASTIN ) SOLN 1.25 mg     Return in about 4 weeks (around 02/27/2023) for f/u exu ARMD OD, DFE, OCT, Possible  Injxn.  There are no Patient Instructions on file for this visit.   Explained the diagnoses, plan, and follow up with the patient and they expressed understanding.  Patient expressed understanding of the importance of proper follow up care.   This document serves as a record of services personally performed by Redell JUDITHANN Hans, MD, PhD. It was created on their behalf by Wanda GEANNIE Keens, COT an ophthalmic technician. The creation of this record is the provider's dictation and/or activities during the visit.    Electronically signed by:  Wanda GEANNIE Keens, COT  02/01/23 1:29 AM  This document serves as a record of services personally performed by Redell JUDITHANN Hans, MD, PhD. It was created on their behalf by Alan PARAS. Delores, OA an ophthalmic technician. The creation of this record is the provider's dictation and/or activities during the visit.    Electronically signed by: Alan PARAS. Delores, OA 02/01/23 1:29 AM  Redell JUDITHANN Hans, M.D., Ph.D. Diseases & Surgery of the Retina and Vitreous [facility]  I have reviewed the above documentation for accuracy and completeness, and I agree with the above. Redell JUDITHANN Hans, M.D., Ph.D. 02/01/23 1:29 AM   Abbreviations: M myopia (nearsighted); A astigmatism; H hyperopia (farsighted); P presbyopia; Mrx spectacle prescription;  CTL contact lenses; OD right eye; OS left eye; OU both eyes  XT exotropia; ET esotropia; PEK punctate epithelial keratitis; PEE punctate epithelial erosions; DES dry eye syndrome; MGD meibomian gland dysfunction; ATs artificial tears; PFAT's preservative free artificial tears; NSC nuclear sclerotic cataract; PSC posterior subcapsular cataract; ERM epi-retinal membrane; PVD posterior vitreous detachment; RD retinal detachment; DM diabetes mellitus; DR diabetic retinopathy; NPDR non-proliferative diabetic retinopathy; PDR proliferative diabetic retinopathy; CSME clinically significant macular edema; DME diabetic  macular edema; dbh dot blot hemorrhages; CWS cotton wool spot; POAG primary open angle glaucoma; C/D cup-to-disc ratio; HVF humphrey visual field; GVF goldmann visual field; OCT optical coherence  tomography; IOP intraocular pressure; BRVO Branch retinal vein occlusion; CRVO central retinal vein occlusion; CRAO central retinal artery occlusion; BRAO branch retinal artery occlusion; RT retinal tear; SB scleral buckle; PPV pars plana vitrectomy; VH Vitreous hemorrhage; PRP panretinal laser photocoagulation; IVK intravitreal kenalog ; VMT vitreomacular traction; MH Macular hole;  NVD neovascularization of the disc; NVE neovascularization elsewhere; AREDS age related eye disease study; ARMD age related macular degeneration; POAG primary open angle glaucoma; EBMD epithelial/anterior basement membrane dystrophy; ACIOL anterior chamber intraocular lens; IOL intraocular lens; PCIOL posterior chamber intraocular lens; Phaco/IOL phacoemulsification with intraocular lens placement; PRK photorefractive keratectomy; LASIK laser assisted in situ keratomileusis; HTN hypertension; DM diabetes mellitus; COPD chronic obstructive pulmonary disease

## 2023-01-16 NOTE — Telephone Encounter (Signed)
 [name], Renee BROCKS, MD  Davee Comer CROME, RN Caller: Unspecified (Yesterday, 12:29 PM) I'm covering for Dr. BROCKS - looks like she has a history of orthostatic hypotension - I would not advise taking the amlodipine  as suggested. Agree with hydration. These episodes may also be arrhythmias - such as aifb or VT, since she has had these before - it sounds like she has few options and she had discussed goals of care with Dr. BROCKS in the office. If her symptoms continue to worsen, probably would advise her to go to the ER.  Dr. Mona Sous over the information above with the patient. Pt reports that she does not eat much either. Informed her that low blood sugar can cause dizziness as well. She will try to eat more consistently. Offered to make her an appointment with an APP, but she said she would call if she felt like she needed to be seen.  ER precautions given She verbalized understanding of all information

## 2023-01-28 NOTE — Telephone Encounter (Signed)
 Agree - no change in meds. Try to stay well hydrated. OK to do an extra pacemaker download which may or may not help diagnostically (since she only has a ventricular lead, we will only see AFib if there is a marked change in her rate, which is not apparent based on her reports)

## 2023-01-30 ENCOUNTER — Ambulatory Visit (INDEPENDENT_AMBULATORY_CARE_PROVIDER_SITE_OTHER): Payer: Medicare (Managed Care) | Admitting: Ophthalmology

## 2023-01-30 ENCOUNTER — Encounter (INDEPENDENT_AMBULATORY_CARE_PROVIDER_SITE_OTHER): Payer: Self-pay | Admitting: Ophthalmology

## 2023-01-30 DIAGNOSIS — H353211 Exudative age-related macular degeneration, right eye, with active choroidal neovascularization: Secondary | ICD-10-CM | POA: Diagnosis not present

## 2023-01-30 DIAGNOSIS — Z961 Presence of intraocular lens: Secondary | ICD-10-CM

## 2023-01-30 DIAGNOSIS — H35033 Hypertensive retinopathy, bilateral: Secondary | ICD-10-CM | POA: Diagnosis not present

## 2023-01-30 DIAGNOSIS — I10 Essential (primary) hypertension: Secondary | ICD-10-CM | POA: Diagnosis not present

## 2023-01-30 DIAGNOSIS — H353124 Nonexudative age-related macular degeneration, left eye, advanced atrophic with subfoveal involvement: Secondary | ICD-10-CM

## 2023-01-30 MED ORDER — BEVACIZUMAB CHEMO INJECTION 1.25MG/0.05ML SYRINGE FOR KALEIDOSCOPE
1.2500 mg | INTRAVITREAL | Status: AC | PRN
Start: 2023-01-30 — End: 2023-01-30
  Administered 2023-01-30: 1.25 mg via INTRAVITREAL

## 2023-02-15 NOTE — Progress Notes (Shared)
[facility] - Clinic Note  02/27/2023     CHIEF COMPLAINT Patient presents for No chief complaint on file.   HISTORY OF PRESENT ILLNESS: [name] is a 89 y.o. female who presents to the clinic today for:     Referring physician: No referring provider defined for this encounter.  HISTORICAL INFORMATION:   Selected notes from the MEDICAL RECORD NUMBER [name] -- transferring care due to insurance LEE: 09.07.23 -- BCVA CF OU Ocular Hx- ex ARMD OD -- s/p multiple IVA, last IVA OD 09.07.24 (4+ mos) PMH-    CURRENT MEDICATIONS: Current Outpatient Medications (Ophthalmic Drugs)  Medication Sig   carboxymethylcellulose (REFRESH PLUS) 0.5 % SOLN Place 1 drop into both eyes 3 (three) times daily as needed (dry/irritated eyes.).   latanoprost (XALATAN) 0.005 % ophthalmic solution Place 1 drop into the left eye at bedtime.   No current facility-administered medications for this visit. (Ophthalmic Drugs)   Current Outpatient Medications (Other)  Medication Sig   acetaminophen (TYLENOL) 500 MG tablet Take 1 tablet (500 mg total) by mouth every 6 (six) hours as needed for mild pain, fever or headache.   Alpha-Lipoic Acid 600 MG TABS Take 600 mg by mouth daily. For neuropathy in legs (Patient taking differently: Take 100 mg by mouth daily.)   amiodarone (PACERONE) 200 MG tablet Take 1 tablet (200 mg total) by mouth daily.   amLODipine (NORVASC) 5 MG tablet Take 5 mg by mouth daily.   aspirin EC 81 MG EC tablet Take 1 tablet (81 mg total) by mouth daily. Swallow whole.   docusate sodium (COLACE) 100 MG capsule Take 1 capsule (100 mg total) by mouth daily.   ELIQUIS 2.5 MG TABS tablet TAKE (1) TABLET TWICE A DAY. (Patient taking differently: Take 2.5 mg by mouth 2 (two) times daily.)   feeding supplement (ENSURE ENLIVE / ENSURE PLUS) LIQD Take 237 mLs by mouth 2 (two) times daily between meals.   gabapentin (NEURONTIN) 100 MG capsule Take 1 capsule (100 mg  total) by mouth at bedtime.   isosorbide mononitrate (IMDUR) 60 MG 24 hr tablet Take 1 tablet (60 mg total) by mouth daily.   levothyroxine (SYNTHROID) 112 MCG tablet Take 112 mcg by mouth daily.   MELATONIN PO Take 10 mg by mouth at bedtime.   metoprolol succinate (TOPROL-XL) 25 MG 24 hr tablet Take 0.5 tablets (12.5 mg total) by mouth daily. Take with or immediately following a meal. (Patient taking differently: Take 12.5 mg by mouth 2 (two) times daily. Take with or immediately following a meal.)   Multiple Vitamins-Minerals (CENTRUM SILVER ULTRA WOMENS PO) Take 1 tablet by mouth every morning.   nitroGLYCERIN (NITROSTAT) 0.4 MG SL tablet PLACE 1 TABLET UNDER THE TONGUE AT ONSET OF CHEST PAIN EVERY 5 MINTUES UP TO 3 TIMES AS NEEDED   pantoprazole (PROTONIX) 40 MG tablet Take 1 tablet (40 mg total) by mouth 2 (two) times daily before a meal.   polyethylene glycol (MIRALAX / GLYCOLAX) 17 g packet Take 17 g by mouth daily as needed for mild constipation.   rosuvastatin (CRESTOR) 10 MG tablet TAKE 1 TABLET DAILY   simethicone (MYLICON) 125 MG chewable tablet Chew 125 mg by mouth every 6 (six) hours as needed for flatulence.   No current facility-administered medications for this visit. (Other)   REVIEW OF SYSTEMS:    ALLERGIES Allergies  Allergen Reactions   Sotalol Other (See Comments)    Torsades    Tramadol Other (  See Comments)    Hallucination, bad-dream   Actos [Pioglitazone] Swelling   Adhesive [Tape] Other (See Comments)    Causes sores   Atorvastatin Other (See Comments)    myalgia   Bactrim [Sulfamethoxazole-Trimethoprim] Itching and Rash    Itchy rash   Ciprofloxacin Nausea Only   Contrast Media [Iodinated Contrast Media] Other (See Comments)    Headache (no action/pre-med required)   Latex Rash   Pedi-Pre Tape Spray [Wound Dressing Adhesive] Other (See Comments)    Causes sores   Sulfa Antibiotics Other (See Comments)    Causes sores   PAST MEDICAL HISTORY Past  Medical History:  Diagnosis Date   AICD (automatic cardioverter/defibrillator) present    Allergy    SESONAL   Aneurysm of infrarenal abdominal aorta (HCC)    Anxiety    ARTHRITIS    Arthritis    Atrial fibrillation (HCC)    CAD (coronary artery disease)    a. history of cardiac arrest 1993;  b. s/p LAD/LCX stenting in 2003;  c. 07/2011 Cath: LM nl, LAD patent stent, D1 80ost, LCX patent stent, RCA min irregs;  d. 04/2012 MV: EF 66%, no ishcemia;  e. 12/2013 Echo: Ef 55%, no rwma, Gr 1 DD, triv AI/MR, mildly dil LA;  f. 12/2013 Lexi MV: intermediate risk - apical ischemia and inf/infsept fixed defect, ? artifactual.   CAD in native artery 12/31/2013   Previous stents to LAD and Ramus patent on cath today 12/31/13 also There is severe disease in the ostial first diagonal which is unchanged from most recent cardiac catheterization. The right coronary artery could not be engaged selectively but nonselective angiography showed no significant disease in the proximal and midsegment.       Cataract    DENIES   Chronic back pain    Chronic sinus bradycardia    CKD (chronic kidney disease), stage III (HCC)    Clotting disorder (HCC)    DVT   COLITIS 12/02/2007   Qualifier: Diagnosis of  By: Koleen Distance CMA Duncan Dull), Hulan Saas     Diabetes mellitus without complication (HCC)    DENIES   Diverticulosis of colon (without mention of hemorrhage) 2007   Colonoscopy    Esophageal stricture    a. 2012 s/p dil.   Esophagitis, unspecified    a. 2012 EGD   EXTERNAL HEMORRHOIDS    GERD (gastroesophageal reflux disease)    omeprazole   H/O hiatal hernia    Hiatal hernia    a. 2012 EGD.   History of DVT in the past, not on Coumadin now    left  leg   HYPERCHOLESTEROLEMIA    Hypertension    ICD (implantable cardiac defibrillator) in place    a. s/p initial ICD in 1993 in setting of cardiac arrest;  b. 01/2007 gen change: Guidant T135 Vitality DS VR single lead ICD.   Macular degeneration    gets injecton in  eye every 5 weeks- last injection - 05/03/2013    Myocardial infarction [facility]) 1993   Near syncope 05/22/2015   Nephrolithiasis, just saw Dr Annabell Howells- "OK"    Orthostatic hypotension    Peripheral vascular disease (HCC)    ???   RECTAL BLEEDING 12/03/2007   Qualifier: Diagnosis of  By: Jarold Motto MD Mervyn Skeeters R    Unspecified gastritis and gastroduodenitis without mention of hemorrhage    a. 2003 EGD->not noted on 2012 EGD.   Unstable angina, neg MI, cath stable.maybe GI 12/30/2013   Past Surgical History:  Procedure Laterality Date  BREAST BIOPSY Left 11/2013   CARDIAC CATHETERIZATION     CARDIAC CATHETERIZATION  01/01/15   patent stents -there is severe disease in ostial 1st diag but without change.    CARDIAC DEFIBRILLATOR PLACEMENT  02/05/2007   Guidant   CARDIOVASCULAR STRESS TEST  12/30/2013   abnormal   CARDIOVERSION N/A 02/13/2019   Procedure: CARDIOVERSION;  Surgeon: Thurmon Fair, MD;  Location: [facility] ENDOSCOPY;  Service: Cardiovascular;  Laterality: N/A;   CATARACT EXTRACTION Right    Dr. Dione Booze   COLONOSCOPY     COLONOSCOPY WITH PROPOFOL N/A 03/30/2020   Procedure: COLONOSCOPY WITH PROPOFOL;  Surgeon: Dolores Frame, MD;  Location: [facility];  Service: Gastroenterology;  Laterality: N/A;   coronary stents      CYSTOSCOPY WITH URETEROSCOPY Right 05/20/2013   Procedure: CYSTOSCOPY, RIGHT URETEROSCOPY STONE EXTRACTION, Insertion of right DOUBLE J STENT ;  Surgeon: Bjorn Pippin, MD;  Location: [facility] ORS;  Service: Urology;  Laterality: Right;   CYSTOSCOPY WITH URETEROSCOPY AND STENT PLACEMENT N/A 06/03/2013   Procedure: SECOND LOOK CYSTOSCOPY WITH URETEROSCOPY  HOLMIUM LASER LITHO AND STONE EXTRACTION Newman Nip ;  Surgeon: Anner Crete, MD;  Location: [facility] ORS;  Service: Urology;  Laterality: N/A;   ESOPHAGOGASTRODUODENOSCOPY (EGD) WITH PROPOFOL N/A 11/20/2019   Procedure: ESOPHAGOGASTRODUODENOSCOPY (EGD) WITH PROPOFOL;  Surgeon: Corbin Ade, MD;  Location: [facility];   Service: Endoscopy;  Laterality: N/A;   GIVENS CAPSULE STUDY N/A 10/30/2016   Procedure: GIVENS CAPSULE STUDY;  Surgeon: Hilarie Fredrickson, MD;  Location: [facility] ENDOSCOPY;  Service: Endoscopy;  Laterality: N/A;  NEEDS TO BE ADMITTED FOR OBSERVATION PT. HAS DEFIB   HEMORROIDECTOMY  80's   HOLMIUM LASER APPLICATION Right 05/20/2013   Procedure: HOLMIUM LASER APPLICATION;  Surgeon: Bjorn Pippin, MD;  Location: [facility];  Service: Urology;  Laterality: Right;   HYSTEROSCOPY WITH D & C  09/02/2010   Procedure: DILATATION AND CURETTAGE (D&C) /HYSTEROSCOPY;  Surgeon: Meriel Pica;  Location: [facility] ORS;  Service: Gynecology;  Laterality: N/A;  Dilation and Curettage with Hysteroscopy and Polypectomy   IMPLANTABLE CARDIOVERTER DEFIBRILLATOR (ICD) GENERATOR CHANGE N/A 02/10/2014   Procedure: ICD GENERATOR CHANGE;  Surgeon: Thurmon Fair, MD;  Location: [facility] CATH LAB;  Service: Cardiovascular;  Laterality: N/A;   INTRAMEDULLARY (IM) NAIL INTERTROCHANTERIC Left 09/09/2022   Procedure: INTRAMEDULLARY (IM) NAIL INTERTROCHANTERIC;  Surgeon: Durene Romans, MD;  Location: [facility];  Service: Orthopedics;  Laterality: Left;   LEFT HEART CATHETERIZATION WITH CORONARY ANGIOGRAM N/A 07/28/2011   Procedure: LEFT HEART CATHETERIZATION WITH CORONARY ANGIOGRAM;  Surgeon: Runell Gess, MD;  [facility] CATH LAB;  Service: Cardiovascular;  Laterality: N/A;   LEFT HEART CATHETERIZATION WITH CORONARY ANGIOGRAM N/A 12/31/2013   Procedure: LEFT HEART CATHETERIZATION WITH CORONARY ANGIOGRAM;  Surgeon: Iran Ouch, MD;  Location: [facility] CATH LAB;  Service: Cardiovascular;  Laterality: N/A;   FAMILY HISTORY Family History  Problem Relation Age of Onset   Stroke Mother    Other Mother        brain tumor   Other Father        MI   Heart attack Father    Stroke Sister    Macular degeneration Sister    Diabetes Daughter    Cancer Daughter        ovarian   Colon polyps Daughter    Atrial fibrillation Sister    Hyperlipidemia Sister     Osteoporosis Sister    Stroke Sister    Uterine cancer Sister  Heart attack Brother    Heart disease Brother    Asthma Brother    Breast cancer Neg Hx    SOCIAL HISTORY Social History   Tobacco Use   Smoking status: Never   Smokeless tobacco: Never  Vaping Use   Vaping status: Never Used  Substance Use Topics   Alcohol use: No   Drug use: No       OPHTHALMIC EXAM:  Not recorded    IMAGING AND PROCEDURES  Imaging and Procedures for 02/27/2023           ASSESSMENT/PLAN:    ICD-10-CM   1. Exudative age-related macular degeneration of right eye with active choroidal neovascularization (HCC)  H35.3211     2. Advanced atrophic nonexudative age-related macular degeneration of left eye with subfoveal involvement  H35.3124     3. Essential hypertension  I10     4. Hypertensive retinopathy of both eyes  H35.033     5. Pseudophakia, both eyes  Z96.1        Exudative age related macular degeneration, right eye - delayed follow up from 8 weeks to 5 months (July 18, 2022 to December 25, 2022) due to hip fracture, sx and rehab - previously managed by Dr. Luciana Axe - last visit and injection was 09.07.24 (IVA OD) -- pt was on a q6-8 wk maintenance schedule -- presented at 4+ mos since last IVA OD w/ [facility] - s/p IVA OD #1 (01.30.24), #2 (03.12.24), #3 (05.07.24), #4 (07.02.24), #5 (12.09.24), #6 (01.14.25)  - BCVA OD improved to 20/100 from 20/150   - OCT OD: [facility] with scattered PED / [facility] -- PED/SRHM slightly increased nasal fovea, PED temporal macula slightly improved, trace cystic changes temp fovea -- stably improved at 5 weeks  - exam shows increase in [facility] temporal macula - recommend IVA OD #7 today, 02.11.25  with follow up back to 4 wks  - pt wishes to proceed with injection  - RBA of procedure discussed, questions answered - IVA informed consent obtained and signed 01.30.24 - see procedure note  - f/u in 4 wks, DFE, OCT, possible injxn  2. Age related  macular degeneration, non-exudative, left eye  - advanced stage with large central area of GA  - BCVA decreased to 20/100 from 20/80  - The incidence, anatomy, and pathology of dry AMD, risk of progression, and the AREDS and AREDS 2 study including smoking risks discussed with patient.  - Recommend amsler grid monitoring  3,4. Hypertensive retinopathy OU - discussed importance of tight BP control - continue to monitor  5. Pseudophakia OU  - s/p CE/IOL OU (Dr. Zetta Bills)  - IOLs in good position, doing well  - continue to monitor  Ophthalmic Meds Ordered this visit:  No orders of the defined types were placed in this encounter.    No follow-ups on file.  There are no Patient Instructions on file for this visit.   Explained the diagnoses, plan, and follow up with the patient and they expressed understanding.  Patient expressed understanding of the importance of proper follow up care.   This document serves as a record of services personally performed by Karie Chimera, MD, PhD. It was created on their behalf by Charlette Caffey, COT an ophthalmic technician. The creation of this record is the provider's dictation and/or activities during the visit.    Electronically signed by:  Charlette Caffey, COT  02/15/23 7:33 AM    Karie Chimera, M.D., Ph.D. Diseases &  Surgery of the Retina and Vitreous [facility]   Abbreviations: M myopia (nearsighted); A astigmatism; H hyperopia (farsighted); P presbyopia; Mrx spectacle prescription;  CTL contact lenses; OD right eye; OS left eye; OU both eyes  XT exotropia; ET esotropia; PEK punctate epithelial keratitis; PEE punctate epithelial erosions; DES dry eye syndrome; MGD meibomian gland dysfunction; ATs artificial tears; PFAT's preservative free artificial tears; NSC nuclear sclerotic cataract; PSC posterior subcapsular cataract; ERM epi-retinal membrane; PVD posterior vitreous detachment; RD retinal detachment; DM  diabetes mellitus; DR diabetic retinopathy; NPDR non-proliferative diabetic retinopathy; PDR proliferative diabetic retinopathy; CSME clinically significant macular edema; DME diabetic macular edema; dbh dot blot hemorrhages; CWS cotton wool spot; POAG primary open angle glaucoma; C/D cup-to-disc ratio; HVF humphrey visual field; GVF goldmann visual field; OCT optical coherence tomography; IOP intraocular pressure; BRVO Branch retinal vein occlusion; CRVO central retinal vein occlusion; CRAO central retinal artery occlusion; BRAO branch retinal artery occlusion; RT retinal tear; SB scleral buckle; PPV pars plana vitrectomy; VH Vitreous hemorrhage; PRP panretinal laser photocoagulation; IVK intravitreal kenalog; VMT vitreomacular traction; MH Macular hole;  NVD neovascularization of the disc; NVE neovascularization elsewhere; AREDS age related eye disease study; ARMD age related macular degeneration; POAG primary open angle glaucoma; EBMD epithelial/anterior basement membrane dystrophy; ACIOL anterior chamber intraocular lens; IOL intraocular lens; PCIOL posterior chamber intraocular lens; Phaco/IOL phacoemulsification with intraocular lens placement; PRK photorefractive keratectomy; LASIK laser assisted in situ keratomileusis; HTN hypertension; DM diabetes mellitus; COPD chronic obstructive pulmonary disease

## 2023-02-27 ENCOUNTER — Encounter (INDEPENDENT_AMBULATORY_CARE_PROVIDER_SITE_OTHER): Payer: Medicare (Managed Care) | Admitting: Ophthalmology

## 2023-02-27 DIAGNOSIS — I10 Essential (primary) hypertension: Secondary | ICD-10-CM

## 2023-02-27 DIAGNOSIS — Z961 Presence of intraocular lens: Secondary | ICD-10-CM

## 2023-02-27 DIAGNOSIS — H353211 Exudative age-related macular degeneration, right eye, with active choroidal neovascularization: Secondary | ICD-10-CM

## 2023-02-27 DIAGNOSIS — H35033 Hypertensive retinopathy, bilateral: Secondary | ICD-10-CM

## 2023-02-27 DIAGNOSIS — H353124 Nonexudative age-related macular degeneration, left eye, advanced atrophic with subfoveal involvement: Secondary | ICD-10-CM

## 2023-02-28 NOTE — Progress Notes (Incomplete)
[facility] - Clinic Note  03/07/2023     CHIEF COMPLAINT Patient presents for No chief complaint on file.   HISTORY OF PRESENT ILLNESS: [name] is a 89 y.o. female who presents to the clinic today for:     Referring physician: No referring provider defined for this encounter.  HISTORICAL INFORMATION:   Selected notes from the MEDICAL RECORD NUMBER [name] -- transferring care due to insurance LEE: 09.07.23 -- BCVA CF OU Ocular Hx- ex ARMD OD -- s/p multiple IVA, last IVA OD 09.07.24 (4+ mos) PMH-    CURRENT MEDICATIONS: Current Outpatient Medications (Ophthalmic Drugs)  Medication Sig   carboxymethylcellulose (REFRESH PLUS) 0.5 % SOLN Place 1 drop into both eyes 3 (three) times daily as needed (dry/irritated eyes.).   latanoprost (XALATAN) 0.005 % ophthalmic solution Place 1 drop into the left eye at bedtime.   No current facility-administered medications for this visit. (Ophthalmic Drugs)   Current Outpatient Medications (Other)  Medication Sig   acetaminophen (TYLENOL) 500 MG tablet Take 1 tablet (500 mg total) by mouth every 6 (six) hours as needed for mild pain, fever or headache.   Alpha-Lipoic Acid 600 MG TABS Take 600 mg by mouth daily. For neuropathy in legs (Patient taking differently: Take 100 mg by mouth daily.)   amiodarone (PACERONE) 200 MG tablet Take 1 tablet (200 mg total) by mouth daily.   amLODipine (NORVASC) 5 MG tablet Take 5 mg by mouth daily.   aspirin EC 81 MG EC tablet Take 1 tablet (81 mg total) by mouth daily. Swallow whole.   docusate sodium (COLACE) 100 MG capsule Take 1 capsule (100 mg total) by mouth daily.   ELIQUIS 2.5 MG TABS tablet TAKE (1) TABLET TWICE A DAY. (Patient taking differently: Take 2.5 mg by mouth 2 (two) times daily.)   feeding supplement (ENSURE ENLIVE / ENSURE PLUS) LIQD Take 237 mLs by mouth 2 (two) times daily between meals.   gabapentin (NEURONTIN) 100 MG capsule Take 1 capsule (100 mg  total) by mouth at bedtime.   isosorbide mononitrate (IMDUR) 60 MG 24 hr tablet Take 1 tablet (60 mg total) by mouth daily.   levothyroxine (SYNTHROID) 112 MCG tablet Take 112 mcg by mouth daily.   MELATONIN PO Take 10 mg by mouth at bedtime.   metoprolol succinate (TOPROL-XL) 25 MG 24 hr tablet Take 0.5 tablets (12.5 mg total) by mouth daily. Take with or immediately following a meal. (Patient taking differently: Take 12.5 mg by mouth 2 (two) times daily. Take with or immediately following a meal.)   Multiple Vitamins-Minerals (CENTRUM SILVER ULTRA WOMENS PO) Take 1 tablet by mouth every morning.   nitroGLYCERIN (NITROSTAT) 0.4 MG SL tablet PLACE 1 TABLET UNDER THE TONGUE AT ONSET OF CHEST PAIN EVERY 5 MINTUES UP TO 3 TIMES AS NEEDED   pantoprazole (PROTONIX) 40 MG tablet Take 1 tablet (40 mg total) by mouth 2 (two) times daily before a meal.   polyethylene glycol (MIRALAX / GLYCOLAX) 17 g packet Take 17 g by mouth daily as needed for mild constipation.   rosuvastatin (CRESTOR) 10 MG tablet TAKE 1 TABLET DAILY   simethicone (MYLICON) 125 MG chewable tablet Chew 125 mg by mouth every 6 (six) hours as needed for flatulence.   No current facility-administered medications for this visit. (Other)   REVIEW OF SYSTEMS:    ALLERGIES Allergies  Allergen Reactions   Sotalol Other (See Comments)    Torsades    Tramadol Other (  See Comments)    Hallucination, bad-dream   Actos [Pioglitazone] Swelling   Adhesive [Tape] Other (See Comments)    Causes sores   Atorvastatin Other (See Comments)    myalgia   Bactrim [Sulfamethoxazole-Trimethoprim] Itching and Rash    Itchy rash   Ciprofloxacin Nausea Only   Contrast Media [Iodinated Contrast Media] Other (See Comments)    Headache (no action/pre-med required)   Latex Rash   Pedi-Pre Tape Spray [Wound Dressing Adhesive] Other (See Comments)    Causes sores   Sulfa Antibiotics Other (See Comments)    Causes sores   PAST MEDICAL HISTORY Past  Medical History:  Diagnosis Date   AICD (automatic cardioverter/defibrillator) present    Allergy    SESONAL   Aneurysm of infrarenal abdominal aorta (HCC)    Anxiety    ARTHRITIS    Arthritis    Atrial fibrillation (HCC)    CAD (coronary artery disease)    a. history of cardiac arrest 1993;  b. s/p LAD/LCX stenting in 2003;  c. 07/2011 Cath: LM nl, LAD patent stent, D1 80ost, LCX patent stent, RCA min irregs;  d. 04/2012 MV: EF 66%, no ishcemia;  e. 12/2013 Echo: Ef 55%, no rwma, Gr 1 DD, triv AI/MR, mildly dil LA;  f. 12/2013 Lexi MV: intermediate risk - apical ischemia and inf/infsept fixed defect, ? artifactual.   CAD in native artery 12/31/2013   Previous stents to LAD and Ramus patent on cath today 12/31/13 also There is severe disease in the ostial first diagonal which is unchanged from most recent cardiac catheterization. The right coronary artery could not be engaged selectively but nonselective angiography showed no significant disease in the proximal and midsegment.       Cataract    DENIES   Chronic back pain    Chronic sinus bradycardia    CKD (chronic kidney disease), stage III (HCC)    Clotting disorder (HCC)    DVT   COLITIS 12/02/2007   Qualifier: Diagnosis of  By: Koleen Distance CMA Duncan Dull), Hulan Saas     Diabetes mellitus without complication (HCC)    DENIES   Diverticulosis of colon (without mention of hemorrhage) 2007   Colonoscopy    Esophageal stricture    a. 2012 s/p dil.   Esophagitis, unspecified    a. 2012 EGD   EXTERNAL HEMORRHOIDS    GERD (gastroesophageal reflux disease)    omeprazole   H/O hiatal hernia    Hiatal hernia    a. 2012 EGD.   History of DVT in the past, not on Coumadin now    left  leg   HYPERCHOLESTEROLEMIA    Hypertension    ICD (implantable cardiac defibrillator) in place    a. s/p initial ICD in 1993 in setting of cardiac arrest;  b. 01/2007 gen change: Guidant T135 Vitality DS VR single lead ICD.   Macular degeneration    gets injecton in  eye every 5 weeks- last injection - 05/03/2013    Myocardial infarction [facility]) 1993   Near syncope 05/22/2015   Nephrolithiasis, just saw Dr Annabell Howells- "OK"    Orthostatic hypotension    Peripheral vascular disease (HCC)    ???   RECTAL BLEEDING 12/03/2007   Qualifier: Diagnosis of  By: Jarold Motto MD Mervyn Skeeters R    Unspecified gastritis and gastroduodenitis without mention of hemorrhage    a. 2003 EGD->not noted on 2012 EGD.   Unstable angina, neg MI, cath stable.maybe GI 12/30/2013   Past Surgical History:  Procedure Laterality Date  BREAST BIOPSY Left 11/2013   CARDIAC CATHETERIZATION     CARDIAC CATHETERIZATION  01/01/15   patent stents -there is severe disease in ostial 1st diag but without change.    CARDIAC DEFIBRILLATOR PLACEMENT  02/05/2007   Guidant   CARDIOVASCULAR STRESS TEST  12/30/2013   abnormal   CARDIOVERSION N/A 02/13/2019   Procedure: CARDIOVERSION;  Surgeon: Thurmon Fair, MD;  Location: [facility] ENDOSCOPY;  Service: Cardiovascular;  Laterality: N/A;   CATARACT EXTRACTION Right    Dr. Dione Booze   COLONOSCOPY     COLONOSCOPY WITH PROPOFOL N/A 03/30/2020   Procedure: COLONOSCOPY WITH PROPOFOL;  Surgeon: Dolores Frame, MD;  Location: [facility];  Service: Gastroenterology;  Laterality: N/A;   coronary stents      CYSTOSCOPY WITH URETEROSCOPY Right 05/20/2013   Procedure: CYSTOSCOPY, RIGHT URETEROSCOPY STONE EXTRACTION, Insertion of right DOUBLE J STENT ;  Surgeon: Bjorn Pippin, MD;  Location: [facility] ORS;  Service: Urology;  Laterality: Right;   CYSTOSCOPY WITH URETEROSCOPY AND STENT PLACEMENT N/A 06/03/2013   Procedure: SECOND LOOK CYSTOSCOPY WITH URETEROSCOPY  HOLMIUM LASER LITHO AND STONE EXTRACTION Newman Nip ;  Surgeon: Anner Crete, MD;  Location: [facility];  Service: Urology;  Laterality: N/A;   ESOPHAGOGASTRODUODENOSCOPY (EGD) WITH PROPOFOL N/A 11/20/2019   Procedure: ESOPHAGOGASTRODUODENOSCOPY (EGD) WITH PROPOFOL;  Surgeon: Corbin Ade, MD;  Location: [facility];   Service: Endoscopy;  Laterality: N/A;   GIVENS CAPSULE STUDY N/A 10/30/2016   Procedure: GIVENS CAPSULE STUDY;  Surgeon: Hilarie Fredrickson, MD;  Location: [facility] ENDOSCOPY;  Service: Endoscopy;  Laterality: N/A;  NEEDS TO BE ADMITTED FOR OBSERVATION PT. HAS DEFIB   HEMORROIDECTOMY  80's   HOLMIUM LASER APPLICATION Right 05/20/2013   Procedure: HOLMIUM LASER APPLICATION;  Surgeon: Bjorn Pippin, MD;  Location: [facility];  Service: Urology;  Laterality: Right;   HYSTEROSCOPY WITH D & C  09/02/2010   Procedure: DILATATION AND CURETTAGE (D&C) /HYSTEROSCOPY;  Surgeon: Meriel Pica;  Location: [facility] ORS;  Service: Gynecology;  Laterality: N/A;  Dilation and Curettage with Hysteroscopy and Polypectomy   IMPLANTABLE CARDIOVERTER DEFIBRILLATOR (ICD) GENERATOR CHANGE N/A 02/10/2014   Procedure: ICD GENERATOR CHANGE;  Surgeon: Thurmon Fair, MD;  Location: [facility] CATH LAB;  Service: Cardiovascular;  Laterality: N/A;   INTRAMEDULLARY (IM) NAIL INTERTROCHANTERIC Left 09/09/2022   Procedure: INTRAMEDULLARY (IM) NAIL INTERTROCHANTERIC;  Surgeon: Durene Romans, MD;  Location: [facility];  Service: Orthopedics;  Laterality: Left;   LEFT HEART CATHETERIZATION WITH CORONARY ANGIOGRAM N/A 07/28/2011   Procedure: LEFT HEART CATHETERIZATION WITH CORONARY ANGIOGRAM;  Surgeon: Runell Gess, MD;  Location: [facility] CATH LAB;  Service: Cardiovascular;  Laterality: N/A;   LEFT HEART CATHETERIZATION WITH CORONARY ANGIOGRAM N/A 12/31/2013   Procedure: LEFT HEART CATHETERIZATION WITH CORONARY ANGIOGRAM;  Surgeon: Iran Ouch, MD;  Location: [facility] CATH LAB;  Service: Cardiovascular;  Laterality: N/A;   FAMILY HISTORY Family History  Problem Relation Age of Onset   Stroke Mother    Other Mother        brain tumor   Other Father        MI   Heart attack Father    Stroke Sister    Macular degeneration Sister    Diabetes Daughter    Cancer Daughter        ovarian   Colon polyps Daughter    Atrial fibrillation Sister    Hyperlipidemia Sister     Osteoporosis Sister    Stroke Sister    Uterine cancer Sister  Heart attack Brother    Heart disease Brother    Asthma Brother    Breast cancer Neg Hx    SOCIAL HISTORY Social History   Tobacco Use   Smoking status: Never   Smokeless tobacco: Never  Vaping Use   Vaping status: Never Used  Substance Use Topics   Alcohol use: No   Drug use: No       OPHTHALMIC EXAM:  Not recorded    IMAGING AND PROCEDURES  Imaging and Procedures for 03/07/2023           ASSESSMENT/PLAN:    ICD-10-CM   1. Exudative age-related macular degeneration of right eye with active choroidal neovascularization (HCC)  H35.3211     2. Advanced atrophic nonexudative age-related macular degeneration of left eye with subfoveal involvement  H35.3124     3. Essential hypertension  I10     4. Hypertensive retinopathy of both eyes  H35.033     5. Pseudophakia, both eyes  Z96.1         Exudative age related macular degeneration, right eye - delayed follow up from 8 weeks to 5 months (July 18, 2022 to December 25, 2022) due to hip fracture, sx and rehab - previously managed by Dr. Luciana Axe - last visit and injection was 09.07.24 (IVA OD) -- pt was on a q6-8 wk maintenance schedule -- presented at 4+ mos since last IVA OD w/ [facility] - s/p IVA OD #1 (01.30.24), #2 (03.12.24), #3 (05.07.24), #4 (07.02.24), #5 (12.09.24), #6 (01.14.25)  - BCVA OD improved to 20/100 from 20/150   - OCT OD: [facility] with [facility] PED / [facility] -- PED/SRHM slightly increased nasal fovea, PED temporal macula slightly improved, trace cystic changes temp fovea -- stably improved at 5 weeks  - exam shows increase in [facility] temporal macula - recommend IVA OD #7 today, 02.19.25  with follow up back to 4 wks  - pt wishes to proceed with injection  - RBA of procedure discussed, questions answered - IVA informed consent obtained and signed 01.30.24 - see procedure note  - f/u in 4 wks, DFE, OCT, possible injxn  2. Age  related macular degeneration, non-exudative, left eye  - advanced stage with large central area of GA  - BCVA decreased to 20/100 from 20/80  - The incidence, anatomy, and pathology of dry AMD, risk of progression, and the AREDS and AREDS 2 study including smoking risks discussed with patient.  - Recommend amsler grid monitoring  3,4. Hypertensive retinopathy OU - discussed importance of tight BP control - continue to monitor  5. Pseudophakia OU  - s/p CE/IOL OU (Dr. Zetta Bills)  - IOLs in good position, doing well  - continue to monitor  Ophthalmic Meds Ordered this visit:  No orders of the defined types were placed in this encounter.    No follow-ups on file.  There are no Patient Instructions on file for this visit.  This document serves as a record of services personally performed by Karie Chimera, MD, PhD. It was created on their behalf by Glee Arvin. Manson Passey, OA an ophthalmic technician. The creation of this record is the provider's dictation and/or activities during the visit.    Electronically signed by: Glee Arvin. Manson Passey, OA 02/28/23 12:11 PM     Abbreviations: M myopia (nearsighted); A astigmatism; H hyperopia (farsighted); P presbyopia; Mrx spectacle prescription;  CTL contact lenses; OD right eye; OS left eye; OU both eyes  XT exotropia; ET esotropia; PEK punctate epithelial keratitis;  PEE punctate epithelial erosions; DES dry eye syndrome; MGD meibomian gland dysfunction; ATs artificial tears; PFAT's preservative free artificial tears; NSC nuclear sclerotic cataract; PSC posterior subcapsular cataract; ERM epi-retinal membrane; PVD posterior vitreous detachment; RD retinal detachment; DM diabetes mellitus; DR diabetic retinopathy; NPDR non-proliferative diabetic retinopathy; PDR proliferative diabetic retinopathy; CSME clinically significant macular edema; DME diabetic macular edema; dbh dot blot hemorrhages; CWS cotton wool spot; POAG primary open angle glaucoma; C/D cup-to-disc  ratio; HVF humphrey visual field; GVF goldmann visual field; OCT optical coherence tomography; IOP intraocular pressure; BRVO Branch retinal vein occlusion; CRVO central retinal vein occlusion; CRAO central retinal artery occlusion; BRAO branch retinal artery occlusion; RT retinal tear; SB scleral buckle; PPV pars plana vitrectomy; VH Vitreous hemorrhage; PRP panretinal laser photocoagulation; IVK intravitreal kenalog; VMT vitreomacular traction; MH Macular hole;  NVD neovascularization of the disc; NVE neovascularization elsewhere; AREDS age related eye disease study; ARMD age related macular degeneration; POAG primary open angle glaucoma; EBMD epithelial/anterior basement membrane dystrophy; ACIOL anterior chamber intraocular lens; IOL intraocular lens; PCIOL posterior chamber intraocular lens; Phaco/IOL phacoemulsification with intraocular lens placement; PRK photorefractive keratectomy; LASIK laser assisted in situ keratomileusis; HTN hypertension; DM diabetes mellitus; COPD chronic obstructive pulmonary disease

## 2023-02-28 NOTE — Progress Notes (Shared)
[facility] - Clinic Note  03/02/2023     CHIEF COMPLAINT Patient presents for No chief complaint on file.   HISTORY OF PRESENT ILLNESS: [name] is a 89 y.o. female who presents to the clinic today for:     Referring physician: No referring provider defined for this encounter.  HISTORICAL INFORMATION:   Selected notes from the MEDICAL RECORD NUMBER [name] -- transferring care due to insurance LEE: 09.07.23 -- BCVA CF OU Ocular Hx- ex ARMD OD -- s/p multiple IVA, last IVA OD 09.07.24 (4+ mos) PMH-    CURRENT MEDICATIONS: Current Outpatient Medications (Ophthalmic Drugs)  Medication Sig   carboxymethylcellulose (REFRESH PLUS) 0.5 % SOLN Place 1 drop into both eyes 3 (three) times daily as needed (dry/irritated eyes.).   latanoprost (XALATAN) 0.005 % ophthalmic solution Place 1 drop into the left eye at bedtime.   No current facility-administered medications for this visit. (Ophthalmic Drugs)   Current Outpatient Medications (Other)  Medication Sig   acetaminophen (TYLENOL) 500 MG tablet Take 1 tablet (500 mg total) by mouth every 6 (six) hours as needed for mild pain, fever or headache.   Alpha-Lipoic Acid 600 MG TABS Take 600 mg by mouth daily. For neuropathy in legs (Patient taking differently: Take 100 mg by mouth daily.)   amiodarone (PACERONE) 200 MG tablet Take 1 tablet (200 mg total) by mouth daily.   amLODipine (NORVASC) 5 MG tablet Take 5 mg by mouth daily.   aspirin EC 81 MG EC tablet Take 1 tablet (81 mg total) by mouth daily. Swallow whole.   docusate sodium (COLACE) 100 MG capsule Take 1 capsule (100 mg total) by mouth daily.   ELIQUIS 2.5 MG TABS tablet TAKE (1) TABLET TWICE A DAY. (Patient taking differently: Take 2.5 mg by mouth 2 (two) times daily.)   feeding supplement (ENSURE ENLIVE / ENSURE PLUS) LIQD Take 237 mLs by mouth 2 (two) times daily between meals.   gabapentin (NEURONTIN) 100 MG capsule Take 1 capsule (100 mg  total) by mouth at bedtime.   isosorbide mononitrate (IMDUR) 60 MG 24 hr tablet Take 1 tablet (60 mg total) by mouth daily.   levothyroxine (SYNTHROID) 112 MCG tablet Take 112 mcg by mouth daily.   MELATONIN PO Take 10 mg by mouth at bedtime.   metoprolol succinate (TOPROL-XL) 25 MG 24 hr tablet Take 0.5 tablets (12.5 mg total) by mouth daily. Take with or immediately following a meal. (Patient taking differently: Take 12.5 mg by mouth 2 (two) times daily. Take with or immediately following a meal.)   Multiple Vitamins-Minerals (CENTRUM SILVER ULTRA WOMENS PO) Take 1 tablet by mouth every morning.   nitroGLYCERIN (NITROSTAT) 0.4 MG SL tablet PLACE 1 TABLET UNDER THE TONGUE AT ONSET OF CHEST PAIN EVERY 5 MINTUES UP TO 3 TIMES AS NEEDED   pantoprazole (PROTONIX) 40 MG tablet Take 1 tablet (40 mg total) by mouth 2 (two) times daily before a meal.   polyethylene glycol (MIRALAX / GLYCOLAX) 17 g packet Take 17 g by mouth daily as needed for mild constipation.   rosuvastatin (CRESTOR) 10 MG tablet TAKE 1 TABLET DAILY   simethicone (MYLICON) 125 MG chewable tablet Chew 125 mg by mouth every 6 (six) hours as needed for flatulence.   No current facility-administered medications for this visit. (Other)   REVIEW OF SYSTEMS:    ALLERGIES Allergies  Allergen Reactions   Sotalol Other (See Comments)    Torsades    Tramadol Other (  See Comments)    Hallucination, bad-dream   Actos [Pioglitazone] Swelling   Adhesive [Tape] Other (See Comments)    Causes sores   Atorvastatin Other (See Comments)    myalgia   Bactrim [Sulfamethoxazole-Trimethoprim] Itching and Rash    Itchy rash   Ciprofloxacin Nausea Only   Contrast Media [Iodinated Contrast Media] Other (See Comments)    Headache (no action/pre-med required)   Latex Rash   Pedi-Pre Tape Spray [Wound Dressing Adhesive] Other (See Comments)    Causes sores   Sulfa Antibiotics Other (See Comments)    Causes sores   PAST MEDICAL HISTORY Past  Medical History:  Diagnosis Date   AICD (automatic cardioverter/defibrillator) present    Allergy    SESONAL   Aneurysm of infrarenal abdominal aorta (HCC)    Anxiety    ARTHRITIS    Arthritis    Atrial fibrillation (HCC)    CAD (coronary artery disease)    a. history of cardiac arrest 1993;  b. s/p LAD/LCX stenting in 2003;  c. 07/2011 Cath: LM nl, LAD patent stent, D1 80ost, LCX patent stent, RCA min irregs;  d. 04/2012 MV: EF 66%, no ishcemia;  e. 12/2013 Echo: Ef 55%, no rwma, Gr 1 DD, triv AI/MR, mildly dil LA;  f. 12/2013 Lexi MV: intermediate risk - apical ischemia and inf/infsept fixed defect, ? artifactual.   CAD in native artery 12/31/2013   Previous stents to LAD and Ramus patent on cath today 12/31/13 also There is severe disease in the ostial first diagonal which is unchanged from most recent cardiac catheterization. The right coronary artery could not be engaged selectively but nonselective angiography showed no significant disease in the proximal and midsegment.       Cataract    DENIES   Chronic back pain    Chronic sinus bradycardia    CKD (chronic kidney disease), stage III (HCC)    Clotting disorder (HCC)    DVT   COLITIS 12/02/2007   Qualifier: Diagnosis of  By: Koleen Distance CMA Duncan Dull), Hulan Saas     Diabetes mellitus without complication (HCC)    DENIES   Diverticulosis of colon (without mention of hemorrhage) 2007   Colonoscopy    Esophageal stricture    a. 2012 s/p dil.   Esophagitis, unspecified    a. 2012 EGD   EXTERNAL HEMORRHOIDS    GERD (gastroesophageal reflux disease)    omeprazole   H/O hiatal hernia    Hiatal hernia    a. 2012 EGD.   History of DVT in the past, not on Coumadin now    left  leg   HYPERCHOLESTEROLEMIA    Hypertension    ICD (implantable cardiac defibrillator) in place    a. s/p initial ICD in 1993 in setting of cardiac arrest;  b. 01/2007 gen change: Guidant T135 Vitality DS VR single lead ICD.   Macular degeneration    gets injecton in  eye every 5 weeks- last injection - 05/03/2013    Myocardial infarction [facility]) 1993   Near syncope 05/22/2015   Nephrolithiasis, just saw Dr Annabell Howells- "OK"    Orthostatic hypotension    Peripheral vascular disease (HCC)    ???   RECTAL BLEEDING 12/03/2007   Qualifier: Diagnosis of  By: Jarold Motto MD Mervyn Skeeters R    Unspecified gastritis and gastroduodenitis without mention of hemorrhage    a. 2003 EGD->not noted on 2012 EGD.   Unstable angina, neg MI, cath stable.maybe GI 12/30/2013   Past Surgical History:  Procedure Laterality Date  BREAST BIOPSY Left 11/2013   CARDIAC CATHETERIZATION     CARDIAC CATHETERIZATION  01/01/15   patent stents -there is severe disease in ostial 1st diag but without change.    CARDIAC DEFIBRILLATOR PLACEMENT  02/05/2007   Guidant   CARDIOVASCULAR STRESS TEST  12/30/2013   abnormal   CARDIOVERSION N/A 02/13/2019   Procedure: CARDIOVERSION;  Surgeon: Thurmon Fair, MD;  Location: [facility] ENDOSCOPY;  Service: Cardiovascular;  Laterality: N/A;   CATARACT EXTRACTION Right    Dr. Dione Booze   COLONOSCOPY     COLONOSCOPY WITH PROPOFOL N/A 03/30/2020   Procedure: COLONOSCOPY WITH PROPOFOL;  Surgeon: Dolores Frame, MD;  Location: [facility];  Service: Gastroenterology;  Laterality: N/A;   coronary stents      CYSTOSCOPY WITH URETEROSCOPY Right 05/20/2013   Procedure: CYSTOSCOPY, RIGHT URETEROSCOPY STONE EXTRACTION, Insertion of right DOUBLE J STENT ;  Surgeon: Bjorn Pippin, MD;  Location: [facility] ORS;  Service: Urology;  Laterality: Right;   CYSTOSCOPY WITH URETEROSCOPY AND STENT PLACEMENT N/A 06/03/2013   Procedure: SECOND LOOK CYSTOSCOPY WITH URETEROSCOPY  HOLMIUM LASER LITHO AND STONE EXTRACTION Newman Nip ;  Surgeon: Anner Crete, MD;  Location: [facility];  Service: Urology;  Laterality: N/A;   ESOPHAGOGASTRODUODENOSCOPY (EGD) WITH PROPOFOL N/A 11/20/2019   Procedure: ESOPHAGOGASTRODUODENOSCOPY (EGD) WITH PROPOFOL;  Surgeon: Corbin Ade, MD;  Location: [facility];   Service: Endoscopy;  Laterality: N/A;   GIVENS CAPSULE STUDY N/A 10/30/2016   Procedure: GIVENS CAPSULE STUDY;  Surgeon: Hilarie Fredrickson, MD;  Location: [facility] ENDOSCOPY;  Service: Endoscopy;  Laterality: N/A;  NEEDS TO BE ADMITTED FOR OBSERVATION PT. HAS DEFIB   HEMORROIDECTOMY  80's   HOLMIUM LASER APPLICATION Right 05/20/2013   Procedure: HOLMIUM LASER APPLICATION;  Surgeon: Bjorn Pippin, MD;  Location: [facility];  Service: Urology;  Laterality: Right;   HYSTEROSCOPY WITH D & C  09/02/2010   Procedure: DILATATION AND CURETTAGE (D&C) /HYSTEROSCOPY;  Surgeon: Meriel Pica;  Location: [facility] OR[facility];  Service: Gynecology;  Laterality: N/A;  Dilation and Curettage with Hysteroscopy and Polypectomy   IMPLANTABLE CARDIOVERTER DEFIBRILLATOR (ICD) GENERATOR CHANGE N/A 02/10/2014   Procedure: ICD GENERATOR CHANGE;  Surgeon: Thurmon Fair, MD;  Location: [facility] CATH LAB;  Service: Cardiovascular;  Laterality: N/A;   INTRAMEDULLARY (IM) NAIL INTERTROCHANTERIC Left 09/09/2022   Procedure: INTRAMEDULLARY (IM) NAIL INTERTROCHANTERIC;  Surgeon: Durene Romans, MD;  Location: [facility];  Service: Orthopedics;  Laterality: Left;   LEFT HEART CATHETERIZATION WITH CORONARY ANGIOGRAM N/A 07/28/2011   Procedure: LEFT HEART CATHETERIZATION WITH CORONARY ANGIOGRAM;  Surgeon: Runell Gess, MD;  Location: [facility] CATH LAB;  Service: Cardiovascular;  Laterality: N/A;   LEFT HEART CATHETERIZATION WITH CORONARY ANGIOGRAM N/A 12/31/2013   Procedure: LEFT HEART CATHETERIZATION WITH CORONARY ANGIOGRAM;  Surgeon: Iran Ouch, MD;  Location: [facility] CATH LAB;  Service: Cardiovascular;  Laterality: N/A;   FAMILY HISTORY Family History  Problem Relation Age of Onset   Stroke Mother    Other Mother        brain tumor   Other Father        MI   Heart attack Father    Stroke Sister    Macular degeneration Sister    Diabetes Daughter    Cancer Daughter        ovarian   Colon polyps Daughter    Atrial fibrillation Sister    Hyperlipidemia Sister     Osteoporosis Sister    Stroke Sister    Uterine cancer Sister  Heart attack Brother    Heart disease Brother    Asthma Brother    Breast cancer Neg Hx    SOCIAL HISTORY Social History   Tobacco Use   Smoking status: Never   Smokeless tobacco: Never  Vaping Use   Vaping status: Never Used  Substance Use Topics   Alcohol use: No   Drug use: No       OPHTHALMIC EXAM:  Not recorded    IMAGING AND PROCEDURES  Imaging and Procedures for 03/02/2023           ASSESSMENT/PLAN:  No diagnosis found.    Exudative age related macular degeneration, right eye - delayed follow up from 8 weeks to 5 months (July 18, 2022 to December 25, 2022) due to hip fracture, sx and rehab - previously managed by Dr. Luciana Axe - last visit and injection was 09.07.24 (IVA OD) -- pt was on a q6-8 wk maintenance schedule -- presented at 4+ mos since last IVA OD w/ [facility] - s/p IVA OD #1 (01.30.24), #2 (03.12.24), #3 (05.07.24), #4 (07.02.24), #5 (12.09.24), #6 (01.14.25)  - BCVA OD improved to 20/100 from 20/150   - OCT OD: [facility] with scattered PED / [facility] -- PED/SRHM slightly increased nasal fovea, PED temporal macula slightly improved, trace cystic changes temp fovea -- stably improved at 5 weeks  - exam shows increase in [facility] temporal macula - recommend IVA OD #7 today, 02.14.25  with follow up back to 4 wks  - pt wishes to proceed with injection  - RBA of procedure discussed, questions answered - IVA informed consent obtained and signed 01.30.24 - see procedure note  - f/u in 4 wks, DFE, OCT, possible injxn  2. Age related macular degeneration, non-exudative, left eye  - advanced stage with large central area of GA  - BCVA decreased to 20/100 from 20/80  - The incidence, anatomy, and pathology of dry AMD, risk of progression, and the AREDS and AREDS 2 study including smoking risks discussed with patient.  - Recommend amsler grid monitoring  3,4. Hypertensive retinopathy OU -  discussed importance of tight BP control - continue to monitor  5. Pseudophakia OU  - s/p CE/IOL OU (Dr. Zetta Bills)  - IOLs in good position, doing well  - continue to monitor  Ophthalmic Meds Ordered this visit:  No orders of the defined types were placed in this encounter.    No follow-ups on file.  There are no Patient Instructions on file for this visit. This document serves as a record of services personally performed by Karie Chimera, MD, PhD. It was created on their behalf by Berlin Hun COT, an ophthalmic technician. The creation of this record is the provider's dictation and/or activities during the visit.    Electronically signed by: Berlin Hun COT 02.12.25 10:40 AM    Abbreviations: M myopia (nearsighted); A astigmatism; H hyperopia (farsighted); P presbyopia; Mrx spectacle prescription;  CTL contact lenses; OD right eye; OS left eye; OU both eyes  XT exotropia; ET esotropia; PEK punctate epithelial keratitis; PEE punctate epithelial erosions; DES dry eye syndrome; MGD meibomian gland dysfunction; ATs artificial tears; PFAT's preservative free artificial tears; NSC nuclear sclerotic cataract; PSC posterior subcapsular cataract; ERM epi-retinal membrane; PVD posterior vitreous detachment; RD retinal detachment; DM diabetes mellitus; DR diabetic retinopathy; NPDR non-proliferative diabetic retinopathy; PDR proliferative diabetic retinopathy; CSME clinically significant macular edema; DME diabetic macular edema; dbh dot blot hemorrhages; CWS cotton wool spot; POAG primary open angle glaucoma; C/D cup-to-disc  ratio; HVF humphrey visual field; GVF goldmann visual field; OCT optical coherence tomography; IOP intraocular pressure; BRVO Branch retinal vein occlusion; CRVO central retinal vein occlusion; CRAO central retinal artery occlusion; BRAO branch retinal artery occlusion; RT retinal tear; SB scleral buckle; PPV pars plana vitrectomy; VH Vitreous hemorrhage; PRP  panretinal laser photocoagulation; IVK intravitreal kenalog; VMT vitreomacular traction; MH Macular hole;  NVD neovascularization of the disc; NVE neovascularization elsewhere; AREDS age related eye disease study; ARMD age related macular degeneration; POAG primary open angle glaucoma; EBMD epithelial/anterior basement membrane dystrophy; ACIOL anterior chamber intraocular lens; IOL intraocular lens; PCIOL posterior chamber intraocular lens; Phaco/IOL phacoemulsification with intraocular lens placement; PRK photorefractive keratectomy; LASIK laser assisted in situ keratomileusis; HTN hypertension; DM diabetes mellitus; COPD chronic obstructive pulmonary disease

## 2023-03-01 ENCOUNTER — Ambulatory Visit (INDEPENDENT_AMBULATORY_CARE_PROVIDER_SITE_OTHER): Payer: Medicare (Managed Care)

## 2023-03-01 DIAGNOSIS — I480 Paroxysmal atrial fibrillation: Secondary | ICD-10-CM

## 2023-03-02 ENCOUNTER — Encounter (INDEPENDENT_AMBULATORY_CARE_PROVIDER_SITE_OTHER): Payer: Medicare (Managed Care) | Admitting: Ophthalmology

## 2023-03-07 ENCOUNTER — Encounter (INDEPENDENT_AMBULATORY_CARE_PROVIDER_SITE_OTHER): Payer: Medicare (Managed Care) | Admitting: Ophthalmology

## 2023-03-07 DIAGNOSIS — Z961 Presence of intraocular lens: Secondary | ICD-10-CM

## 2023-03-07 DIAGNOSIS — H353124 Nonexudative age-related macular degeneration, left eye, advanced atrophic with subfoveal involvement: Secondary | ICD-10-CM

## 2023-03-07 DIAGNOSIS — H35033 Hypertensive retinopathy, bilateral: Secondary | ICD-10-CM

## 2023-03-07 DIAGNOSIS — I10 Essential (primary) hypertension: Secondary | ICD-10-CM

## 2023-03-07 DIAGNOSIS — H353211 Exudative age-related macular degeneration, right eye, with active choroidal neovascularization: Secondary | ICD-10-CM

## 2023-03-07 LAB — CUP PACEART REMOTE DEVICE CHECK
Battery Remaining Longevity: 96 mo
Battery Remaining Percentage: 86 %
Brady Statistic RV Percent Paced: 0 %
Date Time Interrogation Session: 20250219005400
HighPow Impedance: 35 Ohm
Implantable Lead Connection Status: 753985
Implantable Lead Implant Date: 20090120
Implantable Lead Location: 753860
Implantable Lead Model: 157
Implantable Lead Serial Number: 138237
Implantable Pulse Generator Implant Date: 20160126
Lead Channel Impedance Value: 447 Ohm
Lead Channel Pacing Threshold Amplitude: 1.2 V
Lead Channel Pacing Threshold Pulse Width: 0.5 ms
Lead Channel Setting Pacing Amplitude: 2.2 V
Lead Channel Setting Pacing Pulse Width: 0.5 ms
Lead Channel Setting Sensing Sensitivity: 0.6 mV
Pulse Gen Serial Number: 191956

## 2023-03-08 ENCOUNTER — Encounter: Payer: Self-pay | Admitting: Cardiovascular Disease

## 2023-03-08 ENCOUNTER — Ambulatory Visit: Payer: Medicare (Managed Care) | Admitting: Gastroenterology

## 2023-03-08 NOTE — Progress Notes (Addendum)
 [facility] - Clinic Note  03/12/2023     CHIEF COMPLAINT Patient presents for Retina Follow Up   HISTORY OF PRESENT ILLNESS: [name] is a 89 y.o. female who presents to the clinic today for:   HPI     Retina Follow Up   Patient presents with  Wet AMD.  In right eye.  This started 5 weeks ago.  Duration of 5 weeks.  Since onset it is gradually worsening.  I, the attending physician,  performed the HPI with the patient and updated documentation appropriately.        Comments   Patient states that the vision is not any better but maybe a little worse. She is using Latanoprost OS at bedtime.       Last edited by Rennis Chris, MD on 03/12/2023  5:30 PM.    Pt is delayed to follow up from 4 weeks to 5.9 weeks due to the snow last week, she feels like her vision might be a little worse  Referring physician: No referring provider defined for this encounter.  HISTORICAL INFORMATION:   Selected notes from the MEDICAL RECORD NUMBER [name] -- transferring care due to insurance LEE: 09.07.23 -- BCVA CF OU Ocular Hx- ex ARMD OD -- s/p multiple IVA, last IVA OD 09.07.24 (4+ mos) PMH-    CURRENT MEDICATIONS: Current Outpatient Medications (Ophthalmic Drugs)  Medication Sig   carboxymethylcellulose (REFRESH PLUS) 0.5 % SOLN Place 1 drop into both eyes 3 (three) times daily as needed (dry/irritated eyes.).   latanoprost (XALATAN) 0.005 % ophthalmic solution Place 1 drop into the left eye at bedtime.   No current facility-administered medications for this visit. (Ophthalmic Drugs)   Current Outpatient Medications (Other)  Medication Sig   acetaminophen (TYLENOL) 500 MG tablet Take 1 tablet (500 mg total) by mouth every 6 (six) hours as needed for mild pain, fever or headache.   Alpha-Lipoic Acid 600 MG TABS Take 600 mg by mouth daily. For neuropathy in legs (Patient taking differently: Take 100 mg by mouth daily.)   amiodarone (PACERONE) 200 MG  tablet Take 1 tablet (200 mg total) by mouth daily.   amLODipine (NORVASC) 5 MG tablet Take 5 mg by mouth daily.   aspirin EC 81 MG EC tablet Take 1 tablet (81 mg total) by mouth daily. Swallow whole.   docusate sodium (COLACE) 100 MG capsule Take 1 capsule (100 mg total) by mouth daily.   ELIQUIS 2.5 MG TABS tablet TAKE (1) TABLET TWICE A DAY. (Patient taking differently: Take 2.5 mg by mouth 2 (two) times daily.)   feeding supplement (ENSURE ENLIVE / ENSURE PLUS) LIQD Take 237 mLs by mouth 2 (two) times daily between meals.   gabapentin (NEURONTIN) 100 MG capsule Take 1 capsule (100 mg total) by mouth at bedtime.   isosorbide mononitrate (IMDUR) 60 MG 24 hr tablet Take 1 tablet (60 mg total) by mouth daily.   levothyroxine (SYNTHROID) 112 MCG tablet Take 112 mcg by mouth daily.   MELATONIN PO Take 10 mg by mouth at bedtime.   Multiple Vitamins-Minerals (CENTRUM SILVER ULTRA WOMENS PO) Take 1 tablet by mouth every morning.   nitroGLYCERIN (NITROSTAT) 0.4 MG SL tablet PLACE 1 TABLET UNDER THE TONGUE AT ONSET OF CHEST PAIN EVERY 5 MINTUES UP TO 3 TIMES AS NEEDED   pantoprazole (PROTONIX) 40 MG tablet Take 1 tablet (40 mg total) by mouth 2 (two) times daily before a meal.   polyethylene glycol (MIRALAX /  GLYCOLAX) 17 g packet Take 17 g by mouth daily as needed for mild constipation.   rosuvastatin (CRESTOR) 10 MG tablet TAKE 1 TABLET DAILY   simethicone (MYLICON) 125 MG chewable tablet Chew 125 mg by mouth every 6 (six) hours as needed for flatulence.   metoprolol succinate (TOPROL-XL) 25 MG 24 hr tablet Take 0.5 tablets (12.5 mg total) by mouth daily. Take with or immediately following a meal. (Patient taking differently: Take 12.5 mg by mouth 2 (two) times daily. Take with or immediately following a meal.)   No current facility-administered medications for this visit. (Other)   REVIEW OF SYSTEMS: ROS   Positive for: Cardiovascular, Eyes Negative for: Constitutional, Gastrointestinal,  Neurological, Skin, Genitourinary, Musculoskeletal, HENT, Endocrine, Respiratory, Psychiatric, Allergic/Imm, Heme/Lymph Last edited by Charlette Caffey, COT on 03/12/2023  2:13 PM.       ALLERGIES Allergies  Allergen Reactions   Sotalol Other (See Comments)    Torsades    Tramadol Other (See Comments)    Hallucination, bad-dream   Actos [Pioglitazone] Swelling   Adhesive [Tape] Other (See Comments)    Causes sores   Atorvastatin Other (See Comments)    myalgia   Bactrim [Sulfamethoxazole-Trimethoprim] Itching and Rash    Itchy rash   Ciprofloxacin Nausea Only   Contrast Media [Iodinated Contrast Media] Other (See Comments)    Headache (no action/pre-med required)   Latex Rash   Pedi-Pre Tape Spray [Wound Dressing Adhesive] Other (See Comments)    Causes sores   Sulfa Antibiotics Other (See Comments)    Causes sores   PAST MEDICAL HISTORY Past Medical History:  Diagnosis Date   AICD (automatic cardioverter/defibrillator) present    Allergy    SESONAL   Aneurysm of infrarenal abdominal aorta (HCC)    Anxiety    ARTHRITIS    Arthritis    Atrial fibrillation (HCC)    CAD (coronary artery disease)    a. history of cardiac arrest 1993;  b. s/p LAD/LCX stenting in 2003;  c. 07/2011 Cath: LM nl, LAD patent stent, D1 80ost, LCX patent stent, RCA min irregs;  d. 04/2012 MV: EF 66%, no ishcemia;  e. 12/2013 Echo: Ef 55%, no rwma, Gr 1 DD, triv AI/MR, mildly dil LA;  f. 12/2013 Lexi MV: intermediate risk - apical ischemia and inf/infsept fixed defect, ? artifactual.   CAD in native artery 12/31/2013   Previous stents to LAD and Ramus patent on cath today 12/31/13 also There is severe disease in the ostial first diagonal which is unchanged from most recent cardiac catheterization. The right coronary artery could not be engaged selectively but nonselective angiography showed no significant disease in the proximal and midsegment.       Cataract    DENIES   Chronic back pain     Chronic sinus bradycardia    CKD (chronic kidney disease), stage III (HCC)    Clotting disorder (HCC)    DVT   COLITIS 12/02/2007   Qualifier: Diagnosis of  By: Koleen Distance CMA Duncan Dull), Hulan Saas     Diabetes mellitus without complication (HCC)    DENIES   Diverticulosis of colon (without mention of hemorrhage) 2007   Colonoscopy    Esophageal stricture    a. 2012 s/p dil.   Esophagitis, unspecified    a. 2012 EGD   EXTERNAL HEMORRHOIDS    GERD (gastroesophageal reflux disease)    omeprazole   H/O hiatal hernia    Hiatal hernia    a. 2012 EGD.   History of DVT in  the past, not on Coumadin now    left  leg   HYPERCHOLESTEROLEMIA    Hypertension    ICD (implantable cardiac defibrillator) in place    a. s/p initial ICD in 1993 in setting of cardiac arrest;  b. 01/2007 gen change: Guidant T135 Vitality DS VR single lead ICD.   Macular degeneration    gets injecton in eye every 5 weeks- last injection - 05/03/2013    Myocardial infarction [facility]) 1993   Near syncope 05/22/2015   Nephrolithiasis, just saw Dr Annabell Howells- "OK"    Orthostatic hypotension    Peripheral vascular disease (HCC)    ???   RECTAL BLEEDING 12/03/2007   Qualifier: Diagnosis of  By: Jarold Motto MD Mervyn Skeeters R    Unspecified gastritis and gastroduodenitis without mention of hemorrhage    a. 2003 EGD->not noted on 2012 EGD.   Unstable angina, neg MI, cath stable.maybe GI 12/30/2013   Past Surgical History:  Procedure Laterality Date   BREAST BIOPSY Left 11/2013   CARDIAC CATHETERIZATION     CARDIAC CATHETERIZATION  01/01/15   patent stents -there is severe disease in ostial 1st diag but without change.    CARDIAC DEFIBRILLATOR PLACEMENT  02/05/2007   Guidant   CARDIOVASCULAR STRESS TEST  12/30/2013   abnormal   CARDIOVERSION N/A 02/13/2019   Procedure: CARDIOVERSION;  Surgeon: Thurmon Fair, MD;  Location: [facility] ENDOSCOPY;  Service: Cardiovascular;  Laterality: N/A;   CATARACT EXTRACTION Right    Dr. Dione Booze   COLONOSCOPY      COLONOSCOPY WITH PROPOFOL N/A 03/30/2020   Procedure: COLONOSCOPY WITH PROPOFOL;  Surgeon: Dolores Frame, MD;  Location: [facility];  Service: Gastroenterology;  Laterality: N/A;   coronary stents      CYSTOSCOPY WITH URETEROSCOPY Right 05/20/2013   Procedure: CYSTOSCOPY, RIGHT URETEROSCOPY STONE EXTRACTION, Insertion of right DOUBLE J STENT ;  Surgeon: Bjorn Pippin, MD;  Location: [facility] ORS;  Service: Urology;  Laterality: Right;   CYSTOSCOPY WITH URETEROSCOPY AND STENT PLACEMENT N/A 06/03/2013   Procedure: SECOND LOOK CYSTOSCOPY WITH URETEROSCOPY  HOLMIUM LASER LITHO AND STONE EXTRACTION Newman Nip ;  Surgeon: Anner Crete, MD;  Location: [facility] ORS;  Service: Urology;  Laterality: N/A;   ESOPHAGOGASTRODUODENOSCOPY (EGD) WITH PROPOFOL N/A 11/20/2019   Procedure: ESOPHAGOGASTRODUODENOSCOPY (EGD) WITH PROPOFOL;  Surgeon: Corbin Ade, MD;  Location: [facility];  Service: Endoscopy;  Laterality: N/A;   GIVENS CAPSULE STUDY N/A 10/30/2016   Procedure: GIVENS CAPSULE STUDY;  Surgeon: Hilarie Fredrickson, MD;  Location: [facility] ENDOSCOPY;  Service: Endoscopy;  Laterality: N/A;  NEEDS TO BE ADMITTED FOR OBSERVATION PT. HAS DEFIB   HEMORROIDECTOMY  80's   HOLMIUM LASER APPLICATION Right 05/20/2013   Procedure: HOLMIUM LASER APPLICATION;  Surgeon: Bjorn Pippin, MD;  Location: [facility];  Service: Urology;  Laterality: Right;   HYSTEROSCOPY WITH D & C  09/02/2010   Procedure: DILATATION AND CURETTAGE (D&C) /HYSTEROSCOPY;  Surgeon: Meriel Pica;  Location: [facility] ORS;  Service: Gynecology;  Laterality: N/A;  Dilation and Curettage with Hysteroscopy and Polypectomy   IMPLANTABLE CARDIOVERTER DEFIBRILLATOR (ICD) GENERATOR CHANGE N/A 02/10/2014   Procedure: ICD GENERATOR CHANGE;  Surgeon: Thurmon Fair, MD;  Location: [facility] CATH LAB;  Service: Cardiovascular;  Laterality: N/A;   INTRAMEDULLARY (IM) NAIL INTERTROCHANTERIC Left 09/09/2022   Procedure: INTRAMEDULLARY (IM) NAIL INTERTROCHANTERIC;  Surgeon: Durene Romans, MD;   Location: [facility]S;  Service: Orthopedics;  Laterality: Left;   LEFT HEART CATHETERIZATION WITH CORONARY ANGIOGRAM N/A 07/28/2011   Procedure: LEFT HEART  CATHETERIZATION WITH CORONARY ANGIOGRAM;  Surgeon: Runell Gess, MD;  Location: [facility] CATH LAB;  Service: Cardiovascular;  Laterality: N/A;   LEFT HEART CATHETERIZATION WITH CORONARY ANGIOGRAM N/A 12/31/2013   Procedure: LEFT HEART CATHETERIZATION WITH CORONARY ANGIOGRAM;  Surgeon: Iran Ouch, MD;  Location: [facility] CATH LAB;  Service: Cardiovascular;  Laterality: N/A;   FAMILY HISTORY Family History  Problem Relation Age of Onset   Stroke Mother    Other Mother        brain tumor   Other Father        MI   Heart attack Father    Stroke Sister    Macular degeneration Sister    Diabetes Daughter    Cancer Daughter        ovarian   Colon polyps Daughter    Atrial fibrillation Sister    Hyperlipidemia Sister    Osteoporosis Sister    Stroke Sister    Uterine cancer Sister    Heart attack Brother    Heart disease Brother    Asthma Brother    Breast cancer Neg Hx    SOCIAL HISTORY Social History   Tobacco Use   Smoking status: Never   Smokeless tobacco: Never  Vaping Use   Vaping status: Never Used  Substance Use Topics   Alcohol use: No   Drug use: No       OPHTHALMIC EXAM:  Base Eye Exam     Visual Acuity (Snellen - Linear)       Right Left   Dist cc 20/150 20/150   Dist ph cc NI NI         Tonometry (Tonopen, 2:17 PM)       Right Left   Pressure 12 11         Pupils       Dark Light Shape React APD   Right 3 2 Round Slow None   Left 3 2 Round Slow None         Visual Fields       Left Right    Full Full         Extraocular Movement       Right Left    Full, Ortho Full, Ortho         Neuro/Psych     Oriented x3: Yes   Mood/Affect: Normal         Dilation     Both eyes: 1.0% Mydriacyl, 2.5% Phenylephrine @ 2:14 PM           Slit Lamp and Fundus Exam     Slit  Lamp Exam       Right Left   Lids/Lashes Dermatochalasis - upper lid Dermatochalasis - upper lid   Conjunctiva/Sclera White and quiet White and quiet   Cornea 1-2+ Punctate epithelial erosions, well healed cataract wound 2+ fine Punctate epithelial erosions, well healed cataract wound   Anterior Chamber deep and clear deep and clear   Iris Round and dilated Round and dilated   Lens PC IOL in good position PC IOL in good position   Anterior Vitreous mild syneresis, Posterior vitreous detachment, Vitreous condensations mild syneresis, silicone oil microbubbles, PVD         Fundus Exam       Right Left   Disc mild Pallor, Sharp rim, temporal PPA mild Pallor, Sharp rim, temporal PPA   C/D Ratio 0.1 0.1   Macula Blunted foveal reflex, central GA with +CNV and IRH centrally Drusen, RPE  mottling and clumping Blunted foveal reflex, large area of GA centrally, Drusen, RPE mottling, clumping and atrophy, no heme   Vessels attenuated, mild tortuosity attenuated, mild tortuosity   Periphery Attached, reticular degeneration, No heme Attached, reticular degeneration, No heme           IMAGING AND PROCEDURES  Imaging and Procedures for 03/12/2023  OCT, Retina - OU - Both Eyes       Right Eye Quality was good. Central Foveal Thickness: 322. Progression has improved. Findings include normal foveal contour, no IRF, no SRF, retinal drusen , subretinal hyper-reflective material, pigment epithelial detachment, outer retinal atrophy (Central ORA with scattered PED / [facility] -- PED/SRHM slightly improved nasal fovea, PED temporal macula -- stable).   Left Eye Quality was good. Central Foveal Thickness: 208. Progression has been stable. Findings include normal foveal contour, no IRF, no SRF, retinal drusen , subretinal hyper-reflective material, outer retinal atrophy (Central ORA with [facility]).   Notes *Images captured and stored on drive  Diagnosis / Impression:  OD: Exudative ARMD - Central ORA with  scattered PED / [facility] -- PED/SRHM slightly improved nasal fovea, PED temporal macula -- stable OS: Nonexudative ARMD - Central ORA with [facility]  Clinical management:  See below  Abbreviations: NFP - Normal foveal profile. CME - cystoid macular edema. PED - pigment epithelial detachment. IRF - intraretinal fluid. SRF - subretinal fluid. EZ - ellipsoid zone. ERM - epiretinal membrane. ORA - outer retinal atrophy. ORT - outer retinal tubulation. SRHM - subretinal hyper-reflective material. IRHM - intraretinal hyper-reflective material      Intravitreal Injection, Pharmacologic Agent - OD - Right Eye       Time Out 03/12/2023. 2:45 PM. Confirmed correct patient, procedure, site, and patient consented.   Anesthesia Topical anesthesia was used. Anesthetic medications included Lidocaine 2%, Proparacaine 0.5%.   Procedure Preparation included 5% betadine to ocular surface, eyelid speculum. A (32g) needle was used.   Injection: 1.25 mg Bevacizumab 1.25mg /0.42ml   Route: Intravitreal, Site: Right Eye   NDC: [id number], Lot: [id number], Expiration date: 04/05/2023   Post-op Post injection exam found visual acuity of at least counting fingers. The patient tolerated the procedure well. There were no complications. The patient received written and verbal post procedure care education. Post injection medications were not given.            ASSESSMENT/PLAN:    ICD-10-CM   1. Exudative age-related macular degeneration of right eye with active choroidal neovascularization (HCC)  H35.3211 OCT, Retina - OU - Both Eyes    Intravitreal Injection, Pharmacologic Agent - OD - Right Eye    Bevacizumab (AVASTIN) SOLN 1.25 mg    2. Advanced atrophic nonexudative age-related macular degeneration of left eye with subfoveal involvement  H35.3124     3. Essential hypertension  I10     4. Hypertensive retinopathy of both eyes  H35.033     5. Pseudophakia, both eyes  Z96.1      1. Exudative age related  macular degeneration, right eye  - delayed f/u - 5.9 wks instead of 4 due to weather/snow - delayed follow up from 8 weeks to 5 months (July 18, 2022 to December 25, 2022) due to hip fracture, sx and rehab - previously managed by Dr. Luciana Axe - last visit and injection w/ [facility] was 09.07.2023 (IVA OD) -- pt was on a q6-8 wk maintenance schedule -- presented at 4+ mos since last IVA OD w/ [facility] - s/p IVA OD #1 (01.30.24), #2 (03.12.24), #3 (05.07.24), #  4 (07.02.24), #5 (12.09.24), #6 (01.14.25)  - BCVA OD 20/150 from 20/100  - OCT OD: Central ORA with scattered PED / [facility] -- PED/SRHM slightly increased nasal fovea, PED temporal macula slightly improved, trace cystic changes temp fovea -- stably improved at 6 weeks  - exam shows persistent IRH temporal macula - recommend IVA OD #7 today, 02.24.25 with follow up back to 4 wks  - pt wishes to proceed with injection  - RBA of procedure discussed, questions answered - IVA informed consent obtained and signed 01.30.24 - see procedure note  - f/u in 4 wks, DFE, OCT, possible injxn  2. Age related macular degeneration, non-exudative, left eye  - advanced stage with large central area of GA  - BCVA decreased to 20/150 from 20/100  - The incidence, anatomy, and pathology of dry AMD, risk of progression, and the AREDS and AREDS 2 study including smoking risks discussed with patient.  - Recommend amsler grid monitoring  3,4. Hypertensive retinopathy OU - discussed importance of tight BP control - continue to monitor  5. Pseudophakia OU  - s/p CE/IOL OU (Dr. Zetta Bills)  - IOLs in good position, doing well  - continue to monitor  Ophthalmic Meds Ordered this visit:  Meds ordered this encounter  Medications   Bevacizumab (AVASTIN) SOLN 1.25 mg     Return in about 4 weeks (around 04/09/2023) for f/u exu ARMD OD, DFE, OCT.  There are no Patient Instructions on file for this visit.  This document serves as a record of services personally performed  by Karie Chimera, MD, PhD. It was created on their behalf by Glee Arvin. Manson Passey, OA an ophthalmic technician. The creation of this record is the provider's dictation and/or activities during the visit.    Electronically signed by: Glee Arvin. Manson Passey, OA 03/12/23 5:31 PM  Karie Chimera, M.D., Ph.D. Diseases & Surgery of the Retina and Vitreous [facility] 03/12/2023   I have reviewed the above documentation for accuracy and completeness, and I agree with the above. Karie Chimera, M.D., Ph.D. 03/12/23 5:33 PM    Abbreviations: M myopia (nearsighted); A astigmatism; H hyperopia (farsighted); P presbyopia; Mrx spectacle prescription;  CTL contact lenses; OD right eye; OS left eye; OU both eyes  XT exotropia; ET esotropia; PEK punctate epithelial keratitis; PEE punctate epithelial erosions; DES dry eye syndrome; MGD meibomian gland dysfunction; ATs artificial tears; PFAT's preservative free artificial tears; NSC nuclear sclerotic cataract; PSC posterior subcapsular cataract; ERM epi-retinal membrane; PVD posterior vitreous detachment; RD retinal detachment; DM diabetes mellitus; DR diabetic retinopathy; NPDR non-proliferative diabetic retinopathy; PDR proliferative diabetic retinopathy; CSME clinically significant macular edema; DME diabetic macular edema; dbh dot blot hemorrhages; CWS cotton wool spot; POAG primary open angle glaucoma; C/D cup-to-disc ratio; HVF humphrey visual field; GVF goldmann visual field; OCT optical coherence tomography; IOP intraocular pressure; BRVO Branch retinal vein occlusion; CRVO central retinal vein occlusion; CRAO central retinal artery occlusion; BRAO branch retinal artery occlusion; RT retinal tear; SB scleral buckle; PPV pars plana vitrectomy; VH Vitreous hemorrhage; PRP panretinal laser photocoagulation; IVK intravitreal kenalog; VMT vitreomacular traction; MH Macular hole;  NVD neovascularization of the disc; NVE neovascularization elsewhere;  AREDS age related eye disease study; ARMD age related macular degeneration; POAG primary open angle glaucoma; EBMD epithelial/anterior basement membrane dystrophy; ACIOL anterior chamber intraocular lens; IOL intraocular lens; PCIOL posterior chamber intraocular lens; Phaco/IOL phacoemulsification with intraocular lens placement; PRK photorefractive keratectomy; LASIK laser assisted in situ keratomileusis; HTN hypertension; DM  diabetes mellitus; COPD chronic obstructive pulmonary disease

## 2023-03-12 ENCOUNTER — Encounter (INDEPENDENT_AMBULATORY_CARE_PROVIDER_SITE_OTHER): Payer: Self-pay | Admitting: Ophthalmology

## 2023-03-12 ENCOUNTER — Ambulatory Visit (INDEPENDENT_AMBULATORY_CARE_PROVIDER_SITE_OTHER): Payer: Medicare (Managed Care) | Admitting: Ophthalmology

## 2023-03-12 DIAGNOSIS — I10 Essential (primary) hypertension: Secondary | ICD-10-CM

## 2023-03-12 DIAGNOSIS — Z961 Presence of intraocular lens: Secondary | ICD-10-CM

## 2023-03-12 DIAGNOSIS — H35033 Hypertensive retinopathy, bilateral: Secondary | ICD-10-CM

## 2023-03-12 DIAGNOSIS — H353124 Nonexudative age-related macular degeneration, left eye, advanced atrophic with subfoveal involvement: Secondary | ICD-10-CM

## 2023-03-12 DIAGNOSIS — H353211 Exudative age-related macular degeneration, right eye, with active choroidal neovascularization: Secondary | ICD-10-CM | POA: Diagnosis not present

## 2023-03-12 MED ORDER — BEVACIZUMAB CHEMO INJECTION 1.25MG/0.05ML SYRINGE FOR KALEIDOSCOPE
1.2500 mg | INTRAVITREAL | Status: AC | PRN
Start: 2023-03-12 — End: 2023-03-12
  Administered 2023-03-12: 1.25 mg via INTRAVITREAL

## 2023-04-04 NOTE — Addendum Note (Signed)
 Addended by: Elease Etienne A on: 04/04/2023 12:11 PM   Modules accepted: Orders

## 2023-04-04 NOTE — Progress Notes (Signed)
 Remote ICD transmission.

## 2023-04-06 ENCOUNTER — Emergency Department (HOSPITAL_COMMUNITY): Payer: Medicare (Managed Care)

## 2023-04-06 ENCOUNTER — Other Ambulatory Visit: Payer: Self-pay

## 2023-04-06 ENCOUNTER — Emergency Department (HOSPITAL_COMMUNITY)
Admission: EM | Admit: 2023-04-06 | Discharge: 2023-04-06 | Disposition: A | Discharge status: Home / Self Care | Payer: Medicare (Managed Care) | Attending: Student | Admitting: Student

## 2023-04-06 DIAGNOSIS — Z91040 Latex allergy status: Secondary | ICD-10-CM | POA: Insufficient documentation

## 2023-04-06 DIAGNOSIS — E1122 Type 2 diabetes mellitus with diabetic chronic kidney disease: Secondary | ICD-10-CM | POA: Diagnosis not present

## 2023-04-06 DIAGNOSIS — Z7982 Long term (current) use of aspirin: Secondary | ICD-10-CM | POA: Diagnosis not present

## 2023-04-06 DIAGNOSIS — N189 Chronic kidney disease, unspecified: Secondary | ICD-10-CM | POA: Diagnosis not present

## 2023-04-06 DIAGNOSIS — Z79899 Other long term (current) drug therapy: Secondary | ICD-10-CM | POA: Diagnosis not present

## 2023-04-06 DIAGNOSIS — I129 Hypertensive chronic kidney disease with stage 1 through stage 4 chronic kidney disease, or unspecified chronic kidney disease: Secondary | ICD-10-CM | POA: Insufficient documentation

## 2023-04-06 DIAGNOSIS — N3001 Acute cystitis with hematuria: Secondary | ICD-10-CM | POA: Insufficient documentation

## 2023-04-06 DIAGNOSIS — I2510 Atherosclerotic heart disease of native coronary artery without angina pectoris: Secondary | ICD-10-CM | POA: Diagnosis not present

## 2023-04-06 DIAGNOSIS — R1030 Lower abdominal pain, unspecified: Secondary | ICD-10-CM | POA: Diagnosis present

## 2023-04-06 LAB — COMPREHENSIVE METABOLIC PANEL
ALT: 32 U/L (ref 0–44)
AST: 44 U/L — ABNORMAL HIGH (ref 15–41)
Albumin: 3.3 g/dL — ABNORMAL LOW (ref 3.5–5.0)
Alkaline Phosphatase: 60 U/L (ref 38–126)
Anion gap: 10 (ref 5–15)
BUN: 12 mg/dL (ref 8–23)
CO2: 23 mmol/L (ref 22–32)
Calcium: 8.9 mg/dL (ref 8.9–10.3)
Chloride: 98 mmol/L (ref 98–111)
Creatinine, Ser: 1.23 mg/dL — ABNORMAL HIGH (ref 0.44–1.00)
GFR, Estimated: 42 mL/min — ABNORMAL LOW (ref >60)
Glucose, Bld: 128 mg/dL — ABNORMAL HIGH (ref 70–99)
Potassium: 3.9 mmol/L (ref 3.5–5.1)
Sodium: 131 mmol/L — ABNORMAL LOW (ref 135–145)
Total Bilirubin: 0.6 mg/dL (ref 0.0–1.2)
Total Protein: 6.5 g/dL (ref 6.5–8.1)

## 2023-04-06 LAB — CBC WITH DIFFERENTIAL/PLATELET
Abs Immature Granulocytes: 0.02 10*3/uL (ref 0.00–0.07)
Basophils Absolute: 0 10*3/uL (ref 0.0–0.1)
Basophils Relative: 1 %
Eosinophils Absolute: 0.3 10*3/uL (ref 0.0–0.5)
Eosinophils Relative: 6 %
HCT: 31.2 % — ABNORMAL LOW (ref 36.0–46.0)
Hemoglobin: 9.9 g/dL — ABNORMAL LOW (ref 12.0–15.0)
Immature Granulocytes: 1 %
Lymphocytes Relative: 24 %
Lymphs Abs: 1.1 10*3/uL (ref 0.7–4.0)
MCH: 28.5 pg (ref 26.0–34.0)
MCHC: 31.7 g/dL (ref 30.0–36.0)
MCV: 89.9 fL (ref 80.0–100.0)
Monocytes Absolute: 0.5 10*3/uL (ref 0.1–1.0)
Monocytes Relative: 12 %
Neutro Abs: 2.5 10*3/uL (ref 1.7–7.7)
Neutrophils Relative %: 56 %
Platelets: 232 10*3/uL (ref 150–400)
RBC: 3.47 MIL/uL — ABNORMAL LOW (ref 3.87–5.11)
RDW: 17.2 % — ABNORMAL HIGH (ref 11.5–15.5)
WBC: 4.4 10*3/uL (ref 4.0–10.5)
nRBC: 0 % (ref 0.0–0.2)

## 2023-04-06 LAB — URINALYSIS, ROUTINE W REFLEX MICROSCOPIC
Bilirubin Urine: NEGATIVE
Glucose, UA: NEGATIVE mg/dL
Ketones, ur: NEGATIVE mg/dL
Leukocytes,Ua: NEGATIVE
Nitrite: POSITIVE — AB
Protein, ur: NEGATIVE mg/dL
Specific Gravity, Urine: 1.003 — ABNORMAL LOW (ref 1.005–1.030)
pH: 6 (ref 5.0–8.0)

## 2023-04-06 LAB — LIPASE, BLOOD: Lipase: 36 U/L (ref 11–51)

## 2023-04-06 MED ORDER — SODIUM CHLORIDE 0.9 % IV SOLN
1.0000 g | Freq: Once | INTRAVENOUS | Status: AC
  Administered 2023-04-06: 1 g via INTRAVENOUS
  Filled 2023-04-06: qty 10

## 2023-04-06 MED ORDER — CEPHALEXIN 500 MG PO CAPS: 500.0000 mg | ORAL_CAPSULE | Freq: Four times a day (QID) | ORAL | 0 refills | Status: DC

## 2023-04-06 NOTE — ED Triage Notes (Signed)
 Coming from home via EMS, lives at home alone. Lower Abd pain x 1 day. Nausea without vomiting. Last bowel movement over 1 day ago. PMH diverticulitis, HTN, has defibrillator, on eliquis. VSS. Cbg 145. Denies urinary symptoms.

## 2023-04-06 NOTE — ED Provider Notes (Signed)
 [facility]MENT AT [facility] Provider Note   CSN: [id number] Arrival date & time: 04/06/23  1544     History  Chief Complaint  Patient presents with   Abdominal Pain    [name] is a 89 y.o. female.   Abdominal Pain Associated symptoms: constipation, dysuria and nausea   Associated symptoms: no chest pain, no fever, no shortness of breath and no vomiting        [name] is a 89 y.o. female past medical history of hypertension, coronary artery disease, atrial fibrillation, CKD, type 2 diabetes, has cardiac defibrillator in place and history of colitis (anticoagulated on Eliquis) who presents to the Emergency Department from home for reports of diffuse lower abdominal pain and nausea.  States that she has not had a bowel movement in 2 days.  States this is atypical for her.  Describes constant abdominal pain.  Denies any fever or vomiting.  States that she has diminished appetite only able to tolerate small amounts of solid foods.  Some urinary hesitancy but denies pain or burning with urination.  Denies any black or bloody stools.      Home Medications Prior to Admission medications   Medication Sig Start Date End Date Taking? Authorizing Provider  acetaminophen (TYLENOL) 500 MG tablet Take 1 tablet (500 mg total) by mouth every 6 (six) hours as needed for mild pain, fever or headache. 04/05/20   Laural Benes, Clanford L, MD  Alpha-Lipoic Acid 600 MG TABS Take 600 mg by mouth daily. For neuropathy in legs Patient taking differently: Take 100 mg by mouth daily. 08/18/20   Raliegh Ip, DO  amiodarone (PACERONE) 200 MG tablet Take 1 tablet (200 mg total) by mouth daily. 10/04/20   Croitoru, Mihai, MD  amLODipine (NORVASC) 5 MG tablet Take 5 mg by mouth daily.    [provider]  aspirin EC 81 MG EC tablet Take 1 tablet (81 mg total) by mouth daily. Swallow whole. 02/09/21   Willeen Niece, MD  carboxymethylcellulose (REFRESH PLUS)  0.5 % SOLN Place 1 drop into both eyes 3 (three) times daily as needed (dry/irritated eyes.).    [provider]  docusate sodium (COLACE) 100 MG capsule Take 1 capsule (100 mg total) by mouth daily. 11/30/22   Mahon, Frederik Schmidt, NP  ELIQUIS 2.5 MG TABS tablet TAKE (1) TABLET TWICE A DAY. Patient taking differently: Take 2.5 mg by mouth 2 (two) times daily. 02/18/21   Croitoru, Mihai, MD  feeding supplement (ENSURE ENLIVE / ENSURE PLUS) LIQD Take 237 mLs by mouth 2 (two) times daily between meals. 09/12/22   Rolly Salter, MD  gabapentin (NEURONTIN) 100 MG capsule Take 1 capsule (100 mg total) by mouth at bedtime. 09/12/22   Rolly Salter, MD  isosorbide mononitrate (IMDUR) 60 MG 24 hr tablet Take 1 tablet (60 mg total) by mouth daily. 01/31/21   Croitoru, Mihai, MD  latanoprost (XALATAN) 0.005 % ophthalmic solution Place 1 drop into the left eye at bedtime. 07/12/21   [provider]  levothyroxine (SYNTHROID) 112 MCG tablet Take 112 mcg by mouth daily.    [provider]  MELATONIN PO Take 10 mg by mouth at bedtime.    [provider]  metoprolol succinate (TOPROL-XL) 25 MG 24 hr tablet Take 0.5 tablets (12.5 mg total) by mouth daily. Take with or immediately following a meal. Patient taking differently: Take 12.5 mg by mouth 2 (two) times daily. Take with or immediately following  a meal. 01/31/21 11/30/22  Croitoru, Mihai, MD  Multiple Vitamins-Minerals (CENTRUM SILVER ULTRA WOMENS PO) Take 1 tablet by mouth every morning.    [provider]  nitroGLYCERIN (NITROSTAT) 0.4 MG SL tablet PLACE 1 TABLET UNDER THE TONGUE AT ONSET OF CHEST PAIN EVERY 5 MINTUES UP TO 3 TIMES AS NEEDED 01/05/21   Mechele Claude, MD  pantoprazole (PROTONIX) 40 MG tablet Take 1 tablet (40 mg total) by mouth 2 (two) times daily before a meal. 11/30/22   Mahon, Frederik Schmidt, NP  polyethylene glycol (MIRALAX / GLYCOLAX) 17 g packet Take 17 g by mouth daily as needed for mild constipation.  09/12/22   Rolly Salter, MD  rosuvastatin (CRESTOR) 10 MG tablet TAKE 1 TABLET DAILY 05/17/21   Delynn Flavin M, DO  simethicone (MYLICON) 125 MG chewable tablet Chew 125 mg by mouth every 6 (six) hours as needed for flatulence.    [provider]      Allergies    Sotalol, Tramadol, Actos [pioglitazone], Adhesive [tape], Atorvastatin, Bactrim [sulfamethoxazole-trimethoprim], Ciprofloxacin, Contrast media [iodinated contrast media], Latex, Pedi-pre tape spray [wound dressing adhesive], and Sulfa antibiotics    Review of Systems   Review of Systems  Constitutional:  Positive for appetite change. Negative for fever.  Respiratory:  Negative for chest tightness and shortness of breath.   Cardiovascular:  Negative for chest pain and leg swelling.  Gastrointestinal:  Positive for abdominal pain, constipation and nausea. Negative for vomiting.  Genitourinary:  Positive for dysuria. Negative for decreased urine volume, difficulty urinating and flank pain.  Musculoskeletal:  Negative for arthralgias.  Skin:  Negative for rash.  Neurological:  Negative for dizziness, numbness and headaches.    Physical Exam Updated Vital Signs BP (!) 147/74 (BP Location: Left Arm)   Pulse 61   Temp 97.7 F (36.5 C)   Resp 18   Ht 5\' 3"  (1.6 m)   Wt 47.2 kg   SpO2 94%   BMI 18.42 kg/m  Physical Exam Vitals and nursing note reviewed.  Constitutional:      General: She is not in acute distress.    Appearance: She is well-developed. She is not toxic-appearing.     Comments: Frail-appearing  HENT:     Mouth/Throat:     Mouth: Mucous membranes are moist.  Cardiovascular:     Rate and Rhythm: Normal rate and regular rhythm.     Pulses: Normal pulses.  Pulmonary:     Effort: Pulmonary effort is normal.  Chest:     Chest wall: No tenderness.  Abdominal:     General: There is no distension.     Palpations: Abdomen is soft.     Tenderness: There is abdominal tenderness. There is no right  CVA tenderness, left CVA tenderness or guarding.  Musculoskeletal:        General: Normal range of motion.     Cervical back: No tenderness.     Right lower leg: No edema.     Left lower leg: No edema.  Skin:    General: Skin is warm.     Capillary Refill: Capillary refill takes less than 2 seconds.  Neurological:     General: No focal deficit present.     Mental Status: She is alert.     Sensory: No sensory deficit.     Motor: No weakness.     ED Results / Procedures / Treatments   Labs (all labs ordered are listed, but only abnormal results are displayed) Labs Reviewed  CBC WITH DIFFERENTIAL/PLATELET - Abnormal; Notable for the following components:      Result Value   RBC 3.47 (*)    Hemoglobin 9.9 (*)    HCT 31.2 (*)    RDW 17.2 (*)    All other components within normal limits  COMPREHENSIVE METABOLIC PANEL - Abnormal; Notable for the following components:   Sodium 131 (*)    Glucose, Bld 128 (*)    Creatinine, Ser 1.23 (*)    Albumin 3.3 (*)    AST 44 (*)    GFR, Estimated 42 (*)    All other components within normal limits  URINALYSIS, ROUTINE W REFLEX MICROSCOPIC - Abnormal; Notable for the following components:   APPearance HAZY (*)    Specific Gravity, Urine 1.003 (*)    Hgb urine dipstick SMALL (*)    Nitrite POSITIVE (*)    Bacteria, UA FEW (*)    All other components within normal limits  URINE CULTURE  LIPASE, BLOOD    EKG None  Radiology CT ABDOMEN PELVIS WO CONTRAST Result Date: 04/06/2023 CLINICAL DATA:  Abdominal pain, acute, nonlocalized Lower Abd pain x 1 day. Nausea without vomiting. Last bowel movement over 1 day ago. PMH diverticulitis, HTN, has defibrillator, on eliquis. EXAM: CT ABDOMEN AND PELVIS WITHOUT CONTRAST TECHNIQUE: Multidetector CT imaging of the abdomen and pelvis was performed following the standard protocol without IV contrast. RADIATION DOSE REDUCTION: This exam was performed according to the departmental dose-optimization  program which includes automated exposure control, adjustment of the mA and/or kV according to patient size and/or use of iterative reconstruction technique. COMPARISON:  CT abdomen pelvis 10/17/2022, CT chest, abdomen, pelvis 07/13/2020 FINDINGS: Lower chest: Enlarged cardiac silhouette. Partially visualized cardiac pacemaker and defibrillator leads. Small hiatal hernia. Coronary stent. Coronary artery calcification. Mitral annular calcification. Stable right lower lobe 4 mm pulmonary nodule-no further follow-up indicated. Hepatobiliary: Hyperdense liver. No focal liver abnormality. No gallstones, gallbladder wall thickening, or pericholecystic fluid. No biliary dilatation. Pancreas: Limited evaluation on this noncontrast study. Normal pancreatic contour. No surrounding inflammatory changes. No main pancreatic ductal dilatation. Spleen: Normal in size without focal abnormality. Adrenals/Urinary Tract: No adrenal nodule bilaterally. Atrophic left kidney. No nephrolithiasis and no hydronephrosis. No definite contour-deforming renal mass. No ureterolithiasis or hydroureter. Bilateral nephrolithiasis measuring up to 8 mm on the right and punctate on the left. The urinary bladder is unremarkable. Stomach/Bowel: Stomach is within normal limits. No evidence of bowel wall thickening or dilatation. Appendix appears normal. Vascular/Lymphatic: No abdominal aorta or iliac aneurysm. Severe atherosclerotic plaque of the aorta and its branches. No abdominal, pelvic, or inguinal lymphadenopathy. Reproductive: Uterus and bilateral adnexa are unremarkable. Other: No intraperitoneal free fluid. No intraperitoneal free gas. No organized fluid collection. Musculoskeletal: No abdominal wall hernia or abnormality. Diffusely decreased bone density. No suspicious lytic or blastic osseous lesions. No acute displaced fracture. Levoscoliosis of the lumbar spine with associated severe degenerative changes of the spine. Multilevel chronic  vertebral body height loss. Intramedullary nail fixation of the left femur. Associated nonunionized chronic fracture. IMPRESSION: 1. Limited evaluation on this noncontrast study. 2. Small hiatal hernia. 3. Hyperdense liver suggestive of hemosiderosis. 4. Nonobstructive bilateral nephrolithiasis. 5. Atrophic left kidney. 6. Aortic Atherosclerosis (ICD10-I70.0) including mitral annular and coronary artery calcifications. Electronically Signed   By: Tish Frederickson M.D.   On: 04/06/2023 20:20    Procedures Procedures    Medications Ordered in ED Medications - No data to display  ED Course/ Medical Decision Making/ A&P  Medical Decision Making Patient here from home with complaint of diffuse abdominal pain with nausea and decreased appetite for 2 days.  No bowel movement x 2 days.  She is also having some urinary hesitancy but no pain or burning with urination.  No fever or chills.  No recent abdominal surgeries.  Differential would include but not limited to acute intra-abdominal process such as SBO, acute diverticulitis, appendicitis, cystitis  Amount and/or Complexity of Data Reviewed Labs: ordered.    Details: Labs, no evidence of leukocytosis, hemoglobin at baseline.  Chemistries show mildly elevated serum creatinine but also near baseline.  Lipase unremarkable.  Urinalysis shows positive nitrates with 6-10 white cells, urine culture is pending Radiology: ordered.    Details: CT abdomen and pelvis performed without contrast due to patient's allergy.  No evidence of bowel obstruction or emergent process ECG/medicine tests: ordered.    Details: EKG shows sinus or ectopic atrial rhythm, prolonged PR interval left bundle branch block Discussion of management or test interpretation with external provider(s):   Patient with likely acute cystitis, no evidence of sepsis.  CT without emergent findings.  Urine culture is pending.  Patient given IV Rocephin here.   Appears appropriate for discharge home, prescription written for cephalexin and patient and family agreed to close outpatient follow-up.  Return precautions were given.  Risk Prescription drug management.           Final Clinical Impression(s) / ED Diagnoses Final diagnoses:  Acute cystitis with hematuria    Rx / DC Orders ED Discharge Orders     None         Rosey Bath 04/06/23 2305    Glendora Score, MD 04/08/23 1202

## 2023-04-06 NOTE — Discharge Instructions (Addendum)
 Your workup this evening shows that you have a urinary tract infection.  You have been given antibiotics here this evening.  You have also been prescribed antibiotics to take for the next 7 days, please take as directed until finished.  Also drink plenty of water.  Follow-up with your primary care provider next week for recheck.  Return to the emergency department if you develop any new or worsening symptoms.

## 2023-04-08 LAB — URINE CULTURE: Culture: <10000 — AB

## 2023-04-11 NOTE — Progress Notes (Signed)
 [facility] - Clinic Note  04/13/2023     CHIEF COMPLAINT Patient presents for Retina Follow Up   HISTORY OF PRESENT ILLNESS: [name] is a 89 y.o. female who presents to the clinic today for:   HPI     Retina Follow Up   Patient presents with  Wet AMD.  In right eye.  This started 4 weeks ago.  Duration of 4 weeks.  Since onset it is gradually worsening.  I, the attending physician,  performed the HPI with the patient and updated documentation appropriately.        Comments   Patient states that the vision is getting worse, the vision is very blurry. She is using AT's and Latanoprost OS at bedtime. She does not check her blood sugar.       Last edited by Rennis Chris, MD on 04/13/2023  1:11 PM.    Pt feels like her vision is getting worse, she was in the ED last week for a UTI   Referring physician: No referring provider defined for this encounter.  HISTORICAL INFORMATION:   Selected notes from the MEDICAL RECORD NUMBER [name] -- transferring care due to insurance LEE: 09.07.23 -- BCVA CF OU Ocular Hx- ex ARMD OD -- s/p multiple IVA, last IVA OD 09.07.24 (4+ mos) PMH-    CURRENT MEDICATIONS: Current Outpatient Medications (Ophthalmic Drugs)  Medication Sig   carboxymethylcellulose (REFRESH PLUS) 0.5 % SOLN Place 1 drop into both eyes 3 (three) times daily as needed (dry/irritated eyes.).   latanoprost (XALATAN) 0.005 % ophthalmic solution Place 1 drop into the left eye at bedtime.   No current facility-administered medications for this visit. (Ophthalmic Drugs)   Current Outpatient Medications (Other)  Medication Sig   acetaminophen (TYLENOL) 500 MG tablet Take 1 tablet (500 mg total) by mouth every 6 (six) hours as needed for mild pain, fever or headache.   Alpha-Lipoic Acid 600 MG TABS Take 600 mg by mouth daily. For neuropathy in legs (Patient taking differently: Take 100 mg by mouth daily.)   amiodarone (PACERONE) 200 MG tablet  Take 1 tablet (200 mg total) by mouth daily.   amLODipine (NORVASC) 5 MG tablet Take 5 mg by mouth daily.   aspirin EC 81 MG EC tablet Take 1 tablet (81 mg total) by mouth daily. Swallow whole.   cephALEXin (KEFLEX) 500 MG capsule Take 1 capsule (500 mg total) by mouth 4 (four) times daily.   docusate sodium (COLACE) 100 MG capsule Take 1 capsule (100 mg total) by mouth daily.   ELIQUIS 2.5 MG TABS tablet TAKE (1) TABLET TWICE A DAY. (Patient taking differently: Take 2.5 mg by mouth 2 (two) times daily.)   feeding supplement (ENSURE ENLIVE / ENSURE PLUS) LIQD Take 237 mLs by mouth 2 (two) times daily between meals.   gabapentin (NEURONTIN) 100 MG capsule Take 1 capsule (100 mg total) by mouth at bedtime.   isosorbide mononitrate (IMDUR) 60 MG 24 hr tablet Take 1 tablet (60 mg total) by mouth daily.   levothyroxine (SYNTHROID) 112 MCG tablet Take 112 mcg by mouth daily.   MELATONIN PO Take 10 mg by mouth at bedtime.   Multiple Vitamins-Minerals (CENTRUM SILVER ULTRA WOMENS PO) Take 1 tablet by mouth every morning.   nitroGLYCERIN (NITROSTAT) 0.4 MG SL tablet PLACE 1 TABLET UNDER THE TONGUE AT ONSET OF CHEST PAIN EVERY 5 MINTUES UP TO 3 TIMES AS NEEDED   pantoprazole (PROTONIX) 40 MG tablet Take 1 tablet (  40 mg total) by mouth 2 (two) times daily before a meal.   polyethylene glycol (MIRALAX / GLYCOLAX) 17 g packet Take 17 g by mouth daily as needed for mild constipation.   rosuvastatin (CRESTOR) 10 MG tablet TAKE 1 TABLET DAILY   simethicone (MYLICON) 125 MG chewable tablet Chew 125 mg by mouth every 6 (six) hours as needed for flatulence.   metoprolol succinate (TOPROL-XL) 25 MG 24 hr tablet Take 0.5 tablets (12.5 mg total) by mouth daily. Take with or immediately following a meal. (Patient taking differently: Take 12.5 mg by mouth 2 (two) times daily. Take with or immediately following a meal.)   No current facility-administered medications for this visit. (Other)   REVIEW OF SYSTEMS: ROS    Positive for: Cardiovascular, Eyes Negative for: Constitutional, Gastrointestinal, Neurological, Skin, Genitourinary, Musculoskeletal, HENT, Endocrine, Respiratory, Psychiatric, Allergic/Imm, Heme/Lymph Last edited by Charlette Caffey, COT on 04/13/2023 12:40 PM.     ALLERGIES Allergies  Allergen Reactions   Sotalol Other (See Comments)    Torsades    Tramadol Other (See Comments)    Hallucination, bad-dream   Actos [Pioglitazone] Swelling   Adhesive [Tape] Other (See Comments)    Causes sores   Atorvastatin Other (See Comments)    myalgia   Bactrim [Sulfamethoxazole-Trimethoprim] Itching and Rash    Itchy rash   Ciprofloxacin Nausea Only   Contrast Media [Iodinated Contrast Media] Other (See Comments)    Headache (no action/pre-med required)   Latex Rash   Pedi-Pre Tape Spray [Wound Dressing Adhesive] Other (See Comments)    Causes sores   Sulfa Antibiotics Other (See Comments)    Causes sores   PAST MEDICAL HISTORY Past Medical History:  Diagnosis Date   AICD (automatic cardioverter/defibrillator) present    Allergy    SESONAL   Aneurysm of infrarenal abdominal aorta (HCC)    Anxiety    ARTHRITIS    Arthritis    Atrial fibrillation (HCC)    CAD (coronary artery disease)    a. history of cardiac arrest 1993;  b. s/p LAD/LCX stenting in 2003;  c. 07/2011 Cath: LM nl, LAD patent stent, D1 80ost, LCX patent stent, RCA min irregs;  d. 04/2012 MV: EF 66%, no ishcemia;  e. 12/2013 Echo: Ef 55%, no rwma, Gr 1 DD, triv AI/MR, mildly dil LA;  f. 12/2013 Lexi MV: intermediate risk - apical ischemia and inf/infsept fixed defect, ? artifactual.   CAD in native artery 12/31/2013   Previous stents to LAD and Ramus patent on cath today 12/31/13 also There is severe disease in the ostial first diagonal which is unchanged from most recent cardiac catheterization. The right coronary artery could not be engaged selectively but nonselective angiography showed no significant disease in the  proximal and midsegment.       Cataract    DENIES   Chronic back pain    Chronic sinus bradycardia    CKD (chronic kidney disease), stage III (HCC)    Clotting disorder (HCC)    DVT   COLITIS 12/02/2007   Qualifier: Diagnosis of  By: Koleen Distance CMA Duncan Dull), Hulan Saas     Diabetes mellitus without complication (HCC)    DENIES   Diverticulosis of colon (without mention of hemorrhage) 2007   Colonoscopy    Esophageal stricture    a. 2012 s/p dil.   Esophagitis, unspecified    a. 2012 EGD   EXTERNAL HEMORRHOIDS    GERD (gastroesophageal reflux disease)    omeprazole   H/O hiatal hernia  Hiatal hernia    a. 2012 EGD.   History of DVT in the past, not on Coumadin now    left  leg   HYPERCHOLESTEROLEMIA    Hypertension    ICD (implantable cardiac defibrillator) in place    a. s/p initial ICD in 1993 in setting of cardiac arrest;  b. 01/2007 gen change: Guidant T135 Vitality DS VR single lead ICD.   Macular degeneration    gets injecton in eye every 5 weeks- last injection - 05/03/2013    Myocardial infarction [facility]) 1993   Near syncope 05/22/2015   Nephrolithiasis, just saw Dr Annabell Howells- "OK"    Orthostatic hypotension    Peripheral vascular disease (HCC)    ???   RECTAL BLEEDING 12/03/2007   Qualifier: Diagnosis of  By: Jarold Motto MD Mervyn Skeeters R    Unspecified gastritis and gastroduodenitis without mention of hemorrhage    a. 2003 EGD->not noted on 2012 EGD.   Unstable angina, neg MI, cath stable.maybe GI 12/30/2013   Past Surgical History:  Procedure Laterality Date   BREAST BIOPSY Left 11/2013   CARDIAC CATHETERIZATION     CARDIAC CATHETERIZATION  01/01/15   patent stents -there is severe disease in ostial 1st diag but without change.    CARDIAC DEFIBRILLATOR PLACEMENT  02/05/2007   Guidant   CARDIOVASCULAR STRESS TEST  12/30/2013   abnormal   CARDIOVERSION N/A 02/13/2019   Procedure: CARDIOVERSION;  Surgeon: Thurmon Fair, MD;  Location: [facility] ENDOS[facility];  Service: Cardiovascular;   Laterality: N/A;   CATARACT EXTRACTION Right    Dr. Dione Booze   COLONOSCOPY     COLONOSCOPY WITH PROPOFOL N/A 03/30/2020   Procedure: COLONOSCOPY WITH PROPOFOL;  Surgeon: Dolores Frame, MD;  Location: [facility];  Service: Gastroenterology;  Laterality: N/A;   coronary stents      CYSTOSCOPY WITH URETEROSCOPY Right 05/20/2013   Procedure: CYSTOSCOPY, RIGHT URETEROSCOPY STONE EXTRACTION, Insertion of right DOUBLE J STENT ;  Surgeon: Bjorn Pippin, MD;  Location: [facility] ORS;  Service: Urology;  Laterality: Right;   CYSTOSCOPY WITH URETEROSCOPY AND STENT PLACEMENT N/A 06/03/2013   Procedure: SECOND LOOK CYSTOSCOPY WITH URETEROSCOPY  HOLMIUM LASER LITHO AND STONE EXTRACTION Newman Nip ;  Surgeon: Anner Crete, MD;  Location: [facility];  Service: Urology;  Laterality: N/A;   ESOPHAGOGASTRODUODENOSCOPY (EGD) WITH PROPOFOL N/A 11/20/2019   Procedure: ESOPHAGOGASTRODUODENOSCOPY (EGD) WITH PROPOFOL;  Surgeon: Corbin Ade, MD;  Location: [facility];  Service: Endoscopy;  Laterality: N/A;   GIVENS CAPSULE STUDY N/A 10/30/2016   Procedure: GIVENS CAPSULE STUDY;  Surgeon: Hilarie Fredrickson, MD;  Location[facility] ENDOSCOPY;  Service: Endoscopy;  Laterality: N/A;  NEEDS TO BE ADMITTED FOR OBSERVATION PT. HAS DEFIB   HEMORROIDECTOMY  80's   HOLMIUM LASER APPLICATION Right 05/20/2013   Procedure: HOLMIUM LASER APPLICATION;  Surgeon: Bjorn Pippin, MD;  Location: [facility];  Service: Urology;  Laterality: Right;   HYSTEROSCOPY WITH D & C  09/02/2010   Procedure: DILATATION AND CURETTAGE (D&C) /HYSTEROSCOPY;  Surgeon: Meriel Pica;  Location: [facility] ORS;  Service: Gynecology;  Laterality: N/A;  Dilation and Curettage with Hysteroscopy and Polypectomy   IMPLANTABLE CARDIOVERTER DEFIBRILLATOR (ICD) GENERATOR CHANGE N/A 02/10/2014   Procedure: ICD GENERATOR CHANGE;  Surgeon: Thurmon Fair, MD;  Location: [facility] CATH LAB;  Service: Cardiovascular;  Laterality: N/A;   INTRAMEDULLARY (IM) NAIL INTERTROCHANTERIC Left 09/09/2022    Procedure: INTRAMEDULLARY (IM) NAIL INTERTROCHANTERIC;  Surgeon: Durene Romans, MD;  Location: [facility];  Service: Orthopedics;  Laterality: Left;  LEFT HEART CATHETERIZATION WITH CORONARY ANGIOGRAM N/A 07/28/2011   Procedure: LEFT HEART CATHETERIZATION WITH CORONARY ANGIOGRAM;  Surgeon: Runell Gess, MD;  Location: [facility] CATH LAB;  Service: Cardiovascular;  Laterality: N/A;   LEFT HEART CATHETERIZATION WITH CORONARY ANGIOGRAM N/A 12/31/2013   Procedure: LEFT HEART CATHETERIZATION WITH CORONARY ANGIOGRAM;  Surgeon: Iran Ouch, MD;  Location: [facility] CATH LAB;  Service: Cardiovascular;  Laterality: N/A;   FAMILY HISTORY Family History  Problem Relation Age of Onset   Stroke Mother    Other Mother        brain tumor   Other Father        MI   Heart attack Father    Stroke Sister    Macular degeneration Sister    Diabetes Daughter    Cancer Daughter        ovarian   Colon polyps Daughter    Atrial fibrillation Sister    Hyperlipidemia Sister    Osteoporosis Sister    Stroke Sister    Uterine cancer Sister    Heart attack Brother    Heart disease Brother    Asthma Brother    Breast cancer Neg Hx    SOCIAL HISTORY Social History   Tobacco Use   Smoking status: Never   Smokeless tobacco: Never  Vaping Use   Vaping status: Never Used  Substance Use Topics   Alcohol use: No   Drug use: No       OPHTHALMIC EXAM:  Base Eye Exam     Visual Acuity (Snellen - Linear)       Right Left   Dist cc 20/150 20/150   Dist ph cc NI NI    Correction: Glasses         Tonometry (Tonopen, 12:44 PM)       Right Left   Pressure 17 14         Pupils       Dark Light Shape React APD   Right 3 2 Round Brisk None   Left 3 2 Round Brisk None         Visual Fields       Left Right    Full Full         Extraocular Movement       Right Left    Full, Ortho Full, Ortho         Neuro/Psych     Oriented x3: Yes   Mood/Affect: Normal         Dilation      Both eyes: 1.0% Mydriacyl, 2.5% Phenylephrine @ 12:41 PM           Slit Lamp and Fundus Exam     Slit Lamp Exam       Right Left   Lids/Lashes Dermatochalasis - upper lid Dermatochalasis - upper lid   Conjunctiva/Sclera White and quiet White and quiet   Cornea 1-2+ Punctate epithelial erosions, well healed cataract wound 2+ fine Punctate epithelial erosions, well healed cataract wound   Anterior Chamber deep and clear deep and clear   Iris Round and dilated Round and dilated   Lens PC IOL in good position PC IOL in good position   Anterior Vitreous mild syneresis, Posterior vitreous detachment, Vitreous condensations mild syneresis, silicone oil microbubbles, PVD         Fundus Exam       Right Left   Disc mild Pallor, Sharp rim, temporal PPA mild Pallor, Sharp rim, temporal PPA   C/D  Ratio 0.1 0.1   Macula Blunted foveal reflex, central GA with +CNV and IRH centrally Drusen, RPE mottling and clumping Blunted foveal reflex, large area of GA centrally, Drusen, RPE mottling, clumping and atrophy, no heme   Vessels attenuated, Tortuous attenuated, mild tortuosity   Periphery Attached, reticular degeneration, No heme Attached, reticular degeneration, No heme           Refraction     Wearing Rx       Sphere Cylinder Axis Add   Right -1.00 +1.25 160 +2.25   Left -1.00 +0.75 150 +2.25           IMAGING AND PROCEDURES  Imaging and Procedures for 04/13/2023  OCT, Retina - OU - Both Eyes       Right Eye Quality was good. Central Foveal Thickness: 309. Progression has been stable. Findings include normal foveal contour, no IRF, no SRF, retinal drusen , subretinal hyper-reflective material, pigment epithelial detachment, outer retinal atrophy (Central ORA with scattered PED / [facility] -- PED/SRHM stably improved nasal fovea, PED temporal macula -- stable).   Left Eye Quality was good. Central Foveal Thickness: 205. Progression has been stable. Findings include normal foveal  contour, no IRF, no SRF, retinal drusen , subretinal hyper-reflective material, outer retinal atrophy (Central [facility] with [facility]).   Notes *Images captured and stored on drive  Diagnosis / Impression:  OD: Exudative ARMD - Central ORA with scattered PED / [facility] -- PED/SRHM stably improved nasal fovea, PED temporal macula -- stable OS: Nonexudative ARMD - Central ORA with [facility]  Clinical management:  See below  Abbreviations: NFP - Normal foveal profile. CME - cystoid macular edema. PED - pigment epithelial detachment. IRF - intraretinal fluid. SRF - subretinal fluid. EZ - ellipsoid zone. ERM - epiretinal membrane. ORA - outer retinal atrophy. ORT - outer retinal tubulation. SRHM - subretinal hyper-reflective material. IRHM - intraretinal hyper-reflective material      Intravitreal Injection, Pharmacologic Agent - OD - Right Eye       Time Out 04/13/2023. 1:00 PM. Confirmed correct patient, procedure, site, and patient consented.   Anesthesia Topical anesthesia was used. Anesthetic medications included Lidocaine 2%, Proparacaine 0.5%.   Procedure Preparation included 5% betadine to ocular surface, eyelid speculum. A (32g) needle was used.   Injection: 1.25 mg Bevacizumab 1.25mg /0.9ml   Route: Intravitreal, Site: Right Eye   NDC: [id number], Lot: [id number], Expiration date: 07/15/2023   Post-op Post injection exam found visual acuity of at least counting fingers. The patient tolerated the procedure well. There were no complications. The patient received written and verbal post procedure care education. Post injection medications were not given.            ASSESSMENT/PLAN:    ICD-10-CM   1. Exudative age-related macular degeneration of right eye with active choroidal neovascularization (HCC)  H35.3211 OCT, Retina - OU - Both Eyes    Intravitreal Injection, Pharmacologic Agent - OD - Right Eye    Bevacizumab (AVASTIN) SOLN 1.25 mg    2. Advanced atrophic nonexudative age-related  macular degeneration of left eye with subfoveal involvement  H35.3124     3. Essential hypertension  I10     4. Hypertensive retinopathy of both eyes  H35.033     5. Pseudophakia, both eyes  Z96.1      1. Exudative age related macular degeneration, right eye  - delayed f/u - 5.9 wks instead of 4 due to weather/snow on 02.24.25 - delayed follow up from 8 weeks to 5 months (  July 18, 2022 to December 25, 2022) due to hip fracture, sx and rehab - previously managed by Dr. Luciana Axe - last visit and injection w/ [facility] was 09.07.2023 (IVA OD) -- pt was on a q6-8 wk maintenance schedule -- presented at 4+ mos since last IVA OD w/ [facility] - s/p IVA OD #1 (01.30.24), #2 (03.12.24), #3 (05.07.24), #4 (07.02.24), #5 (12.09.24), #6 (01.14.25), #7 (02.24.25)  - BCVA OD stable at 20/150   - OCT OD: Central ORA with scattered PED / [facility] -- PED/SRHM slightly increased nasal fovea, PED temporal macula slightly improved, trace cystic changes temp fovea -- stably improved at 4 weeks  - exam shows persistent IRH temporal macula - recommend IVA OD #8 today, 03.28.25 for persistent hemorrhage with follow up in 4-5 wks  - pt wishes to proceed with injection  - RBA of procedure discussed, questions answered - IVA informed consent obtained and signed 01.30.24 - see procedure note  - f/u in 4-5 wks, DFE, OCT, possible injxn  2. Age related macular degeneration, non-exudative, left eye  - advanced stage with large central area of GA  - BCVA stable at from 20/150 - The incidence, anatomy, and pathology of dry AMD, risk of progression, and the AREDS and AREDS 2 study including smoking risks discussed with patient.  - Recommend amsler grid monitoring  3,4. Hypertensive retinopathy OU - discussed importance of tight BP control - continue to monitor  5. Pseudophakia OU  - s/p CE/IOL OU (Dr. Zetta Bills)  - IOLs in good position, doing well  - continue to monitor  Ophthalmic Meds Ordered this visit:  Meds ordered  this encounter  Medications   Bevacizumab (AVASTIN) SOLN 1.25 mg     Return for f/u 4-5 weeks, exu ARMD OD, DFE, OCT, Possible Injxn.  There are no Patient Instructions on file for this visit.  This document serves as a record of services personally performed by Karie Chimera, MD, PhD. It was created on their behalf by Glee Arvin. Manson Passey, OA an ophthalmic technician. The creation of this record is the provider's dictation and/or activities during the visit.    Electronically signed by: Glee Arvin. Manson Passey, OA 04/13/23 1:25 PM   Karie Chimera, M.D., Ph.D. Diseases & Surgery of the Retina and Vitreous [facility] 04/13/2023   I have reviewed the above documentation for accuracy and completeness, and I agree with the above. Karie Chimera, M.D., Ph.D. 04/13/23 1:27 PM    Abbreviations: M myopia (nearsighted); A astigmatism; H hyperopia (farsighted); P presbyopia; Mrx spectacle prescription;  CTL contact lenses; OD right eye; OS left eye; OU both eyes  XT exotropia; ET esotropia; PEK punctate epithelial keratitis; PEE punctate epithelial erosions; DES dry eye syndrome; MGD meibomian gland dysfunction; ATs artificial tears; PFAT's preservative free artificial tears; NSC nuclear sclerotic cataract; PSC posterior subcapsular cataract; ERM epi-retinal membrane; PVD posterior vitreous detachment; RD retinal detachment; DM diabetes mellitus; DR diabetic retinopathy; NPDR non-proliferative diabetic retinopathy; PDR proliferative diabetic retinopathy; CSME clinically significant macular edema; DME diabetic macular edema; dbh dot blot hemorrhages; CWS cotton wool spot; POAG primary open angle glaucoma; C/D cup-to-disc ratio; HVF humphrey visual field; GVF goldmann visual field; OCT optical coherence tomography; IOP intraocular pressure; BRVO Branch retinal vein occlusion; CRVO central retinal vein occlusion; CRAO central retinal artery occlusion; BRAO branch retinal artery occlusion;  RT retinal tear; SB scleral buckle; PPV pars plana vitrectomy; VH Vitreous hemorrhage; PRP panretinal laser photocoagulation; IVK intravitreal kenalog; VMT vitreomacular traction; MH  Macular hole;  NVD neovascularization of the disc; NVE neovascularization elsewhere; AREDS age related eye disease study; ARMD age related macular degeneration; POAG primary open angle glaucoma; EBMD epithelial/anterior basement membrane dystrophy; ACIOL anterior chamber intraocular lens; IOL intraocular lens; PCIOL posterior chamber intraocular lens; Phaco/IOL phacoemulsification with intraocular lens placement; PRK photorefractive keratectomy; LASIK laser assisted in situ keratomileusis; HTN hypertension; DM diabetes mellitus; COPD chronic obstructive pulmonary disease

## 2023-04-13 ENCOUNTER — Ambulatory Visit (INDEPENDENT_AMBULATORY_CARE_PROVIDER_SITE_OTHER): Payer: Medicare (Managed Care) | Admitting: Ophthalmology

## 2023-04-13 ENCOUNTER — Encounter (INDEPENDENT_AMBULATORY_CARE_PROVIDER_SITE_OTHER): Payer: Self-pay | Admitting: Ophthalmology

## 2023-04-13 DIAGNOSIS — H353124 Nonexudative age-related macular degeneration, left eye, advanced atrophic with subfoveal involvement: Secondary | ICD-10-CM

## 2023-04-13 DIAGNOSIS — H35033 Hypertensive retinopathy, bilateral: Secondary | ICD-10-CM

## 2023-04-13 DIAGNOSIS — H353211 Exudative age-related macular degeneration, right eye, with active choroidal neovascularization: Secondary | ICD-10-CM | POA: Diagnosis not present

## 2023-04-13 DIAGNOSIS — I10 Essential (primary) hypertension: Secondary | ICD-10-CM

## 2023-04-13 DIAGNOSIS — Z961 Presence of intraocular lens: Secondary | ICD-10-CM

## 2023-04-13 MED ORDER — BEVACIZUMAB CHEMO INJECTION 1.25MG/0.05ML SYRINGE FOR KALEIDOSCOPE
1.2500 mg | INTRAVITREAL | Status: AC | PRN
  Administered 2023-04-13: 1.25 mg via INTRAVITREAL

## 2023-05-16 NOTE — Progress Notes (Signed)
 [facility] - Clinic Note  05/18/2023     CHIEF COMPLAINT Patient presents for Retina Follow Up   HISTORY OF PRESENT ILLNESS: [name] is a 89 y.o. female who presents to the clinic today for:   HPI     Retina Follow Up   Patient presents with  Wet AMD.  In right eye.  This started 4 years ago.  Duration of 5 weeks.  Since onset it is gradually worsening.  I, the attending physician,  performed the HPI with the patient and updated documentation appropriately.        Comments   Pt presents for 5 wk retina eval, ARMD OD. Patient states that the vision is getting worse, she used to be able to read the captions on the tv but in the past month. Pt is consistent w/ refresh bid OU and Latanoprost  OS at bedtime. She does not check her blood sugar and does not know what her last A1c was.      Last edited by Ronelle Coffee, MD on 05/18/2023 12:46 PM.    Pt states feels like the vision overall is worse today, she states she can't see the TV very well  Referring physician: No referring provider defined for this encounter.  HISTORICAL INFORMATION:   Selected notes from the MEDICAL RECORD NUMBER [name] -- transferring care due to insurance LEE: 09.07.23 -- BCVA CF OU Ocular Hx- ex ARMD OD -- s/p multiple IVA, last IVA OD 09.07.24 (4+ mos) PMH-    CURRENT MEDICATIONS: Current Outpatient Medications (Ophthalmic Drugs)  Medication Sig   carboxymethylcellulose (REFRESH PLUS) 0.5 % SOLN Place 1 drop into both eyes 3 (three) times daily as needed (dry/irritated eyes.).   latanoprost  (XALATAN ) 0.005 % ophthalmic solution Place 1 drop into the left eye at bedtime.   No current facility-administered medications for this visit. (Ophthalmic Drugs)   Current Outpatient Medications (Other)  Medication Sig   acetaminophen  (TYLENOL ) 500 MG tablet Take 1 tablet (500 mg total) by mouth every 6 (six) hours as needed for mild pain, fever or headache.   Alpha-Lipoic Acid  600 MG TABS Take 600 mg by mouth daily. For neuropathy in legs (Patient taking differently: Take 100 mg by mouth daily.)   amiodarone  (PACERONE ) 200 MG tablet Take 1 tablet (200 mg total) by mouth daily.   amLODipine  (NORVASC ) 5 MG tablet Take 5 mg by mouth daily.   aspirin  EC 81 MG EC tablet Take 1 tablet (81 mg total) by mouth daily. Swallow whole.   cephALEXin  (KEFLEX ) 500 MG capsule Take 1 capsule (500 mg total) by mouth 4 (four) times daily.   docusate sodium  (COLACE) 100 MG capsule Take 1 capsule (100 mg total) by mouth daily.   ELIQUIS  2.5 MG TABS tablet TAKE (1) TABLET TWICE A DAY. (Patient taking differently: Take 2.5 mg by mouth 2 (two) times daily.)   feeding supplement (ENSURE ENLIVE / ENSURE PLUS) LIQD Take 237 mLs by mouth 2 (two) times daily between meals.   gabapentin  (NEURONTIN ) 100 MG capsule Take 1 capsule (100 mg total) by mouth at bedtime.   isosorbide  mononitrate (IMDUR ) 60 MG 24 hr tablet Take 1 tablet (60 mg total) by mouth daily.   levothyroxine  (SYNTHROID ) 112 MCG tablet Take 112 mcg by mouth daily.   MELATONIN PO Take 10 mg by mouth at bedtime.   Multiple Vitamins-Minerals (CENTRUM SILVER ULTRA WOMENS PO) Take 1 tablet by mouth every morning.   nitroGLYCERIN  (NITROSTAT ) 0.4 MG SL  tablet PLACE 1 TABLET UNDER THE TONGUE AT ONSET OF CHEST PAIN EVERY 5 MINTUES UP TO 3 TIMES AS NEEDED   pantoprazole  (PROTONIX ) 40 MG tablet Take 1 tablet (40 mg total) by mouth 2 (two) times daily before a meal.   polyethylene glycol (MIRALAX  / GLYCOLAX ) 17 g packet Take 17 g by mouth daily as needed for mild constipation.   rosuvastatin  (CRESTOR ) 10 MG tablet TAKE 1 TABLET DAILY   simethicone  (MYLICON) 125 MG chewable tablet Chew 125 mg by mouth every 6 (six) hours as needed for flatulence.   metoprolol  succinate (TOPROL -XL) 25 MG 24 hr tablet Take 0.5 tablets (12.5 mg total) by mouth daily. Take with or immediately following a meal. (Patient taking differently: Take 12.5 mg by mouth 2 (two)  times daily. Take with or immediately following a meal.)   No current facility-administered medications for this visit. (Other)   REVIEW OF SYSTEMS: ROS   Positive for: Cardiovascular, Eyes Negative for: Constitutional, Gastrointestinal, Neurological, Skin, Genitourinary, Musculoskeletal, HENT, Endocrine, Respiratory, Psychiatric, Allergic/Imm, Heme/Lymph Last edited by Carrington Clack, COT on 05/18/2023 12:31 PM.     ALLERGIES Allergies  Allergen Reactions   Sotalol Other (See Comments)    Torsades    Tramadol  Other (See Comments)    Hallucination, bad-dream   Actos [Pioglitazone] Swelling   Adhesive [Tape] Other (See Comments)    Causes sores   Atorvastatin  Other (See Comments)    myalgia   Bactrim  [Sulfamethoxazole -Trimethoprim ] Itching and Rash    Itchy rash   Ciprofloxacin  Nausea Only   Contrast Media [Iodinated Contrast Media] Other (See Comments)    Headache (no action/pre-med required)   Latex Rash   Pedi-Pre Tape Spray [Wound Dressing Adhesive] Other (See Comments)    Causes sores   Sulfa  Antibiotics Other (See Comments)    Causes sores   PAST MEDICAL HISTORY Past Medical History:  Diagnosis Date   AICD (automatic cardioverter/defibrillator) present    Allergy    SESONAL   Aneurysm of infrarenal abdominal aorta (HCC)    Anxiety    ARTHRITIS    Arthritis    Atrial fibrillation (HCC)    CAD (coronary artery disease)    a. history of cardiac arrest 1993;  b. s/p LAD/LCX stenting in 2003;  c. 07/2011 Cath: LM nl, LAD patent stent, D1 80ost, LCX patent stent, RCA min irregs;  d. 04/2012 MV: EF 66%, no ishcemia;  e. 12/2013 Echo: Ef 55%, no rwma, Gr 1 DD, triv AI/MR, mildly dil LA;  f. 12/2013 Lexi MV: intermediate risk - apical ischemia and inf/infsept fixed defect, ? artifactual.   CAD in native artery 12/31/2013   Previous stents to LAD and Ramus patent on cath today 12/31/13 also There is severe disease in the ostial first diagonal which is unchanged from most  recent cardiac catheterization. The right coronary artery could not be engaged selectively but nonselective angiography showed no significant disease in the proximal and midsegment.       Cataract    DENIES   Chronic back pain    Chronic sinus bradycardia    CKD (chronic kidney disease), stage III (HCC)    Clotting disorder (HCC)    DVT   COLITIS 12/02/2007   Qualifier: Diagnosis of  By: Celestia Colander CMA Nonie Beady), Christine Cozier     Diabetes mellitus without complication (HCC)    DENIES   Diverticulosis of colon (without mention of hemorrhage) 2007   Colonoscopy    Esophageal stricture    a. 2012 s/p dil.  Esophagitis, unspecified    a. 2012 EGD   EXTERNAL HEMORRHOIDS    GERD (gastroesophageal reflux disease)    omeprazole   H/O hiatal hernia    Hiatal hernia    a. 2012 EGD.   History of DVT in the past, not on Coumadin now    left  leg   HYPERCHOLESTEROLEMIA    Hypertension    ICD (implantable cardiac defibrillator) in place    a. s/p initial ICD in 1993 in setting of cardiac arrest;  b. 01/2007 gen change: Guidant T135 Vitality DS VR single lead ICD.   Macular degeneration    gets injecton in eye every 5 weeks- last injection - 05/03/2013    Myocardial infarction [facility]) 1993   Near syncope 05/22/2015   Nephrolithiasis, just saw Dr Inga Manges- "OK"    Orthostatic hypotension    Peripheral vascular disease (HCC)    ???   RECTAL BLEEDING 12/03/2007   Qualifier: Diagnosis of  By: Adan Holms MD Eveline Hipps R    Unspecified gastritis and gastroduodenitis without mention of hemorrhage    a. 2003 EGD->not noted on 2012 EGD.   Unstable angina, neg MI, cath stable.maybe GI 12/30/2013   Past Surgical History:  Procedure Laterality Date   BREAST BIOPSY Left 11/2013   CARDIAC CATHETERIZATION     CARDIAC CATHETERIZATION  01/01/15   patent stents -there is severe disease in ostial 1st diag but without change.    CARDIAC DEFIBRILLATOR PLACEMENT  02/05/2007   Guidant   CARDIOVASCULAR STRESS TEST   12/30/2013   abnormal   CARDIOVERSION N/A 02/13/2019   Procedure: CARDIOVERSION;  Surgeon: Luana Rumple, MD;  Location: [facility] ENDOS[facility];  Service: Cardiovascular;  Laterality: N/A;   CATARACT EXTRACTION Right    Dr. Candi Chafe   COLONOSCOPY     COLONOSCOPY WITH PROPOFOL  N/A 03/30/2020   Procedure: COLONOSCOPY WITH PROPOFOL ;  Surgeon: Urban Garden, MD;  Location: [facility];  Service: Gastroenterology;  Laterality: N/A;   coronary stents      CYSTOSCOPY WITH URETEROSCOPY Right 05/20/2013   Procedure: CYSTOSCOPY, RIGHT URETEROSCOPY STONE EXTRACTION, Insertion of right DOUBLE J STENT ;  Surgeon: Homero Luster, MD;  Location: [facility] ORS;  Service: Urology;  Laterality: Right;   CYSTOSCOPY WITH URETEROSCOPY AND STENT PLACEMENT N/A 06/03/2013   Procedure: SECOND LOOK CYSTOSCOPY WITH URETEROSCOPY  HOLMIUM LASER LITHO AND STONE EXTRACTION Hoover Luz ;  Surgeon: Willye Harvey, MD;  Location: [facility];  Service: Urology;  Laterality: N/A;   ESOPHAGOGASTRODUODENOSCOPY (EGD) WITH PROPOFOL  N/A 11/20/2019   Procedure: ESOPHAGOGASTRODUODENOSCOPY (EGD) WITH PROPOFOL ;  Surgeon: Suzette Espy, MD;  Location: [facility];  Service: Endoscopy;  Laterality: N/A;   GIVENS CAPSULE STUDY N/A 10/30/2016   Procedure: GIVENS CAPSULE STUDY;  Surgeon: Tobin Forts, MD;  Location: [facility] ENDOSCOPY;  Service: Endoscopy;  Laterality: N/A;  NEEDS TO BE ADMITTED FOR OBSERVATION PT. HAS DEFIB   HEMORROIDECTOMY  80's   HOLMIUM LASER APPLICATION Right 05/20/2013   Procedure: HOLMIUM LASER APPLICATION;  Surgeon: Homero Luster, MD;  Location: [facility];  Service: Urology;  Laterality: Right;   HYSTEROSCOPY WITH D & C  09/02/2010   Procedure: DILATATION AND CURETTAGE (D&C) /HYSTEROSCOPY;  Surgeon: Piedad Brewer;  Location: [facility] OR[facility];  Service: Gynecology;  Laterality: N/A;  Dilation and Curettage with Hysteroscopy and Polypectomy   IMPLANTABLE CARDIOVERTER DEFIBRILLATOR (ICD) GENERATOR CHANGE N/A 02/10/2014   Procedure: ICD GENERATOR CHANGE;   Surgeon: Luana Rumple, MD;  Location: [facility] CATH LAB;  Service: Cardiovascular;  Laterality: N/A;  INTRAMEDULLARY (IM) NAIL INTERTROCHANTERIC Left 09/09/2022   Procedure: INTRAMEDULLARY (IM) NAIL INTERTROCHANTERIC;  Surgeon: Claiborne Crew, MD;  Location: [facility];  Service: Orthopedics;  Laterality: Left;   LEFT HEART CATHETERIZATION WITH CORONARY ANGIOGRAM N/A 07/28/2011   Procedure: LEFT HEART CATHETERIZATION WITH CORONARY ANGIOGRAM;  Surgeon: Avanell Leigh, MD;  Location[facility] CATH LAB;  Service: Cardiovascular;  Laterality: N/A;   LEFT HEART CATHETERIZATION WITH CORONARY ANGIOGRAM N/A 12/31/2013   Procedure: LEFT HEART CATHETERIZATION WITH CORONARY ANGIOGRAM;  Surgeon: Wenona Hamilton, MD;  Location: [facility] CATH LAB;  Service: Cardiovascular;  Laterality: N/A;   FAMILY HISTORY Family History  Problem Relation Age of Onset   Stroke Mother    Other Mother        brain tumor   Other Father        MI   Heart attack Father    Stroke Sister    Macular degeneration Sister    Diabetes Daughter    Cancer Daughter        ovarian   Colon polyps Daughter    Atrial fibrillation Sister    Hyperlipidemia Sister    Osteoporosis Sister    Stroke Sister    Uterine cancer Sister    Heart attack Brother    Heart disease Brother    Asthma Brother    Breast cancer Neg Hx    SOCIAL HISTORY Social History   Tobacco Use   Smoking status: Never   Smokeless tobacco: Never  Vaping Use   Vaping status: Never Used  Substance Use Topics   Alcohol  use: No   Drug use: No       OPHTHALMIC EXAM:  Base Eye Exam     Visual Acuity (Snellen - Linear)       Right Left   Dist cc 20/100 -2 20/200 +2    Correction: Glasses         Tonometry (Tonopen, 12:51 PM)       Right Left   Pressure 15 15         Pupils       Pupils Dark Light Shape React APD   Right PERRL 3 2 Round Minimal None   Left PERRL 3 2 Round Minimal None         Visual Fields       Left Right    Full Full          Extraocular Movement       Right Left    Full, Ortho Full, Ortho         Neuro/Psych     Oriented x3: Yes   Mood/Affect: Normal         Dilation     Both eyes: 1.0% Mydriacyl, 2.5% Phenylephrine  @ 12:51 PM           Slit Lamp and Fundus Exam     Slit Lamp Exam       Right Left   Lids/Lashes Dermatochalasis - upper lid Dermatochalasis - upper lid   Conjunctiva/Sclera White and quiet White and quiet   Cornea 1-2+ Punctate epithelial erosions, well healed cataract wound 2+ fine Punctate epithelial erosions, well healed cataract wound   Anterior Chamber deep and clear deep and clear   Iris Round and dilated Round and dilated   Lens PC IOL in good position PC IOL in good position   Anterior Vitreous mild syneresis, Posterior vitreous detachment, Vitreous condensations mild syneresis, silicone oil microbubbles, PVD  Fundus Exam       Right Left   Disc mild Pallor, Sharp rim, temporal PPA mild Pallor, Sharp rim, temporal PPA   C/D Ratio 0.1 0.1   Macula Blunted foveal reflex, central GA with +CNV and IRH centrally Drusen, RPE mottling and clumping, IRH persistent Blunted foveal reflex, large area of GA centrally, Drusen, RPE mottling, clumping and atrophy, no heme   Vessels attenuated, Tortuous attenuated, mild tortuosity   Periphery Attached, reticular degeneration, No heme Attached, reticular degeneration, No heme           Refraction     Wearing Rx       Sphere Cylinder Axis Add   Right -1.00 +1.25 160 +2.25   Left -1.00 +0.75 150 +2.25           IMAGING AND PROCEDURES  Imaging and Procedures for 05/18/2023  OCT, Retina - OU - Both Eyes       Right Eye Quality was good. Central Foveal Thickness: 273. Progression has been stable. Findings include normal foveal contour, no IRF, no SRF, retinal drusen , subretinal hyper-reflective material, pigment epithelial detachment, outer retinal atrophy (Central ORA with scattered PED / [facility] --  PED/SRHM stably improved nasal fovea, PED temporal macula -- stable).   Left Eye Quality was good. Central Foveal Thickness: 212. Progression has been stable. Findings include normal foveal contour, no IRF, no SRF, retinal drusen , subretinal hyper-reflective material, outer retinal atrophy (Central ORA with SRHM; no fluid).   Notes *Images captured and stored on drive  Diagnosis / Impression:  OD: Exudative ARMD - Central ORA with scattered PED / [facility] -- PED/SRHM stably improved nasal fovea, PED temporal macula -- stable OS: Nonexudative ARMD - Central ORA with SRHM; no fluid  Clinical management:  See below  Abbreviations: NFP - Normal foveal profile. CME - cystoid macular edema. PED - pigment epithelial detachment. IRF - intraretinal fluid. SRF - subretinal fluid. EZ - ellipsoid zone. ERM - epiretinal membrane. ORA - outer retinal atrophy. ORT - outer retinal tubulation. SRHM - subretinal hyper-reflective material. IRHM - intraretinal hyper-reflective material      Intravitreal Injection, Pharmacologic Agent - OD - Right Eye       Time Out 05/18/2023. 1:08 PM. Confirmed correct patient, procedure, site, and patient consented.   Anesthesia Topical anesthesia was used. Anesthetic medications included Lidocaine  2%, Proparacaine 0.5%.   Procedure Preparation included 5% betadine  to ocular surface, eyelid speculum. A (32g) needle was used.   Injection: 2 mg aflibercept 2 MG/0.05ML   Route: Intravitreal, Site: Right Eye   NDC: [id number], Lot: [id number], Expiration date: 05/14/2024, Waste: 0 mL   Post-op Post injection exam found visual acuity of at least counting fingers. The patient tolerated the procedure well. There were no complications. The patient received written and verbal post procedure care education. Post injection medications were not given.            ASSESSMENT/PLAN:    ICD-10-CM   1. Exudative age-related macular degeneration of right eye with active  choroidal neovascularization (HCC)  H35.3211 OCT, Retina - OU - Both Eyes    Intravitreal Injection, Pharmacologic Agent - OD - Right Eye    aflibercept (EYLEA) SOLN 2 mg    2. Advanced atrophic nonexudative age-related macular degeneration of left eye with subfoveal involvement  H35.3124     3. Essential hypertension  I10     4. Hypertensive retinopathy of both eyes  H35.033     5. Pseudophakia, both eyes  Z96.1  1. Exudative age related macular degeneration, right eye  - delayed f/u - 5.9 wks instead of 4 due to weather/snow on 02.24.25 - delayed follow up from 8 weeks to 5 months (July 18, 2022 to December 25, 2022) due to hip fracture, sx and rehab - previously managed by Dr. Seward Dao - last visit and injection w/ [facility] was 09.07.2023 (IVA OD) -- pt was on a q6-8 wk maintenance schedule -- presented at 4+ mos since last IVA OD w/ [facility] - s/p IVA OD #1 (01.30.24), #2 (03.12.24), #3 (05.07.24), #4 (07.02.24), #5 (12.09.24), #6 (01.14.25), #7 (02.24.25), #8 (03.28.25) -- IVA resistance  - BCVA OD stable at 20/150   - OCT OD: Central ORA with scattered PED / [facility] -- PED/SRHM slightly increased nasal fovea, PED temporal macula slightly improved, trace cystic changes temp fovea -- stably improved at 4 weeks  - exam shows persistent IRH temporal macula **discussed decreased efficacy / resistance to Avastin  and potential benefit of switching from Avastin  to [facility]** - recommend switching to IVE OD #1 today, 05.02 25 for persistent hemorrhage with follow up in 5 wks  - pt wishes to proceed with injection  - RBA of procedure discussed, questions answered - IVE informed consent obtained and signed 01.30.24 - see procedure note  - f/u in 5 wks, DFE, OCT, possible injxn  2. Age related macular degeneration, non-exudative, left eye  - advanced stage with large central area of GA  - BCVA stable at from 20/150 - The incidence, anatomy, and pathology of dry AMD, risk of progression, and the  AREDS and AREDS 2 study including smoking risks discussed with patient.  - Recommend amsler grid monitoring  3,4. Hypertensive retinopathy OU - discussed importance of tight BP control - continue to monitor  5. Pseudophakia OU  - s/p CE/IOL OU (Dr. Anthoney Kipper)  - IOLs in good position, doing well  - continue to monitor  Ophthalmic Meds Ordered this visit:  Meds ordered this encounter  Medications   aflibercept (EYLEA) SOLN 2 mg     Return for f/u 4-5 weeks, exu ARMD OU, DFE, OCT, Possible Injxn.  There are no Patient Instructions on file for this visit.  This document serves as a record of services personally performed by Jeanice Millard, MD, PhD. It was created on their behalf by Eller Gut COT, an ophthalmic technician. The creation of this record is the provider's dictation and/or activities during the visit.    Electronically signed by: Eller Gut COT 04.30.25 4:11 PM  Jeanice Millard, M.D., Ph.D. Diseases & Surgery of the Retina and Vitreous [facility] [address] [facility] 05/18/2023   I have reviewed the above documentation for accuracy and completeness, and I agree with the above. Jeanice Millard, M.D., Ph.D. 05/20/23 4:15 PM   Abbreviations: M myopia (nearsighted); A astigmatism; H hyperopia (farsighted); P presbyopia; Mrx spectacle prescription;  CTL contact lenses; OD right eye; OS left eye; OU both eyes  XT exotropia; ET esotropia; PEK punctate epithelial keratitis; PEE punctate epithelial erosions; DES dry eye syndrome; MGD meibomian gland dysfunction; ATs artificial tears; PFAT's preservative free artificial tears; NSC nuclear sclerotic cataract; PSC posterior subcapsular cataract; ERM epi-retinal membrane; PVD posterior vitreous detachment; RD retinal detachment; DM diabetes mellitus; DR diabetic retinopathy; NPDR non-proliferative diabetic retinopathy; PDR proliferative diabetic retinopathy; CSME clinically significant macular edema; DME diabetic  macular edema; dbh dot blot hemorrhages; CWS cotton wool spot; POAG primary open angle glaucoma; C/D cup-to-disc ratio; HVF humphrey visual field; GVF goldmann  visual field; OCT optical coherence tomography; IOP intraocular pressure; BRVO Branch retinal vein occlusion; CRVO central retinal vein occlusion; CRAO central retinal artery occlusion; BRAO branch retinal artery occlusion; RT retinal tear; SB scleral buckle; PPV pars plana vitrectomy; VH Vitreous hemorrhage; PRP panretinal laser photocoagulation; IVK intravitreal kenalog ; VMT vitreomacular traction; MH Macular hole;  NVD neovascularization of the disc; NVE neovascularization elsewhere; AREDS age related eye disease study; ARMD age related macular degeneration; POAG primary open angle glaucoma; EBMD epithelial/anterior basement membrane dystrophy; ACIOL anterior chamber intraocular lens; IOL intraocular lens; PCIOL posterior chamber intraocular lens; Phaco/IOL phacoemulsification with intraocular lens placement; PRK photorefractive keratectomy; LASIK laser assisted in situ keratomileusis; HTN hypertension; DM diabetes mellitus; COPD chronic obstructive pulmonary disease

## 2023-05-18 ENCOUNTER — Ambulatory Visit (INDEPENDENT_AMBULATORY_CARE_PROVIDER_SITE_OTHER): Payer: Medicare (Managed Care) | Admitting: Ophthalmology

## 2023-05-18 ENCOUNTER — Encounter (INDEPENDENT_AMBULATORY_CARE_PROVIDER_SITE_OTHER): Payer: Self-pay | Admitting: Ophthalmology

## 2023-05-18 DIAGNOSIS — I10 Essential (primary) hypertension: Secondary | ICD-10-CM

## 2023-05-18 DIAGNOSIS — H35033 Hypertensive retinopathy, bilateral: Secondary | ICD-10-CM | POA: Diagnosis not present

## 2023-05-18 DIAGNOSIS — H353124 Nonexudative age-related macular degeneration, left eye, advanced atrophic with subfoveal involvement: Secondary | ICD-10-CM | POA: Diagnosis not present

## 2023-05-18 DIAGNOSIS — H353211 Exudative age-related macular degeneration, right eye, with active choroidal neovascularization: Secondary | ICD-10-CM

## 2023-05-18 DIAGNOSIS — Z961 Presence of intraocular lens: Secondary | ICD-10-CM

## 2023-05-18 MED ORDER — AFLIBERCEPT 2MG/0.05ML IZ SOLN FOR KALEIDOSCOPE
2.0000 mg | INTRAVITREAL | Status: AC | PRN
  Administered 2023-05-18: 2 mg via INTRAVITREAL

## 2023-05-24 ENCOUNTER — Ambulatory Visit: Payer: Medicare (Managed Care) | Attending: Cardiovascular Disease | Admitting: Cardiovascular Disease

## 2023-05-24 ENCOUNTER — Encounter: Payer: Self-pay | Admitting: Cardiovascular Disease

## 2023-05-24 VITALS — BP 145/60 | HR 56 | Ht 63.0 in | Wt 109.4 lb

## 2023-05-24 DIAGNOSIS — R001 Bradycardia, unspecified: Secondary | ICD-10-CM | POA: Diagnosis not present

## 2023-05-24 DIAGNOSIS — N1832 Chronic kidney disease, stage 3b: Secondary | ICD-10-CM

## 2023-05-24 DIAGNOSIS — I951 Orthostatic hypotension: Secondary | ICD-10-CM

## 2023-05-24 DIAGNOSIS — Z79899 Other long term (current) drug therapy: Secondary | ICD-10-CM

## 2023-05-24 DIAGNOSIS — I25118 Atherosclerotic heart disease of native coronary artery with other forms of angina pectoris: Secondary | ICD-10-CM | POA: Diagnosis not present

## 2023-05-24 DIAGNOSIS — I480 Paroxysmal atrial fibrillation: Secondary | ICD-10-CM

## 2023-05-24 DIAGNOSIS — Z95810 Presence of automatic (implantable) cardiac defibrillator: Secondary | ICD-10-CM

## 2023-05-24 DIAGNOSIS — T50905D Adverse effect of unspecified drugs, medicaments and biological substances, subsequent encounter: Secondary | ICD-10-CM

## 2023-05-24 DIAGNOSIS — E7800 Pure hypercholesterolemia, unspecified: Secondary | ICD-10-CM

## 2023-05-24 DIAGNOSIS — E038 Other specified hypothyroidism: Secondary | ICD-10-CM

## 2023-05-24 DIAGNOSIS — E119 Type 2 diabetes mellitus without complications: Secondary | ICD-10-CM

## 2023-05-24 DIAGNOSIS — Z5181 Encounter for therapeutic drug level monitoring: Secondary | ICD-10-CM

## 2023-05-24 DIAGNOSIS — D6869 Other thrombophilia: Secondary | ICD-10-CM

## 2023-05-24 DIAGNOSIS — I4729 Other ventricular tachycardia: Secondary | ICD-10-CM

## 2023-05-24 LAB — LIPID PANEL

## 2023-05-24 MED ORDER — NITROGLYCERIN 0.4 MG SL SUBL: 0.4000 mg | SUBLINGUAL_TABLET | SUBLINGUAL | 3 refills | Status: DC | PRN

## 2023-05-24 NOTE — Progress Notes (Signed)
 . Patient ID: [name], female   DOB: [birth year], 89 y.o.   MRN: [id number]    Cardiology Office Note    Date:  05/24/2023   ID:  [name], DOB 1931/09/14, MRN [id number]  PCP:  [place] Ac[place]., NP  Cardiologist:   Luana Rumple, MD   Chief Complaint  Patient presents with   Irregular Heart Beat     History of Present Illness:  [name] is a 89 y.o. 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 (initial implantation 1993, new ventricular lead 2009, last generator change 2016, [employer]), symptomatic paroxysmal atrial fibrillation,  LBBB, subsequent problems with coronary artery disease requiring stents (LAD and left circumflex 2003, patent stents at coronary angiography in 2006, 2013 and late 2015), normal left ventricular systolic function, orthostatic hypotension, hyperlipidemia, type 2 diabetes mellitus, stage III  chronic kidney disease, mechanical fall with left hip fracture treated with intramedullary nail August 2024  Has no cardiovascular complaints.  Has lost some weight and has difficulty eating.  Sounds like food gets stuck and causes pain if she tries to continue eating.  Her BMI is now less than 20.  Her defibrillator sticks out very prominently in her left anterior chest, with the skin around it still appears healthy and there is no sign of infection.  She has not had any serious falls or any bleeding problems.  Denies palpitations, dizziness, syncope.  Continues to have mild cognitive deficits, no focal neurological events..  Is accompanied today by an aide.  Despite her very low weight, she continues to have metabolic findings consistent with diabetes mellitus, hemoglobin A1c 6.8% without medications.  Lipid profile shows all the parameters are in acceptable range including HDL 80 and LDL 73.  Kidney function tests have worsened some in January creatinine of 1.63, but now back down to 1.23 on labs  performed in March.   Defibrillator interrogation shows normal device function.  As mentioned the device sticks out very prominently in her left chest, but the skin still slides easily over the device and there is no evidence of erythema or impending skin breach.  Estimated gentle Ingevity is 8 years.  All the lead parameters are stable and in nominal range.  She does not require ventricular pacing (single-chamber device programmed with a lower rate limit of 45 bpm).  She has not had any episodes of atrial fibrillation, as far as I can tell from the heart rate histogram distribution.  Remains extremely sedentary with activity less than an hour a day.  Have been no episodes of high ventricular rate and no tachycardia therapies delivered.  Her most recent ECG from 04/06/2023 shows that she once again has a left bundle branch block pattern, even though occasionally she has narrow complex QRS, as was the case on her ECG from November 2024.  We have been trying to avoid ventricular pacing by keeping her lower rate limit low at 45 bpm and decreasing her beta-blocker dose.  She does not have an atrial lead and has declined invasive procedures such as upgrade of her device to a dual-chamber device.  She also reiterates the fact that she does not want to have invasive procedures such as cardiac catheterization.     CT of the abdomen 2022 described a focal dissection of the infrarenal aorta (old).  There was no evidence of acute mesenteric ischemia, but the patient was diagnosed as probably having intestinal angina with microvascular etiology (versus bowel  hypersensitivity), and improvement was reportedly noted with isosorbide  mononitrate 30 mg daily..  Her abdominal pain is a "knotting sensation",  clearly worsened by food and she has developed some degree of food aversion.  She has to eat very small meals.    She was hospitalized in June 2022 with cellulitis related to an ulceration of the left great toe for  about 4 days.  She was seen again in the emergency room with left leg pain (without evidence of recurrent infection) had a CT angiogram of the chest abdomen and pelvis, describing the presence of aortic atherosclerosis and "old appearing focal dissection flap or a penetrating ulcer in the distal abdominal aorta, lumen measures up to 2.1 cm at this level, no acute dissection, no aneurysm".  The focal dissection was also present on the study in March 2022.  The celiac artery, superior mesenteric artery and inferior mesenteric artery are specifically described as patent.  The right renal artery described as irregular, possible fibromuscular dysplasia without mention of actual stenosis.  There was no evidence of hepatic biliary duct dilation or gallstones.  Indeterminant 7 mm hypodense lesion in the neck of the pancreas was described, although this was not seen on the study from March 2022 and February 2020.  She has extensive chronic nephrolithiasis/nephrocalcinosis and T12 and L1 compression fractures which are old  In recent years she has had increasing problems with symptomatic atrial fibrillation.  Although she has a single chamber defibrillator, making it impossible to precisely diagnose the episodes of atrial fibrillation, there is clear evidence of periods of increased ventricular rate from her baseline, likely representing symptomatic atrial fibrillation.  She develops palpitation, chest discomfort, shortness of breath and if the spells are lengthy she eventually develops overt lower extremity edema.  She underwent a cardioversion in January 2021 from atrial fibrillation to normal rhythm, associated with improvement in symptoms.  She has been taking amiodarone  since February.  She was hospitalized with dizziness, orthostatic hypotension and severe hypothyroidism (TSH 56) in July 2021  It is likely she had iatrogenic hypothyroidism due to amiodarone .  Levothyroxine  was started, beta-blocker therapy was  discontinued.  She was on treatment with disopyramide  for years for her history of catecholaminergic polymorphic VT, but this medication had to be gradually discontinued due to side effects, especially orthostatic hypotension and dry mouth.  In the past she developed torsades de pointes with sotalol and diarrhea with quinidine.  She has not had defibrillator discharges since the 1990s.  Had severe COVID-19 infection with septic shock in November 2023, complicated by weight loss.  Had a mechanical fall with left hip fracture and intramedullary nail placement in August 2024.   Past Medical History:  Diagnosis Date   AICD (automatic cardioverter/defibrillator) present    Allergy    SESONAL   Aneurysm of infrarenal abdominal aorta (HCC)    Anxiety    ARTHRITIS    Arthritis    Atrial fibrillation (HCC)    CAD (coronary artery disease)    a. history of cardiac arrest 1993;  b. s/p LAD/LCX stenting in 2003;  c. 07/2011 Cath: LM nl, LAD patent stent, D1 80ost, LCX patent stent, RCA min irregs;  d. 04/2012 MV: EF 66%, no ishcemia;  e. 12/2013 Echo: Ef 55%, no rwma, Gr 1 DD, triv AI/MR, mildly dil LA;  f. 12/2013 Lexi MV: intermediate risk - apical ischemia and inf/infsept fixed defect, ? artifactual.   CAD in native artery 12/31/2013   Previous stents to LAD and Ramus patent on  cath today 12/31/13 also There is severe disease in the ostial first diagonal which is unchanged from most recent cardiac catheterization. The right coronary artery could not be engaged selectively but nonselective angiography showed no significant disease in the proximal and midsegment.       Cataract    DENIES   Chronic back pain    Chronic sinus bradycardia    CKD (chronic kidney disease), stage III (HCC)    Clotting disorder (HCC)    DVT   COLITIS 12/02/2007   Qualifier: Diagnosis of  By: Celestia Colander CMA Nonie Beady), Christine Cozier     Diabetes mellitus without complication (HCC)    DENIES   Diverticulosis of colon (without mention  of hemorrhage) 2007   Colonoscopy    Esophageal stricture    a. 2012 s/p dil.   Esophagitis, unspecified    a. 2012 EGD   EXTERNAL HEMORRHOIDS    GERD (gastroesophageal reflux disease)    omeprazole   H/O hiatal hernia    Hiatal hernia    a. 2012 EGD.   History of DVT in the past, not on Coumadin now    left  leg   HYPERCHOLESTEROLEMIA    Hypertension    ICD (implantable cardiac defibrillator) in place    a. s/p initial ICD in 1993 in setting of cardiac arrest;  b. 01/2007 gen change: Guidant T135 Vitality DS VR single lead ICD.   Macular degeneration    gets injecton in eye every 5 weeks- last injection - 05/03/2013    Myocardial infarction [facility]) 1993   Near syncope 05/22/2015   Nephrolithiasis, just saw Dr Inga Manges- "OK"    Orthostatic hypotension    Peripheral vascular disease (HCC)    ???   RECTAL BLEEDING 12/03/2007   Qualifier: Diagnosis of  By: Adan Holms MD Eveline Hipps R    Unspecified gastritis and gastroduodenitis without mention of hemorrhage    a. 2003 EGD->not noted on 2012 EGD.   Unstable angina, neg MI, cath stable.maybe GI 12/30/2013    Past Surgical History:  Procedure Laterality Date   BREAST BIOPSY Left 11/2013   CARDIAC CATHETERIZATION     CARDIAC CATHETERIZATION  01/01/15   patent stents -there is severe disease in ostial 1st diag but without change.    CARDIAC DEFIBRILLATOR PLACEMENT  02/05/2007   Guidant   CARDIOVASCULAR STRESS TEST  12/30/2013   abnormal   CARDIOVERSION N/A 02/13/2019   Procedure: CARDIOVERSION;  Surgeon: Luana Rumple, MD;  Location: [facility] ENDOS[facility];  Service: Cardiovascular;  Laterality: N/A;   CATARACT EXTRACTION Right    Dr. Candi Chafe   COLONOSCOPY     COLONOSCOPY WITH PROPOFOL  N/A 03/30/2020   Procedure: COLONOSCOPY WITH PROPOFOL ;  Surgeon: Urban Garden, MD;  Location: [facility];  Service: Gastroenterology;  Laterality: N/A;   coronary stents      CYSTOSCOPY WITH URETEROSCOPY Right 05/20/2013   Procedure: CYSTOSCOPY,  RIGHT URETEROSCOPY STONE EXTRACTION, Insertion of right DOUBLE J STENT ;  Surgeon: Homero Luster, MD;  Location: [facility] ORS;  Service: Urology;  Laterality: Right;   CYSTOSCOPY WITH URETEROSCOPY AND STENT PLACEMENT N/A 06/03/2013   Procedure: SECOND LOOK CYSTOSCOPY WITH URETEROSCOPY  HOLMIUM LASER LITHO AND STONE EXTRACTION Hoover Luz ;  Surgeon: Willye Harvey, MD;  Location: [facility] OR[facility];  Service: Urology;  Laterality: N/A;   ESOPHAGOGASTRODUODENOSCOPY (EGD) WITH PROPOFOL  N/A 11/20/2019   Procedure: ESOPHAGOGASTRODUODENOSCOPY (EGD) WITH PROPOFOL ;  Surgeon: Suzette Espy, MD;  Location: [facility];  Service: Endoscopy;  Laterality: N/A;   GIVENS CAPSULE  STUDY N/A 10/30/2016   Procedure: GIVENS CAPSULE STUDY;  Surgeon: Tobin Forts, MD;  Location: [facility] ENDOSCOPY;  Service: Endoscopy;  Laterality: N/A;  NEEDS TO BE ADMITTED FOR OBSERVATION PT. HAS DEFIB   HEMORROIDECTOMY  80's   HOLMIUM LASER APPLICATION Right 05/20/2013   Procedure: HOLMIUM LASER APPLICATION;  Surgeon: Homero Luster, MD;  Location: [facility];  Service: Urology;  Laterality: Right;   HYSTEROSCOPY WITH D & C  09/02/2010   Procedure: DILATATION AND CURETTAGE (D&C) /HYSTEROSCOPY;  Surgeon: Piedad Brewer;  Location: [facility] ORS;  Service: Gynecology;  Laterality: N/A;  Dilation and Curettage with Hysteroscopy and Polypectomy   IMPLANTABLE CARDIOVERTER DEFIBRILLATOR (ICD) GENERATOR CHANGE N/A 02/10/2014   Procedure: ICD GENERATOR CHANGE;  Surgeon: Luana Rumple, MD;  Location: [facility] CATH LAB;  Service: Cardiovascular;  Laterality: N/A;   INTRAMEDULLARY (IM) NAIL INTERTROCHANTERIC Left 09/09/2022   Procedure: INTRAMEDULLARY (IM) NAIL INTERTROCHANTERIC;  Surgeon: Claiborne Crew, MD;  Location: [facility];  Service: Orthopedics;  Laterality: Left;   LEFT HEART CATHETERIZATION WITH CORONARY ANGIOGRAM N/A 07/28/2011   Procedure: LEFT HEART CATHETERIZATION WITH CORONARY ANGIOGRAM;  Surgeon: Avanell Leigh, MD;  Location: [facility] CATH LAB;  Service: Cardiovascular;  Laterality:  N/A;   LEFT HEART CATHETERIZATION WITH CORONARY ANGIOGRAM N/A 12/31/2013   Procedure: LEFT HEART CATHETERIZATION WITH CORONARY ANGIOGRAM;  Surgeon: Wenona Hamilton, MD;  Location: [facility] CATH LAB;  Service: Cardiovascular;  Laterality: N/A;    Current Medications: Outpatient Medications Prior to Visit  Medication Sig Dispense Refill   Alpha-Lipoic Acid 600 MG TABS Take 600 mg by mouth daily. For neuropathy in legs (Patient taking differently: Take 100 mg by mouth daily. For neuropathy in legs) 90 tablet 3   amiodarone  (PACERONE ) 200 MG tablet Take 1 tablet (200 mg total) by mouth daily. 90 tablet 3   amLODipine  (NORVASC ) 5 MG tablet Take 5 mg by mouth daily.     aspirin  EC 81 MG EC tablet Take 1 tablet (81 mg total) by mouth daily. Swallow whole. 30 tablet 11   carboxymethylcellulose (REFRESH PLUS) 0.5 % SOLN Place 1 drop into both eyes 3 (three) times daily as needed (dry/irritated eyes.).     docusate sodium  (COLACE) 100 MG capsule Take 1 capsule (100 mg total) by mouth daily. (Patient taking differently: Take 100 mg by mouth 2 (two) times daily.) 30 capsule 1   ELIQUIS  2.5 MG TABS tablet TAKE (1) TABLET TWICE A DAY. (Patient taking differently: Take 2.5 mg by mouth 2 (two) times daily.) 180 tablet 1   gabapentin  (NEURONTIN ) 100 MG capsule Take 1 capsule (100 mg total) by mouth at bedtime. 30 capsule 0   isosorbide  mononitrate (IMDUR ) 60 MG 24 hr tablet Take 1 tablet (60 mg total) by mouth daily. 30 tablet 11   latanoprost  (XALATAN ) 0.005 % ophthalmic solution Place 1 drop into the left eye at bedtime.     levothyroxine  (SYNTHROID ) 112 MCG tablet Take 112 mcg by mouth daily.     MELATONIN PO Take 5 mg by mouth at bedtime. Take 2 tablets at bedtime     Multiple Vitamins-Minerals (CENTRUM SILVER ULTRA WOMENS PO) Take 1 tablet by mouth every morning.     pantoprazole  (PROTONIX ) 40 MG tablet Take 1 tablet (40 mg total) by mouth 2 (two) times daily before a meal. 180 tablet 2   rosuvastatin  (CRESTOR )  10 MG tablet TAKE 1 TABLET DAILY 90 tablet 0   nitroGLYCERIN  (NITROSTAT ) 0.4 MG SL tablet PLACE 1 TABLET UNDER THE TONGUE AT ONSET OF  CHEST PAIN EVERY 5 MINTUES UP TO 3 TIMES AS NEEDED 25 tablet 2   acetaminophen  (TYLENOL ) 500 MG tablet Take 1 tablet (500 mg total) by mouth every 6 (six) hours as needed for mild pain, fever or headache. (Patient not taking: Reported on 05/24/2023) 30 tablet 0   cephALEXin  (KEFLEX ) 500 MG capsule Take 1 capsule (500 mg total) by mouth 4 (four) times daily. (Patient not taking: Reported on 05/24/2023) 28 capsule 0   feeding supplement (ENSURE ENLIVE / ENSURE PLUS) LIQD Take 237 mLs by mouth 2 (two) times daily between meals. (Patient not taking: Reported on 05/24/2023) 237 mL 12   metoprolol  succinate (TOPROL -XL) 25 MG 24 hr tablet Take 0.5 tablets (12.5 mg total) by mouth daily. Take with or immediately following a meal. (Patient taking differently: Take 12.5 mg by mouth 2 (two) times daily. Take with or immediately following a meal.) 15 tablet 11   polyethylene glycol (MIRALAX  / GLYCOLAX ) 17 g packet Take 17 g by mouth daily as needed for mild constipation. (Patient not taking: Reported on 05/24/2023) 14 each 0   simethicone  (MYLICON) 125 MG chewable tablet Chew 125 mg by mouth every 6 (six) hours as needed for flatulence. (Patient not taking: Reported on 05/24/2023)     No facility-administered medications prior to visit.     Allergies:   Sotalol, Tramadol , Actos [pioglitazone], Adhesive [tape], Atorvastatin , Bactrim  [sulfamethoxazole -trimethoprim ], Ciprofloxacin , Contrast media [iodinated contrast media], Latex, Pedi-pre tape spray [wound dressing adhesive], and Sulfa  antibiotics   Family History:  The patient's family history includes Asthma in her brother; Atrial fibrillation in her sister; Cancer in her daughter; Colon polyps in her daughter; Diabetes in her daughter; Heart attack in her brother and father; Heart disease in her brother; Hyperlipidemia in her sister; Macular  degeneration in her sister; Osteoporosis in her sister; Other in her father and mother; Stroke in her mother, sister, and sister; Uterine cancer in her sister.    PHYSICAL EXAM:   VS:  BP (!) 145/60   Pulse (!) 56   Ht 5\' 3"  (1.6 m)   Wt 49.6 kg   SpO2 91%   BMI 19.38 kg/m       General: Alert, oriented x3, no distress, healthy ICD site.  The device sticks out prominently because of her weight loss.  The overlying skin appears thin but still healthy and does not appear adherent to the device. Head: no evidence of trauma, PERRL, EOMI, no exophtalmos or lid lag, no myxedema, no xanthelasma; normal ears, nose and oropharynx Neck: normal jugular venous pulsations and no hepatojugular reflux; brisk carotid pulses without delay and no carotid bruits Chest: clear to auscultation, no signs of consolidation by percussion or palpation, normal fremitus, symmetrical and full respiratory excursions Cardiovascular: normal position and quality of the apical impulse, regular rhythm, normal first and second heart sounds, no murmurs, rubs or gallops Abdomen: no tenderness or distention, no masses by palpation, no abnormal pulsatility or arterial bruits, normal bowel sounds, no hepatosplenomegaly Extremities: no clubbing, cyanosis or edema; 2+ radial, ulnar and brachial pulses bilaterally; 2+ right femoral, posterior tibial and dorsalis pedis pulses; 2+ left femoral, posterior tibial and dorsalis pedis pulses; no subclavian or femoral bruits Neurological: grossly nonfocal Psych: Normal mood and affect   Wt Readings from Last 3 Encounters:  05/24/23 49.6 kg  04/06/23 47.2 kg  11/30/22 53.3 kg    HYPERTENSION CONTROL Vitals:   05/24/23 0909 05/24/23 1705  BP: (!) 162/62 (!) 145/60    The patient's blood  pressure is elevated above target today.  In order to address the patient's elevated BP: Blood pressure will be monitored at home to determine if medication changes need to be made.        Studies/Labs Reviewed:   EKG: Recent ECG from 04/06/2023 is personally reviewed and shows sinus rhythm, first-degree AV block, left bundle branch block  EKG Interpretation Date/Time:    Ventricular Rate:    PR Interval:    QRS Duration:    QT Interval:    QTC Calculation:   R Axis:      Text Interpretation:          Recent Labs: 09/12/2022: Magnesium 2.2 04/06/2023: ALT 32; BUN 12; Creatinine, Ser 1.23; Hemoglobin 9.9; Platelets 232; Potassium 3.9; Sodium 131   Lipid Panel    Component Value Date/Time   CHOL 150 02/04/2021 0431   CHOL 139 10/23/2018 0947   CHOL 140 07/31/2012 1019   TRIG 45 02/04/2021 0431   TRIG 115 06/07/2015 1111   TRIG 161 (H) 07/31/2012 1019   HDL 72 02/04/2021 0431   HDL 53 10/23/2018 0947   HDL 68 06/07/2015 1111   HDL 43 07/31/2012 1019   CHOLHDL 2.1 02/04/2021 0431   VLDL 9 02/04/2021 0431   LDLCALC 69 02/04/2021 0431   LDLCALC 65 10/23/2018 0947   LDLCALC 65 09/09/2013 0841   LDLCALC 65 07/31/2012 1019     ASSESSMENT:    1. Coronary artery disease of native artery of native heart with stable angina pectoris (HCC)   2. Paroxysmal atrial fibrillation (HCC)   3. Bradycardia, drug induced   4. Encounter for monitoring amiodarone  therapy   5. Catecholaminergic polymorphic ventricular tachycardia (HCC)   6. Acquired thrombophilia (HCC)   7. Stage 3b chronic kidney disease (HCC)   8. Orthostatic hypotension   9. ICD (implantable cardioverter-defibrillator) in place   10. Hypercholesterolemia   11. Diabetes mellitus type 2, diet-controlled (HCC)   12. Other specified hypothyroidism   13. Medication management        PLAN:  In order of problems listed above:  CAD/stable angina: She has not had any problems with angina pectoris in many years.  On treatment with 2 antianginal medications (medication doses are limted by bradycardia and previous orthostatic hypotension issues).  Most recently her angina was related to ventricular  pacing so we decreased the lower rate limit of her device to 45 bpm.  She does not want to undergo cardiac catheterization or any other invasive procedures.  Consequently, it does not make any sense to perform noninvasive study such as nuclear stress tests.  She has had 3 separate stent procedures in the past, but the most recent one was in 2003.  She has undergone cardiac catheterization for chest pain with findings of patent coronary arteries in 2006, 2013 and 2015.  She is not the best candidate for invasive angiography in view of advanced age, diabetes mellitus, abnormal kidney function and need for anticoagulation, all of which place her at increased risk for complications.   AFib: She has a single-chamber ventricular device, it is hard to say with certainty that she has not had any atrial fibrillation, but based on her smooth heart rate histogram distribution, it seems unlikely that she has had any significant events.   On amiodarone  and on appropriate anticoagulation with Eliquis . CHADSVasc 6 (age 71, gender, DM, CAD, hypertension).   Bradycardia: On amiodarone  and a very low-dose of metoprolol  succinate. Avoid excessive bradycardia due to unpleasant palpitations when she  has ventricular pacing.  Usually she complains of palpitations and unpleasant chest discomfort when we perform RV pacing threshold testing, but she does not seem to be bothered by this today. Amiodarone : so far without side effects, had normal TSH a year ago and normal liver function tests and January of this year.  Recheck TSH today. VT: None seen in many years.  This has been entirely abolished since amiodarone  was initiated.  She has not had any therapy from her defibrillator since the early 1990s.  She did have a rather lengthy 25 beat nonsustained VT run in September 2020 when we decreased her beta-blocker dose due to bradycardia.  Eliquis : Suggested for weight in the low body size.  No bleeding complications. CKD 3: Transient  worsening of renal parameters in January, back to her previous baseline lean which is 1.2-1.4, GFR 35-40.  Seeing Dr. Carrolyn Clan. HTN/Orthostatic hypotension: Occasional complaint.  I recommend that we tolerate SBP up to the high 150s, to reduce the risk of falls. ICD: Normal function of her single lead/single-chamber device.  The device sticks out very prominently due to her weight loss, but the site still looks very healthy.  Concerned about risk of skin breach in the future since she has also much weight.  Asked her to promptly report any sign of skin adherence to the underlying device or any other signs of compromise.   HLP: Lipid parameters are very favorable even though her LDL is slightly higher than target at 73.  Recheck labs today, but for the time being continue the same statin prescription. Aortic atherosclerosis: Has a chronic focal dissection of the abdominal aorta.  This is asymptomatic.  Not a candidate for repair and declines invasive procedures.   Cognitive decline: Stable. Short-term memory continues to be a problem, but without any significant deterioration. Her family member, Chenoah Benak is with her and helps out.  She has a Agricultural engineer during the day.  The records state that she had "hallucinations" during her hospitalization stay, probably sundowning/delirium Intestinal angina versus bowel hypersensitivity: Continues to slowly lose weight and now is frankly under nourished with a BMI of 19. DM: Despite being extremely lean her hemoglobin A1c remains in well compensated diabetes mellitus range. Hypothyroidism: On levothyroxine , needs close monitoring while she is also taking amiodarone . Discussions about end-of -life care: She was here with an aide and not a family member today.  We did not renew this discussion.  I have had a discussion with Mauria and her family member Jerryl Morin about the fact that the defibrillator therapies may become undesirable if her physical and cognitive  decline continues.  At some point, she should have a discussion with her family about long-term wishes.  She has already filled out DNR papers.  Reiterated the fact that if her quality of life deteriorates to a point that VT/VF therapies are not warranted, we can turn those therapies off, without compromising the monitoring or pacing features of her device.   Medication Adjustments/Labs and Tests Ordered: Current medicines are reviewed at length with the patient today.  Concerns regarding medicines are outlined above.  Medication changes, Labs and Tests ordered today are listed in the Patient Instructions below. Patient Instructions  Medication Instructions:  No changes *If you need a refill on your cardiac medications before your next appointment, please call your pharmacy*  Lab Work: Lipid panel, CMP, AIC, TSH, Free T4 If you have labs (blood work) drawn today and your tests are completely normal, you will receive your results  only by: [employer] (if you have MyChart) OR A paper copy in the mail If you have any lab test that is abnormal or we need to change your treatment, we will call you to review the results.  Follow-Up: At [facility], you and your health needs are our priority.  As part of our continuing mission to provide you with exceptional heart care, our providers are all part of one team.  This team includes your primary Cardiologist (physician) and Advanced Practice Providers or APPs (Physician Assistants and Nurse Practitioners) who all work together to provide you with the care you need, when you need it.  Your next appointment:   6 month(s)  Provider:   Luana Rumple, MD    We recommend signing up for the patient portal called "MyChart".  Sign up information is provided on this After Visit Summary.  MyChart is used to connect with patients for Virtual Visits (Telemedicine).  Patients are able to view lab/test results, encounter notes, upcoming appointments,  etc.  Non-urgent messages can be sent to your provider as well.   To learn more about what you can do with MyChart, go to [email or web address].         Signed, Luana Rumple, MD  05/24/2023 5:06 PM    [facility] [address] [place], [place], [place]  [address] Phone: [phone]; Fax: [phone]

## 2023-05-24 NOTE — Patient Instructions (Signed)
 Medication Instructions:  No changes *If you need a refill on your cardiac medications before your next appointment, please call your pharmacy*  Lab Work: Lipid panel, CMP, AIC, TSH, Free T4 If you have labs (blood work) drawn today and your tests are completely normal, you will receive your results only by: MyChart Message (if you have MyChart) OR A paper copy in the mail If you have any lab test that is abnormal or we need to change your treatment, we will call you to review the results.  Follow-Up: At [facility], you and your health needs are our priority.  As part of our continuing mission to provide you with exceptional heart care, our providers are all part of one team.  This team includes your primary Cardiologist (physician) and Advanced Practice Providers or APPs (Physician Assistants and Nurse Practitioners) who all work together to provide you with the care you need, when you need it.  Your next appointment:   6 month(s)  Provider:   Luana Rumple, MD    We recommend signing up for the patient portal called "MyChart".  Sign up information is provided on this After Visit Summary.  MyChart is used to connect with patients for Virtual Visits (Telemedicine).  Patients are able to view lab/test results, encounter notes, upcoming appointments, etc.  Non-urgent messages can be sent to your provider as well.   To learn more about what you can do with MyChart, go to [email or web address].

## 2023-05-25 ENCOUNTER — Encounter: Payer: Self-pay | Admitting: Cardiovascular Disease

## 2023-05-25 DIAGNOSIS — E038 Other specified hypothyroidism: Secondary | ICD-10-CM

## 2023-05-25 LAB — COMPREHENSIVE METABOLIC PANEL WITH GFR
ALT: 30 IU/L (ref 0–32)
AST: 40 IU/L (ref 0–40)
Albumin: 3.9 g/dL (ref 3.7–4.7)
Alkaline Phosphatase: 73 IU/L (ref 44–121)
BUN/Creatinine Ratio: 10 — ABNORMAL LOW (ref 12–28)
BUN: 12 mg/dL (ref 8–27)
Bilirubin Total: 0.4 mg/dL (ref 0.0–1.2)
CO2: 23 mmol/L (ref 20–29)
Calcium: 9.4 mg/dL (ref 8.7–10.3)
Chloride: 99 mmol/L (ref 96–106)
Creatinine, Ser: 1.15 mg/dL — ABNORMAL HIGH (ref 0.57–1.00)
Globulin, Total: 2.5 g/dL (ref 1.5–4.5)
Glucose: 114 mg/dL — ABNORMAL HIGH (ref 70–99)
Potassium: 4.2 mmol/L (ref 3.5–5.2)
Sodium: 134 mmol/L (ref 134–144)
Total Protein: 6.4 g/dL (ref 6.0–8.5)
eGFR: 46 mL/min/{1.73_m2} — ABNORMAL LOW (ref >59)

## 2023-05-25 LAB — LIPID PANEL
Chol/HDL Ratio: 1.8 ratio (ref 0.0–4.4)
Cholesterol, Total: 162 mg/dL (ref 100–199)
HDL: 88 mg/dL (ref >39)
LDL Chol Calc (NIH): 63 mg/dL (ref 0–99)
Triglycerides: 51 mg/dL (ref 0–149)
VLDL Cholesterol Cal: 11 mg/dL (ref 5–40)

## 2023-05-25 LAB — TSH+FREE T4
Free T4: 1.45 ng/dL (ref 0.82–1.77)
TSH: 8.69 u[IU]/mL — ABNORMAL HIGH (ref 0.450–4.500)

## 2023-05-25 LAB — HEMOGLOBIN A1C
Est. average glucose Bld gHb Est-mCnc: 140 mg/dL
Hgb A1c MFr Bld: 6.5 % — ABNORMAL HIGH (ref 4.8–5.6)

## 2023-05-25 NOTE — Telephone Encounter (Signed)
 Gave patient the results and information. I asked her what pharmacy she would like her medications sent to, she reports that [facility] handles her medications, she asked me to call back on Monday because her aid will be there until 3 pm.  She also said that I can call her daughter in law to ask. Called her, but there was no answer and voicemail not set up.  I will have to call back on Monday to speak with [facility] health aid.

## 2023-05-28 MED ORDER — LEVOTHYROXINE SODIUM 125 MCG PO TABS: 125.0000 ug | ORAL_TABLET | Freq: Every day | ORAL | 3 refills | Status: DC

## 2023-05-28 NOTE — Telephone Encounter (Signed)
 Spoke with pt's daughter Eveleen Hinds (after trying to call the daughter in law again) Gave Results and recommentations to [place]- New prescription sent to  [facility]   Also told of TSH needed in 3 months. Pt can go to any [facility] or come to our office. She verbalized understanding of all information- she said she will give this information to pt's daughter in law who handles the pt's medication. Gave call back number for any questions or concerns.

## 2023-05-31 ENCOUNTER — Ambulatory Visit: Payer: Self-pay | Admitting: Cardiovascular Disease

## 2023-05-31 ENCOUNTER — Ambulatory Visit (INDEPENDENT_AMBULATORY_CARE_PROVIDER_SITE_OTHER): Payer: Medicare (Managed Care)

## 2023-05-31 DIAGNOSIS — R001 Bradycardia, unspecified: Secondary | ICD-10-CM

## 2023-05-31 LAB — CUP PACEART REMOTE DEVICE CHECK
Battery Remaining Longevity: 90 mo
Battery Remaining Percentage: 77 %
Brady Statistic RV Percent Paced: 0 %
Date Time Interrogation Session: 20250515094800
HighPow Impedance: 32 Ohm
Implantable Lead Connection Status: 753985
Implantable Lead Implant Date: 20090120
Implantable Lead Location: 753860
Implantable Lead Model: 157
Implantable Lead Serial Number: 138237
Implantable Pulse Generator Implant Date: 20160126
Lead Channel Impedance Value: 398 Ohm
Lead Channel Pacing Threshold Amplitude: 1.1 V
Lead Channel Pacing Threshold Pulse Width: 0.5 ms
Lead Channel Setting Pacing Amplitude: 2.2 V
Lead Channel Setting Pacing Pulse Width: 0.5 ms
Lead Channel Setting Sensing Sensitivity: 0.6 mV
Pulse Gen Serial Number: 191956

## 2023-06-12 MED ORDER — NITROGLYCERIN 0.4 MG SL SUBL: 0.4000 mg | SUBLINGUAL_TABLET | SUBLINGUAL | 3 refills | Status: DC | PRN

## 2023-06-12 MED ORDER — LEVOTHYROXINE SODIUM 125 MCG PO TABS: 125.0000 ug | ORAL_TABLET | Freq: Every day | ORAL | 3 refills | Status: DC

## 2023-06-12 NOTE — Telephone Encounter (Signed)
 Levothyroxine  and ntg refill reports have been faxed to [facility] [phone].  Confirmed of receipt received.

## 2023-06-12 NOTE — Addendum Note (Signed)
 Addended by: Kaiel Weide on: 06/12/2023 03:38 PM   Modules accepted: Orders

## 2023-06-21 NOTE — Progress Notes (Signed)
 [facility] - Clinic Note  06/22/2023     CHIEF COMPLAINT Patient presents for Retina Follow Up   HISTORY OF PRESENT ILLNESS: [name] is a 89 y.o. female who presents to the clinic today for:   HPI     Retina Follow Up   Patient presents with  Wet AMD.  In right eye.  Severity is moderate.  Duration of 5 weeks.  Since onset it is gradually worsening.  I, the attending physician,  performed the HPI with the patient and updated documentation appropriately.        Comments   Pt here for 5 wk ret f/u exu ARMD OD. Pt states she does feel like VA in OD is a bit worse, could be her specs. Just blurry VA OD.       Last edited by Renee Coffee, MD on 07/01/2023  2:21 AM.     Pt states feels like the vision overall is worse today, she states she can't see the TV very well  Referring physician: Meta [name]., NP [address] [place],  [place] [address]  HISTORICAL INFORMATION:   Selected notes from the MEDICAL RECORD NUMBER [name] pt -- transferring care due to insurance LEE: 09.07.23 -- BCVA CF OU Ocular Hx- ex ARMD OD -- s/p multiple IVA, last IVA OD 09.07.24 (4+ mos) PMH-    CURRENT MEDICATIONS: Current Outpatient Medications (Ophthalmic Drugs)  Medication Sig   carboxymethylcellulose (REFRESH PLUS) 0.5 % SOLN Place 1 drop into both eyes 3 (three) times daily as needed (dry/irritated eyes.).   latanoprost  (XALATAN ) 0.005 % ophthalmic solution Place 1 drop into the left eye at bedtime.   No current facility-administered medications for this visit. (Ophthalmic Drugs)   Current Outpatient Medications (Other)  Medication Sig   Alpha-Lipoic Acid 600 MG TABS Take 600 mg by mouth daily. For neuropathy in legs (Patient taking differently: Take 100 mg by mouth daily. For neuropathy in legs)   amiodarone  (PACERONE ) 200 MG tablet Take 1 tablet (200 mg total) by mouth daily.   amLODipine  (NORVASC ) 5 MG tablet Take 5 mg by mouth daily.   aspirin  EC 81  MG EC tablet Take 1 tablet (81 mg total) by mouth daily. Swallow whole.   docusate sodium  (COLACE) 100 MG capsule Take 1 capsule (100 mg total) by mouth daily. (Patient taking differently: Take 100 mg by mouth 2 (two) times daily.)   ELIQUIS  2.5 MG TABS tablet TAKE (1) TABLET TWICE A DAY. (Patient taking differently: Take 2.5 mg by mouth 2 (two) times daily.)   gabapentin  (NEURONTIN ) 100 MG capsule Take 1 capsule (100 mg total) by mouth at bedtime.   isosorbide  mononitrate (IMDUR ) 60 MG 24 hr tablet Take 1 tablet (60 mg total) by mouth daily.   levothyroxine  (SYNTHROID ) 125 MCG tablet Take 1 tablet (125 mcg total) by mouth daily.   MELATONIN PO Take 5 mg by mouth at bedtime. Take 2 tablets at bedtime   Multiple Vitamins-Minerals (CENTRUM SILVER ULTRA WOMENS PO) Take 1 tablet by mouth every morning.   nitroGLYCERIN  (NITROSTAT ) 0.4 MG SL tablet Place 1 tablet (0.4 mg total) under the tongue every 5 (five) minutes as needed for chest pain.   pantoprazole  (PROTONIX ) 40 MG tablet Take 1 tablet (40 mg total) by mouth 2 (two) times daily before a meal.   rosuvastatin  (CRESTOR ) 10 MG tablet TAKE 1 TABLET DAILY   acetaminophen  (TYLENOL ) 500 MG tablet Take 1 tablet (500 mg total) by mouth  every 6 (six) hours as needed for mild pain, fever or headache. (Patient not taking: Reported on 05/24/2023)   metoprolol  succinate (TOPROL -XL) 25 MG 24 hr tablet Take 0.5 tablets (12.5 mg total) by mouth daily. Take with or immediately following a meal. (Patient taking differently: Take 12.5 mg by mouth 2 (two) times daily. Take with or immediately following a meal.)   simethicone  (MYLICON) 125 MG chewable tablet Chew 125 mg by mouth every 6 (six) hours as needed for flatulence. (Patient not taking: Reported on 05/24/2023)   No current facility-administered medications for this visit. (Other)   REVIEW OF SYSTEMS: ROS   Positive for: Cardiovascular, Eyes Negative for: Constitutional, Gastrointestinal, Neurological, Skin,  Genitourinary, Musculoskeletal, HENT, Endocrine, Respiratory, Psychiatric, Allergic/Imm, Heme/Lymph Last edited by [name], COT on 06/22/2023 12:58 PM.      ALLERGIES Allergies  Allergen Reactions   Sotalol Other (See Comments)    Torsades    Tramadol  Other (See Comments)    Hallucination, bad-dream   Actos [Pioglitazone] Swelling   Adhesive [Tape] Other (See Comments)    Causes sores   Atorvastatin  Other (See Comments)    myalgia   Bactrim  [Sulfamethoxazole -Trimethoprim ] Itching and Rash    Itchy rash   Ciprofloxacin  Nausea Only   Contrast Media [Iodinated Contrast Media] Other (See Comments)    Headache (no action/pre-med required)   Latex Rash   Pedi-Pre Tape Spray [Wound Dressing Adhesive] Other (See Comments)    Causes sores   Sulfa  Antibiotics Other (See Comments)    Causes sores   PAST MEDICAL HISTORY Past Medical History:  Diagnosis Date   AICD (automatic cardioverter/defibrillator) present    Allergy    SESONAL   Aneurysm of infrarenal abdominal aorta (HCC)    Anxiety    ARTHRITIS    Arthritis    Atrial fibrillation (HCC)    CAD (coronary artery disease)    a. history of cardiac arrest 1993;  b. s/p LAD/LCX stenting in 2003;  c. 07/2011 Cath: LM nl, LAD patent stent, D1 80ost, LCX patent stent, RCA min irregs;  d. 04/2012 MV: EF 66%, no ishcemia;  e. 12/2013 Echo: Ef 55%, no rwma, Gr 1 DD, triv AI/MR, mildly dil LA;  f. 12/2013 Lexi MV: intermediate risk - apical ischemia and inf/infsept fixed defect, ? artifactual.   CAD in native artery 12/31/2013   Previous stents to LAD and Ramus patent on cath today 12/31/13 also There is severe disease in the ostial first diagonal which is unchanged from most recent cardiac catheterization. The right coronary artery could not be engaged selectively but nonselective angiography showed no significant disease in the proximal and midsegment.       Cataract    DENIES   Chronic back pain    Chronic sinus bradycardia     CKD (chronic kidney disease), stage III (HCC)    Clotting disorder (HCC)    DVT   COLITIS 12/02/2007   Qualifier: Diagnosis of  By: Celestia Colander CMA Nonie Beady), Christine Cozier     Diabetes mellitus without complication (HCC)    DENIES   Diverticulosis of colon (without mention of hemorrhage) 2007   Colonoscopy    Esophageal stricture    a. 2012 s/p dil.   Esophagitis, unspecified    a. 2012 EGD   EXTERNAL HEMORRHOIDS    GERD (gastroesophageal reflux disease)    omeprazole   H/O hiatal hernia    Hiatal hernia    a. 2012 EGD.   History of DVT in the past, not on  Coumadin now    left  leg   HYPERCHOLESTEROLEMIA    Hypertension    ICD (implantable cardiac defibrillator) in place    a. s/p initial ICD in 1993 in setting of cardiac arrest;  b. 01/2007 gen change: Guidant T135 Vitality DS VR single lead ICD.   Macular degeneration    gets injecton in eye every 5 weeks- last injection - 05/03/2013    Myocardial infarction [facility]) 1993   Near syncope 05/22/2015   Nephrolithiasis, just saw Dr Inga Manges- [place]    Orthostatic hypotension    Peripheral vascular disease (HCC)    ???   RECTAL BLEEDING 12/03/2007   Qualifier: Diagnosis of  By: Adan Holms MD Eveline Hipps R    Unspecified gastritis and gastroduodenitis without mention of hemorrhage    a. 2003 EGD->not noted on 2012 EGD.   Unstable angina, neg MI, cath stable.maybe GI 12/30/2013   Past Surgical History:  Procedure Laterality Date   BREAST BIOPSY Left 11/2013   CARDIAC CATHETERIZATION     CARDIAC CATHETERIZATION  01/01/15   patent stents -there is severe disease in ostial 1st diag but without change.    CARDIAC DEFIBRILLATOR PLACEMENT  02/05/2007   Guidant   CARDIOVASCULAR STRESS TEST  12/30/2013   abnormal   CARDIOVERSION N/A 02/13/2019   Procedure: CARDIOVERSION;  Surgeon: Luana Rumple, MD;  Location: [facility];  Service: Cardiovascular;  Laterality: N/A;   CATARACT EXTRACTION Right    Dr. Candi Chafe   COLONOSCOPY     COLONOSCOPY WITH PROPOFOL   N/A 03/30/2020   Procedure: COLONOSCOPY WITH PROPOFOL ;  Surgeon: Urban Garden, MD;  Location: [facility];  Service: Gastroenterology;  Laterality: N/A;   coronary stents      CYSTOSCOPY WITH URETEROSCOPY Right 05/20/2013   Procedure: CYSTOSCOPY, RIGHT URETEROSCOPY STONE EXTRACTION, Insertion of right DOUBLE J STENT ;  Surgeon: Homero Luster, MD;  Location: [facility] OR[facility];  Service: Urology;  Laterality: Right;   CYSTOSCOPY WITH URETEROSCOPY AND STENT PLACEMENT N/A 06/03/2013   Procedure: SECOND LOOK CYSTOSCOPY WITH URETEROSCOPY  HOLMIUM LASER LITHO AND STONE EXTRACTION Hoover Luz ;  Surgeon: Willye Harvey, MD;  Location: [facility];  Service: Urology;  Laterality: N/A;   ESOPHAGOGASTRODUODENOSCOPY (EGD) WITH PROPOFOL  N/A 11/20/2019   Procedure: ESOPHAGOGASTRODUODENOSCOPY (EGD) WITH PROPOFOL ;  Surgeon: Suzette Espy, MD;  Location: [facility];  Service: Endoscopy;  Laterality: N/A;   GIVENS CAPSULE STUDY N/A 10/30/2016   Procedure: GIVENS CAPSULE STUDY;  Surgeon: Tobin Forts, MD;  Location: [facility] ENDOSCOPY;  Service: Endoscopy;  Laterality: N/A;  NEEDS TO BE ADMITTED FOR OBSERVATION PT. HAS DEFIB   HEMORROIDECTOMY  80's   HOLMIUM LASER APPLICATION Right 05/20/2013   Procedure: HOLMIUM LASER APPLICATION;  Surgeon: Homero Luster, MD;  Location: [facility];  Service: Urology;  Laterality: Right;   HYSTEROSCOPY WITH D & C  09/02/2010   Procedure: DILATATION AND CURETTAGE (D&C) /HYSTEROSCOPY;  Surgeon: Piedad Brewer;  Location: [facility];  Service: Gynecology;  Laterality: N/A;  Dilation and Curettage with Hysteroscopy and Polypectomy   IMPLANTABLE CARDIOVERTER DEFIBRILLATOR (ICD) GENERATOR CHANGE N/A 02/10/2014   Procedure: ICD GENERATOR CHANGE;  Surgeon: Luana Rumple, MD;  Location: [facility] CATH LAB;  Service: Cardiovascular;  Laterality: N/A;   INTRAMEDULLARY (IM) NAIL INTERTROCHANTERIC Left 09/09/2022   Procedure: INTRAMEDULLARY (IM) NAIL INTERTROCHANTERIC;  Surgeon: Claiborne Crew, MD;  Location: [facility];  Service:  Orthopedics;  Laterality: Left;   LEFT HEART CATHETERIZATION WITH CORONARY ANGIOGRAM N/A 07/28/2011   Procedure: LEFT HEART CATHETERIZATION WITH CORONARY ANGIOGRAM;  Surgeon: Avanell Leigh, MD;  Location: [facility] CATH LAB;  Service: Cardiovascular;  Laterality: N/A;   LEFT HEART CATHETERIZATION WITH CORONARY ANGIOGRAM N/A 12/31/2013   Procedure: LEFT HEART CATHETERIZATION WITH CORONARY ANGIOGRAM;  Surgeon: Wenona Hamilton, MD;  Location: [facility] C[facility] LAB;  Service: Cardiovascular;  Laterality: N/A;   FAMILY HISTORY Family History  Problem Relation Age of Onset   Stroke Mother    Other Mother        brain tumor   Other Father        MI   Heart attack Father    Stroke Sister    Macular degeneration Sister    Diabetes Daughter    Cancer Daughter        ovarian   Colon polyps Daughter    Atrial fibrillation Sister    Hyperlipidemia Sister    Osteoporosis Sister    Stroke Sister    Uterine cancer Sister    Heart attack Brother    Heart disease Brother    Asthma Brother    Breast cancer Neg Hx    SOCIAL HISTORY Social History   Tobacco Use   Smoking status: Never   Smokeless tobacco: Never  Vaping Use   Vaping status: Never Used  Substance Use Topics   Alcohol  use: No   Drug use: No       OPHTHALMIC EXAM:  Base Eye Exam     Visual Acuity (Snellen - Linear)       Right Left   Dist cc 20/100 20/200 -1   Dist ph cc NI NI    Correction: Glasses         Tonometry (Tonopen, 1:06 PM)       Right Left   Pressure 10 14         Pupils       Pupils Dark Light Shape React APD   Right PERRL 3 2 Round Minimal None   Left PERRL 3 2 Round Minimal None         Visual Fields (Counting fingers)       Left Right    Full Full         Extraocular Movement       Right Left    Full, Ortho Full, Ortho         Neuro/Psych     Oriented x3: Yes   Mood/Affect: Normal         Dilation     Both eyes: 1.0% Mydriacyl, 2.5% Phenylephrine  @ 1:07 PM            Slit Lamp and Fundus Exam     Slit Lamp Exam       Right Left   Lids/Lashes Dermatochalasis - upper lid Dermatochalasis - upper lid   Conjunctiva/Sclera White and quiet White and quiet   Cornea 1-2+ Punctate epithelial erosions, well healed cataract wound 2+ fine Punctate epithelial erosions, well healed cataract wound   Anterior Chamber deep and clear deep and clear   Iris Round and dilated Round and dilated   Lens PC IOL in good position PC IOL in good position   Anterior Vitreous mild syneresis, Posterior vitreous detachment, Vitreous condensations mild syneresis, silicone oil microbubbles, PVD         Fundus Exam       Right Left   Disc mild Pallor, Sharp rim, temporal PPA mild Pallor, Sharp rim, temporal PPA   C/D Ratio 0.1 0.1   Macula Blunted foveal reflex, central GA with +  CNV and IRH centrally -- slightly improved, Drusen, RPE mottling and clumping Blunted foveal reflex, large area of GA centrally, Drusen, RPE mottling, clumping and atrophy, no heme   Vessels attenuated, Tortuous attenuated, mild tortuosity   Periphery Attached, reticular degeneration, No heme Attached, reticular degeneration, No heme           Refraction     Wearing Rx       Sphere Cylinder Axis Add   Right -1.00 +1.25 160 +2.25   Left -1.00 +0.75 150 +2.25           IMAGING AND PROCEDURES  Imaging and Procedures for 06/22/2023  OCT, Retina - OU - Both Eyes       Right Eye Quality was good. Central Foveal Thickness: 273. Progression has improved. Findings include normal foveal contour, no IRF, no SRF, retinal drusen , subretinal hyper-reflective material, pigment epithelial detachment, outer retinal atrophy (Central ORA with scattered PED / [facility] -- PED/SRHM nasal fovea and PED temporal macula -- slightly improved).   Left Eye Quality was good. Central Foveal Thickness: 271. Progression has been stable. Findings include normal foveal contour, no IRF, no SRF, retinal drusen , subretinal  hyper-reflective material, outer retinal atrophy (Central ORA with SRHM; no fluid).   Notes *Images captured and stored on drive  Diagnosis / Impression:  OD: Exudative ARMD - Central ORA with scattered PED / [facility] -- PED/SRHM nasal fovea and PED temporal macula -- slightly improved OS: Nonexudative ARMD - Central ORA with SRHM; no fluid  Clinical management:  See below  Abbreviations: NFP - Normal foveal profile. CME - cystoid macular edema. PED - pigment epithelial detachment. IRF - intraretinal fluid. SRF - subretinal fluid. EZ - ellipsoid zone. ERM - epiretinal membrane. ORA - outer retinal atrophy. ORT - outer retinal tubulation. SRHM - subretinal hyper-reflective material. IRHM - intraretinal hyper-reflective material      Intravitreal Injection, Pharmacologic Agent - OD - Right Eye       Time Out 06/22/2023. 12:45 PM. Confirmed correct patient, procedure, site, and patient consented.   Anesthesia Topical anesthesia was used. Anesthetic medications included Lidocaine  2%, Proparacaine 0.5%.   Procedure Preparation included 5% betadine  to ocular surface, eyelid speculum. A (32g) needle was used.   Injection: 2 mg aflibercept  2 MG/0.05ML   Route: Intravitreal, Site: Right Eye   NDC: [id number], Lot: [id number], Expiration date: 09/15/2024, Waste: 0 mL   Post-op Post injection exam found visual acuity of at least counting fingers. The patient tolerated the procedure well. There were no complications. The patient received written and verbal post procedure care education. Post injection medications were not given.             ASSESSMENT/PLAN:    ICD-10-CM   1. Exudative age-related macular degeneration of right eye with active choroidal neovascularization (HCC)  H35.3211 OCT, Retina - OU - Both Eyes    Intravitreal Injection, Pharmacologic Agent - OD - Right Eye    aflibercept  (EYLEA ) SOLN 2 mg    2. Advanced atrophic nonexudative age-related macular degeneration of  left eye with subfoveal involvement  H35.3124     3. Essential hypertension  I10     4. Hypertensive retinopathy of both eyes  H35.033     5. Pseudophakia, both eyes  Z96.1       1. Exudative age related macular degeneration, right eye  - delayed f/u - 5.9 wks instead of 4 due to weather/snow on 02.24.25 - delayed follow up from 8 weeks to 5 months (  July 18, 2022 to December 25, 2022) due to hip fracture, sx and rehab - previously managed by Dr. Seward Dao - last visit and injection w/ [name] was 09.07.2023 (IVA OD) -- pt was on a q6-8 wk maintenance schedule -- presented at 4+ mos since last IVA OD w/ [name] - s/p IVA OD #1 (01.30.24), #2 (03.12.24), #3 (05.07.24), #4 (07.02.24), #5 (12.09.24), #6 (01.14.25), #7 (02.24.25), #8 (03.28.25) -- IVA resistance ================================= - s/p IVE OD (05.02.2025)  - BCVA OD stable at 20/100  - OCT OD shows: Central ORA with scattered PED / [facility] -- PED/SRHM nasal fovea and PED temporal macula -- slightly improved at 4 weeks  - exam shows persistent IRH temporal macula - recommend IVE OD #2 today, 06.05 25 for persistent hemorrhage with follow up in 4-5 wks  - pt wishes to proceed with injection  - RBA of procedure discussed, questions answered - IVE informed consent obtained and signed 01.30.24 - see procedure note  - f/u in 4-5 wks, DFE, OCT, possible injxn  2. Age related macular degeneration, non-exudative, left eye  - advanced stage with large central area of GA  - BCVA stable at from 20/150 - The incidence, anatomy, and pathology of dry AMD, risk of progression, and the AREDS and AREDS 2 [id number] including smoking risks discussed with patient.  - Recommend amsler grid monitoring  3,4. Hypertensive retinopathy OU - discussed importance of tight BP control - continue to monitor  5. Pseudophakia OU  - s/p CE/IOL OU (Dr. Anthoney Kipper)  - IOLs in good position, doing well  - continue to monitor  Ophthalmic Meds Ordered this visit:   Meds ordered this encounter  Medications   aflibercept  (EYLEA ) SOLN 2 mg     Return for f/u 4-5 weeks, exu ARMD OD, DFE, OCT, Possible Injxn.  There are no Patient Instructions on file for this visit.  This document serves as a record of services personally performed by Jeanice Millard, MD, PhD. It was created on their behalf by Eller Gut COT, an ophthalmic technician. The creation of this record is the provider's dictation and/or activities during the visit.    Electronically signed by: Eller Gut COT 06.05.2025  2:23 AM  This document serves as a record of services personally performed by Jeanice Millard, MD, PhD. It was created on their behalf by Morley Arabia. Bevin Bucks, OA an ophthalmic technician. The creation of this record is the provider's dictation and/or activities during the visit.    Electronically signed by: Morley Arabia. Bevin Bucks, OA 07/01/23 2:23 AM  Jeanice Millard, M.D., Ph.D. Diseases & Surgery of the Retina and Vitreous [facility] 06/22/2023   I have reviewed the above documentation for accuracy and completeness, and I agree with the above. Jeanice Millard, M.D., Ph.D. 07/01/23 2:24 AM   Abbreviations: M myopia (nearsighted); A astigmatism; H hyperopia (farsighted); P presbyopia; Mrx spectacle prescription;  CTL contact lenses; OD right eye; OS left eye; OU both eyes  XT exotropia; ET esotropia; PEK punctate epithelial keratitis; PEE punctate epithelial erosions; DES dry eye syndrome; MGD meibomian gland dysfunction; ATs artificial tears; PFAT's preservative free artificial tears; NSC nuclear sclerotic cataract; PSC posterior subcapsular cataract; ERM epi-retinal membrane; PVD posterior vitreous detachment; RD retinal detachment; DM diabetes mellitus; DR diabetic retinopathy; NPDR non-proliferative diabetic retinopathy; PDR proliferative diabetic retinopathy; CSME clinically significant macular edema; DME diabetic macular edema; dbh dot blot  hemorrhages; CWS cotton wool spot; POAG primary open angle glaucoma; C/D cup-to-disc ratio;  HVF humphrey visual field; GVF goldmann visual field; OCT optical coherence tomography; IOP intraocular pressure; BRVO Branch retinal vein occlusion; CRVO central retinal vein occlusion; CRAO central retinal artery occlusion; BRAO branch retinal artery occlusion; RT retinal tear; SB scleral buckle; PPV pars plana vitrectomy; VH Vitreous hemorrhage; PRP panretinal laser photocoagulation; IVK intravitreal kenalog ; VMT vitreomacular traction; MH Macular hole;  NVD neovascularization of the disc; NVE neovascularization elsewhere; AREDS age related eye disease study; ARMD age related macular degeneration; POAG primary open angle glaucoma; EBMD epithelial/anterior basement membrane dystrophy; ACIOL anterior chamber intraocular lens; IOL intraocular lens; PCIOL posterior chamber intraocular lens; Phaco/IOL phacoemulsification with intraocular lens placement; PRK photorefractive keratectomy; LASIK laser assisted in situ keratomileusis; HTN hypertension; DM diabetes mellitus; COPD chronic obstructive pulmonary disease

## 2023-06-22 ENCOUNTER — Ambulatory Visit (INDEPENDENT_AMBULATORY_CARE_PROVIDER_SITE_OTHER): Payer: Medicare (Managed Care) | Admitting: Ophthalmology

## 2023-06-22 ENCOUNTER — Encounter (INDEPENDENT_AMBULATORY_CARE_PROVIDER_SITE_OTHER): Payer: Self-pay | Admitting: Ophthalmology

## 2023-06-22 DIAGNOSIS — I10 Essential (primary) hypertension: Secondary | ICD-10-CM

## 2023-06-22 DIAGNOSIS — H353124 Nonexudative age-related macular degeneration, left eye, advanced atrophic with subfoveal involvement: Secondary | ICD-10-CM

## 2023-06-22 DIAGNOSIS — H35033 Hypertensive retinopathy, bilateral: Secondary | ICD-10-CM

## 2023-06-22 DIAGNOSIS — H353211 Exudative age-related macular degeneration, right eye, with active choroidal neovascularization: Secondary | ICD-10-CM | POA: Diagnosis not present

## 2023-06-22 DIAGNOSIS — Z961 Presence of intraocular lens: Secondary | ICD-10-CM

## 2023-07-01 ENCOUNTER — Encounter (INDEPENDENT_AMBULATORY_CARE_PROVIDER_SITE_OTHER): Payer: Self-pay | Admitting: Ophthalmology

## 2023-07-01 MED ORDER — AFLIBERCEPT 2MG/0.05ML IZ SOLN FOR KALEIDOSCOPE
2.0000 mg | INTRAVITREAL | Status: AC | PRN
  Administered 2023-07-01: 2 mg via INTRAVITREAL

## 2023-07-10 ENCOUNTER — Ambulatory Visit (INDEPENDENT_AMBULATORY_CARE_PROVIDER_SITE_OTHER): Payer: Medicare (Managed Care) | Admitting: Podiatry

## 2023-07-10 DIAGNOSIS — M79674 Pain in right toe(s): Secondary | ICD-10-CM | POA: Diagnosis not present

## 2023-07-10 DIAGNOSIS — M79675 Pain in left toe(s): Secondary | ICD-10-CM | POA: Diagnosis not present

## 2023-07-10 DIAGNOSIS — G629 Polyneuropathy, unspecified: Secondary | ICD-10-CM | POA: Diagnosis not present

## 2023-07-10 DIAGNOSIS — Z7901 Long term (current) use of anticoagulants: Secondary | ICD-10-CM | POA: Diagnosis not present

## 2023-07-10 DIAGNOSIS — B351 Tinea unguium: Secondary | ICD-10-CM

## 2023-07-10 DIAGNOSIS — L84 Corns and callosities: Secondary | ICD-10-CM

## 2023-07-10 NOTE — Patient Instructions (Addendum)
 Keep a small amount of antibiotic ointment and a bandage on the left big toe. Monitor for any signs of infection such as increased redness, pain, swelling, drainage. If anything were to occur, let me know.   For the creams to help with the nerve pain you can use capsaicin cream, Aspercreme with lidocaine .

## 2023-07-10 NOTE — Progress Notes (Signed)
 Remote ICD transmission.

## 2023-07-10 NOTE — Progress Notes (Signed)
 Subjective: Chief Complaint  Patient presents with   RFC    RM#14 RFC / CALLUSES patient on blood thinners.     89 year old female presents the office with above concerns.  She states her nails are thick and discolored.  She also gets pain to the tip of her left big toe.  She also gets a corn on her other toe and the fifth digit.  No open lesions or any injuries that she reports.   She does have nerve pain to both of her legs which has been more of a chronic issue.  On Eliquis   Objective: AAO x3, NAD DP/PT pulses palpable bilaterally, CRT less than 3 seconds Patient decreased bilateral lower extremities with SWMF. Hyperkeratotic lesion distal aspect left hallux and upon debridement some amount of bleeding occurred but there is no purulence.  There is no surrounding erythema, ascending cellulitis.  No fluctuation or crepitation but was noted.  Hyperkeratotic lesion of the left fifth toe without any underlying ulceration. Nails are hypertrophic, dystrophic, brittle, discolored, elongated 10. No surrounding redness or drainage. Tenderness nails 1-5 bilaterally.  Digital clubbing noted. No pain with calf compression, swelling, warmth, erythema  Assessment: Preulcerative callus due to digital deformity left hallux; symptomatic onychomycosi; neuropathy   Plan: Hyperkeratotic lesions -Sharply debrided the hyperkeratotic lesion with any complications or bleeding to the fifth toe.  Also debrided the lesion of the distal aspect left hallux there is some amount of bleeding occurred.  No underlying infection.  Antibiotic ointment is applied followed by dressing.  Continue with daily dressing changes, offloading.  Monitor for any signs or symptoms of patient.  Offloading pads were dispensed.  Symptomatic onychomycosis -Sharply debrided nails x 10 without any complications or bleeding.    Neuropathy - She is already taking gabapentin .  Discussed changing medications and topical medications.   Discussed topical creams that she can use on a regular basis to help prior to changing oral medications.  Return in about 2 months (around 09/10/2023).   Donnice JONELLE Fees DPM

## 2023-07-11 ENCOUNTER — Other Ambulatory Visit: Payer: Self-pay

## 2023-07-11 ENCOUNTER — Encounter (HOSPITAL_COMMUNITY): Payer: Self-pay

## 2023-07-11 ENCOUNTER — Inpatient Hospital Stay (HOSPITAL_COMMUNITY)
Admission: EM | Admit: 2023-07-11 | Discharge: 2023-07-17 | DRG: 640 | Disposition: A | Discharge status: Hospice | Payer: Medicare (Managed Care) | Attending: Internal Medicine | Admitting: Internal Medicine

## 2023-07-11 ENCOUNTER — Emergency Department (HOSPITAL_COMMUNITY): Payer: Medicare (Managed Care)

## 2023-07-11 DIAGNOSIS — I4891 Unspecified atrial fibrillation: Secondary | ICD-10-CM | POA: Diagnosis not present

## 2023-07-11 DIAGNOSIS — R7401 Elevation of levels of liver transaminase levels: Secondary | ICD-10-CM | POA: Diagnosis not present

## 2023-07-11 DIAGNOSIS — I480 Paroxysmal atrial fibrillation: Secondary | ICD-10-CM | POA: Diagnosis not present

## 2023-07-11 DIAGNOSIS — R9431 Abnormal electrocardiogram [ECG] [EKG]: Secondary | ICD-10-CM | POA: Diagnosis not present

## 2023-07-11 DIAGNOSIS — Z885 Allergy status to narcotic agent status: Secondary | ICD-10-CM

## 2023-07-11 DIAGNOSIS — Z7189 Other specified counseling: Secondary | ICD-10-CM | POA: Diagnosis not present

## 2023-07-11 DIAGNOSIS — I130 Hypertensive heart and chronic kidney disease with heart failure and stage 1 through stage 4 chronic kidney disease, or unspecified chronic kidney disease: Secondary | ICD-10-CM | POA: Diagnosis present

## 2023-07-11 DIAGNOSIS — E039 Hypothyroidism, unspecified: Secondary | ICD-10-CM | POA: Diagnosis present

## 2023-07-11 DIAGNOSIS — E1122 Type 2 diabetes mellitus with diabetic chronic kidney disease: Secondary | ICD-10-CM | POA: Diagnosis present

## 2023-07-11 DIAGNOSIS — K219 Gastro-esophageal reflux disease without esophagitis: Secondary | ICD-10-CM | POA: Diagnosis present

## 2023-07-11 DIAGNOSIS — J302 Other seasonal allergic rhinitis: Secondary | ICD-10-CM | POA: Diagnosis present

## 2023-07-11 DIAGNOSIS — Z79899 Other long term (current) drug therapy: Secondary | ICD-10-CM

## 2023-07-11 DIAGNOSIS — I252 Old myocardial infarction: Secondary | ICD-10-CM

## 2023-07-11 DIAGNOSIS — N390 Urinary tract infection, site not specified: Secondary | ICD-10-CM | POA: Diagnosis present

## 2023-07-11 DIAGNOSIS — Z66 Do not resuscitate: Secondary | ICD-10-CM | POA: Diagnosis present

## 2023-07-11 DIAGNOSIS — R0902 Hypoxemia: Secondary | ICD-10-CM | POA: Diagnosis present

## 2023-07-11 DIAGNOSIS — E1169 Type 2 diabetes mellitus with other specified complication: Secondary | ICD-10-CM | POA: Diagnosis present

## 2023-07-11 DIAGNOSIS — I429 Cardiomyopathy, unspecified: Secondary | ICD-10-CM | POA: Diagnosis present

## 2023-07-11 DIAGNOSIS — E782 Mixed hyperlipidemia: Secondary | ICD-10-CM | POA: Diagnosis present

## 2023-07-11 DIAGNOSIS — E876 Hypokalemia: Secondary | ICD-10-CM | POA: Diagnosis present

## 2023-07-11 DIAGNOSIS — E785 Hyperlipidemia, unspecified: Secondary | ICD-10-CM | POA: Diagnosis not present

## 2023-07-11 DIAGNOSIS — E1151 Type 2 diabetes mellitus with diabetic peripheral angiopathy without gangrene: Secondary | ICD-10-CM | POA: Diagnosis present

## 2023-07-11 DIAGNOSIS — R7989 Other specified abnormal findings of blood chemistry: Secondary | ICD-10-CM | POA: Diagnosis not present

## 2023-07-11 DIAGNOSIS — N1831 Chronic kidney disease, stage 3a: Secondary | ICD-10-CM | POA: Diagnosis present

## 2023-07-11 DIAGNOSIS — Z91040 Latex allergy status: Secondary | ICD-10-CM

## 2023-07-11 DIAGNOSIS — H3530 Unspecified macular degeneration: Secondary | ICD-10-CM | POA: Diagnosis present

## 2023-07-11 DIAGNOSIS — M549 Dorsalgia, unspecified: Secondary | ICD-10-CM | POA: Diagnosis present

## 2023-07-11 DIAGNOSIS — Z79890 Hormone replacement therapy (postmenopausal): Secondary | ICD-10-CM

## 2023-07-11 DIAGNOSIS — Z789 Other specified health status: Secondary | ICD-10-CM | POA: Diagnosis not present

## 2023-07-11 DIAGNOSIS — E1165 Type 2 diabetes mellitus with hyperglycemia: Secondary | ICD-10-CM | POA: Diagnosis present

## 2023-07-11 DIAGNOSIS — Z7982 Long term (current) use of aspirin: Secondary | ICD-10-CM

## 2023-07-11 DIAGNOSIS — I447 Left bundle-branch block, unspecified: Secondary | ICD-10-CM | POA: Diagnosis present

## 2023-07-11 DIAGNOSIS — G8929 Other chronic pain: Secondary | ICD-10-CM | POA: Diagnosis present

## 2023-07-11 DIAGNOSIS — Z8674 Personal history of sudden cardiac arrest: Secondary | ICD-10-CM

## 2023-07-11 DIAGNOSIS — Z9841 Cataract extraction status, right eye: Secondary | ICD-10-CM

## 2023-07-11 DIAGNOSIS — I5033 Acute on chronic diastolic (congestive) heart failure: Secondary | ICD-10-CM | POA: Diagnosis present

## 2023-07-11 DIAGNOSIS — Z91048 Other nonmedicinal substance allergy status: Secondary | ICD-10-CM

## 2023-07-11 DIAGNOSIS — I2510 Atherosclerotic heart disease of native coronary artery without angina pectoris: Secondary | ICD-10-CM | POA: Diagnosis present

## 2023-07-11 DIAGNOSIS — Z86718 Personal history of other venous thrombosis and embolism: Secondary | ICD-10-CM

## 2023-07-11 DIAGNOSIS — I4819 Other persistent atrial fibrillation: Secondary | ICD-10-CM | POA: Diagnosis present

## 2023-07-11 DIAGNOSIS — I5021 Acute systolic (congestive) heart failure: Secondary | ICD-10-CM | POA: Diagnosis not present

## 2023-07-11 DIAGNOSIS — I951 Orthostatic hypotension: Secondary | ICD-10-CM | POA: Diagnosis present

## 2023-07-11 DIAGNOSIS — Z7901 Long term (current) use of anticoagulants: Secondary | ICD-10-CM

## 2023-07-11 DIAGNOSIS — I440 Atrioventricular block, first degree: Secondary | ICD-10-CM | POA: Diagnosis present

## 2023-07-11 DIAGNOSIS — Z515 Encounter for palliative care: Secondary | ICD-10-CM

## 2023-07-11 DIAGNOSIS — E871 Hypo-osmolality and hyponatremia: Principal | ICD-10-CM | POA: Diagnosis present

## 2023-07-11 DIAGNOSIS — Z833 Family history of diabetes mellitus: Secondary | ICD-10-CM

## 2023-07-11 DIAGNOSIS — Z888 Allergy status to other drugs, medicaments and biological substances status: Secondary | ICD-10-CM

## 2023-07-11 DIAGNOSIS — Z95810 Presence of automatic (implantable) cardiac defibrillator: Secondary | ICD-10-CM

## 2023-07-11 DIAGNOSIS — Z83438 Family history of other disorder of lipoprotein metabolism and other lipidemia: Secondary | ICD-10-CM

## 2023-07-11 DIAGNOSIS — Z881 Allergy status to other antibiotic agents status: Secondary | ICD-10-CM

## 2023-07-11 DIAGNOSIS — I509 Heart failure, unspecified: Secondary | ICD-10-CM | POA: Diagnosis not present

## 2023-07-11 DIAGNOSIS — Z8249 Family history of ischemic heart disease and other diseases of the circulatory system: Secondary | ICD-10-CM

## 2023-07-11 DIAGNOSIS — Z555 Less than a high school diploma: Secondary | ICD-10-CM

## 2023-07-11 DIAGNOSIS — Z91041 Radiographic dye allergy status: Secondary | ICD-10-CM

## 2023-07-11 DIAGNOSIS — I5031 Acute diastolic (congestive) heart failure: Secondary | ICD-10-CM | POA: Diagnosis not present

## 2023-07-11 DIAGNOSIS — I7143 Infrarenal abdominal aortic aneurysm, without rupture: Secondary | ICD-10-CM | POA: Diagnosis present

## 2023-07-11 DIAGNOSIS — Z87442 Personal history of urinary calculi: Secondary | ICD-10-CM

## 2023-07-11 DIAGNOSIS — Z955 Presence of coronary angioplasty implant and graft: Secondary | ICD-10-CM

## 2023-07-11 LAB — HEPATIC FUNCTION PANEL
ALT: 80 U/L — ABNORMAL HIGH (ref 0–44)
AST: 103 U/L — ABNORMAL HIGH (ref 15–41)
Albumin: 3.3 g/dL — ABNORMAL LOW (ref 3.5–5.0)
Alkaline Phosphatase: 80 U/L (ref 38–126)
Bilirubin, Direct: 0.1 mg/dL (ref 0.0–0.2)
Indirect Bilirubin: 0.5 mg/dL (ref 0.3–0.9)
Total Bilirubin: 0.6 mg/dL (ref 0.0–1.2)
Total Protein: 6.5 g/dL (ref 6.5–8.1)

## 2023-07-11 LAB — CBC
HCT: 27.3 % — ABNORMAL LOW (ref 36.0–46.0)
Hemoglobin: 8.8 g/dL — ABNORMAL LOW (ref 12.0–15.0)
MCH: 26.3 pg (ref 26.0–34.0)
MCHC: 32.2 g/dL (ref 30.0–36.0)
MCV: 81.7 fL (ref 80.0–100.0)
Platelets: 244 10*3/uL (ref 150–400)
RBC: 3.34 MIL/uL — ABNORMAL LOW (ref 3.87–5.11)
RDW: 16 % — ABNORMAL HIGH (ref 11.5–15.5)
WBC: 6.3 10*3/uL (ref 4.0–10.5)
nRBC: 1.1 % — ABNORMAL HIGH (ref 0.0–0.2)

## 2023-07-11 LAB — BASIC METABOLIC PANEL WITH GFR
Anion gap: 13 (ref 5–15)
BUN: 17 mg/dL (ref 8–23)
CO2: 21 mmol/L — ABNORMAL LOW (ref 22–32)
Calcium: 8.9 mg/dL (ref 8.9–10.3)
Chloride: 85 mmol/L — ABNORMAL LOW (ref 98–111)
Creatinine, Ser: 1.13 mg/dL — ABNORMAL HIGH (ref 0.44–1.00)
GFR, Estimated: 47 mL/min — ABNORMAL LOW (ref >60)
Glucose, Bld: 194 mg/dL — ABNORMAL HIGH (ref 70–99)
Potassium: 4.4 mmol/L (ref 3.5–5.1)
Sodium: 119 mmol/L — CL (ref 135–145)

## 2023-07-11 LAB — TROPONIN I (HIGH SENSITIVITY)
Troponin I (High Sensitivity): 11 ng/L (ref <18)
Troponin I (High Sensitivity): 13 ng/L (ref <18)

## 2023-07-11 LAB — BRAIN NATRIURETIC PEPTIDE: B Natriuretic Peptide: 2362 pg/mL — ABNORMAL HIGH (ref 0.0–100.0)

## 2023-07-11 MED ORDER — CHLORHEXIDINE GLUCONATE CLOTH 2 % EX PADS
6.0000 | MEDICATED_PAD | Freq: Every day | CUTANEOUS | Status: DC
  Administered 2023-07-12 – 2023-07-16 (×6): 6 via TOPICAL

## 2023-07-11 MED ORDER — FUROSEMIDE 10 MG/ML IJ SOLN
40.0000 mg | Freq: Once | INTRAMUSCULAR | Status: AC
  Administered 2023-07-11: 40 mg via INTRAVENOUS
  Filled 2023-07-11: qty 4

## 2023-07-11 NOTE — ED Triage Notes (Signed)
 Rcems from cc of weakness, decreased appetite (emesis x1) then center chest pain when ems backed in.  Ems placed on 2l because oxygen was 88%. 97 here on room air.  Has DNR form at bedside.  Does not have hearing aids in.

## 2023-07-11 NOTE — ED Provider Notes (Incomplete)
 [place] EMERGENCY DEPARTMENT AT [facility] Provider Note   CSN: [id number] Arrival date & time: 07/11/23  2013     Patient presents with: Weakness and Chest Pain   [name] is a 89 y.o. female   {Add pertinent medical, surgical, social history, OB history to [id number]:[id number]} The history is provided by the patient and a relative (Daughter and son-in-law at bedside).  Chest Pain      Prior to Admission medications   Medication Sig Start Date End Date Taking? Authorizing Provider  acetaminophen  (TYLENOL ) 500 MG tablet Take 1 tablet (500 mg total) by mouth every 6 (six) hours as needed for mild pain, fever or headache. Patient taking differently: Take 325 mg by mouth at bedtime. 04/05/20  Yes Johnson, Clanford L, MD  Alpha-Lipoic Acid 600 MG TABS Take 600 mg by mouth daily. For neuropathy in legs Patient taking differently: Take 100 mg by mouth daily. For neuropathy in legs 08/18/20  Yes Gottschalk, Ashly M, DO  amiodarone  (PACERONE ) 200 MG tablet Take 1 tablet (200 mg total) by mouth daily. 10/04/20  Yes Croitoru, Mihai, MD  amLODipine  (NORVASC ) 5 MG tablet Take 5 mg by mouth daily. Hold for bp less than 130/60   Yes [provider]  aspirin  EC 81 MG EC tablet Take 1 tablet (81 mg total) by mouth daily. Swallow whole. 02/09/21  Yes Leotis Bogus, MD  carboxymethylcellulose (REFRESH PLUS) 0.5 % SOLN Place 1 drop into both eyes 3 (three) times daily as needed (dry/irritated eyes.).   Yes [provider]  docusate sodium  (COLACE) 100 MG capsule Take 1 capsule (100 mg total) by mouth daily. Patient taking differently: Take 100-200 mg by mouth at bedtime. 11/30/22  Yes Mahon, Courtney L, NP  ELIQUIS  2.5 MG TABS tablet TAKE (1) TABLET TWICE A DAY. Patient taking differently: Take 2.5 mg by mouth 2 (two) times daily. 02/18/21  Yes Croitoru, Mihai, MD  gabapentin  (NEURONTIN ) 100 MG capsule Take 1 capsule (100 mg total) by mouth at bedtime. Patient taking  differently: Take 300 mg by mouth at bedtime. 09/12/22  Yes Tobie Yetta HERO, MD  isosorbide  mononitrate (IMDUR ) 60 MG 24 hr tablet Take 1 tablet (60 mg total) by mouth daily. 01/31/21  Yes Croitoru, Mihai, MD  latanoprost  (XALATAN ) 0.005 % ophthalmic solution Place 1 drop into the left eye at bedtime. 07/12/21  Yes [provider]  levothyroxine  (SYNTHROID ) 125 MCG tablet Take 1 tablet (125 mcg total) by mouth daily. 06/12/23  Yes Croitoru, Mihai, MD  MELATONIN PO Take 10 mg by mouth at bedtime.   Yes [provider]  metoprolol  succinate (TOPROL -XL) 25 MG 24 hr tablet Take 0.5 tablets (12.5 mg total) by mouth daily. Take with or immediately following a meal. 01/31/21 07/11/23 Yes Croitoru, Mihai, MD  Multiple Vitamin (THEREMS) TABS Take 1 tablet by mouth daily.   Yes [provider]  Multiple Vitamins-Minerals (CENTRUM SILVER ULTRA WOMENS PO) Take 1 tablet by mouth every morning.   Yes [provider]  nitroGLYCERIN  (NITROSTAT ) 0.4 MG SL tablet Place 1 tablet (0.4 mg total) under the tongue every 5 (five) minutes as needed for chest pain. 06/12/23  Yes Croitoru, Mihai, MD  pantoprazole  (PROTONIX ) 40 MG tablet Take 1 tablet (40 mg total) by mouth 2 (two) times daily before a meal. 11/30/22  Yes Mahon, Charmaine CROME, NP  rosuvastatin  (CRESTOR ) 10 MG tablet TAKE 1 TABLET DAILY 05/17/21  Yes Gottschalk, Ashly M, DO  simethicone  (MYLICON) 125 MG chewable tablet Chew  125 mg by mouth every 6 (six) hours as needed for flatulence.   Yes [provider]  venlafaxine  (EFFEXOR ) 37.5 MG tablet Take 37.5 mg by mouth daily. 1500   Yes [provider]    Allergies: Sotalol, Tramadol , Actos [pioglitazone], Adhesive [tape], Atorvastatin , Bactrim  [sulfamethoxazole -trimethoprim ], Ciprofloxacin , Contrast media [iodinated contrast media], Latex, Pedi-pre tape spray [wound dressing adhesive], and Sulfa  antibiotics    Review of Systems  Updated Vital Signs BP (!) 145/72    Pulse (!) 58   Temp (!) 97.4 F (36.3 C)   Resp (!) 22   Ht 5' 3 (1.6 m)   Wt 49 kg   SpO2 96%   BMI 19.14 kg/m   Physical Exam  (all labs ordered are listed, but only abnormal results are displayed) Labs Reviewed  BASIC METABOLIC PANEL WITH GFR - Abnormal; Notable for the following components:      Result Value   Sodium 119 (*)    Chloride 85 (*)    CO2 21 (*)    Glucose, Bld 194 (*)    Creatinine, Ser 1.13 (*)    GFR, Estimated 47 (*)    All other components within normal limits  CBC - Abnormal; Notable for the following components:   RBC 3.34 (*)    Hemoglobin 8.8 (*)    HCT 27.3 (*)    RDW 16.0 (*)    nRBC 1.1 (*)    All other components within normal limits  HEPATIC FUNCTION PANEL - Abnormal; Notable for the following components:   Albumin 3.3 (*)    AST 103 (*)    ALT 80 (*)    All other components within normal limits  BRAIN NATRIURETIC PEPTIDE - Abnormal; Notable for the following components:   B Natriuretic Peptide 2,362.0 (*)    All other components within normal limits  TROPONIN I (HIGH SENSITIVITY)  TROPONIN I (HIGH SENSITIVITY)    EKG: None  Radiology: DG Chest Port 1 View Result Date: 07/11/2023 CLINICAL DATA:  Chest pain EXAM: PORTABLE CHEST 1 VIEW COMPARISON:  Chest x-ray 12/09/2021 FINDINGS: Left-sided ICD present. The heart is enlarged the aorta is tortuous, unchanged. There central pulmonary vascular congestion and central interstitial prominence. There some mild patchy airspace opacities in the lower lungs and a small left pleural effusion. There is no pneumothorax or acute fracture. IMPRESSION: 1. Cardiomegaly with central pulmonary vascular congestion and interstitial edema. 2. Mild patchy airspace opacities in the lower lungs and small left pleural effusion. Electronically Signed   By: Greig Pique M.D.   On: 07/11/2023 21:22    {Document cardiac monitor, telemetry assessment procedure when appropriate:32947} Procedures   Medications  Ordered in the ED  furosemide  (LASIX ) injection 40 mg (has no administration in time range)      {Click here for ABCD2, HEART and other calculators REFRESH Note before signing:1}                              Medical Decision Making Amount and/or Complexity of Data Reviewed Labs: ordered.  Risk Prescription drug management.   ***  {Document critical care time when appropriate  Document review of labs and clinical decision tools ie CHADS2VASC2, etc  Document your independent review of radiology images and any outside records  Document your discussion with family members, caretakers and with consultants  Document social determinants of health affecting pt's care  Document your decision making why or why not admission, treatments were needed:32947:::1}  Final diagnoses:  None    ED Discharge Orders     None

## 2023-07-11 NOTE — ED Provider Notes (Signed)
 [place] EMERGENCY DEPARTMENT AT [facility] Provider Note   CSN: [id number] Arrival date & time: 07/11/23  2013     Patient presents with: Weakness and Chest Pain   [name] is a 89 y.o. female presenting for evaluation of generalized weakness, decreased appetite, shortness of breath, increased swelling in her legs and emesis x 1 prior to arrival.  She has been several days admitted at [facility] ER last week where she was treated for enteritis, stating her diarrhea symptoms have resolved but continues to feel weak since that admission.  She also noted midsternal chest pressure which started just prior to arrival during transport.  She was found to be hypoxic by EMS with an oxygen saturation of 88% on room air.  She denies cough, fevers, she does endorse orthopnea.  She does not have a history of CHF.  Medical history is significant for history of MI, atrial fibrillation on Eliquis , hypertension, GERD, chronic kidney disease and type 2 diabetes.  She also has a cardiac defibrillator in place.   The history is provided by the patient and a relative (Daughter and son-in-law at bedside).  Chest Pain Associated symptoms: fatigue, nausea, shortness of breath, vomiting and weakness   Associated symptoms: no abdominal pain, no dizziness, no fever, no headache and no numbness        Prior to Admission medications   Medication Sig Start Date End Date Taking? Authorizing Provider  acetaminophen  (TYLENOL ) 500 MG tablet Take 1 tablet (500 mg total) by mouth every 6 (six) hours as needed for mild pain, fever or headache. Patient taking differently: Take 325 mg by mouth at bedtime. 04/05/20  Yes Johnson, Clanford L, MD  Alpha-Lipoic Acid 600 MG TABS Take 600 mg by mouth daily. For neuropathy in legs Patient taking differently: Take 100 mg by mouth daily. For neuropathy in legs 08/18/20  Yes Gottschalk, Ashly M, DO  amiodarone  (PACERONE ) 200 MG tablet Take 1 tablet (200 mg total) by mouth  daily. 10/04/20  Yes Croitoru, Mihai, MD  amLODipine  (NORVASC ) 5 MG tablet Take 5 mg by mouth daily. Hold for bp less than 130/60   Yes [provider]  aspirin  EC 81 MG EC tablet Take 1 tablet (81 mg total) by mouth daily. Swallow whole. 02/09/21  Yes Leotis Bogus, MD  carboxymethylcellulose (REFRESH PLUS) 0.5 % SOLN Place 1 drop into both eyes 3 (three) times daily as needed (dry/irritated eyes.).   Yes [provider]  docusate sodium  (COLACE) 100 MG capsule Take 1 capsule (100 mg total) by mouth daily. Patient taking differently: Take 100-200 mg by mouth at bedtime. 11/30/22  Yes Mahon, Courtney L, NP  ELIQUIS  2.5 MG TABS tablet TAKE (1) TABLET TWICE A DAY. Patient taking differently: Take 2.5 mg by mouth 2 (two) times daily. 02/18/21  Yes Croitoru, Mihai, MD  gabapentin  (NEURONTIN ) 100 MG capsule Take 1 capsule (100 mg total) by mouth at bedtime. Patient taking differently: Take 300 mg by mouth at bedtime. 09/12/22  Yes Tobie Yetta HERO, MD  isosorbide  mononitrate (IMDUR ) 60 MG 24 hr tablet Take 1 tablet (60 mg total) by mouth daily. 01/31/21  Yes Croitoru, Mihai, MD  latanoprost  (XALATAN ) 0.005 % ophthalmic solution Place 1 drop into the left eye at bedtime. 07/12/21  Yes [provider]  levothyroxine  (SYNTHROID ) 125 MCG tablet Take 1 tablet (125 mcg total) by mouth daily. 06/12/23  Yes Croitoru, Mihai, MD  MELATONIN PO Take 10 mg by mouth at bedtime.   Yes [provider]  metoprolol  succinate (TOPROL -XL) 25 MG 24 hr tablet Take 0.5 tablets (12.5 mg total) by mouth daily. Take with or immediately following a meal. 01/31/21 07/11/23 Yes Croitoru, Mihai, MD  Multiple Vitamin (THEREMS) TABS Take 1 tablet by mouth daily.   Yes [provider]  Multiple Vitamins-Minerals (CENTRUM SILVER ULTRA WOMENS PO) Take 1 tablet by mouth every morning.   Yes [provider]  nitroGLYCERIN  (NITROSTAT ) 0.4 MG SL tablet Place 1 tablet (0.4 mg total) under the tongue  every 5 (five) minutes as needed for chest pain. 06/12/23  Yes Croitoru, Mihai, MD  pantoprazole  (PROTONIX ) 40 MG tablet Take 1 tablet (40 mg total) by mouth 2 (two) times daily before a meal. 11/30/22  Yes Mahon, Courtney L, NP  rosuvastatin  (CRESTOR ) 10 MG tablet TAKE 1 TABLET DAILY 05/17/21  Yes Gottschalk, Ashly M, DO  simethicone  (MYLICON) 125 MG chewable tablet Chew 125 mg by mouth every 6 (six) hours as needed for flatulence.   Yes [provider]  venlafaxine  (EFFEXOR ) 37.5 MG tablet Take 37.5 mg by mouth daily. 1500   Yes [provider]    Allergies: Sotalol, Tramadol , Actos [pioglitazone], Adhesive [tape], Atorvastatin , Bactrim  [sulfamethoxazole -trimethoprim ], Ciprofloxacin , Contrast media [iodinated contrast media], Latex, Pedi-pre tape spray [wound dressing adhesive], and Sulfa  antibiotics    Review of Systems  Constitutional:  Positive for fatigue. Negative for fever.  HENT:  Negative for congestion and sore throat.   Eyes: Negative.   Respiratory:  Positive for shortness of breath. Negative for chest tightness.   Cardiovascular:  Positive for chest pain and leg swelling.  Gastrointestinal:  Positive for nausea and vomiting. Negative for abdominal pain.  Genitourinary: Negative.   Musculoskeletal:  Negative for arthralgias, joint swelling and neck pain.  Skin: Negative.  Negative for rash and wound.  Neurological:  Positive for weakness. Negative for dizziness, light-headedness, numbness and headaches.  Psychiatric/Behavioral: Negative.      Updated Vital Signs BP (!) 142/68 (BP Location: Left Arm)   Pulse (!) 57   Temp (!) 97.4 F (36.3 C)   Resp 17   Ht 5' 3 (1.6 m)   Wt 49 kg   SpO2 100%   BMI 19.14 kg/m   Physical Exam Vitals and nursing note reviewed.  Constitutional:      Appearance: She is well-developed.  HENT:     Head: Normocephalic and atraumatic.   Eyes:     Conjunctiva/sclera: Conjunctivae normal.    Cardiovascular:     Rate  and Rhythm: Regular rhythm. Bradycardia present.     Heart sounds: Normal heart sounds.  Pulmonary:     Effort: Pulmonary effort is normal.     Breath sounds: Rales present. No wheezing.  Abdominal:     General: Bowel sounds are normal.     Palpations: Abdomen is soft.     Tenderness: There is no abdominal tenderness.   Musculoskeletal:        General: Normal range of motion.     Cervical back: Normal range of motion.     Right lower leg: No tenderness. Edema present.     Left lower leg: No tenderness. Edema present.   Skin:    General: Skin is warm and dry.   Neurological:     Mental Status: She is alert.     Comments: HOH    (all labs ordered are listed, but only abnormal results are displayed) Labs Reviewed  BASIC METABOLIC PANEL WITH GFR - Abnormal; Notable for the following components:  Result Value   Sodium 119 (*)    Chloride 85 (*)    CO2 21 (*)    Glucose, Bld 194 (*)    Creatinine, Ser 1.13 (*)    GFR, Estimated 47 (*)    All other components within normal limits  CBC - Abnormal; Notable for the following components:   RBC 3.34 (*)    Hemoglobin 8.8 (*)    HCT 27.3 (*)    RDW 16.0 (*)    nRBC 1.1 (*)    All other components within normal limits  HEPATIC FUNCTION PANEL - Abnormal; Notable for the following components:   Albumin 3.3 (*)    AST 103 (*)    ALT 80 (*)    All other components within normal limits  BRAIN NATRIURETIC PEPTIDE - Abnormal; Notable for the following components:   B Natriuretic Peptide 2,362.0 (*)    All other components within normal limits  MRSA NEXT GEN BY PCR, NASAL  TROPONIN I (HIGH SENSITIVITY)  TROPONIN I (HIGH SENSITIVITY)    EKG: EKG Interpretation Date/Time:  Wednesday July 11 2023 20:27:47 EDT Ventricular Rate:  59 PR Interval:    QRS Duration:  209 QT Interval:  527 QTC Calculation: 523 R Axis:   -39  Text Interpretation: Junctional rhythm Left bundle branch block When compared with ECG of 04/06/2023, No  significant change was found Confirmed by Raford Lenis ([id number]) on 07/11/2023 11:59:43 PM  Radiology: [facility] Chest Port 1 View Result Date: 07/11/2023 CLINICAL DATA:  Chest pain EXAM: PORTABLE CHEST 1 VIEW COMPARISON:  Chest x-ray 12/09/2021 FINDINGS: Left-sided ICD present. The heart is enlarged the aorta is tortuous, unchanged. There central pulmonary vascular congestion and central interstitial prominence. There some mild patchy airspace opacities in the lower lungs and a small left pleural effusion. There is no pneumothorax or acute fracture. IMPRESSION: 1. Cardiomegaly with central pulmonary vascular congestion and interstitial edema. 2. Mild patchy airspace opacities in the lower lungs and small left pleural effusion. Electronically Signed   By: Greig Pique M.D.   On: 07/11/2023 21:22     Procedures   Medications Ordered in the ED  Chlorhexidine  Gluconate Cloth 2 % PADS 6 each (has no administration in time range)  furosemide  (LASIX ) injection 40 mg (40 mg Intravenous Given 07/11/23 2248)                                    Medical Decision Making Patient presenting with generalized weakness along with nausea and vomiting, shortness of breath with chest pressure which started just prior to arrival.  Weakness has been gradually worsening since her admission last week for enteritis at an outside hospital.  Differential including dehydration, electrolyte disturbance, persistent enteritis or other intra-abdominal infection, clinically she is fluid overloaded, suspect this may be a new onset CHF presentation, with the chest pressure, this could also involve unstable angina.  Labs and imaging were obtained per below, she has a significant hyponatremia along with a very elevated BNP which would correspond with her exam and chest x-ray confirming CHF.  Patient will need admission, she was given Lasix  40 mg to start her diuresis.  Amount and/or Complexity of Data Reviewed Labs: ordered.    Details: Lab  normalities including a BNP level of 2362, her initial troponin is negative, hepatic function panel shows modest elevation in her LFTs with an AST of 103, ALT of 80, her BNP significant for  sodium of 119 her kidney function looks okay with a creatinine of 1.13, glucose is 194 CBC significant for hemoglobin of 8.8, 3 months ago her hemoglobin was 9.9. Radiology: ordered.    Details: Agree with chest x-ray interpretation, central pulmonary vascular congestion with interstitial edema. ECG/medicine tests: ordered.    Details: EKG is stable with no significant changes, rate is 59, left bundle branch block. Discussion of management or test interpretation with external provider(s): Patient discussed with Dr. Adefeso who accepts patient for admission  Risk Decision regarding hospitalization.        Final diagnoses:  Hyponatremia  Acute congestive heart failure, unspecified heart failure type [facility])    ED Discharge Orders     None          Birdena Mliss RIGGERS 07/12/23 0015    Francesca Elsie CROME, MD 07/12/23 1254

## 2023-07-11 NOTE — H&P (Signed)
 History and Physical    Patient: [name] FMW:[id number] DOB: Dec 31, 1931 DOA: 07/11/2023 DOS: the patient was seen and examined on 07/12/2023 PCP: Cleotilde Laymon HERO, NP  Patient coming from: Home  Chief Complaint:  Chief Complaint  Patient presents with   Weakness   Chest Pain   HPI: [name] is a 89 y.o. female with medical history significant of chronic atrial fibrillation, CAD s/p stenting, CKD stage IIIA, frequent V. tach SP AICD placement who presents to the emergency department due to generalized weakness, shortness of breath, increased swelling in the legs and vomiting x 1 PTA.  She was recently admitted to [facility] from 6/14 to 6/18 due to hyponatremia and enterocolitis, diarrhea has since resolved, but she continued to complain of weakness.  She also complained of midsternal chest pressure PTA.  EMS was activated on arrival of EMS team, she was noted to have an O2 sat of 80% on room air.  Supplemental oxygen via [place] at 2 LPM was provided with improved oxygenation to 97%.  ED Course:  In the emergency department, temperature was 90 7.15F, RR 28/min, HR 59 bpm, BP 142/83, O2 sat 96% on room air.  Workup in the ED showed normocytic anemia, hyponatremia with sodium of 119, potassium 4.4, chloride 85, bicarb 21, blood glucose 194, BUN 17, creatinine 0.13.  AST 103, ALT 80.  Magnesium 1.7 Chest x-ray showed cardiomegaly with central pulmonary vascular congestion and interstitial edema.  Mild patchy airspace opacities in the lower lungs and small left pleural effusion. She was treated with IV Lasix  40 mg x 1.  TRH was asked to admit patient   Review of Systems: Review of systems as noted in the HPI. All other systems reviewed and are negative.   Past Medical History:  Diagnosis Date   AICD (automatic cardioverter/defibrillator) present    Allergy    SESONAL   Aneurysm of infrarenal abdominal aorta (HCC)    Anxiety    ARTHRITIS    Arthritis    Atrial fibrillation (HCC)     CAD (coronary artery disease)    a. history of cardiac arrest 1993;  b. s/p LAD/LCX stenting in 2003;  c. 07/2011 Cath: LM nl, LAD patent stent, D1 80ost, LCX patent stent, RCA min irregs;  d. 04/2012 MV: EF 66%, no ishcemia;  e. 12/2013 Echo: Ef 55%, no rwma, Gr 1 DD, triv AI/MR, mildly dil LA;  f. 12/2013 Lexi MV: intermediate risk - apical ischemia and inf/infsept fixed defect, ? artifactual.   CAD in native artery 12/31/2013   Previous stents to LAD and Ramus patent on cath today 12/31/13 also There is severe disease in the ostial first diagonal which is unchanged from most recent cardiac catheterization. The right coronary artery could not be engaged selectively but nonselective angiography showed no significant disease in the proximal and midsegment.       Cataract    DENIES   Chronic back pain    Chronic sinus bradycardia    CKD (chronic kidney disease), stage III (HCC)    Clotting disorder (HCC)    DVT   COLITIS 12/02/2007   Qualifier: Diagnosis of  By: Kowalk CMA LEODIS), Chick     Diabetes mellitus without complication (HCC)    DENIES   Diverticulosis of colon (without mention of hemorrhage) 2007   Colonoscopy    Esophageal stricture    a. 2012 s/p dil.   Esophagitis, unspecified    a. 2012 EGD   EXTERNAL HEMORRHOIDS  GERD (gastroesophageal reflux disease)    omeprazole   H/O hiatal hernia    Hiatal hernia    a. 2012 EGD.   History of DVT in the past, not on Coumadin now    left  leg   HYPERCHOLESTEROLEMIA    Hypertension    ICD (implantable cardiac defibrillator) in place    a. s/p initial ICD in 1993 in setting of cardiac arrest;  b. 01/2007 gen change: Guidant T135 Vitality DS VR single lead ICD.   Macular degeneration    gets injecton in eye every 5 weeks- last injection - 05/03/2013    Myocardial infarction [facility]) 1993   Near syncope 05/22/2015   Nephrolithiasis, just saw Dr Watt- [place]    Orthostatic hypotension    Peripheral vascular disease (HCC)    ???    RECTAL BLEEDING 12/03/2007   Qualifier: Diagnosis of  By: Jakie MD NOLIA Lenis R    Unspecified gastritis and gastroduodenitis without mention of hemorrhage    a. 2003 EGD->not noted on 2012 EGD.   Unstable angina, neg MI, cath stable.maybe GI 12/30/2013   Past Surgical History:  Procedure Laterality Date   BREAST BIOPSY Left 11/2013   CARDIAC CATHETERIZATION     CARDIAC CATHETERIZATION  01/01/15   patent stents -there is severe disease in ostial 1st diag but without change.    CARDIAC DEFIBRILLATOR PLACEMENT  02/05/2007   Guidant   CARDIOVASCULAR STRESS TEST  12/30/2013   abnormal   CARDIOVERSION N/A 02/13/2019   Procedure: CARDIOVERSION;  Surgeon: Francyne Headland, MD;  Location: [facility] ENDOSCOPY;  Service: Cardiovascular;  Laterality: N/A;   CATARACT EXTRACTION Right    Dr. Octavia   COLONOSCOPY     COLONOSCOPY WITH PROPOFOL  N/A 03/30/2020   Procedure: COLONOSCOPY WITH PROPOFOL ;  Surgeon: Eartha Angelia Sieving, MD;  Location: [facility] ENDO SUITE;  Service: Gastroenterology;  Laterality: N/A;   coronary stents      CYSTOSCOPY WITH URETEROSCOPY Right 05/20/2013   Procedure: CYSTOSCOPY, RIGHT URETEROSCOPY STONE EXTRACTION, Insertion of right DOUBLE J STENT ;  Surgeon: Norleen Watt, MD;  Location: [facility] ORS;  Service: Urology;  Laterality: Right;   CYSTOSCOPY WITH URETEROSCOPY AND STENT PLACEMENT N/A 06/03/2013   Procedure: SECOND LOOK CYSTOSCOPY WITH URETEROSCOPY  HOLMIUM LASER LITHO AND STONE EXTRACTION [name] ;  Surgeon: Norleen JINNY Watt, MD;  Location: [facility] ORS;  Service: Urology;  Laterality: N/A;   ESOPHAGOGASTRODUODENOSCOPY (EGD) WITH PROPOFOL  N/A 11/20/2019   Procedure: ESOPHAGOGASTRODUODENOSCOPY (EGD) WITH PROPOFOL ;  Surgeon: Shaaron Lamar HERO, MD;  Location: [facility];  Service: Endoscopy;  Laterality: N/A;   GIVENS CAPSULE STUDY N/A 10/30/2016   Procedure: GIVENS CAPSULE STUDY;  Surgeon: Abran Norleen SAILOR, MD;  Location: [facility] ENDOSCOPY;  Service: Endoscopy;  Laterality: N/A;  NEEDS TO BE ADMITTED FOR  OBSERVATION PT. HAS DEFIB   HEMORROIDECTOMY  80's   HOLMIUM LASER APPLICATION Right 05/20/2013   Procedure: HOLMIUM LASER APPLICATION;  Surgeon: Norleen Watt, MD;  Location: [facility] ORS;  Service: Urology;  Laterality: Right;   HYSTEROSCOPY WITH D & C  09/02/2010   Procedure: DILATATION AND CURETTAGE (D&C) /HYSTEROSCOPY;  Surgeon: Charlie HERO Croak;  Location: [facility] ORS;  Service: Gynecology;  Laterality: N/A;  Dilation and Curettage with Hysteroscopy and Polypectomy   IMPLANTABLE CARDIOVERTER DEFIBRILLATOR (ICD) GENERATOR CHANGE N/A 02/10/2014   Procedure: ICD GENERATOR CHANGE;  Surgeon: Headland Francyne, MD;  Location: [facility] CATH LAB;  Service: Cardiovascular;  Laterality: N/A;   INTRAMEDULLARY (IM) NAIL INTERTROCHANTERIC Left 09/09/2022   Procedure: INTRAMEDULLARY (IM) NAIL INTERTROCHANTERIC;  Surgeon: Ernie Cough, MD;  Location: [facility] ORS;  Service: Orthopedics;  Laterality: Left;   LEFT HEART CATHETERIZATION WITH CORONARY ANGIOGRAM N/A 07/28/2011   Procedure: LEFT HEART CATHETERIZATION WITH CORONARY ANGIOGRAM;  Surgeon: Dorn JINNY Lesches, MD;  Location: [facility] CATH LAB;  Service: Cardiovascular;  Laterality: N/A;   LEFT HEART CATHETERIZATION WITH CORONARY ANGIOGRAM N/A 12/31/2013   Procedure: LEFT HEART CATHETERIZATION WITH CORONARY ANGIOGRAM;  Surgeon: Deatrice DELENA Cage, MD;  Location: [facility] CATH LAB;  Service: Cardiovascular;  Laterality: N/A;    Social History:  reports that she has never smoked. She has never used smokeless tobacco. She reports that she does not drink alcohol  and does not use drugs.   Allergies  Allergen Reactions   Sotalol Other (See Comments)    Torsades    Tramadol  Other (See Comments)    Hallucination, bad-dream   Actos [Pioglitazone] Swelling   Adhesive [Tape] Other (See Comments)    Causes sores   Atorvastatin  Other (See Comments)    myalgia   Bactrim  [Sulfamethoxazole -Trimethoprim ] Itching and Rash    Itchy rash   Ciprofloxacin  Nausea Only   Contrast Media [Iodinated Contrast  Media] Other (See Comments)    Headache (no action/pre-med required)   Latex Rash   Pedi-Pre Tape Spray [Wound Dressing Adhesive] Other (See Comments)    Causes sores   Sulfa  Antibiotics Other (See Comments)    Causes sores    Family History  Problem Relation Age of Onset   Stroke Mother    Other Mother        brain tumor   Other Father        MI   Heart attack Father    Stroke Sister    Macular degeneration Sister    Diabetes Daughter    Cancer Daughter        ovarian   Colon polyps Daughter    Atrial fibrillation Sister    Hyperlipidemia Sister    Osteoporosis Sister    Stroke Sister    Uterine cancer Sister    Heart attack Brother    Heart disease Brother    Asthma Brother    Breast cancer Neg Hx      Prior to Admission medications   Medication Sig Start Date End Date Taking? Authorizing Provider  acetaminophen  (TYLENOL ) 500 MG tablet Take 1 tablet (500 mg total) by mouth every 6 (six) hours as needed for mild pain, fever or headache. Patient taking differently: Take 325 mg by mouth at bedtime. 04/05/20  Yes Johnson, Clanford L, MD  Alpha-Lipoic Acid 600 MG TABS Take 600 mg by mouth daily. For neuropathy in legs Patient taking differently: Take 100 mg by mouth daily. For neuropathy in legs 08/18/20  Yes Gottschalk, Ashly M, DO  amiodarone  (PACERONE ) 200 MG tablet Take 1 tablet (200 mg total) by mouth daily. 10/04/20  Yes Croitoru, Mihai, MD  amLODipine  (NORVASC ) 5 MG tablet Take 5 mg by mouth daily. Hold for bp less than 130/60   Yes [provider]  aspirin  EC 81 MG EC tablet Take 1 tablet (81 mg total) by mouth daily. Swallow whole. 02/09/21  Yes Leotis Bogus, MD  carboxymethylcellulose (REFRESH PLUS) 0.5 % SOLN Place 1 drop into both eyes 3 (three) times daily as needed (dry/irritated eyes.).   Yes [provider]  docusate sodium  (COLACE) 100 MG capsule Take 1 capsule (100 mg total) by mouth daily. Patient taking differently: Take 100-200 mg by  mouth at bedtime. 11/30/22  Yes Kennedy Charmaine CROME, NP  ELIQUIS  2.5 MG TABS tablet TAKE (1) TABLET TWICE A DAY. Patient taking differently: Take 2.5 mg by mouth 2 (two) times daily. 02/18/21  Yes Croitoru, Mihai, MD  gabapentin  (NEURONTIN ) 100 MG capsule Take 1 capsule (100 mg total) by mouth at bedtime. Patient taking differently: Take 300 mg by mouth at bedtime. 09/12/22  Yes Tobie Yetta HERO, MD  isosorbide  mononitrate (IMDUR ) 60 MG 24 hr tablet Take 1 tablet (60 mg total) by mouth daily. 01/31/21  Yes Croitoru, Mihai, MD  latanoprost  (XALATAN ) 0.005 % ophthalmic solution Place 1 drop into the left eye at bedtime. 07/12/21  Yes [provider]  levothyroxine  (SYNTHROID ) 125 MCG tablet Take 1 tablet (125 mcg total) by mouth daily. 06/12/23  Yes Croitoru, Mihai, MD  MELATONIN PO Take 10 mg by mouth at bedtime.   Yes [provider]  metoprolol  succinate (TOPROL -XL) 25 MG 24 hr tablet Take 0.5 tablets (12.5 mg total) by mouth daily. Take with or immediately following a meal. 01/31/21 07/11/23 Yes Croitoru, Mihai, MD  Multiple Vitamin (THEREMS) TABS Take 1 tablet by mouth daily.   Yes [provider]  Multiple Vitamins-Minerals (CENTRUM SILVER ULTRA WOMENS PO) Take 1 tablet by mouth every morning.   Yes [provider]  nitroGLYCERIN  (NITROSTAT ) 0.4 MG SL tablet Place 1 tablet (0.4 mg total) under the tongue every 5 (five) minutes as needed for chest pain. 06/12/23  Yes Croitoru, Mihai, MD  pantoprazole  (PROTONIX ) 40 MG tablet Take 1 tablet (40 mg total) by mouth 2 (two) times daily before a meal. 11/30/22  Yes Mahon, Charmaine CROME, NP  rosuvastatin  (CRESTOR ) 10 MG tablet TAKE 1 TABLET DAILY 05/17/21  Yes Gottschalk, Ashly M, DO  simethicone  (MYLICON) 125 MG chewable tablet Chew 125 mg by mouth every 6 (six) hours as needed for flatulence.   Yes [provider]  venlafaxine  (EFFEXOR ) 37.5 MG tablet Take 37.5 mg by mouth daily. 1500   Yes [provider]     Physical Exam: BP (!) 131/55   Pulse (!) 56   Temp (!) 97.5 F (36.4 C) (Axillary)   Resp 16   Ht 5' 3 (1.6 m)   Wt 63.6 kg   SpO2 100%   BMI 24.84 kg/m   General: 89 y.o. year-old female well developed well nourished in no acute distress.  Alert and oriented x3. HEENT: NCAT, EOMI Neck: Supple, trachea medial Cardiovascular: Bradycardia.  Regular rate and rhythm with no rubs or gallops.  No thyromegaly or JVD noted.  +2 lower extremity edema bilaterally. 2/4 pulses in all 4 extremities. Respiratory: Bilateral Rales in lower lobes (L  > R ).   Abdomen: Soft, nontender nondistended with normal bowel sounds x4 quadrants. Muskuloskeletal: No cyanosis, clubbing or edema noted bilaterally Neuro: CN II-XII intact, strength 5/5 x 4, sensation, reflexes intact Skin: No ulcerative lesions noted or rashes Psychiatry: Mood is appropriate for condition and setting          Labs on Admission:  Basic Metabolic Panel: Recent Labs  Lab 07/11/23 2046 07/12/23 0551  NA 119* 120*  K 4.4 3.8  CL 85* 89*  CO2 21* 22  GLUCOSE 194* 146*  BUN 17 16  CREATININE 1.13* 1.11*  CALCIUM  8.9 8.6*  MG  --  1.7  PHOS  --  3.7   Liver Function Tests: Recent Labs  Lab 07/11/23 2046 07/12/23 0551  AST 103* 112*  ALT 80* 79*  ALKPHOS 80 74  BILITOT 0.6 0.6  PROT 6.5 6.1*  ALBUMIN  3.3* 3.1*   No results for input(s): LIPASE, AMYLASE in the last 168 hours. No results for input(s): AMMONIA in the last 168 hours. CBC: Recent Labs  Lab 07/11/23 2046 07/12/23 0551  WBC 6.3 6.6  HGB 8.8* 8.5*  HCT 27.3* 25.0*  MCV 81.7 81.2  PLT 244 246   Cardiac Enzymes: No results for input(s): CKTOTAL, CKMB, CKMBINDEX, TROPONINI in the last 168 hours.  BNP (last 3 results) Recent Labs    07/11/23 2046  BNP 2,362.0*    ProBNP (last 3 results) No results for input(s): PROBNP in the last 8760 hours.  CBG: Recent Labs  Lab 07/12/23 0113  GLUCAP 203*    Radiological  Exams on Admission: DG Chest Port 1 View Result Date: 07/11/2023 CLINICAL DATA:  Chest pain EXAM: PORTABLE CHEST 1 VIEW COMPARISON:  Chest x-ray 12/09/2021 FINDINGS: Left-sided ICD present. The heart is enlarged the aorta is tortuous, unchanged. There central pulmonary vascular congestion and central interstitial prominence. There some mild patchy airspace opacities in the lower lungs and a small left pleural effusion. There is no pneumothorax or acute fracture. IMPRESSION: 1. Cardiomegaly with central pulmonary vascular congestion and interstitial edema. 2. Mild patchy airspace opacities in the lower lungs and small left pleural effusion. Electronically Signed   By: Greig Pique M.D.   On: 07/11/2023 21:22    EKG: I independently viewed the EKG done and my findings are as followed: Junctional rhythm at a rate of 59 bpm with LBBB and QTc of 523 ms  Assessment/Plan Present on Admission:  Hyponatremia  Prolonged QT interval  Persistent atrial fibrillation (HCC)  CAD (coronary artery disease)  Hyperlipidemia associated with type 2 diabetes mellitus (HCC)  Acquired hypothyroidism  Principal Problem:   Hyponatremia Active Problems:   Hyperlipidemia associated with type 2 diabetes mellitus (HCC)   Persistent atrial fibrillation (HCC)   CAD (coronary artery disease)   Acquired hypothyroidism   Prolonged QT interval   Elevated brain natriuretic peptide (BNP) level   Transaminitis   Type 2 diabetes mellitus with hyperglycemia (HCC)  Hyponatremia Na 119 > 120 This may be dilutional considering slight increase in sodium after Lasix  Urine osmolality, serum osmolality and urine sodium will be checked  Elevated BNP rule out new onset CHF BNP 2,362 Continue total input/output, daily weights and fluid restriction Continue IV Lasix  40 mg twice daily Continue heart healthy/carb modified diet  Echocardiogram in the morning   Prolonged QT interval QTc 523 ms Avoid QT prolonging  drugs Magnesium level will be checked Repeat EKG in the afternoon  Transaminitis AST 103, ALT 80 This may be due to congestive hepatopathy Continue IV Lasix  40 mg twice daily  Type 2 diabetes mellitus with hyperglycemia Hemoglobin A1c on 05/24/2023 was 6.5 Continue ISS and hypoglycemia protocol  Persistent atrial fibrillation Continue Toprol , Eliquis  Amiodarone  is temporarily held due to prolonged QT interval  CAD Continue aspirin , Toprol  XL per home regimen  Acquired hypothyroidism Continue Synthroid   Mixed hyperlipidemia Continue Crestor   DVT prophylaxis: Eliquis   Code Status: DNR  Family Communication: None at bedside  Consults: None  Severity of Illness: The appropriate patient status for this patient is INPATIENT. Inpatient status is judged to be reasonable and necessary in order to provide the required intensity of service to ensure the patient's safety. The patient's presenting symptoms, physical exam findings, and initial radiographic and laboratory data in the context of their chronic comorbidities is felt to place them at high risk for further clinical deterioration. Furthermore, it is not anticipated  that the patient will be medically stable for discharge from the hospital within 2 midnights of admission.   * I certify that at the point of admission it is my clinical judgment that the patient will require inpatient hospital care spanning beyond 2 midnights from the point of admission due to high intensity of service, high risk for further deterioration and high frequency of surveillance required.*  Author: Aberdeen Hafen, DO 07/12/2023 7:45 AM  For on call review [email or web address].

## 2023-07-12 ENCOUNTER — Inpatient Hospital Stay (HOSPITAL_COMMUNITY): Payer: Medicare (Managed Care)

## 2023-07-12 ENCOUNTER — Encounter (HOSPITAL_COMMUNITY): Payer: Self-pay | Admitting: Internal Medicine

## 2023-07-12 DIAGNOSIS — R7989 Other specified abnormal findings of blood chemistry: Secondary | ICD-10-CM | POA: Insufficient documentation

## 2023-07-12 DIAGNOSIS — I5031 Acute diastolic (congestive) heart failure: Secondary | ICD-10-CM | POA: Diagnosis not present

## 2023-07-12 DIAGNOSIS — R7401 Elevation of levels of liver transaminase levels: Secondary | ICD-10-CM | POA: Insufficient documentation

## 2023-07-12 DIAGNOSIS — E1165 Type 2 diabetes mellitus with hyperglycemia: Secondary | ICD-10-CM | POA: Insufficient documentation

## 2023-07-12 DIAGNOSIS — E871 Hypo-osmolality and hyponatremia: Secondary | ICD-10-CM | POA: Diagnosis not present

## 2023-07-12 LAB — GLUCOSE, CAPILLARY
Glucose-Capillary: 203 mg/dL — ABNORMAL HIGH (ref 70–99)
Glucose-Capillary: 162 mg/dL — ABNORMAL HIGH (ref 70–99)
Glucose-Capillary: 153 mg/dL — ABNORMAL HIGH (ref 70–99)
Glucose-Capillary: 123 mg/dL — ABNORMAL HIGH (ref 70–99)

## 2023-07-12 LAB — URINALYSIS, W/ REFLEX TO CULTURE (INFECTION SUSPECTED)
Bilirubin Urine: NEGATIVE
Glucose, UA: NEGATIVE mg/dL
Ketones, ur: NEGATIVE mg/dL
Nitrite: NEGATIVE
Protein, ur: 100 mg/dL — AB
Specific Gravity, Urine: 1.01 (ref 1.005–1.030)
pH: 6 (ref 5.0–8.0)

## 2023-07-12 LAB — ECHOCARDIOGRAM COMPLETE
Area-P 1/2: 4.12 cm2
Calc EF: 41.5 %
Est EF: 35
Height: 63 in
S' Lateral: 3.7 cm
Single Plane A2C EF: 41.6 %
Single Plane A4C EF: 45.4 %
Weight: 2243.4 [oz_av]

## 2023-07-12 LAB — COMPREHENSIVE METABOLIC PANEL WITH GFR
ALT: 79 U/L — ABNORMAL HIGH (ref 0–44)
AST: 112 U/L — ABNORMAL HIGH (ref 15–41)
Albumin: 3.1 g/dL — ABNORMAL LOW (ref 3.5–5.0)
Alkaline Phosphatase: 74 U/L (ref 38–126)
Anion gap: 9 (ref 5–15)
BUN: 16 mg/dL (ref 8–23)
CO2: 22 mmol/L (ref 22–32)
Calcium: 8.6 mg/dL — ABNORMAL LOW (ref 8.9–10.3)
Chloride: 89 mmol/L — ABNORMAL LOW (ref 98–111)
Creatinine, Ser: 1.11 mg/dL — ABNORMAL HIGH (ref 0.44–1.00)
GFR, Estimated: 48 mL/min — ABNORMAL LOW (ref >60)
Glucose, Bld: 146 mg/dL — ABNORMAL HIGH (ref 70–99)
Potassium: 3.8 mmol/L (ref 3.5–5.1)
Sodium: 120 mmol/L — ABNORMAL LOW (ref 135–145)
Total Bilirubin: 0.6 mg/dL (ref 0.0–1.2)
Total Protein: 6.1 g/dL — ABNORMAL LOW (ref 6.5–8.1)

## 2023-07-12 LAB — SODIUM
Sodium: 119 mmol/L — CL (ref 135–145)
Sodium: 121 mmol/L — ABNORMAL LOW (ref 135–145)
Sodium: 122 mmol/L — ABNORMAL LOW (ref 135–145)
Sodium: 122 mmol/L — ABNORMAL LOW (ref 135–145)

## 2023-07-12 LAB — CBC
HCT: 25 % — ABNORMAL LOW (ref 36.0–46.0)
Hemoglobin: 8.5 g/dL — ABNORMAL LOW (ref 12.0–15.0)
MCH: 27.6 pg (ref 26.0–34.0)
MCHC: 34 g/dL (ref 30.0–36.0)
MCV: 81.2 fL (ref 80.0–100.0)
Platelets: 246 10*3/uL (ref 150–400)
RBC: 3.08 MIL/uL — ABNORMAL LOW (ref 3.87–5.11)
RDW: 16 % — ABNORMAL HIGH (ref 11.5–15.5)
WBC: 6.6 10*3/uL (ref 4.0–10.5)
nRBC: 0.6 % — ABNORMAL HIGH (ref 0.0–0.2)

## 2023-07-12 LAB — MRSA NEXT GEN BY PCR, NASAL: MRSA by PCR Next Gen: NOT DETECTED

## 2023-07-12 LAB — OSMOLALITY, URINE: Osmolality, Ur: 278 mosm/kg — ABNORMAL LOW (ref 300–900)

## 2023-07-12 LAB — TSH: TSH: 36.741 u[IU]/mL — ABNORMAL HIGH (ref 0.350–4.500)

## 2023-07-12 LAB — MAGNESIUM: Magnesium: 1.7 mg/dL (ref 1.7–2.4)

## 2023-07-12 LAB — URINE CULTURE

## 2023-07-12 LAB — OSMOLALITY: Osmolality: 260 mosm/kg — ABNORMAL LOW (ref 275–295)

## 2023-07-12 LAB — PHOSPHORUS: Phosphorus: 3.7 mg/dL (ref 2.5–4.6)

## 2023-07-12 LAB — SODIUM, URINE, RANDOM: Sodium, Ur: 29 mmol/L

## 2023-07-12 MED ORDER — PANTOPRAZOLE SODIUM 40 MG PO TBEC
40.0000 mg | DELAYED_RELEASE_TABLET | Freq: Two times a day (BID) | ORAL | Status: DC
  Administered 2023-07-12 – 2023-07-17 (×12): 40 mg via ORAL
  Filled 2023-07-12 (×12): qty 1

## 2023-07-12 MED ORDER — INSULIN ASPART 100 UNIT/ML IJ SOLN
0.0000 [IU] | Freq: Three times a day (TID) | INTRAMUSCULAR | Status: DC
  Administered 2023-07-12 (×2): 2 [IU] via SUBCUTANEOUS
  Administered 2023-07-13: 1 [IU] via SUBCUTANEOUS
  Administered 2023-07-13 – 2023-07-15 (×3): 2 [IU] via SUBCUTANEOUS
  Administered 2023-07-15 – 2023-07-16 (×3): 1 [IU] via SUBCUTANEOUS

## 2023-07-12 MED ORDER — SODIUM CHLORIDE 3 % IV SOLN
INTRAVENOUS | Status: DC
  Filled 2023-07-12 (×3): qty 500

## 2023-07-12 MED ORDER — PROCHLORPERAZINE EDISYLATE 10 MG/2ML IJ SOLN
10.0000 mg | Freq: Four times a day (QID) | INTRAMUSCULAR | Status: DC | PRN
  Administered 2023-07-12 (×2): 10 mg via INTRAVENOUS
  Filled 2023-07-12 (×3): qty 2

## 2023-07-12 MED ORDER — SODIUM CHLORIDE 1 G PO TABS
1.0000 g | ORAL_TABLET | Freq: Three times a day (TID) | ORAL | Status: DC
  Administered 2023-07-12 – 2023-07-16 (×13): 1 g via ORAL
  Filled 2023-07-12 (×13): qty 1

## 2023-07-12 MED ORDER — FUROSEMIDE 10 MG/ML IJ SOLN
40.0000 mg | Freq: Two times a day (BID) | INTRAMUSCULAR | Status: DC
  Administered 2023-07-12: 40 mg via INTRAVENOUS
  Filled 2023-07-12: qty 4

## 2023-07-12 MED ORDER — ACETAMINOPHEN 650 MG RE SUPP: 650.0000 mg | Freq: Four times a day (QID) | RECTAL | Status: DC | PRN

## 2023-07-12 MED ORDER — ENSURE PLUS HIGH PROTEIN PO LIQD
237.0000 mL | Freq: Two times a day (BID) | ORAL | Status: DC
  Administered 2023-07-12 – 2023-07-17 (×11): 237 mL via ORAL

## 2023-07-12 MED ORDER — ROSUVASTATIN CALCIUM 10 MG PO TABS
10.0000 mg | ORAL_TABLET | Freq: Every day | ORAL | Status: DC
  Administered 2023-07-12 – 2023-07-15 (×4): 10 mg via ORAL
  Filled 2023-07-12 (×4): qty 1

## 2023-07-12 MED ORDER — LEVOTHYROXINE SODIUM 25 MCG PO TABS
125.0000 ug | ORAL_TABLET | Freq: Every day | ORAL | Status: DC
  Administered 2023-07-12 – 2023-07-14 (×3): 125 ug via ORAL
  Filled 2023-07-12 (×3): qty 1

## 2023-07-12 MED ORDER — ENOXAPARIN SODIUM 40 MG/0.4ML IJ SOSY: 40.0000 mg | PREFILLED_SYRINGE | INTRAMUSCULAR | Status: DC

## 2023-07-12 MED ORDER — ASPIRIN 81 MG PO TBEC
81.0000 mg | DELAYED_RELEASE_TABLET | Freq: Every day | ORAL | Status: DC
  Administered 2023-07-12 – 2023-07-16 (×5): 81 mg via ORAL
  Filled 2023-07-12 (×5): qty 1

## 2023-07-12 MED ORDER — SODIUM CHLORIDE 0.9 % IV SOLN
2.0000 g | INTRAVENOUS | Status: DC
  Administered 2023-07-12 – 2023-07-14 (×3): 2 g via INTRAVENOUS
  Filled 2023-07-12 (×3): qty 20

## 2023-07-12 MED ORDER — MAGNESIUM SULFATE 2 GM/50ML IV SOLN
2.0000 g | Freq: Once | INTRAVENOUS | Status: AC
  Administered 2023-07-12: 2 g via INTRAVENOUS
  Filled 2023-07-12: qty 50

## 2023-07-12 MED ORDER — AMLODIPINE BESYLATE 5 MG PO TABS
5.0000 mg | ORAL_TABLET | Freq: Every day | ORAL | Status: DC
  Administered 2023-07-12 – 2023-07-13 (×2): 5 mg via ORAL
  Filled 2023-07-12 (×2): qty 1

## 2023-07-12 MED ORDER — METOPROLOL SUCCINATE ER 25 MG PO TB24
12.5000 mg | ORAL_TABLET | Freq: Every day | ORAL | Status: DC
  Administered 2023-07-12 – 2023-07-16 (×5): 12.5 mg via ORAL
  Filled 2023-07-12 (×5): qty 1

## 2023-07-12 MED ORDER — APIXABAN 2.5 MG PO TABS
2.5000 mg | ORAL_TABLET | Freq: Two times a day (BID) | ORAL | Status: DC
  Administered 2023-07-12 – 2023-07-16 (×9): 2.5 mg via ORAL
  Filled 2023-07-12 (×9): qty 1

## 2023-07-12 MED ORDER — ACETAMINOPHEN 325 MG PO TABS
650.0000 mg | ORAL_TABLET | Freq: Four times a day (QID) | ORAL | Status: DC | PRN
Start: 2023-07-12 — End: 2023-07-17
  Administered 2023-07-12 – 2023-07-16 (×2): 650 mg via ORAL
  Filled 2023-07-12 (×2): qty 2

## 2023-07-12 NOTE — Plan of Care (Signed)

## 2023-07-12 NOTE — Progress Notes (Shared)
 [facility] - Clinic Note  07/25/2023     CHIEF COMPLAINT Patient presents for No chief complaint on file.   HISTORY OF PRESENT ILLNESS: [name] is a 89 y.o. female who presents to the clinic today for:     Pt states   Referring physician: Cleotilde [name] HERO, NP [address] [place],  [place] [address]  HISTORICAL INFORMATION:   Selected notes from the MEDICAL RECORD NUMBER [name] pt -- transferring care due to insurance LEE: 09.07.23 -- BCVA CF OU Ocular Hx- ex ARMD OD -- s/p multiple IVA, last IVA OD 09.07.24 (4+ mos) PMH-    CURRENT MEDICATIONS: No current facility-administered medications for this visit. (Ophthalmic Drugs)   No current outpatient medications on file. (Ophthalmic Drugs)   No current facility-administered medications for this visit. (Other)   No current outpatient medications on file. (Other)   Facility-Administered Medications Ordered in Other Visits (Other)  Medication Route   acetaminophen  (TYLENOL ) tablet 650 mg Oral   Or   acetaminophen  (TYLENOL ) suppository 650 mg Rectal   amLODipine  (NORVASC ) tablet 5 mg Oral   apixaban  (ELIQUIS ) tablet 2.5 mg Oral   aspirin  EC tablet 81 mg Oral   Chlorhexidine  Gluconate Cloth 2 % PADS 6 each Topical   feeding supplement (ENSURE PLUS HIGH PROTEIN) liquid 237 mL Oral   furosemide  (LASIX ) injection 40 mg Intravenous   insulin  aspart (novoLOG ) injection 0-9 Units Subcutaneous   levothyroxine  (SYNTHROID ) tablet 125 mcg Oral   metoprolol  succinate (TOPROL -XL) 24 hr tablet 12.5 mg Oral   pantoprazole  (PROTONIX ) EC tablet 40 mg Oral   prochlorperazine  (COMPAZINE ) injection 10 mg Intravenous   rosuvastatin  (CRESTOR ) tablet 10 mg Oral   REVIEW OF SYSTEMS:    ALLERGIES Allergies  Allergen Reactions   Sotalol Other (See Comments)    Torsades    Tramadol  Other (See Comments)    Hallucination, bad-dream   Actos [Pioglitazone] Swelling   Adhesive [Tape] Other (See Comments)     Causes sores   Atorvastatin  Other (See Comments)    myalgia   Bactrim  [Sulfamethoxazole -Trimethoprim ] Itching and Rash    Itchy rash   Ciprofloxacin  Nausea Only   Contrast Media [Iodinated Contrast Media] Other (See Comments)    Headache (no action/pre-med required)   Latex Rash   Pedi-Pre Tape Spray [Wound Dressing Adhesive] Other (See Comments)    Causes sores   Sulfa  Antibiotics Other (See Comments)    Causes sores   PAST MEDICAL HISTORY Past Medical History:  Diagnosis Date   AICD (automatic cardioverter/defibrillator) present    Allergy    SESONAL   Aneurysm of infrarenal abdominal aorta (HCC)    Anxiety    ARTHRITIS    Arthritis    Atrial fibrillation (HCC)    CAD (coronary artery disease)    a. history of cardiac arrest 1993;  b. s/p LAD/LCX stenting in 2003;  c. 07/2011 Cath: LM nl, LAD patent stent, D1 80ost, LCX patent stent, RCA min irregs;  d. 04/2012 MV: EF 66%, no ishcemia;  e. 12/2013 Echo: Ef 55%, no rwma, Gr 1 DD, triv AI/MR, mildly dil LA;  f. 12/2013 Lexi MV: intermediate risk - apical ischemia and inf/infsept fixed defect, ? artifactual.   CAD in native artery 12/31/2013   Previous stents to LAD and Ramus patent on cath today 12/31/13 also There is severe disease in the ostial first diagonal which is unchanged from most recent cardiac catheterization. The right coronary artery could not be engaged selectively but  nonselective angiography showed no significant disease in the proximal and midsegment.       Cataract    DENIES   Chronic back pain    Chronic sinus bradycardia    CKD (chronic kidney disease), stage III (HCC)    Clotting disorder (HCC)    DVT   COLITIS 12/02/2007   Qualifier: Diagnosis of  By: [name] CMA [name]     Diabetes mellitus without complication (HCC)    DENIES   Diverticulosis of colon (without mention of hemorrhage) 2007   Colonoscopy    Esophageal stricture    a. 2012 s/p dil.   Esophagitis, unspecified    a. 2012 EGD    EXTERNAL HEMORRHOIDS    GERD (gastroesophageal reflux disease)    omeprazole   H/O hiatal hernia    Hiatal hernia    a. 2012 EGD.   History of DVT in the past, not on Coumadin now    left  leg   HYPERCHOLESTEROLEMIA    Hypertension    ICD (implantable cardiac defibrillator) in place    a. s/p initial ICD in 1993 in setting of cardiac arrest;  b. 01/2007 gen change: Guidant T135 Vitality DS VR single lead ICD.   Macular degeneration    gets injecton in eye every 5 weeks- last injection - 05/03/2013    Myocardial infarction [facility]) 1993   Near syncope 05/22/2015   Nephrolithiasis, just saw Dr [name]- [place]    Orthostatic hypotension    Peripheral vascular disease (HCC)    ???   RECTAL BLEEDING 12/03/2007   Qualifier: Diagnosis of  By: Jakie MD [name]    Unspecified gastritis and gastroduodenitis without mention of hemorrhage    a. 2003 EGD->not noted on 2012 EGD.   Unstable angina, neg MI, cath stable.maybe GI 12/30/2013   Past Surgical History:  Procedure Laterality Date   BREAST BIOPSY Left 11/2013   CARDIAC CATHETERIZATION     CARDIAC CATHETERIZATION  01/01/15   patent stents -there is severe disease in ostial 1st diag but without change.    CARDIAC DEFIBRILLATOR PLACEMENT  02/05/2007   Guidant   CARDIOVASCULAR STRESS TEST  12/30/2013   abnormal   CARDIOVERSION N/A 02/13/2019   Procedure: CARDIOVERSION;  Surgeon: Renee Headland, MD;  Location: [facility] ENDOSCOPY;  Service: Cardiovascular;  Laterality: N/A;   CATARACT EXTRACTION Right    Dr. Octavia   COLONOSCOPY     COLONOSCOPY WITH PROPOFOL  N/A 03/30/2020   Procedure: COLONOSCOPY WITH PROPOFOL ;  Surgeon: Renee Angelia Sieving, MD;  Location: [facility];  Service: Gastroenterology;  Laterality: N/A;   coronary stents      CYSTOSCOPY WITH URETEROSCOPY Right 05/20/2013   Procedure: CYSTOSCOPY, RIGHT URETEROSCOPY STONE EXTRACTION, Insertion of right DOUBLE J STENT ;  Surgeon: Renee Watt, MD;  Location: [facility] ORS;  Service:  Urology;  Laterality: Right;   CYSTOSCOPY WITH URETEROSCOPY AND STENT PLACEMENT N/A 06/03/2013   Procedure: SECOND LOOK CYSTOSCOPY WITH URETEROSCOPY  HOLMIUM LASER LITHO AND STONE EXTRACTION [name] ;  Surgeon: Renee JINNY Watt, MD;  Location: [facility] OR[facility];  Service: Urology;  Laterality: N/A;   ESOPHAGOGASTRODUODENOSCOPY (EGD) WITH PROPOFOL  N/A 11/20/2019   Procedure: ESOPHAGOGASTRODUODENOSCOPY (EGD) WITH PROPOFOL ;  Surgeon: Shaaron Lamar HERO, MD;  Location: [facility];  Service: Endoscopy;  Laterality: N/A;   GIVENS CAPSULE STUDY N/A 10/30/2016   Procedure: GIVENS CAPSULE STUDY;  Surgeon: Abran Renee SAILOR, MD;  Location: [facility] ENDOSCOPY;  Service: Endoscopy;  Laterality: N/A;  NEEDS TO BE ADMITTED FOR OBSERVATION  PT. HAS DEFIB   HEMORROIDECTOMY  80's   HOLMIUM LASER APPLICATION Right 05/20/2013   Procedure: HOLMIUM LASER APPLICATION;  Surgeon: Renee Seltzer, MD;  Location: [facility];  Service: Urology;  Laterality: Right;   HYSTEROSCOPY WITH D & C  09/02/2010   Procedure: DILATATION AND CURETTAGE (D&C) /HYSTEROSCOPY;  Surgeon: Charlie CHRISTELLA Croak;  Location: [facility] ORS;  Service: Gynecology;  Laterality: N/A;  Dilation and Curettage with Hysteroscopy and Polypectomy   IMPLANTABLE CARDIOVERTER DEFIBRILLATOR (ICD) GENERATOR CHANGE N/A 02/10/2014   Procedure: ICD GENERATOR CHANGE;  Surgeon: Jerel Balding, MD;  Location: [facility] CATH LAB;  Service: Cardiovascular;  Laterality: N/A;   INTRAMEDULLARY (IM) NAIL INTERTROCHANTERIC Left 09/09/2022   Procedure: INTRAMEDULLARY (IM) NAIL INTERTROCHANTERIC;  Surgeon: Ernie Cough, MD;  Location: [facility];  Service: Orthopedics;  Laterality: Left;   LEFT HEART CATHETERIZATION WITH CORONARY ANGIOGRAM N/A 07/28/2011   Procedure: LEFT HEART CATHETERIZATION WITH CORONARY ANGIOGRAM;  Surgeon: Dorn JINNY Lesches, MD;  Location: [facility] CATH LAB;  Service: Cardiovascular;  Laterality: N/A;   LEFT HEART CATHETERIZATION WITH CORONARY ANGIOGRAM N/A 12/31/2013   Procedure: LEFT HEART CATHETERIZATION WITH CORONARY  ANGIOGRAM;  Surgeon: Deatrice DELENA Cage, MD;  Location: [facility] CATH LAB;  Service: Cardiovascular;  Laterality: N/A;   FAMILY HISTORY Family History  Problem Relation Age of Onset   Stroke Mother    Other Mother        brain tumor   Other Father        MI   Heart attack Father    Stroke Sister    Macular degeneration Sister    Diabetes Daughter    Cancer Daughter        ovarian   Colon polyps Daughter    Atrial fibrillation Sister    Hyperlipidemia Sister    Osteoporosis Sister    Stroke Sister    Uterine cancer Sister    Heart attack Brother    Heart disease Brother    Asthma Brother    Breast cancer Neg Hx    SOCIAL HISTORY Social History   Tobacco Use   Smoking status: Never   Smokeless tobacco: Never  Vaping Use   Vaping status: Never Used  Substance Use Topics   Alcohol  use: No   Drug use: No       OPHTHALMIC EXAM:  Not recorded    IMAGING AND PROCEDURES  Imaging and Procedures for 07/25/2023           ASSESSMENT/PLAN:  No diagnosis found.   1. Exudative age related macular degeneration, right eye  - delayed f/u - 5.9 wks instead of 4 due to weather/snow on 02.24.25 - delayed follow up from 8 weeks to 5 months (July 18, 2022 to December 25, 2022) due to hip fracture, sx and rehab - previously managed by Dr. Elner - last visit and injection w/ [name] was 09.07.2023 (IVA OD) -- pt was on a q6-8 wk maintenance schedule -- presented at 4+ mos since last IVA OD w/ [name] - s/p IVA OD #1 (01.30.24), #2 (03.12.24), #3 (05.07.24), #4 (07.02.24), #5 (12.09.24), #6 (01.14.25), #7 (02.24.25), #8 (03.28.25) -- IVA resistance ================================= - s/p IVE OD (05.02.2025), #2 (06.05.25)  - BCVA OD stable at 20/100  - OCT OD shows: Central ORA with scattered PED / [facility] -- PED/SRHM nasal fovea and PED temporal macula -- slightly improved at 4 weeks  - exam shows persistent IRH temporal macula - recommend IVE OD #3 today, 07.09.25 for persistent  hemorrhage with follow up in 4-5 wks  -  pt wishes to proceed with injection  - RBA of procedure discussed, questions answered - IVE informed consent obtained and signed 01.30.24 - see procedure note  - f/u in 4-5 wks, DFE, OCT, possible injxn  2. Age related macular degeneration, non-exudative, left eye  - advanced stage with large central area of GA  - BCVA stable at from 20/150 - The incidence, anatomy, and pathology of dry AMD, risk of progression, and the AREDS and AREDS 2 [id number] including smoking risks discussed with patient.  - Recommend amsler grid monitoring  3,4. Hypertensive retinopathy OU - discussed importance of tight BP control - continue to monitor  5. Pseudophakia OU  - s/p CE/IOL OU (Dr. JAYSON Gaudy)  - IOLs in good position, doing well  - continue to monitor  Ophthalmic Meds Ordered this visit:  No orders of the defined types were placed in this encounter.    No follow-ups on file.  There are no Patient Instructions on file for this visit.  This document serves as a record of services personally performed by Redell JUDITHANN Hans, MD, PhD. It was created on their behalf by Almetta Pesa, an ophthalmic technician. The creation of this record is the provider's dictation and/or activities during the visit.    Electronically signed by: Almetta Pesa, OA, 07/12/23  10:52 AM   Redell JUDITHANN Hans, M.D., Ph.D. Diseases & Surgery of the Retina and Vitreous [facility] 06/22/2023    Abbreviations: M myopia (nearsighted); A astigmatism; H hyperopia (farsighted); P presbyopia; Mrx spectacle prescription;  CTL contact lenses; OD right eye; OS left eye; OU both eyes  XT exotropia; ET esotropia; PEK punctate epithelial keratitis; PEE punctate epithelial erosions; DES dry eye syndrome; MGD meibomian gland dysfunction; ATs artificial tears; PFAT's preservative free artificial tears; NSC nuclear sclerotic cataract; PSC posterior subcapsular cataract; ERM  epi-retinal membrane; PVD posterior vitreous detachment; RD retinal detachment; DM diabetes mellitus; DR diabetic retinopathy; NPDR non-proliferative diabetic retinopathy; PDR proliferative diabetic retinopathy; CSME clinically significant macular edema; DME diabetic macular edema; dbh dot blot hemorrhages; CWS cotton wool spot; POAG primary open angle glaucoma; C/D cup-to-disc ratio; HVF humphrey visual field; GVF goldmann visual field; OCT optical coherence tomography; IOP intraocular pressure; BRVO Branch retinal vein occlusion; CRVO central retinal vein occlusion; CRAO central retinal artery occlusion; BRAO branch retinal artery occlusion; RT retinal tear; SB scleral buckle; PPV pars plana vitrectomy; VH Vitreous hemorrhage; PRP panretinal laser photocoagulation; IVK intravitreal kenalog ; VMT vitreomacular traction; MH Macular hole;  NVD neovascularization of the disc; NVE neovascularization elsewhere; AREDS age related eye disease study; ARMD age related macular degeneration; POAG primary open angle glaucoma; EBMD epithelial/anterior basement membrane dystrophy; ACIOL anterior chamber intraocular lens; IOL intraocular lens; PCIOL posterior chamber intraocular lens; Phaco/IOL phacoemulsification with intraocular lens placement; PRK photorefractive keratectomy; LASIK laser assisted in situ keratomileusis; HTN hypertension; DM diabetes mellitus; COPD chronic obstructive pulmonary disease

## 2023-07-12 NOTE — Progress Notes (Signed)
 PROGRESS NOTE    [name]  FMW:[id number] DOB: April 01, 1931 DOA: 07/11/2023 PCP: Cleotilde Laymon HERO, NP   Brief Narrative:    [name] is a 89 y.o. female with medical history significant of chronic atrial fibrillation, CAD s/p stenting, CKD stage IIIA, frequent V. tach SP AICD placement who presents to the emergency department due to generalized weakness, shortness of breath, increased swelling in the legs and vomiting x 1 PTA.  She was recently admitted to [facility] from 6/14 to 6/18 due to hyponatremia and enterocolitis, diarrhea has since resolved, but she continued to complain of weakness.  Patient was admitted with symptomatic hyponatremia as well as acute on chronic diastolic CHF exacerbation and has been started on IV diuresis.  Assessment & Plan:   Principal Problem:   Hyponatremia Active Problems:   Hyperlipidemia associated with type 2 diabetes mellitus (HCC)   Persistent atrial fibrillation (HCC)   CAD (coronary artery disease)   Acquired hypothyroidism   Prolonged QT interval   Elevated brain natriuretic peptide (BNP) level   Transaminitis   Type 2 diabetes mellitus with hyperglycemia (HCC)  Assessment and Plan:  Hyponatremia Na 119 > 120 This may be dilutional considering slight increase in sodium after Lasix  Urine osmolality, serum osmolality and urine sodium will be checked along with TSH   Acute on chronic diastolic CHF exacerbation Prior 2D echocardiogram with grade 1 diastolic dysfunction and preserved LVEF last seen 08/2022 BNP 2,362 Continue total input/output, daily weights and fluid restriction Continue IV Lasix  40 mg twice daily Continue heart healthy/carb modified diet       Echocardiogram in the morning    Transaminitis AST 103, ALT 80 This may be due to congestive hepatopathy Continue IV Lasix  40 mg twice daily   Type 2 diabetes mellitus with hyperglycemia Hemoglobin A1c on 05/24/2023 was 6.5 Continue ISS and hypoglycemia protocol    Persistent atrial fibrillation Continue Toprol , Eliquis  Amiodarone  is temporarily held due to prolonged QT interval   CAD Continue aspirin , Toprol  XL per home regimen   Acquired hypothyroidism Continue Synthroid  Check TSH as above   Mixed hyperlipidemia Continue Crestor   DVT prophylaxis:apixaban  Code Status: DNR Family Communication: None at bedside Disposition Plan:  Status is: Inpatient Remains inpatient appropriate because: Need for IV medications.   Consultants:  None  Procedures:  None  Antimicrobials:  None   Subjective: Patient seen and evaluated today with no new acute complaints or concerns. No acute concerns or events noted overnight.  Patient appears fatigued and is overall poorly responsive.  Objective: Vitals:   07/12/23 0412 07/12/23 0700 07/12/23 0800 07/12/23 0808  BP:  (!) 137/59 (!) 146/64 (!) 146/64  Pulse: (!) 56 (!) 56 (!) 59 (!) 59  Resp: 16 14 (!) 26   Temp: (!) 97.5 F (36.4 C) 97.6 F (36.4 C)    TempSrc: Axillary Oral    SpO2: 100% 100% 100%   Weight:      Height:        Intake/Output Summary (Last 24 hours) at 07/12/2023 1124 Last data filed at 07/12/2023 0715 Gross per 24 hour  Intake --  Output 450 ml  Net -450 ml   Filed Weights   07/11/23 2019 07/12/23 0047  Weight: 49 kg 63.6 kg    Examination:  General exam: Appears calm and comfortable, fatigued Respiratory system: Clear to auscultation. Respiratory effort normal. Cardiovascular system: S1 & S2 heard, RRR.  Gastrointestinal system: Abdomen is soft Central nervous system: Alert and awake Extremities: No edema Skin:  No significant lesions noted Psychiatry: Flat affect.    Data Reviewed: I have personally reviewed following labs and imaging studies  CBC: Recent Labs  Lab 07/11/23 2046 07/12/23 0551  WBC 6.3 6.6  HGB 8.8* 8.5*  HCT 27.3* 25.0*  MCV 81.7 81.2  PLT 244 246   Basic Metabolic Panel: Recent Labs  Lab 07/11/23 2046 07/12/23 0551  NA  119* 120*  K 4.4 3.8  CL 85* 89*  CO2 21* 22  GLUCOSE 194* 146*  BUN 17 16  CREATININE 1.13* 1.11*  CALCIUM  8.9 8.6*  MG  --  1.7  PHOS  --  3.7   GFR: Estimated Creatinine Clearance: 30.9 mL/min (A) (by C-G formula based on SCr of 1.11 mg/dL (H)). Liver Function Tests: Recent Labs  Lab 07/11/23 2046 07/12/23 0551  AST 103* 112*  ALT 80* 79*  ALKPHOS 80 74  BILITOT 0.6 0.6  PROT 6.5 6.1*  ALBUMIN 3.3* 3.1*   No results for input(s): LIPASE, AMYLASE in the last 168 hours. No results for input(s): AMMONIA in the last 168 hours. Coagulation Profile: No results for input(s): INR, PROTIME in the last 168 hours. Cardiac Enzymes: No results for input(s): CKTOTAL, CKMB, CKMBINDEX, TROPONINI in the last 168 hours. BNP (last 3 results) No results for input(s): PROBNP in the last 8760 hours. HbA1C: No results for input(s): HGBA1C in the last 72 hours. CBG: Recent Labs  Lab 07/12/23 0113  GLUCAP 203*   Lipid Profile: No results for input(s): CHOL, HDL, LDLCALC, TRIG, CHOLHDL, LDLDIRECT in the last 72 hours. Thyroid  Function Tests: No results for input(s): TSH, T4TOTAL, FREET4, T3FREE, THYROIDAB in the last 72 hours. Anemia Panel: No results for input(s): VITAMINB12, FOLATE, FERRITIN, TIBC, IRON , RETICCTPCT in the last 72 hours. Sepsis Labs: No results for input(s): PROCALCITON, LATICACIDVEN in the last 168 hours.  Recent Results (from the past 240 hours)  MRSA Next Gen by PCR, Nasal     Status: None   Collection Time: 07/11/23 11:50 PM   Specimen: Nasal Mucosa; Nasal Swab  Result Value Ref Range Status   MRSA by PCR Next Gen NOT DETECTED NOT DETECTED Final    Comment: (NOTE) The GeneXpert MRSA Assay (FDA approved for NASAL specimens only), is one component of a comprehensive MRSA colonization surveillance program. It is not intended to diagnose MRSA infection nor to guide or monitor treatment for MRSA  infections. Test performance is not FDA approved in patients less than 56 years old. Performed at [facility], [address], [place], [place] [address]          Radiology Studies: [facility] 1 View Result Date: 07/11/2023 CLINICAL DATA:  Chest pain EXAM: PORTABLE CHEST 1 VIEW COMPARISON:  Chest x-ray 12/09/2021 FINDINGS: Left-sided ICD present. The heart is enlarged the aorta is tortuous, unchanged. There central pulmonary vascular congestion and central interstitial prominence. There some mild patchy airspace opacities in the lower lungs and a small left pleural effusion. There is no pneumothorax or acute fracture. IMPRESSION: 1. Cardiomegaly with central pulmonary vascular congestion and interstitial edema. 2. Mild patchy airspace opacities in the lower lungs and small left pleural effusion. Electronically Signed   By: Greig Pique M.D.   On: 07/11/2023 21:22        Scheduled Meds:  amLODipine   5 mg Oral Daily   apixaban   2.5 mg Oral BID   aspirin  EC  81 mg Oral Daily   Chlorhexidine  Gluconate Cloth  6 each Topical Daily   feeding supplement  237 mL Oral BID BM   furosemide   40 mg Intravenous Q12H   insulin  aspart  0-9 Units Subcutaneous TID WC   levothyroxine   125 mcg Oral Daily   metoprolol  succinate  12.5 mg Oral Daily   pantoprazole   40 mg Oral BID AC   rosuvastatin   10 mg Oral Daily     LOS: 1 day    Time spent: 55 minutes    [name] Renee Fairly, DO [facility]  If 7PM-7AM, please contact night-coverage [email or web address] 07/12/2023, 11:24 AM

## 2023-07-12 NOTE — TOC Initial Note (Signed)
 Transition of Care [facility]) - Initial/Assessment Note    Patient Details  Name: [name] MRN: [id number] Date of Birth: 01-26-1931  Transition of Care [facility]) CM/SW Contact:    Noreen KATHEE Pinal, LCSWA Phone Number: 07/12/2023, 12:18 PM  Clinical Narrative:                  Patient is at risk for readmission due to high admission score. Patient was admitted for Hyponatremia . CSW spoke with son via phone since patient was sleeping and no one was at bedside when visiting her this morning. Son [name] stated that patient has be aide from 9am-3pm and a lady she pays to stay the night with her. Son states that patient is active with [facility] in the community and they assist with alternative services. Son shared that they have a meeting with [facility] doctor to discuss possibly 24/7 care on Monday at [facility]. Son states that patient has a walker, WC, ramp, 3N1, and Lift recliner. CSW shared that palliative was mentioned from MD this morning and son shared that if it recommended they will working with [facility] to get it setup up. TOC to follow.     Barriers to Discharge: Continued Medical Work up   Patient Goals and CMS Choice Patient states their goals for this hospitalization and ongoing recovery are:: return back home          Expected Discharge Plan and Services In-house Referral: Clinical Social Work   Post Acute Care Choice: Durable Medical Equipment Living arrangements for the past 2 months: Single Family Home                                      Prior Living Arrangements/Services Living arrangements for the past 2 months: Single Family Home Lives with:: Self, Other (Comment) (Lady that patient has hired comes out to stay with her until Aide comes in.) Patient language and need for interpreter reviewed:: Yes Do you feel safe going back to the place where you live?: Yes      Need for Family Participation in Patient Care: Yes (Comment) Care giver support system in place?: Yes  (comment) Current home services: [facility], DME Criminal Activity/Legal Involvement Pertinent to Current Situation/Hospitalization: No - Comment as needed  Activities of Daily Living   ADL Screening (condition at time of admission) Independently performs ADLs?: No Does the patient have a NEW difficulty with bathing/dressing/toileting/self-feeding that is expected to last >3 days?: Yes (Initiates electronic notice to provider for possible OT consult) Does the patient have a NEW difficulty with getting in/out of bed, walking, or climbing stairs that is expected to last >3 days?: Yes (Initiates electronic notice to provider for possible PT consult) Does the patient have a NEW difficulty with communication that is expected to last >3 days?: No Is the patient deaf or have difficulty hearing?: Yes Does the patient have difficulty seeing, even when wearing glasses/contacts?: Yes Does the patient have difficulty concentrating, remembering, or making decisions?: No  Permission Sought/Granted      Share Information with NAME: [name]     Permission granted to share info w Relationship: Son     Emotional Assessment Appearance:: Appears stated age     Orientation: : Oriented to Self Alcohol  / Substance Use: Not Applicable Psych Involvement: No (comment)  Admission diagnosis:  Hyponatremia [E87.1] Acute congestive heart failure, unspecified heart failure type [facility]) [I50.9] Patient Active Problem List  Diagnosis Date Noted   Elevated brain natriuretic peptide (BNP) level 07/12/2023   Transaminitis 07/12/2023   Type 2 diabetes mellitus with hyperglycemia (HCC) 07/12/2023   Closed left hip fracture (HCC) 09/08/2022   Stroke-like symptoms 12/12/2021   Contusion of rib, right, initial encounter 12/09/2021   Fall at home, initial encounter 12/09/2021   Nuclear sclerotic cataract of left eye 08/09/2021   Hypertensive kidney disease with stage 3b chronic kidney disease (HCC) 04/04/2021   Weight  loss 03/21/2021   Palliative care encounter 03/21/2021   TIA (transient ischemic attack) 02/03/2021   Cellulitis and abscess of left lower extremity 06/21/2020   Cellulitis of left leg 06/20/2020   Loss of weight 05/18/2020   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   Intractable abdominal pain 03/28/2020   Lactic acidosis 03/28/2020   Elevated troponin I level 03/28/2020   Malnutrition of moderate degree 12/26/2019   Prolonged QT interval 12/23/2019   Acute lower UTI 12/23/2019   Constipation 11/27/2019   Diarrhea 11/27/2019   Pancreatic lesion 11/26/2019   Fibromuscular dysplasia of right renal artery (HCC) 11/26/2019   Left renal artery stenosis (HCC) 11/26/2019   RUQ pain    Abnormal CT of the abdomen    Vertigo 11/15/2019   Advanced care planning/counseling discussion 11/15/2019   CAD (coronary artery disease)    Acquired hypothyroidism    Hyponatremia    Volume depletion    TSH elevation 08/01/2019   Hospital discharge follow-up 08/01/2019   Exudative age-related macular degeneration of right eye with active choroidal neovascularization (HCC) 04/30/2019   Retinal hemorrhage of right eye 04/30/2019   Advanced nonexudative age-related macular degeneration of left eye with subfoveal involvement 04/30/2019   Exudative age-related macular degeneration of left eye with inactive choroidal neovascularization (HCC) 04/30/2019   Advanced nonexudative age-related macular degeneration of right eye with subfoveal involvement 04/30/2019   Posterior vitreous detachment of both eyes 04/30/2019   Vitreous degeneration, bilateral 04/30/2019   Secondary hypercoagulable state (HCC) 03/11/2019   Persistent atrial fibrillation (HCC)    Proctitis 01/09/2019   Atrial fibrillation, chronic (HCC) 01/09/2019   Hyperlipidemia 01/09/2019   Nausea 01/09/2019   Hyperglycemia 01/09/2019   COVID-19 virus infection 12/28/2018   Chronic diarrhea 09/18/2018   Abdominal pain 09/18/2018   Osteoarthritis of  right knee 03/29/2018   GERD (gastroesophageal reflux disease)    Dehydration 02/06/2018   Hypokalemia 02/06/2018   Diarrhea of presumed infectious origin 02/06/2018   Acute kidney injury superimposed on CKD (HCC) 02/05/2018   Essential hypertension 12/21/2017   Degenerative scoliosis 02/19/2017   Burst fracture of lumbar vertebra (HCC) 02/19/2017   History of drug-induced prolonged QT interval with torsade de pointes 11/01/2016   Iron  deficiency anemia 10/30/2016   Long term current use of anticoagulant 02/17/2016   Near syncope 05/22/2015   Diabetes mellitus type 2, diet-controlled (HCC) 09/14/2014   ICD (implantable cardioverter-defibrillator) battery depletion 01/20/2014   Abnormal nuclear stress test, 12/30/13 12/31/2013   Coronary artery disease involving native coronary artery of native heart without angina pectoris 12/31/2013   Paroxysmal atrial fibrillation, chads2 Vasc2 score of 5, on eliquis  10/16/2013   Catecholaminergic polymorphic ventricular tachycardia (HCC) 10/16/2013   Ureteral stone with hydronephrosis 05/20/2013   Metabolic syndrome 05/07/2013   Orthostatic hypotension 07/27/2011   Chest pain 07/25/2011   Weakness 07/25/2011    Class: Acute   Chronic sinus bradycardia 07/25/2011   ICD (implantable cardioverter-defibrillator) in place 07/25/2011   CKD (chronic kidney disease), stage III (HCC) 07/25/2011    Class: Chronic  History of DVT in the past, on eliquis  now 07/25/2011    Class: History of   Nephrolithiasis, just saw Dr Watt- [place] 07/25/2011    Class: History of   DYSPHAGIA 03/24/2010   Chronic ischemic heart disease 12/03/2007   Irritable bowel syndrome with diarrhea 12/03/2007   EPIGASTRIC PAIN 12/03/2007   Hyperlipidemia associated with type 2 diabetes mellitus (HCC) 12/02/2007    Class: History of   EXTERNAL HEMORRHOIDS 12/02/2007    Class: History of   ESOPHAGITIS 12/02/2007   GASTRITIS 12/02/2007   DIVERTICULOSIS, COLON 12/02/2007    ARTHRITIS 12/02/2007    Class: Chronic   PCP:  Cleotilde Laymon HERO, NP Pharmacy:   [facility] - [place], [place] - [address] [place] [address] Phone: [phone] Fax: [phone]  [facility] [place], [place] - [address] [place] [address] Phone: [phone] Fax: [phone]     Social Drivers of Health (SDOH) Social History: SDOH Screenings   Food Insecurity: No Food Insecurity (07/12/2023)  Housing: Low Risk  (07/12/2023)  Transportation Needs: No Transportation Needs (07/12/2023)  Utilities: Not At Risk (07/12/2023)  Alcohol  Screen: Low Risk  (09/27/2020)  Depression (PHQ2-9): Low Risk  (07/11/2021)  Financial Resource Strain: Medium Risk (07/02/2023)   Received from [facility]  Physical Activity: Inactive (03/15/2021)  Social Connections: Moderately Isolated (09/27/2020)  Stress: Stress Concern Present (03/15/2021)  Tobacco Use: Low Risk  (07/12/2023)   SDOH Interventions:     Readmission Risk Interventions    07/12/2023   12:13 PM  Readmission Risk Prevention Plan  Transportation Screening Complete  HRI or Home Care Consult Complete  Social Work Consult for Recovery Care Planning/Counseling Complete  Palliative Care Screening Not Applicable  Medication Review Ocean[employer]) Complete

## 2023-07-12 NOTE — Plan of Care (Signed)

## 2023-07-13 ENCOUNTER — Ambulatory Visit: Payer: Medicare (Managed Care) | Admitting: Physician Assistant

## 2023-07-13 DIAGNOSIS — I5021 Acute systolic (congestive) heart failure: Secondary | ICD-10-CM | POA: Diagnosis not present

## 2023-07-13 DIAGNOSIS — I480 Paroxysmal atrial fibrillation: Secondary | ICD-10-CM | POA: Diagnosis not present

## 2023-07-13 DIAGNOSIS — I2510 Atherosclerotic heart disease of native coronary artery without angina pectoris: Secondary | ICD-10-CM

## 2023-07-13 DIAGNOSIS — E871 Hypo-osmolality and hyponatremia: Secondary | ICD-10-CM | POA: Diagnosis not present

## 2023-07-13 LAB — BASIC METABOLIC PANEL WITH GFR
Anion gap: 11 (ref 5–15)
BUN: 14 mg/dL (ref 8–23)
CO2: 22 mmol/L (ref 22–32)
Calcium: 8.2 mg/dL — ABNORMAL LOW (ref 8.9–10.3)
Chloride: 95 mmol/L — ABNORMAL LOW (ref 98–111)
Creatinine, Ser: 0.99 mg/dL (ref 0.44–1.00)
GFR, Estimated: 55 mL/min — ABNORMAL LOW (ref >60)
Glucose, Bld: 124 mg/dL — ABNORMAL HIGH (ref 70–99)
Potassium: 3.9 mmol/L (ref 3.5–5.1)
Sodium: 128 mmol/L — ABNORMAL LOW (ref 135–145)

## 2023-07-13 LAB — CBC
HCT: 24.8 % — ABNORMAL LOW (ref 36.0–46.0)
Hemoglobin: 8.3 g/dL — ABNORMAL LOW (ref 12.0–15.0)
MCH: 27.6 pg (ref 26.0–34.0)
MCHC: 33.5 g/dL (ref 30.0–36.0)
MCV: 82.4 fL (ref 80.0–100.0)
Platelets: 254 10*3/uL (ref 150–400)
RBC: 3.01 MIL/uL — ABNORMAL LOW (ref 3.87–5.11)
RDW: 16.2 % — ABNORMAL HIGH (ref 11.5–15.5)
WBC: 13.7 10*3/uL — ABNORMAL HIGH (ref 4.0–10.5)
nRBC: 0.2 % (ref 0.0–0.2)

## 2023-07-13 LAB — GLUCOSE, CAPILLARY
Glucose-Capillary: 101 mg/dL — ABNORMAL HIGH (ref 70–99)
Glucose-Capillary: 134 mg/dL — ABNORMAL HIGH (ref 70–99)
Glucose-Capillary: 167 mg/dL — ABNORMAL HIGH (ref 70–99)
Glucose-Capillary: 166 mg/dL — ABNORMAL HIGH (ref 70–99)

## 2023-07-13 LAB — SODIUM
Sodium: 123 mmol/L — ABNORMAL LOW (ref 135–145)
Sodium: 124 mmol/L — ABNORMAL LOW (ref 135–145)
Sodium: 127 mmol/L — ABNORMAL LOW (ref 135–145)

## 2023-07-13 LAB — MAGNESIUM: Magnesium: 1.9 mg/dL (ref 1.7–2.4)

## 2023-07-13 MED ORDER — AMIODARONE HCL 200 MG PO TABS
200.0000 mg | ORAL_TABLET | Freq: Every day | ORAL | Status: DC
Start: 2023-07-14 — End: 2023-07-16
  Administered 2023-07-14 – 2023-07-16 (×3): 200 mg via ORAL
  Filled 2023-07-13 (×3): qty 1

## 2023-07-13 MED ORDER — FUROSEMIDE 10 MG/ML IJ SOLN
40.0000 mg | Freq: Once | INTRAMUSCULAR | Status: AC
  Administered 2023-07-13: 40 mg via INTRAVENOUS
  Filled 2023-07-13: qty 4

## 2023-07-13 MED ORDER — LATANOPROST 0.005 % OP SOLN
1.0000 [drp] | Freq: Every day | OPHTHALMIC | Status: DC
  Administered 2023-07-13 – 2023-07-16 (×4): 1 [drp] via OPHTHALMIC
  Filled 2023-07-13 (×2): qty 2.5

## 2023-07-13 MED ORDER — FUROSEMIDE 10 MG/ML IJ SOLN
40.0000 mg | Freq: Two times a day (BID) | INTRAMUSCULAR | Status: DC
  Administered 2023-07-13 – 2023-07-15 (×5): 40 mg via INTRAVENOUS
  Filled 2023-07-13 (×6): qty 4

## 2023-07-13 MED ORDER — POLYVINYL ALCOHOL 1.4 % OP SOLN: 1.0000 [drp] | Freq: Three times a day (TID) | OPHTHALMIC | Status: DC | PRN

## 2023-07-13 NOTE — Consult Note (Addendum)
 Cardiology Consultation   Patient ID: [name] MRN: [id number]; DOB: 10/25/1931  Admit date: 07/11/2023 Date of Consult: 07/13/2023  PCP:  Cleotilde Laymon HERO, NP   [facility] Providers Cardiologist:  Jerel Balding, MD     Patient Profile: [name] is a 89 y.o. female with a hx of polymorphic VT (s/p ICD placement in 1993 with most recent gen change in 2016), LBBB, paroxysmal atrial fibrillation, CAD (s/p stenting to LAD and LCx in 2003 with patent stents by cath in 2015), HTN, HLD, Stage 3 CKD, Type 2 DM and orthostatic hypotension who is being seen 07/13/2023 for the evaluation of new cardiomyopathy at the request of Dr. Maree.  History of Present Illness: [name] was last examined by Dr. Balding in 05/2023 and by review of his notes they were trying to avoid ventricular pacing by keeping her lower rate limit low at 45 bpm and decreasing her beta-blocker. She had declined upgrade of her device to dual-chamber device and told him at that time she did not wish to undergo invasive procedure such as a cardiac catheterization. No changes were made to her medications and she was continued on Amiodarone  200 mg daily, Amlodipine  5 mg daily, Eliquis  2.5 mg twice daily, ASA 81 mg daily, Imdur  60 mg daily, Toprol -XL 12.5 mg daily and Crestor  10 mg daily.  By review of Care Everywhere, she was hospitalized at [facility] from 6/14 - 07/04/2023 for hyponatremia and felt to be due to poor PO intake. Also diagnosed with enteritis during admission. She did have an echocardiogram at that time which showed her EF was reduced at 40 to 45% and she did have moderate MR, mild AI, low normal RV function, moderate TR and moderate pulmonary hypertension. Appears she was started on Lasix  20 mg daily at discharge while being continued on her same cardiac medications.  She presented to [facility] ED on 07/11/2023 for evaluation of weakness, vomiting and decreased appetite. Was hypoxic upon  EMS arrival with saturations of 88% on room air and this improved with supplemental oxygen. Initial labs showed WBC 6.3, Hgb 8.8 (Hgb at 9.8 on 6/15), platelets 244, Na+ 119 (had improved to 134 on 6/18), K+ 4.4 and creatinine 1.13.  BNP elevated at 2362.  Initial and repeat Hs negative at 11 and 13. TSH at 36.741. EKG shows sinus bradycardia, HR 58 with 1st degree AV Block and LBBB. CXR showed cardiomegaly with central pulmonary vascular congestion and interstitial edema along with mild patchy airspace opacities in the lower lungs and a small left pleural effusion.  She was admitted for further management of hyponatremia. Was started on IV Lasix  40mg  BID on admission given her elevated BNP. Amiodarone  was held on admission given prolonged QT (computer generated QTC of 506 ms but when manually calculated given her LBBB is closer to 471 ms).   Most recent labs today show Na+ has improved to 128. Creatinine at 0.99. Hgb at 8.3. Echo yesterday showed her EF was reduced at 35% with septal dyskinesis and HK worse along the inferior, distal anterior, lateral and apical walls. RV function was normal and she did have mild to moderate MR, moderate TR and mild AI.   In talking with the patient today, she reports having lower extremity edema prior to admission. Denies any specific dyspnea on exertion, orthopnea or PND. Uses a walker for ambulation around her home and has an aide that helps throughout the day. Family members help with grocery shopping as she does  not typically leave the house except to go to medical appointments. She denies any exertional chest pain. Does report having occasional chest pain at night but has not had to utilize nitroglycerin . Sleeps with 1 pillow at night. Tries to consume at least 3-4 bottles of water  a day and does limit her sodium intake. Says that she overall does not have a great appetite most days.  Past Medical History:  Diagnosis Date   AICD (automatic  cardioverter/defibrillator) present    Allergy    SESONAL   Aneurysm of infrarenal abdominal aorta (HCC)    Anxiety    ARTHRITIS    Arthritis    Atrial fibrillation (HCC)    CAD (coronary artery disease)    a. history of cardiac arrest 1993;  b. s/p LAD/LCX stenting in 2003;  c. 07/2011 Cath: LM nl, LAD patent stent, D1 80ost, LCX patent stent, RCA min irregs;  d. 04/2012 MV: EF 66%, no ishcemia;  e. 12/2013 Echo: Ef 55%, no rwma, Gr 1 DD, triv AI/MR, mildly dil LA;  f. 12/2013 Lexi MV: intermediate risk - apical ischemia and inf/infsept fixed defect, ? artifactual.   CAD in native artery 12/31/2013   Previous stents to LAD and Ramus patent on cath today 12/31/13 also There is severe disease in the ostial first diagonal which is unchanged from most recent cardiac catheterization. The right coronary artery could not be engaged selectively but nonselective angiography showed no significant disease in the proximal and midsegment.       Cataract    DENIES   Chronic back pain    Chronic sinus bradycardia    CKD (chronic kidney disease), stage III (HCC)    Clotting disorder (HCC)    DVT   COLITIS 12/02/2007   Qualifier: Diagnosis of  By: Kowalk CMA LEODIS), Chick     Diabetes mellitus without complication (HCC)    DENIES   Diverticulosis of colon (without mention of hemorrhage) 2007   Colonoscopy    Esophageal stricture    a. 2012 s/p dil.   Esophagitis, unspecified    a. 2012 EGD   EXTERNAL HEMORRHOIDS    GERD (gastroesophageal reflux disease)    omeprazole   H/O hiatal hernia    Hiatal hernia    a. 2012 EGD.   History of DVT in the past, not on Coumadin now    left  leg   HYPERCHOLESTEROLEMIA    Hypertension    ICD (implantable cardiac defibrillator) in place    a. s/p initial ICD in 1993 in setting of cardiac arrest;  b. 01/2007 gen change: Guidant T135 Vitality DS VR single lead ICD.   Macular degeneration    gets injecton in eye every 5 weeks- last injection - 05/03/2013     Myocardial infarction [facility]) 1993   Near syncope 05/22/2015   Nephrolithiasis, just saw Dr Watt- [place]    Orthostatic hypotension    Peripheral vascular disease (HCC)    ???   RECTAL BLEEDING 12/03/2007   Qualifier: Diagnosis of  By: Jakie MD NOLIA Lenis R    Unspecified gastritis and gastroduodenitis without mention of hemorrhage    a. 2003 EGD->not noted on 2012 EGD.   Unstable angina, neg MI, cath stable.maybe GI 12/30/2013    Past Surgical History:  Procedure Laterality Date   BREAST BIOPSY Left 11/2013   CARDIAC CATHETERIZATION     CARDIAC CATHETERIZATION  01/01/15   patent stents -there is severe disease in ostial 1st diag but without change.  CARDIAC DEFIBRILLATOR PLACEMENT  02/05/2007   Guidant   CARDIOVASCULAR STRESS TEST  12/30/2013   abnormal   CARDIOVERSION N/A 02/13/2019   Procedure: CARDIOVERSION;  Surgeon: Francyne Headland, MD;  Location: [facility] ENDOSCOPY;  Service: Cardiovascular;  Laterality: N/A;   CATARACT EXTRACTION Right    Dr. Octavia   COLONOSCOPY     COLONOSCOPY WITH PROPOFOL  N/A 03/30/2020   Procedure: COLONOSCOPY WITH PROPOFOL ;  Surgeon: Eartha Angelia Sieving, MD;  Location: [facility] ENDO SUITE;  Service: Gastroenterology;  Laterality: N/A;   coronary stents      CYSTOSCOPY WITH URETEROSCOPY Right 05/20/2013   Procedure: CYSTOSCOPY, RIGHT URETEROSCOPY STONE EXTRACTION, Insertion of right DOUBLE J STENT ;  Surgeon: Norleen Seltzer, MD;  Location: [facility] ORS;  Service: Urology;  Laterality: Right;   CYSTOSCOPY WITH URETEROSCOPY AND STENT PLACEMENT N/A 06/03/2013   Procedure: SECOND LOOK CYSTOSCOPY WITH URETEROSCOPY  HOLMIUM LASER LITHO AND STONE EXTRACTION [name] ;  Surgeon: Norleen JINNY Seltzer, MD;  Location: [facility] ORS;  Service: Urology;  Laterality: N/A;   ESOPHAGOGASTRODUODENOSCOPY (EGD) WITH PROPOFOL  N/A 11/20/2019   Procedure: ESOPHAGOGASTRODUODENOSCOPY (EGD) WITH PROPOFOL ;  Surgeon: Shaaron Lamar HERO, MD;  Location: [facility];  Service: Endoscopy;  Laterality: N/A;   GIVENS  CAPSULE STUDY N/A 10/30/2016   Procedure: GIVENS CAPSULE STUDY;  Surgeon: Abran Norleen SAILOR, MD;  Location: [facility] ENDOSCOPY;  Service: Endoscopy;  Laterality: N/A;  NEEDS TO BE ADMITTED FOR OBSERVATION PT. HAS DEFIB   HEMORROIDECTOMY  80's   HOLMIUM LASER APPLICATION Right 05/20/2013   Procedure: HOLMIUM LASER APPLICATION;  Surgeon: Norleen Seltzer, MD;  Location: [facility] ORS;  Service: Urology;  Laterality: Right;   HYSTEROSCOPY WITH D & C  09/02/2010   Procedure: DILATATION AND CURETTAGE (D&C) /HYSTEROSCOPY;  Surgeon: Charlie HERO Croak;  Location: [facility] ORS;  Service: Gynecology;  Laterality: N/A;  Dilation and Curettage with Hysteroscopy and Polypectomy   IMPLANTABLE CARDIOVERTER DEFIBRILLATOR (ICD) GENERATOR CHANGE N/A 02/10/2014   Procedure: ICD GENERATOR CHANGE;  Surgeon: Headland Francyne, MD;  Location: [facility] CATH LAB;  Service: Cardiovascular;  Laterality: N/A;   INTRAMEDULLARY (IM) NAIL INTERTROCHANTERIC Left 09/09/2022   Procedure: INTRAMEDULLARY (IM) NAIL INTERTROCHANTERIC;  Surgeon: Ernie Cough, MD;  Location: [facility] ORS;  Service: Orthopedics;  Laterality: Left;   LEFT HEART CATHETERIZATION WITH CORONARY ANGIOGRAM N/A 07/28/2011   Procedure: LEFT HEART CATHETERIZATION WITH CORONARY ANGIOGRAM;  Surgeon: Dorn JINNY Lesches, MD;  Location[facility] CATH LAB;  Service: Cardiovascular;  Laterality: N/A;   LEFT HEART CATHETERIZATION WITH CORONARY ANGIOGRAM N/A 12/31/2013   Procedure: LEFT HEART CATHETERIZATION WITH CORONARY ANGIOGRAM;  Surgeon: Deatrice DELENA Cage, MD;  Location: [facility] CATH LAB;  Service: Cardiovascular;  Laterality: N/A;     Home Medications:  Prior to Admission medications   Medication Sig Start Date End Date Taking? Authorizing Provider  acetaminophen  (TYLENOL ) 500 MG tablet Take 1 tablet (500 mg total) by mouth every 6 (six) hours as needed for mild pain, fever or headache. Patient taking differently: Take 325 mg by mouth at bedtime. 04/05/20  Yes Johnson, Clanford L, MD  Alpha-Lipoic Acid 600 MG TABS Take 600 mg  by mouth daily. For neuropathy in legs Patient taking differently: Take 100 mg by mouth daily. For neuropathy in legs 08/18/20  Yes Gottschalk, Ashly M, DO  amiodarone  (PACERONE ) 200 MG tablet Take 1 tablet (200 mg total) by mouth daily. 10/04/20  Yes Croitoru, Mihai, MD  amLODipine  (NORVASC ) 5 MG tablet Take 5 mg by mouth daily. Hold for bp less than 130/60   Yes [provider]  aspirin  EC 81 MG EC tablet Take 1 tablet (81 mg total) by mouth daily. Swallow whole. 02/09/21  Yes Leotis Bogus, MD  carboxymethylcellulose (REFRESH PLUS) 0.5 % SOLN Place 1 drop into both eyes 3 (three) times daily as needed (dry/irritated eyes.).   Yes [provider]  docusate sodium  (COLACE) 100 MG capsule Take 1 capsule (100 mg total) by mouth daily. Patient taking differently: Take 100-200 mg by mouth at bedtime. 11/30/22  Yes Mahon, Courtney L, NP  ELIQUIS  2.5 MG TABS tablet TAKE (1) TABLET TWICE A DAY. Patient taking differently: Take 2.5 mg by mouth 2 (two) times daily. 02/18/21  Yes Croitoru, Mihai, MD  gabapentin  (NEURONTIN ) 100 MG capsule Take 1 capsule (100 mg total) by mouth at bedtime. Patient taking differently: Take 300 mg by mouth at bedtime. 09/12/22  Yes Tobie Yetta HERO, MD  isosorbide  mononitrate (IMDUR ) 60 MG 24 hr tablet Take 1 tablet (60 mg total) by mouth daily. 01/31/21  Yes Croitoru, Mihai, MD  latanoprost  (XALATAN ) 0.005 % ophthalmic solution Place 1 drop into the left eye at bedtime. 07/12/21  Yes [provider]  levothyroxine  (SYNTHROID ) 125 MCG tablet Take 1 tablet (125 mcg total) by mouth daily. 06/12/23  Yes Croitoru, Mihai, MD  MELATONIN PO Take 10 mg by mouth at bedtime.   Yes [provider]  metoprolol  succinate (TOPROL -XL) 25 MG 24 hr tablet Take 0.5 tablets (12.5 mg total) by mouth daily. Take with or immediately following a meal. 01/31/21 07/11/23 Yes Croitoru, Mihai, MD  Multiple Vitamin (THEREMS) TABS Take 1 tablet by mouth daily.   Yes [provider]  Multiple Vitamins-Minerals (CENTRUM SILVER ULTRA WOMENS PO) Take 1 tablet by mouth every morning.   Yes [provider]  nitroGLYCERIN  (NITROSTAT ) 0.4 MG SL tablet Place 1 tablet (0.4 mg total) under the tongue every 5 (five) minutes as needed for chest pain. 06/12/23  Yes Croitoru, Mihai, MD  pantoprazole  (PROTONIX ) 40 MG tablet Take 1 tablet (40 mg total) by mouth 2 (two) times daily before a meal. 11/30/22  Yes Mahon, Charmaine CROME, NP  rosuvastatin  (CRESTOR ) 10 MG tablet TAKE 1 TABLET DAILY 05/17/21  Yes Gottschalk, Ashly M, DO  simethicone  (MYLICON) 125 MG chewable tablet Chew 125 mg by mouth every 6 (six) hours as needed for flatulence.   Yes [provider]  venlafaxine  (EFFEXOR ) 37.5 MG tablet Take 37.5 mg by mouth daily. 1500   Yes [provider]    Scheduled Meds:  [START ON 07/14/2023] amiodarone   200 mg Oral Daily   apixaban   2.5 mg Oral BID   aspirin  EC  81 mg Oral Daily   Chlorhexidine  Gluconate Cloth  6 each Topical Daily   feeding supplement  237 mL Oral BID BM   insulin  aspart  0-9 Units Subcutaneous TID WC   levothyroxine   125 mcg Oral Daily   metoprolol  succinate  12.5 mg Oral Daily   pantoprazole   40 mg Oral BID AC   rosuvastatin   10 mg Oral Daily   sodium chloride   1 g Oral TID WC   Continuous Infusions:  cefTRIAXone  (ROCEPHIN )  IV 2 g (07/12/23 2100)   sodium chloride  (hypertonic) 30 mL/hr at 07/13/23 0955   PRN Meds: acetaminophen  **OR** acetaminophen , prochlorperazine   Allergies:    Allergies  Allergen Reactions   Sotalol Other (See Comments)    Torsades    Tramadol  Other (See Comments)    Hallucination, bad-dream   Actos [Pioglitazone] Swelling   Adhesive [Tape] Other (  See Comments)    Causes sores   Atorvastatin  Other (See Comments)    myalgia   Bactrim  [Sulfamethoxazole -Trimethoprim ] Itching and Rash    Itchy rash   Ciprofloxacin  Nausea Only   Contrast Media [Iodinated Contrast Media] Other (See Comments)     Headache (no action/pre-med required)   Latex Rash   Pedi-Pre Tape Spray [Wound Dressing Adhesive] Other (See Comments)    Causes sores   Sulfa  Antibiotics Other (See Comments)    Causes sores    Social History:   Social History   Socioeconomic History   Marital status: Widowed    Spouse name: Not on file   Number of children: 4   Years of education: 8   Highest education level: 8th grade  Occupational History   Occupation: Retired  Tobacco Use   Smoking status: Never   Smokeless tobacco: Never  Vaping Use   Vaping status: Never Used  Substance and Sexual Activity   Alcohol  use: No   Drug use: No   Sexual activity: Never    Birth control/protection: Post-menopausal  Other Topics Concern   Not on file  Social History Narrative   Lives alone - son visits Sundays, daughter visits Saturday   Has an aide Elnora Larnell Pear) 5 days per week    Family History:    Family History  Problem Relation Age of Onset   Stroke Mother    Other Mother        brain tumor   Other Father        MI   Heart attack Father    Stroke Sister    Macular degeneration Sister    Diabetes Daughter    Cancer Daughter        ovarian   Colon polyps Daughter    Atrial fibrillation Sister    Hyperlipidemia Sister    Osteoporosis Sister    Stroke Sister    Uterine cancer Sister    Heart attack Brother    Heart disease Brother    Asthma Brother    Breast cancer Neg Hx      ROS:  Please see the history of present illness.   All other ROS reviewed and negative.     Physical Exam/Data: Vitals:   07/13/23 0700 07/13/23 0759 07/13/23 0800 07/13/23 0816  BP: (!) 134/44  (!) 127/41 (!) 127/41  Pulse: 62  (!) 59 63  Resp: 16  17   Temp:  98 F (36.7 C)    TempSrc:  Axillary    SpO2: 100%  100%   Weight:      Height:        Intake/Output Summary (Last 24 hours) at 07/13/2023 1027 Last data filed at 07/13/2023 0827 Gross per 24 hour  Intake 738.95 ml  Output 800 ml  Net -61.05 ml       07/13/2023    4:46 AM 07/12/2023   12:47 AM 07/11/2023    8:19 PM  Last 3 Weights  Weight (lbs) 141 lb 5 oz 140 lb 3.4 oz 108 lb 0.4 oz  Weight (kg) 64.1 kg 63.6 kg 49 kg     Body mass index is 25.03 kg/m.  General: Elderly female appearing in no acute distress.  HEENT: normal Neck: JVD at jawline.  Vascular: No carotid bruits; Distal pulses 2+ bilaterally Cardiac:  normal S1, S2; RRR; no murmur Lungs:  rales along bases bilaterally. No wheezing.  Abd: soft, nontender, no hepatomegaly  Ext: 1+ pitting edema bilaterally.  Musculoskeletal:  No deformities, BUE and BLE strength normal and equal Skin: warm and dry  Neuro:  CNs 2-12 intact, no focal abnormalities noted Psych:  Normal affect   EKG:  The EKG was personally reviewed and demonstrates: Sinus bradycardia, HR 58 with 1st degree AV Block and LBBB.  Telemetry:  Telemetry was personally reviewed and demonstrates:  NSR, HR in 60's.   Relevant CV Studies:  Echocardiogram: 07/12/2023 IMPRESSIONS     1. Septal dyskinesis; Hypokinesis worse in the inferior, distal anterior,  lateral and apical walls. Left ventricular ejection fraction, by  estimation, is 35%. The left ventricle has moderately decreased function.  Left ventricular diastolic parameters  are indeterminate.   2. Right ventricular systolic function is normal. The right ventricular  size is mildly enlarged.   3. Left atrial size was severely dilated.   4. Right atrial size was severely dilated.   5. Mild to moderate mitral valve regurgitation.   6. Tricuspid valve regurgitation is moderate.   7. The aortic valve is tricuspid. Aortic valve regurgitation is mild.  Aortic valve sclerosis/calcification is present, without any evidence of  aortic stenosis.   8. The inferior vena cava is normal in size with <50% respiratory  variability, suggesting right atrial pressure of 8 mmHg.   Comparison(s): The left ventricular function is worsened.   Laboratory  Data: High Sensitivity Troponin:   Recent Labs  Lab 07/11/23 2046 07/11/23 2247  TROPONINIHS 11 13     Chemistry Recent Labs  Lab 07/11/23 2046 07/12/23 0551 07/12/23 1153 07/12/23 2335 07/13/23 0346 07/13/23 0752  NA 119* 120*   < > 123* 124* 128*  K 4.4 3.8  --   --   --  3.9  CL 85* 89*  --   --   --  95*  CO2 21* 22  --   --   --  22  GLUCOSE 194* 146*  --   --   --  124*  BUN 17 16  --   --   --  14  CREATININE 1.13* 1.11*  --   --   --  0.99  CALCIUM  8.9 8.6*  --   --   --  8.2*  MG  --  1.7  --   --   --  1.9  GFRNONAA 47* 48*  --   --   --  55*  ANIONGAP 13 9  --   --   --  11   < > = values in this interval not displayed.    Recent Labs  Lab 07/11/23 2046 07/12/23 0551  PROT 6.5 6.1*  ALBUMIN 3.3* 3.1*  AST 103* 112*  ALT 80* 79*  ALKPHOS 80 74  BILITOT 0.6 0.6   Lipids No results for input(s): CHOL, TRIG, HDL, LABVLDL, LDLCALC, CHOLHDL in the last 168 hours.  Hematology Recent Labs  Lab 07/11/23 2046 07/12/23 0551 07/13/23 0752  WBC 6.3 6.6 13.7*  RBC 3.34* 3.08* 3.01*  HGB 8.8* 8.5* 8.3*  HCT 27.3* 25.0* 24.8*  MCV 81.7 81.2 82.4  MCH 26.3 27.6 27.6  MCHC 32.2 34.0 33.5  RDW 16.0* 16.0* 16.2*  PLT 244 246 254   Thyroid   Recent Labs  Lab 07/12/23 1027  TSH 36.741*    BNP Recent Labs  Lab 07/11/23 2046  BNP 2,362.0*    DDimer No results for input(s): DDIMER in the last 168 hours.  Radiology/Studies:  DG Chest Port 1 View Result Date: 07/11/2023 CLINICAL DATA:  Chest pain EXAM: PORTABLE  CHEST 1 VIEW COMPARISON:  Chest x-ray 12/09/2021 FINDINGS: Left-sided ICD present. The heart is enlarged the aorta is tortuous, unchanged. There central pulmonary vascular congestion and central interstitial prominence. There some mild patchy airspace opacities in the lower lungs and a small left pleural effusion. There is no pneumothorax or acute fracture. IMPRESSION: 1. Cardiomegaly with central pulmonary vascular congestion and  interstitial edema. 2. Mild patchy airspace opacities in the lower lungs and small left pleural effusion. Electronically Signed   By: Greig Pique M.D.   On: 07/11/2023 21:22     Assessment and Plan:  1. Acute HFrEF - Recent echocardiogram at [facility] earlier this month showed her EF was at 40 to 45% and repeat echocardiogram this admission estimates her EF at 35% with wall motion abnormalities noted and septal dyskinesis. As documented during prior office visit and in discussion with the patient again today, she reports she would not want to undergo any type of invasive procedures including a cardiac catheterization. Also previously declined CRT given her LBBB. Therefore, would focus on medical management. - BNP was elevated at 2362 on admission and CXR consistent with CHF. She was initially receiving IV Lasix  and received a total of 2 doses but this has now been discontinued. I&O's not fully recorded and weights unchanged at 140 - 141 lbs. Has been receiving hypertonic IV fluids at 30 mL/hr given hyponatremia. She still appears volume overload by examination today. Na+ was at 119 on admission and improved to 128 today. It appears that her weight has significantly increased as this was at 109 lbs in 05/2023 and now at 140 lbs. Will likely need to resume low-dose IV diuresis and follow sodium levels closely. In regards to GDMT, would ideally add an ARB or MRA but will likely need to hold off until sodium levels normalize. She did receive Amlodipine  5 mg today and will hold to allow for initiation of GDMT. Remains on Toprol -XL 12.5 mg daily.  Would not use an SGLT2 inhibitor given her variable food intake and concerns for failure to thrive.   2. CAD - She previously underwent stenting to LAD and LCx in 2003 with patent stents by cath in 2015. Reports occasional episodic chest pain at night but no symptoms with exertion. Hs troponin values have been negative this admission. As discussed above, she  declines invasive ischemic evaluation. Would focus on medical therapy. - She has been on ASA despite being on anticoagulation as well. Continue for now along with Toprol -XL 12.5 mg daily and Crestor  10 mg daily.  3. History of VT - She previously underwent ICD placement in 1993 with most recent gen change in 2016. Device is functioning normally by recent interrogation 05/2023.  4. Paroxysmal Atrial Fibrillation - Maintaining normal sinus rhythm this admission. She has been on Amiodarone  200 mg daily at home but it was held given her prolonged QT on admission. By review of her EKG, the computer-generated QTc was at 506 ms. When manually calculated given her LBBB, at 471 ms. Will obtain a repeat EKG.  Would restart Amiodarone  200mg  daily given her history of VT and AF. She has been on Toprol -XL 12.5 mg daily by her primary cardiologist despite her underlying conduction disease.  Given her cardiomyopathy, can try to continue on a very low-dose. - She remains on Eliquis  2.5 mg twice daily for anticoagulation but if her weight remains greater than 133 lbs, will need to titrate to 5 mg twice daily as her creatinine is stable at 0.99. However,  her weight was at 109 lbs during her office visit in 05/2023. Continue to follow with diuresis to make sure she is discharged on the appropriate dose.  5. LBBB - Noted on prior EKG tracings. By review of notes, she previously declined upgrade to CRT given her age.   6. Hyponatremia - Na+ was at 119 on 07/11/2023, improved to 128 today. Continue to follow.  7. Elevated TSH - TSH at 36.741 this admission. Will check Free T4. Suspect Synthroid  dosing will need to be adjusted as she has been on Synthroid  125 mcg daily.  Risk Assessment/Risk Scores:       New [place]  Heart Association (NYHA) Functional Class NYHA Class III  CHA2DS2-VASc Score = 7   This indicates a 11.2% annual risk of stroke. The patient's score is based upon: CHF History: 1 HTN History:  1 Diabetes History: 1 Stroke History: 0 Vascular Disease History: 1 Age Score: 2 Gender Score: 1    For questions or updates, please contact [facility] Please consult [email or web address] for contact info under    Signed, Laymon CHRISTELLA Qua, PA-C  07/13/2023 10:27 AM  Attending Note  Patient seen and discussed with PA Qua, I agree with her documentation. 89 yo female history of CPVT now with ICD, PAF, chronic LBBB, LAD with prior stents as outlined above, DM2, CKD 3, admitted with weakness, leg edema, SOB.  Recently admitted to [facility] from 6/14 to 6/18 due to hyponatremia and enterocolitis,    Admit labs Na 119 K 4.4 Cr 1.13 BUN 17 WBC 6.3 Hgb 8.8 Plt 244 BNP 2362 TSH 36 Trop 11-->13 EKG sinus brady with first degree AV block, LBBB CXR: +pulm edema  06/2023 echo: LVEF 35%, normal RV function, severe BAE, indet diastolic, mild to mod MR  - from notes medical therapy has been limted by bradycardia and orthostatic hypotension. - she has consistently declined invasive procedures   1.Acute HFrEF - 08/2022 echo: LVE 55-60% -06/2023 echo: LVEF 35%, normal RV function, severe BAE, indet diastolic, mild to mod MR - CXR + pulm edema, BNP 2362 - received IV lasix  40mg  yesterday, negative . Management complicated by hyponatremia, has been receiving hypertonic saline which is now discontinued - significantly volume overloaded, dose IV lasix  40mg  bid and follow labs. If Na drops below 125 would dose tolvaptan.  - has refused invasive procedures for many years, now with advanced age and comorbidities, poor functional capacity in general high risk and not a candidate for cath. Treat medically best we can which will also be limited by her prior issues with orthostatic hypotension and dizziness.  - continue toprol  for now, would not titrate due to prior bradycardia.   2.Hyponatremia - per primary team - presented volume overloaded, hypervolemic hyponatremia - transiently on  hypertonic saline now off. Follow Na with diuresis, if Na <125 consider tolvaptan dose.   3.Hypothyroidism - TSH 36, free T4 pending - per primary team  4. Afib - continue amiodrone, toprol , eliquis    Agree with palliative care consultation this admission. Keep in mind her ICD remains active if decisions are made regarding level of care.   Dorn Ross MD

## 2023-07-13 NOTE — Plan of Care (Signed)

## 2023-07-13 NOTE — Plan of Care (Signed)
  Problem: Acute Rehab OT Goals (only OT should resolve) Goal: Pt. Will Perform Grooming Flowsheets (Taken 07/13/2023 1410) Pt Will Perform Grooming: with modified independence Goal: Pt. Will Perform Lower Body Bathing Flowsheets (Taken 07/13/2023 1410) Pt Will Perform Lower Body Bathing: with contact guard assist Goal: Pt. Will Perform Lower Body Dressing Flowsheets (Taken 07/13/2023 1410) Pt Will Perform Lower Body Dressing: with contact guard assist Goal: Pt. Will Transfer To Toilet Flowsheets (Taken 07/13/2023 1410) Pt Will Transfer to Toilet: with contact guard assist Goal: Pt. Will Perform Toileting-Clothing Manipulation Flowsheets (Taken 07/13/2023 1410) Pt Will Perform Toileting - Clothing Manipulation and hygiene: with contact guard assist Goal: Pt/Caregiver Will Perform Home Exercise Program Flowsheets (Taken 07/13/2023 1410) Pt/caregiver will Perform Home Exercise Program:  Increased ROM  Increased strength  Left upper extremity  Right Upper extremity  Independently  Danyetta Gillham OT, MOT

## 2023-07-13 NOTE — Plan of Care (Signed)
   Problem: Education: Goal: Knowledge of General Education information will improve Description Including pain rating scale, medication(s)/side effects and non-pharmacologic comfort measures Outcome: Progressing   Problem: Health Behavior/Discharge Planning: Goal: Ability to manage health-related needs will improve Outcome: Progressing

## 2023-07-13 NOTE — Evaluation (Signed)
 Physical Therapy Evaluation Patient Details Name: [name] MRN: [id number] DOB: 14-Jul-1931 Today's Date: 07/13/2023  History of Present Illness  ARUNA NESTLER is a 89 y.o. female with medical history significant of chronic atrial fibrillation, CAD s/p stenting, CKD stage IIIA, frequent V. tach SP AICD placement who presents to the emergency department due to generalized weakness, shortness of breath, increased swelling in the legs and vomiting x 1 PTA.  She was recently admitted to [facility] from 6/14 to 6/18 due to hyponatremia and enterocolitis, diarrhea has since resolved, but she continued to complain of weakness.  She also complained of midsternal chest pressure PTA.  EMS was activated on arrival of EMS team, she was noted to have an O2 sat of 80% on room air.  Supplemental oxygen via [place] at 2 LPM was provided with improved oxygenation to 97%.   Clinical Impression  Patient demonstrates slow labored movement for sitting up at bedside, unsteady on feet and limited to a few steps forward/backwards before having to sit due to c/o fatigue, BLE weakness. Patient tolerated sitting up in chair after therapy - nurse aware. Patient will benefit from continued skilled physical therapy in hospital and recommended venue below to increase strength, balance, endurance for safe ADLs and gait.           If plan is discharge home, recommend the following: A lot of help with bathing/dressing/bathroom;A lot of help with walking and/or transfers;Help with stairs or ramp for entrance;Assistance with cooking/housework   Can travel by private vehicle   Yes    Equipment Recommendations None recommended by PT  Recommendations for Other Services       Functional Status Assessment Patient has had a recent decline in their functional status and demonstrates the ability to make significant improvements in function in a reasonable and predictable amount of time.     Precautions / Restrictions  Precautions Precautions: Fall Recall of Precautions/Restrictions: Intact Restrictions Weight Bearing Restrictions Per Provider Order: No      Mobility  Bed Mobility Overal bed mobility: Needs Assistance Bed Mobility: Supine to Sit     Supine to sit: Mod assist, Used rails, Max assist     General bed mobility comments: increased time, labored movement    Transfers Overall transfer level: Needs assistance Equipment used: Rolling walker (2 wheels) Transfers: Sit to/from Stand, Bed to chair/wheelchair/BSC Sit to Stand: Min assist   Step pivot transfers: Mod assist, Min assist       General transfer comment: slow labored movement due to BLE weakness    Ambulation/Gait Ambulation/Gait assistance: Mod assist Gait Distance (Feet): 8 Feet Assistive device: Rolling walker (2 wheels) Gait Pattern/deviations: Decreased step length - right, Decreased step length - left, Decreased stride length Gait velocity: slow     General Gait Details: limited to a few steps forward/backwards before having to sit due BLE weakness and c/o fatigue  Stairs            Wheelchair Mobility     Tilt Bed    Modified Rankin (Stroke Patients Only)       Balance Overall balance assessment: Needs assistance Sitting-balance support: Feet supported, No upper extremity supported Sitting balance-Leahy Scale: Fair Sitting balance - Comments: seated at EOB   Standing balance support: Bilateral upper extremity supported, Reliant on assistive device for balance Standing balance-Leahy Scale: Poor Standing balance comment: poor to fair with RW  Pertinent Vitals/Pain Pain Assessment Pain Assessment: Faces Faces Pain Scale: Hurts a little bit Pain Location: stomach Pain Descriptors / Indicators: Aching Pain Intervention(s): Limited activity within patient's tolerance, Monitored during session    Home Living Family/patient expects to be discharged  to:: Private residence Living Arrangements: Non-relatives/Friends Available Help at Discharge: Personal care attendant;Friend(s);Other (Comment) Type of Home: House Home Access: Stairs to enter Entrance Stairs-Rails: None Entrance Stairs-Number of Steps: 1   Home Layout: Laundry or work area in basement;Able to live on main level with bedroom/bathroom Home Equipment: Rollator (4 wheels);Cane - quad;Tub bench;BSC/3in1;Grab bars - tub/shower;Wheelchair - manual Additional Comments: Family lives across the street; Pt has a car provider that is there 8 hours a day and a friend that stays the night.    Prior Function Prior Level of Function : Needs assist       Physical Assist : ADLs (physical);Mobility (physical) Mobility (physical): Bed mobility;Transfers;Gait;Stairs ADLs (physical): IADLs Mobility Comments: household ambulation with rollator ADLs Comments: independent ADL mostly but some PRN assist. Assist IADL's.     Extremity/Trunk Assessment   Upper Extremity Assessment Upper Extremity Assessment: Defer to OT evaluation LUE Deficits / Details: mild lack of A/ROM for shoulder flexion    Lower Extremity Assessment Lower Extremity Assessment: Generalized weakness    Cervical / Trunk Assessment Cervical / Trunk Assessment: Kyphotic  Communication   Communication Communication: Impaired Factors Affecting Communication: Hearing impaired    Cognition Arousal: Alert Behavior During Therapy: WFL for tasks assessed/performed   PT - Cognitive impairments: No apparent impairments                         Following commands: Intact       Cueing Cueing Techniques: Verbal cues, Tactile cues     General Comments      Exercises     Assessment/Plan    PT Assessment Patient needs continued PT services  PT Problem List Decreased strength;Decreased activity tolerance;Decreased balance;Decreased mobility       PT Treatment Interventions DME instruction;Gait  training;Stair training;Functional mobility training;Therapeutic activities;Therapeutic exercise;Balance training;Patient/family education    PT Goals (Current goals can be found in the Care Plan section)  Acute Rehab PT Goals Patient Stated Goal: return home after rehab PT Goal Formulation: With patient Time For Goal Achievement: 07/27/23 Potential to Achieve Goals: Good    Frequency Min 3X/week     Co-evaluation PT/OT/SLP Co-Evaluation/Treatment: Yes Reason for Co-Treatment: To address functional/ADL transfers PT goals addressed during session: Mobility/safety with mobility;Balance;Proper use of DME OT goals addressed during session: ADL's and self-care       AM-PAC PT 6 Clicks Mobility  Outcome Measure Help needed turning from your back to your side while in a flat bed without using bedrails?: A Lot Help needed moving from lying on your back to sitting on the side of a flat bed without using bedrails?: A Lot Help needed moving to and from a bed to a chair (including a wheelchair)?: A Lot Help needed standing up from a chair using your arms (e.g., wheelchair or bedside chair)?: A Lot Help needed to walk in hospital room?: A Lot Help needed climbing 3-5 steps with a railing? : Total 6 Click Score: 11    End of Session   Activity Tolerance: Patient tolerated treatment well;Patient limited by fatigue Patient left: in chair;with call bell/phone within reach Nurse Communication: Mobility status PT Visit Diagnosis: Unsteadiness on feet (R26.81);Other abnormalities of gait and mobility (R26.89);Muscle weakness (generalized) (M62.81)  Time: 8673-8653 PT Time Calculation (min) (ACUTE ONLY): 20 min   Charges:   PT Evaluation $PT Eval Moderate Complexity: 1 Mod PT Treatments $Therapeutic Activity: 8-22 mins PT General Charges $$ ACUTE PT VISIT: 1 Visit         3:10 PM, 07/13/23 Lynwood Music, MPT Physical Therapist with [facility] [phone]  office [phone] mobile phone

## 2023-07-13 NOTE — Evaluation (Signed)
 Occupational Therapy Evaluation Patient Details Name: [name] MRN: [id number] DOB: [birth year] Today's Date: 07/13/2023   History of Present Illness   [name] is a 89 y.o. female with medical history significant of chronic atrial fibrillation, CAD s/p stenting, CKD stage IIIA, frequent V. tach SP AICD placement who presents to the emergency department due to generalized weakness, shortness of breath, increased swelling in the legs and vomiting x 1 PTA.  She was recently admitted to [facility] from 6/14 to 6/18 due to hyponatremia and enterocolitis, diarrhea has since resolved, but she continued to complain of weakness.  She also complained of midsternal chest pressure PTA.  EMS was activated on arrival of EMS team, she was noted to have an O2 sat of 80% on room air.  Supplemental oxygen via [place] at 2 LPM was provided with improved oxygenation to 97%. (per DO)     Clinical Impressions Pt agreeable to OT and PT co-evaluation. Pt reports PRN assist for ADL's at baseline but mostly independent. Max to total assist for lower body dressing today. Mod to max A for bed mobility and min to mod A for transfer to chair with RW and a few steps with the RW. B UE generally weak but pt able to complete upper body ADL's with set up assist. Pt left in the chair with call bell within reach. Pt will benefit from continued OT in the hospital and recommended venue below to increase strength, balance, and endurance for safe ADL's.        If plan is discharge home, recommend the following:   A little help with walking and/or transfers;A lot of help with bathing/dressing/bathroom;Assistance with cooking/housework;Assist for transportation;Help with stairs or ramp for entrance     Functional Status Assessment   Patient has had a recent decline in their functional status and demonstrates the ability to make significant improvements in function in a reasonable and predictable amount of time.     Equipment  Recommendations   None recommended by OT             Precautions/Restrictions   Precautions Precautions: Fall Recall of Precautions/Restrictions: Intact Restrictions Weight Bearing Restrictions Per Provider Order: No     Mobility Bed Mobility Overal bed mobility: Needs Assistance Bed Mobility: Supine to Sit     Supine to sit: Mod assist, Used rails, Max assist     General bed mobility comments: labored movement; assist to scoot to EOB; use of L rail.    Transfers Overall transfer level: Needs assistance Equipment used: Rolling walker (2 wheels) Transfers: Sit to/from Stand, Bed to chair/wheelchair/BSC Sit to Stand: Min assist     Step pivot transfers: Mod assist, Min assist     General transfer comment: EOB to chair with PT assisting; RW used. Labored movement; fall risk      Balance Overall balance assessment: Needs assistance Sitting-balance support: Bilateral upper extremity supported, Feet supported Sitting balance-Leahy Scale: Fair Sitting balance - Comments: seated at EOB   Standing balance support: Bilateral upper extremity supported, Reliant on assistive device for balance Standing balance-Leahy Scale: Poor Standing balance comment: poor to fair with RW                           ADL either performed or assessed with clinical judgement   ADL Overall ADL's : Needs assistance/impaired     Grooming: Set up;Sitting   Upper Body Bathing: Set up;Sitting   Lower Body Bathing: Total  assistance;Maximal assistance;Sitting/lateral leans   Upper Body Dressing : Set up;Sitting   Lower Body Dressing: Total assistance;Maximal assistance;Sitting/lateral leans;Bed level Lower Body Dressing Details (indicate cue type and reason): Pt was not able to don her own socks at bed level. Toilet Transfer: Minimal assistance;Moderate assistance;Stand-pivot;Rolling walker (2 wheels) Toilet Transfer Details (indicate cue type and reason): Simulated via EOB  to chair with RW Toileting- Clothing Manipulation and Hygiene: Maximal assistance;Total assistance;Bed level       Functional mobility during ADLs: Minimal assistance;Moderate assistance;Rolling walker (2 wheels) General ADL Comments: Able to take a few steps in the room with RW.     Vision Baseline Vision/History: 2 Legally blind (blind in L eye ; shots in L eye) Ability to See in Adequate Light: 3 Highly impaired Patient Visual Report: No change from baseline Vision Assessment?: No apparent visual deficits Additional Comments: other than baseline deficits     Perception Perception: Not tested       Praxis Praxis: Not tested       Pertinent Vitals/Pain Pain Assessment Pain Assessment: Faces Faces Pain Scale: Hurts a little bit Pain Location: stomach Pain Descriptors / Indicators:  (normal) Pain Intervention(s): Monitored during session     Extremity/Trunk Assessment Upper Extremity Assessment Upper Extremity Assessment: Generalized weakness;LUE deficits/detail LUE Deficits / Details: mild lack of A/ROM for shoulder flexion   Lower Extremity Assessment Lower Extremity Assessment: Defer to PT evaluation   Cervical / Trunk Assessment Cervical / Trunk Assessment: Kyphotic   Communication Communication Communication: Impaired Factors Affecting Communication: Hearing impaired   Cognition Arousal: Alert Behavior During Therapy: WFL for tasks assessed/performed Cognition: No apparent impairments                               Following commands: Intact       Cueing  General Comments   Cueing Techniques: Verbal cues;Tactile cues                 Home Living Family/patient expects to be discharged to:: Private residence Living Arrangements: Non-relatives/Friends Available Help at Discharge: Personal care attendant;Friend(s);Other (Comment) (just about 24/7 assist per the pt.) Type of Home: House Home Access: Stairs to enter [employer] of Steps: 1 Entrance Stairs-Rails: None Home Layout: Laundry or work area in basement;Able to live on main level with bedroom/bathroom     Bathroom Shower/Tub: Chief Strategy Officer: Handicapped height Bathroom Accessibility: Yes   Home Equipment: Rollator (4 wheels);Cane - quad;Tub bench;BSC/3in1;Grab bars - tub/shower;Wheelchair - manual   Additional Comments: Family lives across the street; Pt has a car provider that is there 8 hours a day and a friend that stays the night.      Prior Functioning/Environment Prior Level of Function : Needs assist       Physical Assist : ADLs (physical)   ADLs (physical): IADLs Mobility Comments: Ambulates with rollator ADLs Comments: independent ADL mostly but some PRN assist. Assist IADL's.    OT Problem List: Decreased strength;Decreased range of motion;Decreased activity tolerance;Impaired balance (sitting and/or standing)   OT Treatment/Interventions: Self-care/ADL training;Therapeutic exercise;DME and/or AE instruction;Therapeutic activities;Visual/perceptual remediation/compensation;Patient/family education;Energy conservation;Balance training      OT Goals(Current goals can be found in the care plan section)   Acute Rehab OT Goals Patient Stated Goal: improve function OT Goal Formulation: With patient Time For Goal Achievement: 07/27/23 Potential to Achieve Goals: Good   OT Frequency:  Min 2X/week    Co-evaluation PT/OT/SLP Co-Evaluation/Treatment:  Yes Reason for Co-Treatment: To address functional/ADL transfers   OT goals addressed during session: ADL's and self-care                       End of Session Equipment Utilized During Treatment: Rolling walker (2 wheels)  Activity Tolerance: Patient tolerated treatment well Patient left: in chair;with call bell/phone within reach  OT Visit Diagnosis: Unsteadiness on feet (R26.81);Other abnormalities of gait and mobility (R26.89);Muscle  weakness (generalized) (M62.81)                Time: 8667-8654 OT Time Calculation (min): 13 min Charges:  OT General Charges $OT Visit: 1 Visit OT Evaluation $OT Eval Low Complexity: 1 Low  Kodee Ravert OT, MOT   Jayson Person 07/13/2023, 2:08 PM

## 2023-07-13 NOTE — [place] FL2 (Deleted)
 [place]  MEDICAID FL2 LEVEL OF CARE FORM     IDENTIFICATION  Patient Name: [name] Birthdate: 09/01/1931 Sex: female Admission Date (Current Location): 07/11/2023  [facility] and [place] Number:  [employer] and Address:  [facility],  [address], [place] [address]      Provider Number: [id number]  Attending Physician Name and Address:  Maree Adron BIRCH, DO  Relative Name and Phone Number:       Current Level of Care: Hospital Recommended Level of Care: Skilled Nursing Facility Prior Approval Number:    Date Approved/Denied:   PASRR Number: [id number]  Discharge Plan: SNF    Current Diagnoses: Patient Active Problem List   Diagnosis Date Noted   Elevated brain natriuretic peptide (BNP) level 07/12/2023   Transaminitis 07/12/2023   Type 2 diabetes mellitus with hyperglycemia (HCC) 07/12/2023   Closed left hip fracture (HCC) 09/08/2022   Stroke-like symptoms 12/12/2021   Contusion of rib, right, initial encounter 12/09/2021   Fall at home, initial encounter 12/09/2021   Nuclear sclerotic cataract of left eye 08/09/2021   Hypertensive kidney disease with stage 3b chronic kidney disease (HCC) 04/04/2021   Weight loss 03/21/2021   Palliative care encounter 03/21/2021   TIA (transient ischemic attack) 02/03/2021   Cellulitis and abscess of left lower extremity 06/21/2020   Cellulitis of left leg 06/20/2020   Loss of weight 05/18/2020   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   Intractable abdominal pain 03/28/2020   Lactic acidosis 03/28/2020   Elevated troponin I level 03/28/2020   Malnutrition of moderate degree 12/26/2019   Prolonged QT interval 12/23/2019   Acute lower UTI 12/23/2019   Constipation 11/27/2019   Diarrhea 11/27/2019   Pancreatic lesion 11/26/2019   Fibromuscular dysplasia of right renal artery (HCC) 11/26/2019   Left renal artery stenosis (HCC) 11/26/2019   RUQ pain    Abnormal CT of the abdomen    Vertigo  11/15/2019   Advanced care planning/counseling discussion 11/15/2019   CAD (coronary artery disease)    Acquired hypothyroidism    Hyponatremia    Volume depletion    TSH elevation 08/01/2019   Hospital discharge follow-up 08/01/2019   Exudative age-related macular degeneration of right eye with active choroidal neovascularization (HCC) 04/30/2019   Retinal hemorrhage of right eye 04/30/2019   Advanced nonexudative age-related macular degeneration of left eye with subfoveal involvement 04/30/2019   Exudative age-related macular degeneration of left eye with inactive choroidal neovascularization (HCC) 04/30/2019   Advanced nonexudative age-related macular degeneration of right eye with subfoveal involvement 04/30/2019   Posterior vitreous detachment of both eyes 04/30/2019   Vitreous degeneration, bilateral 04/30/2019   Secondary hypercoagulable state (HCC) 03/11/2019   Persistent atrial fibrillation (HCC)    Proctitis 01/09/2019   Atrial fibrillation, chronic (HCC) 01/09/2019   Hyperlipidemia 01/09/2019   Nausea 01/09/2019   Hyperglycemia 01/09/2019   COVID-19 virus infection 12/28/2018   Chronic diarrhea 09/18/2018   Abdominal pain 09/18/2018   Osteoarthritis of right knee 03/29/2018   GERD (gastroesophageal reflux disease)    Dehydration 02/06/2018   Hypokalemia 02/06/2018   Diarrhea of presumed infectious origin 02/06/2018   Acute kidney injury superimposed on CKD (HCC) 02/05/2018   Essential hypertension 12/21/2017   Degenerative scoliosis 02/19/2017   Burst fracture of lumbar vertebra (HCC) 02/19/2017   History of drug-induced prolonged QT interval with torsade de pointes 11/01/2016   Iron  deficiency anemia 10/30/2016   Long term current use of anticoagulant 02/17/2016   Near syncope 05/22/2015   Diabetes  mellitus type 2, diet-controlled (HCC) 09/14/2014   ICD (implantable cardioverter-defibrillator) battery depletion 01/20/2014   Abnormal nuclear stress test, 12/30/13  12/31/2013   Coronary artery disease involving native coronary artery of native heart without angina pectoris 12/31/2013   Paroxysmal atrial fibrillation, chads2 Vasc2 score of 5, on eliquis  10/16/2013   Catecholaminergic polymorphic ventricular tachycardia (HCC) 10/16/2013   Ureteral stone with hydronephrosis 05/20/2013   Metabolic syndrome 05/07/2013   Orthostatic hypotension 07/27/2011   Chest pain 07/25/2011   Weakness 07/25/2011   Chronic sinus bradycardia 07/25/2011   ICD (implantable cardioverter-defibrillator) in place 07/25/2011   CKD (chronic kidney disease), stage III (HCC) 07/25/2011   History of DVT in the past, on eliquis  now 07/25/2011   Nephrolithiasis, just saw Dr Watt- OK 07/25/2011   DYSPHAGIA 03/24/2010   Chronic ischemic heart disease 12/03/2007   Irritable bowel syndrome with diarrhea 12/03/2007   EPIGASTRIC PAIN 12/03/2007   Hyperlipidemia associated with type 2 diabetes mellitus (HCC) 12/02/2007   EXTERNAL HEMORRHOIDS 12/02/2007   ESOPHAGITIS 12/02/2007   GASTRITIS 12/02/2007   DIVERTICULOSIS, COLON 12/02/2007   ARTHRITIS 12/02/2007    Orientation RESPIRATION BLADDER Height & Weight        Normal External catheter Weight: 141 lb 5 oz (64.1 kg) Height:  5' 3 (160 cm)  BEHAVIORAL SYMPTOMS/MOOD NEUROLOGICAL BOWEL NUTRITION STATUS      Incontinent Diet (See d/c summary)  AMBULATORY STATUS COMMUNICATION OF NEEDS Skin   Extensive Assist Verbally Surgical wounds, Bruising                       Personal Care Assistance Level of Assistance  Bathing, Feeding, Dressing Bathing Assistance: Maximum assistance Feeding assistance: Limited assistance Dressing Assistance: Maximum assistance     Functional Limitations Info  Sight, Hearing, Speech Sight Info: Adequate Hearing Info: Adequate Speech Info: Adequate    SPECIAL CARE FACTORS FREQUENCY  PT (By licensed PT), OT (By licensed OT)     PT Frequency: 5x weekly OT Frequency: 5x weekly             Contractures      Additional Factors Info  Code Status, Allergies, Psychotropic Code Status Info: DNR- Limited Allergies Info: Sotalol  Tramadol   Actos (Pioglitazone)  Adhesive (Tape)  Atorvastatin   Bactrim  (Sulfamethoxazole -trimethoprim )  Ciprofloxacin   Contrast Media (Iodinated Contrast Media)  Latex  Pedi-pre Tape Spray (Wound Dressing Adhesive)  Sulfa  Antibiotics Psychotropic Info: Effexor          Current Medications (07/13/2023):  This is the current hospital active medication list Current Facility-Administered Medications  Medication Dose Route Frequency Provider Last Rate Last Admin   acetaminophen  (TYLENOL ) tablet 650 mg  650 mg Oral Q6H PRN Adefeso, Oladapo, DO   650 mg at 07/12/23 9490   Or   acetaminophen  (TYLENOL ) suppository 650 mg  650 mg Rectal Q6H PRN Adefeso, Oladapo, DO       [START ON 07/14/2023] amiodarone  (PACERONE ) tablet 200 mg  200 mg Oral Daily Strader, Brittany M, PA-C       apixaban  (ELIQUIS ) tablet 2.5 mg  2.5 mg Oral BID Adefeso, Oladapo, DO   2.5 mg at 07/13/23 0817   aspirin  EC tablet 81 mg  81 mg Oral Daily Adefeso, Oladapo, DO   81 mg at 07/13/23 0817   cefTRIAXone  (ROCEPHIN ) 2 g in sodium chloride  0.9 % 100 mL IVPB  2 g Intravenous Q24H Maree, Pratik D, DO 200 mL/hr at 07/12/23 2100 2 g at 07/12/23 2100   Chlorhexidine  Gluconate Cloth 2 %  PADS 6 each  6 each Topical Daily Adefeso, Oladapo, DO   6 each at 07/13/23 0955   feeding supplement (ENSURE PLUS HIGH PROTEIN) liquid 237 mL  237 mL Oral BID BM Adefeso, Oladapo, DO   237 mL at 07/13/23 1239   furosemide  (LASIX ) injection 40 mg  40 mg Intravenous BID Alvan Dorn FALCON, MD       furosemide  (LASIX ) injection 40 mg  40 mg Intravenous Once Alvan Dorn FALCON, MD       insulin  aspart (novoLOG ) injection 0-9 Units  0-9 Units Subcutaneous TID WC Adefeso, Oladapo, DO   1 Units at 07/13/23 1238   levothyroxine  (SYNTHROID ) tablet 125 mcg  125 mcg Oral Daily Adefeso, Oladapo, DO   125 mcg at 07/13/23 9357    metoprolol  succinate (TOPROL -XL) 24 hr tablet 12.5 mg  12.5 mg Oral Daily Adefeso, Oladapo, DO   12.5 mg at 07/13/23 0816   pantoprazole  (PROTONIX ) EC tablet 40 mg  40 mg Oral BID AC Adefeso, Oladapo, DO   40 mg at 07/13/23 0817   prochlorperazine  (COMPAZINE ) injection 10 mg  10 mg Intravenous Q6H PRN Adefeso, Oladapo, DO   10 mg at 07/12/23 1708   rosuvastatin  (CRESTOR ) tablet 10 mg  10 mg Oral Daily Adefeso, Oladapo, DO   10 mg at 07/13/23 9182   sodium chloride  tablet 1 g  1 g Oral TID WC Shah, Pratik D, DO   1 g at 07/13/23 1237     Discharge Medications: Please see discharge summary for a list of discharge medications.  Relevant Imaging Results:  Relevant Lab Results:   Additional Information SSN: [id number]. [facility].  [place] Saddie Kim, LCSW

## 2023-07-13 NOTE — Progress Notes (Signed)
 PROGRESS NOTE    [name]  FMW:[id number] DOB: 1931/04/14 DOA: 07/11/2023 PCP: Cleotilde Laymon HERO, NP   Brief Narrative:    [name] is a 89 y.o. female with medical history significant of chronic atrial fibrillation, CAD s/p stenting, CKD stage IIIA, frequent V. tach SP AICD placement who presents to the emergency department due to generalized weakness, shortness of breath, increased swelling in the legs and vomiting x 1 PTA.  She was recently admitted to [facility] from 6/14 to 6/18 due to hyponatremia and enterocolitis, diarrhea has since resolved, but she continued to complain of weakness.  Patient was admitted with symptomatic hyponatremia as well as acute on chronic diastolic CHF exacerbation and has been started on IV diuresis.  However she has had poor oral intake as well as some diarrhea and therefore IV Lasix  was discontinued.  Significant hyponatremia persisted and she has been started on hypertonic saline with slow correction noted.  She is also noted to have UTI and started on Rocephin .  Noted to have acute HFrEF on 6/27 after 2D echocardiogram performed 6/26 demonstrating LVEF 35%.  Seen by cardiology with recommendations for IV Lasix  at this time.  She is not a candidate for catheterization.  Assessment & Plan:   Principal Problem:   Hyponatremia Active Problems:   Hyperlipidemia associated with type 2 diabetes mellitus (HCC)   Persistent atrial fibrillation (HCC)   CAD (coronary artery disease)   Acquired hypothyroidism   Prolonged QT interval   Elevated brain natriuretic peptide (BNP) level   Transaminitis   Type 2 diabetes mellitus with hyperglycemia (HCC)  Assessment and Plan:  Hypervolemic hyponatremia-improved Na up to 127 with hypertonic saline Discontinue further use of hypertonic saline and patient appears hypervolemic and use Lasix  for now Consider use of tolvaptan if sodium drops below 125 This may be dilutional considering slight increase in sodium  after Lasix  TSH elevated at 36 and free T4 pending   Acute HFrEF New echocardiogram with LVEF 35% with septal dyskinesis and hypokinesis to multiple areas, appreciate cardiology recommendations BNP 2,362 Continue total input/output, daily weights and fluid restriction Continue IV Lasix  40 mg twice daily per cardiology Continue heart healthy/carb modified diet        UTI Rocephin  initiated Urine cultures pending   Transaminitis AST 103, ALT 80 This may be due to congestive hepatopathy Continue IV Lasix  40 mg twice daily Continue to follow in a.m.   Type 2 diabetes mellitus with hyperglycemia Hemoglobin A1c on 05/24/2023 was 6.5 Continue ISS and hypoglycemia protocol   Persistent atrial fibrillation Continue Toprol , Eliquis , and amiodarone    CAD Continue aspirin , Toprol  XL per home regimen   Acquired hypothyroidism Continue Synthroid  TSH elevated, free T4 pending   Mixed hyperlipidemia Continue Crestor   DVT prophylaxis:apixaban  Code Status: DNR Family Communication: None at bedside Disposition Plan:  Status is: Inpatient Remains inpatient appropriate because: Need for IV medications.   Consultants:  Palliative care Discussed with nephrology Cardiology  Procedures:  None  Antimicrobials:  Anti-infectives (From admission, onward)    Start     Dose/Rate Route Frequency Ordered Stop   07/12/23 2000  cefTRIAXone  (ROCEPHIN ) 2 g in sodium chloride  0.9 % 100 mL IVPB        2 g 200 mL/hr over 30 Minutes Intravenous Every 24 hours 07/12/23 1821         Subjective: Patient seen and evaluated today with no new acute complaints or concerns. No acute concerns or events noted overnight.  Patient appears fatigued and  is overall poorly responsive.  Objective: Vitals:   07/13/23 0900 07/13/23 1000 07/13/23 1100 07/13/23 1121  BP: (!) 136/42 (!) 133/42 (!) 131/49   Pulse:  63    Resp: 13 14 18    Temp:    98.1 F (36.7 C)  TempSrc:    Axillary  SpO2: 100% 92%     Weight:      Height:        Intake/Output Summary (Last 24 hours) at 07/13/2023 1458 Last data filed at 07/13/2023 1152 Gross per 24 hour  Intake 1067.17 ml  Output 1100 ml  Net -32.83 ml   Filed Weights   07/11/23 2019 07/12/23 0047 07/13/23 0446  Weight: 49 kg 63.6 kg 64.1 kg    Examination:  General exam: Appears calm and comfortable, fatigued Respiratory system: Clear to auscultation. Respiratory effort normal. Cardiovascular system: S1 & S2 heard, RRR.  Gastrointestinal system: Abdomen is soft Central nervous system: Alert and awake Extremities: No edema Skin: No significant lesions noted Psychiatry: Flat affect.    Data Reviewed: I have personally reviewed following labs and imaging studies  CBC: Recent Labs  Lab 07/11/23 2046 07/12/23 0551 07/13/23 0752  WBC 6.3 6.6 13.7*  HGB 8.8* 8.5* 8.3*  HCT 27.3* 25.0* 24.8*  MCV 81.7 81.2 82.4  PLT 244 246 254   Basic Metabolic Panel: Recent Labs  Lab 07/11/23 2046 07/12/23 0551 07/12/23 1153 07/12/23 1939 07/12/23 2335 07/13/23 0346 07/13/23 0752 07/13/23 1052  NA 119* 120*   < > 122* 123* 124* 128* 127*  K 4.4 3.8  --   --   --   --  3.9  --   CL 85* 89*  --   --   --   --  95*  --   CO2 21* 22  --   --   --   --  22  --   GLUCOSE 194* 146*  --   --   --   --  124*  --   BUN 17 16  --   --   --   --  14  --   CREATININE 1.13* 1.11*  --   --   --   --  0.99  --   CALCIUM  8.9 8.6*  --   --   --   --  8.2*  --   MG  --  1.7  --   --   --   --  1.9  --   PHOS  --  3.7  --   --   --   --   --   --    < > = values in this interval not displayed.   GFR: Estimated Creatinine Clearance: 34.7 mL/min (by C-G formula based on SCr of 0.99 mg/dL). Liver Function Tests: Recent Labs  Lab 07/11/23 2046 07/12/23 0551  AST 103* 112*  ALT 80* 79*  ALKPHOS 80 74  BILITOT 0.6 0.6  PROT 6.5 6.1*  ALBUMIN 3.3* 3.1*   No results for input(s): LIPASE, AMYLASE in the last 168 hours. No results for input(s):  AMMONIA in the last 168 hours. Coagulation Profile: No results for input(s): INR, PROTIME in the last 168 hours. Cardiac Enzymes: No results for input(s): CKTOTAL, CKMB, CKMBINDEX, TROPONINI in the last 168 hours. BNP (last 3 results) No results for input(s): PROBNP in the last 8760 hours. HbA1C: No results for input(s): HGBA1C in the last 72 hours. CBG: Recent Labs  Lab 07/12/23 1208 07/12/23 1628 07/12/23  2135 07/13/23 0750 07/13/23 1122  GLUCAP 162* 153* 123* 101* 134*   Lipid Profile: No results for input(s): CHOL, HDL, LDLCALC, TRIG, CHOLHDL, LDLDIRECT in the last 72 hours. Thyroid  Function Tests: Recent Labs    07/12/23 1027  TSH 36.741*   Anemia Panel: No results for input(s): VITAMINB12, FOLATE, FERRITIN, TIBC, IRON , RETICCTPCT in the last 72 hours. Sepsis Labs: No results for input(s): PROCALCITON, LATICACIDVEN in the last 168 hours.  Recent Results (from the past 240 hours)  MRSA Next Gen by PCR, Nasal     Status: None   Collection Time: 07/11/23 11:50 PM   Specimen: Nasal Mucosa; Nasal Swab  Result Value Ref Range Status   MRSA by PCR Next Gen NOT DETECTED NOT DETECTED Final    Comment: (NOTE) The GeneXpert MRSA Assay (FDA approved for NASAL specimens only), is one component of a comprehensive MRSA colonization surveillance program. It is not intended to diagnose MRSA infection nor to guide or monitor treatment for MRSA infections. Test performance is not FDA approved in patients less than 45 years old. Performed at [facility], [address], [place], [place] [address]   Urine Culture     Status: None (Preliminary result)   Collection Time: 07/12/23  7:21 AM   Specimen: Urine, Clean Catch  Result Value Ref Range Status   Specimen Description   Final    URINE, CLEAN CATCH Performed at [facility], [address] [place], [place] [address]    Special Requests   Final    NONE Reflexed from  [id number] Performed at [facility], [address], [place], [place] [address]    Culture PENDING  Incomplete   Report Status PENDING  Incomplete         Radiology Studies: ECHOCARDIOGRAM COMPLETE Result Date: 07/12/2023    ECHOCARDIOGRAM REPORT   Patient Name:   [name] Date of Exam: 07/12/2023 Medical Rec #:  [id number]        Height:       63.0 in Accession #:    [id number]       Weight:       140.2 lb Date of Birth:  1931/09/11        BSA:          1.663 m Patient Age:    89 years         BP:           138/66 mmHg Patient Gender: F                HR:           58 bpm. Exam Location:  [facility] Procedure: 2D Echo, Cardiac Doppler and Color Doppler (Both Spectral and Color            Flow Doppler were utilized during procedure). Indications:    CHF, Acute diastolic  History:        Patient has prior history of Echocardiogram examinations. CHF;                 Risk Factors:Hypertension.  Sonographer:    Vella Key Referring Phys: [id number] OLADAPO ADEFESO IMPRESSIONS  1. Septal dyskinesis; Hypokinesis worse in the inferior, distal anterior, lateral and apical walls. Left ventricular ejection fraction, by estimation, is 35%. The left ventricle has moderately decreased function. Left ventricular diastolic parameters are indeterminate.  2. Right ventricular systolic function is normal. The right ventricular size is mildly enlarged.  3. Left atrial size was severely dilated.  4. Right atrial size was severely dilated.  5. Mild to moderate mitral valve regurgitation.  6. Tricuspid valve regurgitation is moderate.  7. The aortic valve is tricuspid. Aortic valve regurgitation is mild. Aortic valve sclerosis/calcification is present, without any evidence of aortic stenosis.  8. The inferior vena cava is normal in size with <50% respiratory variability, suggesting right atrial pressure of 8 mmHg. Comparison(s): The left ventricular function is worsened. FINDINGS  Left Ventricle: Septal dyskinesis; Hypokinesis  worse in the inferior, distal anterior, lateral and apical walls. Left ventricular ejection fraction, by estimation, is 35%. The left ventricle has moderately decreased function. The left ventricular internal cavity size was normal in size. There is no left ventricular hypertrophy. Left ventricular diastolic parameters are indeterminate. Right Ventricle: The right ventricular size is mildly enlarged. Right vetricular wall thickness was not assessed. Right ventricular systolic function is normal. Left Atrium: Left atrial size was severely dilated. Right Atrium: Right atrial size was severely dilated. Pericardium: There is no evidence of pericardial effusion. Mitral Valve: There is mild thickening of the mitral valve leaflet(s). Mild mitral annular calcification. Mild to moderate mitral valve regurgitation. Tricuspid Valve: The tricuspid valve is normal in structure. Tricuspid valve regurgitation is moderate. Aortic Valve: The aortic valve is tricuspid. Aortic valve regurgitation is mild. Aortic valve sclerosis/calcification is present, without any evidence of aortic stenosis. Pulmonic Valve: The pulmonic valve was normal in structure. Pulmonic valve regurgitation is mild. Aorta: The aortic root and ascending aorta are structurally normal, with no evidence of dilitation. Venous: The inferior vena cava is normal in size with less than 50% respiratory variability, suggesting right atrial pressure of 8 mmHg. IAS/Shunts: No atrial level shunt detected by color flow Doppler. Additional Comments: A device lead is visualized.  LEFT VENTRICLE PLAX 2D LVIDd:         4.70 cm LVIDs:         3.70 cm LV PW:         1.10 cm LV IVS:        1.10 cm LVOT diam:     1.90 cm LV SV:         48 LV SV Index:   29 LVOT Area:     2.84 cm  LV Volumes (MOD) LV vol d, MOD A2C: 125.5 ml LV vol d, MOD A4C: 105.5 ml LV vol s, MOD A2C: 73.3 ml LV vol s, MOD A4C: 57.6 ml LV SV MOD A2C:     52.2 ml LV SV MOD A4C:     105.5 ml LV SV MOD BP:      47.7  ml RIGHT VENTRICLE RV Basal diam:  4.70 cm RV S prime:     8.67 cm/s TAPSE (M-mode): 2.5 cm LEFT ATRIUM              Index        RIGHT ATRIUM           Index LA diam:        4.30 cm  2.59 cm/m   RA Area:     26.50 cm LA Vol (A2C):   132.0 ml 79.39 ml/m  RA Volume:   107.00 ml 64.35 ml/m LA Vol (A4C):   119.0 ml 71.57 ml/m LA Biplane Vol: 135.0 ml 81.19 ml/m  AORTIC VALVE LVOT Vmax:   80.00 cm/s LVOT Vmean:  56.600 cm/s LVOT VTI:    0.168 m  AORTA Ao Root diam: 3.40 cm Ao Asc diam:  3.40 cm MITRAL VALVE  TRICUSPID VALVE MV Area (PHT): 4.12 cm    TV Peak grad:   28.1 mmHg MV Decel Time: 184 msec    TV Vmax:        2.65 m/s MV E velocity: 72.00 cm/s  TR Peak grad:   28.1 mmHg MV A velocity: 54.40 cm/s  TR Vmax:        265.00 cm/s MV E/A ratio:  1.32                            SHUNTS                            Systemic VTI:  0.17 m                            Systemic Diam: 1.90 cm Vina Gull MD Electronically signed by Vina Gull MD Signature Date/Time: 07/12/2023/4:31:31 PM    Final    DG Chest Port 1 View Result Date: 07/11/2023 CLINICAL DATA:  Chest pain EXAM: PORTABLE CHEST 1 VIEW COMPARISON:  Chest x-ray 12/09/2021 FINDINGS: Left-sided ICD present. The heart is enlarged the aorta is tortuous, unchanged. There central pulmonary vascular congestion and central interstitial prominence. There some mild patchy airspace opacities in the lower lungs and a small left pleural effusion. There is no pneumothorax or acute fracture. IMPRESSION: 1. Cardiomegaly with central pulmonary vascular congestion and interstitial edema. 2. Mild patchy airspace opacities in the lower lungs and small left pleural effusion. Electronically Signed   By: Greig Pique M.D.   On: 07/11/2023 21:22        Scheduled Meds:  [START ON 07/14/2023] amiodarone   200 mg Oral Daily   apixaban   2.5 mg Oral BID   aspirin  EC  81 mg Oral Daily   Chlorhexidine  Gluconate Cloth  6 each Topical Daily   feeding supplement  237 mL  Oral BID BM   furosemide   40 mg Intravenous BID   furosemide   40 mg Intravenous Once   insulin  aspart  0-9 Units Subcutaneous TID WC   levothyroxine   125 mcg Oral Daily   metoprolol  succinate  12.5 mg Oral Daily   pantoprazole   40 mg Oral BID AC   rosuvastatin   10 mg Oral Daily   sodium chloride   1 g Oral TID WC     LOS: 2 days    Time spent: 55 minutes    Clarabelle Oscarson JONETTA Fairly, DO [facility]  If 7PM-7AM, please contact night-coverage [email or web address] 07/13/2023, 2:58 PM

## 2023-07-13 NOTE — [place] FL2 (Signed)
 [place]  MEDICAID FL2 LEVEL OF CARE FORM     IDENTIFICATION  Patient Name: [name] Birthdate: 04/21/1931 Sex: female Admission Date (Current Location): 07/11/2023  [facility] and [place] Number:  [employer] and Address:  [facility],  [address], [place] [address]      Provider Number: [id number]  Attending Physician Name and Address:  Maree Adron BIRCH, DO  Relative Name and Phone Number:       Current Level of Care: Hospital Recommended Level of Care: Skilled Nursing Facility Prior Approval Number:    Date Approved/Denied:   PASRR Number: [id number]  Discharge Plan: SNF    Current Diagnoses: Patient Active Problem List   Diagnosis Date Noted   Elevated brain natriuretic peptide (BNP) level 07/12/2023   Transaminitis 07/12/2023   Type 2 diabetes mellitus with hyperglycemia (HCC) 07/12/2023   Closed left hip fracture (HCC) 09/08/2022   Stroke-like symptoms 12/12/2021   Contusion of rib, right, initial encounter 12/09/2021   Fall at home, initial encounter 12/09/2021   Nuclear sclerotic cataract of left eye 08/09/2021   Hypertensive kidney disease with stage 3b chronic kidney disease (HCC) 04/04/2021   Weight loss 03/21/2021   Palliative care encounter 03/21/2021   TIA (transient ischemic attack) 02/03/2021   Cellulitis and abscess of left lower extremity 06/21/2020   Cellulitis of left leg 06/20/2020   Loss of weight 05/18/2020   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   Intractable abdominal pain 03/28/2020   Lactic acidosis 03/28/2020   Elevated troponin I level 03/28/2020   Malnutrition of moderate degree 12/26/2019   Prolonged QT interval 12/23/2019   Acute lower UTI 12/23/2019   Constipation 11/27/2019   Diarrhea 11/27/2019   Pancreatic lesion 11/26/2019   Fibromuscular dysplasia of right renal artery (HCC) 11/26/2019   Left renal artery stenosis (HCC) 11/26/2019   RUQ pain    Abnormal CT of the abdomen    Vertigo  11/15/2019   Advanced care planning/counseling discussion 11/15/2019   CAD (coronary artery disease)    Acquired hypothyroidism    Hyponatremia    Volume depletion    TSH elevation 08/01/2019   Hospital discharge follow-up 08/01/2019   Exudative age-related macular degeneration of right eye with active choroidal neovascularization (HCC) 04/30/2019   Retinal hemorrhage of right eye 04/30/2019   Advanced nonexudative age-related macular degeneration of left eye with subfoveal involvement 04/30/2019   Exudative age-related macular degeneration of left eye with inactive choroidal neovascularization (HCC) 04/30/2019   Advanced nonexudative age-related macular degeneration of right eye with subfoveal involvement 04/30/2019   Posterior vitreous detachment of both eyes 04/30/2019   Vitreous degeneration, bilateral 04/30/2019   Secondary hypercoagulable state (HCC) 03/11/2019   Persistent atrial fibrillation (HCC)    Proctitis 01/09/2019   Atrial fibrillation, chronic (HCC) 01/09/2019   Hyperlipidemia 01/09/2019   Nausea 01/09/2019   Hyperglycemia 01/09/2019   COVID-19 virus infection 12/28/2018   Chronic diarrhea 09/18/2018   Abdominal pain 09/18/2018   Osteoarthritis of right knee 03/29/2018   GERD (gastroesophageal reflux disease)    Dehydration 02/06/2018   Hypokalemia 02/06/2018   Diarrhea of presumed infectious origin 02/06/2018   Acute kidney injury superimposed on CKD (HCC) 02/05/2018   Essential hypertension 12/21/2017   Degenerative scoliosis 02/19/2017   Burst fracture of lumbar vertebra (HCC) 02/19/2017   History of drug-induced prolonged QT interval with torsade de pointes 11/01/2016   Iron  deficiency anemia 10/30/2016   Long term current use of anticoagulant 02/17/2016   Near syncope 05/22/2015   Diabetes  mellitus type 2, diet-controlled (HCC) 09/14/2014   ICD (implantable cardioverter-defibrillator) battery depletion 01/20/2014   Abnormal nuclear stress test, 12/30/13  12/31/2013   Coronary artery disease involving native coronary artery of native heart without angina pectoris 12/31/2013   Paroxysmal atrial fibrillation, chads2 Vasc2 score of 5, on eliquis  10/16/2013   Catecholaminergic polymorphic ventricular tachycardia (HCC) 10/16/2013   Ureteral stone with hydronephrosis 05/20/2013   Metabolic syndrome 05/07/2013   Orthostatic hypotension 07/27/2011   Chest pain 07/25/2011   Weakness 07/25/2011   Chronic sinus bradycardia 07/25/2011   ICD (implantable cardioverter-defibrillator) in place 07/25/2011   CKD (chronic kidney disease), stage III (HCC) 07/25/2011   History of DVT in the past, on eliquis  now 07/25/2011   Nephrolithiasis, just saw Dr Watt- OK 07/25/2011   DYSPHAGIA 03/24/2010   Chronic ischemic heart disease 12/03/2007   Irritable bowel syndrome with diarrhea 12/03/2007   EPIGASTRIC PAIN 12/03/2007   Hyperlipidemia associated with type 2 diabetes mellitus (HCC) 12/02/2007   EXTERNAL HEMORRHOIDS 12/02/2007   ESOPHAGITIS 12/02/2007   GASTRITIS 12/02/2007   DIVERTICULOSIS, COLON 12/02/2007   ARTHRITIS 12/02/2007    Orientation RESPIRATION BLADDER Height & Weight     Self, Place  Normal External catheter Weight: 141 lb 5 oz (64.1 kg) Height:  5' 3 (160 cm)  BEHAVIORAL SYMPTOMS/MOOD NEUROLOGICAL BOWEL NUTRITION STATUS      Incontinent Diet (See d/c summary)  AMBULATORY STATUS COMMUNICATION OF NEEDS Skin   Extensive Assist Verbally Surgical wounds, Bruising                       Personal Care Assistance Level of Assistance  Bathing, Feeding, Dressing Bathing Assistance: Maximum assistance Feeding assistance: Limited assistance Dressing Assistance: Maximum assistance     Functional Limitations Info  Sight, Hearing, Speech Sight Info: Adequate Hearing Info: Adequate Speech Info: Adequate    SPECIAL CARE FACTORS FREQUENCY  PT (By licensed PT), OT (By licensed OT)     PT Frequency: 5x weekly OT Frequency: 5x weekly             Contractures      Additional Factors Info  Code Status, Allergies, Psychotropic Code Status Info: DNR- Limited Allergies Info: Sotalol  Tramadol   Actos (Pioglitazone)  Adhesive (Tape)  Atorvastatin   Bactrim  (Sulfamethoxazole -trimethoprim )  Ciprofloxacin   Contrast Media (Iodinated Contrast Media)  Latex  Pedi-pre Tape Spray (Wound Dressing Adhesive)  Sulfa  Antibiotics Psychotropic Info: Effexor          Current Medications (07/13/2023):  This is the current hospital active medication list Current Facility-Administered Medications  Medication Dose Route Frequency Provider Last Rate Last Admin   acetaminophen  (TYLENOL ) tablet 650 mg  650 mg Oral Q6H PRN Adefeso, Oladapo, DO   650 mg at 07/12/23 9490   Or   acetaminophen  (TYLENOL ) suppository 650 mg  650 mg Rectal Q6H PRN Adefeso, Oladapo, DO       [START ON 07/14/2023] amiodarone  (PACERONE ) tablet 200 mg  200 mg Oral Daily Strader, Brittany M, PA-C       apixaban  (ELIQUIS ) tablet 2.5 mg  2.5 mg Oral BID Adefeso, Oladapo, DO   2.5 mg at 07/13/23 0817   aspirin  EC tablet 81 mg  81 mg Oral Daily Adefeso, Oladapo, DO   81 mg at 07/13/23 0817   cefTRIAXone  (ROCEPHIN ) 2 g in sodium chloride  0.9 % 100 mL IVPB  2 g Intravenous Q24H Maree, Pratik D, DO 200 mL/hr at 07/12/23 2100 2 g at 07/12/23 2100   Chlorhexidine  Gluconate Cloth 2 %  PADS 6 each  6 each Topical Daily Adefeso, Oladapo, DO   6 each at 07/13/23 0955   feeding supplement (ENSURE PLUS HIGH PROTEIN) liquid 237 mL  237 mL Oral BID BM Adefeso, Oladapo, DO   237 mL at 07/13/23 1239   furosemide  (LASIX ) injection 40 mg  40 mg Intravenous BID Alvan Dorn FALCON, MD       furosemide  (LASIX ) injection 40 mg  40 mg Intravenous Once Alvan Dorn FALCON, MD       insulin  aspart (novoLOG ) injection 0-9 Units  0-9 Units Subcutaneous TID WC Adefeso, Oladapo, DO   1 Units at 07/13/23 1238   levothyroxine  (SYNTHROID ) tablet 125 mcg  125 mcg Oral Daily Adefeso, Oladapo, DO   125 mcg at 07/13/23  9357   metoprolol  succinate (TOPROL -XL) 24 hr tablet 12.5 mg  12.5 mg Oral Daily Adefeso, Oladapo, DO   12.5 mg at 07/13/23 0816   pantoprazole  (PROTONIX ) EC tablet 40 mg  40 mg Oral BID AC Adefeso, Oladapo, DO   40 mg at 07/13/23 0817   prochlorperazine  (COMPAZINE ) injection 10 mg  10 mg Intravenous Q6H PRN Adefeso, Oladapo, DO   10 mg at 07/12/23 1708   rosuvastatin  (CRESTOR ) tablet 10 mg  10 mg Oral Daily Adefeso, Oladapo, DO   10 mg at 07/13/23 9182   sodium chloride  tablet 1 g  1 g Oral TID WC Shah, Pratik D, DO   1 g at 07/13/23 1237     Discharge Medications: Please see discharge summary for a list of discharge medications.  Relevant Imaging Results:  Relevant Lab Results:   Additional Information SSN: [id number]. [facility].  [place] Saddie Kim, LCSW

## 2023-07-13 NOTE — Plan of Care (Signed)
  Problem: Acute Rehab PT Goals(only PT should resolve) Goal: Pt Will Go Supine/Side To Sit Outcome: Progressing Flowsheets (Taken 07/13/2023 1511) Pt will go Supine/Side to Sit:  with contact guard assist  with minimal assist Goal: Patient Will Transfer Sit To/From Stand Outcome: Progressing Flowsheets (Taken 07/13/2023 1511) Patient will transfer sit to/from stand:  with minimal assist  with contact guard assist Goal: Pt Will Transfer Bed To Chair/Chair To Bed Outcome: Progressing Flowsheets (Taken 07/13/2023 1511) Pt will Transfer Bed to Chair/Chair to Bed:  with contact guard assist  with min assist Goal: Pt Will Ambulate Outcome: Progressing Flowsheets (Taken 07/13/2023 1511) Pt will Ambulate:  25 feet  with minimal assist  with rolling walker   3:12 PM, 07/13/23 Lynwood Music, MPT Physical Therapist with [facility] [phone] office [phone] mobile phone

## 2023-07-13 NOTE — Progress Notes (Signed)
 Heart Failure Nurse Navigator Progress Note  PCP: Cleotilde Laymon HERO, NP PCP-Cardiologist: Jerel Balding, MD Admission Diagnosis: Hyponatremia Acute congestive heart failure, unspecified heart failure type [facility]) Admitted from: Home via [facility]  *[facility] patient called remotely for Heart Failure Education in room IC10. Patient 2 identifiers used prior to conversation.  Patient had difficulty hearing on the phone so she initially hung up.  RN assisted patient with phone volume and called me back.  Patient still only able to hear bits of Heart Failure education on the phone due to her Hearing Impairment. Heart Failure Education will need to be reviewed at CHF Clinic appointment that was scheduled.  Presentation:   [name] presented with weakness, decreased appetite, increased leg swelling, emesis x 1, and chest pain. EMS placed patient of 2L because O2 88% on RA.  Patient has been several days admitted to [facility] ER last week where she was treated for enteritis and diarrhea symptoms have resolved but continues to feel weak since admission.  She complained of mid sternal chest pressure that started just prior to arrival during transport.  Hx: CHF, MI, Atrial Fibrillation on Eliquis , Hypertension, GERD, Chronic kidney disease and Type 2 Diabetes. BNP 2,362. Chest X-Ray-Cardiomegaly with central pulmonary vascular congestion and interstitial edema.  Mild patchy airspace opacities in the lower lungs and small left pleural effusion.  ECHO/ LVEF: 35%-07/12/23. Septal dyskinesis, Hypokinesis worse in the inferior, distal anterior, lateral and apical walls.  Left Ventricle has moderately decreased function.  The right ventricular size is mildly enlarged.  Left atrial size was severely dilated.  Right atrial size was severely dilated.  Mild to moderate mitral valve regurgitation.  Tricuspid valve regurgitation is moderate.  Aortic valve regurgitation is mild. Aortic valve sclerosis/ calcification is  present with no stenosis.   EF 55-60%-09/09/22   Clinical Course:  Past Medical History:  Diagnosis Date   AICD (automatic cardioverter/defibrillator) present    Allergy    SESONAL   Aneurysm of infrarenal abdominal aorta (HCC)    Anxiety    ARTHRITIS    Arthritis    Atrial fibrillation (HCC)    CAD (coronary artery disease)    a. history of cardiac arrest 1993;  b. s/p LAD/LCX stenting in 2003;  c. 07/2011 Cath: LM nl, LAD patent stent, D1 80ost, LCX patent stent, RCA min irregs;  d. 04/2012 MV: EF 66%, no ishcemia;  e. 12/2013 Echo: Ef 55%, no rwma, Gr 1 DD, triv AI/MR, mildly dil LA;  f. 12/2013 Lexi MV: intermediate risk - apical ischemia and inf/infsept fixed defect, ? artifactual.   CAD in native artery 12/31/2013   Previous stents to LAD and Ramus patent on cath today 12/31/13 also There is severe disease in the ostial first diagonal which is unchanged from most recent cardiac catheterization. The right coronary artery could not be engaged selectively but nonselective angiography showed no significant disease in the proximal and midsegment.       Cataract    DENIES   Chronic back pain    Chronic sinus bradycardia    CKD (chronic kidney disease), stage III (HCC)    Clotting disorder (HCC)    DVT   COLITIS 12/02/2007   Qualifier: Diagnosis of  By: Kowalk CMA LEODIS), Chick     Diabetes mellitus without complication (HCC)    DENIES   Diverticulosis of colon (without mention of hemorrhage) 2007   Colonoscopy    Esophageal stricture    a. 2012 s/p dil.   Esophagitis,  unspecified    a. 2012 EGD   EXTERNAL HEMORRHOIDS    GERD (gastroesophageal reflux disease)    omeprazole   H/O hiatal hernia    Hiatal hernia    a. 2012 EGD.   History of DVT in the past, not on Coumadin now    left  leg   HYPERCHOLESTEROLEMIA    Hypertension    ICD (implantable cardiac defibrillator) in place    a. s/p initial ICD in 1993 in setting of cardiac arrest;  b. 01/2007 gen change: Guidant T135  Vitality DS VR single lead ICD.   Macular degeneration    gets injecton in eye every 5 weeks- last injection - 05/03/2013    Myocardial infarction [facility]) 1993   Near syncope 05/22/2015   Nephrolithiasis, just saw Dr Watt- OK    Orthostatic hypotension    Peripheral vascular disease (HCC)    ???   RECTAL BLEEDING 12/03/2007   Qualifier: Diagnosis of  By: Jakie MD NOLIA Lenis R    Unspecified gastritis and gastroduodenitis without mention of hemorrhage    a. 2003 EGD->not noted on 2012 EGD.   Unstable angina, neg MI, cath stable.maybe GI 12/30/2013     Social History   Socioeconomic History   Marital status: Widowed    Spouse name: Not on file   Number of children: 4   Years of education: 8   Highest education level: 8th grade  Occupational History   Occupation: Retired  Tobacco Use   Smoking status: Never   Smokeless tobacco: Never  Vaping Use   Vaping status: Never Used  Substance and Sexual Activity   Alcohol  use: No   Drug use: No   Sexual activity: Never    Birth control/protection: Post-menopausal  Other Topics Concern   Not on file  Social History Narrative   Lives alone - son visits Sundays, daughter visits Saturday   Has an aide (Beryl Larnell Pear) 5 days per week   Social Drivers of Health   Financial Resource Strain: Medium Risk (07/02/2023)   Received [facility]   Overall Financial Resource Strain (CARDIA)    How hard is it for you to pay for the very basics like food, housing, medical care, and heating?: Somewhat hard  Food Insecurity: No Food Insecurity (07/12/2023)   Hunger Vital Sign    Worried About Running Out of Food in the Last Year: Never true    Ran Out of Food in the Last Year: Never true  Transportation Needs: No Transportation Needs (07/12/2023)   PRAPARE - Administrator, Civil Service (Medical): No    Lack of Transportation (Non-Medical): No  Physical Activity: Inactive (03/15/2021)   Exercise Vital Sign    Days  of Exercise per Week: 0 days    Minutes of Exercise per Session: 0 min  Stress: Stress Concern Present (03/15/2021)   [employer] of Occupational Health - Occupational Stress Questionnaire    Feeling of Stress : To some extent  Social Connections: Moderately Isolated (09/27/2020)   Social Connection and Isolation Panel    Frequency of Communication with Friends and Family: More than three times a week    Frequency of Social Gatherings with Friends and Family: Twice a week    Attends Religious Services: 1 to 4 times per year    Active Member of [employer] or Organizations: No    Attends Banker Meetings: Never    Marital Status: Widowed   Education Assessment and Provision:  Detailed education and instructions provided on heart failure disease management including the following:  Signs and symptoms of Heart Failure When to call the physician Importance of daily weights Low sodium diet Fluid restriction Medication management Anticipated future follow-up appointments  Patient education given on each of the above topics.  Patient acknowledges understanding via teach back method and acceptance of all instructions.  Education Materials:  Living Better With Heart Failure Booklet, HF zone tool, & Daily Weight Tracker Tool.  Patient has scale at home: Yes Patient has pill box at home: Not sure    High Risk Criteria for Readmission and/or Poor Patient Outcomes: Heart failure hospital admissions (last 6 months): 1  No Show rate: 4% Difficult social situation: Hearing Impaired Demonstrates medication adherence: Yes Primary Language: English Literacy level: Reading, Writing & Comprehension  Barriers of Care:   Hearing Impaired   Considerations/Referrals:   Referral made to Heart Failure Pharmacist Stewardship: Yes Referral made to Heart Failure CSW/NCM TOC: No Referral made to Heart & Vascular TOC clinic: Yes.  [facility] 07/23/23 @ 11:45.  Items  for Follow-up on DC/TOC: Daily Weights Diet & Fluid Restrictions Review of Heart Failure Education in person again at HF Clinic appointment because patient could not hear me on the phone.  Charmaine Pines, RN, BSN [facility] Heart Failure Navigator Secure Chat Only

## 2023-07-14 DIAGNOSIS — E871 Hypo-osmolality and hyponatremia: Secondary | ICD-10-CM | POA: Diagnosis not present

## 2023-07-14 LAB — COMPREHENSIVE METABOLIC PANEL WITH GFR
ALT: 108 U/L — ABNORMAL HIGH (ref 0–44)
AST: 161 U/L — ABNORMAL HIGH (ref 15–41)
Albumin: 2.7 g/dL — ABNORMAL LOW (ref 3.5–5.0)
Alkaline Phosphatase: 71 U/L (ref 38–126)
Anion gap: 10 (ref 5–15)
BUN: 16 mg/dL (ref 8–23)
CO2: 24 mmol/L (ref 22–32)
Calcium: 8 mg/dL — ABNORMAL LOW (ref 8.9–10.3)
Chloride: 97 mmol/L — ABNORMAL LOW (ref 98–111)
Creatinine, Ser: 1.02 mg/dL — ABNORMAL HIGH (ref 0.44–1.00)
GFR, Estimated: 53 mL/min — ABNORMAL LOW (ref >60)
Glucose, Bld: 132 mg/dL — ABNORMAL HIGH (ref 70–99)
Potassium: 3.2 mmol/L — ABNORMAL LOW (ref 3.5–5.1)
Sodium: 131 mmol/L — ABNORMAL LOW (ref 135–145)
Total Bilirubin: 0.7 mg/dL (ref 0.0–1.2)
Total Protein: 5.7 g/dL — ABNORMAL LOW (ref 6.5–8.1)

## 2023-07-14 LAB — CBC
HCT: 25.2 % — ABNORMAL LOW (ref 36.0–46.0)
Hemoglobin: 8.1 g/dL — ABNORMAL LOW (ref 12.0–15.0)
MCH: 26 pg (ref 26.0–34.0)
MCHC: 32.1 g/dL (ref 30.0–36.0)
MCV: 80.8 fL (ref 80.0–100.0)
Platelets: 268 10*3/uL (ref 150–400)
RBC: 3.12 MIL/uL — ABNORMAL LOW (ref 3.87–5.11)
RDW: 16 % — ABNORMAL HIGH (ref 11.5–15.5)
WBC: 9.6 10*3/uL (ref 4.0–10.5)
nRBC: 0.5 % — ABNORMAL HIGH (ref 0.0–0.2)

## 2023-07-14 LAB — GLUCOSE, CAPILLARY
Glucose-Capillary: 106 mg/dL — ABNORMAL HIGH (ref 70–99)
Glucose-Capillary: 185 mg/dL — ABNORMAL HIGH (ref 70–99)
Glucose-Capillary: 107 mg/dL — ABNORMAL HIGH (ref 70–99)
Glucose-Capillary: 146 mg/dL — ABNORMAL HIGH (ref 70–99)

## 2023-07-14 LAB — MAGNESIUM: Magnesium: 1.8 mg/dL (ref 1.7–2.4)

## 2023-07-14 LAB — T4, FREE: Free T4: 1.25 ng/dL — ABNORMAL HIGH (ref 0.61–1.12)

## 2023-07-14 MED ORDER — LEVOTHYROXINE SODIUM 75 MCG PO TABS
150.0000 ug | ORAL_TABLET | Freq: Every day | ORAL | Status: DC
  Administered 2023-07-15 – 2023-07-17 (×3): 150 ug via ORAL
  Filled 2023-07-14 (×3): qty 2

## 2023-07-14 MED ORDER — POTASSIUM CHLORIDE CRYS ER 20 MEQ PO TBCR
40.0000 meq | EXTENDED_RELEASE_TABLET | Freq: Two times a day (BID) | ORAL | Status: AC
  Administered 2023-07-14 (×2): 40 meq via ORAL
  Filled 2023-07-14 (×2): qty 2

## 2023-07-14 NOTE — Progress Notes (Signed)
 PROGRESS NOTE    [name]  FMW:[id number] DOB: June 30, 1931 DOA: 07/11/2023 PCP: Cleotilde Laymon HERO, NP   Brief Narrative:    [name] is a 89 y.o. female with medical history significant of chronic atrial fibrillation, CAD s/p stenting, CKD stage IIIA, frequent V. tach SP AICD placement who presents to the emergency department due to generalized weakness, shortness of breath, increased swelling in the legs and vomiting x 1 PTA.  She was recently admitted to [facility] from 6/14 to 6/18 due to hyponatremia and enterocolitis, diarrhea has since resolved, but she continued to complain of weakness.  Patient was admitted with symptomatic hyponatremia as well as acute on chronic diastolic CHF exacerbation and has been started on IV diuresis.  However she has had poor oral intake as well as some diarrhea and therefore IV Lasix  was discontinued.  Significant hyponatremia persisted and she has been started on hypertonic saline with slow correction noted.  She is also noted to have UTI and started on Rocephin .  Noted to have acute HFrEF on 6/27 after 2D echocardiogram performed 6/26 demonstrating LVEF 35%.  Seen by cardiology with recommendations for IV Lasix  at this time.  She is not a candidate for catheterization.  Assessment & Plan:   Principal Problem:   Hyponatremia Active Problems:   Hyperlipidemia associated with type 2 diabetes mellitus (HCC)   Persistent atrial fibrillation (HCC)   CAD (coronary artery disease)   Acquired hypothyroidism   Prolonged QT interval   Elevated brain natriuretic peptide (BNP) level   Transaminitis   Type 2 diabetes mellitus with hyperglycemia (HCC)  Assessment and Plan:  Hypervolemic hyponatremia-improved Na up to 131, continue to monitor Continue diuresis with IV Lasix  Adjust levothyroxine  as below   Acute HFrEF New echocardiogram with LVEF 35% with septal dyskinesis and hypokinesis to multiple areas, appreciate cardiology recommendations BNP  2,362 Continue total input/output, daily weights and fluid restriction Continue IV Lasix  40 mg twice daily per cardiology Continue heart healthy/carb modified diet        UTI Rocephin  initiated Urine cultures pending, day 3/3   Transaminitis Continues to remain elevated, monitor closely This may be due to congestive hepatopathy Continue IV Lasix  40 mg twice daily Continue to follow in a.m.   Type 2 diabetes mellitus with hyperglycemia Hemoglobin A1c on 05/24/2023 was 6.5 Continue ISS and hypoglycemia protocol   Persistent atrial fibrillation Continue Toprol , Eliquis , and amiodarone    CAD Continue aspirin , Toprol  XL per home regimen   Acquired hypothyroidism Continue Synthroid  TSH elevated, free T4 elevated at 1.25 Goal TSH should be 6-7, plan to increase Synthroid  dose slowly   Mixed hyperlipidemia Continue Crestor   DVT prophylaxis:apixaban  Code Status: DNR Family Communication: None at bedside Disposition Plan:  Status is: Inpatient Remains inpatient appropriate because: Need for IV medications.   Consultants:  Palliative care Discussed with nephrology Cardiology  Procedures:  None  Antimicrobials:  Anti-infectives (From admission, onward)    Start     Dose/Rate Route Frequency Ordered Stop   07/12/23 2000  cefTRIAXone  (ROCEPHIN ) 2 g in sodium chloride  0.9 % 100 mL IVPB        2 g 200 mL/hr over 30 Minutes Intravenous Every 24 hours 07/12/23 1821         Subjective: Patient seen and evaluated today with no new acute complaints or concerns. No acute concerns or events noted overnight.  Patient is up and having breakfast.  Objective: Vitals:   07/14/23 0100 07/14/23 0300 07/14/23 0452 07/14/23 0800  BP: ROLLEN)  146/50 (!) 154/54 137/65 (!) 151/69  Pulse: 69 70 71 70  Resp: 16 15 16    Temp:  98.6 F (37 C) 98.2 F (36.8 C)   TempSrc:  Oral Oral   SpO2: 99% 93% 100%   Weight:   59.9 kg   Height:        Intake/Output Summary (Last 24 hours) at  07/14/2023 1251 Last data filed at 07/14/2023 1129 Gross per 24 hour  Intake 120 ml  Output 1350 ml  Net -1230 ml   Filed Weights   07/12/23 0047 07/13/23 0446 07/14/23 0452  Weight: 63.6 kg 64.1 kg 59.9 kg    Examination:  General exam: Appears calm and comfortable, fatigued Respiratory system: Clear to auscultation. Respiratory effort normal. Cardiovascular system: S1 & S2 heard, RRR.  Gastrointestinal system: Abdomen is soft Central nervous system: Alert and awake Extremities: No edema Skin: No significant lesions noted Psychiatry: Flat affect.    Data Reviewed: I have personally reviewed following labs and imaging studies  CBC: Recent Labs  Lab 07/11/23 2046 07/12/23 0551 07/13/23 0752 07/14/23 0239  WBC 6.3 6.6 13.7* 9.6  HGB 8.8* 8.5* 8.3* 8.1*  HCT 27.3* 25.0* 24.8* 25.2*  MCV 81.7 81.2 82.4 80.8  PLT 244 246 254 268   Basic Metabolic Panel: Recent Labs  Lab 07/11/23 2046 07/12/23 0551 07/12/23 1153 07/12/23 2335 07/13/23 0346 07/13/23 0752 07/13/23 1052 07/14/23 0239  NA 119* 120*   < > 123* 124* 128* 127* 131*  K 4.4 3.8  --   --   --  3.9  --  3.2*  CL 85* 89*  --   --   --  95*  --  97*  CO2 21* 22  --   --   --  22  --  24  GLUCOSE 194* 146*  --   --   --  124*  --  132*  BUN 17 16  --   --   --  14  --  16  CREATININE 1.13* 1.11*  --   --   --  0.99  --  1.02*  CALCIUM  8.9 8.6*  --   --   --  8.2*  --  8.0*  MG  --  1.7  --   --   --  1.9  --  1.8  PHOS  --  3.7  --   --   --   --   --   --    < > = values in this interval not displayed.   GFR: Estimated Creatinine Clearance: 30.9 mL/min (A) (by C-G formula based on SCr of 1.02 mg/dL (H)). Liver Function Tests: Recent Labs  Lab 07/11/23 2046 07/12/23 0551 07/14/23 0239  AST 103* 112* 161*  ALT 80* 79* 108*  ALKPHOS 80 74 71  BILITOT 0.6 0.6 0.7  PROT 6.5 6.1* 5.7*  ALBUMIN 3.3* 3.1* 2.7*   No results for input(s): LIPASE, AMYLASE in the last 168 hours. No results for  input(s): AMMONIA in the last 168 hours. Coagulation Profile: No results for input(s): INR, PROTIME in the last 168 hours. Cardiac Enzymes: No results for input(s): CKTOTAL, CKMB, CKMBINDEX, TROPONINI in the last 168 hours. BNP (last 3 results) No results for input(s): PROBNP in the last 8760 hours. HbA1C: No results for input(s): HGBA1C in the last 72 hours. CBG: Recent Labs  Lab 07/13/23 1122 07/13/23 1624 07/13/23 1944 07/14/23 0742 07/14/23 1127  GLUCAP 134* 167* 166* 106* 185*   Lipid  Profile: No results for input(s): CHOL, HDL, LDLCALC, TRIG, CHOLHDL, LDLDIRECT in the last 72 hours. Thyroid  Function Tests: Recent Labs    07/12/23 1027 07/14/23 0239  TSH 36.741*  --   FREET4  --  1.25*   Anemia Panel: No results for input(s): VITAMINB12, FOLATE, FERRITIN, TIBC, IRON , RETICCTPCT in the last 72 hours. Sepsis Labs: No results for input(s): PROCALCITON, LATICACIDVEN in the last 168 hours.  Recent Results (from the past 240 hours)  MRSA Next Gen by PCR, Nasal     Status: None   Collection Time: 07/11/23 11:50 PM   Specimen: Nasal Mucosa; Nasal Swab  Result Value Ref Range Status   MRSA by PCR Next Gen NOT DETECTED NOT DETECTED Final    Comment: (NOTE) The GeneXpert MRSA Assay (FDA approved for NASAL specimens only), is one component of a comprehensive MRSA colonization surveillance program. It is not intended to diagnose MRSA infection nor to guide or monitor treatment for MRSA infections. Test performance is not FDA approved in patients less than 90 years old. Performed at [facility], [address], [place], [place] [address]   Urine Culture     Status: None (Preliminary result)   Collection Time: 07/12/23  7:21 AM   Specimen: Urine, Clean Catch  Result Value Ref Range Status   Specimen Description   Final    URINE, CLEAN CATCH Performed at [facility], [address], [place], [place] [address]    Special  Requests   Final    NONE Reflexed from [id number] Performed at [facility], [address], [place], [place] [address]    Culture   Final    CULTURE REINCUBATED FOR BETTER GROWTH Performed at [facility], [address], [place], [place] [address]    Report Status PENDING  Incomplete         Radiology Studies: No results found.       Scheduled Meds:  amiodarone   200 mg Oral Daily   apixaban   2.5 mg Oral BID   aspirin  EC  81 mg Oral Daily   Chlorhexidine  Gluconate Cloth  6 each Topical Daily   feeding supplement  237 mL Oral BID BM   furosemide   40 mg Intravenous BID   insulin  aspart  0-9 Units Subcutaneous TID WC   latanoprost   1 drop Left Eye QHS   levothyroxine   125 mcg Oral Daily   metoprolol  succinate  12.5 mg Oral Daily   pantoprazole   40 mg Oral BID AC   potassium chloride   40 mEq Oral BID   rosuvastatin   10 mg Oral Daily   sodium chloride   1 g Oral TID WC     LOS: 3 days    Time spent: 55 minutes    Jaman Aro JONETTA Fairly, DO [facility]  If 7PM-7AM, please contact night-coverage [email or web address] 07/14/2023, 12:51 PM

## 2023-07-14 NOTE — Plan of Care (Signed)

## 2023-07-14 NOTE — Progress Notes (Signed)
 This RN gave report to RN receiving pt on 300

## 2023-07-15 ENCOUNTER — Inpatient Hospital Stay (HOSPITAL_COMMUNITY): Payer: Medicare (Managed Care)

## 2023-07-15 DIAGNOSIS — E871 Hypo-osmolality and hyponatremia: Secondary | ICD-10-CM | POA: Diagnosis not present

## 2023-07-15 LAB — GLUCOSE, CAPILLARY
Glucose-Capillary: 146 mg/dL — ABNORMAL HIGH (ref 70–99)
Glucose-Capillary: 178 mg/dL — ABNORMAL HIGH (ref 70–99)
Glucose-Capillary: 193 mg/dL — ABNORMAL HIGH (ref 70–99)
Glucose-Capillary: 117 mg/dL — ABNORMAL HIGH (ref 70–99)

## 2023-07-15 LAB — CBC
HCT: 25.6 % — ABNORMAL LOW (ref 36.0–46.0)
Hemoglobin: 8.3 g/dL — ABNORMAL LOW (ref 12.0–15.0)
MCH: 26.4 pg (ref 26.0–34.0)
MCHC: 32.4 g/dL (ref 30.0–36.0)
MCV: 81.5 fL (ref 80.0–100.0)
Platelets: 279 10*3/uL (ref 150–400)
RBC: 3.14 MIL/uL — ABNORMAL LOW (ref 3.87–5.11)
RDW: 16.3 % — ABNORMAL HIGH (ref 11.5–15.5)
WBC: 7.2 10*3/uL (ref 4.0–10.5)
nRBC: 1 % — ABNORMAL HIGH (ref 0.0–0.2)

## 2023-07-15 LAB — COMPREHENSIVE METABOLIC PANEL WITH GFR
ALT: 202 U/L — ABNORMAL HIGH (ref 0–44)
AST: 326 U/L — ABNORMAL HIGH (ref 15–41)
Albumin: 2.8 g/dL — ABNORMAL LOW (ref 3.5–5.0)
Alkaline Phosphatase: 75 U/L (ref 38–126)
Anion gap: 10 (ref 5–15)
BUN: 18 mg/dL (ref 8–23)
CO2: 24 mmol/L (ref 22–32)
Calcium: 8.1 mg/dL — ABNORMAL LOW (ref 8.9–10.3)
Chloride: 95 mmol/L — ABNORMAL LOW (ref 98–111)
Creatinine, Ser: 0.93 mg/dL (ref 0.44–1.00)
GFR, Estimated: 59 mL/min — ABNORMAL LOW (ref >60)
Glucose, Bld: 131 mg/dL — ABNORMAL HIGH (ref 70–99)
Potassium: 3.9 mmol/L (ref 3.5–5.1)
Sodium: 129 mmol/L — ABNORMAL LOW (ref 135–145)
Total Bilirubin: 0.7 mg/dL (ref 0.0–1.2)
Total Protein: 5.8 g/dL — ABNORMAL LOW (ref 6.5–8.1)

## 2023-07-15 LAB — HEPATITIS PANEL, ACUTE
HCV Ab: NONREACTIVE
Hep A IgM: NONREACTIVE
Hep B C IgM: NONREACTIVE
Hepatitis B Surface Ag: NONREACTIVE

## 2023-07-15 LAB — MAGNESIUM: Magnesium: 1.7 mg/dL (ref 1.7–2.4)

## 2023-07-15 LAB — PROTIME-INR
INR: 1.6 — ABNORMAL HIGH (ref 0.8–1.2)
Prothrombin Time: 20.1 s — ABNORMAL HIGH (ref 11.4–15.2)

## 2023-07-15 NOTE — Progress Notes (Signed)
 PROGRESS NOTE    [name]  FMW:[id number] DOB: August 15, 1931 DOA: 07/11/2023 PCP: Cleotilde Laymon HERO, NP   Brief Narrative:    [name] is a 89 y.o. female with medical history significant of chronic atrial fibrillation, CAD s/p stenting, CKD stage IIIA, frequent V. tach SP AICD placement who presents to the emergency department due to generalized weakness, shortness of breath, increased swelling in the legs and vomiting x 1 PTA.  She was recently admitted to [facility] from 6/14 to 6/18 due to hyponatremia and enterocolitis, diarrhea has since resolved, but she continued to complain of weakness.  Patient was admitted with symptomatic hyponatremia as well as acute on chronic diastolic CHF exacerbation and has been started on IV diuresis.  However she has had poor oral intake as well as some diarrhea and therefore IV Lasix  was discontinued.  Significant hyponatremia persisted and she has been started on hypertonic saline with slow correction noted.  She is also noted to have UTI and started on Rocephin .  Noted to have acute HFrEF on 6/27 after 2D echocardiogram performed 6/26 demonstrating LVEF 35%.  Seen by cardiology with recommendations for IV Lasix  at this time.  She is not a candidate for catheterization.  Assessment & Plan:   Principal Problem:   Hyponatremia Active Problems:   Hyperlipidemia associated with type 2 diabetes mellitus (HCC)   Persistent atrial fibrillation (HCC)   CAD (coronary artery disease)   Acquired hypothyroidism   Prolonged QT interval   Elevated brain natriuretic peptide (BNP) level   Transaminitis   Type 2 diabetes mellitus with hyperglycemia (HCC)  Assessment and Plan:  Hypervolemic hyponatremia-stable Sodium level 129 Continue diuresis with IV Lasix  Adjust levothyroxine  as below   Acute HFrEF New echocardiogram with LVEF 35% with septal dyskinesis and hypokinesis to multiple areas, appreciate cardiology recommendations BNP 2,362 Continue total  input/output, daily weights and fluid restriction Continue IV Lasix  40 mg twice daily per cardiology Continue heart healthy/carb modified diet        UTI Rocephin  completed after 3 days Gram-negative rod and urine cultures, further ID and sensitivity pending   Transaminitis-increasing Continues to remain elevated, monitor closely Abdominal ultrasound and hepatitis panel ordered and pending This may be due to congestive hepatopathy Continue IV Lasix  40 mg twice daily Continue to follow in a.m. Hold statin   Type 2 diabetes mellitus with hyperglycemia Hemoglobin A1c on 05/24/2023 was 6.5 Continue ISS and hypoglycemia protocol   Persistent atrial fibrillation Continue Toprol , Eliquis , and amiodarone    CAD Continue aspirin , Toprol  XL per home regimen   Acquired hypothyroidism Continue Synthroid  TSH elevated, free T4 elevated at 1.25 Goal TSH should be 6-7, plan to increase Synthroid  dose slowly   Mixed hyperlipidemia Hold statin given transaminitis  DVT prophylaxis:apixaban  Code Status: DNR Family Communication: None at bedside Disposition Plan:  Status is: Inpatient Remains inpatient appropriate because: Need for IV medications.   Consultants:  Palliative care Discussed with nephrology Cardiology  Procedures:  None  Antimicrobials:  Anti-infectives (From admission, onward)    Start     Dose/Rate Route Frequency Ordered Stop   07/12/23 2000  cefTRIAXone  (ROCEPHIN ) 2 g in sodium chloride  0.9 % 100 mL IVPB        2 g 200 mL/hr over 30 Minutes Intravenous Every 24 hours 07/12/23 1821         Subjective: Patient seen and evaluated today with no new acute complaints or concerns. No acute concerns or events noted overnight.  Patient is somnolent, but arousable.  Appears to be diuresing well.  Objective: Vitals:   07/14/23 2007 07/14/23 2028 07/15/23 0500 07/15/23 0516  BP:  (!) 140/69  (!) 147/73  Pulse:  93  69  Resp:  16    Temp:  98.1 F (36.7 C)  (!)  97.5 F (36.4 C)  TempSrc:  Oral  Oral  SpO2: 100%   96%  Weight:   57.3 kg   Height:        Intake/Output Summary (Last 24 hours) at 07/15/2023 1034 Last data filed at 07/15/2023 0104 Gross per 24 hour  Intake 240 ml  Output 1751 ml  Net -1511 ml   Filed Weights   07/13/23 0446 07/14/23 0452 07/15/23 0500  Weight: 64.1 kg 59.9 kg 57.3 kg    Examination:  General exam: Appears calm and comfortable, fatigued Respiratory system: Clear to auscultation. Respiratory effort normal. Cardiovascular system: S1 & S2 heard, RRR.  Gastrointestinal system: Abdomen is soft Central nervous system: Alert and awake Extremities: No edema Skin: No significant lesions noted Psychiatry: Flat affect.    Data Reviewed: I have personally reviewed following labs and imaging studies  CBC: Recent Labs  Lab 07/11/23 2046 07/12/23 0551 07/13/23 0752 07/14/23 0239 07/15/23 0352  WBC 6.3 6.6 13.7* 9.6 7.2  HGB 8.8* 8.5* 8.3* 8.1* 8.3*  HCT 27.3* 25.0* 24.8* 25.2* 25.6*  MCV 81.7 81.2 82.4 80.8 81.5  PLT 244 246 254 268 279   Basic Metabolic Panel: Recent Labs  Lab 07/11/23 2046 07/12/23 0551 07/12/23 1153 07/13/23 0346 07/13/23 0752 07/13/23 1052 07/14/23 0239 07/15/23 0352  NA 119* 120*   < > 124* 128* 127* 131* 129*  K 4.4 3.8  --   --  3.9  --  3.2* 3.9  CL 85* 89*  --   --  95*  --  97* 95*  CO2 21* 22  --   --  22  --  24 24  GLUCOSE 194* 146*  --   --  124*  --  132* 131*  BUN 17 16  --   --  14  --  16 18  CREATININE 1.13* 1.11*  --   --  0.99  --  1.02* 0.93  CALCIUM  8.9 8.6*  --   --  8.2*  --  8.0* 8.1*  MG  --  1.7  --   --  1.9  --  1.8 1.7  PHOS  --  3.7  --   --   --   --   --   --    < > = values in this interval not displayed.   GFR: Estimated Creatinine Clearance: 33.9 mL/min (by C-G formula based on SCr of 0.93 mg/dL). Liver Function Tests: Recent Labs  Lab 07/11/23 2046 07/12/23 0551 07/14/23 0239 07/15/23 0352  AST 103* 112* 161* 326*  ALT 80* 79*  108* 202*  ALKPHOS 80 74 71 75  BILITOT 0.6 0.6 0.7 0.7  PROT 6.5 6.1* 5.7* 5.8*  ALBUMIN 3.3* 3.1* 2.7* 2.8*   No results for input(s): LIPASE, AMYLASE in the last 168 hours. No results for input(s): AMMONIA in the last 168 hours. Coagulation Profile: Recent Labs  Lab 07/15/23 0725  INR 1.6*   Cardiac Enzymes: No results for input(s): CKTOTAL, CKMB, CKMBINDEX, TROPONINI in the last 168 hours. BNP (last 3 results) No results for input(s): PROBNP in the last 8760 hours. HbA1C: No results for input(s): HGBA1C in the last 72 hours. CBG: Recent Labs  Lab 07/14/23 [phone]  07/14/23 1127 07/14/23 1525 07/14/23 1950 07/15/23 0713  GLUCAP 106* 185* 107* 146* 146*   Lipid Profile: No results for input(s): CHOL, HDL, LDLCALC, TRIG, CHOLHDL, LDLDIRECT in the last 72 hours. Thyroid  Function Tests: Recent Labs    07/14/23 0239  FREET4 1.25*   Anemia Panel: No results for input(s): VITAMINB12, FOLATE, FERRITIN, TIBC, IRON , RETICCTPCT in the last 72 hours. Sepsis Labs: No results for input(s): PROCALCITON, LATICACIDVEN in the last 168 hours.  Recent Results (from the past 240 hours)  MRSA Next Gen by PCR, Nasal     Status: None   Collection Time: 07/11/23 11:50 PM   Specimen: Nasal Mucosa; Nasal Swab  Result Value Ref Range Status   MRSA by PCR Next Gen NOT DETECTED NOT DETECTED Final    Comment: (NOTE) The GeneXpert MRSA Assay (FDA approved for NASAL specimens only), is one component of a comprehensive MRSA colonization surveillance program. It is not intended to diagnose MRSA infection nor to guide or monitor treatment for MRSA infections. Test performance is not FDA approved in patients less than 67 years old. Performed at [facility], [address] [place], [place] [address]   Urine Culture     Status: Abnormal (Preliminary result)   Collection Time: 07/12/23  7:21 AM   Specimen: Urine, Clean Catch  Result Value Ref Range  Status   Specimen Description   Final    URINE, CLEAN CATCH Performed at [facility], [address], [place], [place] [address]    Special Requests   Final    NONE Reflexed from [id number] Performed at [facility], [address] [place], [place] [address]    Culture 60,000 COLONIES/mL GRAM NEGATIVE RODS (A)  Final   Report Status PENDING  Incomplete         Radiology Studies: No results found.       Scheduled Meds:  amiodarone   200 mg Oral Daily   apixaban   2.5 mg Oral BID   aspirin  EC  81 mg Oral Daily   Chlorhexidine  Gluconate Cloth  6 each Topical Daily   feeding supplement  237 mL Oral BID BM   furosemide   40 mg Intravenous BID   insulin  aspart  0-9 Units Subcutaneous TID WC   latanoprost   1 drop Left Eye QHS   levothyroxine   150 mcg Oral Q0600   metoprolol  succinate  12.5 mg Oral Daily   pantoprazole   40 mg Oral BID AC   rosuvastatin   10 mg Oral Daily   sodium chloride   1 g Oral TID WC     LOS: 4 days    Time spent: 55 minutes    Sanyah Molnar JONETTA Fairly, DO [facility]  If 7PM-7AM, please contact night-coverage [email or web address] 07/15/2023, 10:34 AM

## 2023-07-15 NOTE — Plan of Care (Signed)
  Problem: Education: Goal: Knowledge of General Education information will improve Description: Including pain rating scale, medication(s)/side effects and non-pharmacologic comfort measures Outcome: Progressing   Problem: Clinical Measurements: Goal: Ability to maintain clinical measurements within normal limits will improve Outcome: Progressing Goal: Will remain free from infection Outcome: Progressing Goal: Diagnostic test results will improve Outcome: Progressing Goal: Respiratory complications will improve Outcome: Progressing Goal: Cardiovascular complication will be avoided Outcome: Progressing   Problem: Activity: Goal: Risk for activity intolerance will decrease Outcome: Progressing   Problem: Coping: Goal: Level of anxiety will decrease Outcome: Progressing   Problem: Elimination: Goal: Will not experience complications related to bowel motility Outcome: Progressing Goal: Will not experience complications related to urinary retention Outcome: Progressing   Problem: Safety: Goal: Ability to remain free from injury will improve Outcome: Progressing   Problem: Skin Integrity: Goal: Risk for impaired skin integrity will decrease Outcome: Progressing   Problem: Education: Goal: Ability to describe self-care measures that may prevent or decrease complications (Diabetes Survival Skills Education) will improve Outcome: Progressing Goal: Individualized Educational Video(s) Outcome: Progressing   Problem: Coping: Goal: Ability to adjust to condition or change in health will improve Outcome: Progressing   Problem: Fluid Volume: Goal: Ability to maintain a balanced intake and output will improve Outcome: Progressing

## 2023-07-15 NOTE — Plan of Care (Signed)
   Problem: Education: Goal: Knowledge of General Education information will improve Description Including pain rating scale, medication(s)/side effects and non-pharmacologic comfort measures Outcome: Progressing   Problem: Health Behavior/Discharge Planning: Goal: Ability to manage health-related needs will improve Outcome: Progressing

## 2023-07-16 DIAGNOSIS — E871 Hypo-osmolality and hyponatremia: Secondary | ICD-10-CM | POA: Diagnosis not present

## 2023-07-16 DIAGNOSIS — Z789 Other specified health status: Secondary | ICD-10-CM

## 2023-07-16 DIAGNOSIS — Z515 Encounter for palliative care: Secondary | ICD-10-CM | POA: Diagnosis not present

## 2023-07-16 DIAGNOSIS — Z7189 Other specified counseling: Secondary | ICD-10-CM

## 2023-07-16 DIAGNOSIS — E039 Hypothyroidism, unspecified: Secondary | ICD-10-CM | POA: Diagnosis not present

## 2023-07-16 DIAGNOSIS — Z66 Do not resuscitate: Secondary | ICD-10-CM | POA: Diagnosis not present

## 2023-07-16 DIAGNOSIS — I4891 Unspecified atrial fibrillation: Secondary | ICD-10-CM

## 2023-07-16 DIAGNOSIS — I5021 Acute systolic (congestive) heart failure: Secondary | ICD-10-CM | POA: Diagnosis not present

## 2023-07-16 LAB — CBC
HCT: 25.1 % — ABNORMAL LOW (ref 36.0–46.0)
Hemoglobin: 8.3 g/dL — ABNORMAL LOW (ref 12.0–15.0)
MCH: 26.9 pg (ref 26.0–34.0)
MCHC: 33.1 g/dL (ref 30.0–36.0)
MCV: 81.2 fL (ref 80.0–100.0)
Platelets: 313 10*3/uL (ref 150–400)
RBC: 3.09 MIL/uL — ABNORMAL LOW (ref 3.87–5.11)
RDW: 16.8 % — ABNORMAL HIGH (ref 11.5–15.5)
WBC: 6.7 10*3/uL (ref 4.0–10.5)
nRBC: 0.3 % — ABNORMAL HIGH (ref 0.0–0.2)

## 2023-07-16 LAB — COMPREHENSIVE METABOLIC PANEL WITH GFR
ALT: 208 U/L — ABNORMAL HIGH (ref 0–44)
AST: 273 U/L — ABNORMAL HIGH (ref 15–41)
Albumin: 2.7 g/dL — ABNORMAL LOW (ref 3.5–5.0)
Alkaline Phosphatase: 72 U/L (ref 38–126)
Anion gap: 11 (ref 5–15)
BUN: 16 mg/dL (ref 8–23)
CO2: 27 mmol/L (ref 22–32)
Calcium: 8 mg/dL — ABNORMAL LOW (ref 8.9–10.3)
Chloride: 95 mmol/L — ABNORMAL LOW (ref 98–111)
Creatinine, Ser: 0.93 mg/dL (ref 0.44–1.00)
GFR, Estimated: 59 mL/min — ABNORMAL LOW (ref >60)
Glucose, Bld: 131 mg/dL — ABNORMAL HIGH (ref 70–99)
Potassium: 2.7 mmol/L — CL (ref 3.5–5.1)
Sodium: 133 mmol/L — ABNORMAL LOW (ref 135–145)
Total Bilirubin: 0.9 mg/dL (ref 0.0–1.2)
Total Protein: 5.5 g/dL — ABNORMAL LOW (ref 6.5–8.1)

## 2023-07-16 LAB — GLUCOSE, CAPILLARY
Glucose-Capillary: 141 mg/dL — ABNORMAL HIGH (ref 70–99)
Glucose-Capillary: 144 mg/dL — ABNORMAL HIGH (ref 70–99)

## 2023-07-16 LAB — URINE CULTURE

## 2023-07-16 LAB — MAGNESIUM: Magnesium: 1.6 mg/dL — ABNORMAL LOW (ref 1.7–2.4)

## 2023-07-16 MED ORDER — BIOTENE DRY MOUTH MT LIQD: 15.0000 mL | OROMUCOSAL | Status: DC | PRN

## 2023-07-16 MED ORDER — MAGNESIUM SULFATE 2 GM/50ML IV SOLN
2.0000 g | Freq: Once | INTRAVENOUS | Status: AC
  Administered 2023-07-16: 2 g via INTRAVENOUS
  Filled 2023-07-16: qty 50

## 2023-07-16 MED ORDER — HALOPERIDOL LACTATE 5 MG/ML IJ SOLN: 0.5000 mg | INTRAMUSCULAR | Status: DC | PRN

## 2023-07-16 MED ORDER — POTASSIUM CHLORIDE CRYS ER 20 MEQ PO TBCR: 40.0000 meq | EXTENDED_RELEASE_TABLET | Freq: Once | ORAL | Status: DC

## 2023-07-16 MED ORDER — GLYCOPYRROLATE 0.2 MG/ML IJ SOLN: 0.2000 mg | INTRAMUSCULAR | Status: DC | PRN

## 2023-07-16 MED ORDER — MORPHINE SULFATE (CONCENTRATE) 10 MG /0.5 ML PO SOLN: 5.0000 mg | ORAL | Status: DC | PRN

## 2023-07-16 MED ORDER — POTASSIUM CHLORIDE 10 MEQ/100ML IV SOLN
10.0000 meq | INTRAVENOUS | Status: AC
  Administered 2023-07-16 (×4): 10 meq via INTRAVENOUS
  Filled 2023-07-16 (×4): qty 100

## 2023-07-16 MED ORDER — POTASSIUM CHLORIDE CRYS ER 20 MEQ PO TBCR
40.0000 meq | EXTENDED_RELEASE_TABLET | Freq: Once | ORAL | Status: AC
  Administered 2023-07-16: 40 meq via ORAL
  Filled 2023-07-16: qty 2

## 2023-07-16 MED ORDER — MORPHINE SULFATE (PF) 2 MG/ML IV SOLN: 1.0000 mg | INTRAVENOUS | Status: DC | PRN

## 2023-07-16 MED ORDER — HALOPERIDOL LACTATE 2 MG/ML PO CONC: 0.5000 mg | ORAL | Status: DC | PRN

## 2023-07-16 MED ORDER — GLYCOPYRROLATE 1 MG PO TABS
1.0000 mg | ORAL_TABLET | ORAL | Status: DC | PRN
Start: 2023-07-16 — End: 2023-07-17

## 2023-07-16 MED ORDER — AMIODARONE HCL 200 MG PO TABS
100.0000 mg | ORAL_TABLET | Freq: Every day | ORAL | Status: DC
  Administered 2023-07-17: 100 mg via ORAL
  Filled 2023-07-16: qty 1

## 2023-07-16 MED ORDER — HALOPERIDOL 0.5 MG PO TABS: 0.5000 mg | ORAL_TABLET | ORAL | Status: DC | PRN

## 2023-07-16 NOTE — Progress Notes (Signed)
 ICD deactivated by Joey D. On 6/30 at 1750.   Damien Silvan, RN

## 2023-07-16 NOTE — TOC Progression Note (Signed)
 Transition of Care [facility]) - Progression Note    Patient Details  Name: [name] MRN: [id number] Date of Birth: June 29, 1931  Transition of Care [facility]) CM/SW Contact  Hoy DELENA Bigness, LCSW Phone Number: 07/16/2023, 3:36 PM  Clinical Narrative:    Pt recommended to return home with hospice services. Spoke with pt's son, [name] and confirmed discharge plan. Per Alan w/ [facility] they are able to provide comfort care in house and will refer to hospice agency if residential hospice is needed. Pt's son is agreeable to plan for [facility] to provide hospice/comfort care. Pt's son denies need for DME. TOC will continue to follow for discharge.      Barriers to Discharge: Continued Medical Work up  Expected Discharge Plan and Services In-house Referral: Clinical Social Work   Post Acute Care Choice: Durable Medical Equipment Living arrangements for the past 2 months: Single Family Home                                       Social Determinants of Health (SDOH) Interventions SDOH Screenings   Food Insecurity: No Food Insecurity (07/12/2023)  Housing: Low Risk  (07/12/2023)  Transportation Needs: No Transportation Needs (07/12/2023)  Utilities: Not At Risk (07/12/2023)  Alcohol  Screen: Low Risk  (09/27/2020)  Depression (PHQ2-9): Low Risk  (07/11/2021)  Financial Resource Strain: Medium Risk (07/02/2023)   Received from [facility]  Physical Activity: Inactive (03/15/2021)  Social Connections: Moderately Isolated (09/27/2020)  Stress: Stress Concern Present (03/15/2021)  Tobacco Use: Low Risk  (07/12/2023)    Readmission Risk Interventions    07/16/2023    3:36 PM 07/12/2023   12:13 PM  Readmission Risk Prevention Plan  Transportation Screening Complete Complete  PCP or Specialist Appt within 3-5 Days Complete   HRI or Home Care Consult Complete Complete  Social Work Consult for Recovery Care Planning/Counseling Complete Complete  Palliative Care Screening Complete  Not Applicable  Medication Review Oceanographer) Complete Complete

## 2023-07-16 NOTE — Progress Notes (Signed)
 Rounding Note   Patient Name: [name] Date of Encounter: 07/16/2023  [facility] Cardiologist: Jerel Balding, MD   Subjective No complaints  Scheduled Meds:  amiodarone   200 mg Oral Daily   apixaban   2.5 mg Oral BID   aspirin  EC  81 mg Oral Daily   Chlorhexidine  Gluconate Cloth  6 each Topical Daily   feeding supplement  237 mL Oral BID BM   insulin  aspart  0-9 Units Subcutaneous TID WC   latanoprost   1 drop Left Eye QHS   levothyroxine   150 mcg Oral Q0600   metoprolol  succinate  12.5 mg Oral Daily   pantoprazole   40 mg Oral BID AC   sodium chloride   1 g Oral TID WC   Continuous Infusions:  potassium chloride  10 mEq (07/16/23 0844)   PRN Meds: acetaminophen  **OR** acetaminophen , artificial tears, prochlorperazine    Vital Signs  Vitals:   07/15/23 1952 07/16/23 0349 07/16/23 0500 07/16/23 0837  BP: (!) 154/60 (!) 138/56  (!) 154/58  Pulse: 64 70  69  Resp: 18 18    Temp: 98.4 F (36.9 C) 98.7 F (37.1 C)    TempSrc: Oral Oral    SpO2: 97% 93%    Weight:   55.1 kg   Height:        Intake/Output Summary (Last 24 hours) at 07/16/2023 0931 Last data filed at 07/16/2023 9177 Gross per 24 hour  Intake 720 ml  Output 2900 ml  Net -2180 ml      07/16/2023    5:00 AM 07/15/2023    5:00 AM 07/14/2023    4:52 AM  Last 3 Weights  Weight (lbs) 121 lb 7.6 oz 126 lb 5.2 oz 132 lb 0.9 oz  Weight (kg) 55.1 kg 57.3 kg 59.9 kg      Telemetry NSR - Personally Reviewed  ECG  N/a - Personally Reviewed  Physical Exam  GEN: No acute distress.   Neck: + JVD Cardiac: RRR, no murmurs, rubs, or gallops.  Respiratory: faint crackles bilaterally GI: Soft, nontender, non-distended  MS: No edema; No deformity. Neuro:  Nonfocal  Psych: Normal affect   Labs High Sensitivity Troponin:   Recent Labs  Lab 07/11/23 2046 07/11/23 2247  TROPONINIHS 11 13     Chemistry Recent Labs  Lab 07/14/23 0239 07/15/23 0352 07/16/23 0352  NA 131* 129* 133*  K  3.2* 3.9 2.7*  CL 97* 95* 95*  CO2 24 24 27   GLUCOSE 132* 131* 131*  BUN 16 18 16   CREATININE 1.02* 0.93 0.93  CALCIUM  8.0* 8.1* 8.0*  MG 1.8 1.7 1.6*  PROT 5.7* 5.8* 5.5*  ALBUMIN 2.7* 2.8* 2.7*  AST 161* 326* 273*  ALT 108* 202* 208*  ALKPHOS 71 75 72  BILITOT 0.7 0.7 0.9  GFRNONAA 53* 59* 59*  ANIONGAP 10 10 11     Lipids No results for input(s): CHOL, TRIG, HDL, LABVLDL, LDLCALC, CHOLHDL in the last 168 hours.  Hematology Recent Labs  Lab 07/14/23 0239 07/15/23 0352 07/16/23 0352  WBC 9.6 7.2 6.7  RBC 3.12* 3.14* 3.09*  HGB 8.1* 8.3* 8.3*  HCT 25.2* 25.6* 25.1*  MCV 80.8 81.5 81.2  MCH 26.0 26.4 26.9  MCHC 32.1 32.4 33.1  RDW 16.0* 16.3* 16.8*  PLT 268 279 313   Thyroid   Recent Labs  Lab 07/12/23 1027 07/14/23 0239  TSH 36.741*  --   FREET4  --  1.25*    BNP Recent Labs  Lab 07/11/23 2046  BNP 2,362.0*  DDimer No results for input(s): DDIMER in the last 168 hours.   Radiology  US  Abdomen Limited RUQ (LIVER/GB) Result Date: 07/15/2023 CLINICAL DATA:  [id number] Transaminitis [id number] EXAM: ULTRASOUND ABDOMEN LIMITED RIGHT UPPER QUADRANT COMPARISON:  CT 04/06/2023 FINDINGS: Gallbladder: No gallstones or wall thickening visualized. Small volume sludge layering posteriorly. No sonographic Murphy sign noted by sonographer. Wall thickness 2.7 mm. Common bile duct: Diameter: 3.3 mm.  No intrahepatic biliary ductal dilatation. Liver: No focal lesion identified. Within normal limits in parenchymal echogenicity. Portal vein is patent on color Doppler imaging with normal direction of blood flow towards the liver. Other: Small right pleural effusion. IMPRESSION: 1. Small volume gallbladder sludge. No gallstones or biliary dilatation. 2. Small right pleural effusion. Electronically Signed   By: JONETTA Faes M.D.   On: 07/15/2023 11:09      Patient Profile   [name] is a 89 y.o. female with a hx of polymorphic VT (s/p ICD placement in 1993 with most  recent gen change in 2016), LBBB, paroxysmal atrial fibrillation, CAD (s/p stenting to LAD and LCx in 2003 with patent stents by cath in 2015), HTN, HLD, Stage 3 CKD, Type 2 DM and orthostatic hypotension who is being seen 07/13/2023 for the evaluation of new cardiomyopathy at the request of Dr. Maree.   Assessment & Plan   1.Acute HFrEF - 08/2022 echo: LVE 55-60% -06/2023 echo: LVEF 35%, normal RV function, severe BAE, indet diastolic, mild to mod MR - CXR + pulm edema, BNP 2362  - received IV lasix  40mg  bid yesterday. Negative 2.4 L, negative 4.7 for admission per charting. Bed weights suggest she is down 141-->121 lbs. Renal function has been stable, Na is improving with diuresis. Very low K today, getting IV and oral replacement. Low Mg, received IV replacement.  -some ongoing fluid overload. Marked electrolyte abnormalities, hold diuretics today and replace K and Mg. Likely 1-2 more days of IV diuresis, resume tomorrow.   - has refused invasive procedures for many years, now with advanced age and comorbidities, poor functional capacity in general high risk and not a candidate for cath. Treat medically best we can which will also be limited by her prior issues with orthostatic hypotension and dizziness.  - continue toprol  for now, would not titrate due to prior bradycardia.    2.Hyponatremia - per primary team - presented volume overloaded, hypervolemic hyponatremia - transiently on hypertonic saline now off. Follow Na with diuresis, if Na <125 consider tolvaptan dose.  - Na improving with diuresis   3.Hypothyroidism - TSH 36, free T4 1.25 - per primary team - on synthroid  at home, increased this admission   4. Afib - continue amiodrone, toprol , eliquis  - progressing thyroid  issues unclear if related to amio, lower dose to 100mg  daily  5. Elevated LFTs - follow with diuresis, possibly secondary to congestion - RUQ US  was benign     Agree with palliative care consultation this  admission. Keep in mind her ICD remains active if decisions are made regarding level of care.    For questions or updates, please contact [facility] Please consult [email or web address] for contact info under     Signed, Alvan Carrier, MD  07/16/2023, 9:31 AM

## 2023-07-16 NOTE — Plan of Care (Signed)
  Problem: Education: Goal: Knowledge of General Education information will improve Description: Including pain rating scale, medication(s)/side effects and non-pharmacologic comfort measures Outcome: Progressing   Problem: Health Behavior/Discharge Planning: Goal: Ability to manage health-related needs will improve Outcome: Progressing   Problem: Clinical Measurements: Goal: Ability to maintain clinical measurements within normal limits will improve Outcome: Progressing Goal: Will remain free from infection Outcome: Progressing Goal: Diagnostic test results will improve Outcome: Progressing Goal: Respiratory complications will improve Outcome: Progressing Goal: Cardiovascular complication will be avoided Outcome: Progressing   Problem: Activity: Goal: Risk for activity intolerance will decrease Outcome: Progressing   Problem: Nutrition: Goal: Adequate nutrition will be maintained Outcome: Progressing   Problem: Elimination: Goal: Will not experience complications related to bowel motility Outcome: Progressing Goal: Will not experience complications related to urinary retention Outcome: Progressing   Problem: Pain Managment: Goal: General experience of comfort will improve and/or be controlled Outcome: Progressing   Problem: Safety: Goal: Ability to remain free from injury will improve Outcome: Progressing   Problem: Skin Integrity: Goal: Risk for impaired skin integrity will decrease Outcome: Progressing   Problem: Education: Goal: Ability to describe self-care measures that may prevent or decrease complications (Diabetes Survival Skills Education) will improve Outcome: Progressing Goal: Individualized Educational Video(s) Outcome: Progressing   Problem: Coping: Goal: Ability to adjust to condition or change in health will improve Outcome: Progressing   Problem: Fluid Volume: Goal: Ability to maintain a balanced intake and output will improve Outcome:  Progressing   Problem: Health Behavior/Discharge Planning: Goal: Ability to identify and utilize available resources and services will improve Outcome: Progressing Goal: Ability to manage health-related needs will improve Outcome: Progressing   Problem: Metabolic: Goal: Ability to maintain appropriate glucose levels will improve Outcome: Progressing   Problem: Nutritional: Goal: Maintenance of adequate nutrition will improve Outcome: Progressing Goal: Progress toward achieving an optimal weight will improve Outcome: Progressing

## 2023-07-16 NOTE — Progress Notes (Signed)
 PROGRESS NOTE    ELEASE [name]  FMW:[id number] DOB: [birth year] DOA: 07/11/2023 PCP: Cleotilde Laymon HERO, NP   Brief Narrative:    [name] is a 89 y.o. female with medical history significant of chronic atrial fibrillation, CAD s/p stenting, CKD stage IIIA, frequent V. tach SP AICD placement who presents to the emergency department due to generalized weakness, shortness of breath, increased swelling in the legs and vomiting x 1 PTA.  She was recently admitted to [facility] from 6/14 to 6/18 due to hyponatremia and enterocolitis, diarrhea has since resolved, but she continued to complain of weakness.  Patient was admitted with symptomatic hyponatremia as well as acute on chronic diastolic CHF exacerbation and has been started on IV diuresis.  However she has had poor oral intake as well as some diarrhea and therefore IV Lasix  was discontinued.  Significant hyponatremia persisted and she has been started on hypertonic saline with slow correction noted.  She is also noted to have UTI and started on Rocephin  which has not been completed.  Noted to have acute HFrEF on 6/27 after 2D echocardiogram performed 6/26 demonstrating LVEF 35%.  Seen by cardiology with recommendations for IV Lasix  to continue by a.m. once hypomagnesemia and hypokalemia have resolved.  She is not a candidate for catheterization and palliative consultation is pending for today.  Anticipate another couple days of needed diuresis prior to discharge.  Assessment & Plan:   Principal Problem:   Hyponatremia Active Problems:   Hyperlipidemia associated with type 2 diabetes mellitus (HCC)   Persistent atrial fibrillation (HCC)   CAD (coronary artery disease)   Acquired hypothyroidism   Prolonged QT interval   Elevated brain natriuretic peptide (BNP) level   Transaminitis   Type 2 diabetes mellitus with hyperglycemia (HCC)  Assessment and Plan:  Hypervolemic hyponatremia-stable Sodium level 133 Continue diuresis with IV Lasix   by a.m. once electrolyte abnormalities have been corrected Adjust levothyroxine  as below  Severe hypokalemia/hypomagnesemia Replete and reevaluate in a.m.   Acute HFrEF New echocardiogram with LVEF 35% with septal dyskinesis and hypokinesis to multiple areas, appreciate cardiology recommendations BNP 2,362 Continue total input/output, daily weights and fluid restriction Continue IV Lasix  40 mg twice daily per cardiology, hopefully by a.m. once electrolyte abnormalities have been resolved Continue heart healthy/carb modified diet    Palliative care consultation today given advanced age and comorbidities with poor functional capacity, likely would benefit from hospice on discharge rather than SNF     E. coli UTI Rocephin  completed after 3 days   Transaminitis-improving Continues to remain elevated, monitor closely Abdominal ultrasound and hepatitis panel unremarkable This may be due to congestive hepatopathy Plan to continue further diuresis by 7/1 Continue to follow in a.m. Hold statin   Type 2 diabetes mellitus with hyperglycemia Hemoglobin A1c on 05/24/2023 was 6.5 Continue ISS and hypoglycemia protocol   Persistent atrial fibrillation Continue Toprol , Eliquis , and amiodarone  with dose adjustment due to thyroid  issues   CAD Continue aspirin , Toprol  XL per home regimen   Acquired hypothyroidism Continue Synthroid  TSH elevated, free T4 elevated at 1.25 Goal TSH should be 6-7, plan to increase Synthroid  dose slowly and amiodarone  adjusted per cardiology   Mixed hyperlipidemia Hold statin given transaminitis  DVT prophylaxis:apixaban  Code Status: DNR Family Communication: None at bedside Disposition Plan:  Status is: Inpatient Remains inpatient appropriate because: Need for IV medications.   Consultants:  Palliative care Discussed with nephrology Cardiology  Procedures:  None  Antimicrobials:  Anti-infectives (From admission, onward)    Start  Dose/Rate  Route Frequency Ordered Stop   07/12/23 2000  cefTRIAXone  (ROCEPHIN ) 2 g in sodium chloride  0.9 % 100 mL IVPB  Status:  Discontinued        2 g 200 mL/hr over 30 Minutes Intravenous Every 24 hours 07/12/23 1821 07/15/23 1037       Subjective: Patient seen and evaluated today with no new acute complaints or concerns. No acute concerns or events noted overnight.  Patient is eager for discharge and would like to go home if possible.  Noted to have significant electrolyte abnormalities this morning.  Objective: Vitals:   07/15/23 1952 07/16/23 0349 07/16/23 0500 07/16/23 0837  BP: (!) 154/60 (!) 138/56  (!) 154/58  Pulse: 64 70  69  Resp: 18 18    Temp: 98.4 F (36.9 C) 98.7 F (37.1 C)    TempSrc: Oral Oral    SpO2: 97% 93%    Weight:   55.1 kg   Height:        Intake/Output Summary (Last 24 hours) at 07/16/2023 1028 Last data filed at 07/16/2023 9177 Gross per 24 hour  Intake 720 ml  Output 2900 ml  Net -2180 ml   Filed Weights   07/14/23 0452 07/15/23 0500 07/16/23 0500  Weight: 59.9 kg 57.3 kg 55.1 kg    Examination:  General exam: Appears calm and comfortable, fatigued Respiratory system: Clear to auscultation. Respiratory effort normal. Cardiovascular system: S1 & S2 heard, RRR.  Gastrointestinal system: Abdomen is soft Central nervous system: Alert and awake Extremities: No edema Skin: No significant lesions noted Psychiatry: Flat affect.    Data Reviewed: I have personally reviewed following labs and imaging studies  CBC: Recent Labs  Lab 07/12/23 0551 07/13/23 0752 07/14/23 0239 07/15/23 0352 07/16/23 0352  WBC 6.6 13.7* 9.6 7.2 6.7  HGB 8.5* 8.3* 8.1* 8.3* 8.3*  HCT 25.0* 24.8* 25.2* 25.6* 25.1*  MCV 81.2 82.4 80.8 81.5 81.2  PLT 246 254 268 279 313   Basic Metabolic Panel: Recent Labs  Lab 07/12/23 0551 07/12/23 1153 07/13/23 0752 07/13/23 1052 07/14/23 0239 07/15/23 0352 07/16/23 0352  NA 120*   < > 128* 127* 131* 129* 133*  K 3.8   --  3.9  --  3.2* 3.9 2.7*  CL 89*  --  95*  --  97* 95* 95*  CO2 22  --  22  --  24 24 27   GLUCOSE 146*  --  124*  --  132* 131* 131*  BUN 16  --  14  --  16 18 16   CREATININE 1.11*  --  0.99  --  1.02* 0.93 0.93  CALCIUM  8.6*  --  8.2*  --  8.0* 8.1* 8.0*  MG 1.7  --  1.9  --  1.8 1.7 1.6*  PHOS 3.7  --   --   --   --   --   --    < > = values in this interval not displayed.   GFR: Estimated Creatinine Clearance: 33.9 mL/min (by C-G formula based on SCr of 0.93 mg/dL). Liver Function Tests: Recent Labs  Lab 07/11/23 2046 07/12/23 0551 07/14/23 0239 07/15/23 0352 07/16/23 0352  AST 103* 112* 161* 326* 273*  ALT 80* 79* 108* 202* 208*  ALKPHOS 80 74 71 75 72  BILITOT 0.6 0.6 0.7 0.7 0.9  PROT 6.5 6.1* 5.7* 5.8* 5.5*  ALBUMIN 3.3* 3.1* 2.7* 2.8* 2.7*   No results for input(s): LIPASE, AMYLASE in the last 168 hours. No results  for input(s): AMMONIA in the last 168 hours. Coagulation Profile: Recent Labs  Lab 07/15/23 0725  INR 1.6*   Cardiac Enzymes: No results for input(s): CKTOTAL, CKMB, CKMBINDEX, TROPONINI in the last 168 hours. BNP (last 3 results) No results for input(s): PROBNP in the last 8760 hours. HbA1C: No results for input(s): HGBA1C in the last 72 hours. CBG: Recent Labs  Lab 07/15/23 0713 07/15/23 1108 07/15/23 1613 07/15/23 1954 07/16/23 0719  GLUCAP 146* 178* 193* 117* 141*   Lipid Profile: No results for input(s): CHOL, HDL, LDLCALC, TRIG, CHOLHDL, LDLDIRECT in the last 72 hours. Thyroid  Function Tests: Recent Labs    07/14/23 0239  FREET4 1.25*   Anemia Panel: No results for input(s): VITAMINB12, FOLATE, FERRITIN, TIBC, IRON , RETICCTPCT in the last 72 hours. Sepsis Labs: No results for input(s): PROCALCITON, LATICACIDVEN in the last 168 hours.  Recent Results (from the past 240 hours)  MRSA Next Gen by PCR, Nasal     Status: None   Collection Time: 07/11/23 11:50 PM   Specimen: Nasal  Mucosa; Nasal Swab  Result Value Ref Range Status   MRSA by PCR Next Gen NOT DETECTED NOT DETECTED Final    Comment: (NOTE) The GeneXpert MRSA Assay (FDA approved for NASAL specimens only), is one component of a comprehensive MRSA colonization surveillance program. It is not intended to diagnose MRSA infection nor to guide or monitor treatment for MRSA infections. Test performance is not FDA approved in patients less than 30 years old. Performed at [facility], [address] [place], [place] [address]   Urine Culture     Status: Abnormal   Collection Time: 07/12/23  7:21 AM   Specimen: Urine, Clean Catch  Result Value Ref Range Status   Specimen Description   Final    URINE, CLEAN CATCH Performed at [facility], [address] [place], [place] [address]    Special Requests   Final    NONE Reflexed from [phone] [id number] Performed at [facility], [address], [place], [place] [address]    Culture (A)  Final    60,000 COLONIES/mL ESCHERICHIA COLI >=100,000 COLONIES/mL LACTOBACILLUS SPECIES Standardized susceptibility testing for this organism is not available. Performed at [facility], [address], [place], [place] [address]    Report Status 07/16/2023 FINAL  Final   Organism ID, Bacteria ESCHERICHIA COLI (A)  Final      Susceptibility   Escherichia coli - MIC*    AMPICILLIN  >=32 RESISTANT Resistant     CEFAZOLIN  16 SENSITIVE Sensitive     CEFEPIME <=0.12 SENSITIVE Sensitive     CEFTRIAXONE  <=0.25 SENSITIVE Sensitive     CIPROFLOXACIN  <=0.25 SENSITIVE Sensitive     GENTAMICIN  <=1 SENSITIVE Sensitive     IMIPENEM <=0.25 SENSITIVE Sensitive     NITROFURANTOIN  <=16 SENSITIVE Sensitive     TRIMETH /SULFA  >=320 RESISTANT Resistant     AMPICILLIN /SULBACTAM >=32 RESISTANT Resistant     PIP/TAZO 32 INTERMEDIATE Intermediate ug/mL    * 60,000 COLONIES/mL ESCHERICHIA COLI         Radiology Studies: US  Abdomen Limited RUQ (LIVER/GB) Result Date: 07/15/2023 CLINICAL  DATA:  [id number] Transaminitis [id number] EXAM: ULTRASOUND ABDOMEN LIMITED RIGHT UPPER QUADRANT COMPARISON:  CT 04/06/2023 FINDINGS: Gallbladder: No gallstones or wall thickening visualized. Small volume sludge layering posteriorly. No sonographic Murphy sign noted by sonographer. Wall thickness 2.7 mm. Common bile duct: Diameter: 3.3 mm.  No intrahepatic biliary ductal dilatation. Liver: No focal lesion identified. Within normal limits in parenchymal echogenicity. Portal  vein is patent on color Doppler imaging with normal direction of blood flow towards the liver. Other: Small right pleural effusion. IMPRESSION: 1. Small volume gallbladder sludge. No gallstones or biliary dilatation. 2. Small right pleural effusion. Electronically Signed   By: JONETTA Faes M.D.   On: 07/15/2023 11:09         Scheduled Meds:  [START ON 07/17/2023] amiodarone   100 mg Oral Daily   apixaban   2.5 mg Oral BID   aspirin  EC  81 mg Oral Daily   Chlorhexidine  Gluconate Cloth  6 each Topical Daily   feeding supplement  237 mL Oral BID BM   insulin  aspart  0-9 Units Subcutaneous TID WC   latanoprost   1 drop Left Eye QHS   levothyroxine   150 mcg Oral Q0600   metoprolol  succinate  12.5 mg Oral Daily   pantoprazole   40 mg Oral BID AC   potassium chloride   40 mEq Oral Once   sodium chloride   1 g Oral TID WC     LOS: 5 days    Time spent: 55 minutes    Jaman Aro JONETTA Fairly, DO [facility]  If 7PM-7AM, please contact night-coverage [email or web address] 07/16/2023, 10:28 AM

## 2023-07-16 NOTE — Progress Notes (Signed)
 Mobility Specialist Progress Note:    07/16/23 1211  Mobility  Activity Turned to right side;Turned to left side;Stood at bedside;Transferred from bed to chair  Level of Assistance Moderate assist, patient does 50-74%  Assistive Device None (hha)  Distance Ambulated (ft) 3 ft  Range of Motion/Exercises Active;All extremities  Activity Response Tolerated well  Mobility visit 1 Mobility  Mobility Specialist Start Time (ACUTE ONLY) 1158  Mobility Specialist Stop Time (ACUTE ONLY) 1211  Mobility Specialist Time Calculation (min) (ACUTE ONLY) 13 min   Nursing saff requested assistance to transfer pt. Required ModA to stand and transfer with hand-held assist. Tolerated well, asx throughout. Left pt with nursing staff, all needs met.   Sherrilee Ditty Mobility Specialist Please contact via Special educational needs teacher or  Rehab office at [phone]

## 2023-07-16 NOTE — Consult Note (Signed)
 Palliative Care Consult Note                                  Date: 07/16/2023   Patient Name: [name]  DOB: [birth year]  MRN: [id number]  Age / Sex: 89 y.o., female  PCP: [name] HERO, NP Referring Physician: Maree [name] BIRCH, DO  Reason for Consultation: Establishing goals of care  Past Medical History:  Diagnosis Date   AICD (automatic cardioverter/defibrillator) present    Allergy    SESONAL   Aneurysm of infrarenal abdominal aorta (HCC)    Anxiety    ARTHRITIS    Arthritis    Atrial fibrillation (HCC)    CAD (coronary artery disease)    a. history of cardiac arrest 1993;  b. s/p LAD/LCX stenting in 2003;  c. 07/2011 Cath: LM nl, LAD patent stent, D1 80ost, LCX patent stent, RCA min irregs;  d. 04/2012 MV: EF 66%, no ishcemia;  e. 12/2013 Echo: Ef 55%, no rwma, Gr 1 DD, triv AI/MR, mildly dil LA;  f. 12/2013 Lexi MV: intermediate risk - apical ischemia and inf/infsept fixed defect, ? artifactual.   CAD in native artery 12/31/2013   Previous stents to LAD and Ramus patent on cath today 12/31/13 also There is severe disease in the ostial first diagonal which is unchanged from most recent cardiac catheterization. The right coronary artery could not be engaged selectively but nonselective angiography showed no significant disease in the proximal and midsegment.       Cataract    DENIES   Chronic back pain    Chronic sinus bradycardia    CKD (chronic kidney disease), stage III (HCC)    Clotting disorder (HCC)    DVT   COLITIS 12/02/2007   Qualifier: Diagnosis of  By: [name] CMA [name]), Chick     Diabetes mellitus without complication (HCC)    DENIES   Diverticulosis of colon (without mention of hemorrhage) 2007   Colonoscopy    Esophageal stricture    a. 2012 s/p dil.   Esophagitis, unspecified    a. 2012 EGD   EXTERNAL HEMORRHOIDS    GERD (gastroesophageal reflux disease)    omeprazole   H/O hiatal hernia     Hiatal hernia    a. 2012 EGD.   History of DVT in the past, not on Coumadin now    left  leg   HYPERCHOLESTEROLEMIA    Hypertension    ICD (implantable cardiac defibrillator) in place    a. s/p initial ICD in 1993 in setting of cardiac arrest;  b. 01/2007 gen change: Guidant T135 Vitality DS VR single lead ICD.   Macular degeneration    gets injecton in eye every 5 weeks- last injection - 05/03/2013    Myocardial infarction [facility]) 1993   Near syncope 05/22/2015   Nephrolithiasis, just saw Dr Watt- [place]    Orthostatic hypotension    Peripheral vascular disease (HCC)    ???   RECTAL BLEEDING 12/03/2007   Qualifier: Diagnosis of  By: [name]    Unspecified gastritis and gastroduodenitis without mention of hemorrhage    a. 2003 EGD->not noted on 2012 EGD.   Unstable angina, neg MI, cath stable.maybe GI 12/30/2013    Subjective:   This NP [name] reviewed medical records, received report from team, assessed the patient and then meet at the patient's bedside to discuss diagnosis, prognosis, GOC, EOL wishes  disposition and options.  Before meeting with the patient/family, I spent time reviewing the chart including cardiology note from today, hospitalist note from today, social worker note from today.  Patient is stable, sodium level improving, remains encephalopathic, now with acute HFrEF with new LVEF 35% (previously 45% and before that middle of last year was 55%).  Palliative medicine was consulted because of advanced age, multiple comorbidities with poor functional capacity and likely benefit of hospice on discharge.  Cardiology is in agreement with palliative consult as well.  I reviewed labs today which showed CBC with no leukocytosis, stable but low hemoglobin at 8.3, hypomagnesemia at 1.6, severe hypokalemia with potassium of 2.7, persistent hyponatremia though improved at 133, stable/normal kidney function, mildly improved transaminitis with AST/ALT now 273/208  likely congestive in nature.  I met with the patient at the bedside, no family was present.  Later in the day I called the patient's son/POA [name] but he was unavailable to speak so I spoke with his wife [name].  She states that they met with [facility] program today and requested I call to speak with the patient's son [name] for information and decision making.  I called and left a voicemail for [name] who call me back sometime later and we had a discussion as outlined below.   We meet to discuss diagnosis prognosis, GOC, EOL wishes, disposition and options. Concept of Palliative Care was introduced as specialized medical care for people and their families living with serious illness.  If focuses on providing relief from the symptoms and stress of a serious illness.  The goal is to improve quality of life for both the patient and the family. Values and goals of care important to patient and family were attempted to be elicited.  Created space and opportunity for patient  and family to explore thoughts and feelings regarding current medical situation   Natural trajectory and current clinical status were discussed. Questions and concerns addressed. Patient  encouraged to call with questions or concerns.    Patient/Family Understanding of Illness: The patient thinks she is here because of stomach problems and constipation.  She states her heart is no worse than normal when she saw her cardiologist last year.  She states that her heart pumping function is like the usual.  She does not seem to understand her acute heart failure situation and fluid overload.  When I spoke with [name] he states that they have had her at home but she is gotten progressively worse and this is the second time in 2 weeks she has had to come to the hospital.  She refused SNF/rehab previously.  She does not know where she is at, not eating well, even needing a bedpan due to weakness limiting her ability to get up and use the commode.   [name] says he and the family know that she has fluid around her heart and sees Dr. JAYSON in [place] for cardiology.  I explained her acute heart failure which she understands.  At baseline she is enrolled in the [facility] and has a caregiver 30 hours/week.  Her children give meds and put her back to bed, which has been difficult lately.  They also have a daily caregiver through the night.  All of her children live next-door except for Ronnie who lives 10 minutes away.  Life Review: The patient has 4 children, 3 boys and 1 girl, whom she state all live locally.  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  Things that bring her happiness include her family and family events.  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   Patient Values: Family  Goals: To be determined, see ACP note below  Today's Discussion: In addition to discussions described above we had extensive discussion of various topics.  The patient states that she lives at home and has an aide all day.  Her kids see her in the evening and she has a good friend stay overnight often.  She states that she does pretty good with ADLs but needs help in the shower.  She does talk about her poor vision including 80% blind in her left eye and right eye not great either.  She notes that her biggest fear is going blind.  Today she denies pain, nausea, vomiting.  She states her appetite is not very good because of her stomach problems.  She states her family is having a meeting with the [facility] program today at 12:00.  When I spoke with [name] he states that she thinks he is ready to come home.  He understands that she does not understand her heart.  He states that they have had a good meeting with [facility] today and date informed them that palliative medicine from the hospital would likely be  reaching out very shortly.  He and his siblings have all had discussions.  He understands, and has for some time, that she is not eating, getting weaker, and this is heading in 1 direction.  We talked about options moving forward.  1 option would be continued aggressive care.  We discussed what life look like including continued back-and-forth to the hospital as her heart failure likely continues to progresses.  We discussed the limitations of medications to treat her heart failure with likely hypotension and dizziness.  We also discussed that she is not a candidate for invasive procedures, and she has declined them in the past.  We discussed another option including comfort care moving forward. I explained comfort care as care where the patient would no longer receive aggressive medical interventions such as continuous vital signs, lab work, radiology testing, or medications not focused on comfort, peace, and dignity. This includes stopping antibiotics and weaning oxygen to room air, as these are generally not accepted as providing comfort but only prolonging the dying process artificially. All care would focus on how the patient is looking and feeling. This would include management of any symptoms that may cause discomfort, pain, shortness of breath/air hunger, increased work of breathing, cough, nausea, agitation/restlessness, anxiety, and/or secretions etc. Symptoms would be managed with medications and other non-pharmacological interventions such as spiritual support if requested, repositioning, music therapy, or therapeutic listening. Family verbalized understanding and agreement.   We also discussed if comfort care is the past chosen that out of the hospital hospice would be an option. I described hospice as a service for patients who have a life expectancy of 6 months or less. The goal of hospice is the preservation of dignity and quality at the end phases of life. Under hospice care, the focus changes  from curative to symptom relief. I explained the three setting where hospice services can be provided including the home, at a living facility (such as LTC SNF, Assisted Living, etc), and a hospice facility. I explained that acceptance to hospice in any specific  location is the final decision of the hospice medical director and bed availability, if applicable. They verbalized understanding.  I did a my conversation [name] states that he has spoken with all of his siblings and they are all in agreement for keeping her comfortable and decreasing her pain is much as possible.  They understand where things are and would like to transition to comfort care.  They would also like to speak to hospice to see what options they would have with hospice care.  However, they are concerned about ability for family to provide care around-the-clock which may be required if she does home hospice.  We also talked about inpatient hospice is an option although I question whether she would qualify today.  We agreed for hospice to reach out and speak with the patient's family to help them make choices for logistically how to provide care.  I shared that palliative medicine continue to follow while she is on comfort care.  I provided contact information for the palliative medicine team and encouraged them to reach out for any questions or concerns. I provided emotional and general support through therapeutic listening, empathy, sharing of stories, therapeutic touch, and other techniques. I answered all questions and addressed all concerns to the best of my ability.  Review of Systems  Constitutional:  Positive for fatigue.       Denies pain in general  Respiratory:  Negative for shortness of breath.   Cardiovascular:  Negative for chest pain.  Gastrointestinal:  Negative for nausea and vomiting.  Neurological:  Positive for weakness.    Objective:   Primary Diagnoses: Present on Admission:  Hyponatremia  Prolonged QT  interval  Persistent atrial fibrillation (HCC)  CAD (coronary artery disease)  Hyperlipidemia associated with type 2 diabetes mellitus (HCC)  Acquired hypothyroidism   Vital Signs:  BP (!) 154/58   Pulse 69   Temp 98.7 F (37.1 C) (Oral)   Resp 18   Ht 5' 3 (1.6 m)   Wt 55.1 kg   SpO2 93%   BMI 21.52 kg/m   Physical Exam Vitals and nursing note reviewed.  Constitutional:      General: She is not in acute distress.    Appearance: She is ill-appearing.  HENT:     Head: Normocephalic and atraumatic.   Cardiovascular:     Rate and Rhythm: Normal rate.  Pulmonary:     Effort: Pulmonary effort is normal. No respiratory distress.  Abdominal:     General: Abdomen is flat.   Neurological:     General: No focal deficit present.     Mental Status: She is confused.   Psychiatric:        Mood and Affect: Mood normal.        Behavior: Behavior normal.     Palliative Assessment/Data: 30-40%   Advanced Care Planning:   Existing Vynca/ACP Documentation: Advance directive signed 01/26/2020 DNR signed 01/26/2020  Primary Decision Maker: HCPOA  Pertinent diagnosis: Acute HFrEF, hypervolemic hyponatremia, severe hypokalemia, transaminitis likely congestive  The patient and/or family consented to a voluntary Advance Care Planning Conversation over the phone. Individuals present for the conversation: This NP, patient's son Savayah Waltrip  Summary of the conversation: We discussed the current clinical situation, the progression of recently onset acute HFrEF now at 35%, failure to thrive dynamics including poor appetite, progressive weakness.  Patient's family and children understand that she is getting progressively weaker and sicker.  We discussed options moving forward including continued aggressive care versus a  more comfort focused approach.  Outcome of the conversations and/or documents completed: At the end of the conversation [name] shares that he is communicated with all  of his siblings and they are all in agreement for transition to comfort care, engaging with hospice to discuss hospice options. Family specifically agreed to deactivation of ICD given comfort care transition.  I spent 30 minutes providing separately identifiable ACP services with the patient and/or surrogate decision maker in a voluntary, in-person conversation discussing the patient's wishes and goals as detailed in the above note.  Assessment & Plan:   HPI/Patient Profile: 89 y.o. female  with past medical history of chronic atrial fibrillation, CAD s/p stenting, CKD stage IIIA, frequent V. tach SP AICD placement who presented to the emergency department due to generalized weakness, shortness of breath, increased leg edema, and vomiting x 1 PTA.  She was admitted on 07/11/2023 with hypervolemic hyponatremia, severe hypokalemia/hypomagnesemia, acute HFrEF, E. coli UTI, transaminitis, and others.   Palliative medicine was consulted for GOC conversations.  SUMMARY OF RECOMMENDATIONS   Changed to DNR-comfort Transition to comfort care See symptom management orders below [facility] consult for referral for outpatient hospice (possibly with [facility] program) Palliative medicine will continue to follow  Symptom Management:  Tylenol  650 mg PR every 6 hours as needed mild pain or fever Biotene solution 15 mL topical as needed dry mouth Artificial tears 1 drop OU 3 times daily as needed dry eyes Robinul  0.2 mg IV every 4 hours as needed excess secretions Haldol 0.5 mg IV every 4 hours as needed agitation or delirium Morphine  1 to 4 mg IV Q 50 minutes as needed severe pain (7-10), signs/symptoms of distress Morphine  concentrate oral/sublingual every 2 hours as needed moderate pain (4-6) or dyspnea Compazine  10 mg IV every 6 hours as needed nausea or vomiting  Code Status: DNR-comfort  Prognosis:  < 4 weeks  Discharge Planning:  To Be Determined   Discussed with: Patient, family, medical team, nursing  team, [facility] team    Thank you for allowing us  to participate in the care of NOVELLE ADDAIR PMT will continue to support holistically.  Billing based on MDM: High  Problems Addressed: One acute or chronic illness or injury that poses a threat to life or bodily function  Amount and/or Complexity of Data: Category 1:Review of prior external note(s) from each unique source, Review of the result(s) of each unique test, and Assessment requiring an independent historian(s) and Category 3:Discussion of management or test interpretation with external physician/other qualified health care professional/appropriate source (not separately reported)  Risks: Parenteral controlled substances and Decision not to resuscitate or to de-escalate care because of poor prognosis   Detailed review of medical records (labs, imaging, vital signs), medically appropriate exam, discussed with treatment team, counseling and education to patient, family, & staff, documenting clinical information, medication management, coordination of care  Signed by: Renee Kays, NP Palliative Medicine Team  Team Phone # [phone] (Nights/Weekends)  07/16/2023, 12:40 PM

## 2023-07-16 NOTE — Progress Notes (Signed)
 Notified by palliative care that family and patient have elected for comfort care. ICD therapies were discussed and family/patient agree to deactivate therapies. [place] scientific representative Joey Deakins was notified with the assistance  of PA Leverne to deactivate ICD therapies.  Cardiology will sign off inpatient care.     Dorn Ross MD

## 2023-07-17 DIAGNOSIS — Z515 Encounter for palliative care: Secondary | ICD-10-CM | POA: Diagnosis not present

## 2023-07-17 DIAGNOSIS — E871 Hypo-osmolality and hyponatremia: Secondary | ICD-10-CM | POA: Diagnosis not present

## 2023-07-17 DIAGNOSIS — I509 Heart failure, unspecified: Secondary | ICD-10-CM

## 2023-07-17 DIAGNOSIS — R7989 Other specified abnormal findings of blood chemistry: Secondary | ICD-10-CM | POA: Diagnosis not present

## 2023-07-17 DIAGNOSIS — E1165 Type 2 diabetes mellitus with hyperglycemia: Secondary | ICD-10-CM | POA: Diagnosis not present

## 2023-07-17 DIAGNOSIS — E039 Hypothyroidism, unspecified: Secondary | ICD-10-CM | POA: Diagnosis not present

## 2023-07-17 DIAGNOSIS — Z66 Do not resuscitate: Secondary | ICD-10-CM | POA: Diagnosis not present

## 2023-07-17 DIAGNOSIS — Z7189 Other specified counseling: Secondary | ICD-10-CM | POA: Diagnosis not present

## 2023-07-17 MED ORDER — SCOPOLAMINE 1 MG/3DAYS TD PT72
1.0000 | MEDICATED_PATCH | TRANSDERMAL | 1 refills | Status: DC
Start: 2023-07-17 — End: 2023-11-12

## 2023-07-17 MED ORDER — SCOPOLAMINE 1 MG/3DAYS TD PT72
1.0000 | MEDICATED_PATCH | TRANSDERMAL | Status: DC
  Administered 2023-07-17: 1.5 mg via TRANSDERMAL
  Filled 2023-07-17: qty 1

## 2023-07-17 MED ORDER — HALOPERIDOL LACTATE 2 MG/ML PO CONC: 0.5000 mg | Freq: Four times a day (QID) | ORAL | 0 refills | Status: DC | PRN

## 2023-07-17 MED ORDER — MORPHINE SULFATE (CONCENTRATE) 10 MG /0.5 ML PO SOLN: 5.0000 mg | ORAL | 0 refills | Status: DC | PRN

## 2023-07-17 NOTE — Care Management Important Message (Signed)
 Important Message  Patient Details  Name: [name] MRN: [id number] Date of Birth: 07/02/1931   Important Message Given:  No     [name] 07/17/2023, 1:11 PM

## 2023-07-17 NOTE — Progress Notes (Signed)
 Daily Progress Note   Date: 07/17/2023   Patient Name: [name]  DOB: [birth year]  MRN: [id number]  Age / Sex: 89 y.o., female  Attending Physician: Ricky Fines, MD Primary Care Physician: Cleotilde Laymon HERO, NP Admit Date: 07/11/2023 Length of Stay: 6 days  Reason for Consultation: Establishing goals of care  Past Medical History:  Diagnosis Date   AICD (automatic cardioverter/defibrillator) present    Allergy    SESONAL   Aneurysm of infrarenal abdominal aorta (HCC)    Anxiety    ARTHRITIS    Arthritis    Atrial fibrillation (HCC)    CAD (coronary artery disease)    a. history of cardiac arrest 1993;  b. s/p LAD/LCX stenting in 2003;  c. 07/2011 Cath: LM nl, LAD patent stent, D1 80ost, LCX patent stent, RCA min irregs;  d. 04/2012 MV: EF 66%, no ishcemia;  e. 12/2013 Echo: Ef 55%, no rwma, Gr 1 DD, triv AI/MR, mildly dil LA;  f. 12/2013 Lexi MV: intermediate risk - apical ischemia and inf/infsept fixed defect, ? artifactual.   CAD in native artery 12/31/2013   Previous stents to LAD and Ramus patent on cath today 12/31/13 also There is severe disease in the ostial first diagonal which is unchanged from most recent cardiac catheterization. The right coronary artery could not be engaged selectively but nonselective angiography showed no significant disease in the proximal and midsegment.       Cataract    DENIES   Chronic back pain    Chronic sinus bradycardia    CKD (chronic kidney disease), stage III (HCC)    Clotting disorder (HCC)    DVT   COLITIS 12/02/2007   Qualifier: Diagnosis of  By: Kowalk CMA LEODIS), Chick     Diabetes mellitus without complication (HCC)    DENIES   Diverticulosis of colon (without mention of hemorrhage) 2007   Colonoscopy    Esophageal stricture    a. 2012 s/p dil.   Esophagitis, unspecified    a. 2012 EGD   EXTERNAL HEMORRHOIDS    GERD (gastroesophageal reflux disease)    omeprazole   H/O hiatal hernia    Hiatal hernia    a. 2012  EGD.   History of DVT in the past, not on Coumadin now    left  leg   HYPERCHOLESTEROLEMIA    Hypertension    ICD (implantable cardiac defibrillator) in place    a. s/p initial ICD in 1993 in setting of cardiac arrest;  b. 01/2007 gen change: Guidant T135 Vitality DS VR single lead ICD.   Macular degeneration    gets injecton in eye every 5 weeks- last injection - 05/03/2013    Myocardial infarction [facility]) 1993   Near syncope 05/22/2015   Nephrolithiasis, just saw Dr Watt- [place]    Orthostatic hypotension    Peripheral vascular disease (HCC)    ???   RECTAL BLEEDING 12/03/2007   Qualifier: Diagnosis of  By: Jakie MD NOLIA Lenis R    Unspecified gastritis and gastroduodenitis without mention of hemorrhage    a. 2003 EGD->not noted on 2012 EGD.   Unstable angina, neg MI, cath stable.maybe GI 12/30/2013    Subjective:   Subjective: Chart Reviewed. Updates received. Patient Assessed. Created space and opportunity for patient  and family to explore thoughts and feelings regarding current medical situation.  Today's Discussion: Today before meeting with the patient/family, I reviewed the chart including TOC note from yesterday, cardiology note from yesterday, medication administration record, nursing flowsheets,  vitals.  No labs due to comfort care status.  Overall the patient is stable, vitals are stable/normal, no symptom managements have been needed thus far.  Per TOC notes plan is for discharge home with case to provide outpatient hospice services and they will refer to inpatient hospice if needed in the future.  Per send no DME needs identified.  Today saw the patient at bedside, she is resting in the bed greets me warmly makes and keeps eye contact during the visit.  She states she is not having any pain, no more shortness of breath than normal.  She denies nausea and vomiting.  States she ate right much this morning including a pancake and some grits.  She states she is wanting to go  home.  I shared that they are trying to coordinate services to allow her to go home and be well cared for.  She states that she has multiple children that live next-door and across the street and feels confident they will be able to take care of her well.  I shared that as soon as services are arranged we will try to get her out of the hospital.  Will plan to specifically continue her availability of parenteral opioids due to anticipate/possible symptom burden especially with cardiac etiology.  I shared that if she remains in the hospital tomorrow morning I would be back to check on her.I provided emotional and general support through therapeutic listening, empathy, sharing of stories, and other techniques. I answered all questions and addressed all concerns to the best of my ability.  Review of Systems  Constitutional:  Positive for fatigue.  Respiratory:  Negative for shortness of breath.   Cardiovascular:  Negative for chest pain.  Gastrointestinal:  Negative for abdominal pain, nausea and vomiting.  Neurological:  Positive for weakness.    Objective:   Primary Diagnoses: Present on Admission:  Hyponatremia  Prolonged QT interval  Persistent atrial fibrillation (HCC)  CAD (coronary artery disease)  Hyperlipidemia associated with type 2 diabetes mellitus (HCC)  Acquired hypothyroidism   Vital Signs:  BP 138/63 (BP Location: Left Arm)   Pulse 69   Temp 98.4 F (36.9 C) (Oral)   Resp 16   Ht 5' 3 (1.6 m)   Wt 55.1 kg   SpO2 95%   BMI 21.52 kg/m   Physical Exam Vitals and nursing note reviewed.  Constitutional:      General: She is not in acute distress.    Appearance: She is ill-appearing.  HENT:     Head: Normocephalic and atraumatic.   Cardiovascular:     Rate and Rhythm: Normal rate.  Pulmonary:     Effort: Pulmonary effort is normal. No respiratory distress.  Abdominal:     General: Abdomen is flat.   Skin:    General: Skin is warm and dry.   Neurological:      General: No focal deficit present.     Mental Status: She is alert.   Psychiatric:        Mood and Affect: Mood normal.        Behavior: Behavior normal.     Palliative Assessment/Data: 30-40%   Existing Vynca/ACP Documentation: Advance directive signed/13/2022 DNR effective 01/26/2020  Assessment & Plan:   HPI/Patient Profile:  89 y.o. female  with past medical history of chronic atrial fibrillation, CAD s/p stenting, CKD stage IIIA, frequent V. tach SP AICD placement who presented to the emergency department due to generalized weakness, shortness of breath, increased leg edema,  and vomiting x 1 PTA.  She was admitted on 07/11/2023 with hypervolemic hyponatremia, severe hypokalemia/hypomagnesemia, acute HFrEF, E. coli UTI, transaminitis, and others.    Palliative medicine was consulted for GOC conversations.  SUMMARY OF RECOMMENDATIONS   DNR-comfort Continue comfort care See symptom management orders below Anticipate discharge in next 24 to 48 hours to outpatient hospice Palliative medicine will continue to follow  Symptom Management:  Tylenol  650 mg PR every 6 hours as needed mild pain or fever Biotene solution 15 mL topical as needed dry mouth Artificial tears 1 drop OU 3 times daily as needed dry eyes Robinul  0.2 mg IV every 4 hours as needed excess secretions Haldol 0.5 mg IV every 4 hours as needed agitation or delirium Morphine  1 to 4 mg IV Q 50 minutes as needed severe pain (7-10), signs/symptoms of distress Morphine  concentrate oral/sublingual every 2 hours as needed moderate pain (4-6) or dyspnea Compazine  10 mg IV every 6 hours as needed nausea or vomiting  Code Status: DNR - comfort  Prognosis: < 4 weeks  Discharge Planning: Home with Hospice  Discussed with: Patient, medical team, nursing team  Thank you for allowing us  to participate in the care of Heron JINNY Hacking PMT will continue to support holistically.  Billing based on MDM: High  Problems  Addressed: One acute or chronic illness or injury that poses a threat to life or bodily function  Risks: Parenteral controlled substances  Detailed review of medical records (labs, imaging, vital signs), medically appropriate exam, discussed with treatment team, counseling and education to patient, family, & staff, documenting clinical information, medication management, coordination of care  [name], NP Palliative Medicine Team  Team Phone # [phone] (Nights/Weekends)  09/14/2020, 8:17 AM

## 2023-07-17 NOTE — Discharge Summary (Signed)
 Physician Discharge Summary   Patient: [name] MRN: [id number] DOB: Jun 16, 1931  Admit date:     07/11/2023  Discharge date: 07/17/23  Discharge Physician: Eric Nunnery   PCP: Cleotilde Laymon HERO, NP   Recommendations at discharge:  Continue comfort care and symptomatic management.  Discharge Diagnoses: Principal Problem:   Hyponatremia Active Problems:   Hyperlipidemia associated with type 2 diabetes mellitus (HCC)   Persistent atrial fibrillation (HCC)   CAD (coronary artery disease)   Acquired hypothyroidism   Prolonged QT interval   Elevated brain natriuretic peptide (BNP) level   Transaminitis   Type 2 diabetes mellitus with hyperglycemia (HCC)   Acute congestive heart failure (HCC)  Brief Narrative:   [name] is a 89 y.o. female with medical history significant of chronic atrial fibrillation, CAD s/p stenting, CKD stage IIIA, frequent V. tach SP AICD placement who presents to the emergency department due to generalized weakness, shortness of breath, increased swelling in the legs and vomiting x 1 PTA.  She was recently admitted to [facility] from 6/14 to 6/18 due to hyponatremia and enterocolitis, diarrhea has since resolved, but she continued to complain of weakness.  Patient was admitted with symptomatic hyponatremia as well as acute on chronic diastolic CHF exacerbation and has been started on IV diuresis.  However she has had poor oral intake as well as some diarrhea and therefore IV Lasix  was discontinued.  Significant hyponatremia persisted and she has been started on hypertonic saline with slow correction noted.  She is also noted to have UTI and started on Rocephin  which has not been completed.  Noted to have acute HFrEF on 6/27 after 2D echocardiogram performed 6/26 demonstrating LVEF 35%.  Seen by cardiology with recommendations for IV Lasix  to continue by a.m. once hypomagnesemia and hypokalemia have resolved.  She is not a candidate for catheterization and  palliative consultation is pending for today.  Anticipate another couple days of needed diuresis prior to discharge.  Assessment and Plan: Hypervolemic hyponatremia-stable Sodium level 133 -patient discharge home with hospice care -comfort management and symptomatic care to be provided.   Severe hypokalemia/hypomagnesemia -electrolyte repleted -pursuing comfort care only -no future labs   Acute HFrEF New echocardiogram with LVEF 35% with septal dyskinesis and hypokinesis to multiple areas, appreciate cardiology recommendations BNP 2,362 Continue total input/output, daily weights and fluid restriction Continue IV Lasix  40 mg twice daily per cardiology, hopefully by a.m. once electrolyte abnormalities have been resolved Continue heart healthy/carb modified diet    -Palliative care completed consultation and per GOC discussion decision made to go home with hospice. -Comfort medications prescribed.   E. coli UTI -patient completed 3 days of rocephin    Transaminitis-improving -continue to Hold statin -continue to follow symptoms    Type 2 diabetes mellitus with hyperglycemia -Hemoglobin A1c on 05/24/2023 was 6.5 -continue comfort care   Persistent atrial fibrillation -planning to continue amiodarone  -no more toprol  or eliquis  at discharge   CAD -focusing on comfort care only   Acquired hypothyroidism -Continue Synthroid    Mixed hyperlipidemia Hold statin given transaminitis and plan for comfort care only  Consultants: Palliative care Procedures performed: See below for x-ray reports. Disposition: Home with hospice Diet recommendation: Comfort feeding (pain attention to sodium intake).  DISCHARGE MEDICATION: Allergies as of 07/17/2023       Reactions   Sotalol Other (See Comments)   Torsades   Tramadol  Other (See Comments)   Hallucination, bad-dream   Actos [pioglitazone] Swelling   Adhesive [tape] Other (See Comments)  Causes sores   Atorvastatin  Other (See  Comments)   myalgia   Bactrim  [sulfamethoxazole -trimethoprim ] Itching, Rash   Itchy rash   Ciprofloxacin  Nausea Only   Contrast Media [iodinated Contrast Media] Other (See Comments)   Headache (no action/pre-med required)   Latex Rash   Pedi-pre Tape Spray [wound Dressing Adhesive] Other (See Comments)   Causes sores   Sulfa  Antibiotics Other (See Comments)   Causes sores        Medication List     STOP taking these medications    Alpha-Lipoic Acid 600 MG Tabs   amLODipine  5 MG tablet Commonly known as: NORVASC    aspirin  EC 81 MG tablet   CENTRUM SILVER ULTRA WOMENS PO   Eliquis  2.5 MG Tabs tablet Generic drug: apixaban    isosorbide  mononitrate 60 MG 24 hr tablet Commonly known as: IMDUR    MELATONIN PO   metoprolol  succinate 25 MG 24 hr tablet Commonly known as: TOPROL -XL   nitroGLYCERIN  0.4 MG SL tablet Commonly known as: NITROSTAT    rosuvastatin  10 MG tablet Commonly known as: CRESTOR    Therems Tabs   venlafaxine  37.5 MG tablet Commonly known as: EFFEXOR        TAKE these medications    acetaminophen  500 MG tablet Commonly known as: TYLENOL  Take 1 tablet (500 mg total) by mouth every 6 (six) hours as needed for mild pain, fever or headache. What changed:  how much to take when to take this   amiodarone  200 MG tablet Commonly known as: PACERONE  Take 1 tablet (200 mg total) by mouth daily.   carboxymethylcellulose 0.5 % Soln Commonly known as: REFRESH PLUS Place 1 drop into both eyes 3 (three) times daily as needed (dry/irritated eyes.).   docusate sodium  100 MG capsule Commonly known as: COLACE Take 1 capsule (100 mg total) by mouth daily. What changed:  how much to take when to take this   gabapentin  100 MG capsule Commonly known as: NEURONTIN  Take 1 capsule (100 mg total) by mouth at bedtime. What changed: how much to take   haloperidol 2 MG/ML solution Commonly known as: HALDOL Place 0.3 mLs (0.6 mg total) under the tongue every  6 (six) hours as needed for agitation (or delirium).   latanoprost  0.005 % ophthalmic solution Commonly known as: XALATAN  Place 1 drop into the left eye at bedtime.   levothyroxine  125 MCG tablet Commonly known as: SYNTHROID  Take 1 tablet (125 mcg total) by mouth daily.   morphine  CONCENTRATE 10 mg / 0.5 ml concentrated solution Take 0.25 mLs (5 mg total) by mouth every 3 (three) hours as needed for moderate pain (pain score 4-6) or severe pain (pain score 7-10) (or dyspnea).   pantoprazole  40 MG tablet Commonly known as: PROTONIX  Take 1 tablet (40 mg total) by mouth 2 (two) times daily before a meal.   scopolamine 1 MG/3DAYS Commonly known as: TRANSDERM-SCOP Place 1 patch (1.5 mg total) onto the skin every 3 (three) days.   simethicone  125 MG chewable tablet Commonly known as: MYLICON Chew 125 mg by mouth every 6 (six) hours as needed for flatulence.        Follow-up Information     [facility]. Go on 07/23/2023.   Specialty: Cardiology Why: Hospital Follow-Up 07/23/2023 @ 11:45 [facility] -Entrance C off of [address] Near the [facility] Free Valet Parking at the door or use Gate Code [phone] to park under the building Please bring all medications to follow-up appointment  with you. Contact information: [address] [place]  [phone]               Discharge Exam: Filed Weights   07/14/23 0452 07/15/23 0500 07/16/23 0500  Weight: 59.9 kg 57.3 kg 55.1 kg   General exam: Appears calm and comfortable, fatigued Respiratory system: Clear to auscultation. Respiratory effort normal. Cardiovascular system: S1 & S2 heard, RRR.  Gastrointestinal system: Abdomen is soft Central nervous system: Alert and awake Extremities: No edema Skin: No significant lesions noted Psychiatry: Flat affect.    Condition at discharge: Stable.  The results of significant  diagnostics from this hospitalization (including imaging, microbiology, ancillary and laboratory) are listed below for reference.   Imaging Studies: US  Abdomen Limited RUQ (LIVER/GB) Result Date: 07/15/2023 CLINICAL DATA:  [id number] Transaminitis [id number] EXAM: ULTRASOUND ABDOMEN LIMITED RIGHT UPPER QUADRANT COMPARISON:  CT 04/06/2023 FINDINGS: Gallbladder: No gallstones or wall thickening visualized. Small volume sludge layering posteriorly. No sonographic Murphy sign noted by sonographer. Wall thickness 2.7 mm. Common bile duct: Diameter: 3.3 mm.  No intrahepatic biliary ductal dilatation. Liver: No focal lesion identified. Within normal limits in parenchymal echogenicity. Portal vein is patent on color Doppler imaging with normal direction of blood flow towards the liver. Other: Small right pleural effusion. IMPRESSION: 1. Small volume gallbladder sludge. No gallstones or biliary dilatation. 2. Small right pleural effusion. Electronically Signed   By: JONETTA Faes M.D.   On: 07/15/2023 11:09   ECHOCARDIOGRAM COMPLETE Result Date: 07/12/2023    ECHOCARDIOGRAM REPORT   Patient Name:   KENDAL GHAZARIAN Date of Exam: 07/12/2023 Medical Rec #:  [id number]        Height:       63.0 in Accession #:    [id number]       Weight:       140.2 lb Date of Birth:  05-16-1931        BSA:          1.663 m Patient Age:    89 years         BP:           138/66 mmHg Patient Gender: F                HR:           58 bpm. Exam Location:  [facility] Procedure: 2D Echo, Cardiac Doppler and Color Doppler (Both Spectral and Color            Flow Doppler were utilized during procedure). Indications:    CHF, Acute diastolic  History:        Patient has prior history of Echocardiogram examinations. CHF;                 Risk Factors:Hypertension.  Sonographer:    Vella Key Referring Phys: [id number] OLADAPO ADEFESO IMPRESSIONS  1. Septal dyskinesis; Hypokinesis worse in the inferior, distal anterior, lateral and apical walls. Left ventricular  ejection fraction, by estimation, is 35%. The left ventricle has moderately decreased function. Left ventricular diastolic parameters are indeterminate.  2. Right ventricular systolic function is normal. The right ventricular size is mildly enlarged.  3. Left atrial size was severely dilated.  4. Right atrial size was severely dilated.  5. Mild to moderate mitral valve regurgitation.  6. Tricuspid valve regurgitation is moderate.  7. The aortic valve is tricuspid. Aortic valve regurgitation is mild. Aortic valve sclerosis/calcification is present, without any evidence of aortic stenosis.  8. The inferior vena  cava is normal in size with <50% respiratory variability, suggesting right atrial pressure of 8 mmHg. Comparison(s): The left ventricular function is worsened. FINDINGS  Left Ventricle: Septal dyskinesis; Hypokinesis worse in the inferior, distal anterior, lateral and apical walls. Left ventricular ejection fraction, by estimation, is 35%. The left ventricle has moderately decreased function. The left ventricular internal cavity size was normal in size. There is no left ventricular hypertrophy. Left ventricular diastolic parameters are indeterminate. Right Ventricle: The right ventricular size is mildly enlarged. Right vetricular wall thickness was not assessed. Right ventricular systolic function is normal. Left Atrium: Left atrial size was severely dilated. Right Atrium: Right atrial size was severely dilated. Pericardium: There is no evidence of pericardial effusion. Mitral Valve: There is mild thickening of the mitral valve leaflet(s). Mild mitral annular calcification. Mild to moderate mitral valve regurgitation. Tricuspid Valve: The tricuspid valve is normal in structure. Tricuspid valve regurgitation is moderate. Aortic Valve: The aortic valve is tricuspid. Aortic valve regurgitation is mild. Aortic valve sclerosis/calcification is present, without any evidence of aortic stenosis. Pulmonic Valve: The  pulmonic valve was normal in structure. Pulmonic valve regurgitation is mild. Aorta: The aortic root and ascending aorta are structurally normal, with no evidence of dilitation. Venous: The inferior vena cava is normal in size with less than 50% respiratory variability, suggesting right atrial pressure of 8 mmHg. IAS/Shunts: No atrial level shunt detected by color flow Doppler. Additional Comments: A device lead is visualized.  LEFT VENTRICLE PLAX 2D LVIDd:         4.70 cm LVIDs:         3.70 cm LV PW:         1.10 cm LV IVS:        1.10 cm LVOT diam:     1.90 cm LV SV:         48 LV SV Index:   29 LVOT Area:     2.84 cm  LV Volumes (MOD) LV vol d, MOD A2C: 125.5 ml LV vol d, MOD A4C: 105.5 ml LV vol s, MOD A2C: 73.3 ml LV vol s, MOD A4C: 57.6 ml LV SV MOD A2C:     52.2 ml LV SV MOD A4C:     105.5 ml LV SV MOD BP:      47.7 ml RIGHT VENTRICLE RV Basal diam:  4.70 cm RV S prime:     8.67 cm/s TAPSE (M-mode): 2.5 cm LEFT ATRIUM              Index        RIGHT ATRIUM           Index LA diam:        4.30 cm  2.59 cm/m   RA Area:     26.50 cm LA Vol (A2C):   132.0 ml 79.39 ml/m  RA Volume:   107.00 ml 64.35 ml/m LA Vol (A4C):   119.0 ml 71.57 ml/m LA Biplane Vol: 135.0 ml 81.19 ml/m  AORTIC VALVE LVOT Vmax:   80.00 cm/s LVOT Vmean:  56.600 cm/s LVOT VTI:    0.168 m  AORTA Ao Root diam: 3.40 cm Ao Asc diam:  3.40 cm MITRAL VALVE               TRICUSPID VALVE MV Area (PHT): 4.12 cm    TV Peak grad:   28.1 mmHg MV Decel Time: 184 msec    TV Vmax:        2.65 m/s MV E velocity:  72.00 cm/s  TR Peak grad:   28.1 mmHg MV A velocity: 54.40 cm/s  TR Vmax:        265.00 cm/s MV E/A ratio:  1.32                            SHUNTS                            Systemic VTI:  0.17 m                            Systemic Diam: 1.90 cm Vina Gull MD Electronically signed by Vina Gull MD Signature Date/Time: 07/12/2023/4:31:31 PM    Final    DG Chest Port 1 View Result Date: 07/11/2023 CLINICAL DATA:  Chest pain EXAM: PORTABLE  CHEST 1 VIEW COMPARISON:  Chest x-ray 12/09/2021 FINDINGS: Left-sided ICD present. The heart is enlarged the aorta is tortuous, unchanged. There central pulmonary vascular congestion and central interstitial prominence. There some mild patchy airspace opacities in the lower lungs and a small left pleural effusion. There is no pneumothorax or acute fracture. IMPRESSION: 1. Cardiomegaly with central pulmonary vascular congestion and interstitial edema. 2. Mild patchy airspace opacities in the lower lungs and small left pleural effusion. Electronically Signed   By: Greig Pique M.D.   On: 07/11/2023 21:22   Intravitreal Injection, Pharmacologic Agent - OD - Right Eye Result Date: 07/01/2023 Time Out 06/22/2023. 12:45 PM. Confirmed correct patient, procedure, site, and patient consented. Anesthesia Topical anesthesia was used. Anesthetic medications included Lidocaine  2%, Proparacaine 0.5%. Procedure Preparation included 5% betadine  to ocular surface, eyelid speculum. A (32g) needle was used. Injection: 2 mg aflibercept  2 MG/0.05ML   Route: Intravitreal, Site: Right Eye   NDC: Q956576, Lot: [id number], Expiration date: 09/15/2024, Waste: 0 mL Post-op Post injection exam found visual acuity of at least counting fingers. The patient tolerated the procedure well. There were no complications. The patient received written and verbal post procedure care education. Post injection medications were not given.   OCT, Retina - OU - Both Eyes Result Date: 07/01/2023 Right Eye Quality was good. Central Foveal Thickness: 273. Progression has improved. Findings include normal foveal contour, no IRF, no SRF, retinal drusen , subretinal hyper-reflective material, pigment epithelial detachment, outer retinal atrophy (Central ORA with scattered PED / [facility] -- PED/SRHM nasal fovea and PED temporal macula -- slightly improved). Left Eye Quality was good. Central Foveal Thickness: 271. Progression has been stable. Findings include  normal foveal contour, no IRF, no SRF, retinal drusen , subretinal hyper-reflective material, outer retinal atrophy (Central ORA with SRHM; no fluid). Notes *Images captured and stored on drive Diagnosis / Impression: OD: Exudative ARMD - Central ORA with scattered PED / [facility] -- PED/SRHM nasal fovea and PED temporal macula -- slightly improved OS: Nonexudative ARMD - Central ORA with SRHM; no fluid Clinical management: See below Abbreviations: NFP - Normal foveal profile. CME - cystoid macular edema. PED - pigment epithelial detachment. IRF - intraretinal fluid. SRF - subretinal fluid. EZ - ellipsoid zone. ERM - epiretinal membrane. ORA - outer retinal atrophy. ORT - outer retinal tubulation. SRHM - subretinal hyper-reflective material. IRHM - intraretinal hyper-reflective material    Microbiology: Results for orders placed or performed during the hospital encounter of 07/11/23  MRSA Next Gen by PCR, Nasal     Status: None   Collection Time:  07/11/23 11:50 PM   Specimen: Nasal Mucosa; Nasal Swab  Result Value Ref Range Status   MRSA by PCR Next Gen NOT DETECTED NOT DETECTED Final    Comment: (NOTE) The GeneXpert MRSA Assay (FDA approved for NASAL specimens only), is one component of a comprehensive MRSA colonization surveillance program. It is not intended to diagnose MRSA infection nor to guide or monitor treatment for MRSA infections. Test performance is not FDA approved in patients less than 68 years old. Performed at [facility], [address], [place], [place] [address]   Urine Culture     Status: Abnormal   Collection Time: 07/12/23  7:21 AM   Specimen: Urine, Clean Catch  Result Value Ref Range Status   Specimen Description   Final    URINE, CLEAN CATCH Performed at [facility], [address], [place], [place] [address]    Special Requests   Final    NONE Reflexed from [id number] Performed at [facility], [address], [place], [place] [address]    Culture (A)  Final     60,000 COLONIES/mL ESCHERICHIA COLI >=100,000 COLONIES/mL LACTOBACILLUS SPECIES Standardized susceptibility testing for this organism is not available. Performed at [facility], [address], [place], [place] [address]    Report Status 07/16/2023 FINAL  Final   Organism ID, Bacteria ESCHERICHIA COLI (A)  Final      Susceptibility   Escherichia coli - MIC*    AMPICILLIN  >=32 RESISTANT Resistant     CEFAZOLIN  16 SENSITIVE Sensitive     CEFEPIME <=0.12 SENSITIVE Sensitive     CEFTRIAXONE  <=0.25 SENSITIVE Sensitive     CIPROFLOXACIN  <=0.25 SENSITIVE Sensitive     GENTAMICIN  <=1 SENSITIVE Sensitive     IMIPENEM <=0.25 SENSITIVE Sensitive     NITROFURANTOIN  <=16 SENSITIVE Sensitive     TRIMETH /SULFA  >=320 RESISTANT Resistant     AMPICILLIN /SULBACTAM >=32 RESISTANT Resistant     PIP/TAZO 32 INTERMEDIATE Intermediate ug/mL    * 60,000 COLONIES/mL ESCHERICHIA COLI   *Note: Due to a large number of results and/or encounters for the requested time period, some results have not been displayed. A complete set of results can be found in Results Review.    Labs: CBC: Recent Labs  Lab 07/12/23 0551 07/13/23 0752 07/14/23 0239 07/15/23 0352 07/16/23 0352  WBC 6.6 13.7* 9.6 7.2 6.7  HGB 8.5* 8.3* 8.1* 8.3* 8.3*  HCT 25.0* 24.8* 25.2* 25.6* 25.1*  MCV 81.2 82.4 80.8 81.5 81.2  PLT 246 254 268 279 313   Basic Metabolic Panel: Recent Labs  Lab 07/12/23 0551 07/12/23 1153 07/13/23 0752 07/13/23 1052 07/14/23 0239 07/15/23 0352 07/16/23 0352  NA 120*   < > 128* 127* 131* 129* 133*  K 3.8  --  3.9  --  3.2* 3.9 2.7*  CL 89*  --  95*  --  97* 95* 95*  CO2 22  --  22  --  24 24 27   GLUCOSE 146*  --  124*  --  132* 131* 131*  BUN 16  --  14  --  16 18 16   CREATININE 1.11*  --  0.99  --  1.02* 0.93 0.93  CALCIUM  8.6*  --  8.2*  --  8.0* 8.1* 8.0*  MG 1.7  --  1.9  --  1.8 1.7 1.6*  PHOS 3.7  --   --   --   --   --   --    < > = values in this  interval not displayed.   Liver  Function Tests: Recent Labs  Lab 07/11/23 2046 07/12/23 0551 07/14/23 0239 07/15/23 0352 07/16/23 0352  AST 103* 112* 161* 326* 273*  ALT 80* 79* 108* 202* 208*  ALKPHOS 80 74 71 75 72  BILITOT 0.6 0.6 0.7 0.7 0.9  PROT 6.5 6.1* 5.7* 5.8* 5.5*  ALBUMIN 3.3* 3.1* 2.7* 2.8* 2.7*   CBG: Recent Labs  Lab 07/15/23 1108 07/15/23 1613 07/15/23 1954 07/16/23 0719 07/16/23 1122  GLUCAP 178* 193* 117* 141* 144*    Discharge time spent: greater than 30 minutes.  Signed: Eric Nunnery, MD [facility] 07/17/2023

## 2023-07-17 NOTE — Plan of Care (Signed)
   Problem: Education: Goal: Knowledge of General Education information will improve Description: Including pain rating scale, medication(s)/side effects and non-pharmacologic comfort measures Outcome: Progressing   Problem: Clinical Measurements: Goal: Will remain free from infection Outcome: Progressing   Problem: Coping: Goal: Level of anxiety will decrease Outcome: Progressing

## 2023-07-17 NOTE — Plan of Care (Signed)
  Problem: Education: Goal: Knowledge of General Education information will improve Description: Including pain rating scale, medication(s)/side effects and non-pharmacologic comfort measures Outcome: Progressing   Problem: Health Behavior/Discharge Planning: Goal: Ability to manage health-related needs will improve Outcome: Progressing   Problem: Clinical Measurements: Goal: Ability to maintain clinical measurements within normal limits will improve Outcome: Progressing Goal: Will remain free from infection Outcome: Progressing Goal: Diagnostic test results will improve Outcome: Progressing Goal: Respiratory complications will improve Outcome: Progressing Goal: Cardiovascular complication will be avoided Outcome: Progressing   Problem: Activity: Goal: Risk for activity intolerance will decrease Outcome: Progressing   Problem: Nutrition: Goal: Adequate nutrition will be maintained Outcome: Progressing   Problem: Coping: Goal: Level of anxiety will decrease Outcome: Progressing   Problem: Elimination: Goal: Will not experience complications related to bowel motility Outcome: Progressing Goal: Will not experience complications related to urinary retention Outcome: Progressing   Problem: Pain Managment: Goal: General experience of comfort will improve and/or be controlled Outcome: Progressing   Problem: Safety: Goal: Ability to remain free from injury will improve Outcome: Progressing   Problem: Skin Integrity: Goal: Risk for impaired skin integrity will decrease Outcome: Progressing   Problem: Education: Goal: Ability to describe self-care measures that may prevent or decrease complications (Diabetes Survival Skills Education) will improve Outcome: Progressing Goal: Individualized Educational Video(s) Outcome: Progressing   Problem: Coping: Goal: Ability to adjust to condition or change in health will improve Outcome: Progressing   Problem: Fluid  Volume: Goal: Ability to maintain a balanced intake and output will improve Outcome: Progressing   Problem: Health Behavior/Discharge Planning: Goal: Ability to identify and utilize available resources and services will improve Outcome: Progressing Goal: Ability to manage health-related needs will improve Outcome: Progressing   Problem: Metabolic: Goal: Ability to maintain appropriate glucose levels will improve Outcome: Progressing   Problem: Nutritional: Goal: Maintenance of adequate nutrition will improve Outcome: Progressing Goal: Progress toward achieving an optimal weight will improve Outcome: Progressing   Problem: Skin Integrity: Goal: Risk for impaired skin integrity will decrease Outcome: Progressing   Problem: Tissue Perfusion: Goal: Adequacy of tissue perfusion will improve Outcome: Progressing   Problem: Education: Goal: Knowledge of the prescribed therapeutic regimen will improve Outcome: Progressing   Problem: Coping: Goal: Ability to identify and develop effective coping behavior will improve Outcome: Progressing   Problem: Clinical Measurements: Goal: Quality of life will improve Outcome: Progressing   Problem: Respiratory: Goal: Verbalizations of increased ease of respirations will increase Outcome: Progressing   Problem: Role Relationship: Goal: Family's ability to cope with current situation will improve Outcome: Progressing Goal: Ability to verbalize concerns, feelings, and thoughts to partner or family member will improve Outcome: Progressing   Problem: Pain Management: Goal: Satisfaction with pain management regimen will improve Outcome: Progressing

## 2023-07-17 NOTE — TOC Transition Note (Signed)
 Transition of Care [facility]) - Discharge Note   Patient Details  Name: [name] MRN: [id number] Date of Birth: 11-24-1931  [facility]) CM/SW Contact:  Hoy DELENA Bigness, LCSW Phone Number: 07/17/2023, 3:31 PM   Clinical Narrative:    Pt to return home with comfort care/hospice services provided by [facility]. [facility] to increase pt's caregiver support when she returns home. VM left for pt's son/POA Ronnie to inform of discharge. Spoke with pt's daughter, Rilla and confirmed discharge plan. Transport has been scheduled through [facility]. TOC signing off.    Final next level of care: Home w Hospice Care Barriers to Discharge: Barriers Resolved   Patient Goals and CMS Choice Patient states their goals for this hospitalization and ongoing recovery are:: return back home CMS Medicare.gov Compare Post Acute Care list provided to:: Patient Represenative (must comment) Choice offered to / list presented to : Adult Children      Discharge Placement                       Discharge Plan and Services Additional resources added to the After Visit Summary for   In-house Referral: Clinical Social Work   Post Acute Care Choice: Durable Medical Equipment          DME Arranged: N/A           HH Agency: NA        Social Drivers of Health (SDOH) Interventions SDOH Screenings   Food Insecurity: No Food Insecurity (07/12/2023)  Housing: Low Risk  (07/12/2023)  Transportation Needs: No Transportation Needs (07/12/2023)  Utilities: Not At Risk (07/12/2023)  Alcohol  Screen: Low Risk  (09/27/2020)  Depression (PHQ2-9): Low Risk  (07/11/2021)  Financial Resource Strain: Medium Risk (07/02/2023)   Received from [facility]  Physical Activity: Inactive (03/15/2021)  Social Connections: Moderately Isolated (09/27/2020)  Stress: Stress Concern Present (03/15/2021)  Tobacco Use: Low Risk  (07/12/2023)     Readmission Risk Interventions    07/16/2023    3:36 PM  07/12/2023   12:13 PM  Readmission Risk Prevention Plan  Transportation Screening Complete Complete  PCP or Specialist Appt within 3-5 Days Complete   HRI or Home Care Consult Complete Complete  Social Work Consult for Recovery Care Planning/Counseling Complete Complete  Palliative Care Screening Complete Not Applicable  Medication Review [employer]) Complete Complete

## 2023-07-18 ENCOUNTER — Encounter: Payer: Self-pay | Admitting: Cardiovascular Disease

## 2023-07-23 ENCOUNTER — Encounter (HOSPITAL_COMMUNITY)

## 2023-07-25 ENCOUNTER — Encounter (INDEPENDENT_AMBULATORY_CARE_PROVIDER_SITE_OTHER): Payer: Medicare (Managed Care) | Admitting: Ophthalmology

## 2023-07-25 DIAGNOSIS — Z961 Presence of intraocular lens: Secondary | ICD-10-CM

## 2023-07-25 DIAGNOSIS — I10 Essential (primary) hypertension: Secondary | ICD-10-CM

## 2023-07-25 DIAGNOSIS — H353124 Nonexudative age-related macular degeneration, left eye, advanced atrophic with subfoveal involvement: Secondary | ICD-10-CM

## 2023-07-25 DIAGNOSIS — H35033 Hypertensive retinopathy, bilateral: Secondary | ICD-10-CM

## 2023-07-25 DIAGNOSIS — H353211 Exudative age-related macular degeneration, right eye, with active choroidal neovascularization: Secondary | ICD-10-CM

## 2023-07-31 ENCOUNTER — Ambulatory Visit: Admitting: Gastroenterology

## 2023-08-02 NOTE — Progress Notes (Signed)
 Heart Failure Navigator Progress Note  Navigator was notified that family moved patient to comfort care and defibrillator was turned off. Family wanted to make sure the [facility] Failure [facility] was aware so they would not have to schedule an appointment.It looks like the appointment had already been cancelled for this patient in [place].  Navigator will sign off at this time.   Charmaine Pines, RN, BSN [facility] Heart Failure Navigator Secure Chat Only

## 2023-09-10 ENCOUNTER — Ambulatory Visit: Payer: Medicare (Managed Care) | Admitting: Podiatry

## 2023-09-17 DEATH — deceased

## 2023-09-19 ENCOUNTER — Telehealth: Payer: Self-pay | Admitting: Emergency Medicine

## 2023-09-19 NOTE — Telephone Encounter (Signed)
 Left message that a TSH is overdue. Informed that she can go to any [facility] to have this drawn. Left call back number.
